# Patient Record
Sex: Female | Born: 1940 | Race: White | Hispanic: No | Smoking: Never smoker
Health system: Southern US, Community
[De-identification: ages and names within clinical notes are randomized; demographics above are authoritative.]

## PROBLEM LIST (undated history)

## (undated) DIAGNOSIS — T7840XA Allergy, unspecified, initial encounter: Secondary | ICD-10-CM

## (undated) DIAGNOSIS — K5790 Diverticulosis of intestine, part unspecified, without perforation or abscess without bleeding: Secondary | ICD-10-CM

## (undated) DIAGNOSIS — R112 Nausea with vomiting, unspecified: Secondary | ICD-10-CM

## (undated) DIAGNOSIS — Z8679 Personal history of other diseases of the circulatory system: Secondary | ICD-10-CM

## (undated) DIAGNOSIS — I10 Essential (primary) hypertension: Secondary | ICD-10-CM

## (undated) DIAGNOSIS — E559 Vitamin D deficiency, unspecified: Secondary | ICD-10-CM

## (undated) DIAGNOSIS — I6529 Occlusion and stenosis of unspecified carotid artery: Secondary | ICD-10-CM

## (undated) DIAGNOSIS — K635 Polyp of colon: Secondary | ICD-10-CM

## (undated) DIAGNOSIS — E039 Hypothyroidism, unspecified: Secondary | ICD-10-CM

## (undated) DIAGNOSIS — M797 Fibromyalgia: Secondary | ICD-10-CM

## (undated) DIAGNOSIS — M351 Other overlap syndromes: Secondary | ICD-10-CM

## (undated) DIAGNOSIS — M3500 Sicca syndrome, unspecified: Secondary | ICD-10-CM

## (undated) DIAGNOSIS — R7689 Other specified abnormal immunological findings in serum: Secondary | ICD-10-CM

## (undated) DIAGNOSIS — M3390 Dermatopolymyositis, unspecified, organ involvement unspecified: Secondary | ICD-10-CM

## (undated) DIAGNOSIS — M25519 Pain in unspecified shoulder: Secondary | ICD-10-CM

## (undated) DIAGNOSIS — R0989 Other specified symptoms and signs involving the circulatory and respiratory systems: Secondary | ICD-10-CM

## (undated) DIAGNOSIS — H409 Unspecified glaucoma: Secondary | ICD-10-CM

## (undated) DIAGNOSIS — K219 Gastro-esophageal reflux disease without esophagitis: Secondary | ICD-10-CM

## (undated) DIAGNOSIS — M3313 Other dermatomyositis without myopathy: Secondary | ICD-10-CM

## (undated) DIAGNOSIS — M349 Systemic sclerosis, unspecified: Secondary | ICD-10-CM

## (undated) DIAGNOSIS — K279 Peptic ulcer, site unspecified, unspecified as acute or chronic, without hemorrhage or perforation: Secondary | ICD-10-CM

## (undated) DIAGNOSIS — G4730 Sleep apnea, unspecified: Secondary | ICD-10-CM

## (undated) DIAGNOSIS — Z8719 Personal history of other diseases of the digestive system: Secondary | ICD-10-CM

## (undated) DIAGNOSIS — J189 Pneumonia, unspecified organism: Secondary | ICD-10-CM

## (undated) DIAGNOSIS — Z8619 Personal history of other infectious and parasitic diseases: Secondary | ICD-10-CM

## (undated) DIAGNOSIS — Z87440 Personal history of urinary (tract) infections: Secondary | ICD-10-CM

## (undated) DIAGNOSIS — I7300 Raynaud's syndrome without gangrene: Secondary | ICD-10-CM

## (undated) DIAGNOSIS — Z8701 Personal history of pneumonia (recurrent): Secondary | ICD-10-CM

## (undated) DIAGNOSIS — Z86718 Personal history of other venous thrombosis and embolism: Secondary | ICD-10-CM

## (undated) DIAGNOSIS — Z9889 Other specified postprocedural states: Secondary | ICD-10-CM

## (undated) DIAGNOSIS — M1990 Unspecified osteoarthritis, unspecified site: Secondary | ICD-10-CM

## (undated) DIAGNOSIS — E119 Type 2 diabetes mellitus without complications: Secondary | ICD-10-CM

## (undated) DIAGNOSIS — Z8489 Family history of other specified conditions: Secondary | ICD-10-CM

## (undated) DIAGNOSIS — I509 Heart failure, unspecified: Secondary | ICD-10-CM

## (undated) DIAGNOSIS — E785 Hyperlipidemia, unspecified: Secondary | ICD-10-CM

## (undated) DIAGNOSIS — R768 Other specified abnormal immunological findings in serum: Secondary | ICD-10-CM

## (undated) HISTORY — DX: Hyperlipidemia, unspecified: E78.5

## (undated) HISTORY — PX: TONSILLECTOMY AND ADENOIDECTOMY: SHX28

## (undated) HISTORY — DX: Allergy, unspecified, initial encounter: T78.40XA

## (undated) HISTORY — DX: Sleep apnea, unspecified: G47.30

## (undated) HISTORY — DX: Hypothyroidism, unspecified: E03.9

## (undated) HISTORY — DX: Peptic ulcer, site unspecified, unspecified as acute or chronic, without hemorrhage or perforation: K27.9

## (undated) HISTORY — DX: Pneumonia, unspecified organism: J18.9

## (undated) HISTORY — PX: JOINT REPLACEMENT: SHX530

## (undated) HISTORY — DX: Pain in unspecified shoulder: M25.519

## (undated) HISTORY — DX: Unspecified glaucoma: H40.9

## (undated) HISTORY — DX: Personal history of pneumonia (recurrent): Z87.01

## (undated) HISTORY — DX: Personal history of other venous thrombosis and embolism: Z86.718

## (undated) HISTORY — DX: Personal history of other infectious and parasitic diseases: Z86.19

## (undated) HISTORY — DX: Personal history of other diseases of the circulatory system: Z86.79

## (undated) HISTORY — DX: Fibromyalgia: M79.7

## (undated) HISTORY — DX: Personal history of other diseases of the digestive system: Z87.19

## (undated) HISTORY — DX: Personal history of urinary (tract) infections: Z87.440

## (undated) HISTORY — DX: Heart failure, unspecified: I50.9

## (undated) HISTORY — DX: Polyp of colon: K63.5

## (undated) HISTORY — DX: Occlusion and stenosis of unspecified carotid artery: I65.29

## (undated) HISTORY — DX: Diverticulosis of intestine, part unspecified, without perforation or abscess without bleeding: K57.90

## (undated) HISTORY — DX: Unspecified osteoarthritis, unspecified site: M19.90

## (undated) HISTORY — DX: Gastro-esophageal reflux disease without esophagitis: K21.9

## (undated) HISTORY — PX: TONSILLECTOMY: SUR1361

## (undated) HISTORY — DX: Other specified symptoms and signs involving the circulatory and respiratory systems: R09.89

## (undated) HISTORY — DX: Type 2 diabetes mellitus without complications: E11.9

## (undated) HISTORY — DX: Vitamin D deficiency, unspecified: E55.9

## (undated) HISTORY — PX: TUBAL LIGATION: SHX77

---

## 1976-09-26 HISTORY — PX: ABDOMINAL HYSTERECTOMY: SHX81

## 1984-09-26 DIAGNOSIS — Z8679 Personal history of other diseases of the circulatory system: Secondary | ICD-10-CM

## 1984-09-26 HISTORY — DX: Personal history of other diseases of the circulatory system: Z86.79

## 1998-09-26 HISTORY — PX: CARDIAC CATHETERIZATION: SHX172

## 2007-03-27 HISTORY — PX: COLONOSCOPY WITH ESOPHAGOGASTRODUODENOSCOPY (EGD): SHX5779

## 2011-09-27 HISTORY — PX: PARTIAL HIP ARTHROPLASTY: SHX733

## 2011-10-28 HISTORY — PX: COLONOSCOPY: SHX174

## 2012-01-06 DIAGNOSIS — R768 Other specified abnormal immunological findings in serum: Secondary | ICD-10-CM | POA: Insufficient documentation

## 2012-02-27 DIAGNOSIS — M545 Low back pain, unspecified: Secondary | ICD-10-CM | POA: Insufficient documentation

## 2012-04-26 LAB — HM DEXA SCAN: HM Dexa Scan: NORMAL

## 2012-08-22 DIAGNOSIS — G4730 Sleep apnea, unspecified: Secondary | ICD-10-CM | POA: Insufficient documentation

## 2012-08-22 DIAGNOSIS — G4733 Obstructive sleep apnea (adult) (pediatric): Secondary | ICD-10-CM | POA: Insufficient documentation

## 2012-09-26 DIAGNOSIS — Z8701 Personal history of pneumonia (recurrent): Secondary | ICD-10-CM

## 2012-09-26 HISTORY — DX: Personal history of pneumonia (recurrent): Z87.01

## 2012-10-17 LAB — HM COLONOSCOPY: HM Colonoscopy: NORMAL

## 2012-11-17 LAB — HM MAMMOGRAPHY: HM MAMMO: NORMAL

## 2014-03-06 LAB — HM DIABETES EYE EXAM

## 2014-07-17 ENCOUNTER — Ambulatory Visit (INDEPENDENT_AMBULATORY_CARE_PROVIDER_SITE_OTHER): Payer: 59 | Admitting: Internal Medicine

## 2014-07-17 ENCOUNTER — Encounter: Payer: Self-pay | Admitting: Internal Medicine

## 2014-07-17 ENCOUNTER — Encounter (INDEPENDENT_AMBULATORY_CARE_PROVIDER_SITE_OTHER): Payer: Self-pay

## 2014-07-17 VITALS — BP 138/62 | HR 89 | Temp 98.0°F | Ht 64.75 in | Wt 161.8 lb

## 2014-07-17 DIAGNOSIS — E039 Hypothyroidism, unspecified: Secondary | ICD-10-CM

## 2014-07-17 DIAGNOSIS — M25511 Pain in right shoulder: Secondary | ICD-10-CM

## 2014-07-17 DIAGNOSIS — E119 Type 2 diabetes mellitus without complications: Secondary | ICD-10-CM

## 2014-07-17 DIAGNOSIS — M79621 Pain in right upper arm: Secondary | ICD-10-CM

## 2014-07-17 DIAGNOSIS — G4730 Sleep apnea, unspecified: Secondary | ICD-10-CM

## 2014-07-17 DIAGNOSIS — F4323 Adjustment disorder with mixed anxiety and depressed mood: Secondary | ICD-10-CM | POA: Insufficient documentation

## 2014-07-17 DIAGNOSIS — Z1239 Encounter for other screening for malignant neoplasm of breast: Secondary | ICD-10-CM

## 2014-07-17 DIAGNOSIS — M329 Systemic lupus erythematosus, unspecified: Secondary | ICD-10-CM

## 2014-07-17 DIAGNOSIS — M545 Low back pain: Secondary | ICD-10-CM

## 2014-07-17 DIAGNOSIS — E063 Autoimmune thyroiditis: Secondary | ICD-10-CM | POA: Insufficient documentation

## 2014-07-17 DIAGNOSIS — M79629 Pain in unspecified upper arm: Secondary | ICD-10-CM | POA: Insufficient documentation

## 2014-07-17 DIAGNOSIS — I10 Essential (primary) hypertension: Secondary | ICD-10-CM

## 2014-07-17 DIAGNOSIS — E038 Other specified hypothyroidism: Secondary | ICD-10-CM | POA: Insufficient documentation

## 2014-07-17 LAB — MICROALBUMIN / CREATININE URINE RATIO
Creatinine,U: 79.7 mg/dL
MICROALB UR: 0.8 mg/dL (ref 0.0–1.9)
Microalb Creat Ratio: 1 mg/g (ref 0.0–30.0)

## 2014-07-17 LAB — HEMOGLOBIN A1C: HEMOGLOBIN A1C: 8 % — AB (ref 4.6–6.5)

## 2014-07-17 LAB — HM PAP SMEAR

## 2014-07-17 NOTE — Assessment & Plan Note (Signed)
Will request notes from [place].

## 2014-07-17 NOTE — Assessment & Plan Note (Signed)
Right axillary pain and fullness. Will set up bilateral diagnostic mammogram and right breast US.

## 2014-07-17 NOTE — Assessment & Plan Note (Signed)
BP Readings from Last 3 Encounters:  07/17/14 138/62   BP well controlled on Diovan. Will continue. Renal function with labs today.

## 2014-07-17 NOTE — Progress Notes (Signed)
Subjective:    Patient ID: Cassandra Benson, female    DOB: 1941/05/01, 73 y.o.   MRN: [id number]  HPI [name] presents to establish care.  DM - Followed at [facility]. Wears insulin pump. Last A1c was 6.2%. Recently had to stopped glucophage for a short time because of diarrhea.  Difficult time for her. Grandson committed suicide. Husband was also recently injured after a fall.  Also concerned about pain under right armpit. Unsure how long present. Hurts to lay arm flat. No nodular areas in breast noted. Last mammogram was 2014 and was normal per pt.  Review of Systems  Constitutional: Negative for fever, chills, appetite change, fatigue and unexpected weight change.  Eyes: Negative for visual disturbance.  Respiratory: Negative for shortness of breath.   Cardiovascular: Negative for chest pain and leg swelling.  Gastrointestinal: Negative for nausea, vomiting, abdominal pain, diarrhea and constipation.  Musculoskeletal: Positive for arthralgias, back pain and myalgias.  Skin: Negative for color change and rash.  Hematological: Negative for adenopathy. Does not bruise/bleed easily.  Psychiatric/Behavioral: Positive for dysphoric mood. Negative for suicidal ideas and sleep disturbance. The patient is not nervous/anxious.        Objective:    BP 138/62  Pulse 89  Temp(Src) 98 F (36.7 C) (Oral)  Ht 5' 4.75" (1.645 m)  Wt 161 lb 12 oz (73.369 kg)  BMI 27.11 kg/m2  SpO2 95% Physical Exam  Constitutional: She is oriented to person, place, and time. She appears well-developed and well-nourished. No distress.  HENT:  Head: Normocephalic and atraumatic.  Right Ear: External ear normal.  Left Ear: External ear normal.  Nose: Nose normal.  Mouth/Throat: Oropharynx is clear and moist. No oropharyngeal exudate.  Eyes: Conjunctivae are normal. Pupils are equal, round, and reactive to light. Right eye exhibits no discharge. Left eye exhibits no discharge. No scleral icterus.  Neck:  Normal range of motion. Neck supple. No tracheal deviation present. No thyromegaly present.  Cardiovascular: Normal rate, regular rhythm, normal heart sounds and intact distal pulses.  Exam reveals no gallop and no friction rub.   No murmur heard. Pulmonary/Chest: Effort normal and breath sounds normal. No accessory muscle usage. Not tachypneic. No respiratory distress. She has no decreased breath sounds. She has no wheezes. She has no rales. She exhibits no tenderness. Right breast exhibits tenderness (right axilla with generalized fullness noted). Right breast exhibits no inverted nipple, no mass, no nipple discharge and no skin change.    Abdominal: Soft. Bowel sounds are normal. She exhibits no distension and no mass. There is no tenderness. There is no rebound and no guarding.  Musculoskeletal: Normal range of motion. She exhibits no edema and no tenderness.  Lymphadenopathy:    She has no cervical adenopathy.  Neurological: She is alert and oriented to person, place, and time. No cranial nerve deficit. She exhibits normal muscle tone. Coordination normal.  Skin: Skin is warm and dry. No rash noted. She is not diaphoretic. No erythema. No pallor.  Psychiatric: She has a normal mood and affect. Her behavior is normal. Judgment and thought content normal.          Assessment & Plan:   Problem List Items Addressed This Visit     Unprioritized   Adjustment disorder with mixed anxiety and depressed mood     Offered support today after recent suicide of her grandson. Encouraged her to consider counseling. She declines for now.    Apnea, sleep     Will request notes from  [facility].    Axillary pain     Right axillary pain and fullness. Will set up bilateral diagnostic mammogram and right breast US.    Relevant Orders      US BREAST LTD UNI RIGHT INC AXILLA      MM Digital Diagnostic Bilat   Diabetes type 2, controlled - Primary     On insulin pump. Previously followed at [facility]. Will  set up evaluation with Dr. Howell Rucks. A1c with labs today.    Relevant Medications      metFORMIN (GLUCOPHAGE XR) 500 MG 24 hr tablet      valsartan (DIOVAN) 40 MG tablet      insulin aspart (NOVOLOG) 100 UNIT/ML injection   Other Relevant Orders      Ambulatory referral to Endocrinology      Comprehensive metabolic panel      Hemoglobin A1c      Lipid panel      Microalbumin / creatinine urine ratio   Disseminated lupus erythematosus     Pt reports h/o SLE. Will request notes on this from [place].    Essential hypertension      BP Readings from Last 3 Encounters:  07/17/14 138/62   BP well controlled on Diovan. Will continue. Renal function with labs today.    Relevant Medications      valsartan (DIOVAN) 40 MG tablet   Hypothyroidism     Check TSH with labs today. Continue Levothyroxine.    Relevant Medications      levothyroxine (LEVOXYL) 75 MCG tablet   Other Relevant Orders      TSH   LBP (low back pain)     Will request notes on previous evaluation from [place].    Screening for breast cancer       Return in about 4 weeks (around 08/14/2014) for Recheck.

## 2014-07-17 NOTE — Patient Instructions (Signed)
We will set up an evaluation with Endocrinology and for your mammogram.  Follow up in 4 weeks.

## 2014-07-17 NOTE — Progress Notes (Signed)
Pre visit review using our clinic review tool, if applicable. No additional management support is needed unless otherwise documented below in the visit note. 

## 2014-07-17 NOTE — Assessment & Plan Note (Signed)
Pt reports h/o SLE. Will request notes on this from [place].

## 2014-07-17 NOTE — Assessment & Plan Note (Signed)
On insulin pump. Previously followed at [facility]. Will set up evaluation with Dr. Howell Rucks. A1c with labs today.

## 2014-07-17 NOTE — Assessment & Plan Note (Signed)
Offered support today after recent suicide of her grandson. Encouraged her to consider counseling. She declines for now.

## 2014-07-17 NOTE — Assessment & Plan Note (Signed)
Check TSH with labs today. Continue Levothyroxine. 

## 2014-07-17 NOTE — Assessment & Plan Note (Signed)
Will request notes on previous evaluation from [place].

## 2014-07-18 ENCOUNTER — Other Ambulatory Visit: Payer: Self-pay | Admitting: *Deleted

## 2014-07-18 ENCOUNTER — Telehealth: Payer: Self-pay | Admitting: Internal Medicine

## 2014-07-18 LAB — COMPREHENSIVE METABOLIC PANEL
ALT: 27 U/L (ref 0–35)
AST: 24 U/L (ref 0–37)
Albumin: 3.5 g/dL (ref 3.5–5.2)
Alkaline Phosphatase: 68 U/L (ref 39–117)
BUN: 11 mg/dL (ref 6–23)
CHLORIDE: 107 meq/L (ref 96–112)
CO2: 21 meq/L (ref 19–32)
Calcium: 9.7 mg/dL (ref 8.4–10.5)
Creatinine, Ser: 0.8 mg/dL (ref 0.4–1.2)
GFR: 72.82 mL/min (ref >60.00)
GLUCOSE: 154 mg/dL — AB (ref 70–99)
Potassium: 4.2 mEq/L (ref 3.5–5.1)
SODIUM: 140 meq/L (ref 135–145)
Total Bilirubin: 0.5 mg/dL (ref 0.2–1.2)
Total Protein: 7.3 g/dL (ref 6.0–8.3)

## 2014-07-18 LAB — LIPID PANEL
Cholesterol: 245 mg/dL — ABNORMAL HIGH (ref 0–200)
HDL: 45.5 mg/dL (ref >39.00)
LDL Cholesterol: 173 mg/dL — ABNORMAL HIGH (ref 0–99)
NonHDL: 199.5
Total CHOL/HDL Ratio: 5
Triglycerides: 134 mg/dL (ref 0.0–149.0)
VLDL: 26.8 mg/dL (ref 0.0–40.0)

## 2014-07-18 LAB — TSH: TSH: 0.65 u[IU]/mL (ref 0.35–4.50)

## 2014-07-18 MED ORDER — LEVOTHYROXINE SODIUM 75 MCG PO TABS: 75.0000 ug | ORAL_TABLET | Freq: Every day | ORAL | Status: DC

## 2014-07-18 NOTE — Telephone Encounter (Signed)
[facility] EMAILED  °

## 2014-07-23 ENCOUNTER — Ambulatory Visit: Payer: 59 | Admitting: Endocrinology

## 2014-07-28 ENCOUNTER — Ambulatory Visit: Payer: Self-pay | Admitting: Internal Medicine

## 2014-07-28 LAB — HM MAMMOGRAPHY: HM MAMMO: NORMAL

## 2014-07-29 ENCOUNTER — Ambulatory Visit: Payer: 59 | Admitting: Endocrinology

## 2014-07-30 ENCOUNTER — Encounter: Payer: Self-pay | Admitting: Endocrinology

## 2014-07-30 ENCOUNTER — Ambulatory Visit (INDEPENDENT_AMBULATORY_CARE_PROVIDER_SITE_OTHER): Payer: 59 | Admitting: Endocrinology

## 2014-07-30 VITALS — BP 124/66 | HR 93 | Resp 14 | Ht 64.75 in | Wt 163.0 lb

## 2014-07-30 DIAGNOSIS — E1169 Type 2 diabetes mellitus with other specified complication: Secondary | ICD-10-CM | POA: Insufficient documentation

## 2014-07-30 DIAGNOSIS — E119 Type 2 diabetes mellitus without complications: Secondary | ICD-10-CM

## 2014-07-30 DIAGNOSIS — E785 Hyperlipidemia, unspecified: Secondary | ICD-10-CM

## 2014-07-30 DIAGNOSIS — I10 Essential (primary) hypertension: Secondary | ICD-10-CM

## 2014-07-30 LAB — HM DIABETES FOOT EXAM: HM Diabetic Foot Exam: NORMAL

## 2014-07-30 MED ORDER — GLUMETZA 500 MG PO TB24: 500.0000 mg | ORAL_TABLET | Freq: Two times a day (BID) | ORAL | Status: DC

## 2014-07-30 MED ORDER — INSULIN ASPART 100 UNIT/ML ~~LOC~~ SOLN: SUBCUTANEOUS | Status: DC

## 2014-07-30 MED ORDER — INSULIN LISPRO 100 UNIT/ML ~~LOC~~ SOLN: SUBCUTANEOUS | Status: DC

## 2014-07-30 NOTE — Progress Notes (Signed)
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 Recalls being initially on lifestyle modifications.  Tried  Metformin, Glipizide. She has been on insulin 2005-2007. Was on insulin pump since then, using medtronic pump. Recently got a replacement pump. Was being followed by Dr Helene Kelp and and then Dr Janese Banks at [name]. Moved from [place] to locally this June.   Didn't tolerate  Metformin this June 2015 due to diarrhea. Notices better sugars with metformin use, and has restarted at lower dose.   Pt is currently on a regimen of: - Metformin XR 500 mg po qhs -  Novolog ~50 units per day in a insulin pump  Pump information is as follows- Average total daily insulin is split 63%-37%, basal-bolus. BG readings average per day. 2.9 24-hour basal total is 23.8 units. Midnight 0.8, 3 AM 0.8, 9:30 AM 1, 1630 p.m. 1.35, 1830 p.m. 1.20, 23:30 PM 1 insulin carbohydrate ratio is 10 at midnight,and 8 at 1800 Insulin sensitivity 35 Target blood sugar levels-midnight 130,6 AM 100, 8 AM 130, 11:30 AM 100, 1400 p.m. 130, 1800 p.m. 100 Active insulin time 4 hours   Last hemoglobin A1c was: Lab Results  Component Value Date   HGBA1C 8.0* 07/17/2014   Prior A1c per her reports have been well controlled <7%. Last few months have been emotional, and she is helping daughter deal with her divorce, recently her grandson committed suicide, and her uncle passed away. These past months have been very stressful.   Pt checks her sugars 2-4 a day . Used to check more often in the past.  Uses Bayer contour next glucometer. By pump download they are:  PREMEAL Breakfast Lunch Dinner Bedtime Overall  Glucose range: 11-180 126-161 140-200 114-290   Mean/median:        POST-MEAL PC Breakfast PC Lunch  PC Dinner  Glucose range:     Mean/median:       Hypoglycemia-  No lows. she has hypoglycemia awareness at 87.   Dietary habits- eats three times daily. Tries to limit carbs, sweetened beverages, sodas, desserts. Comfortable with carb counting. Lately has been bolusing based on Carb calculations only, not factoring in FS premeals.  Exercise- not recently. Now going to start walking.  Weight -  Wt Readings from Last 3 Encounters:  07/30/14 163 lb (73.936 kg)  07/17/14 161 lb 12 oz (73.369 kg)    Diabetes Complications-  Nephropathy- No, ? Stage 2  CKD, last BUN/creatinine- GFR  Lab Results  Component Value Date   BUN 11 07/17/2014   CREATININE 0.8 07/17/2014   Lab Results  Component Value Date   GFR 72.82 07/17/2014   MICRALBCREAT 1.0 07/17/2014    Retinopathy- No, Last DEE was in June 2015 Neuropathy- no numbness and tingling in her feet. No known neuropathy.  Associated history - No CAD . No prior stroke. Has hypothyroidism. her last TSH was  Lab Results  Component Value Date   TSH 0.65 07/17/2014  Being treated with levothyroxine.   Hyperlipidemia-  her last set of lipids were- Currently on dietary therapy. Tolerating well.  Zetia caused diarrhea. Relatives not tolerating statins, and hence patient doesn't wish to start statin therapy. Understands the risks.  Lab Results  Component Value Date   CHOL 245* 07/17/2014   HDL 45.50 07/17/2014  D'Lo 173* 07/17/2014   TRIG 134.0 07/17/2014   CHOLHDL 5 07/17/2014    Blood Pressure/HTN- Patient's blood pressure is well controlled today on current regimen that includes ARB(diovan) .  Pt has FH of DM in siblings. FH of thyroid disease sisters. Mother had graves's disease.  I have reviewed the patient's past medical history, family and social history, surgical history, medications and allergies.  Past Medical History  Diagnosis Date  . Arthritis   . History of chicken pox   . History of shingles   . Diabetes  mellitus without complication   . History of diverticulitis   . Glaucoma     Narrow angle  . Allergy   . Hyperlipidemia   . Thyroid disease   . History of UTI   . History of blood clots     DVT, in 20s, none since   Past Surgical History  Procedure Laterality Date  . Tonsillectomy and adenoidectomy    . Partial hip arthroplasty  2013    Right hip replacement  . Abdominal hysterectomy      menorrhagia  . Vaginal delivery      x2, no complications   History   Social History  . Marital Status: Married    Spouse Name: N/A    Number of Children: N/A  . Years of Education: N/A   Occupational History  . Not on file.   Social History Main Topics  . Smoking status: Never Smoker   . Smokeless tobacco: Not on file  . Alcohol Use: No  . Drug Use: No  . Sexual Activity: Not on file   Other Topics Concern  . Not on file   Social History Narrative   Lives in [place] now. Recently moved from [place]. Lives with husband.   No pets.      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 albuterol (VENTOLIN HFA) 108 (90 BASE) MCG/ACT inhaler Inhale into the lungs.    . Cholecalciferol 50000 UNITS TABS Take 50,000 mg by mouth. Take one tablet by mouth every 14 days (2 weeks)    . Cyanocobalamin (B-12 SL) Place under the tongue.    Marland Kitchen levothyroxine (LEVOXYL) 75 MCG tablet Take 1 tablet (75 mcg total) by mouth daily before breakfast. Take 1 tab 6 days a week and 1.5 tabs 1 day a week. 32 tablet 3  . valsartan (DIOVAN) 40 MG tablet Take 40 mg by mouth. One tablet every morning     No current facility-administered medications on file prior to visit.   Allergies  Allergen Reactions  . Codeine Nausea Only  . Influenza Vaccines     Muscle weakness  . Valsartan     Allergy  to generic only Other reaction(s): Cough "Hacking" cough  . Erythromycin Rash and Swelling  . Penicillins Rash and Swelling   Family History  Problem Relation Age of Onset  . Heart disease Mother     aortic valve issues  . COPD Mother   . Heart disease Father     CABG  . Diabetes Sister   . Stroke Sister   . Alcohol abuse Brother   . Heart disease Brother   . Stroke Brother   . Seizures Son   .  COPD Brother   . Heart disease Brother   . Diabetes Brother   . Depression Grandchild   . Cancer Maternal Aunt     breast      Review of Systems: [x]  complains of  [  ] denies General:   [  ] Recent weight change [  ] Fatigue  [  ] Loss of appetite Eyes: [  ]  Vision Difficulty [  ]  Eye pain ENT: [  ]  Hearing difficulty [  ]  Difficulty Swallowing CVS: [  ] Chest pain [  ]  Palpitations/Irregular Heart beat [  ]  Shortness of breath lying flat [  ] Swelling of legs Resp: [  ] Frequent Cough [  ] Shortness of Breath  [  ]  Wheezing GI: [  ] Heartburn  [  ] Nausea or Vomiting  [x  ] Diarrhea [  ] Constipation  [  ] Abdominal Pain GU: [  ]  Polyuria  [  ]  nocturia Bones/joints:  [ x ]  Muscle aches  [ x ] Joint Pain  [ x ] Bone pain Skin/Hair/Nails: [  ]  Rash  [  ] New stretch marks [  ]  Itching [  ] Hair loss [ x ]  Excessive hair growth Reproduction: [  ] Low sexual desire , [  ]  Women: Menstrual cycle problems [  ]  Women: Breast Discharge [  ] Men: Difficulty with erections [  ]  Men: Enlarged Breasts CNS: [  ] Frequent Headaches [  ] Blurry vision [  ] Tremors [  ] Seizures [  ] Loss of consciousness [  ] Localized weakness Endocrine: [  ]  Excess thirst [  ]  Feeling excessively hot [  ]  Feeling excessively cold Heme: [  ]  Easy bruising [  ]  Enlarged glands or lumps in neck Allergy: [  ]  Food allergies [  ] Environmental allergies   PE: BP 124/66 mmHg  Pulse 93  Resp 14  Ht 5' 4.75" (1.645 m)  Wt 163 lb (73.936 kg)  BMI 27.32 kg/m2  SpO2 97% Wt Readings from  Last 3 Encounters:  07/30/14 163 lb (73.936 kg)  07/17/14 161 lb 12 oz (73.369 kg)   GENERAL: No acute distress, well developed HEENT:  Eye exam shows normal external appearance. Oral exam shows normal mucosa .  NECK:   Neck exam shows no lymphadenopathy. No Carotids bruits. Thyroid is not enlarged and no nodules felt.  no acanthosis nigricans LUNGS:         Chest is symmetrical. Lungs are clear to auscultation.Marland Kitchen   HEART:         Heart sounds:  S1 and S2 are normal. No murmurs or clicks heard. ABDOMEN:  No Distention present. Liver and spleen are not palpable. No other mass or tenderness present.  EXTREMITIES:     There is no edema. 2+ DP pulses  NEUROLOGICAL:     Grossly intact.            Diabetic foot exam done with shoes and socks removed: Normal Monofilament testing bilaterally. No deformities of Bunions at great toes.  Nails  Not dystrophic. Skin normal color. No open wounds. Dry skin. Few callosities MUSCULOSKELETAL:       There is no enlargement or gross deformity of the joints.  SKIN:       No rash  ASSESSMENT AND PLAN: Problem List Items  Addressed This Visit      Cardiovascular and Mediastinum   Essential hypertension     At goal on current therapy. Urine MA negative October 2015.       Endocrine   Diabetes type 2, controlled - Primary    We discussed role of stress in DM control.  Overall, she has done fairly well till recently with her DM control.   Lately, hasn't been using the bolus wizard like she should. I have asked her to check her sugars 4 x daily and use her bolus wizard for accurate bolus calculations.   Her morning sugars seem little higher and this could be related to [facility] phenomenon. Will try Glumetza to see if she tolerates this form of metformin better. May need to do a PA for this.   For now, no changes to her insulin pump settings.  RTC 1 month.     Relevant Medications      GLUMETZA 500 MG 24 hr tablet      INSULIN ASPART 100 UNIT/ML [place] SOLN      Other   Hyperlipidemia    She has uncontrolled lipid levels. Declined use of statin. Understands risks. Did not tolerate zetia before due to diarrhea.          - Return to clinic in 1 mo with sugar log/meter.  Amed Datta [facility] 07/30/2014 3:14 PM

## 2014-07-30 NOTE — Assessment & Plan Note (Signed)
At goal on current therapy. Urine MA negative October 2015.

## 2014-07-30 NOTE — Assessment & Plan Note (Signed)
She has uncontrolled lipid levels. Declined use of statin. Understands risks. Did not tolerate zetia before due to diarrhea.

## 2014-07-30 NOTE — Assessment & Plan Note (Signed)
We discussed role of stress in DM control.  Overall, she has done fairly well till recently with her DM control.   Lately, hasn't been using the bolus wizard like she should. I have asked her to check her sugars 4 x daily and use her bolus wizard for accurate bolus calculations.   Her morning sugars seem little higher and this could be related to [facility] phenomenon. Will try Glumetza to see if she tolerates this form of metformin better. May need to do a PA for this.   For now, no changes to her insulin pump settings.  RTC 1 month.

## 2014-07-30 NOTE — Progress Notes (Signed)
Pre visit review using our clinic review tool, if applicable. No additional management support is needed unless otherwise documented below in the visit note. 

## 2014-07-30 NOTE — Patient Instructions (Signed)
Start Glumetza instead of metformin at 500mg  twice daily.   Start checking sugars prior to each meal and  Bolus per calculations.   Please come back for a follow-up appointment in 1 month.

## 2014-08-26 ENCOUNTER — Ambulatory Visit: Payer: 59 | Admitting: Internal Medicine

## 2014-08-26 ENCOUNTER — Ambulatory Visit: Payer: 59 | Admitting: Endocrinology

## 2014-09-02 ENCOUNTER — Encounter: Payer: Self-pay | Admitting: Internal Medicine

## 2014-09-02 ENCOUNTER — Ambulatory Visit (INDEPENDENT_AMBULATORY_CARE_PROVIDER_SITE_OTHER): Payer: 59 | Admitting: Endocrinology

## 2014-09-02 ENCOUNTER — Encounter: Payer: Self-pay | Admitting: Endocrinology

## 2014-09-02 VITALS — BP 126/81 | HR 89 | Temp 97.4°F | Resp 16 | Wt 161.0 lb

## 2014-09-02 DIAGNOSIS — I10 Essential (primary) hypertension: Secondary | ICD-10-CM

## 2014-09-02 DIAGNOSIS — L680 Hirsutism: Secondary | ICD-10-CM

## 2014-09-02 DIAGNOSIS — E119 Type 2 diabetes mellitus without complications: Secondary | ICD-10-CM

## 2014-09-02 DIAGNOSIS — L709 Acne, unspecified: Secondary | ICD-10-CM

## 2014-09-02 MED ORDER — BD ULTRA-FINE LANCETS MISC: Status: DC

## 2014-09-02 NOTE — Progress Notes (Signed)
Pre visit review using our clinic review tool, if applicable. No additional management support is needed unless otherwise documented below in the visit note. 

## 2014-09-02 NOTE — Patient Instructions (Signed)
Continue current pump settings for now.  Evaluate for excess hair growth and acne.   Please come back for a follow-up appointment in 2 months.

## 2014-09-02 NOTE — Progress Notes (Signed)
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 Recalls being initially on lifestyle modifications.  Tried  Metformin, Glipizide. She has been on insulin 2005-2007. Was on insulin pump since then, using medtronic pump. Recently got a replacement pump. Was being followed by Dr Helene Kelp and and then Dr Janese Banks at [facility]. Moved from [place] to locally this June. Last visit 1 month ago.  Didn't tolerate higher dose Metformin this June 2015 due to diarrhea. Notices better sugars with metformin use, and has restarted at lower dose and titrated up.  *Glumetza was too expensive- hasn't tried  Pt is currently on a regimen of: - Metformin XR 500 mg one- three times daily- increased since last time per patient- takes it according to her sugars-tolerating relatively well with occasional diarrhea -  Novolog ~50 units per day in a insulin pump  Pump information is as follows- Average total daily insulin is split 63%-37%-47%, basal-bolus. BG readings average per day. 2.9 24-hour basal total is 23.8 units. Midnight 0.8, 3 AM 0.8, 9:30 AM 1, 1630 p.m. 1.35, 1830 p.m. 1.20, 23:30 PM 1.3 insulin carbohydrate ratio is 10 at midnight,and 8 at 1800 Insulin sensitivity 35 Target blood sugar levels-midnight 130,6 AM 100, 8 AM 130, 11:30 AM 100, 1400 p.m. 130, 1800 p.m. 100 Active insulin time 4 hours   Last hemoglobin A1c was: Lab Results  Component Value Date   HGBA1C 8.0* 07/17/2014   Prior A1c per her reports have been well controlled <7%. Last few months have been emotional, and she is helping daughter deal with her divorce, recently her grandson committed suicide, and her uncle passed away. These past months have been very stressful. Now getting back to her schedule.  Pt checks her sugars 2-4 a day . Used to check  more often in the past.  Uses Bayer contour next glucometer. By pump download they are:  PREMEAL Breakfast Lunch Dinner Bedtime Overall  Glucose range:    Higher readings occasionally 72-130s mostly  Mean/median:        POST-MEAL PC Breakfast PC Lunch PC Dinner  Glucose range:     Mean/median:       Hypoglycemia-  No lows. she has hypoglycemia awareness at 21.   Dietary habits- eats three times daily. Tries to limit carbs, sweetened beverages, sodas, desserts. Comfortable with carb counting. Back to using the bolus wizard.   Exercise-  Now  Walking for exercise.  Weight -  Wt Readings from Last 3 Encounters:  09/02/14 161 lb (73.029 kg)  07/30/14 163 lb (73.936 kg)  07/17/14 161 lb 12 oz (73.369 kg)    Diabetes Complications-  Nephropathy- No, ? Stage 2  CKD, last BUN/creatinine-  Lab Results  Component Value Date   BUN 11 07/17/2014   CREATININE 0.8 07/17/2014   Lab Results  Component Value Date   GFR 72.82 07/17/2014   MICRALBCREAT 1.0 07/17/2014    Retinopathy- No, Last DEE was in June 2015 Neuropathy- no numbness and tingling in her feet. No known neuropathy.  Associated history - No CAD . No prior stroke. Has hypothyroidism. her last TSH was  Lab Results  Component Value Date   TSH 0.65 07/17/2014  Being treated with levothyroxine.   Hyperlipidemia-  her last set of lipids were- Currently on dietary therapy. Tolerating well.  Zetia caused diarrhea.  Relatives not tolerating statins, and hence patient doesn't wish to start statin therapy. Understands the risks.  Lab Results  Component Value Date   CHOL 245* 07/17/2014   HDL 45.50 07/17/2014   LDLCALC 173* 07/17/2014   TRIG 134.0 07/17/2014   CHOLHDL 5 07/17/2014    Blood Pressure/HTN- Patient's blood pressure is well controlled today on current regimen that includes ARB(diovan) .  Hirsutism and Acne- has been noticing prominence of terminal hair over chin and face over past 6-7 months along with acne. Had  hysterectomy in her 30s due to fibroid. Prior periods were irregular. Denies recent steroids use. Denies any abnormal stretch marks. Notices prominence of skin in her pubic area. Wants to get hormone levels checked.    I have reviewed the patient's past medical history,  medications and allergies.   Current Outpatient Prescriptions on File Prior to Visit  Medication Sig Dispense Refill  . Cyanocobalamin (B-12 SL) Place under the tongue.    Marland Kitchen GLUMETZA 500 MG 24 hr tablet Take 1 tablet (500 mg total) by mouth 2 (two) times daily with a meal. 60 tablet 4  . insulin aspart (NOVOLOG) 100 UNIT/ML injection Use in an insulin pump upto 50 units per day 2 vial 6  . levothyroxine (LEVOXYL) 75 MCG tablet Take 1 tablet (75 mcg total) by mouth daily before breakfast. Take 1 tab 6 days a week and 1.5 tabs 1 day a week. 32 tablet 3  . valsartan (DIOVAN) 40 MG tablet Take 40 mg by mouth. One tablet every morning    . albuterol (VENTOLIN HFA) 108 (90 BASE) MCG/ACT inhaler Inhale into the lungs.    . Cholecalciferol 50000 UNITS TABS Take 50,000 mg by mouth. Take one tablet by mouth every 14 days (2 weeks)     No current facility-administered medications on file prior to visit.   Allergies  Allergen Reactions  . Codeine Nausea Only  . Influenza Vaccines     Muscle weakness  . Valsartan     Allergy to generic only Other reaction(s): Cough "Hacking" cough  . Erythromycin Rash and Swelling  . Penicillins Rash and Swelling    Review of Systems- [ x ]  Complains of    [  ]  denies [  ] Recent weight change [  ]  Fatigue [  ] polydipsia [x  ] polyuria [  ]  nocturia [  ]  vision difficulty [x  ] chest soreness fibromyalgias [  ] shortness of breath [  ] leg swelling [ x ] cough [ x ] nausea/ no vomiting [ x ] diarrhea [  ] constipation [  ] abdominal pain [  ]  tingling/numbness in extremities [  ]  concern with feet ( wounds/sores)    PE: BP 126/81 mmHg  Pulse 89  Temp(Src) 97.4 F (36.3 C) (Oral)   Resp 16  Wt 161 lb (73.029 kg)  SpO2 98% Wt Readings from Last 3 Encounters:  09/02/14 161 lb (73.029 kg)  07/30/14 163 lb (73.936 kg)  07/17/14 161 lb 12 oz (73.369 kg)   HEENT: [place]/AT, EOMI, no icterus, no proptosis, no chemosis, no mild lid lag, no retraction, eyes close completely, mild-moderate acne, no overt hirsutism Neck: thyroid gland - smooth, non-tender, no erythema, no tracheal deviation; negative Pemberton's sign; no lymphadenopathy; no bruits Lungs: good air entry, clear bilaterally Heart: S1&S2 normal, regular rate & rhythm; no murmurs, rubs or gallops Abd: soft, NT, ND, no HSM, +BS, no abnormal straie  ASSESSMENT AND PLAN: Problem List Items Addressed This Visit      Cardiovascular and Mediastinum   Essential hypertension     At goal on current therapy. Urine MA negative October 2015.         Endocrine   Diabetes type 2, controlled - Primary     Overall, she has done fairly well till recently with her DM control.  Restarted using the bolus wizard- encouraged her to do so every time.  Asked her not to titrate the metformin dose and stay at 500 mg twice daily.  Check sugars 4-6 x daily.  Occasional night time sugars are high likely related to dietary indiscretions. Continue current pump settings but work on dietary modifications.    RTC 2 month.       Relevant Medications      BD ULTRA-FINE LANCETS lancets    Other Visit Diagnoses    Acne, unspecified acne type        Relevant Orders       Testosterone,Free and Total       DHEA-sulfate (Completed)    Hirsutism        Relevant Orders       Testosterone,Free and Total       DHEA-sulfate (Completed)       Re: Acne and Hirsutism- discussed about possible role of premenopausal PCOS given her history of irreg periods, but also would evaluate with DHEAS and Testosterone levels given relatively newer onset of symptoms. Acne could be stress related as well. Further evaluation based on lab results.   -  Return to clinic in 2 mo with sugar log/meter.  Endy Easterly [facility] 09/03/2014 9:13 AM

## 2014-09-03 ENCOUNTER — Encounter: Payer: Self-pay | Admitting: *Deleted

## 2014-09-03 LAB — DHEA-SULFATE: DHEA-SO4: 49 ug/dL (ref 7–177)

## 2014-09-03 LAB — TESTOSTERONE,FREE AND TOTAL
TESTOSTERONE: 59 ng/dL — AB (ref 3–41)
Testosterone, Free: 2.1 pg/mL (ref 0.0–4.2)

## 2014-09-03 NOTE — Assessment & Plan Note (Signed)
  Overall, she has done fairly well till recently with her DM control.  Restarted using the bolus wizard- encouraged her to do so every time.  Asked her not to titrate the metformin dose and stay at 500 mg twice daily.  Check sugars 4-6 x daily.  Occasional night time sugars are high likely related to dietary indiscretions. Continue current pump settings but work on dietary modifications.    RTC 2 month.

## 2014-09-03 NOTE — Assessment & Plan Note (Signed)
At goal on current therapy. Urine MA negative October 2015.

## 2014-09-04 ENCOUNTER — Encounter: Payer: Self-pay | Admitting: *Deleted

## 2014-09-04 ENCOUNTER — Other Ambulatory Visit: Payer: Self-pay | Admitting: Endocrinology

## 2014-09-04 DIAGNOSIS — R7989 Other specified abnormal findings of blood chemistry: Secondary | ICD-10-CM

## 2014-09-04 MED ORDER — DEXAMETHASONE 1 MG PO TABS: 1.0000 mg | ORAL_TABLET | Freq: Once | ORAL | Status: DC

## 2014-09-08 ENCOUNTER — Other Ambulatory Visit (INDEPENDENT_AMBULATORY_CARE_PROVIDER_SITE_OTHER): Payer: 59

## 2014-09-08 DIAGNOSIS — E349 Endocrine disorder, unspecified: Secondary | ICD-10-CM

## 2014-09-08 DIAGNOSIS — R7989 Other specified abnormal findings of blood chemistry: Secondary | ICD-10-CM

## 2014-09-08 LAB — CORTISOL: Cortisol, Plasma: 1.3 ug/dL

## 2014-09-10 ENCOUNTER — Other Ambulatory Visit: Payer: Self-pay | Admitting: Endocrinology

## 2014-09-10 DIAGNOSIS — R7989 Other specified abnormal findings of blood chemistry: Secondary | ICD-10-CM

## 2014-09-10 LAB — FSH/LH
FSH: 68.6 m[IU]/mL
LH: 43.4 m[IU]/mL

## 2014-09-10 LAB — ESTRADIOL: Estradiol: 24 pg/mL

## 2014-09-11 ENCOUNTER — Ambulatory Visit: Payer: Self-pay | Admitting: Endocrinology

## 2014-09-12 ENCOUNTER — Telehealth: Payer: Self-pay

## 2014-09-12 NOTE — Telephone Encounter (Signed)
Please let her know that the [place] report did not visualize the ovaries well, but they looked normal. I dont think she has Right abdominal pain, correct? If she does, then would defer further workup to her PCP.  So far it looks like PCOS is causing the hair growth. Continue to follow along on metformin therapy.

## 2014-09-12 NOTE — Telephone Encounter (Signed)
Spoke to patient to notify her of Dr. Boyd Kerbs comments. Patient verbalized understanding. Patient stated that she is having Right abdominal pain and she will follow up with PCP regarding those symptoms.

## 2014-09-12 NOTE — Telephone Encounter (Signed)
Ultrasound Impression: Status post Hysterectomy. Bilateral ovaries are poorly visualized but grossly unremarkable.  Suspected loop of bowel adjacent to the right ovary, although poorly visualized/evaluated. In the setting of right lower quadrant pain consider CT with contrast for further evaluation.

## 2014-09-18 ENCOUNTER — Encounter: Payer: Self-pay | Admitting: Internal Medicine

## 2014-09-23 ENCOUNTER — Encounter: Payer: Self-pay | Admitting: *Deleted

## 2014-09-23 ENCOUNTER — Ambulatory Visit (INDEPENDENT_AMBULATORY_CARE_PROVIDER_SITE_OTHER): Payer: 59 | Admitting: Internal Medicine

## 2014-09-23 ENCOUNTER — Encounter: Payer: Self-pay | Admitting: Internal Medicine

## 2014-09-23 VITALS — BP 136/70 | HR 80 | Temp 97.9°F | Ht 64.75 in | Wt 162.5 lb

## 2014-09-23 DIAGNOSIS — Z5329 Procedure and treatment not carried out because of patient's decision for other reasons: Secondary | ICD-10-CM

## 2014-09-23 DIAGNOSIS — I10 Essential (primary) hypertension: Secondary | ICD-10-CM

## 2014-09-23 DIAGNOSIS — Z23 Encounter for immunization: Secondary | ICD-10-CM

## 2014-09-23 DIAGNOSIS — E119 Type 2 diabetes mellitus without complications: Secondary | ICD-10-CM

## 2014-09-23 DIAGNOSIS — M329 Systemic lupus erythematosus, unspecified: Secondary | ICD-10-CM

## 2014-09-23 DIAGNOSIS — F4323 Adjustment disorder with mixed anxiety and depressed mood: Secondary | ICD-10-CM

## 2014-09-23 DIAGNOSIS — Z5320 Procedure and treatment not carried out because of patient's decision for unspecified reasons: Secondary | ICD-10-CM

## 2014-09-23 NOTE — Assessment & Plan Note (Signed)
Coping well with ongoing family issues. Will continue to monitor.

## 2014-09-23 NOTE — Assessment & Plan Note (Signed)
BG well controlled. Will check A1c with labs in 09/2014.

## 2014-09-23 NOTE — Assessment & Plan Note (Signed)
Reviewed notes from [facility]. On Plaquenil in the past, however not currently on medication. Symptomatically doing well. Will follow.

## 2014-09-23 NOTE — Progress Notes (Signed)
Pre visit review using our clinic review tool, if applicable. No additional management support is needed unless otherwise documented below in the visit note. 

## 2014-09-23 NOTE — Assessment & Plan Note (Signed)
BP Readings from Last 3 Encounters:  09/23/14 136/70  09/02/14 126/81  07/30/14 124/66   BP well controlled. Continue current medication. We discussed change to Losartan when she is out of Diovan, as her insurance will no longer cover Diovan.

## 2014-09-23 NOTE — Patient Instructions (Addendum)
Prevnar vaccine today.  Let us know when you are low on Diovan and we will try changing to Losartan daily.  Labs in 4 weeks.  Follow up in 4 weeks.

## 2014-09-23 NOTE — Progress Notes (Signed)
Subjective:    Patient ID: Cassandra Benson, female    DOB: 06/08/1941, 73 y.o.   MRN: [id number]  HPI [name] presents for follow up visit.  DM - Recently seen by Dr. Howell Rucks. No changes made to medications. BG have been controlled, near 90-110s. Rare to see higher than 150.  Continues to be a difficult time for her with her daughter's ongoing marital issues. She feels she is coping well.  Past medical, surgical, family and social history per today's encounter.  Review of Systems  Constitutional: Negative for fever, chills, appetite change, fatigue and unexpected weight change.  Eyes: Negative for visual disturbance.  Respiratory: Negative for shortness of breath.   Cardiovascular: Negative for chest pain, palpitations and leg swelling.  Gastrointestinal: Negative for nausea, vomiting, abdominal pain, diarrhea and constipation.  Musculoskeletal: Negative for myalgias and arthralgias.  Skin: Negative for color change and rash.  Hematological: Negative for adenopathy. Does not bruise/bleed easily.  Psychiatric/Behavioral: Positive for dysphoric mood. Negative for suicidal ideas and sleep disturbance. The patient is nervous/anxious.        Objective:    BP 136/70 mmHg  Pulse 80  Temp(Src) 97.9 F (36.6 C) (Oral)  Ht 5' 4.75" (1.645 m)  Wt 162 lb 8 oz (73.71 kg)  BMI 27.24 kg/m2  SpO2 97% Physical Exam  Constitutional: She is oriented to person, place, and time. She appears well-developed and well-nourished. No distress.  HENT:  Head: Normocephalic and atraumatic.  Right Ear: External ear normal.  Left Ear: External ear normal.  Nose: Nose normal.  Mouth/Throat: Oropharynx is clear and moist. No oropharyngeal exudate.  Eyes: Conjunctivae are normal. Pupils are equal, round, and reactive to light. Right eye exhibits no discharge. Left eye exhibits no discharge. No scleral icterus.  Neck: Normal range of motion. Neck supple. No tracheal deviation present. No thyromegaly  present.  Cardiovascular: Normal rate, regular rhythm, normal heart sounds and intact distal pulses.  Exam reveals no gallop and no friction rub.   No murmur heard. Pulmonary/Chest: Effort normal and breath sounds normal. No accessory muscle usage. No tachypnea. No respiratory distress. She has no decreased breath sounds. She has no wheezes. She has no rhonchi. She has no rales. She exhibits no tenderness.  Musculoskeletal: Normal range of motion. She exhibits no edema or tenderness.  Lymphadenopathy:    She has no cervical adenopathy.  Neurological: She is alert and oriented to person, place, and time. No cranial nerve deficit. She exhibits normal muscle tone. Coordination normal.  Skin: Skin is warm and dry. No rash noted. She is not diaphoretic. No erythema. No pallor.  Psychiatric: She has a normal mood and affect. Her behavior is normal. Judgment and thought content normal.          Assessment & Plan:   Problem List Items Addressed This Visit      Unprioritized   Adjustment disorder with mixed anxiety and depressed mood    Coping well with ongoing family issues. Will continue to monitor.    Diabetes type 2, controlled - Primary    BG well controlled. Will check A1c with labs in 09/2014.    Relevant Medications      metFORMIN (GLUCOPHAGE) 500 MG tablet   Other Relevant Orders      Comprehensive metabolic panel      Hemoglobin A1c      Lipid panel      Microalbumin / creatinine urine ratio   Disseminated lupus erythematosus    Reviewed notes from  [facility]. On Plaquenil in the past, however not currently on medication. Symptomatically doing well. Will follow.    Essential hypertension    BP Readings from Last 3 Encounters:  09/23/14 136/70  09/02/14 126/81  07/30/14 124/66   BP well controlled. Continue current medication. We discussed change to Losartan when she is out of Diovan, as her insurance will no longer cover Diovan.    Refusal of statin medication by  patient    Other Visit Diagnoses    Need for vaccination with 13-polyvalent pneumococcal conjugate vaccine        Relevant Orders       Pneumococcal conjugate vaccine 13-valent (Completed)        Return in about 4 weeks (around 10/21/2014) for Recheck of Diabetes.

## 2014-09-24 ENCOUNTER — Encounter: Payer: Self-pay | Admitting: Internal Medicine

## 2014-09-25 ENCOUNTER — Encounter: Payer: Self-pay | Admitting: Internal Medicine

## 2014-09-25 ENCOUNTER — Ambulatory Visit (INDEPENDENT_AMBULATORY_CARE_PROVIDER_SITE_OTHER): Payer: 59 | Admitting: Internal Medicine

## 2014-09-25 ENCOUNTER — Ambulatory Visit: Payer: Self-pay | Admitting: Internal Medicine

## 2014-09-25 ENCOUNTER — Telehealth: Payer: Self-pay | Admitting: Internal Medicine

## 2014-09-25 VITALS — BP 126/68 | HR 71 | Temp 98.1°F | Ht 64.75 in | Wt 162.5 lb

## 2014-09-25 DIAGNOSIS — J069 Acute upper respiratory infection, unspecified: Secondary | ICD-10-CM | POA: Insufficient documentation

## 2014-09-25 DIAGNOSIS — M545 Low back pain, unspecified: Secondary | ICD-10-CM

## 2014-09-25 DIAGNOSIS — B9789 Other viral agents as the cause of diseases classified elsewhere: Secondary | ICD-10-CM

## 2014-09-25 MED ORDER — TRAMADOL HCL 50 MG PO TABS: 50.0000 mg | ORAL_TABLET | Freq: Three times a day (TID) | ORAL | Status: DC | PRN

## 2014-09-25 NOTE — Progress Notes (Signed)
Pre visit review using our clinic review tool, if applicable. No additional management support is needed unless otherwise documented below in the visit note. 

## 2014-09-25 NOTE — Assessment & Plan Note (Signed)
Severe midline low back pain with sudden onset. Exam is remarkable for extreme tenderness with very light touch. No overlying skin changes to suggest Herpes zoster or other infectious cause. No history of trauma to suggest vertebral fracture. However, will check plain xray to evaluate further. No neurologic findings such as numbness or weakness to suggest cord compression. Start Tramadol prn pain. If symptoms worsen over the weekend, or are not controlled with Tramadol, then she will need to be evaluated in the ED.

## 2014-09-25 NOTE — Progress Notes (Signed)
Subjective:    Patient ID: Cassandra Benson, female    DOB: September 07, 1941, 72 y.o.   MRN: [id number]  HPI [name] presents for acute visit.  Developed mid low back pain starting yesterday. Described as severe, aching or burning. Made worse by any movement. Pain does not radiate. Not taking anything for pain, except for OTC Ibuprofen 600mg  tid which has not been helpful. No rash noted. No heavy lifting or trauma to back. No fever, chills. She is concerned this may be related to recent Prevnar vaccine. She had severe reaction to Swine Flu vaccine in the past and developed pain and weakness. She denies any numbness or weakness at this time. No loss of bowel or bladder.  Also developed non-productive cough starting today with nasal congestion. Husband has been sick with similar symptoms. No dyspnea, chest pain, fever, chills.   Past medical, surgical, family and social history per today's encounter.  Review of Systems  Constitutional: Negative for fever, chills, appetite change, fatigue and unexpected weight change.  HENT: Positive for congestion. Negative for sinus pressure, sneezing, sore throat, trouble swallowing and voice change.   Eyes: Negative for visual disturbance.  Respiratory: Positive for cough. Negative for chest tightness and shortness of breath.   Cardiovascular: Negative for chest pain and leg swelling.  Gastrointestinal: Negative for abdominal pain.  Musculoskeletal: Positive for myalgias and arthralgias.  Skin: Negative for color change, rash and wound.  Neurological: Negative for dizziness, weakness, light-headedness, numbness and headaches.  Hematological: Negative for adenopathy. Does not bruise/bleed easily.  Psychiatric/Behavioral: Positive for sleep disturbance. Negative for dysphoric mood. The patient is not nervous/anxious.        Objective:    BP 126/68 mmHg  Pulse 71  Temp(Src) 98.1 F (36.7 C) (Oral)  Ht 5' 4.75" (1.645 m)  Wt 162 lb 8 oz (73.71 kg)   BMI 27.24 kg/m2  SpO2 96% Physical Exam  Constitutional: She is oriented to person, place, and time. She appears well-developed and well-nourished. No distress.  HENT:  Head: Normocephalic and atraumatic.  Right Ear: External ear normal.  Left Ear: External ear normal.  Nose: Nose normal.  Mouth/Throat: Oropharynx is clear and moist. No oropharyngeal exudate.  Eyes: Conjunctivae are normal. Pupils are equal, round, and reactive to light. Right eye exhibits no discharge. Left eye exhibits no discharge. No scleral icterus.  Neck: Normal range of motion. Neck supple. No tracheal deviation present. No thyromegaly present.  Cardiovascular: Normal rate, regular rhythm, normal heart sounds and intact distal pulses.  Exam reveals no gallop and no friction rub.   No murmur heard. Pulmonary/Chest: Effort normal and breath sounds normal. No accessory muscle usage. No tachypnea. No respiratory distress. She has no decreased breath sounds. She has no wheezes. She has no rhonchi. She has no rales. She exhibits no tenderness.  Musculoskeletal: Normal range of motion. She exhibits no edema.       Thoracic back: She exhibits tenderness and pain. She exhibits normal range of motion, no edema and no deformity.       Back:  Lymphadenopathy:    She has no cervical adenopathy.  Neurological: She is alert and oriented to person, place, and time. She displays no atrophy and no tremor. No cranial nerve deficit or sensory deficit. She exhibits normal muscle tone. Coordination and gait normal.  Reflex Scores:      Patellar reflexes are 2+ on the right side and 2+ on the left side. Skin: Skin is warm and dry. No rash noted.  She is not diaphoretic. No erythema. No pallor.  Psychiatric: She has a normal mood and affect. Her behavior is normal. Judgment and thought content normal.          Assessment & Plan:   Problem List Items Addressed This Visit      Unprioritized   Midline low back pain - Primary     Severe midline low back pain with sudden onset. Exam is remarkable for extreme tenderness with very light touch. No overlying skin changes to suggest Herpes zoster or other infectious cause. No history of trauma to suggest vertebral fracture. However, will check plain xray to evaluate further. No neurologic findings such as numbness or weakness to suggest cord compression. Start Tramadol prn pain. If symptoms worsen over the weekend, or are not controlled with Tramadol, then she will need to be evaluated in the ED.    Relevant Medications      traMADol (ULTRAM) tablet 50 mg   Other Relevant Orders      DG Lumbar Spine Complete   Viral URI with cough    Symptoms of nasal congestion and cough most consistent with viral URI. Exam is normal. Will continue rest, supportive care. Follow up if symptoms worsen.        Return in about 1 week (around 10/02/2014) for Recheck.

## 2014-09-25 NOTE — Patient Instructions (Addendum)
Start Tramadol 50mg  up to every 8 hours as needed for pain.  Please go to the Radiology Department at [facility] for an xray of your lower back to help determine the cause of your pain.  We will call you with the results.  If your pain worsens or does not improve over the weekend, you will need to be evaluated in the emergency room.

## 2014-09-25 NOTE — Telephone Encounter (Signed)
Called pt to notify her re xray of lumbar spine. Xray showed no acute findings. If pain worsens, or if new symptoms develop, then she will need to be seen in the ED over the weekend.

## 2014-09-25 NOTE — Assessment & Plan Note (Signed)
Symptoms of nasal congestion and cough most consistent with viral URI. Exam is normal. Will continue rest, supportive care. Follow up if symptoms worsen.

## 2014-09-29 ENCOUNTER — Encounter: Payer: Self-pay | Admitting: Internal Medicine

## 2014-10-07 ENCOUNTER — Telehealth: Payer: Self-pay | Admitting: Internal Medicine

## 2014-10-07 NOTE — Telephone Encounter (Signed)
Called pt 2 times today, no answer, no vm available

## 2014-10-07 NOTE — Telephone Encounter (Signed)
I was discussing her case with Dr. Howell Rucks. She had mentioned some right lower abdominal pain to her. Does she still have this pain?

## 2014-10-08 ENCOUNTER — Encounter: Payer: Self-pay | Admitting: Internal Medicine

## 2014-10-09 NOTE — Telephone Encounter (Signed)
Called pt, no answer, no vm.

## 2014-10-09 NOTE — Telephone Encounter (Signed)
Sent mychart message

## 2014-10-15 NOTE — Telephone Encounter (Signed)
Pt has not responded to phone calls or mychart message. Mailed letter.

## 2014-10-16 ENCOUNTER — Other Ambulatory Visit: Payer: Self-pay | Admitting: *Deleted

## 2014-10-16 MED ORDER — LOSARTAN POTASSIUM 50 MG PO TABS: 50.0000 mg | ORAL_TABLET | Freq: Every day | ORAL | Status: DC

## 2014-10-16 NOTE — Telephone Encounter (Signed)
Notified pt and scheduled appts

## 2014-10-16 NOTE — Telephone Encounter (Signed)
Pt states that she is better, no abdominal pain.  She is ready to switch from Valsartan to Losartan now, pt states you and her discussed during last visit.

## 2014-10-16 NOTE — Telephone Encounter (Signed)
OK. Fine to d/c Diovan and change to Losartan 50mg  daily. #30 with 3 refills. I would recommend a 1 week check of BMP to check Cr and K on new medication and a  Nurse BP check.

## 2014-10-23 ENCOUNTER — Ambulatory Visit: Payer: 59 | Admitting: Internal Medicine

## 2014-10-23 ENCOUNTER — Other Ambulatory Visit (INDEPENDENT_AMBULATORY_CARE_PROVIDER_SITE_OTHER): Payer: 59

## 2014-10-23 ENCOUNTER — Ambulatory Visit (INDEPENDENT_AMBULATORY_CARE_PROVIDER_SITE_OTHER): Payer: 59 | Admitting: *Deleted

## 2014-10-23 VITALS — BP 142/70 | HR 92

## 2014-10-23 DIAGNOSIS — E785 Hyperlipidemia, unspecified: Secondary | ICD-10-CM | POA: Diagnosis not present

## 2014-10-23 DIAGNOSIS — I10 Essential (primary) hypertension: Secondary | ICD-10-CM

## 2014-10-23 DIAGNOSIS — E119 Type 2 diabetes mellitus without complications: Secondary | ICD-10-CM

## 2014-10-23 LAB — COMPREHENSIVE METABOLIC PANEL
ALK PHOS: 75 U/L (ref 39–117)
ALT: 30 U/L (ref 0–35)
AST: 24 U/L (ref 0–37)
Albumin: 4.1 g/dL (ref 3.5–5.2)
BILIRUBIN TOTAL: 0.3 mg/dL (ref 0.2–1.2)
BUN: 13 mg/dL (ref 6–23)
CO2: 27 mEq/L (ref 19–32)
CREATININE: 0.89 mg/dL (ref 0.40–1.20)
Calcium: 9.9 mg/dL (ref 8.4–10.5)
Chloride: 105 mEq/L (ref 96–112)
GFR: 66.2 mL/min (ref >60.00)
GLUCOSE: 204 mg/dL — AB (ref 70–99)
Potassium: 4.3 mEq/L (ref 3.5–5.1)
Sodium: 142 mEq/L (ref 135–145)
TOTAL PROTEIN: 6.6 g/dL (ref 6.0–8.3)

## 2014-10-23 LAB — MICROALBUMIN / CREATININE URINE RATIO
Creatinine,U: 34.9 mg/dL
MICROALB/CREAT RATIO: 0.3 mg/g (ref 0.0–30.0)
Microalb, Ur: 0.1 mg/dL (ref 0.0–1.9)

## 2014-10-23 LAB — LDL CHOLESTEROL, DIRECT: Direct LDL: 164 mg/dL

## 2014-10-23 LAB — LIPID PANEL
Cholesterol: 230 mg/dL — ABNORMAL HIGH (ref 0–200)
HDL: 41.5 mg/dL (ref >39.00)
NONHDL: 188.5
TRIGLYCERIDES: 241 mg/dL — AB (ref 0.0–149.0)
Total CHOL/HDL Ratio: 6
VLDL: 48.2 mg/dL — ABNORMAL HIGH (ref 0.0–40.0)

## 2014-10-23 LAB — HEMOGLOBIN A1C: Hgb A1c MFr Bld: 7.8 % — ABNORMAL HIGH (ref 4.6–6.5)

## 2014-10-23 NOTE — Progress Notes (Signed)
Labs were drawn today

## 2014-10-23 NOTE — Progress Notes (Signed)
Pt presents for lab and BP check. Has changed to Losartan x 1 week, verified taking as directed. Tolerating without any problems. BP in office 142/70. Readings at home 127/62. Advised we would contact her if any changes needed,  verbalized understanding

## 2014-10-23 NOTE — Progress Notes (Signed)
OK. Needs a repeat BMP to check electrolytes after this change in meds.

## 2014-10-28 ENCOUNTER — Other Ambulatory Visit: Payer: 59

## 2014-10-30 ENCOUNTER — Encounter: Payer: Self-pay | Admitting: Endocrinology

## 2014-11-04 ENCOUNTER — Ambulatory Visit: Payer: 59 | Admitting: Internal Medicine

## 2014-11-12 ENCOUNTER — Other Ambulatory Visit: Payer: Self-pay | Admitting: Internal Medicine

## 2015-01-29 LAB — LIPID PANEL
Cholesterol: 264
HDL Cholesterol: 45
LDL (calc): 180
TRIGLYCERIDES: 218

## 2015-01-29 LAB — HEMOGLOBIN A1C: A1C: 6.7

## 2015-01-29 LAB — MICROALBUMIN / CREATININE URINE RATIO: MICROALB/CREAT RATIO: 15

## 2015-01-29 LAB — THYROID PANEL
Free T4: 0.98
TSH: 0.62

## 2015-05-25 ENCOUNTER — Telehealth: Payer: Self-pay | Admitting: Internal Medicine

## 2015-05-25 NOTE — Telephone Encounter (Addendum)
Ok to do. May schedule medicare wellness visit in a 30 min slot, come in prior fasting for labs.

## 2015-05-25 NOTE — Telephone Encounter (Signed)
Patient called and wants to switch from Dr.Walker to [place]. Patient said she doesn't have the same connection with Dr.Walker that she had with her prior doctor.  Patient said her son, Alicha Raspberry, and his family see Dr.Gutierrez and she would like to switch to Dr.Gutierrez.  Can patient switch to Dr.Gutierrez?

## 2015-05-25 NOTE — Telephone Encounter (Signed)
Fine with me. I have not seen her since 08/2014.

## 2015-05-26 ENCOUNTER — Emergency Department: Payer: Medicare PPO

## 2015-05-26 ENCOUNTER — Observation Stay
Admission: EM | Admit: 2015-05-26 | Discharge: 2015-05-27 | Disposition: A | Discharge status: Home / Self Care | Payer: Medicare PPO | Attending: Internal Medicine | Admitting: Internal Medicine

## 2015-05-26 DIAGNOSIS — Z881 Allergy status to other antibiotic agents status: Secondary | ICD-10-CM | POA: Diagnosis not present

## 2015-05-26 DIAGNOSIS — M329 Systemic lupus erythematosus, unspecified: Secondary | ICD-10-CM | POA: Diagnosis not present

## 2015-05-26 DIAGNOSIS — M79602 Pain in left arm: Secondary | ICD-10-CM | POA: Insufficient documentation

## 2015-05-26 DIAGNOSIS — F4323 Adjustment disorder with mixed anxiety and depressed mood: Secondary | ICD-10-CM | POA: Diagnosis not present

## 2015-05-26 DIAGNOSIS — Z90710 Acquired absence of both cervix and uterus: Secondary | ICD-10-CM | POA: Insufficient documentation

## 2015-05-26 DIAGNOSIS — R110 Nausea: Secondary | ICD-10-CM | POA: Diagnosis not present

## 2015-05-26 DIAGNOSIS — Z9641 Presence of insulin pump (external) (internal): Secondary | ICD-10-CM | POA: Diagnosis not present

## 2015-05-26 DIAGNOSIS — Z820 Family history of epilepsy and other diseases of the nervous system: Secondary | ICD-10-CM | POA: Insufficient documentation

## 2015-05-26 DIAGNOSIS — M25512 Pain in left shoulder: Secondary | ICD-10-CM | POA: Diagnosis not present

## 2015-05-26 DIAGNOSIS — Z833 Family history of diabetes mellitus: Secondary | ICD-10-CM | POA: Diagnosis not present

## 2015-05-26 DIAGNOSIS — Z882 Allergy status to sulfonamides status: Secondary | ICD-10-CM | POA: Diagnosis not present

## 2015-05-26 DIAGNOSIS — Z811 Family history of alcohol abuse and dependence: Secondary | ICD-10-CM | POA: Diagnosis not present

## 2015-05-26 DIAGNOSIS — E119 Type 2 diabetes mellitus without complications: Secondary | ICD-10-CM | POA: Diagnosis not present

## 2015-05-26 DIAGNOSIS — Z825 Family history of asthma and other chronic lower respiratory diseases: Secondary | ICD-10-CM | POA: Diagnosis not present

## 2015-05-26 DIAGNOSIS — Z86718 Personal history of other venous thrombosis and embolism: Secondary | ICD-10-CM | POA: Insufficient documentation

## 2015-05-26 DIAGNOSIS — G4730 Sleep apnea, unspecified: Secondary | ICD-10-CM | POA: Diagnosis not present

## 2015-05-26 DIAGNOSIS — Z823 Family history of stroke: Secondary | ICD-10-CM | POA: Insufficient documentation

## 2015-05-26 DIAGNOSIS — R42 Dizziness and giddiness: Secondary | ICD-10-CM | POA: Insufficient documentation

## 2015-05-26 DIAGNOSIS — Z8249 Family history of ischemic heart disease and other diseases of the circulatory system: Secondary | ICD-10-CM | POA: Diagnosis not present

## 2015-05-26 DIAGNOSIS — Z96641 Presence of right artificial hip joint: Secondary | ICD-10-CM | POA: Insufficient documentation

## 2015-05-26 DIAGNOSIS — I10 Essential (primary) hypertension: Secondary | ICD-10-CM | POA: Insufficient documentation

## 2015-05-26 DIAGNOSIS — G8929 Other chronic pain: Secondary | ICD-10-CM | POA: Diagnosis present

## 2015-05-26 DIAGNOSIS — Z888 Allergy status to other drugs, medicaments and biological substances status: Secondary | ICD-10-CM | POA: Diagnosis not present

## 2015-05-26 DIAGNOSIS — M545 Low back pain: Secondary | ICD-10-CM | POA: Diagnosis not present

## 2015-05-26 DIAGNOSIS — J069 Acute upper respiratory infection, unspecified: Secondary | ICD-10-CM | POA: Insufficient documentation

## 2015-05-26 DIAGNOSIS — R21 Rash and other nonspecific skin eruption: Secondary | ICD-10-CM | POA: Diagnosis not present

## 2015-05-26 DIAGNOSIS — Z794 Long term (current) use of insulin: Secondary | ICD-10-CM | POA: Insufficient documentation

## 2015-05-26 DIAGNOSIS — Z885 Allergy status to narcotic agent status: Secondary | ICD-10-CM | POA: Insufficient documentation

## 2015-05-26 DIAGNOSIS — E039 Hypothyroidism, unspecified: Secondary | ICD-10-CM | POA: Insufficient documentation

## 2015-05-26 DIAGNOSIS — Z880 Allergy status to penicillin: Secondary | ICD-10-CM | POA: Insufficient documentation

## 2015-05-26 DIAGNOSIS — R05 Cough: Secondary | ICD-10-CM | POA: Diagnosis not present

## 2015-05-26 DIAGNOSIS — H4020X0 Unspecified primary angle-closure glaucoma, stage unspecified: Secondary | ICD-10-CM | POA: Diagnosis not present

## 2015-05-26 DIAGNOSIS — R079 Chest pain, unspecified: Principal | ICD-10-CM | POA: Insufficient documentation

## 2015-05-26 DIAGNOSIS — E785 Hyperlipidemia, unspecified: Secondary | ICD-10-CM | POA: Insufficient documentation

## 2015-05-26 LAB — GLUCOSE, CAPILLARY
Glucose-Capillary: 297 mg/dL — ABNORMAL HIGH (ref 65–99)
Glucose-Capillary: 352 mg/dL — ABNORMAL HIGH (ref 65–99)
Glucose-Capillary: 433 mg/dL — ABNORMAL HIGH (ref 65–99)
Glucose-Capillary: 331 mg/dL — ABNORMAL HIGH (ref 65–99)
Glucose-Capillary: 323 mg/dL — ABNORMAL HIGH (ref 65–99)

## 2015-05-26 LAB — HEMOGLOBIN A1C: Hgb A1c MFr Bld: 7.2 % — ABNORMAL HIGH (ref 4.0–6.0)

## 2015-05-26 LAB — BASIC METABOLIC PANEL
Anion gap: 7 (ref 5–15)
BUN: 9 mg/dL (ref 6–20)
CALCIUM: 9.6 mg/dL (ref 8.9–10.3)
CO2: 26 mmol/L (ref 22–32)
CREATININE: 0.88 mg/dL (ref 0.44–1.00)
Chloride: 107 mmol/L (ref 101–111)
GFR calc non Af Amer: >60 mL/min (ref >60)
Glucose, Bld: 134 mg/dL — ABNORMAL HIGH (ref 65–99)
Potassium: 3.8 mmol/L (ref 3.5–5.1)
SODIUM: 140 mmol/L (ref 135–145)

## 2015-05-26 LAB — TROPONIN I
Troponin I: <0.03 ng/mL (ref <0.031)
Troponin I: <0.03 ng/mL (ref <0.031)
Troponin I: <0.03 ng/mL (ref <0.031)

## 2015-05-26 LAB — CBC
HCT: 42.3 % (ref 35.0–47.0)
Hemoglobin: 13.6 g/dL (ref 12.0–16.0)
MCH: 25.9 pg — ABNORMAL LOW (ref 26.0–34.0)
MCHC: 32.2 g/dL (ref 32.0–36.0)
MCV: 80.2 fL (ref 80.0–100.0)
PLATELETS: 229 10*3/uL (ref 150–440)
RBC: 5.27 MIL/uL — AB (ref 3.80–5.20)
RDW: 14.4 % (ref 11.5–14.5)
WBC: 10.9 10*3/uL (ref 3.6–11.0)

## 2015-05-26 LAB — SEDIMENTATION RATE: Sed Rate: 26 mm/hr (ref 0–30)

## 2015-05-26 LAB — FIBRIN DERIVATIVES D-DIMER (ARMC ONLY): Fibrin derivatives D-dimer (ARMC): 950 — ABNORMAL HIGH (ref 0–499)

## 2015-05-26 MED ORDER — LOSARTAN POTASSIUM 50 MG PO TABS
50.0000 mg | ORAL_TABLET | Freq: Every day | ORAL | Status: DC
  Administered 2015-05-26 – 2015-05-27 (×2): 50 mg via ORAL
  Filled 2015-05-26 (×2): qty 1

## 2015-05-26 MED ORDER — INSULIN ASPART 100 UNIT/ML ~~LOC~~ SOLN
0.0000 [IU] | Freq: Three times a day (TID) | SUBCUTANEOUS | Status: DC
  Filled 2015-05-26: qty 15

## 2015-05-26 MED ORDER — METFORMIN HCL 500 MG PO TABS: 500.0000 mg | ORAL_TABLET | ORAL | Status: DC

## 2015-05-26 MED ORDER — NITROGLYCERIN 2 % TD OINT
0.5000 [in_us] | TOPICAL_OINTMENT | Freq: Once | TRANSDERMAL | Status: AC
  Administered 2015-05-26: 0.5 [in_us] via TOPICAL
  Filled 2015-05-26: qty 1

## 2015-05-26 MED ORDER — INSULIN PUMP
Freq: Three times a day (TID) | SUBCUTANEOUS | Status: DC
  Administered 2015-05-26 (×2): via SUBCUTANEOUS
  Filled 2015-05-26: qty 1

## 2015-05-26 MED ORDER — DOCUSATE SODIUM 100 MG PO CAPS
100.0000 mg | ORAL_CAPSULE | Freq: Two times a day (BID) | ORAL | Status: DC
  Administered 2015-05-27: 100 mg via ORAL
  Filled 2015-05-26 (×3): qty 1

## 2015-05-26 MED ORDER — VITAMIN D (ERGOCALCIFEROL) 1.25 MG (50000 UNIT) PO CAPS
50000.0000 [IU] | ORAL_CAPSULE | ORAL | Status: DC
Start: 2015-05-26 — End: 2015-05-27
  Filled 2015-05-26: qty 1

## 2015-05-26 MED ORDER — METHYLPREDNISOLONE SODIUM SUCC 125 MG IJ SOLR
INTRAMUSCULAR | Status: AC
  Administered 2015-05-26: 125 mg
  Filled 2015-05-26: qty 2

## 2015-05-26 MED ORDER — SODIUM CHLORIDE 0.9 % IV BOLUS (SEPSIS)
1000.0000 mL | Freq: Once | INTRAVENOUS | Status: AC
  Administered 2015-05-26: 1000 mL via INTRAVENOUS

## 2015-05-26 MED ORDER — IOHEXOL 350 MG/ML SOLN
75.0000 mL | Freq: Once | INTRAVENOUS | Status: AC | PRN
  Administered 2015-05-26: 75 mL via INTRAVENOUS

## 2015-05-26 MED ORDER — ONDANSETRON HCL 4 MG/2ML IJ SOLN: 4.0000 mg | Freq: Four times a day (QID) | INTRAMUSCULAR | Status: DC | PRN

## 2015-05-26 MED ORDER — INSULIN ASPART 100 UNIT/ML ~~LOC~~ SOLN: 0.0000 [IU] | Freq: Every day | SUBCUTANEOUS | Status: DC

## 2015-05-26 MED ORDER — ONDANSETRON HCL 4 MG/2ML IJ SOLN
4.0000 mg | Freq: Once | INTRAMUSCULAR | Status: AC
  Administered 2015-05-26: 4 mg via INTRAVENOUS
  Filled 2015-05-26: qty 2

## 2015-05-26 MED ORDER — ASPIRIN 81 MG PO CHEW
162.0000 mg | CHEWABLE_TABLET | Freq: Once | ORAL | Status: AC
  Administered 2015-05-26: 162 mg via ORAL
  Filled 2015-05-26: qty 2

## 2015-05-26 MED ORDER — ONDANSETRON HCL 4 MG PO TABS
4.0000 mg | ORAL_TABLET | Freq: Four times a day (QID) | ORAL | Status: DC | PRN
  Administered 2015-05-26: 4 mg via ORAL

## 2015-05-26 MED ORDER — SODIUM CHLORIDE 0.9 % IJ SOLN
3.0000 mL | Freq: Two times a day (BID) | INTRAMUSCULAR | Status: DC
  Administered 2015-05-26 – 2015-05-27 (×3): 3 mL via INTRAVENOUS

## 2015-05-26 MED ORDER — LEVOTHYROXINE SODIUM 75 MCG PO TABS
75.0000 ug | ORAL_TABLET | Freq: Every day | ORAL | Status: DC
  Administered 2015-05-26 – 2015-05-27 (×2): 75 ug via ORAL
  Filled 2015-05-26 (×2): qty 1

## 2015-05-26 MED ORDER — MORPHINE SULFATE (PF) 2 MG/ML IV SOLN
1.0000 mg | INTRAVENOUS | Status: DC | PRN
  Administered 2015-05-26 – 2015-05-27 (×2): 1 mg via INTRAVENOUS
  Filled 2015-05-26 (×2): qty 1

## 2015-05-26 MED ORDER — ACETAMINOPHEN 650 MG RE SUPP: 650.0000 mg | Freq: Four times a day (QID) | RECTAL | Status: DC | PRN

## 2015-05-26 MED ORDER — ACETAMINOPHEN 325 MG PO TABS
650.0000 mg | ORAL_TABLET | Freq: Four times a day (QID) | ORAL | Status: DC | PRN
  Administered 2015-05-26: 650 mg via ORAL
  Filled 2015-05-26: qty 2

## 2015-05-26 MED ORDER — HEPARIN SODIUM (PORCINE) 5000 UNIT/ML IJ SOLN
5000.0000 [IU] | Freq: Three times a day (TID) | INTRAMUSCULAR | Status: DC
Start: 2015-05-26 — End: 2015-05-27
  Administered 2015-05-26 – 2015-05-27 (×4): 5000 [IU] via SUBCUTANEOUS
  Filled 2015-05-26 (×4): qty 1

## 2015-05-26 MED ORDER — DIPHENHYDRAMINE HCL 50 MG/ML IJ SOLN
INTRAMUSCULAR | Status: AC
  Administered 2015-05-26: 50 mg
  Filled 2015-05-26: qty 1

## 2015-05-26 MED ORDER — SODIUM CHLORIDE 0.9 % IJ SOLN: 3.0000 mL | INTRAMUSCULAR | Status: DC | PRN

## 2015-05-26 NOTE — Progress Notes (Signed)
Inpatient Diabetes Program Recommendations  AACE/ADA: New Consensus Statement on Inpatient Glycemic Control (2013)  Target Ranges:  Prepandial:   less than 140 mg/dL      Peak postprandial:   less than 180 mg/dL (1-2 hours)      Critically ill patients:  140 - 180 mg/dL   Reason for Visit: evaluate insulin pump  Diabetes history: Type 2- has worn an insulin pump for about 9 years Outpatient Diabetes medications: Glucophage 1000mg  qhs, 500mg  qam,  Novolog insulin via pump TDD 23.5-27 units but as much as 50 units per day  Patient scrolled through her pump to show me:  Basal rates; 12am-3am= 0.8units/hour         3am-9:30am=0.875units/hour       9:30am-5pm=1.0units/hour        5pm-6:30pm=1.5units/hour   6:30pm-midnight= 1.3units/hour   12am  Goal CBG= 130mg /dl  6am    Goal CBG=100mg /dl 8am    Goal CBG=130mg /dl 11:30a  Goal CBG= 100mg /dl 2pm  Goal CBG= 130mg /dl She could not locate in her pump her insulin to carb ratio, or insulin sensitivity factor.   Current orders for Inpatient glycemic control: Novolog insulin via Medtronic insulin pump. Consent signed.   From the endocrinology visit at [facility] on 01/29/15 MD documents the following settings;  Patient currently uses a Medtronic insulin pump with Novolog insulin.  Current basal settings are:  Midnight 0.8 units/hour  3 AM 0.8 units/hour 9:30 AM 1.0 units/hour 16:30 PM 1.35 units/hour 18:00 PM 1.2 units/hour 23.30 PM 1.3 units/hour  Current bolus settings are: Midnight 1:10 insulin/carb 6 PM 1:8 insulin/carb Insulin Sensitivity Factor of 35  Glucose target of 100-130  Active insulin time of 4 hours Changes pump site every 3 days and has an off-pump plan  Patient tells me this was indeed the last visit she had with the endocrinologist at [facility].    Gentry Fitz, RN, BA, MHA, CDE Diabetes Coordinator Inpatient Diabetes Program  [phone] (Team Pager) [phone] ([place]) 05/26/2015 2:07 PM

## 2015-05-26 NOTE — ED Notes (Signed)
Pt to triage via w/c with no distress noted; pt reports left arm pain and mid upper back pain accomp by nausea approx 1230am; denies hx of same

## 2015-05-26 NOTE — H&P (Signed)
Cassandra Benson is an 74 y.o. female.   Chief Complaint: Chest pain HPI: The patient presents to the emergency department after 5 hours of vague left arm and chest pain. The patient states that she's been sitting at her computer playing games when her left antecubital fossa began to hurt. The patient prepared herself for bed and lay down only to experience a worsening of her arm pain that radiated into her left upper chest. The patient denied any shortness of breath or diaphoresis. She admits to some nausea but denies vomiting. She took 2 baby aspirin at home which did not relieve her pain. In the emergency department the patient admitted to some pain with deep inspiration which prompted CTA of the chest. Imaging showed no evidence of pulmonary embolism but the patient did develop a rash presumably to contrast allergy. She continued to have chest pain such that the emergency department staff placed half an inch of nitroglycerin paste on her chest. Now she is chest pain-free. Due to her comorbidities and atypical chest pain the emergency department staff called for admission.  Past Medical History  Diagnosis Date  . Arthritis   . History of chicken pox   . History of shingles   . Diabetes mellitus without complication   . History of diverticulitis   . Glaucoma     Narrow angle  . Allergy   . Hyperlipidemia   . Thyroid disease   . History of UTI   . History of blood clots     DVT, in 20s, none since  . Lupus (systemic lupus erythematosus)     Past Surgical History  Procedure Laterality Date  . Tonsillectomy and adenoidectomy    . Partial hip arthroplasty  2013    Right hip replacement  . Abdominal hysterectomy      menorrhagia  . Vaginal delivery      x2, no complications    Family History  Problem Relation Age of Onset  . Heart disease Mother     aortic valve issues  . COPD Mother   . Heart disease Father     CABG  . Diabetes Sister   . Stroke Sister   . Alcohol abuse  Brother   . Heart disease Brother   . Stroke Brother   . Seizures Son   . COPD Brother   . Heart disease Brother   . Diabetes Brother   . Depression Grandchild   . Cancer Maternal Aunt     breast   Social History:  reports that she has never smoked. She does not have any smokeless tobacco history on file. She reports that she does not drink alcohol or use illicit drugs.  Allergies:  Allergies  Allergen Reactions  . Iodinated Diagnostic Agents Other (See Comments)    Itching and chest pain  . Codeine Nausea Only  . Influenza Vaccines     Muscle weakness  . Valsartan     Allergy to generic only Other reaction(s): Cough "Hacking" cough  . Erythromycin Rash and Swelling  . Penicillins Rash and Swelling  . Sulfa Antibiotics Rash    Prior to Admission medications   Medication Sig Start Date End Date Taking? Authorizing Provider  Cholecalciferol 50000 UNITS TABS Take 25,000 mg by mouth every 14 (fourteen) days. Take one tablet by mouth every 14 days (2 weeks)   Yes Historical Provider, MD  Cyanocobalamin (B-12 SL) Place 1 tablet under the tongue daily.    Yes Historical Provider, MD  insulin aspart (NOVOLOG)  100 UNIT/ML injection Use in an insulin pump upto 50 units per day Patient taking differently: Inject 23.5-27 Units into the skin. Use in an insulin pump upto 50 units per day 07/30/14  Yes Radhika P Phadke, MD  LEVOXYL 75 MCG tablet TAKE 1 TABLET 6 DAYS A WEEK, AND 1.5 TABLETS BY MOUTH ONE DAY A WEEK. 11/12/14  Yes Jackolyn Confer, MD  losartan (COZAAR) 50 MG tablet Take 1 tablet (50 mg total) by mouth daily. 10/16/14  Yes Jackolyn Confer, MD  metFORMIN (GLUCOPHAGE) 500 MG tablet Take 500 mg by mouth every morning.    Yes Historical Provider, MD  metFORMIN (GLUCOPHAGE) 500 MG tablet Take 1,000 mg by mouth every evening.   Yes Historical Provider, MD  BD ULTRA-FINE LANCETS lancets Use as instructed upto 6 times daily 09/02/14   Radhika P Phadke, MD  traMADol (ULTRAM) 50 MG  tablet Take 1 tablet (50 mg total) by mouth every 8 (eight) hours as needed. 09/25/14   Jackolyn Confer, MD     Results for orders placed or performed during the hospital encounter of 05/26/15 (from the past 48 hour(s))  Basic metabolic panel     Status: Abnormal   Collection Time: 05/26/15  3:04 AM  Result Value Ref Range   Sodium 140 135 - 145 mmol/L   Potassium 3.8 3.5 - 5.1 mmol/L   Chloride 107 101 - 111 mmol/L   CO2 26 22 - 32 mmol/L   Glucose, Bld 134 (H) 65 - 99 mg/dL   BUN 9 6 - 20 mg/dL   Creatinine, Ser 0.88 0.44 - 1.00 mg/dL   Calcium 9.6 8.9 - 10.3 mg/dL   GFR calc non Af Amer >60 >60 mL/min   GFR calc Af Amer >60 >60 mL/min    Comment: (NOTE) The eGFR has been calculated using the CKD EPI equation. This calculation has not been validated in all clinical situations. eGFR's persistently <60 mL/min signify possible Chronic Kidney Disease.    Anion gap 7 5 - 15  CBC     Status: Abnormal   Collection Time: 05/26/15  3:04 AM  Result Value Ref Range   WBC 10.9 3.6 - 11.0 K/uL   RBC 5.27 (H) 3.80 - 5.20 MIL/uL   Hemoglobin 13.6 12.0 - 16.0 g/dL   HCT 42.3 35.0 - 47.0 %   MCV 80.2 80.0 - 100.0 fL   MCH 25.9 (L) 26.0 - 34.0 pg   MCHC 32.2 32.0 - 36.0 g/dL   RDW 14.4 11.5 - 14.5 %   Platelets 229 150 - 440 K/uL  Troponin I     Status: None   Collection Time: 05/26/15  3:04 AM  Result Value Ref Range   Troponin I <0.03 <0.031 ng/mL    Comment:        NO INDICATION OF MYOCARDIAL INJURY.   Fibrin derivatives D-Dimer ([facility] only)     Status: Abnormal   Collection Time: 05/26/15  3:04 AM  Result Value Ref Range   Fibrin derivatives D-dimer ([facility]) 950 (H) 0 - 499    Comment: <> Exclusion of Venous Thromboembolism (VTE) - OUTPATIENTS ONLY        (Emergency Department or [place])             0-499 ng/ml (FEU)  : With a low to intermediate pretest  probability for VTE this test result                                        excludes  the diagnosis of VTE.           > 499 ng/ml (FEU)  : VTE not excluded.  Additional work up                                   for VTE is required.   <>  Testing on Inpatients and Evaluation of Disseminated Intravascular        Coagulation (DIC)             Reference Range:   0-499 ng/ml (FEU)    Dg Chest 2 View  05/26/2015   CLINICAL DATA:  Cough. Chest and left shoulder pain. Duration 2 days.  EXAM: CHEST  2 VIEW  COMPARISON:  None.  FINDINGS: Minimal patchy left base opacity adjacent to the cardiac apex could represent early infiltrate or atelectasis. The lungs are otherwise clear. There is no pleural effusion. Heart size is normal. Pulmonary vasculature is normal. Hilar and mediastinal contours are unremarkable.  IMPRESSION: Minimal patchy left lung opacity adjacent to the cardiac apex, possibly early infiltrate or atelectasis.   Electronically Signed   By: Andreas Newport M.D.   On: 05/26/2015 03:26   Ct Angio Chest Pe W/cm &/or Wo Cm  05/26/2015   CLINICAL DATA:  Mid upper back pain and left arm pain.  Nausea.  EXAM: CT ANGIOGRAPHY CHEST WITH CONTRAST  TECHNIQUE: Multidetector CT imaging of the chest was performed using the standard protocol during bolus administration of intravenous contrast. Multiplanar CT image reconstructions and MIPs were obtained to evaluate the vascular anatomy.  CONTRAST:  61m OMNIPAQUE IOHEXOL 350 MG/ML SOLN  COMPARISON:  Radiographs 05/26/2015  FINDINGS: Cardiovascular: There is good opacification of the pulmonary arteries. There is no pulmonary embolism. The thoracic aorta is normal in caliber and intact.  Lungs: The lungs are clear. The patchy opacity adjacent to the cardiac apex on radiography is due to a prominent epicardial fat pad.  Central airways: Patent  Effusions: No pleural effusions. There is minimal pericardial fluid or thickening.  Lymphadenopathy: None  Esophagus: Unremarkable  Upper abdomen: No significant abnormality  Musculoskeletal: No significant  abnormality  Review of the MIP images confirms the above findings.  IMPRESSION: 1. Negative for acute pulmonary embolism 2. Minimal pericardial fluid or thickening. 3. The lungs are clear. Patchy radiographic opacity is attributable to prominent epicardial fat pad.   Electronically Signed   By: DAndreas NewportM.D.   On: 05/26/2015 06:29    Review of Systems  Constitutional: Positive for malaise/fatigue. Negative for fever, chills and diaphoresis.  HENT: Negative for sore throat and tinnitus.   Eyes: Negative for blurred vision and redness.  Respiratory: Negative for cough and shortness of breath.   Cardiovascular: Positive for chest pain. Negative for palpitations, orthopnea and PND.  Gastrointestinal: Positive for nausea. Negative for vomiting, abdominal pain and diarrhea.  Genitourinary: Negative for dysuria, urgency and frequency.  Musculoskeletal: Negative for myalgias and joint pain.       Arm pain  Skin: Negative for rash.       No lesions  Neurological: Negative for speech change, focal weakness and weakness.  Endo/Heme/Allergies: Does not bruise/bleed easily.  No temperature intolerance  Psychiatric/Behavioral: Negative for depression and suicidal ideas.    Blood pressure 147/71, pulse 68, temperature 97.9 F (36.6 C), temperature source Oral, resp. rate 10, SpO2 100 %. Physical Exam  Vitals reviewed. Constitutional: She is oriented to person, place, and time. She appears well-developed and well-nourished. No distress.  HENT:  Head: Normocephalic and atraumatic.  Mouth/Throat: Oropharynx is clear and moist.  Eyes: Conjunctivae and EOM are normal. Pupils are equal, round, and reactive to light. No scleral icterus.  Neck: Normal range of motion. Neck supple. No JVD present. No tracheal deviation present. No thyromegaly present.  Cardiovascular: Normal rate, regular rhythm and normal heart sounds.  Exam reveals no gallop and no friction rub.   No murmur  heard. Respiratory: Effort normal and breath sounds normal.  GI: Soft. Bowel sounds are normal. She exhibits no distension. There is no tenderness.  Genitourinary:  Deferred  Lymphadenopathy:    She has no cervical adenopathy.  Neurological: She is alert and oriented to person, place, and time. No cranial nerve deficit. She exhibits normal muscle tone.  Skin: Skin is warm and dry. She is not diaphoretic.  Psychiatric: She has a normal mood and affect. Judgment and thought content normal.     Assessment/Plan This is a 74 year old Caucasian female admitted for atypical chest pain. 1. Chest pain: Highly unlikely to be ischemic given duration as well as negative biomarkers and no evidence of ischemia on EKG. Cycle cardiac enzymes. Cardiology consultation at the discretion of the primary team. 2. Diabetes mellitus type 2: Hold metformin; start patient on sliding scale insulin while hospitalized 3. Hypertension: Continue valsartan 4. Hypothyroidism: Continue Synthroid 5. SLE: No evidence of cervicitis at this time although the patient's vague muscular skeletal symptoms could be a manifestation of lupus 6. DVT prophylaxis: Heparin 7. GI prophylaxis: None The patient is a full code. Time spent on admission orders and patient care approximately 35 minutes  Harrie Foreman 05/26/2015, 7:04 AM

## 2015-05-26 NOTE — ED Provider Notes (Signed)
[facility] Emergency Department Provider Note  ____________________________________________  Time seen: Approximately 4:23 AM  I have reviewed the triage vital signs and the nursing notes.   HISTORY  Chief Complaint Chest Pain    HPI Cassandra Benson is a 74 y.o. female who presents to the ED from home with a chief complaint of left arm pain, chest and upper back pain. Patient has a past medical history significant for IDDM on insulin pump, history of DVT and hyperlipidemia. Symptoms onset this morning approximately 12:30 AM with nontraumatic heaviness to left elbow radiating into patient's shoulder, left chest and mid back. Symptoms associated with diaphoresis, nausea and dizziness. Patient states she feels chronically short of breath but does not feel more short of breath tonight. She does however, noteincreased fatigue this week. Patient and spouse drove home from [place] approximately 2 weeks ago. Denies fever, chills, vomiting, diarrhea, dysuria, leg pain or swelling.   Past Medical History  Diagnosis Date  . Arthritis   . History of chicken pox   . History of shingles   . Diabetes mellitus without complication   . History of diverticulitis   . Glaucoma     Narrow angle  . Allergy   . Hyperlipidemia   . Thyroid disease   . History of UTI   . History of blood clots     DVT, in 20s, none since  . Lupus (systemic lupus erythematosus)     Patient Active Problem List   Diagnosis Date Noted  . Lupus (systemic lupus erythematosus)   . Midline low back pain 09/25/2014  . Viral URI with cough 09/25/2014  . Refusal of statin medication by patient 09/23/2014  . 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 Hyperlipidemia 07/30/2014  . Diabetes type 2, controlled 07/17/2014  . Essential hypertension 07/17/2014  . Hypothyroidism 07/17/2014  . Adjustment disorder with mixed anxiety and depressed mood 07/17/2014  . Screening for breast cancer  07/17/2014  . Apnea, sleep 08/22/2012  . LBP (low back pain) 02/27/2012  . Disseminated lupus erythematosus 01/06/2012    Past Surgical History  Procedure Laterality Date  . Tonsillectomy and adenoidectomy    . Partial hip arthroplasty  2013    Right hip replacement  . Abdominal hysterectomy      menorrhagia  . Vaginal delivery      x2, no complications    Current Outpatient Rx  Name  Route  Sig  Dispense  Refill  . Cholecalciferol 50000 UNITS TABS   Oral   Take 25,000 mg by mouth every 14 (fourteen) days. Take one tablet by mouth every 14 days (2 weeks)         . Cyanocobalamin (B-12 SL)   Sublingual   Place 1 tablet under the tongue daily.          . insulin aspart (NOVOLOG) 100 UNIT/ML injection      Use in an insulin pump upto 50 units per day Patient taking differently: Inject 23.5-27 Units into the skin. Use in an insulin pump upto 50 units per day   2 vial   6   . LEVOXYL 75 MCG tablet      TAKE 1 TABLET 6 DAYS A WEEK, AND 1.5 TABLETS BY MOUTH ONE DAY A WEEK.   32 tablet   3   . losartan (COZAAR) 50 MG tablet   Oral   Take 1 tablet (50 mg total) by mouth daily.   30 tablet   3   .  metFORMIN (GLUCOPHAGE) 500 MG tablet   Oral   Take 500 mg by mouth every morning.          . metFORMIN (GLUCOPHAGE) 500 MG tablet   Oral   Take 1,000 mg by mouth every evening.         . BD ULTRA-FINE LANCETS lancets      Use as instructed upto 6 times daily   600 each   2   . traMADol (ULTRAM) 50 MG tablet   Oral   Take 1 tablet (50 mg total) by mouth every 8 (eight) hours as needed.   30 tablet   0     Allergies Iodinated diagnostic agents; Codeine; Influenza vaccines; Valsartan; Erythromycin; Penicillins; and Sulfa antibiotics  Family History  Problem Relation Age of Onset  . Heart disease Mother     aortic valve issues  . COPD Mother   . Heart disease Father     CABG  . Diabetes Sister   . Stroke Sister   . Alcohol abuse Brother   . Heart  disease Brother   . Stroke Brother   . Seizures Son   . COPD Brother   . Heart disease Brother   . Diabetes Brother   . Depression Grandchild   . Cancer Maternal Aunt     breast    Social History Social History  Substance Use Topics  . Smoking status: Never Smoker   . Smokeless tobacco: None  . Alcohol Use: No    Review of Systems Constitutional: No fever/chills Eyes: No visual changes. ENT: No sore throat. Cardiovascular: Positive for chest pain. Respiratory: Denies shortness of breath. Gastrointestinal: No abdominal pain.  No nausea, no vomiting.  No diarrhea.  No constipation. Genitourinary: Negative for dysuria. Musculoskeletal: Negative for back pain. Skin: Negative for rash. Neurological: Negative for headaches, focal weakness or numbness.  10-point ROS otherwise negative.  ____________________________________________   PHYSICAL EXAM:  VITAL SIGNS: ED Triage Vitals  Enc Vitals Group     BP 05/26/15 0337 139/65 mmHg     Pulse Rate 05/26/15 0337 68     Resp 05/26/15 0337 18     Temp 05/26/15 0337 97.9 F (36.6 C)     Temp Source 05/26/15 0337 Oral     SpO2 05/26/15 0337 95 %     Weight --      Height --      Head Cir --      Peak Flow --      Pain Score 05/26/15 0248 8     Pain Loc --      Pain Edu? --      Excl. in [place]? --     Constitutional: Alert and oriented. Well appearing and in mild acute distress. Eyes: Conjunctivae are normal. PERRL. EOMI. Head: Atraumatic. Nose: No congestion/rhinnorhea. Mouth/Throat: Mucous membranes are moist.  Oropharynx non-erythematous. Neck: No stridor.   Cardiovascular: Normal rate, regular rhythm. Grossly normal heart sounds.  Good peripheral circulation. Respiratory: Normal respiratory effort.  No retractions. Lungs CTAB. Gastrointestinal: Soft and nontender. No distention. No abdominal bruits. No CVA tenderness. Musculoskeletal: No lower extremity tenderness nor edema.  No joint effusions. No tenderness to  range of motion left upper extremity. Neurologic:  Normal speech and language. No gross focal neurologic deficits are appreciated. No gait instability. Skin:  Skin is warm, dry and intact. No rash noted. Psychiatric: Mood and affect are normal. Speech and behavior are normal.  ____________________________________________   LABS (all labs ordered are listed, but  only abnormal results are displayed)  Labs Reviewed  BASIC METABOLIC PANEL - Abnormal; Notable for the following:    Glucose, Bld 134 (*)    All other components within normal limits  CBC - Abnormal; Notable for the following:    RBC 5.27 (*)    MCH 25.9 (*)    All other components within normal limits  FIBRIN DERIVATIVES D-DIMER ([facility] ONLY) - Abnormal; Notable for the following:    Fibrin derivatives D-dimer ([facility]) 950 (*)    All other components within normal limits  TROPONIN I   ____________________________________________  EKG  ED ECG REPORT I, SUNG,JADE J, the attending physician, personally viewed and interpreted this ECG.   Date: 05/26/2015  EKG Time: 0247  Rate: 73  Rhythm: normal EKG, normal sinus rhythm  Axis: Normal  Intervals:none  ST&T Change: Nonspecific  ____________________________________________  RADIOLOGY  Chest 2 view (viewed by me, interpreted per Dr. Alroy Dust): Minimal patchy left lung opacity adjacent to the cardiac apex, possibly early infiltrate or atelectasis.  ____________________________________________   PROCEDURES  Procedure(s) performed: None  Critical Care performed:CRITICAL CARE Performed by: Paulette Blanch   Total critical care time: 30 minutes  Critical care time was exclusive of separately billable procedures and treating other patients.  Critical care was necessary to treat or prevent imminent or life-threatening deterioration.  Critical care was time spent personally by me on the following activities: development of treatment plan with patient and/or surrogate as  well as nursing, discussions with consultants, evaluation of patient's response to treatment, examination of patient, obtaining history from patient or surrogate, ordering and performing treatments and interventions, ordering and review of laboratory studies, ordering and review of radiographic studies, pulse oximetry and re-evaluation of patient's condition.  ____________________________________________   INITIAL IMPRESSION / ASSESSMENT AND PLAN / ED COURSE  Pertinent labs & imaging results that were available during my care of the patient were reviewed by me and considered in my medical decision making (see chart for details).  74 year old female who presents for left arms heaviness associated with chest and mid back pain. Labs notable for elevated d-dimer; will proceed with CT angiography chest to evaluate for pulmonary embolism. Patient took 2 baby aspirin prior to arrival; will order additional 2 baby aspirin. IV Zofran ordered for nausea. Will reassess.  ----------------------------------------- 6:12 AM on 05/26/2015 -----------------------------------------  Patient came back from CT scan itching, actively scratching ears. Not hypoxic, no angioedema or tongue swelling. Will administer IV benadryl, solumedrol, pepcid and observe. Patient complaining of active chest pain. Will repeat EKG.  ----------------------------------------- 6:53 AM on 05/26/2015 -----------------------------------------  EKG unchanged. Patient is feeling better on supplemental oxygen; IV meds administered for allergic reaction. Itching has decreased. There are no hives, angioedema or tongue swelling. Discussed case with hospitalist to evaluate in the emergency department for admission. ____________________________________________   FINAL CLINICAL IMPRESSION(S) / ED DIAGNOSES  Final diagnoses:  Chest pain, unspecified chest pain type      Paulette Blanch, MD 05/26/15 [phone]

## 2015-05-27 LAB — LIPID PANEL
CHOL/HDL RATIO: 5.2 ratio
CHOLESTEROL: 273 mg/dL — AB (ref 0–200)
HDL: 53 mg/dL (ref >40)
LDL Cholesterol: 194 mg/dL — ABNORMAL HIGH (ref 0–99)
TRIGLYCERIDES: 132 mg/dL (ref <150)
VLDL: 26 mg/dL (ref 0–40)

## 2015-05-27 LAB — GLUCOSE, CAPILLARY: Glucose-Capillary: 161 mg/dL — ABNORMAL HIGH (ref 65–99)

## 2015-05-27 NOTE — Care Management (Signed)
Patient has insulin pump and is followed by [facility].  Troponins are negative.

## 2015-05-27 NOTE — Progress Notes (Signed)
Patient is discharge in a stable condition, denies pain at time of discharge, summary and f/u care given , left with her husband

## 2015-05-27 NOTE — Discharge Instructions (Signed)
Continue to use Motrin for pain   DIET:  Cardiac diet and Diabetic diet  DISCHARGE CONDITION:  Fair  ACTIVITY:  Activity as tolerated  OXYGEN:  Home Oxygen: No.   Oxygen Delivery: room air  DISCHARGE LOCATION:  home   If you experience worsening of your admission symptoms, develop shortness of breath, life threatening emergency, suicidal or homicidal thoughts you must seek medical attention immediately by calling 911 or calling your MD immediately  if symptoms less severe.  You Must read complete instructions/literature along with all the possible adverse reactions/side effects for all the Medicines you take and that have been prescribed to you. Take any new Medicines after you have completely understood and accpet all the possible adverse reactions/side effects.   Please note  You were cared for by a hospitalist during your hospital stay. If you have any questions about your discharge medications or the care you received while you were in the hospital after you are discharged, you can call the unit and asked to speak with the hospitalist on call if the hospitalist that took care of you is not available. Once you are discharged, your primary care physician will handle any further medical issues. Please note that NO REFILLS for any discharge medications will be authorized once you are discharged, as it is imperative that you return to your primary care physician (or establish a relationship with a primary care physician if you do not have one) for your aftercare needs so that they can reassess your need for medications and monitor your lab values.

## 2015-05-28 ENCOUNTER — Telehealth: Payer: Self-pay | Admitting: *Deleted

## 2015-05-28 NOTE — Discharge Summary (Signed)
[place] [facility] [place] NAME: Cassandra Benson    MR#:  [id number]  DATE OF BIRTH:  12-Sep-1941  DATE OF ADMISSION:  05/26/2015 ADMITTING PHYSICIAN: Harrie Foreman, MD  DATE OF DISCHARGE: 05/27/2015  PRIMARY CARE PHYSICIAN: [name], MD    ADMISSION DIAGNOSIS:  Chest pain, unspecified chest pain type [R07.9]  DISCHARGE DIAGNOSIS:  Active Problems:   Chest pain   SECONDARY DIAGNOSIS:   Past Medical History  Diagnosis Date  . Arthritis   . History of chicken pox   . History of shingles   . Diabetes mellitus without complication   . History of diverticulitis   . Glaucoma     Narrow angle  . Allergy   . Hyperlipidemia   . Thyroid disease   . History of UTI   . History of blood clots     DVT, in 20s, none since  . Lupus (systemic lupus erythematosus)     HOSPITAL COURSE:   #1 chest pain: No EKG changes or events on telemetry. Troponins negative 3. On examination this morning the pain seems to be more in the left shoulder joint. She states that she has a history of rheumatoid arthritis, there is no effusion or restriction. She is ruled out for ACS. She does have a significant family history of coronary artery disease and stress testing should be considered in the outpatient setting. She will follow-up with her primary care physician in the next 2 weeks.  #2 hyperlipidemia: Her LDL is 194, in diabetics goal would be less than 100. Given her family history it is important that she consider treating her hyperlipidemia. This was discussed with the patient who declined any medical treatment for hyperlipidemia at this time.   #3 diabetes mellitus type 2: She has an insulin pump and managed her blood sugars throughout the hospitalization. No changes made to her home regimen.  #4 elevated d-dimer: Due to presentation with chest pain and elevated d-dimer CT angiogram was performed and this was negative for  PE.  DISCHARGE CONDITIONS:   Stable  CONSULTS OBTAINED:     None  DRUG ALLERGIES:   Allergies  Allergen Reactions  . Iodinated Diagnostic Agents Other (See Comments)    Itching and chest pain  . Codeine Nausea Only  . Influenza Vaccines     Muscle weakness  . Valsartan     Allergy to generic only Other reaction(s): Cough "Hacking" cough  . Erythromycin Rash and Swelling  . Penicillins Rash and Swelling  . Sulfa Antibiotics Rash    DISCHARGE MEDICATIONS:   Discharge Medication List as of 05/27/2015 10:57 AM    CONTINUE these medications which have NOT CHANGED   Details  Cholecalciferol 50000 UNITS TABS Take 25,000 mg by mouth every 14 (fourteen) days. Take one tablet by mouth every 14 days (2 weeks), Until Discontinued, Historical Med    Cyanocobalamin (B-12 SL) Place 1 tablet under the tongue daily. , Until Discontinued, Historical Med    insulin aspart (NOVOLOG) 100 UNIT/ML injection Use in an insulin pump upto 50 units per day, Normal    LEVOXYL 75 MCG tablet TAKE 1 TABLET 6 DAYS A WEEK, AND 1.5 TABLETS BY MOUTH ONE DAY A WEEK., Normal    losartan (COZAAR) 50 MG tablet Take 1 tablet (50 mg total) by mouth daily., Starting 10/16/2014, Until Discontinued, Normal    !! metFORMIN (GLUCOPHAGE) 500 MG tablet Take 500 mg by mouth every morning. , Until Discontinued,  Historical Med    !! metFORMIN (GLUCOPHAGE) 500 MG tablet Take 1,000 mg by mouth every evening., Until Discontinued, Historical Med    BD ULTRA-FINE LANCETS lancets Use as instructed upto 6 times daily, Normal     !! - Potential duplicate medications found. Please discuss with provider.    STOP taking these medications     traMADol (ULTRAM) 50 MG tablet          DISCHARGE INSTRUCTIONS:   See AVS  Today   CHIEF COMPLAINT:   Chief Complaint  Patient presents with  . Chest Pain    HISTORY OF PRESENT ILLNESS:  HPI: The patient presents to the emergency department after 5 hours of vague left  arm and chest pain. The patient states that she's been sitting at her computer playing games when her left antecubital fossa began to hurt. The patient prepared herself for bed and lay down only to experience a worsening of her arm pain that radiated into her left upper chest. The patient denied any shortness of breath or diaphoresis. She admits to some nausea but denies vomiting. She took 2 baby aspirin at home which did not relieve her pain. In the emergency department the patient admitted to some pain with deep inspiration which prompted CTA of the chest. Imaging showed no evidence of pulmonary embolism but the patient did develop a rash presumably to contrast allergy. She continued to have chest pain such that the emergency department staff placed half an inch of nitroglycerin paste on her chest. Now she is chest pain-free. Due to her comorbidities and atypical chest pain the emergency department staff called for admission.   VITAL SIGNS:  Blood pressure 134/67, pulse 63, temperature 97.4 F (36.3 C), temperature source Oral, resp. rate 16, weight 73.483 kg (162 lb), SpO2 98 %.  I/O:  No intake or output data in the 24 hours ending 05/28/15 1431  PHYSICAL EXAMINATION:  GENERAL:  74 y.o.-year-old patient lying in the bed with no acute distress.  LUNGS: Normal breath sounds bilaterally, no wheezing, rales, rhonchi or crepitation. No use of accessory muscles of respiration.  CARDIOVASCULAR: S1, S2 normal. No murmurs, rubs, or gallops.  ABDOMEN: Soft, non-tender, non-distended. Bowel sounds present. No organomegaly or mass.  EXTREMITIES: No pedal edema, cyanosis, or clubbing. Pain with manipulation of the left shoulder, range of motion normal no effusion NEUROLOGIC: Cranial nerves II through XII are intact. Muscle strength 5/5 in all extremities. Sensation intact. Gait not checked.  PSYCHIATRIC: The patient is alert and oriented x 3. Anxious SKIN: No obvious rash, lesion, or ulcer.   DATA REVIEW:    CBC  Recent Labs Lab 05/26/15 0304  WBC 10.9  HGB 13.6  HCT 42.3  PLT 229    Chemistries   Recent Labs Lab 05/26/15 0304  NA 140  K 3.8  CL 107  CO2 26  GLUCOSE 134*  BUN 9  CREATININE 0.88  CALCIUM 9.6    Cardiac Enzymes  Recent Labs Lab 05/26/15 2034  TROPONINI <0.03    Microbiology Results  No results found for this or any previous visit.  RADIOLOGY:  No results found.  EKG:   Orders placed or performed during the hospital encounter of 05/26/15  . ED EKG within 10 minutes  . ED EKG within 10 minutes  . EKG 12-Lead  . EKG 12-Lead  . EKG 12-Lead  . EKG 12-Lead  . EKG      Management plans discussed with the patient, family and they are in  agreement.  CODE STATUS: Full  TOTAL TIME TAKING CARE OF THIS PATIENT: 50 minutes.  Greater than 50% of time spent in care coordination and counseling.  Myrtis Ser M.D on 05/28/2015 at 2:31 PM  Between 7am to 6pm - Pager - [phone]  After 6pm go to [email or web address] - password [email or web address] [facility]  [facility]  Office  [phone]  CC: Primary care physician; [name], MD

## 2015-05-28 NOTE — Telephone Encounter (Signed)
Transition Care Management Follow-up Telephone Call  Date discharged?  05/27/15   How have you been since you were released from the hospital? Still weak and having some intermittent pain.   Do you understand why you were in the hospital? yes   Do you understand the discharge instructions? yes   Where were you discharged to? Home   Items Reviewed:  Medications reviewed: yes  Allergies reviewed: yes  Dietary changes reviewed: no  Referrals reviewed: no   Functional Questionnaire:   Activities of Daily Living (ADLs):   She states they are independent in the following: ambulation, bathing and hygiene, feeding, continence, grooming, toileting and dressing States they require assistance with the following: None, just weak at times   Any transportation issues/concerns?: no   Any patient concerns? yes, repeat labs and continued pain   Confirmed importance and date/time of follow-up visits scheduled yes, 06/02/15 at 1400  Provider Appointment booked with [name], MD  Confirmed with patient if condition begins to worsen call PCP or go to the ER.  Patient was given the office number and encouraged to call back with question or concerns.  : yes

## 2015-06-02 ENCOUNTER — Encounter: Payer: Self-pay | Admitting: Family Medicine

## 2015-06-02 ENCOUNTER — Ambulatory Visit (INDEPENDENT_AMBULATORY_CARE_PROVIDER_SITE_OTHER): Payer: Medicare PPO | Admitting: Family Medicine

## 2015-06-02 VITALS — BP 126/66 | HR 96 | Temp 98.2°F | Wt 162.2 lb

## 2015-06-02 DIAGNOSIS — M329 Systemic lupus erythematosus, unspecified: Secondary | ICD-10-CM

## 2015-06-02 DIAGNOSIS — E039 Hypothyroidism, unspecified: Secondary | ICD-10-CM

## 2015-06-02 DIAGNOSIS — R0789 Other chest pain: Secondary | ICD-10-CM | POA: Diagnosis not present

## 2015-06-02 DIAGNOSIS — E785 Hyperlipidemia, unspecified: Secondary | ICD-10-CM

## 2015-06-02 DIAGNOSIS — I10 Essential (primary) hypertension: Secondary | ICD-10-CM | POA: Diagnosis not present

## 2015-06-02 DIAGNOSIS — Z8249 Family history of ischemic heart disease and other diseases of the circulatory system: Secondary | ICD-10-CM

## 2015-06-02 DIAGNOSIS — E119 Type 2 diabetes mellitus without complications: Secondary | ICD-10-CM

## 2015-06-02 NOTE — Assessment & Plan Note (Signed)
Will review records in chart. Did not tolerate plaquenil (diarrhea). Consider referral back to rheum.

## 2015-06-02 NOTE — Progress Notes (Signed)
Pre visit review using our clinic review tool, if applicable. No additional management support is needed unless otherwise documented below in the visit note. 

## 2015-06-02 NOTE — Progress Notes (Signed)
BP 126/66 mmHg  Pulse 96  Temp(Src) 98.2 F (36.8 C) (Oral)  Wt 162 lb 4 oz (73.596 kg)   CC: [facility] ER f/u  Subjective:    Patient ID: Cassandra Benson, female    DOB: 07-17-1941, 74 y.o.   MRN: [id number]  HPI: Cassandra Benson is a 74 y.o. female presenting on 06/02/2015 for Follow-up   Transfer of care from Dr Gilford Rile. Patient with SLE, DM, HLD, hypothyroidism, h/o DVT age 27s. Grandson committed suicide 2014 (in [place]).  Recent hospitalization at [facility] for chest pain - eval was unrevealing without EKG or telemetry changes, normal troponins x3. Possible L shoulder joint pain rather than chest pain. D dimer was elevated but CT chest angiogram was negative for pulm embolism. She also developed a rash presumed to IV contrast. She continues to have chest discomfort associated with L arm pain. She also describes chest soreness. Worsening fatigue over last 2-3 weeks.  She does have significant fmhx CAD (brothers and parents) and stress testing was recommended.   Hypothyroidism - on levoxyl 31mcg daily. Dx 9 yrs ago. Never hyperthyroid.  DM - followed by [facility] endocrinology (Dr Darene Lamer and her NP) with insulin pump and metformin. She is not regular with glucophage due to diarrhea. She has tolerated lower doses better (500mg  once and twice daily). She wants her A1cs to run <7%. She endorses occasional symptomatic low sugars. Lab Results  Component Value Date   HGBA1C 7.2* 05/26/2015    HLD - LDL 194 during hospitalization, off statin. Worried about statin drugs because siblings have not tolerated.   SLE with h/o osteoarthritis - has not recently seen rheumatologist. Prior on plaquenil. Prior saw Dr Benetta Spar rheum in [place].   DATE OF ADMISSION: 05/26/2015 DATE OF DISCHARGE: 05/27/2015 Transitional care phone call: 05/28/2015 ADMITTING PHYSICIAN: Harrie Foreman, MD  Discharge Diagnosis: Chest pain  Lives in [place] now. Recently moved from [place]. Lives with husband. No pets. 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 Interim medical history since our last visit reviewed. Allergies and medications reviewed and updated. Current Outpatient Prescriptions on File Prior to Visit  Medication Sig  . BD ULTRA-FINE LANCETS lancets Use as instructed upto 6 times daily  . Cholecalciferol 50000 UNITS TABS Take 25,000 mg by mouth every 14 (fourteen) days. Take one tablet by mouth every 14 days (2 weeks)  . Cyanocobalamin (B-12 SL) Place 1 tablet under the tongue daily.   . insulin aspart (NOVOLOG) 100 UNIT/ML injection Use in an insulin pump upto 50 units per day (Patient taking differently: Inject 23.5-27 Units into the skin. Use in an insulin pump upto 50 units per day)  . LEVOXYL 75 MCG tablet TAKE 1 TABLET 6 DAYS A WEEK, AND 1.5 TABLETS BY MOUTH ONE DAY A WEEK.  Marland Kitchen losartan (COZAAR) 50 MG tablet Take 1 tablet (50 mg total) by mouth daily.   No current facility-administered medications on file prior to visit.    Review of Systems Per HPI unless specifically indicated above     Objective:    BP 126/66 mmHg  Pulse 96  Temp(Src) 98.2 F (36.8 C) (Oral)  Wt 162 lb 4 oz (73.596 kg)  Wt Readings from Last 3 Encounters:  06/02/15 162 lb 4 oz (73.596 kg)  05/27/15 162 lb (73.483 kg)  09/25/14 162 lb 8 oz (73.71 kg)    Physical Exam  Constitutional: She appears well-developed and well-nourished. No distress.  HENT:  Mouth/Throat: Oropharynx is clear and moist. No oropharyngeal exudate.  Eyes: Conjunctivae and EOM are normal. Pupils are equal, round, and reactive to light. No scleral icterus.  Neck: Normal range of motion. Neck supple.  Cardiovascular: Normal rate,  regular rhythm, normal heart sounds and intact distal pulses.   No murmur heard. Pulmonary/Chest: Effort normal and breath sounds normal. No respiratory distress. She has no wheezes. She has no rales. She exhibits tenderness (mid sternal pain to palpation, mild costochondral discomfort).  Musculoskeletal: She exhibits no edema.  Skin: Skin is warm and dry. No rash noted.  Psychiatric: She has a normal mood and affect.  Tearful with discussion of grandson's suicide 2014  Nursing note and vitals reviewed.      Assessment & Plan:   Problem List Items Addressed This Visit    Diabetes type 2, controlled    Continue f/u with [facility] - pt will research [facility] and let us know if desires referral. Pt doesn't want to return to see Dr Gabriel Carina (has seen once before).      Relevant Medications   metFORMIN (GLUCOPHAGE-XR) 500 MG 24 hr tablet   Essential hypertension    Chronic, stable. Continue current regimen.      Hypothyroidism    Chronic, stable. Continue levoxyl 30mcg daily.      SLE (systemic lupus erythematosus)    Will review records in chart. Did not tolerate plaquenil (diarrhea). Consider referral back to rheum.      Hyperlipidemia    LDL too high - discussed with patient. She remains hesitant for statin given family intolerance to this med family. Continue to discuss at future visits. Did not tolerate zetia 2/2 diarrhea as well.      Chest pain - Primary    Overall atypical, however given significant risk factors did recommend referral to cards to further risk stratify. Pt agrees with plan. Consider starting ASA.  Possible MSK cause - ?arthritis or lupus causing sxs.      Relevant Orders   Ambulatory referral to Cardiology   Family history of premature CAD   Relevant Orders   Ambulatory referral to Cardiology    Other Visit Diagnoses    Lupus (systemic lupus erythematosus)            Follow up plan: No Follow-up on file.

## 2015-06-02 NOTE — Assessment & Plan Note (Signed)
Chronic, stable. Continue current regimen. 

## 2015-06-02 NOTE — Patient Instructions (Addendum)
[name] - Dr Benjiman Core, Dr Elayne Snare, Dr Renato Shin. Let us know if you'd like a referral to endocrinologist.  Let us know if you'd like rheumatology referral.  We will refer you to cardiology in [place].  Decrease metformin XR to 500mg  twice daily. We want to avoid hypoglycemia.  Keep upcoming physical appointment.  Nice to meet you today, call us with questions.

## 2015-06-02 NOTE — Assessment & Plan Note (Addendum)
Overall atypical, however given significant risk factors did recommend referral to cards to further risk stratify. Pt agrees with plan. Consider starting ASA.  Possible MSK cause - ?arthritis or lupus causing sxs.

## 2015-06-02 NOTE — Assessment & Plan Note (Signed)
LDL too high - discussed with patient. She remains hesitant for statin given family intolerance to this med family. Continue to discuss at future visits. Did not tolerate zetia 2/2 diarrhea as well.

## 2015-06-02 NOTE — Assessment & Plan Note (Signed)
Chronic, stable. Continue levoxyl 70mcg daily.

## 2015-06-02 NOTE — Assessment & Plan Note (Signed)
Continue f/u with [facility] - pt will research [facility] and let us know if desires referral. Pt doesn't want to return to see Dr Gabriel Carina (has seen once before).

## 2015-06-07 ENCOUNTER — Other Ambulatory Visit: Payer: Self-pay | Admitting: Family Medicine

## 2015-06-07 DIAGNOSIS — R5383 Other fatigue: Secondary | ICD-10-CM

## 2015-06-07 DIAGNOSIS — E039 Hypothyroidism, unspecified: Secondary | ICD-10-CM

## 2015-06-08 ENCOUNTER — Other Ambulatory Visit (INDEPENDENT_AMBULATORY_CARE_PROVIDER_SITE_OTHER): Payer: Medicare PPO

## 2015-06-08 DIAGNOSIS — E559 Vitamin D deficiency, unspecified: Secondary | ICD-10-CM

## 2015-06-08 DIAGNOSIS — E039 Hypothyroidism, unspecified: Secondary | ICD-10-CM

## 2015-06-08 DIAGNOSIS — R5383 Other fatigue: Secondary | ICD-10-CM | POA: Diagnosis not present

## 2015-06-08 DIAGNOSIS — Z79899 Other long term (current) drug therapy: Secondary | ICD-10-CM | POA: Diagnosis not present

## 2015-06-08 LAB — VITAMIN B12: VITAMIN B 12: 709 pg/mL (ref 211–911)

## 2015-06-08 LAB — VITAMIN D 25 HYDROXY (VIT D DEFICIENCY, FRACTURES): VITD: 41.44 ng/mL (ref 30.00–100.00)

## 2015-06-08 LAB — TSH: TSH: 2.9 u[IU]/mL (ref 0.35–4.50)

## 2015-06-08 LAB — T4, FREE: Free T4: 0.89 ng/dL (ref 0.60–1.60)

## 2015-06-13 ENCOUNTER — Encounter: Payer: Self-pay | Admitting: Family Medicine

## 2015-06-13 DIAGNOSIS — E559 Vitamin D deficiency, unspecified: Secondary | ICD-10-CM | POA: Insufficient documentation

## 2015-06-16 ENCOUNTER — Encounter: Payer: 59 | Admitting: Family Medicine

## 2015-06-19 ENCOUNTER — Ambulatory Visit (INDEPENDENT_AMBULATORY_CARE_PROVIDER_SITE_OTHER): Payer: Medicare PPO | Admitting: Family Medicine

## 2015-06-19 ENCOUNTER — Encounter: Payer: Self-pay | Admitting: Family Medicine

## 2015-06-19 VITALS — BP 160/78 | HR 100 | Temp 97.8°F | Resp 16 | Wt 163.5 lb

## 2015-06-19 DIAGNOSIS — Z0000 Encounter for general adult medical examination without abnormal findings: Secondary | ICD-10-CM | POA: Insufficient documentation

## 2015-06-19 DIAGNOSIS — I10 Essential (primary) hypertension: Secondary | ICD-10-CM

## 2015-06-19 DIAGNOSIS — Z0001 Encounter for general adult medical examination with abnormal findings: Secondary | ICD-10-CM | POA: Insufficient documentation

## 2015-06-19 DIAGNOSIS — E785 Hyperlipidemia, unspecified: Secondary | ICD-10-CM

## 2015-06-19 DIAGNOSIS — I6529 Occlusion and stenosis of unspecified carotid artery: Secondary | ICD-10-CM | POA: Insufficient documentation

## 2015-06-19 DIAGNOSIS — Z7189 Other specified counseling: Secondary | ICD-10-CM | POA: Insufficient documentation

## 2015-06-19 DIAGNOSIS — R7989 Other specified abnormal findings of blood chemistry: Secondary | ICD-10-CM

## 2015-06-19 DIAGNOSIS — R0989 Other specified symptoms and signs involving the circulatory and respiratory systems: Secondary | ICD-10-CM

## 2015-06-19 DIAGNOSIS — E119 Type 2 diabetes mellitus without complications: Secondary | ICD-10-CM

## 2015-06-19 DIAGNOSIS — E039 Hypothyroidism, unspecified: Secondary | ICD-10-CM

## 2015-06-19 DIAGNOSIS — E559 Vitamin D deficiency, unspecified: Secondary | ICD-10-CM

## 2015-06-19 HISTORY — DX: Other specified symptoms and signs involving the circulatory and respiratory systems: R09.89

## 2015-06-19 MED ORDER — METFORMIN HCL ER 500 MG PO TB24: 500.0000 mg | ORAL_TABLET | Freq: Two times a day (BID) | ORAL | Status: DC

## 2015-06-19 NOTE — Assessment & Plan Note (Signed)

## 2015-06-19 NOTE — Progress Notes (Addendum)
BP 160/78 mmHg  Pulse 100  Temp(Src) 97.8 F (36.6 C) (Oral)  Resp 16  Wt 163 lb 8 oz (74.163 kg)  SpO2 98%   CC: medicare wellness visit  Subjective:    Patient ID: Cassandra Benson, female    DOB: 23-May-1941, 74 y.o.   MRN: [id number]  HPI: Cassandra Benson is a 74 y.o. female presenting on 06/19/2015 for Medicare Wellness exam   BP Readings from Last 3 Encounters:  06/19/15 160/78  06/02/15 126/66  05/27/15 134/67   HLD - hesitant for statins due to fmhx statin intolerance.  F/u appt 10/24 with Dr Rockey Situ.  Intermittent dry cough present for last 7-8 days.  Hearing screen - passed Vision screen - passed Fall risk screen - passed Depression screen - passed  Preventative: Colon cancer screening - normal 09/2012 per prior records, good for 10 yrs ([facility]) Breast cancer screening - mammo WNL 07/2014 Well woman exam - last 2013 s/p abd hysterectomy, ovaries remain.  DEXA scan - normal per patient 04/2012 Flu shot - ALLERGIC Tdap 07/2012.  Pneumovax 2014, prevnar 2015.  Shingles shot - declines. Has had shingles.  Advanced directive discussion - has at home. HCPOA is husband then son Catalina [place] and [place]). Will bring me copy. Seat belt use discussed Sunscreen use and skin screen discussed, no changing moles on skin  Lives in [place] now. Recently moved from [place]. Lives with husband. No pets. 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 Interim medical history since our last visit reviewed. Allergies and medications reviewed and updated. Current Outpatient Prescriptions on File Prior to Visit  Medication Sig  . BD ULTRA-FINE LANCETS lancets Use as instructed  upto 6 times daily  . Cholecalciferol 50000 UNITS TABS Take 25,000 mg by mouth every 14 (fourteen) days. Take one tablet by mouth every 14 days (2 weeks)  . Cyanocobalamin (B-12 SL) Place 1 tablet under the tongue daily.   Marland Kitchen ibuprofen (ADVIL,MOTRIN) 200 MG tablet Take 600 mg by mouth every 8 (eight) hours as needed.  . insulin aspart (NOVOLOG) 100 UNIT/ML injection Use in an insulin pump upto 50 units per day (Patient taking differently: Inject 23.5-27 Units into the skin. Use in an insulin pump upto 50 units per day)  . LEVOXYL 75 MCG tablet TAKE 1 TABLET 6 DAYS A WEEK, AND 1.5 TABLETS BY MOUTH ONE DAY A WEEK.  Marland Kitchen losartan (COZAAR) 50 MG tablet Take 1 tablet (50 mg total) by mouth daily.   No current facility-administered medications on file prior to visit.    Review of Systems  Constitutional: Negative for fever, chills, activity change, appetite change, fatigue and unexpected weight change.  HENT: Negative for hearing loss.   Eyes: Negative for visual disturbance.  Respiratory: Positive for cough and shortness of breath (intermittent). Negative for chest tightness and wheezing.   Cardiovascular: Negative for chest pain, palpitations and leg swelling.  Gastrointestinal: Negative for nausea, vomiting, abdominal pain, diarrhea, constipation, blood in stool and abdominal distention.  Genitourinary: Negative for hematuria and difficulty urinating.  Musculoskeletal: Negative for myalgias, arthralgias and neck pain.  Skin: Negative for rash.  Neurological: Positive for headaches. Negative for dizziness, seizures and syncope.  Hematological: Negative for adenopathy. Does not bruise/bleed  easily.  Psychiatric/Behavioral: Negative for dysphoric mood. The patient is not nervous/anxious.    Per HPI unless specifically indicated above     Objective:    BP 160/78 mmHg  Pulse 100  Temp(Src) 97.8 F (36.6 C) (Oral)  Resp 16  Wt 163 lb 8 oz (74.163 kg)  SpO2 98%  Wt Readings from Last 3  Encounters:  06/19/15 163 lb 8 oz (74.163 kg)  06/02/15 162 lb 4 oz (73.596 kg)  05/27/15 162 lb (73.483 kg)    Physical Exam  Constitutional: She is oriented to person, place, and time. She appears well-developed and well-nourished. No distress.  HENT:  Head: Normocephalic and atraumatic.  Right Ear: Hearing, tympanic membrane, external ear and ear canal normal.  Left Ear: Hearing, tympanic membrane, external ear and ear canal normal.  Nose: Nose normal.  Mouth/Throat: Uvula is midline, oropharynx is clear and moist and mucous membranes are normal. No oropharyngeal exudate, posterior oropharyngeal edema or posterior oropharyngeal erythema.  Eyes: Conjunctivae and EOM are normal. Pupils are equal, round, and reactive to light. No scleral icterus.  Neck: Normal range of motion. Neck supple. Carotid bruit is present (left carotid bruit). No thyromegaly present.  Cardiovascular: Normal rate, regular rhythm, normal heart sounds and intact distal pulses.   No murmur heard. Pulses:      Radial pulses are 2+ on the right side, and 2+ on the left side.  Pulmonary/Chest: Effort normal and breath sounds normal. No respiratory distress. She has no wheezes. She has no rales.  Breast - deferred  Abdominal: Soft. Bowel sounds are normal. She exhibits no distension and no mass. There is no tenderness. There is no rebound and no guarding.  Genitourinary:  GYN - age out  Musculoskeletal: Normal range of motion. She exhibits no edema.  Lymphadenopathy:    She has no cervical adenopathy.  Neurological: She is alert and oriented to person, place, and time.  CN grossly intact, station and gait intact Recall 3/3  Calculation 5/5 serial 7s  Skin: Skin is warm and dry. No rash noted.  Psychiatric: She has a normal mood and affect. Her behavior is normal. Judgment and thought content normal.  Nursing note and vitals reviewed.  Results for orders placed or performed in visit on 06/13/15  HM MAMMOGRAPHY    Result Value Ref Range   HM Mammogram normal per patient   HM DEXA SCAN  Result Value Ref Range   HM Dexa Scan normal per patient       Assessment & Plan:   Problem List Items Addressed This Visit    Vitamin D deficiency    Better control on current regimen.      Medicare annual wellness visit, subsequent - Primary    I have personally reviewed the Medicare Annual Wellness questionnaire and have noted 1. The patient's medical and social history 2. Their use of alcohol, tobacco or illicit drugs 3. Their current medications and supplements 4. The patient's functional ability including ADL's, fall risks, home safety risks and hearing or visual impairment. Cognitive function has been assessed and addressed as indicated.  5. Diet and physical activity 6. Evidence for depression or mood disorders The patients weight, height, BMI have been recorded in the chart. I have made referrals, counseling and provided education to the patient based on review of the above and I have provided the pt with a written personalized care plan for preventive services. Provider list updated.. See scanned questionairre as needed for further documentation. Reviewed preventative protocols  and updated unless pt declined.       Left carotid bruit    Will schedule carotid US for bruit heard today.      Relevant Orders   Carotid   Hypothyroidism    Chronic, TSH stable.       Hyperlipidemia    rec start RYR 600mg  BID. Rec statin. Pt will discuss statin with Dr Rockey Situ.       Relevant Medications   aspirin EC 81 MG tablet   Health maintenance examination    Preventative protocols reviewed and updated unless pt declined. Discussed healthy diet and lifestyle.       Essential hypertension    Elevated today. Pt will monitor at home and let us know if persistently >140/90 for med titration.      Relevant Medications   aspirin EC 81 MG tablet   Elevated testosterone level in female    Will f/u with  endo when she establishes locally.      Diabetes type 2, controlled    Looking to establish with local endo - will call us when desires referra.      Relevant Medications   metFORMIN (GLUCOPHAGE-XR) 500 MG 24 hr tablet   aspirin EC 81 MG tablet   Advanced care planning/counseling discussion    Advanced directive discussion - has at home. HCPOA is husband then son Catalina [place] and [place]). Will bring me copy.          Follow up plan: Return in about 3 months (around 09/18/2015), or as neeeed, for follow up visit.

## 2015-06-19 NOTE — Patient Instructions (Addendum)
Consider red yeast rice twice daily or statin daily for cholesterol. Bring me copy of advanced directive to update your chart. Start monitoring blood pressure more closely and let me know if >140/90 consistently. We will order neck ultrasound to evaluate carotid arteries.  Return in 3 months for follow up visit  Health Maintenance Adopting a healthy lifestyle and getting preventive care can go a long way to promote health and wellness. Talk with your health care provider about what schedule of regular examinations is right for you. This is a good chance for you to check in with your provider about disease prevention and staying healthy. In between checkups, there are plenty of things you can do on your own. Experts have done a lot of research about which lifestyle changes and preventive measures are most likely to keep you healthy. Ask your health care provider for more information. WEIGHT AND DIET  Eat a healthy diet  Be sure to include plenty of vegetables, fruits, low-fat dairy products, and lean protein.  Do not eat a lot of foods high in solid fats, added sugars, or salt.  Get regular exercise. This is one of the most important things you can do for your health.  Most adults should exercise for at least 150 minutes each week. The exercise should increase your heart rate and make you sweat (moderate-intensity exercise).  Most adults should also do strengthening exercises at least twice a week. This is in addition to the moderate-intensity exercise.  Maintain a healthy weight  Body mass index (BMI) is a measurement that can be used to identify possible weight problems. It estimates body fat based on height and weight. Your health care provider can help determine your BMI and help you achieve or maintain a healthy weight.  For females 71 years of age and older:   A BMI below 18.5 is considered underweight.  A BMI of 18.5 to 24.9 is normal.  A BMI of 25 to 29.9 is considered  overweight.  A BMI of 30 and above is considered obese.  Watch levels of cholesterol and blood lipids  You should start having your blood tested for lipids and cholesterol at 73 years of age, then have this test every 5 years.  You may need to have your cholesterol levels checked more often if:  Your lipid or cholesterol levels are high.  You are older than 73 years of age.  You are at high risk for heart disease.  CANCER SCREENING   Lung Cancer  Lung cancer screening is recommended for adults 44-3 years old who are at high risk for lung cancer because of a history of smoking.  A yearly low-dose CT scan of the lungs is recommended for people who:  Currently smoke.  Have quit within the past 15 years.  Have at least a 30-pack-year history of smoking. A pack year is smoking an average of one pack of cigarettes a day for 1 year.  Yearly screening should continue until it has been 15 years since you quit.  Yearly screening should stop if you develop a health problem that would prevent you from having lung cancer treatment.  Breast Cancer  Practice breast self-awareness. This means understanding how your breasts normally appear and feel.  It also means doing regular breast self-exams. Let your health care provider know about any changes, no matter how small.  If you are in your 20s or 30s, you should have a clinical breast exam (CBE) by a health care provider every  1-3 years as part of a regular health exam.  If you are 31 or older, have a CBE every year. Also consider having a breast X-ray (mammogram) every year.  If you have a family history of breast cancer, talk to your health care provider about genetic screening.  If you are at high risk for breast cancer, talk to your health care provider about having an MRI and a mammogram every year.  Breast cancer gene (BRCA) assessment is recommended for women who have family members with BRCA-related cancers. BRCA-related  cancers include:  Breast.  Ovarian.  Tubal.  Peritoneal cancers.  Results of the assessment will determine the need for genetic counseling and BRCA1 and BRCA2 testing. Cervical Cancer Routine pelvic examinations to screen for cervical cancer are no longer recommended for nonpregnant women who are considered low risk for cancer of the pelvic organs (ovaries, uterus, and vagina) and who do not have symptoms. A pelvic examination may be necessary if you have symptoms including those associated with pelvic infections. Ask your health care provider if a screening pelvic exam is right for you.   The Pap test is the screening test for cervical cancer for women who are considered at risk.  If you had a hysterectomy for a problem that was not cancer or a condition that could lead to cancer, then you no longer need Pap tests.  If you are older than 65 years, and you have had normal Pap tests for the past 10 years, you no longer need to have Pap tests.  If you have had past treatment for cervical cancer or a condition that could lead to cancer, you need Pap tests and screening for cancer for at least 20 years after your treatment.  If you no longer get a Pap test, assess your risk factors if they change (such as having a new sexual partner). This can affect whether you should start being screened again.  Some women have medical problems that increase their chance of getting cervical cancer. If this is the case for you, your health care provider may recommend more frequent screening and Pap tests.  The human papillomavirus (HPV) test is another test that may be used for cervical cancer screening. The HPV test looks for the virus that can cause cell changes in the cervix. The cells collected during the Pap test can be tested for HPV.  The HPV test can be used to screen women 71 years of age and older. Getting tested for HPV can extend the interval between normal Pap tests from three to five  years.  An HPV test also should be used to screen women of any age who have unclear Pap test results.  After 72 years of age, women should have HPV testing as often as Pap tests.  Colorectal Cancer  This type of cancer can be detected and often prevented.  Routine colorectal cancer screening usually begins at 73 years of age and continues through 73 years of age.  Your health care provider may recommend screening at an earlier age if you have risk factors for colon cancer.  Your health care provider may also recommend using home test kits to check for hidden blood in the stool.  A small camera at the end of a tube can be used to examine your colon directly (sigmoidoscopy or colonoscopy). This is done to check for the earliest forms of colorectal cancer.  Routine screening usually begins at age 18.  Direct examination of the colon should be  repeated every 5-10 years through 73 years of age. However, you may need to be screened more often if early forms of precancerous polyps or small growths are found. Skin Cancer  Check your skin from head to toe regularly.  Tell your health care provider about any new moles or changes in moles, especially if there is a change in a mole's shape or color.  Also tell your health care provider if you have a mole that is larger than the size of a pencil eraser.  Always use sunscreen. Apply sunscreen liberally and repeatedly throughout the day.  Protect yourself by wearing long sleeves, pants, a wide-brimmed hat, and sunglasses whenever you are outside. HEART DISEASE, DIABETES, AND HIGH BLOOD PRESSURE   Have your blood pressure checked at least every 1-2 years. High blood pressure causes heart disease and increases the risk of stroke.  If you are between 5 years and 36 years old, ask your health care provider if you should take aspirin to prevent strokes.  Have regular diabetes screenings. This involves taking a blood sample to check your fasting  blood sugar level.  If you are at a normal weight and have a low risk for diabetes, have this test once every three years after 73 years of age.  If you are overweight and have a high risk for diabetes, consider being tested at a younger age or more often. PREVENTING INFECTION  Hepatitis B  If you have a higher risk for hepatitis B, you should be screened for this virus. You are considered at high risk for hepatitis B if:  You were born in a country where hepatitis B is common. Ask your health care provider which countries are considered high risk.  Your parents were born in a high-risk country, and you have not been immunized against hepatitis B (hepatitis B vaccine).  You have HIV or AIDS.  You use needles to inject street drugs.  You live with someone who has hepatitis B.  You have had sex with someone who has hepatitis B.  You get hemodialysis treatment.  You take certain medicines for conditions, including cancer, organ transplantation, and autoimmune conditions. Hepatitis C  Blood testing is recommended for:  Everyone born from [birth year] through 1964.  Anyone with known risk factors for hepatitis C. Sexually transmitted infections (STIs)  You should be screened for sexually transmitted infections (STIs) including gonorrhea and chlamydia if:  You are sexually active and are younger than 73 years of age.  You are older than 73 years of age and your health care provider tells you that you are at risk for this type of infection.  Your sexual activity has changed since you were last screened and you are at an increased risk for chlamydia or gonorrhea. Ask your health care provider if you are at risk.  If you do not have HIV, but are at risk, it may be recommended that you take a prescription medicine daily to prevent HIV infection. This is called pre-exposure prophylaxis (PrEP). You are considered at risk if:  You are sexually active and do not regularly use condoms or know  the HIV status of your partner(s).  You take drugs by injection.  You are sexually active with a partner who has HIV. Talk with your health care provider about whether you are at high risk of being infected with HIV. If you choose to begin PrEP, you should first be tested for HIV. You should then be tested every 3 months for as long  as you are taking PrEP.  PREGNANCY   If you are premenopausal and you may become pregnant, ask your health care provider about preconception counseling.  If you may become pregnant, take 400 to 800 micrograms (mcg) of folic acid every day.  If you want to prevent pregnancy, talk to your health care provider about birth control (contraception). OSTEOPOROSIS AND MENOPAUSE   Osteoporosis is a disease in which the bones lose minerals and strength with aging. This can result in serious bone fractures. Your risk for osteoporosis can be identified using a bone density scan.  If you are 39 years of age or older, or if you are at risk for osteoporosis and fractures, ask your health care provider if you should be screened.  Ask your health care provider whether you should take a calcium or vitamin D supplement to lower your risk for osteoporosis.  Menopause may have certain physical symptoms and risks.  Hormone replacement therapy may reduce some of these symptoms and risks. Talk to your health care provider about whether hormone replacement therapy is right for you.  HOME CARE INSTRUCTIONS   Schedule regular health, dental, and eye exams.  Stay current with your immunizations.   Do not use any tobacco products including cigarettes, chewing tobacco, or electronic cigarettes.  If you are pregnant, do not drink alcohol.  If you are breastfeeding, limit how much and how often you drink alcohol.  Limit alcohol intake to no more than 1 drink per day for nonpregnant women. One drink equals 12 ounces of beer, 5 ounces of wine, or 1 ounces of hard liquor.  Do not  use street drugs.  Do not share needles.  Ask your health care provider for help if you need support or information about quitting drugs.  Tell your health care provider if you often feel depressed.  Tell your health care provider if you have ever been abused or do not feel safe at home. Document Released: 03/28/2011 Document Revised: 01/27/2014 Document Reviewed: 08/14/2013 [facility] Patient Information 2015 [place], [place]. This information is not intended to replace advice given to you by your health care provider. Make sure you discuss any questions you have with your health care provider.

## 2015-06-19 NOTE — Assessment & Plan Note (Signed)
Looking to establish with local endo - will call us when desires referra.

## 2015-06-19 NOTE — Progress Notes (Signed)
Pre visit review using our clinic review tool, if applicable. No additional management support is needed unless otherwise documented below in the visit note.k

## 2015-06-19 NOTE — Assessment & Plan Note (Addendum)
Will schedule carotid US for bruit heard today.

## 2015-06-19 NOTE — Assessment & Plan Note (Signed)
Elevated today. Pt will monitor at home and let us know if persistently >140/90 for med titration.

## 2015-06-19 NOTE — Assessment & Plan Note (Signed)
Will f/u with endo when she establishes locally.

## 2015-06-19 NOTE — Assessment & Plan Note (Signed)
Chronic, TSH stable.

## 2015-06-19 NOTE — Assessment & Plan Note (Signed)
Preventative protocols reviewed and updated unless pt declined. Discussed healthy diet and lifestyle.  

## 2015-06-19 NOTE — Assessment & Plan Note (Signed)
rec start RYR 600mg  BID. Rec statin. Pt will discuss statin with Dr Rockey Situ.

## 2015-06-19 NOTE — Assessment & Plan Note (Signed)
Better control on current regimen.

## 2015-06-19 NOTE — Assessment & Plan Note (Signed)
Advanced directive discussion - has at home. HCPOA is husband then son Catalina [place] and [place]). Will bring me copy.

## 2015-06-24 ENCOUNTER — Encounter: Payer: Self-pay | Admitting: *Deleted

## 2015-06-25 ENCOUNTER — Encounter (HOSPITAL_COMMUNITY): Payer: Medicare PPO

## 2015-06-25 ENCOUNTER — Ambulatory Visit (HOSPITAL_COMMUNITY)
Admission: RE | Admit: 2015-06-25 | Discharge: 2015-06-25 | Disposition: A | Discharge status: Home / Self Care | Payer: Medicare PPO | Source: Ambulatory Visit | Attending: Cardiovascular Disease | Admitting: Cardiovascular Disease

## 2015-06-25 DIAGNOSIS — R0989 Other specified symptoms and signs involving the circulatory and respiratory systems: Secondary | ICD-10-CM | POA: Insufficient documentation

## 2015-06-25 DIAGNOSIS — E119 Type 2 diabetes mellitus without complications: Secondary | ICD-10-CM | POA: Insufficient documentation

## 2015-06-25 DIAGNOSIS — I10 Essential (primary) hypertension: Secondary | ICD-10-CM | POA: Insufficient documentation

## 2015-06-25 DIAGNOSIS — I6523 Occlusion and stenosis of bilateral carotid arteries: Secondary | ICD-10-CM | POA: Diagnosis not present

## 2015-06-25 DIAGNOSIS — E785 Hyperlipidemia, unspecified: Secondary | ICD-10-CM | POA: Diagnosis not present

## 2015-06-26 ENCOUNTER — Encounter: Payer: Self-pay | Admitting: Family Medicine

## 2015-07-13 ENCOUNTER — Encounter: Payer: Self-pay | Admitting: Family Medicine

## 2015-07-13 ENCOUNTER — Ambulatory Visit (INDEPENDENT_AMBULATORY_CARE_PROVIDER_SITE_OTHER): Payer: Medicare PPO | Admitting: Family Medicine

## 2015-07-13 VITALS — BP 118/62 | HR 100 | Temp 98.3°F | Wt 162.5 lb

## 2015-07-13 DIAGNOSIS — J209 Acute bronchitis, unspecified: Secondary | ICD-10-CM | POA: Insufficient documentation

## 2015-07-13 DIAGNOSIS — R8290 Unspecified abnormal findings in urine: Secondary | ICD-10-CM | POA: Diagnosis not present

## 2015-07-13 LAB — POCT URINALYSIS DIPSTICK
Bilirubin, UA: NEGATIVE
GLUCOSE UA: NEGATIVE
Ketones, UA: NEGATIVE
LEUKOCYTES UA: NEGATIVE
NITRITE UA: NEGATIVE
Protein, UA: NEGATIVE
Spec Grav, UA: 1.025
UROBILINOGEN UA: 0.2
pH, UA: 6

## 2015-07-13 MED ORDER — AZITHROMYCIN 250 MG PO TABS: ORAL_TABLET | ORAL | Status: DC

## 2015-07-13 MED ORDER — HYDROCOD POLST-CPM POLST ER 10-8 MG/5ML PO SUER: 5.0000 mL | Freq: Every evening | ORAL | Status: DC | PRN

## 2015-07-13 NOTE — Progress Notes (Signed)
BP 118/62 mmHg  Pulse 100  Temp(Src) 98.3 F (36.8 C) (Tympanic)  Wt 162 lb 8 oz (73.71 kg)  SpO2 96%   CC: cough, UTI?  Subjective:    Patient ID: Cassandra Benson, female    DOB: [birth year], 74 y.o.   MRN: [id number]  HPI: Cassandra Benson is a 74 y.o. female presenting on 07/13/2015 for Cough and Urinary Tract Infection   1 wk h/o chest congestion with cough. Mildly productive. Has tried husband's tussionex which helps and cough drops. Low grade fever. + coughing fits. Some dyspnea with wheeze.   No ear or tooth pain, PNdrainage, body aches, abd pain, nausea. No head congestion or rhinorrhea.   No sick contacts at home No smokers at home. No h/o asthma, COPD.   H/o PNA 2 yrs ago. Started like this.  Flu shot allergy.   Urine has started smelling more foul over last 3 days as well as dark urine. No dysuria, urgency, frequency, hematuria or flank pain. No new foods.   Relevant past medical, surgical, family and social history reviewed and updated as indicated. Interim medical history since our last visit reviewed. Allergies and medications reviewed and updated. Current Outpatient Prescriptions on File Prior to Visit  Medication Sig  . aspirin EC 81 MG tablet Take 81 mg by mouth daily.  . BD ULTRA-FINE LANCETS lancets Use as instructed upto 6 times daily  . Cholecalciferol 50000 UNITS TABS Take 25,000 mg by mouth every 14 (fourteen) days. Take one tablet by mouth every 14 days (2 weeks)  . Cyanocobalamin (B-12 SL) Place 1 tablet under the tongue daily.   Marland Kitchen ibuprofen (ADVIL,MOTRIN) 200 MG tablet Take 600 mg by mouth every 8 (eight) hours as needed.  . insulin aspart (NOVOLOG) 100 UNIT/ML injection Use in an insulin pump upto 50 units per day (Patient taking differently: Inject 23.5-27 Units into the skin. Use in an insulin pump upto 50 units per day)  . Lecithin 400 MG CAPS Take 1 capsule by mouth daily.  Marland Kitchen LEVOXYL 75 MCG tablet TAKE 1 TABLET 6 DAYS A WEEK, AND 1.5 TABLETS BY  MOUTH ONE DAY A WEEK.  Marland Kitchen losartan (COZAAR) 50 MG tablet Take 1 tablet (50 mg total) by mouth daily.  . metFORMIN (GLUCOPHAGE-XR) 500 MG 24 hr tablet Take 1 tablet (500 mg total) by mouth 2 (two) times daily.  . Red Yeast Rice 600 MG CAPS Take 1 capsule by mouth 2 (two) times daily.   No current facility-administered medications on file prior to visit.   Past Medical History  Diagnosis Date  . Arthritis   . History of chicken pox   . History of shingles   . Diabetes mellitus without complication ([place])   . History of diverticulitis   . Glaucoma     Narrow angle  . Allergy   . Hyperlipidemia   . Hypothyroidism   . History of UTI   . History of blood clots     DVT, in 20s, none since  . Lupus (systemic lupus erythematosus) ([place])   . History of pericarditis     with hospitalization  . History of pneumonia 2014  . Vitamin D deficiency     prior PCP  . Fibromyalgia     prior PCP  . Sleep apnea     prior PCP  . GERD (gastroesophageal reflux disease)     prior PCP  . Shoulder pain left    h/o RTC tendonitis and adhesive capsulitis  . Carotid  stenosis, asymptomatic 06/19/2015    5-63% RICA 89-37% LICA rpt 1 yr (11/4285)     Review of Systems Per HPI unless specifically indicated above     Objective:    BP 118/62 mmHg  Pulse 100  Temp(Src) 98.3 F (36.8 C) (Tympanic)  Wt 162 lb 8 oz (73.71 kg)  SpO2 96%  Wt Readings from Last 3 Encounters:  07/13/15 162 lb 8 oz (73.71 kg)  06/19/15 163 lb 8 oz (74.163 kg)  06/02/15 162 lb 4 oz (73.596 kg)    Physical Exam  Constitutional: She appears well-developed and well-nourished. No distress.  HENT:  Head: Normocephalic and atraumatic.  Right Ear: Hearing, tympanic membrane, external ear and ear canal normal.  Left Ear: Hearing, tympanic membrane, external ear and ear canal normal.  Nose: No mucosal edema or rhinorrhea. Right sinus exhibits no maxillary sinus tenderness and no frontal sinus tenderness. Left sinus exhibits no  maxillary sinus tenderness and no frontal sinus tenderness.  Mouth/Throat: Uvula is midline and mucous membranes are normal. Posterior oropharyngeal erythema (mild) present. No oropharyngeal exudate, posterior oropharyngeal edema or tonsillar abscesses.  Eyes: Conjunctivae and EOM are normal. Pupils are equal, round, and reactive to light. No scleral icterus.  Neck: Normal range of motion. Neck supple.  Cardiovascular: Normal rate, regular rhythm, normal heart sounds and intact distal pulses.   No murmur heard. Pulmonary/Chest: Effort normal and breath sounds normal. No respiratory distress. She has no wheezes. She has no rales.  Nagging cough  Abdominal: Soft. Normal appearance and bowel sounds are normal. She exhibits no distension and no mass. There is no hepatosplenomegaly. There is no tenderness. There is CVA tenderness (chronic (?FM)). There is no rigidity, no rebound, no guarding and negative Murphy's sign.  Lymphadenopathy:    She has no cervical adenopathy.  Skin: Skin is warm and dry. No rash noted.  Nursing note and vitals reviewed.  Results for orders placed or performed in visit on 07/13/15  POCT Urinalysis Dipstick  Result Value Ref Range   Color, UA Yellow    Clarity, UA Clear    Glucose, UA Negative    Bilirubin, UA Negative    Ketones, UA Negative    Spec Grav, UA 1.025    Blood, UA Trace    pH, UA 6.0    Protein, UA Negative    Urobilinogen, UA 0.2    Nitrite, UA Negative    Leukocytes, UA Negative Negative      Assessment & Plan:   Problem List Items Addressed This Visit    Bad odor of urine    UA today with tr blood but micro WNL.  Pt reassured.      Relevant Orders   POCT Urinalysis Dipstick (Completed)   Acute bronchitis - Primary    Anticipate acute bronchitis - discussed this. Given h/o PNA, coughing fits and comorbidities will treat aggressively with zpack and tussionex. Further supportive care discussed. Update if not improving with treatment           Follow up plan: Return if symptoms worsen or fail to improve.

## 2015-07-13 NOTE — Assessment & Plan Note (Signed)
UA today with tr blood but micro WNL.  Pt reassured.

## 2015-07-13 NOTE — Assessment & Plan Note (Signed)
Anticipate acute bronchitis - discussed this. Given h/o PNA, coughing fits and comorbidities will treat aggressively with zpack and tussionex. Further supportive care discussed. Update if not improving with treatment

## 2015-07-13 NOTE — Patient Instructions (Signed)
I do think we have bronchitis. Treat with zpack and tussionex. Push fluids and rest. Let us know if fever >101, worsening productive cough, or not improving over next week. For urine - looking ok today - push fluids.

## 2015-07-13 NOTE — Progress Notes (Signed)
Pre visit review using our clinic review tool, if applicable. No additional management support is needed unless otherwise documented below in the visit note. 

## 2015-07-13 NOTE — Addendum Note (Signed)
Addended by: [name] on: 07/13/2015 10:02 AM   Modules accepted: Miquel Dunn

## 2015-07-20 ENCOUNTER — Encounter: Payer: Self-pay | Admitting: Cardiovascular Disease

## 2015-07-20 ENCOUNTER — Ambulatory Visit (INDEPENDENT_AMBULATORY_CARE_PROVIDER_SITE_OTHER): Payer: Medicare PPO | Admitting: Cardiovascular Disease

## 2015-07-20 VITALS — BP 145/75 | HR 108 | Ht 64.0 in | Wt 163.5 lb

## 2015-07-20 DIAGNOSIS — Z8249 Family history of ischemic heart disease and other diseases of the circulatory system: Secondary | ICD-10-CM

## 2015-07-20 DIAGNOSIS — R079 Chest pain, unspecified: Secondary | ICD-10-CM

## 2015-07-20 DIAGNOSIS — E785 Hyperlipidemia, unspecified: Secondary | ICD-10-CM

## 2015-07-20 DIAGNOSIS — I6523 Occlusion and stenosis of bilateral carotid arteries: Secondary | ICD-10-CM

## 2015-07-20 DIAGNOSIS — M329 Systemic lupus erythematosus, unspecified: Secondary | ICD-10-CM

## 2015-07-20 DIAGNOSIS — E1159 Type 2 diabetes mellitus with other circulatory complications: Secondary | ICD-10-CM

## 2015-07-20 DIAGNOSIS — I10 Essential (primary) hypertension: Secondary | ICD-10-CM

## 2015-07-20 MED ORDER — ROSUVASTATIN CALCIUM 5 MG PO TABS
5.0000 mg | ORAL_TABLET | Freq: Every day | ORAL | Status: DC
Start: 2015-07-20 — End: 2016-10-20

## 2015-07-20 NOTE — Assessment & Plan Note (Signed)
Minimal bilateral atherosclerotic plaque Very good news considering long history of diabetes and high cholesterol Could repeat every several years Results discussed with her in detail

## 2015-07-20 NOTE — Assessment & Plan Note (Signed)
We have discussed her cholesterol with her at length, total cholesterol 270, LDL 190 She's willing to try low-dose statin, Crestor 5 mg every other day with slow titration upwards We will need to proceed cautiously as she currently has myalgias from lupus If unable to tolerate statin, other option would be a PCSK9 inhibitor This was discussed with her. She will research the medications

## 2015-07-20 NOTE — Assessment & Plan Note (Signed)
In hindsight she thinks the chest pain was secondary to stress CT scan reviewed with her showing no coronary calcifications No further episodes of chest pain since that time, normal EKG Recommend she call us for any episodes of chest pain. Stress test could be done for further symptoms

## 2015-07-20 NOTE — Assessment & Plan Note (Signed)
Initial blood pressure with systolic 161. 096 on recheck She does report blood pressure typically runs in the 115 up to 120s at home Recommended she continue to monitor this at home and call our office if it runs high

## 2015-07-20 NOTE — Assessment & Plan Note (Signed)
Despite her lupus, no dramatic atherosclerotic plaque seen on CT scan Mottled look to her toes but good extremity pulses

## 2015-07-20 NOTE — Assessment & Plan Note (Signed)
We have encouraged continued exercise, careful diet management in an effort to lose weight. 

## 2015-07-20 NOTE — Assessment & Plan Note (Signed)
Interestingly recent CT scan with no coronary calcifications noted Very very minimal descending aorta plaque. Very impressive given extent of diabetes and high cholesterol This places her at lower risk of heart attack Minimal carotid disease noted

## 2015-07-20 NOTE — Patient Instructions (Addendum)
You are doing well.  Please research praluent (every 14 days) and repatha   Please start crestor 5 mg every other day, slow titration upwards Repeat cholesterol in 3 months   Please call us if you have new issues that need to be addressed before your next appt.  Your physician wants you to follow-up in: 12 months.  You will receive a reminder letter in the mail two months in advance. If you don't receive a letter, please call our office to schedule the follow-up appointment.

## 2015-07-20 NOTE — Progress Notes (Signed)
Patient ID: Cassandra Benson, female    DOB: [birth year], 74 y.o.   MRN: [id number]  HPI Comments: Cassandra Benson is a very pleasant 74 year old woman with 15 years of diabetes, on insulin, hyperlipidemia, family history of coronary artery disease, mild bilateral carotid arterial disease on ultrasound September 2016, hypertension, who presents to establish care recent episode of chest pain.  She reports that several weeks ago she was sleeping, woke up with some chest discomfort. Went to the emergency room, cardiac enzymes negative 3, had a chest CT with no PE. Discharged home with follow-up. No further episodes of chest pain since that time. Typically is very active with no complaints. Recent bronchitis type symptoms, possibly viral. Slowly doing better, still with residual cough. She does report having elevated heart rate at baseline No regular exercise program Has an insulin pump, hemoglobin A1c 7.2  She is concerned about the episode of chest pain given her family history. Interestingly several relatives lived well into their 73s Patient is a nonsmoker,Mother was a smoker  Review of lab work with her shows total cholesterol 273 Carotid ultrasound with 40% or less bilateral carotid disease, minimal plaquing noted bilaterally  She does report prior cardiac catheterization in 2000 showing no significant disease CT scan done in the emergency room shows no coronary calcifications, minimal minimal descending aorta atherosclerosis  EKG on today's visit shows sinus tachycardia with rate 108 bpm, no significant ST or T-wave changes   Allergies  Allergen Reactions  . Iodinated Diagnostic Agents Other (See Comments)    Itching (severe) and chest tightness  . Codeine Nausea Only  . Influenza Vaccines     Muscle weakness  . Valsartan     Allergy to generic only Other reaction(s): Cough "Hacking" cough  . Erythromycin Rash and Swelling  . Penicillins Rash and Swelling  . Sulfa Antibiotics  Rash    Current Outpatient Prescriptions on File Prior to Visit  Medication Sig Dispense Refill  . aspirin EC 81 MG tablet Take 81 mg by mouth daily.    . BD ULTRA-FINE LANCETS lancets Use as instructed upto 6 times daily 600 each 2  . chlorpheniramine-HYDROcodone (TUSSIONEX PENNKINETIC ER) 10-8 MG/5ML SUER Take 5 mLs by mouth at bedtime as needed for cough. 140 mL 0  . Cholecalciferol 50000 UNITS TABS Take 25,000 mg by mouth every 14 (fourteen) days. Take one tablet by mouth every 14 days (2 weeks)    . Cyanocobalamin (B-12 SL) Place 1 tablet under the tongue daily.     Marland Kitchen ibuprofen (ADVIL,MOTRIN) 200 MG tablet Take 600 mg by mouth every 8 (eight) hours as needed.    . insulin aspart (NOVOLOG) 100 UNIT/ML injection Use in an insulin pump upto 50 units per day (Patient taking differently: Inject 23.5-27 Units into the skin. Use in an insulin pump upto 50 units per day) 2 vial 6  . Lecithin 400 MG CAPS Take 1 capsule by mouth daily.    Marland Kitchen LEVOXYL 75 MCG tablet TAKE 1 TABLET 6 DAYS A WEEK, AND 1.5 TABLETS BY MOUTH ONE DAY A WEEK. 32 tablet 3  . losartan (COZAAR) 50 MG tablet Take 1 tablet (50 mg total) by mouth daily. 30 tablet 3  . metFORMIN (GLUCOPHAGE-XR) 500 MG 24 hr tablet Take 1 tablet (500 mg total) by mouth 2 (two) times daily. 180 tablet 1   No current facility-administered medications on file prior to visit.    Past Medical History  Diagnosis Date  . Arthritis   . History  of chicken pox   . History of shingles   . Diabetes mellitus without complication ([place])   . History of diverticulitis   . Glaucoma     Narrow angle  . Allergy   . Hyperlipidemia   . Hypothyroidism   . History of UTI   . History of blood clots     DVT, in 20s, none since  . Lupus (systemic lupus erythematosus) ([place])   . History of pericarditis     with hospitalization  . History of pneumonia 2014  . Vitamin D deficiency     prior PCP  . Fibromyalgia     prior PCP  . Sleep apnea     prior PCP  . GERD  (gastroesophageal reflux disease)     prior PCP  . Shoulder pain left    h/o RTC tendonitis and adhesive capsulitis  . Carotid stenosis, asymptomatic 06/19/2015    6-29% RICA 47-65% LICA rpt 1 yr (12/6501)     Past Surgical History  Procedure Laterality Date  . Tonsillectomy and adenoidectomy    . Partial hip arthroplasty  2013    Right hip replacement  . Abdominal hysterectomy  1978    fibroids and menorrhagia, ovaries remain  . Vaginal delivery      x2, no complications  . Cardiac catheterization  [facility] normal per patient    Social History  reports that she has never smoked. She does not have any smokeless tobacco history on file. She reports that she does not drink alcohol or use illicit drugs.  Family History family history includes Alcohol abuse in her brother; CAD (age of onset: 39) in her brother; CAD (age of onset: 70) in her brother; CAD (age of onset: 64) in her father and mother; CAD (age of onset: 91) in her sister; COPD in her brother and mother; Cancer in her maternal aunt; Depression in her grandchild; Diabetes in her brother and sister; Lupus in her mother; Seizures in her son; Stroke in her brother and sister.   Review of Systems  Constitutional: Negative.   Respiratory: Positive for cough.   Cardiovascular: Positive for chest pain.  Gastrointestinal: Negative.   Musculoskeletal: Negative.   Neurological: Negative.   Hematological: Negative.   Psychiatric/Behavioral: Negative.   All other systems reviewed and are negative.   BP 145/75 mmHg  Pulse 108  Ht 5\' 4"  (1.626 m)  Wt 163 lb 8 oz (74.163 kg)  BMI 28.05 kg/m2   Physical Exam  Constitutional: She is oriented to person, place, and time. She appears well-developed and well-nourished.  HENT:  Head: Normocephalic.  Nose: Nose normal.  Mouth/Throat: Oropharynx is clear and moist.  Eyes: Conjunctivae are normal. Pupils are equal, round, and reactive to light.  Neck: Normal range of  motion. Neck supple. No JVD present.  Cardiovascular: Normal rate, regular rhythm, normal heart sounds and intact distal pulses.  Exam reveals no gallop and no friction rub.   No murmur heard. Pulmonary/Chest: Effort normal and breath sounds normal. No respiratory distress. She has no wheezes. She has no rales. She exhibits no tenderness.  Abdominal: Soft. Bowel sounds are normal. She exhibits no distension. There is no tenderness.  Musculoskeletal: Normal range of motion. She exhibits no edema or tenderness.  Lymphadenopathy:    She has no cervical adenopathy.  Neurological: She is alert and oriented to person, place, and time. Coordination normal.  Skin: Skin is warm and dry. No rash noted. No erythema.  Psychiatric: She  has a normal mood and affect. Her behavior is normal. Judgment and thought content normal.

## 2015-09-14 ENCOUNTER — Ambulatory Visit: Payer: Medicare PPO | Admitting: Family Medicine

## 2015-10-02 ENCOUNTER — Ambulatory Visit (INDEPENDENT_AMBULATORY_CARE_PROVIDER_SITE_OTHER): Payer: Medicare PPO | Admitting: Family Medicine

## 2015-10-02 ENCOUNTER — Encounter: Payer: Self-pay | Admitting: Family Medicine

## 2015-10-02 VITALS — BP 126/60 | HR 80 | Temp 97.5°F | Wt 163.5 lb

## 2015-10-02 DIAGNOSIS — I10 Essential (primary) hypertension: Secondary | ICD-10-CM | POA: Diagnosis not present

## 2015-10-02 DIAGNOSIS — E1159 Type 2 diabetes mellitus with other circulatory complications: Secondary | ICD-10-CM | POA: Diagnosis not present

## 2015-10-02 DIAGNOSIS — E785 Hyperlipidemia, unspecified: Secondary | ICD-10-CM

## 2015-10-02 DIAGNOSIS — Z794 Long term (current) use of insulin: Secondary | ICD-10-CM

## 2015-10-02 DIAGNOSIS — E039 Hypothyroidism, unspecified: Secondary | ICD-10-CM

## 2015-10-02 DIAGNOSIS — M329 Systemic lupus erythematosus, unspecified: Secondary | ICD-10-CM

## 2015-10-02 NOTE — Assessment & Plan Note (Signed)
Chronic, stable. Slight deterioration noted which pt attributes to bad bottle of insulin. Discussed upcoming pump change.  Pt will f/u with endo at [facility] 01/2016, sooner locally if desired.

## 2015-10-02 NOTE — Assessment & Plan Note (Signed)
Reviewed crestor use. Advised QOD dosing, but actually only taking twice weekly. Feels worsening body aches when she takes statin pill. Strong fmhx intolerance. Hesitant for any chol med in general. Again discussed PCSK9 inhibitor and encouraged she continue to consider this option. Check FLP next labwork/ofice visit, will continue discussion then and with cardiologist.

## 2015-10-02 NOTE — Progress Notes (Signed)
Pre visit review using our clinic review tool, if applicable. No additional management support is needed unless otherwise documented below in the visit note. 

## 2015-10-02 NOTE — Assessment & Plan Note (Signed)
Chronic, stable. Continue current regimen. 

## 2015-10-02 NOTE — Patient Instructions (Addendum)
Nice to see you today, call us with questions. Continue current medicines. Return in 4 months for follow up. Fasting labs next time.

## 2015-10-02 NOTE — Assessment & Plan Note (Addendum)
Hyperesthesia present. Kidneys stable.

## 2015-10-02 NOTE — Assessment & Plan Note (Signed)
Chronic, stable. Continue levoxyl 75mcg daily. 

## 2015-10-02 NOTE — Progress Notes (Signed)
BP 126/60 mmHg  Pulse 80  Temp(Src) 97.5 F (36.4 C) (Oral)  Wt 163 lb 8 oz (74.163 kg)   CC: 19mo f/u visit  Subjective:    Patient ID: Cassandra Benson, female    DOB: Sep 06, 1941, 75 y.o.   MRN: [id number]  HPI: Cassandra Benson is a 75 y.o. female presenting on 10/02/2015 for Follow-up   Husband has not been well. Pt has not had chance to see endocrinologist. Thinks she may stay at [facility].   HLD - intolerant of statins in past. Last visit we recommended RYR 600mg  bid. Discussed chol with Dr Rockey Situ (I wanted her to discuss PCSK9 inhibitors), has started on low dose Crestor 5mg  QOD. Actually taking 5mg  twice weekly.   HTN - Compliant with current antihypertensive regimen of losartan 50mg  daily.  Does check blood pressures at home: same as today. No low blood pressure readings or symptoms of dizziness/syncope. Denies HA, vision changes, CP/tightness, SOB, leg swelling.   DM - regularly does check sugars, elevated recently attributes to bad bottle of insulin. 200s. Compliant with antihyperglycemic regimen which includes: metformin 500mg  XR BID and novolog pump. Denies low sugars or hypoglycemic symptoms.  Denies paresthesias. Last diabetic eye exam DUE - has appt next month. Pneumovax: 2014. Prevnar: 2015. Soon will need new insulin pump - she has to pay for this out of pocket because she has medicare.  Lab Results  Component Value Date   HGBA1C 7.2* 05/26/2015   Diabetic Foot Exam - Simple   Simple Foot Form  Diabetic Foot exam was performed with the following findings:  Yes 10/02/2015 12:55 PM  Visual Inspection  See comments:  Yes  Sensation Testing  Intact to touch and monofilament testing bilaterally:  Yes  Pulse Check  Posterior Tibialis and Dorsalis pulse intact bilaterally:  Yes  Comments  Some erythema of toes       Lab Results  Component Value Date   TSH 2.90 06/08/2015    Relevant past medical, surgical, family and social history reviewed and updated as indicated.  Interim medical history since our last visit reviewed. Allergies and medications reviewed and updated. Current Outpatient Prescriptions on File Prior to Visit  Medication Sig  . aspirin EC 81 MG tablet Take 81 mg by mouth daily.  . Cholecalciferol 50000 UNITS TABS Take 25,000 mg by mouth every 14 (fourteen) days. Take one tablet by mouth every 14 days (2 weeks)  . Cyanocobalamin (B-12 SL) Place 1 tablet under the tongue daily.   Marland Kitchen ibuprofen (ADVIL,MOTRIN) 200 MG tablet Take 600 mg by mouth every 8 (eight) hours as needed.  . insulin aspart (NOVOLOG) 100 UNIT/ML injection Use in an insulin pump upto 50 units per day (Patient taking differently: Inject 23.5-27 Units into the skin. Use in an insulin pump upto 50 units per day)  . Lecithin 400 MG CAPS Take 1 capsule by mouth daily.  Marland Kitchen LEVOXYL 75 MCG tablet TAKE 1 TABLET 6 DAYS A WEEK, AND 1.5 TABLETS BY MOUTH ONE DAY A WEEK.  Marland Kitchen losartan (COZAAR) 50 MG tablet Take 1 tablet (50 mg total) by mouth daily.  . metFORMIN (GLUCOPHAGE-XR) 500 MG 24 hr tablet Take 1 tablet (500 mg total) by mouth 2 (two) times daily. (Patient taking differently: Take 500 mg by mouth 2 (two) times daily. Twice a day every other day due to diarrhea)  . rosuvastatin (CRESTOR) 5 MG tablet Take 1 tablet (5 mg total) by mouth daily. (Patient taking differently: Take 5 mg by  mouth 2 (two) times a week. )  . BD ULTRA-FINE LANCETS lancets Use as instructed upto 6 times daily  . chlorpheniramine-HYDROcodone (TUSSIONEX PENNKINETIC ER) 10-8 MG/5ML SUER Take 5 mLs by mouth at bedtime as needed for cough.   No current facility-administered medications on file prior to visit.    Review of Systems Per HPI unless specifically indicated in ROS section     Objective:    BP 126/60 mmHg  Pulse 80  Temp(Src) 97.5 F (36.4 C) (Oral)  Wt 163 lb 8 oz (74.163 kg)  Wt Readings from Last 3 Encounters:  10/02/15 163 lb 8 oz (74.163 kg)  07/20/15 163 lb 8 oz (74.163 kg)  07/13/15 162 lb 8 oz  (73.71 kg)    Physical Exam  Constitutional: She appears well-developed and well-nourished. No distress.  HENT:  Head: Normocephalic and atraumatic.  Right Ear: External ear normal.  Left Ear: External ear normal.  Nose: Nose normal.  Mouth/Throat: Oropharynx is clear and moist. No oropharyngeal exudate.  Eyes: Conjunctivae and EOM are normal. Pupils are equal, round, and reactive to light. No scleral icterus.  Neck: Normal range of motion. Neck supple.  Cardiovascular: Normal rate, regular rhythm, normal heart sounds and intact distal pulses.   No murmur heard. Pulmonary/Chest: Effort normal and breath sounds normal. No respiratory distress. She has no wheezes. She has no rales.  Musculoskeletal: She exhibits no edema.  See HPI for foot exam if done  Lymphadenopathy:    She has no cervical adenopathy.  Skin: Skin is warm and dry. No rash noted.  Psychiatric: She has a normal mood and affect.  Nursing note and vitals reviewed.     Assessment & Plan:   Problem List Items Addressed This Visit    SLE (systemic lupus erythematosus) ([place])    Hyperesthesia present. Kidneys stable.       Hypothyroidism    Chronic, stable. Continue levoxyl 57mcg daily.      Hyperlipidemia    Reviewed crestor use. Advised QOD dosing, but actually only taking twice weekly. Feels worsening body aches when she takes statin pill. Strong fmhx intolerance. Hesitant for any chol med in general. Again discussed PCSK9 inhibitor and encouraged she continue to consider this option. Check FLP next labwork/ofice visit, will continue discussion then and with cardiologist.      Essential hypertension    Chronic, stable. Continue current regimen.       Diabetes type 2, controlled ([place]) - Primary    Chronic, stable. Slight deterioration noted which pt attributes to bad bottle of insulin. Discussed upcoming pump change.  Pt will f/u with endo at [facility] 01/2016, sooner locally if desired.          Follow up  plan: Return in about 4 months (around 01/30/2016), or as needed, for follow up visit.

## 2015-11-04 ENCOUNTER — Ambulatory Visit: Payer: Medicare PPO | Admitting: Family Medicine

## 2015-11-04 LAB — HM DIABETES EYE EXAM

## 2015-11-05 ENCOUNTER — Ambulatory Visit (INDEPENDENT_AMBULATORY_CARE_PROVIDER_SITE_OTHER): Payer: Medicare PPO | Admitting: Family Medicine

## 2015-11-05 ENCOUNTER — Encounter: Payer: Self-pay | Admitting: Family Medicine

## 2015-11-05 ENCOUNTER — Telehealth: Payer: Self-pay | Admitting: Family Medicine

## 2015-11-05 VITALS — BP 142/70 | HR 90 | Temp 98.4°F | Wt 162.5 lb

## 2015-11-05 DIAGNOSIS — M25511 Pain in right shoulder: Secondary | ICD-10-CM

## 2015-11-05 DIAGNOSIS — M79629 Pain in unspecified upper arm: Secondary | ICD-10-CM | POA: Insufficient documentation

## 2015-11-05 DIAGNOSIS — J209 Acute bronchitis, unspecified: Secondary | ICD-10-CM

## 2015-11-05 DIAGNOSIS — M79621 Pain in right upper arm: Secondary | ICD-10-CM

## 2015-11-05 MED ORDER — HYDROCOD POLST-CPM POLST ER 10-8 MG/5ML PO SUER: 5.0000 mL | Freq: Every evening | ORAL | Status: DC | PRN

## 2015-11-05 MED ORDER — AZITHROMYCIN 250 MG PO TABS: ORAL_TABLET | ORAL | Status: DC

## 2015-11-05 NOTE — Patient Instructions (Addendum)
Double check on date of mammogram. If you haven't had one 2016, call us and let us know.  I do think you have developed bronchitis - take azithromycin course sent to pharmacy. Take tussionex cough syrup for night time. Push fluids and rest. Use plain mucinex with plenty of water to help mobilize mucous. Watch for fever >101, worsening productive cough despite treatment or just not improving as expected.

## 2015-11-05 NOTE — Assessment & Plan Note (Signed)
Mild discomfort on right. Pt will check on latest mammo - thinks had normal 2016. If not will order dx R mammo and screening L.

## 2015-11-05 NOTE — Telephone Encounter (Signed)
Pt called back and states that she does need a diagnostic breast exam She can do anytime the week of feb 20, but not the 21st  Mid morning preferred She goes to [place]  cb number is [phone]

## 2015-11-05 NOTE — Assessment & Plan Note (Signed)
Given duration and progression of sxs, cover for bacterial/atypical etiology with azithromycin. tussionex for night time cough. Update if persistent sxs. Pt agrees with plan. R abd wall soreness anticipate strain from cough.

## 2015-11-05 NOTE — Progress Notes (Signed)
Pre visit review using our clinic review tool, if applicable. No additional management support is needed unless otherwise documented below in the visit note. 
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
## 2015-11-05 NOTE — Telephone Encounter (Signed)
Ordered. Thanks

## 2015-11-06 ENCOUNTER — Telehealth: Payer: Self-pay | Admitting: Family Medicine

## 2015-11-06 NOTE — Telephone Encounter (Signed)
Dr Beatriz Chancellor called [facility] to make appointment They stated pt was there 2015 for same problem and was cleared for screening mammogram if you want screening mammogram order needs to be changed to screening bilateral mammogram  They wanted to know if pt was having more problems with same area and you wanted diagnostic mammogram and ultra sound. If so the order needs to say diagnostic bilateral mammogram with uni left and right ultrasound  thanks

## 2015-11-06 NOTE — Telephone Encounter (Signed)
orders updated.

## 2015-11-06 NOTE — Telephone Encounter (Signed)
having new R axillary discomfort - will do diagnostic mammo/US

## 2015-11-06 NOTE — Telephone Encounter (Signed)
Please order a Bilateral Diagnostic MMG and then a Uni right limited US, and Uni left limited US, the patient is overdue for her mammogram so they will want to do Bilateral. They told us they will never do a complete breast US , that they need to know the clock position or quadrant where the problem is. One of your orders is a complete and the other says screening. Sorry, please change.

## 2015-11-06 NOTE — Addendum Note (Signed)
Addended by: [name] on: 11/06/2015 04:41 PM   Modules accepted: Orders

## 2015-11-20 ENCOUNTER — Other Ambulatory Visit: Payer: Self-pay | Admitting: Family Medicine

## 2015-11-20 ENCOUNTER — Ambulatory Visit
Admission: RE | Admit: 2015-11-20 | Discharge: 2015-11-20 | Disposition: A | Discharge status: Home / Self Care | Payer: Medicare PPO | Source: Ambulatory Visit | Attending: Family Medicine | Admitting: Family Medicine

## 2015-11-20 DIAGNOSIS — M79621 Pain in right upper arm: Secondary | ICD-10-CM

## 2015-11-20 DIAGNOSIS — M25511 Pain in right shoulder: Secondary | ICD-10-CM | POA: Diagnosis present

## 2015-11-20 LAB — HM MAMMOGRAPHY: HM MAMMO: NORMAL

## 2015-11-24 ENCOUNTER — Encounter: Payer: Self-pay | Admitting: *Deleted

## 2015-12-10 ENCOUNTER — Telehealth: Payer: Self-pay | Admitting: *Deleted

## 2015-12-10 NOTE — Telephone Encounter (Signed)
Received notification that non-formulary override was required for glucophage xr. Submitted through cover my meds and it was returned saying it was required. Resubmitted for quantity override and it wasn't required either.

## 2015-12-29 ENCOUNTER — Other Ambulatory Visit: Payer: Self-pay | Admitting: Family Medicine

## 2015-12-29 NOTE — Telephone Encounter (Signed)
Printed and in Kim's box 

## 2015-12-29 NOTE — Telephone Encounter (Signed)
Ok to refill 
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
## 2016-01-11 LAB — COMPREHENSIVE METABOLIC PANEL
ALBUMIN: 4.1
ALT: 21
AST: 24 U/L
Alkaline Phosphatase: 74 U/L
BILIRUBIN TOTAL: 0.3 mg/dL
CREATININE: 0.89
Glucose: 91
Potassium: 4.2 mmol/L
SODIUM: 141

## 2016-01-11 LAB — LIPID PANEL
CHOLESTEROL: 236
HDL: 41
LDL (calc): 160
TRIGLYCERIDES: 175

## 2016-01-11 LAB — VITAMIN B12: VITAMIN B12: 1332

## 2016-01-11 LAB — TSH+FREE T4
FREE T4: 1.32
TSH: 1.06

## 2016-01-11 LAB — HEMOGLOBIN A1C: A1C: 7.3

## 2016-01-11 LAB — MICROALBUMIN / CREATININE URINE RATIO

## 2016-01-27 ENCOUNTER — Other Ambulatory Visit: Payer: Medicare PPO

## 2016-01-27 ENCOUNTER — Other Ambulatory Visit: Payer: Self-pay | Admitting: Family Medicine

## 2016-01-27 DIAGNOSIS — E039 Hypothyroidism, unspecified: Secondary | ICD-10-CM

## 2016-01-27 DIAGNOSIS — E1159 Type 2 diabetes mellitus with other circulatory complications: Secondary | ICD-10-CM

## 2016-01-27 DIAGNOSIS — Z794 Long term (current) use of insulin: Principal | ICD-10-CM

## 2016-01-27 DIAGNOSIS — E559 Vitamin D deficiency, unspecified: Secondary | ICD-10-CM

## 2016-01-27 DIAGNOSIS — E785 Hyperlipidemia, unspecified: Secondary | ICD-10-CM

## 2016-01-27 DIAGNOSIS — I10 Essential (primary) hypertension: Secondary | ICD-10-CM

## 2016-02-02 ENCOUNTER — Ambulatory Visit (INDEPENDENT_AMBULATORY_CARE_PROVIDER_SITE_OTHER)
Admission: RE | Admit: 2016-02-02 | Discharge: 2016-02-02 | Disposition: A | Discharge status: Home / Self Care | Payer: Medicare PPO | Source: Ambulatory Visit | Attending: Family Medicine | Admitting: Family Medicine

## 2016-02-02 ENCOUNTER — Ambulatory Visit (INDEPENDENT_AMBULATORY_CARE_PROVIDER_SITE_OTHER): Payer: Medicare PPO | Admitting: Family Medicine

## 2016-02-02 ENCOUNTER — Encounter: Payer: Self-pay | Admitting: Family Medicine

## 2016-02-02 VITALS — BP 114/66 | HR 80 | Temp 97.7°F | Wt 158.5 lb

## 2016-02-02 DIAGNOSIS — I10 Essential (primary) hypertension: Secondary | ICD-10-CM

## 2016-02-02 DIAGNOSIS — E1159 Type 2 diabetes mellitus with other circulatory complications: Secondary | ICD-10-CM | POA: Diagnosis not present

## 2016-02-02 DIAGNOSIS — Z7189 Other specified counseling: Secondary | ICD-10-CM | POA: Insufficient documentation

## 2016-02-02 DIAGNOSIS — S6991XA Unspecified injury of right wrist, hand and finger(s), initial encounter: Secondary | ICD-10-CM | POA: Diagnosis not present

## 2016-02-02 DIAGNOSIS — Z794 Long term (current) use of insulin: Secondary | ICD-10-CM

## 2016-02-02 DIAGNOSIS — E785 Hyperlipidemia, unspecified: Secondary | ICD-10-CM

## 2016-02-02 DIAGNOSIS — Z636 Dependent relative needing care at home: Secondary | ICD-10-CM

## 2016-02-02 DIAGNOSIS — S6990XA Unspecified injury of unspecified wrist, hand and finger(s), initial encounter: Secondary | ICD-10-CM | POA: Insufficient documentation

## 2016-02-02 MED ORDER — METFORMIN HCL ER 500 MG PO TB24: 500.0000 mg | ORAL_TABLET | Freq: Three times a day (TID) | ORAL | Status: DC

## 2016-02-02 NOTE — Assessment & Plan Note (Signed)
Discussed, support provided.

## 2016-02-02 NOTE — Assessment & Plan Note (Signed)
Triggering on exam. Will check xray to eval for scaphoid injury vs CMC arthritis. If normal, consider referral to hand for trigger finger.

## 2016-02-02 NOTE — Assessment & Plan Note (Signed)
Chronic, stable. A1c 7.3% last check - will ask to update chart. Continue current regimen. Appreciate [facility] endo care.

## 2016-02-02 NOTE — Progress Notes (Signed)
BP 114/66 mmHg  Pulse 80  Temp(Src) 97.7 F (36.5 C) (Oral)  Wt 158 lb 8 oz (71.895 kg)   CC: f/u visit  Subjective:    Patient ID: Cassandra Benson, female    DOB: 08/10/1941, 75 y.o.   MRN: [id number]  HPI: Cassandra Benson is a 75 y.o. female presenting on 02/02/2016 for Follow-up   Continues caring for husband with stage 4 colon cancer. Support provided. She has supportive family and church family.   Brings labs from 12/2015 from [facility] - B12 high, TFTs normal, FLP elevated, Cr and LFTs normal. A1c 7.3%. Will scan into system.   Thumb staying sore over last 2 months, may have started after horseplay with grandchild.   DM - followed by [facility] endo. Regularly does check sugars, averaging ok. Compliant with antihyperglycemic regimen which includes: metformin 500mg  XR BID and novolog pump. Last diabetic eye exam 10/2015. Pneumovax: 2014. Prevnar: 2015. Soon will need new insulin pump - she has to pay for this out of pocket because she has medicare.  Lab Results  Component Value Date   HGBA1C 7.2* 05/26/2015     HLD - intolerant of statins in past. Last visit we recommended RYR 600mg  bid. Unable to tolerate low dose crestor (twice weekly). Considering PCSK9 inhibitors. Attributes to strawberry shortcake with family visiting.  HTN - Compliant with current antihypertensive regimen of losartan 50mg  daily. Does check blood pressures at home: same as today. No low blood pressure readings or symptoms of dizziness/syncope. Denies HA, vision changes, CP/tightness, SOB, leg swelling.   Relevant past medical, surgical, family and social history reviewed and updated as indicated. Interim medical history since our last visit reviewed. Allergies and medications reviewed and updated. Current Outpatient Prescriptions on File Prior to Visit  Medication Sig  . aspirin EC 81 MG tablet Take 81 mg by mouth daily.  . Cholecalciferol 50000 UNITS TABS Take 25,000 mg by mouth every 14 (fourteen) days. Take  one tablet by mouth every 14 days (2 weeks)  . Cyanocobalamin (B-12 SL) Place 1 tablet under the tongue daily.   Marland Kitchen ibuprofen (ADVIL,MOTRIN) 200 MG tablet Take 600 mg by mouth every 8 (eight) hours as needed.  . insulin aspart (NOVOLOG) 100 UNIT/ML injection Use in an insulin pump upto 50 units per day (Patient taking differently: Inject 23.5-27 Units into the skin. Use in an insulin pump upto 50 units per day)  . Lecithin 400 MG CAPS Take 1 capsule by mouth daily.  Marland Kitchen LEVOXYL 75 MCG tablet TAKE 1 TABLET 6 DAYS A WEEK, AND 1.5 TABLETS BY MOUTH ONE DAY A WEEK.  Marland Kitchen losartan (COZAAR) 50 MG tablet Take 1 tablet (50 mg total) by mouth daily.  . rosuvastatin (CRESTOR) 5 MG tablet Take 1 tablet (5 mg total) by mouth daily. (Patient taking differently: Take 5 mg by mouth 2 (two) times a week. )  . BD ULTRA-FINE LANCETS lancets Use as instructed upto 6 times daily   No current facility-administered medications on file prior to visit.    Review of Systems Per HPI unless specifically indicated in ROS section     Objective:    BP 114/66 mmHg  Pulse 80  Temp(Src) 97.7 F (36.5 C) (Oral)  Wt 158 lb 8 oz (71.895 kg)  Wt Readings from Last 3 Encounters:  02/02/16 158 lb 8 oz (71.895 kg)  11/05/15 162 lb 8 oz (73.71 kg)  10/02/15 163 lb 8 oz (74.163 kg)    Physical Exam  Constitutional: She  appears well-developed and well-nourished. No distress.  HENT:  Mouth/Throat: Oropharynx is clear and moist. No oropharyngeal exudate.  Cardiovascular: Normal rate, regular rhythm, normal heart sounds and intact distal pulses.   No murmur heard. Pulmonary/Chest: Effort normal and breath sounds normal. No respiratory distress. She has no wheezes. She has no rales.  Musculoskeletal: She exhibits no edema.  L thumb WNL R thumb tender to palpation throughout even at anatomical snuffbox. No UCL laxity. Triggering of thumb present.   Nursing note and vitals reviewed.  Results for orders placed or performed in visit  on 11/24/15  [facility] MAMMOGRAPHY  Result Value Ref Range   [facility] Mammogram Normal Bir[place] 1-Repeat 1 year       Assessment & Plan:  Over 25 minutes were spent face-to-face with the patient during this encounter and >50% of that time was spent on counseling and coordination of care  Problem List Items Addressed This Visit    Diabetes type 2, controlled ([place])    Chronic, stable. A1c 7.3% last check - will ask to update chart. Continue current regimen. Appreciate [facility] endo care.      Relevant Medications   metFORMIN (GLUCOPHAGE-XR) 500 MG 24 hr tablet   Essential hypertension    Chronic, stable. Continue current regimen.      Hyperlipidemia    Reviewed options with patient. Intolerant to statins (even twice weekly crestor dosing). Pt will continue to consider PCSK9 inhibitor and call us or cards if desires referral to pharmacy clinic.       Thumb injury - Primary    Triggering on exam. Will check xray to eval for scaphoid injury vs CMC arthritis. If normal, consider referral to hand for trigger finger.       Relevant Orders   DG Finger Thumb Right   Caregiver stress    Discussed, support provided.           Follow up plan: Return in about 4 months (around 06/04/2016), or as needed, for follow up visit.  [name], MD

## 2016-02-02 NOTE — Assessment & Plan Note (Signed)
Reviewed options with patient. Intolerant to statins (even twice weekly crestor dosing). Pt will continue to consider PCSK9 inhibitor and call us or cards if desires referral to pharmacy clinic.

## 2016-02-02 NOTE — Patient Instructions (Addendum)
Xray or right thumb today.  Think about injectable cholesterol medicine - if you'd like to further investigate this, let me or Dr Rockey Situ know and we will refer you to pharmacy clinic to further discuss. Good to see you today, call us with questions.  Return as needed or in 3-4 months for follow up.

## 2016-02-02 NOTE — Progress Notes (Signed)
Pre visit review using our clinic review tool, if applicable. No additional management support is needed unless otherwise documented below in the visit note. 

## 2016-02-02 NOTE — Assessment & Plan Note (Signed)
Chronic, stable. Continue current regimen. 

## 2016-02-04 ENCOUNTER — Encounter: Payer: Self-pay | Admitting: *Deleted

## 2016-02-10 ENCOUNTER — Encounter: Payer: Self-pay | Admitting: Family Medicine

## 2016-02-26 ENCOUNTER — Telehealth: Payer: Self-pay | Admitting: *Deleted

## 2016-02-26 NOTE — Telephone Encounter (Signed)
Glucophage XR requires PA. Completed and approved via Cover My Meds.

## 2016-03-17 ENCOUNTER — Encounter: Payer: Self-pay | Admitting: Internal Medicine

## 2016-03-17 ENCOUNTER — Ambulatory Visit (INDEPENDENT_AMBULATORY_CARE_PROVIDER_SITE_OTHER): Payer: Medicare PPO | Admitting: Internal Medicine

## 2016-03-17 VITALS — BP 124/76 | HR 77 | Temp 97.8°F | Wt 153.8 lb

## 2016-03-17 DIAGNOSIS — M329 Systemic lupus erythematosus, unspecified: Secondary | ICD-10-CM

## 2016-03-17 DIAGNOSIS — Z634 Disappearance and death of family member: Secondary | ICD-10-CM

## 2016-03-17 DIAGNOSIS — F4321 Adjustment disorder with depressed mood: Secondary | ICD-10-CM

## 2016-03-17 MED ORDER — IBUPROFEN 600 MG PO TABS: 600.0000 mg | ORAL_TABLET | Freq: Three times a day (TID) | ORAL | Status: DC | PRN

## 2016-03-17 NOTE — Progress Notes (Signed)
Subjective:    Patient ID: Cassandra Benson, female    DOB: 1941/05/18, 75 y.o.   MRN: [id number]  HPI  Pt presents to the clinic today with c/o grief and anxiety. She was the primary caregiver of her husband, who recently died from colon cancer. She is not sleeping well, only getting about 6 hours of sleep at night. She feels tired and fatigued throughout the day. Her appetite is okay, she denies weight loss (although in EMR, she has lost 5 lbs over the last month). She is struggling being by herself at home. She does have a good support system that includes her son and friends from church. She is working with hospice on grief counseling. She has never been treated for anxiety and depression in the past.  She wants a refil of her Ibuprofen today. She takes this as needed for lupus flares.  Review of Systems      Past Medical History  Diagnosis Date  . Arthritis   . History of chicken pox   . History of shingles   . Diabetes mellitus without complication ([place])   . History of diverticulitis   . Glaucoma     Narrow angle  . Allergy   . Hyperlipidemia   . Hypothyroidism   . History of UTI   . History of blood clots     DVT, in 20s, none since  . Lupus (systemic lupus erythematosus) ([place])   . History of pericarditis     with hospitalization  . History of pneumonia 2014  . Vitamin D deficiency     prior PCP  . Fibromyalgia     prior PCP  . Sleep apnea     prior PCP  . GERD (gastroesophageal reflux disease)     prior PCP  . Shoulder pain left    h/o RTC tendonitis and adhesive capsulitis  . Carotid stenosis, asymptomatic 06/19/2015    123456 RICA 123456 LICA rpt 1 yr ([id number])     Current Outpatient Prescriptions  Medication Sig Dispense Refill  . aspirin EC 81 MG tablet Take 81 mg by mouth daily.    . BD ULTRA-FINE LANCETS lancets Use as instructed upto 6 times daily 600 each 2  . Cholecalciferol 50000 UNITS TABS Take 25,000 mg by mouth every 14 (fourteen) days. Take one  tablet by mouth every 14 days (2 weeks)    . Cyanocobalamin (B-12 SL) Place 1 tablet under the tongue daily.     Marland Kitchen ibuprofen (ADVIL,MOTRIN) 200 MG tablet Take 600 mg by mouth every 8 (eight) hours as needed.    . insulin aspart (NOVOLOG) 100 UNIT/ML injection Use in an insulin pump upto 50 units per day (Patient taking differently: Inject 23.5-27 Units into the skin. Use in an insulin pump upto 50 units per day) 2 vial 6  . Lecithin 400 MG CAPS Take 1 capsule by mouth daily.    Marland Kitchen LEVOXYL 75 MCG tablet TAKE 1 TABLET 6 DAYS A WEEK, AND 1.5 TABLETS BY MOUTH ONE DAY A WEEK. 32 tablet 3  . losartan (COZAAR) 50 MG tablet Take 1 tablet (50 mg total) by mouth daily. 30 tablet 3  . metFORMIN (GLUCOPHAGE-XR) 500 MG 24 hr tablet Take 1 tablet (500 mg total) by mouth 3 (three) times daily. 270 tablet 1  . rosuvastatin (CRESTOR) 5 MG tablet Take 1 tablet (5 mg total) by mouth daily. (Patient taking differently: Take 5 mg by mouth 2 (two) times a week. ) 30 tablet 11  No current facility-administered medications for this visit.    Allergies  Allergen Reactions  . Iodinated Diagnostic Agents Other (See Comments)    Itching (severe) and chest tightness  . Codeine Nausea Only  . Influenza Vaccines     Muscle weakness  . Valsartan     Allergy to generic only Other reaction(s): Cough "Hacking" cough  . Erythromycin Rash and Swelling  . Penicillins Rash and Swelling  . Sulfa Antibiotics Rash    Family History  Problem Relation Age of Onset  . CAD Mother 18    MI, aortic valve issues  . COPD Mother   . Lupus Mother   . CAD Father 19    CABG x2, aortic valve replacement  . Diabetes Sister     deceased  . Stroke Sister   . Alcohol abuse Brother   . CAD Brother 2    MI  . Stroke Brother   . Seizures Son   . COPD Brother   . CAD Brother 70    stent  . Diabetes Brother   . Depression Grandchild   . Cancer Maternal Aunt     breast  . Breast cancer Maternal Aunt   . CAD Sister 99     stents  . Breast cancer Maternal Aunt     Social History   Social History  . Marital Status: Married    Spouse Name: N/A  . Number of Children: N/A  . Years of Education: N/A   Occupational History  . Not on file.   Social History Main Topics  . Smoking status: Never Smoker   . Smokeless tobacco: Not on file  . Alcohol Use: No  . Drug Use: No  . Sexual Activity: Not on file   Other Topics Concern  . Not on file   Social History Narrative   Lives in [place] now. Recently moved from [place]. Lives with husband.   No pets.   Mother of Hiilei Ned.   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 Denies fever, malaise, headache or abrupt weight changes.  Respiratory: Denies difficulty breathing, shortness of breath, cough or sputum production.   Cardiovascular: Denies chest pain, chest tightness, palpitations or swelling in the hands or feet.  Neurological: Denies dizziness, difficulty with memory, difficulty with speech or problems with balance and coordination.  Psych: Pt reports grief and anxiety. Denies depression, SI/HI.  No other specific complaints in a complete review of systems (except as listed in HPI above).  Objective:   Physical Exam   BP 124/76 mmHg  Pulse 77  Temp(Src) 97.8 F (36.6 C) (Oral)  Wt 153 lb 12 oz (69.741 kg)  SpO2 98% Wt Readings from Last 3 Encounters:  03/17/16 153 lb 12 oz (69.741 kg)  02/02/16 158 lb 8 oz (71.895 kg)  11/05/15 162 lb 8 oz (73.71 kg)    General: Appears her stated age, well developed, well nourished in NAD. Neurological: Alert and oriented.  Psychiatric: Mood and affect slightly flat. She does engage and make eye contact.    BMET    Component Value Date/Time   NA 141  01/11/2016   NA 140 05/26/2015 0304   K 4.2 01/11/2016   K 3.8 05/26/2015 0304   CL  107 05/26/2015 0304   CO2 26 05/26/2015 0304   GLUCOSE 134* 05/26/2015 0304   BUN 9 05/26/2015 0304   CREATININE 0.89 01/11/2016   CREATININE 0.88 05/26/2015 0304   CALCIUM 9.6 05/26/2015 0304   GFRNONAA >60 05/26/2015 0304   GFRAA >60 05/26/2015 0304    Lipid Panel     Component Value Date/Time   CHOL 236 01/11/2016   CHOL 273* 05/27/2015 0707   TRIG 175 01/11/2016   TRIG 132 05/27/2015 0707   HDL 53 05/27/2015 0707   HDL 45 01/29/2015   CHOLHDL 5.2 05/27/2015 0707   VLDL 26 05/27/2015 0707   LDLCALC 160 01/11/2016   LDLCALC 194* 05/27/2015 0707    CBC    Component Value Date/Time   WBC 10.9 05/26/2015 0304   RBC 5.27* 05/26/2015 0304   HGB 13.6 05/26/2015 0304   HCT 42.3 05/26/2015 0304   PLT 229 05/26/2015 0304   MCV 80.2 05/26/2015 0304   MCH 25.9* 05/26/2015 0304   MCHC 32.2 05/26/2015 0304   RDW 14.4 05/26/2015 0304    Hgb A1C Lab Results  Component Value Date   HGBA1C 7.2* 05/26/2015        Assessment & Plan:   Grief due to death of husband:  Support offered today Discussed treatment options with different medications including SSRI and benzodiazepene's She is not interested in medication therapy at this time She will continue grief counseling through hospice  SLE:  Ibuprofen refilled today  Follow up with PCP as previously scheduled Webb Silversmith, NP

## 2016-03-17 NOTE — Patient Instructions (Signed)
Complicated Grieving Grief is a normal response to the death of someone close to you. Feelings of fear, anger, and guilt can affect almost everyone who loses a loved one. It is also common to have symptoms of depression while you are grieving. These include problems with sleep, loss of appetite, and lack of energy. They may last for weeks or months after a loss. Complicated grief is different from normal grief or depression. Normal grieving involves sadness and feelings of loss, but these feelings are not constant. Complicated grief is a constant and severe type of grief. It interferes with your ability to function normally. It may last for several months to a year or longer. Complicated grief may require treatment from a mental health care provider. CAUSES  It is not known why some people continue to struggle with grief and others do not. You may be at higher risk for complicated grief if:  The death of your loved one was sudden or unexpected.  The death of your loved one was due to a violent event.  Your loved one committed suicide.  Your loved one was a child or a young person.  You were very close to or dependent on the loved one.  You have a history of depression. SIGNS AND SYMPTOMS Signs and symptoms of complicated grief may include:  Feeling disbelief or numbness.  Being unable to enjoy good memories of your loved one.  Needing to avoid anything that reminds you of your loved one.  Being unable to stop thinking about the death.  Feeling intense anger or guilt.  Feeling alone and hopeless.  Feeling that your life is meaningless and empty.  Losing the desire to live. DIAGNOSIS Your health care provider may diagnose complicated grief if:  You have constant symptoms of grief for 6-12 months or longer.  Your symptoms are interfering with your ability to live your life. Your health care provider may want you to see a mental health care provider. Many symptoms of depression  are similar to the symptoms of complicated grief. It is important to be evaluated for complicated grief along with other mental health conditions. TREATMENT  Talk therapy with a mental health provider is the most common treatment for complicated grief. During therapy, you will learn healthy ways to cope with the loss of your loved one. In some cases, your mental health care provider may also recommend antidepressant medicines. HOME CARE INSTRUCTIONS  Take care of yourself.  Eat regular meals and maintain a healthy diet. Eat plenty of fruits, vegetables, and whole grains.  Try to get some exercise each day.  Keep regular hours for sleep. Try to get at least 8 hours of sleep each night.  Do not use drugs or alcohol to ease your symptoms.  Take medicines only as directed by your health care provider.  Spend time with friends and loved ones.  Consider joining a grief (bereavement) support group to help you deal with your loss.  Keep all follow-up visits as directed by your health care provider. This is important. SEEK MEDICAL CARE IF:  Your symptoms keep you from functioning normally.  Your symptoms do not get better with treatment. SEEK IMMEDIATE MEDICAL CARE IF:  You have serious thoughts of hurting yourself or someone else.  You have suicidal feelings.   This information is not intended to replace advice given to you by your health care provider. Make sure you discuss any questions you have with your health care provider.   Document Released: 09/12/2005   Document Revised: 06/03/2015 Document Reviewed: 02/20/2014 Elsevier Interactive Patient Education 2016 Elsevier Inc.  

## 2016-03-17 NOTE — Progress Notes (Signed)
Pre visit review using our clinic review tool, if applicable. No additional management support is needed unless otherwise documented below in the visit note. 

## 2016-05-05 ENCOUNTER — Ambulatory Visit (INDEPENDENT_AMBULATORY_CARE_PROVIDER_SITE_OTHER)
Admission: RE | Admit: 2016-05-05 | Discharge: 2016-05-05 | Disposition: A | Discharge status: Home / Self Care | Payer: Medicare PPO | Source: Ambulatory Visit | Attending: Family Medicine | Admitting: Family Medicine

## 2016-05-05 ENCOUNTER — Ambulatory Visit (INDEPENDENT_AMBULATORY_CARE_PROVIDER_SITE_OTHER): Payer: Medicare PPO | Admitting: Family Medicine

## 2016-05-05 ENCOUNTER — Encounter: Payer: Self-pay | Admitting: Family Medicine

## 2016-05-05 VITALS — BP 110/76 | HR 80 | Temp 97.6°F | Wt 153.0 lb

## 2016-05-05 DIAGNOSIS — E1159 Type 2 diabetes mellitus with other circulatory complications: Secondary | ICD-10-CM

## 2016-05-05 DIAGNOSIS — Z7189 Other specified counseling: Secondary | ICD-10-CM

## 2016-05-05 DIAGNOSIS — M25551 Pain in right hip: Secondary | ICD-10-CM

## 2016-05-05 DIAGNOSIS — I10 Essential (primary) hypertension: Secondary | ICD-10-CM

## 2016-05-05 DIAGNOSIS — Z794 Long term (current) use of insulin: Secondary | ICD-10-CM

## 2016-05-05 DIAGNOSIS — E785 Hyperlipidemia, unspecified: Secondary | ICD-10-CM

## 2016-05-05 MED ORDER — LISINOPRIL 10 MG PO TABS: 10.0000 mg | ORAL_TABLET | Freq: Every day | ORAL | 3 refills | Status: DC

## 2016-05-05 NOTE — Patient Instructions (Addendum)
Bring me copy of colonoscopy report.  Trial lisinopril 10mg  daily instead of losartan. Watch for cough.  Hip xray today. Schedule appointment up front to see Dr Lorelei Pont. Do bursitis exercises provided today.  Return as needed or in 3-4 months for wellness visit.

## 2016-05-05 NOTE — Assessment & Plan Note (Signed)
Chronic, stable.  Pt endorses worsening acne on losartan. Will trial lisinopril.

## 2016-05-05 NOTE — Progress Notes (Signed)
Pre visit review using our clinic review tool, if applicable. No additional management support is needed unless otherwise documented below in the visit note. 

## 2016-05-05 NOTE — Progress Notes (Signed)
BP 110/76   Pulse 80   Temp 97.6 F (36.4 C) (Oral)   Wt 153 lb (69.4 kg)   BMI 26.26 kg/m    CC: 3 mo f/u visit Subjective:    Patient ID: KHILYN ZAJICEK, female    DOB: Dec 09, 1940, 75 y.o.   MRN: [id number]  HPI: DYLANA GALEY is a 75 y.o. female presenting on 05/05/2016 for Follow-up   Husband passed away in Mar 20, 2023 this year. Saw Regina with grief reaction, at that time declined pharmacotherapy. She has been following with grief counselor through hospice but hasn't needed to see recently. Initially some trouble with sleep, but doing better since she changed her bed.   R hip pain ongoing for last 2-3 months while caring for husband - while she was lifting her husband. Points to lateral R hip. H/o L hip replacement  HLD - intolerant to crestor twice weekly.   DM - followed by [facility] endo, last seen 12/2015. F/u appt scheduled 07/2016.  Lab Results  Component Value Date   HGBA1C 7.2 (H) 05/26/2015  A1c 7.3% 12/2015.   Relevant past medical, surgical, family and social history reviewed and updated as indicated. Interim medical history since our last visit reviewed. Allergies and medications reviewed and updated. Current Outpatient Prescriptions on File Prior to Visit  Medication Sig  . aspirin EC 81 MG tablet Take 81 mg by mouth daily.  . BD ULTRA-FINE LANCETS lancets Use as instructed upto 6 times daily  . Cholecalciferol 50000 UNITS TABS Take 25,000 mg by mouth every 14 (fourteen) days. Take one tablet by mouth every 14 days (2 weeks)  . Cyanocobalamin (B-12 SL) Place 1 tablet under the tongue daily.   Marland Kitchen ibuprofen (ADVIL,MOTRIN) 600 MG tablet Take 1 tablet (600 mg total) by mouth every 8 (eight) hours as needed.  . insulin aspart (NOVOLOG) 100 UNIT/ML injection Use in an insulin pump upto 50 units per day (Patient taking differently: Inject 23.5-27 Units into the skin. Use in an insulin pump upto 50 units per day)  . Lecithin 400 MG CAPS Take 1 capsule by mouth daily.  Marland Kitchen  LEVOXYL 75 MCG tablet TAKE 1 TABLET 6 DAYS A WEEK, AND 1.5 TABLETS BY MOUTH ONE DAY A WEEK.  . metFORMIN (GLUCOPHAGE-XR) 500 MG 24 hr tablet Take 1 tablet (500 mg total) by mouth 3 (three) times daily.  . rosuvastatin (CRESTOR) 5 MG tablet Take 1 tablet (5 mg total) by mouth daily. (Patient taking differently: Take 5 mg by mouth 2 (two) times a week. )   No current facility-administered medications on file prior to visit.     Review of Systems Per HPI unless specifically indicated in ROS section     Objective:    BP 110/76   Pulse 80   Temp 97.6 F (36.4 C) (Oral)   Wt 153 lb (69.4 kg)   BMI 26.26 kg/m   Wt Readings from Last 3 Encounters:  05/05/16 153 lb (69.4 kg)  03/17/16 153 lb 12 oz (69.7 kg)  02/02/16 158 lb 8 oz (71.9 kg)    Physical Exam  Constitutional: She appears well-developed and well-nourished. No distress.  HENT:  Mouth/Throat: Oropharynx is clear and moist. No oropharyngeal exudate.  Eyes: Conjunctivae are normal. Pupils are equal, round, and reactive to light.  Cardiovascular: Normal rate, regular rhythm, normal heart sounds and intact distal pulses.   No murmur heard. Pulmonary/Chest: Effort normal and breath sounds normal. No respiratory distress. She has no wheezes. She has no  rales.  Musculoskeletal: She exhibits no edema.  + pain with int/ext rotation at right hip. Neg FABER. + pain with palpation at R GTB.   Skin: Skin is warm and dry. No rash noted.  Psychiatric: She has a normal mood and affect.  Nursing note and vitals reviewed.  Results for orders placed or performed in visit on 02/04/16  Vitamin B12  Result Value Ref Range   VITAMIN B12 1,332   TSH+Free T4  Result Value Ref Range   TSH 1.060    Free T4 1.32   Microalbumin / creatinine urine ratio  Result Value Ref Range   MICROALB/CREAT RATIO <5.0   Lipid panel  Result Value Ref Range   Cholesterol 236    Triglycerides 175    HDL 41    LDL (calc) 160   Comprehensive metabolic  panel  Result Value Ref Range   Glucose 91    Creat 0.89    Sodium 141    Potassium 4.2 mmol/L   Albumin 4.1    Total Bilirubin 0.3 mg/dL   Alkaline Phosphatase 74 U/L   AST 24 U/L   ALT 21   Hemoglobin A1c  Result Value Ref Range   A1c 7.3       Assessment & Plan:  Over 25 minutes were spent face-to-face with the patient during this encounter and >50% of that time was spent on counseling and coordination of care  Problem List Items Addressed This Visit    Diabetes type 2, controlled ([place])    F/u appt with endo scheduled 07/2016.       Relevant Medications   lisinopril (PRINIVIL,ZESTRIL) 10 MG tablet   Essential hypertension    Chronic, stable.  Pt endorses worsening acne on losartan. Will trial lisinopril.       Relevant Medications   lisinopril (PRINIVIL,ZESTRIL) 10 MG tablet   Grief counseling    Support provided. Pt feels overall doing well, has supportive family and church. Has not returned to grief counselor. Declines pharmacotherapy at this time. Change in bed has led to better sleep.       Hyperlipidemia    Suggested f/u with lipid clinic/cardiology. Continues low dose crestor a few times a week.       Relevant Medications   lisinopril (PRINIVIL,ZESTRIL) 10 MG tablet   Right hip pain - Primary    Exam consistent with worsening hip arthritis despite partial replacement - update hip xray today. Exam also consistent with R trochanteric bursitis - provided with exercises from [facility] pt advisor as well as suggested f/u with Dr Lorelei Pont for further evaluation of bursitis.      Relevant Orders   DG HIP UNILAT WITH PELVIS 2-3 VIEWS RIGHT    Other Visit Diagnoses   None.      Follow up plan: Return in about 3 months (around 08/05/2016), or as needed, for medicare wellness visit.  [name], MD

## 2016-05-05 NOTE — Assessment & Plan Note (Signed)
Suggested f/u with lipid clinic/cardiology. Continues low dose crestor a few times a week.

## 2016-05-05 NOTE — Assessment & Plan Note (Signed)
Exam consistent with worsening hip arthritis despite partial replacement - update hip xray today. Exam also consistent with R trochanteric bursitis - provided with exercises from [facility] pt advisor as well as suggested f/u with Dr Lorelei Pont for further evaluation of bursitis.

## 2016-05-05 NOTE — Assessment & Plan Note (Signed)
F/u appt with endo scheduled 07/2016.

## 2016-05-05 NOTE — Assessment & Plan Note (Signed)
Support provided. Pt feels overall doing well, has supportive family and church. Has not returned to grief counselor. Declines pharmacotherapy at this time. Change in bed has led to better sleep.

## 2016-05-12 ENCOUNTER — Encounter: Payer: Self-pay | Admitting: Family Medicine

## 2016-05-12 ENCOUNTER — Ambulatory Visit (INDEPENDENT_AMBULATORY_CARE_PROVIDER_SITE_OTHER): Payer: Medicare PPO | Admitting: Family Medicine

## 2016-05-12 VITALS — BP 124/60 | HR 77 | Temp 98.4°F | Ht 64.0 in | Wt 153.5 lb

## 2016-05-12 DIAGNOSIS — M25551 Pain in right hip: Secondary | ICD-10-CM | POA: Diagnosis not present

## 2016-05-12 NOTE — Progress Notes (Signed)
Pre visit review using our clinic review tool, if applicable. No additional management support is needed unless otherwise documented below in the visit note. 

## 2016-05-12 NOTE — Progress Notes (Signed)
Dr. Frederico Hamman T. Reco Shonk, MD, [facility] [place], [address] Phone: [phone] Fax: [phone]  05/12/2016  Patient: Cassandra Benson, MRN: [id number], DOB: [birth year], 75 y.o.  Primary Physician:  [name], MD   Chief Complaint  Patient presents with  . Hip Pain    Right-Hip Replacement 2013   Subjective:   DASHANA Benson is a 75 y.o. very pleasant female patient who presents with the following:  Having pain in the posterior buttocks on the right.   Has been hurting off and on for a year. Was helping him up out of his chair and it really hurt bad during that time posterior. Taking some motrin. Does not like to take too much stuff.   Bending over hurts really bad.   She had a total hip arthroplasty done 4 years ago.  Over the course the last year, her husband was rapidly diagnosed with fatal stage IV cancer, and she cared for him in his dying days and is still actively grieving.  She thinks that she probably aggravated her hip when she was lifting him to some degree.  She is not having any radicular symptoms.  Pain is in the posterior aspect of the buttocks as well as some rare pain in the groin.  Past Medical History, Surgical History, Social History, Family History, Problem List, Medications, and Allergies have been reviewed and updated if relevant.  Patient Active Problem List   Diagnosis Date Noted  . Right hip pain 05/05/2016  . Thumb injury 02/02/2016  . Grief counseling 02/02/2016  . Medicare annual wellness visit, subsequent 06/19/2015  . Health maintenance examination 06/19/2015  . Advanced care planning/counseling discussion 06/19/2015  . Carotid stenosis, asymptomatic 06/19/2015  . Vitamin D deficiency   . Family history of premature CAD 06/02/2015  . Chest pain 05/26/2015  . Midline low back pain 09/25/2014  . 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 Hyperlipidemia 07/30/2014   . Diabetes type 2, controlled ([place]) 07/17/2014  . Essential hypertension 07/17/2014  . Hypothyroidism 07/17/2014  . Adjustment disorder with mixed anxiety and depressed mood 07/17/2014  . Apnea, sleep 08/22/2012  . LBP (low back pain) 02/27/2012  . SLE (systemic lupus erythematosus) ([place]) 01/06/2012    Past Medical History:  Diagnosis Date  . Allergy   . Arthritis   . Carotid stenosis, asymptomatic 06/19/2015   123456 RICA 123456 LICA rpt 1 yr ([id number])   . Diabetes mellitus without complication ([place])   . Fibromyalgia    prior PCP  . GERD (gastroesophageal reflux disease)    prior PCP  . Glaucoma    Narrow angle  . History of blood clots    DVT, in 20s, none since  . History of chicken pox   . History of diverticulitis   . History of pericarditis    with hospitalization  . History of pneumonia 2014  . History of shingles   . History of UTI   . Hyperlipidemia   . Hypothyroidism   . Lupus (systemic lupus erythematosus) ([place])   . Shoulder pain left   h/o RTC tendonitis and adhesive capsulitis  . Sleep apnea    prior PCP  . Vitamin D deficiency    prior PCP    Past Surgical History:  Procedure Laterality Date  . ABDOMINAL HYSTERECTOMY  1978   fibroids and menorrhagia, ovaries remain  . [facility] normal per patient  .  PARTIAL HIP ARTHROPLASTY  2013   Right hip replacement  . TONSILLECTOMY AND ADENOIDECTOMY    . VAGINAL DELIVERY     x2, no complications    Social History   Social History  . Marital status: Married    Spouse name: N/A  . Number of children: N/A  . Years of education: N/A   Occupational History  . Not on file.   Social History Main Topics  . Smoking status: Never Smoker  . Smokeless tobacco: Never Used  . Alcohol use No  . Drug use: No  . Sexual activity: Not on file   Other Topics Concern  . Not on file   Social History Narrative   Lives in [place] now. Recently moved from [place]. Lives with  husband.   No pets.   Mother of Earnest Armfield.   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 CAD Mother 65    MI, aortic valve issues  . COPD Mother   . Lupus Mother   . CAD Father 55    CABG x2, aortic valve replacement  . Diabetes Sister     deceased  . Stroke Sister   . Alcohol abuse Brother   . CAD Brother 57    MI  . Stroke Brother   . Seizures Son   . COPD Brother   . CAD Brother 22    stent  . Diabetes Brother   . Depression Grandchild   . Cancer Maternal Aunt     breast  . Breast cancer Maternal Aunt   . CAD Sister 64    stents  . Breast cancer Maternal Aunt     Allergies  Allergen Reactions  . Iodinated Diagnostic Agents Other (See Comments)    Itching (severe) and chest tightness  . Codeine Nausea Only  . Influenza Vaccines     Muscle weakness  . Valsartan     Allergy to generic only Other reaction(s): Cough "Hacking" cough  . Erythromycin Rash and Swelling  . Penicillins Rash and Swelling  . Sulfa Antibiotics Rash    Medication list reviewed and updated in full in [facility] [facility].  GEN: No fevers, chills. Nontoxic. Primarily MSK c/o today. MSK: Detailed in the HPI GI: tolerating PO intake without difficulty Neuro: No numbness, parasthesias, or tingling associated. Otherwise the pertinent positives of the ROS are noted above.   Objective:   BP 124/60   Pulse 77   Temp 98.4 F (36.9 C) (Oral)   Ht 5\' 4"  (1.626 m)   Wt 153 lb 8 oz (69.6 kg)   BMI 26.35 kg/m    GEN: WDWN, NAD, Non-toxic, Alert & Oriented x 3 HEENT: Atraumatic, Normocephalic.  Ears and Nose: No external deformity. EXTR: No clubbing/cyanosis/edema NEURO: Normal gait. Mild limp. PSYCH: Normally interactive. Conversant.  Not depressed or anxious appearing.  Calm demeanor.   HIP EXAM: SIDE: R ROM: Abduction, Flexion, Internal and External range of motion: mild posterior pain on R with terminal motion Pain with terminal IROM and EROM: mild GTB: NT SLR: NEG Knees: No effusion FABER: NT REVERSE FABER: NT, neg Piriformis: NT at direct palpation Str: flexion: 3/5 abduction: 3/5 adduction: 3/5 Strength testing tender  L sided is all  4+-5/5  Radiology: Dg Hip Unilat With Pelvis 2-3 Views Right  Result Date: 05/05/2016 CLINICAL DATA:  Right hip pain, hip replacement in 2013, no trauma EXAM: DG HIP (WITH OR WITHOUT PELVIS) 2-3V RIGHT COMPARISON:  None. FINDINGS: The femoral and acetabular components of the right total hip replacement are in good position. No complicating features are seen. Minimal degenerative change of the left hip is noted. The SI joints are corticated. IMPRESSION: 1. Right total hip replacement components in good position with no complicating features. 2. Mild degenerative change of the left hip. Electronically Signed   By: Ivar Drape M.D.   On: 05/05/2016 16:14    Assessment and Plan:   Right hip pain - Plan: Ambulatory referral to Physical Therapy  The patient has become extremely deconditioned and the musculature surrounding her hip on the right is incredibly weak.  Hip films look stable with no loosening.  Rehab and recheck 6 weeks  Orders Placed This Encounter  Procedures  . Ambulatory referral to Physical Therapy    Signed,  Frederico Hamman T. Shamya Macfadden, MD   Patient's Medications  New Prescriptions   No medications on file  Previous Medications   ASPIRIN EC 81 MG TABLET    Take 81 mg by mouth daily.   BD ULTRA-FINE LANCETS LANCETS    Use as instructed upto 6 times daily   CHOLECALCIFEROL 50000 UNITS TABS    Take 25,000 mg by mouth every 14 (fourteen) days. Take one tablet by mouth every 14 days (2 weeks)   CYANOCOBALAMIN (B-12 SL)    Place 1 tablet under the tongue daily.      IBUPROFEN (ADVIL,MOTRIN) 600 MG TABLET    Take 1 tablet (600 mg total) by mouth every 8 (eight) hours as needed.   INSULIN ASPART (NOVOLOG) 100 UNIT/ML INJECTION    Use in an insulin pump upto 50 units per day   LECITHIN 400 MG CAPS    Take 1 capsule by mouth daily.   LEVOXYL 75 MCG TABLET    TAKE 1 TABLET 6 DAYS A WEEK, AND 1.5 TABLETS BY MOUTH ONE DAY A WEEK.   LISINOPRIL (PRINIVIL,ZESTRIL) 10 MG TABLET    Take 1 tablet (10 mg total) by mouth daily.   METFORMIN (GLUCOPHAGE-XR) 500 MG 24 HR TABLET    Take 1 tablet (500 mg total) by mouth 3 (three) times daily.   ROSUVASTATIN (CRESTOR) 5 MG TABLET    Take 1 tablet (5 mg total) by mouth daily.  Modified Medications   No medications on file  Discontinued Medications   No medications on file

## 2016-07-13 ENCOUNTER — Ambulatory Visit: Payer: Medicare PPO | Admitting: Family Medicine

## 2016-08-08 ENCOUNTER — Ambulatory Visit: Payer: Medicare PPO

## 2016-08-09 ENCOUNTER — Ambulatory Visit: Payer: Medicare PPO

## 2016-08-09 ENCOUNTER — Ambulatory Visit (INDEPENDENT_AMBULATORY_CARE_PROVIDER_SITE_OTHER): Payer: Medicare PPO

## 2016-08-09 VITALS — BP 118/68 | HR 88 | Temp 97.8°F | Ht 63.75 in | Wt 150.8 lb

## 2016-08-09 DIAGNOSIS — E559 Vitamin D deficiency, unspecified: Secondary | ICD-10-CM

## 2016-08-09 DIAGNOSIS — E039 Hypothyroidism, unspecified: Secondary | ICD-10-CM

## 2016-08-09 DIAGNOSIS — Z0000 Encounter for general adult medical examination without abnormal findings: Secondary | ICD-10-CM | POA: Diagnosis not present

## 2016-08-09 DIAGNOSIS — Z794 Long term (current) use of insulin: Secondary | ICD-10-CM

## 2016-08-09 DIAGNOSIS — E785 Hyperlipidemia, unspecified: Secondary | ICD-10-CM

## 2016-08-09 DIAGNOSIS — E1159 Type 2 diabetes mellitus with other circulatory complications: Secondary | ICD-10-CM | POA: Diagnosis not present

## 2016-08-09 LAB — LDL CHOLESTEROL, DIRECT: LDL DIRECT: 177 mg/dL

## 2016-08-09 LAB — BASIC METABOLIC PANEL
BUN: 13 mg/dL (ref 6–23)
CALCIUM: 9.9 mg/dL (ref 8.4–10.5)
CO2: 29 meq/L (ref 19–32)
CREATININE: 0.91 mg/dL (ref 0.40–1.20)
Chloride: 103 mEq/L (ref 96–112)
GFR: 64.21 mL/min (ref >60.00)
Glucose, Bld: 113 mg/dL — ABNORMAL HIGH (ref 70–99)
Potassium: 4.4 mEq/L (ref 3.5–5.1)
Sodium: 139 mEq/L (ref 135–145)

## 2016-08-09 LAB — LIPID PANEL
CHOL/HDL RATIO: 6
CHOLESTEROL: 245 mg/dL — AB (ref 0–200)
HDL: 41.8 mg/dL (ref >39.00)
NONHDL: 203.38
Triglycerides: 258 mg/dL — ABNORMAL HIGH (ref 0.0–149.0)
VLDL: 51.6 mg/dL — ABNORMAL HIGH (ref 0.0–40.0)

## 2016-08-09 LAB — TSH: TSH: 0.42 u[IU]/mL (ref 0.35–4.50)

## 2016-08-09 LAB — VITAMIN D 25 HYDROXY (VIT D DEFICIENCY, FRACTURES): VITD: 56.68 ng/mL (ref 30.00–100.00)

## 2016-08-09 LAB — HEMOGLOBIN A1C: HEMOGLOBIN A1C: 7.7 % — AB (ref 4.6–6.5)

## 2016-08-09 NOTE — Patient Instructions (Signed)
Cassandra Benson , Thank you for taking time to come for your Medicare Wellness Visit. I appreciate your ongoing commitment to your health goals. Please review the following plan we discussed and let me know if I can assist you in the future.   These are the goals we discussed: Goals    . Increase physical activity          Starting 08/09/2016, I will continue to walk at least 20 min 6 days per week.        This is a list of the screening recommended for you and due dates:  Health Maintenance  Topic Date Due  . Shingles Vaccine  08/09/2026*  . Complete foot exam   10/01/2016  . Eye exam for diabetics  11/03/2016  . Hemoglobin A1C  02/06/2017  . Mammogram  11/19/2017  . DTaP/Tdap/Td vaccine (2 - Td) 10/06/2021  . Tetanus Vaccine  10/06/2021  . Colon Cancer Screening  10/17/2022  . DEXA scan (bone density measurement)  Completed  . Pneumonia vaccines  Completed  *Topic was postponed. The date shown is not the original due date.   Preventive Care for Adults  A healthy lifestyle and preventive care can promote health and wellness. Preventive health guidelines for adults include the following key practices.  . A routine yearly physical is a good way to check with your health care provider about your health and preventive screening. It is a chance to share any concerns and updates on your health and to receive a thorough exam.  . Visit your dentist for a routine exam and preventive care every 6 months. Brush your teeth twice a day and floss once a day. Good oral hygiene prevents tooth decay and gum disease.  . The frequency of eye exams is based on your age, health, family medical history, use  of contact lenses, and other factors. Follow your health care provider's ecommendations for frequency of eye exams.  . Eat a healthy diet. Foods like vegetables, fruits, whole grains, low-fat dairy products, and lean protein foods contain the nutrients you need without too many calories. Decrease  your intake of foods high in solid fats, added sugars, and salt. Eat the right amount of calories for you. Get information about a proper diet from your health care provider, if necessary.  . Regular physical exercise is one of the most important things you can do for your health. Most adults should get at least 150 minutes of moderate-intensity exercise (any activity that increases your heart rate and causes you to sweat) each week. In addition, most adults need muscle-strengthening exercises on 2 or more days a week.  Silver Sneakers may be a benefit available to you. To determine eligibility, you may visit the website: www.silversneakers.com or contact program at (959)681-1433 Mon-Fri between 8AM-8PM.   . Maintain a healthy weight. The body mass index (BMI) is a screening tool to identify possible weight problems. It provides an estimate of body fat based on height and weight. Your health care provider can find your BMI and can help you achieve or maintain a healthy weight.   For adults 20 years and older: ? A BMI below 18.5 is considered underweight. ? A BMI of 18.5 to 24.9 is normal. ? A BMI of 25 to 29.9 is considered overweight. ? A BMI of 30 and above is considered obese.   . Maintain normal blood lipids and cholesterol levels by exercising and minimizing your intake of saturated fat. Eat a balanced diet with plenty  of fruit and vegetables. Blood tests for lipids and cholesterol should begin at age 36 and be repeated every 5 years. If your lipid or cholesterol levels are high, you are over 50, or you are at high risk for heart disease, you may need your cholesterol levels checked more frequently. Ongoing high lipid and cholesterol levels should be treated with medicines if diet and exercise are not working.  . If you smoke, find out from your health care provider how to quit. If you do not use tobacco, please do not start.  . If you choose to drink alcohol, please do not consume more than  2 drinks per day. One drink is considered to be 12 ounces (355 mL) of beer, 5 ounces (148 mL) of wine, or 1.5 ounces (44 mL) of liquor.  . If you are 55-47 years old, ask your health care provider if you should take aspirin to prevent strokes.  . Use sunscreen. Apply sunscreen liberally and repeatedly throughout the day. You should seek shade when your shadow is shorter than you. Protect yourself by wearing long sleeves, pants, a wide-brimmed hat, and sunglasses year round, whenever you are outdoors.  . Once a month, do a whole body skin exam, using a mirror to look at the skin on your back. Tell your health care provider of new moles, moles that have irregular borders, moles that are larger than a pencil eraser, or moles that have changed in shape or color.

## 2016-08-09 NOTE — Progress Notes (Signed)
Pre visit review using our clinic review tool, if applicable. No additional management support is needed unless otherwise documented below in the visit note. 

## 2016-08-09 NOTE — Progress Notes (Signed)
Subjective:   Cassandra Benson is a 75 y.o. female who presents for Medicare Annual (Subsequent) preventive examination.  Review of Systems:  N/A Cardiac Risk Factors include: advanced age (>87men, >18 women);diabetes mellitus;dyslipidemia;hypertension     Objective:     Vitals: BP 118/68 (BP Location: Right Arm, Patient Position: Sitting, Cuff Size: Normal)   Pulse 88   Temp 97.8 F (36.6 C) (Oral)   Ht 5' 3.75" (1.619 m) Comment: no shoes  Wt 150 lb 12 oz (68.4 kg)   SpO2 97%   BMI 26.08 kg/m   Body mass index is 26.08 kg/m.   Tobacco History  Smoking Status  . Never Smoker  Smokeless Tobacco  . Never Used     Counseling given: No   Past Medical History:  Diagnosis Date  . Allergy   . Arthritis   . Carotid stenosis, asymptomatic 06/19/2015   123456 RICA 123456 LICA rpt 1 yr ([id number])   . Diabetes mellitus without complication ([place])   . Fibromyalgia    prior PCP  . GERD (gastroesophageal reflux disease)    prior PCP  . Glaucoma    Narrow angle  . History of blood clots    DVT, in 20s, none since  . History of chicken pox   . History of diverticulitis   . History of pericarditis    with hospitalization  . History of pneumonia 2014  . History of shingles   . History of UTI   . Hyperlipidemia   . Hypothyroidism   . Lupus (systemic lupus erythematosus) ([place])   . Shoulder pain left   h/o RTC tendonitis and adhesive capsulitis  . Sleep apnea    prior PCP  . Vitamin D deficiency    prior PCP   Past Surgical History:  Procedure Laterality Date  . ABDOMINAL HYSTERECTOMY  1978   fibroids and menorrhagia, ovaries remain  . [facility] normal per patient  . PARTIAL HIP ARTHROPLASTY  2013   Right hip replacement  . TONSILLECTOMY AND ADENOIDECTOMY    . VAGINAL DELIVERY     x2, no complications   Family History  Problem Relation Age of Onset  . CAD Mother 29    MI, aortic valve issues  . COPD Mother   . Lupus Mother    . CAD Father 74    CABG x2, aortic valve replacement  . Stroke Sister   . Alcohol abuse Brother   . CAD Brother 28    MI  . Stroke Brother   . Seizures Son   . COPD Brother   . CAD Brother 32    stent  . Diabetes Brother   . Depression Grandchild   . Cancer Maternal Aunt     breast  . Breast cancer Maternal Aunt   . Diabetes Sister     deceased  . CAD Sister 24    stents  . Breast cancer Maternal Aunt    History  Sexual Activity  . Sexual activity: Not on file    Outpatient Encounter Prescriptions as of 08/09/2016  Medication Sig  . aspirin EC 81 MG tablet Take 81 mg by mouth daily.  . BD ULTRA-FINE LANCETS lancets Use as instructed upto 6 times daily  . Cholecalciferol 50000 UNITS TABS Take 25,000 mg by mouth every 14 (fourteen) days. Take one tablet by mouth every 14 days (2 weeks)  . Cyanocobalamin (B-12 SL) Place 1 drop under the tongue daily.   Marland Kitchen ibuprofen (  ADVIL,MOTRIN) 600 MG tablet Take 1 tablet (600 mg total) by mouth every 8 (eight) hours as needed.  . insulin aspart (NOVOLOG) 100 UNIT/ML injection Use in an insulin pump upto 50 units per day (Patient taking differently: Inject 23.5-27 Units into the skin. Use in an insulin pump upto 50 units per day)  . Lecithin 400 MG CAPS Take 1 capsule by mouth daily.  Marland Kitchen LEVOXYL 75 MCG tablet TAKE 1 TABLET 6 DAYS A WEEK, AND 1.5 TABLETS BY MOUTH ONE DAY A WEEK.  Marland Kitchen lisinopril (PRINIVIL,ZESTRIL) 10 MG tablet Take 1 tablet (10 mg total) by mouth daily.  . metFORMIN (GLUCOPHAGE-XR) 500 MG 24 hr tablet Take 1 tablet (500 mg total) by mouth 3 (three) times daily.  . rosuvastatin (CRESTOR) 5 MG tablet Take 1 tablet (5 mg total) by mouth daily. (Patient taking differently: Take 5 mg by mouth 2 (two) times a week. )   No facility-administered encounter medications on file as of 08/09/2016.     Activities of Daily Living In your present state of health, do you have any difficulty performing the following activities: 08/09/2016    Hearing? N  Vision? N  Difficulty concentrating or making decisions? N  Walking or climbing stairs? N  Dressing or bathing? N  Doing errands, shopping? N  Preparing Food and eating ? N  Using the Toilet? N  In the past six months, have you accidently leaked urine? N  Do you have problems with loss of bowel control? Y  Managing your Medications? N  Managing your Finances? N  Housekeeping or managing your Housekeeping? N  Some recent data might be hidden    Patient Care Team: [name], MD as PCP - General (Family Medicine) Birder Robson, MD as Referring Physician (Ophthalmology)    Assessment:     Hearing Screening   125Hz  250Hz  500Hz  1000Hz  2000Hz  3000Hz  4000Hz  6000Hz  8000Hz   Right ear:   40 0 40  0    Left ear:   40 40 40  0    Vision Screening Comments: Last vision exam in Feb 2017 with Dr. Merla Riches   Exercise Activities and Dietary recommendations Current Exercise Habits: Home exercise routine, Type of exercise: walking, Time (Minutes): 20, Frequency (Times/Week): 6, Weekly Exercise (Minutes/Week): 120, Intensity: Moderate, Exercise limited by: None identified  Goals    . Increase physical activity          Starting 08/09/2016, I will continue to walk at least 20 min 6 days per week.       Fall Risk Fall Risk  08/09/2016 06/19/2015 06/19/2015 09/25/2014  Falls in the past year? No No No No   Depression Screen PHQ 2/9 Scores 08/09/2016 06/19/2015 06/19/2015 09/25/2014  PHQ - 2 Score 0 0 0 0     Cognitive Function MMSE - Mini Mental State Exam 08/09/2016  Orientation to time 5  Orientation to Place 5  Registration 3  Attention/ Calculation 0  Recall 3  Language- name 2 objects 0  Language- repeat 1  Language- follow 3 step command 3  Language- read & follow direction 0  Write a sentence 0  Copy design 0  Total score 20       PLEASE NOTE: A Mini-Cog screen was completed. Maximum score is 20. A value of 0 denotes this part of Folstein MMSE was  not completed or the patient failed this part of the Mini-Cog screening.   Mini-Cog Screening Orientation to Time - Max 5 pts Orientation to Place - Max  5 pts Registration - Max 3 pts Recall - Max 3 pts Language Repeat - Max 1 pts Language Follow 3 Step Command - Max 3 pts   Immunization History  Administered Date(s) Administered  . Pneumococcal Conjugate-13 09/23/2014  . Pneumococcal Polysaccharide-23 07/17/2013  . Tdap 10/07/2011   Screening Tests Health Maintenance  Topic Date Due  . ZOSTAVAX  08/09/2026 (Originally 06/27/2002)  . FOOT EXAM  10/01/2016  . OPHTHALMOLOGY EXAM  11/03/2016  . HEMOGLOBIN A1C  02/06/2017  . MAMMOGRAM  11/19/2017  . DTaP/Tdap/Td (2 - Td) 10/06/2021  . TETANUS/TDAP  10/06/2021  . COLONOSCOPY  10/17/2022  . DEXA SCAN  Completed  . PNA vac Low Risk Adult  Completed      Plan:     I have personally reviewed and addressed the Medicare Annual Wellness questionnaire and have noted the following in the patient's chart:  A. Medical and social history B. Use of alcohol, tobacco or illicit drugs  C. Current medications and supplements D. Functional ability and status E.  Nutritional status F.  Physical activity G. Advance directives H. List of other physicians I.  Hospitalizations, surgeries, and ER visits in previous 12 months J.  [place] to include hearing, vision, cognitive, depression L. Referrals and appointments - none  In addition, I have reviewed and discussed with patient certain preventive protocols, quality metrics, and best practice recommendations. A written personalized care plan for preventive services as well as general preventive health recommendations were provided to patient.  See attached scanned questionnaire for additional information.   Signed,   Lindell Noe, MHA, BS, LPN Health Coach

## 2016-08-09 NOTE — Progress Notes (Signed)
PCP notes:   Health maintenance:  Shingles - pt declined A1C - completed  Abnormal screenings:   Hearing - failed  Patient concerns:   Pt reports right shoulder pain upon movement. Pain scale: 9/10. Pt stated pain has become increasingly worse over last 3-4 months.   Pt requested a disability placard. Application was submitted, signed by PCP, and approved for 6 mths. Pt states she cannot walk 200 feet without stopping to rest and is severely limited in her ability to walk due to arthritic and orthopedic conditions. Pt states she does use a cane as needed. NOTE: copy of application will be scanned to file.   Nurse concerns:  None  Next PCP appt:   08/15/16 @ 0930

## 2016-08-11 NOTE — Progress Notes (Signed)
I reviewed health advisor's note, was available for consultation, and agree with documentation and plan.  

## 2016-08-12 ENCOUNTER — Encounter: Payer: Medicare PPO | Admitting: Family Medicine

## 2016-08-15 ENCOUNTER — Encounter: Payer: Self-pay | Admitting: Family Medicine

## 2016-08-15 ENCOUNTER — Ambulatory Visit (INDEPENDENT_AMBULATORY_CARE_PROVIDER_SITE_OTHER): Payer: Medicare PPO | Admitting: Family Medicine

## 2016-08-15 VITALS — BP 134/70 | HR 72 | Temp 97.6°F | Wt 152.5 lb

## 2016-08-15 DIAGNOSIS — E1159 Type 2 diabetes mellitus with other circulatory complications: Secondary | ICD-10-CM

## 2016-08-15 DIAGNOSIS — Z794 Long term (current) use of insulin: Secondary | ICD-10-CM

## 2016-08-15 DIAGNOSIS — Z7189 Other specified counseling: Secondary | ICD-10-CM | POA: Diagnosis not present

## 2016-08-15 DIAGNOSIS — I6523 Occlusion and stenosis of bilateral carotid arteries: Secondary | ICD-10-CM | POA: Diagnosis not present

## 2016-08-15 DIAGNOSIS — E039 Hypothyroidism, unspecified: Secondary | ICD-10-CM

## 2016-08-15 DIAGNOSIS — Z0000 Encounter for general adult medical examination without abnormal findings: Secondary | ICD-10-CM

## 2016-08-15 DIAGNOSIS — I10 Essential (primary) hypertension: Secondary | ICD-10-CM | POA: Diagnosis not present

## 2016-08-15 DIAGNOSIS — Z8249 Family history of ischemic heart disease and other diseases of the circulatory system: Secondary | ICD-10-CM

## 2016-08-15 DIAGNOSIS — E785 Hyperlipidemia, unspecified: Secondary | ICD-10-CM

## 2016-08-15 DIAGNOSIS — M25511 Pain in right shoulder: Secondary | ICD-10-CM | POA: Insufficient documentation

## 2016-08-15 NOTE — Assessment & Plan Note (Signed)
Update EKG. rec return to Dr Rockey Situ for f/u.

## 2016-08-15 NOTE — Assessment & Plan Note (Addendum)
69mo s/p husband's death from metastatic colon cancer.  Overall doing ok, planning to return to work part time.

## 2016-08-15 NOTE — Assessment & Plan Note (Signed)
Chronic, stable. Continue current regimen. 

## 2016-08-15 NOTE — Assessment & Plan Note (Signed)
Anticipate shoulder bursitis as well as GH joint arthritis based on exam today. Concern for developing AC in DM history. rec supportive care - limited NSAID PRN, heating pad. Provided with exercises and resistance band from [facility] pt advisor. Update if no improvement for xray.

## 2016-08-15 NOTE — Assessment & Plan Note (Signed)
Advanced directive discussion - has at home. HCPOA is son (Matt and Jill). Will bring me copy.  

## 2016-08-15 NOTE — Assessment & Plan Note (Signed)
Update US.  

## 2016-08-15 NOTE — Assessment & Plan Note (Addendum)
Refer to local endo to establish care. Pt requests Dr Buddy Duty. Foot exam today.

## 2016-08-15 NOTE — Patient Instructions (Addendum)
Pass by our referral coordinators to set up appointment with Dr Buddy Duty Bring me copy of your bone density scan. Bring me copy of your living will. EKG today.  Schedule follow up with Dr Rockey Situ.  We will update carotid ultrasound.  I think you have combination of bursitis and some arthritis of that shoulder joint. Do exercises provided today. Heating pad to area. Limited aleve/advil as needed. If no improvement, let us know for xrays.   Health Maintenance, Female Introduction Adopting a healthy lifestyle and getting preventive care can go a long way to promote health and wellness. Talk with your health care provider about what schedule of regular examinations is right for you. This is a good chance for you to check in with your provider about disease prevention and staying healthy. In between checkups, there are plenty of things you can do on your own. Experts have done a lot of research about which lifestyle changes and preventive measures are most likely to keep you healthy. Ask your health care provider for more information. Weight and diet Eat a healthy diet  Be sure to include plenty of vegetables, fruits, low-fat dairy products, and lean protein.  Do not eat a lot of foods high in solid fats, added sugars, or salt.  Get regular exercise. This is one of the most important things you can do for your health.  Most adults should exercise for at least 150 minutes each week. The exercise should increase your heart rate and make you sweat (moderate-intensity exercise).  Most adults should also do strengthening exercises at least twice a week. This is in addition to the moderate-intensity exercise. Maintain a healthy weight  Body mass index (BMI) is a measurement that can be used to identify possible weight problems. It estimates body fat based on height and weight. Your health care provider can help determine your BMI and help you achieve or maintain a healthy weight.  For females 62 years of  age and older:  A BMI below 18.5 is considered underweight.  A BMI of 18.5 to 24.9 is normal.  A BMI of 25 to 29.9 is considered overweight.  A BMI of 30 and above is considered obese. Watch levels of cholesterol and blood lipids  You should start having your blood tested for lipids and cholesterol at 74 years of age, then have this test every 5 years.  You may need to have your cholesterol levels checked more often if:  Your lipid or cholesterol levels are high.  You are older than 74 years of age.  You are at high risk for heart disease. Cancer screening Lung Cancer  Lung cancer screening is recommended for adults 38-53 years old who are at high risk for lung cancer because of a history of smoking.  A yearly low-dose CT scan of the lungs is recommended for people who:  Currently smoke.  Have quit within the past 15 years.  Have at least a 30-pack-year history of smoking. A pack year is smoking an average of one pack of cigarettes a day for 1 year.  Yearly screening should continue until it has been 15 years since you quit.  Yearly screening should stop if you develop a health problem that would prevent you from having lung cancer treatment. Breast Cancer  Practice breast self-awareness. This means understanding how your breasts normally appear and feel.  It also means doing regular breast self-exams. Let your health care provider know about any changes, no matter how small.  If you  are in your 20s or 30s, you should have a clinical breast exam (CBE) by a health care provider every 1-3 years as part of a regular health exam.  If you are 28 or older, have a CBE every year. Also consider having a breast X-ray (mammogram) every year.  If you have a family history of breast cancer, talk to your health care provider about genetic screening.  If you are at high risk for breast cancer, talk to your health care provider about having an MRI and a mammogram every  year.  Breast cancer gene (BRCA) assessment is recommended for women who have family members with BRCA-related cancers. BRCA-related cancers include:  Breast.  Ovarian.  Tubal.  Peritoneal cancers.  Results of the assessment will determine the need for genetic counseling and BRCA1 and BRCA2 testing. Cervical Cancer  Your health care provider may recommend that you be screened regularly for cancer of the pelvic organs (ovaries, uterus, and vagina). This screening involves a pelvic examination, including checking for microscopic changes to the surface of your cervix (Pap test). You may be encouraged to have this screening done every 3 years, beginning at age 58.  For women ages 45-65, health care providers may recommend pelvic exams and Pap testing every 3 years, or they may recommend the Pap and pelvic exam, combined with testing for human papilloma virus (HPV), every 5 years. Some types of HPV increase your risk of cervical cancer. Testing for HPV may also be done on women of any age with unclear Pap test results.  Other health care providers may not recommend any screening for nonpregnant women who are considered low risk for pelvic cancer and who do not have symptoms. Ask your health care provider if a screening pelvic exam is right for you.  If you have had past treatment for cervical cancer or a condition that could lead to cancer, you need Pap tests and screening for cancer for at least 20 years after your treatment. If Pap tests have been discontinued, your risk factors (such as having a new sexual partner) need to be reassessed to determine if screening should resume. Some women have medical problems that increase the chance of getting cervical cancer. In these cases, your health care provider may recommend more frequent screening and Pap tests. Colorectal Cancer  This type of cancer can be detected and often prevented.  Routine colorectal cancer screening usually begins at 74 years  of age and continues through 74 years of age.  Your health care provider may recommend screening at an earlier age if you have risk factors for colon cancer.  Your health care provider may also recommend using home test kits to check for hidden blood in the stool.  A small camera at the end of a tube can be used to examine your colon directly (sigmoidoscopy or colonoscopy). This is done to check for the earliest forms of colorectal cancer.  Routine screening usually begins at age 20.  Direct examination of the colon should be repeated every 5-10 years through 74 years of age. However, you may need to be screened more often if early forms of precancerous polyps or small growths are found. Skin Cancer  Check your skin from head to toe regularly.  Tell your health care provider about any new moles or changes in moles, especially if there is a change in a mole's shape or color.  Also tell your health care provider if you have a mole that is larger than the size  of a pencil eraser.  Always use sunscreen. Apply sunscreen liberally and repeatedly throughout the day.  Protect yourself by wearing long sleeves, pants, a wide-brimmed hat, and sunglasses whenever you are outside. Heart disease, diabetes, and high blood pressure  High blood pressure causes heart disease and increases the risk of stroke. High blood pressure is more likely to develop in:  People who have blood pressure in the high end of the normal range (130-139/85-89 mm Hg).  People who are overweight or obese.  People who are African American.  If you are 73-74 years of age, have your blood pressure checked every 3-5 years. If you are 65 years of age or older, have your blood pressure checked every year. You should have your blood pressure measured twice-once when you are at a hospital or clinic, and once when you are not at a hospital or clinic. Record the average of the two measurements. To check your blood pressure when you  are not at a hospital or clinic, you can use:  An automated blood pressure machine at a pharmacy.  A home blood pressure monitor.  If you are between 56 years and 92 years old, ask your health care provider if you should take aspirin to prevent strokes.  Have regular diabetes screenings. This involves taking a blood sample to check your fasting blood sugar level.  If you are at a normal weight and have a low risk for diabetes, have this test once every three years after 74 years of age.  If you are overweight and have a high risk for diabetes, consider being tested at a younger age or more often. Preventing infection Hepatitis B  If you have a higher risk for hepatitis B, you should be screened for this virus. You are considered at high risk for hepatitis B if:  You were born in a country where hepatitis B is common. Ask your health care provider which countries are considered high risk.  Your parents were born in a high-risk country, and you have not been immunized against hepatitis B (hepatitis B vaccine).  You have HIV or AIDS.  You use needles to inject street drugs.  You live with someone who has hepatitis B.  You have had sex with someone who has hepatitis B.  You get hemodialysis treatment.  You take certain medicines for conditions, including cancer, organ transplantation, and autoimmune conditions. Hepatitis C  Blood testing is recommended for:  Everyone born from [birth year] through 1964.  Anyone with known risk factors for hepatitis C. Sexually transmitted infections (STIs)  You should be screened for sexually transmitted infections (STIs) including gonorrhea and chlamydia if:  You are sexually active and are younger than 74 years of age.  You are older than 74 years of age and your health care provider tells you that you are at risk for this type of infection.  Your sexual activity has changed since you were last screened and you are at an increased risk for  chlamydia or gonorrhea. Ask your health care provider if you are at risk.  If you do not have HIV, but are at risk, it may be recommended that you take a prescription medicine daily to prevent HIV infection. This is called pre-exposure prophylaxis (PrEP). You are considered at risk if:  You are sexually active and do not regularly use condoms or know the HIV status of your partner(s).  You take drugs by injection.  You are sexually active with a partner who has HIV. Talk  with your health care provider about whether you are at high risk of being infected with HIV. If you choose to begin PrEP, you should first be tested for HIV. You should then be tested every 3 months for as long as you are taking PrEP. Pregnancy  If you are premenopausal and you may become pregnant, ask your health care provider about preconception counseling.  If you may become pregnant, take 400 to 800 micrograms (mcg) of folic acid every day.  If you want to prevent pregnancy, talk to your health care provider about birth control (contraception). Osteoporosis and menopause  Osteoporosis is a disease in which the bones lose minerals and strength with aging. This can result in serious bone fractures. Your risk for osteoporosis can be identified using a bone density scan.  If you are 31 years of age or older, or if you are at risk for osteoporosis and fractures, ask your health care provider if you should be screened.  Ask your health care provider whether you should take a calcium or vitamin D supplement to lower your risk for osteoporosis.  Menopause may have certain physical symptoms and risks.  Hormone replacement therapy may reduce some of these symptoms and risks. Talk to your health care provider about whether hormone replacement therapy is right for you. Follow these instructions at home:  Schedule regular health, dental, and eye exams.  Stay current with your immunizations.  Do not use any tobacco products  including cigarettes, chewing tobacco, or electronic cigarettes.  If you are pregnant, do not drink alcohol.  If you are breastfeeding, limit how much and how often you drink alcohol.  Limit alcohol intake to no more than 1 drink per day for nonpregnant women. One drink equals 12 ounces of beer, 5 ounces of wine, or 1 ounces of hard liquor.  Do not use street drugs.  Do not share needles.  Ask your health care provider for help if you need support or information about quitting drugs.  Tell your health care provider if you often feel depressed.  Tell your health care provider if you have ever been abused or do not feel safe at home. This information is not intended to replace advice given to you by your health care provider. Make sure you discuss any questions you have with your health care provider. Document Released: 03/28/2011 Document Revised: 02/18/2016 Document Reviewed: 06/16/2015  2017 [employer]

## 2016-08-15 NOTE — Progress Notes (Signed)
Pre visit review using our clinic review tool, if applicable. No additional management support is needed unless otherwise documented below in the visit note. 

## 2016-08-15 NOTE — Progress Notes (Addendum)
BP 134/70   Pulse 72   Temp 97.6 F (36.4 C) (Oral)   Wt 152 lb 8 oz (69.2 kg)   BMI 26.38 kg/m    CC: CPE Subjective:    Patient ID: Cassandra Benson, female    DOB: 1940-10-13, 75 y.o.   MRN: [id number]  HPI: Cassandra Benson is a 75 y.o. female presenting on 08/15/2016 for Annual Exam   Saw Katha Cabal last week for medicare wellness visit. Ongoing R shoulder pain worsening over last 3-4 months. Received 73mo handicap placard for inability to walk 200 ft and limitations due to arthritis. Does use a cane.   Planning on going to work part time with her town [employer], [employer].  Planning trip to [place] in December.   HLD - crestor causes leg myalgias.  DM - on insulin pump. Would like referral to establish with Dr Buddy Duty new endocrinologist.  Diabetic Foot Exam - Simple   Simple Foot Form Diabetic Foot exam was performed with the following findings:  Yes 08/15/2016 10:33 AM  Visual Inspection No deformities, no ulcerations, no other skin breakdown bilaterally:  Yes Sensation Testing Intact to touch and monofilament testing bilaterally:  Yes Pulse Check Posterior Tibialis and Dorsalis pulse intact bilaterally:  Yes Comments      R shoulder ache and muscle pain. Denies inciting trauma/injury. Worse with raising arm above head. No neck pain. Denies paresthesias of arms.   Hip is doing better, never went through PT.    Preventative: Colon cancer screening - normal 09/2012 per prior records, good for 10 yrs [facility]) Breast cancer screening - mammo/US WNL 10/2015. Does self breast exams at home.  Well woman exam - last 27-Dec-2011 s/p abd hysterectomy, ovaries remain.  DEXA scan - normal per patient 04/2012 Flu shot - ALLERGIC Tdap 07/2012.  Pneumovax December 26, 2012, prevnar Dec 26, 2013.  Shingles shot - declines. Has had shingles.  Advanced directive discussion - has at home. HCPOA is son Catalina [place] and [place]). Will bring me copy. Seat belt use discussed Sunscreen use discussed, no changing moles on skin Non  smoker Alcohol - none  Lives in [place] now. Recently moved from [place]. Husband deceased mid 2015-12-27. No pets. 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 Interim medical history since our last visit reviewed. Allergies and medications reviewed and updated. Current Outpatient Prescriptions on File Prior to Visit  Medication Sig  . aspirin EC 81 MG tablet Take 81 mg by mouth daily.  . BD ULTRA-FINE LANCETS lancets Use as instructed upto 6 times daily  . Cholecalciferol 50000 UNITS TABS Take 25,000 mg by mouth every 14 (fourteen) days. Take one tablet by mouth every 14 days (2 weeks)  . Cyanocobalamin (B-12 SL) Place 1 drop under the tongue daily.   Marland Kitchen ibuprofen (ADVIL,MOTRIN) 600 MG tablet Take 1 tablet (600 mg total) by mouth every 8 (eight) hours as needed.  . insulin aspart (NOVOLOG) 100 UNIT/ML injection Use in an insulin pump upto 50 units per day (Patient taking differently: Inject 23.5-27 Units into the skin. Use in an insulin pump upto 50 units per day)  . Lecithin 400 MG CAPS Take 1 capsule by mouth daily.  Marland Kitchen LEVOXYL 75 MCG tablet TAKE 1 TABLET 6 DAYS A  WEEK, AND 1.5 TABLETS BY MOUTH ONE DAY A WEEK.  Marland Kitchen lisinopril (PRINIVIL,ZESTRIL) 10 MG tablet Take 1 tablet (10 mg total) by mouth daily.  . metFORMIN (GLUCOPHAGE-XR) 500 MG 24 hr tablet Take 1 tablet (500 mg total) by mouth 3 (three) times daily.  . rosuvastatin (CRESTOR) 5 MG tablet Take 1 tablet (5 mg total) by mouth daily. (Patient taking differently: Take 5 mg by mouth 2 (two) times a week. )   No current facility-administered medications on file prior to visit.     Review of Systems  Constitutional: Negative for  activity change, appetite change, chills, fatigue, fever and unexpected weight change.  HENT: Negative for hearing loss.   Eyes: Negative for visual disturbance.  Respiratory: Positive for chest tightness (with exertion) and shortness of breath. Negative for cough and wheezing.   Cardiovascular: Negative for chest pain, palpitations and leg swelling.  Gastrointestinal: Positive for diarrhea (diarrhea related). Negative for abdominal distention, abdominal pain, blood in stool, constipation, nausea and vomiting.  Genitourinary: Negative for difficulty urinating and hematuria.  Musculoskeletal: Negative for arthralgias, myalgias and neck pain.  Skin: Negative for rash.  Neurological: Negative for dizziness, seizures, syncope and headaches.  Hematological: Negative for adenopathy. Does not bruise/bleed easily.  Psychiatric/Behavioral: Negative for dysphoric mood. The patient is not nervous/anxious.    Per HPI unless specifically indicated in ROS section     Objective:    BP 134/70   Pulse 72   Temp 97.6 F (36.4 C) (Oral)   Wt 152 lb 8 oz (69.2 kg)   BMI 26.38 kg/m   Wt Readings from Last 3 Encounters:  08/15/16 152 lb 8 oz (69.2 kg)  08/09/16 150 lb 12 oz (68.4 kg)  05/12/16 153 lb 8 oz (69.6 kg)    Physical Exam  Constitutional: She is oriented to person, place, and time. She appears well-developed and well-nourished. No distress.  HENT:  Head: Normocephalic and atraumatic.  Right Ear: Hearing, tympanic membrane, external ear and ear canal normal.  Left Ear: Hearing, tympanic membrane, external ear and ear canal normal.  Nose: Nose normal.  Mouth/Throat: Uvula is midline, oropharynx is clear and moist and mucous membranes are normal. No oropharyngeal exudate, posterior oropharyngeal edema or posterior oropharyngeal erythema.  Eyes: Conjunctivae and EOM are normal. Pupils are equal, round, and reactive to light. No scleral icterus.  Neck: Normal range of motion. Neck supple.  Carotid bruit is present (L). No thyromegaly present.  Cardiovascular: Normal rate, regular rhythm, normal heart sounds and intact distal pulses.   No murmur heard. Pulses:      Radial pulses are 2+ on the right side, and 2+ on the left side.  Pulmonary/Chest: Effort normal and breath sounds normal. No respiratory distress. She has no wheezes. She has no rales.  Abdominal: Soft. Bowel sounds are normal. She exhibits no distension and no mass. There is no tenderness. There is no rebound and no guarding.  Musculoskeletal: Normal range of motion. She exhibits no edema.  L shoulder WNL R Shoulder exam: No deformity of shoulders on inspection. + pain with palpation of posterior shoulder bursa Intact active ROM in abduction but limited passive ROM due to pain. No pain or weakness with testing SITS in ext/int rotation. No pain with empty can sign. Neg Yerguson, Speed test. Tender with hawking test.  No pain with crossover test. + pain with rotation of humeral head in [facility] joint.   Lymphadenopathy:    She has no cervical adenopathy.  Neurological: She is alert  and oriented to person, place, and time.  CN grossly intact, station and gait intact  Skin: Skin is warm and dry. No rash noted.  Psychiatric: She has a normal mood and affect. Her behavior is normal. Judgment and thought content normal.  Nursing note and vitals reviewed.  Results for orders placed or performed in visit on 08/09/16  Lipid panel  Result Value Ref Range   Cholesterol 245 (H) 0 - 200 mg/dL   Triglycerides 258.0 (H) 0.0 - 149.0 mg/dL   HDL 41.80 >39.00 mg/dL   VLDL 51.6 (H) 0.0 - 40.0 mg/dL   Total CHOL/HDL Ratio 6    NonHDL 203.38   Hemoglobin A1c  Result Value Ref Range   Hgb A1c MFr Bld 7.7 (H) 4.6 - 6.5 %  TSH  Result Value Ref Range   TSH 0.42 0.35 - 4.50 uIU/mL  Basic metabolic panel  Result Value Ref Range   Sodium 139 135 - 145 mEq/L   Potassium 4.4 3.5 - 5.1 mEq/L   Chloride 103 96 - 112 mEq/L   CO2 29  19 - 32 mEq/L   Glucose, Bld 113 (H) 70 - 99 mg/dL   BUN 13 6 - 23 mg/dL   Creatinine, Ser 0.91 0.40 - 1.20 mg/dL   Calcium 9.9 8.4 - 10.5 mg/dL   GFR 64.21 >60.00 mL/min  VITAMIN D 25 Hydroxy (Vit-D Deficiency, Fractures)  Result Value Ref Range   VITD 56.68 30.00 - 100.00 ng/mL  LDL cholesterol, direct  Result Value Ref Range   Direct LDL 177.0 mg/dL   EKG - NSR rate 60s, normal axis, intervals, no acute ST/T changes, V2 not read.    Assessment & Plan:   Problem List Items Addressed This Visit    Advanced care planning/counseling discussion    Advanced directive discussion - has at home. HCPOA is son Catalina [place] and [place]). Will bring me copy.      Carotid stenosis, asymptomatic    Update [place]      Relevant Orders   VAS US CAROTID   Diabetes type 2, controlled ([place])    Refer to local endo to establish care. Pt requests Dr Buddy Duty. Foot exam today.      Relevant Orders   Ambulatory referral to Endocrinology   Essential hypertension    Chronic, stable. Continue current regimen.      Family history of premature CAD    Update EKG. rec return to Dr Rockey Situ for f/u.       Grief counseling    50mo s/p husband's death from metastatic colon cancer.  Overall doing ok, planning to return to work part time.      Health maintenance examination - Primary    Preventative protocols reviewed and updated unless pt declined. Discussed healthy diet and lifestyle.       Hyperlipidemia    Chronic. Deteriorated. Pt endorses she will be more regular with crestor 5mg  QOD, will work towards low chol diet.       Hypothyroidism    Chronic, stable. Continue current regimen.      Right shoulder pain    Anticipate shoulder bursitis as well as GH joint arthritis based on exam today. Concern for developing AC in DM history. rec supportive care - limited NSAID PRN, heating pad. Provided with exercises and resistance band from [facility] pt advisor. Update if no improvement for xray.           Follow up  plan: Return in about 1 year (around 08/15/2017) for follow  up visit.  [name], MD

## 2016-08-15 NOTE — Assessment & Plan Note (Signed)
Chronic. Deteriorated. Pt endorses she will be more regular with crestor 5mg  QOD, will work towards low chol diet.

## 2016-08-15 NOTE — Assessment & Plan Note (Signed)
Preventative protocols reviewed and updated unless pt declined. Discussed healthy diet and lifestyle.  

## 2016-08-22 ENCOUNTER — Other Ambulatory Visit: Payer: Self-pay | Admitting: Family Medicine

## 2016-08-22 NOTE — Telephone Encounter (Signed)
Refill sent per [facility] refill protocol/SLS  

## 2016-09-13 ENCOUNTER — Ambulatory Visit: Payer: Medicare PPO | Admitting: Internal Medicine

## 2016-09-14 ENCOUNTER — Ambulatory Visit: Payer: Medicare PPO

## 2016-09-14 DIAGNOSIS — I6523 Occlusion and stenosis of bilateral carotid arteries: Secondary | ICD-10-CM

## 2016-10-02 ENCOUNTER — Encounter: Payer: Self-pay | Admitting: Internal Medicine

## 2016-10-02 LAB — POCT GLUCOSE (DEVICE FOR HOME USE)

## 2016-10-03 ENCOUNTER — Ambulatory Visit (INDEPENDENT_AMBULATORY_CARE_PROVIDER_SITE_OTHER): Payer: Medicare PPO | Admitting: Internal Medicine

## 2016-10-03 ENCOUNTER — Encounter: Payer: Self-pay | Admitting: Internal Medicine

## 2016-10-03 VITALS — BP 118/72 | HR 99 | Ht 64.0 in | Wt 153.0 lb

## 2016-10-03 DIAGNOSIS — E109 Type 1 diabetes mellitus without complications: Secondary | ICD-10-CM | POA: Diagnosis not present

## 2016-10-03 DIAGNOSIS — E139 Other specified diabetes mellitus without complications: Secondary | ICD-10-CM | POA: Insufficient documentation

## 2016-10-03 DIAGNOSIS — E13319 Other specified diabetes mellitus with unspecified diabetic retinopathy without macular edema: Secondary | ICD-10-CM | POA: Insufficient documentation

## 2016-10-03 MED ORDER — INSULIN ASPART 100 UNIT/ML ~~LOC~~ SOLN: SUBCUTANEOUS | 6 refills | Status: DC

## 2016-10-03 MED ORDER — GLUCAGON (RDNA) 1 MG IJ KIT: 1.0000 mg | PACK | Freq: Once | INTRAMUSCULAR | 12 refills | Status: DC | PRN

## 2016-10-03 NOTE — Progress Notes (Signed)
Patient ID: Cassandra Benson, female   DOB: 03-May-1941, 76 y.o.   MRN: [id number]   HPI: Cassandra Benson is a 76 y.o.-year-old female, referred by her PCP, Dr. Danise Benson, for management of LADA, uncontrolled, without complications. She previously saw endocrinology at [facility] (Dr. Janese Benson - last note reviewed). She also saw Dr Cassandra Benson in the past.  Patient has been diagnosed with diabetes in 1998 (at 76 y/o); she started on insulin at dx, insulin pump since ~ 2008.  Last hemoglobin A1c was: Lab Results  Component Value Date   HGBA1C 7.7 (H) 08/09/2016   HGBA1C 7.2 (H) 05/26/2015   HGBA1C 7.8 (H) 10/23/2014   Pt is on an insulin pump: Medtronic 723 - in Apr 07, 2016, without CGM, uses Novolog in the pump. [facility] supplies.  She is also on Glucophage ER 500 mg 3x a day. She cannot take the generic metformin ER because of severe diarrhea.  Pump settings: - basal rates: 12 am: 0.800 units/h 3 am: 0.875 9:30 am: 0.100 5 pm: 1.500 6:30 pm: 1.300 11:30 pm: 1.800 - ICR:   12 am: 10 6 pm: 8 - target: 130-130 except 6-8 am and 6 pm-12 am: 100-100  - ISF: 35 - Insulin on Board: 4h - bolus wizard: on TDD from basal insulin: 81% (18.2 units) TDD from bolus insulin: 19% (4.3 units) - extended bolusing: not using - changes infusion site: q7 days - Meter: Bayer Contour  Pt checks her sugars 1.6x a day and they are - 106 +/- 24 -Lower in the last few months, after her husband's death:  - am: 86-109 - 2h after b'fast: 88-119 - before lunch: 95-116 - 2h after lunch: 107 - before dinner: 97, 104, 112 - 2h after dinner: n/c - bedtime: n/c - nighttime:  43, 55 - midnight, 1-2 months ago, more recently: 204 x1 No lows. Lowest sugar was 43; she has hypoglycemia awareness at 80. No previous hypoglycemia admission. Does not have a glucagon kit at home. Highest sugar was 204. No previous DKA admissions.    Pt's meals are: - Breakfast: protein drink + almond milk - Lunch: PB jelly sandwich or sandwich with ham  or cream cheese sandwich - Dinner: salads  - Snacks: pretzels; pork tenderloin + veggies; chicken + tenderloin  - no CKD, last BUN/creatinine:  Lab Results  Component Value Date   BUN 13 08/09/2016   BUN 9 05/26/2015   CREATININE 0.91 08/09/2016   CREATININE 0.89 01/11/2016  On Lisinopril. - last set of lipids: Lab Results  Component Value Date   CHOL 245 (H) 08/09/2016   HDL 41.80 08/09/2016   LDLCALC 160 01/11/2016   LDLDIRECT 177.0 08/09/2016   TRIG 258.0 (H) 08/09/2016   CHOLHDL 6 08/09/2016  On Crestor 5 >> mm cramps. On CoQ10. - last eye exam was in Spring 2017. No DR.  - no numbness and tingling in her feet. Pain in her L big toe - occasionally. Foot exam normal by PCP: 08/15/2016.  She also has hypothyroidism: - 2/2 Hashimoto's Thyroiditis - FH of Graves ds in mother. - on Levoxyl  DAW 75 mcg daily 1 tab 6/7 days and 1.5 tabs 1/7 days  Last TSH: Lab Results  Component Value Date   TSH 0.42 08/09/2016   Pt has FH of DM in 2 sisters and 1 brother.  She has SLE, also.  Her husband passed away in 2016-04-07.  ROS: Constitutional: no weight gain/loss, no fatigue, no subjective hyperthermia/hypothermia Eyes: no blurry vision, no xerophthalmia ENT:  no sore throat, no nodules palpated in throat, no dysphagia/odynophagia, no hoarseness Cardiovascular: no CP/+ SOB/+ palpitations/no leg swelling Respiratory: no cough/+ SOB Gastrointestinal: no N/V/D/C Musculoskeletal:+ Both: muscle/joint aches Skin: no rashes, + acne face, + hair loss, + increased in hair on chin Neurological: no tremors/numbness/tingling/dizziness, + rarely headaches Psychiatric: no depression/anxiety  Past Medical History:  Diagnosis Date  . Allergy   . Arthritis   . Carotid stenosis, asymptomatic 06/19/2015   9-24% RICA 46-28% LICA rpt 1 yr (02/3816)   . Diabetes mellitus without complication ([place])   . Fibromyalgia    prior PCP  . GERD (gastroesophageal reflux disease)    prior PCP  .  Glaucoma    Narrow angle  . History of blood clots    DVT, in 20s, none since  . History of chicken pox   . History of diverticulitis   . History of pericarditis    with hospitalization  . History of pneumonia 2014  . History of shingles   . History of UTI   . Hyperlipidemia   . Hypothyroidism   . Lupus (systemic lupus erythematosus) ([place])   . Shoulder pain left   h/o RTC tendonitis and adhesive capsulitis  . Sleep apnea    prior PCP  . Vitamin D deficiency    prior PCP   Past Surgical History:  Procedure Laterality Date  . ABDOMINAL HYSTERECTOMY  1978   fibroids and menorrhagia, ovaries remain  . [facility] normal per patient  . PARTIAL HIP ARTHROPLASTY  2013   Right hip replacement  . TONSILLECTOMY AND ADENOIDECTOMY    . VAGINAL DELIVERY     x2, no complications   Social History   Social History  . Widowed   . Number of children: 2   Occupational History  Retired    Social History Main Topics  . Smoking status: Never Smoker  . Smokeless tobacco: Never Used  . Alcohol use No  . Drug use: No   Social History Narrative   Lives in [place] now. Recently moved from [place].   No pets.   Mother of Cassandra Benson.   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 aspirin EC 81 MG tablet Take 81 mg by mouth daily.    . BD ULTRA-FINE LANCETS lancets Use as instructed upto 6 times daily 600 each 2  . Cholecalciferol 50000 UNITS TABS Take 25,000 mg by mouth every 14 (fourteen) days. Take one tablet by mouth every 14 days (2 weeks)    . Cyanocobalamin (B-12 SL) Place 1 drop under the tongue daily.     Marland Kitchen ibuprofen (ADVIL,MOTRIN) 600 MG tablet Take  1 tablet (600 mg total) by mouth every 8 (eight) hours as needed. 30 tablet 0  . Lecithin 400 MG CAPS Take 1 capsule by mouth once a week.     Marland Kitchen LEVOXYL 75 MCG tablet TAKE 1 TABLET 6 DAYS A WEEK, AND 1.5 TABLETS BY MOUTH ONE DAY A WEEK. 32 tablet 3  . lisinopril (PRINIVIL,ZESTRIL) 10 MG tablet take 1 tablet by mouth once daily 30 tablet 3  . metFORMIN (GLUCOPHAGE-XR) 500 MG 24 hr tablet Take 1 tablet (500 mg  total) by mouth 3 (three) times daily. 270 tablet 1  . rosuvastatin (CRESTOR) 5 MG tablet Take 1 tablet (5 mg total) by mouth daily. (Patient taking differently: Take 5 mg by mouth 2 (two) times a week. ) 30 tablet 11   No current facility-administered medications on file prior to visit.    Allergies  Allergen Reactions  . Iodinated Diagnostic Agents Other (See Comments)    Itching (severe) and chest tightness  . Codeine Nausea Only  . Influenza Vaccines     Muscle weakness  . Valsartan     Allergy to generic only Other reaction(s): Cough "Hacking" cough  . Erythromycin Rash and Swelling  . Penicillins Rash and Swelling  . Sulfa Antibiotics Rash   Family History  Problem Relation Age of Onset  . CAD Mother 52    MI, aortic valve issues  . COPD Mother   . Lupus Mother   . CAD Father 68    CABG x2, aortic valve replacement  . Stroke Sister   . Alcohol abuse Brother   . CAD Brother 45    MI  . Stroke Brother   . Seizures Son   . COPD Brother   . CAD Brother 26    stent  . Diabetes Brother   . Depression Grandchild   . Cancer Maternal Aunt     breast  . Breast cancer Maternal Aunt   . Diabetes Sister     deceased  . CAD Sister 43    stents  . Breast cancer Maternal Aunt    PE: BP 118/72 (BP Location: Left Arm, Patient Position: Sitting)   Pulse 99   Ht '5\' 4"'  (1.626 m)   Wt 153 lb (69.4 kg)   SpO2 96%   BMI 26.26 kg/m  Wt Readings from Last 3 Encounters:  10/03/16 153 lb (69.4 kg)  08/15/16 152 lb 8 oz (69.2 kg)  08/09/16 150 lb 12 oz (68.4 kg)    Constitutional: slightly overweight, in NAD Eyes: PERRLA, EOMI, no exophthalmos ENT: moist mucous membranes, no thyromegaly, no cervical lymphadenopathy Cardiovascular: RRR, No MRG Respiratory: CTA B Gastrointestinal: abdomen soft, NT, ND, BS+ Musculoskeletal: no deformities, strength intact in all 4 Skin: moist, warm, no rashes, few acne spots on face Neurological: no tremor with outstretched hands, DTR normal in all 4  ASSESSMENT: 1. DM1, uncontrolled, without complications  2. Hirsutism and acne  PLAN:  1. Patient with long-standing, uncontrolled LADA, on insulin pump therapy + Metformin. In the last 2 weeks, per her pump downloads, her sugars are all at goal. This is despite checking sugars and bolusing with meals infrequently, usu. ~ 1x a day. I believe that she still has significant insulin production so she can get by with bolusing so little. She does mention that since her husband passed away, her sugars actually improved since she is not eating much. - She has an unusual basal rates regimen, with almost no insulin between 9:30 AM and 5 PM and the high basal insulin rate later at night. Because she had low blood sugars in the past around 12 AM, I will decrease her 11:30 PM basal rate to 1.3. As sugars are great during the day, I have no reason to increase her 9:30 AM basal rate. - I did advise her to change her pump site every 3 days rather than every 7-10 days and I explained why this is important: Scar tissue formation, erratic insulin release from the inflamed site with subsequent hypo-or hyperglycemia. - We  discussed about changes to his insulin regimen, as follows:  Patient Instructions  Please change the pump settings as follows: - basal rates: 12 am: 0.800 units/h 3 am: 0.875 9:30 am: 0.100 5 pm: 1.500 6:30 pm: 1.300 11:30 pm: 1.800 >> 1.300 - ICR:   12 am: 10 6 pm: 8 - target: 130-130 except 6-8 am and 6 pm-12 am: 100-100  - ISF: 35 - Insulin on Board: 4h -  bolus wizard: on  Change the pump site every 3 days (on Sundays and Wednesday).  Please come back for a follow-up appointment in 3 months.  - Strongly advised her to start checking sugars at different times of the day - check at least 3 times a day, rotating checks - given foot care handout and explained the principles  - given instructions for hypoglycemia management "15-15 rule"  - advised for yearly eye exams >> she is UTD - sent glucagon kit Rx to pharmacy - advised to get ketone strips - advised to always have Glu tablets with her - advised for a Med-alert bracelet mentioning "type 1 diabetes mellitus". - given instruction Re: exercising and driving in DM1 (pt instructions) - no signs of other autoimmune disorders - Return to clinic in 1 mo with sugar log   2. Hirsutism and acne - Exacerbated in the latest years - She mentions that she had a higher testosterone level in the past. Reviewing the chart, her total testosterone was elevated, however, the free testosterone was normal. She also had a normal dexamethasone suppresion test: Component     Latest Ref Rng & Units 09/02/2014 09/08/2014  Testosterone     3 - 41 ng/dL 59 (H)   Testosterone Free     0.0 - 4.2 pg/mL 2.1   FSH     mIU/mL  68.6  LH     mIU/mL  43.4  DHEA-SO4     7 - 177 ug/dL 49   Cortisol, Plasma     ug/dL  1.3 (Dexamethasone suppression test)   Estradiol     pg/mL  24.0   - time spent with the patient: 1h , of which >50% was spent in obtaining information about herdisease, reviewing previous labs, office visit notes, and insulin pump downloads, counseling pt about her condition (please see the discussed topics above), and developing a plan to prevent further hypoglycemia and hyperglycemia. Pt had a number of questions which I addressed.  Philemon Kingdom, MD PhD [facility] Endocrinology

## 2016-10-03 NOTE — Patient Instructions (Addendum)
Please change the pump settings as follows: - basal rates: 12 am: 0.800 units/h 3 am: 0.875 9:30 am: 0.100 5 pm: 1.500 6:30 pm: 1.300 11:30 pm: 1.800 >> 1.300 - ICR:   12 am: 10 6 pm: 8 - target: 130-130 except 6-8 am and 6 pm-12 am: 100-100  - ISF: 35 - Insulin on Board: 4h - bolus wizard: on  Change the pump site every 3 days (on Sundays and Wednesday).  Please come back for a follow-up appointment in 3 months.  Basic Rules for Patients with Type I Diabetes Mellitus  1. The American Diabetes Association (ADA) recommended targets: - fasting sugar <130 - after meal sugar <180 - HbA1C <7%  2. Engage in ?150 min moderate exercise per week  3. Make sure you have ?8h of sleep every night as this helps both blood sugars and your weight.  4. Always keep a sugar log (not only record in your meter) and bring it to all appointments with [place].  5. If you are on a pump, know how to access the settings and to modify the parameters.  6.  Remember, you can always call the number on the back of the pump for emergencies related to the pump.  7. "15-15 rule" for hypoglycemia: if sugars are low, take 15 g of carbs** ("fast sugar" - e.g. 4 glucose tablets, 4 oz orange juice), wait 15 min, then check sugars again. If still <80, repeat. Continue  until your sugars >80, then eat a normal meal.   8. Teach family members and coworkers to inject glucagon. Have a glucagon set at home and one at work. They should call 911 after using the set.  9. If you are on a pump, set "insulin on board" time for 5 hours (if your sugars tend to be higher, can use 4 hours).   10. If you are on a pump, use the "dual wave bolus" setting for high fat foods (e.g. pizza). Start with a setting of 50%-50% (50% instant bolus and 50% prolonged bolus over 3h, for e.g.).    11. If you are on a pump, make sure the basal daily insulin dose is approximately equal (not larger) to the daily insulin you get from boluses, otherwise  you are at risk for hypoglycemia.  12. Check sugar before driving. If <100, correct, and only start driving if sugars rise ?100. Check sugar every hour when on a long drive.  13. Check sugar before exercising. If <100, correct, and only start exercising if sugars rise ?100. Check sugar every hour when on a long exercise routine and 1h after you finished exercising.   If >250, check urine for ketones. If you have moderate-large ketones in urine, do not start exercise. Hydrate yourself with clear liquids and correct the high sugar. Recheck sugars and ketones before attempting to exercise.  Be aware that you might need less insulin when exercising.  *intense, short, exercise bursts can increase your sugars, but  *less intense, longer (>1h), exercise routines can decrease your sugars.  If you are on a pump, you might need to decrease your basal rate by 10% or more (or even disconnect your pump) while you exercise to prevent low sugars. Do not disconnect your pump by more than 3 hours at a time! You also might need to decrease your insulin bolus for the meal prior to your exercise time by 20% or more.  14. Make sure you have a MedAlert bracelet or pendant mentioning "Type I Diabetes Mellitus".  If you have a prior episode of severe hypoglycemia or hypoglycemia unawareness, it should also mention this.  15. Please do not walk barefoot. Inspect your feet for sores/cuts and let us know if you have them.  16. Please call [facility] with any questions and concerns [phone]).   **E.g. of "fast carbs": ? first choice (15 g):  1 tube glucose gel, GlucoPouch 15, 2 oz glucose liquid ? second choice (15-16 g):  3 or 4 glucose tablets (best taken  with water), 15 Dextrose Bits chewable ? third choice (15-20 g):   cup fruit juice,  cup regular soda, 1 cup skim milk,  1 cup sports drink ? fourth choice (15-20 g):  1 small tube [place] gel (not frosting), 2 tbsp raisins, 1 tbsp  table sugar,  candy, jelly beans, gum drops - check package for carb amount   (adapted from: Lenice Pressman. "Insulin therapy and hypoglycemia" Endocrinol Metab Clin N Am 2012, 41: 57-87)

## 2016-10-20 ENCOUNTER — Telehealth: Payer: Self-pay | Admitting: *Deleted

## 2016-10-20 ENCOUNTER — Encounter: Payer: Self-pay | Admitting: Cardiovascular Disease

## 2016-10-20 ENCOUNTER — Ambulatory Visit (INDEPENDENT_AMBULATORY_CARE_PROVIDER_SITE_OTHER): Payer: PPO | Admitting: Cardiovascular Disease

## 2016-10-20 VITALS — BP 110/64 | HR 84 | Ht 64.0 in | Wt 151.0 lb

## 2016-10-20 DIAGNOSIS — F4323 Adjustment disorder with mixed anxiety and depressed mood: Secondary | ICD-10-CM

## 2016-10-20 DIAGNOSIS — I10 Essential (primary) hypertension: Secondary | ICD-10-CM | POA: Diagnosis not present

## 2016-10-20 DIAGNOSIS — I6523 Occlusion and stenosis of bilateral carotid arteries: Secondary | ICD-10-CM

## 2016-10-20 DIAGNOSIS — I499 Cardiac arrhythmia, unspecified: Secondary | ICD-10-CM | POA: Diagnosis not present

## 2016-10-20 DIAGNOSIS — E109 Type 1 diabetes mellitus without complications: Secondary | ICD-10-CM

## 2016-10-20 DIAGNOSIS — E7800 Pure hypercholesterolemia, unspecified: Secondary | ICD-10-CM

## 2016-10-20 DIAGNOSIS — E139 Other specified diabetes mellitus without complications: Secondary | ICD-10-CM

## 2016-10-20 MED ORDER — ROSUVASTATIN CALCIUM 5 MG PO TABS: 5.0000 mg | ORAL_TABLET | Freq: Every day | ORAL | 3 refills | Status: DC

## 2016-10-20 MED ORDER — EZETIMIBE 10 MG PO TABS: 10.0000 mg | ORAL_TABLET | Freq: Every day | ORAL | 11 refills | Status: DC

## 2016-10-20 NOTE — Progress Notes (Signed)
Cardiology Office Note  Date:  10/20/2016   ID:  Cassandra Benson, DOB 03-24-1941, MRN [id number]  PCP:  [name], MD   Chief Complaint  Patient presents with  . OTHER    OD 1 yr f/u c/o palpitations. Meds reviewed verbally with pt.    HPI:  [name] is a very pleasant 76 year old woman with 15 years of diabetes, on insulin, hyperlipidemia, family history of coronary artery disease, Moderate carotid arterial disease on the left estimated at 40-59% stenosis, hypertension, who presents for follow-up of prior episodes of chest pain, carotid arterial disease, hyperlipidemia LDL 177  In follow-up today, she had stressful January 08, 2016 Husband died in January 08, 2016, cancer Was diagnosed late spring, died several months later from metastatic disease, primary unclear  Stressful in general, lonely at nighttime She was unable to tolerate Crestor, had significant myalgias after only a short period of time. Has been trying to modify her diet, take over-the-counter vitamins for cholesterol Denies any chest pain or shortness of breath on exertion, no regular exercise program Reports that she is active at baseline Uses insulin pump  She did not get flu shot this year, reports that she could not walk after previous flu shot  Lab work reviewed on today's visit Total chol 245, LDL 177 HBA1C 7.6  Imaging reviewed with her in detail Carotid: 07/2016: 40 to 59% left carotid, velocity increase on the left from 08-Jan-2015 Similar disease seen in 2015-01-08, lower end of range  EKG on today's visit shows normal sinus rhythm with rate 84 bpm, no significant ST or T-wave changes Long discussion concerning her family history, she has not smoked, relatives/including mother with coronary disease did smoke  Other past medical history reviewed Previous episodes of chest pain,  woke up with some chest discomfort.  Went to the emergency room, cardiac enzymes negative 3,  chest CT with no PE.   She does report prior cardiac  catheterization in 08-Jan-1999 showing no significant disease CT scan done 04/2015 in the emergency room shows no coronary calcifications, minimal minimal descending aorta atherosclerosis   PMH:   has a past medical history of Allergy; Arthritis; Carotid stenosis, asymptomatic (06/19/2015); Diabetes mellitus without complication ([place]); Fibromyalgia; GERD (gastroesophageal reflux disease); Glaucoma; History of blood clots; History of chicken pox; History of diverticulitis; History of pericarditis; History of pneumonia Jan 07, 2013); History of shingles; History of UTI; Hyperlipidemia; Hypothyroidism; Lupus (systemic lupus erythematosus) ([place]); Shoulder pain (left); Sleep apnea; and Vitamin D deficiency.  PSH:    Past Surgical History:  Procedure Laterality Date  . ABDOMINAL HYSTERECTOMY  1978   fibroids and menorrhagia, ovaries remain  . [facility] normal per patient  . PARTIAL HIP ARTHROPLASTY  2012-01-08   Right hip replacement  . TONSILLECTOMY AND ADENOIDECTOMY    . VAGINAL DELIVERY     x2, no complications    Current Outpatient Prescriptions  Medication Sig Dispense Refill  . aspirin EC 81 MG tablet Take 81 mg by mouth daily.    . BD ULTRA-FINE LANCETS lancets Use as instructed upto 6 times daily 600 each 2  . Cholecalciferol 50000 UNITS TABS Take 25,000 mg by mouth every 14 (fourteen) days. Take one tablet by mouth every 14 days (2 weeks)    . Cyanocobalamin (B-12 SL) Place 1 drop under the tongue daily.     Marland Kitchen glucagon (GLUCAGON EMERGENCY) 1 MG injection Inject 1 mg into the muscle once as needed. 1 each 12  . ibuprofen (ADVIL,MOTRIN) 600 MG  tablet Take 1 tablet (600 mg total) by mouth every 8 (eight) hours as needed. 30 tablet 0  . insulin aspart (NOVOLOG) 100 UNIT/ML injection Use in an insulin pump upto 50 units per day 2 vial 6  . Lecithin 400 MG CAPS Take 1 capsule by mouth once a week.     Marland Kitchen LEVOXYL 75 MCG tablet TAKE 1 TABLET 6 DAYS A WEEK, AND 1.5 TABLETS BY  MOUTH ONE DAY A WEEK. 32 tablet 3  . lisinopril (PRINIVIL,ZESTRIL) 10 MG tablet take 1 tablet by mouth once daily 30 tablet 3  . metFORMIN (GLUCOPHAGE-XR) 500 MG 24 hr tablet Take 1 tablet (500 mg total) by mouth 3 (three) times daily. 270 tablet 1  . rosuvastatin (CRESTOR) 5 MG tablet Take 1 tablet (5 mg total) by mouth daily. 90 tablet 3  . ezetimibe (ZETIA) 10 MG tablet Take 1 tablet (10 mg total) by mouth daily. 30 tablet 11   No current facility-administered medications for this visit.      Allergies:   Iodinated diagnostic agents; Codeine; Influenza vaccines; Valsartan; Erythromycin; Penicillins; and Sulfa antibiotics   Social History:  The patient  reports that she has never smoked. She has never used smokeless tobacco. She reports that she does not drink alcohol or use drugs.   Family History:   family history includes Alcohol abuse in her brother; Breast cancer in her maternal aunt and maternal aunt; CAD (age of onset: 73) in her brother; CAD (age of onset: 57) in her brother; CAD (age of onset: 48) in her father and mother; CAD (age of onset: 36) in her sister; COPD in her brother and mother; Cancer in her maternal aunt; Depression in her grandchild; Diabetes in her brother and sister; Lupus in her mother; Seizures in her son; Stroke in her brother and sister.    Review of Systems: Review of Systems  Constitutional: Negative.   Respiratory: Negative.   Cardiovascular: Negative.   Gastrointestinal: Negative.   Musculoskeletal: Negative.   Neurological: Negative.   Psychiatric/Behavioral: Negative.   All other systems reviewed and are negative.    PHYSICAL EXAM: VS:  BP 110/64 (BP Location: Left Arm, Patient Position: Sitting, Cuff Size: Normal)   Pulse 84   Ht 5\' 4"  (1.626 m)   Wt 151 lb (68.5 kg)   BMI 25.92 kg/m  , BMI Body mass index is 25.92 kg/m. GEN: Well nourished, well developed, in no acute distress  HEENT: normal  Neck: no JVD, carotid bruits, or  masses Cardiac: RRR; no murmurs, rubs, or gallops,no edema  Respiratory:  clear to auscultation bilaterally, normal work of breathing GI: soft, nontender, nondistended, + BS MS: no deformity or atrophy  Skin: warm and dry, no rash Neuro:  Strength and sensation are intact Psych: euthymic mood, full affect    Recent Labs: 01/11/2016: ALT 21 08/09/2016: BUN 13; Creatinine, Ser 0.91; Potassium 4.4; Sodium 139; TSH 0.42    Lipid Panel Lab Results  Component Value Date   CHOL 245 (H) 08/09/2016   HDL 41.80 08/09/2016   LDLCALC 160 01/11/2016   TRIG 258.0 (H) 08/09/2016      Wt Readings from Last 3 Encounters:  10/20/16 151 lb (68.5 kg)  10/03/16 153 lb (69.4 kg)  08/15/16 152 lb 8 oz (69.2 kg)       ASSESSMENT AND PLAN:  Essential hypertension - Plan: EKG 12-Lead Blood pressure is well controlled on today's visit. No changes made to the medications.  LADA (latent autoimmune diabetes in adults),  managed as type 1 ([place]) Stressed the importance of low carbohydrate diet, regular exercise program Need close follow-up with endocrinology   bilateral carotid artery stenosis Progression in disease on the left from 2016-2017 Velocities estimated stenosis around 50-60% Stressed importance of aggressive cholesterol control, diabetes control  Adjustment disorder with mixed anxiety and depressed mood Recent loss of her husband, doing better, lonely at night  Pure hypercholesterolemia Long discussion concerning management of her cholesterol. She will try zetia, willing to retry Crestor 2 days per week If she has myalgias will stop the Crestor again Suspect she will need repatha or praluent given she needs 100 point drop in her LDL to reach goal. We'll place consult to pharmacy in [place] for assistance to see if she will qualify.   Total encounter time more than 25 minutes  Greater than 50% was spent in counseling and coordination of care with the patient   Disposition:    F/U  12 months   Orders Placed This Encounter  Procedures  . EKG 12-Lead     Signed, Esmond Plants, M.D., Ph.D. 10/20/2016  [place], [place]

## 2016-10-20 NOTE — Telephone Encounter (Signed)
S/w patient and let her know that she does not qualify for PCSK-9 injectables at this time. She verbalized understanding and will pick up her new prescriptions from [facility] today with Dr Rockey Situ tomorrow.

## 2016-10-20 NOTE — Patient Instructions (Addendum)
Medication Instructions:   Retry zetia daily and crestor 2 times a week  We will contact the pharmacist in [place] to ask about repatha and praluent  Labwork:  No new labs needed  Testing/Procedures:  No further testing at this time   I recommend watching educational videos on topics of interest to you at:       [email or web address]  Enter code: HEARTCARE    Follow-Up: It was a pleasure seeing you in the office today. Please call us if you have new issues that need to be addressed before your next appt.  [phone]  Your physician wants you to follow-up in: 12 months.  You will receive a reminder letter in the mail two months in advance. If you don't receive a letter, please call our office to schedule the follow-up appointment.  If you need a refill on your cardiac medications before your next appointment, please call your pharmacy.

## 2016-10-20 NOTE — Telephone Encounter (Signed)
-----   Message from Rockne Menghini, RPH-CPP sent at 10/20/2016  1:38 PM EST ----- Regarding: RE: Repatha/Praluent Hi!  Hope you and your boys are well.  Still miss you over here.    It doesn't look as though she qualifies for PCSK-9 at this time.  She doesn't have any significant CAD herself and although her LDL is high, she wouldn't qualify for the industry definition of familial hyperlipidemia.    Sorry! Erasmo Downer ----- Message ----- From: Vanessa Ralphs, RN Sent: 10/20/2016  10:29 AM To: [facility] Subject: Repatha/Praluent                               Hello,  Dr Rockey Situ would like patient to be contacted to see if patient is eligible for injectables Repatha or Praluent. If there's anything else I need to do, let me know.  Thanks so very much! Lars Pinks, RN

## 2016-10-31 ENCOUNTER — Telehealth: Payer: Self-pay

## 2016-10-31 ENCOUNTER — Encounter: Payer: Self-pay | Admitting: Family Medicine

## 2016-10-31 ENCOUNTER — Ambulatory Visit (INDEPENDENT_AMBULATORY_CARE_PROVIDER_SITE_OTHER): Payer: PPO | Admitting: Family Medicine

## 2016-10-31 VITALS — BP 110/74 | HR 98 | Temp 97.7°F | Resp 16 | Ht 64.0 in | Wt 152.8 lb

## 2016-10-31 DIAGNOSIS — R8290 Unspecified abnormal findings in urine: Secondary | ICD-10-CM | POA: Diagnosis not present

## 2016-10-31 DIAGNOSIS — N898 Other specified noninflammatory disorders of vagina: Secondary | ICD-10-CM | POA: Diagnosis not present

## 2016-10-31 DIAGNOSIS — N760 Acute vaginitis: Secondary | ICD-10-CM | POA: Diagnosis not present

## 2016-10-31 LAB — POC URINALSYSI DIPSTICK (AUTOMATED)
Bilirubin, UA: NEGATIVE
Blood, UA: NEGATIVE
Glucose, UA: NEGATIVE
Ketones, UA: NEGATIVE
LEUKOCYTES UA: NEGATIVE
Nitrite, UA: NEGATIVE
PROTEIN UA: NEGATIVE
Spec Grav, UA: 1.015
UROBILINOGEN UA: 0.2
pH, UA: 6

## 2016-10-31 NOTE — Telephone Encounter (Signed)
Will see then. 

## 2016-10-31 NOTE — Progress Notes (Signed)
Pre visit review using our clinic review tool, if applicable. No additional management support is needed unless otherwise documented below in the visit note. 

## 2016-10-31 NOTE — Telephone Encounter (Signed)
Pt has appt 10/31/16 at 3:30 with Dr Darnell Level.

## 2016-10-31 NOTE — Telephone Encounter (Signed)
PLEASE NOTE: All timestamps contained within this report are represented as [place] Standard Time. CONFIDENTIALTY NOTICE: This fax transmission is intended only for the addressee. It contains information that is legally privileged, confidential or otherwise protected from use or disclosure. If you are not the intended recipient, you are strictly prohibited from reviewing, disclosing, copying using or disseminating any of this information or taking any action in reliance on or regarding this information. If you have received this fax in error, please notify us immediately by telephone so that we can arrange for its return to [place]. Phone: [phone], Toll-Free: [phone], Fax: [phone] Page: 1 of 1 Call Id: [place]:[id number] [place] Patient Name: Cassandra Benson Gender: Female DOB: 05/04/1941 Age: 76 Y 51 M 3 D Return Phone Number: [phone] (Primary), [phone] (Secondary) City/State/Zip: [place] Client [place] [facility] - Client Client Site [place] - Day Who Is Calling Patient / Member / Family / Caregiver Call Type Triage / Clinical Relationship To Patient Self Return Phone Number [phone] (Primary) Chief Complaint Abdominal Pain Reason for Call Symptomatic / Request for [place] states urine has odor and kidney pain. Appointment Disposition EMR Appointment Not Necessary Info pasted into Epic No Nurse Assessment Nurse: Leilani Merl, RN, Heather Date/Time (Eastern Time): 10/31/2016 7:17:30 AM Confirm and document reason for call. If symptomatic, describe symptoms. ---Caller states that her urine has a bad odor since Friday, no burning, she is a diabetic. She is sore around the kidney area since Wednesday or Thursday Does the PT have any chronic conditions? (i.e. diabetes, asthma, etc.) ---Yes List chronic  conditions. ---DM Guidelines Guideline Title Affirmed Question Urinary Symptoms Side (flank) or lower back pain present Disp. Time Eilene Ghazi Time) Disposition Final User 10/31/2016 7:19:55 AM See Physician within 24 Hours Yes Standifer, RN, Heather Referrals REFERRED TO PCP OFFICE Care Advice Given Per Guideline SEE PHYSICIAN WITHIN 24 HOURS: * IF OFFICE WILL BE OPEN: You need to be seen within the next 24 hours. Call your doctor when the office opens, and make an appointment. CALL BACK IF: * Fever occurs * Unable to urinate and bladder feels full * You become worse. CARE ADVICE given per Urinary Symptoms (Adult) guideline.

## 2016-10-31 NOTE — Progress Notes (Signed)
BP 110/74 (BP Location: Left Arm, Patient Position: Sitting, Cuff Size: Normal)   Pulse 98   Temp 97.7 F (36.5 C) (Oral)   Resp 16   Ht 5\' 4"  (1.626 m)   Wt 152 lb 12.8 oz (69.3 kg)   SpO2 97%   BMI 26.23 kg/m    CC: foul smelling urine Subjective:    Patient ID: Cassandra Benson, female    DOB: 1941-01-14, 76 y.o.   MRN: [id number]  HPI: Cassandra Benson is a 76 y.o. female presenting on 10/31/2016 for Acute Visit (pt c/o foul smelling urine for the past 3 weeks off and on.)   3 wk h/o intermittent foul smelling urine. Denies UTI sxs of dysuria, urgency, frequency, incomplete emptying, hematuria. No abdominal pain or fevers/chills, vomiting. No bowel changes. Some flank soreness intermittently, + nausea yesterday.   Some intermittent vaginal discharge over last few weeks - yellow/green.  No recent UTI, no recent antibiotics.  No new foods or diet changes. No new meds.  No changes in lotions, detergents, soaps or shampoos.   She does stay well hydrated with plenty of water throughout the day.   Relevant past medical, surgical, family and social history reviewed and updated as indicated. Interim medical history since our last visit reviewed. Allergies and medications reviewed and updated. Current Outpatient Prescriptions on File Prior to Visit  Medication Sig  . aspirin EC 81 MG tablet Take 81 mg by mouth daily.  . BD ULTRA-FINE LANCETS lancets Use as instructed upto 6 times daily  . Cholecalciferol 50000 UNITS TABS Take 25,000 mg by mouth every 14 (fourteen) days. Take one tablet by mouth every 14 days (2 weeks)  . Cyanocobalamin (B-12 SL) Place 1 drop under the tongue daily.   Marland Kitchen ezetimibe (ZETIA) 10 MG tablet Take 1 tablet (10 mg total) by mouth daily.  Marland Kitchen glucagon (GLUCAGON EMERGENCY) 1 MG injection Inject 1 mg into the muscle once as needed.  Marland Kitchen ibuprofen (ADVIL,MOTRIN) 600 MG tablet Take 1 tablet (600 mg total) by mouth every 8 (eight) hours as needed.  . insulin aspart  (NOVOLOG) 100 UNIT/ML injection Use in an insulin pump upto 50 units per day  . Lecithin 400 MG CAPS Take 1 capsule by mouth once a week.   Marland Kitchen LEVOXYL 75 MCG tablet TAKE 1 TABLET 6 DAYS A WEEK, AND 1.5 TABLETS BY MOUTH ONE DAY A WEEK.  Marland Kitchen lisinopril (PRINIVIL,ZESTRIL) 10 MG tablet take 1 tablet by mouth once daily  . metFORMIN (GLUCOPHAGE-XR) 500 MG 24 hr tablet Take 1 tablet (500 mg total) by mouth 3 (three) times daily.  . rosuvastatin (CRESTOR) 5 MG tablet Take 1 tablet (5 mg total) by mouth daily.   No current facility-administered medications on file prior to visit.     Review of Systems Per HPI unless specifically indicated in ROS section     Objective:    BP 110/74 (BP Location: Left Arm, Patient Position: Sitting, Cuff Size: Normal)   Pulse 98   Temp 97.7 F (36.5 C) (Oral)   Resp 16   Ht 5\' 4"  (1.626 m)   Wt 152 lb 12.8 oz (69.3 kg)   SpO2 97%   BMI 26.23 kg/m   Wt Readings from Last 3 Encounters:  10/31/16 152 lb 12.8 oz (69.3 kg)  10/20/16 151 lb (68.5 kg)  10/03/16 153 lb (69.4 kg)    Physical Exam  Constitutional: She appears well-developed and well-nourished. No distress.  Abdominal: Soft. Bowel sounds are normal. She  exhibits no distension and no mass. There is no tenderness. There is no rebound and no guarding.  Genitourinary:  Genitourinary Comments: Self collection for wet prep  Psychiatric: She has a normal mood and affect.  Nursing note and vitals reviewed.  Results for orders placed or performed in visit on 10/31/16  POCT Urinalysis Dipstick (Automated)  Result Value Ref Range   Color, UA yellow    Clarity, UA clear    Glucose, UA neg    Bilirubin, UA neg    Ketones, UA neg    Spec Grav, UA 1.015    Blood, UA neg    pH, UA 6.0    Protein, UA neg    Urobilinogen, UA 0.2    Nitrite, UA neg    Leukocytes, UA Negative Negative      Assessment & Plan:   Problem List Items Addressed This Visit    Foul smelling urine - Primary    UA WNL. Sent  UCx given sxs. Self collection wet prep sent off as well for endorsed vaginal discharge. If no resolution, pt will return for full pelvic exam. Pt agrees with plan.       Relevant Orders   POCT Urinalysis Dipstick (Automated) (Completed)   WET PREP BY MOLECULAR PROBE   Urine culture    Other Visit Diagnoses    Vaginal discharge       Relevant Orders   WET PREP BY MOLECULAR PROBE       Follow up plan: Return if symptoms worsen or fail to improve.  [name], MD

## 2016-10-31 NOTE — Patient Instructions (Signed)
Urine looking ok. Continue drinking plenty of water as up to now. Urine culture sent, wet prep sent. We will be in touch with results.  If ongoing, I recommend return for pelvic exam for wet prep.

## 2016-10-31 NOTE — Assessment & Plan Note (Signed)
UA WNL. Sent UCx given sxs. Self collection wet prep sent off as well for endorsed vaginal discharge. If no resolution, pt will return for full pelvic exam. Pt agrees with plan.

## 2016-11-01 LAB — WET PREP BY MOLECULAR PROBE
Candida species: NEGATIVE
GARDNERELLA VAGINALIS: NEGATIVE
Trichomonas vaginosis: NEGATIVE

## 2016-11-01 LAB — URINE CULTURE: Organism ID, Bacteria: NO GROWTH

## 2016-11-11 DIAGNOSIS — H353131 Nonexudative age-related macular degeneration, bilateral, early dry stage: Secondary | ICD-10-CM | POA: Diagnosis not present

## 2016-11-14 LAB — HM DIABETES EYE EXAM

## 2016-11-17 ENCOUNTER — Telehealth: Payer: Self-pay | Admitting: Pharmacist

## 2016-11-17 NOTE — Telephone Encounter (Signed)
Patient unable to tolerate Crestor low dose therapy - reports severe myalgia and leg pain.   Plan to start taking Zetia 10mg  tomorrow and will call MD office if unable tolerate.  Also recently diagnosed with Type 1 diabetes and will prefer to get her diabetes under control before initiating a new therapy.

## 2016-11-17 NOTE — Telephone Encounter (Signed)
-----   Message from Rockne Menghini, [place] sent at 10/26/2016  1:36 PM EST ----- Regarding: FW: Repatha/Praluent   ----- Message ----- From: Leeroy Bock, RPH Sent: 10/24/2016   7:29 AM To: Rockne Menghini, RPH-CPP, # Subject: RE: Repatha/Praluent                           Thanks - I'll forward this to Erasmo Downer since she had originally responded to your request. Agree that pt should qualify for PCSK9i.  Megan  ----- Message ----- From: Minna Merritts, MD Sent: 10/23/2016   7:32 PM To: Harlon Flor Supple, RPH Subject: FW: Repatha/Praluent                           She has significant PAD Carotid, 50 to 60% LDL 170 thx TGollan  ----- Message ----- From: Vanessa Ralphs, RN Sent: 10/20/2016   2:00 PM To: Minna Merritts, MD, Stana Bunting, RN Subject: [place] Alar: Repatha/Praluent                           FYI.....   ----- Message ----- From: Rockne Menghini, RPH-CPP Sent: 10/20/2016   1:38 PM To: Vanessa Ralphs, RN Subject: RE: Repatha/Praluent                           Hi!  Hope you and your boys are well.  Still miss you over here.    It doesn't look as though she qualifies for PCSK-9 at this time.  She doesn't have any significant CAD herself and although her LDL is high, she wouldn't qualify for the industry definition of familial hyperlipidemia.    Sorry! Erasmo Downer ----- Message ----- From: Vanessa Ralphs, RN Sent: 10/20/2016  10:29 AM To: [facility] Subject: Repatha/Praluent                               Hello,  Dr Rockey Situ would like patient to be contacted to see if patient is eligible for injectables Repatha or Praluent. If there's anything else I need to do, let me know.  Thanks so very much! Lars Pinks, RN

## 2016-11-30 ENCOUNTER — Encounter: Payer: Self-pay | Admitting: *Deleted

## 2016-12-19 ENCOUNTER — Ambulatory Visit (INDEPENDENT_AMBULATORY_CARE_PROVIDER_SITE_OTHER): Payer: PPO | Admitting: Family Medicine

## 2016-12-19 ENCOUNTER — Encounter: Payer: Self-pay | Admitting: Family Medicine

## 2016-12-19 VITALS — BP 136/72 | HR 96 | Temp 97.8°F | Wt 152.8 lb

## 2016-12-19 DIAGNOSIS — R1013 Epigastric pain: Secondary | ICD-10-CM

## 2016-12-19 DIAGNOSIS — E7800 Pure hypercholesterolemia, unspecified: Secondary | ICD-10-CM | POA: Diagnosis not present

## 2016-12-19 MED ORDER — PANTOPRAZOLE SODIUM 40 MG PO TBEC: 40.0000 mg | DELAYED_RELEASE_TABLET | Freq: Every day | ORAL | 1 refills | Status: DC

## 2016-12-19 MED ORDER — SUCRALFATE 1 G PO TABS: 1.0000 g | ORAL_TABLET | Freq: Three times a day (TID) | ORAL | 1 refills | Status: DC

## 2016-12-19 NOTE — Progress Notes (Signed)
Pre visit review using our clinic review tool, if applicable. No additional management support is needed unless otherwise documented below in the visit note. 

## 2016-12-19 NOTE — Assessment & Plan Note (Addendum)
Story/exam most consistent with peptic ulcer vs gastritis/esophagitis from GERD without red flags.  Discussed with patient. Pt endorses h/o intolerance to PPI in the past (prilosec). Agrees to trial protonix - treat with protonix 40mg  daily + carafate with meals. If unable to tolerate protonix, rec start OTC zantac 150mg  BID.  No records of latest EGD.  Update with effect.

## 2016-12-19 NOTE — Patient Instructions (Addendum)
Try to get me records of latest colonoscopy (2014 at [facility]).  I think you have ulcer or gastritis/esophagitis. Treat with starting carafate with meals and protonix (pantoprazole) once daily. If no better with this, let us know for GI referral for further evaluation. May pick up zantac if protonix not tolerated.   Peptic Ulcer A peptic ulcer is a sore in the lining of the esophagus (esophageal ulcer), the stomach (gastric ulcer), or the first part of the small intestine (duodenal ulcer). The ulcer causes gradual wearing away (erosion) into the deeper tissue. What are the causes? Normally, the lining of the stomach and the small intestine protects itself from the acid that digests food. The protective lining can be damaged by:  An infection caused by a germ (bacterium) called Helicobacter pylori or H. pylori.  Regular use of NSAIDs, such as ibuprofen or aspirin.  Rare tumors in the stomach, small intestine, or pancreas (Zollinger-Ellison syndrome). What increases the risk? The following factors may make you more likely to develop this condition:  Smoking.  Having a family history of ulcer disease. What are the signs or symptoms? Symptoms of this condition include:  Burning pain or gnawing in the area between the chest and the belly button. The pain may be worse on an empty stomach and at night.  Heartburn.  Nausea and vomiting.  Bloating. If the ulcer results in bleeding, it can cause:  Black, tarry stools.  Vomiting of bright red blood.  Vomiting of material that looks like coffee grounds. How is this diagnosed? This condition may be diagnosed based on:  Medical history and physical exam.  Various tests or procedures, such as:  Blood tests, stool tests, or breath tests to check for the H. pylori bacterium.  An X-ray exam (upper gastrointestinal series) of the esophagus, stomach, and small intestine.  Upper endoscopy. The health care provider examines the esophagus,  stomach, and small intestine using a small flexible tube that has a video camera at the end.  Biopsy. A tissue sample is removed to be examined under a microscope. How is this treated? Treatment for this condition may include:  Eliminating the cause of the ulcer, such as smoking or the use of NSAIDs or alcohol.  Medicines to reduce the amount of acid in your digestive tract.  Antibiotic medicines, if the ulcer is caused by the H. pylori bacterium.  An upper endoscopy to treat a bleeding ulcer.  Surgery, if the bleeding is severe or if the ulcer created a hole somewhere in the digestive system. Follow these instructions at home:  Avoid alcohol and caffeine.  Do not use any tobacco products, such as cigarettes, chewing tobacco, and e-cigarettes. If you need help quitting, ask your health care provider.  Take over-the-counter and prescription medicines only as told by your health care provider. Do not use over-the-counter medicines in place of prescription medicines unless your health care provider approves.  Keep all follow-up visits as told by your health care provider. This is important. Contact a health care provider if:  Your symptoms do not improve within 7 days of starting treatment.  You have ongoing indigestion or heartburn. Get help right away if:  You have sudden, sharp, or persistent pain in your abdomen.  You have bloody or dark black, tarry stools.  You vomit blood or material that looks like coffee grounds.  You become light-headed or you feel faint.  You become weak.  You become sweaty or clammy. This information is not intended to replace advice  given to you by your health care provider. Make sure you discuss any questions you have with your health care provider. Document Released: 09/09/2000 Document Revised: 02/15/2016 Document Reviewed: 06/13/2015 Elsevier Interactive Patient Education  2017 [employer].

## 2016-12-19 NOTE — Progress Notes (Signed)
BP 136/72   Pulse 96   Temp 97.8 F (36.6 C) (Oral)   Wt 152 lb 12 oz (69.3 kg)   BMI 26.22 kg/m    CC: abd pain Subjective:    Patient ID: Cassandra Benson, female    DOB: [birth year], 76 y.o.   MRN: [id number]  HPI: Cassandra Benson is a 76 y.o. female presenting on 12/19/2016 for Abdominal Pain (burning from esophagus all the way down; hx of ulcers)   5 wk h/o abdomen burning that starts in chest and travels down to navel. Burning awakens her. All types of foods can bring this on - even oatmeal. + nausea and water brash in the middle of the night. No fevers/chills, vomiting, dysphagia. No globus sensation. No unexpected weight loss. Appetite down. No UTI sxs.   She does take ibuprofen but not regularly. She takes daily aspirin 81mg .  She already avoids spicy foods.  She has stopped coffee.  She doesn't drink alcohol.  EGD 03/2007 - 2 ulcers treated with carafate x2 wks.  EGD and colonoscopy ~2013 - overall normal per patient report.   HLD - intolerance to statins. Intolerant to zetia.   Pt states she cannot tolerate PPIs - doesn't remember side effect (omeprazole).   Relevant past medical, surgical, family and social history reviewed and updated as indicated. Interim medical history since our last visit reviewed. Allergies and medications reviewed and updated. Outpatient Medications Prior to Visit  Medication Sig Dispense Refill  . aspirin EC 81 MG tablet Take 81 mg by mouth daily.    . BD ULTRA-FINE LANCETS lancets Use as instructed upto 6 times daily 600 each 2  . Cholecalciferol 50000 UNITS TABS Take 25,000 mg by mouth every 14 (fourteen) days. Take one tablet by mouth every 14 days (2 weeks)    . Cyanocobalamin (B-12 SL) Place 1 drop under the tongue daily.     Marland Kitchen glucagon (GLUCAGON EMERGENCY) 1 MG injection Inject 1 mg into the muscle once as needed. 1 each 12  . ibuprofen (ADVIL,MOTRIN) 600 MG tablet Take 1 tablet (600 mg total) by mouth every 8 (eight) hours as needed.  30 tablet 0  . insulin aspart (NOVOLOG) 100 UNIT/ML injection Use in an insulin pump upto 50 units per day 2 vial 6  . Lecithin 400 MG CAPS Take 1 capsule by mouth once a week.     Marland Kitchen LEVOXYL 75 MCG tablet TAKE 1 TABLET 6 DAYS A WEEK, AND 1.5 TABLETS BY MOUTH ONE DAY A WEEK. 32 tablet 3  . lisinopril (PRINIVIL,ZESTRIL) 10 MG tablet take 1 tablet by mouth once daily 30 tablet 3  . metFORMIN (GLUCOPHAGE-XR) 500 MG 24 hr tablet Take 1 tablet (500 mg total) by mouth 3 (three) times daily. 270 tablet 1  . ezetimibe (ZETIA) 10 MG tablet Take 1 tablet (10 mg total) by mouth daily. (Patient not taking: Reported on 12/19/2016) 30 tablet 11  . rosuvastatin (CRESTOR) 5 MG tablet Take 1 tablet (5 mg total) by mouth daily. (Patient not taking: Reported on 12/19/2016) 90 tablet 3   No facility-administered medications prior to visit.      Per HPI unless specifically indicated in ROS section below Review of Systems     Objective:    BP 136/72   Pulse 96   Temp 97.8 F (36.6 C) (Oral)   Wt 152 lb 12 oz (69.3 kg)   BMI 26.22 kg/m   Wt Readings from Last 3 Encounters:  12/19/16 152 lb 12  oz (69.3 kg)  10/31/16 152 lb 12.8 oz (69.3 kg)  10/20/16 151 lb (68.5 kg)    Physical Exam  Constitutional: She appears well-developed and well-nourished. No distress.  HENT:  Mouth/Throat: Oropharynx is clear and moist. No oropharyngeal exudate.  Cardiovascular: Normal rate, regular rhythm, normal heart sounds and intact distal pulses.   No murmur heard. Pulmonary/Chest: Effort normal and breath sounds normal. No respiratory distress. She has no wheezes. She has no rales.  Abdominal: Soft. Normal appearance and bowel sounds are normal. She exhibits no distension and no mass. There is no hepatosplenomegaly. There is generalized tenderness. There is no rigidity, no rebound, no guarding, no CVA tenderness and negative Murphy's sign.  Diffuse mild tenderness but predominantly epigastric region  Musculoskeletal: She  exhibits no edema.  Skin: Skin is warm and dry. No rash noted.  Psychiatric: She has a normal mood and affect.  Nursing note and vitals reviewed.  Results for orders placed or performed in visit on 11/17/16  HM DIABETES EYE EXAM  Result Value Ref Range   HM Diabetic Eye Exam No Retinopathy No Retinopathy      Assessment & Plan:   Problem List Items Addressed This Visit    Epigastric abdominal pain - Primary    Story/exam most consistent with peptic ulcer vs gastritis/esophagitis from GERD without red flags.  Discussed with patient. Pt endorses h/o intolerance to PPI in the past (prilosec). Agrees to trial protonix - treat with protonix 40mg  daily + carafate with meals. If unable to tolerate protonix, rec start OTC zantac 150mg  BID.  No records of latest EGD.  Update with effect.       Hyperlipidemia    Intolerant to statins and zetia. Working with cardiology lipid clinic for praluent assistance.           Follow up plan: Return if symptoms worsen or fail to improve.  [name], MD

## 2016-12-19 NOTE — Assessment & Plan Note (Signed)
Intolerant to statins and zetia. Working with cardiology lipid clinic for praluent assistance.

## 2016-12-20 ENCOUNTER — Telehealth: Payer: Self-pay

## 2016-12-20 ENCOUNTER — Encounter: Payer: Self-pay | Admitting: Family Medicine

## 2016-12-20 NOTE — Telephone Encounter (Signed)
PLEASE NOTE: All timestamps contained within this report are represented as [place] Standard Time. CONFIDENTIALTY NOTICE: This fax transmission is intended only for the addressee. It contains information that is legally privileged, confidential or otherwise protected from use or disclosure. If you are not the intended recipient, you are strictly prohibited from reviewing, disclosing, copying using or disseminating any of this information or taking any action in reliance on or regarding this information. If you have received this fax in error, please notify us immediately by telephone so that we can arrange for its return to [place]. Phone: [phone], Toll-Free: [phone], Fax: [phone] Page: 1 of 1 Call Id: [id number] [place] Patient Name: Cassandra Benson Gender: Female DOB: August 17, 1941 Age: 76 Y 5 M 25 D Return Phone Number: City/State/Zip: [place] Client [place] Night - Client Client Site [place] Physician [name] - MD Who Is Calling Pharmacy Call Type Pharmacy Send to RN Chief Complaint Prescription Refill or Medication Request (non symptomatic) Reason for Call Request to speak to Physician Initial Comment Judson Roch from [place] [phone] option 8 then 1. trying to track down a script that PT was given this PM for an ulcer that is to start asap. Additional Comment Pharmacy Name [facility] Pharmacist Name Clara Number [phone] option 8 then 1 Nurse Assessment Guidelines Guideline Title Affirmed Question Disp. Time Eilene Ghazi Time) Disposition Final User 12/19/2016 8:56:24 PM Clinical Call Yes [place], RN, Joycelyn Schmid

## 2016-12-20 NOTE — Telephone Encounter (Signed)
I spoke with pt and advised rxs sent to [place]. Pt voiced understanding and she said that was OK and she will pick up at [place]. Nothing further needed.

## 2016-12-22 ENCOUNTER — Other Ambulatory Visit: Payer: Self-pay | Admitting: Family Medicine

## 2016-12-22 DIAGNOSIS — Z1231 Encounter for screening mammogram for malignant neoplasm of breast: Secondary | ICD-10-CM

## 2016-12-25 ENCOUNTER — Emergency Department: Payer: PPO

## 2016-12-25 ENCOUNTER — Emergency Department
Admission: EM | Admit: 2016-12-25 | Discharge: 2016-12-25 | Disposition: A | Discharge status: Home / Self Care | Payer: PPO | Attending: Emergency Medicine | Admitting: Emergency Medicine

## 2016-12-25 DIAGNOSIS — I10 Essential (primary) hypertension: Secondary | ICD-10-CM | POA: Insufficient documentation

## 2016-12-25 DIAGNOSIS — R079 Chest pain, unspecified: Secondary | ICD-10-CM | POA: Insufficient documentation

## 2016-12-25 DIAGNOSIS — Z7982 Long term (current) use of aspirin: Secondary | ICD-10-CM | POA: Insufficient documentation

## 2016-12-25 DIAGNOSIS — E119 Type 2 diabetes mellitus without complications: Secondary | ICD-10-CM | POA: Insufficient documentation

## 2016-12-25 DIAGNOSIS — Z794 Long term (current) use of insulin: Secondary | ICD-10-CM | POA: Insufficient documentation

## 2016-12-25 DIAGNOSIS — E039 Hypothyroidism, unspecified: Secondary | ICD-10-CM | POA: Diagnosis not present

## 2016-12-25 DIAGNOSIS — R42 Dizziness and giddiness: Secondary | ICD-10-CM | POA: Insufficient documentation

## 2016-12-25 LAB — BASIC METABOLIC PANEL
ANION GAP: 5 (ref 5–15)
BUN: 18 mg/dL (ref 6–20)
CALCIUM: 9.7 mg/dL (ref 8.9–10.3)
CO2: 29 mmol/L (ref 22–32)
CREATININE: 0.86 mg/dL (ref 0.44–1.00)
Chloride: 105 mmol/L (ref 101–111)
GFR calc Af Amer: >60 mL/min (ref >60)
GLUCOSE: 145 mg/dL — AB (ref 65–99)
Potassium: 4.5 mmol/L (ref 3.5–5.1)
Sodium: 139 mmol/L (ref 135–145)

## 2016-12-25 LAB — CBC
HCT: 41 % (ref 35.0–47.0)
Hemoglobin: 13.6 g/dL (ref 12.0–16.0)
MCH: 26.6 pg (ref 26.0–34.0)
MCHC: 33.1 g/dL (ref 32.0–36.0)
MCV: 80.4 fL (ref 80.0–100.0)
PLATELETS: 262 10*3/uL (ref 150–440)
RBC: 5.1 MIL/uL (ref 3.80–5.20)
RDW: 14 % (ref 11.5–14.5)
WBC: 11.6 10*3/uL — ABNORMAL HIGH (ref 3.6–11.0)

## 2016-12-25 LAB — URINALYSIS, COMPLETE (UACMP) WITH MICROSCOPIC
BILIRUBIN URINE: NEGATIVE
Bacteria, UA: NONE SEEN
Glucose, UA: NEGATIVE mg/dL
Ketones, ur: NEGATIVE mg/dL
NITRITE: NEGATIVE
PH: 6 (ref 5.0–8.0)
Protein, ur: NEGATIVE mg/dL
SPECIFIC GRAVITY, URINE: 1.003 — AB (ref 1.005–1.030)

## 2016-12-25 LAB — GLUCOSE, CAPILLARY: Glucose-Capillary: 132 mg/dL — ABNORMAL HIGH (ref 65–99)

## 2016-12-25 MED ORDER — MECLIZINE HCL 25 MG PO TABS
25.0000 mg | ORAL_TABLET | Freq: Once | ORAL | Status: AC
  Administered 2016-12-25: 25 mg via ORAL
  Filled 2016-12-25: qty 1

## 2016-12-25 NOTE — ED Notes (Signed)
Pt. States not feeling well today with weakness.  Pt. States pain to rt. Flank and lower rt. Side back, painful upon palpation.

## 2016-12-25 NOTE — ED Provider Notes (Signed)
[facility] Emergency Department Provider Note   ____________________________________________   I have reviewed the triage vital signs and the nursing notes.   HISTORY  Chief Complaint Dizziness   History limited by: Not Limited   HPI Cassandra Benson is a 76 y.o. female who presents to the emergency department today because of concerns for dizziness. Patient states that she gets dizzy on and off. This has been going on for quite some time. Has never bothered her significantly and even though she saw her doctor last week she did not mention it to him. She does at times have the sense of the room spinning around her. In addition the patient states that she also has had some right lower chest pain concerned she might have a pneumonia. No fevers.   Past Medical History:  Diagnosis Date  . Allergy   . Arthritis   . Carotid stenosis, asymptomatic 06/19/2015   2-54% RICA 27-06% LICA rpt 1 yr (10/3760)   . Diabetes mellitus without complication ([place])   . Fibromyalgia    prior PCP  . GERD (gastroesophageal reflux disease)    prior PCP  . Glaucoma    Narrow angle  . History of blood clots    DVT, in 20s, none since  . History of chicken pox   . History of diverticulitis   . History of pericarditis 1986   with hospitalization  . History of pneumonia 2014  . History of shingles   . History of UTI   . Hyperlipidemia   . Hypothyroidism   . Lupus (systemic lupus erythematosus) ([place])   . Shoulder pain left   h/o RTC tendonitis and adhesive capsulitis  . Sleep apnea    prior PCP  . Vitamin D deficiency    prior PCP    Patient Active Problem List   Diagnosis Date Noted  . Epigastric abdominal pain 12/19/2016  . LADA (latent autoimmune diabetes in adults), managed as type 1 ([place]) 10/03/2016  . Right shoulder pain 08/15/2016  . Thumb injury 02/02/2016  . Grief counseling 02/02/2016  . Foul smelling urine 07/13/2015  . Medicare annual wellness visit,  subsequent 06/19/2015  . Health maintenance examination 06/19/2015  . Advanced care planning/counseling discussion 06/19/2015  . Carotid stenosis, asymptomatic 06/19/2015  . Vitamin D deficiency   . Family history of premature CAD 06/02/2015  . Chest pain 05/26/2015  . Midline low back pain 09/25/2014  . 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 Hyperlipidemia 07/30/2014  . Essential hypertension 07/17/2014  . Hypothyroidism 07/17/2014  . Adjustment disorder with mixed anxiety and depressed mood 07/17/2014  . Apnea, sleep 08/22/2012  . LBP (low back pain) 02/27/2012  . SLE (systemic lupus erythematosus) ([place]) 01/06/2012    Past Surgical History:  Procedure Laterality Date  . ABDOMINAL HYSTERECTOMY  1978   fibroids and menorrhagia, ovaries remain  . [facility] normal per patient  . COLONOSCOPY WITH ESOPHAGOGASTRODUODENOSCOPY (EGD)  03/2007   2 ulcers, benign polyp, rpt 5 yrs ([place])  . PARTIAL HIP ARTHROPLASTY  2013   Right hip replacement  . TONSILLECTOMY AND ADENOIDECTOMY    . VAGINAL DELIVERY     x2, no complications    Prior to Admission medications   Medication Sig Start Date End Date Taking? Authorizing Provider  aspirin EC 81 MG tablet Take 81 mg by mouth daily.    Historical Provider, MD  BD ULTRA-FINE LANCETS lancets Use as instructed upto 6 times  daily 09/02/14   Haydee Monica, MD  Cholecalciferol 50000 UNITS TABS Take 25,000 mg by mouth every 14 (fourteen) days. Take one tablet by mouth every 14 days (2 weeks)    Historical Provider, MD  Cyanocobalamin (B-12 SL) Place 1 drop under the tongue daily.     Historical Provider, MD  ezetimibe (ZETIA) 10 MG tablet Take 1 tablet (10 mg total) by mouth daily. Patient not taking: Reported on 12/19/2016 10/20/16 10/20/17  Minna Merritts, MD  glucagon (GLUCAGON EMERGENCY) 1 MG injection Inject 1 mg into the muscle once as needed. 10/03/16   Philemon Kingdom, MD   ibuprofen (ADVIL,MOTRIN) 600 MG tablet Take 1 tablet (600 mg total) by mouth every 8 (eight) hours as needed. 03/17/16   Jearld Fenton, NP  insulin aspart (NOVOLOG) 100 UNIT/ML injection Use in an insulin pump upto 50 units per day 10/03/16   Philemon Kingdom, MD  Lecithin 400 MG CAPS Take 1 capsule by mouth once a week.     Historical Provider, MD  LEVOXYL 75 MCG tablet TAKE 1 TABLET 6 DAYS A WEEK, AND 1.5 TABLETS BY MOUTH ONE DAY A WEEK. 11/12/14   Jackolyn Confer, MD  lisinopril (PRINIVIL,ZESTRIL) 10 MG tablet take 1 tablet by mouth once daily 08/22/16   [name], MD  metFORMIN (GLUCOPHAGE-XR) 500 MG 24 hr tablet Take 1 tablet (500 mg total) by mouth 3 (three) times daily. 02/02/16   [name], MD  pantoprazole (PROTONIX) 40 MG tablet Take 1 tablet (40 mg total) by mouth daily. 12/19/16   [name], MD  rosuvastatin (CRESTOR) 5 MG tablet Take 1 tablet (5 mg total) by mouth daily. Patient not taking: Reported on 12/19/2016 10/20/16   Minna Merritts, MD  sucralfate (CARAFATE) 1 g tablet Take 1 tablet (1 g total) by mouth 3 (three) times daily before meals. 12/19/16   [name], MD    Allergies Iodinated diagnostic agents; Codeine; Influenza vaccines; Valsartan; Erythromycin; Penicillins; and Sulfa antibiotics  Family History  Problem Relation Age of Onset  . CAD Mother 73    MI, aortic valve issues  . COPD Mother   . Lupus Mother   . CAD Father 21    CABG x2, aortic valve replacement  . Stroke Sister   . Alcohol abuse Brother   . CAD Brother 52    MI  . Stroke Brother   . Seizures Son   . COPD Brother   . CAD Brother 12    stent  . Diabetes Brother   . Depression Grandchild   . Cancer Maternal Aunt     breast  . Breast cancer Maternal Aunt   . Diabetes Sister     deceased  . CAD Sister 24    stents  . Breast cancer Maternal Aunt     Social History Social History  Substance Use Topics  . Smoking status: Never Smoker  . Smokeless tobacco: Never  Used  . Alcohol use No    Review of Systems  Constitutional: Negative for fever. Cardiovascular: Positive for right lower chest pain. Respiratory: Negative for shortness of breath. Gastrointestinal: Negative for abdominal pain, vomiting and diarrhea. Neurological: Negative for headaches, focal weakness or numbness. Positive for dizziness.  10-point ROS otherwise negative.  ____________________________________________   PHYSICAL EXAM:  VITAL SIGNS: ED Triage Vitals  Enc Vitals Group     BP 12/25/16 0147 127/63     Pulse Rate 12/25/16 0147 79     Resp 12/25/16 0147 18  Temp 12/25/16 0147 98 F (36.7 C)     Temp Source 12/25/16 0147 Oral     SpO2 12/25/16 0147 96 %     Weight 12/25/16 0148 152 lb (68.9 kg)     Height 12/25/16 0148 5\' 3"  (1.6 m)     Head Circumference --      Peak Flow --      Pain Score 12/25/16 0147 7    Constitutional: Alert and oriented. Well appearing and in no distress. Eyes: Conjunctivae are normal. Normal extraocular movements. ENT   Head: Normocephalic and atraumatic.   Nose: No congestion/rhinnorhea.   Mouth/Throat: Mucous membranes are moist.   Neck: No stridor. Hematological/Lymphatic/Immunilogical: No cervical lymphadenopathy. Cardiovascular: Normal rate, regular rhythm.  No murmurs, rubs, or gallops. Respiratory: Normal respiratory effort without tachypnea nor retractions. Breath sounds are clear and equal bilaterally. No wheezes/rales/rhonchi. Gastrointestinal: Soft and non tender. No rebound. No guarding.  Genitourinary: Deferred Musculoskeletal: Normal range of motion in all extremities. No lower extremity edema. Neurologic:  Normal speech and language. PERRL. EOMI. Face symmetric. Tongue midline. Strength 5/5 in upper and lower extremities. Finger to nose normal. No gross focal neurologic deficits are appreciated.  Skin:  Skin is warm, dry and intact. No rash noted. Psychiatric: Mood and affect are normal. Speech and  behavior are normal. Patient exhibits appropriate insight and judgment.  ____________________________________________    LABS (pertinent positives/negatives)  Labs Reviewed  BASIC METABOLIC PANEL - Abnormal; Notable for the following:       Result Value   Glucose, Bld 145 (*)    All other components within normal limits  CBC - Abnormal; Notable for the following:    WBC 11.6 (*)    All other components within normal limits  URINALYSIS, COMPLETE (UACMP) WITH MICROSCOPIC - Abnormal; Notable for the following:    Color, Urine STRAW (*)    APPearance CLEAR (*)    Specific Gravity, Urine 1.003 (*)    Hgb urine dipstick SMALL (*)    Leukocytes, UA TRACE (*)    Squamous Epithelial / LPF 0-5 (*)    All other components within normal limits  GLUCOSE, CAPILLARY - Abnormal; Notable for the following:    Glucose-Capillary 132 (*)    All other components within normal limits  CBG MONITORING, ED     ____________________________________________   EKG  I, Nance Pear, attending physician, personally viewed and interpreted this EKG  EKG Time: 0148 Rate: 82 Rhythm: normal sinus rhythm Axis: normal Intervals: qtc 418 QRS: narrow, low voltage ST changes: no st elevation Impression: abnormal ekg  ____________________________________________    RADIOLOGY  CXR IMPRESSION:  No acute pulmonary process.   ____________________________________________   PROCEDURES  Procedures  ____________________________________________   INITIAL IMPRESSION / ASSESSMENT AND PLAN / ED COURSE  Pertinent labs & imaging results that were available during my care of the patient were reviewed by me and considered in my medical decision making (see chart for details).  Patient presented to the emergency department today because of concerns for dizziness. Patient without any concerning findings on blood work. Neuro exam is within normal limits. This point unclear etiology of the patient's  somewhat chronic and intermittent dizziness. In addition a chest x-ray was obtained given the patient's complaint of right lower chest pain. This does not show any signs of pneumonia. Will plan on discharging to follow up with PCP.  ____________________________________________   FINAL CLINICAL IMPRESSION(S) / ED DIAGNOSES  Final diagnoses:  Dizziness     Note: This dictation  was prepared with Dragon dictation. Any transcriptional errors that result from this process are unintentional     Nance Pear, MD 12/25/16 [phone]

## 2016-12-25 NOTE — ED Notes (Signed)
Pt. States dizziness still remains.

## 2016-12-25 NOTE — ED Triage Notes (Signed)
Pt reports around 5 pm on Saturday she felt flushed in her face so she checked her temp and reports it was 93.5 and she had her son go buy another thermometer and rechecked it and it was 95.5. Pt also reports she has been feeling dizzy and nauseated this evening. Pt denies chest pain but does report she became short of breath with walking this evening. Pt at this time talking in a full and complete sentences with no difficulty and appears to be in no acute distress. Pt does report she is being treated for a stomach ulcer by her PCP at this time.

## 2016-12-25 NOTE — Discharge Instructions (Signed)
Please seek medical attention for any high fevers, chest pain, shortness of breath, change in behavior, persistent vomiting, bloody stool or any other new or concerning symptoms.  

## 2016-12-28 ENCOUNTER — Other Ambulatory Visit: Payer: Self-pay | Admitting: Family Medicine

## 2016-12-28 ENCOUNTER — Ambulatory Visit (INDEPENDENT_AMBULATORY_CARE_PROVIDER_SITE_OTHER): Payer: PPO | Admitting: Family Medicine

## 2016-12-28 ENCOUNTER — Encounter: Payer: Self-pay | Admitting: Family Medicine

## 2016-12-28 VITALS — BP 106/68 | HR 86 | Temp 98.1°F | Wt 149.5 lb

## 2016-12-28 DIAGNOSIS — R6889 Other general symptoms and signs: Secondary | ICD-10-CM | POA: Diagnosis not present

## 2016-12-28 DIAGNOSIS — I10 Essential (primary) hypertension: Secondary | ICD-10-CM

## 2016-12-28 DIAGNOSIS — R1013 Epigastric pain: Secondary | ICD-10-CM | POA: Diagnosis not present

## 2016-12-28 DIAGNOSIS — R42 Dizziness and giddiness: Secondary | ICD-10-CM | POA: Diagnosis not present

## 2016-12-28 DIAGNOSIS — M329 Systemic lupus erythematosus, unspecified: Secondary | ICD-10-CM | POA: Diagnosis not present

## 2016-12-28 NOTE — Progress Notes (Signed)
Pre visit review using our clinic review tool, if applicable. No additional management support is needed unless otherwise documented below in the visit note. 

## 2016-12-28 NOTE — Assessment & Plan Note (Signed)
h/o this, doubt acute flare.  Hyperesthesia present.

## 2016-12-28 NOTE — Assessment & Plan Note (Signed)
Not consistent with adrenal insufficiency.  Anticipate equipment malfunction - she has bought new thermometer.  Discussed ambient temperature at home

## 2016-12-28 NOTE — Assessment & Plan Note (Signed)
Mild improvement on current regimen of PPI and carafate. Continue. If no ongoing improvement over the next week, encouraged she contact us for GI referral. Pt agrees with plan.

## 2016-12-28 NOTE — Assessment & Plan Note (Signed)
Chronic, stable. Trial lower lisinopril dose.

## 2016-12-28 NOTE — Assessment & Plan Note (Addendum)
Endorses episodes of orthostatic lightheadedness. bp low today - she has not yet taking antihypertensive.  Check orthostatic vitals. Encouraged good hydration status. Start lower lisinopril dose 5mg  daily. Pt agrees with plan.

## 2016-12-28 NOTE — Progress Notes (Signed)
BP 106/68 (BP Location: Left Arm, Cuff Size: Normal)   Pulse 86   Temp 98.1 F (36.7 C) (Oral)   Wt 149 lb 8 oz (67.8 kg)   SpO2 98%   BMI 26.48 kg/m    CC: ER f/u visit Subjective:    Patient ID: Cassandra Benson, female    DOB: 12/20/1940, 76 y.o.   MRN: [id number]  HPI: Cassandra Benson is a 76 y.o. female presenting on 12/28/2016 for low grade temp and Hospitalization Follow-up   Seen here 3/26 with epigastric pain thought PUD related started on carafate TID AC and daily PPI (protonix). She is tolerating this well but endorses persistent epigastric burning pain.   Seen at ER 12/25/2016 with concerns for intermittent dizziness ongoing over months. Records reviewed. WBC 11.6. CXR without acute process. rec f/u with PCP.   She mainly went to ER because of concern for low temperature to 93.5. On recheck 95.5.  More fatigued recently.  Endorses intermittent orthostatic dizziness. Denies vertigo symptoms.   Relevant past medical, surgical, family and social history reviewed and updated as indicated. Interim medical history since our last visit reviewed. Allergies and medications reviewed and updated. Outpatient Medications Prior to Visit  Medication Sig Dispense Refill  . aspirin EC 81 MG tablet Take 81 mg by mouth daily.    . BD ULTRA-FINE LANCETS lancets Use as instructed upto 6 times daily 600 each 2  . Cholecalciferol 50000 UNITS TABS Take 25,000 mg by mouth every 14 (fourteen) days. Take one tablet by mouth every 14 days (2 weeks)    . Cyanocobalamin (B-12 SL) Place 1 drop under the tongue daily.     Marland Kitchen ezetimibe (ZETIA) 10 MG tablet Take 1 tablet (10 mg total) by mouth daily. 30 tablet 11  . glucagon (GLUCAGON EMERGENCY) 1 MG injection Inject 1 mg into the muscle once as needed. 1 each 12  . ibuprofen (ADVIL,MOTRIN) 600 MG tablet Take 1 tablet (600 mg total) by mouth every 8 (eight) hours as needed. 30 tablet 0  . insulin aspart (NOVOLOG) 100 UNIT/ML injection Use in an insulin  pump upto 50 units per day 2 vial 6  . Lecithin 400 MG CAPS Take 1 capsule by mouth once a week.     Marland Kitchen LEVOXYL 75 MCG tablet TAKE 1 TABLET 6 DAYS A WEEK, AND 1.5 TABLETS BY MOUTH ONE DAY A WEEK. 32 tablet 3  . metFORMIN (GLUCOPHAGE-XR) 500 MG 24 hr tablet Take 1 tablet (500 mg total) by mouth 3 (three) times daily. 270 tablet 1  . pantoprazole (PROTONIX) 40 MG tablet Take 1 tablet (40 mg total) by mouth daily. 30 tablet 1  . rosuvastatin (CRESTOR) 5 MG tablet Take 1 tablet (5 mg total) by mouth daily. 90 tablet 3  . sucralfate (CARAFATE) 1 g tablet Take 1 tablet (1 g total) by mouth 3 (three) times daily before meals. 60 tablet 1  . lisinopril (PRINIVIL,ZESTRIL) 10 MG tablet take 1 tablet by mouth once daily 30 tablet 3   No facility-administered medications prior to visit.      Per HPI unless specifically indicated in ROS section below Review of Systems     Objective:    BP 106/68 (BP Location: Left Arm, Cuff Size: Normal)   Pulse 86   Temp 98.1 F (36.7 C) (Oral)   Wt 149 lb 8 oz (67.8 kg)   SpO2 98%   BMI 26.48 kg/m   Wt Readings from Last 3 Encounters:  12/28/16 149  lb 8 oz (67.8 kg)  12/25/16 152 lb (68.9 kg)  12/19/16 152 lb 12 oz (69.3 kg)    BP Readings from Last 3 Encounters:  12/28/16 106/68  12/25/16 108/78  12/19/16 136/72   Physical Exam  Constitutional: She appears well-developed and well-nourished. No distress.  HENT:  Mouth/Throat: Oropharynx is clear and moist. No oropharyngeal exudate.  Eyes: Conjunctivae are normal. Pupils are equal, round, and reactive to light.  Cardiovascular: Normal rate, regular rhythm, normal heart sounds and intact distal pulses.   No murmur heard. Pulmonary/Chest: Effort normal and breath sounds normal. No respiratory distress. She has no wheezes. She has no rales.  Musculoskeletal: She exhibits no edema.  Skin: Skin is warm and dry. No rash noted.  Psychiatric: She has a normal mood and affect.  Nursing note and vitals  reviewed.  Results for orders placed or performed during the hospital encounter of 32/54/98  Basic metabolic panel  Result Value Ref Range   Sodium 139 135 - 145 mmol/L   Potassium 4.5 3.5 - 5.1 mmol/L   Chloride 105 101 - 111 mmol/L   CO2 29 22 - 32 mmol/L   Glucose, Bld 145 (H) 65 - 99 mg/dL   BUN 18 6 - 20 mg/dL   Creatinine, Ser 0.86 0.44 - 1.00 mg/dL   Calcium 9.7 8.9 - 10.3 mg/dL   GFR calc non Af Amer >60 >60 mL/min   GFR calc Af Amer >60 >60 mL/min   Anion gap 5 5 - 15  CBC  Result Value Ref Range   WBC 11.6 (H) 3.6 - 11.0 K/uL   RBC 5.10 3.80 - 5.20 MIL/uL   Hemoglobin 13.6 12.0 - 16.0 g/dL   HCT 41.0 35.0 - 47.0 %   MCV 80.4 80.0 - 100.0 fL   MCH 26.6 26.0 - 34.0 pg   MCHC 33.1 32.0 - 36.0 g/dL   RDW 14.0 11.5 - 14.5 %   Platelets 262 150 - 440 K/uL  Urinalysis, Complete w Microscopic  Result Value Ref Range   Color, Urine STRAW (A) YELLOW   APPearance CLEAR (A) CLEAR   Specific Gravity, Urine 1.003 (L) 1.005 - 1.030   pH 6.0 5.0 - 8.0   Glucose, UA NEGATIVE NEGATIVE mg/dL   Hgb urine dipstick SMALL (A) NEGATIVE   Bilirubin Urine NEGATIVE NEGATIVE   Ketones, ur NEGATIVE NEGATIVE mg/dL   Protein, ur NEGATIVE NEGATIVE mg/dL   Nitrite NEGATIVE NEGATIVE   Leukocytes, UA TRACE (A) NEGATIVE   RBC / HPF 0-5 0 - 5 RBC/hpf   WBC, UA 0-5 0 - 5 WBC/hpf   Bacteria, UA NONE SEEN NONE SEEN   Squamous Epithelial / LPF 0-5 (A) NONE SEEN  Glucose, capillary  Result Value Ref Range   Glucose-Capillary 132 (H) 65 - 99 mg/dL   Lab Results  Component Value Date   TSH 0.42 08/09/2016       Assessment & Plan:   Problem List Items Addressed This Visit    Body temperature low    Not consistent with adrenal insufficiency.  Anticipate equipment malfunction - she has bought new thermometer.  Discussed ambient temperature at home       Epigastric abdominal pain    Mild improvement on current regimen of PPI and carafate. Continue. If no ongoing improvement over the next  week, encouraged she contact us for GI referral. Pt agrees with plan.       Essential hypertension    Chronic, stable. Trial lower lisinopril dose.  Lightheadedness - Primary    Endorses episodes of orthostatic lightheadedness. bp low today - she has not yet taking antihypertensive.  Check orthostatic vitals. Encouraged good hydration status. Start lower lisinopril dose 5mg  daily. Pt agrees with plan.        SLE (systemic lupus erythematosus) (HCC)    h/o this, doubt acute flare.  Hyperesthesia present.           Follow up plan: Return if symptoms worsen or fail to improve.  [name], MD

## 2016-12-28 NOTE — Patient Instructions (Signed)
Keep an eye on temperature.  Make sure you stay well hydrated.  If ongoing trouble with orthostatic lightheadedness, decrease lisinopril to 5mg  daily (1/2 tab daily). Good to see you today, call us with questions.Marland Kitchen

## 2016-12-29 ENCOUNTER — Telehealth: Payer: Self-pay | Admitting: Internal Medicine

## 2016-12-29 NOTE — Telephone Encounter (Signed)
Will be on the look out for the forms from them for patient pump supplies.

## 2016-12-29 NOTE — Telephone Encounter (Signed)
Patient stated she was eligible to get pump supply's, Cassandra Benson will be sending her pump supply's. Just a [place]

## 2017-01-04 ENCOUNTER — Ambulatory Visit (INDEPENDENT_AMBULATORY_CARE_PROVIDER_SITE_OTHER): Payer: PPO | Admitting: Internal Medicine

## 2017-01-04 ENCOUNTER — Encounter: Payer: Self-pay | Admitting: Internal Medicine

## 2017-01-04 VITALS — BP 128/80 | HR 94 | Ht 63.5 in | Wt 153.0 lb

## 2017-01-04 DIAGNOSIS — R6889 Other general symptoms and signs: Secondary | ICD-10-CM | POA: Diagnosis not present

## 2017-01-04 DIAGNOSIS — E109 Type 1 diabetes mellitus without complications: Secondary | ICD-10-CM

## 2017-01-04 DIAGNOSIS — E039 Hypothyroidism, unspecified: Secondary | ICD-10-CM

## 2017-01-04 DIAGNOSIS — E139 Other specified diabetes mellitus without complications: Secondary | ICD-10-CM

## 2017-01-04 LAB — POCT GLYCOSYLATED HEMOGLOBIN (HGB A1C)

## 2017-01-04 LAB — TSH: TSH: 0.58 u[IU]/mL (ref 0.35–4.50)

## 2017-01-04 LAB — T4, FREE: Free T4: 0.86 ng/dL (ref 0.60–1.60)

## 2017-01-04 MED ORDER — METFORMIN HCL ER 500 MG PO TB24: 500.0000 mg | ORAL_TABLET | Freq: Three times a day (TID) | ORAL | 3 refills | Status: DC

## 2017-01-04 MED ORDER — SYNTHROID 75 MCG PO TABS: ORAL_TABLET | ORAL | 3 refills | Status: DC

## 2017-01-04 NOTE — Patient Instructions (Addendum)
Please change the pump settings as follows: - basal rates: 12 am: 0.800 units/h 3 am: 0.875 9:30 am: 0.100 5 pm: 1.500 6:30 pm: 1.300 11:30 pm: 1.800 >> 1.400 - ICR:   12 am: 10 6 pm: 8 - target: 130-130 except 6-8 am and 6 pm-12 am: 100-100  - ISF: 35 - Insulin on Board: 4h  Please continue Metformin XR 500 mg 3x a day.  Please continue Levoxyl 75 mcg daily 1 tab 6/7 days and 1.5 tabs 1/7 days.  Take the thyroid hormone every day, with water, at least 30 minutes before breakfast, separated by at least 4 hours from: - acid reflux medications - calcium - iron - multivitamins  Please check out the book:  Dr. Karl Luke: "Prevent and Reverse Heart Disease"  Please return in 3 months.

## 2017-01-04 NOTE — Progress Notes (Addendum)
Patient ID: Cassandra Benson, female   DOB: [birth year], 76 y.o.   MRN: [id number]   HPI: Cassandra Benson is a 76 y.o.-year-old female, returning for f/u for LADA, initially dx'ed in 1998 (76 y/o), started insulin at dx, started insulin pump in ~2008, uncontrolled, without complications and hypothyroidism. She previously saw endocrinology at [facility] (Dr. Janese Banks) and Dr Howell Rucks. First visit with me 09/2016.  She was in the ED with low body temperature, dizziness recently >> Lisinopril dose was decreased afterwards. She also c/o cough. She still feels very tired after the above episode.  Last hemoglobin A1c was: Lab Results  Component Value Date   HGBA1C 7.7 (H) 08/09/2016   HGBA1C 7.2 (H) 05/26/2015   HGBA1C 7.8 (H) 10/23/2014   Pt is on an insulin pump: Medtronic 723 - in 02/2016, without CGM, uses Novolog in the pump. Started to use [employer] supplies.  She is also on Glucophage ER 500 mg 3x a day. She cannot take the generic metformin ER because of severe diarrhea. Pump settings: - basal rates: 12 am: 0.800 units/h 3 am: 0.875 9:30 am: 0.100 5 pm: 1.500 6:30 pm: 1.300 11:30 pm: 1.800  (but did not make the change!) - ICR:   12 am: 10 6 pm: 8 - target: 130-130 except 6-8 am and 6 pm-12 am: 100-100  - ISF: 35 - Insulin on Board: 4h - bolus wizard: on TDD from basal insulin: 81% (18.2 units) >> 74% TDD from bolus insulin: 19% (4.3 units) >> 26% - extended bolusing: not using - changes infusion site: q7 days >> advised to change this every 3-4 days - trying to do this - Meter: Bayer Contour  Pt checks her sugars 2.5x a day and they are - 106 +/- 24 >> now 129 +/- 34: - am: 86-109 >> 98-130 - 2h after b'fast: 88-119 >> n/c - before lunch: 95-116 >> 92-132 - 2h after lunch: 107 >> 76 - before dinner: 97, 104, 112 >> 99-180, 202 (no bolusing with lunch) - 2h after dinner: n/c >> 133, 222 (no bolusing with dinner) - bedtime: n/c - nighttime:  43, 55 - midnight, 1-2 months ago, more  recently: 204 x1 >> 92-141, 183 No lows. Lowest sugar was 43 >> 82 at night; she has hypoglycemia awareness at 80. No previous hypoglycemia admission. Does have a glucagon kit at home. Highest sugar was 204 >> 202 >> 222. No previous DKA admissions.    Pt's meals are: - Breakfast: protein drink + almond milk - Lunch: PB jelly sandwich or sandwich with ham or cream cheese sandwich - Dinner: salads  - Snacks: pretzels; pork tenderloin + veggies; chicken + tenderloin  - no CKD, last BUN/creatinine:  Lab Results  Component Value Date   BUN 18 12/25/2016   BUN 13 08/09/2016   CREATININE 0.86 12/25/2016   CREATININE 0.91 08/09/2016  On Lisinopril. - last set of lipids: Lab Results  Component Value Date   CHOL 245 (H) 08/09/2016   HDL 41.80 08/09/2016   LDLCALC 160 01/11/2016   LDLDIRECT 177.0 08/09/2016   TRIG 258.0 (H) 08/09/2016   CHOLHDL 6 08/09/2016  On Crestor 5 >> mm cramps. On CoQ10. Pralue was not covered by her insurancent  - last eye exam was in 10/2016. No DR.  - no numbness and tingling in her feet. Pain in her L big toe - occasionally. Foot exam normal by PCP: 08/15/2016.  She also has hypothyroidism: - 2/2 Hashimoto's Thyroiditis - FH of Graves ds in  mother. - on Levoxyl  DAW 75 mcg daily 1 tab 6/7 days and 1.5 tabs 1/7 days  Last TSH: Lab Results  Component Value Date   TSH 0.42 08/09/2016   She also  has SLE.  Her husband passed away in 03-26-16.  At last visit, we also checked her for hirsutism and acne - labs normal: Component     Latest Ref Rng & Units 09/02/2014 09/08/2014  Testosterone     3 - 41 ng/dL 59 (H)   Testosterone Free     0.0 - 4.2 pg/mL 2.1   FSH     mIU/mL  68.6  LH     mIU/mL  43.4  DHEA-SO4     7 - 177 ug/dL 49   Cortisol, Plasma     ug/dL  1.3 (Normal Dexamethasone suppression test)   Estradiol     pg/mL  24.0   ROS: Constitutional: no weight gain/loss, + fatigue, no subjective hyperthermia/hypothermia Eyes: no blurry  vision, no xerophthalmia ENT: no sore throat, no nodules palpated in throat, no dysphagia/odynophagia, no hoarseness Cardiovascular: no CP/SOB/palpitations/ leg swelling Respiratory: + cough/no SOB Gastrointestinal: no N/V/D/C Musculoskeletal:+ muscle/no joint aches Skin: no rashes, + acne face, + increased in hair on chin Neurological: no tremors/numbness/tingling/dizziness, + rarely headaches  I reviewed pt's medications, allergies, PMH, social hx, family hx, and changes were documented in the history of present illness. Otherwise, unchanged from my initial visit note.  Past Medical History:  Diagnosis Date  . Allergy   . Arthritis   . Carotid stenosis, asymptomatic 06/19/2015   9-82% RICA 64-15% LICA rpt 1 yr (04/3093)   . Diabetes mellitus without complication ([place])   . Fibromyalgia    prior PCP  . GERD (gastroesophageal reflux disease)    prior PCP  . Glaucoma    Narrow angle  . History of blood clots    DVT, in 20s, none since  . History of chicken pox   . History of diverticulitis   . History of pericarditis 1986   with hospitalization  . History of pneumonia 2014  . History of shingles   . History of UTI   . Hyperlipidemia   . Hypothyroidism   . Lupus (systemic lupus erythematosus) ([place])   . Shoulder pain left   h/o RTC tendonitis and adhesive capsulitis  . Sleep apnea    prior PCP  . Vitamin D deficiency    prior PCP   Past Surgical History:  Procedure Laterality Date  . ABDOMINAL HYSTERECTOMY  1978   fibroids and menorrhagia, ovaries remain  . [facility] normal per patient  . COLONOSCOPY WITH ESOPHAGOGASTRODUODENOSCOPY (EGD)  03/2007   2 ulcers, benign polyp, rpt 5 yrs ([place], [place])  . PARTIAL HIP ARTHROPLASTY  2013   Right hip replacement  . TONSILLECTOMY AND ADENOIDECTOMY    . VAGINAL DELIVERY     x2, no complications   Social History   Social History  . Widowed   . Number of children: 2    Occupational History  Retired    Social History Main Topics  . Smoking status: Never Smoker  . Smokeless tobacco: Never Used  . Alcohol use No  . Drug use: No   Social History Narrative   Lives in [place] now. Recently moved from [place].   No pets.   Mother of Cassandra Benson.   Grandson committed suicide in [place]    Work - retired, prior Production manager  Hobbies - works with her church, [employer]   Exercise - limited   Diet - good water, fruits/vegetables daily, limited meat, protein drink every morning   Current Outpatient Prescriptions on File Prior to Visit  Medication Sig Dispense Refill  . aspirin EC 81 MG tablet Take 81 mg by mouth daily.    . BD ULTRA-FINE LANCETS lancets Use as instructed upto 6 times daily 600 each 2  . Cholecalciferol 50000 UNITS TABS Take 25,000 mg by mouth every 14 (fourteen) days. Take one tablet by mouth every 14 days (2 weeks)    . Cyanocobalamin (B-12 SL) Place 1 drop under the tongue daily.     Marland Kitchen ezetimibe (ZETIA) 10 MG tablet Take 1 tablet (10 mg total) by mouth daily. 30 tablet 11  . glucagon (GLUCAGON EMERGENCY) 1 MG injection Inject 1 mg into the muscle once as needed. 1 each 12  . ibuprofen (ADVIL,MOTRIN) 600 MG tablet Take 1 tablet (600 mg total) by mouth every 8 (eight) hours as needed. 30 tablet 0  . insulin aspart (NOVOLOG) 100 UNIT/ML injection Use in an insulin pump upto 50 units per day 2 vial 6  . Lecithin 400 MG CAPS Take 1 capsule by mouth once a week.     Marland Kitchen LEVOXYL 75 MCG tablet TAKE 1 TABLET 6 DAYS A WEEK, AND 1.5 TABLETS BY MOUTH ONE DAY A WEEK. 32 tablet 3  . lisinopril (PRINIVIL,ZESTRIL) 10 MG tablet take 1 tablet by mouth once daily 30 tablet 6  . metFORMIN (GLUCOPHAGE-XR) 500 MG 24 hr tablet Take 1 tablet (500 mg total) by mouth 3 (three) times daily. 270 tablet 1  . pantoprazole (PROTONIX) 40 MG tablet Take 1 tablet (40 mg total) by mouth daily. 30 tablet 1  . rosuvastatin (CRESTOR) 5 MG tablet Take 1 tablet  (5 mg total) by mouth daily. 90 tablet 3  . sucralfate (CARAFATE) 1 g tablet Take 1 tablet (1 g total) by mouth 3 (three) times daily before meals. 60 tablet 1   No current facility-administered medications on file prior to visit.    Allergies  Allergen Reactions  . Iodinated Diagnostic Agents Other (See Comments)    Itching (severe) and chest tightness  . Codeine Nausea Only  . Influenza Vaccines     Muscle weakness; unable to walk  . Valsartan     Allergy to generic only Other reaction(s): Cough "Hacking" cough  . Erythromycin Rash and Swelling  . Penicillins Swelling and Rash    Severe Swelling  . Sulfa Antibiotics Rash   Family History  Problem Relation Age of Onset  . CAD Mother 54    MI, aortic valve issues  . COPD Mother   . Lupus Mother   . CAD Father 22    CABG x2, aortic valve replacement  . Stroke Sister   . Alcohol abuse Brother   . CAD Brother 39    MI  . Stroke Brother   . Seizures Son   . COPD Brother   . CAD Brother 55    stent  . Diabetes Brother   . Depression Grandchild   . Cancer Maternal Aunt     breast  . Breast cancer Maternal Aunt   . Diabetes Sister     deceased  . CAD Sister 62    stents  . Breast cancer Maternal Aunt    PE: BP 128/80 (BP Location: Left Arm, Patient Position: Sitting)   Pulse 94   Ht 5' 3.5" (1.613 m)  Wt 153 lb (69.4 kg)   SpO2 97%   BMI 26.68 kg/m  Wt Readings from Last 3 Encounters:  01/04/17 153 lb (69.4 kg)  12/28/16 149 lb 8 oz (67.8 kg)  12/25/16 152 lb (68.9 kg)   Constitutional: slightly overweight, in NAD Eyes: PERRLA, EOMI, no exophthalmos ENT: moist mucous membranes, no thyromegaly, no cervical lymphadenopathy Cardiovascular: RRR, No MRG Respiratory: CTA B Gastrointestinal: abdomen soft, NT, ND, BS+ Musculoskeletal: no deformities, strength intact in all 4 Skin: moist, warm, no rashes, few acne spots on face Neurological: no tremor with outstretched hands, DTR normal in all  4  ASSESSMENT: 1. DM1, uncontrolled, without complications  2. Hypothyroidism  3. Low body temperature  4. Hirsutism and acne  PLAN:  1. Patient with long-standing, uncontrolled LADA, on insulin pump therapy + Metformin. She is getting most of her insulin from her basal rates, without relying too much on bolusing. However, even on this regimen, she is getting good control of her diabetes, with occasional hyperglycemia spikes, but not frequently. I- She has an unusual basal rates regimen, with almost no insulin between 9:30 AM and 5 PM and the high basal insulin rate later at night. At last visit, and she was having some mild low blood sugars during the night, I advised her to decrease the basal rate from 11:30 PM, however, she did not change this. We will do this now. - I she is doing a better job with changing her pump site every 3 days rather than every 7-10 days >> encouraged her to continue to change it on Sunday and Wednesday. - No other changes are necessary in her pump settings. We'll continue with metformin. - I advised her to:  Patient Instructions  Please change the pump settings as follows: - basal rates: 12 am: 0.800 units/h 3 am: 0.875 9:30 am: 0.100 5 pm: 1.500 6:30 pm: 1.300 11:30 pm: 1.800 >> 1.400 - ICR:   12 am: 10 6 pm: 8 - target: 130-130 except 6-8 am and 6 pm-12 am: 100-100  - ISF: 35 - Insulin on Board: 4h  Please continue Metformin XR 500 mg 3x a day.  Please check out the book:  Dr. Karl Luke: "Prevent and Reverse Heart Disease"  Please return in 3 months.  - continue checking sugars at different times of the day - check at least 3 times a day, rotating checks - advised for yearly eye exams >> she is UTD - checked HbA1c today: 6.9% (lower!) - Return to clinic in 3 mo with sugar log   2. Hypothyroidism - She continues on Levoxyl brand name 75 mcg daily 1 tab 6/7 days and 1.5 tabs 1/7 days. - She takes this correctly: every day, with  water, at least 30 minutes before breakfast, separated by at least 4 hours from: - acid reflux medications - calcium - iron - multivitamins - Will check TFTs today - She tells me that she feels that her cholesterol has increased ever since starting Levoxyl. She is interesting in switching to Synthroid brand name. I will send this to her pharmacy after the results today are back. - However, I discussed with her that it would be unlikely for thyroid hormones to increase the cholesterol, rather I would expect the opposite. Since she has very high LDL levels and she cannot tolerate statins and she cannot afford PCSK9 inh >> I suggested Dr. Maurine Minister diet plan to prevent and reverse heart disease. She is interested in this and agrees to try.  3. Low body temp - We'll check an ACTH and cortisol although my suspicion for adrenal insufficiency is quite low  4. Hirsutism and acne - Recent free testosterone level and dexamethasone suppression test normal, ruling out possible androgen producing tumor or Cushing's sd.  Needs refills  - Synthroid DAW  - time spent with the patient: 40 min, of which >50% was spent in reviewing her pump downloads, discussing her hypo- and hyper-glycemic episodes, reviewing previous labs and pump settings and developing a plan to avoid hypo- and hyper-glycemia. We also addressed her hypothyroidism..  Component     Latest Ref Rng & Units 01/04/2017  TSH     0.35 - 4.50 uIU/mL 0.58  T4,Free(Direct)     0.60 - 1.60 ng/dL 0.86   TFTs are normal. We'll refill the current dose of levothyroxine in form of Synthroid.  Component     Latest Ref Rng & Units 01/04/2017  Cortisol - AM     mcg/dL 17.6  C206 ACTH     6 - 50 pg/mL 15   Labs show no signs of adrenal insufficiency.  Philemon Kingdom, MD PhD [facility] Endocrinology

## 2017-01-05 LAB — CORTISOL-AM, BLOOD: Cortisol - AM: 17.6 ug/dL

## 2017-01-06 DIAGNOSIS — E109 Type 1 diabetes mellitus without complications: Secondary | ICD-10-CM | POA: Diagnosis not present

## 2017-01-06 LAB — ACTH: C206 ACTH: 15 pg/mL (ref 6–50)

## 2017-01-16 ENCOUNTER — Encounter: Payer: Self-pay | Admitting: *Deleted

## 2017-01-16 ENCOUNTER — Ambulatory Visit
Admission: RE | Admit: 2017-01-16 | Discharge: 2017-01-16 | Disposition: A | Discharge status: Home / Self Care | Payer: PPO | Source: Ambulatory Visit | Attending: Family Medicine | Admitting: Family Medicine

## 2017-01-16 DIAGNOSIS — Z1231 Encounter for screening mammogram for malignant neoplasm of breast: Secondary | ICD-10-CM | POA: Insufficient documentation

## 2017-01-16 LAB — HM MAMMOGRAPHY

## 2017-01-19 NOTE — Telephone Encounter (Signed)
See message and please advise, Thanks!  

## 2017-01-19 NOTE — Telephone Encounter (Signed)
Patient stated she is having a bad reaction diarrhea with the  generic medication glucagon (GLUCAGON EMERGENCY) 1 MG injection.    And she is totally out. Please advise

## 2017-01-19 NOTE — Telephone Encounter (Signed)
Glucagon is an emergency med taken only if her sugars are very low and she cannot take po. Why did she take it? How low were the sugars? I hope she will not have to take it much in the future, but there is nothing else to replace this.

## 2017-01-20 NOTE — Telephone Encounter (Signed)
I contacted the patient. She stated the medication causing her diarrhea episodes was not the glucagon kit but the Glucophage. Patient stated the generic form of this medication causes extreme episodes of diarrhea and she cannot take this. She stated the brand name medication does not cause diarrhea. Patient stated she was able to pick the brand name up but had to pay 85$ out of pocket because the insurance will not cover it. Would you like to try and get the brand name approved through the insurance? Thanks!

## 2017-01-20 NOTE — Telephone Encounter (Signed)
Yes, let's please start a PA. Thank you!

## 2017-01-20 NOTE — Telephone Encounter (Signed)
I contacted the patient and advised we have initiated the PA for the brand name metformin to [employer]. Patient advised once we receive the determination we would let her know.

## 2017-01-23 NOTE — Telephone Encounter (Signed)
[facility] notified us that there was already a PA on file, megan submitted that to the pharmacy. No response from them yet.

## 2017-01-25 ENCOUNTER — Telehealth: Payer: Self-pay

## 2017-01-25 NOTE — Telephone Encounter (Signed)
Called and spoke to patient about the denial for the Glucophage XR. I advised patient to call [facility], and see if they would be able to assist her. I gave her the number to contact, and advised her to call us back with what they said. Patient stated she would.

## 2017-01-26 ENCOUNTER — Other Ambulatory Visit: Payer: Self-pay

## 2017-01-26 NOTE — Patient Outreach (Signed)
[place] [facility]) Care Management  01/26/2017  KEHLANI VANCAMP 1941-01-27 [id number]  REFERRAL DATE; 01/26/17 REFERRAL SOURCE: Self referral / recommended by doctor REFERRAL REASON: medication concerns  SUBJECTIVE: Telephone call to patient. HIPAA verified.  Discussed and offered [facility] care management services to patient. Patient verbally agreed. MEDICATION CONCERNS: Patient states she is Type 1 diabetic. Patient states she has taken the brand name glucophage in the past.  States recently her insurance will not cover the brand name without approval. Patient states she has tried to take the generic but it gives her uncontrolled diarrhea and gas pains. States this has affected her quality of life.   Patient reports she is now taking the generic levothyroxine which has caused her cholesterol levels to increase. Patient states she is unable to take statin medications. Patient verbally agreed to [facility] care management pharmacy referral to discuss her concerns.  Patient denies need for nursing services. Patient reports she has gone to diabetic classes and is aware of how to manage.  PLAN; RNCM will refer patient to [facility] pharmacist.   Quinn Plowman RN,BSN,CCM [facility] Telephonic  [phone]

## 2017-01-30 ENCOUNTER — Other Ambulatory Visit: Payer: Self-pay | Admitting: Pharmacist

## 2017-01-30 NOTE — Patient Outreach (Signed)
[place] [facility]) Care Management  01/30/2017  Cassandra Benson 30-Jan-1941 [id number]  Referral received from [place], Lamar Blinks, regarding patient cost concerns with Glucophage XR brand name.   Based on chart review, it appears a prior authorization may have already been obtained for 2018 plan year.   Attempted to contact patient, no answer, unable to leave message.   Plan:  Will make another phone outreach attempt in the next week.   Karrie Meres, PharmD, [place] [phone]

## 2017-01-31 ENCOUNTER — Other Ambulatory Visit: Payer: Self-pay | Admitting: Pharmacist

## 2017-01-31 NOTE — Patient Outreach (Signed)
[place] [facility]) Care Management  01/31/2017  Cassandra Benson 08-11-1941 [id number]  Second unsuccessful phone outreach to patient.  No answer, and unable to leave message, phone continued to ring with no voicemail picking up.  Plan:  Will make third outreach attempt to patient next week.    Karrie Meres, PharmD, [place] [phone]

## 2017-02-02 ENCOUNTER — Telehealth: Payer: Self-pay | Admitting: *Deleted

## 2017-02-02 DIAGNOSIS — R1013 Epigastric pain: Secondary | ICD-10-CM

## 2017-02-02 NOTE — Telephone Encounter (Signed)
How is epigastric abdominal pain? If not better with treatment, would suggest GI referral as discussed. I would also offer abdominal ultrasound to eval for gallstone given right shoulderblade pain.  Let me know if she'd like one or both of above.

## 2017-02-02 NOTE — Telephone Encounter (Signed)
Patient left a voicemail stating that she wants to keep Dr. Danise Mina in the loop with what is going on with her since he is treating her for an ulcer. Patient stated that yesterday morning she had terrible pain under her right shoulder and thinks that she may have had a gallbladder attack. Patient started that she was burping alot. Patient wants to know if she should come in and see you for this?

## 2017-02-02 NOTE — Telephone Encounter (Signed)
Patient states her abdominal pain is not as severe as it was when she came in for OV, patient woke up with shoulder pain accompanied with burping. Patient would like ultrasound of gall bladder.

## 2017-02-03 NOTE — Telephone Encounter (Signed)
US ordered

## 2017-02-06 ENCOUNTER — Ambulatory Visit: Payer: Self-pay | Admitting: Pharmacist

## 2017-02-08 ENCOUNTER — Other Ambulatory Visit: Payer: Self-pay | Admitting: Pharmacist

## 2017-02-08 NOTE — Patient Outreach (Addendum)
[place] [facility]) Care Management  02/08/2017  Cassandra Benson 01-17-1941 [id number]  Received incoming call transferred to [place] Pharmacist from patient.  Patient returning missed calls from [place], HIPAA details verified with patient, including address.   Patient was initially referred to [place] Pharmacist for evaluation of patient cost concerns with medications, namely, Glucophage XR brand name.    Per review of chart and phone call to patient's pharmacy of choice [facility], noted that co-pay was $85 for #90 tabs/30 day supply.  Discussed with patient it appears her previous endocrinologist at [facility] may have previously obtained insurance coverage of brand name Glucophage XR.    Discussed with patient Medicare Part D plans allow beneficiaries or prescribers to request coverage determination of medication not on preferred drug list---it appears her prescriber had done this and her insurance placed medication on Tier 4 coverage level.  Discussed with patient unable to locate manufacturer patient assistance program for this medication.  She reports taking brand name due to side effects with generic formulation.   Upon review of medication list, patient noted to be on Novolog.  Discussed with patient her insurance plan may require authorization each year for Glucophage XR and each Medicare Part D plan may have different requirements to get this medication approved as she reports she used to have a different Part D plan.    Medication assistance needs:    1) Discussed with patient requirements for Social Security Administration Extra Help---she reports income exceeds requirements.   2) Discussed income and out-of-pocket prescription spend requirements for [employer] Patient Assistance----she believes she may meet requirements.    Of note, patient is hesitant to disclose personal information due to previous identify theft issues.  Discussed with patient, manufacturer patient  assistance requests this information as part of application process---it is free to apply to and she does not have to apply if she doesn't wish to.  There is of course no guarantee she will be found eligible.      Plan:  Novo Nordisk patient assistance application mailed to patient so she can consider if she wishes to apply.   Will place follow-up call to patient in the next 2 weeks.   Will route note to Dr Wardell Heath.   Karrie Meres, PharmD, [place] [phone]

## 2017-02-09 ENCOUNTER — Ambulatory Visit
Admission: RE | Admit: 2017-02-09 | Discharge: 2017-02-09 | Disposition: A | Discharge status: Home / Self Care | Payer: PPO | Source: Ambulatory Visit | Attending: Family Medicine | Admitting: Family Medicine

## 2017-02-09 ENCOUNTER — Encounter: Payer: Self-pay | Admitting: Pharmacist

## 2017-02-09 DIAGNOSIS — R1011 Right upper quadrant pain: Secondary | ICD-10-CM | POA: Diagnosis not present

## 2017-02-09 DIAGNOSIS — K760 Fatty (change of) liver, not elsewhere classified: Secondary | ICD-10-CM | POA: Insufficient documentation

## 2017-02-09 DIAGNOSIS — R1013 Epigastric pain: Secondary | ICD-10-CM

## 2017-02-13 ENCOUNTER — Encounter: Payer: Self-pay | Admitting: Family Medicine

## 2017-02-13 ENCOUNTER — Ambulatory Visit (INDEPENDENT_AMBULATORY_CARE_PROVIDER_SITE_OTHER): Payer: PPO | Admitting: Family Medicine

## 2017-02-13 VITALS — BP 120/72 | HR 83 | Wt 155.0 lb

## 2017-02-13 DIAGNOSIS — R1013 Epigastric pain: Secondary | ICD-10-CM

## 2017-02-13 DIAGNOSIS — I10 Essential (primary) hypertension: Secondary | ICD-10-CM | POA: Diagnosis not present

## 2017-02-13 DIAGNOSIS — R6889 Other general symptoms and signs: Secondary | ICD-10-CM | POA: Diagnosis not present

## 2017-02-13 DIAGNOSIS — E7800 Pure hypercholesterolemia, unspecified: Secondary | ICD-10-CM

## 2017-02-13 DIAGNOSIS — K760 Fatty (change of) liver, not elsewhere classified: Secondary | ICD-10-CM

## 2017-02-13 MED ORDER — ROSUVASTATIN CALCIUM 5 MG PO TABS: 5.0000 mg | ORAL_TABLET | ORAL | 3 refills | Status: DC

## 2017-02-13 NOTE — Assessment & Plan Note (Signed)
Anticipate hepatic steatosis. Discussed with patient dx as well as management strategies.

## 2017-02-13 NOTE — Assessment & Plan Note (Signed)
Brings log of temperatures from home - 95-97 degrees F taken with oral thermometer.

## 2017-02-13 NOTE — Assessment & Plan Note (Signed)
Intolerant to statins and zetia.  repatha not affordable.  Agrees to try crestor once weekly with slow titration as tolerated.

## 2017-02-13 NOTE — Progress Notes (Signed)
BP 120/72 (BP Location: Left Arm, Patient Position: Sitting, Cuff Size: Normal)   Pulse 83   Wt 155 lb (70.3 kg)   SpO2 98%   BMI 27.03 kg/m    CC: 6 mo f/u visit Subjective:    Patient ID: Cassandra Benson, female    DOB: [birth year], 76 y.o.   MRN: [id number]  HPI: Cassandra Benson is a 76 y.o. female presenting on 02/13/2017 for 6 month follow-up (Discuss [place] results)   See prior note for details. Seen here last month with epigastric abd discomfort thought PUD related - started on carafate and daily protonix with mild improvement. For ongoing pain, limited RUQ abd [place] was ordered - see below. Found fatty liver.  Intolerance to cholesterol medicines in the past - crestor caused myalgias, zetia caused fatigue. Has been unable to get approved for repatha/praluent shots.    Staying fatigued.  If needed GI would like to see Dr Hilarie Fredrickson.   Relevant past medical, surgical, family and social history reviewed and updated as indicated. Interim medical history since our last visit reviewed. Allergies and medications reviewed and updated. Outpatient Medications Prior to Visit  Medication Sig Dispense Refill  . aspirin EC 81 MG tablet Take 81 mg by mouth daily.    . BD ULTRA-FINE LANCETS lancets Use as instructed upto 6 times daily 600 each 2  . Cholecalciferol 50000 UNITS TABS Take 25,000 mg by mouth every 14 (fourteen) days. Take one tablet by mouth every 14 days (2 weeks)    . Cyanocobalamin (B-12 SL) Place 1 drop under the tongue daily.     Marland Kitchen glucagon (GLUCAGON EMERGENCY) 1 MG injection Inject 1 mg into the muscle once as needed. 1 each 12  . ibuprofen (ADVIL,MOTRIN) 600 MG tablet Take 1 tablet (600 mg total) by mouth every 8 (eight) hours as needed. 30 tablet 0  . insulin aspart (NOVOLOG) 100 UNIT/ML injection Use in an insulin pump upto 50 units per day 2 vial 6  . Lecithin 400 MG CAPS Take 1 capsule by mouth once a week.     Marland Kitchen lisinopril (PRINIVIL,ZESTRIL) 10 MG tablet take 1 tablet by  mouth once daily (Patient taking differently: take 1/2 tablet.) 30 tablet 6  . metFORMIN (GLUCOPHAGE-XR) 500 MG 24 hr tablet Take 1 tablet (500 mg total) by mouth 3 (three) times daily. 270 tablet 3  . pantoprazole (PROTONIX) 40 MG tablet Take 1 tablet (40 mg total) by mouth daily. 30 tablet 1  . SYNTHROID 75 MCG tablet Take by mouth 75 mcg daily 1 tab 6/7 days and 1.5 tabs 1/7 days. 90 tablet 3  . rosuvastatin (CRESTOR) 5 MG tablet Take 1 tablet (5 mg total) by mouth daily. 90 tablet 3  . sucralfate (CARAFATE) 1 g tablet Take 1 tablet (1 g total) by mouth 3 (three) times daily before meals. 60 tablet 1  . ezetimibe (ZETIA) 10 MG tablet Take 1 tablet (10 mg total) by mouth daily. (Patient not taking: Reported on 02/13/2017) 30 tablet 11   No facility-administered medications prior to visit.      Per HPI unless specifically indicated in ROS section below Review of Systems     Objective:    BP 120/72 (BP Location: Left Arm, Patient Position: Sitting, Cuff Size: Normal)   Pulse 83   Wt 155 lb (70.3 kg)   SpO2 98%   BMI 27.03 kg/m   Wt Readings from Last 3 Encounters:  02/13/17 155 lb (70.3 kg)  01/04/17 153 lb (  69.4 kg)  12/28/16 149 lb 8 oz (67.8 kg)    Physical Exam  Constitutional: She appears well-developed and well-nourished. No distress.  HENT:  Mouth/Throat: Oropharynx is clear and moist. No oropharyngeal exudate.  Cardiovascular: Normal rate, regular rhythm, normal heart sounds and intact distal pulses.   No murmur heard. Pulmonary/Chest: Effort normal and breath sounds normal. No respiratory distress. She has no wheezes. She has no rales.  Musculoskeletal: She exhibits no edema.  Psychiatric: She has a normal mood and affect.  Nursing note and vitals reviewed.  US ABDOMEN LIMITED - RIGHT UPPER QUADRANT COMPARISON:  None. FINDINGS: Gallbladder: No gallstones or wall thickening visualized. No sonographic Murphy sign noted by sonographer. Common bile duct: Diameter: 4  mm. Liver: Mildly increased in echogenicity consistent with fatty infiltration. No focal lesion is seen. IMPRESSION: Fatty liver. No other focal abnormality is noted. Electronically Signed   By: Inez Catalina M.D.   On: 02/09/2017 09:39    Assessment & Plan:   Problem List Items Addressed This Visit    Body temperature low    Brings log of temperatures from home - 95-97 degrees F taken with oral thermometer.       Epigastric abdominal pain    Ongoing burning discomfort but some improvement. Will Rx second course of carafate, continue protonix 40mg  daily.  If no better, will refer to GI. Pt agrees with plan. Desires Pyrtle if needs to see GI.       Essential hypertension    Chronic, stable controlled on low dose lisinopril.       Relevant Medications   rosuvastatin (CRESTOR) 5 MG tablet   Fatty liver - Primary    Anticipate hepatic steatosis. Discussed with patient dx as well as management strategies.       Hyperlipidemia    Intolerant to statins and zetia.  repatha not affordable.  Agrees to try crestor once weekly with slow titration as tolerated.      Relevant Medications   rosuvastatin (CRESTOR) 5 MG tablet       Follow up plan: Return in about 6 months (around 08/16/2017) for annual exam, prior fasting for blood work, medicare wellness visit.  [name], MD

## 2017-02-13 NOTE — Assessment & Plan Note (Signed)
Chronic, stable controlled on low dose lisinopril.

## 2017-02-13 NOTE — Assessment & Plan Note (Signed)
Ongoing burning discomfort but some improvement. Will Rx second course of carafate, continue protonix 40mg  daily.  If no better, will refer to GI. Pt agrees with plan. Desires Pyrtle if needs to see GI.

## 2017-02-13 NOTE — Patient Instructions (Addendum)
I suggest retry crestor 5mg  once weekly, if this is tolerated may go up to twice weekly. Go up as tolerated. If muscle aches develop, then return to previously tolerated dose.   Try second course of carafate. If no better, let us know for referral back to GI.  Return as needed or in 6 months for wellness visit with Cassandra Benson and physical.

## 2017-02-21 ENCOUNTER — Other Ambulatory Visit: Payer: Self-pay | Admitting: Pharmacist

## 2017-02-21 NOTE — Patient Outreach (Signed)
[place] [facility]) Care Management  02/21/2017  Cassandra Benson 1940/10/16 [id number]  Successful phone outreach to patient.  Patient reports she received application for [employer] Patient Assistance program and is intending to complete it.   Reminded patient of need to provide proof of annual household income and year-to-date out-of-pocket prescription drug spend.  Reminded patient of income and out-of-pocket spend requirements.    Patient reports she plans to complete application and mail it and supporting documentation back to [facility] Pharmacist.  Reminded patient there is no guarantee patient will be approved by manufacturer patient assistance program and each manufacturer patient assistance program sets it own eligibility criteria for patient assistance.   Plan:  Will wait for patient to mail back application and supporting documentation for [employer] Patient Assistance application.    Will send Dr Wardell Heath prescriber portion of application via fax for completion.   Karrie Meres, PharmD, [place] [phone]

## 2017-03-03 ENCOUNTER — Ambulatory Visit (INDEPENDENT_AMBULATORY_CARE_PROVIDER_SITE_OTHER): Payer: PPO | Admitting: Family Medicine

## 2017-03-03 ENCOUNTER — Encounter: Payer: Self-pay | Admitting: Family Medicine

## 2017-03-03 VITALS — BP 120/78 | HR 83 | Temp 98.1°F | Ht 63.5 in | Wt 155.0 lb

## 2017-03-03 DIAGNOSIS — H00026 Hordeolum internum left eye, unspecified eyelid: Secondary | ICD-10-CM | POA: Insufficient documentation

## 2017-03-03 DIAGNOSIS — H00025 Hordeolum internum left lower eyelid: Secondary | ICD-10-CM

## 2017-03-03 MED ORDER — CIPROFLOXACIN HCL 0.3 % OP SOLN: 1.0000 [drp] | OPHTHALMIC | 0 refills | Status: DC

## 2017-03-03 NOTE — Assessment & Plan Note (Addendum)
Anticipate internal stye - discussed with patient. Treat with warm compresses, lubricating eye drops, and cipro OP eye drops (sulfa allergy ?tmp).  Update if not improving with treatment, discussed possible need for ophtho referral if not resolving.

## 2017-03-03 NOTE — Progress Notes (Signed)
BP 120/78   Pulse 83   Temp 98.1 F (36.7 C)   Ht 5' 3.5" (1.613 m)   Wt 155 lb (70.3 kg)   SpO2 97%   BMI 27.03 kg/m    CC: "I have pink eye" Subjective:    Patient ID: Cassandra Benson, female    DOB: 1941/03/09, 76 y.o.   MRN: [id number]  HPI: Cassandra Benson is a 76 y.o. female presenting on 03/03/2017 for Acute Visit (? pink eye)   1 wk ho L eye swelling along with red bump lower eyelid. Some soreness noted with blinking as well as bone feels sore. Conjunctiva also red. No symptoms of right eye. She also has headache. She has been using cool compresses.   No sick contacts at home.  No fevers, cough, congestion.   Relevant past medical, surgical, family and social history reviewed and updated as indicated. Interim medical history since our last visit reviewed. Allergies and medications reviewed and updated. Outpatient Medications Prior to Visit  Medication Sig Dispense Refill  . aspirin EC 81 MG tablet Take 81 mg by mouth daily.    . BD ULTRA-FINE LANCETS lancets Use as instructed upto 6 times daily 600 each 2  . Cholecalciferol 50000 UNITS TABS Take 25,000 mg by mouth every 14 (fourteen) days. Take one tablet by mouth every 14 days (2 weeks)    . Cyanocobalamin (B-12 SL) Place 1 drop under the tongue daily.     Marland Kitchen glucagon (GLUCAGON EMERGENCY) 1 MG injection Inject 1 mg into the muscle once as needed. 1 each 12  . ibuprofen (ADVIL,MOTRIN) 600 MG tablet Take 1 tablet (600 mg total) by mouth every 8 (eight) hours as needed. 30 tablet 0  . insulin aspart (NOVOLOG) 100 UNIT/ML injection Use in an insulin pump upto 50 units per day 2 vial 6  . Lecithin 400 MG CAPS Take 1 capsule by mouth once a week.     Marland Kitchen lisinopril (PRINIVIL,ZESTRIL) 10 MG tablet take 1 tablet by mouth once daily (Patient taking differently: take 1/2 tablet.) 30 tablet 6  . metFORMIN (GLUCOPHAGE-XR) 500 MG 24 hr tablet Take 1 tablet (500 mg total) by mouth 3 (three) times daily. 270 tablet 3  . pantoprazole  (PROTONIX) 40 MG tablet Take 1 tablet (40 mg total) by mouth daily. 30 tablet 1  . rosuvastatin (CRESTOR) 5 MG tablet Take 1 tablet (5 mg total) by mouth once a week. 30 tablet 3  . SYNTHROID 75 MCG tablet Take by mouth 75 mcg daily 1 tab 6/7 days and 1.5 tabs 1/7 days. 90 tablet 3   No facility-administered medications prior to visit.      Per HPI unless specifically indicated in ROS section below Review of Systems     Objective:    BP 120/78   Pulse 83   Temp 98.1 F (36.7 C)   Ht 5' 3.5" (1.613 m)   Wt 155 lb (70.3 kg)   SpO2 97%   BMI 27.03 kg/m   Wt Readings from Last 3 Encounters:  03/03/17 155 lb (70.3 kg)  02/13/17 155 lb (70.3 kg)  01/04/17 153 lb (69.4 kg)    Physical Exam  Constitutional: She appears well-developed and well-nourished. No distress.  HENT:  Head: Normocephalic and atraumatic.  Eyes: Conjunctivae and EOM are normal. Pupils are equal, round, and reactive to light. Right eye exhibits no discharge. Left eye exhibits no discharge. No scleral icterus.  EOMI R eye WNL L lower eyelid with erythema  and swelling inner eyelid, palpebral conjunctival injection, tender to palpation around lower eye  Nursing note and vitals reviewed.     Assessment & Plan:   Problem List Items Addressed This Visit    Internal hordeolum of left eye - Primary    Anticipate internal stye - discussed with patient. Treat with warm compresses, lubricating eye drops, and cipro OP eye drops (sulfa allergy ?tmp).  Update if not improving with treatment, discussed possible need for ophtho referral if not resolving.           Follow up plan: Return if symptoms worsen or fail to improve.  [name], MD

## 2017-03-03 NOTE — Patient Instructions (Addendum)
I do think you have inflammation of inner eyelid on left, possible developing stye. Treat with warm compresses, and cipro eye drops. If ongoing irritation you can add OTC lubricating eye drops between cipro drops.  Let us know if not improving in 1-2 wks for eye doctor referral.

## 2017-03-10 ENCOUNTER — Encounter: Payer: Self-pay | Admitting: Pharmacist

## 2017-03-10 ENCOUNTER — Other Ambulatory Visit: Payer: Self-pay | Admitting: Pharmacist

## 2017-03-10 NOTE — Patient Outreach (Signed)
[place] [facility]) Care Management  03/10/2017  Cassandra Benson Oct 12, 1940 [id number]  Successful phone outreach to patient---patient reports she misplaced [employer] patient assistance application.  She reports she would still like to apply to see if she is eligible for assistance for Novolog.  Advised patient, Dr Cruzita Lederer sent prescriber portion of application back.    Patient requests a new copy of Novo Nordisk application be mailed to her---address verified in chart was correct per patient.   Patient reminded application will require proof of household income and proof of spending at least $1,000 out-of-pocket on prescriptions.  She mentioned paying $85 for refill of Glucophage XR unfortunately, there doesn't appear to be manufacturer assistance for Glucophage XR.    Plan:  Will send patient [employer] Patient Media planner.   Will continue to follow-up with patient regarding patient assistance application process.   Karrie Meres, PharmD, [place] [phone]

## 2017-03-13 ENCOUNTER — Other Ambulatory Visit: Payer: Self-pay | Admitting: Internal Medicine

## 2017-03-13 ENCOUNTER — Telehealth: Payer: Self-pay | Admitting: Internal Medicine

## 2017-03-13 ENCOUNTER — Encounter: Payer: Self-pay | Admitting: Internal Medicine

## 2017-03-13 ENCOUNTER — Ambulatory Visit (INDEPENDENT_AMBULATORY_CARE_PROVIDER_SITE_OTHER): Payer: PPO | Admitting: Internal Medicine

## 2017-03-13 ENCOUNTER — Ambulatory Visit (HOSPITAL_COMMUNITY)
Admission: RE | Admit: 2017-03-13 | Discharge: 2017-03-13 | Disposition: A | Discharge status: Home / Self Care | Payer: PPO | Source: Ambulatory Visit | Attending: Internal Medicine | Admitting: Internal Medicine

## 2017-03-13 ENCOUNTER — Telehealth: Payer: Self-pay | Admitting: Family Medicine

## 2017-03-13 VITALS — BP 118/68 | HR 75 | Temp 97.6°F | Resp 16 | Ht 63.5 in | Wt 157.0 lb

## 2017-03-13 DIAGNOSIS — R519 Headache, unspecified: Secondary | ICD-10-CM | POA: Insufficient documentation

## 2017-03-13 DIAGNOSIS — R202 Paresthesia of skin: Secondary | ICD-10-CM

## 2017-03-13 DIAGNOSIS — R200 Anesthesia of skin: Secondary | ICD-10-CM

## 2017-03-13 DIAGNOSIS — H4922 Sixth [abducent] nerve palsy, left eye: Secondary | ICD-10-CM | POA: Diagnosis not present

## 2017-03-13 DIAGNOSIS — R51 Headache: Secondary | ICD-10-CM

## 2017-03-13 DIAGNOSIS — G8929 Other chronic pain: Secondary | ICD-10-CM | POA: Insufficient documentation

## 2017-03-13 DIAGNOSIS — E139 Other specified diabetes mellitus without complications: Secondary | ICD-10-CM

## 2017-03-13 NOTE — Telephone Encounter (Signed)
Stanton Kidney is currently working on this.

## 2017-03-13 NOTE — Telephone Encounter (Signed)
Pt has appt 03/13/17 at 10:30 with Dr Scarlette Calico.

## 2017-03-13 NOTE — Patient Instructions (Signed)

## 2017-03-13 NOTE — Telephone Encounter (Signed)
CRITICAL VALUE STICKER  CRITICAL VALUE: Normal Brain MRI  RECEIVER (on-site recipient of call): [place] NOTIFIED: 03/13/2017 @ 4:50pm   MESSENGER (representative from lab): Dr. Dorann Lodge from [facility] Radiology  MD NOTIFIED: Yes  TIME OF NOTIFICATION: 4:50pm  RESPONSE:

## 2017-03-13 NOTE — Telephone Encounter (Signed)
Called stating the precert needs to be changed to [place] instead of [facility]    Pt has appt today

## 2017-03-13 NOTE — Telephone Encounter (Signed)
Patient Name: Cassandra Benson DOB: 1940-10-12 Initial Comment Caller States she is having a headache that goes down into her neck Nurse Assessment Nurse: Rock Nephew, RN, Juliann Pulse Date/Time (Eastern Time): 03/13/2017 8:40:20 AM Confirm and document reason for call. If symptomatic, describe symptoms. ---Caller states she is having a headache that goes down into her neck and has been present for 5 days . Does the patient have any new or worsening symptoms? ---Yes Will a triage be completed? ---Yes Related visit to physician within the last 2 weeks? ---No Does the PT have any chronic conditions? (i.e. diabetes, asthma, etc.) ---Yes List chronic conditions. ---Diabetes, + ANA titer Is this a behavioral health or substance abuse call? ---No Guidelines Guideline Title Affirmed Question Affirmed Notes Headache [1] MODERATE headache (e.g., interferes with normal activities) AND [2] present > 24 hours AND [3] unexplained (Exceptions: analgesics not tried, typical migraine, or headache part of viral illness) Final Disposition User See Physician within [facility] [place], RN, Juliann Pulse Comments Appt scheduled for patient for today at the [facility] office with Dr. Ronnald Ramp at 1030 am . Patient verbalized understanding . Referrals REFERRED TO PCP OFFICE Disagree/Comply: Comply

## 2017-03-13 NOTE — Progress Notes (Signed)
Subjective:  Patient ID: Cassandra Benson, female    DOB: 1941/08/04  Age: 76 y.o. MRN: [id number]  CC: Headache  NEW TO ME  HPI JHOSELIN CRUME presents for A six-day history of posterior midline headache that she describes as a blunt and penetrating sensation that radiates into her upper neck. The pain in the neck is described as a jabbing sensation. She's had nausea but no vomiting. She complains of ringing in her ears, mild ataxia, fatigue, and intermittent numbness and tingling throughout her face and extremities. She denies any visual disturbance or changes in her hearing. Her sister recently died from complications of a CNS aneurysm.  Outpatient Medications Prior to Visit  Medication Sig Dispense Refill  . aspirin EC 81 MG tablet Take 81 mg by mouth daily.    . BD ULTRA-FINE LANCETS lancets Use as instructed upto 6 times daily 600 each 2  . Cholecalciferol 50000 UNITS TABS Take 25,000 mg by mouth every 14 (fourteen) days. Take one tablet by mouth every 14 days (2 weeks)    . ciprofloxacin (CILOXAN) 0.3 % ophthalmic solution Place 1 drop into the left eye every 4 (four) hours while awake. 5 mL 0  . Cyanocobalamin (B-12 SL) Place 1 drop under the tongue daily.     Marland Kitchen glucagon (GLUCAGON EMERGENCY) 1 MG injection Inject 1 mg into the muscle once as needed. 1 each 12  . ibuprofen (ADVIL,MOTRIN) 600 MG tablet Take 1 tablet (600 mg total) by mouth every 8 (eight) hours as needed. 30 tablet 0  . insulin aspart (NOVOLOG) 100 UNIT/ML injection Use in an insulin pump upto 50 units per day 2 vial 6  . Lecithin 400 MG CAPS Take 1 capsule by mouth once a week.     Marland Kitchen lisinopril (PRINIVIL,ZESTRIL) 10 MG tablet take 1 tablet by mouth once daily (Patient taking differently: take 1/2 tablet.) 30 tablet 6  . metFORMIN (GLUCOPHAGE-XR) 500 MG 24 hr tablet Take 1 tablet (500 mg total) by mouth 3 (three) times daily. 270 tablet 3  . pantoprazole (PROTONIX) 40 MG tablet Take 1 tablet (40 mg total) by mouth  daily. 30 tablet 1  . rosuvastatin (CRESTOR) 5 MG tablet Take 1 tablet (5 mg total) by mouth once a week. 30 tablet 3  . SYNTHROID 75 MCG tablet Take by mouth 75 mcg daily 1 tab 6/7 days and 1.5 tabs 1/7 days. 90 tablet 3   No facility-administered medications prior to visit.     ROS Review of Systems  Constitutional: Positive for fatigue. Negative for activity change, appetite change, chills and fever.  HENT: Positive for tinnitus. Negative for hearing loss, sore throat, trouble swallowing and voice change.   Eyes: Negative.  Negative for photophobia, pain, redness and visual disturbance.  Respiratory: Negative.  Negative for cough and shortness of breath.   Cardiovascular: Negative.  Negative for chest pain, palpitations and leg swelling.  Gastrointestinal: Positive for nausea. Negative for abdominal pain, diarrhea and vomiting.  Endocrine: Negative.   Genitourinary: Negative.  Negative for difficulty urinating.  Musculoskeletal: Positive for gait problem and neck pain. Negative for arthralgias, back pain and myalgias.  Allergic/Immunologic: Negative.   Neurological: Positive for numbness and headaches. Negative for dizziness, seizures, syncope, facial asymmetry, speech difficulty, weakness and light-headedness.  Hematological: Negative for adenopathy. Does not bruise/bleed easily.  Psychiatric/Behavioral: Negative.     Objective:  BP 118/68 (BP Location: Left Arm, Patient Position: Sitting, Cuff Size: Normal)   Pulse 75   Temp 97.6 F (  36.4 C) (Oral)   Resp 16   Ht 5' 3.5" (1.613 m)   Wt 157 lb (71.2 kg)   SpO2 98%   BMI 27.38 kg/m   BP Readings from Last 3 Encounters:  03/13/17 118/68  03/03/17 120/78  02/13/17 120/72    Wt Readings from Last 3 Encounters:  03/13/17 157 lb (71.2 kg)  03/03/17 155 lb (70.3 kg)  02/13/17 155 lb (70.3 kg)    Physical Exam  Constitutional: She is oriented to person, place, and time. No distress.  HENT:  Mouth/Throat: Oropharyngeal  exudate present.  Eyes: Conjunctivae are normal. Pupils are equal, round, and reactive to light. Right eye exhibits no discharge. Left eye exhibits no discharge. No scleral icterus. Right eye exhibits normal extraocular motion and no nystagmus. Left eye exhibits abnormal extraocular motion. Left eye exhibits no nystagmus.  Left eye does not fully deviate laterally  Neck: Normal range of motion. Neck supple. No JVD present. No thyromegaly present.  Cardiovascular: Normal rate, regular rhythm and intact distal pulses.  Exam reveals no gallop.   No murmur heard. Pulmonary/Chest: Effort normal and breath sounds normal. No respiratory distress. She has no wheezes. She has no rales. She exhibits no tenderness.  Abdominal: Soft. Bowel sounds are normal. She exhibits no distension and no mass. There is no tenderness. There is no rebound and no guarding.  Musculoskeletal: Normal range of motion. She exhibits no edema or tenderness.  Lymphadenopathy:    She has no cervical adenopathy.  Neurological: She is alert and oriented to person, place, and time. She displays no atrophy, no tremor and normal reflexes. No cranial nerve deficit or sensory deficit. She exhibits normal muscle tone. She displays no seizure activity. Coordination abnormal. Gait normal. She displays no Babinski's sign on the right side. She displays no Babinski's sign on the left side.  Reflex Scores:      Tricep reflexes are 0 on the right side and 0 on the left side.      Bicep reflexes are 0 on the right side and 0 on the left side.      Brachioradialis reflexes are 0 on the right side and 0 on the left side.      Patellar reflexes are 0 on the right side and 0 on the left side.      Achilles reflexes are 0 on the right side and 0 on the left side. She has a mildly positive Romberg test  Skin: Skin is warm and dry. No rash noted. She is not diaphoretic. No erythema. No pallor.  Psychiatric: She has a normal mood and affect. Her behavior  is normal. Judgment and thought content normal.  Vitals reviewed.   Lab Results  Component Value Date   WBC 11.6 (H) 12/25/2016   HGB 13.6 12/25/2016   HCT 41.0 12/25/2016   PLT 262 12/25/2016   GLUCOSE 145 (H) 12/25/2016   CHOL 245 (H) 08/09/2016   TRIG 258.0 (H) 08/09/2016   HDL 41.80 08/09/2016   LDLDIRECT 177.0 08/09/2016   LDLCALC 160 01/11/2016   ALT 21 01/11/2016   AST 24 01/11/2016   NA 139 12/25/2016   K 4.5 12/25/2016   CL 105 12/25/2016   CREATININE 0.86 12/25/2016   BUN 18 12/25/2016   CO2 29 12/25/2016   TSH 0.58 01/04/2017   HGBA1C 6.77f 01/04/2017   MICROALBUR 0.1 10/23/2014    US Abdomen Limited Ruq  Result Date: 02/09/2017 CLINICAL DATA:  Right upper quadrant pain off and on for  several months EXAM: US ABDOMEN LIMITED - RIGHT UPPER QUADRANT COMPARISON:  None. FINDINGS: Gallbladder: No gallstones or wall thickening visualized. No sonographic Murphy sign noted by sonographer. Common bile duct: Diameter: 4 mm. Liver: Mildly increased in echogenicity consistent with fatty infiltration. No focal lesion is seen. IMPRESSION: Fatty liver. No other focal abnormality is noted. Electronically Signed   By: Inez Catalina M.D.   On: 02/09/2017 09:39    Assessment & Plan:   Magdala was seen today for headache.  Diagnoses and all orders for this visit:  Intractable episodic headache, unspecified headache type- I have ordered a stat MRI to screen for NPH, CVA, mass, aneurysm, ICH -     MR BRAIN WO CONTRAST; Future  Numbness and tingling of both lower extremities- as above -     MR BRAIN WO CONTRAST; Future  6th nerve palsy, left- as above -     MR BRAIN WO CONTRAST; Future   I am having Ms. Boak maintain her Cholecalciferol, Cyanocobalamin (B-12 SL), BD ULTRA-FINE LANCETS, Lecithin, aspirin EC, ibuprofen, glucagon, insulin aspart, pantoprazole, lisinopril, metFORMIN, SYNTHROID, rosuvastatin, and ciprofloxacin.  No orders of the defined types were placed in this  encounter.    Follow-up: No Follow-up on file.  Scarlette Calico, MD

## 2017-03-16 ENCOUNTER — Ambulatory Visit (INDEPENDENT_AMBULATORY_CARE_PROVIDER_SITE_OTHER): Payer: PPO | Admitting: Family Medicine

## 2017-03-16 ENCOUNTER — Encounter: Payer: Self-pay | Admitting: Family Medicine

## 2017-03-16 ENCOUNTER — Other Ambulatory Visit (INDEPENDENT_AMBULATORY_CARE_PROVIDER_SITE_OTHER): Payer: PPO

## 2017-03-16 ENCOUNTER — Encounter: Payer: Self-pay | Admitting: Internal Medicine

## 2017-03-16 ENCOUNTER — Ambulatory Visit (INDEPENDENT_AMBULATORY_CARE_PROVIDER_SITE_OTHER)
Admission: RE | Admit: 2017-03-16 | Discharge: 2017-03-16 | Disposition: A | Discharge status: Home / Self Care | Payer: PPO | Source: Ambulatory Visit | Attending: Family Medicine | Admitting: Family Medicine

## 2017-03-16 VITALS — BP 124/82 | HR 84 | Temp 97.7°F | Ht 63.5 in | Wt 156.0 lb

## 2017-03-16 DIAGNOSIS — M542 Cervicalgia: Secondary | ICD-10-CM

## 2017-03-16 DIAGNOSIS — E139 Other specified diabetes mellitus without complications: Secondary | ICD-10-CM

## 2017-03-16 DIAGNOSIS — R51 Headache: Secondary | ICD-10-CM

## 2017-03-16 DIAGNOSIS — R519 Headache, unspecified: Secondary | ICD-10-CM

## 2017-03-16 DIAGNOSIS — H4922 Sixth [abducent] nerve palsy, left eye: Secondary | ICD-10-CM

## 2017-03-16 DIAGNOSIS — E109 Type 1 diabetes mellitus without complications: Secondary | ICD-10-CM

## 2017-03-16 LAB — CBC WITH DIFFERENTIAL/PLATELET
BASOS PCT: 1.2 % (ref 0.0–3.0)
Basophils Absolute: 0.1 10*3/uL (ref 0.0–0.1)
EOS ABS: 0.3 10*3/uL (ref 0.0–0.7)
Eosinophils Relative: 3.4 % (ref 0.0–5.0)
HCT: 42.3 % (ref 36.0–46.0)
HEMOGLOBIN: 13.7 g/dL (ref 12.0–15.0)
LYMPHS ABS: 2.4 10*3/uL (ref 0.7–4.0)
Lymphocytes Relative: 28.1 % (ref 12.0–46.0)
MCHC: 32.5 g/dL (ref 30.0–36.0)
MCV: 80.2 fl (ref 78.0–100.0)
MONO ABS: 0.5 10*3/uL (ref 0.1–1.0)
Monocytes Relative: 5.9 % (ref 3.0–12.0)
NEUTROS ABS: 5.3 10*3/uL (ref 1.4–7.7)
Neutrophils Relative %: 61.4 % (ref 43.0–77.0)
PLATELETS: 237 10*3/uL (ref 150.0–400.0)
RBC: 5.27 Mil/uL — ABNORMAL HIGH (ref 3.87–5.11)
RDW: 14.7 % (ref 11.5–15.5)
WBC: 8.6 10*3/uL (ref 4.0–10.5)

## 2017-03-16 LAB — COMPREHENSIVE METABOLIC PANEL
ALT: 13 U/L (ref 0–35)
AST: 13 U/L (ref 0–37)
Albumin: 3.9 g/dL (ref 3.5–5.2)
Alkaline Phosphatase: 60 U/L (ref 39–117)
BUN: 14 mg/dL (ref 6–23)
CO2: 27 meq/L (ref 19–32)
CREATININE: 0.92 mg/dL (ref 0.40–1.20)
Calcium: 9.6 mg/dL (ref 8.4–10.5)
Chloride: 108 mEq/L (ref 96–112)
GFR: 63.3 mL/min (ref >60.00)
GLUCOSE: 131 mg/dL — AB (ref 70–99)
Potassium: 3.8 mEq/L (ref 3.5–5.1)
SODIUM: 141 meq/L (ref 135–145)
Total Bilirubin: 0.2 mg/dL (ref 0.2–1.2)
Total Protein: 6.4 g/dL (ref 6.0–8.3)

## 2017-03-16 LAB — SEDIMENTATION RATE: SED RATE: 21 mm/h (ref 0–30)

## 2017-03-16 LAB — HEMOGLOBIN A1C: HEMOGLOBIN A1C: 7.9 % — AB (ref 4.6–6.5)

## 2017-03-16 MED ORDER — GABAPENTIN 100 MG PO CAPS: 100.0000 mg | ORAL_CAPSULE | Freq: Two times a day (BID) | ORAL | 1 refills | Status: DC

## 2017-03-16 NOTE — Progress Notes (Signed)
BP 124/82   Pulse 84   Temp 97.7 F (36.5 C)   Ht 5' 3.5" (1.613 m)   Wt 156 lb (70.8 kg)   SpO2 97%   BMI 27.20 kg/m    CC: f/u headache Subjective:    Patient ID: [name], female    DOB: 01-23-1941, 76 y.o.   MRN: [id number]  HPI: ACHOL AZPEITIA is a 76 y.o. female presenting on 03/16/2017 for Acute Visit (headaches-- MRI on Monday @ [facility])   Saw Dr Ronnald Ramp earlier this week with 6d h/o intractable headache. Note reviewed. She was found to have 6th nerve palsy and family history of brain aneurysm. MRI was ordered and reassuringly unrevealing. Planned further labwork ordered.   Posterior headache started last Thursday associated with facial tingling/numbness throughout entire face. Numbness resolved. Pain described as throbbing and burning pain posterior skull with radiation down neck. Painful to lay head on pillow. Worse pain with sudden head movements. Some nausea. No pain at temple. No recent tick bites.  Denies falls/injury to head.  Denies shooting pain down arm or paresthesias of arm  Denies fevers/chills, vision changes, slurred speech, congestion, rhinorrhea, or sore throat. No ear or tooth pain. abd pain. Not associated with photo/phonophobia.  Tried advil 600mg  without improvement.  This feels similar to mononucleosis associated headache she had in her 25s. Was hospitalized for this.   She wondered if tablet use related headache- but no better with stopping tablet use last few days.  Last vision screen 11/2016.   Relevant past medical, surgical, family and social history reviewed and updated as indicated. Interim medical history since our last visit reviewed. Allergies and medications reviewed and updated. Outpatient Medications Prior to Visit  Medication Sig Dispense Refill  . aspirin EC 81 MG tablet Take 81 mg by mouth daily.    . BD ULTRA-FINE LANCETS lancets Use as instructed upto 6 times daily 600 each 2  . Cholecalciferol 50000 UNITS TABS Take 25,000 mg  by mouth every 14 (fourteen) days. Take one tablet by mouth every 14 days (2 weeks)    . ciprofloxacin (CILOXAN) 0.3 % ophthalmic solution Place 1 drop into the left eye every 4 (four) hours while awake. 5 mL 0  . Cyanocobalamin (B-12 SL) Place 1 drop under the tongue daily.     Marland Kitchen glucagon (GLUCAGON EMERGENCY) 1 MG injection Inject 1 mg into the muscle once as needed. 1 each 12  . ibuprofen (ADVIL,MOTRIN) 600 MG tablet Take 1 tablet (600 mg total) by mouth every 8 (eight) hours as needed. 30 tablet 0  . insulin aspart (NOVOLOG) 100 UNIT/ML injection Use in an insulin pump upto 50 units per day 2 vial 6  . Lecithin 400 MG CAPS Take 1 capsule by mouth once a week.     Marland Kitchen lisinopril (PRINIVIL,ZESTRIL) 10 MG tablet take 1 tablet by mouth once daily (Patient taking differently: take 1/2 tablet.) 30 tablet 6  . metFORMIN (GLUCOPHAGE-XR) 500 MG 24 hr tablet Take 1 tablet (500 mg total) by mouth 3 (three) times daily. 270 tablet 3  . pantoprazole (PROTONIX) 40 MG tablet Take 1 tablet (40 mg total) by mouth daily. 30 tablet 1  . rosuvastatin (CRESTOR) 5 MG tablet Take 1 tablet (5 mg total) by mouth once a week. 30 tablet 3  . SYNTHROID 75 MCG tablet Take by mouth 75 mcg daily 1 tab 6/7 days and 1.5 tabs 1/7 days. 90 tablet 3   No facility-administered medications prior to visit.  Per HPI unless specifically indicated in ROS section below Review of Systems     Objective:    BP 124/82   Pulse 84   Temp 97.7 F (36.5 C)   Ht 5' 3.5" (1.613 m)   Wt 156 lb (70.8 kg)   SpO2 97%   BMI 27.20 kg/m   Wt Readings from Last 3 Encounters:  03/16/17 156 lb (70.8 kg)  03/13/17 157 lb (71.2 kg)  03/03/17 155 lb (70.3 kg)    Physical Exam  Constitutional: She is oriented to person, place, and time. She appears well-developed and well-nourished. No distress.  HENT:  Head: Normocephalic and atraumatic.  Right Ear: Hearing, tympanic membrane, external ear and ear canal normal.  Left Ear: Hearing,  tympanic membrane, external ear and ear canal normal.  Nose: Right sinus exhibits no maxillary sinus tenderness and no frontal sinus tenderness. Left sinus exhibits no maxillary sinus tenderness and no frontal sinus tenderness.  Mouth/Throat: Uvula is midline, oropharynx is clear and moist and mucous membranes are normal. No oropharyngeal exudate, posterior oropharyngeal edema, posterior oropharyngeal erythema or tonsillar abscesses.  Mild discomfort to palpation bilateral temples  Eyes: Conjunctivae and EOM are normal. Pupils are equal, round, and reactive to light. No scleral icterus.  Neck: Normal range of motion. Neck supple.  Cardiovascular: Normal rate, regular rhythm, normal heart sounds and intact distal pulses.   No murmur heard. Pulmonary/Chest: Effort normal and breath sounds normal. No respiratory distress. She has no wheezes. She has no rales.  Musculoskeletal: She exhibits no edema.  FROM at neck Tender to palpation along entire cervical spine midline without significant paracervical mm tenderness  Lymphadenopathy:    She has no cervical adenopathy.  Neurological: She is alert and oriented to person, place, and time. She has normal strength. No cranial nerve deficit or sensory deficit.  CN 2-12 intact EOMI Neg spurling  Skin: Skin is warm and dry. No rash noted. No erythema.  Possibly drying rash L posterior scalp  Psychiatric: She has a normal mood and affect.  Nursing note and vitals reviewed.   MRI HEAD WITHOUT CONTRAST TECHNIQUE: Multiplanar, multiecho pulse sequences of the brain and surrounding structures were obtained without intravenous contrast. COMPARISON:  None. FINDINGS: BRAIN: No reduced diffusion to suggest acute ischemia or hyperacute demyelination . No susceptibility artifact to suggest hemorrhage. The ventricles and sulci are normal for patient's age. A few scattered subcentimeter supratentorial and pontine white matter FLAIR T2 hyperintensities most  commonly seen with chronic small vessel ischemic disease are less than expected for age. No suspicious parenchymal signal, masses or mass effect. No abnormal extra-axial fluid collections. VASCULAR: Normal major intracranial vascular flow voids present at skull base. SKULL AND UPPER CERVICAL SPINE: No abnormal sellar expansion. No suspicious calvarial bone marrow signal. Craniocervical junction maintained. SINUSES/ORBITS: Small LEFT maxillary mucosal retention cysts without paranasal sinus air-fluid levels. The included ocular globes and orbital contents are non-suspicious. Status post bilateral ocular lens implants. OTHER: None. IMPRESSION: Negative noncontrast MRI of the head for age. Resolved provided Dr. Marcello Moores Jone's answering service on March 13, 2017 at [place] hours by interpreting radiologist. Electronically Signed   By: Elon Alas M.D.   On: 03/13/2017 16:48    Assessment & Plan:  Over 25 minutes were spent face-to-face with the patient during this encounter and >50% of that time was spent on counseling and coordination of care  Problem List Items Addressed This Visit    Intractable episodic headache - Primary    Ongoing pain despite  NSAID. MRI reassuringly normal.  ?PHN (dry rash L occipital scalp) vs cervicogenic headache.  Check cervical films today. Check labs today.  Trial gabapentin 100mg  BID with discussion on taper if needed.  Update if not improving with treatment.       Relevant Medications   gabapentin (NEURONTIN) 100 MG capsule    Other Visit Diagnoses    Cervical pain (neck)       Relevant Orders   DG Cervical Spine Complete       Follow up plan: Return if symptoms worsen or fail to improve.  [name], MD

## 2017-03-16 NOTE — Assessment & Plan Note (Signed)
Ongoing pain despite NSAID. MRI reassuringly normal.  ?PHN (dry rash L occipital scalp) vs cervicogenic headache.  Check cervical films today. Check labs today.  Trial gabapentin 100mg  BID with discussion on taper if needed.  Update if not improving with treatment.

## 2017-03-16 NOTE — Patient Instructions (Addendum)
Neck xray today. Labs today. Trial gabapentin 100mg  twice daily for pain, may increase to 200mg  twice daily if tolerated.  Let us know if not improving with above.

## 2017-03-17 ENCOUNTER — Encounter: Payer: Self-pay | Admitting: Internal Medicine

## 2017-03-17 LAB — LYME AB/WESTERN BLOT REFLEX: B burgdorferi Ab IgG+IgM: <0.9 Index (ref <0.90)

## 2017-03-17 LAB — ROCKY MTN SPOTTED FVR ABS PNL(IGG+IGM)
RMSF IGG: NOT DETECTED
RMSF IGM: NOT DETECTED

## 2017-03-18 ENCOUNTER — Encounter: Payer: Self-pay | Admitting: Family Medicine

## 2017-03-27 ENCOUNTER — Other Ambulatory Visit: Payer: Self-pay | Admitting: Pharmacist

## 2017-03-27 NOTE — Patient Outreach (Signed)
[facility]) Care Management  03/27/2017  Cassandra Benson [birth year] [id number]  Successful phone follow-up with patient regarding Novo Nordisk patient assistance application.   Patient reports she has application but has not completed it yet.  She reports she received a statement from her insurance that she hasn't spent $1,000 on prescriptions this year but she states she doesn't believe it is correct.  She expresses frustration with having to use a different blood glucometer due to her 2018 insurance plan.    She asks about insurance plan options and was counseled [facility] Pharmacist is not able to help her select a plan however she could contact [facility] [employer] to discuss options---she reports she is aware of [facility] [employer].    Plan:  Will place follow-up call to patient in the next month regarding patient assistance application status.    Karrie Meres, PharmD, [place] [phone]

## 2017-04-07 ENCOUNTER — Telehealth: Payer: Self-pay

## 2017-04-07 ENCOUNTER — Ambulatory Visit (INDEPENDENT_AMBULATORY_CARE_PROVIDER_SITE_OTHER): Payer: PPO | Admitting: Internal Medicine

## 2017-04-07 ENCOUNTER — Encounter: Payer: Self-pay | Admitting: Internal Medicine

## 2017-04-07 VITALS — BP 120/70 | HR 90 | Ht 63.5 in | Wt 156.0 lb

## 2017-04-07 DIAGNOSIS — E039 Hypothyroidism, unspecified: Secondary | ICD-10-CM | POA: Diagnosis not present

## 2017-04-07 DIAGNOSIS — E139 Other specified diabetes mellitus without complications: Secondary | ICD-10-CM

## 2017-04-07 DIAGNOSIS — E109 Type 1 diabetes mellitus without complications: Secondary | ICD-10-CM | POA: Diagnosis not present

## 2017-04-07 NOTE — Progress Notes (Signed)
Patient ID: Cassandra Benson, female   DOB: 1940-10-04, 76 y.o.   MRN: [id number]   HPI: Cassandra Benson is a 76 y.o.-year-old female, returning for f/u for LADA, initially dx'ed in 1998 (76 y/o), started insulin at dx, started insulin pump in ~2008, uncontrolled, without complications and hypothyroidism. She previously saw endocrinology at [facility] (Dr. Smith Robert) and Dr Welford Roche. Last visit with me 3 mo ago.  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  Last hemoglobin A1c was: Lab Results  Component Value Date   HGBA1C 7.9 (H) 03/16/2017   HGBA1C 6.17f 01/04/2017   HGBA1C 7.7 (H) 08/09/2016   Pt is on an insulin pump: Medtronic 723 - started in 09/2016, without CGM, uses Novolog in the pump. Started to use [place] supplies.  She is also on Glucophage ER 500 mg 3x a day. Cannot take Generic Metformin ER b/c diarrhea. Pump settings: - basal rates: 12 am: 0.800 units/h 3 am: 0.875 9:30 am: 0.100 5 pm: 1.500 6:30 pm: 1.300 11:30 pm: 1.800  (did not change...) - ICR:   12 am: 10 6 pm: 8 - target: 130-130 except 6-8 am and 6 pm-12 am: 100-100  - ISF: 35 - Insulin on Board: 4h - bolus wizard: on TDD from basal insulin: 81% (18.2 units) >> 74% >> 79% TDD from bolus insulin: 19% (4.3 units) >> 26% >> 21% - extended bolusing: not using - changes infusion site: q7 >> now q 3-4 days - Meter: Bayer Contour - not covered  Pt checks her sugars 1.4x a day and they are 129 +/- 34 >> 130 +/- 18: - am: 86-109 >> 98-130 >> 111-150, 162 - 2h after b'fast: 88-119 >> n/c  - before lunch: 95-116 >> 92-132 - 2h after lunch: 107 >> 76 >> n/c - before dinner: 97, 104, 112 >> 99-180, 202 (no bolusing with lunch) >> 86 - 2h after dinner: n/c >> 133, 222 (no bolusing with dinner) >> n/c - bedtime: n/c - nighttime:  43, 55 - midnight,204 x1 >> 92-141, 183 >> n/c No lows. Lowest sugar was 43 >> 82 at night >> 86; she has hypoglycemia awareness at 80. No prev. Hypoglycemia admission. She does have a glucagon kit at  home. Highest sugar was 222 >> 162. No prev. DKA admissions.  Pt's meals are: - Breakfast: protein drink + almond milk - Lunch: PB jelly sandwich or sandwich with ham or cream cheese sandwich - Dinner: salads  - Snacks: pretzels; pork tenderloin + veggies; chicken + tenderloin  - No CKD, last BUN/creatinine:  Lab Results  Component Value Date   BUN 14 03/16/2017   BUN 18 12/25/2016   CREATININE 0.92 03/16/2017   CREATININE 0.86 12/25/2016  On Lisinopril. - last set of lipids: Lab Results  Component Value Date   CHOL 245 (H) 08/09/2016   HDL 41.80 08/09/2016   LDLCALC 160 01/11/2016   LDLDIRECT 177.0 08/09/2016   TRIG 258.0 (H) 08/09/2016   CHOLHDL 6 08/09/2016  On Crestor 5 >> mm cramps. On CoQ10. Praluent was not covered. - last eye exam was in 10/2016 >> No DR.  - no numbness and tingling in her feet. Pain in her L big toe - occasionally. Foot exam normal by PCP: 08/15/2016.  She also has hypothyroidism: - Secondary to Hashimoto's thyroiditis - She has family history of Graves' disease in mother - was on Levoxyl >> Synthroid DAW 75 mcg daily 1 tab 6/7 days and 1.5 tabs 1/7 days. She tought Levoxyl increased her lipid  levels.  Last TSH normal: Lab Results  Component Value Date   TSH 0.58 01/04/2017   She also has SLE.  We checked her for hirsutism and acne - labs normal: Component     Latest Ref Rng & Units 09/02/2014 09/08/2014  Testosterone     3 - 41 ng/dL 59 (H)   Testosterone Free     0.0 - 4.2 pg/mL 2.1   FSH     mIU/mL  68.6  LH     mIU/mL  43.4  DHEA-SO4     7 - 177 ug/dL 49   Cortisol, Plasma     ug/dL  1.3 (Normal Dexamethasone suppression test)   Estradiol     pg/mL  24.0   Also: Component     Latest Ref Rng & Units 01/04/2017  Cortisol - AM     mcg/dL 17.6  C206 ACTH     6 - 50 pg/mL 15   Labs show no signs of adrenal insufficiency.  ROS: Constitutional: no weight gain/no weight loss, no fatigue, no subjective hyperthermia, no  subjective hypothermia Eyes: no blurry vision, no xerophthalmia ENT: no sore throat, no nodules palpated in throat, no dysphagia, no odynophagia, no hoarseness Cardiovascular: no CP/no SOB/no palpitations/no leg swelling Respiratory: no cough/no SOB/no wheezing Gastrointestinal: no N/no V/no D/no C/no acid reflux Musculoskeletal: no muscle aches/no joint aches Skin: no rashes, +resolved hair loss Neurological: no tremors/no numbness/no tingling/no dizziness, + HAs  I reviewed pt's medications, allergies, PMH, social hx, family hx, and changes were documented in the history of present illness. Otherwise, unchanged from my initial visit note.   Past Medical History:  Diagnosis Date  . Allergy   . Arthritis   . Carotid stenosis, asymptomatic 06/19/2015   7-51% RICA 02-58% LICA rpt 1 yr (01/2777)   . Diabetes mellitus without complication ([place])   . Fibromyalgia    prior PCP  . GERD (gastroesophageal reflux disease)    prior PCP  . Glaucoma    Narrow angle  . History of blood clots    DVT, in 20s, none since  . History of chicken pox   . History of diverticulitis   . History of pericarditis 1986   with hospitalization  . History of pneumonia 2014  . History of shingles   . History of UTI   . Hyperlipidemia   . Hypothyroidism   . Lupus (systemic lupus erythematosus) ([place])   . Shoulder pain left   h/o RTC tendonitis and adhesive capsulitis  . Sleep apnea    prior PCP  . Vitamin D deficiency    prior PCP   Past Surgical History:  Procedure Laterality Date  . ABDOMINAL HYSTERECTOMY  1978   fibroids and menorrhagia, ovaries remain  . [facility] normal per patient  . COLONOSCOPY WITH ESOPHAGOGASTRODUODENOSCOPY (EGD)  03/2007   2 ulcers, benign polyp, rpt 5 yrs ([place], [place])  . PARTIAL HIP ARTHROPLASTY  2013   Right hip replacement  . TONSILLECTOMY AND ADENOIDECTOMY    . VAGINAL DELIVERY     x2, no complications   Social  History   Social History  . Widowed   . Number of children: 2   Occupational History  Retired    Social History Main Topics  . Smoking status: Never Smoker  . Smokeless tobacco: Never Used  . Alcohol use No  . Drug use: No   Social History Narrative   Lives in [place] now. Recently moved from  .   No pets.   Mother of Buffie Herne.   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 aspirin EC 81 MG tablet Take 81 mg by mouth daily.    . BD ULTRA-FINE LANCETS lancets Use as instructed upto 6 times daily 600 each 2  . Cholecalciferol 50000 UNITS TABS Take 25,000 mg by mouth every 14 (fourteen) days. Take one tablet by mouth every 14 days (2 weeks)    . ciprofloxacin (CILOXAN) 0.3 % ophthalmic solution Place 1 drop into the left eye every 4 (four) hours while awake. 5 mL 0  . Cyanocobalamin (B-12 SL) Place 1 drop under the tongue daily.     Marland Kitchen gabapentin (NEURONTIN) 100 MG capsule Take 1 capsule (100 mg total) by mouth 2 (two) times daily. 60 capsule 1  . glucagon (GLUCAGON EMERGENCY) 1 MG injection Inject 1 mg into the muscle once as needed. 1 each 12  . ibuprofen (ADVIL,MOTRIN) 600 MG tablet Take 1 tablet (600 mg total) by mouth every 8 (eight) hours as needed. 30 tablet 0  . insulin aspart (NOVOLOG) 100 UNIT/ML injection Use in an insulin pump upto 50 units per day 2 vial 6  . Lecithin 400 MG CAPS Take 1 capsule by mouth once a week.     Marland Kitchen lisinopril (PRINIVIL,ZESTRIL) 10 MG tablet take 1 tablet by mouth once daily (Patient taking differently: take 1/2 tablet.) 30 tablet 6  . metFORMIN (GLUCOPHAGE-XR) 500 MG 24 hr tablet Take 1 tablet (500 mg total) by mouth 3 (three) times daily. 270  tablet 3  . pantoprazole (PROTONIX) 40 MG tablet Take 1 tablet (40 mg total) by mouth daily. 30 tablet 1  . rosuvastatin (CRESTOR) 5 MG tablet Take 1 tablet (5 mg total) by mouth once a week. 30 tablet 3  . SYNTHROID 75 MCG tablet Take by mouth 75 mcg daily 1 tab 6/7 days and 1.5 tabs 1/7 days. 90 tablet 3   No current facility-administered medications on file prior to visit.    Allergies  Allergen Reactions  . Iodinated Diagnostic Agents Other (See Comments)    Itching (severe) and chest tightness  . Codeine Nausea Only  . Influenza Vaccines     Muscle weakness; unable to walk  . Valsartan     Allergy to generic only Other reaction(s): Cough "Hacking" cough  . Erythromycin Rash and Swelling  . Penicillins Swelling and Rash    Severe Swelling  . Sulfa Antibiotics Rash   Family History  Problem Relation Age of Onset  . CAD Mother 59       MI, aortic valve issues  . COPD Mother   . Lupus Mother   . CAD Father 61       CABG x2, aortic valve replacement  . Stroke Sister   . Alcohol abuse Brother   . CAD Brother 85       MI  . Stroke Brother   . Seizures Son   . COPD Brother   . CAD Brother 55       stent  . Diabetes Brother   . Depression Grandchild   . Cancer Maternal Aunt        breast  .  Breast cancer Maternal Aunt   . Diabetes Sister        deceased  . CAD Sister 23       stents  . Breast cancer Maternal Aunt    PE: BP 120/70 (BP Location: Left Arm, Patient Position: Sitting)   Pulse 90   Ht 5' 3.5" (1.613 m)   Wt 156 lb (70.8 kg)   SpO2 97%   BMI 27.20 kg/m  Wt Readings from Last 3 Encounters:  04/07/17 156 lb (70.8 kg)  03/16/17 156 lb (70.8 kg)  03/13/17 157 lb (71.2 kg)   Constitutional: overweight, in NAD Eyes: PERRLA, EOMI, no exophthalmos ENT: moist mucous membranes, no thyromegaly, no cervical lymphadenopathy Cardiovascular: RRR, No MRG Respiratory: CTA B Gastrointestinal: abdomen soft, NT, ND, BS+ Musculoskeletal: no deformities, strength  intact in all 4 Skin: moist, warm, no rashes Neurological: no tremor with outstretched hands, DTR normal in all 4  ASSESSMENT: 1. DM1, uncontrolled, without complications  2. Hypothyroidism  PLAN:  1. Patient with long-standing, uncontrolled LADA, On insulin pump therapy and metformin. She is getting more for insulin from her basal rates, without relying too much on bolusing. Her sugars were higher in the last 3 months, as she has not been feeling well, however, per review of her pump downloads, they have improved in the last month. Her sugars in a.m. Are still high, due to snacking at night, which is working on. - She has a higher basal rate for a short period of time at night, and she did not decrease it per our discussion at last visit. She believes that she forgot to do this. I advised her to decrease this rate again. - she is doing a better job changing her pump site every 3-4 days - No other changes are necessary in her pump settings. We'll continue with metformin. - I advised her to:  Patient Instructions  Please change the pump settings as follows: - basal rates: 12 am: 0.800 units/h 3 am: 0.875 9:30 am: 0.100 5 pm: 1.500 6:30 pm: 1.300 11:30 pm: 1.800 >> 1.400 - ICR:   12 am: 10 6 pm: 8 - target: 130-130 except 6-8 am and 6 pm-12 am: 100-100  - ISF: 35 - Insulin on Board: 4h  Please continue Metformin XR 500 mg 3x a day.  Please return in 3 months.  - today, HbA1c is 7%  - continue checking sugars at different times of the day - check 3x a day, rotating checks. I strongly advised her to start checking sugars later in the day, also - advised for yearly eye exams >> she is UTD - Return to clinic in 3 mo with sugar log   2. Hypothyroidism - latest thyroid labs reviewed with pt >> normal  - Since last visit, we changed from Levoxyl to Synthroid d.a.w. 75 mcg daily 1 tab 6/7 days and 1.5 tabs 1/7 days. She is feeling better on this and feels that her hair loss has  improved - we discussed about taking the thyroid hormone every day, with water, >30 minutes before breakfast, separated by >4 hours from acid reflux medications, calcium, iron, multivitamins. Pt. is taking it correctly - we will repeat her TFTs at next visit  - time spent with the patient: 40 min, of which >50% was spent in reviewing her pump downloads, discussing her hypo- and hyper-glycemic episodes, reviewing previous labs and pump settings and developing a plan to avoid hypo- and hyper-glycemia. We also addressed her hypothyroidism.   Salena Saner  Cruzita Lederer, MD PhD [facility]

## 2017-04-07 NOTE — Telephone Encounter (Signed)
Called to notify patient that I had her bayer contour meter that goes with her pump at the office. Patient did not answer, will attempt patient again to notify her.

## 2017-04-07 NOTE — Patient Instructions (Signed)
Please change the pump settings as follows: - basal rates: 12 am: 0.800 units/h 3 am: 0.875 9:30 am: 0.100 5 pm: 1.500 6:30 pm: 1.300 11:30 pm: 1.800 >> 1.400 - ICR:   12 am: 10 6 pm: 8 - target: 130-130 except 6-8 am and 6 pm-12 am: 100-100  - ISF: 35 - Insulin on Board: 4h  Please continue Metformin XR 500 mg 3x a day.  Please continue Synthroid 75 mcg daily 1 tab 6/7 days and 1.5 tabs 1/7 days.  Take the thyroid hormone every day, with water, at least 30 minutes before breakfast, separated by at least 4 hours from: - acid reflux medications - calcium - iron - multivitamins  Please return in 3 months.

## 2017-04-10 ENCOUNTER — Telehealth: Payer: Self-pay

## 2017-04-10 NOTE — Telephone Encounter (Signed)
Called to notify patient that her Cassandra Benson that goes with her pump at the office from her last appointment. Patient states she had a back up and used that and would try to come back in town to pick this one up. I have it on my desk with her name on it.

## 2017-04-17 ENCOUNTER — Other Ambulatory Visit: Payer: Self-pay | Admitting: Pharmacist

## 2017-04-17 NOTE — Patient Outreach (Signed)
[facility]) Care Management  04/17/2017  Cassandra Benson 08/01/1941 [id number]  Successful phone outreach to patient.  Patient reports she has [employer] patient assistance application but has not filled it out yet.  She reports she doesn't believe her insurance statement is correct with regards to how much she has paid for prescriptions in calendar year 2018.   She reports she plans to have her pharmacy provide her a printout.   Patient is aware of patient assistance program requirement for Medicare beneficiaries to spend at least $1,000 on prescriptions in calendar year.    Patient denies other pharmacy needs at this time and is aware she needs to complete application and meet manufacturer patient assistance program requirements.   Plan:  Will close pharmacy episode at this time.   Patient agrees to contact [facility] Pharmacist if she has questions about [employer] patient assistance application  She was counseled can mail completed application back to [facility] if/when she meet manufacturer patient assistance program requirements.   Will update Dr Cruzita Lederer.    Karrie Meres, PharmD, [place] [phone]

## 2017-05-24 ENCOUNTER — Telehealth: Payer: Self-pay | Admitting: Family Medicine

## 2017-05-24 ENCOUNTER — Other Ambulatory Visit: Payer: Self-pay | Admitting: Family Medicine

## 2017-05-24 NOTE — Telephone Encounter (Signed)
Patient Name: Cassandra Benson  DOB: 1941/07/09    Initial Comment Caller states c/o feeling off balance.    Nurse Assessment  Nurse: Leilani Merl, RN, [place] Date/Time (Eastern Time): 05/24/2017 2:19:41 PM  Confirm and document reason for call. If symptomatic, describe symptoms. ---Caller states that when she stands up she has felt off balance 2 times today, she is able to get it to go away if she stands up and stands still for a second.  Does the patient have any new or worsening symptoms? ---Yes  Will a triage be completed? ---Yes  Related visit to physician within the last 2 weeks? ---No  Does the PT have any chronic conditions? (i.e. diabetes, asthma, etc.) ---Yes  List chronic conditions. ---See MR  Is this a behavioral health or substance abuse call? ---No     Guidelines    Guideline Title Affirmed Question Affirmed Notes  Dizziness - Vertigo [1] MODERATE dizziness (e.g., vertigo; feels very unsteady, interferes with normal activities) AND [2] has NOT been evaluated by physician for this    Final Disposition User   See Physician within 24 Hours [place], RN, Water quality scientist    Comments  Appt for tomorrow at 1015 with Dr. Darnell Level.   Referrals  REFERRED TO PCP OFFICE   Disagree/Comply: Comply

## 2017-05-24 NOTE — Telephone Encounter (Signed)
Pt has appt with Dr Darnell Level on 05/25/17 at 10:15.

## 2017-05-24 NOTE — Telephone Encounter (Signed)
Will see tomorrow

## 2017-05-25 ENCOUNTER — Encounter: Payer: Self-pay | Admitting: Family Medicine

## 2017-05-25 ENCOUNTER — Ambulatory Visit (INDEPENDENT_AMBULATORY_CARE_PROVIDER_SITE_OTHER): Payer: PPO | Admitting: Family Medicine

## 2017-05-25 VITALS — BP 128/62 | HR 79 | Temp 97.5°F | Wt 153.5 lb

## 2017-05-25 DIAGNOSIS — R51 Headache: Secondary | ICD-10-CM | POA: Diagnosis not present

## 2017-05-25 DIAGNOSIS — R42 Dizziness and giddiness: Secondary | ICD-10-CM | POA: Diagnosis not present

## 2017-05-25 DIAGNOSIS — R519 Headache, unspecified: Secondary | ICD-10-CM

## 2017-05-25 MED ORDER — NORTRIPTYLINE HCL 10 MG PO CAPS: 10.0000 mg | ORAL_CAPSULE | Freq: Every day | ORAL | 3 refills | Status: DC

## 2017-05-25 MED ORDER — NORTRIPTYLINE HCL 25 MG PO CAPS: 25.0000 mg | ORAL_CAPSULE | Freq: Every day | ORAL | 3 refills | Status: DC

## 2017-05-25 NOTE — Patient Instructions (Addendum)
Stop gabapentin - and we will see if unsteadiness improves.  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  Trial low dose nortriptyline 25mg  nightly for this.  If not better with above, let us know for neurology evaluation.

## 2017-05-25 NOTE — Assessment & Plan Note (Signed)
ESR was normal 02/2017 pointing against GCA.  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  She did not tolerate gabapentin - and previously did not tolerate lyrica. I recommend trial of TCA - will Rx low dose nortriptyline 66m nightly and gage effect on headache. If no improvement, discussed neurology referral. Pt remains worried about aneurysm and tumor given fmhx - again reassured with normal MRI.

## 2017-05-25 NOTE — Progress Notes (Signed)
BP 128/62   Pulse 79   Temp (!) 97.5 F (36.4 C) (Oral)   Wt 153 lb 8 oz (69.6 kg)   SpO2 97%   BMI 26.76 kg/m    CC: dizzy Subjective:    Patient ID: Cassandra Benson, female    DOB: 12-Dec-1940, 76 y.o.   MRN: [id number]  HPI: Cassandra Benson is a 76 y.o. female presenting on 05/25/2017 for Dizziness (BS 160 this am. )   See prior note for details. Seen here 2 months ago with headache s/p evaluation with reassuring MRI, thought cervicogenic headache. Persistent episodic headache. Last visit we started gabapentin 185m BID. Today she describes pain predominantly on right side of head that starts occipital head and travels to temple area. Denies vision changes. Describes superficial burning pain at R occipital area as well as more diffuse deeper head pain.   ESR 21 (03/16/2017)  She feels her gait is more unsteady and she feels more imbalanced. She also notices some orthostatic lightheadedness when first standing that quickly resolves. She is staying well hydrated.   Denies dizziness, vertigo, presyncope.  Denies hypoglylcemia.   Prior lyrica caused severe depression.   Relevant past medical, surgical, family and social history reviewed and updated as indicated. Interim medical history since our last visit reviewed. Allergies and medications reviewed and updated. Outpatient Medications Prior to Visit  Medication Sig Dispense Refill  . aspirin EC 81 MG tablet Take 81 mg by mouth daily.    . BD ULTRA-FINE LANCETS lancets Use as instructed upto 6 times daily 600 each 2  . Cholecalciferol 50000 UNITS TABS Take 25,000 mg by mouth every 14 (fourteen) days. Take one tablet by mouth every 14 days (2 weeks)    . ciprofloxacin (CILOXAN) 0.3 % ophthalmic solution Place 1 drop into the left eye every 4 (four) hours while awake. 5 mL 0  . Cyanocobalamin (B-12 SL) Place 1 drop under the tongue daily.     .Marland Kitchenglucagon (GLUCAGON EMERGENCY) 1 MG injection Inject 1 mg into the muscle once as  needed. 1 each 12  . ibuprofen (ADVIL,MOTRIN) 600 MG tablet Take 1 tablet (600 mg total) by mouth every 8 (eight) hours as needed. 30 tablet 0  . insulin aspart (NOVOLOG) 100 UNIT/ML injection Use in an insulin pump upto 50 units per day 2 vial 6  . Lecithin 400 MG CAPS Take 1 capsule by mouth once a week.     . metFORMIN (GLUCOPHAGE-XR) 500 MG 24 hr tablet Take 1 tablet (500 mg total) by mouth 3 (three) times daily. 270 tablet 3  . pantoprazole (PROTONIX) 40 MG tablet Take 1 tablet (40 mg total) by mouth daily. 30 tablet 1  . rosuvastatin (CRESTOR) 5 MG tablet Take 1 tablet (5 mg total) by mouth once a week. 30 tablet 3  . SYNTHROID 75 MCG tablet Take by mouth 75 mcg daily 1 tab 6/7 days and 1.5 tabs 1/7 days. 90 tablet 3  . gabapentin (NEURONTIN) 100 MG capsule take 1 capsule by mouth twice a day 60 capsule 1  . lisinopril (PRINIVIL,ZESTRIL) 10 MG tablet take 1 tablet by mouth once daily (Patient taking differently: take 1/2 tablet.) 30 tablet 6   No facility-administered medications prior to visit.      Per HPI unless specifically indicated in ROS section below Review of Systems     Objective:    BP 128/62   Pulse 79   Temp (!) 97.5 F (36.4 C) (Oral)   Wt  153 lb 8 oz (69.6 kg)   SpO2 97%   BMI 26.76 kg/m   Wt Readings from Last 3 Encounters:  05/25/17 153 lb 8 oz (69.6 kg)  04/07/17 156 lb (70.8 kg)  03/16/17 156 lb (70.8 kg)    Physical Exam  Constitutional: She is oriented to person, place, and time. She appears well-developed and well-nourished. No distress.  Musculoskeletal: She exhibits no edema.  Neurological: She is alert and oriented to person, place, and time. She has normal strength. No cranial nerve deficit or sensory deficit. She displays a negative Romberg sign. Coordination and gait normal.  CN 2-12 intact Station and gait intact  Skin: Skin is warm and dry. No rash noted.  Psychiatric: She has a normal mood and affect.  Nursing note and vitals  reviewed.  Results for orders placed or performed in visit on 03/16/17  Comprehensive metabolic panel  Result Value Ref Range   Sodium 141 135 - 145 mEq/L   Potassium 3.8 3.5 - 5.1 mEq/L   Chloride 108 96 - 112 mEq/L   CO2 27 19 - 32 mEq/L   Glucose, Bld 131 (H) 70 - 99 mg/dL   BUN 14 6 - 23 mg/dL   Creatinine, Ser 0.92 0.40 - 1.20 mg/dL   Total Bilirubin 0.2 0.2 - 1.2 mg/dL   Alkaline Phosphatase 60 39 - 117 U/L   AST 13 0 - 37 U/L   ALT 13 0 - 35 U/L   Total Protein 6.4 6.0 - 8.3 g/dL   Albumin 3.9 3.5 - 5.2 g/dL   Calcium 9.6 8.4 - 10.5 mg/dL   GFR 63.30 >60.00 mL/min  CBC with Differential/Platelet  Result Value Ref Range   WBC 8.6 4.0 - 10.5 K/uL   RBC 5.27 (H) 3.87 - 5.11 Mil/uL   Hemoglobin 13.7 12.0 - 15.0 g/dL   HCT 42.3 36.0 - 46.0 %   MCV 80.2 78.0 - 100.0 fl   MCHC 32.5 30.0 - 36.0 g/dL   RDW 14.7 11.5 - 15.5 %   Platelets 237.0 150.0 - 400.0 K/uL   Neutrophils Relative % 61.4 43.0 - 77.0 %   Lymphocytes Relative 28.1 12.0 - 46.0 %   Monocytes Relative 5.9 3.0 - 12.0 %   Eosinophils Relative 3.4 0.0 - 5.0 %   Basophils Relative 1.2 0.0 - 3.0 %   Neutro Abs 5.3 1.4 - 7.7 K/uL   Lymphs Abs 2.4 0.7 - 4.0 K/uL   Monocytes Absolute 0.5 0.1 - 1.0 K/uL   Eosinophils Absolute 0.3 0.0 - 0.7 K/uL   Basophils Absolute 0.1 0.0 - 0.1 K/uL  Sedimentation rate  Result Value Ref Range   Sed Rate 21 0 - 30 mm/hr  Lyme Ab/Western Blot Reflex  Result Value Ref Range   B burgdorferi Ab IgG+IgM <0.90 <0.90 Index  Rocky mtn spotted fvr abs pnl(IgG+IgM)  Result Value Ref Range   RMSF IgG NOT DETECTED NOT DETECTED   RMSF IgM NOT DETECTED NOT DETECTED  Hemoglobin A1c  Result Value Ref Range   Hgb A1c MFr Bld 7.9 (H) 4.6 - 6.5 %   MRI HEAD WITHOUT CONTRAST TECHNIQUE: Multiplanar, multiecho pulse sequences of the brain and surrounding structures were obtained without intravenous contrast. COMPARISON: None. FINDINGS: BRAIN: No reduced diffusion to suggest acute ischemia  or hyperacute demyelination . No susceptibility artifact to suggest hemorrhage. The ventricles and sulci are normal for patient's age. A few scattered subcentimeter supratentorial and pontine white matter FLAIR T2 hyperintensities most commonly seen with  chronic small vessel ischemic disease are less than expected for age. No suspicious parenchymal signal, masses or mass effect. No abnormal extra-axial fluid collections.  VASCULAR: Normal major intracranial vascular flow voids present at skull base. SKULL AND UPPER CERVICAL SPINE: No abnormal sellar expansion. No suspicious calvarial bone marrow signal. Craniocervical junction maintained. SINUSES/ORBITS: Small LEFT maxillary mucosal retention cysts without paranasal sinus air-fluid levels. The included ocular globes and orbital contents are non-suspicious. Status post bilateral ocular lens implants. OTHER: None. IMPRESSION: Negative noncontrast MRI of the head for age. Resolved provided Dr. Marcello Moores Jone's answering service on March 13, 2017 at [place] hours by interpreting radiologist. Electronically Signed By: Elon Alas M.D. On: 03/13/2017 16:48    Assessment & Plan:  Over 25 minutes were spent face-to-face with the patient during this encounter and >50% of that time was spent on counseling and coordination of care  Problem List Items Addressed This Visit    Intractable episodic headache    ESR was normal 02/2017 pointing against GCA.  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  She did not tolerate gabapentin - and previously did not tolerate lyrica. I recommend trial of TCA - will Rx low dose nortriptyline 23m nightly and gage effect on headache. If no improvement, discussed neurology referral. Pt remains worried about aneurysm and tumor given fmhx - again reassured with normal MRI.       Relevant Medications   nortriptyline  (PAMELOR) 25 MG capsule   Lightheadedness - Primary    Characterized as mild unsteadiness when on her feet - no pre-syncope, vertigo, or significant lightheadedness. She also has separate orthostatic lightheadedness. Recommend stop gabapentin - as it is likely causing these symptoms. See below.           Follow up plan: Return if symptoms worsen or fail to improve.  JRia Bush MD

## 2017-05-25 NOTE — Assessment & Plan Note (Signed)
Characterized as mild unsteadiness when on her feet - no pre-syncope, vertigo, or significant lightheadedness. She also has separate orthostatic lightheadedness. Recommend stop gabapentin - as it is likely causing these symptoms. See below.

## 2017-05-30 ENCOUNTER — Telehealth: Payer: PPO | Admitting: Family

## 2017-05-30 DIAGNOSIS — T50905A Adverse effect of unspecified drugs, medicaments and biological substances, initial encounter: Secondary | ICD-10-CM

## 2017-05-30 NOTE — Progress Notes (Signed)
Based on what you shared with me it looks like you have a serious condition that should be evaluated in a face to face office visit.  NOTE: Even if you have entered your credit card information for this eVisit, you will not be charged.   If you are having a true medical emergency please call 911.  If you need an urgent face to face visit, [facility] has four urgent care centers for your convenience.  If you need care fast and have a high deductible or no insurance consider:   [email or web address]  [phone]  [address], [address] [place], [place] [address] 8 am to 8 pm Monday-Friday 10 am to 4 pm Saturday-Sunday   The following sites will take your  insurance:    . [facility]  [phone] Get Driving Directions Find a Provider at this Location  [address] [place], [place] [address] . 10 am to 8 pm Monday-Friday . 12 pm to 8 pm Saturday-Sunday   . [facility]  [phone] Get Driving Directions Find a Provider at this Location  [address], [address] [place], [place] [address] . 8 am to 8 pm Monday-Friday . 9 am to 6 pm Saturday . 11 am to 6 pm Sunday   . [facility] [place]  [phone] Get Driving Directions  [address] [place], [place] [address] . 8 am to 8 pm Monday-Friday . 8 am to 4 pm Saturday-Sunday   Your e-visit answers were reviewed by a board certified advanced clinical practitioner to complete your personal care plan.  Thank you for using e-Visits.  

## 2017-06-07 DIAGNOSIS — E109 Type 1 diabetes mellitus without complications: Secondary | ICD-10-CM | POA: Diagnosis not present

## 2017-06-29 ENCOUNTER — Ambulatory Visit (INDEPENDENT_AMBULATORY_CARE_PROVIDER_SITE_OTHER): Payer: PPO | Admitting: Internal Medicine

## 2017-06-29 ENCOUNTER — Encounter: Payer: Self-pay | Admitting: Internal Medicine

## 2017-06-29 VITALS — BP 122/72 | HR 78 | Ht 64.0 in | Wt 151.0 lb

## 2017-06-29 DIAGNOSIS — E039 Hypothyroidism, unspecified: Secondary | ICD-10-CM | POA: Diagnosis not present

## 2017-06-29 DIAGNOSIS — E109 Type 1 diabetes mellitus without complications: Secondary | ICD-10-CM

## 2017-06-29 DIAGNOSIS — E139 Other specified diabetes mellitus without complications: Secondary | ICD-10-CM

## 2017-06-29 LAB — POCT GLYCOSYLATED HEMOGLOBIN (HGB A1C): HEMOGLOBIN A1C: 7.3

## 2017-06-29 NOTE — Progress Notes (Signed)
Patient ID: Cassandra Benson, female   DOB: November 19, 1940, 76 y.o.   MRN: [id number]   HPI: Cassandra Benson is a 76 y.o.-year-old female, returning for f/u for LADA, initially dx'ed in 1998 (76 y/o), started insulin at dx, started insulin pump in ~2008, uncontrolled, without complications and hypothyroidism. She previously saw endocrinology at [facility] (Dr. Janese Banks) and Dr Howell Rucks. Last visit with me 3.5 mo ago.  She has a lot of pbs with getting strips and supplies for her pums >> checks sugars seldom.  She tried Nortriptyline for HAs >> had an adverse rxn. (facial swelling)  She had shingles since last visit. Had this 3x.  Last hemoglobin A1c was: Lab Results  Component Value Date   HGBA1C 7.9 (H) 03/16/2017   HGBA1C 6.10f04/07/2017   HGBA1C 7.7 (H) 08/09/2016   Pt is on an insulin pump: Medtronic 723 - started in 09/2016, without CGM, uses Novolog in the pump. Was using [employer] for supplies but now forced to use [employer].  She is also on Metformin ER 500 mg 3x a day. Cannot take Generic Metformin ER b/c diarrhea.  Pump settings: - basal rates: 12 am: 0.800 units/h 3 am: 0.875 9:30 am: 0.100 5 pm: 1.500 6:30 pm: 1.300 11:30 pm: 1.800  (STILL did not change...) - ICR:   12 am: 10 6 pm: 8 - target: 130-130 except 6-8 am and 6 pm-12 am: 100-100  - ISF: 35 - Insulin on Board: 4h - bolus wizard: on TDD from basal insulin: 81% (18.2 units) >> 74% >> 79% >> 82% TDD from bolus insulin: 19% (4.3 units) >> 26% >> 21% >> 18% - extended bolusing: not using - changes infusion site: q7 >> q 3-4 days >> now again every ~1 week - Meter: Bayer Contour - not covered  Pt checks her sugars 1.4x a day and they are 129 +/- 34 >> 130 +/- 18 >> 128 +/- 27 - am: 86-109 >> 98-130 >> 111-150, 162 >> 95-127 - 2h after b'fast: 88-119 >> n/c >> 107-148 - before lunch: 95-116 >> 92-132 >> 130-131 - 2h after lunch: 107 >> 76 >> n/c - before dinner: 97, 104, 112 >> 99-180, 202 (no bolusing with lunch) >> 86 >>  n/c - 2h after dinner: n/c >> 133, 222 (no bolusing with dinner) >> n/c >> 219 - bedtime: n/c - nighttime:  43, 55 - midnight,204 x1 >> 92-141, 183 >> n/c Lowest sugar was 43 >> 82 at night >> 86; she has hypoglycemia awareness at 80. No h/o Hypoglycemia admission. She does have a a glucagon kit at home. Highest sugar was 222 >> 162. No h/o DKA admissions.  Pt's meals are: - Breakfast: protein drink + almond milk - Lunch: PB jelly sandwich or sandwich with ham or cream cheese sandwich - Dinner: salads  - Snacks: pretzels; pork tenderloin + veggies; chicken + tenderloin  - No CKD, last BUN/creatinine:  Lab Results  Component Value Date   BUN 14 03/16/2017   BUN 18 12/25/2016   CREATININE 0.92 03/16/2017   CREATININE 0.86 12/25/2016  On Lisinopril. - last set of lipids: Lab Results  Component Value Date   CHOL 245 (H) 08/09/2016   HDL 41.80 08/09/2016   LDLCALC 160 01/11/2016   LDLDIRECT 177.0 08/09/2016   TRIG 258.0 (H) 08/09/2016   CHOLHDL 6 08/09/2016  On Crestor 5>> mm cramps. On CoQ10. Praluent was not covered. - last eye exam was in 10/2016: No DR - no numbness and tingling in her  feet. Pain in her L big toe - occasionally. Foot exam normal by PCP: 08/15/2016.  She also has hypothyroidism: - Due to Hashimoto's thyroiditis - She has Graves' disease family history:  mother - was on Levoxyl 75 mcg daily 1 tab 6/7 days and 1.5 tabs 1/7 days. She tought Levoxyl increased her lipid levels. we changed to Synthroid and she started to feel much better without so much hair loss. She is currently on the same dose of Synthroid d.a.w.   Last TSH normal: Lab Results  Component Value Date   TSH 0.58 01/04/2017   She also has SLE.  We checked her for hirsutism and acne - labs normal: Component     Latest Ref Rng & Units 09/02/2014 09/08/2014  Testosterone     3 - 41 ng/dL 59 (H)   Testosterone Free     0.0 - 4.2 pg/mL 2.1   FSH     mIU/mL  68.6  LH     mIU/mL  43.4   DHEA-SO4     7 - 177 ug/dL 49   Cortisol, Plasma     ug/dL  1.3 (Normal Dexamethasone suppression test)   Estradiol     pg/mL  24.0   Also: Component     Latest Ref Rng & Units 01/04/2017  Cortisol - AM     mcg/dL 17.6  C206 ACTH     6 - 50 pg/mL 15   Labs showed no signs of adrenal insufficiency.  ROS: Constitutional: no weight gain/no weight loss, no fatigue, no subjective hyperthermia, no subjective hypothermia Eyes: no blurry vision, no xerophthalmia ENT: no sore throat, no nodules palpated in throat, no dysphagia, no odynophagia, no hoarseness Cardiovascular: no CP/no SOB/no palpitations/no leg swelling Respiratory: no cough/no SOB/no wheezing Gastrointestinal: no N/no V/no D/no C/no acid reflux Musculoskeletal: + muscle aches/+ joint aches Skin: no rashes, no hair loss, + excess hair face Neurological: no tremors/no numbness/no tingling/no dizziness, + HA  I reviewed pt's medications, allergies, PMH, social hx, family hx, and changes were documented in the history of present illness. Otherwise, unchanged from my initial visit note.  Past Medical History:  Diagnosis Date  . Allergy   . Arthritis   . Carotid stenosis, asymptomatic 06/19/2015   7-32% RICA 20-25% LICA rpt 1 yr (12/2704)   . Diabetes mellitus without complication ([facility])   . Fibromyalgia    prior PCP  . GERD (gastroesophageal reflux disease)    prior PCP  . Glaucoma    Narrow angle  . History of blood clots    DVT, in 20s, none since  . History of chicken pox   . History of diverticulitis   . History of pericarditis 1986   with hospitalization  . History of pneumonia 2014  . History of shingles   . History of UTI   . Hyperlipidemia   . Hypothyroidism   . Lupus (systemic lupus erythematosus) ([facility])   . Shoulder pain left   h/o RTC tendonitis and adhesive capsulitis  . Sleep apnea    prior PCP  . Vitamin D deficiency    prior PCP   Past Surgical History:  Procedure Laterality Date  .  ABDOMINAL HYSTERECTOMY  1978   fibroids and menorrhagia, ovaries remain  . [facility] normal per patient  . COLONOSCOPY WITH ESOPHAGOGASTRODUODENOSCOPY (EGD)  03/2007   2 ulcers, benign polyp, rpt 5 yrs ([facility], [place])  . PARTIAL HIP ARTHROPLASTY  2013   Right hip  replacement  . TONSILLECTOMY AND ADENOIDECTOMY    . VAGINAL DELIVERY     x2, no complications   Social History   Social History  . Widowed   . Number of children: 2   Occupational History  Retired    Social History Main Topics  . Smoking status: Never Smoker  . Smokeless tobacco: Never Used  . Alcohol use No  . Drug use: No   Social History Narrative   Lives in [place] now. Recently moved from [place].   No pets.   Mother of Eleftheria Taborn.   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 aspirin EC 81 MG tablet Take 81 mg by mouth daily.    . BD ULTRA-FINE LANCETS lancets Use as instructed upto 6 times daily 600 each 2  . Cholecalciferol 50000 UNITS TABS Take 25,000 mg by mouth every 14 (fourteen) days. Take one tablet by mouth every 14 days (2 weeks)    . ciprofloxacin (CILOXAN) 0.3 % ophthalmic solution Place 1 drop into the left eye every 4 (four) hours while awake. 5 mL 0  . Cyanocobalamin (B-12 SL) Place 1 drop under the tongue daily.     Marland Kitchen ibuprofen (ADVIL,MOTRIN) 600 MG tablet Take 1 tablet (600 mg total) by mouth every 8 (eight) hours as needed. 30 tablet 0  . insulin aspart (NOVOLOG) 100 UNIT/ML injection Use in an insulin pump upto 50 units per day 2 vial 6  . Lecithin 400 MG CAPS Take 1 capsule by mouth once a week.     Marland Kitchen lisinopril (PRINIVIL,ZESTRIL) 10 MG tablet take  1/2 tablet daily. 30 tablet 6  . metFORMIN (GLUCOPHAGE-XR) 500 MG 24 hr tablet Take 1 tablet (500 mg total) by mouth 3 (three) times daily. 270 tablet 3  . Multiple Vitamins-Minerals (PRESERVISION AREDS) TABS Take 2 tablets by mouth daily.    . pantoprazole (PROTONIX) 40 MG tablet Take 1 tablet (40 mg total) by mouth daily. 30 tablet 1  . rosuvastatin (CRESTOR) 5 MG tablet Take 1 tablet (5 mg total) by mouth once a week. 30 tablet 3  . SYNTHROID 75 MCG tablet Take by mouth 75 mcg daily 1 tab 6/7 days and 1.5 tabs 1/7 days. 90 tablet 3  . glucagon (GLUCAGON EMERGENCY) 1 MG injection Inject 1 mg into the muscle once as needed. (Patient not taking: Reported on 06/29/2017) 1 each 12  . nortriptyline (PAMELOR) 10 MG capsule Take 1-2 capsules (10-20 mg total) by mouth at bedtime. Start at 1 cap nightly with option to increase if needed for headache (Patient not taking: Reported on 06/29/2017) 40 capsule 3   No current facility-administered medications on file prior to visit.    Allergies  Allergen Reactions  . Iodinated Diagnostic Agents Other (See Comments)    Itching (severe) and chest tightness  . Codeine Nausea Only  . Influenza Vaccines     Muscle weakness; unable to walk  . Pamelor [Nortriptyline Hcl]     Patient states caused her face to draw in together.  . Valsartan     Allergy to generic only Other reaction(s): Cough "Hacking" cough  . Erythromycin Rash and Swelling  . Penicillins Swelling and Rash  Severe Swelling  . Sulfa Antibiotics Rash   Family History  Problem Relation Age of Onset  . CAD Mother 4       MI, aortic valve issues  . COPD Mother   . Lupus Mother   . CAD Father 45       CABG x2, aortic valve replacement  . Stroke Sister   . Alcohol abuse Brother   . CAD Brother 10       MI  . Stroke Brother   . Seizures Son   . COPD Brother   . CAD Brother 43       stent  . Diabetes Brother   . Depression Grandchild   . Cancer Maternal Aunt        breast  .  Breast cancer Maternal Aunt   . Diabetes Sister        deceased  . CAD Sister 51       stents  . Breast cancer Maternal Aunt    PE: BP 122/72 (BP Location: Left Arm, Patient Position: Sitting)   Pulse 78   Ht '5\' 4"'  (1.626 m)   Wt 151 lb (68.5 kg)   SpO2 97%   BMI 25.92 kg/m  Wt Readings from Last 3 Encounters:  06/29/17 151 lb (68.5 kg)  05/25/17 153 lb 8 oz (69.6 kg)  04/07/17 156 lb (70.8 kg)   Constitutional: normal weight, in NAD Eyes: PERRLA, EOMI, no exophthalmos ENT: moist mucous membranes, no thyromegaly, no cervical lymphadenopathy Cardiovascular: RRR, No MRG Respiratory: CTA B Gastrointestinal: abdomen soft, NT, ND, BS+ Musculoskeletal: no deformities, strength intact in all 4 Skin: moist, warm, no rashes Neurological: no tremor with outstretched hands, DTR normal in all 4  ASSESSMENT: 1. DM1, uncontrolled, without complications  2. Hypothyroidism  PLAN:  1. Patient with long-standing, uncontrolled LADA, on insulin pump and metformin. She did not make the setting change I suggested at last visit. She is not checking sugars enough 2/2 lack of strip coverage.  - She does manual boluses, but we discussed that these are not as efficient as Bolus Wizard ones. She plans to get back in touch with her insurance so she can get more strips.  - She is also not changing the infusion set as frequently as recommended as she is having pbs with the supplier Co. Will d./w her insurance to allow her to switch back to Med[employer] for pump supplies. - she continues Metformin 3x a day - sugars are slightly higher than goal, w/o patterns - We did discuss that until she uses the pump correctly, we cannot change her settings. - I advised her to:  Patient Instructions  Please continue the following pump settings: - basal rates: 12 am: 0.800 units/h 3 am: 0.875 9:30 am: 0.100 5 pm: 1.500 6:30 pm: 1.300 11:30 pm: 1.800  - ICR:   12 am: 10 6 pm: 8 - target: 130-130 except 6-8 am  and 6 pm-12 am: 100-100  - ISF: 35 - Insulin on Board: 4h  Please continue Metformin XR 500 mg 3x a day.  Please return in 4 months.  - today, HbA1c is 7.3% (better) - continue checking sugars at different times of the day - check 4x a day, rotating checks - advised for yearly eye exams >> she is UTD - refuses flu shot - Return to clinic in 4 mo with sugar log    2. Hypothyroidism - Earlier this year, we changed from Levoxyl to Synthroid d.a.w. 75 mcg daily 1 tab  6/7 days and 1.5 tabs 1/7 days.  She is feeling better on this and feels that her hair loss has improved. - latest thyroid labs reviewed with pt >> normal  - we discussed about taking the thyroid hormone every day, with water, >30 minutes before breakfast, separated by >4 hours from acid reflux medications, calcium, iron, multivitamins. Pt. is taking it correctly - will check thyroid tests at next visit  Philemon Kingdom, MD PhD [facility] End[facility]

## 2017-06-29 NOTE — Patient Instructions (Addendum)
Please continue the following pump settings: - basal rates: 12 am: 0.800 units/h 3 am: 0.875 9:30 am: 0.100 5 pm: 1.500 6:30 pm: 1.300 11:30 pm: 1.800  - ICR:   12 am: 10 6 pm: 8 - target: 130-130 except 6-8 am and 6 pm-12 am: 100-100  - ISF: 35 - Insulin on Board: 4h  Please continue Metformin XR 500 mg 3x a day.  Please return in 4 months.

## 2017-07-03 ENCOUNTER — Other Ambulatory Visit: Payer: Self-pay

## 2017-07-03 MED ORDER — GLUCOSE BLOOD VI STRP: ORAL_STRIP | 5 refills | Status: DC

## 2017-07-06 ENCOUNTER — Telehealth: Payer: Self-pay | Admitting: Internal Medicine

## 2017-07-06 NOTE — Telephone Encounter (Signed)
Called back [employer], they are refaxing them to me to the back fax machine.

## 2017-07-06 NOTE — Telephone Encounter (Signed)
Ronny Bacon with [employer] [phone] called regarding pt calling to verify certificate of necessity for supplies and how often testing. Thank you!

## 2017-07-11 NOTE — Telephone Encounter (Signed)
Ronny Bacon called back checking to see if we received these papers. Please advise, thank you!  Call [phone]

## 2017-07-11 NOTE — Telephone Encounter (Signed)
Did receive paperwork, faxed over today.

## 2017-07-20 DIAGNOSIS — E109 Type 1 diabetes mellitus without complications: Secondary | ICD-10-CM | POA: Diagnosis not present

## 2017-08-10 ENCOUNTER — Other Ambulatory Visit: Payer: Self-pay | Admitting: Family Medicine

## 2017-08-10 DIAGNOSIS — E559 Vitamin D deficiency, unspecified: Secondary | ICD-10-CM

## 2017-08-10 DIAGNOSIS — E039 Hypothyroidism, unspecified: Secondary | ICD-10-CM

## 2017-08-10 DIAGNOSIS — E7800 Pure hypercholesterolemia, unspecified: Secondary | ICD-10-CM

## 2017-08-10 DIAGNOSIS — E139 Other specified diabetes mellitus without complications: Secondary | ICD-10-CM

## 2017-08-11 ENCOUNTER — Ambulatory Visit: Payer: PPO

## 2017-08-11 ENCOUNTER — Other Ambulatory Visit (INDEPENDENT_AMBULATORY_CARE_PROVIDER_SITE_OTHER): Payer: PPO

## 2017-08-11 DIAGNOSIS — E7800 Pure hypercholesterolemia, unspecified: Secondary | ICD-10-CM

## 2017-08-11 DIAGNOSIS — E559 Vitamin D deficiency, unspecified: Secondary | ICD-10-CM | POA: Diagnosis not present

## 2017-08-11 DIAGNOSIS — E039 Hypothyroidism, unspecified: Secondary | ICD-10-CM

## 2017-08-11 LAB — LIPID PANEL
CHOL/HDL RATIO: 6
Cholesterol: 251 mg/dL — ABNORMAL HIGH (ref 0–200)
HDL: 40 mg/dL (ref >39.00)
LDL CALC: 179 mg/dL — AB (ref 0–99)
NONHDL: 210.78
Triglycerides: 159 mg/dL — ABNORMAL HIGH (ref 0.0–149.0)
VLDL: 31.8 mg/dL (ref 0.0–40.0)

## 2017-08-11 LAB — COMPREHENSIVE METABOLIC PANEL
ALBUMIN: 4.1 g/dL (ref 3.5–5.2)
ALT: 14 U/L (ref 0–35)
AST: 17 U/L (ref 0–37)
Alkaline Phosphatase: 75 U/L (ref 39–117)
BUN: 16 mg/dL (ref 6–23)
CHLORIDE: 105 meq/L (ref 96–112)
CO2: 28 meq/L (ref 19–32)
CREATININE: 0.83 mg/dL (ref 0.40–1.20)
Calcium: 10 mg/dL (ref 8.4–10.5)
GFR: 71.21 mL/min (ref >60.00)
GLUCOSE: 107 mg/dL — AB (ref 70–99)
POTASSIUM: 4.7 meq/L (ref 3.5–5.1)
SODIUM: 141 meq/L (ref 135–145)
Total Bilirubin: 0.3 mg/dL (ref 0.2–1.2)
Total Protein: 7 g/dL (ref 6.0–8.3)

## 2017-08-11 LAB — VITAMIN D 25 HYDROXY (VIT D DEFICIENCY, FRACTURES): VITD: 53.71 ng/mL (ref 30.00–100.00)

## 2017-08-11 LAB — TSH: TSH: 0.24 u[IU]/mL — AB (ref 0.35–4.50)

## 2017-08-21 ENCOUNTER — Telehealth: Payer: Self-pay | Admitting: Family Medicine

## 2017-08-21 ENCOUNTER — Encounter: Payer: Self-pay | Admitting: Family Medicine

## 2017-08-21 ENCOUNTER — Ambulatory Visit (INDEPENDENT_AMBULATORY_CARE_PROVIDER_SITE_OTHER): Payer: PPO | Admitting: Family Medicine

## 2017-08-21 VITALS — BP 122/70 | HR 84 | Temp 97.9°F | Ht 63.75 in | Wt 150.5 lb

## 2017-08-21 DIAGNOSIS — Z0000 Encounter for general adult medical examination without abnormal findings: Secondary | ICD-10-CM | POA: Diagnosis not present

## 2017-08-21 DIAGNOSIS — I6523 Occlusion and stenosis of bilateral carotid arteries: Secondary | ICD-10-CM

## 2017-08-21 DIAGNOSIS — M329 Systemic lupus erythematosus, unspecified: Secondary | ICD-10-CM

## 2017-08-21 DIAGNOSIS — E7800 Pure hypercholesterolemia, unspecified: Secondary | ICD-10-CM

## 2017-08-21 DIAGNOSIS — F4323 Adjustment disorder with mixed anxiety and depressed mood: Secondary | ICD-10-CM

## 2017-08-21 DIAGNOSIS — R519 Headache, unspecified: Secondary | ICD-10-CM

## 2017-08-21 DIAGNOSIS — I10 Essential (primary) hypertension: Secondary | ICD-10-CM

## 2017-08-21 DIAGNOSIS — R51 Headache: Secondary | ICD-10-CM

## 2017-08-21 DIAGNOSIS — E139 Other specified diabetes mellitus without complications: Secondary | ICD-10-CM

## 2017-08-21 DIAGNOSIS — H3530 Unspecified macular degeneration: Secondary | ICD-10-CM | POA: Insufficient documentation

## 2017-08-21 DIAGNOSIS — E039 Hypothyroidism, unspecified: Secondary | ICD-10-CM

## 2017-08-21 MED ORDER — ROSUVASTATIN CALCIUM 5 MG PO TABS: 5.0000 mg | ORAL_TABLET | ORAL | 6 refills | Status: DC

## 2017-08-21 NOTE — Assessment & Plan Note (Signed)
Preventative protocols reviewed and updated unless pt declined. Discussed healthy diet and lifestyle.  

## 2017-08-21 NOTE — Progress Notes (Signed)
BP 122/70 (BP Location: Left Arm, Patient Position: Sitting, Cuff Size: Normal)   Pulse 84   Temp 97.9 F (36.6 C) (Oral)   Ht 5' 3.75" (1.619 m)   Wt 150 lb 8 oz (68.3 kg)   SpO2 98%   BMI 26.04 kg/m    CC: medicare wellness visit Subjective:    Patient ID: Cassandra Benson, female    DOB: December 04, 1940, 76 y.o.   MRN: [id number]  HPI: Cassandra Benson is a 76 y.o. female presenting on 08/21/2017 for Medicare Wellness   Walking 1 hour every day with friends. Some R hip pain with walking >40 mins.  Has not tolerated gabapentin, lyrica or nortriptyline well.   Preventative: Colon cancer screening - normal 09/2012 per prior records, good for 10 yrs [facility]) Breast cancer screening - mammo/US WNL 12/2016. Does self breast exams at home.  Well woman exam - last 05-Jan-2012 s/p abd hysterectomy, ovaries remain.  DEXA scan - normal per patient 04/2012.  Flu shot - ALLERGIC Tdap 07/2012.  Pneumovax January 04, 2013, prevnar 04-Jan-2014.  Shingles shot - declines. Has had shingles.  Advanced directive discussion - has at home. HCPOA is son Catalina [place] and [place]). Will bring me copy.  Seat belt use discussed.  Sunscreen use discussed, no changing moles on skin Non smoker Alcohol - none  Lives in [place] now. Recently moved from [place]. Husband deceased mid Jan 05, 2016. No pets. 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 Diet - good water, fruits/vegetables daily, limited meat, protein drink every morning  Relevant past medical, surgical, family and social history reviewed and updated as indicated. Interim medical history since our last visit reviewed. Allergies and medications reviewed and updated. Outpatient Medications Prior to Visit  Medication Sig Dispense Refill  . aspirin EC 81 MG tablet Take 81 mg by mouth daily.    . BD ULTRA-FINE LANCETS lancets  Use as instructed upto 6 times daily 600 each 2  . Cholecalciferol 50000 UNITS TABS Take 25,000 mg by mouth every 14 (fourteen) days. Take one tablet by mouth every 14 days (2 weeks)    . ciprofloxacin (CILOXAN) 0.3 % ophthalmic solution Place 1 drop into the left eye every 4 (four) hours while awake. 5 mL 0  . Cyanocobalamin (B-12 SL) Place 1 drop under the tongue daily.     Marland Kitchen glucose blood (CONTOUR NEXT TEST) test strip Use as instructed to check 4 times daily 400 each 5  . ibuprofen (ADVIL,MOTRIN) 600 MG tablet Take 1 tablet (600 mg total) by mouth every 8 (eight) hours as needed. 30 tablet 0  . insulin aspart (NOVOLOG) 100 UNIT/ML injection Use in an insulin pump upto 50 units per day 2 vial 6  . Lecithin 400 MG CAPS Take 1 capsule by mouth once a week.     Marland Kitchen lisinopril (PRINIVIL,ZESTRIL) 10 MG tablet take 1/2 tablet daily. 30 tablet 6  . metFORMIN (GLUCOPHAGE-XR) 500 MG 24 hr tablet Take 1 tablet (500 mg total) by mouth 3 (three) times daily. (Patient taking differently: Take 500 mg by mouth 2 (two) times daily. ) 270 tablet 3  . Multiple Vitamins-Minerals (PRESERVISION AREDS) TABS Take 2 tablets by mouth daily.    . pantoprazole (PROTONIX) 40 MG tablet Take 1 tablet (40 mg total) by mouth daily. 30 tablet 1  . SYNTHROID 75 MCG tablet Take by  mouth 75 mcg daily 1 tab 6/7 days and 1.5 tabs 1/7 days. 90 tablet 3  . rosuvastatin (CRESTOR) 5 MG tablet Take 1 tablet (5 mg total) by mouth once a week. 30 tablet 3  . glucagon (GLUCAGON EMERGENCY) 1 MG injection Inject 1 mg into the muscle once as needed. (Patient not taking: Reported on 06/29/2017) 1 each 12  . nortriptyline (PAMELOR) 10 MG capsule Take 1-2 capsules (10-20 mg total) by mouth at bedtime. Start at 1 cap nightly with option to increase if needed for headache (Patient not taking: Reported on 06/29/2017) 40 capsule 3   No facility-administered medications prior to visit.      Per HPI unless specifically indicated in ROS section  below Review of Systems  Constitutional: Negative for activity change, appetite change, chills, fatigue, fever and unexpected weight change.  HENT: Negative for hearing loss.   Eyes: Negative for visual disturbance.  Respiratory: Negative for cough, chest tightness, shortness of breath and wheezing.   Cardiovascular: Negative for chest pain, palpitations and leg swelling.  Gastrointestinal: Negative for abdominal distention, abdominal pain, blood in stool, constipation, diarrhea, nausea and vomiting.  Genitourinary: Negative for difficulty urinating and hematuria.  Musculoskeletal: Negative for arthralgias, myalgias and neck pain.  Skin: Negative for rash.  Neurological: Positive for dizziness and headaches. Negative for seizures and syncope.  Hematological: Negative for adenopathy. Does not bruise/bleed easily.  Psychiatric/Behavioral: Negative for dysphoric mood. The patient is not nervous/anxious.        Objective:    BP 122/70 (BP Location: Left Arm, Patient Position: Sitting, Cuff Size: Normal)   Pulse 84   Temp 97.9 F (36.6 C) (Oral)   Ht 5' 3.75" (1.619 m)   Wt 150 lb 8 oz (68.3 kg)   SpO2 98%   BMI 26.04 kg/m   Wt Readings from Last 3 Encounters:  08/21/17 150 lb 8 oz (68.3 kg)  06/29/17 151 lb (68.5 kg)  05/25/17 153 lb 8 oz (69.6 kg)    Physical Exam  Constitutional: She is oriented to person, place, and time. She appears well-developed and well-nourished. No distress.  HENT:  Head: Normocephalic and atraumatic.  Right Ear: Hearing, tympanic membrane, external ear and ear canal normal.  Left Ear: Hearing, tympanic membrane, external ear and ear canal normal.  Nose: Nose normal.  Mouth/Throat: Uvula is midline, oropharynx is clear and moist and mucous membranes are normal. No oropharyngeal exudate, posterior oropharyngeal edema or posterior oropharyngeal erythema.  Eyes: Conjunctivae and EOM are normal. Pupils are equal, round, and reactive to light. No scleral  icterus.  Neck: Normal range of motion. Neck supple. Carotid bruit is not present. No thyromegaly present.  Cardiovascular: Normal rate, regular rhythm, normal heart sounds and intact distal pulses.  No murmur heard. Pulses:      Radial pulses are 2+ on the right side, and 2+ on the left side.  Pulmonary/Chest: Effort normal and breath sounds normal. No respiratory distress. She has no wheezes. She has no rales.  Abdominal: Soft. Bowel sounds are normal. She exhibits no distension and no mass. There is no tenderness. There is no rebound and no guarding.  Musculoskeletal: Normal range of motion. She exhibits no edema.  Lymphadenopathy:    She has no cervical adenopathy.  Neurological: She is alert and oriented to person, place, and time.  CN grossly intact, station and gait intact Recall 3/3 Calculation 3/5 serial 7s  Skin: Skin is warm and dry. No rash noted.  Psychiatric: She has a normal mood  and affect. Her behavior is normal. Judgment and thought content normal.  Nursing note and vitals reviewed.  Results for orders placed or performed in visit on 08/11/17  TSH  Result Value Ref Range   TSH 0.24 (L) 0.35 - 4.50 uIU/mL  Comprehensive metabolic panel  Result Value Ref Range   Sodium 141 135 - 145 mEq/L   Potassium 4.7 3.5 - 5.1 mEq/L   Chloride 105 96 - 112 mEq/L   CO2 28 19 - 32 mEq/L   Glucose, Bld 107 (H) 70 - 99 mg/dL   BUN 16 6 - 23 mg/dL   Creatinine, Ser 0.83 0.40 - 1.20 mg/dL   Total Bilirubin 0.3 0.2 - 1.2 mg/dL   Alkaline Phosphatase 75 39 - 117 U/L   AST 17 0 - 37 U/L   ALT 14 0 - 35 U/L   Total Protein 7.0 6.0 - 8.3 g/dL   Albumin 4.1 3.5 - 5.2 g/dL   Calcium 10.0 8.4 - 10.5 mg/dL   GFR 71.21 >60.00 mL/min  Lipid panel  Result Value Ref Range   Cholesterol 251 (H) 0 - 200 mg/dL   Triglycerides 159.0 (H) 0.0 - 149.0 mg/dL   HDL 40.00 >39.00 mg/dL   VLDL 31.8 0.0 - 40.0 mg/dL   LDL Cholesterol 179 (H) 0 - 99 mg/dL   Total CHOL/HDL Ratio 6    NonHDL 210.78    VITAMIN D 25 Hydroxy (Vit-D Deficiency, Fractures)  Result Value Ref Range   VITD 53.71 30.00 - 100.00 ng/mL      Assessment & Plan:   Problem List Items Addressed This Visit    Adjustment disorder with mixed anxiety and depressed mood    Advanced directive discussion - has at home. HCPOA is son Catalina [place] and [place]). Will bring me copy.       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       Relevant Medications   rosuvastatin (CRESTOR) 5 MG tablet   Other Relevant Orders   VAS US CAROTID   Essential hypertension    Chronic, stable. Continue current regimen.       Relevant Medications   rosuvastatin (CRESTOR) 5 MG tablet   Health maintenance examination    Preventative protocols reviewed and updated unless pt declined. Discussed healthy diet and lifestyle.       Hyperlipidemia    Chronic, LDL above goal. Pt agrees to re-trial crestor QOD dosing. H/o statin intolerance.       Relevant Medications   rosuvastatin (CRESTOR) 5 MG tablet   Hypothyroidism    Chronic. Brand synthroid has been titrated by endo.       Intractable episodic headache    Ongoing. Did not tolerate gabapentin, lyrica or TCA in the past.       LADA (latent autoimmune diabetes in adults), managed as type 1 ([place])    Appreciate endo care ([place])      Relevant Medications   rosuvastatin (CRESTOR) 5 MG tablet   Medicare annual wellness visit, subsequent - Primary    I have personally reviewed the Medicare Annual Wellness questionnaire and have noted 1. The patient's medical and social history 2. Their use of alcohol, tobacco or illicit drugs 3. Their current medications and supplements 4. The patient's functional ability including ADL's, fall risks, home safety risks and hearing or visual impairment. Cognitive function has been assessed and addressed as indicated.  5. Diet and physical activity 6. Evidence for depression or mood  disorders The  patients weight, height, BMI have been recorded in the chart. I have made referrals, counseling and provided education to the patient based on review of the above and I have provided the pt with a written personalized care plan for preventive services. Provider list updated.. See scanned questionairre as needed for further documentation. Reviewed preventative protocols and updated unless pt declined.       SLE (systemic lupus erythematosus) (HCC)    Chronic, stable period.           Follow up plan: Return in about 6 months (around 02/18/2018) for follow up visit.  [name], MD

## 2017-08-21 NOTE — Assessment & Plan Note (Signed)
Chronic, stable. Continue current regimen. 

## 2017-08-21 NOTE — Assessment & Plan Note (Signed)
Update US.  

## 2017-08-21 NOTE — Assessment & Plan Note (Signed)
Appreciate endo care Cassandra Benson)

## 2017-08-21 NOTE — Assessment & Plan Note (Signed)

## 2017-08-21 NOTE — Assessment & Plan Note (Signed)
Chronic. Brand synthroid has been titrated by endo.

## 2017-08-21 NOTE — Patient Instructions (Addendum)
Try crestor every other day dosing.  You are doing well today.  Return as needed or in 6 months for follow up visit.   Health Maintenance, Female Adopting a healthy lifestyle and getting preventive care can go a long way to promote health and wellness. Talk with your health care provider about what schedule of regular examinations is right for you. This is a good chance for you to check in with your provider about disease prevention and staying healthy. In between checkups, there are plenty of things you can do on your own. Experts have done a lot of research about which lifestyle changes and preventive measures are most likely to keep you healthy. Ask your health care provider for more information. Weight and diet Eat a healthy diet  Be sure to include plenty of vegetables, fruits, low-fat dairy products, and lean protein.  Do not eat a lot of foods high in solid fats, added sugars, or salt.  Get regular exercise. This is one of the most important things you can do for your health. ? Most adults should exercise for at least 150 minutes each week. The exercise should increase your heart rate and make you sweat (moderate-intensity exercise). ? Most adults should also do strengthening exercises at least twice a week. This is in addition to the moderate-intensity exercise.  Maintain a healthy weight  Body mass index (BMI) is a measurement that can be used to identify possible weight problems. It estimates body fat based on height and weight. Your health care provider can help determine your BMI and help you achieve or maintain a healthy weight.  For females 81 years of age and older: ? A BMI below 18.5 is considered underweight. ? A BMI of 18.5 to 24.9 is normal. ? A BMI of 25 to 29.9 is considered overweight. ? A BMI of 30 and above is considered obese.  Watch levels of cholesterol and blood lipids  You should start having your blood tested for lipids and cholesterol at 75 years of age,  then have this test every 5 years.  You may need to have your cholesterol levels checked more often if: ? Your lipid or cholesterol levels are high. ? You are older than 75 years of age. ? You are at high risk for heart disease.  Cancer screening Lung Cancer  Lung cancer screening is recommended for adults 77-23 years old who are at high risk for lung cancer because of a history of smoking.  A yearly low-dose CT scan of the lungs is recommended for people who: ? Currently smoke. ? Have quit within the past 15 years. ? Have at least a 30-pack-year history of smoking. A pack year is smoking an average of one pack of cigarettes a day for 1 year.  Yearly screening should continue until it has been 15 years since you quit.  Yearly screening should stop if you develop a health problem that would prevent you from having lung cancer treatment.  Breast Cancer  Practice breast self-awareness. This means understanding how your breasts normally appear and feel.  It also means doing regular breast self-exams. Let your health care provider know about any changes, no matter how small.  If you are in your 20s or 30s, you should have a clinical breast exam (CBE) by a health care provider every 1-3 years as part of a regular health exam.  If you are 27 or older, have a CBE every year. Also consider having a breast X-ray (mammogram) every year.  If you have a family history of breast cancer, talk to your health care provider about genetic screening.  If you are at high risk for breast cancer, talk to your health care provider about having an MRI and a mammogram every year.  Breast cancer gene (BRCA) assessment is recommended for women who have family members with BRCA-related cancers. BRCA-related cancers include: ? Breast. ? Ovarian. ? Tubal. ? Peritoneal cancers.  Results of the assessment will determine the need for genetic counseling and BRCA1 and BRCA2 testing.  Cervical Cancer Your  health care provider may recommend that you be screened regularly for cancer of the pelvic organs (ovaries, uterus, and vagina). This screening involves a pelvic examination, including checking for microscopic changes to the surface of your cervix (Pap test). You may be encouraged to have this screening done every 3 years, beginning at age 53.  For women ages 32-65, health care providers may recommend pelvic exams and Pap testing every 3 years, or they may recommend the Pap and pelvic exam, combined with testing for human papilloma virus (HPV), every 5 years. Some types of HPV increase your risk of cervical cancer. Testing for HPV may also be done on women of any age with unclear Pap test results.  Other health care providers may not recommend any screening for nonpregnant women who are considered low risk for pelvic cancer and who do not have symptoms. Ask your health care provider if a screening pelvic exam is right for you.  If you have had past treatment for cervical cancer or a condition that could lead to cancer, you need Pap tests and screening for cancer for at least 20 years after your treatment. If Pap tests have been discontinued, your risk factors (such as having a new sexual partner) need to be reassessed to determine if screening should resume. Some women have medical problems that increase the chance of getting cervical cancer. In these cases, your health care provider may recommend more frequent screening and Pap tests.  Colorectal Cancer  This type of cancer can be detected and often prevented.  Routine colorectal cancer screening usually begins at 75 years of age and continues through 75 years of age.  Your health care provider may recommend screening at an earlier age if you have risk factors for colon cancer.  Your health care provider may also recommend using home test kits to check for hidden blood in the stool.  A small camera at the end of a tube can be used to examine your  colon directly (sigmoidoscopy or colonoscopy). This is done to check for the earliest forms of colorectal cancer.  Routine screening usually begins at age 67.  Direct examination of the colon should be repeated every 5-10 years through 75 years of age. However, you may need to be screened more often if early forms of precancerous polyps or small growths are found.  Skin Cancer  Check your skin from head to toe regularly.  Tell your health care provider about any new moles or changes in moles, especially if there is a change in a mole's shape or color.  Also tell your health care provider if you have a mole that is larger than the size of a pencil eraser.  Always use sunscreen. Apply sunscreen liberally and repeatedly throughout the day.  Protect yourself by wearing long sleeves, pants, a wide-brimmed hat, and sunglasses whenever you are outside.  Heart disease, diabetes, and high blood pressure  High blood pressure causes heart disease and  increases the risk of stroke. High blood pressure is more likely to develop in: ? People who have blood pressure in the high end of the normal range (130-139/85-89 mm Hg). ? People who are overweight or obese. ? People who are African American.  If you are 62-61 years of age, have your blood pressure checked every 3-5 years. If you are 44 years of age or older, have your blood pressure checked every year. You should have your blood pressure measured twice-once when you are at a hospital or clinic, and once when you are not at a hospital or clinic. Record the average of the two measurements. To check your blood pressure when you are not at a hospital or clinic, you can use: ? An automated blood pressure machine at a pharmacy. ? A home blood pressure monitor.  If you are between 17 years and 38 years old, ask your health care provider if you should take aspirin to prevent strokes.  Have regular diabetes screenings. This involves taking a blood sample  to check your fasting blood sugar level. ? If you are at a normal weight and have a low risk for diabetes, have this test once every three years after 75 years of age. ? If you are overweight and have a high risk for diabetes, consider being tested at a younger age or more often. Preventing infection Hepatitis B  If you have a higher risk for hepatitis B, you should be screened for this virus. You are considered at high risk for hepatitis B if: ? You were born in a country where hepatitis B is common. Ask your health care provider which countries are considered high risk. ? Your parents were born in a high-risk country, and you have not been immunized against hepatitis B (hepatitis B vaccine). ? You have HIV or AIDS. ? You use needles to inject street drugs. ? You live with someone who has hepatitis B. ? You have had sex with someone who has hepatitis B. ? You get hemodialysis treatment. ? You take certain medicines for conditions, including cancer, organ transplantation, and autoimmune conditions.  Hepatitis C  Blood testing is recommended for: ? Everyone born from [birth year] through 1964. ? Anyone with known risk factors for hepatitis C.  Sexually transmitted infections (STIs)  You should be screened for sexually transmitted infections (STIs) including gonorrhea and chlamydia if: ? You are sexually active and are younger than 75 years of age. ? You are older than 75 years of age and your health care provider tells you that you are at risk for this type of infection. ? Your sexual activity has changed since you were last screened and you are at an increased risk for chlamydia or gonorrhea. Ask your health care provider if you are at risk.  If you do not have HIV, but are at risk, it may be recommended that you take a prescription medicine daily to prevent HIV infection. This is called pre-exposure prophylaxis (PrEP). You are considered at risk if: ? You are sexually active and do not  regularly use condoms or know the HIV status of your partner(s). ? You take drugs by injection. ? You are sexually active with a partner who has HIV.  Talk with your health care provider about whether you are at high risk of being infected with HIV. If you choose to begin PrEP, you should first be tested for HIV. You should then be tested every 3 months for as long as you are taking  PrEP. Pregnancy  If you are premenopausal and you may become pregnant, ask your health care provider about preconception counseling.  If you may become pregnant, take 400 to 800 micrograms (mcg) of folic acid every day.  If you want to prevent pregnancy, talk to your health care provider about birth control (contraception). Osteoporosis and menopause  Osteoporosis is a disease in which the bones lose minerals and strength with aging. This can result in serious bone fractures. Your risk for osteoporosis can be identified using a bone density scan.  If you are 62 years of age or older, or if you are at risk for osteoporosis and fractures, ask your health care provider if you should be screened.  Ask your health care provider whether you should take a calcium or vitamin D supplement to lower your risk for osteoporosis.  Menopause may have certain physical symptoms and risks.  Hormone replacement therapy may reduce some of these symptoms and risks. Talk to your health care provider about whether hormone replacement therapy is right for you. Follow these instructions at home:  Schedule regular health, dental, and eye exams.  Stay current with your immunizations.  Do not use any tobacco products including cigarettes, chewing tobacco, or electronic cigarettes.  If you are pregnant, do not drink alcohol.  If you are breastfeeding, limit how much and how often you drink alcohol.  Limit alcohol intake to no more than 1 drink per day for nonpregnant women. One drink equals 12 ounces of beer, 5 ounces of wine, or  1 ounces of hard liquor.  Do not use street drugs.  Do not share needles.  Ask your health care provider for help if you need support or information about quitting drugs.  Tell your health care provider if you often feel depressed.  Tell your health care provider if you have ever been abused or do not feel safe at home. This information is not intended to replace advice given to you by your health care provider. Make sure you discuss any questions you have with your health care provider. Document Released: 03/28/2011 Document Revised: 02/18/2016 Document Reviewed: 06/16/2015 Elsevier Interactive Patient Education  [employer].

## 2017-08-21 NOTE — Assessment & Plan Note (Signed)
Advanced directive discussion - has at home. HCPOA is son Catalina [place] and [place]). Will bring me copy.

## 2017-08-21 NOTE — Assessment & Plan Note (Signed)
Chronic, stable period.  

## 2017-08-21 NOTE — Assessment & Plan Note (Signed)
Ongoing. Did not tolerate gabapentin, lyrica or TCA in the past.

## 2017-08-21 NOTE — Telephone Encounter (Signed)
Spoke with [facility] and clarified which rx Dr. Darnell Level was sending. Says they will get it ready for pt.

## 2017-08-21 NOTE — Assessment & Plan Note (Signed)
Chronic, LDL above goal. Pt agrees to re-trial crestor QOD dosing. H/o statin intolerance.

## 2017-08-21 NOTE — Telephone Encounter (Signed)
Copied from [place]. Topic: General - Other >> Aug 21, 2017 10:46 AM Oneta Rack wrote: Osvaldo Human name: Audelia Acton from [facility] [place], [place] [facility] [place] [phone] ([phone] (Fax)    Reason for call:  As per pharmacist requesting clarity regarding rosuvastatin (CRESTOR) 5 MG tablet direction, stating 2 Rx were received with 2 different directions, please advise

## 2017-08-22 ENCOUNTER — Encounter: Payer: Self-pay | Admitting: Family Medicine

## 2017-08-24 ENCOUNTER — Telehealth: Payer: Self-pay | Admitting: Family Medicine

## 2017-08-24 ENCOUNTER — Encounter: Payer: Self-pay | Admitting: Family Medicine

## 2017-08-24 ENCOUNTER — Other Ambulatory Visit: Payer: Self-pay | Admitting: Pharmacy Technician

## 2017-08-24 NOTE — Patient Outreach (Signed)
[facility]) Care Management  08/24/2017  Cassandra Benson 01/04/1941 [id number]  Incoming [employer] call in reference to medication adherence. HIPAA identifiers verified and verbal consent received. Due to patient's dose changes on Lisinopril 10 mg and Rosuvastatin 5 mg her medication is lasting longer. The patient states she is taking 5 mg of Lisinopril daily and 5 mg of Rosuvastatin once weekly. Her current prescription's do not have the correct dosage. I contacted the office of Dr. Danise Mina to request updated prescription's be sent in to [facility] The patient has plenty of medication currently and will use what she has but still needs an updated prescription's on file.  Doreene Burke, [place] [phone]

## 2017-08-24 NOTE — Telephone Encounter (Signed)
Copied from [place] [phone]. Topic: Quick Communication - Rx Refill/Question >> Aug 24, 2017  1:54 PM Marin Olp L wrote: Has the patient contacted their pharmacy? Yes.   (Agent: If no, request that the patient contact the pharmacy for the refill.) Preferred Pharmacy (with phone number or street name): [facility] [address] - [place], [place] Agent: Please be advised that RX refills may take up to 48 hours. We ask that you follow-up with your pharmacy. Lisinopril 5mg  tab (once per day) 90 days supply, rosuvastatin 5mg  tab 12 tablets for 84 days supply (taken once a week) Directions have changed so script needs to be renewed.

## 2017-08-29 NOTE — Telephone Encounter (Signed)
error 

## 2017-08-31 ENCOUNTER — Telehealth: Payer: Self-pay | Admitting: Internal Medicine

## 2017-08-31 NOTE — Telephone Encounter (Signed)
Pt called to let G[place]he know Medicare may make contact. Pt ordered new insulin pump it was under warranty and the one she had quit working.

## 2017-09-13 ENCOUNTER — Ambulatory Visit (INDEPENDENT_AMBULATORY_CARE_PROVIDER_SITE_OTHER): Payer: PPO

## 2017-09-13 DIAGNOSIS — I6523 Occlusion and stenosis of bilateral carotid arteries: Secondary | ICD-10-CM | POA: Diagnosis not present

## 2017-09-20 ENCOUNTER — Encounter: Payer: Self-pay | Admitting: Family Medicine

## 2017-09-20 ENCOUNTER — Ambulatory Visit: Payer: PPO | Admitting: Family Medicine

## 2017-09-20 VITALS — BP 130/64 | HR 104 | Temp 98.0°F | Wt 151.0 lb

## 2017-09-20 DIAGNOSIS — I10 Essential (primary) hypertension: Secondary | ICD-10-CM

## 2017-09-20 DIAGNOSIS — E139 Other specified diabetes mellitus without complications: Secondary | ICD-10-CM

## 2017-09-20 DIAGNOSIS — E039 Hypothyroidism, unspecified: Secondary | ICD-10-CM

## 2017-09-20 DIAGNOSIS — Z8249 Family history of ischemic heart disease and other diseases of the circulatory system: Secondary | ICD-10-CM

## 2017-09-20 DIAGNOSIS — I6523 Occlusion and stenosis of bilateral carotid arteries: Secondary | ICD-10-CM

## 2017-09-20 DIAGNOSIS — E109 Type 1 diabetes mellitus without complications: Secondary | ICD-10-CM

## 2017-09-20 DIAGNOSIS — E7800 Pure hypercholesterolemia, unspecified: Secondary | ICD-10-CM | POA: Diagnosis not present

## 2017-09-20 MED ORDER — ZOSTER VAC RECOMB ADJUVANTED 50 MCG/0.5ML IM SUSR: 0.5000 mL | Freq: Once | INTRAMUSCULAR | 1 refills | Status: AC

## 2017-09-20 MED ORDER — KRILL OIL 500 MG PO CAPS
2.0000 | ORAL_CAPSULE | Freq: Every day | ORAL | Status: DC
Start: 2017-09-20 — End: 2020-08-04

## 2017-09-20 NOTE — Assessment & Plan Note (Signed)
Reviewed recent carotid US with patient - noted progression. ?6 mo check vs waiting a year. She will touch base with cards at next appt regarding this.

## 2017-09-20 NOTE — Progress Notes (Signed)
BP 130/64 (BP Location: Left Arm, Patient Position: Sitting, Cuff Size: Normal)   Pulse (!) 104   Temp 98 F (36.7 C) (Oral)   Wt 151 lb (68.5 kg)   SpO2 97%   BMI 26.12 kg/m    CC: discuss carotid US Subjective:    Patient ID: Cassandra Benson, female    DOB: 1941-06-05, 76 y.o.   MRN: [id number]  HPI: Cassandra Benson is a 76 y.o. female presenting on 09/20/2017 for Discuss imaging results (Carotid US 09/13/17. Wants to discuss chol) and Immunizations (Wants to discuss Shingrix vaccine)   Recent carotid US showed progression of known L carotid stenosis to 60-79% ICA stenosis. Was recommended f/u 12 months.   Known HLD - known statin and zetia intolerance. repatha/praluent was not affordable. Has seen cardiology lipid clinic. Last visit we re-trialed crestor 5mg  QOD dosing. She is tolerating this ok - feels it is exacerbating RLS, associated with leg aches and burning. But coq 10 helps. She also takes krill oil 1000-2000mg  daily.  Lab Results  Component Value Date   CHOL 251 (H) 08/11/2017   HDL 40.00 08/11/2017   LDLCALC 179 (H) 08/11/2017   LDLDIRECT 177.0 08/09/2016   TRIG 159.0 (H) 08/11/2017   CHOLHDL 6 08/11/2017   Lab Results  Component Value Date   HGBA1C 7.3 06/29/2017     She is frustrated because she has very healthy diet, avoids meats and chol remains high - aware of hereditary component. She brings extensive family history written out which will be scanned into chart.  She is also frustrated because of lack of eligibility for cholesterol treatment assistance despite her personal and family history.   Relevant past medical, surgical, family and social history reviewed and updated as indicated. Interim medical history since our last visit reviewed. Allergies and medications reviewed and updated. Outpatient Medications Prior to Visit  Medication Sig Dispense Refill  . aspirin EC 81 MG tablet Take 81 mg by mouth daily.    . BD ULTRA-FINE LANCETS lancets Use as  instructed upto 6 times daily 600 each 2  . Cholecalciferol 50000 UNITS TABS Take 25,000 mg by mouth every 14 (fourteen) days. Take one tablet by mouth every 14 days (2 weeks)    . ciprofloxacin (CILOXAN) 0.3 % ophthalmic solution Place 1 drop into the left eye every 4 (four) hours while awake. 5 mL 0  . Cyanocobalamin (B-12 SL) Place 1 drop under the tongue daily.     Marland Kitchen glucose blood (CONTOUR NEXT TEST) test strip Use as instructed to check 4 times daily 400 each 5  . ibuprofen (ADVIL,MOTRIN) 600 MG tablet Take 1 tablet (600 mg total) by mouth every 8 (eight) hours as needed. 30 tablet 0  . insulin aspart (NOVOLOG) 100 UNIT/ML injection Use in an insulin pump upto 50 units per day 2 vial 6  . Lecithin 400 MG CAPS Take 1 capsule by mouth once a week.     Marland Kitchen lisinopril (PRINIVIL,ZESTRIL) 10 MG tablet take 1/2 tablet daily. 30 tablet 6  . metFORMIN (GLUCOPHAGE-XR) 500 MG 24 hr tablet Take 1 tablet (500 mg total) by mouth 3 (three) times daily. (Patient taking differently: Take 500 mg by mouth 2 (two) times daily. ) 270 tablet 3  . Multiple Vitamins-Minerals (PRESERVISION AREDS) TABS Take 2 tablets by mouth daily.    . pantoprazole (PROTONIX) 40 MG tablet Take 1 tablet (40 mg total) by mouth daily. 30 tablet 1  . rosuvastatin (CRESTOR) 5 MG tablet Take 1  tablet (5 mg total) by mouth every other day. 30 tablet 6  . SYNTHROID 75 MCG tablet Take by mouth 75 mcg daily 1 tab 6/7 days and 1.5 tabs 1/7 days. 90 tablet 3   No facility-administered medications prior to visit.      Per HPI unless specifically indicated in ROS section below Review of Systems     Objective:    BP 130/64 (BP Location: Left Arm, Patient Position: Sitting, Cuff Size: Normal)   Pulse (!) 104   Temp 98 F (36.7 C) (Oral)   Wt 151 lb (68.5 kg)   SpO2 97%   BMI 26.12 kg/m   Wt Readings from Last 3 Encounters:  09/20/17 151 lb (68.5 kg)  08/21/17 150 lb 8 oz (68.3 kg)  06/29/17 151 lb (68.5 kg)    Physical Exam    Constitutional: She appears well-developed and well-nourished. No distress.  Psychiatric: She has a normal mood and affect.  Nursing note and vitals reviewed.      Assessment & Plan:  Over 25 minutes were spent face-to-face with the patient during this encounter and >50% of that time was spent on counseling and coordination of care  Interested in shingrix - discussed this. Rx sent to pharmacy.  Problem List Items Addressed This Visit    Carotid stenosis, asymptomatic    Reviewed recent carotid US with patient - noted progression. ?6 mo check vs waiting a year. She will touch base with cards at next appt regarding this.       Essential hypertension    Chronic, well controlled.       Family history of premature CAD   Hyperlipidemia - Primary    Chronic with familial component. Continue crestor 5mg  QOD. Will order labs for end of January (completing 2 months on statin). Also encouraged she touch base with cardiology lipid clinic to see eligibility for PCSK9 inhibitors/investigational trials etc.  She had concerns over metformin causing hyperlipidemia - I reassured her I don't think this is the case, encouraged she touch base with endo.       Relevant Orders   Lipid panel   Hypothyroidism   Relevant Orders   TSH   T4, free   LADA (latent autoimmune diabetes in adults), managed as type 1 ([place])    Requests A1c next labwork prior to next endo appt.       Relevant Orders   Basic metabolic panel   Hemoglobin A1c       Follow up plan: Return if symptoms worsen or fail to improve.  [name], MD

## 2017-09-20 NOTE — Assessment & Plan Note (Signed)
Chronic with familial component. Continue crestor 5mg  QOD. Will order labs for end of January (completing 2 months on statin). Also encouraged she touch base with cardiology lipid clinic to see eligibility for PCSK9 inhibitors/investigational trials etc.  She had concerns over metformin causing hyperlipidemia - I reassured her I don't think this is the case, encouraged she touch base with endo.

## 2017-09-20 NOTE — Assessment & Plan Note (Signed)
Requests A1c next labwork prior to next endo appt.

## 2017-09-20 NOTE — Patient Instructions (Addendum)
Continue crestor 5mg  QOD for now.  I do think it's worth it to call over to the cardiology lipid clinic to see if there's any new investigational studies or to revisit repatha eligibility.  If interested, check with pharmacy about new 2 shot shingles series (shingrix).  Nice to see you today!  We will recheck cholesterol levels in 1-2 months.

## 2017-09-20 NOTE — Assessment & Plan Note (Signed)
Chronic, well-controlled. 

## 2017-09-22 ENCOUNTER — Encounter: Payer: Self-pay | Admitting: Family Medicine

## 2017-10-01 ENCOUNTER — Observation Stay
Admission: EM | Admit: 2017-10-01 | Discharge: 2017-10-02 | Disposition: A | Discharge status: Home / Self Care | Payer: PPO | Attending: Internal Medicine | Admitting: Internal Medicine

## 2017-10-01 ENCOUNTER — Emergency Department: Payer: PPO

## 2017-10-01 ENCOUNTER — Encounter: Payer: Self-pay | Admitting: Emergency Medicine

## 2017-10-01 ENCOUNTER — Other Ambulatory Visit: Payer: Self-pay

## 2017-10-01 DIAGNOSIS — R0789 Other chest pain: Principal | ICD-10-CM | POA: Insufficient documentation

## 2017-10-01 DIAGNOSIS — G4730 Sleep apnea, unspecified: Secondary | ICD-10-CM | POA: Insufficient documentation

## 2017-10-01 DIAGNOSIS — I6522 Occlusion and stenosis of left carotid artery: Secondary | ICD-10-CM | POA: Diagnosis not present

## 2017-10-01 DIAGNOSIS — Z794 Long term (current) use of insulin: Secondary | ICD-10-CM | POA: Insufficient documentation

## 2017-10-01 DIAGNOSIS — M797 Fibromyalgia: Secondary | ICD-10-CM | POA: Insufficient documentation

## 2017-10-01 DIAGNOSIS — E039 Hypothyroidism, unspecified: Secondary | ICD-10-CM | POA: Diagnosis not present

## 2017-10-01 DIAGNOSIS — Z79899 Other long term (current) drug therapy: Secondary | ICD-10-CM | POA: Diagnosis not present

## 2017-10-01 DIAGNOSIS — K760 Fatty (change of) liver, not elsewhere classified: Secondary | ICD-10-CM | POA: Diagnosis not present

## 2017-10-01 DIAGNOSIS — Z86718 Personal history of other venous thrombosis and embolism: Secondary | ICD-10-CM | POA: Diagnosis not present

## 2017-10-01 DIAGNOSIS — E785 Hyperlipidemia, unspecified: Secondary | ICD-10-CM | POA: Diagnosis not present

## 2017-10-01 DIAGNOSIS — E559 Vitamin D deficiency, unspecified: Secondary | ICD-10-CM | POA: Insufficient documentation

## 2017-10-01 DIAGNOSIS — I10 Essential (primary) hypertension: Secondary | ICD-10-CM | POA: Diagnosis not present

## 2017-10-01 DIAGNOSIS — I259 Chronic ischemic heart disease, unspecified: Secondary | ICD-10-CM

## 2017-10-01 DIAGNOSIS — Z8619 Personal history of other infectious and parasitic diseases: Secondary | ICD-10-CM | POA: Diagnosis not present

## 2017-10-01 DIAGNOSIS — E109 Type 1 diabetes mellitus without complications: Secondary | ICD-10-CM | POA: Diagnosis not present

## 2017-10-01 DIAGNOSIS — Z9641 Presence of insulin pump (external) (internal): Secondary | ICD-10-CM | POA: Diagnosis not present

## 2017-10-01 DIAGNOSIS — Z7982 Long term (current) use of aspirin: Secondary | ICD-10-CM | POA: Diagnosis not present

## 2017-10-01 DIAGNOSIS — R079 Chest pain, unspecified: Secondary | ICD-10-CM | POA: Diagnosis not present

## 2017-10-01 DIAGNOSIS — Z880 Allergy status to penicillin: Secondary | ICD-10-CM | POA: Insufficient documentation

## 2017-10-01 DIAGNOSIS — K219 Gastro-esophageal reflux disease without esophagitis: Secondary | ICD-10-CM | POA: Insufficient documentation

## 2017-10-01 DIAGNOSIS — Z91041 Radiographic dye allergy status: Secondary | ICD-10-CM | POA: Insufficient documentation

## 2017-10-01 DIAGNOSIS — Z8679 Personal history of other diseases of the circulatory system: Secondary | ICD-10-CM | POA: Diagnosis not present

## 2017-10-01 DIAGNOSIS — M329 Systemic lupus erythematosus, unspecified: Secondary | ICD-10-CM | POA: Insufficient documentation

## 2017-10-01 DIAGNOSIS — I6523 Occlusion and stenosis of bilateral carotid arteries: Secondary | ICD-10-CM

## 2017-10-01 DIAGNOSIS — G8929 Other chronic pain: Secondary | ICD-10-CM | POA: Diagnosis present

## 2017-10-01 DIAGNOSIS — Z87440 Personal history of urinary (tract) infections: Secondary | ICD-10-CM | POA: Diagnosis not present

## 2017-10-01 DIAGNOSIS — Z8249 Family history of ischemic heart disease and other diseases of the circulatory system: Secondary | ICD-10-CM | POA: Insufficient documentation

## 2017-10-01 LAB — CBC WITH DIFFERENTIAL/PLATELET
Basophils Absolute: 0.1 10*3/uL (ref 0–0.1)
Basophils Relative: 1 %
EOS ABS: 0.1 10*3/uL (ref 0–0.7)
Eosinophils Relative: 1 %
HEMATOCRIT: 39.8 % (ref 35.0–47.0)
HEMOGLOBIN: 13.3 g/dL (ref 12.0–16.0)
LYMPHS ABS: 2.1 10*3/uL (ref 1.0–3.6)
LYMPHS PCT: 22 %
MCH: 26.9 pg (ref 26.0–34.0)
MCHC: 33.5 g/dL (ref 32.0–36.0)
MCV: 80.3 fL (ref 80.0–100.0)
Monocytes Absolute: 0.6 10*3/uL (ref 0.2–0.9)
Monocytes Relative: 6 %
NEUTROS ABS: 6.7 10*3/uL — AB (ref 1.4–6.5)
NEUTROS PCT: 70 %
Platelets: 222 10*3/uL (ref 150–440)
RBC: 4.96 MIL/uL (ref 3.80–5.20)
RDW: 14.5 % (ref 11.5–14.5)
WBC: 9.6 10*3/uL (ref 3.6–11.0)

## 2017-10-01 LAB — TROPONIN I: Troponin I: <0.03 ng/mL (ref <0.03)

## 2017-10-01 LAB — BASIC METABOLIC PANEL
Anion gap: 11 (ref 5–15)
BUN: 12 mg/dL (ref 6–20)
CHLORIDE: 106 mmol/L (ref 101–111)
CO2: 23 mmol/L (ref 22–32)
Calcium: 9.5 mg/dL (ref 8.9–10.3)
Creatinine, Ser: 0.68 mg/dL (ref 0.44–1.00)
GFR calc Af Amer: >60 mL/min (ref >60)
GFR calc non Af Amer: >60 mL/min (ref >60)
Glucose, Bld: 231 mg/dL — ABNORMAL HIGH (ref 65–99)
POTASSIUM: 3.9 mmol/L (ref 3.5–5.1)
Sodium: 140 mmol/L (ref 135–145)

## 2017-10-01 LAB — GLUCOSE, CAPILLARY: Glucose-Capillary: 307 mg/dL — ABNORMAL HIGH (ref 65–99)

## 2017-10-01 LAB — FIBRIN DERIVATIVES D-DIMER (ARMC ONLY): Fibrin derivatives D-dimer (ARMC): 1074.06 ng/mL (FEU) — ABNORMAL HIGH (ref 0.00–499.00)

## 2017-10-01 MED ORDER — CHOLECALCIFEROL 1.25 MG (50000 UT) PO TABS: 25000.0000 mg | ORAL_TABLET | ORAL | Status: DC

## 2017-10-01 MED ORDER — LISINOPRIL 5 MG PO TABS
5.0000 mg | ORAL_TABLET | Freq: Every day | ORAL | Status: DC
  Administered 2017-10-02: 5 mg via ORAL
  Filled 2017-10-01: qty 1

## 2017-10-01 MED ORDER — DIPHENHYDRAMINE HCL 50 MG/ML IJ SOLN
50.0000 mg | Freq: Once | INTRAMUSCULAR | Status: AC
  Administered 2017-10-01: 50 mg via INTRAVENOUS
  Filled 2017-10-01: qty 1

## 2017-10-01 MED ORDER — DIPHENHYDRAMINE HCL 25 MG PO CAPS: 50.0000 mg | ORAL_CAPSULE | Freq: Once | ORAL | Status: DC

## 2017-10-01 MED ORDER — KRILL OIL 500 MG PO CAPS: 2.0000 | ORAL_CAPSULE | Freq: Every day | ORAL | Status: DC

## 2017-10-01 MED ORDER — NITROGLYCERIN 0.4 MG SL SUBL
0.4000 mg | SUBLINGUAL_TABLET | SUBLINGUAL | Status: DC | PRN
  Administered 2017-10-01 – 2017-10-02 (×2): 0.4 mg via SUBLINGUAL
  Filled 2017-10-01: qty 1

## 2017-10-01 MED ORDER — SODIUM CHLORIDE 0.9 % IV BOLUS (SEPSIS)
1000.0000 mL | Freq: Once | INTRAVENOUS | Status: AC
  Administered 2017-10-01: 1000 mL via INTRAVENOUS

## 2017-10-01 MED ORDER — DIPHENHYDRAMINE HCL 50 MG/ML IJ SOLN: 50.0000 mg | Freq: Once | INTRAMUSCULAR | Status: DC

## 2017-10-01 MED ORDER — MORPHINE SULFATE (PF) 2 MG/ML IV SOLN
0.5000 mg | INTRAVENOUS | Status: DC | PRN
  Administered 2017-10-01: 0.5 mg via INTRAVENOUS
  Filled 2017-10-01: qty 1

## 2017-10-01 MED ORDER — HYDROCORTISONE NA SUCCINATE PF 250 MG IJ SOLR
200.0000 mg | Freq: Once | INTRAMUSCULAR | Status: AC
  Administered 2017-10-01: 200 mg via INTRAVENOUS
  Filled 2017-10-01: qty 200

## 2017-10-01 MED ORDER — INSULIN ASPART 100 UNIT/ML ~~LOC~~ SOLN: 0.0000 [IU] | Freq: Three times a day (TID) | SUBCUTANEOUS | Status: DC

## 2017-10-01 MED ORDER — ONDANSETRON HCL 4 MG/2ML IJ SOLN: 4.0000 mg | Freq: Four times a day (QID) | INTRAMUSCULAR | Status: DC | PRN

## 2017-10-01 MED ORDER — ENOXAPARIN SODIUM 40 MG/0.4ML ~~LOC~~ SOLN: 40.0000 mg | SUBCUTANEOUS | Status: DC

## 2017-10-01 MED ORDER — OCUVITE-LUTEIN PO TABS
2.0000 | ORAL_TABLET | Freq: Every day | ORAL | Status: DC
  Administered 2017-10-02: 2 via ORAL
  Filled 2017-10-01: qty 2

## 2017-10-01 MED ORDER — MORPHINE SULFATE (PF) 2 MG/ML IV SOLN
2.0000 mg | Freq: Once | INTRAVENOUS | Status: AC
  Administered 2017-10-01: 2 mg via INTRAVENOUS
  Filled 2017-10-01: qty 1

## 2017-10-01 MED ORDER — LECITHIN 400 MG PO CAPS: 1.0000 | ORAL_CAPSULE | ORAL | Status: DC

## 2017-10-01 MED ORDER — ACETAMINOPHEN 325 MG PO TABS: 650.0000 mg | ORAL_TABLET | ORAL | Status: DC | PRN

## 2017-10-01 MED ORDER — METFORMIN HCL ER 500 MG PO TB24
500.0000 mg | ORAL_TABLET | Freq: Three times a day (TID) | ORAL | Status: DC
  Filled 2017-10-01 (×2): qty 1

## 2017-10-01 MED ORDER — ASPIRIN EC 81 MG PO TBEC
81.0000 mg | DELAYED_RELEASE_TABLET | Freq: Every day | ORAL | Status: DC
  Administered 2017-10-02: 81 mg via ORAL
  Filled 2017-10-01: qty 1

## 2017-10-01 MED ORDER — PANTOPRAZOLE SODIUM 40 MG PO TBEC
40.0000 mg | DELAYED_RELEASE_TABLET | Freq: Every day | ORAL | Status: DC
  Administered 2017-10-02: 40 mg via ORAL
  Filled 2017-10-01: qty 1

## 2017-10-01 MED ORDER — ROSUVASTATIN CALCIUM 5 MG PO TABS
5.0000 mg | ORAL_TABLET | ORAL | Status: DC
  Administered 2017-10-02: 5 mg via ORAL
  Filled 2017-10-01: qty 1

## 2017-10-01 MED ORDER — LEVOTHYROXINE SODIUM 50 MCG PO TABS
75.0000 ug | ORAL_TABLET | Freq: Every day | ORAL | Status: DC
  Administered 2017-10-02: 75 ug via ORAL
  Filled 2017-10-01: qty 1

## 2017-10-01 NOTE — ED Notes (Signed)
Pt declined the zofran - states she only wants the morphine but "not too much".

## 2017-10-01 NOTE — Progress Notes (Signed)
Cardiology Office Note  Date:  10/03/2017   ID:  DWANDA TUFANO, DOB [birth year], MRN [id number]  PCP:  [name], MD   Chief Complaint  Patient presents with  . Other    12 month follow up. Patient was in ED for chest pain. Meds reviewed verbally with Patient.     HPI:  Ms. Melcher is a very pleasant 77 year old woman with  15 years of diabetes, on insulin,  Hyperlipidemia, family history of coronary artery disease,  hypertension, who presents for follow-up of  prior episodes of chest pain,  carotid arterial disease, estimated at 60% Hyperlipidemia Who presents for routine follow-up of recent episodes of chest pain  Reports developing general malaise, felt cold Christmas Eve Went to ER, checked out okay  In the hospital yesterday with chest pain on way to church, described as severe pain, cinder block in the chest .  Still went to church felt exhausted, but pain had resolved  Went home from church, slept 2 hours Then developed tingling in jaw, Called triage nurse,.  they recommended she call 911  called 911, Went to the ER, Hospital records reviewed with the patient in detail Ruled out TNT, EKG unrevealing Stress test low risk no ischemia D-dimer was elevated She had CT scan no aortic dissection Echo normal EF 65% No further symptoms, discharged home  Currently feels fine, no further chest pain, etiology unclear Reports that she was given nitroglycerin for further episodes of pain  Able to tolerate Crestor 5 mg daily She did not get flu shot, reports that she could not walk after previous flu shot  Lab work reviewed on today's visit Total chol 245, LDL 177 HBA1C 7.6 CT scan chest images pulled up and reviewed in detail no significant  Aortic atherosclerosis, no significant coronary calcification noted  EKG on today's visit shows normal sinus rhythm with rate 90 bpm, no significant ST or T-wave changes  Other past medical history reviewed  stressful 01-12-2016  husband died in 2016-01-12, cancer Was diagnosed late spring, died several months later from metastatic disease lonely at nighttime  Other past medical history reviewed Previous episodes of chest pain,  woke up with some chest discomfort.  Went to the emergency room, cardiac enzymes negative 3,  chest CT with no PE.   She does report prior cardiac catheterization in 01/12/99 showing no significant disease CT scan done 04/2015 in the emergency room shows no coronary calcifications, minimal minimal descending aorta atherosclerosis   PMH:   has a past medical history of Allergy, Arthritis, Carotid stenosis, asymptomatic (06/19/2015), Diabetes mellitus without complication ([place]), Fibromyalgia, GERD (gastroesophageal reflux disease), Glaucoma, History of blood clots, History of chicken pox, History of diverticulitis, History of pericarditis (1986), History of pneumonia (01/11/2013), History of shingles, History of UTI, Hyperlipidemia, Hypothyroidism, Lupus (systemic lupus erythematosus) ([place]), Shoulder pain (left), Sleep apnea, and Vitamin D deficiency.  PSH:    Past Surgical History:  Procedure Laterality Date  . ABDOMINAL HYSTERECTOMY  1978   fibroids and menorrhagia, ovaries remain  . [facility] normal per patient  . COLONOSCOPY WITH ESOPHAGOGASTRODUODENOSCOPY (EGD)  03/2007   2 ulcers, benign polyp, rpt 5 yrs ([place], [place])  . PARTIAL HIP ARTHROPLASTY  01-12-12   Right hip replacement  . TONSILLECTOMY AND ADENOIDECTOMY    . VAGINAL DELIVERY     x2, no complications    Current Outpatient Medications  Medication Sig Dispense Refill  . aspirin EC 81 MG tablet Take 81  mg by mouth daily.    . BD ULTRA-FINE LANCETS lancets Use as instructed upto 6 times daily 600 each 2  . Cholecalciferol 50000 UNITS TABS Take 25,000 mg by mouth every 14 (fourteen) days. Take one tablet by mouth every 14 days (2 weeks)    . ciprofloxacin (CILOXAN) 0.3 % ophthalmic solution Place  1 drop into the left eye every 4 (four) hours while awake. 5 mL 0  . Cyanocobalamin (B-12 SL) Place 1 drop under the tongue daily.     Marland Kitchen glucose blood (CONTOUR NEXT TEST) test strip Use as instructed to check 4 times daily 400 each 5  . ibuprofen (ADVIL,MOTRIN) 600 MG tablet Take 1 tablet (600 mg total) by mouth every 8 (eight) hours as needed. 30 tablet 0  . insulin aspart (NOVOLOG) 100 UNIT/ML injection Use in an insulin pump upto 50 units per day 2 vial 6  . Krill Oil 500 MG CAPS Take 2 capsules (1,000 mg total) by mouth daily.    . Lecithin 400 MG CAPS Take 1 capsule by mouth 3 (three) times a week.     . levothyroxine (SYNTHROID) 75 MCG tablet Take 1 tablet (75 mcg total) by mouth daily before breakfast. 30 tablet 0  . lisinopril (PRINIVIL,ZESTRIL) 10 MG tablet Take 5 mg by mouth daily.  30 tablet 6  . Multiple Vitamins-Minerals (PRESERVISION AREDS) TABS Take 2 tablets by mouth daily.    . nitroGLYCERIN (NITROSTAT) 0.4 MG SL tablet Place 1 tablet (0.4 mg total) under the tongue every 5 (five) minutes as needed for chest pain. 30 tablet 0  . pantoprazole (PROTONIX) 40 MG tablet Take 1 tablet (40 mg total) by mouth daily. 30 tablet 1  . rosuvastatin (CRESTOR) 5 MG tablet Take 1 tablet (5 mg total) by mouth every other day. 30 tablet 6   No current facility-administered medications for this visit.      Allergies:   Iodinated diagnostic agents; Penicillins; Codeine; Gabapentin; Influenza vaccines; Nortriptyline; Pamelor [nortriptyline hcl]; Valsartan; Erythromycin; and Sulfa antibiotics   Social History:  The patient  reports that  has never smoked. she has never used smokeless tobacco. She reports that she does not drink alcohol or use drugs.   Family History:   family history includes Alcohol abuse in her brother; Breast cancer in her maternal aunt and maternal aunt; CAD (age of onset: 54) in her brother; CAD (age of onset: 80) in her brother; CAD (age of onset: 48) in her father and mother;  CAD (age of onset: 78) in her sister; COPD in her brother and mother; Cancer in her maternal aunt; Depression in her grandchild; Diabetes in her brother and sister; [name] in her mother; Lupus in her mother; Rheum arthritis in her mother; Seizures in her son; Stroke in her brother and sister.    Review of Systems: Review of Systems  Constitutional: Negative.   Respiratory: Negative.   Cardiovascular: Positive for chest pain.  Gastrointestinal: Negative.   Musculoskeletal: Negative.   Neurological: Negative.   Psychiatric/Behavioral: Negative.   All other systems reviewed and are negative.    PHYSICAL EXAM: VS:  BP (!) 146/74 (BP Location: Left Arm, Patient Position: Sitting, Cuff Size: Normal)   Pulse 90   Ht 5\' 3"  (1.6 m)   Wt 153 lb (69.4 kg)   BMI 27.10 kg/m  , BMI Body mass index is 27.1 kg/m. GEN: Well nourished, well developed, in no acute distress  HEENT: normal  Neck: no JVD, carotid bruits, or masses  Cardiac: RRR; no murmurs, rubs, or gallops,no edema  Respiratory:  clear to auscultation bilaterally, normal work of breathing GI: soft, nontender, nondistended, + BS MS: no deformity or atrophy  Skin: warm and dry, no rash Neuro:  Strength and sensation are intact Psych: euthymic mood, full affect    Recent Labs: 08/11/2017: ALT 14; TSH 0.24 10/01/2017: BUN 12; Creatinine, Ser 0.68; Hemoglobin 13.3; Platelets 222; Potassium 3.9; Sodium 140    Lipid Panel Lab Results  Component Value Date   CHOL 251 (H) 08/11/2017   HDL 40.00 08/11/2017   LDLCALC 179 (H) 08/11/2017   TRIG 159.0 (H) 08/11/2017      Wt Readings from Last 3 Encounters:  10/03/17 153 lb (69.4 kg)  10/01/17 151 lb 6.4 oz (68.7 kg)  09/20/17 151 lb (68.5 kg)       ASSESSMENT AND PLAN:  Essential hypertension - Plan: EKG 12-Lead Blood pressure is well controlled on today's visit. No changes made to the medications.  LADA (latent autoimmune diabetes in adults), managed as type 1  ([place]) Stressed importance of low carbohydrate diet Exercise program   bilateral carotid artery stenosis Progression of disease now 60-79% disease on the left We will inquire as to whether she is a candidate for repatha or praluent  Adjustment disorder with mixed anxiety and depressed mood Recent loss of her husband, doing better, lonely at night  Chest pain Recent workup in the hospital including stress test, echo, CT scan Etiology unclear Long discussion with her concerning other options,  Possible GI spasm she can try nitro  Recommend she try Tums, Pepcid, Coca-Cola for gas No mention on scan concerning hiatal hernia She has had previous episodes of chest pain that have been atypical  Pure hypercholesterolemia Difficulty tolerating more than Crestor 5 mg daily Difficulty tolerating Zetia Will determine whether she will qualify for Repatha or praluent Goal LDL less than 70 given PAD, carotid disease   Total encounter time more than 45 minutes  Greater than 50% was spent in counseling and coordination of care with the patient   Disposition:   F/U  12 months   Orders Placed This Encounter  Procedures  . EKG 12-Lead     Signed, Esmond Plants, M.D., Ph.D. 10/03/2017  [place], [place]

## 2017-10-01 NOTE — ED Provider Notes (Signed)
[facility] Emergency Department Provider Note  ____________________________________________   First MD Initiated Contact with Patient 10/01/17 1548     (approximate)  I have reviewed the triage vital signs and the nursing notes.   HISTORY  Chief Complaint Chest Pain    HPI KYNSLEE BAHAM is a 77 y.o. 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  Says the chest pain started this morning before she went to church and was sudden onset, severe and stabbing pain across the front of her chest and radiating through to her back.  She says that it started to come down after several minutes but that she has had persistent discomfort in her chest throughout the day as well as weakness.  She is denying any shortness of breath at this time.  Denies any heavy lifting or injury.  Denies that movement worsens the pain.  No alleviating factors.  Denies any nausea or vomiting.  No history of coronary artery disease.  Patient reports that she has the lingering "discomfort" in her chest is a 7 out of 10.   Past Medical History:  Diagnosis Date  . Allergy   . Arthritis   . Carotid stenosis, asymptomatic 06/19/2015   7-98% RICA 92-11% LICA rpt 1 yr (05/4173)   . Diabetes mellitus without complication ([place])   . Fibromyalgia    prior PCP  . GERD (gastroesophageal reflux disease)    prior PCP  . Glaucoma    Narrow angle  . History of blood clots    DVT, in 20s, none since  . History of chicken pox   . History of diverticulitis   . History of pericarditis 1986   with hospitalization  . History of pneumonia 2014  . History of shingles   . History of UTI   . Hyperlipidemia   . Hypothyroidism   . Lupus (systemic lupus erythematosus) ([place])   . Shoulder pain left   h/o RTC tendonitis and adhesive capsulitis  . Sleep apnea    prior PCP  . Vitamin D deficiency    prior PCP     Patient Active Problem List   Diagnosis Date Noted  . 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 Intractable episodic headache 03/13/2017  . Numbness and tingling of both lower extremities 03/13/2017  . 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 Internal hordeolum of left eye 03/03/2017  . Fatty liver 02/13/2017  . Lightheadedness 12/28/2016  . LADA (latent autoimmune diabetes in adults), managed as type 1 ([place]) 10/03/2016  . Right shoulder pain 08/15/2016  . Grief counseling 02/02/2016  . Medicare annual wellness visit, subsequent 06/19/2015  . Health maintenance examination 06/19/2015  . Advanced care planning/counseling discussion 06/19/2015  . Carotid stenosis, asymptomatic 06/19/2015  . Vitamin D deficiency   . Family history of premature CAD 06/02/2015  . Chest pain 05/26/2015  . Midline low back pain 09/25/2014  . 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 Hyperlipidemia 07/30/2014  . Essential hypertension 07/17/2014  . Hypothyroidism 07/17/2014  . Adjustment disorder with mixed anxiety and depressed mood 07/17/2014  . Apnea, sleep 08/22/2012  . LBP (low back pain) 02/27/2012  . SLE (systemic lupus erythematosus) ([place]) 01/06/2012    Past Surgical History:  Procedure Laterality Date  . ABDOMINAL HYSTERECTOMY  1978   fibroids and menorrhagia, ovaries remain  . [facility] normal  per patient  . COLONOSCOPY WITH ESOPHAGOGASTRODUODENOSCOPY (EGD)  03/2007   2 ulcers, benign polyp, rpt 5 yrs ([employer], [place])  . PARTIAL HIP ARTHROPLASTY  2013   Right hip replacement  . TONSILLECTOMY AND ADENOIDECTOMY    . VAGINAL DELIVERY     x2, no complications    Prior to Admission medications   Medication Sig Start Date End Date Taking? Authorizing Provider  aspirin EC 81 MG tablet Take 81 mg by mouth daily.   Yes [provider]  Cholecalciferol 50000 UNITS TABS Take 25,000 mg by mouth every 14  (fourteen) days. Take one tablet by mouth every 14 days (2 weeks)   Yes [provider]  ciprofloxacin (CILOXAN) 0.3 % ophthalmic solution Place 1 drop into the left eye every 4 (four) hours while awake. 03/03/17  Yes [name], MD  Cyanocobalamin (B-12 SL) Place 1 drop under the tongue daily.    Yes [provider]  ibuprofen (ADVIL,MOTRIN) 600 MG tablet Take 1 tablet (600 mg total) by mouth every 8 (eight) hours as needed. 03/17/16  Yes Jearld Fenton, NP  insulin aspart (NOVOLOG) 100 UNIT/ML injection Use in an insulin pump upto 50 units per day 10/03/16  Yes Philemon Kingdom, MD  Javier Docker Oil 500 MG CAPS Take 2 capsules (1,000 mg total) by mouth daily. 09/20/17  Yes [name], MD  Lecithin 400 MG CAPS Take 1 capsule by mouth 3 (three) times a week.    Yes [provider]  lisinopril (PRINIVIL,ZESTRIL) 10 MG tablet Take 5 mg by mouth daily.  05/25/17  Yes [name], MD  metFORMIN (GLUCOPHAGE-XR) 500 MG 24 hr tablet Take 1 tablet (500 mg total) by mouth 3 (three) times daily. 01/04/17  Yes Philemon Kingdom, MD  Multiple Vitamins-Minerals (PRESERVISION AREDS) TABS Take 2 tablets by mouth daily.   Yes [provider]  pantoprazole (PROTONIX) 40 MG tablet Take 1 tablet (40 mg total) by mouth daily. 12/19/16  Yes [name], MD  rosuvastatin (CRESTOR) 5 MG tablet Take 1 tablet (5 mg total) by mouth every other day. 08/21/17  Yes [name], MD  SYNTHROID 75 MCG tablet Take by mouth 75 mcg daily 1 tab 6/7 days and 1.5 tabs 1/7 days. Patient taking differently: Take 75 mcg by mouth daily before breakfast.  01/04/17  Yes Philemon Kingdom, MD  BD ULTRA-FINE LANCETS lancets Use as instructed upto 6 times daily 09/02/14   Phadke, Radhika P, MD  glucose blood (CONTOUR NEXT TEST) test strip Use as instructed to check 4 times daily 07/03/17   Philemon Kingdom, MD    Allergies Iodinated diagnostic agents; Penicillins; Codeine; Gabapentin;  Influenza vaccines; Nortriptyline; Pamelor [nortriptyline hcl]; Valsartan; Erythromycin; and Sulfa antibiotics  Family History  Problem Relation Age of Onset  . CAD Mother 1       MI, aortic valve issues  . COPD Mother   . Lupus Mother   . Graves' disease Mother   . Rheum arthritis Mother   . CAD Father 66       CABG x2, aortic valve replacement  . Stroke Sister   . Alcohol abuse Brother   . CAD Brother 4       MI  . Stroke Brother   . Seizures Son   . COPD Brother   . CAD Brother 61       stent  . Diabetes Brother   . Depression Grandchild   . Cancer Maternal Aunt        breast  .  Breast cancer Maternal Aunt   . Diabetes Sister        deceased  . CAD Sister 87       stents  . Breast cancer Maternal Aunt     Social History Social History   Tobacco Use  . Smoking status: Never Smoker  . Smokeless tobacco: Never Used  Substance Use Topics  . Alcohol use: No  . Drug use: No    Review of Systems  Constitutional: No fever/chills Eyes: No visual changes. ENT: No sore throat. Cardiovascular: As above Respiratory: Denies shortness of breath. Gastrointestinal: No abdominal pain.  No nausea, no vomiting.  No diarrhea.  No constipation. Genitourinary: Negative for dysuria. Musculoskeletal: Negative for back pain. Skin: Negative for rash. Neurological: Negative for headaches, focal weakness or numbness.   ____________________________________________   PHYSICAL EXAM:  VITAL SIGNS: ED Triage Vitals  Enc Vitals Group     BP 10/01/17 1540 (!) 145/80     Pulse Rate 10/01/17 1540 84     Resp 10/01/17 1540 13     Temp 10/01/17 1540 97.6 F (36.4 C)     Temp src --      SpO2 10/01/17 1540 98 %     Weight 10/01/17 1547 147 lb (66.7 kg)     Height 10/01/17 1547 5' 3.5" (1.613 m)     Head Circumference --      Peak Flow --      Pain Score --      Pain Loc --      Pain Edu? --      Excl. in [place]? --     Constitutional: Alert and oriented. Well appearing and  in no acute distress. Eyes: Conjunctivae are normal.  Head: Atraumatic. Nose: No congestion/rhinnorhea. Mouth/Throat: Mucous membranes are moist.  Neck: No stridor.   Cardiovascular: Normal rate, regular rhythm. Grossly normal heart sounds.  Good peripheral circulation with equal and bilateral radial as well as dorsalis pedis pulses.  Chest pain is reproducible to palpation across the anterior chest wall.  There is no ecchymosis, crepitus or deformity. Respiratory: Normal respiratory effort.  No retractions. Lungs CTAB. Gastrointestinal: Soft and nontender. No distention.  Musculoskeletal: No lower extremity tenderness nor edema.  No joint effusions. Neurologic:  Normal speech and language. No gross focal neurologic deficits are appreciated. Skin:  Skin is warm, dry and intact. No rash noted. Psychiatric: Mood and affect are normal. Speech and behavior are normal.  ____________________________________________   LABS (all labs ordered are listed, but only abnormal results are displayed)  Labs Reviewed  CBC WITH DIFFERENTIAL/PLATELET - Abnormal; Notable for the following components:      Result Value   Neutro Abs 6.7 (*)    All other components within normal limits  BASIC METABOLIC PANEL - Abnormal; Notable for the following components:   Glucose, Bld 231 (*)    All other components within normal limits  FIBRIN DERIVATIVES D-DIMER ([facility] ONLY) - Abnormal; Notable for the following components:   Fibrin derivatives D-dimer ([facility]) 1,074.06 (*)    All other components within normal limits  TROPONIN I   ____________________________________________  EKG  ED ECG REPORT I, Doran Stabler, the attending physician, personally viewed and interpreted this ECG.   Date: 10/01/2017  EKG Time: 1555  Rate: 77  Rhythm: normal sinus rhythm  Axis: Normal  Intervals:none  ST&T Change: No ST segment elevation or depression.  No abnormal T wave  inversion.  ____________________________________________  RADIOLOGY  Active disease on the chest x-ray  ____________________________________________   PROCEDURES  Procedure(s) performed:   Procedures  Critical Care performed:   ____________________________________________   INITIAL IMPRESSION / ASSESSMENT AND PLAN / ED COURSE  Pertinent labs & imaging results that were available during my care of the patient were reviewed by me and considered in my medical decision making (see chart for details).  Differential diagnosis includes, but is not limited to, ACS, aortic dissection, pulmonary embolism, cardiac tamponade, pneumothorax, pneumonia, pericarditis, myocarditis, GI-related causes including esophagitis/gastritis, and musculoskeletal chest wall pain.   As part of my medical decision making, I reviewed the following data within the electronic MEDICAL RECORD NUMBER Notes from prior ED visits  ----------------------------------------- 7:36 PM on 10/01/2017 -----------------------------------------  Patient with elevated d-dimer.  Says that the chest pressure has reduced since the morphine.  She showed me a paper of multiple family members who died in their 30s and 72s from coronary artery disease.  Because of the patient's presentation as well as her history she will be admitted to the hospital.  She is also pending a CT angiography of her chest to rule out aortic dissection.  However, she will require a prolonged prep because of past allergic reaction.  I discussed this with the admitting doctor, Dr. Posey Pronto.  The patient is aware of the diagnosis and treatment plan and willing to comply.      ____________________________________________   FINAL CLINICAL IMPRESSION(S) / ED DIAGNOSES  Chest pain.    NEW MEDICATIONS STARTED DURING THIS VISIT:  This SmartLink is deprecated. Use AVSMEDLIST instead to display the medication list for a patient.   Note:  This document was prepared  using Dragon voice recognition software and may include unintentional dictation errors.     Orbie Pyo, MD 10/01/17 [phone]

## 2017-10-01 NOTE — Progress Notes (Signed)
PHARMACIST - PHYSICIAN ORDER COMMUNICATION  CONCERNING: P&T Medication Policy on Herbal Medications  DESCRIPTION:  This patient's order for:  Krill oil, lecithin  has been noted.  This product(s) is classified as an "herbal" or natural product. Due to a lack of definitive safety studies or FDA approval, nonstandard manufacturing practices, plus the potential risk of unknown drug-drug interactions while on inpatient medications, the Pharmacy and Therapeutics Committee does not permit the use of "herbal" or natural products of this type within [facility].   ACTION TAKEN: The pharmacy department is unable to verify this order at this time and your patient has been informed of this safety policy. Please reevaluate patient's clinical condition at discharge and address if the herbal or natural product(s) should be resumed at that time.

## 2017-10-01 NOTE — Progress Notes (Signed)
Patient ID: Cassandra Benson, female   DOB: [birth year], 77 y.o.   MRN: [id number]  ACP family meeting note  Patient is admitted with chest pain rule out ACS.  She is hemodynamically stable.  She has history of type 1 diabetes and left carotid artery stenosis Prognosis fair CODE STATUS discussed with patient.  And requests full code.   Time spent 16 minutes

## 2017-10-01 NOTE — ED Notes (Signed)
Report given to the floor. 

## 2017-10-01 NOTE — ED Notes (Signed)
Pt denies pain - pressure only. Explained we are trying nitro to see if it makes a difference in that feeling.

## 2017-10-01 NOTE — ED Triage Notes (Signed)
Chest pressure this am 845am then went into her back. Pt continued to church but states wasn't "feeling right".  Back and rt elbow pain.

## 2017-10-01 NOTE — H&P (Signed)
[facility] [place] NAME: Cassandra Benson    MR#:  [id number]  DATE OF BIRTH:  [birth year]  DATE OF ADMISSION:  10/01/2017  PRIMARY CARE PHYSICIAN: [name], MD   REQUESTING/REFERRING PHYSICIAN: Dr. Clearnce Hasten  CHIEF COMPLAINT:  Chest pain  HISTORY OF PRESENT ILLNESS:  Cassandra Benson  is a 77 y.o. female with a known history of carotid artery stenosis on the left, GERD, fibromyalgia, diabetes type 1 on insulin pump comes to the emergency room after she started having chest tightness radiating to the back when she went to church.  Patient did not feel well all day decided to call the primary care physician's office who asked her to come to the emergency room. She received IV morphine and since then has been feeling better.  This denies any radiation of pain shortness of breath nausea or vomiting Patient is being admitted for chest pain rule out.  Her first set of cardiac enzyme is negative Not have any known coronary artery disease however has a very strong family history of coronary artery disease.  PAST MEDICAL HISTORY:   Past Medical History:  Diagnosis Date  . Allergy   . Arthritis   . Carotid stenosis, asymptomatic 06/19/2015   1-44% RICA 81-85% LICA rpt 1 yr (02/3148)   . Diabetes mellitus without complication ([place])   . Fibromyalgia    prior PCP  . GERD (gastroesophageal reflux disease)    prior PCP  . Glaucoma    Narrow angle  . History of blood clots    DVT, in 20s, none since  . History of chicken pox   . History of diverticulitis   . History of pericarditis 1986   with hospitalization  . History of pneumonia 2014  . History of shingles   . History of UTI   . Hyperlipidemia   . Hypothyroidism   . Lupus (systemic lupus erythematosus) ([place])   . Shoulder pain left   h/o RTC tendonitis and adhesive capsulitis  . Sleep apnea    prior PCP  . Vitamin D deficiency    prior PCP    PAST SURGICAL HISTOIRY:    Past Surgical History:  Procedure Laterality Date  . ABDOMINAL HYSTERECTOMY  1978   fibroids and menorrhagia, ovaries remain  . [facility] normal per patient  . COLONOSCOPY WITH ESOPHAGOGASTRODUODENOSCOPY (EGD)  03/2007   2 ulcers, benign polyp, rpt 5 yrs ([place], [place])  . PARTIAL HIP ARTHROPLASTY  2013   Right hip replacement  . TONSILLECTOMY AND ADENOIDECTOMY    . VAGINAL DELIVERY     x2, no complications    SOCIAL HISTORY:   Social History   Tobacco Use  . Smoking status: Never Smoker  . Smokeless tobacco: Never Used  Substance Use Topics  . Alcohol use: No    FAMILY HISTORY:   Family History  Problem Relation Age of Onset  . CAD Mother 28       MI, aortic valve issues  . COPD Mother   . Lupus Mother   . Graves' disease Mother   . Rheum arthritis Mother   . CAD Father 45       CABG x2, aortic valve replacement  . Stroke Sister   . Alcohol abuse Brother   . CAD Brother 69       MI  . Stroke Brother   . Seizures Son   . COPD Brother   . CAD  Brother 22       stent  . Diabetes Brother   . Depression Grandchild   . Cancer Maternal Aunt        breast  . Breast cancer Maternal Aunt   . Diabetes Sister        deceased  . CAD Sister 57       stents  . Breast cancer Maternal Aunt     DRUG ALLERGIES:   Allergies  Allergen Reactions  . Iodinated Diagnostic Agents Other (See Comments)    Itching (severe) and chest tightness  . Penicillins Anaphylaxis, Swelling and Rash    Has patient had a PCN reaction causing immediate rash, facial/tongue/throat swelling, SOB or lightheadedness with hypotension: Yes Has patient had a PCN reaction causing severe rash involving mucus membranes or skin necrosis: Unknown Has patient had a PCN reaction that required hospitalization: Unknown Has patient had a PCN reaction occurring within the last 10 years: No If all of the above answers are "NO", then may proceed with  Cephalosporin use.   . Codeine Nausea Only  . Gabapentin     Gait abnormality  . Influenza Vaccines     Muscle weakness; unable to walk  . Nortriptyline     Eye swelling and mouth drawed up  . Pamelor [Nortriptyline Hcl]     Patient states caused her face to draw in together.  . Valsartan     Allergy to generic only Other reaction(s): Cough "Hacking" cough  . Erythromycin Rash and Swelling  . Sulfa Antibiotics Rash    REVIEW OF SYSTEMS:  Review of Systems  Constitutional: Negative for chills, fever and weight loss.  HENT: Negative for ear discharge, ear pain and nosebleeds.   Eyes: Negative for blurred vision, pain and discharge.  Respiratory: Negative for sputum production, shortness of breath, wheezing and stridor.   Cardiovascular: Positive for chest pain. Negative for palpitations, orthopnea and PND.  Gastrointestinal: Negative for abdominal pain, diarrhea, nausea and vomiting.  Genitourinary: Negative for frequency and urgency.  Musculoskeletal: Negative for back pain and joint pain.  Neurological: Positive for weakness. Negative for sensory change, speech change and focal weakness.  Psychiatric/Behavioral: Negative for depression and hallucinations. The patient is not nervous/anxious.      MEDICATIONS AT HOME:   Prior to Admission medications   Medication Sig Start Date End Date Taking? Authorizing Provider  aspirin EC 81 MG tablet Take 81 mg by mouth daily.   Yes [provider]  Cholecalciferol 50000 UNITS TABS Take 25,000 mg by mouth every 14 (fourteen) days. Take one tablet by mouth every 14 days (2 weeks)   Yes [provider]  ciprofloxacin (CILOXAN) 0.3 % ophthalmic solution Place 1 drop into the left eye every 4 (four) hours while awake. 03/03/17  Yes [name], MD  Cyanocobalamin (B-12 SL) Place 1 drop under the tongue daily.    Yes [provider]  ibuprofen (ADVIL,MOTRIN) 600 MG tablet Take 1 tablet (600 mg total) by mouth  every 8 (eight) hours as needed. 03/17/16  Yes Jearld Fenton, NP  insulin aspart (NOVOLOG) 100 UNIT/ML injection Use in an insulin pump upto 50 units per day 10/03/16  Yes Philemon Kingdom, MD  Javier Docker Oil 500 MG CAPS Take 2 capsules (1,000 mg total) by mouth daily. 09/20/17  Yes [name], MD  Lecithin 400 MG CAPS Take 1 capsule by mouth 3 (three) times a week.    Yes [provider]  lisinopril (PRINIVIL,ZESTRIL) 10 MG tablet Take 5 mg  by mouth daily.  05/25/17  Yes [name], MD  metFORMIN (GLUCOPHAGE-XR) 500 MG 24 hr tablet Take 1 tablet (500 mg total) by mouth 3 (three) times daily. 01/04/17  Yes Philemon Kingdom, MD  Multiple Vitamins-Minerals (PRESERVISION AREDS) TABS Take 2 tablets by mouth daily.   Yes [provider]  pantoprazole (PROTONIX) 40 MG tablet Take 1 tablet (40 mg total) by mouth daily. 12/19/16  Yes [name], MD  rosuvastatin (CRESTOR) 5 MG tablet Take 1 tablet (5 mg total) by mouth every other day. 08/21/17  Yes [name], MD  SYNTHROID 75 MCG tablet Take by mouth 75 mcg daily 1 tab 6/7 days and 1.5 tabs 1/7 days. Patient taking differently: Take 75 mcg by mouth daily before breakfast.  01/04/17  Yes Philemon Kingdom, MD  BD ULTRA-FINE LANCETS lancets Use as instructed upto 6 times daily 09/02/14   Phadke, Radhika P, MD  glucose blood (CONTOUR NEXT TEST) test strip Use as instructed to check 4 times daily 07/03/17   Philemon Kingdom, MD      VITAL SIGNS:  Blood pressure 124/88, pulse 62, temperature 97.6 F (36.4 C), resp. rate 17, height 5' 3.5" (1.613 m), weight 66.7 kg (147 lb), SpO2 100 %.  PHYSICAL EXAMINATION:  GENERAL:  77 y.o.-year-old patient lying in the bed with no acute distress.  EYES: Pupils equal, round, reactive to light and accommodation. No scleral icterus. Extraocular muscles intact.  HEENT: Head atraumatic, normocephalic. Oropharynx and nasopharynx clear.  NECK:  Supple, no jugular venous distention. No  thyroid enlargement, no tenderness.  LUNGS: Normal breath sounds bilaterally, no wheezing, rales,rhonchi or crepitation. No use of accessory muscles of respiration.  CARDIOVASCULAR: S1, S2 normal. No murmurs, rubs, or gallops.  ABDOMEN: Soft, nontender, nondistended. Bowel sounds present. No organomegaly or mass.  EXTREMITIES: No pedal edema, cyanosis, or clubbing.  NEUROLOGIC: Cranial nerves II through XII are intact. Muscle strength 5/5 in all extremities. Sensation intact. Gait not checked.  PSYCHIATRIC: The patient is alert and oriented x 3.  SKIN: No obvious rash, lesion, or ulcer.   LABORATORY PANEL:   CBC Recent Labs  Lab 10/01/17 1603  WBC 9.6  HGB 13.3  HCT 39.8  PLT 222   ------------------------------------------------------------------------------------------------------------------  Chemistries  Recent Labs  Lab 10/01/17 1603  NA 140  K 3.9  CL 106  CO2 23  GLUCOSE 231*  BUN 12  CREATININE 0.68  CALCIUM 9.5   ------------------------------------------------------------------------------------------------------------------  Cardiac Enzymes Recent Labs  Lab 10/01/17 1603  TROPONINI <0.03   ------------------------------------------------------------------------------------------------------------------  RADIOLOGY:  Dg Chest 2 View  Result Date: 10/01/2017 CLINICAL DATA:  Chest pressure this morning.  Was not feeling right. EXAM: CHEST  2 VIEW COMPARISON:  12/25/2016 FINDINGS: The heart size and mediastinal contours are within normal limits. Both lungs are clear. The visualized skeletal structures are unremarkable. IMPRESSION: No active cardiopulmonary disease. Electronically Signed   By: Kathreen Devoid   On: 10/01/2017 17:51    EKG:   Sinus rhythm.  No acute ST elevation or depression IMPRESSION AND PLAN:   Cassandra Benson  is a 77 y.o. female with a known history of carotid artery stenosis on the left, GERD, fibromyalgia, diabetes type 1 on insulin pump  comes to the emergency room after she started having chest tightness radiating to the back when she went to church.  Patient did not feel well all day decided to call the primary care physician's office who asked her to come to the emergency room.  1.  Chest pain  rule out ACS -Patient has risk factor with type 1 diabetes and hyperlipidemia along with strong family history of CAD -Admit to telemetry -Cycle cardiac enzymes x3 Aspirin as needed nitro continue cardiac meds- -Myoview stress test for tomorrow -Cardiology consultation -ER physician has ordered CT with IV contrast.  Patient has RN allergy.  Medication has been ordered.  This is to rule out aortic dissection.  2.  Type 1 diabetes on insulin pump -Patient will manage her own insulin pump -Sliding scale  3. hypoThyroidism  -continue Synthroid  4.  DVT prophylaxis subcu Lovenox  All the records are reviewed and case discussed with ED provider. Management plans discussed with the patient, family and they are in agreement.  CODE STATUS: Full  TOTAL TIME TAKING CARE OF THIS PATIENT: 50 minutes.    Fritzi Mandes M.D on 10/01/2017 at 8:34 PM  Between 7am to 6pm - Pager - [phone]  After 6pm go to [email or web address] - password [email or web address]  [facility]  Office  [phone]  CC: Primary care physician; [name], MD

## 2017-10-01 NOTE — ED Notes (Signed)
Attempted report - nurse busy. Called back to give report to charge, she is also busy. Lea notified.

## 2017-10-02 ENCOUNTER — Observation Stay (HOSPITAL_BASED_OUTPATIENT_CLINIC_OR_DEPARTMENT_OTHER): Payer: PPO

## 2017-10-02 ENCOUNTER — Telehealth: Payer: Self-pay

## 2017-10-02 ENCOUNTER — Encounter: Payer: Self-pay | Admitting: Radiology

## 2017-10-02 ENCOUNTER — Observation Stay: Payer: PPO

## 2017-10-02 ENCOUNTER — Observation Stay: Admit: 2017-10-02 | Payer: PPO

## 2017-10-02 ENCOUNTER — Observation Stay (HOSPITAL_BASED_OUTPATIENT_CLINIC_OR_DEPARTMENT_OTHER)
Admit: 2017-10-02 | Discharge: 2017-10-02 | Disposition: A | Discharge status: Home / Self Care | Payer: PPO | Attending: Internal Medicine | Admitting: Internal Medicine

## 2017-10-02 DIAGNOSIS — R079 Chest pain, unspecified: Secondary | ICD-10-CM | POA: Diagnosis not present

## 2017-10-02 DIAGNOSIS — E109 Type 1 diabetes mellitus without complications: Secondary | ICD-10-CM | POA: Diagnosis not present

## 2017-10-02 DIAGNOSIS — I259 Chronic ischemic heart disease, unspecified: Secondary | ICD-10-CM | POA: Diagnosis not present

## 2017-10-02 DIAGNOSIS — Z8679 Personal history of other diseases of the circulatory system: Secondary | ICD-10-CM | POA: Diagnosis not present

## 2017-10-02 DIAGNOSIS — E785 Hyperlipidemia, unspecified: Secondary | ICD-10-CM | POA: Diagnosis not present

## 2017-10-02 LAB — TROPONIN I: Troponin I: <0.03 ng/mL (ref <0.03)

## 2017-10-02 LAB — NM MYOCAR MULTI W/SPECT W/WALL MOTION / EF
CHL CUP NUCLEAR SRS: 0
CHL CUP NUCLEAR SSS: 2
LV dias vol: 32 mL (ref 46–106)
LV sys vol: 22 mL
NUC STRESS TID: 0.67
Peak HR: 112 {beats}/min
Percent HR: 77 %
Rest HR: 81 {beats}/min
SDS: 0

## 2017-10-02 LAB — ECHOCARDIOGRAM COMPLETE
HEIGHTINCHES: 63 in
WEIGHTICAEL: 2422.4 [oz_av]

## 2017-10-02 LAB — GLUCOSE, CAPILLARY
GLUCOSE-CAPILLARY: 197 mg/dL — AB (ref 65–99)
Glucose-Capillary: 167 mg/dL — ABNORMAL HIGH (ref 65–99)

## 2017-10-02 MED ORDER — INSULIN PUMP
Freq: Three times a day (TID) | SUBCUTANEOUS | Status: DC
  Administered 2017-10-02: 3.8 via SUBCUTANEOUS
  Filled 2017-10-02: qty 1

## 2017-10-02 MED ORDER — NITROGLYCERIN 0.4 MG SL SUBL: 0.4000 mg | SUBLINGUAL_TABLET | SUBLINGUAL | 0 refills | Status: DC | PRN

## 2017-10-02 MED ORDER — REGADENOSON 0.4 MG/5ML IV SOLN
0.4000 mg | Freq: Once | INTRAVENOUS | Status: AC
  Administered 2017-10-02: 0.4 mg via INTRAVENOUS

## 2017-10-02 MED ORDER — LEVOTHYROXINE SODIUM 75 MCG PO TABS: 75.0000 ug | ORAL_TABLET | Freq: Every day | ORAL | 0 refills | Status: DC

## 2017-10-02 MED ORDER — IBUPROFEN 400 MG PO TABS
400.0000 mg | ORAL_TABLET | ORAL | Status: AC
  Administered 2017-10-02: 400 mg via ORAL
  Filled 2017-10-02: qty 1

## 2017-10-02 MED ORDER — TECHNETIUM TC 99M TETROFOSMIN IV KIT
13.2300 | PACK | Freq: Once | INTRAVENOUS | Status: AC | PRN
  Administered 2017-10-02: 13.23 via INTRAVENOUS

## 2017-10-02 MED ORDER — IOPAMIDOL (ISOVUE-370) INJECTION 76%
75.0000 mL | Freq: Once | INTRAVENOUS | Status: AC | PRN
  Administered 2017-10-02: 75 mL via INTRAVENOUS

## 2017-10-02 MED ORDER — TECHNETIUM TC 99M TETROFOSMIN IV KIT
33.1000 | PACK | Freq: Once | INTRAVENOUS | Status: AC | PRN
  Administered 2017-10-02: 33.1 via INTRAVENOUS

## 2017-10-02 MED ORDER — BUTALBITAL-APAP-CAFFEINE 50-325-40 MG PO TABS
1.0000 | ORAL_TABLET | Freq: Once | ORAL | Status: AC
  Administered 2017-10-02: 1 via ORAL
  Filled 2017-10-02: qty 1

## 2017-10-02 NOTE — Telephone Encounter (Signed)
PLEASE NOTE: All timestamps contained within this report are represented as [place] Standard Time. CONFIDENTIALTY NOTICE: This fax transmission is intended only for the addressee. It contains information that is legally privileged, confidential or otherwise protected from use or disclosure. If you are not the intended recipient, you are strictly prohibited from reviewing, disclosing, copying using or disseminating any of this information or taking any action in reliance on or regarding this information. If you have received this fax in error, please notify us immediately by telephone so that we can arrange for its return to [place]. Phone: [phone], Toll-Free: [phone], Fax: [phone] Page: 1 of 2 Call Id: [id number] [place] Patient Name: Cassandra Benson Gender: Female DOB: April 09, 1941 Age: 77 Y 3 M 4 D Return Phone Number: [phone] (Primary), [phone] (Secondary) Address[place] [address] Client [facility] Night - Client Client Site [place] Physician [name] - MD Contact Type Call Who Is Calling Patient / Member / Family / Caregiver Call Type Triage / Clinical Relationship To Patient Self Return Phone Number [phone] (Secondary) Chief Complaint CHEST PAIN (>=21 years) - pain, pressure, heaviness or tightness Reason for Call Symptomatic / Request for [place] states had a really hard compression feeling in her chest; then back pain; and feels horrible since; elbows hurt; BP higher than normal 133/69; weak and dizzy; Translation No Nurse Assessment Nurse: Julien Girt, RN, Almyra Free Date/Time Eilene Ghazi Time): 10/01/2017 2:36:15 PM Confirm and document reason for call. If symptomatic, describe symptoms. ---Caller states had a really hard "compressed feeling" in  her chest, then back pain at 8:45 am and she has felt horrible ever since. Her left elbows hurts, her BP is higher than normal at 133/69. She has tingling in her left jaw and she feels weak and dizzy. Does the patient have any new or worsening symptoms? ---Yes Will a triage be completed? ---Yes Related visit to physician within the last 2 weeks? ---No Does the PT have any chronic conditions? (i.e. diabetes, asthma, etc.) ---Yes List chronic conditions. 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 ---No Guidelines Guideline Title Affirmed Question Affirmed Notes Nurse Date/Time (Eastern Time) Chest Pain [1] Chest pain lasts > 5 minutes AND [2] history of heart disease (i.e., heart attack, bypass surgery, angina, angioplasty, CHF; not just a heart murmur) Chancy Hurter 10/01/2017 2:41:16 PM PLEASE NOTE: All timestamps contained within this report are represented as [place] Standard Time. CONFIDENTIALTY NOTICE: This fax transmission is intended only for the addressee. It contains information that is legally privileged, confidential or otherwise protected from use or disclosure. If you are not the intended recipient, you are strictly prohibited from reviewing, disclosing, copying using or disseminating any of this information or taking any action in reliance on or regarding this information. If you have received this fax in error, please notify us immediately by telephone so that we can arrange for its return to [place]. Phone: [phone], Toll-Free: [phone], Fax: [phone] Page: 2 of 2 Call Id: [id number] [place]. Time Eilene Ghazi Time) Disposition Final User 10/01/2017 2:32:57 PM Send to Urgent Mikael Spray 10/01/2017 2:50:46 PM Called On-Call Provider Julien Girt, RN, Almyra Free Reason: Caller states she has called 911, her grandson is present . 10/01/2017 2:43:39 PM Call EMS 911 Now Yes Julien Girt, RN,  Dagoberto Reef Disagree/Comply Comply Caller Understands Yes  PreDisposition Did not know what to do Care Advice Given Per Guideline CALL EMS 911 NOW: Immediate medical attention is needed. You need to hang up and call 911 (or an ambulance). Psychologist, forensic Discretion: I'll call you back in a few minutes to be sure you were able to reach them.) CARE ADVICE given per Chest Pain (Adult) guideline.

## 2017-10-02 NOTE — Discharge Summary (Signed)
[facility] [place] NAME: Cassandra Benson    MR#:  [id number]  DATE OF BIRTH:  December 05, 1940  DATE OF ADMISSION:  10/01/2017 ADMITTING PHYSICIAN: Fritzi Mandes, MD  DATE OF DISCHARGE: No discharge date for patient encounter.  PRIMARY CARE PHYSICIAN: [name], MD    ADMISSION DIAGNOSIS:  Other chest pain [R07.89]  DISCHARGE DIAGNOSIS:  Active Problems:   Chest pain   SECONDARY DIAGNOSIS:   Past Medical History:  Diagnosis Date  . Allergy   . Arthritis   . Carotid stenosis, asymptomatic 06/19/2015   6-01% RICA 09-32% LICA rpt 1 yr (11/5571)   . Diabetes mellitus without complication ([place])   . Fibromyalgia    prior PCP  . GERD (gastroesophageal reflux disease)    prior PCP  . Glaucoma    Narrow angle  . History of blood clots    DVT, in 20s, none since  . History of chicken pox   . History of diverticulitis   . History of pericarditis 1986   with hospitalization  . History of pneumonia 2014  . History of shingles   . History of UTI   . Hyperlipidemia   . Hypothyroidism   . Lupus (systemic lupus erythematosus) ([place])   . Shoulder pain left   h/o RTC tendonitis and adhesive capsulitis  . Sleep apnea    prior PCP  . Vitamin D deficiency    prior PCP    HOSPITAL COURSE:   Cassandra Benson  is a 77 y.o. female with a known history of carotid artery stenosis on the left, GERD, fibromyalgia, diabetes type 1 on insulin pump comes to the emergency room after she started having chest tightness radiating to the back when she went to church.  Patient did not feel well all day decided to call the primary care physician's office who asked her to come to the emergency room.  1.  Chest pain rule out ACS -Patient has risk factor with type 1 diabetes and hyperlipidemia along with strong family history of CAD - Monitored on telemetry -Cycle cardiac enzymes x3- negative. Aspirin as needed nitro continue cardiac meds- -Myoview  stress test done and it was low risk study. -ER physician has ordered CT with IV contrast- to rule out aortic dissection and that was negative. - Patient has appointment with Dr. Rockey Situ tomorrow, so advised to follow tomorrow in the office as stress test is negative.  2.  Type 1 diabetes on insulin pump -Patient will manage her own insulin pump -Sliding scale  3. hypoThyroidism  -continue Synthroid  4.  DVT prophylaxis subcu Lovenox    DISCHARGE CONDITIONS:  Stable.  CONSULTS OBTAINED:  Treatment Team:  Wellington Hampshire, MD Minna Merritts, MD  DRUG ALLERGIES:   Allergies  Allergen Reactions  . Iodinated Diagnostic Agents Other (See Comments)    Itching (severe) and chest tightness  . Penicillins Anaphylaxis, Swelling and Rash    Has patient had a PCN reaction causing immediate rash, facial/tongue/throat swelling, SOB or lightheadedness with hypotension: Yes Has patient had a PCN reaction causing severe rash involving mucus membranes or skin necrosis: Unknown Has patient had a PCN reaction that required hospitalization: Unknown Has patient had a PCN reaction occurring within the last 10 years: No If all of the above answers are "NO", then may proceed with Cephalosporin use.   . Codeine Nausea Only  . Gabapentin     Gait abnormality  . Influenza Vaccines  Muscle weakness; unable to walk  . Nortriptyline     Eye swelling and mouth drawed up  . Pamelor [Nortriptyline Hcl]     Patient states caused her face to draw in together.  . Valsartan     Allergy to generic only Other reaction(s): Cough "Hacking" cough  . Erythromycin Rash and Swelling  . Sulfa Antibiotics Rash    DISCHARGE MEDICATIONS:   Allergies as of 10/02/2017      Reactions   Iodinated Diagnostic Agents Other (See Comments)   Itching (severe) and chest tightness   Penicillins Anaphylaxis, Swelling, Rash   Has patient had a PCN reaction causing immediate rash, facial/tongue/throat swelling,  SOB or lightheadedness with hypotension: Yes Has patient had a PCN reaction causing severe rash involving mucus membranes or skin necrosis: Unknown Has patient had a PCN reaction that required hospitalization: Unknown Has patient had a PCN reaction occurring within the last 10 years: No If all of the above answers are "NO", then may proceed with Cephalosporin use.   Codeine Nausea Only   Gabapentin    Gait abnormality   Influenza Vaccines    Muscle weakness; unable to walk   Nortriptyline    Eye swelling and mouth drawed up   Pamelor [nortriptyline Hcl]    Patient states caused her face to draw in together.   Valsartan    Allergy to generic only Other reaction(s): Cough "Hacking" cough   Erythromycin Rash, Swelling   Sulfa Antibiotics Rash      Medication List    STOP taking these medications   metFORMIN 500 MG 24 hr tablet Commonly known as:  GLUCOPHAGE-XR     TAKE these medications   aspirin EC 81 MG tablet Take 81 mg by mouth daily.   B-12 SL Place 1 drop under the tongue daily.   BD ULTRA-FINE LANCETS lancets Use as instructed upto 6 times daily   Cholecalciferol 50000 units Tabs Take 25,000 mg by mouth every 14 (fourteen) days. Take one tablet by mouth every 14 days (2 weeks)   ciprofloxacin 0.3 % ophthalmic solution Commonly known as:  CILOXAN Place 1 drop into the left eye every 4 (four) hours while awake.   glucose blood test strip Commonly known as:  CONTOUR NEXT TEST Use as instructed to check 4 times daily   ibuprofen 600 MG tablet Commonly known as:  ADVIL,MOTRIN Take 1 tablet (600 mg total) by mouth every 8 (eight) hours as needed.   insulin aspart 100 UNIT/ML injection Commonly known as:  novoLOG Use in an insulin pump upto 50 units per day   Krill Oil 500 MG Caps Take 2 capsules (1,000 mg total) by mouth daily.   Lecithin 400 MG Caps Take 1 capsule by mouth 3 (three) times a week.   levothyroxine 75 MCG tablet Commonly known as:   SYNTHROID Take 1 tablet (75 mcg total) by mouth daily before breakfast. Start taking on:  10/03/2017   lisinopril 10 MG tablet Commonly known as:  PRINIVIL,ZESTRIL Take 5 mg by mouth daily.   nitroGLYCERIN 0.4 MG SL tablet Commonly known as:  NITROSTAT Place 1 tablet (0.4 mg total) under the tongue every 5 (five) minutes as needed for chest pain.   pantoprazole 40 MG tablet Commonly known as:  PROTONIX Take 1 tablet (40 mg total) by mouth daily.   PRESERVISION AREDS Tabs Take 2 tablets by mouth daily.   rosuvastatin 5 MG tablet Commonly known as:  CRESTOR Take 1 tablet (5 mg total) by mouth every  other day.        DISCHARGE INSTRUCTIONS:    Follow with cardiology clinic tomorrow.  If you experience worsening of your admission symptoms, develop shortness of breath, life threatening emergency, suicidal or homicidal thoughts you must seek medical attention immediately by calling 911 or calling your MD immediately  if symptoms less severe.  You Must read complete instructions/literature along with all the possible adverse reactions/side effects for all the Medicines you take and that have been prescribed to you. Take any new Medicines after you have completely understood and accept all the possible adverse reactions/side effects.   Please note  You were cared for by a hospitalist during your hospital stay. If you have any questions about your discharge medications or the care you received while you were in the hospital after you are discharged, you can call the unit and asked to speak with the hospitalist on call if the hospitalist that took care of you is not available. Once you are discharged, your primary care physician will handle any further medical issues. Please note that NO REFILLS for any discharge medications will be authorized once you are discharged, as it is imperative that you return to your primary care physician (or establish a relationship with a primary care  physician if you do not have one) for your aftercare needs so that they can reassess your need for medications and monitor your lab values.    Today   CHIEF COMPLAINT:   Chief Complaint  Patient presents with  . Chest Pain    HISTORY OF PRESENT ILLNESS:  Cassandra Benson  is a 77 y.o. female with a known history of carotid artery stenosis on the left, GERD, fibromyalgia, diabetes type 1 on insulin pump comes to the emergency room after she started having chest tightness radiating to the back when she went to church.  Patient did not feel well all day decided to call the primary care physician's office who asked her to come to the emergency room. She received IV morphine and since then has been feeling better.  This denies any radiation of pain shortness of breath nausea or vomiting Patient is being admitted for chest pain rule out.  Her first set of cardiac enzyme is negative Not have any known coronary artery disease however has a very strong family history of coronary artery disease.   VITAL SIGNS:  Blood pressure (!) 119/54, pulse 61, temperature 97.7 F (36.5 C), temperature source Oral, resp. rate 14, height 5\' 3"  (1.6 m), weight 68.7 kg (151 lb 6.4 oz), SpO2 100 %.  I/O:    Intake/Output Summary (Last 24 hours) at 10/02/2017 1407 Last data filed at 10/02/2017 0015 Gross per 24 hour  Intake -  Output 250 ml  Net -250 ml    PHYSICAL EXAMINATION:  GENERAL:  77 y.o.-year-old patient lying in the bed with no acute distress.  EYES: Pupils equal, round, reactive to light and accommodation. No scleral icterus. Extraocular muscles intact.  HEENT: Head atraumatic, normocephalic. Oropharynx and nasopharynx clear.  NECK:  Supple, no jugular venous distention. No thyroid enlargement, no tenderness.  LUNGS: Normal breath sounds bilaterally, no wheezing, rales,rhonchi or crepitation. No use of accessory muscles of respiration.  CARDIOVASCULAR: S1, S2 normal. No murmurs, rubs, or gallops.   ABDOMEN: Soft, non-tender, non-distended. Bowel sounds present. No organomegaly or mass.  EXTREMITIES: No pedal edema, cyanosis, or clubbing.  NEUROLOGIC: Cranial nerves II through XII are intact. Muscle strength 5/5 in all extremities. Sensation intact. Gait not checked.  PSYCHIATRIC: The patient is alert and oriented x 3.  SKIN: No obvious rash, lesion, or ulcer.   DATA REVIEW:   CBC Recent Labs  Lab 10/01/17 1603  WBC 9.6  HGB 13.3  HCT 39.8  PLT 222    Chemistries  Recent Labs  Lab 10/01/17 1603  NA 140  K 3.9  CL 106  CO2 23  GLUCOSE 231*  BUN 12  CREATININE 0.68  CALCIUM 9.5    Cardiac Enzymes Recent Labs  Lab 10/02/17 0559  TROPONINI <0.03    Microbiology Results  Results for orders placed or performed in visit on 10/31/16  WET PREP BY MOLECULAR PROBE     Status: None   Collection Time: 10/31/16  4:11 PM  Result Value Ref Range Status   Candida species NEG Negative Final   Trichomonas vaginosis NEG Negative Final   Gardnerella vaginalis NEG Negative Final  Urine culture     Status: None   Collection Time: 10/31/16  4:11 PM  Result Value Ref Range Status   Organism ID, Bacteria NO GROWTH  Final    RADIOLOGY:  Dg Chest 2 View  Result Date: 10/01/2017 CLINICAL DATA:  Chest pressure this morning.  Was not feeling right. EXAM: CHEST  2 VIEW COMPARISON:  12/25/2016 FINDINGS: The heart size and mediastinal contours are within normal limits. Both lungs are clear. The visualized skeletal structures are unremarkable. IMPRESSION: No active cardiopulmonary disease. Electronically Signed   By: Kathreen Devoid   On: 10/01/2017 17:51   Nm Myocar Multi W/spect W/wall Motion / Ef  Result Date: 10/02/2017  There was no ST segment deviation noted during stress.  No T wave inversion was noted during stress.  The study is normal.  This is a low risk study.  The left ventricular ejection fraction is hyperdynamic (>65%).    Ct Angio Chest Aorta W And/or Wo  Contrast  Result Date: 10/02/2017 CLINICAL DATA:  Chest and back pain.  Aortic dissection suspected. EXAM: CT ANGIOGRAPHY CHEST WITH CONTRAST TECHNIQUE: Multidetector CT imaging of the chest was performed using the standard protocol during bolus administration of intravenous contrast. Multiplanar CT image reconstructions and MIPs were obtained to evaluate the vascular anatomy. CONTRAST:  25mL ISOVUE-370 IOPAMIDOL (ISOVUE-370) INJECTION 76% Patient was pre-medicated due to IV contrast allergy. No contrast reactions noted after premedication. COMPARISON:  Chest radiograph yesterday.  Chest CT 05/26/2015 FINDINGS: Cardiovascular: No aortic dissection, aneurysm, acute aortic syndrome or hematoma. No significant atherosclerosis per Mild cardiac motion artifact through the ascending aorta. Common origin of the brachiocephalic and left common carotid artery, normal variant arch anatomy. No filling defects in the central most pulmonary arteries to suggest pulmonary embolism. Small amount of fluid in the superior pericardial recess. Minimal focal pericardial fluid/ thickening adjacent to the right ventricle. Mediastinum/Nodes: No mediastinal or hilar adenopathy. The esophagus is decompressed. No thyroid nodule. Lungs/Pleura: Mild hypoventilatory and linear lung base atelectasis. No consolidation. No pulmonary edema. No pleural fluid. No pulmonary mass. The trachea and mainstem bronchi are patent. Upper Abdomen: Mild hepatic steatosis.  No acute abnormality. Musculoskeletal: There are no acute or suspicious osseous abnormalities. Review of the MIP images confirms the above findings. IMPRESSION: 1. No aortic dissection or acute aortic abnormality. 2. No acute chest finding or explanation for chest and back pain. Electronically Signed   By: Jeb Levering M.D.   On: 10/02/2017 01:30    EKG:   Orders placed or performed during the hospital encounter of 10/01/17  . EKG 12-Lead  .  EKG 12-Lead      Management plans  discussed with the patient, family and they are in agreement.  CODE STATUS:     Code Status Orders  (From admission, onward)        Start     Ordered   10/01/17 2233  Full code  Continuous     10/01/17 2233    Code Status History    Date Active Date Inactive Code Status Order ID Comments User Context   05/26/2015 08:30 05/27/2015 14:55 Full Code [id number]  Harrie Foreman, MD Inpatient    Advance Directive Documentation     Most Recent Value  Type of Advance Directive  Living will  Pre-existing out of facility DNR order (yellow form or pink MOST form)  No data  "MOST" Form in Place?  No data      TOTAL TIME TAKING CARE OF THIS PATIENT: 35 minutes.    Vaughan Basta M.D on 10/02/2017 at 2:07 PM  Between 7am to 6pm - Pager - [phone]  After 6pm go to [email or web address] - password [email or web address] [facility]  Office  [phone]  CC: Primary care physician; [name], MD   Note: This dictation was prepared with Dragon dictation along with smaller phrase technology. Any transcriptional errors that result from this process are unintentional.

## 2017-10-02 NOTE — Progress Notes (Signed)
*  PRELIMINARY RESULTS* Echocardiogram 2D Echocardiogram has been performed.  Sherrie Sport 10/02/2017, 12:47 PM

## 2017-10-02 NOTE — Progress Notes (Signed)
Admit with: CP  History: Type 1 DM ([facility])- Endocrinologist Dr. Philemon Kingdom with [facility] Meds: Insulin Pump (see below for settings)                             Metformin ER 500 mg TID  Current Insulin Orders: Insulin Pump                                       Metformin 500 mg TID      Met with pt today.  A&O X 4 and able to independently manage insulin pump.  Has access to more insulin pump supplies if needed.  Changed set/site 10/01/17.  Reviewed Insulin pump settings with patient. They are as follows:  --Insulin Pump Settings--  Basal Rates: 12am- 0.8 units/hr 3am- 0.875 units/hr 9:30am- 0.1 units/hr 5pm- 1.5 units/hr 6:30pm- 1.3 units/hr 11:30pm- 1.8 units/hr  Total Basal Insulin per 24 hours period= 18.5 units  Carbohydrate Ratio:  12am- 1 unit for every 10 Grams of Carbohydrates 6pm- 1 unit for every 8 Grams of Carbohydrates  Correction/Sensitivity Factor: 1 unit for every 35 mg/dl above Target CBG  Target CBG: 100-130 mg/dl      --Will follow patient during hospitalization--  Wyn Quaker RN, MSN, CDE Diabetes Coordinator Inpatient Glycemic Control Team Team Pager: [phone] (8a-5p)

## 2017-10-02 NOTE — Progress Notes (Signed)
Inpatient Diabetes Program Recommendations  AACE/ADA: New Consensus Statement on Inpatient Glycemic Control (2015)  Target Ranges:  Prepandial:   less than 140 mg/dL      Peak postprandial:   less than 180 mg/dL (1-2 hours)      Critically ill patients:  140 - 180 mg/dL    Results for ELDENA, DEDE (MRN [id number]) as of 10/02/2017 08:30  Ref. Range 10/01/2017 23:11 10/02/2017 07:47  Glucose-Capillary Latest Ref Range: 65 - 99 mg/dL 307 (H) 167 (H)    Admit with: CP  History: Type 1 DM (LADA)- Endocrinologist Dr. Philemon Kingdom with [facility] Meds: Insulin Pump (see below for settings)       Metformin ER 500 mg TID  Current Insulin Orders: Novolog Sensitive Correction Scale/ SSI (0-9 units) TID AC      Metformin 500 mg TID       MD- Please place Insulin Pump Order set to allow patient to use her insulin pump in hospital.  Can d/c orders for Novolog Sensitive SSI  Plan to see pt today to review her insulin pump settings and make sure she has extra insulin pump supplies (or has access to more supplies).      --Will follow patient during hospitalization--  Wyn Quaker RN, MSN, CDE Diabetes Coordinator Inpatient Glycemic Control Team Team Pager: [phone] (8a-5p)

## 2017-10-02 NOTE — Care Management Note (Signed)
Case Management Note  Patient Details  Name: ARDEN AXON MRN: [id number] Date of Birth: June 20, 1941  Subjective/Objective:                 Placed in observation for chest pain.  Troponins negative. Cardiology consult pending.  Patient has chronic insulin pump   Action/Plan:   Expected Discharge Date:  10/03/17               Expected Discharge Plan:     In-House Referral:     Discharge planning Services     Post Acute Care Choice:    Choice offered to:     DME Arranged:    DME Agency:     HH Arranged:    HH Agency:     Status of Service:     If discussed at [employer], dates discussed:    Additional Comments:  Katrina Stack, RN 10/02/2017, 8:41 AM

## 2017-10-02 NOTE — Telephone Encounter (Signed)
Per chart review tab pt admitted to [facility] on 10/01/17.

## 2017-10-02 NOTE — Progress Notes (Signed)
Patient is admitted to room 257 with the diagnosis of chest pain. Alert and oriented x 4.  Admission profile completed. patient is oriented to her room, ascom and call bell. The patient received Benadryl 50 mg Iv for CT with contrast she is now at [place]. She received Morphine 1 mg IV for c/o headache which was effective. Will continue to monitor.

## 2017-10-02 NOTE — Care Management Obs Status (Signed)
[facility] NOTIFICATION   Patient Details  Name: SAMREET EDENFIELD MRN: [id number] Date of Birth: 27-Aug-1941   Medicare Observation Status Notification Given:  Yes Patient is not able to maneuver mouse to sign notice Notice signed, one given to patient and the other to HIM for scanning   Katrina Stack, RN 10/02/2017, 11:46 AM

## 2017-10-03 ENCOUNTER — Ambulatory Visit: Payer: PPO | Admitting: Cardiovascular Disease

## 2017-10-03 ENCOUNTER — Encounter: Payer: Self-pay | Admitting: Cardiovascular Disease

## 2017-10-03 VITALS — BP 146/74 | HR 90 | Ht 63.0 in | Wt 153.0 lb

## 2017-10-03 DIAGNOSIS — R0789 Other chest pain: Secondary | ICD-10-CM

## 2017-10-03 DIAGNOSIS — M329 Systemic lupus erythematosus, unspecified: Secondary | ICD-10-CM | POA: Diagnosis not present

## 2017-10-03 DIAGNOSIS — I10 Essential (primary) hypertension: Secondary | ICD-10-CM | POA: Diagnosis not present

## 2017-10-03 DIAGNOSIS — I6523 Occlusion and stenosis of bilateral carotid arteries: Secondary | ICD-10-CM | POA: Diagnosis not present

## 2017-10-03 DIAGNOSIS — E782 Mixed hyperlipidemia: Secondary | ICD-10-CM | POA: Diagnosis not present

## 2017-10-03 NOTE — Patient Instructions (Signed)
Medication Instructions:   No medication changes made  Labwork:  No new labs needed  Testing/Procedures:  No further testing at this time   Follow-Up: It was a pleasure seeing you in the office today. Please call us if you have new issues that need to be addressed before your next appt.  [phone]  Your physician wants you to follow-up in: 12 months as needed You will receive a reminder letter in the mail two months in advance. If you don't receive a letter, please call our office to schedule the follow-up appointment.  If you need a refill on your cardiac medications before your next appointment, please call your pharmacy.     

## 2017-10-11 ENCOUNTER — Ambulatory Visit (INDEPENDENT_AMBULATORY_CARE_PROVIDER_SITE_OTHER): Payer: PPO | Admitting: Family Medicine

## 2017-10-11 ENCOUNTER — Encounter: Payer: Self-pay | Admitting: Family Medicine

## 2017-10-11 VITALS — BP 118/60 | HR 90 | Temp 97.9°F | Wt 152.0 lb

## 2017-10-11 DIAGNOSIS — E039 Hypothyroidism, unspecified: Secondary | ICD-10-CM | POA: Diagnosis not present

## 2017-10-11 DIAGNOSIS — E782 Mixed hyperlipidemia: Secondary | ICD-10-CM

## 2017-10-11 DIAGNOSIS — R0789 Other chest pain: Secondary | ICD-10-CM | POA: Diagnosis not present

## 2017-10-11 DIAGNOSIS — M329 Systemic lupus erythematosus, unspecified: Secondary | ICD-10-CM | POA: Diagnosis not present

## 2017-10-11 NOTE — Assessment & Plan Note (Signed)
Working towards repatha/praluent coverage. Pt motivated to start shot.

## 2017-10-11 NOTE — Assessment & Plan Note (Signed)
Update TFTs on lower thyroid dose (brand synthroid 65mcg daily).

## 2017-10-11 NOTE — Progress Notes (Signed)
BP 118/60 (BP Location: Left Arm, Patient Position: Sitting, Cuff Size: Normal)   Pulse 90   Temp 97.9 F (36.6 C) (Oral)   Wt 152 lb (68.9 kg)   SpO2 98%   BMI 26.93 kg/m    CC: chest pain hosp f/u  Subjective:    Patient ID: Cassandra Benson, female    DOB: 05/30/1941, 77 y.o.   MRN: [id number]  HPI: OTHA Benson is a 77 y.o. female presenting on 10/11/2017 for Hospitalization Follow-up (Admitted to [facility] 10/01/17 due to chest pain)   Hospital records reviewed. Recent hospitalization for chest pain described as tightness radiating to the back that started while at church, r/o'd ACS. Low risk myoview stress test. CT with IV contrast ruled out aortic dissection. Saw cardiology in f/u last week, note reviewed. Unclear cause of chest pain but reassuring workup. ?GI spasm - rec trial nitro for this, tums, pepcid, coca cola for gas. Planning on starting repatha/praluent. ($40 copay)  "Worst pain in my life". "felt like cinderblock on my chest". Sudden onset, sudden off.   No GERD, heartburn, dysphagia. No new rashes. No fevers.  Ongoing low energy. Ongoing body aches.   TCM f/u phone call not performed. DATE OF ADMISSION:  10/01/2017     DATE OF DISCHARGE: No discharge date for patient encounter  Discharge Dx: Chest pain  Relevant past medical, surgical, family and social history reviewed and updated as indicated. Interim medical history since our last visit reviewed. Allergies and medications reviewed and updated. Outpatient Medications Prior to Visit  Medication Sig Dispense Refill  . aspirin EC 81 MG tablet Take 81 mg by mouth daily.    . BD ULTRA-FINE LANCETS lancets Use as instructed upto 6 times daily 600 each 2  . Cholecalciferol 50000 UNITS TABS Take 25,000 mg by mouth every 14 (fourteen) days. Take one tablet by mouth every 14 days (2 weeks)    . ciprofloxacin (CILOXAN) 0.3 % ophthalmic solution Place 1 drop into the left eye every 4 (four) hours while awake. 5 mL 0  .  Cyanocobalamin (B-12 SL) Place 1 drop under the tongue daily.     Marland Kitchen glucose blood (CONTOUR NEXT TEST) test strip Use as instructed to check 4 times daily 400 each 5  . ibuprofen (ADVIL,MOTRIN) 600 MG tablet Take 1 tablet (600 mg total) by mouth every 8 (eight) hours as needed. 30 tablet 0  . insulin aspart (NOVOLOG) 100 UNIT/ML injection Use in an insulin pump upto 50 units per day 2 vial 6  . Krill Oil 500 MG CAPS Take 2 capsules (1,000 mg total) by mouth daily.    . Lecithin 400 MG CAPS Take 1 capsule by mouth 3 (three) times a week.     . levothyroxine (SYNTHROID) 75 MCG tablet Take 1 tablet (75 mcg total) by mouth daily before breakfast. 30 tablet 0  . lisinopril (PRINIVIL,ZESTRIL) 10 MG tablet Take 5 mg by mouth daily.  30 tablet 6  . Multiple Vitamins-Minerals (PRESERVISION AREDS) TABS Take 2 tablets by mouth daily.    . nitroGLYCERIN (NITROSTAT) 0.4 MG SL tablet Place 1 tablet (0.4 mg total) under the tongue every 5 (five) minutes as needed for chest pain. 30 tablet 0  . pantoprazole (PROTONIX) 40 MG tablet Take 1 tablet (40 mg total) by mouth daily. 30 tablet 1  . rosuvastatin (CRESTOR) 5 MG tablet Take 1 tablet (5 mg total) by mouth every other day. 30 tablet 6   No facility-administered medications prior to  visit.      Per HPI unless specifically indicated in ROS section below Review of Systems     Objective:    BP 118/60 (BP Location: Left Arm, Patient Position: Sitting, Cuff Size: Normal)   Pulse 90   Temp 97.9 F (36.6 C) (Oral)   Wt 152 lb (68.9 kg)   SpO2 98%   BMI 26.93 kg/m   Wt Readings from Last 3 Encounters:  10/11/17 152 lb (68.9 kg)  10/03/17 153 lb (69.4 kg)  10/01/17 151 lb 6.4 oz (68.7 kg)    Physical Exam  Constitutional: She appears well-developed and well-nourished. No distress.  HENT:  Mouth/Throat: Oropharynx is clear and moist. No oropharyngeal exudate.  Cardiovascular: Normal rate, regular rhythm, normal heart sounds and intact distal pulses.    No murmur heard. Pulmonary/Chest: Effort normal and breath sounds normal. No respiratory distress. She has no wheezes. She has no rales.  Abdominal: Soft. Normal appearance and bowel sounds are normal. She exhibits no distension and no mass. There is no hepatosplenomegaly. There is generalized tenderness (mild). There is no rigidity, no rebound, no guarding, no CVA tenderness and negative Murphy's sign.  Musculoskeletal: She exhibits no edema.  Skin: Skin is warm and dry. No rash noted.  Psychiatric: She has a normal mood and affect.  Nursing note and vitals reviewed.  Results for orders placed or performed during the hospital encounter of 10/01/17  NM Myocar Multi W/Spect W/Wall Motion / EF  Result Value Ref Range   Rest HR 81 bpm   Rest BP 128/63 mmHg   Percent HR 77 %   Peak HR 112 bpm   Peak BP 164/59 mmHg   SSS 2    SRS 0    SDS 0    TID 0.67    LV sys vol 22 mL   LV dias vol 32 46 - 106 mL  CBC with Differential  Result Value Ref Range   WBC 9.6 3.6 - 11.0 K/uL   RBC 4.96 3.80 - 5.20 MIL/uL   Hemoglobin 13.3 12.0 - 16.0 g/dL   HCT 39.8 35.0 - 47.0 %   MCV 80.3 80.0 - 100.0 fL   MCH 26.9 26.0 - 34.0 pg   MCHC 33.5 32.0 - 36.0 g/dL   RDW 14.5 11.5 - 14.5 %   Platelets 222 150 - 440 K/uL   Neutrophils Relative % 70 %   Neutro Abs 6.7 (H) 1.4 - 6.5 K/uL   Lymphocytes Relative 22 %   Lymphs Abs 2.1 1.0 - 3.6 K/uL   Monocytes Relative 6 %   Monocytes Absolute 0.6 0.2 - 0.9 K/uL   Eosinophils Relative 1 %   Eosinophils Absolute 0.1 0 - 0.7 K/uL   Basophils Relative 1 %   Basophils Absolute 0.1 0 - 0.1 K/uL  Basic metabolic panel  Result Value Ref Range   Sodium 140 135 - 145 mmol/L   Potassium 3.9 3.5 - 5.1 mmol/L   Chloride 106 101 - 111 mmol/L   CO2 23 22 - 32 mmol/L   Glucose, Bld 231 (H) 65 - 99 mg/dL   BUN 12 6 - 20 mg/dL   Creatinine, Ser 0.68 0.44 - 1.00 mg/dL   Calcium 9.5 8.9 - 10.3 mg/dL   GFR calc non Af Amer >60 >60 mL/min   GFR calc Af Amer >60 >60  mL/min   Anion gap 11 5 - 15  Troponin I  Result Value Ref Range   Troponin I <0.03 <0.03 ng/mL  Fibrin derivatives D-Dimer ([facility] only)  Result Value Ref Range   Fibrin derivatives D-dimer ([facility]) 1,074.06 (H) 0.00 - 499.00 ng/mL (FEU)  Glucose, capillary  Result Value Ref Range   Glucose-Capillary 307 (H) 65 - 99 mg/dL   Comment 1 Notify RN    Comment 2 Document in Chart   Troponin I  Result Value Ref Range   Troponin I <0.03 <0.03 ng/mL  Troponin I  Result Value Ref Range   Troponin I <0.03 <0.03 ng/mL  Glucose, capillary  Result Value Ref Range   Glucose-Capillary 167 (H) 65 - 99 mg/dL  Glucose, capillary  Result Value Ref Range   Glucose-Capillary 197 (H) 65 - 99 mg/dL  ECHOCARDIOGRAM COMPLETE  Result Value Ref Range   Weight 2,422.4 oz   Height 63 in   BP 119/54 mmHg   Lab Results  Component Value Date   TSH 0.24 (L) 08/11/2017    Lab Results  Component Value Date   HGBA1C 7.3 06/29/2017       Assessment & Plan:   Problem List Items Addressed This Visit    Chest pain - Primary    Reassuring cardiac evaluation recently. She doesn't really have GERD symptoms to suggest GI related. Reproducible MSK pain - ?SLE related. Check ESR, ANA today. D dimer was markedly elevated but she had normal CTA. No LE symptoms.       Relevant Orders   Sedimentation rate   ANA   Hyperlipidemia    Working towards repatha/praluent coverage. Pt motivated to start shot.       Hypothyroidism    Update TFTs on lower thyroid dose (brand synthroid 53mg daily).       Relevant Orders   TSH   S[place] ([place]ic [place] erythematosus) [place]       Follow up plan: Return if symptoms worsen or fail to improve.  JRia Bush MD

## 2017-10-11 NOTE — Patient Instructions (Addendum)
Unclear cause of recent chest pain ?lupus related. Let's check inflammatory labs today.  Good to see you today, call us with questions.

## 2017-10-11 NOTE — Assessment & Plan Note (Addendum)
Reassuring cardiac evaluation recently. She doesn't really have GERD symptoms to suggest GI related. Reproducible MSK pain - ?SLE related. Check ESR, ANA today. D dimer was markedly elevated but she had normal CTA. No LE symptoms.

## 2017-10-12 ENCOUNTER — Telehealth: Payer: Self-pay

## 2017-10-12 LAB — TSH: TSH: 0.3 u[IU]/mL — AB (ref 0.35–4.50)

## 2017-10-12 LAB — T4, FREE: FREE T4: 0.95 ng/dL (ref 0.60–1.60)

## 2017-10-12 LAB — ANA: ANA: POSITIVE — AB

## 2017-10-12 LAB — ANTI-NUCLEAR AB-TITER (ANA TITER): ANA Titer 1: 1:80 {titer} — ABNORMAL HIGH

## 2017-10-12 LAB — SEDIMENTATION RATE: Sed Rate: 27 mm/hr (ref 0–30)

## 2017-10-12 NOTE — Telephone Encounter (Signed)
Pt was seen by Dr Darnell Level on 10/11/17.

## 2017-10-12 NOTE — Telephone Encounter (Signed)
PLEASE NOTE: All timestamps contained within this report are represented as [place] Standard Time. CONFIDENTIALTY NOTICE: This fax transmission is intended only for the addressee. It contains information that is legally privileged, confidential or otherwise protected from use or disclosure. If you are not the intended recipient, you are strictly prohibited from reviewing, disclosing, copying using or disseminating any of this information or taking any action in reliance on or regarding this information. If you have received this fax in error, please notify us immediately by telephone so that we can arrange for its return to [place]. Phone: [phone], Toll-Free: [phone], Fax: [phone] Page: 1 of 2 Call Id: [id number] [place] Patient Name: Cassandra Benson Gender: Female DOB: 1940-11-24 Age: 77 Y 3 M 14 D Return Phone Number: [phone] (Primary), [phone] (Secondary) [place] [address] Client [facility] Night - Client Client Site [place] Physician [name] - MD Contact Type Call Who Is Calling Patient / Member / Family / Caregiver Call Type Triage / Clinical Relationship To Patient Self Return Phone Number [phone] (Secondary) Chief Complaint CHEST PAIN (>=21 years) - pain, pressure, heaviness or tightness Reason for Call Symptomatic / Request for [place] states had a really hard compression feeling in her chest; then back pain; and feels horrible since; elbows hurt; BP higher than normal 133/69; weak and dizzy; Translation No Nurse Assessment Nurse: Julien Girt, RN, Almyra Free Date/Time Eilene Ghazi Time): 10/01/2017 2:36:15 PM Confirm and document reason for call. If symptomatic, describe symptoms. ---Caller states had a really hard "compressed feeling" in  her chest, then back pain at 8:45 am and she has felt horrible ever since. Her left elbows hurts, her BP is higher than normal at 133/69. She has tingling in her left jaw and she feels weak and dizzy. Does the patient have any new or worsening symptoms? ---Yes Will a triage be completed? ---Yes Related visit to physician within the last 2 weeks? ---No Does the PT have any chronic conditions? (i.e. diabetes, asthma, etc.) ---Yes List chronic conditions. 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 ---No Guidelines Guideline Title Affirmed Question Affirmed Notes Nurse Date/Time (Eastern Time) Chest Pain [1] Chest pain lasts > 5 minutes AND [2] history of heart disease (i.e., heart attack, bypass surgery, angina, angioplasty, CHF; not just a heart murmur) Chancy Hurter 10/01/2017 2:41:16 PM PLEASE NOTE: All timestamps contained within this report are represented as [place] Standard Time. CONFIDENTIALTY NOTICE: This fax transmission is intended only for the addressee. It contains information that is legally privileged, confidential or otherwise protected from use or disclosure. If you are not the intended recipient, you are strictly prohibited from reviewing, disclosing, copying using or disseminating any of this information or taking any action in reliance on or regarding this information. If you have received this fax in error, please notify us immediately by telephone so that we can arrange for its return to [place]. Phone: [phone], Toll-Free: [phone], Fax: [phone] Page: 2 of 2 Call Id: [id number] [place]. Time Eilene Ghazi Time) Disposition Final User 10/01/2017 2:32:57 PM Send to Urgent Mikael Spray 10/01/2017 2:50:46 PM Called On-Call Provider Julien Girt, RN, Almyra Free Reason: Caller states she has called 911, her grandson is present . 10/01/2017 2:43:39 PM Call EMS 911 Now Yes Julien Girt, RN,  [place] Disagree/Comply Comply Caller Understands Yes  PreDisposition Did not know what to do Care Advice Given Per Guideline CALL EMS 911 NOW: Immediate medical attention is needed. You need to hang up and call 911 (or an ambulance). Psychologist, forensic Discretion: I'll call you back in a few minutes to be sure you were able to reach them.) CARE ADVICE given per Chest Pain (Adult) guideline.

## 2017-10-12 NOTE — Telephone Encounter (Signed)
Why are we getting duplicate messages from team health into chart >10 days later?

## 2017-10-16 ENCOUNTER — Encounter: Payer: Self-pay | Admitting: Family Medicine

## 2017-10-16 NOTE — ACP (Advance Care Planning) (Signed)
Advanced directive: brings forms which were scanned - HCPOA are son Cassandra Benson and Cassandra Benson. Does not want prolonged life support if terminal condition. HCPOA has authority to override directives.

## 2017-10-19 ENCOUNTER — Other Ambulatory Visit: Payer: Self-pay | Admitting: *Deleted

## 2017-10-19 DIAGNOSIS — I6529 Occlusion and stenosis of unspecified carotid artery: Secondary | ICD-10-CM

## 2017-10-23 ENCOUNTER — Other Ambulatory Visit: Payer: Self-pay

## 2017-10-23 MED ORDER — INSULIN ASPART 100 UNIT/ML ~~LOC~~ SOLN: SUBCUTANEOUS | 6 refills | Status: DC

## 2017-10-29 ENCOUNTER — Encounter: Payer: Self-pay | Admitting: Family Medicine

## 2017-10-30 ENCOUNTER — Ambulatory Visit: Payer: PPO | Admitting: Internal Medicine

## 2017-10-30 ENCOUNTER — Encounter: Payer: Self-pay | Admitting: Internal Medicine

## 2017-10-30 VITALS — BP 130/70 | HR 99 | Ht 63.0 in | Wt 151.4 lb

## 2017-10-30 DIAGNOSIS — E139 Other specified diabetes mellitus without complications: Secondary | ICD-10-CM

## 2017-10-30 DIAGNOSIS — E039 Hypothyroidism, unspecified: Secondary | ICD-10-CM | POA: Diagnosis not present

## 2017-10-30 DIAGNOSIS — E109 Type 1 diabetes mellitus without complications: Secondary | ICD-10-CM

## 2017-10-30 LAB — POCT GLYCOSYLATED HEMOGLOBIN (HGB A1C): Hemoglobin A1C: 6.7

## 2017-10-30 NOTE — Addendum Note (Signed)
Addended by: Drucilla Schmidt on: 10/30/2017 01:05 PM   Modules accepted: Orders

## 2017-10-30 NOTE — Patient Instructions (Addendum)
Please continue the following pump settings: - basal rates: 12 am: 0.800 units/h 3 am: 0.875 9:30 am: 0.100 5 pm: 1.500 6:30 pm: 1.300 11:30 pm: 1.800  - ICR:   12 am: 10 6 pm: 8 - target: 130-130 except 6-8 am and 6 pm-12 am: 100-100  - ISF: 35 - Insulin on Board: 4h  Please continue Metformin XR 500 mg 3x a day.  Please continue: - Synthroid 75 mcg daily 1 tab 6/7 days and 0.5 tabs 1/7 days  Take the thyroid hormone every day, with water, at least 30 minutes before breakfast, separated by at least 4 hours from: - acid reflux medications - calcium - iron - multivitamins  Please return in 4 months.

## 2017-10-30 NOTE — Progress Notes (Signed)
Patient ID: Cassandra Benson, female   DOB: 25-Dec-1940, 77 y.o.   MRN: [id number]   HPI: Cassandra Benson is a 77 y.o.-year-old female, returning for f/u for LADA, initially dx'ed in 1998 (77 y/o), started insulin at dx, started insulin pump in ~2008, uncontrolled, without complications and hypothyroidism. She previously saw endocrinology at [facility] (Dr. Janese Banks) and Dr Howell Rucks. Last visit with me 4 months ago.  She still has a lot of pbs with getting strips and supplies for her pump.   She was admitted for CP + SOB 10/01/2017.  Cardiac events and PE were ruled out.  She continues to have shortness of breath.  She is seeing cardiology (Dr. Rockey Situ).  Last hemoglobin A1c was: Lab Results  Component Value Date   HGBA1C 7.3 06/29/2017   HGBA1C 7.9 (H) 03/16/2017   HGBA1C 6.60f04/07/2017   Patient is on insulin pump in pump: Medtronic 723 - started in 09/2016, without CGM, uses Novolog in the pump. Was using [employer] for supplies but now forced to use [employer].  She is also on Metformin ER (DAW) 500 mg 3x a day. Cannot take Generic Metformin ER  because of diarrhea. Pump settings: - basal rates: 12 am: 0.800 units/h 3 am: 0.875 9:30 am: 0.100 5 pm: 1.500 6:30 pm: 1.300 11:30 pm: 1.800  - ICR:   12 am: 10 6 pm: 8 - target: 130-130 except 6-8 am and 6 pm-12 am: 100-100  - ISF: 35 - Insulin on Board: 4h - bolus wizard: on TDD from basal insulin: 82% >> 69% TDD from bolus insulin: 18% >> 31% Total daily dose: 40 units per day - extended bolusing: Not using - changes infusion site: q7 >> q 3-4 days >> q1 week (!) >> q 4 days - Meter: Bayer Contour - not covered  Pt checks her sugars 3-5x a day and they are 128 +/- 27 >> 115 +/- 43: - am:111-150, 162 >> 95-127 >> 73-102 - 2h after b'fast: 88-119 >> n/c >> 107-148 >> n/c - before lunch: 92-132 >> 130-131 >> 82-109, 139, 163 - 2h after lunch: 107 >> 76 >> n/c >> 93 - before dinner:  99-180, 202 >> 86 >> n/c >> 75-130, 182, 315 - 2h after  dinner: 133, 222>> n/c >> 219 >> 72-188, 235 - bedtime: n/c - nighttime:  43, 55 - midnight,204 x1 >> 92-141, 183 >> n/c Lowest sugar was 43 >> 82 at night >> 86 >> 72; she has hypoglycemia awareness in the 80s.  No history of hypoglycemia admission.  She does have a glucagon kit at home. Highest sugar was 222 >> 162 >> 315.  No history of DKA admissions.  Pt's meals are: - Breakfast: protein drink + almond milk - Lunch: PB jelly sandwich or sandwich with ham or cream cheese sandwich - Dinner: salads  - Snacks: pretzels; pork tenderloin + veggies; chicken + tenderloin  -No CKD, last BUN/creatinine:  Lab Results  Component Value Date   BUN 12 10/01/2017   BUN 16 08/11/2017   CREATININE 0.68 10/01/2017   CREATININE 0.83 08/11/2017  On lisinopril. -+ HL;  last set of lipids: Lab Results  Component Value Date   CHOL 251 (H) 08/11/2017   HDL 40.00 08/11/2017   LDLCALC 179 (H) 08/11/2017   LDLDIRECT 177.0 08/09/2016   TRIG 159.0 (H) 08/11/2017   CHOLHDL 6 08/11/2017  On Crestor 5 >> muscle cramps; on CoQ10.  Praluent was not covered. - last eye exam was in 10/2016: No DR  -  No numbness and tingling in her feet.  Hypothyroidism: - 2/2 Hashimoto's thyroiditis - family history of Graves' disease in mother - She was initially on Levoxyl 75 mcg daily 1 tab 6/7 days and 1.5 tabs 1/7 days, but she thought Levoxyl increased her lipid levels, so we changed to Synthroid.  She feels much better on this, and has less hair loss.    She takes Synthroid: - in am - fasting - at least 30 min from b'fast - no Ca, Fe, PPIs - + MVI at night - not on Biotin  Last TSH was still slightly low.  At that time, we decreased the dose to Synthroid 75 mcg 1 tab 6/7 days and 0.5 tabs 1/7 days Lab Results  Component Value Date   TSH 0.30 (L) 10/11/2017   She also has SLE.  We checked her for hirsutism and acne - labs normal: Component     Latest Ref Rng & Units 09/02/2014 09/08/2014  Testosterone      3 - 41 ng/dL 59 (H)   Testosterone Free     0.0 - 4.2 pg/mL 2.1   FSH     mIU/mL  68.6  LH     mIU/mL  43.4  DHEA-SO4     7 - 177 ug/dL 49   Cortisol, Plasma     ug/dL  1.3 (Normal Dexamethasone suppression test)   Estradiol     pg/mL  24.0   Also: Component     Latest Ref Rng & Units 01/04/2017  Cortisol - AM     mcg/dL 17.6  C206 ACTH     6 - 50 pg/mL 15   Labs showed no signs of adrenal insufficiency.  ROS: Constitutional: no weight gain/no weight loss, + fatigue, no subjective hyperthermia, no subjective hypothermia Eyes: no blurry vision, no xerophthalmia ENT: no sore throat, no nodules palpated in throat, no dysphagia, no odynophagia, no hoarseness Cardiovascular: no CP/+ SOB/no palpitations/no leg swelling Respiratory: no cough/+ SOB/no wheezing Gastrointestinal: no N/no V/no D/no C/no acid reflux Musculoskeletal: no muscle aches/no joint aches Skin: no rashes, + less hair loss, + more hirsutism Neurological: no tremors/no numbness/no tingling/no dizziness, + headaches  I reviewed pt's medications, allergies, PMH, social hx, family hx, and changes were documented in the history of present illness. Otherwise, unchanged from my initial visit note.  Past Medical History:  Diagnosis Date  . Allergy   . Arthritis   . Carotid stenosis, asymptomatic 06/19/2015   5-62% RICA 13-08% LICA rpt 1 yr (02/5783)   . Diabetes mellitus without complication ([place])   . Fibromyalgia    prior PCP  . GERD (gastroesophageal reflux disease)    prior PCP  . Glaucoma    Narrow angle  . History of blood clots    DVT, in 20s, none since  . History of chicken pox   . History of diverticulitis   . History of pericarditis 1986   with hospitalization  . History of pneumonia 2014  . History of shingles   . History of UTI   . Hyperlipidemia   . Hypothyroidism   . Lupus (systemic lupus erythematosus) ([place])   . Shoulder pain left   h/o RTC tendonitis and adhesive capsulitis  . Sleep  apnea    prior PCP  . Vitamin D deficiency    prior PCP   Past Surgical History:  Procedure Laterality Date  . ABDOMINAL HYSTERECTOMY  1978   fibroids and menorrhagia, ovaries remain  . CARDIAC CATHETERIZATION  2000  [facility] normal per patient  . COLONOSCOPY WITH ESOPHAGOGASTRODUODENOSCOPY (EGD)  03/2007   2 ulcers, benign polyp, rpt 5 yrs ([place], [place])  . PARTIAL HIP ARTHROPLASTY  2013   Right hip replacement  . TONSILLECTOMY AND ADENOIDECTOMY    . VAGINAL DELIVERY     x2, no complications   Social History   Social History  . Widowed   . Number of children: 2   Occupational History  Retired    Social History Main Topics  . Smoking status: Never Smoker  . Smokeless tobacco: Never Used  . Alcohol use No  . Drug use: No   Social History Narrative   Lives in [place] now. Recently moved from [place].   No pets.   Mother of Charlane Westry.   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 aspirin EC 81 MG tablet Take 81 mg by mouth daily.    . BD ULTRA-FINE LANCETS lancets Use as instructed upto 6 times daily 600 each 2  . Cholecalciferol 50000 UNITS TABS Take 25,000 mg by mouth every 14 (fourteen) days. Take one tablet by mouth every 14 days (2 weeks)    . ciprofloxacin (CILOXAN) 0.3 % ophthalmic solution Place 1 drop into the left eye every 4 (four) hours while awake. 5 mL 0  . Cyanocobalamin (B-12 SL) Place 1 drop under the tongue daily.     Marland Kitchen glucose blood (CONTOUR NEXT TEST) test strip Use as instructed to check 4 times daily 400 each 5  . ibuprofen (ADVIL,MOTRIN) 600 MG tablet Take 1 tablet (600 mg total) by mouth every 8 (eight) hours as needed. 30 tablet 0  .  insulin aspart (NOVOLOG) 100 UNIT/ML injection Use in an insulin pump upto 50 units per day 2 vial 6  . Krill Oil 500 MG CAPS Take 2 capsules (1,000 mg total) by mouth daily.    . Lecithin 400 MG CAPS Take 1 capsule by mouth 3 (three) times a week.     . levothyroxine (SYNTHROID) 75 MCG tablet Take 1 tablet (75 mcg total) by mouth daily before breakfast. 30 tablet 0  . lisinopril (PRINIVIL,ZESTRIL) 10 MG tablet Take 5 mg by mouth daily.  30 tablet 6  . Multiple Vitamins-Minerals (PRESERVISION AREDS) TABS Take 2 tablets by mouth daily.    . nitroGLYCERIN (NITROSTAT) 0.4 MG SL tablet Place 1 tablet (0.4 mg total) under the tongue every 5 (five) minutes as needed for chest pain. 30 tablet 0  . pantoprazole (PROTONIX) 40 MG tablet Take 1 tablet (40 mg total) by mouth daily. 30 tablet 1  . rosuvastatin (CRESTOR) 5 MG tablet Take 1 tablet (5 mg total) by mouth every other day. 30 tablet 6   No current facility-administered medications on file prior to visit.    Allergies  Allergen Reactions  . Iodinated Diagnostic Agents Other (See Comments)    Itching (severe) and chest tightness  . Penicillins Anaphylaxis, Swelling and Rash    Has patient had a PCN reaction causing immediate rash, facial/tongue/throat swelling, SOB or lightheadedness with hypotension: Yes Has patient had a PCN reaction causing severe rash involving mucus membranes or skin necrosis: Unknown Has patient had a PCN reaction that  required hospitalization: Unknown Has patient had a PCN reaction occurring within the last 10 years: No If all of the above answers are "NO", then may proceed with Cephalosporin use.   . Codeine Nausea Only  . Gabapentin     Gait abnormality  . Influenza Vaccines     Muscle weakness; unable to walk  . Nortriptyline     Eye swelling and mouth drawed up  . Pamelor [Nortriptyline Hcl]     Patient states caused her face to draw in together.  . Valsartan     Allergy to generic only Other reaction(s):  Cough "Hacking" cough  . Erythromycin Rash and Swelling  . Sulfa Antibiotics Rash   Family History  Problem Relation Age of Onset  . CAD Mother 52       MI, aortic valve issues  . COPD Mother   . Lupus Mother   . Graves' disease Mother   . Rheum arthritis Mother   . CAD Father 42       CABG x2, aortic valve replacement  . Stroke Sister   . Alcohol abuse Brother   . CAD Brother 73       MI  . Stroke Brother   . Seizures Son   . COPD Brother   . CAD Brother 49       stent  . Diabetes Brother   . Depression Grandchild   . Cancer Maternal Aunt        breast  . Breast cancer Maternal Aunt   . Diabetes Sister        deceased  . CAD Sister 66       stents  . Breast cancer Maternal Aunt    PE: BP 130/70   Pulse 99   Ht _0  (1.6 m)   Wt 151 lb 6.4 oz (68.7 kg)   SpO2 97%   BMI 26.82 kg/m  Wt Readings from Last 3 Encounters:  10/30/17 151 lb 6.4 oz (68.7 kg)  10/11/17 152 lb (68.9 kg)  10/03/17 153 lb (69.4 kg)   Constitutional: Normal weight, in NAD Eyes: PERRLA, EOMI, no exophthalmos ENT: moist mucous membranes, no thyromegaly, no cervical lymphadenopathy Cardiovascular: tachycardia, RR, No MRG Respiratory: CTA B Gastrointestinal: abdomen soft, NT, ND, BS+ Musculoskeletal: no deformities, strength intact in all 4 Skin: moist, warm, no rashes Neurological: no tremor with outstretched hands, DTR normal in all 4  ASSESSMENT: 1. DM1, uncontrolled, without long-term complications, but with hyperglycemia  2.  Hashimoto's hypothyroidism  PLAN:  1. Patient with long-standing, uncontrolled, LADA, on insulin pump and metformin.  We did not change her insulin pump settings at last visit as her HbA1c was better, at 7.3% and sugars are more controlled.  She was doing manual boluses and we discussed that these were not as efficient as using the bolus wizard ones.  She was also not changing the infusion set this frequently as recommended as she was having problems with the  [employer] - she called them and discussed with the vice president of the company and now she is getting her supplies regularly. She was also having problems with not enough test strips at last visit.  No problems with this now. - I strongly advised her at last visit to start using the pump as recommended so we can start making changes in her settings if still needed. - She continues metformin 3 times a day At this visit, her sugars are exemplary-in the first half of the day, but occasionally slightly  higher around dinnertime.  She has a very low basal rate between 9:30 AM to 5 PM, which appears to be working well for her.  I would not increase this now, although this may be the reason for the slightly higher sugars in the evening,   As she occasionally has sugars in the 70s around dinnertime, also.  She had one high sugar in the 300s but she was out and she could not bolus at that time. - I advised her to:  Patient Instructions  Please continue the following pump settings: - basal rates: 12 am: 0.800 units/h 3 am: 0.875 9:30 am: 0.100 5 pm: 1.500 6:30 pm: 1.300 11:30 pm: 1.800  - ICR:   12 am: 10 6 pm: 8 - target: 130-130 except 6-8 am and 6 pm-12 am: 100-100  - ISF: 35 - Insulin on Board: 4h  Please continue Metformin XR 500 mg 3x a day.  Please continue: - Synthroid 75 mcg daily 1 tab 6/7 days and 0.5 tabs 1/7 days  Take the thyroid hormone every day, with water, at least 30 minutes before breakfast, separated by at least 4 hours from: - acid reflux medications - calcium - iron - multivitamins  Please return in 4 months.  - today, HbA1c is 6.7% (better!) - continue checking sugars at different times of the day - check 4x a day, rotating checks - advised for yearly eye exams >> she is UTD - Refused flu shot - Return to clinic in 4 mo with sugar log    2.  Hashimoto's hypothyroidism - latest thyroid labs reviewed with pt >> TSH low, so we decreased her Synthroid  dose - now on Synthroid d.a.w. 75 mcg daily 1 tab 6/7 days and 0.5 tabs 1/7 days.  - pt feels good on this dose. - we discussed about taking the thyroid hormone every day, with water, >30 minutes before breakfast, separated by >4 hours from acid reflux medications, calcium, iron, multivitamins. Pt. is taking it correctly. - We will have her back in 3-4 weeks to repeat her thyroid labs on the new decreased dose  - time spent with the patient: 40 min, of which >50% was spent in reviewing her pump download (we will scan reports), discussing her  hyper-glycemic episodes, reviewing previous labs and pump settings and developing a plan to avoid hypo- and hyper-glycemia.  We also addressed her hypothyroidism.  Philemon Kingdom, MD PhD [facility] Endocr[facility]

## 2017-11-22 DIAGNOSIS — E119 Type 2 diabetes mellitus without complications: Secondary | ICD-10-CM | POA: Diagnosis not present

## 2017-11-22 LAB — HM DIABETES EYE EXAM

## 2017-11-23 ENCOUNTER — Other Ambulatory Visit: Payer: Self-pay | Admitting: Internal Medicine

## 2017-11-23 ENCOUNTER — Encounter: Payer: Self-pay | Admitting: Family Medicine

## 2017-11-27 ENCOUNTER — Other Ambulatory Visit (INDEPENDENT_AMBULATORY_CARE_PROVIDER_SITE_OTHER): Payer: PPO

## 2017-11-27 DIAGNOSIS — E039 Hypothyroidism, unspecified: Secondary | ICD-10-CM | POA: Diagnosis not present

## 2017-11-27 LAB — T4, FREE: FREE T4: 0.82 ng/dL (ref 0.60–1.60)

## 2017-11-27 LAB — TSH: TSH: 0.92 u[IU]/mL (ref 0.35–4.50)

## 2017-11-29 ENCOUNTER — Other Ambulatory Visit: Payer: Self-pay | Admitting: Family Medicine

## 2017-11-29 DIAGNOSIS — Z1231 Encounter for screening mammogram for malignant neoplasm of breast: Secondary | ICD-10-CM

## 2017-12-25 ENCOUNTER — Encounter: Payer: Self-pay | Admitting: Family Medicine

## 2017-12-25 ENCOUNTER — Ambulatory Visit (INDEPENDENT_AMBULATORY_CARE_PROVIDER_SITE_OTHER): Payer: PPO | Admitting: Family Medicine

## 2017-12-25 VITALS — BP 126/70 | HR 83 | Temp 97.9°F | Wt 152.0 lb

## 2017-12-25 DIAGNOSIS — M797 Fibromyalgia: Secondary | ICD-10-CM

## 2017-12-25 DIAGNOSIS — M329 Systemic lupus erythematosus, unspecified: Secondary | ICD-10-CM | POA: Diagnosis not present

## 2017-12-25 DIAGNOSIS — M7989 Other specified soft tissue disorders: Secondary | ICD-10-CM | POA: Diagnosis not present

## 2017-12-25 DIAGNOSIS — M79661 Pain in right lower leg: Secondary | ICD-10-CM

## 2017-12-25 DIAGNOSIS — E139 Other specified diabetes mellitus without complications: Secondary | ICD-10-CM | POA: Diagnosis not present

## 2017-12-25 DIAGNOSIS — M79669 Pain in unspecified lower leg: Secondary | ICD-10-CM | POA: Insufficient documentation

## 2017-12-25 DIAGNOSIS — E039 Hypothyroidism, unspecified: Secondary | ICD-10-CM

## 2017-12-25 LAB — CBC WITH DIFFERENTIAL/PLATELET
Basophils Absolute: 0.1 10*3/uL (ref 0.0–0.1)
Basophils Relative: 1.1 % (ref 0.0–3.0)
EOS PCT: 1.3 % (ref 0.0–5.0)
Eosinophils Absolute: 0.1 10*3/uL (ref 0.0–0.7)
HCT: 43.8 % (ref 36.0–46.0)
HEMOGLOBIN: 14.4 g/dL (ref 12.0–15.0)
Lymphocytes Relative: 25.5 % (ref 12.0–46.0)
Lymphs Abs: 1.9 10*3/uL (ref 0.7–4.0)
MCHC: 32.9 g/dL (ref 30.0–36.0)
MCV: 80.3 fl (ref 78.0–100.0)
MONO ABS: 0.5 10*3/uL (ref 0.1–1.0)
MONOS PCT: 6.4 % (ref 3.0–12.0)
Neutro Abs: 4.9 10*3/uL (ref 1.4–7.7)
Neutrophils Relative %: 65.7 % (ref 43.0–77.0)
Platelets: 263 10*3/uL (ref 150.0–400.0)
RBC: 5.46 Mil/uL — AB (ref 3.87–5.11)
RDW: 14.3 % (ref 11.5–15.5)
WBC: 7.5 10*3/uL (ref 4.0–10.5)

## 2017-12-25 LAB — COMPREHENSIVE METABOLIC PANEL
ALBUMIN: 4.1 g/dL (ref 3.5–5.2)
ALK PHOS: 71 U/L (ref 39–117)
ALT: 14 U/L (ref 0–35)
AST: 17 U/L (ref 0–37)
BUN: 15 mg/dL (ref 6–23)
CO2: 29 mEq/L (ref 19–32)
Calcium: 9.9 mg/dL (ref 8.4–10.5)
Chloride: 104 mEq/L (ref 96–112)
Creatinine, Ser: 0.87 mg/dL (ref 0.40–1.20)
GFR: 67.37 mL/min (ref >60.00)
Glucose, Bld: 160 mg/dL — ABNORMAL HIGH (ref 70–99)
POTASSIUM: 4.2 meq/L (ref 3.5–5.1)
Sodium: 140 mEq/L (ref 135–145)
TOTAL PROTEIN: 7.4 g/dL (ref 6.0–8.3)
Total Bilirubin: 0.4 mg/dL (ref 0.2–1.2)

## 2017-12-25 LAB — SEDIMENTATION RATE: SED RATE: 18 mm/h (ref 0–30)

## 2017-12-25 NOTE — Assessment & Plan Note (Addendum)
No signs of cellulitis today. Not quite consistent with knee etiology.  Localized to R popliteal region - check D dimer today.

## 2017-12-25 NOTE — Assessment & Plan Note (Signed)
Not consistent with flare of this - no significant memory fog. See above.

## 2017-12-25 NOTE — Progress Notes (Signed)
BP 126/70 (BP Location: Left Arm, Patient Position: Sitting, Cuff Size: Normal)   Pulse 83   Temp 97.9 F (36.6 C) (Oral)   Wt 152 lb (68.9 kg)   SpO2 97%   BMI 26.93 kg/m    CC: malaise, fatigue Subjective:    Patient ID: Cassandra Benson, female    DOB: 1940-12-21, 77 y.o.   MRN: [id number]  HPI: Cassandra Benson is a 77 y.o. female presenting on 12/25/2017 for Generalized Body Aches (Thinks it is Lupus flare. Also c/o fatigue, breakouts on left side of face and skin cracking on hands. Started about 2 wks ago. Taking Advil.); Flank Pain; and Knee Pain (Pain in posterior knee, swelling and warm to touch. Started 3-4 wks ago. Tried elevating, sometimes helpful.)   2 wk h/o body aches, hyperalgesia, fatigue and exhaustion. Skin on hands is cracking. L face into neck broken out in pustules. Elbows staying sore "like I fell on them" as well as L wrist discomfort. Wrist can get warm. Endorses some intermittent nausea. R popliteal discomfort and intermittent warmth, swelling. She is worried she is having lupus flare.   Denies fevers/chills. No memory trouble or fogginess.   No new products, detergents, soaps or shampoos.  No new medicines.  No new foods.   Takes 2 advil TID for last 2 weeks without improvement.  She does not have rheumatologist. Has seen several previously.   Relevant past medical, surgical, family and social history reviewed and updated as indicated. Interim medical history since our last visit reviewed. Allergies and medications reviewed and updated. Outpatient Medications Prior to Visit  Medication Sig Dispense Refill  . aspirin EC 81 MG tablet Take 81 mg by mouth daily.    . BD ULTRA-FINE LANCETS lancets Use as instructed upto 6 times daily 600 each 2  . Cholecalciferol 50000 UNITS TABS Take 25,000 mg by mouth every 14 (fourteen) days. Take one tablet by mouth every 14 days (2 weeks)    . ciprofloxacin (CILOXAN) 0.3 % ophthalmic solution Place 1 drop into the left  eye every 4 (four) hours while awake. 5 mL 0  . Cyanocobalamin (B-12 SL) Place 1 drop under the tongue daily.     Marland Kitchen glucose blood (CONTOUR NEXT TEST) test strip Use as instructed to check 4 times daily 400 each 5  . ibuprofen (ADVIL,MOTRIN) 600 MG tablet Take 1 tablet (600 mg total) by mouth every 8 (eight) hours as needed. 30 tablet 0  . insulin aspart (NOVOLOG) 100 UNIT/ML injection Use in an insulin pump upto 50 units per day 2 vial 6  . Krill Oil 500 MG CAPS Take 2 capsules (1,000 mg total) by mouth daily.    . Lecithin 400 MG CAPS Take 1 capsule by mouth 3 (three) times a week.     . levothyroxine (SYNTHROID, LEVOTHROID) 75 MCG tablet TAKE 1 TABLET BY MOUTH ONCE DAILY FOR 6 DAYS AND 1/2 TABLETS ON THE 7TH DAY  0  . lisinopril (PRINIVIL,ZESTRIL) 10 MG tablet Take 5 mg by mouth daily.  30 tablet 6  . Multiple Vitamins-Minerals (PRESERVISION AREDS) TABS Take 2 tablets by mouth daily.    . nitroGLYCERIN (NITROSTAT) 0.4 MG SL tablet Place 1 tablet (0.4 mg total) under the tongue every 5 (five) minutes as needed for chest pain. 30 tablet 0  . pantoprazole (PROTONIX) 40 MG tablet Take 1 tablet (40 mg total) by mouth daily. 30 tablet 1  . rosuvastatin (CRESTOR) 5 MG tablet Take 1 tablet (5 mg  total) by mouth every other day. 30 tablet 6  . levothyroxine (SYNTHROID, LEVOTHROID) 75 MCG tablet TAKE 1 TABLET BY MOUTH ONCE DAILY FOR 6 DAYS AND 1 AND 1/2 TABLETS ON THE 7TH DAY (Patient taking differently: TAKE 1 TABLET BY MOUTH ONCE DAILY FOR 6 DAYS AND 1/2 TABLETS ON THE 7TH DAY) 90 tablet 0   No facility-administered medications prior to visit.      Per HPI unless specifically indicated in ROS section below Review of Systems     Objective:    BP 126/70 (BP Location: Left Arm, Patient Position: Sitting, Cuff Size: Normal)   Pulse 83   Temp 97.9 F (36.6 C) (Oral)   Wt 152 lb (68.9 kg)   SpO2 97%   BMI 26.93 kg/m   Wt Readings from Last 3 Encounters:  12/25/17 152 lb (68.9 kg)  10/30/17 151  lb 6.4 oz (68.7 kg)  10/11/17 152 lb (68.9 kg)    Physical Exam  Constitutional: She appears well-developed and well-nourished. No distress.  HENT:  Head: Normocephalic and atraumatic.  Mouth/Throat: Oropharynx is clear and moist. No oropharyngeal exudate.  Geographical tongue  Eyes: Pupils are equal, round, and reactive to light. Conjunctivae and EOM are normal.  Neck: Normal range of motion. Neck supple. No thyromegaly present.  Mildly enlarged, tender salivary glands  Cardiovascular: Normal rate, regular rhythm, normal heart sounds and intact distal pulses.  No murmur heard. Pulmonary/Chest: Effort normal and breath sounds normal. No respiratory distress. She has no wheezes. She has no rales.  Musculoskeletal: She exhibits no edema.  L calf circ 30.5cm  R calf circ 32cm  Tender to palpation R popliteal region FROM at bilateral knees in flexion/extension without crepitus.   Lymphadenopathy:    She has no cervical adenopathy.  Skin: Skin is warm and dry. Rash (L lower face into jaw with papular rash) noted.  Psychiatric: She has a normal mood and affect.  Nursing note and vitals reviewed.  Results for orders placed or performed in visit on 11/27/17  TSH  Result Value Ref Range   TSH 0.92 0.35 - 4.50 uIU/mL  T4, free  Result Value Ref Range   Free T4 0.82 0.60 - 1.60 ng/dL   Lab Results  Component Value Date   HGBA1C 6.7 10/30/2017    Lab Results  Component Value Date   CREATININE 0.68 10/01/2017   BUN 12 10/01/2017   NA 140 10/01/2017   K 3.9 10/01/2017   CL 106 10/01/2017   CO2 23 10/01/2017   Lab Results  Component Value Date   WBC 9.6 10/01/2017   HGB 13.3 10/01/2017   HCT 39.8 10/01/2017   MCV 80.3 10/01/2017   PLT 222 10/01/2017      Assessment & Plan:   Problem List Items Addressed This Visit    Fibromyalgia    Not consistent with flare of this - no significant memory fog. See above.       Hypothyroidism    Reviewed new regimen per endo. Recent  TFTs normal.       Relevant Medications   levothyroxine (SYNTHROID, LEVOTHROID) 75 MCG tablet   LADA (latent autoimmune diabetes in adults), managed as type 1 (HCC)   Pain and swelling of right lower leg    No signs of cellulitis today. Not quite consistent with knee etiology.  Localized to R popliteal region - check D dimer today.       Relevant Orders   D-dimer, quantitative (not at [facility])   SLE (  systemic lupus erythematosus) ([place]) - Primary    Possible acute flare - check ANA with titers, ESR, CBC, CMP then consider prednisone course. I did suggest change NSAID regimen in interim to aleve 436m BID x 1 wk. She does not have current rheum, declines return at this time.       Relevant Orders   Comprehensive metabolic panel   CBC with Differential/Platelet   Sedimentation rate   ANA       No orders of the defined types were placed in this encounter.  Orders Placed This Encounter  Procedures  . Comprehensive metabolic panel  . CBC with Differential/Platelet  . Sedimentation rate  . ANA  . [facility]    Follow up plan: Return if symptoms worsen or fail to improve.  JRia Bush MD

## 2017-12-25 NOTE — Patient Instructions (Addendum)
Possible lupus flare. Labs today. Push fluids and rest in the interim.  Take aleve 2 tablets twice daily with meals for 5 days.

## 2017-12-25 NOTE — Assessment & Plan Note (Signed)
Reviewed new regimen per endo. Recent TFTs normal.

## 2017-12-25 NOTE — Assessment & Plan Note (Addendum)
Possible acute flare - check ANA with titers, ESR, CBC, CMP then consider prednisone course. I did suggest change NSAID regimen in interim to aleve 436m BID x 1 wk. She does not have current rheum, declines return at this time.

## 2017-12-27 ENCOUNTER — Telehealth: Payer: Self-pay | Admitting: Internal Medicine

## 2017-12-27 ENCOUNTER — Encounter: Payer: Self-pay | Admitting: Family Medicine

## 2017-12-27 LAB — ANA: ANA: POSITIVE — AB

## 2017-12-27 LAB — D-DIMER, QUANTITATIVE: D-Dimer, Quant: 1.21 mcg/mL FEU — ABNORMAL HIGH (ref <0.50)

## 2017-12-27 LAB — ANTI-NUCLEAR AB-TITER (ANA TITER): ANA Titer 1: 1:320 {titer} — ABNORMAL HIGH

## 2017-12-27 NOTE — Telephone Encounter (Signed)
Patient stated the medication Glucophage 500 mg time release has been discontinued, she will be out before the end of the week. Please advise

## 2017-12-28 NOTE — Telephone Encounter (Signed)
Sounds good

## 2017-12-28 NOTE — Telephone Encounter (Signed)
Does she agree to try again the generic Metformin ER 500 mg?  If not, we will probably need to stay off metformin.

## 2017-12-28 NOTE — Telephone Encounter (Signed)
Please advise 

## 2017-12-28 NOTE — Telephone Encounter (Signed)
Pt stated that metformin caused diarrhea for her so she stated that she would be willing to just stay off and monitor her blood sugars until next visit.

## 2017-12-30 ENCOUNTER — Encounter: Payer: Self-pay | Admitting: Family Medicine

## 2017-12-30 ENCOUNTER — Other Ambulatory Visit: Payer: Self-pay | Admitting: Family Medicine

## 2017-12-30 DIAGNOSIS — M7989 Other specified soft tissue disorders: Principal | ICD-10-CM

## 2017-12-30 DIAGNOSIS — M79661 Pain in right lower leg: Secondary | ICD-10-CM

## 2018-01-01 ENCOUNTER — Telehealth: Payer: Self-pay

## 2018-01-01 ENCOUNTER — Ambulatory Visit (INDEPENDENT_AMBULATORY_CARE_PROVIDER_SITE_OTHER): Payer: PPO

## 2018-01-01 ENCOUNTER — Other Ambulatory Visit: Payer: Self-pay | Admitting: Family Medicine

## 2018-01-01 DIAGNOSIS — M79661 Pain in right lower leg: Secondary | ICD-10-CM | POA: Diagnosis not present

## 2018-01-01 DIAGNOSIS — M7989 Other specified soft tissue disorders: Secondary | ICD-10-CM | POA: Diagnosis not present

## 2018-01-01 NOTE — Telephone Encounter (Signed)
Calls to report that venous doppler is negative.

## 2018-01-05 ENCOUNTER — Encounter: Payer: Self-pay | Admitting: Family Medicine

## 2018-01-05 MED ORDER — DICLOFENAC SODIUM 1 % TD GEL: 1.0000 "application " | Freq: Three times a day (TID) | TRANSDERMAL | 1 refills | Status: DC

## 2018-01-05 NOTE — Addendum Note (Signed)
Addended by: [name] on: 01/05/2018 02:02 PM   Modules accepted: Orders

## 2018-01-11 ENCOUNTER — Other Ambulatory Visit: Payer: Self-pay | Admitting: Family Medicine

## 2018-01-17 ENCOUNTER — Ambulatory Visit
Admission: RE | Admit: 2018-01-17 | Discharge: 2018-01-17 | Disposition: A | Discharge status: Home / Self Care | Payer: PPO | Source: Ambulatory Visit | Attending: Family Medicine | Admitting: Family Medicine

## 2018-01-17 DIAGNOSIS — Z1231 Encounter for screening mammogram for malignant neoplasm of breast: Secondary | ICD-10-CM | POA: Diagnosis not present

## 2018-01-17 LAB — HM MAMMOGRAPHY

## 2018-01-18 ENCOUNTER — Encounter: Payer: Self-pay | Admitting: Family Medicine

## 2018-01-27 ENCOUNTER — Emergency Department
Admission: EM | Admit: 2018-01-27 | Discharge: 2018-01-27 | Disposition: A | Discharge status: Home / Self Care | Payer: PPO | Attending: Emergency Medicine | Admitting: Emergency Medicine

## 2018-01-27 ENCOUNTER — Emergency Department: Payer: PPO

## 2018-01-27 DIAGNOSIS — R200 Anesthesia of skin: Secondary | ICD-10-CM | POA: Diagnosis not present

## 2018-01-27 DIAGNOSIS — R6884 Jaw pain: Secondary | ICD-10-CM | POA: Diagnosis not present

## 2018-01-27 DIAGNOSIS — I10 Essential (primary) hypertension: Secondary | ICD-10-CM | POA: Insufficient documentation

## 2018-01-27 DIAGNOSIS — E119 Type 2 diabetes mellitus without complications: Secondary | ICD-10-CM | POA: Insufficient documentation

## 2018-01-27 DIAGNOSIS — R079 Chest pain, unspecified: Secondary | ICD-10-CM | POA: Diagnosis not present

## 2018-01-27 DIAGNOSIS — R202 Paresthesia of skin: Secondary | ICD-10-CM | POA: Diagnosis not present

## 2018-01-27 DIAGNOSIS — Z96641 Presence of right artificial hip joint: Secondary | ICD-10-CM | POA: Diagnosis not present

## 2018-01-27 DIAGNOSIS — E039 Hypothyroidism, unspecified: Secondary | ICD-10-CM | POA: Insufficient documentation

## 2018-01-27 DIAGNOSIS — R0789 Other chest pain: Secondary | ICD-10-CM | POA: Diagnosis not present

## 2018-01-27 LAB — CBC
HEMATOCRIT: 42.2 % (ref 35.0–47.0)
HEMOGLOBIN: 14 g/dL (ref 12.0–16.0)
MCH: 26.5 pg (ref 26.0–34.0)
MCHC: 33.2 g/dL (ref 32.0–36.0)
MCV: 79.9 fL — AB (ref 80.0–100.0)
PLATELETS: 264 10*3/uL (ref 150–440)
RBC: 5.27 MIL/uL — ABNORMAL HIGH (ref 3.80–5.20)
RDW: 14.6 % — AB (ref 11.5–14.5)
WBC: 8.6 10*3/uL (ref 3.6–11.0)

## 2018-01-27 LAB — BASIC METABOLIC PANEL
ANION GAP: 8 (ref 5–15)
BUN: 14 mg/dL (ref 6–20)
CALCIUM: 9.8 mg/dL (ref 8.9–10.3)
CHLORIDE: 106 mmol/L (ref 101–111)
CO2: 26 mmol/L (ref 22–32)
Creatinine, Ser: 0.86 mg/dL (ref 0.44–1.00)
GFR calc Af Amer: >60 mL/min (ref >60)
GFR calc non Af Amer: >60 mL/min (ref >60)
GLUCOSE: 107 mg/dL — AB (ref 65–99)
POTASSIUM: 4 mmol/L (ref 3.5–5.1)
Sodium: 140 mmol/L (ref 135–145)

## 2018-01-27 LAB — TROPONIN I: Troponin I: <0.03 ng/mL (ref <0.03)

## 2018-01-27 NOTE — ED Triage Notes (Signed)
Ems called for jaw tingling - initial bp 180/88, hr 108. One spray of nitro - bp down to130/86. Hx of lupus and having chest pain but unsure if from her recent lupus outbreak. bs 110. 324 asa given. Has an insulin pump

## 2018-01-27 NOTE — ED Triage Notes (Signed)
Nitro spray did not change the sternum pain but states the facial numbness is lessened.

## 2018-01-27 NOTE — ED Provider Notes (Signed)
[facility] Emergency Department Provider Note       Time seen: ----------------------------------------- 5:59 PM on 01/27/2018 -----------------------------------------   I have reviewed the triage vital signs and the nursing notes.  HISTORY   Chief Complaint Numbness    HPI Cassandra Benson is a 77 y.o. female with a history of allergies, fibromyalgia, GERD, pericarditis, pneumonia who presents to the ED for jaw tingling.  Patient reports initial blood pressure 180/88 with a heart rate of 108.  She was given 1 spray of nitroglycerin which brought her blood pressure down.  She has a history of lupus and is having chest pain but she is unsure if this is from a recent lupus flare or not.  She was given aspirin prior to arrival.  She reports the facial numbness is improving now.  She denies fevers, chills or other complaints.    Past Medical History:  Diagnosis Date  . Allergy   . Arthritis   . Carotid stenosis, asymptomatic 06/19/2015   1-09% RICA 32-35% LICA rpt 1 yr (01/7321)   . Diabetes mellitus without complication ([place])   . Fibromyalgia    prior PCP  . GERD (gastroesophageal reflux disease)    prior PCP  . Glaucoma    Narrow angle  . History of blood clots    DVT, in 20s, none since  . History of chicken pox   . History of diverticulitis   . History of pericarditis 1986   with hospitalization  . History of pneumonia 2014  . History of shingles   . History of UTI   . Hyperlipidemia   . Hypothyroidism   . Lupus (systemic lupus erythematosus) ([place])   . Shoulder pain left   h/o RTC tendonitis and adhesive capsulitis  . Sleep apnea    prior PCP  . Vitamin D deficiency    prior PCP    Patient Active Problem List   Diagnosis Date Noted  . Pain and swelling of right lower leg 12/25/2017  . Fibromyalgia   . 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 Intractable episodic headache 03/13/2017  . Numbness and tingling  of both lower extremities 03/13/2017  . 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 Internal hordeolum of left eye 03/03/2017  . Fatty liver 02/13/2017  . Lightheadedness 12/28/2016  . LADA (latent autoimmune diabetes in adults), managed as type 1 ([place]) 10/03/2016  . Right shoulder pain 08/15/2016  . Grief counseling 02/02/2016  . Medicare annual wellness visit, subsequent 06/19/2015  . Health maintenance examination 06/19/2015  . Advanced care planning/counseling discussion 06/19/2015  . Carotid stenosis, asymptomatic 06/19/2015  . Vitamin D deficiency   . Family history of premature CAD 06/02/2015  . Chest pain 05/26/2015  . Midline low back pain 09/25/2014  . 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 Hyperlipidemia 07/30/2014  . Essential hypertension 07/17/2014  . Hypothyroidism 07/17/2014  . Adjustment disorder with mixed anxiety and depressed mood 07/17/2014  . Apnea, sleep 08/22/2012  . LBP (low back pain) 02/27/2012  . SLE (systemic lupus erythematosus) ([place]) 01/06/2012    Past Surgical History:  Procedure Laterality Date  . ABDOMINAL HYSTERECTOMY  1978   fibroids and menorrhagia, ovaries remain  . [facility] normal per patient  . COLONOSCOPY WITH ESOPHAGOGASTRODUODENOSCOPY (EGD)  03/2007   2 ulcers, benign polyp, rpt 5 yrs ([place], [place])  . PARTIAL HIP ARTHROPLASTY  2013   Right hip replacement  . TONSILLECTOMY AND ADENOIDECTOMY    .  VAGINAL DELIVERY     x2, no complications    Allergies Iodinated diagnostic agents; Penicillins; Codeine; Gabapentin; Influenza vaccines; Nortriptyline; Pamelor [nortriptyline hcl]; Valsartan; Erythromycin; and Sulfa antibiotics  Social History Social History   Tobacco Use  . Smoking status: Never Smoker  . Smokeless tobacco: Never Used  Substance Use Topics  . Alcohol use: No  . Drug use: No   Review of Systems Constitutional: Negative for fever. Cardiovascular: Positive for  chest pain Respiratory: Negative for shortness of breath. Gastrointestinal: Negative for abdominal pain, vomiting and diarrhea. Musculoskeletal: Negative for back pain. Skin: Negative for rash. Neurological: Positive for facial numbness  All systems negative/normal/unremarkable except as stated in the HPI  ____________________________________________   PHYSICAL EXAM:  VITAL SIGNS: ED Triage Vitals  Enc Vitals Group     BP --      Pulse Rate 01/27/18 1756 91     Resp 01/27/18 1756 20     Temp 01/27/18 1756 97.8 F (36.6 C)     Temp Source 01/27/18 1756 Oral     SpO2 01/27/18 1756 96 %     Weight 01/27/18 1752 157 lb (71.2 kg)     Height 01/27/18 1752 5\' 3"  (1.6 m)     Head Circumference --      Peak Flow --      Pain Score 01/27/18 1753 6     Pain Loc --      Pain Edu? --      Excl. in [place]? --    Constitutional: Alert and oriented. Well appearing and in no distress. Eyes: Conjunctivae are normal. Normal extraocular movements. ENT   Head: Normocephalic and atraumatic.   Nose: No congestion/rhinnorhea.   Mouth/Throat: Mucous membranes are moist.   Neck: No stridor. Cardiovascular: Normal rate, regular rhythm. No murmurs, rubs, or gallops. Respiratory: Normal respiratory effort without tachypnea nor retractions. Breath sounds are clear and equal bilaterally. No wheezes/rales/rhonchi. Gastrointestinal: Soft and nontender. Normal bowel sounds Musculoskeletal: Nontender with normal range of motion in extremities. No lower extremity tenderness nor edema. Neurologic:  Normal speech and language. No gross focal neurologic deficits are appreciated.  Skin:  Skin is warm, dry and intact. No rash noted. Psychiatric: Mood and affect are normal. Speech and behavior are normal.  ____________________________________________  EKG: Interpreted by me.  Sinus rhythm rate 87 bpm, normal PR interval, normal QRS, normal QT.  ____________________________________________  ED  COURSE:  As part of my medical decision making, I reviewed the following data within the [place] History obtained from family if available, nursing notes, old chart and ekg, as well as notes from prior ED visits. Patient presented for chest pain & facial numbness, we will assess with labs and imaging as indicated at this time.   Procedures ____________________________________________   LABS (pertinent positives/negatives)  Labs Reviewed  BASIC METABOLIC PANEL - Abnormal; Notable for the following components:      Result Value   Glucose, Bld 107 (*)    All other components within normal limits  CBC - Abnormal; Notable for the following components:   RBC 5.27 (*)    MCV 79.9 (*)    RDW 14.6 (*)    All other components within normal limits  TROPONIN I  TROPONIN I    RADIOLOGY  Chest x-ray Is unremarkable ____________________________________________  DIFFERENTIAL DIAGNOSIS   Anxiety, muscular skeletal pain, GERD, MI, unstable angina, lupus  FINAL ASSESSMENT AND PLAN  Chest pain, facial paresthesias   Plan: The patient had presented for  chest pain and facial paresthesias. Patient's labs are unremarkable. Patient's imaging is also unremarkable.  She is cleared for outpatient follow-up with her doctor.   Laurence Aly, MD   Note: This note was generated in part or whole with voice recognition software. Voice recognition is usually quite accurate but there are transcription errors that can and very often do occur. I apologize for any typographical errors that were not detected and corrected.     Earleen Newport, MD 01/27/18 [phone]

## 2018-01-27 NOTE — ED Notes (Signed)
Patient ambulated to and from room bathroom with a steady gait.

## 2018-01-27 NOTE — ED Notes (Signed)
Patient given diet soda with Dr. Grace Bushy approval.

## 2018-01-29 ENCOUNTER — Other Ambulatory Visit: Payer: Self-pay

## 2018-01-29 ENCOUNTER — Telehealth: Payer: Self-pay

## 2018-01-29 NOTE — Telephone Encounter (Signed)
Patient says the numbness and tingling went away when they gave her the Nitroglycerin.  Now, she is very weak, winded, and tired.  Patient states she has not been able to do do much of anything due to the weakness.  Patient has an appointment with Dr. Darnell Level on the 15th when he returns.  That appointment was made prior to his episode.

## 2018-01-29 NOTE — Telephone Encounter (Signed)
Please get update on patient.  Thanks. 

## 2018-01-29 NOTE — Telephone Encounter (Signed)
If her sx continue/worsen in the meantime then please move up her OV to a date prior to PCP returning to office.  Thanks.

## 2018-01-29 NOTE — Patient Outreach (Signed)
[facility]) Care Management  01/29/2018  Cassandra Benson May 16, 1941 [id number]   Telephone Screen  Referral Date: 01/29/18 Referral Source: [facility]. Referral Reason: " member would like medication co pay assistance with Novolog and thyroid medication, member is unable to take generic thyroid medication because of adverse side effects" Insurance:   Outreach attempt # 1 to patient. Spoke with patient and screening completed.   Social: Patient resides in her home alone. She stets that her spouse passed away about two years ago. She has a very supportive son and dtr in law that lives nearby and checks on her multiple times per day. Patient stets she is independent with ADLs/IADLs. She denies any recent falls. She has a cane that she uses.  Conditions: Per chart review, patient has PMH of fibromyalgia, GERD, Type 1 DM, pericarditis, PMA, carotid stenosis, HLD and lupus. Patient voices that she has been on an insulin pump for about 18 yrs now. She is very knowledgeable regarding her diabetes. She checks blood sugars about 5/day. She states that cbgs normally range in the 100s. Last A1C was 6.7(Feb 2019). She reports chronic pain related to medical issues but managed at present. Patient was in the ED on 01/28/18 for numbness to her face. All of her tests were negative. She reports that she has not had any further symptoms.   Medications: Patient taking more than 10 meds. She is managing her meds on her own. She voices that her insulin costs her over $100/month. She also states that her "supplies for insulin pump are not covered" and she has to pay out of pocket for them from supplier as she does not use the "preferred brand." Patient voices that most generics she is unable to tolerate which makes the cost of meds more expensive. She would like to discuss her concerns further with a pharmacist.    Appointments: She has appt with PCP on 02/07/18.   Consent: [facility] services discussed  with patient. She gave verbal consent. However, she does not feel like she needs RN or SW assistance at this time.    Plan: RN CM will send [place] referral for polypharmacy med review and further discussion regarding medication issues.    Enzo Montgomery, RN,BSN,CCM [place] Management Telephonic Care Management Coordinator Direct Phone: [phone] Toll Free: [phone] Fax: [phone]

## 2018-01-29 NOTE — Telephone Encounter (Signed)
Per chart review pt was seen [facility] ED on 01/27/18.

## 2018-01-29 NOTE — Telephone Encounter (Signed)
PLEASE NOTE: All timestamps contained within this report are represented as [place] Standard Time. CONFIDENTIALTY NOTICE: This fax transmission is intended only for the addressee. It contains information that is legally privileged, confidential or otherwise protected from use or disclosure. If you are not the intended recipient, you are strictly prohibited from reviewing, disclosing, copying using or disseminating any of this information or taking any action in reliance on or regarding this information. If you have received this fax in error, please notify us immediately by telephone so that we can arrange for its return to [place]. Phone: [phone], Toll-Free: [phone], Fax: [phone] Page: 1 of 2 Call Id: [id number] [place] Patient Name: Cassandra Benson Gender: Female DOB: 1941/08/22 Age: 77 Y 54 M 2 D Return Phone Number: [phone] (Primary), [phone] (Secondary) [place] [address] Client [place] Night - Client Client Site [place] Physician [name] - MD Contact Type Call Who Is Calling Patient / Member / Family / Caregiver Call Type Triage / Clinical Relationship To Patient Self Return Phone Number [phone] (Primary) Chief Complaint NUMBNESS/TINGLING- sudden on one side of the body or face Reason for Call Symptomatic / Request for [place] has a BP of 164/75 and she has a tingling in her face numbness in parts of her face and a headache. She normal does not have high BP. Her D-Dimer is high. They did a doppler on her legs to make sure she did not have blood clots. She is concerned about stoke symptoms right now. Translation No Nurse Assessment Nurse: Hassell Done, RN, Melanie Date/Time (Eastern Time): 01/27/2018 4:42:35 PM Confirm and document reason  for call. If symptomatic, describe symptoms. ---Caller states she is concerned about her B/P and has tingling and numbness of her face, This has been going on for about 3-4 hours. Takes lisinopril 10 mg , but pt only takes 1/2 tab daily. Took the second 1/2 today Does the patient have any new or worsening symptoms? ---Yes Will a triage be completed? ---Yes Related visit to physician within the last 2 weeks? ---No Does the PT have any chronic conditions? (i.e. diabetes, asthma, etc.) ---Yes List chronic conditions. ---lupus Is this a behavioral health or substance abuse call? ---No Guidelines Guideline Title Affirmed Question Affirmed Notes Nurse Date/Time (Eastern Time) High Blood Pressure [1] Numbness (i.e., loss of sensation) of the face, arm or leg on one side of the body AND [2] new onset Arnaldo Natal 01/27/2018 4:45:24 PM Disp. Time Eilene Ghazi Time) Disposition Final User 01/27/2018 4:40:26 PM Send to Urgent [place] PLEASE NOTE: All timestamps contained within this report are represented as [place] Standard Time. CONFIDENTIALTY NOTICE: This fax transmission is intended only for the addressee. It contains information that is legally privileged, confidential or otherwise protected from use or disclosure. If you are not the intended recipient, you are strictly prohibited from reviewing, disclosing, copying using or disseminating any of this information or taking any action in reliance on or regarding this information. If you have received this fax in error, please notify us immediately by telephone so that we can arrange for its return to [place]. Phone: [phone], Toll-Free: [phone], Fax: [phone] Page: 2 of 2 Call Id: [id number] [place]. Time Eilene Ghazi Time) Disposition Final User 01/27/2018 4:55:39 PM 911 Outcome Documentation Hassell Done, RN, Melanie Reason: Call back was made to pt. She was just getting ready to  call the EMS and said she had to first let her son  know.. 01/27/2018 4:48:50 PM Call EMS 911 Now Yes Hassell Done, RN, Donnajean Lopes Disagree/Comply Comply Caller Understands No PreDisposition Did not know what to do Care Advice Given Per Guideline CALL EMS 911 NOW: Immediate medical attention is needed. You need to hang up and call 911 (or an ambulance). Psychologist, forensic Discretion: I'll call you back in a few minutes to be sure you were able to reach them.) CARE ADVICE given per High Blood Pressure (Adult) guideline. Referrals [facility] - ED

## 2018-01-30 ENCOUNTER — Encounter: Payer: Self-pay | Admitting: Emergency Medicine

## 2018-01-30 ENCOUNTER — Emergency Department
Admission: EM | Admit: 2018-01-30 | Discharge: 2018-01-30 | Disposition: A | Discharge status: Home / Self Care | Payer: PPO | Attending: Emergency Medicine | Admitting: Emergency Medicine

## 2018-01-30 ENCOUNTER — Other Ambulatory Visit: Payer: Self-pay

## 2018-01-30 ENCOUNTER — Other Ambulatory Visit: Payer: Self-pay | Admitting: Pharmacist

## 2018-01-30 ENCOUNTER — Other Ambulatory Visit: Payer: Self-pay | Admitting: Pharmacy Technician

## 2018-01-30 ENCOUNTER — Emergency Department: Payer: PPO

## 2018-01-30 DIAGNOSIS — Z794 Long term (current) use of insulin: Secondary | ICD-10-CM | POA: Diagnosis not present

## 2018-01-30 DIAGNOSIS — R202 Paresthesia of skin: Secondary | ICD-10-CM

## 2018-01-30 DIAGNOSIS — E785 Hyperlipidemia, unspecified: Secondary | ICD-10-CM | POA: Insufficient documentation

## 2018-01-30 DIAGNOSIS — R200 Anesthesia of skin: Secondary | ICD-10-CM | POA: Diagnosis not present

## 2018-01-30 DIAGNOSIS — E119 Type 2 diabetes mellitus without complications: Secondary | ICD-10-CM | POA: Diagnosis not present

## 2018-01-30 DIAGNOSIS — M3210 Systemic lupus erythematosus, organ or system involvement unspecified: Secondary | ICD-10-CM | POA: Insufficient documentation

## 2018-01-30 DIAGNOSIS — I10 Essential (primary) hypertension: Secondary | ICD-10-CM | POA: Insufficient documentation

## 2018-01-30 DIAGNOSIS — E039 Hypothyroidism, unspecified: Secondary | ICD-10-CM | POA: Diagnosis not present

## 2018-01-30 DIAGNOSIS — Z86718 Personal history of other venous thrombosis and embolism: Secondary | ICD-10-CM | POA: Insufficient documentation

## 2018-01-30 DIAGNOSIS — R51 Headache: Secondary | ICD-10-CM | POA: Diagnosis not present

## 2018-01-30 DIAGNOSIS — Z79899 Other long term (current) drug therapy: Secondary | ICD-10-CM | POA: Diagnosis not present

## 2018-01-30 DIAGNOSIS — Z7982 Long term (current) use of aspirin: Secondary | ICD-10-CM | POA: Diagnosis not present

## 2018-01-30 LAB — CBC WITH DIFFERENTIAL/PLATELET
BASOS ABS: 0.1 10*3/uL (ref 0–0.1)
Basophils Relative: 2 %
Eosinophils Absolute: 0.1 10*3/uL (ref 0–0.7)
Eosinophils Relative: 2 %
HEMATOCRIT: 42.8 % (ref 35.0–47.0)
Hemoglobin: 14.1 g/dL (ref 12.0–16.0)
LYMPHS ABS: 2 10*3/uL (ref 1.0–3.6)
LYMPHS PCT: 25 %
MCH: 26.4 pg (ref 26.0–34.0)
MCHC: 33 g/dL (ref 32.0–36.0)
MCV: 80.1 fL (ref 80.0–100.0)
MONO ABS: 0.6 10*3/uL (ref 0.2–0.9)
Monocytes Relative: 8 %
NEUTROS ABS: 5.2 10*3/uL (ref 1.4–6.5)
Neutrophils Relative %: 65 %
Platelets: 250 10*3/uL (ref 150–440)
RBC: 5.34 MIL/uL — ABNORMAL HIGH (ref 3.80–5.20)
RDW: 14.6 % — AB (ref 11.5–14.5)
WBC: 8 10*3/uL (ref 3.6–11.0)

## 2018-01-30 LAB — BASIC METABOLIC PANEL
ANION GAP: 10 (ref 5–15)
BUN: 17 mg/dL (ref 6–20)
CALCIUM: 9.8 mg/dL (ref 8.9–10.3)
CO2: 25 mmol/L (ref 22–32)
Chloride: 101 mmol/L (ref 101–111)
Creatinine, Ser: 0.84 mg/dL (ref 0.44–1.00)
GFR calc Af Amer: >60 mL/min (ref >60)
GFR calc non Af Amer: >60 mL/min (ref >60)
GLUCOSE: 157 mg/dL — AB (ref 65–99)
POTASSIUM: 4.2 mmol/L (ref 3.5–5.1)
Sodium: 136 mmol/L (ref 135–145)

## 2018-01-30 MED ORDER — PREDNISONE 20 MG PO TABS: 20.0000 mg | ORAL_TABLET | Freq: Every day | ORAL | 0 refills | Status: AC

## 2018-01-30 MED ORDER — PREDNISONE 20 MG PO TABS
20.0000 mg | ORAL_TABLET | Freq: Once | ORAL | Status: AC
  Administered 2018-01-30: 20 mg via ORAL
  Filled 2018-01-30: qty 1

## 2018-01-30 NOTE — Patient Outreach (Signed)
[facility]) Care Management  01/30/2018  Cassandra Benson 04-25-1941 [id number]   Received [employer] patient assistance referral from [place] for patients Humalog. Patient portion to be mailed out 05/08. I also included a Waiver letter for patient to sign an return to submit with application due to patient not meeting the OOP spend requirements as of yet. Also per patients request did not fax provider portion to Dr. Renne Crigler.  Will follow up with patient in the next 7 days to confirm receipt of application. Will also wait to hear from patient or [name] to inform me when to fax provider portion.  Maud Deed [place], [place] Management [phone]

## 2018-01-30 NOTE — Telephone Encounter (Signed)
Pt called back; pt had taken nap and had missed calls while asleep; [place] CMA spoke with pt earlier; I do not know who was calling pt but pt condition has worsened; now pt has facial numbness from ear to ear and from nose down face. Advised pt to go back to ED for further eval. Pt voiced understanding and she will have someone drive her to [facility] ED. FYI to Dr Darnell Level and Dr Damita Dunnings.

## 2018-01-30 NOTE — ED Provider Notes (Signed)
[facility] Emergency Department Provider Note   ____________________________________________    I have reviewed the triage vital signs and the nursing notes.   HISTORY  Chief Complaint Numbness     HPI Cassandra Benson is a 77 y.o. female who presents with complaints of facial numbness.  Patient reports she had numbness and tingling that spread from her right cheek across her upper lip to her left cheek.  She also complains of a mild headache as well.  She states she believes this is related to lupus.  Additionally she complains of fatigue.  No neuro deficits.  No nausea or vomiting.  No head injury.  No fevers or chills   Past Medical History:  Diagnosis Date  . Allergy   . Arthritis   . Carotid stenosis, asymptomatic 06/19/2015   1-24% RICA 58-09% LICA rpt 1 yr (05/8337)   . Diabetes mellitus without complication ([place])   . Fibromyalgia    prior PCP  . GERD (gastroesophageal reflux disease)    prior PCP  . Glaucoma    Narrow angle  . History of blood clots    DVT, in 20s, none since  . History of chicken pox   . History of diverticulitis   . History of pericarditis 1986   with hospitalization  . History of pneumonia 2014  . History of shingles   . History of UTI   . Hyperlipidemia   . Hypothyroidism   . Lupus (systemic lupus erythematosus) ([place])   . Shoulder pain left   h/o RTC tendonitis and adhesive capsulitis  . Sleep apnea    prior PCP  . Vitamin D deficiency    prior PCP    Patient Active Problem List   Diagnosis Date Noted  . Pain and swelling of right lower leg 12/25/2017  . Fibromyalgia   . 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 Intractable episodic headache 03/13/2017  . Numbness and tingling of both lower extremities 03/13/2017  . 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 Internal hordeolum of left eye 03/03/2017  . Fatty liver 02/13/2017  . Lightheadedness 12/28/2016  . LADA (latent autoimmune  diabetes in adults), managed as type 1 ([place]) 10/03/2016  . Right shoulder pain 08/15/2016  . Grief counseling 02/02/2016  . Medicare annual wellness visit, subsequent 06/19/2015  . Health maintenance examination 06/19/2015  . Advanced care planning/counseling discussion 06/19/2015  . Carotid stenosis, asymptomatic 06/19/2015  . Vitamin D deficiency   . Family history of premature CAD 06/02/2015  . Chest pain 05/26/2015  . Midline low back pain 09/25/2014  . 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 Hyperlipidemia 07/30/2014  . Essential hypertension 07/17/2014  . Hypothyroidism 07/17/2014  . Adjustment disorder with mixed anxiety and depressed mood 07/17/2014  . Apnea, sleep 08/22/2012  . LBP (low back pain) 02/27/2012  . SLE (systemic lupus erythematosus) ([place]) 01/06/2012    Past Surgical History:  Procedure Laterality Date  . ABDOMINAL HYSTERECTOMY  1978   fibroids and menorrhagia, ovaries remain  . [facility] normal per patient  . COLONOSCOPY WITH ESOPHAGOGASTRODUODENOSCOPY (EGD)  03/2007   2 ulcers, benign polyp, rpt 5 yrs ([place])  . PARTIAL HIP ARTHROPLASTY  2013   Right hip replacement  . TONSILLECTOMY AND ADENOIDECTOMY    . VAGINAL DELIVERY     x2, no complications    Prior to Admission medications   Medication Sig Start Date End Date Taking? Authorizing Provider  aspirin EC 81 MG tablet Take 81 mg by mouth daily.    [provider]  BD ULTRA-FINE LANCETS lancets Use as instructed upto 6 times daily 09/02/14   Phadke, Radhika P, MD  ciprofloxacin (CILOXAN) 0.3 % ophthalmic solution Place 1 drop into the left eye every 4 (four) hours while awake. 03/03/17   [name], MD  co-enzyme Q-10 30 MG capsule Take 30 mg by mouth daily.    [provider]  Cyanocobalamin (B-12 SL) Place 1 drop under the tongue daily.     [provider]  diclofenac sodium (VOLTAREN) 1 % GEL Apply 1  application topically 3 (three) times daily. 01/05/18   [name], MD  ergocalciferol (VITAMIN D2) 50000 units capsule Take 50,000 Units by mouth as directed. Every 14 to 30 days    [provider]  glucose blood (CONTOUR NEXT TEST) test strip Use as instructed to check 4 times daily 07/03/17   Philemon Kingdom, MD  insulin aspart (NOVOLOG) 100 UNIT/ML injection Use in an insulin pump upto 50 units per day 10/23/17   Philemon Kingdom, MD  Javier Docker Oil 500 MG CAPS Take 2 capsules (1,000 mg total) by mouth daily. 09/20/17   [name], MD  Lecithin 400 MG CAPS Take 1 capsule by mouth 3 (three) times a week.     [provider]  levothyroxine (SYNTHROID, LEVOTHROID) 75 MCG tablet TAKE 1 TABLET BY MOUTH ONCE DAILY FOR 6 DAYS AND 1/2 TABLETS ON THE 7TH DAY 12/25/17   [name], MD  lisinopril (PRINIVIL,ZESTRIL) 10 MG tablet TAKE 1 TABLET BY MOUTH ONCE DAILY Patient taking differently: Take  tablet (5MG ) by mouth every morning 01/11/18   [name], MD  Multiple Vitamins-Minerals (PRESERVISION AREDS) TABS Take 2 tablets by mouth daily.    [provider]  nitroGLYCERIN (NITROSTAT) 0.4 MG SL tablet Place 1 tablet (0.4 mg total) under the tongue every 5 (five) minutes as needed for chest pain. 10/02/17   Vaughan Basta, MD  pantoprazole (PROTONIX) 40 MG tablet Take 1 tablet (40 mg total) by mouth daily. 12/19/16   [name], MD  predniSONE (DELTASONE) 20 MG tablet Take 1 tablet (20 mg total) by mouth daily for 5 days. 01/30/18 02/04/18  Lavonia Drafts, MD  rosuvastatin (CRESTOR) 5 MG tablet Take 1 tablet (5 mg total) by mouth every other day. 08/21/17   [name], MD     Allergies Iodinated diagnostic agents; Penicillins; Codeine; Gabapentin; Influenza vaccines; Nortriptyline; Pamelor [nortriptyline hcl]; Valsartan; Erythromycin; and Sulfa antibiotics  Family History  Problem Relation Age of Onset  . CAD Mother 84       MI, aortic  valve issues  . COPD Mother   . Lupus Mother   . Graves' disease Mother   . Rheum arthritis Mother   . CAD Father 60       CABG x2, aortic valve replacement  . Stroke Sister   . Alcohol abuse Brother   . CAD Brother 57       MI  . Stroke Brother   . Seizures Son   . COPD Brother   . CAD Brother 40       stent  . Diabetes Brother   . Depression Grandchild   . Cancer Maternal Aunt        breast  . Breast cancer Maternal Aunt   . Diabetes Sister        deceased  . CAD Sister 26       stents  .  Breast cancer Maternal Aunt     Social History Social History   Tobacco Use  . Smoking status: Never Smoker  . Smokeless tobacco: Never Used  Substance Use Topics  . Alcohol use: No  . Drug use: No    Review of Systems  Constitutional: No fever/chills Eyes: No visual changes.  ENT: No sore throat. Cardiovascular: Denies chest pain. Respiratory: Denies shortness of breath. Gastrointestinal: No abdominal pain.  No nausea, no vomiting.   Genitourinary: Negative for dysuria. Musculoskeletal: Negative for back pain. Skin: Negative for rash. Neurological: As above   ____________________________________________   PHYSICAL EXAM:  VITAL SIGNS: ED Triage Vitals  Enc Vitals Group     BP 01/30/18 1715 (!) 153/56     Pulse Rate 01/30/18 1715 80     Resp 01/30/18 1715 16     Temp 01/30/18 1715 98 F (36.7 C)     Temp Source 01/30/18 1715 Oral     SpO2 01/30/18 1715 98 %     Weight 01/30/18 1716 71.2 kg (157 lb)     Height 01/30/18 1716 1.6 m (5\' 3" )     Head Circumference --      Peak Flow --      Pain Score 01/30/18 1716 8     Pain Loc --      Pain Edu? --      Excl. in [place]? --     Constitutional: Alert and oriented. No acute distress. Pleasant and interactive Eyes: Conjunctivae are normal.  PERRLA, EOMI Head: Atraumatic. Nose: No congestion/rhinnorhea. Mouth/Throat: Mucous membranes are moist.   Neck:  Painless ROM Cardiovascular: Normal rate, regular rhythm.    Good peripheral circulation. Respiratory: Normal respiratory effort.  No retractions.  Gastrointestinal: Soft and nontender. No distention.  No CVA tenderness. Genitourinary: deferred Musculoskeletal: No lower extremity tenderness nor edema.  Warm and well perfused Neurologic:  Normal speech and language. No gross focal neurologic deficits are appreciated.  Cranial nerves II through XII are normal Skin:  Skin is warm, dry and intact. No rash noted. Psychiatric: Mood and affect are normal. Speech and behavior are normal.  ____________________________________________   LABS (all labs ordered are listed, but only abnormal results are displayed)  Labs Reviewed  CBC WITH DIFFERENTIAL/PLATELET - Abnormal; Notable for the following components:      Result Value   RBC 5.34 (*)    RDW 14.6 (*)    All other components within normal limits  BASIC METABOLIC PANEL - Abnormal; Notable for the following components:   Glucose, Bld 157 (*)    All other components within normal limits   ____________________________________________  EKG  ED ECG REPORT I, Lavonia Drafts, the attending physician, personally viewed and interpreted this ECG.  Date: 01/30/2018  Rhythm: normal sinus rhythm QRS Axis: normal Intervals: normal ST/T Wave abnormalities: normal Narrative Interpretation: no evidence of acute ischemia  ____________________________________________  RADIOLOGY  CT head normal ____________________________________________   PROCEDURES  Procedure(s) performed: No  Procedures   Critical Care performed: No ____________________________________________   INITIAL IMPRESSION / ASSESSMENT AND PLAN / ED COURSE  Pertinent labs & imaging results that were available during my care of the patient were reviewed by me and considered in my medical decision making (see chart for details).  Patient presents with unusual paresthesia not consistent with CVA which she is concerned about given  that it crosses the midline.  Symptoms have also resolved.  She attributes this to a lupus flare, and this is possible.  CT negative.  Blood work unremarkable.  EKG normal.  We will give p.o. prednisone and prescription, discussed with her that it will raise her blood sugars and she is aware    ____________________________________________   FINAL CLINICAL IMPRESSION(S) / ED DIAGNOSES  Final diagnoses:  Paresthesia        Note:  This document was prepared using Dragon voice recognition software and may include unintentional dictation errors.    Lavonia Drafts, MD 01/30/18 2248

## 2018-01-30 NOTE — Patient Outreach (Signed)
[facility]) Care Management  01/30/2018  Cassandra Benson 17-Nov-1940 [id number]  77 year old female referred to [facility] [employer] for medication assistance.  PMHx includes, but not limited to, LADA (latent autoimmune diabetes in adults, managed as type 1), Hashimoto's Disease, SLE, fibromyalgia, arthritis, allergies, GERD, macular degeneration,  glaucoma, pericarditis, CAD, carotid stenosis, HTN, HLD, and adjustment disorder with mixed anxiety and depression.   Noted patient with recent visit to ED for chest pain, facial paresthesias, and elevated blood pressure, symptoms resolving with NTG spray.  Chest x-ray, EKG normal, patient discharged home.  Symptoms returned yesterday, patient in contact with PCP office regarding treatment.   Per review of [facility], patient having trouble affording brand name Synthroid, Novolog, and diabetic testing supplies (using Contour Next).   Successful call placed to Cassandra Benson. HIPAA identifiers verified. Patient agreeable to [facility] pharmacy services. She reports frustration with cost of healthcare and medications, especially related to diabetes.    Medication assistance: Care coordination call placed to [facility]:   TROOP = $485  Contour test strips = $0 (override in place)  Lancets = $10 / 80 DS  Synthroid = $90 for 90 DS which will provide cost savings benefit over filling  $45 / 30 DS  Insulin must be billed through Part B rather than Part D due to patient requiring insulin pump.  Part B charges 20% coinsurance = $115 / 40 DS.    Programs:  Extra Help / :LIS: Patient not eligible based on stated income.    Novo Nordisk: Patient preliminarily eligible based on stated income, however has not met TROOP ($1000)  Lilly: Humalog insulin: Patient preliminarily eligible based on stated income, has not met TROOP but may be able to apply for financial hardship.    I reviewed information above with patient who voiced  understanding.  She would like for Cassandra Benson to mail her patient portion of Lily application but wait to send Cassandra Benson the provider portion until she speaks directly with PCP.  If she decides to move forward, we will request insulin formulation change from Novolog to Humalog so patient can immediately apply for PAP rather than wait until she spends additional $500 to meet TROOP.   Plan: I will route patient assistance letter to pharmacy technician, Cassandra Benson, who will coordinate application process for Humalog through [place].  She will assist with obtaining all pertinent documents from both patient and Cassandra Benson once patient makes decision and submit application once completed.    I will follow-up with Cassandra Benson next week following her appointment with PCP.  I will route my note to PCP and Cassandra Benson.   Cassandra [facility], PharmD, [place] [phone]

## 2018-01-30 NOTE — Telephone Encounter (Signed)
Patient advised.

## 2018-01-30 NOTE — ED Triage Notes (Signed)
Pt reports that she was here Saturday for numbness on the right side, today she took a nap and woke up with numbness that goes from her right to left side. She reports that she also has a headache.

## 2018-01-31 NOTE — Telephone Encounter (Signed)
Noted. Thanks.

## 2018-02-07 ENCOUNTER — Ambulatory Visit (INDEPENDENT_AMBULATORY_CARE_PROVIDER_SITE_OTHER): Payer: PPO | Admitting: Family Medicine

## 2018-02-07 ENCOUNTER — Encounter: Payer: Self-pay | Admitting: Family Medicine

## 2018-02-07 ENCOUNTER — Telehealth: Payer: Self-pay | Admitting: Family Medicine

## 2018-02-07 VITALS — BP 150/80 | HR 84 | Temp 98.2°F | Ht 63.0 in | Wt 155.0 lb

## 2018-02-07 DIAGNOSIS — M329 Systemic lupus erythematosus, unspecified: Secondary | ICD-10-CM

## 2018-02-07 DIAGNOSIS — R209 Unspecified disturbances of skin sensation: Secondary | ICD-10-CM

## 2018-02-07 DIAGNOSIS — R202 Paresthesia of skin: Secondary | ICD-10-CM | POA: Insufficient documentation

## 2018-02-07 DIAGNOSIS — I10 Essential (primary) hypertension: Secondary | ICD-10-CM | POA: Diagnosis not present

## 2018-02-07 LAB — FOLATE: FOLATE: 9.7 ng/mL (ref >5.9)

## 2018-02-07 LAB — VITAMIN B12: VITAMIN B 12: 688 pg/mL (ref 211–911)

## 2018-02-07 NOTE — Telephone Encounter (Signed)
Spoke with pt relaying message per Dr. Darnell Level.  Pt verbalizes understanding and wanted to let Dr. Darnell Level know she has been taking 1 tab of the lisinopril as originally prescribed.  Pt denies using anything on her lower face.  Also, wanted to let Dr. Darnell Level know the gel he prescribed has really helped her knee pain.

## 2018-02-07 NOTE — Assessment & Plan Note (Addendum)
Presumed lupus flare that started >1 month ago (body aches, hyperalgesia, fatigue and exhaustion, arthralgias). ESR normal last month, but speckled ANA did increase from 1:80 to 1:320. Other labs including Cr, CBC stable.  Discussed return to rheum (has not seen recently). Requests [place] if needed. See below.  Previously did not tolerate hydroxychloroquine (diarrhea). Recent prednisone course did not help.

## 2018-02-07 NOTE — Patient Instructions (Signed)
Check labs today. If normal, we will refer you to rheumatology for recheck lupus.

## 2018-02-07 NOTE — Progress Notes (Signed)
BP (!) 150/80 (BP Location: Left Arm, Cuff Size: Normal)   Pulse 84   Temp 98.2 F (36.8 C) (Oral)   Ht _0  (1.6 m)   Wt 155 lb (70.3 kg)   SpO2 97%   BMI 27.46 kg/m    CC: ER f/u visit Subjective:    Patient ID: Cassandra Benson, female    DOB: Nov 23, 1940, 77 y.o.   MRN: [id number]  HPI: Cassandra Benson is a 77 y.o. female presenting on 02/07/2018 for Lupus (States she had postive Lupus test recently. Pt was seen at [facility] on 01/30/18, dx parathesia.  Said she awoke from a nap and had numbness in both sides of lower face. )   Recent ER visit reviewed x2 this month for facial numbness initially R side of lower face, then traveled to L face. ?lupus flare - started by ER on prednisone course which caused hyperglycemia but didn't seem to help numbness. Persistent facial numbness today. Endorses some axillary tenderness, "?lumpiness". Some R parietal headache described as pressure and soreness. No skin rash other than some acne noted on bilateral cheeks recently. No temporal pain, no vision changes. No burning pain. Some dry cough present over the last week. No new or changing neck pain. No weight changes. ER records reviewed - head CT was normal. CXR was normal.  Known carotid stenosis, last [place] 08/2017 rec rpt 1 yr.  Noted hoarseness today.   Relevant past medical, surgical, family and social history reviewed and updated as indicated. Interim medical history since our last visit reviewed. Allergies and medications reviewed and updated. Outpatient Medications Prior to Visit  Medication Sig Dispense Refill  . aspirin EC 81 MG tablet Take 81 mg by mouth daily.    . BD ULTRA-FINE LANCETS lancets Use as instructed upto 6 times daily 600 each 2  . ciprofloxacin (CILOXAN) 0.3 % ophthalmic solution Place 1 drop into the left eye every 4 (four) hours while awake. 5 mL 0  . co-enzyme Q-10 30 MG capsule Take 30 mg by mouth daily.    . Cyanocobalamin (B-12 SL) Place 1 drop under the tongue daily.      . diclofenac sodium (VOLTAREN) 1 % GEL Apply 1 application topically 3 (three) times daily. 1 Tube 1  . ergocalciferol (VITAMIN D2) 50000 units capsule Take 50,000 Units by mouth as directed. Every 14 to 30 days    . glucose blood (CONTOUR NEXT TEST) test strip Use as instructed to check 4 times daily 400 each 5  . insulin aspart (NOVOLOG) 100 UNIT/ML injection Use in an insulin pump upto 50 units per day 2 vial 6  . Krill Oil 500 MG CAPS Take 2 capsules (1,000 mg total) by mouth daily.    . Lecithin 400 MG CAPS Take 1 capsule by mouth 3 (three) times a week.     . levothyroxine (SYNTHROID, LEVOTHROID) 75 MCG tablet TAKE 1 TABLET BY MOUTH ONCE DAILY FOR 6 DAYS AND 1/2 TABLETS ON THE 7TH DAY  0  . lisinopril (PRINIVIL,ZESTRIL) 10 MG tablet TAKE 1 TABLET BY MOUTH ONCE DAILY (Patient taking differently: Take  tablet (5MG) by mouth every morning) 30 tablet 7  . Multiple Vitamins-Minerals (PRESERVISION AREDS) TABS Take 2 tablets by mouth daily.    . nitroGLYCERIN (NITROSTAT) 0.4 MG SL tablet Place 1 tablet (0.4 mg total) under the tongue every 5 (five) minutes as needed for chest pain. 30 tablet 0  . pantoprazole (PROTONIX) 40 MG tablet Take 1 tablet (40 mg  total) by mouth daily. 30 tablet 1  . rosuvastatin (CRESTOR) 5 MG tablet Take 1 tablet (5 mg total) by mouth every other day. 30 tablet 6   No facility-administered medications prior to visit.     Past Medical History:  Diagnosis Date  . Allergy   . Arthritis   . Carotid stenosis, asymptomatic 06/19/2015   0-94% RICA 70-96% LICA rpt 1 yr (10/8364)   . Diabetes mellitus without complication ([place])   . Fibromyalgia    prior PCP  . GERD (gastroesophageal reflux disease)    prior PCP  . Glaucoma    Narrow angle  . History of blood clots    DVT, in 20s, none since  . History of chicken pox   . History of diverticulitis   . History of pericarditis 1986   with hospitalization  . History of pneumonia 2014  . History of shingles   . History  of UTI   . Hyperlipidemia   . Hypothyroidism   . Lupus (systemic lupus erythematosus) ([place])   . Shoulder pain left   h/o RTC tendonitis and adhesive capsulitis  . Sleep apnea    prior PCP  . Vitamin D deficiency    prior PCP    Past Surgical History:  Procedure Laterality Date  . ABDOMINAL HYSTERECTOMY  1978   fibroids and menorrhagia, ovaries remain  . [facility] normal per patient  . COLONOSCOPY WITH ESOPHAGOGASTRODUODENOSCOPY (EGD)  03/2007   2 ulcers, benign polyp, rpt 5 yrs ([place], [place])  . PARTIAL HIP ARTHROPLASTY  2013   Right hip replacement  . TONSILLECTOMY AND ADENOIDECTOMY    . VAGINAL DELIVERY     x2, no complications   Family History  Problem Relation Age of Onset  . CAD Mother 19       MI, aortic valve issues  . COPD Mother   . Lupus Mother   . Graves' disease Mother   . Rheum arthritis Mother   . CAD Father 75       CABG x2, aortic valve replacement  . Stroke Sister   . Alcohol abuse Brother   . CAD Brother 58       MI  . Stroke Brother   . Seizures Son   . COPD Brother   . CAD Brother 12       stent  . Diabetes Brother   . Depression Grandchild   . Cancer Maternal Aunt        breast  . Breast cancer Maternal Aunt   . Diabetes Sister        deceased  . CAD Sister 80       stents  . Breast cancer Maternal Aunt     Social History   Tobacco Use  . Smoking status: Never Smoker  . Smokeless tobacco: Never Used  Substance Use Topics  . Alcohol use: No  . Drug use: No    Per HPI unless specifically indicated in ROS section below Review of Systems     Objective:    BP (!) 150/80 (BP Location: Left Arm, Cuff Size: Normal)   Pulse 84   Temp 98.2 F (36.8 C) (Oral)   Ht _0  (1.6 m)   Wt 155 lb (70.3 kg)   SpO2 97%   BMI 27.46 kg/m   Wt Readings from Last 3 Encounters:  02/07/18 155 lb (70.3 kg)  01/30/18 157 lb (71.2 kg)  01/27/18 157 lb (71.2 kg)  Physical Exam  Constitutional:  She appears well-developed and well-nourished. No distress.  HENT:  Head: Normocephalic and atraumatic.  Mouth/Throat: Oropharynx is clear and moist. No oropharyngeal exudate.  Eyes: Pupils are equal, round, and reactive to light. Conjunctivae and EOM are normal.  Neck: Normal range of motion. Neck supple. Carotid bruit is present (left sided). No thyromegaly present.  Cardiovascular: Normal rate, regular rhythm, normal heart sounds and intact distal pulses.  No murmur heard. Pulmonary/Chest: Effort normal and breath sounds normal. No respiratory distress. She has no wheezes. She has no rales.  Musculoskeletal:  Tender to palpation R>L axillary region without obvious mass, lumps appreciated  Lymphadenopathy:       Head (right side): No submental, no submandibular, no tonsillar, no preauricular and no posterior auricular adenopathy present.       Head (left side): No submental, no submandibular, no tonsillar, no preauricular and no posterior auricular adenopathy present.    She has no cervical adenopathy.       Right cervical: No superficial cervical adenopathy present.      Left cervical: No superficial cervical adenopathy present.    She has no axillary adenopathy.       Right axillary: No lateral adenopathy present.       Left axillary: No lateral adenopathy present.      Right: No supraclavicular adenopathy present.       Left: No supraclavicular adenopathy present.  Neurological: She is alert.  CN 2-12 intact Station and gait intact  Skin: Skin is warm.  Papulopustular breakout along bilateral lower face No rash along scalp  Psychiatric: She has a normal mood and affect.  Nursing note and vitals reviewed.  Results for orders placed or performed during the hospital encounter of 01/30/18  CBC with Differential/Platelet  Result Value Ref Range   WBC 8.0 3.6 - 11.0 K/uL   RBC 5.34 (H) 3.80 - 5.20 MIL/uL   Hemoglobin 14.1 12.0 - 16.0 g/dL   HCT 42.8 35.0 - 47.0 %   MCV 80.1 80.0  - 100.0 fL   MCH 26.4 26.0 - 34.0 pg   MCHC 33.0 32.0 - 36.0 g/dL   RDW 14.6 (H) 11.5 - 14.5 %   Platelets 250 150 - 440 K/uL   Neutrophils Relative % 65 %   Neutro Abs 5.2 1.4 - 6.5 K/uL   Lymphocytes Relative 25 %   Lymphs Abs 2.0 1.0 - 3.6 K/uL   Monocytes Relative 8 %   Monocytes Absolute 0.6 0.2 - 0.9 K/uL   Eosinophils Relative 2 %   Eosinophils Absolute 0.1 0 - 0.7 K/uL   Basophils Relative 2 %   Basophils Absolute 0.1 0 - 0.1 K/uL  Basic metabolic panel  Result Value Ref Range   Sodium 136 135 - 145 mmol/L   Potassium 4.2 3.5 - 5.1 mmol/L   Chloride 101 101 - 111 mmol/L   CO2 25 22 - 32 mmol/L   Glucose, Bld 157 (H) 65 - 99 mg/dL   BUN 17 6 - 20 mg/dL   Creatinine, Ser 0.84 0.44 - 1.00 mg/dL   Calcium 9.8 8.9 - 10.3 mg/dL   GFR calc non Af Amer >60 >60 mL/min   GFR calc Af Amer >60 >60 mL/min   Anion gap 10 5 - 15   CT Head Wo Contrast CLINICAL DATA:  Patient states that she was here on Saturday for numbness on the right side. Patient now has left-sided numbness after waking from a nap. Headache.  EXAM:  CT HEAD WITHOUT CONTRAST  TECHNIQUE: Contiguous axial images were obtained from the base of the skull through the vertex without intravenous contrast.  COMPARISON:  MRI brain 03/13/2017  FINDINGS: Brain: Noncontrast imaging the brain demonstrates no acute infarct, hemorrhage, or mass lesion. Mild atrophy and white matter changes are similar the prior study. The ventricles are proportionate to the degree of atrophy.  Vascular: Atherosclerotic calcifications are present within the cavernous internal carotid arteries bilaterally. There is no hyperdense vessel.  Skull: Calvarium is intact. No focal lytic or blastic lesions are present. No significant extra-axial fluid collection is present.  Sinuses/Orbits: Paranasal sinuses and mastoid air cells are clear. Globes and orbits are within normal limits.  IMPRESSION: Normal CT appearance of the brain for  age. No acute intracranial abnormality.  Electronically Signed   By: San Morelle M.D.   On: 01/30/2018 22:05      Assessment & Plan:   Problem List Items Addressed This Visit    Essential hypertension    bp elevated today - will need to monitor this closely. If persistently elevated at home, will recommend increase in lisinopril dose to 70m daily.       Facial paresthesia - Primary    ?SLE related. Prednisone course started at ER did not help - pointing against inflammatory cause of symptoms. Recent head CT was reassuring. Reviewed brain MRI from 02/2017 which was also stable. Discussed options - neuro referral, rheum eval, rpt brain MRI. Given recent reassuring MRI, will defer repeat at this time. Check B12 today, if normal, pt prefers to start with rheum eval to see about possible lupus flare as cause.       Relevant Orders   Vitamin B12   Folate   SLE (systemic lupus erythematosus) (HCC)    Presumed lupus flare that started >1 month ago (body aches, hyperalgesia, fatigue and exhaustion, arthralgias). ESR normal last month, but speckled ANA did increase from 1:80 to 1:320. Other labs including Cr, CBC stable.  Discussed return to rheum (has not seen recently). Requests [place] if needed. See below.  Previously did not tolerate hydroxychloroquine (diarrhea). Recent prednisone course did not help.           No orders of the defined types were placed in this encounter.  Orders Placed This Encounter  Procedures  . Vitamin B12  . Folate    Follow up plan: Return if symptoms worsen or fail to improve.  JRia Bush MD

## 2018-02-07 NOTE — Telephone Encounter (Addendum)
Plz call - as I forgot to discuss this - bp elevated in office today. I recommend she start monitoring BP at home and let us know if consistently >140/90, may need increased lisinopril dose.  Also forgot to ask - has she been using any new creams to lower face that could have contributed to numbness sensation?

## 2018-02-07 NOTE — Assessment & Plan Note (Addendum)
?  SLE related. Prednisone course started at ER did not help - pointing against inflammatory cause of symptoms. Recent head CT was reassuring. Reviewed brain MRI from 02/2017 which was also stable. Discussed options - neuro referral, rheum eval, rpt brain MRI. Given recent reassuring MRI, will defer repeat at this time. Check B12 today, if normal, pt prefers to start with rheum eval to see about possible lupus flare as cause.

## 2018-02-07 NOTE — Assessment & Plan Note (Signed)
bp elevated today - will need to monitor this closely. If persistently elevated at home, will recommend increase in lisinopril dose to 10mg  daily.

## 2018-02-08 ENCOUNTER — Other Ambulatory Visit: Payer: Self-pay | Admitting: Pharmacist

## 2018-02-08 ENCOUNTER — Ambulatory Visit: Payer: Self-pay | Admitting: Pharmacist

## 2018-02-08 NOTE — Patient Outreach (Signed)
[facility]) Care Management  02/08/2018  Cassandra Benson Feb 16, 1941 [id number]   Telephonic outreach to patient on 01/30/18 to review high cost of insulin and possibly applying for patient assistance programs to receive medication at no cost through the end of the calendar year.  One option is for patient to change from Novolog --> Humalog as this PAP program may override TROOP requirement if patient submits letter stating financial hardship.   Patient would like to review substitution from Novolog to Humalog with PCP and endocrinologist before making decision on applying for PAP.   Successful call placed to Cassandra Benson today. HIPAA identifiers verified.  Patient reports she is not feeling well and has laryngitis and lupus flare-up.   Patient portion of PAP application form for Humalog mailed to patient last week but she reports she has not received this yet.    Patient had office visit with  Dr. Leo Grosser yesterday but forgot to discuss patient assistance program (PAP) for Humalog substitution. She would like to discuss with Dr. Cruzita Lederer (endocrinologist) on June 5th before completing her application and requests that I follow-up with her after this appointment.   Plan: I will reach out to [name] regarding medication assistance with insulin on June 6th, 2019 unless I hear back from patient beforehand.    Ralene Bathe, PharmD, [place] [phone]

## 2018-02-11 ENCOUNTER — Other Ambulatory Visit: Payer: Self-pay | Admitting: Family Medicine

## 2018-02-11 ENCOUNTER — Encounter: Payer: Self-pay | Admitting: Family Medicine

## 2018-02-11 DIAGNOSIS — M329 Systemic lupus erythematosus, unspecified: Secondary | ICD-10-CM

## 2018-02-11 DIAGNOSIS — R202 Paresthesia of skin: Secondary | ICD-10-CM

## 2018-02-11 DIAGNOSIS — R209 Unspecified disturbances of skin sensation: Principal | ICD-10-CM

## 2018-02-14 ENCOUNTER — Telehealth: Payer: Self-pay | Admitting: Family Medicine

## 2018-02-14 DIAGNOSIS — R202 Paresthesia of skin: Secondary | ICD-10-CM

## 2018-02-14 DIAGNOSIS — M329 Systemic lupus erythematosus, unspecified: Secondary | ICD-10-CM

## 2018-02-14 DIAGNOSIS — R209 Unspecified disturbances of skin sensation: Principal | ICD-10-CM

## 2018-02-14 DIAGNOSIS — R200 Anesthesia of skin: Secondary | ICD-10-CM

## 2018-02-14 NOTE — Telephone Encounter (Addendum)
Spoke with pt relaying Dr. Synthia Innocent message. Pt verbalizes understanding.    Pt is scheduled on 02/16/18 @ 11:30 AM.

## 2018-02-14 NOTE — Telephone Encounter (Signed)
Received a call from Dr Trudie Reed Rheumatology office. After reviewing the notes Dr Trudie Reed feels the patient should see a Neurologist first before seeing her. She feels her symptoms are very unusual for SLE. She will be happy to see her after the Neurology consult.

## 2018-02-14 NOTE — Telephone Encounter (Signed)
plz call patient - we touched base with Dr Trudie Reed at rheumatology about her symptoms - she recommends we start with neurology evaluation for facial numbness and tingling prior to sending her to rheum - I have placed neurology referral. After this we will send her to Dr Trudie Reed for rheumatology evaluation. I think this is a reasonable request from rheum.  Also, I received her mychart message, plz offer her appt to discuss concerns she had at her convenience - she requested 30 min appt.

## 2018-02-15 NOTE — Telephone Encounter (Signed)
Appt made with Dr Melrose Nakayama on 03/13/18 and patient aware.

## 2018-02-16 ENCOUNTER — Encounter: Payer: Self-pay | Admitting: Family Medicine

## 2018-02-16 ENCOUNTER — Ambulatory Visit (INDEPENDENT_AMBULATORY_CARE_PROVIDER_SITE_OTHER): Payer: PPO | Admitting: Family Medicine

## 2018-02-16 ENCOUNTER — Ambulatory Visit: Payer: PPO | Admitting: Family Medicine

## 2018-02-16 VITALS — BP 144/80 | HR 92 | Temp 97.9°F | Ht 63.0 in | Wt 152.2 lb

## 2018-02-16 DIAGNOSIS — I10 Essential (primary) hypertension: Secondary | ICD-10-CM

## 2018-02-16 NOTE — Patient Instructions (Addendum)
Good to see you today. Continue to monitor blood pressures at home and let us know how they're running.  Return in 2 weeks for blood pressure check.

## 2018-02-16 NOTE — Progress Notes (Signed)
BP (!) 144/80 (BP Location: Right Arm, Cuff Size: Normal)   Pulse 92   Temp 97.9 F (36.6 C) (Oral)   Ht 5\' 3"  (1.6 m)   Wt 152 lb 4 oz (69.1 kg)   SpO2 95%   BMI 26.97 kg/m   On repeat, 170/90  CC: several concerns Subjective:    Patient ID: Cassandra Benson, female    DOB: 23-Mar-1941, 77 y.o.   MRN: [id number]  HPI: Cassandra Benson is a 77 y.o. female presenting on 02/16/2018 for Discuss medical concerns (Pt states she has several issues to discuss )   Home BPs 108-112/60s. Current regimen is lisinopril 10mg  1/2 tablet daily in the morning, if bp elevated then takes another 1/2 at night time.  Worried about son and his remote medical history - discussed concerns. At 32yo son had epileptic seizure. Prior on phenobarb and mellaril. Taken off meds several years later, no recurrent seizures since. He has had 3 concussions in last few years.   Relevant past medical, surgical, family and social history reviewed and updated as indicated. Interim medical history since our last visit reviewed. Allergies and medications reviewed and updated. Outpatient Medications Prior to Visit  Medication Sig Dispense Refill  . aspirin EC 81 MG tablet Take 81 mg by mouth daily.    . BD ULTRA-FINE LANCETS lancets Use as instructed upto 6 times daily 600 each 2  . ciprofloxacin (CILOXAN) 0.3 % ophthalmic solution Place 1 drop into the left eye every 4 (four) hours while awake. 5 mL 0  . co-enzyme Q-10 30 MG capsule Take 30 mg by mouth daily.    . Cyanocobalamin (B-12 SL) Place 1 drop under the tongue daily.     . diclofenac sodium (VOLTAREN) 1 % GEL Apply 1 application topically 3 (three) times daily. 1 Tube 1  . ergocalciferol (VITAMIN D2) 50000 units capsule Take 50,000 Units by mouth as directed. Every 14 to 30 days    . glucose blood (CONTOUR NEXT TEST) test strip Use as instructed to check 4 times daily 400 each 5  . insulin aspart (NOVOLOG) 100 UNIT/ML injection Use in an insulin pump upto 50 units  per day 2 vial 6  . Krill Oil 500 MG CAPS Take 2 capsules (1,000 mg total) by mouth daily.    . Lecithin 400 MG CAPS Take 1 capsule by mouth 3 (three) times a week.     . levothyroxine (SYNTHROID, LEVOTHROID) 75 MCG tablet TAKE 1 TABLET BY MOUTH ONCE DAILY FOR 6 DAYS AND 1/2 TABLETS ON THE 7TH DAY  0  . lisinopril (PRINIVIL,ZESTRIL) 10 MG tablet TAKE 1 TABLET BY MOUTH ONCE DAILY (Patient taking differently: Take  tablet (5MG ) by mouth every morning) 30 tablet 7  . Multiple Vitamins-Minerals (PRESERVISION AREDS) TABS Take 2 tablets by mouth daily.    . nitroGLYCERIN (NITROSTAT) 0.4 MG SL tablet Place 1 tablet (0.4 mg total) under the tongue every 5 (five) minutes as needed for chest pain. 30 tablet 0  . pantoprazole (PROTONIX) 40 MG tablet Take 1 tablet (40 mg total) by mouth daily. 30 tablet 1  . rosuvastatin (CRESTOR) 5 MG tablet Take 1 tablet (5 mg total) by mouth every other day. 30 tablet 6   No facility-administered medications prior to visit.      Per HPI unless specifically indicated in ROS section below Review of Systems     Objective:    BP (!) 144/80 (BP Location: Right Arm, Cuff Size: Normal)  Pulse 92   Temp 97.9 F (36.6 C) (Oral)   Ht 5\' 3"  (1.6 m)   Wt 152 lb 4 oz (69.1 kg)   SpO2 95%   BMI 26.97 kg/m   Wt Readings from Last 3 Encounters:  02/16/18 152 lb 4 oz (69.1 kg)  02/07/18 155 lb (70.3 kg)  01/30/18 157 lb (71.2 kg)    Physical Exam  Constitutional: She appears well-developed and well-nourished. No distress.  Psychiatric: She has a normal mood and affect.  Nursing note and vitals reviewed.  Results for orders placed or performed in visit on 02/07/18  Vitamin B12  Result Value Ref Range   Vitamin B-12 688 211 - 911 pg/mL  Folate  Result Value Ref Range   Folate 9.7 >5.9 ng/mL      Assessment & Plan:  Over 25 minutes were spent face-to-face with the patient during this encounter and >50% of that time was spent on counseling and coordination of  care. Discussed her concerns regarding son, encouraged she talk with him about medical history then may come see me if any questions or concerns.  Problem List Items Addressed This Visit    Essential hypertension    Chronic, elevated again today however endorses home readings are better. I asked her to bring BP cuff to next visit to compare with ours. She will continue monitoring BP at home and take lisinopril 5mg  daily with 2nd 5mg  dose if needed. RTC 2 wks close HTN f/u.          No orders of the defined types were placed in this encounter.  No orders of the defined types were placed in this encounter.   Follow up plan: Return in about 2 weeks (around 03/02/2018) for follow up visit.  [name], MD

## 2018-02-16 NOTE — Assessment & Plan Note (Addendum)
Chronic, elevated again today however endorses home readings are better. I asked her to bring BP cuff to next visit to compare with ours. She will continue monitoring BP at home and take lisinopril 5mg  daily with 2nd 5mg  dose if needed. RTC 2 wks close HTN f/u.

## 2018-02-28 ENCOUNTER — Encounter: Payer: Self-pay | Admitting: Internal Medicine

## 2018-02-28 ENCOUNTER — Ambulatory Visit: Payer: PPO | Admitting: Internal Medicine

## 2018-02-28 VITALS — BP 126/64 | HR 79 | Ht 63.0 in | Wt 152.8 lb

## 2018-02-28 DIAGNOSIS — E139 Other specified diabetes mellitus without complications: Secondary | ICD-10-CM | POA: Diagnosis not present

## 2018-02-28 DIAGNOSIS — E038 Other specified hypothyroidism: Secondary | ICD-10-CM

## 2018-02-28 DIAGNOSIS — E063 Autoimmune thyroiditis: Secondary | ICD-10-CM | POA: Diagnosis not present

## 2018-02-28 LAB — POCT GLYCOSYLATED HEMOGLOBIN (HGB A1C): Hemoglobin A1C: 7.7 % — AB (ref 4.0–5.6)

## 2018-02-28 NOTE — Patient Instructions (Addendum)
Please continue the following pump settings: - basal rates: 12 am: 0.800 units/h 3 am: 0.875 9:30 am: 0.100 5 pm: 1.500 6:30 pm: 1.300 11:30 pm: 1.800  - ICR:   12 am: 10 (8 if on prednisone) 6 pm: 8 (6 if on Prednisone) - target: 130-130 except 6-8 am and 6 pm-12 am: 100-100 - ISF: 35 - Insulin on Board: 4h  Please continue: - Synthroid 75 mcg daily 1 tab 6/7 days and 0.5 tabs 1/7 days  Take the thyroid hormone every day, with water, at least 30 minutes before breakfast, separated by at least 4 hours from: - acid reflux medications - calcium - iron - multivitamins  Please return in 4 months.

## 2018-02-28 NOTE — Progress Notes (Signed)
Patient ID: Cassandra Benson, female   DOB: 1941-01-24, 77 y.o.   MRN: [id number]   HPI: Cassandra Benson is a 77 y.o.-year-old female, returning for f/u for LADA, initially dx'ed in 1998 (77 y/o), started insulin at dx, started insulin pump in ~2008, uncontrolled, without complications and hypothyroidism. She previously saw endocrinology at [place] (Dr. Janese Banks) and Dr Howell Rucks. Last visit with me 76 months ago.  She had a SLE flare >> was on Prednisone 20 mg for 1 week >> sugars much higher, but improved in last 2 weeks.  Last hemoglobin A1c was: Lab Results  Component Value Date   HGBA1C 6.7 10/30/2017   HGBA1C 7.3 06/29/2017   HGBA1C 7.9 (H) 03/16/2017   Patient is on insulin pump in pump:  - Medtronic 723- started in 09/2016 (changed 07/2017),  without CGM, uses NovoLog in the pump.  was using [employer] for supplies before but now forced to use [employer].  She she was previously on Glucophage ER d.a.w. but had to stop due to not being able to obtain it.  Generic metformin ER gave her diarrhea in the past.  Pump settings: - basal rates: 12 am: 0.800 units/h 3 am: 0.875 9:30 am: 0.100 5 pm: 1.500 6:30 pm: 1.300 11:30 pm: 1.800  - ICR:   12 am: 10 6 pm: 8 - target: 130-130 except 6-8 am and 6 pm-12 am: 100-100 - ISF: 35 - Insulin on Board: 4h - bolus wizard: On TDD from basal insulin: 82% >> 69% >> 65% TDD from bolus insulin: 18% >> 31% >> 35% Total daily dose: 40 units per day - extended bolusing: Not using - changes infusion site: q 5 days - Meter: Bayer Contour   Pt checks her sugars 4x a day and they are  115 +/- 43 >> excellent in the last 2 weeks: 128 +/- 31: - am:111-150, 162 >> 95-127 >> 73-102 >> 98-137, 148 - 2h after b'fast:  n/c >> 107-148 >> n/c  - before lunch:  82-109, 139, 163 >> 97-143, 155 - 2h after lunch:  76 >> n/c >> 93 >> 87-129 - before dinner: n/c >> 75-130, 182, 315 >> 95-173 - 2h after dinner: 219 >> 72-188, 235 >> 182, 244 (2 checks in last 2 weeks) -  bedtime: n/c - nighttime:   92-141, 183 >> n/c Lowest sugar was 43 >> ... >> 72 >> 63; she has hypoglycemia awareness in the 80s.  No history of hypoglycemia admission.  She does have a glucagon kit at home. Highest sugar was 315 >> 300 (prednisone); no history of.   DKA admissions.  Pt's meals are: - Breakfast: protein drink + almond milk - Lunch: PB jelly sandwich or sandwich with ham or cream cheese sandwich - Dinner: salads  - Snacks: pretzels; pork tenderloin + veggies; chicken + tenderloin  -No CKD, last BUN/creatinine:  Lab Results  Component Value Date   BUN 17 01/30/2018   BUN 14 01/27/2018   CREATININE 0.84 01/30/2018   CREATININE 0.86 01/27/2018  On lisinopril. -+ HL;  last set of lipids: Lab Results  Component Value Date   CHOL 251 (H) 08/11/2017   HDL 40.00 08/11/2017   LDLCALC 179 (H) 08/11/2017   LDLDIRECT 177.0 08/09/2016   TRIG 159.0 (H) 08/11/2017   CHOLHDL 6 08/11/2017  On she is on Crestor 5 daily but has muscle cramps.  She continues CoQ10.Nelida Meuse was not covered. - last eye exam was in 10/2017: No DR - no numbness and tingling  in her feet.  She was admitted for CP + SOB 10/01/2017.  Cardiac events and PE were ruled out.  She continues to have shortness of breath.  She is seeing cardiology (Dr. Rockey Situ).  Hypothyroidism: - 2/2 Hashimoto's thyroiditis - family history of Graves' disease in mother - She was initially on Levoxyl 75 mcg daily 1 tab 6/7 days and 1.5 tabs 1/7 days, but she thought Levoxyl increased her lipid levels, so we changed to Synthroid.  She feels much better on the brand-name, without hair loss.  She takes Synthroid: - in am - fasting - at least 30 min from b'fast - no Ca, Fe, PPIs - + MVIs at night - not on Biotin  She currently is on Synthroid 75 mcg 1 tab 6/7 days and 0.5 tabs 1/7 days.  On this dose, latest TSH was normal: Lab Results  Component Value Date   TSH 0.92 11/27/2017   She also has SLE.  We checked her  for hirsutism and acne and the labs are normal: Component     Latest Ref Rng & Units 09/02/2014 09/08/2014  Testosterone     3 - 41 ng/dL 59 (H)   Testosterone Free     0.0 - 4.2 pg/mL 2.1   FSH     mIU/mL  68.6  LH     mIU/mL  43.4  DHEA-SO4     7 - 177 ug/dL 49   Cortisol, Plasma     ug/dL  1.3 (Normal Dexamethasone suppression test)   Estradiol     pg/mL  24.0   Also: Component     Latest Ref Rng & Units 01/04/2017  Cortisol - AM     mcg/dL 17.6  C206 ACTH     6 - 50 pg/mL 15   No signs of adrenal insufficiency.  ROS: Constitutional: no weight gain/no weight loss, + fatigue, no subjective hyperthermia, no subjective hypothermia Eyes: no blurry vision, no xerophthalmia ENT: no sore throat, no nodules palpated in throat, no dysphagia, no odynophagia, no hoarseness Cardiovascular: + CP (bone pain)/no SOB/no palpitations/no leg swelling Respiratory: + cough/no SOB/no wheezing Gastrointestinal: no N/no V/no D/no C/no acid reflux Musculoskeletal:  + muscle aches/+ joint aches Skin: no rashes, + acne, + stretch marks + acne Neurological: no tremors/no numbness/no tingling/no dizziness  I reviewed pt's medications, allergies, PMH, social hx, family hx, and changes were documented in the history of present illness. Otherwise, unchanged from my initial visit note.   Past Medical History:  Diagnosis Date  . Allergy   . Arthritis   . Carotid stenosis, asymptomatic 06/19/2015   1-49% RICA 70-26% LICA rpt 1 yr (11/7856)   . Diabetes mellitus without complication ([place])   . Fibromyalgia    prior PCP  . GERD (gastroesophageal reflux disease)    prior PCP  . Glaucoma    Narrow angle  . History of blood clots    DVT, in 20s, none since  . History of chicken pox   . History of diverticulitis   . History of pericarditis 1986   with hospitalization  . History of pneumonia 2014  . History of shingles   . History of UTI   . Hyperlipidemia   . Hypothyroidism   . Lupus  (systemic lupus erythematosus) ([place])   . Shoulder pain left   h/o RTC tendonitis and adhesive capsulitis  . Sleep apnea    prior PCP  . Vitamin D deficiency    prior PCP   Past Surgical History:  Procedure Laterality Date  . ABDOMINAL HYSTERECTOMY  1978   fibroids and menorrhagia, ovaries remain  . [facility] normal per patient  . COLONOSCOPY WITH ESOPHAGOGASTRODUODENOSCOPY (EGD)  03/2007   2 ulcers, benign polyp, rpt 5 yrs ([place], [place])  . PARTIAL HIP ARTHROPLASTY  2013   Right hip replacement  . TONSILLECTOMY AND ADENOIDECTOMY    . VAGINAL DELIVERY     x2, no complications   Social History   Social History  . Widowed   . Number of children: 2   Occupational History  Retired    Social History Main Topics  . Smoking status: Never Smoker  . Smokeless tobacco: Never Used  . Alcohol use No  . Drug use: No   Social History Narrative   Lives in [place] now. Recently moved from [place].   No pets.   Mother of Glorian Mcdonell.   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 aspirin EC 81 MG tablet Take 81 mg by mouth daily.    . BD ULTRA-FINE LANCETS lancets Use as instructed upto 6 times daily 600 each 2  . ciprofloxacin (CILOXAN) 0.3 % ophthalmic solution Place 1 drop into the left eye every 4 (four) hours while awake. 5 mL 0  . co-enzyme Q-10 30 MG capsule Take 30 mg by mouth daily.    . Cyanocobalamin (B-12 SL) Place 1 drop under the tongue daily.     . diclofenac sodium (VOLTAREN) 1 % GEL Apply 1 application topically 3 (three) times daily. 1 Tube 1  . ergocalciferol (VITAMIN D2) 50000 units capsule Take 50,000 Units by mouth as  directed. Every 14 to 30 days    . glucose blood (CONTOUR NEXT TEST) test strip Use as instructed to check 4 times daily 400 each 5  . insulin aspart (NOVOLOG) 100 UNIT/ML injection Use in an insulin pump upto 50 units per day 2 vial 6  . Krill Oil 500 MG CAPS Take 2 capsules (1,000 mg total) by mouth daily.    . Lecithin 400 MG CAPS Take 1 capsule by mouth 3 (three) times a week.     . levothyroxine (SYNTHROID, LEVOTHROID) 75 MCG tablet TAKE 1 TABLET BY MOUTH ONCE DAILY FOR 6 DAYS AND 1/2 TABLETS ON THE 7TH DAY  0  . lisinopril (PRINIVIL,ZESTRIL) 10 MG tablet TAKE 1 TABLET BY MOUTH ONCE DAILY (Patient taking differently: Take  tablet (5MG) by mouth every morning) 30 tablet 7  . Multiple Vitamins-Minerals (PRESERVISION AREDS) TABS Take 2 tablets by mouth daily.    . nitroGLYCERIN (NITROSTAT) 0.4 MG SL tablet Place 1 tablet (0.4 mg total) under the tongue every 5 (five) minutes as needed for chest pain. 30 tablet 0  . pantoprazole (PROTONIX) 40 MG tablet Take 1 tablet (40 mg total) by mouth daily. 30 tablet 1  . rosuvastatin (CRESTOR) 5 MG tablet Take 1 tablet (5 mg total) by mouth every other day. 30 tablet 6   No current facility-administered medications on file prior to visit.    Allergies  Allergen Reactions  . Iodinated Diagnostic Agents Other (See Comments)    Itching (severe) and chest tightness  . Penicillins Anaphylaxis,  Swelling and Rash    Has patient had a PCN reaction causing immediate rash, facial/tongue/throat swelling, SOB or lightheadedness with hypotension: Yes Has patient had a PCN reaction causing severe rash involving mucus membranes or skin necrosis: Unknown Has patient had a PCN reaction that required hospitalization: Unknown Has patient had a PCN reaction occurring within the last 10 years: No If all of the above answers are "NO", then may proceed with Cephalosporin use.   . Codeine Nausea Only  . Gabapentin     Gait abnormality  . Influenza Vaccines     Muscle  weakness; unable to walk  . Nortriptyline     Eye swelling and mouth drawed up  . Pamelor [Nortriptyline Hcl]     Patient states caused her face to draw in together.  . Valsartan     Allergy to generic only Other reaction(s): Cough "Hacking" cough  . Erythromycin Rash and Swelling  . Sulfa Antibiotics Rash   Family History  Problem Relation Age of Onset  . CAD Mother 12       MI, aortic valve issues  . COPD Mother   . Lupus Mother   . Graves' disease Mother   . Rheum arthritis Mother   . CAD Father 59       CABG x2, aortic valve replacement  . Stroke Sister   . Alcohol abuse Brother   . CAD Brother 62       MI  . Stroke Brother   . Seizures Son   . COPD Brother   . CAD Brother 67       stent  . Diabetes Brother   . Depression Grandchild   . Cancer Maternal Aunt        breast  . Breast cancer Maternal Aunt   . Diabetes Sister        deceased  . CAD Sister 1       stents  . Breast cancer Maternal Aunt    PE: BP 126/64   Pulse 79   Ht '5\' 3"'  (1.6 m)   Wt 152 lb 12.8 oz (69.3 kg)   SpO2 97%   BMI 27.07 kg/m  Wt Readings from Last 3 Encounters:  02/28/18 152 lb 12.8 oz (69.3 kg)  02/16/18 152 lb 4 oz (69.1 kg)  02/07/18 155 lb (70.3 kg)   Constitutional: normal weight, in NAD Eyes: PERRLA, EOMI, no exophthalmos ENT: moist mucous membranes, no thyromegaly, no cervical lymphadenopathy Cardiovascular: RRR, No MRG Respiratory: CTA B Gastrointestinal: abdomen soft, NT, ND, BS+ Musculoskeletal: no deformities, strength intact in all 4 Skin: moist, warm, no rashes Neurological: no tremor with outstretched hands, DTR normal in all 4  ASSESSMENT: 1. DM1, uncontrolled, without long-term complications, but with hyperglycemia  2.  Hashimoto's hypothyroidism  PLAN:  1. Patient with long-standing, uncontrolled, LADA, on insulin pump and previously on Glucophage ER d.a.w. She could not obtain the brand name Glucophage ER anymore so she came off metformin altogether.   She would not want to try generic metformin ER due to previous intolerance.   - At last visit, sugars were excellent in the first half of the day with occasional spikes around dinnertime.  She has a very low basal rate before 5 PM which appears to be working well for her.  This may be the reason for slightly higher sugars in the evening so we may need to change this in the future.  We also discussed at last visit to do less manual boluses and use  more frequently the bolus wizard.  She was also not changing the infusion sets as frequently as recommended as she was having problems with the [employer] - she called them and discussed with the vice president of the company and now she is getting her supplies regularly. -At this visit, sugars were reviewed for her meter download and they have been very high 3 weeks ago, for a whole week, when she was on prednisone.  Highest blood sugar then was 329.  However, after she finished a course, for the last 2 weeks, they have been exemplary, without significant spikes or low blood sugars.  Therefore, we discussed about not skipping boluses, but otherwise, I do not feel any changes are needed in her pump regimen.  We discussed about what to do with her pump settings if she is on prednisone and I advised her that usually her postprandial sugars are higher in the situation, so I gave her lower ICRs to use while on prednisone - since Glucophage XR stopped being manufactured, we discussed about potentially switching to regular metformin extended release made by another company, but I feel that based on her sugars now she does not absolutely need to be on metformin. - I advised her to:  Patient Instructions  Please continue the following pump settings: - basal rates: 12 am: 0.800 units/h 3 am: 0.875 9:30 am: 0.100 5 pm: 1.500 6:30 pm: 1.300 11:30 pm: 1.800  - ICR:   12 am: 10 (8 if on prednisone) 6 pm: 8 (6 if on Prednisone) - target: 130-130 except 6-8 am and  6 pm-12 am: 100-100 - ISF: 35 - Insulin on Board: 4h  Please continue: - Synthroid 75 mcg daily 1 tab 6/7 days and 0.5 tabs 1/7 days  Take the thyroid hormone every day, with water, at least 30 minutes before breakfast, separated by at least 4 hours from: - acid reflux medications - calcium - iron - multivitamins  Please return in 4 months.  - today, HbA1c is 7.7% (higher), but if her sugars continue to remain as in the last 2 weeks, and the next HbA1c would be much better - continue checking sugars at different times of the day - check 4x a day, rotating checks - advised for yearly eye exams >> she is UTD - Return to clinic in 4 mo with sugar log   2.  Hashimoto's hypothyroidism - latest thyroid labs reviewed with pt >> normal 11/2017. - she continues on LT4 d.a.w. 75 mcg  1 tab 6/7 days and 0.5 tabs 1/7 days - pt feels good on this dose. - we discussed about taking the thyroid hormone every day, with water, >30 minutes before breakfast, separated by >4 hours from acid reflux medications, calcium, iron, multivitamins. Pt. is taking it correctly.  - time spent with the patient: 40 min, of which >50% was spent in reviewing her pump and glucometer downloads (will scann the reports), discussing her hypo- and hyper-glycemic episodes, reviewing previous labs and pump settings and developing a plan to avoid hypo- and hyper-glycemia.  We also addressed her hypothyroidism.  Philemon Kingdom, MD PhD [facility] Endocrinology

## 2018-02-28 NOTE — Addendum Note (Signed)
Addended by: Drucilla Schmidt on: 02/28/2018 01:00 PM   Modules accepted: Orders

## 2018-03-01 ENCOUNTER — Ambulatory Visit: Payer: Self-pay | Admitting: Pharmacist

## 2018-03-01 ENCOUNTER — Other Ambulatory Visit: Payer: Self-pay | Admitting: Pharmacist

## 2018-03-01 ENCOUNTER — Encounter: Payer: Self-pay | Admitting: Internal Medicine

## 2018-03-01 NOTE — Patient Outreach (Signed)
[facility]) Care Management  03/01/2018  Cassandra Benson [birth year] [id number]  Successful call placed to [name] regarding patient assistance for insulin.  Patient had office visit with endocrinologist yesterday however was unable to discuss this topic.  She has another office visit with PCP tomorrow morning and she will ask for advice from him at this time.  She states that she will call me next week with her decision to apply for PAP.  I provided my phone number and name again to [name].    Plan: I will await patient's call next week.  I will follow-up with her in 2 weeks if I have not heard back from her.   Ralene Bathe, PharmD, [place] [phone]

## 2018-03-02 ENCOUNTER — Ambulatory Visit (INDEPENDENT_AMBULATORY_CARE_PROVIDER_SITE_OTHER): Payer: PPO | Admitting: Family Medicine

## 2018-03-02 ENCOUNTER — Encounter: Payer: Self-pay | Admitting: Family Medicine

## 2018-03-02 VITALS — BP 138/70 | HR 81 | Temp 97.8°F | Ht 63.0 in | Wt 152.5 lb

## 2018-03-02 DIAGNOSIS — R519 Headache, unspecified: Secondary | ICD-10-CM

## 2018-03-02 DIAGNOSIS — I10 Essential (primary) hypertension: Secondary | ICD-10-CM

## 2018-03-02 DIAGNOSIS — R51 Headache: Secondary | ICD-10-CM | POA: Diagnosis not present

## 2018-03-02 DIAGNOSIS — R209 Unspecified disturbances of skin sensation: Secondary | ICD-10-CM | POA: Diagnosis not present

## 2018-03-02 DIAGNOSIS — R202 Paresthesia of skin: Secondary | ICD-10-CM

## 2018-03-02 MED ORDER — LISINOPRIL 5 MG PO TABS: 5.0000 mg | ORAL_TABLET | Freq: Two times a day (BID) | ORAL | 1 refills | Status: DC

## 2018-03-02 NOTE — Assessment & Plan Note (Signed)
Intermittent

## 2018-03-02 NOTE — Patient Instructions (Addendum)
Marked improvement in blood pressure control on current regimen.  Continue lisinopril 5mg  twice daily.  Keep appointment with neurologist.

## 2018-03-02 NOTE — Assessment & Plan Note (Addendum)
Ongoing, ESR 12/2017 was normal. Upcoming neuro appt.

## 2018-03-02 NOTE — Progress Notes (Signed)
BP 138/70 (BP Location: Right Arm, Cuff Size: Normal)   Pulse 81   Temp 97.8 F (36.6 C) (Oral)   Ht '5\' 3"'  (1.6 m)   Wt 152 lb 8 oz (69.2 kg)   SpO2 95%   BMI 27.01 kg/m    CC: 2 wk f/u HTN Subjective:    Patient ID: Cassandra Benson, female    DOB: [birth year], 77 y.o.   MRN: [id number]  HPI: KARLEI WALDO is a 77 y.o. female presenting on 03/02/2018 for Hypertension (Here for 2 wk f/u. Pt brought in home BP monitor to compare. Reading in office today, 135 71. )   See prior note for details.  Current regimen is lisinopril 34m 1/2 tab twice daily  Forgot home readings but brings cuff - history shows 115-138/67-83.  Marked improvement in blood pressure control on current regimen.   No HA, vision changes, CP/tightness, SOB, leg swelling.   Saw Dr GCruzita Lederer- working to get free Humalog through [employer]   Persistent R sided headache that starts in temple and radiates to occipital region.  Persistent intermittent R facial numbness.  No vision changes.  Upcoming appt with neurology 03/13/2018, then sees rheum in h/o SLE.  Lab Results  Component Value Date   ESRSEDRATE 18 12/25/2017     Relevant past medical, surgical, family and social history reviewed and updated as indicated. Interim medical history since our last visit reviewed. Allergies and medications reviewed and updated. Outpatient Medications Prior to Visit  Medication Sig Dispense Refill  . aspirin EC 81 MG tablet Take 81 mg by mouth daily.    . BD ULTRA-FINE LANCETS lancets Use as instructed upto 6 times daily 600 each 2  . ciprofloxacin (CILOXAN) 0.3 % ophthalmic solution Place 1 drop into the left eye every 4 (four) hours while awake. (Patient taking differently: Place 1 drop into the left eye every 4 (four) hours while awake. As needed) 5 mL 0  . co-enzyme Q-10 30 MG capsule Take 30 mg by mouth daily.    . Cyanocobalamin (B-12 SL) Place 1 drop under the tongue daily.     . diclofenac sodium (VOLTAREN) 1 % GEL  Apply 1 application topically 3 (three) times daily. 1 Tube 1  . ergocalciferol (VITAMIN D2) 50000 units capsule Take 50,000 Units by mouth as directed. Every 14 to 30 days    . glucose blood (CONTOUR NEXT TEST) test strip Use as instructed to check 4 times daily 400 each 5  . insulin aspart (NOVOLOG) 100 UNIT/ML injection Use in an insulin pump upto 50 units per day 2 vial 6  . Krill Oil 500 MG CAPS Take 2 capsules (1,000 mg total) by mouth daily.    . Lecithin 400 MG CAPS Take 1 capsule by mouth 3 (three) times a week.     . levothyroxine (SYNTHROID, LEVOTHROID) 75 MCG tablet TAKE 1 TABLET BY MOUTH ONCE DAILY FOR 6 DAYS AND 1/2 TABLETS ON THE 7TH DAY  0  . Multiple Vitamins-Minerals (PRESERVISION AREDS) TABS Take 2 tablets by mouth daily.    . nitroGLYCERIN (NITROSTAT) 0.4 MG SL tablet Place 1 tablet (0.4 mg total) under the tongue every 5 (five) minutes as needed for chest pain. 30 tablet 0  . pantoprazole (PROTONIX) 40 MG tablet Take 1 tablet (40 mg total) by mouth daily. 30 tablet 1  . rosuvastatin (CRESTOR) 5 MG tablet Take 1 tablet (5 mg total) by mouth every other day. 30 tablet 6  . lisinopril (PRINIVIL,ZESTRIL)  10 MG tablet TAKE 1 TABLET BY MOUTH ONCE DAILY (Patient taking differently: Take  tablet (5MG) by mouth every morning) 30 tablet 7   No facility-administered medications prior to visit.      Per HPI unless specifically indicated in ROS section below Review of Systems     Objective:    BP 138/70 (BP Location: Right Arm, Cuff Size: Normal)   Pulse 81   Temp 97.8 F (36.6 C) (Oral)   Ht '5\' 3"'  (1.6 m)   Wt 152 lb 8 oz (69.2 kg)   SpO2 95%   BMI 27.01 kg/m   Wt Readings from Last 3 Encounters:  03/02/18 152 lb 8 oz (69.2 kg)  02/28/18 152 lb 12.8 oz (69.3 kg)  02/16/18 152 lb 4 oz (69.1 kg)    Physical Exam  Constitutional: She appears well-developed and well-nourished. No distress.  HENT:  Mouth/Throat: Oropharynx is clear and moist. No oropharyngeal exudate.    Cardiovascular: Normal rate, regular rhythm and normal heart sounds.  No murmur heard. Pulmonary/Chest: Effort normal and breath sounds normal. No respiratory distress. She has no wheezes. She has no rales.  Musculoskeletal: She exhibits no edema.  Psychiatric: She has a normal mood and affect.  Nursing note and vitals reviewed.  Results for orders placed or performed in visit on 02/28/18  POCT glycosylated hemoglobin (Hb A1C)  Result Value Ref Range   Hemoglobin A1C 7.7 (A) 4.0 - 5.6 %   HbA1c, POC (prediabetic range)  5.7 - 6.4 %   HbA1c, POC (controlled diabetic range)  0.0 - 7.0 %      Assessment & Plan:   Problem List Items Addressed This Visit    Essential hypertension - Primary    Chronic, improved on current regimen of lisinopril 36m BID. 185mdose caused dizziness. Continue this regimen.       Relevant Medications   lisinopril (PRINIVIL,ZESTRIL) 5 MG tablet   Facial paresthesia    Intermittent.       Intractable episodic headache    Ongoing, ESR 12/2017 was normal. Upcoming neuro appt.           Meds ordered this encounter  Medications  . lisinopril (PRINIVIL,ZESTRIL) 5 MG tablet    Sig: Take 1 tablet (5 mg total) by mouth 2 (two) times daily.    Dispense:  180 tablet    Refill:  1   No orders of the defined types were placed in this encounter.   Follow up plan: No follow-ups on file.  JaRia BushMD

## 2018-03-02 NOTE — Assessment & Plan Note (Signed)
Chronic, improved on current regimen of lisinopril 5mg  BID. 10mg  dose caused dizziness. Continue this regimen.

## 2018-03-05 ENCOUNTER — Other Ambulatory Visit: Payer: Self-pay | Admitting: Pharmacist

## 2018-03-05 ENCOUNTER — Other Ambulatory Visit: Payer: Self-pay | Admitting: Pharmacy Technician

## 2018-03-05 NOTE — Patient Outreach (Signed)
[facility]) Care Management  03/05/2018  Cassandra Benson [birth year] [id number]   Incoming call and voicemail from Cassandra Benson.  Return call placed to patient.  Patient reports she has spoken with endocrinologist and PCP regarding application for PAP for insulin and both providers are agreeable for patient to apply.  Patient has already been mailed application on 0/0/9381 but would like another application sent to her.    Plan: I will route patient assistance letter to pharmacy technician, Cassandra Benson, who will coordinate application process for Humalog through [employer].  She will assist with obtaining all pertinent documents from both patient and Cassandra Benson including letter of financial hardship and submit application once completed.    Ralene Bathe, PharmD, [place] [phone]

## 2018-03-05 NOTE — Patient Outreach (Signed)
[facility]) Care Management  03/05/2018  ALANTIS BETHUNE 1940-10-23 [id number]   Unsuccessful outreach attempt #1 in regards to [employer] application and hardship letter that was mailed out to patient on 05/08 for Humalog. No voicemail  Will make 2nd call attempt in the next 2-3 business days.  Maud Deed [place], [place] Management [phone]

## 2018-03-08 ENCOUNTER — Other Ambulatory Visit: Payer: Self-pay | Admitting: Pharmacy Technician

## 2018-03-08 NOTE — Patient Outreach (Signed)
[facility]) Care Management  03/08/2018  [name] Mar 22, 1941 [id number]   Successful outreach call to patient, HIPAA identifiers verified. Patient states that she had not received [employer] patient assistance application that was mailed out on 05/08. Will prepare a new copy of application as well as a letter of hardship to be mailed out.  Will follow up with patient in the next 7 days.  Maud Deed [place], [place] Management [phone]

## 2018-03-13 ENCOUNTER — Encounter: Payer: Self-pay | Admitting: Family Medicine

## 2018-03-13 DIAGNOSIS — R209 Unspecified disturbances of skin sensation: Secondary | ICD-10-CM | POA: Diagnosis not present

## 2018-03-13 DIAGNOSIS — R51 Headache: Secondary | ICD-10-CM | POA: Diagnosis not present

## 2018-03-15 ENCOUNTER — Ambulatory Visit: Payer: PPO | Admitting: Pharmacist

## 2018-03-16 ENCOUNTER — Telehealth: Payer: Self-pay

## 2018-03-16 ENCOUNTER — Other Ambulatory Visit: Payer: Self-pay | Admitting: *Deleted

## 2018-03-16 NOTE — Telephone Encounter (Signed)
Spoke with patient. Concerns discussed.

## 2018-03-16 NOTE — Telephone Encounter (Signed)
Spoke with patient.

## 2018-03-16 NOTE — Patient Outreach (Signed)
[facility]) Care Management  03/16/2018  Cassandra Benson [birth year] [id number]  Referral from [place]: Reason: Member is requesting assistance with finding another DME company for insulin supplies. Currently receiving supplies from [facility]:  Telephone call to patient was advised of reason for call & [facility] care management services. HIPPA verification received from patient.  Patient voices that she is very upset with her insurance company because she is unable to get her insulin supplies for her insulin pump on time and has to pay for it before it is shipped to her. States current company that she uses is the one that she was given to use by her health insurance to obtain insulin pump supplies.  States she is not satisfied with supplier because she never gets her pump supplies on time. States they were suppose to arrive today but had not arrived yet.  States she did not have this trouble with her previous health insurance carrier. Voices that she knows what she needs to do and does not need  any assistance finding another company. . States she has just called another company to compare prices.  Advised patient that she could check medical supply companies in her surrounding areas as well as locally to see if they had what she needed and then check to see if they were in network with her insurance.   Patient voices that she has Type 1 Diabetes and is very comfortable managing her condition. States she does not need nurse case management services and declined assistance with community resources .    Pharmacy working on medication assistance with this patient.   Plan: Telephonic signing off. Will advise pharmacy of the above.   Sherrin Daisy, RN BSN [place] Management Coordinator [facility] Care Management  [phone]

## 2018-03-16 NOTE — Telephone Encounter (Signed)
Copied from [place] [id number]. Topic: Quick Communication - See Telephone Encounter >> Mar 16, 2018  9:15 AM Nils Flack wrote: CRM for notification. See Telephone encounter for: 03/16/18. Pt is asking for call back from provider to discuss findings from neurologist . Please call [phone]

## 2018-03-20 ENCOUNTER — Other Ambulatory Visit: Payer: Self-pay | Admitting: Pharmacy Technician

## 2018-03-20 NOTE — Patient Outreach (Signed)
[place] [facility]) Care Management  03/20/2018  Cassandra Benson 05/09/1941 [id number]  Successful outreach call to patient, HIPAA identifiers verified. Patient stated she received the [employer] application that was mailed to her and that she hopes to have it in the mail in a day or so.  Will follow up with patient in the next 14-21 days if I have not received application back.  Maud Deed [place], [place] Management [phone]

## 2018-03-22 ENCOUNTER — Telehealth: Payer: Self-pay

## 2018-03-22 NOTE — Telephone Encounter (Signed)
DME approved from 03/14/18-06/13/18 infusion sets and cartridges auth # [id number]

## 2018-03-23 DIAGNOSIS — E109 Type 1 diabetes mellitus without complications: Secondary | ICD-10-CM | POA: Diagnosis not present

## 2018-04-02 DIAGNOSIS — R209 Unspecified disturbances of skin sensation: Secondary | ICD-10-CM | POA: Diagnosis not present

## 2018-04-02 DIAGNOSIS — R51 Headache: Secondary | ICD-10-CM | POA: Diagnosis not present

## 2018-04-03 ENCOUNTER — Telehealth: Payer: Self-pay | Admitting: Family Medicine

## 2018-04-03 NOTE — Telephone Encounter (Signed)
Copied from [place] [id number]. Topic: Quick Communication - See Telephone Encounter >> Apr 03, 2018  9:52 AM Rutherford Nail, NT wrote: CRM for notification. See Telephone encounter for: 04/03/18. Patient would like a call back from Dr Darnell Level to discuss the follow up she had with the neurologist. Please advise. CB#:[phone]

## 2018-04-03 NOTE — Telephone Encounter (Signed)
I spoke with pt; pt wanted to know that pt will see Dr Richardson Landry on 04/04/18 for an introductory appt; he is an ENT specializing in Otology. The neurologist referred pt to Dr Richardson Landry. Pt is to be scheduled for biopsy of  Rt temporal artery this week. Pt concerned about the sense of urgency to have biopsy this week and wanted to talk with Dr Darnell Level.

## 2018-04-04 ENCOUNTER — Encounter: Payer: Self-pay | Admitting: *Deleted

## 2018-04-04 ENCOUNTER — Other Ambulatory Visit: Payer: Self-pay

## 2018-04-04 DIAGNOSIS — R51 Headache: Secondary | ICD-10-CM | POA: Diagnosis not present

## 2018-04-04 NOTE — Telephone Encounter (Signed)
Spoke w/pt. °

## 2018-04-05 NOTE — Discharge Instructions (Signed)
General Anesthesia, Adult, Care After °These instructions provide you with information about caring for yourself after your procedure. Your health care provider may also give you more specific instructions. Your treatment has been planned according to current medical practices, but problems sometimes occur. Call your health care provider if you have any problems or questions after your procedure. °What can I expect after the procedure? °After the procedure, it is common to have: °· Vomiting. °· A sore throat. °· Mental slowness. ° °It is common to feel: °· Nauseous. °· Cold or shivery. °· Sleepy. °· Tired. °· Sore or achy, even in parts of your body where you did not have surgery. ° °Follow these instructions at home: °For at least 24 hours after the procedure: °· Do not: °? Participate in activities where you could fall or become injured. °? Drive. °? Use heavy machinery. °? Drink alcohol. °? Take sleeping pills or medicines that cause drowsiness. °? Make important decisions or sign legal documents. °? Take care of children on your own. °· Rest. °Eating and drinking °· If you vomit, drink water, juice, or soup when you can drink without vomiting. °· Drink enough fluid to keep your urine clear or pale yellow. °· Make sure you have little or no nausea before eating solid foods. °· Follow the diet recommended by your health care provider. °General instructions °· Have a responsible adult stay with you until you are awake and alert. °· Return to your normal activities as told by your health care provider. Ask your health care provider what activities are safe for you. °· Take over-the-counter and prescription medicines only as told by your health care provider. °· If you smoke, do not smoke without supervision. °· Keep all follow-up visits as told by your health care provider. This is important. °Contact a health care provider if: °· You continue to have nausea or vomiting at home, and medicines are not helpful. °· You  cannot drink fluids or start eating again. °· You cannot urinate after 8-12 hours. °· You develop a skin rash. °· You have fever. °· You have increasing redness at the site of your procedure. °Get help right away if: °· You have difficulty breathing. °· You have chest pain. °· You have unexpected bleeding. °· You feel that you are having a life-threatening or urgent problem. °This information is not intended to replace advice given to you by your health care provider. Make sure you discuss any questions you have with your health care provider. °Document Released: 12/19/2000 Document Revised: 02/15/2016 Document Reviewed: 08/27/2015 °Elsevier Interactive Patient Education © 2018 Elsevier Inc. ° °

## 2018-04-06 ENCOUNTER — Ambulatory Visit: Payer: PPO | Admitting: Anesthesiology

## 2018-04-06 ENCOUNTER — Encounter
Admission: RE | Disposition: A | Discharge status: Home / Self Care | Payer: Self-pay | Source: Ambulatory Visit | Attending: Unknown Physician Specialty

## 2018-04-06 ENCOUNTER — Ambulatory Visit
Admission: RE | Admit: 2018-04-06 | Discharge: 2018-04-06 | Disposition: A | Discharge status: Home / Self Care | Payer: PPO | Source: Ambulatory Visit | Attending: Unknown Physician Specialty | Admitting: Unknown Physician Specialty

## 2018-04-06 DIAGNOSIS — Z79890 Hormone replacement therapy (postmenopausal): Secondary | ICD-10-CM | POA: Insufficient documentation

## 2018-04-06 DIAGNOSIS — E039 Hypothyroidism, unspecified: Secondary | ICD-10-CM | POA: Diagnosis not present

## 2018-04-06 DIAGNOSIS — Z794 Long term (current) use of insulin: Secondary | ICD-10-CM | POA: Insufficient documentation

## 2018-04-06 DIAGNOSIS — G4730 Sleep apnea, unspecified: Secondary | ICD-10-CM | POA: Diagnosis not present

## 2018-04-06 DIAGNOSIS — E1151 Type 2 diabetes mellitus with diabetic peripheral angiopathy without gangrene: Secondary | ICD-10-CM | POA: Diagnosis not present

## 2018-04-06 DIAGNOSIS — M797 Fibromyalgia: Secondary | ICD-10-CM | POA: Insufficient documentation

## 2018-04-06 DIAGNOSIS — R51 Headache: Secondary | ICD-10-CM | POA: Diagnosis not present

## 2018-04-06 DIAGNOSIS — I708 Atherosclerosis of other arteries: Secondary | ICD-10-CM | POA: Diagnosis not present

## 2018-04-06 DIAGNOSIS — I672 Cerebral atherosclerosis: Secondary | ICD-10-CM | POA: Insufficient documentation

## 2018-04-06 DIAGNOSIS — I10 Essential (primary) hypertension: Secondary | ICD-10-CM | POA: Diagnosis not present

## 2018-04-06 DIAGNOSIS — I739 Peripheral vascular disease, unspecified: Secondary | ICD-10-CM | POA: Insufficient documentation

## 2018-04-06 DIAGNOSIS — K219 Gastro-esophageal reflux disease without esophagitis: Secondary | ICD-10-CM | POA: Diagnosis not present

## 2018-04-06 DIAGNOSIS — Z9641 Presence of insulin pump (external) (internal): Secondary | ICD-10-CM | POA: Insufficient documentation

## 2018-04-06 DIAGNOSIS — I6529 Occlusion and stenosis of unspecified carotid artery: Secondary | ICD-10-CM | POA: Diagnosis not present

## 2018-04-06 HISTORY — DX: Family history of other specified conditions: Z84.89

## 2018-04-06 HISTORY — DX: Essential (primary) hypertension: I10

## 2018-04-06 HISTORY — PX: ARTERY BIOPSY: SHX891

## 2018-04-06 LAB — GLUCOSE, CAPILLARY
GLUCOSE-CAPILLARY: 117 mg/dL — AB (ref 70–99)
Glucose-Capillary: 104 mg/dL — ABNORMAL HIGH (ref 70–99)

## 2018-04-06 SURGERY — BIOPSY TEMPORAL ARTERY
Anesthesia: General | Site: Face | Laterality: Right | Wound class: "Clean "

## 2018-04-06 MED ORDER — EPHEDRINE SULFATE 50 MG/ML IJ SOLN
INTRAMUSCULAR | Status: DC | PRN
  Administered 2018-04-06 (×2): 10 mg via INTRAVENOUS

## 2018-04-06 MED ORDER — ACETAMINOPHEN 160 MG/5ML PO SOLN: 325.0000 mg | ORAL | Status: AC | PRN

## 2018-04-06 MED ORDER — PROPOFOL 500 MG/50ML IV EMUL
INTRAVENOUS | Status: DC | PRN
  Administered 2018-04-06: 140 ug/kg/min via INTRAVENOUS

## 2018-04-06 MED ORDER — MIDAZOLAM HCL 2 MG/2ML IJ SOLN
INTRAMUSCULAR | Status: DC | PRN
  Administered 2018-04-06: 2 mg via INTRAVENOUS

## 2018-04-06 MED ORDER — DOUBLE ANTIBIOTIC 500-10000 UNIT/GM EX OINT
TOPICAL_OINTMENT | CUTANEOUS | Status: DC | PRN
  Administered 2018-04-06: 1 via TOPICAL

## 2018-04-06 MED ORDER — LIDOCAINE HCL 1 % IJ SOLN
INTRAMUSCULAR | Status: DC | PRN
  Administered 2018-04-06: 2 mL
  Administered 2018-04-06: 1 mL

## 2018-04-06 MED ORDER — LACTATED RINGERS IV SOLN
INTRAVENOUS | Status: DC
  Administered 2018-04-06: 11:00:00 via INTRAVENOUS

## 2018-04-06 MED ORDER — ACETAMINOPHEN 325 MG PO TABS
650.0000 mg | ORAL_TABLET | Freq: Once | ORAL | Status: AC | PRN
  Administered 2018-04-06: 650 mg via ORAL

## 2018-04-06 MED ORDER — LIDOCAINE HCL (CARDIAC) PF 100 MG/5ML IV SOSY
PREFILLED_SYRINGE | INTRAVENOUS | Status: DC | PRN
  Administered 2018-04-06: 50 mg via INTRATRACHEAL

## 2018-04-06 MED ORDER — ONDANSETRON HCL 4 MG/2ML IJ SOLN: 4.0000 mg | Freq: Once | INTRAMUSCULAR | Status: DC | PRN

## 2018-04-06 MED ORDER — DEXMEDETOMIDINE HCL 200 MCG/2ML IV SOLN
INTRAVENOUS | Status: DC | PRN
  Administered 2018-04-06: 8 ug via INTRAVENOUS

## 2018-04-06 MED ORDER — FENTANYL CITRATE (PF) 100 MCG/2ML IJ SOLN
INTRAMUSCULAR | Status: DC | PRN
  Administered 2018-04-06 (×2): 25 ug via INTRAVENOUS

## 2018-04-06 SURGICAL SUPPLY — 22 items
CANISTER SUCT 1200ML W/VALVE (MISCELLANEOUS) ×3 IMPLANT
CLEANER CAUTERY TIP 5X5 PAD (MISCELLANEOUS) IMPLANT
CORD BIP STRL DISP 12FT (MISCELLANEOUS) ×3 IMPLANT
DERMABOND ADVANCED (GAUZE/BANDAGES/DRESSINGS)
DERMABOND ADVANCED .7 DNX12 (GAUZE/BANDAGES/DRESSINGS) IMPLANT
DRSG TEGADERM 2-3/8X2-3/4 SM (GAUZE/BANDAGES/DRESSINGS) IMPLANT
DRSG TELFA 4X3 1S NADH ST (GAUZE/BANDAGES/DRESSINGS) ×2 IMPLANT
GLOVE BIO SURGEON STRL SZ7.5 (GLOVE) ×3 IMPLANT
KIT TURNOVER KIT A (KITS) ×3 IMPLANT
NS IRRIG 500ML POUR BTL (IV SOLUTION) ×3 IMPLANT
PACK DRAPE NASAL/ENT (PACKS) ×3 IMPLANT
PAD CLEANER CAUTERY TIP 5X5 (MISCELLANEOUS) ×2
SOL PREP PVP 2OZ (MISCELLANEOUS) ×3
SOLUTION PREP PVP 2OZ (MISCELLANEOUS) ×1 IMPLANT
STRAP BODY AND KNEE 60X3 (MISCELLANEOUS) ×3 IMPLANT
SUCTION FRAZIER TIP 10 FR DISP (SUCTIONS) ×3 IMPLANT
SUT PLAIN GUT FAST 5-0 (SUTURE) ×3 IMPLANT
SUT PROLENE 4 0 PS 2 18 (SUTURE) ×2 IMPLANT
SUT SILK 3 0 (SUTURE) ×4
SUT SILK 3-0 18XBRD TIE 12 (SUTURE) ×1 IMPLANT
SUT VIC AB 4-0 FS2 27 (SUTURE) ×3 IMPLANT
SUT VIC AB 4-0 RB1 18 (SUTURE) ×2 IMPLANT

## 2018-04-06 NOTE — Anesthesia Postprocedure Evaluation (Signed)
Anesthesia Post Note  Patient: Cassandra Benson  Procedure(s) Performed: BIOPSY TEMPORAL ARTERY RIGHT (Right Face)  Patient location during evaluation: PACU Anesthesia Type: General Level of consciousness: awake and alert, oriented and patient cooperative Pain management: pain level controlled Vital Signs Assessment: post-procedure vital signs reviewed and stable Respiratory status: spontaneous breathing, nonlabored ventilation and respiratory function stable Cardiovascular status: blood pressure returned to baseline and stable Postop Assessment: adequate PO intake Anesthetic complications: no    Darrin Nipper

## 2018-04-06 NOTE — Op Note (Signed)
04/06/2018  12:55 PM    Cassandra Benson  [id number]   Pre-Op Dx: SEVERE HEADACHE  Post-op Dx: SAME  Proc: Right temporal artery biopsy  Surg:  Cassandra Benson  Anes:  GOT  EBL: Less than 5 cc  Comp: None  Findings: Normal-appearing temporal artery and adjacent vein  Procedure: Cassandra Benson was identified in the holding area taken the operating room placed in supine position.  After IV sedation the table was turned 90 degrees portion of her hair was shaved over the right temporal region.  There was then prepped and draped sterilely.  A Doppler was used to identify the temporal artery this was marked using a marking pen.  With the area prepped and draped sterilely and local anesthetic of 1% plain lidocaine was used to inject over the site.  Incision was made down to and through the subcutaneous tissues.  Hemotasis was achieved using the bipolar cautery.  A hemostat was then used to separate the tissues beneath the subcutaneous tissue.  With Doppler system the temporal artery is identified and the adjacent vein which ran superficially to the artery.  The vein was clamped on both ends and approximately 2 cm segment the vein was harvested this will be sent for specimen as well.  The divided ends were tied using 3-0 silk suture.  The temporal artery was identified again the Doppler was used and there was excellent pulsatile flow through the artery.  Careful dissection was created around the artery isolating approximately 2.2 cm segment of the vessel.  Hemostat clamps were then placed across each end.  A 15 blade was then used to incise each end of the artery was gently dissected free and sent for permanent section.  The distal ends of the artery were tied using 3-0 silk ligatures.  The wound was then copiously irrigated any identified small bleeding areas were cauterized using the microbipolar.  With no active bleeding the deep layers were closed using 4-0 Vicryl and the skin was closed using 4-0  Prolene.  Patient was returned to anesthesia where she was awakened and taken to recovery room in stable condition.  Cultures: None  Specimen: Right temporal artery and right temporal vein  Dispo:   Good  Plan: Discharged home follow-up 10 days for suture removal  Cassandra Benson  04/06/2018 12:55 PM

## 2018-04-06 NOTE — H&P (Signed)
The patient's history has been reviewed, patient examined, no change in status, stable for surgery.  Questions were answered to the patients satisfaction.  

## 2018-04-06 NOTE — Anesthesia Preprocedure Evaluation (Signed)
Anesthesia Evaluation  Patient identified by MRN, date of birth, ID band Patient awake    Reviewed: Allergy & Precautions, NPO status , Patient's Chart, lab work & pertinent test results  History of Anesthesia Complications (+) Family history of anesthesia reaction and history of anesthetic complications (brother with questionable allergy to succinylcholine; does not sound consistent with MH; pt instructed to get more information)  Airway Mallampati: I  TM Distance: >3 FB Neck ROM: Full    Dental  (+) Caps   Pulmonary sleep apnea ,    Pulmonary exam normal breath sounds clear to auscultation       Cardiovascular Exercise Tolerance: Good hypertension, + Peripheral Vascular Disease (carotid stenosis (asymptomatic)) and + DVT (in 20s)  Normal cardiovascular exam Rhythm:Regular Rate:Normal     Neuro/Psych  Headaches,    GI/Hepatic GERD  ,  Endo/Other  diabetes (insulin pump), Type 2, Insulin DependentHypothyroidism   Renal/GU      Musculoskeletal  (+) Fibromyalgia -  Abdominal   Peds  Hematology negative hematology ROS (+)   Anesthesia Other Findings Lupus   Reproductive/Obstetrics                             Anesthesia Physical Anesthesia Plan  ASA: III  Anesthesia Plan: General   Post-op Pain Management:    Induction: Intravenous  PONV Risk Score and Plan: 3 and Ondansetron, Propofol infusion and TIVA  Airway Management Planned: LMA  Additional Equipment:   Intra-op Plan:   Post-operative Plan: Extubation in OR  Informed Consent: I have reviewed the patients History and Physical, chart, labs and discussed the procedure including the risks, benefits and alternatives for the proposed anesthesia with the patient or authorized representative who has indicated his/her understanding and acceptance.     Plan Discussed with: CRNA  Anesthesia Plan Comments:          Anesthesia Quick Evaluation

## 2018-04-06 NOTE — Anesthesia Procedure Notes (Signed)
Procedure Name: MAC Date/Time: 04/06/2018 11:58 AM Performed by: Janna Arch, CRNA Pre-anesthesia Checklist: Patient identified, Emergency Drugs available, Suction available and Patient being monitored Patient Re-evaluated:Patient Re-evaluated prior to induction Oxygen Delivery Method: Nasal cannula

## 2018-04-06 NOTE — Transfer of Care (Signed)
Immediate Anesthesia Transfer of Care Note  Patient: Cassandra Benson  Procedure(s) Performed: BIOPSY TEMPORAL ARTERY RIGHT (Right Face)  Patient Location: PACU  Anesthesia Type: General  Level of Consciousness: awake, alert  and patient cooperative  Airway and Oxygen Therapy: Patient Spontanous Breathing and Patient connected to supplemental oxygen  Post-op Assessment: Post-op Vital signs reviewed, Patient's Cardiovascular Status Stable, Respiratory Function Stable, Patent Airway and No signs of Nausea or vomiting  Post-op Vital Signs: Reviewed and stable  Complications: No apparent anesthesia complications

## 2018-04-10 ENCOUNTER — Encounter: Payer: Self-pay | Admitting: Family Medicine

## 2018-04-10 LAB — SURGICAL PATHOLOGY

## 2018-04-11 ENCOUNTER — Encounter: Payer: Self-pay | Admitting: Family Medicine

## 2018-04-13 ENCOUNTER — Encounter: Payer: Self-pay | Admitting: Family Medicine

## 2018-04-13 ENCOUNTER — Telehealth: Payer: Self-pay

## 2018-04-13 NOTE — Telephone Encounter (Signed)
Replied via mychart.

## 2018-04-13 NOTE — Telephone Encounter (Signed)
Copied from [place] [id number]. Topic: General - Other >> Apr 13, 2018  8:55 AM Synthia Innocent wrote: Reason for CRM: Requesting to speak with Dr Darnell Level regarding follow up with neurologist. Would like a call back today.

## 2018-04-18 DIAGNOSIS — M329 Systemic lupus erythematosus, unspecified: Secondary | ICD-10-CM | POA: Diagnosis not present

## 2018-04-18 DIAGNOSIS — E109 Type 1 diabetes mellitus without complications: Secondary | ICD-10-CM | POA: Diagnosis not present

## 2018-04-18 DIAGNOSIS — R209 Unspecified disturbances of skin sensation: Secondary | ICD-10-CM | POA: Diagnosis not present

## 2018-04-18 DIAGNOSIS — R51 Headache: Secondary | ICD-10-CM | POA: Diagnosis not present

## 2018-04-21 ENCOUNTER — Encounter: Payer: Self-pay | Admitting: Family Medicine

## 2018-04-24 ENCOUNTER — Ambulatory Visit: Payer: PPO | Admitting: Family Medicine

## 2018-04-26 ENCOUNTER — Other Ambulatory Visit: Payer: Self-pay | Admitting: Pharmacy Technician

## 2018-04-26 NOTE — Patient Outreach (Signed)
[facility] [facility]) Care Management  04/26/2018  Cassandra Benson 1941-08-26 [id number]   Unsuccessful outreach call #1, line had busy tone.  Will make 2nd call attempt in 7-10 business days.  Maud Deed [place], [place] Management [phone]

## 2018-04-27 ENCOUNTER — Encounter: Payer: Self-pay | Admitting: Family Medicine

## 2018-04-27 DIAGNOSIS — I7090 Unspecified atherosclerosis: Secondary | ICD-10-CM | POA: Insufficient documentation

## 2018-05-01 ENCOUNTER — Ambulatory Visit (INDEPENDENT_AMBULATORY_CARE_PROVIDER_SITE_OTHER): Payer: PPO | Admitting: Family Medicine

## 2018-05-01 ENCOUNTER — Encounter

## 2018-05-01 ENCOUNTER — Encounter: Payer: Self-pay | Admitting: Family Medicine

## 2018-05-01 VITALS — BP 130/82 | HR 91 | Temp 97.9°F | Ht 63.0 in | Wt 153.5 lb

## 2018-05-01 DIAGNOSIS — M329 Systemic lupus erythematosus, unspecified: Secondary | ICD-10-CM | POA: Diagnosis not present

## 2018-05-01 DIAGNOSIS — I6522 Occlusion and stenosis of left carotid artery: Secondary | ICD-10-CM | POA: Diagnosis not present

## 2018-05-01 DIAGNOSIS — I7090 Unspecified atherosclerosis: Secondary | ICD-10-CM

## 2018-05-01 DIAGNOSIS — E782 Mixed hyperlipidemia: Secondary | ICD-10-CM | POA: Diagnosis not present

## 2018-05-01 DIAGNOSIS — I10 Essential (primary) hypertension: Secondary | ICD-10-CM | POA: Diagnosis not present

## 2018-05-01 MED ORDER — ROSUVASTATIN CALCIUM 10 MG PO TABS: 10.0000 mg | ORAL_TABLET | ORAL | 6 refills | Status: DC

## 2018-05-01 NOTE — Patient Instructions (Addendum)
See Rosaria Ferries or Azalee Course for referral to [place] vascular surgery.  Increase crestor to 10mg  every other day - new dose at pharmacy  Good to see you today.  Reschedule next week's appointment to 1 month from now.

## 2018-05-01 NOTE — Progress Notes (Signed)
BP 130/82 (BP Location: Left Arm, Patient Position: Sitting, Cuff Size: Normal)   Pulse 91   Temp 97.9 F (36.6 C) (Oral)   Ht 5\' 3"  (1.6 m)   Wt 153 lb 8 oz (69.6 kg)   SpO2 97%   BMI 27.19 kg/m    CC: discuss recent dx Subjective:    Patient ID: Cassandra Benson, female    DOB: [birth year], 77 y.o.   MRN: [id number]  HPI: Cassandra Benson is a 77 y.o. 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 Pt has questions about this. Recent ENT and neurology records reviewed.  Dr Melrose Nakayama referred him to [place] vascular surgery for carotid stenosis  Carotid US 11/5463 - LICA stenosis 68-12%, RICA without significant stenosis.  This has been regularly followed with progression noted.  She would like to go to [facility] for vascular evaluation - referral will be placed today.   Upcoming appt this week with rheum to establish for lupus.  Sister recently dx with breast cancer - papilloma.   HLD - last visit she was considering repatha/praluent - decided against this due to cost.   Relevant past medical, surgical, family and social history reviewed and updated as indicated. Interim medical history since our last visit reviewed. Allergies and medications reviewed and updated. Outpatient Medications Prior to Visit  Medication Sig Dispense Refill  . BD ULTRA-FINE LANCETS lancets Use as instructed upto 6 times daily 600 each 2  . ciprofloxacin (CILOXAN) 0.3 % ophthalmic solution Place 1 drop into the left eye every 4 (four) hours while awake. (Patient taking differently: Place 1 drop into the left eye every 4 (four) hours while awake. As needed) 5 mL 0  . co-enzyme Q-10 30 MG capsule Take 30 mg by mouth daily.    . Cyanocobalamin (B-12 SL) Place 1 drop under the tongue daily.     . diclofenac sodium (VOLTAREN) 1 % GEL Apply 1 application  topically 3 (three) times daily. 1 Tube 1  . ergocalciferol (VITAMIN D2) 50000 units capsule Take 50,000 Units by mouth as directed. Every 14 to 30 days    . glucose blood (CONTOUR NEXT TEST) test strip Use as instructed to check 4 times daily 400 each 5  . insulin aspart (NOVOLOG) 100 UNIT/ML injection Use in an insulin pump upto 50 units per day 2 vial 6  . Krill Oil 500 MG CAPS Take 2 capsules (1,000 mg total) by mouth daily.    . Lecithin 400 MG CAPS Take 1 capsule by mouth 3 (three) times a week.     . levothyroxine (SYNTHROID, LEVOTHROID) 75 MCG tablet TAKE 1 TABLET BY MOUTH ONCE DAILY FOR 6 DAYS AND 1/2 TABLETS ON THE 7TH DAY  0  . lisinopril (PRINIVIL,ZESTRIL) 5 MG tablet Take 1 tablet (5 mg total) by mouth 2 (two) times daily. 180 tablet 1  . Multiple Vitamins-Minerals (PRESERVISION AREDS) TABS Take 2 tablets by mouth daily.    . naproxen sodium (ALEVE) 220 MG tablet Take 220 mg by mouth.    . nitroGLYCERIN (NITROSTAT) 0.4 MG SL tablet Place 1 tablet (0.4 mg total) under the tongue every 5 (five) minutes as needed for chest pain. 30 tablet 0  . pantoprazole (PROTONIX) 40 MG tablet Take 1 tablet (40 mg total) by mouth daily. 30 tablet 1  . venlafaxine (EFFEXOR) 37.5 MG tablet Take 37.5 mg by  mouth 2 (two) times daily.    . rosuvastatin (CRESTOR) 5 MG tablet Take 1 tablet (5 mg total) by mouth every other day. 30 tablet 6   No facility-administered medications prior to visit.      Per HPI unless specifically indicated in ROS section below Review of Systems     Objective:    BP 130/82 (BP Location: Left Arm, Patient Position: Sitting, Cuff Size: Normal)   Pulse 91   Temp 97.9 F (36.6 C) (Oral)   Ht 5\' 3"  (1.6 m)   Wt 153 lb 8 oz (69.6 kg)   SpO2 97%   BMI 27.19 kg/m   Wt Readings from Last 3 Encounters:  05/01/18 153 lb 8 oz (69.6 kg)  04/06/18 152 lb (68.9 kg)  03/02/18 152 lb 8 oz (69.2 kg)    Physical Exam  Constitutional: She appears well-developed and  well-nourished. No distress.  Psychiatric: She has a normal mood and affect.  Nursing note and vitals reviewed.  Results for orders placed or performed during the hospital encounter of 04/06/18  Glucose, capillary  Result Value Ref Range   Glucose-Capillary 104 (H) 70 - 99 mg/dL  Glucose, capillary  Result Value Ref Range   Glucose-Capillary 117 (H) 70 - 99 mg/dL  Surgical pathology  Result Value Ref Range   SURGICAL PATHOLOGY      Surgical Pathology CASE: [id number] PATIENT: Cassandra Benson Surgical Pathology Report     SPECIMEN SUBMITTED: A. Temporal artery, right; bx B. Temporal vein, right; bx  CLINICAL HISTORY: None provided  PRE-OPERATIVE DIAGNOSIS: Severe headache  POST-OPERATIVE DIAGNOSIS: Same as pre-op     DIAGNOSIS: A.  TEMPORAL ARTERY, RIGHT; BIOPSY: - MEDIAL ARTERY CALCIFICATION (MONCKEBERG'S SCLEROSIS).  B.  TEMPORAL VEIN, RIGHT; BIOPSY: - SMALL VESSEL NEGATIVE FOR INFLAMMATION.  Note: The temporal artery is negative for features of giant cell arteritis.  GROSS DESCRIPTION: A. Labeled: Right temporal artery biopsy Received: In formalin Size: 1.2 x 0.1 x 0.1 cm Other findings: tan to brown luminal fragment  Block summary: 1 - submitted intact for histologic processing  B. Labeled: Temporal vein right biopsy Received: In formalin Size: 0.3 x 0.1 by less than 0.1 cm Other findings: Tan to brown possible luminal fragment  Block summary: 1 - entirely sub mitted intact for histologic processing    Final Diagnosis performed by Delorse Lek, MD.   Electronically signed 04/10/2018 12:36:03PM The electronic signature indicates that the named Attending Pathologist has evaluated the specimen  Technical component performed at [facility], [address], [place], [place] [address] Lab: [phone] Dir: Rush Farmer, MD, MMM  Professional component performed at [facility], [facility], [place], [place], [place] [address]  Lab: [phone] Dir: Dellia Nims. Rubinas, MD    Lab Results  Component Value Date   CHOL 251 (H) 08/11/2017   HDL 40.00 08/11/2017   LDLCALC 179 (H) 08/11/2017   LDLDIRECT 177.0 08/09/2016   TRIG 159.0 (H) 08/11/2017   CHOLHDL 6 08/11/2017       Assessment & Plan:   Problem List Items Addressed This Visit    SLE (systemic lupus erythematosus) ([place])    Upcoming appt later this week to establish with rheumatology (Dr Trudie Reed).       Monckeberg's medial sclerosis - Primary    Discussed pathophysiology of this disorder - calcification of media layer of arteries leading to increased stiffness and decreased elasticity. Reviewed management to include good blood pressure, cholesterol, sugar control. Pt has been recommended vascular evaluation and requests referral  to [place] - will place referral today.       Relevant Medications   rosuvastatin (CRESTOR) 10 MG tablet   Other Relevant Orders   Ambulatory referral to Vascular Surgery   Hyperlipidemia    LDL levels not at goal. PCSK9 inhibitors unaffordable.  Will trial increased crestor dose to 10mg  QOD. Update FLP next labwork. Pt agrees with plan.       Relevant Medications   rosuvastatin (CRESTOR) 10 MG tablet   Essential hypertension    Chronic, stable. Continue current regimen.      Relevant Medications   rosuvastatin (CRESTOR) 10 MG tablet   Carotid stenosis, asymptomatic    Referral placed to local [facility] in [place] per pt preference      Relevant Medications   rosuvastatin (CRESTOR) 10 MG tablet   Other Relevant Orders   Ambulatory referral to Vascular Surgery       Meds ordered this encounter  Medications  . rosuvastatin (CRESTOR) 10 MG tablet    Sig: Take 1 tablet (10 mg total) by mouth every other day.    Dispense:  30 tablet    Refill:  6    Use this sig   Orders Placed This Encounter  Procedures  . Ambulatory referral to Vascular Surgery    Referral Priority:   Routine    Referral Type:   Surgical    Referral  Reason:   Specialty Services Required    Requested Specialty:   Vascular Surgery    Number of Visits Requested:   1    Follow up plan: Return in about 1 month (around 05/29/2018) for follow up visit.  [name], MD

## 2018-05-04 DIAGNOSIS — M150 Primary generalized (osteo)arthritis: Secondary | ICD-10-CM | POA: Diagnosis not present

## 2018-05-04 DIAGNOSIS — R5383 Other fatigue: Secondary | ICD-10-CM | POA: Diagnosis not present

## 2018-05-04 DIAGNOSIS — R768 Other specified abnormal immunological findings in serum: Secondary | ICD-10-CM | POA: Diagnosis not present

## 2018-05-04 DIAGNOSIS — I7300 Raynaud's syndrome without gangrene: Secondary | ICD-10-CM | POA: Diagnosis not present

## 2018-05-04 DIAGNOSIS — R51 Headache: Secondary | ICD-10-CM | POA: Diagnosis not present

## 2018-05-04 DIAGNOSIS — M797 Fibromyalgia: Secondary | ICD-10-CM | POA: Diagnosis not present

## 2018-05-04 NOTE — Assessment & Plan Note (Signed)
Upcoming appt later this week to establish with rheumatology (Dr Trudie Reed).

## 2018-05-04 NOTE — Assessment & Plan Note (Addendum)
LDL levels not at goal. PCSK9 inhibitors unaffordable.  Will trial increased crestor dose to 10mg  QOD. Update FLP next labwork. Pt agrees with plan.

## 2018-05-04 NOTE — Assessment & Plan Note (Signed)
Referral placed to local [facility]VS in [place] per pt preference

## 2018-05-04 NOTE — Assessment & Plan Note (Signed)
Chronic, stable. Continue current regimen. 

## 2018-05-04 NOTE — Assessment & Plan Note (Signed)
Discussed pathophysiology of this disorder - calcification of media layer of arteries leading to increased stiffness and decreased elasticity. Reviewed management to include good blood pressure, cholesterol, sugar control. Pt has been recommended vascular evaluation and requests referral to [place] - will place referral today.

## 2018-05-07 ENCOUNTER — Ambulatory Visit: Payer: PPO | Admitting: Family Medicine

## 2018-05-09 ENCOUNTER — Other Ambulatory Visit: Payer: Self-pay | Admitting: Pharmacist

## 2018-05-09 NOTE — Patient Outreach (Signed)
[facility] [facility]) Care Management  05/09/2018  Cassandra Benson 27-Feb-1941 [id number]   Outpatient call attempt to [name] regarding medication assistance.  Patient reports she is not going to return applications because she is over the income limit after looking at her financial documents.  She has my phone number and will contact me if she has any other medication related questions in the future.   Plan: I will close patient case at this time.   Ralene Bathe, PharmD, [place] [phone]

## 2018-05-21 ENCOUNTER — Encounter (INDEPENDENT_AMBULATORY_CARE_PROVIDER_SITE_OTHER): Payer: Self-pay | Admitting: Vascular Surgery

## 2018-05-21 ENCOUNTER — Ambulatory Visit (INDEPENDENT_AMBULATORY_CARE_PROVIDER_SITE_OTHER): Payer: PPO | Admitting: Vascular Surgery

## 2018-05-21 VITALS — BP 136/78 | HR 79 | Resp 13 | Ht 63.5 in | Wt 155.0 lb

## 2018-05-21 DIAGNOSIS — I10 Essential (primary) hypertension: Secondary | ICD-10-CM | POA: Diagnosis not present

## 2018-05-21 DIAGNOSIS — I6522 Occlusion and stenosis of left carotid artery: Secondary | ICD-10-CM | POA: Diagnosis not present

## 2018-05-21 DIAGNOSIS — G459 Transient cerebral ischemic attack, unspecified: Secondary | ICD-10-CM

## 2018-05-21 DIAGNOSIS — R51 Headache: Secondary | ICD-10-CM

## 2018-05-21 DIAGNOSIS — G8929 Other chronic pain: Secondary | ICD-10-CM

## 2018-05-21 NOTE — Progress Notes (Signed)
MRN : [id number]  Cassandra Benson is a 77 y.o. (1940/12/19) female who presents with chief complaint of  Chief Complaint  Patient presents with  . New Patient (Initial Visit)    Carotid Artert Stenosis  .  History of Present Illness:   The patient is seen for evaluation of multiple episodes of right facial numbness 9the most recent just last week) associated with intractable headache. The patient describes it as a chronic pain.  It lasted on the order of minutes and resolved completely.  There was no loss of consciousness.  There have been two or three prior episodes over the past several months.  There is no recent history of arm or leg TIA symptoms or focal motor deficits. There is no prior documented CVA.  The patient was not taking enteric-coated aspirin 81 mg daily at the time.  There is no history of migraine headaches or prior diagnosis of ocular migraine. There is no history of seizures.  The patient has a history of coronary artery disease, no recent episodes of angina or shortness of breath. The patient denies PAD or claudication symptoms. There is a history of hyperlipidemia which is being treated with a statin.    No outpatient medications have been marked as taking for the 05/21/18 encounter (Office Visit) with Delana Meyer, Dolores Lory, MD.    Past Medical History:  Diagnosis Date  . Allergy   . Arthritis   . Carotid stenosis, asymptomatic 06/19/2015   8-50% RICA 27-74% LICA rpt 1 yr (09/2876)   . Diabetes mellitus without complication ([place])   . Family history of adverse reaction to anesthesia    brothr went into cardiac arrest from anectine  . Fibromyalgia    prior PCP  . GERD (gastroesophageal reflux disease)    prior PCP  . Glaucoma    Narrow angle  . History of blood clots    DVT, in 20s, none since  . History of chicken pox   . History of diverticulitis   . History of pericarditis 1986   with hospitalization  . History of pneumonia 2014  . History of  shingles   . History of UTI   . Hyperlipidemia   . Hypertension   . Hypothyroidism   . Lupus (systemic lupus erythematosus) ([place])   . Shoulder pain left   h/o RTC tendonitis and adhesive capsulitis  . Sleep apnea    prior PCP - no CPAP for about 10 yrs  . Vitamin D deficiency    prior PCP    Past Surgical History:  Procedure Laterality Date  . ABDOMINAL HYSTERECTOMY  1978   fibroids and menorrhagia, ovaries remain  . ARTERY BIOPSY Right 04/06/2018   Procedure: BIOPSY TEMPORAL ARTERY RIGHT;  Surgeon: Beverly Gust, MD;  Location: [facility];  Service: ENT;  Laterality: Right;  Diabetic - insulin pump sleep apnea  . [facility] normal per patient  . COLONOSCOPY WITH ESOPHAGOGASTRODUODENOSCOPY (EGD)  03/2007   2 ulcers, benign polyp, rpt 5 yrs ([place], [place])  . PARTIAL HIP ARTHROPLASTY  2013   Right hip replacement  . TONSILLECTOMY AND ADENOIDECTOMY    . VAGINAL DELIVERY     x2, no complications    Social History Social History   Tobacco Use  . Smoking status: Never Smoker  . Smokeless tobacco: Never Used  Substance Use Topics  . Alcohol use: No  . Drug use: No    Family History Family History  Problem  Relation Age of Onset  . CAD Mother 28       MI, aortic valve issues  . COPD Mother   . Lupus Mother   . Graves' disease Mother   . Rheum arthritis Mother   . CAD Father 78       CABG x2, aortic valve replacement  . Stroke Sister   . Alcohol abuse Brother   . CAD Brother 22       MI  . Stroke Brother   . Seizures Son   . COPD Brother   . CAD Brother 87       stent  . Diabetes Brother   . Depression Grandchild   . Cancer Maternal Aunt        breast  . Breast cancer Maternal Aunt   . Diabetes Sister        deceased  . CAD Sister 42       stents  . Breast cancer Maternal Aunt   No family history of bleeding/clotting disorders, porphyria or autoimmune disease   Allergies  Allergen Reactions  .  Iodinated Diagnostic Agents Other (See Comments)    Itching (severe) and chest tightness  . Penicillins Anaphylaxis, Swelling and Rash    Has patient had a PCN reaction causing immediate rash, facial/tongue/throat swelling, SOB or lightheadedness with hypotension: Yes Has patient had a PCN reaction causing severe rash involving mucus membranes or skin necrosis: Unknown Has patient had a PCN reaction that required hospitalization: Unknown Has patient had a PCN reaction occurring within the last 10 years: No If all of the above answers are "NO", then may proceed with Cephalosporin use.   Marland Kitchen Anectine [Succinylcholine]     Brother went into cardiac arrest.  . Codeine Nausea Only  . Gabapentin     Gait abnormality  . Influenza Vaccines     Muscle weakness; unable to walk  . Nortriptyline     Eye swelling and mouth drawed up  . Pamelor [Nortriptyline Hcl]     Patient states caused her face to draw in together.  . Valsartan     Allergy to generic only Other reaction(s): Cough "Hacking" cough  . Erythromycin Rash and Swelling  . Sulfa Antibiotics Rash     REVIEW OF SYSTEMS (Negative unless checked)  Constitutional: [] Weight loss  [] Fever  [] Chills Cardiac: [] Chest pain   [] Chest pressure   [] Palpitations   [] Shortness of breath when laying flat   [] Shortness of breath with exertion. Vascular:  [] Pain in legs with walking   [] Pain in legs at rest  [] History of DVT   [] Phlebitis   [] Swelling in legs   [] Varicose veins   [] Non-healing ulcers Pulmonary:   [] Uses home oxygen   [] Productive cough   [] Hemoptysis   [] Wheeze  [] COPD   [] Asthma Neurologic:  [] Dizziness   [] Seizures   [] History of stroke   [] History of TIA  [] Aphasia   [] Vissual changes   [] Weakness or numbness in arm   [] Weakness or numbness in leg Musculoskeletal:   [] Joint swelling   [] Joint pain   [] Low back pain Hematologic:  [] Easy bruising  [] Easy bleeding   [] Hypercoagulable state   [] Anemic Gastrointestinal:  [] Diarrhea    [] Vomiting  [] Gastroesophageal reflux/heartburn   [] Difficulty swallowing. Genitourinary:  [] Chronic kidney disease   [] Difficult urination  [] Frequent urination   [] Blood in urine Skin:  [] Rashes   [] Ulcers  Psychological:  [] History of anxiety   []  History of major depression.  Physical Examination  Vitals:  05/21/18 1006  BP: 136/78  Pulse: 79  Resp: 13  Weight: 155 lb (70.3 kg)  Height: 5' 3.5" (1.613 m)   Body mass index is 27.03 kg/m. Gen: WD/WN, NAD Head: [place]/AT, No temporalis wasting.  Ear/Nose/Throat: Hearing grossly intact, nares w/o erythema or drainage, poor dentition Eyes: PER, EOMI, sclera nonicteric.  Neck: Supple, no masses.  No bruit or JVD.  Pulmonary:  Good air movement, clear to auscultation bilaterally, no use of accessory muscles.  Cardiac: RRR, normal S1, S2, no Murmurs. Vascular: harsh left carotid bruit, right temp artery biopsy incision well healed Vessel Right Left  Radial Palpable Palpable  Brachial Palpable Palpable  Carotid Palpable Palpable  Gastrointestinal: soft, non-distended. No guarding/no peritoneal signs.  Musculoskeletal: M/S 5/5 throughout.  No deformity or atrophy.  Neurologic: CN 2-12 intact. Pain and light touch intact in extremities.  Symmetrical.  Speech is fluent. Motor exam as listed above. Psychiatric: Judgment intact, Mood & affect appropriate for pt's clinical situation. Dermatologic: No rashes or ulcers noted.  No changes consistent with cellulitis. Lymph : No Cervical lymphadenopathy, no lichenification or skin changes of chronic lymphedema.  CBC Lab Results  Component Value Date   WBC 8.0 01/30/2018   HGB 14.1 01/30/2018   HCT 42.8 01/30/2018   MCV 80.1 01/30/2018   PLT 250 01/30/2018    BMET    Component Value Date/Time   NA 136 01/30/2018 1718   NA 141 01/11/2016   K 4.2 01/30/2018 1718   K 4.2 01/11/2016   CL 101 01/30/2018 1718   CO2 25 01/30/2018 1718   GLUCOSE 157 (H) 01/30/2018 1718   BUN 17 01/30/2018  1718   CREATININE 0.84 01/30/2018 1718   CREATININE 0.89 01/11/2016   CALCIUM 9.8 01/30/2018 1718   GFRNONAA >60 01/30/2018 1718   GFRAA >60 01/30/2018 1718   CrCl cannot be calculated (Patient's most recent lab result is older than the maximum 21 days allowed.).  COAG No results found for: INR, PROTIME  Radiology No results found.   Assessment/Plan 1. TIA (transient ischemic attack) The patient is likely symptomatic with respect to the carotid stenosis.  The patient has a lesion that is >70%.  Patient should undergo CT angiography of the carotid arteries to define the degree of stenosis of the internal carotid arteries bilaterally and the anatomic suitability for surgery vs. intervention.  If the patient does indeed need surgery cardiac clearance will be required, once cleared the patient will be scheduled for surgery.  The risks, benefits and alternative therapies were reviewed in detail with the patient.  All questions were answered.  The patient agrees to proceed with imaging.  Continue antiplatelet therapy as prescribed. Continue management of CAD, HTN and Hyperlipidemia. Healthy heart diet, encouraged exercise at least 4 times per week.    A total of 70 minutes was spent with this patient and greater than 50% was spent in counseling and coordination of care with the patient.  Discussion included the treatment options for vascular disease including indications for surgery and intervention.  Also discussed is the appropriate timing of treatment.  In addition medical therapy was discussed.  - CT ANGIO NECK W OR WO CONTRAST; Future  2. Symptomatic stenosis of left carotid artery without infarction See #1 - CT ANGIO NECK W OR WO CONTRAST; Future  3. Essential hypertension Continue antihypertensive medications as already ordered, these medications have been reviewed and there are no changes at this time.   4. Chronic intractable headache, unspecified headache type See #1 -  CT ANGIO HEAD W OR WO CONTRAST; Future   Hortencia Pilar, MD  05/21/2018 10:07 AM

## 2018-05-26 ENCOUNTER — Other Ambulatory Visit: Payer: Self-pay | Admitting: Internal Medicine

## 2018-06-01 ENCOUNTER — Telehealth (INDEPENDENT_AMBULATORY_CARE_PROVIDER_SITE_OTHER): Payer: Self-pay

## 2018-06-01 ENCOUNTER — Ambulatory Visit (INDEPENDENT_AMBULATORY_CARE_PROVIDER_SITE_OTHER): Payer: PPO | Admitting: Family Medicine

## 2018-06-01 ENCOUNTER — Encounter: Payer: Self-pay | Admitting: Family Medicine

## 2018-06-01 VITALS — BP 128/64 | HR 67 | Temp 97.6°F | Ht 63.0 in | Wt 153.2 lb

## 2018-06-01 DIAGNOSIS — I6522 Occlusion and stenosis of left carotid artery: Secondary | ICD-10-CM | POA: Diagnosis not present

## 2018-06-01 DIAGNOSIS — R51 Headache: Secondary | ICD-10-CM

## 2018-06-01 DIAGNOSIS — E782 Mixed hyperlipidemia: Secondary | ICD-10-CM

## 2018-06-01 DIAGNOSIS — R209 Unspecified disturbances of skin sensation: Secondary | ICD-10-CM

## 2018-06-01 DIAGNOSIS — R519 Headache, unspecified: Secondary | ICD-10-CM

## 2018-06-01 DIAGNOSIS — G459 Transient cerebral ischemic attack, unspecified: Secondary | ICD-10-CM

## 2018-06-01 DIAGNOSIS — R202 Paresthesia of skin: Secondary | ICD-10-CM

## 2018-06-01 DIAGNOSIS — M329 Systemic lupus erythematosus, unspecified: Secondary | ICD-10-CM | POA: Diagnosis not present

## 2018-06-01 DIAGNOSIS — I7090 Unspecified atherosclerosis: Secondary | ICD-10-CM

## 2018-06-01 DIAGNOSIS — M797 Fibromyalgia: Secondary | ICD-10-CM | POA: Diagnosis not present

## 2018-06-01 MED ORDER — TRAMADOL HCL 50 MG PO TABS: 25.0000 mg | ORAL_TABLET | Freq: Two times a day (BID) | ORAL | 0 refills | Status: DC | PRN

## 2018-06-01 NOTE — Patient Instructions (Addendum)
May take tramadol for breakthrough pain.  Take effexor nightly.  We will refer you to [facility] vascular surgeons.  Good to see you today.

## 2018-06-01 NOTE — Telephone Encounter (Signed)
I called in the prednisone 50mg  #3 tabs,benadryl 25mg  #2 tabs,zantac 150mg  #1 tab into [facility] for contrast dye allergy

## 2018-06-01 NOTE — Progress Notes (Signed)
BP 128/64 (BP Location: Left Arm, Patient Position: Sitting, Cuff Size: Normal)   Pulse 67   Temp 97.6 F (36.4 C) (Oral)   Ht 5\' 3"  (1.6 m)   Wt 153 lb 3 oz (69.5 kg)   SpO2 98%   BMI 27.14 kg/m    CC: 1 mo f/u visit Subjective:    Patient ID: [name], female    DOB: [birth year], 77 y.o.   MRN: [id number]  HPI: MARTINE BLEECKER is a 77 y.o. female presenting on 06/01/2018 for 1 mo follow up   Ongoing right sided headache and facial numbness, ongoing pain at R temporal artery biopsy scar. Aleve helps some.   Saw rheum Trudie Reed), note reviewed. 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 Lupus confirmatory labs returned negative. She was treated for mild UTI with macrobid - she was asymptomatic.   Saw [facility] (Schneir), note reviewed. Concern for TIA as cause of recurrent facial numbness with headache - planned CTA carotids and consideration for surgical intervention. She has iodine allergy. If surgery required, does not want to have at [facility] - would want to go to [facility].   Has seen neurology Melrose Nakayama).  HLD - last month we increased crestor to 10mg  QOD. Tolerating this well. PCSK9 inhibitors unaffordable.   Relevant past medical, surgical, family and social history reviewed and updated as indicated. Interim medical history since our last visit reviewed. Allergies and medications reviewed and updated. Outpatient Medications Prior to Visit  Medication Sig Dispense Refill  . aspirin EC 81 MG tablet Take 81 mg by mouth daily.    . BD ULTRA-FINE LANCETS lancets Use as instructed upto 6 times daily 600 each 2  . ciprofloxacin (CILOXAN) 0.3 % ophthalmic solution Place 1 drop into the left eye every 4 (four) hours while awake. (Patient taking differently: Place 1 drop into the left eye every 4 (four) hours while awake. As needed) 5 mL 0  . co-enzyme Q-10 30 MG capsule Take 30 mg by mouth daily.    . Cyanocobalamin (B-12 SL) Place 1 drop under the tongue daily.     . diclofenac sodium  (VOLTAREN) 1 % GEL Apply 1 application topically 3 (three) times daily. 1 Tube 1  . ergocalciferol (VITAMIN D2) 50000 units capsule Take 50,000 Units by mouth as directed. Every 14 to 30 days    . glucose blood (CONTOUR NEXT TEST) test strip Use as instructed to check 4 times daily 400 each 5  . insulin aspart (NOVOLOG) 100 UNIT/ML injection Use in an insulin pump upto 50 units per day 2 vial 6  . Krill Oil 500 MG CAPS Take 2 capsules (1,000 mg total) by mouth daily.    . Lecithin 400 MG CAPS Take 1 capsule by mouth 3 (three) times a week.     . levothyroxine (SYNTHROID, LEVOTHROID) 75 MCG tablet TAKE 1 TABLET BY MOUTH ONCE DAILY FOR 6 DAYS AND 1/2 TABLETS ON THE 7TH DAY  0  . lisinopril (PRINIVIL,ZESTRIL) 5 MG tablet Take 1 tablet (5 mg total) by mouth 2 (two) times daily. 180 tablet 1  . Multiple Vitamins-Minerals (PRESERVISION AREDS) TABS Take 2 tablets by mouth daily.    . naproxen sodium (ALEVE) 220 MG tablet Take 220 mg by mouth.    . nitroGLYCERIN (NITROSTAT) 0.4 MG SL tablet Place 1 tablet (0.4 mg total) under the tongue every 5 (five) minutes as needed for chest pain. 30 tablet 0  . pantoprazole (PROTONIX) 40 MG tablet Take 1 tablet (  40 mg total) by mouth daily. 30 tablet 1  . rosuvastatin (CRESTOR) 10 MG tablet Take 1 tablet (10 mg total) by mouth every other day. 30 tablet 6  . SYNTHROID 75 MCG tablet TAKE 1 TABLET BY MOUTH ONCE DAILY FOR 6 DAYS AND 1 AND 1/2 TABLETS ON THE 7TH DAY 90 tablet 0  . venlafaxine (EFFEXOR) 37.5 MG tablet Take 1 tablet (37.5 mg total) by mouth at bedtime.    Marland Kitchen venlafaxine (EFFEXOR) 37.5 MG tablet Take 37.5 mg by mouth 2 (two) times daily.     No facility-administered medications prior to visit.      Per HPI unless specifically indicated in ROS section below Review of Systems     Objective:    BP 128/64 (BP Location: Left Arm, Patient Position: Sitting, Cuff Size: Normal)   Pulse 67   Temp 97.6 F (36.4 C) (Oral)   Ht 5\' 3"  (1.6 m)   Wt 153 lb 3 oz  (69.5 kg)   SpO2 98%   BMI 27.14 kg/m   Wt Readings from Last 3 Encounters:  06/01/18 153 lb 3 oz (69.5 kg)  05/21/18 155 lb (70.3 kg)  05/01/18 153 lb 8 oz (69.6 kg)    Physical Exam  Constitutional: She appears well-developed and well-nourished. No distress.  HENT:  Head:    Mouth/Throat: Oropharynx is clear and moist. No oropharyngeal exudate.  Small scab along R temporal artery biopsy site, incision otherwise healed well. Tender to touch around incision.  Neck: Carotid bruit is present (R sided).  Cardiovascular: Normal rate, regular rhythm and normal heart sounds.  No murmur heard. Pulmonary/Chest: Effort normal and breath sounds normal. No respiratory distress. She has no wheezes. She has no rales.  Musculoskeletal: She exhibits no edema.  Psychiatric: She has a normal mood and affect.  Nursing note and vitals reviewed.  Lab Results  Component Value Date   CHOL 251 (H) 08/11/2017   HDL 40.00 08/11/2017   LDLCALC 179 (H) 08/11/2017   LDLDIRECT 177.0 08/09/2016   TRIG 159.0 (H) 08/11/2017   CHOLHDL 6 08/11/2017    Lab Results  Component Value Date   CREATININE 0.84 01/30/2018   BUN 17 01/30/2018   NA 136 01/30/2018   K 4.2 01/30/2018   CL 101 01/30/2018   CO2 25 01/30/2018      Assessment & Plan:   Problem List Items Addressed This Visit    TIA (transient ischemic attack)    She states she has been regularly taking aspirin 81mg  daily - added to medication list.       Relevant Medications   aspirin EC 81 MG tablet   SLE (systemic lupus erythematosus) ([place])    Likely doesn't have this diagnosis - s/p rheum eval 04/2018 Trudie Reed)      Monckeberg's medial sclerosis    Recently saw Dr Ronalee Belts who recommended CTA but pt desires to seek further care in [place] - see below.       Relevant Medications   aspirin EC 81 MG tablet   Other Relevant Orders   Ambulatory referral to Vascular Surgery   Intractable episodic headache    Ongoing. Effexor has been helpful  (started by neurology) but she hasn't been taking regularly. Advised start taking every night, update with effect. If breakthrough symptoms despite effexor, discussed sparing tramadol use (1/2 tab at a time).       Relevant Medications   aspirin EC 81 MG tablet   venlafaxine (EFFEXOR) 37.5 MG tablet   traMADol (  ULTRAM) 50 MG tablet   Hyperlipidemia    Tolerating crestor QOD. Will need updated labs - I will ask her to return for this.       Relevant Medications   aspirin EC 81 MG tablet   Fibromyalgia    Appreciate recent rheum eval      Facial paresthesia    S/p vascular eval - ?intermittent TIA related      Carotid stenosis, symptomatic w/o infarct - Primary    She saw Dr Ronalee Belts in [place] who recommended neck CTA for further guidance on possible carotid stenosis intervention - concern that headache and facial paresthesia symptoms are coming from this. She appreciated his care and recommendations. However if surgery is required she is pretty set about having this at [facility] in [place] - doesn't want to have any surgical intervention at [facility]. For this reason we will refer her to establish care with [facility].       Relevant Medications   aspirin EC 81 MG tablet   Other Relevant Orders   Ambulatory referral to Vascular Surgery       Meds ordered this encounter  Medications  . traMADol (ULTRAM) 50 MG tablet    Sig: Take 0.5-1 tablets (25-50 mg total) by mouth 2 (two) times daily as needed for moderate pain.    Dispense:  30 tablet    Refill:  0   Orders Placed This Encounter  Procedures  . Ambulatory referral to Vascular Surgery    Referral Priority:   Routine    Referral Type:   Surgical    Referral Reason:   Specialty Services Required    Requested Specialty:   Vascular Surgery    Number of Visits Requested:   1    Follow up plan: No follow-ups on file.  [name], MD

## 2018-06-02 ENCOUNTER — Telehealth: Payer: Self-pay | Admitting: Family Medicine

## 2018-06-02 DIAGNOSIS — E139 Other specified diabetes mellitus without complications: Secondary | ICD-10-CM

## 2018-06-02 DIAGNOSIS — E782 Mixed hyperlipidemia: Secondary | ICD-10-CM

## 2018-06-02 NOTE — Assessment & Plan Note (Addendum)
Ongoing. Effexor has been helpful (started by neurology) but she hasn't been taking regularly. Advised start taking every night, update with effect. If breakthrough symptoms despite effexor, discussed sparing tramadol use (1/2 tab at a time).

## 2018-06-02 NOTE — Assessment & Plan Note (Addendum)
Tolerating crestor QOD. Will need updated labs - I will ask her to return for this.

## 2018-06-02 NOTE — Assessment & Plan Note (Addendum)
Appreciate recent rheum eval

## 2018-06-02 NOTE — Assessment & Plan Note (Signed)
Likely doesn't have this diagnosis - s/p rheum eval 04/2018 Trudie Reed)

## 2018-06-02 NOTE — Assessment & Plan Note (Signed)
S/p vascular eval - ?intermittent TIA related

## 2018-06-02 NOTE — Assessment & Plan Note (Signed)
Recently saw Dr Ronalee Belts who recommended CTA but pt desires to seek further care in [place] - see below.

## 2018-06-02 NOTE — Assessment & Plan Note (Signed)
She states she has been regularly taking aspirin 81mg  daily - added to medication list.

## 2018-06-02 NOTE — Telephone Encounter (Signed)
plz call - I forgot to discuss at [place], but I'd like her to come in for fasting labs to check chol levels. Labs ordered. Thanks.

## 2018-06-02 NOTE — Assessment & Plan Note (Signed)
She saw Dr Ronalee Belts in [place] who recommended neck CTA for further guidance on possible carotid stenosis intervention - concern that headache and facial paresthesia symptoms are coming from this. She appreciated his care and recommendations. However if surgery is required she is pretty set about having this at [facility] in [place] - doesn't want to have any surgical intervention at [facility]. For this reason we will refer her to establish care with [facility].

## 2018-06-02 NOTE — Addendum Note (Signed)
Addended by: [name] on: 06/02/2018 12:03 PM   Modules accepted: Orders

## 2018-06-05 NOTE — Telephone Encounter (Signed)
Spoke with pt relaying Dr. Synthia Innocent message.  Pt verbalizes understanding but states she is not at home to look at schedule. So she will call back to schedule fasting lab visit for sometime next week.

## 2018-06-07 ENCOUNTER — Ambulatory Visit: Admission: RE | Admit: 2018-06-07 | Payer: PPO | Source: Ambulatory Visit

## 2018-06-07 ENCOUNTER — Ambulatory Visit
Admission: RE | Admit: 2018-06-07 | Discharge: 2018-06-07 | Disposition: A | Discharge status: Home / Self Care | Payer: PPO | Source: Ambulatory Visit | Attending: Vascular Surgery | Admitting: Vascular Surgery

## 2018-06-07 DIAGNOSIS — G459 Transient cerebral ischemic attack, unspecified: Secondary | ICD-10-CM | POA: Diagnosis present

## 2018-06-07 DIAGNOSIS — R51 Headache: Secondary | ICD-10-CM

## 2018-06-07 DIAGNOSIS — R519 Headache, unspecified: Secondary | ICD-10-CM

## 2018-06-07 DIAGNOSIS — I6522 Occlusion and stenosis of left carotid artery: Secondary | ICD-10-CM | POA: Insufficient documentation

## 2018-06-07 LAB — POCT I-STAT CREATININE: Creatinine, Ser: 0.8 mg/dL (ref 0.44–1.00)

## 2018-06-07 MED ORDER — IOHEXOL 350 MG/ML SOLN
75.0000 mL | Freq: Once | INTRAVENOUS | Status: AC | PRN
  Administered 2018-06-07: 75 mL via INTRAVENOUS

## 2018-06-11 ENCOUNTER — Other Ambulatory Visit (INDEPENDENT_AMBULATORY_CARE_PROVIDER_SITE_OTHER): Payer: PPO

## 2018-06-11 DIAGNOSIS — E139 Other specified diabetes mellitus without complications: Secondary | ICD-10-CM

## 2018-06-11 DIAGNOSIS — E782 Mixed hyperlipidemia: Secondary | ICD-10-CM

## 2018-06-11 LAB — LIPID PANEL
CHOL/HDL RATIO: 5
Cholesterol: 214 mg/dL — ABNORMAL HIGH (ref 0–200)
HDL: 43.6 mg/dL (ref >39.00)
LDL Cholesterol: 147 mg/dL — ABNORMAL HIGH (ref 0–99)
NONHDL: 170.79
Triglycerides: 118 mg/dL (ref 0.0–149.0)
VLDL: 23.6 mg/dL (ref 0.0–40.0)

## 2018-06-11 LAB — HEMOGLOBIN A1C: HEMOGLOBIN A1C: 8.1 % — AB (ref 4.6–6.5)

## 2018-06-13 ENCOUNTER — Encounter: Payer: Self-pay | Admitting: Family Medicine

## 2018-06-14 ENCOUNTER — Encounter: Payer: Self-pay | Admitting: Family Medicine

## 2018-06-18 ENCOUNTER — Ambulatory Visit (INDEPENDENT_AMBULATORY_CARE_PROVIDER_SITE_OTHER): Payer: PPO | Admitting: Vascular Surgery

## 2018-06-18 ENCOUNTER — Encounter (INDEPENDENT_AMBULATORY_CARE_PROVIDER_SITE_OTHER): Payer: Self-pay | Admitting: Vascular Surgery

## 2018-06-18 VITALS — BP 147/69 | HR 78 | Resp 16 | Ht 63.0 in | Wt 154.0 lb

## 2018-06-18 DIAGNOSIS — E782 Mixed hyperlipidemia: Secondary | ICD-10-CM | POA: Diagnosis not present

## 2018-06-18 DIAGNOSIS — I10 Essential (primary) hypertension: Secondary | ICD-10-CM

## 2018-06-18 DIAGNOSIS — G453 Amaurosis fugax: Secondary | ICD-10-CM | POA: Diagnosis not present

## 2018-06-18 DIAGNOSIS — I6522 Occlusion and stenosis of left carotid artery: Secondary | ICD-10-CM

## 2018-06-18 NOTE — Progress Notes (Signed)
MRN : [id number]  Cassandra Benson is a 77 y.o. ([birth year]) female who presents with chief complaint of  Chief Complaint  Patient presents with  . Follow-up    review CT results  .  History of Present Illness:   The patient is seen for evaluation of a worsening chronic headache.  She has had multiple episodes of visual disturbance consistent with amaurosis fugax.  There was no loss of consciousness.  There have been two or three prior episodes over the past several years.  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  There is no recent history of TIA symptoms or focal motor deficits since the CT angiogram. There is no prior documented CVA.  The patient was not taking enteric-coated aspirin 81 mg daily at the time.  There is no history of migraine headaches or prior diagnosis of ocular migraine but as noted above the patient has and continues to be evaluated for her headache. There is no history of seizures.  The patient does not have a history of coronary artery disease, no recent episodes of angina or shortness of breath.  The patient denies PAD or claudication symptoms. There is a history of hyperlipidemia which is being treated with a statin.   The patient is seen for follow up evaluation of carotid stenosis status post CT angiogram. CT scan was done 06/07/2018. Patient reports that the test went well with no problems or complications.   CT angiogram is reviewed by me personally and shows 80% stenosis of the left internal carotid artery.   Current Meds  Medication Sig  . aspirin EC 81 MG tablet Take 81 mg by mouth daily.  . BD ULTRA-FINE LANCETS lancets Use as instructed upto 6 times daily  . co-enzyme Q-10 30 MG capsule Take 30 mg by mouth daily.  . Cyanocobalamin (B-12 SL) Place 1 drop under the tongue daily.   . diclofenac sodium (VOLTAREN) 1 % GEL Apply 1 application topically 3 (three) times daily.  . ergocalciferol (VITAMIN  D2) 50000 units capsule Take 50,000 Units by mouth as directed. Every 14 to 30 days  . glucose blood (CONTOUR NEXT TEST) test strip Use as instructed to check 4 times daily  . insulin aspart (NOVOLOG) 100 UNIT/ML injection Use in an insulin pump upto 50 units per day  . Krill Oil 500 MG CAPS Take 2 capsules (1,000 mg total) by mouth daily.  . Lecithin 400 MG CAPS Take 1 capsule by mouth 3 (three) times a week.   . levothyroxine (SYNTHROID, LEVOTHROID) 75 MCG tablet TAKE 1 TABLET BY MOUTH ONCE DAILY FOR 6 DAYS AND 1/2 TABLETS ON THE 7TH DAY  . lisinopril (PRINIVIL,ZESTRIL) 5 MG tablet Take 1 tablet (5 mg total) by mouth 2 (two) times daily.  . Multiple Vitamins-Minerals (PRESERVISION AREDS) TABS Take 2 tablets by mouth daily.  . naproxen sodium (ALEVE) 220 MG tablet Take 220 mg by mouth.  . nitroGLYCERIN (NITROSTAT) 0.4 MG SL tablet Place 1 tablet (0.4 mg total) under the tongue every 5 (five) minutes as needed for chest pain.  . pantoprazole (PROTONIX) 40 MG tablet Take 1 tablet (40 mg total) by mouth daily.  . rosuvastatin (CRESTOR) 10 MG tablet Take 1 tablet (10 mg total) by mouth every other day.  Marland Kitchen SYNTHROID 75 MCG tablet TAKE 1 TABLET BY MOUTH ONCE DAILY FOR 6 DAYS AND 1 AND 1/2 TABLETS ON THE 7TH DAY  . traMADol (ULTRAM) 50 MG tablet Take  0.5-1 tablets (25-50 mg total) by mouth 2 (two) times daily as needed for moderate pain.    Past Medical History:  Diagnosis Date  . Allergy   . Arthritis   . Carotid stenosis, asymptomatic 06/19/2015   8-41% RICA 32-44% LICA rpt 1 yr (0/1027)   . Diabetes mellitus without complication ([place])   . Family history of adverse reaction to anesthesia    brothr went into cardiac arrest from anectine  . Fibromyalgia    prior PCP  . GERD (gastroesophageal reflux disease)    prior PCP  . Glaucoma    Narrow angle  . History of blood clots    DVT, in 20s, none since  . History of chicken pox   . History of diverticulitis   . History of pericarditis 1986     with hospitalization  . History of pneumonia 2014  . History of shingles   . History of UTI   . Hyperlipidemia   . Hypertension   . Hypothyroidism   . Lupus (systemic lupus erythematosus) ([place])   . Shoulder pain left   h/o RTC tendonitis and adhesive capsulitis  . Sleep apnea    prior PCP - no CPAP for about 10 yrs  . Vitamin D deficiency    prior PCP    Past Surgical History:  Procedure Laterality Date  . ABDOMINAL HYSTERECTOMY  1978   fibroids and menorrhagia, ovaries remain  . ARTERY BIOPSY Right 04/06/2018   Procedure: BIOPSY TEMPORAL ARTERY RIGHT;  Surgeon: Beverly Gust, MD;  Location: [facility];  Service: ENT;  Laterality: Right;  Diabetic - insulin pump sleep apnea  . [facility] normal per patient  . COLONOSCOPY WITH ESOPHAGOGASTRODUODENOSCOPY (EGD)  03/2007   2 ulcers, benign polyp, rpt 5 yrs ([place], [place])  . PARTIAL HIP ARTHROPLASTY  2013   Right hip replacement  . TONSILLECTOMY AND ADENOIDECTOMY    . VAGINAL DELIVERY     x2, no complications    Social History Social History   Tobacco Use  . Smoking status: Never Smoker  . Smokeless tobacco: Never Used  Substance Use Topics  . Alcohol use: No  . Drug use: No    Family History Family History  Problem Relation Age of Onset  . CAD Mother 89       MI, aortic valve issues  . COPD Mother   . Lupus Mother   . Graves' disease Mother   . Rheum arthritis Mother   . CAD Father 66       CABG x2, aortic valve replacement  . Stroke Sister   . Alcohol abuse Brother   . CAD Brother 80       MI  . Stroke Brother   . Seizures Son   . COPD Brother   . CAD Brother 14       stent  . Diabetes Brother   . Depression Grandchild   . Cancer Maternal Aunt        breast  . Breast cancer Maternal Aunt   . Diabetes Sister        deceased  . CAD Sister 61       stents  . Breast cancer Maternal Aunt     Allergies  Allergen Reactions  . Iodinated  Diagnostic Agents Other (See Comments)    Itching (severe) and chest tightness  . Penicillins Anaphylaxis, Swelling and Rash    Has patient had a PCN reaction causing immediate rash, facial/tongue/throat  swelling, SOB or lightheadedness with hypotension: Yes Has patient had a PCN reaction causing severe rash involving mucus membranes or skin necrosis: Unknown Has patient had a PCN reaction that required hospitalization: Unknown Has patient had a PCN reaction occurring within the last 10 years: No If all of the above answers are "NO", then may proceed with Cephalosporin use.   Marland Kitchen Anectine [Succinylcholine]     Brother went into cardiac arrest.  . Codeine Nausea Only  . Gabapentin     Gait abnormality  . Influenza Vaccines     Muscle weakness; unable to walk  . Nortriptyline     Eye swelling and mouth drawed up  . Pamelor [Nortriptyline Hcl]     Patient states caused her face to draw in together.  . Valsartan     Allergy to generic only Other reaction(s): Cough "Hacking" cough  . Erythromycin Rash and Swelling  . Sulfa Antibiotics Rash     REVIEW OF SYSTEMS (Negative unless checked)  Constitutional: [] Weight loss  [] Fever  [] Chills Cardiac: [] Chest pain   [] Chest pressure   [] Palpitations   [] Shortness of breath when laying flat   [] Shortness of breath with exertion. Vascular:  [] Pain in legs with walking   [] Pain in legs at rest  [] History of DVT   [] Phlebitis   [] Swelling in legs   [] Varicose veins   [] Non-healing ulcers Pulmonary:   [] Uses home oxygen   [] Productive cough   [] Hemoptysis   [] Wheeze  [] COPD   [] Asthma Neurologic:  [] Dizziness   [] Seizures   [] History of stroke   [x] History of TIA  [] Aphasia   [x] Vissual changes   [] Weakness or numbness in arm   [] Weakness or numbness in leg Musculoskeletal:   [] Joint swelling   [x] Joint pain   [] Low back pain Hematologic:  [] Easy bruising  [] Easy bleeding   [] Hypercoagulable state   [] Anemic Gastrointestinal:  [] Diarrhea    [] Vomiting  [] Gastroesophageal reflux/heartburn   [] Difficulty swallowing. Genitourinary:  [] Chronic kidney disease   [] Difficult urination  [] Frequent urination   [] Blood in urine Skin:  [] Rashes   [] Ulcers  Psychological:  [] History of anxiety   []  History of major depression.  Physical Examination  Vitals:   06/18/18 0944  BP: (!) 147/69  Pulse: 78  Resp: 16  Weight: 154 lb (69.9 kg)  Height: 5\' 3"  (1.6 m)   Body mass index is 27.28 kg/m. Gen: WD/WN, NAD Head: [place]/AT, No temporalis wasting.  Ear/Nose/Throat: Hearing grossly intact, nares w/o erythema or drainage Eyes: PER, EOMI, sclera nonicteric.  Neck: Supple, no large masses.   Pulmonary:  Good air movement, no audible wheezing bilaterally, no use of accessory muscles.  Cardiac: RRR, no JVD Vascular:  Left carotid bruit Vessel Right Left  Radial Palpable Palpable  Brachial Palpable Palpable  Carotid Palpable Palpable  Gastrointestinal: Non-distended. No guarding/no peritoneal signs.  Musculoskeletal: M/S 5/5 throughout.  No deformity or atrophy.  Neurologic: CN 2-12 intact. Symmetrical.  Speech is fluent. Motor exam as listed above. Psychiatric: Judgment intact, Mood & affect appropriate for pt's clinical situation. Dermatologic: No rashes or ulcers noted.  No changes consistent with cellulitis. Lymph : No lichenification or skin changes of chronic lymphedema.  CBC Lab Results  Component Value Date   WBC 8.0 01/30/2018   HGB 14.1 01/30/2018   HCT 42.8 01/30/2018   MCV 80.1 01/30/2018   PLT 250 01/30/2018    BMET    Component Value Date/Time   NA 136 01/30/2018 1718   NA 141 01/11/2016  K 4.2 01/30/2018 1718   K 4.2 01/11/2016   CL 101 01/30/2018 1718   CO2 25 01/30/2018 1718   GLUCOSE 157 (H) 01/30/2018 1718   BUN 17 01/30/2018 1718   CREATININE 0.80 06/07/2018 0903   CREATININE 0.89 01/11/2016   CALCIUM 9.8 01/30/2018 1718   GFRNONAA >60 01/30/2018 1718   GFRAA >60 01/30/2018 1718   Estimated  Creatinine Clearance: 57 mL/min (by C-G formula based on SCr of 0.8 mg/dL).  COAG No results found for: INR, PROTIME  Radiology Ct Angio Head W Or Wo Contrast  Result Date: 06/07/2018 CLINICAL DATA:  Chronic intractable headache. Known carotid stenosis. EXAM: CT ANGIOGRAPHY HEAD AND NECK TECHNIQUE: Multidetector CT imaging of the head and neck was performed using the standard protocol during bolus administration of intravenous contrast. Multiplanar CT image reconstructions and MIPs were obtained to evaluate the vascular anatomy. Carotid stenosis measurements (when applicable) are obtained utilizing NASCET criteria, using the distal internal carotid diameter as the denominator. CONTRAST:  26mL OMNIPAQUE IOHEXOL 350 MG/ML SOLN COMPARISON:  01/30/2018 head CT. FINDINGS: CT HEAD FINDINGS Brain: No evidence of acute infarction, hemorrhage, hydrocephalus, extra-axial collection or mass lesion/mass effect. Vascular: No hyperdense vessel or unexpected calcification. Skull: Normal. Negative for fracture or focal lesion. Sinuses: Negative Orbits: Negative Review of the MIP images confirms the above findings CTA NECK FINDINGS Aortic arch: 2 vessel branching. Noncalcified atheromatous wall thickening best seen along the proximal descending segment. Right carotid system: Vessels are smooth and widely patent. No definite atheromatous changes. Left carotid system: Motion artifact at the level of the proximal common carotid without suspected underlying stenosis. There is a focal noncalcified posterior wall plaque at the proximal left ICA causing 70% stenosis. No ulceration. Vertebral arteries: No proximal subclavian stenosis. Mild left subclavian atherosclerosis. Codominant vertebral arteries that are smooth and widely patent to the dura. Skeleton: Degenerative changes that acute or aggressive finding. Other neck: No evidence of mass or inflammation. Bilateral cataract resection. Atrophic appearance of the thyroid Upper  chest: Negative Review of the MIP images confirms the above findings CTA HEAD FINDINGS Anterior circulation: Calcified plaque on the carotid siphons with up to 50% stenosis at the left paraclinoid segment. Milder narrowing at the right paraclinoid segment. Early branching left MCA. No branch occlusion. Infundibular appearance at the left supraclinoid ICA. Negative for aneurysm. Posterior circulation: Codominant vertebral arteries. Vertebral and basilar arteries are smooth and widely patent. No branch occlusion, beading, or aneurysm Venous sinuses: Negative Anatomic variants: None significant Delayed phase: No abnormal intracranial enhancement. Review of the MIP images confirms the above findings IMPRESSION: 1. ~70% atheromatous narrowing of the proximal left ICA. 2. Carotid siphon atherosclerosis with up to 50% narrowing at the left paraclinoid segment. Electronically Signed   By: Monte Fantasia M.D.   On: 06/07/2018 11:48   Ct Angio Neck W Or Wo Contrast  Result Date: 06/07/2018 CLINICAL DATA:  Chronic intractable headache. Known carotid stenosis. EXAM: CT ANGIOGRAPHY HEAD AND NECK TECHNIQUE: Multidetector CT imaging of the head and neck was performed using the standard protocol during bolus administration of intravenous contrast. Multiplanar CT image reconstructions and MIPs were obtained to evaluate the vascular anatomy. Carotid stenosis measurements (when applicable) are obtained utilizing NASCET criteria, using the distal internal carotid diameter as the denominator. CONTRAST:  74mL OMNIPAQUE IOHEXOL 350 MG/ML SOLN COMPARISON:  01/30/2018 head CT. FINDINGS: CT HEAD FINDINGS Brain: No evidence of acute infarction, hemorrhage, hydrocephalus, extra-axial collection or mass lesion/mass effect. Vascular: No hyperdense vessel or unexpected calcification. Skull: Normal.  Negative for fracture or focal lesion. Sinuses: Negative Orbits: Negative Review of the MIP images confirms the above findings CTA NECK FINDINGS  Aortic arch: 2 vessel branching. Noncalcified atheromatous wall thickening best seen along the proximal descending segment. Right carotid system: Vessels are smooth and widely patent. No definite atheromatous changes. Left carotid system: Motion artifact at the level of the proximal common carotid without suspected underlying stenosis. There is a focal noncalcified posterior wall plaque at the proximal left ICA causing 70% stenosis. No ulceration. Vertebral arteries: No proximal subclavian stenosis. Mild left subclavian atherosclerosis. Codominant vertebral arteries that are smooth and widely patent to the dura. Skeleton: Degenerative changes that acute or aggressive finding. Other neck: No evidence of mass or inflammation. Bilateral cataract resection. Atrophic appearance of the thyroid Upper chest: Negative Review of the MIP images confirms the above findings CTA HEAD FINDINGS Anterior circulation: Calcified plaque on the carotid siphons with up to 50% stenosis at the left paraclinoid segment. Milder narrowing at the right paraclinoid segment. Early branching left MCA. No branch occlusion. Infundibular appearance at the left supraclinoid ICA. Negative for aneurysm. Posterior circulation: Codominant vertebral arteries. Vertebral and basilar arteries are smooth and widely patent. No branch occlusion, beading, or aneurysm Venous sinuses: Negative Anatomic variants: None significant Delayed phase: No abnormal intracranial enhancement. Review of the MIP images confirms the above findings IMPRESSION: 1. ~70% atheromatous narrowing of the proximal left ICA. 2. Carotid siphon atherosclerosis with up to 50% narrowing at the left paraclinoid segment. Electronically Signed   By: Monte Fantasia M.D.   On: 06/07/2018 11:48     Assessment/Plan 1. Symptomatic stenosis of left carotid artery without infarction Recommend:  The patient is symptomatic with respect to the carotid stenosis.  The patient now has progressed and  has a lesion the is >70%.  Patient's CT angiography of the carotid arteries confirms >70% left ICA stenosis.  The anatomical considerations support stenting over surgery.  This was discussed in detail with the patient.  The risks, benefits and alternative therapies were reviewed in detail with the patient.  All questions were answered.  The patient agrees to proceed with stenting of the left carotid artery.  Continue antiplatelet therapy as prescribed. Continue management of CAD, HTN and Hyperlipidemia. Healthy heart diet, encouraged exercise at least 4 times per week.   2. Essential hypertension Continue antihypertensive medications as already ordered, these medications have been reviewed and there are no changes at this time.   3. Mixed hyperlipidemia Continue statin as ordered and reviewed, no changes at this time     Hortencia Pilar, MD  06/18/2018 9:54 AM

## 2018-06-19 ENCOUNTER — Ambulatory Visit (INDEPENDENT_AMBULATORY_CARE_PROVIDER_SITE_OTHER): Payer: PPO | Admitting: Family Medicine

## 2018-06-19 ENCOUNTER — Telehealth: Payer: Self-pay | Admitting: Cardiovascular Disease

## 2018-06-19 ENCOUNTER — Encounter: Payer: Self-pay | Admitting: Family Medicine

## 2018-06-19 VITALS — BP 136/68 | HR 71 | Temp 97.8°F | Ht 63.0 in | Wt 155.2 lb

## 2018-06-19 DIAGNOSIS — I6522 Occlusion and stenosis of left carotid artery: Secondary | ICD-10-CM

## 2018-06-19 DIAGNOSIS — I7090 Unspecified atherosclerosis: Secondary | ICD-10-CM | POA: Diagnosis not present

## 2018-06-19 NOTE — Telephone Encounter (Signed)
   [facility] Pre-operative Risk Assessment    Request for surgical clearance:  1. What type of surgery is being performed? Lt carotid stent placement  2. When is this surgery scheduled? Not listed  3. What type of clearance is required (medical clearance vs. Pharmacy clearance to hold med vs. Both)? Not listed  4. Are there any medications that need to be held prior to surgery and how long? Not listed  5. Practice name and name of physician performing surgery? [facility]  6. What is your office phone number [phone]   7.   What is your office fax number [phone]  8.   Anesthesia type (None, local, MAC, general) ? Not listed   Lucienne Minks 06/19/2018, 2:51 PM  _________________________________________________________________   (provider comments below)

## 2018-06-19 NOTE — Assessment & Plan Note (Addendum)
Discussed recent [facility] eval. Pt desires to see a surgeon in [place] as she is more comfortable having surgery through [facility]. She is appreciative of Dr Atlee Abide care.  We had a conference call with her daughter Sharee Pimple who lives in [place] and discussed upcoming steps.

## 2018-06-19 NOTE — Telephone Encounter (Signed)
Last myoview 09/2017. Last echo 09/2017. Routing to Dr Rockey Situ for clearance.

## 2018-06-19 NOTE — Progress Notes (Signed)
BP 136/68 (BP Location: Left Arm, Patient Position: Sitting, Cuff Size: Normal)   Pulse 71   Temp 97.8 F (36.6 C) (Oral)   Ht 5\' 3"  (1.6 m)   Wt 155 lb 4 oz (70.4 kg)   SpO2 97%   BMI 27.50 kg/m    CC: discuss results Subjective:    Patient ID: Cassandra Benson, female    DOB: [birth year], 77 y.o.   MRN: [id number]  HPI: Cassandra Benson is a 77 y.o. female presenting on 06/19/2018 for Results (Here to review head/neck CT results.)   See prior notes for details. Saw Dr Ronalee Belts yesterday, note reviewed - recommended carotid stent for >70% symptomatic stenosis LICA. 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   Relevant past medical, surgical, family and social history reviewed and updated as indicated. Interim medical history since our last visit reviewed. Allergies and medications reviewed and updated. Outpatient Medications Prior to Visit  Medication Sig Dispense Refill  . aspirin EC 81 MG tablet Take 81 mg by mouth daily.    . BD ULTRA-FINE LANCETS lancets Use as instructed upto 6 times daily 600 each 2  . ciprofloxacin (CILOXAN) 0.3 % ophthalmic solution Place 1 drop into the left eye every 4 (four) hours while awake. 5 mL 0  . co-enzyme Q-10 30 MG capsule Take 30 mg by mouth daily.    . Cyanocobalamin (B-12 SL) Place 1 drop under the tongue daily.     . diclofenac sodium (VOLTAREN) 1 % GEL Apply 1 application topically 3 (three) times daily. 1 Tube 1  . ergocalciferol (VITAMIN D2) 50000 units capsule Take 50,000 Units by mouth as directed. Every 14 to 30 days    . glucose blood (CONTOUR NEXT TEST) test strip Use as instructed to check 4 times daily 400 each 5  . insulin aspart (NOVOLOG) 100 UNIT/ML injection Use in an insulin pump upto 50 units per day 2 vial 6  . Krill Oil 500 MG CAPS Take 2 capsules (1,000 mg total) by mouth daily.    . Lecithin 400 MG CAPS Take 1 capsule by mouth 3 (three) times a week.     . levothyroxine (SYNTHROID, LEVOTHROID) 75 MCG tablet TAKE 1  TABLET BY MOUTH ONCE DAILY FOR 6 DAYS AND 1/2 TABLETS ON THE 7TH DAY  0  . lisinopril (PRINIVIL,ZESTRIL) 5 MG tablet Take 1 tablet (5 mg total) by mouth 2 (two) times daily. 180 tablet 1  . Multiple Vitamins-Minerals (PRESERVISION AREDS) TABS Take 2 tablets by mouth daily.    . naproxen sodium (ALEVE) 220 MG tablet Take 220 mg by mouth.    . nitroGLYCERIN (NITROSTAT) 0.4 MG SL tablet Place 1 tablet (0.4 mg total) under the tongue every 5 (five) minutes as needed for chest pain. 30 tablet 0  . pantoprazole (PROTONIX) 40 MG tablet Take 1 tablet (40 mg total) by mouth daily. 30 tablet 1  . rosuvastatin (CRESTOR) 10 MG tablet Take 1 tablet (10 mg total) by mouth every other day. 30 tablet 6  . SYNTHROID 75 MCG tablet TAKE 1 TABLET BY MOUTH ONCE DAILY FOR 6 DAYS AND 1 AND 1/2 TABLETS ON THE 7TH DAY 90 tablet 0  . traMADol (ULTRAM) 50 MG tablet Take 0.5-1 tablets (25-50 mg total) by mouth 2 (two) times daily as needed for moderate pain. 30 tablet 0  . venlafaxine (EFFEXOR) 37.5 MG tablet Take 1 tablet (37.5 mg total) by mouth at bedtime.     No facility-administered medications prior to visit.  Per HPI unless specifically indicated in ROS section below Review of Systems     Objective:    BP 136/68 (BP Location: Left Arm, Patient Position: Sitting, Cuff Size: Normal)   Pulse 71   Temp 97.8 F (36.6 C) (Oral)   Ht 5\' 3"  (1.6 m)   Wt 155 lb 4 oz (70.4 kg)   SpO2 97%   BMI 27.50 kg/m   Wt Readings from Last 3 Encounters:  06/19/18 155 lb 4 oz (70.4 kg)  06/18/18 154 lb (69.9 kg)  06/01/18 153 lb 3 oz (69.5 kg)    Physical Exam  Constitutional: She appears well-developed and well-nourished. No distress.  Psychiatric: She has a normal mood and affect.  Nursing note and vitals reviewed.  Results for orders placed or performed in visit on 06/11/18  Lipid panel  Result Value Ref Range   Cholesterol 214 (H) 0 - 200 mg/dL   Triglycerides 118.0 0.0 - 149.0 mg/dL   HDL 43.60 >39.00 mg/dL    VLDL 23.6 0.0 - 40.0 mg/dL   LDL Cholesterol 147 (H) 0 - 99 mg/dL   Total CHOL/HDL Ratio 5    NonHDL 170.79   Hemoglobin A1c  Result Value Ref Range   Hgb A1c MFr Bld 8.1 (H) 4.6 - 6.5 %      Assessment & Plan:  Over 25 minutes were spent face-to-face with the patient during this encounter and >50% of that time was spent on counseling and coordination of care  Problem List Items Addressed This Visit    Monckeberg's medial sclerosis   Carotid stenosis, symptomatic w/o infarct - Primary    Discussed recent [facility] eval. Pt desires to see a surgeon in [place] as she is more comfortable having surgery through [facility]. She is appreciative of Dr Atlee Abide care.  We had a conference call with her daughter Sharee Pimple who lives in [place] and discussed upcoming steps.           No orders of the defined types were placed in this encounter.  No orders of the defined types were placed in this encounter.   Follow up plan: Return if symptoms worsen or fail to improve.  [name], MD

## 2018-06-19 NOTE — Patient Instructions (Signed)
Good to see you today.  Next step will be seeing vascular docs in [place].

## 2018-06-20 ENCOUNTER — Encounter: Payer: Self-pay | Admitting: Family Medicine

## 2018-06-26 NOTE — Telephone Encounter (Signed)
  Acceptable risk for procedure No further testing needed 

## 2018-06-27 NOTE — Telephone Encounter (Signed)
Copy of phone encounter faxed to [facility] at [phone]. Confirmation received.

## 2018-06-29 ENCOUNTER — Encounter (INDEPENDENT_AMBULATORY_CARE_PROVIDER_SITE_OTHER): Payer: Self-pay

## 2018-07-03 ENCOUNTER — Other Ambulatory Visit: Payer: Self-pay | Admitting: *Deleted

## 2018-07-03 ENCOUNTER — Ambulatory Visit (INDEPENDENT_AMBULATORY_CARE_PROVIDER_SITE_OTHER): Payer: PPO | Admitting: Vascular Surgery

## 2018-07-03 ENCOUNTER — Encounter: Payer: Self-pay | Admitting: *Deleted

## 2018-07-03 ENCOUNTER — Encounter: Payer: Self-pay | Admitting: Vascular Surgery

## 2018-07-03 VITALS — BP 160/86 | HR 92 | Temp 97.4°F | Resp 16 | Ht 63.5 in | Wt 151.0 lb

## 2018-07-03 DIAGNOSIS — I6522 Occlusion and stenosis of left carotid artery: Secondary | ICD-10-CM | POA: Diagnosis not present

## 2018-07-03 NOTE — Progress Notes (Signed)
Patient name: Cassandra Benson MRN: [id number] DOB: [birth year] Sex: female  REASON FOR CONSULT: Second opinion for high grade left carotid stenosis  HPI: Cassandra Benson is a 77 y.o. female, with history of hypertension, hyperlipidemia, diabetes that presents for second opinion for left internal carotid artery stenosis.  Patient states she has been under surveillance for the last 2-1/2 years after a bruit was initially identified in her left neck.  Her PCP has been following this over time.  Most recently she has had issues with right-sided headaches as well as a flareup of her lupus and underwent a right temporal artery biopsy that showed medial artery calcification.  She had some right visual changes, but denies any left-sided visual changes.  During this subsequent work-up she was identified to have progression of her left carotid stenosis per the patient.  She has had some intermittent right-sided hand weakness over the last 4 months that she describes as intermittent and usually last for less than a minute. Sometimes only happens every couple of weeks. The symptoms are only in her right hand when she is lifting objects.  She has no CT evidence of acute stroke and does not believe she had an MRI.  She was seen by a vascular surgeon at [facility] and was offered a transfemoral left carotid stent.  No new symptoms since her evaluation at [facility].  Denies head or neck radiation.  Was given a script for Plavix and has not started that yet.  Past Medical History:  Diagnosis Date  . Allergy   . Arthritis   . Carotid stenosis, asymptomatic 06/19/2015   9-47% RICA 09-62% LICA rpt 1 yr (04/3661)   . Diabetes mellitus without complication ([place])   . Family history of adverse reaction to anesthesia    brothr went into cardiac arrest from anectine  . Fibromyalgia    prior PCP  . GERD (gastroesophageal reflux disease)    prior PCP  . Glaucoma    Narrow angle  . History of blood clots    DVT, in 20s,  none since  . History of chicken pox   . History of diverticulitis   . History of pericarditis 1986   with hospitalization  . History of pneumonia 2014  . History of shingles   . History of UTI   . Hyperlipidemia   . Hypertension   . Hypothyroidism   . Lupus (systemic lupus erythematosus) ([place])   . Shoulder pain left   h/o RTC tendonitis and adhesive capsulitis  . Sleep apnea    prior PCP - no CPAP for about 10 yrs  . Vitamin D deficiency    prior PCP    Past Surgical History:  Procedure Laterality Date  . ABDOMINAL HYSTERECTOMY  1978   fibroids and menorrhagia, ovaries remain  . ARTERY BIOPSY Right 04/06/2018   Procedure: BIOPSY TEMPORAL ARTERY RIGHT;  Surgeon: Beverly Gust, MD;  Location: [facility];  Service: ENT;  Laterality: Right;  Diabetic - insulin pump sleep apnea  . [facility] normal per patient  . COLONOSCOPY WITH ESOPHAGOGASTRODUODENOSCOPY (EGD)  03/2007   2 ulcers, benign polyp, rpt 5 yrs ([place], [place])  . PARTIAL HIP ARTHROPLASTY  2013   Right hip replacement  . TONSILLECTOMY AND ADENOIDECTOMY    . VAGINAL DELIVERY     x2, no complications    Family History  Problem Relation Age of Onset  . CAD Mother 47  MI, aortic valve issues  . COPD Mother   . Lupus Mother   . Graves' disease Mother   . Rheum arthritis Mother   . CAD Father 1       CABG x2, aortic valve replacement  . Stroke Sister   . Alcohol abuse Brother   . CAD Brother 89       MI  . Stroke Brother   . Seizures Son   . COPD Brother   . CAD Brother 30       stent  . Diabetes Brother   . Depression Grandchild   . Cancer Maternal Aunt        breast  . Breast cancer Maternal Aunt   . Diabetes Sister        deceased  . CAD Sister 36       stents  . Breast cancer Maternal Aunt     SOCIAL HISTORY: Social History   Socioeconomic History  . Marital status: Widowed    Spouse name: Not on file  . Number of children: Not  on file  . Years of education: Not on file  . Highest education level: Not on file  Occupational History  . Not on file  Social Needs  . Financial resource strain: Not on file  . Food insecurity:    Worry: Not on file    Inability: Not on file  . Transportation needs:    Medical: Not on file    Non-medical: Not on file  Tobacco Use  . Smoking status: Never Smoker  . Smokeless tobacco: Never Used  Substance and Sexual Activity  . Alcohol use: No  . Drug use: No  . Sexual activity: Not on file  Lifestyle  . Physical activity:    Days per week: Not on file    Minutes per session: Not on file  . Stress: Not on file  Relationships  . Social connections:    Talks on phone: Not on file    Gets together: Not on file    Attends religious service: Not on file    Active member of club or organization: Not on file    Attends meetings of clubs or organizations: Not on file    Relationship status: Not on file  . Intimate partner violence:    Fear of current or ex partner: Not on file    Emotionally abused: Not on file    Physically abused: Not on file    Forced sexual activity: Not on file  Other Topics Concern  . Not on file  Social History Narrative   Lives in [place] now. Recently moved from [place]. Widow - husband decreased 01/2016 of metastatic colon CA   No pets.   Mother of Dan Dissinger.   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 Iodinated Diagnostic Agents Other (See Comments)    Itching (severe) and chest tightness  . Penicillins Anaphylaxis, Swelling and Rash    Has patient had a PCN reaction causing immediate rash, facial/tongue/throat swelling, SOB or lightheadedness with hypotension: Yes Has patient had a PCN reaction  causing severe rash involving mucus membranes or skin necrosis: Unknown Has patient had a PCN reaction that required hospitalization: Unknown Has patient had a PCN  reaction occurring within the last 10 years: No If all of the above answers are "NO", then may proceed with Cephalosporin use.   Marland Kitchen Anectine [Succinylcholine]     Brother went into cardiac arrest.  . Codeine Nausea Only  . Gabapentin     Gait abnormality  . Influenza Vaccines     Muscle weakness; unable to walk  . Nortriptyline     Eye swelling and mouth drawed up  . Pamelor [Nortriptyline Hcl]     Patient states caused her face to draw in together.  . Valsartan     Allergy to generic only Other reaction(s): Cough "Hacking" cough  . Erythromycin Rash and Swelling  . Sulfa Antibiotics Rash    Current Outpatient Medications  Medication Sig Dispense Refill  . aspirin EC 81 MG tablet Take 81 mg by mouth daily.    . BD ULTRA-FINE LANCETS lancets Use as instructed upto 6 times daily 600 each 2  . clopidogrel (PLAVIX) 75 MG tablet TK 1 T PO QD  3  . co-enzyme Q-10 30 MG capsule Take 30 mg by mouth daily.    . Cyanocobalamin (B-12 SL) Place 1 drop under the tongue daily.     . diclofenac sodium (VOLTAREN) 1 % GEL Apply 1 application topically 3 (three) times daily. 1 Tube 1  . ergocalciferol (VITAMIN D2) 50000 units capsule Take 50,000 Units by mouth as directed. Every 14 to 30 days    . glucose blood (CONTOUR NEXT TEST) test strip Use as instructed to check 4 times daily 400 each 5  . insulin aspart (NOVOLOG) 100 UNIT/ML injection Use in an insulin pump upto 50 units per day 2 vial 6  . Krill Oil 500 MG CAPS Take 2 capsules (1,000 mg total) by mouth daily.    . Lecithin 400 MG CAPS Take 1 capsule by mouth 3 (three) times a week.     . levothyroxine (SYNTHROID, LEVOTHROID) 75 MCG tablet TAKE 1 TABLET BY MOUTH ONCE DAILY FOR 6 DAYS AND 1/2 TABLETS ON THE 7TH DAY  0  . lisinopril (PRINIVIL,ZESTRIL) 5 MG tablet Take 1 tablet (5  mg total) by mouth 2 (two) times daily. 180 tablet 1  . Multiple Vitamins-Minerals (PRESERVISION AREDS) TABS Take 2 tablets by mouth daily.    . naproxen sodium (ALEVE) 220 MG tablet Take 220 mg by mouth.    . nitroGLYCERIN (NITROSTAT) 0.4 MG SL tablet Place 1 tablet (0.4 mg total) under the tongue every 5 (five) minutes as needed for chest pain. 30 tablet 0  . pantoprazole (PROTONIX) 40 MG tablet Take 1 tablet (40 mg total) by mouth daily. 30 tablet 1  . rosuvastatin (CRESTOR) 10 MG tablet Take 1 tablet (10 mg total) by mouth every other day. 30 tablet 6  . SYNTHROID 75 MCG tablet TAKE 1 TABLET BY MOUTH ONCE DAILY FOR 6 DAYS AND 1 AND 1/2 TABLETS ON THE 7TH DAY 90 tablet 0  . traMADol (ULTRAM) 50 MG tablet Take 0.5-1 tablets (25-50 mg total) by mouth 2 (two) times daily as needed for moderate pain. 30 tablet 0  . venlafaxine (EFFEXOR) 37.5 MG tablet Take 1 tablet (37.5 mg total) by mouth at bedtime.    . ciprofloxacin (CILOXAN) 0.3 % ophthalmic solution Place 1 drop into the left eye every 4 (four) hours while awake. (Patient not taking: Reported on 07/03/2018) 5 mL 0   No current facility-administered medications for this visit.     REVIEW OF SYSTEMS:  [X]  denotes positive finding, [ ]   denotes negative finding Cardiac  Comments:  Chest pain or chest pressure:    Shortness of breath upon exertion:    Short of breath when lying flat:    Irregular heart rhythm:        Vascular    Pain in calf, thigh, or hip brought on by ambulation:    Pain in feet at night that wakes you up from your sleep:     Blood clot in your veins:    Leg swelling:         Pulmonary    Oxygen at home:    Productive cough:     Wheezing:         Neurologic    Sudden weakness in arms or legs:  x Right hand intermittent weakness  Sudden numbness in arms or legs:     Sudden onset of difficulty speaking or slurred speech:    Temporary loss of vision in one eye:  x Blurry vision, right eye  Problems with dizziness:          Gastrointestinal    Blood in stool:     Vomited blood:         Genitourinary    Burning when urinating:     Blood in urine:        Psychiatric    Major depression:         Hematologic    Bleeding problems:    Problems with blood clotting too easily:        Skin    Rashes or ulcers:        Constitutional    Fever or chills:      PHYSICAL EXAM: Vitals:   07/03/18 1015 07/03/18 1020 07/03/18 1022  BP: (!) 149/75 (!) 154/76 (!) 160/86  Pulse: 92 92 92  Resp: 16    Temp: (!) 97.4 F (36.3 C)    TempSrc: Oral    SpO2: 97%    Weight: 68.5 kg    Height: 5' 3.5" (1.613 m)      GENERAL: The patient is a well-nourished female, in no acute distress. The vital signs are documented above. CARDIAC: There is a regular rate and rhythm.  VASCULAR:  2+ radial pulse palpable BUE 2+ femoral pulse palpable bilateral groins 2+ DP palpable BLE PULMONARY: There is good air exchange bilaterally without wheezing or rales. ABDOMEN: Soft and non-tender with normal pitched bowel sounds.  MUSCULOSKELETAL: There are no major deformities or cyanosis. NEUROLOGIC: No focal weakness or paresthesias are detected. SKIN: There are no ulcers or rashes noted. PSYCHIATRIC: The patient has a normal affect.  DATA:   I independently reviewed her CTA neck and this shows a high-grade >80% stenosis of the left ICA approximately 20 mm past the carotid bifurcation.  The left common carotid artery is fairly disease free.  Assessment/Plan:  77 year old female that presents with high-grade left internal carotid artery stenosis.  This has been followed as an asymptomatic lesion over the last 2-1/2 years per the patient.  Most recently over the last 3 to 4 months she has had some intermittent right hand weakness that suggest this may be a symptomatic lesion.  After reviewing her anatomy, I think she is a reasonable TCAR candidate.  I have recommended a left TCAR with trans-carotid artery revascularization  including angioplasty/stent after review of her imaging.  She qualifies based on age >59 and high lesion.  I offered her surgery as soon as this Friday.  Unfortunately the patient wants to wait  till October 28 when her daughter is planning to come to town so she has assistance at home.  She has a prescription for Plavix and I have instructed her to start Plavix immediately.  She is already on an aspirin and a statin.  We will arrange for left TCAR on 07/23/2018.  I quoted her an approximate 1% stroke risk from the procedure after risks and benefits were discussed.    Marty Heck, MD [facility] Office: [phone] Pager: [place]

## 2018-07-04 ENCOUNTER — Ambulatory Visit: Payer: PPO | Admitting: Internal Medicine

## 2018-07-04 ENCOUNTER — Encounter: Payer: Self-pay | Admitting: Family Medicine

## 2018-07-05 ENCOUNTER — Ambulatory Visit (INDEPENDENT_AMBULATORY_CARE_PROVIDER_SITE_OTHER): Payer: PPO | Admitting: Internal Medicine

## 2018-07-05 ENCOUNTER — Encounter: Payer: Self-pay | Admitting: Internal Medicine

## 2018-07-05 VITALS — BP 150/70 | HR 72 | Ht 63.5 in | Wt 153.0 lb

## 2018-07-05 DIAGNOSIS — E063 Autoimmune thyroiditis: Secondary | ICD-10-CM

## 2018-07-05 DIAGNOSIS — E038 Other specified hypothyroidism: Secondary | ICD-10-CM

## 2018-07-05 DIAGNOSIS — E139 Other specified diabetes mellitus without complications: Secondary | ICD-10-CM | POA: Diagnosis not present

## 2018-07-05 MED ORDER — SYNTHROID 75 MCG PO TABS: ORAL_TABLET | ORAL | 3 refills | Status: DC

## 2018-07-05 NOTE — Progress Notes (Signed)
Patient ID: Cassandra Benson, female   DOB: 06/12/1941, 77 y.o.   MRN: [id number]   HPI: Cassandra Benson is a 77 y.o.-year-old female, returning for f/u for LADA, initially dx'ed in 1998 (77 y/o), started insulin at dx, started insulin pump in ~2008, uncontrolled, without complications and hypothyroidism. She previously saw endocrinology at [facility] (Dr. Janese Banks) and Dr Howell Rucks. Last visit with me 4 months ago.  She had a temporal artery bx since last visit >> negative for temporal arteritis and positive for Monckeberg arterial sclerosis. She had Prednisone 3x since last visit to prevent allergy to contrast. She will have carotid sx soon (TCAR).  She was dx'ed with Sjogren sd.  By Dr. Milly Jakob  Last hemoglobin A1c was: Lab Results  Component Value Date   HGBA1C 8.1 (H) 06/11/2018   HGBA1C 7.7 (A) 02/28/2018   HGBA1C 6.7 10/30/2017   Patient is on insulin pump in pump:  - Medtronic 723- started in 09/2016 (changed 07/2017),  without CGM, uses NovoLog in the pump.  was using Medtr[employer] for supplies before, but now forced to use [employer].  She she was previously on Glucophage ER d.a.w. but had to stop due to not being able to obtain it.  Generic metformin ER gave her diarrhea in the past.  She is now off metformin.  Pump settings: - basal rates: 12 am: 0.800 units/h 3 am: 0.875 9:30 am: 0.100 5 pm: 1.500 6:30 pm: 1.300 11:30 pm: 1.800  - ICR:   12 am: 10 () 6 pm: 8 () - target: 130-130 except 6-8 am and 6 pm-12 am: 100-100 - ISF: 35 - Insulin on Board: 4h - bolus wizard: On TDD from basal insulin: 82% >> 69% >> 65% >> 69% TDD from bolus insulin: 18% >> 31% >> 35% >> 31% Total daily dose: 30 units per day (up to 40 units for prescriptions) - extended bolusing: Not using - changes infusion site: q 5 to 6 days! - Meter: Bayer Contour   Pt checks her sugars 2.1 times a day-average 158+/-55 - am:73-102 >> 98-137, 148 >> 125, 139-147 175 - 2h after b'fast:  n/c >> 107-148 >> n/c  - before  lunch:  82-109, 139, 163 >> 97-143, 155 >> 118-169 - 2h after lunch: 93 >> 87-129 >> 120, 141 - before dinner: 75-130, 182, 315 >> 95-173 >> 106-130 - 2h after dinner: 72-188, 235 >> 182, 244 >> only 2 values checked in the last 2 weeks, 290 and 392 - bedtime: n/c - nighttime:   92-141, 183 >> n/c Lowest sugar was 43 >> ... >> 72 >> 63 >> 45 (at night); she has hypoglycemia awareness in the 80s.  No history of hypoglycemia admission.  She does have a glucagon kit at home. Highest sugar was 315 >> 300 (prednisone) >> 300s on Pred.; no history of.DKA admissions.  Pt's meals are: - Breakfast: protein drink + almond milk - Lunch: PB jelly sandwich or sandwich with ham or cream cheese sandwich - Dinner: salads  - Snacks: pretzels; pork tenderloin + veggies; chicken + tenderloin  -No CKD, last BUN/creatinine:  Lab Results  Component Value Date   BUN 17 01/30/2018   BUN 14 01/27/2018   CREATININE 0.80 06/07/2018   CREATININE 0.84 01/30/2018  On Lisinopril. - + HL;  last set of lipids: Lab Results  Component Value Date   CHOL 214 (H) 06/11/2018   HDL 43.60 06/11/2018   LDLCALC 147 (H) 06/11/2018   LDLDIRECT 177.0 08/09/2016   TRIG 118.0  06/11/2018   CHOLHDL 5 06/11/2018  On Crestor 5 - but mm cramps.  She continues CoQ10.  Praluent was not covered. - last eye exam was in 10/2017 >> No DR - no numbness and tingling in her feet.  On Plavix and ASA 81 until the sx.  She was admitted for CP + SOB 10/01/2017.  Cardiac events and PE were ruled out.  She continues to have shortness of breath.  She is seeing cardiology (Dr. Rockey Situ).  Hypothyroidism: - 2/2 Hashimoto's thyroiditis -Family history of Graves' disease in mother - She was initially on Levoxyl 75 mcg daily 1 tab 6/7 days and 1.5 tabs 1/7 days, but she thought Levoxyl increased her lipid levels, so we changed to Synthroid.  She feels much better on the brand-name, without hair loss  She currently is on Synthroid 75 mcg 1 tab 6/7  days and 0.5 tabs 1/7 days.  Patient takes Synthroid: - in am - fasting - at least 30 min from b'fast - no Ca, Fe, PPIs - + MVI at night - not on Biotin  On this dose, latest TSH was normal: Lab Results  Component Value Date   TSH 0.92 11/27/2017   She also has SLE.  We checked her for hirsutism and acne and the labs are normal: Component     Latest Ref Rng & Units 09/02/2014 09/08/2014  Testosterone     3 - 41 ng/dL 59 (H)   Testosterone Free     0.0 - 4.2 pg/mL 2.1   FSH     mIU/mL  68.6  LH     mIU/mL  43.4  DHEA-SO4     7 - 177 ug/dL 49   Cortisol, Plasma     ug/dL  1.3 (Normal Dexamethasone suppression test)   Estradiol     pg/mL  24.0   Also: Component     Latest Ref Rng & Units 01/04/2017  Cortisol - AM     mcg/dL 17.6  C206 ACTH     6 - 50 pg/mL 15   No signs of adrenal insufficiency.  ROS: Constitutional: no weight gain/no weight loss, + fatigue, no subjective hyperthermia, no subjective hypothermia Eyes: + Blurry vision, no xerophthalmia ENT: no sore throat, no nodules palpated in throat, no dysphagia, no odynophagia, no hoarseness Cardiovascular: no CP/no SOB/no palpitations/+ ankle swelling Respiratory: no cough/no SOB/no wheezing Gastrointestinal: no N/no V/no D/no C/no acid reflux Musculoskeletal: no muscle aches/no joint aches Skin: no rashes, + hair loss Neurological: no tremors/no numbness/no tingling/no dizziness, + headache  I reviewed pt's medications, allergies, PMH, social hx, family hx, and changes were documented in the history of present illness. Otherwise, unchanged from my initial visit note.  She stopped Effexor since last visit and started Plavix.   Past Medical History:  Diagnosis Date  . Allergy   . Arthritis   . Carotid stenosis, asymptomatic 06/19/2015   9-37% RICA 34-28% LICA rpt 1 yr (03/6810)   . Diabetes mellitus without complication ([place])   . Family history of adverse reaction to anesthesia    brothr went into  cardiac arrest from anectine  . Fibromyalgia    prior PCP  . GERD (gastroesophageal reflux disease)    prior PCP  . Glaucoma    Narrow angle  . History of blood clots    DVT, in 20s, none since  . History of chicken pox   . History of diverticulitis   . History of pericarditis 1986   with hospitalization  .  History of pneumonia 2014  . History of shingles   . History of UTI   . Hyperlipidemia   . Hypertension   . Hypothyroidism   . Lupus (systemic lupus erythematosus) ([place])   . Shoulder pain left   h/o RTC tendonitis and adhesive capsulitis  . Sleep apnea    prior PCP - no CPAP for about 10 yrs  . Vitamin D deficiency    prior PCP   Past Surgical History:  Procedure Laterality Date  . ABDOMINAL HYSTERECTOMY  1978   fibroids and menorrhagia, ovaries remain  . ARTERY BIOPSY Right 04/06/2018   Procedure: BIOPSY TEMPORAL ARTERY RIGHT;  Surgeon: Beverly Gust, MD;  Location: [facility];  Service: ENT;  Laterality: Right;  Diabetic - insulin pump sleep apnea  . [facility] normal per patient  . COLONOSCOPY WITH ESOPHAGOGASTRODUODENOSCOPY (EGD)  03/2007   2 ulcers, benign polyp, rpt 5 yrs ([place], [place])  . PARTIAL HIP ARTHROPLASTY  2013   Right hip replacement  . TONSILLECTOMY AND ADENOIDECTOMY    . VAGINAL DELIVERY     x2, no complications   Social History   Social History  . Widowed   . Number of children: 2   Occupational History  Retired    Social History Main Topics  . Smoking status: Never Smoker  . Smokeless tobacco: Never Used  . Alcohol use No  . Drug use: No   Social History Narrative   Lives in [place] now. Recently moved from [place].   No pets.   Mother of Marcela Alatorre.   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 aspirin EC 81 MG tablet Take 81 mg by mouth daily.    . BD ULTRA-FINE LANCETS lancets Use as instructed upto 6 times daily 600 each 2  . ciprofloxacin (CILOXAN) 0.3 % ophthalmic solution Place 1 drop into the left eye every 4 (four) hours while awake. (Patient not taking: Reported on 07/03/2018) 5 mL 0  . clopidogrel (PLAVIX) 75 MG tablet TK 1 T PO QD  3  . co-enzyme Q-10 30 MG capsule Take 30 mg by mouth daily.    . Cyanocobalamin (B-12 SL) Place 1 drop under the tongue daily.     . diclofenac sodium (VOLTAREN) 1 % GEL Apply 1 application topically 3 (three) times daily. 1 Tube 1  . ergocalciferol (VITAMIN D2) 50000 units capsule Take 50,000 Units by mouth as directed. Every 14 to 30 days    . glucose blood (CONTOUR NEXT TEST) test strip Use as instructed to check 4 times daily 400 each 5  . insulin aspart (NOVOLOG) 100 UNIT/ML injection Use in an insulin pump upto 50 units per day 2 vial 6  . Krill Oil 500 MG CAPS Take 2 capsules (1,000 mg total) by mouth daily.    . Lecithin 400 MG CAPS Take 1 capsule by mouth 3 (three) times a week.     . levothyroxine (SYNTHROID, LEVOTHROID) 75 MCG tablet TAKE 1 TABLET BY MOUTH ONCE DAILY FOR 6 DAYS AND 1/2 TABLETS ON THE 7TH DAY  0  . lisinopril (PRINIVIL,ZESTRIL) 5 MG tablet Take 1 tablet (5 mg total)  by mouth 2 (two) times daily. 180 tablet 1  . Multiple Vitamins-Minerals (PRESERVISION AREDS) TABS Take 2 tablets by mouth daily.    . naproxen sodium (ALEVE) 220 MG tablet Take 220 mg by mouth.    . nitroGLYCERIN (NITROSTAT) 0.4 MG SL tablet Place 1 tablet (0.4 mg total) under the tongue every 5 (five) minutes as needed for chest pain. 30 tablet 0  . pantoprazole (PROTONIX) 40 MG tablet Take 1 tablet (40 mg total) by mouth daily. 30 tablet 1  . rosuvastatin (CRESTOR) 10 MG tablet Take 1 tablet (10 mg total) by mouth every other day. 30  tablet 6  . SYNTHROID 75 MCG tablet TAKE 1 TABLET BY MOUTH ONCE DAILY FOR 6 DAYS AND 1 AND 1/2 TABLETS ON THE 7TH DAY 90 tablet 0  . traMADol (ULTRAM) 50 MG tablet Take 0.5-1 tablets (25-50 mg total) by mouth 2 (two) times daily as needed for moderate pain. 30 tablet 0  . venlafaxine (EFFEXOR) 37.5 MG tablet Take 1 tablet (37.5 mg total) by mouth at bedtime.     No current facility-administered medications on file prior to visit.    Allergies  Allergen Reactions  . Iodinated Diagnostic Agents Other (See Comments)    Itching (severe) and chest tightness  . Penicillins Anaphylaxis, Swelling and Rash    Has patient had a PCN reaction causing immediate rash, facial/tongue/throat swelling, SOB or lightheadedness with hypotension: Yes Has patient had a PCN reaction causing severe rash involving mucus membranes or skin necrosis: Unknown Has patient had a PCN reaction that required hospitalization: Unknown Has patient had a PCN reaction occurring within the last 10 years: No If all of the above answers are "NO", then may proceed with Cephalosporin use.   Marland Kitchen Anectine [Succinylcholine]     Brother went into cardiac arrest.  . Codeine Nausea Only  . Gabapentin     Gait abnormality  . Influenza Vaccines     Muscle weakness; unable to walk  . Nortriptyline     Eye swelling and mouth drawed up  . Pamelor [Nortriptyline Hcl]     Patient states caused her face to draw in together.  . Valsartan     Allergy to generic only Other reaction(s): Cough "Hacking" cough  . Erythromycin Rash and Swelling  . Sulfa Antibiotics Rash   Family History  Problem Relation Age of Onset  . CAD Mother 3       MI, aortic valve issues  . COPD Mother   . Lupus Mother   . Graves' disease Mother   . Rheum arthritis Mother   . CAD Father 76       CABG x2, aortic valve replacement  . Stroke Sister   . Alcohol abuse Brother   . CAD Brother 83       MI  . Stroke Brother   . Seizures Son   . COPD Brother   .  CAD Brother 97       stent  . Diabetes Brother   . Depression Grandchild   . Cancer Maternal Aunt        breast  . Breast cancer Maternal Aunt   . Diabetes Sister        deceased  . CAD Sister 79       stents  . Breast cancer Maternal Aunt    PE: BP (!) 150/70   Pulse 72   Ht 5' 3.5" (1.613 m)   Wt 153 lb (69.4 kg)   SpO2 98%  BMI 26.68 kg/m   Wt Readings from Last 3 Encounters:  07/05/18 153 lb (69.4 kg)  07/03/18 151 lb (68.5 kg)  06/19/18 155 lb 4 oz (70.4 kg)   Constitutional:normal weight, in NAD Eyes: PERRLA, EOMI, no exophthalmos ENT: moist mucous membranes, no thyromegaly, no cervical lymphadenopathy Cardiovascular: RRR, No MRG Respiratory: CTA B Gastrointestinal: abdomen soft, NT, ND, BS+ Musculoskeletal: no deformities, strength intact in all 4 Skin: moist, warm, no rashes Neurological: no tremor with outstretched hands, DTR normal in all 4  ASSESSMENT: 1. DM1, uncontrolled, without long-term complications, but with hyperglycemia  2.  Hashimoto's hypothyroidism  PLAN:  1. Patient with long-standing, uncontrolled, LADA, on insulin pump and previously on Glucophage ER DAW which stopped being covered by her insurance.  She cannot use the metformin ER generic so she is now off metformin altogether. -We reviewed together her pump downloads and discussed about the fact that she will need to change her insulin site every 3 to 4 days, rather than every 5 to 6 days to avoid development of scar tissue and unpredictable blood sugars beyond day 4.  I advised her to change the site every Sundays and Wednesdays.  I also noticed that she did not enter carbs and check sugars as frequently as before and did more blind/correction boluses, without using the bolus wizard.  We discussed that this is not conducive to good control.  She had a more stressed.  Since last visit with several courses of prednisone and new medical issues.  She is ready to start checking sugars more often and  enter carbs more consistently after her carotid surgery, which is scheduled for 2 weeks from now. -Her sugars are acceptable at the beginning of the day, they are at goal in the middle of the day, but they are very high at bedtime.  We only have 2 values in the last 2 weeks, but they are both very high.  In one situation she did not bolus the entire day, while in the other one she did not use the bolus wizard.  Also, during the times when she had prednisone her sugars were quite high and she did not decrease her insulin to carb ratio for these days, as we discussed.  We again discussed that sugars usually increase after meals while on prednisone and I gave her again directions about how to change her insulin to carb ratio if she needs to take steroids again. -Otherwise, I did not change her pump settings.  Specifically, we will continue her very low basal rate between 9:30 AM and 5 PM, since her sugars are best around these times. - last HbA1c reviewed: 8.1% - higher - I advised her to:  Patient Instructions  Please continue: - basal rates: 12 am: 0.800 units/h 3 am: 0.875 9:30 am: 0.100 5 pm: 1.500 6:30 pm: 1.300 11:30 pm: 1.800  - ICR:   12 am: 10 (8 if on prednisone) 6 pm: 8 (6 if on Prednisone) - target: 130-130 except 6-8 am and 6 pm-12 am: 100-100 - ISF: 35 - Insulin on Board: 4h  Please continue: - Synthroid 75 mcg 75 mcg 6/7 days, 37.5 mcg 1/7 days   Take the thyroid hormone every day, with water, at least 30 minutes before breakfast, separated by at least 4 hours from: - acid reflux medications - calcium - iron - multivitamins  Please return in 4 months.  - continue checking sugars at different times of the day - check 4x a day, rotating checks -  advised for yearly eye exams >> she is UTD - refuses flu shot 2/2 previous intolerance - Return to clinic in 4 mo with sugar log   2.  Hashimoto's hypothyroidism - latest thyroid labs reviewed with pt >> normal 11/2017 - she  continues on LT4 75 mcg 6/7 days, 37.5 mcg 1/7 days  - pt feels good on this dose. - we discussed about taking the thyroid hormone every day, with water, >30 minutes before breakfast, separated by >4 hours from acid reflux medications, calcium, iron, multivitamins. Pt. is taking it correctly. - will check thyroid tests at next visit  - time spent with the patient: 25 min, of which >50% was spent in reviewing her pump downloads, discussing her hypo- and hyper-glycemic episodes, reviewing previous labs and pump settings and developing a plan to avoid hypo- and hyper-glycemia.    Philemon Kingdom, MD PhD [facility] Endocrinology

## 2018-07-05 NOTE — Patient Instructions (Addendum)
Please continue: - basal rates: 12 am: 0.800 units/h 3 am: 0.875 9:30 am: 0.100 5 pm: 1.500 6:30 pm: 1.300 11:30 pm: 1.800  - ICR:   12 am: 10 (8 if on prednisone) 6 pm: 8 (6 if on Prednisone) - target: 130-130 except 6-8 am and 6 pm-12 am: 100-100 - ISF: 35 - Insulin on Board: 4h  Please change the infusion site on Sundays and Wednesdays.  Please do the following approximately 15 minutes before every meal: - Enter carbs (C) - Enter sugars (S) - Start insulin bolus (I)  Please continue: - Synthroid 75 mcg 75 mcg 6/7 days, 37.5 mcg 1/7 days   Take the thyroid hormone every day, with water, at least 30 minutes before breakfast, separated by at least 4 hours from: - acid reflux medications - calcium - iron - multivitamins  Please return in 4 months.

## 2018-07-09 ENCOUNTER — Telehealth (INDEPENDENT_AMBULATORY_CARE_PROVIDER_SITE_OTHER): Payer: Self-pay

## 2018-07-09 NOTE — Telephone Encounter (Signed)
Patient called in this morning requesting that the carotid stent placement she is scheduled for be canceled with Dr. Delana Meyer. No reason was given.

## 2018-07-12 DIAGNOSIS — E109 Type 1 diabetes mellitus without complications: Secondary | ICD-10-CM | POA: Diagnosis not present

## 2018-07-13 NOTE — Pre-Procedure Instructions (Addendum)
Cassandra Benson  07/13/2018      [id number] [place], [place] - [place] AT [place] [address] [place] [address] Phone: [phone] Fax: [phone]    Your procedure is scheduled on October 28th.  Report to [facility] Admitting at [place].M.  Call this number if you have problems the morning of surgery:  [phone]   Remember:  Do not eat or drink after midnight.    Take these medicines the morning of surgery with A SIP OF WATER   Crestor  Carafate (if needed)  Synthroid   Follow your surgeon's instructions for when to stop/resume Plavix.   7 days prior to surgery STOP taking any Voltaren Gel, Aspirin(unless otherwise instructed by your surgeon), Aleve, Naproxen, Ibuprofen, Motrin, Advil, Goody's, BC's, all herbal medications, fish oil, and all vitamins   WHAT DO I DO ABOUT MY DIABETES MEDICATION?   Marland Kitchen Do not take oral diabetes medicines (pills) the morning of surgery.  . Reduce you basal rate by 20% at midnight.  . The day of surgery, do not take other diabetes injectables, including Byetta (exenatide), Bydureon (exenatide ER), Victoza (liraglutide), or Trulicity (dulaglutide).  . If your CBG is greater than 220 mg/dL, you may take  of your sliding scale (correction) dose of insulin.   How to Manage Your Diabetes Before and After Surgery  Why is it important to control my blood sugar before and after surgery? . Improving blood sugar levels before and after surgery helps healing and can limit problems. . A way of improving blood sugar control is eating a healthy diet by: o  Eating less sugar and carbohydrates o  Increasing activity/exercise o  Talking with your doctor about reaching your blood sugar goals . High blood sugars (greater than 180 mg/dL) can raise your risk of infections and slow your recovery, so you will need to focus on controlling your diabetes during  the weeks before surgery. . Make sure that the doctor who takes care of your diabetes knows about your planned surgery including the date and location.  How do I manage my blood sugar before surgery? . Check your blood sugar at least 4 times a day, starting 2 days before surgery, to make sure that the level is not too high or low. o Check your blood sugar the morning of your surgery when you wake up and every 2 hours until you get to the Short Stay unit. . If your blood sugar is less than 70 mg/dL, you will need to treat for low blood sugar: o Do not take insulin. o Treat a low blood sugar (less than 70 mg/dL) with  cup of clear juice (cranberry or apple), 4 glucose tablets, OR glucose gel. o Recheck blood sugar in 15 minutes after treatment (to make sure it is greater than 70 mg/dL). If your blood sugar is not greater than 70 mg/dL on recheck, call [phone] for further instructions. . Report your blood sugar to the short stay nurse when you get to Short Stay.  . If you are admitted to the hospital after surgery: o Your blood sugar will be checked by the staff and you will probably be given insulin after surgery (instead of oral diabetes medicines) to make sure you have good blood sugar levels. o The goal for blood sugar control after surgery is 80-180 mg/dL.     Do not wear jewelry, make-up or nail  polish.  Do not wear lotions, powders, or perfumes, or deodorant.  Do not shave 48 hours prior to surgery.  Men may shave face and neck.  Do not bring valuables to the hospital.  [facility] is not responsible for any belongings or valuables.  Contacts, dentures or bridgework may not be worn into surgery.  Leave your suitcase in the car.  After surgery it may be brought to your room.  For patients admitted to the hospital, discharge time will be determined by your treatment team.  Patients discharged the day of surgery will not be allowed to drive home.    [place]- Preparing For  Surgery  Before surgery, you can play an important role. Because skin is not sterile, your skin needs to be as free of germs as possible. You can reduce the number of germs on your skin by washing with CHG (chlorahexidine gluconate) Soap before surgery.  CHG is an antiseptic cleaner which kills germs and bonds with the skin to continue killing germs even after washing.    Oral Hygiene is also important to reduce your risk of infection.  Remember - BRUSH YOUR TEETH THE MORNING OF SURGERY WITH YOUR REGULAR TOOTHPASTE  Please do not use if you have an allergy to CHG or antibacterial soaps. If your skin becomes reddened/irritated stop using the CHG.  Do not shave (including legs and underarms) for at least 48 hours prior to first CHG shower. It is OK to shave your face.  Please follow these instructions carefully.   1. Shower the NIGHT BEFORE SURGERY and the MORNING OF SURGERY with CHG.   2. If you chose to wash your hair, wash your hair first as usual with your normal shampoo.  3. After you shampoo, rinse your hair and body thoroughly to remove the shampoo.  4. Use CHG as you would any other liquid soap. You can apply CHG directly to the skin and wash gently with a scrungie or a clean washcloth.   5. Apply the CHG Soap to your body ONLY FROM THE NECK DOWN.  Do not use on open wounds or open sores. Avoid contact with your eyes, ears, mouth and genitals (private parts). Wash Face and genitals (private parts)  with your normal soap.  6. Wash thoroughly, paying special attention to the area where your surgery will be performed.  7. Thoroughly rinse your body with warm water from the neck down.  8. DO NOT shower/wash with your normal soap after using and rinsing off the CHG Soap.  9. Pat yourself dry with a CLEAN TOWEL.  10. Wear CLEAN PAJAMAS to bed the night before surgery, wear comfortable clothes the morning of surgery  11. Place CLEAN SHEETS on your bed the night of your first shower and  DO NOT SLEEP WITH PETS.    Day of Surgery:  Do not apply any deodorants/lotions.  Please wear clean clothes to the hospital/surgery center.   Remember to brush your teeth WITH YOUR REGULAR TOOTHPASTE.    Please read over the following fact sheets that you were given.

## 2018-07-16 ENCOUNTER — Other Ambulatory Visit: Payer: Self-pay

## 2018-07-16 ENCOUNTER — Encounter (HOSPITAL_COMMUNITY): Payer: Self-pay

## 2018-07-16 ENCOUNTER — Encounter (HOSPITAL_COMMUNITY)
Admission: RE | Admit: 2018-07-16 | Discharge: 2018-07-16 | Disposition: A | Discharge status: Home / Self Care | Payer: PPO | Source: Ambulatory Visit | Attending: Vascular Surgery | Admitting: Vascular Surgery

## 2018-07-16 DIAGNOSIS — Z01812 Encounter for preprocedural laboratory examination: Secondary | ICD-10-CM | POA: Insufficient documentation

## 2018-07-16 DIAGNOSIS — E119 Type 2 diabetes mellitus without complications: Secondary | ICD-10-CM | POA: Insufficient documentation

## 2018-07-16 DIAGNOSIS — Z86718 Personal history of other venous thrombosis and embolism: Secondary | ICD-10-CM | POA: Diagnosis not present

## 2018-07-16 HISTORY — DX: Sjogren syndrome, unspecified: M35.00

## 2018-07-16 HISTORY — DX: Other overlap syndromes: M35.1

## 2018-07-16 HISTORY — DX: Raynaud's syndrome without gangrene: I73.00

## 2018-07-16 HISTORY — DX: Other dermatomyositis without myopathy: M33.13

## 2018-07-16 HISTORY — DX: Other specified abnormal immunological findings in serum: R76.8

## 2018-07-16 HISTORY — DX: Other specified abnormal immunological findings in serum: R76.89

## 2018-07-16 HISTORY — DX: Dermatopolymyositis, unspecified, organ involvement unspecified: M33.90

## 2018-07-16 HISTORY — DX: Systemic sclerosis, unspecified: M34.9

## 2018-07-16 LAB — COMPREHENSIVE METABOLIC PANEL
ALBUMIN: 3.7 g/dL (ref 3.5–5.0)
ALT: 13 U/L (ref 0–44)
ANION GAP: 8 (ref 5–15)
AST: 17 U/L (ref 15–41)
Alkaline Phosphatase: 71 U/L (ref 38–126)
BUN: 11 mg/dL (ref 8–23)
CHLORIDE: 110 mmol/L (ref 98–111)
CO2: 21 mmol/L — AB (ref 22–32)
Calcium: 9.3 mg/dL (ref 8.9–10.3)
Creatinine, Ser: 0.9 mg/dL (ref 0.44–1.00)
GFR calc Af Amer: >60 mL/min (ref >60)
GFR calc non Af Amer: >60 mL/min (ref >60)
GLUCOSE: 127 mg/dL — AB (ref 70–99)
POTASSIUM: 3.9 mmol/L (ref 3.5–5.1)
SODIUM: 139 mmol/L (ref 135–145)
Total Bilirubin: 0.7 mg/dL (ref 0.3–1.2)
Total Protein: 6.5 g/dL (ref 6.5–8.1)

## 2018-07-16 LAB — URINALYSIS, ROUTINE W REFLEX MICROSCOPIC
BILIRUBIN URINE: NEGATIVE
GLUCOSE, UA: NEGATIVE mg/dL
HGB URINE DIPSTICK: NEGATIVE
Ketones, ur: NEGATIVE mg/dL
Leukocytes, UA: NEGATIVE
Nitrite: NEGATIVE
PROTEIN: NEGATIVE mg/dL
Specific Gravity, Urine: 1.015 (ref 1.005–1.030)
pH: 6 (ref 5.0–8.0)

## 2018-07-16 LAB — CBC
HCT: 44.6 % (ref 36.0–46.0)
Hemoglobin: 13.6 g/dL (ref 12.0–15.0)
MCH: 25.1 pg — ABNORMAL LOW (ref 26.0–34.0)
MCHC: 30.5 g/dL (ref 30.0–36.0)
MCV: 82.3 fL (ref 80.0–100.0)
PLATELETS: 243 10*3/uL (ref 150–400)
RBC: 5.42 MIL/uL — ABNORMAL HIGH (ref 3.87–5.11)
RDW: 13.6 % (ref 11.5–15.5)
WBC: 7.9 10*3/uL (ref 4.0–10.5)
nRBC: 0 % (ref 0.0–0.2)

## 2018-07-16 LAB — BLOOD GAS, ARTERIAL
ACID-BASE DEFICIT: 0.6 mmol/L (ref 0.0–2.0)
Bicarbonate: 22.9 mmol/L (ref 20.0–28.0)
DRAWN BY: 470591
FIO2: 21
O2 SAT: 99.3 %
PATIENT TEMPERATURE: 98.6
pCO2 arterial: 33.7 mmHg (ref 32.0–48.0)
pH, Arterial: 7.448 (ref 7.350–7.450)
pO2, Arterial: 160 mmHg — ABNORMAL HIGH (ref 83.0–108.0)

## 2018-07-16 LAB — PROTIME-INR
INR: 1.04
Prothrombin Time: 13.5 seconds (ref 11.4–15.2)

## 2018-07-16 LAB — SURGICAL PCR SCREEN
MRSA, PCR: NEGATIVE
Staphylococcus aureus: NEGATIVE

## 2018-07-16 LAB — GLUCOSE, CAPILLARY: Glucose-Capillary: 103 mg/dL — ABNORMAL HIGH (ref 70–99)

## 2018-07-16 LAB — TYPE AND SCREEN
ABO/RH(D): A POS
ANTIBODY SCREEN: NEGATIVE

## 2018-07-16 LAB — APTT: aPTT: 30 seconds (ref 24–36)

## 2018-07-16 LAB — ABO/RH: ABO/RH(D): A POS

## 2018-07-16 NOTE — Pre-Procedure Instructions (Signed)
Cassandra Benson  07/16/2018      [id number] Lorina Rabon, [place] - [place] AT [place] [address] [place] [address] Phone: [phone] Fax: [phone]    Your procedure is scheduled on October 28th.  Report to [facility] Admitting at [place].M.  Call this number if you have problems the morning of surgery:  [phone]   Remember:  Do not eat or drink after midnight.    Take these medicines the morning of surgery with A SIP OF WATER   Rosuvastatin Crestor  Carafate (if needed)  Synthroid   Follow your surgeon's instructions for when to stop/resume Plavix.   7 days prior to surgery STOP taking any Voltaren Gel, Aspirin(unless otherwise instructed by your surgeon), Aleve, Naproxen, Ibuprofen, Motrin, Advil, Goody's, BC's, all herbal medications, fish oil, and all vitamins   WHAT DO I DO ABOUT MY DIABETES MEDICATION?   . Reduce you basal rate by 20% at midnight.   How to Manage Your Diabetes Before and After Surgery  Why is it important to control my blood sugar before and after surgery? . Improving blood sugar levels before and after surgery helps healing and can limit problems. . A way of improving blood sugar control is eating a healthy diet by: o  Eating less sugar and carbohydrates o  Increasing activity/exercise o  Talking with your doctor about reaching your blood sugar goals . High blood sugars (greater than 180 mg/dL) can raise your risk of infections and slow your recovery, so you will need to focus on controlling your diabetes during the weeks before surgery. . Make sure that the doctor who takes care of your diabetes knows about your planned surgery including the date and location.  How do I manage my blood sugar before surgery? . Check your blood sugar at least 4 times a day, starting 2 days before surgery, to make sure that the level is not too high or  low. o Check your blood sugar the morning of your surgery when you wake up and every 2 hours until you get to the Short Stay unit. . If your blood sugar is less than 70 mg/dL, you will need to treat for low blood sugar: o Do not take insulin. o Treat a low blood sugar (less than 70 mg/dL) with  cup of clear juice (cranberry or apple), 4 glucose tablets, OR glucose gel. o Recheck blood sugar in 15 minutes after treatment (to make sure it is greater than 70 mg/dL). If your blood sugar is not greater than 70 mg/dL on recheck, call [phone] for further instructions. . Report your blood sugar to the short stay nurse when you get to Short Stay.  . If you are admitted to the hospital after surgery: o Your blood sugar will be checked by the staff and you will probably be given insulin after surgery (instead of oral diabetes medicines) to make sure you have good blood sugar levels. o The goal for blood sugar control after surgery is 80-180 mg/dL.     Do not wear jewelry, make-up or nail polish.  Do not wear lotions, powders, or perfumes, or deodorant.  Do not shave 48 hours prior to surgery.  Men may shave face and neck.  Do not bring valuables to the hospital.  [facility] is not responsible for any belongings or valuables.  Contacts, dentures or bridgework may not be worn into surgery.  Leave your suitcase in the car.  After surgery it may be brought to your room.  For patients admitted to the hospital, discharge time will be determined by your treatment team.  Patients discharged the day of surgery will not be allowed to drive home.    [place]- Preparing For Surgery  Before surgery, you can play an important role. Because skin is not sterile, your skin needs to be as free of germs as possible. You can reduce the number of germs on your skin by washing with CHG (chlorahexidine gluconate) Soap before surgery.  CHG is an antiseptic cleaner which kills germs and bonds with the skin to  continue killing germs even after washing.    Oral Hygiene is also important to reduce your risk of infection.  Remember - BRUSH YOUR TEETH THE MORNING OF SURGERY WITH YOUR REGULAR TOOTHPASTE  Please do not use if you have an allergy to CHG or antibacterial soaps. If your skin becomes reddened/irritated stop using the CHG.  Do not shave (including legs and underarms) for at least 48 hours prior to first CHG shower. It is OK to shave your face.  Please follow these instructions carefully.   1. Shower the NIGHT BEFORE SURGERY and the MORNING OF SURGERY with CHG.   2. If you chose to wash your hair, wash your hair first as usual with your normal shampoo.  3. After you shampoo, rinse your hair and body thoroughly to remove the shampoo.  4. Use CHG as you would any other liquid soap. You can apply CHG directly to the skin and wash gently with a scrungie or a clean washcloth.   5. Apply the CHG Soap to your body ONLY FROM THE NECK DOWN.  Do not use on open wounds or open sores. Avoid contact with your eyes, ears, mouth and genitals (private parts). Wash Face and genitals (private parts)  with your normal soap.  6. Wash thoroughly, paying special attention to the area where your surgery will be performed.  7. Thoroughly rinse your body with warm water from the neck down.  8. DO NOT shower/wash with your normal soap after using and rinsing off the CHG Soap.  9. Pat yourself dry with a CLEAN TOWEL.  10. Wear CLEAN PAJAMAS to bed the night before surgery, wear comfortable clothes the morning of surgery  11. Place CLEAN SHEETS on your bed the night of your first shower and DO NOT SLEEP WITH PETS.    Day of Surgery:  Do not apply any deodorants/lotions.  Please wear clean clothes to the hospital/surgery center.   Remember to brush your teeth WITH YOUR REGULAR TOOTHPASTE.    Please read over the following fact sheets that you were given.

## 2018-07-16 NOTE — Progress Notes (Signed)
PCP - Dr. Philip Aspen Cardiologist - Dr. Rockey Situ  Chest x-ray - 01/27/18 EKG - 01/31/18 Stress Test -10+ years  ECHO - 2019 Cardiac Cath -2000   Sleep Study - positive sleep study in past; denies use of CPAP  Fasting Blood Sugar - 120-130 Checks Blood Sugar 3 times a day  Aspirin Instructions: ASA and plavix, per pt, was instructed to continue both and take on DOS.   Anesthesia review: Yes, last A1C was 8.1 on 06/11/18  Patient denies shortness of breath, fever, cough and chest pain at PAT appointment   Patient verbalized understanding of instructions that were given to them at the PAT appointment. Patient was also instructed that they will need to review over the PAT instructions again at home before surgery.  \

## 2018-07-17 ENCOUNTER — Ambulatory Visit: Payer: PPO | Admitting: Nurse Practitioner

## 2018-07-17 ENCOUNTER — Telehealth: Payer: Self-pay | Admitting: Family Medicine

## 2018-07-17 ENCOUNTER — Encounter

## 2018-07-17 NOTE — Telephone Encounter (Signed)
I do think she needs to take full dose plavix.  Would she be willing to try if we add acid reducer medicine.  If she agrees, please send pantoprazole 40mg  one PO daily #30 with RF 3 to her local pharmacy.

## 2018-07-17 NOTE — Telephone Encounter (Signed)
Copied from [place] [id number]. Topic: General - Other >> Jul 17, 2018  9:06 AM Keene Breath wrote: Reason for CRM: Patient called to inform the doctor that she is going to have vascular surgery on Monday morning and the Plavix medication that she is currently taking is making her stomach burn.  She wants to know if she should continue to take this medication or maybe take half of the medication before her surgery.  Please advise.  CB# Cell- [phone] or [phone]

## 2018-07-18 MED ORDER — PANTOPRAZOLE SODIUM 40 MG PO TBEC: 40.0000 mg | DELAYED_RELEASE_TABLET | Freq: Every day | ORAL | 3 refills | Status: DC

## 2018-07-18 NOTE — Addendum Note (Signed)
Addended by: Brenton Grills on: 82/50/5397 01:32 PM   Modules accepted: Orders

## 2018-07-18 NOTE — Telephone Encounter (Signed)
Spoke with pt relaying Dr. Synthia Innocent message.  Pt verbalizes understanding and agrees to taking pantoprazole 40 mg daily. Notified her rx was sent.   Sent refill, per Dr. Darnell Level, to [facility]

## 2018-07-19 DIAGNOSIS — E109 Type 1 diabetes mellitus without complications: Secondary | ICD-10-CM | POA: Diagnosis not present

## 2018-07-22 NOTE — Anesthesia Preprocedure Evaluation (Addendum)
Anesthesia Evaluation  Patient identified by MRN, date of birth, ID band Patient awake    Reviewed: Allergy & Precautions, NPO status , Patient's Chart, lab work & pertinent test results  History of Anesthesia Complications (+) PONV, Family history of anesthesia reaction and history of anesthetic complications (brother had cardiac arrest with succinylcholine in distant past, no fever)  Airway Mallampati: II  TM Distance: >3 FB Neck ROM: Full    Dental  (+) Dental Advisory Given, Teeth Intact   Pulmonary sleep apnea (does not use cpap) ,    breath sounds clear to auscultation       Cardiovascular hypertension, Pt. on medications (-) angina+ Peripheral Vascular Disease (carotid stenosis)   Rhythm:Regular Rate:Normal  1/19 ECHO: EF 60-65%, valves OK   Neuro/Psych  Headaches, Anxiety Depression    GI/Hepatic Neg liver ROS, GERD  Medicated and Controlled,  Endo/Other  diabetes (insulin pump, glu 157), Insulin DependentHypothyroidism SLE Sjogren's  Raynaud's  Renal/GU negative Renal ROS     Musculoskeletal  (+) Arthritis , Osteoarthritis,  Fibromyalgia -  Abdominal (+) - obese,   Peds  Hematology negative hematology ROS (+)   Anesthesia Other Findings   Reproductive/Obstetrics                            Anesthesia Physical Anesthesia Plan  ASA: III  Anesthesia Plan: General   Post-op Pain Management:    Induction: Intravenous  PONV Risk Score and Plan: 4 or greater and Dexamethasone, Ondansetron and Treatment may vary due to age or medical condition  Airway Management Planned: Oral ETT  Additional Equipment: Arterial line  Intra-op Plan:   Post-operative Plan: Extubation in OR  Informed Consent: I have reviewed the patients History and Physical, chart, labs and discussed the procedure including the risks, benefits and alternatives for the proposed anesthesia with the patient or  authorized representative who has indicated his/her understanding and acceptance.   Dental advisory given  Plan Discussed with: CRNA and Surgeon  Anesthesia Plan Comments: (Plan routine monitors, A line, GETA  Avoiding Sux)        Anesthesia Quick Evaluation

## 2018-07-23 ENCOUNTER — Other Ambulatory Visit: Payer: Self-pay

## 2018-07-23 ENCOUNTER — Encounter (HOSPITAL_COMMUNITY)
Admission: RE | Disposition: A | Discharge status: Home / Self Care | Payer: Self-pay | Source: Home / Self Care | Attending: Vascular Surgery

## 2018-07-23 ENCOUNTER — Telehealth: Payer: Self-pay | Admitting: Vascular Surgery

## 2018-07-23 ENCOUNTER — Inpatient Hospital Stay (HOSPITAL_COMMUNITY)
Admission: RE | Admit: 2018-07-23 | Discharge: 2018-07-24 | DRG: 036 | Disposition: A | Discharge status: Home / Self Care | Payer: PPO | Attending: Vascular Surgery | Admitting: Vascular Surgery

## 2018-07-23 ENCOUNTER — Encounter (HOSPITAL_COMMUNITY): Payer: Self-pay

## 2018-07-23 ENCOUNTER — Inpatient Hospital Stay (HOSPITAL_COMMUNITY): Payer: PPO | Admitting: Anesthesiology

## 2018-07-23 ENCOUNTER — Inpatient Hospital Stay (HOSPITAL_COMMUNITY): Payer: PPO | Admitting: Physician Assistant

## 2018-07-23 DIAGNOSIS — E063 Autoimmune thyroiditis: Secondary | ICD-10-CM | POA: Diagnosis present

## 2018-07-23 DIAGNOSIS — Z7902 Long term (current) use of antithrombotics/antiplatelets: Secondary | ICD-10-CM

## 2018-07-23 DIAGNOSIS — M359 Systemic involvement of connective tissue, unspecified: Secondary | ICD-10-CM | POA: Diagnosis present

## 2018-07-23 DIAGNOSIS — E119 Type 2 diabetes mellitus without complications: Secondary | ICD-10-CM | POA: Diagnosis present

## 2018-07-23 DIAGNOSIS — Z79899 Other long term (current) drug therapy: Secondary | ICD-10-CM

## 2018-07-23 DIAGNOSIS — E559 Vitamin D deficiency, unspecified: Secondary | ICD-10-CM | POA: Diagnosis present

## 2018-07-23 DIAGNOSIS — Z79818 Long term (current) use of other agents affecting estrogen receptors and estrogen levels: Secondary | ICD-10-CM

## 2018-07-23 DIAGNOSIS — Z79891 Long term (current) use of opiate analgesic: Secondary | ICD-10-CM

## 2018-07-23 DIAGNOSIS — K760 Fatty (change of) liver, not elsewhere classified: Secondary | ICD-10-CM | POA: Diagnosis present

## 2018-07-23 DIAGNOSIS — Z90710 Acquired absence of both cervix and uterus: Secondary | ICD-10-CM

## 2018-07-23 DIAGNOSIS — Z791 Long term (current) use of non-steroidal anti-inflammatories (NSAID): Secondary | ICD-10-CM

## 2018-07-23 DIAGNOSIS — Z823 Family history of stroke: Secondary | ICD-10-CM | POA: Diagnosis not present

## 2018-07-23 DIAGNOSIS — E785 Hyperlipidemia, unspecified: Secondary | ICD-10-CM | POA: Diagnosis not present

## 2018-07-23 DIAGNOSIS — Z811 Family history of alcohol abuse and dependence: Secondary | ICD-10-CM

## 2018-07-23 DIAGNOSIS — I6522 Occlusion and stenosis of left carotid artery: Principal | ICD-10-CM | POA: Diagnosis present

## 2018-07-23 DIAGNOSIS — I10 Essential (primary) hypertension: Secondary | ICD-10-CM | POA: Diagnosis not present

## 2018-07-23 DIAGNOSIS — Z885 Allergy status to narcotic agent status: Secondary | ICD-10-CM

## 2018-07-23 DIAGNOSIS — Z8701 Personal history of pneumonia (recurrent): Secondary | ICD-10-CM

## 2018-07-23 DIAGNOSIS — Z833 Family history of diabetes mellitus: Secondary | ICD-10-CM

## 2018-07-23 DIAGNOSIS — M3500 Sicca syndrome, unspecified: Secondary | ICD-10-CM | POA: Diagnosis not present

## 2018-07-23 DIAGNOSIS — G35 Multiple sclerosis: Secondary | ICD-10-CM | POA: Diagnosis not present

## 2018-07-23 DIAGNOSIS — Z887 Allergy status to serum and vaccine status: Secondary | ICD-10-CM

## 2018-07-23 DIAGNOSIS — M797 Fibromyalgia: Secondary | ICD-10-CM | POA: Diagnosis present

## 2018-07-23 DIAGNOSIS — I7300 Raynaud's syndrome without gangrene: Secondary | ICD-10-CM | POA: Diagnosis not present

## 2018-07-23 DIAGNOSIS — Z803 Family history of malignant neoplasm of breast: Secondary | ICD-10-CM

## 2018-07-23 DIAGNOSIS — Z7982 Long term (current) use of aspirin: Secondary | ICD-10-CM

## 2018-07-23 DIAGNOSIS — H4089 Other specified glaucoma: Secondary | ICD-10-CM | POA: Diagnosis not present

## 2018-07-23 DIAGNOSIS — Z881 Allergy status to other antibiotic agents status: Secondary | ICD-10-CM

## 2018-07-23 DIAGNOSIS — Z8673 Personal history of transient ischemic attack (TIA), and cerebral infarction without residual deficits: Secondary | ICD-10-CM | POA: Diagnosis not present

## 2018-07-23 DIAGNOSIS — K219 Gastro-esophageal reflux disease without esophagitis: Secondary | ICD-10-CM | POA: Diagnosis not present

## 2018-07-23 DIAGNOSIS — M1990 Unspecified osteoarthritis, unspecified site: Secondary | ICD-10-CM | POA: Diagnosis not present

## 2018-07-23 DIAGNOSIS — M349 Systemic sclerosis, unspecified: Secondary | ICD-10-CM | POA: Diagnosis present

## 2018-07-23 DIAGNOSIS — Z794 Long term (current) use of insulin: Secondary | ICD-10-CM

## 2018-07-23 DIAGNOSIS — F418 Other specified anxiety disorders: Secondary | ICD-10-CM | POA: Diagnosis not present

## 2018-07-23 DIAGNOSIS — Z8249 Family history of ischemic heart disease and other diseases of the circulatory system: Secondary | ICD-10-CM

## 2018-07-23 DIAGNOSIS — Z832 Family history of diseases of the blood and blood-forming organs and certain disorders involving the immune mechanism: Secondary | ICD-10-CM

## 2018-07-23 DIAGNOSIS — Z882 Allergy status to sulfonamides status: Secondary | ICD-10-CM

## 2018-07-23 DIAGNOSIS — M329 Systemic lupus erythematosus, unspecified: Secondary | ICD-10-CM | POA: Diagnosis not present

## 2018-07-23 DIAGNOSIS — I739 Peripheral vascular disease, unspecified: Secondary | ICD-10-CM | POA: Diagnosis present

## 2018-07-23 DIAGNOSIS — Z818 Family history of other mental and behavioral disorders: Secondary | ICD-10-CM

## 2018-07-23 DIAGNOSIS — Z96641 Presence of right artificial hip joint: Secondary | ICD-10-CM

## 2018-07-23 DIAGNOSIS — Z880 Allergy status to penicillin: Secondary | ICD-10-CM

## 2018-07-23 DIAGNOSIS — Z888 Allergy status to other drugs, medicaments and biological substances status: Secondary | ICD-10-CM

## 2018-07-23 DIAGNOSIS — Z825 Family history of asthma and other chronic lower respiratory diseases: Secondary | ICD-10-CM

## 2018-07-23 DIAGNOSIS — Z91041 Radiographic dye allergy status: Secondary | ICD-10-CM

## 2018-07-23 DIAGNOSIS — Z8261 Family history of arthritis: Secondary | ICD-10-CM

## 2018-07-23 HISTORY — PX: TRANSCAROTID ARTERY REVASCULARIZATIONÂ: SHX6778

## 2018-07-23 HISTORY — DX: Nausea with vomiting, unspecified: Z98.890

## 2018-07-23 HISTORY — DX: Nausea with vomiting, unspecified: R11.2

## 2018-07-23 LAB — POCT I-STAT 4, (NA,K, GLUC, HGB,HCT)
Glucose, Bld: 212 mg/dL — ABNORMAL HIGH (ref 70–99)
Glucose, Bld: 209 mg/dL — ABNORMAL HIGH (ref 70–99)
HEMATOCRIT: 28 % — AB (ref 36.0–46.0)
HEMATOCRIT: 29 % — AB (ref 36.0–46.0)
Hemoglobin: 9.5 g/dL — ABNORMAL LOW (ref 12.0–15.0)
Hemoglobin: 9.9 g/dL — ABNORMAL LOW (ref 12.0–15.0)
Potassium: 3.2 mmol/L — ABNORMAL LOW (ref 3.5–5.1)
Potassium: 3.4 mmol/L — ABNORMAL LOW (ref 3.5–5.1)
SODIUM: 141 mmol/L (ref 135–145)
Sodium: 142 mmol/L (ref 135–145)

## 2018-07-23 LAB — GLUCOSE, CAPILLARY
GLUCOSE-CAPILLARY: 206 mg/dL — AB (ref 70–99)
GLUCOSE-CAPILLARY: 227 mg/dL — AB (ref 70–99)
GLUCOSE-CAPILLARY: 265 mg/dL — AB (ref 70–99)
GLUCOSE-CAPILLARY: 285 mg/dL — AB (ref 70–99)
Glucose-Capillary: 157 mg/dL — ABNORMAL HIGH (ref 70–99)
Glucose-Capillary: 175 mg/dL — ABNORMAL HIGH (ref 70–99)
Glucose-Capillary: 155 mg/dL — ABNORMAL HIGH (ref 70–99)
Glucose-Capillary: 285 mg/dL — ABNORMAL HIGH (ref 70–99)
Glucose-Capillary: 280 mg/dL — ABNORMAL HIGH (ref 70–99)

## 2018-07-23 LAB — POCT ACTIVATED CLOTTING TIME
ACTIVATED CLOTTING TIME: 257 s
Activated Clotting Time: 180 seconds

## 2018-07-23 SURGERY — TRANSCAROTID ARTERY REVASCULARIZATION (TCAR)
Anesthesia: General | Laterality: Left

## 2018-07-23 MED ORDER — SODIUM CHLORIDE 0.9 % IV SOLN
0.5000 [IU]/h | INTRAVENOUS | Status: DC
  Filled 2018-07-23: qty 1

## 2018-07-23 MED ORDER — DOCUSATE SODIUM 100 MG PO CAPS
100.0000 mg | ORAL_CAPSULE | Freq: Every day | ORAL | Status: DC
  Filled 2018-07-23: qty 1

## 2018-07-23 MED ORDER — MORPHINE SULFATE (PF) 2 MG/ML IV SOLN: 1.0000 mg | INTRAVENOUS | Status: DC | PRN

## 2018-07-23 MED ORDER — FENTANYL CITRATE (PF) 250 MCG/5ML IJ SOLN
INTRAMUSCULAR | Status: AC
  Filled 2018-07-23: qty 5

## 2018-07-23 MED ORDER — ALUM & MAG HYDROXIDE-SIMETH 200-200-20 MG/5ML PO SUSP: 15.0000 mL | ORAL | Status: DC | PRN

## 2018-07-23 MED ORDER — LEVOTHYROXINE SODIUM 75 MCG PO TABS: 37.5000 ug | ORAL_TABLET | ORAL | Status: DC

## 2018-07-23 MED ORDER — FENTANYL CITRATE (PF) 100 MCG/2ML IJ SOLN
INTRAMUSCULAR | Status: AC
  Filled 2018-07-23: qty 2

## 2018-07-23 MED ORDER — PANTOPRAZOLE SODIUM 40 MG PO TBEC
40.0000 mg | DELAYED_RELEASE_TABLET | Freq: Every day | ORAL | Status: DC
  Administered 2018-07-24: 40 mg via ORAL
  Filled 2018-07-23: qty 1

## 2018-07-23 MED ORDER — HYDROMORPHONE HCL 1 MG/ML IJ SOLN
0.2500 mg | INTRAMUSCULAR | Status: DC | PRN
  Administered 2018-07-23 (×2): 0.25 mg via INTRAVENOUS

## 2018-07-23 MED ORDER — POTASSIUM CHLORIDE CRYS ER 20 MEQ PO TBCR: 20.0000 meq | EXTENDED_RELEASE_TABLET | Freq: Every day | ORAL | Status: DC | PRN

## 2018-07-23 MED ORDER — SODIUM CHLORIDE 0.9 % IV SOLN
INTRAVENOUS | Status: DC | PRN
  Administered 2018-07-23: 09:00:00

## 2018-07-23 MED ORDER — LABETALOL HCL 5 MG/ML IV SOLN
INTRAVENOUS | Status: AC
  Filled 2018-07-23: qty 4

## 2018-07-23 MED ORDER — LEVOTHYROXINE SODIUM 75 MCG PO TABS
75.0000 ug | ORAL_TABLET | ORAL | Status: DC
  Administered 2018-07-24: 75 ug via ORAL
  Filled 2018-07-23: qty 1

## 2018-07-23 MED ORDER — LABETALOL HCL 5 MG/ML IV SOLN
10.0000 mg | INTRAVENOUS | Status: DC | PRN
  Administered 2018-07-23: 10 mg via INTRAVENOUS

## 2018-07-23 MED ORDER — VANCOMYCIN HCL IN DEXTROSE 1-5 GM/200ML-% IV SOLN
1000.0000 mg | Freq: Two times a day (BID) | INTRAVENOUS | Status: AC
  Administered 2018-07-23 – 2018-07-24 (×2): 1000 mg via INTRAVENOUS
  Filled 2018-07-23 (×2): qty 200

## 2018-07-23 MED ORDER — PROPOFOL 500 MG/50ML IV EMUL
INTRAVENOUS | Status: DC | PRN
  Administered 2018-07-23: 09:00:00 via INTRAVENOUS
  Administered 2018-07-23: 100 ug/kg/min via INTRAVENOUS

## 2018-07-23 MED ORDER — BISACODYL 10 MG RE SUPP: 10.0000 mg | Freq: Every day | RECTAL | Status: DC | PRN

## 2018-07-23 MED ORDER — ROSUVASTATIN CALCIUM 10 MG PO TABS
10.0000 mg | ORAL_TABLET | ORAL | Status: DC
  Administered 2018-07-24: 10 mg via ORAL
  Filled 2018-07-23: qty 1

## 2018-07-23 MED ORDER — 0.9 % SODIUM CHLORIDE (POUR BTL) OPTIME
TOPICAL | Status: DC | PRN
  Administered 2018-07-23: 1000 mL

## 2018-07-23 MED ORDER — ONDANSETRON HCL 4 MG/2ML IJ SOLN
4.0000 mg | Freq: Four times a day (QID) | INTRAMUSCULAR | Status: DC | PRN
  Administered 2018-07-24: 4 mg via INTRAVENOUS
  Filled 2018-07-23: qty 2

## 2018-07-23 MED ORDER — SODIUM CHLORIDE 0.9 % IV SOLN: 500.0000 mL | Freq: Once | INTRAVENOUS | Status: DC | PRN

## 2018-07-23 MED ORDER — SODIUM CHLORIDE 0.9 % IV SOLN: INTRAVENOUS | Status: DC

## 2018-07-23 MED ORDER — INSULIN PUMP
Freq: Three times a day (TID) | SUBCUTANEOUS | Status: DC
  Administered 2018-07-23: 9.5 via SUBCUTANEOUS
  Administered 2018-07-23 – 2018-07-24 (×3): via SUBCUTANEOUS
  Filled 2018-07-23: qty 1

## 2018-07-23 MED ORDER — GUAIFENESIN-DM 100-10 MG/5ML PO SYRP: 15.0000 mL | ORAL_SOLUTION | ORAL | Status: DC | PRN

## 2018-07-23 MED ORDER — SUCRALFATE 1 G PO TABS: 1.0000 g | ORAL_TABLET | Freq: Three times a day (TID) | ORAL | Status: DC | PRN

## 2018-07-23 MED ORDER — ESMOLOL HCL 100 MG/10ML IV SOLN
INTRAVENOUS | Status: DC | PRN
  Administered 2018-07-23: 50 ug via INTRAVENOUS
  Administered 2018-07-23: 20 ug via INTRAVENOUS
  Administered 2018-07-23: 30 ug via INTRAVENOUS

## 2018-07-23 MED ORDER — HYDRALAZINE HCL 20 MG/ML IJ SOLN
INTRAMUSCULAR | Status: AC
  Filled 2018-07-23: qty 1

## 2018-07-23 MED ORDER — IODIXANOL 320 MG/ML IV SOLN
INTRAVENOUS | Status: DC | PRN
  Administered 2018-07-23: 100 mL via INTRAVENOUS

## 2018-07-23 MED ORDER — SUGAMMADEX SODIUM 200 MG/2ML IV SOLN
INTRAVENOUS | Status: DC | PRN
  Administered 2018-07-23: 276 mg via INTRAVENOUS

## 2018-07-23 MED ORDER — FENTANYL CITRATE (PF) 250 MCG/5ML IJ SOLN
INTRAMUSCULAR | Status: DC | PRN
  Administered 2018-07-23 (×2): 50 ug via INTRAVENOUS

## 2018-07-23 MED ORDER — DEXAMETHASONE SODIUM PHOSPHATE 10 MG/ML IJ SOLN
INTRAMUSCULAR | Status: DC | PRN
  Administered 2018-07-23: 6 mg via INTRAVENOUS
  Administered 2018-07-23: 4 mg via INTRAVENOUS

## 2018-07-23 MED ORDER — ASPIRIN EC 81 MG PO TBEC
81.0000 mg | DELAYED_RELEASE_TABLET | Freq: Every day | ORAL | Status: DC
  Administered 2018-07-24: 81 mg via ORAL
  Filled 2018-07-23: qty 1

## 2018-07-23 MED ORDER — PROPOFOL 10 MG/ML IV BOLUS
INTRAVENOUS | Status: DC | PRN
  Administered 2018-07-23 (×2): 100 mg via INTRAVENOUS
  Administered 2018-07-23: 15 mg via INTRAVENOUS

## 2018-07-23 MED ORDER — REMIFENTANIL HCL 2 MG IV SOLR
INTRAVENOUS | Status: DC | PRN
  Administered 2018-07-23: .5 ug/kg/min via INTRAVENOUS

## 2018-07-23 MED ORDER — SODIUM CHLORIDE 0.9 % IV SOLN
INTRAVENOUS | Status: DC | PRN
  Administered 2018-07-23: 15 ug/min via INTRAVENOUS

## 2018-07-23 MED ORDER — HEPARIN SODIUM (PORCINE) 1000 UNIT/ML IJ SOLN
INTRAMUSCULAR | Status: DC | PRN
  Administered 2018-07-23: 7000 [IU] via INTRAVENOUS
  Administered 2018-07-23: 3000 [IU] via INTRAVENOUS

## 2018-07-23 MED ORDER — LIDOCAINE HCL (PF) 1 % IJ SOLN
INTRAMUSCULAR | Status: AC
  Filled 2018-07-23: qty 30

## 2018-07-23 MED ORDER — DIPHENHYDRAMINE HCL 50 MG/ML IJ SOLN
INTRAMUSCULAR | Status: DC | PRN
  Administered 2018-07-23: 12.5 mg via INTRAVENOUS

## 2018-07-23 MED ORDER — LACTATED RINGERS IV SOLN
INTRAVENOUS | Status: DC | PRN
  Administered 2018-07-23 (×3): via INTRAVENOUS

## 2018-07-23 MED ORDER — GLYCOPYRROLATE 0.2 MG/ML IJ SOLN
INTRAMUSCULAR | Status: DC | PRN
  Administered 2018-07-23 (×2): 0.1 mg via INTRAVENOUS

## 2018-07-23 MED ORDER — POLYETHYLENE GLYCOL 3350 17 G PO PACK: 17.0000 g | PACK | Freq: Every day | ORAL | Status: DC | PRN

## 2018-07-23 MED ORDER — HYDRALAZINE HCL 20 MG/ML IJ SOLN
5.0000 mg | INTRAMUSCULAR | Status: DC | PRN
  Administered 2018-07-23: 5 mg via INTRAVENOUS
  Filled 2018-07-23 (×2): qty 0.25

## 2018-07-23 MED ORDER — BENZOCAINE 10 % MT GEL
Freq: Four times a day (QID) | OROMUCOSAL | Status: DC | PRN
  Filled 2018-07-23: qty 9

## 2018-07-23 MED ORDER — CLOPIDOGREL BISULFATE 75 MG PO TABS
75.0000 mg | ORAL_TABLET | Freq: Every day | ORAL | Status: DC
  Administered 2018-07-24: 75 mg via ORAL
  Filled 2018-07-23: qty 1

## 2018-07-23 MED ORDER — LACTATED RINGERS IV SOLN
INTRAVENOUS | Status: DC | PRN
  Administered 2018-07-23: 08:00:00 via INTRAVENOUS

## 2018-07-23 MED ORDER — ROCURONIUM BROMIDE 10 MG/ML (PF) SYRINGE
PREFILLED_SYRINGE | INTRAVENOUS | Status: DC | PRN
  Administered 2018-07-23: 10 mg via INTRAVENOUS
  Administered 2018-07-23: 50 mg via INTRAVENOUS
  Administered 2018-07-23: 10 mg via INTRAVENOUS

## 2018-07-23 MED ORDER — METOPROLOL TARTRATE 5 MG/5ML IV SOLN: 2.0000 mg | INTRAVENOUS | Status: DC | PRN

## 2018-07-23 MED ORDER — HYDROMORPHONE HCL 1 MG/ML IJ SOLN
INTRAMUSCULAR | Status: AC
  Filled 2018-07-23: qty 1

## 2018-07-23 MED ORDER — NITROGLYCERIN 0.4 MG SL SUBL: 0.4000 mg | SUBLINGUAL_TABLET | SUBLINGUAL | Status: DC | PRN

## 2018-07-23 MED ORDER — MAGNESIUM SULFATE 2 GM/50ML IV SOLN: 2.0000 g | Freq: Every day | INTRAVENOUS | Status: DC | PRN

## 2018-07-23 MED ORDER — HYDROCODONE-ACETAMINOPHEN 5-325 MG PO TABS
1.0000 | ORAL_TABLET | ORAL | Status: DC | PRN
  Administered 2018-07-23: 1 via ORAL
  Filled 2018-07-23: qty 1

## 2018-07-23 MED ORDER — LIDOCAINE VISCOUS HCL 2 % MT SOLN
15.0000 mL | OROMUCOSAL | Status: DC | PRN
  Filled 2018-07-23: qty 15

## 2018-07-23 MED ORDER — FENTANYL CITRATE (PF) 100 MCG/2ML IJ SOLN
25.0000 ug | INTRAMUSCULAR | Status: DC | PRN
  Administered 2018-07-23 (×2): 25 ug via INTRAVENOUS
  Administered 2018-07-23: 50 ug via INTRAVENOUS

## 2018-07-23 MED ORDER — ACETAMINOPHEN 325 MG PO TABS: 325.0000 mg | ORAL_TABLET | ORAL | Status: DC | PRN

## 2018-07-23 MED ORDER — CHLORHEXIDINE GLUCONATE CLOTH 2 % EX PADS: 6.0000 | MEDICATED_PAD | Freq: Once | CUTANEOUS | Status: DC

## 2018-07-23 MED ORDER — ACETAMINOPHEN 325 MG RE SUPP: 325.0000 mg | RECTAL | Status: DC | PRN

## 2018-07-23 MED ORDER — ONDANSETRON HCL 4 MG/2ML IJ SOLN
INTRAMUSCULAR | Status: DC | PRN
  Administered 2018-07-23: 4 mg via INTRAVENOUS

## 2018-07-23 MED ORDER — VANCOMYCIN HCL IN DEXTROSE 1-5 GM/200ML-% IV SOLN
1000.0000 mg | INTRAVENOUS | Status: AC
  Administered 2018-07-23: 1000 mg via INTRAVENOUS
  Filled 2018-07-23: qty 200

## 2018-07-23 MED ORDER — SODIUM CHLORIDE 0.9 % IV SOLN
INTRAVENOUS | Status: AC
  Filled 2018-07-23: qty 1.2

## 2018-07-23 MED ORDER — PROTAMINE SULFATE 10 MG/ML IV SOLN
INTRAVENOUS | Status: DC | PRN
  Administered 2018-07-23: 40 mg via INTRAVENOUS

## 2018-07-23 MED ORDER — SODIUM CHLORIDE 0.9 % IV SOLN
INTRAVENOUS | Status: DC | PRN
  Administered 2018-07-23: 3 [IU]/h via INTRAVENOUS

## 2018-07-23 MED ORDER — NEOMYCIN-POLYMYXIN-DEXAMETH 0.1 % OP SUSP
1.0000 [drp] | Freq: Every day | OPHTHALMIC | Status: DC
  Administered 2018-07-24: 1 [drp] via OPHTHALMIC
  Filled 2018-07-23 (×2): qty 5

## 2018-07-23 MED ORDER — TRAMADOL HCL 50 MG PO TABS: 25.0000 mg | ORAL_TABLET | Freq: Four times a day (QID) | ORAL | Status: DC

## 2018-07-23 MED ORDER — LIDOCAINE 2% (20 MG/ML) 5 ML SYRINGE
INTRAMUSCULAR | Status: DC | PRN
  Administered 2018-07-23: 40 mg via INTRAVENOUS

## 2018-07-23 MED ORDER — SODIUM CHLORIDE 0.9 % IV SOLN
0.0125 ug/kg/min | INTRAVENOUS | Status: DC
  Filled 2018-07-23: qty 2000

## 2018-07-23 MED ORDER — PHENOL 1.4 % MT LIQD
1.0000 | OROMUCOSAL | Status: DC | PRN
  Filled 2018-07-23: qty 177

## 2018-07-23 MED ORDER — LABETALOL HCL 5 MG/ML IV SOLN
INTRAVENOUS | Status: DC | PRN
  Administered 2018-07-23 (×2): 5 mg via INTRAVENOUS

## 2018-07-23 SURGICAL SUPPLY — 75 items
BAG BANDED W/RUBBER/TAPE 36X54 (MISCELLANEOUS) ×6 IMPLANT
BALLN STERLING RX 5X30X80 (BALLOONS) ×3
BALLOON STERLING RX 5X30X80 (BALLOONS) ×1 IMPLANT
CANISTER SUCT 3000ML PPV (MISCELLANEOUS) ×3 IMPLANT
CATH ROBINSON RED A/P 18FR (CATHETERS) ×3 IMPLANT
CLIP VESOCCLUDE MED 6/CT (CLIP) ×3 IMPLANT
CLIP VESOCCLUDE SM WIDE 6/CT (CLIP) ×3 IMPLANT
COVER DOME SNAP 22 D (MISCELLANEOUS) ×6 IMPLANT
COVER PROBE W GEL 5X96 (DRAPES) ×3 IMPLANT
CRADLE DONUT ADULT HEAD (MISCELLANEOUS) ×3 IMPLANT
DERMABOND ADVANCED (GAUZE/BANDAGES/DRESSINGS) ×4
DERMABOND ADVANCED .7 DNX12 (GAUZE/BANDAGES/DRESSINGS) ×2 IMPLANT
DEVICE INFLATION ENCORE 26 (MISCELLANEOUS) ×3 IMPLANT
DRAIN CHANNEL 15F RND FF W/TCR (WOUND CARE) IMPLANT
DRAPE INCISE IOBAN 85X60 (DRAPES) ×6 IMPLANT
DRAPE UNIVERSAL PACK (DRAPES) ×3 IMPLANT
ELECT REM PT RETURN 9FT ADLT (ELECTROSURGICAL) ×3
ELECTRODE REM PT RTRN 9FT ADLT (ELECTROSURGICAL) ×1 IMPLANT
EVACUATOR SILICONE 100CC (DRAIN) IMPLANT
GLOVE BIO SURGEON STRL SZ 6.5 (GLOVE) ×4 IMPLANT
GLOVE BIO SURGEON STRL SZ7.5 (GLOVE) ×9 IMPLANT
GLOVE BIO SURGEONS STRL SZ 6.5 (GLOVE) ×2
GLOVE BIOGEL PI IND STRL 6.5 (GLOVE) ×3 IMPLANT
GLOVE BIOGEL PI IND STRL 8 (GLOVE) ×2 IMPLANT
GLOVE BIOGEL PI INDICATOR 6.5 (GLOVE) ×6
GLOVE BIOGEL PI INDICATOR 8 (GLOVE) ×4
GOWN STRL REUS W/ TWL LRG LVL3 (GOWN DISPOSABLE) ×2 IMPLANT
GOWN STRL REUS W/ TWL XL LVL3 (GOWN DISPOSABLE) ×1 IMPLANT
GOWN STRL REUS W/TWL LRG LVL3 (GOWN DISPOSABLE) ×4
GOWN STRL REUS W/TWL XL LVL3 (GOWN DISPOSABLE) ×2
GUIDEWIRE ENROUTE 0.014 (WIRE) ×3 IMPLANT
HEMOSTAT SNOW SURGICEL 2X4 (HEMOSTASIS) IMPLANT
INSERT FOGARTY SM (MISCELLANEOUS) IMPLANT
INTRODUCER KIT GALT 7CM (INTRODUCER) ×4
IV ADAPTER SYR DOUBLE MALE LL (MISCELLANEOUS) IMPLANT
KIT BASIN OR (CUSTOM PROCEDURE TRAY) ×3 IMPLANT
KIT ENCORE 26 ADVANTAGE (KITS) ×3 IMPLANT
KIT INTRODUCER GALT 7 (INTRODUCER) ×2 IMPLANT
KIT TURNOVER KIT B (KITS) ×3 IMPLANT
NEEDLE HYPO 25GX1X1/2 BEV (NEEDLE) IMPLANT
NEEDLE PERC 18GX7CM (NEEDLE) ×3 IMPLANT
NEEDLE SPNL 20GX3.5 QUINCKE YW (NEEDLE) IMPLANT
PACK CAROTID (CUSTOM PROCEDURE TRAY) ×3 IMPLANT
PAD ARMBOARD 7.5X6 YLW CONV (MISCELLANEOUS) ×6 IMPLANT
PROTECTION STATION PRESSURIZED (MISCELLANEOUS) ×3
SET MICROPUNCTURE 5F STIFF (MISCELLANEOUS) ×6 IMPLANT
SHEATH AVANTI 11CM 5FR (SHEATH) IMPLANT
SHUNT CAROTID BYPASS 10 (VASCULAR PRODUCTS) IMPLANT
SHUNT CAROTID BYPASS 12FRX15.5 (VASCULAR PRODUCTS) IMPLANT
STATION PROTECTION PRESSURIZED (MISCELLANEOUS) ×1 IMPLANT
STENT TRANSCAROTID SYSTEM 8X40 (Permanent Stent) ×3 IMPLANT
STOPCOCK 4 WAY LG BORE MALE ST (IV SETS) ×3 IMPLANT
SUT ETHILON 3 0 PS 1 (SUTURE) IMPLANT
SUT MNCRL AB 4-0 PS2 18 (SUTURE) ×3 IMPLANT
SUT PROLENE 5 0 C 1 24 (SUTURE) ×3 IMPLANT
SUT PROLENE 6 0 BV (SUTURE) IMPLANT
SUT PROLENE 7 0 BV 1 (SUTURE) IMPLANT
SUT SILK 2 0 SH CR/8 (SUTURE) ×3 IMPLANT
SUT SILK 3 0 (SUTURE)
SUT SILK 3-0 18XBRD TIE 12 (SUTURE) IMPLANT
SUT VIC AB 3-0 SH 27 (SUTURE) ×2
SUT VIC AB 3-0 SH 27X BRD (SUTURE) ×1 IMPLANT
SUT VICRYL 4-0 PS2 18IN ABS (SUTURE) ×3 IMPLANT
SYR 10ML LL (SYRINGE) ×9 IMPLANT
SYR 20CC LL (SYRINGE) ×3 IMPLANT
SYR 5ML LL (SYRINGE) ×3 IMPLANT
SYR CONTROL 10ML LL (SYRINGE) IMPLANT
SYSTEM TRANSCAROTID NEUROPRTCT (MISCELLANEOUS) ×1 IMPLANT
TOWEL GREEN STERILE (TOWEL DISPOSABLE) ×3 IMPLANT
TRANSCAROTID NEUROPROTECT SYS (MISCELLANEOUS) ×3
TUBING ART PRESS 48 MALE/FEM (TUBING) IMPLANT
TUBING EXTENTION W/L.L. (IV SETS) IMPLANT
WATER STERILE IRR 1000ML POUR (IV SOLUTION) ×3 IMPLANT
WIRE AMPLATZ SS-J .035X180CM (WIRE) IMPLANT
WIRE BENTSON .035X145CM (WIRE) ×6 IMPLANT

## 2018-07-23 NOTE — Progress Notes (Signed)
  Day of Surgery Note    Subjective:  Pt seen in recovery. Feels better than earlier.  Very happy & impressed with Dr. Carlis Abbott.   Vitals:   07/23/18 1125 07/23/18 1140  BP: 131/64 (!) 142/59  Pulse: 88 80  Resp: 19 14  Temp:    SpO2: 97% 96%    Incisions:   Left neck is clean and dry; right groin is clean without hematoma Extremities:  Moving all extremities equally; palpable DP pulses bilaterally Cardiac:  regular Lungs:  Non labored Neuro:  In tact   Assessment/Plan:  This is a 77 y.o. female who is s/p  Left TCAR  -pt doing well and neuro intact in recovery -orders for insulin pump to be placed -after an hour, pt may get out of bed to use restroom -to 4 east when bed available -anticipate discharge home tomorrow.   Cassandra Locket, PA-C 07/23/2018 12:38 PM [phone]

## 2018-07-23 NOTE — Anesthesia Postprocedure Evaluation (Signed)
Anesthesia Post Note  Patient: Cassandra Benson  Procedure(s) Performed: Dorthula Perfect ARTERY REVASCULARIZATION (Left )     Patient location during evaluation: PACU Anesthesia Type: General Level of consciousness: awake and alert, oriented and patient cooperative Pain management: pain level controlled Vital Signs Assessment: post-procedure vital signs reviewed and stable Respiratory status: spontaneous breathing, nonlabored ventilation, respiratory function stable and patient connected to nasal cannula oxygen Cardiovascular status: blood pressure returned to baseline and stable Postop Assessment: no apparent nausea or vomiting Anesthetic complications: no    Last Vitals:  Vitals:   07/23/18 1240 07/23/18 1310  BP: (!) 123/57 132/62  Pulse: 89 90  Resp: 12 15  Temp:    SpO2: 93% 95%    Last Pain:  Vitals:   07/23/18 1327  TempSrc:   PainSc: Asleep                 Jaycion Treml,E. Deaisha Welborn

## 2018-07-23 NOTE — Progress Notes (Signed)
Patient called me to the room because she felt something was wrong with her blood sugar. She c/o blurry vision. CBG=280 mg/dl. Patient calculated 45g CHO on insulin pump and pump bolused 5.6 of insulin. Will recheck blood sugar in 30 minutes. Will continue to monitor. Lajoyce Corners, RN

## 2018-07-23 NOTE — Telephone Encounter (Signed)
-----   Message from Gabriel Earing, [place] sent at 07/23/2018 10:13 AM EDT ----- S/p left TCAR 10.28.19.  F/u with Dr. Carlis Abbott in 4 weeks with carotid duplex.  Thanks

## 2018-07-23 NOTE — Op Note (Addendum)
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  Ultrasound-guided access of the right common femoral vein using ultrasound guidance 2.  Transcatheter placement of intravascular stent of left cervical carotid artery including angioplasty and radiologic supervision (transcarotid artery revascularization, TCAR)  Surgeon: Dr. Marty Heck, MD  Assistant: Dr. Deitra Mayo, MD and Leontine Locket, PA  Occasions: Patient is a 77 year old female who was recently seen in clinic for a second opinion regarding a high-grade stenosis of her left internal carotid artery.  The lesion was distal to the carotid bifurcation in her proximal to mid ICA and she had been having intermittent symptoms in her right hand that were questionable for a history of TIAs.  After evaluation of her imaging, I recommended a trans-carotid artery revascularization based on her age as well as high cervical lesion.  She presents today after risks and benefits were discussed.  Findings: High-grade greater than 80% stenosis of the left internal carotid artery with disease extending down to the carotid bifurcation.  After predilation with a 5 mm balloon in the internal carotid artery and then stent placement with a 8 mm x 40 mm trans-carotid stent that was extended from ICA lesion into common carotid artery past bifurcation there was less than 30% residual stenosis.  Complications: None  Details: The patient was taken to the operating room after informed consent was obtained.  She was placed on operative table in supine position.  Her bilateral groins as well as her left neck were prepped and draped in the standard sterile fashion.  Prior to preparation we did place a bump under her shoulder and turned her head to the right.  Once a preop timeout was performed to identify patient, procedure and site.  We initially  used ultrasound guidance to identify her right common femoral vein and using a percutaneous needle to access her right common femoral vein during which an image was saved and we placed a Bentson wire into her femoral vein and over this placed the femoral sheath for the TCAR device. We then turned our attention to her left neck where we had used ultrasound to identify the sternocleidomastoid between the sternal and clavicular head of the muscle and her subsequent underlying common carotid artery.  Performed a longitudinal incision over this that was extended down to her clavicle and using Bovie cautery and blunt dissection we dissected through the platysma and then dissected the head of the sternocleidal mastoid muscle and then isolated the common carotid artery that was controlled proximally with a Rummel tourniquet.  I then used a 5-0 Prolene and placed a pursestring suture in the common carotid artery just above the clavicle where we had exposure.  The patient had been given 7000 units of IV heparin and we checked an ACT which was initially subtherapeutic and then gave another 3000 heparin we had an ACT greater than 250.  Then used percutaenous access needle and accessed the common carotid artery in the middle of our pursestring and placed a micro-sheath and then shot a cerebral arteriogram that showed a patent common carotid as well as a patent bifurcation but a high-grade stenosis of the proximal to mid left internal carotid artery.  We then used a Amplatz wire and exchanged for a stiff sheath that was placed in the base of the left common carotid artery and did obtain several more additional views to ensure that we are in the true lumen of  the common carotid.  The TCAR flow reversal system was then connected and we ensured that we had appropriate passive flow reversal.  A TCAR timeout was performed.  We then clamped the common carotid artery and went on active flow reversal.  We then were able to get a wire up  across the internal carotid lesion and this was predilated with a 5 mm x 30 mm Sterling angioplasty balloon.  We then deployed an 8 mm x 40 mm Enroute trans-carotid stent.  Final completion carotid arteriogram showed less than 30% residual stenosis with adequate filling of the distal ICA.  At this point in time our wire was removed.  We subsequently removed the sheath and tied down the pursestring on the common carotid artery.  Protamine was given for reversal.  The sheath was removed from the right common femoral vein and pressure was held.  We washed out the surgical wound in her neck and closed with a 3-0 Vicryl and 4-0 Monocryl.  Dermabond was applied.  Pressure was held and she was awakened with no deficits.  Condition: Stable  Anesthesia: General  Marty Heck, MD [facility] Office: [phone] Pager: [place]

## 2018-07-23 NOTE — Anesthesia Procedure Notes (Signed)
Arterial Line Insertion Start/End10/28/2019 7:00 AM, 07/23/2018 7:09 AM Performed by: Jearld Pies, CRNA, CRNA  Patient location: Pre-op. Preanesthetic checklist: patient identified, IV checked, site marked, risks and benefits discussed, surgical consent, monitors and equipment checked, pre-op evaluation, timeout performed and anesthesia consent Lidocaine 1% used for infiltration Right, radial was placed Catheter size: 20 G Hand hygiene performed , maximum sterile barriers used  and Seldinger technique used Allen's test indicative of satisfactory collateral circulation Attempts: 1 Procedure performed without using ultrasound guided technique. Following insertion, dressing applied and Biopatch. Post procedure assessment: normal  Patient tolerated the procedure well with no immediate complications.

## 2018-07-23 NOTE — Transfer of Care (Signed)
Immediate Anesthesia Transfer of Care Note  Patient: Cassandra Benson  Procedure(s) Performed: Dorthula Perfect ARTERY REVASCULARIZATION (Left )  Patient Location: PACU  Anesthesia Type:General  Level of Consciousness: drowsy, patient cooperative and responds to stimulation  Airway & Oxygen Therapy: Patient Spontanous Breathing and Patient connected to nasal cannula oxygen  Post-op Assessment: Report given to RN, Post -op Vital signs reviewed and stable, Patient moving all extremities, Patient moving all extremities X 4 and Patient able to stick tongue midline  Post vital signs: Reviewed and stable  Last Vitals:  Vitals Value Taken Time  BP 149/55   Temp    Pulse 82 07/23/2018 10:06 AM  Resp 18 07/23/2018 10:06 AM  SpO2 94 % 07/23/2018 10:06 AM  Vitals shown include unvalidated device data.  Last Pain:  Vitals:   07/23/18 0613  TempSrc:   PainSc: 7          Complications: No apparent anesthesia complications

## 2018-07-23 NOTE — Progress Notes (Signed)
Inpatient Diabetes Program Recommendations  AACE/ADA: New Consensus Statement on Inpatient Glycemic Control (2015)  Target Ranges:  Prepandial:   less than 140 mg/dL      Peak postprandial:   less than 180 mg/dL (1-2 hours)      Critically ill patients:  140 - 180 mg/dL   Lab Results  Component Value Date   GLUCAP 155 (H) 07/23/2018   HGBA1C 8.1 (H) 06/11/2018    Review of Glycemic Control Results for Cassandra Benson, Cassandra Benson (MRN [id number]) as of 07/23/2018 12:07  Ref. Range 07/23/2018 05:45 07/23/2018 10:05 07/23/2018 11:35  Glucose-Capillary Latest Ref Range: 70 - 99 mg/dL 157 (H) 175 (H) 155 (H)   Diabetes history: LADA Outpatient Diabetes medications: Insulin pump Pump settings: - basal rates: 12 am: 0.800 units/h 3 am: 0.875 9:30 am: 0.100 5 pm: 1.500 6:30 pm: 1.300 11:30 pm: 1.800  - ICR:              12 am: 10 () 6 pm: 8 () - target: 130-130 except 6-8 am and 6 pm-12 am: 100-100 - ISF: 35 - Insulin on Board: 4h TDD from basal insulin: 82% >> 69% >> 65% >> 69% TDD from bolus insulin: 18% >> 31% >> 35% >> 31% Total daily dose: 30 units per day (up to 40 units for prescriptions) Current orders for Inpatient glycemic control: IV insulin transitioned to insulin pump in PACU Inpatient Diabetes Program Recommendations:   Spoke to RN, [name].  She states that patient has transitioned off insulin drip to insulin pump in the PACU.  Sent secure message requesting orders for insulin pump.  Will follow while in the hospital.   Thanks,  Adah Perl, RN, BC-ADM Inpatient Diabetes Coordinator Pager [phone] (8a-5p)

## 2018-07-23 NOTE — Anesthesia Procedure Notes (Signed)
Procedure Name: Intubation Date/Time: 07/23/2018 7:58 AM Performed by: Jearld Pies, CRNA Pre-anesthesia Checklist: Patient identified, Emergency Drugs available, Suction available and Patient being monitored Patient Re-evaluated:Patient Re-evaluated prior to induction Oxygen Delivery Method: Circle System Utilized Preoxygenation: Pre-oxygenation with 100% oxygen Induction Type: IV induction Ventilation: Mask ventilation without difficulty Laryngoscope Size: Mac and 3 Grade View: Grade III Tube type: Oral Tube size: 6.5 mm Number of attempts: 1 Airway Equipment and Method: Stylet,  Oral airway and Bougie stylet Placement Confirmation: ETT inserted through vocal cords under direct vision,  positive ETCO2 and breath sounds checked- equal and bilateral Secured at: 22 cm Tube secured with: Tape Dental Injury: Teeth and Oropharynx as per pre-operative assessment  Comments: DL x 2 via CRNA, Grade III view, Jackson MD DL x 1, Grade III view, bougie utilized, + ETCO2, successful intubation

## 2018-07-23 NOTE — Discharge Instructions (Signed)
[facility]  Discharge Instructions   Carotid Surgery  Please refer to the following instructions for your post-procedure care. Your surgeon or physician assistant will discuss any changes with you.  Activity  You are encouraged to walk as much as you can. You can slowly return to normal activities but must avoid strenuous activity and heavy lifting until your doctor tell you it's okay. Avoid activities such as vacuuming or swinging a golf club. You can drive after one week if you are comfortable and you are no longer taking prescription pain medications. It is normal to feel tired for serval weeks after your surgery. It is also normal to have difficulty with sleep habits, eating, and bowel movements after surgery. These will go away with time.  Bathing/Showering  Shower daily after you go home. Do not soak in a bathtub, hot tub, or swim until the incision heals completely.  Incision Care  Shower every day. Clean your incision with mild soap and water. Pat the area dry with a clean towel. You do not need a bandage unless otherwise instructed. Do not apply any ointments or creams to your incision. You may have skin glue on your incision. Do not peel it off. It will come off on its own in about one week. Your incision may feel thickened and raised for several weeks after your surgery. This is normal and the skin will soften over time.   For Men Only: It's okay to shave around the incision but do not shave the incision itself for 2 weeks. It is common to have numbness under your chin that could last for several months.  Wash the groin wound with soap and water daily and pat dry. (No tub bath-only shower)  Then put a dry gauze or washcloth there to keep this area dry daily and as needed.  Do not use Vaseline or neosporin on your incisions.  Only use soap and water on your incisions and then protect and keep dry.   Diet  Resume your normal diet. There are no  special food restrictions following this procedure. A low fat/low cholesterol diet is recommended for all patients with vascular disease. In order to heal from your surgery, it is CRITICAL to get adequate nutrition. Your body requires vitamins, minerals, and protein. Vegetables are the best source of vitamins and minerals. Vegetables also provide the perfect balance of protein. Processed food has little nutritional value, so try to avoid this.  Medications  Resume taking all of your medications unless your doctor or physician assistant tells you not to. If your incision is causing pain, you may take over-the- counter pain relievers such as acetaminophen (Tylenol). If you were prescribed a stronger pain medication, please be aware these medications can cause nausea and constipation. Prevent nausea by taking the medication with a snack or meal. Avoid constipation by drinking plenty of fluids and eating foods with a high amount of fiber, such as fruits, vegetables, and grains.  Do not take Tylenol if you are taking prescription pain medications.  Follow Up  Our office will schedule a follow up appointment 2-3 weeks following discharge.  Please call us immediately for any of the following conditions   Increased pain, redness, drainage (pus) from your incision site.  Fever of 101 degrees or higher.  If you should develop stroke (slurred speech, difficulty swallowing, weakness on one side of your body, loss of vision) you should call 911 and go to the nearest emergency room.  Reduce your risk of vascular disease:   Stop smoking. If you would like help call [facility] at 1-800-QUIT-NOW 919-860-4417) or [place] at (681) [phone].  Manage your cholesterol  Maintain a desired weight  Control your diabetes  Keep your blood pressure down   If you have any questions, please call the office at [phone].

## 2018-07-23 NOTE — Telephone Encounter (Signed)
sch appt spk to pt  Daughter 08/28/18 9am p/o MD

## 2018-07-23 NOTE — H&P (Signed)
History and Physical Interval Note:  07/23/2018 7:30 AM  Cassandra Benson  has presented today for surgery, with the diagnosis of left carotid stenosis  The various methods of treatment have been discussed with the patient and family. After consideration of risks, benefits and other options for treatment, the patient has consented to  Procedure(s): TRANSCAROTID ARTERY REVASCULARIZATION (Left) as a surgical intervention .  The patient's history has been reviewed, patient examined, no change in status, stable for surgery.  I have reviewed the patient's chart and labs.  Questions were answered to the patient's satisfaction.   Left TCAR  Marty Heck  Patient name: Cassandra Benson MRN: [id number] DOB: 07-Jun-1941 Sex: female  REASON FOR CONSULT: Second opinion for high grade left carotid stenosis  HPI:  Cassandra Benson is a 77 y.o. female, with history of hypertension, hyperlipidemia, diabetes that presents for second opinion for left internal carotid artery stenosis. Patient states she has been under surveillance for the last 2-1/2 years after a bruit was initially identified in her left neck. Her PCP has been following this over time. Most recently she has had issues with right-sided headaches as well as a flareup of her lupus and underwent a right temporal artery biopsy that showed medial artery calcification. She had some right visual changes, but denies any left-sided visual changes. During this subsequent work-up she was identified to have progression of her left carotid stenosis per the patient. She has had some intermittent right-sided hand weakness over the last 4 months that she describes as intermittent and usually last for less than a minute. Sometimes only happens every couple of weeks. The symptoms are only in her right hand when she is lifting objects. She has no CT evidence of acute stroke and does not believe she had an MRI. She was seen by a vascular surgeon at [facility] and was  offered a transfemoral left carotid stent. No new symptoms since her evaluation at [name]. Denies head or neck radiation. Was given a script for Plavix and has not started that yet.      Past Medical History:  Diagnosis Date  . Allergy   . Arthritis   . Carotid stenosis, asymptomatic 06/19/2015   7-82% RICA 42-35% LICA rpt 1 yr (11/6142)   . Diabetes mellitus without complication ([place])   . Family history of adverse reaction to anesthesia    brothr went into cardiac arrest from anectine  . Fibromyalgia    prior PCP  . GERD (gastroesophageal reflux disease)    prior PCP  . Glaucoma    Narrow angle  . History of blood clots    DVT, in 20s, none since  . History of chicken pox   . History of diverticulitis   . History of pericarditis 1986   with hospitalization  . History of pneumonia 2014  . History of shingles   . History of UTI   . Hyperlipidemia   . Hypertension   . Hypothyroidism   . Lupus (systemic lupus erythematosus) ([place])   . Shoulder pain left   h/o RTC tendonitis and adhesive capsulitis  . Sleep apnea    prior PCP - no CPAP for about 10 yrs  . Vitamin D deficiency    prior PCP        Past Surgical History:  Procedure Laterality Date  . ABDOMINAL HYSTERECTOMY  1978   fibroids and menorrhagia, ovaries remain  . ARTERY BIOPSY Right 04/06/2018   Procedure: BIOPSY TEMPORAL ARTERY RIGHT; Surgeon: Beverly Gust,  MD; Location: [place]; Service: ENT; Laterality: Right; Diabetic - insulin pump  sleep apnea  . [facility] normal per patient  . COLONOSCOPY WITH ESOPHAGOGASTRODUODENOSCOPY (EGD)  03/2007   2 ulcers, benign polyp, rpt 5 yrs ([place])  . PARTIAL HIP ARTHROPLASTY  2013   Right hip replacement  . TONSILLECTOMY AND ADENOIDECTOMY    . VAGINAL DELIVERY     x2, no complications        Family History  Problem Relation Age of Onset  . CAD Mother 62   MI, aortic valve issues  . COPD Mother   .  Lupus Mother   . Graves' disease Mother   . Rheum arthritis Mother   . CAD Father 30   CABG x2, aortic valve replacement  . Stroke Sister   . Alcohol abuse Brother   . CAD Brother 82   MI  . Stroke Brother   . Seizures Son   . COPD Brother   . CAD Brother 58   stent  . Diabetes Brother   . Depression Grandchild   . Cancer Maternal Aunt    breast  . Breast cancer Maternal Aunt   . Diabetes Sister    deceased  . CAD Sister 22   stents  . Breast cancer Maternal Aunt    SOCIAL HISTORY:  Social History        Socioeconomic History  . Marital status: Widowed    Spouse name: Not on file  . Number of children: Not on file  . Years of education: Not on file  . Highest education level: Not on file  Occupational History  . Not on file  Social Needs  . Financial resource strain: Not on file  . Food insecurity:    Worry: Not on file    Inability: Not on file  . Transportation needs:    Medical: Not on file    Non-medical: Not on file  Tobacco Use  . Smoking status: Never Smoker  . Smokeless tobacco: Never Used  Substance and Sexual Activity  . Alcohol use: No  . Drug use: No  . Sexual activity: Not on file  Lifestyle  . Physical activity:    Days per week: Not on file    Minutes per session: Not on file  . Stress: Not on file  Relationships  . Social connections:    Talks on phone: Not on file    Gets together: Not on file    Attends religious service: Not on file    Active member of club or organization: Not on file    Attends meetings of clubs or organizations: Not on file    Relationship status: Not on file  . Intimate partner violence:    Fear of current or ex partner: Not on file    Emotionally abused: Not on file    Physically abused: Not on file    Forced sexual activity: Not on file  Other Topics Concern  . Not on file  Social History Narrative   Lives in [place] now. Recently moved from [place]. Widow - husband decreased 01/2016 of metastatic  colon CA   No pets.   Mother of Charrie Mcconnon.   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 Iodinated Diagnostic Agents Other (See Comments)    Itching (severe) and chest tightness  . Penicillins Anaphylaxis, Swelling and Rash    Has patient had a PCN reaction causing immediate rash, facial/tongue/throat swelling, SOB or lightheadedness with hypotension: Yes  Has patient had a PCN reaction causing severe rash involving mucus membranes or skin necrosis: Unknown  Has patient had a PCN reaction that required hospitalization: Unknown  Has patient had a PCN reaction occurring within the last 10 years: No  If all of the above answers are "NO", then may proceed with Cephalosporin use.   Marland Kitchen Anectine [Succinylcholine]     Brother went into cardiac arrest.  . Codeine Nausea Only  . Gabapentin     Gait abnormality  . Influenza Vaccines     Muscle weakness; unable to walk  . Nortriptyline     Eye swelling and mouth drawed up  . Pamelor [Nortriptyline Hcl]     Patient states caused her face to draw in together.  . Valsartan     Allergy to generic only  Other reaction(s): Cough  "Hacking" cough  . Erythromycin Rash and Swelling  . Sulfa Antibiotics Rash         Current Outpatient Medications  Medication Sig Dispense Refill  . aspirin EC 81 MG tablet Take 81 mg by mouth daily.    . BD ULTRA-FINE LANCETS lancets Use as instructed upto 6 times daily 600 each 2  . clopidogrel (PLAVIX) 75 MG tablet TK 1 T PO QD  3  . co-enzyme Q-10 30 MG capsule Take 30 mg by mouth daily.    . Cyanocobalamin (B-12 SL) Place 1 drop under the tongue daily.     . diclofenac sodium (VOLTAREN) 1 % GEL Apply 1 application topically 3 (three) times daily. 1 Tube 1  .  ergocalciferol (VITAMIN D2) 50000 units capsule Take 50,000 Units by mouth as directed. Every 14 to 30 days    . glucose blood (CONTOUR NEXT TEST) test strip Use as instructed to check 4 times daily 400 each 5  . insulin aspart (NOVOLOG) 100 UNIT/ML injection Use in an insulin pump upto 50 units per day 2 vial 6  . Krill Oil 500 MG CAPS Take 2 capsules (1,000 mg total) by mouth daily.    . Lecithin 400 MG CAPS Take 1 capsule by mouth 3 (three) times a week.     . levothyroxine (SYNTHROID, LEVOTHROID) 75 MCG tablet TAKE 1 TABLET BY MOUTH ONCE DAILY FOR 6 DAYS AND 1/2 TABLETS ON THE 7TH DAY  0  . lisinopril (PRINIVIL,ZESTRIL) 5 MG tablet Take 1 tablet (5 mg total) by mouth 2 (two) times daily. 180 tablet 1  . Multiple Vitamins-Minerals (PRESERVISION AREDS) TABS Take 2 tablets by mouth daily.    . naproxen sodium (ALEVE) 220 MG tablet Take 220 mg by mouth.    . nitroGLYCERIN (NITROSTAT) 0.4 MG SL tablet Place 1 tablet (0.4 mg total) under the tongue every 5 (five) minutes as needed for chest pain. 30 tablet 0  . pantoprazole (PROTONIX) 40 MG tablet Take 1 tablet (40 mg total) by mouth daily. 30 tablet 1  . rosuvastatin (CRESTOR) 10 MG tablet Take 1 tablet (10 mg total) by mouth every other day. 30 tablet 6  . SYNTHROID 75 MCG tablet TAKE 1 TABLET BY MOUTH ONCE DAILY FOR 6 DAYS AND 1 AND 1/2 TABLETS ON THE 7TH DAY 90 tablet 0  .  traMADol (ULTRAM) 50 MG tablet Take 0.5-1 tablets (25-50 mg total) by mouth 2 (two) times daily as needed for moderate pain. 30 tablet 0  . venlafaxine (EFFEXOR) 37.5 MG tablet Take 1 tablet (37.5 mg total) by mouth at bedtime.    . ciprofloxacin (CILOXAN) 0.3 % ophthalmic solution Place 1 drop into the left eye every 4 (four) hours while awake. (Patient not taking: Reported on 07/03/2018) 5 mL 0   No current facility-administered medications for this visit.    REVIEW OF SYSTEMS:  [X]  denotes positive finding, [ ]  denotes negative finding  Cardiac  Comments:  Chest pain or  chest pressure:    Shortness of breath upon exertion:    Short of breath when lying flat:    Irregular heart rhythm:        Vascular    Pain in calf, thigh, or hip brought on by ambulation:    Pain in feet at night that wakes you up from your sleep:     Blood clot in your veins:    Leg swelling:         Pulmonary    Oxygen at home:    Productive cough:     Wheezing:         Neurologic    Sudden weakness in arms or legs:  x Right hand intermittent weakness  Sudden numbness in arms or legs:     Sudden onset of difficulty speaking or slurred speech:    Temporary loss of vision in one eye:  x Blurry vision, right eye  Problems with dizziness:         Gastrointestinal    Blood in stool:     Vomited blood:         Genitourinary    Burning when urinating:     Blood in urine:        Psychiatric    Major depression:         Hematologic    Bleeding problems:    Problems with blood clotting too easily:        Skin    Rashes or ulcers:        Constitutional    Fever or chills:    PHYSICAL EXAM:       Vitals:   07/03/18 1015 07/03/18 1020 07/03/18 1022  BP: (!) 149/75 (!) 154/76 (!) 160/86  Pulse: 92 92 92  Resp: 16    Temp: (!) 97.4 F (36.3 C)    TempSrc: Oral    SpO2: 97%    Weight: 68.5 kg    Height: 5' 3.5" (1.613 m)     GENERAL: The patient is a well-nourished female, in no acute distress. The vital signs are documented above.  CARDIAC: There is a regular rate and rhythm.  VASCULAR:  2+ radial pulse palpable BUE  2+ femoral pulse palpable bilateral groins  2+ DP palpable BLE  PULMONARY: There is good air exchange bilaterally without wheezing or rales.  ABDOMEN: Soft and non-tender with normal pitched bowel sounds.  MUSCULOSKELETAL: There are no major deformities or cyanosis.  NEUROLOGIC: No focal weakness or paresthesias are detected.  SKIN: There are no ulcers or rashes noted.  PSYCHIATRIC: The patient has a normal affect.  DATA:  I independently  reviewed her CTA neck and this shows a high-grade >80% stenosis of the left ICA approximately 20 mm past the carotid bifurcation. The left common carotid artery is fairly disease free.  Assessment/Plan:  77 year old female that presents with high-grade  left internal carotid artery stenosis. This has been followed as an asymptomatic lesion over the last 2-1/2 years per the patient. Most recently over the last 3 to 4 months she has had some intermittent right hand weakness that suggest this may be a symptomatic lesion. After reviewing her anatomy, I think she is a reasonable TCAR candidate. I have recommended a left TCAR with trans-carotid artery revascularization including angioplasty/stent after review of her imaging. She qualifies based on age >14 and high lesion. I offered her surgery as soon as this Friday. Unfortunately the patient wants to wait till October 28 when her daughter is planning to come to town so she has assistance at home. She has a prescription for Plavix and I have instructed her to start Plavix immediately. She is already on an aspirin and a statin. We will arrange for left TCAR on 07/23/2018. I quoted her an approximate 1% stroke risk from the procedure after risks and benefits were discussed.  Marty Heck, MD  [facility] [place]  Office: [phone]  Pager: [place]

## 2018-07-24 ENCOUNTER — Other Ambulatory Visit: Payer: Self-pay

## 2018-07-24 ENCOUNTER — Encounter (HOSPITAL_COMMUNITY): Payer: Self-pay | Admitting: Vascular Surgery

## 2018-07-24 LAB — BASIC METABOLIC PANEL
Anion gap: 8 (ref 5–15)
BUN: 15 mg/dL (ref 8–23)
CHLORIDE: 110 mmol/L (ref 98–111)
CO2: 21 mmol/L — AB (ref 22–32)
CREATININE: 0.93 mg/dL (ref 0.44–1.00)
Calcium: 9.1 mg/dL (ref 8.9–10.3)
GFR calc Af Amer: >60 mL/min (ref >60)
GFR calc non Af Amer: 58 mL/min — ABNORMAL LOW (ref >60)
GLUCOSE: 221 mg/dL — AB (ref 70–99)
Potassium: 4 mmol/L (ref 3.5–5.1)
Sodium: 139 mmol/L (ref 135–145)

## 2018-07-24 LAB — CBC
HCT: 35.7 % — ABNORMAL LOW (ref 36.0–46.0)
Hemoglobin: 11.4 g/dL — ABNORMAL LOW (ref 12.0–15.0)
MCH: 26.3 pg (ref 26.0–34.0)
MCHC: 31.9 g/dL (ref 30.0–36.0)
MCV: 82.3 fL (ref 80.0–100.0)
NRBC: 0 % (ref 0.0–0.2)
Platelets: 213 10*3/uL (ref 150–400)
RBC: 4.34 MIL/uL (ref 3.87–5.11)
RDW: 14 % (ref 11.5–15.5)
WBC: 16.8 10*3/uL — AB (ref 4.0–10.5)

## 2018-07-24 LAB — GLUCOSE, CAPILLARY
Glucose-Capillary: 256 mg/dL — ABNORMAL HIGH (ref 70–99)
Glucose-Capillary: 205 mg/dL — ABNORMAL HIGH (ref 70–99)
Glucose-Capillary: 252 mg/dL — ABNORMAL HIGH (ref 70–99)

## 2018-07-24 MED ORDER — CLOPIDOGREL BISULFATE 75 MG PO TABS: 75.0000 mg | ORAL_TABLET | Freq: Every day | ORAL | 3 refills | Status: DC

## 2018-07-24 MED ORDER — TRAMADOL HCL 50 MG PO TABS: 25.0000 mg | ORAL_TABLET | Freq: Two times a day (BID) | ORAL | 0 refills | Status: DC | PRN

## 2018-07-24 MED FILL — Neomycin-Polymyxin-Dexamethasone Ophth Susp 0.1%: OPHTHALMIC | Qty: 5 | Status: AC

## 2018-07-24 NOTE — Progress Notes (Signed)
A-line in right radial removed this morning per order. Patient tolerated well. BP stable throughout the night. Will continue to monitor. Lajoyce Corners, RN

## 2018-07-24 NOTE — Discharge Summary (Signed)
Discharge Summary     FLORIDE HUTMACHER 01/04/1941 77 y.o. female  [id number]  Admission Date: 07/23/2018  Discharge Date: 07/24/18  Physician: Marty Heck, MD  Admission Diagnosis: left carotid stenosis   HPI:   This is a 77 y.o. female with history of hypertension, hyperlipidemia, diabetes that presents for second opinion for left internal carotid artery stenosis. Patient states she has been under surveillance for the last 2-1/2 years after a bruit was initially identified in her left neck. Her PCP has been following this over time. Most recently she has had issues with right-sided headaches as well as a flareup of her lupus and underwent a right temporal artery biopsy that showed medial artery calcification. She had some right visual changes, but denies any left-sided visual changes. During this subsequent work-up she was identified to have progression of her left carotid stenosis per the patient. She has had some intermittent right-sided hand weakness over the last 4 months that she describes as intermittent and usually last for less than a minute. Sometimes only happens every couple of weeks. The symptoms are only in her right hand when she is lifting objects. She has no CT evidence of acute stroke and does not believe she had an MRI. She was seen by a vascular surgeon at [facility] and was offered a transfemoral left carotid stent. No new symptoms since her evaluation at [facility]. Denies head or neck radiation. Was given a script for Plavix and has not started that yet.   Hospital Course:  The patient was admitted to the hospital and taken to the operating room on 07/23/2018 and underwent: 1.  Ultrasound-guided access of the right common femoral vein using ultrasound guidance 2.  Transcatheter placement of intravascular stent of left cervical carotid artery including angioplasty and radiologic supervision (transcarotid artery revascularization, TCAR)  Findings: High-grade  greater than 80% stenosis of the left internal carotid artery with disease extending down to the carotid bifurcation.  After predilation with a 5 mm balloon in the internal carotid artery and then stent placement with a 8 mm x 40 mm trans-carotid stent that was extended from ICA lesion into common carotid artery past bifurcation there was less than 30% residual stenosis.  The pt tolerated the procedure well and was transported to the PACU in good condition.   By POD 1, the pt neuro status was in tact.  She did have a sore throat the afternoon after surgery, but was improved by the next morning.  She swallowed without difficulty.    The remainder of the hospital course consisted of increasing mobilization and increasing intake of solids without difficulty.   Recent Labs    07/23/18 0930 07/24/18 0450  NA 141 139  K 3.4* 4.0  CL  --  110  CO2  --  21*  GLUCOSE 209* 221*  BUN  --  15  CALCIUM  --  9.1   Recent Labs    07/23/18 0930 07/24/18 0450  WBC  --  16.8*  HGB 9.9* 11.4*  HCT 29.0* 35.7*  PLT  --  213   No results for input(s): INR in the last 72 hours.     Discharge Diagnosis:  left carotid stenosis  Secondary Diagnosis: Patient Active Problem List   Diagnosis Date Noted  . Asymptomatic carotid artery stenosis without infarction, left 07/23/2018  . Amaurosis fugax 06/18/2018  . TIA (transient ischemic attack) 05/21/2018  . 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 Facial paresthesia 02/07/2018  . Pain and swelling  of right lower leg 12/25/2017  . Fibromyalgia   . 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 Intractable episodic headache 03/13/2017  . Numbness and tingling of both lower extremities 03/13/2017  . 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 Internal hordeolum of left eye 03/03/2017  . Fatty liver 02/13/2017  . Lightheadedness 12/28/2016  . LADA (latent autoimmune diabetes in adults), managed as type 1 ([place]) 10/03/2016  . Right  shoulder pain 08/15/2016  . Medicare annual wellness visit, subsequent 06/19/2015  . Health maintenance examination 06/19/2015  . Advanced care planning/counseling discussion 06/19/2015  . Carotid stenosis, symptomatic w/o infarct 06/19/2015  . Vitamin D deficiency   . Family history of premature CAD 06/02/2015  . Chest pain 05/26/2015  . Midline low back pain 09/25/2014  . 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 Hyperlipidemia 07/30/2014  . Essential hypertension 07/17/2014  . Hypothyroidism due to Hashimoto's thyroiditis 07/17/2014  . Adjustment disorder with mixed anxiety and depressed mood 07/17/2014  . Apnea, sleep 08/22/2012  . LBP (low back pain) 02/27/2012  . SLE (systemic lupus erythematosus) ([place]) 01/06/2012   Past Medical History:  Diagnosis Date  . Allergy   . ANA positive    positive ANA pattern 1 speckled  . Arthritis   . Carotid stenosis, asymptomatic 06/19/2015   5-63% RICA 89-37% LICA rpt 1 yr (11/4285)   . Dermatomyositis ([place])   . Diabetes mellitus without complication (HCC)    Type 1  . Family history of adverse reaction to anesthesia    brothr went into cardiac arrest from anectine  . Fibromyalgia    prior PCP  . GERD (gastroesophageal reflux disease)    prior PCP  . Glaucoma    Narrow angle  . History of blood clots    DVT, in 20s, none since  . History of chicken pox   . History of diverticulitis   . History of pericarditis 1986   with hospitalization  . History of pneumonia 2014  . History of shingles   . History of UTI   . Hyperlipidemia   . Hypertension   . Hypothyroidism   . Lupus (systemic lupus erythematosus) ([place])   . Mixed connective tissue disease ([place])   . PONV (postoperative nausea and vomiting)   . Raynaud's disease without gangrene   . Shoulder pain left   h/o RTC tendonitis and adhesive capsulitis  . Sjogren's syndrome ([place])   . Sleep apnea    prior PCP - no CPAP for about 10 yrs  . 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[place])   .  Vitamin D deficiency    prior PCP    Allergies as of 07/24/2018      Reactions   Iodinated Diagnostic Agents Other (See Comments)   Itching (severe) and chest tightness   Penicillins Anaphylaxis, Swelling, Rash, Other (See Comments)   Has patient had a PCN reaction causing immediate rash, facial/tongue/throat swelling, SOB or lightheadedness with hypotension: Yes Has patient had a PCN reaction causing severe rash involving mucus membranes or skin necrosis: Unknown Has patient had a PCN reaction that required hospitalization: Unknown Has patient had a PCN reaction occurring within the last 10 years: No If all of the above answers are "NO", then may proceed with Cephalosporin use.   Anectine [succinylcholine] Other (See Comments)   Brother went into cardiac arrest.   Codeine Nausea Only   Gabapentin Other (See Comments)   Gait abnormality   Influenza Vaccines Other (See Comments)   Muscle weakness; unable to walk  Nortriptyline Other (See Comments)   Eye swelling and mouth drawed up   Pamelor [nortriptyline Hcl] Other (See Comments)   Patient states caused her face to draw in together.   Valsartan Other (See Comments), Cough   Allergy to generic only, "Hacking" cough   Erythromycin Rash, Swelling   Sulfa Antibiotics Rash      Medication List    TAKE these medications   aspirin EC 81 MG tablet Take 81 mg by mouth daily.   BD ULTRA-FINE LANCETS lancets Use as instructed upto 6 times daily   [name] tablet Commonly known as:  PLAVIX Take 1 tablet (75 mg total) by mouth daily.   diclofenac sodium 1 % Gel Commonly known as:  VOLTAREN Apply 1 application topically 3 (three) times daily.   glucose blood test strip Use as instructed to check 4 times daily   insulin aspart 100 UNIT/ML injection Commonly known as:  novoLOG Use in an insulin pump upto 50 units per day What changed:  when to take this   Krill Oil 500 MG Caps Take 2 capsules (1,000 mg total) by  mouth daily. What changed:  how much to take   lisinopril 10 MG tablet Commonly known as:  PRINIVIL,ZESTRIL Take 10 mg by mouth daily.   naproxen sodium 220 MG tablet Commonly known as:  ALEVE Take 220 mg by mouth 2 (two) times daily as needed (for pain or headache).   neomycin-polymyxin-dexamethasone 0.1 % ophthalmic suspension Commonly known as:  MAXITROL Place 1 drop into both eyes daily.   nitroGLYCERIN 0.4 MG SL tablet Commonly known as:  NITROSTAT Place 1 tablet (0.4 mg total) under the tongue every 5 (five) minutes as needed for chest pain.   pantoprazole 40 MG tablet Commonly known as:  PROTONIX Take 1 tablet (40 mg total) by mouth daily.   rosuvastatin 10 MG tablet Commonly known as:  CRESTOR Take 1 tablet (10 mg total) by mouth every other day.   sucralfate 1 g tablet Commonly known as:  CARAFATE Take 1 g by mouth 3 (three) times daily as needed (for stomach ulcer).   SYNTHROID 75 MCG tablet Generic drug:  levothyroxine TAKE 1 TABLET BY MOUTH ONCE DAILY FOR 6 DAYS AND 1 AND 1/2 TABLETS ON THE 7TH DAY What changed:    how much to take  how to take this  when to take this  additional instructions   traMADol 50 MG tablet Commonly known as:  ULTRAM Take 0.5-1 tablets (25-50 mg total) by mouth 2 (two) times daily as needed for moderate pain.        [facility] Discharge Instructions Carotid Endarterectomy (CEA)  Please refer to the following instructions for your post-procedure care. Your surgeon or physician assistant will discuss any changes with you.  Activity  You are encouraged to walk as much as you can. You can slowly return to normal activities but must avoid strenuous activity and heavy lifting until your doctor tell you it's OK. Avoid activities such as vacuuming or swinging a golf club. You can drive after one week if you are comfortable and you are no longer taking prescription pain medications. It is normal  to feel tired for serval weeks after your surgery. It is also normal to have difficulty with sleep habits, eating, and bowel movements after surgery. These will go away with time.  Bathing/Showering  You may shower after you come home. Do not soak in a bathtub, hot tub, or swim  until the incision heals completely.  Incision Care  Shower every day. Clean your incision with mild soap and water. Pat the area dry with a clean towel. You do not need a bandage unless otherwise instructed. Do not apply any ointments or creams to your incision. You may have skin glue on your incision. Do not peel it off. It will come off on its own in about one week. Your incision may feel thickened and raised for several weeks after your surgery. This is normal and the skin will soften over time. For Men Only: It's OK to shave around the incision but do not shave the incision itself for 2 weeks. It is common to have numbness under your chin that could last for several months.  Diet  Resume your normal diet. There are no special food restrictions following this procedure. A low fat/low cholesterol diet is recommended for all patients with vascular disease. In order to heal from your surgery, it is CRITICAL to get adequate nutrition. Your body requires vitamins, minerals, and protein. Vegetables are the best source of vitamins and minerals. Vegetables also provide the perfect balance of protein. Processed food has little nutritional value, so try to avoid this.  Medications  Resume taking all of your medications unless your doctor or physician assistant tells you not to.  If your incision is causing pain, you may take over-the- counter pain relievers such as acetaminophen (Tylenol). If you were prescribed a stronger pain medication, please be aware these medications can cause nausea and constipation.  Prevent nausea by taking the medication with a snack or meal. Avoid constipation by drinking plenty of fluids and eating foods  with a high amount of fiber, such as fruits, vegetables, and grains.  Do not take Tylenol if you are taking prescription pain medications.  Follow Up  Our office will schedule a follow up appointment 2-3 weeks following discharge.  Please call us immediately for any of the following conditions  . Increased pain, redness, drainage (pus) from your incision site. . Fever of 101 degrees or higher. . If you should develop stroke (slurred speech, difficulty swallowing, weakness on one side of your body, loss of vision) you should call 911 and go to the nearest emergency room. .  Reduce your risk of vascular disease:  . Stop smoking. If you would like help call [facility] at 1-800-QUIT-NOW [phone]) or [place] at ([phone])[phone]. . Manage your cholesterol . Maintain a desired weight . Control your diabetes . Keep your blood pressure down .  If you have any questions, please call the office at [phone].  Prescriptions given: 1.   Tramadol 50mg  #8 No Refill 2.  Plavix 75mg  #30 3 refills  Disposition: home  Patient's condition: is Good  Follow up: 1. Dr. Carlis Abbott in 4 weeks with carotid duplex   Leontine Locket, PA-C [facility] [phone]   --- For [facility] Registry use ---   Modified Rankin score at D/C (0-6): 0  IV medication needed for:  1. Hypertension: No 2. Hypotension: No  Post-op Complications: No  1. Post-op CVA or TIA: No  If yes: Event classification (right eye, left eye, right cortical, left cortical, verterobasilar, other): n/a  If yes: Timing of event (intra-op, <6 hrs post-op, >=6 hrs post-op, unknown): n/a  2. CN injury: No  If yes: CN n/a injuried   3. Myocardial infarction: No  If yes: Dx by (EKG or clinical, Troponin): n/a  4.  CHF: No  5.  Dysrhythmia (new):  No  6. Wound infection: No  7. Reperfusion symptoms: No  8. Return to OR: No  If yes: return to OR for (bleeding, neurologic, other CEA incision, other):  n/a  Discharge medications: Statin use:  Yes ASA use:  Yes   Beta blocker use:  No ACE-Inhibitor use:  Yes  ARB use:  No CCB use: No P2Y12 Antagonist use: Yes, [ x] Plavix, [ ]  Plasugrel, [ ]  Ticlopinine, [ ]  Ticagrelor, [ ]  Other, [ ]  No for medical reason, [ ]  Non-compliant, [ ]  Not-indicated Anti-coagulant use:  No, [ ]  Warfarin, [ ]  Rivaroxaban, [ ]  Dabigatran,

## 2018-07-24 NOTE — Plan of Care (Signed)
  Problem: Pain Managment: Goal: General experience of comfort will improve Outcome: Progressing   Problem: Safety: Goal: Ability to remain free from injury will improve Outcome: Progressing   Problem: Education: Goal: Knowledge of General Education information will improve Description Including pain rating scale, medication(s)/side effects and non-pharmacologic comfort measures Outcome: Completed/Met

## 2018-07-24 NOTE — Progress Notes (Addendum)
Pt discharged home with daughter. IV and telemetry box removed. Pt and pt's daughter received discharge instructions and all questions were answered. Pt discharged with paper prescriptions. She reported that she preferred paper prescriptions over getting her meds filled at the transitional pharmacy. Pt left with all of her belongings. Pt discharged via wheelchair and was accompanied by this RN.   Ara Kussmaul BSN, RN

## 2018-07-24 NOTE — Progress Notes (Addendum)
  Progress Note    07/24/2018 7:44 AM 1 Day Post-Op  Subjective:  Throat feels better this morning; denies trouble swallowing   Afebrile HR 60's-100's  169'I-503'U systolic 88% RA  Vitals:   07/23/18 2357 07/24/18 0445  BP: (!) 131/54 (!) 109/42  Pulse: (!) 105   Resp: 17 12  Temp: 98 F (36.7 C) 97.7 F (36.5 C)  SpO2:  94%    Physical Exam: Cardiac:  regular Lungs:  Non labored Incisions:  Clean and dry Extremities:  Moving all extremities equally Neuro:  In tact; tongue is midline  CBC    Component Value Date/Time   WBC 16.8 (H) 07/24/2018 0450   RBC 4.34 07/24/2018 0450   HGB 11.4 (L) 07/24/2018 0450   HCT 35.7 (L) 07/24/2018 0450   PLT 213 07/24/2018 0450   MCV 82.3 07/24/2018 0450   MCH 26.3 07/24/2018 0450   MCHC 31.9 07/24/2018 0450   RDW 14.0 07/24/2018 0450   LYMPHSABS 2.0 01/30/2018 1718   MONOABS 0.6 01/30/2018 1718   EOSABS 0.1 01/30/2018 1718   BASOSABS 0.1 01/30/2018 1718    BMET    Component Value Date/Time   NA 139 07/24/2018 0450   NA 141 01/11/2016   K 4.0 07/24/2018 0450   K 4.2 01/11/2016   CL 110 07/24/2018 0450   CO2 21 (L) 07/24/2018 0450   GLUCOSE 221 (H) 07/24/2018 0450   BUN 15 07/24/2018 0450   CREATININE 0.93 07/24/2018 0450   CREATININE 0.89 01/11/2016   CALCIUM 9.1 07/24/2018 0450   GFRNONAA 58 (L) 07/24/2018 0450   GFRAA >60 07/24/2018 0450    INR    Component Value Date/Time   INR 1.04 07/16/2018 1039     Intake/Output Summary (Last 24 hours) at 07/24/2018 0744 Last data filed at 07/24/2018 0214 Gross per 24 hour  Intake 2390 ml  Output 1760 ml  Net 630 ml     Assessment:  77 y.o. female is s/p:  TCAR  1 Day Post-Op  Plan: -pt doing well this am and neuro in tact -Plavix for at least a month -needs to walk in the halls -blood sugars better this am-she will continue monitoring this as prior to discharge -discharge later this morning   Leontine Locket, PA-C [facility] [phone] 07/24/2018 7:44 AM  I have seen and evaluated the patient. I agree with the PA note as documented above. Throat pain improved - suspect trauma from intubation.  Neuro intact.  Neck incision looks great.  Ready for d/c home.  Instructed to continue aspirin, plavix, statin.  Will f/u in one month in clinic with carotid duplex.   Marty Heck, MD [facility] [place] Office: [phone] Pager: [phone]

## 2018-07-24 NOTE — Progress Notes (Signed)
PA called concerning throat pain and canker sore to upper lip. PA Sam and Dr. Carlis Abbott came and assessed pt. Pt given chloraseptic spray and pain medication. Pt expresses generalized anxiety. Impressed with OR and procedure but feels anxious due to difference in this intubation and last procedure.  Pt reassured and Dr. Carlis Abbott answered all questions and reassured patient. Pt resting with call bell within reach.  Will continue to monitor. Cassandra Emerald, RN

## 2018-07-24 NOTE — Consult Note (Signed)
   [facility] Inpatient Consult   07/24/2018  Cassandra Benson August 27, 1941 [id number]   Patient was assessed for [facility] for community services. Patient was previously active with [place] [facility].  Met with patient at bedside regarding being restarted with [facility] services. Patient states, "I am not trying to be rude but, I have been trying to get home and I am wobbly and need a walker."  Notified inpatient RNCM regarding what patient wanted.  Patient accepted a brochure and 24 hour nurse line magnet. Patient's primary care will provide the transition of care calls.  Will follow with General EMMI calls.   Of note, [facility] Care Management services does not replace or interfere with any services that are arranged by inpatient case management or social work. For additional questions or referrals please contact:   Natividad Brood, RN BSN [facility] Liaison  [phone] business mobile phone Toll free office [phone]

## 2018-07-24 NOTE — Progress Notes (Signed)
Inpatient Diabetes Program Recommendations  AACE/ADA: New Consensus Statement on Inpatient Glycemic Control (2015)  Target Ranges:  Prepandial:   less than 140 mg/dL      Peak postprandial:   less than 180 mg/dL (1-2 hours)      Critically ill patients:  140 - 180 mg/dL   Review of Glycemic Control  Diabetes history: DM 1 (LADA) Outpatient Diabetes medications: Insulin pump Pump settings: - basal rates: 12 am: 0.800 units/h 3 am: 0.875 9:30 am: 0.100 5 pm: 1.500 6:30 pm: 1.300 11:30 pm: 1.800  - ICR:  12 am: 10 () 6 pm: 8() - target: 130-130 except 6-8 am and 6 pm-12 am: 100-100 - ISF: 35 - Insulin on Board: 4h  Total daily dose: 30 units per day (up to 40 units for prescriptions)  Inpatient Diabetes Program Recommendations:    Noted patient for d/c later today.  Patient received Decadron 10 mg yesterday. Glucose trends should improve in the next 24-48 hours. Patient to continue to check glucose and bolus herself. Per her Endocrinology note, I believe she has pump settings for when she is on steroids.   Patient to contact Endocrinology office for pump setting adjustments if glucose trends continue to be elevated.  Thanks,  Tama Headings RN, MSN, BC-ADM Inpatient Diabetes Coordinator Team Pager [phone] (8a-5p)

## 2018-07-25 ENCOUNTER — Inpatient Hospital Stay: Admit: 2018-07-25 | Payer: PPO | Admitting: Vascular Surgery

## 2018-07-25 SURGERY — CAROTID PTA/STENT INTERVENTION
Anesthesia: Moderate Sedation | Laterality: Left

## 2018-07-27 ENCOUNTER — Other Ambulatory Visit: Payer: Self-pay

## 2018-07-27 NOTE — Patient Outreach (Addendum)
[facility]) Care Management  [place]   07/27/2018  CONLEY PAWLING April 01, 1941 [id number]   77 year old female outreached by [place] services for a 30 day post discharge medication review.  PMHx includes, but not limited to, hypertension, h/o carotid artery stenosis, fatty liver, hypothyroidism, LADA managed as type 1 diabetes, lupus and hyperlipidemia.  Subjective: Ms. Oros reports that she is feeling well after her recent procedure.  She states that her "sugars are back under control."  She reports having an insulin pump and checking her glucose as directed, with most recent values of 118 and 113 mg/dL.  She states that her recent increase in HgA1c was due to three rounds of prednisone that she had taken for lupus and an allergic reaction.  She declined [facility] RN diabetes Customer service manager.  Patient states she is calling PCP today to schedule a routine appointment.   Objective:  HgA1c 8.1% on 06/01/18 SCr 0.93 mg/dL on 07/24/18 Total cholesterol 214 mg/dL, HDL 43 mg/dL, LDL calc 147 mg/dL on 06/11/18  Current Medications: Current Outpatient Medications  Medication Sig Dispense Refill  . aspirin EC 81 MG tablet Take 81 mg by mouth daily.    . BD ULTRA-FINE LANCETS lancets Use as instructed upto 6 times daily 600 each 2  . clopidogrel (PLAVIX) 75 MG tablet Take 1 tablet (75 mg total) by mouth daily. 30 tablet 3  . glucose blood (CONTOUR NEXT TEST) test strip Use as instructed to check 4 times daily 400 each 5  . insulin aspart (NOVOLOG) 100 UNIT/ML injection Use in an insulin pump upto 50 units per day (Patient taking differently: See admin instructions. Use in an insulin pump upto 50 units per day) 2 vial 6  . Krill Oil 500 MG CAPS Take 2 capsules (1,000 mg total) by mouth daily. (Patient taking differently: Take 500 mg by mouth daily. )    . lisinopril (PRINIVIL,ZESTRIL) 10 MG tablet Take 10 mg by mouth daily.    . naproxen sodium (ALEVE) 220 MG tablet  Take 220 mg by mouth 2 (two) times daily as needed (for osteoarthritis).     Marland Kitchen neomycin-polymyxin-dexamethasone (MAXITROL) 0.1 % ophthalmic suspension Place 1 drop into both eyes daily.    . pantoprazole (PROTONIX) 40 MG tablet Take 1 tablet (40 mg total) by mouth daily. (Patient taking differently: Take 40 mg by mouth daily. Takes prn heart burn) 30 tablet 3  . rosuvastatin (CRESTOR) 10 MG tablet Take 1 tablet (10 mg total) by mouth every other day. (Patient taking differently: Take 10 mg by mouth daily. ) 30 tablet 6  . SYNTHROID 75 MCG tablet TAKE 1 TABLET BY MOUTH ONCE DAILY FOR 6 DAYS AND 1 AND 1/2 TABLETS ON THE 7TH DAY (Patient taking differently: Take 37.5-75 mcg by mouth See admin instructions. Take 37.5 mcg by mouth daily on Sunday. Take 75 mcg by mouth daily on all other days.) 90 tablet 3  . traMADol (ULTRAM) 50 MG tablet Take 0.5-1 tablets (25-50 mg total) by mouth 2 (two) times daily as needed for moderate pain. 8 tablet 0  . diclofenac sodium (VOLTAREN) 1 % GEL Apply 1 application topically 3 (three) times daily. (Patient not taking: Reported on 07/11/2018) 1 Tube 1  . nitroGLYCERIN (NITROSTAT) 0.4 MG SL tablet Place 1 tablet (0.4 mg total) under the tongue every 5 (five) minutes as needed for chest pain. (Patient not taking: Reported on 07/11/2018) 30 tablet 0  . sucralfate (CARAFATE) 1 g tablet Take  1 g by mouth 3 (three) times daily as needed (for stomach ulcer).     No current facility-administered medications for this visit.     Functional Status: In your present state of health, do you have any difficulty performing the following activities: 07/16/2018 04/06/2018  Hearing? N N  Vision? N N  Difficulty concentrating or making decisions? N N  Walking or climbing stairs? Y N  Dressing or bathing? N N  Doing errands, shopping? N -  Some recent data might be hidden    Fall/Depression Screening: Fall Risk  08/21/2017 08/09/2016 06/19/2015  Falls in the past year? No No No    PHQ 2/9 Scores 01/29/2018 08/21/2017 01/26/2017 08/09/2016 06/19/2015 06/19/2015 09/25/2014  PHQ - 2 Score 0 0 0 0 0 0 0   ASSESSMENT: Date Discharged from Hospital: 07/24/18 Date Medication Reconciliation Performed: 07/27/2018  New Medications at Discharge:   tramadol  Patient was recently discharged from hospital and all medications have been reviewed  Drugs sorted by system:  Cardiovascular: aspirin, clopidogrel, lisinopril, nitroglycerin, rosuvastatin  Gastrointestinal: pantoprazole, sucralfate  Endocrine:  Insulin aspart, synthroid  Topical: diclofenac gel, Maxitrol opth. susp.  Pain: naproxen, tramadol  Vitamins/Minerals/Supplements: krill oil  Other issues noted:  Nitroglycerin Patient reports that she needs a new prescription, but that she has not used this medication in a long time.  She states that she will request at her next office visit.  PLAN: Route to PCP, Dr. Danise Mina.  Joetta Manners, PharmD Clinical Pharmacist [place] [phone]

## 2018-07-30 NOTE — Progress Notes (Addendum)
BP 134/70 (BP Location: Left Arm, Patient Position: Sitting, Cuff Size: Normal)   Pulse 74   Temp 98.2 F (36.8 C) (Oral)   Ht 5\' 3"  (1.6 m)   Wt 154 lb 8 oz (70.1 kg)   SpO2 99%   BMI 27.37 kg/m    CC: f/u surgery visit Subjective:    Patient ID: Cassandra Benson, female    DOB: 01/27/1941, 77 y.o.   MRN: [id number]  HPI: Cassandra Benson is a 77 y.o. female presenting on 07/31/2018 for Follow-up (Here for carotid surgery f/u.) and Discuss Medication (Wants to discuss Protonix and sulcrafate. If she is to continue sulcrafate, needs refill. )   On plavix after recent carotid surgery (stenting of L carotid with angioplasty) for high grade >17% stenosis of LICA down to carotid bifurcation that improved to <30% residual stenosis - this caused stomach burning so we started pantoprazole. Planned f/u in 1 month with [facility].   Notes R eye vision has been affected. Planning to see ophthalmology. Sugars were elevated to 300s during hospitalization, now improving.   Relevant past medical, surgical, family and social history reviewed and updated as indicated. Interim medical history since our last visit reviewed. Allergies and medications reviewed and updated. Outpatient Medications Prior to Visit  Medication Sig Dispense Refill  . aspirin EC 81 MG tablet Take 81 mg by mouth daily.    . BD ULTRA-FINE LANCETS lancets Use as instructed upto 6 times daily 600 each 2  . benzocaine (ORAJEL) 10 % mucosal gel Use as directed 1 application in the mouth or throat 4 (four) times daily as needed for mouth pain.    Marland Kitchen clopidogrel (PLAVIX) 75 MG tablet Take 1 tablet (75 mg total) by mouth daily. 30 tablet 3  . diclofenac sodium (VOLTAREN) 1 % GEL Apply 1 application topically 3 (three) times daily. 1 Tube 1  . glucose blood (CONTOUR NEXT TEST) test strip Use as instructed to check 4 times daily 400 each 5  . insulin aspart (NOVOLOG) 100 UNIT/ML injection Use in an insulin pump upto 50 units per day (Patient  taking differently: See admin instructions. Use in an insulin pump upto 50 units per day) 2 vial 6  . Krill Oil 500 MG CAPS Take 2 capsules (1,000 mg total) by mouth daily. (Patient taking differently: Take 500 mg by mouth daily. )    . lisinopril (PRINIVIL,ZESTRIL) 10 MG tablet Take 10 mg by mouth daily.    . naproxen sodium (ALEVE) 220 MG tablet Take 220 mg by mouth 2 (two) times daily as needed (for osteoarthritis).     Marland Kitchen neomycin-polymyxin-dexamethasone (MAXITROL) 0.1 % ophthalmic suspension Place 1 drop into both eyes daily.    . pantoprazole (PROTONIX) 40 MG tablet Take 1 tablet (40 mg total) by mouth daily. (Patient taking differently: Take 40 mg by mouth daily. Takes prn heart burn) 30 tablet 3  . rosuvastatin (CRESTOR) 10 MG tablet Take 1 tablet (10 mg total) by mouth every other day. (Patient taking differently: Take 10 mg by mouth daily. ) 30 tablet 6  . SYNTHROID 75 MCG tablet TAKE 1 TABLET BY MOUTH ONCE DAILY FOR 6 DAYS AND 1 AND 1/2 TABLETS ON THE 7TH DAY (Patient taking differently: Take 37.5-75 mcg by mouth See admin instructions. Take 37.5 mcg by mouth daily on Sunday. Take 75 mcg by mouth daily on all other days.) 90 tablet 3  . traMADol (ULTRAM) 50 MG tablet Take 0.5-1 tablets (25-50 mg total) by mouth 2 (  two) times daily as needed for moderate pain. 8 tablet 0  . nitroGLYCERIN (NITROSTAT) 0.4 MG SL tablet Place 1 tablet (0.4 mg total) under the tongue every 5 (five) minutes as needed for chest pain. 30 tablet 0  . sucralfate (CARAFATE) 1 g tablet Take 1 g by mouth 3 (three) times daily as needed (for stomach ulcer).     No facility-administered medications prior to visit.      Per HPI unless specifically indicated in ROS section below Review of Systems     Objective:    BP 134/70 (BP Location: Left Arm, Patient Position: Sitting, Cuff Size: Normal)   Pulse 74   Temp 98.2 F (36.8 C) (Oral)   Ht 5\' 3"  (1.6 m)   Wt 154 lb 8 oz (70.1 kg)   SpO2 99%   BMI 27.37 kg/m   Wt  Readings from Last 3 Encounters:  07/31/18 154 lb 8 oz (70.1 kg)  07/23/18 152 lb 1.9 oz (69 kg)  07/16/18 153 lb 6.4 oz (69.6 kg)    Physical Exam  Constitutional: She appears well-developed and well-nourished. No distress.  HENT:  Mouth/Throat: Oropharynx is clear and moist. No oropharyngeal exudate.  Cardiovascular: Normal rate, regular rhythm and normal heart sounds.  No murmur heard. Pulmonary/Chest: Effort normal and breath sounds normal. No respiratory distress. She has no wheezes. She has no rales.  Musculoskeletal: She exhibits no edema.  Skin: Skin is warm and dry.  Healing incision L upper chest without surrounding erythema/drainage  Psychiatric: She has a normal mood and affect.  Nursing note and vitals reviewed.     Assessment & Plan:  Over 25 minutes were spent face-to-face with the patient during this encounter and >50% of that time was spent on counseling and coordination of care  Problem List Items Addressed This Visit    Monckeberg's medial sclerosis   Relevant Medications   nitroGLYCERIN (NITROSTAT) 0.4 MG SL tablet   Intractable episodic headache    HAs have improved since procedure.      Carotid stenosis, symptomatic w/o infarct - Primary    Recent carotid stent placement 06/2018. Pt recovering well. Upcoming [facility] f/u appt at end of month.  Planned plavix for at least 3 months - this caused reflux so we've started protonix. Recommended continue PPI while on plavix. H/o PUD.       Relevant Medications   nitroGLYCERIN (NITROSTAT) 0.4 MG SL tablet       Meds ordered this encounter  Medications  . nitroGLYCERIN (NITROSTAT) 0.4 MG SL tablet    Sig: Place 1 tablet (0.4 mg total) under the tongue every 5 (five) minutes as needed for chest pain.    Dispense:  30 tablet    Refill:  1   No orders of the defined types were placed in this encounter.   Follow up plan: Return in about 4 weeks (around 08/28/2018), or if symptoms worsen or fail to improve, for  follow up visit.  [name], MD

## 2018-07-31 ENCOUNTER — Ambulatory Visit (INDEPENDENT_AMBULATORY_CARE_PROVIDER_SITE_OTHER): Payer: PPO | Admitting: Family Medicine

## 2018-07-31 ENCOUNTER — Encounter: Payer: Self-pay | Admitting: Family Medicine

## 2018-07-31 VITALS — BP 134/70 | HR 74 | Temp 98.2°F | Ht 63.0 in | Wt 154.5 lb

## 2018-07-31 DIAGNOSIS — R519 Headache, unspecified: Secondary | ICD-10-CM

## 2018-07-31 DIAGNOSIS — R51 Headache: Secondary | ICD-10-CM | POA: Diagnosis not present

## 2018-07-31 DIAGNOSIS — I6522 Occlusion and stenosis of left carotid artery: Secondary | ICD-10-CM | POA: Diagnosis not present

## 2018-07-31 DIAGNOSIS — I7090 Unspecified atherosclerosis: Secondary | ICD-10-CM | POA: Diagnosis not present

## 2018-07-31 MED ORDER — NITROGLYCERIN 0.4 MG SL SUBL: 0.4000 mg | SUBLINGUAL_TABLET | SUBLINGUAL | 1 refills | Status: DC | PRN

## 2018-07-31 NOTE — Patient Instructions (Addendum)
You are doing well today! Continue protonix daily while on plavix.  Return in 4-6 weeks for wellness visit/physical.

## 2018-07-31 NOTE — Assessment & Plan Note (Addendum)
Recent carotid stent placement 06/2018. Pt recovering well. Upcoming [facility] f/u appt at end of month.  Planned plavix for at least 3 months - this caused reflux so we've started protonix. Recommended continue PPI while on plavix. H/o PUD.

## 2018-07-31 NOTE — Assessment & Plan Note (Signed)
HAs have improved since procedure.

## 2018-08-03 ENCOUNTER — Ambulatory Visit: Payer: PPO | Admitting: Family Medicine

## 2018-08-27 ENCOUNTER — Telehealth: Payer: Self-pay | Admitting: Cardiovascular Disease

## 2018-08-27 NOTE — Telephone Encounter (Signed)
Patient calling States that she just had surgery on her carotid on 11/28 Would like to know if it will still be necessary to do carotid ultrasound on 12/5 Please call to discuss

## 2018-08-28 ENCOUNTER — Other Ambulatory Visit: Payer: Self-pay | Admitting: Family Medicine

## 2018-08-28 ENCOUNTER — Ambulatory Visit (INDEPENDENT_AMBULATORY_CARE_PROVIDER_SITE_OTHER): Payer: Self-pay | Admitting: Vascular Surgery

## 2018-08-28 ENCOUNTER — Other Ambulatory Visit: Payer: Self-pay

## 2018-08-28 ENCOUNTER — Other Ambulatory Visit: Payer: Self-pay | Admitting: Vascular Surgery

## 2018-08-28 ENCOUNTER — Encounter: Payer: Self-pay | Admitting: Vascular Surgery

## 2018-08-28 DIAGNOSIS — K760 Fatty (change of) liver, not elsewhere classified: Secondary | ICD-10-CM

## 2018-08-28 DIAGNOSIS — E063 Autoimmune thyroiditis: Principal | ICD-10-CM

## 2018-08-28 DIAGNOSIS — E782 Mixed hyperlipidemia: Secondary | ICD-10-CM

## 2018-08-28 DIAGNOSIS — E038 Other specified hypothyroidism: Secondary | ICD-10-CM

## 2018-08-28 DIAGNOSIS — I6522 Occlusion and stenosis of left carotid artery: Secondary | ICD-10-CM

## 2018-08-28 DIAGNOSIS — E559 Vitamin D deficiency, unspecified: Secondary | ICD-10-CM

## 2018-08-28 DIAGNOSIS — E139 Other specified diabetes mellitus without complications: Secondary | ICD-10-CM

## 2018-08-28 MED ORDER — CLOPIDOGREL BISULFATE 75 MG PO TABS: 75.0000 mg | ORAL_TABLET | Freq: Every day | ORAL | 3 refills | Status: DC

## 2018-08-28 MED ORDER — CLOPIDOGREL BISULFATE 75 MG PO TABS: 75.0000 mg | ORAL_TABLET | Freq: Every day | ORAL | 6 refills | Status: DC

## 2018-08-28 NOTE — Progress Notes (Signed)
Patient name: Cassandra Benson MRN: [id number] DOB: 1940/10/07 Sex: female  REASON FOR VISIT: Postop status post left TCAR  HPI: Cassandra Benson is a 77 y.o. female that presents for 1 month postop check status post left TCAR for a asymptomatic high-grade stenosis of the left ICA.  Patient reports no new issues in the interim.  She feels her neck incision is healing without any issues.  She is taking her Plavix and aspirin.  She has had no new focal neurologic symptoms and/or vision loss.  Overall she seems very pleased.  Past Medical History:  Diagnosis Date  . Allergy   . ANA positive    positive ANA pattern 1 speckled  . Arthritis   . Carotid stenosis, asymptomatic 06/19/2015   6-01% RICA 09-32% LICA rpt 1 yr (11/5571)   . Dermatomyositis ([place])   . Diabetes mellitus without complication (HCC)    Type 1  . Family history of adverse reaction to anesthesia    brothr went into cardiac arrest from anectine  . Fibromyalgia    prior PCP  . GERD (gastroesophageal reflux disease)    prior PCP  . Glaucoma    Narrow angle  . History of blood clots    DVT, in 20s, none since  . History of chicken pox   . History of diverticulitis   . History of pericarditis 1986   with hospitalization  . History of pneumonia 2014  . History of shingles   . History of UTI   . Hyperlipidemia   . Hypertension   . Hypothyroidism   . Lupus (systemic lupus erythematosus) ([place])   . Mixed connective tissue disease ([place])   . PONV (postoperative nausea and vomiting)   . Raynaud's disease without gangrene   . Shoulder pain left   h/o RTC tendonitis and adhesive capsulitis  . Sjogren's syndrome ([place])   . Sleep apnea    prior PCP - no CPAP for about 10 yrs  . 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[place])   . Vitamin D deficiency    prior PCP    Past Surgical History:  Procedure Laterality Date  . ABDOMINAL HYSTERECTOMY  1978   fibroids and menorrhagia, ovaries remain  . ARTERY BIOPSY Right 04/06/2018   Procedure:  BIOPSY TEMPORAL ARTERY RIGHT;  Surgeon: Beverly Gust, MD;  Location: [place];  Service: ENT;  Laterality: Right;  Diabetic - insulin pump sleep apnea  . [facility] normal per patient  . COLONOSCOPY WITH ESOPHAGOGASTRODUODENOSCOPY (EGD)  03/2007   2 ulcers, benign polyp, rpt 5 yrs ([place], [place])  . PARTIAL HIP ARTHROPLASTY  2013   Right hip replacement  . TONSILLECTOMY AND ADENOIDECTOMY    . TRANSCAROTID ARTERY REVASCULARIZATION Left 07/23/2018   Procedure: TRANSCAROTID ARTERY REVASCULARIZATION;  Surgeon: Marty Heck, MD;  Location: [place];  Service: Vascular;  Laterality: Left;  . TUBAL LIGATION    . VAGINAL DELIVERY     x2, no complications    Family History  Problem Relation Age of Onset  . CAD Mother 61       MI, aortic valve issues  . COPD Mother   . Lupus Mother   . Graves' disease Mother   . Rheum arthritis Mother   . CAD Father 58       CABG x2, aortic valve replacement  . Stroke Sister   . Alcohol abuse Brother   . CAD Brother 50       MI  .  Stroke Brother   . Seizures Son   . COPD Brother   . CAD Brother 59       stent  . Diabetes Brother   . Depression Grandchild   . Cancer Maternal Aunt        breast  . Breast cancer Maternal Aunt   . Diabetes Sister        deceased  . CAD Sister 19       stents  . Breast cancer Maternal Aunt     SOCIAL HISTORY: Social History   Tobacco Use  . Smoking status: Never Smoker  . Smokeless tobacco: Never Used  Substance Use Topics  . Alcohol use: No    Allergies  Allergen Reactions  . Iodinated Diagnostic Agents Other (See Comments)    Itching (severe) and chest tightness  . Penicillins Anaphylaxis, Swelling, Rash and Other (See Comments)    Has patient had a PCN reaction causing immediate rash, facial/tongue/throat swelling, SOB or lightheadedness with hypotension: Yes Has patient had a PCN reaction causing severe rash involving mucus membranes  or skin necrosis: Unknown Has patient had a PCN reaction that required hospitalization: Unknown Has patient had a PCN reaction occurring within the last 10 years: No If all of the above answers are "NO", then may proceed with Cephalosporin use.   Marland Kitchen Anectine [Succinylcholine] Other (See Comments)    Brother went into cardiac arrest.  . Codeine Nausea Only  . Gabapentin Other (See Comments)    Gait abnormality  . Influenza Vaccines Other (See Comments)    Muscle weakness; unable to walk  . Nortriptyline Other (See Comments)    Eye swelling and mouth drawed up  . Pamelor [Nortriptyline Hcl] Other (See Comments)    Patient states caused her face to draw in together.  . Valsartan Other (See Comments) and Cough    Allergy to generic only, "Hacking" cough  . Erythromycin Rash and Swelling  . Sulfa Antibiotics Rash    Current Outpatient Medications  Medication Sig Dispense Refill  . aspirin EC 81 MG tablet Take 81 mg by mouth daily.    . BD ULTRA-FINE LANCETS lancets Use as instructed upto 6 times daily 600 each 2  . benzocaine (ORAJEL) 10 % mucosal gel Use as directed 1 application in the mouth or throat 4 (four) times daily as needed for mouth pain.    Marland Kitchen clopidogrel (PLAVIX) 75 MG tablet Take 1 tablet (75 mg total) by mouth daily. 30 tablet 3  . diclofenac sodium (VOLTAREN) 1 % GEL Apply 1 application topically 3 (three) times daily. 1 Tube 1  . glucose blood (CONTOUR NEXT TEST) test strip Use as instructed to check 4 times daily 400 each 5  . insulin aspart (NOVOLOG) 100 UNIT/ML injection Use in an insulin pump upto 50 units per day (Patient taking differently: See admin instructions. Use in an insulin pump upto 50 units per day) 2 vial 6  . Krill Oil 500 MG CAPS Take 2 capsules (1,000 mg total) by mouth daily. (Patient taking differently: Take 500 mg by mouth daily. )    . lisinopril (PRINIVIL,ZESTRIL) 10 MG tablet Take 10 mg by mouth daily.    . naproxen sodium (ALEVE) 220 MG tablet  Take 220 mg by mouth 2 (two) times daily as needed (for osteoarthritis).     Marland Kitchen neomycin-polymyxin-dexamethasone (MAXITROL) 0.1 % ophthalmic suspension Place 1 drop into both eyes daily.    . nitroGLYCERIN (NITROSTAT) 0.4 MG SL tablet Place 1 tablet (0.4 mg total)  under the tongue every 5 (five) minutes as needed for chest pain. 30 tablet 1  . pantoprazole (PROTONIX) 40 MG tablet Take 1 tablet (40 mg total) by mouth daily. (Patient taking differently: Take 40 mg by mouth daily. Takes prn heart burn) 30 tablet 3  . rosuvastatin (CRESTOR) 10 MG tablet Take 1 tablet (10 mg total) by mouth every other day. (Patient taking differently: Take 10 mg by mouth daily. ) 30 tablet 6  . SYNTHROID 75 MCG tablet TAKE 1 TABLET BY MOUTH ONCE DAILY FOR 6 DAYS AND 1 AND 1/2 TABLETS ON THE 7TH DAY (Patient taking differently: Take 37.5-75 mcg by mouth See admin instructions. Take 37.5 mcg by mouth daily on Sunday. Take 75 mcg by mouth daily on all other days.) 90 tablet 3  . traMADol (ULTRAM) 50 MG tablet Take 0.5-1 tablets (25-50 mg total) by mouth 2 (two) times daily as needed for moderate pain. 8 tablet 0  . clopidogrel (PLAVIX) 75 MG tablet Take 1 tablet (75 mg total) by mouth daily. 30 tablet 6   No current facility-administered medications for this visit.     REVIEW OF SYSTEMS:  [X]  denotes positive finding, [ ]  denotes negative finding Cardiac  Comments:  Chest pain or chest pressure:    Shortness of breath upon exertion:    Short of breath when lying flat:    Irregular heart rhythm:        Vascular    Pain in calf, thigh, or hip brought on by ambulation:    Pain in feet at night that wakes you up from your sleep:     Blood clot in your veins:    Leg swelling:         Pulmonary    Oxygen at home:    Productive cough:     Wheezing:         Neurologic    Sudden weakness in arms or legs:     Sudden numbness in arms or legs:     Sudden onset of difficulty speaking or slurred speech:    Temporary  loss of vision in one eye:     Problems with dizziness:         Gastrointestinal    Blood in stool:     Vomited blood:         Genitourinary    Burning when urinating:     Blood in urine:        Psychiatric    Major depression:         Hematologic    Bleeding problems:    Problems with blood clotting too easily:        Skin    Rashes or ulcers:        Constitutional    Fever or chills:      PHYSICAL EXAM: Vitals:   08/28/18 0906  BP: (!) 158/78  Pulse: 93  Resp: 18  Temp: (!) 97.1 F (36.2 C)  TempSrc: Oral  SpO2: 96%  Weight: 152 lb 14.4 oz (69.4 kg)  Height: 5\' 3"  (1.6 m)    GENERAL: The patient is a well-nourished female, in no acute distress. The vital signs are documented above. CARDIAC: There is a regular rate and rhythm.  VASCULAR:  No carotid bruit Left neck incision well healed, no hematoma etc. PULMONARY: There is good air exchange bilaterally without wheezing or rales. NEUROLOGIC: No focal weakness or paresthesias are detected.  CN II-XII grossly intact.  DATA:   No new  imaging to review.  Assessment/Plan:  Overall Ms. Iiams seems to be doing very well now approximately one month status post left TCAR for asymptomatic high-grade stenosis.  Her neck incision is well-healed and she has had no new neurologic symptoms.  Discussed plan for surveillance at this time and we will see her back in 6 months with a carotid duplex to monitor her left carotid stent and also for contralateral surveillance (no significant contralateral disease on previous CTA).  Discussed continuing the aspirin and Plavix for now until we have a baseline duplex study to ensure the stent is widely patent.   Marty Heck, MD [facility] Office: [phone] Pager: [place]

## 2018-08-28 NOTE — Telephone Encounter (Signed)
I called and spoke with the patient. She had surgery on her left internal carotid artery on 07/23/18. She acutally followed up with Dr. Carlis Abbott from vascular surgery today and his notes mention he will repeat an ultrasound study in 6 months.  The patient is currently scheduled to follow up with our office on 08/30/18 for a carotid duplex. I have advised her we can cancel this as she is now being followed by vascular surgery.   She voices understanding.

## 2018-08-29 ENCOUNTER — Ambulatory Visit (INDEPENDENT_AMBULATORY_CARE_PROVIDER_SITE_OTHER): Payer: PPO

## 2018-08-29 VITALS — BP 122/70 | HR 77 | Temp 97.9°F | Ht 63.75 in | Wt 151.2 lb

## 2018-08-29 DIAGNOSIS — E782 Mixed hyperlipidemia: Secondary | ICD-10-CM

## 2018-08-29 DIAGNOSIS — E559 Vitamin D deficiency, unspecified: Secondary | ICD-10-CM | POA: Diagnosis not present

## 2018-08-29 DIAGNOSIS — K760 Fatty (change of) liver, not elsewhere classified: Secondary | ICD-10-CM

## 2018-08-29 DIAGNOSIS — E063 Autoimmune thyroiditis: Secondary | ICD-10-CM

## 2018-08-29 DIAGNOSIS — E038 Other specified hypothyroidism: Secondary | ICD-10-CM

## 2018-08-29 DIAGNOSIS — Z0000 Encounter for general adult medical examination without abnormal findings: Secondary | ICD-10-CM

## 2018-08-29 LAB — CBC WITH DIFFERENTIAL/PLATELET
Basophils Absolute: 0.1 10*3/uL (ref 0.0–0.1)
Basophils Relative: 1.3 % (ref 0.0–3.0)
Eosinophils Absolute: 0.2 10*3/uL (ref 0.0–0.7)
Eosinophils Relative: 2.8 % (ref 0.0–5.0)
HCT: 40.5 % (ref 36.0–46.0)
Hemoglobin: 13.3 g/dL (ref 12.0–15.0)
Lymphocytes Relative: 28.5 % (ref 12.0–46.0)
Lymphs Abs: 1.9 10*3/uL (ref 0.7–4.0)
MCHC: 32.9 g/dL (ref 30.0–36.0)
MCV: 80.5 fl (ref 78.0–100.0)
Monocytes Absolute: 0.5 10*3/uL (ref 0.1–1.0)
Monocytes Relative: 7.5 % (ref 3.0–12.0)
Neutro Abs: 4 10*3/uL (ref 1.4–7.7)
Neutrophils Relative %: 59.9 % (ref 43.0–77.0)
Platelets: 208 10*3/uL (ref 150.0–400.0)
RBC: 5.03 Mil/uL (ref 3.87–5.11)
RDW: 14.6 % (ref 11.5–15.5)
WBC: 6.6 10*3/uL (ref 4.0–10.5)

## 2018-08-29 LAB — COMPREHENSIVE METABOLIC PANEL
ALK PHOS: 71 U/L (ref 39–117)
ALT: 12 U/L (ref 0–35)
AST: 12 U/L (ref 0–37)
Albumin: 4 g/dL (ref 3.5–5.2)
BILIRUBIN TOTAL: 0.3 mg/dL (ref 0.2–1.2)
BUN: 9 mg/dL (ref 6–23)
CALCIUM: 9.4 mg/dL (ref 8.4–10.5)
CO2: 28 mEq/L (ref 19–32)
Chloride: 109 mEq/L (ref 96–112)
Creatinine, Ser: 0.85 mg/dL (ref 0.40–1.20)
GFR: 69.08 mL/min (ref >60.00)
Glucose, Bld: 133 mg/dL — ABNORMAL HIGH (ref 70–99)
Potassium: 4.3 mEq/L (ref 3.5–5.1)
Sodium: 144 mEq/L (ref 135–145)
Total Protein: 6.6 g/dL (ref 6.0–8.3)

## 2018-08-29 LAB — LIPID PANEL
Cholesterol: 155 mg/dL (ref 0–200)
HDL: 43.3 mg/dL (ref >39.00)
LDL Cholesterol: 88 mg/dL (ref 0–99)
NonHDL: 111.46
Total CHOL/HDL Ratio: 4
Triglycerides: 115 mg/dL (ref 0.0–149.0)
VLDL: 23 mg/dL (ref 0.0–40.0)

## 2018-08-29 LAB — TSH: TSH: 0.09 u[IU]/mL — ABNORMAL LOW (ref 0.35–4.50)

## 2018-08-29 LAB — VITAMIN D 25 HYDROXY (VIT D DEFICIENCY, FRACTURES): VITD: 36.68 ng/mL (ref 30.00–100.00)

## 2018-08-29 LAB — T4, FREE: Free T4: 0.76 ng/dL (ref 0.60–1.60)

## 2018-08-29 NOTE — Progress Notes (Signed)
Subjective:   Cassandra Benson is a 77 y.o. female who presents for Medicare Annual (Subsequent) preventive examination.  Review of Systems:  N/A Cardiac Risk Factors include: advanced age (>82men, >57 women);diabetes mellitus;dyslipidemia;hypertension     Objective:     Vitals: BP 122/70 (BP Location: Right Arm, Patient Position: Sitting, Cuff Size: Normal)   Pulse 77   Temp 97.9 F (36.6 C) (Oral)   Ht 5' 3.75" (1.619 m) Comment: no shoes  Wt 151 lb 4 oz (68.6 kg)   SpO2 97%   BMI 26.17 kg/m   Body mass index is 26.17 kg/m.  Advanced Directives 08/29/2018 07/16/2018 07/03/2018 04/06/2018 10/16/2017 10/01/2017 01/26/2017  Does Patient Have a Medical Advance Directive? Yes Yes Yes Yes Yes Yes Yes  Type of Paramedic of [place];Living will [place]Living will [place];Living will Healthcare Power of [place];Living will Living will [place];Living will  Does patient want to make changes to medical advance directive? - No - Patient declined - No - Patient declined - - No - Patient declined  Copy of [employer] in Chart? Yes - validated most recent copy scanned in chart (See row information) Yes Yes Yes Yes - No - copy requested  Would patient like information on creating a medical advance directive? - - - - - No - Patient declined No - Patient declined    Tobacco Social History   Tobacco Use  Smoking Status Never Smoker  Smokeless Tobacco Never Used     Counseling given: No   Clinical Intake:  Pre-visit preparation completed: Yes  Pain : No/denies pain Pain Score: 0-No pain     Nutritional Status: BMI 25 -29 Overweight Nutritional Risks: None Diabetes: Yes CBG done?: No Did pt. bring in CBG monitor from home?: No  How often do you need to have someone help you when you read instructions, pamphlets, or other written materials from your  doctor or pharmacy?: 1 - Never What is the last grade level you completed in school?: 12th grade + 3 yrs college   Interpreter Needed?: No  Comments: pt is a widow and lives alone Information entered by :: LPinson, LPN  Past Medical History:  Diagnosis Date  . Allergy   . ANA positive    positive ANA pattern 1 speckled  . Arthritis   . Carotid stenosis, asymptomatic 06/19/2015   3-97% RICA 67-34% LICA rpt 1 yr (09/9377)   . Dermatomyositis ([place])   . Diabetes mellitus without complication (HCC)    Type 1  . Family history of adverse reaction to anesthesia    brothr went into cardiac arrest from anectine  . Fibromyalgia    prior PCP  . GERD (gastroesophageal reflux disease)    prior PCP  . Glaucoma    Narrow angle  . History of blood clots    DVT, in 20s, none since  . History of chicken pox   . History of diverticulitis   . History of pericarditis 1986   with hospitalization  . History of pneumonia 2014  . History of shingles   . History of UTI   . Hyperlipidemia   . Hypertension   . Hypothyroidism   . Lupus (systemic lupus erythematosus) ([place])   . Mixed connective tissue disease ([place])   . PONV (postoperative nausea and vomiting)   . Raynaud's disease without gangrene   . Shoulder pain left   h/o RTC tendonitis and  adhesive capsulitis  . Sjogren's syndrome ([place])   . Sleep apnea    prior PCP - no CPAP for about 10 yrs  . 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[place])   . Vitamin D deficiency    prior PCP   Past Surgical History:  Procedure Laterality Date  . ABDOMINAL HYSTERECTOMY  1978   fibroids and menorrhagia, ovaries remain  . ARTERY BIOPSY Right 04/06/2018   Procedure: BIOPSY TEMPORAL ARTERY RIGHT;  Surgeon: Beverly Gust, MD;  Location: [place] [facility];  Service: ENT;  Laterality: Right;  Diabetic - insulin pump sleep apnea  . [facility] normal per patient  . COLONOSCOPY WITH ESOPHAGOGASTRODUODENOSCOPY (EGD)  03/2007   2 ulcers,  benign polyp, rpt 5 yrs ([place], [place])  . PARTIAL HIP ARTHROPLASTY  2013   Right hip replacement  . TONSILLECTOMY AND ADENOIDECTOMY    . TRANSCAROTID ARTERY REVASCULARIZATION Left 07/23/2018   Procedure: TRANSCAROTID ARTERY REVASCULARIZATION;  Surgeon: Marty Heck, MD;  Location: [facility];  Service: Vascular;  Laterality: Left;  . TUBAL LIGATION    . VAGINAL DELIVERY     x2, no complications   Family History  Problem Relation Age of Onset  . CAD Mother 22       MI, aortic valve issues  . COPD Mother   . Lupus Mother   . Graves' disease Mother   . Rheum arthritis Mother   . CAD Father 13       CABG x2, aortic valve replacement  . Stroke Sister   . Alcohol abuse Brother   . CAD Brother 56       MI  . Stroke Brother   . Seizures Son   . COPD Brother   . CAD Brother 14       stent  . Diabetes Brother   . Depression Grandchild   . Cancer Maternal Aunt        breast  . Breast cancer Maternal Aunt   . Diabetes Sister        deceased  . CAD Sister 43       stents  . Breast cancer Maternal Aunt    Social History   Socioeconomic History  . Marital status: Widowed    Spouse name: Not on file  . Number of children: Not on file  . Years of education: Not on file  . Highest education level: Not on file  Occupational History  . Not on file  Social Needs  . Financial resource strain: Not on file  . Food insecurity:    Worry: Not on file    Inability: Not on file  . Transportation needs:    Medical: Not on file    Non-medical: Not on file  Tobacco Use  . Smoking status: Never Smoker  . Smokeless tobacco: Never Used  Substance and Sexual Activity  . Alcohol use: No  . Drug use: No  . Sexual activity: Not on file  Lifestyle  . Physical activity:    Days per week: Not on file    Minutes per session: Not on file  . Stress: Not on file  Relationships  . Social connections:    Talks on phone: Not on file    Gets together: Not on file    Attends  religious service: Not on file    Active member of club or organization: Not on file    Attends meetings of clubs or organizations: Not on file    Relationship status:  Not on file  Other Topics Concern  . Not on file  Social History Narrative   Lives in [place] now. Recently moved from [place]. Widow - husband decreased 01/2016 of metastatic colon CA   No pets.   Mother of Cassandra Benson.   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 aspirin EC 81 MG tablet Take 81 mg by mouth daily.  . BD ULTRA-FINE LANCETS lancets Use as instructed upto 6 times daily  . benzocaine (ORAJEL) 10 % mucosal gel Use as directed 1 application in the mouth or throat 4 (four) times daily as needed for mouth pain.  Marland Kitchen clopidogrel (PLAVIX) 75 MG tablet Take 1 tablet (75 mg total) by mouth daily.  . clopidogrel (PLAVIX) 75 MG tablet Take 1 tablet (75 mg total) by mouth daily.  . diclofenac sodium (VOLTAREN) 1 % GEL Apply 1 application topically 3 (three) times daily.  Marland Kitchen glucose blood (CONTOUR NEXT TEST) test strip Use as instructed to check 4 times daily  . insulin aspart (NOVOLOG) 100 UNIT/ML injection Use in an insulin pump upto 50 units per day (Patient taking differently: See admin instructions. Use in an insulin pump upto 50 units per day)  . Krill Oil 500 MG CAPS Take 2 capsules (1,000 mg total) by mouth daily. (Patient taking differently: Take 500 mg by mouth daily. )  . lisinopril (PRINIVIL,ZESTRIL) 10 MG tablet Take 10 mg by mouth daily.  . naproxen sodium (ALEVE) 220 MG tablet Take 220 mg by mouth 2 (two) times daily as needed (for osteoarthritis).   Marland Kitchen neomycin-polymyxin-dexamethasone (MAXITROL) 0.1 % ophthalmic suspension Place 1 drop  into both eyes daily.  . nitroGLYCERIN (NITROSTAT) 0.4 MG SL tablet Place 1 tablet (0.4 mg total) under the tongue every 5 (five) minutes as needed for chest pain.  . pantoprazole (PROTONIX) 40 MG tablet Take 1 tablet (40 mg total) by mouth daily. (Patient taking differently: Take 40 mg by mouth daily. Takes prn heart burn)  . rosuvastatin (CRESTOR) 10 MG tablet Take 1 tablet (10 mg total) by mouth every other day. (Patient taking differently: Take 10 mg by mouth daily. )  . SYNTHROID 75 MCG tablet TAKE 1 TABLET BY MOUTH ONCE DAILY FOR 6 DAYS AND 1 AND 1/2 TABLETS ON THE 7TH DAY (Patient taking differently: Take 37.5-75 mcg by mouth See admin instructions. Take 37.5 mcg by mouth daily on Sunday. Take 75 mcg by mouth daily on all other days.)  . traMADol (ULTRAM) 50 MG tablet Take 0.5-1 tablets (25-50 mg total) by mouth 2 (two) times daily as needed for moderate pain.   No facility-administered encounter medications on file as of 08/29/2018.     Activities of Daily Living In your present state of health, do you have any difficulty performing the following activities: 08/29/2018 07/16/2018  Hearing? N N  Vision? N N  Difficulty concentrating or making decisions? N N  Walking or climbing stairs? N Y  Dressing or bathing? N N  Doing errands, shopping? N N  Preparing Food and eating ? N -  Using the Toilet? N -  In the past six months, have you accidently leaked urine? N -  Do you have problems with loss of bowel  control? N -  Managing your Medications? N -  Managing your Finances? N -  Housekeeping or managing your Housekeeping? N -  Some recent data might be hidden    Patient Care Team: [name], MD as PCP - General (Family Medicine) Birder Robson, MD as Referring Physician (Ophthalmology)    Assessment:   This is a routine wellness examination for Yesica.  Exercise Activities and Dietary recommendations Current Exercise Habits: Home exercise routine, Type of exercise:  walking, Time (Minutes): 30, Frequency (Times/Week): 3, Weekly Exercise (Minutes/Week): 90, Intensity: Moderate, Exercise limited by: None identified  Goals    . Increase physical activity     Starting 08/29/2018, I will attempt to walk at least 30 minutes 3 days per week.        Fall Risk Fall Risk  08/29/2018 08/21/2017 08/09/2016 06/19/2015 06/19/2015  Falls in the past year? 0 No No No No   Depression Screen PHQ 2/9 Scores 08/29/2018 01/29/2018 08/21/2017 01/26/2017  PHQ - 2 Score 0 0 0 0  PHQ- 9 Score 0 - - -     Cognitive Function MMSE - Mini Mental State Exam 08/29/2018 08/09/2016  Orientation to time 5 5  Orientation to Place 5 5  Registration 3 3  Attention/ Calculation 0 0  Recall 3 3  Language- name 2 objects 0 0  Language- repeat 1 1  Language- follow 3 step command 3 3  Language- read & follow direction 0 0  Write a sentence 0 0  Copy design 0 0  Total score 20 20     PLEASE NOTE: A Mini-Cog screen was completed. Maximum score is 20. A value of 0 denotes this part of [place] MMSE was not completed or the patient failed this part of the Mini-Cog screening.   Mini-Cog Screening Orientation to Time - Max 5 pts Orientation to Place - Max 5 pts Registration - Max 3 pts Recall - Max 3 pts Language Repeat - Max 1 pts Language Follow 3 Step Command - Max 3 pts     Immunization History  Administered Date(s) Administered  . Pneumococcal Conjugate-13 09/23/2014  . Pneumococcal Polysaccharide-23 01/05/2010, 07/17/2013  . Tdap 10/07/2011    Screening Tests Health Maintenance  Topic Date Due  . FOOT EXAM  09/04/2018 (Originally 08/15/2017)  . OPHTHALMOLOGY EXAM  11/22/2018  . HEMOGLOBIN A1C  12/10/2018  . MAMMOGRAM  01/18/2019  . DTaP/Tdap/Td (2 - Td) 10/06/2021  . TETANUS/TDAP  10/06/2021  . DEXA SCAN  Completed  . PNA vac Low Risk Adult  Completed      Plan:     I have personally reviewed, addressed, and noted the following in the patient's chart:   A. Medical and social history B. Use of alcohol, tobacco or illicit drugs  C. Current medications and supplements D. Functional ability and status E.  Nutritional status F.  Physical activity G. Advance directives H. List of other physicians I.  Hospitalizations, surgeries, and ER visits in previous 12 months J.  [place] to include hearing, vision, cognitive, depression L. Referrals and appointments - none  In addition, I have reviewed and discussed with patient certain preventive protocols, quality metrics, and best practice recommendations. A written personalized care plan for preventive services as well as general preventive health recommendations were provided to patient.  See attached scanned questionnaire for additional information.   Signed,   Lindell Noe, MHA, BS, LPN Health Coach

## 2018-08-29 NOTE — Patient Instructions (Signed)
Cassandra Benson , Thank you for taking time to come for your Medicare Wellness Visit. I appreciate your ongoing commitment to your health goals. Please review the following plan we discussed and let me know if I can assist you in the future.   These are the goals we discussed: Goals    . Increase physical activity     Starting 08/29/2018, I will attempt to walk at least 30 minutes 3 days per week.        This is a list of the screening recommended for you and due dates:  Health Maintenance  Topic Date Due  . Complete foot exam   09/04/2018*  . Eye exam for diabetics  11/22/2018  . Hemoglobin A1C  12/10/2018  . Mammogram  01/18/2019  . DTaP/Tdap/Td vaccine (2 - Td) 10/06/2021  . Tetanus Vaccine  10/06/2021  . DEXA scan (bone density measurement)  Completed  . Pneumonia vaccines  Completed  *Topic was postponed. The date shown is not the original due date.   Preventive Care for Adults  A healthy lifestyle and preventive care can promote health and wellness. Preventive health guidelines for adults include the following key practices.  . A routine yearly physical is a good way to check with your health care provider about your health and preventive screening. It is a chance to share any concerns and updates on your health and to receive a thorough exam.  . Visit your dentist for a routine exam and preventive care every 6 months. Brush your teeth twice a day and floss once a day. Good oral hygiene prevents tooth decay and gum disease.  . The frequency of eye exams is based on your age, health, family medical history, use  of contact lenses, and other factors. Follow your health care provider's recommendations for frequency of eye exams.  . Eat a healthy diet. Foods like vegetables, fruits, whole grains, low-fat dairy products, and lean protein foods contain the nutrients you need without too many calories. Decrease your intake of foods high in solid fats, added sugars, and salt. Eat the  right amount of calories for you. Get information about a proper diet from your health care provider, if necessary.  . Regular physical exercise is one of the most important things you can do for your health. Most adults should get at least 150 minutes of moderate-intensity exercise (any activity that increases your heart rate and causes you to sweat) each week. In addition, most adults need muscle-strengthening exercises on 2 or more days a week.  Silver Sneakers may be a benefit available to you. To determine eligibility, you may visit the website: www.silversneakers.com or contact program at (660)118-4617 Mon-Fri between 8AM-8PM.   . Maintain a healthy weight. The body mass index (BMI) is a screening tool to identify possible weight problems. It provides an estimate of body fat based on height and weight. Your health care provider can find your BMI and can help you achieve or maintain a healthy weight.   For adults 20 years and older: ? A BMI below 18.5 is considered underweight. ? A BMI of 18.5 to 24.9 is normal. ? A BMI of 25 to 29.9 is considered overweight. ? A BMI of 30 and above is considered obese.   . Maintain normal blood lipids and cholesterol levels by exercising and minimizing your intake of saturated fat. Eat a balanced diet with plenty of fruit and vegetables. Blood tests for lipids and cholesterol should begin at age 39 and be repeated  every 5 years. If your lipid or cholesterol levels are high, you are over 50, or you are at high risk for heart disease, you may need your cholesterol levels checked more frequently. Ongoing high lipid and cholesterol levels should be treated with medicines if diet and exercise are not working.  . If you smoke, find out from your health care provider how to quit. If you do not use tobacco, please do not start.  . If you choose to drink alcohol, please do not consume more than 2 drinks per day. One drink is considered to be 12 ounces (355 mL) of  beer, 5 ounces (148 mL) of wine, or 1.5 ounces (44 mL) of liquor.  . If you are 4-41 years old, ask your health care provider if you should take aspirin to prevent strokes.  . Use sunscreen. Apply sunscreen liberally and repeatedly throughout the day. You should seek shade when your shadow is shorter than you. Protect yourself by wearing long sleeves, pants, a wide-brimmed hat, and sunglasses year round, whenever you are outdoors.  . Once a month, do a whole body skin exam, using a mirror to look at the skin on your back. Tell your health care provider of new moles, moles that have irregular borders, moles that are larger than a pencil eraser, or moles that have changed in shape or color.

## 2018-08-29 NOTE — Progress Notes (Signed)
PCP notes:   Health maintenance:  No gaps identified.   Abnormal screenings:   Hearing - failed  Hearing Screening   125Hz  250Hz  500Hz  1000Hz  2000Hz  3000Hz  4000Hz  6000Hz  8000Hz   Right ear:   40 40 40  0    Left ear:   40 40 40  0     Patient concerns:   Patient reports there is an small circular area on her right lower extremity that periodically changes in color and has intermittent sensation of pain. Patient has not seen dermatology for consult at this time.   Nurse concerns:  None  Next PCP appt:   09/04/18 @ 0930

## 2018-09-03 ENCOUNTER — Encounter: Payer: Self-pay | Admitting: Family Medicine

## 2018-09-03 NOTE — Assessment & Plan Note (Signed)
Preventative protocols reviewed and updated unless pt declined. Discussed healthy diet and lifestyle.  

## 2018-09-03 NOTE — Progress Notes (Signed)
BP 120/78 (BP Location: Left Arm, Patient Position: Sitting, Cuff Size: Normal)   Pulse 92   Temp 97.8 F (36.6 C) (Oral)   Ht 5' 3.75" (1.619 m)   Wt 151 lb 8 oz (68.7 kg)   SpO2 99%   BMI 26.21 kg/m    CC: CPE Subjective:    Patient ID: Cassandra Benson, female    DOB: 11-12-1940, 77 y.o.   MRN: [id number]  HPI: Cassandra Benson is a 77 y.o. female presenting on 09/04/2018 for Annual Exam (Pt 2. )   Saw Katha Cabal last week for medicare wellness visit. Note reviewed.   Sister going through breast cancer treatment (radiation).  On plavix for total 9 months after carotid stent placement (L carotid stent with angioplasty by Dr Carlis Abbott).  Asks about referral to [facility] as closer to home  Preventative: Colon cancer screening - normal 09/2012 per prior records, good for 10 yrs [facility])  Breast cancer screening - mammo/USWNL 12/2017. Does self breast exams at home. Well woman exam - last 2013 s/p abd hysterectomy, ovaries remain.  DEXA scan - normal per patient 04/2012.  Flu shot - ALLERGIC Tdap 07/2012.  Pneumovax 2014, prevnar 2015.  Shingrix - declines. Has had shingles.  Advanced directive: brings forms which were scanned (08/2017) - HCPOA are son Larrie Lucia and [name]. Does not want prolonged life support if terminal condition. HCPOA has authority to override directives.  Seat belt use discussed.  Sunscreen use discussed, wants spot on leg checked  Non smoker Alcohol - none Dentist q6 mo Eye exam yearly   Lives in [place] now. Recently moved from [place].Husbanddeceased mid 2017.No pets. 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 Diet - good water, fruits/vegetables daily, limited meat, protein drink every morning  Relevant past medical, surgical, family and social history reviewed and  updated as indicated. Interim medical history since our last visit reviewed. Allergies and medications reviewed and updated. Outpatient Medications Prior to Visit  Medication Sig Dispense Refill  . aspirin EC 81 MG tablet Take 81 mg by mouth daily.    . BD ULTRA-FINE LANCETS lancets Use as instructed upto 6 times daily 600 each 2  . benzocaine (ORAJEL) 10 % mucosal gel Use as directed 1 application in the mouth or throat 4 (four) times daily as needed for mouth pain.    Marland Kitchen clopidogrel (PLAVIX) 75 MG tablet Take 1 tablet (75 mg total) by mouth daily. 30 tablet 6  . diclofenac sodium (VOLTAREN) 1 % GEL Apply 1 application topically 3 (three) times daily. 1 Tube 1  . glucose blood (CONTOUR NEXT TEST) test strip Use as instructed to check 4 times daily 400 each 5  . insulin aspart (NOVOLOG) 100 UNIT/ML injection Use in an insulin pump upto 50 units per day (Patient taking differently: See admin instructions. Use in an insulin pump upto 50 units per day) 2 vial 6  . Krill Oil 500 MG CAPS Take 2 capsules (1,000 mg total) by mouth daily. (Patient taking differently: Take 500 mg by mouth daily. )    . lisinopril (PRINIVIL,ZESTRIL) 10 MG tablet Take 10 mg by mouth daily.    Marland Kitchen neomycin-polymyxin-dexamethasone (MAXITROL) 0.1 % ophthalmic suspension Place 1 drop into both eyes daily.    . nitroGLYCERIN (NITROSTAT) 0.4 MG SL tablet Place 1 tablet (0.4 mg  total) under the tongue every 5 (five) minutes as needed for chest pain. 30 tablet 1  . pantoprazole (PROTONIX) 40 MG tablet Take 1 tablet (40 mg total) by mouth daily. (Patient taking differently: Take 40 mg by mouth daily. Takes prn heart burn) 30 tablet 3  . rosuvastatin (CRESTOR) 10 MG tablet Take 1 tablet (10 mg total) by mouth every other day. (Patient taking differently: Take 10 mg by mouth daily. ) 30 tablet 6  . SYNTHROID 75 MCG tablet TAKE 1 TABLET BY MOUTH ONCE DAILY FOR 6 DAYS AND 1 AND 1/2 TABLETS ON THE 7TH DAY (Patient taking differently: Take  37.5-75 mcg by mouth See admin instructions. Take 37.5 mcg by mouth daily on Sunday. Take 75 mcg by mouth daily on all other days.) 90 tablet 3  . traMADol (ULTRAM) 50 MG tablet Take 0.5-1 tablets (25-50 mg total) by mouth 2 (two) times daily as needed for moderate pain. 8 tablet 0  . clopidogrel (PLAVIX) 75 MG tablet Take 1 tablet (75 mg total) by mouth daily. 30 tablet 3  . naproxen sodium (ALEVE) 220 MG tablet Take 220 mg by mouth 2 (two) times daily as needed (for osteoarthritis).      No facility-administered medications prior to visit.      Per HPI unless specifically indicated in ROS section below Review of Systems  Constitutional: Negative for activity change, appetite change, chills, fatigue, fever and unexpected weight change.  HENT: Negative for hearing loss.   Eyes: Positive for visual disturbance (slight).  Respiratory: Positive for cough. Negative for chest tightness, shortness of breath and wheezing.   Cardiovascular: Positive for chest pain (fleeting pains), palpitations and leg swelling.  Gastrointestinal: Negative for abdominal distention, abdominal pain, blood in stool, constipation, diarrhea, nausea and vomiting.  Genitourinary: Negative for difficulty urinating and hematuria.  Musculoskeletal: Positive for myalgias. Negative for arthralgias and neck pain.  Skin: Negative for rash.  Neurological: Negative for dizziness, seizures, syncope and headaches.  Hematological: Negative for adenopathy. Does not bruise/bleed easily.  Psychiatric/Behavioral: Negative for dysphoric mood. The patient is not nervous/anxious.        Objective:    BP 120/78 (BP Location: Left Arm, Patient Position: Sitting, Cuff Size: Normal)   Pulse 92   Temp 97.8 F (36.6 C) (Oral)   Ht 5' 3.75" (1.619 m)   Wt 151 lb 8 oz (68.7 kg)   SpO2 99%   BMI 26.21 kg/m   Wt Readings from Last 3 Encounters:  09/04/18 151 lb 8 oz (68.7 kg)  08/29/18 151 lb 4 oz (68.6 kg)  08/28/18 152 lb 14.4 oz (69.4  kg)    Physical Exam  Constitutional: She is oriented to person, place, and time. She appears well-developed and well-nourished. No distress.  Diffusely tender to light touch  HENT:  Head: Normocephalic and atraumatic.  Right Ear: Hearing, tympanic membrane, external ear and ear canal normal.  Left Ear: Hearing, tympanic membrane, external ear and ear canal normal.  Nose: Nose normal.  Mouth/Throat: Uvula is midline, oropharynx is clear and moist and mucous membranes are normal. No oropharyngeal exudate, posterior oropharyngeal edema or posterior oropharyngeal erythema.  Eyes: Pupils are equal, round, and reactive to light. Conjunctivae and EOM are normal. No scleral icterus.  Neck: Normal range of motion. Neck supple. Carotid bruit is not present. No thyromegaly present.  Cardiovascular: Normal rate, regular rhythm, normal heart sounds and intact distal pulses.  No murmur heard. Pulses:      Radial pulses are 2+ on  the right side, and 2+ on the left side.  Pulmonary/Chest: Effort normal and breath sounds normal. No respiratory distress. She has no wheezes. She has no rales.  Abdominal: Soft. Bowel sounds are normal. She exhibits no distension and no mass. There is no tenderness. There is no rebound and no guarding.  Musculoskeletal: Normal range of motion. She exhibits no edema.  Lymphadenopathy:    She has no cervical adenopathy.  Neurological: She is alert and oriented to person, place, and time.  CN grossly intact, station and gait intact  Skin: Skin is warm and dry. No rash noted.  Psychiatric: She has a normal mood and affect. Her behavior is normal. Judgment and thought content normal.  Nursing note and vitals reviewed.  Results for orders placed or performed in visit on 08/29/18  CBC with Differential/Platelet  Result Value Ref Range   WBC 6.6 4.0 - 10.5 K/uL   RBC 5.03 3.87 - 5.11 Mil/uL   Hemoglobin 13.3 12.0 - 15.0 g/dL   HCT 40.5 36.0 - 46.0 %   MCV 80.5 78.0 - 100.0 fl    MCHC 32.9 30.0 - 36.0 g/dL   RDW 14.6 11.5 - 15.5 %   Platelets 208.0 150.0 - 400.0 K/uL   Neutrophils Relative % 59.9 43.0 - 77.0 %   Lymphocytes Relative 28.5 12.0 - 46.0 %   Monocytes Relative 7.5 3.0 - 12.0 %   Eosinophils Relative 2.8 0.0 - 5.0 %   Basophils Relative 1.3 0.0 - 3.0 %   Neutro Abs 4.0 1.4 - 7.7 K/uL   Lymphs Abs 1.9 0.7 - 4.0 K/uL   Monocytes Absolute 0.5 0.1 - 1.0 K/uL   Eosinophils Absolute 0.2 0.0 - 0.7 K/uL   Basophils Absolute 0.1 0.0 - 0.1 K/uL  VITAMIN D 25 Hydroxy (Vit-D Deficiency, Fractures)  Result Value Ref Range   VITD 36.68 30.00 - 100.00 ng/mL  T4, free  Result Value Ref Range   Free T4 0.76 0.60 - 1.60 ng/dL  TSH  Result Value Ref Range   TSH 0.09 (L) 0.35 - 4.50 uIU/mL  Comprehensive metabolic panel  Result Value Ref Range   Sodium 144 135 - 145 mEq/L   Potassium 4.3 3.5 - 5.1 mEq/L   Chloride 109 96 - 112 mEq/L   CO2 28 19 - 32 mEq/L   Glucose, Bld 133 (H) 70 - 99 mg/dL   BUN 9 6 - 23 mg/dL   Creatinine, Ser 0.85 0.40 - 1.20 mg/dL   Total Bilirubin 0.3 0.2 - 1.2 mg/dL   Alkaline Phosphatase 71 39 - 117 U/L   AST 12 0 - 37 U/L   ALT 12 0 - 35 U/L   Total Protein 6.6 6.0 - 8.3 g/dL   Albumin 4.0 3.5 - 5.2 g/dL   Calcium 9.4 8.4 - 10.5 mg/dL   GFR 69.08 >60.00 mL/min  Lipid panel  Result Value Ref Range   Cholesterol 155 0 - 200 mg/dL   Triglycerides 115.0 0.0 - 149.0 mg/dL   HDL 43.30 >39.00 mg/dL   VLDL 23.0 0.0 - 40.0 mg/dL   LDL Cholesterol 88 0 - 99 mg/dL   Total CHOL/HDL Ratio 4    NonHDL 111.46       Assessment & Plan:   Problem List Items Addressed This Visit    SLE (systemic lupus erythematosus) (HCC)    ?dx - s/p negative confirmatory testing by Dr Trudie Reed 2019.       Raynaud disease   LADA (latent autoimmune diabetes  in adults), managed as type 1 ([place])    Continue endo f/u.      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 TSH returned low, but thyroxine level was normal  - will defer to endo (appt scheduled early next year). Pt denies thyroid symptoms.      Hyperlipidemia    Marked improvement with addition of crestor QOD dosing - will continue this. The 10-year ASCVD risk score [name]., et al., 2013) is: 35.4%   Values used to calculate the score:     Age: 77 years     Sex: Female     Is Non-Hispanic African American: No     Diabetic: Yes     Tobacco smoker: No     Systolic Blood Pressure: 093 mmHg     Is BP treated: Yes     HDL Cholesterol: 43.3 mg/dL     Total Cholesterol: 155 mg/dL        Health maintenance examination - Primary    Preventative protocols reviewed and updated unless pt declined. Discussed healthy diet and lifestyle.       Fibromyalgia   Essential hypertension    Chronic, stable. Continue current regimen.       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       Advanced care planning/counseling discussion    Advanced directive: brings forms which were scanned (08/2017) - HCPOA are son Yanel Dombrosky and [name]. Does not want prolonged life support if terminal condition. HCPOA has authority to override directives.           No orders of the defined types were placed in this encounter.  No orders of the defined types were placed in this encounter.   Follow up plan: Return in about 4 months (around 01/04/2019) for follow up visit.  [name], MD

## 2018-09-04 ENCOUNTER — Ambulatory Visit (INDEPENDENT_AMBULATORY_CARE_PROVIDER_SITE_OTHER): Payer: PPO | Admitting: Family Medicine

## 2018-09-04 ENCOUNTER — Encounter: Payer: Self-pay | Admitting: Family Medicine

## 2018-09-04 VITALS — BP 120/78 | HR 92 | Temp 97.8°F | Ht 63.75 in | Wt 151.5 lb

## 2018-09-04 DIAGNOSIS — E063 Autoimmune thyroiditis: Secondary | ICD-10-CM

## 2018-09-04 DIAGNOSIS — M329 Systemic lupus erythematosus, unspecified: Secondary | ICD-10-CM

## 2018-09-04 DIAGNOSIS — I10 Essential (primary) hypertension: Secondary | ICD-10-CM

## 2018-09-04 DIAGNOSIS — E139 Other specified diabetes mellitus without complications: Secondary | ICD-10-CM

## 2018-09-04 DIAGNOSIS — Z0000 Encounter for general adult medical examination without abnormal findings: Secondary | ICD-10-CM | POA: Diagnosis not present

## 2018-09-04 DIAGNOSIS — Z7189 Other specified counseling: Secondary | ICD-10-CM | POA: Diagnosis not present

## 2018-09-04 DIAGNOSIS — I7300 Raynaud's syndrome without gangrene: Secondary | ICD-10-CM | POA: Diagnosis not present

## 2018-09-04 DIAGNOSIS — M797 Fibromyalgia: Secondary | ICD-10-CM | POA: Diagnosis not present

## 2018-09-04 DIAGNOSIS — E782 Mixed hyperlipidemia: Secondary | ICD-10-CM

## 2018-09-04 DIAGNOSIS — I6522 Occlusion and stenosis of left carotid artery: Secondary | ICD-10-CM | POA: Diagnosis not present

## 2018-09-04 DIAGNOSIS — E038 Other specified hypothyroidism: Secondary | ICD-10-CM

## 2018-09-04 NOTE — Assessment & Plan Note (Signed)
Continue endo f/u.  

## 2018-09-04 NOTE — Assessment & Plan Note (Addendum)
Appreciate endo care. TSH returned low, but thyroxine level was normal - will defer to endo (appt scheduled early next year). Pt denies thyroid symptoms.

## 2018-09-04 NOTE — Assessment & Plan Note (Deleted)
Advanced directive: brings forms which were scanned (08/2017) - HCPOA are son Cassandra Benson and Cassandra Benson. Does not want prolonged life support if terminal condition. HCPOA has authority to override directives.

## 2018-09-04 NOTE — Assessment & Plan Note (Signed)
Marked improvement with addition of crestor QOD dosing - will continue this. The 10-year ASCVD risk score [name]., et al., 2013) is: 35.4%   Values used to calculate the score:     Age: 77 years     Sex: Female     Is Non-Hispanic African American: No     Diabetic: Yes     Tobacco smoker: No     Systolic Blood Pressure: 024 mmHg     Is BP treated: Yes     HDL Cholesterol: 43.3 mg/dL     Total Cholesterol: 155 mg/dL

## 2018-09-04 NOTE — Assessment & Plan Note (Signed)
?  dx - s/p negative confirmatory testing by Dr Trudie Reed 2019.

## 2018-09-04 NOTE — Assessment & Plan Note (Signed)
Appreciate [facility] care.  

## 2018-09-04 NOTE — Patient Instructions (Addendum)
If interested, check with pharmacy about new 2 shot shingles series (shingrix).  You are doing well today! Blood work is looking better.  Continue current medicines Return as needed or in 3-4 months for follow up visit.  We will refer you to [facility] clinic rheumatology for distance.  Health Maintenance, Female Adopting a healthy lifestyle and getting preventive care can go a long way to promote health and wellness. Talk with your health care provider about what schedule of regular examinations is right for you. This is a good chance for you to check in with your provider about disease prevention and staying healthy. In between checkups, there are plenty of things you can do on your own. Experts have done a lot of research about which lifestyle changes and preventive measures are most likely to keep you healthy. Ask your health care provider for more information. Weight and diet Eat a healthy diet  Be sure to include plenty of vegetables, fruits, low-fat dairy products, and lean protein.  Do not eat a lot of foods high in solid fats, added sugars, or salt.  Get regular exercise. This is one of the most important things you can do for your health. ? Most adults should exercise for at least 150 minutes each week. The exercise should increase your heart rate and make you sweat (moderate-intensity exercise). ? Most adults should also do strengthening exercises at least twice a week. This is in addition to the moderate-intensity exercise.  Maintain a healthy weight  Body mass index (BMI) is a measurement that can be used to identify possible weight problems. It estimates body fat based on height and weight. Your health care provider can help determine your BMI and help you achieve or maintain a healthy weight.  For females 45 years of age and older: ? A BMI below 18.5 is considered underweight. ? A BMI of 18.5 to 24.9 is normal. ? A BMI of 25 to 29.9 is considered overweight. ? A BMI of 30 and  above is considered obese.  Watch levels of cholesterol and blood lipids  You should start having your blood tested for lipids and cholesterol at 76 years of age, then have this test every 5 years.  You may need to have your cholesterol levels checked more often if: ? Your lipid or cholesterol levels are high. ? You are older than 76 years of age. ? You are at high risk for heart disease.  Cancer screening Lung Cancer  Lung cancer screening is recommended for adults 56-3 years old who are at high risk for lung cancer because of a history of smoking.  A yearly low-dose CT scan of the lungs is recommended for people who: ? Currently smoke. ? Have quit within the past 15 years. ? Have at least a 30-pack-year history of smoking. A pack year is smoking an average of one pack of cigarettes a day for 1 year.  Yearly screening should continue until it has been 15 years since you quit.  Yearly screening should stop if you develop a health problem that would prevent you from having lung cancer treatment.  Breast Cancer  Practice breast self-awareness. This means understanding how your breasts normally appear and feel.  It also means doing regular breast self-exams. Let your health care provider know about any changes, no matter how small.  If you are in your 20s or 30s, you should have a clinical breast exam (CBE) by a health care provider every 1-3 years as part of a  regular health exam.  If you are 40 or older, have a CBE every year. Also consider having a breast X-ray (mammogram) every year.  If you have a family history of breast cancer, talk to your health care provider about genetic screening.  If you are at high risk for breast cancer, talk to your health care provider about having an MRI and a mammogram every year.  Breast cancer gene (BRCA) assessment is recommended for women who have family members with BRCA-related cancers. BRCA-related cancers  include: ? Breast. ? Ovarian. ? Tubal. ? Peritoneal cancers.  Results of the assessment will determine the need for genetic counseling and BRCA1 and BRCA2 testing.  Cervical Cancer Your health care provider may recommend that you be screened regularly for cancer of the pelvic organs (ovaries, uterus, and vagina). This screening involves a pelvic examination, including checking for microscopic changes to the surface of your cervix (Pap test). You may be encouraged to have this screening done every 3 years, beginning at age 74.  For women ages 34-65, health care providers may recommend pelvic exams and Pap testing every 3 years, or they may recommend the Pap and pelvic exam, combined with testing for human papilloma virus (HPV), every 5 years. Some types of HPV increase your risk of cervical cancer. Testing for HPV may also be done on women of any age with unclear Pap test results.  Other health care providers may not recommend any screening for nonpregnant women who are considered low risk for pelvic cancer and who do not have symptoms. Ask your health care provider if a screening pelvic exam is right for you.  If you have had past treatment for cervical cancer or a condition that could lead to cancer, you need Pap tests and screening for cancer for at least 20 years after your treatment. If Pap tests have been discontinued, your risk factors (such as having a new sexual partner) need to be reassessed to determine if screening should resume. Some women have medical problems that increase the chance of getting cervical cancer. In these cases, your health care provider may recommend more frequent screening and Pap tests.  Colorectal Cancer  This type of cancer can be detected and often prevented.  Routine colorectal cancer screening usually begins at 76 years of age and continues through 76 years of age.  Your health care provider may recommend screening at an earlier age if you have risk factors  for colon cancer.  Your health care provider may also recommend using home test kits to check for hidden blood in the stool.  A small camera at the end of a tube can be used to examine your colon directly (sigmoidoscopy or colonoscopy). This is done to check for the earliest forms of colorectal cancer.  Routine screening usually begins at age 70.  Direct examination of the colon should be repeated every 5-10 years through 76 years of age. However, you may need to be screened more often if early forms of precancerous polyps or small growths are found.  Skin Cancer  Check your skin from head to toe regularly.  Tell your health care provider about any new moles or changes in moles, especially if there is a change in a mole's shape or color.  Also tell your health care provider if you have a mole that is larger than the size of a pencil eraser.  Always use sunscreen. Apply sunscreen liberally and repeatedly throughout the day.  Protect yourself by wearing long sleeves, pants,  a wide-brimmed hat, and sunglasses whenever you are outside.  Heart disease, diabetes, and high blood pressure  High blood pressure causes heart disease and increases the risk of stroke. High blood pressure is more likely to develop in: ? People who have blood pressure in the high end of the normal range (130-139/85-89 mm Hg). ? People who are overweight or obese. ? People who are African American.  If you are 82-70 years of age, have your blood pressure checked every 3-5 years. If you are 56 years of age or older, have your blood pressure checked every year. You should have your blood pressure measured twice-once when you are at a hospital or clinic, and once when you are not at a hospital or clinic. Record the average of the two measurements. To check your blood pressure when you are not at a hospital or clinic, you can use: ? An automated blood pressure machine at a pharmacy. ? A home blood pressure monitor.  If  you are between 62 years and 4 years old, ask your health care provider if you should take aspirin to prevent strokes.  Have regular diabetes screenings. This involves taking a blood sample to check your fasting blood sugar level. ? If you are at a normal weight and have a low risk for diabetes, have this test once every three years after 76 years of age. ? If you are overweight and have a high risk for diabetes, consider being tested at a younger age or more often. Preventing infection Hepatitis B  If you have a higher risk for hepatitis B, you should be screened for this virus. You are considered at high risk for hepatitis B if: ? You were born in a country where hepatitis B is common. Ask your health care provider which countries are considered high risk. ? Your parents were born in a high-risk country, and you have not been immunized against hepatitis B (hepatitis B vaccine). ? You have HIV or AIDS. ? You use needles to inject street drugs. ? You live with someone who has hepatitis B. ? You have had sex with someone who has hepatitis B. ? You get hemodialysis treatment. ? You take certain medicines for conditions, including cancer, organ transplantation, and autoimmune conditions.  Hepatitis C  Blood testing is recommended for: ? Everyone born from [birth year] through 1964. ? Anyone with known risk factors for hepatitis C.  Sexually transmitted infections (STIs)  You should be screened for sexually transmitted infections (STIs) including gonorrhea and chlamydia if: ? You are sexually active and are younger than 76 years of age. ? You are older than 76 years of age and your health care provider tells you that you are at risk for this type of infection. ? Your sexual activity has changed since you were last screened and you are at an increased risk for chlamydia or gonorrhea. Ask your health care provider if you are at risk.  If you do not have HIV, but are at risk, it may be recommended  that you take a prescription medicine daily to prevent HIV infection. This is called pre-exposure prophylaxis (PrEP). You are considered at risk if: ? You are sexually active and do not regularly use condoms or know the HIV status of your partner(s). ? You take drugs by injection. ? You are sexually active with a partner who has HIV.  Talk with your health care provider about whether you are at high risk of being infected with HIV. If you choose  to begin PrEP, you should first be tested for HIV. You should then be tested every 3 months for as long as you are taking PrEP. Pregnancy  If you are premenopausal and you may become pregnant, ask your health care provider about preconception counseling.  If you may become pregnant, take 400 to 800 micrograms (mcg) of folic acid every day.  If you want to prevent pregnancy, talk to your health care provider about birth control (contraception). Osteoporosis and menopause  Osteoporosis is a disease in which the bones lose minerals and strength with aging. This can result in serious bone fractures. Your risk for osteoporosis can be identified using a bone density scan.  If you are 26 years of age or older, or if you are at risk for osteoporosis and fractures, ask your health care provider if you should be screened.  Ask your health care provider whether you should take a calcium or vitamin D supplement to lower your risk for osteoporosis.  Menopause may have certain physical symptoms and risks.  Hormone replacement therapy may reduce some of these symptoms and risks. Talk to your health care provider about whether hormone replacement therapy is right for you. Follow these instructions at home:  Schedule regular health, dental, and eye exams.  Stay current with your immunizations.  Do not use any tobacco products including cigarettes, chewing tobacco, or electronic cigarettes.  If you are pregnant, do not drink alcohol.  If you are  breastfeeding, limit how much and how often you drink alcohol.  Limit alcohol intake to no more than 1 drink per day for nonpregnant women. One drink equals 12 ounces of beer, 5 ounces of wine, or 1 ounces of hard liquor.  Do not use street drugs.  Do not share needles.  Ask your health care provider for help if you need support or information about quitting drugs.  Tell your health care provider if you often feel depressed.  Tell your health care provider if you have ever been abused or do not feel safe at home. This information is not intended to replace advice given to you by your health care provider. Make sure you discuss any questions you have with your health care provider. Document Released: 03/28/2011 Document Revised: 02/18/2016 Document Reviewed: 06/16/2015 Elsevier Interactive Patient Education  [employer].

## 2018-09-04 NOTE — Assessment & Plan Note (Addendum)
Advanced directive: brings forms which were scanned (08/2017) - HCPOA are son Laylani Pudwill and [name]. Does not want prolonged life support if terminal condition. HCPOA has authority to override directives.

## 2018-09-04 NOTE — Assessment & Plan Note (Signed)
Chronic, stable. Continue current regimen. 

## 2018-09-26 DIAGNOSIS — E109 Type 1 diabetes mellitus without complications: Secondary | ICD-10-CM | POA: Diagnosis not present

## 2018-10-03 NOTE — Progress Notes (Signed)
Cardiology Office Note  Date:  10/04/2018   ID:  Cassandra Benson, DOB 1941/08/01, MRN [id number]  PCP:  [name], MD   Chief Complaint  Patient presents with  . other    12 mo follow up.Medications reviewed verbally.     HPI:  Cassandra Benson is a very pleasant 78 year old woman with  15 years of diabetes, on insulin,  Hyperlipidemia, family history of coronary artery disease,  prior episodes of chest pain,  80% stenosis of the left internal carotid artery.  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 Also diagnosed with worsening carotid stenosis on the left Underwent carotid stenting Headaches better after carotid stent  Reports she is now tolerating crestor 10 daily Cholesterol has improved to 50 down to 150  Sugars have been running high She attributes this to general inflammation HBA1C 8.1  raynauds of feet  EKG personally reviewed by myself on todays visit Shows normal sinus rhythm rate 77 bpm no significant ST or T wave changes  Other past medical history reviewed  stressful 01/01/2016 husband died in 01-Jan-2016, cancer Was diagnosed late spring, died several months later from metastatic disease lonely at nighttime  Previous episodes of chest pain,  woke up with some chest discomfort.  Went to the emergency room, cardiac enzymes negative 3,  chest CT with no PE.   She does report prior cardiac catheterization in 01-01-1999 showing no significant disease CT scan done 04/2015 in the emergency room shows no coronary calcifications, minimal minimal descending aorta  atherosclerosis   PMH:   has a past medical history of Allergy, ANA positive, Arthritis, Carotid stenosis, asymptomatic (06/19/2015), Dermatomyositis ([place]), Diabetes mellitus without complication ([place]), Family history of adverse reaction to anesthesia, Fibromyalgia, GERD (gastroesophageal reflux disease), Glaucoma, History of blood clots, History of chicken pox, History of diverticulitis, History of pericarditis (12/31/1984), History of pneumonia (12/31/2012), History of shingles, History of UTI, Hyperlipidemia, Hypertension, Hypothyroidism, Lupus (systemic lupus erythematosus) ([place]), Mixed connective tissue disease ([place]), PONV (postoperative nausea and vomiting), Raynaud's disease without gangrene, Shoulder pain (left), Sjogren's syndrome ([place]), Sleep apnea, 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[place]), and Vitamin D deficiency.  PSH:    Past Surgical History:  Procedure Laterality Date  . ABDOMINAL HYSTERECTOMY  1978   fibroids and menorrhagia, ovaries remain  . ARTERY BIOPSY Right 04/06/2018   Procedure: BIOPSY TEMPORAL ARTERY RIGHT;  Surgeon: Beverly Gust, MD;  Location: [facility];  Service: ENT;  Laterality: Right;  Diabetic - insulin pump sleep apnea  . [facility] normal per patient  . COLONOSCOPY WITH ESOPHAGOGASTRODUODENOSCOPY (EGD)  03/2007   2 ulcers, benign polyp, rpt 5 yrs ([place], [place])  . PARTIAL HIP ARTHROPLASTY  January 01, 2012   Right hip replacement  . TONSILLECTOMY AND ADENOIDECTOMY    . TRANSCAROTID ARTERY REVASCULARIZATION Left 07/23/2018   Procedure: TRANSCAROTID ARTERY REVASCULARIZATION;  Surgeon: Marty Heck, MD;  Location: [facility];  Service: Vascular;  Laterality: Left;  . TUBAL LIGATION    . VAGINAL DELIVERY     x2, no complications    Current Outpatient Medications  Medication Sig Dispense Refill  . aspirin EC 81 MG tablet Take 81  mg by mouth daily.    . BD ULTRA-FINE LANCETS lancets Use as instructed upto 6 times daily 600 each 2  .  benzocaine (ORAJEL) 10 % mucosal gel Use as directed 1 application in the mouth or throat 4 (four) times daily as needed for mouth pain.    Marland Kitchen clopidogrel (PLAVIX) 75 MG tablet Take 1 tablet (75 mg total) by mouth daily. 30 tablet 6  . diclofenac sodium (VOLTAREN) 1 % GEL Apply 1 application topically 3 (three) times daily. 1 Tube 1  . glucose blood (CONTOUR NEXT TEST) test strip Use as instructed to check 4 times daily 400 each 5  . insulin aspart (NOVOLOG) 100 UNIT/ML injection Use in an insulin pump upto 50 units per day (Patient taking differently: See admin instructions. Use in an insulin pump upto 50 units per day) 2 vial 6  . Krill Oil 500 MG CAPS Take 2 capsules (1,000 mg total) by mouth daily. (Patient taking differently: Take 500 mg by mouth daily. )    . lisinopril (PRINIVIL,ZESTRIL) 10 MG tablet Take 10 mg by mouth daily.    . naproxen sodium (ALEVE) 220 MG tablet Take 220 mg by mouth 2 (two) times daily as needed (for osteoarthritis).     Marland Kitchen neomycin-polymyxin-dexamethasone (MAXITROL) 0.1 % ophthalmic suspension Place 1 drop into both eyes daily.    . nitroGLYCERIN (NITROSTAT) 0.4 MG SL tablet Place 1 tablet (0.4 mg total) under the tongue every 5 (five) minutes as needed for chest pain. 30 tablet 1  . pantoprazole (PROTONIX) 40 MG tablet Take 1 tablet (40 mg total) by mouth daily. (Patient taking differently: Take 40 mg by mouth daily. Takes prn heart burn) 30 tablet 3  . rosuvastatin (CRESTOR) 10 MG tablet Take 1 tablet (10 mg total) by mouth every other day. (Patient taking differently: Take 10 mg by mouth daily. ) 30 tablet 6  . SYNTHROID 75 MCG tablet TAKE 1 TABLET BY MOUTH ONCE DAILY FOR 6 DAYS AND 1 AND 1/2 TABLETS ON THE 7TH DAY (Patient taking differently: Take 37.5-75 mcg by mouth See admin instructions. Take 37.5 mcg by mouth daily on Sunday. Take 75 mcg by mouth daily on all other days.) 90 tablet 3  . traMADol (ULTRAM) 50 MG tablet Take 0.5-1 tablets (25-50 mg total) by mouth 2  (two) times daily as needed for moderate pain. 8 tablet 0   No current facility-administered medications for this visit.      Allergies:   Iodinated diagnostic agents; Penicillins; Anectine [succinylcholine]; Codeine; Gabapentin; Influenza vaccines; Nortriptyline; Pamelor [nortriptyline hcl]; Valsartan; Erythromycin; and Sulfa antibiotics   Social History:  The patient  reports that she has never smoked. She has never used smokeless tobacco. She reports that she does not drink alcohol or use drugs.   Family History:   family history includes Alcohol abuse in her brother; Breast cancer in her maternal aunt and maternal aunt; CAD (age of onset: 35) in her brother; CAD (age of onset: 21) in her brother; CAD (age of onset: 53) in her father and mother; CAD (age of onset: 37) in her sister; COPD in her brother and mother; Cancer in her maternal aunt; Depression in her grandchild; Diabetes in her brother and sister; [name] in her mother; Lupus in her mother; Rheum arthritis in her mother; Seizures in her son; Stroke in her brother and sister.    Review of Systems: Review of Systems  Constitutional: Positive for malaise/fatigue.  Respiratory: Negative.   Cardiovascular: Negative.  Gastrointestinal: Negative.   Musculoskeletal: Negative.   Neurological: Negative.   Psychiatric/Behavioral: Negative.   All other systems reviewed and are negative.    PHYSICAL EXAM: VS:  BP (!) 142/72 (BP Location: Left Arm, Patient Position: Sitting, Cuff Size: Normal)   Pulse 77   Ht 5\' 3"  (1.6 m)   Wt 153 lb (69.4 kg)   BMI 27.10 kg/m  , BMI Body mass index is 27.1 kg/m. Constitutional:  oriented to person, place, and time. No distress.  HENT:  Head: Grossly normal Eyes:  no discharge. No scleral icterus.  Neck: No JVD, no carotid bruits  Cardiovascular: Regular rate and rhythm, no murmurs appreciated Pulmonary/Chest: Clear to auscultation bilaterally, no wheezes or rails Abdominal: Soft.   no distension.  no tenderness.  Musculoskeletal: Normal range of motion Neurological:  normal muscle tone. Coordination normal. No atrophy Skin: Skin warm and dry Psychiatric: normal affect, pleasant   Recent Labs: 08/29/2018: ALT 12; BUN 9; Creatinine, Ser 0.85; Hemoglobin 13.3; Platelets 208.0; Potassium 4.3; Sodium 144; TSH 0.09    Lipid Panel Lab Results  Component Value Date   CHOL 155 08/29/2018   HDL 43.30 08/29/2018   LDLCALC 88 08/29/2018   TRIG 115.0 08/29/2018      Wt Readings from Last 3 Encounters:  10/04/18 153 lb (69.4 kg)  09/04/18 151 lb 8 oz (68.7 kg)  08/29/18 151 lb 4 oz (68.6 kg)       ASSESSMENT AND PLAN:  Essential hypertension - Plan: EKG 12-Lead Blood pressure stable, no medication changes made  LADA (latent autoimmune diabetes in adults), managed as type 1 ([place]) Stressed importance of low carbohydrate diet Exercise program.  Hemoglobin A1c trending upwards, discussed with her   bilateral carotid artery stenosis Tolerating Crestor 10 mg daily Recent stent on the left Offered Zetia but she does not want additional medications at this time  Adjustment disorder with mixed anxiety and depressed mood  loss of her husband,  Reports she is doing relatively well considering  Chest pain Prior episodes of atypical chest pain No further work-up at this time  Pure hypercholesterolemia Tolerating Crestor 10, we did offer Zetia which she has declined   Total encounter time more than 25 minutes  Greater than 50% was spent in counseling and coordination of care with the patient   Disposition:   F/U  12 months   Orders Placed This Encounter  Procedures  . EKG 12-Lead     Signed, Esmond Plants, M.D., Ph.D. 10/04/2018  [facility] [place], [place] [phone]

## 2018-10-04 ENCOUNTER — Encounter: Payer: Self-pay | Admitting: Cardiovascular Disease

## 2018-10-04 ENCOUNTER — Ambulatory Visit: Payer: PPO | Admitting: Cardiovascular Disease

## 2018-10-04 VITALS — BP 142/72 | HR 77 | Ht 63.0 in | Wt 153.0 lb

## 2018-10-04 DIAGNOSIS — E782 Mixed hyperlipidemia: Secondary | ICD-10-CM

## 2018-10-04 DIAGNOSIS — I10 Essential (primary) hypertension: Secondary | ICD-10-CM | POA: Diagnosis not present

## 2018-10-04 DIAGNOSIS — I739 Peripheral vascular disease, unspecified: Secondary | ICD-10-CM | POA: Insufficient documentation

## 2018-10-04 DIAGNOSIS — E1169 Type 2 diabetes mellitus with other specified complication: Secondary | ICD-10-CM | POA: Diagnosis not present

## 2018-10-04 DIAGNOSIS — Z794 Long term (current) use of insulin: Secondary | ICD-10-CM

## 2018-10-04 DIAGNOSIS — M329 Systemic lupus erythematosus, unspecified: Secondary | ICD-10-CM

## 2018-10-04 DIAGNOSIS — I6523 Occlusion and stenosis of bilateral carotid arteries: Secondary | ICD-10-CM | POA: Diagnosis not present

## 2018-10-04 NOTE — Patient Instructions (Addendum)
Medication Instructions:  No changes  Consider zetia 10 mg daily for cholesterol, with crestor Goal LDL <70 (your number in 88)  If you need a refill on your cardiac medications before your next appointment, please call your pharmacy.    Lab work: No new labs needed   If you have labs (blood work) drawn today and your tests are completely normal, you will receive your results only by: Marland Kitchen MyChart Message (if you have MyChart) OR . A paper copy in the mail If you have any lab test that is abnormal or we need to change your treatment, we will call you to review the results.   Testing/Procedures: No new testing needed   Follow-Up: At [facility], you and your health needs are our priority.  As part of our continuing mission to provide you with exceptional heart care, we have created designated Provider Care Teams.  These Care Teams include your primary Cardiologist (physician) and Advanced Practice Providers (APPs -  Physician Assistants and Nurse Practitioners) who all work together to provide you with the care you need, when you need it.  . You will need a follow up appointment in 12 months .   Please call our office 2 months in advance to schedule this appointment.    . Providers on your designated Care Team:   . Murray Hodgkins, NP . Christell Faith, PA-C . Marrianne Mood, PA-C  Any Other Special Instructions Will Be Listed Below (If Applicable).  For educational health videos Log in to : [email or web address] Or : [email or web address], password : [email or web address]

## 2018-10-07 ENCOUNTER — Other Ambulatory Visit: Payer: Self-pay

## 2018-10-07 ENCOUNTER — Emergency Department: Payer: PPO

## 2018-10-07 DIAGNOSIS — Z5321 Procedure and treatment not carried out due to patient leaving prior to being seen by health care provider: Secondary | ICD-10-CM | POA: Diagnosis not present

## 2018-10-07 DIAGNOSIS — R531 Weakness: Secondary | ICD-10-CM | POA: Diagnosis not present

## 2018-10-07 DIAGNOSIS — R509 Fever, unspecified: Secondary | ICD-10-CM | POA: Diagnosis not present

## 2018-10-07 DIAGNOSIS — R0600 Dyspnea, unspecified: Secondary | ICD-10-CM | POA: Insufficient documentation

## 2018-10-07 LAB — CBC
HCT: 41.6 % (ref 36.0–46.0)
HEMOGLOBIN: 13.1 g/dL (ref 12.0–15.0)
MCH: 26.1 pg (ref 26.0–34.0)
MCHC: 31.5 g/dL (ref 30.0–36.0)
MCV: 82.9 fL (ref 80.0–100.0)
Platelets: 185 10*3/uL (ref 150–400)
RBC: 5.02 MIL/uL (ref 3.87–5.11)
RDW: 14.2 % (ref 11.5–15.5)
WBC: 7.4 10*3/uL (ref 4.0–10.5)
nRBC: 0 % (ref 0.0–0.2)

## 2018-10-07 LAB — BASIC METABOLIC PANEL
Anion gap: 11 (ref 5–15)
BUN: 18 mg/dL (ref 8–23)
CO2: 22 mmol/L (ref 22–32)
Calcium: 9 mg/dL (ref 8.9–10.3)
Chloride: 101 mmol/L (ref 98–111)
Creatinine, Ser: 0.97 mg/dL (ref 0.44–1.00)
GFR calc Af Amer: >60 mL/min (ref >60)
GFR calc non Af Amer: 57 mL/min — ABNORMAL LOW (ref >60)
Glucose, Bld: 201 mg/dL — ABNORMAL HIGH (ref 70–99)
Potassium: 4.2 mmol/L (ref 3.5–5.1)
Sodium: 134 mmol/L — ABNORMAL LOW (ref 135–145)

## 2018-10-07 NOTE — ED Triage Notes (Signed)
Pt reports that she started feeling bad yesterday, states fever of 102 this am, reports hx of lupus and is feeling very tired and weak, states that she called the triage nurse for [facility] this am who told her to go to the er but pt states that she had to wait for her son and his family to bring her, no distress noted at this time

## 2018-10-08 ENCOUNTER — Telehealth: Payer: Self-pay

## 2018-10-08 ENCOUNTER — Ambulatory Visit (INDEPENDENT_AMBULATORY_CARE_PROVIDER_SITE_OTHER): Payer: PPO | Admitting: Family Medicine

## 2018-10-08 ENCOUNTER — Encounter: Payer: Self-pay | Admitting: Family Medicine

## 2018-10-08 ENCOUNTER — Emergency Department
Admission: EM | Admit: 2018-10-08 | Discharge: 2018-10-08 | Discharge status: Against Medical Advice | Payer: PPO | Attending: Emergency Medicine | Admitting: Emergency Medicine

## 2018-10-08 VITALS — BP 124/60 | HR 100 | Temp 99.8°F | Ht 63.75 in | Wt 151.0 lb

## 2018-10-08 DIAGNOSIS — J101 Influenza due to other identified influenza virus with other respiratory manifestations: Secondary | ICD-10-CM | POA: Diagnosis not present

## 2018-10-08 LAB — URINALYSIS, COMPLETE (UACMP) WITH MICROSCOPIC
Bacteria, UA: NONE SEEN
Bilirubin Urine: NEGATIVE
Glucose, UA: NEGATIVE mg/dL
Ketones, ur: 5 mg/dL — AB
Nitrite: NEGATIVE
Protein, ur: NEGATIVE mg/dL
Specific Gravity, Urine: 1.019 (ref 1.005–1.030)
pH: 5 (ref 5.0–8.0)

## 2018-10-08 LAB — INFLUENZA PANEL BY PCR (TYPE A & B)
Influenza A By PCR: NEGATIVE
Influenza B By PCR: POSITIVE — AB

## 2018-10-08 MED ORDER — BENZONATATE 100 MG PO CAPS: 100.0000 mg | ORAL_CAPSULE | Freq: Three times a day (TID) | ORAL | 0 refills | Status: DC | PRN

## 2018-10-08 NOTE — Telephone Encounter (Signed)
Per chart review tab pt was at [facility] ED;per ED note pt LWBS.    [facility] Patient Name: Cassandra Benson Gender: Female DOB: 08/31/1941 Age: 78 Y 3 M 11 D Return Phone Number: [phone] (Primary), [phone] (Secondary) [place] [address] Client [facility] - Client Client Site [facility] Physician [name] - MD Contact Type Call Who Is Calling Patient / Member / Family / Caregiver Call Type Triage / Clinical Relationship To Patient Self Return Phone Number [phone] (Primary) Chief Complaint Flu Symptom Reason for Call Symptomatic / Request for Health Information Initial Comment May have the flu since Thurday, and wants an appointment. Translation No Nurse Assessment Nurse: Reasor, RN, Leah Date/Time (Eastern Time): 10/07/2018 8:46:10 PM Confirm and document reason for call. If symptomatic, describe symptoms. ---Caller states she may have the flu since Thurday. Fever of 101.3 orally, nausea, body aches, sore throat, coughing. No diarrhea Does the patient have any new or worsening symptoms? ---Yes Will a triage be completed? ---Yes Related visit to physician within the last 2 weeks? ---No Does the PT have any chronic conditions? (i.e. diabetes, asthma, this includes High risk factors for pregnancy, etc.) ---Yes List chronic conditions. ---lupus Sjogren's Type 1 DM with insulin pump Is this a behavioral health or substance abuse call? ---No Guidelines Guideline Title Affirmed Question Affirmed Notes Nurse Date/Time (Eastern Time) Influenza - Seasonal [1] Difficulty breathing AND [2] not severe AND [3] not from stuffy nose (e.g., not relieved by cleaning out the nose) Reasor, RN, Denny Peon 10/07/2018 8:50:38 PM Disp. Time Eilene Ghazi Time) Disposition Final User 10/07/2018 8:56:16 PM Go  to ED Now (or PCP triage) Yes Reasor, RN, Carroll Kinds Disagree/Comply Comply PLEASE NOTE: All timestamps contained within this report are represented as [place] Standard Time. CONFIDENTIALTY NOTICE: This fax transmission is intended only for the addressee. It contains information that is legally privileged, confidential or otherwise protected from use or disclosure. If you are not the intended recipient, you are strictly prohibited from reviewing, disclosing, copying using or disseminating any of this information or taking any action in reliance on or regarding this information. If you have received this fax in error, please notify us immediately by telephone so that we can arrange for its return to [place]. Phone: [phone], Toll-Free: [phone], Fax: [phone] Page: 2 of 2 Call Id: [id number] [place] Understands Yes PreDisposition InappropriateToAsk Care Advice Given Per Guideline GO TO ED NOW (OR PCP TRIAGE): DRIVING: Another adult should drive. CARE ADVICE given per INFLUENZA - SEASONAL (Adult) guideline. Referrals [facility] - ED

## 2018-10-08 NOTE — Progress Notes (Signed)
Subjective:    Patient ID: Cassandra Benson, female    DOB: 04-11-1941, 78 y.o.   MRN: [id number]  HPI This is a 78 yo female who presents today with fever x 5 days. Body aches. Dry cough. Taking ibuprofen 400 mg twice a day with some relief. Good fluid intake. No SOB, no wheeze, no nasal congestion.  Has been managing blood sugars. AM glucose 113.  She went to ER last night, had blood work, didn't stay because wait was over 8 hours and she felt terrible. Positive for influenza B.   Past Medical History:  Diagnosis Date  . Allergy   . ANA positive    positive ANA pattern 1 speckled  . Arthritis   . Carotid stenosis, asymptomatic 06/19/2015   4-08% RICA 14-48% LICA rpt 1 yr (09/8561)   . Dermatomyositis ([place])   . Diabetes mellitus without complication (HCC)    Type 1  . Family history of adverse reaction to anesthesia    brothr went into cardiac arrest from anectine  . Fibromyalgia    prior PCP  . GERD (gastroesophageal reflux disease)    prior PCP  . Glaucoma    Narrow angle  . History of blood clots    DVT, in 20s, none since  . History of chicken pox   . History of diverticulitis   . History of pericarditis 1986   with hospitalization  . History of pneumonia 2014  . History of shingles   . History of UTI   . Hyperlipidemia   . Hypertension   . Hypothyroidism   . Lupus (systemic lupus erythematosus) ([place])   . Mixed connective tissue disease ([place])   . PONV (postoperative nausea and vomiting)   . Raynaud's disease without gangrene   . Shoulder pain left   h/o RTC tendonitis and adhesive capsulitis  . Sjogren's syndrome ([place])   . Sleep apnea    prior PCP - no CPAP for about 10 yrs  . 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[place])   . Vitamin D deficiency    prior PCP   Past Surgical History:  Procedure Laterality Date  . ABDOMINAL HYSTERECTOMY  1978   fibroids and menorrhagia, ovaries remain  . ARTERY BIOPSY Right 04/06/2018   Procedure: BIOPSY TEMPORAL ARTERY RIGHT;  Surgeon:  Beverly Gust, MD;  Location: [facility];  Service: ENT;  Laterality: Right;  Diabetic - insulin pump sleep apnea  . [facility] normal per patient  . COLONOSCOPY WITH ESOPHAGOGASTRODUODENOSCOPY (EGD)  03/2007   2 ulcers, benign polyp, rpt 5 yrs ([place], [place])  . PARTIAL HIP ARTHROPLASTY  2013   Right hip replacement  . TONSILLECTOMY AND ADENOIDECTOMY    . TRANSCAROTID ARTERY REVASCULARIZATION Left 07/23/2018   Procedure: TRANSCAROTID ARTERY REVASCULARIZATION;  Surgeon: Marty Heck, MD;  Location: [facility];  Service: Vascular;  Laterality: Left;  . TUBAL LIGATION    . VAGINAL DELIVERY     x2, no complications   Family History  Problem Relation Age of Onset  . CAD Mother 81       MI, aortic valve issues  . COPD Mother   . Lupus Mother   . Graves' disease Mother   . Rheum arthritis Mother   . CAD Father 49       CABG x2, aortic valve replacement  . Stroke Sister   . Alcohol abuse Brother   . CAD Brother 27       MI  . Stroke  Brother   . Seizures Son   . COPD Brother   . CAD Brother 31       stent  . Diabetes Brother   . Depression Grandchild   . Cancer Maternal Aunt        breast  . Breast cancer Maternal Aunt   . Diabetes Sister        deceased  . CAD Sister 62       stents  . Breast cancer Maternal Aunt    Social History   Tobacco Use  . Smoking status: Never Smoker  . Smokeless tobacco: Never Used  Substance Use Topics  . Alcohol use: No  . Drug use: No     Review of Systems Per HPI    Objective:   Physical Exam Vitals signs reviewed.  Constitutional:      General: She is not in acute distress.    Appearance: Normal appearance. She is normal weight. She is ill-appearing. She is not toxic-appearing or diaphoretic.  HENT:     Head: Normocephalic and atraumatic.     Right Ear: Tympanic membrane, ear canal and external ear normal.     Left Ear: Tympanic membrane, ear canal and external ear  normal.     Nose: Nose normal.     Mouth/Throat:     Mouth: Mucous membranes are moist.     Pharynx: Oropharynx is clear.  Eyes:     Conjunctiva/sclera: Conjunctivae normal.  Neck:     Musculoskeletal: Normal range of motion and neck supple.  Cardiovascular:     Rate and Rhythm: Normal rate and regular rhythm.     Pulses: Normal pulses.     Heart sounds: Normal heart sounds.  Lymphadenopathy:     Cervical: No cervical adenopathy.  Skin:    General: Skin is warm and dry.  Neurological:     Mental Status: She is alert and oriented to person, place, and time.  Psychiatric:        Mood and Affect: Mood normal.        Behavior: Behavior normal.        Thought Content: Thought content normal.        Judgment: Judgment normal.       BP 124/60   Pulse 100   Temp 99.8 F (37.7 C) (Oral)   Ht 5' 3.75" (1.619 m)   Wt 151 lb (68.5 kg)   SpO2 96%   BMI 26.12 kg/m  Wt Readings from Last 3 Encounters:  10/08/18 151 lb (68.5 kg)  10/04/18 153 lb (69.4 kg)  09/04/18 151 lb 8 oz (68.7 kg)       Assessment & Plan:  1. Influenza B - Provided written and verbal information regarding diagnosis and treatment. - Discussed RTC precautions, importance of hydration and symptom control - benzonatate (TESSALON) 100 MG capsule; Take 1-2 capsules (100-200 mg total) by mouth 3 (three) times daily as needed.  Dispense: 30 capsule; Refill: 0   Clarene Reamer, FNP-BC  [facility]  10/09/2018 8:00 AM

## 2018-10-08 NOTE — Telephone Encounter (Signed)
I spoke with pt and she did leave ED due to 8 hr wait. Pt scheduled appt with Glenda Chroman FNP 10/08/18 at 2 PM.

## 2018-10-08 NOTE — Patient Instructions (Signed)
Good to see you today You can alternate Tylenol and ibuprofen for fever/pain Follow up if you are still running fever over 101 or if you develop shortness of breath or increased phlegm  Influenza, Adult Influenza is also called "the flu." It is an infection in the lungs, nose, and throat (respiratory tract). It is caused by a virus. The flu causes symptoms that are similar to symptoms of a cold. It also causes a high fever and body aches. The flu spreads easily from person to person (is contagious). Getting a flu shot (influenza vaccination) every year is the best way to prevent the flu. What are the causes? This condition is caused by the influenza virus. You can get the virus by:  Breathing in droplets that are in the air from the cough or sneeze of a person who has the virus.  Touching something that has the virus on it (is contaminated) and then touching your mouth, nose, or eyes. What increases the risk? Certain things may make you more likely to get the flu. These include:  Not washing your hands often.  Having close contact with many people during cold and flu season.  Touching your mouth, eyes, or nose without first washing your hands.  Not getting a flu shot every year. You may have a higher risk for the flu, along with serious problems such as a lung infection (pneumonia), if you:  Are older than 65.  Are pregnant.  Have a weakened disease-fighting system (immune system) because of a disease or taking certain medicines.  Have a long-term (chronic) illness, such as: ? Heart, kidney, or lung disease. ? Diabetes. ? Asthma.  Have a liver disorder.  Are very overweight (morbidly obese).  Have anemia. This is a condition that affects your red blood cells. What are the signs or symptoms? Symptoms usually begin suddenly and last 4-14 days. They may include:  Fever and chills.  Headaches, body aches, or muscle aches.  Sore throat.  Cough.  Runny or stuffy  (congested) nose.  Chest discomfort.  Not wanting to eat as much as normal (poor appetite).  Weakness or feeling tired (fatigue).  Dizziness.  Feeling sick to your stomach (nauseous) or throwing up (vomiting). How is this treated? If the flu is found early, you can be treated with medicine that can help reduce how bad the illness is and how long it lasts (antiviral medicine). This may be given by mouth (orally) or through an IV tube. Taking care of yourself at home can help your symptoms get better. Your doctor may suggest:  Taking over-the-counter medicines.  Drinking plenty of fluids. The flu often goes away on its own. If you have very bad symptoms or other problems, you may be treated in a hospital. Follow these instructions at home:     Activity  Rest as needed. Get plenty of sleep.  Stay home from work or school as told by your doctor. ? Do not leave home until you do not have a fever for 24 hours without taking medicine. ? Leave home only to visit your doctor. Eating and drinking  Take an ORS (oral rehydration solution). This is a drink that is sold at pharmacies and stores.  Drink enough fluid to keep your pee (urine) pale yellow.  Drink clear fluids in small amounts as you are able. Clear fluids include: ? Water. ? Ice chips. ? Fruit juice that has water added (diluted fruit juice). ? Low-calorie sports drinks.  Eat bland, easy-to-digest foods in  small amounts as you are able. These foods include: ? Bananas. ? Applesauce. ? Rice. ? Lean meats. ? Toast. ? Crackers.  Do not eat or drink: ? Fluids that have a lot of sugar or caffeine. ? Alcohol. ? Spicy or fatty foods. General instructions  Take over-the-counter and prescription medicines only as told by your doctor.  Use a cool mist humidifier to add moisture to the air in your home. This can make it easier for you to breathe.  Cover your mouth and nose when you cough or sneeze.  Wash your hands  with soap and water often, especially after you cough or sneeze. If you cannot use soap and water, use alcohol-based hand sanitizer.  Keep all follow-up visits as told by your doctor. This is important. How is this prevented?   Get a flu shot every year. You may get the flu shot in late summer, fall, or winter. Ask your doctor when you should get your flu shot.  Avoid contact with people who are sick during fall and winter (cold and flu season). Contact a doctor if:  You get new symptoms.  You have: ? Chest pain. ? Watery poop (diarrhea). ? A fever.  Your cough gets worse.  You start to have more mucus.  You feel sick to your stomach.  You throw up. Get help right away if you:  Have shortness of breath.  Have trouble breathing.  Have skin or nails that turn a bluish color.  Have very bad pain or stiffness in your neck.  Get a sudden headache.  Get sudden pain in your face or ear.  Cannot eat or drink without throwing up. Summary  Influenza ("the flu") is an infection in the lungs, nose, and throat. It is caused by a virus.  Take over-the-counter and prescription medicines only as told by your doctor.  Getting a flu shot every year is the best way to avoid getting the flu. This information is not intended to replace advice given to you by your health care provider. Make sure you discuss any questions you have with your health care provider. Document Released: 06/21/2008 Document Revised: 02/28/2018 Document Reviewed: 02/28/2018 Elsevier Interactive Patient Education  2019 [employer].

## 2018-10-08 NOTE — Telephone Encounter (Signed)
Noted  

## 2018-10-09 ENCOUNTER — Encounter: Payer: Self-pay | Admitting: Family Medicine

## 2018-10-11 ENCOUNTER — Encounter: Payer: Self-pay | Admitting: Family Medicine

## 2018-10-11 NOTE — Progress Notes (Signed)
BP 114/62 (BP Location: Left Arm, Patient Position: Sitting, Cuff Size: Normal)   Pulse 88   Temp 97.6 F (36.4 C) (Oral)   Ht 5' 3.75" (1.619 m)   Wt 150 lb 4 oz (68.2 kg)   SpO2 93%   BMI 25.99 kg/m    CC: ER f/u visit Subjective:    Patient ID: Cassandra Benson, female    DOB: July 19, 1941, 78 y.o.   MRN: [id number]  HPI: Cassandra Benson is a 78 y.o. female presenting on 10/12/2018 for Hospitalization Follow-up (Seen at [facility] ED on 10/08/18.)   Seen 10/07/2018 PM at ER with influenza B diagnosis, did not stay due to prolonged wait time. Saw Debbie later that day who treated with tessalon perls. Was outside of window for tamiflu. ER records reviewed - CXR was normal.   Sudden onset symptoms 10/04/2018. Body aches, cough, chest pressure with coughing, fatigue. Tmax 102.   Tessalon perls are helpful but sedating.  Ongoing coughing spasms throughout the night.  Ongoing chest discomfort with deep breathing, heaviness in chest, dyspnea.  Ongoing fatigue and muscle weakness.  Fevers have resolved.   She was worried about EKG mentioning she needed BNP.  EKG reviewed      Relevant past medical, surgical, family and social history reviewed and updated as indicated. Interim medical history since our last visit reviewed. Allergies and medications reviewed and updated. Outpatient Medications Prior to Visit  Medication Sig Dispense Refill  . aspirin EC 81 MG tablet Take 81 mg by mouth daily.    . BD ULTRA-FINE LANCETS lancets Use as instructed upto 6 times daily 600 each 2  . benzocaine (ORAJEL) 10 % mucosal gel Use as directed 1 application in the mouth or throat 4 (four) times daily as needed for mouth pain.    . benzonatate (TESSALON) 100 MG capsule Take 1-2 capsules (100-200 mg total) by mouth 3 (three) times daily as needed. 30 capsule 0  . clopidogrel (PLAVIX) 75 MG tablet Take 1 tablet (75 mg total) by mouth daily. 30 tablet 6  . diclofenac sodium (VOLTAREN) 1 % GEL Apply 1  application topically 3 (three) times daily. 1 Tube 1  . glucose blood (CONTOUR NEXT TEST) test strip Use as instructed to check 4 times daily 400 each 5  . insulin aspart (NOVOLOG) 100 UNIT/ML injection Use in an insulin pump upto 50 units per day (Patient taking differently: See admin instructions. Use in an insulin pump upto 50 units per day) 2 vial 6  . Krill Oil 500 MG CAPS Take 2 capsules (1,000 mg total) by mouth daily. (Patient taking differently: Take 500 mg by mouth daily. )    . lisinopril (PRINIVIL,ZESTRIL) 10 MG tablet Take 10 mg by mouth daily.    . naproxen sodium (ALEVE) 220 MG tablet Take 220 mg by mouth 2 (two) times daily as needed (for osteoarthritis).     Marland Kitchen neomycin-polymyxin-dexamethasone (MAXITROL) 0.1 % ophthalmic suspension Place 1 drop into both eyes daily.    . nitroGLYCERIN (NITROSTAT) 0.4 MG SL tablet Place 1 tablet (0.4 mg total) under the tongue every 5 (five) minutes as needed for chest pain. 30 tablet 1  . pantoprazole (PROTONIX) 40 MG tablet Take 1 tablet (40 mg total) by mouth daily. (Patient taking differently: Take 40 mg by mouth daily. Takes prn heart burn) 30 tablet 3  . rosuvastatin (CRESTOR) 10 MG tablet Take 1 tablet (10 mg total) by mouth every other day. (Patient taking differently: Take 10 mg by  mouth daily. ) 30 tablet 6  . SYNTHROID 75 MCG tablet TAKE 1 TABLET BY MOUTH ONCE DAILY FOR 6 DAYS AND 1 AND 1/2 TABLETS ON THE 7TH DAY (Patient taking differently: Take 37.5-75 mcg by mouth See admin instructions. Take 37.5 mcg by mouth daily on Sunday. Take 75 mcg by mouth daily on all other days.) 90 tablet 3  . traMADol (ULTRAM) 50 MG tablet Take 0.5-1 tablets (25-50 mg total) by mouth 2 (two) times daily as needed for moderate pain. 8 tablet 0   No facility-administered medications prior to visit.      Per HPI unless specifically indicated in ROS section below Review of Systems Objective:    BP 114/62 (BP Location: Left Arm, Patient Position: Sitting, Cuff  Size: Normal)   Pulse 88   Temp 97.6 F (36.4 C) (Oral)   Ht 5' 3.75" (1.619 m)   Wt 150 lb 4 oz (68.2 kg)   SpO2 93%   BMI 25.99 kg/m   Wt Readings from Last 3 Encounters:  10/12/18 150 lb 4 oz (68.2 kg)  10/08/18 151 lb (68.5 kg)  10/04/18 153 lb (69.4 kg)    Physical Exam Vitals signs and nursing note reviewed.  Constitutional:      General: She is not in acute distress.    Appearance: Normal appearance. She is well-developed.     Comments: Tired appearing  HENT:     Head: Normocephalic and atraumatic.     Right Ear: Hearing, tympanic membrane, ear canal and external ear normal.     Left Ear: Hearing, tympanic membrane, ear canal and external ear normal.     Nose: Nose normal. No mucosal edema, congestion or rhinorrhea.     Right Sinus: No maxillary sinus tenderness or frontal sinus tenderness.     Left Sinus: No maxillary sinus tenderness or frontal sinus tenderness.     Mouth/Throat:     Mouth: Mucous membranes are moist.     Pharynx: Oropharynx is clear. Uvula midline. No oropharyngeal exudate or posterior oropharyngeal erythema.     Tonsils: No tonsillar abscesses.  Eyes:     General: No scleral icterus.    Conjunctiva/sclera: Conjunctivae normal.     Pupils: Pupils are equal, round, and reactive to light.  Neck:     Musculoskeletal: Normal range of motion and neck supple.  Cardiovascular:     Rate and Rhythm: Normal rate and regular rhythm.     Pulses: Normal pulses.     Heart sounds: Normal heart sounds. No murmur.  Pulmonary:     Effort: Pulmonary effort is normal. No respiratory distress.     Breath sounds: Normal breath sounds. No wheezing, rhonchi or rales.     Comments: Coughing fits present, lungs clear Lymphadenopathy:     Cervical: No cervical adenopathy.  Skin:    General: Skin is warm and dry.     Findings: No rash.  Neurological:     Mental Status: She is alert.       Results for orders placed or performed during the hospital encounter of  40/98/11  Basic metabolic panel  Result Value Ref Range   Sodium 134 (L) 135 - 145 mmol/L   Potassium 4.2 3.5 - 5.1 mmol/L   Chloride 101 98 - 111 mmol/L   CO2 22 22 - 32 mmol/L   Glucose, Bld 201 (H) 70 - 99 mg/dL   BUN 18 8 - 23 mg/dL   Creatinine, Ser 0.97 0.44 - 1.00 mg/dL   Calcium  9.0 8.9 - 10.3 mg/dL   GFR calc non Af Amer 57 (L) >60 mL/min   GFR calc Af Amer >60 >60 mL/min   Anion gap 11 5 - 15  CBC  Result Value Ref Range   WBC 7.4 4.0 - 10.5 K/uL   RBC 5.02 3.87 - 5.11 MIL/uL   Hemoglobin 13.1 12.0 - 15.0 g/dL   HCT 41.6 36.0 - 46.0 %   MCV 82.9 80.0 - 100.0 fL   MCH 26.1 26.0 - 34.0 pg   MCHC 31.5 30.0 - 36.0 g/dL   RDW 14.2 11.5 - 15.5 %   Platelets 185 150 - 400 K/uL   nRBC 0.0 0.0 - 0.2 %  Urinalysis, Complete w Microscopic  Result Value Ref Range   Color, Urine YELLOW (A) YELLOW   APPearance CLEAR (A) CLEAR   Specific Gravity, Urine 1.019 1.005 - 1.030   pH 5.0 5.0 - 8.0   Glucose, UA NEGATIVE NEGATIVE mg/dL   Hgb urine dipstick SMALL (A) NEGATIVE   Bilirubin Urine NEGATIVE NEGATIVE   Ketones, ur 5 (A) NEGATIVE mg/dL   Protein, ur NEGATIVE NEGATIVE mg/dL   Nitrite NEGATIVE NEGATIVE   Leukocytes, UA SMALL (A) NEGATIVE   RBC / HPF 6-10 0 - 5 RBC/hpf   WBC, UA 6-10 0 - 5 WBC/hpf   Bacteria, UA NONE SEEN NONE SEEN   Squamous Epithelial / LPF 0-5 0 - 5   Mucus PRESENT   Influenza panel by PCR (type A & B)  Result Value Ref Range   Influenza A By PCR NEGATIVE NEGATIVE   Influenza B By PCR POSITIVE (A) NEGATIVE   Assessment & Plan:   Problem List Items Addressed This Visit    Influenza B - Primary    Recent influenza B + swab at ER, did not receive tamiflu. Now 1 wk out from symptoms, anticipate tamiflu benefit negligent so will not start. Continued supportive care reviewed - should be starting to feel better in next 2-3 days. Update if onging symptoms into next week for further evaluation. Lung exam clear today.           No orders of the defined  types were placed in this encounter.  No orders of the defined types were placed in this encounter.   Follow up plan: Return if symptoms worsen or fail to improve.  [name], MD

## 2018-10-12 ENCOUNTER — Encounter: Payer: Self-pay | Admitting: Family Medicine

## 2018-10-12 ENCOUNTER — Ambulatory Visit (INDEPENDENT_AMBULATORY_CARE_PROVIDER_SITE_OTHER): Payer: PPO | Admitting: Family Medicine

## 2018-10-12 VITALS — BP 114/62 | HR 88 | Temp 97.6°F | Ht 63.75 in | Wt 150.2 lb

## 2018-10-12 DIAGNOSIS — J101 Influenza due to other identified influenza virus with other respiratory manifestations: Secondary | ICD-10-CM | POA: Insufficient documentation

## 2018-10-12 NOTE — Patient Instructions (Addendum)
I think symptoms are all attributable to flu.  Continue tessalon perls as needed.  Let me know if fever >101 returns or worsening productive cough, or not improving in next 3-4 days.

## 2018-10-12 NOTE — Assessment & Plan Note (Signed)
Recent influenza B + swab at ER, did not receive tamiflu. Now 1 wk out from symptoms, anticipate tamiflu benefit negligent so will not start. Continued supportive care reviewed - should be starting to feel better in next 2-3 days. Update if onging symptoms into next week for further evaluation. Lung exam clear today.

## 2018-10-18 ENCOUNTER — Other Ambulatory Visit: Payer: Self-pay | Admitting: Family Medicine

## 2018-10-18 DIAGNOSIS — Z1231 Encounter for screening mammogram for malignant neoplasm of breast: Secondary | ICD-10-CM

## 2018-10-20 NOTE — Progress Notes (Signed)
I reviewed health advisor's note, was available for consultation, and agree with documentation and plan.  

## 2018-10-30 ENCOUNTER — Encounter: Payer: Self-pay | Admitting: Family Medicine

## 2018-10-30 ENCOUNTER — Ambulatory Visit (INDEPENDENT_AMBULATORY_CARE_PROVIDER_SITE_OTHER): Payer: PPO | Admitting: Family Medicine

## 2018-10-30 VITALS — BP 172/90 | HR 114 | Temp 98.4°F | Ht 63.75 in | Wt 151.5 lb

## 2018-10-30 DIAGNOSIS — J22 Unspecified acute lower respiratory infection: Secondary | ICD-10-CM | POA: Insufficient documentation

## 2018-10-30 DIAGNOSIS — I10 Essential (primary) hypertension: Secondary | ICD-10-CM | POA: Diagnosis not present

## 2018-10-30 MED ORDER — AMLODIPINE BESYLATE 5 MG PO TABS: 5.0000 mg | ORAL_TABLET | Freq: Every day | ORAL | 3 refills | Status: DC

## 2018-10-30 MED ORDER — AZITHROMYCIN 250 MG PO TABS: ORAL_TABLET | ORAL | 0 refills | Status: DC

## 2018-10-30 MED ORDER — HYDROCOD POLST-CPM POLST ER 10-8 MG/5ML PO SUER: 5.0000 mL | Freq: Every evening | ORAL | 0 refills | Status: DC | PRN

## 2018-10-30 NOTE — Assessment & Plan Note (Addendum)
Deteriorated control - pt states elevated at home recently as well (in setting of prolonged respiratory illness). Given persistently elevated, will add amlodipine 5mg  daily and she will start monitoring BP more closely at home. Pt agrees with plan. RTC 3 wks f/u visit.

## 2018-10-30 NOTE — Assessment & Plan Note (Addendum)
Possible development of bronchitis after initial influenza. There is also evidence of pleurisy and frontal sinus facial pain. Lungs largely clear. Rec tussionex for cough. Will provide WASP for zpack antibiotic with indications when to fill. Avoid NSAID with hypertension. Schedule tylenol for pleurisy. Update if not improving with treatment.

## 2018-10-30 NOTE — Progress Notes (Signed)
BP (!) 172/90 (BP Location: Right Arm, Patient Position: Sitting, Cuff Size: Normal)   Pulse (!) 114   Temp 98.4 F (36.9 C) (Oral)   Ht 5' 3.75" (1.619 m)   Wt 151 lb 8 oz (68.7 kg)   SpO2 97%   BMI 26.21 kg/m   Also elevated on repeat.   CC: cough Subjective:    Patient ID: Cassandra Benson, female    DOB: 01-Jun-1941, 78 y.o.   MRN: [id number]  HPI: Cassandra Benson is a 78 y.o. female presenting on 10/30/2018 for Cough (C/o dry cough and sore chest due to coughing. Started about 4 wks ago. Tried elderberry lozenges and Tylenol. )   See prior notes for details. Seen here last month with influenza B diagnosis 10/07/2018 slow to recover. She continues coughing, despite tessalon perls and elderberry lozenges and OTC corcedin. Coughing fits and cough spasms. Now chest soreness from coughing. Worse with movement. Marked fatigue with exertion (ie walking 2 blocks to and from mailbox). Pain with deep breaths, cramping of sides.   Marked BP elevation, tachycardia today. Home BP readings have been elevated as well (378 systolic last week).  She has tolerated tussionex well in the past.  Upcoming endo appt.      Relevant past medical, surgical, family and social history reviewed and updated as indicated. Interim medical history since our last visit reviewed. Allergies and medications reviewed and updated. Outpatient Medications Prior to Visit  Medication Sig Dispense Refill  . aspirin EC 81 MG tablet Take 81 mg by mouth daily.    . BD ULTRA-FINE LANCETS lancets Use as instructed upto 6 times daily 600 each 2  . benzocaine (ORAJEL) 10 % mucosal gel Use as directed 1 application in the mouth or throat 4 (four) times daily as needed for mouth pain.    . benzonatate (TESSALON) 100 MG capsule Take 1-2 capsules (100-200 mg total) by mouth 3 (three) times daily as needed. 30 capsule 0  . clopidogrel (PLAVIX) 75 MG tablet Take 1 tablet (75 mg total) by mouth daily. 30 tablet 6  . diclofenac sodium  (VOLTAREN) 1 % GEL Apply 1 application topically 3 (three) times daily. 1 Tube 1  . glucose blood (CONTOUR NEXT TEST) test strip Use as instructed to check 4 times daily 400 each 5  . insulin aspart (NOVOLOG) 100 UNIT/ML injection Use in an insulin pump upto 50 units per day (Patient taking differently: See admin instructions. Use in an insulin pump upto 50 units per day) 2 vial 6  . Krill Oil 500 MG CAPS Take 2 capsules (1,000 mg total) by mouth daily. (Patient taking differently: Take 500 mg by mouth daily. )    . lisinopril (PRINIVIL,ZESTRIL) 10 MG tablet Take 10 mg by mouth daily.    . naproxen sodium (ALEVE) 220 MG tablet Take 220 mg by mouth 2 (two) times daily as needed (for osteoarthritis).     Marland Kitchen neomycin-polymyxin-dexamethasone (MAXITROL) 0.1 % ophthalmic suspension Place 1 drop into both eyes daily.    . nitroGLYCERIN (NITROSTAT) 0.4 MG SL tablet Place 1 tablet (0.4 mg total) under the tongue every 5 (five) minutes as needed for chest pain. 30 tablet 1  . pantoprazole (PROTONIX) 40 MG tablet Take 1 tablet (40 mg total) by mouth daily. (Patient taking differently: Take 40 mg by mouth daily. Takes prn heart burn) 30 tablet 3  . rosuvastatin (CRESTOR) 10 MG tablet Take 1 tablet (10 mg total) by mouth every other day. (Patient taking  differently: Take 10 mg by mouth daily. ) 30 tablet 6  . SYNTHROID 75 MCG tablet TAKE 1 TABLET BY MOUTH ONCE DAILY FOR 6 DAYS AND 1 AND 1/2 TABLETS ON THE 7TH DAY (Patient taking differently: Take 37.5-75 mcg by mouth See admin instructions. Take 37.5 mcg by mouth daily on Sunday. Take 75 mcg by mouth daily on all other days.) 90 tablet 3  . traMADol (ULTRAM) 50 MG tablet Take 0.5-1 tablets (25-50 mg total) by mouth 2 (two) times daily as needed for moderate pain. 8 tablet 0   No facility-administered medications prior to visit.      Per HPI unless specifically indicated in ROS section below Review of Systems Objective:    BP (!) 172/90 (BP Location: Right Arm,  Patient Position: Sitting, Cuff Size: Normal)   Pulse (!) 114   Temp 98.4 F (36.9 C) (Oral)   Ht 5' 3.75" (1.619 m)   Wt 151 lb 8 oz (68.7 kg)   SpO2 97%   BMI 26.21 kg/m   Wt Readings from Last 3 Encounters:  10/30/18 151 lb 8 oz (68.7 kg)  10/12/18 150 lb 4 oz (68.2 kg)  10/08/18 151 lb (68.5 kg)    Physical Exam Vitals signs and nursing note reviewed.  Constitutional:      General: She is not in acute distress.    Appearance: Normal appearance. She is well-developed.     Comments: Tired appearing  HENT:     Head: Normocephalic and atraumatic.     Right Ear: Hearing, tympanic membrane, ear canal and external ear normal.     Left Ear: Hearing, tympanic membrane, ear canal and external ear normal.     Nose: No mucosal edema, congestion or rhinorrhea.     Right Sinus: Maxillary sinus tenderness present. No frontal sinus tenderness.     Left Sinus: Maxillary sinus tenderness present. No frontal sinus tenderness.     Mouth/Throat:     Mouth: Mucous membranes are moist.     Pharynx: Uvula midline. No oropharyngeal exudate or posterior oropharyngeal erythema.     Tonsils: No tonsillar abscesses.  Eyes:     General: No scleral icterus.    Conjunctiva/sclera: Conjunctivae normal.     Pupils: Pupils are equal, round, and reactive to light.  Neck:     Musculoskeletal: Normal range of motion and neck supple.  Cardiovascular:     Rate and Rhythm: Normal rate and regular rhythm.     Pulses: Normal pulses.     Heart sounds: Normal heart sounds. No murmur.  Pulmonary:     Effort: Pulmonary effort is normal. No respiratory distress.     Breath sounds: Normal breath sounds. No wheezing, rhonchi or rales.     Comments: Lungs clear, coughing fits present Lymphadenopathy:     Cervical: No cervical adenopathy.  Skin:    General: Skin is warm and dry.     Findings: No rash.  Neurological:     Mental Status: She is alert.       Assessment & Plan:   Problem List Items Addressed  This Visit    Essential hypertension    Deteriorated control - pt states elevated at home recently as well (in setting of prolonged respiratory illness). Given persistently elevated, will add amlodipine 5mg  daily and she will start monitoring BP more closely at home. Pt agrees with plan. RTC 3 wks f/u visit.       Relevant Medications   amLODipine (NORVASC) 5 MG tablet  Acute lower respiratory infection - Primary    Possible development of bronchitis after initial influenza. There is also evidence of pleurisy and frontal sinus facial pain. Lungs largely clear. Rec tussionex for cough. Will provide WASP for zpack antibiotic with indications when to fill. Avoid NSAID with hypertension. Schedule tylenol for pleurisy. Update if not improving with treatment.       Relevant Medications   azithromycin (ZITHROMAX) 250 MG tablet       Meds ordered this encounter  Medications  . azithromycin (ZITHROMAX) 250 MG tablet    Sig: Take two tablets on day one followed by one tablet on days 2-5    Dispense:  6 each    Refill:  0  . amLODipine (NORVASC) 5 MG tablet    Sig: Take 1 tablet (5 mg total) by mouth daily.    Dispense:  30 tablet    Refill:  3  . DISCONTD: chlorpheniramine-HYDROcodone (TUSSIONEX PENNKINETIC ER) 10-8 MG/5ML SUER    Sig: Take 5 mLs by mouth at bedtime as needed for cough (sedation precautions).    Dispense:  100 mL    Refill:  0  . chlorpheniramine-HYDROcodone (TUSSIONEX PENNKINETIC ER) 10-8 MG/5ML SUER    Sig: Take 5 mLs by mouth at bedtime as needed for cough (sedation precautions).    Dispense:  100 mL    Refill:  0   No orders of the defined types were placed in this encounter.   Patient Instructions  Blood pressure is too high - add on amlodipine 5mg  to daily regimen if staying high at home.  You likely have some pleurisy (inflammation of lining of lungs). Treat with tussionex cough syrup for night time cough. Take tylenol 500mg  three times daily with meals for  pain.  Fill antibiotic provided today if above not helping.    Follow up plan: Return in about 3 weeks (around 11/20/2018) for follow up visit.  [name], MD

## 2018-10-30 NOTE — Patient Instructions (Addendum)
Blood pressure is too high - add on amlodipine 5mg  to daily regimen if staying high at home.  You likely have some pleurisy (inflammation of lining of lungs). Treat with tussionex cough syrup for night time cough. Take tylenol 500mg  three times daily with meals for pain.  Fill antibiotic provided today if above not helping.

## 2018-11-01 DIAGNOSIS — E119 Type 2 diabetes mellitus without complications: Secondary | ICD-10-CM | POA: Diagnosis not present

## 2018-11-01 LAB — HM DIABETES EYE EXAM

## 2018-11-06 ENCOUNTER — Encounter: Payer: Self-pay | Admitting: Internal Medicine

## 2018-11-06 ENCOUNTER — Ambulatory Visit (INDEPENDENT_AMBULATORY_CARE_PROVIDER_SITE_OTHER): Payer: PPO | Admitting: Internal Medicine

## 2018-11-06 VITALS — BP 150/70 | HR 112 | Ht 63.75 in | Wt 149.0 lb

## 2018-11-06 DIAGNOSIS — E063 Autoimmune thyroiditis: Secondary | ICD-10-CM

## 2018-11-06 DIAGNOSIS — E038 Other specified hypothyroidism: Secondary | ICD-10-CM | POA: Diagnosis not present

## 2018-11-06 DIAGNOSIS — E139 Other specified diabetes mellitus without complications: Secondary | ICD-10-CM

## 2018-11-06 LAB — POCT GLYCOSYLATED HEMOGLOBIN (HGB A1C): HEMOGLOBIN A1C: 8 % — AB (ref 4.0–5.6)

## 2018-11-06 LAB — T4, FREE: Free T4: 1.15 ng/dL (ref 0.60–1.60)

## 2018-11-06 LAB — TSH: TSH: 1.1 u[IU]/mL (ref 0.35–4.50)

## 2018-11-06 MED ORDER — GLUCAGON (RDNA) 1 MG IJ KIT: 1.0000 mg | PACK | Freq: Once | INTRAMUSCULAR | 12 refills | Status: DC | PRN

## 2018-11-06 MED ORDER — INSULIN PEN NEEDLE 32G X 4 MM MISC: 3 refills | Status: DC

## 2018-11-06 MED ORDER — INSULIN ASPART 100 UNIT/ML FLEXPEN: 6.0000 [IU] | PEN_INJECTOR | Freq: Three times a day (TID) | SUBCUTANEOUS | 11 refills | Status: DC

## 2018-11-06 MED ORDER — INSULIN DEGLUDEC 200 UNIT/ML ~~LOC~~ SOPN: 18.0000 [IU] | PEN_INJECTOR | Freq: Every day | SUBCUTANEOUS | 3 refills | Status: DC

## 2018-11-06 NOTE — Addendum Note (Signed)
Addended by: Cardell Peach I on: 11/06/2018 03:39 PM   Modules accepted: Orders

## 2018-11-06 NOTE — Progress Notes (Signed)
Patient ID: Cassandra Benson, female   DOB: 02-22-1941, 78 y.o.   MRN: [id number]   HPI: [name] is a 78 y.o.-year-old female, returning for f/u for LADA, initially dx'ed in 1998 (78 y/o), started insulin at dx, started insulin pump in ~2008, uncontrolled, without complications and hypothyroidism. She previously saw endocrinology at [facility] (Dr. Janese Banks) and Dr Howell Rucks. Last visit with me 4 months ago.  She recently had carotid surgery 06/2018 -Dr. Monica Martinez.  She was diagnosed with hypertension since last visit. Started Amlodipine.  She had several Prednisone courses since last OV.  Last hemoglobin A1c was: Lab Results  Component Value Date   HGBA1C 8.1 (H) 06/11/2018   HGBA1C 7.7 (A) 02/28/2018   HGBA1C 6.7 10/30/2017   She is on an insulin pump: - Medtronic 723-started 09/2016 (changed 07/2017), without CGM, uses NovoLog in the pump.  She was using [employer] for supplies before, but now forced by insurance to use [facility]. She has a lot of pbs with supplies by them.   She she was previously on Glucophage ER d.a.w. but had to stop due to not being able to obtain it.  Generic metformin ER gave her diarrhea in the past.  Pump settings: - basal rates: 12 am: 0.800 units/h 3 am: 0.875 9:30 am: 0.100 5 pm: 1.500 6:30 pm: 1.300 11:30 pm: 1.800  - ICR:   12 am: 10 (8 if on prednisone) 6 pm: 8 (6 if on Prednisone) - target: 130-130 except 6-8 am and 6 pm-12 am: 100-100 - ISF: 35 - Insulin on Board: 4h - bolus wizard: On TDD from basal insulin: 69% TDD from bolus insulin: 31% Total daily dose: 30 units per day (up to 40 units for prescription) - extended bolusing: Not using - changes infusion site: q 5-6 days! - Meter: Bayer Contour   Pt checks her sugars 2.1 times a day: - am: 98-137, 148 >> 125, 139-147 175 >> 113-168, 181 - 2h after b'fast:  n/c >> 107-148 >> n/c >> n/c - before lunch: 97-143, 155 >> 118-169 >> 127-286 - 2h after lunch: 93 >> 87-129 >> 120, 141 >>  n/c - before dinner: 95-173 >> 106-130 >> 136-209 - 2h after dinner: 182, 244 >> 290, 392 >> 118-207, 247 - bedtime: n/c >> 171 - nighttime:   92-141, 183 >> n/c Lowest sugar was 43 >> .Marland KitchenMarland Kitchen 45 (at night) >> 90 (?); she has hypoglycemia awareness in the 80s.  No history of hypoglycemia admission.  She does have a glucagon kit at home.  Highest sugar was 315 >> 300 (prednisone) >> 300s on Pred. >> 400 on Pred;  No history of DKA admissions  Pt's meals are: - Breakfast: protein drink + almond milk - Lunch: PB jelly sandwich or sandwich with ham or cream cheese sandwich - Dinner: salads  - Snacks: pretzels; pork tenderloin + veggies; chicken + tenderloin  -No CKD, last BUN/creatinine:  Lab Results  Component Value Date   BUN 18 10/07/2018   BUN 9 08/29/2018   CREATININE 0.97 10/07/2018   CREATININE 0.85 08/29/2018  On lisinopril. -+ HL;  last set of lipids: Lab Results  Component Value Date   CHOL 155 08/29/2018   HDL 43.30 08/29/2018   LDLCALC 88 08/29/2018   LDLDIRECT 177.0 08/09/2016   TRIG 115.0 08/29/2018   CHOLHDL 4 08/29/2018  On Crestor 5, but she does have muscle cramps.  She is on co-Q10.  Praluent was not covered. - last eye exam was in 10/2017:  No DR -Denies numbness and tingling in her feet.  She was admitted for CP + SOB 10/01/2017.  Cardiac events and PE were ruled out.  She continues to have shortness of breath.  She is seeing cardiology (Dr. Rockey Situ).  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  Hypothyroidism: -Due to Hashimoto's thyroiditis -+ Family history of Graves' disease in mother - She was initially on Levoxyl 75 mcg daily 1 tab 6/7 days and 1.5 tabs 1/7 days, but she thought Levoxyl increased her lipid levels and caused hair loss, so we changed to Synthroid.  She feels much better on brand name.  She currently is on Synthroid 75 mcg 1 tab 6/7 days and 0.5 tabs 1/7 days.  She takes  the Synthroid: - in am - fasting - at least 30 min from b'fast - no Ca, Fe, PPIs - + MVI at night - not on Biotin  On this dose, latest TSH was suppressed (unclear if she was taking prednisone at that time), previously normal: Lab Results  Component Value Date   TSH 0.09 (L) 08/29/2018   TSH 0.92 11/27/2017   TSH 0.30 (L) 10/11/2017   TSH 0.24 (L) 08/11/2017   TSH 0.58 01/04/2017   TSH 0.42 08/09/2016   TSH 1.060 01/11/2016   TSH 2.90 06/08/2015   TSH 0.62 01/29/2015   TSH 0.65 07/17/2014   She also has SLE.  She also has Sjogren's syndrome, diagnosed by Dr. Milly Jakob.  We checked her hormones in the setting of hirsutism and acne and the levels were normal: Component     Latest Ref Rng & Units 09/02/2014 09/08/2014  Testosterone     3 - 41 ng/dL 59 (H)   Testosterone Free     0.0 - 4.2 pg/mL 2.1   FSH     mIU/mL  68.6  LH     mIU/mL  43.4  DHEA-SO4     7 - 177 ug/dL 49   Cortisol, Plasma     ug/dL  1.3 (Normal Dexamethasone suppression test)   Estradiol     pg/mL  24.0   Also: Component     Latest Ref Rng & Units 01/04/2017  Cortisol - AM     mcg/dL 17.6  C206 ACTH     6 - 50 pg/mL 15   No signs of adrenal insufficiency.  ROS: Constitutional: no weight gain/+ weight loss, + fatigue, no subjective hyperthermia, no subjective hypothermia Eyes: no blurry vision, no xerophthalmia ENT: no sore throat, no nodules palpated in neck, no dysphagia, no odynophagia, no hoarseness Cardiovascular: no CP/no SOB/no palpitations/no leg swelling Respiratory: + Cough/no SOB/no wheezing Gastrointestinal: no N/no V/no D/no C/no acid reflux Musculoskeletal: + Muscle aches/+ joint aches Skin: no rashes, + hair loss, + excessive hair growth on face Neurological: no tremors/no numbness/no tingling/no dizziness, + headaches resolved after her carotid surgery  I reviewed pt's medications, allergies, PMH, social hx, family hx, and changes were documented in the history of present illness.  Otherwise, unchanged from my initial visit note.   Past Medical History:  Diagnosis Date  . Allergy   . ANA positive    positive ANA pattern 1 speckled  . Arthritis   . Carotid stenosis, asymptomatic 06/19/2015   5-59% RICA 74-16% LICA rpt 1 yr (11/8451)   . Dermatomyositis ([place])   . Diabetes mellitus without complication (HCC)    Type 1  . Family history of adverse reaction to anesthesia  brothr went into cardiac arrest from anectine  . Fibromyalgia    prior PCP  . GERD (gastroesophageal reflux disease)    prior PCP  . Glaucoma    Narrow angle  . History of blood clots    DVT, in 20s, none since  . History of chicken pox   . History of diverticulitis   . History of pericarditis 1986   with hospitalization  . History of pneumonia 2014  . History of shingles   . History of UTI   . Hyperlipidemia   . Hypertension   . Hypothyroidism   . Lupus (systemic lupus erythematosus) ([place])   . Mixed connective tissue disease ([place])   . PONV (postoperative nausea and vomiting)   . Raynaud's disease without gangrene   . Shoulder pain left   h/o RTC tendonitis and adhesive capsulitis  . Sjogren's syndrome ([place])   . Sleep apnea    prior PCP - no CPAP for about 10 yrs  . 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[place])   . Vitamin D deficiency    prior PCP   Past Surgical History:  Procedure Laterality Date  . ABDOMINAL HYSTERECTOMY  1978   fibroids and menorrhagia, ovaries remain  . ARTERY BIOPSY Right 04/06/2018   Procedure: BIOPSY TEMPORAL ARTERY RIGHT;  Surgeon: Beverly Gust, MD;  Location: [facility];  Service: ENT;  Laterality: Right;  Diabetic - insulin pump sleep apnea  . [facility] normal per patient  . COLONOSCOPY WITH ESOPHAGOGASTRODUODENOSCOPY (EGD)  03/2007   2 ulcers, benign polyp, rpt 5 yrs ([place], [place])  . PARTIAL HIP ARTHROPLASTY  2013   Right hip replacement  . TONSILLECTOMY AND ADENOIDECTOMY    . TRANSCAROTID ARTERY  REVASCULARIZATION Left 07/23/2018   Procedure: TRANSCAROTID ARTERY REVASCULARIZATION;  Surgeon: Marty Heck, MD;  Location: [facility];  Service: Vascular;  Laterality: Left;  . TUBAL LIGATION    . VAGINAL DELIVERY     x2, no complications   Social History   Social History  . Widowed   . Number of children: 2   Occupational History  Retired    Social History Main Topics  . Smoking status: Never Smoker  . Smokeless tobacco: Never Used  . Alcohol use No  . Drug use: No   Social History Narrative   Lives in [place] now. Recently moved from [place].   No pets.   Mother of Gisella Alwine.   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 amLODipine (NORVASC) 5 MG tablet Take 1 tablet (5 mg total) by mouth daily. 30 tablet 3  . aspirin EC 81 MG tablet Take 81 mg by mouth daily.    Marland Kitchen azithromycin (ZITHROMAX) 250 MG tablet Take two tablets on day one followed by one tablet on days 2-5 6 each 0  . BD ULTRA-FINE LANCETS lancets Use as instructed upto 6 times daily 600 each 2  . benzocaine (ORAJEL) 10 % mucosal gel Use as directed 1 application in the mouth or throat 4 (four) times daily as needed for mouth pain.    . benzonatate (TESSALON) 100 MG capsule Take 1-2 capsules (100-200 mg total) by mouth 3 (three) times daily  as needed. 30 capsule 0  . chlorpheniramine-HYDROcodone (TUSSIONEX PENNKINETIC ER) 10-8 MG/5ML SUER Take 5 mLs by mouth at bedtime as needed for cough (sedation precautions). 100 mL 0  . clopidogrel (PLAVIX) 75 MG tablet Take 1 tablet (75 mg total) by mouth daily. 30 tablet 6  . diclofenac sodium (VOLTAREN) 1 % GEL Apply 1 application topically 3 (three) times daily. 1 Tube 1  .  glucose blood (CONTOUR NEXT TEST) test strip Use as instructed to check 4 times daily 400 each 5  . insulin aspart (NOVOLOG) 100 UNIT/ML injection Use in an insulin pump upto 50 units per day (Patient taking differently: See admin instructions. Use in an insulin pump upto 50 units per day) 2 vial 6  . Krill Oil 500 MG CAPS Take 2 capsules (1,000 mg total) by mouth daily. (Patient taking differently: Take 500 mg by mouth daily. )    . lisinopril (PRINIVIL,ZESTRIL) 10 MG tablet Take 10 mg by mouth daily.    . naproxen sodium (ALEVE) 220 MG tablet Take 220 mg by mouth 2 (two) times daily as needed (for osteoarthritis).     Marland Kitchen neomycin-polymyxin-dexamethasone (MAXITROL) 0.1 % ophthalmic suspension Place 1 drop into both eyes daily.    . nitroGLYCERIN (NITROSTAT) 0.4 MG SL tablet Place 1 tablet (0.4 mg total) under the tongue every 5 (five) minutes as needed for chest pain. 30 tablet 1  . pantoprazole (PROTONIX) 40 MG tablet Take 1 tablet (40 mg total) by mouth daily. (Patient taking differently: Take 40 mg by mouth daily. Takes prn heart burn) 30 tablet 3  . rosuvastatin (CRESTOR) 10 MG tablet Take 1 tablet (10 mg total) by mouth every other day. (Patient taking differently: Take 10 mg by mouth daily. ) 30 tablet 6  . SYNTHROID 75 MCG tablet TAKE 1 TABLET BY MOUTH ONCE DAILY FOR 6 DAYS AND 1 AND 1/2 TABLETS ON THE 7TH DAY (Patient taking differently: Take 37.5-75 mcg by mouth See admin instructions. Take 37.5 mcg by mouth daily on Sunday. Take 75 mcg by mouth daily on all other days.) 90 tablet 3  . traMADol (ULTRAM) 50 MG tablet Take 0.5-1 tablets (25-50 mg total) by mouth 2 (two) times daily as needed for moderate pain. 8 tablet 0   No current facility-administered medications on file prior to visit.    Allergies  Allergen Reactions  . Iodinated Diagnostic Agents Other (See Comments)    Itching (severe) and chest tightness  . Penicillins Anaphylaxis, Swelling, Rash and Other (See Comments)    Has  patient had a PCN reaction causing immediate rash, facial/tongue/throat swelling, SOB or lightheadedness with hypotension: Yes Has patient had a PCN reaction causing severe rash involving mucus membranes or skin necrosis: Unknown Has patient had a PCN reaction that required hospitalization: Unknown Has patient had a PCN reaction occurring within the last 10 years: No If all of the above answers are "NO", then may proceed with Cephalosporin use.   Marland Kitchen Anectine [Succinylcholine] Other (See Comments)    Brother went into cardiac arrest.  . Codeine Nausea Only  . Gabapentin Other (See Comments)    Gait abnormality  . Influenza Vaccines Other (See Comments)    Muscle weakness; unable to walk  . Nortriptyline Other (See Comments)    Eye swelling and mouth drawed up  . Pamelor [Nortriptyline Hcl] Other (See Comments)    Patient states caused her face to draw in together.  . Valsartan Other (See Comments) and Cough    Allergy to generic  only, "Hacking" cough  . Erythromycin Rash and Swelling  . Sulfa Antibiotics Rash   Family History  Problem Relation Age of Onset  . CAD Mother 84       MI, aortic valve issues  . COPD Mother   . Lupus Mother   . Graves' disease Mother   . Rheum arthritis Mother   . CAD Father 36       CABG x2, aortic valve replacement  . Stroke Sister   . Alcohol abuse Brother   . CAD Brother 31       MI  . Stroke Brother   . Seizures Son   . COPD Brother   . CAD Brother 20       stent  . Diabetes Brother   . Depression Grandchild   . Cancer Maternal Aunt        breast  . Breast cancer Maternal Aunt   . Diabetes Sister        deceased  . CAD Sister 62       stents  . Breast cancer Maternal Aunt    PE: BP (!) 150/70   Pulse (!) 112   Ht 5' 3.75" (1.619 m)   Wt 149 lb (67.6 kg)   BMI 25.78 kg/m  Wt Readings from Last 3 Encounters:  11/06/18 149 lb (67.6 kg)  10/30/18 151 lb 8 oz (68.7 kg)  10/12/18 150 lb 4 oz (68.2 kg)   Constitutional: Normal  weight, in NAD Eyes: PERRLA, EOMI, no exophthalmos ENT: moist mucous membranes, no thyromegaly, no cervical lymphadenopathy Cardiovascular: tachycardia, RR, No MRG Respiratory: CTA B Gastrointestinal: abdomen soft, NT, ND, BS+ Musculoskeletal: no deformities, strength intact in all 4 Skin: moist, warm, no rashes Neurological: no tremor with outstretched hands, DTR normal in all 4  ASSESSMENT: 1. DM1, uncontrolled, without long-term complications, but with hyperglycemia  2.  Hashimoto's hypothyroidism  PLAN:  1. Patient with longstanding, uncontrolled, LADA, on insulin pump and previously on Glucophage ER d.a.w., which stopped being covered by her insurance.  She cannot use metformin ER generic so she is now off metformin altogether. -At last visit, we discussed about changing her insulin site every 3 to 4 days rather than every 5 to 6 days to avoid development of scar tissue and unpredictable blood sugars be on day 4.  I also noticed that she did not enter carbs and check sugars as frequently as before and did more blind/correction boluses without using the bolus wizard.  At that time, she had several courses of prednisone as a new medical issues.  She was planning to start checking sugars more often and enter carbs more consistently after her carotid surgery.  Her sugars were acceptable at the beginning of the day, at goal in the middle of the day but they were very high at that time.  We discussed about lowering her insulin to carb ratio if she has to have steroids again but we did not change the pump settings at that time. -At this visit, she tells me about the problems that she is having with [facility] not sending her supplies for the pump.  She tried to call them multiple times and they were rude to her over the phone.  At this point, she tells me that she is ready to come off the pump and switch to injections.  Therefore, at this visit, we changed her insulin pump settings to use until she  finishes the supplies, and then we also designed a basal-bolus insulin  regimen to use after she comes off the pump -we reviewed together her pump downloads -sugars are optimal slightly higher than goal in the morning and they increase throughout the day.  The highest sugars are after dinner.  She occasionally has snacks after dinner especially raising bread.  We discussed that she will need to stop these to improve her sugars in the morning. -Also, her sugars increased after coffee in the morning and I advised her to bolus 1 to 2 units with coffee -Since her basal rate is very low from 9:30 AM to 5 PM, and sugars are high around 5 PM, we will increase her basal rate slightly -Today I gave her basal-bolus insulin regimen with [name] and NovoLog to switch to after she comes off the pump (please see patient instructions) -Latest HbA1c was reviewed and this was higher, at 8.9%. - I advised her to:  Patient Instructions  Please continue: - basal rates: 12 am: 0.800 units/h 3 am: 0.875 9:30 am: 0.100 >> 0.500 5 pm: 1.500 6:30 pm: 1.300 11:30 pm: 1.800  - ICR:   12 am: 10 (8 if on prednisone) 6 pm: 8 (6 if on Prednisone) - target: 130-130 except 6-8 am and 6 pm-12 am: 100-100 - ISF: 35 - Insulin on Board: 4h  Start bolusing with coffee - 1-2 units.  If you come off pump, use the following regimen: - Tresiba U200 18 units daily - Novolog insulin to carb ratio 1:8, insulin sensitivity factor 40, target 130  Please continue: - Synthroid 75 mcg 6/7 days, 37.5 mcg 1/7 days   Take the thyroid hormone every day, with water, at least 30 minutes before breakfast, separated by at least 4 hours from: - acid reflux medications - calcium - iron - multivitamins  Please stop at the lab.  Please return in 4 months.   - today, HbA1c is 8.0% (improved) - continue checking sugars at different times of the day - check 4x a day, rotating checks - advised for yearly eye exams >> she is UTD - Return  to clinic in 3 mo with sugar log    2.  Hashimoto's hypothyroidism - latest thyroid labs reviewed with pt >> normal 11/2017, but suppressed 08/2018.  This could have been due to prednisone. - she continues on Synthroid d.a.w. 75 mcg 6 out of 7 days, 37.5 mcg 1 out of 7 days. - pt feels good on this dose. - we discussed about taking the thyroid hormone every day, with water, >30 minutes before breakfast, separated by >4 hours from acid reflux medications, calcium, iron, multivitamins. Pt. is taking it correctly. - will check thyroid tests today: TSH and fT4 - If labs are abnormal, she will need to return for repeat TFTs in 1.5 months  - time spent with the patient: 40 min, of which >50% was spent in reviewing her pump and glucometer downloads, discussing her hyper-glycemic episodes (no hypoglycemia since last visit), reviewing previous labs and pump settings and developing a plan to avoid hypo- and hyper-glycemia.  We also designed a new basal-bolus insulin regimen for her.  We also addressed her hypothyroidism.  Office Visit on 11/06/2018  Component Date Value Ref Range Status  . TSH 11/06/2018 1.10  0.35 - 4.50 uIU/mL Final  . Free T4 11/06/2018 1.15  0.60 - 1.60 ng/dL Final   Comment: Specimens from patients who are undergoing biotin therapy and /or ingesting biotin supplements may contain high levels of biotin.  The higher biotin concentration in these specimens  interferes with this Free T4 assay.  Specimens that contain high levels  of biotin may cause false high results for this Free T4 assay.  Please interpret results in light of the total clinical presentation of the patient.     Thyroid tests are normal now >> no need to change the Levothyroxine dose.  Philemon Kingdom, MD PhD [facility]

## 2018-11-06 NOTE — Patient Instructions (Addendum)
Please continue: - basal rates: 12 am: 0.800 units/h 3 am: 0.875 9:30 am: 0.100 >> 0.500 5 pm: 1.500 6:30 pm: 1.300 11:30 pm: 1.800  - ICR:   12 am: 10 (8 if on prednisone) 6 pm: 8 (6 if on Prednisone) - target: 130-130 except 6-8 am and 6 pm-12 am: 100-100 - ISF: 35 - Insulin on Board: 4h  Start bolusing with coffee - 1-2 units.  If you come off pump, let me know if I need to order: - Tresiba U200 18 units daily - Novolog insulin to carb ratio 1:8, insulin sensitivity factor 40, target 130  Please continue: - Synthroid 75 mcg 6/7 days, 37.5 mcg 1/7 days   Take the thyroid hormone every day, with water, at least 30 minutes before breakfast, separated by at least 4 hours from: - acid reflux medications - calcium - iron - multivitamins  Please stop at the lab.  Please return in 4 months.

## 2018-11-07 ENCOUNTER — Other Ambulatory Visit: Payer: Self-pay | Admitting: Pharmacist

## 2018-11-07 ENCOUNTER — Encounter: Payer: Self-pay | Admitting: Family Medicine

## 2018-11-07 NOTE — Patient Outreach (Addendum)
[facility]) Care Management  [place] - Medication Adherence   11/07/2018  VIOLA KINNICK 1941/01/14 [id number]  Target Medication: lisinopril 10 mg & rosuvastatin 10 mg Current insurance:[employer]   Outreach:  Incoming call from [name] in response to the [facility] Medication Adherence Campaign. Speak with patient. HIPAA identifiers verified. Patient reports that she is just heading out the door, but will be back this afternoon.  Return call to Ms. Phoenix. HIPAA identifiers verified.   Subjective:  Ms. Laughridge reports that she takes her lisinopril and rosuvastatin each once daily as directed. Reports that she just picked these prescriptions up from the pharmacy. Counsel patient about the cost savings of getting a 90 day supply through her [employer]. Patient states that she is not interested in getting a 90 day supply. Patient reports wanting to speak with only her providers about her medications.  Denies any specific medication questions/concerns at this time.   Objective: Lab Results  Component Value Date   CREATININE 0.97 10/07/2018   CREATININE 0.85 08/29/2018   CREATININE 0.93 07/24/2018    Lab Results  Component Value Date   HGBA1C 8.0 (A) 11/06/2018    Lipid Panel     Component Value Date/Time   CHOL 155 08/29/2018 0841   CHOL 236 01/11/2016   TRIG 115.0 08/29/2018 0841   TRIG 175 01/11/2016   HDL 43.30 08/29/2018 0841   HDL 45 01/29/2015   CHOLHDL 4 08/29/2018 0841   VLDL 23.0 08/29/2018 0841   LDLCALC 88 08/29/2018 0841   LDLCALC 160 01/11/2016   LDLDIRECT 177.0 08/09/2016 0905    BP Readings from Last 3 Encounters:  11/06/18 (!) 150/70  10/30/18 (!) 172/90  10/12/18 114/62    Allergies  Allergen Reactions  . Iodinated Diagnostic Agents Other (See Comments)    Itching (severe) and chest tightness  . Penicillins Anaphylaxis, Swelling, Rash and Other (See Comments)    Has patient had  a PCN reaction causing immediate rash, facial/tongue/throat swelling, SOB or lightheadedness with hypotension: Yes Has patient had a PCN reaction causing severe rash involving mucus membranes or skin necrosis: Unknown Has patient had a PCN reaction that required hospitalization: Unknown Has patient had a PCN reaction occurring within the last 10 years: No If all of the above answers are "NO", then may proceed with Cephalosporin use.   Marland Kitchen Anectine [Succinylcholine] Other (See Comments)    Brother went into cardiac arrest.  . Codeine Nausea Only  . Gabapentin Other (See Comments)    Gait abnormality  . Influenza Vaccines Other (See Comments)    Muscle weakness; unable to walk  . Nortriptyline Other (See Comments)    Eye swelling and mouth drawed up  . Pamelor [Nortriptyline Hcl] Other (See Comments)    Patient states caused her face to draw in together.  . Valsartan Other (See Comments) and Cough    Allergy to generic only, "Hacking" cough  . Erythromycin Rash and Swelling  . Sulfa Antibiotics Rash     Assessment:  No barrier identified; patient declined to discuss her adherence further  Plan:  Will close pharmacy episode.  Harlow Asa, PharmD, [facility] [phone]

## 2018-11-14 DIAGNOSIS — M150 Primary generalized (osteo)arthritis: Secondary | ICD-10-CM | POA: Diagnosis not present

## 2018-11-14 DIAGNOSIS — E663 Overweight: Secondary | ICD-10-CM | POA: Diagnosis not present

## 2018-11-14 DIAGNOSIS — R768 Other specified abnormal immunological findings in serum: Secondary | ICD-10-CM | POA: Diagnosis not present

## 2018-11-14 DIAGNOSIS — Z6826 Body mass index (BMI) 26.0-26.9, adult: Secondary | ICD-10-CM | POA: Diagnosis not present

## 2018-11-14 DIAGNOSIS — M797 Fibromyalgia: Secondary | ICD-10-CM | POA: Diagnosis not present

## 2018-11-14 DIAGNOSIS — I7300 Raynaud's syndrome without gangrene: Secondary | ICD-10-CM | POA: Diagnosis not present

## 2018-11-16 ENCOUNTER — Encounter: Payer: Self-pay | Admitting: Family Medicine

## 2018-11-18 ENCOUNTER — Encounter: Payer: Self-pay | Admitting: Family Medicine

## 2018-11-21 ENCOUNTER — Encounter: Payer: Self-pay | Admitting: Family Medicine

## 2018-11-21 ENCOUNTER — Ambulatory Visit (INDEPENDENT_AMBULATORY_CARE_PROVIDER_SITE_OTHER): Payer: PPO | Admitting: Family Medicine

## 2018-11-21 VITALS — BP 154/82 | HR 108 | Temp 97.6°F | Ht 63.75 in | Wt 147.2 lb

## 2018-11-21 DIAGNOSIS — I10 Essential (primary) hypertension: Secondary | ICD-10-CM

## 2018-11-21 NOTE — Patient Instructions (Addendum)
Blood pressures are looking better at home! Take lisinopril in the mornings and amlodipine in the evenings.  Let me know if ankle swelling getting worse.  Otherwise continue current regimen.

## 2018-11-21 NOTE — Assessment & Plan Note (Signed)
bp remaining elevated in office but better than last visit. Home readings actually normal range - will continue current regimen but split timing of pillls - to lisinopril in AM and amlodipine in PM. Reassess at f/u visit.

## 2018-11-21 NOTE — Progress Notes (Signed)
BP (!) 154/82 (BP Location: Right Arm, Cuff Size: Normal)   Pulse (!) 108   Temp 97.6 F (36.4 C) (Oral)   Ht 5' 3.75" (1.619 m)   Wt 147 lb 3 oz (66.8 kg)   SpO2 96%   BMI 25.46 kg/m    CC: 3 wk f/u visit Subjective:    Patient ID: Cassandra Benson, female    DOB: [birth year], 78 y.o.   MRN: [id number]  HPI: Cassandra Benson is a 78 y.o. female presenting on 11/21/2018 for Hypertension (Here for 3 wk f/u.) and Foot Swelling (C/o bilateral feet/ankle swelling. )   Seen here last month with elevated blood pressures, started on amlodipine 5mg  daily in addition to her lisinopril 10mg  daily. Here for HTN f/u. Brings log of home readings - 130-150/60-70s, HR 78-107. This week BP ranging 126-138/60-70s. She has bought new BP cuff. No vision changes, CP/tightness, SOB. Starting to have R sided headache.   Noticing some ankle swelling and increased dizziness with rapid movements. She is staying well hydrated.   Recently came off insulin pump and onto tresiba through endo Cruzita Lederer).      Relevant past medical, surgical, family and social history reviewed and updated as indicated. Interim medical history since our last visit reviewed. Allergies and medications reviewed and updated. Outpatient Medications Prior to Visit  Medication Sig Dispense Refill  . amLODipine (NORVASC) 5 MG tablet Take 1 tablet (5 mg total) by mouth daily. 30 tablet 3  . aspirin EC 81 MG tablet Take 81 mg by mouth daily.    . BD ULTRA-FINE LANCETS lancets Use as instructed upto 6 times daily 600 each 2  . benzocaine (ORAJEL) 10 % mucosal gel Use as directed 1 application in the mouth or throat 4 (four) times daily as needed for mouth pain.    . chlorpheniramine-HYDROcodone (TUSSIONEX PENNKINETIC ER) 10-8 MG/5ML SUER Take 5 mLs by mouth at bedtime as needed for cough (sedation precautions). 100 mL 0  . clopidogrel (PLAVIX) 75 MG tablet Take 1 tablet (75 mg total) by mouth daily. 30 tablet 6  . diclofenac sodium  (VOLTAREN) 1 % GEL Apply 1 application topically 3 (three) times daily. 1 Tube 1  . glucagon (GLUCAGON EMERGENCY) 1 MG injection Inject 1 mg into the muscle once as needed for up to 1 dose. 1 each 12  . glucose blood (CONTOUR NEXT TEST) test strip Use as instructed to check 4 times daily 400 each 5  . insulin aspart (NOVOLOG) 100 UNIT/ML FlexPen Inject 6-10 Units into the skin 3 (three) times daily with meals. 15 mL 11  . Insulin Degludec (TRESIBA FLEXTOUCH) 200 UNIT/ML SOPN Inject 18 Units into the skin daily. 3 pen 3  . Insulin Pen Needle 32G X 4 MM MISC Use 4-5x a day 300 each 3  . Krill Oil 500 MG CAPS Take 2 capsules (1,000 mg total) by mouth daily. (Patient taking differently: Take 500 mg by mouth daily. )    . lisinopril (PRINIVIL,ZESTRIL) 10 MG tablet Take 10 mg by mouth daily.    . naproxen sodium (ALEVE) 220 MG tablet Take 220 mg by mouth 2 (two) times daily as needed (for osteoarthritis).     Marland Kitchen neomycin-polymyxin-dexamethasone (MAXITROL) 0.1 % ophthalmic suspension Place 1 drop into both eyes daily.    . nitroGLYCERIN (NITROSTAT) 0.4 MG SL tablet Place 1 tablet (0.4 mg total) under the tongue every 5 (five) minutes as needed for chest pain. 30 tablet 1  . pantoprazole (PROTONIX) 40  MG tablet Take 1 tablet (40 mg total) by mouth daily. (Patient taking differently: Take 40 mg by mouth daily. Takes prn heart burn) 30 tablet 3  . rosuvastatin (CRESTOR) 10 MG tablet Take 1 tablet (10 mg total) by mouth every other day. (Patient taking differently: Take 10 mg by mouth daily. ) 30 tablet 6  . SYNTHROID 75 MCG tablet TAKE 1 TABLET BY MOUTH ONCE DAILY FOR 6 DAYS AND 1 AND 1/2 TABLETS ON THE 7TH DAY (Patient taking differently: Take 37.5-75 mcg by mouth See admin instructions. Take 37.5 mcg by mouth daily on Sunday. Take 75 mcg by mouth daily on all other days.) 90 tablet 3  . traMADol (ULTRAM) 50 MG tablet Take 0.5-1 tablets (25-50 mg total) by mouth 2 (two) times daily as needed for moderate pain. 8  tablet 0  . azithromycin (ZITHROMAX) 250 MG tablet Take two tablets on day one followed by one tablet on days 2-5 6 each 0  . benzonatate (TESSALON) 100 MG capsule Take 1-2 capsules (100-200 mg total) by mouth 3 (three) times daily as needed. 30 capsule 0   No facility-administered medications prior to visit.      Per HPI unless specifically indicated in ROS section below Review of Systems Objective:    BP (!) 154/82 (BP Location: Right Arm, Cuff Size: Normal)   Pulse (!) 108   Temp 97.6 F (36.4 C) (Oral)   Ht 5' 3.75" (1.619 m)   Wt 147 lb 3 oz (66.8 kg)   SpO2 96%   BMI 25.46 kg/m   Wt Readings from Last 3 Encounters:  11/21/18 147 lb 3 oz (66.8 kg)  11/06/18 149 lb (67.6 kg)  10/30/18 151 lb 8 oz (68.7 kg)    Physical Exam Vitals signs and nursing note reviewed.  Constitutional:      Appearance: Normal appearance. She is not ill-appearing.  HENT:     Mouth/Throat:     Mouth: Mucous membranes are moist.  Neck:     Vascular: No carotid bruit.  Cardiovascular:     Rate and Rhythm: Normal rate and regular rhythm.     Pulses: Normal pulses.     Heart sounds: Normal heart sounds. No murmur.  Pulmonary:     Effort: Pulmonary effort is normal. No respiratory distress.     Breath sounds: Normal breath sounds. No wheezing, rhonchi or rales.  Musculoskeletal:     Right lower leg: No edema.     Left lower leg: No edema.  Neurological:     Mental Status: She is alert.  Psychiatric:        Mood and Affect: Mood normal.       Results for orders placed or performed in visit on 11/07/18  HM DIABETES EYE EXAM  Result Value Ref Range   HM Diabetic Eye Exam No Retinopathy No Retinopathy   Lab Results  Component Value Date   CREATININE 0.97 10/07/2018   BUN 18 10/07/2018   NA 134 (L) 10/07/2018   K 4.2 10/07/2018   CL 101 10/07/2018   CO2 22 10/07/2018    Assessment & Plan:   Problem List Items Addressed This Visit    Essential hypertension - Primary    bp  remaining elevated in office but better than last visit. Home readings actually normal range - will continue current regimen but split timing of pillls - to lisinopril in AM and amlodipine in PM. Reassess at f/u visit.  No orders of the defined types were placed in this encounter.  No orders of the defined types were placed in this encounter.   Follow up plan: No follow-ups on file.  [name], MD

## 2018-11-26 ENCOUNTER — Encounter: Payer: Self-pay | Admitting: Family Medicine

## 2018-12-02 MED ORDER — AZITHROMYCIN 250 MG PO TABS: 500.0000 mg | ORAL_TABLET | Freq: Once | ORAL | 0 refills | Status: AC

## 2018-12-14 ENCOUNTER — Encounter: Payer: Self-pay | Admitting: Family Medicine

## 2018-12-17 ENCOUNTER — Ambulatory Visit (INDEPENDENT_AMBULATORY_CARE_PROVIDER_SITE_OTHER): Payer: PPO | Admitting: Family Medicine

## 2018-12-17 ENCOUNTER — Other Ambulatory Visit: Payer: Self-pay

## 2018-12-17 ENCOUNTER — Encounter: Payer: Self-pay | Admitting: Family Medicine

## 2018-12-17 VITALS — BP 136/64 | HR 96 | Temp 97.6°F | Ht 63.75 in | Wt 150.2 lb

## 2018-12-17 DIAGNOSIS — M79669 Pain in unspecified lower leg: Secondary | ICD-10-CM

## 2018-12-17 DIAGNOSIS — E109 Type 1 diabetes mellitus without complications: Secondary | ICD-10-CM | POA: Diagnosis not present

## 2018-12-17 DIAGNOSIS — R1013 Epigastric pain: Secondary | ICD-10-CM

## 2018-12-17 DIAGNOSIS — I10 Essential (primary) hypertension: Secondary | ICD-10-CM

## 2018-12-17 DIAGNOSIS — H401132 Primary open-angle glaucoma, bilateral, moderate stage: Secondary | ICD-10-CM | POA: Diagnosis not present

## 2018-12-17 DIAGNOSIS — M7989 Other specified soft tissue disorders: Secondary | ICD-10-CM

## 2018-12-17 MED ORDER — PANTOPRAZOLE SODIUM 40 MG PO TBEC: 40.0000 mg | DELAYED_RELEASE_TABLET | Freq: Every day | ORAL | 1 refills | Status: DC

## 2018-12-17 MED ORDER — METOPROLOL SUCCINATE ER 25 MG PO TB24: 25.0000 mg | ORAL_TABLET | Freq: Every day | ORAL | 11 refills | Status: DC

## 2018-12-17 MED ORDER — AMLODIPINE BESYLATE 2.5 MG PO TABS: 2.5000 mg | ORAL_TABLET | Freq: Every day | ORAL | 11 refills | Status: DC

## 2018-12-17 NOTE — Assessment & Plan Note (Addendum)
BP well controlled however concern for amlodipine side effects - will decrease to 2.5mg  daily, add toprol XL 25mg  daily. Discussed timing of antihypertensives. Update if not improving with med changes. Update [place] in 2 wks with BP readings over mychart.

## 2018-12-17 NOTE — Telephone Encounter (Signed)
Seen today. 

## 2018-12-17 NOTE — Patient Instructions (Addendum)
Start pantoprazole (protonix) daily.  Separate aspirin and plavix timing of administration.  Decrease amlodipine (norvasc) to 2.5mg  daily - new dose at pharmacy. Take this at night time.  Start metoprolol XL 25mg  once daily.  Update me with blood pressures and symptoms in 2 weeks.

## 2018-12-17 NOTE — Assessment & Plan Note (Addendum)
Endorses burning epigastric discomfort with plavix and aspirin. Will split blood thinners, recommend start protonix 40mg  daily for better GI protection.

## 2018-12-17 NOTE — Assessment & Plan Note (Signed)
L ankle swelling and now with sharp R lower leg pains. Overall benign exam - not consistent with DVT or cellulitis. Possible med side effect (amlodipine started last month). Will decrease amlodipine to 2.5mg  daily. Supportive care reviewed. See above.

## 2018-12-17 NOTE — Progress Notes (Signed)
BP 136/64 (BP Location: Left Arm, Patient Position: Sitting, Cuff Size: Normal)   Pulse 96   Temp 97.6 F (36.4 C) (Oral)   Ht 5' 3.75" (1.619 m)   Wt 150 lb 4 oz (68.2 kg)   SpO2 98%   BMI 25.99 kg/m    CC: leg swelling Subjective:    Patient ID: Cassandra Benson, female    DOB: 1941/04/28, 78 y.o.   MRN: [id number]  HPI: Cassandra Benson is a 78 y.o. female presenting on 12/17/2018 for Joint Swelling (C/o left ankle swelling and is warm to touch. Noticed 1.5 wks ago. ); Leg Pain (C/o right lower leg pain. Started yesterday. ); and Discuss Medication (Wants to discuss Plavix. )    1.5 wk h/o L ankle swelling down to toes. Noticed some right leg pain over last few days. No redness or warmth, no recent prolonged travel. She is elevating legs. 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   Endorses intermittent raynaud's.   Regularly taking amlodipine 5mg  and lisinopril 10mg  daily. Amlodipine started 10/2018.   Brings BP log - 110-130/60-70s, HR 70-100s. No diet change.  cbg's elevated.  Lab Results  Component Value Date   HGBA1C 8.0 (A) 11/06/2018   Notes plavix causes stomach upset. She also takes aspirin 81mg  daily.  She takes protonix 40mg  daily.  Not taking aleve.      Relevant past medical, surgical, family and social history reviewed and updated as indicated. Interim medical history since our last visit reviewed. Allergies and medications reviewed and updated. Outpatient Medications Prior to Visit  Medication Sig Dispense Refill  . aspirin EC 81 MG tablet Take 81 mg by mouth daily.    . BD ULTRA-FINE LANCETS lancets Use as instructed upto 6 times daily 600 each 2  . benzocaine (ORAJEL) 10 % mucosal gel Use as directed 1 application in the mouth or throat 4 (four) times daily as needed for mouth pain.    . chlorpheniramine-HYDROcodone (TUSSIONEX PENNKINETIC ER) 10-8 MG/5ML SUER Take 5 mLs by mouth at bedtime as needed for cough (sedation precautions). 100 mL 0  . clopidogrel (PLAVIX) 75  MG tablet Take 1 tablet (75 mg total) by mouth daily. 30 tablet 6  . diclofenac sodium (VOLTAREN) 1 % GEL Apply 1 application topically 3 (three) times daily. 1 Tube 1  . glucagon (GLUCAGON EMERGENCY) 1 MG injection Inject 1 mg into the muscle once as needed for up to 1 dose. 1 each 12  . glucose blood (CONTOUR NEXT TEST) test strip Use as instructed to check 4 times daily 400 each 5  . insulin aspart (NOVOLOG) 100 UNIT/ML FlexPen Inject 6-10 Units into the skin 3 (three) times daily with meals. 15 mL 11  . Insulin Degludec (TRESIBA FLEXTOUCH) 200 UNIT/ML SOPN Inject 18 Units into the skin daily. 3 pen 3  . Insulin Pen Needle 32G X 4 MM MISC Use 4-5x a day 300 each 3  . Krill Oil 500 MG CAPS Take 2 capsules (1,000 mg total) by mouth daily. (Patient taking differently: Take 500 mg by mouth daily. )    . lisinopril (PRINIVIL,ZESTRIL) 10 MG tablet Take 10 mg by mouth daily.    Marland Kitchen neomycin-polymyxin-dexamethasone (MAXITROL) 0.1 % ophthalmic suspension Place 1 drop into both eyes daily.    . nitroGLYCERIN (NITROSTAT) 0.4 MG SL tablet Place 1 tablet (0.4 mg total) under the tongue every 5 (five) minutes as needed for chest pain. 30 tablet 1  . rosuvastatin (CRESTOR) 10 MG tablet Take 1  tablet (10 mg total) by mouth every other day. (Patient taking differently: Take 10 mg by mouth daily. ) 30 tablet 6  . SYNTHROID 75 MCG tablet TAKE 1 TABLET BY MOUTH ONCE DAILY FOR 6 DAYS AND 1 AND 1/2 TABLETS ON THE 7TH DAY (Patient taking differently: Take 37.5-75 mcg by mouth See admin instructions. Take 37.5 mcg by mouth daily on Sunday. Take 75 mcg by mouth daily on all other days.) 90 tablet 3  . traMADol (ULTRAM) 50 MG tablet Take 0.5-1 tablets (25-50 mg total) by mouth 2 (two) times daily as needed for moderate pain. 8 tablet 0  . amLODipine (NORVASC) 5 MG tablet Take 1 tablet (5 mg total) by mouth daily. 30 tablet 3  . naproxen sodium (ALEVE) 220 MG tablet Take 220 mg by mouth 2 (two) times daily as needed (for  osteoarthritis).     . pantoprazole (PROTONIX) 40 MG tablet Take 1 tablet (40 mg total) by mouth daily. (Patient taking differently: Take 40 mg by mouth daily. Takes prn heart burn) 30 tablet 3   No facility-administered medications prior to visit.      Per HPI unless specifically indicated in ROS section below Review of Systems Objective:    BP 136/64 (BP Location: Left Arm, Patient Position: Sitting, Cuff Size: Normal)   Pulse 96   Temp 97.6 F (36.4 C) (Oral)   Ht 5' 3.75" (1.619 m)   Wt 150 lb 4 oz (68.2 kg)   SpO2 98%   BMI 25.99 kg/m   Wt Readings from Last 3 Encounters:  12/17/18 150 lb 4 oz (68.2 kg)  11/21/18 147 lb 3 oz (66.8 kg)  11/06/18 149 lb (67.6 kg)    Physical Exam Vitals signs and nursing note reviewed.  Constitutional:      Appearance: Normal appearance. She is not ill-appearing.  HENT:     Mouth/Throat:     Mouth: Mucous membranes are moist.  Eyes:     Extraocular Movements: Extraocular movements intact.     Pupils: Pupils are equal, round, and reactive to light.  Musculoskeletal: Normal range of motion.        General: No tenderness.     Right lower leg: No edema.     Left lower leg: No edema.     Comments: 2+ DP/PT bilaterally No calf pain or palpable cords  Skin:    General: Skin is warm and dry.     Findings: No rash.  Neurological:     Mental Status: She is alert.  Psychiatric:        Mood and Affect: Mood normal.        Behavior: Behavior normal.       Results for orders placed or performed in visit on 11/07/18  HM DIABETES EYE EXAM  Result Value Ref Range   HM Diabetic Eye Exam No Retinopathy No Retinopathy   Assessment & Plan:   Problem List Items Addressed This Visit    Pain and swelling of lower leg - Primary    L ankle swelling and now with sharp R lower leg pains. Overall benign exam - not consistent with DVT or cellulitis. Possible med side effect (amlodipine started last month). Will decrease amlodipine to 2.5mg  daily.  Supportive care reviewed. See above.       Essential hypertension    BP well controlled however concern for amlodipine side effects - will decrease to 2.5mg  daily, add toprol XL 25mg  daily. Discussed timing of antihypertensives. Update if not improving with  med changes. Update [place] in 2 wks with BP readings over mychart.      Relevant Medications   amLODipine (NORVASC) 2.5 MG tablet   metoprolol succinate (TOPROL-XL) 25 MG 24 hr tablet   Epigastric discomfort    Endorses burning epigastric discomfort with plavix and aspirin. Will split blood thinners, recommend start protonix 40mg  daily for better GI protection.           Meds ordered this encounter  Medications  . pantoprazole (PROTONIX) 40 MG tablet    Sig: Take 1 tablet (40 mg total) by mouth daily.    Dispense:  90 tablet    Refill:  1  . amLODipine (NORVASC) 2.5 MG tablet    Sig: Take 1 tablet (2.5 mg total) by mouth daily.    Dispense:  30 tablet    Refill:  11  . metoprolol succinate (TOPROL-XL) 25 MG 24 hr tablet    Sig: Take 1 tablet (25 mg total) by mouth daily.    Dispense:  30 tablet    Refill:  11   No orders of the defined types were placed in this encounter.   Patient Instructions  Start pantoprazole (protonix) daily.  Separate aspirin and plavix timing of administration.  Decrease amlodipine (norvasc) to 2.5mg  daily - new dose at pharmacy. Take this at night time.  Start metoprolol XL 25mg  once daily.  Update me with blood pressures and symptoms in 2 weeks.    Follow up plan: No follow-ups on file.  [name], MD

## 2018-12-17 NOTE — Telephone Encounter (Signed)
Pt seen in office this morning.  ?

## 2018-12-25 ENCOUNTER — Telehealth: Payer: Self-pay | Admitting: Family Medicine

## 2018-12-25 NOTE — Telephone Encounter (Signed)
Spoke with pt she schedule [facility] appointment 4/6 @ 3.  She wanted to let you know her left ankle was still swelling.  It swells later in the day it starts after she has been up for a while.  She wanted to know if you wanted her to send you [employer].

## 2018-12-26 NOTE — Telephone Encounter (Signed)
Spoke with pt relaying Dr. G's message. Pt verbalizes understanding.  

## 2018-12-26 NOTE — Telephone Encounter (Signed)
Yes plz send me some pictures to review.  We may have to fully come off amlodipine - but will discuss this at [place] [place] next week taking into account how BP readings are doing.

## 2018-12-27 ENCOUNTER — Telehealth: Payer: Self-pay

## 2018-12-27 NOTE — Telephone Encounter (Signed)
Forms for [employer] pump supplies filled out, signed by Dr. Cruzita Lederer and faxed to [facility]  with confirmation.

## 2018-12-28 ENCOUNTER — Telehealth: Payer: Self-pay | Admitting: Internal Medicine

## 2018-12-28 NOTE — Telephone Encounter (Signed)
CARA with [employer] is calling in regards to receiving labs from our office so they are able to get the patient insulin pump supplies to her  PHONE- [phone] EX [phone]

## 2018-12-30 ENCOUNTER — Encounter: Payer: Self-pay | Admitting: Family Medicine

## 2018-12-31 ENCOUNTER — Ambulatory Visit (INDEPENDENT_AMBULATORY_CARE_PROVIDER_SITE_OTHER): Payer: PPO | Admitting: Family Medicine

## 2018-12-31 ENCOUNTER — Ambulatory Visit: Payer: PPO | Admitting: Family Medicine

## 2018-12-31 ENCOUNTER — Encounter: Payer: Self-pay | Admitting: Family Medicine

## 2018-12-31 ENCOUNTER — Other Ambulatory Visit: Payer: Self-pay

## 2018-12-31 VITALS — BP 110/58 | HR 70 | Temp 97.5°F | Ht 63.75 in | Wt 144.0 lb

## 2018-12-31 DIAGNOSIS — I10 Essential (primary) hypertension: Secondary | ICD-10-CM | POA: Diagnosis not present

## 2018-12-31 DIAGNOSIS — M79669 Pain in unspecified lower leg: Secondary | ICD-10-CM | POA: Diagnosis not present

## 2018-12-31 DIAGNOSIS — M7989 Other specified soft tissue disorders: Secondary | ICD-10-CM | POA: Diagnosis not present

## 2018-12-31 NOTE — Telephone Encounter (Signed)
Reviewed at [facility] today

## 2018-12-31 NOTE — Telephone Encounter (Signed)
Reviewed at virtual visit today.

## 2018-12-31 NOTE — Progress Notes (Signed)
Virtual visit attempted through WebEx.  Patient location: home Provider location: [place] at [facility], office If any vitals were documented below, they were collected by patient at home unless specified below.    Interactive audio and video telecommunications were attempted between myself and YOSSELIN ZOELLER, however failed due to patient having technical difficulties. We continued and completed visit with audio only through webEx.  Time: 3:01pm - 3:17pm   BP (!) 110/58 (BP Location: Left Arm, Patient Position: Sitting, Cuff Size: Normal)    Pulse 70    Temp (!) 97.5 F (36.4 C) (Oral)    Ht 5' 3.75" (1.619 m)    Wt 144 lb (65.3 kg)    BMI 24.91 kg/m    CC: f/u leg swelling Subjective:    Patient ID: [name], female    DOB: 04/20/1941, 78 y.o.   MRN: [id number]  HPI: CATIE CHIAO is a 78 y.o. female presenting on 12/31/2018 for Follow-up (3-4 mo f/u.)    See prior note for details.  Seen here 2 wks ago with leg pain and swelling. In evenings left leg gets warm. Ongoing leg pain on right.   BP this morning 110/58, HR 70.  BP log through mychart largely well controlled.   Last visit we decreased amlodipine to 2.5mg  daily. She does continue lisinopril 10mg  and toprol XL 25mg  daily.      Relevant past medical, surgical, family and social history reviewed and updated as indicated. Interim medical history since our last visit reviewed. Allergies and medications reviewed and updated. Outpatient Medications Prior to Visit  Medication Sig Dispense Refill   aspirin EC 81 MG tablet Take 81 mg by mouth daily.     BD ULTRA-FINE LANCETS lancets Use as instructed upto 6 times daily 600 each 2   benzocaine (ORAJEL) 10 % mucosal gel Use as directed 1 application in the mouth or throat 4 (four) times daily as needed for mouth pain.     chlorpheniramine-HYDROcodone (TUSSIONEX PENNKINETIC ER) 10-8 MG/5ML SUER Take 5 mLs by mouth at bedtime as needed for cough (sedation  precautions). 100 mL 0   clopidogrel (PLAVIX) 75 MG tablet Take 1 tablet (75 mg total) by mouth daily. 30 tablet 6   diclofenac sodium (VOLTAREN) 1 % GEL Apply 1 application topically 3 (three) times daily. 1 Tube 1   glucagon (GLUCAGON EMERGENCY) 1 MG injection Inject 1 mg into the muscle once as needed for up to 1 dose. 1 each 12   glucose blood (CONTOUR NEXT TEST) test strip Use as instructed to check 4 times daily 400 each 5   insulin aspart (NOVOLOG) 100 UNIT/ML FlexPen Inject 6-10 Units into the skin 3 (three) times daily with meals. 15 mL 11   Insulin Degludec (TRESIBA FLEXTOUCH) 200 UNIT/ML SOPN Inject 18 Units into the skin daily. 3 pen 3   Insulin Pen Needle 32G X 4 MM MISC Use 4-5x a day 300 each 3   Krill Oil 500 MG CAPS Take 2 capsules (1,000 mg total) by mouth daily. (Patient taking differently: Take 500 mg by mouth daily. )     lisinopril (PRINIVIL,ZESTRIL) 10 MG tablet Take 10 mg by mouth daily.     metoprolol succinate (TOPROL-XL) 25 MG 24 hr tablet Take 1 tablet (25 mg total) by mouth daily. 30 tablet 11   neomycin-polymyxin-dexamethasone (MAXITROL) 0.1 % ophthalmic suspension Place 1 drop into both eyes daily.     nitroGLYCERIN (NITROSTAT) 0.4 MG SL tablet Place 1 tablet (0.4  mg total) under the tongue every 5 (five) minutes as needed for chest pain. 30 tablet 1   pantoprazole (PROTONIX) 40 MG tablet Take 1 tablet (40 mg total) by mouth daily. 90 tablet 1   rosuvastatin (CRESTOR) 10 MG tablet Take 1 tablet (10 mg total) by mouth every other day. (Patient taking differently: Take 10 mg by mouth daily. ) 30 tablet 6   SYNTHROID 75 MCG tablet TAKE 1 TABLET BY MOUTH ONCE DAILY FOR 6 DAYS AND 1 AND 1/2 TABLETS ON THE 7TH DAY (Patient taking differently: Take 37.5-75 mcg by mouth See admin instructions. Take 37.5 mcg by mouth daily on Sunday. Take 75 mcg by mouth daily on all other days.) 90 tablet 3   traMADol (ULTRAM) 50 MG tablet Take 0.5-1 tablets (25-50 mg total) by  mouth 2 (two) times daily as needed for moderate pain. 8 tablet 0   amLODipine (NORVASC) 2.5 MG tablet Take 1 tablet (2.5 mg total) by mouth daily. 30 tablet 11   No facility-administered medications prior to visit.      Per HPI unless specifically indicated in ROS section below Review of Systems Objective:    BP (!) 110/58 (BP Location: Left Arm, Patient Position: Sitting, Cuff Size: Normal)    Pulse 70    Temp (!) 97.5 F (36.4 C) (Oral)    Ht 5' 3.75" (1.619 m)    Wt 144 lb (65.3 kg)    BMI 24.91 kg/m   Wt Readings from Last 3 Encounters:  12/31/18 144 lb (65.3 kg)  12/17/18 150 lb 4 oz (68.2 kg)  11/21/18 147 lb 3 oz (66.8 kg)     Physical exam: Gen: alert, NAD, not ill appearing Pulm: speaks in complete sentences without increased work of breathing Psych: normal mood, normal thought content  MSK: mild pedal edema bilaterally - see recent mychart pictures     Lab Results  Component Value Date   CREATININE 0.97 10/07/2018   BUN 18 10/07/2018   NA 134 (L) 10/07/2018   K 4.2 10/07/2018   CL 101 10/07/2018   CO2 22 10/07/2018    Assessment & Plan:   Problem List Items Addressed This Visit    Pain and swelling of lower leg - Primary    Ongoing bilaterally, some better than last visit here. Will fully come off amlodipine and then reassess in 1-2 wks.  Reviewed low sodium diet, good water intake. Discussed possible compression stockings or adding water pill as next step.  Pt agrees with plan.       Essential hypertension    BP remains well controlled today. Overall better with addition of toprol XL. Will stop amlodipine and monitor BP readings for now, update with log in 1-2 wks.           No orders of the defined types were placed in this encounter.  No orders of the defined types were placed in this encounter.   Follow up plan: No follow-ups on file.  [name], MD

## 2018-12-31 NOTE — Assessment & Plan Note (Signed)
Ongoing bilaterally, some better than last visit here. Will fully come off amlodipine and then reassess in 1-2 wks.  Reviewed low sodium diet, good water intake. Discussed possible compression stockings or adding water pill as next step.  Pt agrees with plan.

## 2018-12-31 NOTE — Assessment & Plan Note (Signed)
BP remains well controlled today. Overall better with addition of toprol XL. Will stop amlodipine and monitor BP readings for now, update with log in 1-2 wks.

## 2019-01-01 NOTE — Telephone Encounter (Signed)
We received paperwork from [employer], I did fax it but will send it again.

## 2019-01-02 IMAGING — CR DG CHEST 2V
2 series · 2 of 2 positions shown · non-contrast
Comparison: Radiographs and CT 05/26/2015

CLINICAL DATA: Right lower chest pain.  Dizziness and weakness.

EXAM:
CHEST  2 VIEW

[chest pa]
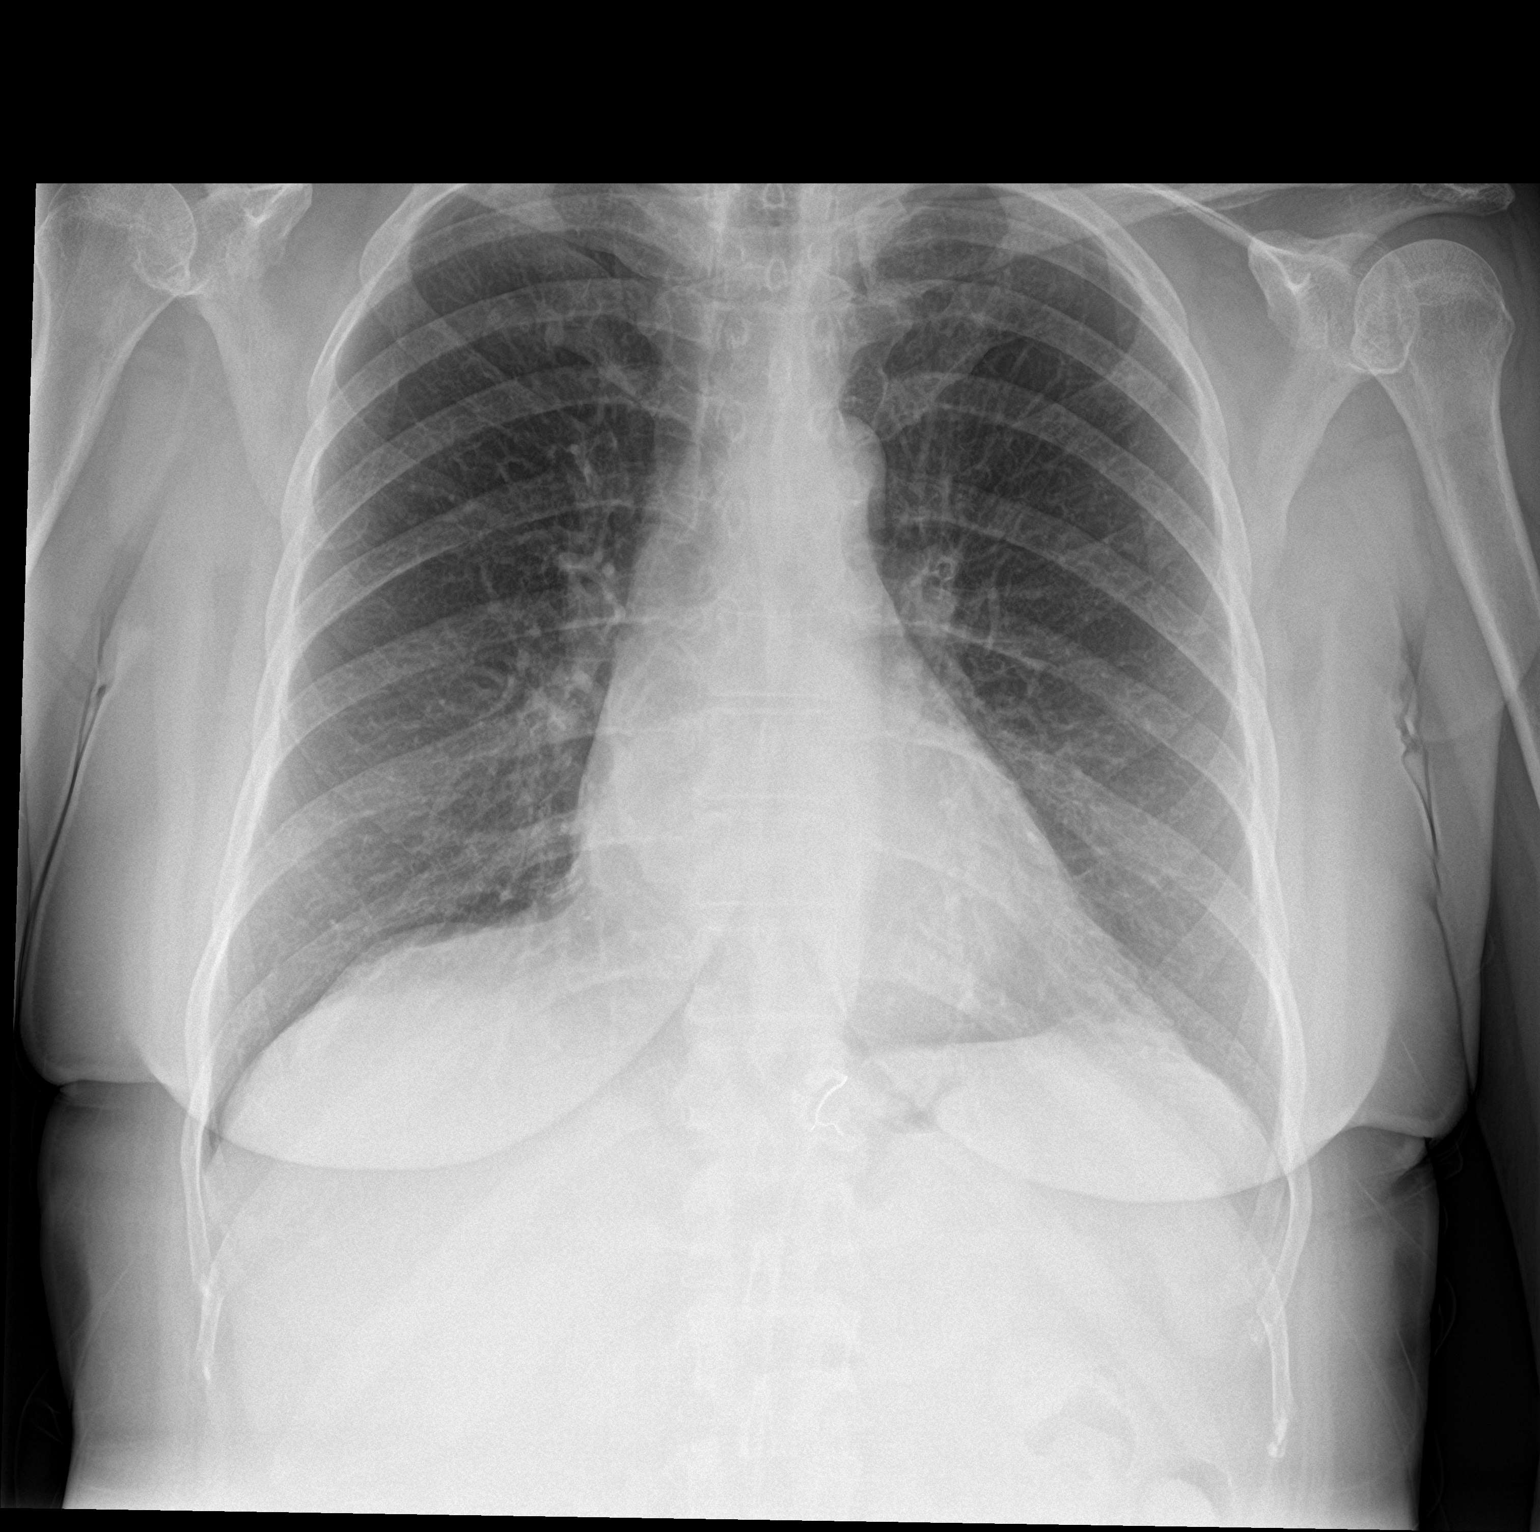

[chest lat]
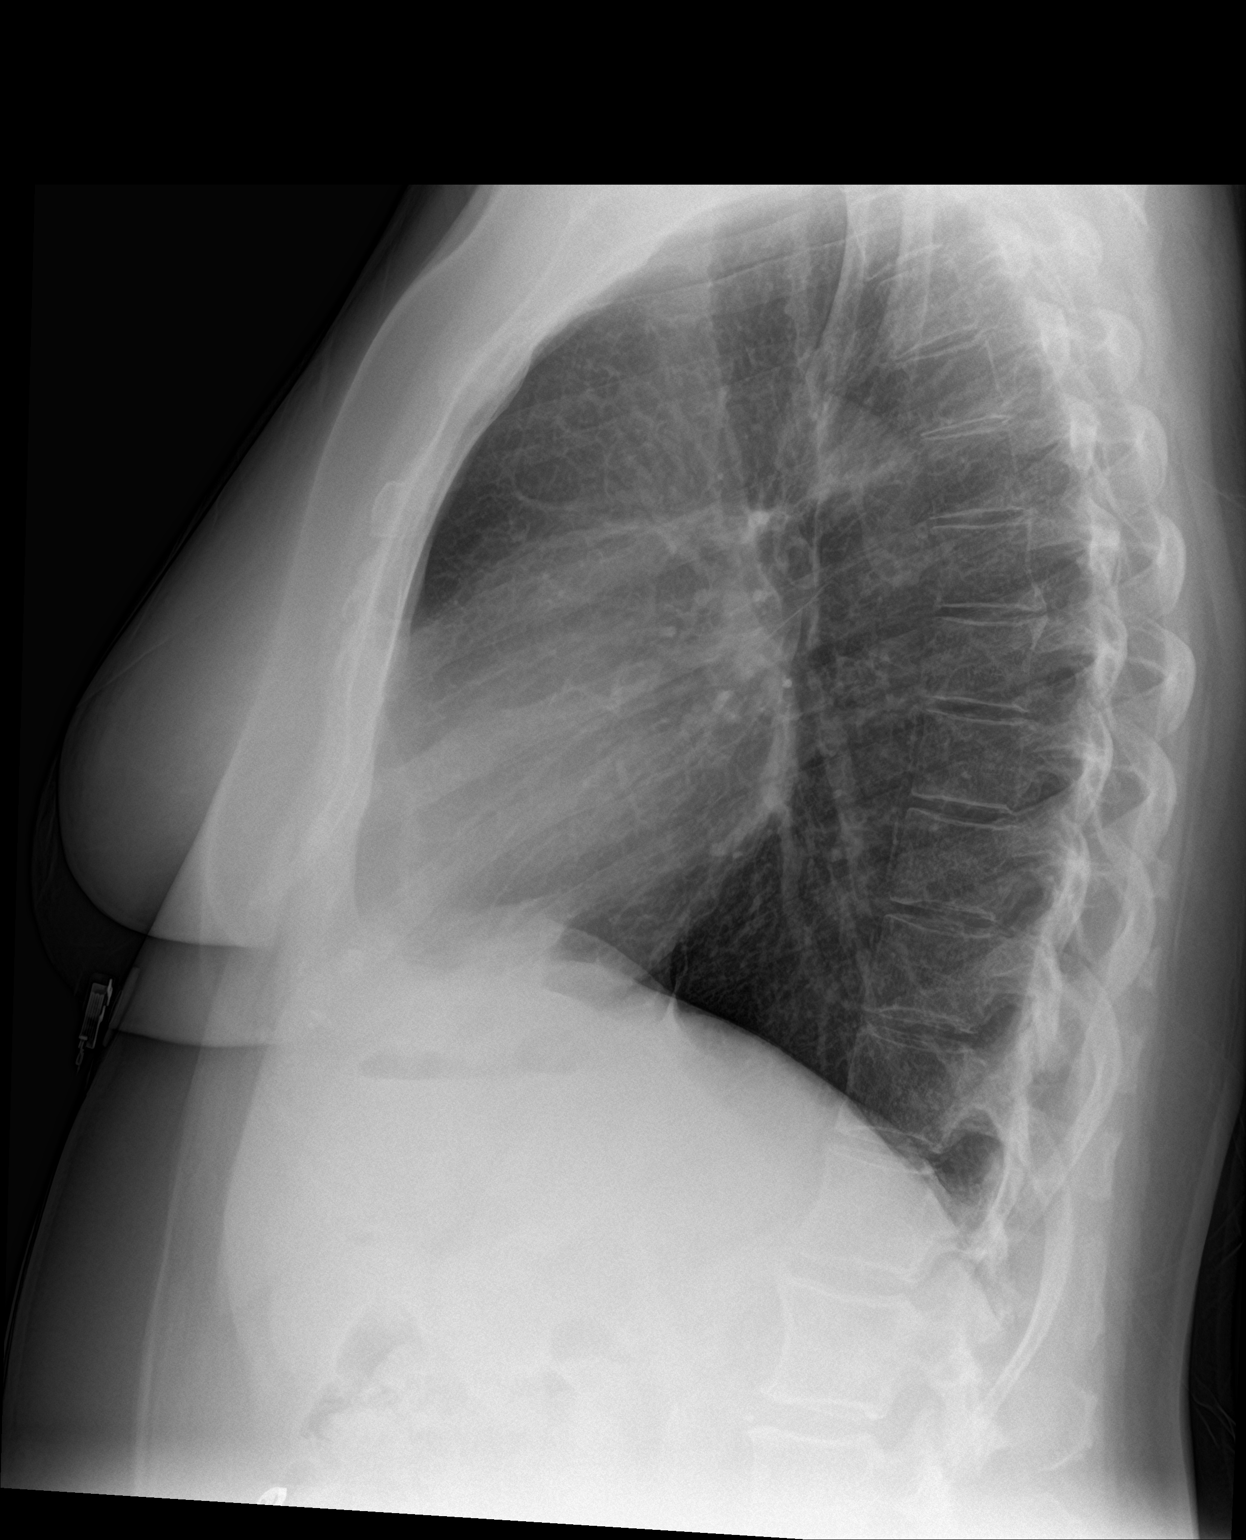

[2 of 2 positions shown; findings below may reference images not displayed]

FINDINGS: The cardiomediastinal contours are normal. The lungs are clear.
Pulmonary vasculature is normal. No consolidation, pleural effusion,
or pneumothorax. No acute osseous abnormalities are seen.
IMPRESSION: No acute pulmonary process.

## 2019-01-07 ENCOUNTER — Encounter: Payer: Self-pay | Admitting: Family Medicine

## 2019-01-07 DIAGNOSIS — I7300 Raynaud's syndrome without gangrene: Secondary | ICD-10-CM

## 2019-01-07 DIAGNOSIS — M797 Fibromyalgia: Secondary | ICD-10-CM

## 2019-01-07 DIAGNOSIS — R768 Other specified abnormal immunological findings in serum: Secondary | ICD-10-CM

## 2019-01-10 NOTE — Addendum Note (Signed)
Addended by: [name] on: 01/10/2019 12:15 PM   Modules accepted: Orders

## 2019-02-05 ENCOUNTER — Other Ambulatory Visit: Payer: Self-pay | Admitting: Family Medicine

## 2019-02-11 ENCOUNTER — Encounter: Payer: Self-pay | Admitting: Family Medicine

## 2019-02-15 ENCOUNTER — Encounter: Payer: Self-pay | Admitting: Family Medicine

## 2019-02-15 DIAGNOSIS — M3500 Sicca syndrome, unspecified: Secondary | ICD-10-CM

## 2019-02-15 DIAGNOSIS — M797 Fibromyalgia: Secondary | ICD-10-CM | POA: Diagnosis not present

## 2019-02-15 DIAGNOSIS — I7300 Raynaud's syndrome without gangrene: Secondary | ICD-10-CM | POA: Diagnosis not present

## 2019-02-15 DIAGNOSIS — R768 Other specified abnormal immunological findings in serum: Secondary | ICD-10-CM | POA: Diagnosis not present

## 2019-02-19 MED ORDER — PLAVIX 75 MG PO TABS: 75.0000 mg | ORAL_TABLET | Freq: Every day | ORAL | 1 refills | Status: DC

## 2019-02-20 ENCOUNTER — Telehealth: Payer: Self-pay

## 2019-02-20 NOTE — Telephone Encounter (Signed)
Cassandra Benson with [name] left a voicemail stating that she is working on a prior authorization for [employer] part D. Cassandra Benson is requesting a call back to get some clinical information.Telephone number [phone] Case # [id number]

## 2019-02-20 NOTE — Telephone Encounter (Signed)
Filled and in Lisa's box 

## 2019-02-20 NOTE — Telephone Encounter (Signed)
Received faxed PA form for Plavix 75 mg tab from [facility].  Placed form in Dr. Synthia Innocent box.

## 2019-02-21 NOTE — Telephone Encounter (Signed)
Received faxed PA approval through 09/26/2019.

## 2019-02-21 NOTE — Telephone Encounter (Signed)
Spoke with Flora, of [employer], notifying them the PA form was faxed this morning. Confirmed they received it and PA is in progress.  Decision pending.

## 2019-02-26 DIAGNOSIS — M3500 Sicca syndrome, unspecified: Secondary | ICD-10-CM | POA: Insufficient documentation

## 2019-03-01 ENCOUNTER — Ambulatory Visit
Admission: RE | Admit: 2019-03-01 | Discharge: 2019-03-01 | Disposition: A | Discharge status: Home / Self Care | Payer: PPO | Source: Ambulatory Visit | Attending: Family Medicine | Admitting: Family Medicine

## 2019-03-01 ENCOUNTER — Other Ambulatory Visit: Payer: Self-pay

## 2019-03-01 ENCOUNTER — Encounter: Payer: Self-pay | Admitting: Family Medicine

## 2019-03-01 DIAGNOSIS — Z1231 Encounter for screening mammogram for malignant neoplasm of breast: Secondary | ICD-10-CM | POA: Diagnosis not present

## 2019-03-01 LAB — HM MAMMOGRAPHY

## 2019-03-06 ENCOUNTER — Encounter: Payer: Self-pay | Admitting: Family Medicine

## 2019-03-07 ENCOUNTER — Other Ambulatory Visit: Payer: Self-pay

## 2019-03-07 DIAGNOSIS — R768 Other specified abnormal immunological findings in serum: Secondary | ICD-10-CM | POA: Diagnosis not present

## 2019-03-07 DIAGNOSIS — I7300 Raynaud's syndrome without gangrene: Secondary | ICD-10-CM | POA: Diagnosis not present

## 2019-03-07 DIAGNOSIS — R5382 Chronic fatigue, unspecified: Secondary | ICD-10-CM | POA: Diagnosis not present

## 2019-03-07 DIAGNOSIS — M797 Fibromyalgia: Secondary | ICD-10-CM | POA: Diagnosis not present

## 2019-03-07 DIAGNOSIS — M3500 Sicca syndrome, unspecified: Secondary | ICD-10-CM | POA: Diagnosis not present

## 2019-03-07 DIAGNOSIS — Z13820 Encounter for screening for osteoporosis: Secondary | ICD-10-CM | POA: Diagnosis not present

## 2019-03-07 NOTE — Telephone Encounter (Signed)
plz call and offer office visit this week, virtual through mychart if pt willing

## 2019-03-07 NOTE — Telephone Encounter (Signed)
Tried calling patient at both numbers. One number wasn't active and the other one did not have a voicemail to leave a message. Please advise.

## 2019-03-07 NOTE — Telephone Encounter (Signed)
Noted  

## 2019-03-08 NOTE — Telephone Encounter (Signed)
Spoke with pt. Scheduled phn/facetime visit on 03/11/19 at 3:30 PM.

## 2019-03-11 ENCOUNTER — Other Ambulatory Visit: Payer: Self-pay

## 2019-03-11 ENCOUNTER — Encounter: Payer: Self-pay | Admitting: Family Medicine

## 2019-03-11 ENCOUNTER — Encounter: Payer: Self-pay | Admitting: Internal Medicine

## 2019-03-11 ENCOUNTER — Ambulatory Visit (INDEPENDENT_AMBULATORY_CARE_PROVIDER_SITE_OTHER): Payer: PPO | Admitting: Family Medicine

## 2019-03-11 ENCOUNTER — Ambulatory Visit: Payer: PPO | Admitting: Internal Medicine

## 2019-03-11 VITALS — BP 119/77 | HR 78 | Temp 97.7°F | Ht 63.75 in | Wt 150.0 lb

## 2019-03-11 VITALS — BP 130/72 | HR 76 | Ht 63.75 in | Wt 151.0 lb

## 2019-03-11 DIAGNOSIS — I739 Peripheral vascular disease, unspecified: Secondary | ICD-10-CM | POA: Diagnosis not present

## 2019-03-11 DIAGNOSIS — E038 Other specified hypothyroidism: Secondary | ICD-10-CM | POA: Diagnosis not present

## 2019-03-11 DIAGNOSIS — M797 Fibromyalgia: Secondary | ICD-10-CM | POA: Diagnosis not present

## 2019-03-11 DIAGNOSIS — E139 Other specified diabetes mellitus without complications: Secondary | ICD-10-CM

## 2019-03-11 DIAGNOSIS — I10 Essential (primary) hypertension: Secondary | ICD-10-CM

## 2019-03-11 DIAGNOSIS — E063 Autoimmune thyroiditis: Secondary | ICD-10-CM | POA: Diagnosis not present

## 2019-03-11 DIAGNOSIS — R0789 Other chest pain: Secondary | ICD-10-CM | POA: Diagnosis not present

## 2019-03-11 DIAGNOSIS — I6522 Occlusion and stenosis of left carotid artery: Secondary | ICD-10-CM

## 2019-03-11 DIAGNOSIS — M3500 Sicca syndrome, unspecified: Secondary | ICD-10-CM | POA: Diagnosis not present

## 2019-03-11 LAB — POCT GLYCOSYLATED HEMOGLOBIN (HGB A1C): Hemoglobin A1C: 8.4 % — AB (ref 4.0–5.6)

## 2019-03-11 NOTE — Patient Instructions (Addendum)
Please continue: - Tresiba 18 units in am  Change: - Novolog to: ICR: 1:8 (starches)-1:10 (other foods) Target: 130 Insulin sensitivity factor: 20  If you correct sugars at bedtime, only correct >200 and only give 2-3 units.  Please return in 3 months with your sugar log.

## 2019-03-11 NOTE — Progress Notes (Signed)
Virtual visit attempted through [employer]. Due to national recommendations of social distancing due to COVID-19, a virtual visit is felt to be most appropriate for this patient at this time. Reviewed limitations of a virtual visit. Interactive audio and video telecommunications were attempted between myself and Cassandra Benson, however failed due to patient having technical difficulties. We continued and completed visit with audio only.  Time: 4:00pm - 4:35pm   Patient location: home Provider location: [place] at [facility], office If any vitals were documented, they were collected by patient at home unless specified below.    BP 119/77   Pulse 78   Temp 97.7 F (36.5 C) (Oral)   Ht 5' 3.75" (1.619 m)   Wt 150 lb (68 kg)   BMI 25.95 kg/m    CC: discuss concerns  Subjective:    Patient ID: Cassandra Benson, female    DOB: July 30, 1941, 78 y.o.   MRN: [id number]  HPI: TEHILA SOKOLOW is a 78 y.o. female presenting on 03/11/2019 for Elevated BS (Wants to discuss recent elevated BS readings.) and Muscle Pain (C/o increased allover muscule pain. )   Recent endo and rheum notes reviewed.   DM - off insulin pump due to difficulty obtaining supplies. She thinks melatonin has helped sleep but caused hyperglycemia.  Also worried generic clopidogrel causing hyperglycemia.   Rheum - ANA positive. Known Raynaud's and Sjogren's syndrome without extraglandular involvement. Patient noticing increased body pains throughout body. She had a rough week last week.   Noticing increasing chest discomfort described as pressure "like cinderblock on my chest" - without new chest wall injury or known muscle strain.   She notes increasing pain in back, concerned because her sister had metastatic breast cancer that presented with back pain.   Received request from [facility] for medical clearance for cleaning, fillings, local anesthesia.      Relevant past medical, surgical, family and social history  reviewed and updated as indicated. Interim medical history since our last visit reviewed. Allergies and medications reviewed and updated. Outpatient Medications Prior to Visit  Medication Sig Dispense Refill  . aspirin EC 81 MG tablet Take 81 mg by mouth daily.    . BD ULTRA-FINE LANCETS lancets Use as instructed upto 6 times daily 600 each 2  . benzocaine (ORAJEL) 10 % mucosal gel Use as directed 1 application in the mouth or throat 4 (four) times daily as needed for mouth pain.    . chlorpheniramine-HYDROcodone (TUSSIONEX PENNKINETIC ER) 10-8 MG/5ML SUER Take 5 mLs by mouth at bedtime as needed for cough (sedation precautions). 100 mL 0  . clopidogrel (PLAVIX) 75 MG tablet Take 1 tablet (75 mg total) by mouth daily. 30 tablet 6  . diclofenac sodium (VOLTAREN) 1 % GEL Apply 1 application topically 3 (three) times daily. 1 Tube 1  . glucagon (GLUCAGON EMERGENCY) 1 MG injection Inject 1 mg into the muscle once as needed for up to 1 dose. 1 each 12  . glucose blood (CONTOUR NEXT TEST) test strip Use as instructed to check 4 times daily 400 each 5  . insulin aspart (NOVOLOG) 100 UNIT/ML FlexPen Inject 6-10 Units into the skin 3 (three) times daily with meals. 15 mL 11  . Insulin Degludec (TRESIBA FLEXTOUCH) 200 UNIT/ML SOPN Inject 18 Units into the skin daily. 3 pen 3  . Insulin Pen Needle 32G X 4 MM MISC Use 4-5x a day 300 each 3  . Krill Oil 500 MG CAPS Take 2 capsules (1,000 mg  total) by mouth daily. (Patient taking differently: Take 500 mg by mouth daily. )    . lisinopril (ZESTRIL) 10 MG tablet TAKE 1 TABLET BY MOUTH ONCE DAILY 90 tablet 1  . metoprolol succinate (TOPROL-XL) 25 MG 24 hr tablet Take 1 tablet (25 mg total) by mouth daily. 30 tablet 11  . neomycin-polymyxin-dexamethasone (MAXITROL) 0.1 % ophthalmic suspension Place 1 drop into both eyes daily.    . nitroGLYCERIN (NITROSTAT) 0.4 MG SL tablet Place 1 tablet (0.4 mg total) under the tongue every 5 (five) minutes as needed for chest  pain. 30 tablet 1  . pantoprazole (PROTONIX) 40 MG tablet Take 1 tablet (40 mg total) by mouth daily. 90 tablet 1  . PLAVIX 75 MG tablet Take 1 tablet (75 mg total) by mouth daily. 90 tablet 1  . rosuvastatin (CRESTOR) 10 MG tablet Take 1 tablet (10 mg total) by mouth every other day. (Patient taking differently: Take 10 mg by mouth daily. ) 30 tablet 6  . SYNTHROID 75 MCG tablet TAKE 1 TABLET BY MOUTH ONCE DAILY FOR 6 DAYS AND 1 AND 1/2 TABLETS ON THE 7TH DAY (Patient taking differently: Take 37.5-75 mcg by mouth See admin instructions. Take 37.5 mcg by mouth daily on Sunday. Take 75 mcg by mouth daily on all other days.) 90 tablet 3  . traMADol (ULTRAM) 50 MG tablet Take 0.5-1 tablets (25-50 mg total) by mouth 2 (two) times daily as needed for moderate pain. 8 tablet 0   No facility-administered medications prior to visit.      Per HPI unless specifically indicated in ROS section below Review of Systems Objective:    BP 119/77   Pulse 78   Temp 97.7 F (36.5 C) (Oral)   Ht 5' 3.75" (1.619 m)   Wt 150 lb (68 kg)   BMI 25.95 kg/m   Wt Readings from Last 3 Encounters:  03/11/19 150 lb (68 kg)  03/11/19 151 lb (68.5 kg)  12/31/18 144 lb (65.3 kg)     Physical exam: Gen: alert, NAD, not ill appearing Pulm: speaks in complete sentences without increased work of breathing Psych: normal mood, normal thought content      Results for orders placed or performed in visit on 03/11/19  POCT glycosylated hemoglobin (Hb A1C)  Result Value Ref Range   Hemoglobin A1C 8.4 (A) 4.0 - 5.6 %   HbA1c POC (<> result, manual entry)     HbA1c, POC (prediabetic range)     HbA1c, POC (controlled diabetic range)     Assessment & Plan:   Problem List Items Addressed This Visit    Sjogren's syndrome without extraglandular involvement ([place])    Appreciate rheum care. Upcoming dental work - clearance provided.       PAD (peripheral artery disease) ([facility])   LADA (latent autoimmune diabetes in adults),  managed as type 1 ([place]) - Primary    Appreciate endo care. Recent deterioration in A1c. Insulin dosing adjustments have been made.       Fibromyalgia    Increasing myalgias, but pt endorses this is different (worse) than her previous FM flares.       Essential hypertension    Chronic, stable. Continue current regimen.       Chest pain    Has had reassuring cardiac evaluation previously, however with new chest pressure discomfort I did suggest she call to schedule sooner appointment with cardiology. Pt aware to seek urgent care if worsening chest pain.  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          No orders of the defined types were placed in this encounter.  No orders of the defined types were placed in this encounter.   I discussed the assessment and treatment plan with the patient. The patient was provided an opportunity to ask questions and all were answered. The patient agreed with the plan and demonstrated an understanding of the instructions. The patient was advised to call back or seek an in-person evaluation if the symptoms worsen or if the condition fails to improve as anticipated.  Follow up plan: Return in about 2 months (around 05/11/2019), or if symptoms worsen or fail to improve.  [name], MD

## 2019-03-11 NOTE — Progress Notes (Signed)
Patient ID: Cassandra Benson, female   DOB: 1941-07-04, 78 y.o.   MRN: [id number]   HPI: Cassandra Benson is a 78 y.o.-year-old female, returning for f/u for LADA, initially dx'ed in 1998 (78 y/o), started insulin at dx, started insulin pump in ~2008, uncontrolled, without complications and hypothyroidism. She previously saw endocrinology at [place] (Dr. Janese Banks) and Dr Howell Rucks. Last visit with me 4 months ago.  Last hemoglobin A1c was: Lab Results  Component Value Date   HGBA1C 8.0 (A) 11/06/2018   HGBA1C 8.1 (H) 06/11/2018   HGBA1C 7.7 (A) 02/28/2018   She was on insulin pump: - [employer] 723-started 09/2016 (changed 07/2017), without CGM.  She uses NovoLog in the pump.  She was using [employer] for supplies before, but she is now forced by insurance to use [employer].  She has a lot of problems getting supplies from them and at last visit she was contemplating coming off the pump because of this.  At this visit, she is off the insulin pump and on basal/bolus insulin regimen.  She she was previously on Glucophage ER d.a.w. but had to stop due to not being able to obtain it.  Generic metformin ER gave her diarrhea in the past.  Previous settings on the insulin pump: - basal rates: 12 am: 0.800 units/h 3 am: 0.875 9:30 am: 0.100 >> 0.500 5 pm: 1.500 6:30 pm: 1.300 11:30 pm: 1.800  - ICR:   12 am: 10 (8 if on prednisone) 6 pm: 8 (6 if on Prednisone) - target: 130-130 except 6-8 am and 6 pm-12 am: 100-100 - ISF: 35 - Insulin on Board: 4h - bolus wizard: On Total daily dose: 30 units per day (up to 40 units for prescription) - extended bolusing: Not using - changes infusion site: 5 to 6 days - Meter: Bayer Contour   Now on: - Tresiba U200 18 units in am - Novolog insulin to carb ratio 1:8 >> 1:45, insulin sensitivity factor 40 >> 8, target 130 >> 145 (involved, bolus parameters that she actually uses compared to the ones recommended at last visit)  Pt checks her sugars 3x a day: - am: 125,  139-147 175 >> 113-168, 181 >> 131-203 - 2h after b'fast:  107-148 >> n/c >> n/c - before lunch: 118-169 >> 127-286 >> 79, 120-200, 229 - 2h after lunch: 87-129 >> 120, 141 >> n/c - before dinner: 106-130 >> 136-209 >> 92-219 - 2h after dinner:  290, 392 >> 118-207, 247 >> 92-236 - bedtime: n/c >> 171 - nighttime:   92-141, 183 >> n/c >> 69 x 1 Lowest sugar was 43 >> .Marland KitchenMarland Kitchen 45 (at night) >> 90 >> 72 (at night); she has hypoglycemia awareness in the 80s.  No history of hypoglycemia admissions.  She does have a glucagon kit at home. Highest sugar was 400 on Prednisone >> 300s; no history of DKA admissions.  Pt's meals are: - Breakfast: protein drink + almond milk - Lunch: PB jelly sandwich or sandwich with ham or cream cheese sandwich - Dinner: salads  - Snacks: pretzels; pork tenderloin + veggies; chicken + tenderloin  -No CKD, last BUN/creatinine:  Lab Results  Component Value Date   BUN 18 10/07/2018   BUN 9 08/29/2018   CREATININE 0.97 10/07/2018   CREATININE 0.85 08/29/2018  On lisinopril. - + HL;  last set of lipids: Lab Results  Component Value Date   CHOL 155 08/29/2018   HDL 43.30 08/29/2018   LDLCALC 88 08/29/2018   LDLDIRECT 177.0  08/09/2016   TRIG 115.0 08/29/2018   CHOLHDL 4 08/29/2018  On Crestor 5, but she does have muscle cramps despite using co-Q10.   Praluent was not covered. - last eye exam was in 10/2018: No DR - no numbness and tingling in her feet.  She was admitted for CP + SOB 10/01/2017.  Cardiac events and PE were ruled out.  She is seeing cardiology (Dr. Rockey Situ).  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  Hypothyroidism: -Due to Hashimoto's thyroiditis -She has family history of Graves' disease mother -She was initially on Levoxyl 75 mcg daily 1 tab 6/7 days and 1.5 tabs 1/7 days, but she thought Levoxyl increased her lipid levels and caused hair loss, so we changed to Synthroid.   She feels much better on the brand-name.  She currently is on Synthroid d.a.w. 75 mcg 1 tab 6/7 days and 0.5 tabs 1/7 days, taken: - in am - fasting - at least 30 min from b'fast - no Ca, Fe, PPIs - + MVIs at night - not on Biotin  Review TFTs: Lab Results  Component Value Date   TSH 1.10 11/06/2018   TSH 0.09 (L) 08/29/2018   TSH 0.92 11/27/2017   TSH 0.30 (L) 10/11/2017   TSH 0.24 (L) 08/11/2017   TSH 0.58 01/04/2017   TSH 0.42 08/09/2016   TSH 1.060 01/11/2016   TSH 2.90 06/08/2015   TSH 0.62 01/29/2015   She also has SLE and Sjogren's syndrome -  Sees Dr. Milly Jakob >> sees another rheumatologist..  We checked her hormones in the setting of hirsutism and acne and Component     Latest Ref Rng & Units 09/02/2014 09/08/2014  Testosterone     3 - 41 ng/dL 59 (H)   Testosterone Free     0.0 - 4.2 pg/mL 2.1   FSH     mIU/mL  68.6  LH     mIU/mL  43.4  DHEA-SO4     7 - 177 ug/dL 49   Cortisol, Plasma     ug/dL  1.3 (Normal Dexamethasone suppression test)   Estradiol     pg/mL  24.0   Also: Component     Latest Ref Rng & Units 01/04/2017  Cortisol - AM     mcg/dL 17.6  C206 ACTH     6 - 50 pg/mL 15  No signs of adrenal insufficiency  ROS: Constitutional: no weight gain/no weight loss, + fatigue, no subjective hyperthermia, no subjective hypothermia Eyes: no blurry vision, no xerophthalmia ENT: no sore throat, no nodules palpated in neck, no dysphagia, no odynophagia, no hoarseness Cardiovascular: no CP/no SOB/no palpitations/no leg swelling Respiratory: no cough/no SOB/no wheezing Gastrointestinal: no N/no V/no D/no C/no acid reflux Musculoskeletal: + muscle aches/+ joint aches Skin: no rashes, + hair loss Neurological: no tremors/no numbness/no tingling/no dizziness  I reviewed pt's medications, allergies, PMH, social hx, family hx, and changes were documented in the history of present illness. Otherwise, unchanged from my initial visit note.  Past Medical  History:  Diagnosis Date  . Allergy   . ANA positive    positive ANA pattern 1 speckled  . Arthritis   . Carotid stenosis, asymptomatic 06/19/2015   3-24% RICA 40-10% LICA rpt 1 yr (10/7251)   . Dermatomyositis ([place])   . Diabetes mellitus without complication (HCC)    Type 1  . Family history of adverse reaction to anesthesia    brothr went into cardiac arrest  from anectine  . Fibromyalgia    prior PCP  . GERD (gastroesophageal reflux disease)    prior PCP  . Glaucoma    Narrow angle  . History of blood clots    DVT, in 20s, none since  . History of chicken pox   . History of diverticulitis   . History of pericarditis 1986   with hospitalization  . History of pneumonia 2014  . History of shingles   . History of UTI   . Hyperlipidemia   . Hypertension   . Hypothyroidism   . Mixed connective tissue disease ([place])   . PONV (postoperative nausea and vomiting)   . Raynaud's disease without gangrene   . Shoulder pain left   h/o RTC tendonitis and adhesive capsulitis  . Sjogren's syndrome ([place])   . Sleep apnea    prior PCP - no CPAP for about 10 yrs  . 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[place])   . Vitamin D deficiency    prior PCP   Past Surgical History:  Procedure Laterality Date  . ABDOMINAL HYSTERECTOMY  1978   fibroids and menorrhagia, ovaries remain  . ARTERY BIOPSY Right 04/06/2018   Procedure: BIOPSY TEMPORAL ARTERY RIGHT;  Surgeon: Beverly Gust, MD;  Location: [facility];  Service: ENT;  Laterality: Right;  Diabetic - insulin pump sleep apnea  . [facility] normal per patient  . COLONOSCOPY WITH ESOPHAGOGASTRODUODENOSCOPY (EGD)  03/2007   2 ulcers, benign polyp, rpt 5 yrs ([place], [place])  . PARTIAL HIP ARTHROPLASTY  2013   Right hip replacement  . TONSILLECTOMY AND ADENOIDECTOMY    . TRANSCAROTID ARTERY REVASCULARIZATION Left 07/23/2018   Procedure: TRANSCAROTID ARTERY REVASCULARIZATION;  Surgeon: Marty Heck,  MD;  Location: [facility];  Service: Vascular;  Laterality: Left;  . TUBAL LIGATION    . VAGINAL DELIVERY     x2, no complications   Social History   Social History  . Widowed   . Number of children: 2   Occupational History  Retired    Social History Main Topics  . Smoking status: Never Smoker  . Smokeless tobacco: Never Used  . Alcohol use No  . Drug use: No   Social History Narrative   Lives in [place] now. Recently moved from [place].   No pets.   Mother of Audra Kagel.   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 aspirin EC 81 MG tablet Take 81 mg by mouth daily.    . BD ULTRA-FINE LANCETS lancets Use as instructed upto 6 times daily 600 each 2  . benzocaine (ORAJEL) 10 % mucosal gel Use as directed 1 application in the mouth or throat 4 (four) times daily as needed for mouth pain.    . chlorpheniramine-HYDROcodone (TUSSIONEX PENNKINETIC ER) 10-8 MG/5ML SUER Take 5 mLs by mouth at bedtime as needed for cough (sedation precautions). 100 mL 0  . clopidogrel (PLAVIX) 75 MG tablet Take 1 tablet (75 mg total) by mouth daily. 30 tablet 6  . diclofenac sodium (VOLTAREN) 1 % GEL Apply 1 application topically 3 (three) times daily. 1 Tube 1  . glucagon (GLUCAGON EMERGENCY) 1 MG injection Inject 1 mg  into the muscle once as needed for up to 1 dose. 1 each 12  . glucose blood (CONTOUR NEXT TEST) test strip Use as instructed to check 4 times daily 400 each 5  . insulin aspart (NOVOLOG) 100 UNIT/ML FlexPen Inject 6-10 Units into the skin 3 (three) times daily with meals. 15 mL 11  . Insulin Degludec (TRESIBA FLEXTOUCH) 200 UNIT/ML SOPN Inject 18 Units into the skin daily. 3 pen 3  . Insulin Pen  Needle 32G X 4 MM MISC Use 4-5x a day 300 each 3  . Krill Oil 500 MG CAPS Take 2 capsules (1,000 mg total) by mouth daily. (Patient taking differently: Take 500 mg by mouth daily. )    . lisinopril (ZESTRIL) 10 MG tablet TAKE 1 TABLET BY MOUTH ONCE DAILY 90 tablet 1  . metoprolol succinate (TOPROL-XL) 25 MG 24 hr tablet Take 1 tablet (25 mg total) by mouth daily. 30 tablet 11  . neomycin-polymyxin-dexamethasone (MAXITROL) 0.1 % ophthalmic suspension Place 1 drop into both eyes daily.    . nitroGLYCERIN (NITROSTAT) 0.4 MG SL tablet Place 1 tablet (0.4 mg total) under the tongue every 5 (five) minutes as needed for chest pain. 30 tablet 1  . pantoprazole (PROTONIX) 40 MG tablet Take 1 tablet (40 mg total) by mouth daily. 90 tablet 1  . PLAVIX 75 MG tablet Take 1 tablet (75 mg total) by mouth daily. 90 tablet 1  . rosuvastatin (CRESTOR) 10 MG tablet Take 1 tablet (10 mg total) by mouth every other day. (Patient taking differently: Take 10 mg by mouth daily. ) 30 tablet 6  . SYNTHROID 75 MCG tablet TAKE 1 TABLET BY MOUTH ONCE DAILY FOR 6 DAYS AND 1 AND 1/2 TABLETS ON THE 7TH DAY (Patient taking differently: Take 37.5-75 mcg by mouth See admin instructions. Take 37.5 mcg by mouth daily on Sunday. Take 75 mcg by mouth daily on all other days.) 90 tablet 3  . traMADol (ULTRAM) 50 MG tablet Take 0.5-1 tablets (25-50 mg total) by mouth 2 (two) times daily as needed for moderate pain. 8 tablet 0   No current facility-administered medications on file prior to visit.    Allergies  Allergen Reactions  . Iodinated Diagnostic Agents Other (See Comments)    Itching (severe) and chest tightness  . Penicillins Anaphylaxis, Swelling, Rash and Other (See Comments)    Has patient had a PCN reaction causing immediate rash, facial/tongue/throat swelling, SOB or lightheadedness with hypotension: Yes Has patient had a PCN reaction causing severe rash involving mucus membranes or skin necrosis: Unknown Has patient had a  PCN reaction that required hospitalization: Unknown Has patient had a PCN reaction occurring within the last 10 years: No If all of the above answers are "NO", then may proceed with Cephalosporin use.   Marland Kitchen Anectine [Succinylcholine] Other (See Comments)    Brother went into cardiac arrest.  . Codeine Nausea Only  . Gabapentin Other (See Comments)    Gait abnormality  . Influenza Vaccines Other (See Comments)    Muscle weakness; unable to walk  . Nortriptyline Other (See Comments)    Eye swelling and mouth drawed up  . Pamelor [Nortriptyline Hcl] Other (See Comments)    Patient states caused her face to draw in together.  . Valsartan Other (See Comments) and Cough    Allergy to generic only, "Hacking" cough  . Erythromycin Rash and Swelling  . Sulfa Antibiotics Rash   Family History  Problem Relation Age of Onset  . CAD  Mother 68       MI, aortic valve issues  . COPD Mother   . Lupus Mother   . Graves' disease Mother   . Rheum arthritis Mother   . CAD Father 2       CABG x2, aortic valve replacement  . Stroke Sister   . Alcohol abuse Brother   . CAD Brother 29       MI  . Stroke Brother   . Seizures Son   . COPD Brother   . CAD Brother 91       stent  . Diabetes Brother   . Depression Grandchild   . Cancer Maternal Aunt        breast  . Breast cancer Maternal Aunt   . Diabetes Sister        deceased  . CAD Sister 34       stents  . Breast cancer Maternal Aunt    PE: BP 130/72   Pulse 76   Ht 5' 3.75" (1.619 m)   Wt 151 lb (68.5 kg)   SpO2 98%   BMI 26.12 kg/m  Wt Readings from Last 3 Encounters:  03/11/19 151 lb (68.5 kg)  12/31/18 144 lb (65.3 kg)  12/17/18 150 lb 4 oz (68.2 kg)   Constitutional: Normal weight, in NAD Eyes: PERRLA, EOMI, no exophthalmos ENT: moist mucous membranes, no thyromegaly, no cervical lymphadenopathy Cardiovascular: RRR, No MRG Respiratory: CTA B Gastrointestinal: abdomen soft, NT, ND, BS+ Musculoskeletal: no deformities,  strength intact in all 4 Skin: moist, warm, no rashes Neurological: no tremor with outstretched hands, DTR normal in all 4  ASSESSMENT: 1. DM1, uncontrolled, without long-term complications, but with hyperglycemia  2.  Hashimoto's hypothyroidism  PLAN:  1. Patient with longstanding, uncontrolled, LADA, previously on an insulin pump, but off the pump now due to problems with obtaining her supplies from [facility].  At last visit she was contemplating coming off the pump and I gave her a regimen containing Tresiba and NovoLog.  She is now using the [place] dose recommended but she is using a radically different NovoLog regimen.  Her insulin sensitivity factor is much lower than recommended and her insulin to carb ratio is much higher.  It actually appears that she is switched to in her calculations. -At this visit, we reviewed her sugars, which are overall higher and more fluctuating than before.  However, she is using an insulin to carb ratio of 1: 45, which is very high and she is using a very low insulin sensitivity factor of 1: 8.  Sugars after meals are usually quite a bit higher than before meals.  At this visit, we changed these to an insulin to carb ratio of 1: 8 for a meal with more starches and 1: 10 for a meal with less starches and an insulin sensitivity factor 1: 20.  I gave her multiple examples of how to calculate these and, at the end, we performed several exercises and she appears to have understood how to use them.  I also advised her to use a lower target, of 130.  She was also advised not to correct bedtime sugars that are lower than 200 and when she corrects not to use the sliding scale given above, but to only use 2 to 3 units of insulin.  She had one low blood sugar at night, 69, following her correction for high blood sugar at that time, in the 200s. -We also discussed about how and when to eat  fruit to decrease the hyperglycemic peak that usually follow  - I advised her to:  Patient  Instructions  Please continue: - Tresiba 18 units in am  Change: - Novolog to: ICR: 1:8 (starches)-1:10 (other foods) Target: 130 Insulin sensitivity factor: 20  If you correct sugars at bedtime, only correct >200 and only give 2-3 units.  Please return in 3 months with your sugar log.   - today, HbA1c is 8.4% (higher) - continue checking sugars at different times of the day - check 3x a day, rotating checks - advised for yearly eye exams >> she is UTD - Return to clinic in 3 mo with sugar log     2.  Hashimoto's hypothyroidism - latest thyroid labs reviewed with pt >> normal  10/2018 - she continues on LT4 75 mcg 6/7 days, 37.5 mcg 1/7 days - pt feels good on this dose. - we discussed about taking the thyroid hormone every day, with water, >30 minutes before breakfast, separated by >4 hours from acid reflux medications, calcium, iron, multivitamins. Pt. is taking it correctly.  - time spent with the patient: 40 min, of which >50% was spent in reviewing her meter downloads, discussing her hypo- and hyper-glycemic episodes, reviewing her insulin doses, reviewing in detail how to calculate her mealtime boluses, and developing a plan to avoid hypo- and hyper-glycemia.  Patient had a number of questions that I answered.   Philemon Kingdom, MD PhD [facility] Endocrinology

## 2019-03-12 NOTE — Assessment & Plan Note (Signed)
Appreciate rheum care. Upcoming dental work - clearance provided.

## 2019-03-12 NOTE — Assessment & Plan Note (Signed)
Appreciate endo care. Recent deterioration in A1c. Insulin dosing adjustments have been made.

## 2019-03-12 NOTE — Assessment & Plan Note (Signed)
Chronic, stable. Continue current regimen. 

## 2019-03-12 NOTE — Assessment & Plan Note (Signed)
Appreciate [facility] care.  

## 2019-03-12 NOTE — Assessment & Plan Note (Signed)
Has had reassuring cardiac evaluation previously, however with new chest pressure discomfort I did suggest she call to schedule sooner appointment with cardiology. Pt aware to seek urgent care if worsening chest pain.

## 2019-03-12 NOTE — Assessment & Plan Note (Signed)
Increasing myalgias, but pt endorses this is different (worse) than her previous FM flares.

## 2019-03-21 DIAGNOSIS — M3500 Sicca syndrome, unspecified: Secondary | ICD-10-CM | POA: Diagnosis not present

## 2019-03-21 DIAGNOSIS — R768 Other specified abnormal immunological findings in serum: Secondary | ICD-10-CM | POA: Diagnosis not present

## 2019-03-21 DIAGNOSIS — M797 Fibromyalgia: Secondary | ICD-10-CM | POA: Diagnosis not present

## 2019-03-21 DIAGNOSIS — I7300 Raynaud's syndrome without gangrene: Secondary | ICD-10-CM | POA: Diagnosis not present

## 2019-03-24 NOTE — Progress Notes (Signed)
Cardiology Office Note  Date:  03/26/2019   ID:  Cassandra Benson, DOB [birth year], MRN [id number]  PCP:  [name], MD   Chief Complaint  Patient presents with  . other    Patient c.o chest heaviness when walking. Meds reviewed verbally with patient.     HPI:  Cassandra Benson is a very pleasant 78 year old woman with  15 years of diabetes, on insulin,  Hyperlipidemia, family history of coronary artery disease,  prior episodes of chest pain,  80% stenosis of the left internal carotid artery.  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  Went to the emergency room, cardiac enzymes negative 3,  chest CT with no PE.   She does report prior cardiac catheterization in Dec 31, 1998 showing no significant disease CT scan done 04/2015 in the emergency room shows no coronary calcifications, minimal minimal descending aorta atherosclerosis   PMH:   has a past medical history of Allergy, ANA positive, Arthritis, Carotid  stenosis, asymptomatic (06/19/2015), Dermatomyositis ([place]), Diabetes mellitus without complication ([place]), Family history of adverse reaction to anesthesia, Fibromyalgia, GERD (gastroesophageal reflux disease), Glaucoma, History of blood clots, History of chicken pox, History of diverticulitis, History of pericarditis (12-30-1984), History of pneumonia (Dec 30, 2012), History of shingles, History of UTI, Hyperlipidemia, Hypertension, Hypothyroidism, Mixed connective tissue disease ([place]), PONV (postoperative nausea and vomiting), Raynaud's disease without gangrene, Shoulder pain (left), Sjogren's syndrome ([place]), Sleep apnea, 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  PSH:    Past Surgical History:  Procedure Laterality Date  . ABDOMINAL HYSTERECTOMY  1978   fibroids and menorrhagia, ovaries remain  . ARTERY BIOPSY Right 04/06/2018   Procedure: BIOPSY TEMPORAL ARTERY RIGHT;  Surgeon: Beverly Gust, MD;  Location: [facility];  Service: ENT;  Laterality: Right;  Diabetic - insulin pump sleep apnea  . [facility] normal per patient  . COLONOSCOPY WITH ESOPHAGOGASTRODUODENOSCOPY (EGD)  03/2007   2 ulcers, benign polyp, rpt 5 yrs ([place], [place])  . PARTIAL HIP ARTHROPLASTY  12/31/2011   Right hip replacement  . TONSILLECTOMY AND ADENOIDECTOMY    . TRANSCAROTID ARTERY REVASCULARIZATION Left 07/23/2018   Procedure: TRANSCAROTID ARTERY REVASCULARIZATION;  Surgeon: Marty Heck, MD;  Location: [facility];  Service: Vascular;  Laterality: Left;  . TUBAL LIGATION    . VAGINAL DELIVERY     x2, no complications    Current Outpatient Medications  Medication Sig Dispense Refill  . aspirin EC 81 MG tablet Take 81 mg by mouth daily.    . BD ULTRA-FINE LANCETS lancets Use  as instructed upto 6 times daily 600 each 2  . benzocaine (ORAJEL) 10 % mucosal gel Use as directed 1 application in the mouth or throat 4 (four) times daily as needed for mouth pain.    .  chlorpheniramine-HYDROcodone (TUSSIONEX PENNKINETIC ER) 10-8 MG/5ML SUER Take 5 mLs by mouth at bedtime as needed for cough (sedation precautions). 100 mL 0  . clopidogrel (PLAVIX) 75 MG tablet Take 1 tablet (75 mg total) by mouth daily. 30 tablet 6  . diclofenac sodium (VOLTAREN) 1 % GEL Apply 1 application topically 3 (three) times daily. 1 Tube 1  . glucagon (GLUCAGON EMERGENCY) 1 MG injection Inject 1 mg into the muscle once as needed for up to 1 dose. 1 each 12  . glucose blood (CONTOUR NEXT TEST) test strip Use as instructed to check 4 times daily 400 each 5  . insulin aspart (NOVOLOG) 100 UNIT/ML FlexPen Inject 6-10 Units into the skin 3 (three) times daily with meals. 15 mL 11  . Insulin Degludec (TRESIBA FLEXTOUCH) 200 UNIT/ML SOPN Inject 18 Units into the skin daily. 3 pen 3  . Insulin Pen Needle 32G X 4 MM MISC Use 4-5x a day 300 each 3  . Krill Oil 500 MG CAPS Take 2 capsules (1,000 mg total) by mouth daily. (Patient taking differently: Take 500 mg by mouth daily. )    . lisinopril (ZESTRIL) 10 MG tablet TAKE 1 TABLET BY MOUTH ONCE DAILY 90 tablet 1  . metoprolol succinate (TOPROL-XL) 25 MG 24 hr tablet Take 1 tablet (25 mg total) by mouth daily. 30 tablet 11  . neomycin-polymyxin-dexamethasone (MAXITROL) 0.1 % ophthalmic suspension Place 1 drop into both eyes daily.    . nitroGLYCERIN (NITROSTAT) 0.4 MG SL tablet Place 1 tablet (0.4 mg total) under the tongue every 5 (five) minutes as needed for chest pain. 30 tablet 1  . pantoprazole (PROTONIX) 40 MG tablet Take 1 tablet (40 mg total) by mouth daily. 90 tablet 1  . rosuvastatin (CRESTOR) 10 MG tablet Take 1 tablet (10 mg total) by mouth every other day. (Patient taking differently: Take 10 mg by mouth daily. ) 30 tablet 6  . SYNTHROID 75 MCG tablet TAKE 1 TABLET BY MOUTH ONCE DAILY FOR 6 DAYS AND 1 AND 1/2 TABLETS ON THE 7TH DAY (Patient taking differently: Take 37.5-75 mcg by mouth See admin instructions. Take 37.5 mcg by mouth daily on  Sunday. Take 75 mcg by mouth daily on all other days.) 90 tablet 3   No current facility-administered medications for this visit.      Allergies:   Iodinated diagnostic agents, Penicillins, Anectine [succinylcholine], Codeine, Gabapentin, Influenza vaccines, Nortriptyline, Pamelor [nortriptyline hcl], Valsartan, Erythromycin, and Sulfa antibiotics   Social History:  The patient  reports that she has never smoked. She has never used smokeless tobacco. She reports that she does not drink alcohol or use drugs.   Family History:   family history includes Alcohol abuse in her brother; Breast cancer in her maternal aunt and maternal aunt; CAD (age of onset: 11) in her brother; CAD (age of onset: 79) in her brother; CAD (age of onset: 17) in her father and mother; CAD (age of onset: 33) in her sister; COPD in her brother and mother; Cancer in her maternal aunt; Depression in her grandchild; Diabetes in her brother and sister; [name] in her mother; Lupus in her mother; Rheum arthritis in her mother; Seizures in her son; Stroke in her brother and sister.    Review  of Systems: Review of Systems  Constitutional: Positive for malaise/fatigue.  Respiratory: Negative.   Cardiovascular: Negative.   Gastrointestinal: Negative.   Musculoskeletal: Negative.   Neurological: Negative.   Psychiatric/Behavioral: Negative.   All other systems reviewed and are negative.   PHYSICAL EXAM: VS:  BP 132/66 (BP Location: Left Arm, Patient Position: Sitting, Cuff Size: Normal)   Pulse 73   Ht 5\' 3"  (1.6 m)   Wt 153 lb (69.4 kg)   BMI 27.10 kg/m  , BMI Body mass index is 27.1 kg/m. Constitutional:  oriented to person, place, and time. No distress.  HENT:  Head: Grossly normal Eyes:  no discharge. No scleral icterus.  Neck: No JVD, no carotid bruits  Cardiovascular: Regular rate and rhythm, no murmurs appreciated Pulmonary/Chest: Clear to auscultation bilaterally, no wheezes or rails Abdominal: Soft.   no distension.  no tenderness.  Musculoskeletal: Normal range of motion Neurological:  normal muscle tone. Coordination normal. No atrophy Skin: Skin warm and dry Psychiatric: normal affect, pleasant   Recent Labs: 08/29/2018: ALT 12 10/07/2018: BUN 18; Creatinine, Ser 0.97; Hemoglobin 13.1; Platelets 185; Potassium 4.2; Sodium 134 11/06/2018: TSH 1.10    Lipid Panel Lab Results  Component Value Date   CHOL 155 08/29/2018   HDL 43.30 08/29/2018   LDLCALC 88 08/29/2018   TRIG 115.0 08/29/2018      Wt Readings from Last 3 Encounters:  03/26/19 153 lb (69.4 kg)  03/11/19 150 lb (68 kg)  03/11/19 151 lb (68.5 kg)     ASSESSMENT AND PLAN:  Essential hypertension - Plan: EKG 12-Lead Blood pressure is well controlled on today's visit. No changes made to the medications.  LADA (latent autoimmune diabetes in adults), managed as type 1 ([place]) Strongly recommended regular walking program on a daily basis   bilateral carotid artery stenosis Tolerating Crestor 10 mg daily Recent stent on the left Previously declined Zetia  Adjustment disorder with mixed anxiety and depressed mood  loss of her husband, as well as other family members Suggested exercise program  Chest pain Typical and atypical features Will order a lexiscan myoview High risk given severe PAD/carotid disease  Pure hypercholesterolemia Tolerating Crestor 10,  May need titration up in the future   Total encounter time more than 25 minutes  Greater than 50% was spent in counseling and coordination of care with the patient   Disposition:   F/U  12 months   Orders Placed This Encounter  Procedures  . NM Myocar Multi W/Spect W/Wall Motion / EF  . EKG 12-Lead     Signed, Esmond Plants, M.D., Ph.D. 03/26/2019  [place], [place]

## 2019-03-25 ENCOUNTER — Telehealth: Payer: Self-pay

## 2019-03-25 NOTE — Telephone Encounter (Signed)
    COVID-19 Pre-Screening Questions:  . In the past 7 to 10 days have you had a cough, shortness of breath, headache, congestion, fever (100 or greater), body aches, chills, sore throat, or sudden loss of taste or sense of smell? NO . Have you been around anyone with known Covid 19? NO . Have you been around anyone who is awaiting Covid 19 test results in the past 7 to 10 days? NO . Have you been around anyone who has been exposed to Covid 19, or has mentioned symptoms of Covid 19 within the past 7 to 10 days? NO  If you have any concerns/questions about symptoms patients report during screening (either on the phone or at threshold). Contact the provider seeing the patient or DOD for further guidance.  If neither are available contact a member of the leadership team.            

## 2019-03-26 ENCOUNTER — Ambulatory Visit: Payer: PPO | Admitting: Cardiovascular Disease

## 2019-03-26 ENCOUNTER — Encounter: Payer: Self-pay | Admitting: Cardiovascular Disease

## 2019-03-26 ENCOUNTER — Other Ambulatory Visit: Payer: Self-pay

## 2019-03-26 ENCOUNTER — Encounter: Payer: Self-pay | Admitting: Family Medicine

## 2019-03-26 VITALS — BP 132/66 | HR 73 | Ht 63.0 in | Wt 153.0 lb

## 2019-03-26 DIAGNOSIS — R0789 Other chest pain: Secondary | ICD-10-CM | POA: Diagnosis not present

## 2019-03-26 DIAGNOSIS — Z794 Long term (current) use of insulin: Secondary | ICD-10-CM

## 2019-03-26 DIAGNOSIS — I739 Peripheral vascular disease, unspecified: Secondary | ICD-10-CM

## 2019-03-26 DIAGNOSIS — I10 Essential (primary) hypertension: Secondary | ICD-10-CM | POA: Diagnosis not present

## 2019-03-26 DIAGNOSIS — R079 Chest pain, unspecified: Secondary | ICD-10-CM

## 2019-03-26 DIAGNOSIS — E782 Mixed hyperlipidemia: Secondary | ICD-10-CM | POA: Diagnosis not present

## 2019-03-26 DIAGNOSIS — I6523 Occlusion and stenosis of bilateral carotid arteries: Secondary | ICD-10-CM

## 2019-03-26 DIAGNOSIS — E1169 Type 2 diabetes mellitus with other specified complication: Secondary | ICD-10-CM

## 2019-03-26 DIAGNOSIS — I6529 Occlusion and stenosis of unspecified carotid artery: Secondary | ICD-10-CM

## 2019-03-26 DIAGNOSIS — M329 Systemic lupus erythematosus, unspecified: Secondary | ICD-10-CM

## 2019-03-26 NOTE — Patient Instructions (Addendum)
We will order a pharmacological stress test for angina pain  Medication Instructions:  No changes  If you need a refill on your cardiac medications before your next appointment, please call your pharmacy.    Lab work: No new labs needed   If you have labs (blood work) drawn today and your tests are completely normal, you will receive your results only by: Marland Kitchen MyChart Message (if you have MyChart) OR . A paper copy in the mail If you have any lab test that is abnormal or we need to change your treatment, we will call you to review the results.   Testing/Procedures: [place]  Your caregiver has ordered a Stress Test with nuclear imaging. The purpose of this test is to evaluate the blood supply to your heart muscle. This procedure is referred to as a "Non-Invasive Stress Test." This is because other than having an IV started in your vein, nothing is inserted or "invades" your body. Cardiac stress tests are done to find areas of poor blood flow to the heart by determining the extent of coronary artery disease (CAD). Some patients exercise on a treadmill, which naturally increases the blood flow to your heart, while others who are  unable to walk on a treadmill due to physical limitations have a pharmacologic/chemical stress agent called Lexiscan . This medicine will mimic walking on a treadmill by temporarily increasing your coronary blood flow.   Please note: these test may take anywhere between 2-4 hours to complete  PLEASE REPORT TO [facility]  THE VOLUNTEERS AT THE FIRST DESK WILL DIRECT YOU WHERE TO GO  Date of Procedure:__Thursday July 2____  Arrival Time for Procedure:____07:45 AM____  Instructions regarding medication:   _XX___ : Hold diabetes medication morning of procedure  _XX___:  Hold Metoprolol the night before procedure and morning of procedure   PLEASE NOTIFY THE OFFICE AT LEAST 24 HOURS IN ADVANCE IF YOU ARE UNABLE TO KEEP YOUR APPOINTMENT.   [phone] AND  PLEASE NOTIFY NUCLEAR MEDICINE AT [facility] AT LEAST 24 HOURS IN ADVANCE IF YOU ARE UNABLE TO KEEP YOUR APPOINTMENT. [phone]  How to prepare for your Myoview test:  1. Do not eat or drink after midnight 2. No caffeine for 24 hours prior to test 3. No smoking 24 hours prior to test. 4. Your medication may be taken with water.  If your doctor stopped a medication because of this test, do not take that medication. 5. Ladies, please do not wear dresses.  Skirts or pants are appropriate. Please wear a short sleeve shirt. 6. No perfume, cologne or lotion. 7. Wear comfortable walking shoes. No heels!   Follow-Up: At [facility], you and your health needs are our priority.  As part of our continuing mission to provide you with exceptional heart care, we have created designated Provider Care Teams.  These Care Teams include your primary Cardiologist (physician) and Advanced Practice Providers (APPs -  Physician Assistants and Nurse Practitioners) who all work together to provide you with the care you need, when you need it.  . You will need a follow up appointment in 12 months .   Please call our office 2 months in advance to schedule this appointment.    . Providers on your designated Care Team:   . Murray Hodgkins, NP . Christell Faith, PA-C . Marrianne Mood, PA-C  Any Other Special Instructions Will Be Listed Below (If Applicable).  For educational health videos Log in to : [email or web address] Or : [email or web address], password :  [email or web address]

## 2019-03-28 ENCOUNTER — Ambulatory Visit
Admission: RE | Admit: 2019-03-28 | Discharge: 2019-03-28 | Disposition: A | Discharge status: Home / Self Care | Payer: PPO | Source: Ambulatory Visit | Attending: Cardiovascular Disease | Admitting: Cardiovascular Disease

## 2019-03-28 ENCOUNTER — Other Ambulatory Visit: Payer: Self-pay

## 2019-03-28 DIAGNOSIS — R0789 Other chest pain: Secondary | ICD-10-CM | POA: Diagnosis not present

## 2019-03-28 DIAGNOSIS — R079 Chest pain, unspecified: Secondary | ICD-10-CM

## 2019-03-28 LAB — NM MYOCAR MULTI W/SPECT W/WALL MOTION / EF
Estimated workload: 1 METS
Exercise duration (min): 0 min
Exercise duration (sec): 0 s
LV dias vol: 38 mL (ref 46–106)
LV sys vol: 15 mL
MPHR: 144 {beats}/min
Peak HR: 102 {beats}/min
Percent HR: 70 %
Rest HR: 67 {beats}/min
SDS: 0
SRS: 0
SSS: 0
TID: 0.95

## 2019-03-28 MED ORDER — TECHNETIUM TC 99M TETROFOSMIN IV KIT
10.8610 | PACK | Freq: Once | INTRAVENOUS | Status: AC | PRN
  Administered 2019-03-28: 10.861 via INTRAVENOUS

## 2019-03-28 MED ORDER — TECHNETIUM TC 99M TETROFOSMIN IV KIT
31.9040 | PACK | Freq: Once | INTRAVENOUS | Status: AC | PRN
  Administered 2019-03-28: 10:00:00 31.904 via INTRAVENOUS

## 2019-03-28 MED ORDER — REGADENOSON 0.4 MG/5ML IV SOLN
0.4000 mg | Freq: Once | INTRAVENOUS | Status: AC
  Administered 2019-03-28: 0.4 mg via INTRAVENOUS

## 2019-04-07 ENCOUNTER — Encounter: Payer: Self-pay | Admitting: Internal Medicine

## 2019-04-15 ENCOUNTER — Telehealth: Payer: Self-pay | Admitting: Family Medicine

## 2019-04-15 NOTE — Telephone Encounter (Signed)
BEST NUMBER [phone] Melissa @ [facility] called checking on medical clearance form Pt is wanted to get this schedule with dental office   Fax [phone]

## 2019-04-16 NOTE — Telephone Encounter (Signed)
Dr Darnell Level, do you by any chance have this medical clearance form?

## 2019-04-16 NOTE — Telephone Encounter (Signed)
This should have been faxed over earlier today - I put it in my out box yesterday.

## 2019-04-16 NOTE — Telephone Encounter (Addendum)
Cassandra Benson please advise if you by any chance grabbed this and faxed it. Thanks.

## 2019-04-17 NOTE — Telephone Encounter (Signed)
Spoke with [facility] - they did receive the surgery clearance form.  Nothing further needed.

## 2019-04-17 NOTE — Telephone Encounter (Signed)
I faxed a surgical clearance to [facility].

## 2019-05-11 ENCOUNTER — Other Ambulatory Visit: Payer: Self-pay | Admitting: Family Medicine

## 2019-05-13 ENCOUNTER — Other Ambulatory Visit: Payer: Self-pay

## 2019-05-13 ENCOUNTER — Encounter: Payer: Self-pay | Admitting: Family Medicine

## 2019-05-13 ENCOUNTER — Ambulatory Visit (INDEPENDENT_AMBULATORY_CARE_PROVIDER_SITE_OTHER): Payer: PPO | Admitting: Family Medicine

## 2019-05-13 ENCOUNTER — Ambulatory Visit (INDEPENDENT_AMBULATORY_CARE_PROVIDER_SITE_OTHER)
Admission: RE | Admit: 2019-05-13 | Discharge: 2019-05-13 | Disposition: A | Discharge status: Home / Self Care | Payer: PPO | Source: Ambulatory Visit | Attending: Family Medicine | Admitting: Family Medicine

## 2019-05-13 VITALS — BP 140/64 | HR 63 | Temp 98.2°F | Ht 63.75 in | Wt 155.1 lb

## 2019-05-13 DIAGNOSIS — H3530 Unspecified macular degeneration: Secondary | ICD-10-CM

## 2019-05-13 DIAGNOSIS — I7300 Raynaud's syndrome without gangrene: Secondary | ICD-10-CM

## 2019-05-13 DIAGNOSIS — M3500 Sicca syndrome, unspecified: Secondary | ICD-10-CM | POA: Diagnosis not present

## 2019-05-13 DIAGNOSIS — M546 Pain in thoracic spine: Secondary | ICD-10-CM

## 2019-05-13 DIAGNOSIS — E139 Other specified diabetes mellitus without complications: Secondary | ICD-10-CM | POA: Diagnosis not present

## 2019-05-13 DIAGNOSIS — G8929 Other chronic pain: Secondary | ICD-10-CM

## 2019-05-13 DIAGNOSIS — I10 Essential (primary) hypertension: Secondary | ICD-10-CM

## 2019-05-13 NOTE — Patient Instructions (Addendum)
Good to see you today! Continue current medicines.  We will refer you to new eye doctor  Thoracic xrays today to further evaluate midline thoracic spine.

## 2019-05-13 NOTE — Progress Notes (Signed)
This visit was conducted in person.  BP 140/64 (BP Location: Left Arm, Patient Position: Sitting, Cuff Size: Normal)   Pulse 63   Temp 98.2 F (36.8 C) (Temporal)   Ht 5' 3.75" (1.619 m)   Wt 155 lb 1 oz (70.3 kg)   SpO2 98%   BMI 26.83 kg/m    CC: 3 mo f/u visit Subjective:    Patient ID: Cassandra Benson, female    DOB: 08-20-1941, 78 y.o.   MRN: [id number]  HPI: Cassandra Benson is a 78 y.o. female presenting on 05/13/2019 for Follow-up (Here for 3 mo f/u.)   Saw Dr Rockey Situ earlier in the summer, underwent reassuring stress test. She thinks previous chest pain likely related to chest wall strain (after lifting a dozen 20 lb soil bags).   HTN - BPs running well controlled 120-140/60-70s, HR 50-70s.  DM - managed by endo. Recently running 200s throughout the day. She will be sending in sugar logs for Dr Arman Filter review.   Rheum - ANA positive ?false positive. Known raynaud's and sjogren syndrome without extraglandular involvement. Has seen Dr Meda Coffee, had bad experience latest visit, doesn't want to return.   Uses eye drops for macular degeneration - has seen Dr George Ina. Would like to see new eye doctor.   Has established with [facility] locally - planned cleanings Q4 mo due to Sjogren's.   Constant thoracic back pain for 3+ months. Manages with tylenol 650mg  Q8 hours. Denies inciting trauma/injury or falls.      Relevant past medical, surgical, family and social history reviewed and updated as indicated. Interim medical history since our last visit reviewed. Allergies and medications reviewed and updated. Outpatient Medications Prior to Visit  Medication Sig Dispense Refill  . aspirin EC 81 MG tablet Take 81 mg by mouth daily.    . BD ULTRA-FINE LANCETS lancets Use as instructed upto 6 times daily 600 each 2  . benzocaine (ORAJEL) 10 % mucosal gel Use as directed 1 application in the mouth or throat 4 (four) times daily as needed for mouth pain.    .  chlorpheniramine-HYDROcodone (TUSSIONEX PENNKINETIC ER) 10-8 MG/5ML SUER Take 5 mLs by mouth at bedtime as needed for cough (sedation precautions). 100 mL 0  . clopidogrel (PLAVIX) 75 MG tablet Take 1 tablet (75 mg total) by mouth daily. 30 tablet 6  . diclofenac sodium (VOLTAREN) 1 % GEL Apply 1 application topically 3 (three) times daily. 1 Tube 1  . glucagon (GLUCAGON EMERGENCY) 1 MG injection Inject 1 mg into the muscle once as needed for up to 1 dose. 1 each 12  . glucose blood (CONTOUR NEXT TEST) test strip Use as instructed to check 4 times daily 400 each 5  . insulin aspart (NOVOLOG) 100 UNIT/ML FlexPen Inject 6-10 Units into the skin 3 (three) times daily with meals. 15 mL 11  . Insulin Degludec (TRESIBA FLEXTOUCH) 200 UNIT/ML SOPN Inject 18 Units into the skin daily. 3 pen 3  . Insulin Pen Needle 32G X 4 MM MISC Use 4-5x a day 300 each 3  . Krill Oil 500 MG CAPS Take 2 capsules (1,000 mg total) by mouth daily. (Patient taking differently: Take 500 mg by mouth daily. )    . lisinopril (ZESTRIL) 10 MG tablet TAKE 1 TABLET BY MOUTH ONCE DAILY 90 tablet 1  . metoprolol succinate (TOPROL-XL) 25 MG 24 hr tablet Take 1 tablet (25 mg total) by mouth daily. 30 tablet 11  . neomycin-polymyxin-dexamethasone (MAXITROL) 0.1 %  ophthalmic suspension Place 1 drop into both eyes daily.    . nitroGLYCERIN (NITROSTAT) 0.4 MG SL tablet Place 1 tablet (0.4 mg total) under the tongue every 5 (five) minutes as needed for chest pain. 30 tablet 1  . pantoprazole (PROTONIX) 40 MG tablet Take 1 tablet (40 mg total) by mouth daily. 90 tablet 1  . rosuvastatin (CRESTOR) 10 MG tablet Take 1 tablet (10 mg total) by mouth every other day. (Patient taking differently: Take 10 mg by mouth daily. ) 30 tablet 6  . SYNTHROID 75 MCG tablet TAKE 1 TABLET BY MOUTH ONCE DAILY FOR 6 DAYS AND 1 AND 1/2 TABLETS ON THE 7TH DAY (Patient taking differently: Take 37.5-75 mcg by mouth See admin instructions. Take 37.5 mcg by mouth daily on  Sunday. Take 75 mcg by mouth daily on all other days.) 90 tablet 3   No facility-administered medications prior to visit.      Per HPI unless specifically indicated in ROS section below Review of Systems Objective:    BP 140/64 (BP Location: Left Arm, Patient Position: Sitting, Cuff Size: Normal)   Pulse 63   Temp 98.2 F (36.8 C) (Temporal)   Ht 5' 3.75" (1.619 m)   Wt 155 lb 1 oz (70.3 kg)   SpO2 98%   BMI 26.83 kg/m   Wt Readings from Last 3 Encounters:  05/13/19 155 lb 1 oz (70.3 kg)  03/26/19 153 lb (69.4 kg)  03/11/19 150 lb (68 kg)    Physical Exam Vitals signs and nursing note reviewed.  Constitutional:      General: She is not in acute distress.    Appearance: Normal appearance. She is not ill-appearing.  HENT:     Head: Normocephalic and atraumatic.  Cardiovascular:     Rate and Rhythm: Normal rate and regular rhythm.     Pulses: Normal pulses.     Heart sounds: Normal heart sounds. No murmur.  Pulmonary:     Effort: Pulmonary effort is normal. No respiratory distress.     Breath sounds: Normal breath sounds. No wheezing, rhonchi or rales.  Musculoskeletal:     Comments: Point tender to palpation midline mid thoracic spine  Neurological:     Mental Status: She is alert.  Psychiatric:        Mood and Affect: Mood normal.        Behavior: Behavior normal.        Assessment & Plan:   Problem List Items Addressed This Visit    Sjogren's syndrome without extraglandular involvement ([place])   Relevant Orders   Ambulatory referral to Ophthalmology   Raynaud disease   LADA (latent autoimmune diabetes in adults), managed as type 1 ([place])   Essential hypertension    Chronic, stable. Continue current regimen of toprol XL 25mg  daily.      Chronic midline thoracic back pain - Primary    Update thoracic films to r/o compression fracture given worsening thoracic back pain and reproducible midline tenderness on exam today.       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 Would like new eye doctor referral which was placed.       Relevant Orders   Ambulatory referral to Ophthalmology       No orders of the defined types were placed in this encounter.  Orders Placed This Encounter  Procedures  . DG Thoracic Spine W/Swimmers  Standing Status:   Future    Number of Occurrences:   1    Standing Expiration Date:   07/12/2020    Order Specific Question:   Reason for Exam (SYMPTOM  OR DIAGNOSIS REQUIRED)    Answer:   mid thoracic spine x months    Order Specific Question:   Preferred imaging location?    Answer:   [facility]    Order Specific Question:   Radiology Contrast Protocol - do NOT remove file path    Answer:   [email or web address]  . Ambulatory referral to Ophthalmology    Referral Priority:   Routine    Referral Type:   Consultation    Referral Reason:   Specialty Services Required    Requested Specialty:   Ophthalmology    Number of Visits Requested:   1    Patient Instructions  Good to see you today! Continue current medicines.  We will refer you to new eye doctor  Thoracic xrays today to further evaluate midline thoracic spine.   Follow up plan: Return in about 4 months (around 09/12/2019) for annual exam, prior fasting for blood work, medicare wellness visit.  [name], MD

## 2019-05-14 ENCOUNTER — Encounter: Payer: Self-pay | Admitting: Family Medicine

## 2019-05-14 DIAGNOSIS — G8929 Other chronic pain: Secondary | ICD-10-CM | POA: Insufficient documentation

## 2019-05-14 NOTE — Assessment & Plan Note (Signed)
Chronic, stable. Continue current regimen of toprol XL 25mg  daily.

## 2019-05-14 NOTE — Assessment & Plan Note (Addendum)
Advised steroid drops should come from ophthalmology. Would like new eye doctor referral which was placed.

## 2019-05-14 NOTE — Assessment & Plan Note (Addendum)
Update thoracic films to r/o compression fracture given worsening thoracic back pain and reproducible midline tenderness on exam today.

## 2019-06-12 DIAGNOSIS — E109 Type 1 diabetes mellitus without complications: Secondary | ICD-10-CM | POA: Diagnosis not present

## 2019-06-18 ENCOUNTER — Encounter: Payer: Self-pay | Admitting: Family Medicine

## 2019-06-18 ENCOUNTER — Ambulatory Visit (INDEPENDENT_AMBULATORY_CARE_PROVIDER_SITE_OTHER): Payer: PPO | Admitting: Family Medicine

## 2019-06-18 ENCOUNTER — Other Ambulatory Visit: Payer: Self-pay

## 2019-06-18 DIAGNOSIS — M7918 Myalgia, other site: Secondary | ICD-10-CM | POA: Diagnosis not present

## 2019-06-18 MED ORDER — ROSUVASTATIN CALCIUM 10 MG PO TABS: 10.0000 mg | ORAL_TABLET | ORAL | 3 refills | Status: DC

## 2019-06-18 NOTE — Progress Notes (Signed)
This visit was conducted in person.  BP 122/70 (BP Location: Left Arm, Patient Position: Sitting, Cuff Size: Normal)   Pulse 98   Temp 97.9 F (36.6 C) (Temporal)   Ht 5' 3.75" (1.619 m)   Wt 153 lb 1 oz (69.4 kg)   SpO2 98%   BMI 26.48 kg/m    CC: right lower back pain Subjective:    Patient ID: Cassandra Benson, female    DOB: 01/02/1941, 78 y.o.   MRN: [id number]  HPI: Cassandra Benson is a 78 y.o. female presenting on 06/18/2019 for Back Pain (C/o low right side back pain due to fall at home 2 wks ago.  Pain is sharp and shooting radiating down into right hip.  Tried Tyelnol. )   2 wks ago suffered fall at home while working in hard back yard - fell over while squatting down to place [employer]. Landed on R buttock with resultant pain. No bruising. She has some associated radiation down L posterior leg infrequently.   Treating with tylenol 650mg  with benefit.  She has also used heating pad.  Denies numbness or paresthesias.   Planning to drive down with sister to see brother (2 hour drive)      Relevant past medical, surgical, family and social history reviewed and updated as indicated. Interim medical history since our last visit reviewed. Allergies and medications reviewed and updated. Outpatient Medications Prior to Visit  Medication Sig Dispense Refill  . aspirin EC 81 MG tablet Take 81 mg by mouth daily.    . BD ULTRA-FINE LANCETS lancets Use as instructed upto 6 times daily 600 each 2  . benzocaine (ORAJEL) 10 % mucosal gel Use as directed 1 application in the mouth or throat 4 (four) times daily as needed for mouth pain.    . chlorpheniramine-HYDROcodone (TUSSIONEX PENNKINETIC ER) 10-8 MG/5ML SUER Take 5 mLs by mouth at bedtime as needed for cough (sedation precautions). 100 mL 0  . clopidogrel (PLAVIX) 75 MG tablet Take 1 tablet (75 mg total) by mouth daily. 30 tablet 6  . diclofenac sodium (VOLTAREN) 1 % GEL Apply 1 application topically 3 (three) times daily. 1  Tube 1  . glucagon (GLUCAGON EMERGENCY) 1 MG injection Inject 1 mg into the muscle once as needed for up to 1 dose. 1 each 12  . glucose blood (CONTOUR NEXT TEST) test strip Use as instructed to check 4 times daily 400 each 5  . insulin aspart (NOVOLOG) 100 UNIT/ML FlexPen Inject 6-10 Units into the skin 3 (three) times daily with meals. 15 mL 11  . Insulin Degludec (TRESIBA FLEXTOUCH) 200 UNIT/ML SOPN Inject 18 Units into the skin daily. 3 pen 3  . Insulin Pen Needle 32G X 4 MM MISC Use 4-5x a day 300 each 3  . Krill Oil 500 MG CAPS Take 2 capsules (1,000 mg total) by mouth daily. (Patient taking differently: Take 500 mg by mouth daily. )    . lisinopril (ZESTRIL) 10 MG tablet TAKE 1 TABLET BY MOUTH ONCE DAILY 90 tablet 1  . metoprolol succinate (TOPROL-XL) 25 MG 24 hr tablet Take 1 tablet (25 mg total) by mouth daily. 30 tablet 11  . neomycin-polymyxin-dexamethasone (MAXITROL) 0.1 % ophthalmic suspension Place 1 drop into both eyes daily.    . nitroGLYCERIN (NITROSTAT) 0.4 MG SL tablet Place 1 tablet (0.4 mg total) under the tongue every 5 (five) minutes as needed for chest pain. 30 tablet 1  . pantoprazole (PROTONIX) 40 MG tablet Take  1 tablet (40 mg total) by mouth daily. 90 tablet 1  . SYNTHROID 75 MCG tablet TAKE 1 TABLET BY MOUTH ONCE DAILY FOR 6 DAYS AND 1 AND 1/2 TABLETS ON THE 7TH DAY (Patient taking differently: Take 37.5-75 mcg by mouth See admin instructions. Take 37.5 mcg by mouth daily on Sunday. Take 75 mcg by mouth daily on all other days.) 90 tablet 3  . rosuvastatin (CRESTOR) 10 MG tablet TAKE 1 TABLET BY MOUTH EVERY OTHER DAY 30 tablet 4   No facility-administered medications prior to visit.      Per HPI unless specifically indicated in ROS section below Review of Systems Objective:    BP 122/70 (BP Location: Left Arm, Patient Position: Sitting, Cuff Size: Normal)   Pulse 98   Temp 97.9 F (36.6 C) (Temporal)   Ht 5' 3.75" (1.619 m)   Wt 153 lb 1 oz (69.4 kg)   SpO2  98%   BMI 26.48 kg/m   Wt Readings from Last 3 Encounters:  06/18/19 153 lb 1 oz (69.4 kg)  05/13/19 155 lb 1 oz (70.3 kg)  03/26/19 153 lb (69.4 kg)    Physical Exam Vitals signs and nursing note reviewed.  Constitutional:      General: She is not in acute distress.    Appearance: Normal appearance. She is not ill-appearing.  Musculoskeletal: Normal range of motion.     Right lower leg: No edema.     Left lower leg: No edema.     Comments:  Discomfort midline thoracic and lumbar spine (chronic) Discomfort to palpation at lumbar paraspinous mm R>L Neg SLR bilaterally. No pain with int/ext rotation at hip. + FABER on right. ++ pain at right SIJ, GTB and sciatic notch bilaterally.   Skin:    General: Skin is warm and dry.     Findings: No bruising or rash.  Neurological:     Mental Status: She is alert.     Comments: 5/5 strength BLE  Psychiatric:        Mood and Affect: Mood normal.        Behavior: Behavior normal.       Assessment & Plan:   Problem List Items Addressed This Visit    Pain in right buttock    Anticipate piriformis syndrome after short fall onto R buttock.  Supportive care reviewed. Treat with topical NSAID (avoid oral in plavix/aspirin use), continue tylenol, heating pad, provided with stretching exercises for piriformis syndrome.  Update if not improving with treatment. Pt agrees with plan.           Meds ordered this encounter  Medications  . rosuvastatin (CRESTOR) 10 MG tablet    Sig: Take 1 tablet (10 mg total) by mouth every other day.    Dispense:  45 tablet    Refill:  3   No orders of the defined types were placed in this encounter.   Follow up plan: Return if symptoms worsen or fail to improve.  [name], MD

## 2019-06-18 NOTE — Patient Instructions (Addendum)
I think you have inflammation causing pinching of sciatic nerve as it crosses 2 muscles of buttock (piriformis syndrome).  Treat with continued tylenol, exercises provided today, start voltaren gel to R buttock twice daily. Use heating pad as well. Take frequent breaks on upcoming drive.  Let us know if not improving with this.

## 2019-06-18 NOTE — Assessment & Plan Note (Signed)
Anticipate piriformis syndrome after short fall onto R buttock.  Supportive care reviewed. Treat with topical NSAID (avoid oral in plavix/aspirin use), continue tylenol, heating pad, provided with stretching exercises for piriformis syndrome.  Update if not improving with treatment. Pt agrees with plan.

## 2019-06-25 ENCOUNTER — Telehealth: Payer: Self-pay

## 2019-06-25 ENCOUNTER — Other Ambulatory Visit: Payer: Self-pay

## 2019-06-25 NOTE — Telephone Encounter (Signed)
Forms for [employer] supplies filled out, signed by Dr. Cruzita Lederer and faxed to [facility] with confirmation.

## 2019-06-26 DIAGNOSIS — H353132 Nonexudative age-related macular degeneration, bilateral, intermediate dry stage: Secondary | ICD-10-CM | POA: Diagnosis not present

## 2019-06-26 DIAGNOSIS — E109 Type 1 diabetes mellitus without complications: Secondary | ICD-10-CM | POA: Diagnosis not present

## 2019-06-26 DIAGNOSIS — H0012 Chalazion right lower eyelid: Secondary | ICD-10-CM | POA: Diagnosis not present

## 2019-06-26 DIAGNOSIS — H26492 Other secondary cataract, left eye: Secondary | ICD-10-CM | POA: Diagnosis not present

## 2019-06-26 LAB — HM DIABETES EYE EXAM

## 2019-06-27 ENCOUNTER — Ambulatory Visit: Payer: PPO | Admitting: Internal Medicine

## 2019-06-27 ENCOUNTER — Telehealth: Payer: Self-pay | Admitting: Internal Medicine

## 2019-06-27 ENCOUNTER — Encounter: Payer: Self-pay | Admitting: Internal Medicine

## 2019-06-27 ENCOUNTER — Other Ambulatory Visit: Payer: Self-pay | Admitting: Internal Medicine

## 2019-06-27 ENCOUNTER — Other Ambulatory Visit: Payer: Self-pay

## 2019-06-27 VITALS — BP 132/70 | HR 85 | Ht 63.75 in | Wt 153.0 lb

## 2019-06-27 DIAGNOSIS — E063 Autoimmune thyroiditis: Secondary | ICD-10-CM | POA: Diagnosis not present

## 2019-06-27 DIAGNOSIS — E139 Other specified diabetes mellitus without complications: Secondary | ICD-10-CM | POA: Diagnosis not present

## 2019-06-27 DIAGNOSIS — E782 Mixed hyperlipidemia: Secondary | ICD-10-CM | POA: Diagnosis not present

## 2019-06-27 DIAGNOSIS — E038 Other specified hypothyroidism: Secondary | ICD-10-CM | POA: Diagnosis not present

## 2019-06-27 LAB — POCT GLYCOSYLATED HEMOGLOBIN (HGB A1C): Hemoglobin A1C: 9.8 % — AB (ref 4.0–5.6)

## 2019-06-27 MED ORDER — SYNTHROID 75 MCG PO TABS: ORAL_TABLET | ORAL | 3 refills | Status: DC

## 2019-06-27 NOTE — Telephone Encounter (Signed)
Unfortunately, I do not have this in the chart.  She will need to return for this.  I ordered the C-peptide and glucose.  Of note, glucose needs to be lower than 180 at the time of the check.  She can check with her machine and only if sugars are lower than this target, present for labs.

## 2019-06-27 NOTE — Telephone Encounter (Signed)
[employer] is calling for an updated C-peptide result for the patient to get a new pump.  Cassandra Benson [phone] ext [phone]  Please Advise, Thanks

## 2019-06-27 NOTE — Progress Notes (Signed)
Patient ID: Cassandra Benson, female   DOB: 24-Jul-1941, 78 y.o.   MRN: [id number]   HPI: [name] is a 78 y.o.-year-old female, returning for f/u for LADA, initially dx'ed in 1998 (78 y/o), started insulin at dx, started insulin pump in ~2008, uncontrolled, without complications and hypothyroidism. She previously saw endocrinology at [facility] (Dr. Janese Banks) and Dr Howell Rucks. Last visit with me 4 months ago.  She is currently off the insulin pump and on a basal-bolus insulin regimen. Since last visit, sugars are higher and she is considering going back on the pump.  Last hemoglobin A1c was: Lab Results  Component Value Date   HGBA1C 8.4 (A) 03/11/2019   HGBA1C 8.0 (A) 11/06/2018   HGBA1C 8.1 (H) 06/11/2018   She was previously on an insulin pump: - Medtronic 723-started 09/2016 (changed 07/2017), without CGM.  She uses NovoLog in the pump.  She was using [employer] for supplies before, but she is now forced by insurance to use [facility].  She came off the pump as she had a lot of problems getting supplies from them. - basal rates: 12 am: 0.800 units/h 3 am: 0.875 9:30 am: 0.100 >> 0.500 5 pm: 1.500 6:30 pm: 1.300 11:30 pm: 1.800  - ICR:   12 am: 10 (8 if on prednisone) 6 pm: 8 (6 if on Prednisone) - target: 130-130 except 6-8 am and 6 pm-12 am: 100-100 - ISF: 35 - Insulin on Board: 4h - bolus wizard: On Total daily dose: 30 units per day (up to 40 units for prescription) - extended bolusing: Not using - changes infusion site: 5 to 6 days - Meter: Bayer Contour   Now on: - Tresiba 18 units in am - Novolog: ICR: 1:8 (starches)-1:10 (other foods), but not doing the calc. Correctly - now using carb equivalents Target: 130 Insulin sensitivity factor: 20 If you correct sugars at bedtime, only correct >200 and only give 2 to 3 units. She was previously using Glucophage ER d.a.w. but had to stop because this was not covered by her insurance.  She tried the generic metformin ER and this  caused diarrhea so she had to stop.  Pt checks her sugars 3 times a day: - am:  113-168, 181 >> 131-203 >> 158-269 - 2h after b'fast:  107-148 >> n/c >> n/c - before lunch: 127-286 >> 79, 120-200, 229 >> 177-234 - 2h after lunch: 87-129 >> 120, 141 >> n/c >> 330 - before dinner: 136-209 >> 92-219 >> 119, 132 - 2h after dinner: 118-207, 247 >> 92-236 >> n/c - bedtime: n/c >> 171 >> n/c - nighttime:   92-141, 183 >> n/c >> 69 x 1 >> n/c Lowest sugar was 43 >> .Marland KitchenMarland Kitchen 45 (at night) >> 90 >> 72 (at night) >> 53 (at night); hypoglycemia awareness in the 80s.  No history of hypoglycemia admissions.  She does have a glucagon kit at home. Highest sugar was 400 on Prednisone >> 300s >> 330; no history of DKA admissions.  Pt's meals are: - Breakfast: protein drink + almond milk - Lunch: PB jelly sandwich or sandwich with ham or cream cheese sandwich - Dinner: salads  - Snacks: pretzels; pork tenderloin + veggies; chicken + tenderloin No diet sodas.  She is walking 3x a week, for 30 min.  -No CKD, last BUN/creatinine:  03/07/2019: 18/0.9, glu 250 Lab Results  Component Value Date   BUN 18 10/07/2018   BUN 9 08/29/2018   CREATININE 0.97 10/07/2018   CREATININE 0.85 08/29/2018  On lisinopril. -+ HL;  last set of lipids: Lab Results  Component Value Date   CHOL 155 08/29/2018   HDL 43.30 08/29/2018   LDLCALC 88 08/29/2018   LDLDIRECT 177.0 08/09/2016   TRIG 115.0 08/29/2018   CHOLHDL 4 08/29/2018  On Crestor 5, but she continues to have muscle cramps despite using co-Q10.  Praluent was not covered. - last eye exam was in 10/2018: No DR -No numbness and tingling in her feet.  She was admitted for CP + SOB 10/01/2017.  Cardiac events and PE were ruled out.  She is seeing cardiology (Dr. Rockey Situ).  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  Hypothyroidism: -Due to Hashimoto's thyroiditis -She has a family history of  Graves' disease in her mother -She was initially on Levoxyl 75 mcg daily 1 tab 6/7 days and 1.5 tabs 1/7 days, but she thought Levoxyl increased her lipid levels and caused hair loss, so we changed to Synthroid.  She feels much better on the brand-name.  She is currently on Synthroid d.a.w. 75 mcg 6 out of 7 days and 37.5 mcg 1 of the 7 days.  She takes these: - in am - fasting - at least 30 min from b'fast - no Ca, Fe, PPIs - + MVIs at night - not on Biotin  Reviewed her TFTs: Lab Results  Component Value Date   TSH 1.10 11/06/2018   TSH 0.09 (L) 08/29/2018   TSH 0.92 11/27/2017   TSH 0.30 (L) 10/11/2017   TSH 0.24 (L) 08/11/2017   TSH 0.58 01/04/2017   TSH 0.42 08/09/2016   TSH 1.060 01/11/2016   TSH 2.90 06/08/2015   TSH 0.62 01/29/2015   She also has a history of SLE and Sjogren's syndrome.  We checked her hormones in the setting of hirsutism and acne and they were normal: Component     Latest Ref Rng & Units 09/02/2014 09/08/2014  Testosterone     3 - 41 ng/dL 59 (H)   Testosterone Free     0.0 - 4.2 pg/mL 2.1   FSH     mIU/mL  68.6  LH     mIU/mL  43.4  DHEA-SO4     7 - 177 ug/dL 49   Cortisol, Plasma     ug/dL  1.3 (Normal Dexamethasone suppression test)   Estradiol     pg/mL  24.0   Also: Component     Latest Ref Rng & Units 01/04/2017  Cortisol - AM     mcg/dL 17.6  C206 ACTH     6 - 50 pg/mL 15  No signs of adrenal insufficiency.  ROS: Constitutional: no weight gain/no weight loss, + fatigue, no subjective hyperthermia, no subjective hypothermia Eyes: no blurry vision, no xerophthalmia ENT: no sore throat, no nodules palpated in neck, no dysphagia, no odynophagia, no hoarseness Cardiovascular: no CP/no SOB/no palpitations/no leg swelling Respiratory: no cough/no SOB/no wheezing Gastrointestinal: no N/no V/no D/no C/no acid reflux Musculoskeletal: + muscle aches/+ joint aches Skin: no rashes, + hair loss Neurological: no tremors/no numbness/no  tingling/no dizziness  I reviewed pt's medications, allergies, PMH, social hx, family hx, and changes were documented in the history of present illness. Otherwise, unchanged from my initial visit note..  Past Medical History:  Diagnosis Date  . Allergy   . ANA positive    positive ANA pattern 1 speckled  . Arthritis   . Carotid stenosis, asymptomatic 06/19/2015   1-39% RICA  21-19% LICA rpt 1 yr (12/1738)   . Dermatomyositis ([place])   . Diabetes mellitus without complication (HCC)    Type 1  . Family history of adverse reaction to anesthesia    brothr went into cardiac arrest from anectine  . Fibromyalgia    prior PCP  . GERD (gastroesophageal reflux disease)    prior PCP  . Glaucoma    Narrow angle  . History of blood clots    DVT, in 20s, none since  . History of chicken pox   . History of diverticulitis   . History of pericarditis 1986   with hospitalization  . History of pneumonia 2014  . History of shingles   . History of UTI   . Hyperlipidemia   . Hypertension   . Hypothyroidism   . Mixed connective tissue disease ([place])   . PONV (postoperative nausea and vomiting)   . Raynaud's disease without gangrene   . Shoulder pain left   h/o RTC tendonitis and adhesive capsulitis  . Sjogren's syndrome ([place])   . Sleep apnea    prior PCP - no CPAP for about 10 yrs  . 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[place])   . Vitamin D deficiency    prior PCP   Past Surgical History:  Procedure Laterality Date  . ABDOMINAL HYSTERECTOMY  1978   fibroids and menorrhagia, ovaries remain  . ARTERY BIOPSY Right 04/06/2018   Procedure: BIOPSY TEMPORAL ARTERY RIGHT;  Surgeon: Beverly Gust, MD;  Location: [facility];  Service: ENT;  Laterality: Right;  Diabetic - insulin pump sleep apnea  . [facility] normal per patient  . COLONOSCOPY WITH ESOPHAGOGASTRODUODENOSCOPY (EGD)  03/2007   2 ulcers, benign polyp, rpt 5 yrs ([place], [place])  . PARTIAL HIP  ARTHROPLASTY  2013   Right hip replacement  . TONSILLECTOMY AND ADENOIDECTOMY    . TRANSCAROTID ARTERY REVASCULARIZATION Left 07/23/2018   Procedure: TRANSCAROTID ARTERY REVASCULARIZATION;  Surgeon: Marty Heck, MD;  Location: [facility];  Service: Vascular;  Laterality: Left;  . TUBAL LIGATION    . VAGINAL DELIVERY     x2, no complications   Social History   Social History  . Widowed   . Number of children: 2   Occupational History  Retired    Social History Main Topics  . Smoking status: Never Smoker  . Smokeless tobacco: Never Used  . Alcohol use No  . Drug use: No   Social History Narrative   Lives in [place] now. Recently moved from [place].   No pets.   Mother of Nickayla Mcinnis.   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 aspirin EC 81 MG tablet Take 81 mg by mouth daily.    . BD ULTRA-FINE LANCETS lancets Use as instructed upto 6 times daily 600 each 2  . benzocaine (ORAJEL) 10 % mucosal gel Use as directed 1 application in the mouth or throat 4 (four) times daily as needed for mouth pain.    . chlorpheniramine-HYDROcodone (TUSSIONEX PENNKINETIC ER) 10-8 MG/5ML SUER Take 5 mLs by mouth at bedtime as needed for cough (sedation precautions). 100 mL 0  . clopidogrel (PLAVIX) 75 MG tablet Take  1 tablet (75 mg total) by mouth daily. 30 tablet 6  . diclofenac sodium (VOLTAREN) 1 % GEL Apply 1 application topically 3 (three) times daily. 1 Tube 1  . glucagon (GLUCAGON EMERGENCY) 1 MG injection Inject 1 mg into the muscle once as needed for up to 1 dose. 1 each 12  . glucose blood (CONTOUR NEXT TEST) test strip Use as instructed to check 4 times daily 400 each 5  . insulin aspart  (NOVOLOG) 100 UNIT/ML FlexPen Inject 6-10 Units into the skin 3 (three) times daily with meals. 15 mL 11  . Insulin Degludec (TRESIBA FLEXTOUCH) 200 UNIT/ML SOPN Inject 18 Units into the skin daily. 3 pen 3  . Insulin Pen Needle 32G X 4 MM MISC Use 4-5x a day 300 each 3  . Krill Oil 500 MG CAPS Take 2 capsules (1,000 mg total) by mouth daily. (Patient taking differently: Take 500 mg by mouth daily. )    . lisinopril (ZESTRIL) 10 MG tablet TAKE 1 TABLET BY MOUTH ONCE DAILY 90 tablet 1  . metoprolol succinate (TOPROL-XL) 25 MG 24 hr tablet Take 1 tablet (25 mg total) by mouth daily. 30 tablet 11  . neomycin-polymyxin-dexamethasone (MAXITROL) 0.1 % ophthalmic suspension Place 1 drop into both eyes daily.    . nitroGLYCERIN (NITROSTAT) 0.4 MG SL tablet Place 1 tablet (0.4 mg total) under the tongue every 5 (five) minutes as needed for chest pain. 30 tablet 1  . pantoprazole (PROTONIX) 40 MG tablet Take 1 tablet (40 mg total) by mouth daily. 90 tablet 1  . rosuvastatin (CRESTOR) 10 MG tablet Take 1 tablet (10 mg total) by mouth every other day. 45 tablet 3  . SYNTHROID 75 MCG tablet TAKE 1 TABLET BY MOUTH ONCE DAILY FOR 6 DAYS AND 1 AND 1/2 TABLETS ON THE 7TH DAY (Patient taking differently: Take 37.5-75 mcg by mouth See admin instructions. Take 37.5 mcg by mouth daily on Sunday. Take 75 mcg by mouth daily on all other days.) 90 tablet 3   No current facility-administered medications on file prior to visit.    Allergies  Allergen Reactions  . Iodinated Diagnostic Agents Other (See Comments)    Itching (severe) and chest tightness  . Penicillins Anaphylaxis, Swelling, Rash and Other (See Comments)    Has patient had a PCN reaction causing immediate rash, facial/tongue/throat swelling, SOB or lightheadedness with hypotension: Yes Has patient had a PCN reaction causing severe rash involving mucus membranes or skin necrosis: Unknown Has patient had a PCN reaction that required hospitalization:  Unknown Has patient had a PCN reaction occurring within the last 10 years: No If all of the above answers are "NO", then may proceed with Cephalosporin use.   . Amlodipine Swelling    Pedal edema  . Anectine [Succinylcholine] Other (See Comments)    Brother went into cardiac arrest.  . Codeine Nausea Only  . Gabapentin Other (See Comments)    Gait abnormality  . Influenza Vaccines Other (See Comments)    Muscle weakness; unable to walk  . Nortriptyline Other (See Comments)    Eye swelling and mouth drawed up  . Pamelor [Nortriptyline Hcl] Other (See Comments)    Patient states caused her face to draw in together.  . Valsartan Other (See Comments) and Cough    Allergy to generic only, "Hacking" cough  . Erythromycin Rash and Swelling  . Sulfa Antibiotics Rash   Family History  Problem Relation Age of Onset  . CAD Mother 58  MI, aortic valve issues  . COPD Mother   . Lupus Mother   . Graves' disease Mother   . Rheum arthritis Mother   . CAD Father 66       CABG x2, aortic valve replacement  . Stroke Sister   . Alcohol abuse Brother   . CAD Brother 75       MI  . Stroke Brother   . Seizures Son   . COPD Brother   . CAD Brother 29       stent  . Diabetes Brother   . Depression Grandchild   . Cancer Maternal Aunt        breast  . Breast cancer Maternal Aunt   . Diabetes Sister        deceased  . CAD Sister 81       stents  . Breast cancer Maternal Aunt    PE: BP 132/70   Pulse 85   Ht 5' 3.75" (1.619 m)   Wt 153 lb (69.4 kg)   SpO2 97%   BMI 26.47 kg/m  Wt Readings from Last 3 Encounters:  06/27/19 153 lb (69.4 kg)  06/18/19 153 lb 1 oz (69.4 kg)  05/13/19 155 lb 1 oz (70.3 kg)   Constitutional: normal weight, in NAD Eyes: PERRLA, EOMI, no exophthalmos ENT: moist mucous membranes, no thyromegaly, no cervical lymphadenopathy Cardiovascular: RRR, No MRG Respiratory: CTA B Gastrointestinal: abdomen soft, NT, ND, BS+ Musculoskeletal: no deformities,  strength intact in all 4 Skin: moist, warm, no rashes Neurological: no tremor with outstretched hands, DTR normal in all 4  ASSESSMENT: 1. DM1, uncontrolled, without long-term complications, but with hyperglycemia  2.  Hashimoto's hypothyroidism  3. HL  PLAN:  1. Patient with longstanding, uncontrolled, Lada, previously on insulin pump, but off the pump due to problems with obtaining her supplies from [facility].  She came off the pump before last visit.  She is currently on [place] and NovoLog.  At last visit, sugars were high and more fluctuating compared to before as she was using a completely different NovoLog regimen that recommended.  We changed her insulin sensitivity factor and insulin to carb ratios.  I also advised her not to correct bedtime sugars that were lower than 200 and when she corrected not to use the sliding scale given but only to use 2 to 3 units of insulin, to avoid low blood sugars overnight (she had a 69 after correction of a blood sugar in the 200s).  However, sugars did not improve since last visit. -At this visit, sugars are higher in the morning, but she does not check frequently during the day.  She has 2 checks in the second half of the day in the last 2 weeks and these are at goal.  At this visit, we discussed about possibly needing a higher dose of Tresiba so we will increase the dose from 18 to 20-22 units, since she is accumulating approximately 20 to 25 units of NovoLog per day.  Regarding NovoLog dosing, she is using carbohydrate exchanges (1 exchange is 15 g of carbs) and we discussed about how to use carb amounts, instead.  I think this may improve her postprandial blood sugars.  She tells me that she is not bolusing with lunch as she usually has a small meal plan, possibly only an apple for another fluid.  We discussed that with her diabetes type, she will most likely need to bolus for these meals, also.  This will be easier when  she switches back to the pump, which  I encouraged her to do.  Since she is not checking sugars at night, I advised her to start doing so to see why her sugars are higher in the morning. -We also discussed about possible pumps.  She has an older Medtronic pump and we discussed about starting on the 670 G with integrated CGM.  Another option would be the T:slim pump with Dexcom CGM, which also has the capability of a closed-loop system.  She will check with her insurance and let me know which one is covered. -explained the difference between  LADA and type 2 diabetes - I advised her to:  Patient Instructions  Please try to get the Medtronic 670G + Guardian CGM.  You can also try t:slim x2 + Dexcom G6 CGM.  Please increase: - Tresiba 20-22 units in am - Novolog: ICR: 1:8 (starches)-1:10 (other foods) Target: 130 Insulin sensitivity factor: 20 If you correct sugars at bedtime, only correct >200 and only give 2 to 3 units.  Please bolus with lunch.   Check some sugars at bedtime.  Please return in 3 months with your sugar log.   - we checked her HbA1c: 9.8% (higher) - advised to check sugars at different times of the day - 4x a day, rotating check times - advised for yearly eye exams >> she is UTD - She refuses flu shot, does not usually take it - return to clinic in 3 months     2.  Hashimoto's hypothyroidism - latest thyroid labs reviewed with pt >> normal 10/2018 - she continues on Synthroid d.a.w. 75 mcg 6 out of 7 days, 37.5 mcg 1 out of 7 days - pt feels good on this dose. - we discussed about taking the thyroid hormone every day, with water, >30 minutes before breakfast, separated by >4 hours from acid reflux medications, calcium, iron, multivitamins. Pt. is taking it correctly. -Refilled her Synthroid  3. Hl - Reviewed latest lipid panel from 08/2018: All fractions at goal Lab Results  Component Value Date   CHOL 155 08/29/2018   HDL 43.30 08/29/2018   LDLCALC 88 08/29/2018   LDLDIRECT 177.0 08/09/2016    TRIG 115.0 08/29/2018   CHOLHDL 4 08/29/2018  - Continues Crestor low dose but does have muscle aches with it.  - time spent with the patient: 40 minutes, of which >50% was spent in obtaining information about her diabetes, reviewing her previous labs, evaluations, and insulin doses, counseling her about her condition (please see the discussed topics above), and developing a plan to further  treat it; she had a number of questions which I addressed.   Philemon Kingdom, MD PhD [facility] Endocrinology

## 2019-06-27 NOTE — Patient Instructions (Addendum)
Please try to get the Medtronic 670G + Guardian CGM.  You can also try t:slim x2 + Dexcom G6 CGM.  Please increase: - Tresiba 20-22 units in am - Novolog: ICR: 1:8 (starches)-1:10 (other foods) Target: 130 Insulin sensitivity factor: 20 If you correct sugars at bedtime, only correct >200 and only give 2 to 3 units.  Please bolus with lunch.   Check some sugars at bedtime.  Please return in 3 months with your sugar log.

## 2019-06-27 NOTE — Addendum Note (Signed)
Addended by: Cardell Peach I on: 06/27/2019 11:42 AM   Modules accepted: Orders

## 2019-07-01 ENCOUNTER — Encounter: Payer: Self-pay | Admitting: Family Medicine

## 2019-07-05 ENCOUNTER — Other Ambulatory Visit (INDEPENDENT_AMBULATORY_CARE_PROVIDER_SITE_OTHER): Payer: PPO

## 2019-07-05 ENCOUNTER — Other Ambulatory Visit: Payer: Self-pay

## 2019-07-05 DIAGNOSIS — E139 Other specified diabetes mellitus without complications: Secondary | ICD-10-CM | POA: Diagnosis not present

## 2019-07-06 LAB — GLUCOSE, FASTING: Glucose, Plasma: 275 mg/dL — ABNORMAL HIGH (ref 65–99)

## 2019-07-06 LAB — C-PEPTIDE: C-Peptide: 1.5 ng/mL (ref 0.80–3.85)

## 2019-07-08 ENCOUNTER — Encounter: Payer: Self-pay | Admitting: Internal Medicine

## 2019-07-10 ENCOUNTER — Telehealth: Payer: Self-pay | Admitting: Internal Medicine

## 2019-07-10 NOTE — Telephone Encounter (Signed)
Cassandra Benson with [employer] ph# [phone], ext [phone] called to request C-Peptide results. Please fax the above lab results to fax# [phone]

## 2019-07-10 NOTE — Telephone Encounter (Signed)
This has been faxed.

## 2019-07-15 ENCOUNTER — Telehealth: Payer: Self-pay

## 2019-07-15 MED ORDER — INSULIN ASPART 100 UNIT/ML FLEXPEN: 6.0000 [IU] | PEN_INJECTOR | Freq: Three times a day (TID) | SUBCUTANEOUS | 11 refills | Status: DC

## 2019-07-15 NOTE — Telephone Encounter (Signed)
Refill request

## 2019-07-16 ENCOUNTER — Encounter: Payer: PPO | Attending: Internal Medicine | Admitting: Nutrition

## 2019-07-16 ENCOUNTER — Other Ambulatory Visit: Payer: Self-pay

## 2019-07-16 DIAGNOSIS — E139 Other specified diabetes mellitus without complications: Secondary | ICD-10-CM | POA: Diagnosis not present

## 2019-07-16 NOTE — Patient Instructions (Signed)
Increase dose of Tresiba to 30u Do correction dose of current blood sugar minus 130 divided by 20. Call in 2 weeks with blood sugar readings. Add protein or fat to breakfast meals to slow blood sugar rise downl Call if questions.

## 2019-07-16 NOTE — Progress Notes (Signed)
Patient is here because she is wanting to back on an insulin pump.  Her blood sugars are over 200 most of the time.  Meter was downloaded and given to Dr. Cruzita Lederer to review.   Typical day: 7-8AM: up  Check blood sugar 9-10 AM:  Bfast:  1 toast with jelly, or oatmeal with milk, coffee to drink, or 1 egg, no bread, sometimes fruit with the egg 12-1 Lunch:  Raw veg. With water or unsweet tea.  No insulin for this. 4PM: walk for 30 min. 3-4X/wk 6PM: supper:  4 ounces protein with 30 grams of carb and non starchy veg.  9PM 1 kind bar (15-17 carbs-no insulin) Insulin dose:  Tresiba 26u q AM,  Novolog: 1u/8g ISF:  Minus 140 and divide by 8.   Per Dr. Arman Filter orders,  Pt. Was instructed to increase her Tresiba to 30u and redue correction dose of minus 130 divide by 20.  I reviewed this with her and she reported good understanding of this and had no final questions.  She was shown the different pumps and given brochures on each one.  We discussed the advantages and disadvantages of each model.  She will look them over and decide which pump she wants.

## 2019-07-17 ENCOUNTER — Encounter: Payer: Self-pay | Admitting: Family Medicine

## 2019-07-17 ENCOUNTER — Telehealth: Payer: Self-pay

## 2019-07-17 DIAGNOSIS — E782 Mixed hyperlipidemia: Secondary | ICD-10-CM

## 2019-07-17 DIAGNOSIS — E139 Other specified diabetes mellitus without complications: Secondary | ICD-10-CM

## 2019-07-17 NOTE — Telephone Encounter (Signed)
OK, can you please reorder a fasting glucose and a C-peptide? We need to check them when Glu <180, so please tell her to check that sugars right before having labs just to make sure that she meets this criterion.

## 2019-07-17 NOTE — Telephone Encounter (Signed)
[facility] called to let us know that the recent C-peptide submitted does not qualify for insurance to pay for pumps and supplies. They advise repeating the test.  Please advise.

## 2019-07-18 NOTE — Telephone Encounter (Signed)
It may be later in the afternoon, not in am. Come whenever her sugars is lower than 180.

## 2019-07-18 NOTE — Telephone Encounter (Signed)
Patient called and wanted to relay a message that she is "currently fired up and fighting the pharmacy/insurance company" and wanted Dr Cruzita Lederer to know

## 2019-07-18 NOTE — Telephone Encounter (Signed)
Spoke to patient, she said her fasting glucose is never less than 180. Please advise how to go about this.

## 2019-07-18 NOTE — Telephone Encounter (Signed)
We are aware.  Dr. Cruzita Lederer please advise on how to get her fasting glucose under 180.

## 2019-07-22 ENCOUNTER — Other Ambulatory Visit: Payer: PPO

## 2019-07-22 ENCOUNTER — Other Ambulatory Visit: Payer: Self-pay

## 2019-07-22 ENCOUNTER — Telehealth: Payer: Self-pay | Admitting: Nutrition

## 2019-07-22 DIAGNOSIS — E782 Mixed hyperlipidemia: Secondary | ICD-10-CM | POA: Diagnosis not present

## 2019-07-22 DIAGNOSIS — E139 Other specified diabetes mellitus without complications: Secondary | ICD-10-CM

## 2019-07-22 LAB — BASIC METABOLIC PANEL
BUN: 16 mg/dL (ref 6–23)
CO2: 28 mEq/L (ref 19–32)
Calcium: 9.6 mg/dL (ref 8.4–10.5)
Chloride: 105 mEq/L (ref 96–112)
Creatinine, Ser: 1.1 mg/dL (ref 0.40–1.20)
GFR: 48.16 mL/min — ABNORMAL LOW (ref >60.00)
Glucose, Bld: 212 mg/dL — ABNORMAL HIGH (ref 70–99)
Potassium: 4.4 mEq/L (ref 3.5–5.1)
Sodium: 140 mEq/L (ref 135–145)

## 2019-07-22 NOTE — Telephone Encounter (Signed)
I told her that she did leave her meter her and that it would be up front for her to pick up until 5PM today

## 2019-07-23 ENCOUNTER — Encounter: Payer: Self-pay | Admitting: Internal Medicine

## 2019-07-23 ENCOUNTER — Ambulatory Visit (INDEPENDENT_AMBULATORY_CARE_PROVIDER_SITE_OTHER): Payer: PPO | Admitting: Family Medicine

## 2019-07-23 ENCOUNTER — Encounter: Payer: Self-pay | Admitting: Family Medicine

## 2019-07-23 DIAGNOSIS — E139 Other specified diabetes mellitus without complications: Secondary | ICD-10-CM | POA: Diagnosis not present

## 2019-07-23 LAB — C-PEPTIDE: C-Peptide: 1.46 ng/mL (ref 0.80–3.85)

## 2019-07-23 NOTE — Assessment & Plan Note (Addendum)
Appreciate endo care. Encouraged close f/u. Awaiting C peptide result in appropriate range for approval for insulin pump. Foot exam today.

## 2019-07-23 NOTE — Progress Notes (Signed)
This visit was conducted in person.  BP 126/78 (BP Location: Left Arm, Patient Position: Sitting, Cuff Size: Normal)   Pulse (!) 103   Temp 98.2 F (36.8 C) (Temporal)   Ht 5' 3.75" (1.619 m)   Wt 155 lb 2 oz (70.4 kg)   SpO2 99%   BMI 26.84 kg/m    CC: f/u visit, discuss concerns Subjective:    Patient ID: Cassandra Benson, female    DOB: 28-May-1941, 78 y.o.   MRN: [id number]  HPI: Cassandra Benson is a 78 y.o. female presenting on 07/23/2019 for Elevated Blood Sugar (C/o recent elevated BP readings.  ) and Results (Wants to discuss recent C-peptide results. )   Some elevated BP readings at home associated with R parieto occipital HA. Compliant with toprol XL and lisinopril. BP highest mid day.   DM - followed by endo, saw nutritionist. Persistently high sugars. Having trouble getting approved by insurance for insulin pump and continuous glucose monitor. Continues novolog 6-10u TID as well as tresiba 30-34u daily. Latest C-peptide was too high. Trouble getting sugars under 180. Saw nutritionist/diabetes educator - told diet was actually very good. She walks 15-20 min 3x/wk. Doesn't notice significant sugar changes after walking. Am sugars 180s-250s.  Lab Results  Component Value Date   HGBA1C 9.8 (A) 06/27/2019   .simpefoot      Relevant past medical, surgical, family and social history reviewed and updated as indicated. Interim medical history since our last visit reviewed. Allergies and medications reviewed and updated. Outpatient Medications Prior to Visit  Medication Sig Dispense Refill  . aspirin EC 81 MG tablet Take 81 mg by mouth daily.    . BD ULTRA-FINE LANCETS lancets Use as instructed upto 6 times daily 600 each 2  . benzocaine (ORAJEL) 10 % mucosal gel Use as directed 1 application in the mouth or throat 4 (four) times daily as needed for mouth pain.    Marland Kitchen clopidogrel (PLAVIX) 75 MG tablet Take 1 tablet (75 mg total) by mouth daily. 30 tablet 6  . diclofenac sodium  (VOLTAREN) 1 % GEL Apply 1 application topically 3 (three) times daily. 1 Tube 1  . glucagon (GLUCAGON EMERGENCY) 1 MG injection Inject 1 mg into the muscle once as needed for up to 1 dose. 1 each 12  . glucose blood (CONTOUR NEXT TEST) test strip Use as instructed to check 4 times daily 400 each 5  . insulin aspart (NOVOLOG) 100 UNIT/ML FlexPen Inject 6-10 Units into the skin 3 (three) times daily with meals. 15 mL 11  . Insulin Degludec (TRESIBA FLEXTOUCH) 200 UNIT/ML SOPN Inject 30 Units into the skin daily.    . Insulin Pen Needle 32G X 4 MM MISC Use 4-5x a day 300 each 3  . Krill Oil 500 MG CAPS Take 2 capsules (1,000 mg total) by mouth daily. (Patient taking differently: Take 500 mg by mouth daily. )    . lisinopril (ZESTRIL) 10 MG tablet TAKE 1 TABLET BY MOUTH ONCE DAILY 90 tablet 1  . metoprolol succinate (TOPROL-XL) 25 MG 24 hr tablet Take 1 tablet (25 mg total) by mouth daily. 30 tablet 11  . neomycin-polymyxin-dexamethasone (MAXITROL) 0.1 % ophthalmic suspension Place 1 drop into both eyes daily.    . nitroGLYCERIN (NITROSTAT) 0.4 MG SL tablet Place 1 tablet (0.4 mg total) under the tongue every 5 (five) minutes as needed for chest pain. 30 tablet 1  . pantoprazole (PROTONIX) 40 MG tablet Take 1 tablet (40 mg total)  by mouth daily. 90 tablet 1  . rosuvastatin (CRESTOR) 10 MG tablet Take 1 tablet (10 mg total) by mouth every other day. 45 tablet 3  . SYNTHROID 75 MCG tablet TAKE 1 TABLET BY MOUTH ONCE DAILY FOR 6 DAYS AND 1 AND 1/2 TABLETS ON THE 7TH DAY 90 tablet 3  . chlorpheniramine-HYDROcodone (TUSSIONEX PENNKINETIC ER) 10-8 MG/5ML SUER Take 5 mLs by mouth at bedtime as needed for cough (sedation precautions). 100 mL 0  . Insulin Degludec (TRESIBA FLEXTOUCH) 200 UNIT/ML SOPN Inject 18 Units into the skin daily. 3 pen 3   No facility-administered medications prior to visit.      Per HPI unless specifically indicated in ROS section below Review of Systems Objective:    BP 126/78  (BP Location: Left Arm, Patient Position: Sitting, Cuff Size: Normal)   Pulse (!) 103   Temp 98.2 F (36.8 C) (Temporal)   Ht 5' 3.75" (1.619 m)   Wt 155 lb 2 oz (70.4 kg)   SpO2 99%   BMI 26.84 kg/m   Wt Readings from Last 3 Encounters:  07/23/19 155 lb 2 oz (70.4 kg)  06/27/19 153 lb (69.4 kg)  06/18/19 153 lb 1 oz (69.4 kg)    Physical Exam Vitals signs and nursing note reviewed.  Constitutional:      General: She is not in acute distress.    Appearance: Normal appearance. She is well-developed. She is not ill-appearing.  HENT:     Head: Normocephalic and atraumatic.     Right Ear: External ear normal.     Left Ear: External ear normal.     Nose: Nose normal.     Mouth/Throat:     Pharynx: No oropharyngeal exudate.  Eyes:     General: No scleral icterus.    Conjunctiva/sclera: Conjunctivae normal.     Pupils: Pupils are equal, round, and reactive to light.  Neck:     Musculoskeletal: Normal range of motion and neck supple.  Cardiovascular:     Rate and Rhythm: Normal rate and regular rhythm.     Heart sounds: Normal heart sounds. No murmur.  Pulmonary:     Effort: Pulmonary effort is normal. No respiratory distress.     Breath sounds: Normal breath sounds. No wheezing or rales.  Musculoskeletal:     Comments: See HPI for foot exam if done  Lymphadenopathy:     Cervical: No cervical adenopathy.  Skin:    General: Skin is warm and dry.     Findings: No rash.  Neurological:     Mental Status: She is alert.       Results for orders placed or performed in visit on 07/22/19  C-peptide  Result Value Ref Range   C-Peptide 1.46 0.80 - 3.85 ng/mL  Basic Metabolic Panel (BMET)  Result Value Ref Range   Sodium 140 135 - 145 mEq/L   Potassium 4.4 3.5 - 5.1 mEq/L   Chloride 105 96 - 112 mEq/L   CO2 28 19 - 32 mEq/L   Glucose, Bld 212 (H) 70 - 99 mg/dL   BUN 16 6 - 23 mg/dL   Creatinine, Ser 1.10 0.40 - 1.20 mg/dL   Calcium 9.6 8.4 - 10.5 mg/dL   GFR 48.16 (L)  >60.00 mL/min   Assessment & Plan:   Problem List Items Addressed This Visit    LADA (latent autoimmune diabetes in adults), managed as type 1 ([place])    Appreciate endo care. Encouraged close f/u. Awaiting C peptide result in  appropriate range for approval for insulin pump. Foot exam today.       Relevant Medications   Insulin Degludec (TRESIBA FLEXTOUCH) 200 UNIT/ML SOPN       No orders of the defined types were placed in this encounter.  No orders of the defined types were placed in this encounter.   Follow up plan: No follow-ups on file.  [name], MD

## 2019-07-23 NOTE — Patient Instructions (Signed)
Good to see you today. Continue healthy diet, continue regular walking routine.  Return to Dr Renne Crigler when sugar is under 180 for repeat blood test.

## 2019-08-07 ENCOUNTER — Other Ambulatory Visit: Payer: Self-pay

## 2019-08-07 MED ORDER — LISINOPRIL 10 MG PO TABS: 10.0000 mg | ORAL_TABLET | Freq: Every day | ORAL | 0 refills | Status: DC

## 2019-08-07 NOTE — Telephone Encounter (Signed)
E-scribed refill 

## 2019-08-11 ENCOUNTER — Other Ambulatory Visit: Payer: Self-pay | Admitting: Vascular Surgery

## 2019-08-13 ENCOUNTER — Other Ambulatory Visit: Payer: Self-pay | Admitting: Vascular Surgery

## 2019-08-30 ENCOUNTER — Other Ambulatory Visit: Payer: Self-pay

## 2019-08-30 DIAGNOSIS — I6522 Occlusion and stenosis of left carotid artery: Secondary | ICD-10-CM

## 2019-08-30 DIAGNOSIS — E109 Type 1 diabetes mellitus without complications: Secondary | ICD-10-CM | POA: Diagnosis not present

## 2019-09-01 ENCOUNTER — Encounter: Payer: Self-pay | Admitting: Family Medicine

## 2019-09-03 ENCOUNTER — Other Ambulatory Visit: Payer: Self-pay

## 2019-09-03 ENCOUNTER — Other Ambulatory Visit: Payer: Self-pay | Admitting: *Deleted

## 2019-09-03 ENCOUNTER — Ambulatory Visit (HOSPITAL_COMMUNITY)
Admission: RE | Admit: 2019-09-03 | Discharge: 2019-09-03 | Disposition: A | Discharge status: Home / Self Care | Payer: PPO | Source: Ambulatory Visit | Attending: Family | Admitting: Family

## 2019-09-03 ENCOUNTER — Ambulatory Visit: Payer: PPO | Admitting: Vascular Surgery

## 2019-09-03 ENCOUNTER — Encounter: Payer: Self-pay | Admitting: Vascular Surgery

## 2019-09-03 VITALS — BP 123/70 | HR 64 | Temp 97.3°F | Resp 14 | Ht 63.0 in | Wt 154.0 lb

## 2019-09-03 DIAGNOSIS — I6522 Occlusion and stenosis of left carotid artery: Secondary | ICD-10-CM | POA: Diagnosis not present

## 2019-09-03 MED ORDER — CLOPIDOGREL BISULFATE 75 MG PO TABS: 75.0000 mg | ORAL_TABLET | Freq: Every day | ORAL | 6 refills | Status: DC

## 2019-09-03 MED ORDER — ROSUVASTATIN CALCIUM 10 MG PO TABS: 10.0000 mg | ORAL_TABLET | Freq: Every day | ORAL | 5 refills | Status: DC

## 2019-09-03 NOTE — Patient Outreach (Signed)
[facility]) Care Management  09/03/2019  Cassandra Benson May 14, 1941 [id number]   Case reviewed, no patient outreach needed,  and case closed per [name], Assistant Clinical Director at [facility]  request.   [name]. [name], BSN, [place] Management [facility] Telephonic CM Phone: [phone] Fax: [phone]

## 2019-09-03 NOTE — Progress Notes (Signed)
Patient name: Cassandra Benson MRN: [id number] DOB: 1941/08/20 Sex: female  REASON FOR VISIT: 6 month follow-up status post left TCAR  HPI: Cassandra Benson is a 78 y.o. 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  Remains on aspirin and Plavix.  She has had no new neurologic events in the last 6 months.  Her neck incision has healed without issue.  Her other complaint today she does have lower extremity leg swelling worse on the right.  No pain.  No ulceration.  No heaviness.  Past Medical History:  Diagnosis Date  . Allergy   . ANA positive    positive ANA pattern 1 speckled  . Arthritis   . Carotid stenosis, asymptomatic 06/19/2015   123456 RICA 123456 LICA rpt 1 yr ([id number])   . Dermatomyositis ([place])   . Diabetes mellitus without complication (HCC)    Type 1  . Family history of adverse reaction to anesthesia    brothr went into cardiac arrest from anectine  . Fibromyalgia    prior PCP  . GERD (gastroesophageal reflux disease)    prior PCP  . Glaucoma    Narrow angle  . History of blood clots    DVT, in 20s, none since  . History of chicken pox   . History of diverticulitis   . History of pericarditis 1986   with hospitalization  . History of pneumonia 2014  . History of shingles   . History of UTI   . Hyperlipidemia   . Hypertension   . Hypothyroidism   . Mixed connective tissue disease ([place])   . PONV (postoperative nausea and vomiting)   . Raynaud's disease without gangrene   . Shoulder pain left   h/o RTC tendonitis and adhesive capsulitis  . Sjogren's syndrome ([place])   . Sleep apnea    prior PCP - no CPAP for about 10 yrs  . 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[place])   . Vitamin D deficiency    prior PCP    Past Surgical History:  Procedure Laterality Date  . ABDOMINAL HYSTERECTOMY  1978   fibroids and menorrhagia, ovaries remain  . ARTERY BIOPSY Right 04/06/2018   Procedure: BIOPSY TEMPORAL ARTERY  RIGHT;  Surgeon: Beverly Gust, MD;  Location: [facility];  Service: ENT;  Laterality: Right;  Diabetic - insulin pump sleep apnea  . [facility] normal per patient  . COLONOSCOPY WITH ESOPHAGOGASTRODUODENOSCOPY (EGD)  03/2007   2 ulcers, benign polyp, rpt 5 yrs ([place], [place])  . PARTIAL HIP ARTHROPLASTY  2013   Right hip replacement  . TONSILLECTOMY AND ADENOIDECTOMY    . TRANSCAROTID ARTERY REVASCULARIZATION Left 07/23/2018   Procedure: TRANSCAROTID ARTERY REVASCULARIZATION;  Surgeon: Marty Heck, MD;  Location: [facility];  Service: Vascular;  Laterality: Left;  . TUBAL LIGATION    . VAGINAL DELIVERY     x2, no complications    Family History  Problem Relation Age of Onset  . CAD Mother 27       MI, aortic valve issues  . COPD Mother   . Lupus Mother   . Graves' disease Mother   . Rheum arthritis Mother   . CAD Father 84       CABG x2, aortic valve replacement  . Stroke Sister   . Alcohol abuse Brother   . CAD Brother 27       MI  . Stroke Brother   .  Seizures Son   . COPD Brother   . CAD Brother 3       stent  . Diabetes Brother   . Depression Grandchild   . Cancer Maternal Aunt        breast  . Breast cancer Maternal Aunt   . Diabetes Sister        deceased  . CAD Sister 47       stents  . Breast cancer Maternal Aunt     SOCIAL HISTORY: Social History   Tobacco Use  . Smoking status: Never Smoker  . Smokeless tobacco: Never Used  Substance Use Topics  . Alcohol use: No    Allergies  Allergen Reactions  . Iodinated Diagnostic Agents Other (See Comments)    Itching (severe) and chest tightness  . Penicillins Anaphylaxis, Swelling, Rash and Other (See Comments)    Has patient had a PCN reaction causing immediate rash, facial/tongue/throat swelling, SOB or lightheadedness with hypotension: Yes Has patient had a PCN reaction causing severe rash involving mucus membranes or skin necrosis:  Unknown Has patient had a PCN reaction that required hospitalization: Unknown Has patient had a PCN reaction occurring within the last 10 years: No If all of the above answers are "NO", then may proceed with Cephalosporin use.   . Amlodipine Swelling    Pedal edema  . Anectine [Succinylcholine] Other (See Comments)    Brother went into cardiac arrest.  . Codeine Nausea Only  . Gabapentin Other (See Comments)    Gait abnormality  . Influenza Vaccines Other (See Comments)    Muscle weakness; unable to walk  . Nortriptyline Other (See Comments)    Eye swelling and mouth drawed up  . Pamelor [Nortriptyline Hcl] Other (See Comments)    Patient states caused her face to draw in together.  . Valsartan Other (See Comments) and Cough    Allergy to generic only, "Hacking" cough  . Erythromycin Rash and Swelling  . Sulfa Antibiotics Rash    Current Outpatient Medications  Medication Sig Dispense Refill  . aspirin EC 81 MG tablet Take 81 mg by mouth daily.    . BD ULTRA-FINE LANCETS lancets Use as instructed upto 6 times daily 600 each 2  . benzocaine (ORAJEL) 10 % mucosal gel Use as directed 1 application in the mouth or throat 4 (four) times daily as needed for mouth pain.    Marland Kitchen clopidogrel (PLAVIX) 75 MG tablet Take 1 tablet (75 mg total) by mouth daily. 30 tablet 6  . diclofenac sodium (VOLTAREN) 1 % GEL Apply 1 application topically 3 (three) times daily. 1 Tube 1  . glucagon (GLUCAGON EMERGENCY) 1 MG injection Inject 1 mg into the muscle once as needed for up to 1 dose. 1 each 12  . glucose blood (CONTOUR NEXT TEST) test strip Use as instructed to check 4 times daily 400 each 5  . insulin aspart (NOVOLOG) 100 UNIT/ML FlexPen Inject 6-10 Units into the skin 3 (three) times daily with meals. 15 mL 11  . Insulin Degludec (TRESIBA FLEXTOUCH) 200 UNIT/ML SOPN Inject 30 Units into the skin daily.    . Insulin Pen Needle 32G X 4 MM MISC Use 4-5x a day 300 each 3  . Krill Oil 500 MG CAPS Take 2  capsules (1,000 mg total) by mouth daily. (Patient taking differently: Take 500 mg by mouth daily. )    . lisinopril (ZESTRIL) 10 MG tablet Take 1 tablet (10 mg total) by mouth daily. 90 tablet 0  .  metoprolol succinate (TOPROL-XL) 25 MG 24 hr tablet Take 1 tablet (25 mg total) by mouth daily. 30 tablet 11  . neomycin-polymyxin-dexamethasone (MAXITROL) 0.1 % ophthalmic suspension Place 1 drop into both eyes daily.    . nitroGLYCERIN (NITROSTAT) 0.4 MG SL tablet Place 1 tablet (0.4 mg total) under the tongue every 5 (five) minutes as needed for chest pain. 30 tablet 1  . pantoprazole (PROTONIX) 40 MG tablet Take 1 tablet (40 mg total) by mouth daily. 90 tablet 1  . rosuvastatin (CRESTOR) 10 MG tablet Take 1 tablet (10 mg total) by mouth daily. 30 tablet 5  . SYNTHROID 75 MCG tablet TAKE 1 TABLET BY MOUTH ONCE DAILY FOR 6 DAYS AND 1 AND 1/2 TABLETS ON THE 7TH DAY 90 tablet 3   No current facility-administered medications for this visit.     REVIEW OF SYSTEMS:  [X]  denotes positive finding, [ ]  denotes negative finding Cardiac  Comments:  Chest pain or chest pressure:    Shortness of breath upon exertion:    Short of breath when lying flat:    Irregular heart rhythm:        Vascular    Pain in calf, thigh, or hip brought on by ambulation:    Pain in feet at night that wakes you up from your sleep:     Blood clot in your veins:    Leg swelling:         Pulmonary    Oxygen at home:    Productive cough:     Wheezing:         Neurologic    Sudden weakness in arms or legs:     Sudden numbness in arms or legs:     Sudden onset of difficulty speaking or slurred speech:    Temporary loss of vision in one eye:     Problems with dizziness:         Gastrointestinal    Blood in stool:     Vomited blood:         Genitourinary    Burning when urinating:     Blood in urine:        Psychiatric    Major depression:         Hematologic    Bleeding problems:    Problems with blood  clotting too easily:        Skin    Rashes or ulcers:        Constitutional    Fever or chills:      PHYSICAL EXAM: Vitals:   09/03/19 1048 09/03/19 1053  BP: 112/60 123/70  Pulse: 64 64  Resp: 14   Temp: (!) 97.3 F (36.3 C)   TempSrc: Temporal   SpO2: 98%   Weight: 154 lb (69.9 kg)   Height: 5\' 3"  (1.6 m)     GENERAL: The patient is a well-nourished female, in no acute distress. The vital signs are documented above. CARDIAC: There is a regular rate and rhythm.  VASCULAR:  Left neck incision well healed. PULMONARY: There is good air exchange bilaterally without wheezing or rales. NEUROLOGIC: No focal weakness or paresthesias are detected.  CN II-XII grossly intact.  DATA:   I independently reviewed her carotid duplex today and she has widely patent left carotid stent.  Her right ICA also has minimal disease.  Assessment/Plan:  78 year old female status post left TCAR on 07/23/2018 for asymptomatic high-grade stenosis.  She continues to do well and remains asymptomatic from her carotid  disease.  Her left carotid stent is widely patent on duplex today.  She has minimal right carotid disease.  Discussed I will see her back in one year with repeat carotid duplex.  I did refill her Plavix as well as her statin prescription.  Other complaint today is lower extremity leg swelling worse on the right.  She does have spider veins on exam and one varicose vein in the left leg.  I discussed that we could get a venous reflux study for complete evaluation but if she wanted to treat this conservatively we can just plan for leg elevation and compression socks for now.  We did size her for knee-high compression and we will get that arranged.   Marty Heck, MD [facility]: [phone] Pager: [place]

## 2019-09-09 ENCOUNTER — Other Ambulatory Visit: Payer: Self-pay | Admitting: Family Medicine

## 2019-09-09 DIAGNOSIS — E038 Other specified hypothyroidism: Secondary | ICD-10-CM

## 2019-09-09 DIAGNOSIS — E139 Other specified diabetes mellitus without complications: Secondary | ICD-10-CM

## 2019-09-09 DIAGNOSIS — E782 Mixed hyperlipidemia: Secondary | ICD-10-CM

## 2019-09-09 DIAGNOSIS — E559 Vitamin D deficiency, unspecified: Secondary | ICD-10-CM

## 2019-09-09 DIAGNOSIS — K760 Fatty (change of) liver, not elsewhere classified: Secondary | ICD-10-CM

## 2019-09-09 DIAGNOSIS — E063 Autoimmune thyroiditis: Secondary | ICD-10-CM

## 2019-09-09 DIAGNOSIS — M3500 Sicca syndrome, unspecified: Secondary | ICD-10-CM

## 2019-09-10 ENCOUNTER — Telehealth: Payer: Self-pay | Admitting: Nutrition

## 2019-09-10 ENCOUNTER — Other Ambulatory Visit (INDEPENDENT_AMBULATORY_CARE_PROVIDER_SITE_OTHER): Payer: PPO

## 2019-09-10 ENCOUNTER — Other Ambulatory Visit: Payer: Self-pay

## 2019-09-10 ENCOUNTER — Ambulatory Visit: Payer: PPO

## 2019-09-10 DIAGNOSIS — K760 Fatty (change of) liver, not elsewhere classified: Secondary | ICD-10-CM | POA: Diagnosis not present

## 2019-09-10 DIAGNOSIS — M3500 Sicca syndrome, unspecified: Secondary | ICD-10-CM | POA: Diagnosis not present

## 2019-09-10 DIAGNOSIS — E038 Other specified hypothyroidism: Secondary | ICD-10-CM | POA: Diagnosis not present

## 2019-09-10 DIAGNOSIS — E559 Vitamin D deficiency, unspecified: Secondary | ICD-10-CM | POA: Diagnosis not present

## 2019-09-10 DIAGNOSIS — E782 Mixed hyperlipidemia: Secondary | ICD-10-CM

## 2019-09-10 DIAGNOSIS — E063 Autoimmune thyroiditis: Secondary | ICD-10-CM

## 2019-09-10 DIAGNOSIS — E139 Other specified diabetes mellitus without complications: Secondary | ICD-10-CM | POA: Diagnosis not present

## 2019-09-10 LAB — COMPREHENSIVE METABOLIC PANEL
ALT: 10 U/L (ref 0–35)
AST: 12 U/L (ref 0–37)
Albumin: 4 g/dL (ref 3.5–5.2)
Alkaline Phosphatase: 90 U/L (ref 39–117)
BUN: 15 mg/dL (ref 6–23)
CO2: 26 mEq/L (ref 19–32)
Calcium: 9.7 mg/dL (ref 8.4–10.5)
Chloride: 105 mEq/L (ref 96–112)
Creatinine, Ser: 1.06 mg/dL (ref 0.40–1.20)
GFR: 50.24 mL/min — ABNORMAL LOW (ref >60.00)
Glucose, Bld: 225 mg/dL — ABNORMAL HIGH (ref 70–99)
Potassium: 4.1 mEq/L (ref 3.5–5.1)
Sodium: 139 mEq/L (ref 135–145)
Total Bilirubin: 0.4 mg/dL (ref 0.2–1.2)
Total Protein: 6.2 g/dL (ref 6.0–8.3)

## 2019-09-10 LAB — LIPID PANEL
Cholesterol: 212 mg/dL — ABNORMAL HIGH (ref 0–200)
HDL: 40.4 mg/dL (ref >39.00)
LDL Cholesterol: 141 mg/dL — ABNORMAL HIGH (ref 0–99)
NonHDL: 171.68
Total CHOL/HDL Ratio: 5
Triglycerides: 153 mg/dL — ABNORMAL HIGH (ref 0.0–149.0)
VLDL: 30.6 mg/dL (ref 0.0–40.0)

## 2019-09-10 LAB — CBC WITH DIFFERENTIAL/PLATELET
Basophils Absolute: 0.1 10*3/uL (ref 0.0–0.1)
Basophils Relative: 1.3 % (ref 0.0–3.0)
Eosinophils Absolute: 0.2 10*3/uL (ref 0.0–0.7)
Eosinophils Relative: 1.8 % (ref 0.0–5.0)
HCT: 41.1 % (ref 36.0–46.0)
Hemoglobin: 13.6 g/dL (ref 12.0–15.0)
Lymphocytes Relative: 26.7 % (ref 12.0–46.0)
Lymphs Abs: 2.4 10*3/uL (ref 0.7–4.0)
MCHC: 33.1 g/dL (ref 30.0–36.0)
MCV: 80.9 fl (ref 78.0–100.0)
Monocytes Absolute: 0.7 10*3/uL (ref 0.1–1.0)
Monocytes Relative: 7.7 % (ref 3.0–12.0)
Neutro Abs: 5.5 10*3/uL (ref 1.4–7.7)
Neutrophils Relative %: 62.5 % (ref 43.0–77.0)
Platelets: 236 10*3/uL (ref 150.0–400.0)
RBC: 5.08 Mil/uL (ref 3.87–5.11)
RDW: 14 % (ref 11.5–15.5)
WBC: 8.8 10*3/uL (ref 4.0–10.5)

## 2019-09-10 LAB — VITAMIN D 25 HYDROXY (VIT D DEFICIENCY, FRACTURES): VITD: 32.72 ng/mL (ref 30.00–100.00)

## 2019-09-10 LAB — T4, FREE: Free T4: 0.98 ng/dL (ref 0.60–1.60)

## 2019-09-10 LAB — TSH: TSH: 0.22 u[IU]/mL — ABNORMAL LOW (ref 0.35–4.50)

## 2019-09-10 LAB — HEMOGLOBIN A1C: Hgb A1c MFr Bld: 9.7 % — ABNORMAL HIGH (ref 4.6–6.5)

## 2019-09-13 ENCOUNTER — Other Ambulatory Visit: Payer: Self-pay

## 2019-09-13 ENCOUNTER — Telehealth: Payer: Self-pay

## 2019-09-13 ENCOUNTER — Encounter: Payer: Self-pay | Admitting: Family Medicine

## 2019-09-13 ENCOUNTER — Ambulatory Visit (INDEPENDENT_AMBULATORY_CARE_PROVIDER_SITE_OTHER): Payer: PPO | Admitting: Family Medicine

## 2019-09-13 VITALS — BP 132/66 | HR 97 | Temp 97.9°F | Ht 63.5 in | Wt 155.5 lb

## 2019-09-13 DIAGNOSIS — L989 Disorder of the skin and subcutaneous tissue, unspecified: Secondary | ICD-10-CM

## 2019-09-13 DIAGNOSIS — E038 Other specified hypothyroidism: Secondary | ICD-10-CM

## 2019-09-13 DIAGNOSIS — Z0000 Encounter for general adult medical examination without abnormal findings: Secondary | ICD-10-CM | POA: Diagnosis not present

## 2019-09-13 DIAGNOSIS — G459 Transient cerebral ischemic attack, unspecified: Secondary | ICD-10-CM

## 2019-09-13 DIAGNOSIS — I6522 Occlusion and stenosis of left carotid artery: Secondary | ICD-10-CM

## 2019-09-13 DIAGNOSIS — M79669 Pain in unspecified lower leg: Secondary | ICD-10-CM

## 2019-09-13 DIAGNOSIS — E139 Other specified diabetes mellitus without complications: Secondary | ICD-10-CM

## 2019-09-13 DIAGNOSIS — E785 Hyperlipidemia, unspecified: Secondary | ICD-10-CM

## 2019-09-13 DIAGNOSIS — I739 Peripheral vascular disease, unspecified: Secondary | ICD-10-CM

## 2019-09-13 DIAGNOSIS — M3500 Sicca syndrome, unspecified: Secondary | ICD-10-CM

## 2019-09-13 DIAGNOSIS — I10 Essential (primary) hypertension: Secondary | ICD-10-CM

## 2019-09-13 DIAGNOSIS — N289 Disorder of kidney and ureter, unspecified: Secondary | ICD-10-CM

## 2019-09-13 DIAGNOSIS — E1169 Type 2 diabetes mellitus with other specified complication: Secondary | ICD-10-CM

## 2019-09-13 DIAGNOSIS — E559 Vitamin D deficiency, unspecified: Secondary | ICD-10-CM

## 2019-09-13 DIAGNOSIS — K760 Fatty (change of) liver, not elsewhere classified: Secondary | ICD-10-CM

## 2019-09-13 DIAGNOSIS — R519 Headache, unspecified: Secondary | ICD-10-CM

## 2019-09-13 DIAGNOSIS — Z7189 Other specified counseling: Secondary | ICD-10-CM

## 2019-09-13 MED ORDER — SYNTHROID 75 MCG PO TABS: ORAL_TABLET | ORAL | 3 refills | Status: DC

## 2019-09-13 NOTE — Progress Notes (Signed)
This visit was conducted in person.  BP 132/66 (BP Location: Left Arm, Patient Position: Sitting, Cuff Size: Normal)   Pulse 97   Temp 97.9 F (36.6 C) (Temporal)   Ht 5' 3.5" (1.613 m)   Wt 155 lb 8 oz (70.5 kg)   SpO2 97%   BMI 27.11 kg/m    CC: CPE/AMW Subjective:    Patient ID: Cassandra Benson, female    DOB: [birth year], 78 y.o.   MRN: [id number]  HPI: NAIYELI ROTELLA is a 78 y.o. female presenting on 09/13/2019 for Medicare Wellness   Did not see health advisor this year.   Hearing Screening   125Hz  250Hz  500Hz  1000Hz  2000Hz  3000Hz  4000Hz  6000Hz  8000Hz   Right ear:   20 20 20  20     Left ear:   20 25 40  40    Vision Screening Comments: Last eye exam, 05/2019.    Office Visit from 09/13/2019 in [place] at [facility] Total Score  0      Fall Risk  09/13/2019 08/29/2018 08/21/2017 08/09/2016 06/19/2015  Falls in the past year? 1 0 No No No  Number falls in past yr: 0 - - - -  Injury with Fall? 1 - - - -     Had L carotid stent placed with angioplasty 2019. She has new insulin pump! Needs to get this programmed.  Overall adequate BP readings based on log through mychart.  Presumed R piriformis syndrome after fall onto R buttock last visit.   Preventative: Colon cancer screening - normal 09/2012 per prior records, good for 10 yrs [facility]) - we will again request records today for report. Breast cancer screening - mammo 02/2019 Birads1. Does self breast exams at home. Well woman exam - last 2013 s/p abd hysterectomy, ovaries remain.  DEXA scan - normal per patient 04/2012. Flu shot - ALLERGIC Tdap 07/2012.  Pneumovax 2014, prevnar 2015.  Shingrix - declines. Has had shingles.  Advanced directive: forms were scanned (08/2017) - HCPOA are son Cassandra Benson and Cassandra Benson. Does not want prolonged life support if terminal condition. HCPOA has authority to override directives.  Seat belt use discussed. Sunscreen use discussed, wants spot  on R cheek and leg checked Non smoker Alcohol - none Dentist q4 mo - rec more frequent due to sjogren's Eye exam yearly  Bowel - no constipation  Bladder - some stress incontinence symptoms  Lives in [place] now. Recently moved from [place].Husbanddeceased mid 2017.No pets. 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 Diet - good water, fruits/vegetables daily, limited meat, protein drink every morning     Relevant past medical, surgical, family and social history reviewed and updated as indicated. Interim medical history since our last visit reviewed. Allergies and medications reviewed and updated. Outpatient Medications Prior to Visit  Medication Sig Dispense Refill  . aspirin EC 81 MG tablet Take 81 mg by mouth daily.    . BD ULTRA-FINE LANCETS lancets Use as instructed upto 6 times daily 600 each 2  . benzocaine (ORAJEL) 10 % mucosal gel Use as directed 1 application in the mouth or throat 4 (four) times daily as needed for mouth pain.    Marland Kitchen clopidogrel (PLAVIX) 75 MG tablet Take 1 tablet (75 mg total) by mouth daily. 30 tablet 6  . diclofenac sodium (VOLTAREN)  1 % GEL Apply 1 application topically 3 (three) times daily. 1 Tube 1  . glucagon (GLUCAGON EMERGENCY) 1 MG injection Inject 1 mg into the muscle once as needed for up to 1 dose. 1 each 12  . glucose blood (CONTOUR NEXT TEST) test strip Use as instructed to check 4 times daily 400 each 5  . insulin aspart (NOVOLOG) 100 UNIT/ML FlexPen Inject 6-10 Units into the skin 3 (three) times daily with meals. 15 mL 11  . Insulin Degludec (TRESIBA FLEXTOUCH) 200 UNIT/ML SOPN Inject 30 Units into the skin daily.    . Insulin Pen Needle 32G X 4 MM MISC Use 4-5x a day 300 each 3  . Krill Oil 500 MG CAPS Take 2 capsules (1,000 mg total) by mouth daily. (Patient taking  differently: Take 500 mg by mouth daily. )    . lisinopril (ZESTRIL) 10 MG tablet Take 1 tablet (10 mg total) by mouth daily. 90 tablet 0  . metoprolol succinate (TOPROL-XL) 25 MG 24 hr tablet Take 1 tablet (25 mg total) by mouth daily. 30 tablet 11  . neomycin-polymyxin-dexamethasone (MAXITROL) 0.1 % ophthalmic suspension Place 1 drop into both eyes daily.    . nitroGLYCERIN (NITROSTAT) 0.4 MG SL tablet Place 1 tablet (0.4 mg total) under the tongue every 5 (five) minutes as needed for chest pain. 30 tablet 1  . pantoprazole (PROTONIX) 40 MG tablet Take 1 tablet (40 mg total) by mouth daily. 90 tablet 1  . rosuvastatin (CRESTOR) 10 MG tablet Take 1 tablet (10 mg total) by mouth daily. 30 tablet 5  . SYNTHROID 75 MCG tablet TAKE 1 TABLET BY MOUTH ONCE DAILY FOR 6 DAYS AND 1 AND 1/2 TABLETS ON THE 7TH DAY 90 tablet 3   No facility-administered medications prior to visit.     Per HPI unless specifically indicated in ROS section below Review of Systems  Constitutional: Negative for activity change, appetite change, chills, fatigue, fever and unexpected weight change.  HENT: Negative for hearing loss.   Eyes: Negative for visual disturbance.  Respiratory: Positive for shortness of breath (exertional). Negative for cough, chest tightness and wheezing.   Cardiovascular: Positive for chest pain (chest wall pain - autoimmune MSK etiology) and palpitations. Negative for leg swelling.  Gastrointestinal: Negative for abdominal distention, abdominal pain, blood in stool, constipation, diarrhea, nausea and vomiting.  Genitourinary: Negative for difficulty urinating and hematuria.  Musculoskeletal: Negative for arthralgias, myalgias and neck pain.  Skin: Negative for rash.  Neurological: Positive for headaches. Negative for dizziness, seizures and syncope.  Hematological: Negative for adenopathy. Does not bruise/bleed easily.  Psychiatric/Behavioral: Negative for dysphoric mood. The patient is not  nervous/anxious.    Objective:    BP 132/66 (BP Location: Left Arm, Patient Position: Sitting, Cuff Size: Normal)   Pulse 97   Temp 97.9 F (36.6 C) (Temporal)   Ht 5' 3.5" (1.613 m)   Wt 155 lb 8 oz (70.5 kg)   SpO2 97%   BMI 27.11 kg/m   Wt Readings from Last 3 Encounters:  09/13/19 155 lb 8 oz (70.5 kg)  09/03/19 154 lb (69.9 kg)  07/23/19 155 lb 2 oz (70.4 kg)    Physical Exam Vitals and nursing note reviewed.  Constitutional:      General: She is not in acute distress.    Appearance: Normal appearance. She is well-developed. She is not ill-appearing.  HENT:     Head: Normocephalic and atraumatic.     Right Ear: Hearing, tympanic membrane, ear canal  and external ear normal.     Left Ear: Hearing, tympanic membrane, ear canal and external ear normal.     Nose: Nose normal.     Mouth/Throat:     Pharynx: Uvula midline.  Eyes:     General: No scleral icterus.    Extraocular Movements: Extraocular movements intact.     Conjunctiva/sclera: Conjunctivae normal.     Pupils: Pupils are equal, round, and reactive to light.  Neck:     Vascular: Carotid bruit (L sided) present.  Cardiovascular:     Rate and Rhythm: Normal rate and regular rhythm.     Pulses: Normal pulses.          Radial pulses are 2+ on the right side and 2+ on the left side.     Heart sounds: Murmur (2/6 systolic) present.  Pulmonary:     Effort: Pulmonary effort is normal. No respiratory distress.     Breath sounds: Normal breath sounds. No wheezing, rhonchi or rales.  Abdominal:     General: Abdomen is flat. Bowel sounds are normal. There is no distension.     Palpations: Abdomen is soft. There is no mass.     Tenderness: There is no abdominal tenderness. There is no guarding or rebound.     Hernia: No hernia is present.  Musculoskeletal:        General: Normal range of motion.     Cervical back: Normal range of motion and neck supple.     Right lower leg: No edema.     Left lower leg: No edema.    Lymphadenopathy:     Cervical: No cervical adenopathy.  Skin:    General: Skin is warm and dry.     Findings: Lesion (small flesh colored nodule R cheek) present. No rash.  Neurological:     General: No focal deficit present.     Mental Status: She is alert and oriented to person, place, and time.     Comments:  CN grossly intact, station and gait intact Recall 2/3, 3/3 with cue Calculation 5/5 DLROW  Psychiatric:        Mood and Affect: Mood normal.        Behavior: Behavior normal.        Thought Content: Thought content normal.        Judgment: Judgment normal.       Results for orders placed or performed in visit on 09/10/19  CBC with Differential  Result Value Ref Range   WBC 8.8 4.0 - 10.5 K/uL   RBC 5.08 3.87 - 5.11 Mil/uL   Hemoglobin 13.6 12.0 - 15.0 g/dL   HCT 41.1 36.0 - 46.0 %   MCV 80.9 78.0 - 100.0 fl   MCHC 33.1 30.0 - 36.0 g/dL   RDW 14.0 11.5 - 15.5 %   Platelets 236.0 150.0 - 400.0 K/uL   Neutrophils Relative % 62.5 43.0 - 77.0 %   Lymphocytes Relative 26.7 12.0 - 46.0 %   Monocytes Relative 7.7 3.0 - 12.0 %   Eosinophils Relative 1.8 0.0 - 5.0 %   Basophils Relative 1.3 0.0 - 3.0 %   Neutro Abs 5.5 1.4 - 7.7 K/uL   Lymphs Abs 2.4 0.7 - 4.0 K/uL   Monocytes Absolute 0.7 0.1 - 1.0 K/uL   Eosinophils Absolute 0.2 0.0 - 0.7 K/uL   Basophils Absolute 0.1 0.0 - 0.1 K/uL  vit d  Result Value Ref Range   VITD 32.72 30.00 - 100.00 ng/mL  T4,  Free  Result Value Ref Range   Free T4 0.98 0.60 - 1.60 ng/dL  TSH  Result Value Ref Range   TSH 0.22 (L) 0.35 - 4.50 uIU/mL  Hemoglobin A1c  Result Value Ref Range   Hgb A1c MFr Bld 9.7 (H) 4.6 - 6.5 %  Comprehensive metabolic panel  Result Value Ref Range   Sodium 139 135 - 145 mEq/L   Potassium 4.1 3.5 - 5.1 mEq/L   Chloride 105 96 - 112 mEq/L   CO2 26 19 - 32 mEq/L   Glucose, Bld 225 (H) 70 - 99 mg/dL   BUN 15 6 - 23 mg/dL   Creatinine, Ser 1.06 0.40 - 1.20 mg/dL   Total Bilirubin 0.4 0.2 - 1.2 mg/dL    Alkaline Phosphatase 90 39 - 117 U/L   AST 12 0 - 37 U/L   ALT 10 0 - 35 U/L   Total Protein 6.2 6.0 - 8.3 g/dL   Albumin 4.0 3.5 - 5.2 g/dL   GFR 50.24 (L) >60.00 mL/min   Calcium 9.7 8.4 - 10.5 mg/dL  Lipid panel  Result Value Ref Range   Cholesterol 212 (H) 0 - 200 mg/dL   Triglycerides 153.0 (H) 0.0 - 149.0 mg/dL   HDL 40.40 >39.00 mg/dL   VLDL 30.6 0.0 - 40.0 mg/dL   LDL Cholesterol 141 (H) 0 - 99 mg/dL   Total CHOL/HDL Ratio 5    NonHDL 171.68    Assessment & Plan:  This visit occurred during the SARS-CoV-2 public health emergency.  Safety protocols were in place, including screening questions prior to the visit, additional usage of staff PPE, and extensive cleaning of exam room while observing appropriate contact time as indicated for disinfecting solutions.   Problem List Items Addressed This Visit    Vitamin D deficiency    Vit D levels stable.       TIA (transient ischemic attack)    Continue crestor, aspirin.       Skin lesion    Nodule on R cheek, thought mole, may be enlarging - will refer to derm for eval.       Relevant Orders   Ambulatory referral to Dermatology   Sjogren's syndrome without extraglandular involvement [facility])    Seeing dentist Q4 mo regularly.  Pt has been told by dentist she needs abx ppx prior to surgery. States this is due to h/o R hip replacement and recent carotid stent. Discussed I don't think she qualifies for need for dental ppx abx at this time, but to touch base with Dr Carlis Abbott and dentist. She will also let me know name of abx she has used previously (in PCN allergy) and I will send in while she touches base with above providers regarding dental prophylaxis.       Renal insufficiency    Reviewed Cr trend over the past year - from normal range 06/2018 to GFR 50 on latest check. Will need to renew efforts at improved glycemic control. Will continue to closely monitor.       Pain and swelling of lower leg    This seems to have  resolved after coming off amlodipine.       PAD (peripheral artery disease) ([place])   Medicare annual wellness visit, subsequent - Primary    I have personally reviewed the Medicare Annual Wellness questionnaire and have noted 1. The patient's medical and social history 2. Their use of alcohol, tobacco or illicit drugs 3. Their current medications and supplements 4. The  patient's functional ability including ADL's, fall risks, home safety risks and hearing or visual impairment. Cognitive function has been assessed and addressed as indicated.  5. Diet and physical activity 6. Evidence for depression or mood disorders The patients weight, height, BMI have been recorded in the chart. I have made referrals, counseling and provided education to the patient based on review of the above and I have provided the pt with a written personalized care plan for preventive services. Provider list updated.. See scanned questionairre as needed for further documentation. Reviewed preventative protocols and updated unless pt declined.       LADA (latent autoimmune diabetes in adults), managed as type 1 ([place])    Appreciate endo care. She has just received new insulin pump, awaiting programming.       Intractable episodic headache    Overall improved since L carotid stent however still with intermittent headaches.       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 Will not change thyroid regimen at this time, await endo input, and likely reassess at f/u labs.       Relevant Medications   SYNTHROID 75 MCG tablet   Hyperlipidemia associated with type 2 diabetes mellitus (HCC)    Chronic, unfortunately with deteriorated control noted despite daily crestor 10mg . She already follows low chol diet. Recheck next labs, consider increased crestor dose.  The 10-year ASCVD risk score [name]., et al., 2013) is:  44.7%   Values used to calculate the score:     Age: 55 years     Sex: Female     Is Non-Hispanic African American: No     Diabetic: Yes     Tobacco smoker: No     Systolic Blood Pressure: Q000111Q mmHg     Is BP treated: Yes     HDL Cholesterol: 40.4 mg/dL     Total Cholesterol: 212 mg/dL       Health maintenance examination    Preventative protocols reviewed and updated unless pt declined. Discussed healthy diet and lifestyle.       Fatty liver    LFTs normal.       Essential hypertension    Chronic, stable. Continue torpol XL and lisinopril daily.       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 S/p carotid stent placed LICA [id number]. Continue aspirin, plavix, statin.       Advanced care planning/counseling discussion    Advanced directive: forms were scanned (08/2017) - HCPOA are son Cassandra Benson and Cassandra Benson. Does not want prolonged life support if terminal condition. HCPOA has authority to override directives.           Meds ordered this encounter  Medications  . SYNTHROID 75 MCG tablet    Sig: TAKE 1 TABLET BY MOUTH ONCE DAILY FOR 6 DAYS AND 1 AND 1/2 TABLETS ON THE 7TH DAY    Dispense:  105 tablet    Refill:  3   Orders Placed This Encounter  Procedures  . Ambulatory referral to Dermatology    Referral Priority:   Routine    Referral Type:   Consultation    Referral Reason:   Specialty Services Required    Requested Specialty:   Dermatology    Number of Visits Requested:   1    Patient instructions: We will request records from [place] colonoscopy  2014. Check at home for records.   Send me name of antibiotic we have previously used for dental prevention.  Good to see you today! Return as needed or in 4 months for follow up visit.   Follow up plan: Return in about 4 months (around 01/12/2020) for follow up visit.  [name], MD

## 2019-09-13 NOTE — Assessment & Plan Note (Signed)
Preventative protocols reviewed and updated unless pt declined. Discussed healthy diet and lifestyle.  

## 2019-09-13 NOTE — Telephone Encounter (Signed)
Attempted to contact pt.  No answer.  No vm.  Need to know what [facility] office she had colonoscopy so we can report.

## 2019-09-13 NOTE — Assessment & Plan Note (Signed)

## 2019-09-13 NOTE — Patient Instructions (Addendum)
We will request records from [place] colonoscopy 2014. Check at home for records.   Send me name of antibiotic we have previously used for dental prevention.  Good to see you today! Return as needed or in 4 months for follow up visit.   Health Maintenance After Age 77 After age 24, you are at a higher risk for certain long-term diseases and infections as well as injuries from falls. Falls are a major cause of broken bones and head injuries in people who are older than age 60. Getting regular preventive care can help to keep you healthy and well. Preventive care includes getting regular testing and making lifestyle changes as recommended by your health care provider. Talk with your health care provider about:  Which screenings and tests you should have. A screening is a test that checks for a disease when you have no symptoms.  A diet and exercise plan that is right for you. What should I know about screenings and tests to prevent falls? Screening and testing are the best ways to find a health problem early. Early diagnosis and treatment give you the best chance of managing medical conditions that are common after age 64. Certain conditions and lifestyle choices may make you more likely to have a fall. Your health care provider may recommend:  Regular vision checks. Poor vision and conditions such as cataracts can make you more likely to have a fall. If you wear glasses, make sure to get your prescription updated if your vision changes.  Medicine review. Work with your health care provider to regularly review all of the medicines you are taking, including over-the-counter medicines. Ask your health care provider about any side effects that may make you more likely to have a fall. Tell your health care provider if any medicines that you take make you feel dizzy or sleepy.  Osteoporosis screening. Osteoporosis is a condition that causes the bones to get weaker. This can make the bones weak and cause  them to break more easily.  Blood pressure screening. Blood pressure changes and medicines to control blood pressure can make you feel dizzy.  Strength and balance checks. Your health care provider may recommend certain tests to check your strength and balance while standing, walking, or changing positions.  Foot health exam. Foot pain and numbness, as well as not wearing proper footwear, can make you more likely to have a fall.  Depression screening. You may be more likely to have a fall if you have a fear of falling, feel emotionally low, or feel unable to do activities that you used to do.  Alcohol use screening. Using too much alcohol can affect your balance and may make you more likely to have a fall. What actions can I take to lower my risk of falls? General instructions  Talk with your health care provider about your risks for falling. Tell your health care provider if: ? You fall. Be sure to tell your health care provider about all falls, even ones that seem minor. ? You feel dizzy, sleepy, or off-balance.  Take over-the-counter and prescription medicines only as told by your health care provider. These include any supplements.  Eat a healthy diet and maintain a healthy weight. A healthy diet includes low-fat dairy products, low-fat (lean) meats, and fiber from whole grains, beans, and lots of fruits and vegetables. Home safety  Remove any tripping hazards, such as rugs, cords, and clutter.  Install safety equipment such as grab bars in bathrooms and safety  rails on stairs.  Keep rooms and walkways well-lit. Activity   Follow a regular exercise program to stay fit. This will help you maintain your balance. Ask your health care provider what types of exercise are appropriate for you.  If you need a cane or walker, use it as recommended by your health care provider.  Wear supportive shoes that have nonskid soles. Lifestyle  Do not drink alcohol if your health care provider  tells you not to drink.  If you drink alcohol, limit how much you have: ? 0-1 drink a day for women. ? 0-2 drinks a day for men.  Be aware of how much alcohol is in your drink. In the [place] one drink equals one typical bottle of beer (12 oz), one-half glass of wine (5 oz), or one shot of hard liquor (1 oz).  Do not use any products that contain nicotine or tobacco, such as cigarettes and e-cigarettes. If you need help quitting, ask your health care provider. Summary  Having a healthy lifestyle and getting preventive care can help to protect your health and wellness after age 73.  Screening and testing are the best way to find a health problem early and help you avoid having a fall. Early diagnosis and treatment give you the best chance for managing medical conditions that are more common for people who are older than age 38.  Falls are a major cause of broken bones and head injuries in people who are older than age 39. Take precautions to prevent a fall at home.  Work with your health care provider to learn what changes you can make to improve your health and wellness and to prevent falls. This information is not intended to replace advice given to you by your health care provider. Make sure you discuss any questions you have with your health care provider. Document Released: 07/26/2017 Document Revised: 01/03/2019 Document Reviewed: 07/26/2017 Elsevier Patient Education  2020 [employer].

## 2019-09-14 ENCOUNTER — Encounter: Payer: Self-pay | Admitting: Family Medicine

## 2019-09-14 DIAGNOSIS — L989 Disorder of the skin and subcutaneous tissue, unspecified: Secondary | ICD-10-CM | POA: Insufficient documentation

## 2019-09-14 DIAGNOSIS — N289 Disorder of kidney and ureter, unspecified: Secondary | ICD-10-CM | POA: Insufficient documentation

## 2019-09-14 NOTE — Assessment & Plan Note (Signed)
Appreciate endo care. She has just received new insulin pump, awaiting programming.

## 2019-09-14 NOTE — Assessment & Plan Note (Addendum)
Appreciate VVS care. S/p carotid stent placed LICA [id number]. Continue aspirin, plavix, statin.

## 2019-09-14 NOTE — Assessment & Plan Note (Signed)
Chronic, stable. Continue torpol XL and lisinopril daily.

## 2019-09-14 NOTE — Assessment & Plan Note (Signed)
TSH again low today, however fT4 normal range and she denies any hyperthyroid symptoms. Will not change thyroid regimen at this time, await endo input, and likely reassess at f/u labs.

## 2019-09-14 NOTE — Assessment & Plan Note (Signed)
LFTs normal

## 2019-09-14 NOTE — Assessment & Plan Note (Signed)
Chronic, unfortunately with deteriorated control noted despite daily crestor 10mg . She already follows low chol diet. Recheck next labs, consider increased crestor dose.  The 10-year ASCVD risk score [name]., et al., 2013) is: 44.7%   Values used to calculate the score:     Age: 78 years     Sex: Female     Is Non-Hispanic African American: No     Diabetic: Yes     Tobacco smoker: No     Systolic Blood Pressure: Q000111Q mmHg     Is BP treated: Yes     HDL Cholesterol: 40.4 mg/dL     Total Cholesterol: 212 mg/dL

## 2019-09-14 NOTE — Assessment & Plan Note (Signed)
Advanced directive: forms were scanned (08/2017) - HCPOA are son Lyndsi Ally and [name]. Does not want prolonged life support if terminal condition. HCPOA has authority to override directives.

## 2019-09-14 NOTE — Assessment & Plan Note (Signed)
Continue crestor, aspirin.

## 2019-09-14 NOTE — Assessment & Plan Note (Addendum)
Nodule on R cheek, thought mole, may be enlarging - will refer to derm for eval.

## 2019-09-14 NOTE — Assessment & Plan Note (Signed)
Reviewed Cr trend over the past year - from normal range 06/2018 to GFR 50 on latest check. Will need to renew efforts at improved glycemic control. Will continue to closely monitor.

## 2019-09-14 NOTE — Assessment & Plan Note (Signed)
Vit D levels stable  

## 2019-09-14 NOTE — Assessment & Plan Note (Addendum)
Seeing dentist Q4 mo regularly.  Pt has been told by dentist she needs abx ppx prior to surgery. States this is due to h/o R hip replacement and recent carotid stent. Discussed I don't think she qualifies for need for dental ppx abx at this time, but to touch base with Dr Carlis Abbott and dentist. She will also let me know name of abx she has used previously (in PCN allergy) and I will send in while she touches base with above providers regarding dental prophylaxis.

## 2019-09-14 NOTE — Assessment & Plan Note (Deleted)
Advanced directive: forms were scanned (08/2017) - HCPOA are son Rakita Jesser and [name]. Does not want prolonged life support if terminal condition. HCPOA has authority to override directives.

## 2019-09-14 NOTE — Assessment & Plan Note (Signed)
Overall improved since L carotid stent however still with intermittent headaches.

## 2019-09-14 NOTE — Assessment & Plan Note (Signed)
This seems to have resolved after coming off amlodipine.

## 2019-09-16 NOTE — Telephone Encounter (Signed)
Patient returned your call She stated she does not remember the providers name but she had this done at the [facility] [place]?   Fax# for medical record request [phone]

## 2019-09-16 NOTE — Telephone Encounter (Signed)
appointment scheduled for next Monday

## 2019-09-16 NOTE — Telephone Encounter (Signed)
Note.  Faxed MR request.

## 2019-09-17 ENCOUNTER — Encounter: Payer: Self-pay | Admitting: Family Medicine

## 2019-09-17 ENCOUNTER — Telehealth: Payer: Self-pay | Admitting: Nutrition

## 2019-09-17 NOTE — Telephone Encounter (Signed)
Spoke with pharmacy about clarification of fax received about Dexcom G6 sensors.  She was told that this patient needed G6 sensors: 1q10 days X90 days and 1 transmitter q 90 days.  Refills X2

## 2019-09-17 NOTE — Telephone Encounter (Addendum)
Colonoscopy received but no polyp pathology report. Can we request pathology report on date 11/16/2011?  thanks

## 2019-09-18 ENCOUNTER — Other Ambulatory Visit: Payer: Self-pay

## 2019-09-18 MED ORDER — DEXCOM G6 TRANSMITTER MISC: 1.0000 | 1 refills | Status: DC

## 2019-09-18 MED ORDER — DEXCOM G6 SENSOR MISC: 1.0000 | 3 refills | Status: DC

## 2019-09-18 MED ORDER — DEXCOM G6 RECEIVER DEVI: 1.0000 | 0 refills | Status: DC

## 2019-09-18 NOTE — Telephone Encounter (Signed)
Faxed request

## 2019-09-19 ENCOUNTER — Encounter: Payer: Self-pay | Admitting: Family Medicine

## 2019-09-23 ENCOUNTER — Encounter: Payer: PPO | Attending: Internal Medicine | Admitting: Nutrition

## 2019-09-23 ENCOUNTER — Other Ambulatory Visit: Payer: Self-pay

## 2019-09-23 DIAGNOSIS — E139 Other specified diabetes mellitus without complications: Secondary | ICD-10-CM

## 2019-09-23 NOTE — Patient Instructions (Signed)
Call when pump arrives for appointment for training

## 2019-09-23 NOTE — Progress Notes (Signed)
Patient came in with a medtronic insulin pump.  She reported that she wanted a Tandem pump, so that her medicare with pay for the sensors and the need to not have to calibrate the sensor readings 3-4 times/day with a fingerstick.  She says she thought Dr. Cruzita Lederer wanted her to have a medtronic pump.  I Rosealee Albee, and they said that, after getting a general pump prescription, [employer] called them and told them to send her out a Medtronic pump.  She says that she "in no way wanted a Medtronic Pump.  She said they called her to start her on her Medtonic pump, but she declined, saying that she was being trained by myself.   Per Gita Kudo at [place], they will issue a return request with [employer], and ship out the tandem pump.  They did not need any paperwork from [place].  She reports also working on [employer] order for her.   Patient will call me when Tandem pump comes in.

## 2019-09-24 ENCOUNTER — Other Ambulatory Visit: Payer: Self-pay | Admitting: *Deleted

## 2019-09-24 DIAGNOSIS — I6522 Occlusion and stenosis of left carotid artery: Secondary | ICD-10-CM

## 2019-09-30 DIAGNOSIS — E109 Type 1 diabetes mellitus without complications: Secondary | ICD-10-CM | POA: Diagnosis not present

## 2019-10-02 ENCOUNTER — Telehealth: Payer: Self-pay

## 2019-10-02 NOTE — Telephone Encounter (Signed)
Wanted to let Vaughan Basta know that supplies received today were the the ones missing for 2 months and her new machine will be here tomorrow

## 2019-10-03 DIAGNOSIS — E109 Type 1 diabetes mellitus without complications: Secondary | ICD-10-CM | POA: Diagnosis not present

## 2019-10-03 DIAGNOSIS — E139 Other specified diabetes mellitus without complications: Secondary | ICD-10-CM | POA: Diagnosis not present

## 2019-10-04 ENCOUNTER — Other Ambulatory Visit: Payer: Self-pay | Admitting: Family Medicine

## 2019-10-07 ENCOUNTER — Ambulatory Visit (INDEPENDENT_AMBULATORY_CARE_PROVIDER_SITE_OTHER): Payer: PPO | Admitting: Family Medicine

## 2019-10-07 ENCOUNTER — Encounter: Payer: Self-pay | Admitting: Family Medicine

## 2019-10-07 ENCOUNTER — Other Ambulatory Visit: Payer: Self-pay

## 2019-10-07 VITALS — BP 134/80 | HR 104 | Temp 97.5°F | Ht 63.5 in | Wt 157.4 lb

## 2019-10-07 DIAGNOSIS — M797 Fibromyalgia: Secondary | ICD-10-CM

## 2019-10-07 DIAGNOSIS — E063 Autoimmune thyroiditis: Secondary | ICD-10-CM

## 2019-10-07 DIAGNOSIS — R109 Unspecified abdominal pain: Secondary | ICD-10-CM | POA: Insufficient documentation

## 2019-10-07 DIAGNOSIS — E038 Other specified hypothyroidism: Secondary | ICD-10-CM

## 2019-10-07 LAB — CBC WITH DIFFERENTIAL/PLATELET
Basophils Absolute: 0.1 10*3/uL (ref 0.0–0.1)
Basophils Relative: 1 % (ref 0.0–3.0)
Eosinophils Absolute: 0.1 10*3/uL (ref 0.0–0.7)
Eosinophils Relative: 2.3 % (ref 0.0–5.0)
HCT: 41.1 % (ref 36.0–46.0)
Hemoglobin: 13.6 g/dL (ref 12.0–15.0)
Lymphocytes Relative: 30.7 % (ref 12.0–46.0)
Lymphs Abs: 2 10*3/uL (ref 0.7–4.0)
MCHC: 33.1 g/dL (ref 30.0–36.0)
MCV: 80.9 fl (ref 78.0–100.0)
Monocytes Absolute: 0.5 10*3/uL (ref 0.1–1.0)
Monocytes Relative: 8.4 % (ref 3.0–12.0)
Neutro Abs: 3.7 10*3/uL (ref 1.4–7.7)
Neutrophils Relative %: 57.6 % (ref 43.0–77.0)
Platelets: 221 10*3/uL (ref 150.0–400.0)
RBC: 5.08 Mil/uL (ref 3.87–5.11)
RDW: 13.9 % (ref 11.5–15.5)
WBC: 6.4 10*3/uL (ref 4.0–10.5)

## 2019-10-07 LAB — POC URINALSYSI DIPSTICK (AUTOMATED)
Bilirubin, UA: NEGATIVE
Glucose, UA: NEGATIVE
Ketones, UA: NEGATIVE
Leukocytes, UA: NEGATIVE
Nitrite, UA: NEGATIVE
Protein, UA: NEGATIVE
Spec Grav, UA: 1.015 (ref 1.010–1.025)
Urobilinogen, UA: 0.2 E.U./dL
pH, UA: 6 (ref 5.0–8.0)

## 2019-10-07 LAB — COMPREHENSIVE METABOLIC PANEL
ALT: 11 U/L (ref 0–35)
AST: 13 U/L (ref 0–37)
Albumin: 4.1 g/dL (ref 3.5–5.2)
Alkaline Phosphatase: 70 U/L (ref 39–117)
BUN: 15 mg/dL (ref 6–23)
CO2: 26 mEq/L (ref 19–32)
Calcium: 9.7 mg/dL (ref 8.4–10.5)
Chloride: 104 mEq/L (ref 96–112)
Creatinine, Ser: 0.96 mg/dL (ref 0.40–1.20)
GFR: 56.32 mL/min — ABNORMAL LOW (ref >60.00)
Glucose, Bld: 286 mg/dL — ABNORMAL HIGH (ref 70–99)
Potassium: 4.4 mEq/L (ref 3.5–5.1)
Sodium: 137 mEq/L (ref 135–145)
Total Bilirubin: 0.4 mg/dL (ref 0.2–1.2)
Total Protein: 6.9 g/dL (ref 6.0–8.3)

## 2019-10-07 LAB — T4, FREE: Free T4: 1.37 ng/dL (ref 0.60–1.60)

## 2019-10-07 LAB — SEDIMENTATION RATE: Sed Rate: 26 mm/hr (ref 0–30)

## 2019-10-07 LAB — LIPASE: Lipase: 18 U/L (ref 11.0–59.0)

## 2019-10-07 LAB — TSH: TSH: 0.12 u[IU]/mL — ABNORMAL LOW (ref 0.35–4.50)

## 2019-10-07 MED ORDER — TRAMADOL HCL 50 MG PO TABS: 25.0000 mg | ORAL_TABLET | Freq: Two times a day (BID) | ORAL | 0 refills | Status: AC | PRN

## 2019-10-07 NOTE — Patient Instructions (Addendum)
Labs today.  Urine check today.  Ok to continue tylenol 650mg  three times a day with meals.  Optimize sugar control for kidney health.  Ok to try tramadol 1/2-1 tablet as needed for pain.  Good to see you today!

## 2019-10-07 NOTE — Progress Notes (Signed)
This visit was conducted in person.  BP 134/80 (BP Location: Left Arm, Patient Position: Sitting, Cuff Size: Normal)   Pulse (!) 104   Temp (!) 97.5 F (36.4 C) (Temporal)   Ht 5' 3.5" (1.613 m)   Wt 157 lb 7 oz (71.4 kg)   SpO2 99%   BMI 27.45 kg/m    CC: R flank pain Subjective:    Patient ID: Cassandra Benson, female    DOB: [birth year], 79 y.o.   MRN: [id number]  HPI: Cassandra Benson is a 79 y.o. female presenting on 10/07/2019 for Flank Pain (C/o continuous right flank pain. Started about 2 wks ago. Tried Tylenol 650 mg, helfpul. ) and Abrasion (C/o abrasion type wounds to appear randomly.  Noticed yesterday. )   2 wk h/o constant R side flank pain from flank down leg, treating with tylenol 650 mg with benefit. No burning pain, no skin changes. Increased frequency (drinks plenty of water), some urgency. Some intermittent lower abdominal discomfort. No dysuria, nocturia, hematuria, nausea/vomiting, fevers/chills. No h/o kidney stones. No falls/injury.   Recently noted deteriorated kidney control (latest GFR 50).  DM - latest A1c 9's.      Relevant past medical, surgical, family and social history reviewed and updated as indicated. Interim medical history since our last visit reviewed. Allergies and medications reviewed and updated. Outpatient Medications Prior to Visit  Medication Sig Dispense Refill  . aspirin EC 81 MG tablet Take 81 mg by mouth daily.    . BD ULTRA-FINE LANCETS lancets Use as instructed upto 6 times daily 600 each 2  . benzocaine (ORAJEL) 10 % mucosal gel Use as directed 1 application in the mouth or throat 4 (four) times daily as needed for mouth pain.    Marland Kitchen clopidogrel (PLAVIX) 75 MG tablet Take 1 tablet (75 mg total) by mouth daily. 30 tablet 6  . Continuous Blood Gluc Receiver (DEXCOM G6 RECEIVER) DEVI 1 kit by Does not apply route See admin instructions. Use to check blood sugar 4 times a day 1 each 0  . Continuous Blood Gluc Sensor (DEXCOM G6 SENSOR)  MISC 1 each by Does not apply route See admin instructions. Change sensor every 10 days 3 each 3  . Continuous Blood Gluc Transmit (DEXCOM G6 TRANSMITTER) MISC 1 kit by Does not apply route See admin instructions. Use to check blood sugar 1 each 1  . diclofenac sodium (VOLTAREN) 1 % GEL Apply 1 application topically 3 (three) times daily. 1 Tube 1  . glucagon (GLUCAGON EMERGENCY) 1 MG injection Inject 1 mg into the muscle once as needed for up to 1 dose. 1 each 12  . glucose blood (CONTOUR NEXT TEST) test strip Use as instructed to check 4 times daily 400 each 5  . insulin aspart (NOVOLOG) 100 UNIT/ML FlexPen Inject 6-10 Units into the skin 3 (three) times daily with meals. 15 mL 11  . Insulin Degludec (TRESIBA FLEXTOUCH) 200 UNIT/ML SOPN Inject 30 Units into the skin daily.    . Insulin Pen Needle 32G X 4 MM MISC Use 4-5x a day 300 each 3  . Krill Oil 500 MG CAPS Take 2 capsules (1,000 mg total) by mouth daily. (Patient taking differently: Take 500 mg by mouth daily. )    . lisinopril (ZESTRIL) 10 MG tablet Take 1 tablet (10 mg total) by mouth daily. 90 tablet 0  . metoprolol succinate (TOPROL-XL) 25 MG 24 hr tablet Take 1 tablet (25 mg total) by mouth daily. 30 tablet  11  . neomycin-polymyxin-dexamethasone (MAXITROL) 0.1 % ophthalmic suspension Place 1 drop into both eyes daily.    . nitroGLYCERIN (NITROSTAT) 0.4 MG SL tablet Place 1 tablet (0.4 mg total) under the tongue every 5 (five) minutes as needed for chest pain. 30 tablet 1  . pantoprazole (PROTONIX) 40 MG tablet Take 1 tablet (40 mg total) by mouth daily as needed (GERD).    Marland Kitchen rosuvastatin (CRESTOR) 10 MG tablet Take 1 tablet (10 mg total) by mouth daily. 30 tablet 5  . SYNTHROID 75 MCG tablet TAKE 1 TABLET BY MOUTH ONCE DAILY FOR 6 DAYS AND 1 AND 1/2 TABLETS ON THE 7TH DAY 105 tablet 3  . pantoprazole (PROTONIX) 40 MG tablet Take 1 tablet (40 mg total) by mouth daily. 90 tablet 1   No facility-administered medications prior to visit.      Per HPI unless specifically indicated in ROS section below Review of Systems Objective:    BP 134/80 (BP Location: Left Arm, Patient Position: Sitting, Cuff Size: Normal)   Pulse (!) 104   Temp (!) 97.5 F (36.4 C) (Temporal)   Ht 5' 3.5" (1.613 m)   Wt 157 lb 7 oz (71.4 kg)   SpO2 99%   BMI 27.45 kg/m   Wt Readings from Last 3 Encounters:  10/07/19 157 lb 7 oz (71.4 kg)  09/13/19 155 lb 8 oz (70.5 kg)  09/03/19 154 lb (69.9 kg)    Physical Exam Vitals and nursing note reviewed.  Constitutional:      Appearance: Normal appearance. She is not ill-appearing.  HENT:     Head: Normocephalic and atraumatic.  Eyes:     Extraocular Movements: Extraocular movements intact.     Conjunctiva/sclera: Conjunctivae normal.     Pupils: Pupils are equal, round, and reactive to light.  Cardiovascular:     Rate and Rhythm: Normal rate and regular rhythm.     Pulses: Normal pulses.     Heart sounds: Normal heart sounds. No murmur.  Pulmonary:     Effort: Pulmonary effort is normal. No respiratory distress.     Breath sounds: Normal breath sounds. No wheezing, rhonchi or rales.  Abdominal:     General: Bowel sounds are normal. There is no distension.     Palpations: Abdomen is soft. There is no mass.     Tenderness: There is generalized abdominal tenderness (moderate, R>L). There is right CVA tenderness. There is no left CVA tenderness, guarding or rebound. Negative signs include Murphy's sign.     Hernia: No hernia is present.  Musculoskeletal:        General: Tenderness present. Normal range of motion.     Right lower leg: No edema.     Left lower leg: No edema.     Comments:  + pain along midline spine diffusely + paraspinous mm tenderness R>L Tender with SLR test on right (tightness in groin, no radiating pain down leg) Discomfort with int/ext rotation at hip. Discomfort to palpation along R>L SIJ, sciatic notch, GTB  Neurological:     General: No focal deficit present.      Mental Status: She is alert.  Psychiatric:        Mood and Affect: Mood normal.        Behavior: Behavior normal.       Results for orders placed or performed in visit on 09/10/19  CBC with Differential  Result Value Ref Range   WBC 8.8 4.0 - 10.5 K/uL   RBC 5.08 3.87 -  5.11 Mil/uL   Hemoglobin 13.6 12.0 - 15.0 g/dL   HCT 41.1 36.0 - 46.0 %   MCV 80.9 78.0 - 100.0 fl   MCHC 33.1 30.0 - 36.0 g/dL   RDW 14.0 11.5 - 15.5 %   Platelets 236.0 150.0 - 400.0 K/uL   Neutrophils Relative % 62.5 43.0 - 77.0 %   Lymphocytes Relative 26.7 12.0 - 46.0 %   Monocytes Relative 7.7 3.0 - 12.0 %   Eosinophils Relative 1.8 0.0 - 5.0 %   Basophils Relative 1.3 0.0 - 3.0 %   Neutro Abs 5.5 1.4 - 7.7 K/uL   Lymphs Abs 2.4 0.7 - 4.0 K/uL   Monocytes Absolute 0.7 0.1 - 1.0 K/uL   Eosinophils Absolute 0.2 0.0 - 0.7 K/uL   Basophils Absolute 0.1 0.0 - 0.1 K/uL  vit d  Result Value Ref Range   VITD 32.72 30.00 - 100.00 ng/mL  T4, Free  Result Value Ref Range   Free T4 0.98 0.60 - 1.60 ng/dL  TSH  Result Value Ref Range   TSH 0.22 (L) 0.35 - 4.50 uIU/mL  Hemoglobin A1c  Result Value Ref Range   Hgb A1c MFr Bld 9.7 (H) 4.6 - 6.5 %  Comprehensive metabolic panel  Result Value Ref Range   Sodium 139 135 - 145 mEq/L   Potassium 4.1 3.5 - 5.1 mEq/L   Chloride 105 96 - 112 mEq/L   CO2 26 19 - 32 mEq/L   Glucose, Bld 225 (H) 70 - 99 mg/dL   BUN 15 6 - 23 mg/dL   Creatinine, Ser 1.06 0.40 - 1.20 mg/dL   Total Bilirubin 0.4 0.2 - 1.2 mg/dL   Alkaline Phosphatase 90 39 - 117 U/L   AST 12 0 - 37 U/L   ALT 10 0 - 35 U/L   Total Protein 6.2 6.0 - 8.3 g/dL   Albumin 4.0 3.5 - 5.2 g/dL   GFR 50.24 (L) >60.00 mL/min   Calcium 9.7 8.4 - 10.5 mg/dL  Lipid panel  Result Value Ref Range   Cholesterol 212 (H) 0 - 200 mg/dL   Triglycerides 153.0 (H) 0.0 - 149.0 mg/dL   HDL 40.40 >39.00 mg/dL   VLDL 30.6 0.0 - 40.0 mg/dL   LDL Cholesterol 141 (H) 0 - 99 mg/dL   Total CHOL/HDL Ratio 5    NonHDL 171.68      Assessment & Plan:  This visit occurred during the SARS-CoV-2 public health emergency.  Safety protocols were in place, including screening questions prior to the visit, additional usage of staff PPE, and extensive cleaning of exam room while observing appropriate contact time as indicated for disinfecting solutions.   Problem List Items Addressed This Visit    Right sided abdominal pain - Primary    Diffuse generalized pain from R flank down side of abdomen into R hip and thigh. Will evaluate with urinalysis and labs today. ?fibromyalgia flare. Discussed tylenol/tramadol use. Ok to try tramadol sparingly. Will call with labwork results. 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      Relevant Orders   TSH   T4, Free   Fibromyalgia    ?flare - trial tramadol PRN in addition to tylenol.  Relevant Medications   traMADol (ULTRAM) 50 MG tablet       Meds ordered this encounter  Medications  . traMADol (ULTRAM) 50 MG tablet    Sig: Take 0.5-1 tablets (25-50 mg total) by mouth 2 (two) times daily as needed for up to 5 days for moderate pain.    Dispense:  15 tablet    Refill:  0   Orders Placed This Encounter  Procedures  . Comprehensive metabolic panel  . CBC with Differential  . Lipase  . Sedimentation rate  . TSH  . T4, Free    Patient Instructions  Labs today.  Urine check today.  Ok to continue tylenol 625m three times a day with meals.  Optimize sugar control for kidney health.  Ok to try tramadol 1/2-1 tablet as needed for pain.  Good to see you today!    Follow up plan: No follow-ups on file.  JRia Bush MD

## 2019-10-07 NOTE — Assessment & Plan Note (Signed)
Diffuse generalized pain from R flank down side of abdomen into R hip and thigh. Will evaluate with urinalysis and labs today. ?fibromyalgia flare. Discussed tylenol/tramadol use. Ok to try tramadol sparingly. Will call with labwork results. Update with effect

## 2019-10-07 NOTE — Assessment & Plan Note (Signed)
?  flare - trial tramadol PRN in addition to tylenol.

## 2019-10-07 NOTE — Assessment & Plan Note (Signed)
Last TSH was low - has endo appt tomorrow - will update TFTs.

## 2019-10-07 NOTE — Addendum Note (Signed)
Addended by: Brenton Grills on: [id number] AM   Modules accepted: Orders

## 2019-10-08 ENCOUNTER — Encounter: Payer: Self-pay | Admitting: Internal Medicine

## 2019-10-08 ENCOUNTER — Ambulatory Visit: Payer: PPO | Admitting: Internal Medicine

## 2019-10-08 VITALS — BP 120/70 | HR 95 | Ht 63.5 in | Wt 157.0 lb

## 2019-10-08 DIAGNOSIS — E782 Mixed hyperlipidemia: Secondary | ICD-10-CM

## 2019-10-08 DIAGNOSIS — E139 Other specified diabetes mellitus without complications: Secondary | ICD-10-CM

## 2019-10-08 DIAGNOSIS — E063 Autoimmune thyroiditis: Secondary | ICD-10-CM

## 2019-10-08 DIAGNOSIS — E038 Other specified hypothyroidism: Secondary | ICD-10-CM | POA: Diagnosis not present

## 2019-10-08 LAB — POCT GLYCOSYLATED HEMOGLOBIN (HGB A1C): Hemoglobin A1C: 9.8 % — AB (ref 4.0–5.6)

## 2019-10-08 NOTE — Progress Notes (Signed)
Patient ID: Cassandra Benson, female   DOB: 23-May-1941, 79 y.o.   MRN: [id number]   This visit occurred during the SARS-CoV-2 public health emergency.  Safety protocols were in place, including screening questions prior to the visit, additional usage of staff PPE, and extensive cleaning of exam room while observing appropriate contact time as indicated for disinfecting solutions.   HPI: Cassandra Benson is a 80 y.o.-year-old female, returning for f/u for LADA, initially dx'ed in 1998 (78 y/o), started insulin at dx, started insulin pump in ~2008, uncontrolled, without complications and hypothyroidism. She previously saw endocrinology at [facility] (Dr. Janese Banks) and Dr Howell Rucks. Last visit with me 3 months ago.  She has a significant amount of pain - all over her body -due to her rheumatologic autoimmune diseases.  Since last visit, she received the Medtronic insulin pump, but she would like to switch to a tandem T slim, which she will connect insulin pump so she sent the Medtronic pump back and she now has the t:slim X2 pump which she will connect in the next few days.  Reviewed HbA1c levels: Lab Results  Component Value Date   HGBA1C 9.7 (H) 09/10/2019   HGBA1C 9.8 (A) 06/27/2019   HGBA1C 8.4 (A) 03/11/2019   She was previously on an insulin pump: - Medtronic 723-started 09/2016 (changed 07/2017), without CGM.  She uses NovoLog in the pump.  She was using Medtronic for supplies before, but she is now forced by insurance to use [employer].  She came off the pump as she had a lot of problems getting supplies from them. - basal rates: 12 am: 0.800 units/h 3 am: 0.875 9:30 am: 0.100 >> 0.500 5 pm: 1.500 6:30 pm: 1.300 11:30 pm: 1.800  - ICR:   12 am: 10 (8 if on prednisone) 6 pm: 8 (6 if on Prednisone) - target: 130-130 except 6-8 am and 6 pm-12 am: 100-100 - ISF: 35 - Insulin on Board: 4h - bolus wizard: On Total daily dose: 30 units per day (up to 40 units for prescription) - extended bolusing: Not  using - changes infusion site: 5 to 6 days - Meter: Bayer Contour   Now on: - Tresiba 18 >> 20-22 units in am - Novolog: ICR: 1:8 (starches)-1:10 (other foods), but mostly using carb equivalents Target: 130 Insulin sensitivity factor: 20 If you correct sugars at bedtime, only correct >200 and only give 2 to 3 units. She was previously using Glucophage ER d.a.w. but had to stop because this was not covered by her insurance.  She tried the generic metformin ER and this caused diarrhea so she had to stop.  Pt checks her sugars 3 times a day: - am:  113-168, 181 >> 131-203 >> 158-269 >> 175-300 - 2h after b'fast:  107-148 >> n/c >> n/c - before lunch: 127-286 >> 79, 120-200, 229 >> 177-234 >> 200's, 398 - 2h after lunch: 87-129 >> 120, 141 >> n/c >> 330 >> n/c - before dinner: 136-209 >> 92-219 >> 119, 132 >> 160-200s - 2h after dinner: 118-207, 247 >> 92-236 >> n/c - bedtime: n/c >> 171 >> n/c - nighttime:   92-141, 183 >> n/c >> 69 x 1 >> n/c Lowest sugar was 43 >> ... 53 (at night) >> 134; hypoglycemia awareness in the 80s.  No history of hypoglycemia admissions.  She does have a glucagon kit at home. Highest sugar was 400 on Prednisone >> 300s >> 330 >> 398; no history of DKA admissions.  Pt's meals  are: - Breakfast: protein drink + almond milk - Lunch: PB jelly sandwich or sandwich with ham or cream cheese sandwich - Dinner: salads  - Snacks: pretzels; pork tenderloin + veggies; chicken + tenderloin No sodas  She is walking 3x a week, for 30 min.  No CKD, last BUN/creatinine:  03/07/2019: 18/0.9, glu 250 Lab Results  Component Value Date   BUN 15 10/07/2019   BUN 15 09/10/2019   CREATININE 0.96 10/07/2019   CREATININE 1.06 09/10/2019  On lisinopril. + HL;  last set of lipids: Lab Results  Component Value Date   CHOL 212 (H) 09/10/2019   HDL 40.40 09/10/2019   LDLCALC 141 (H) 09/10/2019   LDLDIRECT 177.0 08/09/2016   TRIG 153.0 (H) 09/10/2019   CHOLHDL 5 09/10/2019   On Crestor 5, but she continues to have muscle cramps despite using co-Q10.  Praluent was not covered. - last eye exam was in 10/2018: no DR -She denies numbness and tingling in her feet.  She was admitted for CP + SOB 10/01/2017.  Cardiac events and PE were ruled out.  She is seeing cardiology (Dr. Rockey Situ).  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  Since last visit, she had right carotid ultrasound and there is a 39% stenosis, significantly increased since 2018.  She has a history of left carotid endarterectomy.  Hypothyroidism: -Due to Hashimoto's thyroiditis -She has a family history of Graves' disease in her mother -She was initially on Levoxyl 75 mcg daily 1 tab 6/7 days and 1.5 tabs 1/7 days, but she thought Levoxyl increased her lipid levels and caused hair loss, so we changed to Synthroid.  She feels much better on the brand name.  She is currently on Synthroid d.a.w. 75 mcg 6 out of 7 days and 75 + 37.5 mcg 1 of the 7 days (average 80 mcg daily).  She takes these: - in am - fasting - at least 30 min from b'fast - no Ca, Fe, PPIs - + Multivitamins at night - not on Biotin  Reviewed her TFTs: Lab Results  Component Value Date   TSH 0.12 (L) 10/07/2019   TSH 0.22 (L) 09/10/2019   TSH 1.10 11/06/2018   TSH 0.09 (L) 08/29/2018   TSH 0.92 11/27/2017   TSH 0.30 (L) 10/11/2017   TSH 0.24 (L) 08/11/2017   TSH 0.58 01/04/2017   TSH 0.42 08/09/2016   TSH 1.060 01/11/2016   She also has history of SLE and Sjogren's syndrome.  She has generalized aches from these.  We checked her hormones in the setting of hirsutism and acne and they were normal: Component     Latest Ref Rng & Units 09/02/2014 09/08/2014  Testosterone     3 - 41 ng/dL 59 (H)   Testosterone Free     0.0 - 4.2 pg/mL 2.1   FSH     mIU/mL  68.6  LH     mIU/mL  43.4  DHEA-SO4     7 - 177 ug/dL 49   Cortisol, Plasma     ug/dL  1.3  (Normal Dexamethasone suppression test)   Estradiol     pg/mL  24.0   Also: Component     Latest Ref Rng & Units 01/04/2017  Cortisol - AM     mcg/dL 17.6  C206 ACTH     6 - 50 pg/mL 15  No signs of adrenal efficiency  ROS: Constitutional: no weight gain/no weight loss, +  fatigue, no subjective hyperthermia, no subjective hypothermia Eyes: no blurry vision, no xerophthalmia ENT: no sore throat, no nodules palpated in neck, no dysphagia, no odynophagia, no hoarseness Cardiovascular: no CP/no SOB/no palpitations/no leg swelling Respiratory: no cough/no SOB/no wheezing Gastrointestinal: no N/no V/no D/no C/no acid reflux Musculoskeletal: + Muscle aches/+ joint aches Skin: no rashes, no hair loss Neurological: no tremors/no numbness/no tingling/no dizziness  I reviewed pt's medications, allergies, PMH, social hx, family hx, and changes were documented in the history of present illness. Otherwise, unchanged from my initial visit note.  Past Medical History:  Diagnosis Date  . Allergy   . ANA positive    positive ANA pattern 1 speckled  . Arthritis   . Carotid stenosis, asymptomatic 06/19/2015   7-32% RICA 20-25% LICA rpt 1 yr (12/2704)   . Dermatomyositis ([place])   . Diabetes mellitus without complication (HCC)    Type 1  . Family history of adverse reaction to anesthesia    brothr went into cardiac arrest from anectine  . Fibromyalgia    prior PCP  . GERD (gastroesophageal reflux disease)    prior PCP  . Glaucoma    Narrow angle  . History of blood clots    DVT, in 20s, none since  . History of chicken pox   . History of diverticulitis   . History of pericarditis 1986   with hospitalization  . History of pneumonia 2014  . History of shingles   . History of UTI   . Hyperlipidemia   . Hypertension   . Hypothyroidism   . Mixed connective tissue disease ([place])   . PONV (postoperative nausea and vomiting)   . Raynaud's disease without gangrene   . Shoulder pain left    h/o RTC tendonitis and adhesive capsulitis  . Sjogren's syndrome ([place])   . Sleep apnea    prior PCP - no CPAP for about 10 yrs  . 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[place])   . Vitamin D deficiency    prior PCP   Past Surgical History:  Procedure Laterality Date  . ABDOMINAL HYSTERECTOMY  1978   fibroids and menorrhagia, ovaries remain  . ARTERY BIOPSY Right 04/06/2018   Procedure: BIOPSY TEMPORAL ARTERY RIGHT;  Surgeon: Beverly Gust, MD;  Location: [facility];  Service: ENT;  Laterality: Right;  Diabetic - insulin pump sleep apnea  . [facility] normal per patient  . COLONOSCOPY  10/2011   1 TA, 1 HP, very tortuous colon ([place])  . COLONOSCOPY WITH ESOPHAGOGASTRODUODENOSCOPY (EGD)  03/2007   2 ulcers, benign polyp, rpt 5 yrs ([place], [place])  . PARTIAL HIP ARTHROPLASTY  2013   Right hip replacement  . TONSILLECTOMY AND ADENOIDECTOMY    . TRANSCAROTID ARTERY REVASCULARIZATION Left 07/23/2018   Procedure: TRANSCAROTID ARTERY REVASCULARIZATION;  Surgeon: Marty Heck, MD;  Location: [facility];  Service: Vascular;  Laterality: Left;  . TUBAL LIGATION    . VAGINAL DELIVERY     x2, no complications   Social History   Social History  . Widowed   . Number of children: 2   Occupational History  Retired    Social History Main Topics  . Smoking status: Never Smoker  . Smokeless tobacco: Never Used  . Alcohol use No  . Drug use: No   Social History Narrative   Lives in [place] now. Recently moved from [place].   No pets.   Mother of Hasana Alcorta.   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 aspirin EC 81 MG tablet Take 81 mg by mouth daily.     . benzocaine (ORAJEL) 10 % mucosal gel Use as directed 1 application in the mouth or throat 4 (four) times daily as needed for mouth pain.    Marland Kitchen clopidogrel (PLAVIX) 75 MG tablet Take 1 tablet (75 mg total) by mouth daily. 30 tablet 6  . Continuous Blood Gluc Receiver (DEXCOM G6 RECEIVER) DEVI 1 kit by Does not apply route See admin instructions. Use to check blood sugar 4 times a day 1 each 0  . Continuous Blood Gluc Sensor (DEXCOM G6 SENSOR) MISC 1 each by Does not apply route See admin instructions. Change sensor every 10 days 3 each 3  . Continuous Blood Gluc Transmit (DEXCOM G6 TRANSMITTER) MISC 1 kit by Does not apply route See admin instructions. Use to check blood sugar 1 each 1  . diclofenac sodium (VOLTAREN) 1 % GEL Apply 1 application topically 3 (three) times daily. 1 Tube 1  . glucagon (GLUCAGON EMERGENCY) 1 MG injection Inject 1 mg into the muscle once as needed for up to 1 dose. 1 each 12  . insulin aspart (NOVOLOG) 100 UNIT/ML FlexPen Inject 6-10 Units into the skin 3 (three) times daily with meals. 15 mL 11  . Insulin Degludec (TRESIBA FLEXTOUCH) 200 UNIT/ML SOPN Inject 30 Units into the skin daily.    . Insulin Pen Needle 32G X 4 MM MISC Use 4-5x a day 300 each 3  . Krill Oil 500 MG CAPS Take 2 capsules (1,000 mg total) by mouth daily. (Patient taking differently: Take 500 mg by mouth daily. )    . lisinopril (ZESTRIL) 10 MG tablet Take 1 tablet (10 mg total) by mouth daily. 90 tablet 0  . metoprolol succinate (TOPROL-XL) 25 MG 24 hr tablet TAKE 1 TABLET(25 MG) BY MOUTH DAILY 30 tablet 11  . neomycin-polymyxin-dexamethasone (MAXITROL) 0.1 % ophthalmic suspension Place 1 drop into both eyes daily.    . nitroGLYCERIN (NITROSTAT) 0.4 MG SL tablet Place 1 tablet (0.4 mg total) under the tongue every 5 (five) minutes as needed for chest pain. 30 tablet 1  . pantoprazole (PROTONIX) 40 MG tablet Take 1 tablet (40 mg total) by mouth daily as needed (GERD).    Marland Kitchen rosuvastatin (CRESTOR) 10  MG tablet Take 1 tablet (10 mg total) by mouth daily. 30 tablet 5  . SYNTHROID 75 MCG tablet TAKE 1 TABLET BY MOUTH ONCE DAILY FOR 6 DAYS AND 1 AND 1/2 TABLETS ON THE 7TH DAY 105 tablet 3  . traMADol (ULTRAM) 50 MG tablet Take 0.5-1 tablets (25-50 mg total) by mouth 2 (two) times daily as needed for up to 5 days for moderate pain. 15 tablet 0  . BD ULTRA-FINE LANCETS lancets Use as instructed upto 6 times daily (Patient not taking: Reported on 10/08/2019) 600 each 2  . glucose blood (CONTOUR NEXT TEST) test strip Use as instructed to check 4 times daily (Patient not taking: Reported on 10/08/2019) 400 each 5   No current facility-administered medications on file prior to visit.   Allergies  Allergen Reactions  . Iodinated Diagnostic Agents Other (See Comments)    Itching (severe) and chest tightness  .  Penicillins Anaphylaxis, Swelling, Rash and Other (See Comments)    Has patient had a PCN reaction causing immediate rash, facial/tongue/throat swelling, SOB or lightheadedness with hypotension: Yes Has patient had a PCN reaction causing severe rash involving mucus membranes or skin necrosis: Unknown Has patient had a PCN reaction that required hospitalization: Unknown Has patient had a PCN reaction occurring within the last 10 years: No If all of the above answers are "NO", then may proceed with Cephalosporin use.   . Amlodipine Swelling    Pedal edema  . Anectine [Succinylcholine] Other (See Comments)    Brother went into cardiac arrest.  . Codeine Nausea Only  . Gabapentin Other (See Comments)    Gait abnormality  . Influenza Vaccines Other (See Comments)    Muscle weakness; unable to walk  . Nortriptyline Other (See Comments)    Eye swelling and mouth drawed up  . Pamelor [Nortriptyline Hcl] Other (See Comments)    Patient states caused her face to draw in together.  . Valsartan Other (See Comments) and Cough    Allergy to generic only, "Hacking" cough  . Erythromycin Rash and  Swelling  . Sulfa Antibiotics Rash   Family History  Problem Relation Age of Onset  . CAD Mother 48       MI, aortic valve issues  . COPD Mother   . Lupus Mother   . Graves' disease Mother   . Rheum arthritis Mother   . CAD Father 65       CABG x2, aortic valve replacement  . Stroke Sister   . Alcohol abuse Brother   . CAD Brother 76       MI  . Stroke Brother   . Seizures Son   . COPD Brother   . CAD Brother 9       stent  . Diabetes Brother   . Depression Grandchild   . Cancer Maternal Aunt        breast  . Breast cancer Maternal Aunt   . Diabetes Sister        deceased  . CAD Sister 55       stents  . Breast cancer Maternal Aunt    PE: BP 120/70   Pulse 95   Ht 5' 3.5" (1.613 m)   Wt 157 lb (71.2 kg)   SpO2 97%   BMI 27.38 kg/m  Wt Readings from Last 3 Encounters:  10/08/19 157 lb (71.2 kg)  10/07/19 157 lb 7 oz (71.4 kg)  09/13/19 155 lb 8 oz (70.5 kg)   Constitutional: Normal weight, in NAD Eyes: PERRLA, EOMI, no exophthalmos ENT: moist mucous membranes, no thyromegaly, no cervical lymphadenopathy Cardiovascular: Tachycardia, RR, No MRG Respiratory: CTA B Gastrointestinal: abdomen soft, NT, ND, BS+ Musculoskeletal: no deformities, strength intact in all 4 Skin: moist, warm, no rashes Neurological: no tremor with outstretched hands, DTR normal in all 4  ASSESSMENT: 1. DM1, uncontrolled, without long-term complications, but with hyperglycemia  2.  Hashimoto's hypothyroidism  3. HL  PLAN:  1. Patient with longstanding, uncontrolled LADA, previously on insulin pump but of the pump due to problems obtaining her supplies from [facility].  For the last 2 visits she has been on basal/bolus insulin regimen, with poor control.  Her sugars are fluctuating, but mostly staying above target.  HbA1c fluctuated between 9.7 and 9.8%.  Since last visit, she was able to obtain another insulin pump (t slim x2) and a Dexcom CGM and she is ready to attach these.  At  this  visit, we discussed with the diabetes educator who is able to see her tomorrow.  Therefore, I advised her to hold her Tresiba dose tomorrow morning in preparation for starting the pump at 12 PM. -For now, we can continue the same bolus settings.  She was using carb equivalent in the past to bolus for meals despite advice to use insulin to carb ratios.  At this visit she tells me that she is using both.  I think her after meal sugars will increase significantly after allowing the pump to calculate her boluses based on insulin to carb ratios.  We can start with 1-8 insulin to carb ratios and adjust them based on her postprandial sugars afterwards. - I advised her to:  Patient Instructions  Please start the t: slim pump tomorrow.  Please hold [name] tomorrow am.  - Novolog: ICR: 1:8 (starches)-1:10 (other foods) Target: 130 Insulin sensitivity factor: 20 If you correct sugars at bedtime, only correct >200 and only give 2 to 3 units.  Please decrease Levothyroxine to 75 mcg 6/7 days.  Take the thyroid hormone every day, with water, at least 30 minutes before breakfast, separated by at least 4 hours from: - acid reflux medications - calcium - iron - multivitamins  Please return in 1 month - 40 min.  - we checked her HbA1c: 9.8% (slightly higher) - advised to check sugars at different times of the day - 4x a day, rotating check times - advised for yearly eye exams >> she is UTD - return to clinic in 3 months    2.  Hashimoto's hypothyroidism - latest thyroid labs reviewed with pt >> low TSH at her check yesterday: Lab Results  Component Value Date   TSH 0.12 (L) 10/07/2019   - she continues on Synthroid d.a.w. 75 mcg 6 out of 7 days and 75 + 37.5 mcg 1 out of 7 days (equivalent of 80 mcg daily).  - We will need to decrease the dose to 64 mcg daily (75 mcg 6 out of 7 days) and recheck in 1.5 months. - She takes thyroid hormone correctly: Every day, with water, >30 minutes before  breakfast, separated by >4 hours from acid reflux medications, calcium, iron, multivitamins.   3. Hl -Reviewed latest lipid panel from 08/2019: LDL above target, as are her triglycerides: Lab Results  Component Value Date   CHOL 212 (H) 09/10/2019   HDL 40.40 09/10/2019   LDLCALC 141 (H) 09/10/2019   LDLDIRECT 177.0 08/09/2016   TRIG 153.0 (H) 09/10/2019   CHOLHDL 5 09/10/2019  -She continues Crestor low-dose-she has muscle aches with it  Philemon Kingdom, MD PhD [facility]

## 2019-10-08 NOTE — Patient Instructions (Addendum)
Please start the t: slim pump tomorrow.  Please hold Cassandra Benson tomorrow am.  - Novolog: ICR: 1:8 (starches)-1:10 (other foods) Target: 130 Insulin sensitivity factor: 20 If you correct sugars at bedtime, only correct >200 and only give 2 to 3 units.  Please decrease Levothyroxine to 75 mcg 6/7 days.  Take the thyroid hormone every day, with water, at least 30 minutes before breakfast, separated by at least 4 hours from: - acid reflux medications - calcium - iron - multivitamins  Please return in 1 month - 40 min.

## 2019-10-09 ENCOUNTER — Encounter: Payer: PPO | Attending: Internal Medicine | Admitting: Nutrition

## 2019-10-09 DIAGNOSIS — E139 Other specified diabetes mellitus without complications: Secondary | ICD-10-CM | POA: Insufficient documentation

## 2019-10-10 ENCOUNTER — Encounter: Payer: PPO | Admitting: Nutrition

## 2019-10-10 DIAGNOSIS — E1165 Type 2 diabetes mellitus with hyperglycemia: Secondary | ICD-10-CM

## 2019-10-10 NOTE — Patient Instructions (Addendum)
Download the Dexcom G6 mobil app, and the T-Connect app to her phone Return tomorrow at [employer] over handouts given

## 2019-10-10 NOTE — Progress Notes (Signed)
This patient was trained on how to use the Tandem Control IQ pump. Settings were put in per Dr. Arman Filter orders:  Basal rate:MN: 0.8, 3AM: 0.875, 9:30AM: 0.5, 5PM: 1.5, 6:30PM: 1.3, and 11:30PM: 1.8.  I/C: 8, ISF: 20. She did not take her [name] last night, and so, she filled a cartridge with Humalog insulin, and attached an 62mm infusion set to her right abdomen.  She was shown how to give a bolus, and redemonstrated this X2 correctly. She was also started on the Dexcom, and we linked the readings to her pump.  Control IQ was turned on with a total of 52u/day put into the pump settings.  This may need to be changed at a later time.   She was given handouts: how to give the bolus, start/stop pump, change cartridge, change pump settings, control IQ and Dexcom linking instructions, and symbol interpretations.  She had no final questions.   She was not able to download either the Dexcom app or the T-connect app, because she was not able to remember her phone password.  So we were not able to link her to our system. She will do this tonight and return tomorrow to see me for further training, and to register her pump her and link it to the cloud.  Written instructions were given for her to do this at home and return tomorrow at 10AM

## 2019-10-12 NOTE — Patient Instructions (Addendum)
Download Dexcom G6 mobil app, and T:connect mobil app. Return next Tuesday for another appointment.

## 2019-10-12 NOTE — Progress Notes (Signed)
Patient was sent home yesterday with written instructions to download the Dexcom and T connect apps.  She did not do this and told me that I never showed her.  She did not bring her note pad, but the brochure was in there with directions, and she then said she remembered.  She does no know her user name for apple, and so we tried to reset her password, but she does not have email to her phone, nor a charge card to do this.  Her sister was here today, and she tried to help with this.  90 min. Were taken to try this, but failed.  She will go home and ask her son to help her with this.  She had a low yesterday, but reported no difficulty giving the boluses.  FBS today was 156, much better than on injections.  She was pleased with this. WE downloaded her pump and set up an account with our system, but she will not be able to download her pump from home, and was encouraged again to try to download the 2 apps.  Written instructions were again given to her to do this when she gets home.  She will return next week for final review of all pump topics.  Patient very upset and not able to absorb any more information at this time.   She reports good understanding of how to bolus, and disconnect from this pump, as well as responding to the alerts and alarms.  We will review all of thin at her next appointment next week.   We made changes to her pump settings per Dr. Arman Filter orders:  Basal rate: MN: .8, 3AM: .[id number], 9:30AM: 0.5, 5AM: 1.5 to 1.3, 6:30PM: 1.3 to 1.2, 11:30: 1.8 to 1.5.  The ISF was changed at 8PM from 20 to 78. She will call me Monday to schedule an appointment.

## 2019-10-15 ENCOUNTER — Encounter: Payer: PPO | Admitting: Nutrition

## 2019-10-15 ENCOUNTER — Encounter: Payer: Self-pay | Admitting: Family Medicine

## 2019-10-15 ENCOUNTER — Other Ambulatory Visit: Payer: Self-pay

## 2019-10-15 DIAGNOSIS — E139 Other specified diabetes mellitus without complications: Secondary | ICD-10-CM | POA: Diagnosis not present

## 2019-10-16 NOTE — Telephone Encounter (Signed)
FYI to PCP. Advised patient to call and make an appointment to discuss upon Dr Synthia Innocent return

## 2019-10-16 NOTE — Progress Notes (Signed)
Patient is here today with sister, but did not download the Dexcom app as instructed 3 times.  She appears very confused as to these directions, saying that she was never told to do this.  She was shown in her notebook, that it was written by her on 3 different days.  She says she will get her grandson again to download the Dexcom app.  We were not able because she did not know what her apple password was.  Stressed the need for this again with written directions for doing this.   She reports that she is bolusing without difficulty, and changing her cartridge and infusion sets without problems.  Pump download shows this to be true.  She reports that her blood sugar was low acS last night, and she treated this appropriately and FBS today was 126.  Download showed reading of 80 at HS. She appeared pleased.  Download shows basal rates to be very steady, with no lows. These were sent to scanning.  Per discussion with Dr. Cruzita Lederer, the appointment desk was notified to call her to reschedule her appointment with Dr. Cruzita Lederer for 1 month.   She was told to call if questions or problems.   WE reviewed all topics on checklist and she signed this, as indicating that she understands all topics.

## 2019-10-16 NOTE — Patient Instructions (Signed)
Continue to read manual and checklist given

## 2019-10-22 ENCOUNTER — Ambulatory Visit (INDEPENDENT_AMBULATORY_CARE_PROVIDER_SITE_OTHER): Payer: PPO | Admitting: Family Medicine

## 2019-10-22 ENCOUNTER — Other Ambulatory Visit: Payer: Self-pay

## 2019-10-22 ENCOUNTER — Encounter: Payer: Self-pay | Admitting: Family Medicine

## 2019-10-22 VITALS — BP 136/72 | HR 92 | Temp 98.1°F | Ht 63.5 in | Wt 156.1 lb

## 2019-10-22 DIAGNOSIS — R4189 Other symptoms and signs involving cognitive functions and awareness: Secondary | ICD-10-CM | POA: Insufficient documentation

## 2019-10-22 DIAGNOSIS — G459 Transient cerebral ischemic attack, unspecified: Secondary | ICD-10-CM

## 2019-10-22 DIAGNOSIS — G3184 Mild cognitive impairment, so stated: Secondary | ICD-10-CM | POA: Diagnosis not present

## 2019-10-22 DIAGNOSIS — I6522 Occlusion and stenosis of left carotid artery: Secondary | ICD-10-CM

## 2019-10-22 DIAGNOSIS — R413 Other amnesia: Secondary | ICD-10-CM

## 2019-10-22 DIAGNOSIS — R519 Headache, unspecified: Secondary | ICD-10-CM

## 2019-10-22 NOTE — Assessment & Plan Note (Addendum)
Followed by [facility], s/p LICA stent [id number].  Latest carotid US reassuring 08/2019

## 2019-10-22 NOTE — Progress Notes (Signed)
This visit was conducted in person.  BP 136/72 (BP Location: Left Arm, Patient Position: Sitting, Cuff Size: Normal)   Pulse 92   Temp 98.1 F (36.7 C) (Temporal)   Ht 5' 3.5" (1.613 m)   Wt 156 lb 2 oz (70.8 kg)   SpO2 98%   BMI 27.22 kg/m    CC: geriatric assessment Subjective:    Patient ID: Cassandra Benson, female    DOB: Dec 13, 1940, 79 y.o.   MRN: [id number]  HPI: Cassandra Benson is a 79 y.o. female presenting on 10/22/2019 for Memory Loss (C/o issues with short term memory and occasional confusion.  States family members have noticed also about 2-3 mos ago. )   Specifically noted trouble last week when she went to nutritionist - felt very confused, did not remember instructions she had been given several times previously. Family (sister, children) has noticed memory issues over the last few months.   R sided HA worse in the last 2 weeks. Describes pain that starts R occipital region with radiation through head to forehead - sharp and burning pain. Treats with tylenol 661m with mild benefit.   She has had some hypoglycemic events recently - down to 40.  Thyroid dosing was adjusted for recent low TSH (endo).   She thinks she does have dementia in the family, but doesn't remember who.   Geriatric Assessment: Activities of Daily Living:     Bathing- independent    Dressing- independent    Eating- independent    Toileting- independent    Transferring- independent    Continence- independent Overall Assessment: independent  Instrumental Activities of Daily Living:     Transportation- independent    Meal/Food Preparation- independent    Shopping Errands- independent    Housekeeping/Chores- independent    Money Management/Finances- independent    Medication Management- independent    Ability to Use Telephone- independent    Laundry- independent Overall Assessment: independent  Mental Status Exam: 28/30 (value/max value)     Clock Drawing Score: 2/4      Relevant past medical, surgical, family and social history reviewed and updated as indicated. Interim medical history since our last visit reviewed. Allergies and medications reviewed and updated. Outpatient Medications Prior to Visit  Medication Sig Dispense Refill  . aspirin EC 81 MG tablet Take 81 mg by mouth daily.    . BD ULTRA-FINE LANCETS lancets Use as instructed upto 6 times daily 600 each 2  . benzocaine (ORAJEL) 10 % mucosal gel Use as directed 1 application in the mouth or throat 4 (four) times daily as needed for mouth pain.    .Marland Kitchenclopidogrel (PLAVIX) 75 MG tablet Take 1 tablet (75 mg total) by mouth daily. 30 tablet 6  . Continuous Blood Gluc Receiver (DEXCOM G6 RECEIVER) DEVI 1 kit by Does not apply route See admin instructions. Use to check blood sugar 4 times a day 1 each 0  . Continuous Blood Gluc Sensor (DEXCOM G6 SENSOR) MISC 1 each by Does not apply route See admin instructions. Change sensor every 10 days 3 each 3  . Continuous Blood Gluc Transmit (DEXCOM G6 TRANSMITTER) MISC 1 kit by Does not apply route See admin instructions. Use to check blood sugar 1 each 1  . diclofenac sodium (VOLTAREN) 1 % GEL Apply 1 application topically 3 (three) times daily. 1 Tube 1  . glucagon (GLUCAGON EMERGENCY) 1 MG injection Inject 1 mg into the muscle once as needed for up to 1 dose. 1 each  12  . glucose blood (CONTOUR NEXT TEST) test strip Use as instructed to check 4 times daily 400 each 5  . insulin aspart (NOVOLOG) 100 UNIT/ML FlexPen Inject 6-10 Units into the skin 3 (three) times daily with meals. 15 mL 11  . Insulin Pen Needle 32G X 4 MM MISC Use 4-5x a day 300 each 3  . Krill Oil 500 MG CAPS Take 2 capsules (1,000 mg total) by mouth daily. (Patient taking differently: Take 500 mg by mouth daily. )    . lisinopril (ZESTRIL) 10 MG tablet Take 1 tablet (10 mg total) by mouth daily. 90 tablet 0  . metoprolol succinate (TOPROL-XL) 25 MG 24 hr tablet TAKE 1 TABLET(25 MG) BY MOUTH DAILY  30 tablet 11  . neomycin-polymyxin-dexamethasone (MAXITROL) 0.1 % ophthalmic suspension Place 1 drop into both eyes daily.    . nitroGLYCERIN (NITROSTAT) 0.4 MG SL tablet Place 1 tablet (0.4 mg total) under the tongue every 5 (five) minutes as needed for chest pain. 30 tablet 1  . pantoprazole (PROTONIX) 40 MG tablet Take 1 tablet (40 mg total) by mouth daily as needed (GERD).    Marland Kitchen rosuvastatin (CRESTOR) 10 MG tablet Take 1 tablet (10 mg total) by mouth daily. 30 tablet 5  . SYNTHROID 75 MCG tablet TAKE 1 TABLET BY MOUTH ONCE DAILY FOR 6 DAYS AND 1 AND 1/2 TABLETS ON THE 7TH DAY 105 tablet 3  . Insulin Degludec (TRESIBA FLEXTOUCH) 200 UNIT/ML SOPN Inject 30 Units into the skin daily.     No facility-administered medications prior to visit.     Per HPI unless specifically indicated in ROS section below Review of Systems Objective:    BP 136/72 (BP Location: Left Arm, Patient Position: Sitting, Cuff Size: Normal)   Pulse 92   Temp 98.1 F (36.7 C) (Temporal)   Ht 5' 3.5" (1.613 m)   Wt 156 lb 2 oz (70.8 kg)   SpO2 98%   BMI 27.22 kg/m   Wt Readings from Last 3 Encounters:  10/22/19 156 lb 2 oz (70.8 kg)  10/08/19 157 lb (71.2 kg)  10/07/19 157 lb 7 oz (71.4 kg)    Physical Exam Vitals and nursing note reviewed.  Constitutional:      Appearance: Normal appearance. She is not ill-appearing.  HENT:     Head: Normocephalic and atraumatic.     Mouth/Throat:     Mouth: Mucous membranes are moist.     Pharynx: Oropharynx is clear.  Eyes:     Extraocular Movements: Extraocular movements intact.     Pupils: Pupils are equal, round, and reactive to light.  Neck:     Vascular: Carotid bruit present.  Musculoskeletal:     Right lower leg: No edema.     Left lower leg: No edema.  Skin:    General: Skin is warm and dry.     Findings: No rash.  Neurological:     General: No focal deficit present.     Mental Status: She is alert.     Cranial Nerves: Cranial nerves are intact.      Sensory: Sensation is intact.     Coordination: Romberg sign negative.     Gait: Gait is intact.     Comments:  CN 2-12 intact FTN intact EOMI Mildly unsteady with romberg but not ataxic No pronator drift  Psychiatric:        Mood and Affect: Mood normal.        Behavior: Behavior normal.  Results for orders placed or performed in visit on 10/08/19  POCT HgB A1C  Result Value Ref Range   Hemoglobin A1C 9.8 (A) 4.0 - 5.6 %   HbA1c POC (<> result, manual entry)     HbA1c, POC (prediabetic range)     HbA1c, POC (controlled diabetic range)     Lab Results  Component Value Date   VITAMINB12 688 02/07/2018    Lab Results  Component Value Date   TSH 0.12 (L) 10/07/2019   Lab Results  Component Value Date   WBC 6.4 10/07/2019   HGB 13.6 10/07/2019   HCT 41.1 10/07/2019   MCV 80.9 10/07/2019   PLT 221.0 10/07/2019    Lab Results  Component Value Date   CREATININE 0.96 10/07/2019   BUN 15 10/07/2019   NA 137 10/07/2019   K 4.4 10/07/2019   CL 104 10/07/2019   CO2 26 10/07/2019    Lab Results  Component Value Date   ALT 11 10/07/2019   AST 13 10/07/2019   ALKPHOS 70 10/07/2019   BILITOT 0.4 10/07/2019    Assessment & Plan:  This visit occurred during the SARS-CoV-2 public health emergency.  Safety protocols were in place, including screening questions prior to the visit, additional usage of staff PPE, and extensive cleaning of exam room while observing appropriate contact time as indicated for disinfecting solutions.   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 Discussed dementia vs MCI - anticipate MCI given memory at this time not affecting ability to function. Reviewed lifestyle factors to optimize memory function including maintaining nutritious diet, regular physical  activity, keeping mind active with reading and puzzles, and social engagement. Discussed how hypoglycemia and abnormal thyroid function can contribute to memory loss.  Will check labs (B12, RPR) and MRI brain with worsening HA and in known carotid stenosis and TIA history to r/o possible vascular etiology of worsening memory trouble.  RTC 1 mo f/u visit. Consider aricept trial. Pt agrees with plan.       Relevant Orders   RPR   Vitamin B12   MR Brain Wo Contrast   Intractable episodic headache    Worsening - update brain imaging.       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  Latest carotid US reassuring 08/2019       Other Visit Diagnoses    Short-term memory loss       Relevant Orders   RPR   Vitamin B12   MR Brain Wo Contrast       No orders of the defined types were placed in this encounter.  Orders Placed This Encounter  Procedures  . MR Brain Wo Contrast    Standing Status:   Future    Standing Expiration Date:   12/19/2020    Order Specific Question:   ** REASON FOR EXAM (FREE TEXT)    Answer:   cognitive impairment with memory loss, HA, known carotid disease    Order Specific Question:   What is the patient's sedation requirement?    Answer:   No Sedation    Order Specific Question:   Does the patient have a pacemaker or implanted  devices?    Answer:   No    Order Specific Question:   Preferred imaging location?    Answer:   [facility] (table limit-400lbs)    Order Specific Question:   Radiology Contrast Protocol - do NOT remove file path    Answer:   [email or web address]  . RPR  . Vitamin B12    Follow up plan: Return in about 4 weeks (around 11/19/2019), or if symptoms worsen or fail to improve.  [name], MD

## 2019-10-22 NOTE — Patient Instructions (Addendum)
Good to see you today Memory testing was borderline today.  I want to further evaluate with labwork today  I will order brain MRI to evaluate for other causes of headache and memory trouble.  Work on good water intake, nutritious diet, physical activity, stay mentally engaged (reading books, doing memory or crossword or math puzzles), and continue social engagement as safely as able.  Return in 4-6 weeks for follow up visit.   Memory Compensation Strategies  1. Use "WARM" strategy.  W= write it down  A= associate it  R= repeat it  M= make a mental note  2.   You can keep a Social worker.  Use a 3-ring notebook with sections for the following: calendar, important names and phone numbers,  medications, doctors' names/phone numbers, lists/reminders, and a section to journal what you did  each day.   3.    Use a calendar to write appointments down.  4.    Write yourself a schedule for the day.  This can be placed on the calendar or in a separate section of the Memory Notebook.  Keeping a  regular schedule can help memory.  5.    Use medication organizer with sections for each day or morning/evening pills.  You may need help loading it  6.    Keep a basket, or pegboard by the door.  Place items that you need to take out with you in the basket or on the pegboard.  You may also want to  include a message board for reminders.  7.    Use sticky notes.  Place sticky notes with reminders in a place where the task is performed.  For example: " turn off the  stove" placed by the stove, "lock the door" placed on the door at eye level, " take your medications" on  the bathroom mirror or by the place where you normally take your medications.  8.    Use alarms/timers.  Use while cooking to remind yourself to check on food or as a reminder to take your medicine, or as a  reminder to make a call, or as a reminder to perform another task, etc.

## 2019-10-22 NOTE — Assessment & Plan Note (Signed)
Worsening - update brain imaging.

## 2019-10-22 NOTE — Assessment & Plan Note (Addendum)
MMSE test today borderline abnormal, CDT abnormal. Discussed dementia vs MCI - anticipate MCI given memory at this time not affecting ability to function. Reviewed lifestyle factors to optimize memory function including maintaining nutritious diet, regular physical activity, keeping mind active with reading and puzzles, and social engagement. Discussed how hypoglycemia and abnormal thyroid function can contribute to memory loss.  Will check labs (B12, RPR) and MRI brain with worsening HA and in known carotid stenosis and TIA history to r/o possible vascular etiology of worsening memory trouble.  RTC 1 mo f/u visit. Consider aricept trial. Pt agrees with plan.

## 2019-10-23 ENCOUNTER — Encounter: Payer: Self-pay | Admitting: Family Medicine

## 2019-10-23 LAB — SYPHILIS: RPR W/REFLEX TO RPR TITER AND TREPONEMAL ANTIBODIES, TRADITIONAL SCREENING AND DIAGNOSIS ALGORITHM: RPR Ser Ql: NONREACTIVE

## 2019-10-23 LAB — VITAMIN B12: Vitamin B-12: 506 pg/mL (ref 211–911)

## 2019-10-24 DIAGNOSIS — M3500 Sicca syndrome, unspecified: Secondary | ICD-10-CM | POA: Diagnosis not present

## 2019-10-24 DIAGNOSIS — E119 Type 2 diabetes mellitus without complications: Secondary | ICD-10-CM | POA: Diagnosis not present

## 2019-10-24 DIAGNOSIS — L700 Acne vulgaris: Secondary | ICD-10-CM | POA: Diagnosis not present

## 2019-10-24 DIAGNOSIS — Z794 Long term (current) use of insulin: Secondary | ICD-10-CM | POA: Diagnosis not present

## 2019-10-24 DIAGNOSIS — D1800 Hemangioma unspecified site: Secondary | ICD-10-CM | POA: Diagnosis not present

## 2019-10-25 MED ORDER — DIAZEPAM 5 MG PO TABS: 2.5000 mg | ORAL_TABLET | Freq: Two times a day (BID) | ORAL | 0 refills | Status: DC | PRN

## 2019-10-25 NOTE — Addendum Note (Signed)
Addended by: [name] on: 10/25/2019 05:49 PM   Modules accepted: Orders

## 2019-10-30 ENCOUNTER — Encounter: Payer: Self-pay | Admitting: Internal Medicine

## 2019-10-31 DIAGNOSIS — E109 Type 1 diabetes mellitus without complications: Secondary | ICD-10-CM | POA: Diagnosis not present

## 2019-11-01 ENCOUNTER — Other Ambulatory Visit: Payer: Self-pay

## 2019-11-01 ENCOUNTER — Ambulatory Visit
Admission: RE | Admit: 2019-11-01 | Discharge: 2019-11-01 | Disposition: A | Discharge status: Home / Self Care | Payer: PPO | Source: Ambulatory Visit | Attending: Family Medicine | Admitting: Family Medicine

## 2019-11-01 DIAGNOSIS — G3184 Mild cognitive impairment, so stated: Secondary | ICD-10-CM | POA: Insufficient documentation

## 2019-11-01 DIAGNOSIS — R519 Headache, unspecified: Secondary | ICD-10-CM | POA: Insufficient documentation

## 2019-11-01 DIAGNOSIS — R413 Other amnesia: Secondary | ICD-10-CM | POA: Insufficient documentation

## 2019-11-01 DIAGNOSIS — F0390 Unspecified dementia without behavioral disturbance: Secondary | ICD-10-CM | POA: Diagnosis not present

## 2019-11-01 DIAGNOSIS — G459 Transient cerebral ischemic attack, unspecified: Secondary | ICD-10-CM | POA: Insufficient documentation

## 2019-11-03 ENCOUNTER — Encounter: Payer: Self-pay | Admitting: Family Medicine

## 2019-11-10 ENCOUNTER — Other Ambulatory Visit: Payer: Self-pay | Admitting: Family Medicine

## 2019-11-18 ENCOUNTER — Telehealth: Payer: Self-pay | Admitting: Internal Medicine

## 2019-11-18 NOTE — Telephone Encounter (Signed)
na

## 2019-11-22 ENCOUNTER — Other Ambulatory Visit: Payer: Self-pay

## 2019-11-25 ENCOUNTER — Emergency Department: Payer: PPO

## 2019-11-25 ENCOUNTER — Telehealth: Payer: Self-pay | Admitting: Internal Medicine

## 2019-11-25 ENCOUNTER — Encounter: Payer: Self-pay | Admitting: Emergency Medicine

## 2019-11-25 ENCOUNTER — Encounter: Payer: Self-pay | Admitting: Family Medicine

## 2019-11-25 ENCOUNTER — Ambulatory Visit: Payer: PPO | Admitting: Family Medicine

## 2019-11-25 ENCOUNTER — Other Ambulatory Visit: Payer: Self-pay

## 2019-11-25 ENCOUNTER — Emergency Department
Admission: EM | Admit: 2019-11-25 | Discharge: 2019-11-25 | Disposition: A | Discharge status: Home / Self Care | Payer: PPO | Attending: Emergency Medicine | Admitting: Emergency Medicine

## 2019-11-25 DIAGNOSIS — E039 Hypothyroidism, unspecified: Secondary | ICD-10-CM | POA: Insufficient documentation

## 2019-11-25 DIAGNOSIS — E1065 Type 1 diabetes mellitus with hyperglycemia: Secondary | ICD-10-CM | POA: Diagnosis not present

## 2019-11-25 DIAGNOSIS — R739 Hyperglycemia, unspecified: Secondary | ICD-10-CM | POA: Diagnosis not present

## 2019-11-25 DIAGNOSIS — Z7982 Long term (current) use of aspirin: Secondary | ICD-10-CM | POA: Diagnosis not present

## 2019-11-25 DIAGNOSIS — I10 Essential (primary) hypertension: Secondary | ICD-10-CM | POA: Diagnosis not present

## 2019-11-25 DIAGNOSIS — Z96641 Presence of right artificial hip joint: Secondary | ICD-10-CM | POA: Diagnosis not present

## 2019-11-25 DIAGNOSIS — Z7902 Long term (current) use of antithrombotics/antiplatelets: Secondary | ICD-10-CM | POA: Diagnosis not present

## 2019-11-25 DIAGNOSIS — Z8673 Personal history of transient ischemic attack (TIA), and cerebral infarction without residual deficits: Secondary | ICD-10-CM | POA: Diagnosis not present

## 2019-11-25 DIAGNOSIS — R05 Cough: Secondary | ICD-10-CM | POA: Diagnosis not present

## 2019-11-25 DIAGNOSIS — Z79899 Other long term (current) drug therapy: Secondary | ICD-10-CM | POA: Insufficient documentation

## 2019-11-25 DIAGNOSIS — Z20822 Contact with and (suspected) exposure to covid-19: Secondary | ICD-10-CM | POA: Insufficient documentation

## 2019-11-25 DIAGNOSIS — R059 Cough, unspecified: Secondary | ICD-10-CM

## 2019-11-25 LAB — CBC
HCT: 41 % (ref 36.0–46.0)
Hemoglobin: 13.5 g/dL (ref 12.0–15.0)
MCH: 26.9 pg (ref 26.0–34.0)
MCHC: 32.9 g/dL (ref 30.0–36.0)
MCV: 81.7 fL (ref 80.0–100.0)
Platelets: 210 10*3/uL (ref 150–400)
RBC: 5.02 MIL/uL (ref 3.87–5.11)
RDW: 13.8 % (ref 11.5–15.5)
WBC: 10.5 10*3/uL (ref 4.0–10.5)
nRBC: 0 % (ref 0.0–0.2)

## 2019-11-25 LAB — URINALYSIS, COMPLETE (UACMP) WITH MICROSCOPIC
Bacteria, UA: NONE SEEN
Bilirubin Urine: NEGATIVE
Glucose, UA: >=500 mg/dL — AB
Hgb urine dipstick: NEGATIVE
Ketones, ur: NEGATIVE mg/dL
Leukocytes,Ua: NEGATIVE
Nitrite: NEGATIVE
Protein, ur: NEGATIVE mg/dL
Specific Gravity, Urine: 1.008 (ref 1.005–1.030)
pH: 6 (ref 5.0–8.0)

## 2019-11-25 LAB — BASIC METABOLIC PANEL
Anion gap: 11 (ref 5–15)
BUN: 14 mg/dL (ref 8–23)
CO2: 20 mmol/L — ABNORMAL LOW (ref 22–32)
Calcium: 9 mg/dL (ref 8.9–10.3)
Chloride: 102 mmol/L (ref 98–111)
Creatinine, Ser: 0.98 mg/dL (ref 0.44–1.00)
GFR calc Af Amer: >60 mL/min (ref >60)
GFR calc non Af Amer: 56 mL/min — ABNORMAL LOW (ref >60)
Glucose, Bld: 542 mg/dL (ref 70–99)
Potassium: 4.2 mmol/L (ref 3.5–5.1)
Sodium: 133 mmol/L — ABNORMAL LOW (ref 135–145)

## 2019-11-25 LAB — GLUCOSE, CAPILLARY
Glucose-Capillary: 504 mg/dL (ref 70–99)
Glucose-Capillary: 426 mg/dL — ABNORMAL HIGH (ref 70–99)
Glucose-Capillary: 223 mg/dL — ABNORMAL HIGH (ref 70–99)

## 2019-11-25 LAB — POC SARS CORONAVIRUS 2 AG: SARS Coronavirus 2 Ag: NEGATIVE

## 2019-11-25 MED ORDER — INSULIN ASPART 100 UNIT/ML FLEXPEN: 6.0000 [IU] | PEN_INJECTOR | Freq: Three times a day (TID) | SUBCUTANEOUS | 11 refills | Status: DC

## 2019-11-25 MED ORDER — SODIUM CHLORIDE 0.9 % IV SOLN: 1000.0000 mL | Freq: Once | INTRAVENOUS | Status: DC

## 2019-11-25 MED ORDER — SODIUM CHLORIDE 0.9 % IV SOLN
1000.0000 mL | Freq: Once | INTRAVENOUS | Status: AC
  Administered 2019-11-25: 1000 mL via INTRAVENOUS

## 2019-11-25 MED ORDER — INSULIN ASPART 100 UNIT/ML ~~LOC~~ SOLN
10.0000 [IU] | Freq: Once | SUBCUTANEOUS | Status: AC
  Administered 2019-11-25: 10 [IU] via INTRAVENOUS
  Filled 2019-11-25: qty 1

## 2019-11-25 MED ORDER — SODIUM CHLORIDE 0.9% FLUSH: 3.0000 mL | Freq: Once | INTRAVENOUS | Status: DC

## 2019-11-25 NOTE — ED Triage Notes (Signed)
C/O hyperglycemia x 2 weeks.  Also c/o head cold x 5 days.

## 2019-11-25 NOTE — ED Notes (Signed)
Pt gave verbal consent for family to be updated. Family updated on plan of care and status.

## 2019-11-25 NOTE — Telephone Encounter (Signed)
MEDICATION: novalog  PHARMACY:  [id number] - [address], [place] Phone:  [phone]  Fax:  [phone]     IS THIS A 90 DAY SUPPLY : "I guess"  IS PATIENT OUT OF MEDICATION: yes  IF NOT; HOW MUCH IS LEFT:   LAST APPOINTMENT DATE: 10/08/19  NEXT APPOINTMENT DATE:@4 /03/2020  DO WE HAVE YOUR PERMISSION TO LEAVE A DETAILED MESSAGE: yes  OTHER COMMENTS:    **Let patient know to contact pharmacy at the end of the day to make sure medication is ready. **  ** Please notify patient to allow 48-72 hours to process**  **Encourage patient to contact the pharmacy for refills or they can request refills through [facility]**

## 2019-11-25 NOTE — Telephone Encounter (Signed)
Vaughan Basta,  I know you have been working with her on her Pump and GCM, can you give her a call?

## 2019-11-25 NOTE — Telephone Encounter (Signed)
10:58AM: Patient reports that she was nauseated yesterday, but not today.  She has a non productive cough, and is "very SOB".  I asked her to do another blood sugar readings for me.  The reading was 329 at 9:10 this morning, and is now (9:50AM) 393.  She was told to change out her infusion set, but refused.  Said she is in need of insulin, and had to go to the drug store to get a pen, and used that insulin to fill her cartridge 2 days ago.  I told her that I would leave a vial up front for her, but that she needed to change out her infusion set to rule out a bent canula and ketoacidosis.  She refused She says that she took her Dexcom off, because it was dropping her blood sugar too low about 3 weeks ago.  She needed assistance with a low, and decided this was causing her blood sugar to drop.  I explained that this does not affect the blood sugar reading, but that it only reads it.   She also says she has an appointment here with Dr. Cruzita Lederer tomorrow.  No appointment is on the schedule for this. I strongly recommended that she go to the ER, and offered to call her an ambulance.  She refused saying she will call her son or grandson right now for this.  I told her that I will call her back in 15 minutes to make sure she has done this.   11:25AM  Phone call.  Pt. Reports that she has called her sister, and that she is on her way to pick her up to go to the hospital.

## 2019-11-25 NOTE — Telephone Encounter (Signed)
RX sent

## 2019-11-25 NOTE — ED Notes (Signed)
Esign not working pt verbalized discharge instruction and has no questions at this time.

## 2019-11-25 NOTE — Telephone Encounter (Signed)
Patient ph# [phone] called re: Patient wants Dr. Cruzita Lederer to know that patient is not feeling well. On Saturday 11/23/19 patient had a blood sugar reading of 422. On Sunday (11/24/19) Patient's blood sugars ran between 422 down to 296 (at 10:22 am)-then spiked again to 392 then went down to 288. Patient requests to be called at the ph# listed above.

## 2019-11-25 NOTE — Telephone Encounter (Signed)
She had an appt tomorrow - I saw her on the list, but she is off the list now... I am not sure but it may be safer to come off the pump and the CGM in the future. I will discuss with her at her next visit.

## 2019-11-25 NOTE — Addendum Note (Signed)
Addended by: Cardell Peach I on: 11/25/2019 04:18 PM   Modules accepted: Orders

## 2019-11-25 NOTE — ED Provider Notes (Signed)
[facility] Emergency Department Provider Note   ____________________________________________    I have reviewed the triage vital signs and the nursing notes.   HISTORY  Chief Complaint Hyperglycemia     HPI Cassandra Benson is a 78 y.o. female with a history of type 1 diabetes who presents with elevated blood sugars.  Patient reports her blood sugars have been elevated for nearly a week which is highly concerning to her.  She reports she has had a cough as well but no real fevers.  No chest pain.  Does have an insulin pump and reports that typically her glucose is very well controlled.  She is very experienced in controlling her diabetes.  She did recently switch to a new insulin pump 2 weeks ago.  Past Medical History:  Diagnosis Date  . Allergy   . ANA positive    positive ANA pattern 1 speckled  . Arthritis   . Carotid stenosis, asymptomatic 06/19/2015   7-09% RICA 64-38% LICA rpt 1 yr (11/8182)   . Dermatomyositis ([place])   . Diabetes mellitus without complication (HCC)    Type 1  . Family history of adverse reaction to anesthesia    brothr went into cardiac arrest from anectine  . Fibromyalgia    prior PCP  . GERD (gastroesophageal reflux disease)    prior PCP  . Glaucoma    Narrow angle  . History of blood clots    DVT, in 20s, none since  . History of chicken pox   . History of diverticulitis   . History of pericarditis 1986   with hospitalization  . History of pneumonia 2014  . History of shingles   . History of UTI   . Hyperlipidemia   . Hypertension   . Hypothyroidism   . Mixed connective tissue disease ([place])   . PONV (postoperative nausea and vomiting)   . Raynaud's disease without gangrene   . Shoulder pain left   h/o RTC tendonitis and adhesive capsulitis  . Sjogren's syndrome ([place])   . Sleep apnea    prior PCP - no CPAP for about 10 yrs  . 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[place])   . Vitamin D deficiency    prior PCP    Patient  Active Problem List   Diagnosis Date Noted  . MCI (mild cognitive impairment) with memory loss 10/22/2019  . Right sided abdominal pain 10/07/2019  . Skin lesion 09/14/2019  . Renal insufficiency 09/14/2019  . Pain in right buttock 06/18/2019  . Chronic midline thoracic back pain 05/14/2019  . Sjogren's syndrome without extraglandular involvement ([place]) 02/26/2019  . Epigastric discomfort 12/17/2018  . PAD (peripheral artery disease) ([place]) 10/04/2018  . Raynaud disease 09/04/2018  . Amaurosis fugax 06/18/2018  . TIA (transient ischemic attack) 05/21/2018  . 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 Facial paresthesia 02/07/2018  . Pain and swelling of lower leg 12/25/2017  . Fibromyalgia   . 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 Intractable episodic headache 03/13/2017  . Numbness and tingling of both lower extremities 03/13/2017  . 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 Fatty liver 02/13/2017  . LADA (latent autoimmune diabetes in adults), managed as type 1 ([place]) 10/03/2016  . Medicare annual wellness visit, subsequent 06/19/2015  . Health maintenance examination 06/19/2015  . Advanced care planning/counseling discussion 06/19/2015  . 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 Vitamin D deficiency   . Family history of premature CAD 06/02/2015  . Chest pain  05/26/2015  . Midline low back pain 09/25/2014  . 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 Hyperlipidemia associated with type 2 diabetes mellitus ([place]) 07/30/2014  . Essential hypertension 07/17/2014  . Hypothyroidism due to Hashimoto's thyroiditis 07/17/2014  . Adjustment disorder with mixed anxiety and depressed mood 07/17/2014  . Apnea, sleep 08/22/2012  . LBP (low back pain) 02/27/2012  . Positive ANA (antinuclear antibody) 01/06/2012    Past Surgical History:  Procedure Laterality Date  . ABDOMINAL HYSTERECTOMY  1978   fibroids and menorrhagia, ovaries  remain  . ARTERY BIOPSY Right 04/06/2018   Procedure: BIOPSY TEMPORAL ARTERY RIGHT;  Surgeon: Beverly Gust, MD;  Location: [facility];  Service: ENT;  Laterality: Right;  Diabetic - insulin pump sleep apnea  . [facility] normal per patient  . COLONOSCOPY  10/2011   1 TA, 1 HP, very tortuous colon ([place])  . COLONOSCOPY WITH ESOPHAGOGASTRODUODENOSCOPY (EGD)  03/2007   2 ulcers, benign polyp, rpt 5 yrs ([place], [place])  . JOINT REPLACEMENT Right    hip  . PARTIAL HIP ARTHROPLASTY  2013   Right hip replacement  . TONSILLECTOMY    . TONSILLECTOMY AND ADENOIDECTOMY    . TRANSCAROTID ARTERY REVASCULARIZATION Left 07/23/2018   Procedure: TRANSCAROTID ARTERY REVASCULARIZATION;  Surgeon: Marty Heck, MD;  Location: [facility];  Service: Vascular;  Laterality: Left;  . TUBAL LIGATION    . VAGINAL DELIVERY     x2, no complications    Prior to Admission medications   Medication Sig Start Date End Date Taking? Authorizing Provider  aspirin EC 81 MG tablet Take 81 mg by mouth daily.    [provider]  BD ULTRA-FINE LANCETS lancets Use as instructed upto 6 times daily 09/02/14   Phadke, Radhika P, MD  benzocaine (ORAJEL) 10 % mucosal gel Use as directed 1 application in the mouth or throat 4 (four) times daily as needed for mouth pain.    [provider]  clopidogrel (PLAVIX) 75 MG tablet Take 1 tablet (75 mg total) by mouth daily. 09/03/19   Marty Heck, MD  Continuous Blood Gluc Receiver (DEXCOM G6 RECEIVER) DEVI 1 kit by Does not apply route See admin instructions. Use to check blood sugar 4 times a day 09/18/19   Philemon Kingdom, MD  Continuous Blood Gluc Sensor (DEXCOM G6 SENSOR) MISC 1 each by Does not apply route See admin instructions. Change sensor every 10 days 09/18/19   Philemon Kingdom, MD  Continuous Blood Gluc Transmit (DEXCOM G6 TRANSMITTER) MISC 1 kit by Does not apply route See admin  instructions. Use to check blood sugar 09/18/19   Philemon Kingdom, MD  diazepam (VALIUM) 5 MG tablet Take 0.5-1 tablets (2.5-5 mg total) by mouth every 12 (twelve) hours as needed for anxiety (MRI - use with driver, sedation precautions). 10/25/19   [name], MD  diclofenac sodium (VOLTAREN) 1 % GEL Apply 1 application topically 3 (three) times daily. 01/05/18   [name], MD  glucagon (GLUCAGON EMERGENCY) 1 MG injection Inject 1 mg into the muscle once as needed for up to 1 dose. 11/06/18   Philemon Kingdom, MD  glucose blood (CONTOUR NEXT TEST) test strip Use as instructed to check 4 times daily 07/03/17   Philemon Kingdom, MD  insulin aspart (NOVOLOG) 100 UNIT/ML FlexPen Inject 6-10 Units into the skin 3 (three) times daily with meals. 07/15/19   Philemon Kingdom, MD  Insulin Pen Needle 32G X 4 MM MISC  Use 4-5x a day 11/06/18   Philemon Kingdom, MD  Javier Docker Oil 500 MG CAPS Take 2 capsules (1,000 mg total) by mouth daily. Patient taking differently: Take 500 mg by mouth daily.  09/20/17   [name], MD  lisinopril (ZESTRIL) 10 MG tablet TAKE 1 TABLET(10 MG) BY MOUTH DAILY 11/11/19   [name], MD  metoprolol succinate (TOPROL-XL) 25 MG 24 hr tablet TAKE 1 TABLET(25 MG) BY MOUTH DAILY 10/07/19   [name], MD  neomycin-polymyxin-dexamethasone (MAXITROL) 0.1 % ophthalmic suspension Place 1 drop into both eyes daily.    [provider]  nitroGLYCERIN (NITROSTAT) 0.4 MG SL tablet Place 1 tablet (0.4 mg total) under the tongue every 5 (five) minutes as needed for chest pain. 07/31/18   [name], MD  pantoprazole (PROTONIX) 40 MG tablet Take 1 tablet (40 mg total) by mouth daily as needed (GERD). 10/07/19   [name], MD  rosuvastatin (CRESTOR) 10 MG tablet Take 1 tablet (10 mg total) by mouth daily. 09/03/19   Marty Heck, MD  SYNTHROID 75 MCG tablet TAKE 1 TABLET BY MOUTH ONCE DAILY FOR 6 DAYS AND 1 AND 1/2 TABLETS ON THE 7TH DAY  09/13/19   [name], MD     Allergies Iodinated diagnostic agents, Penicillins, Amlodipine, Anectine [succinylcholine], Codeine, Gabapentin, Influenza vaccines, Nortriptyline, Pamelor [nortriptyline hcl], Valsartan, Erythromycin, and Sulfa antibiotics  Family History  Problem Relation Age of Onset  . CAD Mother 18       MI, aortic valve issues  . COPD Mother   . Lupus Mother   . Graves' disease Mother   . Rheum arthritis Mother   . CAD Father 80       CABG x2, aortic valve replacement  . Stroke Sister   . Alcohol abuse Brother   . CAD Brother 32       MI  . Stroke Brother   . Seizures Son   . COPD Brother   . CAD Brother 15       stent  . Diabetes Brother   . Depression Grandchild   . Cancer Maternal Aunt        breast  . Breast cancer Maternal Aunt   . Diabetes Sister        deceased  . CAD Sister 49       stents  . Breast cancer Maternal Aunt     Social History Social History   Tobacco Use  . Smoking status: Never Smoker  . Smokeless tobacco: Never Used  Substance Use Topics  . Alcohol use: No  . Drug use: No    Review of Systems  Constitutional: No fever/chills Eyes: No visual changes.  ENT: Dry mouth Cardiovascular: Denies chest pain. Respiratory: Denies shortness of breath.  Positive cough Gastrointestinal: No abdominal pain.  No nausea, no vomiting.   Genitourinary: Polyuria Musculoskeletal: Negative for back pain. Skin: Negative for rash. Neurological: Negative for headaches or weakness   ____________________________________________   PHYSICAL EXAM:  VITAL SIGNS: ED Triage Vitals  Enc Vitals Group     BP 11/25/19 1159 (!) 162/64     Pulse Rate 11/25/19 1159 (!) 114     Resp 11/25/19 1159 16     Temp 11/25/19 1159 99 F (37.2 C)     Temp Source 11/25/19 1159 Oral     SpO2 11/25/19 1159 94 %     Weight 11/25/19 1156 70.8 kg (156 lb 1.4 oz)     Height 11/25/19 1156 1.626 m ('5\' 4"' )  Head Circumference --      Peak Flow --       Pain Score 11/25/19 1155 7     Pain Loc --      Pain Edu? --      Excl. in [place]? --     Constitutional: Alert and oriented.  Eyes: Conjunctivae are normal.   Nose: No congestion/rhinnorhea. Mouth/Throat: Mucous membranes are moist.    Cardiovascular: Normal rate, regular rhythm. Grossly normal heart sounds.  Good peripheral circulation. Respiratory: Normal respiratory effort.  No retractions. Gastrointestinal: Soft and nontender. No distention.    Musculoskeletal: No lower extremity tenderness nor edema.  Warm and well perfused Neurologic:  Normal speech and language. No gross focal neurologic deficits are appreciated.  Skin:  Skin is warm, dry and intact. No rash noted. Psychiatric: Mood and affect are normal. Speech and behavior are normal.  ____________________________________________   LABS (all labs ordered are listed, but only abnormal results are displayed)  Labs Reviewed  BASIC METABOLIC PANEL - Abnormal; Notable for the following components:      Result Value   Sodium 133 (*)    CO2 20 (*)    Glucose, Bld 542 (*)    GFR calc non Af Amer 56 (*)    All other components within normal limits  URINALYSIS, COMPLETE (UACMP) WITH MICROSCOPIC - Abnormal; Notable for the following components:   Color, Urine COLORLESS (*)    APPearance CLEAR (*)    Glucose, UA >=500 (*)    All other components within normal limits  GLUCOSE, CAPILLARY - Abnormal; Notable for the following components:   Glucose-Capillary 504 (*)    All other components within normal limits  GLUCOSE, CAPILLARY - Abnormal; Notable for the following components:   Glucose-Capillary 426 (*)    All other components within normal limits  GLUCOSE, CAPILLARY - Abnormal; Notable for the following components:   Glucose-Capillary 223 (*)    All other components within normal limits  CBC  CBG MONITORING, ED  POC SARS CORONAVIRUS 2 AG -  ED  POC SARS CORONAVIRUS 2 AG    ____________________________________________  EKG  ED ECG REPORT I, Lavonia Drafts, the attending physician, personally viewed and interpreted this ECG.  Date: 11/25/2019  Rhythm: normal sinus rhythm QRS Axis: normal Intervals: normal ST/T Wave abnormalities: normal Narrative Interpretation: no evidence of acute ischemia  ____________________________________________  RADIOLOGY  Chest x-ray unremarkable ____________________________________________   PROCEDURES  Procedure(s) performed: No  Procedures   Critical Care performed: No ____________________________________________   INITIAL IMPRESSION / ASSESSMENT AND PLAN / ED COURSE  Pertinent labs & imaging results that were available during my care of the patient were reviewed by me and considered in my medical decision making (see chart for details).  Patient presents with hyperglycemia exam is overall reassuring.  We will give IV insulin 10 units after having the patient suspend her insulin pump, IV fluids given her initial glucose is over 500 and closely monitor.  No evidence of DKA lab work is otherwise reassuring.  Patient had significant improvement in glucose after treatment, glucose decreased to 223, patient is comfortable managing and can follow-up with her endocrinologist, appropriate for discharge at this time    ____________________________________________   FINAL CLINICAL IMPRESSION(S) / ED DIAGNOSES  Final diagnoses:  Hyperglycemia        Note:  This document was prepared using Dragon voice recognition software and may include unintentional dictation errors.   Lavonia Drafts, MD 11/25/19 1620

## 2019-11-26 ENCOUNTER — Ambulatory Visit: Payer: PPO | Admitting: Internal Medicine

## 2019-11-26 NOTE — Telephone Encounter (Signed)
Patient reports having gone to the ER yesterday and the doctor diagnosed her as dehydrated.  They gave her fluids with Novolog and blood sugar came down.  Says she does not have pneumonia.  Is seeing her family doctor tomorrow.  No more SOB.  Says blood sugar was 205 this AM.

## 2019-11-26 NOTE — Telephone Encounter (Signed)
Noted. Thank you! We will need an appt soon.

## 2019-11-26 NOTE — Telephone Encounter (Signed)
Linda,  I did send in the refill we talked about yesterday.

## 2019-11-26 NOTE — Telephone Encounter (Signed)
If she is comfortable with it, we can keep it.

## 2019-11-27 ENCOUNTER — Ambulatory Visit (INDEPENDENT_AMBULATORY_CARE_PROVIDER_SITE_OTHER): Payer: PPO | Admitting: Family Medicine

## 2019-11-27 ENCOUNTER — Encounter: Payer: Self-pay | Admitting: Family Medicine

## 2019-11-27 ENCOUNTER — Other Ambulatory Visit: Payer: Self-pay

## 2019-11-27 VITALS — BP 130/68 | HR 108 | Temp 97.9°F | Ht 63.5 in | Wt 154.0 lb

## 2019-11-27 DIAGNOSIS — E139 Other specified diabetes mellitus without complications: Secondary | ICD-10-CM | POA: Diagnosis not present

## 2019-11-27 DIAGNOSIS — E038 Other specified hypothyroidism: Secondary | ICD-10-CM

## 2019-11-27 DIAGNOSIS — R5383 Other fatigue: Secondary | ICD-10-CM | POA: Diagnosis not present

## 2019-11-27 DIAGNOSIS — M797 Fibromyalgia: Secondary | ICD-10-CM

## 2019-11-27 DIAGNOSIS — E063 Autoimmune thyroiditis: Secondary | ICD-10-CM

## 2019-11-27 DIAGNOSIS — G3184 Mild cognitive impairment, so stated: Secondary | ICD-10-CM | POA: Diagnosis not present

## 2019-11-27 LAB — HIGH SENSITIVITY CRP: CRP, High Sensitivity: 68.6 mg/L — ABNORMAL HIGH (ref 0.000–5.000)

## 2019-11-27 LAB — T4, FREE: Free T4: 1.05 ng/dL (ref 0.60–1.60)

## 2019-11-27 LAB — SEDIMENTATION RATE: Sed Rate: 25 mm/hr (ref 0–30)

## 2019-11-27 LAB — TSH: TSH: 1.73 u[IU]/mL (ref 0.35–4.50)

## 2019-11-27 MED ORDER — MEMANTINE HCL 5 MG PO TABS: ORAL_TABLET | ORAL | 1 refills | Status: DC

## 2019-11-27 NOTE — Patient Instructions (Addendum)
Labs today Trial namenda 5mg  daily for 1 week then increase to 5mg  twice daily. We will trial this for memory for a month.  Call and schedule rheumatology appointment for follow up.  Return in 4-6 weeks for follow up visit.

## 2019-11-27 NOTE — Assessment & Plan Note (Signed)
She remains concerned. Avoid aricept in GERD history (on PPI). Will trial namenda x1 month.

## 2019-11-27 NOTE — Assessment & Plan Note (Signed)
Recent hyperglycemia s/p stable ER eval. Has endo f/u scheduled for next month.

## 2019-11-27 NOTE — Progress Notes (Signed)
This visit was conducted in person.  BP 130/68 (BP Location: Left Arm, Patient Position: Sitting, Cuff Size: Normal)   Pulse (!) 108   Temp 97.9 F (36.6 C) (Temporal)   Ht 5' 3.5" (1.613 m)   Wt 154 lb (69.9 kg)   SpO2 97%   BMI 26.85 kg/m    CC: 6 wk f/u visit  Subjective:    Patient ID: Cassandra Benson, female    DOB: [birth year], 79 y.o.   MRN: [id number]  HPI: Cassandra Benson is a 79 y.o. female presenting on 11/27/2019 for Follow-up (Here for 4-6 wk MCI and memory f/u.)   See prior note for details. Undergoing workup for noted memory trouble - MMSE last visit was 28/30. Diagnosed with MCI. MRI and dementia panel were reassuring.  She is on pantoprazole for h/o GERD.  Notes intermittent nausea.   Seen at ER on Monday with hyperglycemia to >500 associated with cough and shortness of breath. Covid test negative. Treated with IV insulin 10u and IVF. Told she was dehydrated. EKG was stable (reviewed ER EKG compared to prior). She states she continues drinking 1/2 gallon of water daily.  Ongoing marked fatigue. Denies depression.  Upcoming endo appt next month.   Has been dealing with head cold for 2.5 wks. Overall improved but persistent raspy voice. Cough has improved.   Continues struggling with mask usage due to prior traumatic experience - feels it suffocates her.   Does not want covid vaccine. Worried given prior bad reactions to vaccines.      Relevant past medical, surgical, family and social history reviewed and updated as indicated. Interim medical history since our last visit reviewed. Allergies and medications reviewed and updated. Outpatient Medications Prior to Visit  Medication Sig Dispense Refill  . aspirin EC 81 MG tablet Take 81 mg by mouth daily.    . BD ULTRA-FINE LANCETS lancets Use as instructed upto 6 times daily 600 each 2  . benzocaine (ORAJEL) 10 % mucosal gel Use as directed 1 application in the mouth or throat 4 (four) times daily as needed for  mouth pain.    Marland Kitchen clopidogrel (PLAVIX) 75 MG tablet Take 1 tablet (75 mg total) by mouth daily. 30 tablet 6  . Continuous Blood Gluc Receiver (DEXCOM G6 RECEIVER) DEVI 1 kit by Does not apply route See admin instructions. Use to check blood sugar 4 times a day 1 each 0  . Continuous Blood Gluc Sensor (DEXCOM G6 SENSOR) MISC 1 each by Does not apply route See admin instructions. Change sensor every 10 days 3 each 3  . Continuous Blood Gluc Transmit (DEXCOM G6 TRANSMITTER) MISC 1 kit by Does not apply route See admin instructions. Use to check blood sugar 1 each 1  . diazepam (VALIUM) 5 MG tablet Take 0.5-1 tablets (2.5-5 mg total) by mouth every 12 (twelve) hours as needed for anxiety (MRI - use with driver, sedation precautions). 2 tablet 0  . diclofenac sodium (VOLTAREN) 1 % GEL Apply 1 application topically 3 (three) times daily. 1 Tube 1  . glucagon (GLUCAGON EMERGENCY) 1 MG injection Inject 1 mg into the muscle once as needed for up to 1 dose. 1 each 12  . glucose blood (CONTOUR NEXT TEST) test strip Use as instructed to check 4 times daily 400 each 5  . insulin aspart (NOVOLOG) 100 UNIT/ML FlexPen Inject 6-10 Units into the skin 3 (three) times daily with meals. 15 mL 11  . Insulin Pen Needle 32G X  4 MM MISC Use 4-5x a day 300 each 3  . Krill Oil 500 MG CAPS Take 2 capsules (1,000 mg total) by mouth daily. (Patient taking differently: Take 500 mg by mouth daily. )    . lisinopril (ZESTRIL) 10 MG tablet TAKE 1 TABLET(10 MG) BY MOUTH DAILY 90 tablet 0  . metoprolol succinate (TOPROL-XL) 25 MG 24 hr tablet TAKE 1 TABLET(25 MG) BY MOUTH DAILY 30 tablet 11  . neomycin-polymyxin-dexamethasone (MAXITROL) 0.1 % ophthalmic suspension Place 1 drop into both eyes daily.    . nitroGLYCERIN (NITROSTAT) 0.4 MG SL tablet Place 1 tablet (0.4 mg total) under the tongue every 5 (five) minutes as needed for chest pain. 30 tablet 1  . pantoprazole (PROTONIX) 40 MG tablet Take 1 tablet (40 mg total) by mouth daily as  needed (GERD).    Marland Kitchen rosuvastatin (CRESTOR) 10 MG tablet Take 1 tablet (10 mg total) by mouth daily. 30 tablet 5  . SYNTHROID 75 MCG tablet TAKE 1 TABLET BY MOUTH ONCE DAILY FOR 6 DAYS AND 1 AND 1/2 TABLETS ON THE 7TH DAY 105 tablet 3   No facility-administered medications prior to visit.     Per HPI unless specifically indicated in ROS section below Review of Systems Objective:    BP 130/68 (BP Location: Left Arm, Patient Position: Sitting, Cuff Size: Normal)   Pulse (!) 108   Temp 97.9 F (36.6 C) (Temporal)   Ht 5' 3.5" (1.613 m)   Wt 154 lb (69.9 kg)   SpO2 97%   BMI 26.85 kg/m   Wt Readings from Last 3 Encounters:  11/27/19 154 lb (69.9 kg)  11/25/19 156 lb 1.4 oz (70.8 kg)  10/22/19 156 lb 2 oz (70.8 kg)    Physical Exam Vitals and nursing note reviewed.  Constitutional:      Appearance: Normal appearance. She is not ill-appearing.  Eyes:     Extraocular Movements: Extraocular movements intact.     Pupils: Pupils are equal, round, and reactive to light.  Neck:     Thyroid: No thyromegaly or thyroid tenderness.  Cardiovascular:     Rate and Rhythm: Normal rate and regular rhythm.     Pulses: Normal pulses.     Heart sounds: Normal heart sounds. No murmur.  Pulmonary:     Effort: Pulmonary effort is normal. No respiratory distress.     Breath sounds: Normal breath sounds. No wheezing, rhonchi or rales.  Musculoskeletal:     Right lower leg: No edema.     Left lower leg: No edema.  Neurological:     Mental Status: She is alert.  Psychiatric:        Mood and Affect: Mood normal.        Behavior: Behavior normal.       Results for orders placed or performed during the hospital encounter of 16/10/96  Basic metabolic panel  Result Value Ref Range   Sodium 133 (L) 135 - 145 mmol/L   Potassium 4.2 3.5 - 5.1 mmol/L   Chloride 102 98 - 111 mmol/L   CO2 20 (L) 22 - 32 mmol/L   Glucose, Bld 542 (HH) 70 - 99 mg/dL   BUN 14 8 - 23 mg/dL   Creatinine, Ser 0.98 0.44 -  1.00 mg/dL   Calcium 9.0 8.9 - 10.3 mg/dL   GFR calc non Af Amer 56 (L) >60 mL/min   GFR calc Af Amer >60 >60 mL/min   Anion gap 11 5 - 15  CBC  Result  Value Ref Range   WBC 10.5 4.0 - 10.5 K/uL   RBC 5.02 3.87 - 5.11 MIL/uL   Hemoglobin 13.5 12.0 - 15.0 g/dL   HCT 41.0 36.0 - 46.0 %   MCV 81.7 80.0 - 100.0 fL   MCH 26.9 26.0 - 34.0 pg   MCHC 32.9 30.0 - 36.0 g/dL   RDW 13.8 11.5 - 15.5 %   Platelets 210 150 - 400 K/uL   nRBC 0.0 0.0 - 0.2 %  Urinalysis, Complete w Microscopic  Result Value Ref Range   Color, Urine COLORLESS (A) YELLOW   APPearance CLEAR (A) CLEAR   Specific Gravity, Urine 1.008 1.005 - 1.030   pH 6.0 5.0 - 8.0   Glucose, UA >=500 (A) NEGATIVE mg/dL   Hgb urine dipstick NEGATIVE NEGATIVE   Bilirubin Urine NEGATIVE NEGATIVE   Ketones, ur NEGATIVE NEGATIVE mg/dL   Protein, ur NEGATIVE NEGATIVE mg/dL   Nitrite NEGATIVE NEGATIVE   Leukocytes,Ua NEGATIVE NEGATIVE   RBC / HPF 0-5 0 - 5 RBC/hpf   WBC, UA 0-5 0 - 5 WBC/hpf   Bacteria, UA NONE SEEN NONE SEEN   Squamous Epithelial / LPF 0-5 0 - 5  Glucose, capillary  Result Value Ref Range   Glucose-Capillary 504 (HH) 70 - 99 mg/dL   Comment 1 Notify RN    Comment 2 Document in Chart    Comment 3 Repeat Test   Glucose, capillary  Result Value Ref Range   Glucose-Capillary 426 (H) 70 - 99 mg/dL  Glucose, capillary  Result Value Ref Range   Glucose-Capillary 223 (H) 70 - 99 mg/dL  POC SARS Coronavirus 2 Ag  Result Value Ref Range   SARS Coronavirus 2 Ag NEGATIVE NEGATIVE   Assessment & Plan:  This visit occurred during the SARS-CoV-2 public health emergency.  Safety protocols were in place, including screening questions prior to the visit, additional usage of staff PPE, and extensive cleaning of exam room while observing appropriate contact time as indicated for disinfecting solutions.   Problem List Items Addressed This Visit    MCI (mild cognitive impairment) with memory loss - Primary    She remains  concerned. Avoid aricept in GERD history (on PPI). Will trial namenda x1 month.       LADA (latent autoimmune diabetes in adults), managed as type 1 ([place])    Recent hyperglycemia s/p stable ER eval. Has endo f/u scheduled for next month.       Hypothyroidism due to Hashimoto's thyroiditis (Chronic)    Update TSH, fT4 with recently worsening fatigue.       Relevant Orders   TSH   T4, free   Fibromyalgia    Other Visit Diagnoses    Fatigue, unspecified type       Relevant Orders   TSH   Sedimentation rate   High sensitivity CRP   ANA   T4, free       Meds ordered this encounter  Medications  . memantine (NAMENDA) 5 MG tablet    Sig: Take 1 tablet (5 mg total) by mouth daily for 7 days, THEN 1 tablet (5 mg total) 2 (two) times daily.    Dispense:  60 tablet    Refill:  1   Orders Placed This Encounter  Procedures  . TSH  . Sedimentation rate  . High sensitivity CRP  . ANA  . T4, free    Patient Instructions  Labs today Trial namenda 37m daily for 1 week then increase to 512m  twice daily. We will trial this for memory for a month.  Call and schedule rheumatology appointment for follow up.  Return in 4-6 weeks for follow up visit.    Follow up plan: Return in about 4 weeks (around 12/25/2019) for follow up visit.  [name], MD

## 2019-11-27 NOTE — Assessment & Plan Note (Signed)
Update TSH, fT4 with recently worsening fatigue.

## 2019-11-28 ENCOUNTER — Encounter: Payer: Self-pay | Admitting: Family Medicine

## 2019-11-28 DIAGNOSIS — E109 Type 1 diabetes mellitus without complications: Secondary | ICD-10-CM | POA: Diagnosis not present

## 2019-11-28 LAB — ANA: Anti Nuclear Antibody (ANA): NEGATIVE

## 2019-11-28 NOTE — Telephone Encounter (Signed)
Seen yesterday

## 2019-12-09 ENCOUNTER — Encounter: Payer: Self-pay | Admitting: Internal Medicine

## 2019-12-09 NOTE — Telephone Encounter (Signed)
-----   Message from Philemon Kingdom, MD sent at 12/09/2019  1:15 PM EDT ----- [place], see msg.

## 2019-12-09 NOTE — Telephone Encounter (Signed)
Pt has been made aware to disregard. She was lighthearted and understood to look over the message. Pt is happy to see you tomorrow.

## 2019-12-10 ENCOUNTER — Ambulatory Visit: Payer: PPO | Admitting: Internal Medicine

## 2019-12-10 ENCOUNTER — Encounter: Payer: Self-pay | Admitting: Family Medicine

## 2019-12-10 ENCOUNTER — Other Ambulatory Visit: Payer: Self-pay

## 2019-12-10 ENCOUNTER — Encounter: Payer: Self-pay | Admitting: Internal Medicine

## 2019-12-10 VITALS — BP 160/60 | HR 110 | Ht 63.5 in | Wt 156.0 lb

## 2019-12-10 DIAGNOSIS — E038 Other specified hypothyroidism: Secondary | ICD-10-CM

## 2019-12-10 DIAGNOSIS — E063 Autoimmune thyroiditis: Secondary | ICD-10-CM | POA: Diagnosis not present

## 2019-12-10 DIAGNOSIS — E139 Other specified diabetes mellitus without complications: Secondary | ICD-10-CM | POA: Diagnosis not present

## 2019-12-10 MED ORDER — INSULIN ASPART 100 UNIT/ML ~~LOC~~ SOLN: SUBCUTANEOUS | 3 refills | Status: DC

## 2019-12-10 NOTE — Progress Notes (Signed)
Patient ID: Cassandra Benson, female   DOB: [birth year], 79 y.o.   MRN: [id number]   This visit occurred during the SARS-CoV-2 public health emergency.  Safety protocols were in place, including screening questions prior to the visit, additional usage of staff PPE, and extensive cleaning of exam room while observing appropriate contact time as indicated for disinfecting solutions.   HPI: Cassandra Benson is a 79 y.o.-year-old female, returning for f/u for LADA, initially dx'ed in 1998 (79 y/o), started insulin at dx, started insulin pump in ~2008, uncontrolled, without complications and hypothyroidism. She previously saw endocrinology at [place] (Dr. Janese Banks) and Dr Howell Rucks. Last visit with me 1.5 months ago.  Since last visit, she was diagnosed with mild cognitive impairment and started on Namenda.  She continues to have a significant amount of pain all over her body due to her rheumatologic autoimmune diseases.  Reviewed HbA1c levels: Lab Results  Component Value Date   HGBA1C 9.8 (A) 10/08/2019   HGBA1C 9.7 (H) 09/10/2019   HGBA1C 9.8 (A) 06/27/2019   She was previously on an insulin pump: - Medtronic 723-started 09/2016 (changed 07/2017), without CGM.  She uses NovoLog in the pump.  She was using Medtronic for supplies before, but she is now forced by insurance to use [employer].  She came off the pump as she had a lot of problems getting supplies from them. - basal rates: 12 am: 0.800 units/h 3 am: 0.875 9:30 am: 0.100 >> 0.500 5 pm: 1.500 6:30 pm: 1.300 11:30 pm: 1.800  - ICR:   12 am: 10 (8 if on prednisone) 6 pm: 8 (6 if on Prednisone) - target: 130-130 except 6-8 am and 6 pm-12 am: 100-100 - ISF: 35 - Insulin on Board: 4h - bolus wizard: On Total daily dose: 30 units per day (up to 40 units for prescription) - extended bolusing: Not using - changes infusion site: 5 to 6 days - Meter: Bayer Contour   At last visit, she was on: - Tresiba 18 >> 20-22 units in am - Novolog: ICR: 1:8  (starches)-1:10 (other foods), but mostly using carb equivalents Target: 130 Insulin sensitivity factor: 20 If you correct sugars at bedtime, only correct >200 and only give 2 to 3 units. She was previously using Glucophage ER d.a.w. but had to stop because this was not covered by her insurance.  She tried the generic metformin ER and this caused diarrhea so she had to stop.  She is now back on an insulin pump: T:slim x2 + Dexcom G6 CGM. However, this is now off since yesterday as the pump gave her an error when she tried to replace the cartridge. She is on basal-bolus insulin regimen now. Pump settings: - basal rates: 12 am: 0.800 units/h 3 am: 0.875 9:30 am: 0.500 5 pm: 1.300 6:30 pm: 1.200 11:30 pm: 1.500 - ICR: 1:8 - target: 130 - ISF:  12 am: 1:20 8 pm: 1:30 - Active insulin time: 5h - bolus wizard: on TDD from basal insulin: 51% (18 units) TDD from bolus insulin: 49% (17 units) TDD: 35-50 units  - extended bolusing: not using - changes infusion site: q3 days - Meter: Contour  Pt checks her sugars 4 times a day with her CGM  but also with her meter:  Previously: - am:  113-168, 181 >> 131-203 >> 158-269 >> 175-300 - 2h after b'fast:  107-148 >> n/c >> n/c - before lunch: 79, 120-200, 229 >> 177-234 >> 200's, 398 - 2h after lunch:  120,  141 >> n/c >> 330 >> n/c - before dinner: 92-219 >> 119, 132 >> 160-200s - 2h after dinner: 118-207, 247 >> 92-236 >> n/c - bedtime: n/c >> 171 >> n/c - nighttime:   92-141, 183 >> n/c >> 69 x 1 >> n/c  Lowest sugar was 43 >> ... 134 >> 61; hypoglycemia awareness in the 80s.  No history of hypoglycemia admissions.  She does have a glucagon kit at home. Highest sugar was 398 >> 500s (dehydration, afterwards: 339); no history of DKA admissions.  Pt's meals are: - Breakfast: protein drink + almond milk - Lunch: PB jelly sandwich or sandwich with ham or cream cheese sandwich - Dinner: salads  - Snacks: pretzels; pork tenderloin +  veggies; chicken + tenderloin No sodas.  She is walking 3 times a week, for 30 minutes.  No CKD, last BUN/creatinine:  Lab Results  Component Value Date   BUN 14 11/25/2019   BUN 15 10/07/2019   CREATININE 0.98 11/25/2019   CREATININE 0.96 10/07/2019  On lisinopril. + HL;  last set of lipids: Lab Results  Component Value Date   CHOL 212 (H) 09/10/2019   HDL 40.40 09/10/2019   LDLCALC 141 (H) 09/10/2019   LDLDIRECT 177.0 08/09/2016   TRIG 153.0 (H) 09/10/2019   CHOLHDL 5 09/10/2019  On Crestor 10 - continues to have muscle cramps despite using CoQ10.  Also krill oil.  Praluent was not covered. - last eye exam was in 05/2019: No DR -no numbness and tingling in her feet.  She was admitted for CP + SOB 10/01/2017.  Cardiac events and PE were ruled out.  She is seeing cardiology (Dr. Rockey Situ).  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  She had right carotid ultrasound in 2020 and there is a 39% stenosis, significantly increased since 2018.  She has a history of left carotid endarterectomy.  Hypothyroidism: -Due to Hashimoto's thyroiditis -Positive family history of Graves' disease in her mother -She was initially on Levoxyl, but she thought Levoxyl increased her lipid levels and caused hair loss, so we changed to Synthroid.  She feels much better on the brand name.  She is currently on Synthroid d.a.w. 75 mcg 6 out of 7 days: - in am - fasting - at least 30 min from b'fast - no Ca, Fe, PPIs, + MVIs at night - not on Biotin  Reviewed her TFTs: Lab Results  Component Value Date   TSH 1.73 11/27/2019   TSH 0.12 (L) 10/07/2019   TSH 0.22 (L) 09/10/2019   TSH 1.10 11/06/2018   TSH 0.09 (L) 08/29/2018   TSH 0.92 11/27/2017   TSH 0.30 (L) 10/11/2017   TSH 0.24 (L) 08/11/2017   TSH 0.58 01/04/2017   TSH 0.42 08/09/2016   She also has history of SLE and Sjogren's syndrome.  She has generalized muscle and  joint aches from these 2 conditions.  We checked her hormones in the setting of hirsutism and acne and they were normal Component     Latest Ref Rng & Units 09/02/2014 09/08/2014  Testosterone     3 - 41 ng/dL 59 (H)   Testosterone Free     0.0 - 4.2 pg/mL 2.1   FSH     mIU/mL  68.6  LH     mIU/mL  43.4  DHEA-SO4     7 - 177 ug/dL 49   Cortisol, Plasma     ug/dL  1.3 (Normal  Dexamethasone suppression test)   Estradiol     pg/mL  24.0   Also, she had no signs of insufficiency: Component     Latest Ref Rng & Units 01/04/2017  Cortisol - AM     mcg/dL 17.6  C206 ACTH     6 - 50 pg/mL 15   ROS: Constitutional: no weight gain/no weight loss, + fatigue, no subjective hyperthermia, no subjective hypothermia Eyes: no blurry vision, no xerophthalmia ENT: no sore throat, no nodules palpated in neck, no dysphagia, no odynophagia, no hoarseness Cardiovascular: no CP/no SOB/no palpitations/no leg swelling Respiratory: no cough/no SOB/no wheezing Gastrointestinal: no N/no V/no D/no C/no acid reflux Musculoskeletal: + muscle aches/+ joint aches Skin: no rashes, no hair loss Neurological: no tremors/no numbness/no tingling/no dizziness  I reviewed pt's medications, allergies, PMH, social hx, family hx, and changes were documented in the history of present illness. Otherwise, unchanged from my initial visit note.  Past Medical History:  Diagnosis Date  . Allergy   . ANA positive    positive ANA pattern 1 speckled  . Arthritis   . Carotid stenosis, asymptomatic 06/19/2015   5-94% RICA 58-59% LICA rpt 1 yr (10/9242)   . Dermatomyositis ([place])   . Diabetes mellitus without complication (HCC)    Type 1  . Family history of adverse reaction to anesthesia    brothr went into cardiac arrest from anectine  . Fibromyalgia    prior PCP  . GERD (gastroesophageal reflux disease)    prior PCP  . Glaucoma    [place]  . History of blood clots    DVT, in 20s, none since  . History of  chicken pox   . History of diverticulitis   . History of pericarditis 1986   with hospitalization  . History of pneumonia 2014  . History of shingles   . History of UTI   . Hyperlipidemia   . Hypertension   . Hypothyroidism   . Mixed connective tissue disease ([place])   . PONV (postoperative nausea and vomiting)   . Raynaud's disease without gangrene   . Shoulder pain left   h/o RTC tendonitis and adhesive capsulitis  . Sjogren's syndrome ([place])   . Sleep apnea    prior PCP - no CPAP for about 10 yrs  . 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[place])   . Vitamin D deficiency    prior PCP   Past Surgical History:  Procedure Laterality Date  . ABDOMINAL HYSTERECTOMY  1978   fibroids and menorrhagia, ovaries remain  . ARTERY BIOPSY Right 04/06/2018   Procedure: BIOPSY TEMPORAL ARTERY RIGHT;  Surgeon: Beverly Gust, MD;  Location: [facility];  Service: ENT;  Laterality: Right;  Diabetic - insulin pump sleep apnea  . [facility] normal per patient  . COLONOSCOPY  10/2011   1 TA, 1 HP, very tortuous colon ([place])  . COLONOSCOPY WITH ESOPHAGOGASTRODUODENOSCOPY (EGD)  03/2007   2 ulcers, benign polyp, rpt 5 yrs ([place]  . JOINT REPLACEMENT Right    hip  . PARTIAL HIP ARTHROPLASTY  2013   Right hip replacement  . TONSILLECTOMY    . TONSILLECTOMY AND ADENOIDECTOMY    . TRANSCAROTID ARTERY REVASCULARIZATION Left 07/23/2018   Procedure: TRANSCAROTID ARTERY REVASCULARIZATION;  Surgeon: Marty Heck, MD;  Location: [facility];  Service: Vascular;  Laterality: Left;  . TUBAL LIGATION    . VAGINAL DELIVERY     x2, no complications   Social History   Social  History  . Widowed   . Number of children: 2   Occupational History  Retired    Social History Main Topics  . Smoking status: Never Smoker  . Smokeless tobacco: Never Used  . Alcohol use No  . Drug use: No   Social History Narrative   Lives in [place] now. Recently moved from  [place].   No pets.   Mother of Jorita Bohanon.   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 aspirin EC 81 MG tablet Take 81 mg by mouth daily.    . BD ULTRA-FINE LANCETS lancets Use as instructed upto 6 times daily 600 each 2  . benzocaine (ORAJEL) 10 % mucosal gel Use as directed 1 application in the mouth or throat 4 (four) times daily as needed for mouth pain.    Marland Kitchen clopidogrel (PLAVIX) 75 MG tablet Take 1 tablet (75 mg total) by mouth daily. 30 tablet 6  . Continuous Blood Gluc Receiver (DEXCOM G6 RECEIVER) DEVI 1 kit by Does not apply route See admin instructions. Use to check blood sugar 4 times a day 1 each 0  . Continuous Blood Gluc Sensor (DEXCOM G6 SENSOR) MISC 1 each by Does not apply route See admin instructions. Change sensor every 10 days 3 each 3  . Continuous Blood Gluc Transmit (DEXCOM G6 TRANSMITTER) MISC 1 kit by Does not apply route See admin instructions. Use to check blood sugar 1 each 1  . diazepam (VALIUM) 5 MG tablet Take 0.5-1 tablets (2.5-5 mg total) by mouth every 12 (twelve) hours as needed for anxiety (MRI - use with driver, sedation precautions). 2 tablet 0  . diclofenac sodium (VOLTAREN) 1 % GEL Apply 1 application topically 3 (three) times daily. 1 Tube 1  . glucagon (GLUCAGON EMERGENCY) 1 MG injection Inject 1 mg into the muscle once as needed for up to 1 dose. 1 each 12  . glucose blood (CONTOUR NEXT TEST) test strip Use as instructed to check 4 times daily 400 each 5  . insulin aspart (NOVOLOG) 100 UNIT/ML FlexPen Inject 6-10 Units into the skin 3 (three) times daily with meals. 15 mL 11  . Insulin Pen Needle 32G X 4 MM MISC Use 4-5x a day 300 each 3  . Krill Oil 500 MG  CAPS Take 2 capsules (1,000 mg total) by mouth daily. (Patient taking differently: Take 500 mg by mouth daily. )    . lisinopril (ZESTRIL) 10 MG tablet TAKE 1 TABLET(10 MG) BY MOUTH DAILY 90 tablet 0  . memantine (NAMENDA) 5 MG tablet Take 1 tablet (5 mg total) by mouth daily for 7 days, THEN 1 tablet (5 mg total) 2 (two) times daily. 60 tablet 1  . metoprolol succinate (TOPROL-XL) 25 MG 24 hr tablet TAKE 1 TABLET(25 MG) BY MOUTH DAILY 30 tablet 11  . neomycin-polymyxin-dexamethasone (MAXITROL) 0.1 % ophthalmic suspension Place 1 drop into both eyes daily.    . nitroGLYCERIN (NITROSTAT) 0.4 MG SL tablet Place 1 tablet (0.4 mg total) under the tongue every 5 (five) minutes as needed for chest pain. 30 tablet 1  . pantoprazole (PROTONIX) 40 MG tablet Take 1 tablet (40 mg total) by mouth daily as needed (GERD).    Marland Kitchen  rosuvastatin (CRESTOR) 10 MG tablet Take 1 tablet (10 mg total) by mouth daily. 30 tablet 5  . SYNTHROID 75 MCG tablet TAKE 1 TABLET BY MOUTH ONCE DAILY FOR 6 DAYS AND 1 AND 1/2 TABLETS ON THE 7TH DAY 105 tablet 3   No current facility-administered medications on file prior to visit.   Allergies  Allergen Reactions  . Iodinated Diagnostic Agents Other (See Comments)    Itching (severe) and chest tightness  . Penicillins Anaphylaxis, Swelling, Rash and Other (See Comments)    Has patient had a PCN reaction causing immediate rash, facial/tongue/throat swelling, SOB or lightheadedness with hypotension: Yes Has patient had a PCN reaction causing severe rash involving mucus membranes or skin necrosis: Unknown Has patient had a PCN reaction that required hospitalization: Unknown Has patient had a PCN reaction occurring within the last 10 years: No If all of the above answers are "NO", then may proceed with Cephalosporin use.   . Amlodipine Swelling    Pedal edema  . Anectine [Succinylcholine] Other (See Comments)    Brother went into cardiac arrest.  . Codeine Nausea Only  . Gabapentin  Other (See Comments)    Gait abnormality  . Influenza Vaccines Other (See Comments)    Muscle weakness; unable to walk  . Nortriptyline Other (See Comments)    Eye swelling and mouth drawed up  . Pamelor [Nortriptyline Hcl] Other (See Comments)    Patient states caused her face to draw in together.  . Valsartan Other (See Comments) and Cough    Allergy to generic only, "Hacking" cough  . Erythromycin Rash and Swelling  . Sulfa Antibiotics Rash   Family History  Problem Relation Age of Onset  . CAD Mother 32       MI, aortic valve issues  . COPD Mother   . Lupus Mother   . Graves' disease Mother   . Rheum arthritis Mother   . CAD Father 47       CABG x2, aortic valve replacement  . Stroke Sister   . Alcohol abuse Brother   . CAD Brother 9       MI  . Stroke Brother   . Seizures Son   . COPD Brother   . CAD Brother 92       stent  . Diabetes Brother   . Depression Grandchild   . Cancer Maternal Aunt        breast  . Breast cancer Maternal Aunt   . Diabetes Sister        deceased  . CAD Sister 79       stents  . Breast cancer Maternal Aunt    PE: BP (!) 160/60   Pulse (!) 110   Ht 5' 3.5" (1.613 m)   Wt 156 lb (70.8 kg)   SpO2 97%   BMI 27.20 kg/m  Wt Readings from Last 3 Encounters:  12/10/19 156 lb (70.8 kg)  11/27/19 154 lb (69.9 kg)  11/25/19 156 lb 1.4 oz (70.8 kg)   Constitutional: normal weight, in NAD Eyes: PERRLA, EOMI, no exophthalmos ENT: moist mucous membranes, no thyromegaly, no cervical lymphadenopathy Cardiovascular: Tachycardia RR, No MRG Respiratory: CTA B. Patient was short of breath at the beginning of the appointment, but is normalized towards the end of the appointment. Gastrointestinal: abdomen soft, NT, ND, BS+ Musculoskeletal: no deformities, strength intact in all 4 Skin: moist, warm, no rashes Neurological: no tremor with outstretched hands, DTR normal in all 4  ASSESSMENT: 1. DM1, uncontrolled,  without long-term  complications, but with hyperglycemia  2.  Hashimoto's hypothyroidism  3. HL  PLAN:  1. Patient with longstanding, uncontrolled, LADA, previously on insulin pump and then on basal-bolus insulin regimen due to problems with insurance, and now back on insulin pump since last visit. She is on the t:slim X2 insulin pump with Dexcom CGM. Today she is off the pump since it gave her an error last night when she tried to put the new insulin cartridge. I made a referral to diabetes education at this visit to help with this problem. -Overall, however, her sugars aren't the best I have seen for her in the long. She does have some higher blood sugars in the 200 but overall they are not as fluctuating as in the past and she appears to be doing a good job checking her sugars before meals, entering them in the pump and starting boluses with every meal. I congratulated her for this and strongly advised her to continue. -She does have problems with memory and was recently started on Namenda, however, based on the review of her pump, she is doing a good job with it so I would not recommend that she came off the pump. -She feels that the t:slim is not as good for her as it was the Medtronic, but reviewing her downloads, I would encourage her to continue. -For now, the only trends that I can see are for higher blood sugars in the late morning and early afternoon, when her basal rates are the lowest, at 0.5 units/h. Therefore, for now, I suggested to increase this to 0.7 units/h, but otherwise, we continued the rest of the basal rates. -The only other change that I would suggest is to decrease her active insulin time from 5 h to 4 h. -Her sugars after meals appear to be appropriate, so for now we'll continue the insulin to carb ratios and the insulin sensitivity factors. -Her latest HbA1c was high, at 9.8%, at last visit, but her average blood sugar for the last 2 weeks is 164, and equivalent to an HbA1c of approximately  7.4%. This is an excellent improvement. - I advised her to:  Patient Instructions  Please change the insulin pump settings: - basal rates: 12 am: 0.800 units/h 3 am: 0.875 9:30 am: 0.500 >> 0.700 5 pm: 1.300 6:30 pm: 1.200 11:30 pm: 1.500 - ICR: 1:8 - target: 130 - ISF:  12 am: 1:20 8 pm: 1:30 - Active insulin time: 5h >> 4h  Please schedule an appt with Jeanie Sewer.  Please return in 1.5 months with your sugar log.   - advised to check sugars at different times of the day - 4x a day, rotating check times - advised for yearly eye exams >> she is UTD - return to clinic in 1.5 months   2.  Hashimoto's hypothyroidism - latest thyroid labs reviewed with pt >> normal: Lab Results  Component Value Date   TSH 1.73 11/27/2019   - she continues on Synthroid d.a.w. 64 mcg daily (75 mcg 6/7 days) - pt feels better on this lower dose. - we discussed about taking the thyroid hormone every day, with water, >30 minutes before breakfast, separated by >4 hours from acid reflux medications, calcium, iron, multivitamins. Pt. is taking it correctly.  3. HL -Reviewed latest lipid panel from 08/2019: LDL still above goal, but improved from 2017: Lab Results  Component Value Date   CHOL 212 (H) 09/10/2019   HDL 40.40 09/10/2019   [place]  141 (H) 09/10/2019   LDLDIRECT 177.0 08/09/2016   TRIG 153.0 (H) 09/10/2019   CHOLHDL 5 09/10/2019  -She is on Crestor 10-she has muscle aches with it  - time spent with the patient:35 min, of which >50% was spent in reviewing her pump and blood sugar downloads, discussing her  hyper-glycemic episodes, reviewing previous labs and pump settings and developing a plan to avoid hypo- and hyper-glycemia. We also discussed about her other endocrine problems and new diagnosis of mild cognitive impairment-also discussed about how diet advances this and what other things she can do to improve memory.   Philemon Kingdom, MD PhD [facility]

## 2019-12-10 NOTE — Patient Instructions (Signed)
Please change the insulin pump settings: - basal rates: 12 am: 0.800 units/h 3 am: 0.875 9:30 am: 0.500 >> 0.700 5 pm: 1.300 6:30 pm: 1.200 11:30 pm: 1.500 - ICR: 1:8 - target: 130 - ISF:  12 am: 1:20 8 pm: 1:30 - Active insulin time: 5h >> 4h  Please schedule an appt with Jeanie Sewer.  Please return in 1.5 months with your sugar log.

## 2019-12-11 MED ORDER — METOPROLOL SUCCINATE ER 50 MG PO TB24: 50.0000 mg | ORAL_TABLET | Freq: Every day | ORAL | 11 refills | Status: DC

## 2019-12-17 ENCOUNTER — Ambulatory Visit: Payer: PPO | Admitting: Family

## 2019-12-19 ENCOUNTER — Encounter: Payer: Self-pay | Admitting: Family Medicine

## 2019-12-20 DIAGNOSIS — E109 Type 1 diabetes mellitus without complications: Secondary | ICD-10-CM | POA: Diagnosis not present

## 2019-12-24 MED ORDER — HYDROCOD POLST-CPM POLST ER 10-8 MG/5ML PO SUER: 5.0000 mL | Freq: Every evening | ORAL | 0 refills | Status: DC | PRN

## 2019-12-26 DIAGNOSIS — K56600 Partial intestinal obstruction, unspecified as to cause: Secondary | ICD-10-CM

## 2019-12-26 HISTORY — DX: Partial intestinal obstruction, unspecified as to cause: K56.600

## 2019-12-26 MED ORDER — BENZONATATE 100 MG PO CAPS: 100.0000 mg | ORAL_CAPSULE | Freq: Three times a day (TID) | ORAL | 0 refills | Status: DC | PRN

## 2019-12-26 NOTE — Addendum Note (Signed)
Addended by: [name] on: 12/26/2019 03:03 PM   Modules accepted: Orders

## 2019-12-29 DIAGNOSIS — E109 Type 1 diabetes mellitus without complications: Secondary | ICD-10-CM | POA: Diagnosis not present

## 2019-12-31 ENCOUNTER — Telehealth: Payer: Self-pay

## 2019-12-31 NOTE — Telephone Encounter (Signed)
LMN for pump and supplies filled out and faxed to [facility].

## 2020-01-01 ENCOUNTER — Ambulatory Visit: Payer: PPO | Admitting: Internal Medicine

## 2020-01-03 ENCOUNTER — Other Ambulatory Visit: Payer: Self-pay

## 2020-01-03 ENCOUNTER — Encounter: Payer: Self-pay | Admitting: Family Medicine

## 2020-01-03 ENCOUNTER — Ambulatory Visit (INDEPENDENT_AMBULATORY_CARE_PROVIDER_SITE_OTHER): Payer: PPO | Admitting: Family Medicine

## 2020-01-03 VITALS — BP 136/70 | HR 98 | Temp 97.6°F | Ht 63.5 in | Wt 152.4 lb

## 2020-01-03 DIAGNOSIS — M546 Pain in thoracic spine: Secondary | ICD-10-CM

## 2020-01-03 DIAGNOSIS — R05 Cough: Secondary | ICD-10-CM | POA: Diagnosis not present

## 2020-01-03 DIAGNOSIS — G8929 Other chronic pain: Secondary | ICD-10-CM | POA: Diagnosis not present

## 2020-01-03 DIAGNOSIS — R059 Cough, unspecified: Secondary | ICD-10-CM

## 2020-01-03 DIAGNOSIS — M797 Fibromyalgia: Secondary | ICD-10-CM | POA: Diagnosis not present

## 2020-01-03 DIAGNOSIS — G3184 Mild cognitive impairment, so stated: Secondary | ICD-10-CM

## 2020-01-03 MED ORDER — TIZANIDINE HCL 2 MG PO TABS: 2.0000 mg | ORAL_TABLET | Freq: Two times a day (BID) | ORAL | 0 refills | Status: DC | PRN

## 2020-01-03 NOTE — Progress Notes (Signed)
This visit was conducted in person.  BP 136/70 (BP Location: Left Arm, Patient Position: Sitting, Cuff Size: Normal)   Pulse 98   Temp 97.6 F (36.4 C) (Temporal)   Ht 5' 3.5" (1.613 m)   Wt 152 lb 7 oz (69.1 kg)   SpO2 99%   BMI 26.58 kg/m    CC: 4 wk f/u visit  Subjective:    Patient ID: Cassandra Benson, female    DOB: 10-Oct-1940, 79 y.o.   MRN: [id number]  HPI: Cassandra Benson is a 79 y.o. female presenting on 01/03/2020 for Follow-up (Here for 4-6 wk MCI f/u.)   See prior note for details. Started namenda 73m bid - tolerating well, but no significant noted improvement. She enjoys participating in bible study as well as word searches.   Fortunately, longevity runs in the family.  No significant fmhx memory trouble.   Ongoing pain mid thoracic spine prsesnt for years, but acutely worse over the past 5 weeks. Last thoracic films 04/2019 - no compression fracture. Denies inciting trauma/injury or falls. Manages with tylenol 5034mTID.  Ongoing cough for months now. Worse with exertion ie sweeping house. Spasmodic cough. Easily gets strangled when eating. No dysphagia. Tessalon perls do help. Tussionex cough syrup also helps. On pantoprazole a few times a week PRN for GERD.      Relevant past medical, surgical, family and social history reviewed and updated as indicated. Interim medical history since our last visit reviewed. Allergies and medications reviewed and updated. Outpatient Medications Prior to Visit  Medication Sig Dispense Refill  . aspirin EC 81 MG tablet Take 81 mg by mouth daily.    . BD ULTRA-FINE LANCETS lancets Use as instructed upto 6 times daily 600 each 2  . benzocaine (ORAJEL) 10 % mucosal gel Use as directed 1 application in the mouth or throat 4 (four) times daily as needed for mouth pain.    . benzonatate (TESSALON) 100 MG capsule Take 1 capsule (100 mg total) by mouth 3 (three) times daily as needed for cough. 30 capsule 0  .  chlorpheniramine-HYDROcodone (TUSSIONEX PENNKINETIC ER) 10-8 MG/5ML SUER Take 5 mLs by mouth at bedtime as needed for cough (sedation precautions). 100 mL 0  . clopidogrel (PLAVIX) 75 MG tablet Take 1 tablet (75 mg total) by mouth daily. 30 tablet 6  . diazepam (VALIUM) 5 MG tablet Take 0.5-1 tablets (2.5-5 mg total) by mouth every 12 (twelve) hours as needed for anxiety (MRI - use with driver, sedation precautions). 2 tablet 0  . diclofenac sodium (VOLTAREN) 1 % GEL Apply 1 application topically 3 (three) times daily. 1 Tube 1  . glucagon (GLUCAGON EMERGENCY) 1 MG injection Inject 1 mg into the muscle once as needed for up to 1 dose. 1 each 12  . glucose blood (CONTOUR NEXT TEST) test strip Use as instructed to check 4 times daily 400 each 5  . insulin aspart (NOVOLOG) 100 UNIT/ML FlexPen Inject 6-10 Units into the skin 3 (three) times daily with meals. 15 mL 11  . insulin aspart (NOVOLOG) 100 UNIT/ML injection Use up to 50 units a day in the insulin pump 50 mL 3  . Insulin Pen Needle 32G X 4 MM MISC Use 4-5x a day 300 each 3  . Krill Oil 500 MG CAPS Take 2 capsules (1,000 mg total) by mouth daily. (Patient taking differently: Take 500 mg by mouth daily. )    . lisinopril (ZESTRIL) 10 MG tablet TAKE 1 TABLET(10 MG) BY  MOUTH DAILY 90 tablet 0  . memantine (NAMENDA) 5 MG tablet Take 1 tablet (5 mg total) by mouth daily for 7 days, THEN 1 tablet (5 mg total) 2 (two) times daily. 60 tablet 1  . metoprolol succinate (TOPROL-XL) 50 MG 24 hr tablet Take 1 tablet (50 mg total) by mouth daily. 30 tablet 11  . neomycin-polymyxin-dexamethasone (MAXITROL) 0.1 % ophthalmic suspension Place 1 drop into both eyes daily.    . nitroGLYCERIN (NITROSTAT) 0.4 MG SL tablet Place 1 tablet (0.4 mg total) under the tongue every 5 (five) minutes as needed for chest pain. 30 tablet 1  . pantoprazole (PROTONIX) 40 MG tablet Take 1 tablet (40 mg total) by mouth daily as needed (GERD).    Marland Kitchen rosuvastatin (CRESTOR) 10 MG tablet  Take 1 tablet (10 mg total) by mouth daily. 30 tablet 5  . SYNTHROID 75 MCG tablet TAKE 1 TABLET BY MOUTH ONCE DAILY FOR 6 DAYS AND 1 AND 1/2 TABLETS ON THE 7TH DAY 105 tablet 3  . acetaminophen (TYLENOL) 500 MG tablet Take 1 tablet (500 mg total) by mouth 3 (three) times daily as needed.    . Continuous Blood Gluc Receiver (DEXCOM G6 RECEIVER) DEVI 1 kit by Does not apply route See admin instructions. Use to check blood sugar 4 times a day (Patient not taking: Reported on 01/03/2020) 1 each 0  . Continuous Blood Gluc Sensor (DEXCOM G6 SENSOR) MISC 1 each by Does not apply route See admin instructions. Change sensor every 10 days (Patient not taking: Reported on 01/03/2020) 3 each 3  . Continuous Blood Gluc Transmit (DEXCOM G6 TRANSMITTER) MISC 1 kit by Does not apply route See admin instructions. Use to check blood sugar (Patient not taking: Reported on 01/03/2020) 1 each 1   No facility-administered medications prior to visit.     Per HPI unless specifically indicated in ROS section below Review of Systems Objective:    BP 136/70 (BP Location: Left Arm, Patient Position: Sitting, Cuff Size: Normal)   Pulse 98   Temp 97.6 F (36.4 C) (Temporal)   Ht 5' 3.5" (1.613 m)   Wt 152 lb 7 oz (69.1 kg)   SpO2 99%   BMI 26.58 kg/m   Wt Readings from Last 3 Encounters:  01/03/20 152 lb 7 oz (69.1 kg)  12/10/19 156 lb (70.8 kg)  11/27/19 154 lb (69.9 kg)    Physical Exam Vitals and nursing note reviewed.  Constitutional:      Appearance: Normal appearance. She is not ill-appearing.  Cardiovascular:     Rate and Rhythm: Normal rate and regular rhythm.     Pulses: Normal pulses.     Heart sounds: Normal heart sounds. No murmur.  Pulmonary:     Effort: Pulmonary effort is normal. No respiratory distress.     Breath sounds: Normal breath sounds. No wheezing, rhonchi or rales.  Musculoskeletal:        General: Tenderness present. Normal range of motion.     Comments: Midline entire spine pain  diffiusely as well as paraspinous mm tenderness  Skin:    General: Skin is warm and dry.     Findings: No erythema or rash.  Neurological:     Mental Status: She is alert.  Psychiatric:        Mood and Affect: Mood normal.        Behavior: Behavior normal.       Results for orders placed or performed in visit on 11/27/19  TSH  Result  Value Ref Range   TSH 1.73 0.35 - 4.50 uIU/mL  Sedimentation rate  Result Value Ref Range   Sed Rate 25 0 - 30 mm/hr  High sensitivity CRP  Result Value Ref Range   CRP, High Sensitivity 68.600 (H) 0.000 - 5.000 mg/L  ANA  Result Value Ref Range   Anti Nuclear Antibody (ANA) Negative Negative  T4, free  Result Value Ref Range   Free T4 1.05 0.60 - 1.60 ng/dL   Assessment & Plan:  This visit occurred during the SARS-CoV-2 public health emergency.  Safety protocols were in place, including screening questions prior to the visit, additional usage of staff PPE, and extensive cleaning of exam room while observing appropriate contact time as indicated for disinfecting solutions.   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 Brain MRI - unremarkable for age.  She notes ongoing difficulty with memory, hasn't noted much benefit on namenda but is tolerating well - could continue vs stop. Will continue for now.  Discussed how testing is most consistent with normal aging to possible slight mild cognitive impairment (largely based on CDT and subjective description of troubles) - she does NOT have dementia. Discussed how this may remain very stable over the years. She fortunately has good family precedence with longevity and no significant cognitive issues. Continue preventative measures as up to now.       Fibromyalgia    Possible flare contributing to current thoracic back pain. Trial tizanidine musle relaxant with sedation  precautions.      Relevant Medications   acetaminophen (TYLENOL) 500 MG tablet   tiZANidine (ZANAFLEX) 2 MG tablet   Cough    Ongoing for months now. She had reassuring CXR at ER last month (seen for hyperglycemia).  Describes spasmodic cough associated with some choking with foods leading to cough. Tessalon and tussionex has helped. Possible GERD contribution in h/o same - recommend trial pantoprazole 79m daily x 2-3 wks to see effect on cough. Ok to continue tessalon perls PRN. Update with effect of above.       Chronic midline thoracic back pain    Recent worsening, reviewed plain thoracic films from last year (04/2019) without compression fracture. No recent trauma/injury to need repeat films at this time. Anticipate combination of degenerative changes and fibromyalgia flare (given tender to touch throughout large portion of spine). Provided with stretching exercises for upper back. Trial tizanidine muscle relaxant along with her scheduled tylenol 50101mTID.       Relevant Medications   acetaminophen (TYLENOL) 500 MG tablet   tiZANidine (ZANAFLEX) 2 MG tablet       Meds ordered this encounter  Medications  . tiZANidine (ZANAFLEX) 2 MG tablet    Sig: Take 1 tablet (2 mg total) by mouth 2 (two) times daily as needed for muscle spasms (sedation precautions).    Dispense:  30 tablet    Refill:  0   No orders of the defined types were placed in this encounter.   Patient Instructions  Memory was overall ok - on border between mild cognitive impairment and normal aging process.  Ok to continue tylenol for back pain. Do gentle stretching exercises daily as well.  Trial pantoprazole 4087maily for 3 weeks - and note effect on cough.  Trial tizanidine 2mg21mr muscle spasm/tightness.  Return as needed or in 6 weeks  for follow up visit.    Follow up plan: Return in about 6 weeks (around 02/14/2020), or if symptoms worsen or fail to improve, for follow up visit.  [name], MD

## 2020-01-03 NOTE — Patient Instructions (Addendum)
Memory was overall ok - on border between mild cognitive impairment and normal aging process.  Ok to continue tylenol for back pain. Do gentle stretching exercises daily as well.  Trial pantoprazole 40mg  daily for 3 weeks - and note effect on cough.  Trial tizanidine 2mg  for muscle spasm/tightness.  Return as needed or in 6 weeks for follow up visit.

## 2020-01-05 DIAGNOSIS — R05 Cough: Secondary | ICD-10-CM | POA: Insufficient documentation

## 2020-01-05 DIAGNOSIS — R059 Cough, unspecified: Secondary | ICD-10-CM | POA: Insufficient documentation

## 2020-01-05 NOTE — Assessment & Plan Note (Addendum)
Reviewed memory testing done 09/2019 - MMSE 28/30, clock drawing test 2/4. Brain MRI - unremarkable for age.  She notes ongoing difficulty with memory, hasn't noted much benefit on namenda but is tolerating well - could continue vs stop. Will continue for now.  Discussed how testing is most consistent with normal aging to possible slight mild cognitive impairment (largely based on CDT and subjective description of troubles) - she does NOT have dementia. Discussed how this may remain very stable over the years. She fortunately has good family precedence with longevity and no significant cognitive issues. Continue preventative measures as up to now.

## 2020-01-05 NOTE — Assessment & Plan Note (Addendum)
Ongoing for months now. She had reassuring CXR at ER last month (seen for hyperglycemia).  Describes spasmodic cough associated with some choking with foods leading to cough. Tessalon and tussionex has helped. Possible GERD contribution in h/o same - recommend trial pantoprazole 40mg  daily x 2-3 wks to see effect on cough. Ok to continue tessalon perls PRN. Update with effect of above.

## 2020-01-05 NOTE — Assessment & Plan Note (Signed)
Possible flare contributing to current thoracic back pain. Trial tizanidine musle relaxant with sedation precautions.

## 2020-01-05 NOTE — Assessment & Plan Note (Addendum)
Recent worsening, reviewed plain thoracic films from last year (04/2019) without compression fracture. No recent trauma/injury to need repeat films at this time. Anticipate combination of degenerative changes and fibromyalgia flare (given tender to touch throughout large portion of spine). Provided with stretching exercises for upper back. Trial tizanidine muscle relaxant along with her scheduled tylenol 500mg  TID.

## 2020-01-08 ENCOUNTER — Other Ambulatory Visit: Payer: Self-pay | Admitting: Family Medicine

## 2020-01-08 DIAGNOSIS — Z1231 Encounter for screening mammogram for malignant neoplasm of breast: Secondary | ICD-10-CM

## 2020-01-09 DIAGNOSIS — E109 Type 1 diabetes mellitus without complications: Secondary | ICD-10-CM | POA: Diagnosis not present

## 2020-01-13 ENCOUNTER — Ambulatory Visit (INDEPENDENT_AMBULATORY_CARE_PROVIDER_SITE_OTHER): Payer: PPO | Admitting: Family Medicine

## 2020-01-13 ENCOUNTER — Encounter: Payer: Self-pay | Admitting: Family Medicine

## 2020-01-13 ENCOUNTER — Encounter: Payer: Self-pay | Admitting: Dietician

## 2020-01-13 ENCOUNTER — Other Ambulatory Visit: Payer: Self-pay

## 2020-01-13 ENCOUNTER — Encounter: Payer: PPO | Attending: Internal Medicine | Admitting: Dietician

## 2020-01-13 VITALS — BP 126/78 | HR 78 | Temp 97.8°F | Ht 63.5 in | Wt 152.0 lb

## 2020-01-13 DIAGNOSIS — M546 Pain in thoracic spine: Secondary | ICD-10-CM | POA: Diagnosis not present

## 2020-01-13 DIAGNOSIS — E139 Other specified diabetes mellitus without complications: Secondary | ICD-10-CM | POA: Insufficient documentation

## 2020-01-13 DIAGNOSIS — R059 Cough, unspecified: Secondary | ICD-10-CM

## 2020-01-13 DIAGNOSIS — R05 Cough: Secondary | ICD-10-CM | POA: Diagnosis not present

## 2020-01-13 DIAGNOSIS — T63461A Toxic effect of venom of wasps, accidental (unintentional), initial encounter: Secondary | ICD-10-CM

## 2020-01-13 DIAGNOSIS — G8929 Other chronic pain: Secondary | ICD-10-CM | POA: Diagnosis not present

## 2020-01-13 DIAGNOSIS — R040 Epistaxis: Secondary | ICD-10-CM | POA: Diagnosis not present

## 2020-01-13 NOTE — Progress Notes (Signed)
Diabetes Self-Management Education  Visit Type: First/Initial  Appt. Start Time: 1020 Appt. End Time: 1200  01/13/2020  [name], identified by name and date of birth, is a 79 y.o. female with a diagnosis of Diabetes: Type 1(LADA).   ASSESSMENT Patient is here today alone to establish a relationship with a CDCES. She is currently not wearing her Dexcom as the app uninstalled from her phone.   She states that she will call tech support to work on this.  She stated in March her blood sugars were running in the 500's. She states that her insulin pump malfunctioned and has since gotten a refurbished pump.  History includes LADA- managed as type 1 (hx of diabetes for 20 years and on an insulin pump since about 2008), Sjogren's syndrome, Hashimoto's Thyroiditis, Lupus, Monkeberg's Sclerosis, Raynaud's syndrome, fibromyalgia, and recent diagnosis of mild cognitive impairment.  A1C 9.8% 10/08/19 which has recently been consistently elevated and stable since 06/2019. Medications include Novolog via pump, vitamin C  Weight 152 at MD office 01/13/20 decreased from 156 at Dr. Arman Filter office 12/10/2019.  She has a T-slim insulin pump and dexcom CGM.  She previously had the Medtronic pump and preferred this. She has the Dexcom with her and has had a problem with the app.  She lives alone.  Her husband died 4 years ago of appendix cancer and grandson commit suicide 7 year ago. 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 She rarely eats red meat.  She reports decreased appetite recently. Her son and his family come for dinner each Sunday.  She eats out with her sister 2 times per week. Avoids high fructose corn syrup. Housework causes her increases pain. She walks 4 days per week for 30 minutes.  Height 5' 3.5" (1.613 m), weight 152 lb (68.9 kg). Body mass index is 26.5 kg/m.  Diabetes Self-Management Education - 01/13/20 1129      Visit Information   Visit Type   First/Initial      Initial Visit   Diabetes Type  Type 1   LADA   Are you currently following a meal plan?  Yes    Are you taking your medications as prescribed?  Yes    Date Diagnosed  2000      Health Coping   How would you rate your overall health?  Fair      Psychosocial Assessment   Patient Belief/Attitude about Diabetes  Motivated to manage diabetes    Self-care barriers  Other (comment)   mild cognitive impairment   Self-management support  Doctor's office    Other persons present  Patient    Patient Concerns  Other (comment)   establish care   Special Needs  None    Preferred Learning Style  No preference indicated    Learning Readiness  Ready    How often do you need to have someone help you when you read instructions, pamphlets, or other written materials from your doctor or pharmacy?  1 - Never    What is the last grade level you completed in school?  3 years college      Pre-Education Assessment   Patient understands the diabetes disease and treatment process.  Needs Review    Patient understands incorporating nutritional management into lifestyle.  Needs Review    Patient undertands incorporating physical activity into lifestyle.  Needs Review    Patient understands using medications safely.  Needs Review    Patient understands monitoring  blood glucose, interpreting and using results  Needs Review    Patient understands prevention, detection, and treatment of acute complications.  Needs Review    Patient understands prevention, detection, and treatment of chronic complications.  Needs Review    Patient understands how to develop strategies to address psychosocial issues.  Needs Review    Patient understands how to develop strategies to promote health/change behavior.  Needs Review      Complications   Last HgB A1C per patient/outside source  9.8 %   10/08/2019   How often do you check your blood sugar?  > 4 times/day    Fasting Blood glucose range (mg/dL)  70-129     Postprandial Blood glucose range (mg/dL)  180-200    Number of hypoglycemic episodes per month  1    Can you tell when your blood sugar is low?  Yes    What do you do if your blood sugar is low?  peppermint or other candy    Number of hyperglycemic episodes per week  3    Can you tell when your blood sugar is high?  Yes    What do you do if your blood sugar is high?  takes insulin    Have you had a dilated eye exam in the past 12 months?  Yes    Have you had a dental exam in the past 12 months?  Yes    Are you checking your feet?  Yes    How many days per week are you checking your feet?  7      Dietary Intake   Breakfast  Oatmeal OR scrambled egg, buttered bread with cinnamon or low sugar jelly AND fresh or canned fruit   10   Lunch  tuna and mayo, crackers OR soup AND fruit OR sandwich PB and jam   2   Snack (afternoon)  raw vegetables with vidalia onion dressing    Dinner  chicken or pork, sweet potatoes and vegetables AND fruit   6-8   Snack (evening)  occasional small amount    Beverage(s)  water, pomegranate juice (diluted), coffee with 2% milk and sweet and low or 1/2 tsp honey, lemonade (sugar free), diet coke      Exercise   Exercise Type  Light (walking / raking leaves)    How many days per week to you exercise?  4    How many minutes per day do you exercise?  30    Total minutes per week of exercise  120      Patient Education   Previous Diabetes Education  Yes (please comment)   09/2019   Nutrition management   Carbohydrate counting;Food label reading, portion sizes and measuring food.    Physical activity and exercise   Role of exercise on diabetes management, blood pressure control and cardiac health.    Medications  Reviewed patients medication for diabetes, action, purpose, timing of dose and side effects.    Acute complications  Taught treatment of hypoglycemia - the 15 rule.;Discussed and identified patients' treatment of hyperglycemia.;Other (comment)    Discussed storage of glucagon and other glucagon options   Chronic complications  Identified and discussed with patient  current chronic complications    Psychosocial adjustment  Role of stress on diabetes;Worked with patient to identify barriers to care and solutions;Identified and addressed patients feelings and concerns about diabetes      Individualized Goals (developed by patient)   Nutrition  Other (comment)   continued  carbohydrate counting   Physical Activity  30 minutes per day;Exercise 5-7 days per week    Medications  take my medication as prescribed    Monitoring   test my blood glucose as discussed    Reducing Risk  examine blood glucose patterns    Health Coping  discuss diabetes with (comment)   MD, RD, CDE     Post-Education Assessment   Patient understands the diabetes disease and treatment process.  Demonstrates understanding / competency    Patient understands incorporating nutritional management into lifestyle.  Needs Review    Patient undertands incorporating physical activity into lifestyle.  Demonstrates understanding / competency    Patient understands using medications safely.  Demonstrates understanding / competency    Patient understands monitoring blood glucose, interpreting and using results  Demonstrates understanding / competency    Patient understands prevention, detection, and treatment of acute complications.  Demonstrates understanding / competency    Patient understands prevention, detection, and treatment of chronic complications.  Demonstrates understanding / competency    Patient understands how to develop strategies to address psychosocial issues.  Needs Review    Patient understands how to develop strategies to promote health/change behavior.  Demonstrates understanding / competency      Outcomes   Expected Outcomes  Demonstrated interest in learning. Expect positive outcomes    Future DMSE  4-6 wks    Program Status  Not Completed        Individualized Plan for Diabetes Self-Management Training:   Learning Objective:  Patient will have a greater understanding of diabetes self-management. Patient education plan is to attend individual and/or group sessions per assessed needs and concerns.   Plan:   Patient Instructions  If the cinnamon effects your reflux then avoid. Continue to stay active.  Walking  "Sit and be fit" video on line or on [employer]  Stationary bike Please call or e-mail for any questions.  Calorie [employer] (free app for your phone) Continue your diligence with your diabetes care.    Expected Outcomes:  Demonstrated interest in learning. Expect positive outcomes  Education material provided: Meal plan card  If problems or questions, patient to contact team via:  Phone and Email  Future DSME appointment: 4-6 wks

## 2020-01-13 NOTE — Assessment & Plan Note (Signed)
Actually significant improvement with daily protonix - rec finish 3 wk daily course then change to MWF. Discussed anticipated contribution from hidden reflux

## 2020-01-13 NOTE — Patient Instructions (Addendum)
Continue protonix daily for 3 weeks then change to MWF.  Continue tizanidine. I'm glad both are helping! For nosebleeds - try nasal saline irrigation (over the counter).  Good to see you today, call us with questions. Keep follow up scheduled for next month.

## 2020-01-13 NOTE — Progress Notes (Signed)
This visit was conducted in person.  BP 126/78 (BP Location: Left Arm, Patient Position: Sitting, Cuff Size: Normal)   Pulse 78   Temp 97.8 F (36.6 C) (Temporal)   Ht 5' 3.5" (1.613 m)   Wt 152 lb (68.9 kg)   SpO2 98%   BMI 26.50 kg/m    CC: see prior note for details  Subjective:    Patient ID: Cassandra Benson, female    DOB: [birth year], 79 y.o.   MRN: [id number]  HPI: HAMNA ASA is a 79 y.o. female presenting on 01/13/2020 for No chief complaint on file.   See prior note for details.  Last visit we trial tizanidine 43m for muscle spasm/tightness as well as trial protonix 410mfor 3 wks to see effect on ongoing spasmodic cough - this has significantly helped cough! She previously was treated with tessalon perls and tussionex.   Thursday while working outdoors she had a wasp sting to L thumb. Treated with benadryl - she did have cold chills and nausea after this. Initial thumb redness/swelling, now actually getting better. No personal h/o wasp allergy.   cbg this morning 130.  To see diabetes educator later this morning.  She did have 2 nosebleeds last week. L>R.   Upcoming mammogram 02/03/2020     Relevant past medical, surgical, family and social history reviewed and updated as indicated. Interim medical history since our last visit reviewed. Allergies and medications reviewed and updated. Outpatient Medications Prior to Visit  Medication Sig Dispense Refill  . acetaminophen (TYLENOL) 500 MG tablet Take 1 tablet (500 mg total) by mouth 3 (three) times daily as needed.    . Marland Kitchenspirin EC 81 MG tablet Take 81 mg by mouth daily.    . BD ULTRA-FINE LANCETS lancets Use as instructed upto 6 times daily 600 each 2  . benzocaine (ORAJEL) 10 % mucosal gel Use as directed 1 application in the mouth or throat 4 (four) times daily as needed for mouth pain.    . benzonatate (TESSALON) 100 MG capsule Take 1 capsule (100 mg total) by mouth 3 (three) times daily as needed for cough.  30 capsule 0  . chlorpheniramine-HYDROcodone (TUSSIONEX PENNKINETIC ER) 10-8 MG/5ML SUER Take 5 mLs by mouth at bedtime as needed for cough (sedation precautions). 100 mL 0  . clopidogrel (PLAVIX) 75 MG tablet Take 1 tablet (75 mg total) by mouth daily. 30 tablet 6  . Continuous Blood Gluc Receiver (DEXCOM G6 RECEIVER) DEVI 1 kit by Does not apply route See admin instructions. Use to check blood sugar 4 times a day 1 each 0  . Continuous Blood Gluc Sensor (DEXCOM G6 SENSOR) MISC 1 each by Does not apply route See admin instructions. Change sensor every 10 days 3 each 3  . Continuous Blood Gluc Transmit (DEXCOM G6 TRANSMITTER) MISC 1 kit by Does not apply route See admin instructions. Use to check blood sugar 1 each 1  . diazepam (VALIUM) 5 MG tablet Take 0.5-1 tablets (2.5-5 mg total) by mouth every 12 (twelve) hours as needed for anxiety (MRI - use with driver, sedation precautions). 2 tablet 0  . diclofenac sodium (VOLTAREN) 1 % GEL Apply 1 application topically 3 (three) times daily. 1 Tube 1  . glucagon (GLUCAGON EMERGENCY) 1 MG injection Inject 1 mg into the muscle once as needed for up to 1 dose. 1 each 12  . glucose blood (CONTOUR NEXT TEST) test strip Use as instructed to check 4 times daily 400 each 5  .  insulin aspart (NOVOLOG) 100 UNIT/ML FlexPen Inject 6-10 Units into the skin 3 (three) times daily with meals. 15 mL 11  . insulin aspart (NOVOLOG) 100 UNIT/ML injection Use up to 50 units a day in the insulin pump 50 mL 3  . Insulin Pen Needle 32G X 4 MM MISC Use 4-5x a day 300 each 3  . Krill Oil 500 MG CAPS Take 2 capsules (1,000 mg total) by mouth daily. (Patient taking differently: Take 500 mg by mouth daily. )    . lisinopril (ZESTRIL) 10 MG tablet TAKE 1 TABLET(10 MG) BY MOUTH DAILY 90 tablet 0  . metoprolol succinate (TOPROL-XL) 50 MG 24 hr tablet Take 1 tablet (50 mg total) by mouth daily. 30 tablet 11  . neomycin-polymyxin-dexamethasone (MAXITROL) 0.1 % ophthalmic suspension Place  1 drop into both eyes daily.    . nitroGLYCERIN (NITROSTAT) 0.4 MG SL tablet Place 1 tablet (0.4 mg total) under the tongue every 5 (five) minutes as needed for chest pain. 30 tablet 1  . pantoprazole (PROTONIX) 40 MG tablet Take 1 tablet (40 mg total) by mouth daily as needed (GERD).    Marland Kitchen rosuvastatin (CRESTOR) 10 MG tablet Take 1 tablet (10 mg total) by mouth daily. 30 tablet 5  . SYNTHROID 75 MCG tablet TAKE 1 TABLET BY MOUTH ONCE DAILY FOR 6 DAYS AND 1 AND 1/2 TABLETS ON THE 7TH DAY 105 tablet 3  . tiZANidine (ZANAFLEX) 2 MG tablet Take 1 tablet (2 mg total) by mouth 2 (two) times daily as needed for muscle spasms (sedation precautions). 30 tablet 0  . memantine (NAMENDA) 5 MG tablet Take 1 tablet (5 mg total) by mouth daily for 7 days, THEN 1 tablet (5 mg total) 2 (two) times daily. 60 tablet 1   No facility-administered medications prior to visit.     Per HPI unless specifically indicated in ROS section below Review of Systems Objective:    BP 126/78 (BP Location: Left Arm, Patient Position: Sitting, Cuff Size: Normal)   Pulse 78   Temp 97.8 F (36.6 C) (Temporal)   Ht 5' 3.5" (1.613 m)   Wt 152 lb (68.9 kg)   SpO2 98%   BMI 26.50 kg/m   Wt Readings from Last 3 Encounters:  01/13/20 152 lb (68.9 kg)  01/03/20 152 lb 7 oz (69.1 kg)  12/10/19 156 lb (70.8 kg)    Physical Exam Vitals and nursing note reviewed.  Constitutional:      Appearance: Normal appearance. She is not ill-appearing.  HENT:     Head: Normocephalic and atraumatic.     Nose: Nose normal. No congestion or rhinorrhea.     Comments: No obvious anterior nasal source of bleed Eyes:     Extraocular Movements: Extraocular movements intact.     Pupils: Pupils are equal, round, and reactive to light.  Cardiovascular:     Rate and Rhythm: Normal rate and regular rhythm.     Pulses: Normal pulses.     Heart sounds: Normal heart sounds. No murmur.  Pulmonary:     Effort: Pulmonary effort is normal. No  respiratory distress.     Breath sounds: Normal breath sounds. No wheezing, rhonchi or rales.  Musculoskeletal:     Cervical back: Normal range of motion and neck supple. No rigidity.     Right lower leg: No edema.     Left lower leg: No edema.  Lymphadenopathy:     Cervical: No cervical adenopathy.  Skin:    General: Skin is  warm and dry.     Findings: No rash.     Comments: L thumb with small healing puncture wound without surrounding erythema or edema   Neurological:     Mental Status: She is alert.  Psychiatric:        Mood and Affect: Mood normal.        Behavior: Behavior normal.       Results for orders placed or performed in visit on 11/27/19  TSH  Result Value Ref Range   TSH 1.73 0.35 - 4.50 uIU/mL  Sedimentation rate  Result Value Ref Range   Sed Rate 25 0 - 30 mm/hr  High sensitivity CRP  Result Value Ref Range   CRP, High Sensitivity 68.600 (H) 0.000 - 5.000 mg/L  ANA  Result Value Ref Range   Anti Nuclear Antibody (ANA) Negative Negative  T4, free  Result Value Ref Range   Free T4 1.05 0.60 - 1.60 ng/dL   Assessment & Plan:  This visit occurred during the SARS-CoV-2 public health emergency.  Safety protocols were in place, including screening questions prior to the visit, additional usage of staff PPE, and extensive cleaning of exam room while observing appropriate contact time as indicated for disinfecting solutions.   Problem List Items Addressed This Visit    Wasp sting    Seems to be healing well. Supportive care reviewed.       Epistaxis    No obvious lesion noted on nasal exam. Encouraged renewed efforts at keeping nasal passages moist.       Cough - Primary    Actually significant improvement with daily protonix - rec finish 3 wk daily course then change to MWF. Discussed anticipated contribution from hidden reflux       Chronic midline thoracic back pain    Improving with PRN tizanidine 63m- continue.          No orders of the defined  types were placed in this encounter.  No orders of the defined types were placed in this encounter.   Patient Instructions  Continue protonix daily for 3 weeks then change to MWF.  Continue tizanidine. I'm glad both are helping! For nosebleeds - try nasal saline irrigation (over the counter).  Good to see you today, call [place] questions. Keep follow up scheduled for next month.   Follow up plan: Return if symptoms worsen or fail to improve.  JRia Bush MD

## 2020-01-13 NOTE — Assessment & Plan Note (Signed)
Improving with PRN tizanidine 2mg - continue.

## 2020-01-13 NOTE — Assessment & Plan Note (Signed)
Seems to be healing well. Supportive care reviewed.

## 2020-01-13 NOTE — Assessment & Plan Note (Signed)
No obvious lesion noted on nasal exam. Encouraged renewed efforts at keeping nasal passages moist.

## 2020-01-13 NOTE — Patient Instructions (Addendum)
If the cinnamon effects your reflux then avoid. Continue to stay active.  Walking  "Sit and be fit" video on line or on [employer]  Stationary bike Please call or e-mail for any questions.  Calorie [employer] (free app for your phone) Continue your diligence with your diabetes care.

## 2020-01-14 ENCOUNTER — Encounter: Payer: Self-pay | Admitting: Emergency Medicine

## 2020-01-14 ENCOUNTER — Other Ambulatory Visit: Payer: Self-pay

## 2020-01-14 DIAGNOSIS — Z823 Family history of stroke: Secondary | ICD-10-CM

## 2020-01-14 DIAGNOSIS — Z20822 Contact with and (suspected) exposure to covid-19: Secondary | ICD-10-CM | POA: Diagnosis not present

## 2020-01-14 DIAGNOSIS — R52 Pain, unspecified: Secondary | ICD-10-CM | POA: Diagnosis not present

## 2020-01-14 DIAGNOSIS — E1165 Type 2 diabetes mellitus with hyperglycemia: Secondary | ICD-10-CM | POA: Diagnosis present

## 2020-01-14 DIAGNOSIS — Z794 Long term (current) use of insulin: Secondary | ICD-10-CM

## 2020-01-14 DIAGNOSIS — R1084 Generalized abdominal pain: Secondary | ICD-10-CM | POA: Diagnosis not present

## 2020-01-14 DIAGNOSIS — Z7902 Long term (current) use of antithrombotics/antiplatelets: Secondary | ICD-10-CM

## 2020-01-14 DIAGNOSIS — Z79899 Other long term (current) drug therapy: Secondary | ICD-10-CM | POA: Diagnosis not present

## 2020-01-14 DIAGNOSIS — E785 Hyperlipidemia, unspecified: Secondary | ICD-10-CM | POA: Diagnosis not present

## 2020-01-14 DIAGNOSIS — R05 Cough: Secondary | ICD-10-CM | POA: Diagnosis not present

## 2020-01-14 DIAGNOSIS — Z825 Family history of asthma and other chronic lower respiratory diseases: Secondary | ICD-10-CM | POA: Diagnosis not present

## 2020-01-14 DIAGNOSIS — R1010 Upper abdominal pain, unspecified: Secondary | ICD-10-CM | POA: Diagnosis not present

## 2020-01-14 DIAGNOSIS — Z8249 Family history of ischemic heart disease and other diseases of the circulatory system: Secondary | ICD-10-CM | POA: Diagnosis not present

## 2020-01-14 DIAGNOSIS — K5730 Diverticulosis of large intestine without perforation or abscess without bleeding: Secondary | ICD-10-CM | POA: Diagnosis not present

## 2020-01-14 DIAGNOSIS — Z8261 Family history of arthritis: Secondary | ICD-10-CM | POA: Diagnosis not present

## 2020-01-14 DIAGNOSIS — K56600 Partial intestinal obstruction, unspecified as to cause: Secondary | ICD-10-CM | POA: Diagnosis not present

## 2020-01-14 DIAGNOSIS — K56609 Unspecified intestinal obstruction, unspecified as to partial versus complete obstruction: Secondary | ICD-10-CM | POA: Diagnosis not present

## 2020-01-14 DIAGNOSIS — Z818 Family history of other mental and behavioral disorders: Secondary | ICD-10-CM | POA: Diagnosis not present

## 2020-01-14 DIAGNOSIS — Z803 Family history of malignant neoplasm of breast: Secondary | ICD-10-CM

## 2020-01-14 DIAGNOSIS — Z9641 Presence of insulin pump (external) (internal): Secondary | ICD-10-CM | POA: Diagnosis present

## 2020-01-14 DIAGNOSIS — Z832 Family history of diseases of the blood and blood-forming organs and certain disorders involving the immune mechanism: Secondary | ICD-10-CM | POA: Diagnosis not present

## 2020-01-14 DIAGNOSIS — Z811 Family history of alcohol abuse and dependence: Secondary | ICD-10-CM | POA: Diagnosis not present

## 2020-01-14 DIAGNOSIS — R0689 Other abnormalities of breathing: Secondary | ICD-10-CM | POA: Diagnosis not present

## 2020-01-14 DIAGNOSIS — Z833 Family history of diabetes mellitus: Secondary | ICD-10-CM

## 2020-01-14 DIAGNOSIS — I10 Essential (primary) hypertension: Secondary | ICD-10-CM | POA: Diagnosis present

## 2020-01-14 DIAGNOSIS — Z4682 Encounter for fitting and adjustment of non-vascular catheter: Secondary | ICD-10-CM | POA: Diagnosis not present

## 2020-01-14 DIAGNOSIS — Z96641 Presence of right artificial hip joint: Secondary | ICD-10-CM | POA: Diagnosis present

## 2020-01-14 DIAGNOSIS — Z79890 Hormone replacement therapy (postmenopausal): Secondary | ICD-10-CM | POA: Diagnosis not present

## 2020-01-14 DIAGNOSIS — Z90710 Acquired absence of both cervix and uterus: Secondary | ICD-10-CM | POA: Diagnosis not present

## 2020-01-14 LAB — CBC
HCT: 41.7 % (ref 36.0–46.0)
Hemoglobin: 13.5 g/dL (ref 12.0–15.0)
MCH: 26.6 pg (ref 26.0–34.0)
MCHC: 32.4 g/dL (ref 30.0–36.0)
MCV: 82.1 fL (ref 80.0–100.0)
Platelets: 221 10*3/uL (ref 150–400)
RBC: 5.08 MIL/uL (ref 3.87–5.11)
RDW: 14.3 % (ref 11.5–15.5)
WBC: 11.4 10*3/uL — ABNORMAL HIGH (ref 4.0–10.5)
nRBC: 0 % (ref 0.0–0.2)

## 2020-01-14 MED ORDER — SODIUM CHLORIDE 0.9% FLUSH: 3.0000 mL | Freq: Once | INTRAVENOUS | Status: DC

## 2020-01-14 NOTE — ED Triage Notes (Signed)
Pt presents to ED from home via EMS with c/o generalized cramping abd pain since around 1pm today. Worse with standing. +nausea +headache

## 2020-01-14 NOTE — Progress Notes (Deleted)
Office Visit    Patient Name: [name] Date of Encounter: 01/14/2020  Primary Care Provider:  [name], MD Primary Cardiologist:  No primary care provider on file. Electrophysiologist:  None   Chief Complaint    Cassandra Benson is a 79 y.o. female with a hx of HTN, HLD, DM2, carotid artery disease presents today for elevated heart rates.   Past Medical History    Past Medical History:  Diagnosis Date  . Allergy   . ANA positive    positive ANA pattern 1 speckled  . Arthritis   . Carotid stenosis, asymptomatic 06/19/2015   123456 RICA 123456 LICA rpt 1 yr ([id number])   . Dermatomyositis ([place])   . Diabetes mellitus without complication (HCC)    Type 1  . Family history of adverse reaction to anesthesia    brothr went into cardiac arrest from anectine  . Fibromyalgia    prior PCP  . GERD (gastroesophageal reflux disease)    prior PCP  . Glaucoma    Narrow angle  . History of blood clots    DVT, in 20s, none since  . History of chicken pox   . History of diverticulitis   . History of pericarditis 1986   with hospitalization  . History of pneumonia 2014  . History of shingles   . History of UTI   . Hyperlipidemia   . Hypertension   . Hypothyroidism   . Mixed connective tissue disease ([place])   . PONV (postoperative nausea and vomiting)   . Raynaud's disease without gangrene   . Shoulder pain left   h/o RTC tendonitis and adhesive capsulitis  . Sjogren's syndrome ([place])   . Sleep apnea    prior PCP - no CPAP for about 10 yrs  . 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[place])   . Vitamin D deficiency    prior PCP   Past Surgical History:  Procedure Laterality Date  . ABDOMINAL HYSTERECTOMY  1978   fibroids and menorrhagia, ovaries remain  . ARTERY BIOPSY Right 04/06/2018   Procedure: BIOPSY TEMPORAL ARTERY RIGHT;  Surgeon: Beverly Gust, MD;  Location: [facility];  Service: ENT;  Laterality: Right;  Diabetic - insulin pump sleep apnea  . [facility] normal per patient  . COLONOSCOPY  10/2011   1 TA, 1 HP, very tortuous colon ([place])  . COLONOSCOPY WITH ESOPHAGOGASTRODUODENOSCOPY (EGD)  03/2007   2 ulcers, benign polyp, rpt 5 yrs ([place])  . JOINT REPLACEMENT Right    hip  . PARTIAL HIP ARTHROPLASTY  2013   Right hip replacement  . TONSILLECTOMY    . TONSILLECTOMY AND ADENOIDECTOMY    . TRANSCAROTID ARTERY REVASCULARIZATION Left 07/23/2018   Procedure: TRANSCAROTID ARTERY REVASCULARIZATION;  Surgeon: Marty Heck, MD;  Location: [facility];  Service: Vascular;  Laterality: Left;  . TUBAL LIGATION    . VAGINAL DELIVERY     x2, no complications    Allergies  Allergies  Allergen Reactions  . Iodinated Diagnostic Agents Other (See Comments)    Itching (severe) and chest tightness  . Penicillins Anaphylaxis, Swelling, Rash and Other (See Comments)    Has patient had a PCN reaction causing immediate rash, facial/tongue/throat swelling, SOB or lightheadedness with hypotension: Yes Has patient had a PCN reaction causing severe rash involving mucus membranes or skin necrosis: Unknown Has patient had a PCN reaction that required hospitalization: Unknown Has patient had a PCN reaction occurring within the last 10  years: No If all of the above answers are "NO", then may proceed with Cephalosporin use.   . Amlodipine Swelling    Pedal edema  . Anectine [Succinylcholine] Other (See Comments)    Brother went into cardiac arrest.  . Codeine Nausea Only  . Gabapentin Other (See Comments)    Gait abnormality  . Influenza Vaccines Other (See Comments)    Muscle weakness; unable to walk  . Nortriptyline Other (See Comments)    Eye swelling and mouth drawed up  . Pamelor [Nortriptyline Hcl] Other (See Comments)    Patient states caused her face to draw in together.  . Valsartan Other (See Comments) and Cough    Allergy to generic only, "Hacking" cough  . Erythromycin Rash and  Swelling  . Sulfa Antibiotics Rash    History of Present Illness    Cassandra Benson is a 79 y.o. female with a hx of HTN, HLD, LADA with DC, hypothyroidism, carotid artery disease s/p L carotid stent, mild cognitive impairment, rheumatologic autoimmune disease last seen 03/26/19 by Dr. Rockey Situ.  She has had chest pain previously with remote cardiac catheterization in 2000 with no significant disease. CT scan 2016 with no coronary calcifications, minimal descending aorta atherosclerosis. Lexiscan 03/2019 ordered for typical and atypical chest pain was low risk with no evidence of ischemia.   Echo 03/2018 LVEF 60-65%, gr1DD, no significant valvular abnormality.   Her carotid artery disease is s/p left carotid stent with angioplasty. Carotid duplex 09/03/19 with patent stent and right ICA 1-39% stenosed.   ***  EKGs/Labs/Other Studies Reviewed:   The following studies were reviewed today: ***  EKG:  EKG is ordered today.  The ekg ordered today demonstrates ***  Recent Labs: 10/07/2019: ALT 11 11/25/2019: BUN 14; Creatinine, Ser 0.98; Hemoglobin 13.5; Platelets 210; Potassium 4.2; Sodium 133 11/27/2019: TSH 1.73  Recent Lipid Panel    Component Value Date/Time   CHOL 212 (H) 09/10/2019 0749   CHOL 236 01/11/2016 0000   TRIG 153.0 (H) 09/10/2019 0749   TRIG 175 01/11/2016 0000   HDL 40.40 09/10/2019 0749   HDL 45 01/29/2015 0000   CHOLHDL 5 09/10/2019 0749   VLDL 30.6 09/10/2019 0749   LDLCALC 141 (H) 09/10/2019 0749   LDLCALC 160 01/11/2016 0000   LDLDIRECT 177.0 08/09/2016 0905    Home Medications   No outpatient medications have been marked as taking for the 01/15/20 encounter (Appointment) with Loel Dubonnet, NP.      Review of Systems    ***   ROS All other systems reviewed and are otherwise negative except as noted above.  Physical Exam    VS:  There were no vitals taken for this visit. , BMI There is no height or weight on file to calculate BMI. GEN: Well  nourished, well developed, in no acute distress. HEENT: normal. Neck: Supple, no JVD, carotid bruits, or masses. Cardiac: ***RRR, no murmurs, rubs, or gallops. No clubbing, cyanosis, edema.  ***Radials/DP/PT 2+ and equal bilaterally.  Respiratory:  ***Respirations regular and unlabored, clear to auscultation bilaterally. GI: Soft, nontender, nondistended, BS + x 4. MS: No deformity or atrophy. Skin: Warm and dry, no rash. Neuro:  Strength and sensation are intact. Psych: Normal affect.  Accessory Clinical Findings    ECG personally reviewed by me today - *** - no acute changes.  Assessment & Plan    1. HTN -  2. HLD - Lipid panel 09/10/19 total cholesterol 212, HDL 40.4, LDL 141, triglycerides 153.  3.  Carotid artery disease s/p L carotid stent -  4. DM2 - Follows with Dr. Cruzita Lederer of endocrinology.  Disposition: Follow up {follow up:[id number]} with Dr. Rockey Situ or APP.    Loel Dubonnet, NP 01/14/2020, 1:45 PM

## 2020-01-15 ENCOUNTER — Inpatient Hospital Stay: Payer: PPO

## 2020-01-15 ENCOUNTER — Inpatient Hospital Stay
Admission: EM | Admit: 2020-01-15 | Discharge: 2020-01-16 | DRG: 390 | Disposition: A | Discharge status: Home / Self Care | Payer: PPO | Attending: Family Medicine | Admitting: Family Medicine

## 2020-01-15 ENCOUNTER — Observation Stay: Payer: PPO

## 2020-01-15 ENCOUNTER — Telehealth: Payer: Self-pay | Admitting: Family Medicine

## 2020-01-15 ENCOUNTER — Emergency Department: Payer: PPO

## 2020-01-15 ENCOUNTER — Ambulatory Visit: Payer: PPO | Admitting: Family

## 2020-01-15 ENCOUNTER — Encounter: Payer: Self-pay | Admitting: Family Medicine

## 2020-01-15 DIAGNOSIS — K56600 Partial intestinal obstruction, unspecified as to cause: Secondary | ICD-10-CM

## 2020-01-15 DIAGNOSIS — Z7902 Long term (current) use of antithrombotics/antiplatelets: Secondary | ICD-10-CM | POA: Diagnosis not present

## 2020-01-15 DIAGNOSIS — Z20822 Contact with and (suspected) exposure to covid-19: Secondary | ICD-10-CM | POA: Diagnosis present

## 2020-01-15 DIAGNOSIS — Z794 Long term (current) use of insulin: Secondary | ICD-10-CM | POA: Diagnosis not present

## 2020-01-15 DIAGNOSIS — Z8249 Family history of ischemic heart disease and other diseases of the circulatory system: Secondary | ICD-10-CM | POA: Diagnosis not present

## 2020-01-15 DIAGNOSIS — Z79890 Hormone replacement therapy (postmenopausal): Secondary | ICD-10-CM | POA: Diagnosis not present

## 2020-01-15 DIAGNOSIS — Z833 Family history of diabetes mellitus: Secondary | ICD-10-CM | POA: Diagnosis not present

## 2020-01-15 DIAGNOSIS — Z803 Family history of malignant neoplasm of breast: Secondary | ICD-10-CM | POA: Diagnosis not present

## 2020-01-15 DIAGNOSIS — Z8261 Family history of arthritis: Secondary | ICD-10-CM | POA: Diagnosis not present

## 2020-01-15 DIAGNOSIS — Z4682 Encounter for fitting and adjustment of non-vascular catheter: Secondary | ICD-10-CM | POA: Diagnosis not present

## 2020-01-15 DIAGNOSIS — Z823 Family history of stroke: Secondary | ICD-10-CM | POA: Diagnosis not present

## 2020-01-15 DIAGNOSIS — Z811 Family history of alcohol abuse and dependence: Secondary | ICD-10-CM | POA: Diagnosis not present

## 2020-01-15 DIAGNOSIS — Z79899 Other long term (current) drug therapy: Secondary | ICD-10-CM | POA: Diagnosis not present

## 2020-01-15 DIAGNOSIS — Z825 Family history of asthma and other chronic lower respiratory diseases: Secondary | ICD-10-CM | POA: Diagnosis not present

## 2020-01-15 DIAGNOSIS — Z96641 Presence of right artificial hip joint: Secondary | ICD-10-CM | POA: Diagnosis present

## 2020-01-15 DIAGNOSIS — Z90710 Acquired absence of both cervix and uterus: Secondary | ICD-10-CM | POA: Diagnosis not present

## 2020-01-15 DIAGNOSIS — I10 Essential (primary) hypertension: Secondary | ICD-10-CM | POA: Diagnosis present

## 2020-01-15 DIAGNOSIS — K56609 Unspecified intestinal obstruction, unspecified as to partial versus complete obstruction: Secondary | ICD-10-CM | POA: Diagnosis present

## 2020-01-15 DIAGNOSIS — Z9641 Presence of insulin pump (external) (internal): Secondary | ICD-10-CM | POA: Diagnosis present

## 2020-01-15 DIAGNOSIS — E119 Type 2 diabetes mellitus without complications: Secondary | ICD-10-CM

## 2020-01-15 DIAGNOSIS — Z818 Family history of other mental and behavioral disorders: Secondary | ICD-10-CM | POA: Diagnosis not present

## 2020-01-15 DIAGNOSIS — E1165 Type 2 diabetes mellitus with hyperglycemia: Secondary | ICD-10-CM | POA: Diagnosis present

## 2020-01-15 DIAGNOSIS — E785 Hyperlipidemia, unspecified: Secondary | ICD-10-CM | POA: Diagnosis present

## 2020-01-15 DIAGNOSIS — K5730 Diverticulosis of large intestine without perforation or abscess without bleeding: Secondary | ICD-10-CM | POA: Diagnosis present

## 2020-01-15 DIAGNOSIS — Z832 Family history of diseases of the blood and blood-forming organs and certain disorders involving the immune mechanism: Secondary | ICD-10-CM | POA: Diagnosis not present

## 2020-01-15 LAB — URINALYSIS, COMPLETE (UACMP) WITH MICROSCOPIC
Bacteria, UA: NONE SEEN
Bilirubin Urine: NEGATIVE
Glucose, UA: NEGATIVE mg/dL
Hgb urine dipstick: NEGATIVE
Ketones, ur: NEGATIVE mg/dL
Nitrite: NEGATIVE
Protein, ur: NEGATIVE mg/dL
Specific Gravity, Urine: 1.008 (ref 1.005–1.030)
pH: 6 (ref 5.0–8.0)

## 2020-01-15 LAB — GLUCOSE, CAPILLARY
Glucose-Capillary: 179 mg/dL — ABNORMAL HIGH (ref 70–99)
Glucose-Capillary: 159 mg/dL — ABNORMAL HIGH (ref 70–99)
Glucose-Capillary: 108 mg/dL — ABNORMAL HIGH (ref 70–99)
Glucose-Capillary: 106 mg/dL — ABNORMAL HIGH (ref 70–99)
Glucose-Capillary: 93 mg/dL (ref 70–99)

## 2020-01-15 LAB — CBC
HCT: 43 % (ref 36.0–46.0)
Hemoglobin: 13.6 g/dL (ref 12.0–15.0)
MCH: 26.7 pg (ref 26.0–34.0)
MCHC: 31.6 g/dL (ref 30.0–36.0)
MCV: 84.5 fL (ref 80.0–100.0)
Platelets: 225 10*3/uL (ref 150–400)
RBC: 5.09 MIL/uL (ref 3.87–5.11)
RDW: 14.3 % (ref 11.5–15.5)
WBC: 10.2 10*3/uL (ref 4.0–10.5)
nRBC: 0 % (ref 0.0–0.2)

## 2020-01-15 LAB — CREATININE, SERUM
Creatinine, Ser: 0.86 mg/dL (ref 0.44–1.00)
GFR calc Af Amer: >60 mL/min (ref >60)
GFR calc non Af Amer: >60 mL/min (ref >60)

## 2020-01-15 LAB — COMPREHENSIVE METABOLIC PANEL
ALT: 12 U/L (ref 0–44)
AST: 17 U/L (ref 15–41)
Albumin: 3.8 g/dL (ref 3.5–5.0)
Alkaline Phosphatase: 68 U/L (ref 38–126)
Anion gap: 7 (ref 5–15)
BUN: 12 mg/dL (ref 8–23)
CO2: 25 mmol/L (ref 22–32)
Calcium: 9.5 mg/dL (ref 8.9–10.3)
Chloride: 105 mmol/L (ref 98–111)
Creatinine, Ser: 0.89 mg/dL (ref 0.44–1.00)
GFR calc Af Amer: >60 mL/min (ref >60)
GFR calc non Af Amer: >60 mL/min (ref >60)
Glucose, Bld: 190 mg/dL — ABNORMAL HIGH (ref 70–99)
Potassium: 4.2 mmol/L (ref 3.5–5.1)
Sodium: 137 mmol/L (ref 135–145)
Total Bilirubin: 0.7 mg/dL (ref 0.3–1.2)
Total Protein: 6.8 g/dL (ref 6.5–8.1)

## 2020-01-15 LAB — RESPIRATORY PANEL BY RT PCR (FLU A&B, COVID)
Influenza A by PCR: NEGATIVE
Influenza B by PCR: NEGATIVE
SARS Coronavirus 2 by RT PCR: NEGATIVE

## 2020-01-15 LAB — LIPASE, BLOOD: Lipase: 24 U/L (ref 11–51)

## 2020-01-15 LAB — HEMOGLOBIN A1C
Hgb A1c MFr Bld: 8.4 % — ABNORMAL HIGH (ref 4.8–5.6)
Mean Plasma Glucose: 194.38 mg/dL

## 2020-01-15 LAB — TROPONIN I (HIGH SENSITIVITY): Troponin I (High Sensitivity): 3 ng/L (ref <18)

## 2020-01-15 MED ORDER — NEOMYCIN-POLYMYXIN-DEXAMETH 3.5-10000-0.1 OP SUSP
1.0000 [drp] | Freq: Every day | OPHTHALMIC | Status: DC
  Administered 2020-01-15 – 2020-01-16 (×2): 1 [drp] via OPHTHALMIC
  Filled 2020-01-15: qty 5

## 2020-01-15 MED ORDER — ONDANSETRON HCL 4 MG/2ML IJ SOLN
4.0000 mg | Freq: Once | INTRAMUSCULAR | Status: AC
  Administered 2020-01-15: 4 mg via INTRAVENOUS
  Filled 2020-01-15: qty 2

## 2020-01-15 MED ORDER — DIPHENHYDRAMINE HCL 50 MG/ML IJ SOLN
25.0000 mg | Freq: Once | INTRAMUSCULAR | Status: AC
  Administered 2020-01-15: 25 mg via INTRAVENOUS

## 2020-01-15 MED ORDER — MORPHINE SULFATE (PF) 4 MG/ML IV SOLN
4.0000 mg | Freq: Once | INTRAVENOUS | Status: AC
  Administered 2020-01-15: 4 mg via INTRAVENOUS
  Filled 2020-01-15: qty 1

## 2020-01-15 MED ORDER — CLOPIDOGREL BISULFATE 75 MG PO TABS
75.0000 mg | ORAL_TABLET | Freq: Every day | ORAL | Status: DC
  Administered 2020-01-16: 75 mg via ORAL
  Filled 2020-01-15 (×2): qty 1

## 2020-01-15 MED ORDER — LACTATED RINGERS IV BOLUS
1000.0000 mL | Freq: Once | INTRAVENOUS | Status: AC
  Administered 2020-01-15: 1000 mL via INTRAVENOUS

## 2020-01-15 MED ORDER — NITROGLYCERIN 0.4 MG SL SUBL: 0.4000 mg | SUBLINGUAL_TABLET | SUBLINGUAL | Status: DC | PRN

## 2020-01-15 MED ORDER — LEVOTHYROXINE SODIUM 50 MCG PO TABS
75.0000 ug | ORAL_TABLET | Freq: Every day | ORAL | Status: DC
  Administered 2020-01-16: 75 ug via ORAL
  Filled 2020-01-15: qty 2

## 2020-01-15 MED ORDER — ENOXAPARIN SODIUM 40 MG/0.4ML ~~LOC~~ SOLN
40.0000 mg | SUBCUTANEOUS | Status: DC
  Administered 2020-01-15 – 2020-01-16 (×2): 40 mg via SUBCUTANEOUS
  Filled 2020-01-15: qty 0.4

## 2020-01-15 MED ORDER — INSULIN ASPART 100 UNIT/ML ~~LOC~~ SOLN
0.0000 [IU] | SUBCUTANEOUS | Status: DC
  Administered 2020-01-16: 3 [IU] via SUBCUTANEOUS
  Administered 2020-01-16: 2 [IU] via SUBCUTANEOUS
  Filled 2020-01-15 (×2): qty 1

## 2020-01-15 MED ORDER — BENZOCAINE 10 % MT GEL
1.0000 "application " | Freq: Four times a day (QID) | OROMUCOSAL | Status: DC | PRN
  Administered 2020-01-15: 1 via OROMUCOSAL
  Filled 2020-01-15 (×2): qty 9

## 2020-01-15 MED ORDER — DIPHENHYDRAMINE HCL 50 MG/ML IJ SOLN
INTRAMUSCULAR | Status: AC
  Filled 2020-01-15: qty 1

## 2020-01-15 MED ORDER — OXYMETAZOLINE HCL 0.05 % NA SOLN
1.0000 | Freq: Once | NASAL | Status: AC
  Administered 2020-01-15: 1 via NASAL
  Filled 2020-01-15: qty 30

## 2020-01-15 MED ORDER — LACTATED RINGERS IV SOLN: INTRAVENOUS | Status: DC

## 2020-01-15 MED ORDER — DIATRIZOATE MEGLUMINE & SODIUM 66-10 % PO SOLN
90.0000 mL | Freq: Once | ORAL | Status: AC
  Administered 2020-01-15: 90 mL via NASOGASTRIC

## 2020-01-15 MED ORDER — METOPROLOL TARTRATE 50 MG PO TABS
50.0000 mg | ORAL_TABLET | Freq: Two times a day (BID) | ORAL | Status: DC
  Administered 2020-01-15 – 2020-01-16 (×2): 50 mg via ORAL
  Filled 2020-01-15 (×3): qty 1

## 2020-01-15 NOTE — ED Notes (Signed)
ED TO INPATIENT HANDOFF REPORT  ED Nurse Name and Phone #: [place] Levels Name/Age/Gender [name] 79 y.o. female Room/Bed: ED33A/ED33A  Code Status   Code Status: Full Code  Home/SNF/Other Home Patient oriented to: self, place, time and situation Is this baseline? Yes   Triage Complete: Triage complete  Chief Complaint Partial small bowel obstruction ([place]) [K56.600] Small bowel obstruction ([place]) [id number]  Triage Note Pt presents to ED from home via EMS with c/o generalized cramping abd pain since around 1pm today. Worse with standing. +nausea +headache    Allergies Allergies  Allergen Reactions  . Iodinated Diagnostic Agents Other (See Comments)    Itching (severe) and chest tightness  . Penicillins Anaphylaxis, Swelling, Rash and Other (See Comments)    Has patient had a PCN reaction causing immediate rash, facial/tongue/throat swelling, SOB or lightheadedness with hypotension: Yes Has patient had a PCN reaction causing severe rash involving mucus membranes or skin necrosis: Unknown Has patient had a PCN reaction that required hospitalization: Unknown Has patient had a PCN reaction occurring within the last 10 years: No If all of the above answers are "NO", then may proceed with Cephalosporin use.   . Amlodipine Swelling    Pedal edema  . Anectine [Succinylcholine] Other (See Comments)    Brother went into cardiac arrest.  . Codeine Nausea Only  . Gabapentin Other (See Comments)    Gait abnormality  . Influenza Vaccines Other (See Comments)    Muscle weakness; unable to walk  . Nortriptyline Other (See Comments)    Eye swelling and mouth drawed up  . Pamelor [Nortriptyline Hcl] Other (See Comments)    Patient states caused her face to draw in together.  . Valsartan Other (See Comments) and Cough    Allergy to generic only, "Hacking" cough  . Erythromycin Rash and Swelling  . Sulfa Antibiotics Rash    Level of Care/Admitting Diagnosis ED Disposition     ED Disposition Condition [facility] Area: [place] [[id number]]  Level of Care: Med-Surg [16]  Covid Evaluation: Asymptomatic Screening Protocol (No Symptoms)  [facility]: [id number]  Admitting Physician: Acquanetta Sit  Attending Physician: Deatra James [phone]  Estimated length of stay: 3 - 4 days  Certification:: I certify this patient will need inpatient services for at least 2 midnights       B Medical/Surgery History Past Medical History:  Diagnosis Date  . Allergy   . ANA positive    positive ANA pattern 1 speckled  . Arthritis   . Carotid stenosis, asymptomatic 06/19/2015   123456 RICA 123456 LICA rpt 1 yr ([id number])   . Dermatomyositis ([place])   . Diabetes mellitus without complication (HCC)    Type 1  . Family history of adverse reaction to anesthesia    brothr went into cardiac arrest from anectine  . Fibromyalgia    prior PCP  . GERD (gastroesophageal reflux disease)    prior PCP  . Glaucoma    Narrow angle  . History of blood clots    DVT, in 20s, none since  . History of chicken pox   . History of diverticulitis   . History of pericarditis 1986   with hospitalization  . History of pneumonia 2014  . History of shingles   . History of UTI   . Hyperlipidemia   . Hypertension   . Hypothyroidism   . Mixed connective tissue disease ([place])   . PONV (  postoperative nausea and vomiting)   . Raynaud's disease without gangrene   . Shoulder pain left   h/o RTC tendonitis and adhesive capsulitis  . Sjogren's syndrome ([place])   . Sleep apnea    prior PCP - no CPAP for about 10 yrs  . 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[place])   . Vitamin D deficiency    prior PCP   Past Surgical History:  Procedure Laterality Date  . ABDOMINAL HYSTERECTOMY  1978   fibroids and menorrhagia, ovaries remain  . ARTERY BIOPSY Right 04/06/2018   Procedure: BIOPSY TEMPORAL ARTERY RIGHT;  Surgeon: Beverly Gust, MD;   Location: [facility];  Service: ENT;  Laterality: Right;  Diabetic - insulin pump sleep apnea  . [facility] normal per patient  . COLONOSCOPY  10/2011   1 TA, 1 HP, very tortuous colon ([place])  . COLONOSCOPY WITH ESOPHAGOGASTRODUODENOSCOPY (EGD)  03/2007   2 ulcers, benign polyp, rpt 5 yrs ([place], [place])  . JOINT REPLACEMENT Right    hip  . PARTIAL HIP ARTHROPLASTY  2013   Right hip replacement  . TONSILLECTOMY    . TONSILLECTOMY AND ADENOIDECTOMY    . TRANSCAROTID ARTERY REVASCULARIZATION Left 07/23/2018   Procedure: TRANSCAROTID ARTERY REVASCULARIZATION;  Surgeon: Marty Heck, MD;  Location: [facility];  Service: Vascular;  Laterality: Left;  . TUBAL LIGATION    . VAGINAL DELIVERY     x2, no complications     A IV Location/Drains/Wounds Patient Lines/Drains/Airways Status   Active Line/Drains/Airways    Name:   Placement date:   Placement time:   Site:   Days:   Peripheral IV 01/14/20 Right Antecubital   01/14/20    2335    Antecubital   1   Incision (Closed) 04/06/18 Face Right   04/06/18    1221     649   Incision (Closed) 07/23/18 Neck Left   07/23/18    0849     541   Incision (Closed) 07/23/18 Groin Right   07/23/18    0849     541          Intake/Output Last 24 hours No intake or output data in the 24 hours ending 01/15/20 1906  Labs/Imaging Results for orders placed or performed during the hospital encounter of 01/15/20 (from the past 48 hour(s))  Lipase, blood     Status: None   Collection Time: 01/14/20 11:35 PM  Result Value Ref Range   Lipase 24 11 - 51 U/L    Comment: Performed at [facility], [address] [place], [place] [address]  Comprehensive metabolic panel     Status: Abnormal   Collection Time: 01/14/20 11:35 PM  Result Value Ref Range   Sodium 137 135 - 145 mmol/L   Potassium 4.2 3.5 - 5.1 mmol/L   Chloride 105 98 - 111 mmol/L   CO2 25 22 - 32 mmol/L   Glucose, Bld 190  (H) 70 - 99 mg/dL    Comment: Glucose reference range applies only to samples taken after fasting for at least 8 hours.   BUN 12 8 - 23 mg/dL   Creatinine, Ser 0.89 0.44 - 1.00 mg/dL   Calcium 9.5 8.9 - 10.3 mg/dL   Total Protein 6.8 6.5 - 8.1 g/dL   Albumin 3.8 3.5 - 5.0 g/dL   AST 17 15 - 41 U/L   ALT 12 0 - 44 U/L   Alkaline Phosphatase 68 38 - 126 U/L  Total Bilirubin 0.7 0.3 - 1.2 mg/dL   GFR calc non Af Amer >60 >60 mL/min   GFR calc Af Amer >60 >60 mL/min   Anion gap 7 5 - 15    Comment: Performed at [facility], [address] [place], [place] [address]  CBC     Status: Abnormal   Collection Time: 01/14/20 11:35 PM  Result Value Ref Range   WBC 11.4 (H) 4.0 - 10.5 K/uL   RBC 5.08 3.87 - 5.11 MIL/uL   Hemoglobin 13.5 12.0 - 15.0 g/dL   HCT 41.7 36.0 - 46.0 %   MCV 82.1 80.0 - 100.0 fL   MCH 26.6 26.0 - 34.0 pg   MCHC 32.4 30.0 - 36.0 g/dL   RDW 14.3 11.5 - 15.5 %   Platelets 221 150 - 400 K/uL   nRBC 0.0 0.0 - 0.2 %    Comment: Performed at [facility], [address] [place], [place] [address]  Urinalysis, Complete w Microscopic     Status: Abnormal   Collection Time: 01/14/20 11:35 PM  Result Value Ref Range   Color, Urine STRAW (A) YELLOW   APPearance CLEAR (A) CLEAR   Specific Gravity, Urine 1.008 1.005 - 1.030   pH 6.0 5.0 - 8.0   Glucose, UA NEGATIVE NEGATIVE mg/dL   Hgb urine dipstick NEGATIVE NEGATIVE   Bilirubin Urine NEGATIVE NEGATIVE   Ketones, ur NEGATIVE NEGATIVE mg/dL   Protein, ur NEGATIVE NEGATIVE mg/dL   Nitrite NEGATIVE NEGATIVE   Leukocytes,Ua TRACE (A) NEGATIVE   RBC / HPF 0-5 0 - 5 RBC/hpf   WBC, UA 0-5 0 - 5 WBC/hpf   Bacteria, UA NONE SEEN NONE SEEN   Squamous Epithelial / LPF 0-5 0 - 5    Comment: Performed at [facility], [place], [place] [address]  Troponin I (High Sensitivity)     Status: None   Collection Time: 01/14/20 11:35 PM  Result Value Ref Range   Troponin I (High  Sensitivity) 3 <18 ng/L    Comment: (NOTE) Elevated high sensitivity troponin I (hsTnI) values and significant  changes across serial measurements may suggest ACS but many other  chronic and acute conditions are known to elevate hsTnI results.  Refer to the "Links" section for chest pain algorithms and additional  guidance. Performed at [facility], [address] [place], [place] [address]   Respiratory Panel by RT PCR (Flu A&B, Covid) - Nasopharyngeal Swab     Status: None   Collection Time: 01/15/20  5:01 AM   Specimen: Nasopharyngeal Swab  Result Value Ref Range   SARS Coronavirus 2 by RT PCR NEGATIVE NEGATIVE    Comment: (NOTE) SARS-CoV-2 target nucleic acids are NOT DETECTED. The SARS-CoV-2 RNA is generally detectable in upper respiratoy specimens during the acute phase of infection. The lowest concentration of SARS-CoV-2 viral copies this assay can detect is 131 copies/mL. A negative result does not preclude SARS-Cov-2 infection and should not be used as the sole basis for treatment or other patient management decisions. A negative result may occur with  improper specimen collection/handling, submission of specimen other than nasopharyngeal swab, presence of viral mutation(s) within the areas targeted by this assay, and inadequate number of viral copies (<131 copies/mL). A negative result must be combined with clinical observations, patient history, and epidemiological information. The expected result is Negative. Fact Sheet for Patients:  PinkCheek.be Fact Sheet for Healthcare Providers:  GravelBags.it This test is not yet ap proved  or cleared by the [place] and  has been authorized for detection and/or diagnosis of SARS-CoV-2 by FDA under an Emergency Use Authorization (EUA). This EUA will remain  in effect (meaning this test can be used) for the duration of the COVID-19 declaration under  Section 564(b)(1) of the Act, 21 U.S.C. section 360bbb-3(b)(1), unless the authorization is terminated or revoked sooner.    Influenza A by PCR NEGATIVE NEGATIVE   Influenza B by PCR NEGATIVE NEGATIVE    Comment: (NOTE) The Xpert Xpress SARS-CoV-2/FLU/RSV assay is intended as an aid in  the diagnosis of influenza from Nasopharyngeal swab specimens and  should not be used as a sole basis for treatment. Nasal washings and  aspirates are unacceptable for Xpert Xpress SARS-CoV-2/FLU/RSV  testing. Fact Sheet for Patients: PinkCheek.be Fact Sheet for Healthcare Providers: GravelBags.it This test is not yet approved or cleared by the [place] FDA and  has been authorized for detection and/or diagnosis of SARS-CoV-2 by  FDA under an Emergency Use Authorization (EUA). This EUA will remain  in effect (meaning this test can be used) for the duration of the  Covid-19 declaration under Section 564(b)(1) of the Act, 21  U.S.C. section 360bbb-3(b)(1), unless the authorization is  terminated or revoked. Performed at [facility], [address] [place], [place] [address]   Hemoglobin A1c     Status: Abnormal   Collection Time: 01/15/20  5:01 AM  Result Value Ref Range   Hgb A1c MFr Bld 8.4 (H) 4.8 - 5.6 %    Comment: (NOTE) Pre diabetes:          5.7%-6.4% Diabetes:              >6.4% Glycemic control for   <7.0% adults with diabetes    Mean Plasma Glucose 194.38 mg/dL    Comment: Performed at [facility] [address] [place] [address]  CBC     Status: None   Collection Time: 01/15/20  5:01 AM  Result Value Ref Range   WBC 10.2 4.0 - 10.5 K/uL   RBC 5.09 3.87 - 5.11 MIL/uL   Hemoglobin 13.6 12.0 - 15.0 g/dL   HCT 43.0 36.0 - 46.0 %   MCV 84.5 80.0 - 100.0 fL   MCH 26.7 26.0 - 34.0 pg   MCHC 31.6 30.0 - 36.0 g/dL   RDW 14.3 11.5 - 15.5 %   Platelets 225 150 - 400 K/uL   nRBC 0.0 0.0 - 0.2 %     Comment: Performed at [facility], [address] [place], [place] [address]  Creatinine, serum     Status: None   Collection Time: 01/15/20  5:01 AM  Result Value Ref Range   Creatinine, Ser 0.86 0.44 - 1.00 mg/dL   GFR calc non Af Amer >60 >60 mL/min   GFR calc Af Amer >60 >60 mL/min    Comment: Performed at [facility], [address] [place], [place] [address]  Glucose, capillary     Status: Abnormal   Collection Time: 01/15/20  6:55 AM  Result Value Ref Range   Glucose-Capillary 179 (H) 70 - 99 mg/dL    Comment: Glucose reference range applies only to samples taken after fasting for at least 8 hours.  Glucose, capillary     Status: Abnormal   Collection Time: 01/15/20 10:52 AM  Result Value Ref Range   Glucose-Capillary 159 (H) 70 - 99 mg/dL    Comment: Glucose reference range  applies only to samples taken after fasting for at least 8 hours.   Comment 1 Notify RN   Glucose, capillary     Status: Abnormal   Collection Time: 01/15/20  4:51 PM  Result Value Ref Range   Glucose-Capillary 108 (H) 70 - 99 mg/dL    Comment: Glucose reference range applies only to samples taken after fasting for at least 8 hours.   CT ABDOMEN PELVIS WO CONTRAST  Result Date: 01/15/2020 CLINICAL DATA:  Generalized abdominal pain EXAM: CT ABDOMEN AND PELVIS WITHOUT CONTRAST TECHNIQUE: Multidetector CT imaging of the abdomen and pelvis was performed following the standard protocol without IV contrast. COMPARISON:  None. FINDINGS: Lower chest: Lung bases are clear. No effusions. Heart is normal size. Hepatobiliary: No focal hepatic abnormality. Gallbladder unremarkable. Pancreas: No focal abnormality or ductal dilatation. Spleen: No focal abnormality.  Normal size. Adrenals/Urinary Tract: No adrenal abnormality. No focal renal abnormality. No stones or hydronephrosis. Urinary bladder is unremarkable. Stomach/Bowel: Sigmoid diverticulosis. No active diverticulitis. Few scattered right  colonic diverticula. Appendix is normal. Diverticulum off the 3rd portion of the duodenum. Mildly prominent proximal small bowel loops. Distal small bowel loops are decompressed. No well-defined focal transition. Vascular/Lymphatic: Aortic atherosclerosis. No evidence of aneurysm or adenopathy. Reproductive: Prior hysterectomy.  No adnexal masses. Other: No free fluid or free air. Musculoskeletal: No acute bony abnormality. Prior right hip replacement. IMPRESSION: Mildly prominent upper abdominal small bowel loops with decompressed distal small bowel. No well-defined transition is seen, but cannot completely exclude early or low grade small bowel obstruction. Sigmoid diverticulosis. No active diverticulitis. Normal appendix. Electronically Signed   By: Rolm Baptise M.D.   On: 01/15/2020 01:54   DG Abdomen 1 View  Result Date: 01/15/2020 CLINICAL DATA:  NG tube placement. EXAM: ABDOMEN - 1 VIEW COMPARISON:  CT scan, same date. FINDINGS: The NG tube tip is in the body region of the stomach. Stable bowel gas pattern. No free air. IMPRESSION: NG tube tip is in the body region of the stomach. Electronically Signed   By: Marijo Sanes M.D.   On: 01/15/2020 05:49   DG Abd Portable 1V-Small Bowel Obstruction Protocol-initial, 8 hr delay  Result Date: 01/15/2020 CLINICAL DATA:  79 year old female with 8 hour delayed small bowel particle. EXAM: PORTABLE ABDOMEN - 1 VIEW COMPARISON:  CT abdomen pelvis dated 01/15/2020. FINDINGS: There is oral contrast throughout the colon. There is no bowel dilatation or evidence of obstruction. Enteric tube with tip in the proximal stomach. Several sigmoid diverticula noted. The soft tissues are unremarkable. Right hip arthroplasty. No acute osseous pathology. IMPRESSION: No evidence of bowel obstruction. Electronically Signed   By: Anner Crete M.D.   On: 01/15/2020 17:48    Pending Labs Unresulted Labs (From admission, onward)    Start     Ordered   01/22/20 0500   Creatinine, serum  (enoxaparin (LOVENOX)    CrCl >/= 30 ml/min)  Weekly,   STAT    Comments: while on enoxaparin therapy    01/15/20 0409   01/16/20 [id number]  Basic metabolic panel  Daily,   STAT     01/15/20 1144          Vitals/Pain Today's Vitals   01/15/20 0930 01/15/20 1100 01/15/20 1113 01/15/20 1706  BP: (!) 119/59   123/68  Pulse: 73 71  68  Resp: 15   18  Temp:   97.7 F (36.5 C) 97.8 F (36.6 C)  TempSrc:   Oral Oral  SpO2: 94% 90%  97%  Weight:      Height:      PainSc:        Isolation Precautions No active isolations  Medications Medications  lactated ringers infusion ( Intravenous New Bag/Given 01/15/20 1109)  insulin aspart (novoLOG) injection 0-15 Units (0 Units Subcutaneous Hold 01/15/20 1702)  enoxaparin (LOVENOX) injection 40 mg (40 mg Subcutaneous Given 01/15/20 0941)  metoprolol tartrate (LOPRESSOR) tablet 50 mg (0 mg Oral Hold 01/15/20 1250)  neomycin-polymyxin b-dexamethasone (MAXITROL) ophthalmic suspension 1 drop (1 drop Both Eyes Given 01/15/20 1115)  benzocaine (ORAJEL) 10 % mucosal gel 1 application (1 application Mouth/Throat Given 01/15/20 1109)  clopidogrel (PLAVIX) tablet 75 mg (0 mg Oral Hold 01/15/20 1250)  levothyroxine (SYNTHROID) tablet 75 mcg (0 mcg Oral Hold 01/15/20 0940)  nitroGLYCERIN (NITROSTAT) SL tablet 0.4 mg (has no administration in time range)  lactated ringers bolus 1,000 mL (0 mLs Intravenous Stopped 01/15/20 1032)  morphine 4 MG/ML injection 4 mg (4 mg Intravenous Given 01/15/20 0413)  ondansetron (ZOFRAN) injection 4 mg (4 mg Intravenous Given 01/15/20 0412)  oxymetazoline (AFRIN) 0.05 % nasal spray 1 spray (1 spray Each Nare Given 01/15/20 0427)  morphine 4 MG/ML injection 4 mg (4 mg Intravenous Given 01/15/20 0554)  diphenhydrAMINE (BENADRYL) injection 25 mg (25 mg Intravenous Given 01/15/20 0651)  diatrizoate meglumine-sodium (GASTROGRAFIN) 66-10 % solution 90 mL (90 mLs Per NG tube Given 01/15/20 0917)    Mobility walks with  person assist Low fall risk   Focused Assessments Pulmonary Assessment Handoff:  Lung sounds:   O2 Device: Room Air        R Recommendations: See Admitting Provider Note  Report given to:   Additional Notes:

## 2020-01-15 NOTE — Consult Note (Signed)
SURGICAL CONSULTATION NOTE   HISTORY OF PRESENT ILLNESS (HPI):  79 y.o. female presented to [facility] ED for evaluation of abdominal cramping. Patient reports having abdominal cramps in the lower abdomen since yesterday.  She endorses having some nausea but no vomiting.  Unable to report if the abdominal pain is radiating to other part of body just on the lower quadrants.  There has been no alleviating or aggravating factors.  Patient reports she has regular bowel movement yesterday's.  Today no bowel movement.  She cannot recall passing gas.  When asked about last time she passed gas she reported that she does not member passing gas in the last few years.  At the ED she had a CT scan of the abdomen pelvis that showed dilation of the small intestine.  This was concerning for small bowel obstruction.  Minimal leukocytosis up to 11,000.  Surgery is consulted by Dr. Damita Dunnings in this context for evaluation and management of small bowel obstruction.  PAST MEDICAL HISTORY (PMH):  Past Medical History:  Diagnosis Date  . Allergy   . ANA positive    positive ANA pattern 1 speckled  . Arthritis   . Carotid stenosis, asymptomatic 06/19/2015   9-02% RICA 40-97% LICA rpt 1 yr (11/5327)   . Dermatomyositis ([place])   . Diabetes mellitus without complication (HCC)    Type 1  . Family history of adverse reaction to anesthesia    brothr went into cardiac arrest from anectine  . Fibromyalgia    prior PCP  . GERD (gastroesophageal reflux disease)    prior PCP  . Glaucoma    Narrow angle  . History of blood clots    DVT, in 20s, none since  . History of chicken pox   . History of diverticulitis   . History of pericarditis 1986   with hospitalization  . History of pneumonia 2014  . History of shingles   . History of UTI   . Hyperlipidemia   . Hypertension   . Hypothyroidism   . Mixed connective tissue disease ([place])   . PONV (postoperative nausea and vomiting)   . Raynaud's disease without gangrene   .  Shoulder pain left   h/o RTC tendonitis and adhesive capsulitis  . Sjogren's syndrome ([place])   . Sleep apnea    prior PCP - no CPAP for about 10 yrs  . 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[place])   . Vitamin D deficiency    prior PCP     PAST SURGICAL HISTORY ([place]):  Past Surgical History:  Procedure Laterality Date  . ABDOMINAL HYSTERECTOMY  1978   fibroids and menorrhagia, ovaries remain  . ARTERY BIOPSY Right 04/06/2018   Procedure: BIOPSY TEMPORAL ARTERY RIGHT;  Surgeon: Beverly Gust, MD;  Location: [facility];  Service: ENT;  Laterality: Right;  Diabetic - insulin pump sleep apnea  . [facility] normal per patient  . COLONOSCOPY  10/2011   1 TA, 1 HP, very tortuous colon ([place])  . COLONOSCOPY WITH ESOPHAGOGASTRODUODENOSCOPY (EGD)  03/2007   2 ulcers, benign polyp, rpt 5 yrs ([place], [place])  . JOINT REPLACEMENT Right    hip  . PARTIAL HIP ARTHROPLASTY  2013   Right hip replacement  . TONSILLECTOMY    . TONSILLECTOMY AND ADENOIDECTOMY    . TRANSCAROTID ARTERY REVASCULARIZATION Left 07/23/2018   Procedure: TRANSCAROTID ARTERY REVASCULARIZATION;  Surgeon: Marty Heck, MD;  Location: [facility];  Service: Vascular;  Laterality: Left;  . TUBAL LIGATION    .  VAGINAL DELIVERY     x2, no complications     MEDICATIONS:  Prior to Admission medications   Medication Sig Start Date End Date Taking? Authorizing Provider  acetaminophen (TYLENOL) 500 MG tablet Take 1 tablet (500 mg total) by mouth 3 (three) times daily as needed. 01/03/20  Yes [name], MD  Ascorbic Acid (VITAMIN C) 100 MG tablet Take 100 mg by mouth daily.   Yes [provider]  aspirin EC 81 MG tablet Take 81 mg by mouth daily.   Yes [provider]  benzocaine (ORAJEL) 10 % mucosal gel Use as directed 1 application in the mouth or throat 4 (four) times daily as needed for mouth pain.   Yes [provider]  clopidogrel (PLAVIX) 75 MG tablet  Take 1 tablet (75 mg total) by mouth daily. 09/03/19  Yes Marty Heck, MD  diclofenac sodium (VOLTAREN) 1 % GEL Apply 1 application topically 3 (three) times daily. 01/05/18  Yes [name], MD  glucagon (GLUCAGON EMERGENCY) 1 MG injection Inject 1 mg into the muscle once as needed for up to 1 dose. 11/06/18  Yes Philemon Kingdom, MD  insulin aspart (NOVOLOG) 100 UNIT/ML FlexPen Inject 6-10 Units into the skin 3 (three) times daily with meals. 11/25/19  Yes Philemon Kingdom, MD  insulin aspart (NOVOLOG) 100 UNIT/ML injection Use up to 50 units a day in the insulin pump 12/10/19  Yes Philemon Kingdom, MD  Javier Docker Oil 500 MG CAPS Take 2 capsules (1,000 mg total) by mouth daily. Patient taking differently: Take 500 mg by mouth daily.  09/20/17  Yes [name], MD  lisinopril (ZESTRIL) 10 MG tablet TAKE 1 TABLET(10 MG) BY MOUTH DAILY 11/11/19  Yes [name], MD  memantine (NAMENDA) 5 MG tablet Take 1 tablet (5 mg total) by mouth daily for 7 days, THEN 1 tablet (5 mg total) 2 (two) times daily. 11/27/19 01/15/20 Yes [name], MD  metoprolol tartrate (LOPRESSOR) 50 MG tablet Take 50 mg by mouth 2 (two) times daily.   Yes [provider]  neomycin-polymyxin-dexamethasone (MAXITROL) 0.1 % ophthalmic suspension Place 1 drop into both eyes daily.   Yes [provider]  nitroGLYCERIN (NITROSTAT) 0.4 MG SL tablet Place 1 tablet (0.4 mg total) under the tongue every 5 (five) minutes as needed for chest pain. 07/31/18  Yes [name], MD  pantoprazole (PROTONIX) 40 MG tablet Take 1 tablet (40 mg total) by mouth daily as needed (GERD). 10/07/19  Yes [name], MD  PAPAYA PO Take 1 tablet by mouth daily as needed.    Yes [provider]  rosuvastatin (CRESTOR) 10 MG tablet Take 1 tablet (10 mg total) by mouth daily. 09/03/19  Yes Marty Heck, MD  SYNTHROID 75 MCG tablet TAKE 1 TABLET BY MOUTH ONCE DAILY FOR 6 DAYS AND 1 AND 1/2 TABLETS ON  THE 7TH DAY 09/13/19  Yes [name], MD  tiZANidine (ZANAFLEX) 2 MG tablet Take 1 tablet (2 mg total) by mouth 2 (two) times daily as needed for muscle spasms (sedation precautions). 01/03/20  Yes [name], MD  BD ULTRA-FINE LANCETS lancets Use as instructed upto 6 times daily 09/02/14   Phadke, Karsten Ro, MD  Continuous Blood Gluc Receiver (DEXCOM G6 RECEIVER) DEVI 1 kit by Does not apply route See admin instructions. Use to check blood sugar 4 times a day 09/18/19   Philemon Kingdom, MD  Continuous Blood Gluc Sensor (DEXCOM G6 SENSOR) MISC 1 each by Does not apply route See admin  instructions. Change sensor every 10 days 09/18/19   Philemon Kingdom, MD  Continuous Blood Gluc Transmit (DEXCOM G6 TRANSMITTER) MISC 1 kit by Does not apply route See admin instructions. Use to check blood sugar 09/18/19   Philemon Kingdom, MD  glucose blood (CONTOUR NEXT TEST) test strip Use as instructed to check 4 times daily 07/03/17   Philemon Kingdom, MD  Insulin Pen Needle 32G X 4 MM MISC Use 4-5x a day 11/06/18   Philemon Kingdom, MD     ALLERGIES:  Allergies  Allergen Reactions  . Iodinated Diagnostic Agents Other (See Comments)    Itching (severe) and chest tightness  . Penicillins Anaphylaxis, Swelling, Rash and Other (See Comments)    Has patient had a PCN reaction causing immediate rash, facial/tongue/throat swelling, SOB or lightheadedness with hypotension: Yes Has patient had a PCN reaction causing severe rash involving mucus membranes or skin necrosis: Unknown Has patient had a PCN reaction that required hospitalization: Unknown Has patient had a PCN reaction occurring within the last 10 years: No If all of the above answers are "NO", then may proceed with Cephalosporin use.   . Amlodipine Swelling    Pedal edema  . Anectine [Succinylcholine] Other (See Comments)    Brother went into cardiac arrest.  . Codeine Nausea Only  . Gabapentin Other (See Comments)    Gait abnormality   . Influenza Vaccines Other (See Comments)    Muscle weakness; unable to walk  . Nortriptyline Other (See Comments)    Eye swelling and mouth drawed up  . Pamelor [Nortriptyline Hcl] Other (See Comments)    Patient states caused her face to draw in together.  . Valsartan Other (See Comments) and Cough    Allergy to generic only, "Hacking" cough  . Erythromycin Rash and Swelling  . Sulfa Antibiotics Rash     SOCIAL HISTORY:  Social History   Socioeconomic History  . Marital status: Widowed    Spouse name: Not on file  . Number of children: Not on file  . Years of education: Not on file  . Highest education level: Not on file  Occupational History  . Not on file  Tobacco Use  . Smoking status: Never Smoker  . Smokeless tobacco: Never Used  Substance and Sexual Activity  . Alcohol use: No  . Drug use: No  . Sexual activity: Not on file  Other Topics Concern  . Not on file  Social History Narrative   Lives in [place] now. Recently moved from [place]. Widow - husband decreased 01/2016 of metastatic colon CA   No pets.   Mother of Mayelin Panos.   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 Difficulty of Paying Living Expenses:   Food Insecurity:   . Worried About Charity fundraiser in the Last Year:   . Arboriculturist in the Last Year:   Transportation Needs:   . Film/video editor (Medical):   Marland Kitchen Lack of Transportation (Non-Medical):   Physical Activity:   . Days of Exercise per Week:   . Minutes of Exercise per Session:   Stress:   . Feeling of Stress :   Social Connections:   . Frequency of Communication  with Friends and Family:   . Frequency of Social Gatherings with Friends and Family:   .  Attends Religious Services:   . Active Member of Clubs or Organizations:   . Attends Archivist Meetings:   Marland Kitchen Marital Status:   Intimate Partner Violence:   . Fear of Current or Ex-Partner:   . Emotionally Abused:   Marland Kitchen Physically Abused:   . Sexually Abused:       FAMILY HISTORY:  Family History  Problem Relation Age of Onset  . CAD Mother 44       MI, aortic valve issues  . COPD Mother   . Lupus Mother   . Graves' disease Mother   . Rheum arthritis Mother   . CAD Father 41       CABG x2, aortic valve replacement  . Stroke Sister   . Alcohol abuse Brother   . CAD Brother 46       MI  . Stroke Brother   . Seizures Son   . COPD Brother   . CAD Brother 32       stent  . Diabetes Brother   . Depression Grandchild   . Cancer Maternal Aunt        breast  . Breast cancer Maternal Aunt   . Diabetes Sister        deceased  . CAD Sister 44       stents  . Breast cancer Maternal Aunt      REVIEW OF SYSTEMS:  Constitutional: denies weight loss, fever, chills, or sweats  Eyes: denies any other vision changes, history of eye injury  ENT: denies sore throat, hearing problems  Respiratory: denies shortness of breath, wheezing  Cardiovascular: denies chest pain, palpitations  Gastrointestinal: abdominal pain, nausea.  Denies vomiting Genitourinary: denies burning with urination or urinary frequency Musculoskeletal: denies any other joint pains or cramps  Skin: denies any other rashes or skin discolorations  Neurological: denies any other headache, dizziness, weakness  Psychiatric: denies any other depression, anxiety   All other review of systems were negative   VITAL SIGNS:  Temp:  [97.7 F (36.5 C)-98.7 F (37.1 C)] 97.7 F (36.5 C) (04/21 1113) Pulse Rate:  [71-99] 71 (04/21 1100) Resp:  [13-27] 15 (04/21 0930) BP: (119-158)/(53-82) 119/59 (04/21 0930) SpO2:  [90 %-98 %] 90 % (04/21 1100) Weight:  [68.9 kg] 68.9 kg (04/20 2329)     Height: 5' 3.5" (161.3  cm) Weight: 68.9 kg BMI (Calculated): 26.5   INTAKE/OUTPUT:  This shift: No intake/output data recorded.  Last 2 shifts: '@IOLAST2SHIFTS' @   PHYSICAL EXAM:  Constitutional:  -- Normal body habitus  -- Awake, alert, and oriented x3  Eyes:  -- Pupils equally round and reactive to light  -- No scleral icterus  Ear, nose, and throat:  -- No jugular venous distension  Pulmonary:  -- No crackles  -- Equal breath sounds bilaterally -- Breathing non-labored at rest Cardiovascular:  -- S1, S2 present  -- No pericardial rubs Gastrointestinal:  -- Abdomen soft, mild tender, non-distended, no guarding or rebound tenderness -- No abdominal masses appreciated, pulsatile or otherwise  Musculoskeletal and Integumentary:  -- Wounds: None appreciated -- Extremities: B/L UE and LE FROM, hands and feet warm, no edema  Neurologic:  -- Motor function: intact and symmetric -- Sensation: intact and symmetric   Labs:  CBC Latest Ref Rng & Units 01/15/2020 01/14/2020 11/25/2019  WBC 4.0 - 10.5 K/uL 10.2 11.4(H) 10.5  Hemoglobin 12.0 - 15.0  g/dL 13.6 13.5 13.5  Hematocrit 36.0 - 46.0 % 43.0 41.7 41.0  Platelets 150 - 400 K/uL 225 221 210   CMP Latest Ref Rng & Units 01/15/2020 01/14/2020 11/25/2019  Glucose 70 - 99 mg/dL - 190(H) 542(HH)  BUN 8 - 23 mg/dL - 12 14  Creatinine 0.44 - 1.00 mg/dL 0.86 0.89 0.98  Sodium 135 - 145 mmol/L - 137 133(L)  Potassium 3.5 - 5.1 mmol/L - 4.2 4.2  Chloride 98 - 111 mmol/L - 105 102  CO2 22 - 32 mmol/L - 25 20(L)  Calcium 8.9 - 10.3 mg/dL - 9.5 9.0  Total Protein 6.5 - 8.1 g/dL - 6.8 -  Total Bilirubin 0.3 - 1.2 mg/dL - 0.7 -  Alkaline Phos 38 - 126 U/L - 68 -  AST 15 - 41 U/L - 17 -  ALT 0 - 44 U/L - 12 -     Imaging studies:  EXAM: CT ABDOMEN AND PELVIS WITHOUT CONTRAST  TECHNIQUE: Multidetector CT imaging of the abdomen and pelvis was performed following the standard protocol without IV contrast.  COMPARISON:  None.  FINDINGS: Lower chest: Lung  bases are clear. No effusions. Heart is normal size.  Hepatobiliary: No focal hepatic abnormality. Gallbladder unremarkable.  Pancreas: No focal abnormality or ductal dilatation.  Spleen: No focal abnormality.  Normal size.  Adrenals/Urinary Tract: No adrenal abnormality. No focal renal abnormality. No stones or hydronephrosis. Urinary bladder is unremarkable.  Stomach/Bowel: Sigmoid diverticulosis. No active diverticulitis. Few scattered right colonic diverticula. Appendix is normal. Diverticulum off the 3rd portion of the duodenum. Mildly prominent proximal small bowel loops. Distal small bowel loops are decompressed. No well-defined focal transition.  Vascular/Lymphatic: Aortic atherosclerosis. No evidence of aneurysm or adenopathy.  Reproductive: Prior hysterectomy.  No adnexal masses.  Other: No free fluid or free air.  Musculoskeletal: No acute bony abnormality. Prior right hip replacement.  IMPRESSION: Mildly prominent upper abdominal small bowel loops with decompressed distal small bowel. No well-defined transition is seen, but cannot completely exclude early or low grade small bowel obstruction.  Sigmoid diverticulosis. No active diverticulitis.  Normal appendix.   Electronically Signed   By: Rolm Baptise M.D.   On: 01/15/2020 01:54  Assessment/Plan:  79 y.o. female with small bowel obstruction, complicated by pertinent comorbidities including diabetes, hypertension.  Patient with CT scan finding concerning of early small bowel obstruction.  X-ray done after NGT shows no small bowel dilation.  Patient with cramping and nausea but no vomiting.  NGT was placed at the ED for the management of small bowel obstruction.  I agree with current management with conservative management with bowel rest, NGT, IV hydration, keeping electrolytes within normal limits.  We will cells normalized today.  I will order Gastrografin challenge for further evaluation of  small bowel follow-through.  I discussed with the patient this plan and she agreed to proceed.   Arnold Long, MD

## 2020-01-15 NOTE — H&P (Signed)
History and Physical    Cassandra Benson DTO:[id number] DOB: 11/26/1940 DOA: 01/15/2020  PCP: [name], MD   Patient coming from: home  I have personally briefly reviewed patient's old medical records in [place]  Chief Complaint: abdominal pain  HPI: Cassandra Benson is a 79 y.o. female with medical history significant for diabetes and hypertension who presents to the emergency room with a several hour history of abdominal cramping associated with nausea.  Pain is periumbilical, nonradiating moderate to severe intensity.  She denies fever, chills, cough, shortness of breath. ED Course: Vitals in the ER were within normal limits.  White cell count elevated at 11,000.  Chemistries unremarkable, lipase 24 urinalysis negative CT abdomen and pelvis shows possible ability to low-grade small bowel obstruction.  Hospitalist consulted for admission  Review of Systems: As per HPI otherwise 10 point review of systems negative.    Past Medical History:  Diagnosis Date  . Allergy   . ANA positive    positive ANA pattern 1 speckled  . Arthritis   . Carotid stenosis, asymptomatic 06/19/2015   9-83% RICA 38-25% LICA rpt 1 yr (0/5397)   . Dermatomyositis ([place])   . Diabetes mellitus without complication (HCC)    Type 1  . Family history of adverse reaction to anesthesia    brothr went into cardiac arrest from anectine  . Fibromyalgia    prior PCP  . GERD (gastroesophageal reflux disease)    prior PCP  . Glaucoma    Narrow angle  . History of blood clots    DVT, in 20s, none since  . History of chicken pox   . History of diverticulitis   . History of pericarditis 1986   with hospitalization  . History of pneumonia 2014  . History of shingles   . History of UTI   . Hyperlipidemia   . Hypertension   . Hypothyroidism   . Mixed connective tissue disease ([place])   . PONV (postoperative nausea and vomiting)   . Raynaud's disease without gangrene   . Shoulder pain left   h/o  RTC tendonitis and adhesive capsulitis  . Sjogren's syndrome ([place])   . Sleep apnea    prior PCP - no CPAP for about 10 yrs  . 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[place])   . Vitamin D deficiency    prior PCP    Past Surgical History:  Procedure Laterality Date  . ABDOMINAL HYSTERECTOMY  1978   fibroids and menorrhagia, ovaries remain  . ARTERY BIOPSY Right 04/06/2018   Procedure: BIOPSY TEMPORAL ARTERY RIGHT;  Surgeon: Beverly Gust, MD;  Location: [facility];  Service: ENT;  Laterality: Right;  Diabetic - insulin pump sleep apnea  . [facility] normal per patient  . COLONOSCOPY  10/2011   1 TA, 1 HP, very tortuous colon ([place])  . COLONOSCOPY WITH ESOPHAGOGASTRODUODENOSCOPY (EGD)  03/2007   2 ulcers, benign polyp, rpt 5 yrs ([place])  . JOINT REPLACEMENT Right    hip  . PARTIAL HIP ARTHROPLASTY  2013   Right hip replacement  . TONSILLECTOMY    . TONSILLECTOMY AND ADENOIDECTOMY    . TRANSCAROTID ARTERY REVASCULARIZATION Left 07/23/2018   Procedure: TRANSCAROTID ARTERY REVASCULARIZATION;  Surgeon: Marty Heck, MD;  Location: [facility];  Service: Vascular;  Laterality: Left;  . TUBAL LIGATION    . VAGINAL DELIVERY     x2, no complications     reports that she has never smoked. She  has never used smokeless tobacco. She reports that she does not drink alcohol or use drugs.  Allergies  Allergen Reactions  . Iodinated Diagnostic Agents Other (See Comments)    Itching (severe) and chest tightness  . Penicillins Anaphylaxis, Swelling, Rash and Other (See Comments)    Has patient had a PCN reaction causing immediate rash, facial/tongue/throat swelling, SOB or lightheadedness with hypotension: Yes Has patient had a PCN reaction causing severe rash involving mucus membranes or skin necrosis: Unknown Has patient had a PCN reaction that required hospitalization: Unknown Has patient had a PCN reaction occurring within the last 10 years:  No If all of the above answers are "NO", then may proceed with Cephalosporin use.   . Amlodipine Swelling    Pedal edema  . Anectine [Succinylcholine] Other (See Comments)    Brother went into cardiac arrest.  . Codeine Nausea Only  . Gabapentin Other (See Comments)    Gait abnormality  . Influenza Vaccines Other (See Comments)    Muscle weakness; unable to walk  . Nortriptyline Other (See Comments)    Eye swelling and mouth drawed up  . Pamelor [Nortriptyline Hcl] Other (See Comments)    Patient states caused her face to draw in together.  . Valsartan Other (See Comments) and Cough    Allergy to generic only, "Hacking" cough  . Erythromycin Rash and Swelling  . Sulfa Antibiotics Rash    Family History  Problem Relation Age of Onset  . CAD Mother 45       MI, aortic valve issues  . COPD Mother   . Lupus Mother   . Graves' disease Mother   . Rheum arthritis Mother   . CAD Father 27       CABG x2, aortic valve replacement  . Stroke Sister   . Alcohol abuse Brother   . CAD Brother 25       MI  . Stroke Brother   . Seizures Son   . COPD Brother   . CAD Brother 75       stent  . Diabetes Brother   . Depression Grandchild   . Cancer Maternal Aunt        breast  . Breast cancer Maternal Aunt   . Diabetes Sister        deceased  . CAD Sister 105       stents  . Breast cancer Maternal Aunt      Prior to Admission medications   Medication Sig Start Date End Date Taking? Authorizing Provider  acetaminophen (TYLENOL) 500 MG tablet Take 1 tablet (500 mg total) by mouth 3 (three) times daily as needed. 01/03/20   [name], MD  Ascorbic Acid (VITAMIN C) 100 MG tablet Take 100 mg by mouth daily.    [provider]  aspirin EC 81 MG tablet Take 81 mg by mouth daily.    [provider]  BD ULTRA-FINE LANCETS lancets Use as instructed upto 6 times daily 09/02/14   Phadke, Radhika P, MD  benzocaine (ORAJEL) 10 % mucosal gel Use as directed 1 application  in the mouth or throat 4 (four) times daily as needed for mouth pain.    [provider]  benzonatate (TESSALON) 100 MG capsule Take 1 capsule (100 mg total) by mouth 3 (three) times daily as needed for cough. 12/26/19   [name], MD  chlorpheniramine-HYDROcodone [facility] ER) 10-8 MG/5ML SUER Take 5 mLs by mouth at bedtime as needed for cough (sedation precautions). 12/24/19  [name], MD  clopidogrel (PLAVIX) 75 MG tablet Take 1 tablet (75 mg total) by mouth daily. 09/03/19   Marty Heck, MD  Continuous Blood Gluc Receiver (DEXCOM G6 RECEIVER) DEVI 1 kit by Does not apply route See admin instructions. Use to check blood sugar 4 times a day 09/18/19   Philemon Kingdom, MD  Continuous Blood Gluc Sensor (DEXCOM G6 SENSOR) MISC 1 each by Does not apply route See admin instructions. Change sensor every 10 days 09/18/19   Philemon Kingdom, MD  Continuous Blood Gluc Transmit (DEXCOM G6 TRANSMITTER) MISC 1 kit by Does not apply route See admin instructions. Use to check blood sugar 09/18/19   Philemon Kingdom, MD  diazepam (VALIUM) 5 MG tablet Take 0.5-1 tablets (2.5-5 mg total) by mouth every 12 (twelve) hours as needed for anxiety (MRI - use with driver, sedation precautions). Patient not taking: Reported on 01/13/2020 10/25/19   [name], MD  diclofenac sodium (VOLTAREN) 1 % GEL Apply 1 application topically 3 (three) times daily. 01/05/18   [name], MD  glucagon (GLUCAGON EMERGENCY) 1 MG injection Inject 1 mg into the muscle once as needed for up to 1 dose. 11/06/18   Philemon Kingdom, MD  glucose blood (CONTOUR NEXT TEST) test strip Use as instructed to check 4 times daily 07/03/17   Philemon Kingdom, MD  insulin aspart (NOVOLOG) 100 UNIT/ML FlexPen Inject 6-10 Units into the skin 3 (three) times daily with meals. 11/25/19   Philemon Kingdom, MD  insulin aspart (NOVOLOG) 100 UNIT/ML injection Use up to 50 units a day in the insulin pump  12/10/19   Philemon Kingdom, MD  Insulin Pen Needle 32G X 4 MM MISC Use 4-5x a day 11/06/18   Philemon Kingdom, MD  Javier Docker Oil 500 MG CAPS Take 2 capsules (1,000 mg total) by mouth daily. Patient taking differently: Take 500 mg by mouth daily.  09/20/17   [name], MD  lisinopril (ZESTRIL) 10 MG tablet TAKE 1 TABLET(10 MG) BY MOUTH DAILY 11/11/19   [name], MD  memantine (NAMENDA) 5 MG tablet Take 1 tablet (5 mg total) by mouth daily for 7 days, THEN 1 tablet (5 mg total) 2 (two) times daily. 11/27/19 01/13/20  [name], MD  metoprolol succinate (TOPROL-XL) 50 MG 24 hr tablet Take 1 tablet (50 mg total) by mouth daily. 12/11/19   [name], MD  neomycin-polymyxin-dexamethasone (MAXITROL) 0.1 % ophthalmic suspension Place 1 drop into both eyes daily.    [provider]  nitroGLYCERIN (NITROSTAT) 0.4 MG SL tablet Place 1 tablet (0.4 mg total) under the tongue every 5 (five) minutes as needed for chest pain. Patient not taking: Reported on 01/13/2020 07/31/18   [name], MD  pantoprazole (PROTONIX) 40 MG tablet Take 1 tablet (40 mg total) by mouth daily as needed (GERD). 10/07/19   [name], MD  PAPAYA PO Take by mouth.    [provider]  rosuvastatin (CRESTOR) 10 MG tablet Take 1 tablet (10 mg total) by mouth daily. 09/03/19   Marty Heck, MD  SYNTHROID 75 MCG tablet TAKE 1 TABLET BY MOUTH ONCE DAILY FOR 6 DAYS AND 1 AND 1/2 TABLETS ON THE 7TH DAY 09/13/19   [name], MD  tiZANidine (ZANAFLEX) 2 MG tablet Take 1 tablet (2 mg total) by mouth 2 (two) times daily as needed for muscle spasms (sedation precautions). 01/03/20   [name], MD    Physical Exam: Vitals:   01/14/20 2329 01/15/20 0322 01/15/20 0330 01/15/20 0400  BP:  (!) 155/75 (!) 158/82 (!) 145/70  Pulse:  98 95 95  Resp:  '13 20 14  ' Temp:      TempSrc:      SpO2:  98% 98% 97%  Weight: 68.9 kg     Height: 5' 3.5" (1.613 m)        Vitals:    01/14/20 2329 01/15/20 0322 01/15/20 0330 01/15/20 0400  BP:  (!) 155/75 (!) 158/82 (!) 145/70  Pulse:  98 95 95  Resp:  '13 20 14  ' Temp:      TempSrc:      SpO2:  98% 98% 97%  Weight: 68.9 kg     Height: 5' 3.5" (1.613 m)       Constitutional: Alert and awake, oriented x3, not in any acute distress. Eyes: PERLA, EOMI, irises appear normal, anicteric sclera,  ENMT: external ears and nose appear normal, normal hearing             Lips appears normal, oropharynx mucosa, tongue, posterior pharynx appear normal  Neck: neck appears normal, no masses, normal ROM, no thyromegaly, no JVD  CVS: S1-S2 clear, no murmur rubs or gallops,  , no carotid bruits, pedal pulses palpable, No LE edema Respiratory:  clear to auscultation bilaterally, no wheezing, rales or rhonchi. Respiratory effort normal. No accessory muscle use.  Abdomen: soft , tender in lower abdomen , nondistended, normal bowel sounds, no hepatosplenomegaly, no hernias Musculoskeletal: : no cyanosis, clubbing , no contractures or atrophy Neuro: Cranial nerves II-XII intact, sensation, reflexes normal, strength Psych: judgement and insight appear normal, stable mood and affect,  Skin: no rashes or lesions or ulcers, no induration or nodules   Labs on Admission: I have personally reviewed following labs and imaging studies  CBC: Recent Labs  Lab 01/14/20 2335  WBC 11.4*  HGB 13.5  HCT 41.7  MCV 82.1  PLT 166   Basic Metabolic Panel: Recent Labs  Lab 01/14/20 2335  NA 137  K 4.2  CL 105  CO2 25  GLUCOSE 190*  BUN 12  CREATININE 0.89  CALCIUM 9.5   GFR: Estimated Creatinine Clearance: 49.9 mL/min (by C-G formula based on SCr of 0.89 mg/dL). Liver Function Tests: Recent Labs  Lab 01/14/20 2335  AST 17  ALT 12  ALKPHOS 68  BILITOT 0.7  PROT 6.8  ALBUMIN 3.8   Recent Labs  Lab 01/14/20 2335  LIPASE 24   No results for input(s): AMMONIA in the last 168 hours. Coagulation Profile: No results for input(s):  INR, PROTIME in the last 168 hours. Cardiac Enzymes: No results for input(s): CKTOTAL, CKMB, CKMBINDEX, TROPONINI in the last 168 hours. BNP (last 3 results) No results for input(s): PROBNP in the last 8760 hours. HbA1C: No results for input(s): HGBA1C in the last 72 hours. CBG: No results for input(s): GLUCAP in the last 168 hours. Lipid Profile: No results for input(s): CHOL, HDL, LDLCALC, TRIG, CHOLHDL, LDLDIRECT in the last 72 hours. Thyroid Function Tests: No results for input(s): TSH, T4TOTAL, FREET4, T3FREE, THYROIDAB in the last 72 hours. Anemia Panel: No results for input(s): VITAMINB12, FOLATE, FERRITIN, TIBC, IRON, RETICCTPCT in the last 72 hours. Urine analysis:    Component Value Date/Time   COLORURINE STRAW (A) 01/14/2020 2335   APPEARANCEUR CLEAR (A) 01/14/2020 2335   LABSPEC 1.008 01/14/2020 2335   PHURINE 6.0 01/14/2020 [place] 01/14/2020 2335   HGBUR NEGATIVE 01/14/2020 2335   BILIRUBINUR NEGATIVE 01/14/2020 2335   BILIRUBINUR negative 10/07/2019  [place] 01/14/2020 2335   PROTEINUR NEGATIVE 01/14/2020 2335   UROBILINOGEN 0.2 10/07/2019 1033   NITRITE NEGATIVE 01/14/2020 2335   LEUKOCYTESUR TRACE (A) 01/14/2020 2335    Radiological Exams on Admission: CT ABDOMEN PELVIS WO CONTRAST  Result Date: 01/15/2020 CLINICAL DATA:  Generalized abdominal pain EXAM: CT ABDOMEN AND PELVIS WITHOUT CONTRAST TECHNIQUE: Multidetector CT imaging of the abdomen and pelvis was performed following the standard protocol without IV contrast. COMPARISON:  None. FINDINGS: Lower chest: Lung bases are clear. No effusions. Heart is normal size. Hepatobiliary: No focal hepatic abnormality. Gallbladder unremarkable. Pancreas: No focal abnormality or ductal dilatation. Spleen: No focal abnormality.  Normal size. Adrenals/Urinary Tract: No adrenal abnormality. No focal renal abnormality. No stones or hydronephrosis. Urinary bladder is unremarkable.  Stomach/Bowel: Sigmoid diverticulosis. No active diverticulitis. Few scattered right colonic diverticula. Appendix is normal. Diverticulum off the 3rd portion of the duodenum. Mildly prominent proximal small bowel loops. Distal small bowel loops are decompressed. No well-defined focal transition. Vascular/Lymphatic: Aortic atherosclerosis. No evidence of aneurysm or adenopathy. Reproductive: Prior hysterectomy.  No adnexal masses. Other: No free fluid or free air. Musculoskeletal: No acute bony abnormality. Prior right hip replacement. IMPRESSION: Mildly prominent upper abdominal small bowel loops with decompressed distal small bowel. No well-defined transition is seen, but cannot completely exclude early or low grade small bowel obstruction. Sigmoid diverticulosis. No active diverticulitis. Normal appendix. Electronically Signed   By: Rolm Baptise M.D.   On: 01/15/2020 01:54    EKG: Independently reviewed.   Assessment/Plan Principal Problem:   Partial small bowel obstruction (HCC) -NG tube -IV fluids -IV narcotics and IV antiemetics -Surgical consult     Essential hypertension -As needed IV antihypertensives    Type 2 diabetes mellitus without complication (HCC) -Sliding scale insulin coverage    DVT prophylaxis: Lovenox  Code Status: full code  Family Communication:  none  Disposition Plan: Back to previous home environment Consults called: none  Status:obs    Athena Masse MD [facility]     01/15/2020, 4:10 AM

## 2020-01-15 NOTE — ED Notes (Signed)
X-ray at bedside

## 2020-01-15 NOTE — ED Notes (Addendum)
Assumed care of patient aox4, vss.  Vss.  NG tube to wall suction. Patient c/o of pain to throat requesting something to eat. Patient advise that she is NPO due to blockage and NGT placement. GI at bedside to explain further plan of care. Patient verbalized understanding. Safety maintained. Awaiting bed status.

## 2020-01-15 NOTE — ED Notes (Signed)
Gastografin started. ngt clamped.

## 2020-01-15 NOTE — ED Notes (Signed)
Pt has insulin pump removed by pt upon arrival.

## 2020-01-15 NOTE — Progress Notes (Signed)
PROGRESS NOTE    Patient: Cassandra Benson                            PCP: [name], MD                    DOB: 04-23-1941            DOA: 01/15/2020 PO:[id number]             DOS: 01/15/2020, 11:37 AM   LOS: 0 days   Date of Service: The patient was seen and examined on 01/15/2020  Subjective:   The patient was seen and examined this morning, sitting up in the bed, NG tube in place. Stating that NG tube is very uncomfortable. Inquiring if she would need a colonoscopy since her husband recently died from GI cancer.  Brief Narrative:    Cassandra Benson is a 79 y.o. female  with PMH of DM 2 (uses insulin pump), HTN, presented with abdominal pain, associate with nausea vomiting.  Work-up revealed patient has a small bowel obstruction NG tube was inserted, patient kept n.p.o., IV fluids   Assessment & Plan:   Principal Problem:   Partial small bowel obstruction (HCC) Active Problems:   Essential hypertension   Type 2 diabetes mellitus without complication (HCC)  Acute partial small bowel obstruction -Continue NG tube to wall suction -We will continue developing fluid hydration -obviously patient will be kept n.p.o. -Surgery consulted appreciate input  Essential hypertension -Home medication reviewed will be resumed when tolerated -Monitoring closely -As needed IV hydralazine  Diabetes mellitus type 2 without complication -CBG q. 4-6 hours, with SSI -Patient uses insulin pump at home will be on hold       Nutritional status:   NPO         --------------------------------------------------------------------------------------------------------------------------  DVT prophylaxis: SCD/Compression stockings and Lovenox SQ Code Status:   Code Status: Full Code Family Communication: No family member present at bedside - The above findings and plan of care has been discussed with patient  in detail,  they expressed understanding and agreement of  above. -Advance care planning has been discussed... Patient wishes to remain as full code Agreeing to aggressive medical treatment including operation if needed  Admission status:   Status is: Observation  The patient remains OBS appropriate and will d/c before 2 midnights.  Dispo: The patient is from: Home              Anticipated d/c is to: Home              Anticipated d/c date is: 2 days              Patient currently is not medically stable to d/c.  Due to small bowel obstruction, n.p.o.,  needing IV fluids, needing close observation, needing evaluation by specialty        Consultants: Surgery  Procedures:     NG tube insertion  Antimicrobials:  Anti-infectives (From admission, onward)   None       Medication:  . clopidogrel  75 mg Oral Daily  . enoxaparin (LOVENOX) injection  40 mg Subcutaneous Q24H  . insulin aspart  0-15 Units Subcutaneous Q4H  . levothyroxine  75 mcg Oral Q0600  . metoprolol tartrate  50 mg Oral BID  . neomycin-polymyxin b-dexamethasone  1 drop Both Eyes Daily    benzocaine, nitroGLYCERIN   Objective:  Vitals:   01/15/20 0900 01/15/20 0930 01/15/20 1100 01/15/20 1113  BP: 129/60 (!) 119/59    Pulse: 77 73 71   Resp: 14 15    Temp:    97.7 F (36.5 C)  TempSrc:    Oral  SpO2: 95% 94% 90%   Weight:      Height:       No intake or output data in the 24 hours ending 01/15/20 1137 Filed Weights   01/14/20 2329  Weight: 68.9 kg     Examination:   Physical Exam  Constitution:  Alert, cooperative, no distress,  Appears calm and comfortable  Psychiatric: Normal and stable mood and affect, cognition intact,   HEENT: NG tube in place  - normocephalic, PERRL, otherwise with in Normal limits  Chest:Chest symmetric Cardio vascular:  S1/S2, RRR, No murmure, No Rubs or Gallops  pulmonary: Clear to auscultation bilaterally, respirations unlabored, negative wheezes / crackles Abdomen: Soft, diffuse tenderness, nondistended,  hypoactive bowel sounds  no organomegaly Muscular skeletal: Limited exam - in bed, able to move all 4 extremities, Normal strength,  Neuro: CNII-XII intact. , normal motor and sensation, reflexes intact  Extremities: No pitting edema lower extremities, +2 pulses  Skin: Dry, warm to touch, negative for any Rashes, No open wounds Wounds: per nursing documentation      LABs:  CBC Latest Ref Rng & Units 01/15/2020 01/14/2020 11/25/2019  WBC 4.0 - 10.5 K/uL 10.2 11.4(H) 10.5  Hemoglobin 12.0 - 15.0 g/dL 13.6 13.5 13.5  Hematocrit 36.0 - 46.0 % 43.0 41.7 41.0  Platelets 150 - 400 K/uL 225 221 210   CMP Latest Ref Rng & Units 01/15/2020 01/14/2020 11/25/2019  Glucose 70 - 99 mg/dL - 190(H) 542(HH)  BUN 8 - 23 mg/dL - 12 14  Creatinine 0.44 - 1.00 mg/dL 0.86 0.89 0.98  Sodium 135 - 145 mmol/L - 137 133(L)  Potassium 3.5 - 5.1 mmol/L - 4.2 4.2  Chloride 98 - 111 mmol/L - 105 102  CO2 22 - 32 mmol/L - 25 20(L)  Calcium 8.9 - 10.3 mg/dL - 9.5 9.0  Total Protein 6.5 - 8.1 g/dL - 6.8 -  Total Bilirubin 0.3 - 1.2 mg/dL - 0.7 -  Alkaline Phos 38 - 126 U/L - 68 -  AST 15 - 41 U/L - 17 -  ALT 0 - 44 U/L - 12 -        SIGNED: Deatra James, MD, FACP, FHM. [facility],  Pager [phone] ([email or web address] [email or web address])  If 7PM-7AM, please contact night-coverage [email or web address] [email or web address] [facility] 01/15/2020, 11:37 AM

## 2020-01-15 NOTE — ED Notes (Signed)
Pt has authorized Taji Obrochta (daughter in law) to received full updates on her condition. Her number is cell [phone], work [phone].

## 2020-01-15 NOTE — ED Provider Notes (Addendum)
[facility] Emergency Department Provider Note  ____________________________________________  Time seen: Approximately 3:32 AM  I have reviewed the triage vital signs and the nursing notes.   HISTORY  Chief Complaint Abdominal Pain   HPI Cassandra Benson is a 79 y.o. female with several medical problems listed below including type 1 diabetes, carotid stenosis on Plavix, hypertension, hyperlipidemia, hysterectomy who presents for evaluation of abdominal pain.  Patient reports the pain started around 1 PM.  She complains of dull severe diffuse abdominal pain with intermittent sharp component.  She had 3 bowel movements today which is normal for her.  Initially had some nausea but that has resolved.  She has not passed flatus since the pain started.  No vomiting, no prior history of SBO, no fever or chills, no chest pain or shortness of breath, no cough, no dysuria or hematuria.  Patient denies ever having similar pain.   Past Medical History:  Diagnosis Date  . Allergy   . ANA positive    positive ANA pattern 1 speckled  . Arthritis   . Carotid stenosis, asymptomatic 06/19/2015   2-22% RICA 97-98% LICA rpt 1 yr (05/2118)   . Dermatomyositis ([place])   . Diabetes mellitus without complication (HCC)    Type 1  . Family history of adverse reaction to anesthesia    brothr went into cardiac arrest from anectine  . Fibromyalgia    prior PCP  . GERD (gastroesophageal reflux disease)    prior PCP  . Glaucoma    Narrow angle  . History of blood clots    DVT, in 20s, none since  . History of chicken pox   . History of diverticulitis   . History of pericarditis 1986   with hospitalization  . History of pneumonia 2014  . History of shingles   . History of UTI   . Hyperlipidemia   . Hypertension   . Hypothyroidism   . Mixed connective tissue disease ([place])   . PONV (postoperative nausea and vomiting)   . Raynaud's disease without gangrene   . Shoulder pain  left   h/o RTC tendonitis and adhesive capsulitis  . Sjogren's syndrome ([place])   . Sleep apnea    prior PCP - no CPAP for about 10 yrs  . 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[place])   . Vitamin D deficiency    prior PCP    Patient Active Problem List   Diagnosis Date Noted  . Partial small bowel obstruction ([place]) 01/15/2020  . Type 2 diabetes mellitus without complication ([place]) 41/74/0814  . Epistaxis 01/13/2020  . Wasp sting 01/13/2020  . Cough 01/05/2020  . MCI (mild cognitive impairment) with memory loss 10/22/2019  . Right sided abdominal pain 10/07/2019  . Skin lesion 09/14/2019  . Renal insufficiency 09/14/2019  . Pain in right buttock 06/18/2019  . Chronic midline thoracic back pain 05/14/2019  . Sjogren's syndrome without extraglandular involvement ([place]) 02/26/2019  . Epigastric discomfort 12/17/2018  . PAD (peripheral artery disease) ([place]) 10/04/2018  . Raynaud disease 09/04/2018  . Amaurosis fugax 06/18/2018  . TIA (transient ischemic attack) 05/21/2018  . 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 Facial paresthesia 02/07/2018  . Pain and swelling of lower leg 12/25/2017  . Fibromyalgia   . 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 Intractable episodic headache 03/13/2017  . Numbness and tingling of both lower extremities 03/13/2017  . 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 Fatty liver 02/13/2017  . LADA (latent autoimmune diabetes in adults), managed  as type 1 ([place]) 10/03/2016  . Medicare annual wellness visit, subsequent 06/19/2015  . Health maintenance examination 06/19/2015  . Advanced care planning/counseling discussion 06/19/2015  . 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 Vitamin D deficiency   . Family history of premature CAD 06/02/2015  . Chest pain 05/26/2015  . 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 Hyperlipidemia associated with type 2 diabetes mellitus ([place]) 07/30/2014  . Essential hypertension 07/17/2014    . Hypothyroidism due to Hashimoto's thyroiditis 07/17/2014  . Adjustment disorder with mixed anxiety and depressed mood 07/17/2014  . Apnea, sleep 08/22/2012  . LBP (low back pain) 02/27/2012  . Positive ANA (antinuclear antibody) 01/06/2012    Past Surgical History:  Procedure Laterality Date  . ABDOMINAL HYSTERECTOMY  1978   fibroids and menorrhagia, ovaries remain  . ARTERY BIOPSY Right 04/06/2018   Procedure: BIOPSY TEMPORAL ARTERY RIGHT;  Surgeon: Beverly Gust, MD;  Location: [facility];  Service: ENT;  Laterality: Right;  Diabetic - insulin pump sleep apnea  . [facility] normal per patient  . COLONOSCOPY  10/2011   1 TA, 1 HP, very tortuous colon ([place])  . COLONOSCOPY WITH ESOPHAGOGASTRODUODENOSCOPY (EGD)  03/2007   2 ulcers, benign polyp, rpt 5 yrs ([place], [place])  . JOINT REPLACEMENT Right    hip  . PARTIAL HIP ARTHROPLASTY  2013   Right hip replacement  . TONSILLECTOMY    . TONSILLECTOMY AND ADENOIDECTOMY    . TRANSCAROTID ARTERY REVASCULARIZATION Left 07/23/2018   Procedure: TRANSCAROTID ARTERY REVASCULARIZATION;  Surgeon: Marty Heck, MD;  Location: [facility];  Service: Vascular;  Laterality: Left;  . TUBAL LIGATION    . VAGINAL DELIVERY     x2, no complications    Prior to Admission medications   Medication Sig Start Date End Date Taking? Authorizing Provider  acetaminophen (TYLENOL) 500 MG tablet Take 1 tablet (500 mg total) by mouth 3 (three) times daily as needed. 01/03/20   [name], MD  Ascorbic Acid (VITAMIN C) 100 MG tablet Take 100 mg by mouth daily.    [provider]  aspirin EC 81 MG tablet Take 81 mg by mouth daily.    [provider]  BD ULTRA-FINE LANCETS lancets Use as instructed upto 6 times daily 09/02/14   Phadke, Radhika P, MD  benzocaine (ORAJEL) 10 % mucosal gel Use as directed 1 application in the mouth or throat 4 (four) times daily as needed for mouth  pain.    [provider]  benzonatate (TESSALON) 100 MG capsule Take 1 capsule (100 mg total) by mouth 3 (three) times daily as needed for cough. 12/26/19   [name], MD  chlorpheniramine-HYDROcodone [facility]) 10-8 MG/5ML SUER Take 5 mLs by mouth at bedtime as needed for cough (sedation precautions). 12/24/19   [name], MD  clopidogrel (PLAVIX) 75 MG tablet Take 1 tablet (75 mg total) by mouth daily. 09/03/19   Marty Heck, MD  Continuous Blood Gluc Receiver (DEXCOM G6 RECEIVER) DEVI 1 kit by Does not apply route See admin instructions. Use to check blood sugar 4 times a day 09/18/19   Philemon Kingdom, MD  Continuous Blood Gluc Sensor (DEXCOM G6 SENSOR) MISC 1 each by Does not apply route See admin instructions. Change sensor every 10 days 09/18/19   Philemon Kingdom, MD  Continuous Blood Gluc Transmit (DEXCOM G6 TRANSMITTER) MISC 1 kit by Does not apply route See admin instructions. Use  to check blood sugar 09/18/19   Philemon Kingdom, MD  diazepam (VALIUM) 5 MG tablet Take 0.5-1 tablets (2.5-5 mg total) by mouth every 12 (twelve) hours as needed for anxiety (MRI - use with driver, sedation precautions). Patient not taking: Reported on 01/13/2020 10/25/19   [name], MD  diclofenac sodium (VOLTAREN) 1 % GEL Apply 1 application topically 3 (three) times daily. 01/05/18   [name], MD  glucagon (GLUCAGON EMERGENCY) 1 MG injection Inject 1 mg into the muscle once as needed for up to 1 dose. 11/06/18   Philemon Kingdom, MD  glucose blood (CONTOUR NEXT TEST) test strip Use as instructed to check 4 times daily 07/03/17   Philemon Kingdom, MD  insulin aspart (NOVOLOG) 100 UNIT/ML FlexPen Inject 6-10 Units into the skin 3 (three) times daily with meals. 11/25/19   Philemon Kingdom, MD  insulin aspart (NOVOLOG) 100 UNIT/ML injection Use up to 50 units a day in the insulin pump 12/10/19   Philemon Kingdom, MD  Insulin Pen Needle 32G X 4 MM MISC  Use 4-5x a day 11/06/18   Philemon Kingdom, MD  Javier Docker Oil 500 MG CAPS Take 2 capsules (1,000 mg total) by mouth daily. Patient taking differently: Take 500 mg by mouth daily.  09/20/17   [name], MD  lisinopril (ZESTRIL) 10 MG tablet TAKE 1 TABLET(10 MG) BY MOUTH DAILY 11/11/19   [name], MD  memantine (NAMENDA) 5 MG tablet Take 1 tablet (5 mg total) by mouth daily for 7 days, THEN 1 tablet (5 mg total) 2 (two) times daily. 11/27/19 01/13/20  [name], MD  metoprolol succinate (TOPROL-XL) 50 MG 24 hr tablet Take 1 tablet (50 mg total) by mouth daily. 12/11/19   [name], MD  neomycin-polymyxin-dexamethasone (MAXITROL) 0.1 % ophthalmic suspension Place 1 drop into both eyes daily.    [provider]  nitroGLYCERIN (NITROSTAT) 0.4 MG SL tablet Place 1 tablet (0.4 mg total) under the tongue every 5 (five) minutes as needed for chest pain. Patient not taking: Reported on 01/13/2020 07/31/18   [name], MD  pantoprazole (PROTONIX) 40 MG tablet Take 1 tablet (40 mg total) by mouth daily as needed (GERD). 10/07/19   [name], MD  PAPAYA PO Take by mouth.    [provider]  rosuvastatin (CRESTOR) 10 MG tablet Take 1 tablet (10 mg total) by mouth daily. 09/03/19   Marty Heck, MD  SYNTHROID 75 MCG tablet TAKE 1 TABLET BY MOUTH ONCE DAILY FOR 6 DAYS AND 1 AND 1/2 TABLETS ON THE 7TH DAY 09/13/19   [name], MD  tiZANidine (ZANAFLEX) 2 MG tablet Take 1 tablet (2 mg total) by mouth 2 (two) times daily as needed for muscle spasms (sedation precautions). 01/03/20   [name], MD    Allergies Iodinated diagnostic agents, Penicillins, Amlodipine, Anectine [succinylcholine], Codeine, Gabapentin, Influenza vaccines, Nortriptyline, Pamelor [nortriptyline hcl], Valsartan, Erythromycin, and Sulfa antibiotics  Family History  Problem Relation Age of Onset  . CAD Mother 60       MI, aortic valve issues  . COPD Mother   . Lupus  Mother   . Graves' disease Mother   . Rheum arthritis Mother   . CAD Father 57       CABG x2, aortic valve replacement  . Stroke Sister   . Alcohol abuse Brother   . CAD Brother 4       MI  . Stroke Brother   . Seizures Son   . COPD Brother   .  CAD Brother 38       stent  . Diabetes Brother   . Depression Grandchild   . Cancer Maternal Aunt        breast  . Breast cancer Maternal Aunt   . Diabetes Sister        deceased  . CAD Sister 58       stents  . Breast cancer Maternal Aunt     Social History Social History   Tobacco Use  . Smoking status: Never Smoker  . Smokeless tobacco: Never Used  Substance Use Topics  . Alcohol use: No  . Drug use: No    Review of Systems  Constitutional: Negative for fever. Eyes: Negative for visual changes. ENT: Negative for sore throat. Neck: No neck pain  Cardiovascular: Negative for chest pain. Respiratory: Negative for shortness of breath. Gastrointestinal: + abdominal pain and nausea. No vomiting or diarrhea. Genitourinary: Negative for dysuria. Musculoskeletal: Negative for back pain. Skin: Negative for rash. Neurological: Negative for headaches, weakness or numbness. Psych: No SI or HI  ____________________________________________   PHYSICAL EXAM:  VITAL SIGNS: ED Triage Vitals  Enc Vitals Group     BP 01/14/20 2328 (!) 125/53     Pulse Rate 01/14/20 2328 90     Resp 01/14/20 2328 18     Temp 01/14/20 2328 98.7 F (37.1 C)     Temp Source 01/14/20 2328 Oral     SpO2 01/14/20 2328 98 %     Weight 01/14/20 2329 152 lb (68.9 kg)     Height 01/14/20 2329 5' 3.5" (1.613 m)     Head Circumference --      Peak Flow --      Pain Score 01/14/20 2329 0     Pain Loc --      Pain Edu? --      Excl. in [place]? --     Constitutional: Alert and oriented. Well appearing and in no apparent distress. HEENT:      Head: Normocephalic and atraumatic.         Eyes: Conjunctivae are normal. Sclera is non-icteric.        Mouth/Throat: Mucous membranes are moist.       Neck: Supple with no signs of meningismus. Cardiovascular: Regular rate and rhythm. No murmurs, gallops, or rubs. 2+ symmetrical distal pulses are present in all extremities.  Respiratory: Normal respiratory effort. Lungs are clear to auscultation bilaterally. No wheezes, crackles, or rhonchi.  Gastrointestinal: Soft, diffusely tender to palpation, and non distended with positive bowel sounds. No rebound or guarding. Musculoskeletal: Nontender with normal range of motion in all extremities. No edema, cyanosis, or erythema of extremities. Neurologic: Normal speech and language. Face is symmetric. Moving all extremities. No gross focal neurologic deficits are appreciated. Skin: Skin is warm, dry and intact. No rash noted. Psychiatric: Mood and affect are normal. Speech and behavior are normal.  ____________________________________________   LABS (all labs ordered are listed, but only abnormal results are displayed)  Labs Reviewed  COMPREHENSIVE METABOLIC PANEL - Abnormal; Notable for the following components:      Result Value   Glucose, Bld 190 (*)    All other components within normal limits  CBC - Abnormal; Notable for the following components:   WBC 11.4 (*)    All other components within normal limits  URINALYSIS, COMPLETE (UACMP) WITH MICROSCOPIC - Abnormal; Notable for the following components:   Color, Urine STRAW (*)    APPearance CLEAR (*)    Leukocytes,Ua  TRACE (*)    All other components within normal limits  RESPIRATORY PANEL BY RT PCR (FLU A&B, COVID)  LIPASE, BLOOD  HEMOGLOBIN A1C  CBC  CREATININE, SERUM  TROPONIN I (HIGH SENSITIVITY)   ____________________________________________  EKG  ED ECG REPORT I, Rudene Re, the attending physician, personally viewed and interpreted this ECG.  Sinus rhythm, rate of 96, normal intervals, normal axis, no ST elevations or depressions, inferior and anterior Q waves.   Unchanged from prior. ____________________________________________  RADIOLOGY  I have personally reviewed the images performed during this visit and I agree with the Radiologist's read.   Interpretation by Radiologist:  CT ABDOMEN PELVIS WO CONTRAST  Result Date: 01/15/2020 CLINICAL DATA:  Generalized abdominal pain EXAM: CT ABDOMEN AND PELVIS WITHOUT CONTRAST TECHNIQUE: Multidetector CT imaging of the abdomen and pelvis was performed following the standard protocol without IV contrast. COMPARISON:  None. FINDINGS: Lower chest: Lung bases are clear. No effusions. Heart is normal size. Hepatobiliary: No focal hepatic abnormality. Gallbladder unremarkable. Pancreas: No focal abnormality or ductal dilatation. Spleen: No focal abnormality.  Normal size. Adrenals/Urinary Tract: No adrenal abnormality. No focal renal abnormality. No stones or hydronephrosis. Urinary bladder is unremarkable. Stomach/Bowel: Sigmoid diverticulosis. No active diverticulitis. Few scattered right colonic diverticula. Appendix is normal. Diverticulum off the 3rd portion of the duodenum. Mildly prominent proximal small bowel loops. Distal small bowel loops are decompressed. No well-defined focal transition. Vascular/Lymphatic: Aortic atherosclerosis. No evidence of aneurysm or adenopathy. Reproductive: Prior hysterectomy.  No adnexal masses. Other: No free fluid or free air. Musculoskeletal: No acute bony abnormality. Prior right hip replacement. IMPRESSION: Mildly prominent upper abdominal small bowel loops with decompressed distal small bowel. No well-defined transition is seen, but cannot completely exclude early or low grade small bowel obstruction. Sigmoid diverticulosis. No active diverticulitis. Normal appendix. Electronically Signed   By: Rolm Baptise M.D.   On: 01/15/2020 01:54     ____________________________________________   PROCEDURES  Procedure(s) performed:yes .1-3 Lead EKG Interpretation Performed by:  Rudene Re, MD Authorized by: Rudene Re, MD     Interpretation: normal     ECG rate:  90   ECG rate assessment: normal     Rhythm: sinus rhythm     Ectopy: none     Conduction: normal     Critical Care performed: yes  CRITICAL CARE Performed by: Rudene Re  ?  Total critical care time: 30 min  Critical care time was exclusive of separately billable procedures and treating other patients.  Critical care was necessary to treat or prevent imminent or life-threatening deterioration.  Critical care was time spent personally by me on the following activities: development of treatment plan with patient and/or surrogate as well as nursing, discussions with consultants, evaluation of patient's response to treatment, examination of patient, obtaining history from patient or surrogate, ordering and performing treatments and interventions, ordering and review of laboratory studies, ordering and review of radiographic studies, pulse oximetry and re-evaluation of patient's condition.  ____________________________________________   INITIAL IMPRESSION / ASSESSMENT AND PLAN / ED COURSE   79 y.o. female with several medical problems listed below including type 1 diabetes, carotid stenosis on Plavix, hypertension, hyperlipidemia, hysterectomy who presents for evaluation of abdominal pain and nausea.   Patient looks uncomfortable but in no obvious distress with normal vital signs, abdomen is nondistended with positive bowel sounds, soft with diffuse tenderness throughout.  CT was visualized and interpreted by me as possible low-grade small bowel obstruction which was confirmed by radiology.  Labs showing mild  hyperglycemia no evidence of DKA.  No significant electrolyte abnormalities, mild leukocytosis with white count of 11.4.  NG tube was placed and patient was discussed with Dr. Damita Dunnings who accepted patient to her service.  IV morphine, zofran and IVF given for symptoms  relief. Old medical records were reviewed.      _____________________________________________ Please note:  Patient was evaluated in Emergency Department today for the symptoms described in the history of present illness. Patient was evaluated in the context of the global COVID-19 pandemic, which necessitated consideration that the patient might be at risk for infection with the SARS-CoV-2 virus that causes COVID-19. Institutional protocols and algorithms that pertain to the evaluation of patients at risk for COVID-19 are in a state of rapid change based on information released by regulatory bodies including the CDC and federal and state organizations. These policies and algorithms were followed during the patient's care in the ED.  Some ED evaluations and interventions may be delayed as a result of limited staffing during the pandemic.   [place] Controlled Substance Database was reviewed by me. ____________________________________________   FINAL CLINICAL IMPRESSION(S) / ED DIAGNOSES   Final diagnoses:  SBO (small bowel obstruction) ([place])      NEW MEDICATIONS STARTED DURING THIS VISIT:  ED Discharge Orders    None       Note:  This document was prepared using Dragon voice recognition software and may include unintentional dictation errors.    Alfred Levins, [place], MD 01/15/20 [place], [place], MD 01/15/20 [phone]

## 2020-01-15 NOTE — Telephone Encounter (Signed)
Patient's son called. Patient wanted to make sure Dr.G knew she's at [facility] and she'll be having surgery for a blockage in her stomach. I let patient's son know that the hospital usually sends notification to her PCP.

## 2020-01-16 ENCOUNTER — Other Ambulatory Visit: Payer: Self-pay

## 2020-01-16 ENCOUNTER — Telehealth: Payer: Self-pay

## 2020-01-16 LAB — BASIC METABOLIC PANEL
Anion gap: 8 (ref 5–15)
BUN: 11 mg/dL (ref 8–23)
CO2: 27 mmol/L (ref 22–32)
Calcium: 9 mg/dL (ref 8.9–10.3)
Chloride: 106 mmol/L (ref 98–111)
Creatinine, Ser: 1.02 mg/dL — ABNORMAL HIGH (ref 0.44–1.00)
GFR calc Af Amer: >60 mL/min (ref >60)
GFR calc non Af Amer: 53 mL/min — ABNORMAL LOW (ref >60)
Glucose, Bld: 140 mg/dL — ABNORMAL HIGH (ref 70–99)
Potassium: 4.3 mmol/L (ref 3.5–5.1)
Sodium: 141 mmol/L (ref 135–145)

## 2020-01-16 LAB — GLUCOSE, CAPILLARY
Glucose-Capillary: 109 mg/dL — ABNORMAL HIGH (ref 70–99)
Glucose-Capillary: 136 mg/dL — ABNORMAL HIGH (ref 70–99)
Glucose-Capillary: 168 mg/dL — ABNORMAL HIGH (ref 70–99)
Glucose-Capillary: 93 mg/dL (ref 70–99)

## 2020-01-16 NOTE — Progress Notes (Signed)
Assumed care of patient this evening. Patient alert oriented and pleasant at this time. Patient denies acute pain at this time. Patient continues to be on a clear liquid diet and seems to be tolerating well at this time. Patient able to to ambulate in room however we have place BSC commode near the bed as she is having large loose stools and can not make it to the bathroom in time. Patient oriented to room will continue to monitor.

## 2020-01-16 NOTE — Progress Notes (Signed)
Advised  Of low BP by NT. Patient currently on fluids will continue to monitor.

## 2020-01-16 NOTE — Telephone Encounter (Signed)
Transition Care Management Follow-up Telephone Call  Date of discharge and from where: 01/16/2020, [facility]  How have you been since you were released from the hospital? Patient states that she is feeling much better since being home from the hospital.   Any questions or concerns? No   Items Reviewed:  Did the pt receive and understand the discharge instructions provided? Yes   Medications obtained and verified? Yes   Any new allergies since your discharge? No   Dietary orders reviewed? Yes  Do you have support at home? Yes   Functional Questionnaire: (I = Independent and D = Dependent) ADLs: I  Bathing/Dressing- I  Meal Prep- I  Eating- I  Maintaining continence- I  Transferring/Ambulation- I  Managing Meds- I  Follow up appointments reviewed:   PCP Hospital f/u appt confirmed? Yes  Scheduled to see Dr. Danise Mina on 01/21/2020 @ 8:30 am.  [place] [facility] f/u appt confirmed? N/A   Are transportation arrangements needed? No  If their condition worsens, is the pt aware to call PCP or go to the Emergency Dept.? Yes  Was the patient provided with contact information for the PCP's office or ED? Yes  Was to pt encouraged to call back with questions or concerns? Yes

## 2020-01-16 NOTE — Discharge Summary (Signed)
Physician Discharge Summary [facility]    Patient: Cassandra Benson                   Admit date: 01/15/2020   DOB: Feb 25, 1941             Discharge date:01/16/2020/11:43 AM GBT:[id number]                          PCP: [name], MD  Disposition: HOME   Recommendations for Outpatient Follow-up:   . Follow up: in 2 weeks  Discharge Condition: Stable   Code Status:   Code Status: Full Code  Diet recommendation: Diabetic diet   Discharge Diagnoses:    Principal Problem:   Partial small bowel obstruction ([place]) Active Problems:   Essential hypertension   Type 2 diabetes mellitus without complication ([place])   Small bowel obstruction ([place])   History of Present Illness/ Hospital Course Kathleen Argue Summary:   Alaena Strader Morrisis a 79 y.o.female with PMH of DM 2 (uses insulin pump), HTN, presented with abdominal pain, associate with nausea vomiting.  Work-up revealed patient has a small bowel obstruction NG tube was inserted, patient kept n.p.o., IV fluids. Subsequently patient spontaneously resolved her partial small bowel obstruction.  Has had multiple bowel movements, was started on clear liquid diets, NG tube was DC'd. Patient was seen and evaluated by general surgery cleared patient for discharge. Is to follow with a PCP on gastroenterologist for further evaluation of possible colonoscopy in near future  ------------------------------------------------------------------------------------------------------------------------ Acute partial small bowel obstruction -Continue NG tube to wall suction----was DC'd early this morning -Was having breakfast, reported multiple explosive bowel movements -Status post IV fluid hydration -Now tolerating p.o. well--- diet was advanced -Surgery has cleared the patient for discharge.  Essential hypertension -Stable resuming her p.o. home medication   Diabetes mellitus type 2 without complication -CBG q. 4-6 hours, with  SSI---while in hospital insulin regimen was on hold -Tolerating p.o. now, home regimen insulin will be resumed including her insulin pump    --------------------------------------------------------------------------------------------------------------------------   Code Status:   Code Status: Full Code Family Communication: No family member present at bedside - The above findings and plan of care has been discussed with patient  in detail,  they expressed understanding and agreement of above. -Advance care planning has been discussed... Patient wishes to remain as full code Agreeing to aggressive medical treatment including operation if needed    Dispo: The patient is from: Home  Anticipated d/c is to: Home     Consultants: Surgery  Discharge Instructions:   Discharge Instructions    Activity as tolerated - No restrictions   Complete by: As directed    Call MD for:  persistant nausea and vomiting   Complete by: As directed    Diet Carb Modified   Complete by: As directed    Diet full liquid   Complete by: As directed    Discharge instructions   Complete by: As directed    Follow-up with your PCP and neurologist in next 4 to 6 weeks Continue to advance her slowly,  This times continue soft liquid diet...   Increase activity slowly   Complete by: As directed        Medication List    STOP taking these medications   tiZANidine 2 MG tablet Commonly known as: ZANAFLEX     TAKE these medications   aspirin EC 81 MG tablet Take 81 mg by mouth daily.  BD Ultra-Fine Lancets lancets Use as instructed upto 6 times daily   benzocaine 10 % mucosal gel Commonly known as: ORAJEL Use as directed 1 application in the mouth or throat 4 (four) times daily as needed for mouth pain.   [name] tablet Commonly known as: PLAVIX Take 1 tablet (75 mg total) by mouth daily.   Dexcom G6 Receiver Devi 1 kit by Does not apply route See admin  instructions. Use to check blood sugar 4 times a day   Dexcom G6 Sensor Misc 1 each by Does not apply route See admin instructions. Change sensor every 10 days   Dexcom G6 Transmitter Misc 1 kit by Does not apply route See admin instructions. Use to check blood sugar   diclofenac sodium 1 % Gel Commonly known as: VOLTAREN Apply 1 application topically 3 (three) times daily.   glucagon 1 MG injection Inject 1 mg into the muscle once as needed for up to 1 dose.   glucose blood test strip Commonly known as: Contour Next Test Use as instructed to check 4 times daily   insulin aspart 100 UNIT/ML FlexPen Commonly known as: NOVOLOG Inject 6-10 Units into the skin 3 (three) times daily with meals.   insulin aspart 100 UNIT/ML injection Commonly known as: novoLOG Use up to 50 units a day in the insulin pump   Insulin Pen Needle 32G X 4 MM Misc Use 4-5x a day   Krill Oil 500 MG Caps Take 2 capsules (1,000 mg total) by mouth daily. What changed: how much to take   lisinopril 10 MG tablet Commonly known as: ZESTRIL TAKE 1 TABLET(10 MG) BY MOUTH DAILY   memantine 5 MG tablet Commonly known as: Namenda Take 1 tablet (5 mg total) by mouth daily for 7 days, THEN 1 tablet (5 mg total) 2 (two) times daily. Start taking on: November 27, 2019   metoprolol tartrate 50 MG tablet Commonly known as: LOPRESSOR Take 50 mg by mouth 2 (two) times daily.   neomycin-polymyxin-dexamethasone 0.1 % ophthalmic suspension Commonly known as: MAXITROL Place 1 drop into both eyes daily.   nitroGLYCERIN 0.4 MG SL tablet Commonly known as: NITROSTAT Place 1 tablet (0.4 mg total) under the tongue every 5 (five) minutes as needed for chest pain.   pantoprazole 40 MG tablet Commonly known as: PROTONIX Take 1 tablet (40 mg total) by mouth daily as needed (GERD).   PAPAYA PO Take 1 tablet by mouth daily as needed.   rosuvastatin 10 MG tablet Commonly known as: CRESTOR Take 1 tablet (10 mg total) by  mouth daily.   Synthroid 75 MCG tablet Generic drug: levothyroxine TAKE 1 TABLET BY MOUTH ONCE DAILY FOR 6 DAYS AND 1 AND 1/2 TABLETS ON THE 7TH DAY   TYLENOL 500 MG tablet Generic drug: acetaminophen Take 1 tablet (500 mg total) by mouth 3 (three) times daily as needed.   vitamin C 100 MG tablet Take 100 mg by mouth daily.       Allergies  Allergen Reactions  . Iodinated Diagnostic Agents Other (See Comments)    Itching (severe) and chest tightness  . Penicillins Anaphylaxis, Swelling, Rash and Other (See Comments)    Has patient had a PCN reaction causing immediate rash, facial/tongue/throat swelling, SOB or lightheadedness with hypotension: Yes Has patient had a PCN reaction causing severe rash involving mucus membranes or skin necrosis: Unknown Has patient had a PCN reaction that required hospitalization: Unknown Has patient had a PCN reaction occurring within the last  10 years: No If all of the above answers are "NO", then may proceed with Cephalosporin use.   . Amlodipine Swelling    Pedal edema  . Anectine [Succinylcholine] Other (See Comments)    Brother went into cardiac arrest.  . Codeine Nausea Only  . Gabapentin Other (See Comments)    Gait abnormality  . Influenza Vaccines Other (See Comments)    Muscle weakness; unable to walk  . Nortriptyline Other (See Comments)    Eye swelling and mouth drawed up  . Pamelor [Nortriptyline Hcl] Other (See Comments)    Patient states caused her face to draw in together.  . Valsartan Other (See Comments) and Cough    Allergy to generic only, "Hacking" cough  . Erythromycin Rash and Swelling  . Sulfa Antibiotics Rash     Procedures /Studies:   CT ABDOMEN PELVIS WO CONTRAST  Result Date: 01/15/2020 CLINICAL DATA:  Generalized abdominal pain EXAM: CT ABDOMEN AND PELVIS WITHOUT CONTRAST TECHNIQUE: Multidetector CT imaging of the abdomen and pelvis was performed following the standard protocol without IV contrast.  COMPARISON:  None. FINDINGS: Lower chest: Lung bases are clear. No effusions. Heart is normal size. Hepatobiliary: No focal hepatic abnormality. Gallbladder unremarkable. Pancreas: No focal abnormality or ductal dilatation. Spleen: No focal abnormality.  Normal size. Adrenals/Urinary Tract: No adrenal abnormality. No focal renal abnormality. No stones or hydronephrosis. Urinary bladder is unremarkable. Stomach/Bowel: Sigmoid diverticulosis. No active diverticulitis. Few scattered right colonic diverticula. Appendix is normal. Diverticulum off the 3rd portion of the duodenum. Mildly prominent proximal small bowel loops. Distal small bowel loops are decompressed. No well-defined focal transition. Vascular/Lymphatic: Aortic atherosclerosis. No evidence of aneurysm or adenopathy. Reproductive: Prior hysterectomy.  No adnexal masses. Other: No free fluid or free air. Musculoskeletal: No acute bony abnormality. Prior right hip replacement. IMPRESSION: Mildly prominent upper abdominal small bowel loops with decompressed distal small bowel. No well-defined transition is seen, but cannot completely exclude early or low grade small bowel obstruction. Sigmoid diverticulosis. No active diverticulitis. Normal appendix. Electronically Signed   By: Rolm Baptise M.D.   On: 01/15/2020 01:54   DG Abdomen 1 View  Result Date: 01/15/2020 CLINICAL DATA:  NG tube placement. EXAM: ABDOMEN - 1 VIEW COMPARISON:  CT scan, same date. FINDINGS: The NG tube tip is in the body region of the stomach. Stable bowel gas pattern. No free air. IMPRESSION: NG tube tip is in the body region of the stomach. Electronically Signed   By: Marijo Sanes M.D.   On: 01/15/2020 05:49   DG Abd Portable 1V-Small Bowel Obstruction Protocol-initial, 8 hr delay  Result Date: 01/15/2020 CLINICAL DATA:  79 year old female with 8 hour delayed small bowel particle. EXAM: PORTABLE ABDOMEN - 1 VIEW COMPARISON:  CT abdomen pelvis dated 01/15/2020. FINDINGS: There is  oral contrast throughout the colon. There is no bowel dilatation or evidence of obstruction. Enteric tube with tip in the proximal stomach. Several sigmoid diverticula noted. The soft tissues are unremarkable. Right hip arthroplasty. No acute osseous pathology. IMPRESSION: No evidence of bowel obstruction. Electronically Signed   By: Anner Crete M.D.   On: 01/15/2020 17:48     Subjective:   Patient was seen and examined 01/16/2020, 11:43 AM Patient stable today. No acute distress.  No issues overnight Stable for discharge.  Discharge Exam:    Vitals:   01/15/20 2005 01/16/20 0018 01/16/20 0415 01/16/20 0957  BP: (!) 132/55 (!) 123/45 111/88 116/63  Pulse: 73 70 64 88  Resp: 20 16 14  Temp: 97.7 F (36.5 C) 98.3 F (36.8 C) 98 F (36.7 C) 98.1 F (36.7 C)  TempSrc: Oral Oral Oral Oral  SpO2: 99% 97% 96% 96%  Weight: 68.5 kg     Height: '5\' 4"'  (1.626 m)       General: Pt lying comfortably in bed & appears in no obvious distress. Cardiovascular: S1 & S2 heard, RRR, S1/S2 +. No murmurs, rubs, gallops or clicks. No JVD or pedal edema. Respiratory: Clear to auscultation without wheezing, rhonchi or crackles. No increased work of breathing. Abdominal:  Non-distended, non-tender & soft. No organomegaly or masses appreciated. Normal bowel sounds heard. CNS: Alert and oriented. No focal deficits. Extremities: no edema, no cyanosis    The results of significant diagnostics from this hospitalization (including imaging, microbiology, ancillary and laboratory) are listed below for reference.      Microbiology:   Recent Results (from the past 240 hour(s))  Respiratory Panel by RT PCR (Flu A&B, Covid) - Nasopharyngeal Swab     Status: None   Collection Time: 01/15/20  5:01 AM   Specimen: Nasopharyngeal Swab  Result Value Ref Range Status   SARS Coronavirus 2 by RT PCR NEGATIVE NEGATIVE Final    Comment: (NOTE) SARS-CoV-2 target nucleic acids are NOT DETECTED. The SARS-CoV-2  RNA is generally detectable in upper respiratoy specimens during the acute phase of infection. The lowest concentration of SARS-CoV-2 viral copies this assay can detect is 131 copies/mL. A negative result does not preclude SARS-Cov-2 infection and should not be used as the sole basis for treatment or other patient management decisions. A negative result may occur with  improper specimen collection/handling, submission of specimen other than nasopharyngeal swab, presence of viral mutation(s) within the areas targeted by this assay, and inadequate number of viral copies (<131 copies/mL). A negative result must be combined with clinical observations, patient history, and epidemiological information. The expected result is Negative. Fact Sheet for Patients:  PinkCheek.be Fact Sheet for Healthcare Providers:  GravelBags.it This test is not yet ap proved or cleared by the [place] FDA and  has been authorized for detection and/or diagnosis of SARS-CoV-2 by FDA under an Emergency Use Authorization (EUA). This EUA will remain  in effect (meaning this test can be used) for the duration of the COVID-19 declaration under Section 564(b)(1) of the Act, 21 U.S.C. section 360bbb-3(b)(1), unless the authorization is terminated or revoked sooner.    Influenza A by PCR NEGATIVE NEGATIVE Final   Influenza B by PCR NEGATIVE NEGATIVE Final    Comment: (NOTE) The Xpert Xpress SARS-CoV-2/FLU/RSV assay is intended as an aid in  the diagnosis of influenza from Nasopharyngeal swab specimens and  should not be used as a sole basis for treatment. Nasal washings and  aspirates are unacceptable for Xpert Xpress SARS-CoV-2/FLU/RSV  testing. Fact Sheet for Patients: PinkCheek.be Fact Sheet for Healthcare Providers: GravelBags.it This test is not yet approved or cleared by the [place] FDA  and  has been authorized for detection and/or diagnosis of SARS-CoV-2 by  FDA under an Emergency Use Authorization (EUA). This EUA will remain  in effect (meaning this test can be used) for the duration of the  Covid-19 declaration under Section 564(b)(1) of the Act, 21  U.S.C. section 360bbb-3(b)(1), unless the authorization is  terminated or revoked. Performed at [facility], [address] [place], [place] [address]      Labs:   CBC: Recent Labs  Lab 01/14/20 2335 01/15/20 0501  WBC 11.4* 10.2  HGB  13.5 13.6  HCT 41.7 43.0  MCV 82.1 84.5  PLT 221 809   Basic Metabolic Panel: Recent Labs  Lab 01/14/20 2335 01/15/20 0501 01/16/20 0436  NA 137  --  141  K 4.2  --  4.3  CL 105  --  106  CO2 25  --  27  GLUCOSE 190*  --  140*  BUN 12  --  11  CREATININE 0.89 0.86 1.02*  CALCIUM 9.5  --  9.0   Liver Function Tests: Recent Labs  Lab 01/14/20 2335  AST 17  ALT 12  ALKPHOS 68  BILITOT 0.7  PROT 6.8  ALBUMIN 3.8   BNP (last 3 results) No results for input(s): BNP in the last 8760 hours. Cardiac Enzymes: No results for input(s): CKTOTAL, CKMB, CKMBINDEX, TROPONINI in the last 168 hours. CBG: Recent Labs  Lab 01/15/20 1942 01/15/20 2012 01/16/20 0018 01/16/20 0417 01/16/20 0753  GLUCAP 106* 93 109* 136* 93   Hgb A1c Recent Labs    01/15/20 0501  HGBA1C 8.4*   Lipid Profile No results for input(s): CHOL, HDL, LDLCALC, TRIG, CHOLHDL, LDLDIRECT in the last 72 hours. Thyroid function studies No results for input(s): TSH, T4TOTAL, T3FREE, THYROIDAB in the last 72 hours.  Invalid input(s): FREET3 Anemia work up No results for input(s): VITAMINB12, FOLATE, FERRITIN, TIBC, IRON, RETICCTPCT in the last 72 hours. Urinalysis    Component Value Date/Time   COLORURINE STRAW (A) 01/14/2020 2335   APPEARANCEUR CLEAR (A) 01/14/2020 2335   LABSPEC 1.008 01/14/2020 2335   PHURINE 6.0 01/14/2020 [place] 01/14/2020 2335   HGBUR  NEGATIVE 01/14/2020 2335   BILIRUBINUR NEGATIVE 01/14/2020 2335   BILIRUBINUR negative 10/07/2019 [place] 01/14/2020 2335   PROTEINUR NEGATIVE 01/14/2020 2335   UROBILINOGEN 0.2 10/07/2019 1033   NITRITE NEGATIVE 01/14/2020 2335   LEUKOCYTESUR TRACE (A) 01/14/2020 2335         Time coordinating discharge: Over 45 minutes  SIGNED: Deatra James, MD, FACP, FHM. [facility],  Pager [phone]  If 7PM-7AM, please contact night-coverage [email or web address] 01/16/2020, 11:43 AM

## 2020-01-16 NOTE — Progress Notes (Signed)
Cassandra Benson  A and O x 4 VSS. Pt tolerating diet well. No complaints of pain or nausea. IV removed intact, prescriptions given. Pt voices understanding of discharge instructions with no further questions. Pt discharged via wheelchair with axillary.   Allergies as of 01/16/2020      Reactions   Iodinated Diagnostic Agents Other (See Comments)   Itching (severe) and chest tightness   Penicillins Anaphylaxis, Swelling, Rash, Other (See Comments)   Has patient had a PCN reaction causing immediate rash, facial/tongue/throat swelling, SOB or lightheadedness with hypotension: Yes Has patient had a PCN reaction causing severe rash involving mucus membranes or skin necrosis: Unknown Has patient had a PCN reaction that required hospitalization: Unknown Has patient had a PCN reaction occurring within the last 10 years: No If all of the above answers are "NO", then may proceed with Cephalosporin use.   Amlodipine Swelling   Pedal edema   Anectine [succinylcholine] Other (See Comments)   Brother went into cardiac arrest.   Codeine Nausea Only   Gabapentin Other (See Comments)   Gait abnormality   Influenza Vaccines Other (See Comments)   Muscle weakness; unable to walk   Nortriptyline Other (See Comments)   Eye swelling and mouth drawed up   Pamelor [nortriptyline Hcl] Other (See Comments)   Patient states caused her face to draw in together.   Valsartan Other (See Comments), Cough   Allergy to generic only, "Hacking" cough   Erythromycin Rash, Swelling   Sulfa Antibiotics Rash      Medication List    STOP taking these medications   tiZANidine 2 MG tablet Commonly known as: ZANAFLEX     TAKE these medications   aspirin EC 81 MG tablet Take 81 mg by mouth daily.   BD Ultra-Fine Lancets lancets Use as instructed upto 6 times daily   benzocaine 10 % mucosal gel Commonly known as: ORAJEL Use as directed 1 application in the mouth or throat 4 (four) times daily as needed for mouth  pain.   [name] tablet Commonly known as: PLAVIX Take 1 tablet (75 mg total) by mouth daily.   Dexcom G6 Receiver Devi 1 kit by Does not apply route See admin instructions. Use to check blood sugar 4 times a day   Dexcom G6 Sensor Misc 1 each by Does not apply route See admin instructions. Change sensor every 10 days   Dexcom G6 Transmitter Misc 1 kit by Does not apply route See admin instructions. Use to check blood sugar   diclofenac sodium 1 % Gel Commonly known as: VOLTAREN Apply 1 application topically 3 (three) times daily.   glucagon 1 MG injection Inject 1 mg into the muscle once as needed for up to 1 dose.   glucose blood test strip Commonly known as: Contour Next Test Use as instructed to check 4 times daily   insulin aspart 100 UNIT/ML FlexPen Commonly known as: NOVOLOG Inject 6-10 Units into the skin 3 (three) times daily with meals.   insulin aspart 100 UNIT/ML injection Commonly known as: novoLOG Use up to 50 units a day in the insulin pump   Insulin Pen Needle 32G X 4 MM Misc Use 4-5x a day   Krill Oil 500 MG Caps Take 2 capsules (1,000 mg total) by mouth daily. What changed: how much to take   lisinopril 10 MG tablet Commonly known as: ZESTRIL TAKE 1 TABLET(10 MG) BY MOUTH DAILY   memantine 5 MG tablet Commonly known as: Namenda Take  1 tablet (5 mg total) by mouth daily for 7 days, THEN 1 tablet (5 mg total) 2 (two) times daily. Start taking on: November 27, 2019   metoprolol tartrate 50 MG tablet Commonly known as: LOPRESSOR Take 50 mg by mouth 2 (two) times daily.   neomycin-polymyxin-dexamethasone 0.1 % ophthalmic suspension Commonly known as: MAXITROL Place 1 drop into both eyes daily.   nitroGLYCERIN 0.4 MG SL tablet Commonly known as: NITROSTAT Place 1 tablet (0.4 mg total) under the tongue every 5 (five) minutes as needed for chest pain.   pantoprazole 40 MG tablet Commonly known as: PROTONIX Take 1 tablet (40 mg total) by  mouth daily as needed (GERD).   PAPAYA PO Take 1 tablet by mouth daily as needed.   rosuvastatin 10 MG tablet Commonly known as: CRESTOR Take 1 tablet (10 mg total) by mouth daily.   Synthroid 75 MCG tablet Generic drug: levothyroxine TAKE 1 TABLET BY MOUTH ONCE DAILY FOR 6 DAYS AND 1 AND 1/2 TABLETS ON THE 7TH DAY   TYLENOL 500 MG tablet Generic drug: acetaminophen Take 1 tablet (500 mg total) by mouth 3 (three) times daily as needed.   vitamin C 100 MG tablet Take 100 mg by mouth daily.       Vitals:   01/16/20 0415 01/16/20 0957  BP: 111/88 116/63  Pulse: 64 88  Resp: 14   Temp: 98 F (36.7 C) 98.1 F (36.7 C)  SpO2: 96% 96%    Cassandra Benson Cassandra Benson

## 2020-01-16 NOTE — Telephone Encounter (Signed)
Noted. See recent mychart message.

## 2020-01-16 NOTE — Progress Notes (Signed)
[place] Hospital Day(s): 1.   Post op day(s):  Marland Kitchen   Interval History: Patient seen and examined, no acute events or new complaints overnight. Patient reports she had a large bowel movement last night.  It was described as soft.  This is affected after Gastrografin.  She denies abdominal cramping.  She denies nausea or vomiting.  She is tolerating clear liquids and full liquids.  Vital signs in last 24 hours: [min-max] current  Temp:  [97.7 F (36.5 C)-98.3 F (36.8 C)] 98.1 F (36.7 C) (04/22 0957) Pulse Rate:  [64-88] 88 (04/22 0957) Resp:  [14-20] 14 (04/22 0415) BP: (111-134)/(45-88) 116/63 (04/22 0957) SpO2:  [90 %-99 %] 96 % (04/22 0957) Weight:  [68.5 kg] 68.5 kg (04/21 2005)     Height: 5\' 4"  (162.6 cm) Weight: 68.5 kg BMI (Calculated): 25.91   Physical Exam:  Constitutional: alert, cooperative and no distress  Respiratory: breathing non-labored at rest  Cardiovascular: regular rate and sinus rhythm  Gastrointestinal: soft, non-tender, and non-distended  Labs:  CBC Latest Ref Rng & Units 01/15/2020 01/14/2020 11/25/2019  WBC 4.0 - 10.5 K/uL 10.2 11.4(H) 10.5  Hemoglobin 12.0 - 15.0 g/dL 13.6 13.5 13.5  Hematocrit 36.0 - 46.0 % 43.0 41.7 41.0  Platelets 150 - 400 K/uL 225 221 210   CMP Latest Ref Rng & Units 01/16/2020 01/15/2020 01/14/2020  Glucose 70 - 99 mg/dL 140(H) - 190(H)  BUN 8 - 23 mg/dL 11 - 12  Creatinine 0.44 - 1.00 mg/dL 1.02(H) 0.86 0.89  Sodium 135 - 145 mmol/L 141 - 137  Potassium 3.5 - 5.1 mmol/L 4.3 - 4.2  Chloride 98 - 111 mmol/L 106 - 105  CO2 22 - 32 mmol/L 27 - 25  Calcium 8.9 - 10.3 mg/dL 9.0 - 9.5  Total Protein 6.5 - 8.1 g/dL - - 6.8  Total Bilirubin 0.3 - 1.2 mg/dL - - 0.7  Alkaline Phos 38 - 126 U/L - - 68  AST 15 - 41 U/L - - 17  ALT 0 - 44 U/L - - 12    Imaging studies: No new pertinent imaging studies   Assessment/Plan:  79 y.o. female with suspected bowel obstruction, resolved.   Patient admitted as partial more  bowel obstruction.  These resolve in less than 24 hours.  I think that this might be just a mild enteritis that caused the cramping and the nausea and vomiting.  The dilation on the CT scan and no significant.  She is tolerating diet.  I advance her diet to soft diet.  If she tolerates she can be discharged later today.  She does not need outpatient follow-up with surgical service.  She can be followed by primary care physician.  We will continue to follow while she is still in the hospital.  Arnold Long, MD

## 2020-01-17 ENCOUNTER — Encounter: Payer: Self-pay | Admitting: Family Medicine

## 2020-01-20 ENCOUNTER — Ambulatory Visit: Payer: PPO | Admitting: Family

## 2020-01-21 ENCOUNTER — Encounter: Payer: Self-pay | Admitting: Family Medicine

## 2020-01-21 ENCOUNTER — Ambulatory Visit (INDEPENDENT_AMBULATORY_CARE_PROVIDER_SITE_OTHER): Payer: PPO | Admitting: Family Medicine

## 2020-01-21 ENCOUNTER — Other Ambulatory Visit: Payer: Self-pay

## 2020-01-21 ENCOUNTER — Encounter: Payer: Self-pay | Admitting: Family

## 2020-01-21 ENCOUNTER — Telehealth: Payer: Self-pay | Admitting: Internal Medicine

## 2020-01-21 VITALS — BP 120/66 | HR 89 | Temp 97.9°F | Ht 64.0 in | Wt 153.1 lb

## 2020-01-21 DIAGNOSIS — E139 Other specified diabetes mellitus without complications: Secondary | ICD-10-CM

## 2020-01-21 DIAGNOSIS — K56600 Partial intestinal obstruction, unspecified as to cause: Secondary | ICD-10-CM

## 2020-01-21 DIAGNOSIS — K5790 Diverticulosis of intestine, part unspecified, without perforation or abscess without bleeding: Secondary | ICD-10-CM | POA: Insufficient documentation

## 2020-01-21 NOTE — Patient Instructions (Addendum)
I'm glad you're doing better!  Continue to slowly advance diet as tolerated.  We will refer you to GI (Dr Hilarie Fredrickson) for an evaluation.  Let us know if not improving each day.

## 2020-01-21 NOTE — Assessment & Plan Note (Signed)
Reviewed recent hospitalization course, labs and imaging with patient. She seems to be improving each day. Encouraged continued advancement of diet as tolerated. Will refer to GI for further evaluation as well as discussion of pros/cons of updating colonoscopy - she would like to see Dr Hilarie Fredrickson who cared for her late husband before he passed of metastatic cancer.

## 2020-01-21 NOTE — Telephone Encounter (Signed)
Dr. Hilarie Fredrickson,  We received a referral from PCP for this patient for small bowel obstruction. Patient had colon in 2013 at [facility]. Op report is in Epic we also received path report will be sending it out to you for review.   Please advise on scheduling thank you.

## 2020-01-21 NOTE — Telephone Encounter (Signed)
Seen today, she states bowel movements are slowly improving.

## 2020-01-21 NOTE — Assessment & Plan Note (Signed)
Appreciate endo care.  

## 2020-01-21 NOTE — Assessment & Plan Note (Signed)
Noted on recent CT scan. Reviewed red flags to suggest diverticulitis infection and need to seek evaluation if symptoms develop.

## 2020-01-21 NOTE — Progress Notes (Signed)
This visit was conducted in person.  BP 120/66 (BP Location: Left Arm, Patient Position: Sitting, Cuff Size: Normal)   Pulse 89   Temp 97.9 F (36.6 C) (Temporal)   Ht '5\' 4"'  (1.626 m)   Wt 153 lb 2 oz (69.5 kg)   SpO2 99%   BMI 26.28 kg/m    CC: hosp f/u visit  Subjective:    Patient ID: Cassandra Benson, female    DOB: [birth year], 79 y.o.   MRN: [id number]  HPI: Cassandra Benson is a 79 y.o. female presenting on 01/21/2020 for Hospitalization Follow-up   Recent hospitalization with sudden onset severe abdominal pain associated with nausea, found to have partial small bowel obstruction treated with NG tube, NPO, IVF, fortunately followed by spontaneous resolution. Surgery monitored patient during hospital stay. Stayed nauseated but without vomiting.   Advised to stop zanaflex.  Continues protonix daily - this has helped cough.   Since home feeling better but not yet 100% - continued intermittent abdominal pains - "like a punch" to lower abdomen. Having regular bowel movements - looser than normal.   COLONOSCOPY 10/2011 - 1 TA, 1 HP, very tortuous colon ([facility]).   CT abd/pelvis without contrast 12/2019 IMPRESSION: Mildly prominent upper abdominal small bowel loops with decompressed distal small bowel. No well-defined transition is seen, but cannot completely exclude early or low grade small bowel obstruction. Sigmoid diverticulosis. No active diverticulitis. Normal appendix. Electronically Signed   By: Rolm Baptise M.D.   On: 01/15/2020 01:54   Admission date: 01/15/2020 Discharge date: 01/16/2020 TCM hosp f/u phone call completed 01/16/2020.   Discharge diagnosis: Principal Problem:   Partial small bowel obstruction (HCC) Active Problems:   Essential hypertension   Type 2 diabetes mellitus without complication (HCC)   Small bowel obstruction ([place])  Disposition: HOME   Recommendations for Outpatient Follow-up:   Follow up: in 2 weeks  Discharge  Condition: Stable   Code Status:   Code Status: Full Code  Diet recommendation: Diabetic diet     Relevant past medical, surgical, family and social history reviewed and updated as indicated. Interim medical history since our last visit reviewed. Allergies and medications reviewed and updated. Outpatient Medications Prior to Visit  Medication Sig Dispense Refill  . acetaminophen (TYLENOL) 500 MG tablet Take 1 tablet (500 mg total) by mouth 3 (three) times daily as needed.    . Ascorbic Acid (VITAMIN C) 100 MG tablet Take 100 mg by mouth daily.    Marland Kitchen aspirin EC 81 MG tablet Take 81 mg by mouth daily.    . BD ULTRA-FINE LANCETS lancets Use as instructed upto 6 times daily 600 each 2  . benzocaine (ORAJEL) 10 % mucosal gel Use as directed 1 application in the mouth or throat 4 (four) times daily as needed for mouth pain.    Marland Kitchen clopidogrel (PLAVIX) 75 MG tablet Take 1 tablet (75 mg total) by mouth daily. 30 tablet 6  . Continuous Blood Gluc Receiver (DEXCOM G6 RECEIVER) DEVI 1 kit by Does not apply route See admin instructions. Use to check blood sugar 4 times a day 1 each 0  . Continuous Blood Gluc Sensor (DEXCOM G6 SENSOR) MISC 1 each by Does not apply route See admin instructions. Change sensor every 10 days 3 each 3  . Continuous Blood Gluc Transmit (DEXCOM G6 TRANSMITTER) MISC 1 kit by Does not apply route See admin instructions. Use to check blood sugar 1 each 1  . diclofenac sodium (VOLTAREN) 1 %  GEL Apply 1 application topically 3 (three) times daily. 1 Tube 1  . glucagon (GLUCAGON EMERGENCY) 1 MG injection Inject 1 mg into the muscle once as needed for up to 1 dose. 1 each 12  . glucose blood (CONTOUR NEXT TEST) test strip Use as instructed to check 4 times daily 400 each 5  . insulin aspart (NOVOLOG) 100 UNIT/ML FlexPen Inject 6-10 Units into the skin 3 (three) times daily with meals. 15 mL 11  . insulin aspart (NOVOLOG) 100 UNIT/ML injection Use up to 50 units a day in the insulin pump  50 mL 3  . Insulin Pen Needle 32G X 4 MM MISC Use 4-5x a day 300 each 3  . Krill Oil 500 MG CAPS Take 2 capsules (1,000 mg total) by mouth daily. (Patient taking differently: Take 500 mg by mouth daily. )    . lisinopril (ZESTRIL) 10 MG tablet TAKE 1 TABLET(10 MG) BY MOUTH DAILY 90 tablet 0  . metoprolol tartrate (LOPRESSOR) 50 MG tablet Take 50 mg by mouth 2 (two) times daily.    Marland Kitchen neomycin-polymyxin-dexamethasone (MAXITROL) 0.1 % ophthalmic suspension Place 1 drop into both eyes daily.    . nitroGLYCERIN (NITROSTAT) 0.4 MG SL tablet Place 1 tablet (0.4 mg total) under the tongue every 5 (five) minutes as needed for chest pain. 30 tablet 1  . pantoprazole (PROTONIX) 40 MG tablet Take 1 tablet (40 mg total) by mouth daily as needed (GERD).    Marland Kitchen PAPAYA PO Take 1 tablet by mouth daily as needed.     . rosuvastatin (CRESTOR) 10 MG tablet Take 1 tablet (10 mg total) by mouth daily. 30 tablet 5  . SYNTHROID 75 MCG tablet TAKE 1 TABLET BY MOUTH ONCE DAILY FOR 6 DAYS AND 1 AND 1/2 TABLETS ON THE 7TH DAY 105 tablet 3  . memantine (NAMENDA) 5 MG tablet Take 1 tablet (5 mg total) by mouth daily for 7 days, THEN 1 tablet (5 mg total) 2 (two) times daily. 60 tablet 1   No facility-administered medications prior to visit.     Per HPI unless specifically indicated in ROS section below Review of Systems Objective:    BP 120/66 (BP Location: Left Arm, Patient Position: Sitting, Cuff Size: Normal)   Pulse 89   Temp 97.9 F (36.6 C) (Temporal)   Ht '5\' 4"'  (1.626 m)   Wt 153 lb 2 oz (69.5 kg)   SpO2 99%   BMI 26.28 kg/m   Wt Readings from Last 3 Encounters:  01/21/20 153 lb 2 oz (69.5 kg)  01/15/20 151 lb 0.2 oz (68.5 kg)  01/13/20 152 lb (68.9 kg)    Physical Exam Vitals and nursing note reviewed.  Constitutional:      Appearance: Normal appearance. She is not ill-appearing.  Cardiovascular:     Rate and Rhythm: Normal rate and regular rhythm.     Pulses: Normal pulses.     Heart sounds: Normal  heart sounds. No murmur.  Pulmonary:     Effort: Pulmonary effort is normal. No respiratory distress.     Breath sounds: Normal breath sounds. No wheezing, rhonchi or rales.  Abdominal:     General: Bowel sounds are normal. There is no distension.     Palpations: Abdomen is soft. There is no mass.     Tenderness: There is generalized abdominal tenderness (moderate to epigastric and supapubic, mild elsewhere). There is no guarding or rebound. Negative signs include Murphy's sign.     Hernia: No hernia is  present.  Musculoskeletal:     Right lower leg: No edema.     Left lower leg: No edema.  Skin:    Findings: No rash.  Neurological:     Mental Status: She is alert.  Psychiatric:        Mood and Affect: Mood normal.        Behavior: Behavior normal.       Assessment & Plan:  This visit occurred during the SARS-CoV-2 public health emergency.  Safety protocols were in place, including screening questions prior to the visit, additional usage of staff PPE, and extensive cleaning of exam room while observing appropriate contact time as indicated for disinfecting solutions.   Problem List Items Addressed This Visit    Partial small bowel obstruction ([place]) - Primary    Reviewed recent hospitalization course, labs and imaging with patient. She seems to be improving each day. Encouraged continued advancement of diet as tolerated. Will refer to GI for further evaluation as well as discussion of pros/cons of updating colonoscopy - she would like to see Dr Hilarie Fredrickson who cared for her late husband before he passed of metastatic cancer.       Relevant Orders   Ambulatory referral to Gastroenterology   LADA (latent autoimmune diabetes in adults), managed as type 1 ([place])    Appreciate endo care.       Diverticulosis    Noted on recent CT scan. Reviewed red flags to suggest diverticulitis infection and need to seek evaluation if symptoms develop.           No orders of the defined types were  placed in this encounter.  Orders Placed This Encounter  Procedures  . Ambulatory referral to Gastroenterology    Referral Priority:   Routine    Referral Type:   Consultation    Referral Reason:   Specialty Services Required    Number of Visits Requested:   1    Follow up plan: Return if symptoms worsen or fail to improve.  [name], MD

## 2020-01-23 ENCOUNTER — Encounter: Payer: Self-pay | Admitting: Physician Assistant

## 2020-01-24 ENCOUNTER — Other Ambulatory Visit: Payer: Self-pay

## 2020-01-24 ENCOUNTER — Ambulatory Visit: Payer: PPO | Admitting: Internal Medicine

## 2020-01-24 ENCOUNTER — Encounter: Payer: Self-pay | Admitting: Internal Medicine

## 2020-01-24 VITALS — BP 126/70 | HR 78 | Ht 64.0 in | Wt 155.0 lb

## 2020-01-24 DIAGNOSIS — E063 Autoimmune thyroiditis: Secondary | ICD-10-CM | POA: Diagnosis not present

## 2020-01-24 DIAGNOSIS — E782 Mixed hyperlipidemia: Secondary | ICD-10-CM

## 2020-01-24 DIAGNOSIS — E139 Other specified diabetes mellitus without complications: Secondary | ICD-10-CM | POA: Diagnosis not present

## 2020-01-24 DIAGNOSIS — E038 Other specified hypothyroidism: Secondary | ICD-10-CM

## 2020-01-24 MED ORDER — GLUCAGON (RDNA) 1 MG IJ KIT: 1.0000 mg | PACK | Freq: Once | INTRAMUSCULAR | 12 refills | Status: DC | PRN

## 2020-01-24 NOTE — Patient Instructions (Addendum)
Please use pump settings: - basal rates: 12 am: 0.800 units/h 3 am: 0.875 >> 0.900 9:30 am: 0.600 >> 0.650 5 pm: 1.300 6:30 pm: 1.200 11:30 pm: 1.500 >> 1.200 - ICR: 1:8 - target: 110 - ISF:  12 am: 1:20 8 pm: 1:30 - Active insulin time: 5h >> 4h   Please return in 3 months.

## 2020-01-24 NOTE — Progress Notes (Signed)
Patient ID: Cassandra Benson, female   DOB: 1941/06/17, 79 y.o.   MRN: [id number]   This visit occurred during the SARS-CoV-2 public health emergency.  Safety protocols were in place, including screening questions prior to the visit, additional usage of staff PPE, and extensive cleaning of exam room while observing appropriate contact time as indicated for disinfecting solutions.   HPI: Cassandra Benson is a 79 y.o.-year-old female, returning for f/u for LADA, initially dx'ed in 1998 (79 y/o), started insulin at dx, started insulin pump in ~2008, uncontrolled, without complications and hypothyroidism. She previously saw endocrinology at [facility] (Dr. Janese Banks) and Dr Howell Rucks. Last visit with me 1.5 months ago.  She was admitted 9 days ago (01/15/2020) with SBO.  She saw our nutritionist on 01/13/2020.  At that time, she was not wearing her CGM as she is still off the CGM now  Before last visit she was diagnosed with mild cognitive impairment and started on Namenda.  She continues to have a significant amount of pain all over her body due to her rheumatologic autoimmune diseases  Reviewed HbA1c levels: Lab Results  Component Value Date   HGBA1C 8.4 (H) 01/15/2020   HGBA1C 9.8 (A) 10/08/2019   HGBA1C 9.7 (H) 09/10/2019   She was previously on an insulin pump: - Medtronic 723-started 09/2016 (changed 07/2017), without CGM.  She uses NovoLog in the pump.  She was using [employer] for supplies before, but she is now forced by insurance to use [employer].  She came off the pump as she had a lot of problems getting supplies from them. - basal rates: 12 am: 0.800 units/h 3 am: 0.875 9:30 am: 0.100 >> 0.500 5 pm: 1.500 6:30 pm: 1.300 11:30 pm: 1.800  - ICR:   12 am: 10 (8 if on prednisone) 6 pm: 8 (6 if on Prednisone) - target: 130-130 except 6-8 am and 6 pm-12 am: 100-100 - ISF: 35 - Insulin on Board: 4h - bolus wizard: On Total daily dose: 30 units per day (up to 40 units for prescription) - extended  bolusing: Not using - changes infusion site: 5 to 6 days - Meter: Bayer Contour   At last visit, she was on: - Tresiba 18 >> 20-22 units in am - Novolog: ICR: 1:8 (starches)-1:10 (other foods), but mostly using carb equivalents Target: 130 Insulin sensitivity factor: 20 If you correct sugars at bedtime, only correct >200 and only give 2 to 3 units. She was previously using Glucophage ER d.a.w. but had to stop because this was not covered by her insurance.  She tried the generic metformin ER and this caused diarrhea so she had to stop.  Before last visit, she started back on an insulin pump: T:slim x2 + Dexcom G6 CGM (off the CGM now but plans to restart).   Pump settings: - basal rates: 12 am: 0.800 units/h 3 am: 0.875 9:30 am: 0.500 >> 0.700 >> 0.600 5 pm: 1.300 6:30 pm: 1.200 11:30 pm: 1.500 - ICR: 1:8 - target: 130 - ISF:  12 am: 1:20 8 pm: 1:30 - Active insulin time: 5h >> 4h >> 5h TDD from basal insulin: 51% (18 units) >> 52% (18 units) TDD from bolus insulin: 49% (17 units) >> 48% (17 units) TDD: 35 to 50 units >> 35-50 units  - extended bolusing: not using - changes infusion site: q3 days - Meter: Contour  She checks her sugars at least 4 times a day with her CGM.   Previously:   Lowest sugar was  43 >> ... 134 >> 61 >> 40 x 1 at night; hypoglycemia awareness in the 80s.  No history of hypoglycemia admissions.  She does have a glucagon kit at home. Highest sugar was 398 >> 500s (dehydration, afterwards: 339) >> 271; no history of DKA admissions.  Pt's meals are: - Breakfast: protein drink + almond milk - Lunch: PB jelly sandwich or sandwich with ham or cream cheese sandwich - Dinner: salads  - Snacks: pretzels; pork tenderloin + veggies; chicken + tenderloin No sodas.  She is walking 3 times a week, for 30 minutes.  No history of CKD, last BUN/creatinine:  Lab Results  Component Value Date   BUN 11 01/16/2020   BUN 12 01/14/2020   CREATININE 1.02 (H)  01/16/2020   CREATININE 0.86 01/15/2020  On lisinopril. + HL;  last set of lipids: Lab Results  Component Value Date   CHOL 212 (H) 09/10/2019   HDL 40.40 09/10/2019   LDLCALC 141 (H) 09/10/2019   LDLDIRECT 177.0 08/09/2016   TRIG 153.0 (H) 09/10/2019   CHOLHDL 5 09/10/2019  On Crestor 10-continues to have muscle cramps despite using coq.10.  She is also on Krill oil.  Praluent was not covered. - last eye exam was in 05/2019: No DR -She denies numbness and tingling in her feet.  She was admitted for CP + SOB 10/01/2017.  Cardiac events and PE were ruled out.  She is seeing cardiology (Dr. Rockey Situ).  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  She had right carotid ultrasound in 2020 and there is a 39% stenosis, significantly increased since 2018.  She has a history of left carotid endarterectomy.  Hypothyroidism: -Due to Hashimoto's thyroiditis -Positive family history of Graves' disease in her mother -She was initially on Levoxyl, but she thought Levoxyl increased her lipid levels and caused hair loss, so we changed to Synthroid.  She feels much better on the brand name.  She is currently on Synthroid d.a.w. 75 mcg 6 out of 7 days: - in am - fasting - at least 30 min from b'fast - no Ca, Fe,  PPIs, + multivitamins at night - not on Biotin   Review TFTs: Lab Results  Component Value Date   TSH 1.73 11/27/2019   TSH 0.12 (L) 10/07/2019   TSH 0.22 (L) 09/10/2019   TSH 1.10 11/06/2018   TSH 0.09 (L) 08/29/2018   TSH 0.92 11/27/2017   TSH 0.30 (L) 10/11/2017   TSH 0.24 (L) 08/11/2017   TSH 0.58 01/04/2017   TSH 0.42 08/09/2016   She also has history of SLE and Sjogren's syndrome.  She has generalized muscle aches and joint aches from these 2 conditions.  We checked her hormonal levels in the setting of hirsutism and acne and they were normal: Component     Latest Ref Rng & Units 09/02/2014 09/08/2014   Testosterone     3 - 41 ng/dL 59 (H)   Testosterone Free     0.0 - 4.2 pg/mL 2.1   FSH     mIU/mL  68.6  LH     mIU/mL  43.4  DHEA-SO4     7 - 177 ug/dL 49   Cortisol, Plasma     ug/dL  1.3 (Normal Dexamethasone suppression test)   Estradiol     pg/mL  24.0   Also, no signs of adrenal insufficiency: Component     Latest Ref Rng & Units 01/04/2017  Cortisol -  AM     mcg/dL 17.6  C206 ACTH     6 - 50 pg/mL 15   ROS: Constitutional: no weight gain/no weight loss, + fatigue, no subjective hyperthermia, no subjective hypothermia Eyes: no blurry vision, no xerophthalmia ENT: no sore throat, no nodules palpated in neck, no dysphagia, no odynophagia, no hoarseness Cardiovascular: no CP/no SOB/no palpitations/no leg swelling Respiratory: no cough/no SOB/no wheezing Gastrointestinal: no N/no V/no D/no C/no acid reflux, + AP Musculoskeletal: + Muscle aches/+ joint aches Skin: no rashes, no hair loss Neurological: no tremors/no numbness/no tingling/no dizziness  I reviewed pt's medications, allergies, PMH, social hx, family hx, and changes were documented in the history of present illness. Otherwise, unchanged from my initial visit note.  Past Medical History:  Diagnosis Date  . Allergy   . ANA positive    positive ANA pattern 1 speckled  . Arthritis   . Carotid stenosis, asymptomatic 06/19/2015   5-63% RICA 87-56% LICA rpt 1 yr (12/3327)   . Dermatomyositis ([place])   . Diabetes mellitus without complication (HCC)    Type 1  . Diverticulosis    sigmoid on CT scan 12/2019  . Family history of adverse reaction to anesthesia    brothr went into cardiac arrest from anectine  . Fibromyalgia    prior PCP  . GERD (gastroesophageal reflux disease)    prior PCP  . Glaucoma    Narrow angle  . History of blood clots    DVT, in 20s, none since  . History of chicken pox   . History of diverticulitis   . History of pericarditis 1986   with hospitalization  . History of pneumonia 2014   . History of shingles   . History of UTI   . Hyperlipidemia   . Hypertension   . Hypothyroidism   . Mixed connective tissue disease ([place])   . Partial small bowel obstruction ([place]) 12/2019   managed conservatively  . PONV (postoperative nausea and vomiting)   . Raynaud's disease without gangrene   . Shoulder pain left   h/o RTC tendonitis and adhesive capsulitis  . Sjogren's syndrome ([place])   . Sleep apnea    prior PCP - no CPAP for about 10 yrs  . 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[place])   . Vitamin D deficiency    prior PCP   Past Surgical History:  Procedure Laterality Date  . ABDOMINAL HYSTERECTOMY  1978   fibroids and menorrhagia, ovaries remain  . ARTERY BIOPSY Right 04/06/2018   Procedure: BIOPSY TEMPORAL ARTERY RIGHT;  Surgeon: Beverly Gust, MD;  Location: [facility];  Service: ENT;  Laterality: Right;  Diabetic - insulin pump sleep apnea  . [facility] normal per patient  . COLONOSCOPY  10/2011   1 TA, 1 HP, very tortuous colon ([place])  . COLONOSCOPY WITH ESOPHAGOGASTRODUODENOSCOPY (EGD)  03/2007   2 ulcers, benign polyp, rpt 5 yrs ([place], [place])  . JOINT REPLACEMENT Right    hip  . PARTIAL HIP ARTHROPLASTY  2013   Right hip replacement  . TONSILLECTOMY    . TONSILLECTOMY AND ADENOIDECTOMY    . TRANSCAROTID ARTERY REVASCULARIZATION Left 07/23/2018   Procedure: TRANSCAROTID ARTERY REVASCULARIZATION;  Surgeon: Marty Heck, MD;  Location: [facility];  Service: Vascular;  Laterality: Left;  . TUBAL LIGATION    . VAGINAL DELIVERY     x2, no complications   Social History   Social History  . Widowed   . Number of children: 2  Occupational History  Retired    Social History Main Topics  . Smoking status: Never Smoker  . Smokeless tobacco: Never Used  . Alcohol use No  . Drug use: No   Social History Narrative   Lives in [place] now. Recently moved from [place].   No pets.   Mother of Kinsey Karch.    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 acetaminophen (TYLENOL) 500 MG tablet Take 1 tablet (500 mg total) by mouth 3 (three) times daily as needed.    . Ascorbic Acid (VITAMIN C) 100 MG tablet Take 100 mg by mouth daily.    Marland Kitchen aspirin EC 81 MG tablet Take 81 mg by mouth daily.    . BD ULTRA-FINE LANCETS lancets Use as instructed upto 6 times daily 600 each 2  . benzocaine (ORAJEL) 10 % mucosal gel Use as directed 1 application in the mouth or throat 4 (four) times daily as needed for mouth pain.    Marland Kitchen clopidogrel (PLAVIX) 75 MG tablet Take 1 tablet (75 mg total) by mouth daily. 30 tablet 6  . Continuous Blood Gluc Receiver (DEXCOM G6 RECEIVER) DEVI 1 kit by Does not apply route See admin instructions. Use to check blood sugar 4 times a day 1 each 0  . Continuous Blood Gluc Sensor (DEXCOM G6 SENSOR) MISC 1 each by Does not apply route See admin instructions. Change sensor every 10 days 3 each 3  . Continuous Blood Gluc Transmit (DEXCOM G6 TRANSMITTER) MISC 1 kit by Does not apply route See admin instructions. Use to check blood sugar 1 each 1  . diclofenac sodium (VOLTAREN) 1 % GEL Apply 1 application topically 3 (three) times daily. 1 Tube 1  . glucagon (GLUCAGON EMERGENCY) 1 MG injection Inject 1 mg into the muscle once as needed for up to 1 dose. 1 each 12  . glucose blood (CONTOUR NEXT TEST) test strip Use as instructed to check 4 times daily 400 each 5  . insulin aspart (NOVOLOG) 100 UNIT/ML FlexPen Inject 6-10 Units into the skin 3 (three) times daily with meals. 15 mL 11  . insulin aspart (NOVOLOG) 100 UNIT/ML injection Use up to 50 units a day in the insulin pump 50 mL 3  . Insulin Pen Needle 32G X 4  MM MISC Use 4-5x a day 300 each 3  . Krill Oil 500 MG CAPS Take 2 capsules (1,000 mg total) by mouth daily. (Patient taking differently: Take 500 mg by mouth daily. )    . lisinopril (ZESTRIL) 10 MG tablet TAKE 1 TABLET(10 MG) BY MOUTH DAILY 90 tablet 0  . memantine (NAMENDA) 5 MG tablet Take 1 tablet (5 mg total) by mouth daily for 7 days, THEN 1 tablet (5 mg total) 2 (two) times daily. 60 tablet 1  . metoprolol tartrate (LOPRESSOR) 50 MG tablet Take 50 mg by mouth 2 (two) times daily.    Marland Kitchen neomycin-polymyxin-dexamethasone (MAXITROL) 0.1 % ophthalmic suspension Place 1 drop into both eyes daily.    . nitroGLYCERIN (NITROSTAT) 0.4 MG SL tablet Place 1 tablet (0.4 mg total) under the tongue every 5 (five) minutes as needed for chest pain. 30 tablet 1  . pantoprazole (PROTONIX)  40 MG tablet Take 1 tablet (40 mg total) by mouth daily as needed (GERD).    Marland Kitchen PAPAYA PO Take 1 tablet by mouth daily as needed.     . rosuvastatin (CRESTOR) 10 MG tablet Take 1 tablet (10 mg total) by mouth daily. 30 tablet 5  . SYNTHROID 75 MCG tablet TAKE 1 TABLET BY MOUTH ONCE DAILY FOR 6 DAYS AND 1 AND 1/2 TABLETS ON THE 7TH DAY 105 tablet 3   No current facility-administered medications on file prior to visit.   Allergies  Allergen Reactions  . Iodinated Diagnostic Agents Other (See Comments)    Itching (severe) and chest tightness  . Penicillins Anaphylaxis, Swelling, Rash and Other (See Comments)    Has patient had a PCN reaction causing immediate rash, facial/tongue/throat swelling, SOB or lightheadedness with hypotension: Yes Has patient had a PCN reaction causing severe rash involving mucus membranes or skin necrosis: Unknown Has patient had a PCN reaction that required hospitalization: Unknown Has patient had a PCN reaction occurring within the last 10 years: No If all of the above answers are "NO", then may proceed with Cephalosporin use.   . Amlodipine Swelling    Pedal edema  . Anectine  [Succinylcholine] Other (See Comments)    Brother went into cardiac arrest.  . Codeine Nausea Only  . Gabapentin Other (See Comments)    Gait abnormality  . Influenza Vaccines Other (See Comments)    Muscle weakness; unable to walk  . Nortriptyline Other (See Comments)    Eye swelling and mouth drawed up  . Pamelor [Nortriptyline Hcl] Other (See Comments)    Patient states caused her face to draw in together.  . Valsartan Other (See Comments) and Cough    Allergy to generic only, "Hacking" cough  . Erythromycin Rash and Swelling  . Sulfa Antibiotics Rash   Family History  Problem Relation Age of Onset  . CAD Mother 42       MI, aortic valve issues  . COPD Mother   . Lupus Mother   . Graves' disease Mother   . Rheum arthritis Mother   . CAD Father 5       CABG x2, aortic valve replacement  . Stroke Sister   . Alcohol abuse Brother   . CAD Brother 40       MI  . Stroke Brother   . Seizures Son   . COPD Brother   . CAD Brother 79       stent  . Diabetes Brother   . Depression Grandchild   . Cancer Maternal Aunt        breast  . Breast cancer Maternal Aunt   . Diabetes Sister        deceased  . CAD Sister 25       stents  . Breast cancer Maternal Aunt    PE: BP 126/70   Pulse 78   Ht '5\' 4"'$  (1.626 m)   Wt 155 lb (70.3 kg)   SpO2 98%   BMI 26.61 kg/m  Wt Readings from Last 3 Encounters:  01/24/20 155 lb (70.3 kg)  01/21/20 153 lb 2 oz (69.5 kg)  01/15/20 151 lb 0.2 oz (68.5 kg)   Constitutional: Normal weight, in NAD Eyes: PERRLA, EOMI, no exophthalmos ENT: moist mucous membranes, no thyromegaly, no cervical lymphadenopathy Cardiovascular: RRR, No MRG Respiratory: CTA B Gastrointestinal: abdomen soft, NT, ND, BS+ Musculoskeletal: no deformities, strength intact in all 4 Skin: moist, warm, no rashes Neurological: no tremor  with outstretched hands, DTR normal in all 4   ASSESSMENT: 1. DM1, uncontrolled, without long-term complications, but with  hyperglycemia  2.  Hashimoto's hypothyroidism  3. HL  PLAN:  1. Patient with longstanding, uncontrolled, not, previously on insulin pump then on basal-bolus insulin regimen due to problems with insurance, and now back on insulin pump before last visit.  She is on the t:slim X to insulin pump with Dexcom G6 CGM.  She came off the CGM earlier this month due to problems with her application on her phone.  She is back on it now. -At last visit, her sugars were the best I have seen for her in a long time.  She had some higher blood sugars in the 200s, but overall, they were not as fluctuating as before and she appears to be doing a good job checking her sugars before meals, entering them in the pump, and starting boluses before every meal.  However, she did have higher blood sugars in the late morning and early afternoon.  I advised her to increase basal rate for this period of time.  Also, to allow her more correction, I decreased her active insulin time from 5 hours to 4 hours.  The sugars after meals appeared to be appropriate so we did not change her insulin to carb ratios or sensitivity factors.  At last visit HbA1c was improved at 8.4%.  However, reviewing the glucose management indicator, the equivalent HbA1c for the 2 weeks prior to the appointment was approximately 7.4%.  Before last visit she was diagnosed with mild cognitive impairment, however, I did not recommend that she came off the pump since her sugars improved and she was doing a good job managing the pump. - latest HbA1c was better, at 8.4% this month -At this visit, she returns after recent hospitalization for small bowel obstruction.  Despite this, sugars remain better than before, with a pattern of slightly higher blood sugars in the morning which did not improve later in the day.  Therefore, I suggested an increase in basal rate from 3 AM onward. -Since she had occasional low blood sugars at night with the lowest being 40, I advised her  to decrease her basal rate from 11:30 PM to 12 AM, which was unusually high. -Also, since she did not have the recommended 4-hour active insulin time in the pump, but still using the 5-hour interval, I again advised her to change this. -I do not feel we need to make changes in her insulin to carb ratio or a sensitivity factor for now. -She tells me she has irritation at the site of her pump attachment and I advised her to use skin taq or Flonase for prepping the skin before attaching the pump -I also advised her to attach the CGM if possible.  If she has problems, we can get Antonieta Iba to help her with this.  She has seen her in the past for nutrition advice. - I advised her to:  Patient Instructions  Please use pump settings: - basal rates: 12 am: 0.800 units/h 3 am: 0.875 >> 0.900 9:30 am: 0.600 >> 0.650 5 pm: 1.300 6:30 pm: 1.200 11:30 pm: 1.500 >> 1.200 - ICR: 1:8 - target: 110 - ISF:  12 am: 1:20 8 pm: 1:30 - Active insulin time: 5h >> 4h   Please return in 3 months.   - advised to check sugars at different times of the day - 4x a day, rotating check times - advised for yearly  eye exams >> she is UTD - return to clinic in 3 months   2.  Hashimoto's hypothyroidism - latest thyroid labs reviewed with pt >> normal: Lab Results  Component Value Date   TSH 1.73 11/27/2019   - she continues on LT4 64 mcg daily (75 mcg 6/7 days) - pt feels good on this lower dose. - we discussed about taking the thyroid hormone every day, with water, >30 minutes before breakfast, separated by >4 hours from acid reflux medications, calcium, iron, multivitamins. Pt. is taking it correctly.  3. HL -Reviewed latest lipid panel from 08/2019: LDL still above goal, improved, triglycerides only slightly high, HDL at goal: Lab Results  Component Value Date   CHOL 212 (H) 09/10/2019   HDL 40.40 09/10/2019   LDLCALC 141 (H) 09/10/2019   LDLDIRECT 177.0 08/09/2016   TRIG 153.0 (H) 09/10/2019   CHOLHDL  5 09/10/2019  -She is on Crestor 10.  She has some muscle aches related.  - time spent with the patient: 40 min, of which >50% was spent in reviewing her pump and glucometer downloads, discussing her hypo- and hyper-glycemic episodes, reviewing previous labs and pump settings and developing a plan to avoid hypo- and hyper-glycemia.  I also addressed the rest of her endocrine problems.  Philemon Kingdom, MD PhD [facility] Endocrinology

## 2020-01-28 ENCOUNTER — Encounter: Payer: Self-pay | Admitting: Family Medicine

## 2020-01-28 DIAGNOSIS — E109 Type 1 diabetes mellitus without complications: Secondary | ICD-10-CM | POA: Diagnosis not present

## 2020-01-29 ENCOUNTER — Encounter: Payer: Self-pay | Admitting: Family Medicine

## 2020-01-30 ENCOUNTER — Other Ambulatory Visit: Payer: Self-pay

## 2020-01-30 ENCOUNTER — Encounter: Payer: Self-pay | Admitting: Family

## 2020-01-30 ENCOUNTER — Ambulatory Visit: Payer: PPO | Admitting: Family

## 2020-01-30 VITALS — BP 110/64 | HR 73 | Ht 63.0 in | Wt 153.4 lb

## 2020-01-30 DIAGNOSIS — E782 Mixed hyperlipidemia: Secondary | ICD-10-CM | POA: Diagnosis not present

## 2020-01-30 DIAGNOSIS — I739 Peripheral vascular disease, unspecified: Secondary | ICD-10-CM

## 2020-01-30 DIAGNOSIS — I10 Essential (primary) hypertension: Secondary | ICD-10-CM | POA: Diagnosis not present

## 2020-01-30 DIAGNOSIS — I6523 Occlusion and stenosis of bilateral carotid arteries: Secondary | ICD-10-CM

## 2020-01-30 NOTE — Patient Instructions (Signed)
Medication Instructions:  No medication changes today.   *If you need a refill on your cardiac medications before your next appointment, please call your pharmacy*  Lab Work: No lab work today.   Testing/Procedures: Your EKG today shows normal sinus rhythm which is a normal result.   Follow-Up: At [facility], you and your health needs are our priority.  As part of our continuing mission to provide you with exceptional heart care, we have created designated Provider Care Teams.  These Care Teams include your primary Cardiologist (physician) and Advanced Practice Providers (APPs -  Physician Assistants and Nurse Practitioners) who all work together to provide you with the care you need, when you need it.  We recommend signing up for the patient portal called "MyChart".  Sign up information is provided on this After Visit Summary.  MyChart is used to connect with patients for Virtual Visits (Telemedicine).  Patients are able to view lab/test results, encounter notes, upcoming appointments, etc.  Non-urgent messages can be sent to your provider as well.   To learn more about what you can do with MyChart, go to [email or web address].    Your next appointment:   6 month(s)  The format for your next appointment:   In Person  Provider:   You may see Ida Rogue, MD or one of the following Advanced Practice Providers on your designated Care Team:    Murray Hodgkins, NP  Christell Faith, PA-C  Marrianne Mood, PA-C  Other Instructions  Continue to keep your legs up when sitting.   If you start having leg pain while walking please let us know.   We would consider adding a medication called Nexletol (Bempedoic acid) for your hyperlipidemia. Information is included below. We can discuss at follow up or if you would like to start sooner, simply call our office.   Bempedoic acid tablets What is this medicine? Bempedoic acid (BEM pe DOE ik AS id) is used to lower the level of  cholesterol in the blood. It is used with other cholesterol-lowering drugs. This medicine may be used for other purposes; ask your health care provider or pharmacist if you have questions. COMMON BRAND NAME(S): NEXLETOL What should I tell my health care provider before I take this medicine? They need to know if you have any of these conditions:  gout  kidney problems  liver problems  tendon problems  an unusual or allergic reaction to bempedoic acid, other medicines, foods, dyes, or preservatives  pregnant or trying to become pregnant  breast-feeding How should I use this medicine? Take this medicine by mouth with a glass of water. Follow the directions on the prescription label. You can take it with or without food. If it upsets your stomach, take it with food. Take your doses at regular intervals. Do not take your medicine more often than directed. Talk to your pediatrician about the use of this medicine in children. Special care may be needed. Overdosage: If you think you have taken too much of this medicine contact a poison control center or emergency room at once. NOTE: This medicine is only for you. Do not share this medicine with others. What if I miss a dose? If you miss a dose, take it as soon as you can. If it is almost time for your next dose, take only that dose. Do not take double or extra doses. What may interact with this medicine? This medicine may interact with the following medications:  simvastatin  pravastatin This list may not  describe all possible interactions. Give your health care provider a list of all the medicines, herbs, non-prescription drugs, or dietary supplements you use. Also tell them if you smoke, drink alcohol, or use illegal drugs. Some items may interact with your medicine. What should I watch for while using this medicine? Visit your health care professional for regular checks on your progress. Tell your health care professional if your  symptoms do not start to get better or if they get worse. You may need blood work done while you are taking this medicine. This drug is only part of a total heart-health program. Your doctor or a dietician can suggest a low-cholesterol and low-fat diet to help. Avoid alcohol and smoking, and keep a proper exercise schedule. What side effects may I notice from receiving this medicine? Side effects that you should report to your doctor or health care professional as soon as possible:  allergic reactions like skin rash, itching or hives, swelling of the face, lips, or tongue  signs of gout such as swollen, red, warm, or tender joints, especially in the toes  signs of tendon problems such as tendon pain or swelling or if you are unable to move a joint Side effects that usually do not require medical attention (report these to your doctor or health care professional if they continue or are bothersome):  back pain  cold or flu-like symptoms  muscle spasms  stomach pain This list may not describe all possible side effects. Call your doctor for medical advice about side effects. You may report side effects to FDA at 1-800-FDA-1088. Where should I keep my medicine? Keep out of the reach of children. Store at room temperature between 20 and 25 degrees C (68 and 77 degrees F). Keep this medicine in the original container. Do not throw out the packet in the container. It keeps the medicine dry. Throw away any unused medication after the expiration date. NOTE: This sheet is a summary. It may not cover all possible information. If you have questions about this medicine, talk to your doctor, pharmacist, or health care provider.  2020 Elsevier/Gold Standard (2018-11-21 16:19:16)

## 2020-01-30 NOTE — Progress Notes (Signed)
Office Visit    Patient Name: Cassandra Benson Date of Encounter: 01/30/2020  Primary Care Provider:  [name], MD Primary Cardiologist:  Ida Rogue, MD Electrophysiologist:  None   Chief Complaint    Cassandra Benson is a 79 y.o. 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   Past Medical History    Past Medical History:  Diagnosis Date  . Allergy   . ANA positive    positive ANA pattern 1 speckled  . Arthritis   . Carotid stenosis, asymptomatic 06/19/2015   8-09% RICA 98-33% LICA rpt 1 yr (04/2504)   . Dermatomyositis ([place])   . 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 Diverticulosis    sigmoid on CT scan 12/2019  . Family history of adverse reaction to anesthesia    brothr went into cardiac arrest from anectine  . Fibromyalgia    prior PCP  . GERD (gastroesophageal reflux Benson)    prior PCP  . Glaucoma    Narrow angle  . History of blood clots    DVT, in 20s, none since  . History of chicken pox   . History of diverticulitis   . History of pericarditis 1986   with hospitalization  . History of pneumonia 2014  . History of shingles   . History of UTI   . Hyperlipidemia   . Hypertension   . Hypothyroidism   . 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 Partial small bowel obstruction ([place]) 12/2019   managed conservatively  . PONV (postoperative nausea and vomiting)   . Raynaud's Benson without gangrene   . Shoulder pain left   h/o RTC tendonitis and adhesive capsulitis  . Sjogren's syndrome ([place])   . Sleep apnea    prior PCP - no CPAP for about 10 yrs  . 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[place])   . Vitamin D deficiency    prior PCP   Past Surgical History:  Procedure Laterality Date  . ABDOMINAL HYSTERECTOMY  1978   fibroids and menorrhagia, ovaries remain  . ARTERY BIOPSY Right 04/06/2018   Procedure: BIOPSY TEMPORAL  ARTERY RIGHT;  Surgeon: Beverly Gust, MD;  Location: [place];  Service: ENT;  Laterality: Right;  Diabetic - insulin pump sleep apnea  . [facility] normal per patient  . COLONOSCOPY  10/2011   1 TA, 1 HP, very tortuous colon ([place])  . COLONOSCOPY WITH ESOPHAGOGASTRODUODENOSCOPY (EGD)  03/2007   2 ulcers, benign polyp, rpt 5 yrs ([place])  . JOINT REPLACEMENT Right    hip  . PARTIAL HIP ARTHROPLASTY  2013   Right hip replacement  . TONSILLECTOMY    . TONSILLECTOMY AND ADENOIDECTOMY    . TRANSCAROTID ARTERY REVASCULARIZATION Left 07/23/2018   Procedure: TRANSCAROTID ARTERY REVASCULARIZATION;  Surgeon: Marty Heck, MD;  Location: [facility];  Service: Vascular;  Laterality: Left;  . TUBAL LIGATION    . VAGINAL DELIVERY     x2, no complications    Allergies  Allergies  Allergen Reactions  . Iodinated Diagnostic Agents Other (See Comments)    Itching (severe) and chest tightness  . Penicillins Anaphylaxis, Swelling, Rash and Other (See Comments)    Has patient had a PCN reaction causing immediate rash, facial/tongue/throat swelling, SOB or lightheadedness with hypotension: Yes Has patient had a PCN reaction causing severe  rash involving mucus membranes or skin necrosis: Unknown Has patient had a PCN reaction that required hospitalization: Unknown Has patient had a PCN reaction occurring within the last 10 years: No If all of the above answers are "NO", then may proceed with Cephalosporin use.   . Amlodipine Swelling    Pedal edema  . Anectine [Succinylcholine] Other (See Comments)    Brother went into cardiac arrest.  . Codeine Nausea Only  . Gabapentin Other (See Comments)    Gait abnormality  . Influenza Vaccines Other (See Comments)    Muscle weakness; unable to walk  . Nortriptyline Other (See Comments)    Eye swelling and mouth drawed up  . Pamelor [Nortriptyline Hcl] Other (See Comments)    Patient  states caused her face to draw in together.  . Valsartan Other (See Comments) and Cough    Allergy to generic only, "Hacking" cough  . Erythromycin Rash and Swelling  . Sulfa Antibiotics Rash    History of Present Illness    Cassandra Benson is a 79 y.o. 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 She was last seen 03/26/19 by Dr. Rockey Situ.  She had prior cardiac catheterization with no significant Benson in 2000 for chest pain. She underwent TCAR with Dr. Carlis Abbott 02/2019 for asymptomatic high grade stenosis of left ICA. Carotid duplex 08/2019 with right ICA 1-39% stenosis and left ICS with patent Benson. Leane Call 03/2019 was low risk.   She was admitted late April with partial SBO treated with NG tube, NPO, IVF which was followed with spontaneous resolution.   Very pleasant lady who enjoys reading and writing in her spare time.  We discussed her recent admission and she is understandably frustrated by delays in the emergency department and has expressed these concerns via letter.  She was very appreciative of the care received on the floor.  Reports intermittent fast heart rates about 100 bpm a few months ago.  Associate with some palpitations.  This is improved since her primary care adjusted her metoprolol.  Discussed her carotid artery Benson. She plans to have repeat duplex in December.  Reports no symptoms such as amaurosis fugax.  Intermittent lower extremity edema has been well controlled with elevating her lower extremities.  She reports no pain with in her legs with ambulation.  Does note chronic hip pain that is stable at baseline.  Reports no shortness of breath nor dyspnea on exertion. Reports no chest pain, pressure, or tightness. N, orthopnea, PND. Reports no palpitations.   EKGs/Labs/Other Studies Reviewed:   The following studies were reviewed today:  Lexiscan Myoview 03/2019 Pharmacological myocardial perfusion  imaging study with no significant  ischemia Normal wall motion, EF estimated at 93% No EKG changes concerning for ischemia at peak stress or in recovery. Low risk scan  Echo 09/2017 Left ventricle: The cavity size was normal. There was mild    concentric hypertrophy. Systolic function was normal. The    estimated ejection fraction was in the range of 60% to 65%. Wall    motion was normal; there were no regional wall motion    abnormalities. Doppler parameters are consistent with abnormal    left ventricular relaxation (grade 1 diastolic dysfunction).   Carotid Duplex 08/2019 Right Carotid: Velocities in the right ICA are consistent with a 1-39%  stenosis.   Left Carotid: Patent Benson with no evidence of stenosis.   EKG:  EKG is ordered today.  The  ekg ordered today demonstrate NSR 70 bpm  Recent Labs: 11/27/2019: TSH 1.73 01/14/2020: ALT 12 01/15/2020: Hemoglobin 13.6; Platelets 225 01/16/2020: BUN 11; Creatinine, Ser 1.02; Potassium 4.3; Sodium 141  Recent Lipid Panel    Component Value Date/Time   CHOL 212 (H) 09/10/2019 0749   CHOL 236 01/11/2016 0000   TRIG 153.0 (H) 09/10/2019 0749   TRIG 175 01/11/2016 0000   HDL 40.40 09/10/2019 0749   HDL 45 01/29/2015 0000   CHOLHDL 5 09/10/2019 0749   VLDL 30.6 09/10/2019 0749   LDLCALC 141 (H) 09/10/2019 0749   LDLCALC 160 01/11/2016 0000   LDLDIRECT 177.0 08/09/2016 0905    Home Medications   Current Meds  Medication Sig  . acetaminophen (TYLENOL) 500 MG tablet Take 1 tablet (500 mg total) by mouth 3 (three) times daily as needed.  . Ascorbic Acid (VITAMIN C) 100 MG tablet Take 100 mg by mouth daily.  Marland Kitchen aspirin EC 81 MG tablet Take 81 mg by mouth daily.  . BD ULTRA-FINE LANCETS lancets Use as instructed upto 6 times daily  . benzocaine (ORAJEL) 10 % mucosal gel Use as directed 1 application in the mouth or throat 4 (four) times daily as needed for mouth pain.  Marland Kitchen clopidogrel (PLAVIX) 75 MG tablet Take 1 tablet (75 mg total) by  mouth daily.  . Continuous Blood Gluc Receiver (DEXCOM G6 RECEIVER) DEVI 1 kit by Does not apply route See admin instructions. Use to check blood sugar 4 times a day  . Continuous Blood Gluc Sensor (DEXCOM G6 SENSOR) MISC 1 each by Does not apply route See admin instructions. Change sensor every 10 days  . Continuous Blood Gluc Transmit (DEXCOM G6 TRANSMITTER) MISC 1 kit by Does not apply route See admin instructions. Use to check blood sugar  . diclofenac sodium (VOLTAREN) 1 % GEL Apply 1 application topically 3 (three) times daily.  Marland Kitchen glucagon 1 MG injection Inject 1 mg into the muscle once as needed for up to 1 dose.  Marland Kitchen glucose blood (CONTOUR NEXT TEST) test strip Use as instructed to check 4 times daily  . insulin aspart (NOVOLOG) 100 UNIT/ML FlexPen Inject 6-10 Units into the skin 3 (three) times daily with meals.  . insulin aspart (NOVOLOG) 100 UNIT/ML injection Use up to 50 units a day in the insulin pump  . Insulin Degludec (TRESIBA FLEXTOUCH [place]) Inject into the skin as needed.  . Insulin Pen Needle 32G X 4 MM MISC Use 4-5x a day  . Krill Oil 500 MG CAPS Take 2 capsules (1,000 mg total) by mouth daily. (Patient taking differently: Take 500 mg by mouth daily. )  . lisinopril (ZESTRIL) 10 MG tablet TAKE 1 TABLET(10 MG) BY MOUTH DAILY  . memantine (NAMENDA) 5 MG tablet Take 1 tablet (5 mg total) by mouth daily for 7 days, THEN 1 tablet (5 mg total) 2 (two) times daily.  . metoprolol tartrate (LOPRESSOR) 50 MG tablet Take 50 mg by mouth 2 (two) times daily.  Marland Kitchen neomycin-polymyxin-dexamethasone (MAXITROL) 0.1 % ophthalmic suspension Place 1 drop into both eyes daily.  . nitroGLYCERIN (NITROSTAT) 0.4 MG SL tablet Place 1 tablet (0.4 mg total) under the tongue every 5 (five) minutes as needed for chest pain.  . pantoprazole (PROTONIX) 40 MG tablet Take 1 tablet (40 mg total) by mouth daily as needed (GERD).  Marland Kitchen PAPAYA PO Take 1 tablet by mouth daily as needed.   . rosuvastatin (CRESTOR) 10 MG  tablet Take 1 tablet (10 mg total) by  mouth daily.  Marland Kitchen SYNTHROID 75 MCG tablet TAKE 1 TABLET BY MOUTH ONCE DAILY FOR 6 DAYS AND 1 AND 1/2 TABLETS ON THE 7TH DAY (Patient taking differently: TAKE 1 TABLET BY MOUTH ONCE DAILY FOR 6 DAYS)    Review of Systems   Review of Systems  Constitution: Negative for chills, fever and malaise/fatigue.  Cardiovascular: Positive for leg swelling. Negative for chest pain, dyspnea on exertion, near-syncope, orthopnea, palpitations and syncope.  Respiratory: Negative for cough, shortness of breath and wheezing.   Musculoskeletal:       Right hip pain  Gastrointestinal: Negative for nausea and vomiting.  Neurological: Negative for dizziness, light-headedness and weakness.   All other systems reviewed and are otherwise negative except as noted above.  Physical Exam    VS:  BP 110/64 (BP Location: Left Arm, Patient Position: Sitting, Cuff Size: Normal)   Pulse 73   Ht '5\' 3"'  (1.6 m)   Wt 153 lb 6 oz (69.6 kg)   SpO2 98%   BMI 27.17 kg/m  , BMI Body mass index is 27.17 kg/m. GEN: Well nourished, well developed, in no acute distress. HEENT: normal. Neck: Supple, no JVD, carotid bruits, or masses. Cardiac: RRR, no murmurs, rubs, or gallops. No clubbing, cyanosis, edema.  Radials/DP/PT 2+ and equal bilaterally.  Respiratory:  Respirations regular and unlabored, clear to auscultation bilaterally. GI: Soft, nontender, nondistended. MS: No deformity or atrophy. Skin: Warm and dry, no rash. Varicose veins notes to bilateral lower extremities. Raynaud's appearance to bilateral toes.  Neuro:  Strength and sensation are intact. Psych: Normal affect.   Assessment & Plan    1. Carotid artery Benson/PAD - 08/2019 carotid duplex with Dr Carlis Abbott showing patent L ICA Benson and R ICA with 1-39% stenosis. Continue aspirin, statin, statin.   2. LE edema - No edema on exam today. Does try to keep lower extremities elevated. Noted varicose veins on exam. Anticipate venous  insufficiency is etiology. Reports no claudication symptoms with ambulation.   3. Cassandra - Lipid panel 08/2019 with total cholesterol 212, LDL 141, triglycerides 153, HDL 40. Tolerates Crestor 48m daily. Previously intolerant of higher doses. Intolerant of Zetia. We discussed added Nexletol, she was agreeable to consider and was given information for review. Rediscuss at follow up.   4. HTN - BP well controlled today. Continue present antihypertensive regimen.   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 GRenne Criglerof endocrinology.   Disposition: Follow up in 6 month(s) with Dr. GRockey Situor APP.    Cassandra Dubonnet NP 01/30/2020, 10:18 AM

## 2020-02-05 ENCOUNTER — Ambulatory Visit: Payer: PPO | Admitting: Physician Assistant

## 2020-02-05 ENCOUNTER — Encounter: Payer: Self-pay | Admitting: Physician Assistant

## 2020-02-05 ENCOUNTER — Telehealth: Payer: Self-pay | Admitting: *Deleted

## 2020-02-05 ENCOUNTER — Other Ambulatory Visit: Payer: Self-pay

## 2020-02-05 VITALS — BP 118/60 | HR 100 | Temp 98.0°F | Ht 63.5 in | Wt 152.4 lb

## 2020-02-05 DIAGNOSIS — R159 Full incontinence of feces: Secondary | ICD-10-CM | POA: Diagnosis not present

## 2020-02-05 DIAGNOSIS — Z8719 Personal history of other diseases of the digestive system: Secondary | ICD-10-CM

## 2020-02-05 DIAGNOSIS — Z7901 Long term (current) use of anticoagulants: Secondary | ICD-10-CM

## 2020-02-05 DIAGNOSIS — R194 Change in bowel habit: Secondary | ICD-10-CM

## 2020-02-05 MED ORDER — NA SULFATE-K SULFATE-MG SULF 17.5-3.13-1.6 GM/177ML PO SOLN: 1.0000 | Freq: Once | ORAL | 0 refills | Status: AC

## 2020-02-05 NOTE — Telephone Encounter (Signed)
[facility] Pre-operative Risk Assessment     Request for surgical clearance:     Endoscopy Procedure  What type of surgery is being performed?     colonoscopy  When is this surgery scheduled?     Wednesday 03/25/20  What type of clearance is required ?   Pharmacy  Are there any medications that need to be held prior to surgery and how long? Plavix 5 days  Practice name and name of physician performing surgery?  Zenovia Jarred, MD   [facility]  What is your office phone and fax number?      Phone- [phone]  [phone]  Anesthesia type (None, local, MAC, general) ?       MAC

## 2020-02-05 NOTE — Telephone Encounter (Signed)
Dr Rockey Situ I'm not sure if this Plavix question should go to you or Dr Carlis Abbott.  She had a LICA stent in (?) Oct 2019 by Dr Carlis Abbott.  Dopplers Dec 2020 showed this was patent with a 1-39% RICA. They are asking for a 5 day hold pre colonoscopy.   Kerin Ransom PA-C 02/05/2020 3:34 PM

## 2020-02-05 NOTE — Telephone Encounter (Signed)
Cassandra Benson 03-Dec-1940 [id number]   Dear Dr. Cruzita Lederer    Dr. Hilarie Fredrickson has scheduled the above patient for a(n) colonoscopy at 9 am on Wednesday 03/25/20.  Our records show that he/she is on insulin therapy via an insulin pump.  Our colonoscopy prep protocol requires that:  the patient must be on a clear liquid diet the entire day prior to the procedure date as well as the morning of the procedure  the patient must be NPO for 3 to 4 hours prior to the procedure   the patient must consume a PEG 3350 solution to prepare for the procedure.  Please advise [place] of any adjustments that need to be made to the patient's insulin pump therapy prior to the above procedure date.    Please route or fax back this completed form to me at [phone] .  If you have any questions, please call me at [phone].  Thank you for your help with this matter.  Sincerely,  Caryl Asp, [place]    Physician Recommendation:  ________________________________________________  ________________________________________________________________________  _______________________________________________________________________  ________________________________________________________________________

## 2020-02-05 NOTE — Progress Notes (Signed)
Chief Complaint: Follow-up after small bowel obstruction  HPI:    Cassandra Benson is a 79 year old Caucasian female with a past medical history of carotid stenosis status post left ICA stent, lupus, fibromyalgia, GERD and blood clots on Plavix, known to Dr. Hilarie Fredrickson, who was referred to me by [name], MD for a complaint of follow-up after small bowel obstruction.      10/17/2012 recorded date of last colonoscopy.  We do not have results.    01/15/2020 patient discharged from the hospital after small bowel obstruction.  This subsequently spontaneously resolved after NG tube was inserted and patient kept n.p.o. with IV fluids.  Seen by surgery during that hospitalization who recommended a follow-up colonoscopy.  CT showed mildly prominent upper abdominal small bowel loops with decompressed distal small bowel.  Sigmoid diverticulosis without diverticulitis.    Today, the patient presents to clinic and explains that nothing like this has ever happened to her before.  She had an acute onset of abdominal pain followed by nausea and vomiting which brought her to the hospital with course as above.  Explains that everything resolved but when she was sent home she was having massive uncontrollable diarrhea which would "mess all over me", this slowly stopped but now she is having incontinence of solid stool.  Tells me that she has a lot of urgency with 3-4 solid bowel movements a day and if she does not make it then there is a big mess.  This is very disconcerting to her.  Tells me she has chronic abdominal pain from her lupus, but nothing is changed with this recently.    Her husband passed away 4 years ago with colon cancer, known to Dr. Hilarie Fredrickson.  Reports her last colonoscopy 8 years ago but thinks this was normal.    Denies fever, chills, weight loss, blood in her stool or symptoms that awaken her from sleep.  Past Medical History:  Diagnosis Date  . Allergy   . ANA positive    positive ANA pattern 1  speckled  . Arthritis   . Carotid stenosis, asymptomatic 06/19/2015   7-03% RICA 50-09% LICA rpt 1 yr (11/8180)   . Dermatomyositis ([place])   . Diabetes mellitus without complication (HCC)    Type 1  . Diverticulosis    sigmoid on CT scan 12/2019  . Family history of adverse reaction to anesthesia    brothr went into cardiac arrest from anectine  . Fibromyalgia    prior PCP  . GERD (gastroesophageal reflux disease)    prior PCP  . Glaucoma    Narrow angle  . History of blood clots    DVT, in 20s, none since  . History of chicken pox   . History of diverticulitis   . History of pericarditis 1986   with hospitalization  . History of pneumonia 2014  . History of shingles   . History of UTI   . Hyperlipidemia   . Hypertension   . Hypothyroidism   . Mixed connective tissue disease ([place])   . Partial small bowel obstruction ([place]) 12/2019   managed conservatively  . PONV (postoperative nausea and vomiting)   . Raynaud's disease without gangrene   . Shoulder pain left   h/o RTC tendonitis and adhesive capsulitis  . Sjogren's syndrome ([place])   . Sleep apnea    prior PCP - no CPAP for about 10 yrs  . 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[place])   . Vitamin D deficiency    prior PCP  Past Surgical History:  Procedure Laterality Date  . ABDOMINAL HYSTERECTOMY  1978   fibroids and menorrhagia, ovaries remain  . ARTERY BIOPSY Right 04/06/2018   Procedure: BIOPSY TEMPORAL ARTERY RIGHT;  Surgeon: Beverly Gust, MD;  Location: [facility];  Service: ENT;  Laterality: Right;  Diabetic - insulin pump sleep apnea  . [facility] normal per patient  . COLONOSCOPY  10/2011   1 TA, 1 HP, very tortuous colon ([place])  . COLONOSCOPY WITH ESOPHAGOGASTRODUODENOSCOPY (EGD)  03/2007   2 ulcers, benign polyp, rpt 5 yrs ([place], [place])  . JOINT REPLACEMENT Right    hip  . PARTIAL HIP ARTHROPLASTY  2013   Right hip replacement  . TONSILLECTOMY    .  TONSILLECTOMY AND ADENOIDECTOMY    . TRANSCAROTID ARTERY REVASCULARIZATION Left 07/23/2018   Procedure: TRANSCAROTID ARTERY REVASCULARIZATION;  Surgeon: Marty Heck, MD;  Location: [facility];  Service: Vascular;  Laterality: Left;  . TUBAL LIGATION    . VAGINAL DELIVERY     x2, no complications    Current Outpatient Medications  Medication Sig Dispense Refill  . acetaminophen (TYLENOL) 500 MG tablet Take 1 tablet (500 mg total) by mouth 3 (three) times daily as needed.    . Ascorbic Acid (VITAMIN C) 100 MG tablet Take 100 mg by mouth daily.    Marland Kitchen aspirin EC 81 MG tablet Take 81 mg by mouth daily.    . BD ULTRA-FINE LANCETS lancets Use as instructed upto 6 times daily 600 each 2  . benzocaine (ORAJEL) 10 % mucosal gel Use as directed 1 application in the mouth or throat 4 (four) times daily as needed for mouth pain.    Marland Kitchen clopidogrel (PLAVIX) 75 MG tablet Take 1 tablet (75 mg total) by mouth daily. 30 tablet 6  . Continuous Blood Gluc Receiver (DEXCOM G6 RECEIVER) DEVI 1 kit by Does not apply route See admin instructions. Use to check blood sugar 4 times a day 1 each 0  . Continuous Blood Gluc Sensor (DEXCOM G6 SENSOR) MISC 1 each by Does not apply route See admin instructions. Change sensor every 10 days 3 each 3  . Continuous Blood Gluc Transmit (DEXCOM G6 TRANSMITTER) MISC 1 kit by Does not apply route See admin instructions. Use to check blood sugar 1 each 1  . diclofenac sodium (VOLTAREN) 1 % GEL Apply 1 application topically 3 (three) times daily. 1 Tube 1  . glucagon 1 MG injection Inject 1 mg into the muscle once as needed for up to 1 dose. 1 each 12  . glucose blood (CONTOUR NEXT TEST) test strip Use as instructed to check 4 times daily 400 each 5  . insulin aspart (NOVOLOG) 100 UNIT/ML FlexPen Inject 6-10 Units into the skin 3 (three) times daily with meals. 15 mL 11  . insulin aspart (NOVOLOG) 100 UNIT/ML injection Use up to 50 units a day in the insulin pump 50 mL 3  . Insulin  Degludec (TRESIBA FLEXTOUCH [place]) Inject into the skin as needed.    . Insulin Pen Needle 32G X 4 MM MISC Use 4-5x a day 300 each 3  . Krill Oil 500 MG CAPS Take 2 capsules (1,000 mg total) by mouth daily. (Patient taking differently: Take 500 mg by mouth daily. )    . lisinopril (ZESTRIL) 10 MG tablet TAKE 1 TABLET(10 MG) BY MOUTH DAILY 90 tablet 0  . memantine (NAMENDA) 5 MG tablet Take 1 tablet (5 mg total)  by mouth daily for 7 days, THEN 1 tablet (5 mg total) 2 (two) times daily. 60 tablet 1  . metoprolol tartrate (LOPRESSOR) 50 MG tablet Take 50 mg by mouth 2 (two) times daily.    Marland Kitchen neomycin-polymyxin-dexamethasone (MAXITROL) 0.1 % ophthalmic suspension Place 1 drop into both eyes daily.    . nitroGLYCERIN (NITROSTAT) 0.4 MG SL tablet Place 1 tablet (0.4 mg total) under the tongue every 5 (five) minutes as needed for chest pain. 30 tablet 1  . pantoprazole (PROTONIX) 40 MG tablet Take 1 tablet (40 mg total) by mouth daily as needed (GERD).    Marland Kitchen PAPAYA PO Take 1 tablet by mouth daily as needed.     . rosuvastatin (CRESTOR) 10 MG tablet Take 1 tablet (10 mg total) by mouth daily. 30 tablet 5  . SYNTHROID 75 MCG tablet TAKE 1 TABLET BY MOUTH ONCE DAILY FOR 6 DAYS AND 1 AND 1/2 TABLETS ON THE 7TH DAY (Patient taking differently: TAKE 1 TABLET BY MOUTH ONCE DAILY FOR 6 DAYS) 105 tablet 3   No current facility-administered medications for this visit.    Allergies as of 02/05/2020 - Review Complete 01/30/2020  Allergen Reaction Noted  . Iodinated diagnostic agents Other (See Comments) 05/26/2015  . Penicillins Anaphylaxis, Swelling, Rash, and Other (See Comments) 07/17/2014  . Amlodipine Swelling 05/14/2019  . Anectine [succinylcholine] Other (See Comments) 04/04/2018  . Codeine Nausea Only 07/17/2014  . Gabapentin Other (See Comments) 10/01/2017  . Influenza vaccines Other (See Comments) 07/17/2014  . Nortriptyline Other (See Comments) 08/21/2017  . Pamelor [nortriptyline hcl] Other (See  Comments) 06/29/2017  . Valsartan Other (See Comments) and Cough 07/17/2014  . Erythromycin Rash and Swelling 07/17/2014  . Sulfa antibiotics Rash 05/26/2015    Family History  Problem Relation Age of Onset  . CAD Mother 78       MI, aortic valve issues  . COPD Mother   . Lupus Mother   . Graves' disease Mother   . Rheum arthritis Mother   . CAD Father 57       CABG x2, aortic valve replacement  . Stroke Sister   . Alcohol abuse Brother   . CAD Brother 89       MI  . Stroke Brother   . Seizures Son   . COPD Brother   . CAD Brother 20       stent  . Diabetes Brother   . Depression Grandchild   . Cancer Maternal Aunt        breast  . Breast cancer Maternal Aunt   . Diabetes Sister        deceased  . CAD Sister 56       stents  . Breast cancer Maternal Aunt     Social History   Socioeconomic History  . Marital status: Widowed    Spouse name: Not on file  . Number of children: Not on file  . Years of education: Not on file  . Highest education level: Not on file  Occupational History  . Not on file  Tobacco Use  . Smoking status: Never Smoker  . Smokeless tobacco: Never Used  Substance and Sexual Activity  . Alcohol use: No  . Drug use: No  . Sexual activity: Not on file  Other Topics Concern  . Not on file  Social History Narrative   Lives in [place] now. Recently moved from [place]. Widow - husband decreased 01/2016 of metastatic colon CA   No pets.  Mother of Glenna Brunkow.   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 Difficulty of Paying Living Expenses:   Food Insecurity:   . Worried About Charity fundraiser in the Last Year:   . Arboriculturist in the Last Year:   Transportation  Needs:   . Film/video editor (Medical):   Marland Kitchen Lack of Transportation (Non-Medical):   Physical Activity:   . Days of Exercise per Week:   . Minutes of Exercise per Session:   Stress:   . Feeling of Stress :   Social Connections:   . Frequency of Communication with Friends and Family:   . Frequency of Social Gatherings with Friends and Family:   . Attends Religious Services:   . Active Member of Clubs or Organizations:   . Attends Archivist Meetings:   Marland Kitchen Marital Status:   Intimate Partner Violence:   . Fear of Current or Ex-Partner:   . Emotionally Abused:   Marland Kitchen Physically Abused:   . Sexually Abused:     Review of Systems:    Constitutional: No weight loss, fever or chills Skin: No rash  Cardiovascular: No chest pain  Respiratory: No SOB Gastrointestinal: See HPI and otherwise negative Genitourinary: No dysuria  Neurological: No headache Musculoskeletal: No new muscle or joint pain Hematologic: No bleeding Psychiatric: No history of depression or anxiety   Physical Exam:  Vital signs: Temp 98 F (36.7 C)   Ht 5' 3.5" (1.613 m) Comment: height measured without shoes  Wt 152 lb 6 oz (69.1 kg)   BMI 26.57 kg/m   Constitutional:   Pleasant Caucasian female appears to be in NAD, Well developed, Well nourished, alert and cooperative Head:  Normocephalic and atraumatic. Eyes:   PEERL, EOMI. No icterus. Conjunctiva pink. Ears:  Normal auditory acuity. Neck:  Supple Throat: Oral cavity and pharynx without inflammation, swelling or lesion.  Respiratory: Respirations even and unlabored. Lungs clear to auscultation bilaterally.   No wheezes, crackles, or rhonchi.  Cardiovascular: Normal S1, S2. No MRG. Regular rate and rhythm. No peripheral edema, cyanosis or pallor.  Gastrointestinal:  Soft, nondistended, mild generalized ttp. No rebound or guarding. Normal bowel sounds. No appreciable masses or hepatomegaly. Rectal:  Not performed.  Msk:  Symmetrical without  gross deformities. Without edema, no deformity or joint abnormality.  Neurologic:  Alert and  oriented x4;  grossly normal neurologically.  Skin:   Dry and intact without significant lesions or rashes. Psychiatric: Demonstrates good judgement and reason without abnormal affect or behaviors.  RELEVANT LABS AND IMAGING: CBC    Component Value Date/Time   WBC 10.2 01/15/2020 0501   RBC 5.09 01/15/2020 0501   HGB 13.6 01/15/2020 0501   HCT 43.0 01/15/2020 0501   PLT 225 01/15/2020 0501   MCV 84.5 01/15/2020 0501   MCH 26.7 01/15/2020 0501   MCHC 31.6 01/15/2020 0501   RDW 14.3 01/15/2020 0501   LYMPHSABS 2.0 10/07/2019 0943   MONOABS 0.5 10/07/2019 0943   EOSABS 0.1 10/07/2019 0943   BASOSABS 0.1 10/07/2019 0943    CMP     Component Value Date/Time   NA 141 01/16/2020 0436   NA 141 01/11/2016 0000   K  4.3 01/16/2020 0436   K 4.2 01/11/2016 0000   CL 106 01/16/2020 0436   CO2 27 01/16/2020 0436   GLUCOSE 140 (H) 01/16/2020 0436   BUN 11 01/16/2020 0436   CREATININE 1.02 (H) 01/16/2020 0436   CREATININE 0.89 01/11/2016 0000   CALCIUM 9.0 01/16/2020 0436   PROT 6.8 01/14/2020 2335   ALBUMIN 3.8 01/14/2020 2335   ALBUMIN 4.1 01/11/2016 0000   AST 17 01/14/2020 2335   AST 24 01/11/2016 0000   ALT 12 01/14/2020 2335   ALT 21 01/11/2016 0000   ALKPHOS 68 01/14/2020 2335   ALKPHOS 74 01/11/2016 0000   BILITOT 0.7 01/14/2020 2335   BILITOT 0.3 01/11/2016 0000   GFRNONAA 53 (L) 01/16/2020 0436   GFRAA >60 01/16/2020 0436    Assessment: 1.  Recent small bowel obstruction: See hospital records, spontaneously resolved 2.  Change in bowel habits: Towards very urgent solid stools 3-4 times a day 3.  Chronic anticoagulation for CAD: On Plavix  Plan: 1.  Scheduled patient for a colonoscopy in the [place] with Dr. Hilarie Fredrickson.  Did discuss risks, benefits, limitations and alternatives and the patient agrees to proceed.  She has had both of her Covid vaccines.  Of note she tells me she has  a "tortuous colon". 2.  Patient was advised to hold her Plavix for 5 days prior to time of procedure.  We will communicate with her prescribing physician to ensure this is acceptable to her. 3.  Discussed possible further testing/imaging in the future including small bowel pill capsule endoscopy/anal manometry or other.  She just wants to figure out what is going on.  Cassandra Newer, PA-C [facility] 02/05/2020, 11:19 AM  Cc: [name], MD

## 2020-02-05 NOTE — Patient Instructions (Addendum)
If you are age 78 or older, your body mass index should be between 23-30. Your Body mass index is 26.57 kg/m. If this is out of the aforementioned range listed, please consider follow up with your Primary Care Provider.  If you are age 67 or younger, your body mass index should be between 19-25. Your Body mass index is 26.57 kg/m. If this is out of the aformentioned range listed, please consider follow up with your Primary Care Provider.   You will be contacted by our office prior to your procedure for directions on holding your Plavix.  If you do not hear from our office 1 week prior to your scheduled procedure, please call [phone] to discuss.   You have been scheduled for a colonoscopy. Please follow written instructions given to you at your visit today.  Please pick up your prep supplies at the pharmacy within the next 1-3 days. If you use inhalers (even only as needed), please bring them with you on the day of your procedure.  Due to recent changes in healthcare laws, you may see the results of your imaging and laboratory studies on MyChart before your provider has had a chance to review them.  We understand that in some cases there may be results that are confusing or concerning to you. Not all laboratory results come back in the same time frame and the provider may be waiting for multiple results in order to interpret others.  Please give [place] 48 hours in order for your provider to thoroughly review all the results before contacting the office for clarification of your results.   Follow up in clinic per recommendations from Dr. Hilarie Fredrickson after procedure.

## 2020-02-06 ENCOUNTER — Other Ambulatory Visit: Payer: Self-pay | Admitting: Family Medicine

## 2020-02-06 NOTE — Telephone Encounter (Signed)
Hi Cassandra Benson, Pt can use the pump basal rates as they are now during the prep and the procedure and do boluses when she is able to eat regularly. Sincerely, Philemon Kingdom MD

## 2020-02-06 NOTE — Telephone Encounter (Signed)
[facility] Last filled:  12/30/19, #60 Last OV:  01/21/20, hosp f/u Next OV:  02/18/20, f/u

## 2020-02-07 ENCOUNTER — Other Ambulatory Visit: Payer: Self-pay | Admitting: Family Medicine

## 2020-02-07 NOTE — Telephone Encounter (Signed)
Would hold the Plavix 5 days before procedure but stay on aspirin Following procedure get back on Plavix

## 2020-02-15 ENCOUNTER — Other Ambulatory Visit: Payer: Self-pay | Admitting: Family Medicine

## 2020-02-17 NOTE — Telephone Encounter (Signed)
Informed patient to hold Plavix 5 days before patient. Patient voiced understanding.

## 2020-02-17 NOTE — Telephone Encounter (Signed)
Informed patient of information below starting the day before her procedure.

## 2020-02-18 ENCOUNTER — Encounter: Payer: Self-pay | Admitting: Family Medicine

## 2020-02-18 ENCOUNTER — Ambulatory Visit (INDEPENDENT_AMBULATORY_CARE_PROVIDER_SITE_OTHER): Payer: PPO | Admitting: Family Medicine

## 2020-02-18 ENCOUNTER — Other Ambulatory Visit: Payer: Self-pay

## 2020-02-18 VITALS — BP 128/82 | HR 93 | Temp 97.5°F | Ht 63.5 in | Wt 156.4 lb

## 2020-02-18 DIAGNOSIS — I10 Essential (primary) hypertension: Secondary | ICD-10-CM

## 2020-02-18 DIAGNOSIS — K56600 Partial intestinal obstruction, unspecified as to cause: Secondary | ICD-10-CM | POA: Diagnosis not present

## 2020-02-18 DIAGNOSIS — R05 Cough: Secondary | ICD-10-CM | POA: Diagnosis not present

## 2020-02-18 DIAGNOSIS — R109 Unspecified abdominal pain: Secondary | ICD-10-CM

## 2020-02-18 DIAGNOSIS — R194 Change in bowel habit: Secondary | ICD-10-CM | POA: Insufficient documentation

## 2020-02-18 DIAGNOSIS — R059 Cough, unspecified: Secondary | ICD-10-CM

## 2020-02-18 MED ORDER — LOSARTAN POTASSIUM 25 MG PO TABS: 25.0000 mg | ORAL_TABLET | Freq: Every day | ORAL | 11 refills | Status: DC

## 2020-02-18 NOTE — Assessment & Plan Note (Signed)
Noticing increasing bowel frequency, urgency, albeit formed stools. Will see if stopping protonix can help symptoms. Update with effect.  Upcoming colonoscopy appreciate GI care.

## 2020-02-18 NOTE — Assessment & Plan Note (Signed)
Transition from lisinopril to losartan 25mg  daily. See below. Continue Toprol XL.

## 2020-02-18 NOTE — Progress Notes (Signed)
Addendum: Reviewed and agree with assessment and management plan. Lavonya Hoerner M, MD  

## 2020-02-18 NOTE — Assessment & Plan Note (Signed)
Appreciate GI eval - pending colonoscopy and possible EGD.

## 2020-02-18 NOTE — Progress Notes (Signed)
This visit was conducted in person.  BP 128/82 (BP Location: Left Arm, Patient Position: Sitting, Cuff Size: Normal)   Pulse 93   Temp (!) 97.5 F (36.4 C) (Temporal)   Ht 5' 3.5" (1.613 m)   Wt 156 lb 6 oz (70.9 kg)   SpO2 97%   BMI 27.27 kg/m    CC: 6 wk f/u visit  Subjective:    Patient ID: Cassandra Benson, female    DOB: 09/11/1941, 79 y.o.   MRN: [id number]  HPI: Cassandra Benson is a 79 y.o. female presenting on 02/18/2020 for Follow-up (Here for 6 wk f/u.  Pt states she is still having "stomach issues".  Says she will go into detail with Cassandra Benson. )   See prior notes for details. Recent hospitalization for partial SBO managed conservatively successfully with NGT, NPO, IVF. Since seen, has returned to endo and seen cardiology and GI (Cassandra Benson). Planned f/u colonoscopy, cleared to hold plavix 5d prior to procedure.   Intermittent abd pain but notes persistent bowel urgency of formed but loose stools with difficulty to clean afterwards. Up to 6-7 times a day. Diet has not changed. She is eating fruits/vegetables. No black tarry stools, no blood.   Continues protonix daily.  On lisinopril 42m daily for 6 yrs.      Relevant past medical, surgical, family and social history reviewed and updated as indicated. Interim medical history since our last visit reviewed. Allergies and medications reviewed and updated. Outpatient Medications Prior to Visit  Medication Sig Dispense Refill  . acetaminophen (TYLENOL) 500 MG tablet Take 1 tablet (500 mg total) by mouth 3 (three) times daily as needed.    . Ascorbic Acid (VITAMIN C) 100 MG tablet Take 100 mg by mouth daily.    .Marland Kitchenaspirin EC 81 MG tablet Take 81 mg by mouth daily.    . BD ULTRA-FINE LANCETS lancets Use as instructed upto 6 times daily 600 each 2  . benzocaine (ORAJEL) 10 % mucosal gel Use as directed 1 application in the mouth or throat 4 (four) times daily as needed for mouth pain.    .Marland Kitchenclopidogrel (PLAVIX) 75 MG tablet Take 1  tablet (75 mg total) by mouth daily. 30 tablet 6  . Continuous Blood Gluc Receiver (DEXCOM G6 RECEIVER) DEVI 1 kit by Does not apply route See admin instructions. Use to check blood sugar 4 times a day 1 each 0  . Continuous Blood Gluc Sensor (DEXCOM G6 SENSOR) MISC 1 each by Does not apply route See admin instructions. Change sensor every 10 days 3 each 3  . Continuous Blood Gluc Transmit (DEXCOM G6 TRANSMITTER) MISC 1 kit by Does not apply route See admin instructions. Use to check blood sugar 1 each 1  . diclofenac sodium (VOLTAREN) 1 % GEL Apply 1 application topically 3 (three) times daily. 1 Tube 1  . glucagon 1 MG injection Inject 1 mg into the muscle once as needed for up to 1 dose. 1 each 12  . glucose blood (CONTOUR NEXT TEST) test strip Use as instructed to check 4 times daily 400 each 5  . insulin aspart (NOVOLOG FLEXPEN) 100 UNIT/ML FlexPen Inject 6-10 Units into the skin 3 (three) times daily with meals.    . insulin aspart (NOVOLOG) 100 UNIT/ML injection Use up to 50 units a day in the insulin pump 50 mL 3  . Insulin Degludec (TRESIBA FLEXTOUCH [place]) Inject into the skin as needed.    . Insulin Pen Needle  32G X 4 MM MISC Use 4-5x a day 300 each 3  . Krill Oil 500 MG CAPS Take 2 capsules (1,000 mg total) by mouth daily. (Patient taking differently: Take 500 mg by mouth daily. )    . memantine (NAMENDA) 5 MG tablet Take 1 tablet (5 mg total) by mouth 2 (two) times daily. 60 tablet 3  . metoprolol tartrate (LOPRESSOR) 50 MG tablet Take 50 mg by mouth 2 (two) times daily.    Marland Kitchen neomycin-polymyxin-dexamethasone (MAXITROL) 0.1 % ophthalmic suspension Place 1 drop into both eyes daily.    . nitroGLYCERIN (NITROSTAT) 0.4 MG SL tablet Place 1 tablet (0.4 mg total) under the tongue every 5 (five) minutes as needed for chest pain. 30 tablet 1  . pantoprazole (PROTONIX) 40 MG tablet Take 1 tablet (40 mg total) by mouth daily as needed (GERD).    Marland Kitchen PAPAYA PO Take 1 tablet by mouth daily as needed.      . rosuvastatin (CRESTOR) 10 MG tablet Take 1 tablet (10 mg total) by mouth daily. 30 tablet 5  . SYNTHROID 75 MCG tablet TAKE 1 TABLET BY MOUTH ONCE DAILY FOR 6 DAYS AND 1 AND 1/2 TABLETS ON THE 7TH DAY (Patient taking differently: TAKE 1 TABLET BY MOUTH ONCE DAILY FOR 6 DAYS) 105 tablet 3  . lisinopril (ZESTRIL) 10 MG tablet TAKE 1 TABLET(10 MG) BY MOUTH DAILY 90 tablet 0   No facility-administered medications prior to visit.     Per HPI unless specifically indicated in ROS section below Review of Systems Objective:  BP 128/82 (BP Location: Left Arm, Patient Position: Sitting, Cuff Size: Normal)   Pulse 93   Temp (!) 97.5 F (36.4 C) (Temporal)   Ht 5' 3.5" (1.613 m)   Wt 156 lb 6 oz (70.9 kg)   SpO2 97%   BMI 27.27 kg/m   Wt Readings from Last 3 Encounters:  02/18/20 156 lb 6 oz (70.9 kg)  02/05/20 152 lb 6 oz (69.1 kg)  01/30/20 153 lb 6 oz (69.6 kg)      Physical Exam Vitals and nursing note reviewed.  Constitutional:      Appearance: Normal appearance. She is not ill-appearing.  Cardiovascular:     Rate and Rhythm: Normal rate and regular rhythm.     Pulses: Normal pulses.     Heart sounds: Normal heart sounds. No murmur.  Pulmonary:     Effort: Pulmonary effort is normal. No respiratory distress.     Breath sounds: Normal breath sounds. No wheezing, rhonchi or rales.  Abdominal:     General: Abdomen is flat. Bowel sounds are normal. There is no distension.     Palpations: Abdomen is soft. There is no mass.     Tenderness: There is abdominal tenderness in the right upper quadrant and right lower quadrant. There is no guarding or rebound. Negative signs include Murphy's sign.     Hernia: No hernia is present.     Comments: Insulin pump attached  Neurological:     Mental Status: She is alert.  Psychiatric:        Mood and Affect: Mood normal.        Behavior: Behavior normal.        Assessment & Plan:  This visit occurred during the SARS-CoV-2 public health  emergency.  Safety protocols were in place, including screening questions prior to the visit, additional usage of staff PPE, and extensive cleaning of exam room while observing appropriate contact time as indicated for disinfecting  solutions.   Problem List Items Addressed This Visit    Right sided abdominal pain    ?protonix vs lisinopril effect - will trial off protonix, transition to losartan 66m daily. Update with effect.       Partial small bowel obstruction (HCC)    Appreciate GI eval - pending colonoscopy and possible EGD.       Essential hypertension    Transition from lisinopril to losartan 253mdaily. See below. Continue Toprol XL.       Relevant Medications   losartan (COZAAR) 25 MG tablet   Cough    Improved with daily protonix - will trial off ACEI (onto losartan) to see if able to stop without needing PPI.       Bowel habit changes - Primary    Noticing increasing bowel frequency, urgency, albeit formed stools. Will see if stopping protonix can help symptoms. Update with effect.  Upcoming colonoscopy appreciate GI care.           Meds ordered this encounter  Medications  . losartan (COZAAR) 25 MG tablet    Sig: Take 1 tablet (25 mg total) by mouth daily.    Dispense:  30 tablet    Refill:  11    In place of lisinopril   No orders of the defined types were placed in this encounter.   Patient Instructions  Let's try stopping protonix, monitor effect on cough.  Let's transition from lisinopril (which would be more likely to cause irritant cough) to losartan 2575maily.  Update me with effect on looser stools/frequency and cough off protonix.    Follow up plan: Return if symptoms worsen or fail to improve.  JavRia BushD

## 2020-02-18 NOTE — Assessment & Plan Note (Signed)
Improved with daily protonix - will trial off ACEI (onto losartan) to see if able to stop without needing PPI.

## 2020-02-18 NOTE — Patient Instructions (Addendum)
Let's try stopping protonix, monitor effect on cough.  Let's transition from lisinopril (which would be more likely to cause irritant cough) to losartan 25mg  daily.  Update me with effect on looser stools/frequency and cough off protonix.

## 2020-02-18 NOTE — Assessment & Plan Note (Signed)
?  protonix vs lisinopril effect - will trial off protonix, transition to losartan 25mg  daily. Update with effect.

## 2020-02-19 ENCOUNTER — Encounter: Payer: Self-pay | Admitting: Family Medicine

## 2020-02-25 ENCOUNTER — Encounter: Payer: PPO | Attending: Internal Medicine | Admitting: Dietician

## 2020-02-25 DIAGNOSIS — E139 Other specified diabetes mellitus without complications: Secondary | ICD-10-CM | POA: Insufficient documentation

## 2020-02-25 HISTORY — PX: COLONOSCOPY: SHX174

## 2020-02-28 DIAGNOSIS — E109 Type 1 diabetes mellitus without complications: Secondary | ICD-10-CM | POA: Diagnosis not present

## 2020-03-02 ENCOUNTER — Ambulatory Visit
Admission: RE | Admit: 2020-03-02 | Discharge: 2020-03-02 | Disposition: A | Discharge status: Home / Self Care | Payer: PPO | Source: Ambulatory Visit | Attending: Family Medicine | Admitting: Family Medicine

## 2020-03-02 DIAGNOSIS — Z1231 Encounter for screening mammogram for malignant neoplasm of breast: Secondary | ICD-10-CM | POA: Diagnosis not present

## 2020-03-02 LAB — HM MAMMOGRAPHY

## 2020-03-03 ENCOUNTER — Encounter: Payer: Self-pay | Admitting: Family Medicine

## 2020-03-05 ENCOUNTER — Encounter (HOSPITAL_COMMUNITY): Payer: Self-pay | Admitting: Emergency Medicine

## 2020-03-05 ENCOUNTER — Emergency Department (HOSPITAL_COMMUNITY): Payer: PPO

## 2020-03-05 ENCOUNTER — Emergency Department (HOSPITAL_COMMUNITY)
Admission: EM | Admit: 2020-03-05 | Discharge: 2020-03-06 | Disposition: A | Discharge status: Home / Self Care | Payer: PPO | Attending: Emergency Medicine | Admitting: Emergency Medicine

## 2020-03-05 ENCOUNTER — Other Ambulatory Visit: Payer: Self-pay

## 2020-03-05 ENCOUNTER — Telehealth: Payer: Self-pay

## 2020-03-05 DIAGNOSIS — Z794 Long term (current) use of insulin: Secondary | ICD-10-CM | POA: Insufficient documentation

## 2020-03-05 DIAGNOSIS — Z96641 Presence of right artificial hip joint: Secondary | ICD-10-CM | POA: Insufficient documentation

## 2020-03-05 DIAGNOSIS — J984 Other disorders of lung: Secondary | ICD-10-CM | POA: Diagnosis not present

## 2020-03-05 DIAGNOSIS — R609 Edema, unspecified: Secondary | ICD-10-CM | POA: Insufficient documentation

## 2020-03-05 DIAGNOSIS — E119 Type 2 diabetes mellitus without complications: Secondary | ICD-10-CM | POA: Insufficient documentation

## 2020-03-05 DIAGNOSIS — I10 Essential (primary) hypertension: Secondary | ICD-10-CM | POA: Diagnosis not present

## 2020-03-05 DIAGNOSIS — R0602 Shortness of breath: Secondary | ICD-10-CM | POA: Diagnosis not present

## 2020-03-05 DIAGNOSIS — Z8673 Personal history of transient ischemic attack (TIA), and cerebral infarction without residual deficits: Secondary | ICD-10-CM | POA: Diagnosis not present

## 2020-03-05 DIAGNOSIS — Z7982 Long term (current) use of aspirin: Secondary | ICD-10-CM | POA: Diagnosis not present

## 2020-03-05 DIAGNOSIS — J939 Pneumothorax, unspecified: Secondary | ICD-10-CM | POA: Diagnosis not present

## 2020-03-05 DIAGNOSIS — M7989 Other specified soft tissue disorders: Secondary | ICD-10-CM | POA: Diagnosis present

## 2020-03-05 DIAGNOSIS — E039 Hypothyroidism, unspecified: Secondary | ICD-10-CM | POA: Insufficient documentation

## 2020-03-05 DIAGNOSIS — R600 Localized edema: Secondary | ICD-10-CM | POA: Diagnosis not present

## 2020-03-05 DIAGNOSIS — Z79899 Other long term (current) drug therapy: Secondary | ICD-10-CM | POA: Insufficient documentation

## 2020-03-05 LAB — CBC
HCT: 43.2 % (ref 36.0–46.0)
Hemoglobin: 13.2 g/dL (ref 12.0–15.0)
MCH: 26.3 pg (ref 26.0–34.0)
MCHC: 30.6 g/dL (ref 30.0–36.0)
MCV: 86.2 fL (ref 80.0–100.0)
Platelets: 227 10*3/uL (ref 150–400)
RBC: 5.01 MIL/uL (ref 3.87–5.11)
RDW: 14.4 % (ref 11.5–15.5)
WBC: 9.6 10*3/uL (ref 4.0–10.5)
nRBC: 0 % (ref 0.0–0.2)

## 2020-03-05 LAB — BASIC METABOLIC PANEL
Anion gap: 9 (ref 5–15)
BUN: 10 mg/dL (ref 8–23)
CO2: 24 mmol/L (ref 22–32)
Calcium: 9.7 mg/dL (ref 8.9–10.3)
Chloride: 112 mmol/L — ABNORMAL HIGH (ref 98–111)
Creatinine, Ser: 0.93 mg/dL (ref 0.44–1.00)
GFR calc Af Amer: >60 mL/min (ref >60)
GFR calc non Af Amer: 59 mL/min — ABNORMAL LOW (ref >60)
Glucose, Bld: 102 mg/dL — ABNORMAL HIGH (ref 70–99)
Potassium: 4.3 mmol/L (ref 3.5–5.1)
Sodium: 145 mmol/L (ref 135–145)

## 2020-03-05 LAB — BRAIN NATRIURETIC PEPTIDE: B Natriuretic Peptide: 149.2 pg/mL — ABNORMAL HIGH (ref 0.0–100.0)

## 2020-03-05 NOTE — Telephone Encounter (Signed)
Pt said for 3 days both pts lower legs and feet have been hurting with sharp pain in both lower legs especially behind the knees. Legs appear very swollen,pinkish in color; both feet hurt and are red on top of feet with swelling. Pt keeps her feet up when sitting.No CP or SOB. Explained could be lots of different things but concerned about blood clot (usually not in both legs but has happened) and cellulitis. Need eval to see what exactly is causing, pain, swelling and discomfort. No CP or SOB. Pt agreed to go to [facility] ED.

## 2020-03-05 NOTE — ED Triage Notes (Addendum)
Pt sent by her PCP for further evaluation of intermittent shortness of breath and BLE swelling x 3 days

## 2020-03-06 ENCOUNTER — Ambulatory Visit (HOSPITAL_BASED_OUTPATIENT_CLINIC_OR_DEPARTMENT_OTHER)
Admission: RE | Admit: 2020-03-06 | Discharge: 2020-03-06 | Disposition: A | Discharge status: Home / Self Care | Payer: PPO | Source: Ambulatory Visit | Attending: Emergency Medicine | Admitting: Emergency Medicine

## 2020-03-06 DIAGNOSIS — M7989 Other specified soft tissue disorders: Secondary | ICD-10-CM | POA: Diagnosis not present

## 2020-03-06 DIAGNOSIS — M79609 Pain in unspecified limb: Secondary | ICD-10-CM

## 2020-03-06 LAB — TROPONIN I (HIGH SENSITIVITY): Troponin I (High Sensitivity): 3 ng/L (ref <18)

## 2020-03-06 MED ORDER — FUROSEMIDE 20 MG PO TABS
20.0000 mg | ORAL_TABLET | Freq: Once | ORAL | Status: AC
  Administered 2020-03-06: 20 mg via ORAL
  Filled 2020-03-06: qty 1

## 2020-03-06 MED ORDER — FUROSEMIDE 20 MG PO TABS
20.0000 mg | ORAL_TABLET | Freq: Every day | ORAL | 0 refills | Status: DC | PRN
Start: 2020-03-06 — End: 2020-05-20

## 2020-03-06 NOTE — ED Provider Notes (Signed)
[facility] EMERGENCY DEPARTMENT Provider Note   CSN: [id number] Arrival date & time: 03/05/20  1818     History Chief Complaint  Patient presents with  . Shortness of Breath    Cassandra Benson is a 79 y.o. female.   Leg Pain Location:  Ankle and foot Injury: no   Ankle location:  R ankle and L ankle Foot location:  L foot and R foot Pain details:    Quality:  Aching and pressure   Radiates to:  Does not radiate   Severity:  Mild   Duration:  3 days   Timing:  Constant   Progression:  Worsening Chronicity:  New Dislocation: no   Foreign body present:  No foreign bodies Prior injury to area:  No Relieved by:  Nothing Worsened by:  Nothing Ineffective treatments:  None tried Associated symptoms: swelling   Associated symptoms: no back pain        Past Medical History:  Diagnosis Date  . Allergy   . ANA positive    positive ANA pattern 1 speckled  . Arthritis   . Carotid stenosis, asymptomatic 06/19/2015   2-40% RICA 97-35% LICA rpt 1 yr (11/2990)   . Colon polyps   . Dermatomyositis ([place])   . Diabetes mellitus without complication (HCC)    Type 1  . Diverticulosis    sigmoid on CT scan 12/2019  . Family history of adverse reaction to anesthesia    brothr went into cardiac arrest from anectine  . Fibromyalgia    prior PCP  . GERD (gastroesophageal reflux disease)    prior PCP  . Glaucoma    Narrow angle  . History of blood clots    DVT, in 20s, none since  . History of chicken pox   . History of diverticulitis   . History of pericarditis 1986   with hospitalization  . History of pneumonia 2014  . History of shingles   . History of UTI   . Hyperlipidemia   . Hypertension   . Hypothyroidism   . Mixed connective tissue disease ([place])   . Partial small bowel obstruction ([place]) 12/2019   managed conservatively  . Peptic ulcer   . Pneumonia   . PONV (postoperative nausea and vomiting)   . Raynaud's disease without gangrene   .  Shoulder pain left   h/o RTC tendonitis and adhesive capsulitis  . Sjogren's syndrome ([place])   . Sleep apnea    prior PCP - no CPAP for about 10 yrs  . 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[place])   . Vitamin D deficiency    prior PCP    Patient Active Problem List   Diagnosis Date Noted  . Bowel habit changes 02/18/2020  . Diverticulosis   . Partial small bowel obstruction ([place]) 01/15/2020  . Epistaxis 01/13/2020  . Cough 01/05/2020  . MCI (mild cognitive impairment) with memory loss 10/22/2019  . Right sided abdominal pain 10/07/2019  . Skin lesion 09/14/2019  . Renal insufficiency 09/14/2019  . Pain in right buttock 06/18/2019  . Chronic midline thoracic back pain 05/14/2019  . Sjogren's syndrome without extraglandular involvement ([place]) 02/26/2019  . Epigastric discomfort 12/17/2018  . PAD (peripheral artery disease) ([place]) 10/04/2018  . Raynaud disease 09/04/2018  . Amaurosis fugax 06/18/2018  . TIA (transient ischemic attack) 05/21/2018  . 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 Facial paresthesia 02/07/2018  . Pain and swelling of lower leg 12/25/2017  . Fibromyalgia   . 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  Intractable episodic headache 03/13/2017  . Numbness and tingling of both lower extremities 03/13/2017  . 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 Fatty liver 02/13/2017  . 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 Medicare annual wellness visit, subsequent 06/19/2015  . Health maintenance examination 06/19/2015  . Advanced care planning/counseling discussion 06/19/2015  . 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 Vitamin D deficiency   . Family history of premature CAD 06/02/2015  . Chest pain 05/26/2015  . 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 Hyperlipidemia associated with type 2 diabetes mellitus ([place]) 07/30/2014  . Essential hypertension 07/17/2014  . Hypothyroidism due  to Hashimoto's thyroiditis 07/17/2014  . Adjustment disorder with mixed anxiety and depressed mood 07/17/2014  . Apnea, sleep 08/22/2012  . LBP (low back pain) 02/27/2012  . Positive ANA (antinuclear antibody) 01/06/2012    Past Surgical History:  Procedure Laterality Date  . ABDOMINAL HYSTERECTOMY  1978   fibroids and menorrhagia, ovaries remain  . ARTERY BIOPSY Right 04/06/2018   Procedure: BIOPSY TEMPORAL ARTERY RIGHT;  Surgeon: Beverly Gust, MD;  Location: [facility];  Service: ENT;  Laterality: Right;  Diabetic - insulin pump sleep apnea  . [facility] normal per patient  . COLONOSCOPY  10/2011   1 TA, 1 HP, very tortuous colon ([place])  . COLONOSCOPY WITH ESOPHAGOGASTRODUODENOSCOPY (EGD)  03/2007   2 ulcers, benign polyp, rpt 5 yrs ([place], [place])  . JOINT REPLACEMENT Right    hip  . PARTIAL HIP ARTHROPLASTY  2013   Right hip replacement  . TONSILLECTOMY    . TONSILLECTOMY AND ADENOIDECTOMY    . TRANSCAROTID ARTERY REVASCULARIZATION Left 07/23/2018   Procedure: TRANSCAROTID ARTERY REVASCULARIZATION;  Surgeon: Marty Heck, MD;  Location: [facility];  Service: Vascular;  Laterality: Left;  . TUBAL LIGATION    . VAGINAL DELIVERY     x2, no complications     OB History   No obstetric history on file.     Family History  Problem Relation Age of Onset  . CAD Mother 75       MI, aortic valve issues  . COPD Mother   . Lupus Mother   . Graves' disease Mother   . Rheum arthritis Mother   . CAD Father 69       CABG x2, aortic valve replacement  . Stroke Sister   . CAD Sister   . Anuerysm Sister        brain  . Lupus Sister   . Diabetes Sister   . Alcohol abuse Brother   . CAD Brother 51       MI  . Stroke Brother   . Seizures Son   . COPD Brother        agent orange  . CAD Brother 34       stent  . Diabetes Brother   . Depression Grandchild   . Breast cancer Maternal Aunt   . Diabetes Sister   .  Breast cancer Sister   . Breast cancer Maternal Aunt   . Stroke Maternal Grandmother   . Hypertension Maternal Grandmother   . Gallbladder disease Maternal Grandmother     Social History   Tobacco Use  . Smoking status: Never Smoker  . Smokeless tobacco: Never Used  Vaping Use  . Vaping Use: Never used  Substance Use Topics  . Alcohol use: No  . Drug use:  No    Home Medications Prior to Admission medications   Medication Sig Start Date End Date Taking? Authorizing Provider  acetaminophen (TYLENOL) 500 MG tablet Take 1 tablet (500 mg total) by mouth 3 (three) times daily as needed. 01/03/20  Yes [name], MD  Ascorbic Acid (VITAMIN C) 100 MG tablet Take 100 mg by mouth daily.   Yes [provider]  aspirin EC 81 MG tablet Take 81 mg by mouth daily.   Yes [provider]  clopidogrel (PLAVIX) 75 MG tablet Take 1 tablet (75 mg total) by mouth daily. 09/03/19  Yes Marty Heck, MD  diclofenac sodium (VOLTAREN) 1 % GEL Apply 1 application topically 3 (three) times daily. 01/05/18  Yes [name], MD  glucagon 1 MG injection Inject 1 mg into the muscle once as needed for up to 1 dose. 01/24/20  Yes Philemon Kingdom, MD  Insulin Human (INSULIN PUMP) SOLN Inject into the skin continuous. novolog   Yes [provider]  Javier Docker Oil 500 MG CAPS Take 2 capsules (1,000 mg total) by mouth daily. Patient taking differently: Take 500 mg by mouth daily.  09/20/17  Yes [name], MD  losartan (COZAAR) 25 MG tablet Take 1 tablet (25 mg total) by mouth daily. 02/18/20  Yes [name], MD  memantine (NAMENDA) 5 MG tablet Take 1 tablet (5 mg total) by mouth 2 (two) times daily. 02/07/20  Yes [name], MD  metoprolol succinate (TOPROL-XL) 50 MG 24 hr tablet Take 50 mg by mouth at bedtime. Take with or immediately following a meal.   Yes [provider]  nitroGLYCERIN (NITROSTAT) 0.4 MG SL tablet Place 1 tablet (0.4 mg total) under  the tongue every 5 (five) minutes as needed for chest pain. 07/31/18  Yes [name], MD  rosuvastatin (CRESTOR) 10 MG tablet Take 1 tablet (10 mg total) by mouth daily. 09/03/19  Yes Marty Heck, MD  SYNTHROID 75 MCG tablet TAKE 1 TABLET BY MOUTH ONCE DAILY FOR 6 DAYS AND 1 AND 1/2 TABLETS ON THE 7TH DAY Patient taking differently: Take 75 mcg by mouth See admin instructions. Take 1 tablet daily except do not take on Sunday 09/13/19  Yes [name], MD  BD ULTRA-FINE LANCETS lancets Use as instructed upto 6 times daily 09/02/14   Phadke, Karsten Ro, MD  Continuous Blood Gluc Receiver (DEXCOM G6 RECEIVER) DEVI 1 kit by Does not apply route See admin instructions. Use to check blood sugar 4 times a day 09/18/19   Philemon Kingdom, MD  Continuous Blood Gluc Sensor (DEXCOM G6 SENSOR) MISC 1 each by Does not apply route See admin instructions. Change sensor every 10 days 09/18/19   Philemon Kingdom, MD  Continuous Blood Gluc Transmit (DEXCOM G6 TRANSMITTER) MISC 1 kit by Does not apply route See admin instructions. Use to check blood sugar 09/18/19   Philemon Kingdom, MD  furosemide (LASIX) 20 MG tablet Take 1 tablet (20 mg total) by mouth daily as needed for edema. 03/06/20   Aylyn Wenzler, Corene Cornea, MD  glucose blood (CONTOUR NEXT TEST) test strip Use as instructed to check 4 times daily 07/03/17   Philemon Kingdom, MD  insulin aspart (NOVOLOG) 100 UNIT/ML injection Use up to 50 units a day in the insulin pump Patient not taking: Reported on 03/06/2020 12/10/19   Philemon Kingdom, MD  Insulin Pen Needle 32G X 4 MM MISC Use 4-5x a day 11/06/18   Philemon Kingdom, MD    Allergies    Iodinated diagnostic agents,  Penicillins, Amlodipine, Anectine [succinylcholine], Codeine, Gabapentin, Influenza vaccines, Nortriptyline, Pamelor [nortriptyline hcl], Valsartan, Erythromycin, and Sulfa antibiotics  Review of Systems   Review of Systems  Musculoskeletal: Negative for back pain.  All other  systems reviewed and are negative.   Physical Exam Updated Vital Signs BP 139/72   Pulse 63   Temp 98.3 F (36.8 C) (Oral)   Resp 15   Ht 5' 3.5" (1.613 m)   Wt 70.8 kg   SpO2 100%   BMI 27.20 kg/m   Physical Exam Vitals and nursing note reviewed.  Constitutional:      Appearance: She is well-developed.  HENT:     Head: Normocephalic and atraumatic.  Cardiovascular:     Rate and Rhythm: Normal rate and regular rhythm.  Pulmonary:     Effort: No respiratory distress.     Breath sounds: No stridor. No decreased breath sounds, wheezing or rales.  Chest:     Chest wall: No tenderness or edema.  Abdominal:     General: There is no distension.  Musculoskeletal:     Cervical back: Normal range of motion.     Right lower leg: No tenderness. Edema (ankles and dorsal feet) present.     Left lower leg: No tenderness. Edema present.  Neurological:     General: No focal deficit present.     Mental Status: She is alert.     ED Results / Procedures / Treatments   Labs (all labs ordered are listed, but only abnormal results are displayed) Labs Reviewed  BASIC METABOLIC PANEL - Abnormal; Notable for the following components:      Result Value   Chloride 112 (*)    Glucose, Bld 102 (*)    GFR calc non Af Amer 59 (*)    All other components within normal limits  BRAIN NATRIURETIC PEPTIDE - Abnormal; Notable for the following components:   B Natriuretic Peptide 149.2 (*)    All other components within normal limits  CBC  TROPONIN I (HIGH SENSITIVITY)  TROPONIN I (HIGH SENSITIVITY)    EKG EKG Interpretation  Date/Time:  Thursday March 05 2020 18:25:02 EDT Ventricular Rate:  68 PR Interval:  186 QRS Duration: 78 QT Interval:  394 QTC Calculation: 418 R Axis:   37 Text Interpretation: Normal sinus rhythm Low voltage QRS Cannot rule out Anterior infarct , age undetermined Abnormal ECG No acute changes Confirmed by Merrily Pew [phone]) on 03/05/2020 11:48:09  PM   Radiology DG Chest 2 View  Result Date: 03/05/2020 CLINICAL DATA:  Left leg and left foot swelling. EXAM: CHEST - 2 VIEW COMPARISON:  November 25, 2019 FINDINGS: There is no evidence of acute infiltrate, pleural effusion or pneumothorax. The heart size and mediastinal contours are within normal limits. The visualized skeletal structures are unremarkable. IMPRESSION: No active cardiopulmonary disease. Electronically Signed   By: Virgina Norfolk M.D.   On: 03/05/2020 19:01    Procedures Procedures (including critical care time)  Medications Ordered in ED Medications  furosemide (LASIX) tablet 20 mg (20 mg Oral Given 03/06/20 0031)    ED Course  I have reviewed the triage vital signs and the nursing notes.  Pertinent labs & imaging results that were available during my care of the patient were reviewed by me and considered in my medical decision making (see chart for details).    MDM Rules/Calculators/A&P  Patient with possible new onset heart failure.  She has a slightly elevated BNP, lower extremity edema.  She does states she has some shortness of breath but no chest pain and she does not have any evidence, here.  She has no tachycardia, hypoxia or tachypnea.  EKG and x-ray are unremarkable.  I will have her come back to get DVT studies tomorrow but once again I do not think this is actually thromboembolism related.  Low suspicion for ACS.  Will start on PRN/intermittent Lasix and follow-up with cardiology for further management.  Final Clinical Impression(s) / ED Diagnoses Final diagnoses:  Edema, unspecified type    Rx / DC Orders ED Discharge Orders         Ordered    LE VENOUS        03/06/20 0213    furosemide (LASIX) 20 MG tablet  Daily PRN     Discontinue  Reprint     03/06/20 0213           [place] Pfund, Corene Cornea, MD 03/06/20 [phone]

## 2020-03-08 ENCOUNTER — Encounter: Payer: Self-pay | Admitting: Family Medicine

## 2020-03-09 NOTE — Telephone Encounter (Signed)
Noted. Seen at ER with negative DVT evaluation.  Thought CHF related, rec start lasix and f/u with cards.  See mychart message.

## 2020-03-10 ENCOUNTER — Telehealth: Payer: Self-pay

## 2020-03-10 NOTE — Telephone Encounter (Signed)
Triage call received from pt reports BLE swelling and pain. She had a LE venous US on 03/06/20- was negative for DVT. States her PCP recommended she f/u with Dr. Carlis Abbott. Pt denies wearing compression socks- states didn't have access to get from store. Pt preferred to wait to be scheduled with Dr. Carlis Abbott. Pt scheduled for 04/07/20 at 1100. Voiced understanding.

## 2020-03-11 ENCOUNTER — Encounter: Payer: Self-pay | Admitting: Internal Medicine

## 2020-03-15 MED ORDER — DICLOFENAC SODIUM 1 % EX GEL
2.0000 g | Freq: Three times a day (TID) | CUTANEOUS | 3 refills | Status: DC
Start: 2020-03-15 — End: 2022-05-09

## 2020-03-15 NOTE — Addendum Note (Signed)
Addended by: [name] on: 03/15/2020 12:28 PM   Modules accepted: Orders

## 2020-03-16 ENCOUNTER — Other Ambulatory Visit: Payer: Self-pay

## 2020-03-16 ENCOUNTER — Ambulatory Visit (INDEPENDENT_AMBULATORY_CARE_PROVIDER_SITE_OTHER): Payer: PPO | Admitting: Family Medicine

## 2020-03-16 ENCOUNTER — Encounter: Payer: Self-pay | Admitting: Family Medicine

## 2020-03-16 VITALS — BP 140/70 | HR 77 | Temp 97.6°F | Ht 63.5 in | Wt 158.3 lb

## 2020-03-16 DIAGNOSIS — M3500 Sicca syndrome, unspecified: Secondary | ICD-10-CM | POA: Diagnosis not present

## 2020-03-16 DIAGNOSIS — M79669 Pain in unspecified lower leg: Secondary | ICD-10-CM

## 2020-03-16 DIAGNOSIS — G3184 Mild cognitive impairment, so stated: Secondary | ICD-10-CM

## 2020-03-16 DIAGNOSIS — M546 Pain in thoracic spine: Secondary | ICD-10-CM

## 2020-03-16 DIAGNOSIS — G8929 Other chronic pain: Secondary | ICD-10-CM

## 2020-03-16 DIAGNOSIS — M79661 Pain in right lower leg: Secondary | ICD-10-CM

## 2020-03-16 DIAGNOSIS — M7989 Other specified soft tissue disorders: Secondary | ICD-10-CM | POA: Diagnosis not present

## 2020-03-16 DIAGNOSIS — I739 Peripheral vascular disease, unspecified: Secondary | ICD-10-CM

## 2020-03-16 DIAGNOSIS — I10 Essential (primary) hypertension: Secondary | ICD-10-CM | POA: Diagnosis not present

## 2020-03-16 DIAGNOSIS — M797 Fibromyalgia: Secondary | ICD-10-CM

## 2020-03-16 DIAGNOSIS — R2243 Localized swelling, mass and lump, lower limb, bilateral: Secondary | ICD-10-CM | POA: Diagnosis not present

## 2020-03-16 MED ORDER — PREGABALIN 25 MG PO CAPS
25.0000 mg | ORAL_CAPSULE | Freq: Two times a day (BID) | ORAL | 1 refills | Status: DC
Start: 2020-03-16 — End: 2020-04-27

## 2020-03-16 NOTE — Assessment & Plan Note (Addendum)
BP mildly elevated - will continue to monitor for now on current regimen of toprol XL 50mg  daily, losartan 25mg  daily, with lasix 20mg  QD PRN

## 2020-03-16 NOTE — Assessment & Plan Note (Signed)
Possible FM flare contributing to diffuse body aches, mental fog. Intolerant of gabapentin, TCAs in the past. Doesn't remember trying cymbalta in the past. She was previously on lyrica, unclear why it was remotely stopped. Will restart low dose lyrica 25mg  daily with option to increase to BID if tolerated. If this doesn't affect chest discomfort, she will call cards for sooner f/u.

## 2020-03-16 NOTE — Assessment & Plan Note (Signed)
Bilateral mild dependent pedal edema, despite lasix 20mg  QD PRN. Doubt CHF related but rather due to CVI - she is planning on seeing [facility] clinic to be fitted for compression stockings and has [facility] f/u scheduled for next month.

## 2020-03-16 NOTE — Assessment & Plan Note (Signed)
Reassuring eval in the past. ?FM related.

## 2020-03-16 NOTE — Patient Instructions (Signed)
Let's try lyrica 25mg  daily for 1 week then may increase to 25mg  twice daily.  Try compression stockings. Keep follow up with Dr Carlis Abbott next month. If no improvement on chest pain or shortness of breath, schedule follow up with cardiology.  Good to see you today. Return in 4-6 weeks for follow up visit.

## 2020-03-16 NOTE — Assessment & Plan Note (Signed)
Known PAD 

## 2020-03-16 NOTE — Assessment & Plan Note (Signed)
?  FM related mental fog

## 2020-03-16 NOTE — Assessment & Plan Note (Signed)
Planning to establish with new rheum Dr Posey Pronto at [facility]. ANA+ but not consistent with lupus previously. Rather she has Sjogren's syndrome, sicca syndrome, raynaud's and fibromyalgia.

## 2020-03-16 NOTE — Progress Notes (Signed)
This visit was conducted in person.  BP 140/70 (BP Location: Left Arm, Patient Position: Sitting, Cuff Size: Normal)   Pulse 77   Temp 97.6 F (36.4 C) (Temporal)   Ht 5' 3.5" (1.613 m)   Wt 158 lb 5 oz (71.8 kg)   SpO2 97%   BMI 27.60 kg/m   BP Readings from Last 3 Encounters:  03/16/20 140/70  03/06/20 139/72  02/18/20 128/82    CC: body aches, foot swelling.  Subjective:    Patient ID: Cassandra Benson, female    DOB: [birth year], 79 y.o.   MRN: [id number]  HPI: Cassandra Benson is a 79 y.o. female presenting on 03/16/2020 for Generalized Body Aches (C/o all over achiness.  Started 03/14/20.  Thinks due to lupus.) and Foot Swelling (C/o bilateral feet swelling, left worse. )   Recent ER visit for edema and body aches associated with shortness of breath - no chest pain. EKG and CXR were ok as were BLE venous ultrasound. BNP 149. Placed on lasix 79m PRN, rec cardiology f/u. 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   Legs remain swollen.  Planning to pick up OTC voltaren gel.   She has ongoing substernal chest pain/pressure which she attributes to lupus that happens about 1-2 times a week occurs at rest improves over time. She also notes exertional dyspnea. No heavy lifting or straining. Ongoing fatigue.   Has scheduled appt with Dr CCarlis Abbottfor 04/07/2020.  Last cardiology appt 01/2020.   Has seen rheum - dx sjogren's, sicca, fibromyalgia, and raynaud's not lupus. [facility]   Echo 09/2017 Left ventricle: The cavity size was normal. There was mild  concentric hypertrophy. Systolic function was normal. The  estimated ejection fraction was in the range of 60% to 65%. Wall  motion was normal; there were no regional wall motion  abnormalities. Doppler parameters are consistent with abnormal  left ventricular relaxation (grade 1 diastolic dysfunction).      Relevant past medical, surgical,  family and social history reviewed and updated as indicated. Interim medical history since our last visit reviewed. Allergies and medications reviewed and updated. Outpatient Medications Prior to Visit  Medication Sig Dispense Refill  . acetaminophen (TYLENOL) 500 MG tablet Take 1 tablet (500 mg total) by mouth 3 (three) times daily as needed.    . Ascorbic Acid (VITAMIN C) 100 MG tablet Take 100 mg by mouth daily.    .Marland Kitchenaspirin EC 81 MG tablet Take 81 mg by mouth daily.    . BD ULTRA-FINE LANCETS lancets Use as instructed upto 6 times daily 600 each 2  . clopidogrel (PLAVIX) 75 MG tablet Take 1 tablet (75 mg total) by mouth daily. 30 tablet 6  . Continuous Blood Gluc Receiver (DEXCOM G6 RECEIVER) DEVI 1 kit by Does not apply route See admin instructions. Use to check blood sugar 4 times a day 1 each 0  . Continuous Blood Gluc Sensor (DEXCOM G6 SENSOR) MISC 1 each by Does not apply route See admin instructions. Change sensor every 10 days 3 each 3  . Continuous Blood Gluc Transmit (DEXCOM G6 TRANSMITTER) MISC 1 kit by Does not apply route See admin instructions. Use to check blood sugar 1 each 1  . diclofenac Sodium (VOLTAREN) 1 % GEL Apply 2 g topically 3 (three) times daily. 100 g 3  . furosemide (LASIX) 20 MG tablet Take 1 tablet (20 mg  total) by mouth daily as needed for edema. 15 tablet 0  . glucagon 1 MG injection Inject 1 mg into the muscle once as needed for up to 1 dose. 1 each 12  . glucose blood (CONTOUR NEXT TEST) test strip Use as instructed to check 4 times daily 400 each 5  . insulin aspart (NOVOLOG) 100 UNIT/ML injection Use up to 50 units a day in the insulin pump 50 mL 3  . Insulin Human (INSULIN PUMP) SOLN Inject into the skin continuous. novolog    . Insulin Pen Needle 32G X 4 MM MISC Use 4-5x a day 300 each 3  . Krill Oil 500 MG CAPS Take 2 capsules (1,000 mg total) by mouth daily. (Patient taking differently: Take 500 mg by mouth daily. )    . losartan (COZAAR) 25 MG tablet  Take 1 tablet (25 mg total) by mouth daily. 30 tablet 11  . memantine (NAMENDA) 5 MG tablet Take 1 tablet (5 mg total) by mouth 2 (two) times daily. 60 tablet 3  . metoprolol succinate (TOPROL-XL) 50 MG 24 hr tablet Take 50 mg by mouth at bedtime. Take with or immediately following a meal.    . nitroGLYCERIN (NITROSTAT) 0.4 MG SL tablet Place 1 tablet (0.4 mg total) under the tongue every 5 (five) minutes as needed for chest pain. 30 tablet 1  . rosuvastatin (CRESTOR) 10 MG tablet Take 1 tablet (10 mg total) by mouth daily. 30 tablet 5  . SYNTHROID 75 MCG tablet TAKE 1 TABLET BY MOUTH ONCE DAILY FOR 6 DAYS AND 1 AND 1/2 TABLETS ON THE 7TH DAY (Patient taking differently: Take 75 mcg by mouth See admin instructions. Take 1 tablet daily except do not take on Sunday) 105 tablet 3   No facility-administered medications prior to visit.     Per HPI unless specifically indicated in ROS section below Review of Systems Objective:  BP 140/70 (BP Location: Left Arm, Patient Position: Sitting, Cuff Size: Normal)   Pulse 77   Temp 97.6 F (36.4 C) (Temporal)   Ht 5' 3.5" (1.613 m)   Wt 158 lb 5 oz (71.8 kg)   SpO2 97%   BMI 27.60 kg/m   Wt Readings from Last 3 Encounters:  03/16/20 158 lb 5 oz (71.8 kg)  03/05/20 156 lb (70.8 kg)  02/18/20 156 lb 6 oz (70.9 kg)      Physical Exam Vitals and nursing note reviewed.  Constitutional:      Appearance: Normal appearance. She is not ill-appearing.  Cardiovascular:     Rate and Rhythm: Normal rate and regular rhythm.     Pulses: Normal pulses.     Heart sounds: Normal heart sounds. No murmur heard.   Pulmonary:     Effort: Pulmonary effort is normal. No respiratory distress.     Breath sounds: Normal breath sounds. No wheezing, rhonchi or rales.  Musculoskeletal:        General: Tenderness present.     Right lower leg: Edema (tr) present.     Left lower leg: Edema (tr) present.     Comments: 2+ DP bilaterally  Skin:    General: Skin is warm  and dry.     Findings: No rash.  Neurological:     Mental Status: She is alert.  Psychiatric:        Mood and Affect: Mood normal.        Behavior: Behavior normal.       Results for orders placed or performed  during the hospital encounter of 17/91/50  Basic metabolic panel  Result Value Ref Range   Sodium 145 135 - 145 mmol/L   Potassium 4.3 3.5 - 5.1 mmol/L   Chloride 112 (H) 98 - 111 mmol/L   CO2 24 22 - 32 mmol/L   Glucose, Bld 102 (H) 70 - 99 mg/dL   BUN 10 8 - 23 mg/dL   Creatinine, Ser 0.93 0.44 - 1.00 mg/dL   Calcium 9.7 8.9 - 10.3 mg/dL   GFR calc non Af Amer 59 (L) >60 mL/min   GFR calc Af Amer >60 >60 mL/min   Anion gap 9 5 - 15  CBC  Result Value Ref Range   WBC 9.6 4.0 - 10.5 K/uL   RBC 5.01 3.87 - 5.11 MIL/uL   Hemoglobin 13.2 12.0 - 15.0 g/dL   HCT 43.2 36 - 46 %   MCV 86.2 80.0 - 100.0 fL   MCH 26.3 26.0 - 34.0 pg   MCHC 30.6 30.0 - 36.0 g/dL   RDW 14.4 11.5 - 15.5 %   Platelets 227 150 - 400 K/uL   nRBC 0.0 0.0 - 0.2 %  Brain natriuretic peptide  Result Value Ref Range   B Natriuretic Peptide 149.2 (H) 0.0 - 100.0 pg/mL  Troponin I (High Sensitivity)  Result Value Ref Range   Troponin I (High Sensitivity) 3 <18 ng/L   Assessment & Plan:  This visit occurred during the SARS-CoV-2 public health emergency.  Safety protocols were in place, including screening questions prior to the visit, additional usage of staff PPE, and extensive cleaning of exam room while observing appropriate contact time as indicated for disinfecting solutions.   Problem List Items Addressed This Visit    Sjogren's syndrome without extraglandular involvement ([place])    Planning to establish with new rheum Dr Posey Pronto at [facility] ANA+ but not consistent with lupus previously. Rather she has Sjogren's syndrome, sicca syndrome, raynaud's and fibromyalgia.       Pain and swelling of lower leg - Primary    Bilateral mild dependent pedal edema, despite lasix 81m QD PRN. Doubt CHF  related but rather due to CVI - she is planning on seeing [facility] clinic to be fitted for compression stockings and has [facility] f/u scheduled for next month.       PAD (peripheral artery disease) (HCC)    Known PAD.       MCI (mild cognitive impairment)    ?FM related mental fog      Fibromyalgia    Possible FM flare contributing to diffuse body aches, mental fog. Intolerant of gabapentin, TCAs in the past. Doesn't remember trying cymbalta in the past. She was previously on lyrica, unclear why it was remotely stopped. Will restart low dose lyrica 276mdaily with option to increase to BID if tolerated. If this doesn't affect chest discomfort, she will call cards for sooner f/u.       Relevant Medications   pregabalin (LYRICA) 25 MG capsule   Essential hypertension    BP mildly elevated - will continue to monitor for now on current regimen of toprol XL 5020maily, losartan 17m46mily, with lasix 20mg16mPRN      Chronic midline thoracic back pain    Reassuring eval in the past. ?FM related.       Relevant Medications   pregabalin (LYRICA) 25 MG capsule       Meds ordered this encounter  Medications  . pregabalin (LYRICA) 25 MG capsule  Sig: Take 1 capsule (25 mg total) by mouth 2 (two) times daily. Take once daily for the first week    Dispense:  60 capsule    Refill:  1   No orders of the defined types were placed in this encounter.   Patient Instructions  Let's try lyrica 66m daily for 1 week then may increase to 248mtwice daily.  Try compression stockings. Keep follow up with Dr ClCarlis Abbottext month. If no improvement on chest pain or shortness of breath, schedule follow up with cardiology.  Good to see you today. Return in 4-6 weeks for follow up visit.    Follow up plan: Return in about 6 weeks (around 04/27/2020) for follow up visit.  JaRia BushMD

## 2020-03-20 ENCOUNTER — Encounter: Payer: Self-pay | Admitting: Internal Medicine

## 2020-03-23 ENCOUNTER — Other Ambulatory Visit: Payer: Self-pay | Admitting: Internal Medicine

## 2020-03-23 ENCOUNTER — Ambulatory Visit (INDEPENDENT_AMBULATORY_CARE_PROVIDER_SITE_OTHER): Payer: PPO

## 2020-03-23 DIAGNOSIS — Z1159 Encounter for screening for other viral diseases: Secondary | ICD-10-CM

## 2020-03-23 LAB — SARS CORONAVIRUS 2 (TAT 6-24 HRS): SARS Coronavirus 2: NEGATIVE

## 2020-03-25 ENCOUNTER — Encounter: Payer: Self-pay | Admitting: Internal Medicine

## 2020-03-25 ENCOUNTER — Other Ambulatory Visit: Payer: Self-pay

## 2020-03-25 ENCOUNTER — Ambulatory Visit (AMBULATORY_SURGERY_CENTER): Payer: PPO | Admitting: Internal Medicine

## 2020-03-25 VITALS — BP 109/58 | HR 61 | Temp 97.1°F | Resp 11 | Ht 63.0 in | Wt 158.0 lb

## 2020-03-25 DIAGNOSIS — Z86010 Personal history of colonic polyps: Secondary | ICD-10-CM

## 2020-03-25 DIAGNOSIS — D125 Benign neoplasm of sigmoid colon: Secondary | ICD-10-CM

## 2020-03-25 DIAGNOSIS — Z8719 Personal history of other diseases of the digestive system: Secondary | ICD-10-CM

## 2020-03-25 DIAGNOSIS — D122 Benign neoplasm of ascending colon: Secondary | ICD-10-CM

## 2020-03-25 DIAGNOSIS — K5140 Inflammatory polyps of colon without complications: Secondary | ICD-10-CM | POA: Diagnosis not present

## 2020-03-25 DIAGNOSIS — K56609 Unspecified intestinal obstruction, unspecified as to partial versus complete obstruction: Secondary | ICD-10-CM | POA: Diagnosis not present

## 2020-03-25 DIAGNOSIS — K51412 Inflammatory polyps of colon with intestinal obstruction: Secondary | ICD-10-CM | POA: Diagnosis not present

## 2020-03-25 MED ORDER — SODIUM CHLORIDE 0.9 % IV SOLN: 500.0000 mL | Freq: Once | INTRAVENOUS | Status: DC

## 2020-03-25 NOTE — Patient Instructions (Signed)
Discharge instructions given. Handouts on polyps,diverticulosis and hemorrhoids. Resume previous medications. Resume Plavix tomorrow. YOU HAD AN ENDOSCOPIC PROCEDURE TODAY AT [facility]:   Refer to the procedure report that was given to you for any specific questions about what was found during the examination.  If the procedure report does not answer your questions, please call your gastroenterologist to clarify.  If you requested that your care partner not be given the details of your procedure findings, then the procedure report has been included in a sealed envelope for you to review at your convenience later.  YOU SHOULD EXPECT: Some feelings of bloating in the abdomen. Passage of more gas than usual.  Walking can help get rid of the air that was put into your GI tract during the procedure and reduce the bloating. If you had a lower endoscopy (such as a colonoscopy or flexible sigmoidoscopy) you may notice spotting of blood in your stool or on the toilet paper. If you underwent a bowel prep for your procedure, you may not have a normal bowel movement for a few days.  Please Note:  You might notice some irritation and congestion in your nose or some drainage.  This is from the oxygen used during your procedure.  There is no need for concern and it should clear up in a day or so.  SYMPTOMS TO REPORT IMMEDIATELY:   Following lower endoscopy (colonoscopy or flexible sigmoidoscopy):  Excessive amounts of blood in the stool  Significant tenderness or worsening of abdominal pains  Swelling of the abdomen that is new, acute  Fever of 100F or higher   For urgent or emergent issues, a gastroenterologist can be reached at any hour by calling [phone]. Do not use MyChart messaging for urgent concerns.    DIET:  We do recommend a small meal at first, but then you may proceed to your regular diet.  Drink plenty of fluids but you should avoid alcoholic beverages for 24  hours.  ACTIVITY:  You should plan to take it easy for the rest of today and you should NOT DRIVE or use heavy machinery until tomorrow (because of the sedation medicines used during the test).    FOLLOW UP: Our staff will call the number listed on your records 48-72 hours following your procedure to check on you and address any questions or concerns that you may have regarding the information given to you following your procedure. If we do not reach you, we will leave a message.  We will attempt to reach you two times.  During this call, we will ask if you have developed any symptoms of COVID 19. If you develop any symptoms (ie: fever, flu-like symptoms, shortness of breath, cough etc.) before then, please call [phone].  If you test positive for Covid 19 in the 2 weeks post procedure, please call and report this information to [place].    If any biopsies were taken you will be contacted by phone or by letter within the next 1-3 weeks.  Please call us at [phone] if you have not heard about the biopsies in 3 weeks.    SIGNATURES/CONFIDENTIALITY: You and/or your care partner have signed paperwork which will be entered into your electronic medical record.  These signatures attest to the fact that that the information above on your After Visit Summary has been reviewed and is understood.  Full responsibility of the confidentiality of this discharge information lies with you and/or your care-partner.

## 2020-03-25 NOTE — Progress Notes (Signed)
Called to room to assist during endoscopic procedure.  Patient ID and intended procedure confirmed with present staff. Received instructions for my participation in the procedure from the performing physician.  

## 2020-03-25 NOTE — Progress Notes (Signed)
VS by SM  Pt's states no medical or surgical changes since previsit or office visit.  

## 2020-03-25 NOTE — Progress Notes (Signed)
To PACU, V[facility]. Report to [email or web address] 

## 2020-03-25 NOTE — Op Note (Signed)
[facility] Patient Name: Cassandra Benson Procedure Date: 03/25/2020 8:05 AM MRN: [id number] Endoscopist: Jerene Bears , MD Age: 79 Referring MD:  Date of Birth: [birth year] Gender: Female Account #: [id number] Procedure:                Colonoscopy Indications:              High risk colon cancer surveillance: Personal                            history of non-advanced adenoma, Last colonoscopy:                            2013 Medicines:                Monitored Anesthesia Care Procedure:                Pre-Anesthesia Assessment:                           - Prior to the procedure, a History and Physical                            was performed, and patient medications and                            allergies were reviewed. The patient's tolerance of                            previous anesthesia was also reviewed. The risks                            and benefits of the procedure and the sedation                            options and risks were discussed with the patient.                            All questions were answered, and informed consent                            was obtained. Prior Anticoagulants: The patient has                            taken Plavix (clopidogrel), last dose was 5 days                            prior to procedure. ASA Grade Assessment: III - A                            patient with severe systemic disease. After                            reviewing the risks and benefits, the patient was  deemed in satisfactory condition to undergo the                            procedure.                           After obtaining informed consent, the colonoscope                            was passed under direct vision. Throughout the                            procedure, the patient's blood pressure, pulse, and                            oxygen saturations were monitored continuously. The                            Colonoscope was  introduced through the anus and                            advanced to the terminal ileum. The colonoscopy was                            performed without difficulty. The patient tolerated                            the procedure well. The quality of the bowel                            preparation was good. The terminal ileum, ileocecal                            valve, appendiceal orifice, and rectum were                            photographed. Scope In: 8:46:56 AM Scope Out: 9:05:02 AM Scope Withdrawal Time: 0 hours 12 minutes 51 seconds  Total Procedure Duration: 0 hours 18 minutes 6 seconds  Findings:                 The digital rectal exam was normal.                           The terminal ileum appeared normal.                           A 7 mm polyp was found in the ascending colon. The                            polyp was sessile. The polyp was removed with a                            cold snare. Resection and retrieval were complete.  A 3 mm polyp was found in the sigmoid colon. The                            polyp was sessile. The polyp was removed with a                            cold snare. Resection and retrieval were complete.                           Multiple small and large-mouthed diverticula were                            found in the sigmoid colon and ascending colon.                           External and internal hemorrhoids were found during                            retroflexion. The hemorrhoids were small. Complications:            No immediate complications. Estimated Blood Loss:     Estimated blood loss was minimal. Impression:               - The examined portion of the ileum was normal.                           - One 7 mm polyp in the ascending colon, removed                            with a cold snare. Resected and retrieved.                           - One 3 mm polyp in the sigmoid colon, removed with                             a cold snare. Resected and retrieved.                           - Moderate diverticulosis in the sigmoid colon and                            in the ascending colon.                           - External and internal hemorrhoids. Recommendation:           - Patient has a contact number available for                            emergencies. The signs and symptoms of potential                            delayed complications were discussed with the  patient. Return to normal activities tomorrow.                            Written discharge instructions were provided to the                            patient.                           - Resume previous diet.                           - Continue present medications.                           - Await pathology results.                           - Resume Plavix (clopidogrel) at prior dose                            tomorrow. Refer to managing physician for further                            adjustment of therapy.                           - No repeat colonoscopy due to age over 60 years at                            next surveillance interval. Jerene Bears, MD 03/25/2020 9:09:57 AM This report has been signed electronically.

## 2020-03-26 ENCOUNTER — Other Ambulatory Visit: Payer: Self-pay | Admitting: Vascular Surgery

## 2020-03-27 ENCOUNTER — Telehealth: Payer: Self-pay | Admitting: *Deleted

## 2020-03-27 ENCOUNTER — Telehealth: Payer: Self-pay

## 2020-03-27 NOTE — Telephone Encounter (Signed)
No answer for post procedure call back and unable tot leave message.

## 2020-03-27 NOTE — Telephone Encounter (Signed)
First post procedure follow up call, no answer 

## 2020-03-29 DIAGNOSIS — E109 Type 1 diabetes mellitus without complications: Secondary | ICD-10-CM | POA: Diagnosis not present

## 2020-03-31 ENCOUNTER — Encounter: Payer: Self-pay | Admitting: Internal Medicine

## 2020-04-07 ENCOUNTER — Encounter: Payer: Self-pay | Admitting: Vascular Surgery

## 2020-04-07 ENCOUNTER — Other Ambulatory Visit: Payer: Self-pay

## 2020-04-07 ENCOUNTER — Ambulatory Visit (INDEPENDENT_AMBULATORY_CARE_PROVIDER_SITE_OTHER): Payer: PPO | Admitting: Vascular Surgery

## 2020-04-07 DIAGNOSIS — I872 Venous insufficiency (chronic) (peripheral): Secondary | ICD-10-CM | POA: Insufficient documentation

## 2020-04-07 NOTE — Progress Notes (Signed)
Patient name: Cassandra Benson MRN: [id number] DOB: April 30, 1941 Sex: female  REASON FOR VISIT: Evaluate lower extremity leg swelling  HPI: Cassandra Benson is a 79 y.o. female that presents for evaluation of ongoing lower extremity leg swelling.  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  She has done well from a TCAR standpoint and on recent surveillance in December had no in-stent restenosis.  Her main complaint today is ongoing leg swelling particularly worse at the end of the day and worse in the right leg.  We previously sized her for compression stockings but she lost these and has not been wearing them.  She was seen in the ED for this and had a DVT study that was negative.  She was told she had congestive heart failure although her primary care doctor does not feel this is the case. She does complain of a lot of heaviness and achiness in the leg particularly at the end of the day.  Past Medical History:  Diagnosis Date  . Allergy   . ANA positive    positive ANA pattern 1 speckled  . Arthritis   . Carotid stenosis, asymptomatic 06/19/2015   5-99% RICA 35-70% LICA rpt 1 yr (09/7791)   . Colon polyps   . Dermatomyositis ([place])   . Diabetes mellitus without complication (HCC)    Type 1  . Diverticulosis    sigmoid on CT scan 12/2019  . Family history of adverse reaction to anesthesia    brothr went into cardiac arrest from anectine  . Fibromyalgia    prior PCP  . GERD (gastroesophageal reflux disease)    prior PCP  . Glaucoma    Narrow angle  . History of blood clots    DVT, in 20s, none since  . History of chicken pox   . History of diverticulitis   . History of pericarditis 1986   with hospitalization  . History of pneumonia 2014  . History of shingles   . History of UTI   . Hyperlipidemia   . Hypertension   . Hypothyroidism   . Mixed connective tissue disease ([place])   . Partial  small bowel obstruction ([place]) 12/2019   managed conservatively  . Peptic ulcer   . Pneumonia   . PONV (postoperative nausea and vomiting)   . Raynaud's disease without gangrene   . Shoulder pain left   h/o RTC tendonitis and adhesive capsulitis  . Sjogren's syndrome ([place])   . Sleep apnea    prior PCP - no CPAP for about 10 yrs  . 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[place])   . Vitamin D deficiency    prior PCP    Past Surgical History:  Procedure Laterality Date  . ABDOMINAL HYSTERECTOMY  1978   fibroids and menorrhagia, ovaries remain  . ARTERY BIOPSY Right 04/06/2018   Procedure: BIOPSY TEMPORAL ARTERY RIGHT;  Surgeon: Beverly Gust, MD;  Location: [facility];  Service: ENT;  Laterality: Right;  Diabetic - insulin pump sleep apnea  . [facility] normal per patient  . COLONOSCOPY  10/2011   1 TA, 1 HP, very tortuous colon ([place])  . COLONOSCOPY WITH ESOPHAGOGASTRODUODENOSCOPY (EGD)  03/2007   2 ulcers, benign polyp, rpt 5 yrs ([place], [place])  . JOINT REPLACEMENT Right    hip  . PARTIAL HIP ARTHROPLASTY  2013   Right hip replacement  . TONSILLECTOMY    .  TONSILLECTOMY AND ADENOIDECTOMY    . TRANSCAROTID ARTERY REVASCULARIZATION Left 07/23/2018   Procedure: TRANSCAROTID ARTERY REVASCULARIZATION;  Surgeon: Marty Heck, MD;  Location: [facility];  Service: Vascular;  Laterality: Left;  . TUBAL LIGATION    . VAGINAL DELIVERY     x2, no complications    Family History  Problem Relation Age of Onset  . CAD Mother 43       MI, aortic valve issues  . COPD Mother   . Lupus Mother   . Graves' disease Mother   . Rheum arthritis Mother   . CAD Father 31       CABG x2, aortic valve replacement  . Stroke Sister   . CAD Sister   . Anuerysm Sister        brain  . Lupus Sister   . Diabetes Sister   . Alcohol abuse Brother   . CAD Brother 90       MI  . Stroke Brother   . Seizures Son   . COPD Brother        agent orange  . CAD  Brother 23       stent  . Diabetes Brother   . Depression Grandchild   . Breast cancer Maternal Aunt   . Diabetes Sister   . Breast cancer Sister   . Breast cancer Maternal Aunt   . Stroke Maternal Grandmother   . Hypertension Maternal Grandmother   . Gallbladder disease Maternal Grandmother   . Colon cancer Neg Hx   . Esophageal cancer Neg Hx   . Rectal cancer Neg Hx   . Stomach cancer Neg Hx     SOCIAL HISTORY: Social History   Tobacco Use  . Smoking status: Never Smoker  . Smokeless tobacco: Never Used  Substance Use Topics  . Alcohol use: No    Allergies  Allergen Reactions  . Iodinated Diagnostic Agents Other (See Comments)    Itching (severe) and chest tightness  . Penicillins Anaphylaxis, Swelling, Rash and Other (See Comments)    Has patient had a PCN reaction causing immediate rash, facial/tongue/throat swelling, SOB or lightheadedness with hypotension: Yes Has patient had a PCN reaction causing severe rash involving mucus membranes or skin necrosis: Unknown Has patient had a PCN reaction that required hospitalization: Unknown Has patient had a PCN reaction occurring within the last 10 years: No If all of the above answers are "NO", then may proceed with Cephalosporin use.   . Amlodipine Swelling    Pedal edema  . Anectine [Succinylcholine] Other (See Comments)    Brother went into cardiac arrest.  . Codeine Nausea Only  . Gabapentin Other (See Comments)    Gait abnormality  . Influenza Vaccines Other (See Comments)    Muscle weakness; unable to walk  . Nortriptyline Other (See Comments)    Eye swelling and mouth drawed up  . Pamelor [Nortriptyline Hcl] Other (See Comments)    Patient states caused her face to draw in together.  . Valsartan Other (See Comments) and Cough    Allergy to generic only, "Hacking" cough  . Erythromycin Rash and Swelling  . Sulfa Antibiotics Rash    Current Outpatient Medications  Medication Sig Dispense Refill  .  acetaminophen (TYLENOL) 500 MG tablet Take 1 tablet (500 mg total) by mouth 3 (three) times daily as needed.    . Ascorbic Acid (VITAMIN C) 100 MG tablet Take 100 mg by mouth daily.    Marland Kitchen aspirin EC 81 MG tablet Take  81 mg by mouth daily.    . BD ULTRA-FINE LANCETS lancets Use as instructed upto 6 times daily 600 each 2  . clopidogrel (PLAVIX) 75 MG tablet Take 1 tablet (75 mg total) by mouth daily. 30 tablet 6  . Continuous Blood Gluc Receiver (DEXCOM G6 RECEIVER) DEVI 1 kit by Does not apply route See admin instructions. Use to check blood sugar 4 times a day 1 each 0  . Continuous Blood Gluc Sensor (DEXCOM G6 SENSOR) MISC 1 each by Does not apply route See admin instructions. Change sensor every 10 days 3 each 3  . Continuous Blood Gluc Transmit (DEXCOM G6 TRANSMITTER) MISC 1 kit by Does not apply route See admin instructions. Use to check blood sugar 1 each 1  . diclofenac Sodium (VOLTAREN) 1 % GEL Apply 2 g topically 3 (three) times daily. 100 g 3  . furosemide (LASIX) 20 MG tablet Take 1 tablet (20 mg total) by mouth daily as needed for edema. (Patient not taking: Reported on 03/25/2020) 15 tablet 0  . glucagon 1 MG injection Inject 1 mg into the muscle once as needed for up to 1 dose. 1 each 12  . glucose blood (CONTOUR NEXT TEST) test strip Use as instructed to check 4 times daily 400 each 5  . insulin aspart (NOVOLOG) 100 UNIT/ML injection Use up to 50 units a day in the insulin pump 50 mL 3  . Insulin Human (INSULIN PUMP) SOLN Inject into the skin continuous. novolog    . Insulin Pen Needle 32G X 4 MM MISC Use 4-5x a day 300 each 3  . Krill Oil 500 MG CAPS Take 2 capsules (1,000 mg total) by mouth daily. (Patient taking differently: Take 500 mg by mouth daily. )    . losartan (COZAAR) 25 MG tablet Take 1 tablet (25 mg total) by mouth daily. 30 tablet 11  . memantine (NAMENDA) 5 MG tablet Take 1 tablet (5 mg total) by mouth 2 (two) times daily. 60 tablet 3  . metoprolol succinate (TOPROL-XL)  50 MG 24 hr tablet Take 50 mg by mouth at bedtime. Take with or immediately following a meal.    . nitroGLYCERIN (NITROSTAT) 0.4 MG SL tablet Place 1 tablet (0.4 mg total) under the tongue every 5 (five) minutes as needed for chest pain. 30 tablet 1  . pregabalin (LYRICA) 25 MG capsule Take 1 capsule (25 mg total) by mouth 2 (two) times daily. Take once daily for the first week 60 capsule 1  . rosuvastatin (CRESTOR) 10 MG tablet TAKE 1 TABLET(10 MG) BY MOUTH DAILY 30 tablet 5  . SYNTHROID 75 MCG tablet TAKE 1 TABLET BY MOUTH ONCE DAILY FOR 6 DAYS AND 1 AND 1/2 TABLETS ON THE 7TH DAY (Patient taking differently: Take 75 mcg by mouth See admin instructions. Take 1 tablet daily except do not take on Sunday) 105 tablet 3   No current facility-administered medications for this visit.    REVIEW OF SYSTEMS:  _0  denotes positive finding, _1  denotes negative finding Cardiac  Comments:  Chest pain or chest pressure:    Shortness of breath upon exertion:    Short of breath when lying flat:    Irregular heart rhythm:        Vascular    Pain in calf, thigh, or hip brought on by ambulation:    Pain in feet at night that wakes you up from your sleep:     Blood clot in your veins:  Leg swelling:  x Right > left      Pulmonary    Oxygen at home:    Productive cough:     Wheezing:         Neurologic    Sudden weakness in arms or legs:     Sudden numbness in arms or legs:     Sudden onset of difficulty speaking or slurred speech:    Temporary loss of vision in one eye:     Problems with dizziness:         Gastrointestinal    Blood in stool:     Vomited blood:         Genitourinary    Burning when urinating:     Blood in urine:        Psychiatric    Major depression:         Hematologic    Bleeding problems:    Problems with blood clotting too easily:        Skin    Rashes or ulcers:        Constitutional    Fever or chills:      PHYSICAL EXAM: Vitals:   04/07/20 1048  BP:  (!) 152/78  Pulse: 78  Resp: 20  Temp: 98.1 F (36.7 C)  TempSrc: Temporal  SpO2: 98%  Weight: 163 lb 1.6 oz (74 kg)  Height: _0  (1.6 m)    GENERAL: The patient is a well-nourished female, in no acute distress. The vital signs are documented above. CARDIAC: There is a regular rate and rhythm.  VASCULAR:  Palpable DP pulses bilaterally. Lower extremity spider veins bilateral and some small varicosities - no skin thickening or ulcerations PULMONARY: There is good air exchange bilaterally without wheezing or rales. NEUROLOGIC: No focal weakness or paresthesias are detected.  CN II-XII grossly intact.  DATA:   No new imaging today.  Assessment/Plan:  79 year old female that presents for evaluation of bilateral lower extremity swelling.  I suspect she likely has lower extremity venous insufficiency given her exam.  I will order a lower extremity venous reflux study.  We will resize her for bilateral knee-high compression stockings today 20 to 30 mmHg since she cannot tolerate the thigh high stockings.  I will follow-up with her on the phone.  I will plan to see her in December for her 1 year follow-up for surveillance of her left carotid stent after left TCAR on 07/23/2018 for asymptomatic high-grade stenosis.    Marty Heck, MD [facility] Office: [place]

## 2020-04-12 ENCOUNTER — Encounter: Payer: Self-pay | Admitting: Family Medicine

## 2020-04-15 ENCOUNTER — Ambulatory Visit (HOSPITAL_COMMUNITY)
Admission: RE | Admit: 2020-04-15 | Discharge: 2020-04-15 | Disposition: A | Discharge status: Home / Self Care | Payer: PPO | Source: Ambulatory Visit | Attending: Vascular Surgery | Admitting: Vascular Surgery

## 2020-04-15 ENCOUNTER — Other Ambulatory Visit: Payer: Self-pay

## 2020-04-15 DIAGNOSIS — I872 Venous insufficiency (chronic) (peripheral): Secondary | ICD-10-CM

## 2020-04-21 ENCOUNTER — Ambulatory Visit (INDEPENDENT_AMBULATORY_CARE_PROVIDER_SITE_OTHER): Payer: PPO | Admitting: Family Medicine

## 2020-04-21 ENCOUNTER — Other Ambulatory Visit: Payer: Self-pay

## 2020-04-21 ENCOUNTER — Encounter: Payer: Self-pay | Admitting: Family Medicine

## 2020-04-21 DIAGNOSIS — I739 Peripheral vascular disease, unspecified: Secondary | ICD-10-CM | POA: Diagnosis not present

## 2020-04-21 DIAGNOSIS — I10 Essential (primary) hypertension: Secondary | ICD-10-CM | POA: Diagnosis not present

## 2020-04-21 DIAGNOSIS — E139 Other specified diabetes mellitus without complications: Secondary | ICD-10-CM

## 2020-04-21 DIAGNOSIS — M3500 Sicca syndrome, unspecified: Secondary | ICD-10-CM | POA: Diagnosis not present

## 2020-04-21 DIAGNOSIS — M797 Fibromyalgia: Secondary | ICD-10-CM

## 2020-04-21 DIAGNOSIS — I872 Venous insufficiency (chronic) (peripheral): Secondary | ICD-10-CM

## 2020-04-21 NOTE — Progress Notes (Signed)
This visit was conducted in person.  BP (!) 118/62 (BP Location: Left Arm, Patient Position: Sitting, Cuff Size: Normal)    Pulse 76    Temp (!) 97.3 F (36.3 C) (Temporal)    Ht '5\' 3"'  (1.6 m)    Wt 162 lb 11.2 oz (73.8 kg)    SpO2 97%    BMI 28.82 kg/m    CC: f/u visit  Subjective:    Patient ID: Cassandra Benson, female    DOB: [birth year], 79 y.o.   MRN: [id number]  HPI: Cassandra Benson is a 79 y.o. female presenting on 04/21/2020 for Follow-up (Pt states that she has been using the compression stockings - seems to help a lot. Leg swelling and pain reduced a lot - still having pain. )   See prior note for details.  Saw Dr Carlis [facility] - rec compression stockings. Significant benefit - improvement in pain and swelling noted to lower extremities. Venous US showed superficial venous reflux bilaterally. She also uses PRN voltaren gel (once daily).   Currently in sjogren's flare started 1.5 wks ago. This tends to get better on its own.   DM - sugars continue uncontrolled - 200-300s. Having difficulty with insulin pump. No recent dietary changes noted.  Lab Results  Component Value Date   HGBA1C 8.4 (H) 01/15/2020     Rheum - planning to establish with Dr Posey Pronto at [facility].  Lyrica started last month, currently on 16m bid. Notes weight gain. May try once daily dosing.      Relevant past medical, surgical, family and social history reviewed and updated as indicated. Interim medical history since our last visit reviewed. Allergies and medications reviewed and updated. Outpatient Medications Prior to Visit  Medication Sig Dispense Refill   acetaminophen (TYLENOL) 500 MG tablet Take 1 tablet (500 mg total) by mouth 3 (three) times daily as needed.     Ascorbic Acid (VITAMIN C) 100 MG tablet Take 100 mg by mouth daily.     aspirin EC 81 MG tablet Take 81 mg by mouth daily.     BD ULTRA-FINE LANCETS lancets Use as instructed upto 6 times daily 600 each 2   clopidogrel (PLAVIX) 75 MG tablet  Take 1 tablet (75 mg total) by mouth daily. 30 tablet 6   Continuous Blood Gluc Receiver (DEXCOM G6 RECEIVER) DEVI 1 kit by Does not apply route See admin instructions. Use to check blood sugar 4 times a day 1 each 0   Continuous Blood Gluc Sensor (DEXCOM G6 SENSOR) MISC 1 each by Does not apply route See admin instructions. Change sensor every 10 days 3 each 3   Continuous Blood Gluc Transmit (DEXCOM G6 TRANSMITTER) MISC 1 kit by Does not apply route See admin instructions. Use to check blood sugar 1 each 1   diclofenac Sodium (VOLTAREN) 1 % GEL Apply 2 g topically 3 (three) times daily. 100 g 3   furosemide (LASIX) 20 MG tablet Take 1 tablet (20 mg total) by mouth daily as needed for edema. 15 tablet 0   glucagon 1 MG injection Inject 1 mg into the muscle once as needed for up to 1 dose. 1 each 12   glucose blood (CONTOUR NEXT TEST) test strip Use as instructed to check 4 times daily 400 each 5   insulin aspart (NOVOLOG) 100 UNIT/ML injection Use up to 50 units a day in the insulin pump 50 mL 3   Insulin Human (INSULIN PUMP) SOLN Inject into the skin continuous.  novolog     Insulin Pen Needle 32G X 4 MM MISC Use 4-5x a day 300 each 3   Krill Oil 500 MG CAPS Take 2 capsules (1,000 mg total) by mouth daily. (Patient taking differently: Take 500 mg by mouth daily. )     losartan (COZAAR) 25 MG tablet Take 1 tablet (25 mg total) by mouth daily. 30 tablet 11   memantine (NAMENDA) 5 MG tablet Take 1 tablet (5 mg total) by mouth 2 (two) times daily. 60 tablet 3   metoprolol succinate (TOPROL-XL) 50 MG 24 hr tablet Take 50 mg by mouth at bedtime. Take with or immediately following a meal.     nitroGLYCERIN (NITROSTAT) 0.4 MG SL tablet Place 1 tablet (0.4 mg total) under the tongue every 5 (five) minutes as needed for chest pain. 30 tablet 1   pregabalin (LYRICA) 25 MG capsule Take 1 capsule (25 mg total) by mouth 2 (two) times daily. Take once daily for the first week 60 capsule 1    rosuvastatin (CRESTOR) 10 MG tablet TAKE 1 TABLET(10 MG) BY MOUTH DAILY 30 tablet 5   SYNTHROID 75 MCG tablet TAKE 1 TABLET BY MOUTH ONCE DAILY FOR 6 DAYS AND 1 AND 1/2 TABLETS ON THE 7TH DAY (Patient taking differently: Take 75 mcg by mouth See admin instructions. Take 1 tablet daily except do not take on Sunday) 105 tablet 3   No facility-administered medications prior to visit.     Per HPI unless specifically indicated in ROS section below Review of Systems Objective:  BP (!) 118/62 (BP Location: Left Arm, Patient Position: Sitting, Cuff Size: Normal)    Pulse 76    Temp (!) 97.3 F (36.3 C) (Temporal)    Ht '5\' 3"'  (1.6 m)    Wt 162 lb 11.2 oz (73.8 kg)    SpO2 97%    BMI 28.82 kg/m   Wt Readings from Last 3 Encounters:  04/21/20 162 lb 11.2 oz (73.8 kg)  04/07/20 163 lb 1.6 oz (74 kg)  03/25/20 158 lb (71.7 kg)      Physical Exam Vitals and nursing note reviewed.  Constitutional:      Appearance: Normal appearance. She is not ill-appearing.  Cardiovascular:     Rate and Rhythm: Normal rate and regular rhythm.     Pulses: Normal pulses.     Heart sounds: Normal heart sounds. No murmur heard.   Pulmonary:     Effort: Pulmonary effort is normal. No respiratory distress.     Breath sounds: Normal breath sounds. No wheezing, rhonchi or rales.  Musculoskeletal:     Right lower leg: No edema.     Left lower leg: No edema.     Comments:  2+ DP bilaterally Superficial varicose veins present   Skin:    General: Skin is warm and dry.     Findings: No rash.  Neurological:     Mental Status: She is alert.  Psychiatric:        Mood and Affect: Mood normal.        Behavior: Behavior normal.       Results for orders placed or performed in visit on 03/23/20  SARS Coronavirus 2 (TAT 6-24 hrs)  Result Value Ref Range   SARS Coronavirus 2 RESULT: NEGATIVE    Assessment & Plan:  This visit occurred during the SARS-CoV-2 public health emergency.  Safety protocols were in place,  including screening questions prior to the visit, additional usage of staff PPE, and extensive  cleaning of exam room while observing appropriate contact time as indicated for disinfecting solutions.   Problem List Items Addressed This Visit    Sjogren's syndrome without extraglandular involvement [facility])    Planning to establish with Dr Posey Pronto at [facility] for Sjogrens, sicca syndrome, raynauds.       PAD (peripheral artery disease) (HCC)    Strong distal pulses noted today.  Tolerating compression stockings well.       LADA (latent autoimmune diabetes in adults), managed as type 1 ([place])    Recent deterioration noted. Encouraged endo f/u.       Fibromyalgia    Notes weight gain since starting lyrica - will try once daily dosing.      Essential hypertension    Chronic, stable on current regimen of losartan and toprol XL with lasix PRN.       Chronic venous insufficiency    Leg pain and swelling has significantly improved since starting compression stockings - will continue. Appreciate [facility] care.           No orders of the defined types were placed in this encounter.  No orders of the defined types were placed in this encounter.   Patient Instructions  You are doing well today I'm glad legs are doing better.  Continue current medicines, try lower lyrica dose just once daily.  Touch base with Dr Cruzita Lederer about worsening sugar control.  Return in 3 months for follow up visit.   Follow up plan: Return in about 3 months (around 07/22/2020), or if symptoms worsen or fail to improve, for follow up visit.  [name], MD

## 2020-04-21 NOTE — Patient Instructions (Addendum)
You are doing well today I'm glad legs are doing better.  Continue current medicines, try lower lyrica dose just once daily.  Touch base with Dr Cruzita Lederer about worsening sugar control.  Return in 3 months for follow up visit.

## 2020-04-25 NOTE — Assessment & Plan Note (Signed)
Leg pain and swelling has significantly improved since starting compression stockings - will continue. Appreciate [facility] care.

## 2020-04-25 NOTE — Assessment & Plan Note (Addendum)
Recent deterioration noted. Encouraged endo f/u.

## 2020-04-25 NOTE — Assessment & Plan Note (Signed)
Notes weight gain since starting lyrica - will try once daily dosing.

## 2020-04-25 NOTE — Assessment & Plan Note (Signed)
Strong distal pulses noted today.  Tolerating compression stockings well.

## 2020-04-25 NOTE — Assessment & Plan Note (Signed)
Chronic, stable on current regimen of losartan and toprol XL with lasix PRN.

## 2020-04-25 NOTE — Assessment & Plan Note (Addendum)
Planning to establish with Dr Posey Pronto at [facility] for Sjogrens, sicca syndrome, raynauds.

## 2020-04-27 ENCOUNTER — Other Ambulatory Visit: Payer: Self-pay | Admitting: Family Medicine

## 2020-04-27 NOTE — Telephone Encounter (Signed)
Electronic refill request. Pregabalin Last office visit:   04/21/2020 Last Filled:     60 capsule 1 03/16/2020  Please advise.

## 2020-04-27 NOTE — Telephone Encounter (Signed)
ERx 

## 2020-04-29 DIAGNOSIS — E109 Type 1 diabetes mellitus without complications: Secondary | ICD-10-CM | POA: Diagnosis not present

## 2020-05-11 ENCOUNTER — Telehealth: Payer: Self-pay | Admitting: Vascular Surgery

## 2020-05-11 NOTE — Telephone Encounter (Signed)
79 year old female that I previously performed TCAR on that was recently seen in the office for worsening leg swelling.  I reviewed her venous reflux study and she has pathologic reflux in bilateral superficial great saphenous veins.  Discussed with her options of following up in 3 months to see how she is doing with a vein visit with either Dr. Scot Dock or Dr. Oneida Alar to evaluate for laser ablation versus continued medical management.  She will continue wearing her compression socks for now but is still having a fair amount of leg swelling.  I will arrrange a follow-up for 3 months to see how she is doing with a vein visit.  I will still see her for one year follow-up of carotid stent.  Marty Heck, MD [facility] Office: [place]

## 2020-05-12 ENCOUNTER — Encounter: Payer: Self-pay | Admitting: Family Medicine

## 2020-05-20 ENCOUNTER — Ambulatory Visit (INDEPENDENT_AMBULATORY_CARE_PROVIDER_SITE_OTHER): Payer: PPO | Admitting: Family Medicine

## 2020-05-20 ENCOUNTER — Encounter: Payer: Self-pay | Admitting: Family Medicine

## 2020-05-20 ENCOUNTER — Other Ambulatory Visit: Payer: Self-pay

## 2020-05-20 VITALS — BP 140/62 | HR 81 | Temp 97.9°F | Ht 63.0 in | Wt 164.2 lb

## 2020-05-20 DIAGNOSIS — M797 Fibromyalgia: Secondary | ICD-10-CM

## 2020-05-20 DIAGNOSIS — R0789 Other chest pain: Secondary | ICD-10-CM

## 2020-05-20 DIAGNOSIS — R059 Cough, unspecified: Secondary | ICD-10-CM

## 2020-05-20 DIAGNOSIS — R05 Cough: Secondary | ICD-10-CM | POA: Diagnosis not present

## 2020-05-20 DIAGNOSIS — I10 Essential (primary) hypertension: Secondary | ICD-10-CM

## 2020-05-20 DIAGNOSIS — E139 Other specified diabetes mellitus without complications: Secondary | ICD-10-CM | POA: Diagnosis not present

## 2020-05-20 MED ORDER — BENZONATATE 100 MG PO CAPS
100.0000 mg | ORAL_CAPSULE | Freq: Three times a day (TID) | ORAL | 3 refills | Status: DC | PRN
Start: 2020-05-20 — End: 2021-12-24

## 2020-05-20 MED ORDER — TRAMADOL HCL 50 MG PO TABS: 25.0000 mg | ORAL_TABLET | Freq: Two times a day (BID) | ORAL | 0 refills | Status: AC | PRN

## 2020-05-20 MED ORDER — PREGABALIN 25 MG PO CAPS: 25.0000 mg | ORAL_CAPSULE | Freq: Two times a day (BID) | ORAL | 6 refills | Status: DC

## 2020-05-20 NOTE — Progress Notes (Signed)
This visit was conducted in person.  BP 140/62 (BP Location: Left Arm, Patient Position: Sitting, Cuff Size: Normal)   Pulse 81   Temp 97.9 F (36.6 C) (Temporal)   Ht '5\' 3"'  (1.6 m)   Wt 164 lb 4 oz (74.5 kg)   SpO2 97%   BMI 29.10 kg/m    CC: wheezing  Subjective:    Patient ID: Cassandra Benson, female    DOB: [birth year], 79 y.o.   MRN: [id number]  HPI: MATISHA TERMINE is a 79 y.o. female presenting on 05/20/2020 for Wheezing (C/o cough, wheezing and feeling exhausted.  Sxs started about 1 wk ago.) and Shoulder Pain (C/o posterior left shoulder pain.  Pain described as stabbing.  Started about 1 wk ago. )   3 wk h/o increased fatigue associated with dyspnea, chest and shoulder blade discomfort and dry cough. Newly noted wheezing. Mild leg swelling noted recently but today legs look good. Notes easier exertional dyspnea recently. Seeing [facility] for known chronic venous insufficiency - regular compression stocking use has helped.   1 wk ago had episode of L posterior sharp shoulder pain without known inciting trauma/injury. Pain with getting clothes out of dryer or running vaccuum cleaner. Has been taking tylenol PRN.      Relevant past medical, surgical, family and social history reviewed and updated as indicated. Interim medical history since our last visit reviewed. Allergies and medications reviewed and updated. Outpatient Medications Prior to Visit  Medication Sig Dispense Refill  . acetaminophen (TYLENOL) 500 MG tablet Take 1 tablet (500 mg total) by mouth 3 (three) times daily as needed.    . Ascorbic Acid (VITAMIN C) 100 MG tablet Take 100 mg by mouth daily.    Marland Kitchen aspirin EC 81 MG tablet Take 81 mg by mouth daily.    . BD ULTRA-FINE LANCETS lancets Use as instructed upto 6 times daily 600 each 2  . clopidogrel (PLAVIX) 75 MG tablet Take 1 tablet (75 mg total) by mouth daily. 30 tablet 6  . Continuous Blood Gluc Receiver (DEXCOM G6 RECEIVER) DEVI 1 kit by Does not apply route  See admin instructions. Use to check blood sugar 4 times a day 1 each 0  . Continuous Blood Gluc Sensor (DEXCOM G6 SENSOR) MISC 1 each by Does not apply route See admin instructions. Change sensor every 10 days 3 each 3  . Continuous Blood Gluc Transmit (DEXCOM G6 TRANSMITTER) MISC 1 kit by Does not apply route See admin instructions. Use to check blood sugar 1 each 1  . diclofenac Sodium (VOLTAREN) 1 % GEL Apply 2 g topically 3 (three) times daily. 100 g 3  . glucagon 1 MG injection Inject 1 mg into the muscle once as needed for up to 1 dose. 1 each 12  . glucose blood (CONTOUR NEXT TEST) test strip Use as instructed to check 4 times daily 400 each 5  . insulin aspart (NOVOLOG) 100 UNIT/ML injection Use up to 50 units a day in the insulin pump 50 mL 3  . Insulin Human (INSULIN PUMP) SOLN Inject into the skin continuous. novolog    . Insulin Pen Needle 32G X 4 MM MISC Use 4-5x a day 300 each 3  . Krill Oil 500 MG CAPS Take 2 capsules (1,000 mg total) by mouth daily. (Patient taking differently: Take 500 mg by mouth daily. )    . losartan (COZAAR) 25 MG tablet Take 1 tablet (25 mg total) by mouth daily. 30 tablet 11  .  memantine (NAMENDA) 5 MG tablet Take 1 tablet (5 mg total) by mouth 2 (two) times daily. 60 tablet 3  . metoprolol succinate (TOPROL-XL) 50 MG 24 hr tablet Take 50 mg by mouth at bedtime. Take with or immediately following a meal.    . nitroGLYCERIN (NITROSTAT) 0.4 MG SL tablet Place 1 tablet (0.4 mg total) under the tongue every 5 (five) minutes as needed for chest pain. 30 tablet 1  . rosuvastatin (CRESTOR) 10 MG tablet TAKE 1 TABLET(10 MG) BY MOUTH DAILY 30 tablet 5  . SYNTHROID 75 MCG tablet Take 1 tablet (75 mcg total) by mouth daily before breakfast. Don't take any on Sundays    . pregabalin (LYRICA) 25 MG capsule Take 1 capsule (25 mg total) by mouth daily. 30 capsule 3  . SYNTHROID 75 MCG tablet TAKE 1 TABLET BY MOUTH ONCE DAILY FOR 6 DAYS AND 1 AND 1/2 TABLETS ON THE 7TH DAY  (Patient taking differently: Take 75 mcg by mouth See admin instructions. Take 1 tablet daily except do not take on Sunday) 105 tablet 3  . furosemide (LASIX) 20 MG tablet Take 1 tablet (20 mg total) by mouth daily as needed for edema. (Patient not taking: Reported on 05/20/2020) 15 tablet 0   No facility-administered medications prior to visit.     Per HPI unless specifically indicated in ROS section below Review of Systems Objective:  BP 140/62 (BP Location: Left Arm, Patient Position: Sitting, Cuff Size: Normal)   Pulse 81   Temp 97.9 F (36.6 C) (Temporal)   Ht '5\' 3"'  (1.6 m)   Wt 164 lb 4 oz (74.5 kg)   SpO2 97%   BMI 29.10 kg/m   Wt Readings from Last 3 Encounters:  05/20/20 164 lb 4 oz (74.5 kg)  04/21/20 162 lb 11.2 oz (73.8 kg)  04/07/20 163 lb 1.6 oz (74 kg)      Physical Exam Vitals and nursing note reviewed.  Constitutional:      Appearance: Normal appearance. She is not ill-appearing.  Cardiovascular:     Rate and Rhythm: Normal rate and regular rhythm.     Pulses: Normal pulses.     Heart sounds: Normal heart sounds. No murmur heard.   Pulmonary:     Effort: Pulmonary effort is normal. No respiratory distress.     Breath sounds: Normal breath sounds. No wheezing, rhonchi or rales.     Comments: Lungs clear Musculoskeletal:        General: Tenderness present. No swelling. Normal range of motion.     Cervical back: Normal range of motion and neck supple.     Right lower leg: No edema.     Left lower leg: No edema.     Comments:  Diffuse discomfort to palpation at cervical and thoracic spine midline as well as traps and rhomboids  Discomfort to palpation posterior left shoulder  Skin:    General: Skin is warm and dry.     Findings: No rash.  Neurological:     Mental Status: She is alert.  Psychiatric:        Mood and Affect: Mood normal.        Behavior: Behavior normal.       Results for orders placed or performed in visit on 03/23/20  SARS  Coronavirus 2 (TAT 6-24 hrs)  Result Value Ref Range   SARS Coronavirus 2 RESULT: NEGATIVE    Assessment & Plan:  This visit occurred during the SARS-CoV-2 public health emergency.  Safety  protocols were in place, including screening questions prior to the visit, additional usage of staff PPE, and extensive cleaning of exam room while observing appropriate contact time as indicated for disinfecting solutions.   Problem List Items Addressed This Visit    LADA (latent autoimmune diabetes in adults), managed as type 1 ([place])    Uncontrolled recently - has upcoming endo f/u planned.       Fibromyalgia    Anticipate component of FM flare - will increase lyrica to 98m bid as well as provider tramadol PRN breakthrough pain       Relevant Medications   pregabalin (LYRICA) 25 MG capsule   traMADol (ULTRAM) 50 MG tablet   Essential hypertension    Borderline reading today, adequate for patient.       Cough - Primary    Reassuring lung exam without wheezing or abnormality today. No need for CXR. Will provide with PRN tessalon perls PRN cough. Swallow, don't chew.       Chest pain    Recent chest pain does not sound cardiac related but rather MSK. See above.           Meds ordered this encounter  Medications  . benzonatate (TESSALON) 100 MG capsule    Sig: Take 1 capsule (100 mg total) by mouth 3 (three) times daily as needed for cough.    Dispense:  30 capsule    Refill:  3  . pregabalin (LYRICA) 25 MG capsule    Sig: Take 1 capsule (25 mg total) by mouth 2 (two) times daily.    Dispense:  60 capsule    Refill:  6    Note new sig  . traMADol (ULTRAM) 50 MG tablet    Sig: Take 0.5-1 tablets (25-50 mg total) by mouth 2 (two) times daily as needed for up to 5 days for moderate pain.    Dispense:  15 tablet    Refill:  0   No orders of the defined types were placed in this encounter.   Patient Instructions  Lungs sounding ok today  Increase lyrica to 24mtwice daily.    Tessalon perls for cough as needed Tramadol 1/2 tablet as needed for flares for breakthrough pain.    Follow up plan: Return if symptoms worsen or fail to improve.  JaRia BushMD

## 2020-05-20 NOTE — Patient Instructions (Signed)
Lungs sounding ok today  Increase lyrica to 25mg  twice daily.  Tessalon perls for cough as needed Tramadol 1/2 tablet as needed for flares for breakthrough pain.

## 2020-05-21 NOTE — Assessment & Plan Note (Signed)
Anticipate component of FM flare - will increase lyrica to 25mg  bid as well as provider tramadol PRN breakthrough pain

## 2020-05-21 NOTE — Assessment & Plan Note (Signed)
Uncontrolled recently - has upcoming endo f/u planned.

## 2020-05-21 NOTE — Assessment & Plan Note (Signed)
Borderline reading today, adequate for patient.

## 2020-05-21 NOTE — Assessment & Plan Note (Signed)
Recent chest pain does not sound cardiac related but rather MSK. See above.

## 2020-05-21 NOTE — Assessment & Plan Note (Signed)
Reassuring lung exam without wheezing or abnormality today. No need for CXR. Will provide with PRN tessalon perls PRN cough. Swallow, don't chew.

## 2020-05-25 ENCOUNTER — Encounter: Payer: Self-pay | Admitting: Internal Medicine

## 2020-05-25 ENCOUNTER — Other Ambulatory Visit: Payer: Self-pay

## 2020-05-25 ENCOUNTER — Ambulatory Visit: Payer: PPO | Admitting: Internal Medicine

## 2020-05-25 VITALS — BP 150/60 | HR 110 | Ht 63.0 in | Wt 163.0 lb

## 2020-05-25 DIAGNOSIS — E038 Other specified hypothyroidism: Secondary | ICD-10-CM

## 2020-05-25 DIAGNOSIS — E139 Other specified diabetes mellitus without complications: Secondary | ICD-10-CM

## 2020-05-25 DIAGNOSIS — E782 Mixed hyperlipidemia: Secondary | ICD-10-CM

## 2020-05-25 DIAGNOSIS — E063 Autoimmune thyroiditis: Secondary | ICD-10-CM

## 2020-05-25 LAB — POCT GLYCOSYLATED HEMOGLOBIN (HGB A1C): Hemoglobin A1C: 9.3 % — AB (ref 4.0–5.6)

## 2020-05-25 NOTE — Progress Notes (Signed)
Patient ID: Cassandra Benson, female   DOB: 01/13/1941, 79 y.o.   MRN: [id number]   This visit occurred during the SARS-CoV-2 public health emergency.  Safety protocols were in place, including screening questions prior to the visit, additional usage of staff PPE, and extensive cleaning of exam room while observing appropriate contact time as indicated for disinfecting solutions.   HPI: Cassandra Benson is a 79 y.o.-year-old female, returning for f/u for LADA, initially dx'ed in 1998 (79 y/o), started insulin at dx, started insulin pump in ~2008, uncontrolled, without complications and hypothyroidism. She previously saw endocrinology at [place] (Dr. Janese Banks) and Dr Howell Rucks. Last visit with me 4 months ago.  More recently, her sugars increased to 200s.   At this visit, she again complains about significant amount of pain all over her body due to her rheumatologic autoimmune diseases.  Her Lyrica was increased to 25 mg twice a day for back pain.  She noted weight gain on this.  She also started on compression hoses recommended by [facility].  These are helping.  Reviewed HbA1c levels: Lab Results  Component Value Date   HGBA1C 8.4 (H) 01/15/2020   HGBA1C 9.8 (A) 10/08/2019   HGBA1C 9.7 (H) 09/10/2019   She was previously on an insulin pump: - Medtronic 723-started 09/2016 (changed 07/2017), without CGM.  She uses NovoLog in the pump.  She was using [employer] for supplies before, but she is now forced by insurance to use [employer].  She came off the pump as she had a lot of problems getting supplies from them. - basal rates: 12 am: 0.800 units/h 3 am: 0.875 9:30 am: 0.100 >> 0.500 5 pm: 1.500 6:30 pm: 1.300 11:30 pm: 1.800  - ICR:   12 am: 10 (8 if on prednisone) 6 pm: 8 (6 if on Prednisone) - target: 130-130 except 6-8 am and 6 pm-12 am: 100-100 - ISF: 35 - Insulin on Board: 4h - bolus wizard: On Total daily dose: 30 units per day (up to 40 units for prescription) - extended bolusing: Not using -  changes infusion site: 5 to 6 days - Meter: Bayer Contour   At last visit, she was on: - Tresiba 18 >> 20-22 units in am - Novolog: ICR: 1:8 (starches)-1:10 (other foods), but mostly using carb equivalents Target: 130 Insulin sensitivity factor: 20 If you correct sugars at bedtime, only correct >200 and only give 2 to 3 units. She was previously using Glucophage ER d.a.w. but had to stop because this was not covered by her insurance.  She tried the generic metformin ER and this caused diarrhea so she had to stop.  She is back on insulin pump now: T:slim x2 + prev. Dexcom G6 CGM (now off).  Pump settings: - basal rates: 12 am: 0.800 units/h 3 am: 0.875 >> 0.900 9:30 am: 0.600 >> 0.650 5 pm: 1.300 6:30 pm: 1.200 11:30 pm: 1.500 >> 1.200 - ICR: 1:8 - target: 110 - ISF:  12 am: 1:20 8 pm: 1:30 - Active insulin time: 5h >> 4h  TDD from basal insulin: 51% (18 units) >> 52% (18 units) >> 55% (18 units) TDD from bolus insulin: 49% (17 units) >> 48% (17 units) >> 45% (20% from food bolus +25% from correction) (15 units) TDD: 35 to 50 units >> 35-50 >> up to 50 units  - extended bolusing: not using - changes infusion site: q3 days - Meter: Contour  She checks her sugars 2x a day: - am: 157, 171-264 - 2h after  b'fast: n/c - lunch: 188-239 - 2h after lunch: 292 - dinner: 81, 98-180 - 2h after dinner: 128, 270 - bedtime: 255  Previously:   Lowest sugar was 43 >> ... 134 >> 61 >> 40 x 1 at night; she has hypoglycemia awareness in the 80s.  No history of hypoglycemia admissions.  She does have a glucagon kit at home. Highest sugar was 398 >> 500s (dehydration, afterwards: 339) >> 271; no history of DKA admissions.  Pt's meals are: - Breakfast: protein drink + almond milk - Lunch: PB jelly sandwich or sandwich with ham or cream cheese sandwich - Dinner: salads  - Snacks: pretzels; pork tenderloin + veggies; chicken + tenderloin No sodas.  She is walking 3 times a day for 30  minutes.  No history of CKD, last BUN/creatinine:  Lab Results  Component Value Date   BUN 10 03/05/2020   BUN 11 01/16/2020   CREATININE 0.93 03/05/2020   CREATININE 1.02 (H) 01/16/2020  On lisinopril. + HL;  last set of lipids: Lab Results  Component Value Date   CHOL 212 (H) 09/10/2019   HDL 40.40 09/10/2019   LDLCALC 141 (H) 09/10/2019   LDLDIRECT 177.0 08/09/2016   TRIG 153.0 (H) 09/10/2019   CHOLHDL 5 09/10/2019  On Crestor 10-continues to have muscle cramps despite using co-Q10.  She also takes [employer].  Praluent was not covered. - last eye exam was in 05/2019: No DR - no numbness and tingling in her feet.  She was admitted for CP + SOB 10/01/2017.  Cardiac events and PE were ruled out.  She is seeing cardiology (Dr. Rockey Situ).  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  She had right carotid ultrasound in 2020 and there is a 39% stenosis, significantly increased since 2018.  She has a history of left carotid endarterectomy.  Hypothyroidism: -Due to Hashimoto's thyroiditis -Positive family history of Graves' disease in her mother -She was initially on Levoxyl, but she thought Levoxyl increased her lipid levels and caused hair loss, so we changed to Synthroid.  She feels much better on the brand-name.  She is currently on Synthroid 75 mcg 6 out of 7 days: - in am - fasting - at least 30 min from b'fast - no Ca, Fe, PPIs, + MVIs at night - not on Biotin  Reviewed her TFTs Lab Results  Component Value Date   TSH 1.73 11/27/2019   TSH 0.12 (L) 10/07/2019   TSH 0.22 (L) 09/10/2019   TSH 1.10 11/06/2018   TSH 0.09 (L) 08/29/2018   TSH 0.92 11/27/2017   TSH 0.30 (L) 10/11/2017   TSH 0.24 (L) 08/11/2017   TSH 0.58 01/04/2017   TSH 0.42 08/09/2016   She also has history of SLE and Sjogren's syndrome.  She has generalized muscle aches and joint aches from these 2 conditions.  We checked her  hormone levels in the setting of hirsutism and acne and they were normal: Component     Latest Ref Rng & Units 09/02/2014 09/08/2014  Testosterone     3 - 41 ng/dL 59 (H)   Testosterone Free     0.0 - 4.2 pg/mL 2.1   FSH     mIU/mL  68.6  LH     mIU/mL  43.4  DHEA-SO4     7 - 177 ug/dL 49   Cortisol, Plasma     ug/dL  1.3 (Normal Dexamethasone suppression test)   Estradiol  pg/mL  24.0   She had no signs of adrenal insufficiency: Component     Latest Ref Rng & Units 01/04/2017  Cortisol - AM     mcg/dL 17.6  C206 ACTH     6 - 50 pg/mL 15   She was admitted 01/15/2020 with SBO.  She has mild cognitive impairment and was started on Namenda.  ROS: Constitutional: no weight gain/no weight loss, + fatigue, no subjective hyperthermia, no subjective hypothermia Eyes: no blurry vision, no xerophthalmia ENT: no sore throat, no nodules palpated in neck, no dysphagia, no odynophagia, no hoarseness Cardiovascular: no CP/no SOB/no palpitations/no leg swelling Respiratory: no cough/no SOB/no wheezing Gastrointestinal: no N/no V/no D/no C/no acid reflux Musculoskeletal: + muscle aches/+ joint aches Skin: no rashes, no hair loss Neurological: no tremors/no numbness/no tingling/no dizziness  I reviewed pt's medications, allergies, PMH, social hx, family hx, and changes were documented in the history of present illness. Otherwise, unchanged from my initial visit note.  Past Medical History:  Diagnosis Date  . Allergy   . ANA positive    positive ANA pattern 1 speckled  . Arthritis   . Carotid stenosis, asymptomatic 06/19/2015   5-10% RICA 25-85% LICA rpt 1 yr (10/7780)   . Colon polyps   . Dermatomyositis ([place])   . Diabetes mellitus without complication (HCC)    Type 1  . Diverticulosis    sigmoid on CT scan 12/2019  . Family history of adverse reaction to anesthesia    brothr went into cardiac arrest from anectine  . Fibromyalgia    prior PCP  . GERD (gastroesophageal reflux  disease)    prior PCP  . Glaucoma    Narrow angle  . History of blood clots    DVT, in 20s, none since  . History of chicken pox   . History of diverticulitis   . History of pericarditis 1986   with hospitalization  . History of pneumonia 2014  . History of shingles   . History of UTI   . Hyperlipidemia   . Hypertension   . Hypothyroidism   . Mixed connective tissue disease ([place])   . Partial small bowel obstruction ([place]) 12/2019   managed conservatively  . Peptic ulcer   . Pneumonia   . PONV (postoperative nausea and vomiting)   . Raynaud's disease without gangrene   . Shoulder pain left   h/o RTC tendonitis and adhesive capsulitis  . Sjogren's syndrome ([place])   . Sleep apnea    prior PCP - no CPAP for about 10 yrs  . 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[place])   . Vitamin D deficiency    prior PCP   Past Surgical History:  Procedure Laterality Date  . ABDOMINAL HYSTERECTOMY  1978   fibroids and menorrhagia, ovaries remain  . ARTERY BIOPSY Right 04/06/2018   Procedure: BIOPSY TEMPORAL ARTERY RIGHT;  Surgeon: Beverly Gust, MD;  Location: [facility];  Service: ENT;  Laterality: Right;  Diabetic - insulin pump sleep apnea  . [facility] normal per patient  . COLONOSCOPY  10/2011   1 TA, 1 HP, very tortuous colon ([place])  . COLONOSCOPY WITH ESOPHAGOGASTRODUODENOSCOPY (EGD)  03/2007   2 ulcers, benign polyp, rpt 5 yrs ([place], [place])  . JOINT REPLACEMENT Right    hip  . PARTIAL HIP ARTHROPLASTY  2013   Right hip replacement  . TONSILLECTOMY    . TONSILLECTOMY AND ADENOIDECTOMY    . TRANSCAROTID ARTERY REVASCULARIZATION Left 07/23/2018  Procedure: TRANSCAROTID ARTERY REVASCULARIZATION;  Surgeon: Marty Heck, MD;  Location: [facility];  Service: Vascular;  Laterality: Left;  . TUBAL LIGATION    . VAGINAL DELIVERY     x2, no complications   Social History   Social History  . Widowed   . Number of children: 2    Occupational History  Retired    Social History Main Topics  . Smoking status: Never Smoker  . Smokeless tobacco: Never Used  . Alcohol use No  . Drug use: No   Social History Narrative   Lives in [place] now. Recently moved from [place].   No pets.   Mother of Luise Yamamoto.   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 acetaminophen (TYLENOL) 500 MG tablet Take 1 tablet (500 mg total) by mouth 3 (three) times daily as needed.    . Ascorbic Acid (VITAMIN C) 100 MG tablet Take 100 mg by mouth daily.    Marland Kitchen aspirin EC 81 MG tablet Take 81 mg by mouth daily.    . BD ULTRA-FINE LANCETS lancets Use as instructed upto 6 times daily 600 each 2  . benzonatate (TESSALON) 100 MG capsule Take 1 capsule (100 mg total) by mouth 3 (three) times daily as needed for cough. 30 capsule 3  . clopidogrel (PLAVIX) 75 MG tablet Take 1 tablet (75 mg total) by mouth daily. 30 tablet 6  . Continuous Blood Gluc Receiver (DEXCOM G6 RECEIVER) DEVI 1 kit by Does not apply route See admin instructions. Use to check blood sugar 4 times a day 1 each 0  . Continuous Blood Gluc Sensor (DEXCOM G6 SENSOR) MISC 1 each by Does not apply route See admin instructions. Change sensor every 10 days 3 each 3  . Continuous Blood Gluc Transmit (DEXCOM G6 TRANSMITTER) MISC 1 kit by Does not apply route See admin instructions. Use to check blood sugar 1 each 1  . diclofenac Sodium (VOLTAREN) 1 % GEL Apply 2 g topically 3 (three) times daily. 100 g 3  . glucagon 1 MG injection Inject 1 mg into the muscle once as needed for up to 1 dose. 1 each 12  . glucose blood (CONTOUR NEXT TEST) test strip Use as instructed to check 4 times daily 400 each  5  . insulin aspart (NOVOLOG) 100 UNIT/ML injection Use up to 50 units a day in the insulin pump 50 mL 3  . Insulin Human (INSULIN PUMP) SOLN Inject into the skin continuous. novolog    . Insulin Pen Needle 32G X 4 MM MISC Use 4-5x a day 300 each 3  . Krill Oil 500 MG CAPS Take 2 capsules (1,000 mg total) by mouth daily. (Patient taking differently: Take 500 mg by mouth daily. )    . losartan (COZAAR) 25 MG tablet Take 1 tablet (25 mg total) by mouth daily. 30 tablet 11  . memantine (NAMENDA) 5 MG tablet Take 1 tablet (5 mg total) by mouth 2 (two) times daily. 60 tablet 3  . metoprolol succinate (TOPROL-XL) 50 MG 24 hr tablet Take 50 mg by mouth at bedtime. Take with or immediately following a meal.    . nitroGLYCERIN (NITROSTAT) 0.4 MG SL  tablet Place 1 tablet (0.4 mg total) under the tongue every 5 (five) minutes as needed for chest pain. 30 tablet 1  . pregabalin (LYRICA) 25 MG capsule Take 1 capsule (25 mg total) by mouth 2 (two) times daily. 60 capsule 6  . rosuvastatin (CRESTOR) 10 MG tablet TAKE 1 TABLET(10 MG) BY MOUTH DAILY 30 tablet 5  . SYNTHROID 75 MCG tablet Take 1 tablet (75 mcg total) by mouth daily before breakfast. Don't take any on Sundays    . traMADol (ULTRAM) 50 MG tablet Take 0.5-1 tablets (25-50 mg total) by mouth 2 (two) times daily as needed for up to 5 days for moderate pain. 15 tablet 0   No current facility-administered medications on file prior to visit.   Allergies  Allergen Reactions  . Iodinated Diagnostic Agents Other (See Comments)    Itching (severe) and chest tightness  . Penicillins Anaphylaxis, Swelling, Rash and Other (See Comments)    Has patient had a PCN reaction causing immediate rash, facial/tongue/throat swelling, SOB or lightheadedness with hypotension: Yes Has patient had a PCN reaction causing severe rash involving mucus membranes or skin necrosis: Unknown Has patient had a PCN reaction that required hospitalization: Unknown Has patient had a  PCN reaction occurring within the last 10 years: No If all of the above answers are "NO", then may proceed with Cephalosporin use.   . Amlodipine Swelling    Pedal edema  . Anectine [Succinylcholine] Other (See Comments)    Brother went into cardiac arrest.  . Codeine Nausea Only  . Gabapentin Other (See Comments)    Gait abnormality  . Influenza Vaccines Other (See Comments)    Muscle weakness; unable to walk  . Nortriptyline Other (See Comments)    Eye swelling and mouth drawed up  . Pamelor [Nortriptyline Hcl] Other (See Comments)    Patient states caused her face to draw in together.  . Valsartan Other (See Comments) and Cough    Allergy to generic only, "Hacking" cough  . Erythromycin Rash and Swelling  . Sulfa Antibiotics Rash   Family History  Problem Relation Age of Onset  . CAD Mother 63       MI, aortic valve issues  . COPD Mother   . Lupus Mother   . Graves' disease Mother   . Rheum arthritis Mother   . CAD Father 105       CABG x2, aortic valve replacement  . Stroke Sister   . CAD Sister   . Anuerysm Sister        brain  . Lupus Sister   . Diabetes Sister   . Alcohol abuse Brother   . CAD Brother 76       MI  . Stroke Brother   . Seizures Son   . COPD Brother        agent orange  . CAD Brother 27       stent  . Diabetes Brother   . Depression Grandchild   . Breast cancer Maternal Aunt   . Diabetes Sister   . Breast cancer Sister   . Breast cancer Maternal Aunt   . Stroke Maternal Grandmother   . Hypertension Maternal Grandmother   . Gallbladder disease Maternal Grandmother   . Colon cancer Neg Hx   . Esophageal cancer Neg Hx   . Rectal cancer Neg Hx   . Stomach cancer Neg Hx    PE: BP (!) 150/60   Pulse (!) 110   Ht _0  (1.6  m)   Wt 163 lb (73.9 kg)   SpO2 96%   BMI 28.87 kg/m  Wt Readings from Last 3 Encounters:  05/25/20 163 lb (73.9 kg)  05/20/20 164 lb 4 oz (74.5 kg)  04/21/20 162 lb 11.2 oz (73.8 kg)   Constitutional: normal  weight, in NAD Eyes: PERRLA, EOMI, no exophthalmos ENT: moist mucous membranes, + palpable thyroid, no cervical lymphadenopathy Cardiovascular: tachycardia, RR, No MRG Respiratory: CTA B Gastrointestinal: abdomen soft, NT, ND, BS+ Musculoskeletal: no deformities, strength intact in all 4 Skin: moist, warm, + acne on face (believes from NovoLog) Neurological: no tremor with outstretched hands, DTR normal in all 4  ASSESSMENT: 1. DM1, uncontrolled, without long-term complications, but with hyperglycemia  2.  Hashimoto's hypothyroidism  3. HL  PLAN:  1. Patient with longstanding, uncontrolled, LADA, on an insulin pump with improved control at last visit after switching back to the insulin pump from basal-bolus insulin regimen.  She was transiently on this due to problems with insurance but she is currently on a t:slim X2 insulin pump, prev. with Dexcom G6 CGM - now off. -Before last visit, HbA1c was higher, at 8.4% increase from 7.4%.  At that time, she returned after hospitalization for small bowel obstruction.  Despite this, sugars remained better than before with a pattern of slightly higher blood sugars in the morning which did not improve later in the day.  Therefore, we increased her basal rates overnight.  Also, we changed her active insulin time from 5-hour to 4 hours.  Otherwise, we did not change the rest of the settings.  She had irritation at the site of the pump attachment and I advised her to use skin tac or Flonase for prepping the skin before attaching the pump.  Also, she was off the CGM at that time and I advised her to attach it as soon as possible.  However, at this visit, her CGM is still and she is waiting for new sensor supplies. -Reviewing together meter downloads and also the pump downloads, it appears that her sugars are higher than before and quite variable.  Her sugars are highest in the morning, mostly in the 200s and they may improve during the day, with few exceptions.   No significant low blood sugars.  She does have a blood sugar 35 entered in her pump but I believe that this may have been an air entry, since she does not remember feeling differently when she entered this into the pump. -At this visit we discussed about the absolute importance of attaching her CGM as soon as possible since she was getting much better control when in control IQ closed-loop between the CGM and the pump.  She agrees to start this ASAP.  -Also, she has instances in which she does not introduce any carbs into the pump (we reviewed stretch of 4 days in which she only introduced 45g of carbs in one of the days) and otherwise been boluses based on the blood sugars or blind boluses.  We discussed that in this case, she will not use her pump's settings correctly and this is not conducive to good control -Upon questioning, patient tells me that she may not eat well during the day and I advised her that if she is not eating, she will still need to correct her blood sugars and she needs to check her sugars every 4 hours (of course if she does not have the CGM attached) and correct either the high or the low that  she observes -Also, she tells me that she is not bolusing 15 minutes before the meal and I again emphasized the importance of not only bolusing when she sits down to eat but 15 minutes before she actually starts eating.  She will start doing so. -Since her sugars during the night are quite high and she always wakes up with a high blood sugar, at this visit I increased her basal rates during the night and also in the majority of the day, until 5 PM.  I introduce these changes into the pump for her. - I advised her to:  Patient Instructions  Please use the following pump settings: - basal rates: 12 am: 0.800 units/h >> 0.950 3 am: 0.900 >> 1.000 9:30 am: 0.650 >> 0.750 5 pm: 1.300 6:30 pm: 1.200 11:30 pm: 1.200 - ICR: 1:8 - target: 110 - ISF:  12 am: 20 8 pm: 30 - Active insulin  time: 4h  Even if you do not eat, you will likely need to bolus insulin - check every 4h and correct the blood sugars when you check.  Please do the following approximately 15 minutes before every meal: - Enter carbs (C) - Enter sugars (S) - Start insulin bolus (I)  Please return in 3 months.   - we checked her HbA1c: 9.3% (higher) - advised to check sugars at different times of the day - 4x a day, rotating check times - advised for yearly eye exams >> she is UTD - return to clinic in 3 months  2.  Hashimoto's hypothyroidism - latest thyroid labs reviewed with pt >> normal: Lab Results  Component Value Date   TSH 1.73 11/27/2019   - she continues on LT4 75 mcg 6 out of 7 days-equivalent of 64 mcg daily - we discussed about taking the thyroid hormone every day, with water, >30 minutes before breakfast, separated by >4 hours from acid reflux medications, calcium, iron, multivitamins. Pt. is taking it correctly. - She also has a palpable thyroid, most likely related to her Hashimoto's thyroiditis  3. HL -Reviewed latest lipid panel from 08/2019: LDL improved but still above target, HDL at goal: Lab Results  Component Value Date   CHOL 212 (H) 09/10/2019   HDL 40.40 09/10/2019   LDLCALC 141 (H) 09/10/2019   LDLDIRECT 177.0 08/09/2016   TRIG 153.0 (H) 09/10/2019   CHOLHDL 5 09/10/2019  -On Crestor 10.  She has some muscle aches  Total time spent for the visit: 40 min, in reviewing her pump and meter downloads, discussing her hypo- and hyper-glycemic episodes, reviewing previous labs and pump settings and developing a plan to avoid hypo- and hyper-glycemia.   Philemon Kingdom, MD PhD [facility]

## 2020-05-25 NOTE — Patient Instructions (Addendum)
Please use the following pump settings: - basal rates: 12 am: 0.800 units/h >> 0.950 3 am: 0.900 >> 1.000 9:30 am: 0.650 >> 0.750 5 pm: 1.300 6:30 pm: 1.200 11:30 pm: 1.200 - ICR: 1:8 - target: 110 - ISF:  12 am: 20 8 pm: 30 - Active insulin time: 4h  Even if you do not eat, you will likely need to bolus insulin - check every 4h and correct the blood sugars when you check.  Please do the following approximately 15 minutes before every meal: - Enter carbs (C) - Enter sugars (S) - Start insulin bolus (I)  Please return in 3 months.

## 2020-05-30 DIAGNOSIS — E109 Type 1 diabetes mellitus without complications: Secondary | ICD-10-CM | POA: Diagnosis not present

## 2020-06-14 IMAGING — CT CT ANGIO HEAD
1 of 11 series · 5 of 33 positions shown · IV contrast (omnipaque)
Comparison: 01/30/2018 head CT.

CLINICAL DATA: Chronic intractable headache. Known carotid
stenosis.

EXAM:
CT ANGIOGRAPHY HEAD AND NECK
TECHNIQUE: Multidetector CT imaging of the head and neck was performed using
the standard protocol during bolus administration of intravenous
contrast. Multiplanar CT image reconstructions and MIPs were
obtained to evaluate the vascular anatomy. Carotid stenosis
measurements (when applicable) are obtained utilizing NASCET
criteria, using the distal internal carotid diameter as the
denominator.
CONTRAST:  75mL OMNIPAQUE IOHEXOL 350 MG/ML SOLN

[Series 10: ax thin · axial · 0.44mm/px · z∈[-249,-14]mm · 5 of 353 slices shown]
[im 59/353  soft-tissue]
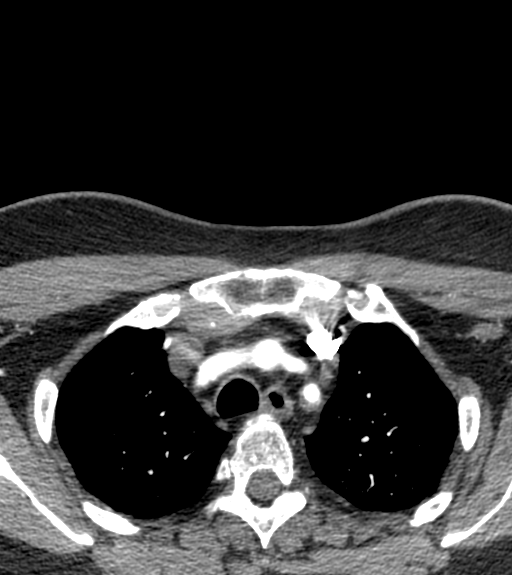
[im 118/353  bone]
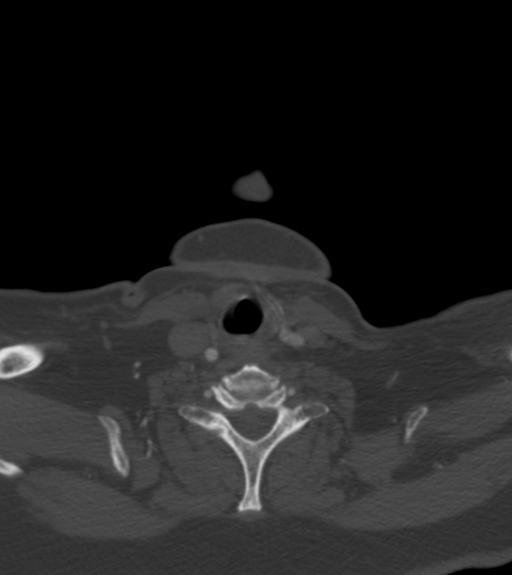
[im 177/353  soft-tissue]
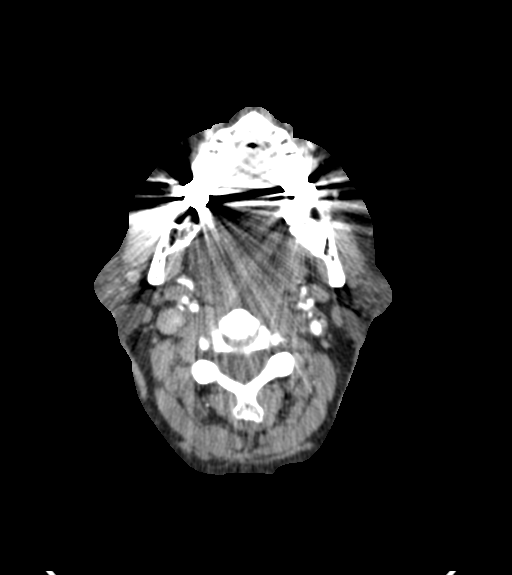
[im 235/353  bone]
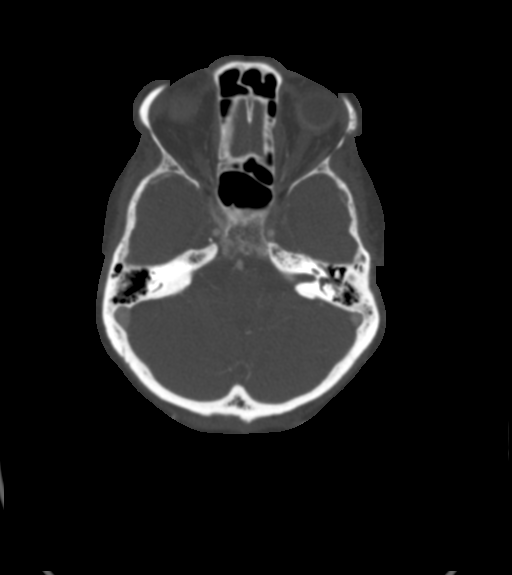
[im 294/353  soft-tissue]
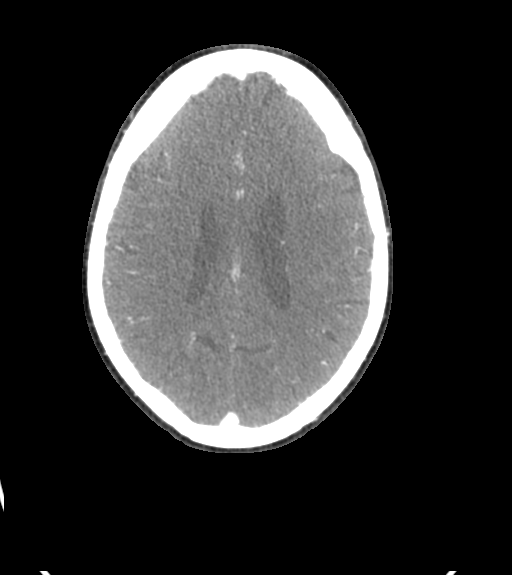

[5 of 33 positions shown; findings below may reference images not displayed]

FINDINGS: CT HEAD FINDINGS

Brain: No evidence of acute infarction, hemorrhage, hydrocephalus,
extra-axial collection or mass lesion/mass effect.

Vascular: No hyperdense vessel or unexpected calcification.

Skull: Normal. Negative for fracture or focal lesion.

Sinuses: Negative

Orbits: Negative

Review of the MIP images confirms the above findings

CTA NECK FINDINGS

Aortic arch: 2 vessel branching. Noncalcified atheromatous wall
thickening best seen along the proximal descending segment.

Right carotid system: Vessels are smooth and widely patent. No
definite atheromatous changes.

Left carotid system: Motion artifact at the level of the proximal
common carotid without suspected underlying stenosis. There is a
focal noncalcified posterior wall plaque at the proximal left ICA
causing 70% stenosis. No ulceration.

Vertebral arteries: No proximal subclavian stenosis. Mild left
subclavian atherosclerosis. Codominant vertebral arteries that are
smooth and widely patent to the dura.

Skeleton: Degenerative changes that acute or aggressive finding.

Other neck: No evidence of mass or inflammation. Bilateral cataract
resection. Atrophic appearance of the thyroid

Upper chest: Negative

Review of the MIP images confirms the above findings

CTA HEAD FINDINGS

Anterior circulation: Calcified plaque on the carotid siphons with
up to 50% stenosis at the left paraclinoid segment. Milder narrowing
at the right paraclinoid segment. Early branching left MCA. No
branch occlusion. Infundibular appearance at the left supraclinoid
ICA. Negative for aneurysm.

Posterior circulation: Codominant vertebral arteries. Vertebral and
basilar arteries are smooth and widely patent. No branch occlusion,
beading, or aneurysm

Venous sinuses: Negative

Anatomic variants: None significant

Delayed phase: No abnormal intracranial enhancement.

Review of the MIP images confirms the above findings
IMPRESSION: 1. 
70% atheromatous narrowing of the proximal left ICA.
2. Carotid siphon atherosclerosis with up to 50% narrowing at the
left paraclinoid segment.

## 2020-07-01 ENCOUNTER — Encounter: Payer: Self-pay | Admitting: Family Medicine

## 2020-07-08 ENCOUNTER — Encounter: Payer: Self-pay | Admitting: Family Medicine

## 2020-07-08 ENCOUNTER — Ambulatory Visit (INDEPENDENT_AMBULATORY_CARE_PROVIDER_SITE_OTHER): Payer: PPO | Admitting: Family Medicine

## 2020-07-08 ENCOUNTER — Other Ambulatory Visit: Payer: Self-pay

## 2020-07-08 VITALS — BP 140/72 | HR 87 | Temp 97.7°F | Ht 63.0 in | Wt 163.1 lb

## 2020-07-08 DIAGNOSIS — E1169 Type 2 diabetes mellitus with other specified complication: Secondary | ICD-10-CM

## 2020-07-08 DIAGNOSIS — G453 Amaurosis fugax: Secondary | ICD-10-CM

## 2020-07-08 DIAGNOSIS — I7090 Unspecified atherosclerosis: Secondary | ICD-10-CM

## 2020-07-08 DIAGNOSIS — M797 Fibromyalgia: Secondary | ICD-10-CM | POA: Diagnosis not present

## 2020-07-08 DIAGNOSIS — E785 Hyperlipidemia, unspecified: Secondary | ICD-10-CM | POA: Diagnosis not present

## 2020-07-08 DIAGNOSIS — R519 Headache, unspecified: Secondary | ICD-10-CM | POA: Diagnosis not present

## 2020-07-08 DIAGNOSIS — I6522 Occlusion and stenosis of left carotid artery: Secondary | ICD-10-CM

## 2020-07-08 NOTE — Progress Notes (Signed)
This visit was conducted in person.  BP 140/72 (BP Location: Left Arm, Patient Position: Sitting, Cuff Size: Normal)   Pulse 87   Temp 97.7 F (36.5 C) (Temporal)   Ht '5\' 3"'  (1.6 m)   Wt 163 lb 1 oz (74 kg)   SpO2 97%   BMI 28.89 kg/m    CC: blacking out episode Subjective:    Patient ID: Cassandra Benson, female    DOB: November 20, 1940, 79 y.o.   MRN: [id number]  HPI: Cassandra Benson is a 79 y.o. female presenting on 07/08/2020 for [employer] (C/o episode of "blacking out", then moment of confusion.  Happened about 2 wks ago. C/o constant pain in top of head radiating to the right of head.  Also, c/o burning sensation in scalp. ) and Discuss Medication (Wants to discuss Lyrica. )   Episode of full dark vision occurred 2 wks ago (06/28/2020) while getting ready for church followed by cloudy vision, acute confusional episode and imbalance for several hours. Since then having constant pain at vertex of head with radiation to R temple as well as burning sensation to vertex of scalp without concomitant vesicular rash.   Denies chest pain/tightness, dyspnea, palpitations with this episode.  No inciting trigger or increased stress.   Continues aspirin 1m daily as well as crestor 163mdaily.   Known carotid stenosis s/p L transcarotid revascularization 06/2018 (Dr ClCarlis Abbott Known Sjogren's syndrome.   Last carotid US[place]2/2020 reviewed Last echocardiogram 09/2017 reviewed   Wonders if lyrica 2565mid causing weight gain. Notes some improvement in body aches. Wonders about dropping to once daily.   Seeing cards 07/2020.      Relevant past medical, surgical, family and social history reviewed and updated as indicated. Interim medical history since our last visit reviewed. Allergies and medications reviewed and updated. Outpatient Medications Prior to Visit  Medication Sig Dispense Refill  . acetaminophen (TYLENOL) 500 MG tablet Take 1 tablet (500 mg total) by mouth 3 (three) times daily as  needed.    . Ascorbic Acid (VITAMIN C) 100 MG tablet Take 100 mg by mouth daily.    . aMarland Kitchenpirin EC 81 MG tablet Take 81 mg by mouth daily.    . BD ULTRA-FINE LANCETS lancets Use as instructed upto 6 times daily 600 each 2  . benzonatate (TESSALON) 100 MG capsule Take 1 capsule (100 mg total) by mouth 3 (three) times daily as needed for cough. 30 capsule 3  . clopidogrel (PLAVIX) 75 MG tablet Take 1 tablet (75 mg total) by mouth daily. 30 tablet 6  . Continuous Blood Gluc Receiver (DEXCOM G6 RECEIVER) DEVI 1 kit by Does not apply route See admin instructions. Use to check blood sugar 4 times a day 1 each 0  . Continuous Blood Gluc Sensor (DEXCOM G6 SENSOR) MISC 1 each by Does not apply route See admin instructions. Change sensor every 10 days 3 each 3  . Continuous Blood Gluc Transmit (DEXCOM G6 TRANSMITTER) MISC 1 kit by Does not apply route See admin instructions. Use to check blood sugar 1 each 1  . diclofenac Sodium (VOLTAREN) 1 % GEL Apply 2 g topically 3 (three) times daily. 100 g 3  . glucagon 1 MG injection Inject 1 mg into the muscle once as needed for up to 1 dose. 1 each 12  . glucose blood (CONTOUR NEXT TEST) test strip Use as instructed to check 4 times daily 400 each 5  . insulin aspart (NOVOLOG) 100 UNIT/ML  injection Use up to 50 units a day in the insulin pump 50 mL 3  . Insulin Human (INSULIN PUMP) SOLN Inject into the skin continuous. novolog    . Insulin Pen Needle 32G X 4 MM MISC Use 4-5x a day 300 each 3  . Krill Oil 500 MG CAPS Take 2 capsules (1,000 mg total) by mouth daily. (Patient taking differently: Take 500 mg by mouth daily. )    . losartan (COZAAR) 25 MG tablet Take 1 tablet (25 mg total) by mouth daily. 30 tablet 11  . memantine (NAMENDA) 5 MG tablet Take 1 tablet (5 mg total) by mouth 2 (two) times daily. 60 tablet 3  . metoprolol succinate (TOPROL-XL) 50 MG 24 hr tablet Take 50 mg by mouth at bedtime. Take with or immediately following a meal.    . nitroGLYCERIN  (NITROSTAT) 0.4 MG SL tablet Place 1 tablet (0.4 mg total) under the tongue every 5 (five) minutes as needed for chest pain. 30 tablet 1  . pregabalin (LYRICA) 25 MG capsule Take 1 capsule (25 mg total) by mouth daily.    . rosuvastatin (CRESTOR) 10 MG tablet TAKE 1 TABLET(10 MG) BY MOUTH DAILY 30 tablet 5  . SYNTHROID 75 MCG tablet Take 1 tablet (75 mcg total) by mouth daily before breakfast. Don't take any on Sundays    . pregabalin (LYRICA) 25 MG capsule Take 1 capsule (25 mg total) by mouth 2 (two) times daily. 60 capsule 6   No facility-administered medications prior to visit.     Per HPI unless specifically indicated in ROS section below Review of Systems Objective:  BP 140/72 (BP Location: Left Arm, Patient Position: Sitting, Cuff Size: Normal)   Pulse 87   Temp 97.7 F (36.5 C) (Temporal)   Ht '5\' 3"'  (1.6 m)   Wt 163 lb 1 oz (74 kg)   SpO2 97%   BMI 28.89 kg/m   Wt Readings from Last 3 Encounters:  07/08/20 163 lb 1 oz (74 kg)  05/25/20 163 lb (73.9 kg)  05/20/20 164 lb 4 oz (74.5 kg)      Physical Exam Vitals and nursing note reviewed.  Constitutional:      Appearance: Normal appearance. She is not ill-appearing.  HENT:     Head: Normocephalic and atraumatic.     Mouth/Throat:     Mouth: Mucous membranes are moist.     Pharynx: Oropharynx is clear.  Eyes:     Extraocular Movements: Extraocular movements intact.     Conjunctiva/sclera: Conjunctivae normal.     Pupils: Pupils are equal, round, and reactive to light.  Neck:     Vascular: No carotid bruit.  Cardiovascular:     Rate and Rhythm: Normal rate and regular rhythm.     Pulses: Normal pulses.     Heart sounds: Normal heart sounds. No murmur heard.   Pulmonary:     Effort: Pulmonary effort is normal. No respiratory distress.     Breath sounds: Normal breath sounds. No wheezing, rhonchi or rales.  Musculoskeletal:        General: Normal range of motion.     Right lower leg: No edema.     Left lower leg:  No edema.  Neurological:     General: No focal deficit present.     Mental Status: She is alert.     Cranial Nerves: Cranial nerves are intact.     Sensory: Sensation is intact.     Motor: Motor function is intact.  Coordination: Coordination is intact.     Gait: Gait is intact.     Deep Tendon Reflexes:     Reflex Scores:      Patellar reflexes are 2+ on the right side and 2+ on the left side.    Comments:  CN 2-12 intact  FTN intact  EOMI  Grip strength intact  5/5 strength BUE, BLE   Psychiatric:        Mood and Affect: Mood normal.        Behavior: Behavior normal.       Results for orders placed or performed in visit on 05/25/20  POCT glycosylated hemoglobin (Hb A1C)  Result Value Ref Range   Hemoglobin A1C 9.3 (A) 4.0 - 5.6 %   HbA1c POC (<> result, manual entry)     HbA1c, POC (prediabetic range)     HbA1c, POC (controlled diabetic range)     EKG - NSR rate 70, normal axis, intervals, no acute ST/T changes. Low voltage throughout with poor R wave progression Assessment & Plan:  This visit occurred during the SARS-CoV-2 public health emergency.  Safety protocols were in place, including screening questions prior to the visit, additional usage of staff PPE, and extensive cleaning of exam room while observing appropriate contact time as indicated for disinfecting solutions.   Problem List Items Addressed This Visit    Monckeberg's medial sclerosis   Hyperlipidemia associated with type 2 diabetes mellitus ([place])    Chronic on crestor 74m daily - will update FLP today. Last LDL above goal - discussed possible need to increase crestor dose.  The 10-year ASCVD risk score (Mikey BussingDC JBrooke Bonito, et al., 2013) is: 52.2%   Values used to calculate the score:     Age: 89years     Sex: Female     Is Non-Hispanic African American: No     Diabetic: Yes     Tobacco smoker: No     Systolic Blood Pressure: 1270mmHg     Is BP treated: Yes     HDL Cholesterol: 40.4 mg/dL     Total  Cholesterol: 212 mg/dL       Relevant Orders   Lipid panel   Fibromyalgia    Drop lyrica back to 239monce daily given pt concerns over weight gain side effect.       Relevant Medications   pregabalin (LYRICA) 25 MG capsule   Carotid stenosis, 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   Relevant Orders   VAS [place]AROTID   Amaurosis fugax, both eyes - Primary    Description of recent event concerning for transient binocular visual loss, ?amaurosis fugax. With subsequent R sided headache ?migraine (no h/o same). Will proceed with stroke workup with carotid [place]chocardiogram, EKG, consider head CT. Check FLP today. She will also call to schedule eye exam to eval for papilledema as overdue for this.  No true syncope. ?seizure given possible post ictal state. Consider neurology evaluation if recurrent or unrevealing workup. Consider intracranial vascular evaluation.       Relevant Orders   Lipid panel   EKG 12-Lead (Completed)   ECHOCARDIOGRAM COMPLETE   VAS [place]AROTID       No orders of the defined types were placed in this encounter.  Orders Placed This Encounter  Procedures  . Lipid panel  . EKG 12-Lead  . ECHOCARDIOGRAM COMPLETE    Standing Status:   Future    Standing Expiration Date:   07/08/2021    Order Specific  Question:   Where should this test be performed    Answer:   [facility]    Order Specific Question:   Perflutren DEFINITY (image enhancing agent) should be administered unless hypersensitivity or allergy exist    Answer:   Administer Perflutren    Order Specific Question:   Is a special reader required? (athlete or structural heart)    Answer:   No    Order Specific Question:   Does this study need to be read by the Structural team/Level 3 readers?    Answer:   No    Order Specific Question:   Reason for exam-Echo    Answer:   TIA  435.9 / G45.9    Order Specific Question:   Other Comments    Answer:   amaurosis fugax, eval for rpt TIA in h/o TIA.    Patient  instructions: Drop lyrica to 72m once daily.  Labs today.  I recommend further evaluation for stroke with repeat carotid ultrasounds, heart ultrasound and EKG. We may also get head CT.  Schedule eye appointment and review symptoms with them.   Follow up plan: Return if symptoms worsen or fail to improve.  JRia Bush MD

## 2020-07-08 NOTE — Assessment & Plan Note (Addendum)
Description of recent event concerning for transient binocular visual loss, ?amaurosis fugax. With subsequent R sided headache ?migraine (no h/o same). Will proceed with stroke workup with carotid US, echocardiogram, EKG, consider head CT. Check FLP today. She will also call to schedule eye exam to eval for papilledema as overdue for this.  No true syncope. ?seizure given possible post ictal state. Consider neurology evaluation if recurrent or unrevealing workup. Consider intracranial vascular evaluation.

## 2020-07-08 NOTE — Assessment & Plan Note (Signed)
Drop lyrica back to 25mg  once daily given pt concerns over weight gain side effect.

## 2020-07-08 NOTE — Patient Instructions (Addendum)
Drop lyrica to 25mg  once daily.  Labs today.  I recommend further evaluation for stroke with repeat carotid ultrasounds, heart ultrasound and EKG. We may also get head CT.  Schedule eye appointment and review symptoms with them.   Amaurosis Fugax Amaurosis fugax, also called transient visual loss, is a condition in which a person loses sight in one eye or, rarely, both eyes for a short time. The vision loss in the affected eye may be total or partial. The vision loss usually lasts for only a few seconds or minutes before sight returns to normal. In some cases, vision loss may last for several hours. This condition is typically caused by interruption of blood flow in the artery that supplies blood to the retina. The retina is the part of the eye that contains the nerves needed for sight. This condition can be a warning sign that a stroke may happen, either in the eye or in the brain. A stroke can result in permanent vision loss or loss of other body functions. What are the causes? This condition is caused by a loss or interruption of blood flow to the retinal artery. Causes for the change in blood flow include:  A buildup of cholesterol and fats, or plaque, in the artery (atherosclerosis). If any plaque breaks off and gets into the bloodstream, it can travel to other blood vessels, such as the retinal artery.  Diseases of the heart valves.  Certain blood conditions, such as sickle cell anemia, leukemia, and blood clotting (coagulation) disorders.  Inflammation of the arteries (vasculitis).  A fast or irregular heartbeat, such as atrial fibrillation.  Family history of stroke. What increases the risk? The following factors may make you more likely to develop this condition:  Use of any tobacco products, including cigarettes, chewing tobacco, or electronic cigarettes.  Poorly controlled diabetes.  Conditions that can lead to diseases of the heart and blood vessels (cardiovascular diseases),  such as: ? High blood pressure (hypertension). ? High cholesterol.  Drinking too much alcohol regularly.  Use of drugs, especially cocaine.  Age. The risk increases with age. What are the signs or symptoms? The main symptom of this condition is painless, sudden loss of vision in one eye. The vision loss often starts at the top and moves down, as if a curtain is being pulled down over your eye. This is usually followed by a quick return of vision. However, symptoms may last for several hours. It is important to seek medical care right away even if your symptoms go away. How is this diagnosed? This condition is diagnosed by:  Medical history and physical exam.  Eye exam, including dilating drops and looking at the back of your eyes.  Carotid ultrasound. This checks for plaque in the carotid arteries in your neck.  Magnetic resonance angiography (MRA). This checks the carotid artery and the branches that supply the brain. MRA looks for areas of blockage or disease. You may also have other tests, including:  Blood tests.  Electrocardiogram (ECG) to check your heart rhythm.  Echocardiogram (ECHO) to check your heart function. How is this treated? Emergency treatment for this condition may involve massaging the eyeball or using certain breathing techniques to remove or ease the blockage of the retinal artery. You may also get an emergency referral to a clinic or medical center that treats strokes. Other treatments focus on reducing your risk of having a stroke in the future. These may include:  Medicines, such as medicines to manage high blood  pressure, diabetes, or cholesterol, or to thin the blood.  Surgical procedures, such as: ? Carotid endarterectomy to remove plaque from the carotid artery. ? Carotid angioplasty and placement of a small, mesh tube (stenting) to open the blocked part of the artery.  Lifestyle changes, such as stopping tobacco use, changing your diet, and getting  enough exercise. Follow these instructions at home: Medicines  Take over-the-counter and prescription medicines only as told by your health care provider.  If you are taking blood thinners: ? Talk with your health care provider before you take any medicines that contain aspirin or NSAIDs, such as ibuprofen. These medicines increase your risk for dangerous bleeding. ? Take your medicine exactly as told, at the same time every day. ? Avoid activities that could cause injury or bruising, and follow instructions about how to prevent falls. ? Wear a medical alert bracelet or carry a card that lists what medicines you take. Eating and drinking   Eat a diet that includes five or more servings of fruits and vegetables each day. This may reduce the risk of stroke.  Certain diets may help with high blood pressure, high cholesterol, diabetes, or obesity. These include: ? A diet that is low in salt (sodium) to manage high blood pressure. ? A high-fiber diet that is low in saturated fat, trans fat, and cholesterol to control cholesterol levels. ? A low-carbohydrate, low-sugar diet to manage diabetes. ? A reduced-calorie diet that is low in sodium, saturated fat, trans fat, and cholesterol to manage obesity. Lifestyle   Maintain a healthy weight.  Stay physically active. Try to get at least 30 minutes of activity on most or all days.  Do not use any products that contain nicotine or tobacco, such as cigarettes, e-cigarettes, and chewing tobacco. If you need help quitting, ask your health care provider.  Do not misuse drugs. General instructions  Do not drink alcohol if: ? Your health care provider tells you not to drink. ? You are pregnant, may be pregnant, or are planning to become pregnant.  If you drink alcohol: ? Limit how much you use to:  0-1 drink a day for women.  0-2 drinks a day for men. ? Be aware of how much alcohol is in your drink. In the [place] one drink equals one 12 oz  bottle of beer (355 mL), one 5 oz glass of wine (148 mL), or one 1 oz glass of hard liquor (44 mL).  Keep all follow-up visits as told by your health care provider. This is important. Contact a health care provider if:  You lose vision in one eye or both eyes. Get help right away if:   You have chest pain or an irregular heartbeat.  You have any symptoms of a stroke. "BE FAST" is an easy way to remember the main warning signs of a stroke. ? B - Balance. Signs are dizziness, sudden trouble walking, or loss of balance. ? E - Eyes. Signs are trouble seeing or a sudden change in vision. ? F - Face. Signs are sudden weakness or numbness of the face, or the face or eyelid drooping on one side. ? A - Arms. Signs are weakness or numbness in an arm. This happens suddenly and usually on one side of the body. ? S - Speech. Signs are sudden trouble speaking, slurred speech, or trouble understanding what people say. ? T - Time. Time to call emergency services. Write down what time symptoms started.  You have other signs of  a stroke, such as: ? A sudden, severe headache with no known cause. ? Nausea or vomiting. ? Seizure. These symptoms may represent a serious problem that is an emergency. Do not wait to see if the symptoms will go away. Get medical help right away. Call your local emergency services (911 in the [place] Do not drive yourself to the hospital. Summary  Amaurosis fugax is a condition in which you lose sight for a short time.  This condition can be a warning sign that a stroke may happen. A stroke can result in permanent vision loss or loss of other body functions.  Seek medical care right away even if your symptoms go away. This information is not intended to replace advice given to you by your health care provider. Make sure you discuss any questions you have with your health care provider. Document Revised: 03/27/2019 Document Reviewed: 03/27/2019 Elsevier Patient Education  [place].

## 2020-07-08 NOTE — Assessment & Plan Note (Signed)
Chronic on crestor 10mg  daily - will update FLP today. Last LDL above goal - discussed possible need to increase crestor dose.  The 10-year ASCVD risk score [name]., et al., 2013) is: 52.2%   Values used to calculate the score:     Age: 79 years     Sex: Female     Is Non-Hispanic African American: No     Diabetic: Yes     Tobacco smoker: No     Systolic Blood Pressure: 038 mmHg     Is BP treated: Yes     HDL Cholesterol: 40.4 mg/dL     Total Cholesterol: 212 mg/dL

## 2020-07-09 ENCOUNTER — Telehealth: Payer: Self-pay | Admitting: Family Medicine

## 2020-07-09 LAB — LIPID PANEL
Cholesterol: 183 mg/dL (ref 0–200)
HDL: 53.5 mg/dL (ref >39.00)
LDL Cholesterol: 95 mg/dL (ref 0–99)
NonHDL: 129.6
Total CHOL/HDL Ratio: 3
Triglycerides: 175 mg/dL — ABNORMAL HIGH (ref 0.0–149.0)
VLDL: 35 mg/dL (ref 0.0–40.0)

## 2020-07-09 LAB — HIGH SENSITIVITY CRP: CRP, High Sensitivity: 5.61 mg/L — ABNORMAL HIGH (ref 0.000–5.000)

## 2020-07-09 NOTE — Telephone Encounter (Signed)
Patient called in stating she is needing a referral for [facility] for vascular surgeon. Called his office and they stated a referral will get her seen sooner. Please advise.

## 2020-07-09 NOTE — Addendum Note (Signed)
Addended by: Ellamae Sia on: 07/09/2020 07:31 AM   Modules accepted: Orders

## 2020-07-16 ENCOUNTER — Emergency Department (HOSPITAL_BASED_OUTPATIENT_CLINIC_OR_DEPARTMENT_OTHER)
Admission: EM | Admit: 2020-07-16 | Discharge: 2020-07-16 | Disposition: A | Discharge status: Home / Self Care | Payer: PPO | Attending: Emergency Medicine | Admitting: Emergency Medicine

## 2020-07-16 ENCOUNTER — Emergency Department (HOSPITAL_BASED_OUTPATIENT_CLINIC_OR_DEPARTMENT_OTHER): Payer: PPO

## 2020-07-16 ENCOUNTER — Encounter (HOSPITAL_BASED_OUTPATIENT_CLINIC_OR_DEPARTMENT_OTHER): Payer: Self-pay | Admitting: *Deleted

## 2020-07-16 ENCOUNTER — Ambulatory Visit
Admission: EM | Admit: 2020-07-16 | Discharge: 2020-07-16 | Disposition: A | Discharge status: Home / Self Care | Payer: PPO | Attending: Family Medicine | Admitting: Family Medicine

## 2020-07-16 ENCOUNTER — Other Ambulatory Visit: Payer: Self-pay

## 2020-07-16 DIAGNOSIS — E109 Type 1 diabetes mellitus without complications: Secondary | ICD-10-CM | POA: Diagnosis not present

## 2020-07-16 DIAGNOSIS — Z7982 Long term (current) use of aspirin: Secondary | ICD-10-CM | POA: Insufficient documentation

## 2020-07-16 DIAGNOSIS — G43509 Persistent migraine aura without cerebral infarction, not intractable, without status migrainosus: Secondary | ICD-10-CM | POA: Diagnosis not present

## 2020-07-16 DIAGNOSIS — I10 Essential (primary) hypertension: Secondary | ICD-10-CM | POA: Diagnosis not present

## 2020-07-16 DIAGNOSIS — Z79899 Other long term (current) drug therapy: Secondary | ICD-10-CM | POA: Insufficient documentation

## 2020-07-16 DIAGNOSIS — E039 Hypothyroidism, unspecified: Secondary | ICD-10-CM | POA: Diagnosis not present

## 2020-07-16 DIAGNOSIS — Z79890 Hormone replacement therapy (postmenopausal): Secondary | ICD-10-CM | POA: Insufficient documentation

## 2020-07-16 DIAGNOSIS — G43501 Persistent migraine aura without cerebral infarction, not intractable, with status migrainosus: Secondary | ICD-10-CM | POA: Diagnosis not present

## 2020-07-16 DIAGNOSIS — Z96641 Presence of right artificial hip joint: Secondary | ICD-10-CM | POA: Diagnosis not present

## 2020-07-16 DIAGNOSIS — Z794 Long term (current) use of insulin: Secondary | ICD-10-CM | POA: Insufficient documentation

## 2020-07-16 DIAGNOSIS — R519 Headache, unspecified: Secondary | ICD-10-CM | POA: Diagnosis not present

## 2020-07-16 LAB — CBC WITH DIFFERENTIAL/PLATELET
Abs Immature Granulocytes: 0.02 10*3/uL (ref 0.00–0.07)
Basophils Absolute: 0.1 10*3/uL (ref 0.0–0.1)
Basophils Relative: 2 %
Eosinophils Absolute: 0.1 10*3/uL (ref 0.0–0.5)
Eosinophils Relative: 1 %
HCT: 42.7 % (ref 36.0–46.0)
Hemoglobin: 13.8 g/dL (ref 12.0–15.0)
Immature Granulocytes: 0 %
Lymphocytes Relative: 27 %
Lymphs Abs: 2 10*3/uL (ref 0.7–4.0)
MCH: 26.8 pg (ref 26.0–34.0)
MCHC: 32.3 g/dL (ref 30.0–36.0)
MCV: 83.1 fL (ref 80.0–100.0)
Monocytes Absolute: 0.6 10*3/uL (ref 0.1–1.0)
Monocytes Relative: 8 %
Neutro Abs: 4.7 10*3/uL (ref 1.7–7.7)
Neutrophils Relative %: 62 %
Platelets: 199 10*3/uL (ref 150–400)
RBC: 5.14 MIL/uL — ABNORMAL HIGH (ref 3.87–5.11)
RDW: 14 % (ref 11.5–15.5)
WBC: 7.6 10*3/uL (ref 4.0–10.5)
nRBC: 0 % (ref 0.0–0.2)

## 2020-07-16 LAB — BASIC METABOLIC PANEL
Anion gap: 12 (ref 5–15)
BUN: 13 mg/dL (ref 8–23)
CO2: 24 mmol/L (ref 22–32)
Calcium: 9.7 mg/dL (ref 8.9–10.3)
Chloride: 102 mmol/L (ref 98–111)
Creatinine, Ser: 0.95 mg/dL (ref 0.44–1.00)
GFR, Estimated: >60 mL/min (ref >60)
Glucose, Bld: 270 mg/dL — ABNORMAL HIGH (ref 70–99)
Potassium: 4.4 mmol/L (ref 3.5–5.1)
Sodium: 138 mmol/L (ref 135–145)

## 2020-07-16 LAB — SEDIMENTATION RATE: Sed Rate: 20 mm/hr (ref 0–22)

## 2020-07-16 MED ORDER — METOCLOPRAMIDE HCL 5 MG/ML IJ SOLN
10.0000 mg | Freq: Once | INTRAMUSCULAR | Status: AC
  Administered 2020-07-16: 10 mg via INTRAVENOUS
  Filled 2020-07-16: qty 2

## 2020-07-16 MED ORDER — ACETAMINOPHEN 325 MG PO TABS
650.0000 mg | ORAL_TABLET | Freq: Once | ORAL | Status: AC
  Administered 2020-07-16: 650 mg via ORAL

## 2020-07-16 NOTE — ED Notes (Signed)
Patient is being discharged from the Urgent Care and sent to the Emergency Department via POV driven by daughter . Per stephanie, NP, patient is in need of higher level of care due to severe migraine with history of clotting disorders. Patient is aware and verbalizes understanding of plan of care.  Vitals:   07/16/20 1550  BP: (!) 146/85  Pulse: 83  Resp: 15  Temp: 98.3 F (36.8 C)  SpO2: 100%

## 2020-07-16 NOTE — Discharge Instructions (Signed)
The sedimentation rate today was normal at 20.  The head CT was normal.  If you start having constant vision change or trouble walking or one sided weakness please return to the ER.

## 2020-07-16 NOTE — ED Triage Notes (Signed)
Patient presents to Urgent Care with complaints of right sided headache since about 3 days ago. Patient reports 3 weeks ago it got so bad all her vision was black. Saw PCP for it and was told she should come here if it happened again and take some aspirin or "regular headache medicine". Pt had right temporal artery surgery in 2019 and it hurts in the same place. Pt has not taken anything for pain today.No neuro deficits at this time.

## 2020-07-16 NOTE — ED Provider Notes (Signed)
[facility] EMERGENCY DEPARTMENT Provider Note   CSN: [id number] Arrival date & time: 07/16/20  1740     History Chief Complaint  Patient presents with  . Headache    Cassandra Benson is a 79 y.o. female.  Patient is a 79 year old female with a history of hypertension, hyperlipidemia, type 1 diabetes, dermatomyositis, carotid stenosis status post surgery in 2016, Sjogren's syndrome, glaucoma who is presenting today with ongoing headache and abnormal vision.  She reports that this started 3 weeks ago.  Patient reports 3 weeks ago she was getting ready to go to church when she developed a sudden vision loss in both which then developed into blurry vision and then vision returned but she developed a headache.  She reports the headache has been constant now for 3 weeks but sometimes is more severe than others.  She has not had any other vision changes on the left but continues to have intermittent vision changes on the right.  She reports sometimes half of her vision disappears and then will turn into blurry vision and then will go back to normal.  She reports right now her vision just seems a little blurry.  The headache has seemed to stay in the right temporal and parietal areas but does not radiate down into the neck.  She has had no facial droop, unilateral weakness numbness or difficulty walking.  She has had no difficulty with her speech.  She denies any recent medication changes and continues to take her Plavix.  She has been having issues with her insulin pump so has been doing subcu insulin.  She reports her sugars are always difficult to control but has not been significantly different.  Prior to 3 weeks ago she has never had headaches like this.  The history is provided by the patient.  Headache Pain location:  R temporal      Past Medical History:  Diagnosis Date  . Allergy   . ANA positive    positive ANA pattern 1 speckled  . Arthritis   . Carotid stenosis,  asymptomatic 06/19/2015   7-03% RICA 50-09% LICA rpt 1 yr (11/8180)   . Colon polyps   . Dermatomyositis ([place])   . Diabetes mellitus without complication (HCC)    Type 1  . Diverticulosis    sigmoid on CT scan 12/2019  . Family history of adverse reaction to anesthesia    brothr went into cardiac arrest from anectine  . Fibromyalgia    prior PCP  . GERD (gastroesophageal reflux disease)    prior PCP  . Glaucoma    Narrow angle  . History of blood clots    DVT, in 20s, none since  . History of chicken pox   . History of diverticulitis   . History of pericarditis 1986   with hospitalization  . History of pneumonia 2014  . History of shingles   . History of UTI   . Hyperlipidemia   . Hypertension   . Hypothyroidism   . Mixed connective tissue disease ([place])   . Partial small bowel obstruction ([place]) 12/2019   managed conservatively  . Peptic ulcer   . Pneumonia   . PONV (postoperative nausea and vomiting)   . Raynaud's disease without gangrene   . Shoulder pain left   h/o RTC tendonitis and adhesive capsulitis  . Sjogren's syndrome ([place])   . Sleep apnea    prior PCP - no CPAP for about 10 yrs  . 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[place])   .  Vitamin D deficiency    prior PCP    Patient Active Problem List   Diagnosis Date Noted  . Chronic venous insufficiency 04/07/2020  . Bowel habit changes 02/18/2020  . Diverticulosis   . Partial small bowel obstruction ([place]) 01/15/2020  . Epistaxis 01/13/2020  . Cough 01/05/2020  . MCI (mild cognitive impairment) 10/22/2019  . Right sided abdominal pain 10/07/2019  . Skin lesion 09/14/2019  . Renal insufficiency 09/14/2019  . Pain in right buttock 06/18/2019  . Chronic midline thoracic back pain 05/14/2019  . Sjogren's syndrome without extraglandular involvement ([place]) 02/26/2019  . Epigastric discomfort 12/17/2018  . PAD (peripheral artery disease) ([place]) 10/04/2018  . Raynaud disease 09/04/2018  . Amaurosis fugax, both eyes 06/18/2018  . TIA  (transient ischemic attack) 05/21/2018  . 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 Facial paresthesia 02/07/2018  . Pain and swelling of lower leg 12/25/2017  . Fibromyalgia   . 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 Intractable episodic headache 03/13/2017  . Numbness and tingling of both lower extremities 03/13/2017  . 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 Fatty liver 02/13/2017  . LADA (latent autoimmune diabetes in adults), managed as type 1 ([place]) 10/03/2016  . Medicare annual wellness visit, subsequent 06/19/2015  . Health maintenance examination 06/19/2015  . Advanced care planning/counseling discussion 06/19/2015  . 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 Vitamin D deficiency   . Family history of premature CAD 06/02/2015  . Chest pain 05/26/2015  . 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 Hyperlipidemia associated with type 2 diabetes mellitus ([place]) 07/30/2014  . Essential hypertension 07/17/2014  . Hypothyroidism due to Hashimoto's thyroiditis 07/17/2014  . Adjustment disorder with mixed anxiety and depressed mood 07/17/2014  . Apnea, sleep 08/22/2012  . LBP (low back pain) 02/27/2012  . Positive ANA (antinuclear antibody) 01/06/2012    Past Surgical History:  Procedure Laterality Date  . ABDOMINAL HYSTERECTOMY  1978   fibroids and menorrhagia, ovaries remain  . ARTERY BIOPSY Right 04/06/2018   Procedure: BIOPSY TEMPORAL ARTERY RIGHT;  Surgeon: Beverly Gust, MD;  Location: [place];  Service: ENT;  Laterality: Right;  Diabetic - insulin pump sleep apnea  . [facility] normal per patient  . COLONOSCOPY  10/2011   1 TA, 1 HP, very tortuous colon ([place])  . COLONOSCOPY WITH ESOPHAGOGASTRODUODENOSCOPY (EGD)  03/2007   2 ulcers, benign polyp, rpt 5 yrs ([place], [place])  . JOINT REPLACEMENT Right    hip  . PARTIAL HIP ARTHROPLASTY  2013   Right hip  replacement  . TONSILLECTOMY    . TONSILLECTOMY AND ADENOIDECTOMY    . TRANSCAROTID ARTERY REVASCULARIZATION Left 07/23/2018   Procedure: TRANSCAROTID ARTERY REVASCULARIZATION;  Surgeon: Marty Heck, MD;  Location: [place];  Service: Vascular;  Laterality: Left;  . TUBAL LIGATION    . VAGINAL DELIVERY     x2, no complications     OB History   No obstetric history on file.     Family History  Problem Relation Age of Onset  . CAD Mother 68       MI, aortic valve issues  . COPD Mother   . Lupus Mother   . Graves' disease Mother   . Rheum arthritis Mother   . CAD Father 24       CABG x2, aortic valve replacement  . Stroke Sister   . CAD Sister   . Anuerysm Sister  brain  . Lupus Sister   . Diabetes Sister   . Alcohol abuse Brother   . CAD Brother 20       MI  . Stroke Brother   . Seizures Son   . COPD Brother        agent orange  . CAD Brother 43       stent  . Diabetes Brother   . Depression Grandchild   . Breast cancer Maternal Aunt   . Diabetes Sister   . Breast cancer Sister   . Breast cancer Maternal Aunt   . Stroke Maternal Grandmother   . Hypertension Maternal Grandmother   . Gallbladder disease Maternal Grandmother   . Colon cancer Neg Hx   . Esophageal cancer Neg Hx   . Rectal cancer Neg Hx   . Stomach cancer Neg Hx     Social History   Tobacco Use  . Smoking status: Never Smoker  . Smokeless tobacco: Never Used  Vaping Use  . Vaping Use: Never used  Substance Use Topics  . Alcohol use: No  . Drug use: No    Home Medications Prior to Admission medications   Medication Sig Start Date End Date Taking? Authorizing Provider  acetaminophen (TYLENOL) 500 MG tablet Take 1 tablet (500 mg total) by mouth 3 (three) times daily as needed. 01/03/20   [name], MD  Ascorbic Acid (VITAMIN C) 100 MG tablet Take 100 mg by mouth daily.    [provider]  aspirin EC 81 MG tablet Take 81 mg by mouth daily.    [provider]  BD ULTRA-FINE LANCETS lancets Use as instructed upto 6 times daily 09/02/14   Phadke, Radhika P, MD  benzonatate (TESSALON) 100 MG capsule Take 1 capsule (100 mg total) by mouth 3 (three) times daily as needed for cough. 05/20/20   [name], MD  clopidogrel (PLAVIX) 75 MG tablet Take 1 tablet (75 mg total) by mouth daily. 09/03/19   Marty Heck, MD  Continuous Blood Gluc Receiver (DEXCOM G6 RECEIVER) DEVI 1 kit by Does not apply route See admin instructions. Use to check blood sugar 4 times a day 09/18/19   Philemon Kingdom, MD  Continuous Blood Gluc Sensor (DEXCOM G6 SENSOR) MISC 1 each by Does not apply route See admin instructions. Change sensor every 10 days 09/18/19   Philemon Kingdom, MD  Continuous Blood Gluc Transmit (DEXCOM G6 TRANSMITTER) MISC 1 kit by Does not apply route See admin instructions. Use to check blood sugar 09/18/19   Philemon Kingdom, MD  diclofenac Sodium (VOLTAREN) 1 % GEL Apply 2 g topically 3 (three) times daily. 03/15/20   [name], MD  glucagon 1 MG injection Inject 1 mg into the muscle once as needed for up to 1 dose. 01/24/20   Philemon Kingdom, MD  glucose blood (CONTOUR NEXT TEST) test strip Use as instructed to check 4 times daily 07/03/17   Philemon Kingdom, MD  insulin aspart (NOVOLOG) 100 UNIT/ML injection Use up to 50 units a day in the insulin pump 12/10/19   Philemon Kingdom, MD  Insulin Human (INSULIN PUMP) SOLN Inject into the skin continuous. novolog    [provider]  Insulin Pen Needle 32G X 4 MM MISC Use 4-5x a day 11/06/18   Philemon Kingdom, MD  Javier Docker Oil 500 MG CAPS Take 2 capsules (1,000 mg total) by mouth daily. Patient taking differently: Take 500 mg by mouth daily.  09/20/17   [name], MD  losartan (  COZAAR) 25 MG tablet Take 1 tablet (25 mg total) by mouth daily. 02/18/20   [name], MD  memantine (NAMENDA) 5 MG tablet Take 1 tablet (5 mg total) by mouth 2 (two) times daily.  02/07/20   [name], MD  metoprolol succinate (TOPROL-XL) 50 MG 24 hr tablet Take 50 mg by mouth at bedtime. Take with or immediately following a meal.    [provider]  nitroGLYCERIN (NITROSTAT) 0.4 MG SL tablet Place 1 tablet (0.4 mg total) under the tongue every 5 (five) minutes as needed for chest pain. 07/31/18   [name], MD  pregabalin (LYRICA) 25 MG capsule Take 1 capsule (25 mg total) by mouth daily. 07/08/20   [name], MD  rosuvastatin (CRESTOR) 10 MG tablet TAKE 1 TABLET(10 MG) BY MOUTH DAILY 03/27/20   Angelia Mould, MD  SYNTHROID 75 MCG tablet Take 1 tablet (75 mcg total) by mouth daily before breakfast. Don't take any on Sundays 05/20/20   [name], MD    Allergies    Iodinated diagnostic agents, Penicillins, Amlodipine, Anectine [succinylcholine], Codeine, Gabapentin, Influenza vaccines, Nortriptyline, Pamelor [nortriptyline hcl], Valsartan, Erythromycin, and Sulfa antibiotics  Review of Systems   Review of Systems  Cardiovascular:       Intermittent chest tightness that goes all the way down the center of her chest.  It comes and goes.  It does not seem to be related to exertion.  She is currently not having any pain.  Neurological: Positive for headaches.  All other systems reviewed and are negative.   Physical Exam Updated Vital Signs Pulse 66   Temp 97.8 F (36.6 C) (Oral)   Resp 16   Ht 5' 3.5" (1.613 m)   Wt 68 kg   SpO2 98%   BMI 26.15 kg/m   Physical Exam Vitals and nursing note reviewed.  Constitutional:      General: She is not in acute distress.    Appearance: She is well-developed and normal weight.  HENT:     Head: Normocephalic and atraumatic.     Comments: Tenderness over the right temple    Mouth/Throat:     Mouth: Mucous membranes are moist.  Eyes:     General: No visual field deficit.    Extraocular Movements: Extraocular movements intact.     Pupils: Pupils are equal, round, and reactive  to light.     Comments: No papilledema or hemorrhages noted on funduscopic exam  Neck:     Vascular: No carotid bruit.  Cardiovascular:     Rate and Rhythm: Normal rate and regular rhythm.     Pulses: Normal pulses.     Heart sounds: Normal heart sounds. No murmur heard.  No friction rub.  Pulmonary:     Effort: Pulmonary effort is normal.     Breath sounds: Normal breath sounds. No wheezing or rales.  Abdominal:     General: Bowel sounds are normal. There is no distension.     Palpations: Abdomen is soft.     Tenderness: There is no abdominal tenderness. There is no guarding or rebound.  Musculoskeletal:        General: No tenderness. Normal range of motion.     Cervical back: Normal range of motion and neck supple.     Right lower leg: No edema.     Left lower leg: No edema.     Comments: No edema  Skin:    General: Skin is warm and dry.     Findings:  No rash.  Neurological:     General: No focal deficit present.     Mental Status: She is alert and oriented to person, place, and time. Mental status is at baseline.     Cranial Nerves: No cranial nerve deficit, dysarthria or facial asymmetry.     Sensory: No sensory deficit.     Motor: No weakness or pronator drift.     Coordination: Coordination is intact.     Gait: Gait is intact.  Psychiatric:        Mood and Affect: Mood normal.        Behavior: Behavior normal.        Thought Content: Thought content normal.     ED Results / Procedures / Treatments   Labs (all labs ordered are listed, but only abnormal results are displayed) Labs Reviewed  CBC WITH DIFFERENTIAL/PLATELET - Abnormal; Notable for the following components:      Result Value   RBC 5.14 (*)    All other components within normal limits  BASIC METABOLIC PANEL - Abnormal; Notable for the following components:   Glucose, Bld 270 (*)    All other components within normal limits  SEDIMENTATION RATE    EKG EKG Interpretation  Date/Time:  Thursday  July 16 2020 18:43:58 EDT Ventricular Rate:  63 PR Interval:    QRS Duration: 86 QT Interval:  419 QTC Calculation: 429 R Axis:   74 Text Interpretation: Sinus rhythm Low voltage, extremity and precordial leads No significant change since last tracing Confirmed by Blanchie Dessert [id number]) on 07/16/2020 7:05:30 PM   Radiology CT Head Wo Contrast  Result Date: 07/16/2020 CLINICAL DATA:  New onset headaches with blurry vision EXAM: CT HEAD WITHOUT CONTRAST TECHNIQUE: Contiguous axial images were obtained from the base of the skull through the vertex without intravenous contrast. COMPARISON:  06/07/2018 FINDINGS: Brain: Chronic atrophic changes are noted commensurate with the patient's given age. No findings to suggest acute hemorrhage, acute infarction or space-occupying mass lesion are noted. Vascular: No hyperdense vessel or unexpected calcification. Skull: Normal. Negative for fracture or focal lesion. Sinuses/Orbits: No acute finding. Other: None. IMPRESSION: Chronic atrophic changes without acute abnormality. Electronically Signed   By: Inez Catalina M.D.   On: 07/16/2020 19:02    Procedures Procedures (including critical care time)  Medications Ordered in ED Medications  metoCLOPramide (REGLAN) injection 10 mg (10 mg Intravenous Given 07/16/20 1830)    ED Course  I have reviewed the triage vital signs and the nursing notes.  Pertinent labs & imaging results that were available during my care of the patient were reviewed by me and considered in my medical decision making (see chart for details).    MDM Rules/Calculators/A&P                          Elderly female with multiple medical problems including Sjogren's disease, autoimmune disease with positive ANA who is presenting today with headache and visual loss.  Patient symptoms sound most classic for migraine with aura as she gets the visual symptoms shortly before the headache starts in the vision improves.  Patient has no  focal neurologic symptoms on exam at this time.  She has no signs of abnormality on funduscopic exam concerning for central retinal artery occlusion or vein occlusion.  Patient has normal vision at this time.  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  However  today patient's sedimentation rate is 20 and reassuring with low suspicion for temporal arteritis.  Head CT negative for any acute tumors or bleed.  Low suspicion for stroke at this time as patient's neuro exam is within normal limits.  Labs are reassuring.  After Reglan patient reports her headache is significantly improved.  At this time feel that patient is stable for discharge but encourage close follow-up with neurology.  EKG wnl and low suspicion for ACS at this time.  Pt reports the pain is intermittent and no exertional.  She has not had it in awhile and not having it in the last few days.  Suspect GI in nature and low suspicion for ACS, PE or dissection.   MDM Number of Diagnoses or Management Options   Amount and/or Complexity of Data Reviewed Clinical lab tests: ordered and reviewed Tests in the radiology section of CPT: ordered and reviewed Tests in the medicine section of CPT: ordered and reviewed Decide to obtain previous medical records or to obtain history from someone other than the patient: yes Obtain history from someone other than the patient: no Review and summarize past medical records: yes Discuss the patient with other providers: no Independent visualization of images, tracings, or specimens: yes  Risk of Complications, Morbidity, and/or Mortality Presenting problems: moderate Diagnostic procedures: low Management options: low  Patient Progress Patient progress: improved  Final Clinical Impression(s) / ED Diagnoses Final diagnoses:  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

## 2020-07-16 NOTE — ED Triage Notes (Signed)
C/o Headache with blurred vision  x 3 days

## 2020-07-17 ENCOUNTER — Other Ambulatory Visit: Payer: Self-pay | Admitting: Family Medicine

## 2020-07-17 DIAGNOSIS — G459 Transient cerebral ischemic attack, unspecified: Secondary | ICD-10-CM

## 2020-07-17 DIAGNOSIS — G453 Amaurosis fugax: Secondary | ICD-10-CM

## 2020-07-23 ENCOUNTER — Telehealth: Payer: Self-pay | Admitting: Family Medicine

## 2020-07-23 ENCOUNTER — Telehealth: Payer: Self-pay

## 2020-07-23 NOTE — Telephone Encounter (Signed)
[facility] Night - Client Nonclinical Telephone Record AccessNurse Client [place] Night - Client Client Site [place] Physician [name] - MD Contact Type Call Who Is Calling Patient / Member / Family / Caregiver Caller Name [place] Phone Number [phone] Call Type Message Only Information Provided Reason for Call Returning a Call from the Office Initial [place] had a missed call Additional Comment Office hours provided. Disp. Time Disposition Final User 07/22/2020 5:10:32 PM General Information Provided Yes Doree Fudge Call Closed By: Doree Fudge Transaction Date/Time: 07/22/2020 5:07:43 PM (ET)

## 2020-07-23 NOTE — Telephone Encounter (Signed)
Did you call pt?

## 2020-07-25 ENCOUNTER — Encounter: Payer: Self-pay | Admitting: Family Medicine

## 2020-07-27 ENCOUNTER — Ambulatory Visit: Payer: PPO | Admitting: Family Medicine

## 2020-07-27 NOTE — Telephone Encounter (Signed)
Error

## 2020-07-30 ENCOUNTER — Ambulatory Visit (INDEPENDENT_AMBULATORY_CARE_PROVIDER_SITE_OTHER): Payer: PPO

## 2020-07-30 ENCOUNTER — Other Ambulatory Visit: Payer: Self-pay

## 2020-07-30 DIAGNOSIS — I6522 Occlusion and stenosis of left carotid artery: Secondary | ICD-10-CM | POA: Diagnosis not present

## 2020-07-30 DIAGNOSIS — G453 Amaurosis fugax: Secondary | ICD-10-CM | POA: Diagnosis not present

## 2020-07-30 DIAGNOSIS — G459 Transient cerebral ischemic attack, unspecified: Secondary | ICD-10-CM

## 2020-07-30 LAB — ECHOCARDIOGRAM COMPLETE
AR max vel: 2.2 cm2
AV Area VTI: 2.36 cm2
AV Area mean vel: 2.17 cm2
AV Mean grad: 3 mmHg
AV Peak grad: 6.8 mmHg
Ao pk vel: 1.3 m/s
Area-P 1/2: 3.3 cm2
Calc EF: 52.9 %
S' Lateral: 3.4 cm
Single Plane A2C EF: 53 %
Single Plane A4C EF: 53.6 %

## 2020-08-01 ENCOUNTER — Encounter: Payer: Self-pay | Admitting: Family Medicine

## 2020-08-01 DIAGNOSIS — I5189 Other ill-defined heart diseases: Secondary | ICD-10-CM | POA: Insufficient documentation

## 2020-08-03 NOTE — Progress Notes (Signed)
Cardiology Office Note  Date:  08/04/2020   ID:  Cassandra Benson, DOB August 16, 1941, MRN [id number]  PCP:  Cassandra Bush, MD   Chief Complaint  Patient presents with  . office visit    6 month F/U; Meds verbally reviewed with patient.    HPI:  [name] is a very pleasant 79 year old woman with  15 years of diabetes, on insulin,  Hyperlipidemia, family history of coronary artery disease,  prior episodes of chest pain,  80% stenosis of the left internal carotid artery.  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 Sed rate 20 She was concerned she had temporal arteritis  Sugars running high, she is working with endocrine  Pressure elevated today, "did not take pills yet" 170/88 Typically does not run this high  Last stress test July 2020 Low risk study, good perfusion  Walks with sister around streets In general feeling well apart from headaches  Mild leg swelling, vein issues Scheduled to see vascular  Labs reviewed HBA1C 9.3, managed by endocrine Total chol 183 up for 155  Leg cramps  raynauds of feet  EKG personally reviewed by myself on todays visit Shows normal sinus rhythm rate 76 bpm no significant ST or T wave changes  Other past medical history reviewed  stressful 12/22/15 husband died in 22-Dec-2015, cancer Was diagnosed late spring, died several months later from metastatic disease lonely at nighttime  Previous episodes of chest pain,  woke up with some chest discomfort.  Went to the  emergency room, cardiac enzymes negative 3,  chest CT with no PE.   She does report prior cardiac catheterization in 21-Dec-1998 showing no significant disease CT scan done 04/2015 in the emergency room shows no coronary calcifications, minimal minimal descending aorta atherosclerosis   PMH:   has a past medical history of Allergy, ANA positive, Arthritis, Carotid stenosis, asymptomatic (06/19/2015), Colon polyps, Dermatomyositis ([place]), Diabetes mellitus without complication ([place]), Diverticulosis, Family history of adverse reaction to anesthesia, Fibromyalgia, GERD (gastroesophageal reflux disease), Glaucoma, History of blood clots, History of chicken pox, History of diverticulitis, History of pericarditis 1984/12/21), History of pneumonia Dec 21, 2012), History of shingles, History of UTI, Hyperlipidemia, Hypertension, Hypothyroidism, Mixed connective tissue disease ([place]), Partial small bowel obstruction ([place]) (12/2019), Peptic ulcer, Pneumonia, PONV (postoperative nausea and vomiting), Raynaud's disease without gangrene, Shoulder pain (left), Sjogren's syndrome ([place]), Sleep apnea, 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  PSH:    Past Surgical History:  Procedure Laterality Date  . ABDOMINAL HYSTERECTOMY  1978   fibroids and menorrhagia, ovaries remain  . ARTERY BIOPSY Right 04/06/2018   Procedure: BIOPSY TEMPORAL ARTERY RIGHT;  Surgeon: Cassandra Gust, MD;  Location: [facility];  Service: ENT;  Laterality: Right;  Diabetic - insulin pump sleep apnea  . [facility] normal per patient  . COLONOSCOPY  22-Dec-2011   1 TA, 1 HP, very tortuous colon ([place])  . COLONOSCOPY WITH ESOPHAGOGASTRODUODENOSCOPY (EGD)  03/2007   2 ulcers, benign polyp, rpt 5 yrs ([place])  . JOINT REPLACEMENT Right    hip  .  PARTIAL HIP ARTHROPLASTY  2013   Right hip replacement  . TONSILLECTOMY    . TONSILLECTOMY AND ADENOIDECTOMY    . TRANSCAROTID ARTERY  REVASCULARIZATION Left 07/23/2018   Procedure: TRANSCAROTID ARTERY REVASCULARIZATION;  Surgeon: Marty Heck, MD;  Location: [facility];  Service: Vascular;  Laterality: Left;  . TUBAL LIGATION    . VAGINAL DELIVERY     x2, no complications    Current Outpatient Medications  Medication Sig Dispense Refill  . acetaminophen (TYLENOL) 500 MG tablet Take 1 tablet (500 mg total) by mouth 3 (three) times daily as needed.    . Ascorbic Acid (VITAMIN C) 100 MG tablet Take 100 mg by mouth daily.    Marland Kitchen aspirin EC 81 MG tablet Take 81 mg by mouth daily.    . BD ULTRA-FINE LANCETS lancets Use as instructed upto 6 times daily 600 each 2  . benzonatate (TESSALON) 100 MG capsule Take 1 capsule (100 mg total) by mouth 3 (three) times daily as needed for cough. 30 capsule 3  . clopidogrel (PLAVIX) 75 MG tablet Take 1 tablet (75 mg total) by mouth daily. 30 tablet 6  . Continuous Blood Gluc Transmit (DEXCOM G6 TRANSMITTER) MISC 1 kit by Does not apply route See admin instructions. Use to check blood sugar 1 each 1  . diclofenac Sodium (VOLTAREN) 1 % GEL Apply 2 g topically 3 (three) times daily. 100 g 3  . glucagon 1 MG injection Inject 1 mg into the muscle once as needed for up to 1 dose. 1 each 12  . glucose blood (CONTOUR NEXT TEST) test strip Use as instructed to check 4 times daily 400 each 5  . insulin aspart (NOVOLOG) 100 UNIT/ML injection Sliding scale-no more than 50 units per day    . Insulin Pen Needle 32G X 4 MM MISC Use 4-5x a day 300 each 3  . losartan (COZAAR) 25 MG tablet Take 1 tablet (25 mg total) by mouth daily. 30 tablet 11  . memantine (NAMENDA) 5 MG tablet Take 1 tablet (5 mg total) by mouth 2 (two) times daily. 60 tablet 3  . metoprolol succinate (TOPROL-XL) 50 MG 24 hr tablet Take 50 mg by mouth at bedtime. Take with or immediately following a meal.    . nitroGLYCERIN (NITROSTAT) 0.4 MG SL tablet Place 1 tablet (0.4 mg total) under the tongue every 5 (five) minutes as needed for  chest pain. 30 tablet 1  . rosuvastatin (CRESTOR) 10 MG tablet TAKE 1 TABLET(10 MG) BY MOUTH DAILY 30 tablet 5  . SYNTHROID 75 MCG tablet Take 1 tablet (75 mcg total) by mouth daily before breakfast. Don't take any on Sundays    . pregabalin (LYRICA) 25 MG capsule Take 1 capsule (25 mg total) by mouth daily. (Patient not taking: Reported on 08/04/2020)     No current facility-administered medications for this visit.    Allergies:   Iodinated diagnostic agents, Penicillins, Amlodipine, Anectine [succinylcholine], Codeine, Gabapentin, Influenza vaccines, Nortriptyline, Pamelor [nortriptyline hcl], Valsartan, Erythromycin, and Sulfa antibiotics   Social History:  The patient  reports that she has never smoked. She has never used smokeless tobacco. She reports that she does not drink alcohol and does not use drugs.   Family History:   family history includes Alcohol abuse in her brother; Anuerysm in her sister; Breast cancer in her maternal aunt, maternal aunt, and sister; CAD in her sister; CAD (age of onset: 14) in her brother; CAD (age of onset: 87) in her brother; CAD (  age of onset: 55) in her father and mother; COPD in her brother and mother; Depression in her grandchild; Diabetes in her brother, sister, and sister; Gallbladder disease in her maternal grandmother; [name] in her mother; Hypertension in her maternal grandmother; Lupus in her mother and sister; Rheum arthritis in her mother; Seizures in her son; Stroke in her brother, maternal grandmother, and sister.    Review of Systems: Review of Systems  Constitutional: Positive for malaise/fatigue.  HENT: Negative.   Respiratory: Negative.   Cardiovascular: Negative.   Gastrointestinal: Negative.   Musculoskeletal: Negative.   Neurological: Positive for headaches.  Psychiatric/Behavioral: Negative.   All other systems reviewed and are negative.   PHYSICAL EXAM: VS:  BP (!) 170/88 (BP Location: Left Arm, Patient Position:  Sitting, Cuff Size: Normal)   Pulse 76   Ht '5\' 3"'  (1.6 m)   Wt 160 lb (72.6 kg)   SpO2 98%   BMI 28.34 kg/m  , BMI Body mass index is 28.34 kg/m. Constitutional:  oriented to person, place, and time. No distress.  HENT:  Head: Grossly normal Eyes:  no discharge. No scleral icterus.  Neck: No JVD, no carotid bruits  Cardiovascular: Regular rate and rhythm, no murmurs appreciated Pulmonary/Chest: Clear to auscultation bilaterally, no wheezes or rails Abdominal: Soft.  no distension.  no tenderness.  Musculoskeletal: Normal range of motion Neurological:  normal muscle tone. Coordination normal. No atrophy Skin: Skin warm and dry Psychiatric: normal affect, pleasant  Recent Labs: 11/27/2019: TSH 1.73 01/14/2020: ALT 12 03/05/2020: B Natriuretic Peptide 149.2 07/16/2020: BUN 13; Creatinine, Ser 0.95; Hemoglobin 13.8; Platelets 199; Potassium 4.4; Sodium 138    Lipid Panel Lab Results  Component Value Date   CHOL 183 07/08/2020   HDL 53.50 07/08/2020   LDLCALC 95 07/08/2020   TRIG 175.0 (H) 07/08/2020      Wt Readings from Last 3 Encounters:  08/04/20 160 lb (72.6 kg)  07/16/20 150 lb (68 kg)  07/08/20 163 lb 1 oz (74 kg)     ASSESSMENT AND PLAN:  Essential hypertension - Plan: EKG 12-Lead Did not take metoprolol yet Other PMD visits, sbp typically 140 Suggested she take metoprolol, recheck pressure at home, Has recheck with PMD this afternoon  LADA (latent autoimmune diabetes in adults), managed as type 1 ([place]) Working with endocrine   bilateral carotid artery stenosis Tolerating Crestor 10 mg daily Recent stent on the left Start zetia 10 daily  Adjustment disorder with mixed anxiety and depressed mood  loss of her husband, as well as other family members Walks with sister  Chest pain Typical and atypical features Prior stress test no ischemia  Pure hypercholesterolemia Tolerating Crestor 10,  Some cramping Adding zetia   Total encounter time more than  25 minutes  Greater than 50% was spent in counseling and coordination of care with the patient    Orders Placed This Encounter  Procedures  . EKG 12-Lead     Signed, Esmond Plants, M.D., Ph.D. 08/04/2020  [place], [place]

## 2020-08-04 ENCOUNTER — Ambulatory Visit (INDEPENDENT_AMBULATORY_CARE_PROVIDER_SITE_OTHER): Payer: PPO | Admitting: Family Medicine

## 2020-08-04 ENCOUNTER — Ambulatory Visit: Payer: PPO | Admitting: Cardiovascular Disease

## 2020-08-04 ENCOUNTER — Encounter: Payer: Self-pay | Admitting: Family Medicine

## 2020-08-04 ENCOUNTER — Encounter: Payer: Self-pay | Admitting: Cardiovascular Disease

## 2020-08-04 ENCOUNTER — Other Ambulatory Visit: Payer: Self-pay

## 2020-08-04 VITALS — BP 140/74 | HR 76 | Ht 63.0 in | Wt 160.0 lb

## 2020-08-04 VITALS — BP 140/68 | HR 98 | Temp 97.7°F | Ht 63.5 in | Wt 161.4 lb

## 2020-08-04 DIAGNOSIS — E785 Hyperlipidemia, unspecified: Secondary | ICD-10-CM

## 2020-08-04 DIAGNOSIS — I7090 Unspecified atherosclerosis: Secondary | ICD-10-CM | POA: Diagnosis not present

## 2020-08-04 DIAGNOSIS — E1169 Type 2 diabetes mellitus with other specified complication: Secondary | ICD-10-CM | POA: Diagnosis not present

## 2020-08-04 DIAGNOSIS — E139 Other specified diabetes mellitus without complications: Secondary | ICD-10-CM

## 2020-08-04 DIAGNOSIS — I739 Peripheral vascular disease, unspecified: Secondary | ICD-10-CM | POA: Diagnosis not present

## 2020-08-04 DIAGNOSIS — M3500 Sicca syndrome, unspecified: Secondary | ICD-10-CM

## 2020-08-04 DIAGNOSIS — G453 Amaurosis fugax: Secondary | ICD-10-CM

## 2020-08-04 DIAGNOSIS — E782 Mixed hyperlipidemia: Secondary | ICD-10-CM | POA: Diagnosis not present

## 2020-08-04 DIAGNOSIS — I10 Essential (primary) hypertension: Secondary | ICD-10-CM

## 2020-08-04 DIAGNOSIS — M797 Fibromyalgia: Secondary | ICD-10-CM

## 2020-08-04 DIAGNOSIS — R519 Headache, unspecified: Secondary | ICD-10-CM | POA: Diagnosis not present

## 2020-08-04 DIAGNOSIS — I6523 Occlusion and stenosis of bilateral carotid arteries: Secondary | ICD-10-CM

## 2020-08-04 DIAGNOSIS — I872 Venous insufficiency (chronic) (peripheral): Secondary | ICD-10-CM | POA: Diagnosis not present

## 2020-08-04 DIAGNOSIS — Z794 Long term (current) use of insulin: Secondary | ICD-10-CM

## 2020-08-04 MED ORDER — EZETIMIBE 10 MG PO TABS: 10.0000 mg | ORAL_TABLET | Freq: Every day | ORAL | 3 refills | Status: DC

## 2020-08-04 NOTE — Patient Instructions (Addendum)
Ensure good water intake.  We will refer you back to Dr Melrose Nakayama neurology for evaluation of ongoing headache. Start monitoring blood pressures closely  Keep appointment for next month.

## 2020-08-04 NOTE — Assessment & Plan Note (Addendum)
Saw cards today, pt states crestor increased to 20mg  daily however reviewing Dr Donivan Scull note, crestor continued at 10mg , zetia 10mg  added to regimen.

## 2020-08-04 NOTE — Assessment & Plan Note (Signed)
?  migraine related - had negative evaluation for temporal arteritis 2019 (ESR normal, temporal artery biopsy normal). Will refer to neuro for further evaluation of ongoing headaches ?migraines.

## 2020-08-04 NOTE — Assessment & Plan Note (Signed)
No further episodes since last month. Reassuring workup to date (echocardiogram, carotid US, head CT. ?migraine related vision changes given associated headache. She did improve with reglan at ER. Will refer to neurology for further evaluation (she has previously seen Dr Melrose Nakayama).

## 2020-08-04 NOTE — Assessment & Plan Note (Signed)
Continue lower lyrica 25mg  once daily dose. Pt noted weight gain on higher dose.

## 2020-08-04 NOTE — Progress Notes (Signed)
This visit was conducted in person.  BP 140/68 (BP Location: Left Arm, Patient Position: Sitting, Cuff Size: Normal)   Pulse 98   Temp 97.7 F (36.5 C) (Temporal)   Ht 5' 3.5" (1.613 m)   Wt 161 lb 7 oz (73.2 kg)   SpO2 96%   BMI 28.15 kg/m    CC: 1 month f/u visit  Subjective:    Patient ID: Cassandra Benson, female    DOB: 21-Apr-1941, 79 y.o.   MRN: [id number]  HPI: Cassandra Benson is a 79 y.o. female presenting on 08/04/2020 for Hypertension (Here for 3 mo f/u.  States Dr. Rockey Situ just increased losartan to 50 mg daily and Crestor to 20 mg daily.  Pt has not started either one yet. )   Planning upcoming trip to [place] for 11 days for Thanksgiving.   See prior note for details.  Seen here 07/08/2020 after episode of full dark vision associated with cloudy vision, confusion and imbalance for several hours as well as constant pain at vertex of head with radiation to R temple. At that time, concern for amaurosis fugax s/p unrevealing workup including head CT (chronic atrophic changes), echocardiogram (EF 55-60%, G2DD), and carotid US (RICA clear, LICA stent without stenosis, L subclavian artery flow disturbed).   Saw ER for ongoing headache 07/16/2020 - dx with migraine treated with IV reglan with benefit.   Consider neurology referral vs intracranial vascular evaluation to further evaluate for vertebrobasilar ischemia.   No migraine history.   She continues aspirin 61m and crestor 155mdaily.   Upcoming eye appointment 09/2020.  Saw Dr GoRockey Situoday - losartan and crestor doses were increased.   Stays worried about low stamina.  10 lb weight gain noted.  Doesn't think she's changed her eating habits.  DM - ongoing struggles with insulin pump.   Known carotid stenosis s/p L transcarotid revascularization 06/2018 (Dr ClCarlis Abbott Known Sjogren's syndrome.   Echocardiogram 07/2020: EF 55-60%, G2DD, abnormal global longitudinal strain, normal valves and R heart function  Upcoming  eye appointment 09/2020.      Relevant past medical, surgical, family and social history reviewed and updated as indicated. Interim medical history since our last visit reviewed. Allergies and medications reviewed and updated. Outpatient Medications Prior to Visit  Medication Sig Dispense Refill  . acetaminophen (TYLENOL) 500 MG tablet Take 1 tablet (500 mg total) by mouth 3 (three) times daily as needed.    . Ascorbic Acid (VITAMIN C) 100 MG tablet Take 100 mg by mouth daily.    . Marland Kitchenspirin EC 81 MG tablet Take 81 mg by mouth daily.    . BD ULTRA-FINE LANCETS lancets Use as instructed upto 6 times daily 600 each 2  . benzonatate (TESSALON) 100 MG capsule Take 1 capsule (100 mg total) by mouth 3 (three) times daily as needed for cough. 30 capsule 3  . clopidogrel (PLAVIX) 75 MG tablet Take 1 tablet (75 mg total) by mouth daily. 30 tablet 6  . Continuous Blood Gluc Transmit (DEXCOM G6 TRANSMITTER) MISC 1 kit by Does not apply route See admin instructions. Use to check blood sugar 1 each 1  . diclofenac Sodium (VOLTAREN) 1 % GEL Apply 2 g topically 3 (three) times daily. 100 g 3  . ezetimibe (ZETIA) 10 MG tablet Take 1 tablet (10 mg total) by mouth daily. 90 tablet 3  . glucagon 1 MG injection Inject 1 mg into the muscle once as needed for up to 1 dose. 1 each  12  . glucose blood (CONTOUR NEXT TEST) test strip Use as instructed to check 4 times daily 400 each 5  . insulin aspart (NOVOLOG) 100 UNIT/ML injection Sliding scale-no more than 50 units per day    . Insulin Pen Needle 32G X 4 MM MISC Use 4-5x a day 300 each 3  . losartan (COZAAR) 25 MG tablet Take 1 tablet (25 mg total) by mouth daily. 30 tablet 11  . memantine (NAMENDA) 5 MG tablet Take 1 tablet (5 mg total) by mouth 2 (two) times daily. 60 tablet 3  . metoprolol succinate (TOPROL-XL) 50 MG 24 hr tablet Take 50 mg by mouth at bedtime. Take with or immediately following a meal.    . nitroGLYCERIN (NITROSTAT) 0.4 MG SL tablet Place 1 tablet  (0.4 mg total) under the tongue every 5 (five) minutes as needed for chest pain. 30 tablet 1  . pregabalin (LYRICA) 25 MG capsule Take 1 capsule (25 mg total) by mouth daily. (Patient not taking: Reported on 08/04/2020)    . rosuvastatin (CRESTOR) 10 MG tablet TAKE 1 TABLET(10 MG) BY MOUTH DAILY 30 tablet 5  . SYNTHROID 75 MCG tablet Take 1 tablet (75 mcg total) by mouth daily before breakfast. Don't take any on Sundays     No facility-administered medications prior to visit.     Per HPI unless specifically indicated in ROS section below Review of Systems Objective:  BP 140/68 (BP Location: Left Arm, Patient Position: Sitting, Cuff Size: Normal)   Pulse 98   Temp 97.7 F (36.5 C) (Temporal)   Ht 5' 3.5" (1.613 m)   Wt 161 lb 7 oz (73.2 kg)   SpO2 96%   BMI 28.15 kg/m   Wt Readings from Last 3 Encounters:  08/04/20 161 lb 7 oz (73.2 kg)  08/04/20 160 lb (72.6 kg)  07/16/20 150 lb (68 kg)      Physical Exam Vitals and nursing note reviewed.  Constitutional:      Appearance: Normal appearance. She is not ill-appearing.  Neck:     Vascular: No carotid bruit.  Cardiovascular:     Rate and Rhythm: Normal rate and regular rhythm.     Pulses: Normal pulses.     Heart sounds: Normal heart sounds. No murmur heard.   Pulmonary:     Effort: Pulmonary effort is normal. No respiratory distress.     Breath sounds: Normal breath sounds. No wheezing, rhonchi or rales.  Musculoskeletal:     Right lower leg: No edema.     Left lower leg: No edema.  Neurological:     Mental Status: She is alert.  Psychiatric:        Mood and Affect: Mood normal.        Behavior: Behavior normal.       Lab Results  Component Value Date   CREATININE 0.95 07/16/2020   BUN 13 07/16/2020   NA 138 07/16/2020   K 4.4 07/16/2020   CL 102 07/16/2020   CO2 24 07/16/2020    Lab Results  Component Value Date   CHOL 183 07/08/2020   HDL 53.50 07/08/2020   LDLCALC 95 07/08/2020   LDLDIRECT 177.0  08/09/2016   TRIG 175.0 (H) 07/08/2020   CHOLHDL 3 07/08/2020   Assessment & Plan:  This visit occurred during the SARS-CoV-2 public health emergency.  Safety protocols were in place, including screening questions prior to the visit, additional usage of staff PPE, and extensive cleaning of exam room while observing appropriate contact  time as indicated for disinfecting solutions.   Problem List Items Addressed This Visit    Sjogren's syndrome without extraglandular involvement ([place])   Monckeberg's medial sclerosis   LADA (latent autoimmune diabetes in adults), managed as type 1 ([place])    Remains in poor control. Considering switching to older insulin pump. Has endo f/u planned next for month.       Intractable episodic headache - Primary    ?migraine related - had negative evaluation for temporal arteritis 2019 (ESR normal, temporal artery biopsy normal). Will refer to neuro for further evaluation of ongoing headaches ?migraines.       Relevant Orders   Ambulatory referral to Neurology   Hyperlipidemia associated with type 2 diabetes mellitus ([place])    Saw cards today, pt states crestor increased to 45m daily however reviewing Dr GDonivan Scullnote, crestor continued at 178m zetia 1049mdded to regimen.       Fibromyalgia    Continue lower lyrica 64m63mce daily dose. Pt noted weight gain on higher dose.       Essential hypertension    Noted recent losartan dose increase. This will help noted G2DD on recent echo.      Amaurosis fugax, both eyes    No further episodes since last month. Reassuring workup to date (echocardiogram, carotid US, [place] CT. ?migraine related vision changes given associated headache. She did improve with reglan at ER. Will refer to neurology for further evaluation (she has previously seen Dr PottMelrose Nakayama     Relevant Orders   Ambulatory referral to Neurology       No orders of the defined types were placed in this encounter.  Orders Placed This Encounter    Procedures  . Ambulatory referral to Neurology    Referral Priority:   Routine    Referral Type:   Consultation    Referral Reason:   Specialty Services Required    Requested Specialty:   Neurology    Number of Visits Requested:   1    Patient Instructions  Ensure good water intake.  We will refer you back to Dr PottMelrose Nakayamarology for evaluation of ongoing headache. Start monitoring blood pressures closely  Keep appointment for next month.    Follow up plan: Return if symptoms worsen or fail to improve.  JaviRia Bush

## 2020-08-04 NOTE — Patient Instructions (Addendum)
Medication Instructions:  Please start zetia 10 mg daily  Please monitor blood pressures and keep a log of your readings.   Make sure to check 2 hours after your medications.   AVOID these things for 30 minutes before checking your blood pressure:  Drinking caffeine.  Drinking alcohol.  Eating.  Smoking.  Exercising.  Five minutes before checking your blood pressure:  Pee.  Sit in a dining chair. Avoid sitting in a soft couch or armchair.  Be quiet. Do not talk.  Send [place] some blood pressure readings using My Chart.  If you need a refill on your cardiac medications before your next appointment, please call your pharmacy.    Lab work: No new labs needed   If you have labs (blood work) drawn today and your tests are completely normal, you will receive your results only by: Marland Kitchen MyChart Message (if you have MyChart) OR . A paper copy in the mail If you have any lab test that is abnormal or we need to change your treatment, we will call you to review the results.   Testing/Procedures: No new testing needed   Follow-Up: At [facility], you and your health needs are our priority.  As part of our continuing mission to provide you with exceptional heart care, we have created designated Provider Care Teams.  These Care Teams include your primary Cardiologist (physician) and Advanced Practice Providers (APPs -  Physician Assistants and Nurse Practitioners) who all work together to provide you with the care you need, when you need it.  . You will need a follow up appointment in 6 months  . Providers on your designated Care Team:   . Murray Hodgkins, NP . Christell Faith, PA-C . Marrianne Mood, PA-C  Any Other Special Instructions Will Be Listed Below (If Applicable).  COVID-19 Vaccine Information can be found at: [email or web address].co.uk For questions related to vaccine distribution or appointments, please email  [email or web address] or call [phone].

## 2020-08-04 NOTE — Assessment & Plan Note (Signed)
Remains in poor control. Considering switching to older insulin pump. Has endo f/u planned next for month.

## 2020-08-04 NOTE — Assessment & Plan Note (Signed)
Noted recent losartan dose increase. This will help noted G2DD on recent echo.

## 2020-08-05 ENCOUNTER — Telehealth: Payer: Self-pay | Admitting: Family Medicine

## 2020-08-05 NOTE — Telephone Encounter (Signed)
Seen this week  

## 2020-08-05 NOTE — Telephone Encounter (Signed)
Seen at [facility] yesterday.

## 2020-08-05 NOTE — Progress Notes (Signed)
  Chronic Care Management   Outreach Note  08/05/2020 Name: Cassandra Benson MRN: [id number] DOB: [birth year]  Referred by: [name], MD Reason for referral : Chronic Care Management   An unsuccessful telephone outreach was attempted today. The patient was referred to the pharmacist for assistance with care management and care coordination.   Follow Up Plan:   Hilario Quarry  Upstream Scheduler

## 2020-08-09 ENCOUNTER — Encounter: Payer: Self-pay | Admitting: Family Medicine

## 2020-08-12 ENCOUNTER — Encounter: Payer: Self-pay | Admitting: Vascular Surgery

## 2020-08-12 ENCOUNTER — Other Ambulatory Visit: Payer: Self-pay

## 2020-08-12 ENCOUNTER — Ambulatory Visit: Payer: PPO | Admitting: Vascular Surgery

## 2020-08-12 VITALS — BP 152/89 | HR 99 | Temp 97.7°F | Resp 18 | Ht 63.5 in | Wt 158.9 lb

## 2020-08-12 DIAGNOSIS — I83813 Varicose veins of bilateral lower extremities with pain: Secondary | ICD-10-CM | POA: Diagnosis not present

## 2020-08-12 DIAGNOSIS — R519 Headache, unspecified: Secondary | ICD-10-CM | POA: Diagnosis not present

## 2020-08-12 DIAGNOSIS — H547 Unspecified visual loss: Secondary | ICD-10-CM | POA: Diagnosis not present

## 2020-08-12 DIAGNOSIS — R202 Paresthesia of skin: Secondary | ICD-10-CM | POA: Diagnosis not present

## 2020-08-12 NOTE — Progress Notes (Signed)
Patient is a 79 year old female who returns for follow-up today.  She was recently seen by my partner Dr. Carlis Abbott for bilateral varicose veins.  Her right leg is worse than her left.  She has heaviness fullness and aching that develops in both of her lower extremities as the day progresses.  This is usually better by morning time.  She has been compliant wearing thigh-high compression stockings.  She states that these intermittently help.  She has no prior history of DVT.  She does have a prior history of carotid disease and had TCAR stenting by Dr. Carlis Abbott in the past.  He does not have any claudication symptoms.  Review of systems: She has no shortness of breath.  She has no chest pain.  Physical exam:  Vitals:   08/12/20 1518  BP: (!) 152/89  Pulse: 99  Resp: 18  Temp: 97.7 F (36.5 C)  TempSrc: Temporal  SpO2: 98%  Weight: 158 lb 14.4 oz (72.1 kg)  Height: 5' 3.5" (1.613 m)    Extremities: 2+ dorsalis pedis pulses bilaterally.  Scattered spider type varicosities on the thigh and calf.  Data: Patient had a venous reflux exam performed July 2021.  I reviewed and interpreted the study today.  Right greater saphenous vein had diffuse reflux 5 mm diameter.  Left greater saphenous also had diffuse reflux 4 to 5 mm diameter.  I repeated portions of her venous reflux exam today with the SonoSite at the bedside.  This confirmed the above findings.  On my exam vein diameter was about 4 to 4-1/2 mm.  Assessment: Symptomatic varicose veins with pain.  Her symptoms have been improved somewhat with compression stockings.  We discussed today the possibility of laser ablation.  She wishes to defer this for now.  She may consider it again in the future.  She will intermittently see [place] for follow-up with her her carotid.  If she wishes to pursue an intervention for her veins in the future we could revisit this.  Ruta Hinds, MD [facility] Office: [phone]

## 2020-08-15 ENCOUNTER — Encounter: Payer: Self-pay | Admitting: Family Medicine

## 2020-09-01 ENCOUNTER — Encounter: Payer: Self-pay | Admitting: Family Medicine

## 2020-09-01 ENCOUNTER — Ambulatory Visit (INDEPENDENT_AMBULATORY_CARE_PROVIDER_SITE_OTHER)
Admission: RE | Admit: 2020-09-01 | Discharge: 2020-09-01 | Disposition: A | Discharge status: Home / Self Care | Payer: PPO | Source: Ambulatory Visit | Attending: Family Medicine | Admitting: Family Medicine

## 2020-09-01 ENCOUNTER — Ambulatory Visit (INDEPENDENT_AMBULATORY_CARE_PROVIDER_SITE_OTHER): Payer: PPO | Admitting: Family Medicine

## 2020-09-01 ENCOUNTER — Other Ambulatory Visit: Payer: Self-pay

## 2020-09-01 VITALS — BP 136/68 | HR 87 | Temp 97.6°F | Ht 63.5 in | Wt 157.5 lb

## 2020-09-01 DIAGNOSIS — W19XXXA Unspecified fall, initial encounter: Secondary | ICD-10-CM

## 2020-09-01 DIAGNOSIS — Z96641 Presence of right artificial hip joint: Secondary | ICD-10-CM | POA: Diagnosis not present

## 2020-09-01 DIAGNOSIS — M25551 Pain in right hip: Secondary | ICD-10-CM

## 2020-09-01 DIAGNOSIS — Z471 Aftercare following joint replacement surgery: Secondary | ICD-10-CM | POA: Diagnosis not present

## 2020-09-01 DIAGNOSIS — M25521 Pain in right elbow: Secondary | ICD-10-CM

## 2020-09-01 NOTE — Progress Notes (Signed)
Patient ID: Cassandra Benson, female    DOB: [birth year], 79 y.o.   MRN: [id number]  This visit was conducted in person.  BP 136/68 (BP Location: Left Arm, Patient Position: Sitting, Cuff Size: Normal)   Pulse 87   Temp 97.6 F (36.4 C) (Temporal)   Ht 5' 3.5" (1.613 m)   Wt 157 lb 8 oz (71.4 kg)   SpO2 98%   BMI 27.46 kg/m    CC: fall  Subjective:   HPI: Cassandra Benson is a 79 y.o. female presenting on 09/01/2020 for Fall (Pt fell on 08/25/20 while visiting in [place].  C/o pain in right shoulder and right elbow. )   DOI: 08/15/2020   Fall while at [facility] visiting family at grocery store parking lot while stepping off curb, R hip and leg gave out, landed on right side, landed on R lateral hip, R elbow and shoulder. No dizziness, presyncope or LOC. Some residual pain at R shoulder/elbow.  Did not seek care for this.  Treated with heating pad, tylenol.   H/o R hip replacement.      Relevant past medical, surgical, family and social history reviewed and updated as indicated. Interim medical history since our last visit reviewed. Allergies and medications reviewed and updated. Outpatient Medications Prior to Visit  Medication Sig Dispense Refill  . acetaminophen (TYLENOL) 500 MG tablet Take 1 tablet (500 mg total) by mouth 3 (three) times daily as needed.    . Ascorbic Acid (VITAMIN C) 100 MG tablet Take 100 mg by mouth daily.    Marland Kitchen aspirin EC 81 MG tablet Take 81 mg by mouth daily.    . BD ULTRA-FINE LANCETS lancets Use as instructed upto 6 times daily 600 each 2  . benzonatate (TESSALON) 100 MG capsule Take 1 capsule (100 mg total) by mouth 3 (three) times daily as needed for cough. 30 capsule 3  . clopidogrel (PLAVIX) 75 MG tablet Take 1 tablet (75 mg total) by mouth daily. 30 tablet 6  . Continuous Blood Gluc Transmit (DEXCOM G6 TRANSMITTER) MISC 1 kit by Does not apply route See admin instructions. Use to check blood sugar 1 each 1  . diclofenac Sodium (VOLTAREN) 1 % GEL  Apply 2 g topically 3 (three) times daily. 100 g 3  . ezetimibe (ZETIA) 10 MG tablet Take 1 tablet (10 mg total) by mouth daily. 90 tablet 3  . glucagon 1 MG injection Inject 1 mg into the muscle once as needed for up to 1 dose. 1 each 12  . glucose blood (CONTOUR NEXT TEST) test strip Use as instructed to check 4 times daily 400 each 5  . insulin aspart (NOVOLOG) 100 UNIT/ML injection Sliding scale-no more than 50 units per day    . Insulin Pen Needle 32G X 4 MM MISC Use 4-5x a day 300 each 3  . losartan (COZAAR) 25 MG tablet Take 1 tablet (25 mg total) by mouth daily. (Patient taking differently: Take 25 mg by mouth in the morning and at bedtime. ) 30 tablet 11  . memantine (NAMENDA) 5 MG tablet Take 1 tablet (5 mg total) by mouth 2 (two) times daily. 60 tablet 3  . metoprolol succinate (TOPROL-XL) 50 MG 24 hr tablet Take 50 mg by mouth at bedtime. Take with or immediately following a meal.    . nitroGLYCERIN (NITROSTAT) 0.4 MG SL tablet Place 1 tablet (0.4 mg total) under the tongue every 5 (five) minutes as needed for chest pain. [place]  tablet 1  . pregabalin (LYRICA) 25 MG capsule Take 25 mg by mouth daily.     . rosuvastatin (CRESTOR) 10 MG tablet TAKE 1 TABLET(10 MG) BY MOUTH DAILY 30 tablet 5  . SYNTHROID 75 MCG tablet Take 1 tablet (75 mcg total) by mouth daily before breakfast. Don't take any on Sundays     No facility-administered medications prior to visit.     Per HPI unless specifically indicated in ROS section below Review of Systems Objective:  BP 136/68 (BP Location: Left Arm, Patient Position: Sitting, Cuff Size: Normal)   Pulse 87   Temp 97.6 F (36.4 C) (Temporal)   Ht 5' 3.5" (1.613 m)   Wt 157 lb 8 oz (71.4 kg)   SpO2 98%   BMI 27.46 kg/m   Wt Readings from Last 3 Encounters:  09/01/20 157 lb 8 oz (71.4 kg)  08/12/20 158 lb 14.4 oz (72.1 kg)  08/04/20 161 lb 7 oz (73.2 kg)      Physical Exam Vitals and nursing note reviewed.  Constitutional:      Appearance:  Normal appearance. She is not ill-appearing.  Cardiovascular:     Rate and Rhythm: Normal rate and regular rhythm.     Pulses: Normal pulses.     Heart sounds: Normal heart sounds. No murmur heard.   Pulmonary:     Effort: Pulmonary effort is normal. No respiratory distress.     Breath sounds: Normal breath sounds. No wheezing, rhonchi or rales.  Musculoskeletal:        General: Tenderness present.     Right lower leg: No edema.     Left lower leg: No edema.     Comments:  L shoulder WNL R shoulder - FROM abduction and forward flexion, tender to palpation at biceps tendon anteriorly, pain with Speed test, discomfort with RTC testing (external rotation against resistance), discomfort without significant pain with empty can sign.  R elbow FROM flexion/extension, point tender at olecranon bursa without bursal inflammation, tender medial and lateral epicondyles.  R hip - FROM int/ext rotation, reproducible tenderness lateral hip at trochanteric bursa into distal lateral thigh   Skin:    General: Skin is warm and dry.     Findings: No bruising or rash.  Neurological:     Mental Status: She is alert.  Psychiatric:        Mood and Affect: Mood normal.        Behavior: Behavior normal.       Assessment & Plan:  This visit occurred during the SARS-CoV-2 public health emergency.  Safety protocols were in place, including screening questions prior to the visit, additional usage of staff PPE, and extensive cleaning of exam room while observing appropriate contact time as indicated for disinfecting solutions.   Problem List Items Addressed This Visit    Cause of injury, fall - Primary    Anticipate bony contusion to R elbow as well as R biceps tendonitis. Anticipate R hip bursitis all residual from fall. Doubt fracture. Supportive care reviewed with heating pad, topical remedies, tylenol for discomfort, tramadol for breakthrough pain. Anticipate continued daily improvement, update if not the  case.  Check films today r/o fracture.       Relevant Orders   DG Elbow Complete Right   DG Hip Unilat W OR W/O Pelvis 2-3 Views Right    Other Visit Diagnoses    Right elbow pain       Lateral pain of right hip  No orders of the defined types were placed in this encounter.  Orders Placed This Encounter  Procedures  . DG Elbow Complete Right    Standing Status:   Future    Number of Occurrences:   1    Standing Expiration Date:   09/01/2021    Order Specific Question:   Reason for Exam (SYMPTOM  OR DIAGNOSIS REQUIRED)    Answer:   R elbow pain after fall 2 wks ago    Order Specific Question:   Preferred imaging location?    Answer:   [facility]  . DG Hip Unilat W OR W/O Pelvis 2-3 Views Right    Standing Status:   Future    Number of Occurrences:   1    Standing Expiration Date:   09/01/2021    Order Specific Question:   Reason for Exam (SYMPTOM  OR DIAGNOSIS REQUIRED)    Answer:   R lateral hip pain after fall 2 wks ago    Order Specific Question:   Preferred imaging location?    Answer:   [facility]    Patient Instructions  I think you have hip bursitis, elbow bony bruise, and shoulder tendonitis at biceps all on the right.  Right elbow and hip xrays today.  May continue heating pad, tylenol, topical remedies. Use tramadol for breakthrough pain.  This should continue to improve each day.    Follow up plan: Return if symptoms worsen or fail to improve.  [name], MD

## 2020-09-01 NOTE — Patient Instructions (Addendum)
I think you have hip bursitis, elbow bony bruise, and shoulder tendonitis at biceps all on the right.  Right elbow and hip xrays today.  May continue heating pad, tylenol, topical remedies. Use tramadol for breakthrough pain.  This should continue to improve each day.

## 2020-09-01 NOTE — Assessment & Plan Note (Addendum)
Anticipate bony contusion to R elbow as well as R biceps tendonitis. Anticipate R hip bursitis all residual from fall. Doubt fracture. Supportive care reviewed with heating pad, topical remedies, tylenol for discomfort, tramadol for breakthrough pain. Anticipate continued daily improvement, update if not the case.  Check films today r/o fracture.

## 2020-09-04 ENCOUNTER — Ambulatory Visit: Payer: PPO | Admitting: Internal Medicine

## 2020-09-04 ENCOUNTER — Other Ambulatory Visit: Payer: Self-pay

## 2020-09-04 ENCOUNTER — Encounter: Payer: Self-pay | Admitting: Internal Medicine

## 2020-09-04 VITALS — BP 110/70 | HR 80 | Ht 63.0 in | Wt 158.4 lb

## 2020-09-04 DIAGNOSIS — E063 Autoimmune thyroiditis: Secondary | ICD-10-CM

## 2020-09-04 DIAGNOSIS — E139 Other specified diabetes mellitus without complications: Secondary | ICD-10-CM

## 2020-09-04 DIAGNOSIS — E038 Other specified hypothyroidism: Secondary | ICD-10-CM

## 2020-09-04 DIAGNOSIS — E782 Mixed hyperlipidemia: Secondary | ICD-10-CM

## 2020-09-04 LAB — POCT GLYCOSYLATED HEMOGLOBIN (HGB A1C): Hemoglobin A1C: 12 % — AB (ref 4.0–5.6)

## 2020-09-04 MED ORDER — TRESIBA FLEXTOUCH 100 UNIT/ML ~~LOC~~ SOPN: 20.0000 [IU] | PEN_INJECTOR | Freq: Every day | SUBCUTANEOUS | 3 refills | Status: DC

## 2020-09-04 NOTE — Addendum Note (Signed)
Addended by: Lauralyn Primes on: 09/04/2020 02:58 PM   Modules accepted: Orders

## 2020-09-04 NOTE — Patient Instructions (Addendum)
Please restart: - Tresiba 20 units in am (if sugars remain high after starting this, please increase to 24 units)  Continue: - Novolog: ICR: 1:15 Target: 130 Insulin sensitivity factor: 20 130-150: +1 unit 151-170: +2 units 171-190: +3 units 191-210: +4 units 211-230: +5 units 231-250: +6 units >250: +7 units If you correct sugars at bedtime, only correct >200 and only give 2 to 3 units.  Please contact the [employer] rep, Deberah Castle.  Please return in 1.5 months with your sugar log.

## 2020-09-04 NOTE — Progress Notes (Signed)
Patient ID: Cassandra Benson, female   DOB: 1941-01-21, 79 y.o.   MRN: [id number]   This visit occurred during the SARS-CoV-2 public health emergency.  Safety protocols were in place, including screening questions prior to the visit, additional usage of staff PPE, and extensive cleaning of exam room while observing appropriate contact time as indicated for disinfecting solutions.   HPI: Cassandra Benson is a 79 y.o.-year-old female, returning for f/u for LADA, initially dx'ed in 1998 (79 y/o), started insulin at dx, started insulin pump in ~2008, uncontrolled, without complications and also Hashimoto's hypothyroidism. She previously saw endocrinology at [facility] (Dr. Janese Banks) and Dr Howell Rucks. Last visit with me 3.5 months ago.  At last visit, sugars are higher as she had significant amount of pain all over her body due to her rheumatologic autoimmune diseases.  Her Lyrica was increased to 25 mg twice a day for back pain and she noticed weight gain on this.  At this visit, she tells me that she came off the insulin pump since last visit.  Unfortunately, she did not contact me about this and she is currently only on NovoLog, without long-acting insulin!  Sugars are extremely high.  Reviewed HbA1c levels: Lab Results  Component Value Date   HGBA1C 9.3 (A) 05/25/2020   HGBA1C 8.4 (H) 01/15/2020   HGBA1C 9.8 (A) 10/08/2019   She is back on: - > off since last visit!!! - Novolog: ICR: 1:8 (starches)-1:10 (other foods), but mostly using carb equivalents >> actually using 1:15g Target: 130 >> actually using 140 Insulin sensitivity factor: 20 If you correct sugars at bedtime, only correct >200 and only give 2 to 3 units. She was previously using Glucophage ER d.a.w. but had to stop because this was not covered by her insurance.  She tried the generic metformin ER and this caused diarrhea so she had to stop.  Insulin pump:  -Previously: Medtronic 723-started 09/2016 (changed 07/2017), without CGM.   She was  using Medtronic for supplies before, but she is then forced by insurance to use [employer].  She came off the pump in the past as she had a lot of problems getting supplies from them. -then T:slim x2 - started 2020 - stopped since last OV  CGM: -Dexcom G6 >> off now  Prev. Pump settings: - basal rates: 12 am: 0.800 units/h >> 0.950 3 am: 0.900 >> 1.000 9:30 am: 0.650 >> 0.750 5 pm: 1.300 6:30 pm: 1.200 11:30 pm: 1.200 - ICR: 1:8 - target: 110 - ISF:  12 am: 20 8 pm: 30 - Active insulin time: 4h TDD from basal insulin: 52% (18 units) >> 55% (18 units) TDD from bolus insulin: 48% (17 units) >> 45% (15 units) TDD: 35 to 50 units >> 35-50 >> up to 50 units  - extended bolusing: not using - changes infusion site: q3 days  She checks her sugars 1x a day: - am: 157, 171-264 >> 103, 247-378, 416 - 2h after b'fast: n/c - lunch: 188-239 >> 345 - 2h after lunch: 292 >> n/c - dinner: 81, 98-180 >> 254, 260 - 2h after dinner: 128, 270 >> n/c - bedtime: 255 >> n/c  Lowest sugar was 40 x 1 at night >> 103 x1; she has hypoglycemia awareness in the 80s.  No history of hypoglycemia admissions.  She has a glucagon kit at home. Highest sugar was 500s (dehydration, afterwards: 339) >> 271 >> 416; no history of DKA admissions:  Pt's meals are: - Breakfast: protein drink + almond  milk - Lunch: PB jelly sandwich or sandwich with ham or cream cheese sandwich - Dinner: salads  - Snacks: pretzels; pork tenderloin + veggies; chicken + tenderloin No sodas.  She is walking 3 times a day for 30 minutes.  No history of CKD, last BUN/creatinine:  Lab Results  Component Value Date   BUN 13 07/16/2020   BUN 10 03/05/2020   CREATININE 0.95 07/16/2020   CREATININE 0.93 03/05/2020  On lisinopril. + HL;  last set of lipids: Lab Results  Component Value Date   CHOL 183 07/08/2020   HDL 53.50 07/08/2020   LDLCALC 95 07/08/2020   LDLDIRECT 177.0 08/09/2016   TRIG 175.0 (H) 07/08/2020   CHOLHDL 3  07/08/2020  On Crestor 10-continues to have muscle cramps despite using CoQ10.  She also takes [employer].  Praluent was not covered. - last eye exam was in 05/2019: No DR. she saw an ophthalmologist in [place] and would like to establishing care and [place]. - No numbness and tingling in her feet.  She was admitted for CP + SOB 10/01/2017.  Cardiac events and PE were ruled out.  She is seeing cardiology (Dr. Rockey Situ). She had right carotid ultrasound in 2020 and there is a 39% stenosis, significantly increased since 2018.  She has a history of left carotid endarterectomy. She was started on compression hoses by [facility].  These helped. 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  Hypothyroidism: -Due to Hashimoto's thyroiditis -Positive family history of Graves' disease in her mother -She was initially on Levoxyl, but she thought Levoxyl increased her lipid levels and caused hair loss, so we changed to Synthroid.  She feels much better on thIS.  She is currently on Synthroid 75 mcg 6 out of 7 days: - in am - fasting - at least 30 min from b'fast - no calcium - no iron - + multivitamins at night - no PPIs - not on Biotin   Reviewed her TFTs: Lab Results  Component Value Date   TSH 1.73 11/27/2019   TSH 0.12 (L) 10/07/2019   TSH 0.22 (L) 09/10/2019   TSH 1.10 11/06/2018   TSH 0.09 (L) 08/29/2018   TSH 0.92 11/27/2017   TSH 0.30 (L) 10/11/2017   TSH 0.24 (L) 08/11/2017   TSH 0.58 01/04/2017   TSH 0.42 08/09/2016   She also has a history of SLE and Sjogren's syndrome.  She has generalized muscle aches and joint aches from these 2 conditions.  We checked her hormone levels in the setting of hirsutism and and they were normal: Component     Latest Ref Rng & Units 09/02/2014 09/08/2014  Testosterone     3 - 41 ng/dL 59 (H)   Testosterone Free     0.0 - 4.2 pg/mL 2.1   FSH     mIU/mL  68.6  LH     mIU/mL   43.4  DHEA-SO4     7 - 177 ug/dL 49   Cortisol, Plasma     ug/dL  1.3 (Normal Dexamethasone suppression test)   Estradiol     pg/mL  24.0   No evidence of adrenal insufficiency: Component     Latest Ref Rng & Units 01/04/2017  Cortisol - AM     mcg/dL 17.6  C206 ACTH     6 - 50 pg/mL 15   She was admitted 01/15/2020 with SBO.  Before last visit she was diagnosed with mild cognitive impairment  and was started on Namenda.  ROS: Constitutional: no weight gain/no weight loss, + fatigue, no subjective hyperthermia, no subjective hypothermia Eyes: no blurry vision, no xerophthalmia ENT: no sore throat, + see HPI Cardiovascular: no CP/no SOB/no palpitations/no leg swelling Respiratory: no cough/no SOB/no wheezing Gastrointestinal: no N/no V/no D/no C/no acid reflux Musculoskeletal: + muscle aches/no joint aches Skin: no rashes, no hair loss Neurological: no tremors/no numbness/no tingling/no dizziness  I reviewed pt's medications, allergies, PMH, social hx, family hx, and changes were documented in the history of present illness. Otherwise, unchanged from my initial visit note.  Past Medical History:  Diagnosis Date  . Allergy   . ANA positive    positive ANA pattern 1 speckled  . Arthritis   . Carotid stenosis, asymptomatic 06/19/2015   3-54% RICA 56-25% LICA rpt 1 yr (02/3892)   . Colon polyps   . Dermatomyositis ([place])   . Diabetes mellitus without complication (HCC)    Type 1  . Diverticulosis    sigmoid on CT scan 12/2019  . Family history of adverse reaction to anesthesia    brothr went into cardiac arrest from anectine  . Fibromyalgia    prior PCP  . GERD (gastroesophageal reflux disease)    prior PCP  . Glaucoma    Narrow angle  . History of blood clots    DVT, in 20s, none since  . History of chicken pox   . History of diverticulitis   . History of pericarditis 1986   with hospitalization  . History of pneumonia 2014  . History of shingles   . History of UTI    . Hyperlipidemia   . Hypertension   . Hypothyroidism   . Mixed connective tissue disease ([place])   . Partial small bowel obstruction ([place]) 12/2019   managed conservatively  . Peptic ulcer   . Pneumonia   . PONV (postoperative nausea and vomiting)   . Raynaud's disease without gangrene   . Shoulder pain left   h/o RTC tendonitis and adhesive capsulitis  . Sjogren's syndrome ([place])   . Sleep apnea    prior PCP - no CPAP for about 10 yrs  . 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[place])   . Vitamin D deficiency    prior PCP   Past Surgical History:  Procedure Laterality Date  . ABDOMINAL HYSTERECTOMY  1978   fibroids and menorrhagia, ovaries remain  . ARTERY BIOPSY Right 04/06/2018   Procedure: BIOPSY TEMPORAL ARTERY RIGHT;  Surgeon: Beverly Gust, MD;  Location: [facility];  Service: ENT;  Laterality: Right;  Diabetic - insulin pump sleep apnea  . [facility] normal per patient  . COLONOSCOPY  10/2011   1 TA, 1 HP, very tortuous colon ([place])  . COLONOSCOPY WITH ESOPHAGOGASTRODUODENOSCOPY (EGD)  03/2007   2 ulcers, benign polyp, rpt 5 yrs ([place])  . JOINT REPLACEMENT Right    hip  . PARTIAL HIP ARTHROPLASTY  2013   Right hip replacement  . TONSILLECTOMY    . TONSILLECTOMY AND ADENOIDECTOMY    . TRANSCAROTID ARTERY REVASCULARIZATION Left 07/23/2018   Procedure: TRANSCAROTID ARTERY REVASCULARIZATION;  Surgeon: Marty Heck, MD;  Location: [facility];  Service: Vascular;  Laterality: Left;  . TUBAL LIGATION    . VAGINAL DELIVERY     x2, no complications   Social History   Social History  . Widowed   . Number of children: 2   Occupational History  Retired    Social History  Main Topics  . Smoking status: Never Smoker  . Smokeless tobacco: Never Used  . Alcohol use No  . Drug use: No   Social History Narrative   Lives in [place] now. Recently moved from [place].   No pets.   Mother of Maanasa Aderhold.   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 acetaminophen (TYLENOL) 500 MG tablet Take 1 tablet (500 mg total) by mouth 3 (three) times daily as needed.    . Ascorbic Acid (VITAMIN C) 100 MG tablet Take 100 mg by mouth daily.    Marland Kitchen aspirin EC 81 MG tablet Take 81 mg by mouth daily.    . BD ULTRA-FINE LANCETS lancets Use as instructed upto 6 times daily 600 each 2  . benzonatate (TESSALON) 100 MG capsule Take 1 capsule (100 mg total) by mouth 3 (three) times daily as needed for cough. 30 capsule 3  . clopidogrel (PLAVIX) 75 MG tablet Take 1 tablet (75 mg total) by mouth daily. 30 tablet 6  . Continuous Blood Gluc Transmit (DEXCOM G6 TRANSMITTER) MISC 1 kit by Does not apply route See admin instructions. Use to check blood sugar 1 each 1  . diclofenac Sodium (VOLTAREN) 1 % GEL Apply 2 g topically 3 (three) times daily. 100 g 3  . ezetimibe (ZETIA) 10 MG tablet Take 1 tablet (10 mg total) by mouth daily. 90 tablet 3  . glucagon 1 MG injection Inject 1 mg into the muscle once as needed for up to 1 dose. 1 each 12  . glucose blood (CONTOUR NEXT TEST) test strip Use as instructed to check 4 times daily 400 each 5  . insulin aspart (NOVOLOG) 100 UNIT/ML injection Sliding scale-no more than 50 units per day    . Insulin Pen Needle 32G X 4 MM MISC Use 4-5x a day 300 each 3  . losartan (COZAAR) 25 MG tablet Take 1 tablet (25 mg total) by mouth daily. (Patient taking differently: Take 25 mg by mouth in the morning and at bedtime. ) 30 tablet 11  . memantine (NAMENDA) 5 MG tablet Take 1 tablet (5 mg total) by mouth 2 (two) times daily. 60 tablet 3  . metoprolol succinate (TOPROL-XL) 50 MG 24 hr tablet Take 50 mg by mouth at bedtime.  Take with or immediately following a meal.    . nitroGLYCERIN (NITROSTAT) 0.4 MG SL tablet Place 1 tablet (0.4 mg total) under the tongue every 5 (five) minutes as needed for chest pain. 30 tablet 1  . pregabalin (LYRICA) 25 MG capsule Take 25 mg by mouth daily.     . rosuvastatin (CRESTOR) 10 MG tablet TAKE 1 TABLET(10 MG) BY MOUTH DAILY 30 tablet 5  . SYNTHROID 75 MCG tablet Take 1 tablet (75 mcg total) by mouth daily before breakfast. Don't take any on Sundays     No current facility-administered medications on file prior to visit.   Allergies  Allergen Reactions  . Iodinated Diagnostic Agents Other (See Comments)    Itching (severe) and chest tightness  . Penicillins Anaphylaxis, Swelling, Rash and Other (See Comments)    Has patient had a PCN reaction causing immediate rash, facial/tongue/throat swelling, SOB or lightheadedness  with hypotension: Yes Has patient had a PCN reaction causing severe rash involving mucus membranes or skin necrosis: Unknown Has patient had a PCN reaction that required hospitalization: Unknown Has patient had a PCN reaction occurring within the last 10 years: No If all of the above answers are "NO", then may proceed with Cephalosporin use.   . Amlodipine Swelling    Pedal edema  . Anectine [Succinylcholine] Other (See Comments)    Brother went into cardiac arrest.  . Codeine Nausea Only  . Gabapentin Other (See Comments)    Gait abnormality  . Influenza Vaccines Other (See Comments)    Muscle weakness; unable to walk  . Nortriptyline Other (See Comments)    Eye swelling and mouth drawed up  . Pamelor [Nortriptyline Hcl] Other (See Comments)    Patient states caused her face to draw in together.  . Valsartan Other (See Comments) and Cough    Allergy to generic only, "Hacking" cough  . Erythromycin Rash and Swelling  . Sulfa Antibiotics Rash   Family History  Problem Relation Age of Onset  . CAD Mother 42       MI, aortic valve issues  . COPD  Mother   . Lupus Mother   . Graves' disease Mother   . Rheum arthritis Mother   . CAD Father 70       CABG x2, aortic valve replacement  . Stroke Sister   . CAD Sister   . Anuerysm Sister        brain  . Lupus Sister   . Diabetes Sister   . Alcohol abuse Brother   . CAD Brother 1       MI  . Stroke Brother   . Seizures Son   . COPD Brother        agent orange  . CAD Brother 31       stent  . Diabetes Brother   . Depression Grandchild   . Breast cancer Maternal Aunt   . Diabetes Sister   . Breast cancer Sister   . Breast cancer Maternal Aunt   . Stroke Maternal Grandmother   . Hypertension Maternal Grandmother   . Gallbladder disease Maternal Grandmother   . Colon cancer Neg Hx   . Esophageal cancer Neg Hx   . Rectal cancer Neg Hx   . Stomach cancer Neg Hx    PE: BP 110/70   Pulse 80   Ht _0  (1.6 m)   Wt 158 lb 6.4 oz (71.8 kg)   SpO2 99%   BMI 28.06 kg/m  Wt Readings from Last 3 Encounters:  09/04/20 158 lb 6.4 oz (71.8 kg)  09/01/20 157 lb 8 oz (71.4 kg)  08/12/20 158 lb 14.4 oz (72.1 kg)   Constitutional: normal weight, in NAD Eyes: PERRLA, EOMI, no exophthalmos ENT: moist mucous membranes, + palpable symmetric thyroid, no cervical lymphadenopathy Cardiovascular: RRR, No MRG Respiratory: CTA B Gastrointestinal: abdomen soft, NT, ND, BS+ Musculoskeletal: no deformities, strength intact in all 4 Skin: moist, warm, no rashes, + acne on face (believed to be from NovoLog Neurological: no tremor with outstretched hands, DTR normal in all 4  ASSESSMENT: 1. DM1, uncontrolled, without long-term complications, but with hyperglycemia  2.  Hashimoto's hypothyroidism  3. HL  PLAN:  1. Patient with longstanding, uncontrolled, LADA, back on insulin pump after coming off her Medtronic pump due to problems with the supplier.  When she started back on the pump, she started on a t:slim X2, but without  the Dexcom G6 CGM.  Visit, she was waiting for shipment of  sensors. In the past, she had irritation at the site of the pump attachment and I advised her to use skin tac or Flonase to prepare the skin before attaching the pump.  However, she did not have these problems with the new pump. - At last visit, sugars were higher, mostly in the 200s, and quite variable.  They were highest in the morning, and improves during the day, with few exceptions.  She did not have significant low blood sugars.  At last visit, we discussed about the importance of attaching the CGM as soon as possible and also the importance of introducing carbs into the pump.  She was not doing a good job for this, missing the entries for many meals.  She was bolusing based on the blood sugars or doing blind boluses.  We discussed that this was not conducive to good control.  However, at that time she was told me that she was not eating well during the day and I advised her that if she was not eating, she still needed to check blood sugars every 4 hours and correct the high blood sugars.  Also, she was not bolusing 15 minutes before meals and I again emphasized the importance of doing so.  Her sugars were quite high at night and also she was waking up with high blood sugar so at last visit we increased her basal rates -At today's visit, not only did she did not restart her Dexcom but she is also off the pump as it was giving her errors.  Unfortunately, she did not let me know about this so she is now only on NovoLog, without any long-acting insulin.  At today's visit, we downloaded her meter and her sugars are very high, mostly in the 300s with one blood sugar in the low 100s and 1 in the low 400s.  We discussed about the risk that she took when she came off the pump without starting basal insulin.  We discussed about DKA and the fact that she was likely not to develop this.  We can stay without an insulin pump, but he states, she needs to have basal "insulin.  Therefore, at this visit, I will send a  prescription for [name] to her pharmacy.  We will start at 20 units and increase as needed.  For now, I cannot change her NovoLog regimen.  She tells me that she is using 1 unit for each carb equivalent, which is approximately 15 g of carbs.  We will continue this dose.  I also gave her a new sliding scale.  She was using a target of 140 instead of the recommended 130.  I spelled out the sliding scale for her to be easier for her to calculate. -She does not she is interested in going back to the previous Medtronic pump.  I gave her the contact information for the Medtronic rep.  - I advised her to:  Patient Instructions  Please restart: - Tresiba 20 units in am (if sugars remain high after starting this, please increase to 24 units)  Continue: - Novolog: ICR: 1:15 Target: 130 Insulin sensitivity factor: 20 130-150: +1 unit 151-170: +2 units 171-190: +3 units 191-210: +4 units 211-230: +5 units 231-250: +6 units >250: +7 units If you correct sugars at bedtime, only correct >200 and only give 2 to 3 units.  Please contact the Medtronic rep, Deberah Castle.  Please return in 1.5  months with your sugar log.   - we checked her HbA1c: 12% (MUCH higher) - advised to check sugars at different times of the day - 4x a day, rotating check times - advised for yearly eye exams >> she is not UTD - will schedule an appt in [place] - return to clinic in 1.5 months  2.  Hashimoto's hypothyroidism - she does have a palpable thyroid, most likely due to inflammation in the setting of Hashimoto's thyroiditis. - latest thyroid labs reviewed with pt >> normal: Lab Results  Component Value Date   TSH 1.73 11/27/2019   - she continues on LT4 75 mcg 6 out of 7 days-equivalent of 64 mcg daily - pt feels good on this dose. - we discussed about taking the thyroid hormone every day, with water, >30 minutes before breakfast, separated by >4 hours from acid reflux medications, calcium, iron, multivitamins. Pt.  is taking it correctly. - will check TFTs at next OV  3. HL -Reviewed latest lipid panel from 06/2020: LDL at goal, triglycerides slightly high: Lab Results  Component Value Date   CHOL 183 07/08/2020   HDL 53.50 07/08/2020   LDLCALC 95 07/08/2020   LDLDIRECT 177.0 08/09/2016   TRIG 175.0 (H) 07/08/2020   CHOLHDL 3 07/08/2020  -He continues on Crestor 10.  He has some muscle aches, unclear if related to the statin.  Philemon Kingdom, MD PhD [facility] Endocrinology

## 2020-09-08 ENCOUNTER — Other Ambulatory Visit: Payer: Self-pay | Admitting: Vascular Surgery

## 2020-09-09 ENCOUNTER — Ambulatory Visit (INDEPENDENT_AMBULATORY_CARE_PROVIDER_SITE_OTHER): Payer: PPO

## 2020-09-09 ENCOUNTER — Other Ambulatory Visit (INDEPENDENT_AMBULATORY_CARE_PROVIDER_SITE_OTHER): Payer: PPO

## 2020-09-09 ENCOUNTER — Other Ambulatory Visit: Payer: Self-pay | Admitting: Family Medicine

## 2020-09-09 ENCOUNTER — Other Ambulatory Visit: Payer: Self-pay

## 2020-09-09 DIAGNOSIS — E038 Other specified hypothyroidism: Secondary | ICD-10-CM | POA: Diagnosis not present

## 2020-09-09 DIAGNOSIS — E1169 Type 2 diabetes mellitus with other specified complication: Secondary | ICD-10-CM | POA: Diagnosis not present

## 2020-09-09 DIAGNOSIS — E559 Vitamin D deficiency, unspecified: Secondary | ICD-10-CM

## 2020-09-09 DIAGNOSIS — Z0000 Encounter for general adult medical examination without abnormal findings: Secondary | ICD-10-CM | POA: Diagnosis not present

## 2020-09-09 DIAGNOSIS — E785 Hyperlipidemia, unspecified: Secondary | ICD-10-CM

## 2020-09-09 DIAGNOSIS — E063 Autoimmune thyroiditis: Secondary | ICD-10-CM

## 2020-09-09 DIAGNOSIS — Z1159 Encounter for screening for other viral diseases: Secondary | ICD-10-CM

## 2020-09-09 LAB — COMPREHENSIVE METABOLIC PANEL
ALT: 15 U/L (ref 0–35)
AST: 16 U/L (ref 0–37)
Albumin: 3.9 g/dL (ref 3.5–5.2)
Alkaline Phosphatase: 80 U/L (ref 39–117)
BUN: 9 mg/dL (ref 6–23)
CO2: 28 mEq/L (ref 19–32)
Calcium: 9.6 mg/dL (ref 8.4–10.5)
Chloride: 105 mEq/L (ref 96–112)
Creatinine, Ser: 0.93 mg/dL (ref 0.40–1.20)
GFR: 59 mL/min — ABNORMAL LOW (ref >60.00)
Glucose, Bld: 223 mg/dL — ABNORMAL HIGH (ref 70–99)
Potassium: 3.9 mEq/L (ref 3.5–5.1)
Sodium: 141 mEq/L (ref 135–145)
Total Bilirubin: 0.3 mg/dL (ref 0.2–1.2)
Total Protein: 6.2 g/dL (ref 6.0–8.3)

## 2020-09-09 LAB — LDL CHOLESTEROL, DIRECT: Direct LDL: 113 mg/dL

## 2020-09-09 LAB — VITAMIN D 25 HYDROXY (VIT D DEFICIENCY, FRACTURES): VITD: 35.41 ng/mL (ref 30.00–100.00)

## 2020-09-09 LAB — T4, FREE: Free T4: 0.71 ng/dL (ref 0.60–1.60)

## 2020-09-09 LAB — TSH: TSH: 2.16 u[IU]/mL (ref 0.35–4.50)

## 2020-09-09 NOTE — Patient Instructions (Signed)
Cassandra Benson , Thank you for taking time to come for your Medicare Wellness Visit. I appreciate your ongoing commitment to your health goals. Please review the following plan we discussed and let me know if I can assist you in the future.   Screening recommendations/referrals: Colonoscopy: Up to date, completed 03/25/2020, no longer required Mammogram: Up to date, completed 03/02/2020, due 02/2021 Bone Density: due, will discuss with provider at physical  Recommended yearly ophthalmology/optometry visit for glaucoma screening and checkup Recommended yearly dental visit for hygiene and checkup  Vaccinations: Influenza vaccine: not candidate for flu vaccine, declined  Pneumococcal vaccine: Completed series Tdap vaccine: Up to date, completed 10/07/2011, due 09/2021 Shingles vaccine: due, check with your insurance regarding coverage/cost if interested    Covid-19:declined  Advanced directives: copy in chart  Conditions/risks identified: Diabetes, Hypertension  Next appointment: Follow up in one year for your annual wellness visit    Preventive Care 65 Years and Older, Female Preventive care refers to lifestyle choices and visits with your health care provider that can promote health and wellness. What does preventive care include?  A yearly physical exam. This is also called an annual well check.  Dental exams once or twice a year.  Routine eye exams. Ask your health care provider how often you should have your eyes checked.  Personal lifestyle choices, including:  Daily care of your teeth and gums.  Regular physical activity.  Eating a healthy diet.  Avoiding tobacco and drug use.  Limiting alcohol use.  Practicing safe sex.  Taking low-dose aspirin every day.  Taking vitamin and mineral supplements as recommended by your health care provider. What happens during an annual well check? The services and screenings done by your health care provider during your annual well check  will depend on your age, overall health, lifestyle risk factors, and family history of disease. Counseling  Your health care provider may ask you questions about your:  Alcohol use.  Tobacco use.  Drug use.  Emotional well-being.  Home and relationship well-being.  Sexual activity.  Eating habits.  History of falls.  Memory and ability to understand (cognition).  Work and work Statistician.  Reproductive health. Screening  You may have the following tests or measurements:  Height, weight, and BMI.  Blood pressure.  Lipid and cholesterol levels. These may be checked every 5 years, or more frequently if you are over 108 years old.  Skin check.  Lung cancer screening. You may have this screening every year starting at age 76 if you have a 30-pack-year history of smoking and currently smoke or have quit within the past 15 years.  Fecal occult blood test (FOBT) of the stool. You may have this test every year starting at age 62.  Flexible sigmoidoscopy or colonoscopy. You may have a sigmoidoscopy every 5 years or a colonoscopy every 10 years starting at age 39.  Hepatitis C blood test.  Hepatitis B blood test.  Sexually transmitted disease (STD) testing.  Diabetes screening. This is done by checking your blood sugar (glucose) after you have not eaten for a while (fasting). You may have this done every 1-3 years.  Bone density scan. This is done to screen for osteoporosis. You may have this done starting at age 7.  Mammogram. This may be done every 1-2 years. Talk to your health care provider about how often you should have regular mammograms. Talk with your health care provider about your test results, treatment options, and if necessary, the need for more  tests. Vaccines  Your health care provider may recommend certain vaccines, such as:  Influenza vaccine. This is recommended every year.  Tetanus, diphtheria, and acellular pertussis (Tdap, Td) vaccine. You may  need a Td booster every 10 years.  Zoster vaccine. You may need this after age 48.  Pneumococcal 13-valent conjugate (PCV13) vaccine. One dose is recommended after age 42.  Pneumococcal polysaccharide (PPSV23) vaccine. One dose is recommended after age 24. Talk to your health care provider about which screenings and vaccines you need and how often you need them. This information is not intended to replace advice given to you by your health care provider. Make sure you discuss any questions you have with your health care provider. Document Released: 10/09/2015 Document Revised: 06/01/2016 Document Reviewed: 07/14/2015 Elsevier Interactive Patient Education  2017 [employer] Prevention in the Home Falls can cause injuries. They can happen to people of all ages. There are many things you can do to make your home safe and to help prevent falls. What can I do on the outside of my home?  Regularly fix the edges of walkways and driveways and fix any cracks.  Remove anything that might make you trip as you walk through a door, such as a raised step or threshold.  Trim any bushes or trees on the path to your home.  Use bright outdoor lighting.  Clear any walking paths of anything that might make someone trip, such as rocks or tools.  Regularly check to see if handrails are loose or broken. Make sure that both sides of any steps have handrails.  Any raised decks and porches should have guardrails on the edges.  Have any leaves, snow, or ice cleared regularly.  Use sand or salt on walking paths during winter.  Clean up any spills in your garage right away. This includes oil or grease spills. What can I do in the bathroom?  Use night lights.  Install grab bars by the toilet and in the tub and shower. Do not use towel bars as grab bars.  Use non-skid mats or decals in the tub or shower.  If you need to sit down in the shower, use a plastic, non-slip stool.  Keep the floor  dry. Clean up any water that spills on the floor as soon as it happens.  Remove soap buildup in the tub or shower regularly.  Attach bath mats securely with double-sided non-slip rug tape.  Do not have throw rugs and other things on the floor that can make you trip. What can I do in the bedroom?  Use night lights.  Make sure that you have a light by your bed that is easy to reach.  Do not use any sheets or blankets that are too big for your bed. They should not hang down onto the floor.  Have a firm chair that has side arms. You can use this for support while you get dressed.  Do not have throw rugs and other things on the floor that can make you trip. What can I do in the kitchen?  Clean up any spills right away.  Avoid walking on wet floors.  Keep items that you use a lot in easy-to-reach places.  If you need to reach something above you, use a strong step stool that has a grab bar.  Keep electrical cords out of the way.  Do not use floor polish or wax that makes floors slippery. If you must use wax, use non-skid floor  wax.  Do not have throw rugs and other things on the floor that can make you trip. What can I do with my stairs?  Do not leave any items on the stairs.  Make sure that there are handrails on both sides of the stairs and use them. Fix handrails that are broken or loose. Make sure that handrails are as long as the stairways.  Check any carpeting to make sure that it is firmly attached to the stairs. Fix any carpet that is loose or worn.  Avoid having throw rugs at the top or bottom of the stairs. If you do have throw rugs, attach them to the floor with carpet tape.  Make sure that you have a light switch at the top of the stairs and the bottom of the stairs. If you do not have them, ask someone to add them for you. What else can I do to help prevent falls?  Wear shoes that:  Do not have high heels.  Have rubber bottoms.  Are comfortable and fit you  well.  Are closed at the toe. Do not wear sandals.  If you use a stepladder:  Make sure that it is fully opened. Do not climb a closed stepladder.  Make sure that both sides of the stepladder are locked into place.  Ask someone to hold it for you, if possible.  Clearly mark and make sure that you can see:  Any grab bars or handrails.  First and last steps.  Where the edge of each step is.  Use tools that help you move around (mobility aids) if they are needed. These include:  Canes.  Walkers.  Scooters.  Crutches.  Turn on the lights when you go into a dark area. Replace any light bulbs as soon as they burn out.  Set up your furniture so you have a clear path. Avoid moving your furniture around.  If any of your floors are uneven, fix them.  If there are any pets around you, be aware of where they are.  Review your medicines with your doctor. Some medicines can make you feel dizzy. This can increase your chance of falling. Ask your doctor what other things that you can do to help prevent falls. This information is not intended to replace advice given to you by your health care provider. Make sure you discuss any questions you have with your health care provider. Document Released: 07/09/2009 Document Revised: 02/18/2016 Document Reviewed: 10/17/2014 Elsevier Interactive Patient Education  2017 [employer].

## 2020-09-09 NOTE — Progress Notes (Signed)
Subjective:   Cassandra Benson is a 79 y.o. female who presents for Medicare Annual (Subsequent) preventive examination.  Review of Systems: N/A      I connected with the patient today by telephone and verified that I am speaking with the correct person using two identifiers. Location patient: home Location nurse: work Persons participating in the telephone visit: patient, nurse.   I discussed the limitations, risks, security and privacy concerns of performing an evaluation and management service by telephone and the availability of in person appointments. I also discussed with the patient that there may be a patient responsible charge related to this service. The patient expressed understanding and verbally consented to this telephonic visit.        Cardiac Risk Factors include: advanced age (>35mn, >>49women);diabetes mellitus;hypertension     Objective:    Today's Vitals   There is no height or weight on file to calculate BMI.  Advanced Directives 09/09/2020 07/16/2020 01/16/2020 01/15/2020 01/14/2020 01/13/2020 10/07/2018  Does Patient Have a Medical Advance Directive? Yes No - Yes Yes Yes Yes  Type of AParamedicof AGypsumLiving will - HShell PointLiving will HSaybrookLiving will HMetamoraLiving will - Living will  Does patient want to make changes to medical advance directive? - - - No - Patient declined - - -  Copy of HDu [place] Chart? Yes - validated most recent copy scanned in chart (See row information) - - - - - -  Would patient like information on creating a medical advance directive? - - - - - - -    Current Medications (verified) Outpatient Encounter Medications as of 09/09/2020  Medication Sig  . acetaminophen (TYLENOL) 500 MG tablet Take 1 tablet (500 mg total) by mouth 3 (three) times daily as needed.  . Ascorbic Acid (VITAMIN C) 100 MG tablet Take 100 mg by  mouth daily.  .Marland Kitchenaspirin EC 81 MG tablet Take 81 mg by mouth daily.  . BD ULTRA-FINE LANCETS lancets Use as instructed upto 6 times daily  . benzonatate (TESSALON) 100 MG capsule Take 1 capsule (100 mg total) by mouth 3 (three) times daily as needed for cough.  . clopidogrel (PLAVIX) 75 MG tablet Take 1 tablet (75 mg total) by mouth daily.  . Continuous Blood Gluc Transmit (DEXCOM G6 TRANSMITTER) MISC 1 kit by Does not apply route See admin instructions. Use to check blood sugar  . diclofenac Sodium (VOLTAREN) 1 % GEL Apply 2 g topically 3 (three) times daily.  .Marland Kitchenezetimibe (ZETIA) 10 MG tablet Take 1 tablet (10 mg total) by mouth daily.  .Marland Kitchenglucagon 1 MG injection Inject 1 mg into the muscle once as needed for up to 1 dose.  .Marland Kitchenglucose blood (CONTOUR NEXT TEST) test strip Use as instructed to check 4 times daily  . insulin aspart (NOVOLOG) 100 UNIT/ML injection Sliding scale-no more than 50 units per day  . insulin degludec (TRESIBA FLEXTOUCH) 100 UNIT/ML FlexTouch Pen Inject 20 Units into the skin daily.  . Insulin Pen Needle 32G X 4 MM MISC Use 4-5x a day  . losartan (COZAAR) 25 MG tablet Take 1 tablet (25 mg total) by mouth daily. (Patient taking differently: Take 25 mg by mouth in the morning and at bedtime.)  . memantine (NAMENDA) 5 MG tablet Take 1 tablet (5 mg total) by mouth 2 (two) times daily.  . metoprolol succinate (TOPROL-XL) 50 MG 24 hr tablet Take 50  mg by mouth at bedtime. Take with or immediately following a meal.  . nitroGLYCERIN (NITROSTAT) 0.4 MG SL tablet Place 1 tablet (0.4 mg total) under the tongue every 5 (five) minutes as needed for chest pain.  . pregabalin (LYRICA) 25 MG capsule Take 25 mg by mouth daily.   . rosuvastatin (CRESTOR) 10 MG tablet TAKE 1 TABLET(10 MG) BY MOUTH DAILY  . SYNTHROID 75 MCG tablet Take 1 tablet (75 mcg total) by mouth daily before breakfast. Don't take any on Sundays   No facility-administered encounter medications on file as of 09/09/2020.     Allergies (verified) Iodinated diagnostic agents, Penicillins, Amlodipine, Anectine [succinylcholine], Codeine, Gabapentin, Influenza vaccines, Nortriptyline, Pamelor [nortriptyline hcl], Valsartan, Erythromycin, and Sulfa antibiotics   History: Past Medical History:  Diagnosis Date  . Allergy   . ANA positive    positive ANA pattern 1 speckled  . Arthritis   . Carotid stenosis, asymptomatic 06/19/2015   6-22% RICA 63-33% LICA rpt 1 yr (01/4561)   . Colon polyps   . Dermatomyositis ([place])   . Diabetes mellitus without complication (HCC)    Type 1  . Diverticulosis    sigmoid on CT scan 12/2019  . Family history of adverse reaction to anesthesia    brothr went into cardiac arrest from anectine  . Fibromyalgia    prior PCP  . GERD (gastroesophageal reflux disease)    prior PCP  . Glaucoma    Narrow angle  . History of blood clots    DVT, in 20s, none since  . History of chicken pox   . History of diverticulitis   . History of pericarditis 1986   with hospitalization  . History of pneumonia 2014  . History of shingles   . History of UTI   . Hyperlipidemia   . Hypertension   . Hypothyroidism   . Mixed connective tissue disease ([place])   . Partial small bowel obstruction ([place]) 12/2019   managed conservatively  . Peptic ulcer   . Pneumonia   . PONV (postoperative nausea and vomiting)   . Raynaud's disease without gangrene   . Shoulder pain left   h/o RTC tendonitis and adhesive capsulitis  . Sjogren's syndrome ([place])   . Sleep apnea    prior PCP - no CPAP for about 10 yrs  . 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[place])   . Vitamin D deficiency    prior PCP   Past Surgical History:  Procedure Laterality Date  . ABDOMINAL HYSTERECTOMY  1978   fibroids and menorrhagia, ovaries remain  . ARTERY BIOPSY Right 04/06/2018   Procedure: BIOPSY TEMPORAL ARTERY RIGHT;  Surgeon: Beverly Gust, MD;  Location: [facility];  Service: ENT;  Laterality: Right;  Diabetic - insulin pump sleep  apnea  . [facility] normal per patient  . COLONOSCOPY  10/2011   1 TA, 1 HP, very tortuous colon ([place])  . COLONOSCOPY WITH ESOPHAGOGASTRODUODENOSCOPY (EGD)  03/2007   2 ulcers, benign polyp, rpt 5 yrs ([place], [place])  . JOINT REPLACEMENT Right    hip  . PARTIAL HIP ARTHROPLASTY  2013   Right hip replacement  . TONSILLECTOMY    . TONSILLECTOMY AND ADENOIDECTOMY    . TRANSCAROTID ARTERY REVASCULARIZATION Left 07/23/2018   Procedure: TRANSCAROTID ARTERY REVASCULARIZATION;  Surgeon: Marty Heck, MD;  Location: [place];  Service: Vascular;  Laterality: Left;  . TUBAL LIGATION    . VAGINAL DELIVERY     x2, no complications  Family History  Problem Relation Age of Onset  . CAD Mother 51       MI, aortic valve issues  . COPD Mother   . Lupus Mother   . Graves' disease Mother   . Rheum arthritis Mother   . CAD Father 74       CABG x2, aortic valve replacement  . Stroke Sister   . CAD Sister   . Anuerysm Sister        brain  . Lupus Sister   . Diabetes Sister   . Alcohol abuse Brother   . CAD Brother 45       MI  . Stroke Brother   . Seizures Son   . COPD Brother        agent orange  . CAD Brother 20       stent  . Diabetes Brother   . Depression Grandchild   . Breast cancer Maternal Aunt   . Diabetes Sister   . Breast cancer Sister   . Breast cancer Maternal Aunt   . Stroke Maternal Grandmother   . Hypertension Maternal Grandmother   . Gallbladder disease Maternal Grandmother   . Colon cancer Neg Hx   . Esophageal cancer Neg Hx   . Rectal cancer Neg Hx   . Stomach cancer Neg Hx    Social History   Socioeconomic History  . Marital status: Widowed    Spouse name: Not on file  . Number of children: 2  . Years of education: Not on file  . Highest education level: Not on file  Occupational History  . Occupation: retired  Tobacco Use  . Smoking status: Never Smoker  . Smokeless tobacco: Never Used  Vaping  Use  . Vaping Use: Never used  Substance and Sexual Activity  . Alcohol use: No  . Drug use: No  . Sexual activity: Not on file  Other Topics Concern  . Not on file  Social History Narrative   Lives in [place] now. Recently moved from [place]. Widow - husband decreased 01/2016 of metastatic colon CA   No pets.   Mother of Karington Zarazua.   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 Difficulty of Paying Living Expenses: Not hard at all  Food Insecurity: No Food Insecurity  . Worried About Charity fundraiser in the Last Year: Never true  . Ran Out of Food in the Last Year: Never true  Transportation Needs: No Transportation Needs  . Lack of Transportation (Medical): No  . Lack of Transportation (Non-Medical): No  Physical Activity: Insufficiently Active  . Days of Exercise per Week: 7 days  . Minutes of Exercise per Session: 20 min  Stress: No Stress Concern Present  . Feeling of Stress : Not at all  Social Connections: Not on file    Tobacco Counseling Counseling given: Not Answered   Clinical Intake:  Pre-visit preparation completed: Yes  Pain : No/denies pain     Nutritional Risks: None Diabetes: Yes CBG done?: No Did pt. bring in CBG monitor from home?: No  How often do you need to have someone help you when you read instructions, pamphlets, or other written materials from your  doctor or pharmacy?: 1 - Never What is the last grade level you completed in school?: 3 years of college  Diabetic: Yes Nutrition Risk Assessment:  Has the patient had any N/V/D within the last 2 months?  No  Does the patient have any non-healing wounds?  No  Has the patient had any unintentional weight loss or  weight gain?  No   Diabetes:  Is the patient diabetic?  Yes  If diabetic, was a CBG obtained today?  No telephone visit Did the patient bring in their glucometer from home?  No telephone visit  How often do you monitor your CBG's? 2-3 times a day.   Financial Strains and Diabetes Management:  Are you having any financial strains with the device, your supplies or your medication? No .  Does the patient want to be seen by Chronic Care Management for management of their diabetes?  No  Would the patient like to be referred to a Nutritionist or for Diabetic Management?  No   Diabetic Exams:  Diabetic Eye Exam: Overdue for diabetic eye exam. Pt has been advised about the importance in completing this exam. Patient advised to call and schedule an eye exam. Diabetic Foot Exam: Overdue, Pt has been advised about the importance in completing this exam. Pt is scheduled for diabetic foot exam on 09/16/2020.   Interpreter Needed?: No  Information entered by :: CJohnson, LPN   Activities of Daily Living In your present state of health, do you have any difficulty performing the following activities: 09/09/2020 01/15/2020  Hearing? N N  Vision? N N  Difficulty concentrating or making decisions? Y N  Comment some dementia noted -  Walking or climbing stairs? N N  Dressing or bathing? N N  Doing errands, shopping? N N  Preparing Food and eating ? N -  Using the Toilet? N -  In the past six months, have you accidently leaked urine? N -  Do you have problems with loss of bowel control? N -  Managing your Medications? N -  Managing your Finances? N -  Housekeeping or managing your Housekeeping? N -  Some recent data might be hidden    Patient Care Team: [name], MD as PCP - General (Family Medicine) Rockey Situ Kathlene November, MD as PCP - Cardiology (Cardiology) Birder Robson, MD as Referring Physician (Ophthalmology)  Indicate any recent Medical Services you may have received from  other than [facility] providers in the past year (date may be approximate).     Assessment:   This is a routine wellness examination for Miyu.  Hearing/Vision screen  Hearing Screening   '125Hz'  '250Hz'  '500Hz'  '1000Hz'  '2000Hz'  '3000Hz'  '4000Hz'  '6000Hz'  '8000Hz'   Right ear:           Left ear:           Vision Screening Comments: Patient gets annual eye exams   Dietary issues and exercise activities discussed: Current Exercise Habits: Home exercise routine, Type of exercise: walking, Time (Minutes): 20, Frequency (Times/Week): 7, Weekly Exercise (Minutes/Week): 140, Intensity: Moderate, Exercise limited by: None identified  Goals    . Increase physical activity     Starting 08/29/2018, I will attempt to walk at least 30 minutes 3 days per week.     . Patient Stated     09/09/2020, I will continue to walk daily for about 15-20 minutes.       Depression Screen PHQ 2/9 Scores 09/09/2020 01/13/2020 09/13/2019 08/29/2018 01/29/2018 08/21/2017 01/26/2017  PHQ - 2 Score 0  0 0 0 0 0 0  PHQ- 9 Score 0 - - 0 - - -    Fall Risk Fall Risk  09/09/2020 01/13/2020 09/13/2019 08/29/2018 08/21/2017  Falls in the past year? '1 1 1 ' 0 No  Comment tripped and fell - - - -  Number falls in past yr: 0 0 0 - -  Comment - fell in yard, bruised hip - - -  Injury with Fall? '1 1 1 ' - -  Risk for fall due to : Medication side effect - - - -  Follow up Falls evaluation completed;Falls prevention discussed - - - -    FALL RISK PREVENTION PERTAINING TO THE HOME:  Any stairs in or around the home? Yes  If so, are there any without handrails? No  Home free of loose throw rugs in walkways, pet beds, electrical cords, etc? Yes  Adequate lighting in your home to reduce risk of falls? Yes   ASSISTIVE DEVICES UTILIZED TO PREVENT FALLS:  Life alert? No  Use of a cane, walker or w/c? Yes , cane Grab bars in the bathroom? No  Shower chair or bench in shower? No  Elevated toilet seat or a handicapped toilet? No   TIMED UP AND  GO:  Was the test performed? N/A, telephone visit .    Cognitive Function: MMSE - Mini Mental State Exam 09/09/2020 08/29/2018 08/09/2016  Orientation to time '5 5 5  ' Orientation to Place '5 5 5  ' Registration '3 3 3  ' Attention/ Calculation 5 0 0  Recall '3 3 3  ' Language- name 2 objects - 0 0  Language- repeat '1 1 1  ' Language- follow 3 step command - 3 3  Language- read & follow direction - 0 0  Write a sentence - 0 0  Copy design - 0 0  Total score - 20 20  Mini Cog  Mini-Cog screen was completed. Maximum score is 22. A value of 0 denotes this part of the MMSE was not completed or the patient failed this part of the Mini-Cog screening.       Immunizations Immunization History  Administered Date(s) Administered  . Pneumococcal Conjugate-13 09/23/2014  . Pneumococcal Polysaccharide-23 01/05/2010, 07/17/2013  . Tdap 10/07/2011    TDAP status: Up to date   Flu Vaccine status: Declined, Education has been provided regarding the importance of this vaccine but patient still declined. Advised may receive this vaccine at local pharmacy or Health Dept. Aware to provide a copy of the vaccination record if obtained from local pharmacy or Health Dept. Verbalized acceptance and understanding.  Pneumococcal vaccine status: Up to date  Covid-19 vaccine status: Declined, Education has been provided regarding the importance of this vaccine but patient still declined. Advised may receive this vaccine at local pharmacy or Health Dept.or vaccine clinic. Aware to provide a copy of the vaccination record if obtained from local pharmacy or Health Dept. Verbalized acceptance and understanding.  Qualifies for Shingles Vaccine? Yes   Zostavax completed No   Shingrix Completed?: No.    Education has been provided regarding the importance of this vaccine. Patient has been advised to call insurance company to determine out of pocket expense if they have not yet received this vaccine. Advised may also  receive vaccine at local pharmacy or Health Dept. Verbalized acceptance and understanding.  Screening Tests Health Maintenance  Topic Date Due  . Hepatitis C Screening  Never done  . OPHTHALMOLOGY EXAM  06/25/2020  . FOOT EXAM  07/22/2020  .  COVID-19 Vaccine (1) 09/25/2024 (Originally 06/27/1953)  . MAMMOGRAM  03/02/2021  . HEMOGLOBIN A1C  03/05/2021  . TETANUS/TDAP  10/06/2021  . DEXA SCAN  Completed  . PNA vac Low Risk Adult  Completed    Health Maintenance  Health Maintenance Due  Topic Date Due  . Hepatitis C Screening  Never done  . OPHTHALMOLOGY EXAM  06/25/2020  . FOOT EXAM  07/22/2020    Colorectal cancer screening: Type of screening: Colonoscopy. Completed 03/25/2020. No longer required  Mammogram status: Completed 03/02/2020. Repeat every year  Bone Density status: due, will discuss with provider at physical   Lung Cancer Screening: (Low Dose CT Chest recommended if Age 51-80 years, 30 pack-year currently smoking OR have quit w/in 15 years.) does not qualify.    Additional Screening:  Hepatitis C Screening: does qualify; Completed due  Vision Screening: Recommended annual ophthalmology exams for early detection of glaucoma and other disorders of the eye. Is the patient up to date with their annual eye exam?  Yes  Who is the provider or what is the name of the office in which the patient attends annual eye exams? Dr. Satira Sark If pt is not established with a provider, would they like to be referred to a provider to establish care? No .   Dental Screening: Recommended annual dental exams for proper oral hygiene  Community Resource Referral / Chronic Care Management: CRR required this visit?  No   CCM required this visit?  No      Plan:     I have personally reviewed and noted the following in the patient's chart:   . Medical and social history . Use of alcohol, tobacco or illicit drugs  . Current medications and supplements . Functional ability and  status . Nutritional status . Physical activity . Advanced directives . List of other physicians . Hospitalizations, surgeries, and ER visits in previous 12 months . Vitals . Screenings to include cognitive, depression, and falls . Referrals and appointments  In addition, I have reviewed and discussed with patient certain preventive protocols, quality metrics, and best practice recommendations. A written personalized care plan for preventive services as well as general preventive health recommendations were provided to patient.   Due to this being a telephonic visit, the after visit summary with patients personalized plan was offered to patient via office or my-chart. Patient preferred to pick up at office at next visit or via mychart.   Andrez Grime, LPN   89/21/1940

## 2020-09-09 NOTE — Progress Notes (Signed)
PCP notes:  Health Maintenance: Flu- declined Covid- declined Eye exam- due,  Patient will schedule  Dexa- due    Abnormal Screenings: none   Patient concerns: none   Nurse concerns: none   Next PCP appt.: 09/16/2020 @ 9 am

## 2020-09-10 ENCOUNTER — Other Ambulatory Visit: Payer: Self-pay | Admitting: Vascular Surgery

## 2020-09-10 LAB — HEPATITIS C ANTIBODY
Hepatitis C Ab: NONREACTIVE
SIGNAL TO CUT-OFF: 0.01 (ref <1.00)

## 2020-09-16 ENCOUNTER — Other Ambulatory Visit: Payer: Self-pay

## 2020-09-16 ENCOUNTER — Encounter: Payer: Self-pay | Admitting: Family Medicine

## 2020-09-16 ENCOUNTER — Ambulatory Visit (INDEPENDENT_AMBULATORY_CARE_PROVIDER_SITE_OTHER): Payer: PPO | Admitting: Family Medicine

## 2020-09-16 VITALS — BP 134/62 | HR 87 | Temp 97.5°F | Ht 63.5 in | Wt 160.2 lb

## 2020-09-16 DIAGNOSIS — E1169 Type 2 diabetes mellitus with other specified complication: Secondary | ICD-10-CM

## 2020-09-16 DIAGNOSIS — M3500 Sicca syndrome, unspecified: Secondary | ICD-10-CM

## 2020-09-16 DIAGNOSIS — R519 Headache, unspecified: Secondary | ICD-10-CM | POA: Diagnosis not present

## 2020-09-16 DIAGNOSIS — E063 Autoimmune thyroiditis: Secondary | ICD-10-CM

## 2020-09-16 DIAGNOSIS — M797 Fibromyalgia: Secondary | ICD-10-CM

## 2020-09-16 DIAGNOSIS — E038 Other specified hypothyroidism: Secondary | ICD-10-CM | POA: Diagnosis not present

## 2020-09-16 DIAGNOSIS — G459 Transient cerebral ischemic attack, unspecified: Secondary | ICD-10-CM

## 2020-09-16 DIAGNOSIS — I10 Essential (primary) hypertension: Secondary | ICD-10-CM

## 2020-09-16 DIAGNOSIS — E785 Hyperlipidemia, unspecified: Secondary | ICD-10-CM

## 2020-09-16 DIAGNOSIS — Z0000 Encounter for general adult medical examination without abnormal findings: Secondary | ICD-10-CM | POA: Diagnosis not present

## 2020-09-16 DIAGNOSIS — E559 Vitamin D deficiency, unspecified: Secondary | ICD-10-CM

## 2020-09-16 DIAGNOSIS — I6522 Occlusion and stenosis of left carotid artery: Secondary | ICD-10-CM

## 2020-09-16 DIAGNOSIS — E139 Other specified diabetes mellitus without complications: Secondary | ICD-10-CM

## 2020-09-16 DIAGNOSIS — I739 Peripheral vascular disease, unspecified: Secondary | ICD-10-CM

## 2020-09-16 DIAGNOSIS — G3184 Mild cognitive impairment, so stated: Secondary | ICD-10-CM

## 2020-09-16 DIAGNOSIS — I872 Venous insufficiency (chronic) (peripheral): Secondary | ICD-10-CM

## 2020-09-16 MED ORDER — ROSUVASTATIN CALCIUM 20 MG PO TABS
20.0000 mg | ORAL_TABLET | Freq: Every day | ORAL | 3 refills | Status: DC
Start: 2020-09-16 — End: 2021-09-22

## 2020-09-16 MED ORDER — VITAMIN D3 25 MCG (1000 UT) PO CAPS: 1.0000 | ORAL_CAPSULE | Freq: Every day | ORAL | Status: DC

## 2020-09-16 MED ORDER — ROSUVASTATIN CALCIUM 10 MG PO TABS
10.0000 mg | ORAL_TABLET | Freq: Every day | ORAL | 3 refills | Status: DC
Start: 2020-09-16 — End: 2020-09-16

## 2020-09-16 MED ORDER — LOSARTAN POTASSIUM 25 MG PO TABS
25.0000 mg | ORAL_TABLET | Freq: Every day | ORAL | 3 refills | Status: DC
Start: 2020-09-16 — End: 2021-09-22

## 2020-09-16 NOTE — Assessment & Plan Note (Signed)
TFTs stable. Appreciate endo care.

## 2020-09-16 NOTE — Patient Instructions (Signed)
Try out higher crestor dose 20mg  daily - take at night time.  Monitor for muscle aches on higher dose.  Goal LDL (bad cholesterol) <100, ideally <70.  Continue daily walking. Return as needed or in 6 months for follow up visit.   Health Maintenance After Age 78 After age 53, you are at a higher risk for certain long-term diseases and infections as well as injuries from falls. Falls are a major cause of broken bones and head injuries in people who are older than age 42. Getting regular preventive care can help to keep you healthy and well. Preventive care includes getting regular testing and making lifestyle changes as recommended by your health care provider. Talk with your health care provider about:  Which screenings and tests you should have. A screening is a test that checks for a disease when you have no symptoms.  A diet and exercise plan that is right for you. What should I know about screenings and tests to prevent falls? Screening and testing are the best ways to find a health problem early. Early diagnosis and treatment give you the best chance of managing medical conditions that are common after age 42. Certain conditions and lifestyle choices may make you more likely to have a fall. Your health care provider may recommend:  Regular vision checks. Poor vision and conditions such as cataracts can make you more likely to have a fall. If you wear glasses, make sure to get your prescription updated if your vision changes.  Medicine review. Work with your health care provider to regularly review all of the medicines you are taking, including over-the-counter medicines. Ask your health care provider about any side effects that may make you more likely to have a fall. Tell your health care provider if any medicines that you take make you feel dizzy or sleepy.  Osteoporosis screening. Osteoporosis is a condition that causes the bones to get weaker. This can make the bones weak and cause them to  break more easily.  Blood pressure screening. Blood pressure changes and medicines to control blood pressure can make you feel dizzy.  Strength and balance checks. Your health care provider may recommend certain tests to check your strength and balance while standing, walking, or changing positions.  Foot health exam. Foot pain and numbness, as well as not wearing proper footwear, can make you more likely to have a fall.  Depression screening. You may be more likely to have a fall if you have a fear of falling, feel emotionally low, or feel unable to do activities that you used to do.  Alcohol use screening. Using too much alcohol can affect your balance and may make you more likely to have a fall. What actions can I take to lower my risk of falls? General instructions  Talk with your health care provider about your risks for falling. Tell your health care provider if: ? You fall. Be sure to tell your health care provider about all falls, even ones that seem minor. ? You feel dizzy, sleepy, or off-balance.  Take over-the-counter and prescription medicines only as told by your health care provider. These include any supplements.  Eat a healthy diet and maintain a healthy weight. A healthy diet includes low-fat dairy products, low-fat (lean) meats, and fiber from whole grains, beans, and lots of fruits and vegetables. Home safety  Remove any tripping hazards, such as rugs, cords, and clutter.  Install safety equipment such as grab bars in bathrooms and safety rails on  stairs.  Keep rooms and walkways well-lit. Activity   Follow a regular exercise program to stay fit. This will help you maintain your balance. Ask your health care provider what types of exercise are appropriate for you.  If you need a cane or walker, use it as recommended by your health care provider.  Wear supportive shoes that have nonskid soles. Lifestyle  Do not drink alcohol if your health care provider tells  you not to drink.  If you drink alcohol, limit how much you have: ? 0-1 drink a day for women. ? 0-2 drinks a day for men.  Be aware of how much alcohol is in your drink. In the [place] one drink equals one typical bottle of beer (12 oz), one-half glass of wine (5 oz), or one shot of hard liquor (1 oz).  Do not use any products that contain nicotine or tobacco, such as cigarettes and e-cigarettes. If you need help quitting, ask your health care provider. Summary  Having a healthy lifestyle and getting preventive care can help to protect your health and wellness after age 53.  Screening and testing are the best way to find a health problem early and help you avoid having a fall. Early diagnosis and treatment give you the best chance for managing medical conditions that are more common for people who are older than age 61.  Falls are a major cause of broken bones and head injuries in people who are older than age 53. Take precautions to prevent a fall at home.  Work with your health care provider to learn what changes you can make to improve your health and wellness and to prevent falls. This information is not intended to replace advice given to you by your health care provider. Make sure you discuss any questions you have with your health care provider. Document Revised: 01/03/2019 Document Reviewed: 07/26/2017 Elsevier Patient Education  2020 [employer].

## 2020-09-16 NOTE — Assessment & Plan Note (Signed)
Encouraged ophtho f/u as due.

## 2020-09-16 NOTE — Assessment & Plan Note (Signed)
Regularly using compression stockings with benefit.

## 2020-09-16 NOTE — Assessment & Plan Note (Signed)
Ongoing, followed by neurology.

## 2020-09-16 NOTE — Assessment & Plan Note (Signed)
Minimal. ?related to FM mental fog vs recent hyperglycemia.  Suggested trial off namenda x 1 month, monitor effect.

## 2020-09-16 NOTE — Assessment & Plan Note (Signed)
Stable period on current oral vit D dose.

## 2020-09-16 NOTE — Assessment & Plan Note (Signed)
Stable period on current regimen - continue.  

## 2020-09-16 NOTE — Assessment & Plan Note (Signed)
Appreciate endo care. Improving levels with recent insulin titration

## 2020-09-16 NOTE — Assessment & Plan Note (Signed)
Continue aspirin, plavix, crestor.

## 2020-09-16 NOTE — Assessment & Plan Note (Signed)
Continue aspirin, statin, plavix.  

## 2020-09-16 NOTE — Assessment & Plan Note (Addendum)
Reviewed with patient. Did not tolerate recent ezetimibe trial. Suggested trial crestor 20mg  daily, update with effect.  Goal LDL <100 given DM hx, ideally <70 given TIA hx The 10-year ASCVD risk score Mikey Bussing DC Jr., et al., 2013) is: 50.6%   Values used to calculate the score:     Age: 79 years     Sex: Female     Is Non-Hispanic African American: No     Diabetic: Yes     Tobacco smoker: No     Systolic Blood Pressure: 248 mmHg     Is BP treated: Yes     HDL Cholesterol: 53.5 mg/dL     Total Cholesterol: 183 mg/dL

## 2020-09-16 NOTE — Progress Notes (Signed)
Patient ID: Cassandra Benson, female    DOB: 1941-04-20, 79 y.o.   MRN: [id number]  This visit was conducted in person.  BP 134/62 (BP Location: Left Arm, Patient Position: Sitting, Cuff Size: Normal)   Pulse 87   Temp (!) 97.5 F (36.4 C) (Temporal)   Ht 5' 3.5" (1.613 m)   Wt 160 lb 4 oz (72.7 kg)   SpO2 99%   BMI 27.94 kg/m    CC: CPE Subjective:   HPI: Cassandra Benson is a 79 y.o. female presenting on 09/16/2020 for Annual Exam (Prt 2. )   Saw health advisor last week for medicare wellness visit. Note reviewed.   No exam data present  Flowsheet Row Clinical Support from 09/09/2020 in [place] at [place]  PHQ-2 Total Score 0      Fall Risk  09/09/2020 01/13/2020 09/13/2019 08/29/2018 08/21/2017  Falls in the past year? '1 1 1 ' 0 No  Comment tripped and fell - - - -  Number falls in past yr: 0 0 0 - -  Comment - fell in yard, bruised hip - - -  Injury with Fall? '1 1 1 ' - -  Risk for fall due to : Medication side effect - - - -  Follow up Falls evaluation completed;Falls prevention discussed - - - -  See prior note for details on recent fall in [place] while visiting family.   Saw Dr Cruzita Lederer earlier this month - now on Tresiba 20u daily. Interested in restarting Medtronic pump.  Saw [facility] last month ([place]) for symptomatic varicose veins with pain, improved with compression stockings.  Sees neurology Dr Melrose Nakayama - ongoing headache, paresthesias/numbness.   Preventative: COLONOSCOPY 10/2011 - 1 TA, 1 HP, very tortuous colon ([place] [facility]) Colonoscopy (02/2020) - TA, inflammatory polyp, mod diverticulosis, int/ext hemorrhoids ([place]) no rpt recommended  Breast cancer screening - mammo 02/2020 [facility] @ [facility]. Does self breast exams at home. Well woman exam - last 2013 s/p abd hysterectomy, ovaries remain. Denies pelvic symptoms DEXA scan - normal per patient 04/2012. Flu shot - ALLERGIC COVID vaccine - discussed, declines - hesitant due to poor reactions  to previous vaccines.  Tdap 07/2012.  Pneumovax 2014, prevnar 2015. Shingrix- declines. Has had shingles. Advanced directive: forms were scanned (08/2017) - HCPOA are son Cassandra Benson and Cassandra Benson. Does not want prolonged life support if terminal condition. HCPOA has authority to override directives. Seat belt use discussed. Sunscreen use discussed, no changing moles on skin  Non smoker Alcohol - none Dentist q4 mo - rec more frequent due to sjogren's Eye exam yearly- stopped going to [facility] - wants to see [place] - no constipation  Bladder - some stress incontinence symptoms  Lives in [place] now. Recently moved from [place].Husbanddeceased mid 2017.No pets.  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 Diet - good water, fruits/vegetables daily, limited meat, protein drink every morning      Relevant past medical, surgical, family and social history reviewed and updated as indicated. Interim medical history since our last visit reviewed. Allergies and medications reviewed and updated. Outpatient Medications Prior to Visit  Medication Sig Dispense Refill  . acetaminophen (TYLENOL) 500 MG tablet Take 1 tablet (500 mg total) by mouth 3 (three) times daily as needed.    . Ascorbic Acid (VITAMIN C)  100 MG tablet Take 100 mg by mouth daily.    Marland Kitchen aspirin EC 81 MG tablet Take 81 mg by mouth daily.    . BD ULTRA-FINE LANCETS lancets Use as instructed upto 6 times daily 600 each 2  . benzonatate (TESSALON) 100 MG capsule Take 1 capsule (100 mg total) by mouth 3 (three) times daily as needed for cough. 30 capsule 3  . clopidogrel (PLAVIX) 75 MG tablet TAKE 1 TABLET(75 MG) BY MOUTH DAILY 30 tablet 6  . Continuous Blood Gluc Transmit (DEXCOM G6 TRANSMITTER) MISC 1 kit by Does not apply route See admin  instructions. Use to check blood sugar 1 each 1  . diclofenac Sodium (VOLTAREN) 1 % GEL Apply 2 g topically 3 (three) times daily. 100 g 3  . glucagon 1 MG injection Inject 1 mg into the muscle once as needed for up to 1 dose. 1 each 12  . glucose blood (CONTOUR NEXT TEST) test strip Use as instructed to check 4 times daily 400 each 5  . insulin aspart (NOVOLOG) 100 UNIT/ML injection Sliding scale-no more than 50 units per day    . insulin degludec (TRESIBA FLEXTOUCH) 100 UNIT/ML FlexTouch Pen Inject 20 Units into the skin daily. 9 mL 3  . Insulin Pen Needle 32G X 4 MM MISC Use 4-5x a day 300 each 3  . memantine (NAMENDA) 5 MG tablet Take 1 tablet (5 mg total) by mouth 2 (two) times daily. 60 tablet 3  . metoprolol succinate (TOPROL-XL) 50 MG 24 hr tablet Take 50 mg by mouth at bedtime. Take with or immediately following a meal.    . nitroGLYCERIN (NITROSTAT) 0.4 MG SL tablet Place 1 tablet (0.4 mg total) under the tongue every 5 (five) minutes as needed for chest pain. 30 tablet 1  . pregabalin (LYRICA) 25 MG capsule Take 25 mg by mouth daily.     Marland Kitchen SYNTHROID 75 MCG tablet Take 1 tablet (75 mcg total) by mouth daily before breakfast. Don't take any on Sundays    . clopidogrel (PLAVIX) 75 MG tablet TAKE 1 TABLET(75 MG) BY MOUTH DAILY 30 tablet 6  . ezetimibe (ZETIA) 10 MG tablet Take 1 tablet (10 mg total) by mouth daily. 90 tablet 3  . losartan (COZAAR) 25 MG tablet Take 1 tablet (25 mg total) by mouth daily. (Patient taking differently: Take 25 mg by mouth in the morning and at bedtime.) 30 tablet 11  . rosuvastatin (CRESTOR) 10 MG tablet TAKE 1 TABLET(10 MG) BY MOUTH DAILY 30 tablet 5   No facility-administered medications prior to visit.     Per HPI unless specifically indicated in ROS section below Review of Systems  Constitutional: Negative for activity change, appetite change, chills, fatigue, fever and unexpected weight change.  HENT: Negative for hearing loss.   Eyes: Negative for  visual disturbance.  Respiratory: Positive for cough. Negative for chest tightness, shortness of breath and wheezing.   Cardiovascular: Positive for chest pain and palpitations (with exertion). Negative for leg swelling.  Gastrointestinal: Negative for abdominal distention, abdominal pain, blood in stool, constipation, diarrhea, nausea and vomiting.  Genitourinary: Negative for difficulty urinating and hematuria.  Musculoskeletal: Negative for arthralgias, myalgias and neck pain.  Skin: Negative for rash.  Neurological: Positive for headaches. Negative for dizziness, seizures and syncope.  Hematological: Negative for adenopathy. Does not bruise/bleed easily.  Psychiatric/Behavioral: Negative for dysphoric mood. The patient is not nervous/anxious.    Objective:  BP 134/62 (BP Location: Left Arm, Patient Position: Sitting, Cuff  Size: Normal)   Pulse 87   Temp (!) 97.5 F (36.4 C) (Temporal)   Ht 5' 3.5" (1.613 m)   Wt 160 lb 4 oz (72.7 kg)   SpO2 99%   BMI 27.94 kg/m   Wt Readings from Last 3 Encounters:  09/16/20 160 lb 4 oz (72.7 kg)  09/04/20 158 lb 6.4 oz (71.8 kg)  09/01/20 157 lb 8 oz (71.4 kg)      Physical Exam Vitals and nursing note reviewed.  Constitutional:      General: She is not in acute distress.    Appearance: Normal appearance. She is well-developed and well-nourished. She is not ill-appearing.  HENT:     Head: Normocephalic and atraumatic.     Right Ear: Hearing, tympanic membrane, ear canal and external ear normal.     Left Ear: Hearing, tympanic membrane, ear canal and external ear normal.     Mouth/Throat:     Lips: Pink.     Mouth: Oropharynx is clear and moist and mucous membranes are normal.     Pharynx: No posterior oropharyngeal edema.  Eyes:     General: No scleral icterus.    Extraocular Movements: Extraocular movements intact and EOM normal.     Conjunctiva/sclera: Conjunctivae normal.     Pupils: Pupils are equal, round, and reactive to light.   Neck:     Thyroid: No thyroid mass or thyromegaly.  Cardiovascular:     Rate and Rhythm: Normal rate and regular rhythm.     Pulses: Normal pulses and intact distal pulses.          Radial pulses are 2+ on the right side and 2+ on the left side.     Heart sounds: Normal heart sounds. No murmur heard.   Pulmonary:     Effort: Pulmonary effort is normal. No respiratory distress.     Breath sounds: Normal breath sounds. No wheezing, rhonchi or rales.  Abdominal:     General: Abdomen is flat. Bowel sounds are normal. There is no distension.     Palpations: Abdomen is soft. There is no mass.     Tenderness: There is no abdominal tenderness. There is no guarding or rebound.     Hernia: No hernia is present.  Musculoskeletal:        General: No edema. Normal range of motion.     Cervical back: Normal range of motion and neck supple.     Right lower leg: No edema.     Left lower leg: No edema.  Lymphadenopathy:     Cervical: No cervical adenopathy.  Skin:    General: Skin is warm and dry.     Findings: No rash.  Neurological:     General: No focal deficit present.     Mental Status: She is alert and oriented to person, place, and time.     Comments: CN grossly intact, station and gait intact  Psychiatric:        Mood and Affect: Mood and affect and mood normal.        Behavior: Behavior normal.        Thought Content: Thought content normal.        Judgment: Judgment normal.       Results for orders placed or performed in visit on 09/09/20  LDL Cholesterol, Direct  Result Value Ref Range   Direct LDL 113.0 mg/dL  T4, free  Result Value Ref Range   Free T4 0.71 0.60 - 1.60 ng/dL  Hepatitis  C antibody  Result Value Ref Range   Hepatitis C Ab NON-REACTIVE NON-REACTI   SIGNAL TO CUT-OFF 0.01 <1.00  VITAMIN D 25 Hydroxy (Vit-D Deficiency, Fractures)  Result Value Ref Range   VITD 35.41 30.00 - 100.00 ng/mL  TSH  Result Value Ref Range   TSH 2.16 0.35 - 4.50 uIU/mL   Comprehensive metabolic panel  Result Value Ref Range   Sodium 141 135 - 145 mEq/L   Potassium 3.9 3.5 - 5.1 mEq/L   Chloride 105 96 - 112 mEq/L   CO2 28 19 - 32 mEq/L   Glucose, Bld 223 (H) 70 - 99 mg/dL   BUN 9 6 - 23 mg/dL   Creatinine, Ser 0.93 0.40 - 1.20 mg/dL   Total Bilirubin 0.3 0.2 - 1.2 mg/dL   Alkaline Phosphatase 80 39 - 117 U/L   AST 16 0 - 37 U/L   ALT 15 0 - 35 U/L   Total Protein 6.2 6.0 - 8.3 g/dL   Albumin 3.9 3.5 - 5.2 g/dL   GFR 59.00 (L) >60.00 mL/min   Calcium 9.6 8.4 - 10.5 mg/dL   Assessment & Plan:  This visit occurred during the SARS-CoV-2 public health emergency.  Safety protocols were in place, including screening questions prior to the visit, additional usage of staff PPE, and extensive cleaning of exam room while observing appropriate contact time as indicated for disinfecting solutions.   Problem List Items Addressed This Visit    Vitamin D deficiency    Stable period on current oral vit D dose.       TIA (transient ischemic attack)    Continue aspirin, plavix, crestor.       Relevant Medications   losartan (COZAAR) 25 MG tablet   rosuvastatin (CRESTOR) 20 MG tablet   Sjogren's syndrome without extraglandular involvement ([name])    Encouraged ophtho f/u as due.       PAD (peripheral artery disease) (HCC)    Continue aspirin, statin, plavix.       Relevant Medications   losartan (COZAAR) 25 MG tablet   rosuvastatin (CRESTOR) 20 MG tablet   MCI (mild cognitive impairment)    Minimal. ?related to FM mental fog vs recent hyperglycemia.  Suggested trial off namenda x 1 month, monitor effect.       LADA (latent autoimmune diabetes in adults), managed as type 1 ([place])    Appreciate endo care. Improving levels with recent insulin titration      Relevant Medications   losartan (COZAAR) 25 MG tablet   rosuvastatin (CRESTOR) 20 MG tablet   Intractable episodic headache    Ongoing, followed by neurology.       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 Appreciate endo care.       Hyperlipidemia associated with type 2 diabetes mellitus ([place])    Reviewed with patient. Did not tolerate recent ezetimibe trial. Suggested trial crestor 65m daily, update with effect.  Goal LDL <100 given DM hx, ideally <70 given TIA hx The 10-year ASCVD risk score (Mikey BussingDC Jr., et al., 2013) is: 50.6%   Values used to calculate the score:     Age: [age]years     Sex: Female     Is Non-Hispanic African American: No     Diabetic: Yes     Tobacco smoker: No     Systolic Blood Pressure: 1641mmHg     Is BP treated: Yes     HDL Cholesterol: 53.5 mg/dL  Total Cholesterol: 183 mg/dL       Relevant Medications   losartan (COZAAR) 25 MG tablet   rosuvastatin (CRESTOR) 20 MG tablet   Health maintenance examination - Primary    Preventative protocols reviewed and updated unless pt declined. Discussed healthy diet and lifestyle.       Fibromyalgia   Essential hypertension    Stable period on current regimen - continue.       Relevant Medications   losartan (COZAAR) 25 MG tablet   rosuvastatin (CRESTOR) 20 MG tablet   Chronic venous insufficiency    Regularly using compression stockings with benefit.       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 S/p LICA stent placed [id number].       Relevant Medications   losartan (COZAAR) 25 MG tablet   rosuvastatin (CRESTOR) 20 MG tablet       Meds ordered this encounter  Medications  . DISCONTD: rosuvastatin (CRESTOR) 10 MG tablet    Sig: Take 1 tablet (10 mg total) by mouth daily.    Dispense:  90 tablet    Refill:  3  . losartan (COZAAR) 25 MG tablet    Sig: Take 1 tablet (25 mg total) by mouth daily.    Dispense:  90 tablet    Refill:  3  . Cholecalciferol (VITAMIN D3) 25 MCG (1000 UT) CAPS    Sig: Take 1 capsule (1,000 Units  total) by mouth daily.    Dispense:  30 capsule  . rosuvastatin (CRESTOR) 20 MG tablet    Sig: Take 1 tablet (20 mg total) by mouth daily.    Dispense:  90 tablet    Refill:  3    Use this dose (note change)   No orders of the defined types were placed in this encounter.   Patient instructions: Try out higher crestor dose 70m daily - take at night time.  Monitor for muscle aches on higher dose.  Goal LDL (bad cholesterol) <100, ideally <70.  Continue daily walking. Return as needed or in 6 months for follow up visit.   Follow up plan: Return in about 6 months (around 03/17/2021) for follow up visit.  JRia Bush MD

## 2020-09-16 NOTE — Assessment & Plan Note (Signed)
Appreciate [facility] care. S/p LICA stent placed [id number].

## 2020-09-16 NOTE — Assessment & Plan Note (Signed)
Preventative protocols reviewed and updated unless pt declined. Discussed healthy diet and lifestyle.  

## 2020-09-21 ENCOUNTER — Telehealth: Payer: Self-pay | Admitting: Family Medicine

## 2020-09-21 NOTE — Chronic Care Management (AMB) (Signed)
  Chronic Care Management   Note  09/21/2020 Name: Cassandra Benson MRN: [id number] DOB: 08-30-1941  Cassandra Benson is a 79 y.o. year old female who is a primary care patient of [name], MD. I reached out to [name] by phone today in response to a referral sent by Cassandra Benson's PCP, [name], MD.   Cassandra Benson was given information about Chronic Care Management services today including:  1. CCM service includes personalized support from designated clinical staff supervised by her physician, including individualized plan of care and coordination with other care providers 2. 24/7 contact phone numbers for assistance for urgent and routine care needs. 3. Service will only be billed when office clinical staff spend 20 minutes or more in a month to coordinate care. 4. Only one practitioner may furnish and bill the service in a calendar month. 5. The patient may stop CCM services at any time (effective at the end of the month) by phone call to the office staff.   Patient agreed to services and verbal consent obtained.   Follow up plan:   [place]

## 2020-09-30 DIAGNOSIS — R208 Other disturbances of skin sensation: Secondary | ICD-10-CM | POA: Diagnosis not present

## 2020-09-30 DIAGNOSIS — R202 Paresthesia of skin: Secondary | ICD-10-CM | POA: Diagnosis not present

## 2020-09-30 DIAGNOSIS — H547 Unspecified visual loss: Secondary | ICD-10-CM | POA: Diagnosis not present

## 2020-09-30 DIAGNOSIS — M329 Systemic lupus erythematosus, unspecified: Secondary | ICD-10-CM | POA: Diagnosis not present

## 2020-09-30 DIAGNOSIS — R413 Other amnesia: Secondary | ICD-10-CM | POA: Diagnosis not present

## 2020-09-30 DIAGNOSIS — R519 Headache, unspecified: Secondary | ICD-10-CM | POA: Diagnosis not present

## 2020-09-30 DIAGNOSIS — H53149 Visual discomfort, unspecified: Secondary | ICD-10-CM | POA: Diagnosis not present

## 2020-09-30 DIAGNOSIS — F40298 Other specified phobia: Secondary | ICD-10-CM | POA: Diagnosis not present

## 2020-10-07 ENCOUNTER — Other Ambulatory Visit: Payer: Self-pay

## 2020-10-07 ENCOUNTER — Encounter: Payer: Self-pay | Admitting: Family Medicine

## 2020-10-07 ENCOUNTER — Telehealth: Payer: Self-pay | Admitting: Internal Medicine

## 2020-10-07 ENCOUNTER — Ambulatory Visit (INDEPENDENT_AMBULATORY_CARE_PROVIDER_SITE_OTHER): Payer: HMO | Admitting: Family Medicine

## 2020-10-07 VITALS — BP 150/70 | HR 71 | Temp 97.3°F | Ht 63.0 in | Wt 159.7 lb

## 2020-10-07 DIAGNOSIS — G3184 Mild cognitive impairment, so stated: Secondary | ICD-10-CM | POA: Diagnosis not present

## 2020-10-07 DIAGNOSIS — R202 Paresthesia of skin: Secondary | ICD-10-CM | POA: Diagnosis not present

## 2020-10-07 DIAGNOSIS — M797 Fibromyalgia: Secondary | ICD-10-CM | POA: Diagnosis not present

## 2020-10-07 DIAGNOSIS — R519 Headache, unspecified: Secondary | ICD-10-CM

## 2020-10-07 DIAGNOSIS — I10 Essential (primary) hypertension: Secondary | ICD-10-CM | POA: Diagnosis not present

## 2020-10-07 NOTE — Progress Notes (Signed)
Patient ID: Cassandra Benson, female    DOB: 12/17/1940, 80 y.o.   MRN: [id number]  This visit was conducted in person.  BP (!) 150/70 (BP Location: Right Arm, Patient Position: Sitting, Cuff Size: Normal)   Pulse 71   Temp (!) 97.3 F (36.3 C) (Temporal)   Ht '5\' 3"'  (1.6 m)   Wt 159 lb 11.2 oz (72.4 kg)   SpO2 97%   BMI 28.29 kg/m   150/64 on repeat testing.   CC: f/u visit  Subjective:   HPI: Cassandra Benson is a 80 y.o. female presenting on 10/07/2020 for Headache (Pt has some concerns about a new medication that her neur wants her to start taking )   Saw neurology last week, note reviewed. Pt states there's ongoing concern for temporal arteritis due to chronic R sided headache. She did have negative biopsy 2019. Never left-sided headache. Planning to start effexor for headaches, discussed possible clonazepam in the future. Planning to return to see neuro in follow up 11/2020.   Ongoing R facial numbness/paresthesias - continues lyrica (did not tolerate gabapentin).  Stays in pain daily 8/10 - attributed to FM.   HTN - has not been checking BP at home. bp elevated today despite taking her normal losartan 69m daily and metoprolol XL 534mdaily.   She has stopped artifical sweeteners (sweet and low) for the past few months in the hopes to improve carotid plaque buildup.   Pt has ongoing concern over memory. ?MCI. Daughter to start paying bills for her. She did not trial off namenda as she feels it's beneficial. Discussed this.   Finds 1/2 tramadol at night helps her sleep. Still has previous Rx from #15.  States she stopped aspirin when she started plavix after carotid surgery - will remove from med list.      Relevant past medical, surgical, family and social history reviewed and updated as indicated. Interim medical history since our last visit reviewed. Allergies and medications reviewed and updated. Outpatient Medications Prior to Visit  Medication Sig Dispense Refill   . acetaminophen (TYLENOL) 500 MG tablet Take 1 tablet (500 mg total) by mouth 3 (three) times daily as needed.    . Ascorbic Acid (VITAMIN C) 100 MG tablet Take 100 mg by mouth daily.    . BD ULTRA-FINE LANCETS lancets Use as instructed upto 6 times daily 600 each 2  . benzonatate (TESSALON) 100 MG capsule Take 1 capsule (100 mg total) by mouth 3 (three) times daily as needed for cough. 30 capsule 3  . Cholecalciferol (VITAMIN D3) 25 MCG (1000 UT) CAPS Take 1 capsule (1,000 Units total) by mouth daily. 30 capsule   . clopidogrel (PLAVIX) 75 MG tablet TAKE 1 TABLET(75 MG) BY MOUTH DAILY 30 tablet 6  . Continuous Blood Gluc Transmit (DEXCOM G6 TRANSMITTER) MISC 1 kit by Does not apply route See admin instructions. Use to check blood sugar 1 each 1  . diclofenac Sodium (VOLTAREN) 1 % GEL Apply 2 g topically 3 (three) times daily. 100 g 3  . glucagon 1 MG injection Inject 1 mg into the muscle once as needed for up to 1 dose. 1 each 12  . glucose blood (CONTOUR NEXT TEST) test strip Use as instructed to check 4 times daily 400 each 5  . insulin aspart (NOVOLOG) 100 UNIT/ML injection Sliding scale-no more than 50 units per day    . insulin degludec (TRESIBA FLEXTOUCH) 100 UNIT/ML FlexTouch Pen Inject 20 Units into the skin  daily. 9 mL 3  . Insulin Pen Needle 32G X 4 MM MISC Use 4-5x a day 300 each 3  . losartan (COZAAR) 25 MG tablet Take 1 tablet (25 mg total) by mouth daily. 90 tablet 3  . memantine (NAMENDA) 5 MG tablet Take 1 tablet (5 mg total) by mouth 2 (two) times daily. 60 tablet 3  . metoprolol succinate (TOPROL-XL) 50 MG 24 hr tablet Take 50 mg by mouth at bedtime. Take with or immediately following a meal.    . nitroGLYCERIN (NITROSTAT) 0.4 MG SL tablet Place 1 tablet (0.4 mg total) under the tongue every 5 (five) minutes as needed for chest pain. 30 tablet 1  . pregabalin (LYRICA) 25 MG capsule Take 25 mg by mouth daily.     . rosuvastatin (CRESTOR) 20 MG tablet Take 1 tablet (20 mg total)  by mouth daily. 90 tablet 3  . SYNTHROID 75 MCG tablet Take 1 tablet (75 mcg total) by mouth daily before breakfast. Don't take any on Sundays    . aspirin EC 81 MG tablet Take 81 mg by mouth daily.     No facility-administered medications prior to visit.     Per HPI unless specifically indicated in ROS section below Review of Systems Objective:  BP (!) 150/70 (BP Location: Right Arm, Patient Position: Sitting, Cuff Size: Normal)   Pulse 71   Temp (!) 97.3 F (36.3 C) (Temporal)   Ht '5\' 3"'  (1.6 m)   Wt 159 lb 11.2 oz (72.4 kg)   SpO2 97%   BMI 28.29 kg/m   Wt Readings from Last 3 Encounters:  10/07/20 159 lb 11.2 oz (72.4 kg)  09/16/20 160 lb 4 oz (72.7 kg)  09/04/20 158 lb 6.4 oz (71.8 kg)      Physical Exam Vitals and nursing note reviewed.  Constitutional:      Appearance: Normal appearance. She is not ill-appearing.  HENT:     Head: Normocephalic and atraumatic.  Eyes:     Extraocular Movements: Extraocular movements intact.     Pupils: Pupils are equal, round, and reactive to light.  Cardiovascular:     Rate and Rhythm: Normal rate and regular rhythm.     Pulses: Normal pulses.     Heart sounds: Normal heart sounds. No murmur heard.   Pulmonary:     Effort: Pulmonary effort is normal. No respiratory distress.     Breath sounds: Normal breath sounds. No wheezing, rhonchi or rales.  Musculoskeletal:     Right lower leg: No edema.     Left lower leg: No edema.  Skin:    General: Skin is warm and dry.  Neurological:     Mental Status: She is alert.       Results for orders placed or performed in visit on 09/09/20  LDL Cholesterol, Direct  Result Value Ref Range   Direct LDL 113.0 mg/dL  T4, free  Result Value Ref Range   Free T4 0.71 0.60 - 1.60 ng/dL  Hepatitis C antibody  Result Value Ref Range   Hepatitis C Ab NON-REACTIVE NON-REACTI   SIGNAL TO CUT-OFF 0.01 <1.00  VITAMIN D 25 Hydroxy (Vit-D Deficiency, Fractures)  Result Value Ref Range   VITD  35.41 30.00 - 100.00 ng/mL  TSH  Result Value Ref Range   TSH 2.16 0.35 - 4.50 uIU/mL  Comprehensive metabolic panel  Result Value Ref Range   Sodium 141 135 - 145 mEq/L   Potassium 3.9 3.5 - 5.1 mEq/L  Chloride 105 96 - 112 mEq/L   CO2 28 19 - 32 mEq/L   Glucose, Bld 223 (H) 70 - 99 mg/dL   BUN 9 6 - 23 mg/dL   Creatinine, Ser 0.93 0.40 - 1.20 mg/dL   Total Bilirubin 0.3 0.2 - 1.2 mg/dL   Alkaline Phosphatase 80 39 - 117 U/L   AST 16 0 - 37 U/L   ALT 15 0 - 35 U/L   Total Protein 6.2 6.0 - 8.3 g/dL   Albumin 3.9 3.5 - 5.2 g/dL   GFR 59.00 (L) >60.00 mL/min   Calcium 9.6 8.4 - 10.5 mg/dL   Assessment & Plan:  This visit occurred during the SARS-CoV-2 public health emergency.  Safety protocols were in place, including screening questions prior to the visit, additional usage of staff PPE, and extensive cleaning of exam room while observing appropriate contact time as indicated for disinfecting solutions.   Problem List Items Addressed This Visit    MCI (mild cognitive impairment)    Question of this diagnosis vs mental fogginess from FM.  Discussed trial off namenda and monitor effect.       Intractable episodic headache - Primary    Appreciate neurology care.  She will trial effexor. Discussed coupons through [employer].  Agree with staying off artifical sweeteners at this time.       Fibromyalgia    Continue low dose lyrica.       Facial paresthesia    Continue vascular and neuro f/u.       Essential hypertension    Chronic, mildly elevated in office. Advised to start checking BP at home and let me know if persistently elevated to consider increased losartan dose.           No orders of the defined types were placed in this encounter.  No orders of the defined types were placed in this encounter.   Patient Instructions  Consider trial off namenda I agree with trying effexor for headaches. Keep follow up with Dr Melrose Nakayama.  I agree with staying off artifical  sweeteners.  Tylenol is ok to use. Avoid anti inflammatories like ibuprofen, aleve, advil.  Blood pressures were a bit high today - start monitoring at home. Goal <140/90. Update me with readings after 1-2 weeks.   Follow up plan: Return if symptoms worsen or fail to improve.  [name], MD

## 2020-10-07 NOTE — Assessment & Plan Note (Addendum)
Question of this diagnosis vs mental fogginess from FM.  Discussed trial off namenda and monitor effect.

## 2020-10-07 NOTE — Assessment & Plan Note (Signed)
Chronic, mildly elevated in office. Advised to start checking BP at home and let me know if persistently elevated to consider increased losartan dose.

## 2020-10-07 NOTE — Assessment & Plan Note (Addendum)
Appreciate neurology care.  She will trial effexor. Discussed coupons through [employer].  Agree with staying off artifical sweeteners at this time.

## 2020-10-07 NOTE — Assessment & Plan Note (Signed)
Continue vascular and neuro f/u.

## 2020-10-07 NOTE — Assessment & Plan Note (Deleted)
She continues crestor 20mg  daily and tolerating well.

## 2020-10-07 NOTE — Patient Instructions (Addendum)
Consider trial off namenda I agree with trying effexor for headaches. Keep follow up with Dr Melrose Nakayama.  I agree with staying off artifical sweeteners.  Tylenol is ok to use. Avoid anti inflammatories like ibuprofen, aleve, advil.  Blood pressures were a bit high today - start monitoring at home. Goal <140/90. Update me with readings after 1-2 weeks.

## 2020-10-07 NOTE — Assessment & Plan Note (Signed)
Continue low dose lyrica.

## 2020-10-07 NOTE — Telephone Encounter (Signed)
REFILL REQUEST FOR:  One touch ultra test strips (patient only has one left)  PHARMACY  [id number] - [place] [address], [place] [address]  [address] [place], [place] [address]  Phone:  [phone] Fax:  [phone]

## 2020-10-08 MED ORDER — GLUCOSE BLOOD VI STRP: ORAL_STRIP | 3 refills | Status: DC

## 2020-10-08 NOTE — Telephone Encounter (Signed)
Erx sent as requested.  

## 2020-10-15 ENCOUNTER — Telehealth: Payer: Self-pay

## 2020-10-15 ENCOUNTER — Encounter: Payer: Self-pay | Admitting: Family Medicine

## 2020-10-15 NOTE — Chronic Care Management (AMB) (Addendum)
Chronic Care Management Pharmacy Assistant   Name: Cassandra Benson  MRN: [id number] DOB: 1940-12-07  Reason for Encounter: Initial Questions for CCM Visit 10/20/20  PCP : [name], MD  Allergies:   Allergies  Allergen Reactions   Iodinated Diagnostic Agents Other (See Comments)    Itching (severe) and chest tightness   Penicillins Anaphylaxis, Swelling, Rash and Other (See Comments)    Has patient had a PCN reaction causing immediate rash, facial/tongue/throat swelling, SOB or lightheadedness with hypotension: Yes Has patient had a PCN reaction causing severe rash involving mucus membranes or skin necrosis: Unknown Has patient had a PCN reaction that required hospitalization: Unknown Has patient had a PCN reaction occurring within the last 10 years: No If all of the above answers are "NO", then may proceed with Cephalosporin use.    Amlodipine Swelling    Pedal edema   Anectine [Succinylcholine] Other (See Comments)    Brother went into cardiac arrest.   Codeine Nausea Only   Gabapentin Other (See Comments)    Gait abnormality   Influenza Vaccines Other (See Comments)    Muscle weakness; unable to walk   Nortriptyline Other (See Comments)    Eye swelling and mouth drawed up   Pamelor [Nortriptyline Hcl] Other (See Comments)    Patient states caused her face to draw in together.   Valsartan Other (See Comments) and Cough    Allergy to generic only, "Hacking" cough   Zetia [Ezetimibe] Other (See Comments)    Bad muscle cramps   Erythromycin Rash and Swelling   Sulfa Antibiotics Rash    Medications: Outpatient Encounter Medications as of 10/15/2020  Medication Sig   acetaminophen (TYLENOL) 500 MG tablet Take 1 tablet (500 mg total) by mouth 3 (three) times daily as needed.   Ascorbic Acid (VITAMIN C) 100 MG tablet Take 100 mg by mouth daily.   BD ULTRA-FINE LANCETS lancets Use as instructed upto 6 times daily   benzonatate (TESSALON) 100 MG capsule Take 1  capsule (100 mg total) by mouth 3 (three) times daily as needed for cough.   Cholecalciferol (VITAMIN D3) 25 MCG (1000 UT) CAPS Take 1 capsule (1,000 Units total) by mouth daily.   clopidogrel (PLAVIX) 75 MG tablet TAKE 1 TABLET(75 MG) BY MOUTH DAILY   Continuous Blood Gluc Transmit (DEXCOM G6 TRANSMITTER) MISC 1 kit by Does not apply route See admin instructions. Use to check blood sugar   diclofenac Sodium (VOLTAREN) 1 % GEL Apply 2 g topically 3 (three) times daily.   glucagon 1 MG injection Inject 1 mg into the muscle once as needed for up to 1 dose.   glucose blood test strip Use as instructed to check glucose 4 times daily.   insulin aspart (NOVOLOG) 100 UNIT/ML injection Sliding scale-no more than 50 units per day   insulin degludec (TRESIBA FLEXTOUCH) 100 UNIT/ML FlexTouch Pen Inject 20 Units into the skin daily.   Insulin Pen Needle 32G X 4 MM MISC Use 4-5x a day   losartan (COZAAR) 25 MG tablet Take 1 tablet (25 mg total) by mouth daily.   memantine (NAMENDA) 5 MG tablet Take 1 tablet (5 mg total) by mouth 2 (two) times daily.   metoprolol succinate (TOPROL-XL) 50 MG 24 hr tablet Take 50 mg by mouth at bedtime. Take with or immediately following a meal.   nitroGLYCERIN (NITROSTAT) 0.4 MG SL tablet Place 1 tablet (0.4 mg total) under the tongue every 5 (five) minutes as needed for chest pain.  pregabalin (LYRICA) 25 MG capsule Take 25 mg by mouth daily.    rosuvastatin (CRESTOR) 20 MG tablet Take 1 tablet (20 mg total) by mouth daily.   SYNTHROID 75 MCG tablet Take 1 tablet (75 mcg total) by mouth daily before breakfast. Don't take any on Sundays   No facility-administered encounter medications on file as of 10/15/2020.    Current Diagnosis: Patient Active Problem List   Diagnosis Date Noted   Cause of injury, fall 09/01/2020   Grade II diastolic dysfunction 16/38/4536   Chronic venous insufficiency 04/07/2020   Bowel habit changes 02/18/2020   Diverticulosis    Partial small  bowel obstruction ([place]) 01/15/2020   Epistaxis 01/13/2020   Cough 01/05/2020   MCI (mild cognitive impairment) 10/22/2019   Right sided abdominal pain 10/07/2019   Skin lesion 09/14/2019   Renal insufficiency 09/14/2019   Pain in right buttock 06/18/2019   Chronic midline thoracic back pain 05/14/2019   Sjogren's syndrome without extraglandular involvement ([place]) 02/26/2019   Epigastric discomfort 12/17/2018   PAD (peripheral artery disease) ([place]) 10/04/2018   Raynaud disease 09/04/2018   Amaurosis fugax, both eyes 06/18/2018   TIA (transient ischemic attack) 05/21/2018   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   Facial paresthesia 02/07/2018   Pain and swelling of lower leg 12/25/2017   Fibromyalgia    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), bilateral 08/21/2017   Intractable episodic headache 03/13/2017   Numbness and tingling of both lower extremities 03/13/2017   6th nerve palsy, 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   Fatty liver 02/13/2017   LADA (latent autoimmune diabetes in adults), 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   Medicare annual wellness visit, subsequent 06/19/2015   Health maintenance examination 06/19/2015   Advanced care planning/counseling discussion 06/19/2015   Carotid stenosis, 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 06/19/2015   Vitamin D deficiency    Family history of premature CAD 06/02/2015   Chest pain 05/26/2015   Elevated testosterone level in female 09/04/2014   Hyperlipidemia associated with type 2 diabetes mellitus ([place]) 07/30/2014   Essential hypertension 07/17/2014   Hypothyroidism due to Hashimoto's thyroiditis 07/17/2014   Adjustment disorder with mixed anxiety and depressed mood 07/17/2014   Apnea, sleep 08/22/2012   LBP (low back pain) 02/27/2012   Positive ANA (antinuclear antibody) 01/06/2012    Have you seen any other providers since your last visit?  Has not seen any other providers since last seeing Dr. Danise Mina 10/07/20.    Any changes in your  medications or health? Dr. Melrose Nakayama started patient on venlafaxine 09/30/20 but patient has not started due to concern for drug interactions  Any side effects from any medications? No  Do you have an symptoms or problems not managed by your medications? Headaches  Any concerns about your health right now? Daily headaches.  Has your provider asked that you check blood pressure, blood sugar, or follow special diet at home? Yes, following diabetic diet. Check blood pressure and blood sugar daily   Do you get any type of exercise on a regular basis? Tries to be very active.  Can you think of a goal you would like to reach for your health? Weight loss.   Do you have any problems getting your medications? States she does not qualify for any patient assistance. [facility]ses [facility]- denies any issues.  Is there anything that you would like to discuss during the appointment? Daily headaches- states she had a sister that passed away from an aneurysm. Concerned this could happen to her.    She states  she is going to have Dr. Cruzita Lederer write her a prescription for Dexcom. She had an insulin pump that did not work correctly so she stopped using.   Patient reminded to have all medications, supplements and any blood sugar and blood pressure readings available for review with Debbora Dus, Pharm. D, at their telephone visit on 10/20/20.    Follow-Up:  Pharmacist Review   Debbora Dus, CPP notified  Margaretmary Dys, [facility] Assistant [phone]

## 2020-10-20 ENCOUNTER — Other Ambulatory Visit: Payer: Self-pay

## 2020-10-20 ENCOUNTER — Ambulatory Visit: Payer: HMO

## 2020-10-20 DIAGNOSIS — I10 Essential (primary) hypertension: Secondary | ICD-10-CM

## 2020-10-20 DIAGNOSIS — E1169 Type 2 diabetes mellitus with other specified complication: Secondary | ICD-10-CM

## 2020-10-20 NOTE — Chronic Care Management (AMB) (Signed)
Chronic Care Management Pharmacy  Name: Cassandra Benson  MRN: [id number] DOB: [birth year]   Initial Questions from [facility] 10/15/20 reviewed.  Chief Complaint/ HPI  Cassandra Benson,  80 y.o. , female presents for their Initial CCM visit with the clinical pharmacist via telephone.  PCP : [name], MD  Their chronic conditions include: HTN, TIA, PAD, sleep apnea, fatty liver, hypothyroidism, HLD, type 1 DM, renal insufficiency, vitamin D deficiency, Sjogren's, MCI, fibromyalgia  CCM consent 09/21/20  Patient concerns: Biggest concerns is headaches at this time. Severe pain from headaches 24 hour/day.   Office Visits:  10/15/20: Patient sent BP records by MyChart, Dr. Danise Mina reviewed, BP looks good   10/07/20: PCP visit - Consider trial off Namenda. I agree with trying effexor for headaches. Keep follow up with Dr Melrose Nakayama.  I agree with staying off artifical sweeteners. Tylenol is ok to use. Avoid anti inflammatories like ibuprofen, aleve, advil. Blood pressures were a bit high today - start monitoring at home. Goal <140/90. Update me with readings after 1-2 weeks. Aspirin discontinued.   09/16/20: PCP visit - Stable on current vitamin D dose, TIA continue aspirin, Plavix, Crestor. HLD did not tolerate recent Zetia trial. Suggest Crestor 20 mg trial (dose increase).   Consult Visit:  09/30/20: Neurology - Headaches Start Effexor 37.5 mg daily for 1 week, then increase to 37.5 mg twice a day. Pick this up from your pharmacy.   09/04/20: Endocrinology - LADA and Hashimotos - Pt stopped insulin pump and is only on NovoLog without basal insulin. Sugars extremely high. Start Tresiba 20 units daily. HLD On Crestor 10-continues to have muscle cramps despite using CoQ10.  She also takes [employer].  Praluent was not covered. Hasimotos, She is currently on Synthroid 75 mcg 6 out of 7 days  08/04/20: Cardiology - Start Zetia 10 mg daily  Procedures: Known carotid stenosis s/p L  transcarotid revascularization 06/2018 (Dr Carlis Abbott)   Allergies  Allergen Reactions  . Iodinated Diagnostic Agents Other (See Comments)    Itching (severe) and chest tightness  . Penicillins Anaphylaxis, Swelling, Rash and Other (See Comments)    Has patient had a PCN reaction causing immediate rash, facial/tongue/throat swelling, SOB or lightheadedness with hypotension: Yes Has patient had a PCN reaction causing severe rash involving mucus membranes or skin necrosis: Unknown Has patient had a PCN reaction that required hospitalization: Unknown Has patient had a PCN reaction occurring within the last 10 years: No If all of the above answers are "NO", then may proceed with Cephalosporin use.   . Amlodipine Swelling    Pedal edema  . Anectine [Succinylcholine] Other (See Comments)    Brother went into cardiac arrest.  . Codeine Nausea Only  . Gabapentin Other (See Comments)    Gait abnormality  . Influenza Vaccines Other (See Comments)    Muscle weakness; unable to walk  . Nortriptyline Other (See Comments)    Eye swelling and mouth drawed up  . Pamelor [Nortriptyline Hcl] Other (See Comments)    Patient states caused her face to draw in together.  . Valsartan Other (See Comments) and Cough    Allergy to generic only, "Hacking" cough  . Zetia [Ezetimibe] Other (See Comments)    Bad muscle cramps  . Erythromycin Rash and Swelling  . Sulfa Antibiotics Rash   Medications: Outpatient Encounter Medications as of 10/20/2020  Medication Sig  . acetaminophen (TYLENOL) 500 MG tablet Take 1 tablet (500 mg total) by mouth 3 (three) times daily as  needed.  . Ascorbic Acid (VITAMIN C) 100 MG tablet Take 100 mg by mouth daily.  . BD ULTRA-FINE LANCETS lancets Use as instructed upto 6 times daily  . benzonatate (TESSALON) 100 MG capsule Take 1 capsule (100 mg total) by mouth 3 (three) times daily as needed for cough.  . Cholecalciferol (VITAMIN D3) 25 MCG (1000 UT) CAPS Take 1 capsule (1,000  Units total) by mouth daily.  . clopidogrel (PLAVIX) 75 MG tablet TAKE 1 TABLET(75 MG) BY MOUTH DAILY  . Continuous Blood Gluc Transmit (DEXCOM G6 TRANSMITTER) MISC 1 kit by Does not apply route See admin instructions. Use to check blood sugar  . diclofenac Sodium (VOLTAREN) 1 % GEL Apply 2 g topically 3 (three) times daily.  Marland Kitchen glucagon 1 MG injection Inject 1 mg into the muscle once as needed for up to 1 dose.  Marland Kitchen glucose blood test strip Use as instructed to check glucose 4 times daily.  . insulin aspart (NOVOLOG) 100 UNIT/ML injection Sliding scale-no more than 50 units per day  . insulin degludec (TRESIBA FLEXTOUCH) 100 UNIT/ML FlexTouch Pen Inject 20 Units into the skin daily.  . Insulin Pen Needle 32G X 4 MM MISC Use 4-5x a day  . losartan (COZAAR) 25 MG tablet Take 1 tablet (25 mg total) by mouth daily.  . metoprolol succinate (TOPROL-XL) 50 MG 24 hr tablet Take 50 mg by mouth at bedtime. Take with or immediately following a meal.  . nitroGLYCERIN (NITROSTAT) 0.4 MG SL tablet Place 1 tablet (0.4 mg total) under the tongue every 5 (five) minutes as needed for chest pain.  . pregabalin (LYRICA) 25 MG capsule Take 25 mg by mouth daily.   . rosuvastatin (CRESTOR) 20 MG tablet Take 1 tablet (20 mg total) by mouth daily.  Marland Kitchen SYNTHROID 75 MCG tablet Take 1 tablet (75 mcg total) by mouth daily before breakfast. Don't take any on Sundays  . venlafaxine (EFFEXOR) 37.5 MG tablet Take 37.5 mg once a day for a week then increase to 37.5 mg twice a day and continue that dose  . [DISCONTINUED] memantine (NAMENDA) 5 MG tablet Take 1 tablet (5 mg total) by mouth 2 (two) times daily.   No facility-administered encounter medications on file as of 10/20/2020.   Current Diagnosis/Assessment:  SDOH Interventions   Flowsheet Row Most Recent Value  SDOH Interventions   Financial Strain Interventions Intervention Not Indicated  [Meds affordable]      Goals Addressed            This Visit's Progress   .  Pharmacy Care Plan       CARE PLAN ENTRY (see longitudinal plan of care for additional care plan information)  Current Barriers:  . Chronic Disease Management support, education, and care coordination needs related to Hypertension and Hyperlipidemia   Hypertension BP Readings from Last 3 Encounters:  10/07/20 (!) 150/70  09/16/20 134/62  09/04/20 110/70   . Pharmacist Clinical Goal(s): o Over the next 6 months, patient will work with PharmD and providers to achieve BP goal <140/90 . Current regimen:   Metoprolol succinate 50 mg - 1 daily at bedtime   Losartan 25 mg - 1 daily . Interventions: o Recommend continue to monitor daily if starting venlafaxine o Reviewed home BP log . Patient self care activities - Over the next 6 months, patient will: o Check BP daily, document, and provide at future appointments o Ensure daily salt intake < 2300 mg/day  Hyperlipidemia Lab Results  Component Value Date/Time  [place] 95 07/08/2020 04:46 PM   LDLCALC 160 01/11/2016 12:00 AM   LDLDIRECT 113.0 09/09/2020 07:40 AM   . Pharmacist Clinical Goal(s): o Over the next 6 months, patient will work with PharmD and providers to achieve LDL goal < 100 (ideally < 70) . Current regimen:  o Rosuvastatin 20 mg - 1 daily . Interventions: o Recommend updating lipid panel  . Patient self care activities - Over the next 6 months, patient will: o Continue medication as prescribed  Initial goal documentation       Hypertension   CMP Latest Ref Rng & Units 09/09/2020 07/16/2020 03/05/2020  Glucose 70 - 99 mg/dL 223(H) 270(H) 102(H)  BUN 6 - 23 mg/dL '9 13 10  ' Creatinine 0.40 - 1.20 mg/dL 0.93 0.95 0.93  Sodium 135 - 145 mEq/L 141 138 145  Potassium 3.5 - 5.1 mEq/L 3.9 4.4 4.3  Chloride 96 - 112 mEq/L 105 102 112(H)  CO2 19 - 32 mEq/L '28 24 24  ' Calcium 8.4 - 10.5 mg/dL 9.6 9.7 9.7  Total Protein 6.0 - 8.3 g/dL 6.2 - -  Total Bilirubin 0.2 - 1.2 mg/dL 0.3 - -  Alkaline Phos 39 - 117 U/L 80 -  -  AST 0 - 37 U/L 16 - -  ALT 0 - 35 U/L 15 - -   Office blood pressures are: BP Readings from Last 3 Encounters:  10/07/20 (!) 150/70  09/16/20 134/62  09/04/20 110/70   BP goal < 140/90 mmHg   Patient has failed these meds in the past: none reported  Patient is currently controlled on the following medications:   Metoprolol succinate 50 mg - 1 daily at bedtime   Losartan 25 mg - 1 daily  10/20/20: Patient checking BP daily. 121/50, 70 this morning. Patient recently reported BP records to PCP. All within goal.  Plan: Continue current medications  Hyperlipidemia   LDL goal <70  Last lipids Lab Results  Component Value Date   CHOL 183 07/08/2020   HDL 53.50 07/08/2020   LDLCALC 95 07/08/2020   LDLDIRECT 113.0 09/09/2020   TRIG 175.0 (H) 07/08/2020   CHOLHDL 3 07/08/2020   Hepatic Function Latest Ref Rng & Units 09/09/2020 01/14/2020 10/07/2019  Total Protein 6.0 - 8.3 g/dL 6.2 6.8 6.9  Albumin 3.5 - 5.2 g/dL 3.9 3.8 4.1  AST 0 - 37 U/L '16 17 13  ' ALT 0 - 35 U/L '15 12 11  ' Alk Phosphatase 39 - 117 U/L 80 68 70  Total Bilirubin 0.2 - 1.2 mg/dL 0.3 0.7 0.4    The 10-year ASCVD risk score Mikey Bussing DC Jr., et al., 2013) is: 58.7%   Values used to calculate the score:     Age: 77 years     Sex: Female     Is Non-Hispanic African American: No     Diabetic: Yes     Tobacco smoker: No     Systolic Blood Pressure: 704 mmHg     Is BP treated: Yes     HDL Cholesterol: 53.5 mg/dL     Total Cholesterol: 183 mg/dL   Patient has failed these meds in past: Zetia - did not tolerate Patient is currently uncontrolled on the following medications:  . Rosuvastatin 20 mg - 1 daily  10/20/20: Lipids have not been updated since patient increased statin from 10 to 20 mg daily. Patient reports tolerating higher dose okay. She has noticed some cramps in hands with increase. Would avoid further dose increase due to drug interaction  with Plavix which may increase rosuvastatin concentrations. No  longer using sweet in low which has reduced plaque build up.   Plan: Continue current medications; Recommend updating lipid panel with next labs.   Diabetes   Recent Relevant Labs: Lab Results  Component Value Date/Time   HGBA1C 12.0 (A) 09/04/2020 02:58 PM   HGBA1C 9.3 (A) 05/25/2020 03:09 PM   HGBA1C 8.4 (H) 01/15/2020 05:01 AM   HGBA1C 9.7 (H) 09/10/2019 07:49 AM   MICROALBUR 0.1 10/23/2014 02:31 PM   MICROALBUR 0.8 07/17/2014 09:49 AM    Checking BG: Daily (in the morning), normally 4 times a day  Fasting BG: 214 (today), 174 (yesterday) Using regular meter right now because she needs a new prescription for Dexcom supplies (sensors) One Touch Ultra Blue Meter   Patient is currently uncontrolled on the following medications:  Tresiba 20 units in AM (if sugars remain high after starting this, please increase to 24 units  Novolog:        ICR: 1:15      Target: 130 Insulin sensitivity factor: 20 130-150: +1 unit 151-170: +2 units 171-190: +3 units 191-210: +4 units 211-230: +5 units 231-250: +6 units >250: +7 units If you correct sugars at bedtime, only correct >200 and only give 2 to 3 units.  Last diabetic Foot exam:  Lab Results  Component Value Date/Time   HMDIABEYEEXA No Retinopathy 06/26/2019 12:00 AM    Last diabetic Eye exam: 2020 per endo notes    10/20/20: Patient reports she keeps close tabs on her diet. She is taking [place] 24 units if fasting BG < 200 and 26 units daily if BG > 200 and Novolog per scale above. Patient will follow up with endocrinology tomorrow thus we did not spend much time on medication review today. Patient is using Organic Blue Agave Nectar 1/3 serving (5 gm sugar) in her coffee once daily in the morning.   Plan: Continue close follow up with Dr. Cruzita Lederer   Headaches   Followed by Dr. Melrose Nakayama Patient has failed these meds in past: Nortriptyline, gabapentin Patient is currently uncontrolled on the following medications:   Venlafaxine  37.5 mg - 1 tablet daily (Not taking)  We discussed: Pt has not started taking due to concern about drug interactions. Discussed pertinent drug interactions. Category C - Increased risk of bleeding with Plavix, Recommend monitoring for signs and symptoms of bleeding. Discussed potential to increase BP.  Encouraged watching for abnormal bruising or bleeding. Due to her significant headache pain, risks likely outweigh benefits if effective for headache. Recommend trying venlafaxine and continuing to monitor BP once daily. Next visit with Dr. Melrose Nakayama in December 02, 2020.   Plan: Continue current medications   Medication Management   Misc meds:  Patient doing well off memantine - will remove from med list  Synthroid 75 mcg - 1 tablet daily before breakfast except Sundays  Plavix 75 mg - 1 tablet daily   Lyrica 25 mg - 1 capsule BID   Preservision AREDS - 1 BID  Vitamin D3 1000 IU - 1 daily   CoQ10 400 mg - 1 daily   No cost concerns at this time. Discussed OTC purity - USP verified  Patient's preferred pharmacy is:  [facility] - [place], [place] - [address] [place] [address] Phone: [phone] Fax: [phone]  Uses pill box? Yes Pt endorses good compliance  We discussed: Discussed benefits of medication synchronization, packaging and delivery as  well as enhanced pharmacist oversight with [facility].   Verbal consent obtained for [facility] enhanced pharmacy services (medication synchronization, adherence packaging, delivery coordination). A medication sync plan was created to allow patient to get all medications delivered once every 30 to 90 days per patient preference. Patient understands they have freedom to choose pharmacy and clinical pharmacist will coordinate care between all prescribers and [facility].  Plan  Utilize [facility] for medication synchronization, packaging and delivery    Follow up: 3 month phone visit  Debbora Dus, PharmD Clinical Pharmacist [facility] [phone]

## 2020-10-20 NOTE — Patient Instructions (Addendum)
October 20, 2020  Dear Cassandra Benson,  It was a pleasure meeting you during our initial appointment on October 20, 2020. Below is a summary of the goals we discussed and components of chronic care management. Please contact me anytime with questions or concerns.   Visit Information  Goals Addressed            This Visit's Progress   . Pharmacy Care Plan       CARE PLAN ENTRY (see longitudinal plan of care for additional care plan information)  Current Barriers:  . Chronic Disease Management support, education, and care coordination needs related to Hypertension and Hyperlipidemia   Hypertension BP Readings from Last 3 Encounters:  10/07/20 (!) 150/70  09/16/20 134/62  09/04/20 110/70   . Pharmacist Clinical Goal(s): o Over the next 6 months, patient will work with PharmD and providers to achieve BP goal <140/90 . Current regimen:   Metoprolol succinate 50 mg - 1 daily at bedtime   Losartan 25 mg - 1 daily . Interventions: o Recommend continue to monitor daily if starting venlafaxine o Reviewed home BP log . Patient self care activities - Over the next 6 months, patient will: o Check BP daily, document, and provide at future appointments o Ensure daily salt intake < 2300 mg/day  Hyperlipidemia Lab Results  Component Value Date/Time   LDLCALC 95 07/08/2020 04:46 PM   LDLCALC 160 01/11/2016 12:00 AM   LDLDIRECT 113.0 09/09/2020 07:40 AM   . Pharmacist Clinical Goal(s): o Over the next 6 months, patient will work with PharmD and providers to achieve LDL goal < 100 (ideally < 70) . Current regimen:  o Rosuvastatin 20 mg - 1 daily . Interventions: o Recommend updating lipid panel  . Patient self care activities - Over the next 6 months, patient will: o Continue medication as prescribed  Initial goal documentation        Cassandra Benson was given information about Chronic Care Management services today including:  1. CCM service includes personalized support  from designated clinical staff supervised by her physician, including individualized plan of care and coordination with other care providers 2. 24/7 contact phone numbers for assistance for urgent and routine care needs. 3. Standard insurance, coinsurance, copays and deductibles apply for chronic care management only during months in which we provide at least 20 minutes of these services. Most insurances cover these services at 100%, however patients may be responsible for any copay, coinsurance and/or deductible if applicable. This service may help you avoid the need for more expensive face-to-face services. 4. Only one practitioner may furnish and bill the service in a calendar month. 5. The patient may stop CCM services at any time (effective at the end of the month) by phone call to the office staff.  Patient agreed to services and verbal consent obtained.   Patient verbalizes understanding of instructions provided today and agrees to view in [place].  The pharmacy team will reach out to the patient again over the next 30 days.   Debbora Dus, PharmD Clinical Pharmacist [facility] at [facility] [phone]   Basics of Medicine Management Taking your medicines correctly is an important part of managing or preventing medical problems. Make sure you know what disease or condition your medicine is treating, and how and when to take it. If you do not take your medicine correctly, it may not work well and may cause unpleasant side effects, including serious health problems. What should I do when I am  taking medicines?  Read all the labels and inserts that come with your medicines. Review the information often.  Talk with your pharmacist if you get a refill and notice a change in the size, color, or shape of your medicines.  Know the potential side effects for each medicine that you take.  Try to get all your medicines from the same pharmacy. The pharmacist will have all your  information and will understand how your medicines will affect each other (interact).  Tell your health care provider about all your medicines, including over-the-counter medicines, vitamins, and herbal or dietary supplements. He or she will make sure that nothing will interact with any of your prescribed medicines.   How can I take my medicines safely?  Take medicines only as told by your health care provider. ? Do not take more of your medicine than instructed. ? Do not take anyone else's medicines. ? Do not share your medicines with others. ? Do not stop taking your medicines unless your health care provider tells you to do so. ? You may need to avoid alcohol or certain foods or liquids when taking certain medicines. Follow your health care provider's instructions.  Do not split, mash, or chew your medicines unless your health care provider tells you to do so. Tell your health care provider if you have trouble swallowing your medicines.  For liquid medicine, use the dosing container that was provided. How should I organize my medicines? Know your medicines  Know what each of your medicines looks like. This includes size, color, and shape. Tell your health care provider if you are having trouble recognizing all the medicines that you are taking.  If you cannot tell your medicines apart because they look similar, keep them in original bottles.  If you cannot read the labels on the bottles, tell your pharmacist to put your medicines in containers with large print.  Review your medicines and your schedule with family members, a friend, or a caregiver. Use a pill organizer  Use a tool to organize your medicine schedule. Tools include a weekly pillbox, a written chart, a notebook, or a calendar.  Your tool should help you remember the following things about each medicine: ? The name of the medicine. ? The amount (dose) to take. ? The schedule. This is the day and time the medicine should  be taken. ? The appearance. This includes color, shape, size, and stamp. ? How to take your medicines. This includes instructions to take them with food, without food, with fluids, or with other medicines.  Create reminders for taking your medicines. Use sticky notes, or alarms on your watch, mobile device, or phone calendar.  You may choose to use a more advanced management system. These systems have storage, alarms, and visual and audio prompts.  Some medicines can be taken on an "as-needed" basis. These include medicines for nausea or pain. If you take an as-needed medicine, write down the name and dose, as well as the date and time that you took it.   How should I plan for travel?  Take your pillbox, medicines, and organization system with you when traveling.  Have your medicines refilled before you travel. This will ensure that you do not run out of your medicines while you are away from home.  Always carry an updated list of your medicines with you. If there is an emergency, a first responder can quickly see what medicines you are taking.  Do not pack your medicines in checked  luggage in case your luggage is lost or delayed.  If any of your medicines is considered a controlled substance, make sure you bring a letter from your health care provider with you. How should I store and discard my medicines? For safe storage:  Store medicines in a cool, dry area away from light, or as directed by your health care provider. Do not store medicines in the bathroom. Heat and humidity will affect them.  Do not store your medicines with other chemicals, or with medicines for pets or other household members.  Keep medicines away from children and pets. Do not leave them on counters or bedside tables. Store them in high cabinets or on high shelves. For safe disposal:  Check expiration dates regularly. Do not take expired medicines. Discard medicines that are older than the expiration  date.  Learn a safe way to dispose of your medicines. You may: ? Use a local government, hospital, or pharmacy medicine-take-back program. ? Mix the medicines with inedible substances, put them in a sealed bag or empty container, and throw them in the trash. What should I remember?  Tell your health care provider if you: ? Experience side effects. ? Have new symptoms. ? Have other concerns about taking your medicines.  Review your medicines regularly with your health care provider. Other medicines, diet, medical conditions, weight changes, and daily habits can all affect how medicines work. Ask if you need to continue taking each medicine, and discuss how well each one is working.  Refill your medicines early to avoid running out of them.  In case of an accidental overdose, call your local [place] at [phone] or visit your local emergency department immediately. This is important. Summary  Taking your medicines correctly is an important part of managing or preventing medical problems.  You need to make sure that you understand what you are taking a medicine for, as well as how and when you need to take it.  Know your medicines and use a pill organizer to help you take your medicines correctly.  In case of an accidental overdose, call your local [place] at [phone] or visit your local emergency department immediately. This is important. This information is not intended to replace advice given to you by your health care provider. Make sure you discuss any questions you have with your health care provider. Document Revised: 09/07/2017 Document Reviewed: 09/07/2017 Elsevier Patient Education  2021 [employer].

## 2020-10-21 ENCOUNTER — Encounter: Payer: Self-pay | Admitting: Internal Medicine

## 2020-10-21 ENCOUNTER — Other Ambulatory Visit: Payer: Self-pay

## 2020-10-21 ENCOUNTER — Ambulatory Visit (INDEPENDENT_AMBULATORY_CARE_PROVIDER_SITE_OTHER): Payer: HMO | Admitting: Internal Medicine

## 2020-10-21 VITALS — BP 120/70 | HR 69 | Ht 63.0 in | Wt 159.6 lb

## 2020-10-21 DIAGNOSIS — E782 Mixed hyperlipidemia: Secondary | ICD-10-CM | POA: Diagnosis not present

## 2020-10-21 DIAGNOSIS — E038 Other specified hypothyroidism: Secondary | ICD-10-CM | POA: Diagnosis not present

## 2020-10-21 DIAGNOSIS — E063 Autoimmune thyroiditis: Secondary | ICD-10-CM

## 2020-10-21 DIAGNOSIS — E139 Other specified diabetes mellitus without complications: Secondary | ICD-10-CM | POA: Diagnosis not present

## 2020-10-21 MED ORDER — TRESIBA FLEXTOUCH 100 UNIT/ML ~~LOC~~ SOPN
26.0000 [IU] | PEN_INJECTOR | Freq: Every day | SUBCUTANEOUS | 3 refills | Status: DC
Start: 2020-10-21 — End: 2020-11-06

## 2020-10-21 NOTE — Patient Instructions (Signed)
Please increase: - Tresiba 26 units in am   Continue: - Novolog: ICR: 1:15 Target: 130 Insulin sensitivity factor: 20 130-150: +1 unit 151-170: +2 units 171-190: +3 units 191-210: +4 units 211-230: +5 units 231-250: +6 units >250: +7 units If you correct sugars at bedtime, only correct >200 and only give 2 to 3 units.  Please contact the [employer] rep, Deberah Castle.  Please return in 1.5 months with your sugar log.

## 2020-10-21 NOTE — Progress Notes (Signed)
Patient ID: Cassandra Benson, female   DOB: 06-18-1941, 80 y.o.   MRN: [id number]   This visit occurred during the SARS-CoV-2 public health emergency.  Safety protocols were in place, including screening questions prior to the visit, additional usage of staff PPE, and extensive cleaning of exam room while observing appropriate contact time as indicated for disinfecting solutions.   HPI: Cassandra Benson is a 80 y.o.-year-old female, returning for f/u for LADA, initially dx'ed in 1998 (80 y/o), started insulin at dx, started insulin pump in ~2008, uncontrolled, without complications and also Hashimoto's hypothyroidism. She previously saw endocrinology at [facility] (Dr. Janese Banks) and Dr Howell Rucks. Last visit with me 1.5 mo ago.  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 She has severe HAs and blurry vision. She had a negative Bx in the past. No steroids. She will start Effexor. On Lyrica also.  At last visit, she returns after coming off the insulin pump but did not contact me about this and she was off long-acting insulin >> sugars were extremely high.  We restarted [place] and they improved.  Reviewed HbA1c levels: Lab Results  Component Value Date   HGBA1C 12.0 (A) 09/04/2020   HGBA1C 9.3 (A) 05/25/2020   HGBA1C 8.4 (H) 01/15/2020   She is back on: - Tresiba 20-22 units in am >> off at last visit!!! >> restarted 20 mg daily - Novolog: ICR: 1:8 (starches)-1:10 (other foods), but mostly using carb equivalents >> actually using 1:15 Target: 130 Insulin sensitivity factor: 20 If you correct sugars at bedtime, only correct >200 and only give 2 to 3 units. She was previously using Glucophage ER d.a.w. but had to stop because this was not covered by her insurance.  She tried the generic metformin ER and this caused diarrhea so she had to stop.  Insulin pump:  -Previously: Medtronic 723-started 09/2016 (changed 07/2017), without CGM.   She was using Medtronic for supplies before, but  she is then forced by insurance to use [employer].  She came off the pump in the past as she had a lot of problems getting supplies from them. -then T:slim x2 - started 2020 - stopped since last OV  CGM: -Dexcom G6 >> off now  Prev. Pump settings: - basal rates: 12 am: 0.800 units/h >> 0.950 3 am: 0.900 >> 1.000 9:30 am: 0.650 >> 0.750 5 pm: 1.300 6:30 pm: 1.200 11:30 pm: 1.200 - ICR: 1:8 - target: 110 - ISF:  12 am: 20 8 pm: 30 - Active insulin time: 4h TDD from basal insulin: 52% (18 units) >> 55% (18 units) TDD from bolus insulin: 48% (17 units) >> 45% (15 units) TDD: 35 to 50 units >> 35-50 >> up to 50 units  - extended bolusing: not using - changes infusion site: q3 days  She checks her sugars 1-2x a day per review of her meter download: - am: 157, 171-264 >> 103, 247-378, 416 >> 172-254, 280 - 2h after b'fast: n/c - lunch: 188-239 >> 345 >> n/c - 2h after lunch: 292 >> n/c >> 181 - dinner: 81, 98-180 >> 254, 260 >> 85 - 2h after dinner: 128, 270 >> n/c - bedtime: 255 >> n/c  Lowest sugar was 40 x 1 at night >> 103 x1 >> 85; she has hypoglycemia awareness I the 80s.  No history of hypoglycemia admissions.  She has a glucagon kit at home. Highest sugar was 500s (dehydration, afterwards: 339) >> 271 >> 416 >> 280; no  history of DKA admissions:  Pt's meals are: - Breakfast: protein drink + almond milk - Lunch: PB jelly sandwich or sandwich with ham or cream cheese sandwich - Dinner: salads  - Snacks: pretzels; pork tenderloin + veggies; chicken + tenderloin No sodas. She stopped sweet and low and lipids improved.   No CKD, last BUN/creatinine:  Lab Results  Component Value Date   BUN 9 09/09/2020   BUN 13 07/16/2020   CREATININE 0.93 09/09/2020   CREATININE 0.95 07/16/2020  On lisinopril. + HL;  last set of lipids: Lab Results  Component Value Date   CHOL 183 07/08/2020   HDL 53.50 07/08/2020   LDLCALC 95 07/08/2020   LDLDIRECT 113.0 09/09/2020   TRIG  175.0 (H) 07/08/2020   CHOLHDL 3 07/08/2020  On Crestor 10 -she continues to have muscle cramps despite using CoQ10.  She also takes [employer].  Praluent was not covered. - last eye exam was in 05/2019: no DR. She previously saw an ophthalmologist in [place] and would like to establishing care and [place]. - no numbness and tingling in her feet.  She was admitted for CP + SOB 10/01/2017.  Cardiac events and PE were ruled out.  She is seeing cardiology (Dr. Rockey Situ). She had right carotid ultrasound in 2020 and there is a 39% stenosis, significantly increased since 2018.  She has a history of left carotid endarterectomy. She was started on compression hoses by [facility].  These helped. 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  Hypothyroidism: -Due to Hashimoto's thyroiditis -Positive family history of Graves' disease in her mother -She was initially on Levoxyl, but she thought Levoxyl increased her lipid levels and caused hair loss, so we changed to Synthroid.  She feels much better on Synthroid  She is currently on Synthroid 75 mcg 6 out of 7 days: - in am - fasting - at least 30 min from b'fast - no calcium - no iron - + multivitamins at night - no PPIs - not on Biotin  Reviewed her TFTs: Lab Results  Component Value Date   TSH 2.16 09/09/2020   TSH 1.73 11/27/2019   TSH 0.12 (L) 10/07/2019   TSH 0.22 (L) 09/10/2019   TSH 1.10 11/06/2018   TSH 0.09 (L) 08/29/2018   TSH 0.92 11/27/2017   TSH 0.30 (L) 10/11/2017   TSH 0.24 (L) 08/11/2017   TSH 0.58 01/04/2017   She also has a history of SLE and Sjogren's syndrome.  She has generalized muscle aches and joint aches.  In 2015, we checked her hormone levels in the setting of hirsutism and and they were normal.  Free testosterone, LH, FSH, DHEA-S, dexamethasone suppression test and estradiol were all normal.  Also, an AM cortisol and ACTH levels were  normal.  She was admitted 01/15/2020 with SBO.  She was also diagnosed with mild cognitive impairment and was started on Namenda before last visit.  ROS: Constitutional: no weight gain/no weight loss, no fatigue, no subjective hyperthermia, no subjective hypothermia Eyes: no blurry vision, no xerophthalmia ENT: no sore throat, no nodules palpated in neck, no dysphagia, no odynophagia, no hoarseness Cardiovascular: no CP/no SOB/no palpitations/no leg swelling Respiratory: no cough/no SOB/no wheezing Gastrointestinal: no N/no V/no D/no C/no acid reflux Musculoskeletal: + muscle aches/+ joint aches Skin: no rashes, no hair loss Neurological: no tremors/no numbness/no tingling/no dizziness, ++ HAs  I reviewed pt's medications, allergies, PMH, social hx, family hx, and changes were documented in  the history of present illness. Otherwise, unchanged from my initial visit note.   Past Medical History:  Diagnosis Date  . Allergy   . ANA positive    positive ANA pattern 1 speckled  . Arthritis   . Carotid stenosis, asymptomatic 06/19/2015   0-21% RICA 11-73% LICA rpt 1 yr (01/6700)   . Colon polyps   . Dermatomyositis ([place])   . Diabetes mellitus without complication (HCC)    Type 1  . Diverticulosis    sigmoid on CT scan 12/2019  . Family history of adverse reaction to anesthesia    brothr went into cardiac arrest from anectine  . Fibromyalgia    prior PCP  . GERD (gastroesophageal reflux disease)    prior PCP  . Glaucoma    Narrow angle  . History of blood clots    DVT, in 20s, none since  . History of chicken pox   . History of diverticulitis   . History of pericarditis 1986   with hospitalization  . History of pneumonia 2014  . History of shingles   . History of UTI   . Hyperlipidemia   . Hypertension   . Hypothyroidism   . Mixed connective tissue disease ([place])   . Partial small bowel obstruction ([place]) 12/2019   managed conservatively  . Peptic ulcer   . Pneumonia   .  PONV (postoperative nausea and vomiting)   . Raynaud's disease without gangrene   . Shoulder pain left   h/o RTC tendonitis and adhesive capsulitis  . Sjogren's syndrome ([place])   . Sleep apnea    prior PCP - no CPAP for about 10 yrs  . 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[place])   . Vitamin D deficiency    prior PCP   Past Surgical History:  Procedure Laterality Date  . ABDOMINAL HYSTERECTOMY  1978   fibroids and menorrhagia, ovaries remain  . ARTERY BIOPSY Right 04/06/2018   Procedure: BIOPSY TEMPORAL ARTERY RIGHT;  Surgeon: Beverly Gust, MD;  Location: [facility];  Service: ENT;  Laterality: Right;  Diabetic - insulin pump sleep apnea  . [facility] normal per patient  . COLONOSCOPY  10/2011   1 TA, 1 HP, very tortuous colon ([place])  . COLONOSCOPY  02/2020   TA, inflammatory polyp, mod diverticulosis, int/ext hemorrhoids ([place]) no rpt recommended   . COLONOSCOPY WITH ESOPHAGOGASTRODUODENOSCOPY (EGD)  03/2007   2 ulcers, benign polyp, rpt 5 yrs ([place], [place])  . JOINT REPLACEMENT Right    hip  . PARTIAL HIP ARTHROPLASTY  2013   Right hip replacement  . TONSILLECTOMY    . TONSILLECTOMY AND ADENOIDECTOMY    . TRANSCAROTID ARTERY REVASCULARIZATION Left 07/23/2018   Procedure: TRANSCAROTID ARTERY REVASCULARIZATION;  Surgeon: Marty Heck, MD;  Location: [facility];  Service: Vascular;  Laterality: Left;  . TUBAL LIGATION    . VAGINAL DELIVERY     x2, no complications   Social History   Social History  . Widowed   . Number of children: 2   Occupational History  Retired    Social History Main Topics  . Smoking status: Never Smoker  . Smokeless tobacco: Never Used  . Alcohol use No  . Drug use: No   Social History Narrative   Lives in [place] now. Recently moved from [place].   No pets.   Mother of Merrick Feutz.   Grandson committed suicide in [place]    Work - retired, prior Production manager  Hobbies - works with  her church, [employer]   Exercise - limited   Diet - good water, fruits/vegetables daily, limited meat, protein drink every morning   Current Outpatient Medications on File Prior to Visit  Medication Sig Dispense Refill  . acetaminophen (TYLENOL) 500 MG tablet Take 1 tablet (500 mg total) by mouth 3 (three) times daily as needed.    . Ascorbic Acid (VITAMIN C) 100 MG tablet Take 100 mg by mouth daily.    . BD ULTRA-FINE LANCETS lancets Use as instructed upto 6 times daily 600 each 2  . benzonatate (TESSALON) 100 MG capsule Take 1 capsule (100 mg total) by mouth 3 (three) times daily as needed for cough. 30 capsule 3  . Cholecalciferol (VITAMIN D3) 25 MCG (1000 UT) CAPS Take 1 capsule (1,000 Units total) by mouth daily. 30 capsule   . clopidogrel (PLAVIX) 75 MG tablet TAKE 1 TABLET(75 MG) BY MOUTH DAILY 30 tablet 6  . Continuous Blood Gluc Transmit (DEXCOM G6 TRANSMITTER) MISC 1 kit by Does not apply route See admin instructions. Use to check blood sugar 1 each 1  . diclofenac Sodium (VOLTAREN) 1 % GEL Apply 2 g topically 3 (three) times daily. 100 g 3  . glucagon 1 MG injection Inject 1 mg into the muscle once as needed for up to 1 dose. 1 each 12  . glucose blood test strip Use as instructed to check glucose 4 times daily. 400 each 3  . insulin aspart (NOVOLOG) 100 UNIT/ML injection Sliding scale-no more than 50 units per day    . insulin degludec (TRESIBA FLEXTOUCH) 100 UNIT/ML FlexTouch Pen Inject 20 Units into the skin daily. (Patient taking differently: Inject 20 Units into the skin daily. Pt taking 20 to 26 units daily) 9 mL 3  . Insulin Pen Needle 32G X 4 MM MISC Use 4-5x a day 300 each 3  . losartan (COZAAR) 25 MG tablet Take 1 tablet (25 mg total) by mouth daily. 90 tablet 3  . metoprolol succinate (TOPROL-XL) 50 MG 24 hr tablet Take 50 mg by mouth at bedtime. Take with or immediately following a meal.    . nitroGLYCERIN (NITROSTAT) 0.4 MG SL tablet Place 1 tablet (0.4 mg total)  under the tongue every 5 (five) minutes as needed for chest pain. 30 tablet 1  . pregabalin (LYRICA) 25 MG capsule Take 25 mg by mouth daily.     . rosuvastatin (CRESTOR) 20 MG tablet Take 1 tablet (20 mg total) by mouth daily. 90 tablet 3  . SYNTHROID 75 MCG tablet Take 1 tablet (75 mcg total) by mouth daily before breakfast. Don't take any on Sundays    . venlafaxine (EFFEXOR) 37.5 MG tablet Take 37.5 mg once a day for a week then increase to 37.5 mg twice a day and continue that dose     No current facility-administered medications on file prior to visit.   Allergies  Allergen Reactions  . Iodinated Diagnostic Agents Other (See Comments)    Itching (severe) and chest tightness  . Penicillins Anaphylaxis, Swelling, Rash and Other (See Comments)    Has patient had a PCN reaction causing immediate rash, facial/tongue/throat swelling, SOB or lightheadedness with hypotension: Yes Has patient had a PCN reaction causing severe rash involving mucus membranes or skin necrosis: Unknown Has patient had a PCN reaction that required hospitalization: Unknown Has patient had a PCN reaction occurring within the last 10 years: No If all of the above answers are "NO", then may  proceed with Cephalosporin use.   . Amlodipine Swelling    Pedal edema  . Anectine [Succinylcholine] Other (See Comments)    Brother went into cardiac arrest.  . Codeine Nausea Only  . Gabapentin Other (See Comments)    Gait abnormality  . Influenza Vaccines Other (See Comments)    Muscle weakness; unable to walk  . Nortriptyline Other (See Comments)    Eye swelling and mouth drawed up  . Pamelor [Nortriptyline Hcl] Other (See Comments)    Patient states caused her face to draw in together.  . Valsartan Other (See Comments) and Cough    Allergy to generic only, "Hacking" cough  . Zetia [Ezetimibe] Other (See Comments)    Bad muscle cramps  . Erythromycin Rash and Swelling  . Sulfa Antibiotics Rash   Family History   Problem Relation Age of Onset  . CAD Mother 49       MI, aortic valve issues  . COPD Mother   . Lupus Mother   . Graves' disease Mother   . Rheum arthritis Mother   . CAD Father 20       CABG x2, aortic valve replacement  . Stroke Sister   . CAD Sister   . Anuerysm Sister        brain  . Lupus Sister   . Diabetes Sister   . Alcohol abuse Brother   . CAD Brother 78       MI  . Stroke Brother   . Seizures Son   . COPD Brother        agent orange  . CAD Brother 50       stent  . Diabetes Brother   . Depression Grandchild   . Breast cancer Maternal Aunt   . Diabetes Sister   . Breast cancer Sister   . Breast cancer Maternal Aunt   . Stroke Maternal Grandmother   . Hypertension Maternal Grandmother   . Gallbladder disease Maternal Grandmother   . Colon cancer Neg Hx   . Esophageal cancer Neg Hx   . Rectal cancer Neg Hx   . Stomach cancer Neg Hx    PE: BP 120/70   Pulse 69   Ht '5\' 3"'  (1.6 m)   Wt 159 lb 9.6 oz (72.4 kg)   SpO2 97%   BMI 28.27 kg/m  Wt Readings from Last 3 Encounters:  10/21/20 159 lb 9.6 oz (72.4 kg)  10/07/20 159 lb 11.2 oz (72.4 kg)  09/16/20 160 lb 4 oz (72.7 kg)   Constitutional: normal weight, in NAD Eyes: PERRLA, EOMI, no exophthalmos ENT: moist mucous membranes, + palpable symmetric thyroid, no cervical lymphadenopathy Cardiovascular: RRR, No MRG Respiratory: CTA B Gastrointestinal: abdomen soft, NT, ND, BS+ Musculoskeletal: no deformities, strength intact in all 4 Skin: moist, warm, no rashes, + acne on face (believed to be from NovoLog Neurological: no tremor with outstretched hands, DTR normal in all 4  ASSESSMENT: 1. DM1, uncontrolled, without long-term complications, but with hyperglycemia  2.  Hashimoto's hypothyroidism  3. HL  PLAN:  1. Patient with uncontrolled LADA, on basal/bolus insulin regimen, with improved control since last visit after addition of basal insulin.  At that time, she returned after being off the  insulin pump (as she felt that it was giving her errors) and only taking rapid acting insulin. Sugars were in the 200-400 range.  After adding back [place], sugars improved but they are still above goal in the morning, many still in the 200s.  Later  in the day, sugars are better but she only has 2 readings in the last 2 weeks.  -At last visit we discussed about restarting her Medtronic insulin pump. I gave her the Medtronic rep in call and advised her to call them.  She was not able to get in touch with them. At this visit, I gave her the Medtr[employer] rep card with contact information and I am hoping that she can get in touch with them. -For now, however, since sugars are high in the morning, we will go ahead and increase the [place] dose. I am not going to make changes in her NovoLog dosing especially since she is preparing to go back on the insulin pump. - I advised her to:  Patient Instructions  Please increase: - Tresiba 26 units in am   Continue: - Novolog: ICR: 1:15 Target: 130 Insulin sensitivity factor: 20 130-150: +1 unit 151-170: +2 units 171-190: +3 units 191-210: +4 units 211-230: +5 units 231-250: +6 units >250: +7 units If you correct sugars at bedtime, only correct >200 and only give 2 to 3 units.  Please contact the Medtronic rep, Deberah Castle.  Please return in 1.5 months with your sugar log.   - advised to check sugars at different times of the day - 4x a day, rotating check times - advised for yearly eye exams >> she is UTD - return to clinic in 1.5 months  2.  Hashimoto's hypothyroidism - she does have a palpable thyroid, most likely due to inflammation in the setting of Hashimoto's thyroiditis. - latest thyroid labs reviewed with pt >> normal: Lab Results  Component Value Date   TSH 2.16 09/09/2020   - she continues on LT4 75 mcg 6/7 days - pt feels good on this dose. - we discussed about taking the thyroid hormone every day, with water, >30 minutes before  breakfast, separated by >4 hours from acid reflux medications, calcium, iron, multivitamins. Pt. is taking it correctly.  3. HL -Reviewed latest lipid mammogram 06/2020: LDL at goal, TG slightly high: Lab Results  Component Value Date   CHOL 183 07/08/2020   HDL 53.50 07/08/2020   LDLCALC 95 07/08/2020   LDLDIRECT 113.0 09/09/2020   TRIG 175.0 (H) 07/08/2020   CHOLHDL 3 07/08/2020  -She continues on Crestor 10.  She has some muscle aches, unclear if from Crestor  Philemon Kingdom, MD PhD [facility] Endocrinology

## 2020-10-21 NOTE — Progress Notes (Signed)
I have collaborated with the care management provider regarding care management and care coordination activities outlined in this encounter and have reviewed this encounter including documentation in the note and care plan. I am certifying that I agree with the content of this note and encounter as supervising physician.  

## 2020-10-29 ENCOUNTER — Other Ambulatory Visit: Payer: Self-pay

## 2020-10-29 ENCOUNTER — Telehealth: Payer: Self-pay | Admitting: Internal Medicine

## 2020-10-29 ENCOUNTER — Telehealth: Payer: Self-pay

## 2020-10-29 DIAGNOSIS — E139 Other specified diabetes mellitus without complications: Secondary | ICD-10-CM

## 2020-10-29 MED ORDER — SYNTHROID 75 MCG PO TABS: 75.0000 ug | ORAL_TABLET | Freq: Every day | ORAL | 1 refills | Status: DC

## 2020-10-29 MED ORDER — PREGABALIN 25 MG PO CAPS
25.0000 mg | ORAL_CAPSULE | Freq: Every day | ORAL | 1 refills | Status: DC
Start: 2020-10-29 — End: 2020-12-01

## 2020-10-29 MED ORDER — METOPROLOL SUCCINATE ER 50 MG PO TB24
50.0000 mg | ORAL_TABLET | Freq: Every day | ORAL | 1 refills | Status: DC
Start: 2020-10-29 — End: 2021-06-15

## 2020-10-29 MED ORDER — INSULIN PEN NEEDLE 32G X 4 MM MISC: 3 refills | Status: DC

## 2020-10-29 NOTE — Telephone Encounter (Signed)
Rx sent to preferred pharmacy.

## 2020-10-29 NOTE — Telephone Encounter (Signed)
-----   Message from [place], [facility] sent at 10/29/2020 10:42 AM EST ----- Regarding: Refills Refills needed on Lyrica, Synthroid, and metoprolol to [facility].  Thanks!  Debbora Dus, PharmD Clinical Pharmacist [facility] [phone]

## 2020-10-29 NOTE — Telephone Encounter (Signed)
Name of Medication:  [place] Name of Pharmacy: [facility]  Last Fill or Written Date and Quantity: 07/08/20 Last Office Visit and Type: 10/07/20, HA Next Office Visit and Type: 03/16/21, 6 mo f/u Last Controlled Substance Agreement Date: none Last UDS: none  Synthroid last rx:  05/20/20 metoprolol

## 2020-10-29 NOTE — Telephone Encounter (Signed)
Request for a refill of pen needles to [facility]

## 2020-10-29 NOTE — Telephone Encounter (Signed)
ERx 

## 2020-10-29 NOTE — Chronic Care Management (AMB) (Addendum)
Chronic Care Management Pharmacy Assistant   Name: Cassandra Benson  MRN: [id number] DOB: 09-19-1941  Reason for Encounter: Medication Review - Medication refill request  PCP : Cassandra Bush, MD  Allergies:   Allergies  Allergen Reactions   Iodinated Diagnostic Agents Other (See Comments)    Itching (severe) and chest tightness   Penicillins Anaphylaxis, Swelling, Rash and Other (See Comments)    Has patient had a PCN reaction causing immediate rash, facial/tongue/throat swelling, SOB or lightheadedness with hypotension: Yes Has patient had a PCN reaction causing severe rash involving mucus membranes or skin necrosis: Unknown Has patient had a PCN reaction that required hospitalization: Unknown Has patient had a PCN reaction occurring within the last 10 years: No If all of the above answers are "NO", then may proceed with Cephalosporin use.    Amlodipine Swelling    Pedal edema   Anectine [Succinylcholine] Other (See Comments)    Brother went into cardiac arrest.   Codeine Nausea Only   Gabapentin Other (See Comments)    Gait abnormality   Influenza Vaccines Other (See Comments)    Muscle weakness; unable to walk   Nortriptyline Other (See Comments)    Eye swelling and mouth drawed up   Pamelor [Nortriptyline Hcl] Other (See Comments)    Patient states caused her face to draw in together.   Valsartan Other (See Comments) and Cough    Allergy to generic only, "Hacking" cough   Zetia [Ezetimibe] Other (See Comments)    Bad muscle cramps   Erythromycin Rash and Swelling   Sulfa Antibiotics Rash    Medications: Outpatient Encounter Medications as of 10/29/2020  Medication Sig   acetaminophen (TYLENOL) 500 MG tablet Take 1 tablet (500 mg total) by mouth 3 (three) times daily as needed.   Ascorbic Acid (VITAMIN C) 100 MG tablet Take 100 mg by mouth daily.   BD ULTRA-FINE LANCETS lancets Use as instructed upto 6 times daily   benzonatate (TESSALON) 100 MG capsule Take 1  capsule (100 mg total) by mouth 3 (three) times daily as needed for cough.   Cholecalciferol (VITAMIN D3) 25 MCG (1000 UT) CAPS Take 1 capsule (1,000 Units total) by mouth daily.   clopidogrel (PLAVIX) 75 MG tablet TAKE 1 TABLET(75 MG) BY MOUTH DAILY   Continuous Blood Gluc Transmit (DEXCOM G6 TRANSMITTER) MISC 1 kit by Does not apply route See admin instructions. Use to check blood sugar   diclofenac Sodium (VOLTAREN) 1 % GEL Apply 2 g topically 3 (three) times daily.   glucagon 1 MG injection Inject 1 mg into the muscle once as needed for up to 1 dose.   glucose blood test strip Use as instructed to check glucose 4 times daily.   insulin aspart (NOVOLOG) 100 UNIT/ML injection Sliding scale-no more than 50 units per day   insulin degludec (TRESIBA FLEXTOUCH) 100 UNIT/ML FlexTouch Pen Inject 26 Units into the skin daily.   Insulin Pen Needle 32G X 4 MM MISC Use 4-5x a day   losartan (COZAAR) 25 MG tablet Take 1 tablet (25 mg total) by mouth daily.   metoprolol succinate (TOPROL-XL) 50 MG 24 hr tablet Take 50 mg by mouth at bedtime. Take with or immediately following a meal.   nitroGLYCERIN (NITROSTAT) 0.4 MG SL tablet Place 1 tablet (0.4 mg total) under the tongue every 5 (five) minutes as needed for chest pain.   pregabalin (LYRICA) 25 MG capsule Take 25 mg by mouth daily.    rosuvastatin (CRESTOR) 20  MG tablet Take 1 tablet (20 mg total) by mouth daily.   SYNTHROID 75 MCG tablet Take 1 tablet (75 mcg total) by mouth daily before breakfast. Don't take any on Sundays   venlafaxine (EFFEXOR) 37.5 MG tablet Take 37.5 mg once a day for a week then increase to 37.5 mg twice a day and continue that dose   No facility-administered encounter medications on file as of 10/29/2020.    Current Diagnosis: Patient Active Problem List   Diagnosis Date Noted   Cause of injury, fall 09/01/2020   Grade II diastolic dysfunction 32/91/9166   Chronic venous insufficiency 04/07/2020   Bowel habit changes  02/18/2020   Diverticulosis    Partial small bowel obstruction ([place]) 01/15/2020   Epistaxis 01/13/2020   Cough 01/05/2020   MCI (mild cognitive impairment) 10/22/2019   Right sided abdominal pain 10/07/2019   Skin lesion 09/14/2019   Renal insufficiency 09/14/2019   Pain in right buttock 06/18/2019   Chronic midline thoracic back pain 05/14/2019   Sjogren's syndrome without extraglandular involvement ([place]) 02/26/2019   Epigastric discomfort 12/17/2018   PAD (peripheral artery disease) ([place]) 10/04/2018   Raynaud disease 09/04/2018   Amaurosis fugax, both eyes 06/18/2018   TIA (transient ischemic attack) 05/21/2018   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   Facial paresthesia 02/07/2018   Pain and swelling of lower leg 12/25/2017   Fibromyalgia    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), bilateral 08/21/2017   Intractable episodic headache 03/13/2017   Numbness and tingling of both lower extremities 03/13/2017   6th nerve palsy, 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   Fatty liver 02/13/2017   LADA (latent autoimmune diabetes in adults), managed as type 1 ([place]) 10/03/2016   Medicare annual wellness visit, subsequent 06/19/2015   Health maintenance examination 06/19/2015   Advanced care planning/counseling discussion 06/19/2015   Carotid stenosis, 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 06/19/2015   Vitamin D deficiency    Family history of premature CAD 06/02/2015   Chest pain 05/26/2015   Elevated testosterone level in female 09/04/2014   Hyperlipidemia associated with type 2 diabetes mellitus ([place]) 07/30/2014   Essential hypertension 07/17/2014   Hypothyroidism due to Hashimoto's thyroiditis 07/17/2014   Adjustment disorder with mixed anxiety and depressed mood 07/17/2014   Apnea, sleep 08/22/2012   LBP (low back pain) 02/27/2012   Positive ANA (antinuclear antibody) 01/06/2012   [name] is due for medication adherence delivery from [facility] 11/04/20. Refills needed on pen needles and  Venlfaxine. Contacted Dr. Cruzita Lederer for pen needles who agreed to send in refills. Contacted Dr. Gurney Maxin for Venlafaxine refill - message was sent to clinical staff for refill.   Follow-Up:  Care Coordination with Outside Provider, Coordination of Enhanced Pharmacy Services and Pharmacist Review  Debbora Dus, CPP notified  Margaretmary Dys, [place] Assistant [phone]  I have reviewed the care management and care coordination activities outlined in this encounter and I am certifying that I agree with the content of this note. No further action required.  Debbora Dus, PharmD Clinical Pharmacist [place] [facility] at [facility] [phone]

## 2020-11-04 DIAGNOSIS — H353132 Nonexudative age-related macular degeneration, bilateral, intermediate dry stage: Secondary | ICD-10-CM | POA: Diagnosis not present

## 2020-11-04 DIAGNOSIS — E119 Type 2 diabetes mellitus without complications: Secondary | ICD-10-CM | POA: Diagnosis not present

## 2020-11-04 LAB — HM DIABETES EYE EXAM

## 2020-11-05 ENCOUNTER — Other Ambulatory Visit: Payer: Self-pay | Admitting: Internal Medicine

## 2020-11-05 ENCOUNTER — Telehealth: Payer: Self-pay

## 2020-11-05 DIAGNOSIS — E1169 Type 2 diabetes mellitus with other specified complication: Secondary | ICD-10-CM

## 2020-11-05 NOTE — Telephone Encounter (Signed)
M, can you please call this in? Ty! C

## 2020-11-05 NOTE — Chronic Care Management (AMB) (Addendum)
Chronic Care Management Pharmacy Assistant   Name: Cassandra Benson  MRN: [id number] DOB: [birth year]  Reason for Encounter: Medication Cost Inquiry  PCP : [name], MD  Allergies:   Allergies  Allergen Reactions   Iodinated Diagnostic Agents Other (See Comments)    Itching (severe) and chest tightness   Penicillins Anaphylaxis, Swelling, Rash and Other (See Comments)    Has patient had a PCN reaction causing immediate rash, facial/tongue/throat swelling, SOB or lightheadedness with hypotension: Yes Has patient had a PCN reaction causing severe rash involving mucus membranes or skin necrosis: Unknown Has patient had a PCN reaction that required hospitalization: Unknown Has patient had a PCN reaction occurring within the last 10 years: No If all of the above answers are "NO", then may proceed with Cephalosporin use.    Amlodipine Swelling    Pedal edema   Anectine [Succinylcholine] Other (See Comments)    Brother went into cardiac arrest.   Codeine Nausea Only   Gabapentin Other (See Comments)    Gait abnormality   Influenza Vaccines Other (See Comments)    Muscle weakness; unable to walk   Nortriptyline Other (See Comments)    Eye swelling and mouth drawed up   Pamelor [Nortriptyline Hcl] Other (See Comments)    Patient states caused her face to draw in together.   Valsartan Other (See Comments) and Cough    Allergy to generic only, "Hacking" cough   Zetia [Ezetimibe] Other (See Comments)    Bad muscle cramps   Erythromycin Rash and Swelling   Sulfa Antibiotics Rash    Medications: Outpatient Encounter Medications as of 11/05/2020  Medication Sig   acetaminophen (TYLENOL) 500 MG tablet Take 1 tablet (500 mg total) by mouth 3 (three) times daily as needed.   Ascorbic Acid (VITAMIN C) 100 MG tablet Take 100 mg by mouth daily.   BD ULTRA-FINE LANCETS lancets Use as instructed upto 6 times daily   benzonatate (TESSALON) 100 MG capsule Take 1 capsule (100 mg  total) by mouth 3 (three) times daily as needed for cough.   Cholecalciferol (VITAMIN D3) 25 MCG (1000 UT) CAPS Take 1 capsule (1,000 Units total) by mouth daily.   clopidogrel (PLAVIX) 75 MG tablet TAKE 1 TABLET(75 MG) BY MOUTH DAILY   Continuous Blood Gluc Transmit (DEXCOM G6 TRANSMITTER) MISC 1 kit by Does not apply route See admin instructions. Use to check blood sugar   diclofenac Sodium (VOLTAREN) 1 % GEL Apply 2 g topically 3 (three) times daily.   glucagon 1 MG injection Inject 1 mg into the muscle once as needed for up to 1 dose.   glucose blood test strip Use as instructed to check glucose 4 times daily.   insulin aspart (NOVOLOG) 100 UNIT/ML injection USE UP TO 50 UNITS DAILY IN THE INSULIN PUMP   insulin degludec (TRESIBA FLEXTOUCH) 100 UNIT/ML FlexTouch Pen Inject 26 Units into the skin daily.   Insulin Pen Needle 32G X 4 MM MISC Use 4-5x a day   losartan (COZAAR) 25 MG tablet Take 1 tablet (25 mg total) by mouth daily.   metoprolol succinate (TOPROL-XL) 50 MG 24 hr tablet Take 1 tablet (50 mg total) by mouth at bedtime. Take with or immediately following a meal.   nitroGLYCERIN (NITROSTAT) 0.4 MG SL tablet Place 1 tablet (0.4 mg total) under the tongue every 5 (five) minutes as needed for chest pain.   pregabalin (LYRICA) 25 MG capsule Take 1 capsule (25 mg total) by mouth daily.  rosuvastatin (CRESTOR) 20 MG tablet Take 1 tablet (20 mg total) by mouth daily.   SYNTHROID 75 MCG tablet Take 1 tablet (75 mcg total) by mouth daily before breakfast. Don't take any on Sundays   venlafaxine (EFFEXOR) 37.5 MG tablet Take 37.5 mg once a day for a week then increase to 37.5 mg twice a day and continue that dose   No facility-administered encounter medications on file as of 11/05/2020.    Current Diagnosis: Patient Active Problem List   Diagnosis Date Noted   Cause of injury, fall 09/01/2020   Grade II diastolic dysfunction 35/45/6256   Chronic venous insufficiency 04/07/2020   Bowel  habit changes 02/18/2020   Diverticulosis    Partial small bowel obstruction ([place]) 01/15/2020   Epistaxis 01/13/2020   Cough 01/05/2020   MCI (mild cognitive impairment) 10/22/2019   Right sided abdominal pain 10/07/2019   Skin lesion 09/14/2019   Renal insufficiency 09/14/2019   Pain in right buttock 06/18/2019   Chronic midline thoracic back pain 05/14/2019   Sjogren's syndrome without extraglandular involvement ([place]) 02/26/2019   Epigastric discomfort 12/17/2018   PAD (peripheral artery disease) (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), bilateral 08/21/2017   Intractable episodic headache 03/13/2017   Numbness and tingling of both lower extremities 03/13/2017   6th nerve palsy, 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   Fatty liver 02/13/2017   LADA (latent autoimmune diabetes in adults), managed as type 1 ([place]) 10/03/2016   Medicare annual wellness visit, subsequent 06/19/2015   Health maintenance examination 06/19/2015   Advanced care planning/counseling discussion 06/19/2015   Carotid stenosis, 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 06/19/2015   Vitamin D deficiency    Family history of premature CAD 06/02/2015   Chest pain 05/26/2015   Elevated testosterone level in female 09/04/2014   Hyperlipidemia associated with type 2 diabetes mellitus ([place]) 07/30/2014   Essential hypertension 07/17/2014   Hypothyroidism due to Hashimoto's thyroiditis 07/17/2014   Adjustment disorder with mixed anxiety and depressed mood 07/17/2014   Apnea, sleep 08/22/2012   LBP (low back pain) 02/27/2012   Positive ANA (antinuclear antibody) 01/06/2012     [name] called in today to request the co-pay cost for her [place]. Per [facility] [name] can  not be filled until 11/09/20. Patient states she will be out over the weekend. Upon further review the RX in her Epic medication list has her taking 26 units and the RX [facility] has on file has her taking 20 units. Patient confirms she is taking 26 units. Current RX from [facility] was transferred from patient's previous pharmacy. New prescription of Tresiba reflecting 26 units daily has been requested from Dr. Arman Filter office. Will set up delivery for Tresiba 11/06/20. Patient also notes that she does not need Novolog at this time. She still has 3 unopened bottles at home.   Patient also mentioned having a tough time affording the [place] and Novolog. States she is on a fixed income but has been unable to qualify for any assistance in the past. Explained the patient assistance application process to her and asked if she would be interested. Patient states she will think about it and let us know.   Follow-Up:  Comptroller, Patient Assistance Coordination and Pharmacist Review  Debbora Dus, CPP notified  Margaretmary Dys, [place] [facility] Assistant [phone]  I have reviewed the care management and care coordination activities outlined in this encounter and I am certifying that I agree with the content of this note. No further action required.  Debbora Dus, PharmD Clinical Pharmacist [place] [facility] at [facility] [phone]

## 2020-11-05 NOTE — Telephone Encounter (Signed)
Prescription on file at [facility] is for [place] 20 units daily but patient is taking 26 units daily. She is running out of insulin early and insurance will not allow fill until 2/14 unless we get a new prescription. Last escript sent 10/21/20 seems to have failed to come through.  Thanks!  Debbora Dus, PharmD Clinical Pharmacist [place] [facility] at [facility] [phone]

## 2020-11-06 MED ORDER — TRESIBA FLEXTOUCH 100 UNIT/ML ~~LOC~~ SOPN: 26.0000 [IU] | PEN_INJECTOR | Freq: Every day | SUBCUTANEOUS | 1 refills | Status: DC

## 2020-11-06 NOTE — Telephone Encounter (Signed)
Rx sent to preferred pharmacy.

## 2020-11-13 ENCOUNTER — Encounter: Payer: Self-pay | Admitting: Family Medicine

## 2020-11-19 ENCOUNTER — Other Ambulatory Visit: Payer: Self-pay

## 2020-11-19 ENCOUNTER — Telehealth: Payer: Self-pay

## 2020-11-19 ENCOUNTER — Emergency Department: Payer: HMO

## 2020-11-19 ENCOUNTER — Emergency Department
Admission: EM | Admit: 2020-11-19 | Discharge: 2020-11-19 | Disposition: A | Discharge status: Home / Self Care | Payer: HMO | Attending: Emergency Medicine | Admitting: Emergency Medicine

## 2020-11-19 DIAGNOSIS — Z96641 Presence of right artificial hip joint: Secondary | ICD-10-CM | POA: Diagnosis not present

## 2020-11-19 DIAGNOSIS — E039 Hypothyroidism, unspecified: Secondary | ICD-10-CM | POA: Insufficient documentation

## 2020-11-19 DIAGNOSIS — Z7902 Long term (current) use of antithrombotics/antiplatelets: Secondary | ICD-10-CM | POA: Insufficient documentation

## 2020-11-19 DIAGNOSIS — E119 Type 2 diabetes mellitus without complications: Secondary | ICD-10-CM | POA: Diagnosis not present

## 2020-11-19 DIAGNOSIS — I10 Essential (primary) hypertension: Secondary | ICD-10-CM | POA: Diagnosis not present

## 2020-11-19 DIAGNOSIS — Z8673 Personal history of transient ischemic attack (TIA), and cerebral infarction without residual deficits: Secondary | ICD-10-CM | POA: Insufficient documentation

## 2020-11-19 DIAGNOSIS — E1165 Type 2 diabetes mellitus with hyperglycemia: Secondary | ICD-10-CM | POA: Diagnosis not present

## 2020-11-19 DIAGNOSIS — R519 Headache, unspecified: Secondary | ICD-10-CM | POA: Diagnosis not present

## 2020-11-19 DIAGNOSIS — Z79899 Other long term (current) drug therapy: Secondary | ICD-10-CM | POA: Insufficient documentation

## 2020-11-19 DIAGNOSIS — G4489 Other headache syndrome: Secondary | ICD-10-CM | POA: Diagnosis not present

## 2020-11-19 DIAGNOSIS — Z794 Long term (current) use of insulin: Secondary | ICD-10-CM | POA: Insufficient documentation

## 2020-11-19 DIAGNOSIS — R202 Paresthesia of skin: Secondary | ICD-10-CM | POA: Diagnosis not present

## 2020-11-19 LAB — CBC WITH DIFFERENTIAL/PLATELET
Abs Immature Granulocytes: 0.02 10*3/uL (ref 0.00–0.07)
Basophils Absolute: 0.1 10*3/uL (ref 0.0–0.1)
Basophils Relative: 1 %
Eosinophils Absolute: 0.1 10*3/uL (ref 0.0–0.5)
Eosinophils Relative: 1 %
HCT: 43.3 % (ref 36.0–46.0)
Hemoglobin: 14.2 g/dL (ref 12.0–15.0)
Immature Granulocytes: 0 %
Lymphocytes Relative: 33 %
Lymphs Abs: 2.5 10*3/uL (ref 0.7–4.0)
MCH: 27.3 pg (ref 26.0–34.0)
MCHC: 32.8 g/dL (ref 30.0–36.0)
MCV: 83.1 fL (ref 80.0–100.0)
Monocytes Absolute: 0.5 10*3/uL (ref 0.1–1.0)
Monocytes Relative: 7 %
Neutro Abs: 4.3 10*3/uL (ref 1.7–7.7)
Neutrophils Relative %: 58 %
Platelets: 220 10*3/uL (ref 150–400)
RBC: 5.21 MIL/uL — ABNORMAL HIGH (ref 3.87–5.11)
RDW: 13.3 % (ref 11.5–15.5)
WBC: 7.5 10*3/uL (ref 4.0–10.5)
nRBC: 0 % (ref 0.0–0.2)

## 2020-11-19 LAB — COMPREHENSIVE METABOLIC PANEL
ALT: 14 U/L (ref 0–44)
AST: 21 U/L (ref 15–41)
Albumin: 3.8 g/dL (ref 3.5–5.0)
Alkaline Phosphatase: 81 U/L (ref 38–126)
Anion gap: 10 (ref 5–15)
BUN: 12 mg/dL (ref 8–23)
CO2: 23 mmol/L (ref 22–32)
Calcium: 9.4 mg/dL (ref 8.9–10.3)
Chloride: 103 mmol/L (ref 98–111)
Creatinine, Ser: 0.79 mg/dL (ref 0.44–1.00)
GFR, Estimated: >60 mL/min (ref >60)
Glucose, Bld: 331 mg/dL — ABNORMAL HIGH (ref 70–99)
Potassium: 4.3 mmol/L (ref 3.5–5.1)
Sodium: 136 mmol/L (ref 135–145)
Total Bilirubin: 0.8 mg/dL (ref 0.3–1.2)
Total Protein: 7.2 g/dL (ref 6.5–8.1)

## 2020-11-19 LAB — SEDIMENTATION RATE: Sed Rate: 22 mm/hr (ref 0–30)

## 2020-11-19 MED ORDER — ACETAMINOPHEN 500 MG PO TABS
1000.0000 mg | ORAL_TABLET | Freq: Once | ORAL | Status: AC
  Administered 2020-11-19: 1000 mg via ORAL
  Filled 2020-11-19: qty 2

## 2020-11-19 NOTE — ED Notes (Signed)
Pt denies needs at this time.  

## 2020-11-19 NOTE — Telephone Encounter (Signed)
Noted. Will await ER eval.  

## 2020-11-19 NOTE — ED Notes (Signed)
Pt given food tray and water- told pt she was allowed to use her own insulin pen per Joni Fears MD

## 2020-11-19 NOTE — ED Provider Notes (Signed)
[facility] Emergency Department Provider Note  ____________________________________________  Time seen: Approximately 3:19 PM  I have reviewed the triage vital signs and the nursing notes.   HISTORY  Chief Complaint Headache    HPI Cassandra Benson is a 80 y.o. female with a history of diverticulitis fibromyalgia GERD diabetes who comes ED complaining of right parietal headache for the past week and a half.  No trauma.  Denies vision changes.  Extends from the back of the right side of the scalp to the right TMJ.  She does use a bite block at night.  Denies grinding or clenching her teeth this far she is aware.   She reports that the symptoms are similar to chronic headaches that she has been having for several months, for which she sees Dr. Melrose Nakayama.  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  Review of EMR shows that ESR and CRP at last neurology visit were normal.  She had a temporal artery biopsy in July 2019 which was negative.  No vision loss.  No jaw claudication.     Past Medical History:  Diagnosis Date  . Allergy   . ANA positive    positive ANA pattern 1 speckled  . Arthritis   . Carotid stenosis, asymptomatic 06/19/2015   7-20% RICA 94-70% LICA rpt 1 yr (05/6282)   . Colon polyps   . Dermatomyositis ([place])   . Diabetes mellitus without complication (HCC)    Type 1  . Diverticulosis    sigmoid on CT scan 12/2019  . Family history of adverse reaction to anesthesia    brothr went into cardiac arrest from anectine  . Fibromyalgia    prior PCP  . GERD (gastroesophageal reflux disease)    prior PCP  . Glaucoma    Narrow angle  . History of blood clots    DVT, in 20s, none since  . History of chicken pox   . History of diverticulitis   . History of pericarditis 1986   with hospitalization  . History of pneumonia 2014  . History of shingles   . History of UTI   . Hyperlipidemia   . Hypertension   . Hypothyroidism   .  Mixed connective tissue disease ([place])   . Partial small bowel obstruction ([place]) 12/2019   managed conservatively  . Peptic ulcer   . Pneumonia   . PONV (postoperative nausea and vomiting)   . Raynaud's disease without gangrene   . Shoulder pain left   h/o RTC tendonitis and adhesive capsulitis  . Sjogren's syndrome ([place])   . Sleep apnea    prior PCP - no CPAP for about 10 yrs  . 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[place])   . Vitamin D deficiency    prior PCP     Patient Active Problem List   Diagnosis Date Noted  . Cause of injury, fall 09/01/2020  . Grade II diastolic dysfunction 66/29/4765  . Chronic venous insufficiency 04/07/2020  . Bowel habit changes 02/18/2020  . Diverticulosis   . Partial small bowel obstruction ([place]) 01/15/2020  . Epistaxis 01/13/2020  . Cough 01/05/2020  . MCI (mild cognitive impairment) 10/22/2019  . Right sided abdominal pain 10/07/2019  . Skin lesion 09/14/2019  . Renal insufficiency 09/14/2019  . Pain in right buttock 06/18/2019  . Chronic midline thoracic back pain 05/14/2019  . Sjogren's syndrome without extraglandular involvement ([place]) 02/26/2019  . Epigastric discomfort 12/17/2018  . PAD (peripheral artery disease) ([place]) 10/04/2018  . Raynaud disease 09/04/2018  .  Amaurosis fugax, both eyes 06/18/2018  . TIA (transient ischemic attack) 05/21/2018  . 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 Facial paresthesia 02/07/2018  . Pain and swelling of lower leg 12/25/2017  . Fibromyalgia   . 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 Intractable episodic headache 03/13/2017  . Numbness and tingling of both lower extremities 03/13/2017  . 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 Fatty liver 02/13/2017  . LADA (latent autoimmune diabetes in adults), managed as type 1 ([place]) 10/03/2016  . Medicare annual wellness visit, subsequent 06/19/2015  . Health maintenance examination 06/19/2015  . Advanced care planning/counseling discussion  06/19/2015  . 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 Vitamin D deficiency   . Family history of premature CAD 06/02/2015  . Chest pain 05/26/2015  . 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 Hyperlipidemia associated with type 2 diabetes mellitus ([place]) 07/30/2014  . Essential hypertension 07/17/2014  . Hypothyroidism due to Hashimoto's thyroiditis 07/17/2014  . Adjustment disorder with mixed anxiety and depressed mood 07/17/2014  . Apnea, sleep 08/22/2012  . LBP (low back pain) 02/27/2012  . Positive ANA (antinuclear antibody) 01/06/2012     Past Surgical History:  Procedure Laterality Date  . ABDOMINAL HYSTERECTOMY  1978   fibroids and menorrhagia, ovaries remain  . ARTERY BIOPSY Right 04/06/2018   Procedure: BIOPSY TEMPORAL ARTERY RIGHT;  Surgeon: Beverly Gust, MD;  Location: [facility];  Service: ENT;  Laterality: Right;  Diabetic - insulin pump sleep apnea  . [facility] normal per patient  . COLONOSCOPY  10/2011   1 TA, 1 HP, very tortuous colon ([place])  . COLONOSCOPY  02/2020   TA, inflammatory polyp, mod diverticulosis, int/ext hemorrhoids ([place]) no rpt recommended   . COLONOSCOPY WITH ESOPHAGOGASTRODUODENOSCOPY (EGD)  03/2007   2 ulcers, benign polyp, rpt 5 yrs ([place], [place])  . JOINT REPLACEMENT Right    hip  . PARTIAL HIP ARTHROPLASTY  2013   Right hip replacement  . TONSILLECTOMY    . TONSILLECTOMY AND ADENOIDECTOMY    . TRANSCAROTID ARTERY REVASCULARIZATION Left 07/23/2018   Procedure: TRANSCAROTID ARTERY REVASCULARIZATION;  Surgeon: Marty Heck, MD;  Location: [facility];  Service: Vascular;  Laterality: Left;  . TUBAL LIGATION    . VAGINAL DELIVERY     x2, no complications     Prior to Admission medications   Medication Sig Start Date End Date Taking? Authorizing Provider  acetaminophen (TYLENOL) 500 MG tablet Take 1 tablet (500 mg total) by mouth 3 (three)  times daily as needed. 01/03/20   [name], MD  Ascorbic Acid (VITAMIN C) 100 MG tablet Take 100 mg by mouth daily.    [provider]  BD ULTRA-FINE LANCETS lancets Use as instructed upto 6 times daily 09/02/14   Phadke, Radhika P, MD  benzonatate (TESSALON) 100 MG capsule Take 1 capsule (100 mg total) by mouth 3 (three) times daily as needed for cough. 05/20/20   [name], MD  Cholecalciferol (VITAMIN D3) 25 MCG (1000 UT) CAPS Take 1 capsule (1,000 Units total) by mouth daily. 09/16/20   [name], MD  clopidogrel (PLAVIX) 75 MG tablet TAKE 1 TABLET(75 MG) BY MOUTH DAILY 09/10/20   Waynetta Sandy, MD  Continuous Blood Gluc Transmit (DEXCOM G6 TRANSMITTER) MISC 1 kit by Does not apply route See admin instructions. Use to check blood sugar 09/18/19   Philemon Kingdom, MD  diclofenac Sodium (VOLTAREN) 1 % GEL Apply  2 g topically 3 (three) times daily. 03/15/20   [name], MD  glucagon 1 MG injection Inject 1 mg into the muscle once as needed for up to 1 dose. 01/24/20   Philemon Kingdom, MD  glucose blood test strip Use as instructed to check glucose 4 times daily. 10/08/20   Philemon Kingdom, MD  insulin aspart (NOVOLOG) 100 UNIT/ML injection USE UP TO 50 UNITS DAILY IN THE INSULIN PUMP 11/05/20   Philemon Kingdom, MD  insulin degludec (TRESIBA FLEXTOUCH) 100 UNIT/ML FlexTouch Pen Inject 26 Units into the skin daily. 11/06/20   Philemon Kingdom, MD  Insulin Pen Needle 32G X 4 MM MISC Use 4-5x a day 10/29/20   Philemon Kingdom, MD  losartan (COZAAR) 25 MG tablet Take 1 tablet (25 mg total) by mouth daily. 09/16/20   [name], MD  metoprolol succinate (TOPROL-XL) 50 MG 24 hr tablet Take 1 tablet (50 mg total) by mouth at bedtime. Take with or immediately following a meal. 10/29/20   [name], MD  nitroGLYCERIN (NITROSTAT) 0.4 MG SL tablet Place 1 tablet (0.4 mg total) under the tongue every 5 (five) minutes as needed for chest pain.  07/31/18   [name], MD  pregabalin (LYRICA) 25 MG capsule Take 1 capsule (25 mg total) by mouth daily. 10/29/20   [name], MD  rosuvastatin (CRESTOR) 20 MG tablet Take 1 tablet (20 mg total) by mouth daily. 09/16/20   [name], MD  SYNTHROID 75 MCG tablet Take 1 tablet (75 mcg total) by mouth daily before breakfast. Don't take any on Sundays 10/29/20   [name], MD  venlafaxine [facility]) 37.5 MG tablet Take 37.5 mg once a day for a week then increase to 37.5 mg twice a day and continue that dose 09/30/20   [provider]     Allergies Iodinated diagnostic agents, Penicillins, Amlodipine, Anectine [succinylcholine], Codeine, Gabapentin, Influenza vaccines, Nortriptyline, Pamelor [nortriptyline hcl], Valsartan, Zetia [ezetimibe], Erythromycin, and Sulfa antibiotics   Family History  Problem Relation Age of Onset  . CAD Mother 34       MI, aortic valve issues  . COPD Mother   . Lupus Mother   . Graves' disease Mother   . Rheum arthritis Mother   . CAD Father 29       CABG x2, aortic valve replacement  . Stroke Sister   . CAD Sister   . Anuerysm Sister        brain  . Lupus Sister   . Diabetes Sister   . Alcohol abuse Brother   . CAD Brother 5       MI  . Stroke Brother   . Seizures Son   . COPD Brother        agent orange  . CAD Brother 42       stent  . Diabetes Brother   . Depression Grandchild   . Breast cancer Maternal Aunt   . Diabetes Sister   . Breast cancer Sister   . Breast cancer Maternal Aunt   . Stroke Maternal Grandmother   . Hypertension Maternal Grandmother   . Gallbladder disease Maternal Grandmother   . Colon cancer Neg Hx   . Esophageal cancer Neg Hx   . Rectal cancer Neg Hx   . Stomach cancer Neg Hx     Social History Social History   Tobacco Use  . Smoking status: Never Smoker  . Smokeless tobacco: Never Used  Vaping Use  . Vaping Use: Never used  Substance  Use Topics  . Alcohol use: No  . Drug  use: No    Review of Systems  Constitutional:   No fever or chills.  ENT:   No sore throat. No rhinorrhea. Cardiovascular:   No chest pain or syncope. Respiratory:   No dyspnea or cough. Gastrointestinal:   Negative for abdominal pain, vomiting and diarrhea.  Musculoskeletal:   Negative for focal pain or swelling All other systems reviewed and are negative except as documented above in ROS and HPI.  ____________________________________________   PHYSICAL EXAM:  VITAL SIGNS: ED Triage Vitals  Enc Vitals Group     BP 11/19/20 1154 (!) 156/70     Pulse Rate 11/19/20 1154 77     Resp 11/19/20 1154 18     Temp 11/19/20 1154 97.8 F (36.6 C)     Temp Source 11/19/20 1154 Oral     SpO2 11/19/20 1154 100 %     Weight 11/19/20 1156 150 lb (68 kg)     Height 11/19/20 1156 5' 3.5" (1.613 m)     Head Circumference --      Peak Flow --      Pain Score 11/19/20 1203 9     Pain Loc --      Pain Edu? --      Excl. in [place]? --     Vital signs reviewed, nursing assessments reviewed.   Constitutional:   Alert and oriented. Non-toxic appearance. Eyes:   Conjunctivae are normal. EOMI. PERRL. ENT      Head:   Normocephalic and atraumatic.      Nose:   Wearing a mask.      Mouth/Throat:   Wearing a mask.      Neck:   No meningismus. Full ROM. Hematological/Lymphatic/Immunilogical:   No cervical lymphadenopathy. Cardiovascular:   RRR. Symmetric bilateral radial and DP pulses.  No murmurs. Cap refill less than 2 seconds. Respiratory:   Normal respiratory effort without tachypnea/retractions. Breath sounds are clear and equal bilaterally. No wheezes/rales/rhonchi.  Musculoskeletal:   Normal range of motion in all extremities. No joint effusions.  No lower extremity tenderness.  No edema. Neurologic:   Normal speech and language.  Motor grossly intact. No acute focal neurologic deficits are appreciated.  Skin:    Skin is warm, dry and intact. No rash noted.  No petechiae, purpura, or  bullae.  ____________________________________________    LABS (pertinent positives/negatives) (all labs ordered are listed, but only abnormal results are displayed) Labs Reviewed  COMPREHENSIVE METABOLIC PANEL - Abnormal; Notable for the following components:      Result Value   Glucose, Bld 331 (*)    All other components within normal limits  CBC WITH DIFFERENTIAL/PLATELET - Abnormal; Notable for the following components:   RBC 5.21 (*)    All other components within normal limits  SEDIMENTATION RATE   ____________________________________________   EKG    ____________________________________________    RADIOLOGY  CT Head Wo Contrast  Result Date: 11/19/2020 CLINICAL DATA:  Headache, new or worsening. EXAM: CT HEAD WITHOUT CONTRAST TECHNIQUE: Contiguous axial images were obtained from the base of the skull through the vertex without intravenous contrast. COMPARISON:  October 21, 21. FINDINGS: Brain: No evidence of acute large vascular territory infarction, hemorrhage, hydrocephalus, extra-axial collection or mass lesion/mass effect. Similar generalized cerebral atrophy and mild patchy white matter hypoattenuation likely related to chronic microvascular ischemic disease. Vascular: No hyperdense vessel identified. Calcific atherosclerosis. Skull: No acute fracture. Sinuses/Orbits: Mild mucosal thickening of the visualized ethmoid air  cells. No visible air-fluid level. Visualized orbits are unremarkable. Other: No mastoid effusions. IMPRESSION: No evidence of acute intracranial abnormality. Electronically Signed   By: Margaretha Sheffield MD   On: 11/19/2020 14:23    ____________________________________________   PROCEDURES Procedures  ____________________________________________  DIFFERENTIAL DIAGNOSIS   Intracranial hemorrhage, chronic headache syndrome, dehydration, electrolyte abnormality  CLINICAL IMPRESSION / ASSESSMENT AND PLAN / ED COURSE  Medications ordered in the  ED: Medications  acetaminophen (TYLENOL) tablet 1,000 mg (1,000 mg Oral Given 11/19/20 1518)    Pertinent labs & imaging results that were available during my care of the patient were reviewed by me and considered in my medical decision making (see chart for details).  Cassandra Benson was evaluated in Emergency Department on 11/19/2020 for the symptoms described in the history of present illness. She was evaluated in the context of the global COVID-19 pandemic, which necessitated consideration that the patient might be at risk for infection with the SARS-CoV-2 virus that causes COVID-19. Institutional protocols and algorithms that pertain to the evaluation of patients at risk for COVID-19 are in a state of rapid change based on information released by regulatory bodies including the CDC and federal and state organizations. These policies and algorithms were followed during the patient's care in the ED.   Patient presents with recurrence of chronic headache syndrome on the right side.  No atypical features.  Neurologically intact.  CT scan of the head unremarkable.  Reviewed EMR, seeing that she had an MRI of the brain in 2018 and February 2021 which were unremarkable.  CT angiogram of the head performed in September 2019 essentially unremarkable.  No evidence of aneurysm in any radiology studies.  Labs unremarkable.  Patient given high concentration oxygen, Tylenol, will discharge home to follow-up with neurology for continued management of these chronic symptoms.      ____________________________________________   FINAL CLINICAL IMPRESSION(S) / ED DIAGNOSES    Final diagnoses:  Headache disorder  Type 2 diabetes mellitus without complication, with long-term current use of insulin [facility])     ED Discharge Orders    None      Portions of this note were generated with dragon dictation software. Dictation errors may occur despite best attempts at proofreading.   Carrie Mew,  MD 11/19/20 1525

## 2020-11-19 NOTE — ED Triage Notes (Signed)
First Nurse Note:  Arrives via EMS from home.  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  Not given any steroids.  Pain persists.  CBG:  309 (has not taken insulin yet today).  C/O pain.  Per report.. VS wnl.  AAOx3.  Skin warm and dry. NAD

## 2020-11-19 NOTE — Telephone Encounter (Signed)
[facility] - Client TELEPHONE ADVICE RECORD [facility] Patient Name: Cassandra Benson Gender: Female DOB: [birth year] Age: 80 Y 26 M 22 D Return Phone Number: [phone] (Primary), [phone] (Secondary) Address: City/State/Zip: [place] [address] Client [facility] - Client Client Site [place] - Day Physician [name] - MD Contact Type Call Who Is Calling Patient / Member / Family / Caregiver Call Type Triage / Clinical Relationship To Patient Self Return Phone Number [phone] (Primary) Chief Complaint BREATHING - shortness of breath or sounds breathless Reason for Call Symptomatic / Request for Cassandra Benson states that she has a headache and weakness, shortness of breath as well. She has an appt tomorrow at 10am Translation No Nurse Assessment Nurse: Loletha Carrow, RN, Ronalee Belts Date/Time (Eastern Time): 11/19/2020 11:05:42 AM Confirm and document reason for call. If symptomatic, describe symptoms. ---Caller states: that she has an all over severe headache and weakness, shortness of breath as well. Denies any other symptoms Does the patient have any new or worsening symptoms? ---Yes Will a triage be completed? ---Yes Related visit to physician within the last 2 weeks? ---No Does the PT have any chronic conditions? (i.e. diabetes, asthma, this includes High risk factors for pregnancy, etc.) ---Yes List chronic conditions. ---DM, shokrins, Is this a behavioral health or substance abuse call? ---No Guidelines Guideline Title Affirmed Question Affirmed Notes Nurse Date/Time (Eastern Time) Headache Sounds like a lifethreatening emergency to the triager Emch, RN, Ronalee Belts 11/19/2020 11:08:23 AM Disp. Time Eilene Ghazi Time) Disposition Final User 11/19/2020 11:02:25 AM Send to Urgent Ala Dach 11/19/2020 11:17:39 AM 911 Outcome Documentation Emch, RN, Ronalee Belts Reason: Called  Pt back after 5 min EMS on the way 11/19/2020 11:10:12 AM Call EMS 911 Now Yes Emch, RN, Ronalee Belts PLEASE NOTE: All timestamps contained within this report are represented as [place] Standard Time. CONFIDENTIALTY NOTICE: This fax transmission is intended only for the addressee. It contains information that is legally privileged, confidential or otherwise protected from use or disclosure. If you are not the intended recipient, you are strictly prohibited from reviewing, disclosing, copying using or disseminating any of this information or taking any action in reliance on or regarding this information. If you have received this fax in error, please notify us immediately by telephone so that we can arrange for its return to [place]. Phone: [phone], Toll-Free: [phone], Fax: [phone] Page: 2 of 2 Call Id: [id number] Caller Disagree/Comply Comply Caller Understands Yes PreDisposition Did not know what to do Care Advice Given Per Guideline CALL EMS 911 NOW: * Immediate medical attention is needed. You need to hang up and call 911 (or an ambulance). * Triager Discretion: I'll call you back in a few minutes to be sure you were able to reach them. CARE ADVICE given per Headache (Adult) guideline.

## 2020-11-19 NOTE — ED Triage Notes (Signed)
See first nurse note, pt intermittent facial numbness, tingling, blurred vision x 1 month. Pt denies blurred vision or numbness at this time. Headache pain 9/10.  Pt a&o in triage NAD noted.

## 2020-11-19 NOTE — Telephone Encounter (Signed)
I spoke with Cassandra Benson who is in the ambulance now on her way to ED; Cassandra Benson was very SOB when talking with me; Cassandra Benson did say to cancel appt with Dr Darnell Level on 11/20/20 (done). I told Cassandra Benson I would not keep her on the phone and hoped she felt better. Sending note to DR G as FYI.

## 2020-11-19 NOTE — ED Notes (Signed)
Signature pad in room not working- pt verbalized understanding of d/c papers

## 2020-11-19 NOTE — Discharge Instructions (Signed)
Your lab tests and CT scan today were all okay.  Continue to take all your medications as prescribed and follow-up with Dr. Melrose Nakayama for continued management of your headache symptoms.

## 2020-11-20 ENCOUNTER — Ambulatory Visit: Payer: HMO | Admitting: Family Medicine

## 2020-11-20 NOTE — Telephone Encounter (Signed)
Seen at ER for HA, treated with high flow oxygen. Please call to see how she's feeling today, offer OV next week for ER f/u

## 2020-11-23 NOTE — Telephone Encounter (Signed)
Spoke with pt asking how she is doing.  States she does not feel any better.  Says she does not feel herself.  Moving certain directions hurts.  Says it's hard to explain.  Pt has ER f/u scheduled on 12/01/20 at 10:00.

## 2020-11-30 ENCOUNTER — Other Ambulatory Visit: Payer: Self-pay | Admitting: Family Medicine

## 2020-11-30 DIAGNOSIS — Z1231 Encounter for screening mammogram for malignant neoplasm of breast: Secondary | ICD-10-CM

## 2020-12-01 ENCOUNTER — Other Ambulatory Visit: Payer: Self-pay

## 2020-12-01 ENCOUNTER — Encounter: Payer: Self-pay | Admitting: Family Medicine

## 2020-12-01 ENCOUNTER — Ambulatory Visit (INDEPENDENT_AMBULATORY_CARE_PROVIDER_SITE_OTHER): Payer: HMO | Admitting: Family Medicine

## 2020-12-01 ENCOUNTER — Ambulatory Visit (INDEPENDENT_AMBULATORY_CARE_PROVIDER_SITE_OTHER)
Admission: RE | Admit: 2020-12-01 | Discharge: 2020-12-01 | Disposition: A | Discharge status: Home / Self Care | Payer: HMO | Source: Ambulatory Visit | Attending: Family Medicine | Admitting: Family Medicine

## 2020-12-01 VITALS — BP 138/64 | HR 70 | Temp 98.0°F | Ht 63.5 in | Wt 162.4 lb

## 2020-12-01 DIAGNOSIS — R519 Headache, unspecified: Secondary | ICD-10-CM

## 2020-12-01 DIAGNOSIS — M542 Cervicalgia: Secondary | ICD-10-CM

## 2020-12-01 DIAGNOSIS — M797 Fibromyalgia: Secondary | ICD-10-CM | POA: Diagnosis not present

## 2020-12-01 DIAGNOSIS — E139 Other specified diabetes mellitus without complications: Secondary | ICD-10-CM

## 2020-12-01 NOTE — Progress Notes (Unsigned)
Patient ID: Cassandra Benson, female    DOB: 1940/10/09, 80 y.o.   MRN: [id number]  This visit was conducted in person.  BP 138/64   Pulse 70   Temp 98 F (36.7 C) (Temporal)   Ht 5' 3.5" (1.613 m)   Wt 162 lb 6 oz (73.7 kg)   SpO2 99%   BMI 28.31 kg/m    CC: ER f/u visit  Subjective:   HPI: Cassandra Benson is a 80 y.o. female presenting on 12/01/2020 for Hospitalization Follow-up (Seen on 11/19/20 at [facility] ED, dx HA disorder.  C/o HA this morning. )   Recent ER visit 11/19/2020 for chronic R headache. Notes reviewed. Seen for headache. Head CT returned without acute abnormality. Treated with tylenol and high concentration oxygen - didn't note benefit with this. Sugar was very elevated at 331.   Previous imaging including MRI brain 10/2019 and CTA 05/2018 overall reassuring. Discussed CTA with clear posterior circulation "smooth and widely patent vertebral and basilar aa".  Has f/u with Dr Melrose Nakayama for tomorrow. States neuro thinks she may be having recurrent temporal artery biopsy. She continues taking effexor 37.72m bid - finds this is sedating but has helped headaches "takes edge off" headache. Describes sharp stabbing pain starts at R vertex and travels down into face and into mastoid. No inciting trauma/injury or fall.   ?GCA diagnosis - normal ESR/CRP, normal temporal artery biopsy 03/2018.  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   LADA - upcoming appt this week with endo. No low sugars. Continues tresiba 22-26u QAM, as well as novolog TID AC according to SSI and meal coverage. She has stopped using artificial sweeteners, instead using agave.  Lab Results  Component Value Date   HGBA1C 12.0 (A) 09/04/2020        Relevant past medical, surgical, family and social history reviewed and updated as indicated. Interim medical history since our last visit reviewed. Allergies and medications reviewed and updated. Outpatient Medications Prior to Visit  Medication Sig  Dispense Refill  . acetaminophen (TYLENOL) 500 MG tablet Take 1 tablet (500 mg total) by mouth 3 (three) times daily as needed.    . Ascorbic Acid (VITAMIN C) 100 MG tablet Take 100 mg by mouth daily.    . BD ULTRA-FINE LANCETS lancets Use as instructed upto 6 times daily 600 each 2  . benzonatate (TESSALON) 100 MG capsule Take 1 capsule (100 mg total) by mouth 3 (three) times daily as needed for cough. 30 capsule 3  . Cholecalciferol (VITAMIN D3) 25 MCG (1000 UT) CAPS Take 1 capsule (1,000 Units total) by mouth daily. 30 capsule   . clopidogrel (PLAVIX) 75 MG tablet TAKE 1 TABLET(75 MG) BY MOUTH DAILY 30 tablet 6  . Continuous Blood Gluc Transmit (DEXCOM G6 TRANSMITTER) MISC 1 kit by Does not apply route See admin instructions. Use to check blood sugar 1 each 1  . diclofenac Sodium (VOLTAREN) 1 % GEL Apply 2 g topically 3 (three) times daily. 100 g 3  . glucagon 1 MG injection Inject 1 mg into the muscle once as needed for up to 1 dose. 1 each 12  . glucose blood test strip Use as instructed to check glucose 4 times daily. 400 each 3  . insulin aspart (NOVOLOG) 100 UNIT/ML injection USE UP TO 50 UNITS DAILY IN THE INSULIN PUMP 50 mL 0  . insulin degludec (TRESIBA FLEXTOUCH) 100 UNIT/ML FlexTouch Pen Inject 26 Units into the skin daily. 18 mL  1  . Insulin Pen Needle 32G X 4 MM MISC Use 4-5x a day 300 each 3  . losartan (COZAAR) 25 MG tablet Take 1 tablet (25 mg total) by mouth daily. 90 tablet 3  . metoprolol succinate (TOPROL-XL) 50 MG 24 hr tablet Take 1 tablet (50 mg total) by mouth at bedtime. Take with or immediately following a meal. 90 tablet 1  . nitroGLYCERIN (NITROSTAT) 0.4 MG SL tablet Place 1 tablet (0.4 mg total) under the tongue every 5 (five) minutes as needed for chest pain. 30 tablet 1  . rosuvastatin (CRESTOR) 20 MG tablet Take 1 tablet (20 mg total) by mouth daily. 90 tablet 3  . SYNTHROID 75 MCG tablet Take 1 tablet (75 mcg total) by mouth daily before breakfast. Don't take any  on Sundays 80 tablet 1  . venlafaxine (EFFEXOR) 37.5 MG tablet Take 37.5 mg once a day for a week then increase to 37.5 mg twice a day and continue that dose    . pregabalin (LYRICA) 25 MG capsule Take 1 capsule (25 mg total) by mouth daily. 90 capsule 1  . pregabalin (LYRICA) 25 MG capsule Take 1 capsule (25 mg total) by mouth 2 (two) times daily.     No facility-administered medications prior to visit.     Per HPI unless specifically indicated in ROS section below Review of Systems Objective:  BP 138/64   Pulse 70   Temp 98 F (36.7 C) (Temporal)   Ht 5' 3.5" (1.613 m)   Wt 162 lb 6 oz (73.7 kg)   SpO2 99%   BMI 28.31 kg/m   Wt Readings from Last 3 Encounters:  12/01/20 162 lb 6 oz (73.7 kg)  11/19/20 150 lb (68 kg)  10/21/20 159 lb 9.6 oz (72.4 kg)      Physical Exam Vitals and nursing note reviewed.  Constitutional:      Appearance: Normal appearance. She is not ill-appearing.  HENT:     Head: Normocephalic and atraumatic.  Eyes:     Extraocular Movements: Extraocular movements intact.     Conjunctiva/sclera: Conjunctivae normal.     Pupils: Pupils are equal, round, and reactive to light.  Neck:     Comments:  Point tender to palpation midline cervical spine and R paracervical mm into trapezius Limited ROM to R lateral flexion and R lateral rotation due to discomfort Cardiovascular:     Rate and Rhythm: Normal rate and regular rhythm.     Pulses: Normal pulses.     Heart sounds: Normal heart sounds. No murmur heard.   Pulmonary:     Effort: Pulmonary effort is normal. No respiratory distress.     Breath sounds: Normal breath sounds. No wheezing, rhonchi or rales.  Musculoskeletal:     Cervical back: Neck supple. Tenderness present. No rigidity.     Right lower leg: No edema.     Left lower leg: No edema.  Skin:    Findings: No rash.  Neurological:     Mental Status: She is alert.  Psychiatric:        Mood and Affect: Mood normal.        Behavior:  Behavior normal.       Results for orders placed or performed during the hospital encounter of 11/19/20  Comprehensive metabolic panel  Result Value Ref Range   Sodium 136 135 - 145 mmol/L   Potassium 4.3 3.5 - 5.1 mmol/L   Chloride 103 98 - 111 mmol/L   CO2 23 22 -  32 mmol/L   Glucose, Bld 331 (H) 70 - 99 mg/dL   BUN 12 8 - 23 mg/dL   Creatinine, Ser 0.79 0.44 - 1.00 mg/dL   Calcium 9.4 8.9 - 10.3 mg/dL   Total Protein 7.2 6.5 - 8.1 g/dL   Albumin 3.8 3.5 - 5.0 g/dL   AST 21 15 - 41 U/L   ALT 14 0 - 44 U/L   Alkaline Phosphatase 81 38 - 126 U/L   Total Bilirubin 0.8 0.3 - 1.2 mg/dL   GFR, Estimated >60 >60 mL/min   Anion gap 10 5 - 15  CBC with Differential  Result Value Ref Range   WBC 7.5 4.0 - 10.5 K/uL   RBC 5.21 (H) 3.87 - 5.11 MIL/uL   Hemoglobin 14.2 12.0 - 15.0 g/dL   HCT 43.3 36.0 - 46.0 %   MCV 83.1 80.0 - 100.0 fL   MCH 27.3 26.0 - 34.0 pg   MCHC 32.8 30.0 - 36.0 g/dL   RDW 13.3 11.5 - 15.5 %   Platelets 220 150 - 400 K/uL   nRBC 0.0 0.0 - 0.2 %   Neutrophils Relative % 58 %   Neutro Abs 4.3 1.7 - 7.7 K/uL   Lymphocytes Relative 33 %   Lymphs Abs 2.5 0.7 - 4.0 K/uL   Monocytes Relative 7 %   Monocytes Absolute 0.5 0.1 - 1.0 K/uL   Eosinophils Relative 1 %   Eosinophils Absolute 0.1 0.0 - 0.5 K/uL   Basophils Relative 1 %   Basophils Absolute 0.1 0.0 - 0.1 K/uL   Immature Granulocytes 0 %   Abs Immature Granulocytes 0.02 0.00 - 0.07 K/uL  Sedimentation rate  Result Value Ref Range   Sed Rate 22 0 - 30 mm/hr   DG Cervical Spine Complete CLINICAL DATA:  Neck pain.  EXAM: CERVICAL SPINE - COMPLETE 4+ VIEW  COMPARISON:  March 16, 2017  FINDINGS: There is straightening of the normal cervical lordotic curvature. There are degenerative changes at the C5-C6 level, similar to prior study. There is no acute compression fracture. No significant malalignment. There may be mild osseous neural foraminal narrowing bilaterally at the C5-C6 level. A left-sided  carotid artery stent is noted.  IMPRESSION: 1. No acute abnormality. 2. Degenerative changes are again noted at the C5-C6 level. 3. Straightening of the normal cervical lordotic curvature may be secondary to patient positioning or muscle spasm.  Electronically Signed   By: Constance Holster M.D.   On: 12/01/2020 22:36   Assessment & Plan:  This visit occurred during the SARS-CoV-2 public health emergency.  Safety protocols were in place, including screening questions prior to the visit, additional usage of staff PPE, and extensive cleaning of exam room while observing appropriate contact time as indicated for disinfecting solutions.   Problem List Items Addressed This Visit    LADA (latent autoimmune diabetes in adults), managed as type 1 ([place])    Sees endo, planning to restart insulin pump.  Poor control based on recent cbg's.  I wonder how much this contributes to recent malaise. Discussed with patient.       Intractable episodic headache    Ongoing R sided episodic headache followed by neurology - she endorses a headache free period after initial temporal artery biopsy. 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 Not consistent with migraines, did not respond to high flow oxygen in the ER pointing against cluster  HA.  She has started effexor through neurology.  She does endorse chronic neck pain - I wonder if cervicogenic component. Check neck xray today.       Relevant Medications   pregabalin (LYRICA) 25 MG capsule   Fibromyalgia    Anticipate mental fogginess stemming from FM - suggested trial lyrica 20m bid.       Relevant Medications   pregabalin (LYRICA) 25 MG capsule   Neck pain - Primary    Anticipate cervical arthritis component - check neck xray.  Consider muscle relaxant.       Relevant Orders   DG Cervical Spine Complete (Completed)       No  orders of the defined types were placed in this encounter.  Orders Placed This Encounter  Procedures  . DG Cervical Spine Complete    Standing Status:   Future    Number of Occurrences:   1    Standing Expiration Date:   12/01/2021    Order Specific Question:   Reason for Exam (SYMPTOM  OR DIAGNOSIS REQUIRED)    Answer:   neck pain with R sided headache, chronic    Order Specific Question:   [facility]    [facility]   Patient Instructions  Neck xrays today.  Trial lyrica 276mtwice daily.  Update [place] effect.    Follow up plan: Return if symptoms worsen or fail to improve.  JaRia BushMD

## 2020-12-01 NOTE — Patient Instructions (Addendum)
Neck xrays today.  Trial lyrica 25mg  twice daily.  Update [place] with effect.

## 2020-12-03 ENCOUNTER — Encounter: Payer: Self-pay | Admitting: Internal Medicine

## 2020-12-03 ENCOUNTER — Other Ambulatory Visit: Payer: Self-pay

## 2020-12-03 ENCOUNTER — Ambulatory Visit: Payer: HMO | Admitting: Internal Medicine

## 2020-12-03 ENCOUNTER — Encounter: Payer: Self-pay | Admitting: Family Medicine

## 2020-12-03 VITALS — BP 128/70 | HR 96 | Ht 63.5 in | Wt 161.4 lb

## 2020-12-03 DIAGNOSIS — E785 Hyperlipidemia, unspecified: Secondary | ICD-10-CM

## 2020-12-03 DIAGNOSIS — E1369 Other specified diabetes mellitus with other specified complication: Secondary | ICD-10-CM | POA: Diagnosis not present

## 2020-12-03 DIAGNOSIS — E038 Other specified hypothyroidism: Secondary | ICD-10-CM | POA: Diagnosis not present

## 2020-12-03 DIAGNOSIS — E782 Mixed hyperlipidemia: Secondary | ICD-10-CM

## 2020-12-03 DIAGNOSIS — E1169 Type 2 diabetes mellitus with other specified complication: Secondary | ICD-10-CM

## 2020-12-03 DIAGNOSIS — E139 Other specified diabetes mellitus without complications: Secondary | ICD-10-CM | POA: Diagnosis not present

## 2020-12-03 DIAGNOSIS — M542 Cervicalgia: Secondary | ICD-10-CM | POA: Insufficient documentation

## 2020-12-03 DIAGNOSIS — E063 Autoimmune thyroiditis: Secondary | ICD-10-CM | POA: Diagnosis not present

## 2020-12-03 LAB — POCT GLYCOSYLATED HEMOGLOBIN (HGB A1C): Hemoglobin A1C: 9.7 % — AB (ref 4.0–5.6)

## 2020-12-03 MED ORDER — TRESIBA FLEXTOUCH 100 UNIT/ML ~~LOC~~ SOPN: 28.0000 [IU] | PEN_INJECTOR | Freq: Every day | SUBCUTANEOUS | 1 refills | Status: DC

## 2020-12-03 MED ORDER — PEN NEEDLES 32G X 5 MM MISC: 3 refills | Status: DC

## 2020-12-03 NOTE — Progress Notes (Signed)
Patient ID: Cassandra Benson, female   DOB: Dec 19, 1940, 80 y.o.   MRN: [id number]   This visit occurred during the SARS-CoV-2 public health emergency.  Safety protocols were in place, including screening questions prior to the visit, additional usage of staff PPE, and extensive cleaning of exam room while observing appropriate contact time as indicated for disinfecting solutions.   HPI: Cassandra Benson is a 80 y.o.-year-old female, returning for f/u for LADA, initially dx'ed in 1998 (80 y/o), started insulin at dx, started insulin pump in ~2008, uncontrolled, without complications and also Hashimoto's hypothyroidism. She previously saw endocrinology at [place] (Dr. Janese Banks) and Dr Howell Rucks. Last visit with me 1.5 months ago.  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  She had negative biopsies in the past.  Not on steroids.  She is on Lyrica and Effexor.  She was in the emergency room for headaches and neck pain on 11/19/2020.  At last visit, we discussed about starting back on insulin pump but she is still on basal/bolus insulin regimen.  Reviewed HbA1c levels: Lab Results  Component Value Date   HGBA1C 12.0 (A) 09/04/2020   HGBA1C 9.3 (A) 05/25/2020   HGBA1C 8.4 (H) 01/15/2020   She continues on: - Tresiba 20-22 units in am >> off >> restarted 20 >> 24-26 units daily - Novolog: ICR: 1:8 (starches)-1:10 (other foods), but mostly using carb equivalents >> actually using 1:15 Target: 130 Insulin sensitivity factor: 20 If you correct sugars at bedtime, only correct >200 and only give 2 to 3 units. She was previously using Glucophage ER d.a.w. but had to stop because this was not covered by her insurance.  She tried the generic metformin ER and this caused diarrhea so she had to stop.  Insulin pump:  -Previously: [employer] 723-started 09/2016 (changed 07/2017), without CGM.   She was using [employer] for supplies before, but she is then forced by insurance to use [employer].   She came off the pump in the past as she had a lot of problems getting supplies from them. -then T:slim x2 - started 2020 - stopped since last OV  CGM: -Dexcom G6 >> off now  Prev. Pump settings: - basal rates: 12 am: 0.800 units/h >> 0.950 3 am: 0.900 >> 1.000 9:30 am: 0.650 >> 0.750 5 pm: 1.300 6:30 pm: 1.200 11:30 pm: 1.200 - ICR: 1:8 - target: 110 - ISF:  12 am: 20 8 pm: 30 - Active insulin time: 4h TDD from basal insulin: 52% (18 units) >> 55% (18 units) TDD from bolus insulin: 48% (17 units) >> 45% (15 units) TDD: 35 to 50 units >> 35-50 >> up to 50 units  - extended bolusing: not using - changes infusion site: q3 days  She checks her sugars 1-2 times a day  -forgot her meter at home, but per her recall: - am: 157, 171-264 >> 103, 247-378, 416 >> 172-254, 280 >> 150s (115-170) - 2h after b'fast: n/c - lunch: 188-239 >> 345 >> n/c - 2h after lunch: 292 >> n/c >> 181 >> n/c - dinner: 81, 98-180 >> 254, 260 >> 85 >> 145-160 - 2h after dinner: 128, 270 >> n/c - bedtime: 255 >> n/c Lowest sugar was 40 x 1 at night >> 103 x1 >> 85 >> 114; she has hypoglycemia awareness in the 80s.  No history of hypoglycemia admissions.  She has a glucagon kit at home. Highest sugar was 416 >> 280 >> 300s (no insulin); no history of  DKA admissions.  Pt's meals are: - Breakfast: protein drink + almond milk - Lunch: PB jelly sandwich or sandwich with ham or cream cheese sandwich - Dinner: salads  - Snacks: pretzels; pork tenderloin + veggies; chicken + tenderloin No sodas.  She stopped artificial sweeteners (sweet and low) lipids improved.  No CKD, last BUN/creatinine:  Lab Results  Component Value Date   BUN 12 11/19/2020   BUN 9 09/09/2020   CREATININE 0.79 11/19/2020   CREATININE 0.93 09/09/2020  On lisinopril. + HL;  last set of lipids: Lab Results  Component Value Date   CHOL 183 07/08/2020   HDL 53.50 07/08/2020   LDLCALC 95 07/08/2020   LDLDIRECT 113.0 09/09/2020    TRIG 175.0 (H) 07/08/2020   CHOLHDL 3 07/08/2020  On Crestor 10 -she continues to have muscle cramps despite using CoQ10.  She takes [employer].  Praluent was not covered. - last eye exam was in 10/2020: No DR, macular degeneration worse - -on Areds She previously saw an ophthalmologist in [place] and would like to establishing care and [place].  -No numbness and tingling in her feet.  She was admitted for CP + SOB 10/01/2017.  Cardiac events and PE were ruled out.  She is seeing cardiology (Dr. Rockey Situ). She had right carotid ultrasound in 2020 and there is a 39% stenosis, significantly increased since 2018.  She has a history of left carotid endarterectomy. She was started on compression hoses by [facility].  These helped. 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  Hypothyroidism: -Due to Hashimoto's thyroiditis -Positive family history of Graves' disease in her mother -She was initially on Levoxyl, but she thought Levoxyl increased her lipid levels and caused hair loss, so we changed to Synthroid.  She feels better on Synthroid.  She is currently on Synthroid 75 mcg 6 out of 7 days: - in am - fasting - at least 30 min from b'fast - no calcium - no iron - + multivitamins at night - no PPIs - not on Biotin  Reviewed her TFTs: Lab Results  Component Value Date   TSH 2.16 09/09/2020   TSH 1.73 11/27/2019   TSH 0.12 (L) 10/07/2019   TSH 0.22 (L) 09/10/2019   TSH 1.10 11/06/2018   TSH 0.09 (L) 08/29/2018   TSH 0.92 11/27/2017   TSH 0.30 (L) 10/11/2017   TSH 0.24 (L) 08/11/2017   TSH 0.58 01/04/2017   She also has a history of SLE and Sjogren's syndrome.  She has generalized muscle aches and joint pains.  In 2015, we checked her hormone levels in the setting of hirsutism and and they were normal.  Free testosterone, LH, FSH, DHEA-S, dexamethasone suppression test and estradiol were all normal.  Also, an AM  cortisol and ACTH levels were normal.  She was admitted 01/15/2020 with SBO.  She was also diagnosed with mild cognitive impairment and was started on Namenda last year.  ROS: Constitutional: no weight gain/no weight loss, no fatigue, no subjective hyperthermia, no subjective hypothermia Eyes: + blurry vision, no xerophthalmia ENT: no sore throat, no nodules palpated in neck, no dysphagia, no odynophagia, no hoarseness Cardiovascular: no CP/no SOB/no palpitations/no leg swelling Respiratory: no cough/no SOB/no wheezing Gastrointestinal: no N/no V/no D/no C/no acid reflux Musculoskeletal: + muscle aches/+ joint aches Skin: no rashes, no hair loss Neurological: no tremors/no numbness/no tingling/no dizziness, ++ HAs  I reviewed pt's medications, allergies, PMH, social hx, family hx, and changes were  documented in the history of present illness. Otherwise, unchanged from my initial visit note.  Past Medical History:  Diagnosis Date  . Allergy   . ANA positive    positive ANA pattern 1 speckled  . Arthritis   . Carotid stenosis, asymptomatic 06/19/2015   5-00% RICA 93-81% LICA rpt 1 yr (04/2992)   . Colon polyps   . Dermatomyositis ([place])   . Diabetes mellitus without complication (HCC)    Type 1  . Diverticulosis    sigmoid on CT scan 12/2019  . Family history of adverse reaction to anesthesia    brothr went into cardiac arrest from anectine  . Fibromyalgia    prior PCP  . GERD (gastroesophageal reflux disease)    prior PCP  . Glaucoma    Narrow angle  . History of blood clots    DVT, in 20s, none since  . History of chicken pox   . History of diverticulitis   . History of pericarditis 1986   with hospitalization  . History of pneumonia 2014  . History of shingles   . History of UTI   . Hyperlipidemia   . Hypertension   . Hypothyroidism   . Mixed connective tissue disease ([place])   . Partial small bowel obstruction ([place]) 12/2019   managed conservatively  . Peptic ulcer    . Pneumonia   . PONV (postoperative nausea and vomiting)   . Raynaud's disease without gangrene   . Shoulder pain left   h/o RTC tendonitis and adhesive capsulitis  . Sjogren's syndrome ([place])   . Sleep apnea    prior PCP - no CPAP for about 10 yrs  . 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[place])   . Vitamin D deficiency    prior PCP   Past Surgical History:  Procedure Laterality Date  . ABDOMINAL HYSTERECTOMY  1978   fibroids and menorrhagia, ovaries remain  . ARTERY BIOPSY Right 04/06/2018   Procedure: BIOPSY TEMPORAL ARTERY RIGHT;  Surgeon: Beverly Gust, MD;  Location: [facility];  Service: ENT;  Laterality: Right;  Diabetic - insulin pump sleep apnea  . [facility] normal per patient  . COLONOSCOPY  10/2011   1 TA, 1 HP, very tortuous colon ([place])  . COLONOSCOPY  02/2020   TA, inflammatory polyp, mod diverticulosis, int/ext hemorrhoids ([place]) no rpt recommended   . COLONOSCOPY WITH ESOPHAGOGASTRODUODENOSCOPY (EGD)  03/2007   2 ulcers, benign polyp, rpt 5 yrs ([place], [place])  . JOINT REPLACEMENT Right    hip  . PARTIAL HIP ARTHROPLASTY  2013   Right hip replacement  . TONSILLECTOMY    . TONSILLECTOMY AND ADENOIDECTOMY    . TRANSCAROTID ARTERY REVASCULARIZATION Left 07/23/2018   Procedure: TRANSCAROTID ARTERY REVASCULARIZATION;  Surgeon: Marty Heck, MD;  Location: [facility];  Service: Vascular;  Laterality: Left;  . TUBAL LIGATION    . VAGINAL DELIVERY     x2, no complications   Social History   Social History  . Widowed   . Number of children: 2   Occupational History  Retired    Social History Main Topics  . Smoking status: Never Smoker  . Smokeless tobacco: Never Used  . Alcohol use No  . Drug use: No   Social History Narrative   Lives in [place] now - moved from [place].   No pets.   Mother of Taygan Connell.   Grandson committed suicide in [place]    Work - retired, prior Production manager  Hobbies -  works with her church, [employer]   Exercise - limited   Diet - good water, fruits/vegetables daily, limited meat, protein drink every morning   Current Outpatient Medications on File Prior to Visit  Medication Sig Dispense Refill  . acetaminophen (TYLENOL) 500 MG tablet Take 1 tablet (500 mg total) by mouth 3 (three) times daily as needed.    . Ascorbic Acid (VITAMIN C) 100 MG tablet Take 100 mg by mouth daily.    . BD ULTRA-FINE LANCETS lancets Use as instructed upto 6 times daily 600 each 2  . benzonatate (TESSALON) 100 MG capsule Take 1 capsule (100 mg total) by mouth 3 (three) times daily as needed for cough. 30 capsule 3  . Cholecalciferol (VITAMIN D3) 25 MCG (1000 UT) CAPS Take 1 capsule (1,000 Units total) by mouth daily. 30 capsule   . clopidogrel (PLAVIX) 75 MG tablet TAKE 1 TABLET(75 MG) BY MOUTH DAILY 30 tablet 6  . Continuous Blood Gluc Transmit (DEXCOM G6 TRANSMITTER) MISC 1 kit by Does not apply route See admin instructions. Use to check blood sugar 1 each 1  . diclofenac Sodium (VOLTAREN) 1 % GEL Apply 2 g topically 3 (three) times daily. 100 g 3  . glucagon 1 MG injection Inject 1 mg into the muscle once as needed for up to 1 dose. 1 each 12  . glucose blood test strip Use as instructed to check glucose 4 times daily. 400 each 3  . insulin aspart (NOVOLOG) 100 UNIT/ML injection USE UP TO 50 UNITS DAILY IN THE INSULIN PUMP 50 mL 0  . insulin degludec (TRESIBA FLEXTOUCH) 100 UNIT/ML FlexTouch Pen Inject 26 Units into the skin daily. 18 mL 1  . Insulin Pen Needle 32G X 4 MM MISC Use 4-5x a day 300 each 3  . losartan (COZAAR) 25 MG tablet Take 1 tablet (25 mg total) by mouth daily. 90 tablet 3  . metoprolol succinate (TOPROL-XL) 50 MG 24 hr tablet Take 1 tablet (50 mg total) by mouth at bedtime. Take with or immediately following a meal. 90 tablet 1  . nitroGLYCERIN (NITROSTAT) 0.4 MG SL tablet Place 1 tablet (0.4 mg total) under the tongue every 5 (five) minutes as needed for  chest pain. 30 tablet 1  . pregabalin (LYRICA) 25 MG capsule Take 1 capsule (25 mg total) by mouth 2 (two) times daily.    . rosuvastatin (CRESTOR) 20 MG tablet Take 1 tablet (20 mg total) by mouth daily. 90 tablet 3  . SYNTHROID 75 MCG tablet Take 1 tablet (75 mcg total) by mouth daily before breakfast. Don't take any on Sundays 80 tablet 1  . venlafaxine (EFFEXOR) 37.5 MG tablet Take 37.5 mg once a day for a week then increase to 37.5 mg twice a day and continue that dose     No current facility-administered medications on file prior to visit.   Allergies  Allergen Reactions  . Iodinated Diagnostic Agents Other (See Comments)    Itching (severe) and chest tightness  . Penicillins Anaphylaxis, Swelling, Rash and Other (See Comments)    Has patient had a PCN reaction causing immediate rash, facial/tongue/throat swelling, SOB or lightheadedness with hypotension: Yes Has patient had a PCN reaction causing severe rash involving mucus membranes or skin necrosis: Unknown Has patient had a PCN reaction that required hospitalization: Unknown Has patient had a PCN reaction occurring within the last 10 years: No If all of the above answers are "NO", then may proceed with Cephalosporin use.   Marland Kitchen  Amlodipine Swelling    Pedal edema  . Anectine [Succinylcholine] Other (See Comments)    Brother went into cardiac arrest.  . Codeine Nausea Only  . Gabapentin Other (See Comments)    Gait abnormality  . Influenza Vaccines Other (See Comments)    Muscle weakness; unable to walk  . Nortriptyline Other (See Comments)    Eye swelling and mouth drawed up  . Pamelor [Nortriptyline Hcl] Other (See Comments)    Patient states caused her face to draw in together.  . Valsartan Other (See Comments) and Cough    Allergy to generic only, "Hacking" cough  . Zetia [Ezetimibe] Other (See Comments)    Bad muscle cramps  . Erythromycin Rash and Swelling  . Sulfa Antibiotics Rash   Family History  Problem Relation  Age of Onset  . CAD Mother 40       MI, aortic valve issues  . COPD Mother   . Lupus Mother   . Graves' disease Mother   . Rheum arthritis Mother   . CAD Father 27       CABG x2, aortic valve replacement  . Stroke Sister   . CAD Sister   . Anuerysm Sister        brain  . Lupus Sister   . Diabetes Sister   . Alcohol abuse Brother   . CAD Brother 11       MI  . Stroke Brother   . Seizures Son   . COPD Brother        agent orange  . CAD Brother 85       stent  . Diabetes Brother   . Depression Grandchild   . Breast cancer Maternal Aunt   . Diabetes Sister   . Breast cancer Sister   . Breast cancer Maternal Aunt   . Stroke Maternal Grandmother   . Hypertension Maternal Grandmother   . Gallbladder disease Maternal Grandmother   . Colon cancer Neg Hx   . Esophageal cancer Neg Hx   . Rectal cancer Neg Hx   . Stomach cancer Neg Hx    PE: BP 128/70 (BP Location: Right Arm, Patient Position: Sitting, Cuff Size: Normal)   Pulse 96   Ht 5' 3.5" (1.613 m)   Wt 161 lb 6.4 oz (73.2 kg)   SpO2 96%   BMI 28.14 kg/m  Wt Readings from Last 3 Encounters:  12/03/20 161 lb 6.4 oz (73.2 kg)  12/01/20 162 lb 6 oz (73.7 kg)  11/19/20 150 lb (68 kg)   Constitutional: Normal weight, in NAD Eyes: PERRLA, EOMI, no exophthalmos ENT: moist mucous membranes, + palpable thyroid, no cervical lymphadenopathy Cardiovascular: RRR, No MRG Respiratory: CTA B Gastrointestinal: abdomen soft, NT, ND, BS+ Musculoskeletal: no deformities, strength intact in all 4 Skin: moist, warm, no rashes, + acne on face-patient attributes this to NovoLog Neurological: no tremor with outstretched hands, DTR normal in all 4  ASSESSMENT: 1. DM1, uncontrolled, without long-term complications, but with hyperglycemia  2.  Hashimoto's hypothyroidism  3. HL  PLAN:  1. Patient with uncontrolled LADA, on basal-bolus insulin regimen, with slightly better blood sugars at last visit after addition of long-acting  insulin.  She was off [place] after coming off her insulin pump and only using NovoLog.  Sugars were at baseline, in the 200s to 400s range, and then HbA1c was extremely high, at 12.0%.  At last visit, sugars were still high in the morning and we increase the Tresiba dose.  We did not  change her NovoLog regimen especially as we discussed about going back on the insulin pump.  I gave her again the contact information for the [employer] representative.  However, at this visit, she continues on basal-bolus insulin regimen.  She has yet to call the [employer] rep. -At today's visit, her sugars have improved but her recall (she is about her meter at home).  They are still above target in the morning and before dinner, though, so I advised her to increase the Tresiba dose slightly.  Otherwise, we will continue the same doses of NovoLog.  We again discussed about starting on an insulin pump, which I would support for her.  She will call the [employer] rep. - I advised her to:  Patient Instructions  Please increase: - Tresiba 28-30 units in am   Please continue: - Novolog: ICR: 1:15 Target: 130 Insulin sensitivity factor: 20 130-150: +1 unit 151-170: +2 units 171-190: +3 units 191-210: +4 units 211-230: +5 units 231-250: +6 units >250: +7 units If you correct sugars at bedtime, only correct >200 and only give 2 to 3 units.  Please contact the [employer] rep, Deberah Castle.  Please return in 3 months with your sugar log.    - we checked her HbA1c: 9.7% (better) - advised to check sugars at different times of the day - 4x a day, rotating check times - advised for yearly eye exams >> she is UTD - return to clinic in 3 months  2.  Hashimoto's hypothyroidism - she does have a palpable thyroid, most likely due to inflammation in the setting of Hashimoto's thyroiditis. - latest thyroid labs reviewed with pt >> normal: Lab Results  Component Value Date   TSH 2.16 09/09/2020   - she continues on LT4 75  mcg 6 out of 7 days - pt feels good on this dose. - we discussed about taking the thyroid hormone every day, with water, >30 minutes before breakfast, separated by >4 hours from acid reflux medications, calcium, iron, multivitamins. Pt. is taking it correctly.  3. HL -Reviewed latest lipid panel from 06/2020: LDL at goal, triglycerides slightly high: Lab Results  Component Value Date   CHOL 183 07/08/2020   HDL 53.50 07/08/2020   LDLCALC 95 07/08/2020   LDLDIRECT 113.0 09/09/2020   TRIG 175.0 (H) 07/08/2020   CHOLHDL 3 07/08/2020  -She continues on Crestor 10 mg daily.  She does have generalized muscle aches, but Dr. Harlow Asa from the statin.  Philemon Kingdom, MD PhD [facility]

## 2020-12-03 NOTE — Assessment & Plan Note (Signed)
Anticipate mental fogginess stemming from FM - suggested trial lyrica 25mg  bid.

## 2020-12-03 NOTE — Assessment & Plan Note (Signed)
Sees endo, planning to restart insulin pump.  Poor control based on recent cbg's.  I wonder how much this contributes to recent malaise. Discussed with patient.

## 2020-12-03 NOTE — Assessment & Plan Note (Signed)
Ongoing R sided episodic headache followed by neurology - she endorses a headache free period after initial temporal artery biopsy. 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 Not consistent with migraines, did not respond to high flow oxygen in the ER pointing against cluster HA.  She has started effexor through neurology.  She does endorse chronic neck pain - I wonder if cervicogenic component. Check neck xray today.

## 2020-12-03 NOTE — Assessment & Plan Note (Signed)
Anticipate cervical arthritis component - check neck xray.  Consider muscle relaxant.

## 2020-12-03 NOTE — Patient Instructions (Addendum)
Please increase: - Tresiba 28-30 units in am   Please continue: - Novolog: ICR: 1:15 Target: 130 Insulin sensitivity factor: 20 130-150: +1 unit 151-170: +2 units 171-190: +3 units 191-210: +4 units 211-230: +5 units 231-250: +6 units >250: +7 units If you correct sugars at bedtime, only correct >200 and only give 2 to 3 units.  Please contact the [employer] rep, Deberah Castle.  Please return in 3 months with your sugar log.

## 2020-12-04 ENCOUNTER — Observation Stay
Admission: EM | Admit: 2020-12-04 | Discharge: 2020-12-05 | Disposition: A | Discharge status: Home / Self Care | Payer: HMO | Attending: Internal Medicine | Admitting: Internal Medicine

## 2020-12-04 ENCOUNTER — Other Ambulatory Visit: Payer: Self-pay

## 2020-12-04 DIAGNOSIS — R079 Chest pain, unspecified: Secondary | ICD-10-CM | POA: Diagnosis present

## 2020-12-04 DIAGNOSIS — Z96641 Presence of right artificial hip joint: Secondary | ICD-10-CM | POA: Diagnosis not present

## 2020-12-04 DIAGNOSIS — R0789 Other chest pain: Secondary | ICD-10-CM | POA: Diagnosis not present

## 2020-12-04 DIAGNOSIS — Z794 Long term (current) use of insulin: Secondary | ICD-10-CM | POA: Insufficient documentation

## 2020-12-04 DIAGNOSIS — E785 Hyperlipidemia, unspecified: Secondary | ICD-10-CM | POA: Diagnosis present

## 2020-12-04 DIAGNOSIS — E109 Type 1 diabetes mellitus without complications: Secondary | ICD-10-CM | POA: Insufficient documentation

## 2020-12-04 DIAGNOSIS — I10 Essential (primary) hypertension: Secondary | ICD-10-CM | POA: Diagnosis present

## 2020-12-04 DIAGNOSIS — Z7984 Long term (current) use of oral hypoglycemic drugs: Secondary | ICD-10-CM | POA: Insufficient documentation

## 2020-12-04 DIAGNOSIS — R0602 Shortness of breath: Secondary | ICD-10-CM | POA: Diagnosis not present

## 2020-12-04 DIAGNOSIS — Z79899 Other long term (current) drug therapy: Secondary | ICD-10-CM | POA: Diagnosis not present

## 2020-12-04 DIAGNOSIS — E1169 Type 2 diabetes mellitus with other specified complication: Secondary | ICD-10-CM | POA: Diagnosis present

## 2020-12-04 DIAGNOSIS — Z20822 Contact with and (suspected) exposure to covid-19: Secondary | ICD-10-CM | POA: Insufficient documentation

## 2020-12-04 DIAGNOSIS — G8929 Other chronic pain: Secondary | ICD-10-CM | POA: Diagnosis present

## 2020-12-04 DIAGNOSIS — E039 Hypothyroidism, unspecified: Secondary | ICD-10-CM | POA: Diagnosis not present

## 2020-12-04 DIAGNOSIS — Z86718 Personal history of other venous thrombosis and embolism: Secondary | ICD-10-CM | POA: Diagnosis not present

## 2020-12-04 DIAGNOSIS — M79661 Pain in right lower leg: Secondary | ICD-10-CM | POA: Diagnosis not present

## 2020-12-04 DIAGNOSIS — R531 Weakness: Secondary | ICD-10-CM | POA: Diagnosis not present

## 2020-12-04 LAB — CBC
HCT: 41.1 % (ref 36.0–46.0)
Hemoglobin: 13 g/dL (ref 12.0–15.0)
MCH: 26.8 pg (ref 26.0–34.0)
MCHC: 31.6 g/dL (ref 30.0–36.0)
MCV: 84.7 fL (ref 80.0–100.0)
Platelets: 214 10*3/uL (ref 150–400)
RBC: 4.85 MIL/uL (ref 3.87–5.11)
RDW: 13.6 % (ref 11.5–15.5)
WBC: 10.2 10*3/uL (ref 4.0–10.5)
nRBC: 0 % (ref 0.0–0.2)

## 2020-12-04 LAB — BASIC METABOLIC PANEL
Anion gap: 7 (ref 5–15)
BUN: 11 mg/dL (ref 8–23)
CO2: 24 mmol/L (ref 22–32)
Calcium: 9.3 mg/dL (ref 8.9–10.3)
Chloride: 106 mmol/L (ref 98–111)
Creatinine, Ser: 0.86 mg/dL (ref 0.44–1.00)
GFR, Estimated: >60 mL/min (ref >60)
Glucose, Bld: 126 mg/dL — ABNORMAL HIGH (ref 70–99)
Potassium: 4.1 mmol/L (ref 3.5–5.1)
Sodium: 137 mmol/L (ref 135–145)

## 2020-12-04 LAB — TROPONIN I (HIGH SENSITIVITY): Troponin I (High Sensitivity): 3 ng/L (ref <18)

## 2020-12-04 MED ORDER — ASPIRIN 81 MG PO CHEW
324.0000 mg | CHEWABLE_TABLET | Freq: Once | ORAL | Status: AC
  Administered 2020-12-05: 324 mg via ORAL
  Filled 2020-12-04: qty 4

## 2020-12-04 MED ORDER — NITROGLYCERIN 0.4 MG SL SUBL
0.4000 mg | SUBLINGUAL_TABLET | SUBLINGUAL | Status: DC | PRN
  Administered 2020-12-05: 0.4 mg via SUBLINGUAL
  Filled 2020-12-04: qty 1

## 2020-12-04 NOTE — ED Triage Notes (Addendum)
Pt states stood up today and began to experience sharp left sided chest pain under breast with weakness and shob. Pt was seen at urgent care for same today and was sent by urgent care.  pt may have had a cxr at urgent care.

## 2020-12-04 NOTE — ED Notes (Signed)
Pt presents via triage for c/o intermittent chest pain with exertion since waking at 0530 today. Pt reports SOB and light headedness with chest pain. Pt reports pain slightly improved with rest. Pt also sts pain waxes and wanes in severity, from dull to sharp.

## 2020-12-04 NOTE — ED Provider Notes (Addendum)
[facility] Emergency Department Provider Note  ____________________________________________   Event Date/Time   First MD Initiated Contact with Patient 12/04/20 2300     (approximate)  I have reviewed the triage vital signs and the nursing notes.   HISTORY  Chief Complaint Chest Pain    HPI Cassandra Benson is a 80 y.o. female with history of carotid stenosis status post LEFT stent on Plavix, hypertension, diabetes, hyperlipidemia, Sjogren's, fibromyalgia, previous DVT no longer on anticoagulation, grade 2 diastolic dysfunction who presents to the emergency department with left-sided chest pain that started yesterday morning.  States she got up from sleep and started her normal routine and had severe left-sided chest pain underneath her breast that she describes as a tightness, pressure but also a sharp pain.  It is intermittent in nature.  She states pain was worse with exertion but also with movement and deep inspiration.  States symptoms became worse when she was folding her laundry.  No injury to her chest that she can recall.  She had associated shortness of breath and lightheadedness with some vertigo.  She denies fever or productive cough.  She reports she has had some pain and swelling in the right calf that has been ongoing for weeks.  She does report several weeks ago having some pain in the left groin that has resolved.  She did have a DVT when she was in her 39s.  States she is no longer on anticoagulation just antiplatelets at this time. She states she felt very weak with this episode and symptoms of generalized weakness have worsened.  She states her last cardiac catheterization was approximately 10 years ago at [facility].  Per her cardiologist note she had a prior cardiac catheterization in 2000 with no significant disease.  She did have atherosclerosis noted to her aorta previously on CT scan in 2016.  Last stress test was in July 2020 that was low risk  with good perfusion.  Her cardiologist is Dr. Rockey Situ.  PCP is Dr. Danise Mina.  States [facility].  They sent her here for further evaluation.        Past Medical History:  Diagnosis Date  . Allergy   . ANA positive    positive ANA pattern 1 speckled  . Arthritis   . Carotid stenosis, asymptomatic 06/19/2015   0-10% RICA 27-25% LICA rpt 1 yr (11/6642)   . Colon polyps   . Dermatomyositis ([place])   . Diabetes mellitus without complication (HCC)    Type 1  . Diverticulosis    sigmoid on CT scan 12/2019  . Family history of adverse reaction to anesthesia    brothr went into cardiac arrest from anectine  . Fibromyalgia    prior PCP  . GERD (gastroesophageal reflux disease)    prior PCP  . Glaucoma    Narrow angle  . History of blood clots    DVT, in 20s, none since  . History of chicken pox   . History of diverticulitis   . History of pericarditis 1986   with hospitalization  . History of pneumonia 2014  . History of shingles   . History of UTI   . Hyperlipidemia   . Hypertension   . Hypothyroidism   . Mixed connective tissue disease ([place])   . Partial small bowel obstruction ([place]) 12/2019   managed conservatively  . Peptic ulcer   . Pneumonia   . PONV (postoperative nausea and  vomiting)   . Raynaud's disease without gangrene   . Shoulder pain left   h/o RTC tendonitis and adhesive capsulitis  . Sjogren's syndrome ([place])   . Sleep apnea    prior PCP - no CPAP for about 10 yrs  . 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[place])   . Vitamin D deficiency    prior PCP    Patient Active Problem List   Diagnosis Date Noted  . Neck pain 12/03/2020  . Cause of injury, fall 09/01/2020  . Grade II diastolic dysfunction 64/40/3474  . Chronic venous insufficiency 04/07/2020  . Bowel habit changes 02/18/2020  . Diverticulosis   . Partial small bowel obstruction ([place]) 01/15/2020  . Epistaxis 01/13/2020  . Cough 01/05/2020  . Cognitive changes 10/22/2019   . Right sided abdominal pain 10/07/2019  . Skin lesion 09/14/2019  . Renal insufficiency 09/14/2019  . Pain in right buttock 06/18/2019  . Chronic midline thoracic back pain 05/14/2019  . Sjogren's syndrome without extraglandular involvement ([place]) 02/26/2019  . Epigastric discomfort 12/17/2018  . PAD (peripheral artery disease) ([place]) 10/04/2018  . Raynaud disease 09/04/2018  . Amaurosis fugax, both eyes 06/18/2018  . TIA (transient ischemic attack) 05/21/2018  . 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 Facial paresthesia 02/07/2018  . Pain and swelling of lower leg 12/25/2017  . Fibromyalgia   . 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 Intractable episodic headache 03/13/2017  . Numbness and tingling of both lower extremities 03/13/2017  . 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 Fatty liver 02/13/2017  . LADA (latent autoimmune diabetes in adults), managed as type 1 ([place]) 10/03/2016  . Medicare annual wellness visit, subsequent 06/19/2015  . Health maintenance examination 06/19/2015  . Advanced care planning/counseling discussion 06/19/2015  . 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 Vitamin D deficiency   . Family history of premature CAD 06/02/2015  . Chest pain 05/26/2015  . 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 Hyperlipidemia associated with type 2 diabetes mellitus ([name]) 07/30/2014  . Essential hypertension 07/17/2014  . Hypothyroidism due to Hashimoto's thyroiditis 07/17/2014  . Adjustment disorder with mixed anxiety and depressed mood 07/17/2014  . Apnea, sleep 08/22/2012  . LBP (low back pain) 02/27/2012  . Positive ANA (antinuclear antibody) 01/06/2012    Past Surgical History:  Procedure Laterality Date  . ABDOMINAL HYSTERECTOMY  1978   fibroids and menorrhagia, ovaries remain  . ARTERY BIOPSY Right 04/06/2018   Procedure: BIOPSY TEMPORAL ARTERY RIGHT;  Surgeon: Beverly Gust, MD;  Location: [place];  Service: ENT;  Laterality: Right;  Diabetic - insulin pump sleep apnea  . [facility] normal per patient  . COLONOSCOPY  10/2011   1 TA, 1 HP, very tortuous colon ([place])  . COLONOSCOPY  02/2020   TA, inflammatory polyp, mod diverticulosis, int/ext hemorrhoids ([place]) no rpt recommended   . COLONOSCOPY WITH ESOPHAGOGASTRODUODENOSCOPY (EGD)  03/2007   2 ulcers, benign polyp, rpt 5 yrs ([place], [place])  . JOINT REPLACEMENT Right    hip  . PARTIAL HIP ARTHROPLASTY  2013   Right hip replacement  . TONSILLECTOMY    . TONSILLECTOMY AND ADENOIDECTOMY    . TRANSCAROTID ARTERY REVASCULARIZATION Left 07/23/2018   Procedure: TRANSCAROTID ARTERY REVASCULARIZATION;  Surgeon: Marty Heck, MD;  Location: [place];  Service: Vascular;  Laterality: Left;  . TUBAL LIGATION    . VAGINAL DELIVERY     x2, no complications    Prior to Admission  medications   Medication Sig Start Date End Date Taking? Authorizing Provider  acetaminophen (TYLENOL) 500 MG tablet Take 1 tablet (500 mg total) by mouth 3 (three) times daily as needed. 01/03/20   [name], MD  Ascorbic Acid (VITAMIN C) 100 MG tablet Take 100 mg by mouth daily.    [provider]  BD ULTRA-FINE LANCETS lancets Use as instructed upto 6 times daily 09/02/14   Phadke, Radhika P, MD  benzonatate (TESSALON) 100 MG capsule Take 1 capsule (100 mg total) by mouth 3 (three) times daily as needed for cough. 05/20/20   [name], MD  Cholecalciferol (VITAMIN D3) 25 MCG (1000 UT) CAPS Take 1 capsule (1,000 Units total) by mouth daily. 09/16/20   [name], MD  clopidogrel (PLAVIX) 75 MG tablet TAKE 1 TABLET(75 MG) BY MOUTH DAILY 09/10/20   Waynetta Sandy, MD  Continuous Blood Gluc Transmit (DEXCOM G6 TRANSMITTER) MISC 1 kit by Does not apply route See admin instructions. Use to check blood sugar 09/18/19   Philemon Kingdom, MD  diclofenac Sodium  (VOLTAREN) 1 % GEL Apply 2 g topically 3 (three) times daily. 03/15/20   [name], MD  glucagon 1 MG injection Inject 1 mg into the muscle once as needed for up to 1 dose. 01/24/20   Philemon Kingdom, MD  glucose blood test strip Use as instructed to check glucose 4 times daily. 10/08/20   Philemon Kingdom, MD  insulin aspart (NOVOLOG) 100 UNIT/ML injection USE UP TO 50 UNITS DAILY IN THE INSULIN PUMP 11/05/20   Philemon Kingdom, MD  insulin degludec (TRESIBA FLEXTOUCH) 100 UNIT/ML FlexTouch Pen Inject 28-30 Units into the skin daily. 12/03/20   Philemon Kingdom, MD  Insulin Pen Needle (PEN NEEDLES) 32G X 5 MM MISC Use 1x a day 12/03/20   Philemon Kingdom, MD  losartan (COZAAR) 25 MG tablet Take 1 tablet (25 mg total) by mouth daily. 09/16/20   [name], MD  metoprolol succinate (TOPROL-XL) 50 MG 24 hr tablet Take 1 tablet (50 mg total) by mouth at bedtime. Take with or immediately following a meal. 10/29/20   [name], MD  nitroGLYCERIN (NITROSTAT) 0.4 MG SL tablet Place 1 tablet (0.4 mg total) under the tongue every 5 (five) minutes as needed for chest pain. 07/31/18   [name], MD  pregabalin (LYRICA) 25 MG capsule Take 1 capsule (25 mg total) by mouth 2 (two) times daily. 12/01/20   [name], MD  rosuvastatin (CRESTOR) 20 MG tablet Take 1 tablet (20 mg total) by mouth daily. 09/16/20   [name], MD  SYNTHROID 75 MCG tablet Take 1 tablet (75 mcg total) by mouth daily before breakfast. Don't take any on Sundays 10/29/20   [name], MD  venlafaxine [facility]) 37.5 MG tablet Take 37.5 mg once a day for a week then increase to 37.5 mg twice a day and continue that dose 09/30/20   [provider]    Allergies Iodinated diagnostic agents, Penicillins, Amlodipine, Anectine [succinylcholine], Codeine, Gabapentin, Influenza vaccines, Nortriptyline, Pamelor [nortriptyline hcl], Valsartan, Zetia [ezetimibe], Erythromycin, and Sulfa  antibiotics  Family History  Problem Relation Age of Onset  . CAD Mother 6       MI, aortic valve issues  . COPD Mother   . Lupus Mother   . Graves' disease Mother   . Rheum arthritis Mother   . CAD Father 33       CABG x2, aortic valve replacement  . Stroke Sister   . CAD Sister   .  Anuerysm Sister        brain  . Lupus Sister   . Diabetes Sister   . Alcohol abuse Brother   . CAD Brother 33       MI  . Stroke Brother   . Seizures Son   . COPD Brother        agent orange  . CAD Brother 33       stent  . Diabetes Brother   . Depression Grandchild   . Breast cancer Maternal Aunt   . Diabetes Sister   . Breast cancer Sister   . Breast cancer Maternal Aunt   . Stroke Maternal Grandmother   . Hypertension Maternal Grandmother   . Gallbladder disease Maternal Grandmother   . Colon cancer Neg Hx   . Esophageal cancer Neg Hx   . Rectal cancer Neg Hx   . Stomach cancer Neg Hx     Social History Social History   Tobacco Use  . Smoking status: Never Smoker  . Smokeless tobacco: Never Used  Vaping Use  . Vaping Use: Never used  Substance Use Topics  . Alcohol use: No  . Drug use: No    Review of Systems Constitutional: No fever. Eyes: No visual changes. ENT: No sore throat. Cardiovascular: + chest pain. Respiratory: + shortness of breath. Gastrointestinal: No nausea, vomiting, diarrhea. Genitourinary: Negative for dysuria. Musculoskeletal: Negative for back pain. Skin: Negative for rash. Neurological: Negative for focal weakness or numbness.  ____________________________________________   PHYSICAL EXAM:  VITAL SIGNS: ED Triage Vitals  Enc Vitals Group     BP 12/04/20 2143 140/63     Pulse Rate 12/04/20 2143 65     Resp 12/04/20 2143 16     Temp 12/04/20 2211 98.2 F (36.8 C)     Temp Source 12/04/20 2143 Oral     SpO2 12/04/20 2143 96 %     Weight 12/04/20 2144 161 lb (73 kg)     Height 12/04/20 2144 '5\' 3"'  (1.6 m)     Head Circumference --       Peak Flow --      Pain Score 12/04/20 2144 6     Pain Loc --      Pain Edu? --      Excl. in [place]? --    CONSTITUTIONAL: Alert and oriented and responds appropriately to questions. Well-appearing; well-nourished HEAD: Normocephalic EYES: Conjunctivae clear, pupils appear equal, EOM appear intact ENT: normal nose; moist mucous membranes NECK: Supple, normal ROM CARD: RRR; S1 and S2 appreciated; no murmurs, no clicks, no rubs, no gallops RESP: Normal chest excursion without splinting or tachypnea; breath sounds clear and equal bilaterally; no wheezes, no rhonchi, no rales, no hypoxia or respiratory distress, speaking full sentences ABD/GI: Normal bowel sounds; non-distended; soft, non-tender, no rebound, no guarding, no peritoneal signs, no hepatosplenomegaly BACK: The back appears normal EXT: Normal ROM in all joints; no deformity noted, no edema; no cyanosis, tender to palpation in the right posterior calf without swelling or asymmetry noted, extremities warm and well-perfused; varicose veins noted without redness or warmth SKIN: Normal color for age and race; warm; no rash on exposed skin NEURO: Moves all extremities equally PSYCH: The patient's mood and manner are appropriate.  ____________________________________________   LABS (all labs ordered are listed, but only abnormal results are displayed)  Labs Reviewed  BASIC METABOLIC PANEL - Abnormal; Notable for the following components:      Result Value   Glucose, Bld 126 (*)  All other components within normal limits  D-DIMER, QUANTITATIVE - Abnormal; Notable for the following components:   D-Dimer, Quant 0.80 (*)    All other components within normal limits  URINALYSIS, ROUTINE W REFLEX MICROSCOPIC - Abnormal; Notable for the following components:   Color, Urine STRAW (*)    APPearance CLEAR (*)    All other components within normal limits  RESP PANEL BY RT-PCR (FLU A&B, COVID) ARPGX2  CBC  BRAIN NATRIURETIC PEPTIDE   TROPONIN I (HIGH SENSITIVITY)  TROPONIN I (HIGH SENSITIVITY)   ____________________________________________  EKG   EKG Interpretation  Date/Time:  Friday December 04 2020 21:48:25 EST Ventricular Rate:  67 PR Interval:  174 QRS Duration: 76 QT Interval:  390 QTC Calculation: 412 R Axis:   20 Text Interpretation: Normal sinus rhythm Low voltage QRS Cannot rule out Anterior infarct , age undetermined Abnormal ECG No significant change since last tracing Confirmed by Pryor Curia [phone]) on 12/04/2020 11:12:21 PM       ____________________________________________  RADIOLOGY Jessie Foot [place], personally viewed and evaluated these images (plain radiographs) as part of my medical decision making, as well as reviewing the written report by the radiologist.  ED MD interpretation: Chest x-ray clear.  No DVT seen on ultrasound.  Official radiology report(s): DG Chest 2 View  Result Date: 12/05/2020 CLINICAL DATA:  Chest pain, short of breath, weakness EXAM: CHEST - 2 VIEW COMPARISON:  03/05/2020 FINDINGS: The heart size and mediastinal contours are within normal limits. Both lungs are clear. The visualized skeletal structures are unremarkable. IMPRESSION: No active cardiopulmonary disease. Electronically Signed   By: Randa Ngo M.D.   On: 12/05/2020 01:13   US Venous Img Lower Bilateral  Result Date: 12/05/2020 CLINICAL DATA:  History of deep venous thrombosis, lower extremity pain EXAM: BILATERAL LOWER EXTREMITY VENOUS DOPPLER ULTRASOUND TECHNIQUE: Gray-scale sonography with compression, as well as color and duplex ultrasound, were performed to evaluate the deep venous system(s) from the level of the common femoral vein through the popliteal and proximal calf veins. COMPARISON:  None. FINDINGS: VENOUS Normal compressibility of the common femoral, superficial femoral, and popliteal veins, as well as the visualized calf veins. Visualized portions of profunda femoral vein and great saphenous  vein unremarkable. No filling defects to suggest DVT on grayscale or color Doppler imaging. Doppler waveforms show normal direction of venous flow, normal respiratory plasticity and response to augmentation. OTHER None. Limitations: none IMPRESSION: 1. No evidence of deep venous thrombosis within either lower extremity. Electronically Signed   By: Randa Ngo M.D.   On: 12/05/2020 00:36    ____________________________________________   PROCEDURES  Procedure(s) performed (including Critical Care):  Procedures   ____________________________________________   INITIAL IMPRESSION / ASSESSMENT AND PLAN / ED COURSE  As part of my medical decision making, I reviewed the following data within the [place] notes reviewed and incorporated, Labs reviewed , EKG interpreted , Old EKG reviewed, Old chart reviewed, Radiograph reviewed , Discussed with admitting physician  and Notes from prior ED visits         Patient here with complaints of chest pain, shortness of breath, dizziness and generalized weakness.  She has some typical and atypical features of her chest pain with multiple risk factors for ACS, PE/DVT.  No infectious symptoms recently.  She does have some tenderness underneath the left breast.  Discussed with patient that this could be musculoskeletal in nature but will obtain further work-up.  Her initial labs show a normal troponin, nonischemic  EKG.  Will obtain repeat troponin, D-dimer, bilateral lower extremity Dopplers.  She does also have history of grade 2 diastolic dysfunction but has no signs of volume overload at this time.  Will reassess pain after ASA and NTG.  ED PROGRESS  Patient second troponin is negative, flat.  D-dimer slightly elevated.  Unable to rule out PE.  We will proceed with CTA of the chest.  She will need premedication but states she has done well with premedication and CT contrast in the past.  Her BNP today is normal.  Urine shows no  sign of infection or ketones to suggest dehydration.  Chest x-ray clear.  Venous Doppler showed no DVT.  Discussed with patient again that I feel there are some concerning features to her chest pain but also some atypical features.  She reports no significant improvement after 1 nitroglycerin and her blood pressure did drop therefore we could not give her any more.  Given fentanyl with some relief of her chest pain here but she is still having active chest pain.  Have discussed admission for observation versus discharge home with close outpatient follow-up.  She would prefer observation which I feel is reasonable given her risk factors.  Her heart score is 5.    2:37 AM Discussed patient's case with hospitalist, Dr. Sidney Ace.  I have recommended admission and patient (and family if present) agree with this plan. Admitting physician will place admission orders.   I reviewed all nursing notes, vitals, pertinent previous records and reviewed/interpreted all EKGs, lab and urine results, imaging (as available).  6:39 AM  Pt's CTA chest shows no PE or other acute abnormality. ____________________________________________   FINAL CLINICAL IMPRESSION(S) / ED DIAGNOSES  Final diagnoses:  Nonspecific chest pain     ED Discharge Orders    None      *Please note:  ERMAGENE SAIDI was evaluated in Emergency Department on 12/05/2020 for the symptoms described in the history of present illness. She was evaluated in the context of the global COVID-19 pandemic, which necessitated consideration that the patient might be at risk for infection with the SARS-CoV-2 virus that causes COVID-19. Institutional protocols and algorithms that pertain to the evaluation of patients at risk for COVID-19 are in a state of rapid change based on information released by regulatory bodies including the CDC and federal and state organizations. These policies and algorithms were followed during the patient's care in the ED.  Some ED  evaluations and interventions may be delayed as a result of limited staffing during and the pandemic.*   Note:  This document was prepared using Dragon voice recognition software and may include unintentional dictation errors.   Nyrie Sigal, Delice Bison, DO 12/05/20 [place], Delice Bison, DO 12/05/20 [phone]

## 2020-12-04 NOTE — ED Provider Notes (Incomplete)
[facility] Emergency Department Provider Note  ____________________________________________   Event Date/Time   First MD Initiated Contact with Patient 12/04/20 2300     (approximate)  I have reviewed the triage vital signs and the nursing notes.   HISTORY  Chief Complaint Chest Pain    HPI Cassandra Benson is a 80 y.o. female with history of carotid stenosis status post stent on Plavix, hypertension, diabetes, hyperlipidemia, Sjogren's, fibromyalgia, previous DVT no longer on anticoagulation, grade 2 diastolic dysfunction who presents to the emergency department with left-sided chest pain that started yesterday morning.  States she got up from sleep and started her normal routine and had severe left-sided chest pain underneath her breast that she describes as a tightness, pressure but also a sharp pain.  She states pain was worse with exertion but also with movement and deep inspiration.  No injury to her chest that she can recall.  She had associated shortness of breath and lightheadedness with some vertigo.  She denies fever or productive cough.  She reports she has had some pain and swelling in the right calf that has been ongoing for weeks.  She does report several weeks ago having some pain in the left groin that has resolved.  She did have a DVT when she was in her 35s.  States she is no longer on anticoagulation just antiplatelets at this time.  She states she is very concerned this could have been a cardiac event.  She states she felt very weak with this episode and symptoms of generalized weakness have worsened.        Past Medical History:  Diagnosis Date  . Allergy   . ANA positive    positive ANA pattern 1 speckled  . Arthritis   . Carotid stenosis, asymptomatic 06/19/2015   9-38% RICA 18-29% LICA rpt 1 yr (05/3715)   . Colon polyps   . Dermatomyositis ([place])   . Diabetes mellitus without complication (HCC)    Type 1  . Diverticulosis     sigmoid on CT scan 12/2019  . Family history of adverse reaction to anesthesia    brothr went into cardiac arrest from anectine  . Fibromyalgia    prior PCP  . GERD (gastroesophageal reflux disease)    prior PCP  . Glaucoma    Narrow angle  . History of blood clots    DVT, in 20s, none since  . History of chicken pox   . History of diverticulitis   . History of pericarditis 1986   with hospitalization  . History of pneumonia 2014  . History of shingles   . History of UTI   . Hyperlipidemia   . Hypertension   . Hypothyroidism   . Mixed connective tissue disease ([place])   . Partial small bowel obstruction ([place]) 12/2019   managed conservatively  . Peptic ulcer   . Pneumonia   . PONV (postoperative nausea and vomiting)   . Raynaud's disease without gangrene   . Shoulder pain left   h/o RTC tendonitis and adhesive capsulitis  . Sjogren's syndrome ([place])   . Sleep apnea    prior PCP - no CPAP for about 10 yrs  . 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[place])   . Vitamin D deficiency    prior PCP    Patient Active Problem List   Diagnosis Date Noted  . Neck pain 12/03/2020  . Cause of injury, fall 09/01/2020  . Grade II diastolic dysfunction 96/78/9381  . Chronic venous insufficiency 04/07/2020  .  Bowel habit changes 02/18/2020  . Diverticulosis   . Partial small bowel obstruction ([place]) 01/15/2020  . Epistaxis 01/13/2020  . Cough 01/05/2020  . Cognitive changes 10/22/2019  . Right sided abdominal pain 10/07/2019  . Skin lesion 09/14/2019  . Renal insufficiency 09/14/2019  . Pain in right buttock 06/18/2019  . Chronic midline thoracic back pain 05/14/2019  . Sjogren's syndrome without extraglandular involvement ([place]) 02/26/2019  . Epigastric discomfort 12/17/2018  . PAD (peripheral artery disease) ([place]) 10/04/2018  . Raynaud disease 09/04/2018  . Amaurosis fugax, both eyes 06/18/2018  . TIA (transient ischemic attack) 05/21/2018  . 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 Facial  paresthesia 02/07/2018  . Pain and swelling of lower leg 12/25/2017  . Fibromyalgia   . 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 Intractable episodic headache 03/13/2017  . Numbness and tingling of both lower extremities 03/13/2017  . 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 Fatty liver 02/13/2017  . LADA (latent autoimmune diabetes in adults), managed as type 1 ([place]) 10/03/2016  . Medicare annual wellness visit, subsequent 06/19/2015  . Health maintenance examination 06/19/2015  . Advanced care planning/counseling discussion 06/19/2015  . 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 Vitamin D deficiency   . Family history of premature CAD 06/02/2015  . Chest pain 05/26/2015  . 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 Hyperlipidemia associated with type 2 diabetes mellitus ([place]) 07/30/2014  . Essential hypertension 07/17/2014  . Hypothyroidism due to Hashimoto's thyroiditis 07/17/2014  . Adjustment disorder with mixed anxiety and depressed mood 07/17/2014  . Apnea, sleep 08/22/2012  . LBP (low back pain) 02/27/2012  . Positive ANA (antinuclear antibody) 01/06/2012    Past Surgical History:  Procedure Laterality Date  . ABDOMINAL HYSTERECTOMY  1978   fibroids and menorrhagia, ovaries remain  . ARTERY BIOPSY Right 04/06/2018   Procedure: BIOPSY TEMPORAL ARTERY RIGHT;  Surgeon: Beverly Gust, MD;  Location: [place];  Service: ENT;  Laterality: Right;  Diabetic - insulin pump sleep apnea  . [facility] normal per patient  . COLONOSCOPY  10/2011   1 TA, 1 HP, very tortuous colon ([place])  . COLONOSCOPY  02/2020   TA, inflammatory polyp, mod diverticulosis, int/ext hemorrhoids ([place]) no rpt recommended   . COLONOSCOPY WITH ESOPHAGOGASTRODUODENOSCOPY (EGD)  03/2007   2 ulcers, benign polyp, rpt 5 yrs ([place])  . JOINT REPLACEMENT Right    hip  . PARTIAL HIP  ARTHROPLASTY  2013   Right hip replacement  . TONSILLECTOMY    . TONSILLECTOMY AND ADENOIDECTOMY    . TRANSCAROTID ARTERY REVASCULARIZATION Left 07/23/2018   Procedure: TRANSCAROTID ARTERY REVASCULARIZATION;  Surgeon: Marty Heck, MD;  Location: [place];  Service: Vascular;  Laterality: Left;  . TUBAL LIGATION    . VAGINAL DELIVERY     x2, no complications    Prior to Admission medications   Medication Sig Start Date End Date Taking? Authorizing Provider  acetaminophen (TYLENOL) 500 MG tablet Take 1 tablet (500 mg total) by mouth 3 (three) times daily as needed. 01/03/20   [name], MD  Ascorbic Acid (VITAMIN C) 100 MG tablet Take 100 mg by mouth daily.    [provider]  BD ULTRA-FINE LANCETS lancets Use as instructed upto 6 times daily 09/02/14   Phadke, Radhika P, MD  benzonatate (TESSALON) 100 MG capsule Take 1 capsule (100 mg total) by mouth 3 (three) times daily as needed for cough.  05/20/20   Gutierrez, Javier, MD  Cholecalciferol (VITAMIN D3) 25 MCG (1000 UT) CAPS Take 1 capsule (1,000 Units total) by mouth daily. 09/16/20   Gutierrez, Javier, MD  clopidogrel (PLAVIX) 75 MG tablet TAKE 1 TABLET(75 MG) BY MOUTH DAILY 09/10/20   Cain, Brandon Christopher, MD  Continuous Blood Gluc Transmit (DEXCOM G6 TRANSMITTER) MISC 1 kit by Does not apply route See admin instructions. Use to check blood sugar 09/18/19   Gherghe, Cristina, MD  diclofenac Sodium (VOLTAREN) 1 % GEL Apply 2 g topically 3 (three) times daily. 03/15/20   Gutierrez, Javier, MD  glucagon 1 MG injection Inject 1 mg into the muscle once as needed for up to 1 dose. 01/24/20   Gherghe, Cristina, MD  glucose blood test strip Use as instructed to check glucose 4 times daily. 10/08/20   Gherghe, Cristina, MD  insulin aspart (NOVOLOG) 100 UNIT/ML injection USE UP TO 50 UNITS DAILY IN THE INSULIN PUMP 11/05/20   Gherghe, Cristina, MD  insulin degludec (TRESIBA FLEXTOUCH) 100 UNIT/ML FlexTouch Pen Inject 28-30  Units into the skin daily. 12/03/20   Gherghe, Cristina, MD  Insulin Pen Needle (PEN NEEDLES) 32G X 5 MM MISC Use 1x a day 12/03/20   Gherghe, Cristina, MD  losartan (COZAAR) 25 MG tablet Take 1 tablet (25 mg total) by mouth daily. 09/16/20   Gutierrez, Javier, MD  metoprolol succinate (TOPROL-XL) 50 MG 24 hr tablet Take 1 tablet (50 mg total) by mouth at bedtime. Take with or immediately following a meal. 10/29/20   Gutierrez, Javier, MD  nitroGLYCERIN (NITROSTAT) 0.4 MG SL tablet Place 1 tablet (0.4 mg total) under the tongue every 5 (five) minutes as needed for chest pain. 07/31/18   Gutierrez, Javier, MD  pregabalin (LYRICA) 25 MG capsule Take 1 capsule (25 mg total) by mouth 2 (two) times daily. 12/01/20   Gutierrez, Javier, MD  rosuvastatin (CRESTOR) 20 MG tablet Take 1 tablet (20 mg total) by mouth daily. 09/16/20   Gutierrez, Javier, MD  SYNTHROID 75 MCG tablet Take 1 tablet (75 mcg total) by mouth daily before breakfast. Don't take any on Sundays 10/29/20   Gutierrez, Javier, MD  venlafaxine (EFFEXOR) 37.5 MG tablet Take 37.5 mg once a day for a week then increase to 37.5 mg twice a day and continue that dose 09/30/20   [provider]    Allergies Iodinated diagnostic agents, Penicillins, Amlodipine, Anectine [succinylcholine], Codeine, Gabapentin, Influenza vaccines, Nortriptyline, Pamelor [nortriptyline hcl], Valsartan, Zetia [ezetimibe], Erythromycin, and Sulfa antibiotics  Family History  Problem Relation Age of Onset  . CAD Mother 70       MI, aortic valve issues  . COPD Mother   . Lupus Mother   . Graves' disease Mother   . Rheum arthritis Mother   . CAD Father 70       CABG x2, aortic valve replacement  . Stroke Sister   . CAD Sister   . Anuerysm Sister        brain  . Lupus Sister   . Diabetes Sister   . Alcohol abuse Brother   . CAD Brother 41       MI  . Stroke Brother   . Seizures Son   . COPD Brother        agent orange  . CAD Brother 60       stent  .  Diabetes Brother   . Depression Grandchild   . Breast cancer Maternal Aunt   . Diabetes Sister   .   Breast cancer Sister   . Breast cancer Maternal Aunt   . Stroke Maternal Grandmother   . Hypertension Maternal Grandmother   . Gallbladder disease Maternal Grandmother   . Colon cancer Neg Hx   . Esophageal cancer Neg Hx   . Rectal cancer Neg Hx   . Stomach cancer Neg Hx     Social History Social History   Tobacco Use  . Smoking status: Never Smoker  . Smokeless tobacco: Never Used  Vaping Use  . Vaping Use: Never used  Substance Use Topics  . Alcohol use: No  . Drug use: No    Review of Systems Constitutional: No fever. Eyes: No visual changes. ENT: No sore throat. Cardiovascular: Denies chest pain. Respiratory: Denies shortness of breath. Gastrointestinal: No nausea, vomiting, diarrhea. Genitourinary: Negative for dysuria. Musculoskeletal: Negative for back pain. Skin: Negative for rash. Neurological: Negative for focal weakness or numbness.  ____________________________________________   PHYSICAL EXAM:  VITAL SIGNS: ED Triage Vitals  Enc Vitals Group     BP 12/04/20 2143 140/63     Pulse Rate 12/04/20 2143 65     Resp 12/04/20 2143 16     Temp 12/04/20 2211 98.2 F (36.8 C)     Temp Source 12/04/20 2143 Oral     SpO2 12/04/20 2143 96 %     Weight 12/04/20 2144 161 lb (73 kg)     Height 12/04/20 2144 5' 3" (1.6 m)     Head Circumference --      Peak Flow --      Pain Score 12/04/20 2144 6     Pain Loc --      Pain Edu? --      Excl. in GC? --    CONSTITUTIONAL: Alert and oriented and responds appropriately to questions. Well-appearing; well-nourished HEAD: Normocephalic EYES: Conjunctivae clear, pupils appear equal, EOM appear intact ENT: normal nose; moist mucous membranes NECK: Supple, normal ROM CARD: RRR; S1 and S2 appreciated; no murmurs, no clicks, no rubs, no gallops RESP: Normal chest excursion without splinting or tachypnea; breath sounds  clear and equal bilaterally; no wheezes, no rhonchi, no rales, no hypoxia or respiratory distress, speaking full sentences ABD/GI: Normal bowel sounds; non-distended; soft, non-tender, no rebound, no guarding, no peritoneal signs, no hepatosplenomegaly BACK: The back appears normal EXT: Normal ROM in all joints; no deformity noted, no edema; no cyanosis SKIN: Normal color for age and race; warm; no rash on exposed skin NEURO: Moves all extremities equally PSYCH: The patient's mood and manner are appropriate.  ____________________________________________   LABS (all labs ordered are listed, but only abnormal results are displayed)  Labs Reviewed  BASIC METABOLIC PANEL - Abnormal; Notable for the following components:      Result Value   Glucose, Bld 126 (*)    All other components within normal limits  CBC  TROPONIN I (HIGH SENSITIVITY)   ____________________________________________  EKG   EKG Interpretation  Date/Time:  Friday December 04 2020 21:48:25 EST Ventricular Rate:  67 PR Interval:  174 QRS Duration: 76 QT Interval:  390 QTC Calculation: 412 R Axis:   20 Text Interpretation: Normal sinus rhythm Low voltage QRS Cannot rule out Anterior infarct , age undetermined Abnormal ECG No significant change since last tracing Confirmed by Ward, Kristen ([id number]) on 12/04/2020 11:12:21 PM       ____________________________________________  RADIOLOGY I, Kristen Ward, personally viewed and evaluated these images (plain radiographs) as part of my medical decision making, as well as reviewing the written   report by the radiologist.  ED MD interpretation:  ***  Official radiology report(s): No results found.  ____________________________________________   PROCEDURES  Procedure(s) performed (including Critical Care):  Procedures  CRITICAL CARE Performed by: Kristen Ward   Total critical care time: *** minutes  Critical care time was exclusive of separately billable  procedures and treating other patients.  Critical care was necessary to treat or prevent imminent or life-threatening deterioration.  Critical care was time spent personally by me on the following activities: development of treatment plan with patient and/or surrogate as well as nursing, discussions with consultants, evaluation of patient's response to treatment, examination of patient, obtaining history from patient or surrogate, ordering and performing treatments and interventions, ordering and review of laboratory studies, ordering and review of radiographic studies, pulse oximetry and re-evaluation of patient's condition.  ____________________________________________   INITIAL IMPRESSION / ASSESSMENT AND PLAN / ED COURSE  As part of my medical decision making, I reviewed the following data within the electronic medical record:  {[id number]::"Notes from prior ED visits","[place] Controlled Substance Database"}         ***  ED PROGRESS  *** ____________________________________________   FINAL CLINICAL IMPRESSION(S) / ED DIAGNOSES  Final diagnoses:  None     ED Discharge Orders    None      *Please note:  Cassandra Benson was evaluated in Emergency Department on 12/04/2020 for the symptoms described in the history of present illness. She was evaluated in the context of the global COVID-19 pandemic, which necessitated consideration that the patient might be at risk for infection with the SARS-CoV-2 virus that causes COVID-19. Institutional protocols and algorithms that pertain to the evaluation of patients at risk for COVID-19 are in a state of rapid change based on information released by regulatory bodies including the CDC and federal and state organizations. These policies and algorithms were followed during the patient's care in the ED.  Some ED evaluations and interventions may be delayed as a result of limited staffing during and the pandemic.*   Note:  This document was  prepared using Dragon voice recognition software and may include unintentional dictation errors. 

## 2020-12-05 ENCOUNTER — Emergency Department: Payer: HMO

## 2020-12-05 ENCOUNTER — Observation Stay: Payer: HMO

## 2020-12-05 ENCOUNTER — Encounter: Payer: Self-pay | Admitting: Family Medicine

## 2020-12-05 DIAGNOSIS — E785 Hyperlipidemia, unspecified: Secondary | ICD-10-CM

## 2020-12-05 DIAGNOSIS — I739 Peripheral vascular disease, unspecified: Secondary | ICD-10-CM | POA: Diagnosis not present

## 2020-12-05 DIAGNOSIS — E1169 Type 2 diabetes mellitus with other specified complication: Secondary | ICD-10-CM | POA: Diagnosis not present

## 2020-12-05 DIAGNOSIS — E1142 Type 2 diabetes mellitus with diabetic polyneuropathy: Secondary | ICD-10-CM

## 2020-12-05 DIAGNOSIS — J9811 Atelectasis: Secondary | ICD-10-CM | POA: Diagnosis not present

## 2020-12-05 DIAGNOSIS — G43809 Other migraine, not intractable, without status migrainosus: Secondary | ICD-10-CM

## 2020-12-05 DIAGNOSIS — M79661 Pain in right lower leg: Secondary | ICD-10-CM | POA: Diagnosis not present

## 2020-12-05 DIAGNOSIS — R0789 Other chest pain: Principal | ICD-10-CM

## 2020-12-05 DIAGNOSIS — Z86718 Personal history of other venous thrombosis and embolism: Secondary | ICD-10-CM | POA: Diagnosis not present

## 2020-12-05 DIAGNOSIS — R531 Weakness: Secondary | ICD-10-CM | POA: Diagnosis not present

## 2020-12-05 DIAGNOSIS — I259 Chronic ischemic heart disease, unspecified: Secondary | ICD-10-CM

## 2020-12-05 DIAGNOSIS — I10 Essential (primary) hypertension: Secondary | ICD-10-CM

## 2020-12-05 DIAGNOSIS — R079 Chest pain, unspecified: Secondary | ICD-10-CM | POA: Diagnosis not present

## 2020-12-05 DIAGNOSIS — E039 Hypothyroidism, unspecified: Secondary | ICD-10-CM

## 2020-12-05 DIAGNOSIS — R0602 Shortness of breath: Secondary | ICD-10-CM | POA: Diagnosis not present

## 2020-12-05 LAB — URINALYSIS, ROUTINE W REFLEX MICROSCOPIC
Bilirubin Urine: NEGATIVE
Glucose, UA: NEGATIVE mg/dL
Hgb urine dipstick: NEGATIVE
Ketones, ur: NEGATIVE mg/dL
Leukocytes,Ua: NEGATIVE
Nitrite: NEGATIVE
Protein, ur: NEGATIVE mg/dL
Specific Gravity, Urine: 1.005 (ref 1.005–1.030)
pH: 7 (ref 5.0–8.0)

## 2020-12-05 LAB — TROPONIN I (HIGH SENSITIVITY)
Troponin I (High Sensitivity): 3 ng/L
Troponin I (High Sensitivity): 2 ng/L (ref <18)
Troponin I (High Sensitivity): 4 ng/L (ref <18)

## 2020-12-05 LAB — RESP PANEL BY RT-PCR (FLU A&B, COVID) ARPGX2
Influenza A by PCR: NEGATIVE
Influenza B by PCR: NEGATIVE
SARS Coronavirus 2 by RT PCR: NEGATIVE

## 2020-12-05 LAB — LIPID PANEL
Cholesterol: 155 mg/dL (ref 0–200)
HDL: 41 mg/dL (ref >40)
LDL Cholesterol: 94 mg/dL (ref 0–99)
Total CHOL/HDL Ratio: 3.8 RATIO
Triglycerides: 102 mg/dL (ref <150)
VLDL: 20 mg/dL (ref 0–40)

## 2020-12-05 LAB — BRAIN NATRIURETIC PEPTIDE: B Natriuretic Peptide: 51.5 pg/mL (ref 0.0–100.0)

## 2020-12-05 LAB — CBG MONITORING, ED
Glucose-Capillary: 157 mg/dL — ABNORMAL HIGH (ref 70–99)
Glucose-Capillary: 174 mg/dL — ABNORMAL HIGH (ref 70–99)

## 2020-12-05 LAB — D-DIMER, QUANTITATIVE: D-Dimer, Quant: 0.8 ug{FEU}/mL — ABNORMAL HIGH (ref 0.00–0.50)

## 2020-12-05 MED ORDER — ENOXAPARIN SODIUM 40 MG/0.4ML ~~LOC~~ SOLN
40.0000 mg | SUBCUTANEOUS | Status: DC
  Administered 2020-12-05: 40 mg via SUBCUTANEOUS
  Filled 2020-12-05: qty 0.4

## 2020-12-05 MED ORDER — INSULIN GLARGINE 100 UNIT/ML ~~LOC~~ SOLN
28.0000 [IU] | Freq: Every day | SUBCUTANEOUS | Status: DC
  Administered 2020-12-05: 28 [IU] via SUBCUTANEOUS
  Filled 2020-12-05 (×2): qty 0.28

## 2020-12-05 MED ORDER — IOHEXOL 350 MG/ML SOLN
75.0000 mL | Freq: Once | INTRAVENOUS | Status: AC | PRN
  Administered 2020-12-05: 75 mL via INTRAVENOUS

## 2020-12-05 MED ORDER — CLOPIDOGREL BISULFATE 75 MG PO TABS
75.0000 mg | ORAL_TABLET | Freq: Every day | ORAL | Status: DC
Start: 2020-12-05 — End: 2020-12-05
  Administered 2020-12-05: 75 mg via ORAL
  Filled 2020-12-05: qty 1

## 2020-12-05 MED ORDER — INSULIN ASPART 100 UNIT/ML ~~LOC~~ SOLN: 0.0000 [IU] | Freq: Every day | SUBCUTANEOUS | Status: DC

## 2020-12-05 MED ORDER — DIPHENHYDRAMINE HCL 50 MG/ML IJ SOLN
50.0000 mg | Freq: Once | INTRAMUSCULAR | Status: AC
  Filled 2020-12-05: qty 1

## 2020-12-05 MED ORDER — MORPHINE SULFATE (PF) 2 MG/ML IV SOLN: 2.0000 mg | INTRAVENOUS | Status: DC | PRN

## 2020-12-05 MED ORDER — NITROGLYCERIN 0.4 MG SL SUBL: 0.4000 mg | SUBLINGUAL_TABLET | SUBLINGUAL | Status: DC | PRN

## 2020-12-05 MED ORDER — GLUCAGON (RDNA) 1 MG IJ KIT: 1.0000 mg | PACK | Freq: Once | INTRAMUSCULAR | Status: DC | PRN

## 2020-12-05 MED ORDER — HYDROCORTISONE NA SUCCINATE PF 250 MG IJ SOLR
200.0000 mg | Freq: Once | INTRAMUSCULAR | Status: AC
  Administered 2020-12-05: 200 mg via INTRAVENOUS
  Filled 2020-12-05: qty 200

## 2020-12-05 MED ORDER — INSULIN DEGLUDEC 100 UNIT/ML ~~LOC~~ SOPN: 28.0000 [IU] | PEN_INJECTOR | Freq: Every day | SUBCUTANEOUS | Status: DC

## 2020-12-05 MED ORDER — DIPHENHYDRAMINE HCL 25 MG PO CAPS
50.0000 mg | ORAL_CAPSULE | Freq: Once | ORAL | Status: AC
  Administered 2020-12-05: 50 mg via ORAL
  Filled 2020-12-05: qty 2

## 2020-12-05 MED ORDER — ROSUVASTATIN CALCIUM 20 MG PO TABS
20.0000 mg | ORAL_TABLET | Freq: Every day | ORAL | Status: DC
  Administered 2020-12-05: 20 mg via ORAL
  Filled 2020-12-05: qty 1

## 2020-12-05 MED ORDER — INSULIN ASPART 100 UNIT/ML ~~LOC~~ SOLN
0.0000 [IU] | Freq: Three times a day (TID) | SUBCUTANEOUS | Status: DC
  Administered 2020-12-05: 3 [IU] via SUBCUTANEOUS
  Filled 2020-12-05: qty 1

## 2020-12-05 MED ORDER — INSULIN ASPART 100 UNIT/ML ~~LOC~~ SOLN
4.0000 [IU] | Freq: Three times a day (TID) | SUBCUTANEOUS | Status: DC
  Administered 2020-12-05: 4 [IU] via SUBCUTANEOUS
  Filled 2020-12-05: qty 1

## 2020-12-05 MED ORDER — ONDANSETRON HCL 4 MG/2ML IJ SOLN
4.0000 mg | Freq: Once | INTRAMUSCULAR | Status: AC
  Administered 2020-12-05: 4 mg via INTRAVENOUS
  Filled 2020-12-05: qty 2

## 2020-12-05 MED ORDER — ALUM & MAG HYDROXIDE-SIMETH 200-200-20 MG/5ML PO SUSP
30.0000 mL | Freq: Once | ORAL | Status: AC
  Administered 2020-12-05: 30 mL via ORAL
  Filled 2020-12-05: qty 30

## 2020-12-05 MED ORDER — LEVOTHYROXINE SODIUM 50 MCG PO TABS
75.0000 ug | ORAL_TABLET | ORAL | Status: DC
  Administered 2020-12-05: 75 ug via ORAL
  Filled 2020-12-05: qty 2

## 2020-12-05 MED ORDER — ONDANSETRON HCL 4 MG/2ML IJ SOLN: 4.0000 mg | Freq: Four times a day (QID) | INTRAMUSCULAR | Status: DC | PRN

## 2020-12-05 MED ORDER — GLUCAGON HCL RDNA (DIAGNOSTIC) 1 MG IJ SOLR: 1.0000 mg | Freq: Once | INTRAMUSCULAR | Status: DC | PRN

## 2020-12-05 MED ORDER — ASPIRIN EC 81 MG PO TBEC
81.0000 mg | DELAYED_RELEASE_TABLET | Freq: Every day | ORAL | Status: DC
  Administered 2020-12-05: 81 mg via ORAL
  Filled 2020-12-05: qty 1

## 2020-12-05 MED ORDER — SODIUM CHLORIDE 0.9 % IV SOLN: INTRAVENOUS | Status: DC

## 2020-12-05 MED ORDER — BENZONATATE 100 MG PO CAPS: 100.0000 mg | ORAL_CAPSULE | Freq: Three times a day (TID) | ORAL | Status: DC | PRN

## 2020-12-05 MED ORDER — ZOLPIDEM TARTRATE 5 MG PO TABS: 5.0000 mg | ORAL_TABLET | Freq: Every evening | ORAL | Status: DC | PRN

## 2020-12-05 MED ORDER — VITAMIN D3 25 MCG (1000 UNIT) PO TABS
1000.0000 [IU] | ORAL_TABLET | Freq: Every day | ORAL | Status: DC
  Administered 2020-12-05: 1000 [IU] via ORAL
  Filled 2020-12-05 (×2): qty 1

## 2020-12-05 MED ORDER — PREGABALIN 25 MG PO CAPS
25.0000 mg | ORAL_CAPSULE | Freq: Two times a day (BID) | ORAL | Status: DC
  Administered 2020-12-05: 25 mg via ORAL
  Filled 2020-12-05: qty 1

## 2020-12-05 MED ORDER — VENLAFAXINE HCL 37.5 MG PO TABS
37.5000 mg | ORAL_TABLET | Freq: Two times a day (BID) | ORAL | Status: DC
  Administered 2020-12-05: 37.5 mg via ORAL
  Filled 2020-12-05 (×2): qty 1

## 2020-12-05 MED ORDER — ALPRAZOLAM 0.5 MG PO TABS: 0.2500 mg | ORAL_TABLET | Freq: Two times a day (BID) | ORAL | Status: DC | PRN

## 2020-12-05 MED ORDER — FENTANYL CITRATE (PF) 100 MCG/2ML IJ SOLN
50.0000 ug | Freq: Once | INTRAMUSCULAR | Status: AC
  Administered 2020-12-05: 50 ug via INTRAVENOUS
  Filled 2020-12-05: qty 2

## 2020-12-05 MED ORDER — ASCORBIC ACID 500 MG PO TABS
250.0000 mg | ORAL_TABLET | Freq: Every day | ORAL | Status: DC
  Administered 2020-12-05: 250 mg via ORAL
  Filled 2020-12-05: qty 1

## 2020-12-05 MED ORDER — LIDOCAINE VISCOUS HCL 2 % MT SOLN
15.0000 mL | Freq: Once | OROMUCOSAL | Status: AC
  Administered 2020-12-05: 15 mL via ORAL
  Filled 2020-12-05: qty 15

## 2020-12-05 MED ORDER — ACETAMINOPHEN 325 MG PO TABS: 650.0000 mg | ORAL_TABLET | ORAL | Status: DC | PRN

## 2020-12-05 MED ORDER — METOPROLOL SUCCINATE ER 50 MG PO TB24: 50.0000 mg | ORAL_TABLET | Freq: Every day | ORAL | Status: DC

## 2020-12-05 NOTE — H&P (Addendum)
Cassandra Benson   PATIENT NAME: Cassandra Benson    MR#:  [id number]  DATE OF BIRTH:  11/13/1940  DATE OF ADMISSION:  12/04/2020  PRIMARY CARE PHYSICIAN: Cassandra Bush, MD   Patient is coming from: Home.  REQUESTING/REFERRING PHYSICIAN: Ward, Delice Bison, DO  CHIEF COMPLAINT:   Chief Complaint  Patient presents with  . Chest Pain    HISTORY OF PRESENT ILLNESS:  Cassandra Benson is a 80 y.o. Caucasian female with medical history significant for multiple medical problems including type I and type 2 diabetes mellitus, hypertension dyslipidemia and hypothyroidism, who presented to the emergency room with a [place] of left-sided chest pain felt as a sharp pain worse than delivery per her report mainly in the inframammary area with associated nausea, dizziness, dyspnea, palpitations and generalized weakness.  She graded at 10/10 in severity.  She denies any leg pain or edema that is more than her usual with chronic venous insufficiency and mildly larger right than left leg that is chronic.  She has been having occasional wheezing with no cough or hemoptysis.  She was given sublingual.  And IV fentanyl with relief of her pain. ED Course: Upon presentation to the emergency room blood pressure was 139/59 with otherwise normal vital signs.  BMP was within normal.  BNP was 51.5 high 60 troponin I was 3 and later the same.  CBC was within normal.  D-dimer came back 0.8.  UA was unremarkable.    EKG as reviewed by me :  showed normal sinus rhythm with rate of 67 with low voltage QRS  Imaging: Bilateral lower extremity venous Doppler came back negative for DVT.  Two-view chest x-ray showed no acute cardiopulmonary disease.  The patient was given 40 aspirin, and 50 mcg of IV fentanyl and 4 mg of IV Zofran as well as.  Given history of IV dye allergy she was given 50 mg p.o. fentanyl and 20 mg of IV Solu-Cortef.  She will be admitted to an observation progressive unit bed for further evaluation  and management.  PAST MEDICAL HISTORY:   Past Medical History:  Diagnosis Date  . Allergy   . ANA positive    positive ANA pattern 1 speckled  . Arthritis   . Carotid stenosis, asymptomatic 06/19/2015   0-80% RICA 22-33% LICA rpt 1 yr (02/1223)   . Colon polyps   . Dermatomyositis ([place])   . Diabetes mellitus without complication (HCC)    Type 1  . Diverticulosis    sigmoid on CT scan 12/2019  . Family history of adverse reaction to anesthesia    brothr went into cardiac arrest from anectine  . Fibromyalgia    prior PCP  . GERD (gastroesophageal reflux disease)    prior PCP  . Glaucoma    Narrow angle  . History of blood clots    DVT, in 20s, none since  . History of chicken pox   . History of diverticulitis   . History of pericarditis 1986   with hospitalization  . History of pneumonia 2014  . History of shingles   . History of UTI   . Hyperlipidemia   . Hypertension   . Hypothyroidism   . Mixed connective tissue disease ([place])   . Partial small bowel obstruction ([place]) 12/2019   managed conservatively  . Peptic ulcer   . Pneumonia   . PONV (postoperative nausea and vomiting)   . Raynaud's disease without gangrene   . Shoulder pain left  h/o RTC tendonitis and adhesive capsulitis  . Sjogren's syndrome ([place])   . Sleep apnea    prior PCP - no CPAP for about 10 yrs  . 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[place])   . Vitamin D deficiency    prior PCP    PAST SURGICAL HISTORY:   Past Surgical History:  Procedure Laterality Date  . ABDOMINAL HYSTERECTOMY  1978   fibroids and menorrhagia, ovaries remain  . ARTERY BIOPSY Right 04/06/2018   Procedure: BIOPSY TEMPORAL ARTERY RIGHT;  Surgeon: Beverly Gust, MD;  Location: [facility];  Service: ENT;  Laterality: Right;  Diabetic - insulin pump sleep apnea  . [facility] normal per patient  . COLONOSCOPY  10/2011   1 TA, 1 HP, very tortuous colon ([place])  . COLONOSCOPY  02/2020   TA,  inflammatory polyp, mod diverticulosis, int/ext hemorrhoids ([place]) no rpt recommended   . COLONOSCOPY WITH ESOPHAGOGASTRODUODENOSCOPY (EGD)  03/2007   2 ulcers, benign polyp, rpt 5 yrs ([place], [place])  . JOINT REPLACEMENT Right    hip  . PARTIAL HIP ARTHROPLASTY  2013   Right hip replacement  . TONSILLECTOMY    . TONSILLECTOMY AND ADENOIDECTOMY    . TRANSCAROTID ARTERY REVASCULARIZATION Left 07/23/2018   Procedure: TRANSCAROTID ARTERY REVASCULARIZATION;  Surgeon: Marty Heck, MD;  Location: [facility];  Service: Vascular;  Laterality: Left;  . TUBAL LIGATION    . VAGINAL DELIVERY     x2, no complications    SOCIAL HISTORY:   Social History   Tobacco Use  . Smoking status: Never Smoker  . Smokeless tobacco: Never Used  Substance Use Topics  . Alcohol use: No    FAMILY HISTORY:   Family History  Problem Relation Age of Onset  . CAD Mother 35       MI, aortic valve issues  . COPD Mother   . Lupus Mother   . Graves' disease Mother   . Rheum arthritis Mother   . CAD Father 78       CABG x2, aortic valve replacement  . Stroke Sister   . CAD Sister   . Anuerysm Sister        brain  . Lupus Sister   . Diabetes Sister   . Alcohol abuse Brother   . CAD Brother 72       MI  . Stroke Brother   . Seizures Son   . COPD Brother        agent orange  . CAD Brother 92       stent  . Diabetes Brother   . Depression Grandchild   . Breast cancer Maternal Aunt   . Diabetes Sister   . Breast cancer Sister   . Breast cancer Maternal Aunt   . Stroke Maternal Grandmother   . Hypertension Maternal Grandmother   . Gallbladder disease Maternal Grandmother   . Colon cancer Neg Hx   . Esophageal cancer Neg Hx   . Rectal cancer Neg Hx   . Stomach cancer Neg Hx     DRUG ALLERGIES:   Allergies  Allergen Reactions  . Iodinated Diagnostic Agents Other (See Comments)    Itching (severe) and chest tightness  . Penicillins Anaphylaxis, Swelling, Rash and Other  (See Comments)    Has patient had a PCN reaction causing immediate rash, facial/tongue/throat swelling, SOB or lightheadedness with hypotension: Yes Has patient had a PCN reaction causing severe rash involving mucus membranes or skin necrosis: Unknown Has patient  had a PCN reaction that required hospitalization: Unknown Has patient had a PCN reaction occurring within the last 10 years: No If all of the above answers are "NO", then may proceed with Cephalosporin use.   . Amlodipine Swelling    Pedal edema  . Anectine [Succinylcholine] Other (See Comments)    Brother went into cardiac arrest.  . Codeine Nausea Only  . Gabapentin Other (See Comments)    Gait abnormality  . Influenza Vaccines Other (See Comments)    Muscle weakness; unable to walk  . Nortriptyline Other (See Comments)    Eye swelling and mouth drawed up  . Pamelor [Nortriptyline Hcl] Other (See Comments)    Patient states caused her face to draw in together.  . Valsartan Other (See Comments) and Cough    Allergy to generic only, "Hacking" cough  . Zetia [Ezetimibe] Other (See Comments)    Bad muscle cramps  . Erythromycin Rash and Swelling  . Sulfa Antibiotics Rash    REVIEW OF SYSTEMS:   ROS As per history of present illness. All pertinent systems were reviewed above. Constitutional, HEENT, cardiovascular, respiratory, GI, GU, musculoskeletal, neuro, psychiatric, endocrine, integumentary and hematologic systems were reviewed and are otherwise negative/unremarkable except for positive findings mentioned above in the HPI.   MEDICATIONS AT HOME:   Prior to Admission medications   Medication Sig Start Date End Date Taking? Authorizing Provider  acetaminophen (TYLENOL) 500 MG tablet Take 1 tablet (500 mg total) by mouth 3 (three) times daily as needed. 01/03/20  Yes Cassandra Bush, MD  Ascorbic Acid (VITAMIN C) 100 MG tablet Take 100 mg by mouth daily.   Yes [provider]  BD ULTRA-FINE LANCETS lancets Use  as instructed upto 6 times daily 09/02/14  Yes Phadke, Radhika P, MD  benzonatate (TESSALON) 100 MG capsule Take 1 capsule (100 mg total) by mouth 3 (three) times daily as needed for cough. 05/20/20  Yes Cassandra Bush, MD  Cholecalciferol (VITAMIN D3) 25 MCG (1000 UT) CAPS Take 1 capsule (1,000 Units total) by mouth daily. 09/16/20  Yes Cassandra Bush, MD  clopidogrel (PLAVIX) 75 MG tablet TAKE 1 TABLET(75 MG) BY MOUTH DAILY 09/10/20  Yes Waynetta Sandy, MD  Continuous Blood Gluc Transmit (DEXCOM G6 TRANSMITTER) MISC 1 kit by Does not apply route See admin instructions. Use to check blood sugar 09/18/19  Yes Philemon Kingdom, MD  diclofenac Sodium (VOLTAREN) 1 % GEL Apply 2 g topically 3 (three) times daily. 03/15/20  Yes Cassandra Bush, MD  glucagon 1 MG injection Inject 1 mg into the muscle once as needed for up to 1 dose. 01/24/20  Yes Philemon Kingdom, MD  glucose blood test strip Use as instructed to check glucose 4 times daily. 10/08/20  Yes Philemon Kingdom, MD  insulin aspart (NOVOLOG) 100 UNIT/ML injection USE UP TO 50 UNITS DAILY IN THE INSULIN PUMP 11/05/20  Yes Philemon Kingdom, MD  insulin degludec (TRESIBA FLEXTOUCH) 100 UNIT/ML FlexTouch Pen Inject 28-30 Units into the skin daily. 12/03/20  Yes Philemon Kingdom, MD  Insulin Pen Needle (PEN NEEDLES) 32G X 5 MM MISC Use 1x a day 12/03/20  Yes Philemon Kingdom, MD  losartan (COZAAR) 25 MG tablet Take 1 tablet (25 mg total) by mouth daily. 09/16/20  Yes Cassandra Bush, MD  metoprolol succinate (TOPROL-XL) 50 MG 24 hr tablet Take 1 tablet (50 mg total) by mouth at bedtime. Take with or immediately following a meal. 10/29/20  Yes Cassandra Bush, MD  nitroGLYCERIN (NITROSTAT) 0.4 MG SL tablet Place  1 tablet (0.4 mg total) under the tongue every 5 (five) minutes as needed for chest pain. 07/31/18  Yes Cassandra Bush, MD  pregabalin (LYRICA) 25 MG capsule Take 1 capsule (25 mg total) by mouth 2 (two) times daily. 12/01/20   Yes Cassandra Bush, MD  rosuvastatin (CRESTOR) 20 MG tablet Take 1 tablet (20 mg total) by mouth daily. 09/16/20  Yes Cassandra Bush, MD  SYNTHROID 75 MCG tablet Take 1 tablet (75 mcg total) by mouth daily before breakfast. Don't take any on Sundays 10/29/20  Yes Cassandra Bush, MD  venlafaxine [facility]) 37.5 MG tablet Take 37.5 mg once a day for a week then increase to 37.5 mg twice a day and continue that dose 09/30/20  Yes [provider]      VITAL SIGNS:  Blood pressure (!) 115/53, pulse 64, temperature 98.2 F (36.8 C), resp. rate 18, height _0  (1.6 m), weight 73 kg, SpO2 97 %.  PHYSICAL EXAMINATION:  Physical Exam  GENERAL:  80 y.o.-year-old Caucasian female patient lying in the bed with no acute distress.  EYES: Pupils equal, round, reactive to light and accommodation. No scleral icterus. Extraocular muscles intact.  HEENT: Head atraumatic, normocephalic. Oropharynx and nasopharynx clear.  NECK:  Supple, no jugular venous distention. No thyroid enlargement, no tenderness.  LUNGS: Normal breath sounds bilaterally, no wheezing, rales,rhonchi or crepitation. No use of accessory muscles of respiration.  CARDIOVASCULAR: Regular rate and rhythm, S1, S2 normal. No murmurs, rubs, or gallops.  ABDOMEN: Soft, nondistended, nontender. Bowel sounds present. No organomegaly or mass.  EXTREMITIES: No pedal edema, cyanosis, or clubbing.  NEUROLOGIC: Cranial nerves II through XII are intact. Muscle strength 5/5 in all extremities. Sensation intact. Gait not checked.  PSYCHIATRIC: The patient is alert and oriented x 3.  Normal affect and good eye contact. SKIN: No obvious rash, lesion, or ulcer.   LABORATORY PANEL:   CBC Recent Labs  Lab 12/04/20 2155  WBC 10.2  HGB 13.0  HCT 41.1  PLT 214   ------------------------------------------------------------------------------------------------------------------  Chemistries  Recent Labs  Lab 12/04/20 2155  NA 137  K 4.1   CL 106  CO2 24  GLUCOSE 126*  BUN 11  CREATININE 0.86  CALCIUM 9.3   ------------------------------------------------------------------------------------------------------------------  Cardiac Enzymes No results for input(s): TROPONINI in the last 168 hours. ------------------------------------------------------------------------------------------------------------------  RADIOLOGY:  DG Chest 2 View  Result Date: 12/05/2020 CLINICAL DATA:  Chest pain, short of breath, weakness EXAM: CHEST - 2 VIEW COMPARISON:  03/05/2020 FINDINGS: The heart size and mediastinal contours are within normal limits. Both lungs are clear. The visualized skeletal structures are unremarkable. IMPRESSION: No active cardiopulmonary disease. Electronically Signed   By: Randa Ngo M.D.   On: 12/05/2020 01:13   US Venous Img Lower Bilateral  Result Date: 12/05/2020 CLINICAL DATA:  History of deep venous thrombosis, lower extremity pain EXAM: BILATERAL LOWER EXTREMITY VENOUS DOPPLER ULTRASOUND TECHNIQUE: Gray-scale sonography with compression, as well as color and duplex ultrasound, were performed to evaluate the deep venous system(s) from the level of the common femoral vein through the popliteal and proximal calf veins. COMPARISON:  None. FINDINGS: VENOUS Normal compressibility of the common femoral, superficial femoral, and popliteal veins, as well as the visualized calf veins. Visualized portions of profunda femoral vein and great saphenous vein unremarkable. No filling defects to suggest DVT on grayscale or color Doppler imaging. Doppler waveforms show normal direction of venous flow, normal respiratory plasticity and response to augmentation. OTHER None. Limitations: none IMPRESSION: 1. No evidence of deep  venous thrombosis within either lower extremity. Electronically Signed   By: Randa Ngo M.D.   On: 12/05/2020 00:36      IMPRESSION AND PLAN:  Active Problems:   Chest pain  1.  Chest pain, rule out  acute coronary syndrome.    The patient has elevated D-dimer. -The patient will be admitted to an observation telemetry bed.   -Will follow serial cardiac enzymes and EKGs.   -We will obtain a cardiology consult in a.m. for further cardiac risk stratification.   -I notified Dr. Rayann Heman about the patient. -The patient will be placed on aspirin as well as p.r.n. sublingual nitroglycerin and morphine sulfate for pain. -Patient had negative bilateral lower extremity venous Doppler.  She was premedicated for chest CTA that is currently pending.  2.  Type 2 diabetes mellitus with peripheral neuropathy. -The patient will be placed on supplement coverage with NovoLog and will continue basal coverage. -We will continue Lyrica  3.  Dyslipidemia. -We will continue statin therapy.  4.  Hypothyroidism. -We will continue Synthroid and check TSH level.  5.  Essential hypertension. -We will continue Toprol-XL and Cozaar.  DVT prophylaxis: Lovenox. Code Status: full code. Family Communication:  The plan of care was discussed in details with the patient (and family). I answered all questions. The patient agreed to proceed with the above mentioned plan. Further management will depend upon hospital course. Disposition Plan: Back to previous home environment Consults called: Cardiology consult to see [facility] All the records are reviewed and case discussed with ED provider.  Status is: Observation  The patient remains OBS appropriate and will d/c before 2 midnights.  Dispo: The patient is from: Home              Anticipated d/c is to: Home              Patient currently is not medically stable to d/c.   Difficult to place patient No   TOTAL TIME TAKING CARE OF THIS PATIENT: 55 minutes.    Christel Mormon M.D on 12/05/2020 at 3:31 AM  [facility]   From 7 PM-7 AM, contact night-coverage [email or web address]  CC: Primary care physician; Cassandra Bush, MD

## 2020-12-05 NOTE — ED Notes (Signed)
CTA not completed. Clicked off in error.

## 2020-12-05 NOTE — Consult Note (Signed)
Cardiology Consultation:   Patient ID: Cassandra Benson MRN: [id number]; DOB: 10-11-1940  Admit date: 12/04/2020 Date of Consult: 12/05/2020  PCP:  Cassandra Benson, [facility]  Cardiologist:  Cassandra Rogue, MD  Advanced Practice Provider:  No care team member to display Electrophysiologist:  None 6}    Patient Profile:   Cassandra Benson is a 80 y.o. 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 Aileen Benson.  History of Present Illness:   Cassandra Benson is a 80 year old Benson with PMH as Benson. She has a family history of coronary artery disease.  It has been noted she has prior episodes of chest pain and severe chronic headaches.  She experiences flares of lupus associated symptoms.  She is currently being evaluated by neurology for facial numbness and pain.    2000 LHC without significant disease. 03/2019 MPI without ischemia and ruled a low risk scan. 07/2020 echo with EF 55 to 60%, G2 DD.  She reports that yesterday she was walking around the house and felt pain located on the left side / LUQ.  This pain then moved to the central epigastric area, followed by substernal discomfort.  Pain was described as severe and a tightness with pressure and pain.  It was intermittent and became worse with certain movements, deep breathing, and exertion.  She also had some wheezing and shortness of breath.  Symptoms became worse with doing laundry, so she decided to go to urgent care.  She presented to urgent care with recommendation to go to the [facility] ED.  In the ED, BP 140/63 with HR 65 bpm.  Labs showed D-dimer 0.80, BNP 51.5. HS Tn 3, 3, 2, 4.  Renal function and electrolytes WNL.  Lower extremity ultrasound negative for DVT.  CTA without evidence of pulmonary embolism.  She did report improvement in  her chest pain with 1 nitro.  She also felt relief with fentanyl.  Chest x-ray without active disease.    Past Medical History:  Diagnosis Date  . Allergy   . ANA positive    positive ANA pattern 1 speckled  . Arthritis   . Carotid stenosis, asymptomatic 06/19/2015   0-30% RICA 09-23% LICA rpt 1 yr (11/74)   . Colon polyps   . Dermatomyositis ([place])   . Diabetes mellitus without complication (HCC)    Type 1  . Diverticulosis    sigmoid on CT scan 12/2019  . Family history of adverse reaction to anesthesia    brothr went into cardiac arrest from anectine  . Fibromyalgia    prior PCP  . GERD (gastroesophageal reflux disease)    prior PCP  . Glaucoma    Narrow angle  . History of blood clots    DVT, in 20s, none since  . History of chicken pox   . History of diverticulitis   . History of pericarditis 1986   with hospitalization  . History of pneumonia 2014  . History of shingles   . History of UTI   . Hyperlipidemia   . Hypertension   . Hypothyroidism   . Mixed connective tissue disease ([place])   . Partial small bowel obstruction ([place]) 12/2019   managed conservatively  . Peptic ulcer   . Pneumonia   . PONV (postoperative nausea and vomiting)   . Raynaud's  disease without gangrene   . Shoulder pain left   h/o RTC tendonitis and adhesive capsulitis  . Sjogren's syndrome ([place])   . Sleep apnea    prior PCP - no CPAP for about 10 yrs  . 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[place])   . Vitamin D deficiency    prior PCP    Past Surgical History:  Procedure Laterality Date  . ABDOMINAL HYSTERECTOMY  1978   fibroids and menorrhagia, ovaries remain  . ARTERY BIOPSY Right 04/06/2018   Procedure: BIOPSY TEMPORAL ARTERY RIGHT;  Surgeon: Cassandra Gust, MD;  Location: [place];  Service: ENT;  Laterality: Right;  Diabetic - insulin pump sleep apnea  . [facility] normal per patient  . COLONOSCOPY  10/2011   1 TA, 1 HP, very tortuous colon (Cassandra Benson)   . COLONOSCOPY  02/2020   TA, inflammatory polyp, mod diverticulosis, int/ext hemorrhoids (Cassandra Benson) no rpt recommended   . COLONOSCOPY WITH ESOPHAGOGASTRODUODENOSCOPY (EGD)  03/2007   2 ulcers, benign polyp, rpt 5 yrs (Cassandra Benson, Cassandra Benson)  . JOINT REPLACEMENT Right    hip  . PARTIAL HIP ARTHROPLASTY  2013   Right hip replacement  . TONSILLECTOMY    . TONSILLECTOMY AND ADENOIDECTOMY    . TRANSCAROTID ARTERY REVASCULARIZATION Left 07/23/2018   Procedure: TRANSCAROTID ARTERY REVASCULARIZATION;  Surgeon: Cassandra Heck, MD;  Location: [place];  Service: Vascular;  Laterality: Left;  . TUBAL LIGATION    . VAGINAL DELIVERY     x2, no complications     Home Medications:  Prior to Admission medications   Medication Sig Start Date End Date Taking? Authorizing Provider  acetaminophen (TYLENOL) 500 MG tablet Take 1 tablet (500 mg total) by mouth 3 (three) times daily as needed. 01/03/20  Yes Cassandra Bush, MD  Ascorbic Acid (VITAMIN C) 100 MG tablet Take 100 mg by mouth daily.   Yes [provider]  BD ULTRA-FINE LANCETS lancets Use as instructed upto 6 times daily 09/02/14  Yes Phadke, Radhika P, MD  benzonatate (TESSALON) 100 MG capsule Take 1 capsule (100 mg total) by mouth 3 (three) times daily as needed for cough. 05/20/20  Yes Cassandra Bush, MD  Cholecalciferol (VITAMIN D3) 25 MCG (1000 UT) CAPS Take 1 capsule (1,000 Units total) by mouth daily. 09/16/20  Yes Cassandra Bush, MD  clopidogrel (PLAVIX) 75 MG tablet TAKE 1 TABLET(75 MG) BY MOUTH DAILY 09/10/20  Yes Cassandra Sandy, MD  Continuous Blood Gluc Transmit (DEXCOM G6 TRANSMITTER) MISC 1 kit by Does not apply route See admin instructions. Use to check blood sugar 09/18/19  Yes Cassandra Kingdom, MD  diclofenac Sodium (VOLTAREN) 1 % GEL Apply 2 g topically 3 (three) times daily. 03/15/20  Yes Cassandra Bush, MD  glucagon 1 MG injection Inject 1 mg into the muscle once as needed for up to 1 dose.  01/24/20  Yes Cassandra Kingdom, MD  glucose blood test strip Use as instructed to check glucose 4 times daily. 10/08/20  Yes Cassandra Kingdom, MD  insulin aspart (NOVOLOG) 100 UNIT/ML injection USE UP TO 50 UNITS DAILY IN THE INSULIN PUMP 11/05/20  Yes Cassandra Kingdom, MD  insulin degludec (TRESIBA FLEXTOUCH) 100 UNIT/ML FlexTouch Pen Inject 28-30 Units into the skin daily. 12/03/20  Yes Cassandra Kingdom, MD  Insulin Pen Needle (PEN NEEDLES) 32G X 5 MM MISC Use 1x a day 12/03/20  Yes Cassandra Kingdom, MD  losartan (COZAAR) 25 MG tablet Take 1 tablet (25 mg total) by mouth daily. 09/16/20  Yes Cassandra Bush, MD  metoprolol succinate (TOPROL-XL) 50 MG 24 hr tablet Take 1 tablet (50 mg total) by mouth at bedtime. Take with or immediately following a meal. 10/29/20  Yes Cassandra Bush, MD  nitroGLYCERIN (NITROSTAT) 0.4 MG SL tablet Place 1 tablet (0.4 mg total) under the tongue every 5 (five) minutes as needed for chest pain. 07/31/18  Yes Cassandra Bush, MD  pregabalin (LYRICA) 25 MG capsule Take 1 capsule (25 mg total) by mouth 2 (two) times daily. 12/01/20  Yes Cassandra Bush, MD  rosuvastatin (CRESTOR) 20 MG tablet Take 1 tablet (20 mg total) by mouth daily. 09/16/20  Yes Cassandra Bush, MD  SYNTHROID 75 MCG tablet Take 1 tablet (75 mcg total) by mouth daily before breakfast. Don't take any on Sundays 10/29/20  Yes Cassandra Bush, MD  venlafaxine [facility]) 37.5 MG tablet Take 37.5 mg once a day for a week then increase to 37.5 mg twice a day and continue that dose 09/30/20  Yes [provider]    Inpatient Medications: Scheduled Meds: . ascorbic acid  250 mg Oral Daily  . aspirin EC  81 mg Oral Daily  . cholecalciferol  1,000 Units Oral Daily  . clopidogrel  75 mg Oral Daily  . enoxaparin (LOVENOX) injection  40 mg Subcutaneous Q24H  . insulin aspart  0-15 Units Subcutaneous TID WC  . insulin aspart  0-5 Units Subcutaneous QHS  . insulin aspart  4 Units Subcutaneous TID WC   . insulin glargine  28 Units Subcutaneous Daily  . levothyroxine  75 mcg Oral Once per day on Mon Tue Wed Thu Fri Sat  . metoprolol succinate  50 mg Oral QHS  . pregabalin  25 mg Oral BID  . rosuvastatin  20 mg Oral Daily  . venlafaxine  37.5 mg Oral BID WC   Continuous Infusions: . sodium chloride 100 mL/hr at 12/05/20 0521   PRN Meds: acetaminophen, ALPRAZolam, benzonatate, glucagon (human recombinant), morphine injection, nitroGLYCERIN, nitroGLYCERIN, ondansetron (ZOFRAN) IV, zolpidem  Allergies:    Allergies  Allergen Reactions  . Iodinated Diagnostic Agents Other (See Comments)    Itching (severe) and chest tightness  . Penicillins Anaphylaxis, Swelling, Rash and Other (See Comments)    Has patient had a PCN reaction causing immediate rash, facial/tongue/throat swelling, SOB or lightheadedness with hypotension: Yes Has patient had a PCN reaction causing severe rash involving mucus membranes or skin necrosis: Unknown Has patient had a PCN reaction that required hospitalization: Unknown Has patient had a PCN reaction occurring within the last 10 years: No If all of the Benson answers are "NO", then may proceed with Cephalosporin use.   . Amlodipine Swelling    Pedal edema  . Anectine [Succinylcholine] Other (See Comments)    Brother went into cardiac arrest.  . Codeine Nausea Only  . Gabapentin Other (See Comments)    Gait abnormality  . Influenza Vaccines Other (See Comments)    Muscle weakness; unable to walk  . Nortriptyline Other (See Comments)    Eye swelling and mouth drawed up  . Pamelor [Nortriptyline Hcl] Other (See Comments)    Patient states caused her face to draw in together.  . Valsartan Other (See Comments) and Cough    Allergy to generic only, "Hacking" cough  . Zetia [Ezetimibe] Other (See Comments)    Bad muscle cramps  . Erythromycin Rash and Swelling  . Sulfa Antibiotics Rash    Social History:   Social History   Socioeconomic History  .  Marital status:  Widowed    Spouse name: Not on file  . Number of children: 2  . Years of education: Not on file  . Highest education level: Not on file  Occupational History  . Occupation: retired  Tobacco Use  . Smoking status: Never Smoker  . Smokeless tobacco: Never Used  Vaping Use  . Vaping Use: Never used  Substance and Sexual Activity  . Alcohol use: No  . Drug use: No  . Sexual activity: Not on file  Other Topics Concern  . Not on file  Social History Narrative   Lives in [place] now. Recently moved from [place]. Widow - husband decreased 01/2016 of metastatic colon CA   No pets.   Mother of Carlina Derks.   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 Difficulty of Paying Living Expenses: Not very hard  Food Insecurity: No Food Insecurity  . Worried About Charity fundraiser in the Last Year: Never true  . Ran Out of Food in the Last Year: Never true  Transportation Needs: No Transportation Needs  . Lack of Transportation (Medical): No  . Lack of Transportation (Non-Medical): No  Physical Activity: Insufficiently Active  . Days of Exercise per Week: 7 days  . Minutes of Exercise per Session: 20 min  Stress: No Stress Concern Present  . Feeling of Stress : Not at all  Social Connections: Not on file  Intimate Partner Violence: Not At Risk  . Fear of Current or Ex-Partner: No  . Emotionally Abused: No  . Physically Abused: No  . Sexually Abused: No    Family History:    Family History  Problem Relation Age of Onset  . CAD Mother 65       MI, aortic valve issues  . COPD Mother   . Lupus Mother   . Graves' disease Mother   . Rheum arthritis Mother   . CAD Father 25       CABG x2,  aortic valve replacement  . Stroke Sister   . CAD Sister   . Anuerysm Sister        brain  . Lupus Sister   . Diabetes Sister   . Alcohol abuse Brother   . CAD Brother 45       MI  . Stroke Brother   . Seizures Son   . COPD Brother        agent orange  . CAD Brother 76       stent  . Diabetes Brother   . Depression Grandchild   . Breast cancer Maternal Aunt   . Diabetes Sister   . Breast cancer Sister   . Breast cancer Maternal Aunt   . Stroke Maternal Grandmother   . Hypertension Maternal Grandmother   . Gallbladder disease Maternal Grandmother   . Colon cancer Neg Hx   . Esophageal cancer Neg Hx   . Rectal cancer Neg Hx   . Stomach cancer Neg Hx      ROS:  Please see the history of present illness.  Review of Systems  Respiratory: Positive for shortness of breath.   Cardiovascular: Positive for chest pain.  Gastrointestinal: Positive for abdominal pain.  All other systems  reviewed and are negative.   All other ROS reviewed and negative.     Physical Exam/Data:   Vitals:   12/05/20 0530 12/05/20 0545 12/05/20 0953 12/05/20 1100  BP: 132/66 132/66 137/72 (!) 136/58  Pulse: (!) 55 63 73 71  Resp: _0 Temp:   98 F (36.7 C)   TempSrc:   Oral   SpO2: 94% 97% 96% 96%  Weight:      Height:        Intake/Output Summary (Last 24 hours) at 12/05/2020 1356 Last data filed at 12/05/2020 1034 Gross per 24 hour  Intake --  Output 600 ml  Net -600 ml   Last 3 Weights 12/04/2020 12/03/2020 12/01/2020  Weight (lbs) 161 lb 161 lb 6.4 oz 162 lb 6 oz  Weight (kg) 73.029 kg 73.211 kg 73.653 kg     Body mass index is 28.52 kg/m.  General:  Well nourished, well developed, in no acute distress HEENT: normal Lymph: no adenopathy Neck: no JVD Endocrine:  No thryomegaly Vascular: No carotid bruits; FA pulses 2+ bilaterally without bruits  Cardiac:  normal S1, S2; tachycardic but regular; no murmur  Lungs:  clear to auscultation bilaterally, no wheezing, rhonchi  or rales  Abd: soft, nontender, no hepatomegaly  Ext: no edema Musculoskeletal:  No deformities, BUE and BLE strength normal and equal Skin: warm and dry  Neuro:  CNs 2-12 intact, no focal abnormalities noted Psych:  Normal affect   EKG:  The EKG was personally reviewed and demonstrates:  NSR, low voltage, poor R wave progression in V1 and V3, poor conduction in 3 and aVF, no acute ST/T changes Telemetry:  Telemetry was personally reviewed and demonstrates:  NSR-ST  Relevant CV Studies: Echo 07/2020 1. Left ventricular ejection fraction, by estimation, is 55 to 60%. The  left ventricle has normal function. The left ventricle has no regional  wall motion abnormalities. Left ventricular diastolic parameters are  consistent with Grade II diastolic  dysfunction (pseudonormalization). The average left ventricular global  longitudinal strain is -14.8 %. The global longitudinal strain is  abnormal.  2. Right ventricular systolic function is normal. The right ventricular  size is normal.  3. The mitral valve is normal in structure. No evidence of mitral valve  regurgitation. No evidence of mitral stenosis.  4. The aortic valve is normal in structure. Aortic valve regurgitation is  not visualized. No aortic stenosis is present.   03/2019 Pharmacological myocardial perfusion imaging study with no significant  ischemia Normal wall motion, EF estimated at 93% No EKG changes concerning for ischemia at peak stress or in recovery. Low risk scan Laboratory Data:  High Sensitivity Troponin:   Recent Labs  Lab 12/04/20 2155 12/05/20 0011 12/05/20 0332 12/05/20 0508  TROPONINIHS _1 Chemistry Recent Labs  Lab 12/04/20 2155  NA 137  K 4.1  CL 106  CO2 24  GLUCOSE 126*  BUN 11  CREATININE 0.86  CALCIUM 9.3  GFRNONAA >60  ANIONGAP 7    No results for input(s): PROT, ALBUMIN, AST, ALT, ALKPHOS, BILITOT in the last 168 hours. Hematology Recent Labs  Lab 12/04/20 2155   WBC 10.2  RBC 4.85  HGB 13.0  HCT 41.1  MCV 84.7  MCH 26.8  MCHC 31.6  RDW 13.6  PLT 214   BNP Recent Labs  Lab 12/05/20 0011  BNP 51.5    DDimer  Recent Labs  Lab 12/05/20 0011  DDIMER 0.80*  Radiology/Studies:  DG Chest 2 View  Result Date: 12/05/2020 CLINICAL DATA:  Chest pain, short of breath, weakness EXAM: CHEST - 2 VIEW COMPARISON:  03/05/2020 FINDINGS: The heart size and mediastinal contours are within normal limits. Both lungs are clear. The visualized skeletal structures are unremarkable. IMPRESSION: No active cardiopulmonary disease. Electronically Signed   By: Randa Ngo M.D.   On: 12/05/2020 01:13   CT Angio Chest PE W and/or Wo Contrast  Result Date: 12/05/2020 CLINICAL DATA:  80 year old Benson with sharp left chest pain. Weakness and shortness of breath. EXAM: CT ANGIOGRAPHY CHEST WITH CONTRAST TECHNIQUE: Multidetector CT imaging of the chest was performed using the standard protocol during bolus administration of intravenous contrast. Multiplanar CT image reconstructions and MIPs were obtained to evaluate the vascular anatomy. CONTRAST:  18mL OMNIPAQUE IOHEXOL 350 MG/ML SOLN COMPARISON:  Chest radiographs 12/05/2020.  CTA chest 10/02/2017. FINDINGS: Cardiovascular: Good contrast bolus timing in the pulmonary arterial tree. No focal filling defect identified in the pulmonary arteries to suggest acute pulmonary embolism. No definite calcified coronary artery atherosclerosis. Calcified aortic atherosclerosis. Stable visible aorta since 2019, no dissection or aneurysm. Stable cardiac size within normal limits. No pericardial effusion. Mediastinum/Nodes: Negative.  No lymphadenopathy. Lungs/Pleura: Major airways are patent. Similar somewhat low lung volumes to the 2019 CTA. Mild dependent atelectasis. Otherwise negative lungs. No pleural effusion. Upper Abdomen: Negative visible liver, gallbladder, spleen, pancreas, adrenal glands and bowel in the upper abdomen.  Musculoskeletal: Negative for age. Review of the MIP images confirms the Benson findings. IMPRESSION: 1. Negative for acute pulmonary embolus. 2. Mild pulmonary atelectasis. 3.  Aortic Atherosclerosis (ICD10-I70.0). Electronically Signed   By: Genevie Ann M.D.   On: 12/05/2020 06:13   US Venous Img Lower Bilateral  Result Date: 12/05/2020 CLINICAL DATA:  History of deep venous thrombosis, lower extremity pain EXAM: BILATERAL LOWER EXTREMITY VENOUS DOPPLER ULTRASOUND TECHNIQUE: Gray-scale sonography with compression, as well as color and duplex ultrasound, were performed to evaluate the deep venous system(s) from the level of the common femoral vein through the popliteal and proximal calf veins. COMPARISON:  None. FINDINGS: VENOUS Normal compressibility of the common femoral, superficial femoral, and popliteal veins, as well as the visualized calf veins. Visualized portions of profunda femoral vein and great saphenous vein unremarkable. No filling defects to suggest DVT on grayscale or color Doppler imaging. Doppler waveforms show normal direction of venous flow, normal respiratory plasticity and response to augmentation. OTHER None. Limitations: none IMPRESSION: 1. No evidence of deep venous thrombosis within either lower extremity. Electronically Signed   By: Randa Ngo M.D.   On: 12/05/2020 00:36     Assessment and Plan:   Chest pain, atypical --CP started while walking and located on the left side / LUQ then later moved to epigastric area, followed by substernal.  Associated symptoms included shortness of breath.  Chest pain is atypical in nature.  Previous echo Benson with normal EF.  Previous MPI low risk.  High-sensitivity troponin negative.  EKG without acute ST/T changes.  Rules out for ACS.  Low suspicion for chest pain due to coronary insufficiency given her work-up and HPI Benson.  Euvolemic and well compensated on exam.  BNP not significantly elevated.  CTA without evidence of PE.  Ultrasound  without evidence of DVT.  Chest x-ray without significant findings.  Consider MSK etiology of chest pain versus GI/esophageal spasms given the improvement with nitro.  No further ischemic work-up recommended at this time.  Will ensure she goes home with sublingual nitro.  Ensure she has current sublingual nitro at discharge.    With continued CP, could consider Imdur as an outpatient.    Discussed possible future outpatient work-up with MPI.    Given her contrast allergy, MPI may be the preferred study over cardiac CT, which was also discussed with the patient; however, would require premedication for contrast allergy.  HTN --Continue current medications.  HLD --Continue current statin.      HEAR Score (for undifferentiated chest pain):  HEAR Score: 5     For questions or updates, please contact [place] Please consult [email or web address] for contact info under    Signed, Arvil Chaco, PA-C  12/05/2020 1:56 PM

## 2020-12-05 NOTE — ED Notes (Signed)
US at bedside

## 2020-12-05 NOTE — Progress Notes (Signed)
[facility] PROGRESS NOTE    Progress Note  Cassandra Benson  HYQ:[id number] DOB: 07/06/1941 DOA: 12/04/2020 PCP: [name], MD     Brief Narrative:   Cassandra Benson is an 80 y.o. female past medical history significant diabetes mellitus type 2, comes into the emergency room for left-sided chest pain, she also relates wheezing with no cough.  In the ED was found to have a BNP of 51 troponin was negative, chest x-ray showed no cardiopulmonary disease.  Antibiotics: None  Microbiology data: Blood culture:  Procedures: None  Assessment/Plan:   Atypical Chest pain: She describes a pleuritic component to her chest pain. Cardiac biomarkers basically remained flat, twelve-lead EKG showed normal sinus rhythm normal axis normal T wave abnormalities. Lower extremity Doppler was negative for DVT. CT angio of the chest was negative for PE and no acute abnormalities. Influenza PCR and SARS-CoV-2 were negative. She is low risk for cardiopulmonary event. 2D echocardiogram done on November of last year showed an EF of 55%, with a grade 2 diastolic dysfunction, no aortic stenosis. Pain is reproducible by palpation question costochondritis versus musculoskeletal. Heart score is 3 she is low risk. Awaiting cardiology's recommendations.  Diabetes mellitus type 2 with hyperlipidemia: Start on long-acting insulin plus sliding scale. With an A1c of 9.7.  Continue statins.  Hypothyroidism: Continue Synthroid.  Essential hypertension: Continue Toprol and Cozaar, blood pressure is well controlled.    DVT prophylaxis: lovenxo Family Communication:none Status is: Observation  The patient remains OBS appropriate and will d/c before 2 midnights.  Dispo: The patient is from: Home              Anticipated d/c is to: Home              Patient currently is not medically stable to d/c.   Difficult to place patient No        Code Status:     Code Status Orders  (From  admission, onward)         Start     Ordered   12/05/20 0308  Full code  Continuous        12/05/20 0309        Code Status History    Date Active Date Inactive Code Status Order ID Comments User Context   01/15/2020 0409 01/16/2020 1907 Full Code [id number]  Athena Masse, MD ED   07/23/2018 1439 07/24/2018 1749 Full Code [id number]  Gabriel Earing, PA-C Inpatient   10/01/2017 2233 10/02/2017 1928 Full Code [id number]  Fritzi Mandes, MD Inpatient   05/26/2015 0830 05/27/2015 1455 Full Code [id number]  Harrie Foreman, MD Inpatient   Advance Care Planning Activity        IV Access:    Peripheral IV   Procedures and diagnostic studies:   DG Chest 2 View  Result Date: 12/05/2020 CLINICAL DATA:  Chest pain, short of breath, weakness EXAM: CHEST - 2 VIEW COMPARISON:  03/05/2020 FINDINGS: The heart size and mediastinal contours are within normal limits. Both lungs are clear. The visualized skeletal structures are unremarkable. IMPRESSION: No active cardiopulmonary disease. Electronically Signed   By: Randa Ngo M.D.   On: 12/05/2020 01:13   CT Angio Chest PE W and/or Wo Contrast  Result Date: 12/05/2020 CLINICAL DATA:  80 year old female with sharp left chest pain. Weakness and shortness of breath. EXAM: CT ANGIOGRAPHY CHEST WITH CONTRAST TECHNIQUE: Multidetector CT imaging of the chest was performed using the standard protocol during bolus administration of intravenous contrast.  Multiplanar CT image reconstructions and MIPs were obtained to evaluate the vascular anatomy. CONTRAST:  62mL OMNIPAQUE IOHEXOL 350 MG/ML SOLN COMPARISON:  Chest radiographs 12/05/2020.  CTA chest 10/02/2017. FINDINGS: Cardiovascular: Good contrast bolus timing in the pulmonary arterial tree. No focal filling defect identified in the pulmonary arteries to suggest acute pulmonary embolism. No definite calcified coronary artery atherosclerosis. Calcified aortic atherosclerosis. Stable visible aorta since 2019, no  dissection or aneurysm. Stable cardiac size within normal limits. No pericardial effusion. Mediastinum/Nodes: Negative.  No lymphadenopathy. Lungs/Pleura: Major airways are patent. Similar somewhat low lung volumes to the 2019 CTA. Mild dependent atelectasis. Otherwise negative lungs. No pleural effusion. Upper Abdomen: Negative visible liver, gallbladder, spleen, pancreas, adrenal glands and bowel in the upper abdomen. Musculoskeletal: Negative for age. Review of the MIP images confirms the above findings. IMPRESSION: 1. Negative for acute pulmonary embolus. 2. Mild pulmonary atelectasis. 3.  Aortic Atherosclerosis (ICD10-I70.0). Electronically Signed   By: Genevie Ann M.D.   On: 12/05/2020 06:13   US Venous Img Lower Bilateral  Result Date: 12/05/2020 CLINICAL DATA:  History of deep venous thrombosis, lower extremity pain EXAM: BILATERAL LOWER EXTREMITY VENOUS DOPPLER ULTRASOUND TECHNIQUE: Gray-scale sonography with compression, as well as color and duplex ultrasound, were performed to evaluate the deep venous system(s) from the level of the common femoral vein through the popliteal and proximal calf veins. COMPARISON:  None. FINDINGS: VENOUS Normal compressibility of the common femoral, superficial femoral, and popliteal veins, as well as the visualized calf veins. Visualized portions of profunda femoral vein and great saphenous vein unremarkable. No filling defects to suggest DVT on grayscale or color Doppler imaging. Doppler waveforms show normal direction of venous flow, normal respiratory plasticity and response to augmentation. OTHER None. Limitations: none IMPRESSION: 1. No evidence of deep venous thrombosis within either lower extremity. Electronically Signed   By: Randa Ngo M.D.   On: 12/05/2020 00:36     Medical Consultants:    None.   Subjective:    Cassandra Benson she relate her pain has subsided.  Objective:    Vitals:   12/05/20 0127 12/05/20 0138 12/05/20 0141 12/05/20 0545   BP: (!) 137/59 (!) 115/53  132/66  Pulse: 62 71 64 63  Resp: 13  18 20   Temp:      TempSrc:      SpO2: 99% 97% 97% 97%  Weight:      Height:       SpO2: 97 %  No intake or output data in the 24 hours ending 12/05/20 0702 Filed Weights   12/04/20 2144  Weight: 73 kg    Exam: General exam: In no acute distress. Respiratory system: Good air movement and clear to auscultation. Cardiovascular system: S1 & S2 heard, RRR. No JVD,. Gastrointestinal system: Abdomen is nondistended, soft and nontender.  Extremities: No pedal edema. Skin: No rashes, lesions or ulcers Psychiatry: Judgement and insight appear normal. Mood & affect appropriate.    Data Reviewed:    Labs: Basic Metabolic Panel: Recent Labs  Lab 12/04/20 2155  NA 137  K 4.1  CL 106  CO2 24  GLUCOSE 126*  BUN 11  CREATININE 0.86  CALCIUM 9.3   GFR Estimated Creatinine Clearance: 51.6 mL/min (by C-G formula based on SCr of 0.86 mg/dL). Liver Function Tests: No results for input(s): AST, ALT, ALKPHOS, BILITOT, PROT, ALBUMIN in the last 168 hours. No results for input(s): LIPASE, AMYLASE in the last 168 hours. No results for input(s): AMMONIA in the last 168 hours.  Coagulation profile No results for input(s): INR, PROTIME in the last 168 hours. COVID-19 Labs  Recent Labs    12/05/20 0011  DDIMER 0.80*    Lab Results  Component Value Date   SARSCOV2NAA NEGATIVE 12/05/2020   SARSCOV2NAA RESULT: NEGATIVE 03/23/2020   [place] NEGATIVE 01/15/2020    CBC: Recent Labs  Lab 12/04/20 2155  WBC 10.2  HGB 13.0  HCT 41.1  MCV 84.7  PLT 214   Cardiac Enzymes: No results for input(s): CKTOTAL, CKMB, CKMBINDEX, TROPONINI in the last 168 hours. BNP (last 3 results) No results for input(s): PROBNP in the last 8760 hours. CBG: Recent Labs  Lab 12/05/20 0645  GLUCAP 157*   D-Dimer: Recent Labs    12/05/20 0011  DDIMER 0.80*   Hgb A1c: Recent Labs    12/03/20 1351  HGBA1C 9.7*   Lipid  Profile: Recent Labs    12/05/20 0332  CHOL 155  HDL 41  LDLCALC 94  TRIG 102  CHOLHDL 3.8   Thyroid function studies: No results for input(s): TSH, T4TOTAL, T3FREE, THYROIDAB in the last 72 hours.  Invalid input(s): FREET3 Anemia work up: No results for input(s): VITAMINB12, FOLATE, FERRITIN, TIBC, IRON, RETICCTPCT in the last 72 hours. Sepsis Labs: Recent Labs  Lab 12/04/20 2155  WBC 10.2   Microbiology Recent Results (from the past 240 hour(s))  Resp Panel by RT-PCR (Flu A&B, Covid) Nasopharyngeal Swab     Status: None   Collection Time: 12/05/20  3:32 AM   Specimen: Nasopharyngeal Swab; Nasopharyngeal(NP) swabs in vial transport medium  Result Value Ref Range Status   SARS Coronavirus 2 by RT PCR NEGATIVE NEGATIVE Final    Comment: (NOTE) SARS-CoV-2 target nucleic acids are NOT DETECTED.  The SARS-CoV-2 RNA is generally detectable in upper respiratory specimens during the acute phase of infection. The lowest concentration of SARS-CoV-2 viral copies this assay can detect is 138 copies/mL. A negative result does not preclude SARS-Cov-2 infection and should not be used as the sole basis for treatment or other patient management decisions. A negative result may occur with  improper specimen collection/handling, submission of specimen other than nasopharyngeal swab, presence of viral mutation(s) within the areas targeted by this assay, and inadequate number of viral copies(<138 copies/mL). A negative result must be combined with clinical observations, patient history, and epidemiological information. The expected result is Negative.  Fact Sheet for Patients:  EntrepreneurPulse.com.au  Fact Sheet for Healthcare Providers:  IncredibleEmployment.be  This test is no t yet approved or cleared by the [place] FDA and  has been authorized for detection and/or diagnosis of SARS-CoV-2 by FDA under an Emergency Use Authorization (EUA).  This EUA will remain  in effect (meaning this test can be used) for the duration of the COVID-19 declaration under Section 564(b)(1) of the Act, 21 U.S.C.section 360bbb-3(b)(1), unless the authorization is terminated  or revoked sooner.       Influenza A by PCR NEGATIVE NEGATIVE Final   Influenza B by PCR NEGATIVE NEGATIVE Final    Comment: (NOTE) The Xpert Xpress SARS-CoV-2/FLU/RSV plus assay is intended as an aid in the diagnosis of influenza from Nasopharyngeal swab specimens and should not be used as a sole basis for treatment. Nasal washings and aspirates are unacceptable for Xpert Xpress SARS-CoV-2/FLU/RSV testing.  Fact Sheet for Patients: EntrepreneurPulse.com.au  Fact Sheet for Healthcare Providers: IncredibleEmployment.be  This test is not yet approved or cleared by the [place] FDA and has been authorized for detection and/or diagnosis of SARS-CoV-2 by  FDA under an Emergency Use Authorization (EUA). This EUA will remain in effect (meaning this test can be used) for the duration of the COVID-19 declaration under Section 564(b)(1) of the Act, 21 U.S.C. section 360bbb-3(b)(1), unless the authorization is terminated or revoked.  Performed at [facility], [address] [place], [place] [address]      Medications:   . ascorbic acid  250 mg Oral Daily  . aspirin EC  81 mg Oral Daily  . cholecalciferol  1,000 Units Oral Daily  . clopidogrel  75 mg Oral Daily  . enoxaparin (LOVENOX) injection  40 mg Subcutaneous Q24H  . insulin glargine  28 Units Subcutaneous Daily  . levothyroxine  75 mcg Oral Once per day on Mon Tue Wed Thu Fri Sat  . metoprolol succinate  50 mg Oral QHS  . pregabalin  25 mg Oral BID  . rosuvastatin  20 mg Oral Daily  . venlafaxine  37.5 mg Oral BID WC   Continuous Infusions: . sodium chloride 100 mL/hr at 12/05/20 0521      LOS: 0 days   Charlynne Cousins  [facility]  12/05/2020, 7:02 AM

## 2020-12-05 NOTE — ED Notes (Signed)
Awaiting solu-cortef from pharmacy.

## 2020-12-05 NOTE — Discharge Summary (Signed)
Physician Discharge Summary  Cassandra Benson ZOX:[id number] DOB: July 12, 1941 DOA: 12/04/2020  PCP: [name], MD  Admit date: 12/04/2020 Discharge date: 12/05/2020  Admitted From: Home  Disposition: Home   Recommendations for Outpatient Follow-up:  1. Follow up with cardiology in 1-2 weeks 2. Please obtain BMP/CBC in one week  Home Health:none Equipment/Devices:None  Discharge Condition:Stable CODE STATUS:Full Diet recommendation: Heart Healthy  Brief/Interim Summary:  Cassandra Benson is an 80 y.o. female past medical history significant diabetes mellitus type 2, comes into the emergency room for left-sided chest pain, she also relates wheezing with no cough.  In the ED was found to have a BNP of 51 troponin was negative, chest x-ray showed no cardiopulmonary disease.   Discharge Diagnoses:  Active Problems:   Essential hypertension   Hyperlipidemia associated with type 2 diabetes mellitus (HCC)   Chest pain Atypical chest pain:  She describes a pleuritic component, her pain is reproducible by palpation. was admitted to the hospital cardiac biomarkers remain flat twelve-lead EKG showed normal sinus rhythm no ST abnormalities. CT angio of the chest was negative for PE lower extremity Dopplers negative for DVT. Influenza PCR SARS-CoV-2 is negative. 2D echo done in November of last year showed an EF of 81% grade 2 diastolic heart failure with no aortic stenosis.  Cardiology was consulted her heart score was 3 which is low risk cardiology recommended follow-up with them as an outpatient.  Diabetes mellitus type 2 with hyperlipidemia: No changes made to her medication.  Hypothyroidism: Continue Synthroid.  Essential hypertension: Continue Toprol and Cozaar blood pressure is well controlled.     Discharge Instructions  Discharge Instructions    Diet - low sodium heart healthy   Complete by: As directed    Increase activity slowly   Complete by: As directed       Allergies as of 12/05/2020      Reactions   Iodinated Diagnostic Agents Other (See Comments)   Itching (severe) and chest tightness   Penicillins Anaphylaxis, Swelling, Rash, Other (See Comments)   Has patient had a PCN reaction causing immediate rash, facial/tongue/throat swelling, SOB or lightheadedness with hypotension: Yes Has patient had a PCN reaction causing severe rash involving mucus membranes or skin necrosis: Unknown Has patient had a PCN reaction that required hospitalization: Unknown Has patient had a PCN reaction occurring within the last 10 years: No If all of the above answers are "NO", then may proceed with Cephalosporin use.   Amlodipine Swelling   Pedal edema   Anectine [succinylcholine] Other (See Comments)   Brother went into cardiac arrest.   Codeine Nausea Only   Gabapentin Other (See Comments)   Gait abnormality   Influenza Vaccines Other (See Comments)   Muscle weakness; unable to walk   Nortriptyline Other (See Comments)   Eye swelling and mouth drawed up   Pamelor [nortriptyline Hcl] Other (See Comments)   Patient states caused her face to draw in together.   Valsartan Other (See Comments), Cough   Allergy to generic only, "Hacking" cough   Zetia [ezetimibe] Other (See Comments)   Bad muscle cramps   Erythromycin Rash, Swelling   Sulfa Antibiotics Rash      Medication List    TAKE these medications   acetaminophen 500 MG tablet Commonly known as: TYLENOL Take 1 tablet (500 mg total) by mouth 3 (three) times daily as needed.   BD Ultra-Fine Lancets lancets Use as instructed upto 6 times daily   benzonatate 100 MG capsule Commonly known  as: TESSALON Take 1 capsule (100 mg total) by mouth 3 (three) times daily as needed for cough.   [name] tablet Commonly known as: PLAVIX TAKE 1 TABLET(75 MG) BY MOUTH DAILY   Dexcom G6 Transmitter Misc 1 kit by Does not apply route See admin instructions. Use to check blood sugar   diclofenac  Sodium 1 % Gel Commonly known as: VOLTAREN Apply 2 g topically 3 (three) times daily.   glucagon 1 MG injection Inject 1 mg into the muscle once as needed for up to 1 dose.   glucose blood test strip Use as instructed to check glucose 4 times daily.   insulin aspart 100 UNIT/ML injection Commonly known as: novoLOG USE UP TO 50 UNITS DAILY IN THE INSULIN PUMP   losartan 25 MG tablet Commonly known as: Cozaar Take 1 tablet (25 mg total) by mouth daily.   metoprolol succinate 50 MG 24 hr tablet Commonly known as: TOPROL-XL Take 1 tablet (50 mg total) by mouth at bedtime. Take with or immediately following a meal.   nitroGLYCERIN 0.4 MG SL tablet Commonly known as: NITROSTAT Place 1 tablet (0.4 mg total) under the tongue every 5 (five) minutes as needed for chest pain.   Pen Needles 32G X 5 MM Misc Use 1x a day   pregabalin 25 MG capsule Commonly known as: LYRICA Take 1 capsule (25 mg total) by mouth 2 (two) times daily.   rosuvastatin 20 MG tablet Commonly known as: CRESTOR Take 1 tablet (20 mg total) by mouth daily.   Synthroid 75 MCG tablet Generic drug: levothyroxine Take 1 tablet (75 mcg total) by mouth daily before breakfast. Don't take any on Sundays   Tresiba FlexTouch 100 UNIT/ML FlexTouch Pen Generic drug: insulin degludec Inject 28-30 Units into the skin daily.   venlafaxine 37.5 MG tablet Commonly known as: EFFEXOR Take 37.5 mg once a day for a week then increase to 37.5 mg twice a day and continue that dose   vitamin C 100 MG tablet Take 100 mg by mouth daily.   Vitamin D3 25 MCG (1000 UT) Caps Take 1 capsule (1,000 Units total) by mouth daily.       Allergies  Allergen Reactions  . Iodinated Diagnostic Agents Other (See Comments)    Itching (severe) and chest tightness  . Penicillins Anaphylaxis, Swelling, Rash and Other (See Comments)    Has patient had a PCN reaction causing immediate rash, facial/tongue/throat swelling, SOB or  lightheadedness with hypotension: Yes Has patient had a PCN reaction causing severe rash involving mucus membranes or skin necrosis: Unknown Has patient had a PCN reaction that required hospitalization: Unknown Has patient had a PCN reaction occurring within the last 10 years: No If all of the above answers are "NO", then may proceed with Cephalosporin use.   . Amlodipine Swelling    Pedal edema  . Anectine [Succinylcholine] Other (See Comments)    Brother went into cardiac arrest.  . Codeine Nausea Only  . Gabapentin Other (See Comments)    Gait abnormality  . Influenza Vaccines Other (See Comments)    Muscle weakness; unable to walk  . Nortriptyline Other (See Comments)    Eye swelling and mouth drawed up  . Pamelor [Nortriptyline Hcl] Other (See Comments)    Patient states caused her face to draw in together.  . Valsartan Other (See Comments) and Cough    Allergy to generic only, "Hacking" cough  . Zetia [Ezetimibe] Other (See Comments)    Bad muscle  cramps  . Erythromycin Rash and Swelling  . Sulfa Antibiotics Rash    Consultations:  Cardiology   Procedures/Studies: DG Chest 2 View  Result Date: 12/05/2020 CLINICAL DATA:  Chest pain, short of breath, weakness EXAM: CHEST - 2 VIEW COMPARISON:  03/05/2020 FINDINGS: The heart size and mediastinal contours are within normal limits. Both lungs are clear. The visualized skeletal structures are unremarkable. IMPRESSION: No active cardiopulmonary disease. Electronically Signed   By: Randa Ngo M.D.   On: 12/05/2020 01:13   DG Cervical Spine Complete  Result Date: 12/01/2020 CLINICAL DATA:  Neck pain. EXAM: CERVICAL SPINE - COMPLETE 4+ VIEW COMPARISON:  March 16, 2017 FINDINGS: There is straightening of the normal cervical lordotic curvature. There are degenerative changes at the C5-C6 level, similar to prior study. There is no acute compression fracture. No significant malalignment. There may be mild osseous neural foraminal  narrowing bilaterally at the C5-C6 level. A left-sided carotid artery stent is noted. IMPRESSION: 1. No acute abnormality. 2. Degenerative changes are again noted at the C5-C6 level. 3. Straightening of the normal cervical lordotic curvature may be secondary to patient positioning or muscle spasm. Electronically Signed   By: Constance Holster M.D.   On: 12/01/2020 22:36   CT Head Wo Contrast  Result Date: 11/19/2020 CLINICAL DATA:  Headache, new or worsening. EXAM: CT HEAD WITHOUT CONTRAST TECHNIQUE: Contiguous axial images were obtained from the base of the skull through the vertex without intravenous contrast. COMPARISON:  October 21, 21. FINDINGS: Brain: No evidence of acute large vascular territory infarction, hemorrhage, hydrocephalus, extra-axial collection or mass lesion/mass effect. Similar generalized cerebral atrophy and mild patchy white matter hypoattenuation likely related to chronic microvascular ischemic disease. Vascular: No hyperdense vessel identified. Calcific atherosclerosis. Skull: No acute fracture. Sinuses/Orbits: Mild mucosal thickening of the visualized ethmoid air cells. No visible air-fluid level. Visualized orbits are unremarkable. Other: No mastoid effusions. IMPRESSION: No evidence of acute intracranial abnormality. Electronically Signed   By: Margaretha Sheffield MD   On: 11/19/2020 14:23   CT Angio Chest PE W and/or Wo Contrast  Result Date: 12/05/2020 CLINICAL DATA:  80 year old female with sharp left chest pain. Weakness and shortness of breath. EXAM: CT ANGIOGRAPHY CHEST WITH CONTRAST TECHNIQUE: Multidetector CT imaging of the chest was performed using the standard protocol during bolus administration of intravenous contrast. Multiplanar CT image reconstructions and MIPs were obtained to evaluate the vascular anatomy. CONTRAST:  39m OMNIPAQUE IOHEXOL 350 MG/ML SOLN COMPARISON:  Chest radiographs 12/05/2020.  CTA chest 10/02/2017. FINDINGS: Cardiovascular: Good contrast bolus  timing in the pulmonary arterial tree. No focal filling defect identified in the pulmonary arteries to suggest acute pulmonary embolism. No definite calcified coronary artery atherosclerosis. Calcified aortic atherosclerosis. Stable visible aorta since 2019, no dissection or aneurysm. Stable cardiac size within normal limits. No pericardial effusion. Mediastinum/Nodes: Negative.  No lymphadenopathy. Lungs/Pleura: Major airways are patent. Similar somewhat low lung volumes to the 2019 CTA. Mild dependent atelectasis. Otherwise negative lungs. No pleural effusion. Upper Abdomen: Negative visible liver, gallbladder, spleen, pancreas, adrenal glands and bowel in the upper abdomen. Musculoskeletal: Negative for age. Review of the MIP images confirms the above findings. IMPRESSION: 1. Negative for acute pulmonary embolus. 2. Mild pulmonary atelectasis. 3.  Aortic Atherosclerosis (ICD10-I70.0). Electronically Signed   By: HGenevie AnnM.D.   On: 12/05/2020 06:13   UKoreaVenous Img Lower Bilateral  Result Date: 12/05/2020 CLINICAL DATA:  History of deep venous thrombosis, lower extremity pain EXAM: BILATERAL LOWER EXTREMITY VENOUS DOPPLER ULTRASOUND TECHNIQUE: Gray-scale  sonography with compression, as well as color and duplex ultrasound, were performed to evaluate the deep venous system(s) from the level of the common femoral vein through the popliteal and proximal calf veins. COMPARISON:  None. FINDINGS: VENOUS Normal compressibility of the common femoral, superficial femoral, and popliteal veins, as well as the visualized calf veins. Visualized portions of profunda femoral vein and great saphenous vein unremarkable. No filling defects to suggest DVT on grayscale or color Doppler imaging. Doppler waveforms show normal direction of venous flow, normal respiratory plasticity and response to augmentation. OTHER None. Limitations: none IMPRESSION: 1. No evidence of deep venous thrombosis within either lower extremity.  Electronically Signed   By: Randa Ngo M.D.   On: 12/05/2020 00:36    (Echo, Carotid, EGD, Colonoscopy, ERCP)    Subjective: No complaints feels great.  Discharge Exam: Vitals:   12/05/20 1100 12/05/20 1430  BP: (!) 136/58 (!) 130/58  Pulse: 71 97  Resp: 12 14  Temp:    SpO2: 96% 96%   Vitals:   12/05/20 0545 12/05/20 0953 12/05/20 1100 12/05/20 1430  BP: 132/66 137/72 (!) 136/58 (!) 130/58  Pulse: 63 73 71 97  Resp: '20 15 12 14  ' Temp:  98 F (36.7 C)    TempSrc:  Oral    SpO2: 97% 96% 96% 96%  Weight:      Height:        General: Pt is alert, awake, not in acute distress Cardiovascular: RRR, S1/S2 +, no rubs, no gallops Respiratory: CTA bilaterally, no wheezing, no rhonchi Abdominal: Soft, NT, ND, bowel sounds + Extremities: no edema, no cyanosis    The results of significant diagnostics from this hospitalization (including imaging, microbiology, ancillary and laboratory) are listed below for reference.     Microbiology: Recent Results (from the past 240 hour(s))  Resp Panel by RT-PCR (Flu A&B, Covid) Nasopharyngeal Swab     Status: None   Collection Time: 12/05/20  3:32 AM   Specimen: Nasopharyngeal Swab; Nasopharyngeal(NP) swabs in vial transport medium  Result Value Ref Range Status   SARS Coronavirus 2 by RT PCR NEGATIVE NEGATIVE Final    Comment: (NOTE) SARS-CoV-2 target nucleic acids are NOT DETECTED.  The SARS-CoV-2 RNA is generally detectable in upper respiratory specimens during the acute phase of infection. The lowest concentration of SARS-CoV-2 viral copies this assay can detect is 138 copies/mL. A negative result does not preclude SARS-Cov-2 infection and should not be used as the sole basis for treatment or other patient management decisions. A negative result may occur with  improper specimen collection/handling, submission of specimen other than nasopharyngeal swab, presence of viral mutation(s) within the areas targeted by this assay,  and inadequate number of viral copies(<138 copies/mL). A negative result must be combined with clinical observations, patient history, and epidemiological information. The expected result is Negative.  Fact Sheet for Patients:  EntrepreneurPulse.com.au  Fact Sheet for Healthcare Providers:  IncredibleEmployment.be  This test is no t yet approved or cleared by the [place] FDA and  has been authorized for detection and/or diagnosis of SARS-CoV-2 by FDA under an Emergency Use Authorization (EUA). This EUA will remain  in effect (meaning this test can be used) for the duration of the COVID-19 declaration under Section 564(b)(1) of the Act, 21 U.S.C.section 360bbb-3(b)(1), unless the authorization is terminated  or revoked sooner.       Influenza A by PCR NEGATIVE NEGATIVE Final   Influenza B by PCR NEGATIVE NEGATIVE Final    Comment: (NOTE)  The Xpert Xpress SARS-CoV-2/FLU/RSV plus assay is intended as an aid in the diagnosis of influenza from Nasopharyngeal swab specimens and should not be used as a sole basis for treatment. Nasal washings and aspirates are unacceptable for Xpert Xpress SARS-CoV-2/FLU/RSV testing.  Fact Sheet for Patients: EntrepreneurPulse.com.au  Fact Sheet for Healthcare Providers: IncredibleEmployment.be  This test is not yet approved or cleared by the [place] FDA and has been authorized for detection and/or diagnosis of SARS-CoV-2 by FDA under an Emergency Use Authorization (EUA). This EUA will remain in effect (meaning this test can be used) for the duration of the COVID-19 declaration under Section 564(b)(1) of the Act, 21 U.S.C. section 360bbb-3(b)(1), unless the authorization is terminated or revoked.  Performed at [facility], [address] [place], [place] [address]      Labs: BNP (last 3 results) Recent Labs    03/05/20 2208 12/05/20 0011  BNP  149.2* 65.6   Basic Metabolic Panel: Recent Labs  Lab 12/04/20 2155  NA 137  K 4.1  CL 106  CO2 24  GLUCOSE 126*  BUN 11  CREATININE 0.86  CALCIUM 9.3   Liver Function Tests: No results for input(s): AST, ALT, ALKPHOS, BILITOT, PROT, ALBUMIN in the last 168 hours. No results for input(s): LIPASE, AMYLASE in the last 168 hours. No results for input(s): AMMONIA in the last 168 hours. CBC: Recent Labs  Lab 12/04/20 2155  WBC 10.2  HGB 13.0  HCT 41.1  MCV 84.7  PLT 214   Cardiac Enzymes: No results for input(s): CKTOTAL, CKMB, CKMBINDEX, TROPONINI in the last 168 hours. BNP: Invalid input(s): POCBNP CBG: Recent Labs  Lab 12/05/20 0645 12/05/20 0825  GLUCAP 157* 174*   D-Dimer Recent Labs    12/05/20 0011  DDIMER 0.80*   Hgb A1c Recent Labs    12/03/20 1351  HGBA1C 9.7*   Lipid Profile Recent Labs    12/05/20 0332  CHOL 155  HDL 41  LDLCALC 94  TRIG 102  CHOLHDL 3.8   Thyroid function studies No results for input(s): TSH, T4TOTAL, T3FREE, THYROIDAB in the last 72 hours.  Invalid input(s): FREET3 Anemia work up No results for input(s): VITAMINB12, FOLATE, FERRITIN, TIBC, IRON, RETICCTPCT in the last 72 hours. Urinalysis    Component Value Date/Time   COLORURINE STRAW (A) 12/05/2020 0011   APPEARANCEUR CLEAR (A) 12/05/2020 0011   LABSPEC 1.005 12/05/2020 0011   PHURINE 7.0 12/05/2020 0011   GLUCOSEU NEGATIVE 12/05/2020 0011   HGBUR NEGATIVE 12/05/2020 0011   BILIRUBINUR NEGATIVE 12/05/2020 0011   BILIRUBINUR negative 10/07/2019 1033   KETONESUR NEGATIVE 12/05/2020 0011   PROTEINUR NEGATIVE 12/05/2020 0011   UROBILINOGEN 0.2 10/07/2019 1033   NITRITE NEGATIVE 12/05/2020 0011   LEUKOCYTESUR NEGATIVE 12/05/2020 0011   Sepsis Labs Invalid input(s): PROCALCITONIN,  WBC,  LACTICIDVEN Microbiology Recent Results (from the past 240 hour(s))  Resp Panel by RT-PCR (Flu A&B, Covid) Nasopharyngeal Swab     Status: None   Collection Time: 12/05/20   3:32 AM   Specimen: Nasopharyngeal Swab; Nasopharyngeal(NP) swabs in vial transport medium  Result Value Ref Range Status   SARS Coronavirus 2 by RT PCR NEGATIVE NEGATIVE Final    Comment: (NOTE) SARS-CoV-2 target nucleic acids are NOT DETECTED.  The SARS-CoV-2 RNA is generally detectable in upper respiratory specimens during the acute phase of infection. The lowest concentration of SARS-CoV-2 viral copies this assay can detect is 138 copies/mL. A negative result does not preclude SARS-Cov-2 infection and should not be used  as the sole basis for treatment or other patient management decisions. A negative result may occur with  improper specimen collection/handling, submission of specimen other than nasopharyngeal swab, presence of viral mutation(s) within the areas targeted by this assay, and inadequate number of viral copies(<138 copies/mL). A negative result must be combined with clinical observations, patient history, and epidemiological information. The expected result is Negative.  Fact Sheet for Patients:  EntrepreneurPulse.com.au  Fact Sheet for Healthcare Providers:  IncredibleEmployment.be  This test is no t yet approved or cleared by the [place] FDA and  has been authorized for detection and/or diagnosis of SARS-CoV-2 by FDA under an Emergency Use Authorization (EUA). This EUA will remain  in effect (meaning this test can be used) for the duration of the COVID-19 declaration under Section 564(b)(1) of the Act, 21 U.S.C.section 360bbb-3(b)(1), unless the authorization is terminated  or revoked sooner.       Influenza A by PCR NEGATIVE NEGATIVE Final   Influenza B by PCR NEGATIVE NEGATIVE Final    Comment: (NOTE) The Xpert Xpress SARS-CoV-2/FLU/RSV plus assay is intended as an aid in the diagnosis of influenza from Nasopharyngeal swab specimens and should not be used as a sole basis for treatment. Nasal washings and aspirates  are unacceptable for Xpert Xpress SARS-CoV-2/FLU/RSV testing.  Fact Sheet for Patients: EntrepreneurPulse.com.au  Fact Sheet for Healthcare Providers: IncredibleEmployment.be  This test is not yet approved or cleared by the [place] FDA and has been authorized for detection and/or diagnosis of SARS-CoV-2 by FDA under an Emergency Use Authorization (EUA). This EUA will remain in effect (meaning this test can be used) for the duration of the COVID-19 declaration under Section 564(b)(1) of the Act, 21 U.S.C. section 360bbb-3(b)(1), unless the authorization is terminated or revoked.  Performed at [facility], [address] [place], [place] [address]      Time coordinating discharge: Over 30 minutes  SIGNED:   Charlynne Cousins, MD  [facility] 12/05/2020, 2:54 PM Pager   If 7PM-7AM, please contact night-coverage [email or web address] Password [email or web address]

## 2020-12-09 ENCOUNTER — Encounter: Payer: Self-pay | Admitting: Family Medicine

## 2020-12-11 NOTE — Telephone Encounter (Signed)
Replied via other message.

## 2020-12-14 ENCOUNTER — Encounter: Payer: Self-pay | Admitting: Family Medicine

## 2020-12-14 DIAGNOSIS — I700 Atherosclerosis of aorta: Secondary | ICD-10-CM

## 2020-12-16 DIAGNOSIS — I700 Atherosclerosis of aorta: Secondary | ICD-10-CM | POA: Insufficient documentation

## 2020-12-22 ENCOUNTER — Encounter: Payer: Self-pay | Admitting: Family Medicine

## 2020-12-22 ENCOUNTER — Other Ambulatory Visit: Payer: Self-pay

## 2020-12-22 ENCOUNTER — Telehealth: Payer: Self-pay

## 2020-12-22 ENCOUNTER — Ambulatory Visit (INDEPENDENT_AMBULATORY_CARE_PROVIDER_SITE_OTHER): Payer: HMO | Admitting: Family Medicine

## 2020-12-22 VITALS — BP 136/70 | HR 87 | Temp 97.9°F | Ht 63.5 in | Wt 159.4 lb

## 2020-12-22 DIAGNOSIS — Z8249 Family history of ischemic heart disease and other diseases of the circulatory system: Secondary | ICD-10-CM

## 2020-12-22 DIAGNOSIS — R079 Chest pain, unspecified: Secondary | ICD-10-CM | POA: Diagnosis not present

## 2020-12-22 DIAGNOSIS — I10 Essential (primary) hypertension: Secondary | ICD-10-CM

## 2020-12-22 DIAGNOSIS — E139 Other specified diabetes mellitus without complications: Secondary | ICD-10-CM

## 2020-12-22 DIAGNOSIS — I700 Atherosclerosis of aorta: Secondary | ICD-10-CM

## 2020-12-22 NOTE — Patient Instructions (Signed)
Reproducible chest wall pain suggests musculoskeletal cause, could be costochondritis - treat with topical anti inflammatory voltaren gel and heating pad.  Keep follow up with Dr Rockey Situ.   Costochondritis  Costochondritis is inflammation of the tissue (cartilage) that connects the ribs to the breastbone (sternum). This causes pain in the front of the chest. The pain usually starts slowly and involves more than one rib. What are the causes? The exact cause of this condition is not always known. It results from stress on the cartilage where your ribs attach to your sternum. The cause of this stress could be:  Chest injury.  Exercise or activity, such as lifting.  Severe coughing. What increases the risk? You are more likely to develop this condition if you:  Are female.  Are 86-85 years old.  Recently started a new exercise or work activity.  Have low levels of vitamin D.  Have a condition that makes you cough frequently. What are the signs or symptoms? The main symptom of this condition is chest pain. The pain:  Usually starts gradually and can be sharp or dull.  Gets worse with deep breathing, coughing, or exercise.  Gets better with rest.  May be worse when you press on the affected area of your ribs and sternum. How is this diagnosed? This condition is diagnosed based on your symptoms, your medical history, and a physical exam. Your health care provider will check for pain when pressing on your sternum. You may also have tests to rule out other causes of chest pain. These may include:  A chest X-ray to check for lung problems.  An ECG (electrocardiogram) to see if you have a heart problem that could be causing the pain.  An imaging scan to rule out a chest or rib fracture. How is this treated? This condition usually goes away on its own over time. Your health care provider may prescribe an NSAID, such as ibuprofen, to reduce pain and inflammation. Treatment may also  include:  Resting and avoiding activities that make pain worse.  Applying heat or ice to the area to reduce pain and inflammation.  Doing exercises to stretch your chest muscles. If these treatments do not help, your health care provider may inject a numbing medicine at the sternum-rib connection to help relieve the pain. Follow these instructions at home: Managing pain, stiffness, and swelling  If directed, put ice on the painful area. To do this: ? Put ice in a plastic bag. ? Place a towel between your skin and the bag. ? Leave the ice on for 20 minutes, 2-3 times a day.  If directed, apply heat to the affected area as often as told by your health care provider. Use the heat source that your health care provider recommends, such as a moist heat pack or a heating pad. ? Place a towel between your skin and the heat source. ? Leave the heat on for 20-30 minutes. ? Remove the heat if your skin turns bright red. This is especially important if you are unable to feel pain, heat, or cold. You may have a greater risk of getting burned.      Activity  Rest as told by your health care provider. Avoid activities that make pain worse. This includes any activities that use chest, abdominal, and side muscles.  Do not lift anything that is heavier than 10 lb (4.5 kg), or the limit that you are told, until your health care provider says that it is safe.  Return  to your normal activities as told by your health care provider. Ask your health care provider what activities are safe for you. General instructions  Take over-the-counter and prescription medicines only as told by your health care provider.  Keep all follow-up visits as told by your health care provider. This is important. Contact a health care provider if:  You have chills or a fever.  Your pain does not go away or it gets worse.  You have a cough that does not go away. Get help right away if:  You have shortness of  breath.  You have severe chest pain that is not relieved by medicines, heat, or ice. These symptoms may represent a serious problem that is an emergency. Do not wait to see if the symptoms will go away. Get medical help right away. Call your local emergency services (911 in the [place] Do not drive yourself to the hospital.  Summary  Costochondritis is inflammation of the tissue (cartilage) that connects the ribs to the breastbone (sternum).  This condition causes pain in the front of the chest.  Costochondritis results from stress on the cartilage where your ribs attach to your sternum.  Treatment may include medicines, rest, heat or ice, and exercises. This information is not intended to replace advice given to you by your health care provider. Make sure you discuss any questions you have with your health care provider. Document Revised: 07/26/2019 Document Reviewed: 07/26/2019 Elsevier Patient Education  2021 [employer].

## 2020-12-22 NOTE — Assessment & Plan Note (Signed)
Overall atypical symptoms - anticipate MSK cause given reproducibility. Will treat with voltaren gel and heating pad, update with effect.  Given risk factors, I'm glad she has upcoming cardiology evaluation as well.

## 2020-12-22 NOTE — Assessment & Plan Note (Signed)
Chronic, stable period - continue current regimen.

## 2020-12-22 NOTE — Assessment & Plan Note (Signed)
Incidental finding on recent imaging. Reviewed with patient, encouraged renewed efforts at tight glycemic, blood pressure and cholesterol control to arrest further plaque development.

## 2020-12-22 NOTE — Assessment & Plan Note (Signed)
Appreciate endo care - next visit is in a few monts

## 2020-12-22 NOTE — Chronic Care Management (AMB) (Addendum)
Chronic Care Management Pharmacy Assistant   Name: Cassandra Benson  MRN: [id number] DOB: 1941/03/29  Reason for Encounter: Medication Review- Medication Adherence and Delivery Coordination   Recent office visits:  12/22/20- Dr. Danise Mina- PCP 12/04/20- Urgent Care- Chest pain- sent to ED 12/01/20- Dr. Danise Mina- PCP  Recent consult visits:  12/03/20- Dr. Cruzita Lederer- Endocrinology- Increased [name] from 26 units to 28-30 units daily  Hospital visits:  Medication Reconciliation was completed by comparing discharge summary, patient's EMR and Pharmacy list, and upon discussion with patient. -Visited the ED on 12/04/20 due to chest pain. She was not admitted. No medications were changed. -Visited the ED on 11/19/20 due to headache. She was not admitted. No medications were changed. -Visited the ED on 07/16/20 due to headache. She was not admitted. No medications were changed.   Medications: Outpatient Encounter Medications as of 12/22/2020  Medication Sig Note   acetaminophen (TYLENOL) 500 MG tablet Take 1 tablet (500 mg total) by mouth 3 (three) times daily as needed.    Ascorbic Acid (VITAMIN C) 100 MG tablet Take 100 mg by mouth daily.    BD ULTRA-FINE LANCETS lancets Use as instructed upto 6 times daily    benzonatate (TESSALON) 100 MG capsule Take 1 capsule (100 mg total) by mouth 3 (three) times daily as needed for cough.    Cholecalciferol (VITAMIN D3) 25 MCG (1000 UT) CAPS Take 1 capsule (1,000 Units total) by mouth daily.    clopidogrel (PLAVIX) 75 MG tablet TAKE 1 TABLET(75 MG) BY MOUTH DAILY    Continuous Blood Gluc Transmit (DEXCOM G6 TRANSMITTER) MISC 1 kit by Does not apply route See admin instructions. Use to check blood sugar    diclofenac Sodium (VOLTAREN) 1 % GEL Apply 2 g topically 3 (three) times daily.    glucagon 1 MG injection Inject 1 mg into the muscle once as needed for up to 1 dose.    glucose blood test strip Use as instructed to check glucose 4 times daily.     insulin aspart (NOVOLOG) 100 UNIT/ML injection USE UP TO 50 UNITS DAILY IN THE INSULIN PUMP 12/03/2020: Pt using pens temporarily until pump is restarted   insulin degludec (TRESIBA FLEXTOUCH) 100 UNIT/ML FlexTouch Pen Inject 28-30 Units into the skin daily.    Insulin Pen Needle (PEN NEEDLES) 32G X 5 MM MISC Use 1x a day    losartan (COZAAR) 25 MG tablet Take 1 tablet (25 mg total) by mouth daily.    metoprolol succinate (TOPROL-XL) 50 MG 24 hr tablet Take 1 tablet (50 mg total) by mouth at bedtime. Take with or immediately following a meal.    nitroGLYCERIN (NITROSTAT) 0.4 MG SL tablet Place 1 tablet (0.4 mg total) under the tongue every 5 (five) minutes as needed for chest pain.    pregabalin (LYRICA) 25 MG capsule Take 1 capsule (25 mg total) by mouth 2 (two) times daily.    rosuvastatin (CRESTOR) 20 MG tablet Take 1 tablet (20 mg total) by mouth daily.    SYNTHROID 75 MCG tablet Take 1 tablet (75 mcg total) by mouth daily before breakfast. Don't take any on Sundays    venlafaxine (EFFEXOR) 37.5 MG tablet Take 37.5 mg once a day for a week then increase to 37.5 mg twice a day and continue that dose    No facility-administered encounter medications on file as of 12/22/2020.   BP Readings from Last 3 Encounters:  12/22/20 136/70  12/05/20 130/62  12/03/20 128/70    Lab  Results  Component Value Date   HGBA1C 9.7 (A) 12/03/2020    Multiple attempts to make contact with patient on 3/29, 3/30 and 12/24/20. Unsuccessful outreach.  Recent OV, Consult or Hospital visit:   12/22/20- Dr. Danise Mina- PCP 12/04/20- ED- Chest pain 12/01/20- Dr. Danise Mina- PCP 12/03/20- Dr. Cruzita Lederer- Endocrinology- Increased [name] from 26 units to 28-30 units daily  No medication changes indicated  Patient obtains medications through Adherence Packaging  30 Days   Last adherence delivery date: N/A First adherence delivery from [facility]    Patient is due for next adherence delivery on: 12/30/20  Contacted patient  and reviewed medications and coordinated delivery.- left multiple messages on patients voicemail  This delivery to include: Adherence Packaging  30 Days  Packs: [name] 1 tablet daily (evening meal) losartan  25 MG 1 tablet daily (Breakfast) metoprolol succinate50 MG 1 tablet daily (bedtime) pregabalin  25 MG 1 tablet twice daily (breakfast, evening meal) Rosuvastatin 20 MG 1 tablet daily (evening meal) SYNTHROID 75 MCG tablet *Brand name only*  1 tablet daily except sundays (before breakfast) Venlafaxine 37.5 mg tablet  1 tablet daily (breakfast)  PRN/VIAL medications: One Touch Ultra Blue Meter Glucose Test Strips   [name] injection - Sliding scale. Use up to 50 units daily TRESIBA FLEXTOUCH 100 UNIT/ML - Inject 24-26 units once daily Insulin Pen Needle 32G X 4 MM   Patient declined the following medications this month: Benzonatate 100 mg- discontinued Clindamycin- discontinued Diclofenac 1% topical gel- PRN use glucagon 1 MG injection (PRN) Zetia 10 mg- 1 tablet - discontinued  Patient needs refills on the following medications:  [name] injection - Sliding scale. Use up to 50 units daily- Dr. Cruzita Lederer  Requested refill on 11/25/20 from Dr. Cruzita Lederer  Confirmed delivery date of 12/30/20, advised patient that pharmacy will contact her the morning of delivery.  Unable to reach patient to review any BG or BP readings  Follow-Up:  Coordination of Enhanced Pharmacy Services and Pharmacist Review  Debbora Dus, CPP notified  Margaretmary Dys, [place] [phone]  I have reviewed the care management and care coordination activities outlined in this encounter and I am certifying that I agree with the content of this note. No further action required.  Debbora Dus, PharmD Clinical Pharmacist [facility] at [facility] [phone]

## 2020-12-22 NOTE — Progress Notes (Signed)
Patient ID: Cassandra Benson, female    DOB: 1941-04-29, 80 y.o.   MRN: [id number]  This visit was conducted in person.  BP 136/70   Pulse 87   Temp 97.9 F (36.6 C) (Temporal)   Ht 5' 3.5" (1.613 m)   Wt 159 lb 6 oz (72.3 kg)   SpO2 97%   BMI 27.79 kg/m    CC: hospitalization f/u visit  Subjective:   HPI: Cassandra Benson is a 80 y.o. female presenting on 12/22/2020 for Hospitalization Follow-up (Seen on 12/04/20 at [facility] ED, dx nonspecific chest pain.)   Recent ER visit 12/04/2020 for chest pain s/p reassuring evaluation. Records reviewed. Presented with severe left sided chest pain described as pressure tightness and sharp. No associated injury but she had dyspnea, dizziness, nausea, generalized fatigue and weakness. Workup included EKG, cycled cardiac enzymes, CXR. D dimer was elevated - venous US negative for DVT.  CTA IMPRESSION: 1. Negative for acute pulmonary embolus. 2. Mild pulmonary atelectasis. 3.  Aortic Atherosclerosis (ICD10-I70.0).  Admitted for 24 hour observation.  Cardiology consulted - ?esoph spasm vs coronary spasm vs angina.   To see cards in May - discussing possible stress test.  DM - tresiba is helping control sugars better than previous regimens - recent fasting cbg 160s.  She has cut out all artifical sweeteners. Using agave instead.    Admit date: 12/04/2020 Discharge date: 12/05/2020 TCM phone call not completed   Discharge Diagnoses:  Active Problems:   Essential hypertension   Hyperlipidemia associated with type 2 diabetes mellitus ([place])   Chest pain  Admitted From: Home  Disposition: Home   Recommendations for Outpatient Follow-up:  1. Follow up with cardiology in 1-2 weeks 2. Please obtain BMP/CBC in one week  Home Health:none Equipment/Devices:None  Discharge Condition:Stable CODE STATUS:Full Diet recommendation: Heart Healthy     Relevant past medical, surgical, family and social history reviewed and updated as indicated.  Interim medical history since our last visit reviewed. Allergies and medications reviewed and updated. Outpatient Medications Prior to Visit  Medication Sig Dispense Refill  . acetaminophen (TYLENOL) 500 MG tablet Take 1 tablet (500 mg total) by mouth 3 (three) times daily as needed.    . Ascorbic Acid (VITAMIN C) 100 MG tablet Take 100 mg by mouth daily.    . BD ULTRA-FINE LANCETS lancets Use as instructed upto 6 times daily 600 each 2  . benzonatate (TESSALON) 100 MG capsule Take 1 capsule (100 mg total) by mouth 3 (three) times daily as needed for cough. 30 capsule 3  . Cholecalciferol (VITAMIN D3) 25 MCG (1000 UT) CAPS Take 1 capsule (1,000 Units total) by mouth daily. 30 capsule   . clopidogrel (PLAVIX) 75 MG tablet TAKE 1 TABLET(75 MG) BY MOUTH DAILY 30 tablet 6  . Continuous Blood Gluc Transmit (DEXCOM G6 TRANSMITTER) MISC 1 kit by Does not apply route See admin instructions. Use to check blood sugar 1 each 1  . diclofenac Sodium (VOLTAREN) 1 % GEL Apply 2 g topically 3 (three) times daily. 100 g 3  . glucagon 1 MG injection Inject 1 mg into the muscle once as needed for up to 1 dose. 1 each 12  . glucose blood test strip Use as instructed to check glucose 4 times daily. 400 each 3  . insulin aspart (NOVOLOG) 100 UNIT/ML injection USE UP TO 50 UNITS DAILY IN THE INSULIN PUMP 50 mL 0  . insulin degludec (TRESIBA FLEXTOUCH) 100 UNIT/ML FlexTouch Pen Inject 28-30 Units  into the skin daily. 18 mL 1  . Insulin Pen Needle (PEN NEEDLES) 32G X 5 MM MISC Use 1x a day 100 each 3  . losartan (COZAAR) 25 MG tablet Take 1 tablet (25 mg total) by mouth daily. 90 tablet 3  . metoprolol succinate (TOPROL-XL) 50 MG 24 hr tablet Take 1 tablet (50 mg total) by mouth at bedtime. Take with or immediately following a meal. 90 tablet 1  . nitroGLYCERIN (NITROSTAT) 0.4 MG SL tablet Place 1 tablet (0.4 mg total) under the tongue every 5 (five) minutes as needed for chest pain. 30 tablet 1  . pregabalin (LYRICA) 25  MG capsule Take 1 capsule (25 mg total) by mouth 2 (two) times daily.    . rosuvastatin (CRESTOR) 20 MG tablet Take 1 tablet (20 mg total) by mouth daily. 90 tablet 3  . SYNTHROID 75 MCG tablet Take 1 tablet (75 mcg total) by mouth daily before breakfast. Don't take any on Sundays 80 tablet 1  . venlafaxine (EFFEXOR) 37.5 MG tablet Take 37.5 mg once a day for a week then increase to 37.5 mg twice a day and continue that dose     No facility-administered medications prior to visit.     Per HPI unless specifically indicated in ROS section below Review of Systems Objective:  BP 136/70   Pulse 87   Temp 97.9 F (36.6 C) (Temporal)   Ht 5' 3.5" (1.613 m)   Wt 159 lb 6 oz (72.3 kg)   SpO2 97%   BMI 27.79 kg/m   Wt Readings from Last 3 Encounters:  12/22/20 159 lb 6 oz (72.3 kg)  12/04/20 161 lb (73 kg)  12/03/20 161 lb 6.4 oz (73.2 kg)      Physical Exam Vitals and nursing note reviewed.  Constitutional:      Appearance: Normal appearance. She is not ill-appearing.  Cardiovascular:     Rate and Rhythm: Normal rate and regular rhythm.     Pulses: Normal pulses.     Heart sounds: Normal heart sounds. No murmur heard.   Pulmonary:     Effort: Pulmonary effort is normal. No respiratory distress.     Breath sounds: Normal breath sounds. No wheezing, rhonchi or rales.  Chest:     Chest wall: Tenderness present.       Comments: Discomfort to palpation midline lower sternum as well as L>R upper costochondral junction  Musculoskeletal:     Right lower leg: No edema.     Left lower leg: No edema.  Skin:    General: Skin is warm and dry.     Findings: No rash.  Neurological:     Mental Status: She is alert.  Psychiatric:        Mood and Affect: Mood normal.        Behavior: Behavior normal.       Assessment & Plan:  This visit occurred during the SARS-CoV-2 public health emergency.  Safety protocols were in place, including screening questions prior to the visit, additional  usage of staff PPE, and extensive cleaning of exam room while observing appropriate contact time as indicated for disinfecting solutions.   Problem List Items Addressed This Visit    Essential hypertension - Primary    Chronic, stable period - continue current regimen.       Chest pain    Overall atypical symptoms - anticipate MSK cause given reproducibility. Will treat with voltaren gel and heating pad, update with effect.  Given risk factors,  I'm glad she has upcoming cardiology evaluation as well.       Family history of premature CAD   LADA (latent autoimmune diabetes in adults), managed as type 1 ([place])    Appreciate endo care - next visit is in a few monts      Atherosclerosis of aorta ([place])    Incidental finding on recent imaging. Reviewed with patient, encouraged renewed efforts at tight glycemic, blood pressure and cholesterol control to arrest further plaque development.           No orders of the defined types were placed in this encounter.  No orders of the defined types were placed in this encounter.   Patient instructions: Reproducible chest wall pain suggests musculoskeletal cause, could be costochondritis - treat with topical anti inflammatory voltaren gel and heating pad.  Keep follow up with Dr Rockey Situ.   Follow up plan: Return if symptoms worsen or fail to improve.  [name], MD

## 2020-12-24 ENCOUNTER — Telehealth: Payer: Self-pay

## 2020-12-24 DIAGNOSIS — H9313 Tinnitus, bilateral: Secondary | ICD-10-CM | POA: Diagnosis not present

## 2020-12-24 DIAGNOSIS — R413 Other amnesia: Secondary | ICD-10-CM | POA: Diagnosis not present

## 2020-12-24 DIAGNOSIS — R202 Paresthesia of skin: Secondary | ICD-10-CM | POA: Diagnosis not present

## 2020-12-24 DIAGNOSIS — F40298 Other specified phobia: Secondary | ICD-10-CM | POA: Diagnosis not present

## 2020-12-24 DIAGNOSIS — H547 Unspecified visual loss: Secondary | ICD-10-CM | POA: Diagnosis not present

## 2020-12-24 DIAGNOSIS — H53149 Visual discomfort, unspecified: Secondary | ICD-10-CM | POA: Diagnosis not present

## 2020-12-24 DIAGNOSIS — E139 Other specified diabetes mellitus without complications: Secondary | ICD-10-CM

## 2020-12-24 DIAGNOSIS — R208 Other disturbances of skin sensation: Secondary | ICD-10-CM | POA: Diagnosis not present

## 2020-12-24 DIAGNOSIS — R253 Fasciculation: Secondary | ICD-10-CM | POA: Diagnosis not present

## 2020-12-24 DIAGNOSIS — M329 Systemic lupus erythematosus, unspecified: Secondary | ICD-10-CM | POA: Diagnosis not present

## 2020-12-24 DIAGNOSIS — R519 Headache, unspecified: Secondary | ICD-10-CM | POA: Diagnosis not present

## 2020-12-24 MED ORDER — INSULIN ASPART 100 UNIT/ML ~~LOC~~ SOLN: SUBCUTANEOUS | 0 refills | Status: DC

## 2020-12-24 NOTE — Telephone Encounter (Signed)
Pharmacy called in stating they need a refill sent in for patient    insulin aspart (NOVOLOG) 100 UNIT/ML injection  [facility] - [place], [place] - [address]

## 2020-12-24 NOTE — Telephone Encounter (Signed)
Rx sent to preferred pharmacy.

## 2020-12-25 ENCOUNTER — Other Ambulatory Visit: Payer: Self-pay | Admitting: Physician Assistant

## 2020-12-25 DIAGNOSIS — R519 Headache, unspecified: Secondary | ICD-10-CM

## 2020-12-29 ENCOUNTER — Encounter: Payer: Self-pay | Admitting: Family Medicine

## 2020-12-29 ENCOUNTER — Telehealth: Payer: Self-pay

## 2020-12-29 NOTE — Telephone Encounter (Signed)
Patient called to confirm rosuvastatin dose - 10 or 20 mg daily. She was sent rosuvastatin 10 mg on 12/07/20 from [facility]. Prescription on file at pharmacy is 10 mg daily from [facility] transfer. Per chart review, pt is supposed to be 20 mg daily. Sending patient correct dose tomorrow in adherence packaging. Pt confirms understanding.  Debbora Dus, PharmD Clinical Pharmacist [facility] at [facility] [phone]

## 2020-12-31 ENCOUNTER — Encounter: Payer: Self-pay | Admitting: Family Medicine

## 2021-01-07 ENCOUNTER — Encounter: Payer: Self-pay | Admitting: Family Medicine

## 2021-01-07 ENCOUNTER — Other Ambulatory Visit: Payer: Self-pay

## 2021-01-07 ENCOUNTER — Ambulatory Visit
Admission: RE | Admit: 2021-01-07 | Discharge: 2021-01-07 | Disposition: A | Discharge status: Home / Self Care | Payer: HMO | Source: Ambulatory Visit | Attending: Physician Assistant | Admitting: Physician Assistant

## 2021-01-07 DIAGNOSIS — R519 Headache, unspecified: Secondary | ICD-10-CM | POA: Diagnosis not present

## 2021-01-07 DIAGNOSIS — R200 Anesthesia of skin: Secondary | ICD-10-CM | POA: Diagnosis not present

## 2021-01-07 NOTE — Telephone Encounter (Signed)
Will you plz triage pt?

## 2021-01-07 NOTE — Telephone Encounter (Signed)
I spoke with pt and the symptoms in my chart note happened earlier in the week. Today pt is not having any problems, no swelling, pain, redness or warmth. Pt also noted when has leg swelling like that her toes turn purple. No CP or SOB. Pt just wanted Dr Darnell Level to be aware of what happened earlier in the wk. Pt keeps feet up when sitting and wearing compression hose. UC &ED precautions given and pt voiced understanding. Pt said she knows after the holiday Dr Darnell Level will respond with his thoughts.sending note to DR G who is out of office and Dr Damita Dunnings who is in office and Lattie Haw CMA.

## 2021-01-08 NOTE — Telephone Encounter (Signed)
In the absence of sx, I will defer to PCP.

## 2021-01-13 ENCOUNTER — Telehealth: Payer: Self-pay | Admitting: Family Medicine

## 2021-01-13 NOTE — Telephone Encounter (Signed)
Pt called to schedule an appointment to follow up on brain scan. She states she wanted to discuss the results she received. I scheduled her for next available on 5/2 but she was wondering if there was any chance she can be seen sooner. Please advise. She stated there was a change to the white matter.

## 2021-01-14 NOTE — Telephone Encounter (Addendum)
I reviewed MRI report - chronic ischemic changes without new acute findings - this was also seen at last MRI 2021. This is common finding as we grow older. No need for sooner appointment.  Agree with f/u with neuro who ordered exam - looks like she has this scheduled at end of next month.

## 2021-01-14 NOTE — Telephone Encounter (Signed)
Spoke with pt relaying Dr. Synthia Innocent message.  Pt verbalizes understanding, expresses her thanks and requests to cancel 01/25/21 OV.   C/x appt.

## 2021-01-14 NOTE — Telephone Encounter (Signed)
It looks like Neurology ordered this imaging study. Has she spoken with their office. I'm sure Dr. Darnell Level will also review and discuss but would recommend she contact the ordering provider as well.

## 2021-01-15 ENCOUNTER — Telehealth: Payer: Self-pay

## 2021-01-15 NOTE — Chronic Care Management (AMB) (Addendum)
Chronic Care Management Pharmacy Assistant   Name: ELEANORA GUINYARD  MRN: [id number] DOB: [birth year]  Reason for Encounter:  Reminder for CCM Appointment 01/19/21 at 10:00 AM   Medications: Outpatient Encounter Medications as of 01/15/2021  Medication Sig   acetaminophen (TYLENOL) 500 MG tablet Take 1 tablet (500 mg total) by mouth 3 (three) times daily as needed.   Ascorbic Acid (VITAMIN C) 100 MG tablet Take 100 mg by mouth daily.   BD ULTRA-FINE LANCETS lancets Use as instructed upto 6 times daily   benzonatate (TESSALON) 100 MG capsule Take 1 capsule (100 mg total) by mouth 3 (three) times daily as needed for cough.   Cholecalciferol (VITAMIN D3) 25 MCG (1000 UT) CAPS Take 1 capsule (1,000 Units total) by mouth daily.   clopidogrel (PLAVIX) 75 MG tablet TAKE 1 TABLET(75 MG) BY MOUTH DAILY   Continuous Blood Gluc Transmit (DEXCOM G6 TRANSMITTER) MISC 1 kit by Does not apply route See admin instructions. Use to check blood sugar   diclofenac Sodium (VOLTAREN) 1 % GEL Apply 2 g topically 3 (three) times daily.   glucagon 1 MG injection Inject 1 mg into the muscle once as needed for up to 1 dose.   glucose blood test strip Use as instructed to check glucose 4 times daily.   insulin aspart (NOVOLOG) 100 UNIT/ML injection USE UP TO 50 UNITS DAILY IN THE INSULIN PUMP   insulin degludec (TRESIBA FLEXTOUCH) 100 UNIT/ML FlexTouch Pen Inject 28-30 Units into the skin daily.   Insulin Pen Needle (PEN NEEDLES) 32G X 5 MM MISC Use 1x a day   losartan (COZAAR) 25 MG tablet Take 1 tablet (25 mg total) by mouth daily.   metoprolol succinate (TOPROL-XL) 50 MG 24 hr tablet Take 1 tablet (50 mg total) by mouth at bedtime. Take with or immediately following a meal.   nitroGLYCERIN (NITROSTAT) 0.4 MG SL tablet Place 1 tablet (0.4 mg total) under the tongue every 5 (five) minutes as needed for chest pain.   pregabalin (LYRICA) 25 MG capsule Take 1 capsule (25 mg total) by mouth 2 (two) times daily.    rosuvastatin (CRESTOR) 20 MG tablet Take 1 tablet (20 mg total) by mouth daily.   SYNTHROID 75 MCG tablet Take 1 tablet (75 mcg total) by mouth daily before breakfast. Don't take any on Sundays   venlafaxine (EFFEXOR) 37.5 MG tablet Take 37.5 mg once a day for a week then increase to 37.5 mg twice a day and continue that dose   No facility-administered encounter medications on file as of 01/15/2021.   PRARTHANA PARLIN was contacted to remind her of her upcoming telephone visit with Debbora Dus on 01/19/21 at 10:00.  She was reminded to have all medications, supplements and any blood glucose and blood pressure readings available for review at appointment.   Are you having any problems with your medications? Denies any problems at this time  What concerns would you like to discuss with the pharmacist? No concerns at this time.    Star Rating Drugs: Medication:  Last Fill: Day Supply Losartan 25 mg 12/29/20  30 Rosuvastatin 20 mg 12/30/20  [place], CPP notified  Margaretmary Dys, [place] Assistant [phone]  I have reviewed the care management and care coordination activities outlined in this encounter and I am certifying that I agree with the content of this note. No further action required.  Debbora Dus, PharmD Clinical Pharmacist [facility] at [facility] [phone]

## 2021-01-18 ENCOUNTER — Encounter: Payer: Self-pay | Admitting: Family Medicine

## 2021-01-19 ENCOUNTER — Other Ambulatory Visit: Payer: Self-pay

## 2021-01-19 ENCOUNTER — Ambulatory Visit (INDEPENDENT_AMBULATORY_CARE_PROVIDER_SITE_OTHER): Payer: HMO

## 2021-01-19 DIAGNOSIS — E785 Hyperlipidemia, unspecified: Secondary | ICD-10-CM

## 2021-01-19 DIAGNOSIS — E1169 Type 2 diabetes mellitus with other specified complication: Secondary | ICD-10-CM | POA: Diagnosis not present

## 2021-01-19 DIAGNOSIS — I10 Essential (primary) hypertension: Secondary | ICD-10-CM | POA: Diagnosis not present

## 2021-01-19 NOTE — Progress Notes (Signed)
Chronic Care Management Pharmacy Note  01/19/21 Name:  Cassandra Benson MRN:  [id number] DOB:  June 09, 1941  Subjective: Cassandra Benson is an 80 y.o. year old female who is a primary patient of [name], MD.  The CCM team was consulted for assistance with disease management and care coordination needs.    Engaged with patient by telephone for follow up visit in response to provider referral for pharmacy case management and/or care coordination services.   Consent to Services:  The patient was given information about Chronic Care Management services, agreed to services, and gave verbal consent prior to initiation of services.  Please see initial visit note for detailed documentation.   Patient Care Team: [name], MD as PCP - General (Family Medicine) Minna Merritts, MD as PCP - Cardiology (Cardiology) Birder Robson, MD as Referring Physician (Ophthalmology) Debbora Dus, [facility] as Pharmacist (Pharmacist)  Recent office visits:   12/22/20- Dr. Eula Fried - hospital follow up - HTN stable control; Chest pain, atypical symptoms, treat with topical antiinflammatory voltaren gel and heating pad. LADA, followed by endocrinology, ASCVD, encouraged tight glycemic control.  12/01/20- Dr. Danise Mina - Neck pain, headache - ER visit follow up chronic headaches. She has started Effexor. X-ray today. Trial Lyrica 25 mg twice daily. Update [place] with effect.  Recent consult visits:   01/07/21 - MRI   12/24/20 - Neurology - MRI scheduled. Start Emgality monthly injectable. - Refill Effexor 37.5 mg twice a day. Continue taking Lyrica 25 mg twice a day  12/03/20- Dr. Cruzita Lederer- Endocrinology- Increased [name] from 26 units to 28-30 units daily. Continue Novolog based on ICR and target. A1c better 9.7%. Continue levothyroxine 75 mcg 6 out of 7 days per week.   Hospital visits:  -Visited the ED on 12/04/20 due to chest pain. She was not admitted. No medications were  changed. -Visited the ED on 11/19/20 due to headache. She was not admitted. No medications were changed. -Visited the ED on 07/16/20 due to headache. She was not admitted. No medications were changed.  Objective:  Lab Results  Component Value Date   CREATININE 0.86 12/04/2020   BUN 11 12/04/2020   GFR 59.00 (L) 09/09/2020   GFRNONAA >60 12/04/2020   GFRAA >60 03/05/2020   NA 137 12/04/2020   K 4.1 12/04/2020   CALCIUM 9.3 12/04/2020   CO2 24 12/04/2020   GLUCOSE 126 (H) 12/04/2020    Lab Results  Component Value Date/Time   HGBA1C 9.7 (A) 12/03/2020 01:51 PM   HGBA1C 12.0 (A) 09/04/2020 02:58 PM   HGBA1C 8.4 (H) 01/15/2020 05:01 AM   HGBA1C 9.7 (H) 09/10/2019 07:49 AM   GFR 59.00 (L) 09/09/2020 07:40 AM   GFR 56.32 (L) 10/07/2019 09:43 AM   MICROALBUR 0.1 10/23/2014 02:31 PM   MICROALBUR 0.8 07/17/2014 09:49 AM    Last diabetic Eye exam:  Lab Results  Component Value Date/Time   HMDIABEYEEXA No Retinopathy 11/04/2020 12:00 AM    Last diabetic Foot exam:  endocrinology   Lab Results  Component Value Date   CHOL 155 12/05/2020   HDL 41 12/05/2020   LDLCALC 94 12/05/2020   LDLDIRECT 113.0 09/09/2020   TRIG 102 12/05/2020   CHOLHDL 3.8 12/05/2020    Hepatic Function Latest Ref Rng & Units 11/19/2020 09/09/2020 01/14/2020  Total Protein 6.5 - 8.1 g/dL 7.2 6.2 6.8  Albumin 3.5 - 5.0 g/dL 3.8 3.9 3.8  AST 15 - 41 U/L '21 16 17  ' ALT 0 - 44 U/L 14 15  12  Alk Phosphatase 38 - 126 U/L 81 80 68  Total Bilirubin 0.3 - 1.2 mg/dL 0.8 0.3 0.7    Lab Results  Component Value Date/Time   TSH 2.16 09/09/2020 07:40 AM   TSH 1.73 11/27/2019 09:23 AM   FREET4 0.71 09/09/2020 07:40 AM   FREET4 1.05 11/27/2019 09:23 AM    CBC Latest Ref Rng & Units 12/04/2020 11/19/2020 07/16/2020  WBC 4.0 - 10.5 K/uL 10.2 7.5 7.6  Hemoglobin 12.0 - 15.0 g/dL 13.0 14.2 13.8  Hematocrit 36.0 - 46.0 % 41.1 43.3 42.7  Platelets 150 - 400 K/uL 214 220 199    Lab Results  Component Value  Date/Time   VD25OH 35.41 09/09/2020 07:40 AM   VD25OH 32.72 09/10/2019 07:49 AM    Clinical ASCVD: Yes  The 10-year ASCVD risk score Mikey Bussing DC Jr., et al., 2013) is: 43.7%   Values used to calculate the score:     Age: 29 years     Sex: Female     Is Non-Hispanic African American: No     Diabetic: Yes     Tobacco smoker: No     Systolic Blood Pressure: 330 mmHg     Is BP treated: Yes     HDL Cholesterol: 41 mg/dL     Total Cholesterol: 155 mg/dL    Depression screen [facility] 2/9 10/07/2020 09/09/2020 01/13/2020  Decreased Interest 0 0 0  Down, Depressed, Hopeless 0 0 0  PHQ - 2 Score 0 0 0  Altered sleeping 0 0 -  Tired, decreased energy 0 0 -  Change in appetite 0 0 -  Feeling bad or failure about yourself  0 0 -  Trouble concentrating 0 0 -  Moving slowly or fidgety/restless 0 0 -  Suicidal thoughts 0 0 -  PHQ-9 Score 0 0 -  Difficult doing work/chores Not difficult at all Not difficult at all -  Some recent data might be hidden    Social History   Tobacco Use  Smoking Status Never Smoker  Smokeless Tobacco Never Used   BP Readings from Last 3 Encounters:  12/22/20 136/70  12/05/20 130/62  12/03/20 128/70   Pulse Readings from Last 3 Encounters:  12/22/20 87  12/05/20 80  12/03/20 96   Wt Readings from Last 3 Encounters:  12/22/20 159 lb 6 oz (72.3 kg)  12/04/20 161 lb (73 kg)  12/03/20 161 lb 6.4 oz (73.2 kg)   BMI Readings from Last 3 Encounters:  12/22/20 27.79 kg/m  12/04/20 28.52 kg/m  12/03/20 28.14 kg/m    Assessment/Interventions: Review of patient past medical history, allergies, medications, health status, including review of consultants reports, laboratory and other test data, was performed as part of comprehensive evaluation and provision of chronic care management services.   SDOH:  (Social Determinants of Health) assessments and interventions performed: Yes SDOH Interventions   Flowsheet Row Most Recent Value  SDOH Interventions   Financial  Strain Interventions Intervention Not Indicated     SDOH Screenings   Alcohol Screen: Low Risk   . Last Alcohol Screening Score (AUDIT): 0  Depression (PHQ2-9): Low Risk   . PHQ-2 Score: 0  Financial Resource Strain: Low Risk   . Difficulty of Paying Living Expenses: Not very hard  Food Insecurity: No Food Insecurity  . Worried About Charity fundraiser in the Last Year: Never true  . Ran Out of Food in the Last Year: Never true  Housing: Low Risk   . Last Housing Risk  Score: 0  Physical Activity: Insufficiently Active  . Days of Exercise per Week: 7 days  . Minutes of Exercise per Session: 20 min  Social Connections: Not on file  Stress: No Stress Concern Present  . Feeling of Stress : Not at all  Tobacco Use: Low Risk   . Smoking Tobacco Use: Never Smoker  . Smokeless Tobacco Use: Never Used  Transportation Needs: No Transportation Needs  . Lack of Transportation (Medical): No  . Lack of Transportation (Non-Medical): No    CCM Care Plan  Allergies  Allergen Reactions  . Iodinated Diagnostic Agents Other (See Comments)    Itching (severe) and chest tightness  . Penicillins Anaphylaxis, Swelling, Rash and Other (See Comments)    Has patient had a PCN reaction causing immediate rash, facial/tongue/throat swelling, SOB or lightheadedness with hypotension: Yes Has patient had a PCN reaction causing severe rash involving mucus membranes or skin necrosis: Unknown Has patient had a PCN reaction that required hospitalization: Unknown Has patient had a PCN reaction occurring within the last 10 years: No If all of the above answers are "NO", then may proceed with Cephalosporin use.   . Amlodipine Swelling    Pedal edema  . Anectine [Succinylcholine] Other (See Comments)    Brother went into cardiac arrest.  . Codeine Nausea Only  . Gabapentin Other (See Comments)    Gait abnormality  . Influenza Vaccines Other (See Comments)    Muscle weakness; unable to walk  .  Nortriptyline Other (See Comments)    Eye swelling and mouth drawed up  . Pamelor [Nortriptyline Hcl] Other (See Comments)    Patient states caused her face to draw in together.  . Valsartan Other (See Comments) and Cough    Allergy to generic only, "Hacking" cough  . Zetia [Ezetimibe] Other (See Comments)    Bad muscle cramps  . Erythromycin Rash and Swelling  . Sulfa Antibiotics Rash    Medications Reviewed Today    Reviewed by [name], MD (Physician) on 12/22/20 at 1013  Med List Status: <None>  Medication Order Taking? Sig Documenting Provider Last Dose Status Informant  acetaminophen (TYLENOL) 500 MG tablet [id number] Yes Take 1 tablet (500 mg total) by mouth 3 (three) times daily as needed. [name], MD Taking Active Self  Ascorbic Acid (VITAMIN C) 100 MG tablet [id number] Yes Take 100 mg by mouth daily. [provider] Taking Active Self  BD ULTRA-FINE LANCETS lancets [id number] Yes Use as instructed upto 6 times daily Phadke, Karsten Ro, MD Taking Active Self  benzonatate (TESSALON) 100 MG capsule [id number] Yes Take 1 capsule (100 mg total) by mouth 3 (three) times daily as needed for cough. [name], MD Taking Active   Cholecalciferol (VITAMIN D3) 25 MCG (1000 UT) CAPS [id number] Yes Take 1 capsule (1,000 Units total) by mouth daily. [name], MD Taking Active   clopidogrel (PLAVIX) 75 MG tablet [id number] Yes TAKE 1 TABLET(75 MG) BY MOUTH DAILY Waynetta Sandy, MD Taking Active   Continuous Blood Gluc Transmit (DEXCOM G6 TRANSMITTER) [place] [id number] Yes 1 kit by Does not apply route See admin instructions. Use to check blood sugar Philemon Kingdom, MD Taking Active Self  diclofenac Sodium (VOLTAREN) 1 % GEL [id number] Yes Apply 2 g topically 3 (three) times daily. [name], MD Taking Active   glucagon 1 MG injection [id number] Yes Inject 1 mg into the muscle once as needed for up to 1 dose. Philemon Kingdom, MD Taking Active  Self  glucose blood test  strip [id number] Yes Use as instructed to check glucose 4 times daily. Philemon Kingdom, MD Taking Active   insulin aspart (NOVOLOG) 100 UNIT/ML injection [id number] Yes USE UP TO 50 UNITS DAILY IN THE INSULIN PUMP Philemon Kingdom, MD Taking Active            Med Note Flonnie Overman Dec 03, 2020  1:34 PM) Pt using pens temporarily until pump is restarted  insulin degludec (TRESIBA FLEXTOUCH) 100 UNIT/ML FlexTouch Pen [id number] Yes Inject 28-30 Units into the skin daily. Philemon Kingdom, MD Taking Active   Insulin Pen Needle (PEN NEEDLES) 32G X 5 MM MISC [id number] Yes Use 1x a day Philemon Kingdom, MD Taking Active   losartan (COZAAR) 25 MG tablet [id number] Yes Take 1 tablet (25 mg total) by mouth daily. [name], MD Taking Active   metoprolol succinate (TOPROL-XL) 50 MG 24 hr tablet [id number] Yes Take 1 tablet (50 mg total) by mouth at bedtime. Take with or immediately following a meal. [name], MD Taking Active   nitroGLYCERIN (NITROSTAT) 0.4 MG SL tablet [id number] Yes Place 1 tablet (0.4 mg total) under the tongue every 5 (five) minutes as needed for chest pain. [name], MD Taking Active Self  pregabalin (LYRICA) 25 MG capsule [id number] Yes Take 1 capsule (25 mg total) by mouth 2 (two) times daily. [name], MD Taking Active   rosuvastatin (CRESTOR) 20 MG tablet [id number] Yes Take 1 tablet (20 mg total) by mouth daily. [name], MD Taking Active   SYNTHROID 75 MCG tablet [id number] Yes Take 1 tablet (75 mcg total) by mouth daily before breakfast. Don't take any on Sundays [name], MD Taking Active   venlafaxine [facility]) 37.5 MG tablet [id number] Yes Take 37.5 mg once a day for a week then increase to 37.5 mg twice a day and continue that dose [provider] Taking Active           Patient Active Problem List   Diagnosis Date Noted  . Atherosclerosis of aorta ([place]) 12/16/2020  . Neck pain  12/03/2020  . Cause of injury, fall 09/01/2020  . Grade II diastolic dysfunction 99/24/2683  . Chronic venous insufficiency 04/07/2020  . Bowel habit changes 02/18/2020  . Diverticulosis   . Partial small bowel obstruction ([place]) 01/15/2020  . Epistaxis 01/13/2020  . Cough 01/05/2020  . Cognitive changes 10/22/2019  . Right sided abdominal pain 10/07/2019  . Skin lesion 09/14/2019  . Renal insufficiency 09/14/2019  . Pain in right buttock 06/18/2019  . Chronic midline thoracic back pain 05/14/2019  . Sjogren's syndrome without extraglandular involvement ([place]) 02/26/2019  . Epigastric discomfort 12/17/2018  . PAD (peripheral artery disease) ([place]) 10/04/2018  . Raynaud disease 09/04/2018  . Amaurosis fugax, both eyes 06/18/2018  . TIA (transient ischemic attack) 05/21/2018  . 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 Facial paresthesia 02/07/2018  . Pain and swelling of lower leg 12/25/2017  . Fibromyalgia   . 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 Intractable episodic headache 03/13/2017  . Numbness and tingling of both lower extremities 03/13/2017  . 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 Fatty liver 02/13/2017  . LADA (latent autoimmune diabetes in adults), managed as type 1 ([place]) 10/03/2016  . Medicare annual wellness visit, subsequent 06/19/2015  . Health maintenance examination 06/19/2015  . Advanced care planning/counseling discussion 06/19/2015  . 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 Vitamin D deficiency   . Family history of premature CAD 06/02/2015  .  Chest pain 05/26/2015  . 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 Hyperlipidemia associated with type 2 diabetes mellitus ([place]) 07/30/2014  . Essential hypertension 07/17/2014  . Hypothyroidism due to Hashimoto's thyroiditis 07/17/2014  . Adjustment disorder with mixed anxiety and depressed mood 07/17/2014  . Apnea, sleep 08/22/2012  . LBP (low back pain)  02/27/2012  . Positive ANA (antinuclear antibody) 01/06/2012    Immunization History  Administered Date(s) Administered  . Pneumococcal Conjugate-13 09/23/2014  . Pneumococcal Polysaccharide-23 01/05/2010, 07/17/2013  . Tdap 10/07/2011    Conditions to be addressed/monitored:  Hypertension and Hyperlipidemia  Care Plan : [place]  Updates made by Debbora Dus, [facility] since 02/02/2021 12:00 AM    Problem: CHL AMB "PATIENT-SPECIFIC PROBLEM"     Long-Range Goal: [place]   Start Date: 01/19/2021  Priority: High  Note:    Current Barriers:  . None identified  Pharmacist Clinical Goal(s):  Marland Kitchen Patient will contact provider office for questions/concerns as evidenced notation of same in electronic health record through collaboration with PharmD and provider.   Interventions: . 1:1 collaboration with [name], MD regarding development and update of comprehensive plan of care as evidenced by provider attestation and co-signature . Inter-disciplinary care team collaboration (see longitudinal plan of care) . Comprehensive medication review performed; medication list updated in electronic medical record  Hypertension (BP goal <140/90) -Controlled - per clinic readings within goal  -Current treatment:  Metoprolol succinate 50 mg - 1 daily at bedtime   Losartan 25 mg - 1 daily -Medications previously tried: none  -Current home readings: none reported, usually well controlled at home -Current dietary habits: patient reports heart healthy diet -Denies hypotensive/hypertensive symptoms -Educated on BP goals and benefits of medications for prevention of heart attack, stroke and kidney damage; -Counseled to monitor BP at home weekly, document, and provide log at future appointments -Recommended to continue current medication  Hyperlipidemia: (LDL goal < 70) -Not ideally controlled LDL 94 (improving) -Current treatment:  Rosuvastatin 20 mg - 1  daily -Medications previously tried: Zetia  -Recent mix up at pharmacy where patient was taking lower dose rosuvastatin 10 mg daily again instead of 20 mg. This has been resolved. Expect LDL to be lower at next check.  -Educated on Cholesterol goals;  -Recommended to continue current medication  Patient Goals/Self-Care Activities . Patient will:  - take medications as prescribed check blood pressure weekly, document, and provide at future appointments  Follow Up Plan: Telephone follow up appointment with care management team member scheduled for: 3 months  Medication Assistance: None required.  Patient affirms current coverage meets needs.      Patient's preferred pharmacy is:  [facility] - [place], [place] - [address] [place] [address] Phone: [phone] Fax: [phone]  [id number] - [place], [place] - [place] [address] [place] [address] Phone: [phone] Fax: [phone]  Uses pill box? Yes Pt endorses 100% compliance  Patient wants to switch from adherence packaging to vials to resume using her pillbox. She states she can keep up with it better this way. She can see the names of the medications more easily.  She has not had any issues with [facility] thus far.  Care Plan and Follow Up Patient Decision:  Patient agrees to Care Plan and Follow-up.  Debbora Dus, PharmD Clinical Pharmacist [place] at [facility]  [place] [phone]

## 2021-01-20 ENCOUNTER — Telehealth: Payer: Self-pay

## 2021-01-20 NOTE — Chronic Care Management (AMB) (Addendum)
Chronic Care Management Pharmacy Assistant   Name: Cassandra Benson  MRN: [id number] DOB: 1941/06/14  Reason for Encounter: Medication Review- Medication Adherence and Delivery Coordination  Recent office visits:  None since last CCM visit  Recent consult visits:  None since last CCM visit  Hospital visits:  12/04/20 ED visit for chest pain. No admission.  11/19/20 ED visit for headache. No admission.  Medications: Outpatient Encounter Medications as of 01/20/2021  Medication Sig   acetaminophen (TYLENOL) 500 MG tablet Take 1 tablet (500 mg total) by mouth 3 (three) times daily as needed.   Ascorbic Acid (VITAMIN C) 100 MG tablet Take 100 mg by mouth daily.   BD ULTRA-FINE LANCETS lancets Use as instructed upto 6 times daily   benzonatate (TESSALON) 100 MG capsule Take 1 capsule (100 mg total) by mouth 3 (three) times daily as needed for cough.   Cholecalciferol (VITAMIN D3) 25 MCG (1000 UT) CAPS Take 1 capsule (1,000 Units total) by mouth daily.   clopidogrel (PLAVIX) 75 MG tablet TAKE 1 TABLET(75 MG) BY MOUTH DAILY   Continuous Blood Gluc Transmit (DEXCOM G6 TRANSMITTER) MISC 1 kit by Does not apply route See admin instructions. Use to check blood sugar   diclofenac Sodium (VOLTAREN) 1 % GEL Apply 2 g topically 3 (three) times daily.   glucagon 1 MG injection Inject 1 mg into the muscle once as needed for up to 1 dose.   glucose blood test strip Use as instructed to check glucose 4 times daily.   insulin aspart (NOVOLOG) 100 UNIT/ML injection USE UP TO 50 UNITS DAILY IN THE INSULIN PUMP   insulin degludec (TRESIBA FLEXTOUCH) 100 UNIT/ML FlexTouch Pen Inject 28-30 Units into the skin daily.   Insulin Pen Needle (PEN NEEDLES) 32G X 5 MM MISC Use 1x a day   losartan (COZAAR) 25 MG tablet Take 1 tablet (25 mg total) by mouth daily.   metoprolol succinate (TOPROL-XL) 50 MG 24 hr tablet Take 1 tablet (50 mg total) by mouth at bedtime. Take with or immediately following a meal.    nitroGLYCERIN (NITROSTAT) 0.4 MG SL tablet Place 1 tablet (0.4 mg total) under the tongue every 5 (five) minutes as needed for chest pain.   pregabalin (LYRICA) 25 MG capsule Take 1 capsule (25 mg total) by mouth 2 (two) times daily.   rosuvastatin (CRESTOR) 20 MG tablet Take 1 tablet (20 mg total) by mouth daily.   SYNTHROID 75 MCG tablet Take 1 tablet (75 mcg total) by mouth daily before breakfast. Don't take any on Sundays   venlafaxine (EFFEXOR) 37.5 MG tablet Take 37.5 mg once a day for a week then increase to 37.5 mg twice a day and continue that dose   No facility-administered encounter medications on file as of 01/20/2021.   BP Readings from Last 3 Encounters:  12/22/20 136/70  12/05/20 130/62  12/03/20 128/70    Lab Results  Component Value Date   HGBA1C 9.7 (A) 12/03/2020    No OVs, Consults, or hospital visits since last care coordination call / Pharmacist visit. No medication changes indicated  Patient obtains medications through Adherence Packaging  30 Days   Last adherence delivery date: 12/29/20  Patient is due for next adherence delivery on: 01/29/21  Spoke with patient on 01/21/21 and reviewed medications and coordinated delivery.  This delivery to include: Vials  30 Days (prefers to switch to vials) Crestor 20 mg 1 tablet daily (evening meal) Plavix 75 mg 1 tablet daily (evening meal)  Losartan 25 mg 1 tablet daily (breakfast) Metoprolol ER 50 mg 1 tablet daily (bedtime) Synthroid 75 mcg 1 tablet daily Monday-Saturday except Thursdays (before breakfast ) Venlafaxine 37.5 mg 1 tablet twice daily (breakfast and evening meal)   Patient declined the following medications this month: Benzonatate 100 mg- completed course Clindamycin topical solution- completed course Diclofenac gel 1% Glucagon Emergency Kit 1 mg- PRN use Novolog Flexpen inject up to 50 units daily in insulin pump (states she uses 30-35 units daily) - states she has enough for 1 month. Pen Needles use  4-5 times a day Pregabalin 25 mg 1 tablet daily (breakfast)- patient holding because she thinks it is causing weight gain One Touch Test Strips Use to check BG up to 4 times daily (not checking as often as she should) [name] Flextouch inject 40 units into skin daily ( states it was increased from 26) (states she has enough to last her 1 month) Zetia 10 mg  Patient does not need refills on any medications.  Confirmed delivery date of 01/29/21, advised patient that pharmacy will contact her the morning of delivery.  Recent blood pressure readings are as follows: No available readings. Recent blood glucose readings are as follows: Not checking as often or documenting  Follow-Up:  Pharmacist Review  Debbora Dus, CPP notified  Margaretmary Dys, [place] Assistant [phone]  I have reviewed the care management and care coordination activities outlined in this encounter and I am certifying that I agree with the content of this note. No further action required.  Debbora Dus, PharmD Clinical Pharmacist [facility] at [facility] [phone]

## 2021-01-21 ENCOUNTER — Encounter: Payer: Self-pay | Admitting: Family Medicine

## 2021-01-25 ENCOUNTER — Ambulatory Visit: Payer: HMO | Admitting: Family Medicine

## 2021-02-01 ENCOUNTER — Ambulatory Visit: Payer: HMO | Admitting: Cardiovascular Disease

## 2021-02-02 NOTE — Progress Notes (Signed)
Encounter details: CCM Time Spent       Value Time User   Time spent with patient (minutes)  50 02/02/2021  3:51 PM Debbora Dus, [facility]   Time spent performing Chart review  30 02/02/2021  3:51 PM Debbora Dus, [facility]   Total time (minutes)  80 02/02/2021  3:51 PM Debbora Dus, RPH      Moderate to High Complex Decision Making       Value Time User   Moderate to High complex decision making  Yes 02/02/2021  3:51 PM Debbora Dus, [facility]      CCM Services: This encounter meets complex CCM services and moderate to high decision making.  Prior to outreach and patient consent for Chronic Care Management, I referred this patient for services after reviewing the nominated patient list or from a personal encounter with the patient.  I have personally reviewed this encounter including the documentation in this note and have collaborated with the care management provider regarding care management and care coordination activities to include development and update of the comprehensive care plan. I am certifying that I agree with the content of this note and encounter as supervising physician.

## 2021-02-02 NOTE — Progress Notes (Signed)
Cardiology Office Note  Date:  02/05/2021   ID:  Cassandra Benson, DOB 12-Jan-1941, MRN [id number]  PCP:  [name], MD   Chief Complaint  Patient presents with  . Other    6 month follow up. Patient states she was in the ED for chest pain recently. Meds reviewed verbally with patient.     HPI:  Cassandra Benson is a very pleasant 80 year old woman with  15 years of diabetes, on insulin,  Hyperlipidemia, family history of coronary artery disease,  prior episodes of chest pain,  80% stenosis of the left internal carotid artery.  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  States symptoms became worse when she was folding her laundry.   --CTA chest shows no PE or other acute abnormality.  Episode of left arm pain and  chest pain left week, went away several hours later  Last stress test 03/2019  CT angio reviewed, images pulled up Minimal aortic athero, no significant coronary Ca  Lab work reviewed A1C 9.7, down from 12 Total chol 155, LDL 94  Last stress test July 2020 Low risk study, good perfusion  EKG personally reviewed by myself on todays visit Shows normal sinus rhythm rate 95 bpm no significant ST or T wave changes  Other past medical history reviewed  stressful Dec 03, 2015 husband died in 2015-12-03, cancer Was diagnosed late spring, died several months later from metastatic disease lonely at nighttime  Previous episodes of chest pain,  woke up with some chest discomfort.   Went to the emergency room, cardiac enzymes negative 3,  chest CT with no PE.   She does report prior cardiac catheterization in 12/02/98 showing no significant disease CT scan done 04/2015 in the emergency room shows no coronary calcifications, minimal minimal descending aorta atherosclerosis   PMH:   has a past medical history of Allergy, ANA positive, Arthritis, Carotid stenosis, asymptomatic (06/19/2015), Colon polyps, Dermatomyositis ([place]), Diabetes mellitus without complication ([place]), Diverticulosis, Family history of adverse reaction to anesthesia, Fibromyalgia, GERD (gastroesophageal reflux disease), Glaucoma, History of blood clots, History of chicken pox, History of diverticulitis, History of pericarditis 12-02-1984), History of pneumonia 12-02-2012), History of shingles, History of UTI, Hyperlipidemia, Hypertension, Hypothyroidism, Mixed connective tissue disease ([place]), Partial small bowel obstruction ([place]) (12/2019), Peptic ulcer, Pneumonia, PONV (postoperative nausea and vomiting), Raynaud's disease without gangrene, Shoulder pain (left), Sjogren's syndrome ([place]), Sleep apnea, 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  PSH:    Past Surgical History:  Procedure Laterality Date  . ABDOMINAL HYSTERECTOMY  1978   fibroids and menorrhagia, ovaries remain  . ARTERY BIOPSY Right 04/06/2018   Procedure: BIOPSY TEMPORAL ARTERY RIGHT;  Surgeon: Beverly Gust, MD;  Location: [place];  Service: ENT;  Laterality: Right;  Diabetic - insulin pump sleep apnea  . [facility] normal per patient  . COLONOSCOPY  12-03-2011   1 TA, 1 HP, very tortuous colon ([place])  . COLONOSCOPY  02/2020   TA, inflammatory polyp, mod diverticulosis, int/ext hemorrhoids ([place]) no rpt recommended   .  COLONOSCOPY WITH ESOPHAGOGASTRODUODENOSCOPY (EGD)  03/2007   2 ulcers, benign polyp, rpt 5 yrs ([place], [place])  . JOINT REPLACEMENT Right    hip  . PARTIAL HIP  ARTHROPLASTY  2013   Right hip replacement  . TONSILLECTOMY    . TONSILLECTOMY AND ADENOIDECTOMY    . TRANSCAROTID ARTERY REVASCULARIZATION Left 07/23/2018   Procedure: TRANSCAROTID ARTERY REVASCULARIZATION;  Surgeon: Marty Heck, MD;  Location: [facility];  Service: Vascular;  Laterality: Left;  . TUBAL LIGATION    . VAGINAL DELIVERY     x2, no complications    Current Outpatient Medications  Medication Sig Dispense Refill  . acetaminophen (TYLENOL) 500 MG tablet Take 1 tablet (500 mg total) by mouth 3 (three) times daily as needed.    . Ascorbic Acid (VITAMIN C) 100 MG tablet Take 100 mg by mouth daily.    . BD ULTRA-FINE LANCETS lancets Use as instructed upto 6 times daily 600 each 2  . benzonatate (TESSALON) 100 MG capsule Take 1 capsule (100 mg total) by mouth 3 (three) times daily as needed for cough. 30 capsule 3  . Cholecalciferol (VITAMIN D3) 25 MCG (1000 UT) CAPS Take 1 capsule (1,000 Units total) by mouth daily. 30 capsule   . clopidogrel (PLAVIX) 75 MG tablet TAKE 1 TABLET(75 MG) BY MOUTH DAILY 30 tablet 6  . Continuous Blood Gluc Transmit (DEXCOM G6 TRANSMITTER) MISC 1 kit by Does not apply route See admin instructions. Use to check blood sugar 1 each 1  . diclofenac Sodium (VOLTAREN) 1 % GEL Apply 2 g topically 3 (three) times daily. 100 g 3  . glucagon 1 MG injection Inject 1 mg into the muscle once as needed for up to 1 dose. 1 each 12  . glucose blood test strip Use as instructed to check glucose 4 times daily. 400 each 3  . insulin aspart (NOVOLOG) 100 UNIT/ML injection USE UP TO 50 UNITS DAILY IN THE INSULIN PUMP 50 mL 0  . insulin degludec (TRESIBA FLEXTOUCH) 100 UNIT/ML FlexTouch Pen Inject 28-30 Units into the skin daily. 18 mL 1  . Insulin Pen Needle (PEN NEEDLES) 32G X 5 MM MISC Use 1x a day 100 each 3  . losartan (COZAAR) 25 MG tablet Take 1 tablet (25 mg total) by mouth daily. 90 tablet 3  . metoprolol succinate (TOPROL-XL) 50 MG 24 hr tablet Take 1 tablet  (50 mg total) by mouth at bedtime. Take with or immediately following a meal. 90 tablet 1  . nitroGLYCERIN (NITROSTAT) 0.4 MG SL tablet Place 1 tablet (0.4 mg total) under the tongue every 5 (five) minutes as needed for chest pain. 30 tablet 1  . pregabalin (LYRICA) 25 MG capsule Take 1 capsule (25 mg total) by mouth 2 (two) times daily.    . rosuvastatin (CRESTOR) 20 MG tablet Take 1 tablet (20 mg total) by mouth daily. 90 tablet 3  . SYNTHROID 75 MCG tablet Take 1 tablet (75 mcg total) by mouth daily before breakfast. Don't take any on Sundays 80 tablet 1  . venlafaxine (EFFEXOR) 37.5 MG tablet Take 37.5 mg once a day for a week then increase to 37.5 mg twice a day and continue that dose     No current facility-administered medications for this visit.    Allergies:   Iodinated diagnostic agents, Penicillins, Amlodipine, Anectine [succinylcholine], Codeine, Gabapentin, Influenza vaccines, Nortriptyline, Pamelor [nortriptyline hcl], Valsartan, Zetia [ezetimibe], Erythromycin, and Sulfa antibiotics   Social History:  The patient  reports that she  has never smoked. She has never used smokeless tobacco. She reports that she does not drink alcohol and does not use drugs.   Family History:   family history includes Alcohol abuse in her brother; Anuerysm in her sister; Breast cancer in her maternal aunt, maternal aunt, and sister; CAD in her sister; CAD (age of onset: 87) in her brother; CAD (age of onset: 64) in her brother; CAD (age of onset: 62) in her father and mother; COPD in her brother and mother; Depression in her grandchild; Diabetes in her brother, sister, and sister; Gallbladder disease in her maternal grandmother; [name] in her mother; Hypertension in her maternal grandmother; Lupus in her mother and sister; Rheum arthritis in her mother; Seizures in her son; Stroke in her brother, maternal grandmother, and sister.    Review of Systems: Review of Systems  Constitutional: Positive  for malaise/fatigue.  HENT: Negative.   Respiratory: Negative.   Cardiovascular: Negative.   Gastrointestinal: Negative.   Musculoskeletal: Negative.   Neurological: Positive for headaches.  Psychiatric/Behavioral: Negative.   All other systems reviewed and are negative.   PHYSICAL EXAM: VS:  BP 110/60 (BP Location: Left Arm, Patient Position: Sitting, Cuff Size: Normal)   Pulse 95   Ht '5\' 3"'  (1.6 m)   Wt 161 lb (73 kg)   SpO2 96%   BMI 28.52 kg/m  , BMI Body mass index is 28.52 kg/m. Constitutional:  oriented to person, place, and time. No distress.  HENT:  Head: Grossly normal Eyes:  no discharge. No scleral icterus.  Neck: No JVD, no carotid bruits  Cardiovascular: Regular rate and rhythm, no murmurs appreciated Pulmonary/Chest: Clear to auscultation bilaterally, no wheezes or rails Abdominal: Soft.  no distension.  no tenderness.  Musculoskeletal: Normal range of motion Neurological:  normal muscle tone. Coordination normal. No atrophy Skin: Skin warm and dry Psychiatric: normal affect, pleasant   Recent Labs: 09/09/2020: TSH 2.16 11/19/2020: ALT 14 12/04/2020: BUN 11; Creatinine, Ser 0.86; Hemoglobin 13.0; Platelets 214; Potassium 4.1; Sodium 137 12/05/2020: B Natriuretic Peptide 51.5    Lipid Panel Lab Results  Component Value Date   CHOL 155 12/05/2020   HDL 41 12/05/2020   LDLCALC 94 12/05/2020   TRIG 102 12/05/2020      Wt Readings from Last 3 Encounters:  02/05/21 161 lb (73 kg)  12/22/20 159 lb 6 oz (72.3 kg)  12/04/20 161 lb (73 kg)     ASSESSMENT AND PLAN:  Essential hypertension -  Blood pressure is well controlled on today's visit. No changes made to the medications.  LADA (latent autoimmune diabetes in adults), managed as type 1 ([place]) Working with endocrine A1C elevated, reports that she eats healthy Walks daily  bilateral carotid artery stenosis Tolerating Crestor 10 mg daily, zetia 10 daily stable  Adjustment disorder with mixed  anxiety and depressed mood Walks with sister, walking  Chest pain Typical and atypical features Prior stress test no ischemia Walking 30 min, no angina Discussed repeat stress test for recurrent symptoms especially with her exercise program Could take NTG  Pure hypercholesterolemia Cholesterol is at goal on the current lipid regimen. No changes to the medications were made.  Chronic headaches MRI findings discussed Findings of chronic ischemic microangiopathy and generalized volume loss without a clear lobar predilection.    Total encounter time more than 35 minutes  Greater than 50% was spent in counseling and coordination of care with the patient    No orders of the defined types were placed in this  encounter.    Signed, Esmond Plants, M.D., Ph.D. 02/05/2021  [place], [place]

## 2021-02-02 NOTE — Patient Instructions (Signed)
Dear Cassandra Benson,  Below is a summary of the goals we discussed during our follow up appointment on January 19, 2021. Please contact me anytime with questions or concerns.   Visit Information  Patient Care Plan: [facility] Pharmacy Care Plan    Problem Identified: [facility] AMB "PATIENT-SPECIFIC PROBLEM"     Long-Range Goal: [place]   Start Date: 01/19/2021  Priority: High  Note:    Current Barriers:  . None identified  Pharmacist Clinical Goal(s):  Marland Kitchen Patient will contact provider office for questions/concerns as evidenced notation of same in electronic health record through collaboration with PharmD and provider.   Interventions: . 1:1 collaboration with [name], MD regarding development and update of comprehensive plan of care as evidenced by provider attestation and co-signature . Inter-disciplinary care team collaboration (see longitudinal plan of care) . Comprehensive medication review performed; medication list updated in electronic medical record  Hypertension (BP goal <140/90) -Controlled - per clinic readings within goal  -Current treatment:  Metoprolol succinate 50 mg - 1 daily at bedtime   Losartan 25 mg - 1 daily -Medications previously tried: none  -Current home readings: none reported, usually well controlled at home -Current dietary habits: patient reports heart healthy diet -Denies hypotensive/hypertensive symptoms -Educated on BP goals and benefits of medications for prevention of heart attack, stroke and kidney damage; -Counseled to monitor BP at home weekly, document, and provide log at future appointments -Recommended to continue current medication  Hyperlipidemia: (LDL goal < 70) -Not ideally controlled LDL 94 (improving) -Current treatment:  Rosuvastatin 20 mg - 1 daily -Medications previously tried: Zetia  -Recent mix up at pharmacy where patient was taking lower dose rosuvastatin 10 mg daily again instead of 20 mg. This has been  resolved. Expect LDL to be lower at next check.  -Educated on Cholesterol goals;  -Recommended to continue current medication  Patient Goals/Self-Care Activities . Patient will:  - take medications as prescribed check blood pressure weekly, document, and provide at future appointments  Follow Up Plan: Telephone follow up appointment with care management team member scheduled for: 3 months  Medication Assistance: None required.  Patient affirms current coverage meets needs.       The patient verbalized understanding of instructions, educational materials, and care plan provided today and agreed to receive a mailed copy of patient instructions, educational materials, and care plan.  The pharmacy team will reach out to the patient again over the next 90 days.   Debbora Dus, PharmD Clinical Pharmacist [facility] at [facility] [phone]   Basics of Medicine Management Taking your medicines correctly is an important part of managing or preventing medical problems. Make sure you know what disease or condition your medicine is treating, and how and when to take it. If you do not take your medicine correctly, it may not work well and may cause unpleasant side effects, including serious health problems. What should I do when I am taking medicines?  Read all the labels and inserts that come with your medicines. Review the information often.  Talk with your pharmacist if you get a refill and notice a change in the size, color, or shape of your medicines.  Know the potential side effects for each medicine that you take.  Try to get all your medicines from the same pharmacy. The pharmacist will have all your information and will understand how your medicines will affect each other (interact).  Tell your health care provider about all your medicines, including  over-the-counter medicines, vitamins, and herbal or dietary supplements. He or she will make sure that nothing will  interact with any of your prescribed medicines.   How can I take my medicines safely?  Take medicines only as told by your health care provider. ? Do not take more of your medicine than instructed. ? Do not take anyone else's medicines. ? Do not share your medicines with others. ? Do not stop taking your medicines unless your health care provider tells you to do so. ? You may need to avoid alcohol or certain foods or liquids when taking certain medicines. Follow your health care provider's instructions.  Do not split, mash, or chew your medicines unless your health care provider tells you to do so. Tell your health care provider if you have trouble swallowing your medicines.  For liquid medicine, use the dosing container that was provided. How should I organize my medicines? Know your medicines  Know what each of your medicines looks like. This includes size, color, and shape. Tell your health care provider if you are having trouble recognizing all the medicines that you are taking.  If you cannot tell your medicines apart because they look similar, keep them in original bottles.  If you cannot read the labels on the bottles, tell your pharmacist to put your medicines in containers with large print.  Review your medicines and your schedule with family members, a friend, or a caregiver. Use a pill organizer  Use a tool to organize your medicine schedule. Tools include a weekly pillbox, a written chart, a notebook, or a calendar.  Your tool should help you remember the following things about each medicine: ? The name of the medicine. ? The amount (dose) to take. ? The schedule. This is the day and time the medicine should be taken. ? The appearance. This includes color, shape, size, and stamp. ? How to take your medicines. This includes instructions to take them with food, without food, with fluids, or with other medicines.  Create reminders for taking your medicines. Use sticky  notes, or alarms on your watch, mobile device, or phone calendar.  You may choose to use a more advanced management system. These systems have storage, alarms, and visual and audio prompts.  Some medicines can be taken on an "as-needed" basis. These include medicines for nausea or pain. If you take an as-needed medicine, write down the name and dose, as well as the date and time that you took it.   How should I plan for travel?  Take your pillbox, medicines, and organization system with you when traveling.  Have your medicines refilled before you travel. This will ensure that you do not run out of your medicines while you are away from home.  Always carry an updated list of your medicines with you. If there is an emergency, a first responder can quickly see what medicines you are taking.  Do not pack your medicines in checked luggage in case your luggage is lost or delayed.  If any of your medicines is considered a controlled substance, make sure you bring a letter from your health care provider with you. How should I store and discard my medicines? For safe storage:  Store medicines in a cool, dry area away from light, or as directed by your health care provider. Do not store medicines in the bathroom. Heat and humidity will affect them.  Do not store your medicines with other chemicals, or with medicines for pets or other household  members.  Keep medicines away from children and pets. Do not leave them on counters or bedside tables. Store them in high cabinets or on high shelves. For safe disposal:  Check expiration dates regularly. Do not take expired medicines. Discard medicines that are older than the expiration date.  Learn a safe way to dispose of your medicines. You may: ? Use a local government, hospital, or pharmacy medicine-take-back program. ? Mix the medicines with inedible substances, put them in a sealed bag or empty container, and throw them in the trash. What should I  remember?  Tell your health care provider if you: ? Experience side effects. ? Have new symptoms. ? Have other concerns about taking your medicines.  Review your medicines regularly with your health care provider. Other medicines, diet, medical conditions, weight changes, and daily habits can all affect how medicines work. Ask if you need to continue taking each medicine, and discuss how well each one is working.  Refill your medicines early to avoid running out of them.  In case of an accidental overdose, call your local [place] at [phone] or visit your local emergency department immediately. This is important. Summary  Taking your medicines correctly is an important part of managing or preventing medical problems.  You need to make sure that you understand what you are taking a medicine for, as well as how and when you need to take it.  Know your medicines and use a pill organizer to help you take your medicines correctly.  In case of an accidental overdose, call your local [place] at [phone] or visit your local emergency department immediately. This is important. This information is not intended to replace advice given to you by your health care provider. Make sure you discuss any questions you have with your health care provider. Document Revised: 09/07/2017 Document Reviewed: 09/07/2017 Elsevier Patient Education  2021 [employer].

## 2021-02-05 ENCOUNTER — Other Ambulatory Visit: Payer: Self-pay

## 2021-02-05 ENCOUNTER — Encounter: Payer: Self-pay | Admitting: Cardiovascular Disease

## 2021-02-05 ENCOUNTER — Ambulatory Visit: Payer: HMO | Admitting: Cardiovascular Disease

## 2021-02-05 VITALS — BP 110/60 | HR 95 | Ht 63.0 in | Wt 161.0 lb

## 2021-02-05 DIAGNOSIS — I739 Peripheral vascular disease, unspecified: Secondary | ICD-10-CM | POA: Diagnosis not present

## 2021-02-05 DIAGNOSIS — Z794 Long term (current) use of insulin: Secondary | ICD-10-CM | POA: Diagnosis not present

## 2021-02-05 DIAGNOSIS — I6523 Occlusion and stenosis of bilateral carotid arteries: Secondary | ICD-10-CM | POA: Diagnosis not present

## 2021-02-05 DIAGNOSIS — E1169 Type 2 diabetes mellitus with other specified complication: Secondary | ICD-10-CM

## 2021-02-05 DIAGNOSIS — I10 Essential (primary) hypertension: Secondary | ICD-10-CM

## 2021-02-05 DIAGNOSIS — E782 Mixed hyperlipidemia: Secondary | ICD-10-CM

## 2021-02-05 DIAGNOSIS — R079 Chest pain, unspecified: Secondary | ICD-10-CM | POA: Diagnosis not present

## 2021-02-05 MED ORDER — NITROGLYCERIN 0.4 MG SL SUBL: 0.4000 mg | SUBLINGUAL_TABLET | SUBLINGUAL | 1 refills | Status: DC | PRN

## 2021-02-05 NOTE — Patient Instructions (Signed)
Medication Instructions:  No changes  If you need a refill on your cardiac medications before your next appointment, please call your pharmacy.    Lab work: No new labs needed   If you have labs (blood work) drawn today and your tests are completely normal, you will receive your results only by: . MyChart Message (if you have MyChart) OR . A paper copy in the mail If you have any lab test that is abnormal or we need to change your treatment, we will call you to review the results.   Testing/Procedures: No new testing needed   Follow-Up: At [facility], you and your health needs are our priority.  As part of our continuing mission to provide you with exceptional heart care, we have created designated Provider Care Teams.  These Care Teams include your primary Cardiologist (physician) and Advanced Practice Providers (APPs -  Physician Assistants and Nurse Practitioners) who all work together to provide you with the care you need, when you need it.  . You will need a follow up appointment in 12 months  . Providers on your designated Care Team:   . Christopher Berge, NP . Ryan Dunn, PA-C . Jacquelyn Visser, PA-C  Any Other Special Instructions Will Be Listed Below (If Applicable).  COVID-19 Vaccine Information can be found at: [email or web address] For questions related to vaccine distribution or appointments, please email [email or web address] or call [phone].     

## 2021-02-12 ENCOUNTER — Encounter: Payer: Self-pay | Admitting: Family Medicine

## 2021-02-12 ENCOUNTER — Other Ambulatory Visit: Payer: Self-pay

## 2021-02-12 ENCOUNTER — Ambulatory Visit (INDEPENDENT_AMBULATORY_CARE_PROVIDER_SITE_OTHER): Payer: HMO | Admitting: Family Medicine

## 2021-02-12 VITALS — BP 140/68 | HR 97 | Temp 97.9°F | Ht 63.0 in | Wt 162.1 lb

## 2021-02-12 DIAGNOSIS — R519 Headache, unspecified: Secondary | ICD-10-CM | POA: Diagnosis not present

## 2021-02-12 DIAGNOSIS — M797 Fibromyalgia: Secondary | ICD-10-CM | POA: Diagnosis not present

## 2021-02-12 DIAGNOSIS — I10 Essential (primary) hypertension: Secondary | ICD-10-CM

## 2021-02-12 DIAGNOSIS — I7090 Unspecified atherosclerosis: Secondary | ICD-10-CM | POA: Diagnosis not present

## 2021-02-12 DIAGNOSIS — R2681 Unsteadiness on feet: Secondary | ICD-10-CM | POA: Diagnosis not present

## 2021-02-12 DIAGNOSIS — N644 Mastodynia: Secondary | ICD-10-CM | POA: Diagnosis not present

## 2021-02-12 DIAGNOSIS — R202 Paresthesia of skin: Secondary | ICD-10-CM

## 2021-02-12 DIAGNOSIS — I739 Peripheral vascular disease, unspecified: Secondary | ICD-10-CM | POA: Diagnosis not present

## 2021-02-12 DIAGNOSIS — I6522 Occlusion and stenosis of left carotid artery: Secondary | ICD-10-CM | POA: Diagnosis not present

## 2021-02-12 MED ORDER — TRAMADOL HCL 50 MG PO TABS: 25.0000 mg | ORAL_TABLET | Freq: Two times a day (BID) | ORAL | 0 refills | Status: AC | PRN

## 2021-02-12 NOTE — Progress Notes (Signed)
Patient ID: Cassandra Benson, female    DOB: [birth year], 80 y.o.   MRN: [id number]  This visit was conducted in person.  BP 140/68   Pulse 97   Temp 97.9 F (36.6 C) (Temporal)   Ht _0  (1.6 m)   Wt 162 lb 2 oz (73.5 kg)   SpO2 96%   BMI 28.72 kg/m    CC: headache, discuss lyrica Subjective:   HPI: Cassandra Benson is a 80 y.o. female presenting on 02/12/2021 for Headache (C/o head pain all day.  Has feeling of falling forward when going to standing position. ) and Discuss Medication (Wants to discuss Lyrica. )   Predominant concern is ongoing dizziness/unsteadiness, most noticeable when she first stands up out of a chair she falls forward. Fortunately hasn't fallen as she's been able to catch herself each time She is considering starting to use an old cane. Notes decreased stamina. Considering selling condo and move onto son Cassandra Benson's property.   R temporal pain has returned - feels hot stabbing pain to that area several times a week, can last 20-30 minutes. Also notes pain to crown of head. Tylenol can help. R vision can be affected.   Note some tenderness to R upper outer breast over last 2-3 months. 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 Started on [employer] monthly injection. Was to continue effexor 37.49m bid, lyrica 259mbid (she's taking once daily) and discuss rpt carotid with [facility]. F/u appt planned June. She is interested in stopping lyrica. She has previously tolerated tramadol 1/2 tab.   Saw cardiology Dr GoRockey Situast week, note reviewed - stable period.   MRI HEAD WITHOUT CONTRAST IMPRESSION: 1. No acute intracranial abnormality. 2. 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 Electronically Signed   By: KeUlyses Jarred.D.   On: 01/08/2021 02:48     Relevant past medical, surgical, family and  social history reviewed and updated as indicated. Interim medical history since our last visit reviewed. Allergies and medications reviewed and updated. Outpatient Medications Prior to Visit  Medication Sig Dispense Refill  . acetaminophen (TYLENOL) 500 MG tablet Take 1 tablet (500 mg total) by mouth 3 (three) times daily as needed.    . Ascorbic Acid (VITAMIN C) 100 MG tablet Take 100 mg by mouth daily.    . BD ULTRA-FINE LANCETS lancets Use as instructed upto 6 times daily 600 each 2  . benzonatate (TESSALON) 100 MG capsule Take 1 capsule (100 mg total) by mouth 3 (three) times daily as needed for cough. 30 capsule 3  . Cholecalciferol (VITAMIN D3) 25 MCG (1000 UT) CAPS Take 1 capsule (1,000 Units total) by mouth daily. 30 capsule   . clopidogrel (PLAVIX) 75 MG tablet TAKE 1 TABLET(75 MG) BY MOUTH DAILY 30 tablet 6  . Continuous Blood Gluc Transmit (DEXCOM G6 TRANSMITTER) MISC 1 kit by Does not apply route See admin instructions. Use to check blood sugar 1 each 1  . diclofenac Sodium (VOLTAREN) 1 % GEL Apply 2 g topically 3 (three) times daily. 100 g 3  . glucagon 1 MG injection Inject 1 mg into the muscle once as needed for up to 1 dose. 1 each 12  . glucose blood test strip Use as instructed to check glucose 4 times daily. 400 each 3  . insulin aspart (NOVOLOG) 100 UNIT/ML injection USE  UP TO 50 UNITS DAILY IN THE INSULIN PUMP 50 mL 0  . insulin degludec (TRESIBA FLEXTOUCH) 100 UNIT/ML FlexTouch Pen Inject 28-30 Units into the skin daily. 18 mL 1  . Insulin Pen Needle (PEN NEEDLES) 32G X 5 MM MISC Use 1x a day 100 each 3  . losartan (COZAAR) 25 MG tablet Take 1 tablet (25 mg total) by mouth daily. 90 tablet 3  . metoprolol succinate (TOPROL-XL) 50 MG 24 hr tablet Take 1 tablet (50 mg total) by mouth at bedtime. Take with or immediately following a meal. 90 tablet 1  . nitroGLYCERIN (NITROSTAT) 0.4 MG SL tablet Place 1 tablet (0.4 mg total) under the tongue every 5 (five) minutes as needed for  chest pain. 25 tablet 1  . rosuvastatin (CRESTOR) 20 MG tablet Take 1 tablet (20 mg total) by mouth daily. 90 tablet 3  . SYNTHROID 75 MCG tablet Take 1 tablet (75 mcg total) by mouth daily before breakfast. Don't take any on Sundays 80 tablet 1  . venlafaxine (EFFEXOR) 37.5 MG tablet Take 37.5 mg once a day for a week then increase to 37.5 mg twice a day and continue that dose    . pregabalin (LYRICA) 25 MG capsule Take 1 capsule (25 mg total) by mouth 2 (two) times daily.     No facility-administered medications prior to visit.     Per HPI unless specifically indicated in ROS section below Review of Systems Objective:  BP 140/68   Pulse 97   Temp 97.9 F (36.6 C) (Temporal)   Ht $R'5\' 3"'wC$  (1.6 m)   Wt 162 lb 2 oz (73.5 kg)   SpO2 96%   BMI 28.72 kg/m   Wt Readings from Last 3 Encounters:  02/12/21 162 lb 2 oz (73.5 kg)  02/05/21 161 lb (73 kg)  12/22/20 159 lb 6 oz (72.3 kg)      Physical Exam Vitals and nursing note reviewed.  Constitutional:      Appearance: Normal appearance. She is obese. She is not ill-appearing.  HENT:     Head: Normocephalic and atraumatic.  Eyes:     Extraocular Movements: Extraocular movements intact.     Pupils: Pupils are equal, round, and reactive to light.  Cardiovascular:     Rate and Rhythm: Normal rate and regular rhythm.     Pulses: Normal pulses.     Heart sounds: Normal heart sounds. No murmur heard.   Pulmonary:     Effort: Pulmonary effort is normal. No respiratory distress.     Breath sounds: Normal breath sounds. No wheezing, rhonchi or rales.  Chest:       Comments: No palpable mass appreciated at tender area on right breast, no axillary lymphadenopathy Musculoskeletal:     Cervical back: Normal range of motion and neck supple.  Lymphadenopathy:     Cervical: No cervical adenopathy.  Skin:    General: Skin is warm and dry.     Findings: No rash.  Neurological:     General: No focal deficit present.     Mental Status: She  is alert.     Comments:  CN 2-12 largely intact except for decreased hearing noted on right  FTN intact EOMI  Psychiatric:        Mood and Affect: Mood normal.        Behavior: Behavior normal.       Assessment & Plan:  This visit occurred during the SARS-CoV-2 public health emergency.  Safety protocols were in place,  including screening questions prior to the visit, additional usage of staff PPE, and extensive cleaning of exam room while observing appropriate contact time as indicated for disinfecting solutions.   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 She is overdue for [facility] f/u - will call Dr Ainsley Spinner office to schedule appt.       Intractable episodic headache - Primary    Ongoing R temporal headache of unclear etiology, followed by neurology. ?migraine with R facial paresthesia as aura - continue effexor 37.5mg  bid preventatively. Latest recommendation was to trial monthly Emgality.       Relevant Medications   traMADol (ULTRAM) 50 MG tablet   Fibromyalgia    Reasonable to try off lyrica - see below. She is worried it is contributing to weight gain.       Relevant Medications   traMADol (ULTRAM) 50 MG tablet   Facial paresthesia    ?aura to migraines. Has seen neurology and vascular surgery.       Monckeberg's medial sclerosis    Complicates atherosclerosis.  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       PAD (peripheral artery disease) (HCC)   Breast pain, right    Discomfort over several months to R upper outer breast without appreciable mass. She has screening mammo scheduled for next month - will order diagnostic mammo and [place] for R breast pain.       Relevant Orders   MM Digital  Diagnostic Bilat   US BREAST LTD UNI RIGHT INC AXILLA   US BREAST LTD UNI LEFT INC AXILLA   Unsteadiness    Notes unsteadiness with standing - feels like she falls forward but fortunately without fall. ?med related - will reassess symptoms off lyrica.           Meds ordered this encounter  Medications  . traMADol (ULTRAM) 50 MG tablet    Sig: Take 0.5-1 tablets (25-50 mg total) by mouth 2 (two) times daily as needed for up to 5 days for moderate pain.    Dispense:  20 tablet    Refill:  0   Orders Placed This Encounter  Procedures  . MM Digital Diagnostic Bilat    Standing Status:   Future    Standing Expiration Date:   02/13/2022    Order Specific Question:   Reason for Exam (SYMPTOM  OR DIAGNOSIS REQUIRED)    Answer:   R breast pain 10-11 o clock    Order Specific Question:   Preferred imaging location?    Answer:   [facility]  . US BREAST LTD UNI RIGHT INC AXILLA    Standing Status:   Future    Standing Expiration Date:   02/13/2022    Order Specific Question:   Reason for Exam (SYMPTOM  OR DIAGNOSIS REQUIRED)    Answer:   R breast pain 10-11 o clock    Order Specific Question:   Preferred imaging location?    Answer:   [facility]  . US BREAST LTD UNI LEFT INC AXILLA    Standing Status:   Future    Standing Expiration Date:   02/13/2022    Order Specific Question:   Reason for Exam (SYMPTOM  OR DIAGNOSIS REQUIRED)    Answer:   R breast pain 10-11 o clock    Order Specific Question:   Preferred imaging location?    Answer:   [facility]    Patient Instructions  Call for follow up visit with Dr Carlis Abbott.  Reasonable to trial off lyrica, but monitor for worsening headache, nerve pain, fatigue.  We will check detailed mammogram and possible ultrasound for right breast pain.  Good to see you today Keep follow up appointment for next month.    Follow up plan: Return if symptoms worsen or fail to improve.  [name], MD

## 2021-02-12 NOTE — Patient Instructions (Addendum)
Call for follow up visit with Dr Carlis Abbott.  Reasonable to trial off lyrica, but monitor for worsening headache, nerve pain, fatigue.  We will check detailed mammogram and possible ultrasound for right breast pain.  Good to see you today Keep follow up appointment for next month.

## 2021-02-13 ENCOUNTER — Encounter: Payer: Self-pay | Admitting: Family Medicine

## 2021-02-13 DIAGNOSIS — R2681 Unsteadiness on feet: Secondary | ICD-10-CM | POA: Insufficient documentation

## 2021-02-13 DIAGNOSIS — N644 Mastodynia: Secondary | ICD-10-CM | POA: Insufficient documentation

## 2021-02-13 NOTE — Assessment & Plan Note (Addendum)
Complicates atherosclerosis.  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

## 2021-02-13 NOTE — Assessment & Plan Note (Signed)
Discomfort over several months to R upper outer breast without appreciable mass. She has screening mammo scheduled for next month - will order diagnostic mammo and [place] for R breast pain.

## 2021-02-13 NOTE — Assessment & Plan Note (Signed)
Chronic, stable, adequate on losartan 25mg  daily, toprol XL 50mg  daily

## 2021-02-13 NOTE — Assessment & Plan Note (Signed)
Ongoing R temporal headache of unclear etiology, followed by neurology. ?migraine with R facial paresthesia as aura - continue effexor 37.5mg  bid preventatively. Latest recommendation was to trial monthly Emgality.

## 2021-02-13 NOTE — Assessment & Plan Note (Addendum)
Reasonable to try off lyrica - see below. She is worried it is contributing to weight gain.

## 2021-02-13 NOTE — Assessment & Plan Note (Signed)
?  aura to migraines. Has seen neurology and vascular surgery.

## 2021-02-13 NOTE — Assessment & Plan Note (Signed)
Recent carotid US reviewed. She is overdue for [facility] f/u - will call Dr Ainsley Spinner office to schedule appt.

## 2021-02-13 NOTE — Assessment & Plan Note (Signed)
Notes unsteadiness with standing - feels like she falls forward but fortunately without fall. ?med related - will reassess symptoms off lyrica.

## 2021-02-17 ENCOUNTER — Telehealth: Payer: Self-pay

## 2021-02-17 ENCOUNTER — Telehealth: Payer: Self-pay | Admitting: Internal Medicine

## 2021-02-17 DIAGNOSIS — E1169 Type 2 diabetes mellitus with other specified complication: Secondary | ICD-10-CM

## 2021-02-17 MED ORDER — TRESIBA FLEXTOUCH 100 UNIT/ML ~~LOC~~ SOPN: 40.0000 [IU] | PEN_INJECTOR | Freq: Every day | SUBCUTANEOUS | 0 refills | Status: DC

## 2021-02-17 NOTE — Telephone Encounter (Signed)
Ok, if no lows. C

## 2021-02-17 NOTE — Telephone Encounter (Signed)
Updated Rx sent to preferred pharmacy.

## 2021-02-17 NOTE — Chronic Care Management (AMB) (Addendum)
Chronic Care Management Pharmacy Assistant   Name: Cassandra Benson  MRN: [id number] DOB: [birth year]  Reason for Encounter: Medication Adherence and Delivery Coordination  Recent office visits:  02/12/21- Dr. Danise Benson- PCP- Started Tramadol 25-50 mg BID. Discontinued Pregabalin 25 mg due to weight gain. Ordered diagnostic mammo and [place] for R breast pain. Follow up PRN. Keep appointment for next month.   Recent consult visits:  02/05/21- Dr. Rockey Benson- Cardiology- No medication changes. Follow up in 1 year.   Hospital visits:  None in previous 6 months  Medications: Outpatient Encounter Medications as of 02/17/2021  Medication Sig   acetaminophen (TYLENOL) 500 MG tablet Take 1 tablet (500 mg total) by mouth 3 (three) times daily as needed.   Ascorbic Acid (VITAMIN C) 100 MG tablet Take 100 mg by mouth daily.   BD ULTRA-FINE LANCETS lancets Use as instructed upto 6 times daily   benzonatate (TESSALON) 100 MG capsule Take 1 capsule (100 mg total) by mouth 3 (three) times daily as needed for cough.   Cholecalciferol (VITAMIN D3) 25 MCG (1000 UT) CAPS Take 1 capsule (1,000 Units total) by mouth daily.   clopidogrel (PLAVIX) 75 MG tablet TAKE 1 TABLET(75 MG) BY MOUTH DAILY   Continuous Blood Gluc Transmit (DEXCOM G6 TRANSMITTER) MISC 1 kit by Does not apply route See admin instructions. Use to check blood sugar   diclofenac Sodium (VOLTAREN) 1 % GEL Apply 2 g topically 3 (three) times daily.   glucagon 1 MG injection Inject 1 mg into the muscle once as needed for up to 1 dose.   glucose blood test strip Use as instructed to check glucose 4 times daily.   insulin aspart (NOVOLOG) 100 UNIT/ML injection USE UP TO 50 UNITS DAILY IN THE INSULIN PUMP   insulin degludec (TRESIBA FLEXTOUCH) 100 UNIT/ML FlexTouch Pen Inject 28-30 Units into the skin daily.   Insulin Pen Needle (PEN NEEDLES) 32G X 5 MM MISC Use 1x a day   losartan (COZAAR) 25 MG tablet Take 1 tablet (25 mg total) by mouth daily.    metoprolol succinate (TOPROL-XL) 50 MG 24 hr tablet Take 1 tablet (50 mg total) by mouth at bedtime. Take with or immediately following a meal.   nitroGLYCERIN (NITROSTAT) 0.4 MG SL tablet Place 1 tablet (0.4 mg total) under the tongue every 5 (five) minutes as needed for chest pain.   rosuvastatin (CRESTOR) 20 MG tablet Take 1 tablet (20 mg total) by mouth daily.   SYNTHROID 75 MCG tablet Take 1 tablet (75 mcg total) by mouth daily before breakfast. Don't take any on Sundays   traMADol (ULTRAM) 50 MG tablet Take 0.5-1 tablets (25-50 mg total) by mouth 2 (two) times daily as needed for up to 5 days for moderate pain.   venlafaxine (EFFEXOR) 37.5 MG tablet Take 37.5 mg once a day for a week then increase to 37.5 mg twice a day and continue that dose   No facility-administered encounter medications on file as of 02/17/2021.   Reviewed chart for medication changes ahead of medication coordination call.  No OVs, Consults, or hospital visits since last care coordination call/Pharmacist visit.   No medication changes indicated.  BP Readings from Last 3 Encounters:  02/12/21 140/68  02/05/21 110/60  12/22/20 136/70    Lab Results  Component Value Date   HGBA1C 9.7 (A) 12/03/2020    Reviewed chart for medication changes ahead of medication coordination call.  BP Readings from Last 3 Encounters:  02/12/21 140/68  02/05/21  110/60  12/22/20 136/70    Lab Results  Component Value Date   HGBA1C 9.7 (A) 12/03/2020     Patient obtains medications through Vials  30 Days   Last adherence delivery included: 12/29/20 Crestor 20 mg 1 tablet daily (evening meal) Plavix 75 mg 1 tablet daily (evening meal) Losartan 25 mg 1 tablet daily (breakfast) Metoprolol ER 50 mg 1 tablet daily (bedtime) Synthroid 75 mcg 1 tablet daily Monday-Saturday except Thursdays (before breakfast) Venlafaxine 37.5 mg 1 tablet twice daily (breakfast and evening meal)  Patient is due for next adherence delivery on:  03/02/21  Called patient and reviewed medications and coordinated delivery.  This delivery to include: [place] flextouch 40 units into skin daily (states it was increased from 26 units to 40 units)  Crestor 34m 1 tablet daily (evening meal) Synthorid 750m 1 tablet daily Monday-Saturday except Thursdays (before breakfast) Venlafaxine 37.55m55m tablet twice daily (breakfast and evening meal) Novolog Flexpen inject up to 50 units daily in insulin pump (states she uses 30-35 units daily)  One touch ultra test strips Use to check BG up to 4 times daily  Pen Needles 32g 4mm52me 4-5 times a day  Patient declined the following medications:  Will complete Acute form for 03/08/21 delivery Plavix 755mg30mablet daily (evening meal) has 23 tablets remaining Losartan 255mg 1mblet daily (breakfast)- has 23 tablets remaining.  Metoprolol 50mg 166mlet daily (bedtime)- has 23 tablets remaining  Other Benzonatate 100 mg- completed course Diclofenac gel 1% uses PRN Clindamycin topical solution- completed course Glucagon Emergency Kit 1 mg- PRN use Pregabalin 25 mg 1 tablet daily (breakfast)- patient holding because she thinks it is causing weight gain Zetia 10 mg 1 tablet daily - due to side effects  Patient does not need refills on any medications.   Checked blood sugar while on the phone with me, stated BG was 301. She states she has not eaten breakfast. She also had another reading before meal of 130 02/16/21.  Contacted Dr. GhergheEna Benson to verify dose change of Tresiba. Patient states she is taking 40 units but Dr. GhergheArman Filterote only mentions 28-30 units of Tresiba a day. Asked for call back or updated prescription.   Confirmed delivery date of 03/02/21, advised patient that pharmacy will contact them the morning of delivery.  Follow-Up:  Pharmacist Review  MichellDebbora Dusotified  LindsayMargaretmary DysliCapulinant [phone] reviewed the care  management and care coordination activities outlined in this encounter and I am certifying that I agree with the content of this note. No further action required.  MichellDebbora DusD Clinical Pharmacist [phone]

## 2021-02-17 NOTE — Telephone Encounter (Signed)
Last OV note states pt is taking 28-30 units, Ok to approve increase?

## 2021-02-17 NOTE — Telephone Encounter (Signed)
[facility] is calling on behalf of patient stating that patient is taking 40 units insulin degludec (TRESIBA FLEXTOUCH) 100 UNIT/ML FlexTouch Pen last note says 28-30   [facility] would like a call back

## 2021-02-18 ENCOUNTER — Other Ambulatory Visit: Payer: Self-pay | Admitting: *Deleted

## 2021-02-18 DIAGNOSIS — I6522 Occlusion and stenosis of left carotid artery: Secondary | ICD-10-CM

## 2021-02-19 NOTE — Telephone Encounter (Signed)
Rx was no received. Looks like it was ordered as a no print. Can you resend to [facility]? thanks!

## 2021-02-22 MED ORDER — TRESIBA FLEXTOUCH 100 UNIT/ML ~~LOC~~ SOPN: 40.0000 [IU] | PEN_INJECTOR | Freq: Every day | SUBCUTANEOUS | 0 refills | Status: DC

## 2021-02-22 NOTE — Telephone Encounter (Signed)
Rx updated.

## 2021-02-22 NOTE — Addendum Note (Signed)
Addended by: Lauralyn Primes on: 02/22/2021 07:41 PM   Modules accepted: Orders

## 2021-02-23 ENCOUNTER — Encounter: Payer: Self-pay | Admitting: Family Medicine

## 2021-02-23 DIAGNOSIS — R42 Dizziness and giddiness: Secondary | ICD-10-CM | POA: Diagnosis not present

## 2021-02-23 DIAGNOSIS — H538 Other visual disturbances: Secondary | ICD-10-CM | POA: Diagnosis not present

## 2021-02-23 DIAGNOSIS — R413 Other amnesia: Secondary | ICD-10-CM | POA: Diagnosis not present

## 2021-02-23 DIAGNOSIS — H9313 Tinnitus, bilateral: Secondary | ICD-10-CM | POA: Diagnosis not present

## 2021-02-23 DIAGNOSIS — F40298 Other specified phobia: Secondary | ICD-10-CM | POA: Diagnosis not present

## 2021-02-23 DIAGNOSIS — R202 Paresthesia of skin: Secondary | ICD-10-CM | POA: Diagnosis not present

## 2021-02-23 DIAGNOSIS — R2689 Other abnormalities of gait and mobility: Secondary | ICD-10-CM | POA: Diagnosis not present

## 2021-02-23 DIAGNOSIS — R519 Headache, unspecified: Secondary | ICD-10-CM | POA: Diagnosis not present

## 2021-02-23 DIAGNOSIS — H53149 Visual discomfort, unspecified: Secondary | ICD-10-CM | POA: Diagnosis not present

## 2021-02-24 DIAGNOSIS — E113293 Type 2 diabetes mellitus with mild nonproliferative diabetic retinopathy without macular edema, bilateral: Secondary | ICD-10-CM | POA: Diagnosis not present

## 2021-03-05 ENCOUNTER — Other Ambulatory Visit: Payer: Self-pay

## 2021-03-05 ENCOUNTER — Ambulatory Visit
Admission: RE | Admit: 2021-03-05 | Discharge: 2021-03-05 | Disposition: A | Discharge status: Home / Self Care | Payer: HMO | Source: Ambulatory Visit | Attending: Family Medicine | Admitting: Family Medicine

## 2021-03-05 DIAGNOSIS — N644 Mastodynia: Secondary | ICD-10-CM | POA: Insufficient documentation

## 2021-03-05 DIAGNOSIS — R922 Inconclusive mammogram: Secondary | ICD-10-CM | POA: Diagnosis not present

## 2021-03-07 ENCOUNTER — Encounter: Payer: Self-pay | Admitting: Family Medicine

## 2021-03-09 ENCOUNTER — Encounter: Payer: Self-pay | Admitting: Vascular Surgery

## 2021-03-09 ENCOUNTER — Ambulatory Visit: Payer: HMO | Admitting: Vascular Surgery

## 2021-03-09 ENCOUNTER — Ambulatory Visit (HOSPITAL_COMMUNITY)
Admission: RE | Admit: 2021-03-09 | Discharge: 2021-03-09 | Disposition: A | Discharge status: Home / Self Care | Payer: HMO | Source: Ambulatory Visit | Attending: Vascular Surgery | Admitting: Vascular Surgery

## 2021-03-09 ENCOUNTER — Other Ambulatory Visit: Payer: Self-pay

## 2021-03-09 VITALS — BP 142/81 | HR 90 | Temp 97.6°F | Ht 63.0 in | Wt 162.0 lb

## 2021-03-09 DIAGNOSIS — I6522 Occlusion and stenosis of left carotid artery: Secondary | ICD-10-CM

## 2021-03-09 NOTE — Progress Notes (Signed)
Patient name: Cassandra Benson MRN: [id number] DOB: Feb 01, 1941 Sex: female  REASON FOR VISIT: 1 year follow-up carotid surveillance, previous left TCAR  HPI: Cassandra Benson is a 80 y.o. female that presents for one year follow-up of her carotid disease.  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  She remains on aspirin Plavix statin.  She has had no new neurologic events over the last year except for one event where she couldn't remember what she was saying.  She did have an MRI brain on 01/08/21 that showed some small vessel disease in the brain as well as PE study that ruled out PE when she had shortness of breath in March.  No other focal deficits  Past Medical History:  Diagnosis Date   Allergy    ANA positive    positive ANA pattern 1 speckled   Arthritis    Carotid stenosis, asymptomatic 06/19/2015   7-48% RICA 27-07% LICA rpt 1 yr (04/6753)    Colon polyps    Dermatomyositis ([place])    Diabetes mellitus without complication ([place])    Type 1   Diverticulosis    sigmoid on CT scan 12/2019   Family history of adverse reaction to anesthesia    brothr went into cardiac arrest from anectine   Fibromyalgia    prior PCP   GERD (gastroesophageal reflux disease)    prior PCP   Glaucoma    Narrow angle   History of blood clots    DVT, in 20s, none since   History of chicken pox    History of diverticulitis    History of pericarditis 1986   with hospitalization   History of pneumonia 2014   History of shingles    History of UTI    Hyperlipidemia    Hypertension    Hypothyroidism    Mixed connective tissue disease ([place])    Partial small bowel obstruction ([place]) 12/2019   managed conservatively   Peptic ulcer    Pneumonia    PONV (postoperative nausea and vomiting)    Raynaud's disease without gangrene    Shoulder pain left   h/o RTC tendonitis and adhesive capsulitis   Sjogren's  syndrome ([place])    Sleep apnea    prior PCP - no CPAP for about 10 yrs   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[employer])    Vitamin D deficiency    prior PCP    Past Surgical History:  Procedure Laterality Date   ABDOMINAL HYSTERECTOMY  1978   fibroids and menorrhagia, ovaries remain   ARTERY BIOPSY Right 04/06/2018   Procedure: BIOPSY TEMPORAL ARTERY RIGHT;  Surgeon: Beverly Gust, MD;  Location: [place];  Service: ENT;  Laterality: Right;  Diabetic - insulin pump sleep apnea   [facility] normal per patient   COLONOSCOPY  10/2011   1 TA, 1 HP, very tortuous colon ([place])   COLONOSCOPY  02/2020   TA, inflammatory polyp, mod diverticulosis, int/ext hemorrhoids ([place]) no rpt recommended    COLONOSCOPY WITH ESOPHAGOGASTRODUODENOSCOPY (EGD)  03/2007   2 ulcers, benign polyp, rpt 5 yrs [facility])   JOINT REPLACEMENT Right    hip   PARTIAL HIP ARTHROPLASTY  2013   Right hip replacement   TONSILLECTOMY     TONSILLECTOMY AND ADENOIDECTOMY     TRANSCAROTID ARTERY REVASCULARIZATION  Left 07/23/2018   Procedure: TRANSCAROTID ARTERY REVASCULARIZATION;  Surgeon:  Marty Heck, MD;  [facility]: [facility] OR;  Service: Vascular;  Laterality: Left;   TUBAL LIGATION     VAGINAL DELIVERY     x2, no complications    Family History  Problem Relation Age of Onset   CAD Mother 100       MI, aortic valve issues   COPD Mother    Lupus Mother    [name] Mother    Rheum arthritis Mother    CAD Father 5       CABG x2, aortic valve replacement   Stroke Sister    CAD Sister    18 Sister        brain   Lupus Sister    Diabetes Sister    Alcohol abuse Brother    CAD Brother 11       MI   Stroke Brother    Seizures Son    COPD Brother        agent orange   CAD Brother 44       stent   Diabetes Brother    Depression Grandchild    Breast cancer Maternal Aunt    Diabetes Sister    Breast cancer Sister    Breast cancer Maternal Aunt     Stroke Maternal Grandmother    Hypertension Maternal Grandmother    Gallbladder disease Maternal Grandmother    Colon cancer Neg Hx    Esophageal cancer Neg Hx    Rectal cancer Neg Hx    Stomach cancer Neg Hx     SOCIAL HISTORY: Social History   Tobacco Use   Smoking status: Never   Smokeless tobacco: Never  Substance Use Topics   Alcohol use: No    Allergies  Allergen Reactions   Iodinated Diagnostic Agents Other (See Comments)    Itching (severe) and chest tightness   Penicillins Anaphylaxis, Swelling, Rash and Other (See Comments)    Has patient had a PCN reaction causing immediate rash, facial/tongue/throat swelling, SOB or lightheadedness with hypotension: Yes Has patient had a PCN reaction causing severe rash involving mucus membranes or skin necrosis: yes - remotely Has patient had a PCN reaction that required hospitalization: occurred while hospitalized Has patient had a PCN reaction occurring within the last 10 years: No If all of the above answers are "NO", then may proceed with Cephalosporin use.    Amlodipine Swelling    Pedal edema   Anectine [Succinylcholine] Other (See Comments)    Brother went into cardiac arrest.   Codeine Nausea Only   Gabapentin Other (See Comments)    Gait abnormality   Influenza Vaccines Other (See Comments)    Muscle weakness; unable to walk   Nortriptyline Other (See Comments)    Eye swelling and mouth drawed up   Pamelor [Nortriptyline Hcl] Other (See Comments)    Patient states caused her face to draw in together.   Valsartan Other (See Comments) and Cough    Allergy to generic only, "Hacking" cough   Zetia [Ezetimibe] Other (See Comments)    Bad muscle cramps   Erythromycin Rash and Swelling   Sulfa Antibiotics Rash    Current Outpatient Medications  Medication Sig Dispense Refill   acetaminophen (TYLENOL) 500 MG tablet Take 1 tablet (500 mg total) by mouth 3 (three) times daily as needed.     Ascorbic Acid (VITAMIN  C) 100 MG tablet Take 100 mg by mouth daily.     BD ULTRA-FINE LANCETS lancets Use as instructed upto 6 times daily  600 each 2   benzonatate (TESSALON) 100 MG capsule Take 1 capsule (100 mg total) by mouth 3 (three) times daily as needed for cough. 30 capsule 3   Cholecalciferol (VITAMIN D3) 25 MCG (1000 UT) CAPS Take 1 capsule (1,000 Units total) by mouth daily. 30 capsule    clopidogrel (PLAVIX) 75 MG tablet TAKE 1 TABLET(75 MG) BY MOUTH DAILY 30 tablet 6   Continuous Blood Gluc Transmit (DEXCOM G6 TRANSMITTER) MISC 1 kit by Does not apply route See admin instructions. Use to check blood sugar 1 each 1   diclofenac Sodium (VOLTAREN) 1 % GEL Apply 2 g topically 3 (three) times daily. 100 g 3   glucagon 1 MG injection Inject 1 mg into the muscle once as needed for up to 1 dose. 1 each 12   glucose blood test strip Use as instructed to check glucose 4 times daily. 400 each 3   insulin aspart (NOVOLOG) 100 UNIT/ML injection USE UP TO 50 UNITS DAILY IN THE INSULIN PUMP 50 mL 0   insulin degludec (TRESIBA FLEXTOUCH) 100 UNIT/ML FlexTouch Pen Inject 40 Units into the skin daily. 36 mL 0   Insulin Pen Needle (PEN NEEDLES) 32G X 5 MM MISC Use 1x a day 100 each 3   losartan (COZAAR) 25 MG tablet Take 1 tablet (25 mg total) by mouth daily. 90 tablet 3   metoprolol succinate (TOPROL-XL) 50 MG 24 hr tablet Take 1 tablet (50 mg total) by mouth at bedtime. Take with or immediately following a meal. 90 tablet 1   nitroGLYCERIN (NITROSTAT) 0.4 MG SL tablet Place 1 tablet (0.4 mg total) under the tongue every 5 (five) minutes as needed for chest pain. 25 tablet 1   rosuvastatin (CRESTOR) 20 MG tablet Take 1 tablet (20 mg total) by mouth daily. 90 tablet 3   SYNTHROID 75 MCG tablet Take 1 tablet (75 mcg total) by mouth daily before breakfast. Don't take any on Sundays 80 tablet 1   venlafaxine (EFFEXOR) 37.5 MG tablet Take 37.5 mg once a day for a week then increase to 37.5 mg twice a day and continue that dose      No current facility-administered medications for this visit.    REVIEW OF SYSTEMS:  _0  denotes positive finding, _1  denotes negative finding Cardiac  Comments:  Chest pain or chest pressure:    Shortness of breath upon exertion:    Short of breath when lying flat:    Irregular heart rhythm:        Vascular    Pain in calf, thigh, or hip brought on by ambulation:    Pain in feet at night that wakes you up from your sleep:     Blood clot in your veins:    Leg swelling:         Pulmonary    Oxygen at home:    Productive cough:     Wheezing:         Neurologic    Sudden weakness in arms or legs:     Sudden numbness in arms or legs:     Sudden onset of difficulty speaking or slurred speech:    Temporary loss of vision in one eye:     Problems with dizziness:         Gastrointestinal    Blood in stool:     Vomited blood:         Genitourinary    Burning when urinating:     Blood in  urine:        Psychiatric    Major depression:         Hematologic    Bleeding problems:    Problems with blood clotting too easily:        Skin    Rashes or ulcers:        Constitutional    Fever or chills:      PHYSICAL EXAM: Vitals:   03/09/21 1545  BP: (!) 142/81  Pulse: 90  Temp: 97.6 F (36.4 C)  TempSrc: Skin  SpO2: 97%  Weight: 162 lb (73.5 kg)  Height: _0  (1.6 m)    GENERAL: The patient is a well-nourished female, in no acute distress. The vital signs are documented above. CARDIAC: There is a regular rate and rhythm.  VASCULAR:  Left neck incision c/d/i PULMONARY: There is good air exchange bilaterally without wheezing or rales. NEUROLOGIC: No focal weakness or paresthesias are detected.  CN II-XII grossly intact.  DATA:   Carotid duplex today shows widely patent left carotid stent with no significant contralateral disease  Assessment/Plan:  80 year old female that presents for 1 year follow-up for surveillance of her left carotid stent after left  TCAR on 07/23/2018 for asymptomatic high-grade stenosis.  Discussed that her left carotid stent is widely patent on duplex today.  She has no significant contralateral disease.  She needs remain on dual antiplatelet therapy and statin for overall risk reduction.  We will see her back in a year.   Marty Heck, MD [facility] Office: [place]

## 2021-03-16 ENCOUNTER — Encounter: Payer: Self-pay | Admitting: Family Medicine

## 2021-03-16 ENCOUNTER — Ambulatory Visit (INDEPENDENT_AMBULATORY_CARE_PROVIDER_SITE_OTHER): Payer: HMO | Admitting: Family Medicine

## 2021-03-16 ENCOUNTER — Other Ambulatory Visit: Payer: Self-pay

## 2021-03-16 VITALS — BP 120/64 | HR 84 | Temp 97.5°F | Ht 63.0 in | Wt 159.4 lb

## 2021-03-16 DIAGNOSIS — I6522 Occlusion and stenosis of left carotid artery: Secondary | ICD-10-CM | POA: Diagnosis not present

## 2021-03-16 DIAGNOSIS — R519 Headache, unspecified: Secondary | ICD-10-CM

## 2021-03-16 DIAGNOSIS — I739 Peripheral vascular disease, unspecified: Secondary | ICD-10-CM

## 2021-03-16 DIAGNOSIS — E139 Other specified diabetes mellitus without complications: Secondary | ICD-10-CM

## 2021-03-16 DIAGNOSIS — I7090 Unspecified atherosclerosis: Secondary | ICD-10-CM | POA: Diagnosis not present

## 2021-03-16 NOTE — Assessment & Plan Note (Signed)
Recent reassuring VVS visit (02/2021).  Planned yearly check

## 2021-03-16 NOTE — Assessment & Plan Note (Signed)
Good pedal pulses noted today

## 2021-03-16 NOTE — Assessment & Plan Note (Signed)
Followed by neurology ,appreciate their care. Continues effexor 37.5mg  bid, planning to trial Emgality monthly injections.

## 2021-03-16 NOTE — Assessment & Plan Note (Signed)
Upcoming endo f/u later this week. Appreciate Dr Arman Filter care. Considering returning to insulin pump.

## 2021-03-16 NOTE — Progress Notes (Signed)
Patient ID: Cassandra Benson, female    DOB: 1941/07/21, 80 y.o.   MRN: [id number]  This visit was conducted in person.  BP 120/64   Pulse 84   Temp (!) 97.5 F (36.4 C) (Temporal)   Ht _0  (1.6 m)   Wt 159 lb 7 oz (72.3 kg)   SpO2 98%   BMI 28.24 kg/m    CC: 6 mo f/u visit  Subjective:   HPI: Cassandra Benson is a 80 y.o. female presenting on 03/16/2021 for Follow-up (Here for 6 mo f/u.)   See prior notes for details. Saw Dr Carlis Abbott VVS last week, good report. S/p L TCAR on 06/2018 for high grade asymptomatic stenosis. Recommended continue DAPT.   Saw neurology last month - planning trial of monthly emgality for injections, continued effexor 37.49m bid. ESR/CRP were normal as was B12. Hasn't yet started but has at home.   She continues crestor as well as plavix daily.  Sugars running elevated. She is seen endo later this month.      Relevant past medical, surgical, family and social history reviewed and updated as indicated. Interim medical history since our last visit reviewed. Allergies and medications reviewed and updated. Outpatient Medications Prior to Visit  Medication Sig Dispense Refill   acetaminophen (TYLENOL) 500 MG tablet Take 1 tablet (500 mg total) by mouth 3 (three) times daily as needed.     Ascorbic Acid (VITAMIN C) 100 MG tablet Take 100 mg by mouth daily.     BD ULTRA-FINE LANCETS lancets Use as instructed upto 6 times daily 600 each 2   benzonatate (TESSALON) 100 MG capsule Take 1 capsule (100 mg total) by mouth 3 (three) times daily as needed for cough. 30 capsule 3   Cholecalciferol (VITAMIN D3) 25 MCG (1000 UT) CAPS Take 1 capsule (1,000 Units total) by mouth daily. 30 capsule    clopidogrel (PLAVIX) 75 MG tablet TAKE 1 TABLET(75 MG) BY MOUTH DAILY 30 tablet 6   Continuous Blood Gluc Transmit (DEXCOM G6 TRANSMITTER) MISC 1 kit by Does not apply route See admin instructions. Use to check blood sugar 1 each 1   diclofenac Sodium (VOLTAREN) 1 % GEL  Apply 2 g topically 3 (three) times daily. 100 g 3   glucagon 1 MG injection Inject 1 mg into the muscle once as needed for up to 1 dose. 1 each 12   glucose blood test strip Use as instructed to check glucose 4 times daily. 400 each 3   insulin aspart (NOVOLOG) 100 UNIT/ML injection USE UP TO 50 UNITS DAILY IN THE INSULIN PUMP 50 mL 0   insulin degludec (TRESIBA FLEXTOUCH) 100 UNIT/ML FlexTouch Pen Inject 40 Units into the skin daily. 36 mL 0   Insulin Pen Needle (PEN NEEDLES) 32G X 5 MM MISC Use 1x a day 100 each 3   losartan (COZAAR) 25 MG tablet Take 1 tablet (25 mg total) by mouth daily. 90 tablet 3   metoprolol succinate (TOPROL-XL) 50 MG 24 hr tablet Take 1 tablet (50 mg total) by mouth at bedtime. Take with or immediately following a meal. 90 tablet 1   nitroGLYCERIN (NITROSTAT) 0.4 MG SL tablet Place 1 tablet (0.4 mg total) under the tongue every 5 (five) minutes as needed for chest pain. 25 tablet 1   rosuvastatin (CRESTOR) 20 MG tablet Take 1 tablet (20 mg total) by mouth daily. 90 tablet 3   SYNTHROID 75 MCG tablet Take 1 tablet (75 mcg total) by  mouth daily before breakfast. Don't take any on Sundays 80 tablet 1   venlafaxine (EFFEXOR) 37.5 MG tablet Take 37.5 mg once a day for a week then increase to 37.5 mg twice a day and continue that dose     No facility-administered medications prior to visit.     Per HPI unless specifically indicated in ROS section below Review of Systems Objective:  BP 120/64   Pulse 84   Temp (!) 97.5 F (36.4 C) (Temporal)   Ht $R'5\' 3"'pO$  (1.6 m)   Wt 159 lb 7 oz (72.3 kg)   SpO2 98%   BMI 28.24 kg/m   Wt Readings from Last 3 Encounters:  03/16/21 159 lb 7 oz (72.3 kg)  03/09/21 162 lb (73.5 kg)  02/12/21 162 lb 2 oz (73.5 kg)      Physical Exam Vitals and nursing note reviewed.  Constitutional:      Appearance: Normal appearance. She is not ill-appearing.  Eyes:     Extraocular Movements: Extraocular movements intact.     Conjunctiva/sclera:  Conjunctivae normal.     Pupils: Pupils are equal, round, and reactive to light.  Cardiovascular:     Rate and Rhythm: Normal rate and regular rhythm.     Pulses: Normal pulses.     Heart sounds: Normal heart sounds. No murmur heard. Pulmonary:     Effort: Pulmonary effort is normal. No respiratory distress.     Breath sounds: Normal breath sounds. No wheezing, rhonchi or rales.  Musculoskeletal:     Right lower leg: No edema.     Left lower leg: No edema.  Skin:    General: Skin is warm and dry.     Findings: No rash.  Neurological:     Mental Status: She is alert.  Psychiatric:        Mood and Affect: Mood normal.        Behavior: Behavior normal.      Diabetic Foot Exam - Simple   Simple Foot Form Diabetic Foot exam was performed with the following findings: Yes 03/16/2021  9:29 AM  Visual Inspection No deformities, no ulcerations, no other skin breakdown bilaterally: Yes Sensation Testing See comments: Yes Pulse Check Posterior Tibialis and Dorsalis pulse intact bilaterally: Yes Comments Slightly decreased to monofilament testing     Lab Results  Component Value Date   HGBA1C 9.7 (A) 12/03/2020    Assessment & Plan:  This visit occurred during the SARS-CoV-2 public health emergency.  Safety protocols were in place, including screening questions prior to the visit, additional usage of staff PPE, and extensive cleaning of exam room while observing appropriate contact time as indicated for disinfecting solutions.   Problem List Items Addressed This Visit     Carotid stenosis, 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    Recent reassuring VVS visit (02/2021).  Planned yearly check       LADA (latent autoimmune diabetes in adults), managed as type 1 ([place])    Upcoming endo f/u later this week. Appreciate Dr Arman Filter care. Considering returning to insulin pump.        Intractable episodic headache - Primary    Followed by neurology ,appreciate their care. Continues effexor  37.$RemoveBef'5mg'YQuMNpomcB$  bid, planning to trial Emgality monthly injections.        Monckeberg's medial sclerosis   PAD (peripheral artery disease) (HCC)    Good pedal pulses noted today         No orders of the defined types were placed in this encounter.  No orders of the defined types were placed in this encounter.   Patient Instructions  Keep Thursday appointment with Dr Cruzita Lederer.  Good to see you today  Return as needed or in 3 months for follow up visit.    Follow up plan: Return in about 3 months (around 06/16/2021) for follow up visit.  [name], MD

## 2021-03-16 NOTE — Patient Instructions (Addendum)
Keep Thursday appointment with Dr Cruzita Lederer.  Good to see you today  Return as needed or in 3 months for follow up visit.

## 2021-03-18 ENCOUNTER — Other Ambulatory Visit: Payer: Self-pay

## 2021-03-18 ENCOUNTER — Encounter: Payer: Self-pay | Admitting: Internal Medicine

## 2021-03-18 ENCOUNTER — Telehealth: Payer: Self-pay

## 2021-03-18 ENCOUNTER — Telehealth (INDEPENDENT_AMBULATORY_CARE_PROVIDER_SITE_OTHER): Payer: HMO | Admitting: Internal Medicine

## 2021-03-18 DIAGNOSIS — E063 Autoimmune thyroiditis: Secondary | ICD-10-CM

## 2021-03-18 DIAGNOSIS — E1169 Type 2 diabetes mellitus with other specified complication: Secondary | ICD-10-CM | POA: Diagnosis not present

## 2021-03-18 DIAGNOSIS — E139 Other specified diabetes mellitus without complications: Secondary | ICD-10-CM | POA: Diagnosis not present

## 2021-03-18 DIAGNOSIS — E038 Other specified hypothyroidism: Secondary | ICD-10-CM | POA: Diagnosis not present

## 2021-03-18 DIAGNOSIS — E785 Hyperlipidemia, unspecified: Secondary | ICD-10-CM | POA: Diagnosis not present

## 2021-03-18 NOTE — Chronic Care Management (AMB) (Addendum)
Chronic Care Management Pharmacy Assistant   Name: RIANNAH STAGNER  MRN: [id number] DOB: 1940-11-25  Reason for Encounter: Medication Adherence and Delivery Coordination  Recent office visits:  03/16/21 - Dr.Gutierrez PCP - No medication changes, follow up 3 months  Recent consult visits:  03/18/21 - Endocrinology telemedicine - We will also increase the Tresiba dose (46 units in am) as a provisional measure until she can start on the insulin pump. 03/09/21 - Vascular Surgery - No medication changes  02/23/21 -  Neurology - Start Emgality once monthly. Let me know how headaches are on this.  Hospital visits:   12/04/20 ED visit for chest pain. No admission. 11/19/20 ED visit for headache. No admission.  Medications: Outpatient Encounter Medications as of 03/18/2021  Medication Sig   acetaminophen (TYLENOL) 500 MG tablet Take 1 tablet (500 mg total) by mouth 3 (three) times daily as needed.   Ascorbic Acid (VITAMIN C) 100 MG tablet Take 100 mg by mouth daily.   BD ULTRA-FINE LANCETS lancets Use as instructed upto 6 times daily   benzonatate (TESSALON) 100 MG capsule Take 1 capsule (100 mg total) by mouth 3 (three) times daily as needed for cough.   Cholecalciferol (VITAMIN D3) 25 MCG (1000 UT) CAPS Take 1 capsule (1,000 Units total) by mouth daily.   clopidogrel (PLAVIX) 75 MG tablet TAKE 1 TABLET(75 MG) BY MOUTH DAILY   Continuous Blood Gluc Transmit (DEXCOM G6 TRANSMITTER) MISC 1 kit by Does not apply route See admin instructions. Use to check blood sugar   diclofenac Sodium (VOLTAREN) 1 % GEL Apply 2 g topically 3 (three) times daily.   glucagon 1 MG injection Inject 1 mg into the muscle once as needed for up to 1 dose.   glucose blood test strip Use as instructed to check glucose 4 times daily.   insulin aspart (NOVOLOG) 100 UNIT/ML injection USE UP TO 50 UNITS DAILY IN THE INSULIN PUMP   insulin degludec (TRESIBA FLEXTOUCH) 100 UNIT/ML FlexTouch Pen Inject 40 Units into the skin  daily.   Insulin Pen Needle (PEN NEEDLES) 32G X 5 MM MISC Use 1x a day   losartan (COZAAR) 25 MG tablet Take 1 tablet (25 mg total) by mouth daily.   metoprolol succinate (TOPROL-XL) 50 MG 24 hr tablet Take 1 tablet (50 mg total) by mouth at bedtime. Take with or immediately following a meal.   nitroGLYCERIN (NITROSTAT) 0.4 MG SL tablet Place 1 tablet (0.4 mg total) under the tongue every 5 (five) minutes as needed for chest pain.   rosuvastatin (CRESTOR) 20 MG tablet Take 1 tablet (20 mg total) by mouth daily.   SYNTHROID 75 MCG tablet Take 1 tablet (75 mcg total) by mouth daily before breakfast. Don't take any on Sundays   venlafaxine (EFFEXOR) 37.5 MG tablet Take 37.5 mg once a day for a week then increase to 37.5 mg twice a day and continue that dose   No facility-administered encounter medications on file as of 03/18/2021.   BP Readings from Last 3 Encounters:  03/16/21 120/64  03/09/21 (!) 142/81  02/12/21 140/68    Lab Results  Component Value Date   HGBA1C 9.7 (A) 12/03/2020     Patient obtains medications through Vials  30 Days   Last adherence delivery date: 03/02/2021      Patient is due for next adherence delivery on: 04/01/2021  Multiple attempts made to reach patient on 03/18/21, 03/23/21, 03/24/21. Unsuccessful outreach. Will refill based off of last adherence fill.  This delivery to include: Vials  30 Days Patient is due for the following refills: Crestor 20 mg 1 tablet daily (evening meal) Plavix 75 mg 1 tablet daily (evening meal) Metoprolol ER 50 mg 1 tablet daily (bedtime) Synthroid 75 mcg 1 tablet daily Monday-Saturday except Thursdays (before breakfast) Venlafaxine 37.5 mg 1 tablet twice daily (breakfast and evening meal) One Touch Test Strips Pen Needles  Tresiba Flextouch U-100 - Inject 46 units daily  Novolog - Inject up to 50 units daily in pump (past due - last filled 12/29/20 for 30 DS)  Patient will need the following medications on 04/06/21: Losartan 25  mg -  filled 30 DS 03/08/21 Preservision AREDs 2-  filled 30 DS 03/08/21  No refill request needed from PCP.  Follow-Up:  Coordination of Enhanced Pharmacy Services and Pharmacist Review  Debbora Dus, CPP notified  [name], [place] Assistant [phone]  Patient's medication were synchronized and in packs but she did not like packaging. She called the pharmacy last month and declined all except Preservision and Losartan so medications are no longer synced. We will resync meds today. Acute form placed for losartan and Preservision to be delivered 04/06/21 to sync to other meds.   Debbora Dus, PharmD Clinical Pharmacist [facility] [phone]

## 2021-03-18 NOTE — Progress Notes (Signed)
Patient ID: Cassandra Benson, female   DOB: 1941-07-30, 80 y.o.   MRN: [id number]   Patient location: Home My location: Office Persons participating in the virtual visit: patient, provider  Referring Provider: [name], MD  I connected with the patient on 03/18/21 at  2:52 PM EDT by telephone and verified that I am speaking with the correct person.   I discussed the limitations of evaluation and management by telephone and the availability of in person appointments. The patient expressed understanding and agreed to proceed.   Details of the encounter are shown below.  HPI: Cassandra Benson is a 80 y.o.-year-old female, returning for f/u for LADA, initially dx'ed in 1998 (80 y/o), started insulin at dx, started insulin pump in ~2008, uncontrolled, without complications and also Hashimoto's hypothyroidism. She previously saw endocrinology at [place] (Dr. Janese Banks) and Dr Howell Rucks. Last visit with me 3 months ago.  Interim history: At today's visit, she complains of dysequilibrium, HAs, blurry vision (all chronic) and she preferred the appointment to be carried virtually. She has HAs.  Reviewed HbA1c levels: Lab Results  Component Value Date   HGBA1C 9.7 (A) 12/03/2020   HGBA1C 12.0 (A) 09/04/2020   HGBA1C 9.3 (A) 05/25/2020   She continues on: - Tresiba 20-22 units in am >> off >> restarted 20 >> 24-26 >> 28-30 >> 40 units daily - Novolog: ICR: 1:8 (starches)-1:10 (other foods), but mostly using carb equivalents >> 1:15 (15-20 units) Target: 130 Insulin sensitivity factor: 20 If you correct sugars at bedtime, only correct >200 and only give 2 to 3 units. She was previously using Glucophage ER d.a.w. but had to stop because this was not covered by her insurance.  She tried the generic metformin ER and this caused diarrhea so she had to stop.  Insulin pump:  -Previously: Medtronic 723-started 09/2016 (changed 07/2017), without CGM.   She was using Medtronic for supplies before, but  she is then forced by insurance to use [employer].  She came off the pump in the past as she had a lot of problems getting supplies from them. -then T:slim x2 - started 2020 - stopped since last OV  CGM: -Dexcom G6 >> off now  Prev. Pump settings: - basal rates: 12 am: 0.800 units/h >> 0.950 3 am: 0.900 >> 1.000 9:30 am: 0.650 >> 0.750 5 pm: 1.300 6:30 pm: 1.200 11:30 pm: 1.200 - ICR: 1:8 - target: 110 - ISF:  12 am: 20 8 pm: 30 - Active insulin time: 4h TDD from basal insulin: 52% (18 units) >> 55% (18 units) TDD from bolus insulin: 48% (17 units) >> 45% (15 units) TDD: 35 to 50 units >> 35-50 >> up to 50 units  - extended bolusing: not using - changes infusion site: q3 days  She checks her sugars  2 times a day : - am: 103, 247-378, 416 >> 172-254, 280 >> 150s (115-170) >> 200-300 - 2h after b'fast: n/c - lunch: 188-239 >> 345 >> n/c - 2h after lunch: 292 >> n/c >> 181 >> n/c - dinner: 81, 98-180 >> 254, 260 >> 85 >> 145-160 >> 175-215 - 2h after dinner: 128, 270 >> n/c - bedtime: 255 >> n/c Lowest sugar was 40 x 1 ... 114 >> 45 x1; she has hypoglycemia awareness in the 80s.  No history of hypoglycemia admissions.  She has a glucagon kit at home. Highest sugar was 416 >> 280 >> 300s (no insulin) >> 385; no history of DKA admissions.  Pt's meals are: -  Breakfast: protein drink + almond milk - Lunch: PB jelly sandwich or sandwich with ham or cream cheese sandwich - Dinner: salads  - Snacks: pretzels; pork tenderloin + veggies; chicken + tenderloin No sodas.  She stopped artificial sweeteners (sweet and low) lipids improved.  No CKD, last BUN/creatinine:  Lab Results  Component Value Date   BUN 11 12/04/2020   BUN 12 11/19/2020   CREATININE 0.86 12/04/2020   CREATININE 0.79 11/19/2020  On lisinopril.  + HL;  last set of lipids: Lab Results  Component Value Date   CHOL 155 12/05/2020   HDL 41 12/05/2020   LDLCALC 94 12/05/2020   LDLDIRECT 113.0 09/09/2020    TRIG 102 12/05/2020   CHOLHDL 3.8 12/05/2020  On Crestor 10 -she continues to have muscle cramps despite using CoQ10.  She takes [employer].  Praluent was not covered.  - last eye exam was in 10/2020: No DR, macular degeneration worse - -on Areds She previously saw an ophthalmologist in [place] and would like to establishing care and [place].   - No numbness and tingling in her feet.  She was admitted for CP + SOB 10/01/2017.  Cardiac events and PE were ruled out.  She is seeing cardiology (Dr. Rockey Situ). She had right carotid ultrasound in 2020 and there is a 39% stenosis, significantly increased since 2018.  She has a history of left carotid endarterectomy. She was started on compression hoses by [facility].  These helped. 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  Hypothyroidism: -Due to Hashimoto's thyroiditis -Positive family history of Graves' disease in her mother -She was initially on Levoxyl, but she thought Levoxyl increased her lipid levels and caused hair loss, so we changed to Synthroid.  She feels better on Synthroid.  She is currently on Synthroid 75 mcg 6 out of 7 days: - in am - fasting - at least 30 min from b'fast - no calcium - no iron - + multivitamins at night - no PPIs - not on Biotin  Reviewed her TFTs: Lab Results  Component Value Date   TSH 2.16 09/09/2020   TSH 1.73 11/27/2019   TSH 0.12 (L) 10/07/2019   TSH 0.22 (L) 09/10/2019   TSH 1.10 11/06/2018   TSH 0.09 (L) 08/29/2018   TSH 0.92 11/27/2017   TSH 0.30 (L) 10/11/2017   TSH 0.24 (L) 08/11/2017   TSH 0.58 01/04/2017   She also has a history of SLE and Sjogren's syndrome.  She has generalized muscle aches and joint pains.  In 2015, we checked her hormone levels in the setting of hirsutism and and they were normal.  Free testosterone, LH, FSH, DHEA-S, dexamethasone suppression test and estradiol were all normal.  Also, an AM  cortisol and ACTH levels were normal.  She was admitted 01/15/2020 with SBO.  She was also diagnosed with mild cognitive impairment and was started on Namenda last year.   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  She had negative biopsies in the past.  Not on steroids.  She is on Lyrica and Effexor.  She was in the emergency room for headaches and neck pain on 11/19/2020.  ROS: Constitutional: no weight gain/no weight loss, no fatigue, no subjective hyperthermia, no subjective hypothermia Eyes: + blurry vision, no xerophthalmia ENT: no sore throat, no nodules palpated in neck, no dysphagia, no odynophagia, no hoarseness Cardiovascular: no CP/no SOB/no palpitations/no leg swelling Respiratory: no cough/no SOB/no wheezing  Gastrointestinal: + N/no V/no D/no C/no acid reflux Musculoskeletal: + muscle aches/+ joint aches Skin: no rashes, no hair loss, + acne, which she attributes to NovoLog Neurological: no tremors/no numbness/no tingling/no dizziness, ++ HAs  I reviewed pt's medications, allergies, PMH, social hx, family hx, and changes were documented in the history of present illness. Otherwise, unchanged from my initial visit note.  Past Medical History:  Diagnosis Date   Allergy    ANA positive    positive ANA pattern 1 speckled   Arthritis    Carotid stenosis, asymptomatic 06/19/2015   0-45% RICA 99-77% LICA rpt 1 yr (12/1421)    Colon polyps    Dermatomyositis ([place])    Diabetes mellitus without complication ([place])    Type 1   Diverticulosis    sigmoid on CT scan 12/2019   Family history of adverse reaction to anesthesia    brothr went into cardiac arrest from anectine   Fibromyalgia    prior PCP   GERD (gastroesophageal reflux disease)    prior PCP   Glaucoma    Narrow angle   History of blood clots    DVT, in 20s, none since   History of chicken pox    History of diverticulitis    History of pericarditis 1986   with hospitalization    History of pneumonia 2014   History of shingles    History of UTI    Hyperlipidemia    Hypertension    Hypothyroidism    Mixed connective tissue disease ([place])    Partial small bowel obstruction ([place]) 12/2019   managed conservatively   Peptic ulcer    Pneumonia    PONV (postoperative nausea and vomiting)    Raynaud's disease without gangrene    Shoulder pain left   h/o RTC tendonitis and adhesive capsulitis   Sjogren's syndrome (HCC)    Sleep apnea    prior PCP - no CPAP for about 10 yrs   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[place])    Vitamin D deficiency    prior PCP   Past Surgical History:  Procedure Laterality Date   ABDOMINAL HYSTERECTOMY  1978   fibroids and menorrhagia, ovaries remain   ARTERY BIOPSY Right 04/06/2018   Procedure: BIOPSY TEMPORAL ARTERY RIGHT;  Surgeon: Beverly Gust, MD;  Location: [place];  Service: ENT;  Laterality: Right;  Diabetic - insulin pump sleep apnea   [facility] normal per patient   COLONOSCOPY  10/2011   1 TA, 1 HP, very tortuous colon ([place])   COLONOSCOPY  02/2020   TA, inflammatory polyp, mod diverticulosis, int/ext hemorrhoids ([place]) no rpt recommended    COLONOSCOPY WITH ESOPHAGOGASTRODUODENOSCOPY (EGD)  03/2007   2 ulcers, benign polyp, rpt 5 yrs [facility])   JOINT REPLACEMENT Right    hip   PARTIAL HIP ARTHROPLASTY  2013   Right hip replacement   TONSILLECTOMY     TONSILLECTOMY AND ADENOIDECTOMY     TRANSCAROTID ARTERY REVASCULARIZATION  Left 07/23/2018   Procedure: TRANSCAROTID ARTERY REVASCULARIZATION;  Surgeon: Marty Heck, MD;  Location: [facility];  Service: Vascular;  Laterality: Left;   TUBAL LIGATION     VAGINAL DELIVERY     x2, no complications   Social History   Social History   Widowed    Number of children: 2   Occupational History  Retired    Social History Main Topics   Smoking status: Never Smoker   Smokeless tobacco: Never Used   Alcohol  use No    Drug use: No   Social History Narrative   Lives in [place] now - moved from [place].   No pets.   Mother of Jamila Slatten.   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     Ascorbic Acid (VITAMIN C) 100 MG tablet Take 100 mg by mouth daily.     BD ULTRA-FINE LANCETS lancets Use as instructed upto 6 times daily 600 each 2   benzonatate (TESSALON) 100 MG capsule Take 1 capsule (100 mg total) by mouth 3 (three) times daily as needed for cough. 30 capsule 3   Cholecalciferol (VITAMIN D3) 25 MCG (1000 UT) CAPS Take 1 capsule (1,000 Units total) by mouth daily. 30 capsule    clopidogrel (PLAVIX) 75 MG tablet TAKE 1 TABLET(75 MG) BY MOUTH DAILY 30 tablet 6   Continuous Blood Gluc Transmit (DEXCOM G6 TRANSMITTER) MISC 1 kit by Does not apply route See admin instructions. Use to check blood sugar 1 each 1   diclofenac Sodium (VOLTAREN) 1 % GEL Apply 2 g topically 3 (three) times daily. 100 g 3   glucagon 1 MG injection Inject 1 mg into the muscle once as needed for up to 1 dose. 1 each 12   glucose blood test strip Use as instructed to check glucose 4 times daily. 400 each 3   insulin aspart (NOVOLOG) 100 UNIT/ML injection USE UP TO 50 UNITS DAILY IN THE INSULIN PUMP 50 mL 0   insulin degludec (TRESIBA FLEXTOUCH) 100 UNIT/ML FlexTouch Pen Inject 40 Units into the skin daily. 36 mL 0   Insulin Pen Needle (PEN NEEDLES) 32G X 5 MM MISC Use 1x a day 100 each 3   losartan (COZAAR) 25 MG tablet Take 1 tablet (25 mg total) by mouth daily. 90 tablet 3   metoprolol succinate (TOPROL-XL) 50 MG 24 hr tablet Take 1  tablet (50 mg total) by mouth at bedtime. Take with or immediately following a meal. 90 tablet 1   nitroGLYCERIN (NITROSTAT) 0.4 MG SL tablet Place 1 tablet (0.4 mg total) under the tongue every 5 (five) minutes as needed for chest pain. 25 tablet 1   rosuvastatin (CRESTOR) 20 MG tablet Take 1 tablet (20 mg total) by mouth daily. 90 tablet 3   SYNTHROID 75 MCG tablet Take 1 tablet (75 mcg total) by mouth daily before breakfast. Don't take any on Sundays 80 tablet 1   venlafaxine (EFFEXOR) 37.5 MG tablet Take 37.5 mg once a day for a week then increase to 37.5 mg twice a day and continue that dose     No current facility-administered medications on file prior to visit.   Allergies  Allergen Reactions   Iodinated Diagnostic Agents Other (See Comments)    Itching (severe) and chest tightness   Penicillins Anaphylaxis, Swelling, Rash and Other (See Comments)    Has patient had a PCN reaction causing immediate rash, facial/tongue/throat swelling, SOB or lightheadedness with hypotension: Yes Has patient had a PCN reaction causing severe rash involving mucus membranes or skin necrosis: yes - remotely Has patient had  a PCN reaction that required hospitalization: occurred while hospitalized Has patient had a PCN reaction occurring within the last 10 years: No If all of the above answers are "NO", then may proceed with Cephalosporin use.    Amlodipine Swelling    Pedal edema   Anectine [Succinylcholine] Other (See Comments)    Brother went into cardiac arrest.   Codeine Nausea Only   Gabapentin Other (See Comments)    Gait abnormality   Influenza Vaccines Other (See Comments)    Muscle weakness; unable to walk   Nortriptyline Other (See Comments)    Eye swelling and mouth drawed up   Pamelor [Nortriptyline Hcl] Other (See Comments)    Patient states caused her face to draw in together.   Valsartan Other (See Comments) and Cough    Allergy to generic only, "Hacking" cough   Zetia [Ezetimibe]  Other (See Comments)    Bad muscle cramps   Erythromycin Rash and Swelling   Sulfa Antibiotics Rash   Family History  Problem Relation Age of Onset   CAD Mother 47       MI, aortic valve issues   COPD Mother    Lupus Mother    [name] Mother    Rheum arthritis Mother    CAD Father 78       CABG x2, aortic valve replacement   Stroke Sister    CAD Sister    33 Sister        brain   Lupus Sister    Diabetes Sister    Alcohol abuse Brother    CAD Brother 38       MI   Stroke Brother    Seizures Son    COPD Brother        agent orange   CAD Brother 71       stent   Diabetes Brother    Depression Grandchild    Breast cancer Maternal Aunt    Diabetes Sister    Breast cancer Sister    Breast cancer Maternal Aunt    Stroke Maternal Grandmother    Hypertension Maternal Grandmother    Gallbladder disease Maternal Grandmother    Colon cancer Neg Hx    Esophageal cancer Neg Hx    Rectal cancer Neg Hx    Stomach cancer Neg Hx    PE: There were no vitals taken for this visit. Wt Readings from Last 3 Encounters:  03/16/21 159 lb 7 oz (72.3 kg)  03/09/21 162 lb (73.5 kg)  02/12/21 162 lb 2 oz (73.5 kg)   Constitutional:  in NAD  The physical exam was not performed (phone visit).  ASSESSMENT: 1. DM1, uncontrolled, without long-term complications, but with hyperglycemia  2.  Hashimoto's hypothyroidism  3. HL  PLAN:  1. Patient with uncontrolled LADA, on basal-bolus insulin regimen, with better sugars at last visit but still above target in the morning and before dinner.  We continued the same dose of NovoLog at that time and increased her Tresiba dose slightly.  HbA1c at that time was slightly better, at 9.7%.  We again discussed about starting an insulin pump, which I supported.  I gave her telephone number for the Medtronic rep to see if she can restart her Medtronic pump.  She was not able to get in touch with the rep. -At today's visit, sugars are much  worse, and she does not know why.  Upon questioning, she is drinking sweet drinks, which strongly advised her to stop.  We  will also increase the Tresiba dose as a provisional measure until she can start on the insulin pump.  I advised her to call [employer] and to ask for the contact information for the rep for this area.  She absolutely needs to get back on the pump and she agrees.  She will try to do so in the next few days.  Discussed about starting the same insulin pump settings as before. -She does have a glucagon kit at home.  Discussed about situations in which she has to use this versus glucose tablets/juice. - I advised her to:  Patient Instructions  Please increase: - Tresiba 46 units in am    Please continue: - Novolog: ICR: 1:15 (carb equivalents) Target: 130 Insulin sensitivity factor: 20 130-150: +1 unit 151-170: +2 units 171-190: +3 units 191-210: +4 units 211-230: +5 units 231-250: +6 units >250: +7 units If you correct sugars at bedtime, only correct >200 and only give 2 to 3 units.  Try to switch to the pump.  Please return in 2-3 months with your sugar log.    - we we will check her HbA1c at next visit - advised to check sugars at different times of the day - 4x a day, rotating check times - advised for yearly eye exams >> she is UTD - return to clinic in 2-3 months  2.  Hashimoto's hypothyroidism -She continues to have a palpable thyroid, most likely due to inflammation in the setting of Hashimoto's thyroiditis - latest thyroid labs reviewed with pt. >> normal: Lab Results  Component Value Date   TSH 2.16 09/09/2020  - she continues on LT4 75 mcg 6 out of 7 days - pt feels good on this dose. - we discussed about taking the thyroid hormone every day, with water, >30 minutes before breakfast, separated by >4 hours from acid reflux medications, calcium, iron, multivitamins. Pt. is taking it correctly. - will check thyroid tests at next visit  3. HL -Reviewed  latest lipid panel from 11/2020: LDL above our goal of less than 70: Lab Results  Component Value Date   CHOL 155 12/05/2020   HDL 41 12/05/2020   LDLCALC 94 12/05/2020   LDLDIRECT 113.0 09/09/2020   TRIG 102 12/05/2020   CHOLHDL 3.8 12/05/2020  -She continues on Crestor 10 mg daily.  She does have generalized muscle aches, but she did not feel that these were from the statin.  - Total time spent for the visit: 17 min, in obtaining medical information from the chart, reviewing her  previous labs, imaging evaluations, and treatments, reviewing her symptoms, counseling her about her condition (please see the discussed topics above), and developing a plan to further investigate and treat it; she had a number of questions which I addressed.   Philemon Kingdom, MD PhD [facility] Endocrinology

## 2021-03-18 NOTE — Patient Instructions (Addendum)
Please increase: - Tresiba 46 units in am    Please continue: - Novolog: ICR: 1:15 (carb equivalents) Target: 130 Insulin sensitivity factor: 20 130-150: +1 unit 151-170: +2 units 171-190: +3 units 191-210: +4 units 211-230: +5 units 231-250: +6 units >250: +7 units If you correct sugars at bedtime, only correct >200 and only give 2 to 3 units.  Try to switch to the pump.  Please return in 3 months with your sugar log.

## 2021-03-24 ENCOUNTER — Other Ambulatory Visit: Payer: Self-pay | Admitting: Family Medicine

## 2021-04-07 ENCOUNTER — Encounter: Payer: Self-pay | Admitting: Internal Medicine

## 2021-04-13 DIAGNOSIS — R519 Headache, unspecified: Secondary | ICD-10-CM | POA: Diagnosis not present

## 2021-04-13 DIAGNOSIS — H538 Other visual disturbances: Secondary | ICD-10-CM | POA: Diagnosis not present

## 2021-04-14 ENCOUNTER — Encounter: Payer: Self-pay | Admitting: Family Medicine

## 2021-04-15 ENCOUNTER — Telehealth: Payer: Self-pay

## 2021-04-15 NOTE — Telephone Encounter (Signed)
Patient called to discuss new medications from recent neurology visit. She needs refill on Emgality. She had taken 1 dose previously in March but did not repeat because she thought it was an emergency medicine. She will resume this. Coordinated delivery.  She is concerned about starting carbamazepine due to drug interactions. We reviewed these together. It may reduce concentrations of rosuvastatin and Synthroid (category C interaction - monitoring recommended). Both of these medications would be closely monitored and adjusted if needed. Patient prefers to hold off on starting carbamazepine at this time. She would like to see how she does on Emgality. She is also concerned her migraines are due to microvascular ischemia and medication will not be sufficient. She has messaged Dr. Darnell Level about his opinion.  Debbora Dus, PharmD Clinical Pharmacist [facility] at [facility] [phone]

## 2021-04-20 ENCOUNTER — Telehealth: Payer: Self-pay

## 2021-04-20 NOTE — Chronic Care Management (AMB) (Addendum)
  Chronic Care Management Pharmacy Assistant   Name: Cassandra Benson  MRN: [id number] DOB: 03/28/1941   Reason for Encounter: Medication Adherence and Delivery Coordination   Recent office visits:  None since last CCM contact  Recent consult visits:  04/13/21 - Neurology- Start carbamazepine 100mg once a day for a week, then increase to twice daily. Referral for second opinion. 03/18/21 - Endocrinology, telemedicine - Increase the Tresiba dose(46 units in AM) as a provisional measure until she can start on the insulin pump  Hospital visits:  None in previous 6 months  Medications: Outpatient Encounter Medications as of 04/20/2021  Medication Sig   acetaminophen (TYLENOL) 500 MG tablet Take 1 tablet (500 mg total) by mouth 3 (three) times daily as needed.   Ascorbic Acid (VITAMIN C) 100 MG tablet Take 100 mg by mouth daily.   BD ULTRA-FINE LANCETS lancets Use as instructed upto 6 times daily   benzonatate (TESSALON) 100 MG capsule Take 1 capsule (100 mg total) by mouth 3 (three) times daily as needed for cough.   Cholecalciferol (VITAMIN D3) 25 MCG (1000 UT) CAPS Take 1 capsule (1,000 Units total) by mouth daily.   clopidogrel (PLAVIX) 75 MG tablet TAKE 1 TABLET(75 MG) BY MOUTH DAILY   Continuous Blood Gluc Transmit (DEXCOM G6 TRANSMITTER) MISC 1 kit by Does not apply route See admin instructions. Use to check blood sugar   diclofenac Sodium (VOLTAREN) 1 % GEL Apply 2 g topically 3 (three) times daily.   glucagon 1 MG injection Inject 1 mg into the muscle once as needed for up to 1 dose.   glucose blood test strip Use as instructed to check glucose 4 times daily.   insulin aspart (NOVOLOG) 100 UNIT/ML injection USE UP TO 50 UNITS DAILY IN THE INSULIN PUMP   insulin degludec (TRESIBA FLEXTOUCH) 100 UNIT/ML FlexTouch Pen Inject 40 Units into the skin daily.   Insulin Pen Needle (PEN NEEDLES) 32G X 5 MM MISC Use 1x a day   losartan (COZAAR) 25 MG tablet Take 1 tablet (25 mg total) by  mouth daily.   metoprolol succinate (TOPROL-XL) 50 MG 24 hr tablet Take 1 tablet (50 mg total) by mouth at bedtime. Take with or immediately following a meal.   nitroGLYCERIN (NITROSTAT) 0.4 MG SL tablet Place 1 tablet (0.4 mg total) under the tongue every 5 (five) minutes as needed for chest pain.   rosuvastatin (CRESTOR) 20 MG tablet Take 1 tablet (20 mg total) by mouth daily.   SYNTHROID 75 MCG tablet TAKE ONE TABLET BY MOUTH monday-saturday before breakfast   venlafaxine (EFFEXOR) 37.5 MG tablet Take 37.5 mg once a day for a week then increase to 37.5 mg twice a day and continue that dose   No facility-administered encounter medications on file as of 04/20/2021.   BP Readings from Last 3 Encounters:  03/16/21 120/64  03/09/21 (!) 142/81  02/12/21 140/68    Lab Results  Component Value Date   HGBA1C 9.7 (A) 12/03/2020     Last adherence delivery date:  04/01/2021      Patient is due for next adherence delivery on: 04/29/2021  Multiple attempts made to reach patient on 04/20/21 04/21/21 . Unsuccessful outreach. Will refill based off of last adherence fill.   This delivery to include: Vials  30 Days  Crestor 20 mg - 1 tablet daily (evening meal) Plavix 75 mg - 1 tablet daily (evening meal) Metoprolol ER 50 mg - 1 tablet daily (bedtime) Synthroid 75 mcg -   1 tablet daily Monday-Saturday except Thursdays (before breakfast) Venlafaxine 37.5 mg - 1 tablet twice daily (breakfast and evening meal) [name] - Inject 46 units daily Novolog - Inject up to 50 units daily in pump Losartan 64m - 1 tablet daily (breakfast) Preservision Areds 2 - 2 capsules daily (breakfast) One Touch Test Strips Pen Needles   Not sending the following medications this month: Emgality - filled 04/15/21 30 DS  No refill request needed from PCP.  Delivery scheduled for 04/29/2021. Unable to speak with patient to confirm date.   MDebbora Dus CPP notified  VAvel Sensor CAugusta Assistant [phone] I have reviewed the care management and care coordination activities outlined in this encounter and I am certifying that I agree with the content of this note. Pt decided to hold off on starting carbamazepine due to drug interactions.  MDebbora Dus PharmD Clinical Pharmacist [phone]

## 2021-04-26 DIAGNOSIS — K5792 Diverticulitis of intestine, part unspecified, without perforation or abscess without bleeding: Secondary | ICD-10-CM

## 2021-04-26 HISTORY — DX: Diverticulitis of intestine, part unspecified, without perforation or abscess without bleeding: K57.92

## 2021-04-30 ENCOUNTER — Telehealth: Payer: Self-pay

## 2021-04-30 NOTE — Chronic Care Management (AMB) (Addendum)
Chronic Care Management Pharmacy Assistant   Name: Cassandra Benson  MRN: [id number] DOB: 05/09/1941   Reason for Encounter: Disease State DM  Recent office visits:  None since last CCM visit  Recent consult visits:  None since last CCM visit  Hospital visits:  None in previous 6 months  Medications: Outpatient Encounter Medications as of 04/30/2021  Medication Sig   acetaminophen (TYLENOL) 500 MG tablet Take 1 tablet (500 mg total) by mouth 3 (three) times daily as needed.   Ascorbic Acid (VITAMIN C) 100 MG tablet Take 100 mg by mouth daily.   BD ULTRA-FINE LANCETS lancets Use as instructed upto 6 times daily   benzonatate (TESSALON) 100 MG capsule Take 1 capsule (100 mg total) by mouth 3 (three) times daily as needed for cough.   Cholecalciferol (VITAMIN D3) 25 MCG (1000 UT) CAPS Take 1 capsule (1,000 Units total) by mouth daily.   clopidogrel (PLAVIX) 75 MG tablet TAKE 1 TABLET(75 MG) BY MOUTH DAILY   Continuous Blood Gluc Transmit (DEXCOM G6 TRANSMITTER) MISC 1 kit by Does not apply route See admin instructions. Use to check blood sugar   diclofenac Sodium (VOLTAREN) 1 % GEL Apply 2 g topically 3 (three) times daily.   glucagon 1 MG injection Inject 1 mg into the muscle once as needed for up to 1 dose.   glucose blood test strip Use as instructed to check glucose 4 times daily.   insulin aspart (NOVOLOG) 100 UNIT/ML injection USE UP TO 50 UNITS DAILY IN THE INSULIN PUMP   insulin degludec (TRESIBA FLEXTOUCH) 100 UNIT/ML FlexTouch Pen Inject 40 Units into the skin daily.   Insulin Pen Needle (PEN NEEDLES) 32G X 5 MM MISC Use 1x a day   losartan (COZAAR) 25 MG tablet Take 1 tablet (25 mg total) by mouth daily.   metoprolol succinate (TOPROL-XL) 50 MG 24 hr tablet Take 1 tablet (50 mg total) by mouth at bedtime. Take with or immediately following a meal.   nitroGLYCERIN (NITROSTAT) 0.4 MG SL tablet Place 1 tablet (0.4 mg total) under the tongue every 5 (five) minutes as needed  for chest pain.   rosuvastatin (CRESTOR) 20 MG tablet Take 1 tablet (20 mg total) by mouth daily.   SYNTHROID 75 MCG tablet TAKE ONE TABLET BY MOUTH monday-saturday before breakfast   venlafaxine (EFFEXOR) 37.5 MG tablet Take 37.5 mg once a day for a week then increase to 37.5 mg twice a day and continue that dose   No facility-administered encounter medications on file as of 04/30/2021.    Recent Relevant Labs: Lab Results  Component Value Date/Time   HGBA1C 9.7 (A) 12/03/2020 01:51 PM   HGBA1C 12.0 (A) 09/04/2020 02:58 PM   HGBA1C 8.4 (H) 01/15/2020 05:01 AM   HGBA1C 9.7 (H) 09/10/2019 07:49 AM   MICROALBUR 0.1 10/23/2014 02:31 PM   MICROALBUR 0.8 07/17/2014 09:49 AM    Kidney Function Lab Results  Component Value Date/Time   CREATININE 0.86 12/04/2020 09:55 PM   CREATININE 0.79 11/19/2020 12:15 PM   CREATININE 0.89 01/11/2016 12:00 AM   GFR 59.00 (L) 09/09/2020 07:40 AM   GFRNONAA >60 12/04/2020 09:55 PM   GFRAA >60 03/05/2020 10:08 PM     Attempted contact with Clearence Ped Cahalan 3 times on 04/30/21,05/04/21,05/05/21. Unsuccessful outreach. Will attempt contact next month.   Current antihyperglycemic regimen:  Tresiba 20-22 units am,28-30 units pm Novolog 15-20 units at bedtime   Adherence Review: Is the patient currently on a STATIN medication? Yes  Is the patient currently on ACE/ARB medication? Yes Does the patient have >5 day gap between last estimated fill dates? No  Care Gaps: Last annual wellness visit:09/16/2020 Last eye exam / retinopathy screening: unknown Last diabetic foot exam:09/16/2020   Star Rating Drugs:  Medication:  Last Fill: Day Supply Tresiba  04/23/21 35 Novolog  04/23/21 30 Rosuvastatin 72m 04/23/21 30 Losartan 243m8/1/22  30   PCP appointment on 06/16/21   MiDebbora DusCPP notified  VeAvel SensorCCJackssistant [phone] have reviewed the care management and care coordination activities outlined in this  encounter and I am certifying that I agree with the content of this note. No further action required.  MiDebbora DusPharmD Clinical Pharmacist [facility] at [phone]

## 2021-05-19 ENCOUNTER — Other Ambulatory Visit: Payer: Self-pay | Admitting: Internal Medicine

## 2021-05-19 DIAGNOSIS — E139 Other specified diabetes mellitus without complications: Secondary | ICD-10-CM

## 2021-05-20 ENCOUNTER — Telehealth: Payer: Self-pay

## 2021-05-20 NOTE — Chronic Care Management (AMB) (Addendum)
Chronic Care Management Pharmacy Assistant   Name: Cassandra Benson  MRN: [id number] DOB: September 05, 1941   Reason for Encounter: Medication Adherence and Delivery Coordination   Recent office visits:  None since last CCM contact  Recent consult visits:  None since last CCM contact  Hospital visits:  None in previous 6 months  Medications: Outpatient Encounter Medications as of 05/20/2021  Medication Sig   acetaminophen (TYLENOL) 500 MG tablet Take 1 tablet (500 mg total) by mouth 3 (three) times daily as needed.   Ascorbic Acid (VITAMIN C) 100 MG tablet Take 100 mg by mouth daily.   BD ULTRA-FINE LANCETS lancets Use as instructed upto 6 times daily   benzonatate (TESSALON) 100 MG capsule Take 1 capsule (100 mg total) by mouth 3 (three) times daily as needed for cough.   Cholecalciferol (VITAMIN D3) 25 MCG (1000 UT) CAPS Take 1 capsule (1,000 Units total) by mouth daily.   clopidogrel (PLAVIX) 75 MG tablet TAKE 1 TABLET(75 MG) BY MOUTH DAILY   Continuous Blood Gluc Transmit (DEXCOM G6 TRANSMITTER) MISC 1 kit by Does not apply route See admin instructions. Use to check blood sugar   diclofenac Sodium (VOLTAREN) 1 % GEL Apply 2 g topically 3 (three) times daily.   glucagon 1 MG injection Inject 1 mg into the muscle once as needed for up to 1 dose.   glucose blood test strip Use as instructed to check glucose 4 times daily.   Insulin Aspart FlexPen (NOVOLOG) 100 UNIT/ML USE UP TO 50 UNITS DAILY in insulin pump   insulin degludec (TRESIBA FLEXTOUCH) 100 UNIT/ML FlexTouch Pen Inject 40 Units into the skin daily.   Insulin Pen Needle (PEN NEEDLES) 32G X 5 MM MISC Use 1x a day   losartan (COZAAR) 25 MG tablet Take 1 tablet (25 mg total) by mouth daily.   metoprolol succinate (TOPROL-XL) 50 MG 24 hr tablet Take 1 tablet (50 mg total) by mouth at bedtime. Take with or immediately following a meal.   nitroGLYCERIN (NITROSTAT) 0.4 MG SL tablet Place 1 tablet (0.4 mg total) under the tongue  every 5 (five) minutes as needed for chest pain.   rosuvastatin (CRESTOR) 20 MG tablet Take 1 tablet (20 mg total) by mouth daily.   SYNTHROID 75 MCG tablet TAKE ONE TABLET BY MOUTH monday-saturday before breakfast   venlafaxine (EFFEXOR) 37.5 MG tablet Take 37.5 mg once a day for a week then increase to 37.5 mg twice a day and continue that dose   No facility-administered encounter medications on file as of 05/20/2021.    BP Readings from Last 3 Encounters:  03/16/21 120/64  03/09/21 (!) 142/81  02/12/21 140/68    Lab Results  Component Value Date   HGBA1C 9.7 (A) 12/03/2020      No OVs, Consults, or hospital visits since last care coordination call / Pharmacist visit.  Last adherence delivery date:04/29/2021      Patient is due for next adherence delivery on: 05/31/2021  Spoke with patient on 05/21/21 reviewed medications and coordinated delivery.  This delivery to include: Vials  30 Days VIAL medications: Crestor 20 mg - 1 tablet daily (evening meal) Plavix 75 mg - 1 tablet daily (evening meal) Metoprolol ER 50 mg - 1 tablet daily (bedtime) Synthroid 75 mcg - 1 tablet daily Monday-Saturday except Thursdays (before breakfast) Venlafaxine 37.5 mg - 1 tablet twice daily (breakfast and evening meal) [name] - Inject 46 units daily Novolog - Inject up to 50 units daily  in pump Losartan 29m - 1 tablet daily (breakfast) One Touch Test Strips Emgality 1276mml syringe  Patient declined the following medications this month: Pen Needles  Preservision Areds 2 - 2 capsules daily (patient has 30 day supply on hand)  Any concerns about your medications? The patient reports she declined  to take carbamazepine 10023mnce a day for a week, then increase to twice daily.  How often do you forget or accidentally miss a dose? Never  Do you use a pillbox? Yes she puts her meds out in 2 pill boxes  No refill request needed from PCP.  Confirmed delivery date of 05/31/21,  advised patient that pharmacy will contact them the morning of delivery.  Recent blood pressure readings are as follows: no readings available   Recent blood glucose readings are as follows:  Fasting:200 Before Meals: 200 Bedtime:200  The patient reports she will be getting a new insulin pump in the future from Dr.Am[place]he patient states her A1c was 6.1 on the insulin pump.  MicDebbora DusPP notified  VelAvel SensorMAMadridsistant [phone] have reviewed the care management and care coordination activities outlined in this encounter and I am certifying that I agree with the content of this note. No further action required.  MicDebbora DusharmD Clinical Pharmacist [facility]im[facility] at [phone]

## 2021-05-22 ENCOUNTER — Other Ambulatory Visit: Payer: Self-pay

## 2021-05-22 ENCOUNTER — Emergency Department
Admission: EM | Admit: 2021-05-22 | Discharge: 2021-05-22 | Disposition: A | Discharge status: Home / Self Care | Payer: HMO | Attending: Emergency Medicine | Admitting: Emergency Medicine

## 2021-05-22 ENCOUNTER — Emergency Department: Payer: HMO

## 2021-05-22 DIAGNOSIS — E039 Hypothyroidism, unspecified: Secondary | ICD-10-CM | POA: Insufficient documentation

## 2021-05-22 DIAGNOSIS — Z96641 Presence of right artificial hip joint: Secondary | ICD-10-CM | POA: Insufficient documentation

## 2021-05-22 DIAGNOSIS — K529 Noninfective gastroenteritis and colitis, unspecified: Secondary | ICD-10-CM | POA: Diagnosis not present

## 2021-05-22 DIAGNOSIS — Z7902 Long term (current) use of antithrombotics/antiplatelets: Secondary | ICD-10-CM | POA: Insufficient documentation

## 2021-05-22 DIAGNOSIS — R5383 Other fatigue: Secondary | ICD-10-CM | POA: Diagnosis not present

## 2021-05-22 DIAGNOSIS — K5732 Diverticulitis of large intestine without perforation or abscess without bleeding: Secondary | ICD-10-CM | POA: Diagnosis not present

## 2021-05-22 DIAGNOSIS — R300 Dysuria: Secondary | ICD-10-CM | POA: Insufficient documentation

## 2021-05-22 DIAGNOSIS — Z20822 Contact with and (suspected) exposure to covid-19: Secondary | ICD-10-CM | POA: Insufficient documentation

## 2021-05-22 DIAGNOSIS — I10 Essential (primary) hypertension: Secondary | ICD-10-CM | POA: Insufficient documentation

## 2021-05-22 DIAGNOSIS — Z794 Long term (current) use of insulin: Secondary | ICD-10-CM | POA: Insufficient documentation

## 2021-05-22 DIAGNOSIS — E109 Type 1 diabetes mellitus without complications: Secondary | ICD-10-CM | POA: Insufficient documentation

## 2021-05-22 DIAGNOSIS — R509 Fever, unspecified: Secondary | ICD-10-CM | POA: Diagnosis not present

## 2021-05-22 DIAGNOSIS — Z79899 Other long term (current) drug therapy: Secondary | ICD-10-CM | POA: Diagnosis not present

## 2021-05-22 DIAGNOSIS — K5792 Diverticulitis of intestine, part unspecified, without perforation or abscess without bleeding: Secondary | ICD-10-CM | POA: Diagnosis not present

## 2021-05-22 DIAGNOSIS — R1030 Lower abdominal pain, unspecified: Secondary | ICD-10-CM | POA: Diagnosis present

## 2021-05-22 LAB — BASIC METABOLIC PANEL
Anion gap: 9 (ref 5–15)
BUN: 14 mg/dL (ref 8–23)
CO2: 27 mmol/L (ref 22–32)
Calcium: 9.3 mg/dL (ref 8.9–10.3)
Chloride: 100 mmol/L (ref 98–111)
Creatinine, Ser: 0.83 mg/dL (ref 0.44–1.00)
GFR, Estimated: >60 mL/min (ref >60)
Glucose, Bld: 308 mg/dL — ABNORMAL HIGH (ref 70–99)
Potassium: 3.6 mmol/L (ref 3.5–5.1)
Sodium: 136 mmol/L (ref 135–145)

## 2021-05-22 LAB — URINALYSIS, COMPLETE (UACMP) WITH MICROSCOPIC
Bacteria, UA: NONE SEEN
Specific Gravity, Urine: 1.016 (ref 1.005–1.030)

## 2021-05-22 LAB — RESP PANEL BY RT-PCR (FLU A&B, COVID) ARPGX2
Influenza A by PCR: NEGATIVE
Influenza B by PCR: NEGATIVE
SARS Coronavirus 2 by RT PCR: NEGATIVE

## 2021-05-22 LAB — CBC
HCT: 40.8 % (ref 36.0–46.0)
Hemoglobin: 13.3 g/dL (ref 12.0–15.0)
MCH: 27.8 pg (ref 26.0–34.0)
MCHC: 32.6 g/dL (ref 30.0–36.0)
MCV: 85.2 fL (ref 80.0–100.0)
Platelets: 217 10*3/uL (ref 150–400)
RBC: 4.79 MIL/uL (ref 3.87–5.11)
RDW: 13.9 % (ref 11.5–15.5)
WBC: 10 10*3/uL (ref 4.0–10.5)
nRBC: 0 % (ref 0.0–0.2)

## 2021-05-22 MED ORDER — CIPROFLOXACIN HCL 500 MG PO TABS: 500.0000 mg | ORAL_TABLET | Freq: Two times a day (BID) | ORAL | 0 refills | Status: AC

## 2021-05-22 MED ORDER — METRONIDAZOLE 500 MG PO TABS: 500.0000 mg | ORAL_TABLET | Freq: Three times a day (TID) | ORAL | 0 refills | Status: DC

## 2021-05-22 MED ORDER — METRONIDAZOLE 500 MG PO TABS: 500.0000 mg | ORAL_TABLET | Freq: Three times a day (TID) | ORAL | 0 refills | Status: AC

## 2021-05-22 MED ORDER — CIPROFLOXACIN HCL 500 MG PO TABS: 500.0000 mg | ORAL_TABLET | Freq: Two times a day (BID) | ORAL | 0 refills | Status: DC

## 2021-05-22 NOTE — Discharge Instructions (Addendum)
Take the antibiotics as prescribed and finish the full 7-day course.  Return to the ER for new, worsening, or persistent severe pain, fever, any vomiting, weakness, blood in stool, or any other new or worsening symptoms that concern you.

## 2021-05-22 NOTE — ED Triage Notes (Signed)
Pt comes pov with dysuria, fever, and frequency. Started yesterday.

## 2021-05-22 NOTE — ED Provider Notes (Signed)
[facility] Emergency Department Provider Note ____________________________________________   Event Date/Time   First MD Initiated Contact with Patient 05/22/21 1948     (approximate)  I have reviewed the triage vital signs and the nursing notes.   HISTORY  Chief Complaint Recurrent UTI    HPI Cassandra Benson is a 80 y.o. female with PMH as noted below who presents with dysuria over the last several days, persistent course, associated with urinary frequency and hesitancy.  The patient has also had some lower abdominal discomfort and noted a fever to over 100 twice over the last few days.  She reports generalized fatigue.  She denies any vomiting, diarrhea, cough, or acute shortness of breath.  She denies any known recent COVID exposures.  The patient started taking an over-the-counter Azo type treatment for possible UTI.  Past Medical History:  Diagnosis Date   Allergy    ANA positive    positive ANA pattern 1 speckled   Arthritis    Carotid stenosis, asymptomatic 06/19/2015   9-73% RICA 53-29% LICA rpt 1 yr (05/2425)    Colon polyps    Dermatomyositis ([place])    Diabetes mellitus without complication ([place])    Type 1   Diverticulosis    sigmoid on CT scan 12/2019   Family history of adverse reaction to anesthesia    brothr went into cardiac arrest from anectine   Fibromyalgia    prior PCP   GERD (gastroesophageal reflux disease)    prior PCP   Glaucoma    Narrow angle   History of blood clots    DVT, in 20s, none since   History of chicken pox    History of diverticulitis    History of pericarditis 1986   with hospitalization   History of pneumonia 2014   History of shingles    History of UTI    Hyperlipidemia    Hypertension    Hypothyroidism    Mixed connective tissue disease ([place])    Partial small bowel obstruction ([place]) 12/2019   managed conservatively   Peptic ulcer    Pneumonia    PONV (postoperative nausea and vomiting)     Raynaud's disease without gangrene    Shoulder pain left   h/o RTC tendonitis and adhesive capsulitis   Sjogren's syndrome ([place])    Sleep apnea    prior PCP - no CPAP for about 10 yrs   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[place])    Vitamin D deficiency    prior PCP    Patient Active Problem List   Diagnosis Date Noted   Breast pain, right 02/13/2021   Unsteadiness 02/13/2021   Atherosclerosis of aorta ([place]) 12/16/2020   Neck pain 12/03/2020   Cause of injury, fall 09/01/2020   Grade II diastolic dysfunction 83/41/9622   Chronic venous insufficiency 04/07/2020   Bowel habit changes 02/18/2020   Diverticulosis    Partial small bowel obstruction ([place]) 01/15/2020   Epistaxis 01/13/2020   Cough 01/05/2020   Cognitive changes 10/22/2019   Right sided abdominal pain 10/07/2019   Skin lesion 09/14/2019   Renal insufficiency 09/14/2019   Pain in right buttock 06/18/2019   Chronic midline thoracic back pain 05/14/2019   Sjogren's syndrome without extraglandular involvement ([place]) 02/26/2019   Epigastric discomfort 12/17/2018   PAD (peripheral artery disease) ([place]) 10/04/2018   Raynaud disease 09/04/2018   Amaurosis fugax, both eyes 06/18/2018   TIA (transient ischemic attack) 05/21/2018   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   Facial paresthesia 02/07/2018  Pain and swelling of lower leg 12/25/2017   Fibromyalgia    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), bilateral 08/21/2017   Intractable episodic headache 03/13/2017   Numbness and tingling of both lower extremities 03/13/2017   6th nerve palsy, 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   Fatty liver 02/13/2017   LADA (latent autoimmune diabetes in adults), 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/03/2016   Medicare annual wellness visit, subsequent 06/19/2015   Health maintenance examination 06/19/2015   Advanced care planning/counseling discussion 06/19/2015   Carotid stenosis, 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 06/19/2015   Vitamin D deficiency    Family history of  premature CAD 06/02/2015   Chest pain 05/26/2015   Elevated testosterone level in female 09/04/2014   Hyperlipidemia associated with type 2 diabetes mellitus ([place]) 07/30/2014   Essential hypertension 07/17/2014   Hypothyroidism due to Hashimoto's thyroiditis 07/17/2014   Adjustment disorder with mixed anxiety and depressed mood 07/17/2014   Apnea, sleep 08/22/2012   LBP (low back pain) 02/27/2012   Positive ANA (antinuclear antibody) 01/06/2012    Past Surgical History:  Procedure Laterality Date   ABDOMINAL HYSTERECTOMY  1978   fibroids and menorrhagia, ovaries remain   ARTERY BIOPSY Right 04/06/2018   Procedure: BIOPSY TEMPORAL ARTERY RIGHT;  Surgeon: Beverly Gust, MD;  Location: [facility];  Service: ENT;  Laterality: Right;  Diabetic - insulin pump sleep apnea   [facility] normal per patient   COLONOSCOPY  10/2011   1 TA, 1 HP, very tortuous colon ([place])   COLONOSCOPY  02/2020   TA, inflammatory polyp, mod diverticulosis, int/ext hemorrhoids ([place]) no rpt recommended    COLONOSCOPY WITH ESOPHAGOGASTRODUODENOSCOPY (EGD)  03/2007   2 ulcers, benign polyp, rpt 5 yrs [facility])   JOINT REPLACEMENT Right    hip   PARTIAL HIP ARTHROPLASTY  2013   Right hip replacement   TONSILLECTOMY     TONSILLECTOMY AND ADENOIDECTOMY     TRANSCAROTID ARTERY REVASCULARIZATION  Left 07/23/2018   Procedure: TRANSCAROTID ARTERY REVASCULARIZATION;  Surgeon: Marty Heck, MD;  Location: [facility];  Service: Vascular;  Laterality: Left;   TUBAL LIGATION     VAGINAL DELIVERY     x2, no complications    Prior to Admission medications   Medication Sig Start Date End Date Taking? Authorizing Provider  acetaminophen (TYLENOL) 500 MG tablet Take 1 tablet (500 mg total) by mouth 3 (three) times daily as needed. 01/03/20   [name], MD  Ascorbic Acid (VITAMIN C) 100 MG tablet Take 100 mg by mouth daily.    [provider]   BD ULTRA-FINE LANCETS lancets Use as instructed upto 6 times daily 09/02/14   Phadke, Radhika P, MD  benzonatate (TESSALON) 100 MG capsule Take 1 capsule (100 mg total) by mouth 3 (three) times daily as needed for cough. 05/20/20   [name], MD  Cholecalciferol (VITAMIN D3) 25 MCG (1000 UT) CAPS Take 1 capsule (1,000 Units total) by mouth daily. 09/16/20   [name], MD  ciprofloxacin (CIPRO) 500 MG tablet Take 1 tablet (500 mg total) by mouth 2 (two) times daily for 7 days. 05/22/21 05/29/21  Arta Silence, MD  clopidogrel (PLAVIX) 75 MG tablet TAKE 1 TABLET(75 MG) BY MOUTH DAILY 09/10/20   Waynetta Sandy, MD  Continuous Blood Gluc Transmit (DEXCOM G6 TRANSMITTER) MISC 1 kit by Does not apply route See admin instructions. Use to check blood sugar 09/18/19   Philemon Kingdom, MD  diclofenac Sodium (VOLTAREN) 1 % GEL  Apply 2 g topically 3 (three) times daily. 03/15/20   [name], MD  glucagon 1 MG injection Inject 1 mg into the muscle once as needed for up to 1 dose. 01/24/20   Philemon Kingdom, MD  glucose blood test strip Use as instructed to check glucose 4 times daily. 10/08/20   Philemon Kingdom, MD  Insulin Aspart FlexPen (NOVOLOG) 100 UNIT/ML USE UP TO 50 UNITS DAILY in insulin pump 05/20/21   Philemon Kingdom, MD  insulin degludec (TRESIBA FLEXTOUCH) 100 UNIT/ML FlexTouch Pen Inject 40 Units into the skin daily. 02/22/21   Philemon Kingdom, MD  Insulin Pen Needle (PEN NEEDLES) 32G X 5 MM MISC Use 1x a day 12/03/20   Philemon Kingdom, MD  losartan (COZAAR) 25 MG tablet Take 1 tablet (25 mg total) by mouth daily. 09/16/20   [name], MD  metoprolol succinate (TOPROL-XL) 50 MG 24 hr tablet Take 1 tablet (50 mg total) by mouth at bedtime. Take with or immediately following a meal. 10/29/20   [name], MD  metroNIDAZOLE (FLAGYL) 500 MG tablet Take 1 tablet (500 mg total) by mouth 3 (three) times daily for 7 days. 05/22/21 05/29/21  Arta Silence, MD  nitroGLYCERIN (NITROSTAT) 0.4 MG SL tablet Place 1 tablet (0.4 mg total) under the tongue every 5 (five) minutes as needed for chest pain. 02/05/21   Minna Merritts, MD  rosuvastatin (CRESTOR) 20 MG tablet Take 1 tablet (20 mg total) by mouth daily. 09/16/20   [name], MD  SYNTHROID 75 MCG tablet TAKE ONE TABLET BY MOUTH monday-saturday before breakfast 03/24/21   [name], MD  venlafaxine [facility]) 37.5 MG tablet Take 37.5 mg once a day for a week then increase to 37.5 mg twice a day and continue that dose 09/30/20   [provider]    Allergies Iodinated diagnostic agents, Penicillins, Amlodipine, Anectine [succinylcholine], Codeine, Gabapentin, Influenza vaccines, Nortriptyline, Pamelor [nortriptyline hcl], Valsartan, Zetia [ezetimibe], Erythromycin, and Sulfa antibiotics  Family History  Problem Relation Age of Onset   CAD Mother 54       MI, aortic valve issues   COPD Mother    Lupus Mother    [name] Mother    Rheum arthritis Mother    CAD Father 33       CABG x2, aortic valve replacement   Stroke Sister    CAD Sister    Anuerysm Sister        brain   Lupus Sister    Diabetes Sister    Alcohol abuse Brother    CAD Brother 107       MI   Stroke Brother    Seizures Son    COPD Brother        agent orange   CAD Brother 66       stent   Diabetes Brother    Depression Grandchild    Breast cancer Maternal Aunt    Diabetes Sister    Breast cancer Sister    Breast cancer Maternal Aunt    Stroke Maternal Grandmother    Hypertension Maternal Grandmother    Gallbladder disease Maternal Grandmother    Colon cancer Neg Hx    Esophageal cancer Neg Hx    Rectal cancer Neg Hx    Stomach cancer Neg Hx     Social History Social History   Tobacco Use   Smoking status: Never   Smokeless tobacco: Never  Vaping Use   Vaping Use: Never used  Substance Use Topics  Alcohol use: No   Drug use: No    Review of  Systems  Constitutional: Positive for intermittent fever. Eyes: No redness. ENT: No congestion. Cardiovascular: Denies chest pain. Respiratory: Denies shortness of breath. Gastrointestinal: No vomiting or diarrhea.  Genitourinary: Positive for dysuria.  No vaginal discharge. Musculoskeletal: Negative for back pain. Skin: Negative for rash. Neurological: Negative for headache.   ____________________________________________   PHYSICAL EXAM:  VITAL SIGNS: ED Triage Vitals  Enc Vitals Group     BP 05/22/21 1620 (!) 133/93     Pulse Rate 05/22/21 1620 82     Resp 05/22/21 1620 18     Temp 05/22/21 1620 99 F (37.2 C)     Temp Source 05/22/21 1620 Oral     SpO2 05/22/21 1620 99 %     Weight 05/22/21 1621 160 lb (72.6 kg)     Height 05/22/21 1621 5' 3" (1.6 m)     Head Circumference --      Peak Flow --      Pain Score 05/22/21 1621 8     Pain Loc --      Pain Edu? --      Excl. in [place]? --     Constitutional: Alert and oriented. Well appearing and in no acute distress. Eyes: Conjunctivae are normal.  Head: Atraumatic. Nose: No congestion/rhinnorhea. Mouth/Throat: Mucous membranes are moist.   Neck: Normal range of motion.  Cardiovascular: Normal rate, regular rhythm. Good peripheral circulation. Respiratory: Normal respiratory effort.  No retractions.  With Gastrointestinal: Soft with mild bilateral lower quadrant tenderness.  No distention.  Genitourinary: No CVA tenderness. Musculoskeletal: No lower extremity edema.  Extremities warm and well perfused.  Neurologic:  Normal speech and language. No gross focal neurologic deficits are appreciated.  Skin:  Skin is warm and dry. No rash noted. Psychiatric: Mood and affect are normal. Speech and behavior are normal.  ____________________________________________   LABS (all labs ordered are listed, but only abnormal results are displayed)  Labs Reviewed  BASIC METABOLIC PANEL - Abnormal; Notable for the following  components:      Result Value   Glucose, Bld 308 (*)    All other components within normal limits  URINALYSIS, COMPLETE (UACMP) WITH MICROSCOPIC - Abnormal; Notable for the following components:   Color, Urine ORANGE (*)    APPearance HAZY (*)    Glucose, UA   (*)    Value: TEST NOT REPORTED DUE TO COLOR INTERFERENCE OF URINE PIGMENT   Hgb urine dipstick   (*)    Value: TEST NOT REPORTED DUE TO COLOR INTERFERENCE OF URINE PIGMENT   Bilirubin Urine   (*)    Value: TEST NOT REPORTED DUE TO COLOR INTERFERENCE OF URINE PIGMENT   Ketones, ur   (*)    Value: TEST NOT REPORTED DUE TO COLOR INTERFERENCE OF URINE PIGMENT   Protein, ur   (*)    Value: TEST NOT REPORTED DUE TO COLOR INTERFERENCE OF URINE PIGMENT   Nitrite   (*)    Value: TEST NOT REPORTED DUE TO COLOR INTERFERENCE OF URINE PIGMENT   Leukocytes,Ua   (*)    Value: TEST NOT REPORTED DUE TO COLOR INTERFERENCE OF URINE PIGMENT   All other components within normal limits  RESP PANEL BY RT-PCR (FLU A&B, COVID) ARPGX2  CBC   ____________________________________________  EKG   ____________________________________________  RADIOLOGY  CT abdomen pelvis: Mild rectosigmoid diverticulitis with no abscess or perforation  ____________________________________________   PROCEDURES  Procedure(s) performed: No  Procedures  Critical Care performed: No ____________________________________________   INITIAL IMPRESSION / ASSESSMENT AND PLAN / ED COURSE  Pertinent labs & imaging results that were available during my care of the patient were reviewed by me and considered in my medical decision making (see chart for details).   80 year old female with PMH as noted above presents with dysuria and frequency over the last several days as well as a fever noted at home and some fatigue.  She is concerned she has a UTI.  On exam the patient is overall well-appearing.  Her vital signs are normal.  The physical exam is unremarkable except  for mild bilateral lower quadrant abdominal tenderness.  Initial lab work-up is unremarkable, but the urinalysis does show some WBCs.  Some of the test is not reportable due to the effect of the over-the-counter medication the patient is taking.  Differential includes UTI including cystitis or possible mild pyonephritis, ureteral stone, diverticulitis, colitis, COVID-19 or other viral syndrome.  We will obtain a COVID swab, CT abdomen/pelvis, and reassess.  ----------------------------------------- 10:52 PM on 05/22/2021 -----------------------------------------  COVID swab is negative.  The CT shows mild rectosigmoid diverticulitis which is consistent with the patient's symptoms.  She is stable for outpatient treatment and wants to go home I counseled her on the results of the work-up and the plan of care.  Return precautions given, and she expresses understanding.  ____________________________________________   FINAL CLINICAL IMPRESSION(S) / ED DIAGNOSES  Final diagnoses:  Diverticulitis      NEW MEDICATIONS STARTED DURING THIS VISIT:  Current Discharge Medication List     START taking these medications   Details  ciprofloxacin (CIPRO) 500 MG tablet Take 1 tablet (500 mg total) by mouth 2 (two) times daily for 7 days. Qty: 14 tablet, Refills: 0    metroNIDAZOLE (FLAGYL) 500 MG tablet Take 1 tablet (500 mg total) by mouth 3 (three) times daily for 7 days. Qty: 21 tablet, Refills: 0         Note:  This document was prepared using Dragon voice recognition software and may include unintentional dictation errors.    Arta Silence, MD 05/22/21 2253

## 2021-05-22 NOTE — ED Notes (Signed)
Called daughter per pt request/permission

## 2021-05-24 ENCOUNTER — Telehealth: Payer: Self-pay | Admitting: *Deleted

## 2021-05-24 ENCOUNTER — Encounter: Payer: Self-pay | Admitting: *Deleted

## 2021-05-24 NOTE — Telephone Encounter (Signed)
Pt called to schedule a ED follow up, she is scheduled for 8/31. Pt did state that she is still in a lot of pain

## 2021-05-24 NOTE — Telephone Encounter (Signed)
PLEASE NOTE: All timestamps contained within this report are represented as [place] Standard Time. CONFIDENTIALTY NOTICE: This fax transmission is intended only for the addressee. It contains information that is legally privileged, confidential or otherwise protected from use or disclosure. If you are not the intended recipient, you are strictly prohibited from reviewing, disclosing, copying using or disseminating any of this information or taking any action in reliance on or regarding this information. If you have received this fax in error, please notify us immediately by telephone so that we can arrange for its return to [place]. Phone: [phone], Toll-Free: [phone], Fax: [phone] Page: 1 of 2 Call Id: [id number] [place] RECORD AccessNurse Patient Name: Cassandra Benson Gender: Female DOB: 1941-06-11 Age: 80 Y 10 M 25 D Return Phone Number: [phone] (Primary), [phone] (Secondary) Address[place]  [address] Client [place] Night - Client Client Site [place] Physician [name] - MD Contact Type Call Who Is Calling Patient / Member / Family / Caregiver Call Type Triage / Clinical Relationship To Patient Self Return Phone Number [phone] (Secondary) Chief Complaint Urination Pain Reason for Call Symptomatic / Request for [place] states she thinks she has a UTI. She wants to know if she can see him this week. She had a fever yesterday, but has not run one today. Her sx are burning when urinating and frequent urination. Translation No Nurse Assessment Nurse: Jerene Bears, RN, Will Date/Time Eilene Ghazi Time): 05/22/2021 1:03:38 PM Confirm and document reason for call. If symptomatic, describe symptoms. ---Caller reports fever yesterday, none today. Urinary frequency and burning with  urination x2 days. Does the patient have any new or worsening symptoms? ---Yes Will a triage be completed? ---Yes Related visit to physician within the last 2 weeks? ---No Does the PT have any chronic conditions? (i.e. diabetes, asthma, this includes High risk factors for pregnancy, etc.) ---Yes List chronic conditions. ---dm, chronic venous insufficiency, chronic microvascular ischemic disease, hx of tia Is this a behavioral health or substance abuse call? ---No Guidelines Guideline Title Affirmed Question Affirmed Notes Nurse Date/Time (Eastern Time) Flank Pain [1] Abdominal pain AND [2] age > 28 years Jerene Bears, Therapist, sports, Will 05/22/2021 1:09:47 PM Disp. Time Eilene Ghazi Time) Disposition Final User 05/22/2021 1:14:33 PM Go to ED Now Yes Jerene Bears, RN, Will PLEASE NOTE: All timestamps contained within this report are represented as [place] Standard Time. CONFIDENTIALTY NOTICE: This fax transmission is intended only for the addressee. It contains information that is legally privileged, confidential or otherwise protected from use or disclosure. If you are not the intended recipient, you are strictly prohibited from reviewing, disclosing, copying using or disseminating any of this information or taking any action in reliance on or regarding this information. If you have received this fax in error, please notify us immediately by telephone so that we can arrange for its return to [place]. Phone: [phone], Toll-Free: [phone], Fax: [phone] Page: 2 of 2 Call Id: [id number] [place] Disagree/Comply Comply Caller Understands Yes PreDisposition Did not know what to do Care Advice Given Per Guideline GO TO ED NOW: * You need to be seen in the Emergency Department. * Go to the ED at ___________ [place] now. Drive carefully. CARE ADVICE given per Flank Pain (Adult) guideline. Comments User: Janeece Agee, RN Date/Time Eilene Ghazi Time): 05/22/2021 1:09:46 PM right sided flank pain x2-3  days. Referrals [facility]

## 2021-05-24 NOTE — Telephone Encounter (Signed)
Patient seen at Urgent Care.

## 2021-05-24 NOTE — Telephone Encounter (Signed)
Seen at [facility] ER, dx diverticulitis, treated with cipro/flagyl. Plz call to ensure symptoms improving with treatment.

## 2021-05-26 ENCOUNTER — Encounter: Payer: Self-pay | Admitting: Family Medicine

## 2021-05-26 ENCOUNTER — Other Ambulatory Visit: Payer: Self-pay

## 2021-05-26 ENCOUNTER — Ambulatory Visit (INDEPENDENT_AMBULATORY_CARE_PROVIDER_SITE_OTHER): Payer: HMO | Admitting: Family Medicine

## 2021-05-26 DIAGNOSIS — K5732 Diverticulitis of large intestine without perforation or abscess without bleeding: Secondary | ICD-10-CM | POA: Diagnosis not present

## 2021-05-26 HISTORY — DX: Diverticulitis of large intestine without perforation or abscess without bleeding: K57.32

## 2021-05-26 NOTE — Assessment & Plan Note (Addendum)
Reviewed ER records and anticipated course of recovery.  Finish abx course. Discussed bowel rest over the next week including clear liquid diet for 24-48 hours (monitoring glycemic control) then transition to bland diet.  Reviewed high fiber diet to prevent recurrence.  Diverticulitis handout provided.

## 2021-05-26 NOTE — Progress Notes (Signed)
Patient ID: Cassandra Benson, female    DOB: [birth year], 80 y.o.   MRN: [id number]  This visit was conducted in person.  BP 134/62   Pulse 87   Temp (!) 97.5 F (36.4 C) (Temporal)   Ht _0  (1.6 m)   Wt 159 lb (72.1 kg)   SpO2 98%   BMI 28.17 kg/m    CC: ER f/u visit  Subjective:   HPI: Cassandra Benson is a 80 y.o. female presenting on 05/26/2021 for Hospitalization Follow-up (Seen on 05/22/21 at [name] ED, dx diverticulitis. )   Recent evaluation at [facility] ER for lower abdominal pain associated with dysuria, frequency and hesitancy. Low grade fever and generalized fatigue. Had treated at home with azo. Had 1 wk h/o symptoms prior to seeking care. CT abdomen/pelvis returned showing mild rectosigmoid diverticulitis without complicating feature. Tested negative for COVID. Treated with cipro and flagyl 7d course. Urine culture not obtained. Cbg 308, Cr 0.83, WBC 10.0. All records reviewed. CT with fatty liver.   Notes ongoing R lower abdominal discomfort.   She did develop rash to bilateral forearms but that has since resolved.      Relevant past medical, surgical, family and social history reviewed and updated as indicated. Interim medical history since our last visit reviewed. Allergies and medications reviewed and updated. Outpatient Medications Prior to Visit  Medication Sig Dispense Refill   acetaminophen (TYLENOL) 500 MG tablet Take 1 tablet (500 mg total) by mouth 3 (three) times daily as needed.     Ascorbic Acid (VITAMIN C) 100 MG tablet Take 100 mg by mouth daily.     BD ULTRA-FINE LANCETS lancets Use as instructed upto 6 times daily 600 each 2   benzonatate (TESSALON) 100 MG capsule Take 1 capsule (100 mg total) by mouth 3 (three) times daily as needed for cough. 30 capsule 3   Cholecalciferol (VITAMIN D3) 25 MCG (1000 UT) CAPS Take 1 capsule (1,000 Units total) by mouth daily. 30 capsule    ciprofloxacin (CIPRO) 500 MG tablet Take 1 tablet (500 mg total) by  mouth 2 (two) times daily for 7 days. 14 tablet 0   clopidogrel (PLAVIX) 75 MG tablet TAKE 1 TABLET(75 MG) BY MOUTH DAILY 30 tablet 6   Continuous Blood Gluc Transmit (DEXCOM G6 TRANSMITTER) MISC 1 kit by Does not apply route See admin instructions. Use to check blood sugar 1 each 1   diclofenac Sodium (VOLTAREN) 1 % GEL Apply 2 g topically 3 (three) times daily. 100 g 3   glucagon 1 MG injection Inject 1 mg into the muscle once as needed for up to 1 dose. 1 each 12   glucose blood test strip Use as instructed to check glucose 4 times daily. 400 each 3   Insulin Aspart FlexPen (NOVOLOG) 100 UNIT/ML USE UP TO 50 UNITS DAILY in insulin pump 50 mL 0   insulin degludec (TRESIBA FLEXTOUCH) 100 UNIT/ML FlexTouch Pen Inject 40 Units into the skin daily. 36 mL 0   Insulin Pen Needle (PEN NEEDLES) 32G X 5 MM MISC Use 1x a day 100 each 3   losartan (COZAAR) 25 MG tablet Take 1 tablet (25 mg total) by mouth daily. 90 tablet 3   metoprolol succinate (TOPROL-XL) 50 MG 24 hr tablet Take 1 tablet (50 mg total) by mouth at bedtime. Take with or immediately following a meal. 90 tablet 1   metroNIDAZOLE (FLAGYL) 500 MG tablet Take 1 tablet (500 mg total) by mouth 3 (three)  times daily for 7 days. 21 tablet 0   nitroGLYCERIN (NITROSTAT) 0.4 MG SL tablet Place 1 tablet (0.4 mg total) under the tongue every 5 (five) minutes as needed for chest pain. 25 tablet 1   rosuvastatin (CRESTOR) 20 MG tablet Take 1 tablet (20 mg total) by mouth daily. 90 tablet 3   SYNTHROID 75 MCG tablet TAKE ONE TABLET BY MOUTH monday-saturday before breakfast 80 tablet 1   venlafaxine (EFFEXOR) 37.5 MG tablet Take 37.5 mg once a day for a week then increase to 37.5 mg twice a day and continue that dose     No facility-administered medications prior to visit.     Per HPI unless specifically indicated in ROS section below Review of Systems  Objective:  BP 134/62   Pulse 87   Temp (!) 97.5 F (36.4 C) (Temporal)   Ht _0  (1.6 m)    Wt 159 lb (72.1 kg)   SpO2 98%   BMI 28.17 kg/m   Wt Readings from Last 3 Encounters:  05/26/21 159 lb (72.1 kg)  05/22/21 160 lb (72.6 kg)  03/16/21 159 lb 7 oz (72.3 kg)      Physical Exam Vitals and nursing note reviewed.  Constitutional:      Appearance: Normal appearance. She is not ill-appearing.  Cardiovascular:     Rate and Rhythm: Normal rate and regular rhythm.     Pulses: Normal pulses.     Heart sounds: Normal heart sounds. No murmur heard. Pulmonary:     Effort: Pulmonary effort is normal. No respiratory distress.     Breath sounds: Normal breath sounds. No wheezing, rhonchi or rales.  Abdominal:     General: Bowel sounds are normal. There is no distension.     Palpations: Abdomen is soft. There is no mass.     Tenderness: There is abdominal tenderness (mild-mod) in the right lower quadrant, suprapubic area and left lower quadrant. There is no guarding or rebound.     Hernia: No hernia is present.  Skin:    General: Skin is warm and dry.     Findings: No rash.  Neurological:     Mental Status: She is alert.  Psychiatric:        Mood and Affect: Mood normal.        Behavior: Behavior normal.      Results for orders placed or performed during the hospital encounter of 05/22/21  Resp Panel by RT-PCR (Flu A&B, Covid) Nasopharyngeal Swab   Specimen: Nasopharyngeal Swab; Nasopharyngeal(NP) swabs in vial transport medium  Result Value Ref Range   SARS Coronavirus 2 by RT PCR NEGATIVE NEGATIVE   Influenza A by PCR NEGATIVE NEGATIVE   Influenza B by PCR NEGATIVE NEGATIVE  CBC  Result Value Ref Range   WBC 10.0 4.0 - 10.5 K/uL   RBC 4.79 3.87 - 5.11 MIL/uL   Hemoglobin 13.3 12.0 - 15.0 g/dL   HCT 40.8 36.0 - 46.0 %   MCV 85.2 80.0 - 100.0 fL   MCH 27.8 26.0 - 34.0 pg   MCHC 32.6 30.0 - 36.0 g/dL   RDW 13.9 11.5 - 15.5 %   Platelets 217 150 - 400 K/uL   nRBC 0.0 0.0 - 0.2 %  Basic metabolic panel  Result Value Ref Range   Sodium 136 135 - 145 mmol/L    Potassium 3.6 3.5 - 5.1 mmol/L   Chloride 100 98 - 111 mmol/L   CO2 27 22 - 32 mmol/L   Glucose,  Bld 308 (H) 70 - 99 mg/dL   BUN 14 8 - 23 mg/dL   Creatinine, Ser 0.83 0.44 - 1.00 mg/dL   Calcium 9.3 8.9 - 10.3 mg/dL   GFR, Estimated >60 >60 mL/min   Anion gap 9 5 - 15  Urinalysis, Complete w Microscopic Urine, Clean Catch  Result Value Ref Range   Color, Urine ORANGE (A) YELLOW   APPearance HAZY (A) CLEAR   Specific Gravity, Urine 1.016 1.005 - 1.030   pH  5.0 - 8.0    TEST NOT REPORTED DUE TO COLOR INTERFERENCE OF URINE PIGMENT   Glucose, UA (A) NEGATIVE mg/dL    TEST NOT REPORTED DUE TO COLOR INTERFERENCE OF URINE PIGMENT   Hgb urine dipstick (A) NEGATIVE    TEST NOT REPORTED DUE TO COLOR INTERFERENCE OF URINE PIGMENT   Bilirubin Urine (A) NEGATIVE    TEST NOT REPORTED DUE TO COLOR INTERFERENCE OF URINE PIGMENT   Ketones, ur (A) NEGATIVE mg/dL    TEST NOT REPORTED DUE TO COLOR INTERFERENCE OF URINE PIGMENT   Protein, ur (A) NEGATIVE mg/dL    TEST NOT REPORTED DUE TO COLOR INTERFERENCE OF URINE PIGMENT   Nitrite (A) NEGATIVE    TEST NOT REPORTED DUE TO COLOR INTERFERENCE OF URINE PIGMENT   Leukocytes,Ua (A) NEGATIVE    TEST NOT REPORTED DUE TO COLOR INTERFERENCE OF URINE PIGMENT   RBC / HPF 0-5 0 - 5 RBC/hpf   WBC, UA 6-10 0 - 5 WBC/hpf   Bacteria, UA NONE SEEN NONE SEEN   Squamous Epithelial / LPF 6-10 0 - 5   Mucus PRESENT     Assessment & Plan:  This visit occurred during the SARS-CoV-2 public health emergency.  Safety protocols were in place, including screening questions prior to the visit, additional usage of staff PPE, and extensive cleaning of exam room while observing appropriate contact time as indicated for disinfecting solutions.   Problem List Items Addressed This Visit     Sigmoid diverticulitis    Reviewed ER records and anticipated course of recovery.  Finish abx course. Discussed bowel rest over the next week including clear liquid diet for 24-48 hours  (monitoring glycemic control) then transition to bland diet.  Reviewed high fiber diet to prevent recurrence.  Diverticulitis handout provided.         No orders of the defined types were placed in this encounter.  No orders of the defined types were placed in this encounter.   Patient instructions: For diverticulitis flare, change to clear liquid diet for 24-48 hours then bland food until abdominal pain has resolved.  Finish antibiotic course.  Once diverticulitis flare has resolved, try to proceed with high fiber diet rich in fruits, vegetables, whole grains.   Follow up plan: Return if symptoms worsen or fail to improve.  [name], MD

## 2021-05-26 NOTE — Patient Instructions (Addendum)
For diverticulitis flare, change to clear liquid diet for 24-48 hours then bland food until abdominal pain has resolved.  Finish antibiotic course.  Once diverticulitis flare has resolved, try to proceed with high fiber diet rich in fruits, vegetables, whole grains.   Diverticulitis Diverticulitis is infection or inflammation of small pouches (diverticula) in the colon that form due to a condition called diverticulosis. Diverticula can trap stool (feces) and bacteria, causing infection and inflammation. Diverticulitis may cause severe stomach pain and diarrhea. It may lead to tissue damage in the colon that causes bleeding or blockage. The diverticula may also burst (rupture) and cause infected stool to enter other areas of the abdomen. What are the causes? This condition is caused by stool becoming trapped in the diverticula, which allows bacteria to grow in the diverticula. This leads to inflammation and infection. What increases the risk? You are more likely to develop this condition if you have diverticulosis. The risk increases if you: Are overweight or obese. Do not get enough exercise. Drink alcohol. Use tobacco products. Eat a diet that has a lot of red meat such as beef, pork, or lamb. Eat a diet that does not include enough fiber. High-fiber foods include fruits, vegetables, beans, nuts, and whole grains. Are over 56 years of age. What are the signs or symptoms? Symptoms of this condition may include: Pain and tenderness in the abdomen. The pain is normally located on the left side of the abdomen, but it may occur in other areas. Fever and chills. Nausea. Vomiting. Cramping. Bloating. Changes in bowel routines. Blood in your stool. How is this diagnosed? This condition is diagnosed based on: Your medical history. A physical exam. Tests to make sure there is nothing else causing your condition. These tests may include: Blood tests. Urine tests. CT scan of the abdomen. How  is this treated? Most cases of this condition are mild and can be treated at home. Treatment may include: Taking over-the-counter pain medicines. Following a clear liquid diet. Taking antibiotic medicines by mouth. Resting. More severe cases may need to be treated at a hospital. Treatment may include: Not eating or drinking. Taking prescription pain medicine. Receiving antibiotic medicines through an IV. Receiving fluids and nutrition through an IV. Surgery. When your condition is under control, your health care provider may recommend that you have a colonoscopy. This is an exam to look at the entire large intestine. During the exam, a lubricated, bendable tube is inserted into the anus and then passed into the rectum, colon, and other parts of the large intestine. A colonoscopy can show how severe your diverticula are and whether something else may be causing your symptoms. Follow these instructions at home: Medicines Take over-the-counter and prescription medicines only as told by your health care provider. These include fiber supplements, probiotics, and stool softeners. If you were prescribed an antibiotic medicine, take it as told by your health care provider. Do not stop taking the antibiotic even if you start to feel better. Ask your health care provider if the medicine prescribed to you requires you to avoid driving or using machinery. Eating and drinking  Follow a full liquid diet or another diet as directed by your health care provider. After your symptoms improve, your health care provider may tell you to change your diet. He or she may recommend that you eat a diet that contains at least 25 grams (25 g) of fiber daily. Fiber makes it easier to pass stool. Healthy sources of fiber include: Berries. One  cup contains 4-8 grams of fiber. Beans or lentils. One-half cup contains 5-8 grams of fiber. Green vegetables. One cup contains 4 grams of fiber. Avoid eating red meat. General  instructions Do not use any products that contain nicotine or tobacco, such as cigarettes, e-cigarettes, and chewing tobacco. If you need help quitting, ask your health care provider. Exercise for at least 30 minutes, 3 times each week. You should exercise hard enough to raise your heart rate and break a sweat. Keep all follow-up visits as told by your health care provider. This is important. You may need to have a colonoscopy. Contact a health care provider if: Your pain does not improve. Your bowel movements do not return to normal. Get help right away if: Your pain gets worse. Your symptoms do not get better with treatment. Your symptoms suddenly get worse. You have a fever. You vomit more than one time. You have stools that are bloody, black, or tarry. Summary Diverticulitis is infection or inflammation of small pouches (diverticula) in the colon that form due to a condition called diverticulosis. Diverticula can trap stool (feces) and bacteria, causing infection and inflammation. You are at higher risk for this condition if you have diverticulosis and you eat a diet that does not include enough fiber. Most cases of this condition are mild and can be treated at home. More severe cases may need to be treated at a hospital. When your condition is under control, your health care provider may recommend that you have an exam called a colonoscopy. This exam can show how severe your diverticula are and whether something else may be causing your symptoms. Keep all follow-up visits as told by your health care provider. This is important. This information is not intended to replace advice given to you by your health care provider. Make sure you discuss any questions you have with your health care provider. Document Revised: 06/24/2019 Document Reviewed: 06/24/2019 Elsevier Patient Education  2022 [employer].

## 2021-06-01 ENCOUNTER — Encounter: Payer: Self-pay | Admitting: Family Medicine

## 2021-06-01 DIAGNOSIS — K921 Melena: Secondary | ICD-10-CM

## 2021-06-04 ENCOUNTER — Telehealth: Payer: Self-pay

## 2021-06-04 NOTE — Chronic Care Management (AMB) (Addendum)
Chronic Care Management Pharmacy Assistant   Name: Cassandra Benson  MRN: [id number] DOB: Feb 05, 1941  Reason for Encounter: Diabetes Review   Recent office visits:  05/26/21-PCP-Patient presented for follow up from hospital visit for diverticuitis. Discussed clear liquid diet 24-48 hours,transition to bland diet.No medication changes  Recent consult visits:  05/22/21-[facility] ED-Patient presented for dysuria,abdominal pain,low grade fever.Labs ordered-UA some [place] ordered,started on Cipro 548m  take 1 tablet 2 times daily X 7 days,Metronidazole 5026mtake 1 tablet 3 times daily for 7 days.-no admission.  Hospital visits:  05/22/21-[facility] ED-Patient presented for dysuria,abdominal pain,low grade fever.[place]started on Cipro 50057mtake 1 tablet 2 times daily X 7 days,Metronidazole 500m47mke 1 tablet 3 times daily for 7 days.-no admission.  Medications: Outpatient Encounter Medications as of 06/04/2021  Medication Sig   acetaminophen (TYLENOL) 500 MG tablet Take 1 tablet (500 mg total) by mouth 3 (three) times daily as needed.   Ascorbic Acid (VITAMIN C) 100 MG tablet Take 100 mg by mouth daily.   BD ULTRA-FINE LANCETS lancets Use as instructed upto 6 times daily   benzonatate (TESSALON) 100 MG capsule Take 1 capsule (100 mg total) by mouth 3 (three) times daily as needed for cough.   Cholecalciferol (VITAMIN D3) 25 MCG (1000 UT) CAPS Take 1 capsule (1,000 Units total) by mouth daily.   clopidogrel (PLAVIX) 75 MG tablet TAKE 1 TABLET(75 MG) BY MOUTH DAILY   Continuous Blood Gluc Transmit (DEXCOM G6 TRANSMITTER) MISC 1 kit by Does not apply route See admin instructions. Use to check blood sugar   diclofenac Sodium (VOLTAREN) 1 % GEL Apply 2 g topically 3 (three) times daily.   glucagon 1 MG injection Inject 1 mg into the muscle once as needed for up to 1 dose.   glucose blood test strip Use as instructed to check glucose 4 times daily.   Insulin  Aspart FlexPen (NOVOLOG) 100 UNIT/ML USE UP TO 50 UNITS DAILY in insulin pump   insulin degludec (TRESIBA FLEXTOUCH) 100 UNIT/ML FlexTouch Pen Inject 40 Units into the skin daily.   Insulin Pen Needle (PEN NEEDLES) 32G X 5 MM MISC Use 1x a day   losartan (COZAAR) 25 MG tablet Take 1 tablet (25 mg total) by mouth daily.   metoprolol succinate (TOPROL-XL) 50 MG 24 hr tablet Take 1 tablet (50 mg total) by mouth at bedtime. Take with or immediately following a meal.   nitroGLYCERIN (NITROSTAT) 0.4 MG SL tablet Place 1 tablet (0.4 mg total) under the tongue every 5 (five) minutes as needed for chest pain.   rosuvastatin (CRESTOR) 20 MG tablet Take 1 tablet (20 mg total) by mouth daily.   SYNTHROID 75 MCG tablet TAKE ONE TABLET BY MOUTH monday-saturday before breakfast   venlafaxine (EFFEXOR) 37.5 MG tablet Take 37.5 mg once a day for a week then increase to 37.5 mg twice a day and continue that dose   No facility-administered encounter medications on file as of 06/04/2021.     Recent Relevant Labs: Lab Results  Component Value Date/Time   HGBA1C 9.7 (A) 12/03/2020 01:51 PM   HGBA1C 12.0 (A) 09/04/2020 02:58 PM   HGBA1C 8.4 (H) 01/15/2020 05:01 AM   HGBA1C 9.7 (H) 09/10/2019 07:49 AM   MICROALBUR 0.1 10/23/2014 02:31 PM   MICROALBUR 0.8 07/17/2014 09:49 AM    Kidney Function Lab Results  Component Value Date/Time   CREATININE 0.83 05/22/2021 04:23 PM   CREATININE 0.86 12/04/2020 09:55 PM   CREATININE  0.89 01/11/2016 12:00 AM   GFR 59.00 (L) 09/09/2020 07:40 AM   GFRNONAA >60 05/22/2021 04:23 PM   GFRAA >60 03/05/2020 10:08 PM     Attempted contact with Ebony Hail Tiger 3 times on 06/07/21, 06/09/21, 06/11/21 . Unsuccessful outreach. Will attempt contact next month.   Current antihyperglycemic regimen:  Tresiba 20-22 units am,28-30 units pm Novolog 15-20 units at bedtime  Have there been any recent hospitalizations or ED visits since last visit with CPP? Yes 05/22/21-[facility]  ED-Patient presented for dysuria,abdominal pain,low grade fever.Labs ordered-UA some [place] ordered,started on Cipro 526m  take 1 tablet 2 times daily X 7 days,Metronidazole 5033mtake 1 tablet 3 times daily for 7 days.-no admission.   Adherence Review: Is the patient currently on a STATIN medication? Yes Is the patient currently on ACE/ARB medication? Yes Does the patient have >5 day gap between last estimated fill dates? No  Care Gaps: Annual wellness visit in last year? Yes 09/16/20 Most recent A1C reading: 9.7% (12/03/20) Most Recent BP reading: 134/62 87-P (05/26/21)  Last eye exam / retinopathy screening :10/2020 Last diabetic foot exam: 09/16/20  Star Rating Drugs:  Medication:  Last Fill: Day Supply Tresiba                        05/26/21            34 Novolog                       05/26/21            30 Rosuvastatin 2041m   05/26/21            30 Losartan 55m59m         05/26/21            30  No appointments scheduled within the next 30 days.  MichDebbora DusP notified  VelmAvel SensorMASwiftistant [phone] reviewed the care management and care coordination activities outlined in this encounter and I am certifying that I agree with the content of this note. No further action required.  MichDebbora DusarmD Clinical Pharmacist [facility]-[phone]

## 2021-06-04 NOTE — Addendum Note (Signed)
Addended by: [name] on: 06/04/2021 12:54 PM   Modules accepted: Orders

## 2021-06-04 NOTE — Telephone Encounter (Signed)
Plz mail iFOB kit.

## 2021-06-15 ENCOUNTER — Other Ambulatory Visit: Payer: Self-pay | Admitting: Family Medicine

## 2021-06-16 ENCOUNTER — Encounter: Payer: Self-pay | Admitting: Family Medicine

## 2021-06-16 ENCOUNTER — Ambulatory Visit (INDEPENDENT_AMBULATORY_CARE_PROVIDER_SITE_OTHER): Payer: HMO | Admitting: Family Medicine

## 2021-06-16 ENCOUNTER — Other Ambulatory Visit: Payer: Self-pay

## 2021-06-16 VITALS — BP 148/64 | HR 90 | Temp 97.6°F | Ht 63.0 in | Wt 157.2 lb

## 2021-06-16 DIAGNOSIS — M3500 Sicca syndrome, unspecified: Secondary | ICD-10-CM | POA: Diagnosis not present

## 2021-06-16 DIAGNOSIS — N289 Disorder of kidney and ureter, unspecified: Secondary | ICD-10-CM | POA: Diagnosis not present

## 2021-06-16 DIAGNOSIS — R519 Headache, unspecified: Secondary | ICD-10-CM

## 2021-06-16 DIAGNOSIS — I10 Essential (primary) hypertension: Secondary | ICD-10-CM

## 2021-06-16 MED ORDER — CARVEDILOL 6.25 MG PO TABS
6.2500 mg | ORAL_TABLET | Freq: Two times a day (BID) | ORAL | 3 refills | Status: DC
Start: 2021-06-16 — End: 2021-10-14

## 2021-06-16 NOTE — Progress Notes (Signed)
Patient ID: Cassandra Benson, female    DOB: 1941/05/17, 80 y.o.   MRN: [id number]  This visit was conducted in person.  BP (!) 148/64 (BP Location: Right Arm, Cuff Size: Normal)   Pulse 90   Temp 97.6 F (36.4 C) (Temporal)   Ht '5\' 3"'  (1.6 m)   Wt 157 lb 3 oz (71.3 kg)   SpO2 98%   BMI 27.84 kg/m    CC: 3 mo f/u visit  Subjective:   HPI: Cassandra Benson is a 80 y.o. female presenting on 06/16/2021 for Follow-up (C/o rash when taking losartan, so pt has stopped med.  Also, c/o right leg swelling by end of day.  )   Recent itchy bumpy rash to forearms that may be coming from metoprolol succinate 62m which she takes nightly. She's stopped over last several nights with improvement of rash. Notes ongoing fatigue.   HA - started monthly Emgality for headache, continues Effexor 37.580mbid. Ongoing R temporal headache without any significant improvement.  Notes ongoing trouble with R eye blurry vision - to see eye doctor Cassandra KiEdison Paceext week.   Notes right leg swelling that worsens as day progresses. Feels tight but no pain. She normally does wear compression stockings.   Recent diverticulitis flare s/p ER evaluation, treated with cipro/flagyl 7d course.  After this she did have episodes of blood in stool, which has since resolved. Reassuring colonoscopy 07/2020.   Last CPE 08/2020.      Relevant past medical, surgical, family and social history reviewed and updated as indicated. Interim medical history since our last visit reviewed. Allergies and medications reviewed and updated. Outpatient Medications Prior to Visit  Medication Sig Dispense Refill   acetaminophen (TYLENOL) 500 MG tablet Take 1 tablet (500 mg total) by mouth 3 (three) times daily as needed.     Ascorbic Acid (VITAMIN C) 100 MG tablet Take 100 mg by mouth daily.     BD ULTRA-FINE LANCETS lancets Use as instructed upto 6 times daily 600 each 2   benzonatate (TESSALON) 100 MG capsule Take 1 capsule (100  mg total) by mouth 3 (three) times daily as needed for cough. 30 capsule 3   Cholecalciferol (VITAMIN D3) 25 MCG (1000 UT) CAPS Take 1 capsule (1,000 Units total) by mouth daily. 30 capsule    clopidogrel (PLAVIX) 75 MG tablet TAKE 1 TABLET(75 MG) BY MOUTH DAILY 30 tablet 6   Continuous Blood Gluc Transmit (DEXCOM G6 TRANSMITTER) MISC 1 kit by Does not apply route See admin instructions. Use to check blood sugar 1 each 1   diclofenac Sodium (VOLTAREN) 1 % GEL Apply 2 g topically 3 (three) times daily. 100 g 3   glucagon 1 MG injection Inject 1 mg into the muscle once as needed for up to 1 dose. 1 each 12   glucose blood test strip Use as instructed to check glucose 4 times daily. 400 each 3   Insulin Aspart FlexPen (NOVOLOG) 100 UNIT/ML USE UP TO 50 UNITS DAILY in insulin pump 50 mL 0   insulin degludec (TRESIBA FLEXTOUCH) 100 UNIT/ML FlexTouch Pen Inject 40 Units into the skin daily. 36 mL 0   Insulin Pen Needle (PEN NEEDLES) 32G X 5 MM MISC Use 1x a day 100 each 3   nitroGLYCERIN (NITROSTAT) 0.4 MG SL tablet Place 1 tablet (0.4 mg total) under the tongue every 5 (five) minutes as needed for chest pain. 25 tablet 1   rosuvastatin (CRESTOR) 20 MG tablet Take  1 tablet (20 mg total) by mouth daily. 90 tablet 3   SYNTHROID 75 MCG tablet TAKE ONE TABLET BY MOUTH monday-saturday before breakfast 80 tablet 1   venlafaxine (EFFEXOR) 37.5 MG tablet Take 37.5 mg once a day for a week then increase to 37.5 mg twice a day and continue that dose     metoprolol succinate (TOPROL-XL) 50 MG 24 hr tablet TAKE ONE TABLET BY MOUTH EVERYDAY AT BEDTIME take with OR immediately following A meal 90 tablet 1   losartan (COZAAR) 25 MG tablet Take 1 tablet (25 mg total) by mouth daily. (Patient not taking: Reported on 06/16/2021) 90 tablet 3   No facility-administered medications prior to visit.     Per HPI unless specifically indicated in ROS section below Review of Systems  Objective:  BP (!) 148/64 (BP Location:  Right Arm, Cuff Size: Normal)   Pulse 90   Temp 97.6 F (36.4 C) (Temporal)   Ht '5\' 3"'  (1.6 m)   Wt 157 lb 3 oz (71.3 kg)   SpO2 98%   BMI 27.84 kg/m   Wt Readings from Last 3 Encounters:  06/16/21 157 lb 3 oz (71.3 kg)  05/26/21 159 lb (72.1 kg)  05/22/21 160 lb (72.6 kg)      Physical Exam Vitals and nursing note reviewed.  Constitutional:      Appearance: Normal appearance. She is not ill-appearing.  Eyes:     Extraocular Movements: Extraocular movements intact.     Conjunctiva/sclera: Conjunctivae normal.     Pupils: Pupils are equal, round, and reactive to light.  Cardiovascular:     Rate and Rhythm: Normal rate and regular rhythm.     Pulses: Normal pulses.     Heart sounds: Normal heart sounds. No murmur heard. Pulmonary:     Effort: Pulmonary effort is normal. No respiratory distress.     Breath sounds: Normal breath sounds. No wheezing, rhonchi or rales.  Musculoskeletal:     Right lower leg: No edema.     Left lower leg: No edema.  Skin:    General: Skin is warm and dry.     Findings: No rash.  Neurological:     Mental Status: She is alert.  Psychiatric:        Mood and Affect: Mood normal.        Behavior: Behavior normal.      Lab Results  Component Value Date   CREATININE 0.83 05/22/2021   BUN 14 05/22/2021   NA 136 05/22/2021   K 3.6 05/22/2021   CL 100 05/22/2021   CO2 27 05/22/2021     Assessment & Plan:  This visit occurred during the SARS-CoV-2 public health emergency.  Safety protocols were in place, including screening questions prior to the visit, additional usage of staff PPE, and extensive cleaning of exam room while observing appropriate contact time as indicated for disinfecting solutions.   Problem List Items Addressed This Visit     Essential hypertension - Primary    Chronic, elevated today as she's stopped toprol XL due to concern over rash. Will switch BB to carvedilol 6.42m BID, continue losartan 285mdaily. I've asked her to  check BP at home and let usKoreanow if consistently above goal of <140/90.       Relevant Medications   carvedilol (COREG) 6.25 MG tablet   Intractable episodic headache    Ongoing, followd by neurology on Effexor and monthly Emgality injections, without significant improvement noted.  I suggested she ask  to see Cassandra Melrose Nakayama next neuro visit as she states prefers to see MD.       Relevant Medications   carvedilol (COREG) 6.25 MG tablet   Sjogren's syndrome without extraglandular involvement [facility])    Last saw rheumatology Cassandra Annalee Genta 02/2019.  Consider return.  She does have ophthalmology f/u planned soon.       Renal insufficiency    Latest renal function last month reassuringly normal.         Meds ordered this encounter  Medications   carvedilol (COREG) 6.25 MG tablet    Sig: Take 1 tablet (6.25 mg total) by mouth 2 (two) times daily with a meal.    Dispense:  60 tablet    Refill:  3    To replace toprol XL    No orders of the defined types were placed in this encounter.    Patient Instructions  Stop metoprolol due to possible rash. In its place, start carvedilol 6.11m twice daily.  Good to see you today.  Return in 3 months for physical/wellness visit  Follow up plan: Return in about 3 months (around 09/15/2021) for annual exam, prior fasting for blood work, medicare wellness visit.  JRia Bush MD

## 2021-06-16 NOTE — Patient Instructions (Addendum)
Stop metoprolol due to possible rash. In its place, start carvedilol 6.25mg  twice daily.  Good to see you today.  Return in 3 months for physical/wellness visit

## 2021-06-17 NOTE — Assessment & Plan Note (Addendum)
Last saw rheumatology Dr Annalee Genta 02/2019.  Consider return.  She does have ophthalmology f/u planned soon.

## 2021-06-17 NOTE — Assessment & Plan Note (Signed)
Latest renal function last month reassuringly normal.

## 2021-06-17 NOTE — Assessment & Plan Note (Addendum)
Ongoing, followd by neurology on Effexor and monthly Emgality injections, without significant improvement noted.  I suggested she ask to see Dr Melrose Nakayama next neuro visit as she states prefers to see MD.

## 2021-06-17 NOTE — Assessment & Plan Note (Addendum)
Chronic, elevated today as she's stopped toprol XL due to concern over rash. Will switch BB to carvedilol 6.25mg  BID, continue losartan 25mg  daily. I've asked her to check BP at home and let us know if consistently above goal of <140/90.

## 2021-06-18 ENCOUNTER — Telehealth: Payer: Self-pay

## 2021-06-18 ENCOUNTER — Encounter: Payer: Self-pay | Admitting: Family Medicine

## 2021-06-18 DIAGNOSIS — R202 Paresthesia of skin: Secondary | ICD-10-CM

## 2021-06-18 DIAGNOSIS — R519 Headache, unspecified: Secondary | ICD-10-CM

## 2021-06-18 DIAGNOSIS — G459 Transient cerebral ischemic attack, unspecified: Secondary | ICD-10-CM

## 2021-06-18 DIAGNOSIS — G453 Amaurosis fugax: Secondary | ICD-10-CM

## 2021-06-18 NOTE — Chronic Care Management (AMB) (Addendum)
  Chronic Care Management Pharmacy Assistant   Name: Cassandra Benson  MRN: [id number] DOB: 09/10/1941   Reason for Encounter: Medication Adherence and Delivery Coordination  Recent office visits:  06/16/21-PCP-Patient presented for follow up hypertension. BP elevated today as she's stopped toprol XL due to concern over rash. Will switch BB to carvedilol 6.25mg BID, continue losartan 25mg daily. I've asked her to check BP at home and let us know if consistently above goal of <140/90.    Recent consult visits:  None since last CCM contact  Hospital visits:  None in previous 6 months  Medications: Outpatient Encounter Medications as of 06/18/2021  Medication Sig   acetaminophen (TYLENOL) 500 MG tablet Take 1 tablet (500 mg total) by mouth 3 (three) times daily as needed.   Ascorbic Acid (VITAMIN C) 100 MG tablet Take 100 mg by mouth daily.   BD ULTRA-FINE LANCETS lancets Use as instructed upto 6 times daily   benzonatate (TESSALON) 100 MG capsule Take 1 capsule (100 mg total) by mouth 3 (three) times daily as needed for cough.   carvedilol (COREG) 6.25 MG tablet Take 1 tablet (6.25 mg total) by mouth 2 (two) times daily with a meal.   Cholecalciferol (VITAMIN D3) 25 MCG (1000 UT) CAPS Take 1 capsule (1,000 Units total) by mouth daily.   clopidogrel (PLAVIX) 75 MG tablet TAKE 1 TABLET(75 MG) BY MOUTH DAILY   Continuous Blood Gluc Transmit (DEXCOM G6 TRANSMITTER) MISC 1 kit by Does not apply route See admin instructions. Use to check blood sugar   diclofenac Sodium (VOLTAREN) 1 % GEL Apply 2 g topically 3 (three) times daily.   glucagon 1 MG injection Inject 1 mg into the muscle once as needed for up to 1 dose.   glucose blood test strip Use as instructed to check glucose 4 times daily.   Insulin Aspart FlexPen (NOVOLOG) 100 UNIT/ML USE UP TO 50 UNITS DAILY in insulin pump   insulin degludec (TRESIBA FLEXTOUCH) 100 UNIT/ML FlexTouch Pen Inject 40 Units into the skin daily.    Insulin Pen Needle (PEN NEEDLES) 32G X 5 MM MISC Use 1x a day   losartan (COZAAR) 25 MG tablet Take 1 tablet (25 mg total) by mouth daily. (Patient not taking: Reported on 06/16/2021)   nitroGLYCERIN (NITROSTAT) 0.4 MG SL tablet Place 1 tablet (0.4 mg total) under the tongue every 5 (five) minutes as needed for chest pain.   rosuvastatin (CRESTOR) 20 MG tablet Take 1 tablet (20 mg total) by mouth daily.   SYNTHROID 75 MCG tablet TAKE ONE TABLET BY MOUTH monday-saturday before breakfast   venlafaxine (EFFEXOR) 37.5 MG tablet Take 37.5 mg once a day for a week then increase to 37.5 mg twice a day and continue that dose   No facility-administered encounter medications on file as of 06/18/2021.    BP Readings from Last 3 Encounters:  06/16/21 (!) 148/64  05/26/21 134/62  05/22/21 (!) 141/129    Lab Results  Component Value Date   HGBA1C 9.7 (A) 12/03/2020     Last adherence delivery date:05/31/21      Patient is due for next adherence delivery on: 06/29/21  Multiple attempts made to reach patient. Unsuccessful outreach. Will refill based off of last adherence fill.   This delivery to include: Vials  30 Days VIAL medications: Preservision Areds 2 - 2 capsules daily Crestor 20 mg - 1 tablet daily (evening meal) Plavix 75 mg - 1 tablet daily (evening meal) Synthroid 75 mcg - 1   tablet daily Monday-Saturday except Thursdays (before breakfast) Venlafaxine 37.5 mg - 1 tablet twice daily (breakfast and evening meal) [name] - Inject 46 units daily Losartan 48m - 1 tablet daily (breakfast) Novolog 100units- Inject up to 50 units daily in pump Carvedilol 6.266m take 1 tablet 2 times daily  Patient declined the following medications this month: Metoprolol ER 50 mg - 1 tablet daily (bedtime) 06/16/21 PCP stopped  Annual wellness visit in last year? Yes  09/16/2020 Most Recent BP reading: 148/64  90-P 06/16/21  If Diabetic: Most recent A1C reading:  9.7  12/03/20 Last eye exam  / retinopathy screening: 10/2020 Last diabetic foot exam: 08/2020  MiDebbora DusCPP notified  VeAvel SensorCCKremmlingssistant [phone] have reviewed the care management and care coordination activities outlined in this encounter and I am certifying that I agree with the content of this note. No further action required.  MiDebbora DusPharmD Clinical Pharmacist [facility] at [phone]

## 2021-06-22 ENCOUNTER — Encounter: Payer: Self-pay | Admitting: Family Medicine

## 2021-06-24 DIAGNOSIS — E113293 Type 2 diabetes mellitus with mild nonproliferative diabetic retinopathy without macular edema, bilateral: Secondary | ICD-10-CM | POA: Diagnosis not present

## 2021-07-15 ENCOUNTER — Other Ambulatory Visit: Payer: Self-pay | Admitting: Vascular Surgery

## 2021-07-16 ENCOUNTER — Telehealth: Payer: Self-pay

## 2021-07-16 NOTE — Chronic Care Management (AMB) (Addendum)
Chronic Care Management Pharmacy Assistant   Name: Tiphani Mells  MRN: [id number] DOB: [birth year]  Reason for Encounter: Medication Adherence and Delivery Coordination  Recent office visits:  None since last CCM contact  Recent consult visits:  None since last CCM contact  Hospital visits:  None in previous 6 months  Medications: Outpatient Encounter Medications as of 07/16/2021  Medication Sig   acetaminophen (TYLENOL) 500 MG tablet Take 1 tablet (500 mg total) by mouth 3 (three) times daily as needed.   Ascorbic Acid (VITAMIN C) 100 MG tablet Take 100 mg by mouth daily.   BD ULTRA-FINE LANCETS lancets Use as instructed upto 6 times daily   benzonatate (TESSALON) 100 MG capsule Take 1 capsule (100 mg total) by mouth 3 (three) times daily as needed for cough.   carvedilol (COREG) 6.25 MG tablet Take 1 tablet (6.25 mg total) by mouth 2 (two) times daily with a meal.   Cholecalciferol (VITAMIN D3) 25 MCG (1000 UT) CAPS Take 1 capsule (1,000 Units total) by mouth daily.   clopidogrel (PLAVIX) 75 MG tablet TAKE ONE TABLET BY MOUTH EVERY EVENING   Continuous Blood Gluc Transmit (DEXCOM G6 TRANSMITTER) MISC 1 kit by Does not apply route See admin instructions. Use to check blood sugar   diclofenac Sodium (VOLTAREN) 1 % GEL Apply 2 g topically 3 (three) times daily.   glucagon 1 MG injection Inject 1 mg into the muscle once as needed for up to 1 dose.   glucose blood test strip Use as instructed to check glucose 4 times daily.   Insulin Aspart FlexPen (NOVOLOG) 100 UNIT/ML USE UP TO 50 UNITS DAILY in insulin pump   insulin degludec (TRESIBA FLEXTOUCH) 100 UNIT/ML FlexTouch Pen Inject 40 Units into the skin daily.   Insulin Pen Needle (PEN NEEDLES) 32G X 5 MM MISC Use 1x a day   losartan (COZAAR) 25 MG tablet Take 1 tablet (25 mg total) by mouth daily. (Patient not taking: Reported on 06/16/2021)   nitroGLYCERIN (NITROSTAT) 0.4 MG SL tablet Place 1 tablet (0.4 mg total) under  the tongue every 5 (five) minutes as needed for chest pain.   rosuvastatin (CRESTOR) 20 MG tablet Take 1 tablet (20 mg total) by mouth daily.   SYNTHROID 75 MCG tablet TAKE ONE TABLET BY MOUTH monday-saturday before breakfast   venlafaxine (EFFEXOR) 37.5 MG tablet Take 37.5 mg once a day for a week then increase to 37.5 mg twice a day and continue that dose   No facility-administered encounter medications on file as of 07/16/2021.   BP Readings from Last 3 Encounters:  06/16/21 (!) 148/64  05/26/21 134/62  05/22/21 (!) 141/129    Lab Results  Component Value Date   HGBA1C 9.7 (A) 12/03/2020      No OVs, Consults, or hospital visits since last care coordination call / Pharmacist visit. No medication changes indicated   Last adherence delivery date:06/29/21      Patient is due for next adherence delivery on: 07/29/21  Spoke with patient on 07/16/21 reviewed medications and coordinated delivery.  This delivery to include: Vials  30 Days    VIAL medications: [name] -use 4 times daily   Patient declined the following medications this month: Preservision Areds 2 - 2 capsules daily-  getting OTC at this time  Crestor 20 mg - 1 tablet daily (evening meal) 10/21- 62 pills on hand Plavix 75 mg - 1 tablet daily (evening meal)10/21-  36 pills on hand Synthroid  75 mcg - 1 tablet daily Monday-Saturday  none on sundays (before breakfast) 10/21  53 pills on hand Venlafaxine 37.5 mg - 1 tablet twice daily (breakfast and evening meal)  10/21- 68 pills on hand Tresiba Flextouch U-100 - Inject 46 units daily 10/21-  3 vials on hand Losartan 32m - 1 tablet daily (breakfast) 10/21- 30ds on hand wants to discuss with PCP Novolog 100units- Inject up to 50 units daily in pump 10/21- has 2 full boxes on hand  Carvedilol 6.275m take 1 tablet 2 times daily 10/21- 66 pills on hand    Any concerns about your medications? The patient reports she has surplus of medications and  unsure why. The patient states she cares about her health and is diligent with her doses of medications, documents her readings.  How often do you forget or accidentally miss a dose? Never  Do you use a pillbox? Yes  Refills requested from providers include: Plavix 7559mConfirmed delivery date of 07/29/21, advised patient that pharmacy will contact them the morning of delivery.  Recent blood pressure readings are as follows: no readings available  Recent blood glucose readings are as follows: Fasting: 07/16/21- 252 11:00am not had anything to eat yet. Unable to find her notebook for other readings  Annual wellness visit in last year? Yes Most Recent BP reading:148/64  9-P 06/16/21  If Diabetic: Most recent A1C reading: 9.7 Last eye exam / retinopathy screening: 10/2020  Last diabetic foot exam: 08/2020  MicDebbora DusPP notified  VelAvel SensorCMWaverlysistant [phone] have reviewed the care management and care coordination activities outlined in this encounter and I am certifying that I agree with the content of this note. Will continue to monitor adherence. No further action required.  MicDebbora DusharmD Clinical Pharmacist [facility] at [phone]

## 2021-07-16 NOTE — Progress Notes (Signed)
Entered in error

## 2021-07-16 NOTE — Telephone Encounter (Signed)
Called and LVM to follow up with pt regarding on call message regarding high blood sugars and a medication refill. Pt needs follow up appt with provider.

## 2021-07-16 NOTE — Telephone Encounter (Signed)
Pt called back and scheduled follow up appt. Pt has insulin to cover her until scheduled appt.

## 2021-07-18 ENCOUNTER — Encounter: Payer: Self-pay | Admitting: Internal Medicine

## 2021-07-19 ENCOUNTER — Encounter: Payer: Self-pay | Admitting: Internal Medicine

## 2021-07-27 ENCOUNTER — Telehealth: Payer: Self-pay

## 2021-07-27 NOTE — Progress Notes (Addendum)
Chronic Care Management Pharmacy Assistant   Name: Alea Ryer  MRN: [id number] DOB: [birth year]  Reason for Encounter: CCM (General Adherence)   Recent office visits:  None since last CCM contact  Recent consult visits:  None since last CCM contact  Hospital visits:  None since last CCM contact  Medications: Outpatient Encounter Medications as of 07/27/2021  Medication Sig   acetaminophen (TYLENOL) 500 MG tablet Take 1 tablet (500 mg total) by mouth 3 (three) times daily as needed.   Ascorbic Acid (VITAMIN C) 100 MG tablet Take 100 mg by mouth daily.   BD ULTRA-FINE LANCETS lancets Use as instructed upto 6 times daily   benzonatate (TESSALON) 100 MG capsule Take 1 capsule (100 mg total) by mouth 3 (three) times daily as needed for cough.   carvedilol (COREG) 6.25 MG tablet Take 1 tablet (6.25 mg total) by mouth 2 (two) times daily with a meal.   Cholecalciferol (VITAMIN D3) 25 MCG (1000 UT) CAPS Take 1 capsule (1,000 Units total) by mouth daily.   clopidogrel (PLAVIX) 75 MG tablet TAKE ONE TABLET BY MOUTH EVERY EVENING   Continuous Blood Gluc Transmit (DEXCOM G6 TRANSMITTER) MISC 1 kit by Does not apply route See admin instructions. Use to check blood sugar   diclofenac Sodium (VOLTAREN) 1 % GEL Apply 2 g topically 3 (three) times daily.   glucagon 1 MG injection Inject 1 mg into the muscle once as needed for up to 1 dose.   glucose blood test strip Use as instructed to check glucose 4 times daily.   Insulin Aspart FlexPen (NOVOLOG) 100 UNIT/ML USE UP TO 50 UNITS DAILY in insulin pump   insulin degludec (TRESIBA FLEXTOUCH) 100 UNIT/ML FlexTouch Pen Inject 40 Units into the skin daily.   Insulin Pen Needle (PEN NEEDLES) 32G X 5 MM MISC Use 1x a day   losartan (COZAAR) 25 MG tablet Take 1 tablet (25 mg total) by mouth daily. (Patient not taking: Reported on 06/16/2021)   nitroGLYCERIN (NITROSTAT) 0.4 MG SL tablet Place 1 tablet (0.4 mg total) under the tongue every 5  (five) minutes as needed for chest pain.   rosuvastatin (CRESTOR) 20 MG tablet Take 1 tablet (20 mg total) by mouth daily.   SYNTHROID 75 MCG tablet TAKE ONE TABLET BY MOUTH monday-saturday before breakfast   venlafaxine (EFFEXOR) 37.5 MG tablet Take 37.5 mg once a day for a week then increase to 37.5 mg twice a day and continue that dose   No facility-administered encounter medications on file as of 07/27/2021.   Contacted [place] on 08/06/2021 for general disease state and medication adherence call.   Patient is not > 5 days past due for refill on the following medications per chart history:  [facility] Medications: Medication Name/mg Last Fill Days Supply Rosuvastatin 59m  06/28/2021 30 Losartan 271m 06/23/2021 30   Tresiba Flextouch U-100 06/23/2021 34 Insulin Aspart Flexpen 41m32m0/15/2022 9  Fill date for Insulin Aspart Flexpen verified with [facility]  Patient states at one time she got a double supply of Losartan 25 mg. Patient still has a 30ds remaining. Spoke with patient in regards to all medication; she is compliant.   What concerns do you have about your medications? No concerns at this time.   The patient denies side effects with her medications.   How often do you forget or accidentally miss a dose? Never  Do you use a pillbox? Yes  Are you having any problems getting your medications  from your pharmacy? No  Has the cost of your medications been a concern? No  Since last visit with CPP, no interventions have been made:   The patient has not had an ED visit since last contact.   The patient states problems with their health. She wishes she didn't have headaches; which is her worst condition. Patient states her right side chest region with her bones are tender and sore. Movement can make it hurt. She is going to talk to Dr. Darlin Priestly at her next appointment on 09/15/2021.  she denies  concerns or questions for Debbora Dus, Pharm. D at this time.    Counseled patient on:  Great job taking medications  Care Gaps: Annual wellness visit in last year? Yes Most Recent BP reading: 148/64 on 06/16/2021  If Diabetic: Most recent A1C reading: 9.7 on 12/03/2020 Last eye exam / retinopathy screening: Up to date Last diabetic foot exam: Up to date  Endocrinology appointment on 08/02/2021  Debbora Dus, CPP notified  [name], [place] Assistant [phone]  I have reviewed the care management and care coordination activities outlined in this encounter and I am certifying that I agree with the content of this note. No further action required.  Debbora Dus, PharmD Clinical Pharmacist [facility] at [facility] [phone]

## 2021-08-02 ENCOUNTER — Encounter: Payer: Self-pay | Admitting: Internal Medicine

## 2021-08-02 ENCOUNTER — Ambulatory Visit: Payer: HMO | Admitting: Internal Medicine

## 2021-08-02 ENCOUNTER — Other Ambulatory Visit: Payer: Self-pay

## 2021-08-02 VITALS — BP 110/58 | HR 68 | Ht 63.0 in | Wt 157.8 lb

## 2021-08-02 DIAGNOSIS — E038 Other specified hypothyroidism: Secondary | ICD-10-CM

## 2021-08-02 DIAGNOSIS — E063 Autoimmune thyroiditis: Secondary | ICD-10-CM | POA: Diagnosis not present

## 2021-08-02 DIAGNOSIS — E139 Other specified diabetes mellitus without complications: Secondary | ICD-10-CM

## 2021-08-02 DIAGNOSIS — E785 Hyperlipidemia, unspecified: Secondary | ICD-10-CM | POA: Diagnosis not present

## 2021-08-02 DIAGNOSIS — E1369 Other specified diabetes mellitus with other specified complication: Secondary | ICD-10-CM | POA: Diagnosis not present

## 2021-08-02 DIAGNOSIS — E1169 Type 2 diabetes mellitus with other specified complication: Secondary | ICD-10-CM

## 2021-08-02 LAB — POCT GLYCOSYLATED HEMOGLOBIN (HGB A1C): Hemoglobin A1C: 10 % — AB (ref 4.0–5.6)

## 2021-08-02 NOTE — Progress Notes (Signed)
Patient ID: Cassandra Benson, female   DOB: July 29, 1941, 80 y.o.   MRN: [id number]   This visit occurred during the SARS-CoV-2 public health emergency.  Safety protocols were in place, including screening questions prior to the visit, additional usage of staff PPE, and extensive cleaning of exam room while observing appropriate contact time as indicated for disinfecting solutions.   HPI: Cassandra Benson is a 80 y.o.-year-old female, returning for f/u for LADA, initially dx'ed in 1998 (80 y/o), started insulin at dx, started insulin pump in ~2008, uncontrolled, without complications and also Hashimoto's hypothyroidism. She previously saw endocrinology at [facility] (Dr. Janese Banks) and Dr Howell Rucks. Last visit with me 4 months ago (virtual) she continues to have.  Interim history: She continues to have headaches, muscle and joint aches. She also has chronic blurry vision (most in R eye), headaches-on Emgality once a month, Effexor.  Sees neurology.  Will get a second opinion soon with Dr. Leta Baptist. She recently had a rash from losartan, now off.  However, retrospectively, metoprolol could have been a possible culprit and she is now off of this, also.  On carvedilol instead.   She had diverticulitis 04/2021.  Reviewed HbA1c levels: Lab Results  Component Value Date   HGBA1C 9.7 (A) 12/03/2020   HGBA1C 12.0 (A) 09/04/2020   HGBA1C 9.3 (A) 05/25/2020   She is on: - Tresiba 20-22 units in am ... >> 40 >> 46 units daily - Novolog: ICR: 1:8 (starches)-1:10 (other foods), but mostly using carb equivalents >> 1:15 (5-10) units per meal Target: 130 Insulin sensitivity factor: 20 If you correct sugars at bedtime, only correct >200 and only give 2 to 3 units. She was previously using Glucophage ER d.a.w. but had to stop because this was not covered by her insurance.  She tried the generic metformin ER and this caused diarrhea so she had to stop.  Insulin pump:  -Previously: Medtronic 723-started 09/2016  (changed 07/2017), without CGM.   She was using [employer] for supplies before, but she is then forced by insurance to use [employer].  She came off the pump in the past as she had a lot of problems getting supplies from them. -then T:slim x2 - started 2020 - stopped since last OV  CGM: -Dexcom G6 >> off now  Prev. Pump settings: - basal rates: 12 am: 0.800 units/h >> 0.950 3 am: 0.900 >> 1.000 9:30 am: 0.650 >> 0.750 5 pm: 1.300 6:30 pm: 1.200 11:30 pm: 1.200 - ICR: 1:8 - target: 110 - ISF:  12 am: 20 8 pm: 30 - Active insulin time: 4h TDD from basal insulin: 52% (18 units) >> 55% (18 units) TDD from bolus insulin: 48% (17 units) >> 45% (15 units) TDD: 35 to 50 units >> 35-50 >> up to 50 units  - extended bolusing: not using - changes infusion site: q3 days  She checks her sugars 1-2x a day -Per review of her meter download: - am: 172-254, 280 >> 150s (115-170) >> 200-300 >> 115, 147-333 - 2h after b'fast: n/c - lunch: 188-239 >> 345 >> n/c >> see above - 2h after lunch: 292 >> n/c >> 181 >> n/c - dinner: 254, 260 >> 85 >> 145-160 >> 175-215 >> 72 - 2h after dinner: 128, 270 >> n/c - bedtime: 255 >> n/c >> 115-175 - nighttime: 72 - once a month Lowest sugar was 45 x1 >> 72; she has hypoglycemia awareness in the 80s.  No history of hypoglycemia admissions.  She has a glucagon  kit at home. Highest sugar was 385 >> 449; no history of DKA admissions.  Pt's meals are: - Breakfast: protein drink + almond milk - usually skips - Lunch: PB jelly sandwich or sandwich with ham or cream cheese sandwich - Dinner: salads  - Snacks: pretzels; pork tenderloin + veggies; chicken + tenderloin No sodas.  She stopped artificial sweeteners (sweet and low) lipids improved.  No CKD, last BUN/creatinine:  Lab Results  Component Value Date   BUN 14 05/22/2021   BUN 11 12/04/2020   CREATININE 0.83 05/22/2021   CREATININE 0.86 12/04/2020  On lisinopril.  + HL;  last set of lipids: Lab  Results  Component Value Date   CHOL 155 12/05/2020   HDL 41 12/05/2020   LDLCALC 94 12/05/2020   LDLDIRECT 113.0 09/09/2020   TRIG 102 12/05/2020   CHOLHDL 3.8 12/05/2020  On Crestor 10 -she continues to have muscle cramps despite using CoQ10.  She takes [employer].  Praluent was not covered.  - last eye exam was in 10/2020: No DR, macular degeneration worse - -on Areds She previously saw an ophthalmologist in [place] and would like to establishing care and [place].   - No numbness and tingling in her feet.  She was admitted for CP + SOB 10/01/2017.  Cardiac events and PE were ruled out.  She is seeing cardiology (Dr. Rockey Situ). She had right carotid ultrasound in 2020 and there is a 39% stenosis, significantly increased since 2018.  She has a history of left carotid endarterectomy. She was started on compression hoses by [facility].  These helped. 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  Hypothyroidism: -Due to Hashimoto's thyroiditis -Positive family history of Graves' disease in her mother -She was initially on Levoxyl, but she thought Levoxyl increased her lipid levels and caused hair loss, so we changed to Synthroid.  She feels better on Synthroid.  She is currently on Synthroid 75 mcg 6 out of 7 days: - in am - fasting - at least 30 min from b'fast - no calcium - no iron - + multivitamins at night - no PPIs - not on Biotin  Reviewed her TFTs: Lab Results  Component Value Date   TSH 2.16 09/09/2020   TSH 1.73 11/27/2019   TSH 0.12 (L) 10/07/2019   TSH 0.22 (L) 09/10/2019   TSH 1.10 11/06/2018   TSH 0.09 (L) 08/29/2018   TSH 0.92 11/27/2017   TSH 0.30 (L) 10/11/2017   TSH 0.24 (L) 08/11/2017   TSH 0.58 01/04/2017   She also has a history of SLE and Sjogren's syndrome.  She has generalized muscle aches and joint pains. In 2015, we checked her hormone levels in the setting of hirsutism and and they  were normal.  Free testosterone, LH, FSH, DHEA-S, dexamethasone suppression test and estradiol were all normal.  Also, an AM cortisol and ACTH levels were normal. She was admitted 01/15/2020 with SBO. She was also diagnosed with mild cognitive impairment and was started on Namenda last year. 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  She had negative biopsies in the past.  Not on steroids.  She is on Lyrica and Effexor.   She was in the emergency room for headaches and neck pain on 11/19/2020.  ROS: + See HPI Musculoskeletal: + muscle aches/+ joint aches Skin: + acne, which she attributes to NovoLog Neurological:  ++ HAs  I reviewed pt's medications, allergies, PMH, social  hx, family hx, and changes were documented in the history of present illness. Otherwise, unchanged from my initial visit note.  Past Medical History:  Diagnosis Date   Acute diverticulitis 04/2021   [facility] ER, CT confirmed   Allergy    ANA positive    positive ANA pattern 1 speckled   Arthritis    Carotid stenosis, asymptomatic 06/19/2015   3-66% RICA 44-03% LICA rpt 1 yr (12/7423)    Colon polyps    Dermatomyositis ([place])    Diabetes mellitus without complication ([place])    Type 1   Diverticulosis    sigmoid on CT scan 12/2019   Family history of adverse reaction to anesthesia    brothr went into cardiac arrest from anectine   Fibromyalgia    prior PCP   GERD (gastroesophageal reflux disease)    prior PCP   Glaucoma    Narrow angle   History of blood clots    DVT, in 20s, none since   History of chicken pox    History of diverticulitis    History of pericarditis 1986   with hospitalization   History of pneumonia 2014   History of shingles    History of UTI    Hyperlipidemia    Hypertension    Hypothyroidism    Mixed connective tissue disease ([place])    Partial small bowel obstruction ([place]) 12/2019   managed conservatively   Peptic ulcer    Pneumonia    PONV (postoperative  nausea and vomiting)    Raynaud's disease without gangrene    Shoulder pain left   h/o RTC tendonitis and adhesive capsulitis   Sjogren's syndrome ([place])    Sleep apnea    prior PCP - no CPAP for about 10 yrs   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[place])    Vitamin D deficiency    prior PCP   Past Surgical History:  Procedure Laterality Date   ABDOMINAL HYSTERECTOMY  1978   fibroids and menorrhagia, ovaries remain   ARTERY BIOPSY Right 04/06/2018   Procedure: BIOPSY TEMPORAL ARTERY RIGHT;  Surgeon: Beverly Gust, MD;  Location: [facility];  Service: ENT;  Laterality: Right;  Diabetic - insulin pump sleep apnea   [facility] normal per patient   COLONOSCOPY  10/2011   1 TA, 1 HP, very tortuous colon ([place])   COLONOSCOPY  02/2020   TA, inflammatory polyp, mod diverticulosis, int/ext hemorrhoids ([place]) no rpt recommended    COLONOSCOPY WITH ESOPHAGOGASTRODUODENOSCOPY (EGD)  03/2007   2 ulcers, benign polyp, rpt 5 yrs [facility]iology, [facility])   JOINT REPLACEMENT Right    hip   PARTIAL HIP ARTHROPLASTY  2013   Right hip replacement   TONSILLECTOMY     TONSILLECTOMY AND ADENOIDECTOMY     TRANSCAROTID ARTERY REVASCULARIZATION  Left 07/23/2018   Procedure: TRANSCAROTID ARTERY REVASCULARIZATION;  Surgeon: Marty Heck, MD;  Location: [facility];  Service: Vascular;  Laterality: Left;   TUBAL LIGATION     VAGINAL DELIVERY     x2, no complications   Social History   Social History   Widowed    Number of children: 2   Occupational History  Retired    Social History Main Topics   Smoking status: Never Smoker   Smokeless tobacco: Never Used   Alcohol use No   Drug use: No   Social History Narrative   Lives in [place] now - moved from [place].   No pets.   Mother of Daisie Haft.  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     Ascorbic Acid (VITAMIN C) 100 MG tablet Take 100 mg by mouth daily.     BD ULTRA-FINE LANCETS lancets Use as instructed upto 6 times daily 600 each 2   benzonatate (TESSALON) 100 MG capsule Take 1 capsule (100 mg total) by mouth 3 (three) times daily as needed for cough. 30 capsule 3   carvedilol (COREG) 6.25 MG tablet Take 1 tablet (6.25 mg total) by mouth 2 (two) times daily with a meal. 60 tablet 3   Cholecalciferol (VITAMIN D3) 25 MCG (1000 UT) CAPS Take 1 capsule (1,000 Units total) by mouth daily. 30 capsule    clopidogrel (PLAVIX) 75 MG tablet TAKE ONE TABLET BY MOUTH EVERY EVENING 30 tablet 6   Continuous Blood Gluc Transmit (DEXCOM G6 TRANSMITTER) MISC 1 kit by Does not apply route See admin instructions. Use to check blood sugar 1 each 1   diclofenac Sodium (VOLTAREN) 1 % GEL Apply 2 g topically 3 (three) times daily. 100 g 3   glucagon 1 MG injection Inject 1 mg into the muscle once as needed for up to 1 dose. 1 each 12   glucose blood test strip Use as instructed to check glucose 4 times daily. 400 each 3   Insulin Aspart FlexPen (NOVOLOG) 100 UNIT/ML USE UP TO 50 UNITS DAILY in insulin pump 50 mL 0   insulin degludec (TRESIBA FLEXTOUCH) 100 UNIT/ML FlexTouch Pen Inject 40 Units into the skin daily. 36 mL 0   Insulin Pen Needle (PEN NEEDLES) 32G X 5 MM MISC Use 1x a day 100 each 3   losartan (COZAAR) 25 MG tablet Take 1 tablet (25 mg total) by mouth daily. (Patient not taking: Reported on 06/16/2021) 90 tablet 3   nitroGLYCERIN (NITROSTAT) 0.4 MG SL tablet Place 1 tablet (0.4 mg total) under the tongue every 5 (five) minutes as needed for chest pain. 25 tablet 1   rosuvastatin (CRESTOR) 20 MG tablet  Take 1 tablet (20 mg total) by mouth daily. 90 tablet 3   SYNTHROID 75 MCG tablet TAKE ONE TABLET BY MOUTH monday-saturday before breakfast 80 tablet 1   venlafaxine (EFFEXOR) 37.5 MG tablet Take 37.5 mg once a day for a week then increase to 37.5 mg twice a day and continue that dose     No current facility-administered medications on file prior to visit.   Allergies  Allergen Reactions   Iodinated Diagnostic Agents Other (See Comments)    Itching (severe) and chest tightness   Penicillins Anaphylaxis, Swelling, Rash and Other (See Comments)    Has patient had a PCN reaction causing immediate rash, facial/tongue/throat swelling, SOB or lightheadedness with hypotension: Yes Has patient had a PCN reaction causing severe rash involving mucus membranes or skin necrosis: yes - remotely Has patient had a PCN reaction that required hospitalization: occurred while hospitalized Has patient had a PCN reaction occurring within the last 10 years: No If all of the above answers are "NO", then may proceed with Cephalosporin use.    Amlodipine Swelling  Pedal edema   Anectine [Succinylcholine] Other (See Comments)    Brother went into cardiac arrest.   Codeine Nausea Only   Gabapentin Other (See Comments)    Gait abnormality   Influenza Vaccines Other (See Comments)    Muscle weakness; unable to walk   Nortriptyline Other (See Comments)    Eye swelling and mouth drawed up   Pamelor [Nortriptyline Hcl] Other (See Comments)    Patient states caused her face to draw in together.   Valsartan Other (See Comments) and Cough    Allergy to generic only, "Hacking" cough   Zetia [Ezetimibe] Other (See Comments)    Bad muscle cramps   Erythromycin Rash and Swelling   Sulfa Antibiotics Rash   Family History  Problem Relation Age of Onset   CAD Mother 32       MI, aortic valve issues   COPD Mother    Lupus Mother    [name] Mother    Rheum arthritis Mother    CAD Father 51       CABG x2,  aortic valve replacement   Stroke Sister    CAD Sister    10 Sister        brain   Lupus Sister    Diabetes Sister    Alcohol abuse Brother    CAD Brother 45       MI   Stroke Brother    Seizures Son    COPD Brother        agent orange   CAD Brother 29       stent   Diabetes Brother    Depression Grandchild    Breast cancer Maternal Aunt    Diabetes Sister    Breast cancer Sister    Breast cancer Maternal Aunt    Stroke Maternal Grandmother    Hypertension Maternal Grandmother    Gallbladder disease Maternal Grandmother    Colon cancer Neg Hx    Esophageal cancer Neg Hx    Rectal cancer Neg Hx    Stomach cancer Neg Hx    PE: BP (!) 110/58 (BP Location: Right Arm, Patient Position: Sitting, Cuff Size: Normal)   Pulse 68   Ht '5\' 3"'  (1.6 m)   Wt 157 lb 12.8 oz (71.6 kg)   SpO2 98%   BMI 27.95 kg/m  Wt Readings from Last 3 Encounters:  08/02/21 157 lb 12.8 oz (71.6 kg)  06/16/21 157 lb 3 oz (71.3 kg)  05/26/21 159 lb (72.1 kg)   Constitutional: normal weight, in NAD Eyes: PERRLA, EOMI, no exophthalmos ENT: moist mucous membranes, no thyromegaly, no cervical lymphadenopathy Cardiovascular: RRR, No MRG Respiratory: CTA B Gastrointestinal: abdomen soft, NT, ND, BS+ Musculoskeletal: no deformities, strength intact in all 4 Skin: moist, warm, no rashes Neurological: no tremor with outstretched hands, DTR normal in all 4  ASSESSMENT: 1. DM1, uncontrolled, without long-term complications, but with hyperglycemia  2.  Hashimoto's hypothyroidism  3. HL  PLAN:  1. Patient with uncontrolled LADA, on basal-bolus insulin regimen, previously on an insulin pump, but with problems obtaining pump supplies in the past.  At last visit she was on [place] and NovoLog.  Sugars were not well controlled, in fact they were much worse, and at that time, upon questioning, she was drinking sweet drinks.  I strongly advised her to stop.  I also advised her to increase [place] until  she was able to start back on insulin pump, which she was planning to do.  I advised  her that she absolutely needed to get back on the pump to gain better control of her diabetes.  She agreed with this.  At that time, our appointment was virtual so we could not check an HbA1c.  Latest HbA1c checked was from 11/2020 and this was 9.7%. -At this visit, she continues on [place] and NovoLog as she was not able to switch to the pump.  Also, she does not have supplies of her Dexcom CGM for now but she will try to obtain this before her next visit.  Reviewing her meter downloads, it appears that she mostly checks midday, which is before her first meal of the day.  This sugars are usually high, in the 200s and 300s.  In the last 2 weeks, she had 1 blood sugar check in the morning, around 8 AM, and this was closer to goal, at 147.  Therefore, it appears that her sugars are increasing midday and upon questioning she has coffee in the morning which she does not cover with insulin.  At this visit, we discussed about starting 5 units of NovoLog before coffee and she may need to increase this if sugars remain high midday.  She only has 1 blood sugar check before dinner in the last 2 weeks and this is low, at 72.  I suspect that this is due to her sugars being too high midday.  Hopefully, by improving these, will also improve the before dinner blood sugars.  For now, I would suggest to continue the rest of the regimen, except, she mentions that she still corrects blood sugars at bedtime even if they are lower than 200s and I advised her not to do so to avoid low blood sugars during the night. - I advised her to:  Patient Instructions  Please continue: - Tresiba 46 units in am  - Novolog: ICR: 1:15 (carb equivalents) Target: 130 Insulin sensitivity factor: 20 130-150: +1 unit 151-170: +2 units 171-190: +3 units 191-210: +4 units 211-230: +5 units 231-250: +6 units >250: +7 units If you correct sugars at bedtime,  only correct >200 and only give 2 to 3 units.  Please add Novolog before coffee: start with 5 units.  Try to switch to the pump.  Please return in 3 months with your sugar log.    - we checked her HbA1c: 10% (higher) - advised to check sugars at different times of the day - 4x a day, rotating check times - advised for yearly eye exams >> she is UTD - return to clinic in 3 months  2.  Hashimoto's hypothyroidism -She continues to have a palpable thyroid, most likely due to inflammation in the setting of Hashimoto's thyroiditis - latest thyroid labs reviewed with pt. >> normal: Lab Results  Component Value Date   TSH 2.16 09/09/2020  - she continues on LT4 75 mcg 6 out of 7 days - pt feels good on this dose. - we discussed about taking the thyroid hormone every day, with water, >30 minutes before breakfast, separated by >4 hours from acid reflux medications, calcium, iron, multivitamins. Pt. is taking it correctly. - she has an annual physical exam with PCP coming up next month - will get a TSH checked then  3. HL - reviewed latest lipid panel from 11/2020: LDL above our goal of less than 70: Lab Results  Component Value Date   CHOL 155 12/05/2020   HDL 41 12/05/2020   LDLCALC 94 12/05/2020   LDLDIRECT 113.0 09/09/2020   TRIG 102  12/05/2020   CHOLHDL 3.8 12/05/2020  -She continues on Crestor 10 mg daily.  She does have generalized muscle aches, but she does not feel that these were from the statin.  Philemon Kingdom, MD PhD [facility] Endocrinology

## 2021-08-02 NOTE — Patient Instructions (Addendum)
Please continue: - Tresiba 46 units in am  - Novolog: ICR: 1:15 (carb equivalents) Target: 130 Insulin sensitivity factor: 20 130-150: +1 unit 151-170: +2 units 171-190: +3 units 191-210: +4 units 211-230: +5 units 231-250: +6 units >250: +7 units If you correct sugars at bedtime, only correct >200 and only give 2 to 3 units.  Please add Novolog before coffee: start with 5 units.  Try to switch to the pump.  Please return in 3 months with your sugar log.

## 2021-08-16 ENCOUNTER — Encounter: Payer: Self-pay | Admitting: Family Medicine

## 2021-08-16 ENCOUNTER — Telehealth: Payer: Self-pay

## 2021-08-16 NOTE — Telephone Encounter (Addendum)
Contacted patient to review BG.  I asked her to check BG during call - it was 247 (ate about 30 minutes prior). Upon review of chart, pt saw endo 08/02/21 and BG averages 200-300s during the day. This has been her normal lately. We reviewed Dr. Arman Filter recommendations from 08/03/21.  Encouraged patient to restart her insulin pump. She agrees.  Reminded patient to take 5 units Novolog before coffee in morning. States she has not had any coffee this week yet BG still elevated. She does not think she will get BG under control until headaches improve. Discussed Dexcom CGM. She states she has one, she needs to get all the components together again to restart. She is familiar with use and denies any questions or needs for refills.  Discussed with patient if BG remains above 200, please call Dr. Cruzita Lederer office for follow up.  Debbora Dus, PharmD Clinical Pharmacist [facility] [phone]

## 2021-08-16 NOTE — Progress Notes (Signed)
Called patient back; She took her blood glucose while on the phone with me; her reading was 322. Per Debbora Dus patient took Novolog 15 mg while I was on the phone with her. I advised patient to rest and I would call her back in 30 minutes for her next reading.   Called patient again; while on the phone with me she took her blood glucose reading; it was 269. Patient stated she can feel her blood sugar dropping. Patient is going to eat some peanut butter with a slice of banana and drink water. Debbora Dus will be following up with patient at the end of the day.   Debbora Dus, CPP notified  [name], [place] Clinical Pharmacy Assistant [phone]  Time Spent:  22 Minutes

## 2021-08-16 NOTE — Progress Notes (Signed)
    Chronic Care Management Pharmacy Assistant   Name: Cassandra Benson  MRN: [id number] DOB: September 02, 1941  Reason for Encounter: CCM (Blood Sugar - Incoming Call)   Patient called me with concerns of her blood sugar. Patient states she took her blood sugar at 10:30 am and it was 310.  Patient took [place] 46 units at 10:30 am and Novolog 15 units at 10:45 am. Patient was feeling restless and not feeling well all last night (did not take BG). This morning she woke up feeling nauseas, no energy and not feeling well. That is when she checked her blood sugar and it was 310. I had patient check her sugar while I was on the phone with her at 11:13 her reading was 314. Patient stated she has only had water today. Patient did attend her Aunt's 100th birthday party yesterday and ate cake and ice cream around 12:00 pm. Per Debbora Dus I advised the patient to keep monitoring her glucose. If it is still over 200 she may need another dose of Novolog. Patient stated she is home alone, but she did notify her son she is not feeling well before calling me. I advised patient to drink water and rest. I advised her I would call her back at 12:45 as that would be two hours since her last dose of Novolog. We will take her reading again at that time and go from there.   Debbora Dus, CPP notified  [name], [place] Clinical Pharmacy Assistant [phone]  Time Spent:  16 Minutes

## 2021-08-17 ENCOUNTER — Telehealth: Payer: Self-pay

## 2021-08-17 NOTE — Progress Notes (Addendum)
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 Labs: A1c. No medication changes.   Hospital visits:  None since last CCM contact.   Medications: Outpatient Encounter Medications as of 08/17/2021  Medication Sig   acetaminophen (TYLENOL) 500 MG tablet Take 1 tablet (500 mg total) by mouth 3 (three) times daily as needed.   Ascorbic Acid (VITAMIN C) 100 MG tablet Take 100 mg by mouth daily.   BD ULTRA-FINE LANCETS lancets Use as instructed upto 6 times daily   benzonatate (TESSALON) 100 MG capsule Take 1 capsule (100 mg total) by mouth 3 (three) times daily as needed for cough.   carvedilol (COREG) 6.25 MG tablet Take 1 tablet (6.25 mg total) by mouth 2 (two) times daily with a meal.   Cholecalciferol (VITAMIN D3) 25 MCG (1000 UT) CAPS Take 1 capsule (1,000 Units total) by mouth daily.   clopidogrel (PLAVIX) 75 MG tablet TAKE ONE TABLET BY MOUTH EVERY EVENING   Continuous Blood Gluc Transmit (DEXCOM G6 TRANSMITTER) MISC 1 kit by Does not apply route See admin instructions. Use to check blood sugar   diclofenac Sodium (VOLTAREN) 1 % GEL Apply 2 g topically 3 (three) times daily.   glucagon 1 MG injection Inject 1 mg into the muscle once as needed for up to 1 dose.   glucose blood test strip Use as instructed to check glucose 4 times daily.   Insulin Aspart FlexPen (NOVOLOG) 100 UNIT/ML USE UP TO 50 UNITS DAILY in insulin pump   insulin degludec (TRESIBA FLEXTOUCH) 100 UNIT/ML FlexTouch Pen Inject 40 Units into the skin daily.   Insulin Pen Needle (PEN NEEDLES) 32G X 5 MM MISC Use 1x a day   losartan (COZAAR) 25 MG tablet Take 1 tablet (25 mg total) by mouth daily.  (Patient not taking: Reported on 06/16/2021)   nitroGLYCERIN (NITROSTAT) 0.4 MG SL tablet Place 1 tablet (0.4 mg total) under the tongue every 5 (five) minutes as needed for chest pain.   rosuvastatin (CRESTOR) 20 MG tablet Take 1 tablet (20 mg total) by mouth daily.   SYNTHROID 75 MCG tablet TAKE ONE TABLET BY MOUTH monday-saturday before breakfast   venlafaxine (EFFEXOR) 37.5 MG tablet Take 37.5 mg once a day for a week then increase to 37.5 mg twice a day and continue that dose   No facility-administered encounter medications on file as of 08/17/2021.   BP Readings from Last 3 Encounters:  08/02/21 (!) 110/58  06/16/21 (!) 148/64  05/26/21 134/62    Lab Results  Component Value Date   HGBA1C 10.0 (A) 08/02/2021    Recent OV, Consult or Hospital visit:  No medication changes indicated  Last adherence delivery date: 07/29/2021  Patient is due for next adherence delivery on: 08/27/2021  Spoke with patient on 12/02 reviewed medications and coordinated delivery.    Acute form completed and uploaded for the following medications:  Patient would like delivery on 08/18/2021 Synthroid 75 mcg - 1 tablet daily Monday-Saturday (before breakfast) - 1 tablet remaining Insulin Pen Needles 32 G X 5 MM  - 100 each   The delivery on 08/27/2021 to include: Vials  30 Days  VIAL medications: Carvedilol 6.61m- take 1 tablet 2 times  daily  Tresiba Flextouch U-100 - Inject 46 units daily  Crestor 20 mg - 1 tablet daily (evening meal)   [name] - 1 tablet every evening  Losartan 76m - 1 tablet daily (breakfast) - 14 tablets remaining [name] - Use as directed  Patient declined the following medications this month: Venlafaxine 37.5 mg - 1 tablet twice daily (breakfast and evening meal)  Novolog 100units- Inject up to 50 units daily in pump 10/21- has 2 full boxes on hand  Preservision Areds 2 - 2 capsules daily   Any concerns about your medications? No  How often  do you forget or accidentally miss a dose? Rarely  Do you use a pillbox? Yes  Is patient in packaging No  No refill request needed.  Confirmed delivery dates of  08/18/2021 for her acute fill and 08/27/2021 for her next adherence delivery, advised patient that pharmacy will contact them the morning of delivery.  Recent blood pressure readings are as follows: Patient states she has not taken her blood pressure recently.   Recent blood glucose readings are as follows: 08/17/2021 - 151 (Fasting) 08/16/2021 - 247 (ate 30 minutes prior)  Annual wellness visit in last year? Yes 09/09/2020 Most Recent BP reading: 110/58 on 08/02/2021  If Diabetic: Most recent A1C reading: 10.0 on 08/02/2021 Last eye exam / retinopathy screening: Up to date Last diabetic foot exam: Up to date  MDebbora Dus CPP notified  AMarijean Niemann RCheathamAssistant [phone] I have reviewed the care management and care coordination activities outlined in this encounter and I am certifying that I agree with the content of this note. No further action required.  MDebbora Dus PharmD Clinical Pharmacist [phone]

## 2021-08-17 NOTE — Progress Notes (Signed)
    Chronic Care Management Pharmacy Assistant   Name: Cassandra Benson  MRN: [id number] DOB: 1940-11-08  Reason for Encounter: CCM (Synthroid - My Dina Rich - Patient Assistance)   Spoke with patient and she would like to get Synthroid through patient assistance. Patient assistance forms completed and uploaded. Patient would like them e-mail to her at [email or web address].  Patient will then mail forms back to [facility] location.  Debbora Dus, CPP notified  [name], [place] Clinical Pharmacy Assistant [phone]  Time Spent:  30 Minutes

## 2021-08-25 NOTE — Telephone Encounter (Signed)
Application reviewed and sent to patient's email address.

## 2021-08-26 ENCOUNTER — Telehealth: Payer: Self-pay

## 2021-08-26 DIAGNOSIS — E1169 Type 2 diabetes mellitus with other specified complication: Secondary | ICD-10-CM

## 2021-08-26 DIAGNOSIS — E785 Hyperlipidemia, unspecified: Secondary | ICD-10-CM

## 2021-08-26 MED ORDER — TRESIBA FLEXTOUCH 100 UNIT/ML ~~LOC~~ SOPN: 46.0000 [IU] | PEN_INJECTOR | Freq: Every day | SUBCUTANEOUS | 0 refills | Status: DC

## 2021-08-26 MED ORDER — TRESIBA FLEXTOUCH 100 UNIT/ML ~~LOC~~ SOPN: 40.0000 [IU] | PEN_INJECTOR | Freq: Every day | SUBCUTANEOUS | 0 refills | Status: DC

## 2021-08-26 NOTE — Progress Notes (Addendum)
    Chronic Care Management Pharmacy Assistant   Name: Cassandra Benson  MRN: [id number] DOB: Aug 05, 1941   Reason for Encounter: CCM [name] Refill)  Patient call [facility] requesting a refill of her [name] 46 Units daily. Patient would like medication included in her delivery tomorrow (12/02). I called Dr. Arman Filter office and requested a refill be sent to [facility]. I spoke with Mardene Celeste who was going to request it as urgent. I informed the pharmacy. Called patient to let her know we received her message and we have requested the medication.   Patient had several concerns: -- Patient states she is loosing her hair like crazy. She has lost almost all of her eyebrows. Patient feels it could be due to Losartan as it happened to her sister. When her sister went off of the medication she got her hair back. Patient would like to discuss this side effect.  -- Patient also stated she is getting out of breath easily. She is getting exerted while vacuuming and doing normal stuff that should not make her winded. Patient walks with her friend at least twice a week for at least a mile so she feels like doing normal activities should not make her winded. She states she get a pain in her right shoulder that goes down to the tissue when she gets exerted. She is going to ask her children to get her a stationary bike for christmas so she can still exercise in the winter. Patient was walking around the house while on the phone with me and I could hear the shortness of breath. Patient feels like something is going on. She doesn't feel the way she thinks she should. It has been happening for three weeks.  -- Patient also states her memory is giving her issues.  -- Patient states she would like to apply for patient assistance for Tresiba and Insulin Aspart Flexpen. Patient would like forms mailed to her. Patient states she received a letter from [employer] that she will not have to pay for insulin  anymore. Patient is looking for the letter. Patient is going to call [employer] to get the information and she will call me back with information.   Patient reported her blood sugar this morning was 186 (fasting).   Patient is going to send Dr. Danise Mina a Mychart message about loosing her hair and shortness of breath.   Debbora Dus, CPP notified  [name], [place] Clinical Pharmacy Assistant [phone]  I have reviewed the care management and care coordination activities outlined in this encounter and I am certifying that I agree with the content of this note. Hair loss is not a known side effect of losartan. However, if concerned, could try switching to a different BP medication. Pt to discuss with PCP.  Debbora Dus, PharmD Clinical Pharmacist [facility] [facility] [phone]

## 2021-08-26 NOTE — Addendum Note (Signed)
Addended by: Lauralyn Primes on: 08/26/2021 04:16 PM   Modules accepted: Orders

## 2021-08-26 NOTE — Telephone Encounter (Signed)
Cassandra Benson with [facility] is calling requesting a refill on Tresiba. Stated that [employer] goes out today.

## 2021-08-26 NOTE — Addendum Note (Signed)
Addended by: Lauralyn Primes on: 08/26/2021 02:49 PM   Modules accepted: Orders

## 2021-08-26 NOTE — Telephone Encounter (Signed)
Rx sent to preferred pharmacy.

## 2021-08-26 NOTE — Telephone Encounter (Signed)
Rx corrected in previous encounter.

## 2021-08-26 NOTE — Telephone Encounter (Signed)
Patient has a conflict with her [name]  med dosing & rx that was sent. She has a shipment going out today.    Please call Amy [phone] with Cassandra Benson

## 2021-08-27 ENCOUNTER — Telehealth: Payer: Self-pay

## 2021-08-27 NOTE — Progress Notes (Signed)
Patient assistance forms for Tresiba and Insulin printed and mailed to the patients address with instructions.    Debbora Dus, CPP notified  [name], [place] Assistant [phone]  Total time spent for month CPA: 10 min.

## 2021-08-27 NOTE — Progress Notes (Signed)
    Chronic Care Management Pharmacy Assistant   Name: Cassandra Benson  MRN: [id number] DOB: 1941-09-24  Reason for Encounter: CCM [name] and Insulin Aspart Patient Assistance)   Patient would like apply for Tresiba and Insulin Aspart patient assistance. Both medications are through [employer]. Patient is being sent one PAP form for both medications. Blank form has been uploaded for printing and mailing. Patient would like forms mailed to her house.   Debbora Dus, CPP notified  [name], [place] Clinical Pharmacy Assistant [phone]  Time Spent:  10 Minutes

## 2021-08-31 NOTE — Telephone Encounter (Signed)
Applications resent to [email or web address]  [id number]$, PharmD Clinical Pharmacist Practitioner [place] [facility] at [facility] [phone]

## 2021-08-31 NOTE — Progress Notes (Signed)
Spoke with patient. Patient did not receive an e-mail with her patient assistance forms for Synthroid Dina Rich). Please resend the e-mail to [email or web address]. Patient will return the forms at her appointment on 09/15/2021 with Dr. Danise Mina.   Debbora Dus, CPP notified  [name], [place] Clinical Pharmacy Assistant [phone]  Time Spent:  6 Minutes

## 2021-09-01 ENCOUNTER — Encounter: Payer: Self-pay | Admitting: *Deleted

## 2021-09-08 ENCOUNTER — Other Ambulatory Visit: Payer: Self-pay

## 2021-09-08 ENCOUNTER — Encounter: Payer: Self-pay | Admitting: Diagnostic Neuroimaging

## 2021-09-08 ENCOUNTER — Telehealth: Payer: Self-pay

## 2021-09-08 ENCOUNTER — Ambulatory Visit: Payer: HMO | Admitting: Diagnostic Neuroimaging

## 2021-09-08 VITALS — BP 126/84 | HR 97 | Ht 63.0 in | Wt 144.0 lb

## 2021-09-08 DIAGNOSIS — G43109 Migraine with aura, not intractable, without status migrainosus: Secondary | ICD-10-CM

## 2021-09-08 MED ORDER — RIZATRIPTAN BENZOATE 10 MG PO TBDP: 10.0000 mg | ORAL_TABLET | ORAL | 11 refills | Status: DC | PRN

## 2021-09-08 MED ORDER — TOPIRAMATE 50 MG PO TABS: 50.0000 mg | ORAL_TABLET | Freq: Two times a day (BID) | ORAL | 12 refills | Status: DC

## 2021-09-08 MED ORDER — UBRELVY 50 MG PO TABS: 50.0000 mg | ORAL_TABLET | ORAL | 6 refills | Status: DC | PRN

## 2021-09-08 NOTE — Telephone Encounter (Signed)
Ubrelvy approved through 09/08/2022. Approval letter faxed to pharmacy.

## 2021-09-08 NOTE — Progress Notes (Signed)
[facility]  PATIENT: Cassandra Benson DOB: 07/03/1941  REFERRING CLINICIAN: [name], MD HISTORY FROM: patient  REASON FOR VISIT: new consult    HISTORICAL  CHIEF COMPLAINT:  Chief Complaint  Patient presents with   New Patient (Initial Visit)    Rm 6 alone here for consult on worsening daily headaches.  Pt reports associated facial numbness when h/a are at the worst.     HISTORY OF PRESENT ILLNESS:   80 year old female here for evaluation of headaches.  2017 patient had onset of right-sided headaches, right ear pain, had work-up for temporal arteritis.  Temporal artery biopsy was negative.  Currently patient having right-sided headaches lasting hours at a time, throbbing sensation, mild photophobia, phonophobia, 5 times a week.  No nausea or vomiting.  She has some blurred vision and visual aura with some of these headaches.  No prior history of migraine.  She has been trying some over-the-counter medicines without relief.   REVIEW OF SYSTEMS: Full 14 system review of systems performed and negative with exception of: as per HPI.  ALLERGIES: Allergies  Allergen Reactions   Iodinated Diagnostic Agents Other (See Comments)    Itching (severe) and chest tightness   Penicillins Anaphylaxis, Swelling, Rash and Other (See Comments)    Has patient had a PCN reaction causing immediate rash, facial/tongue/throat swelling, SOB or lightheadedness with hypotension: Yes Has patient had a PCN reaction causing severe rash involving mucus membranes or skin necrosis: yes - remotely Has patient had a PCN reaction that required hospitalization: occurred while hospitalized Has patient had a PCN reaction occurring within the last 10 years: No If all of the above answers are "NO", then may proceed with Cephalosporin use.    Amlodipine Swelling    Pedal edema   Anectine [Succinylcholine] Other (See Comments)    Brother went into cardiac arrest.   Codeine  Nausea Only   Gabapentin Other (See Comments)    Gait abnormality   Influenza Vaccines Other (See Comments)    Muscle weakness; unable to walk   Nortriptyline Other (See Comments)    Eye swelling and mouth drawed up   Pamelor [Nortriptyline Hcl] Other (See Comments)    Patient states caused her face to draw in together.   Valsartan Other (See Comments) and Cough    Allergy to generic only, "Hacking" cough   Zetia [Ezetimibe] Other (See Comments)    Bad muscle cramps   Erythromycin Rash and Swelling   Sulfa Antibiotics Rash    HOME MEDICATIONS: Outpatient Medications Prior to Visit  Medication Sig Dispense Refill   acetaminophen (TYLENOL) 500 MG tablet Take 1 tablet (500 mg total) by mouth 3 (three) times daily as needed.     Ascorbic Acid (VITAMIN C) 100 MG tablet Take 100 mg by mouth daily.     BD ULTRA-FINE LANCETS lancets Use as instructed upto 6 times daily 600 each 2   benzonatate (TESSALON) 100 MG capsule Take 1 capsule (100 mg total) by mouth 3 (three) times daily as needed for cough. 30 capsule 3   carvedilol (COREG) 6.25 MG tablet Take 1 tablet (6.25 mg total) by mouth 2 (two) times daily with a meal. 60 tablet 3   Cholecalciferol (VITAMIN D3) 25 MCG (1000 UT) CAPS Take 1 capsule (1,000 Units total) by mouth daily. 30 capsule    clopidogrel (PLAVIX) 75 MG tablet TAKE ONE TABLET BY MOUTH EVERY EVENING 30 tablet 6   Continuous Blood Gluc Transmit (DEXCOM G6 TRANSMITTER) MISC 1 kit  by Does not apply route See admin instructions. Use to check blood sugar 1 each 1   diclofenac Sodium (VOLTAREN) 1 % GEL Apply 2 g topically 3 (three) times daily. 100 g 3   glucagon 1 MG injection Inject 1 mg into the muscle once as needed for up to 1 dose. 1 each 12   glucose blood test strip Use as instructed to check glucose 4 times daily. 400 each 3   Insulin Aspart FlexPen (NOVOLOG) 100 UNIT/ML USE UP TO 50 UNITS DAILY in insulin pump 50 mL 0   insulin degludec (TRESIBA FLEXTOUCH) 100 UNIT/ML  FlexTouch Pen Inject 46 Units into the skin daily. 36 mL 0   Insulin Pen Needle (PEN NEEDLES) 32G X 5 MM MISC Use 1x a day 100 each 3   losartan (COZAAR) 25 MG tablet Take 1 tablet (25 mg total) by mouth daily. 90 tablet 3   nitroGLYCERIN (NITROSTAT) 0.4 MG SL tablet Place 1 tablet (0.4 mg total) under the tongue every 5 (five) minutes as needed for chest pain. 25 tablet 1   rosuvastatin (CRESTOR) 20 MG tablet Take 1 tablet (20 mg total) by mouth daily. 90 tablet 3   SYNTHROID 75 MCG tablet TAKE ONE TABLET BY MOUTH monday-saturday before breakfast 80 tablet 1   venlafaxine (EFFEXOR) 37.5 MG tablet Take 37.5 mg once a day for a week then increase to 37.5 mg twice a day and continue that dose     No facility-administered medications prior to visit.    PAST MEDICAL HISTORY: Past Medical History:  Diagnosis Date   Acute diverticulitis 04/2021   [facility] ER, CT confirmed   Allergy    ANA positive    positive ANA pattern 1 speckled   Arthritis    Carotid stenosis, asymptomatic 06/19/2015   4-07% RICA 68-08% LICA rpt 1 yr (04/1102)    Colon polyps    Dermatomyositis ([place])    Diabetes mellitus without complication ([place])    Type 1   Diverticulosis    sigmoid on CT scan 12/2019   Family history of adverse reaction to anesthesia    brothr went into cardiac arrest from anectine   Fibromyalgia    prior PCP   GERD (gastroesophageal reflux disease)    prior PCP   Glaucoma    Narrow angle   History of blood clots    DVT, in 20s, none since   History of chicken pox    History of diverticulitis    History of pericarditis 1986   with hospitalization   History of pneumonia 2014   History of shingles    History of UTI    Hyperlipidemia    Hypertension    Hypothyroidism    Mixed connective tissue disease ([place])    Partial small bowel obstruction ([place]) 12/2019   managed conservatively   Peptic ulcer    Pneumonia    PONV (postoperative nausea and vomiting)    Raynaud's disease without gangrene     Shoulder pain left   h/o RTC tendonitis and adhesive capsulitis   Sjogren's syndrome (HCC)    Sleep apnea    prior PCP - no CPAP for about 10 yrs   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[place])    Vitamin D deficiency    prior PCP    PAST SURGICAL HISTORY: Past Surgical History:  Procedure Laterality Date   ABDOMINAL HYSTERECTOMY  1978   fibroids and menorrhagia, ovaries remain   ARTERY BIOPSY Right 04/06/2018   Procedure: BIOPSY TEMPORAL ARTERY RIGHT;  Surgeon: Beverly Gust, MD;  Location: [place];  Service: ENT;  Laterality: Right;  Diabetic - insulin pump sleep apnea   [facility] normal per patient   COLONOSCOPY  10/2011   1 TA, 1 HP, very tortuous colon ([place])   COLONOSCOPY  02/2020   TA, inflammatory polyp, mod diverticulosis, int/ext hemorrhoids ([place]) no rpt recommended    COLONOSCOPY WITH ESOPHAGOGASTRODUODENOSCOPY (EGD)  03/2007   2 ulcers, benign polyp, rpt 5 yrs [facility])   JOINT REPLACEMENT Right    hip   PARTIAL HIP ARTHROPLASTY  2013   Right hip replacement   TONSILLECTOMY     TONSILLECTOMY AND ADENOIDECTOMY     TRANSCAROTID ARTERY REVASCULARIZATION  Left 07/23/2018   Procedure: TRANSCAROTID ARTERY REVASCULARIZATION;  Surgeon: Marty Heck, MD;  Location: [facility] OR;  Service: Vascular;  Laterality: Left;   TUBAL LIGATION     VAGINAL DELIVERY     x2, no complications    FAMILY HISTORY: Family History  Problem Relation Age of Onset   CAD Mother 54       MI, aortic valve issues   COPD Mother    Lupus Mother    [name] Mother    Rheum arthritis Mother    CAD Father 77       CABG x2, aortic valve replacement   Stroke Sister    CAD Sister    2 Sister        brain   Lupus Sister    Diabetes Sister    Alcohol abuse Brother    CAD Brother 92       MI   Stroke Brother    Seizures Son    COPD Brother        agent orange   CAD Brother 77       stent   Diabetes Brother     Depression Grandchild    Breast cancer Maternal Aunt    Diabetes Sister    Breast cancer Sister    Breast cancer Maternal Aunt    Stroke Maternal Grandmother    Hypertension Maternal Grandmother    Gallbladder disease Maternal Grandmother    Colon cancer Neg Hx    Esophageal cancer Neg Hx    Rectal cancer Neg Hx    Stomach cancer Neg Hx     SOCIAL HISTORY: Social History   Socioeconomic History   Marital status: Widowed    Spouse name: Not on file   Number of children: 2   Years of education: Not on file   Highest education level: Not on file  Occupational History   Occupation: retired  Tobacco Use   Smoking status: Never   Smokeless tobacco: Never  Vaping Use   Vaping Use: Never used  Substance and Sexual Activity   Alcohol use: No   Drug use: No   Sexual activity: Not on file  Other Topics Concern   Not on file  Social History Narrative   Lives in [place], moved from [place].    Widow - husband decreased 01/2016 of metastatic colon CA   No pets.   Son Taela Charbonneau lives nearby. Daughter lives in [place]. Sister lives 2 blocks away.    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, [employer]   Exercise - limited   Diet - good water, fruits/vegetables daily, limited meat, protein drink every morning  Social Determinants of Health   Financial Resource Strain: Low Risk    Difficulty of Paying Living Expenses: Not very hard  Food Insecurity: No Food Insecurity   Worried About Charity fundraiser in the Last Year: Never true   Ran Out of Food in the Last Year: Never true  Transportation Needs: No Transportation Needs   Lack of Transportation (Medical): No   Lack of Transportation (Non-Medical): No  Physical Activity: Insufficiently Active   Days of Exercise per Week: 7 days   Minutes of Exercise per Session: 20 min  Stress: No Stress Concern Present   Feeling of Stress : Not at all   Social Connections: Not on file  Intimate Partner Violence: Not At Risk   Fear of Current or Ex-Partner: No   Emotionally Abused: No   Physically Abused: No   Sexually Abused: No     PHYSICAL EXAM  GENERAL EXAM/CONSTITUTIONAL: Vitals:  Vitals:   09/08/21 1153  BP: 126/84  Pulse: 97  SpO2: 99%  Weight: 144 lb (65.3 kg)  Height: _0  (1.6 m)   Body mass index is 25.51 kg/m. Wt Readings from Last 3 Encounters:  09/08/21 144 lb (65.3 kg)  08/02/21 157 lb 12.8 oz (71.6 kg)  06/16/21 157 lb 3 oz (71.3 kg)   Patient is in no distress; well developed, nourished and groomed; neck is supple  CARDIOVASCULAR: Examination of carotid arteries is normal; no carotid bruits Regular rate and rhythm, no murmurs Examination of peripheral vascular system by observation and palpation is normal  EYES: Ophthalmoscopic exam of optic discs and posterior segments is normal; no papilledema or hemorrhages No results found.  MUSCULOSKELETAL: Gait, strength, tone, movements noted in Neurologic exam below  NEUROLOGIC: MENTAL STATUS:  MMSE - Mini Mental State Exam 09/09/2020 08/29/2018 08/09/2016  Orientation to time _1 Orientation to Place _2 Registration _3 Attention/ Calculation 5 0 0  Recall _4 Language- name 2 objects - 0 0  Language- repeat _5 Language- follow 3 step command - 3 3  Language- read & follow direction - 0 0  Write a sentence - 0 0  Copy design - 0 0  Total score - 20 20   awake, alert, oriented to person, place and time recent and remote memory intact normal attention and concentration language fluent, comprehension intact, naming intact fund of knowledge appropriate  CRANIAL NERVE:  2nd - no papilledema on fundoscopic exam 2nd, 3rd, 4th, 6th - pupils equal and reactive to light, visual fields full to confrontation, extraocular muscles intact, no nystagmus 5th - facial sensation symmetric 7th - facial strength symmetric 8th - hearing  intact 9th - palate elevates symmetrically, uvula midline 11th - shoulder shrug symmetric 12th - tongue protrusion midline  MOTOR:  normal bulk and tone, full strength in the BUE, BLE  SENSORY:  normal and symmetric to light touch, temperature, vibration  COORDINATION:  finger-nose-finger, fine finger movements normal  REFLEXES:  deep tendon reflexes TRACE and symmetric  GAIT/STATION:  narrow based gait     DIAGNOSTIC DATA (LABS, IMAGING, TESTING) - I reviewed patient records, labs, notes, testing and imaging myself where available.  Lab Results  Component Value Date   WBC 10.0 05/22/2021   HGB 13.3 05/22/2021   HCT 40.8 05/22/2021   MCV 85.2 05/22/2021   PLT 217 05/22/2021      Component Value Date/Time   NA 136 05/22/2021 1623  NA 141 01/11/2016 0000   K 3.6 05/22/2021 1623   K 4.2 01/11/2016 0000   CL 100 05/22/2021 1623   CO2 27 05/22/2021 1623   GLUCOSE 308 (H) 05/22/2021 1623   BUN 14 05/22/2021 1623   CREATININE 0.83 05/22/2021 1623   CREATININE 0.89 01/11/2016 0000   CALCIUM 9.3 05/22/2021 1623   PROT 7.2 11/19/2020 1215   ALBUMIN 3.8 11/19/2020 1215   ALBUMIN 4.1 01/11/2016 0000   AST 21 11/19/2020 1215   AST 24 01/11/2016 0000   ALT 14 11/19/2020 1215   ALT 21 01/11/2016 0000   ALKPHOS 81 11/19/2020 1215   ALKPHOS 74 01/11/2016 0000   BILITOT 0.8 11/19/2020 1215   BILITOT 0.3 01/11/2016 0000   GFRNONAA >60 05/22/2021 1623   GFRAA >60 03/05/2020 2208   Lab Results  Component Value Date   CHOL 155 12/05/2020   HDL 41 12/05/2020   LDLCALC 94 12/05/2020   LDLDIRECT 113.0 09/09/2020   TRIG 102 12/05/2020   CHOLHDL 3.8 12/05/2020   Lab Results  Component Value Date   HGBA1C 10.0 (A) 08/02/2021   Lab Results  Component Value Date   XTKWIOXB35 329 10/22/2019   Lab Results  Component Value Date   TSH 2.16 09/09/2020    01/07/21 MRI brain 1. No acute intracranial abnormality. 2. Findings of chronic ischemic microangiopathy and  generalized volume loss without a clear lobar predilection.  03/09/21 carotid u/s Right Carotid: Velocities in the right ICA are consistent with a 1-39%  stenosis.   Left Carotid: Patent stent.   Vertebrals:  Bilateral vertebral arteries demonstrate antegrade flow.  Subclavians: Normal flow hemodynamics were seen in bilateral subclavian               arteries.     ASSESSMENT AND PLAN  80 y.o. year old female here with right-sided headaches since 2017 with unremarkable MRI and a right temporal artery biopsy.  May represent migraine with aura.   Dx:  1. Migraine with aura and without status migrainosus, not intractable      PLAN:  MIGRAINE WITH AURA  MIGRAINE PREVENTION  LIFESTYLE CHANGES -Stop or avoid smoking -Decrease or avoid caffeine / alcohol -Eat and sleep on a regular schedule -Exercise several times per week - start topiramate 71m at bedtime; after 1-2 weeks increase to 54mtwice a day; drink plenty of water  MIGRAINE RESCUE  - ibuprofen, tylenol as needed - avoid triptans (due to coronary dz and carotid stent) - ubrelvy 5041maily as needed for migraine attacks  Meds ordered this encounter  Medications   topiramate (TOPAMAX) 50 MG tablet    Sig: Take 1 tablet (50 mg total) by mouth 2 (two) times daily.    Dispense:  60 tablet    Refill:  12   DISCONTD: rizatriptan (MAXALT-MLT) 10 MG disintegrating tablet    Sig: Take 1 tablet (10 mg total) by mouth as needed for migraine. May repeat in 2 hours if needed    Dispense:  9 tablet    Refill:  11   Ubrogepant (UBRELVY) 50 MG TABS    Sig: Take 50 mg by mouth as needed. May repeat x 1 tab after 2 hours; max 2 tabs per day or 8 per month    Dispense:  8 tablet    Refill:  6   Return in about 6 months (around 03/09/2022).    VIKPenni BombardD 12/92/42/[id number]:19:[id number] Certified in Neurology, Neurophysiology and Neuroimaging  [facility] [phone]  [address], [place], [place]  [address] [phone]

## 2021-09-08 NOTE — Patient Instructions (Addendum)
MIGRAINE PREVENTION  LIFESTYLE CHANGES -Stop or avoid smoking -Decrease or avoid caffeine / alcohol -Eat and sleep on a regular schedule -Exercise several times per week - start topiramate 50mg  at bedtime; after 1-2 weeks increase to 50mg  twice a day; drink plenty of water   MIGRAINE RESCUE  - ibuprofen, tylenol as needed - avoid triptans (due to coronary dz and carotid stent) --> do not take rizatriptan - ubrelvy 50mg  daily as needed for migraine attacks

## 2021-09-08 NOTE — Telephone Encounter (Signed)
PA for Roselyn Meier has been notified. (Key: [id number])  This request is still being processed. You may close this dialog, return to your dashboard, and perform other tasks. To check for an update later, open this request again from your dashboard. If you have any questions, please contact [employer] at [phone].

## 2021-09-10 ENCOUNTER — Other Ambulatory Visit: Payer: Self-pay

## 2021-09-10 ENCOUNTER — Emergency Department
Admission: EM | Admit: 2021-09-10 | Discharge: 2021-09-10 | Disposition: A | Discharge status: Home / Self Care | Payer: HMO | Attending: Emergency Medicine | Admitting: Emergency Medicine

## 2021-09-10 ENCOUNTER — Encounter: Payer: Self-pay | Admitting: Emergency Medicine

## 2021-09-10 ENCOUNTER — Emergency Department: Payer: HMO

## 2021-09-10 ENCOUNTER — Telehealth: Payer: Self-pay

## 2021-09-10 DIAGNOSIS — R0781 Pleurodynia: Secondary | ICD-10-CM | POA: Diagnosis not present

## 2021-09-10 DIAGNOSIS — Z131 Encounter for screening for diabetes mellitus: Secondary | ICD-10-CM | POA: Diagnosis not present

## 2021-09-10 DIAGNOSIS — J189 Pneumonia, unspecified organism: Secondary | ICD-10-CM | POA: Diagnosis not present

## 2021-09-10 DIAGNOSIS — R059 Cough, unspecified: Secondary | ICD-10-CM | POA: Insufficient documentation

## 2021-09-10 DIAGNOSIS — R0602 Shortness of breath: Secondary | ICD-10-CM | POA: Diagnosis not present

## 2021-09-10 DIAGNOSIS — Z5321 Procedure and treatment not carried out due to patient leaving prior to being seen by health care provider: Secondary | ICD-10-CM | POA: Insufficient documentation

## 2021-09-10 DIAGNOSIS — R52 Pain, unspecified: Secondary | ICD-10-CM | POA: Diagnosis not present

## 2021-09-10 LAB — CBC
HCT: 42.4 % (ref 36.0–46.0)
Hemoglobin: 13.7 g/dL (ref 12.0–15.0)
MCH: 27 pg (ref 26.0–34.0)
MCHC: 32.3 g/dL (ref 30.0–36.0)
MCV: 83.5 fL (ref 80.0–100.0)
Platelets: 205 10*3/uL (ref 150–400)
RBC: 5.08 MIL/uL (ref 3.87–5.11)
RDW: 14.1 % (ref 11.5–15.5)
WBC: 7.2 10*3/uL (ref 4.0–10.5)
nRBC: 0 % (ref 0.0–0.2)

## 2021-09-10 LAB — BASIC METABOLIC PANEL
Anion gap: 5 (ref 5–15)
BUN: 11 mg/dL (ref 8–23)
CO2: 28 mmol/L (ref 22–32)
Calcium: 9.3 mg/dL (ref 8.9–10.3)
Chloride: 103 mmol/L (ref 98–111)
Creatinine, Ser: 0.78 mg/dL (ref 0.44–1.00)
GFR, Estimated: >60 mL/min (ref >60)
Glucose, Bld: 253 mg/dL — ABNORMAL HIGH (ref 70–99)
Potassium: 4 mmol/L (ref 3.5–5.1)
Sodium: 136 mmol/L (ref 135–145)

## 2021-09-10 LAB — TROPONIN I (HIGH SENSITIVITY)
Troponin I (High Sensitivity): 4 ng/L (ref <18)
Troponin I (High Sensitivity): 5 ng/L (ref <18)

## 2021-09-10 LAB — CBG MONITORING, ED: Glucose-Capillary: 203 mg/dL — ABNORMAL HIGH (ref 70–99)

## 2021-09-10 LAB — BRAIN NATRIURETIC PEPTIDE: B Natriuretic Peptide: 71.8 pg/mL (ref 0.0–100.0)

## 2021-09-10 NOTE — ED Triage Notes (Signed)
First RN Note: pt to ED via [facility] from home with c/o increasing SOB, per EMS pt was dx via telephone with pneumonia and has had increasing cough x several days. Per EMS pt c/o increasing rib pain due to cough.    128/78 86HR  99% RA

## 2021-09-10 NOTE — ED Triage Notes (Signed)
Pt via EMS from home. Pt c/o SOB and cough since Wednesday. Pt has a hx of CHF. Endorses pain in he ribs and back when she coughs. Pt is A&Ox4 and NAD.

## 2021-09-10 NOTE — ED Notes (Addendum)
Pt informed staff of wanting to leave.  Pt advised of risks of leaving before further tx.  Pt left anyway.  Pt ambulatory to door at this time.  NAD noted.

## 2021-09-10 NOTE — Telephone Encounter (Signed)
[facility] - Client TELEPHONE ADVICE RECORD [facility] Patient Name: Cassandra Benson Gender: Female DOB: [birth year] Age: 80 Y 2 M 14 D Return Phone Number: [phone] (Primary), [phone] (Secondary) Address: City/ State/ Zip: [place] [address] Client [name] - Client Client Site [place] Provider [name] - MD Contact Type Call Who Is Calling Patient / Member / Family / Caregiver Call Type Triage / Clinical Relationship To Patient Self Return Phone Number [phone] (Secondary) Chief Complaint BREATHING - shortness of breath or sounds breathless Reason for Call Symptomatic / Request for [name] states she has a patient on the line who states that she has cold like symptoms but every time she goes to breath it hurts her back. Has been occurring since, Wednesday. Sounds Breathless or short of breath. Translation No Nurse Assessment Nurse: Glean Salvo, RN, Magda Paganini Date/Time Eilene Ghazi Time): 09/10/2021 11:00:09 AM Confirm and document reason for call. If symptomatic, describe symptoms. ---Caller states that she is having cold like sx for two days. She is short of breath and she has pain with deep breathing. Does the patient have any new or worsening symptoms? ---Yes Will a triage be completed? ---Yes Related visit to physician within the last 2 weeks? ---No Does the PT have any chronic conditions? (i.e. diabetes, asthma, this includes High risk factors for pregnancy, etc.) ---Yes List chronic conditions. ---Lupus, Is this a behavioral health or substance abuse call? ---No Guidelines Guideline Title Affirmed Question Affirmed Notes Nurse Date/Time (Eastern Time) Breathing Difficulty [1] MODERATE difficulty breathing (e.g., speaks in phrases, SOB even at rest, pulse 100-120) AND [2] NEW-onset [name] 09/10/2021  11:01:27 AM PLEASE NOTE: All timestamps contained within this report are represented as [place] Standard Time. CONFIDENTIALTY NOTICE: This fax transmission is intended only for the addressee. It contains information that is legally privileged, confidential or otherwise protected from use or disclosure. If you are not the intended recipient, you are strictly prohibited from reviewing, disclosing, copying using or disseminating any of this information or taking any action in reliance on or regarding this information. If you have received this fax in error, please notify us immediately by telephone so that we can arrange for its return to [place]. Phone: [phone], Toll-Free: [phone], Fax: [phone] Page: 2 of 2 Call Id: [id number] Guidelines Guideline Title Affirmed Question Affirmed Notes Nurse Date/Time Eilene Ghazi Time) or WORSE than normal Disp. Time Eilene Ghazi Time) Disposition Final User 09/10/2021 11:05:26 AM Go to ED Now Yes Glean Salvo, RN, Christa See Disagree/Comply Comply Caller Understands Yes PreDisposition Call Doctor Care Advice Given Per Guideline GO TO ED NOW: * You need to be seen in the Emergency Department. * Go to the ED at ___________ [place] now. Drive carefully. NOTE TO TRIAGER - DRIVING: * Another adult should drive. * Patient should not delay going to the emergency department. CALL EMS 911 IF: * Call EMS if you become worse. CARE ADVICE given per Breathing Difficulty (Adult) guideline. Referrals [facility]

## 2021-09-10 NOTE — Telephone Encounter (Signed)
I spoke with pt and she is presently at [facility] ED. Sending note to Dr Darnell Level and Lattie Haw CMA.

## 2021-09-12 ENCOUNTER — Encounter: Payer: Self-pay | Admitting: Family Medicine

## 2021-09-13 NOTE — Telephone Encounter (Signed)
Pt called to follow up on mychart message

## 2021-09-14 ENCOUNTER — Other Ambulatory Visit (INDEPENDENT_AMBULATORY_CARE_PROVIDER_SITE_OTHER): Payer: HMO | Admitting: Family Medicine

## 2021-09-14 ENCOUNTER — Other Ambulatory Visit: Payer: Self-pay

## 2021-09-14 ENCOUNTER — Telehealth (INDEPENDENT_AMBULATORY_CARE_PROVIDER_SITE_OTHER): Payer: HMO | Admitting: Family Medicine

## 2021-09-14 ENCOUNTER — Encounter: Payer: Self-pay | Admitting: Family Medicine

## 2021-09-14 VITALS — BP 121/65 | HR 67 | Temp 97.8°F | Ht 63.0 in | Wt 150.0 lb

## 2021-09-14 DIAGNOSIS — U071 COVID-19: Secondary | ICD-10-CM | POA: Insufficient documentation

## 2021-09-14 DIAGNOSIS — R079 Chest pain, unspecified: Secondary | ICD-10-CM

## 2021-09-14 DIAGNOSIS — R051 Acute cough: Secondary | ICD-10-CM

## 2021-09-14 DIAGNOSIS — R519 Headache, unspecified: Secondary | ICD-10-CM

## 2021-09-14 DIAGNOSIS — J22 Unspecified acute lower respiratory infection: Secondary | ICD-10-CM | POA: Diagnosis not present

## 2021-09-14 HISTORY — DX: COVID-19: U07.1

## 2021-09-14 LAB — POC COVID19 BINAXNOW: SARS Coronavirus 2 Ag: POSITIVE — AB

## 2021-09-14 LAB — POC INFLUENZA A&B (BINAX/QUICKVUE)
Influenza A, POC: NEGATIVE
Influenza B, POC: NEGATIVE

## 2021-09-14 NOTE — Assessment & Plan Note (Signed)
Appreciate second opinion by Dr Leta Baptist, treating for possible migraine with preventative topamax and abortive ubrelvy.

## 2021-09-14 NOTE — Progress Notes (Signed)
Ordering curbside swabs for today's virtual visit to be done at [facility] this afternoon.

## 2021-09-14 NOTE — Progress Notes (Addendum)
Patient ID: Cassandra Benson, female    DOB: 1940-12-18, 80 y.o.   MRN: [id number]  Virtual visit completed through [place], a video enabled telemedicine application. Due to national recommendations of social distancing due to COVID-19, a virtual visit is felt to be most appropriate for this patient at this time. Reviewed limitations, risks, security and privacy concerns of performing a virtual visit and the availability of in person appointments. I also reviewed that there may be a patient responsible charge related to this service. The patient agreed to proceed.   Patient location: home Provider location: [place] [facility], office Persons participating in this virtual visit: patient, provider   If any vitals were documented, they were collected by patient at home unless specified below.   BP 121/65    Pulse 67    Temp 97.8 F (36.6 C)    Ht _0  (1.6 m)    Wt 150 lb (68 kg)    BMI 26.57 kg/m    CC: ER f/u visit  Subjective:   HPI: Cassandra Benson is a 80 y.o. female presenting on 09/14/2021 for Hospitalization Follow-up (Pt went to [facility] ED on 09/10/21 but left before being seen. C/o SOB and back pain. BS this AM-109.)   See recent mychart message.  Seen at [facility] ER on 09/10/2021 for 2d h/o cold symptoms associated with dyspnea and chest pain with deep breathing. ER workup reviewed including normal CXR and labs including cardiac enzymes and BNP. Glu elevated at 253.   Notes ongoing reproducible right sided chest pain which is more chronic and associated with exertional dyspnea and worsening stamina. She felt the dyspnea had worsened over the last few days. Very hoarse. Productive cough of colored mucous x1 week. Chills but no fevers. Decreased appetite, decreased stamina. Chest > head congestion. No known sick contacts at home. Has not had COVID vaccines.   Recent neurology second opinion evaluation for ongoing intractable R temporal headache - ?migraine variant,  rec topamax 23m BID and Ubrelvy 562mabortively (avoiding triptans in h/o vascular disease). Planned RTC 6 mo.   Last saw cardiology 01/2021 with planned f/u in 1 year.      Relevant past medical, surgical, family and social history reviewed and updated as indicated. Interim medical history since our last visit reviewed. Allergies and medications reviewed and updated. Outpatient Medications Prior to Visit  Medication Sig Dispense Refill   acetaminophen (TYLENOL) 500 MG tablet Take 1 tablet (500 mg total) by mouth 3 (three) times daily as needed.     Ascorbic Acid (VITAMIN C) 100 MG tablet Take 100 mg by mouth daily.     BD ULTRA-FINE LANCETS lancets Use as instructed upto 6 times daily 600 each 2   benzonatate (TESSALON) 100 MG capsule Take 1 capsule (100 mg total) by mouth 3 (three) times daily as needed for cough. 30 capsule 3   carvedilol (COREG) 6.25 MG tablet Take 1 tablet (6.25 mg total) by mouth 2 (two) times daily with a meal. 60 tablet 3   Cholecalciferol (VITAMIN D3) 25 MCG (1000 UT) CAPS Take 1 capsule (1,000 Units total) by mouth daily. 30 capsule    clopidogrel (PLAVIX) 75 MG tablet TAKE ONE TABLET BY MOUTH EVERY EVENING 30 tablet 6   Continuous Blood Gluc Transmit (DEXCOM G6 TRANSMITTER) MISC 1 kit by Does not apply route See admin instructions. Use to check blood sugar 1 each 1   diclofenac Sodium (VOLTAREN) 1 % GEL Apply 2 g topically 3 (  three) times daily. 100 g 3   glucagon 1 MG injection Inject 1 mg into the muscle once as needed for up to 1 dose. 1 each 12   glucose blood test strip Use as instructed to check glucose 4 times daily. 400 each 3   Insulin Aspart FlexPen (NOVOLOG) 100 UNIT/ML USE UP TO 50 UNITS DAILY in insulin pump 50 mL 0   insulin degludec (TRESIBA FLEXTOUCH) 100 UNIT/ML FlexTouch Pen Inject 46 Units into the skin daily. 36 mL 0   Insulin Pen Needle (PEN NEEDLES) 32G X 5 MM MISC Use 1x a day 100 each 3   losartan (COZAAR) 25 MG tablet Take 1 tablet (25 mg  total) by mouth daily. 90 tablet 3   nitroGLYCERIN (NITROSTAT) 0.4 MG SL tablet Place 1 tablet (0.4 mg total) under the tongue every 5 (five) minutes as needed for chest pain. 25 tablet 1   rosuvastatin (CRESTOR) 20 MG tablet Take 1 tablet (20 mg total) by mouth daily. 90 tablet 3   SYNTHROID 75 MCG tablet TAKE ONE TABLET BY MOUTH monday-saturday before breakfast 80 tablet 1   Ubrogepant (UBRELVY) 50 MG TABS Take 50 mg by mouth as needed. May repeat x 1 tab after 2 hours; max 2 tabs per day or 8 per month 8 tablet 6   venlafaxine (EFFEXOR) 37.5 MG tablet Take 37.5 mg once a day for a week then increase to 37.5 mg twice a day and continue that dose     rizatriptan (MAXALT-MLT) 10 MG disintegrating tablet SMARTSIG:1 Tablet(s) By Mouth 1-2 Times Daily (Patient not taking: Reported on 09/14/2021)     topiramate (TOPAMAX) 50 MG tablet Take 1 tablet (50 mg total) by mouth 2 (two) times daily. (Patient not taking: Reported on 09/14/2021) 60 tablet 12   No facility-administered medications prior to visit.     Per HPI unless specifically indicated in ROS section below Review of Systems Objective:  BP 121/65    Pulse 67    Temp 97.8 F (36.6 C)    Ht _0  (1.6 m)    Wt 150 lb (68 kg)    BMI 26.57 kg/m   Wt Readings from Last 3 Encounters:  09/14/21 150 lb (68 kg)  09/08/21 144 lb (65.3 kg)  08/02/21 157 lb 12.8 oz (71.6 kg)       Physical exam: Pulm: speaks in complete sentences without increased work of breathing, sounds congested Psych: normal mood, normal thought content      Results for orders placed or performed in visit on 08/02/21  POCT glycosylated hemoglobin (Hb A1C)  Result Value Ref Range   Hemoglobin A1C 10.0 (A) 4.0 - 5.6 %   HbA1c POC (<> result, manual entry)     HbA1c, POC (prediabetic range)     HbA1c, POC (controlled diabetic range)     Assessment & Plan:   Problem List Items Addressed This Visit     Chest pain    If ongoing, may need sooner return to cardiology  for further evaluation.       Intractable episodic headache    Appreciate second opinion by Dr Leta Baptist, treating for possible migraine with preventative topamax and abortive ubrelvy.       Relevant Medications   rizatriptan (MAXALT-MLT) 10 MG disintegrating tablet   Acute respiratory infection    Longstanding right sided chest pain with both MSK and pleuritic components, however recent worsening dyspnea, cough, congestion suggestive of acute respiratory infection - recommend r/o COVID prior to  coming into office for tomorrow's physical appointment. Will be in touch with results.       COVID-19 virus infection - Primary    ADDENDUM ==> COVID test returned positive. First day of symptoms was 09/08/2021. She is outside of window for anti-viral.  Reviewed expected course of illness, anticipated course of recovery, as well as red flags to suggest COVID pneumonia and/or to seek urgent in-person care.  Reviewed latest CDC isolation/quarantine guidelines.  Encouraged fluids and rest. Reviewed further supportive care measures at home including vit C 537m bid, vit D 2000 IU daily, zinc 1080mdaily, tylenol PRN, pepcid 2072mID PRN. She will also use tessalon perls PRN. Update if not improving over time.  Will reschedule tomorrow's CPE appt for next week.        No orders of the defined types were placed in this encounter.  No orders of the defined types were placed in this encounter.   I discussed the assessment and treatment plan with the patient. The patient was provided an opportunity to ask questions and all were answered. The patient agreed with the plan and demonstrated an understanding of the instructions. The patient was advised to call back or seek an in-person evaluation if the symptoms worsen or if the condition fails to improve as anticipated.  Follow up plan: No follow-ups on file.  JavRia BushD

## 2021-09-14 NOTE — Assessment & Plan Note (Signed)
If ongoing, may need sooner return to cardiology for further evaluation.

## 2021-09-14 NOTE — Assessment & Plan Note (Signed)
Longstanding right sided chest pain with both MSK and pleuritic components, however recent worsening dyspnea, cough, congestion suggestive of acute respiratory infection - recommend r/o COVID prior to coming into office for tomorrow's physical appointment. Will be in touch with results.

## 2021-09-14 NOTE — Telephone Encounter (Signed)
Per Dr. Darnell Level, a [facility] visit is being added today at 12:30 for ER f/u.

## 2021-09-14 NOTE — Assessment & Plan Note (Addendum)
ADDENDUM ==> COVID test returned positive. First day of symptoms was 09/08/2021. She is outside of window for anti-viral.  Reviewed expected course of illness, anticipated course of recovery, as well as red flags to suggest COVID pneumonia and/or to seek urgent in-person care.  Reviewed latest CDC isolation/quarantine guidelines.  Encouraged fluids and rest. Reviewed further supportive care measures at home including vit C 500mg  bid, vit D 2000 IU daily, zinc 100mg  daily, tylenol PRN, pepcid 20mg  BID PRN. She will also use tessalon perls PRN. Update if not improving over time.  Will reschedule tomorrow's CPE appt for next week.

## 2021-09-15 ENCOUNTER — Ambulatory Visit: Payer: HMO | Admitting: Family Medicine

## 2021-09-16 ENCOUNTER — Telehealth: Payer: Self-pay

## 2021-09-16 NOTE — Chronic Care Management (AMB) (Addendum)
Chronic Care Management Pharmacy Assistant   Name: Cassandra Benson  MRN: [id number] DOB: 1941/08/02   Reason for Encounter: Medication Adherence and Delivery Coordination  Recent office visits:  09/14/21-PCP-Telemedicine-Patient presented for positive covid-19-referral for second opinion migraine treatment.Reviewed latest CDC isolation/quarantine guidelines.  Encouraged fluids and rest. Reviewed further supportive care measures at home including vit C 520m bid, vit D 2000 IU daily, zinc 1042mdaily, tylenol PRN, pepcid 2027mID PRN. She will also use tessalon perls PRN.  Recent consult visits:  09/08/21-Neurology-Patient presented for initial visit for migraines.Start Topiramate 5m24m bedtime X 2 weeks, then 5mg76mce a day,drink plenty of water.Ubrelvy  5mg 39m 1 tablet daily as needed for migraine attacks,stop Maxalt  10mg. 4mpital visits:  09/10/21-[facility]-Patient presented for shortness of breath, labs ordered and patient left AMA  Medications: Outpatient Encounter Medications as of 09/16/2021  Medication Sig Note   acetaminophen (TYLENOL) 500 MG tablet Take 1 tablet (500 mg total) by mouth 3 (three) times daily as needed.    Ascorbic Acid (VITAMIN C) 100 MG tablet Take 100 mg by mouth daily.    BD ULTRA-FINE LANCETS lancets Use as instructed upto 6 times daily    benzonatate (TESSALON) 100 MG capsule Take 1 capsule (100 mg total) by mouth 3 (three) times daily as needed for cough.    carvedilol (COREG) 6.25 MG tablet Take 1 tablet (6.25 mg total) by mouth 2 (two) times daily with a meal.    Cholecalciferol (VITAMIN D3) 25 MCG (1000 UT) CAPS Take 1 capsule (1,000 Units total) by mouth daily.    clopidogrel (PLAVIX) 75 MG tablet TAKE ONE TABLET BY MOUTH EVERY EVENING    Continuous Blood Gluc Transmit (DEXCOM G6 TRANSMITTER) MISC 1 kit by Does not apply route See admin instructions. Use to check blood sugar    diclofenac Sodium (VOLTAREN) 1 % GEL Apply 2 g topically 3  (three) times daily.    glucagon 1 MG injection Inject 1 mg into the muscle once as needed for up to 1 dose.    glucose blood test strip Use as instructed to check glucose 4 times daily.    Insulin Aspart FlexPen (NOVOLOG) 100 UNIT/ML USE UP TO 50 UNITS DAILY in insulin pump    insulin degludec (TRESIBA FLEXTOUCH) 100 UNIT/ML FlexTouch Pen Inject 46 Units into the skin daily.    Insulin Pen Needle (PEN NEEDLES) 32G X 5 MM MISC Use 1x a day    losartan (COZAAR) 25 MG tablet Take 1 tablet (25 mg total) by mouth daily.    nitroGLYCERIN (NITROSTAT) 0.4 MG SL tablet Place 1 tablet (0.4 mg total) under the tongue every 5 (five) minutes as needed for chest pain.    rizatriptan (MAXALT-MLT) 10 MG disintegrating tablet SMARTSIG:1 Tablet(s) By Mouth 1-2 Times Daily (Patient not taking: Reported on 09/14/2021)    rosuvastatin (CRESTOR) 20 MG tablet Take 1 tablet (20 mg total) by mouth daily.    SYNTHROID 75 MCG tablet TAKE ONE TABLET BY MOUTH monday-saturday before breakfast    topiramate (TOPAMAX) 50 MG tablet Take 1 tablet (50 mg total) by mouth 2 (two) times daily. (Patient not taking: Reported on 09/14/2021)    Ubrogepant (UBRELVY) 50 MG TABS Take 50 mg by mouth as needed. May repeat x 1 tab after 2 hours; max 2 tabs per day or 8 per month 09/08/2021: 09/08/21 approved thru 09/08/2022   venlafaxine (EFFEXOR) 37.5 MG tablet Take 37.5 mg once a day for a week then increase  to 37.5 mg twice a day and continue that dose    No facility-administered encounter medications on file as of 09/16/2021.   BP Readings from Last 3 Encounters:  09/14/21 121/65  09/08/21 126/84  08/02/21 (!) 110/58    Lab Results  Component Value Date   HGBA1C 10.0 (A) 08/02/2021      Recent OV, Consult or Hospital visit: 09/08/21-Neurology-stop maxalt, start topamax 67m,ubrelvy 556mfor as needed   Last adherence delivery date:08/27/21      Patient is due for next adherence delivery on: 09/28/2021  Spoke with patient on  09/16/21 reviewed medications and coordinated delivery.  This delivery to include: Vials  30 Days No safety caps VIAL medications: Carvedilol 6.2556mtake 1 tablet 2 times daily  Crestor 20 mg - 1 tablet daily (evening meal)   [name] - 1 tablet every evening  Losartan 34m53m1 tablet daily (breakfast)  Synthroid 75 mcg - 1 tablet daily Monday-Saturday (before breakfast) (acute fill request submitted)(2 pills left)   Patient declined the following medications this month: Topiramate 50mg58mke 1 tablet twice daily  (patient has supply on hand) Insulin Pen Needles 32 G X 5 MM  - 100 each (supply on hand) Tresiba Flextouch U-100 - Inject 46 units daily ( 3 vials on hand) [name] - Use as directed ( supply on hand) Venlafaxine 37.5 mg - 1 tablet twice daily (breakfast and evening meal) (supply on hand) Novolog 100units- Inject up to 50 units daily in pump (supply on hand) Rizatriptan 10mg-21me 1 tablet onset of headache, may repeat 2 hours later(stopped taking)  Any concerns about your medications? No  How often do you forget or accidentally miss a dose? Rarely  Do you use a pillbox? Yes  Is patient in packaging? No - tried it but did not like it   No refill request needed.  Confirmed delivery date of 09/28/21, advised patient that pharmacy will contact them the morning of delivery.  Recent blood pressure readings are as follows:  None available   Recent blood glucose readings are as follows: Fasting:  09/16/21  -128   Annual wellness visit in last year? Yes Most Recent BP reading:126/84  97-P  09/08/21  If Diabetic: Most recent A1C reading:10.0 08/02/21 Last eye exam / retinopathy screening:11/04/20 Last diabetic foot exam: 03/16/21  MichelDebbora Dusnotified  VelmenAvel Sensor CBlacksheartant [phone] reviewed the care management and care coordination activities outlined in this encounter and I am certifying that I  agree with the content of this note. No further action required.  MichelDebbora DusmD Clinical Pharmacist [phone]

## 2021-09-20 ENCOUNTER — Other Ambulatory Visit: Payer: Self-pay | Admitting: Family Medicine

## 2021-09-21 ENCOUNTER — Encounter: Payer: Self-pay | Admitting: Family Medicine

## 2021-09-21 NOTE — Progress Notes (Deleted)
Patient ID: Cassandra Benson, female    DOB: 1940-12-05, 80 y.o.   MRN: [id number]  This visit was conducted in person.  There were no vitals taken for this visit.   CC: AMW/CPE Subjective:   HPI: Cassandra Benson is a 80 y.o. female presenting on 09/22/2021 for No chief complaint on file.   Did not see health advisor this year.   No results found.  [place] Office Visit from 10/07/2020 in [place] at [place]  PHQ-2 Total Score 0       Fall Risk  10/07/2020 09/09/2020 01/13/2020 09/13/2019 08/29/2018  Falls in the past year? 0 '1 1 1 ' 0  Comment - tripped and fell - - -  Number falls in past yr: 0 0 0 0 -  Comment - - fell in yard, bruised hip - -  Injury with Fall? 0 '1 1 1 ' -  Risk for fall due to : - Medication side effect - - -  Follow up Falls evaluation completed Falls evaluation completed;Falls prevention discussed - - -   Recent COVID infection - symptoms started 09/08/2021.   Established with [facility] Dr Leta Baptist for chronic headache   Preventative: COLONOSCOPY 12-12-11 - 1 TA, 1 HP, very tortuous colon ([place] [facility]) Colonoscopy (02/2020) - TA, inflammatory polyp, mod diverticulosis, int/ext hemorrhoids ([place]) no rpt recommended  Breast cancer screening - mammo 02/2020 Bir[facility] @ [facility]. Does self breast exams at home.  Well woman exam - last 12-12-11 s/p abd hysterectomy, ovaries remain. Denies pelvic symptoms DEXA scan - normal per patient 04/2012.  Flu shot - ALLERGIC COVID vaccine - discussed, declines - hesitant due to poor reactions to previous vaccines.  Tdap 07/2012.  Pneumovax 2012-12-11, prevnar 12-11-2013.  Shingrix - declines. Has had shingles.  Advanced directive: forms were scanned (08/2017) - HCPOA are son Jazalyn Mondor and [name]. Does not want prolonged life support if terminal condition. HCPOA has authority to override directives.  Seat belt use discussed.  Sunscreen use discussed, no changing moles on skin  Non  smoker Alcohol - none Dentist q4 mo - rec more frequent due to sjogren's Eye exam yearly - stopped going to [facility] - wants to see [place] - no constipation  Bladder - some stress incontinence symptoms   Lives in [place] now. Recently moved from [place]. Husband deceased mid 2015/12/12. No pets.  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 Diet - good water, fruits/vegetables daily, limited meat, protein drink every morning      Relevant past medical, surgical, family and social history reviewed and updated as indicated. Interim medical history since our last visit reviewed. Allergies and medications reviewed and updated. Outpatient Medications Prior to Visit  Medication Sig Dispense Refill   acetaminophen (TYLENOL) 500 MG tablet Take 1 tablet (500 mg total) by mouth 3 (three) times daily as needed.     Ascorbic Acid (VITAMIN C) 100 MG tablet Take 100 mg by mouth daily.     BD ULTRA-FINE LANCETS lancets Use as instructed upto 6 times daily 600 each 2   benzonatate (TESSALON) 100 MG capsule Take 1 capsule (100 mg total) by mouth 3 (three) times daily as needed for cough. 30 capsule 3   carvedilol (COREG) 6.25 MG tablet Take 1 tablet (6.25 mg total) by mouth 2 (two)  times daily with a meal. 60 tablet 3   Cholecalciferol (VITAMIN D3) 25 MCG (1000 UT) CAPS Take 1 capsule (1,000 Units total) by mouth daily. 30 capsule    clopidogrel (PLAVIX) 75 MG tablet TAKE ONE TABLET BY MOUTH EVERY EVENING 30 tablet 6   Continuous Blood Gluc Transmit (DEXCOM G6 TRANSMITTER) MISC 1 kit by Does not apply route See admin instructions. Use to check blood sugar 1 each 1   diclofenac Sodium (VOLTAREN) 1 % GEL Apply 2 g topically 3 (three) times daily. 100 g 3   glucagon 1 MG injection Inject 1 mg into the muscle once as needed for up to 1 dose. 1  each 12   glucose blood test strip Use as instructed to check glucose 4 times daily. 400 each 3   Insulin Aspart FlexPen (NOVOLOG) 100 UNIT/ML USE UP TO 50 UNITS DAILY in insulin pump 50 mL 0   insulin degludec (TRESIBA FLEXTOUCH) 100 UNIT/ML FlexTouch Pen Inject 46 Units into the skin daily. 36 mL 0   Insulin Pen Needle (PEN NEEDLES) 32G X 5 MM MISC Use 1x a day 100 each 3   losartan (COZAAR) 25 MG tablet Take 1 tablet (25 mg total) by mouth daily. 90 tablet 3   nitroGLYCERIN (NITROSTAT) 0.4 MG SL tablet Place 1 tablet (0.4 mg total) under the tongue every 5 (five) minutes as needed for chest pain. 25 tablet 1   rizatriptan (MAXALT-MLT) 10 MG disintegrating tablet SMARTSIG:1 Tablet(s) By Mouth 1-2 Times Daily (Patient not taking: Reported on 09/14/2021)     rosuvastatin (CRESTOR) 20 MG tablet Take 1 tablet (20 mg total) by mouth daily. 90 tablet 3   SYNTHROID 75 MCG tablet TAKE ONE TABLET BY MOUTH monday-saturday before breakfast 80 tablet 1   topiramate (TOPAMAX) 50 MG tablet Take 1 tablet (50 mg total) by mouth 2 (two) times daily. (Patient not taking: Reported on 09/14/2021) 60 tablet 12   Ubrogepant (UBRELVY) 50 MG TABS Take 50 mg by mouth as needed. May repeat x 1 tab after 2 hours; max 2 tabs per day or 8 per month 8 tablet 6   venlafaxine (EFFEXOR) 37.5 MG tablet Take 37.5 mg once a day for a week then increase to 37.5 mg twice a day and continue that dose     No facility-administered medications prior to visit.     Per HPI unless specifically indicated in ROS section below Review of Systems  Constitutional:  Negative for activity change, appetite change, chills, fatigue, fever and unexpected weight change.  HENT:  Negative for hearing loss.   Eyes:  Negative for visual disturbance.  Respiratory:  Negative for cough, chest tightness, shortness of breath and wheezing.   Cardiovascular:  Negative for chest pain, palpitations and leg swelling.  Gastrointestinal:  Negative for abdominal  distention, abdominal pain, blood in stool, constipation, diarrhea, nausea and vomiting.  Genitourinary:  Negative for difficulty urinating and hematuria.  Musculoskeletal:  Negative for arthralgias, myalgias and neck pain.  Skin:  Negative for rash.  Neurological:  Negative for dizziness, seizures, syncope and headaches.  Hematological:  Negative for adenopathy. Does not bruise/bleed easily.  Psychiatric/Behavioral:  Negative for dysphoric mood. The patient is not nervous/anxious.    Objective:  There were no vitals taken for this visit.  Wt Readings from Last 3 Encounters:  09/14/21 150 lb (68 kg)  09/08/21 144 lb (65.3 kg)  08/02/21 157 lb 12.8 oz (71.6 kg)      Physical Exam Vitals and nursing note  reviewed.  Constitutional:      Appearance: Normal appearance. She is not ill-appearing.  HENT:     Head: Normocephalic and atraumatic.     Right Ear: Tympanic membrane, ear canal and external ear normal. There is no impacted cerumen.     Left Ear: Tympanic membrane, ear canal and external ear normal. There is no impacted cerumen.  Eyes:     General:        Right eye: No discharge.        Left eye: No discharge.     Extraocular Movements: Extraocular movements intact.     Conjunctiva/sclera: Conjunctivae normal.     Pupils: Pupils are equal, round, and reactive to light.  Neck:     Thyroid: No thyroid mass or thyromegaly.  Cardiovascular:     Rate and Rhythm: Normal rate and regular rhythm.     Pulses: Normal pulses.     Heart sounds: Normal heart sounds. No murmur heard. Pulmonary:     Effort: Pulmonary effort is normal. No respiratory distress.     Breath sounds: Normal breath sounds. No wheezing, rhonchi or rales.  Abdominal:     General: Bowel sounds are normal. There is no distension.     Palpations: Abdomen is soft. There is no mass.     Tenderness: There is no abdominal tenderness. There is no guarding or rebound.     Hernia: No hernia is present.  Musculoskeletal:      Cervical back: Normal range of motion and neck supple. No rigidity.     Right lower leg: No edema.     Left lower leg: No edema.  Lymphadenopathy:     Cervical: No cervical adenopathy.  Skin:    General: Skin is warm and dry.     Findings: No rash.  Neurological:     General: No focal deficit present.     Mental Status: She is alert. Mental status is at baseline.  Psychiatric:        Mood and Affect: Mood normal.        Behavior: Behavior normal.      Results for orders placed or performed in visit on 09/14/21  POC COVID-19  Result Value Ref Range   SARS Coronavirus 2 Ag Positive (A) Negative  POC Influenza A&B (Binax test)  Result Value Ref Range   Influenza A, POC Negative Negative   Influenza B, POC Negative Negative    Assessment & Plan:  This visit occurred during the SARS-CoV-2 public health emergency.  Safety protocols were in place, including screening questions prior to the visit, additional usage of staff PPE, and extensive cleaning of exam room while observing appropriate contact time as indicated for disinfecting solutions.   Problem List Items Addressed This Visit   None    No orders of the defined types were placed in this encounter.  No orders of the defined types were placed in this encounter.    ***Follow up plan: No follow-ups on file.  [name], MD

## 2021-09-21 NOTE — Progress Notes (Signed)
Subjective:   Cassandra Benson is a 80 y.o. female who presents for Medicare Annual (Subsequent) preventive examination.  I connected with Cassandra Benson today by telephone and verified that I am speaking with the correct person using two identifiers. Location patient: home Location provider: work Persons participating in the virtual visit: patient, Marine scientist.    I discussed the limitations, risks, security and privacy concerns of performing an evaluation and management service by telephone and the availability of in person appointments. I also discussed with the patient that there may be a patient responsible charge related to this service. The patient expressed understanding and verbally consented to this telephonic visit.    Interactive audio and video telecommunications were attempted between this provider and patient, however failed, due to patient having technical difficulties OR patient did not have access to video capability.  We continued and completed visit with audio only.  Some vital signs may be absent or patient reported.   Time Spent with patient on telephone encounter: 30 minutes  Review of Systems     Cardiac Risk Factors include: advanced age (>66men, >50 women);diabetes mellitus;dyslipidemia;hypertension     Objective:    Today's Vitals   09/23/21 1031  Weight: 144 lb (65.3 kg)  Height: $Remove'5\' 3"'MXTggSR$  (1.6 m)  PainSc: 0-No pain   Body mass index is 25.51 kg/m.  Advanced Directives 09/23/2021 09/10/2021 05/22/2021 12/04/2020 11/19/2020 09/09/2020 07/16/2020  Does Patient Have a Medical Advance Directive? Yes Yes No Yes Yes Yes No  Type of Paramedic of [place];Living will [place];Living will - - Living will;Healthcare Power of [place];Living will -  Does patient want to make changes to medical advance directive? Yes (MAU/Ambulatory/Procedural Areas - Information given) - - - No - Patient  declined - -  Copy of [place] in Chart? Yes - validated most recent copy scanned in chart (See row information) - - - - Yes - validated most recent copy scanned in chart (See row information) -  Would patient like information on creating a medical advance directive? - - - - No - Patient declined - -    Current Medications (verified) Outpatient Encounter Medications as of 09/23/2021  Medication Sig   acetaminophen (TYLENOL) 500 MG tablet Take 1 tablet (500 mg total) by mouth 3 (three) times daily as needed.   Ascorbic Acid (VITAMIN C) 100 MG tablet Take 100 mg by mouth daily.   BD ULTRA-FINE LANCETS lancets Use as instructed upto 6 times daily   benzonatate (TESSALON) 100 MG capsule Take 1 capsule (100 mg total) by mouth 3 (three) times daily as needed for cough.   carvedilol (COREG) 6.25 MG tablet Take 1 tablet (6.25 mg total) by mouth 2 (two) times daily with a meal.   Cholecalciferol (VITAMIN D3) 25 MCG (1000 UT) CAPS Take 1 capsule (1,000 Units total) by mouth daily.   clopidogrel (PLAVIX) 75 MG tablet TAKE ONE TABLET BY MOUTH EVERY EVENING   Continuous Blood Gluc Transmit (DEXCOM G6 TRANSMITTER) MISC 1 kit by Does not apply route See admin instructions. Use to check blood sugar   diclofenac Sodium (VOLTAREN) 1 % GEL Apply 2 g topically 3 (three) times daily.   glucagon 1 MG injection Inject 1 mg into the muscle once as needed for up to 1 dose.   glucose blood test strip Use as instructed to check glucose 4 times daily.   Insulin Aspart FlexPen (NOVOLOG) 100 UNIT/ML USE UP TO  50 UNITS DAILY in insulin pump   insulin degludec (TRESIBA FLEXTOUCH) 100 UNIT/ML FlexTouch Pen Inject 46 Units into the skin daily.   Insulin Pen Needle (PEN NEEDLES) 32G X 5 MM MISC Use 1x a day   losartan (COZAAR) 25 MG tablet TAKE ONE TABLET BY MOUTH ONCE DAILY   nitroGLYCERIN (NITROSTAT) 0.4 MG SL tablet Place 1 tablet (0.4 mg total) under the tongue every 5 (five) minutes as needed for chest  pain.   rosuvastatin (CRESTOR) 20 MG tablet TAKE ONE TABLET BY MOUTH EVERY EVENING   SYNTHROID 75 MCG tablet TAKE ONE TABLET BY MOUTH monday-saturday before breakfast   topiramate (TOPAMAX) 50 MG tablet Take 1 tablet (50 mg total) by mouth 2 (two) times daily.   Ubrogepant (UBRELVY) 50 MG TABS Take 50 mg by mouth as needed. May repeat x 1 tab after 2 hours; max 2 tabs per day or 8 per month   venlafaxine (EFFEXOR) 37.5 MG tablet Take 37.5 mg once a day for a week then increase to 37.5 mg twice a day and continue that dose   rizatriptan (MAXALT-MLT) 10 MG disintegrating tablet SMARTSIG:1 Tablet(s) By Mouth 1-2 Times Daily (Patient not taking: Reported on 09/14/2021)   [DISCONTINUED] losartan (COZAAR) 25 MG tablet Take 1 tablet (25 mg total) by mouth daily.   [DISCONTINUED] rosuvastatin (CRESTOR) 20 MG tablet Take 1 tablet (20 mg total) by mouth daily.   No facility-administered encounter medications on file as of 09/23/2021.    Allergies (verified) Iodinated contrast media, Penicillins, Amlodipine, Anectine [succinylcholine], Codeine, Gabapentin, Influenza vaccines, Nortriptyline, Pamelor [nortriptyline hcl], Valsartan, Zetia [ezetimibe], Erythromycin, and Sulfa antibiotics   History: Past Medical History:  Diagnosis Date   Acute diverticulitis 04/2021   [facility] ER, CT confirmed   Allergy    ANA positive    positive ANA pattern 1 speckled   Arthritis    Carotid stenosis, asymptomatic 06/19/2015   0-32% RICA 12-24% LICA rpt 1 yr (04/2499)    Colon polyps    Dermatomyositis ([place])    Diabetes mellitus without complication ([place])    Type 1   Diverticulosis    sigmoid on CT scan 12/2019   Family history of adverse reaction to anesthesia    brothr went into cardiac arrest from anectine   Fibromyalgia    prior PCP   GERD (gastroesophageal reflux disease)    prior PCP   Glaucoma    Narrow angle   History of blood clots    DVT, in 20s, none since   History of chicken pox    History of  diverticulitis    History of pericarditis 1986   with hospitalization   History of pneumonia 2014   History of shingles    History of UTI    Hyperlipidemia    Hypertension    Hypothyroidism    Mixed connective tissue disease ([place])    Partial small bowel obstruction ([place]) 12/2019   managed conservatively   Peptic ulcer    Pneumonia    PONV (postoperative nausea and vomiting)    Raynaud's disease without gangrene    Shoulder pain left   h/o RTC tendonitis and adhesive capsulitis   Sjogren's syndrome (HCC)    Sleep apnea    prior PCP - no CPAP for about 10 yrs   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[place])    Vitamin D deficiency    prior PCP   Past Surgical History:  Procedure Laterality Date   ABDOMINAL HYSTERECTOMY  1978   fibroids and menorrhagia, ovaries remain  ARTERY BIOPSY Right 04/06/2018   Procedure: BIOPSY TEMPORAL ARTERY RIGHT;  Surgeon: Beverly Gust, MD;  Location: [place];  Service: ENT;  Laterality: Right;  Diabetic - insulin pump sleep apnea   [facility] normal per patient   COLONOSCOPY  10/2011   1 TA, 1 HP, very tortuous colon ([place])   COLONOSCOPY  02/2020   TA, inflammatory polyp, mod diverticulosis, int/ext hemorrhoids ([place]) no rpt recommended    COLONOSCOPY WITH ESOPHAGOGASTRODUODENOSCOPY (EGD)  03/2007   2 ulcers, benign polyp, rpt 5 yrs [facility])   JOINT REPLACEMENT Right    hip   PARTIAL HIP ARTHROPLASTY  2013   Right hip replacement   TONSILLECTOMY     TONSILLECTOMY AND ADENOIDECTOMY     TRANSCAROTID ARTERY REVASCULARIZATION  Left 07/23/2018   Procedure: TRANSCAROTID ARTERY REVASCULARIZATION;  Surgeon: Marty Heck, MD;  Location: [facility] OR;  Service: Vascular;  Laterality: Left;   TUBAL LIGATION     VAGINAL DELIVERY     x2, no complications   Family History  Problem Relation Age of Onset   CAD Mother 92       MI, aortic valve issues   COPD Mother    Lupus Mother    [name]  Mother    Rheum arthritis Mother    CAD Father 62       CABG x2, aortic valve replacement   Stroke Sister    CAD Sister    45 Sister        brain   Lupus Sister    Diabetes Sister    Alcohol abuse Brother    CAD Brother 70       MI   Stroke Brother    Seizures Son    COPD Brother        agent orange   CAD Brother 85       stent   Diabetes Brother    Depression Grandchild    Breast cancer Maternal Aunt    Diabetes Sister    Breast cancer Sister    Breast cancer Maternal Aunt    Stroke Maternal Grandmother    Hypertension Maternal Grandmother    Gallbladder disease Maternal Grandmother    Colon cancer Neg Hx    Esophageal cancer Neg Hx    Rectal cancer Neg Hx    Stomach cancer Neg Hx    Social History   Socioeconomic History   Marital status: Widowed    Spouse name: Not on file   Number of children: 2   Years of education: Not on file   Highest education level: Not on file  Occupational History   Occupation: retired  Tobacco Use   Smoking status: Never   Smokeless tobacco: Never  Vaping Use   Vaping Use: Never used  Substance and Sexual Activity   Alcohol use: No   Drug use: No   Sexual activity: Not on file  Other Topics Concern   Not on file  Social History Narrative   Lives in [place], moved from [place].    Widow - husband decreased 01/2016 of metastatic colon CA   No pets.   Son Khaliya Golinski lives nearby. Daughter lives in [place]. Sister lives 2 blocks away.    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, [employer]   Exercise - limited   Diet - good water, fruits/vegetables daily,  limited meat, protein drink every morning   Social Determinants of Health   Financial Resource Strain: Low Risk    Difficulty of Paying Living Expenses: Not hard at all  Food Insecurity: No Food Insecurity   Worried About Charity fundraiser in the Last Year: Never true   Ran Out of  Food in the Last Year: Never true  Transportation Needs: No Transportation Needs   Lack of Transportation (Medical): No   Lack of Transportation (Non-Medical): No  Physical Activity: Insufficiently Active   Days of Exercise per Week: 3 days   Minutes of Exercise per Session: 30 min  Stress: No Stress Concern Present   Feeling of Stress : Not at all  Social Connections: Moderately Integrated   Frequency of Communication with Friends and Family: More than three times a week   Frequency of Social Gatherings with Friends and Family: More than three times a week   Attends Religious Services: More than 4 times per year   Active Member of [employer] or Organizations: Yes   Attends Archivist Meetings: More than 4 times per year   Marital Status: Widowed    Tobacco Counseling Counseling given: Not Answered   Clinical Intake:  Pre-visit preparation completed: Yes  Pain : No/denies pain Pain Score: 0-No pain     BMI - recorded: 25.51 Nutritional Status: BMI 25 -29 Overweight Nutritional Risks: None Diabetes: Yes CBG done?: No Did pt. bring in CBG monitor from home?: No  How often do you need to have someone help you when you read instructions, pamphlets, or other written materials from your doctor or pharmacy?: 1 - Never  Diabetes:  Is the patient diabetic?  Yes  If diabetic, was a CBG obtained today?  No  Did the patient bring in their glucometer from home?  No  How often do you monitor your CBG's? dailt.   Financial Strains and Diabetes Management:  Are you having any financial strains with the device, your supplies or your medication? No .  Does the patient want to be seen by Chronic Care Management for management of their diabetes?  No  Would the patient like to be referred to a Nutritionist or for Diabetic Management?  No   Diabetic Exams:  Diabetic Eye Exam: Completed 11/04/20.   Diabetic Foot Exam: Completed 03/16/21.   Interpreter Needed?: No  Information  entered by :: Orrin Brigham LPN   Activities of Daily Living In your present state of health, do you have any difficulty performing the following activities: 09/23/2021  Hearing? N  Vision? Y  Difficulty concentrating or making decisions? N  Walking or climbing stairs? Y  Dressing or bathing? N  Doing errands, shopping? N  Preparing Food and eating ? N  Using the Toilet? N  In the past six months, have you accidently leaked urine? N  Do you have problems with loss of bowel control? N  Managing your Medications? N  Managing your Finances? N  Housekeeping or managing your Housekeeping? N  Some recent data might be hidden    Patient Care Team: [name], MD as PCP - General (Family Medicine) Rockey Situ Kathlene November, MD as PCP - Cardiology (Cardiology) Birder Robson, MD as Referring Physician (Ophthalmology) Debbora Dus, [facility] as Pharmacist (Pharmacist)  Indicate any recent Medical Services you may have received from other than [facility] providers in the past year (date may be approximate).     Assessment:   This is a routine wellness examination for Cassandra Benson.  Hearing/Vision  screen Hearing Screening - Comments:: No issues Vision Screening - Comments:: Last exam 11/04/20, Golden eye , wears glasses  Dietary issues and exercise activities discussed: Current Exercise Habits: Home exercise routine, Type of exercise: walking, Time (Minutes): 30, Frequency (Times/Week): 3, Weekly Exercise (Minutes/Week): 90, Intensity: Mild   Goals Addressed             This Visit's Progress    Patient Stated       Would like to maintain current routine.       Depression Screen PHQ 2/9 Scores 09/23/2021 10/07/2020 09/09/2020 01/13/2020 09/13/2019 08/29/2018 01/29/2018  PHQ - 2 Score 0 0 0 0 0 0 0  PHQ- 9 Score - 0 0 - - 0 -    Fall Risk Fall Risk  09/23/2021 10/07/2020 09/09/2020 01/13/2020 09/13/2019  Falls in the past year? 1 0 _0 Comment - - tripped and fell - -  Number falls in  past yr: 0 0 0 0 0  Comment - - - fell in yard, bruised hip -  Injury with Fall? 1 0 _1 Comment brusing - - - -  Risk for fall due to : Other (Comment) - Medication side effect - -  Risk for fall due to: Comment took a wrong step due to blood sugar - - - -  Follow up Falls prevention discussed Falls evaluation completed Falls evaluation completed;Falls prevention discussed - -    FALL RISK PREVENTION PERTAINING TO THE HOME:  Any stairs in or around the home? No  If so, are there any without handrails? No  Home free of loose throw rugs in walkways, pet beds, electrical cords, etc? Yes  Adequate lighting in your home to reduce risk of falls? Yes   ASSISTIVE DEVICES UTILIZED TO PREVENT FALLS:  Life alert? No  Use of a cane, walker or w/c? Yes , cane  Grab bars in the bathroom? Yes  Shower chair or bench in shower? Yes  Elevated toilet seat or a handicapped toilet? Yes   TIMED UP AND GO:  Was the test performed? No .    Cognitive Function: Normal cognitive status assessed by  this Nurse Health Advisor. No abnormalities found.   MMSE - Mini Mental State Exam 09/09/2020 08/29/2018 08/09/2016  Orientation to time _2 Orientation to Place _3 Registration _4 Attention/ Calculation 5 0 0  Recall _5 Language- name 2 objects - 0 0  Language- repeat _6 Language- follow 3 step command - 3 3  Language- read & follow direction - 0 0  Write a sentence - 0 0  Copy design - 0 0  Total score - 20 20        Immunizations Immunization History  Administered Date(s) Administered   Pneumococcal Conjugate-13 09/23/2014   Pneumococcal Polysaccharide-23 01/05/2010, 07/17/2013   Tdap 10/07/2011    TDAP status: Up to date  Flu Vaccine status: Declined, Education has been provided regarding the importance of this vaccine but patient still declined. Advised may receive this vaccine at local pharmacy or Health Dept. Aware to provide a copy of the vaccination record if  obtained from local pharmacy or Health Dept. Verbalized acceptance and understanding.  Pneumococcal vaccine status: Up to date  Covid-19 vaccine status: Information provided on how to obtain vaccines.   Qualifies for Shingles Vaccine? Yes   Zostavax completed No   Shingrix Completed?: No.    Education  has been provided regarding the importance of this vaccine. Patient has been advised to call insurance company to determine out of pocket expense if they have not yet received this vaccine. Advised may also receive vaccine at local pharmacy or Health Dept. Verbalized acceptance and understanding.  Screening Tests Health Maintenance  Topic Date Due   Zoster Vaccines- Shingrix (1 of 2) Never done   COVID-19 Vaccine (1) 09/25/2024 (Originally 12/27/1941)   TETANUS/TDAP  10/06/2021   OPHTHALMOLOGY EXAM  11/04/2021   HEMOGLOBIN A1C  01/30/2022   MAMMOGRAM  03/05/2022   FOOT EXAM  03/16/2022   Pneumonia Vaccine 24+ Years old  Completed   DEXA SCAN  Completed   Hepatitis C Screening  Completed   HPV VACCINES  Aged Out    Health Maintenance  Health Maintenance Due  Topic Date Due   Zoster Vaccines- Shingrix (1 of 2) Never done    Colorectal cancer screening: No longer required.   Mammogram status: Completed 03/05/21. Repeat every year  Bone Density status: Ordered 09/23/21. Pt provided with contact info and advised to call to schedule appt.  Lung Cancer Screening: (Low Dose CT Chest recommended if Age 36-80 years, 30 pack-year currently smoking OR have quit w/in 15years.) does not qualify.     Additional Screening:  Hepatitis C Screening: does qualify; Completed 11/11/19  Vision Screening: Recommended annual ophthalmology exams for early detection of glaucoma and other disorders of the eye. Is the patient up to date with their annual eye exam?  Yes  Who is the provider or what is the name of the office in which the patient attends annual eye exams? [facility]   Dental  Screening: Recommended annual dental exams for proper oral hygiene  Community Resource Referral / Chronic Care Management: CRR required this visit?  No   CCM required this visit?  No      Plan:     I have personally reviewed and noted the following in the patients chart:   Medical and social history Use of alcohol, tobacco or illicit drugs  Current medications and supplements including opioid prescriptions.  Functional ability and status Nutritional status Physical activity Advanced directives List of other physicians Hospitalizations, surgeries, and ER visits in previous 12 months Vitals Screenings to include cognitive, depression, and falls Referrals and appointments  In addition, I have reviewed and discussed with patient certain preventive protocols, quality metrics, and best practice recommendations. A written personalized care plan for preventive services as well as general preventive health recommendations were provided to patient.   Due to this being a telephonic visit, the after visit summary with patients personalized plan was offered to patient via mail or my-chart. Patient would like to access on my-chart.     Loma Messing, LPN  [id number]   Nurse Health Advisor  Nurse Notes: none

## 2021-09-22 ENCOUNTER — Other Ambulatory Visit: Payer: Self-pay | Admitting: Family Medicine

## 2021-09-22 ENCOUNTER — Telehealth: Payer: Self-pay | Admitting: Family Medicine

## 2021-09-22 ENCOUNTER — Ambulatory Visit: Payer: HMO | Admitting: Family Medicine

## 2021-09-22 NOTE — Telephone Encounter (Signed)
Patient missed physical appt this morning (had been rescheduled from last week due to COVID illness). Plz call to f/u ensure feeling better from COVID standpoint and she will need to reschedule physical/wellness visit.

## 2021-09-22 NOTE — Telephone Encounter (Signed)
E-scribed refills.  Plz r/s cpe visit.

## 2021-09-23 ENCOUNTER — Ambulatory Visit (INDEPENDENT_AMBULATORY_CARE_PROVIDER_SITE_OTHER): Payer: HMO

## 2021-09-23 VITALS — Ht 63.0 in | Wt 144.0 lb

## 2021-09-23 DIAGNOSIS — Z780 Asymptomatic menopausal state: Secondary | ICD-10-CM

## 2021-09-23 DIAGNOSIS — Z0000 Encounter for general adult medical examination without abnormal findings: Secondary | ICD-10-CM | POA: Diagnosis not present

## 2021-09-23 NOTE — Telephone Encounter (Signed)
Spoke with pt asking for update on sxs.  States in general she's feeling better. But still battling head congestion, prod cough with now clear mucous (was green) and some brain fog.  Pt states she had wellness visit today.  Scheduled cpe on 09/28/20 at 11:00.

## 2021-09-23 NOTE — Patient Instructions (Addendum)
Cassandra Benson , Thank you for taking time to complete your Medicare Wellness Visit. I appreciate your ongoing commitment to your health goals. Please review the following plan we discussed and let me know if I can assist you in the future.   Screening recommendations/referrals: Colonoscopy: no longer required  Mammogram: up to date completed 610/22, due 03/05/22 Bone Density: due, last completed 04/26/12, ordered today, someone will call to schedule an appointment Recommended yearly ophthalmology/optometry visit for glaucoma screening and checkup Recommended yearly dental visit for hygiene and checkup  Vaccinations: Influenza vaccine: May obtain vaccine at our office or your local pharmacy. Pneumococcal vaccine: up to date Tdap vaccine: up to date, completed 10/07/11, due 10/06/21 Shingles vaccine: May obtain vaccine at our your local pharmacy. Covid-19:newest booster available at your local pharmacy  Advanced directives: copy on file  Conditions/risks identified: see problem list  Next appointment: Follow up in one year for your annual wellness visit    Preventive Care 65 Years and Older, Female Preventive care refers to lifestyle choices and visits with your health care provider that can promote health and wellness. What does preventive care include? A yearly physical exam. This is also called an annual well check. Dental exams once or twice a year. Routine eye exams. Ask your health care provider how often you should have your eyes checked. Personal lifestyle choices, including: Daily care of your teeth and gums. Regular physical activity. Eating a healthy diet. Avoiding tobacco and drug use. Limiting alcohol use. Practicing safe sex. Taking low-dose aspirin every day. Taking vitamin and mineral supplements as recommended by your health care provider. What happens during an annual well check? The services and screenings done by your health care provider during your annual well  check will depend on your age, overall health, lifestyle risk factors, and family history of disease. Counseling  Your health care provider may ask you questions about your: Alcohol use. Tobacco use. Drug use. Emotional well-being. Home and relationship well-being. Sexual activity. Eating habits. History of falls. Memory and ability to understand (cognition). Work and work Statistician. Reproductive health. Screening  You may have the following tests or measurements: Height, weight, and BMI. Blood pressure. Lipid and cholesterol levels. These may be checked every 5 years, or more frequently if you are over 25 years old. Skin check. Lung cancer screening. You may have this screening every year starting at age 43 if you have a 30-pack-year history of smoking and currently smoke or have quit within the past 15 years. Fecal occult blood test (FOBT) of the stool. You may have this test every year starting at age 14. Flexible sigmoidoscopy or colonoscopy. You may have a sigmoidoscopy every 5 years or a colonoscopy every 10 years starting at age 8. Hepatitis C blood test. Hepatitis B blood test. Sexually transmitted disease (STD) testing. Diabetes screening. This is done by checking your blood sugar (glucose) after you have not eaten for a while (fasting). You may have this done every 1-3 years. Bone density scan. This is done to screen for osteoporosis. You may have this done starting at age 84. Mammogram. This may be done every 1-2 years. Talk to your health care provider about how often you should have regular mammograms. Talk with your health care provider about your test results, treatment options, and if necessary, the need for more tests. Vaccines  Your health care provider may recommend certain vaccines, such as: Influenza vaccine. This is recommended every year. Tetanus, diphtheria, and acellular pertussis (Tdap, Td) vaccine. You  may need a Td booster every 10 years. Zoster  vaccine. You may need this after age 87. Pneumococcal 13-valent conjugate (PCV13) vaccine. One dose is recommended after age 16. Pneumococcal polysaccharide (PPSV23) vaccine. One dose is recommended after age 64. Talk to your health care provider about which screenings and vaccines you need and how often you need them. This information is not intended to replace advice given to you by your health care provider. Make sure you discuss any questions you have with your health care provider. Document Released: 10/09/2015 Document Revised: 06/01/2016 Document Reviewed: 07/14/2015 Elsevier Interactive Patient Education  2017 [place] Prevention in the Home Falls can cause injuries. They can happen to people of all ages. There are many things you can do to make your home safe and to help prevent falls. What can I do on the outside of my home? Regularly fix the edges of walkways and driveways and fix any cracks. Remove anything that might make you trip as you walk through a door, such as a raised step or threshold. Trim any bushes or trees on the path to your home. Use bright outdoor lighting. Clear any walking paths of anything that might make someone trip, such as rocks or tools. Regularly check to see if handrails are loose or broken. Make sure that both sides of any steps have handrails. Any raised decks and porches should have guardrails on the edges. Have any leaves, snow, or ice cleared regularly. Use sand or salt on walking paths during winter. Clean up any spills in your garage right away. This includes oil or grease spills. What can I do in the bathroom? Use night lights. Install grab bars by the toilet and in the tub and shower. Do not use towel bars as grab bars. Use non-skid mats or decals in the tub or shower. If you need to sit down in the shower, use a plastic, non-slip stool. Keep the floor dry. Clean up any water that spills on the floor as soon as it happens. Remove  soap buildup in the tub or shower regularly. Attach bath mats securely with double-sided non-slip rug tape. Do not have throw rugs and other things on the floor that can make you trip. What can I do in the bedroom? Use night lights. Make sure that you have a light by your bed that is easy to reach. Do not use any sheets or blankets that are too big for your bed. They should not hang down onto the floor. Have a firm chair that has side arms. You can use this for support while you get dressed. Do not have throw rugs and other things on the floor that can make you trip. What can I do in the kitchen? Clean up any spills right away. Avoid walking on wet floors. Keep items that you use a lot in easy-to-reach places. If you need to reach something above you, use a strong step stool that has a grab bar. Keep electrical cords out of the way. Do not use floor polish or wax that makes floors slippery. If you must use wax, use non-skid floor wax. Do not have throw rugs and other things on the floor that can make you trip. What can I do with my stairs? Do not leave any items on the stairs. Make sure that there are handrails on both sides of the stairs and use them. Fix handrails that are broken or loose. Make sure that handrails are as long as the  stairways. Check any carpeting to make sure that it is firmly attached to the stairs. Fix any carpet that is loose or worn. Avoid having throw rugs at the top or bottom of the stairs. If you do have throw rugs, attach them to the floor with carpet tape. Make sure that you have a light switch at the top of the stairs and the bottom of the stairs. If you do not have them, ask someone to add them for you. What else can I do to help prevent falls? Wear shoes that: Do not have high heels. Have rubber bottoms. Are comfortable and fit you well. Are closed at the toe. Do not wear sandals. If you use a stepladder: Make sure that it is fully opened. Do not climb a  closed stepladder. Make sure that both sides of the stepladder are locked into place. Ask someone to hold it for you, if possible. Clearly mark and make sure that you can see: Any grab bars or handrails. First and last steps. Where the edge of each step is. Use tools that help you move around (mobility aids) if they are needed. These include: Canes. Walkers. Scooters. Crutches. Turn on the lights when you go into a dark area. Replace any light bulbs as soon as they burn out. Set up your furniture so you have a clear path. Avoid moving your furniture around. If any of your floors are uneven, fix them. If there are any pets around you, be aware of where they are. Review your medicines with your doctor. Some medicines can make you feel dizzy. This can increase your chance of falling. Ask your doctor what other things that you can do to help prevent falls. This information is not intended to replace advice given to you by your health care provider. Make sure you discuss any questions you have with your health care provider. Document Released: 07/09/2009 Document Revised: 02/18/2016 Document Reviewed: 10/17/2014 Elsevier Interactive Patient Education  2017 [employer].

## 2021-09-23 NOTE — Telephone Encounter (Signed)
Disregard previous message.  Pt scheduled on 09/28/21 at 11:00.

## 2021-09-28 ENCOUNTER — Other Ambulatory Visit: Payer: Self-pay

## 2021-09-28 ENCOUNTER — Telehealth: Payer: HMO

## 2021-09-28 ENCOUNTER — Ambulatory Visit (INDEPENDENT_AMBULATORY_CARE_PROVIDER_SITE_OTHER): Payer: HMO | Admitting: Family Medicine

## 2021-09-28 VITALS — BP 142/70 | HR 93 | Temp 98.0°F | Ht 63.75 in | Wt 155.2 lb

## 2021-09-28 DIAGNOSIS — I7090 Unspecified atherosclerosis: Secondary | ICD-10-CM

## 2021-09-28 DIAGNOSIS — Z0000 Encounter for general adult medical examination without abnormal findings: Secondary | ICD-10-CM

## 2021-09-28 DIAGNOSIS — M797 Fibromyalgia: Secondary | ICD-10-CM | POA: Diagnosis not present

## 2021-09-28 DIAGNOSIS — I700 Atherosclerosis of aorta: Secondary | ICD-10-CM

## 2021-09-28 DIAGNOSIS — I6522 Occlusion and stenosis of left carotid artery: Secondary | ICD-10-CM | POA: Diagnosis not present

## 2021-09-28 DIAGNOSIS — E038 Other specified hypothyroidism: Secondary | ICD-10-CM | POA: Diagnosis not present

## 2021-09-28 DIAGNOSIS — I739 Peripheral vascular disease, unspecified: Secondary | ICD-10-CM

## 2021-09-28 DIAGNOSIS — I10 Essential (primary) hypertension: Secondary | ICD-10-CM | POA: Diagnosis not present

## 2021-09-28 DIAGNOSIS — E1169 Type 2 diabetes mellitus with other specified complication: Secondary | ICD-10-CM | POA: Diagnosis not present

## 2021-09-28 DIAGNOSIS — E139 Other specified diabetes mellitus without complications: Secondary | ICD-10-CM

## 2021-09-28 DIAGNOSIS — E559 Vitamin D deficiency, unspecified: Secondary | ICD-10-CM | POA: Diagnosis not present

## 2021-09-28 DIAGNOSIS — R519 Headache, unspecified: Secondary | ICD-10-CM

## 2021-09-28 DIAGNOSIS — I7300 Raynaud's syndrome without gangrene: Secondary | ICD-10-CM

## 2021-09-28 DIAGNOSIS — M3500 Sicca syndrome, unspecified: Secondary | ICD-10-CM

## 2021-09-28 DIAGNOSIS — E785 Hyperlipidemia, unspecified: Secondary | ICD-10-CM

## 2021-09-28 DIAGNOSIS — E063 Autoimmune thyroiditis: Secondary | ICD-10-CM

## 2021-09-28 NOTE — Progress Notes (Signed)
Patient ID: Cassandra Benson, female    DOB: Jan 27, 1941, 81 y.o.   MRN: [id number]  This visit was conducted in person.  BP (!) 142/70    Pulse 93    Temp 98 F (36.7 C) (Temporal)    Ht 5' 3.75" (1.619 m)    Wt 155 lb 4 oz (70.4 kg)    SpO2 98%    BMI 26.86 kg/m    CC: CPE Subjective:   HPI: Cassandra Benson is a 81 y.o. female presenting on 09/28/2021 for Annual Exam (Part 2 )   Saw health advisor last week for medicare wellness visit. Note reviewed.    Hearing Screening   _0  _1  _2  _3  _4   Right ear 20 0 _5 Left ear _6 Vision Screening - Comments:: Last eye exam in fall of 12/07/20 at [facility] from 09/23/2021 in [facility] [place]  PHQ-2 Total Score 0       Fall Risk  09/23/2021 10/07/2020 09/09/2020 01/13/2020 09/13/2019  Falls in the past year? 1 0 _7 Comment - - tripped and fell - -  Number falls in past yr: 0 0 0 0 0  Comment - - - fell in yard, bruised hip -  Injury with Fall? 1 0 _8 Comment brusing - - - -  Risk for fall due to : Other (Comment) - Medication side effect - -  Risk for fall due to: Comment took a wrong step due to blood sugar - - - -  Follow up Falls prevention discussed Falls evaluation completed Falls evaluation completed;Falls prevention discussed - -  Above fall was >1 yr ago.  60 yo aunt passed away recently. Feeling well today - didn't feel well yesterday.  Not fasting today.   COVID infection last month - outside of window for antiviral, did have good improvement.   Saw neurology for ongoing intractable R temporal headache - ?migraine variant, started on topamax 45m BID and ubrelvy 546mabortively (avoid triptans in vascular disease). She doesn't think these are migraines.   Preventative: COLONOSCOPY 0203/14/13 1 TA, 1 HP[facility], inflammatory polyp, mod diverticulosis,  int/ext hemorrhoids ([facility]) no rpt recommended  Breast cancer screening - [facility] [facility] [place].  Well woman exam - last 20Mar 14, 2013/p abd hysterectomy, ovaries remain. Denies pelvic symptoms  DEXA scan - normal per patient 04/2012  Flu shot - ALLERGIC COVID vaccine - discussed, declines - hesitant due to poor reactions to previous vaccines.  Tdap 07/2012.  Pneumovax 20Mar 14, [id number]-500Mar 14, 2015 Shingrix - declines. Has had shingles.  Advanced directive: forms were scanned (08/2017) - HCPOA are son Cassandra Benson Cassandra SkillernDoes not want prolonged life support if terminal condition. HCPOA has authority to override directives.  Seat belt use discussed  Sunscreen use discussed, no changing moles on skin  Non smoker  Alcohol - none  Dentist overdue - rec more frequent due to sjogren's  [facility] [place] Bowel - no diarrhea/constipation  Bladder - some stress incontinence symptoms   [place] 20[place], 2017[place] pets.  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  Diet - good water, fruits/vegetables daily, limited meat, protein drink every morning      Relevant past medical, surgical, family and social history reviewed and updated as indicated. Interim medical history since our last visit reviewed. Allergies and medications reviewed and updated. Outpatient Medications Prior to Visit  Medication Sig Dispense Refill   acetaminophen (TYLENOL) 500 MG tablet Take 1 tablet (500 mg total) by mouth 3 (three) times daily as needed.     Ascorbic Acid (VITAMIN C) 100 MG tablet Take 100 mg by mouth daily.     BD ULTRA-FINE LANCETS lancets Use as instructed upto 6 times daily 600 each 2   benzonatate (TESSALON) 100 MG capsule Take  1 capsule (100 mg total) by mouth 3 (three) times daily as needed for cough. 30 capsule 3   carvedilol (COREG) 6.25 MG tablet Take 1 tablet (6.25 mg total) by mouth 2 (two) times daily with a meal. 60 tablet 3   Cholecalciferol (VITAMIN D3) 25 MCG (1000 UT) CAPS Take 1 capsule (1,000 Units total) by mouth daily. 30 capsule    clopidogrel (PLAVIX) 75 MG tablet TAKE ONE TABLET BY MOUTH EVERY EVENING 30 tablet 6   Continuous Blood Gluc Transmit (DEXCOM G6 TRANSMITTER) MISC 1 kit by Does not apply route See admin instructions. Use to check blood sugar 1 each 1   diclofenac Sodium (VOLTAREN) 1 % GEL Apply 2 g topically 3 (three) times daily. 100 g 3   glucagon 1 MG injection Inject 1 mg into the muscle once as needed for up to 1 dose. 1 each 12   glucose blood test strip Use as instructed to check glucose 4 times daily. 400 each 3   Insulin Aspart FlexPen (NOVOLOG) 100 UNIT/ML USE UP TO 50 UNITS DAILY in insulin pump 50 mL 0   insulin degludec (TRESIBA FLEXTOUCH) 100 UNIT/ML FlexTouch Pen Inject 46 Units into the skin daily. 36 mL 0   Insulin Pen Needle (PEN NEEDLES) 32G X 5 MM MISC Use 1x a day 100 each 3   losartan (COZAAR) 25 MG tablet TAKE ONE TABLET BY MOUTH ONCE DAILY 90 tablet 0   nitroGLYCERIN (NITROSTAT) 0.4 MG SL tablet Place 1 tablet (0.4 mg total) under the tongue every 5 (five) minutes as needed for chest pain. 25 tablet 1   rosuvastatin (CRESTOR) 20 MG tablet TAKE ONE TABLET BY MOUTH EVERY EVENING 90 tablet 0   SYNTHROID 75 MCG tablet TAKE ONE TABLET BY MOUTH monday-saturday before breakfast 80 tablet 1   topiramate (TOPAMAX) 50 MG tablet Take 1 tablet (50 mg total) by mouth 2 (two) times daily. 60 tablet 12   Ubrogepant (UBRELVY) 50 MG TABS Take 50 mg by mouth as needed. May repeat x 1 tab after 2 hours; max 2 tabs per day or 8 per month 8 tablet 6   venlafaxine (EFFEXOR) 37.5 MG tablet Take 37.5 mg once a day for a week then increase to 37.5 mg twice a day and continue that dose      rizatriptan (MAXALT-MLT) 10 MG disintegrating tablet Take 10 mg by mouth as needed.     No facility-administered medications prior to visit.     Per HPI unless specifically indicated in ROS section below Review of Systems  Constitutional:  Positive for fever (recent COVID). Negative for activity change, appetite change, chills, fatigue and unexpected weight change.  HENT:  Positive for congestion. Negative for hearing loss.   Eyes:  Positive  for visual disturbance.  Respiratory:  Positive for cough and shortness of breath. Negative for chest tightness and wheezing.   Cardiovascular:  Positive for chest pain (R sided - into chest) and palpitations. Negative for leg swelling.  Gastrointestinal:  Positive for abdominal pain (ongoing) and nausea (occ). Negative for abdominal distention, blood in stool, constipation, diarrhea and vomiting.  Genitourinary:  Negative for difficulty urinating, dysuria, frequency, hematuria and urgency.  Musculoskeletal:  Negative for arthralgias, myalgias and neck pain.  Skin:  Negative for rash.  Neurological:  Positive for headaches. Negative for dizziness, seizures and syncope.  Hematological:  Negative for adenopathy. Does not bruise/bleed easily.  Psychiatric/Behavioral:  Negative for dysphoric mood. The patient is not nervous/anxious.    Objective:  BP (!) 142/70    Pulse 93    Temp 98 F (36.7 C) (Temporal)    Ht 5' 3.75" (1.619 m)    Wt 155 lb 4 oz (70.4 kg)    SpO2 98%    BMI 26.86 kg/m   Wt Readings from Last 3 Encounters:  09/28/21 155 lb 4 oz (70.4 kg)  09/23/21 144 lb (65.3 kg)  09/14/21 150 lb (68 kg)      Physical Exam Vitals and nursing note reviewed.  Constitutional:      Appearance: Normal appearance. She is not ill-appearing.  HENT:     Head: Normocephalic and atraumatic.     Right Ear: Tympanic membrane, ear canal and external ear normal. There is no impacted cerumen.     Left Ear: Tympanic membrane, ear canal and external ear normal.  There is no impacted cerumen.  Eyes:     General:        Right eye: No discharge.        Left eye: No discharge.     Extraocular Movements: Extraocular movements intact.     Conjunctiva/sclera: Conjunctivae normal.     Pupils: Pupils are equal, round, and reactive to light.  Neck:     Thyroid: No thyroid mass or thyromegaly.     Vascular: Carotid bruit (mild L sided) present.  Cardiovascular:     Rate and Rhythm: Normal rate and regular rhythm.     Pulses: Normal pulses.     Heart sounds: Normal heart sounds. No murmur heard. Pulmonary:     Effort: Pulmonary effort is normal. No respiratory distress.     Breath sounds: Normal breath sounds. No wheezing, rhonchi or rales.  Abdominal:     General: Bowel sounds are normal. There is no distension.     Palpations: Abdomen is soft. There is no mass.     Tenderness: There is generalized abdominal tenderness (mild-mod). There is no guarding or rebound. Negative signs include Murphy's sign.     Hernia: No hernia is present.  Musculoskeletal:     Cervical back: Normal range of motion and neck supple. No rigidity.     Right lower leg: No edema.     Left lower leg: No edema.  Lymphadenopathy:     Cervical: No cervical adenopathy.  Skin:    General: Skin is warm and dry.     Findings: No rash.  Neurological:     General: No focal deficit present.     Mental Status: She is alert. Mental status is at baseline.  Psychiatric:        Mood and Affect: Mood normal.        Behavior: Behavior normal.      Results for orders placed or performed in visit  on 09/14/21  POC COVID-19  Result Value Ref Range   SARS Coronavirus 2 Ag Positive (A) Negative  POC Influenza A&B (Binax test)  Result Value Ref Range   Influenza A, POC Negative Negative   Influenza B, POC Negative Negative    Assessment & Plan:  This visit occurred during the SARS-CoV-2 public health emergency.  Safety protocols were in place, including screening questions prior to the  visit, additional usage of staff PPE, and extensive cleaning of exam room while observing appropriate contact time as indicated for disinfecting solutions.   Problem List Items Addressed This Visit     Health maintenance examination - Primary (Chronic)    Preventative protocols reviewed and updated unless pt declined. Discussed healthy diet and lifestyle.       Essential hypertension    Borderline control on carvedilol and losartan.       Hypothyroidism due to Hashimoto's thyroiditis    Followed by endo. Update TSH when she returns for labs       Relevant Orders   TSH   Hyperlipidemia associated with type 2 diabetes mellitus (HCC)    Chronic, stable on crestor 13m. Intolerant to ezetimibe. Update FLP. If doesn't reach LDL goal, would benefit from evaluation for PCSK9i eligibility.  The 10-year ASCVD risk score (Arnett DK, et al., 2019) is: 57.6%   Values used to calculate the score:     Age: [age]years     Sex: Female     Is Non-Hispanic African American: No     Diabetic: Yes     Tobacco smoker: No     Systolic Blood Pressure: 1660mmHg     Is BP treated: Yes     HDL Cholesterol: 41 mg/dL     Total Cholesterol: 155 mg/dL       Relevant Orders   Lipid panel   CK   Comprehensive metabolic panel   Vitamin D deficiency    Update levels on 1000 IU replacement      Relevant Orders   VITAMIN D 25 Hydroxy (Vit-D Deficiency, Fractures)   Carotid stenosis, 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    S/p LICA stent placement 2019, regularly sees [facility].       LADA (latent autoimmune diabetes in adults), managed as type 1 ([place]    Appreciate endo care.       Intractable episodic headache    Has seen Dr PLeta Baptistfor second opinion, started on topamax and ubrelvy.       Fibromyalgia    Ongoing chronic pain.  In h/o statin use, check CPK.       Relevant Orders   CK   Monckeberg's medial sclerosis   Raynaud disease    Could consider low dose amlodipine (higher dose caused pedal  edema)      PAD (peripheral artery disease) (HCC)    Continue statin, plavix.       Sjogren's syndrome without extraglandular involvement ([place]    Regularly sees eye doctor and dentist.  Consider re establishing with rheum (last seen 2020)      Atherosclerosis of aorta ([place]    Continue statin, plavix.         No orders of the defined types were placed in this encounter.  Orders Placed This Encounter  Procedures   Lipid panel    Standing Status:   Future    Standing Expiration Date:   09/29/2022   TSH    Standing Status:   Future    Standing Expiration  Date:   09/29/2022   CK    Standing Status:   Future    Standing Expiration Date:   09/29/2022   VITAMIN D 25 Hydroxy (Vit-D Deficiency, Fractures)    Standing Status:   Future    Standing Expiration Date:   09/29/2022   Comprehensive metabolic panel    Standing Status:   Future    Standing Expiration Date:   09/29/2022     Patient instructions: Schedule fasting labs in the next few days - you can get this done at the [facility] office.  Call to schedule bone density scan at your convenience: [facility] at [facility] [phone] If interested in shingles shot, check with pharmacy. Schedule dentist appointment.  Good to see you today Return as needed or in 4 months for follow up visit.   Follow up plan: Return in about 4 months (around 01/26/2022) for follow up visit.  [name], MD

## 2021-09-28 NOTE — Patient Instructions (Addendum)
Schedule fasting labs in the next few days - you can get this done at the [facility] office.  Call to schedule bone density scan at your convenience: [facility] at [facility] [phone]  If interested in shingles shot, check with pharmacy. Schedule dentist appointment.  Good to see you today Return as needed or in 4 months for follow up visit.   Health Maintenance After Age 44 After age 40, you are at a higher risk for certain long-term diseases and infections as well as injuries from falls. Falls are a major cause of broken bones and head injuries in people who are older than age 2. Getting regular preventive care can help to keep you healthy and well. Preventive care includes getting regular testing and making lifestyle changes as recommended by your health care provider. Talk with your health care provider about: Which screenings and tests you should have. A screening is a test that checks for a disease when you have no symptoms. A diet and exercise plan that is right for you. What should I know about screenings and tests to prevent falls? Screening and testing are the best ways to find a health problem early. Early diagnosis and treatment give you the best chance of managing medical conditions that are common after age 34. Certain conditions and lifestyle choices may make you more likely to have a fall. Your health care provider may recommend: Regular vision checks. Poor vision and conditions such as cataracts can make you more likely to have a fall. If you wear glasses, make sure to get your prescription updated if your vision changes. Medicine review. Work with your health care provider to regularly review all of the medicines you are taking, including over-the-counter medicines. Ask your health care provider about any side effects that may make you more likely to have a fall. Tell your health care provider if any medicines that you take make you feel dizzy or sleepy. Strength  and balance checks. Your health care provider may recommend certain tests to check your strength and balance while standing, walking, or changing positions. Foot health exam. Foot pain and numbness, as well as not wearing proper footwear, can make you more likely to have a fall. Screenings, including: Osteoporosis screening. Osteoporosis is a condition that causes the bones to get weaker and break more easily. Blood pressure screening. Blood pressure changes and medicines to control blood pressure can make you feel dizzy. Depression screening. You may be more likely to have a fall if you have a fear of falling, feel depressed, or feel unable to do activities that you used to do. Alcohol use screening. Using too much alcohol can affect your balance and may make you more likely to have a fall. Follow these instructions at home: Lifestyle Do not drink alcohol if: Your health care provider tells you not to drink. If you drink alcohol: Limit how much you have to: 0-1 drink a day for women. 0-2 drinks a day for men. Know how much alcohol is in your drink. In the [place] one drink equals one 12 oz bottle of beer (355 mL), one 5 oz glass of wine (148 mL), or one 1 oz glass of hard liquor (44 mL). Do not use any products that contain nicotine or tobacco. These products include cigarettes, chewing tobacco, and vaping devices, such as e-cigarettes. If you need help quitting, ask your health care provider. Activity  Follow a regular exercise program to stay fit. This will help you maintain your  balance. Ask your health care provider what types of exercise are appropriate for you. If you need a cane or walker, use it as recommended by your health care provider. Wear supportive shoes that have nonskid soles. Safety  Remove any tripping hazards, such as rugs, cords, and clutter. Install safety equipment such as grab bars in bathrooms and safety rails on stairs. Keep rooms and walkways well-lit. General  instructions Talk with your health care provider about your risks for falling. Tell your health care provider if: You fall. Be sure to tell your health care provider about all falls, even ones that seem minor. You feel dizzy, tiredness (fatigue), or off-balance. Take over-the-counter and prescription medicines only as told by your health care provider. These include supplements. Eat a healthy diet and maintain a healthy weight. A healthy diet includes low-fat dairy products, low-fat (lean) meats, and fiber from whole grains, beans, and lots of fruits and vegetables. Stay current with your vaccines. Schedule regular health, dental, and eye exams. Summary Having a healthy lifestyle and getting preventive care can help to protect your health and wellness after age 43. Screening and testing are the best way to find a health problem early and help you avoid having a fall. Early diagnosis and treatment give you the best chance for managing medical conditions that are more common for people who are older than age 62. Falls are a major cause of broken bones and head injuries in people who are older than age 83. Take precautions to prevent a fall at home. Work with your health care provider to learn what changes you can make to improve your health and wellness and to prevent falls. This information is not intended to replace advice given to you by your health care provider. Make sure you discuss any questions you have with your health care provider. Document Revised: 02/01/2021 Document Reviewed: 02/01/2021 Elsevier Patient Education  [place].

## 2021-09-29 ENCOUNTER — Encounter: Payer: Self-pay | Admitting: Family Medicine

## 2021-09-29 NOTE — Assessment & Plan Note (Signed)
Update levels on 1000 IU replacement

## 2021-09-29 NOTE — Assessment & Plan Note (Signed)
Continue statin, plavix.  

## 2021-09-29 NOTE — Assessment & Plan Note (Signed)
Has seen Dr Leta Baptist for second opinion, started on topamax and [place].

## 2021-09-29 NOTE — Assessment & Plan Note (Signed)
Ongoing chronic pain.  In h/o statin use, check CPK.

## 2021-09-29 NOTE — Assessment & Plan Note (Signed)
S/p LICA stent placement 2019, regularly sees [facility].

## 2021-09-29 NOTE — Assessment & Plan Note (Signed)
Could consider low dose amlodipine (higher dose caused pedal edema)

## 2021-09-29 NOTE — Assessment & Plan Note (Signed)
Appreciate endo care.  

## 2021-09-29 NOTE — Assessment & Plan Note (Addendum)
Regularly sees eye doctor and dentist.  Consider re establishing with rheum (last seen 2020)

## 2021-09-29 NOTE — Assessment & Plan Note (Signed)
Borderline control on carvedilol and losartan.

## 2021-09-29 NOTE — Assessment & Plan Note (Addendum)
Followed by endo. Update TSH when she returns for labs

## 2021-09-29 NOTE — Assessment & Plan Note (Signed)
Preventative protocols reviewed and updated unless pt declined. Discussed healthy diet and lifestyle.  

## 2021-09-29 NOTE — Assessment & Plan Note (Addendum)
Chronic, stable on crestor 20mg . Intolerant to ezetimibe. Update FLP. If doesn't reach LDL goal, would benefit from evaluation for PCSK9i eligibility.  The 10-year ASCVD risk score (Arnett DK, et al., 2019) is: 57.6%   Values used to calculate the score:     Age: 81 years     Sex: Female     Is Non-Hispanic African American: No     Diabetic: Yes     Tobacco smoker: No     Systolic Blood Pressure: 856 mmHg     Is BP treated: Yes     HDL Cholesterol: 41 mg/dL     Total Cholesterol: 155 mg/dL

## 2021-09-30 ENCOUNTER — Other Ambulatory Visit: Payer: Self-pay | Admitting: Family Medicine

## 2021-09-30 DIAGNOSIS — Z1231 Encounter for screening mammogram for malignant neoplasm of breast: Secondary | ICD-10-CM

## 2021-10-04 ENCOUNTER — Encounter: Payer: Self-pay | Admitting: Family Medicine

## 2021-10-08 ENCOUNTER — Encounter: Payer: Self-pay | Admitting: Family Medicine

## 2021-10-08 NOTE — Telephone Encounter (Signed)
Unable to reach pt on either contact # and no v/m. Per pts my chart note she may go to UC. Sending note to Dr Darnell Level and Lattie Haw CMA.an will teams lisa.

## 2021-10-14 ENCOUNTER — Other Ambulatory Visit: Payer: Self-pay | Admitting: Internal Medicine

## 2021-10-14 ENCOUNTER — Other Ambulatory Visit: Payer: Self-pay | Admitting: Family Medicine

## 2021-10-15 ENCOUNTER — Other Ambulatory Visit: Payer: Self-pay

## 2021-10-15 ENCOUNTER — Other Ambulatory Visit (INDEPENDENT_AMBULATORY_CARE_PROVIDER_SITE_OTHER): Payer: HMO

## 2021-10-15 DIAGNOSIS — E063 Autoimmune thyroiditis: Secondary | ICD-10-CM

## 2021-10-15 DIAGNOSIS — E785 Hyperlipidemia, unspecified: Secondary | ICD-10-CM | POA: Diagnosis not present

## 2021-10-15 DIAGNOSIS — E038 Other specified hypothyroidism: Secondary | ICD-10-CM

## 2021-10-15 DIAGNOSIS — M797 Fibromyalgia: Secondary | ICD-10-CM

## 2021-10-15 DIAGNOSIS — E1169 Type 2 diabetes mellitus with other specified complication: Secondary | ICD-10-CM

## 2021-10-15 DIAGNOSIS — E559 Vitamin D deficiency, unspecified: Secondary | ICD-10-CM | POA: Diagnosis not present

## 2021-10-15 LAB — VITAMIN D 25 HYDROXY (VIT D DEFICIENCY, FRACTURES): VITD: 26.7 ng/mL — ABNORMAL LOW (ref 30.00–100.00)

## 2021-10-15 LAB — COMPREHENSIVE METABOLIC PANEL
ALT: 11 U/L (ref 0–35)
AST: 14 U/L (ref 0–37)
Albumin: 3.8 g/dL (ref 3.5–5.2)
Alkaline Phosphatase: 80 U/L (ref 39–117)
BUN: 12 mg/dL (ref 6–23)
CO2: 28 mEq/L (ref 19–32)
Calcium: 9.3 mg/dL (ref 8.4–10.5)
Chloride: 107 mEq/L (ref 96–112)
Creatinine, Ser: 0.91 mg/dL (ref 0.40–1.20)
GFR: 60.09 mL/min (ref >60.00)
Glucose, Bld: 213 mg/dL — ABNORMAL HIGH (ref 70–99)
Potassium: 4 mEq/L (ref 3.5–5.1)
Sodium: 140 mEq/L (ref 135–145)
Total Bilirubin: 0.3 mg/dL (ref 0.2–1.2)
Total Protein: 6.1 g/dL (ref 6.0–8.3)

## 2021-10-15 LAB — LIPID PANEL
Cholesterol: 161 mg/dL (ref 0–200)
HDL: 43.3 mg/dL (ref >39.00)
LDL Cholesterol: 94 mg/dL (ref 0–99)
NonHDL: 117.98
Total CHOL/HDL Ratio: 4
Triglycerides: 118 mg/dL (ref 0.0–149.0)
VLDL: 23.6 mg/dL (ref 0.0–40.0)

## 2021-10-15 LAB — TSH: TSH: 1.33 u[IU]/mL (ref 0.35–5.50)

## 2021-10-15 LAB — CK: Total CK: 40 U/L (ref 7–177)

## 2021-10-18 ENCOUNTER — Encounter: Payer: Self-pay | Admitting: Family Medicine

## 2021-10-18 ENCOUNTER — Telehealth: Payer: Self-pay

## 2021-10-18 NOTE — Progress Notes (Addendum)
Chronic Care Management Pharmacy Assistant   Name: Cassandra Benson  MRN: [id number] DOB: Oct 30, 1940  Reason for Encounter: Medication Adherence and Delivery Coordination   Recent office visits:  09/28/21-PCP-Patient presented for AWV. Labs ordered( normal findings, vitamin D mildly low increase Vitamin D to 2000 units daily,LDL could be better)  discussed screenings, schedule DEXA,discussed vaccines, follow up 4 month.  Recent consult visits:  None since last CCM contact  Hospital visits:  None in previous 6 months  Medications: Outpatient Encounter Medications as of 10/18/2021  Medication Sig Note   acetaminophen (TYLENOL) 500 MG tablet Take 1 tablet (500 mg total) by mouth 3 (three) times daily as needed.    Ascorbic Acid (VITAMIN C) 100 MG tablet Take 100 mg by mouth daily.    BD ULTRA-FINE LANCETS lancets Use as instructed upto 6 times daily    benzonatate (TESSALON) 100 MG capsule Take 1 capsule (100 mg total) by mouth 3 (three) times daily as needed for cough.    carvedilol (COREG) 6.25 MG tablet TAKE ONE TABLET BY MOUTH twice daily with A meal    Cholecalciferol (VITAMIN D3) 25 MCG (1000 UT) CAPS Take 1 capsule (1,000 Units total) by mouth daily.    clopidogrel (PLAVIX) 75 MG tablet TAKE ONE TABLET BY MOUTH EVERY EVENING    Continuous Blood Gluc Transmit (DEXCOM G6 TRANSMITTER) MISC 1 kit by Does not apply route See admin instructions. Use to check blood sugar    diclofenac Sodium (VOLTAREN) 1 % GEL Apply 2 g topically 3 (three) times daily.    glucagon 1 MG injection Inject 1 mg into the muscle once as needed for up to 1 dose.    Insulin Aspart FlexPen (NOVOLOG) 100 UNIT/ML USE UP TO 50 UNITS DAILY in insulin pump    insulin degludec (TRESIBA FLEXTOUCH) 100 UNIT/ML FlexTouch Pen Inject 46 Units into the skin daily.    Insulin Pen Needle (PEN NEEDLES) 32G X 5 MM MISC Use 1x a day    losartan (COZAAR) 25 MG tablet TAKE ONE TABLET BY MOUTH ONCE DAILY    nitroGLYCERIN  (NITROSTAT) 0.4 MG SL tablet Place 1 tablet (0.4 mg total) under the tongue every 5 (five) minutes as needed for chest pain.    ONETOUCH ULTRA test strip USE TO check blood glucose FOUR TIMES DAILY    rosuvastatin (CRESTOR) 20 MG tablet TAKE ONE TABLET BY MOUTH EVERY EVENING    SYNTHROID 75 MCG tablet TAKE ONE TABLET BY MOUTH monday-saturday before breakfast    topiramate (TOPAMAX) 50 MG tablet Take 1 tablet (50 mg total) by mouth 2 (two) times daily.    Ubrogepant (UBRELVY) 50 MG TABS Take 50 mg by mouth as needed. May repeat x 1 tab after 2 hours; max 2 tabs per day or 8 per month 09/08/2021: 09/08/21 approved thru 09/08/2022   venlafaxine (EFFEXOR) 37.5 MG tablet Take 37.5 mg once a day for a week then increase to 37.5 mg twice a day and continue that dose    No facility-administered encounter medications on file as of 10/18/2021.    BP Readings from Last 3 Encounters:  09/28/21 (!) 142/70  09/14/21 121/65  09/08/21 126/84    Lab Results  Component Value Date   HGBA1C 10.0 (A) 08/02/2021      Recent OV, Consult or Hospital visit:  09/28/21-PCP-increase vitamin D to 2000 units daily     Last adherence delivery date:09/28/21      Patient is due for next adherence delivery on:  10/27/21  Spoke with patient on 10/18/21 reviewed medications and coordinated delivery.  This delivery to include: Vials( no safety caps)  30 Days  VIAL medications: [name] - 1 tablet every evening  Synthroid 75 mcg - 1 tablet daily Monday-Saturday (before breakfast) [name] - Inject 46 units daily Ubrelvey 42m- take 1 tablet at onset of headache,repeat in 2 hours as needed. Topiramate 563m take 1 tablet 2 times daily .  Patient declined the following medications this month: Novolog 100units- currently not using pump(malfunctioned) will discuss 11/09/21 with Dr.Gherghe. [name] - Use as directed ( supply on hand) Rosuvastatin 2074mhas 30 tablets left Losartan  25 mg- has 30 tablets left  Carvedilol 6.18m63mhas 30 tablets left  Preservision Reds- patient has enough supply  Venlafaxine 37.5mg-61matient only taking as needed at this time Vitamin D- take 2000 units daily ( gets OTC)  Any concerns about your medications? No  How often do you forget or accidentally miss a dose? Never  Do you use a pillbox? Yes Tried packaging process and did not like it.  No refill request needed.  Confirmed delivery date of 10/27/21, advised patient that pharmacy will contact them the morning of delivery.  Recent blood pressure readings are as follows: No readings available   Recent blood glucose readings are as follows: Fasting: No readings available (patient states they are high-appt with Endo 11/09/21)  Annual wellness visit in last year? Yes Most Recent BP reading:142/70  93-P  09/28/21  If Diabetic: Most recent A1C reading: 10.0 08/02/21 Last eye exam / retinopathy screening: 11/04/20 Last diabetic foot exam: 03/16/21  MicheDebbora Dus notified  VelmeAvel SensorA [place] Hillsstant [phone] reviewed the care management and care coordination activities outlined in this encounter and I am certifying that I agree with the content of this note. No further action required.  MicheDebbora DusrmD Clinical Pharmacist [phone]

## 2021-10-19 ENCOUNTER — Other Ambulatory Visit: Payer: HMO

## 2021-10-25 ENCOUNTER — Encounter: Payer: Self-pay | Admitting: Family Medicine

## 2021-11-01 NOTE — Progress Notes (Signed)
Cardiology Office Note  Date:  11/02/2021   ID:  Cassandra, Benson 12-01-1940, MRN [id number]  PCP:  [name], MD   Chief Complaint  Patient presents with   Chest pain     Patient c/o left arm pain that radiated into her chest about 1 week ago with still having episodes of chest pain, shortness of breath, right leg and ankle swelling and feeling fatigue. Medications reviewed by the patient verbally.     HPI:  Cassandra Benson is a very pleasant 81 year old woman with  Chronic chest pain 15 years of diabetes, on insulin,  Hyperlipidemia, prior episodes of chest pain,  80% stenosis of the left internal carotid artery.  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 CT scan: Aortic atherosclerosis, no significant coronary calcification noted Prior cardiac catheterization 12-18-98 no significant disease Poorly controlled diabetes Who presents for routine follow-up of recent episodes of chest pain  Last seen in clinic 5/22  Left shoulder and left chest pain last week Lasted 3 minutes Did not call EMS She is concerned of cardiac etiology Review of notes indicates prior similar symptoms, seen in the ER /hospital  Last stress test 03/2019 Low risk study, good perfusion  Very anxious about chest pain given family hx  emergency room March 2022 for chest pain, shoulder pain felt to be atypical in nature Atypical pain --CTA chest showed no PE or other acute abnormality.  We have reviewed cardiac CT images, Minimal aortic athero, minimal coronary calcification  Lab work reviewed A1C 10 Total chol 161, LDL 94  EKG personally reviewed by myself on todays visit Shows normal sinus rhythm rate 93 bpm no significant ST or T wave changes  Other past medical history reviewed  stressful Dec 19, 2015 husband died in 12/19/15, cancer Was diagnosed late spring, died  several months later from metastatic disease lonely at nighttime  Previous episodes of chest pain,  woke up with some chest discomfort.  Went to the emergency room, cardiac enzymes negative 3,  chest CT with no PE.   She does report prior cardiac catheterization in 1998-12-18 showing no significant disease CT scan done 04/2015 in the emergency room shows no coronary calcifications, minimal minimal descending aorta atherosclerosis    PMH:   has a past medical history of Acute diverticulitis (04/2021), Allergy, ANA positive, Arthritis, Carotid stenosis, asymptomatic (06/19/2015), Colon polyps, COVID-19 virus infection (09/14/2021), Dermatomyositis ([place]), Diabetes mellitus without complication ([place]), Diverticulosis, Family history of adverse reaction to anesthesia, Fibromyalgia, GERD (gastroesophageal reflux disease), Glaucoma, History of blood clots, History of chicken pox, History of diverticulitis, History of pericarditis 12-18-84), History of pneumonia 12-18-12), History of shingles, History of UTI, Hyperlipidemia, Hypertension, Hypothyroidism, Mixed connective tissue disease ([place]), Partial small bowel obstruction ([place]) (12/2019), Peptic ulcer, Pneumonia, PONV (postoperative nausea and vomiting), Raynaud's disease without gangrene, Shoulder pain (left), Sjogren's syndrome ([place]), Sleep apnea, 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  PSH:    Past Surgical History:  Procedure Laterality Date   ABDOMINAL HYSTERECTOMY  1978   fibroids and menorrhagia, ovaries remain   ARTERY BIOPSY Right 04/06/2018   Procedure: BIOPSY TEMPORAL ARTERY RIGHT;  Surgeon: Beverly Gust, MD;  Location: [place];  Service: ENT;  Laterality: Right;  Diabetic - insulin pump sleep apnea   [facility] normal per patient   COLONOSCOPY  2011-12-19   1 TA, 1 HP, very tortuous  colon ([place])   COLONOSCOPY  02/2020   TA, inflammatory polyp, mod diverticulosis, int/ext hemorrhoids  ([place]) no rpt recommended    COLONOSCOPY WITH ESOPHAGOGASTRODUODENOSCOPY (EGD)  03/2007   2 ulcers, benign polyp, rpt 5 yrs [facility])   JOINT REPLACEMENT Right    hip   PARTIAL HIP ARTHROPLASTY  2013   Right hip replacement   TONSILLECTOMY     TONSILLECTOMY AND ADENOIDECTOMY     TRANSCAROTID ARTERY REVASCULARIZATION  Left 07/23/2018   Procedure: TRANSCAROTID ARTERY REVASCULARIZATION;  Surgeon: Marty Heck, MD;  Location: [place];  Service: Vascular;  Laterality: Left;   TUBAL LIGATION     VAGINAL DELIVERY     x2, no complications    Current Outpatient Medications  Medication Sig Dispense Refill   acetaminophen (TYLENOL) 500 MG tablet Take 1 tablet (500 mg total) by mouth 3 (three) times daily as needed.     Ascorbic Acid (VITAMIN C) 100 MG tablet Take 100 mg by mouth daily.     BD ULTRA-FINE LANCETS lancets Use as instructed upto 6 times daily 600 each 2   benzonatate (TESSALON) 100 MG capsule Take 1 capsule (100 mg total) by mouth 3 (three) times daily as needed for cough. 30 capsule 3   Cholecalciferol (VITAMIN D3) 25 MCG (1000 UT) CAPS Take 1 capsule (1,000 Units total) by mouth daily. 30 capsule    clopidogrel (PLAVIX) 75 MG tablet TAKE ONE TABLET BY MOUTH EVERY EVENING 30 tablet 6   Continuous Blood Gluc Transmit (DEXCOM G6 TRANSMITTER) MISC 1 kit by Does not apply route See admin instructions. Use to check blood sugar 1 each 1   diclofenac Sodium (VOLTAREN) 1 % GEL Apply 2 g topically 3 (three) times daily. 100 g 3   glucagon 1 MG injection Inject 1 mg into the muscle once as needed for up to 1 dose. 1 each 12   Insulin Aspart FlexPen (NOVOLOG) 100 UNIT/ML USE UP TO 50 UNITS DAILY in insulin pump 50 mL 0   insulin degludec (TRESIBA FLEXTOUCH) 100 UNIT/ML FlexTouch Pen Inject 46 Units into the skin daily. 36 mL 0   Insulin Pen Needle (PEN NEEDLES) 32G X 5 MM MISC Use 1x a day 100 each 3   losartan (COZAAR) 25 MG tablet TAKE ONE TABLET BY MOUTH ONCE DAILY  90 tablet 0   nitroGLYCERIN (NITROSTAT) 0.4 MG SL tablet Place 1 tablet (0.4 mg total) under the tongue every 5 (five) minutes as needed for chest pain. 25 tablet 1   ONETOUCH ULTRA test strip USE TO check blood glucose FOUR TIMES DAILY 400 strip 2   rosuvastatin (CRESTOR) 20 MG tablet TAKE ONE TABLET BY MOUTH EVERY EVENING 90 tablet 0   SYNTHROID 75 MCG tablet TAKE ONE TABLET BY MOUTH monday-saturday before breakfast 80 tablet 1   topiramate (TOPAMAX) 50 MG tablet Take 1 tablet (50 mg total) by mouth 2 (two) times daily. 60 tablet 12   Ubrogepant (UBRELVY) 50 MG TABS Take 50 mg by mouth as needed. May repeat x 1 tab after 2 hours; max 2 tabs per day or 8 per month 8 tablet 6   venlafaxine (EFFEXOR) 37.5 MG tablet Take 37.5 mg once a day for a week then increase to 37.5 mg twice a day and continue that dose     carvedilol (COREG) 6.25 MG tablet TAKE ONE TABLET BY MOUTH twice daily with A meal (Patient not taking: Reported on 11/02/2021) 180 tablet 3   No current facility-administered medications for  this visit.    Allergies:   Iodinated contrast media, Penicillins, Amlodipine, Anectine [succinylcholine], Codeine, Gabapentin, Influenza vaccines, Nortriptyline, Pamelor [nortriptyline hcl], Valsartan, Zetia [ezetimibe], Erythromycin, and Sulfa antibiotics   Social History:  The patient  reports that she has never smoked. She has never used smokeless tobacco. She reports that she does not drink alcohol and does not use drugs.   Family History:   family history includes Alcohol abuse in her brother; Anuerysm in her sister; Breast cancer in her maternal aunt, maternal aunt, and sister; CAD in her sister; CAD (age of onset: 55) in her brother; CAD (age of onset: 22) in her brother; CAD (age of onset: 42) in her father and mother; COPD in her brother and mother; Depression in her grandchild; Diabetes in her brother, sister, and sister; Gallbladder disease in her maternal grandmother; [name] in her  mother; Hypertension in her maternal grandmother; Lupus in her mother and sister; Rheum arthritis in her mother; Seizures in her son; Stroke in her brother, maternal grandmother, and sister.    Review of Systems: Review of Systems  Constitutional:  Positive for malaise/fatigue.  HENT: Negative.    Respiratory: Negative.    Cardiovascular: Negative.   Gastrointestinal: Negative.   Musculoskeletal: Negative.   Neurological:  Positive for headaches.  Psychiatric/Behavioral: Negative.    All other systems reviewed and are negative.  PHYSICAL EXAM: VS:  BP 140/64 (BP Location: Left Arm, Patient Position: Sitting, Cuff Size: Normal)    Pulse 93    Ht '5\' 3"'  (1.6 m)    Wt 155 lb 6 oz (70.5 kg)    SpO2 98%    BMI 27.52 kg/m  , BMI Body mass index is 27.52 kg/m. Constitutional:  oriented to person, place, and time. No distress.  HENT:  Head: Grossly normal Eyes:  no discharge. No scleral icterus.  Neck: No JVD, no carotid bruits  Cardiovascular: Regular rate and rhythm, no murmurs appreciated Pulmonary/Chest: Clear to auscultation bilaterally, no wheezes or rails Abdominal: Soft.  no distension.  no tenderness.  Musculoskeletal: Normal range of motion Neurological:  normal muscle tone. Coordination normal. No atrophy Skin: Skin warm and dry Psychiatric: normal affect, pleasant  Recent Labs: 09/10/2021: B Natriuretic Peptide 71.8; Hemoglobin 13.7; Platelets 205 10/15/2021: ALT 11; BUN 12; Creatinine, Ser 0.91; Potassium 4.0; Sodium 140; TSH 1.33    Lipid Panel Lab Results  Component Value Date   CHOL 161 10/15/2021   HDL 43.30 10/15/2021   LDLCALC 94 10/15/2021   TRIG 118.0 10/15/2021      Wt Readings from Last 3 Encounters:  11/02/21 155 lb 6 oz (70.5 kg)  09/28/21 155 lb 4 oz (70.4 kg)  09/23/21 144 lb (65.3 kg)     ASSESSMENT AND PLAN:  Essential hypertension -  Blood pressure is well controlled on today's visit. No changes made to the medications.  LADA (latent  autoimmune diabetes in adults), managed as type 1 ([place]) Working with endocrine A1C elevated, chronic issue Stressed the importance of working closely with endocrinology  bilateral carotid artery stenosis Tolerating Crestor 10 mg daily,  Side effects on zetia, stopped it  Adjustment disorder with mixed anxiety and depressed mood Walking less Now having shortness of breath on exertion Recommend she restart her walking program  Chest pain Typical and atypical features Prior stress test no ischemia over 2 years ago Getting more SOB, episodes of chest pain shoulder pain as she has had previously Cardiac CTA ordered, test discussed with her in detail  Pure  hypercholesterolemia LDL  stable, stable on crestor  Chronic headaches Prior MRI Findings of chronic ischemic microangiopathy and generalized volume loss without a clear lobar predilection.    Total encounter time more than 40 minutes  Greater than 50% was spent in counseling and coordination of care with the patient   No orders of the defined types were placed in this encounter.    Signed, Esmond Plants, M.D., Ph.D. 11/02/2021  [place], [place]

## 2021-11-02 ENCOUNTER — Ambulatory Visit (INDEPENDENT_AMBULATORY_CARE_PROVIDER_SITE_OTHER): Payer: HMO | Admitting: Cardiovascular Disease

## 2021-11-02 ENCOUNTER — Other Ambulatory Visit: Payer: Self-pay

## 2021-11-02 ENCOUNTER — Encounter: Payer: Self-pay | Admitting: Cardiovascular Disease

## 2021-11-02 VITALS — BP 140/64 | HR 93 | Ht 63.0 in | Wt 155.4 lb

## 2021-11-02 DIAGNOSIS — I872 Venous insufficiency (chronic) (peripheral): Secondary | ICD-10-CM | POA: Diagnosis not present

## 2021-11-02 DIAGNOSIS — I6523 Occlusion and stenosis of bilateral carotid arteries: Secondary | ICD-10-CM | POA: Diagnosis not present

## 2021-11-02 DIAGNOSIS — I739 Peripheral vascular disease, unspecified: Secondary | ICD-10-CM | POA: Diagnosis not present

## 2021-11-02 DIAGNOSIS — Z794 Long term (current) use of insulin: Secondary | ICD-10-CM

## 2021-11-02 DIAGNOSIS — I10 Essential (primary) hypertension: Secondary | ICD-10-CM

## 2021-11-02 DIAGNOSIS — R079 Chest pain, unspecified: Secondary | ICD-10-CM | POA: Diagnosis not present

## 2021-11-02 DIAGNOSIS — R072 Precordial pain: Secondary | ICD-10-CM

## 2021-11-02 DIAGNOSIS — E785 Hyperlipidemia, unspecified: Secondary | ICD-10-CM | POA: Diagnosis not present

## 2021-11-02 DIAGNOSIS — E782 Mixed hyperlipidemia: Secondary | ICD-10-CM

## 2021-11-02 DIAGNOSIS — E1169 Type 2 diabetes mellitus with other specified complication: Secondary | ICD-10-CM | POA: Diagnosis not present

## 2021-11-02 MED ORDER — PREDNISONE 50 MG PO TABS: ORAL_TABLET | ORAL | 0 refills | Status: DC

## 2021-11-02 MED ORDER — METOPROLOL TARTRATE 100 MG PO TABS: ORAL_TABLET | ORAL | 0 refills | Status: DC

## 2021-11-02 NOTE — Patient Instructions (Addendum)
Medication Instructions:  No changes  If you need a refill on your cardiac medications before your next appointment, please call your pharmacy.   Lab work: No new labs needed  Testing/Procedures:  1) Cardiac CT angiogram: (Chest pain/ shortness of breath)  - Your physician has requested that you have cardiac CT. Cardiac computed tomography (CT) is a painless test that uses an x-ray machine to take clear, detailed pictures of your heart.    Your cardiac CT will be scheduled at:   [facility] [address] [place], [place] [address] [phone]   Please arrive at the [facility] main entrance (entrance A) of [facility] 30 minutes prior to test start time. You can use the FREE valet parking offered at the main entrance (encouraged to control the heart rate for the test) Proceed to the [facility] Department (first floor) to check-in and test prep.   Please follow these instructions carefully (unless otherwise directed):   On the Night Before the Test: Be sure to Drink plenty of water. Do not consume any caffeinated/decaffeinated beverages or chocolate 12 hours prior to your test. Do not take any antihistamines 12 hours prior to your test.  Contrast allergy protocol:  Prednisone 50 mg - take 13 hours prior to test Take another Prednisone 50 mg 7 hours prior to test Take another Prednisone 50 mg 1 hour prior to test Take Benadryl 50 mg 1 hour prior to test You must complete all four doses of above prophylactic medications. You will need a ride after test due to Benadryl.  On the Day of the Test: Drink plenty of water until 1 hour prior to the test. Do not eat any food 4 hours prior to the test. You may take your regular medications prior to the test.  Take metoprolol (Lopressor) 100 mg two hours prior to test. FEMALES- please wear underwire-free bra if available, avoid dresses & tight clothing       After the Test: Drink plenty of  water. After receiving IV contrast, you may experience a mild flushed feeling. This is normal. On occasion, you may experience a mild rash up to 24 hours after the test. This is not dangerous. If this occurs, you can take Benadryl 25 mg and increase your fluid intake. If you experience trouble breathing, this can be serious. If it is severe call 911 IMMEDIATELY. If it is mild, please call our office. If you take any of these medications: Glipizide/Metformin, Avandament, Glucavance, please do not take 48 hours after completing test unless otherwise instructed.  We will call to schedule your test 2-4 weeks out understanding that some insurance companies will need an authorization prior to the service being performed.   For non-scheduling related questions, please contact the cardiac imaging nurse navigator should you have any questions/concerns: Marchia Bond, Cardiac Imaging Nurse Navigator Gordy Clement, Cardiac Imaging Nurse Navigator [facility] Direct Office Dial: [phone]   For scheduling needs, including cancellations and rescheduling, please call [place], [phone].    Follow-Up: At [facility], you and your health needs are our priority.  As part of our continuing mission to provide you with exceptional heart care, we have created designated Provider Care Teams.  These Care Teams include your primary Cardiologist (physician) and Advanced Practice Providers (APPs -  Physician Assistants and Nurse Practitioners) who all work together to provide you with the care you need, when you need it.  You will need a follow up appointment  as needed pending the results of your Cardiac CT scan  Providers on your designated Care Team:   Murray Hodgkins, NP Christell Faith, PA-C Cadence Kathlen Mody, [place]  COVID-19 Vaccine Information can be found at: [email or web address].uk For questions related to vaccine  distribution or appointments, please email [email or web address] or call [phone].

## 2021-11-03 ENCOUNTER — Encounter: Payer: Self-pay | Admitting: Family Medicine

## 2021-11-04 MED ORDER — CARVEDILOL 3.125 MG PO TABS: 3.1250 mg | ORAL_TABLET | Freq: Two times a day (BID) | ORAL | 6 refills | Status: DC

## 2021-11-04 NOTE — Addendum Note (Signed)
Addended by: [name] on: 11/04/2021 11:10 AM   Modules accepted: Orders

## 2021-11-09 ENCOUNTER — Encounter: Payer: Self-pay | Admitting: Internal Medicine

## 2021-11-09 ENCOUNTER — Ambulatory Visit (INDEPENDENT_AMBULATORY_CARE_PROVIDER_SITE_OTHER): Payer: HMO | Admitting: Internal Medicine

## 2021-11-09 ENCOUNTER — Other Ambulatory Visit: Payer: Self-pay

## 2021-11-09 VITALS — BP 130/68 | HR 78 | Ht 63.0 in | Wt 153.8 lb

## 2021-11-09 DIAGNOSIS — E782 Mixed hyperlipidemia: Secondary | ICD-10-CM

## 2021-11-09 DIAGNOSIS — E038 Other specified hypothyroidism: Secondary | ICD-10-CM | POA: Diagnosis not present

## 2021-11-09 DIAGNOSIS — E063 Autoimmune thyroiditis: Secondary | ICD-10-CM | POA: Diagnosis not present

## 2021-11-09 DIAGNOSIS — E139 Other specified diabetes mellitus without complications: Secondary | ICD-10-CM

## 2021-11-09 LAB — POCT GLYCOSYLATED HEMOGLOBIN (HGB A1C): Hemoglobin A1C: 10.3 % — AB (ref 4.0–5.6)

## 2021-11-09 NOTE — Patient Instructions (Addendum)
Please continue: - Tresiba 46 units in am  - Novolog: ICR: 1:15 (carb equivalents) Target: 130 Insulin sensitivity factor: 20 130-150: +1 unit 151-170: +2 units 171-190: +3 units 191-210: +4 units 211-230: +5 units 231-250: +6 units >250: +7 units If you correct sugars at bedtime, only correct >200 and only give 2 to 3 units.  Continue Novolog before coffee: 5 units.  Try to attach the Dexcom.  Please schedule an appt with Antonieta Iba with nutrition.  Please return in 3 months.

## 2021-11-09 NOTE — Progress Notes (Signed)
Patient ID: Cassandra Benson, female   DOB: 1941/09/09, 81 y.o.   MRN: [id number]   This visit occurred during the SARS-CoV-2 public health emergency.  Safety protocols were in place, including screening questions prior to the visit, additional usage of staff PPE, and extensive cleaning of exam room while observing appropriate contact time as indicated for disinfecting solutions.   HPI: Cassandra Benson is a 81 y.o.-year-old female, returning for f/u for LADA, initially dx'ed in 1998 (81 y/o), started insulin at dx, started insulin pump in ~2008, uncontrolled, without complications and also Hashimoto's hypothyroidism. She previously saw endocrinology at [place] (Dr. Janese Banks) and Dr Howell Rucks. Last visit with me 3 months ago.  Interim history: She continues to have headaches, muscle, joint aches. She also has chronic blurry vision (most in R eye), headaches-on Emgality once a month, Effexor.  Sees neurology.   She had COVID-19 in 08/2021.  She recovered well.  Reviewed HbA1c levels: Lab Results  Component Value Date   HGBA1C 10.0 (A) 08/02/2021   HGBA1C 9.7 (A) 12/03/2020   HGBA1C 12.0 (A) 09/04/2020   She is on: - Tresiba 20-22 units in am ... >> 40 >> 46 units daily - Novolog: ICR: 1:8 (starches)-1:10 (other foods), but mostly using carb equivalents >> 1:15 (5-10) units per meal Target: 130 Insulin sensitivity factor: 20 If you correct sugars at bedtime, only correct >200 and only give 2 to 3 units. Use 5 units of NovoLog before coffee - only drinking coffee 2x a week. She was previously using Glucophage ER d.a.w. but had to stop because this was not covered by her insurance.  She tried the generic metformin ER and this caused diarrhea so she had to stop.  Insulin pump:  -Previously: Medtronic 723-started 09/2016 (changed 07/2017), without CGM.   She was using [employer] for supplies before, but she is then forced by insurance to use [employer].  She came off the pump in the past as she  had a lot of problems getting supplies from them. -then T:slim x2 - started 2020 - stopped since last OV  CGM: -Dexcom G6 >> off now  Prev. Pump settings: - basal rates: 12 am: 0.800 units/h >> 0.950 3 am: 0.900 >> 1.000 9:30 am: 0.650 >> 0.750 5 pm: 1.300 6:30 pm: 1.200 11:30 pm: 1.200 - ICR: 1:8 - target: 110 - ISF:  12 am: 20 8 pm: 30 - Active insulin time: 4h TDD from basal insulin: 52% (18 units) >> 55% (18 units) TDD from bolus insulin: 48% (17 units) >> 45% (15 units) TDD: 35 to 50 units >> 35-50 >> up to 50 units  - extended bolusing: not using - changes infusion site: q3 days  She checks her sugars 0-1x a day: - am: 150s (115-170) >> 200-300 >> 115, 147-333 >> see above - 2h after b'fast: n/c - lunch: 188-239 >> 345 >> n/c >> see above >>180-286, 323 (cereal for dinner) - 2h after lunch: 292 >> n/c >> 181 >> n/c - dinner: 85 >> 145-160 >> 175-215 >> 72 >> n/c - 2h after dinner: 128, 270 >> n/c - bedtime: 255 >> n/c >> 115-175 >> n/c - nighttime: 72 - once a month Lowest sugar was 45 x1 >> 72 >> 101; she has hypoglycemia awareness in the 81 y.o.s.  No history of hypoglycemia admissions.  She has a glucagon kit at home. Highest sugar was 385 >> 449 >> 323; no history of DKA admissions.  Pt's meals are: - Breakfast: protein drink + almond  milk - usually skips - Lunch: PB jelly sandwich or sandwich with ham or cream cheese sandwich - Dinner: salads  - Snacks: pretzels; pork tenderloin + veggies; chicken + tenderloin No sodas.  She stopped artificial sweeteners (sweet and low) lipids improved.  No CKD, last BUN/creatinine:  Lab Results  Component Value Date   BUN 12 10/15/2021   BUN 11 09/10/2021   CREATININE 0.91 10/15/2021   CREATININE 0.78 09/10/2021  On lisinopril.  + HL;  last set of lipids: Lab Results  Component Value Date   CHOL 161 10/15/2021   HDL 43.30 10/15/2021   LDLCALC 94 10/15/2021   LDLDIRECT 113.0 09/09/2020   TRIG 118.0 10/15/2021    CHOLHDL 4 10/15/2021  On Crestor 10 -she continues to have muscle cramps despite using CoQ10.  She takes [employer].  Praluent was not covered.  - last eye exam was in 06/2021: No DR, macular degeneration worse - -on Areds She previously saw an ophthalmologist in [place] and would like to establishing care and [place].   - No numbness and tingling in her feet.  She was admitted for CP + SOB 10/01/2017.  Cardiac events and PE were ruled out.  She is seeing cardiology (Dr. Rockey Situ). She had right carotid ultrasound in 2020 and there is a 39% stenosis, significantly increased since 2018.  She has a history of left carotid endarterectomy. She was started on compression hoses by [facility].  These helped. 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  Hypothyroidism: -Due to Hashimoto's thyroiditis -Positive family history of Graves' disease in her mother -She was initially on Levoxyl, but she thought Levoxyl increased her lipid levels and caused hair loss, so we changed to Synthroid.  She feels better on Synthroid.  She is currently on Synthroid 75 mcg 6 out of 7 days: - in am - fasting - at least 30 min from b'fast - no calcium - no iron - + multivitamins at night - no PPIs - not on Biotin  Reviewed her TFTs: Lab Results  Component Value Date   TSH 1.33 10/15/2021   TSH 2.16 09/09/2020   TSH 1.73 11/27/2019   TSH 0.12 (L) 10/07/2019   TSH 0.22 (L) 09/10/2019   TSH 1.10 11/06/2018   TSH 0.09 (L) 08/29/2018   TSH 0.92 11/27/2017   TSH 0.30 (L) 10/11/2017   TSH 0.24 (L) 08/11/2017   She also has a history of SLE and Sjogren's syndrome.  She has generalized muscle aches and joint pains. In 2015, we checked her hormone levels in the setting of hirsutism and and they were normal.  Free testosterone, LH, FSH, DHEA-S, dexamethasone suppression test and estradiol were all normal.  Also, an AM cortisol and ACTH levels were  normal. She was admitted 01/15/2020 with SBO. She was also diagnosed with mild cognitive impairment and was started on Namenda last year. 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  She had negative biopsies in the past.  Not on steroids.  She is on Lyrica and Effexor.   She was in the emergency room for headaches and neck pain on 11/19/2020.  ROS: + See HPI Musculoskeletal: + muscle aches/+ joint aches Skin: + acne, which she attributes to NovoLog Neurological:  + HAs  I reviewed pt's medications, allergies, PMH, social hx, family hx, and changes were documented in the history of present illness. Otherwise, unchanged from my initial visit note.  Past Medical History:  Diagnosis Date   Acute diverticulitis 04/2021   [facility] ER, CT confirmed   Allergy    ANA positive    positive ANA pattern 1 speckled   Arthritis    Carotid stenosis, asymptomatic 06/19/2015   8-28% RICA 00-34% LICA rpt 1 yr (05/1790)    Colon polyps    COVID-19 virus infection 09/14/2021   Dermatomyositis ([place])    Diabetes mellitus without complication ([place])    Type 1   Diverticulosis    sigmoid on CT scan 12/2019   Family history of adverse reaction to anesthesia    brothr went into cardiac arrest from anectine   Fibromyalgia    prior PCP   GERD (gastroesophageal reflux disease)    prior PCP   Glaucoma    Narrow angle   History of blood clots    DVT, in 20s, none since   History of chicken pox    History of diverticulitis    History of pericarditis 1986   with hospitalization   History of pneumonia 2014   History of shingles    History of UTI    Hyperlipidemia    Hypertension    Hypothyroidism    Mixed connective tissue disease ([place])    Partial small bowel obstruction ([place]) 12/2019   managed conservatively   Peptic ulcer    Pneumonia    PONV (postoperative nausea and vomiting)    Raynaud's disease without gangrene    Shoulder pain left   h/o RTC tendonitis and adhesive  capsulitis   Sjogren's syndrome ([place])    Sleep apnea    prior PCP - no CPAP for about 10 yrs   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[place])    Vitamin D deficiency    prior PCP   Past Surgical History:  Procedure Laterality Date   ABDOMINAL HYSTERECTOMY  1978   fibroids and menorrhagia, ovaries remain   ARTERY BIOPSY Right 04/06/2018   Procedure: BIOPSY TEMPORAL ARTERY RIGHT;  Surgeon: Beverly Gust, MD;  Location: [facility];  Service: ENT;  Laterality: Right;  Diabetic - insulin pump sleep apnea   [facility] normal per patient   COLONOSCOPY  10/2011   1 TA, 1 HP, very tortuous colon ([place])   COLONOSCOPY  02/2020   TA, inflammatory polyp, mod diverticulosis, int/ext hemorrhoids ([place]) no rpt recommended    COLONOSCOPY WITH ESOPHAGOGASTRODUODENOSCOPY (EGD)  03/2007   2 ulcers, benign polyp, rpt 5 yrs [facility])   JOINT REPLACEMENT Right    hip   PARTIAL HIP ARTHROPLASTY  2013   Right hip replacement   TONSILLECTOMY     TONSILLECTOMY AND ADENOIDECTOMY     TRANSCAROTID ARTERY REVASCULARIZATION  Left 07/23/2018   Procedure: TRANSCAROTID ARTERY REVASCULARIZATION;  Surgeon: Marty Heck, MD;  Location: [facility];  Service: Vascular;  Laterality: Left;   TUBAL LIGATION     VAGINAL DELIVERY     x2, no complications   Social History   Social History   Widowed    Number of children: 2   Occupational History  Retired    Social History Main Topics   Smoking status: Never Smoker   Smokeless tobacco: Never Used   Alcohol use No   Drug use: No   Social History Narrative   Lives in [place] now - moved from [place].   No pets.   Mother of Peggi Yono.   Grandson committed suicide in [place]    Work - retired, prior Production manager  Hobbies - works with her church, [employer]   Exercise - limited   Diet - good water, fruits/vegetables daily, limited meat, protein drink every morning   Current Outpatient  Medications on File Prior to Visit  Medication Sig Dispense Refill   acetaminophen (TYLENOL) 500 MG tablet Take 1 tablet (500 mg total) by mouth 3 (three) times daily as needed.     Ascorbic Acid (VITAMIN C) 100 MG tablet Take 100 mg by mouth daily.     BD ULTRA-FINE LANCETS lancets Use as instructed upto 6 times daily 600 each 2   benzonatate (TESSALON) 100 MG capsule Take 1 capsule (100 mg total) by mouth 3 (three) times daily as needed for cough. 30 capsule 3   carvedilol (COREG) 3.125 MG tablet Take 1 tablet (3.125 mg total) by mouth 2 (two) times daily with a meal. 60 tablet 6   Cholecalciferol (VITAMIN D3) 25 MCG (1000 UT) CAPS Take 1 capsule (1,000 Units total) by mouth daily. 30 capsule    clopidogrel (PLAVIX) 75 MG tablet TAKE ONE TABLET BY MOUTH EVERY EVENING 30 tablet 6   Continuous Blood Gluc Transmit (DEXCOM G6 TRANSMITTER) MISC 1 kit by Does not apply route See admin instructions. Use to check blood sugar 1 each 1   diclofenac Sodium (VOLTAREN) 1 % GEL Apply 2 g topically 3 (three) times daily. 100 g 3   glucagon 1 MG injection Inject 1 mg into the muscle once as needed for up to 1 dose. 1 each 12   Insulin Aspart FlexPen (NOVOLOG) 100 UNIT/ML USE UP TO 50 UNITS DAILY in insulin pump 50 mL 0   insulin degludec (TRESIBA FLEXTOUCH) 100 UNIT/ML FlexTouch Pen Inject 46 Units into the skin daily. 36 mL 0   Insulin Pen Needle (PEN NEEDLES) 32G X 5 MM MISC Use 1x a day 100 each 3   losartan (COZAAR) 25 MG tablet TAKE ONE TABLET BY MOUTH ONCE DAILY 90 tablet 0   metoprolol tartrate (LOPRESSOR) 100 MG tablet Take 1 tablet (100 mg) by mouth 2 hours prior to your Cardiac CT 1 tablet 0   nitroGLYCERIN (NITROSTAT) 0.4 MG SL tablet Place 1 tablet (0.4 mg total) under the tongue every 5 (five) minutes as needed for chest pain. 25 tablet 1   ONETOUCH ULTRA test strip USE TO check blood glucose FOUR TIMES DAILY 400 strip 2   predniSONE (DELTASONE) 50 MG tablet Take 1 tablet (50 mg) 13 hours prior to  test, take 1 tablet (50 mg) 7 hours prior to test, take 1 tablet (50 mg) 1 hour prior to test 3 tablet 0   rosuvastatin (CRESTOR) 20 MG tablet TAKE ONE TABLET BY MOUTH EVERY EVENING 90 tablet 0   SYNTHROID 75 MCG tablet TAKE ONE TABLET BY MOUTH monday-saturday before breakfast 80 tablet 1   topiramate (TOPAMAX) 50 MG tablet Take 1 tablet (50 mg total) by mouth 2 (two) times daily. 60 tablet 12   Ubrogepant (UBRELVY) 50 MG TABS Take 50 mg by mouth as needed. May repeat x 1 tab after 2 hours; max 2 tabs per day or 8 per month 8 tablet 6   venlafaxine (EFFEXOR) 37.5 MG tablet Take 37.5 mg once a day for a week then increase to 37.5 mg twice a day and continue that dose     No current facility-administered medications on file prior to visit.   Allergies  Allergen Reactions   Iodinated Contrast Media Other (See Comments)    Itching (severe) and chest tightness  Penicillins Anaphylaxis, Swelling, Rash and Other (See Comments)    Has patient had a PCN reaction causing immediate rash, facial/tongue/throat swelling, SOB or lightheadedness with hypotension: Yes Has patient had a PCN reaction causing severe rash involving mucus membranes or skin necrosis: yes - remotely Has patient had a PCN reaction that required hospitalization: occurred while hospitalized Has patient had a PCN reaction occurring within the last 10 years: No If all of the above answers are "NO", then may proceed with Cephalosporin use.    Amlodipine Swelling    Pedal edema   Anectine [Succinylcholine] Other (See Comments)    Brother went into cardiac arrest.   Codeine Nausea Only   Gabapentin Other (See Comments)    Gait abnormality   Influenza Vaccines Other (See Comments)    Muscle weakness; unable to walk   Nortriptyline Other (See Comments)    Eye swelling and mouth drawed up   Pamelor [Nortriptyline Hcl] Other (See Comments)    Patient states caused her face to draw in together.   Valsartan Other (See Comments) and  Cough    Allergy to generic only, "Hacking" cough   Zetia [Ezetimibe] Other (See Comments)    Bad muscle cramps   Erythromycin Rash and Swelling   Sulfa Antibiotics Rash   Family History  Problem Relation Age of Onset   CAD Mother 71       MI, aortic valve issues   COPD Mother    Lupus Mother    [name] Mother    Rheum arthritis Mother    CAD Father 66       CABG x2, aortic valve replacement   Stroke Sister    CAD Sister    29 Sister        brain   Lupus Sister    Diabetes Sister    Alcohol abuse Brother    CAD Brother 55       MI   Stroke Brother    Seizures Son    COPD Brother        agent orange   CAD Brother 77       stent   Diabetes Brother    Depression Grandchild    Breast cancer Maternal Aunt    Diabetes Sister    Breast cancer Sister    Breast cancer Maternal Aunt    Stroke Maternal Grandmother    Hypertension Maternal Grandmother    Gallbladder disease Maternal Grandmother    Colon cancer Neg Hx    Esophageal cancer Neg Hx    Rectal cancer Neg Hx    Stomach cancer Neg Hx    PE: BP 130/68 (BP Location: Right Arm, Patient Position: Sitting, Cuff Size: Normal)    Pulse 78    Ht _0  (1.6 m)    Wt 153 lb 12.8 oz (69.8 kg)    SpO2 98%    BMI 27.24 kg/m  Wt Readings from Last 3 Encounters:  11/09/21 153 lb 12.8 oz (69.8 kg)  11/02/21 155 lb 6 oz (70.5 kg)  09/28/21 155 lb 4 oz (70.4 kg)   Constitutional: normal weight, in NAD Eyes: PERRLA, EOMI, no exophthalmos ENT: moist mucous membranes, no thyromegaly, no cervical lymphadenopathy Cardiovascular: RRR, No MRG Respiratory: CTA B Musculoskeletal: no deformities, strength intact in all 4 Skin: moist, warm, no rashes Neurological: no tremor with outstretched hands, DTR normal in all 4  ASSESSMENT: 1. DM1, uncontrolled, without long-term complications, but with hyperglycemia  2.  Hashimoto's hypothyroidism  3. HL  PLAN:  1. Patient with uncontrolled LADA, on basal-bolus insulin  regimen, after she came off her insulin pump.  She wanted to continue with the insulin pump but had problems obtaining supplies in the past.  Of note, she also could not restart the Dexcom CGM.  After she switched to [place] and NovoLog injectable regimen, sugars started to worsen.  Latest HbA1c from last visit was 10.0%.  At that time, we discussed about going back on the pump but she was not able to do so.  We did not change her regimen at last visit but I advised her to add 5 units of NovoLog before coffee and to increase the dose if needed if sugars remain high midday.  At that time, the sugars were in the 200s and 300s.  These are the first blood sugars of the day. -At today's visit, she only checked few times lately, all of them midday, before the first meal of her day.  These ranged between 180s and 300.  However, without further information, it is difficult to make changes in her regimen.  She mentions that she does not forget to take her insulin and in fact takes this consist doses.   -At this visit she tells me that she does have the Dexcom CGM supplies at home but she was not sure about how to attach them.  I am referring her her to diabetes education to attach the sensor.  It would be ideal if she can also go back to the pump.  She was not able to get supplies in the past but she is determined to go to [employer] with this issue. -For now, we will continue the current regimen but I am hoping to get more information about her blood sugars after she attaches the CGM. - I advised her to:  Patient Instructions  Please continue: - Tresiba 46 units in am  - Novolog: ICR: 1:15 (carb equivalents) Target: 130 Insulin sensitivity factor: 20 130-150: +1 unit 151-170: +2 units 171-190: +3 units 191-210: +4 units 211-230: +5 units 231-250: +6 units >250: +7 units If you correct sugars at bedtime, only correct >200 and only give 2 to 3 units.  Continue Novolog before coffee: 5 units.  Please  return in 3 months with your sugar log.    - we checked her HbA1c: 10.3% (even higher) - advised to check sugars at different times of the day - 4x a day, rotating check times - advised for yearly eye exams >> she is UTD - return to clinic in 3 months  2.  Hashimoto's hypothyroidism -She continues to have a palpable thyroid, most likely due to inflammation in the setting of Hashimoto's thyroiditis - latest thyroid labs reviewed with pt. >> normal: Lab Results  Component Value Date   TSH 1.33 10/15/2021  - she continues on LT4 75 mcg 6 out of 7 days - pt feels good on this dose. - we discussed about taking the thyroid hormone every day, with water, >30 minutes before breakfast, separated by >4 hours from acid reflux medications, calcium, iron, multivitamins. Pt. is taking it correctly.  3. HL -Reviewed latest lipid panel from 09/2021: LDL above our target of less than 70: Lab Results  Component Value Date   CHOL 161 10/15/2021   HDL 43.30 10/15/2021   LDLCALC 94 10/15/2021   LDLDIRECT 113.0 09/09/2020   TRIG 118.0 10/15/2021   CHOLHDL 4 10/15/2021  -She continues on Crestor 10 mg daily.  She does have generalized muscle  aches but she does not feel that these are from the statin.  Philemon Kingdom, MD PhD [facility]

## 2021-11-11 ENCOUNTER — Encounter: Payer: Self-pay | Admitting: Family Medicine

## 2021-11-11 ENCOUNTER — Telehealth (HOSPITAL_COMMUNITY): Payer: Self-pay | Admitting: *Deleted

## 2021-11-11 NOTE — Telephone Encounter (Signed)
Reaching out to patient to offer assistance regarding upcoming cardiac imaging study; pt verbalizes understanding of appt date/time, parking situation and where to check in, pre-test NPO status and medications ordered, and verified current allergies; name and call back number provided for further questions should they arise  Gordy Clement RN Navigator Cardiac Imaging [facility] [phone] office [phone] cell  Reviewed instructions of how take 13 hour prep and metoprolol tartrate for the test. She verbalized instructions.  She is aware to arrive at 2:45pm for her 3:15pm test.  She will have a driver.

## 2021-11-12 ENCOUNTER — Emergency Department (HOSPITAL_COMMUNITY): Payer: HMO

## 2021-11-12 ENCOUNTER — Emergency Department (HOSPITAL_COMMUNITY)
Admission: EM | Admit: 2021-11-12 | Discharge: 2021-11-13 | Disposition: A | Discharge status: Home / Self Care | Payer: HMO | Attending: Emergency Medicine | Admitting: Emergency Medicine

## 2021-11-12 ENCOUNTER — Encounter (HOSPITAL_COMMUNITY): Payer: Self-pay

## 2021-11-12 ENCOUNTER — Other Ambulatory Visit: Payer: Self-pay

## 2021-11-12 ENCOUNTER — Emergency Department (HOSPITAL_COMMUNITY)
Admission: RE | Admit: 2021-11-12 | Discharge: 2021-11-12 | Disposition: A | Discharge status: Home / Self Care | Payer: HMO | Source: Ambulatory Visit | Attending: Cardiovascular Disease | Admitting: Cardiovascular Disease

## 2021-11-12 ENCOUNTER — Encounter: Payer: Self-pay | Admitting: Internal Medicine

## 2021-11-12 DIAGNOSIS — G319 Degenerative disease of nervous system, unspecified: Secondary | ICD-10-CM | POA: Diagnosis not present

## 2021-11-12 DIAGNOSIS — Z043 Encounter for examination and observation following other accident: Secondary | ICD-10-CM | POA: Diagnosis not present

## 2021-11-12 DIAGNOSIS — M25572 Pain in left ankle and joints of left foot: Secondary | ICD-10-CM | POA: Diagnosis not present

## 2021-11-12 DIAGNOSIS — R072 Precordial pain: Secondary | ICD-10-CM | POA: Diagnosis not present

## 2021-11-12 DIAGNOSIS — M25551 Pain in right hip: Secondary | ICD-10-CM | POA: Insufficient documentation

## 2021-11-12 DIAGNOSIS — Y92002 Bathroom of unspecified non-institutional (private) residence single-family (private) house as the place of occurrence of the external cause: Secondary | ICD-10-CM | POA: Diagnosis not present

## 2021-11-12 DIAGNOSIS — W01198A Fall on same level from slipping, tripping and stumbling with subsequent striking against other object, initial encounter: Secondary | ICD-10-CM | POA: Insufficient documentation

## 2021-11-12 DIAGNOSIS — Z7902 Long term (current) use of antithrombotics/antiplatelets: Secondary | ICD-10-CM | POA: Diagnosis not present

## 2021-11-12 DIAGNOSIS — Z5321 Procedure and treatment not carried out due to patient leaving prior to being seen by health care provider: Secondary | ICD-10-CM | POA: Insufficient documentation

## 2021-11-12 DIAGNOSIS — S0990XA Unspecified injury of head, initial encounter: Secondary | ICD-10-CM | POA: Diagnosis present

## 2021-11-12 LAB — CBG MONITORING, ED: Glucose-Capillary: 195 mg/dL — ABNORMAL HIGH (ref 70–99)

## 2021-11-12 MED ORDER — METOPROLOL TARTRATE 5 MG/5ML IV SOLN
INTRAVENOUS | Status: AC
  Filled 2021-11-12: qty 5

## 2021-11-12 MED ORDER — IOHEXOL 350 MG/ML SOLN
95.0000 mL | Freq: Once | INTRAVENOUS | Status: AC | PRN
  Administered 2021-11-12: 95 mL via INTRAVENOUS

## 2021-11-12 MED ORDER — NITROGLYCERIN 0.4 MG SL SUBL
SUBLINGUAL_TABLET | SUBLINGUAL | Status: AC
  Filled 2021-11-12: qty 2

## 2021-11-12 MED ORDER — METOPROLOL TARTRATE 5 MG/5ML IV SOLN
5.0000 mg | INTRAVENOUS | Status: DC | PRN
  Administered 2021-11-12: 5 mg via INTRAVENOUS

## 2021-11-12 MED ORDER — NITROGLYCERIN 0.4 MG SL SUBL
0.8000 mg | SUBLINGUAL_TABLET | Freq: Once | SUBLINGUAL | Status: AC
Start: 2021-11-12 — End: 2021-11-12
  Administered 2021-11-12: 0.8 mg via SUBLINGUAL

## 2021-11-12 NOTE — ED Triage Notes (Signed)
Pt arrives POV for eval of L sided ankle pain. Pt reports she was on campus for a CT of her head, tripped in the bathroom and now have L ankle pain. Ambulatory to triage. Small skin tear to L ankle.

## 2021-11-12 NOTE — ED Provider Triage Note (Signed)
Emergency Medicine Provider Triage Evaluation Note  Cassandra Benson , a 81 y.o. female  was evaluated in triage.  Pt complains of fall. States that same occurred while she was here for a CT coronary.  States that she went to the bathroom and tripped and fell over a stool.  Unsure if she hit her head but denies any loss of consciousness.  Does state that she fell on her right hip.  Endorses right hip and left ankle pain. Has been able to ambulate since then with minimal pain. She is anticoagulated with Plavix.  Review of Systems  Positive: Negative: See above  Physical Exam  BP 130/65 (BP Location: Right Arm)    Pulse 75    Temp 97.6 F (36.4 C) (Oral)    Resp 17    Ht 5\' 3"  (1.6 m)    Wt 70 kg    SpO2 98%    BMI 27.34 kg/m  Gen:   Awake, no distress   Resp:  Normal effort  MSK:   Moves extremities without difficulty  Other:    Medical Decision Making  Medically screening exam initiated at 5:54 PM.  Appropriate orders placed.  Cassandra Benson was informed that the remainder of the evaluation will be completed by another provider, this initial triage assessment does not replace that evaluation, and the importance of remaining in the ED until their evaluation is complete.     Nestor Lewandowsky 11/12/21 1758

## 2021-11-13 ENCOUNTER — Encounter: Payer: Self-pay | Admitting: Family Medicine

## 2021-11-13 ENCOUNTER — Encounter: Payer: Self-pay | Admitting: Cardiovascular Disease

## 2021-11-13 NOTE — ED Notes (Signed)
Pt stated she didn't want to wait, left AMA

## 2021-11-14 ENCOUNTER — Encounter: Payer: Self-pay | Admitting: Cardiovascular Disease

## 2021-11-15 ENCOUNTER — Telehealth: Payer: Self-pay

## 2021-11-15 ENCOUNTER — Telehealth: Payer: Self-pay | Admitting: Dietician

## 2021-11-15 NOTE — Telephone Encounter (Signed)
Patient called this am and reported that she had fallen in the restroom at [facility] after a procedure on Friday and is unable to come to her appointment for [facility] training on 11/16/2021.  She states that she has a headache and blurred vision and would not be able to focus.  Called patient and tentatively set her Dexcom retraining appointment for March 2 at 1:00.  Antonieta Iba, RD, LDN, CDCES

## 2021-11-15 NOTE — Telephone Encounter (Signed)
Able to reach pt regarding her recent CCTA Dr. Rockey Situ had a chance to review her results and advised   "Cardiac CTA  No significant disease noted  Calcium score of 0  Normal coronary arteries "  Cassandra Benson very thankful for the phone call of her results, all questions and concerns were address with nothing further at this time. Will see at next schedule f/u appt.

## 2021-11-16 ENCOUNTER — Telehealth: Payer: Self-pay

## 2021-11-16 ENCOUNTER — Ambulatory Visit: Payer: HMO | Admitting: Dietician

## 2021-11-16 NOTE — Chronic Care Management (AMB) (Signed)
Chronic Care Management Pharmacy Assistant   Name: Cassandra Benson  MRN: [id number] DOB: [birth year]  Reason for Encounter: Medication Adherence and Delivery Coordination    Recent office visits:  None since last CCM contact  Recent consult visits:  11/12/21- [facility]-patient presented for left ankle pain-left due to wait time, AMA. 11/09/21-Internal Medicine-Cristine Gherghe,MD-Patient presented for follow up diabetes.New A1c 10.3- No medication changes 11/02/21-Cardiology-Timothy Gollan,MD-Patient presented for follow up chest pain.Recent scans discussed,EKG,discussed recent labs, Tolerating Crestor 10 mg daily, (upstream has different dose)Side effects on zetia, stopped it-no medication changes.New CT ordered  Hospital visits:  None since last CCM contact:    Medications: Outpatient Encounter Medications as of 11/16/2021  Medication Sig Note   acetaminophen (TYLENOL) 500 MG tablet Take 1 tablet (500 mg total) by mouth 3 (three) times daily as needed.    Ascorbic Acid (VITAMIN C) 100 MG tablet Take 100 mg by mouth daily.    BD ULTRA-FINE LANCETS lancets Use as instructed upto 6 times daily    benzonatate (TESSALON) 100 MG capsule Take 1 capsule (100 mg total) by mouth 3 (three) times daily as needed for cough.    carvedilol (COREG) 3.125 MG tablet Take 1 tablet (3.125 mg total) by mouth 2 (two) times daily with a meal.    Cholecalciferol (VITAMIN D3) 25 MCG (1000 UT) CAPS Take 1 capsule (1,000 Units total) by mouth daily.    clopidogrel (PLAVIX) 75 MG tablet TAKE ONE TABLET BY MOUTH EVERY EVENING    Continuous Blood Gluc Transmit (DEXCOM G6 TRANSMITTER) MISC 1 kit by Does not apply route See admin instructions. Use to check blood sugar (Patient not taking: Reported on 11/09/2021)    diclofenac Sodium (VOLTAREN) 1 % GEL Apply 2 g topically 3 (three) times daily.    glucagon 1 MG injection Inject 1 mg into the muscle once as needed for up to 1 dose.    Insulin Aspart FlexPen  (NOVOLOG) 100 UNIT/ML USE UP TO 50 UNITS DAILY in insulin pump (Patient taking differently: 10 - 12 units with each meal)    insulin degludec (TRESIBA FLEXTOUCH) 100 UNIT/ML FlexTouch Pen Inject 46 Units into the skin daily.    Insulin Pen Needle (PEN NEEDLES) 32G X 5 MM MISC Use 1x a day    losartan (COZAAR) 25 MG tablet TAKE ONE TABLET BY MOUTH ONCE DAILY    metoprolol tartrate (LOPRESSOR) 100 MG tablet Take 1 tablet (100 mg) by mouth 2 hours prior to your Cardiac CT    nitroGLYCERIN (NITROSTAT) 0.4 MG SL tablet Place 1 tablet (0.4 mg total) under the tongue every 5 (five) minutes as needed for chest pain.    ONETOUCH ULTRA test strip USE TO check blood glucose FOUR TIMES DAILY    predniSONE (DELTASONE) 50 MG tablet Take 1 tablet (50 mg) 13 hours prior to test, take 1 tablet (50 mg) 7 hours prior to test, take 1 tablet (50 mg) 1 hour prior to test    rosuvastatin (CRESTOR) 20 MG tablet TAKE ONE TABLET BY MOUTH EVERY EVENING    SYNTHROID 75 MCG tablet TAKE ONE TABLET BY MOUTH monday-saturday before breakfast    topiramate (TOPAMAX) 50 MG tablet Take 1 tablet (50 mg total) by mouth 2 (two) times daily.    Ubrogepant (UBRELVY) 50 MG TABS Take 50 mg by mouth as needed. May repeat x 1 tab after 2 hours; max 2 tabs per day or 8 per month 09/08/2021: 09/08/21 approved thru 09/08/2022   venlafaxine [facility])  37.5 MG tablet Take 37.5 mg once a day for a week then increase to 37.5 mg twice a day and continue that dose    No facility-administered encounter medications on file as of 11/16/2021.   BP Readings from Last 3 Encounters:  11/12/21 (!) 122/56  11/12/21 (!) 137/54  11/09/21 130/68    Lab Results  Component Value Date   HGBA1C 10.3 (A) 11/09/2021      Recent OV, Consult or Hospital visit:   Internal Medicine, Cardiology, ED and left AMA No medication changes indicated   Last adherence delivery date:10/27/21      Patient is due for next adherence delivery on: 11/25/21  Spoke with patient  on 11/16/21 reviewed medications and coordinated delivery.  This delivery to include: Vials  30 Days  no safety caps VIAL medications: Topiramate 44m- take 1 tablet 2 times daily . [name] - 1 tablet every evening  Synthroid 75 mcg - 1 tablet daily Monday-Saturday (before breakfast) TTyler AasFlextouch U-100 - Inject 46 units daily Rosuvastatin 253m take 1 tablet evening Losartan 25 mg- take 1 tablet daily  Carvedilol 3.12532mtake 1 tablet twice daily  [name] - Use as directed  Trueplus pen needle 32g4mm65mse as directed Glucagon 1mg 37mection - use as needed  Patient declined the following medications this month: Preservision Reds- patient has enough supply  Venlafaxine 37.5mg- 49mtient only taking as needed at this time Vitamin D- take 2000 units daily ( gets OTC) Ubrelvey 50mg- 57m 1 tablet at onset of HA as needed [name]-inject 5 units pre coffee in am  2 times week.  Any concerns about your medications? No  How often do you forget or accidentally miss a dose? Never  Do you use a pillbox? Yes  Is patient in packaging No   No refill request needed.   Patient requests new  Glucagon 1mg. In71mtion ( hers is expired)   Confirmed delivery date of 11/25/21, advised patient that pharmacy will contact them the morning of delivery.  Recent blood pressure readings are as follows:none available   Recent blood glucose readings are as follows:none available   Annual wellness visit in last year? Yes Most Recent BP reading:130/68  78-P  11/09/21  If Diabetic: Most recent A1C reading: 10.3  11/09/21 Last eye exam / retinopathy screening: 11/04/20 Last diabetic foot exam: 03/16/21  Lindsey Marjo Bickertified  Torie Priebe Avel [place]-[phone] spent for month CPA: 40 min

## 2021-11-17 ENCOUNTER — Ambulatory Visit (INDEPENDENT_AMBULATORY_CARE_PROVIDER_SITE_OTHER)
Admission: RE | Admit: 2021-11-17 | Discharge: 2021-11-17 | Disposition: A | Discharge status: Home / Self Care | Payer: HMO | Source: Ambulatory Visit | Attending: Family Medicine | Admitting: Family Medicine

## 2021-11-17 ENCOUNTER — Ambulatory Visit (INDEPENDENT_AMBULATORY_CARE_PROVIDER_SITE_OTHER): Payer: HMO | Admitting: Family Medicine

## 2021-11-17 ENCOUNTER — Other Ambulatory Visit: Payer: Self-pay

## 2021-11-17 ENCOUNTER — Encounter: Payer: Self-pay | Admitting: Family Medicine

## 2021-11-17 VITALS — BP 140/68 | HR 87 | Temp 97.5°F | Ht 63.75 in | Wt 154.2 lb

## 2021-11-17 DIAGNOSIS — M546 Pain in thoracic spine: Secondary | ICD-10-CM | POA: Diagnosis not present

## 2021-11-17 DIAGNOSIS — M545 Low back pain, unspecified: Secondary | ICD-10-CM

## 2021-11-17 DIAGNOSIS — M549 Dorsalgia, unspecified: Secondary | ICD-10-CM | POA: Diagnosis not present

## 2021-11-17 DIAGNOSIS — M25511 Pain in right shoulder: Secondary | ICD-10-CM

## 2021-11-17 DIAGNOSIS — M19011 Primary osteoarthritis, right shoulder: Secondary | ICD-10-CM | POA: Diagnosis not present

## 2021-11-17 DIAGNOSIS — W19XXXD Unspecified fall, subsequent encounter: Secondary | ICD-10-CM

## 2021-11-17 MED ORDER — MELOXICAM 7.5 MG PO TABS: 7.5000 mg | ORAL_TABLET | Freq: Every day | ORAL | 0 refills | Status: DC

## 2021-11-17 NOTE — Patient Instructions (Addendum)
Pain - meloxicam 7.5 mg - can take daily for up to 2 weeks - Tylenol 1000 mg up to 3 times a day  X-Ray today  Monitor for easy bruising or bleeding on Meloxicam

## 2021-11-17 NOTE — Progress Notes (Signed)
Subjective:     Cassandra Benson is a 81 y.o. female presenting for Fall (Pt fell 11/12/21 while at hospital.  C/o center back pain. )     HPI  #Fall - on 11/12/2021 - went to get her coronary calcium score - went to the bathroom at the facility - went to walk away after washing her hands - tripped over a step stool which was sticking out from under the sink - this was at [facility] - she was trying to catch herself and stumbled a few feet before falling - landed on her side and hit her head on the floor - landed on the right - Left ankle - swelling is improving, scrap is healing, is walking OK - Right hip - still having significant pain in the right hip - now with pain down the middle of her back - all of her back hurts - arthritis of the spine - Has been taking liquid tylenol w/o improvement  Did have blurred vision for 3 days Is noticing some balance issues since the fall   Review of Systems  ER on 11/12/2021: Left AMA -  Left ankle - no fracture, edema Right hip - no fracture, replacement w/o issue CT Head - no bleed   Social History   Tobacco Use  Smoking Status Never  Smokeless Tobacco Never        Objective:    BP Readings from Last 3 Encounters:  11/17/21 140/68  11/12/21 (!) 122/56  11/12/21 (!) 137/54   Wt Readings from Last 3 Encounters:  11/17/21 154 lb 4 oz (70 kg)  11/12/21 154 lb 5.2 oz (70 kg)  11/09/21 153 lb 12.8 oz (69.8 kg)    BP 140/68    Pulse 87    Temp (!) 97.5 F (36.4 C) (Temporal)    Ht 5' 3.75" (1.619 m)    Wt 154 lb 4 oz (70 kg)    SpO2 99%    BMI 26.69 kg/m    Physical Exam Constitutional:      General: She is not in acute distress.    Appearance: She is well-developed. She is not diaphoretic.  HENT:     Right Ear: External ear normal.     Left Ear: External ear normal.  Eyes:     Conjunctiva/sclera: Conjunctivae normal.  Cardiovascular:     Rate and Rhythm: Normal rate.  Pulmonary:     Effort:  Pulmonary effort is normal.  Musculoskeletal:     Cervical back: Neck supple.     Comments: TTP with light touch along the Thoracic and lumbar spine. Also ttp along the anterior and posterior and lateral shoulder.  Back ROM normal - though with pain Shoulder ROM normal - though endorses pain  Skin:    General: Skin is warm and dry.     Capillary Refill: Capillary refill takes less than 2 seconds.  Neurological:     Mental Status: She is alert. Mental status is at baseline.  Psychiatric:        Mood and Affect: Mood normal.        Behavior: Behavior normal.     Right shoulder: AC arthritic changes, no clear fracture on my read XR Lumbar spine: no fracture on my read Thoracic spine: no fracture on my read     Assessment & Plan:   Problem List Items Addressed This Visit   None Visit Diagnoses     Fall, subsequent encounter    -  Primary   Relevant Orders   DG Shoulder Right   DG Lumbar Spine Complete   DG Thoracic Spine W/Swimmers   Acute pain of right shoulder       Relevant Orders   DG Shoulder Right   Acute midline low back pain without sciatica       Relevant Medications   meloxicam (MOBIC) 7.5 MG tablet   Other Relevant Orders   DG Lumbar Spine Complete   Upper back pain       Relevant Medications   meloxicam (MOBIC) 7.5 MG tablet   Other Relevant Orders   DG Thoracic Spine W/Swimmers      Reassuring x-rays both in the ER and here in the clinic without sign of fracture.  Follow-up final read on the x-rays from today.  Discussed with patient that her kidney function is within the normal range and reasonable for short course of NSAIDs.  She is on Plavix discussed bleeding risk and monitoring for bruising.  Also advised continuing Tylenol.  Advised calling back if her pain does not improve over the next few days.  She also noted some instability since the fall she will call back if this is not improving over the next week for PT referral.   Return in about 2 weeks  (around 12/01/2021), or if symptoms worsen or fail to improve.  Lesleigh Noe, MD  This visit occurred during the SARS-CoV-2 public health emergency.  Safety protocols were in place, including screening questions prior to the visit, additional usage of staff PPE, and extensive cleaning of exam room while observing appropriate contact time as indicated for disinfecting solutions.

## 2021-11-18 ENCOUNTER — Other Ambulatory Visit: Payer: Self-pay | Admitting: Internal Medicine

## 2021-11-18 DIAGNOSIS — E139 Other specified diabetes mellitus without complications: Secondary | ICD-10-CM

## 2021-11-19 ENCOUNTER — Ambulatory Visit: Payer: HMO | Admitting: Family Medicine

## 2021-11-22 NOTE — Telephone Encounter (Signed)
Noted  

## 2021-11-23 ENCOUNTER — Other Ambulatory Visit: Payer: HMO

## 2021-11-25 ENCOUNTER — Ambulatory Visit: Payer: HMO | Admitting: Dietician

## 2021-11-27 NOTE — Telephone Encounter (Signed)
Seen by Dr Einar Pheasant with reassuring xrays.  ?

## 2021-11-30 ENCOUNTER — Ambulatory Visit: Payer: HMO | Admitting: Dietician

## 2021-12-02 ENCOUNTER — Encounter: Payer: Self-pay | Admitting: Family Medicine

## 2021-12-13 ENCOUNTER — Telehealth: Payer: Self-pay

## 2021-12-13 ENCOUNTER — Other Ambulatory Visit: Payer: Self-pay

## 2021-12-13 ENCOUNTER — Encounter: Payer: Self-pay | Admitting: Family Medicine

## 2021-12-13 ENCOUNTER — Ambulatory Visit (INDEPENDENT_AMBULATORY_CARE_PROVIDER_SITE_OTHER): Payer: HMO | Admitting: Family Medicine

## 2021-12-13 ENCOUNTER — Ambulatory Visit: Payer: HMO | Admitting: Family Medicine

## 2021-12-13 VITALS — BP 140/66 | HR 95 | Temp 97.8°F | Ht 63.75 in | Wt 156.2 lb

## 2021-12-13 DIAGNOSIS — M79661 Pain in right lower leg: Secondary | ICD-10-CM | POA: Diagnosis not present

## 2021-12-13 DIAGNOSIS — R0609 Other forms of dyspnea: Secondary | ICD-10-CM | POA: Diagnosis not present

## 2021-12-13 DIAGNOSIS — M7989 Other specified soft tissue disorders: Secondary | ICD-10-CM | POA: Diagnosis not present

## 2021-12-13 DIAGNOSIS — R079 Chest pain, unspecified: Secondary | ICD-10-CM

## 2021-12-13 NOTE — Assessment & Plan Note (Signed)
New, worsening, with R leg swelling will need to r/o PE - check D dimer and if positive proceed with CTA chest. Red flags to seek ER care reviewed. Pt agrees with plan.  ?

## 2021-12-13 NOTE — Addendum Note (Signed)
Addended by: [name] on: 12/13/2021 05:08 PM ? ? Modules accepted: Orders ? ?

## 2021-12-13 NOTE — Progress Notes (Addendum)
? ? Patient ID: Cassandra Benson, female    DOB: Nov 04, 1940, 81 y.o.   MRN: [id number] ? ?This visit was conducted in person. ? ?BP 140/66   Pulse 95   Temp 97.8 ?F (36.6 ?C) (Temporal)   Ht 5' 3.75" (1.619 m)   Wt 156 lb 4 oz (70.9 kg)   SpO2 100%   BMI 27.03 kg/m?   ? ?CC: R leg pain/swelling  ?Subjective:  ? ?HPI: ?Cassandra Benson is a 81 y.o. female presenting on 12/13/2021 for Leg Pain (C/o R leg pain/swelling. Started about 5 days ago. Has been wearing compression stockings, helpful. ) ? ? ?Recent cardiac CTA - no significant disease noted, calcium score of zero, normal coronary arteries.  ? ?Suffered fall 11/12/2021 while at [facility] facility - tripped over stool in the bathroom. Went to ER, left without being evaluated due to prolonged wait.  ?[employer] insulin pump company sent her notice that her device may have been malfunctioning for a long time.  ? ?Notes ongoing fatigue "zero energy and no stamina".  ?Notes ongoing easy exertional dyspnea - over last several months, acutely worse since fall at [facility].  ?Notes ongoing chest discomfort, reproducible with palpation of costochondral junction.  ?Notes R leg calf swelling over the past 1-2 weeks. Compression stockings have helped this.  ?Staying tender to R lower ribcage.  ? ?Poorly healing scab to left anterior lower leg suffered after fall.  ? ?No prolonged car or plane rides. Not on hormonal treatment. ?No personal history of blood clots. +fmhx (both parents) ?She had COVID infection 07/2021.  ? ?Requests [place] handicap placard renewal - has difficulty with walking past a block due to shortness of breath.  ?   ? ?Relevant past medical, surgical, family and social history reviewed and updated as indicated. Interim medical history since our last visit reviewed. ?Allergies and medications reviewed and updated. ?Outpatient Medications Prior to Visit  ?Medication Sig Dispense Refill  ? acetaminophen (TYLENOL) 500 MG tablet Take 1 tablet (500 mg total) by  mouth 3 (three) times daily as needed.    ? Ascorbic Acid (VITAMIN C) 100 MG tablet Take 100 mg by mouth daily.    ? BD ULTRA-FINE LANCETS lancets Use as instructed upto 6 times daily 600 each 2  ? benzonatate (TESSALON) 100 MG capsule Take 1 capsule (100 mg total) by mouth 3 (three) times daily as needed for cough. 30 capsule 3  ? carvedilol (COREG) 3.125 MG tablet Take 1 tablet (3.125 mg total) by mouth 2 (two) times daily with a meal. 60 tablet 6  ? Cholecalciferol (VITAMIN D3) 25 MCG (1000 UT) CAPS Take 1 capsule (1,000 Units total) by mouth daily. 30 capsule   ? clopidogrel (PLAVIX) 75 MG tablet TAKE ONE TABLET BY MOUTH EVERY EVENING 30 tablet 6  ? COMFORT EZ PEN NEEDLES 32G X 4 MM MISC Use 4-5x a day 300 each 3  ? Continuous Blood Gluc Transmit (DEXCOM G6 TRANSMITTER) MISC 1 kit by Does not apply route See admin instructions. Use to check blood sugar 1 each 1  ? diclofenac Sodium (VOLTAREN) 1 % GEL Apply 2 g topically 3 (three) times daily. 100 g 3  ? Glucagon, rDNA, (GLUCAGON EMERGENCY) 1 MG KIT INJECT into THE muscle ONCE AS NEEDED FOR emergency 1 kit 12  ? Insulin Aspart FlexPen (NOVOLOG) 100 UNIT/ML USE UP TO 50 UNITS DAILY in insulin pump (Patient taking differently: 10 - 12 units with each meal) 50 mL 0  ? insulin degludec (  TRESIBA FLEXTOUCH) 100 UNIT/ML FlexTouch Pen Inject 46 Units into the skin daily. 36 mL 0  ? losartan (COZAAR) 25 MG tablet TAKE ONE TABLET BY MOUTH ONCE DAILY 90 tablet 0  ? meloxicam (MOBIC) 7.5 MG tablet Take 1 tablet (7.5 mg total) by mouth daily. 14 tablet 0  ? metoprolol tartrate (LOPRESSOR) 100 MG tablet Take 1 tablet (100 mg) by mouth 2 hours prior to your Cardiac CT 1 tablet 0  ? nitroGLYCERIN (NITROSTAT) 0.4 MG SL tablet Place 1 tablet (0.4 mg total) under the tongue every 5 (five) minutes as needed for chest pain. 25 tablet 1  ? ONETOUCH ULTRA test strip USE TO check blood glucose FOUR TIMES DAILY 400 strip 2  ? predniSONE (DELTASONE) 50 MG tablet Take 1 tablet (50 mg) 13  hours prior to test, take 1 tablet (50 mg) 7 hours prior to test, take 1 tablet (50 mg) 1 hour prior to test 3 tablet 0  ? rosuvastatin (CRESTOR) 20 MG tablet TAKE ONE TABLET BY MOUTH EVERY EVENING 90 tablet 0  ? SYNTHROID 75 MCG tablet TAKE ONE TABLET BY MOUTH monday-saturday before breakfast 80 tablet 1  ? topiramate (TOPAMAX) 50 MG tablet Take 1 tablet (50 mg total) by mouth 2 (two) times daily. 60 tablet 12  ? Ubrogepant (UBRELVY) 50 MG TABS Take 50 mg by mouth as needed. May repeat x 1 tab after 2 hours; max 2 tabs per day or 8 per month 8 tablet 6  ? venlafaxine (EFFEXOR) 37.5 MG tablet Take 37.5 mg once a day for a week then increase to 37.5 mg twice a day and continue that dose    ? ?No facility-administered medications prior to visit.  ?  ? ?Per HPI unless specifically indicated in ROS section below ?Review of Systems ? ?Objective:  ?BP 140/66   Pulse 95   Temp 97.8 ?F (36.6 ?C) (Temporal)   Ht 5' 3.75" (1.619 m)   Wt 156 lb 4 oz (70.9 kg)   SpO2 100%   BMI 27.03 kg/m?   ?Wt Readings from Last 3 Encounters:  ?12/13/21 156 lb 4 oz (70.9 kg)  ?11/17/21 154 lb 4 oz (70 kg)  ?11/12/21 154 lb 5.2 oz (70 kg)  ?  ?  ?Physical Exam ?Vitals and nursing note reviewed.  ?Constitutional:   ?   Appearance: Normal appearance. She is not ill-appearing.  ?Cardiovascular:  ?   Rate and Rhythm: Normal rate and regular rhythm.  ?   Pulses: Normal pulses.  ?   Heart sounds: Normal heart sounds. No murmur heard. ?Pulmonary:  ?   Effort: Pulmonary effort is normal. No respiratory distress.  ?   Breath sounds: Normal breath sounds. No wheezing, rhonchi or rales.  ?Musculoskeletal:     ?   General: Swelling (at R calf into popliteal area, no palpable cords) and tenderness present.  ?   Comments:  ?L calf circ 30cm ?R calf circ 32.5cm  ?Skin: ?   General: Skin is warm and dry.  ?   Findings: No rash.  ?   Comments: Small scab to left lower anterior leg without surrounding erythema or drainage  ?Neurological:  ?   Mental  Status: She is alert.  ?Psychiatric:     ?   Mood and Affect: Mood normal.     ?   Behavior: Behavior normal.  ? ?   ?Results for orders placed or performed during the hospital encounter of 11/12/21  ?CBG monitoring, ED  ?Result Value  Ref Range  ? Glucose-Capillary 195 (H) 70 - 99 mg/dL  ? Comment 1 Notify RN   ? Comment 2 Document in Chart   ? ? ?Assessment & Plan:  ?This visit occurred during the SARS-CoV-2 public health emergency.  Safety protocols were in place, including screening questions prior to the visit, additional usage of staff PPE, and extensive cleaning of exam room while observing appropriate contact time as indicated for disinfecting solutions.  ? ?Handicap placard application filled out for patient.  ?Problem List Items Addressed This Visit   ? ? Chest pain  ?  MSK chest wall pain, but also with ongoing deeper chest discomfort with recent reassuring cardiac testing. See below.  ?  ?  ? Pain and swelling of lower leg - Primary  ?  New R calf and popliteal swelling over the past 1+ week with evident increase in calf circumference. Recent COVID infection could have made her more hypercoagulable in the interim - check D dimer and leg [place]. Pt agrees with plan.  ?  ?  ? Relevant Orders  ? Basic metabolic panel  ? CBC with Differential/Platelet  ? D-dimer, quantitative  ? US Venous Img Lower Unilateral Right (DVT)  ? Dyspnea on exertion  ?  New, worsening, with R leg swelling will need to r/o PE - check D dimer and if positive proceed with CTA chest. Red flags to seek ER care reviewed. Pt agrees with plan.  ?  ?  ? Relevant Orders  ? Basic metabolic panel  ? CBC with Differential/Platelet  ? D-dimer, quantitative  ?  ? ?No orders of the defined types were placed in this encounter. ? ?Orders Placed This Encounter  ?Procedures  ? US Venous Img Lower Unilateral Right (DVT)  ?  Standing Status:   Future  ?  Standing Expiration Date:   12/14/2022  ?  Scheduling Instructions:  ?   Would like done tomorrow at  [facility]  ?  Order Specific Question:   Reason for Exam (SYMPTOM  OR DIAGNOSIS REQUIRED)  ?  Answer:   R leg swelling x 1+ wk at calf and popliteal area  ?  Order Specific Question:   Preferred imaging location?  ?  Answer

## 2021-12-13 NOTE — Assessment & Plan Note (Signed)
MSK chest wall pain, but also with ongoing deeper chest discomfort with recent reassuring cardiac testing. See below.  ?

## 2021-12-13 NOTE — Patient Instructions (Addendum)
Labs today.  ?Walking oxygen test today.  ?I will order R leg ultrasound and depending on results, we may check a contrasted chest CT.  ?Keep legs elevated.  ?If any sudden worsening chest pain or shortness of breath, go back to the ER for urgent evaluation.  ?

## 2021-12-13 NOTE — Assessment & Plan Note (Signed)
New R calf and popliteal swelling over the past 1+ week with evident increase in calf circumference. Recent COVID infection could have made her more hypercoagulable in the interim - check D dimer and leg [place]. Pt agrees with plan.  ?

## 2021-12-13 NOTE — Telephone Encounter (Signed)
Dr. Darnell Level wants to see pt before 12/17/21 for right leg pain/swelling. ? ?Spoke with pt offering today at 4:30 PM.  Pt agrees to appt and understands to be here by 4:15. ? ?Plz r/s pt's 12/17/21 OV to today at 4:30.  ?

## 2021-12-14 ENCOUNTER — Telehealth: Payer: Self-pay | Admitting: Family Medicine

## 2021-12-14 ENCOUNTER — Other Ambulatory Visit: Payer: Self-pay | Admitting: Internal Medicine

## 2021-12-14 ENCOUNTER — Ambulatory Visit
Admission: RE | Admit: 2021-12-14 | Discharge: 2021-12-14 | Disposition: A | Discharge status: Home / Self Care | Payer: HMO | Source: Ambulatory Visit | Attending: Family Medicine | Admitting: Family Medicine

## 2021-12-14 ENCOUNTER — Telehealth: Payer: Self-pay

## 2021-12-14 DIAGNOSIS — R0789 Other chest pain: Secondary | ICD-10-CM

## 2021-12-14 DIAGNOSIS — E1169 Type 2 diabetes mellitus with other specified complication: Secondary | ICD-10-CM

## 2021-12-14 DIAGNOSIS — M7989 Other specified soft tissue disorders: Secondary | ICD-10-CM | POA: Diagnosis not present

## 2021-12-14 DIAGNOSIS — M79661 Pain in right lower leg: Secondary | ICD-10-CM

## 2021-12-14 LAB — CBC WITH DIFFERENTIAL/PLATELET
Basophils Absolute: 0.1 10*3/uL (ref 0.0–0.1)
Basophils Relative: 1.1 % (ref 0.0–3.0)
Eosinophils Absolute: 0.1 10*3/uL (ref 0.0–0.7)
Eosinophils Relative: 1.5 % (ref 0.0–5.0)
HCT: 41.3 % (ref 36.0–46.0)
Hemoglobin: 13.6 g/dL (ref 12.0–15.0)
Lymphocytes Relative: 23.3 % (ref 12.0–46.0)
Lymphs Abs: 2.2 10*3/uL (ref 0.7–4.0)
MCHC: 32.9 g/dL (ref 30.0–36.0)
MCV: 81.9 fl (ref 78.0–100.0)
Monocytes Absolute: 0.6 10*3/uL (ref 0.1–1.0)
Monocytes Relative: 6.1 % (ref 3.0–12.0)
Neutro Abs: 6.3 10*3/uL (ref 1.4–7.7)
Neutrophils Relative %: 68 % (ref 43.0–77.0)
Platelets: 199 10*3/uL (ref 150.0–400.0)
RBC: 5.05 Mil/uL (ref 3.87–5.11)
RDW: 14.8 % (ref 11.5–15.5)
WBC: 9.3 10*3/uL (ref 4.0–10.5)

## 2021-12-14 LAB — BASIC METABOLIC PANEL
BUN: 12 mg/dL (ref 6–23)
CO2: 27 mEq/L (ref 19–32)
Calcium: 9.7 mg/dL (ref 8.4–10.5)
Chloride: 106 mEq/L (ref 96–112)
Creatinine, Ser: 0.99 mg/dL (ref 0.40–1.20)
GFR: 54.25 mL/min — ABNORMAL LOW (ref >60.00)
Glucose, Bld: 197 mg/dL — ABNORMAL HIGH (ref 70–99)
Potassium: 4.6 mEq/L (ref 3.5–5.1)
Sodium: 140 mEq/L (ref 135–145)

## 2021-12-14 LAB — D-DIMER, QUANTITATIVE: D-Dimer, Quant: 0.67 mcg/mL FEU — ABNORMAL HIGH (ref <0.50)

## 2021-12-14 MED ORDER — PREDNISONE & DIPHENHYDRAMINE 3 X 50 MG & 1 X 50 MG PO KIT: PACK | ORAL | 0 refills | Status: DC

## 2021-12-14 NOTE — Telephone Encounter (Signed)
Pending leg [place] today.  ?Will add CTA chest given associated shortness of breath.  ?

## 2021-12-14 NOTE — Telephone Encounter (Signed)
Verdene Lennert, this should have gone to Dr Danise Mina or his [place].  ? ?Dr Darnell Level please advise. Thanks! ? ?

## 2021-12-14 NOTE — Telephone Encounter (Signed)
Spoke with pt notifying her rx was sent in.  Pt states she just picked med and expresses her thanks.  ?

## 2021-12-14 NOTE — Telephone Encounter (Signed)
Pt aware of D-Dimer results and that CTA has been ordered.  ?Unable to do CTA today due to having severe allergy to contrast - must be pre-treated prior to contrast. ? ?Pt is scheduled 12/15/21 at 3:30 at [place] 4 hrs prior  ? ?Needs 13hr Prednisone pre-treatment sent to pharmacy. Dr Darnell Level is aware that this needs to be sent to pharmacy.  ? ?Pt is okay with this plan and will go today to [place] to have her US Venous LE (DVT) done.  ? ? ?Pt states that her leg is very sore today - very tender to touch.  ? ? ? ?Pharmacy for Prednisone -  ?[place] ?[address]  ?[place] [address]  ? ?

## 2021-12-14 NOTE — Telephone Encounter (Signed)
Pt scheduled for [place] today at [place] ? ?CTA is scheduled for 12/15/21 at [place] is aware. ? ?Pt is aware of both appts.  ? ?See TE 12/14/21 ?

## 2021-12-14 NOTE — Telephone Encounter (Signed)
[facility] - Client ?TELEPHONE ADVICE RECORD ?AccessNurse? ?Patient ?Name: ?Lovell ?Gender: Female ?DOB: [birth year] ?Age: 81 Y 29 M 19 D ?Return ?Phone ?Number: ?[id number] ?(Primary), ?[id number] ?(Secondary) ?Address: ?City/ ?State/ ?Zip: [place]  ? [address] ?Client [facility] Client ?Client Site [place] ?Provider [name] - MD ?Contact Type Call ?Who Is Calling Lab / Radiology ?Lab Name [place] ?Lab Phone Number [phone] ?Lab Tech Name Mekiah ?Lab Reference Number [id number] ?Chief Complaint Lab Result (Critical or Stat) ?Call Type Lab Send to RN ?Reason for Call Report lab results ?Initial Comment Caller with urgent lab results ?Translation No ?Nurse Assessment ?Nurse: Ysidro Evert, RN, Levada Dy Date/Time Eilene Ghazi Time): 12/14/2021 7:49:50 AM ?Is there an on-call provider listed? ---Yes ?Please document the following items: Lab name Lab ?value (read back to lab to verify) Reference range ?for lab value Date and time blood was drawn Collect ?time of birth for bilirubin results ?---D-DIMER 0.67 collected 12/13/21 @ 1646 ordered ?by Dr Danise Mina ?Disp. Time (Eastern ?Time) Disposition Final User ?12/14/2021 7:53:53 AM Send To RN Personal Ysidro Evert, RN, Levada Dy ?12/14/2021 8:14:35 AM Clinical Call Yes Ysidro Evert, RN, Levada Dy ?Comments ?User: Herbert Pun, RN Date/Time Eilene Ghazi Time): 12/14/2021 8:13:25 AM ?Reported critical value to Clifton Custard. Requested to report to an RN in the office and was advised that the lab ?takes critical value report ?

## 2021-12-14 NOTE — Telephone Encounter (Signed)
I've sent this in for patient.  ?

## 2021-12-14 NOTE — Telephone Encounter (Signed)
See note below the access nurse note; will send to Dr Danise Mina and lisa CMA and will teams lisa. ?

## 2021-12-14 NOTE — Telephone Encounter (Signed)
On call phone service called a critical D Dimer. Result is 0.67. Results given to Dr Danise Mina. ?

## 2021-12-14 NOTE — Telephone Encounter (Signed)
[facility] pharmacy called stating that pt was there to pick up her prescription of prednisone that they haven't received. Please advise. ?

## 2021-12-15 ENCOUNTER — Telehealth: Payer: Self-pay

## 2021-12-15 ENCOUNTER — Ambulatory Visit (HOSPITAL_COMMUNITY): Payer: HMO | Attending: Family Medicine

## 2021-12-15 ENCOUNTER — Encounter: Payer: Self-pay | Admitting: Family Medicine

## 2021-12-15 NOTE — Chronic Care Management (AMB) (Signed)
? ? ?Chronic Care Management ?Pharmacy Assistant  ? ?Name: Cassandra Benson  MRN: [id number] DOB: [birth year] ? ?Reason for Encounter: Medication Adherence and Delivery Coordination ?  ? ?Recent office visits:  ?12/13/21-PCP-Javier Gutierrez,MD-right eg pain and swelling X 5 days-check D dimer and leg [place].(Your kidneys were stable, blood counts were normal) awaiting imaging ?11/17/21-[facility] for follow up hospital fall.reassuring xrays, possible referral for PT. For pain try meloxicam 7.50m take 1 tablet daily  ? ?Recent consult visits:  ?12/16/21-[facility]-Dylan Smith,MD- presented for right leg swelling and shortness of breath.Labs ordered EKG,Xrays-Started lasix 439m-discharged with Lasix 2076make 2 tablet daily. ? ?Hospital visits:  ?None in previous 6 months ? ?Medications: ?Outpatient Encounter Medications as of 12/15/2021  ?Medication Sig Note  ? acetaminophen (TYLENOL) 500 MG tablet Take 1 tablet (500 mg total) by mouth 3 (three) times daily as needed.   ? Ascorbic Acid (VITAMIN C) 100 MG tablet Take 100 mg by mouth daily.   ? BD ULTRA-FINE LANCETS lancets Use as instructed upto 6 times daily   ? benzonatate (TESSALON) 100 MG capsule Take 1 capsule (100 mg total) by mouth 3 (three) times daily as needed for cough.   ? carvedilol (COREG) 3.125 MG tablet Take 1 tablet (3.125 mg total) by mouth 2 (two) times daily with a meal.   ? Cholecalciferol (VITAMIN D3) 25 MCG (1000 UT) CAPS Take 1 capsule (1,000 Units total) by mouth daily.   ? clopidogrel (PLAVIX) 75 MG tablet TAKE ONE TABLET BY MOUTH EVERY EVENING   ? COMFORT EZ PEN NEEDLES 32G X 4 MM MISC Use 4-5x a day   ? Continuous Blood Gluc Transmit (DEXCOM G6 TRANSMITTER) MISC 1 kit by Does not apply route See admin instructions. Use to check blood sugar   ? diclofenac Sodium (VOLTAREN) 1 % GEL Apply 2 g topically 3 (three) times daily.   ? Glucagon, rDNA, (GLUCAGON EMERGENCY) 1 MG KIT INJECT into THE muscle ONCE AS NEEDED FOR  emergency   ? Insulin Aspart FlexPen (NOVOLOG) 100 UNIT/ML USE UP TO 50 UNITS DAILY in insulin pump (Patient taking differently: 10 - 12 units with each meal)   ? losartan (COZAAR) 25 MG tablet TAKE ONE TABLET BY MOUTH ONCE DAILY   ? meloxicam (MOBIC) 7.5 MG tablet Take 1 tablet (7.5 mg total) by mouth daily.   ? metoprolol tartrate (LOPRESSOR) 100 MG tablet Take 1 tablet (100 mg) by mouth 2 hours prior to your Cardiac CT   ? nitroGLYCERIN (NITROSTAT) 0.4 MG SL tablet Place 1 tablet (0.4 mg total) under the tongue every 5 (five) minutes as needed for chest pain.   ? ONETOUCH ULTRA test strip USE TO check blood glucose FOUR TIMES DAILY   ? predniSONE & diphenhydrAMINE 3 x 50 MG & 1 x 50 MG KIT Take by mouth as directed   ? predniSONE (DELTASONE) 50 MG tablet Take 1 tablet (50 mg) 13 hours prior to test, take 1 tablet (50 mg) 7 hours prior to test, take 1 tablet (50 mg) 1 hour prior to test   ? rosuvastatin (CRESTOR) 20 MG tablet TAKE ONE TABLET BY MOUTH EVERY EVENING   ? SYNTHROID 75 MCG tablet TAKE ONE TABLET BY MOUTH monday-saturday before breakfast   ? topiramate (TOPAMAX) 50 MG tablet Take 1 tablet (50 mg total) by mouth 2 (two) times daily.   ? TRESIBA FLEXTOUCH 100 UNIT/ML FlexTouch Pen Inject 46 Units into the skin daily.   ? Ubrogepant (UBRELVY) 50 MG TABS Take  50 mg by mouth as needed. May repeat x 1 tab after 2 hours; max 2 tabs per day or 8 per month 09/08/2021: 09/08/21 approved thru 09/08/2022  ? venlafaxine (EFFEXOR) 37.5 MG tablet Take 37.5 mg once a day for a week then increase to 37.5 mg twice a day and continue that dose   ? ?No facility-administered encounter medications on file as of 12/15/2021.  ? ?BP Readings from Last 3 Encounters:  ?12/13/21 140/66  ?11/17/21 140/68  ?11/12/21 (!) 122/56  ?  ?Lab Results  ?Component Value Date  ? HGBA1C 10.3 (A) 11/09/2021  ?  ? ? ?Recent OV, Consult or Hospital visit:  Family Medicine  start meloxicam   [facility] ED- start lasix 68m  ?Recent medication changes  indicated:  ? ? ?Last adherence delivery date:11/25/21     ? ?Patient is due for next adherence delivery on: 12/27/21 ? ?Spoke with patient on 12/17/21 reviewed medications and coordinated delivery. ? ?This delivery to include: Vials  30 Days  No safety caps  ? ?Topiramate 568m take 1 tablet 2 times daily . ?[name] - 1 tablet every evening  ?Synthroid 75 mcg - 1 tablet daily Monday-Saturday (before breakfast) ?Rosuvastatin 2035mtake 1 tablet evening ?Losartan 25 mg- take 1 tablet daily  ?Carvedilol 3.125m72make 1 tablet twice daily  ?TresTyler Aasxtouch U-100 - Inject 46 units daily ?[name] - Use as directed  ?Trueplus pen needle 32g4mm-36me as directed ?Glucagon 1mg i39mction - use as needed ? ? ?Patient declined the following medications this month: ?PreserExeterent has enough supply  ?Venlafaxine 37.5mg-  80mient only taking as needed at this time ?Vitamin D- take 2000 units daily ( gets OTC) ?Ubrelvey 50mg- t52m1 tablet at onset of HA as needed ?[name]- inject 5 units pre meals  ? ? ?Any concerns about your medications?  She will just be starting lasix and may have to make adjustments, so does not want added to packaging yet. ? ?How often do you forget or accidentally miss a dose? Never ? ?Do you use a pillbox? Yes ? ? ?Refills requested from providers include:Tresiba, ? ?Confirmed delivery date of 12/27/21, advised patient that pharmacy will contact them the morning of delivery. ? ?Recent blood pressure readings are as follows:none available  ? ? ?Recent blood glucose readings are as follows: ?Fasting:  stays in 200's ? ? ? ?Annual wellness visit in last year? Yes ?Most Recent BP reading:140/66  95-P ? ?If Diabetic: ?Most recent A1C reading:10.3  11/09/21 ?Last eye exam / retinopathy screening:10/2020 ?Last diabetic foot exam:UTD ? ?[name] ? ?Tyera Hansley, CCMA ?Health concierge  ?[phone]

## 2021-12-16 ENCOUNTER — Emergency Department: Payer: HMO

## 2021-12-16 ENCOUNTER — Emergency Department
Admission: EM | Admit: 2021-12-16 | Discharge: 2021-12-16 | Disposition: A | Discharge status: Home / Self Care | Payer: HMO | Attending: Emergency Medicine | Admitting: Emergency Medicine

## 2021-12-16 ENCOUNTER — Encounter: Payer: Self-pay | Admitting: Family Medicine

## 2021-12-16 ENCOUNTER — Other Ambulatory Visit: Payer: Self-pay

## 2021-12-16 DIAGNOSIS — D72829 Elevated white blood cell count, unspecified: Secondary | ICD-10-CM | POA: Diagnosis not present

## 2021-12-16 DIAGNOSIS — M79604 Pain in right leg: Secondary | ICD-10-CM | POA: Diagnosis not present

## 2021-12-16 DIAGNOSIS — R600 Localized edema: Secondary | ICD-10-CM | POA: Insufficient documentation

## 2021-12-16 DIAGNOSIS — R0602 Shortness of breath: Secondary | ICD-10-CM | POA: Diagnosis not present

## 2021-12-16 DIAGNOSIS — I110 Hypertensive heart disease with heart failure: Secondary | ICD-10-CM | POA: Diagnosis not present

## 2021-12-16 DIAGNOSIS — Z131 Encounter for screening for diabetes mellitus: Secondary | ICD-10-CM | POA: Insufficient documentation

## 2021-12-16 DIAGNOSIS — I5033 Acute on chronic diastolic (congestive) heart failure: Secondary | ICD-10-CM | POA: Diagnosis not present

## 2021-12-16 DIAGNOSIS — M7989 Other specified soft tissue disorders: Secondary | ICD-10-CM | POA: Insufficient documentation

## 2021-12-16 LAB — CBC WITH DIFFERENTIAL/PLATELET
Abs Immature Granulocytes: 0.05 10*3/uL (ref 0.00–0.07)
Basophils Absolute: 0.1 10*3/uL (ref 0.0–0.1)
Basophils Relative: 0 %
Eosinophils Absolute: 0 10*3/uL (ref 0.0–0.5)
Eosinophils Relative: 0 %
HCT: 42.7 % (ref 36.0–46.0)
Hemoglobin: 13.8 g/dL (ref 12.0–15.0)
Immature Granulocytes: 0 %
Lymphocytes Relative: 24 %
Lymphs Abs: 3.2 10*3/uL (ref 0.7–4.0)
MCH: 26.6 pg (ref 26.0–34.0)
MCHC: 32.3 g/dL (ref 30.0–36.0)
MCV: 82.3 fL (ref 80.0–100.0)
Monocytes Absolute: 0.7 10*3/uL (ref 0.1–1.0)
Monocytes Relative: 5 %
Neutro Abs: 9.3 10*3/uL — ABNORMAL HIGH (ref 1.7–7.7)
Neutrophils Relative %: 71 %
Platelets: 238 10*3/uL (ref 150–400)
RBC: 5.19 MIL/uL — ABNORMAL HIGH (ref 3.87–5.11)
RDW: 14 % (ref 11.5–15.5)
WBC: 13.4 10*3/uL — ABNORMAL HIGH (ref 4.0–10.5)
nRBC: 0 % (ref 0.0–0.2)

## 2021-12-16 LAB — COMPREHENSIVE METABOLIC PANEL
ALT: 19 U/L (ref 0–44)
AST: 19 U/L (ref 15–41)
Albumin: 3.8 g/dL (ref 3.5–5.0)
Alkaline Phosphatase: 77 U/L (ref 38–126)
Anion gap: 11 (ref 5–15)
BUN: 18 mg/dL (ref 8–23)
CO2: 24 mmol/L (ref 22–32)
Calcium: 9.7 mg/dL (ref 8.9–10.3)
Chloride: 105 mmol/L (ref 98–111)
Creatinine, Ser: 0.81 mg/dL (ref 0.44–1.00)
GFR, Estimated: >60 mL/min (ref >60)
Glucose, Bld: 180 mg/dL — ABNORMAL HIGH (ref 70–99)
Potassium: 3.6 mmol/L (ref 3.5–5.1)
Sodium: 140 mmol/L (ref 135–145)
Total Bilirubin: 0.6 mg/dL (ref 0.3–1.2)
Total Protein: 7.1 g/dL (ref 6.5–8.1)

## 2021-12-16 LAB — BRAIN NATRIURETIC PEPTIDE: B Natriuretic Peptide: 86.9 pg/mL (ref 0.0–100.0)

## 2021-12-16 LAB — CBG MONITORING, ED: Glucose-Capillary: 123 mg/dL — ABNORMAL HIGH (ref 70–99)

## 2021-12-16 MED ORDER — FUROSEMIDE 20 MG PO TABS: 20.0000 mg | ORAL_TABLET | Freq: Every day | ORAL | 1 refills | Status: DC

## 2021-12-16 MED ORDER — FUROSEMIDE 40 MG PO TABS
40.0000 mg | ORAL_TABLET | Freq: Once | ORAL | Status: AC
  Administered 2021-12-16: 40 mg via ORAL
  Filled 2021-12-16: qty 1

## 2021-12-16 NOTE — ED Triage Notes (Signed)
Patient to Er via POV with complaints of right lower leg swelling that started three weeks ago. States today she is also having pain behind her knee. Patient also reports several months of shortness of breath. ? ?Patient also reports being prescribed steroids yesterday, states her blood sugar was elevated and she became disoriented. Reports being unable to make it to her ultrasound appointment yesterday because she was so confused. ? ?States that her d-dimer was elevated at Dr Bunnie Domino office.  ?

## 2021-12-16 NOTE — Telephone Encounter (Signed)
I spoke with pt; pt said rt leg pain and swelling has worsened; pt said now hurting behind rt knee up to midway lower rt hip. Pt said the dull pain is constant. Pt said she is worn out from trying to walk in her house. Pt said she is not able to do any housework or anything around her home for couple of days. Pt had CP and SOB last night. No CP so far today but pt is audibly SOB while talking on phone; offered to call EMS but pt said her grandson was with her now and he is taking her to [facility] ED now. Pt wanted Dr Darnell Level to be aware of what is going on but pt said she knows he is working from home today. Sending note to Dr Darnell Level, who is out of office; Dr Damita Dunnings who is in office and Lattie Haw CMA. I will teams Lattie Haw also.  ?

## 2021-12-16 NOTE — Telephone Encounter (Signed)
Spoke with pt asking how much of prednisone & diphenhydramine she took.  States she took all of it. ?

## 2021-12-16 NOTE — Telephone Encounter (Signed)
See mychart message.  ?Pt missed CTA chest yesterday - was unable to find location.  ?Ashtyn - any chance to get this scheduled for today to take advantage of prednisone she took yesterday? Recommend pt have driver.  ?Lattie Haw - plz call pt - she was prescribed prednisone '50mg'$  x3 and diphenhydramine '50mg'$  - how much of this did she take? Meds probably contributed to confusion in getting to location and if need to re-prescribe would do differently.  ?

## 2021-12-16 NOTE — ED Notes (Signed)
Pt. Refused covid test stating, "oh that's just a money maker." ?

## 2021-12-16 NOTE — ED Provider Notes (Signed)
? ?[facility] ?Provider Note ? ? ? Event Date/Time  ? First MD Initiated Contact with Patient 12/16/21 1734   ?  (approximate) ? ? ?History  ? ?Leg Swelling and Shortness of Breath ? ? ?HPI ? ?Cassandra Benson is a 81 y.o. female who presents to the ED for evaluation of Leg Swelling and Shortness of Breath ?  ?I reviewed PCP visit from 3/20 where patient was evaluated for right leg pain and swelling.  Fatigue, exertional dyspnea.  Outpatient venous ultrasound of this right leg is reviewed by me, performed on 3/21 and without evidence of DVT.  Most recent echo from 2021 with grade 2 diastolic dysfunction. ? ?Patient presents to the ED for evaluation of right leg swelling and pain.  Reports dyspnea on exertion and generalized weakness with exertion.  Denies fevers, cough or chest pressure.  Also does acknowledge her left leg is swollen.  Left leg is not painful. ? ?Physical Exam  ? ?Triage Vital Signs: ?ED Triage Vitals  ?Enc Vitals Group  ?   BP 12/16/21 1602 131/60  ?   Pulse Rate 12/16/21 1602 65  ?   Resp 12/16/21 1602 20  ?   Temp 12/16/21 1602 97.9 ?F (36.6 ?C)  ?   Temp Source 12/16/21 1602 Oral  ?   SpO2 12/16/21 1602 97 %  ?   Weight 12/16/21 1558 156 lb 4 oz (70.9 kg)  ?   Height 12/16/21 1558 5' 3.75" (1.619 m)  ?   Head Circumference --   ?   Peak Flow --   ?   Pain Score 12/16/21 1558 6  ?   Pain Loc --   ?   Pain Edu? --   ?   Excl. in [place]? --   ? ? ?Most recent vital signs: ?Vitals:  ? 12/16/21 1945 12/16/21 2000  ?BP:    ?Pulse: 60   ?Resp: 18 10  ?Temp:    ?SpO2: 98%   ? ? ?General: Awake, no distress.  Pleasant and conversational in full sentences.  Loquacious. ?CV:  Good peripheral perfusion.  ?Resp:  Normal effort.  ?Abd:  No distention.  ?MSK:  No deformity noted.  Bilateral pitting edema, right is somewhat worse than the left.  No skin changes to either side and is largely symmetric.  Full active and passive range of motion without signs of trauma. ?Neuro:  No focal  deficits appreciated. ?Other:   ? ? ?ED Results / Procedures / Treatments  ? ?Labs ?(all labs ordered are listed, but only abnormal results are displayed) ?Labs Reviewed  ?CBC WITH DIFFERENTIAL/PLATELET - Abnormal; Notable for the following components:  ?    Result Value  ? WBC 13.4 (*)   ? RBC 5.19 (*)   ? Neutro Abs 9.3 (*)   ? All other components within normal limits  ?COMPREHENSIVE METABOLIC PANEL - Abnormal; Notable for the following components:  ? Glucose, Bld 180 (*)   ? All other components within normal limits  ?CBG MONITORING, ED - Abnormal; Notable for the following components:  ? Glucose-Capillary 123 (*)   ? All other components within normal limits  ?RESP PANEL BY RT-PCR (FLU A&B, COVID) ARPGX2  ?BRAIN NATRIURETIC PEPTIDE  ? ? ?EKG ?Sinus rhythm, rate of 56 bpm.  Normal axis and intervals.  No evidence of acute ischemia. ? ?RADIOLOGY ?CXR reviewed by me without evidence of acute cardiopulmonary pathology. ? ?Official radiology report(s): ?DG Chest 2 View ? ?Result Date: 12/16/2021 ?CLINICAL  DATA:  Shortness of breath EXAM: CHEST - 2 VIEW COMPARISON:  Previous chest radiographs done on 09/10/2021 and CT done on 11/12/2021. FINDINGS: The heart size and mediastinal contours are within normal limits. Both lungs are clear. The visualized skeletal structures are unremarkable. IMPRESSION: No active cardiopulmonary disease. Electronically Signed   By: Elmer Picker M.D.   On: 12/16/2021 16:41   ? ?PROCEDURES and INTERVENTIONS: ? ?.1-3 Lead EKG Interpretation ?Performed by: Vladimir Crofts, MD ?Authorized by: Vladimir Crofts, MD  ? ?  Interpretation: normal   ?  ECG rate:  60 ?  ECG rate assessment: normal   ?  Rhythm: sinus rhythm   ?  Ectopy: none   ?  Conduction: normal   ? ?Medications  ?furosemide (LASIX) tablet 40 mg (40 mg Oral Given 12/16/21 1918)  ? ? ? ?IMPRESSION / MDM / ASSESSMENT AND PLAN / ED COURSE  ?I reviewed the triage vital signs and the nursing notes. ? ?81 year old female presents to the ED  with dyspnea on exertion and lower extremity swelling, likely exacerbation of her diastolic dysfunction and CHF, suitable for close outpatient follow-up with the CHF clinic.  She has normal vitals on room air and looks systemically well.  Does have lower extremity edema bilaterally, right greater than left.  She just had a negative venous ultrasound 2 days ago and I see no indications to repeat this.  Slight leukocytosis is noted and nonspecific.  Normal metabolic panel.  BNP higher than previous readings, but not "abnormal."  CXR is clear.  Her symptoms are most consistent with a mild exacerbation of CHF.  We discussed starting a low-dose of Lasix and follow-up closely with the CHF clinic.  We discussed appropriate return precautions for the ED and she is suitable for outpatient management.  I had considered observation admission, but considering her lack of continued chest pain and reassuring work-up I think this would be reasonable to be managed as an outpatient. ? ?Clinical Course as of 12/16/21 2334  ?Thu Dec 16, 2021  ?[place].  We discussed the possibility of CHF and following up with the CHF clinic.  We discussed return precautions for the ED. [DS]  ?  ?Clinical Course User Index ?[DS] Vladimir Crofts, MD  ? ? ? ?FINAL CLINICAL IMPRESSION(S) / ED DIAGNOSES  ? ?Final diagnoses:  ?Acute on chronic diastolic congestive heart failure ([place])  ? ? ? ?Rx / DC Orders  ? ?ED Discharge Orders   ? ?      Ordered  ?  furosemide (LASIX) 20 MG tablet  Daily       ? 12/16/21 1945  ?  AMB referral to CHF clinic       ? 12/16/21 1945  ? ?  ?  ? ?  ? ? ? ?Note:  This document was prepared using Dragon voice recognition software and may include unintentional dictation errors. ?  ?Vladimir Crofts, MD ?12/16/21 2335 ? ?

## 2021-12-16 NOTE — ED Notes (Signed)
ED Provider at bedside. 

## 2021-12-16 NOTE — Discharge Instructions (Signed)
Please start taking the Lasix 20 mg once daily alongside all of your other medications.  You should get a phone call tomorrow to be seen in the CHF clinic by the nurse practitioner, Darylene Price ?

## 2021-12-17 ENCOUNTER — Ambulatory Visit: Payer: HMO | Admitting: Family Medicine

## 2021-12-17 NOTE — Telephone Encounter (Signed)
Any update? I am out of the office this afternoon and will not be back until Monday.  ? ?If anything needed please contact the Referral Team to handle. Thanks! ?

## 2021-12-19 NOTE — Progress Notes (Signed)
Name: Cassandra Benson. Arocho  DOB:  October 23, 1940  MRN:  [id number]  Cassandra Benson is a 81 yo female with a PMHx significant for HTN, HLD, hypothyroidism, CAD, LADA, HA's, fibromyalgia, PAD, Sjogren's syndrome, and CHF.   In the ED on 12/16/21 with dyspnea on exertion and lower extremity swelling, felt to be an exacerbation of her diastolic dysfunction and CHF. She was given lasix 40 mg PO and d/c'd home with lasix 20 mg PO QD.   Echo from 07/30/20 reviewed and showed an EF of 55-60% with grade II diastolic dysfunction.   She presents today for her initial visit to the heart failure clinic with a c/c of SOB with exertion and fatigue. She states these symptoms have been present for some time. She also endorses pedal edema, cough, and palpitations. She denies CP, dizziness, syncope, orthopnea or weight gain.  She weighs herself every other day and says her weights do not fluctuate. Since her visit to the ED and starting lasix, she has had no further pedal edema.   Past Medical History:  Diagnosis Date   Acute diverticulitis 04/2021   [facility] ER, CT confirmed   Allergy    ANA positive    positive ANA pattern 1 speckled   Arthritis    Carotid stenosis, asymptomatic 06/19/2015   4-97% RICA 02-63% LICA rpt 1 yr (03/8587)    Colon polyps    COVID-19 virus infection 09/14/2021   Dermatomyositis ([place])    Diabetes mellitus without complication ([place])    Type 1   Diverticulosis    sigmoid on CT scan 12/2019   Family history of adverse reaction to anesthesia    brothr went into cardiac arrest from anectine   Fibromyalgia    prior PCP   GERD (gastroesophageal reflux disease)    prior PCP   Glaucoma    Narrow angle   History of blood clots    DVT, in 20s, none since   History of chicken pox    History of diverticulitis    History of pericarditis 1986   with hospitalization   History of pneumonia 2014   History of shingles    History of UTI    Hyperlipidemia    Hypertension    Hypothyroidism     Mixed connective tissue disease ([place])    Partial small bowel obstruction ([place]) 12/2019   managed conservatively   Peptic ulcer    Pneumonia    PONV (postoperative nausea and vomiting)    Raynaud's disease without gangrene    Shoulder pain left   h/o RTC tendonitis and adhesive capsulitis   Sjogren's syndrome ([place])    Sleep apnea    prior PCP - no CPAP for about 10 yrs   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[place])    Vitamin D deficiency    prior PCP   Past Surgical History:  Procedure Laterality Date   ABDOMINAL HYSTERECTOMY  1978   fibroids and menorrhagia, ovaries remain   ARTERY BIOPSY Right 04/06/2018   Procedure: BIOPSY TEMPORAL ARTERY RIGHT;  Surgeon: Beverly Gust, MD;  Location: [facility];  Service: ENT;  Laterality: Right;  Diabetic - insulin pump sleep apnea   [facility] normal per patient   COLONOSCOPY  10/2011   1 TA, 1 HP, very tortuous colon ([place])   COLONOSCOPY  02/2020   TA, inflammatory polyp, mod diverticulosis, int/ext hemorrhoids ([place]) no rpt recommended    COLONOSCOPY WITH ESOPHAGOGASTRODUODENOSCOPY (EGD)  03/2007   2 ulcers, benign polyp, rpt  5 yrs [facility])   JOINT REPLACEMENT Right    hip   PARTIAL HIP ARTHROPLASTY  2013   Right hip replacement   TONSILLECTOMY     TONSILLECTOMY AND ADENOIDECTOMY     TRANSCAROTID ARTERY REVASCULARIZATION  Left 07/23/2018   Procedure: TRANSCAROTID ARTERY REVASCULARIZATION;  Surgeon: Marty Heck, MD;  Location: [facility] OR;  Service: Vascular;  Laterality: Left;   TUBAL LIGATION     VAGINAL DELIVERY     x2, no complications   Family History  Problem Relation Age of Onset   CAD Mother 5       MI, aortic valve issues   COPD Mother    Lupus Mother    [name] Mother    Rheum arthritis Mother    CAD Father 93       CABG x2, aortic valve replacement   Stroke Sister    CAD Sister    21 Sister        brain   Lupus Sister    Diabetes Sister     Alcohol abuse Brother    CAD Brother 42       MI   Stroke Brother    Seizures Son    COPD Brother        agent orange   CAD Brother 45       stent   Diabetes Brother    Depression Grandchild    Breast cancer Maternal Aunt    Diabetes Sister    Breast cancer Sister    Breast cancer Maternal Aunt    Stroke Maternal Grandmother    Hypertension Maternal Grandmother    Gallbladder disease Maternal Grandmother    Colon cancer Neg Hx    Esophageal cancer Neg Hx    Rectal cancer Neg Hx    Stomach cancer Neg Hx    Social History   Tobacco Use   Smoking status: Never   Smokeless tobacco: Never  Substance Use Topics   Alcohol use: No   Allergies  Allergen Reactions   Iodinated Contrast Media Other (See Comments)    Itching (severe) and chest tightness   Penicillins Anaphylaxis, Swelling, Rash and Other (See Comments)    Has patient had a PCN reaction causing immediate rash, facial/tongue/throat swelling, SOB or lightheadedness with hypotension: Yes Has patient had a PCN reaction causing severe rash involving mucus membranes or skin necrosis: yes - remotely Has patient had a PCN reaction that required hospitalization: occurred while hospitalized Has patient had a PCN reaction occurring within the last 10 years: No If all of the above answers are "NO", then may proceed with Cephalosporin use.    Amlodipine Swelling    Pedal edema   Anectine [Succinylcholine] Other (See Comments)    Brother went into cardiac arrest.   Codeine Nausea Only   Gabapentin Other (See Comments)    Gait abnormality   Influenza Vaccines Other (See Comments)    Muscle weakness; unable to walk   Nortriptyline Other (See Comments)    Eye swelling and mouth drawed up   Pamelor [Nortriptyline Hcl] Other (See Comments)    Patient states caused her face to draw in together.   Valsartan Other (See Comments) and Cough    Allergy to generic only, "Hacking" cough   Zetia [Ezetimibe] Other (See Comments)     Bad muscle cramps   Erythromycin Rash and Swelling   Sulfa Antibiotics Rash   Prior to Admission medications   Medication Sig Start Date End Date Taking?  Authorizing Provider  acetaminophen (TYLENOL) 500 MG tablet Take 1 tablet (500 mg total) by mouth 3 (three) times daily as needed. 01/03/20   [name], MD  Ascorbic Acid (VITAMIN C) 100 MG tablet Take 100 mg by mouth daily.    [provider]  BD ULTRA-FINE LANCETS lancets Use as instructed upto 6 times daily 09/02/14   Phadke, Radhika P, MD  benzonatate (TESSALON) 100 MG capsule Take 1 capsule (100 mg total) by mouth 3 (three) times daily as needed for cough. 05/20/20   [name], MD  carvedilol (COREG) 3.125 MG tablet Take 1 tablet (3.125 mg total) by mouth 2 (two) times daily with a meal. 11/04/21   [name], MD  Cholecalciferol (VITAMIN D3) 25 MCG (1000 UT) CAPS Take 1 capsule (1,000 Units total) by mouth daily. 09/16/20   [name], MD  clopidogrel (PLAVIX) 75 MG tablet TAKE ONE TABLET BY MOUTH EVERY EVENING 07/15/21   Waynetta Sandy, MD  COMFORT EZ PEN NEEDLES 32G X 4 MM MISC Use 4-5x a day 11/18/21   Philemon Kingdom, MD  Continuous Blood Gluc Transmit (DEXCOM G6 TRANSMITTER) MISC 1 kit by Does not apply route See admin instructions. Use to check blood sugar 09/18/19   Philemon Kingdom, MD  diclofenac Sodium (VOLTAREN) 1 % GEL Apply 2 g topically 3 (three) times daily. 03/15/20   [name], MD  furosemide (LASIX) 20 MG tablet Take 1 tablet (20 mg total) by mouth daily. 12/16/21 12/16/22  Vladimir Crofts, MD  Glucagon, rDNA, (GLUCAGON EMERGENCY) 1 MG KIT INJECT into THE muscle ONCE AS NEEDED FOR emergency 11/18/21   Philemon Kingdom, MD  Insulin Aspart FlexPen (NOVOLOG) 100 UNIT/ML USE UP TO 50 UNITS DAILY in insulin pump Patient taking differently: 10 - 12 units with each meal 05/20/21   Philemon Kingdom, MD  losartan (COZAAR) 25 MG tablet TAKE ONE TABLET BY MOUTH ONCE DAILY 09/22/21    [name], MD  meloxicam (MOBIC) 7.5 MG tablet Take 1 tablet (7.5 mg total) by mouth daily. 11/17/21   Lesleigh Noe, MD  nitroGLYCERIN (NITROSTAT) 0.4 MG SL tablet Place 1 tablet (0.4 mg total) under the tongue every 5 (five) minutes as needed for chest pain. 02/05/21   Minna Merritts, MD  ONETOUCH ULTRA test strip USE TO check blood glucose FOUR TIMES DAILY 10/14/21   Philemon Kingdom, MD  predniSONE & diphenhydrAMINE 3 x 50 MG & 1 x 50 MG KIT Take by mouth as directed 12/14/21   [name], MD  predniSONE (DELTASONE) 50 MG tablet Take 1 tablet (50 mg) 13 hours prior to test, take 1 tablet (50 mg) 7 hours prior to test, take 1 tablet (50 mg) 1 hour prior to test 11/02/21   Minna Merritts, MD  rosuvastatin (CRESTOR) 20 MG tablet TAKE ONE TABLET BY MOUTH EVERY EVENING 09/22/21   [name], MD  SYNTHROID 75 MCG tablet TAKE ONE TABLET BY MOUTH monday-saturday before breakfast 09/20/21   [name], MD  topiramate (TOPAMAX) 50 MG tablet Take 1 tablet (50 mg total) by mouth 2 (two) times daily. 09/08/21   Penumalli, Earlean Polka, MD  TRESIBA FLEXTOUCH 100 UNIT/ML FlexTouch Pen Inject 46 Units into the skin daily. 12/14/21   Philemon Kingdom, MD  Ubrogepant (UBRELVY) 50 MG TABS Take 50 mg by mouth as needed. May repeat x 1 tab after 2 hours; max 2 tabs per day or 8 per month 09/08/21   Penumalli, Earlean Polka, MD  venlafaxine (EFFEXOR) 37.5 MG tablet  Take 37.5 mg once a day for a week then increase to 37.5 mg twice a day and continue that dose 09/30/20   [provider]   Review of Systems  Constitutional:  Positive for malaise/fatigue.  HENT:  Positive for congestion.   Eyes:  Negative for blurred vision and double vision.  Respiratory:  Positive for cough. Negative for hemoptysis, sputum production, shortness of breath and wheezing.   Cardiovascular:  Positive for palpitations and leg swelling. Negative for chest pain, orthopnea and PND.  Gastrointestinal: Negative.    Genitourinary: Negative.   Musculoskeletal:  Positive for back pain.  Skin: Negative.   Neurological:  Negative for dizziness, tremors, weakness and headaches.  Endo/Heme/Allergies: Negative.   Psychiatric/Behavioral:  The patient is nervous/anxious.     Physical Exam Vitals and nursing note reviewed.  Constitutional:      General: She is not in acute distress.    Appearance: She is not ill-appearing.  Neck:     Vascular: No JVD.  Cardiovascular:     Rate and Rhythm: Normal rate and regular rhythm.     Heart sounds: Normal heart sounds.  Pulmonary:     Effort: Pulmonary effort is normal.     Breath sounds: Normal breath sounds. No decreased breath sounds, wheezing, rhonchi or rales.  Abdominal:     Palpations: Abdomen is soft.  Musculoskeletal:     Right lower leg: No edema.     Left lower leg: No edema.  Skin:    General: Skin is warm and dry.     Capillary Refill: Capillary refill takes less than 2 seconds.  Neurological:     General: No focal deficit present.     Mental Status: She is alert and oriented to person, place, and time.  Psychiatric:        Mood and Affect: Mood is anxious.    Vitals:   12/20/21 1227  BP: 133/66  Pulse: 91  Resp: 18  SpO2: 97%  Weight: 152 lb 8 oz (69.2 kg)  Height: '5\' 3"'  (1.6 m)   Wt Readings from Last 3 Encounters:  12/20/21 152 lb 8 oz (69.2 kg)  12/20/21 153 lb (69.4 kg)  12/16/21 156 lb 4 oz (70.9 kg)    Lab Results  Component Value Date   CREATININE 0.81 12/16/2021   CREATININE 0.99 12/13/2021   CREATININE 0.91 10/15/2021     Assessment and Plan:  Heart failure with preserved EF and structural changes - NYHA II - weighing every other day, will start weighing daily - discussed to call with a weight gain of 2 lbs/day or 5 lbs/week - adhering to low sodium diet and fluid restriction - has repeat echo on 01/06/22 - on cozaar, coreg, lasix - could not add SGLT2 due to LADA - will defer other GDMT until updated echo  results - will see Gollan on 02/18/22  2. HTN - BP today 133/66 - saw PCP Danise Mina) on 12/13/21; returns in May but encouraged to reach out to see if he would like to see her sooner - BMP 12/16/21 reviewed: Na 140, K 3.6, Cr 0.81, gfr > 60; repeat labs scheduled for next week   3. LADA - managed by endocrinology Renne Crigler on 11/09/21)   Medication bottles were brought and reviewed.  Return in 3 months, sooner if needed.

## 2021-12-20 ENCOUNTER — Encounter: Payer: Self-pay | Admitting: Family

## 2021-12-20 ENCOUNTER — Ambulatory Visit: Payer: HMO | Attending: Family | Admitting: Family

## 2021-12-20 ENCOUNTER — Ambulatory Visit (INDEPENDENT_AMBULATORY_CARE_PROVIDER_SITE_OTHER): Payer: PPO | Admitting: Medical

## 2021-12-20 ENCOUNTER — Encounter: Payer: Self-pay | Admitting: Medical

## 2021-12-20 ENCOUNTER — Other Ambulatory Visit: Payer: Self-pay

## 2021-12-20 VITALS — BP 118/60 | HR 70 | Ht 63.7 in | Wt 153.0 lb

## 2021-12-20 VITALS — BP 133/66 | HR 91 | Resp 18 | Ht 63.0 in | Wt 152.5 lb

## 2021-12-20 DIAGNOSIS — E039 Hypothyroidism, unspecified: Secondary | ICD-10-CM | POA: Diagnosis not present

## 2021-12-20 DIAGNOSIS — M3500 Sicca syndrome, unspecified: Secondary | ICD-10-CM | POA: Diagnosis not present

## 2021-12-20 DIAGNOSIS — I5032 Chronic diastolic (congestive) heart failure: Secondary | ICD-10-CM

## 2021-12-20 DIAGNOSIS — I10 Essential (primary) hypertension: Secondary | ICD-10-CM

## 2021-12-20 DIAGNOSIS — E785 Hyperlipidemia, unspecified: Secondary | ICD-10-CM

## 2021-12-20 DIAGNOSIS — R0600 Dyspnea, unspecified: Secondary | ICD-10-CM | POA: Diagnosis not present

## 2021-12-20 DIAGNOSIS — M797 Fibromyalgia: Secondary | ICD-10-CM | POA: Insufficient documentation

## 2021-12-20 DIAGNOSIS — E1169 Type 2 diabetes mellitus with other specified complication: Secondary | ICD-10-CM

## 2021-12-20 DIAGNOSIS — E1039 Type 1 diabetes mellitus with other diabetic ophthalmic complication: Secondary | ICD-10-CM | POA: Diagnosis not present

## 2021-12-20 DIAGNOSIS — M7989 Other specified soft tissue disorders: Secondary | ICD-10-CM | POA: Insufficient documentation

## 2021-12-20 DIAGNOSIS — R059 Cough, unspecified: Secondary | ICD-10-CM | POA: Insufficient documentation

## 2021-12-20 DIAGNOSIS — H42 Glaucoma in diseases classified elsewhere: Secondary | ICD-10-CM | POA: Insufficient documentation

## 2021-12-20 DIAGNOSIS — Z833 Family history of diabetes mellitus: Secondary | ICD-10-CM | POA: Insufficient documentation

## 2021-12-20 DIAGNOSIS — M549 Dorsalgia, unspecified: Secondary | ICD-10-CM | POA: Diagnosis not present

## 2021-12-20 DIAGNOSIS — R519 Headache, unspecified: Secondary | ICD-10-CM | POA: Diagnosis not present

## 2021-12-20 DIAGNOSIS — I2510 Atherosclerotic heart disease of native coronary artery without angina pectoris: Secondary | ICD-10-CM | POA: Diagnosis not present

## 2021-12-20 DIAGNOSIS — Z8249 Family history of ischemic heart disease and other diseases of the circulatory system: Secondary | ICD-10-CM | POA: Diagnosis not present

## 2021-12-20 DIAGNOSIS — R002 Palpitations: Secondary | ICD-10-CM | POA: Insufficient documentation

## 2021-12-20 DIAGNOSIS — Z794 Long term (current) use of insulin: Secondary | ICD-10-CM | POA: Diagnosis not present

## 2021-12-20 DIAGNOSIS — I6523 Occlusion and stenosis of bilateral carotid arteries: Secondary | ICD-10-CM

## 2021-12-20 DIAGNOSIS — I110 Hypertensive heart disease with heart failure: Secondary | ICD-10-CM | POA: Insufficient documentation

## 2021-12-20 DIAGNOSIS — Z8349 Family history of other endocrine, nutritional and metabolic diseases: Secondary | ICD-10-CM | POA: Diagnosis not present

## 2021-12-20 DIAGNOSIS — Z79899 Other long term (current) drug therapy: Secondary | ICD-10-CM | POA: Insufficient documentation

## 2021-12-20 DIAGNOSIS — E1051 Type 1 diabetes mellitus with diabetic peripheral angiopathy without gangrene: Secondary | ICD-10-CM | POA: Insufficient documentation

## 2021-12-20 DIAGNOSIS — R450 Nervousness: Secondary | ICD-10-CM | POA: Diagnosis not present

## 2021-12-20 DIAGNOSIS — E139 Other specified diabetes mellitus without complications: Secondary | ICD-10-CM

## 2021-12-20 NOTE — Patient Instructions (Signed)
Start to weigh daily.  ? ?If you have a weight gain of 2 lbs or more overnight or 5 lbs/week, call us or Dr. Rockey Situ. ? ?Restrict your fluid intake to ~ 64 ounces/day. ? ?Restrict your salt intake to ~ 2 grams/day.  ? ?Call Dr. Danise Mina to see if he wants to see you sooner since you do not have an appt with him until May. ? ?If you need anything or have questions, call us! ? ?Return in 3 months.  ? ? ?

## 2021-12-20 NOTE — Patient Instructions (Signed)
Medication Instructions:  ? ?Your physician recommends that you continue on your current medications as directed. Please refer to the Current Medication list given to you today.  ? ?*If you need a refill on your cardiac medications before your next appointment, please call your pharmacy* ? ? ?Lab Work: ?Your physician recommends that you return for lab work (BMET) in: 1 week ? ? ? ?Please return to our office on_____________________at______________am/pm  ? ?If you have labs (blood work) drawn today and your tests are completely normal, you will receive your results only by: ?MyChart Message (if you have MyChart) OR ?A paper copy in the mail ?If you have any lab test that is abnormal or we need to change your treatment, we will call you to review the results. ? ? ?Testing/Procedures: ? ?Your physician has requested that you have an echocardiogram. Echocardiography is a painless test that uses sound waves to create images of your heart. It provides your doctor with information about the size and shape of your heart and how well your heart?s chambers and valves are working. This procedure takes approximately one hour. There are no restrictions for this procedure.  ? ? ?Follow-Up: ?At [facility], you and your health needs are our priority.  As part of our continuing mission to provide you with exceptional heart care, we have created designated Provider Care Teams.  These Care Teams include your primary Cardiologist (physician) and Advanced Practice Providers (APPs -  Physician Assistants and Nurse Practitioners) who all work together to provide you with the care you need, when you need it. ? ?We recommend signing up for the patient portal called "MyChart".  Sign up information is provided on this After Visit Summary.  MyChart is used to connect with patients for Virtual Visits (Telemedicine).  Patients are able to view lab/test results, encounter notes, upcoming appointments, etc.  Non-urgent messages can be sent to  your provider as well.   ?To learn more about what you can do with MyChart, go to [email or web address].   ? ?Your next appointment:   ?1-2 month(s) ? ?The format for your next appointment:   ?In Person ? ?Provider:   ?Ida Rogue, MD  ? ? ?Other Instructions ?N/A ?

## 2021-12-20 NOTE — Progress Notes (Signed)
?Cardiology Office Note:   ? ?Date:  12/20/2021  ? ?ID:  Cassandra Benson, DOB 1941/05/09, MRN [id number] ? ?PCP:  [name], MD  ?[facility] Cardiologist:  Cassandra Rogue, MD  ?[facility] Electrophysiologist:  None  ? ?Referring MD: [name], MD  ? ?Chief Complaint: ER follow-up ? ?History of Present Illness:   ? ?Cassandra Benson is a 81 y.o. 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  ? ?2000 LHC without significant disease. ?03/2019 MPI without ischemia and ruled a low risk scan. ?07/2020 echo with EF 55 to 60%, G2 DD. ? ?Patient underwent Cardiac CTA 10/2019  for atypical chest pain. This showed no CAD with calcium score of 0.  ? ?ER visit 12/16/21 for Leg swelling and SOB. Vitals were normal. CXR with no active disease. She had negative venous US 2 days prior. BNP was normal. She was given lasix 33m daily.  ? ?Today, the patient reports lower leg swelling is better on lasix. Said swelling started 3 weeks ago. Denies significant salt intake. Drinks significant amount of water. Reports dyspnea on exertion over the last 3 weeks. Feels she can't catch her breath. Says it keeps her from doing normal activities. No orthopnea, pnd, palpitations. ? ? ?Past Medical History:  ?Diagnosis Date  ? Acute divert[facility]itis 04/2021  [facility] [place] [facility]  ? Allergy   ? ANA positive   ? positive ANA pattern 1 speckled  ? Arthritis   ? Carotid stenosis, asymptomatic 06/19/2015  ? 15-64%RICA 433-29%LICA rpt 1 yr (95/1884   ? Colon polyps   ? COVID-19 virus infection 09/14/2021  ? Dermatomyositis ([place]   ? Diabetes mellitus without complication ([place]   ? Type 1  ? Diverticulosis   ? sigmoid on CT scan 12/2019  ? Family history of adverse reaction to anesthesia   ? brothr went into cardiac arrest from anectine  ? Fibromyalgia   ? prior PCP  ? GERD (gastroesophageal  reflux disease)   ? prior PCP  ? Glaucoma   ? Narrow angle  ? History of blood clots   ? DVT, in 20s, none since  ? History of chicken pox   ? History of diverticulitis   ? History of pericarditis 1986  ? with hospitalization  ? History of pneumonia 2014  ? History of shingles   ? History of UTI   ? Hyperlipidemia   ? Hypertension   ? Hypothyroidism   ? Mixed connective tissue disease ([place]   ? Partial small bowel obstruction ([place] 12/2019  ? managed conservatively  ? Peptic ulcer   ? Pneumonia   ? PONV (postoperative nausea and vomiting)   ? Raynaud's disease without gangrene   ? Shoulder pain left  ? h/o RTC tendonitis and adhesive capsulitis  ? Sjogren's syndrome ([place]   ? Sleep apnea   ? prior PCP - no CPAP for about 10 yrs  ? 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[place]   ? Vitamin D deficiency   ? prior PCP  ? ? ?Past Surgical History:  ?Procedure Laterality Date  ? ABDOMINAL HYSTERECTOMY  1978  ? fibroids and menorrhagia, ovaries remain  ? ARTERY BIOPSY Right 04/06/2018  ? Procedure: BIOPSY TEMPORAL ARTERY RIGHT;  Surgeon: MBeverly Gust MD;  [place] [facility]  Service: ENT;  Laterality: Right;  Diabetic - insulin pump ?sleep apnea  ? CARDIAC CATHETERIZATION  [facility] normal per patient  ? COLONOSCOPY  10/2011  ? 1 TA, 1 HP, very tortuous colon ([place])  ? COLONOSCOPY  02/2020  ? TA, inflammatory polyp, mod diverticulosis, int/ext hemorrhoids ([place]) no rpt recommended   ? COLONOSCOPY WITH ESOPHAGOGASTRODUODENOSCOPY (EGD)  03/2007  ? 2 ulcers, benign polyp, rpt 5 yrs ([place])  ? JOINT REPLACEMENT Right   ? hip  ? PARTIAL HIP ARTHROPLASTY  2013  ? Right hip replacement  ? TONSILLECTOMY    ? TONSILLECTOMY AND ADENOIDECTOMY    ? TRANSCAROTID ARTERY REVASCULARIZATION?  Left 07/23/2018  ? Procedure: TRANSCAROTID ARTERY REVASCULARIZATION;  Surgeon: Marty Heck, MD;  Location: [facility];  Service: Vascular;  Laterality: Left;  ? TUBAL LIGATION    ? VAGINAL DELIVERY    ? x2, no  complications  ? ? ?Current Medications: ?Current Meds  ?Medication Sig  ? acetaminophen (TYLENOL) 500 MG tablet Take 1 tablet (500 mg total) by mouth 3 (three) times daily as needed.  ? Ascorbic Acid (VITAMIN C) 100 MG tablet Take 100 mg by mouth daily.  ? BD ULTRA-FINE LANCETS lancets Use as instructed upto 6 times daily  ? benzonatate (TESSALON) 100 MG capsule Take 1 capsule (100 mg total) by mouth 3 (three) times daily as needed for cough.  ? carvedilol (COREG) 3.125 MG tablet Take 1 tablet (3.125 mg total) by mouth 2 (two) times daily with a meal.  ? Cholecalciferol (VITAMIN D3) 25 MCG (1000 UT) CAPS Take 1 capsule (1,000 Units total) by mouth daily.  ? clopidogrel (PLAVIX) 75 MG tablet TAKE ONE TABLET BY MOUTH EVERY EVENING  ? COMFORT EZ PEN NEEDLES 32G X 4 MM MISC Use 4-5x a day  ? Continuous Blood Gluc Transmit (DEXCOM G6 TRANSMITTER) MISC 1 kit by Does not apply route See admin instructions. Use to check blood sugar  ? diclofenac Sodium (VOLTAREN) 1 % GEL Apply 2 g topically 3 (three) times daily.  ? furosemide (LASIX) 20 MG tablet Take 1 tablet (20 mg total) by mouth daily.  ? Glucagon, rDNA, (GLUCAGON EMERGENCY) 1 MG KIT INJECT into THE muscle ONCE AS NEEDED FOR emergency  ? Insulin Aspart FlexPen (NOVOLOG) 100 UNIT/ML USE UP TO 50 UNITS DAILY in insulin pump (Patient taking differently: 10 - 12 units with each meal)  ? losartan (COZAAR) 25 MG tablet TAKE ONE TABLET BY MOUTH ONCE DAILY  ? meloxicam (MOBIC) 7.5 MG tablet Take 1 tablet (7.5 mg total) by mouth daily. (Patient not taking: Reported on 12/20/2021)  ? nitroGLYCERIN (NITROSTAT) 0.4 MG SL tablet Place 1 tablet (0.4 mg total) under the tongue every 5 (five) minutes as needed for chest pain. (Patient not taking: Reported on 12/20/2021)  ? ONETOUCH ULTRA test strip USE TO check blood glucose FOUR TIMES DAILY  ? predniSONE & diphenhydrAMINE 3 x 50 MG & 1 x 50 MG KIT Take by mouth as directed  ? rosuvastatin (CRESTOR) 20 MG tablet TAKE ONE TABLET BY  MOUTH EVERY EVENING  ? SYNTHROID 75 MCG tablet TAKE ONE TABLET BY MOUTH monday-saturday before breakfast  ? topiramate (TOPAMAX) 50 MG tablet Take 1 tablet (50 mg total) by mouth 2 (two) times daily.  ? TRESIBA FLEXTOUCH 100 UNIT/ML FlexTouch Pen Inject 46 Units into the skin daily.  ? Ubrogepant (UBRELVY) 50 MG TABS Take 50 mg by mouth as needed. May repeat x 1 tab after 2 hours; max 2 tabs per day or 8 per month (Patient not taking: Reported on 12/20/2021)  ?  venlafaxine (EFFEXOR) 37.5 MG tablet Take 37.5 mg once a day for a week then increase to 37.5 mg twice a day and continue that dose (Patient not taking: Reported on 12/20/2021)  ?  ? ?Allergies:   Iodinated contrast media, Penicillins, Amlodipine, Anectine [succinylcholine], Codeine, Gabapentin, Influenza vaccines, Nortriptyline, Pamelor [nortriptyline hcl], Valsartan, Zetia [ezetimibe], Erythromycin, and Sulfa antibiotics  ? ?Social History  ? ?Socioeconomic History  ? Marital status: Widowed  ?  Spouse name: Not on file  ? Number of children: 2  ? Years of education: Not on file  ? Highest education level: Not on file  ?Occupational History  ? Occupation: retired  ?Tobacco Use  ? Smoking status: Never  ? Smokeless tobacco: Never  ?Vaping Use  ? Vaping Use: Never used  ?Substance and Sexual Activity  ? Alcohol use: No  ? Drug use: No  ? Sexual activity: Not on file  ?Other Topics Concern  ? Not on file  ?Social History Narrative  ? Lives in [place], moved from [place].   ? Widow - husband decreased 01/2016 of metastatic colon CA  ? No pets.  ? Son Yolando Gillum lives nearby. Daughter lives in [place]. Sister lives 2 blocks away.   ? Grandson committed suicide in [place]   ? Work - retired, prior Production manager  ? Hobbies - works with her church, [employer]  ? Exercise - limited  ? Diet - good water, fruits/vegetables daily, limited meat, protein drink every morning  ? ?Social Determinants of Health  ? ?Financial Resource Strain: Low Risk   ?  Difficulty of Paying Living Expenses: Not hard at all  ?Food Insecurity: No Food Insecurity  ? Worried About Charity fundraiser in the Last Year: Never true  ? Ran Out of Food in the Last Year: Never true  ?Transportation

## 2021-12-20 NOTE — Telephone Encounter (Signed)
Defer to PCP, patient was seen at ER.   ?

## 2021-12-21 ENCOUNTER — Encounter: Payer: Self-pay | Admitting: Family Medicine

## 2021-12-22 ENCOUNTER — Other Ambulatory Visit: Payer: Self-pay

## 2021-12-22 ENCOUNTER — Emergency Department: Payer: HMO

## 2021-12-22 ENCOUNTER — Observation Stay
Admission: EM | Admit: 2021-12-22 | Discharge: 2021-12-24 | Disposition: A | Discharge status: Home Health | Payer: HMO | Attending: Hospitalist | Admitting: Hospitalist

## 2021-12-22 DIAGNOSIS — Z8616 Personal history of COVID-19: Secondary | ICD-10-CM | POA: Insufficient documentation

## 2021-12-22 DIAGNOSIS — Z8249 Family history of ischemic heart disease and other diseases of the circulatory system: Secondary | ICD-10-CM | POA: Diagnosis not present

## 2021-12-22 DIAGNOSIS — R0609 Other forms of dyspnea: Secondary | ICD-10-CM | POA: Diagnosis not present

## 2021-12-22 DIAGNOSIS — I509 Heart failure, unspecified: Secondary | ICD-10-CM | POA: Diagnosis not present

## 2021-12-22 DIAGNOSIS — Z794 Long term (current) use of insulin: Secondary | ICD-10-CM | POA: Diagnosis not present

## 2021-12-22 DIAGNOSIS — I10 Essential (primary) hypertension: Secondary | ICD-10-CM | POA: Diagnosis present

## 2021-12-22 DIAGNOSIS — Z8719 Personal history of other diseases of the digestive system: Secondary | ICD-10-CM | POA: Insufficient documentation

## 2021-12-22 DIAGNOSIS — Z91041 Radiographic dye allergy status: Secondary | ICD-10-CM | POA: Insufficient documentation

## 2021-12-22 DIAGNOSIS — Z20822 Contact with and (suspected) exposure to covid-19: Secondary | ICD-10-CM | POA: Diagnosis not present

## 2021-12-22 DIAGNOSIS — R0602 Shortness of breath: Principal | ICD-10-CM | POA: Insufficient documentation

## 2021-12-22 DIAGNOSIS — R002 Palpitations: Secondary | ICD-10-CM | POA: Insufficient documentation

## 2021-12-22 DIAGNOSIS — I080 Rheumatic disorders of both mitral and aortic valves: Secondary | ICD-10-CM | POA: Insufficient documentation

## 2021-12-22 DIAGNOSIS — E785 Hyperlipidemia, unspecified: Secondary | ICD-10-CM | POA: Insufficient documentation

## 2021-12-22 DIAGNOSIS — M6281 Muscle weakness (generalized): Secondary | ICD-10-CM | POA: Insufficient documentation

## 2021-12-22 DIAGNOSIS — R2681 Unsteadiness on feet: Secondary | ICD-10-CM | POA: Insufficient documentation

## 2021-12-22 DIAGNOSIS — Z96641 Presence of right artificial hip joint: Secondary | ICD-10-CM | POA: Diagnosis not present

## 2021-12-22 DIAGNOSIS — R2689 Other abnormalities of gait and mobility: Secondary | ICD-10-CM | POA: Insufficient documentation

## 2021-12-22 DIAGNOSIS — Z79890 Hormone replacement therapy (postmenopausal): Secondary | ICD-10-CM | POA: Insufficient documentation

## 2021-12-22 DIAGNOSIS — E1051 Type 1 diabetes mellitus with diabetic peripheral angiopathy without gangrene: Secondary | ICD-10-CM | POA: Diagnosis not present

## 2021-12-22 DIAGNOSIS — I5021 Acute systolic (congestive) heart failure: Secondary | ICD-10-CM | POA: Insufficient documentation

## 2021-12-22 DIAGNOSIS — E038 Other specified hypothyroidism: Secondary | ICD-10-CM | POA: Diagnosis present

## 2021-12-22 DIAGNOSIS — G4730 Sleep apnea, unspecified: Secondary | ICD-10-CM | POA: Diagnosis not present

## 2021-12-22 DIAGNOSIS — I700 Atherosclerosis of aorta: Secondary | ICD-10-CM | POA: Insufficient documentation

## 2021-12-22 DIAGNOSIS — E1065 Type 1 diabetes mellitus with hyperglycemia: Secondary | ICD-10-CM | POA: Diagnosis not present

## 2021-12-22 DIAGNOSIS — I110 Hypertensive heart disease with heart failure: Secondary | ICD-10-CM | POA: Diagnosis not present

## 2021-12-22 DIAGNOSIS — G4733 Obstructive sleep apnea (adult) (pediatric): Secondary | ICD-10-CM | POA: Diagnosis present

## 2021-12-22 DIAGNOSIS — E139 Other specified diabetes mellitus without complications: Secondary | ICD-10-CM | POA: Diagnosis present

## 2021-12-22 DIAGNOSIS — Z7902 Long term (current) use of antithrombotics/antiplatelets: Secondary | ICD-10-CM | POA: Insufficient documentation

## 2021-12-22 DIAGNOSIS — R0902 Hypoxemia: Secondary | ICD-10-CM | POA: Insufficient documentation

## 2021-12-22 DIAGNOSIS — R091 Pleurisy: Secondary | ICD-10-CM | POA: Insufficient documentation

## 2021-12-22 DIAGNOSIS — R296 Repeated falls: Secondary | ICD-10-CM | POA: Insufficient documentation

## 2021-12-22 DIAGNOSIS — E13319 Other specified diabetes mellitus with unspecified diabetic retinopathy without macular edema: Secondary | ICD-10-CM | POA: Diagnosis present

## 2021-12-22 DIAGNOSIS — E063 Autoimmune thyroiditis: Secondary | ICD-10-CM | POA: Diagnosis present

## 2021-12-22 DIAGNOSIS — R9431 Abnormal electrocardiogram [ECG] [EKG]: Secondary | ICD-10-CM | POA: Insufficient documentation

## 2021-12-22 DIAGNOSIS — Z8261 Family history of arthritis: Secondary | ICD-10-CM | POA: Diagnosis not present

## 2021-12-22 DIAGNOSIS — I2510 Atherosclerotic heart disease of native coronary artery without angina pectoris: Secondary | ICD-10-CM | POA: Insufficient documentation

## 2021-12-22 DIAGNOSIS — Z79899 Other long term (current) drug therapy: Secondary | ICD-10-CM | POA: Insufficient documentation

## 2021-12-22 DIAGNOSIS — E039 Hypothyroidism, unspecified: Secondary | ICD-10-CM | POA: Insufficient documentation

## 2021-12-22 DIAGNOSIS — R0600 Dyspnea, unspecified: Secondary | ICD-10-CM | POA: Insufficient documentation

## 2021-12-22 DIAGNOSIS — M3500 Sicca syndrome, unspecified: Secondary | ICD-10-CM | POA: Insufficient documentation

## 2021-12-22 DIAGNOSIS — M1990 Unspecified osteoarthritis, unspecified site: Secondary | ICD-10-CM | POA: Insufficient documentation

## 2021-12-22 DIAGNOSIS — Z833 Family history of diabetes mellitus: Secondary | ICD-10-CM | POA: Insufficient documentation

## 2021-12-22 DIAGNOSIS — R519 Headache, unspecified: Secondary | ICD-10-CM | POA: Insufficient documentation

## 2021-12-22 DIAGNOSIS — I959 Hypotension, unspecified: Secondary | ICD-10-CM | POA: Diagnosis not present

## 2021-12-22 DIAGNOSIS — M797 Fibromyalgia: Secondary | ICD-10-CM | POA: Insufficient documentation

## 2021-12-22 DIAGNOSIS — R739 Hyperglycemia, unspecified: Secondary | ICD-10-CM | POA: Diagnosis not present

## 2021-12-22 DIAGNOSIS — Z9181 History of falling: Secondary | ICD-10-CM | POA: Insufficient documentation

## 2021-12-22 DIAGNOSIS — R079 Chest pain, unspecified: Secondary | ICD-10-CM | POA: Insufficient documentation

## 2021-12-22 DIAGNOSIS — Z7901 Long term (current) use of anticoagulants: Secondary | ICD-10-CM | POA: Insufficient documentation

## 2021-12-22 DIAGNOSIS — R069 Unspecified abnormalities of breathing: Secondary | ICD-10-CM | POA: Diagnosis not present

## 2021-12-22 DIAGNOSIS — E109 Type 1 diabetes mellitus without complications: Secondary | ICD-10-CM | POA: Insufficient documentation

## 2021-12-22 LAB — BASIC METABOLIC PANEL
Anion gap: 6 (ref 5–15)
BUN: 16 mg/dL (ref 8–23)
CO2: 26 mmol/L (ref 22–32)
Calcium: 9.1 mg/dL (ref 8.9–10.3)
Chloride: 102 mmol/L (ref 98–111)
Creatinine, Ser: 0.98 mg/dL (ref 0.44–1.00)
GFR, Estimated: 59 mL/min — ABNORMAL LOW (ref >60)
Glucose, Bld: 423 mg/dL — ABNORMAL HIGH (ref 70–99)
Potassium: 4.1 mmol/L (ref 3.5–5.1)
Sodium: 134 mmol/L — ABNORMAL LOW (ref 135–145)

## 2021-12-22 LAB — RESP PANEL BY RT-PCR (FLU A&B, COVID) ARPGX2
Influenza A by PCR: NEGATIVE
Influenza B by PCR: NEGATIVE
SARS Coronavirus 2 by RT PCR: NEGATIVE

## 2021-12-22 LAB — CBC
HCT: 43.9 % (ref 36.0–46.0)
Hemoglobin: 13.6 g/dL (ref 12.0–15.0)
MCH: 25.8 pg — ABNORMAL LOW (ref 26.0–34.0)
MCHC: 31 g/dL (ref 30.0–36.0)
MCV: 83.1 fL (ref 80.0–100.0)
Platelets: 208 10*3/uL (ref 150–400)
RBC: 5.28 MIL/uL — ABNORMAL HIGH (ref 3.87–5.11)
RDW: 14.3 % (ref 11.5–15.5)
WBC: 8.1 10*3/uL (ref 4.0–10.5)
nRBC: 0 % (ref 0.0–0.2)

## 2021-12-22 LAB — BRAIN NATRIURETIC PEPTIDE: B Natriuretic Peptide: 22.2 pg/mL (ref 0.0–100.0)

## 2021-12-22 LAB — CBG MONITORING, ED
Glucose-Capillary: 62 mg/dL — ABNORMAL LOW (ref 70–99)
Glucose-Capillary: 283 mg/dL — ABNORMAL HIGH (ref 70–99)

## 2021-12-22 LAB — TROPONIN I (HIGH SENSITIVITY)
Troponin I (High Sensitivity): 5 ng/L (ref <18)
Troponin I (High Sensitivity): 5 ng/L (ref <18)

## 2021-12-22 MED ORDER — INSULIN ASPART 100 UNIT/ML IJ SOLN
0.0000 [IU] | Freq: Every day | INTRAMUSCULAR | Status: DC
  Administered 2021-12-22: 3 [IU] via SUBCUTANEOUS
  Administered 2021-12-23: 4 [IU] via SUBCUTANEOUS
  Filled 2021-12-22 (×2): qty 1

## 2021-12-22 MED ORDER — ONDANSETRON HCL 4 MG/2ML IJ SOLN: 4.0000 mg | Freq: Four times a day (QID) | INTRAMUSCULAR | Status: DC | PRN

## 2021-12-22 MED ORDER — ROSUVASTATIN CALCIUM 10 MG PO TABS
20.0000 mg | ORAL_TABLET | Freq: Every evening | ORAL | Status: DC
  Administered 2021-12-23: 20 mg via ORAL
  Filled 2021-12-22: qty 2

## 2021-12-22 MED ORDER — LEVOTHYROXINE SODIUM 50 MCG PO TABS
75.0000 ug | ORAL_TABLET | Freq: Every day | ORAL | Status: DC
  Administered 2021-12-23 – 2021-12-24 (×2): 75 ug via ORAL
  Filled 2021-12-22 (×2): qty 1

## 2021-12-22 MED ORDER — TOPIRAMATE 25 MG PO TABS
50.0000 mg | ORAL_TABLET | Freq: Two times a day (BID) | ORAL | Status: DC
  Administered 2021-12-23 – 2021-12-24 (×3): 50 mg via ORAL
  Filled 2021-12-22 (×4): qty 2

## 2021-12-22 MED ORDER — ACETAMINOPHEN 650 MG RE SUPP: 650.0000 mg | Freq: Four times a day (QID) | RECTAL | Status: DC | PRN

## 2021-12-22 MED ORDER — FUROSEMIDE 20 MG PO TABS
20.0000 mg | ORAL_TABLET | Freq: Every day | ORAL | Status: DC
  Administered 2021-12-24: 20 mg via ORAL
  Filled 2021-12-22: qty 1

## 2021-12-22 MED ORDER — NICOTINE 14 MG/24HR TD PT24
14.0000 mg | MEDICATED_PATCH | Freq: Every day | TRANSDERMAL | Status: DC
  Filled 2021-12-22 (×2): qty 1

## 2021-12-22 MED ORDER — ACETAMINOPHEN 325 MG PO TABS
650.0000 mg | ORAL_TABLET | Freq: Four times a day (QID) | ORAL | Status: DC | PRN
  Administered 2021-12-22 – 2021-12-24 (×3): 650 mg via ORAL
  Filled 2021-12-22 (×3): qty 2

## 2021-12-22 MED ORDER — FUROSEMIDE 10 MG/ML IJ SOLN
20.0000 mg | Freq: Two times a day (BID) | INTRAMUSCULAR | Status: AC
  Administered 2021-12-22 – 2021-12-23 (×2): 20 mg via INTRAVENOUS
  Filled 2021-12-22: qty 4
  Filled 2021-12-22 (×2): qty 2

## 2021-12-22 MED ORDER — CARVEDILOL 3.125 MG PO TABS
3.1250 mg | ORAL_TABLET | Freq: Two times a day (BID) | ORAL | Status: DC
  Administered 2021-12-23 – 2021-12-24 (×3): 3.125 mg via ORAL
  Filled 2021-12-22 (×3): qty 1

## 2021-12-22 MED ORDER — HYDRALAZINE HCL 20 MG/ML IJ SOLN: 5.0000 mg | INTRAMUSCULAR | Status: DC | PRN

## 2021-12-22 MED ORDER — INSULIN ASPART 100 UNIT/ML IJ SOLN
0.0000 [IU] | Freq: Three times a day (TID) | INTRAMUSCULAR | Status: DC
  Administered 2021-12-23: 2 [IU] via SUBCUTANEOUS
  Administered 2021-12-23 (×2): 15 [IU] via SUBCUTANEOUS
  Administered 2021-12-24: 2 [IU] via SUBCUTANEOUS
  Filled 2021-12-22 (×4): qty 1

## 2021-12-22 MED ORDER — CLOPIDOGREL BISULFATE 75 MG PO TABS
75.0000 mg | ORAL_TABLET | Freq: Every evening | ORAL | Status: DC
  Administered 2021-12-23: 75 mg via ORAL
  Filled 2021-12-22: qty 1

## 2021-12-22 MED ORDER — ONDANSETRON HCL 4 MG PO TABS: 4.0000 mg | ORAL_TABLET | Freq: Four times a day (QID) | ORAL | Status: DC | PRN

## 2021-12-22 MED ORDER — HYDROCORTISONE SOD SUC (PF) 250 MG IJ SOLR
200.0000 mg | Freq: Once | INTRAMUSCULAR | Status: DC
  Filled 2021-12-22: qty 200

## 2021-12-22 MED ORDER — ALBUTEROL SULFATE HFA 108 (90 BASE) MCG/ACT IN AERS: 2.0000 | INHALATION_SPRAY | RESPIRATORY_TRACT | Status: DC | PRN

## 2021-12-22 MED ORDER — DIPHENHYDRAMINE HCL 50 MG/ML IJ SOLN: 50.0000 mg | Freq: Once | INTRAMUSCULAR | Status: DC

## 2021-12-22 MED ORDER — SODIUM CHLORIDE 0.9 % IV BOLUS
500.0000 mL | Freq: Once | INTRAVENOUS | Status: AC
Start: 2021-12-22 — End: 2021-12-22
  Administered 2021-12-22: 500 mL via INTRAVENOUS

## 2021-12-22 MED ORDER — ENOXAPARIN SODIUM 40 MG/0.4ML IJ SOSY
40.0000 mg | PREFILLED_SYRINGE | INTRAMUSCULAR | Status: DC
  Administered 2021-12-22 – 2021-12-23 (×2): 40 mg via SUBCUTANEOUS
  Filled 2021-12-22 (×3): qty 0.4

## 2021-12-22 MED ORDER — FUROSEMIDE 40 MG PO TABS: 20.0000 mg | ORAL_TABLET | Freq: Every day | ORAL | Status: DC

## 2021-12-22 MED ORDER — DIPHENHYDRAMINE HCL 25 MG PO CAPS: 50.0000 mg | ORAL_CAPSULE | Freq: Once | ORAL | Status: DC

## 2021-12-22 MED ORDER — SODIUM CHLORIDE 0.9% FLUSH
3.0000 mL | Freq: Two times a day (BID) | INTRAVENOUS | Status: DC
  Administered 2021-12-22 – 2021-12-23 (×2): 3 mL via INTRAVENOUS

## 2021-12-22 MED ORDER — ACETAMINOPHEN 500 MG PO TABS: 500.0000 mg | ORAL_TABLET | Freq: Three times a day (TID) | ORAL | Status: DC | PRN

## 2021-12-22 NOTE — ED Notes (Signed)
When walking pt on O2 monitor she became horse sounding , short of breath and off balance. Rn aware and provider aware   ?

## 2021-12-22 NOTE — ED Provider Notes (Signed)
? ? ?[facility] ?Emergency Department Provider Note ? ? ? ? Event Date/Time  ? First MD Initiated Contact with Patient 12/22/21 1340   ?  (approximate) ? ? ?History  ? ?Shortness of Breath ? ? ?HPI ? ?Cassandra Benson is a 81 y.o. female with a history of hypertension, CHF, autoimmunie diabetes, arthritis, hypothyroidism, CAD/PVD on Eliquis who presents to the ED with several days of progressively worsening dyspnea on exertion.  Patient with a history of CHF was recently evaluated in the ED for some peripheral edema and found to have exacerbation of her CHF patient was started on Lasix, reports significant improvement of her leg swelling.  She had subsequently been followed by Dr. Doug [facility] failure clinic, based on chart review, for the ED follow-up.  Patient noted to today and crease in her shortness of breath tickly with walking around the house patient denies any syncope, frank chest pain, nausea, vomiting, or dizziness.  Patient also noted elevated blood glucose at home, and took her prescribed home insulin doses.  She presents to the ED via EMS and was noted to have a CBG at 559 per EMS report.  Patient does not have an O2 saturation requirement, and denies any history or recent complaints of cough, congestion, or shortness of breath. ?  ? ? ?Physical Exam  ? ?Triage Vital Signs: ?ED Triage Vitals  ?Enc Vitals Group  ?   BP 12/22/21 1238 (!) 123/51  ?   Pulse Rate 12/22/21 1238 65  ?   Resp 12/22/21 1238 18  ?   Temp 12/22/21 1238 97.9 ?F (36.6 ?C)  ?   Temp Source 12/22/21 1238 Oral  ?   SpO2 12/22/21 1238 100 %  ?   Weight --   ?   Height --   ?   Head Circumference --   ?   Peak Flow --   ?   Pain Score 12/22/21 1207 0  ?   Pain Loc --   ?   Pain Edu? --   ?   Excl. in [place]? --   ? ? ?Most recent vital signs: ?Vitals:  ? 12/22/21 1800 12/22/21 1830  ?BP: (!) 123/58 (!) 108/47  ?Pulse: 91 77  ?Resp: 20   ?Temp:    ?SpO2: 95%   ? ? ?General Awake, no distress. Talkative and  comfortable ?CV:  Good peripheral perfusion. RRR. No peripheral edema noted ?RESP:  Normal effort. CTA ?ABD:  No distention. Soft, nontender ? ?ED Results / Procedures / Treatments  ? ?Labs ?(all labs ordered are listed, but only abnormal results are displayed) ?Labs Reviewed  ?CBC - Abnormal; Notable for the following components:  ?    Result Value  ? RBC 5.28 (*)   ? MCH 25.8 (*)   ? All other components within normal limits  ?BASIC METABOLIC PANEL - Abnormal; Notable for the following components:  ? Sodium 134 (*)   ? Glucose, Bld 423 (*)   ? GFR, Estimated 59 (*)   ? All other components within normal limits  ?CBG MONITORING, ED - Abnormal; Notable for the following components:  ? Glucose-Capillary 62 (*)   ? All other components within normal limits  ?RESP PANEL BY RT-PCR (FLU A&B, COVID) ARPGX2  ?BRAIN NATRIURETIC PEPTIDE  ?TROPONIN I (HIGH SENSITIVITY)  ?TROPONIN I (HIGH SENSITIVITY)  ? ? ? ?EKG ? ?Vent. rate 64 BPM ?PR interval 198 ms ?QRS duration 76 ms ?QT/QTcB 392/404 ms ?P-R-T axes 49 37 27 ?Normal  axis ?No STEMI ? ?RADIOLOGY ? ?I personally viewed and evaluated these images as part of my medical decision making, as well as reviewing the written report by the radiologist. ? ?ED Provider Interpretation: no acute findings} ? ?DG Chest 2 View ? ?Result Date: 12/22/2021 ?CLINICAL DATA:  Shortness of breath. EXAM: CHEST - 2 VIEW COMPARISON:  12/16/2021 FINDINGS: The heart size and mediastinal contours are within normal limits. Both lungs are clear. The visualized skeletal structures are unremarkable. IMPRESSION: No active cardiopulmonary disease. Electronically Signed   By: Kerby Moors M.D.   On: 12/22/2021 12:38   ? ? ?PROCEDURES: ? ?Critical Care performed: No ? ?Procedures ? ? ?MEDICATIONS ORDERED IN ED: ?Medications  ?albuterol (VENTOLIN HFA) 108 (90 Base) MCG/ACT inhaler 2 puff (has no administration in time range)  ?hydrocortisone sodium succinate (SOLU-CORTEF) injection 200 mg (0 mg Intravenous Hold  12/22/21 1857)  ?diphenhydrAMINE (BENADRYL) capsule 50 mg (has no administration in time range)  ?  Or  ?diphenhydrAMINE (BENADRYL) injection 50 mg (has no administration in time range)  ?carvedilol (COREG) tablet 3.125 mg (has no administration in time range)  ?rosuvastatin (CRESTOR) tablet 20 mg (has no administration in time range)  ?clopidogrel (PLAVIX) tablet 75 mg (has no administration in time range)  ?acetaminophen (TYLENOL) tablet 500 mg (has no administration in time range)  ?furosemide (LASIX) tablet 20 mg (has no administration in time range)  ?levothyroxine (SYNTHROID) tablet 75 mcg (has no administration in time range)  ?topiramate (TOPAMAX) tablet 50 mg (has no administration in time range)  ?sodium chloride 0.9 % bolus 500 mL (0 mLs Intravenous Stopped 12/22/21 1651)  ? ? ? ?IMPRESSION / MDM / ASSESSMENT AND PLAN / ED COURSE  ?I reviewed the triage vital signs and the nursing notes. ?             ?               ? ?Differential diagnosis includes, but is not limited to, viral syndrome, bronchitis including COPD exacerbation, pneumonia, reactive airway disease including asthma, CHF including exacerbation with or without pulmonary/interstitial edema, pneumothorax, ACS, thoracic trauma, and pulmonary embolism. ? ?The patient is on the cardiac monitor to evaluate for evidence of arrhythmia and/or significant heart rate changes. ? ?Geriatric patient with a history of CHF, PAD/CAD on Eliquis, and autoimmune diabetes presents to the ED for evaluation of dyspnea on exertion.  Patient has had several days of this complaint, but symptoms probably worsened today.  She was advised to report to the ED after talking to the on-call nurse for her cardiologist.  Patient presents in no acute distress.  Patient without any signs of hypoxia while at rest on room air.  No history of oxygen supplementation requirement.  Ambulatory O2 saturations however, did show O2 sats drop to 74% on room air.  Patient was able to recover  when she Pauls walking and talking.  Patient's diagnosis is consistent with acute hypoxia, dyspnea on exertion, CHF. Patient will be admitted to the hospital service for further evaluation and work-up.  Spoke with Dr. Florina Ou, who will evaluate the patient in the ED.  Patient has a CT angio pending, but requires preprocedural anaphylaxis prophylaxis.  Patient is self is aware of the plan for admission and is agreeable to the plan.   ? ? ?FINAL CLINICAL IMPRESSION(S) / ED DIAGNOSES  ? ?Final diagnoses:  ?Hypoxia  ?DOE (dyspnea on exertion)  ?Chronic congestive heart failure, unspecified heart failure type ([place])  ? ? ? ?Rx /  DC Orders  ? ?ED Discharge Orders   ? ? None  ? ?  ? ? ? ?Note:  This document was prepared using Dragon voice recognition software and may include unintentional dictation errors. ? ?  ?Arisbeth Purrington, Dannielle Karvonen, PA-C ?12/22/21 2014 ? ?  ?Rada Hay, MD ?12/24/21 0050 ? ?

## 2021-12-22 NOTE — ED Notes (Signed)
Pharmacy notified of need for solucortef to be given as premedication for CT contrast dye.  ?

## 2021-12-22 NOTE — Telephone Encounter (Signed)
I spoke with pt who said she saw NP with Cardiology on 12/20/21 and pt is very SOB on phone. Pt said just talking makes her SOB and when she moves around SOB worsens. Pt said she has not called cardiology or endo. Pt said FBS this moring was 287 and pt takes 2 types of insulin and has not been eating very much. Pt said Dr Cruzita Lederer told her the elevated BS was from inflammation rather than what pt is eating. Pt is also taking prednisone. Pt was very concerned about her mental status the other day. Pt has had to pause several times while talking with me as if to collect her thoughts. Pt said she does not have extremity weakness but does have general weakness and feels very tired. Rt leg is not swollen but is hot to touch. Pt said she has CP on and off. Pt is not having CP now. BP now 152/74 P 80.Pt said she does not think she can drive today. Pt declines 911. Pt said she does not know what to do. Pt said "I would have to feel better to die". I advised pt as SOB as she is I think pt should be seen. Pt did not want me to contact her son because it would scare him. Pt is going to call her son to take pt to ED because she is feeling worse and if she cannot get son pt will call 911. Pt has already scheduled appt with Dr Darnell Level on 12/24/21 at advise of card to see PCP prior to regularly scheduled appt in May. ED precautions given again and pt voiced understanding. Sending not to Dr Darnell Level and Lattie Haw CMA. And I have spoke with [facility] CMA. ?

## 2021-12-22 NOTE — ED Notes (Signed)
Pt. Repositioned for comfort, external urinary catheter applied. Pt. Updated on POC. ?

## 2021-12-22 NOTE — H&P (Signed)
?History and Physical  ? ? ?Patient: Cassandra Benson QQV:[id number] DOB: 10-Jan-1941 ?DOA: 12/22/2021 ?DOS: the patient was seen and examined on 12/23/2021 ?PCP: [name], MD  ?Patient coming from: Home ? ?Chief Complaint:  ?Chief Complaint  ?Patient presents with  ? Shortness of Breath  ? ?HPI: Cassandra Benson is a 81 y.o. female with medical history significant of diverticulitis ANA positive, CHF, diabetes mellitus type 1, history of COVID-19, presenting today with complains of shortness of breath this been going on for past few days. ?Patient has dyspnea on exertion on ambulation and drops her oxygen saturation.   ? ?Review of Systems: Review of Systems  ?Respiratory:  Positive for sputum production.   ?Cardiovascular:  Positive for palpitations.  ? ?Past Medical History:  ?Diagnosis Date  ? Acute diverticulitis 04/2021  ? [facility], CT confirmed  ? Allergy   ? ANA positive   ? positive ANA pattern 1 speckled  ? Arthritis   ? Carotid stenosis, asymptomatic 06/19/2015  ? 3-32% RICA 95-18% LICA rpt 1 yr (04/4165)   ? CHF (congestive heart failure) ([place])   ? Colon polyps   ? COVID-19 virus infection 09/14/2021  ? Dermatomyositis ([place])   ? Diabetes mellitus without complication ([place])   ? Type 1  ? Diverticulosis   ? sigmoid on CT scan 12/2019  ? Family history of adverse reaction to anesthesia   ? brothr went into cardiac arrest from anectine  ? Fibromyalgia   ? prior PCP  ? GERD (gastroesophageal reflux disease)   ? prior PCP  ? Glaucoma   ? Narrow angle  ? History of blood clots   ? DVT, in 20s, none since  ? History of chicken pox   ? History of diverticulitis   ? History of pericarditis 1986  ? with hospitalization  ? History of pneumonia 2014  ? History of shingles   ? History of UTI   ? Hyperlipidemia   ? Hypertension   ? Hypothyroidism   ? Mixed connective tissue disease ([place])   ? Partial small bowel obstruction ([place]) 12/2019  ? managed conservatively  ? Peptic ulcer   ? Pneumonia   ? PONV  (postoperative nausea and vomiting)   ? Raynaud's disease without gangrene   ? Shoulder pain left  ? h/o RTC tendonitis and adhesive capsulitis  ? Sjogren's syndrome ([place])   ? Sleep apnea   ? prior PCP - no CPAP for about 10 yrs  ? 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[place])   ? Vitamin D deficiency   ? prior PCP  ? ?Past Surgical History:  ?Procedure Laterality Date  ? ABDOMINAL HYSTERECTOMY  1978  ? fibroids and menorrhagia, ovaries remain  ? ARTERY BIOPSY Right 04/06/2018  ? Procedure: BIOPSY TEMPORAL ARTERY RIGHT;  Surgeon: Beverly Gust, MD;  Location: [facility];  Service: ENT;  Laterality: Right;  Diabetic - insulin pump ?sleep apnea  ? CARDIAC CATHETERIZATION  2000  ? [facility] normal per patient  ? COLONOSCOPY  10/2011  ? 1 TA, 1 HP, very tortuous colon ([place])  ? COLONOSCOPY  02/2020  ? TA, inflammatory polyp, mod diverticulosis, int/ext hemorrhoids ([place]) no rpt recommended   ? COLONOSCOPY WITH ESOPHAGOGASTRODUODENOSCOPY (EGD)  03/2007  ? 2 ulcers, benign polyp, rpt 5 yrs ([place], [place])  ? JOINT REPLACEMENT Right   ? hip  ? PARTIAL HIP ARTHROPLASTY  2013  ? Right hip replacement  ? TONSILLECTOMY    ? TONSILLECTOMY AND ADENOIDECTOMY    ? TRANSCAROTID ARTERY  REVASCULARIZATION?  Left 07/23/2018  ? Procedure: TRANSCAROTID ARTERY REVASCULARIZATION;  Surgeon: Clark, Christopher J, MD;  Location: [facility] OR;  Service: Vascular;  Laterality: Left;  ? TUBAL LIGATION    ? VAGINAL DELIVERY    ? x2, no complications  ? ?Social History:  reports that she has never smoked. She has never used smokeless tobacco. She reports that she does not drink alcohol and does not use drugs. ? ?Allergies  ?Allergen Reactions  ? Iodinated Contrast Media Other (See Comments)  ?  Itching (severe) and chest tightness  ? Penicillins Anaphylaxis, Swelling, Rash and Other (See Comments)  ?  Has patient had a PCN reaction causing immediate rash, facial/tongue/throat swelling, SOB or lightheadedness with hypotension: Yes ?Has patient had a  PCN reaction causing severe rash involving mucus membranes or skin necrosis: yes - remotely ?Has patient had a PCN reaction that required hospitalization: occurred while hospitalized ?Has patient had a PCN reaction occurring within the last 10 years: No ?If all of the above answers are "NO", then may proceed with Cephalosporin use. ?  ? Amlodipine Swelling  ?  Pedal edema  ? Anectine [Succinylcholine] Other (See Comments)  ?  Brother went into cardiac arrest.  ? Codeine Nausea Only  ? Gabapentin Other (See Comments)  ?  Gait abnormality  ? Influenza Vaccines Other (See Comments)  ?  Muscle weakness; unable to walk  ? Nortriptyline Other (See Comments)  ?  Eye swelling and mouth drawed up  ? Pamelor [Nortriptyline Hcl] Other (See Comments)  ?  Patient states caused her face to draw in together.  ? Valsartan Other (See Comments) and Cough  ?  Allergy to generic only, "Hacking" cough  ? Zetia [Ezetimibe] Other (See Comments)  ?  Bad muscle cramps  ? Erythromycin Rash and Swelling  ? Sulfa Antibiotics Rash  ? ? ?Family History  ?Problem Relation Age of Onset  ? CAD Mother 70  ?     MI, aortic valve issues  ? COPD Mother   ? Lupus Mother   ? Graves' disease Mother   ? Rheum arthritis Mother   ? CAD Father 70  ?     CABG x2, aortic valve replacement  ? Stroke Sister   ? CAD Sister   ? Anuerysm Sister   ?     brain  ? Lupus Sister   ? Diabetes Sister   ? Alcohol abuse Brother   ? CAD Brother 41  ?     MI  ? Stroke Brother   ? Seizures Son   ? COPD Brother   ?     agent orange  ? CAD Brother 60  ?     stent  ? Diabetes Brother   ? Depression Grandchild   ? Breast cancer Maternal Aunt   ? Diabetes Sister   ? Breast cancer Sister   ? Breast cancer Maternal Aunt   ? Stroke Maternal Grandmother   ? Hypertension Maternal Grandmother   ? Gallbladder disease Maternal Grandmother   ? Colon cancer Neg Hx   ? Esophageal cancer Neg Hx   ? Rectal cancer Neg Hx   ? Stomach cancer Neg Hx   ? ? ?Prior to Admission medications    ?Medication Sig Start Date End Date Taking? Authorizing Provider  ?acetaminophen (TYLENOL) 500 MG tablet Take 1 tablet (500 mg total) by mouth 3 (three) times daily as needed. 01/03/20   Gutierrez, Javier, MD  ?Ascorbic Acid (VITAMIN C) 100 MG tablet Take 100   mg by mouth daily.    [provider]  ?BD ULTRA-FINE LANCETS lancets Use as instructed upto 6 times daily 09/02/14   Phadke, Radhika P, MD  ?benzonatate (TESSALON) 100 MG capsule Take 1 capsule (100 mg total) by mouth 3 (three) times daily as needed for cough. 05/20/20   Gutierrez, Javier, MD  ?carvedilol (COREG) 3.125 MG tablet Take 1 tablet (3.125 mg total) by mouth 2 (two) times daily with a meal. 11/04/21   Gutierrez, Javier, MD  ?Cholecalciferol (VITAMIN D3) 25 MCG (1000 UT) CAPS Take 1 capsule (1,000 Units total) by mouth daily. 09/16/20   Gutierrez, Javier, MD  ?clopidogrel (PLAVIX) 75 MG tablet TAKE ONE TABLET BY MOUTH EVERY EVENING 07/15/21   Cain, Brandon Christopher, MD  ?COMFORT EZ PEN NEEDLES 32G X 4 MM MISC Use 4-5x a day 11/18/21   Gherghe, Cristina, MD  ?Continuous Blood Gluc Transmit (DEXCOM G6 TRANSMITTER) MISC 1 kit by Does not apply route See admin instructions. Use to check blood sugar 09/18/19   Gherghe, Cristina, MD  ?diclofenac Sodium (VOLTAREN) 1 % GEL Apply 2 g topically 3 (three) times daily. 03/15/20   Gutierrez, Javier, MD  ?furosemide (LASIX) 20 MG tablet Take 1 tablet (20 mg total) by mouth daily. 12/16/21 12/16/22  Smith, Dylan, MD  ?Glucagon, rDNA, (GLUCAGON EMERGENCY) 1 MG KIT INJECT into THE muscle ONCE AS NEEDED FOR emergency 11/18/21   Gherghe, Cristina, MD  ?Insulin Aspart FlexPen (NOVOLOG) 100 UNIT/ML USE UP TO 50 UNITS DAILY in insulin pump ?Patient taking differently: 10 - 12 units with each meal 05/20/21   Gherghe, Cristina, MD  ?losartan (COZAAR) 25 MG tablet TAKE ONE TABLET BY MOUTH ONCE DAILY 09/22/21   Gutierrez, Javier, MD  ?ONETOUCH ULTRA test strip USE TO check blood glucose FOUR TIMES DAILY 10/14/21   Gherghe,  Cristina, MD  ?predniSONE & diphenhydrAMINE 3 x 50 MG & 1 x 50 MG KIT Take by mouth as directed 12/14/21   Gutierrez, Javier, MD  ?rosuvastatin (CRESTOR) 20 MG tablet TAKE ONE TABLET BY MOUTH EVERY EVENING 09/22/21   Gu

## 2021-12-22 NOTE — ED Triage Notes (Signed)
Pt comes via EMS from home with c/o SOB upon exertion. Pt states she was advised to come per MD. Pt has hx of CHF. EMS reports CBG-559, pt did take 47 units of novolog at home prior. ? ?20 g in l forearm, 121/72 ?

## 2021-12-22 NOTE — ED Notes (Signed)
Patient transported to [facility], report given to [place], Therapist, sports ?

## 2021-12-22 NOTE — Telephone Encounter (Signed)
Following up on this to see what next steps are.  ?

## 2021-12-23 ENCOUNTER — Observation Stay: Admit: 2021-12-23 | Payer: HMO

## 2021-12-23 ENCOUNTER — Telehealth: Payer: Self-pay | Admitting: Family Medicine

## 2021-12-23 ENCOUNTER — Observation Stay: Payer: HMO

## 2021-12-23 DIAGNOSIS — R0602 Shortness of breath: Secondary | ICD-10-CM | POA: Diagnosis not present

## 2021-12-23 DIAGNOSIS — R079 Chest pain, unspecified: Secondary | ICD-10-CM | POA: Diagnosis not present

## 2021-12-23 LAB — CBC
HCT: 40.2 % (ref 36.0–46.0)
Hemoglobin: 12.8 g/dL (ref 12.0–15.0)
MCH: 26.1 pg (ref 26.0–34.0)
MCHC: 31.8 g/dL (ref 30.0–36.0)
MCV: 81.9 fL (ref 80.0–100.0)
Platelets: 202 10*3/uL (ref 150–400)
RBC: 4.91 MIL/uL (ref 3.87–5.11)
RDW: 14.3 % (ref 11.5–15.5)
WBC: 8.4 10*3/uL (ref 4.0–10.5)
nRBC: 0 % (ref 0.0–0.2)

## 2021-12-23 LAB — GLUCOSE, CAPILLARY
Glucose-Capillary: 171 mg/dL — ABNORMAL HIGH (ref 70–99)
Glucose-Capillary: 139 mg/dL — ABNORMAL HIGH (ref 70–99)
Glucose-Capillary: 395 mg/dL — ABNORMAL HIGH (ref 70–99)
Glucose-Capillary: 389 mg/dL — ABNORMAL HIGH (ref 70–99)
Glucose-Capillary: 301 mg/dL — ABNORMAL HIGH (ref 70–99)

## 2021-12-23 LAB — BASIC METABOLIC PANEL
Anion gap: 7 (ref 5–15)
BUN: 13 mg/dL (ref 8–23)
CO2: 26 mmol/L (ref 22–32)
Calcium: 8.7 mg/dL — ABNORMAL LOW (ref 8.9–10.3)
Chloride: 106 mmol/L (ref 98–111)
Creatinine, Ser: 0.88 mg/dL (ref 0.44–1.00)
GFR, Estimated: >60 mL/min (ref >60)
Glucose, Bld: 190 mg/dL — ABNORMAL HIGH (ref 70–99)
Potassium: 3.3 mmol/L — ABNORMAL LOW (ref 3.5–5.1)
Sodium: 139 mmol/L (ref 135–145)

## 2021-12-23 LAB — D-DIMER, QUANTITATIVE: D-Dimer, Quant: 0.54 ug/mL-FEU — ABNORMAL HIGH (ref 0.00–0.50)

## 2021-12-23 MED ORDER — DIPHENHYDRAMINE HCL 50 MG/ML IJ SOLN
50.0000 mg | Freq: Once | INTRAMUSCULAR | Status: AC
  Administered 2021-12-23: 50 mg via INTRAVENOUS
  Filled 2021-12-23: qty 1

## 2021-12-23 MED ORDER — HYDROCORTISONE SOD SUC (PF) 250 MG IJ SOLR
200.0000 mg | Freq: Once | INTRAMUSCULAR | Status: AC
  Administered 2021-12-23: 200 mg via INTRAVENOUS
  Filled 2021-12-23: qty 200

## 2021-12-23 MED ORDER — ADULT MULTIVITAMIN W/MINERALS CH
1.0000 | ORAL_TABLET | Freq: Every day | ORAL | Status: DC
  Administered 2021-12-23 – 2021-12-24 (×2): 1 via ORAL
  Filled 2021-12-23 (×2): qty 1

## 2021-12-23 MED ORDER — INSULIN GLARGINE-YFGN 100 UNIT/ML ~~LOC~~ SOLN
46.0000 [IU] | Freq: Every day | SUBCUTANEOUS | Status: DC
  Administered 2021-12-23 – 2021-12-24 (×2): 46 [IU] via SUBCUTANEOUS
  Filled 2021-12-23 (×2): qty 0.46

## 2021-12-23 MED ORDER — DIPHENHYDRAMINE HCL 25 MG PO CAPS: 50.0000 mg | ORAL_CAPSULE | Freq: Once | ORAL | Status: AC

## 2021-12-23 MED ORDER — MELATONIN 5 MG PO TABS
5.0000 mg | ORAL_TABLET | Freq: Every day | ORAL | Status: DC
  Administered 2021-12-23: 5 mg via ORAL
  Filled 2021-12-23: qty 1

## 2021-12-23 MED ORDER — BISACODYL 10 MG RE SUPP
10.0000 mg | Freq: Every day | RECTAL | Status: DC | PRN
  Administered 2021-12-23: 10 mg via RECTAL
  Filled 2021-12-23: qty 1

## 2021-12-23 MED ORDER — IOHEXOL 350 MG/ML SOLN
75.0000 mL | Freq: Once | INTRAVENOUS | Status: AC | PRN
  Administered 2021-12-23: 75 mL via INTRAVENOUS

## 2021-12-23 MED ORDER — BUDESONIDE 0.25 MG/2ML IN SUSP
0.2500 mg | Freq: Two times a day (BID) | RESPIRATORY_TRACT | Status: DC
  Administered 2021-12-23 – 2021-12-24 (×3): 0.25 mg via RESPIRATORY_TRACT
  Filled 2021-12-23 (×3): qty 2

## 2021-12-23 MED ORDER — HYDROCORTISONE SOD SUC (PF) 250 MG IJ SOLR
200.0000 mg | Freq: Once | INTRAMUSCULAR | Status: DC
  Filled 2021-12-23: qty 200

## 2021-12-23 NOTE — Evaluation (Signed)
Physical Therapy Evaluation ?Patient Details ?Name: Cassandra Benson ?MRN: [id number] ?DOB: 1941/04/17 ?Today's Date: 12/23/2021 ? ?History of Present Illness ? 81 yo female with a PMHx significant for HTN, HLD, hypothyroidism, CAD, LADA, HA's, fibromyalgia, PAD, Sjogren's syndrome, CHF, and history of COVID-19 (12/22), presenting to [facility] ED on 3/29 with complains of shortness of breath this been going on for past few days. Also came to [facility] ED on 3/23 with SOB and R>L LE swelling. ? ?  ?Clinical Impression ? Pt received in supine position and agreeable to therapy, sister present in room upon arrival, but leaving.  Pt perseverates on imaging about to be done and not wanting to participate in therapy due to them coming soon.  Pt encouraged that they would not leave her without getting her imaging performed.  Pt agreed to performing bed mobility and transfer training with hopes of ambulating.  Pt performed well with all tasks involved, with difficulty noted in ambulating around the nursing station.  Pt does have a tendency to speak too often when her breathing is challenged.  Pt educated on pursed lip breathing, but due to perseverating on other tasks, demonstrated difficulty performing the breathing technique.  Pt required one standing rest break and was able to transfer back to bed prior to imaging arrival.  Pt requires consistent verbal cueing for direction in order for pt to not get lost in conversation.  Current discharge plans to home with HHPT are appropriate at this time, as long as breathing difficulties are addressed and managed.  Pt will continue to benefit from skilled therapy in order to address deficits listed below. ? ?   ? ?Recommendations for follow up therapy are one component of a multi-disciplinary discharge planning process, led by the attending physician.  Recommendations may be updated based on patient status, additional functional criteria and insurance authorization. ? ?Follow Up  Recommendations Home health PT ? ?  ?Assistance Recommended at Discharge Intermittent Supervision/Assistance  ?Patient can return home with the following ?   ? ?  ?Equipment Recommendations Rolling walker (2 wheels)  ?Recommendations for Other Services ?    ?  ?Functional Status Assessment Patient has had a recent decline in their functional status and demonstrates the ability to make significant improvements in function in a reasonable and predictable amount of time.  ? ?  ?Precautions / Restrictions Precautions ?Precautions: Fall ?Restrictions ?Weight Bearing Restrictions: No  ? ?  ? ?Mobility ? Bed Mobility ?Overal bed mobility: Needs Assistance ?Bed Mobility: Supine to Sit, Sit to Supine ?  ?  ?Supine to sit: Modified independent (Device/Increase time) ?Sit to supine: Modified independent (Device/Increase time) ?  ?General bed mobility comments: increased time/effort, increased SOB noted, vitals stable ?Patient Response: Anxious ? ?Transfers ?Overall transfer level: Needs assistance ?Equipment used: Rolling walker (2 wheels) ?Transfers: Sit to/from Stand ?Sit to Stand: Min guard ?  ?  ?  ?  ?  ?General transfer comment: slow, cautious ?  ? ?Ambulation/Gait ?Ambulation/Gait assistance: Min guard ?Gait Distance (Feet): 160 Feet ?Assistive device: Rolling walker (2 wheels) ?Gait Pattern/deviations: Step-through pattern, Decreased stride length ?Gait velocity: decreased ?  ?  ?General Gait Details: slow cautious gait, talks a lot which causes her to become SOB.  Unable to stop talking to allow for pursed lip breathing technique to be applied.  standing rest break utilized once when ambulating for therapeutic rest. ? ?Stairs ?  ?  ?  ?  ?  ? ?Wheelchair Mobility ?  ? ?Modified Rankin (Stroke Patients  Only) ?  ? ?  ? ?Balance Overall balance assessment: Needs assistance, History of Falls ?  ?  ?  ?  ?  ?  ?  ?  ?  ?  ?  ?  ?  ?  ?  ?  ?  ?  ?   ? ? ? ?Pertinent Vitals/Pain    ? ? ?Home Living Family/patient expects to  be discharged to:: Private residence ?Living Arrangements: Alone ?Available Help at Discharge: Family;Friend(s);Available PRN/intermittently ?Type of Home: House ?Home Access: Stairs to enter ?Entrance Stairs-Rails: None ?Entrance Stairs-Number of Steps: threshold from garage, 1 step + threshold from front ?  ?Home Layout: One level ?Home Equipment: Shower seat;Tub bench;Cane - single point;BSC/3in1;Hand held shower head ?Additional Comments: uses tub bench PRN when she wants to soak her feet in the tub, otherwise uses shower chair for seated showers  ?  ?Prior Function Prior Level of Function : Independent/Modified Independent;History of Falls (last six months);Driving ?  ?  ?  ?  ?  ?  ?Mobility Comments: no AD, becomes SOB with in room distances and has been having SOB for at least 4 wks but has been worse lately ?ADLs Comments: Pt mod indep with ADL, sits for bathing, takes her time 2/2 SOB, family brings meals on Sundays, but does some light meal prep herself, short distance daytime driving, uses pill organizer, and endorses 2 falls in past 6mo Used to enjoy walking with her friends but hasn't been able to tolerate this in the past 1.5 years ?  ? ? ?Hand Dominance  ? Dominant Hand: Right ? ?  ?Extremity/Trunk Assessment  ? Upper Extremity Assessment ?Upper Extremity Assessment: Generalized weakness;Overall [facility] tasks assessed ?  ? ?Lower Extremity Assessment ?Lower Extremity Assessment: Defer to PT evaluation;Overall [facility] tasks assessed;Generalized weakness ?  ? ?Cervical / Trunk Assessment ?Cervical / Trunk Assessment: Normal  ?Communication  ? Communication: No difficulties  ?Cognition Arousal/Alertness: Awake/alert ?Behavior During Therapy: [facility] assessed/performed ?Overall Cognitive Status: Within Functional Limits for tasks assessed ?  ?  ?  ?  ?  ?  ?  ?  ?  ?  ?  ?  ?  ?  ?  ?  ?General Comments: Pt alert and oriented,  perseverates a bit, requiring VC to redirect to tasks ?  ?  ? ?   ?General Comments   ? ?  ?Exercises    ? ?Assessment/Plan  ?  ?PT Assessment Patient needs continued PT services  ?PT Problem List Decreased strength;Decreased activity tolerance;Decreased balance;Decreased mobility;Decreased safety awareness ? ?   ?  ?PT Treatment Interventions DME instruction;Gait training;Functional mobility training;Therapeutic activities;Therapeutic exercise;Balance training;Neuromuscular re-education   ? ?PT Goals (Current goals can be found in the Care Plan section)  ?Acute Rehab PT Goals ?Patient Stated Goal: to get better ?PT Goal Formulation: With patient ?Time For Goal Achievement: 01/06/22 ?Potential to Achieve Goals: Good ? ?  ?Frequency Min 2X/week ?  ? ? ?Co-evaluation   ?  ?  ?  ?  ? ? ?  ?AM-PAC PT "6 Clicks" Mobility  ?Outcome Measure Help needed turning from your back to your side while in a flat bed without using bedrails?: A Little ?Help needed moving from lying on your back to sitting on the side of a flat bed without using bedrails?: A Little ?Help needed moving to and from a bed to a chair (including a wheelchair)?: A Little ?Help needed standing up from a chair using your  arms (e.g., wheelchair or bedside chair)?: A Little ?Help needed to walk in hospital room?: A Lot ?Help needed climbing 3-5 steps with a railing? : A Lot ?6 Click Score: 16 ? ?  ?End of Session Equipment Utilized During Treatment: Gait belt ?Activity Tolerance: Patient tolerated treatment well ?Patient left: in bed;with call bell/phone within reach;with bed alarm set ?Nurse Communication: Mobility status ?PT Visit Diagnosis: Unsteadiness on feet (R26.81);Other abnormalities of gait and mobility (R26.89);Muscle weakness (generalized) (M62.81);History of falling (Z91.81);Difficulty in walking, not elsewhere classified (R26.2) ?  ? ?Time: 1740-8144 ?PT Time Calculation (min) (ACUTE ONLY): 21 min ? ? ?Charges:   PT Evaluation ?$PT Eval Low Complexity: 1 Low ?PT Treatments ?$Therapeutic Exercise: 8-22 mins ?  ?    ? ? ?Gwenlyn Saran, PT, DPT ?12/23/21, 2:20 PM ? ? ?Christie Nottingham ?12/23/2021, 2:14 PM ? ?

## 2021-12-23 NOTE — Progress Notes (Signed)
Initial Nutrition Assessment ? ?DOCUMENTATION CODES:  ?Not applicable ? ?INTERVENTION:  ?Adjust diet to carb modified with 2g Na restriction to better meet nutrition needs ?MVI with minerals daily ? ?NUTRITION DIAGNOSIS:  ?Increased nutrient needs related to chronic illness (CHF) as evidenced by estimated needs. ? ?GOAL:  ?Patient will meet greater than or equal to 90% of their needs ? ?MONITOR:  ?PO intake, I & O's, Weight trends ? ?REASON FOR ASSESSMENT:  ?Consult ? ("nutrition goals") ? ?ASSESSMENT:  ?81 y.o. female with a history of HTN, HLD, CHF, CAD, PVD, diverticulitis, vitamin D deficiency, GERD, DM type 1 presented to ED with worsening SOB. ? ?Found to have a CHF exacerbation on admission. Pt undergoing dieresis.  ? ?Attempted to call pt on room phone, busy signal at this time. No weight loss noted in hx and no meals have been recorded in flowsheet.  ? ?Pt's CBGs quite elevated with high A1c. Will adjust diet order to carb modified with 2g sodium restriction to better meet nutrition needs at this time.  ? ?Nutritionally Relevant Medications: ?Scheduled Meds: ? furosemide  20 mg Intravenous BID  ? insulin aspart  0-15 Units Subcutaneous TID WC  ? insulin aspart  0-5 Units Subcutaneous QHS  ? insulin glargine-yfgn  46 Units Subcutaneous Daily  ? levothyroxine  75 mcg Oral Q0600  ? rosuvastatin  20 mg Oral QPM  ? ?PRN Meds: ondansetron ? ?Labs Reviewed: ?Potassium 3.3 ?CBG ranges from 62-395 mg/dL over the last 24 hours ?HgbA1c 10.3% (11/09/21) ? ?NUTRITION - FOCUSED PHYSICAL EXAM: ?Defer to in-person assessment ? ?Diet Order:   ?Diet Order   ? ?       ?  Diet Heart Room service appropriate? Yes; Fluid consistency: Thin; Fluid restriction: 1500 mL Fluid  Diet effective now       ?  ? ?  ?  ? ?  ? ? ?EDUCATION NEEDS:  ?No education needs have been identified at this time ? ?Skin:  Skin Assessment: Reviewed RN Assessment ? ?Last BM:  unsure ? ?Height:  ?Ht Readings from Last 1 Encounters:  ?12/23/21 '5\' 3"'$  (1.6 m)   ? ?Weight:  ?Wt Readings from Last 1 Encounters:  ?12/23/21 69.5 kg  ? ?Ideal Body Weight:  52.3 kg ? ?BMI:  Body mass index is 27.16 kg/m?. ? ?Estimated Nutritional Needs:  ?Kcal:  1500-1700 kcal/d ?Protein:  75-90 g/d ?Fluid:  1.5-1.8 L/d ? ? ?Ranell Patrick, RD, LDN ?Clinical Dietitian ?RD pager # available in [place]  ?After hours/weekend pager # available in [place] ?

## 2021-12-23 NOTE — TOC CM/SW Note (Signed)
CSW acknowledges consult for heart failure screen. Heart Failure Nurse Navigator is aware. ? ?Dayton Scrape, [place] ?[phone] ? ?

## 2021-12-23 NOTE — Assessment & Plan Note (Signed)
CPAP per home settings.   

## 2021-12-23 NOTE — Progress Notes (Addendum)
Inpatient Diabetes Program Recommendations ? ?AACE/ADA: New Consensus Statement on Inpatient Glycemic Control (2015) ? ?Target Ranges:  Prepandial:   less than 140 mg/dL ?     Peak postprandial:   less than 180 mg/dL (1-2 hours) ?     Critically ill patients:  140 - 180 mg/dL  ? ? Latest Reference Range & Units 12/22/21 17:00 12/22/21 22:21 12/23/21 02:56  ?Glucose-Capillary 70 - 99 mg/dL 62 (L) 283 (H) ? ?3 units Novolog ? 171 (H)  ? ? ? ?Admit with: Shortness of breath attributed to mild CHF exacerbation ? ?History: LADA (treated as Type 1 Diabetes) ? ?Home DM Meds: Tresiba 46 units Daily ?       Novolog 1 unit for every 15 grams Carbohydrates ?       Novolog 1 unit for every 20 mg/dl >Target CBG of 130 mg/dl ? ?Current Orders: Novolog Moderate Correction Scale/ SSI (0-15 units) TID AC + HS ? ? ? ?Endocrinologist: Dr. Cruzita Lederer ?Last seen in office 08/02/2021 ? ? ? ?MD- Note patient sees ENDO Dr. Cruzita Lederer for her diabetes needs.  Has LADA per Dr. Arman Filter notes and is treated as Type 1 diabetic.  Will need Basal Insulin and Novolog Meal Coverage added to inpatient regimen.  Can start conservatively with the Basal Insulin if needed since pt had mild Hypoglycemia yesterday afternoon. ? ?Please consider: ? ?1. Start Semglee 35 units daily (please start this AM) ?This would be 80% home dose of basal insulin ? ?2. Continue Novolog 0-15 units TID for SSI coverage ? ?3. Start Novolog Meal Coverage: Novolog 4 units TID with meals ?Hold if pt eats <50% meals ? ? ? ? ?--Will follow patient during hospitalization-- ? ?Wyn Quaker RN, MSN, CDE ?Diabetes Coordinator ?Inpatient Glycemic Control Team ?Team Pager: [phone] (8a-5p) ? ? ? ? ? ?

## 2021-12-23 NOTE — Assessment & Plan Note (Addendum)
--  continue patient on levothyroxine 75 mcg. ? ?

## 2021-12-23 NOTE — Assessment & Plan Note (Addendum)
--  No hypoxia. ?--CTA chest performed after pre-medication, and was neg for PE, PNA, pulm edema or any abnormal lung findings.  Pt has no hx of COPD or smoking.  No hx of pulm HTN or valvular disease.  No cough or congestion to suggest acute viral illness.  Not in DKA.   ?Plan: ?--Echo ?--RVP ? ? ? ?

## 2021-12-23 NOTE — Assessment & Plan Note (Addendum)
--  on Tresiba 46u daily and SSI with mealtime ?--resume long-acting as glargine 46u daily ?--SSI ? ?

## 2021-12-23 NOTE — Assessment & Plan Note (Addendum)
--  home losartan held on admisison ?--cont home coreg, lasix ? ?

## 2021-12-23 NOTE — Progress Notes (Signed)
?  Progress Note ? ? ?Patient: Cassandra Benson WCB:[id number] DOB: 1941-08-04 DOA: 12/22/2021     0 ?DOS: the patient was seen and examined on 12/23/2021 ?  ?Brief hospital course: ?No notes on file ? ?Assessment and Plan: ?* SOB (shortness of breath) ?--No hypoxia. ?--CTA chest performed after pre-medication, and was neg for PE, PNA, pulm edema or any abnormal lung findings.  Pt has no hx of COPD or smoking.  No hx of pulm HTN or valvular disease.  No cough or congestion to suggest acute viral illness.  Not in DKA.   ?Plan: ?--Echo ?--RVP ? ? ? ? ?LADA (latent autoimmune diabetes in adults), managed as type 1 ([place]) ?--on Tresiba 46u daily and SSI with mealtime ?--resume long-acting as glargine 46u daily ?--SSI ? ? ?Apnea, sleep ?CPAP per home settings. ? ? ?Hypothyroidism due to Hashimoto's thyroiditis ?--continue patient on levothyroxine 75 mcg. ? ? ?Essential hypertension ?--home losartan held on admisison ?--cont home coreg, lasix ? ? ? ? ? ?  ? ?Subjective:  ?Discussed at length with pt and sister about workup done so far and the neg results, and trying to obtain further hx to pursue further testing to try to find out why pt has dyspnea, however, pt perseverated on how we didn't believe her symptom was real.   ? ?CTA PE study performed after pre-medication, and was neg for PE, PNA and pulm edema.   ? ? ?Physical Exam: ? ?Constitutional: NAD, AAOx3 ?HEENT: conjunctivae and lids normal, EOMI ?CV: No cyanosis.   ?RESP: normal respiratory effort, on RA ?Extremities: No effusions, edema in BLE ?SKIN: warm, dry ?Neuro: II - XII grossly intact.   ?Psych: irritable mood and affect.   ? ? ?Data Reviewed: ? ?Family Communication: sister updated at bedside today ? ?Disposition: ?Status is: Observation ? ? Planned Discharge Destination: Home ? ? ? ?Time spent: 50 minutes ? ?Author: ?Enzo Bi, MD ?12/23/2021 8:35 PM ? ?For on call review [email or web address].  ?

## 2021-12-23 NOTE — Progress Notes (Signed)
?   12/23/21 1726  ?Provider Notification  ?Provider Name/Title Dr. Billie Ruddy  ?Date Provider Notified 12/23/21  ?Time Provider Notified 1726  ?Method of Notification Page  ?Notification Reason Other (Comment) ?(loss of IV access. Unable to give IV lasix. Can we keep IV out and chage to PO lasix?)  ?Provider response See new orders;Other (Comment) ?(no current signs of CHF, Ok to not give this dose of IV lasix. Will await what ECHO shows. PO lasix to start in AM. May leave IV out.)  ?Date of Provider Response 12/23/21  ?Time of Provider Response 1726  ? ? ?

## 2021-12-23 NOTE — Telephone Encounter (Signed)
Thanks.  ? ?I see the notes.  ?

## 2021-12-23 NOTE — Evaluation (Signed)
Occupational Therapy Evaluation ?Patient Details ?Name: Cassandra Benson ?MRN: [id number] ?DOB: Jul 30, 1941 ?Today's Date: 12/23/2021 ? ? ?History of Present Illness 81 yo female with a PMHx significant for HTN, HLD, hypothyroidism, CAD, LADA, HA's, fibromyalgia, PAD, Sjogren's syndrome, CHF, and history of COVID-19 (12/22), presenting to [facility] ED on 3/29 with complains of shortness of breath this been going on for past few days. Also came to [facility] ED on 3/23 with SOB and R>L LE swelling.  ? ?Clinical Impression ?  ?Pt was seen for OT evaluation this date. Prior to hospital admission, pt was modified independent, living alone, but endorses increasing SOB over the past 4+ weeks, even with in-room distances. She does not use AD for mobility at baseline, drives short distances (daytime only), and family/friends check in and provide meals on occasion. Currently pt demonstrates impairments as described below (See OT problem list) which functionally limit her ability to perform ADL/self-care tasks. Pt currently requires PRN MIN A for LB ADL tasks, increased time/effort and CGA to complete step pivot transfers to/from the [facility], and endorses 9/10 perceived rate of exertion with toileting during session. Also endorses 9/10 headache with mobility. Pt/family educated in PLB to support breath recovery with exertion. Pt verbalizes understanding but further education limited as pt was upset by nursing issue previously discussed. Reassurance and active listening provided to support pt. Per pt's request, notified charge nurse. Pt would benefit from skilled OT services to address noted impairments and functional limitations (see below for any additional details) in order to maximize safety and independence while minimizing falls risk and caregiver burden. Upon hospital discharge, recommend HHOT to maximize pt safety and return to functional independence during meaningful occupations of daily life while incorporating energy  conservation strategies.    ? ?Recommendations for follow up therapy are one component of a multi-disciplinary discharge planning process, led by the attending physician.  Recommendations may be updated based on patient status, additional functional criteria and insurance authorization.  ? ?Follow Up Recommendations ? Home health OT  ?  ?Assistance Recommended at Discharge PRN  ?Patient can return home with the following A little help with bathing/dressing/bathroom;Assistance with cooking/housework;Assist for transportation;Help with stairs or ramp for entrance ? ?  ?Functional Status Assessment ? Patient has had a recent decline in their functional status and demonstrates the ability to make significant improvements in function in a reasonable and predictable amount of time.  ?Equipment Recommendations ? Other (comment) (reacher)  ?  ?Recommendations for Other Services   ? ? ?  ?Precautions / Restrictions Precautions ?Precautions: Fall ?Restrictions ?Weight Bearing Restrictions: No  ? ?  ? ?Mobility Bed Mobility ?Overal bed mobility: Needs Assistance ?Bed Mobility: Supine to Sit, Sit to Supine ?  ?  ?Supine to sit: Modified independent (Device/Increase time) ?Sit to supine: Modified independent (Device/Increase time) ?  ?General bed mobility comments: increased time/effort, increased SOB noted, vitals stable ?  ? ?Transfers ?Overall transfer level: Needs assistance ?Equipment used: None ?Transfers: Sit to/from Stand, Bed to chair/wheelchair/BSC ?Sit to Stand: Min guard ?  ?  ?Step pivot transfers: Min guard ?  ?  ?General transfer comment: slow, cautious ?  ? ?  ?Balance Overall balance assessment: Needs assistance, History of Falls ?Sitting-balance support: No upper extremity supported, Feet supported ?Sitting balance-Leahy Scale: Good ?  ?  ?Standing balance support: No upper extremity supported, During functional activity ?Standing balance-Leahy Scale: Fair ?  ?  ?  ?  ?  ?  ?  ?  ?  ?  ?  ?  ?   ? ?  ADL either  performed or assessed with clinical judgement  ? ?ADL   ?  ?  ?  ?  ?  ?  ?  ?  ?  ?  ?  ?  ?  ?  ?  ?  ?  ?  ?  ?General ADL Comments: Pt required supervision for lateral lean pericare after CGA transfer to the [facility]. Pt able to complete LB ADL tasks with PRN MIN A, appears SOB easily. VSS throughout, SpO2 at or above 98% throughout, HR in 70's at rest, up to 90's with exertion.  ? ? ? ?Vision   ?   ?   ?Perception   ?  ?Praxis   ?  ? ?Pertinent Vitals/Pain Pain Assessment ?Pain Assessment: 0-10 ?Pain Score: 9  ?Pain Location: headache that feels like "pins and needles going down the R side of my face" - RN notified ?Pain Descriptors / Indicators: Pins and needles ?Pain Intervention(s): Limited activity within patient's tolerance, Monitored during session, Patient requesting pain meds-RN notified  ? ? ? ?Hand Dominance Right ?  ?Extremity/Trunk Assessment Upper Extremity Assessment ?Upper Extremity Assessment: Generalized weakness;Overall [facility] for tasks assessed ?  ?Lower Extremity Assessment ?Lower Extremity Assessment: Defer to PT evaluation;Overall [facility] for tasks assessed;Generalized weakness ?  ?Cervical / Trunk Assessment ?Cervical / Trunk Assessment: Normal ?  ?Communication Communication ?Communication: No difficulties ?  ?Cognition Arousal/Alertness: Awake/alert ?Behavior During Therapy: [facility] for tasks assessed/performed ?Overall Cognitive Status: Within Functional Limits for tasks assessed ?  ?  ?  ?  ?  ?  ?  ?  ?  ?  ?  ?  ?  ?  ?  ?  ?General Comments: Pt alert and oriented, upset by nursing and perseverates a bit, requiring VC to redirect to tasks ?  ?  ?General Comments    ? ?  ?Exercises Other Exercises ?Other Exercises: Pt/family educated in PLB to support breath recovery with exertion. Pt verbalizes understanding but further education limited as pt was upset by nursing issue previously discussed. Reassurance and active listening provided to support pt. Per pt's request, notified charge nurse. ?   ?Shoulder Instructions    ? ? ?Home Living Family/patient expects to be discharged to:: Private residence ?Living Arrangements: Alone ?Available Help at Discharge: Family;Friend(s);Available PRN/intermittently ?Type of Home: House ?Home Access: Stairs to enter ?Entrance Stairs-Number of Steps: threshold from garage, 1 step + threshold from front ?Entrance Stairs-Rails: None ?Home Layout: One level ?  ?  ?Bathroom Shower/Tub: Tub/shower unit;Walk-in shower ?  ?Bathroom Toilet: Handicapped height ?  ?  ?Home Equipment: Shower seat;Tub bench;Cane - single point;BSC/3in1;Hand held shower head ?  ?Additional Comments: uses tub bench PRN when she wants to soak her feet in the tub, otherwise uses shower chair for seated showers ?  ? ?  ?Prior Functioning/Environment Prior Level of Function : Independent/Modified Independent;History of Falls (last six months);Driving ?  ?  ?  ?  ?  ?  ?Mobility Comments: no AD, becomes SOB with in room distances and has been having SOB for at least 4 wks but has been worse lately ?ADLs Comments: Pt mod indep with ADL, sits for bathing, takes her time 2/2 SOB, family brings meals on Sundays, but does some light meal prep herself, short distance daytime driving, uses pill organizer, and endorses 2 falls in past 90mo Used to enjoy walking with her friends but hasn't been able to tolerate this in the past 1.5 years ?  ? ?  ?  ?  OT Problem List: Decreased strength;Pain;Decreased activity tolerance;Impaired balance (sitting and/or standing);Decreased knowledge of use of DME or AE ?  ?   ?OT Treatment/Interventions: Self-care/ADL training;Therapeutic exercise;Therapeutic activities;DME and/or AE instruction;Energy conservation;Balance training;Patient/family education  ?  ?OT Goals(Current goals can be found in the care plan section) Acute Rehab OT Goals ?Patient Stated Goal: breathe better and be more independent ?OT Goal Formulation: With patient/family ?Time For Goal Achievement:  01/06/22 ?Potential to Achieve Goals: Good ?ADL Goals ?Pt Will Perform Lower Body Dressing: with modified independence;sit to/from stand ?Pt Will Transfer to Toilet: with modified independence;ambulating (LRAD PRN) ?Additional ADL Go

## 2021-12-23 NOTE — Telephone Encounter (Signed)
Possibly not.  I just fwd Dr. Darnell Level a phn note from pt that she is at [facility] and is getting a scan.  Then I checked her chart and pt was admitted yesterday.  ?

## 2021-12-23 NOTE — Progress Notes (Signed)
Patient with a history of a contrast allergy was pre-medicated with benadryl and solu-medrol for today's CT Angio for PE evaluation. Shortly after the contrast was administered the patient complained of chest tightness and Dr. Annamaria Boots (IR) was asked to evaluate the patient in CT. Dr. Annamaria Boots and I examined her and the patient stated the feeling in her chest had eased. She denied any difficulty breathing or swallowing, no hives/rash observed and the patient denied pruritis. ? ?No new orders placed.  ? ?Patient to return to the floor. If patient experiences any symptoms that seem related to contrast allergy (pruritis, rash/hives, difficulty breathing or swallowing) please notify primary team.  ? Soyla Dryer, AGACNP-BC ?[phone] ?12/23/2021, 1:39 PM ? ?  ?

## 2021-12-23 NOTE — Telephone Encounter (Signed)
Pt called in to make PCP aware that she is in [facility]  in patient and is having body scan done also concerns about her oxygen # [phone]  ?

## 2021-12-23 NOTE — Telephone Encounter (Signed)
Pt has an OV with Dr. Darnell Level tomorrow at 12:30. ?

## 2021-12-24 ENCOUNTER — Observation Stay (HOSPITAL_BASED_OUTPATIENT_CLINIC_OR_DEPARTMENT_OTHER)
Admit: 2021-12-24 | Discharge: 2021-12-24 | Disposition: A | Discharge status: Home / Self Care | Payer: HMO | Attending: Medical | Admitting: Medical

## 2021-12-24 ENCOUNTER — Ambulatory Visit: Payer: PPO | Admitting: Family Medicine

## 2021-12-24 ENCOUNTER — Observation Stay: Admit: 2021-12-24 | Payer: HMO

## 2021-12-24 DIAGNOSIS — R0609 Other forms of dyspnea: Secondary | ICD-10-CM | POA: Diagnosis not present

## 2021-12-24 DIAGNOSIS — R0602 Shortness of breath: Secondary | ICD-10-CM | POA: Diagnosis not present

## 2021-12-24 LAB — BASIC METABOLIC PANEL
Anion gap: 10 (ref 5–15)
BUN: 21 mg/dL (ref 8–23)
CO2: 23 mmol/L (ref 22–32)
Calcium: 9.1 mg/dL (ref 8.9–10.3)
Chloride: 105 mmol/L (ref 98–111)
Creatinine, Ser: 0.86 mg/dL (ref 0.44–1.00)
GFR, Estimated: >60 mL/min (ref >60)
Glucose, Bld: 185 mg/dL — ABNORMAL HIGH (ref 70–99)
Potassium: 2.9 mmol/L — ABNORMAL LOW (ref 3.5–5.1)
Sodium: 138 mmol/L (ref 135–145)

## 2021-12-24 LAB — CBC
HCT: 39.1 % (ref 36.0–46.0)
Hemoglobin: 12.9 g/dL (ref 12.0–15.0)
MCH: 27 pg (ref 26.0–34.0)
MCHC: 33 g/dL (ref 30.0–36.0)
MCV: 82 fL (ref 80.0–100.0)
Platelets: 210 10*3/uL (ref 150–400)
RBC: 4.77 MIL/uL (ref 3.87–5.11)
RDW: 14.3 % (ref 11.5–15.5)
WBC: 13.8 10*3/uL — ABNORMAL HIGH (ref 4.0–10.5)
nRBC: 0 % (ref 0.0–0.2)

## 2021-12-24 LAB — ECHOCARDIOGRAM COMPLETE
Area-P 1/2: 3.02 cm2
Height: 63 in
S' Lateral: 3.3 cm
Weight: 2452.8 oz

## 2021-12-24 LAB — RESPIRATORY PANEL BY PCR

## 2021-12-24 LAB — GLUCOSE, CAPILLARY
Glucose-Capillary: 144 mg/dL — ABNORMAL HIGH (ref 70–99)
Glucose-Capillary: 102 mg/dL — ABNORMAL HIGH (ref 70–99)

## 2021-12-24 LAB — MAGNESIUM: Magnesium: 2.2 mg/dL (ref 1.7–2.4)

## 2021-12-24 MED ORDER — ENSURE ENLIVE PO LIQD
237.0000 mL | Freq: Two times a day (BID) | ORAL | Status: DC
Start: 2021-12-25 — End: 2021-12-24

## 2021-12-24 MED ORDER — POTASSIUM CHLORIDE CRYS ER 20 MEQ PO TBCR
40.0000 meq | EXTENDED_RELEASE_TABLET | ORAL | Status: AC
  Administered 2021-12-24 (×2): 40 meq via ORAL
  Filled 2021-12-24 (×2): qty 2

## 2021-12-24 NOTE — TOC Initial Note (Signed)
Transition of Care (TOC) - Initial/Assessment Note  ? ? ?Patient Details  ?Name: Cassandra Benson ?MRN: [id number] ?Date of Birth: 10-Sep-1941 ? ?Transition of Care (TOC) CM/SW Contact:    ?Alberteen Sam, LCSW ?Phone Number: ?12/24/2021, 12:04 PM ? ?Clinical Narrative:                 ? ?CSW met with patient at bedside to review home health recommendations and any dme needs.  ? ?Patient reports she is usually independent at home, does not use any dme or walker.  ? ?Patient is guarded and reports this is all "daunting". States she wishes to not talk about home health at this time and to re address needs right before her discharge home.  ? ?TOC will follow for needs when patient is agreeable to talk.  ? ? ?Expected Discharge Plan:  (TBD) ?Barriers to Discharge: Continued Medical Work up ? ? ?Patient Goals and CMS Choice ?Patient states their goals for this hospitalization and ongoing recovery are:: to go home ?CMS Medicare.gov Compare Post Acute Care list provided to:: Patient ?Choice offered to / list presented to : Patient ? ?Expected Discharge Plan and Services ?Expected Discharge Plan:  (TBD) ?  ?  ?  ?  ?                ?  ?  ?  ?  ?  ?  ?  ?  ?  ?  ? ?Prior Living Arrangements/Services ?  ?Lives with:: Self ?  ?       ?  ?  ?  ?  ? ?Activities of Daily Living ?  ?  ? ?Permission Sought/Granted ?  ?  ?   ?   ?   ?   ? ?Emotional Assessment ?Appearance:: Appears stated age ?Attitude/Demeanor/Rapport: Guarded ?  ?Orientation: : Oriented to Self, Oriented to Place, Oriented to  Time, Oriented to Situation ?Alcohol / Substance Use: Not Applicable ?Psych Involvement: No (comment) ? ?Admission diagnosis:  SOB (shortness of breath) [R06.02] ?DOE (dyspnea on exertion) [R06.09] ?Hypoxia [R09.02] ?Chronic congestive heart failure, unspecified heart failure type ([place]) [I50.9] ?Patient Active Problem List  ? Diagnosis Date Noted  ? SOB (shortness of breath) 12/22/2021  ? Dyspnea on exertion 12/13/2021  ? Sigmoid  diverticulitis 05/26/2021  ? Unsteadiness 02/13/2021  ? Atherosclerosis of aorta ([place]) 12/16/2020  ? Neck pain 12/03/2020  ? Cause of injury, fall 09/01/2020  ? Grade II diastolic dysfunction 10/04/3233  ? Chronic venous insufficiency 04/07/2020  ? Bowel habit changes 02/18/2020  ? Diverticulosis   ? Partial small bowel obstruction ([place]) 01/15/2020  ? Epistaxis 01/13/2020  ? Cognitive changes 10/22/2019  ? Right sided abdominal pain 10/07/2019  ? Skin lesion 09/14/2019  ? Renal insufficiency 09/14/2019  ? Pain in right buttock 06/18/2019  ? Chronic midline thoracic back pain 05/14/2019  ? Sjogren's syndrome without extraglandular involvement ([place]) 02/26/2019  ? Epigastric discomfort 12/17/2018  ? PAD (peripheral artery disease) ([place]) 10/04/2018  ? Raynaud disease 09/04/2018  ? Amaurosis fugax, both eyes 06/18/2018  ? TIA (transient ischemic attack) 05/21/2018  ? 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 Facial paresthesia 02/07/2018  ? Pain and swelling of lower leg 12/25/2017  ? Fibromyalgia   ? 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 Intractable episodic headache 03/13/2017  ? Numbness and tingling of both lower extremities 03/13/2017  ? 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 Fatty liver 02/13/2017  ? LADA (latent autoimmune diabetes in adults), managed as  type 1 ([place]) 10/03/2016  ? Medicare annual wellness visit, subsequent 06/19/2015  ? Health maintenance examination 06/19/2015  ? Advanced care planning/counseling discussion 06/19/2015  ? 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 Vitamin D deficiency   ? Family history of premature CAD 06/02/2015  ? Chest pain 05/26/2015  ? Elevated testosterone level in female 09/04/2014  ? Hyperlipidemia associated with type 2 diabetes mellitus ([place]) 07/30/2014  ? Essential hypertension 07/17/2014  ? Hypothyroidism due to Hashimoto's thyroiditis 07/17/2014  ? Apnea, sleep 08/22/2012  ? LBP (low back pain) 02/27/2012  ? Positive  ANA (antinuclear antibody) 01/06/2012  ? ?PCP:  [name], MD ?Pharmacy:   ?[facility] - [place], [place] - [address] ?[address] ?[place] [address] ?Phone: [phone] Fax: [phone] ? ?[id number] - [address], [place] ?[place] ?[place] [address] ?Phone: [phone] Fax: [phone] ? ? ? ? ?Social Determinants of Health (SDOH) Interventions ?  ? ?Readmission Risk Interventions ?   ? View : No data to display.  ?  ?  ?  ? ? ? ?

## 2021-12-24 NOTE — Progress Notes (Signed)
Occupational Therapy Treatment ?Patient Details ?Name: Cassandra Benson ?MRN: [id number] ?DOB: 04-May-1941 ?Today's Date: 12/24/2021 ? ? ?History of present illness 81 yo female with a PMHx significant for HTN, HLD, hypothyroidism, CAD, LADA, HA's, fibromyalgia, PAD, Sjogren's syndrome, CHF, and history of COVID-19 (12/22), presenting to [facility] ED on 3/29 with complains of shortness of breath this been going on for past few days. Also came to [facility] ED on 3/23 with SOB and R>L LE swelling. ?  ?OT comments ? Pt seen for OT tx. Pt sleeping, wakes easily to OT's voice, and agreeable to session. Pt endorses significant middle back pain that she reports is not new and not related to her admission, reporting 7/10. Pt noted to be scooted down in bed. Pt educated in bed positioning to improve comfort and pt willing to try. Pt also eager to wash her hair. Set up while seated EOB to wash her hair with supervision. Pt did not appear or mention feeling SOB at all this date. Pt returned to bed per her request endorsing fatigue. RN in to address pt concerns. Continue to recommend [place] for pt at discharge.   ? ?Recommendations for follow up therapy are one component of a multi-disciplinary discharge planning process, led by the attending physician.  Recommendations may be updated based on patient status, additional functional criteria and insurance authorization. ?   ?Follow Up Recommendations ? Home health OT  ?  ?Assistance Recommended at Discharge PRN  ?Patient can return home with the following ? A little help with bathing/dressing/bathroom;Assistance with cooking/housework;Assist for transportation;Help with stairs or ramp for entrance;A little help with walking and/or transfers ?  ?Equipment Recommendations ? Other (comment) (reacher)  ?  ?Recommendations for Other Services   ? ?  ?Precautions / Restrictions Precautions ?Precautions: Fall ?Restrictions ?Weight Bearing Restrictions: No  ? ? ?  ? ?Mobility Bed Mobility ?Overal  bed mobility: Needs Assistance ?Bed Mobility: Supine to Sit, Sit to Supine ?  ?  ?Supine to sit: Modified independent (Device/Increase time) ?Sit to supine: Modified independent (Device/Increase time) ?  ?General bed mobility comments: no SOB noted ?  ? ?Transfers ?Overall transfer level: Needs assistance ?Equipment used: Rolling walker (2 wheels) ?Transfers: Sit to/from Stand ?Sit to Stand: Supervision ?  ?  ?  ?  ?  ?General transfer comment: supervision from std height bed, took a few steps EOB to improve positioning prior to return to bed per pt's request ?  ?  ?Balance Overall balance assessment: Needs assistance, History of Falls ?Sitting-balance support: No upper extremity supported, Feet supported ?Sitting balance-Leahy Scale: Good ?  ?  ?Standing balance support: During functional activity, Bilateral upper extremity supported ?Standing balance-Leahy Scale: Good ?  ?  ?  ?  ?  ?  ?  ?  ?  ?  ?  ?  ?   ? ?ADL either performed or assessed with clinical judgement  ? ?ADL   ?  ?  ?  ?  ?  ?  ?  ?  ?  ?  ?  ?  ?  ?  ?  ?  ?  ?  ?  ?General ADL Comments: Pt sat EOB to wash her hair with set up and supervision. ?  ? ?Extremity/Trunk Assessment   ?  ?  ?  ?  ?  ? ?Vision   ?  ?  ?Perception   ?  ?Praxis   ?  ? ?Cognition Arousal/Alertness: Awake/alert ?Behavior During Therapy: [facility] for tasks assessed/performed ?  Overall Cognitive Status: Within Functional Limits for tasks assessed ?  ?  ?  ?  ?  ?  ?  ?  ?  ?  ?  ?  ?  ?  ?  ?  ?  ?  ?  ?   ?Exercises   ? ?  ?Shoulder Instructions   ? ? ?  ?General Comments no SOB noted  ? ? ?Pertinent Vitals/ Pain       Pain Assessment ?Pain Assessment: 0-10 ?Pain Score: 7  ?Pain Location: middle back between shoulder blades (chronic?) ?Pain Intervention(s): Limited activity within patient's tolerance, Monitored during session, Repositioned ? ?Home Living   ?  ?  ?  ?  ?  ?  ?  ?  ?  ?  ?  ?  ?  ?  ?  ?  ?  ?  ? ?  ?Prior Functioning/Environment    ?  ?  ?  ?   ? ?Frequency ? Min  2X/week  ? ? ? ? ?  ?Progress Toward Goals ? ?OT Goals(current goals can now be found in the care plan section) ? Progress towards OT goals: Progressing toward goals ? ?Acute Rehab OT Goals ?Patient Stated Goal: breathe better and be more independent ?OT Goal Formulation: With patient/family ?Time For Goal Achievement: 01/06/22 ?Potential to Achieve Goals: Good  ?Plan Discharge plan remains appropriate;Frequency remains appropriate   ? ?Co-evaluation ? ? ?   ?  ?  ?  ?  ? ?  ?AM-PAC OT "6 Clicks" Daily Activity     ?Outcome Measure ? ? Help from another person eating meals?: None ?Help from another person taking care of personal grooming?: None ?Help from another person toileting, which includes using toliet, bedpan, or urinal?: A Little ?Help from another person bathing (including washing, rinsing, drying)?: A Little ?Help from another person to put on and taking off regular upper body clothing?: None ?Help from another person to put on and taking off regular lower body clothing?: A Little ?6 Click Score: 21 ? ?  ?End of Session   ? ?OT Visit Diagnosis: Other abnormalities of gait and mobility (R26.89);Repeated falls (R29.6) ?  ?Activity Tolerance Patient tolerated treatment well ?  ?Patient Left in bed;with call bell/phone within reach;with bed alarm set;with nursing/sitter in room ?  ?Nurse Communication   ?  ? ?   ? ?Time: 1030-1045 ?OT Time Calculation (min): 15 min ? ?Charges: OT General Charges ?$OT Visit: 1 Visit ?OT Treatments ?$Self Care/Home Management : 8-22 mins ? ?Ardeth Perfect., MPH, MS, OTR/L ?ascom [phone] ?12/24/21, 1:52 PM ? ?

## 2021-12-24 NOTE — Progress Notes (Signed)
PT Cancellation Note ? ?Patient Details ?Name: Blandina Renaldo ?MRN: [id number] ?DOB: 21-Apr-1941 ? ? ?Cancelled Treatment:    Reason Eval/Treat Not Completed: Patient at procedure or test/unavailable.  Chart reviewed and attempted to see pt.  Upon arrival to room, pt currently having imaging performed.  Will re-attempt at later date/time as medically appropriate.   ? ? ?Gwenlyn Saran, PT, DPT ?12/24/21, 2:34 PM ? ?

## 2021-12-24 NOTE — Progress Notes (Signed)
*  PRELIMINARY RESULTS* ?Echocardiogram ?2D Echocardiogram has been performed. ? ?Joelene Millin ?12/24/2021, 12:26 PM ?

## 2021-12-24 NOTE — Progress Notes (Signed)
Nutrition Follow-up ? ?DOCUMENTATION CODES:  ? ?Not applicable ? ?INTERVENTION:  ?- Encourage adequate PO intake ?- Ensure Enlive po BID, each supplement provides 350 kcal and 20 grams of protein. ?- MVI with minerals ? ?NUTRITION DIAGNOSIS:  ? ?Increased nutrient needs related to chronic illness (CHF) as evidenced by estimated needs. ? ?Ongoing ? ?GOAL:  ? ?Patient will meet greater than or equal to 90% of their needs ? ?Addressing via meals and nutrition supplements ? ?MONITOR:  ? ?PO intake, I & O's, Weight trends ? ?REASON FOR ASSESSMENT:  ? ?Consult ? ("nutrition goals") ? ?ASSESSMENT:  ? ?81 y.o. female with a history of HTN, HLD, CHF, CAD, PVD, diverticulitis, vitamin D deficiency, GERD, DM type 1 presented to ED with worsening SOB. ? ?Attempted to obtain nutrition and weight history. Pt states that her typical diet includes yogurt, hamburgers once a month, chicken on occasion, eggs occasionally, pasta in moderation given her diabetes and greens. She has lunch with friends on Wednesdays and usually orders a salad. Her husband passed away in 12-14-15, therefore states that since she is the only parent and grandparent, she takes her health very seriously. She is followed by an endocrinologist who helps manage her blood sugar and has had discussions with her about her diabetes being inflammatory related rather than diet related. She reports checking her blood sugar daily and dosing her insulin accordingly.  ? ?No documented meal completions. Pt was eating omelette during visit. ? ?Pt did not provide weight history. Weight appears to be stable. Current weight noted to be 69.5 kg. Will continue to monitor throughout admission. ? ?Medications: lasix, SSI, semglee 46 untis daily, synthroid, melatonin, MVI, KLOR-CON ? ?Labs: potassium 2.9, HgbA1c 10.3%, CBG's 102-395 x24 hours ? ?NUTRITION - FOCUSED PHYSICAL EXAM: ?Deferred- eating and other providers present ? ?Diet Order:   ?Diet Order   ? ?       ?  Diet Carb Modified  Fluid consistency: Thin; Room service appropriate? Yes; Fluid restriction: 1800 mL Fluid  Diet effective now       ?  ? ?  ?  ? ?  ? ? ?EDUCATION NEEDS:  ? ?No education needs have been identified at this time ? ?Skin:  Skin Assessment: Reviewed RN Assessment ? ?Last BM:  PTA ? ?Height:  ? ?Ht Readings from Last 1 Encounters:  ?12/23/21 '5\' 3"'$  (1.6 m)  ? ? ?Weight:  ? ?Wt Readings from Last 1 Encounters:  ?12/23/21 69.5 kg  ? ? ?Ideal Body Weight:  52.3 kg ? ?BMI:  Body mass index is 27.16 kg/m?. ? ?Estimated Nutritional Needs:  ? ?Kcal:  1500-1700 kcal/d ? ?Protein:  75-90 g/d ? ?Fluid:  1.5-1.8 L/d ? ?Clayborne Dana, RDN, LDN ?Clinical Nutrition; ?

## 2021-12-24 NOTE — Progress Notes (Addendum)
Mobility Specialist - Progress Note ? ? 12/24/21 1400  ?Mobility  ?Activity Ambulated with assistance in hallway  ?Level of Assistance Standby assist, set-up cues, supervision of patient - no hands on  ?Assistive Device Front wheel walker  ?Distance Ambulated (ft) 320 ft  ?Activity Response Tolerated well  ?$Mobility charge 1 Mobility  ? ? ? ?O2 while resting on RA = 98%  ? ?O2 while AMB on RA = 94%  ? ?O2 while AMB on ?L = N/A ? ? ?Pt sitting EOB on arrival, utilizing RA. Pt reports consistent 8/10 back pain. Ambulated in hallway with supervision, no LOB but does voice dizziness throughout session---BP checked once returned to room 110/49. SOB. Pt left EOB with needs in reach, family at bedside. RN notified.  ? ? ?Kathee Delton ?Mobility Specialist ?12/24/21, 2:33 PM ? ?

## 2021-12-24 NOTE — Discharge Summary (Signed)
? ?Physician Discharge Summary ? ? ?Cassandra Benson  female DOB: Jan 19, 1941  ?TML:[id number] ? ?PCP: [name], MD ? ?Admit date: 12/22/2021 ?Discharge date: 12/24/2021 ? ?Admitted From: home ?Disposition:  home ?CODE STATUS: Full code ? ?Hospital Course:  ?For full details, please see H&P, progress notes, consult notes and ancillary notes.  ?Briefly,  ?Cassandra Benson is a 81 y.o. female with medical history significant of ANA positive, diabetes mellitus type 1, history of COVID-19, presenting with complains of shortness of breath that's been going on for past few days. ? ?* SOB (shortness of breath) ?--No documented hypoxia. ?--CTA chest performed after pre-medication, and was neg for PE, PNA, pulm edema or any abnormal lung findings.  Pt has no hx of COPD or smoking.  No hx of pulm HTN or valvular disease.  No cough or congestion to suggest acute viral illness.  COVID, Flu and RVP neg.  Not in DKA.  Echo with normal LV and only G1 DD.   ?--RN walked pt and pt was sating well on room air. ?--Despite extensive workup, no objective etiology for pt's dyspnea could be found.  Pt was not satisfied with the lack of answer, however, since there is not more further testing to be done, pt was discharged with PCP followup.      ?  ?LADA (latent autoimmune diabetes in adults), managed as type 1 ([place]) ?--discharged on home insulin regimen as below. ? ?Apnea, sleep ?CPAP per home settings. ?  ?Hypothyroidism due to Hashimoto's thyroiditis ?--continue patient on levothyroxine 75 mcg. ?  ?Essential hypertension ?--home losartan held on admission, resumed after discharge. ?--cont home coreg, lasix ?  ? ?Discharge Diagnoses:  ?Principal Problem: ?  SOB (shortness of breath) ?Active Problems: ?  Essential hypertension ?  Hypothyroidism due to Hashimoto's thyroiditis ?  Apnea, sleep ?  LADA (latent autoimmune diabetes in adults), managed as type 1 ([place]) ? ? ? ? ?Discharge Instructions: ? ?Allergies as of 12/24/2021    ? ?   Reactions  ? Iodinated Contrast Media Other (See Comments)  ? Itching (severe) and chest tightness  ? Penicillins Anaphylaxis, Swelling, Rash, Other (See Comments)  ? Has patient had a PCN reaction causing immediate rash, facial/tongue/throat swelling, SOB or lightheadedness with hypotension: Yes ?Has patient had a PCN reaction causing severe rash involving mucus membranes or skin necrosis: yes - remotely ?Has patient had a PCN reaction that required hospitalization: occurred while hospitalized ?Has patient had a PCN reaction occurring within the last 10 years: No ?If all of the above answers are "NO", then may proceed with Cephalosporin use.  ? Amlodipine Swelling  ? Pedal edema  ? Anectine [succinylcholine] Other (See Comments)  ? Brother went into cardiac arrest.  ? Codeine Nausea Only  ? Gabapentin Other (See Comments)  ? Gait abnormality  ? Influenza Vaccines Other (See Comments)  ? Muscle weakness; unable to walk  ? Nortriptyline Other (See Comments)  ? Eye swelling and mouth drawed up  ? Pamelor [nortriptyline Hcl] Other (See Comments)  ? Patient states caused her face to draw in together.  ? Valsartan Other (See Comments), Cough  ? Allergy to generic only, "Hacking" cough  ? Zetia [ezetimibe] Other (See Comments)  ? Bad muscle cramps  ? Erythromycin Rash, Swelling  ? Sulfa Antibiotics Rash  ? ?  ? ?  ?Medication List  ?  ? ?STOP taking these medications   ? ?benzonatate 100 MG capsule ?Commonly known as: TESSALON ?  ?predniSONE & diphenhydrAMINE 3  x 50 MG & 1 x 50 MG Kit ?  ? ?  ? ?TAKE these medications   ? ?acetaminophen 500 MG tablet ?Commonly known as: TYLENOL ?Take 1 tablet (500 mg total) by mouth 3 (three) times daily as needed. ?  ?BD Ultra-Fine Lancets lancets ?Use as instructed upto 6 times daily ?  ?carvedilol 3.125 MG tablet ?Commonly known as: COREG ?Take 1 tablet (3.125 mg total) by mouth 2 (two) times daily with a meal. ?  ?[name] tablet ?Commonly known as: PLAVIX ?TAKE ONE  TABLET BY MOUTH EVERY EVENING ?  ?Comfort EZ Pen Needles 32G X 4 MM Misc ?Generic drug: Insulin Pen Needle ?Use 4-5x a day ?  ?Dexcom G6 Transmitter Misc ?1 kit by Does not apply route See admin instructions. Use to check blood sugar ?  ?diclofenac Sodium 1 % Gel ?Commonly known as: VOLTAREN ?Apply 2 g topically 3 (three) times daily. ?  ?furosemide 20 MG tablet ?Commonly known as: Lasix ?Take 1 tablet (20 mg total) by mouth daily. ?  ?Glucagon Emergency 1 MG Kit ?INJECT into THE muscle ONCE AS NEEDED FOR emergency ?  ?Insulin Aspart FlexPen 100 UNIT/ML ?Commonly known as: NOVOLOG ?USE UP TO 50 UNITS DAILY in insulin pump ?What changed: additional instructions ?  ?losartan 25 MG tablet ?Commonly known as: COZAAR ?TAKE ONE TABLET BY MOUTH ONCE DAILY ?  ?OneTouch Ultra test strip ?Generic drug: glucose blood ?USE TO check blood glucose FOUR TIMES DAILY ?  ?rosuvastatin 20 MG tablet ?Commonly known as: CRESTOR ?TAKE ONE TABLET BY MOUTH EVERY EVENING ?  ?Synthroid 75 MCG tablet ?Generic drug: levothyroxine ?TAKE ONE TABLET BY MOUTH monday-saturday before breakfast ?  ?topiramate 50 MG tablet ?Commonly known as: Topamax ?Take 1 tablet (50 mg total) by mouth 2 (two) times daily. ?  ?[name] FlexTouch 100 UNIT/ML FlexTouch Pen ?Generic drug: insulin degludec ?Inject 46 Units into the skin daily. ?  ?vitamin C 100 MG tablet ?Take 100 mg by mouth daily. ?  ?Vitamin D3 25 MCG (1000 UT) Caps ?Take 1 capsule (1,000 Units total) by mouth daily. ?  ? ?  ? ? ? Follow-up Information   ? ? [name], MD Follow up in 1 week(s).   ?Specialty: Family Medicine ?Contact information: ?[address] ?[place] [address] ?[phone] ? ? ?  ?  ? ? Minna Merritts, MD .   ?Specialty: Cardiology ?Contact information: ?[address] ?[place] [address] ?[phone] ? ? ?  ?  ? ?  ?  ? ?  ? ? ?Allergies  ?Allergen Reactions  ? Iodinated Contrast Media Other (See Comments)  ?  Itching (severe) and chest  tightness  ? Penicillins Anaphylaxis, Swelling, Rash and Other (See Comments)  ?  Has patient had a PCN reaction causing immediate rash, facial/tongue/throat swelling, SOB or lightheadedness with hypotension: Yes ?Has patient had a PCN reaction causing severe rash involving mucus membranes or skin necrosis: yes - remotely ?Has patient had a PCN reaction that required hospitalization: occurred while hospitalized ?Has patient had a PCN reaction occurring within the last 10 years: No ?If all of the above answers are "NO", then may proceed with Cephalosporin use. ?  ? Amlodipine Swelling  ?  Pedal edema  ? Anectine [Succinylcholine] Other (See Comments)  ?  Brother went into cardiac arrest.  ? Codeine Nausea Only  ? Gabapentin Other (See Comments)  ?  Gait abnormality  ? Influenza Vaccines Other (See Comments)  ?  Muscle weakness; unable to walk  ? Nortriptyline Other (See Comments)  ?  Eye swelling and mouth drawed up  ? Pamelor [Nortriptyline Hcl] Other (See Comments)  ?  Patient states caused her face to draw in together.  ? Valsartan Other (See Comments) and Cough  ?  Allergy to generic only, "Hacking" cough  ? Zetia [Ezetimibe] Other (See Comments)  ?  Bad muscle cramps  ? Erythromycin Rash and Swelling  ? Sulfa Antibiotics Rash  ? ? ? ?The results of significant diagnostics from this hospitalization (including imaging, microbiology, ancillary and laboratory) are listed below for reference.  ? ?Consultations: ? ? ?Procedures/Studies: ?DG Chest 2 View ? ?Result Date: 12/22/2021 ?CLINICAL DATA:  Shortness of breath. EXAM: CHEST - 2 VIEW COMPARISON:  12/16/2021 FINDINGS: The heart size and mediastinal contours are within normal limits. Both lungs are clear. The visualized skeletal structures are unremarkable. IMPRESSION: No active cardiopulmonary disease. Electronically Signed   By: Kerby Moors M.D.   On: 12/22/2021 12:38  ? ?DG Chest 2 View ? ?Result Date: 12/16/2021 ?CLINICAL DATA:  Shortness of breath EXAM: CHEST  - 2 VIEW COMPARISON:  Previous chest radiographs done on 09/10/2021 and CT done on 11/12/2021. FINDINGS: The heart size and mediastinal contours are within normal limits. Both lungs are clear. The visual

## 2021-12-27 ENCOUNTER — Other Ambulatory Visit (INDEPENDENT_AMBULATORY_CARE_PROVIDER_SITE_OTHER): Payer: PPO

## 2021-12-27 ENCOUNTER — Telehealth: Payer: Self-pay

## 2021-12-27 DIAGNOSIS — R0600 Dyspnea, unspecified: Secondary | ICD-10-CM

## 2021-12-27 NOTE — Telephone Encounter (Signed)
Transition Care Management Follow-up Telephone Call ?Date of discharge and from where: [place] 12-24-21 Dx: [facility] ?How have you been since you were released from the hospital? Feeling ok  ?Any questions or concerns? No ? ?Items Reviewed: ?Did the pt receive and understand the discharge instructions provided? Yes  ?Medications obtained and verified? Yes  ?Other? No  ?Any new allergies since your discharge? No  ?Dietary orders reviewed? Yes ?Do you have support at home? Yes  ? ?Home Care and Equipment/Supplies: ?Were home health services ordered? no ?If so, what is the name of the agency? na  ?Has the agency set up a time to come to the patient's home? not applicable ?Were any new equipment or medical supplies ordered?  No ?What is the name of the medical supply agency? na ?Were you able to get the supplies/equipment? not applicable ?Do you have any questions related to the use of the equipment or supplies? No ? ?Functional Questionnaire: (I = Independent and D = Dependent) ?ADLs: I ? ?Bathing/Dressing- I ? ?Meal Prep- I ? ?Eating- I ? ?Maintaining continence- I ? ?Transferring/Ambulation- I ? ?Managing Meds- I ? ?Follow up appointments reviewed: ? ?PCP Hospital f/u appt confirmed? Yes  Scheduled to see Dr Danise Mina on 01-04-22 @ noon . ?[facility] f/u appt confirmed? Yes  Scheduled to see Dr Jackelyn Hoehn on 01-31-22 @ 1130am. ?Are transportation arrangements needed? No  ?If their condition worsens, is the pt aware to call PCP or go to the Emergency Dept.? Yes ?Was the patient provided with contact information for the PCP's office or ED? Yes ?Was to pt encouraged to call back with questions or concerns? Yes  ?

## 2021-12-28 LAB — BASIC METABOLIC PANEL
BUN/Creatinine Ratio: 13 (ref 12–28)
BUN: 14 mg/dL (ref 8–27)
CO2: 23 mmol/L (ref 20–29)
Calcium: 9.1 mg/dL (ref 8.7–10.3)
Chloride: 103 mmol/L (ref 96–106)
Creatinine, Ser: 1.09 mg/dL — ABNORMAL HIGH (ref 0.57–1.00)
Glucose: 290 mg/dL — ABNORMAL HIGH (ref 70–99)
Potassium: 3.9 mmol/L (ref 3.5–5.2)
Sodium: 139 mmol/L (ref 134–144)
eGFR: 52 mL/min/{1.73_m2} — ABNORMAL LOW (ref >59)

## 2022-01-04 ENCOUNTER — Ambulatory Visit (INDEPENDENT_AMBULATORY_CARE_PROVIDER_SITE_OTHER): Payer: PPO | Admitting: Family Medicine

## 2022-01-04 ENCOUNTER — Encounter: Payer: Self-pay | Admitting: Family Medicine

## 2022-01-04 VITALS — BP 122/70 | HR 81 | Temp 97.6°F | Ht 63.0 in | Wt 155.1 lb

## 2022-01-04 DIAGNOSIS — M3500 Sicca syndrome, unspecified: Secondary | ICD-10-CM | POA: Diagnosis not present

## 2022-01-04 DIAGNOSIS — M797 Fibromyalgia: Secondary | ICD-10-CM | POA: Diagnosis not present

## 2022-01-04 DIAGNOSIS — R079 Chest pain, unspecified: Secondary | ICD-10-CM

## 2022-01-04 DIAGNOSIS — R0609 Other forms of dyspnea: Secondary | ICD-10-CM | POA: Diagnosis not present

## 2022-01-04 NOTE — Patient Instructions (Addendum)
Heart evaluation during hospitalization was very reassuring.  ?We will refer you to pulmonologist for lung evaluation.  ?If reassuring evaluation, will start working on conditioning program.  ?Good to see you today.  ?

## 2022-01-04 NOTE — Progress Notes (Signed)
? ? Patient ID: Cassandra Benson, female    DOB: 1940/10/23, 81 y.o.   MRN: [id number] ? ?This visit was conducted in person. ? ?BP 122/70   Pulse 81   Temp 97.6 ?F (36.4 ?C) (Temporal)   Ht _0  (1.6 m)   Wt 155 lb 2 oz (70.4 kg)   SpO2 96%   BMI 27.48 kg/m?   ? ?CC: hospital f/u visit  ?Subjective:  ? ?HPI: ?Cassandra Benson is a 81 y.o. female presenting on 01/04/2022 for Hospitalization Follow-up (Admitted on 12/22/21 at [facility], dx hypoxia; DOE; chronic CHF; dypsnea. ) ? ? ?Recent hospitalization for progressive shortness of breath. Underwent reassuring extensive evlaution as per below.  ? ?CTA chest performed after pre-medication due to contrast allergy - negative for PE, PNA, pulmonary edema or other lung abnormality.  ?Echocardiogram performed - normal LV fxn with G1DD, no wall motion abnormalities, normal RV function and size, and normal pulm artery systolic pressure, mild MR, mild AR, aortic sclerosis without AS.  ?Normal ambulatory pulse ox while hospitalized.  ? ?COVID, flu, RSV negative.  ?Hospital records reviewed. Med rec performed.  ? ?Outpatient CT imaging was unable to be completed prior to hospitalization.  ? ?Home health not recommended. ?Other follow up appointments scheduled: CHF clinic 01/30/2022, cardiology f/u Rockey Situ) 02/18/2022.  ?Upcoming DEXA scan appointment tomorrow.  ? ?Notes ongoing dyspnea even with minimal exertion (ie housework). Notes some dyspnea at rest but this has improved since hospitalization. Some cough and mild wheezing, no fever. Ongoing chest and back soreness.  ?Non smoker.  ?No known exposure to fumes.  ?Known Sjogren's syndrome - ?autoimmune component to symptoms.  ?H/o OSA - hasn't recently used CPAP machine. Non-restorative sleep endorsed. Daytime somnolence noted.  ?_____________________________________________________________________ ?Hospital admission: 12/22/2021 ?Hospital discharge: 12/24/2021 ?TCM f/u phone call:  performed 12/27/2021 ? ?Admitted From:  home ?Disposition:  home ?CODE STATUS: Full code ? ?Discharge Diagnoses:  ?Principal Problem: ?  SOB (shortness of breath) ?Active Problems: ?  Essential hypertension ?  Hypothyroidism due to Hashimoto's thyroiditis ?  Apnea, sleep ?  LADA (latent autoimmune diabetes in adults), managed as type 1 ([place]) ?   ? ?Relevant past medical, surgical, family and social history reviewed and updated as indicated. Interim medical history since our last visit reviewed. ?Allergies and medications reviewed and updated. ?Outpatient Medications Prior to Visit  ?Medication Sig Dispense Refill  ? acetaminophen (TYLENOL) 500 MG tablet Take 1 tablet (500 mg total) by mouth 3 (three) times daily as needed.    ? Ascorbic Acid (VITAMIN C) 100 MG tablet Take 100 mg by mouth daily.    ? BD ULTRA-FINE LANCETS lancets Use as instructed upto 6 times daily 600 each 2  ? carvedilol (COREG) 3.125 MG tablet Take 1 tablet (3.125 mg total) by mouth 2 (two) times daily with a meal. 60 tablet 6  ? Cholecalciferol (VITAMIN D3) 25 MCG (1000 UT) CAPS Take 1 capsule (1,000 Units total) by mouth daily. 30 capsule   ? clopidogrel (PLAVIX) 75 MG tablet TAKE ONE TABLET BY MOUTH EVERY EVENING 30 tablet 6  ? COMFORT EZ PEN NEEDLES 32G X 4 MM MISC Use 4-5x a day 300 each 3  ? Continuous Blood Gluc Transmit (DEXCOM G6 TRANSMITTER) MISC 1 kit by Does not apply route See admin instructions. Use to check blood sugar 1 each 1  ? diclofenac Sodium (VOLTAREN) 1 % GEL Apply 2 g topically 3 (three) times daily. 100 g 3  ? furosemide (LASIX) 20 MG tablet  Take 1 tablet (20 mg total) by mouth daily. 30 tablet 1  ? Glucagon, rDNA, (GLUCAGON EMERGENCY) 1 MG KIT INJECT into THE muscle ONCE AS NEEDED FOR emergency 1 kit 12  ? Insulin Aspart FlexPen (NOVOLOG) 100 UNIT/ML USE UP TO 50 UNITS DAILY in insulin pump (Patient taking differently: 10 - 12 units with each meal) 50 mL 0  ? losartan (COZAAR) 25 MG tablet TAKE ONE TABLET BY MOUTH ONCE DAILY 90 tablet 0  ? ONETOUCH ULTRA test  strip USE TO check blood glucose FOUR TIMES DAILY 400 strip 2  ? rosuvastatin (CRESTOR) 20 MG tablet TAKE ONE TABLET BY MOUTH EVERY EVENING 90 tablet 0  ? SYNTHROID 75 MCG tablet TAKE ONE TABLET BY MOUTH monday-saturday before breakfast 80 tablet 1  ? topiramate (TOPAMAX) 50 MG tablet Take 1 tablet (50 mg total) by mouth 2 (two) times daily. 60 tablet 12  ? TRESIBA FLEXTOUCH 100 UNIT/ML FlexTouch Pen Inject 46 Units into the skin daily. 36 mL 1  ? ?No facility-administered medications prior to visit.  ?  ? ?Per HPI unless specifically indicated in ROS section below ?Review of Systems ? ?Objective:  ?BP 122/70   Pulse 81   Temp 97.6 ?F (36.4 ?C) (Temporal)   Ht _0  (1.6 m)   Wt 155 lb 2 oz (70.4 kg)   SpO2 96%   BMI 27.48 kg/m?   ?Wt Readings from Last 3 Encounters:  ?01/04/22 155 lb 2 oz (70.4 kg)  ?12/23/21 153 lb 4.8 oz (69.5 kg)  ?12/20/21 152 lb 8 oz (69.2 kg)  ?  ?  ?Physical Exam ?Vitals and nursing note reviewed.  ?Constitutional:   ?   Appearance: Normal appearance. She is not ill-appearing.  ?HENT:  ?   Mouth/Throat:  ?   Mouth: Mucous membranes are moist.  ?   Pharynx: Oropharynx is clear. No oropharyngeal exudate or posterior oropharyngeal erythema.  ?Eyes:  ?   Extraocular Movements: Extraocular movements intact.  ?   Pupils: Pupils are equal, round, and reactive to light.  ?Neck:  ?   Thyroid: No thyroid mass or thyromegaly.  ?Cardiovascular:  ?   Rate and Rhythm: Normal rate and regular rhythm.  ?   Pulses: Normal pulses.  ?   Heart sounds: Normal heart sounds. No murmur heard. ?Pulmonary:  ?   Effort: Pulmonary effort is normal. No respiratory distress.  ?   Breath sounds: Normal breath sounds. No wheezing, rhonchi or rales.  ?Musculoskeletal:  ?   Cervical back: Normal range of motion and neck supple.  ?   Right lower leg: No edema.  ?   Left lower leg: No edema.  ?Skin: ?   General: Skin is warm and dry.  ?   Findings: No rash.  ?Neurological:  ?   Mental Status: She is alert.  ?Psychiatric:      ?   Mood and Affect: Mood normal.     ?   Behavior: Behavior normal.  ? ?   ?Results for orders placed or performed in visit on 12/27/21  ?Basic Metabolic Panel (BMET)  ?Result Value Ref Range  ? Glucose 290 (H) 70 - 99 mg/dL  ? BUN 14 8 - 27 mg/dL  ? Creatinine, Ser 1.09 (H) 0.57 - 1.00 mg/dL  ? eGFR 52 (L) >59 mL/min/1.73  ? BUN/Creatinine Ratio 13 12 - 28  ? Sodium 139 134 - 144 mmol/L  ? Potassium 3.9 3.5 - 5.2 mmol/L  ? Chloride 103 96 - 106  mmol/L  ? CO2 23 20 - 29 mmol/L  ? Calcium 9.1 8.7 - 10.3 mg/dL  ? ? ?Assessment & Plan:  ? ?Problem List Items Addressed This Visit   ? ? Chest pain  ?  MSK chest wall pain - anticipate fibromyalgia related.  ?  ?  ? Fibromyalgia  ? 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  H/o sjogren, sicca syndrome, raynaud's  ?Last saw rheum several years ago - consider return  ?  ?  ? Dyspnea on exertion - Primary  ?  Completed reassuring workup while hospitalized - tested negative for PE, pneumonia, pulmonary edema or effusions, cardiac cause.  ?However she notes ongoing difficulty with significant shortness of breath, even with minimal exertion. No smoking or asthma or COPD history. ?ILD although CT was overall reassuring. Will refer to pulmonology for further evaluation ?PFTs.  ?Discussed if normal testing, consider rheum eval for possible autoimmune cause (in known Sjogren's) vs just dyspnea from deconditioning - would then work on stamina/conditioning program.  ?  ?  ? Relevant Orders  ? Ambulatory referral to Pulmonology  ?  ? ?No orders of the defined types were placed in this encounter. ? ?Orders Placed This Encounter  ?Procedures  ? Ambulatory referral to Pulmonology  ?  Referral Priority:   Routine  ?  Referral Type:   Consultation  ?  Referral Reason:   Specialty Services Required  ?  Requested Specialty:   Pulmonary Disease  ?  Number of Visits Requested:   1  ? ? ?Patient Instructions  ?Heart evaluation during hospitalization was very reassuring.   ?We will refer you to pulmonologist for lung evaluation.  ?If reassuring evaluation, will start working on conditioning program.  ?Good to see you today.  ? ?Follow up plan: ?Return if symptoms worsen or fail

## 2022-01-05 ENCOUNTER — Ambulatory Visit
Admission: RE | Admit: 2022-01-05 | Discharge: 2022-01-05 | Disposition: A | Discharge status: Home / Self Care | Payer: HMO | Source: Ambulatory Visit | Attending: Family Medicine | Admitting: Family Medicine

## 2022-01-05 DIAGNOSIS — Z780 Asymptomatic menopausal state: Secondary | ICD-10-CM | POA: Insufficient documentation

## 2022-01-05 DIAGNOSIS — M85832 Other specified disorders of bone density and structure, left forearm: Secondary | ICD-10-CM | POA: Diagnosis not present

## 2022-01-06 ENCOUNTER — Ambulatory Visit: Payer: HMO

## 2022-01-06 NOTE — Assessment & Plan Note (Signed)
MSK chest wall pain - anticipate fibromyalgia related.  ?

## 2022-01-06 NOTE — Assessment & Plan Note (Addendum)
H/o sjogren, sicca syndrome, raynaud's  ?Last saw rheum several years ago - consider return  ?

## 2022-01-06 NOTE — Assessment & Plan Note (Addendum)
Completed reassuring workup while hospitalized - tested negative for PE, pneumonia, pulmonary edema or effusions, cardiac cause.  ?However she notes ongoing difficulty with significant shortness of breath, even with minimal exertion. No smoking or asthma or COPD history. ?ILD although CT was overall reassuring. Will refer to pulmonology for further evaluation ?PFTs.  ?Discussed if normal testing, consider rheum eval for possible autoimmune cause (in known Sjogren's) vs just dyspnea from deconditioning - would then work on stamina/conditioning program.  ?

## 2022-01-12 ENCOUNTER — Other Ambulatory Visit: Payer: Self-pay | Admitting: Family Medicine

## 2022-01-12 NOTE — Telephone Encounter (Signed)
Refill request Losartan ?Last refill  ?Last office visit 01/04/22 ?Upcoming appointment 02/01/22 ?See allergy/contraindication 09/22/21 #90 ?

## 2022-01-13 ENCOUNTER — Telehealth: Payer: Self-pay

## 2022-01-13 NOTE — Chronic Care Management (AMB) (Signed)
? ? ?Chronic Care Management ?Pharmacy Assistant  ? ?Name: Cassandra Benson  MRN: [id number] DOB: 1941-04-05 ? ? ?Reason for Encounter: Medication Adherence and Delivery Coordination  ?  ? ?Recent office visits:  ?01/04/22-PCP-Javier Gutierrez,MD-Hospital follow up of hypoxia.Referral for pulmonology-no medication changes ? ?Recent consult visits:  ?12/20/21-[facility]- Heart Failure Clinic-continue to restrict fluid intake and salt intake.No medication changes  ?12/20/21-Cardiology-Cadence Furth,PA- hospital follow up-dyspnea on exertion-EKG,started on lasix 5m daily. ?12/16/21-[facility] ED-Dylan Smith,MD-Leg swelling and SOB.Labs ordered() EKG, chest xray,Lasix 451moral given-discussed starting a low-dose of Lasix and follow-up closely with the CHF clinic-discharged to home  ? ?Hospital visits:  ?Medication Reconciliation was completed by comparing discharge summary, patient?s EMR and Pharmacy list, and upon discussion with patient. ? ?Admitted to the hospital on 12/22/21 due to Shortness of Breath. Discharge date was 12/24/21. [facility]  ? ?Medications Discontinued at Hospital Discharge: ?-Stopped Benzonatate ? Prednisone Kit ?Medications that remain the same after Hospital Discharge:??  ?-All other medications will remain the same.   ? ?Medications: ?Outpatient Encounter Medications as of 01/13/2022  ?Medication Sig  ? acetaminophen (TYLENOL) 500 MG tablet Take 1 tablet (500 mg total) by mouth 3 (three) times daily as needed.  ? Ascorbic Acid (VITAMIN C) 100 MG tablet Take 100 mg by mouth daily.  ? BD ULTRA-FINE LANCETS lancets Use as instructed upto 6 times daily  ? carvedilol (COREG) 3.125 MG tablet Take 1 tablet (3.125 mg total) by mouth 2 (two) times daily with a meal.  ? Cholecalciferol (VITAMIN D3) 25 MCG (1000 UT) CAPS Take 1 capsule (1,000 Units total) by mouth daily.  ? clopidogrel (PLAVIX) 75 MG tablet TAKE ONE TABLET BY MOUTH EVERY EVENING  ? COMFORT EZ PEN NEEDLES 32G X 4 MM MISC Use 4-5x  a day  ? Continuous Blood Gluc Transmit (DEXCOM G6 TRANSMITTER) MISC 1 kit by Does not apply route See admin instructions. Use to check blood sugar  ? diclofenac Sodium (VOLTAREN) 1 % GEL Apply 2 g topically 3 (three) times daily.  ? furosemide (LASIX) 20 MG tablet Take 1 tablet (20 mg total) by mouth daily.  ? Glucagon, rDNA, (GLUCAGON EMERGENCY) 1 MG KIT INJECT into THE muscle ONCE AS NEEDED FOR emergency  ? Insulin Aspart FlexPen (NOVOLOG) 100 UNIT/ML USE UP TO 50 UNITS DAILY in insulin pump (Patient taking differently: 10 - 12 units with each meal)  ? losartan (COZAAR) 25 MG tablet TAKE ONE TABLET BY MOUTH ONCE DAILY  ? ONETOUCH ULTRA test strip USE TO check blood glucose FOUR TIMES DAILY  ? rosuvastatin (CRESTOR) 20 MG tablet TAKE ONE TABLET BY MOUTH EVERY EVENING  ? SYNTHROID 75 MCG tablet TAKE ONE TABLET BY MOUTH monday-saturday before breakfast  ? topiramate (TOPAMAX) 50 MG tablet Take 1 tablet (50 mg total) by mouth 2 (two) times daily.  ? TRESIBA FLEXTOUCH 100 UNIT/ML FlexTouch Pen Inject 46 Units into the skin daily.  ? ?No facility-administered encounter medications on file as of 01/13/2022.  ? ?BP Readings from Last 3 Encounters:  ?01/04/22 122/70  ?12/24/21 (!) 118/58  ?12/20/21 133/66  ?  ?Lab Results  ?Component Value Date  ? HGBA1C 10.3 (A) 11/09/2021  ?  ? ? ?Recent OV, Consult or Hospital visit:     [facility], started on Lasix 2039maily  ?Recent medication changes indicated:  ? ? ?Last adherence delivery date:12/27/21     ? ?Patient is due for next adherence delivery on: 01/25/22 ? ?Spoke with patient on 01/13/22  reviewed medications and coordinated delivery. ? ?This delivery to include: Vials  30 Days - easy open caps ?VIAL medications: ?Losartan 25 mg- take 1 tablet daily  ?[name] - Inject 46 units daily ?[name] - Use as directed  ?[name]- inject 5 units pre meals  ?Synthroid 75 mcg - 1 tablet daily Monday-Saturday (before  breakfast) ?[name] - 1 tablet every evening  ?[name] - take 1 tablet daily - started in ED  ? ?Patient declined the following medications this month: ?[place]  ?Venlafaxine 37.580m  Patient only taking as needed at this time ?Vitamin D- take 2000 units daily ( gets OTC) ?Ubrelvey 5080mtake 1 tablet at onset of HA as needed ?Glucagon 1mg41mjection - use as needed ?Rosuvastatin 20mg21mke 1 tablet evening- has >30 tablets left ?Carvedilol 3.125mg 60me 1 tablet twice daily -has >60 tablets left  ?Topiramate 50mg- 22m 1 tablet 2 times daily- has >60 tablets left ?Trueplus pen needle 32g4mm- us20ms directed- has supply on hand  ? ?Any concerns about your medications? Yes ? ?How often do you forget or accidentally miss a dose? Never ? ?Do you use a pillbox? Yes ? ? ? ?Refills requested from providers include:losartan ? ?Confirmed delivery date of 01/25/22, advised patient that pharmacy will contact them the morning of delivery. ? ?Recent blood pressure readings are as follows:none available  ? ? ?Recent blood glucose readings are as follows: ?Fasting:  the patient  did have a reading of 129 one morning and wanted to dance in the kitchen ? ? ?Annual wellness visit in last year? Yes ?Most Recent BP reading:122/70 81-P ? ?If Diabetic: ?Most recent A1C reading:10.3  11/09/21 ?Last eye exam / retinopathy screening: 10/2020 ?Last diabetic foot exam:UTD ? ?[name] ? ?Ashleen Demma, CCMA ?Health concierge  ?[phone]

## 2022-01-13 NOTE — Telephone Encounter (Signed)
ERx 

## 2022-01-15 ENCOUNTER — Encounter: Payer: Self-pay | Admitting: Family Medicine

## 2022-01-15 DIAGNOSIS — M8580 Other specified disorders of bone density and structure, unspecified site: Secondary | ICD-10-CM | POA: Insufficient documentation

## 2022-01-18 MED ORDER — VITAMIN D3 25 MCG (1000 UT) PO CAPS: 1.0000 | ORAL_CAPSULE | Freq: Two times a day (BID) | ORAL | Status: DC

## 2022-01-18 MED ORDER — MULTIVITAMIN ADULT PO TABS: 1.0000 | ORAL_TABLET | Freq: Two times a day (BID) | ORAL | Status: AC

## 2022-01-18 MED ORDER — VITAMIN D3 25 MCG (1000 UT) PO CAPS: 1.0000 | ORAL_CAPSULE | Freq: Every day | ORAL | Status: DC

## 2022-01-19 NOTE — Telephone Encounter (Signed)
The patient has PCP follow up 02/01/22 and then she has scheduled appointments with cardiology and endocrinology all in May, We will be doing a medication adherence call  late May to the patient . ? ? ?Thania Woodlief, CCMA ?Health concierge  ?[phone]  ?

## 2022-01-31 ENCOUNTER — Encounter: Payer: Self-pay | Admitting: Family

## 2022-01-31 ENCOUNTER — Ambulatory Visit: Payer: HMO | Attending: Family | Admitting: Family

## 2022-01-31 VITALS — BP 99/62 | HR 72 | Resp 20 | Ht 63.0 in | Wt 154.5 lb

## 2022-01-31 DIAGNOSIS — I5032 Chronic diastolic (congestive) heart failure: Secondary | ICD-10-CM

## 2022-01-31 DIAGNOSIS — I110 Hypertensive heart disease with heart failure: Secondary | ICD-10-CM | POA: Insufficient documentation

## 2022-01-31 DIAGNOSIS — R5383 Other fatigue: Secondary | ICD-10-CM | POA: Insufficient documentation

## 2022-01-31 DIAGNOSIS — Z79899 Other long term (current) drug therapy: Secondary | ICD-10-CM | POA: Diagnosis not present

## 2022-01-31 DIAGNOSIS — I739 Peripheral vascular disease, unspecified: Secondary | ICD-10-CM | POA: Insufficient documentation

## 2022-01-31 DIAGNOSIS — E139 Other specified diabetes mellitus without complications: Secondary | ICD-10-CM

## 2022-01-31 DIAGNOSIS — I10 Essential (primary) hypertension: Secondary | ICD-10-CM

## 2022-01-31 DIAGNOSIS — E785 Hyperlipidemia, unspecified: Secondary | ICD-10-CM | POA: Insufficient documentation

## 2022-01-31 DIAGNOSIS — M797 Fibromyalgia: Secondary | ICD-10-CM | POA: Insufficient documentation

## 2022-01-31 DIAGNOSIS — E039 Hypothyroidism, unspecified: Secondary | ICD-10-CM | POA: Diagnosis not present

## 2022-01-31 DIAGNOSIS — E138 Other specified diabetes mellitus with unspecified complications: Secondary | ICD-10-CM | POA: Insufficient documentation

## 2022-01-31 DIAGNOSIS — M3500 Sicca syndrome, unspecified: Secondary | ICD-10-CM | POA: Diagnosis not present

## 2022-01-31 DIAGNOSIS — I2510 Atherosclerotic heart disease of native coronary artery without angina pectoris: Secondary | ICD-10-CM | POA: Insufficient documentation

## 2022-01-31 NOTE — Patient Instructions (Addendum)
Resume weighing daily and call for an overnight weight gain of 3 pounds or more or a weekly weight gain of more than 5 pounds.   If you have voicemail, please make sure your mailbox is cleaned out so that we may leave a message and please make sure to listen to any voicemails.     

## 2022-01-31 NOTE — Progress Notes (Signed)
? Patient ID: Cassandra Benson, female    DOB: 1941-04-02, 81 y.o.   MRN: [id number] ? ?HPI ? ?Cassandra Benson is a 81 yo female with a PMHx significant for HTN, HLD, hypothyroidism, CAD, LADA, HA's, fibromyalgia, PAD, Sjogren's syndrome, and CHF.  ? ?Echo report from 12/24/21 reviewed and showed an EF of 60-65% along with mild MR. Echo from 07/30/20 reviewed and showed an EF of 55-60% with grade II diastolic dysfunction.  ? ?Admitted 12/22/21 due to hypoxia. Chest CTA negative for PE/PNA/pulmonary edema. Covid/flu negative. Losartan initially held but then resumed. PT/OT evaluations done. Discharged after 2 days. In the ED on 12/16/21 with dyspnea on exertion and lower extremity swelling, felt to be an exacerbation of her diastolic dysfunction and CHF. She was given lasix 40 mg PO and d/c'd home with lasix 20 mg PO QD.  ? ?She presents today for a follow-up visit with a chief complaint of moderate fatigue with minimal exertion. She describes this as chronic in nature. She has associated dry cough, shortness of breath, chest tightness and right hip pain along with this. She denies any difficulty sleeping, dizziness, chest pain, edema, palpitations, abdominal distention, weight gain or appetite change.  ? ?Not weighing herself but does have working scales at home.  ? ?Past Medical History:  ?Diagnosis Date  ? Acute diverticulitis 04/2021  ? [place] ER, CT confirmed  ? Allergy   ? ANA positive   ? positive ANA pattern 1 speckled  ? Arthritis   ? Carotid stenosis, asymptomatic 06/19/2015  ? 8-10% RICA 17-51% LICA rpt 1 yr (0/2585)   ? CHF (congestive heart failure) ([place])   ? Colon polyps   ? COVID-19 virus infection 09/14/2021  ? Dermatomyositis ([place])   ? Diabetes mellitus without complication ([place])   ? Type 1  ? Diverticulosis   ? sigmoid on CT scan 12/2019  ? Family history of adverse reaction to anesthesia   ? brothr went into cardiac arrest from anectine  ? Fibromyalgia   ? prior PCP  ? GERD (gastroesophageal reflux  disease)   ? prior PCP  ? Glaucoma   ? Narrow angle  ? History of blood clots   ? DVT, in 20s, none since  ? History of chicken pox   ? History of diverticulitis   ? History of pericarditis 1986  ? with hospitalization  ? History of pneumonia 2014  ? History of shingles   ? History of UTI   ? Hyperlipidemia   ? Hypertension   ? Hypothyroidism   ? Mixed connective tissue disease ([place])   ? Partial small bowel obstruction ([place]) 12/2019  ? managed conservatively  ? Peptic ulcer   ? Pneumonia   ? PONV (postoperative nausea and vomiting)   ? Raynaud's disease without gangrene   ? Shoulder pain left  ? h/o RTC tendonitis and adhesive capsulitis  ? Sjogren's syndrome ([place])   ? Sleep apnea   ? prior PCP - no CPAP for about 10 yrs  ? 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[place])   ? Vitamin D deficiency   ? prior PCP  ? ?Past Surgical History:  ?Procedure Laterality Date  ? ABDOMINAL HYSTERECTOMY  1978  ? fibroids and menorrhagia, ovaries remain  ? ARTERY BIOPSY Right 04/06/2018  ? Procedure: BIOPSY TEMPORAL ARTERY RIGHT;  Surgeon: Beverly Gust, MD;  Location: [place];  Service: ENT;  Laterality: Right;  Diabetic - insulin pump ?sleep apnea  ? CARDIAC CATHETERIZATION  2000  ? [facility] normal per patient  ?  COLONOSCOPY  10/2011  ? 1 TA, 1 HP, very tortuous colon ([place])  ? COLONOSCOPY  02/2020  ? TA, inflammatory polyp, mod diverticulosis, int/ext hemorrhoids ([place]) no rpt recommended   ? COLONOSCOPY WITH ESOPHAGOGASTRODUODENOSCOPY (EGD)  03/2007  ? 2 ulcers, benign polyp, rpt 5 yrs ([place], [place])  ? JOINT REPLACEMENT Right   ? hip  ? PARTIAL HIP ARTHROPLASTY  2013  ? Right hip replacement  ? TONSILLECTOMY    ? TONSILLECTOMY AND ADENOIDECTOMY    ? TRANSCAROTID ARTERY REVASCULARIZATION?  Left 07/23/2018  ? Procedure: TRANSCAROTID ARTERY REVASCULARIZATION;  Surgeon: Marty Heck, MD;  Location: [place] [place];  Service: Vascular;  Laterality: Left;  ? TUBAL LIGATION    ? VAGINAL DELIVERY    ? x2, no complications   ? ?Family History  ?Problem Relation Age of Onset  ? CAD Mother 53  ?     MI, aortic valve issues  ? COPD Mother   ? Lupus Mother   ? Graves' disease Mother   ? Rheum arthritis Mother   ? CAD Father 84  ?     CABG x2, aortic valve replacement  ? Stroke Sister   ? CAD Sister   ? Anuerysm Sister   ?     brain  ? Lupus Sister   ? Diabetes Sister   ? Alcohol abuse Brother   ? CAD Brother 51  ?     MI  ? Stroke Brother   ? Seizures Son   ? COPD Brother   ?     agent orange  ? CAD Brother 75  ?     stent  ? Diabetes Brother   ? Depression Grandchild   ? Breast cancer Maternal Aunt   ? Diabetes Sister   ? Breast cancer Sister   ? Breast cancer Maternal Aunt   ? Stroke Maternal Grandmother   ? Hypertension Maternal Grandmother   ? Gallbladder disease Maternal Grandmother   ? Colon cancer Neg Hx   ? Esophageal cancer Neg Hx   ? Rectal cancer Neg Hx   ? Stomach cancer Neg Hx   ? ?Social History  ? ?Tobacco Use  ? Smoking status: Never  ? Smokeless tobacco: Never  ?Substance Use Topics  ? Alcohol use: No  ? ?Allergies  ?Allergen Reactions  ? Iodinated Contrast Media Other (See Comments)  ?  Itching (severe) and chest tightness  ? Penicillins Anaphylaxis, Swelling, Rash and Other (See Comments)  ?  Has patient had a PCN reaction causing immediate rash, facial/tongue/throat swelling, SOB or lightheadedness with hypotension: Yes ?Has patient had a PCN reaction causing severe rash involving mucus membranes or skin necrosis: yes - remotely ?Has patient had a PCN reaction that required hospitalization: occurred while hospitalized ?Has patient had a PCN reaction occurring within the last 10 years: No ?If all of the above answers are "NO", then may proceed with Cephalosporin use. ?  ? Amlodipine Swelling  ?  Pedal edema  ? Anectine [Succinylcholine] Other (See Comments)  ?  Brother went into cardiac arrest.  ? Codeine Nausea Only  ? Gabapentin Other (See Comments)  ?  Gait abnormality  ? Influenza Vaccines Other (See Comments)  ?   Muscle weakness; unable to walk  ? Nortriptyline Other (See Comments)  ?  Eye swelling and mouth drawed up  ? Pamelor [Nortriptyline Hcl] Other (See Comments)  ?  Patient states caused her face to draw in together.  ? Valsartan Other (See Comments) and Cough  ?  Allergy to generic only, "Hacking" cough  ? Zetia [Ezetimibe] Other (See Comments)  ?  Bad muscle cramps  ? Erythromycin Rash and Swelling  ? Sulfa Antibiotics Rash  ? ?Prior to Admission medications   ?Medication Sig Start Date End Date Taking? Authorizing Provider  ?acetaminophen (TYLENOL) 500 MG tablet Take 1 tablet (500 mg total) by mouth 3 (three) times daily as needed. 01/03/20  Yes [name], MD  ?Ascorbic Acid (VITAMIN C) 100 MG tablet Take 100 mg by mouth daily.   Yes [provider]  ?BD ULTRA-FINE LANCETS lancets Use as instructed upto 6 times daily 09/02/14  Yes Phadke, Radhika P, MD  ?carvedilol (COREG) 3.125 MG tablet Take 1 tablet (3.125 mg total) by mouth 2 (two) times daily with a meal. 11/04/21  Yes [name], MD  ?Cholecalciferol (VITAMIN D3) 25 MCG (1000 UT) CAPS Take 1 capsule (1,000 Units total) by mouth daily. 01/18/22  Yes [name], MD  ?clopidogrel (PLAVIX) 75 MG tablet TAKE ONE TABLET BY MOUTH EVERY EVENING 07/15/21  Yes Waynetta Sandy, MD  ?COMFORT EZ PEN NEEDLES 32G X 4 MM MISC Use 4-5x a day 11/18/21  Yes Philemon Kingdom, MD  ?Continuous Blood Gluc Transmit (DEXCOM G6 TRANSMITTER) MISC 1 kit by Does not apply route See admin instructions. Use to check blood sugar 09/18/19  Yes Philemon Kingdom, MD  ?diclofenac Sodium (VOLTAREN) 1 % GEL Apply 2 g topically 3 (three) times daily. 03/15/20  Yes [name], MD  ?furosemide (LASIX) 20 MG tablet Take 1 tablet (20 mg total) by mouth daily. 12/16/21 12/16/22 Yes Vladimir Crofts, MD  ?Glucagon, rDNA, (GLUCAGON EMERGENCY) 1 MG KIT INJECT into THE muscle ONCE AS NEEDED FOR emergency 11/18/21  Yes Philemon Kingdom, MD  ?Insulin Aspart FlexPen  (NOVOLOG) 100 UNIT/ML USE UP TO 50 UNITS DAILY in insulin pump ?Patient taking differently: 10 - 12 units with each meal 05/20/21  Yes Philemon Kingdom, MD  ?losartan (COZAAR) 25 MG tablet TAKE ONE TABLET BY MOUTH ON

## 2022-02-01 ENCOUNTER — Ambulatory Visit (INDEPENDENT_AMBULATORY_CARE_PROVIDER_SITE_OTHER): Payer: HMO | Admitting: Family Medicine

## 2022-02-01 ENCOUNTER — Encounter: Payer: Self-pay | Admitting: Family Medicine

## 2022-02-01 VITALS — BP 124/70 | HR 78 | Temp 97.7°F | Ht 63.0 in | Wt 158.2 lb

## 2022-02-01 DIAGNOSIS — M3500 Sicca syndrome, unspecified: Secondary | ICD-10-CM | POA: Diagnosis not present

## 2022-02-01 DIAGNOSIS — K760 Fatty (change of) liver, not elsewhere classified: Secondary | ICD-10-CM | POA: Diagnosis not present

## 2022-02-01 DIAGNOSIS — G8929 Other chronic pain: Secondary | ICD-10-CM

## 2022-02-01 DIAGNOSIS — R0609 Other forms of dyspnea: Secondary | ICD-10-CM

## 2022-02-01 DIAGNOSIS — I5189 Other ill-defined heart diseases: Secondary | ICD-10-CM

## 2022-02-01 DIAGNOSIS — I7300 Raynaud's syndrome without gangrene: Secondary | ICD-10-CM

## 2022-02-01 DIAGNOSIS — M797 Fibromyalgia: Secondary | ICD-10-CM

## 2022-02-01 DIAGNOSIS — M546 Pain in thoracic spine: Secondary | ICD-10-CM | POA: Diagnosis not present

## 2022-02-01 DIAGNOSIS — G4700 Insomnia, unspecified: Secondary | ICD-10-CM

## 2022-02-01 NOTE — Progress Notes (Signed)
? ? Patient ID: Cassandra Benson, female    DOB: Dec 14, 1940, 81 y.o.   MRN: [id number] ? ?This visit was conducted in person. ? ?BP 124/70   Pulse 78   Temp 97.7 ?F (36.5 ?C) (Temporal)   Ht '5\' 3"'  (1.6 m)   Wt 158 lb 4 oz (71.8 kg)   SpO2 98%   BMI 28.03 kg/m?   ? ?CC: 4 mo f/u visit  ?Subjective:  ? ?HPI: ?Cassandra Benson is a 81 y.o. female presenting on 02/01/2022 for Follow-up (Here for 4 mo f/u.) ? ? ?S/p hospitalization 11/2021 for progressive shortness of breath s/p overall reassuring evaluation.  ?CTA chest (after pre-medication due to contrast allergy) negative for PE, PNA, pulm edema, or other lung abnormalities.  ?Echocardiogram - reassuringly ok.  ? ?Saw CHF clinic yesterday - overall good report. rec daily weights, continue current regimen including cozaar, coreg, lasix 12m.  ? ?H/o sjogren's syndrome without extraglandular involvement and raynaud disease.  ? ?For ongoing dyspnea difficulty, even with minimal exertion, recommended further eval with pulm. If unrevealing, to consider return to rheumatology to help r/o autoimmune cause for symptoms. Still pending both of these appointments.  ? ?Notes ongoing chronic lower back pain, manages with tylenol 10084mTID to QID.  ?Also notes R sided chest wall pain and bilateral upper thoracic back pain.  ? ?Insomnia - some days only sleeping 3 hours/night. Melatonin sometimes helps but overly sedated the next morning.  ? ?Fibromyalgia - lyrica previously may have caused weight gain. Gabapentin may have caused gait unsteadiness.  ? ?[facility]  ?   ? ?Relevant past medical, surgical, family and social history reviewed and updated as indicated. Interim medical history since our last visit reviewed. ?Allergies and medications reviewed and updated. ?Outpatient Medications Prior to Visit  ?Medication Sig Dispense Refill  ? acetaminophen (TYLENOL) 500 MG tablet Take 1 tablet (500  mg total) by mouth 3 (three) times daily as needed.    ? Ascorbic Acid (VITAMIN C) 100 MG tablet Take 100 mg by mouth daily.    ? BD ULTRA-FINE LANCETS lancets Use as instructed upto 6 times daily 600 each 2  ? carvedilol (COREG) 3.125 MG tablet Take 1 tablet (3.125 mg total) by mouth 2 (two) times daily with a meal. 60 tablet 6  ? Cholecalciferol (VITAMIN D3) 25 MCG (1000 UT) CAPS Take 1 capsule (1,000 Units total) by mouth daily. 30 capsule   ? clopidogrel (PLAVIX) 75 MG tablet TAKE ONE TABLET BY MOUTH EVERY EVENING 30 tablet 6  ? COMFORT EZ PEN NEEDLES 32G X 4 MM MISC Use 4-5x a day 300 each 3  ? Continuous Blood Gluc Transmit (DEXCOM G6 TRANSMITTER) MISC 1 kit by Does not apply route See admin instructions. Use to check blood sugar 1 each 1  ? diclofenac Sodium (VOLTAREN) 1 % GEL Apply 2 g topically 3 (three) times daily. 100 g 3  ? furosemide (LASIX) 20 MG tablet Take 1 tablet (20 mg total) by mouth daily. 30 tablet 1  ? Glucagon, rDNA, (GLUCAGON EMERGENCY) 1 MG KIT INJECT into THE muscle ONCE AS NEEDED FOR emergency 1 kit 12  ? Insulin Aspart FlexPen (NOVOLOG) 100 UNIT/ML USE UP TO 50 UNITS DAILY in insulin pump (Patient taking differently: 10 - 12 units with each meal) 50 mL 0  ? losartan (COZAAR) 25 MG tablet TAKE ONE TABLET BY MOUTH ONCE DAILY 90 tablet 3  ?  Multiple Vitamin (MULTIVITAMIN ADULT) TABS Take 1 tablet by mouth in the morning and at bedtime.    ? ONETOUCH ULTRA test strip USE TO check blood glucose FOUR TIMES DAILY 400 strip 2  ? rosuvastatin (CRESTOR) 20 MG tablet TAKE ONE TABLET BY MOUTH EVERY EVENING 90 tablet 2  ? SYNTHROID 75 MCG tablet TAKE ONE TABLET BY MOUTH monday-saturday before breakfast 80 tablet 1  ? topiramate (TOPAMAX) 50 MG tablet Take 1 tablet (50 mg total) by mouth 2 (two) times daily. 60 tablet 12  ? TRESIBA FLEXTOUCH 100 UNIT/ML FlexTouch Pen Inject 46 Units into the skin daily. 36 mL 1  ? ?No facility-administered medications prior to visit.  ?  ? ?Per HPI unless  specifically indicated in ROS section below ?Review of Systems ? ?Objective:  ?BP 124/70   Pulse 78   Temp 97.7 ?F (36.5 ?C) (Temporal)   Ht '5\' 3"'  (1.6 m)   Wt 158 lb 4 oz (71.8 kg)   SpO2 98%   BMI 28.03 kg/m?   ?Wt Readings from Last 3 Encounters:  ?02/01/22 158 lb 4 oz (71.8 kg)  ?01/31/22 154 lb 8 oz (70.1 kg)  ?01/04/22 155 lb 2 oz (70.4 kg)  ?  ?  ?Physical Exam ?Vitals and nursing note reviewed.  ?Constitutional:   ?   Appearance: Normal appearance. She is not ill-appearing.  ?HENT:  ?   Mouth/Throat:  ?   Mouth: Mucous membranes are moist.  ?   Pharynx: Oropharynx is clear. No oropharyngeal exudate or posterior oropharyngeal erythema.  ?Eyes:  ?   Extraocular Movements: Extraocular movements intact.  ?   Pupils: Pupils are equal, round, and reactive to light.  ?Cardiovascular:  ?   Rate and Rhythm: Normal rate and regular rhythm.  ?   Pulses: Normal pulses.  ?   Heart sounds: Normal heart sounds. No murmur heard. ?Pulmonary:  ?   Effort: Pulmonary effort is normal. No respiratory distress.  ?   Breath sounds: Normal breath sounds. No wheezing, rhonchi or rales.  ?Chest:  ?   Chest wall: Tenderness (diffuse discomfort to palpation along R chest wall and posterior chest at bilateral thoracic area) present.  ?Skin: ?   General: Skin is warm and dry.  ?   Findings: No rash.  ?Neurological:  ?   Mental Status: She is alert.  ?Psychiatric:     ?   Mood and Affect: Mood normal.     ?   Behavior: Behavior normal.  ? ?   ?Results for orders placed or performed in visit on 12/27/21  ?Basic Metabolic Panel (BMET)  ?Result Value Ref Range  ? Glucose 290 (H) 70 - 99 mg/dL  ? BUN 14 8 - 27 mg/dL  ? Creatinine, Ser 1.09 (H) 0.57 - 1.00 mg/dL  ? eGFR 52 (L) >59 mL/min/1.73  ? BUN/Creatinine Ratio 13 12 - 28  ? Sodium 139 134 - 144 mmol/L  ? Potassium 3.9 3.5 - 5.2 mmol/L  ? Chloride 103 96 - 106 mmol/L  ? CO2 23 20 - 29 mmol/L  ? Calcium 9.1 8.7 - 10.3 mg/dL  ? ? ?Assessment & Plan:  ? ?Problem List Items Addressed  This Visit   ? ? Fatty liver  ?  Discussed max 3064m daily tylenol dosing. ?Latest LFTs normal. ?Abdomen pelvis CT from August 2022 showed mild fatty liver changes. ?Consider updating hepatitis A/B vaccines if not already done. ? ?  ?  ? Fibromyalgia  ?  This exacerbates pain. ?  History of intolerance to gabapentin, Lyrica, TCAs in the past. ? ?  ?  ? Raynaud disease  ? Sjogren's syndrome without extraglandular involvement ([place])  ? Chronic midline thoracic back pain  ? Grade II diastolic dysfunction  ?  Has established with advanced CHF clinic.  Appreciate their care.  Continue Lasix 20 mg daily.  Has been recommended daily weights ? ?  ?  ? Dyspnea on exertion - Primary  ?  Ongoing significant dyspnea even with minimal exertion, associated with chest wall pain and thoracic back pain.  Pending pulmonology evaluation.  I gave her the number to call and schedule an appointment.  If unrevealing, will recommend rheumatology follow-up to see if an autoimmune cause of dyspnea. ? ?  ?  ? Insomnia  ?  Discussed melatonin use, would limit to 3 to 5 mg nightly.  She wants to avoid habit-forming medication.  We will need to review sleep hygiene measures. ? ?  ?  ?  ? ?No orders of the defined types were placed in this encounter. ? ?No orders of the defined types were placed in this encounter. ? ? ? ?Patient Instructions  ?Call  pulmonary in [place] to schedule lung evaluation for ongoing shortness of breath [phone]. ?Max tylenol 3000 mg (six 548m tablets)/day.  ?Limit melatonin to 3-5 mg/night.  ?Good to see you today.  ?Return in 3 months for follow up visit.  ? ?Follow up plan: ?Return in about 3 months (around 05/04/2022), or if symptoms worsen or fail to improve, for follow up visit. ? ?JRia Bush MD   ?

## 2022-02-01 NOTE — Assessment & Plan Note (Addendum)
Discussed melatonin use, would limit to 3 to 5 mg nightly.  She wants to avoid habit-forming medication.  We will need to review sleep hygiene measures. ?

## 2022-02-01 NOTE — Assessment & Plan Note (Addendum)
Discussed max '3000mg'$  daily tylenol dosing. ?Latest LFTs normal. ?Abdomen pelvis CT from August 2022 showed mild fatty liver changes. ?Consider updating hepatitis A/B vaccines if not already done. ?

## 2022-02-01 NOTE — Patient Instructions (Addendum)
Call [facility] in [place] to schedule lung evaluation for ongoing shortness of breath [phone]. ?Max tylenol 3000 mg (six '500mg'$  tablets)/day.  ?Limit melatonin to 3-5 mg/night.  ?Good to see you today.  ?Return in 3 months for follow up visit.  ?

## 2022-02-01 NOTE — Assessment & Plan Note (Signed)
Has established with advanced CHF clinic.  Appreciate their care.  Continue Lasix 20 mg daily.  Has been recommended daily weights ?

## 2022-02-01 NOTE — Assessment & Plan Note (Signed)
This exacerbates pain. ?History of intolerance to gabapentin, Lyrica, TCAs in the past. ?

## 2022-02-01 NOTE — Assessment & Plan Note (Addendum)
Ongoing significant dyspnea even with minimal exertion, associated with chest wall pain and thoracic back pain.  Pending pulmonology evaluation.  I gave her the number to call and schedule an appointment.  If unrevealing, will recommend rheumatology follow-up to see if an autoimmune cause of dyspnea. ?

## 2022-02-02 ENCOUNTER — Telehealth: Payer: Self-pay | Admitting: Family Medicine

## 2022-02-02 NOTE — Telephone Encounter (Signed)
Pt called and wanted Dr. Danise Mina to know that she is currently on TRESIBA FLEXTOUCH 100 UNIT/ML FlexTouch Pen and "she found out recently that she has a side effect from that which is "congested heart failure" and she wants to come off of it." Would like to speak with the nurse. ? ?Callback Number: [phone] ?

## 2022-02-03 NOTE — Telephone Encounter (Signed)
CHF worsening is more a side effect of diabetic medication Actos which she is not on. I really don't think the tresiba or any insulin is contributing to heart failure symptoms so I don't recommend stopping tresiba.  ?I also don't recommend switching her insulin without consulting with her endocrinologist first.  ?

## 2022-02-03 NOTE — Telephone Encounter (Signed)
Spoke with Cassandra Benson relaying Dr. Synthia Innocent message. Cassandra Benson verbalizes understanding and will discuss with endo at next f/u on 02/09/22. ?

## 2022-02-04 NOTE — Telephone Encounter (Signed)
This has been addressed.  See recent phone note. ?

## 2022-02-08 ENCOUNTER — Encounter: Payer: Self-pay | Admitting: Internal Medicine

## 2022-02-08 ENCOUNTER — Ambulatory Visit (INDEPENDENT_AMBULATORY_CARE_PROVIDER_SITE_OTHER): Payer: HMO | Admitting: Internal Medicine

## 2022-02-08 VITALS — BP 120/72 | HR 68 | Ht 63.0 in | Wt 152.8 lb

## 2022-02-08 DIAGNOSIS — E038 Other specified hypothyroidism: Secondary | ICD-10-CM | POA: Diagnosis not present

## 2022-02-08 DIAGNOSIS — E782 Mixed hyperlipidemia: Secondary | ICD-10-CM | POA: Diagnosis not present

## 2022-02-08 DIAGNOSIS — E139 Other specified diabetes mellitus without complications: Secondary | ICD-10-CM | POA: Diagnosis not present

## 2022-02-08 DIAGNOSIS — E063 Autoimmune thyroiditis: Secondary | ICD-10-CM

## 2022-02-08 LAB — POCT GLYCOSYLATED HEMOGLOBIN (HGB A1C): Hemoglobin A1C: 11 % — AB (ref 4.0–5.6)

## 2022-02-08 NOTE — Progress Notes (Signed)
Patient ID: Cassandra Benson, female   DOB: 1941/04/24, 81 y.o.   MRN: [id number]  ? ?This visit occurred during the SARS-CoV-2 public health emergency.  Safety protocols were in place, including screening questions prior to the visit, additional usage of staff PPE, and extensive cleaning of exam room while observing appropriate contact time as indicated for disinfecting solutions.  ? ?HPI: ?Cassandra Benson is a 81 y.o.-year-old female, returning for f/u for LADA, initially dx'ed in 1998 (81 y/o), started insulin at dx, started insulin pump in ~2008, uncontrolled, with complications (CHF) and also Hashimoto's hypothyroidism. She previously saw endocrinology at [facility] (Dr. Janese Banks) and Dr Howell Rucks. Last visit with me 3 months ago. ? ?Interim history: ?She continues to have headaches, muscle, joint aches. ?She also has chronic blurry vision (most in R eye), headaches-on Emgality once a month, Effexor.  Sees neurology.   ?Since last visit, she was hospitalized with progressive shortness of breath after a fall.  Investigation was reassuring. ?She was recently seen in the emergency room for leg swelling.  She tells me that this is when she was diagnosed with congestive heart failure and started on Lasix.  However, reviewing her chart, she has a history of mild decrease in EF. ?Also, she has concerns about [name] increasing her risk of CHF.  She would like to discuss this today. ? ?Reviewed HbA1c levels: ?Lab Results  ?Component Value Date  ? HGBA1C 10.3 (A) 11/09/2021  ? HGBA1C 10.0 (A) 08/02/2021  ? HGBA1C 9.7 (A) 12/03/2020  ? ?She is on: ?- Tresiba 20-22 units in am ... >> 40 >> 46  >> but actually taking 56 units daily ?- Novolog: ?ICR: 1:8 (starches)-1:10 (other foods), but mostly using carb equivalents >> 1:15 (5-15) >> however, it is unclear how she calculates her boluses as she cannot remember the ICR that she uses and she cannot give details about how many carbs she eats per meal ?Target: 130 >> she does not  remember exactly but she thinks she uses 115 ?Insulin sensitivity factor: 20 >> she cannot remember the sensitivity factor ?If you correct sugars at bedtime, only correct >200 and only give 2 to 3 units. ?Use 5 units of NovoLog before coffee - only drinking coffee 2x a week. ?She was previously using Glucophage ER d.a.w. but had to stop because this was not covered by her insurance.  She tried the generic metformin ER and this caused diarrhea so she had to stop. ? ?Insulin pump:  ?-Previously: Medtronic 723-started 09/2016 (changed 07/2017), without CGM.   ?She was using Medtr[employer] for supplies before, but she is then forced by insurance to use [employer].  She came off the pump in the past as she had a lot of problems getting supplies from them. ?-then T:slim x2 - started 2020 ?- stopped since last OV ? ?CGM: ?-Dexcom G6 >> off now ? ?Prev. Pump settings: ?- basal rates: ?12 am: 0.800 units/h >> 0.950 ?3 am: 0.900 >> 1.000 ?9:30 am: 0.650 >> 0.750 ?5 pm: 1.300 ?6:30 pm: 1.200 ?11:30 pm: 1.200 ?- ICR: 1:8 ?- target: 110 ?- ISF:  ?12 am: 20 ?8 pm: 30 ?- Active insulin time: 4h ?TDD from basal insulin: 52% (18 units) >> 55% (18 units) ?TDD from bolus insulin: 48% (17 units) >> 45% (15 units) ?TDD: 35 to 50 units >> 35-50 >> up to 50 units ? ?- extended bolusing: not using ?- changes infusion site: q3 days ? ?She checks her sugars 0-1x a day: ?- am: 150s (115-170) >>  200-300 >> 115, 147-333 >> see above >> 95, 150-260, 296 ?- 2h after b'fast: n/c ?- lunch: 188-239 >> 345 >> n/c >> see above >>180-286, 323 (cereal for dinner) >> ? ?- 2h after lunch: 292 >> n/c >> 181 >> n/c ?- dinner: 85 >> 145-160 >> 175-215 >> 72 >> n/c ?- 2h after dinner: 128, 270 >> n/c ?- bedtime: 255 >> n/c >> 115-175 >> n/c >> ? ?- nighttime: 72 - once a month >> 77 x1 ?Lowest sugar was 45 x1 >> 72 >> 101 >> 45 -2 weeks ago - middle of the day; she has hypoglycemia awareness in the 48s.  No history of hypoglycemia admissions.  She has a glucagon kit  at home. ?Highest sugar was 385 >> 449 >> 323 >> 400s; no history of DKA admissions. ? ?Pt's meals are: ?- Breakfast: protein drink + almond milk - usually skips ?- Lunch: PB jelly sandwich or sandwich with ham or cream cheese sandwich ?- Dinner: salads  ?- Snacks: pretzels; pork tenderloin + veggies; chicken + tenderloin ?No sodas.  She stopped artificial sweeteners (sweet and low) lipids improved. ? ?No CKD, last BUN/creatinine:  ?Lab Results  ?Component Value Date  ? BUN 14 12/27/2021  ? BUN 21 12/24/2021  ? CREATININE 1.09 (H) 12/27/2021  ? CREATININE 0.86 12/24/2021  ?On lisinopril. ? ?+ HL;  last set of lipids: ?Lab Results  ?Component Value Date  ? CHOL 161 10/15/2021  ? HDL 43.30 10/15/2021  ? [place] 94 10/15/2021  ? LDLDIRECT 113.0 09/09/2020  ? TRIG 118.0 10/15/2021  ? CHOLHDL 4 10/15/2021  ?On Crestor 10 -she continues to have muscle cramps despite using CoQ10.  She takes [employer].  Praluent was not covered. ? ?- last eye exam was in 06/2021: No DR, macular degeneration worse - -on Areds She previously saw an ophthalmologist in [place] and would like to establishing care and [place].  ? ?- No numbness and tingling in her feet.  Last foot exam: March 16, 2022. ? ?She was admitted for CP + SOB 10/01/2017.  Cardiac events and PE were ruled out.  She is seeing cardiology (Dr. Rockey Situ). ?She had right carotid ultrasound in 2020 and there is a 39% stenosis, significantly increased since 2018.  She has a history of left carotid endarterectomy. ?She was started on compression hoses by [facility].  These helped. 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 ? ?Hypothyroidism: ?-Due to Hashimoto's thyroiditis ?-Positive family history of Graves' disease in her mother ?-She was initially on Levoxyl, but she thought Levoxyl increased her lipid levels and caused hair loss, so we changed to Synthroid.  She feels better on Synthroid. ? ?She is currently  on Synthroid 75 mcg 6 out of 7 days: ?- in am ?- fasting ?- at least 30 min from b'fast ?- no calcium ?- no iron ?- + multivitamins at night ?- no PPIs ?- not on Biotin ? ?Reviewed her TFTs: ?Lab Results  ?Component Value Date  ? TSH 1.33 10/15/2021  ? TSH 2.16 09/09/2020  ? TSH 1.73 11/27/2019  ? TSH 0.12 (L) 10/07/2019  ? TSH 0.22 (L) 09/10/2019  ? TSH 1.10 11/06/2018  ? TSH 0.09 (L) 08/29/2018  ? TSH 0.92 11/27/2017  ? TSH 0.30 (L) 10/11/2017  ? TSH 0.24 (L) 08/11/2017  ? ?She also has a history of SLE and Sjogren's syndrome.  She has generalized muscle aches and joint pains. ?In 2015, we checked her  hormone levels in the setting of hirsutism and and they were normal.  Free testosterone, LH, FSH, DHEA-S, dexamethasone suppression test and estradiol were all normal.  Also, an AM cortisol and ACTH levels were normal. ?She was admitted 01/15/2020 with SBO. ?She was also diagnosed with mild cognitive impairment and was started on Namenda last year. 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  She had negative biopsies in the past.  Not on steroids.  She is on Lyrica and Effexor.   ?She was in the emergency room for headaches and neck pain on 11/19/2020. ? ?ROS: ?+ See HPI ?Musculoskeletal: + muscle aches/+ joint aches ?Skin: + acne, which she attributes to NovoLog ?Neurological:  + HAs ? ?I reviewed pt's medications, allergies, PMH, social hx, family hx, and changes were documented in the history of present illness. Otherwise, unchanged from my initial visit note. ? ?Past Medical History:  ?Diagnosis Date  ? Acute diverticulitis 04/2021  ? [place] [facility], CT confirmed  ? Allergy   ? ANA positive   ? positive ANA pattern 1 speckled  ? Arthritis   ? Carotid stenosis, asymptomatic 06/19/2015  ? 4-80% RICA 16-55% LICA rpt 1 yr (11/7480)   ? CHF (congestive heart failure) ([place])   ? Colon polyps   ? COVID-19 virus infection 09/14/2021  ? Dermatomyositis ([place])   ? Diabetes mellitus without complication  ([place])   ? Type 1  ? Diverticulosis   ? sigmoid on CT scan 12/2019  ? Family history of adverse reaction to anesthesia   ? brothr went into cardiac arrest from anectine  ? Fibromyalgia   ? prior PCP  ? GER

## 2022-02-08 NOTE — Patient Instructions (Addendum)
Please continue: ?- Tresiba 56 units in am  ? ?Change  Novolog: ?10 units before a small meal ?15 units before a regular meal ?18-20 units before a larger meal ? ?If you try to correct a high blood sugars: ?151-170: +2 units ?171-190: +3 units ?191-210: +4 units ?211-230: +5 units ?231-250: +6 units ?>250: +7 units ?If you correct sugars at bedtime, only correct >200 and only give 2 to 3 units. ? ?Continue Novolog before coffee: 5 units. ? ?Please return in 3 months with your sugar log.   ?

## 2022-02-14 ENCOUNTER — Encounter: Payer: Self-pay | Admitting: Family Medicine

## 2022-02-14 ENCOUNTER — Ambulatory Visit (INDEPENDENT_AMBULATORY_CARE_PROVIDER_SITE_OTHER): Payer: HMO | Admitting: Family Medicine

## 2022-02-14 VITALS — BP 140/66 | HR 94 | Temp 98.0°F | Ht 63.0 in | Wt 152.5 lb

## 2022-02-14 DIAGNOSIS — E139 Other specified diabetes mellitus without complications: Secondary | ICD-10-CM | POA: Diagnosis not present

## 2022-02-14 DIAGNOSIS — R2681 Unsteadiness on feet: Secondary | ICD-10-CM | POA: Diagnosis not present

## 2022-02-14 DIAGNOSIS — R52 Pain, unspecified: Secondary | ICD-10-CM

## 2022-02-14 DIAGNOSIS — R519 Headache, unspecified: Secondary | ICD-10-CM

## 2022-02-14 NOTE — Patient Instructions (Addendum)
Work on getting the [employer] placed. If unable to by your trip, when you return schedule appointment with our pharmacist Mendel Ryder to go over continuous glucose monitor teaching.  Labs today.  Keep August follow up appointment.

## 2022-02-14 NOTE — Progress Notes (Unsigned)
Patient ID: Cassandra Benson, female    DOB: June 18, 1941, 81 y.o.   MRN: [id number]  This visit was conducted in person.  BP 140/66   Pulse 94   Temp 98 F (36.7 C) (Temporal)   Ht _0  (1.6 m)   Wt 152 lb 8 oz (69.2 kg)   SpO2 98%   BMI 27.01 kg/m   Orthostatic VS for the past 24 hrs (Last 3 readings):  BP- Lying BP- Standing at 3 minutes  02/14/22 1431 -- 148/74  02/14/22 1428 136/68 --   Vision Screening   Right eye Left eye Both eyes  Without correction     With correction _1   CC: body aches Subjective:   HPI: Cassandra Benson is a 81 y.o. female presenting on 02/14/2022 for Generalized Body Aches (C/o body aches, blurred vision, HA and dizziness. Sxs started 02/13/22. )   She is excited as her son in [place] recently brought her plane ticket so that she may be able to travel and watch her grandson graduate from high school.  1d h/o body aches, R frontal headache (chronic), blurry vision and dizziness described as unsteadiness imbalance.  No fevers/chills, rhinorrhea, ST, cough or congestion. No bowel changes diarrhea/constipation or nausea.   Notes persistently high readings, recent fasting am 380.   She was also concerned [place] insulin may have been contributing to CHF exacerbation symptoms as well as confusion, dizziness, and low potassium levels. I did discuss I don't think tresiba was contributory to CHF but did recommend she touch base with her endocrinologist about this.   She has upcoming appointment with Dr Patsey Berthold.      Relevant past medical, surgical, family and social history reviewed and updated as indicated. Interim medical history since our last visit reviewed. Allergies and medications reviewed and updated. Outpatient Medications Prior to Visit  Medication Sig Dispense Refill   acetaminophen (TYLENOL) 500 MG tablet Take 1 tablet (500 mg total) by mouth 3 (three) times daily as needed.     Ascorbic Acid (VITAMIN C) 100 MG  tablet Take 100 mg by mouth daily.     BD ULTRA-FINE LANCETS lancets Use as instructed upto 6 times daily 600 each 2   carvedilol (COREG) 3.125 MG tablet Take 1 tablet (3.125 mg total) by mouth 2 (two) times daily with a meal. 60 tablet 6   Cholecalciferol (VITAMIN D3) 25 MCG (1000 UT) CAPS Take 1 capsule (1,000 Units total) by mouth daily. 30 capsule    clopidogrel (PLAVIX) 75 MG tablet TAKE ONE TABLET BY MOUTH EVERY EVENING 30 tablet 6   COMFORT EZ PEN NEEDLES 32G X 4 MM MISC Use 4-5x a day 300 each 3   Continuous Blood Gluc Transmit (DEXCOM G6 TRANSMITTER) MISC 1 kit by Does not apply route See admin instructions. Use to check blood sugar 1 each 1   diclofenac Sodium (VOLTAREN) 1 % GEL Apply 2 g topically 3 (three) times daily. 100 g 3   furosemide (LASIX) 20 MG tablet Take 1 tablet (20 mg total) by mouth daily. 30 tablet 1   Glucagon, rDNA, (GLUCAGON EMERGENCY) 1 MG KIT INJECT into THE muscle ONCE AS NEEDED FOR emergency 1 kit 12   Insulin Aspart FlexPen (NOVOLOG) 100 UNIT/ML USE UP TO 50 UNITS DAILY in insulin pump (Patient taking differently: 10 - 12 units with each meal) 50 mL 0   losartan (COZAAR) 25 MG tablet TAKE ONE TABLET BY MOUTH ONCE DAILY 90 tablet 3  Multiple Vitamin (MULTIVITAMIN ADULT) TABS Take 1 tablet by mouth in the morning and at bedtime.     ONETOUCH ULTRA test strip USE TO check blood glucose FOUR TIMES DAILY 400 strip 2   rosuvastatin (CRESTOR) 20 MG tablet TAKE ONE TABLET BY MOUTH EVERY EVENING 90 tablet 2   SYNTHROID 75 MCG tablet TAKE ONE TABLET BY MOUTH monday-saturday before breakfast 80 tablet 1   topiramate (TOPAMAX) 50 MG tablet Take 1 tablet (50 mg total) by mouth 2 (two) times daily. 60 tablet 12   TRESIBA FLEXTOUCH 100 UNIT/ML FlexTouch Pen Inject 46 Units into the skin daily. 36 mL 1   No facility-administered medications prior to visit.     Per HPI unless specifically indicated in ROS section below Review of Systems  Objective:  BP 140/66   Pulse 94    Temp 98 F (36.7 C) (Temporal)   Ht _0  (1.6 m)   Wt 152 lb 8 oz (69.2 kg)   SpO2 98%   BMI 27.01 kg/m   Wt Readings from Last 3 Encounters:  02/14/22 152 lb 8 oz (69.2 kg)  02/08/22 152 lb 12.8 oz (69.3 kg)  02/01/22 158 lb 4 oz (71.8 kg)      Physical Exam Vitals and nursing note reviewed.  Constitutional:      Appearance: Normal appearance. She is not ill-appearing.  Cardiovascular:     Rate and Rhythm: Normal rate and regular rhythm.     Pulses: Normal pulses.     Heart sounds: Normal heart sounds. No murmur heard. Pulmonary:     Effort: Pulmonary effort is normal. No respiratory distress.     Breath sounds: Normal breath sounds. No wheezing, rhonchi or rales.  Musculoskeletal:     Right lower leg: No edema.     Left lower leg: No edema.  Skin:    General: Skin is warm and dry.     Findings: No rash.  Neurological:     Mental Status: She is alert.  Psychiatric:        Mood and Affect: Mood normal.      Results for orders placed or performed in visit on 02/08/22  POCT glycosylated hemoglobin (Hb A1C)  Result Value Ref Range   Hemoglobin A1C 11.0 (A) 4.0 - 5.6 %   HbA1c POC (<> result, manual entry)     HbA1c, POC (prediabetic range)     HbA1c, POC (controlled diabetic range)     Lab Results  Component Value Date   TSH 1.33 10/15/2021    Lab Results  Component Value Date   CKTOTAL 40 10/15/2021   TROPONINI <0.03 01/27/2018   Assessment & Plan:   Problem List Items Addressed This Visit     LADA (latent autoimmune diabetes in adults), managed as type 1 ([place])    Discussed patient concerns with [name] -I do not think that this medication has caused a CHF exacerbation.  However regarding rather concerned that it may be contributing to her chronic headaches, that may be a possibility.  It would be reasonable to switch insulin brands but I discussed that this would be under endocrinology's guidance.  I encouraged she touch base with Dr. Cruzita Lederer about  this. Also reviewed importance of starting continuous glucose monitoring- she states she will try to get this placed by her upcoming trip to [place], she may have her son in [place] help her, he is a retired Therapist, sports.  Advised if not placed in [place] to schedule appointment with our pharmacist  for CGM teaching.       Intractable episodic headache    Question if [name] contributing to her headaches-see above.       Unsteadiness    Recurrent symptoms for the past 1 to 2 days.  Exam largely reassuring.  Check lab work for further evaluation.  She does feel she stays well-hydrated.       Body aches - Primary    Of 1 day duration associated with headache, blurry vision and unsteadiness.  No respiratory symptoms to suggest viral process.  Unclear cause-check lab work.  She is on statin, however CPK from January was normal.       Relevant Orders   CBC with Differential/Platelet   Comprehensive metabolic panel     No orders of the defined types were placed in this encounter.  Orders Placed This Encounter  Procedures   CBC with Differential/Platelet   Comprehensive metabolic panel    Patient Instructions  Work on getting the DexCom placed. If unable to by your trip, when you return schedule appointment with our pharmacist Mendel Ryder to go over continuous glucose monitor teaching.  Labs today.  Keep August follow up appointment.   Follow up plan: Return in about 3 months (around 05/17/2022) for follow up visit.  [name], MD

## 2022-02-15 ENCOUNTER — Telehealth: Payer: Self-pay

## 2022-02-15 ENCOUNTER — Encounter: Payer: Self-pay | Admitting: Family Medicine

## 2022-02-15 DIAGNOSIS — R52 Pain, unspecified: Secondary | ICD-10-CM | POA: Insufficient documentation

## 2022-02-15 LAB — CBC WITH DIFFERENTIAL/PLATELET
Basophils Absolute: 0.1 10*3/uL (ref 0.0–0.1)
Basophils Relative: 1 % (ref 0.0–3.0)
Eosinophils Absolute: 0.1 10*3/uL (ref 0.0–0.7)
Eosinophils Relative: 0.8 % (ref 0.0–5.0)
HCT: 39.4 % (ref 36.0–46.0)
Hemoglobin: 13 g/dL (ref 12.0–15.0)
Lymphocytes Relative: 25.6 % (ref 12.0–46.0)
Lymphs Abs: 1.8 10*3/uL (ref 0.7–4.0)
MCHC: 33 g/dL (ref 30.0–36.0)
MCV: 82.2 fl (ref 78.0–100.0)
Monocytes Absolute: 0.4 10*3/uL (ref 0.1–1.0)
Monocytes Relative: 6 % (ref 3.0–12.0)
Neutro Abs: 4.7 10*3/uL (ref 1.4–7.7)
Neutrophils Relative %: 66.6 % (ref 43.0–77.0)
Platelets: 177 10*3/uL (ref 150.0–400.0)
RBC: 4.79 Mil/uL (ref 3.87–5.11)
RDW: 14.5 % (ref 11.5–15.5)
WBC: 7.1 10*3/uL (ref 4.0–10.5)

## 2022-02-15 LAB — COMPREHENSIVE METABOLIC PANEL
ALT: 12 U/L (ref 0–35)
AST: 14 U/L (ref 0–37)
Albumin: 3.9 g/dL (ref 3.5–5.2)
Alkaline Phosphatase: 77 U/L (ref 39–117)
BUN: 13 mg/dL (ref 6–23)
CO2: 26 mEq/L (ref 19–32)
Calcium: 9.4 mg/dL (ref 8.4–10.5)
Chloride: 103 mEq/L (ref 96–112)
Creatinine, Ser: 1.01 mg/dL (ref 0.40–1.20)
GFR: 52.9 mL/min — ABNORMAL LOW (ref >60.00)
Glucose, Bld: 482 mg/dL — ABNORMAL HIGH (ref 70–99)
Potassium: 4 mEq/L (ref 3.5–5.1)
Sodium: 137 mEq/L (ref 135–145)
Total Bilirubin: 0.3 mg/dL (ref 0.2–1.2)
Total Protein: 6.4 g/dL (ref 6.0–8.3)

## 2022-02-15 NOTE — Chronic Care Management (AMB) (Signed)
Chronic Care Management Pharmacy Assistant   Name: Cassandra Benson  MRN: [id number] DOB: 1941-06-30   Reason for Encounter: Medication Adherence and Delivery Coordination    Recent office visits:  02/14/22-Cassandra Gutierrez,MD(PCP)- acute HA,dizziness, ache,Labs ordered(pendng sign off )  decom discussed,f/u 3 mon.s no med changes  02/01/22-Cassandra Gutierrez,MD(PCP)-hospital f/u    Ongoing significant dyspnea even with minimal exertion f/u pulmonary specialist, follow up 3 months , no med changes     Recent consult visits:  02/08/22-Cassandra Gherghe,MD(endo)-f/u LADA-change novolog to 10 units before small meal,15 before regular meal,18-20 before large meal-If you correct sugars at bedtime, only correct >200 and only give 2 to 3 units.Continue Novolog before coffee: 5 units. F/u 3 months with BG log 01/31/22-[facility]- weigh daily and call if >3 -5 lbs weekly  follow up 6 months   Hospital visits:  None in previous 6 months  Medications: Outpatient Encounter Medications as of 02/15/2022  Medication Sig   acetaminophen (TYLENOL) 500 MG tablet Take 1 tablet (500 mg total) by mouth 3 (three) times daily as needed.   Ascorbic Acid (VITAMIN C) 100 MG tablet Take 100 mg by mouth daily.   BD ULTRA-FINE LANCETS lancets Use as instructed upto 6 times daily   carvedilol (COREG) 3.125 MG tablet Take 1 tablet (3.125 mg total) by mouth 2 (two) times daily with a meal.   Cholecalciferol (VITAMIN D3) 25 MCG (1000 UT) CAPS Take 1 capsule (1,000 Units total) by mouth daily.   clopidogrel (PLAVIX) 75 MG tablet TAKE ONE TABLET BY MOUTH EVERY EVENING   COMFORT EZ PEN NEEDLES 32G X 4 MM MISC Use 4-5x a day   Continuous Blood Gluc Transmit (DEXCOM G6 TRANSMITTER) MISC 1 kit by Does not apply route See admin instructions. Use to check blood sugar   diclofenac Sodium (VOLTAREN) 1 % GEL Apply 2 g topically 3 (three) times daily.   furosemide (LASIX) 20 MG tablet Take 1 tablet (20 mg total)  by mouth daily.   Glucagon, rDNA, (GLUCAGON EMERGENCY) 1 MG KIT INJECT into THE muscle ONCE AS NEEDED FOR emergency   Insulin Aspart FlexPen (NOVOLOG) 100 UNIT/ML USE UP TO 50 UNITS DAILY in insulin pump (Patient taking differently: 10 - 12 units with each meal)   losartan (COZAAR) 25 MG tablet TAKE ONE TABLET BY MOUTH ONCE DAILY   Multiple Vitamin (MULTIVITAMIN ADULT) TABS Take 1 tablet by mouth in the morning and at bedtime.   ONETOUCH ULTRA test strip USE TO check blood glucose FOUR TIMES DAILY   rosuvastatin (CRESTOR) 20 MG tablet TAKE ONE TABLET BY MOUTH EVERY EVENING   SYNTHROID 75 MCG tablet TAKE ONE TABLET BY MOUTH monday-saturday before breakfast   topiramate (TOPAMAX) 50 MG tablet Take 1 tablet (50 mg total) by mouth 2 (two) times daily.   TRESIBA FLEXTOUCH 100 UNIT/ML FlexTouch Pen Inject 46 Units into the skin daily.   No facility-administered encounter medications on file as of 02/15/2022.   BP Readings from Last 3 Encounters:  02/14/22 140/66  02/08/22 120/72  02/01/22 124/70    Lab Results  Component Value Date   HGBA1C 11.0 (A) 02/08/2022    Recent OV, Consult or Hospital visit:  - Tresiba 56 units in am   Recent medication changes indicated: Change  Novolog:      10 units before a small meal      15 units before a regular meal        18-20 units before a larger meal  Last adherence delivery date:01/25/22      Patient is due for next adherence delivery on: 02/24/22  Spoke with patient on 02/15/22 reviewed medications and coordinated delivery.  This delivery to include: Vials  30 Days  easy open VIAL medications: [name] - Use as directed  Synthroid 75 mcg - 1 tablet daily Monday-Saturday (before breakfast) [name] - 1 tablet every evening  Furosemide 74m - take 1 tablet daily  Carvedilol 3.1267m-take 1 tablet twice daily  Rosuvastatin 2062mtake 1 tablet evening  Patient declined the following medications this month: Glucagon 1mg42mnjection - use as needed- patient has supply on hand  Trueplus pen needle 32g 4mm-58mtient has supply on hand  Tresiba Flextouch U-100 - Inject 56 units daily- patient has 1 unopened box remaining Topiramate 50mg-34me 1 tablet 2 times daily- patient has 2 bottles remaining  Losartan 25 mg- take 1 tablet daily - patient has 27 pills remaining  [name]- inject 5 units pre coffee and 10-20 units before meals -patient has 1 unopened box remaining   Any concerns about your medications? No- she still has 1 month supply of several medications , we reviewed all medications   How often do you forget or accidentally miss a dose? Never  Do you use a pillbox? Yes  Refills requested from providers include:  furosemide   Confirmed delivery date of 02/24/22, advised patient that pharmacy will contact them the morning of delivery.  Recent blood pressure readings are as follows:none available    Recent blood glucose readings are as follows: Fasting: 02/15/22- 90     02/14/22- 260 Patient only had a salad at 6pm night before- BG's run high and Dr.G aware          Annual wellness visit in last year? Yes Most Recent BP reading:140/66  If Diabetic: Most recent A1C reading:11.0   02/08/22 Last eye exam / retinopathy screening: UTD Last diabetic foot exam: UTD  LindseCharlene Brookenotified      CCM 04/13/22  VelmenAvel Benson  [phone]

## 2022-02-15 NOTE — Assessment & Plan Note (Addendum)
Of 1 day duration associated with headache, blurry vision and unsteadiness.  No respiratory symptoms to suggest viral process.  Unclear cause-check lab work.  She is on statin, however CPK from January was normal.

## 2022-02-15 NOTE — Assessment & Plan Note (Addendum)
Discussed patient concerns with [name] -I do not think that this medication has caused a CHF exacerbation.  However regarding rather concerned that it may be contributing to her chronic headaches, that may be a possibility.  It would be reasonable to switch insulin brands but I discussed that this would be under endocrinology's guidance.  I encouraged she touch base with Dr. Cruzita Lederer about this. Also reviewed importance of starting continuous glucose monitoring- she states she will try to get this placed by her upcoming trip to [place], she may have her son in [place] help her, he is a retired Therapist, sports.  Advised if not placed in [place] to schedule appointment with our pharmacist for CGM teaching.

## 2022-02-15 NOTE — Assessment & Plan Note (Signed)
Recurrent symptoms for the past 1 to 2 days.  Exam largely reassuring.  Check lab work for further evaluation.  She does feel she stays well-hydrated.

## 2022-02-15 NOTE — Assessment & Plan Note (Signed)
Question if Cassandra Benson contributing to her headaches-see above.

## 2022-02-16 ENCOUNTER — Encounter: Payer: Self-pay | Admitting: Family Medicine

## 2022-02-16 NOTE — Telephone Encounter (Signed)
Replied via mychart result - I asked if we needed to send new DexCom 6 Rx to pharmacy.

## 2022-02-16 NOTE — Telephone Encounter (Signed)
Replied via mychart result.

## 2022-02-18 ENCOUNTER — Ambulatory Visit: Payer: PPO | Admitting: Medical

## 2022-02-20 ENCOUNTER — Encounter: Payer: Self-pay | Admitting: Family Medicine

## 2022-02-25 ENCOUNTER — Ambulatory Visit (INDEPENDENT_AMBULATORY_CARE_PROVIDER_SITE_OTHER): Payer: HMO | Admitting: Family Medicine

## 2022-02-25 ENCOUNTER — Telehealth: Payer: Self-pay | Admitting: Family Medicine

## 2022-02-25 ENCOUNTER — Encounter: Payer: Self-pay | Admitting: Family Medicine

## 2022-02-25 VITALS — BP 160/72 | HR 95 | Temp 97.6°F | Ht 63.0 in | Wt 151.4 lb

## 2022-02-25 DIAGNOSIS — E139 Other specified diabetes mellitus without complications: Secondary | ICD-10-CM

## 2022-02-25 DIAGNOSIS — R300 Dysuria: Secondary | ICD-10-CM | POA: Insufficient documentation

## 2022-02-25 DIAGNOSIS — I10 Essential (primary) hypertension: Secondary | ICD-10-CM

## 2022-02-25 LAB — POC URINALSYSI DIPSTICK (AUTOMATED)
Bilirubin, UA: NEGATIVE
Blood, UA: NEGATIVE
Glucose, UA: POSITIVE — AB
Ketones, UA: NEGATIVE
Nitrite, UA: NEGATIVE
Protein, UA: NEGATIVE
Spec Grav, UA: 1.01 (ref 1.010–1.025)
Urobilinogen, UA: 0.2 E.U./dL
pH, UA: 6 (ref 5.0–8.0)

## 2022-02-25 NOTE — Telephone Encounter (Signed)
Spoke with pt offering 4:00 OV today.  Pt agrees and will be here by 3:45.  I notified pt to keep her urine sample refrigerated until appt today and confirmed sample is a clean catch. Pt states it is.     Will have Amy add pt to schedule.

## 2022-02-25 NOTE — Assessment & Plan Note (Signed)
Forgot to recheck BP - she is on low dose carvedilol, losartan, lasix. See phone note.

## 2022-02-25 NOTE — Telephone Encounter (Addendum)
Forgot to recheck BP - ensure she continues on low dose carvedilol 3.'125mg'$  BID, losartan '25mg'$  daily, lasix '20mg'$  daily.  Plz have her monitor BP at home and if staying elevated to let us know to titrate meds.

## 2022-02-25 NOTE — Patient Instructions (Addendum)
Given UTI symptoms, start macrobid twice daily for 7 days.  I've sent urine for culture.  Continue drinking plenty of water.  Avoid bladder irritants like caffeine, dark sodas and spicy foods.   Urinary Tract Infection, Adult  A urinary tract infection (UTI) is an infection of any part of the urinary tract. The urinary tract includes the kidneys, ureters, bladder, and urethra. These organs make, store, and get rid of urine in the body. An upper UTI affects the ureters and kidneys. A lower UTI affects the bladder and urethra. What are the causes? Most urinary tract infections are caused by bacteria in your genital area around your urethra, where urine leaves your body. These bacteria grow and cause inflammation of your urinary tract. What increases the risk? You are more likely to develop this condition if: You have a urinary catheter that stays in place. You are not able to control when you urinate or have a bowel movement (incontinence). You are female and you: Use a spermicide or diaphragm for birth control. Have low estrogen levels. Are pregnant. You have certain genes that increase your risk. You are sexually active. You take antibiotic medicines. You have a condition that causes your flow of urine to slow down, such as: An enlarged prostate, if you are female. Blockage in your urethra. A kidney stone. A nerve condition that affects your bladder control (neurogenic bladder). Not getting enough to drink, or not urinating often. You have certain medical conditions, such as: Diabetes. A weak disease-fighting system (immunesystem). Sickle cell disease. Gout. Spinal cord injury. What are the signs or symptoms? Symptoms of this condition include: Needing to urinate right away (urgency). Frequent urination. This may include small amounts of urine each time you urinate. Pain or burning with urination. Blood in the urine. Urine that smells bad or unusual. Trouble urinating. Cloudy  urine. Vaginal discharge, if you are female. Pain in the abdomen or the lower back. You may also have: Vomiting or a decreased appetite. Confusion. Irritability or tiredness. A fever or chills. Diarrhea. The first symptom in older adults may be confusion. In some cases, they may not have any symptoms until the infection has worsened. How is this diagnosed? This condition is diagnosed based on your medical history and a physical exam. You may also have other tests, including: Urine tests. Blood tests. Tests for STIs (sexually transmitted infections). If you have had more than one UTI, a cystoscopy or imaging studies may be done to determine the cause of the infections. How is this treated? Treatment for this condition includes: Antibiotic medicine. Over-the-counter medicines to treat discomfort. Drinking enough water to stay hydrated. If you have frequent infections or have other conditions such as a kidney stone, you may need to see a health care provider who specializes in the urinary tract (urologist). In rare cases, urinary tract infections can cause sepsis. Sepsis is a life-threatening condition that occurs when the body responds to an infection. Sepsis is treated in the hospital with IV antibiotics, fluids, and other medicines. Follow these instructions at home:  Medicines Take over-the-counter and prescription medicines only as told by your health care provider. If you were prescribed an antibiotic medicine, take it as told by your health care provider. Do not stop using the antibiotic even if you start to feel better. General instructions Make sure you: Empty your bladder often and completely. Do not hold urine for long periods of time. Empty your bladder after sex. Wipe from front to back after urinating or having  a bowel movement if you are female. Use each tissue only one time when you wipe. Drink enough fluid to keep your urine pale yellow. Keep all follow-up visits. This  is important. Contact a health care provider if: Your symptoms do not get better after 1-2 days. Your symptoms go away and then return. Get help right away if: You have severe pain in your back or your lower abdomen. You have a fever or chills. You have nausea or vomiting. Summary A urinary tract infection (UTI) is an infection of any part of the urinary tract, which includes the kidneys, ureters, bladder, and urethra. Most urinary tract infections are caused by bacteria in your genital area. Treatment for this condition often includes antibiotic medicines. If you were prescribed an antibiotic medicine, take it as told by your health care provider. Do not stop using the antibiotic even if you start to feel better. Keep all follow-up visits. This is important. This information is not intended to replace advice given to you by your health care provider. Make sure you discuss any questions you have with your health care provider. Document Revised: 04/24/2020 Document Reviewed: 04/24/2020 Elsevier Patient Education  [place].

## 2022-02-25 NOTE — Telephone Encounter (Signed)
Attempted to contact pt. No answer on home/cell #'s.  Sending Dr. Synthia Innocent message to pt via [facility].

## 2022-02-25 NOTE — Assessment & Plan Note (Addendum)
Symptoms suspicious for UTI - UA/micro largely ok but did show WBC present. Given symptoms, will start treatment with macrobid 1 wk course given allergies to PCN and sulfa.  Avoid bladder irritants.  UTI handout provided.  Update if not improving with treatment.

## 2022-02-25 NOTE — Progress Notes (Addendum)
Patient ID: Cassandra Benson, female    DOB: [birth year], 81 y.o.   MRN: [id number]  This visit was conducted in person.  BP (!) 160/72 (BP Location: Right Arm, Cuff Size: Normal)   Pulse 95   Temp 97.6 F (36.4 C) (Temporal)   Ht _0  (1.6 m)   Wt 151 lb 6 oz (68.7 kg)   SpO2 99%   BMI 26.81 kg/m   BP Readings from Last 3 Encounters:  02/25/22 (!) 160/72  02/14/22 140/66  02/08/22 120/72   CC: UTI symptoms  Subjective:   HPI: Cassandra Benson is a 81 y.o. female presenting on 02/25/2022 for Dysuria (C/o pain with urination and frequency.  Sxs started  02/23/22. )   3d h/o malaise, dysuria, frequency, urgency that started after returning from trip to [facility] to see grandson graduating from [place]. Notes headache and blurry vision, feeling cold. Some lower back pain.  She stays well hydrated with plenty of water.  No fevers, hematuria, nausea or abdominal pain or flank.   No recent UTIs.   Diabetic followed by endocrinology with recent elevated readings. Recent sugars have been great - 122 today.      Relevant past medical, surgical, family and social history reviewed and updated as indicated. Interim medical history since our last visit reviewed. Allergies and medications reviewed and updated. Outpatient Medications Prior to Visit  Medication Sig Dispense Refill   acetaminophen (TYLENOL) 500 MG tablet Take 1 tablet (500 mg total) by mouth 3 (three) times daily as needed.     Ascorbic Acid (VITAMIN C) 100 MG tablet Take 100 mg by mouth daily.     BD ULTRA-FINE LANCETS lancets Use as instructed upto 6 times daily 600 each 2   carvedilol (COREG) 3.125 MG tablet Take 1 tablet (3.125 mg total) by mouth 2 (two) times daily with a meal. 60 tablet 6   Cholecalciferol (VITAMIN D3) 25 MCG (1000 UT) CAPS Take 1 capsule (1,000 Units total) by mouth daily. 30 capsule    clopidogrel (PLAVIX) 75 MG tablet TAKE ONE TABLET BY MOUTH EVERY EVENING 30 tablet 6   COMFORT EZ PEN NEEDLES 32G  X 4 MM MISC Use 4-5x a day 300 each 3   Continuous Blood Gluc Transmit (DEXCOM G6 TRANSMITTER) MISC 1 kit by Does not apply route See admin instructions. Use to check blood sugar 1 each 1   diclofenac Sodium (VOLTAREN) 1 % GEL Apply 2 g topically 3 (three) times daily. 100 g 3   furosemide (LASIX) 20 MG tablet Take 1 tablet (20 mg total) by mouth daily. 30 tablet 1   Glucagon, rDNA, (GLUCAGON EMERGENCY) 1 MG KIT INJECT into THE muscle ONCE AS NEEDED FOR emergency 1 kit 12   Insulin Aspart FlexPen (NOVOLOG) 100 UNIT/ML USE UP TO 50 UNITS DAILY in insulin pump (Patient taking differently: 10 - 12 units with each meal) 50 mL 0   losartan (COZAAR) 25 MG tablet TAKE ONE TABLET BY MOUTH ONCE DAILY 90 tablet 3   Multiple Vitamin (MULTIVITAMIN ADULT) TABS Take 1 tablet by mouth in the morning and at bedtime.     ONETOUCH ULTRA test strip USE TO check blood glucose FOUR TIMES DAILY 400 strip 2   rosuvastatin (CRESTOR) 20 MG tablet TAKE ONE TABLET BY MOUTH EVERY EVENING 90 tablet 2   SYNTHROID 75 MCG tablet TAKE ONE TABLET BY MOUTH monday-saturday before breakfast 80 tablet 1   topiramate (TOPAMAX) 50 MG tablet Take 1 tablet (50 mg  total) by mouth 2 (two) times daily. 60 tablet 12   TRESIBA FLEXTOUCH 100 UNIT/ML FlexTouch Pen Inject 46 Units into the skin daily. 36 mL 1   No facility-administered medications prior to visit.     Per HPI unless specifically indicated in ROS section below Review of Systems  Objective:  BP (!) 160/72 (BP Location: Right Arm, Cuff Size: Normal)   Pulse 95   Temp 97.6 F (36.4 C) (Temporal)   Ht _0  (1.6 m)   Wt 151 lb 6 oz (68.7 kg)   SpO2 99%   BMI 26.81 kg/m   Wt Readings from Last 3 Encounters:  02/25/22 151 lb 6 oz (68.7 kg)  02/14/22 152 lb 8 oz (69.2 kg)  02/08/22 152 lb 12.8 oz (69.3 kg)      Physical Exam Vitals and nursing note reviewed.  Constitutional:      Appearance: Normal appearance. She is not ill-appearing.  Cardiovascular:     Rate and  Rhythm: Normal rate and regular rhythm.     Pulses: Normal pulses.     Heart sounds: Normal heart sounds. No murmur heard. Pulmonary:     Effort: Pulmonary effort is normal. No respiratory distress.     Breath sounds: Normal breath sounds. No wheezing, rhonchi or rales.  Abdominal:     General: Abdomen is flat. Bowel sounds are normal. There is no distension.     Palpations: Abdomen is soft. There is no mass.     Tenderness: There is abdominal tenderness (mild) in the right upper quadrant and suprapubic area. There is no right CVA tenderness, left CVA tenderness, guarding or rebound. Negative signs include Murphy's sign.     Hernia: No hernia is present.  Neurological:     Mental Status: She is alert.  Psychiatric:        Behavior: Behavior normal.      Results for orders placed or performed in visit on 02/25/22  POCT Urinalysis Dipstick (Automated)  Result Value Ref Range   Color, UA yellow    Clarity, UA clear    Glucose, UA Positive (A) Negative   Bilirubin, UA negative    Ketones, UA negative    Spec Grav, UA 1.010 1.010 - 1.025   Blood, UA negative    pH, UA 6.0 5.0 - 8.0   Protein, UA Negative Negative   Urobilinogen, UA 0.2 0.2 or 1.0 E.U./dL   Nitrite, UA negative    Leukocytes, UA Small (1+) (A) Negative    Assessment & Plan:   Problem List Items Addressed This Visit     Essential hypertension    Forgot to recheck BP - she is on low dose carvedilol, losartan, lasix. See phone note.        LADA (latent autoimmune diabetes in adults), managed as type 1 ([place])   Dysuria - Primary    Symptoms suspicious for UTI - UA/micro largely ok but did show WBC present. Given symptoms, will start treatment with macrobid 1 wk course given allergies to PCN and sulfa.  Avoid bladder irritants.  UTI handout provided.  Update if not improving with treatment.        Relevant Orders   POCT Urinalysis Dipstick (Automated) (Completed)   Urine Culture     No orders of the  defined types were placed in this encounter.  Orders Placed This Encounter  Procedures   Urine Culture   POCT Urinalysis Dipstick (Automated)     Patient instructions: Given UTI symptoms, start macrobid twice  daily for 7 days.  I've sent urine for culture.  Continue drinking plenty of water.  Avoid bladder irritants like caffeine, dark sodas and spicy foods.   Follow up plan: Return if symptoms worsen or fail to improve.  [name], MD

## 2022-02-25 NOTE — Telephone Encounter (Addendum)
Can we schedule her at 4pm and/or have her drop off urine sample to run UA? Ensure urine was recently collected or she's placed in refrigerator. If not, she should just recollect when she comes in.

## 2022-02-25 NOTE — Telephone Encounter (Signed)
Pt called and said she just got back in town from [place] Wednesday night and she said she started feeling bad yesterday and she woke up today with what she believes is a UTI. She said its painful, and shes having frequency. She said she did collect a clean specimen as well. We dont have anything at this office today so I offered for another office but she didn't want to drive to another office and said she will just either wait until Monday with [place] or if she gets worse she said she will go to the hospital. What do you want her to do? Call back is [phone]

## 2022-02-26 LAB — URINE CULTURE
MICRO NUMBER:: 13476502
SPECIMEN QUALITY:: ADEQUATE

## 2022-02-26 MED ORDER — NITROFURANTOIN MONOHYD MACRO 100 MG PO CAPS: 100.0000 mg | ORAL_CAPSULE | Freq: Two times a day (BID) | ORAL | 0 refills | Status: DC

## 2022-02-26 NOTE — Addendum Note (Signed)
Addended by: [name] on: 02/26/2022 11:02 AM   Modules accepted: Orders

## 2022-02-28 ENCOUNTER — Ambulatory Visit: Payer: HMO | Admitting: Family Medicine

## 2022-02-28 ENCOUNTER — Encounter: Payer: Self-pay | Admitting: Family Medicine

## 2022-03-01 ENCOUNTER — Telehealth: Payer: Self-pay

## 2022-03-01 NOTE — Telephone Encounter (Signed)
I don't do insulin pumps - this needs to go to endo Dr Cruzita Lederer.

## 2022-03-01 NOTE — Telephone Encounter (Signed)
Received faxed order from [employer] [facility] ([place]) for insulin pump supplies.  Placed form in Dr. Synthia Innocent box.

## 2022-03-02 NOTE — Telephone Encounter (Signed)
Noted  

## 2022-03-07 ENCOUNTER — Encounter: Payer: Self-pay | Admitting: Family Medicine

## 2022-03-08 ENCOUNTER — Ambulatory Visit
Admission: RE | Admit: 2022-03-08 | Discharge: 2022-03-08 | Disposition: A | Discharge status: Home / Self Care | Payer: HMO | Source: Ambulatory Visit | Attending: Family Medicine | Admitting: Family Medicine

## 2022-03-08 DIAGNOSIS — Z1231 Encounter for screening mammogram for malignant neoplasm of breast: Secondary | ICD-10-CM | POA: Diagnosis not present

## 2022-03-09 ENCOUNTER — Ambulatory Visit: Payer: PPO | Admitting: Family

## 2022-03-09 ENCOUNTER — Encounter: Payer: Self-pay | Admitting: Family Medicine

## 2022-03-10 ENCOUNTER — Telehealth: Payer: Self-pay

## 2022-03-10 NOTE — Telephone Encounter (Signed)
Patient reports that she has been feeling "really awful" this week. She reports fatigue, shortness of breath, and swelling around her ankles. She is unsure if she's had weight gain as she has not been checking her weight regularly.  Informed patient that our office will be closed for the next week, but that the patient has a scheduled appt with Cardiology on 03/15/22.  Per [facility], advised the patient to increase her 20 MG furosemide daily to 40 MG for today and tomorrow to attempt to remove excess fluid. She was able to state back that she will take 2 furosemide tablets today and tomorrow equaling 40 MG daily for the next two days and will then resume 20 MG/day and will follow up with Dr. Rockey Situ (Cardiology) on 6/20.  Georg Ruddle, RN

## 2022-03-14 ENCOUNTER — Other Ambulatory Visit: Payer: Self-pay | Admitting: Vascular Surgery

## 2022-03-14 ENCOUNTER — Other Ambulatory Visit: Payer: Self-pay | Admitting: Family Medicine

## 2022-03-14 NOTE — Progress Notes (Unsigned)
Cardiology Office Note  Date:  03/15/2022   ID:  Cassandra Benson, Cassandra Benson 05/07/1941, MRN [id number]  PCP:  [name], MD   Chief Complaint  Patient presents with   1-2 month follow up     Patient c/o shortness of breath with walking, right leg swelling, chest pain, hoarseness and rapid heart beats. Medications reviewed by the patient verbally.     HPI:  Cassandra Benson is a very pleasant 81 year old woman with  Chronic chest pain 15 years of diabetes, on insulin,  Hyperlipidemia, prior episodes of chest pain,  80% stenosis of the left internal carotid artery.  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 CT scan: Aortic atherosclerosis, no significant coronary calcification noted Prior cardiac catheterization 12-11-98 no significant disease Poorly controlled diabetes Cardiac CTA 2/23: no CAD Who presents for routine follow-up of recent episodes of chest pain  Last seen in clinic 2/23 Developed chest pain at that time Cardiac CTA ordered, calcium score 0, no significant coronary disease  Significant recent stressors Lost grand son-suicide, lived in [place]  Rare spasm in chest, "stabbing" 2-4 x a week Workup for chest pain Cardiac CTA 2/23: no CAD  Labs reviewed A1C 11, up from 10 on prednisone, Slow to come down since then Total chol 161, LDL 94  EKG personally reviewed by myself on todays visit NSR rate 78 bpm, no St or T wave changes  Other past medical history reviewed emergency room March 2022 for chest pain, shoulder pain felt to be atypical in nature Atypical pain --CTA chest showed no PE or other acute abnormality.   stressful 12-12-2015 husband died in December 12, 2015, cancer Was diagnosed late spring, died several months later from metastatic disease lonely at nighttime  Previous episodes of chest pain,  woke up with some chest discomfort.  Went  to the emergency room, cardiac enzymes negative 3,  chest CT with no PE.   She does report prior cardiac catheterization in Dec 11, 1998 showing no significant disease CT scan done 04/2015 in the emergency room shows no coronary calcifications, minimal minimal descending aorta atherosclerosis    PMH:   has a past medical history of Acute diverticulitis (04/2021), Allergy, ANA positive, Arthritis, Carotid stenosis, asymptomatic (06/19/2015), CHF (congestive heart failure) ([place]), Colon polyps, COVID-19 virus infection (09/14/2021), Dermatomyositis ([place]), Diabetes mellitus without complication ([place]), Diverticulosis, Family history of adverse reaction to anesthesia, Fibromyalgia, GERD (gastroesophageal reflux disease), Glaucoma, History of blood clots, History of chicken pox, History of diverticulitis, History of pericarditis 12/11/84), History of pneumonia December 11, 2012), History of shingles, History of UTI, Hyperlipidemia, Hypertension, Hypothyroidism, Mixed connective tissue disease ([place]), Partial small bowel obstruction ([place]) (12/2019), Peptic ulcer, Pneumonia, PONV (postoperative nausea and vomiting), Raynaud's disease without gangrene, Shoulder pain (left), Sjogren's syndrome ([place]), Sleep apnea, 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[place]), and Vitamin D deficiency.  PSH:    Past Surgical History:  Procedure Laterality Date   ABDOMINAL HYSTERECTOMY  1978   fibroids and menorrhagia, ovaries remain   ARTERY BIOPSY Right 04/06/2018   Procedure: BIOPSY TEMPORAL ARTERY RIGHT;  Surgeon: Beverly Gust, MD;  Location: [facility];  Service: ENT;  Laterality: Right;  Diabetic - insulin pump sleep apnea   [facility] normal per patient   COLONOSCOPY  12/12/11   1 TA, 1 HP, very tortuous colon ([place])   COLONOSCOPY  02/2020   TA, inflammatory polyp, mod diverticulosis, int/ext hemorrhoids ([place]) no  rpt recommended    COLONOSCOPY WITH ESOPHAGOGASTRODUODENOSCOPY (EGD)  03/2007   2 ulcers, benign  polyp, rpt 5 yrs [facility])   JOINT REPLACEMENT Right    hip   PARTIAL HIP ARTHROPLASTY  2013   Right hip replacement   TONSILLECTOMY     TONSILLECTOMY AND ADENOIDECTOMY     TRANSCAROTID ARTERY REVASCULARIZATION  Left 07/23/2018   Procedure: TRANSCAROTID ARTERY REVASCULARIZATION;  Surgeon: Marty Heck, MD;  Location: [facility];  Service: Vascular;  Laterality: Left;   TUBAL LIGATION     VAGINAL DELIVERY     x2, no complications    Current Outpatient Medications  Medication Sig Dispense Refill   acetaminophen (TYLENOL) 500 MG tablet Take 1 tablet (500 mg total) by mouth 3 (three) times daily as needed.     Ascorbic Acid (VITAMIN C) 100 MG tablet Take 100 mg by mouth daily.     BD ULTRA-FINE LANCETS lancets Use as instructed upto 6 times daily 600 each 2   Cholecalciferol (VITAMIN D3) 25 MCG (1000 UT) CAPS Take 1 capsule (1,000 Units total) by mouth daily. 30 capsule    clopidogrel (PLAVIX) 75 MG tablet TAKE ONE TABLET BY MOUTH EVERY EVENING 30 tablet 6   COMFORT EZ PEN NEEDLES 32G X 4 MM MISC Use 4-5x a day 300 each 3   Continuous Blood Gluc Transmit (DEXCOM G6 TRANSMITTER) MISC 1 kit by Does not apply route See admin instructions. Use to check blood sugar 1 each 1   diclofenac Sodium (VOLTAREN) 1 % GEL Apply 2 g topically 3 (three) times daily. 100 g 3   furosemide (LASIX) 20 MG tablet Take 1 tablet (20 mg total) by mouth daily. 30 tablet 1   Glucagon, rDNA, (GLUCAGON EMERGENCY) 1 MG KIT INJECT into THE muscle ONCE AS NEEDED FOR emergency 1 kit 12   Insulin Aspart FlexPen (NOVOLOG) 100 UNIT/ML USE UP TO 50 UNITS DAILY in insulin pump (Patient taking differently: 10 - 12 units with each meal) 50 mL 0   Multiple Vitamin (MULTIVITAMIN ADULT) TABS Take 1 tablet by mouth in the morning and at bedtime.     ONETOUCH ULTRA test strip USE TO check blood glucose FOUR TIMES DAILY 400 strip 2   rosuvastatin (CRESTOR) 20 MG tablet TAKE ONE TABLET BY MOUTH EVERY EVENING 90 tablet  2   SYNTHROID 75 MCG tablet TAKE ONE TABLET BY MOUTH Monday-Saturday BEFORE breakfast 80 tablet 1   topiramate (TOPAMAX) 50 MG tablet Take 1 tablet (50 mg total) by mouth 2 (two) times daily. 60 tablet 12   TRESIBA FLEXTOUCH 100 UNIT/ML FlexTouch Pen Inject 46 Units into the skin daily. 36 mL 1   carvedilol (COREG) 6.25 MG tablet Take 1 tablet (6.25 mg total) by mouth 2 (two) times daily with a meal. 90 tablet 3   losartan (COZAAR) 25 MG tablet Take 1 tablet (25 mg total) by mouth daily. 90 tablet 3   nitrofurantoin, macrocrystal-monohydrate, (MACROBID) 100 MG capsule Take 1 capsule (100 mg total) by mouth 2 (two) times daily. (Patient not taking: Reported on 03/15/2022) 14 capsule 0   No current facility-administered medications for this visit.    Allergies:   Iodinated contrast media, Penicillins, Amlodipine, Anectine [succinylcholine], Codeine, Gabapentin, Influenza vaccines, Nortriptyline, Pamelor [nortriptyline hcl], Valsartan, Zetia [ezetimibe], Erythromycin, and Sulfa antibiotics   Social History:  The patient  reports that she has never smoked. She has never used smokeless tobacco. She reports that she does not drink alcohol and does not use drugs.  Family History:   family history includes Alcohol abuse in her brother; Anuerysm in her sister; Breast cancer in her maternal aunt, maternal aunt, and sister; CAD in her sister; CAD (age of onset: 76) in her brother; CAD (age of onset: 37) in her brother; CAD (age of onset: 54) in her father and mother; COPD in her brother and mother; Depression in her grandchild; Diabetes in her brother, sister, and sister; Gallbladder disease in her maternal grandmother; [name] in her mother; Hypertension in her maternal grandmother; Lupus in her mother and sister; Rheum arthritis in her mother; Seizures in her son; Stroke in her brother, maternal grandmother, and sister.    Review of Systems: Review of Systems  Constitutional:  Positive for  malaise/fatigue.  HENT: Negative.    Respiratory: Negative.    Cardiovascular: Negative.   Gastrointestinal: Negative.   Musculoskeletal: Negative.   Neurological:  Positive for headaches.  Psychiatric/Behavioral: Negative.    All other systems reviewed and are negative.   PHYSICAL EXAM: VS:  BP 130/62 (BP Location: Left Arm, Patient Position: Sitting, Cuff Size: Normal)   Pulse 78   Ht '5\' 3"'  (1.6 m)   Wt 152 lb (68.9 kg)   SpO2 97%   BMI 26.93 kg/m  , BMI Body mass index is 26.93 kg/m. Constitutional:  oriented to person, place, and time. No distress.  HENT:  Head: Grossly normal Eyes:  no discharge. No scleral icterus.  Neck: No JVD, no carotid bruits  Cardiovascular: Regular rate and rhythm, no murmurs appreciated Pulmonary/Chest: Clear to auscultation bilaterally, no wheezes or rails Abdominal: Soft.  no distension.  no tenderness.  Musculoskeletal: Normal range of motion Neurological:  normal muscle tone. Coordination normal. No atrophy Skin: Skin warm and dry Psychiatric: normal affect, pleasant  Recent Labs: 10/15/2021: TSH 1.33 12/22/2021: B Natriuretic Peptide 22.2 12/24/2021: Magnesium 2.2 02/14/2022: ALT 12; BUN 13; Creatinine, Ser 1.01; Hemoglobin 13.0; Platelets 177.0; Potassium 4.0; Sodium 137    Lipid Panel Lab Results  Component Value Date   CHOL 161 10/15/2021   HDL 43.30 10/15/2021   LDLCALC 94 10/15/2021   TRIG 118.0 10/15/2021      Wt Readings from Last 3 Encounters:  03/15/22 152 lb (68.9 kg)  02/25/22 151 lb 6 oz (68.7 kg)  02/14/22 152 lb 8 oz (69.2 kg)     ASSESSMENT AND PLAN:  Essential hypertension -  Blood pressure is well controlled on today's visit. No changes made to the medications.  LADA (latent autoimmune diabetes in adults), managed as type 1 ([place]) Working with  primary care A1C elevated after receiving prednisone   bilateral carotid artery stenosis Tolerating Crestor 20 mg daily,  Side effects on zetia, stopped  it  Adjustment disorder with mixed anxiety and depressed mood Recent loss of grandson through suicide Continues to work through everything  Chest pain Atypical features No further work-up needed Cardiac CTA completed, no significant disease  Pure hypercholesterolemia Reasonable numbers  on crestor  Chronic headaches Prior MRI Findings of chronic ischemic microangiopathy and generalized volume loss without a clear lobar predilection.    Total encounter time more than 40 minutes  Greater than 50% was spent in counseling and coordination of care with the patient   Orders Placed This Encounter  Procedures   EKG 12-Lead     Signed, Esmond Plants, M.D., Ph.D. 03/15/2022  [place], [place]

## 2022-03-15 ENCOUNTER — Ambulatory Visit (INDEPENDENT_AMBULATORY_CARE_PROVIDER_SITE_OTHER): Payer: HMO | Admitting: Cardiovascular Disease

## 2022-03-15 ENCOUNTER — Telehealth: Payer: Self-pay

## 2022-03-15 ENCOUNTER — Encounter: Payer: Self-pay | Admitting: Cardiovascular Disease

## 2022-03-15 VITALS — BP 130/62 | HR 78 | Ht 63.0 in | Wt 152.0 lb

## 2022-03-15 DIAGNOSIS — I872 Venous insufficiency (chronic) (peripheral): Secondary | ICD-10-CM

## 2022-03-15 DIAGNOSIS — I700 Atherosclerosis of aorta: Secondary | ICD-10-CM

## 2022-03-15 DIAGNOSIS — I10 Essential (primary) hypertension: Secondary | ICD-10-CM

## 2022-03-15 DIAGNOSIS — I739 Peripheral vascular disease, unspecified: Secondary | ICD-10-CM | POA: Diagnosis not present

## 2022-03-15 DIAGNOSIS — I5032 Chronic diastolic (congestive) heart failure: Secondary | ICD-10-CM | POA: Diagnosis not present

## 2022-03-15 DIAGNOSIS — G459 Transient cerebral ischemic attack, unspecified: Secondary | ICD-10-CM

## 2022-03-15 DIAGNOSIS — I6522 Occlusion and stenosis of left carotid artery: Secondary | ICD-10-CM | POA: Diagnosis not present

## 2022-03-15 DIAGNOSIS — R0600 Dyspnea, unspecified: Secondary | ICD-10-CM

## 2022-03-15 DIAGNOSIS — R079 Chest pain, unspecified: Secondary | ICD-10-CM

## 2022-03-15 DIAGNOSIS — E1169 Type 2 diabetes mellitus with other specified complication: Secondary | ICD-10-CM | POA: Diagnosis not present

## 2022-03-15 DIAGNOSIS — E139 Other specified diabetes mellitus without complications: Secondary | ICD-10-CM

## 2022-03-15 DIAGNOSIS — I6523 Occlusion and stenosis of bilateral carotid arteries: Secondary | ICD-10-CM

## 2022-03-15 DIAGNOSIS — E782 Mixed hyperlipidemia: Secondary | ICD-10-CM | POA: Diagnosis not present

## 2022-03-15 DIAGNOSIS — E785 Hyperlipidemia, unspecified: Secondary | ICD-10-CM

## 2022-03-15 DIAGNOSIS — Z794 Long term (current) use of insulin: Secondary | ICD-10-CM

## 2022-03-15 MED ORDER — CARVEDILOL 6.25 MG PO TABS
6.2500 mg | ORAL_TABLET | Freq: Two times a day (BID) | ORAL | 3 refills | Status: DC
Start: 2022-03-15 — End: 2022-04-11

## 2022-03-15 MED ORDER — LOSARTAN POTASSIUM 25 MG PO TABS: 25.0000 mg | ORAL_TABLET | Freq: Every day | ORAL | 3 refills | Status: DC

## 2022-03-15 NOTE — Patient Instructions (Signed)
Medication Instructions:  No changes  If you need a refill on your cardiac medications before your next appointment, please call your pharmacy.   Lab work: No new labs needed  Testing/Procedures: No new testing needed  Follow-Up: At [facility], you and your health needs are our priority.  As part of our continuing mission to provide you with exceptional heart care, we have created designated Provider Care Teams.  These Care Teams include your primary Cardiologist (physician) and Advanced Practice Providers (APPs -  Physician Assistants and Nurse Practitioners) who all work together to provide you with the care you need, when you need it.  You will need a follow up appointment in 12 months  Providers on your designated Care Team:   Christopher Berge, NP Ryan Dunn, PA-C Cadence Furth, PA-C  COVID-19 Vaccine Information can be found at: [email or web address] For questions related to vaccine distribution or appointments, please email [email or web address] or call [phone].   

## 2022-03-15 NOTE — Chronic Care Management (AMB) (Addendum)
  Chronic Care Management Pharmacy Assistant   Name: Cassandra Benson  MRN: [id number] DOB: 04/29/1941  Reason for Encounter: Medication Adherence and Delivery Coordination   PTM  Recent office visits:  03/07/22-Javier Gutierrez,MD(PCP)- phone message regarding insulin pumps 02/25/22-Javier Gutierrez,MD(PCP)- UTI-Symptoms suspicious for UTI - UA/micro largely ok but did show WBC present. Given symptoms, will start treatment with macrobid 1 wk course given allergies to PCN and sulfa.   Recent consult visits:  None since last CCM contact   Hospital visits:  12/22/21 thru 12/24/21- [facility] due to shortness of breath   Stopped benzonatate , prednisone & diphenhydramine  Medications: Outpatient Encounter Medications as of 03/15/2022  Medication Sig   acetaminophen (TYLENOL) 500 MG tablet Take 1 tablet (500 mg total) by mouth 3 (three) times daily as needed.   Ascorbic Acid (VITAMIN C) 100 MG tablet Take 100 mg by mouth daily.   BD ULTRA-FINE LANCETS lancets Use as instructed upto 6 times daily   carvedilol (COREG) 3.125 MG tablet Take 1 tablet (3.125 mg total) by mouth 2 (two) times daily with a meal.   Cholecalciferol (VITAMIN D3) 25 MCG (1000 UT) CAPS Take 1 capsule (1,000 Units total) by mouth daily.   clopidogrel (PLAVIX) 75 MG tablet TAKE ONE TABLET BY MOUTH EVERY EVENING   COMFORT EZ PEN NEEDLES 32G X 4 MM MISC Use 4-5x a day   Continuous Blood Gluc Transmit (DEXCOM G6 TRANSMITTER) MISC 1 kit by Does not apply route See admin instructions. Use to check blood sugar   diclofenac Sodium (VOLTAREN) 1 % GEL Apply 2 g topically 3 (three) times daily.   furosemide (LASIX) 20 MG tablet Take 1 tablet (20 mg total) by mouth daily.   Glucagon, rDNA, (GLUCAGON EMERGENCY) 1 MG KIT INJECT into THE muscle ONCE AS NEEDED FOR emergency   Insulin Aspart FlexPen (NOVOLOG) 100 UNIT/ML USE UP TO 50 UNITS DAILY in insulin pump (Patient taking differently: 10 - 12 units with each meal)   losartan  (COZAAR) 25 MG tablet TAKE ONE TABLET BY MOUTH ONCE DAILY   Multiple Vitamin (MULTIVITAMIN ADULT) TABS Take 1 tablet by mouth in the morning and at bedtime.   nitrofurantoin, macrocrystal-monohydrate, (MACROBID) 100 MG capsule Take 1 capsule (100 mg total) by mouth 2 (two) times daily.   ONETOUCH ULTRA test strip USE TO check blood glucose FOUR TIMES DAILY   rosuvastatin (CRESTOR) 20 MG tablet TAKE ONE TABLET BY MOUTH EVERY EVENING   SYNTHROID 75 MCG tablet TAKE ONE TABLET BY MOUTH Monday-Saturday BEFORE breakfast   topiramate (TOPAMAX) 50 MG tablet Take 1 tablet (50 mg total) by mouth 2 (two) times daily.   TRESIBA FLEXTOUCH 100 UNIT/ML FlexTouch Pen Inject 46 Units into the skin daily.   No facility-administered encounter medications on file as of 03/15/2022.   BP Readings from Last 3 Encounters:  02/25/22 (!) 160/72  02/14/22 140/66  02/08/22 120/72    Lab Results  Component Value Date   HGBA1C 11.0 (A) 02/08/2022      Recent OV, Consult or Hospital visit:  No medication changes indicated   Last adherence delivery date:02/24/22      Patient is due for next adherence delivery on: 03/25/22  Spoke with patient on 03/15/22 reviewed medications and coordinated delivery.  This delivery to include: Vials  30 Days  no safety caps VIAL medications: Synthroid 75 mcg - 1 tablet daily Monday-Saturday (before breakfast) [name] - 1 tablet every evening  Losartan 25 mg- take 1 tablet daily -    Carvedilol 3.125mg -take 1 tablet twice daily  Rosuvastatin 20mg- take 1 tablet evening  Patient declined the following medications this month: [name] - Use as directed  [name]- supply on hand  Topiramate 50mg- take 1 tablet 2 times daily- patient has 2 bottles remaining  Glucagon 1mg injection - use as needed- enough on hand  to see cardiology today to discuss refill  Tresiba Flextouch U-100 supply on hand  Trueplus pen needle 32g 4mm- patient has supply  on hand  Furosemide 20mg - take 1 tablet daily -patient to see cardiology today to discuss refill   Any concerns about your medications?  Patient states she continues to still have some surplus of medications, we review each month.   How often do you forget or accidentally miss a dose? Never  Do you use a pillbox? No  Is patient in packaging No   Refills requested from providers include: synthroid, clopidogrel  Confirmed delivery date of 03/25/22, advised patient that pharmacy will contact them the morning of delivery.  Recent blood pressure readings are as follows:  Encouraged the patient to find her BP system and take her BP from time to time , last office visit was elevated.   Recent blood glucose readings are as follows: Fasting:03/15/22  111    03/14/22  114    03/13/22  197  Annual wellness visit in last year? Yes Most Recent BP reading:160/72  95-P  02/25/22 office setting   If Diabetic: Most recent A1C reading:11.0  02/08/22 Last eye exam / retinopathy screening: due this year  Last diabetic foot exam: due this year   Lindsey Foltanski, CPP notified CCM 04/13/22    , [facility] concierge  [phone]  

## 2022-03-22 ENCOUNTER — Ambulatory Visit: Payer: HMO | Admitting: Diagnostic Neuroimaging

## 2022-03-28 ENCOUNTER — Other Ambulatory Visit: Payer: Self-pay | Admitting: *Deleted

## 2022-03-28 MED ORDER — FUROSEMIDE 20 MG PO TABS: 20.0000 mg | ORAL_TABLET | Freq: Every day | ORAL | 5 refills | Status: DC

## 2022-03-31 ENCOUNTER — Other Ambulatory Visit: Payer: Self-pay | Admitting: *Deleted

## 2022-03-31 DIAGNOSIS — I6522 Occlusion and stenosis of left carotid artery: Secondary | ICD-10-CM

## 2022-04-07 ENCOUNTER — Ambulatory Visit (INDEPENDENT_AMBULATORY_CARE_PROVIDER_SITE_OTHER): Payer: PPO | Admitting: Pulmonary Disease

## 2022-04-07 ENCOUNTER — Encounter: Payer: Self-pay | Admitting: Pulmonary Disease

## 2022-04-07 VITALS — BP 130/60 | HR 108 | Temp 97.9°F | Ht 63.0 in | Wt 148.2 lb

## 2022-04-07 DIAGNOSIS — R768 Other specified abnormal immunological findings in serum: Secondary | ICD-10-CM

## 2022-04-07 DIAGNOSIS — R499 Unspecified voice and resonance disorder: Secondary | ICD-10-CM

## 2022-04-07 DIAGNOSIS — J45909 Unspecified asthma, uncomplicated: Secondary | ICD-10-CM

## 2022-04-07 DIAGNOSIS — R7689 Other specified abnormal immunological findings in serum: Secondary | ICD-10-CM

## 2022-04-07 DIAGNOSIS — R0602 Shortness of breath: Secondary | ICD-10-CM | POA: Diagnosis not present

## 2022-04-07 DIAGNOSIS — R5383 Other fatigue: Secondary | ICD-10-CM

## 2022-04-07 MED ORDER — ALBUTEROL SULFATE HFA 108 (90 BASE) MCG/ACT IN AERS: 2.0000 | INHALATION_SPRAY | Freq: Four times a day (QID) | RESPIRATORY_TRACT | 2 refills | Status: DC | PRN

## 2022-04-07 MED ORDER — TRELEGY ELLIPTA 200-62.5-25 MCG/ACT IN AEPB: 1.0000 | INHALATION_SPRAY | Freq: Every day | RESPIRATORY_TRACT | 0 refills | Status: DC

## 2022-04-07 NOTE — Patient Instructions (Signed)
We are giving you a trial of an inhaler called Trelegy Ellipta.  This is 1 puff daily.  Make sure you rinse your mouth well after you use it.  Please let us know how you are doing with the inhaler this if it works, so we can call in a prescription for you.  We are also giving you an emergency inhaler called albuterol you can use 2 puffs up to 4 times a day as needed for shortness of breath.  We are scheduling formal breathing tests, some blood test to check on the possibility of lupus and a more detailed chest CT.  We will see him in follow-up in 4 to 6 weeks time call sooner should any new problems arise.

## 2022-04-07 NOTE — Progress Notes (Signed)
Subjective:    Patient ID: Cassandra Benson, female    DOB: 1941-01-22, 81 y.o.   MRN: [id number] Patient Care Team: [name], MD as PCP - General (Family Medicine) Minna Merritts, MD as PCP - Cardiology (Cardiology) Birder Robson, MD as Referring Physician (Ophthalmology) Charlton Haws, [facility] as Pharmacist (Pharmacist)  Chief Complaint  Patient presents with   pulmonary consult    SOB with exertion x25mo    HPI The patient is a 81year old lifelong never smoker with a very complex medical history as noted below, who presents for evaluation of dyspnea of approximately 5 to 6 months duration.  She is kindly referred by Dr. JRia Bush  She states that the issues were not triggered by any illness preceding the symptoms he just started noticing that without rhyme or reason her "breath would give out".  She also has noticed engine quality of her voice with her voice at times going from high pitch to aphonic.  She has no specific pattern to the symptoms.  She gives out easily due to fatigue and shortness of breath while doing activities of daily living or while walking.  She used to go on daily walks with friends but now avoids this due to her breathlessness and easy fatigability.  She has not had nocturnal awakenings with the shortness of breath.  No orthopnea or paroxysmal nocturnal dyspnea.  She does notice tachypalpitations.  No diaphoresis during these episodes.  She has not had any cough of note.  No hemoptysis.  Has occasional dysphagia mostly to solids but no gastroesophageal reflux symptoms.  No fevers, chills or sweats.  She has significant issues with arthralgias and myalgias, no joint swelling per se.  Nothing helps her symptoms.  Any activity aggravates symptoms.  Has taken nitrofurantoin however not for a prolonged period of time and currently is not on this medication.  History of positive ANA and Sjogren's.  She has a history of autoimmune diabetes.   History of Hashimoto's.  Has Raynaud's phenomena.  History of pertussis as a child but no history of asthma.  Questionable history of exposure to TB in childhood.  Review of Systems A 10 point review of systems was performed and it is as noted above otherwise negative.  Past Medical History:  Diagnosis Date   [facility]   Allergy    ANA positive    positive ANA pattern 1 speckled   Arthritis    Carotid stenosis, asymptomatic 06/19/2015   14-25%RICA 495-63%LICA rpt 1 yr (98/7564    CHF (congestive heart failure) ([place]    Colon polyps    COVID-19 virus infection 09/14/2021   [place]atomyos[place]    Diabetes mellitus without complication ([place]    Type 1   Diverticulosis    sigmoid on CT scan 12/2019   Family history of adverse reaction to anesthesia    brothr went into cardiac arrest from anectine   Fibromyalgia    prior PCP   GERD (gastroesophageal reflux disease)    prior PCP   Glaucoma    Narrow angle   History of blood clots    DVT, in 20s, none since   History of chicken pox    History of diverticulitis    History of pericarditis 1986   with hospitalization   History of pneumonia 2014   History of shingles    History of UTI    Hyperlipidemia    Hypertension  Hypothyroidism    Mixed connective tissue disease ([place])    Partial small bowel obstruction ([place]) 12/2019   managed conservatively   Peptic ulcer    Pneumonia    PONV (postoperative nausea and vomiting)    Raynaud's disease without gangrene    Shoulder pain left   h/o RTC tendonitis and adhesive capsulitis   Sjogren's syndrome ([place])    Sleep apnea    prior PCP - no CPAP for about 10 yrs   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[place])    Vitamin D deficiency    prior PCP   Past Surgical History:  Procedure Laterality Date   ABDOMINAL HYSTERECTOMY  1978   fibroids and menorrhagia, ovaries remain   ARTERY BIOPSY Right 04/06/2018   Procedure: BIOPSY TEMPORAL ARTERY RIGHT;   Surgeon: Beverly Gust, MD;  Location: [place];  Service: ENT;  Laterality: Right;  Diabetic - insulin pump sleep apnea   [facility] normal per patient   COLONOSCOPY  10/2011   1 TA, 1 HP, very tortuous colon ([place])   COLONOSCOPY  02/2020   TA, inflammatory polyp, mod diverticulosis, int/ext hemorrhoids ([place]) no rpt recommended    COLONOSCOPY WITH ESOPHAGOGASTRODUODENOSCOPY (EGD)  03/2007   2 ulcers, benign polyp, rpt 5 yrs [facility])   JOINT REPLACEMENT Right    hip   PARTIAL HIP ARTHROPLASTY  2013   Right hip replacement   TONSILLECTOMY     TONSILLECTOMY AND ADENOIDECTOMY     TRANSCAROTID ARTERY REVASCULARIZATION  Left 07/23/2018   Procedure: TRANSCAROTID ARTERY REVASCULARIZATION;  Surgeon: Marty Heck, MD;  Location: [place];  Service: Vascular;  Laterality: Left;   TUBAL LIGATION     VAGINAL DELIVERY     x2, no complications   Patient Active Problem List   Diagnosis Date Noted   Dysuria 02/25/2022   Body aches 02/15/2022   Insomnia 02/01/2022   Osteopenia 01/15/2022   Dyspnea on exertion 12/22/2021   Sigmoid diverticulitis 05/26/2021   Unsteadiness 02/13/2021   Atherosclerosis of aorta ([place]) 12/16/2020   Neck pain 12/03/2020   Cause of injury, fall 09/01/2020   Grade II diastolic dysfunction 31/49/7026   Chronic venous insufficiency 04/07/2020   Bowel habit changes 02/18/2020   Diverticulosis    Partial small bowel obstruction ([place]) 01/15/2020   Epistaxis 01/13/2020   Cognitive changes 10/22/2019   Right sided abdominal pain 10/07/2019   Skin lesion 09/14/2019   Renal insufficiency 09/14/2019   Pain in right buttock 06/18/2019   Chronic midline thoracic back pain 05/14/2019   Sjogren's syndrome without extraglandular involvement ([place]) 02/26/2019   Epigastric discomfort 12/17/2018   PAD (peripheral artery disease) ([place]) 10/04/2018   Raynaud disease 09/04/2018   Amaurosis fugax, both eyes  06/18/2018   TIA (transient ischemic attack) 05/21/2018   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   Facial paresthesia 02/07/2018   Pain and swelling of lower leg 12/25/2017   Fibromyalgia    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), bilateral 08/21/2017   Intractable episodic headache 03/13/2017   Numbness and tingling of both lower extremities 03/13/2017   6th nerve palsy, 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   Fatty liver 02/13/2017   LADA (latent autoimmune diabetes in adults), 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/2016   Medicare annual wellness visit, subsequent 06/19/2015   Health maintenance examination 06/19/2015   Advanced care planning/counseling discussion 06/19/2015   Carotid stenosis, 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 06/19/2015   Vitamin D deficiency    Family history of premature CAD 06/02/2015  Chest pain 05/26/2015   Elevated testosterone level in female 09/04/2014   Hyperlipidemia associated with type 2 diabetes mellitus ([place]) 07/30/2014   Essential hypertension 07/17/2014   Hypothyroidism due to Hashimoto's thyroiditis 07/17/2014   Apnea, sleep 08/22/2012   Chronic low back pain 02/27/2012   Positive ANA (antinuclear antibody) 01/06/2012   Family History  Problem Relation Age of Onset   CAD Mother 53       MI, aortic valve issues   COPD Mother    Lupus Mother    [name] Mother    Rheum arthritis Mother    CAD Father 84       CABG x2, aortic valve replacement   Stroke Sister    CAD Sister    55 Sister        brain   Lupus Sister    Diabetes Sister    Alcohol abuse Brother    CAD Brother 16       MI   Stroke Brother    Seizures Son    COPD Brother        agent orange   CAD Brother 18       stent   Diabetes Brother    Depression Grandchild    Breast cancer Maternal Aunt    Diabetes Sister    Breast cancer Sister    Breast cancer Maternal Aunt    Stroke Maternal Grandmother    Hypertension Maternal Grandmother    Gallbladder disease Maternal Grandmother     Colon cancer Neg Hx    Esophageal cancer Neg Hx    Rectal cancer Neg Hx    Stomach cancer Neg Hx    Social History   Tobacco Use   Smoking status: Never   Smokeless tobacco: Never  Substance Use Topics   Alcohol use: No   Allergies  Allergen Reactions   Iodinated Contrast Media Other (See Comments)    Itching (severe) and chest tightness   Penicillins Anaphylaxis, Swelling, Rash and Other (See Comments)    Has patient had a PCN reaction causing immediate rash, facial/tongue/throat swelling, SOB or lightheadedness with hypotension: Yes Has patient had a PCN reaction causing severe rash involving mucus membranes or skin necrosis: yes - remotely Has patient had a PCN reaction that required hospitalization: occurred while hospitalized Has patient had a PCN reaction occurring within the last 10 years: No If all of the above answers are "NO", then may proceed with Cephalosporin use.    Amlodipine Swelling    Pedal edema   Anectine [Succinylcholine] Other (See Comments)    Brother went into cardiac arrest.   Codeine Nausea Only   Gabapentin Other (See Comments)    Gait abnormality   Influenza Vaccines Other (See Comments)    Muscle weakness; unable to walk   Nortriptyline Other (See Comments)    Eye swelling and mouth drawed up   Pamelor [Nortriptyline Hcl] Other (See Comments)    Patient states caused her face to draw in together.   Valsartan Other (See Comments) and Cough    Allergy to generic only, "Hacking" cough   Zetia [Ezetimibe] Other (See Comments)    Bad muscle cramps   Erythromycin Rash and Swelling   Sulfa Antibiotics Rash   Current Meds  Medication Sig   acetaminophen (TYLENOL) 500 MG tablet Take 1 tablet (500 mg total) by mouth 3 (three) times daily as needed.   albuterol (VENTOLIN HFA) 108 (90 Base) MCG/ACT inhaler Inhale 2 puffs into the lungs every 6 (six)  hours as needed for wheezing or shortness of breath.   Ascorbic Acid (VITAMIN C) 100 MG tablet Take  100 mg by mouth daily.   BD ULTRA-FINE LANCETS lancets Use as instructed upto 6 times daily   carvedilol (COREG) 6.25 MG tablet Take 1 tablet (6.25 mg total) by mouth 2 (two) times daily with a meal.   Cholecalciferol (VITAMIN D3) 25 MCG (1000 UT) CAPS Take 1 capsule (1,000 Units total) by mouth daily.   clopidogrel (PLAVIX) 75 MG tablet TAKE ONE TABLET BY MOUTH EVERY EVENING   COMFORT EZ PEN NEEDLES 32G X 4 MM MISC Use 4-5x a day   Continuous Blood Gluc Transmit (DEXCOM G6 TRANSMITTER) MISC 1 kit by Does not apply route See admin instructions. Use to check blood sugar   diclofenac Sodium (VOLTAREN) 1 % GEL Apply 2 g topically 3 (three) times daily.   Fluticasone-Umeclidin-Vilant (TRELEGY ELLIPTA) 200-62.5-25 MCG/ACT AEPB Inhale 1 puff into the lungs daily.   furosemide (LASIX) 20 MG tablet Take 1 tablet (20 mg total) by mouth daily.   Glucagon, rDNA, (GLUCAGON EMERGENCY) 1 MG KIT INJECT into THE muscle ONCE AS NEEDED FOR emergency   Insulin Aspart FlexPen (NOVOLOG) 100 UNIT/ML USE UP TO 50 UNITS DAILY in insulin pump (Patient taking differently: 10 - 12 units with each meal)   losartan (COZAAR) 25 MG tablet Take 1 tablet (25 mg total) by mouth daily.   Multiple Vitamin (MULTIVITAMIN ADULT) TABS Take 1 tablet by mouth in the morning and at bedtime.   ONETOUCH ULTRA test strip USE TO check blood glucose FOUR TIMES DAILY   rosuvastatin (CRESTOR) 20 MG tablet TAKE ONE TABLET BY MOUTH EVERY EVENING   SYNTHROID 75 MCG tablet TAKE ONE TABLET BY MOUTH Monday-Saturday BEFORE breakfast   topiramate (TOPAMAX) 50 MG tablet Take 1 tablet (50 mg total) by mouth 2 (two) times daily.   TRESIBA FLEXTOUCH 100 UNIT/ML FlexTouch Pen Inject 46 Units into the skin daily.   Immunization History  Administered Date(s) Administered   Pneumococcal Conjugate-13 09/23/2014   Pneumococcal Polysaccharide-23 01/05/2010, 07/17/2013   Tdap 10/07/2011       Objective:   Physical Exam BP 130/60 (BP Location: Left Arm,  Cuff Size: Normal)   Pulse (!) 108   Temp 97.9 F (36.6 C) (Temporal)   Ht '5\' 3"'  (1.6 m)   Wt 148 lb 3.2 oz (67.2 kg)   SpO2 98%   BMI 26.25 kg/m  GENERAL: Well-developed, well-nourished woman, no acute distress.  Fully ambulatory mild conversational dyspnea.  Voice changes during interview.  Occasionally aphonic. HEAD: Normocephalic, atraumatic.  EYES: Pupils equal, round, reactive to light.  No scleral icterus.  MOUTH: Oral mucosa moist.  No thrush. NECK: Supple. No thyromegaly. Trachea midline. No JVD.  No adenopathy. PULMONARY: Good air entry bilaterally.  No adventitious sounds. CARDIOVASCULAR: S1 and S2. Regular rate and rhythm.  No rubs, murmurs or gallops heard. ABDOMEN: Protuberant, otherwise benign. MUSCULOSKELETAL: No joint deformity, no clubbing, no edema.  NEUROLOGIC: No overt focal deficit, no gait disturbance, speech is fluent. SKIN: Intact,warm,dry.  No overt rashes, exam limited. PSYCH: Anxious mood, normal behavior.  Ambulatory oximetry: Resting heart rate 96 bpm, resting O2 sat 97%.  Patient was only able to complete 2 laps due to the complaint of moderate dyspnea.  Heart rate max 102 bpm, oxygen saturations 98% throughout the test.  Office spirometry: Patient had poor test performance but on the 2 consistent trials noted to have mild to moderate airway obstruction.  FEV1 1.2 L  or 65% predicted, FVC 2.4 L or 94% predicted, FEV1/FVC 51%.  Recent Results (from the past 2160 hour(s))  POCT glycosylated hemoglobin (Hb A1C)     Status: Abnormal   Collection Time: 02/08/22 10:16 AM  Result Value Ref Range   Hemoglobin A1C 11.0 (A) 4.0 - 5.6 %   HbA1c POC (<> result, manual entry)     HbA1c, POC (prediabetic range)     HbA1c, POC (controlled diabetic range)    CBC with Differential/Platelet     Status: None   Collection Time: 02/14/22  3:18 PM  Result Value Ref Range   WBC 7.1 4.0 - 10.5 K/uL   RBC 4.79 3.87 - 5.11 Mil/uL   Hemoglobin 13.0 12.0 - 15.0 g/dL   HCT  39.4 36.0 - 46.0 %   MCV 82.2 78.0 - 100.0 fl   MCHC 33.0 30.0 - 36.0 g/dL   RDW 14.5 11.5 - 15.5 %   Platelets 177.0 150.0 - 400.0 K/uL   Neutrophils Relative % 66.6 43.0 - 77.0 %   Lymphocytes Relative 25.6 12.0 - 46.0 %   Monocytes Relative 6.0 3.0 - 12.0 %   Eosinophils Relative 0.8 0.0 - 5.0 %   Basophils Relative 1.0 0.0 - 3.0 %   Neutro Abs 4.7 1.4 - 7.7 K/uL   Lymphs Abs 1.8 0.7 - 4.0 K/uL   Monocytes Absolute 0.4 0.1 - 1.0 K/uL   Eosinophils Absolute 0.1 0.0 - 0.7 K/uL   Basophils Absolute 0.1 0.0 - 0.1 K/uL  Comprehensive metabolic panel     Status: Abnormal   Collection Time: 02/14/22  3:18 PM  Result Value Ref Range   Sodium 137 135 - 145 mEq/L   Potassium 4.0 3.5 - 5.1 mEq/L   Chloride 103 96 - 112 mEq/L   CO2 26 19 - 32 mEq/L   Glucose, Bld 482 (H) 70 - 99 mg/dL   BUN 13 6 - 23 mg/dL   Creatinine, Ser 1.01 0.40 - 1.20 mg/dL   Total Bilirubin 0.3 0.2 - 1.2 mg/dL   Alkaline Phosphatase 77 39 - 117 U/L   AST 14 0 - 37 U/L   ALT 12 0 - 35 U/L   Total Protein 6.4 6.0 - 8.3 g/dL   Albumin 3.9 3.5 - 5.2 g/dL   GFR 52.90 (L) >60.00 mL/min    Comment: Calculated using the CKD-EPI Creatinine Equation (2021)   Calcium 9.4 8.4 - 10.5 mg/dL  POCT Urinalysis Dipstick (Automated)     Status: Abnormal   Collection Time: 02/25/22  3:53 PM  Result Value Ref Range   Color, UA yellow    Clarity, UA clear    Glucose, UA Positive (A) Negative    Comment: 100 mg/dL   Bilirubin, UA negative    Ketones, UA negative    Spec Grav, UA 1.010 1.010 - 1.025   Blood, UA negative    pH, UA 6.0 5.0 - 8.0   Protein, UA Negative Negative   Urobilinogen, UA 0.2 0.2 or 1.0 E.U./dL   Nitrite, UA negative    Leukocytes, UA Small (1+) (A) Negative    Comment: 70 Leu/uL  Urine Culture     Status: None   Collection Time: 02/25/22  4:23 PM   Specimen: Urine  Result Value Ref Range   MICRO NUMBER: [id number]    SPECIMEN QUALITY: Adequate    Sample Source URINE    STATUS: FINAL    Result:       Mixed genital flora isolated. These  superficial bacteria are not indicative of a urinary tract infection. No further organism identification is warranted on this specimen. If clinically indicated, recollect clean-catch, mid-stream urine and transfer  immediately to Urine Culture Transport Tube.    CT angio chest performed 23 December 2021 reviewed: No evidence of PE.  Bibasilar atelectasis noted.  Increase in interstitial marking    Assessment & Plan:     ICD-10-CM   1. SOB (shortness of breath)  R06.02 Spirometry with Graph    Pulmonary Function Test [facility] Only    CT Chest High Resolution    Acetylcholine receptor, blocking Abs   Suspect multifactorial Formal PFTs    2. Positive ANA (antinuclear antibody)  R76.8 ANA+ENA+DNA/DS+Scl 70+SjoSSA/B    Rheumatoid Factor    C-reactive protein    Sedimentation rate   Check ANA panel Has history of Sjogren's as well [place] "shrinking lung syndrome" PFTs/high-res CT    3. 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 Hoarseness or changing voice  R49.9 Acetylcholine receptor, blocking Abs   Patient has hoarseness with change in quality of voice Fatigue particularly with any activity Query myasthenia Acetylcholine receptor assay    5. Fatigue, unspecified type  R53.83 Acetylcholine receptor, blocking Abs   As above, check acetylcholine receptor assay     Orders Placed This Encounter  Procedures   CT Chest High Resolution    Standing Status:   Future    Standing Expiration Date:   04/08/2023    Scheduling Instructions:     Next available.    Order Specific Question:   Preferred imaging location?    Answer:   [facility]   ANA+ENA+DNA/DS+Scl 70+SjoSSA/B    Standing Status:   Future    Standing Expiration Date:   10/08/2022   Rheumatoid Factor    Standing Status:    Future    Standing Expiration Date:   10/08/2022   C-reactive protein    Standing Status:   Future    Standing Expiration Date:   10/08/2022   Sedimentation rate    Standing Status:   Future    Standing Expiration Date:   10/08/2022   Acetylcholine receptor, blocking Abs    Standing Status:   Future    Standing Expiration Date:   10/08/2022   Pulmonary Function Test [facility] Only    Standing Status:   Future    Standing Expiration Date:   04/08/2023    Scheduling Instructions:     Next available.    Order Specific Question:   Full PFT: includes the following: basic spirometry, spirometry pre & post bronchodilator, diffusion capacity (DLCO), lung volumes    Answer:   Full PFT   Spirometry with Graph    Order Specific Question:   Where should this test be performed?    Answer:   [facility] ordered this encounter  Medications   Fluticasone-Umeclidin-Vilant (TRELEGY ELLIPTA) 200-62.5-25 MCG/ACT AEPB    Sig: Inhale 1 puff into the lungs daily.    Dispense:  14 each    Refill:  0    Order Specific Question:   Lot Number?    Answer:   [id number]    Order Specific Question:   Expiration Date?    Answer:   06/27/2023    Order Specific Question:   Manufacturer?    Answer:   [employer] [  12]    Order Specific Question:   Quantity    Answer:   1   albuterol (VENTOLIN HFA) 108 (90 Base) MCG/ACT inhaler    Sig: Inhale 2 puffs into the lungs every 6 (six) hours as needed for wheezing or shortness of breath.    Dispense:  8 g    Refill:  2   A very complex patient with which developed approximately 5 to 6 months ago.  She was actually evaluated for work-up in March with extensive work-up yielding no significant finding.  Recently evaluated by cardiology with no significant abnormality identified.  On her visit today she has some mild to moderate obstruction noted on spirometry however she did have significant difficulty with the test.  Ambulatory oximetry was nonrevealing.  She has an  extensive autoimmune history which needs to be reassessed.  With her issues of hoarseness, dysphagia, easy fatigability need to consider potential underlying myasthenia.  We will work-up these issues as above.  She has been given a trial of Trelegy Ellipta and albuterol to see if this helps with her symptoms.  We will see the patient in follow-up in 4 to 6 weeks time she is to contact us prior to that time should any new difficulties arise.  Total visit time 62 minutes.  Renold Don, MD Advanced Bronchoscopy PCCM [facility]    *This note was dictated using voice recognition software/Dragon.  Despite best efforts to proofread, errors can occur which can change the meaning. Any transcriptional errors that result from this process are unintentional and may not be fully corrected at the time of dictation.

## 2022-04-08 ENCOUNTER — Telehealth: Payer: Self-pay

## 2022-04-08 ENCOUNTER — Telehealth: Payer: Self-pay | Admitting: Family Medicine

## 2022-04-08 NOTE — Telephone Encounter (Signed)
Spoke with patient. She saw pulm Dr Patsey Berthold, note reviewed. Appreciate her thorough workup.

## 2022-04-08 NOTE — Telephone Encounter (Signed)
Patient is requesting a call back from Dr Darnell Level, she said she has an appt with pharmacist on 04/13/22. She said that its about her recent diagnosis regarding her breathing. She said she wasn't sure if it was in her mychart message. She said she has a lot of chronic issues. Call back [phone] home, cell [phone].

## 2022-04-08 NOTE — Chronic Care Management (AMB) (Unsigned)
Chronic Care Management Pharmacy Assistant   Name: Salvador Bigbee  MRN: [id number] DOB: 03-03-1941  Reason for Encounter: Reminder Call   Conditions to be addressed/monitored: HTN, HLD, and DMII   Recent office visits:  02/25/22-Javier Gutierrez,MD(PCP)-Dysuria,UA ordered,will start treatment with macrobid 1 wk course  02/14/22-Javier Gutierrez,MD(PCP)-general body aches,consult with Dr.Gherghe about tresiba and HA's,labs ordered,(A1c 11.0)Dexcom ordered,f/u 3 months 02/01/22-Javier Gutierrez,MD(PCP)-f/u-Latest LFTs normal.Recommend daily weights.Limit melatonin to 3-5 mg/night.f/u 3 months  01/04/22-Javier Gutierrez,MD(PCP)-hospital follow up-SOB,referral for pulmonology. 12/13/21-Javier Gutierrez,MD(PCP)- Leg pain and swelling,order D dimer(elevated)(blood counts and kidneys stable) and [place] leg. 11/17/21-Jessica Cody,MD(fam med)-f/u hospital visit,fall,xrays,possible referral for PT,f/u 2 weeks.for pain-- meloxicam 7.5 mg - can take daily for up to 2 weeks,Tylenol 1000 mg up to 3 times a day  Recent consult visits:  04/07/22-Carmen Gonzales,MD(pulmo) SOB,She has been given a trial of Trelegy Ellipta and albuterol to see if this helps with her symptoms.f/u 4-6 weeks 03/15/22-Timothy Gollan,MD(cardio)-f/u SOB,EKG,no medication changes,f/u 1 year 02/08/22-Cristina Gherghe,MD(endo)-f/u LADA,disucss [name] and risk of CHF,Change novolog 10 units with small meal,15 units with regular,18-20 units with larger meal.f/u 3 months 01/31/22-Tina Hackney,FNP(fam med)-CHF,no medication changes f/u 6 months 12/20/21-Tina Hackney,FNP(fam med)-[facility]-no medication changes, f/u 3 months 12/20/21-Cadence Furth,PA(cardio)-hospital f/u,EKG,no medication changes, future labs ordered f/u 2 weeks 11/09/21-Cristina Gherghe,MD(endo)- f/u LADA,refer for diabetes education about sensor,f/u 3 months 11/02/21-Timothy Gollan,MD(cardio)-f/u chest pain -kCTA ordered  Hospital visits:  Medication Reconciliation  was completed by comparing discharge summary, patient's EMR and Pharmacy list, and upon discussion with patient.  Admitted to the hospital on 12/22/21 due to SOB. Discharge date was 12/24/21. Discharged from [facility].    Medications Discontinued at Hospital Discharge: -Stopped Benzonatate  Prednisone-diphenhydramine  Medications that remain the same after Hospital Discharge:??  -All other medications will remain the same.    12/16/21-Dylan Smith,MD([facility])- leg swelling,SOB,labs ,EKG,chest xray,start low dose lasix,28m 1 tablet daily, f/u CHF clinic  11/12/21-[facility]-ankle pain- left AMA    Medications: Outpatient Encounter Medications as of 04/08/2022  Medication Sig   acetaminophen (TYLENOL) 500 MG tablet Take 1 tablet (500 mg total) by mouth 3 (three) times daily as needed.   albuterol (VENTOLIN HFA) 108 (90 Base) MCG/ACT inhaler Inhale 2 puffs into the lungs every 6 (six) hours as needed for wheezing or shortness of breath.   Ascorbic Acid (VITAMIN C) 100 MG tablet Take 100 mg by mouth daily.   BD ULTRA-FINE LANCETS lancets Use as instructed upto 6 times daily   carvedilol (COREG) 6.25 MG tablet Take 1 tablet (6.25 mg total) by mouth 2 (two) times daily with a meal.   Cholecalciferol (VITAMIN D3) 25 MCG (1000 UT) CAPS Take 1 capsule (1,000 Units total) by mouth daily.   clopidogrel (PLAVIX) 75 MG tablet TAKE ONE TABLET BY MOUTH EVERY EVENING   COMFORT EZ PEN NEEDLES 32G X 4 MM MISC Use 4-5x a day   Continuous Blood Gluc Transmit (DEXCOM G6 TRANSMITTER) MISC 1 kit by Does not apply route See admin instructions. Use to check blood sugar   diclofenac Sodium (VOLTAREN) 1 % GEL Apply 2 g topically 3 (three) times daily.   Fluticasone-Umeclidin-Vilant (TRELEGY ELLIPTA) 200-62.5-25 MCG/ACT AEPB Inhale 1 puff into the lungs daily.   furosemide (LASIX) 20 MG tablet Take 1 tablet (20 mg total) by mouth daily.   Glucagon, rDNA, (GLUCAGON EMERGENCY) 1 MG KIT INJECT into THE muscle ONCE  AS NEEDED FOR emergency   Insulin Aspart FlexPen (NOVOLOG) 100 UNIT/ML USE UP TO 50 UNITS DAILY in insulin pump (Patient  taking differently: 10 - 12 units with each meal)   losartan (COZAAR) 25 MG tablet Take 1 tablet (25 mg total) by mouth daily.   Multiple Vitamin (MULTIVITAMIN ADULT) TABS Take 1 tablet by mouth in the morning and at bedtime.   nitrofurantoin, macrocrystal-monohydrate, (MACROBID) 100 MG capsule Take 1 capsule (100 mg total) by mouth 2 (two) times daily.   ONETOUCH ULTRA test strip USE TO check blood glucose FOUR TIMES DAILY   rosuvastatin (CRESTOR) 20 MG tablet TAKE ONE TABLET BY MOUTH EVERY EVENING   SYNTHROID 75 MCG tablet TAKE ONE TABLET BY MOUTH Monday-Saturday BEFORE breakfast   topiramate (TOPAMAX) 50 MG tablet Take 1 tablet (50 mg total) by mouth 2 (two) times daily.   TRESIBA FLEXTOUCH 100 UNIT/ML FlexTouch Pen Inject 46 Units into the skin daily.   No facility-administered encounter medications on file as of 04/08/2022.   Shemica Meath Swatzell was contacted to remind of upcoming telephone visit with Charlene Brooke  on 04/13/22 at 11:00am. Patient was reminded to have any blood glucose and blood pressure readings available for review at appointment.   Patient confirmed appointment.   Are you having any problems with your medications? No   Do you have any concerns you like to discuss with the pharmacist? Yes   CCM referral has been placed prior to visit?  No    Star Rating Drugs: Medication:  Last Fill: [facility]  Novolog  08/23/21 Losartan 60m 11/18/21 30 Rosuvastatin 253m2/23/23 30 Tresiba   11/18/21  LiCharlene BrookeCPP notified  VeAvel SensorCCMadison [phone]

## 2022-04-10 ENCOUNTER — Other Ambulatory Visit: Payer: Self-pay | Admitting: Cardiovascular Disease

## 2022-04-11 ENCOUNTER — Other Ambulatory Visit: Payer: Self-pay

## 2022-04-11 ENCOUNTER — Ambulatory Visit
Admission: RE | Admit: 2022-04-11 | Discharge: 2022-04-11 | Disposition: A | Discharge status: Home / Self Care | Payer: HMO | Source: Ambulatory Visit | Attending: Pulmonary Disease | Admitting: Pulmonary Disease

## 2022-04-11 DIAGNOSIS — J84112 Idiopathic pulmonary fibrosis: Secondary | ICD-10-CM | POA: Diagnosis not present

## 2022-04-11 DIAGNOSIS — R0602 Shortness of breath: Secondary | ICD-10-CM | POA: Diagnosis not present

## 2022-04-11 DIAGNOSIS — R918 Other nonspecific abnormal finding of lung field: Secondary | ICD-10-CM | POA: Diagnosis not present

## 2022-04-11 DIAGNOSIS — I700 Atherosclerosis of aorta: Secondary | ICD-10-CM | POA: Diagnosis not present

## 2022-04-11 DIAGNOSIS — J479 Bronchiectasis, uncomplicated: Secondary | ICD-10-CM | POA: Diagnosis not present

## 2022-04-11 MED ORDER — CARVEDILOL 6.25 MG PO TABS: 6.2500 mg | ORAL_TABLET | Freq: Two times a day (BID) | ORAL | 2 refills | Status: DC

## 2022-04-13 ENCOUNTER — Telehealth: Payer: Self-pay

## 2022-04-13 ENCOUNTER — Ambulatory Visit: Payer: HMO | Admitting: Pharmacist

## 2022-04-13 DIAGNOSIS — I5189 Other ill-defined heart diseases: Secondary | ICD-10-CM

## 2022-04-13 DIAGNOSIS — E1169 Type 2 diabetes mellitus with other specified complication: Secondary | ICD-10-CM

## 2022-04-13 DIAGNOSIS — E139 Other specified diabetes mellitus without complications: Secondary | ICD-10-CM

## 2022-04-13 DIAGNOSIS — I10 Essential (primary) hypertension: Secondary | ICD-10-CM

## 2022-04-13 DIAGNOSIS — I700 Atherosclerosis of aorta: Secondary | ICD-10-CM

## 2022-04-13 NOTE — Progress Notes (Signed)
Chronic Care Management Pharmacy Note  04/13/2022 Name:  Cassandra Benson MRN:  [id number] DOB:  [birth year]  Summary: CCM F/U visit -Pt has a lot of extra medication, she has been filling at both [facility] and [facility] -DM very uncontrolled (A1c 11.0 01/2022); Pt has decided to change endocrinologists, provided pt with clinics that are accepting patients and she will let us know which one to refer to -She is not using Dexcom and not checking sugar as prescribed; HTA will cover either Dexcom or Libre  Recommendations/Changes made from today's visit: -Scheduled Home visit - organize medications and start CGM -Coordinate new endocrine referral per pt preference - [facility])  Plan: -Pharmacist follow up Home Visit scheduled for 04/25/22 -PCP F/U 05/04/22    Subjective: Cassandra Benson is an 81 y.o. year old female who is a primary patient of Cassandra Bush, MD.  The CCM team was consulted for assistance with disease management and care coordination needs.    Engaged with patient by telephone for follow up visit in response to provider referral for pharmacy case management and/or care coordination services.   Consent to Services:  The patient was given information about Chronic Care Management services, agreed to services, and gave verbal consent prior to initiation of services.  Please see initial visit note for detailed documentation.   Patient Care Team: Cassandra Bush, MD as PCP - General (Family Medicine) Cassandra Benson Cassandra November, MD as PCP - Cardiology (Cardiology) Birder Robson, MD as Referring Physician (Ophthalmology) Charlton Haws, [facility] as Pharmacist (Pharmacist)  Recent office visits: 02/25/22-Javier Gutierrez,MD(PCP)-Dysuria,UA ordered,will start treatment with macrobid 1 wk course  02/14/22-Javier Gutierrez,MD(PCP)-general body aches,consult with Dr.Gherghe about tresiba and HA's,labs ordered,(A1c 11.0); Dexcom ordered,f/u 3  months 02/01/22-Javier Gutierrez,MD(PCP)-f/u-Latest LFTs normal.Recommend daily weights.Limit melatonin to 3-5 mg/night.f/u 3 months  01/04/22-Javier Gutierrez,MD(PCP)-hospital follow up-SOB,referral for pulmonology. 12/13/21-Javier Gutierrez,MD(PCP)- Leg pain and swelling,order D dimer(elevated)(blood counts and kidneys stable) and [place] leg. 11/17/21-Cassandra Cody,MD(fam med)-f/u hospital visit,fall,xrays,possible referral for PT,f/u 2 weeks.for pain-- meloxicam 7.5 mg - can take daily for up to 2 weeks,Tylenol 1000 mg up to 3 times a day  Recent consult visits: 04/07/22-Cassandra Gonzales,MD  (Pulmonary): consult - SOB. Ordered PFT, CT chest. Trial of Trelegy, Albuterol HFA to see if they help.  03/15/22-Cassandra Gollan,MD(cardio)-f/u SOB,EKG, increase carvedilol to 6.25 mg BID 02/08/22-Cassandra Gherghe,MD(endo)-f/u LADA,disucss [name] and risk of CHF,Change novolog 10 units with small meal,15 units with regular,18-20 units with larger meal.f/u 3 months 01/31/22-Cassandra Hackney,FNP(fam med)-CHF,no medication changes f/u 6 months 12/20/21-Cassandra Hackney,FNP(fam med)-[facility]-no medication changes, f/u 3 months 12/20/21-Cassandra Furth,PA(cardio)-hospital f/u,EKG,no medication changes, future labs ordered f/u 2 weeks 11/09/21-Cassandra Gherghe,MD(endo)- f/u LADA,refer for diabetes education about sensor,f/u 3 months 11/02/21-Cassandra Gollan,MD(cardio)-f/u chest pain -kCTA ordered  Hospital visits: Admitted to the hospital on 12/22/21 due to SOB. Discharge date was 12/24/21. Discharged from [facility]   -SOB - no etiology found. Discharged with PCP f/u   Medications that remain the same after Hospital Discharge:??  -All other medications will remain the same.     12/16/21 ED visit [facility]):  leg swelling,SOB,labs ,EKG,chest xray,start low dose lasix,13m 1 tablet daily, f/u CHF clinic   11/12/21-[place] [facility]-ankle pain- left AMA    Objective:  Lab Results  Component Value Date   CREATININE 1.01 02/14/2022    BUN 13 02/14/2022   GFR 52.90 (L) 02/14/2022   EGFR 52 (L) 12/27/2021   GFRNONAA >60 12/24/2021   GFRAA >60 03/05/2020   NA 137 02/14/2022   K 4.0  02/14/2022   CALCIUM 9.4 02/14/2022   CO2 26 02/14/2022   GLUCOSE 482 (H) 02/14/2022    Lab Results  Component Value Date/Time   HGBA1C 11.0 (A) 02/08/2022 10:16 AM   HGBA1C 10.3 (A) 11/09/2021 09:57 AM   HGBA1C 8.4 (H) 01/15/2020 05:01 AM   HGBA1C 9.7 (H) 09/10/2019 07:49 AM   GFR 52.90 (L) 02/14/2022 03:18 PM   GFR 54.25 (L) 12/13/2021 04:46 PM   MICROALBUR 0.1 10/23/2014 02:31 PM   MICROALBUR 0.8 07/17/2014 09:49 AM    Last diabetic Eye exam:  Lab Results  Component Value Date/Time   HMDIABEYEEXA No Retinopathy 11/04/2020 12:00 AM    Last diabetic Foot exam:  Lab Results  Component Value Date/Time   HMDIABFOOTEX normal 07/30/2014 12:00 AM     Lab Results  Component Value Date   CHOL 161 10/15/2021   HDL 43.30 10/15/2021   LDLCALC 94 10/15/2021   LDLDIRECT 113.0 09/09/2020   TRIG 118.0 10/15/2021   CHOLHDL 4 10/15/2021       Latest Ref Rng & Units 02/14/2022    3:18 PM 12/16/2021    4:03 PM 10/15/2021    8:50 AM  Hepatic Function  Total Protein 6.0 - 8.3 g/dL 6.4  7.1  6.1   Albumin 3.5 - 5.2 g/dL 3.9  3.8  3.8   AST 0 - 37 U/L _0 ALT 0 - 35 U/L _1 Alk Phosphatase 39 - 117 U/L 77  77  80   Total Bilirubin 0.2 - 1.2 mg/dL 0.3  0.6  0.3     Lab Results  Component Value Date/Time   TSH 1.33 10/15/2021 08:50 AM   TSH 2.16 09/09/2020 07:40 AM   FREET4 0.71 09/09/2020 07:40 AM   FREET4 1.05 11/27/2019 09:23 AM       Latest Ref Rng & Units 02/14/2022    3:18 PM 12/24/2021    5:06 AM 12/23/2021    5:19 AM  CBC  WBC 4.0 - 10.5 K/uL 7.1  13.8  8.4   Hemoglobin 12.0 - 15.0 g/dL 13.0  12.9  12.8   Hematocrit 36.0 - 46.0 % 39.4  39.1  40.2   Platelets 150.0 - 400.0 K/uL 177.0  210  202     Lab Results  Component Value Date/Time   VD25OH 26.70 (L) 10/15/2021 08:50 AM   VD25OH 35.41  09/09/2020 07:40 AM    Clinical ASCVD: Yes  The 10-year ASCVD risk score (Arnett DK, et al., 2019) is: 51.5%   Values used to calculate the score:     Age: 89 years     Sex: Female     Is Non-Hispanic African American: No     Diabetic: Yes     Tobacco smoker: No     Systolic Blood Pressure: 889 mmHg     Is BP treated: Yes     HDL Cholesterol: 43.3 mg/dL     Total Cholesterol: 161 mg/dL       09/23/2021   10:48 AM 10/07/2020    8:55 AM 09/09/2020   10:50 AM  Depression screen PHQ 2/9  Decreased Interest 0 0 0  Down, Depressed, Hopeless 0 0 0  PHQ - 2 Score 0 0 0  Altered sleeping  0 0  Tired, decreased energy  0 0  Change in appetite  0 0  Feeling bad or failure about yourself   0 0  Trouble concentrating  0 0  Moving slowly or fidgety/restless  0 0  Suicidal thoughts  0 0  PHQ-9 Score  0 0  Difficult doing work/chores  Not difficult at all Not difficult at all     Social History   Tobacco Use  Smoking Status Never  Smokeless Tobacco Never   BP Readings from Last 3 Encounters:  04/07/22 130/60  03/15/22 130/62  02/25/22 (!) 160/72   Pulse Readings from Last 3 Encounters:  04/07/22 (!) 108  03/15/22 78  02/25/22 95   Wt Readings from Last 3 Encounters:  04/07/22 148 lb 3.2 oz (67.2 kg)  03/15/22 152 lb (68.9 kg)  02/25/22 151 lb 6 oz (68.7 kg)   BMI Readings from Last 3 Encounters:  04/07/22 26.25 kg/m  03/15/22 26.93 kg/m  02/25/22 26.81 kg/m    Assessment/Interventions: Review of patient past medical history, allergies, medications, health status, including review of consultants reports, laboratory and other test data, was performed as part of comprehensive evaluation and provision of chronic care management services.   SDOH:  (Social Determinants of Health) assessments and interventions performed: No - done Dec 2022  SDOH Screenings   Alcohol Screen: Low Risk  (09/23/2021)   Alcohol Screen    Last Alcohol Screening Score (AUDIT): 0   Depression (PHQ2-9): Low Risk  (09/23/2021)   Depression (PHQ2-9)    PHQ-2 Score: 0  Financial Resource Strain: Low Risk  (09/23/2021)   Overall Financial Resource Strain (CARDIA)    Difficulty of Paying Living Expenses: Not hard at all  Food Insecurity: No Food Insecurity (09/23/2021)   Hunger Vital Sign    Worried About Running Out of Food in the Last Year: Never true    Ran Out of Food in the Last Year: Never true  Housing: Low Risk  (09/23/2021)   Housing    Last Housing Risk Score: 0  Physical Activity: Insufficiently Active (09/23/2021)   Exercise Vital Sign    Days of Exercise per Week: 3 days    Minutes of Exercise per Session: 30 min  Social Connections: Moderately Integrated (09/23/2021)   Social Connection and Isolation Panel [NHANES]    Frequency of Communication with Friends and Family: More than three times a week    Frequency of Social Gatherings with Friends and Family: More than three times a week    Attends Religious Services: More than 4 times per year    Active Member of [employer] or Organizations: Yes    Attends Archivist Meetings: More than 4 times per year    Marital Status: Widowed  Stress: No Stress Concern Present (09/23/2021)   [place]    Feeling of Stress : Not at all  Tobacco Use: Low Risk  (04/07/2022)   Patient History    Smoking Tobacco Use: Never    Smokeless Tobacco Use: Never    Passive Exposure: Not on file  Transportation Needs: No Transportation Needs (09/23/2021)   PRAPARE - Transportation    Lack of Transportation (Medical): No    Lack of Transportation (Non-Medical): No    CCM Care Plan  Allergies  Allergen Reactions   Iodinated Contrast Media Other (See Comments)    Itching (severe) and chest tightness   Penicillins Anaphylaxis, Swelling, Rash and Other (See Comments)    Has patient had a PCN reaction causing immediate rash, facial/tongue/throat  swelling, SOB or lightheadedness with hypotension: Yes Has patient had a PCN reaction causing severe rash involving mucus membranes or skin necrosis: yes -  remotely Has patient had a PCN reaction that required hospitalization: occurred while hospitalized Has patient had a PCN reaction occurring within the last 10 years: No If all of the above answers are "NO", then may proceed with Cephalosporin use.    Amlodipine Swelling    Pedal edema   Anectine [Succinylcholine] Other (See Comments)    Brother went into cardiac arrest.   Codeine Nausea Only   Gabapentin Other (See Comments)    Gait abnormality   Influenza Vaccines Other (See Comments)    Muscle weakness; unable to walk   Nortriptyline Other (See Comments)    Eye swelling and mouth drawed up   Pamelor [Nortriptyline Hcl] Other (See Comments)    Patient states caused her face to draw in together.   Valsartan Other (See Comments) and Cough    Allergy to generic only, "Hacking" cough   Zetia [Ezetimibe] Other (See Comments)    Bad muscle cramps   Erythromycin Rash and Swelling   Sulfa Antibiotics Rash    Medications Reviewed Today     Reviewed by Tyler Pita, MD (Physician) on 04/07/22 at [phone]  Med List Status: <None>   Medication Order Taking? Sig Documenting Provider Last Dose Status Informant  acetaminophen (TYLENOL) 500 MG tablet [id number] Yes Take 1 tablet (500 mg total) by mouth 3 (three) times daily as needed. Cassandra Bush, MD Taking Active Self, Multiple Informants, Pharmacy Records  albuterol (VENTOLIN HFA) 108 (90 Base) MCG/ACT inhaler [id number] Yes Inhale 2 puffs into the lungs every 6 (six) hours as needed for wheezing or shortness of breath. Tyler Pita, MD  Active   Ascorbic Acid (VITAMIN C) 100 MG tablet [id number] Yes Take 100 mg by mouth daily. [provider] Taking Active Self, Multiple Informants, Pharmacy Records  BD ULTRA-FINE LANCETS lancets [id number] Yes Use as instructed upto 6  times daily Phadke, Karsten Ro, MD Taking Active Self, Multiple Informants, Pharmacy Records  carvedilol (COREG) 6.25 MG tablet [id number] Yes Take 1 tablet (6.25 mg total) by mouth 2 (two) times daily with a meal. Benson, Cassandra November, MD Taking Active   Cholecalciferol (VITAMIN D3) 25 MCG (1000 UT) CAPS [id number] Yes Take 1 capsule (1,000 Units total) by mouth daily. Cassandra Bush, MD Taking Active   clopidogrel (PLAVIX) 75 MG tablet [id number] Yes TAKE ONE TABLET BY MOUTH EVERY EVENING Angelia Mould, MD Taking Active   COMFORT EZ PEN NEEDLES 32G X 4 MM [place] [id number] Yes Use 4-5x a day Philemon Kingdom, MD Taking Active Multiple Informants, Pharmacy Records, Self  Continuous Blood Gluc Transmit (DEXCOM G6 TRANSMITTER) [place] [id number] Yes 1 kit by Does not apply route See admin instructions. Use to check blood sugar Philemon Kingdom, MD Taking Active Self, Multiple Informants, Pharmacy Records  diclofenac Sodium (VOLTAREN) 1 % GEL [id number] Yes Apply 2 g topically 3 (three) times daily. Cassandra Bush, MD Taking Active Multiple Informants, Pharmacy Records, Self  Fluticasone-Umeclidin-[facility] ELLIPTA) 200-62.5-25 MCG/ACT AEPB [id number] Yes Inhale 1 puff into the lungs daily. Tyler Pita, MD  Active   furosemide (LASIX) 20 MG tablet [id number] Yes Take 1 tablet (20 mg total) by mouth daily. Minna Merritts, MD Taking Active   Glucagon, rDNA, (GLUCAGON EMERGENCY) 1 MG KIT [id number] Yes INJECT into THE muscle ONCE AS NEEDED FOR emergency Philemon Kingdom, MD Taking Active Multiple Informants, Pharmacy Records, Self  Insulin Aspart FlexPen (NOVOLOG) 100 UNIT/ML [id number] Yes USE UP TO 50 UNITS DAILY in insulin pump  Patient taking differently: 10 - 12 units with  each meal   Philemon Kingdom, MD Taking Active Multiple Informants, Pharmacy Records, Self  losartan (COZAAR) 25 MG tablet [id number] Yes Take 1 tablet (25 mg total) by mouth daily. Minna Merritts, MD Taking Active    Multiple Vitamin (MULTIVITAMIN ADULT) TABS [id number] Yes Take 1 tablet by mouth in the morning and at bedtime. Cassandra Bush, MD Taking Active   nitrofurantoin, macrocrystal-monohydrate, (MACROBID) 100 MG capsule [id number]  Take 1 capsule (100 mg total) by mouth 2 (two) times daily. Cassandra Bush, MD  Active   [facility] ULTRA test strip [id number] Yes USE TO check blood glucose FOUR TIMES DAILY Philemon Kingdom, MD Taking Active Multiple Informants, Pharmacy Records, Self  rosuvastatin (CRESTOR) 20 MG tablet [id number] Yes TAKE ONE TABLET BY MOUTH EVERY Recardo Evangelist, MD Taking Active   SYNTHROID 75 MCG tablet [id number] Yes TAKE ONE TABLET BY MOUTH Monday-Saturday BEFORE breakfast Cassandra Bush, MD Taking Active   topiramate (TOPAMAX) 50 MG tablet [id number] Yes Take 1 tablet (50 mg total) by mouth 2 (two) times daily. Penumalli, Earlean Polka, MD Taking Active Multiple Informants, Pharmacy Records, Self  TRESIBA FLEXTOUCH 100 UNIT/ML FlexTouch Pen [id number] Yes Inject 46 Units into the skin daily. Philemon Kingdom, MD Taking Active Multiple Informants, Pharmacy Records, Self            Patient Active Problem List   Diagnosis Date Noted   Dysuria 02/25/2022   Body aches 02/15/2022   Insomnia 02/01/2022   Osteopenia 01/15/2022   Dyspnea on exertion 12/22/2021   Sigmoid diverticulitis 05/26/2021   Unsteadiness 02/13/2021   Atherosclerosis of aorta ([place]) 12/16/2020   Neck pain 12/03/2020   Cause of injury, fall 09/01/2020   Grade II diastolic dysfunction 83/66/2947   Chronic venous insufficiency 04/07/2020   Bowel habit changes 02/18/2020   Diverticulosis    Partial small bowel obstruction ([place]) 01/15/2020   Epistaxis 01/13/2020   Cognitive changes 10/22/2019   Right sided abdominal pain 10/07/2019   Skin lesion 09/14/2019   Renal insufficiency 09/14/2019   Pain in right buttock 06/18/2019   Chronic midline thoracic back pain 05/14/2019   Sjogren's syndrome  without extraglandular involvement ([place]) 02/26/2019   Epigastric discomfort 12/17/2018   PAD (peripheral artery disease) ([place]) 10/04/2018   Raynaud disease 09/04/2018   Amaurosis fugax, both eyes 06/18/2018   TIA (transient ischemic attack) 05/21/2018   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   Facial paresthesia 02/07/2018   Pain and swelling of lower leg 12/25/2017   Fibromyalgia    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), bilateral 08/21/2017   Intractable episodic headache 03/13/2017   Numbness and tingling of both lower extremities 03/13/2017   6th nerve palsy, 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   Fatty liver 02/13/2017   LADA (latent autoimmune diabetes in adults), managed as type 1 ([place]) 10/03/2016   Medicare annual wellness visit, subsequent 06/19/2015   Health maintenance examination 06/19/2015   Advanced care planning/counseling discussion 06/19/2015   Carotid stenosis, 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 06/19/2015   Vitamin D deficiency    Family history of premature CAD 06/02/2015   Chest pain 05/26/2015   Elevated testosterone level in female 09/04/2014   Hyperlipidemia associated with type 2 diabetes mellitus ([place]) 07/30/2014   Essential hypertension 07/17/2014   Hypothyroidism due to Hashimoto's thyroiditis 07/17/2014   Apnea, sleep 08/22/2012   Chronic low back pain 02/27/2012   Positive ANA (antinuclear antibody) 01/06/2012    Immunization History  Administered Date(s) Administered   Pneumococcal Conjugate-13 09/23/2014   Pneumococcal Polysaccharide-23 01/05/2010, 07/17/2013   Tdap 10/07/2011  Conditions to be addressed/monitored:  Hypertension, Hyperlipidemia, Diabetes, Heart Failure, and Coronary Artery Disease  Care Plan : [place]  Updates made by Charlton Haws, RPH since 04/13/2022 12:00 AM     Problem: Hypertension, Hyperlipidemia, Diabetes, Heart Failure, and Coronary Artery Disease      Long-Range Goal: Disease mgmt   Start Date: 01/19/2021   Expected End Date: 04/14/2023  This Visit's Progress: On track  Priority: High  Note:   Current Barriers:  Unable to achieve control of Diabetes  Does not always maintain contact with provider office  Pharmacist Clinical Goal(s):  Patient will achieve improvement in DM as evidenced by CGM readings contact provider office for questions/concerns as evidenced notation of same in electronic health record through collaboration with PharmD and provider.   Interventions: 1:1 collaboration with Cassandra Bush, MD regarding development and update of comprehensive plan of care as evidenced by provider attestation and co-signature Inter-disciplinary care team collaboration (see longitudinal plan of care) Comprehensive medication review performed; medication list updated in electronic medical record  Hypertension / Heart Failure (BP goal <140/90) -Controlled - per clinic readings within goal  -Last ejection fraction: 60-65% (Date: 11/2021) -HF type: Diastolic; NYHA Class: III (marked limitation of activity) -Current treatment: Carvedilol 6.25 mg BID -Appropriate, Effective, Safe, Accessible Losartan 25 mg daily -Appropriate, Effective, Safe, Accessible Furosemide 20 mg daily -Appropriate, Effective, Safe, Accessible -Medications previously tried: none  -Current home readings: none reported, usually well controlled at home -Current dietary habits: patient reports heart healthy diet -Denies hypotensive/hypertensive symptoms -Educated on BP goals and benefits of medications for prevention of heart attack, stroke and kidney damage; -Counseled to monitor BP at home weekly, document, and provide log at future appointments -Recommended to continue current medication  Hyperlipidemia: (LDL goal < 70) -Not ideally controlled - LDL 94 (09/2021) above goal; pt did not tolerate ezetimibe -Hx TIA, PAD, atherosclerosis -Current treatment: Rosuvastatin 20 mg daily - Appropriate, Query  Effective [name] daily - Appropriate, Effective, Safe, Accessible -Medications previously tried: ezetimibe  -Educated on Cholesterol goals;  -Recommended to continue current medication; consider higher dose of rosuvastatin  Diabetes (A1c goal <8%) -Uncontrolled - A1c 11.0% (01/2022) -Follows with Dr Cruzita Lederer for LADA although recently has decided to see a new endocrinologist; came off insulin pump due to inability to get supplies -Current home glucose readings: pt does not have readings available -Denies hypoglycemic/hyperglycemic symptoms -Current medications: Tresiba 56 units daily -Appropriate, Query Effective Novolog 07-11-19 units (based on meal size) -Appropriate, Query Effective Glucagon PRN Dexcom G6 - not using One Touch supplies -Medications previously tried: insulin pump  -Educated on A1c and blood sugar goals; Continuous glucose monitoring; -Reviewed endocrine referral - many locations are not accepting patients, sent patient list of clinics that are available and she will let us know which one she would prefer -Recommend to restart Dexcom or Freestyle Libre for CGM - will bring samples to home visit and let pt decide on one. HTA will cover either -Recommended to continue current medication; coordinate referral to new endocrinologist per patient preference; start CGM  SOB (Goal: diagnosis) -Not ideally controlled - pt is audibly short of breath on phone and gets progressively worse the longer she talks; she reports some improvement with inhalers -Established w/ pulm 04/07/22. CT chest - mild pulmonary fibrosis. Spirometry - mild airway obstruction -Current treatment  Trelegy 200-62.5-25 mcg/act 1 puff daily - Appropriate, Query Effective Albuterol HFA prn - Appropriate, Effective, Safe, Accessible -Medications previously tried: n/a  -Advised to  take Trelegy daily and use albuterol PRN for breakthrough symptoms -Advised to follow up with pulmonology as  scheduled  Patient Goals/Self-Care Activities Patient will:  - take medications as prescribed as evidenced by patient report and record review focus on medication adherence by routine check glucose fasting and with meals, document, and provide at future appointments       Medication Assistance: None required.  Patient affirms current coverage meets needs.  Compliance/Adherence/Medication fill history: Care Gaps: Eye exam (due 11/04/21)  Star-Rating Drugs: Rosuvastatin - PDC inaccurate  Medication Access: Within the past 30 days, how often has patient missed a dose of medication? 0 Is a pillbox or other method used to improve adherence? Yes  Factors that may affect medication adherence?  Insurance issues Are meds synced by current pharmacy? Yes  Are meds delivered by current pharmacy? Yes  Does patient experience delays in picking up medications due to transportation concerns? No   [facility] Reviewed: Is patient disadvantaged to use [facility]?: No  Current Rx insurance plan: HTA Name and location of Current pharmacy:  [facility] - [place], [place] - [address] [place] [address] Phone: [phone] Fax: [phone]  [facility] services reviewed with patient today?: Yes  Delivery 04/25/22: 30-day VIAL medications: Synthroid 75 mcg - 1 tablet daily Monday-Saturday (before breakfast) [name] - 1 tablet every evening  Losartan 25 mg- take 1 tablet daily -  Carvedilol 3.162m -take 1 tablet twice daily  Rosuvastatin 282m take 1 tablet evening Furosemide 2098make 1 tablet daily  Tresiba Flextouch U-100  Trueplus pen needle 32g [name] - Use as directed  Novolog Flexpen 100 unit/ml   Patient declined the following medications this month: Topiramate 49m82make 1 tablet 2 times daily- patient has 2 bottles remaining  Glucagon 1mg 44mection - use as needed-  enough on hand    Care Plan and Follow Up Patient Decision:  Patient agrees to Care Plan and Follow-up.  Plan: Face to Face appointment with care management team member scheduled for: 04/25/22  LindsCharlene BrookermD, BCACP Clinical Pharmacist [phone]

## 2022-04-13 NOTE — Patient Instructions (Signed)
Visit Information  Phone number for Pharmacist: [phone]   Goals Addressed   None     Care Plan : [place]  Updates made by Charlton Haws, RPH since 04/13/2022 12:00 AM     Problem: Hypertension, Hyperlipidemia, Diabetes, Heart Failure, and Coronary Artery Disease      Long-Range Goal: Disease mgmt   Start Date: 01/19/2021  Expected End Date: 04/14/2023  This Visit's Progress: On track  Priority: High  Note:   Current Barriers:  Unable to achieve control of Diabetes  Does not always maintain contact with provider office  Pharmacist Clinical Goal(s):  Patient will achieve improvement in DM as evidenced by CGM readings contact provider office for questions/concerns as evidenced notation of same in electronic health record through collaboration with PharmD and provider.   Interventions: 1:1 collaboration with [name], MD regarding development and update of comprehensive plan of care as evidenced by provider attestation and co-signature Inter-disciplinary care team collaboration (see longitudinal plan of care) Comprehensive medication review performed; medication list updated in electronic medical record  Hypertension / Heart Failure (BP goal <140/90) -Controlled - per clinic readings within goal  -Last ejection fraction: 60-65% (Date: 11/2021) -HF type: Diastolic; NYHA Class: III (marked limitation of activity) -Current treatment: Carvedilol 6.25 mg BID -Appropriate, Effective, Safe, Accessible Losartan 25 mg daily -Appropriate, Effective, Safe, Accessible Furosemide 20 mg daily -Appropriate, Effective, Safe, Accessible -Medications previously tried: none  -Current home readings: none reported, usually well controlled at home -Current dietary habits: patient reports heart healthy diet -Denies hypotensive/hypertensive symptoms -Educated on BP goals and benefits of medications for prevention of heart attack, stroke and kidney  damage; -Counseled to monitor BP at home weekly, document, and provide log at future appointments -Recommended to continue current medication  Hyperlipidemia: (LDL goal < 70) -Not ideally controlled - LDL 94 (09/2021) above goal; pt did not tolerate ezetimibe -Hx TIA, PAD, atherosclerosis -Current treatment: Rosuvastatin 20 mg daily - Appropriate, Query Effective [name] daily - Appropriate, Effective, Safe, Accessible -Medications previously tried: ezetimibe  -Educated on Cholesterol goals;  -Recommended to continue current medication; consider higher dose of rosuvastatin  Diabetes (A1c goal <8%) -Uncontrolled - A1c 11.0% (01/2022) -Follows with Dr Cruzita Lederer for LADA although recently has decided to see a new endocrinologist; came off insulin pump due to inability to get supplies -Current home glucose readings: pt does not have readings available -Denies hypoglycemic/hyperglycemic symptoms -Current medications: Tresiba 56 units daily -Appropriate, Query Effective Novolog 07-11-19 units (based on meal size) -Appropriate, Query Effective Glucagon PRN Dexcom G6 - not using One Touch supplies -Medications previously tried: insulin pump  -Educated on A1c and blood sugar goals; Continuous glucose monitoring; -Reviewed endocrine referral - many locations are not accepting patients, sent patient list of clinics that are available and she will let us know which one she would prefer -Recommend to restart Dexcom or Freestyle Libre for CGM - will bring samples to home visit and let pt decide on one. HTA will cover either -Recommended to continue current medication; coordinate referral to new endocrinologist per patient preference; start CGM  SOB (Goal: diagnosis) -Not ideally controlled - pt is audibly short of breath on phone and gets progressively worse the longer she talks; she reports some improvement with inhalers -Established w/ pulm 04/07/22. CT chest - mild pulmonary fibrosis.  Spirometry - mild airway obstruction -Current treatment  Trelegy 200-62.5-25 mcg/act 1 puff daily - Appropriate, Query Effective Albuterol HFA prn - Appropriate, Effective, Safe, Accessible -Medications previously tried:  n/a  -Advised to take Trelegy daily and use albuterol PRN for breakthrough symptoms -Advised to follow up with pulmonology as scheduled  Patient Goals/Self-Care Activities Patient will:  - take medications as prescribed as evidenced by patient report and record review focus on medication adherence by routine check glucose fasting and with meals, document, and provide at future appointments       Patient verbalizes understanding of instructions and care plan provided today and agrees to view in [place]. Active MyChart status and patient understanding of how to access instructions and care plan via MyChart confirmed with patient.    Face to Face appointment with pharmacist scheduled for:  04/25/22  Charlene Brooke, PharmD, BCACP Clinical Pharmacist [place] [facility] at [facility] [phone]

## 2022-04-13 NOTE — Chronic Care Management (AMB) (Addendum)
Chronic Care Management Pharmacy Assistant   Name: Kloe Oates  MRN: [id number] DOB: 1941/05/26  Reason for Encounter: Medication Adherence and Delivery Coordination     Recent office visits:  02/25/22-Javier Gutierrez,MD(PCP)- Dysuria, UA,Culture,( now on low dose carvedilol) . Given symptoms, will start treatment with macrobid 1 wk course given   Recent consult visits:  04/07/22-Carmen Gonzales,MD(pulmo)-consult for SOB,reviewed labs, PFT's,She has been given a trial of Trelegy Ellipta and albuterol to see if this helps with her symptoms,f/u 4-6 weeks 03/15/22-Timothy Gollan,MD(cardio)-f/u heart failure,labs reviewed, EKG,No medication changes,f/u 12 months  Hospital visits:  Medication Reconciliation was completed by comparing discharge summary, patient's EMR and Pharmacy list, and upon discussion with patient.  Amitted to the hospital on 12/22/21 due to SOB. Discharge date was 12/24/21. Discharged from [facility].     Medications Discontinued at Hospital Discharge: -Stopped Benzonatate Prednisone   Medications that remain the same after Hospital Discharge:??  -All other medications will remain the same.    [facility] leg swelling,SOB,labs,EKG,xrays,  We discussed starting a low-dose of Lasix 4m take 1 tablet daily  and follow-up closely with the CHF clinic. No admission   Medications: Outpatient Encounter Medications as of 04/13/2022  Medication Sig   acetaminophen (TYLENOL) 500 MG tablet Take 1 tablet (500 mg total) by mouth 3 (three) times daily as needed.   albuterol (VENTOLIN HFA) 108 (90 Base) MCG/ACT inhaler Inhale 2 puffs into the lungs every 6 (six) hours as needed for wheezing or shortness of breath.   Ascorbic Acid (VITAMIN C) 100 MG tablet Take 100 mg by mouth daily.   BD ULTRA-FINE LANCETS lancets Use as instructed upto 6 times daily   carvedilol (COREG) 6.25 MG tablet Take 1 tablet (6.25 mg total) by mouth 2 (two) times daily  with a meal.   Cholecalciferol (VITAMIN D3) 25 MCG (1000 UT) CAPS Take 1 capsule (1,000 Units total) by mouth daily.   clopidogrel (PLAVIX) 75 MG tablet TAKE ONE TABLET BY MOUTH EVERY EVENING   COMFORT EZ PEN NEEDLES 32G X 4 MM MISC Use 4-5x a day   Continuous Blood Gluc Transmit (DEXCOM G6 TRANSMITTER) MISC 1 kit by Does not apply route See admin instructions. Use to check blood sugar   diclofenac Sodium (VOLTAREN) 1 % GEL Apply 2 g topically 3 (three) times daily.   Fluticasone-Umeclidin-Vilant (TRELEGY ELLIPTA) 200-62.5-25 MCG/ACT AEPB Inhale 1 puff into the lungs daily.   furosemide (LASIX) 20 MG tablet Take 1 tablet (20 mg total) by mouth daily.   Glucagon, rDNA, (GLUCAGON EMERGENCY) 1 MG KIT INJECT into THE muscle ONCE AS NEEDED FOR emergency   Insulin Aspart FlexPen (NOVOLOG) 100 UNIT/ML USE UP TO 50 UNITS DAILY in insulin pump (Patient taking differently: 10 - 12 units with each meal)   losartan (COZAAR) 25 MG tablet Take 1 tablet (25 mg total) by mouth daily.   Multiple Vitamin (MULTIVITAMIN ADULT) TABS Take 1 tablet by mouth in the morning and at bedtime.   nitrofurantoin, macrocrystal-monohydrate, (MACROBID) 100 MG capsule Take 1 capsule (100 mg total) by mouth 2 (two) times daily.   ONETOUCH ULTRA test strip USE TO check blood glucose FOUR TIMES DAILY   rosuvastatin (CRESTOR) 20 MG tablet TAKE ONE TABLET BY MOUTH EVERY EVENING   SYNTHROID 75 MCG tablet TAKE ONE TABLET BY MOUTH Monday-Saturday BEFORE breakfast   topiramate (TOPAMAX) 50 MG tablet Take 1 tablet (50 mg total) by mouth 2 (two) times daily.   TRESIBA FLEXTOUCH 100 UNIT/ML FlexTouch  Pen Inject 46 Units into the skin daily.   No facility-administered encounter medications on file as of 04/13/2022.   BP Readings from Last 3 Encounters:  04/07/22 130/60  03/15/22 130/62  02/25/22 (!) 160/72    Lab Results  Component Value Date   HGBA1C 11.0 (A) 02/08/2022      Recent OV, Consult or Hospital visit:   713/23  Pulmonology-trial Trelegy and Albuterol Recent medication changes indicated:  12/16/21  [facility] ED- start lasix 5m take 1 tablet daily   Last adherence delivery date:03/25/22      Patient is due for next adherence delivery on: 04/25/22  Spoke with patient on 04/13/22 reviewed medications and coordinated delivery.  This delivery to include: Vials  30 Days  No Safety Caps  VIAL medications: Tresiba Flextouch U-100   Patient declined the following medications this month: Topiramate 589m take 1 tablet 2 times daily- patient has 2 bottles remaining  Glucagon 20m33mnjection - use as needed- enough on hand  Synthroid 75 mcg - 1 tablet daily Monday-Saturday (before breakfast) [name] - 1 tablet every evening  Losartan 25 mg- take 1 tablet daily -  Carvedilol 3.125m75make 1 tablet twice daily  Rosuvastatin 20mg28mke 1 tablet evening Furosemide 20mg 36m 1 tablet daily  Trueplus pen needle 32g [name] - Use as directed  [name]  No refill request needed.  Acute form uploaded for Tresiba 100 unit/ml- inject 46 units daily, sent to [facility] for delivery tomorrow 04/14/22  Confirmed delivery date of 04/14/22, advised patient that pharmacy will contact them the morning of delivery.  Recent blood pressure readings are as follows:none available    Recent blood glucose readings are as follows:none available     Annual wellness visit in last year? Yes Most Recent BP reading:  160/72  95-P  02/25/22 office setting  If Diabetic: Most recent A1C reading:11.0  02/08/22 Last eye exam / retinopathy screening: due this year  Last diabetic foot exam: due this year  [facility]  LindseCharlene Brookenotified  VelmenAvel Sensor [phone]

## 2022-04-20 ENCOUNTER — Telehealth: Payer: Self-pay

## 2022-04-20 NOTE — Chronic Care Management (AMB) (Signed)
    Chronic Care Management Pharmacy Assistant   Name: Cassandra Benson  MRN: [id number] DOB: 1941/06/29  Reason for Encounter: Reminder Call   Medications: Outpatient Encounter Medications as of 04/20/2022  Medication Sig   acetaminophen (TYLENOL) 500 MG tablet Take 1 tablet (500 mg total) by mouth 3 (three) times daily as needed.   albuterol (VENTOLIN HFA) 108 (90 Base) MCG/ACT inhaler Inhale 2 puffs into the lungs every 6 (six) hours as needed for wheezing or shortness of breath.   Ascorbic Acid (VITAMIN C) 100 MG tablet Take 100 mg by mouth daily.   BD ULTRA-FINE LANCETS lancets Use as instructed upto 6 times daily   carvedilol (COREG) 6.25 MG tablet Take 1 tablet (6.25 mg total) by mouth 2 (two) times daily with a meal.   Cholecalciferol (VITAMIN D3) 25 MCG (1000 UT) CAPS Take 1 capsule (1,000 Units total) by mouth daily.   clopidogrel (PLAVIX) 75 MG tablet TAKE ONE TABLET BY MOUTH EVERY EVENING   COMFORT EZ PEN NEEDLES 32G X 4 MM MISC Use 4-5x a day   Continuous Blood Gluc Transmit (DEXCOM G6 TRANSMITTER) MISC 1 kit by Does not apply route See admin instructions. Use to check blood sugar (Patient not taking: Reported on 04/13/2022)   diclofenac Sodium (VOLTAREN) 1 % GEL Apply 2 g topically 3 (three) times daily.   Fluticasone-Umeclidin-Vilant (TRELEGY ELLIPTA) 200-62.5-25 MCG/ACT AEPB Inhale 1 puff into the lungs daily.   furosemide (LASIX) 20 MG tablet Take 1 tablet (20 mg total) by mouth daily.   Glucagon, rDNA, (GLUCAGON EMERGENCY) 1 MG KIT INJECT into THE muscle ONCE AS NEEDED FOR emergency   Insulin Aspart FlexPen (NOVOLOG) 100 UNIT/ML USE UP TO 50 UNITS DAILY in insulin pump (Patient taking differently: 10 - 12 units with each meal)   losartan (COZAAR) 25 MG tablet Take 1 tablet (25 mg total) by mouth daily.   Multiple Vitamin (MULTIVITAMIN ADULT) TABS Take 1 tablet by mouth in the morning and at bedtime.   nitrofurantoin, macrocrystal-monohydrate, (MACROBID) 100 MG capsule  Take 1 capsule (100 mg total) by mouth 2 (two) times daily.   ONETOUCH ULTRA test strip USE TO check blood glucose FOUR TIMES DAILY   rosuvastatin (CRESTOR) 20 MG tablet TAKE ONE TABLET BY MOUTH EVERY EVENING   SYNTHROID 75 MCG tablet TAKE ONE TABLET BY MOUTH Monday-Saturday BEFORE breakfast   topiramate (TOPAMAX) 50 MG tablet Take 1 tablet (50 mg total) by mouth 2 (two) times daily.   TRESIBA FLEXTOUCH 100 UNIT/ML FlexTouch Pen Inject 46 Units into the skin daily. (Patient taking differently: Inject 56 Units into the skin daily.)   No facility-administered encounter medications on file as of 04/20/2022.   Zakyla Tonche Kingdon was contacted to remind of upcoming-home visit with Charlene Brooke  on 04/25/22 at 3:45pm. Patient was reminded to have any blood glucose and blood pressure readings available for review at appointment.   Patient confirmed appointment.   Are you having any problems with your medications? No   Do you have any concerns you like to discuss with the pharmacist? No   CCM referral has been placed prior to visit?  No    Star Rating Drugs: Medication:  Last Fill: Day Supply Novolog Losartan $RemoveBeforeDE'25mg'[id number]$  11/18/21 30 Rosuvastatin $RemoveBeforeD'20mg'[id number]$  11/18/21 30 Tresiba   11/18/21 33   Working on adherence of all medications with [place], [facility]  CPP notified  [name], [place] Assistant [phone]

## 2022-04-22 ENCOUNTER — Ambulatory Visit: Payer: HMO | Attending: Pulmonary Disease

## 2022-04-22 DIAGNOSIS — R0602 Shortness of breath: Secondary | ICD-10-CM | POA: Insufficient documentation

## 2022-04-22 MED ORDER — ALBUTEROL SULFATE (2.5 MG/3ML) 0.083% IN NEBU
2.5000 mg | INHALATION_SOLUTION | Freq: Once | RESPIRATORY_TRACT | Status: AC
  Administered 2022-04-22: 2.5 mg via RESPIRATORY_TRACT

## 2022-04-25 ENCOUNTER — Ambulatory Visit (INDEPENDENT_AMBULATORY_CARE_PROVIDER_SITE_OTHER): Payer: PPO | Admitting: Pharmacist

## 2022-04-25 ENCOUNTER — Telehealth: Payer: Self-pay

## 2022-04-25 DIAGNOSIS — E139 Other specified diabetes mellitus without complications: Secondary | ICD-10-CM

## 2022-04-25 MED ORDER — FREESTYLE LIBRE 3 SENSOR MISC: 3 refills | Status: DC

## 2022-04-25 NOTE — Progress Notes (Signed)
    Chronic Care Management Pharmacy Assistant   Name: Danene Montijo  MRN: [id number] DOB: [birth year]  Reason for Encounter: CCM (Reschedule home visit)   Called patient to reschedule home visit with Charlene Brooke. No answer; left voicemail.  Charlene Brooke, CPP notified  [name], [place] Clinical Pharmacy Assistant [phone]

## 2022-04-25 NOTE — Patient Instructions (Addendum)
Visit Information  [facility] [address], [address] [place], [place] [address] Dr Alanson Aly, Dr Delrae Rend  [facility] - [place] [phone], [place] [address] Dr Kathlene Cote, NP Otho Bellows   Phone number for Pharmacist: [phone]   Goals Addressed   None     Care Plan : [place]  Updates made by Charlton Haws, [facility] since 04/29/2022 12:00 AM     Problem: Hypertension, Hyperlipidemia, Diabetes, Heart Failure, and Coronary Artery Disease      Long-Range Goal: Disease mgmt   Start Date: 01/19/2021  Expected End Date: 04/14/2023  This Visit's Progress: On track  Recent Progress: On track  Priority: High  Note:   Current Barriers:  Unable to achieve control of Diabetes  Does not always maintain contact with provider office  Pharmacist Clinical Goal(s):  Patient will achieve improvement in DM as evidenced by CGM readings contact provider office for questions/concerns as evidenced notation of same in electronic health record through collaboration with PharmD and provider.   Interventions: 1:1 collaboration with [name], MD regarding development and update of comprehensive plan of care as evidenced by provider attestation and co-signature Inter-disciplinary care team collaboration (see longitudinal plan of care) Comprehensive medication review performed; medication list updated in electronic medical record  Hypertension / Heart Failure (BP goal <140/90) -Controlled - per clinic readings within goal  -Last ejection fraction: 60-65% (Date: 11/2021) -HF type: Diastolic; NYHA Class: III (marked limitation of activity) -Current treatment: Carvedilol 6.25 mg BID -Appropriate, Effective, Safe, Accessible Losartan 25 mg daily -Appropriate, Effective, Safe, Accessible Furosemide 20 mg daily -Appropriate, Effective, Safe, Accessible -Medications previously tried: none   -Current home readings: none reported, usually well controlled at home -Current dietary habits: patient reports heart healthy diet -Denies hypotensive/hypertensive symptoms -Educated on BP goals and benefits of medications for prevention of heart attack, stroke and kidney damage; -Counseled to monitor BP at home weekly, document, and provide log at future appointments -Recommended to continue current medication  Hyperlipidemia: (LDL goal < 70) -Not ideally controlled - LDL 94 (09/2021) above goal; pt did not tolerate ezetimibe -Hx TIA, PAD, atherosclerosis -Current treatment: Rosuvastatin 20 mg daily - Appropriate, Query Effective [name] daily - Appropriate, Effective, Safe, Accessible -Medications previously tried: ezetimibe  -Educated on Cholesterol goals;  -Recommended to continue current medication; consider higher dose of rosuvastatin  Diabetes (A1c goal <8%) -Uncontrolled - A1c 11.0% (01/2022) -Follows with Dr Cruzita Lederer for LADA although recently has decided to see a new endocrinologist; came off insulin pump due to inability to get supplies -Current home glucose readings: pt does not have readings available -Denies hypoglycemic/hyperglycemic symptoms -Current medications: Tresiba 56 units daily -Appropriate, Query Effective Novolog 07-11-19 units (based on meal size) -Appropriate, Query Effective Glucagon PRN One Touch supplies -Medications previously tried: insulin pump  -Educated on A1c and blood sugar goals; Continuous glucose monitoring; -Reviewed endocrine referral - many locations are not accepting patients, sent patient list of clinics that are available and she will let us know which one she would prefer -Completed training for [employer] today (see summary) -Recommended to continue current medication; coordinate referral to new endocrinologist per patient preference  SOB (Goal: diagnosis) -Not ideally controlled - pt is audibly short of breath on  phone and gets progressively worse the longer she talks; she reports some improvement with inhalers -Established w/ pulm 04/07/22. CT chest - mild pulmonary fibrosis. Spirometry - mild airway obstruction -  Current treatment  Trelegy 200-62.5-25 mcg/act 1 puff daily - Appropriate, Query Effective Albuterol HFA prn - Appropriate, Effective, Safe, Accessible -Medications previously tried: n/a  -Advised to take Trelegy daily and use albuterol PRN for breakthrough symptoms -Advised to follow up with pulmonology as scheduled  Patient Goals/Self-Care Activities Patient will:  - take medications as prescribed as evidenced by patient report and record review focus on medication adherence by routine check glucose fasting and with meals, document, and provide at future appointments       Patient verbalizes understanding of instructions and care plan provided today and agrees to view in [place]. Active MyChart status and patient understanding of how to access instructions and care plan via MyChart confirmed with patient.    Telephone follow up appointment with pharmacy team member scheduled for: 1 month  Charlene Brooke, PharmD, [facility] Clinical Pharmacist [place] [facility] [phone]

## 2022-04-25 NOTE — Progress Notes (Signed)
Chronic Care Management Pharmacy Note  04/29/2022 Name:  Cassandra Benson MRN:  [id number] DOB:  [birth year]  Summary: CCM F/U visit -Pt has a lot of extra medication, she has been filling at both [facility] and [facility]. Organized new med sync plan to simplify regimen. -DM very uncontrolled (A1c 11.0 01/2022); Pt has decided to change endocrinologists, provided pt with clinics that are accepting patients and she will let Cassandra Benson know which one to refer to  Patient presented for [facility] 3 training. Demonstrated with teach-back: -components of sensor and how to prepare sensor for application -how to apply sensor to arm. Patient successfully applied sensor to L arm arm today -how to navigate [employer] app in order to pair sensor; sensor was successfully paired today -how to adjust high/low sugar alarms. Low alarm set to 90 and high alarm set to 270 today. -App users: demonstrated navigating various components including settings, logbook, and daily patterns  Educated patient on: -scanning sensor at least every 8 hours to ensure no gaps in data -changing sensor every 14 days or as initiated by app/reader -avoiding high doses of Vitamin C > 500 mg/day -removing sensor prior to MRI, CT scans -if sugar readings ever seem wrong/questionable, check sugar with a finger stick -[employer] cloud monitoring: Patient did enroll in [place]  Provided customer service line for [employer] in case of sensor breakdowns: [phone]   Pt voiced understanding of above and denies further questions.  Recommendations/Changes made from today's visit: -Coordinate new endocrine referral per pt preference - [facility]  Plan: -Pharmacist follow up Home Visit scheduled for 04/25/22 -PCP F/U 05/04/22    Subjective: Cassandra Benson is an 81 y.o. year old female who is a primary patient of [name], MD.  The CCM team was consulted for assistance with  disease management and care coordination needs.    Engaged with patient by telephone for follow up visit in response to provider referral for pharmacy case management and/or care coordination services.   Consent to Services:  The patient was given information about Chronic Care Management services, agreed to services, and gave verbal consent prior to initiation of services.  Please see initial visit note for detailed documentation.   Patient Care Team: [name], MD as PCP - General (Family Medicine) Rockey Situ Kathlene November, MD as PCP - Cardiology (Cardiology) Birder Robson, MD as Referring Physician (Ophthalmology) Charlton Haws, [facility] as Pharmacist (Pharmacist)  Recent office visits: 02/25/22-Javier Gutierrez,MD(PCP)-Dysuria,UA ordered,will start treatment with macrobid 1 wk course  02/14/22-Javier Gutierrez,MD(PCP)-general body aches,consult with Dr.Gherghe about tresiba and HA's,labs ordered,(A1c 11.0); Dexcom ordered,f/u 3 months 02/01/22-Javier Gutierrez,MD(PCP)-f/u-Latest LFTs normal.Recommend daily weights.Limit melatonin to 3-5 mg/night.f/u 3 months  01/04/22-Javier Gutierrez,MD(PCP)-hospital follow up-SOB,referral for pulmonology. 12/13/21-Javier Gutierrez,MD(PCP)- Leg pain and swelling,order D dimer(elevated)(blood counts and kidneys stable) and Cassandra Benson leg. 11/17/21-Jessica Cody,MD(fam med)-f/u hospital visit,fall,xrays,possible referral for PT,f/u 2 weeks.for pain-- meloxicam 7.5 mg - can take daily for up to 2 weeks,Tylenol 1000 mg up to 3 times a day  Recent consult visits: 04/07/22-Carmen Gonzales,MD  (Pulmonary): consult - SOB. Ordered PFT, CT chest. Trial of Trelegy, Albuterol HFA to see if they help.  03/15/22-Timothy Gollan,MD(cardio)-f/u SOB,EKG, increase carvedilol to 6.25 mg BID 02/08/22-Cristina Gherghe,MD(endo)-f/u LADA,disucss [name] and risk of CHF,Change novolog 10 units with small meal,15 units with regular,18-20 units with larger meal.f/u 3 months 01/31/22-Tina  Hackney,FNP(fam med)-CHF,no medication changes f/u 6 months 12/20/21-Tina Hackney,FNP(fam med)-[facility]-no medication changes, f/u 3 months 12/20/21-Cadence Furth,PA(cardio)-hospital f/u,EKG,no medication changes, future labs ordered  f/u 2 weeks 11/09/21-Cristina Gherghe,MD(endo)- f/u LADA,refer for diabetes education about sensor,f/u 3 months 11/02/21-Timothy Gollan,MD(cardio)-f/u chest pain -kCTA ordered  Hospital visits: Admitted to the hospital on 12/22/21 due to SOB. Discharge date was 12/24/21. Discharged from [facility].   -SOB - no etiology found. Discharged with PCP f/u   Medications that remain the same after Hospital Discharge:??  -All other medications will remain the same.     12/16/21 ED visit [facility]):  leg swelling,SOB,labs ,EKG,chest xray,start low dose lasix,56m 1 tablet daily, f/u CHF clinic   11/12/21-[place] ED-ankle pain- left AMA    Objective:  Lab Results  Component Value Date   CREATININE 1.01 02/14/2022   BUN 13 02/14/2022   GFR 52.90 (L) 02/14/2022   EGFR 52 (L) 12/27/2021   GFRNONAA >60 12/24/2021   GFRAA >60 03/05/2020   NA 137 02/14/2022   K 4.0 02/14/2022   CALCIUM 9.4 02/14/2022   CO2 26 02/14/2022   GLUCOSE 482 (H) 02/14/2022    Lab Results  Component Value Date/Time   HGBA1C 11.0 (A) 02/08/2022 10:16 AM   HGBA1C 10.3 (A) 11/09/2021 09:57 AM   HGBA1C 8.4 (H) 01/15/2020 05:01 AM   HGBA1C 9.7 (H) 09/10/2019 07:49 AM   GFR 52.90 (L) 02/14/2022 03:18 PM   GFR 54.25 (L) 12/13/2021 04:46 PM   MICROALBUR 0.1 10/23/2014 02:31 PM   MICROALBUR 0.8 07/17/2014 09:49 AM    Last diabetic Eye exam:  Lab Results  Component Value Date/Time   HMDIABEYEEXA No Retinopathy 11/04/2020 12:00 AM    Last diabetic Foot exam:  Lab Results  Component Value Date/Time   HMDIABFOOTEX normal 07/30/2014 12:00 AM     Lab Results  Component Value Date   CHOL 161 10/15/2021   HDL 43.30 10/15/2021   LDLCALC 94 10/15/2021   LDLDIRECT 113.0 09/09/2020    TRIG 118.0 10/15/2021   CHOLHDL 4 10/15/2021       Latest Ref Rng & Units 02/14/2022    3:18 PM 12/16/2021    4:03 PM 10/15/2021    8:50 AM  Hepatic Function  Total Protein 6.0 - 8.3 g/dL 6.4  7.1  6.1   Albumin 3.5 - 5.2 g/dL 3.9  3.8  3.8   AST 0 - 37 U/L '14  19  14   ' ALT 0 - 35 U/L '12  19  11   ' Alk Phosphatase 39 - 117 U/L 77  77  80   Total Bilirubin 0.2 - 1.2 mg/dL 0.3  0.6  0.3     Lab Results  Component Value Date/Time   TSH 1.33 10/15/2021 08:50 AM   TSH 2.16 09/09/2020 07:40 AM   FREET4 0.71 09/09/2020 07:40 AM   FREET4 1.05 11/27/2019 09:23 AM       Latest Ref Rng & Units 02/14/2022    3:18 PM 12/24/2021    5:06 AM 12/23/2021    5:19 AM  CBC  WBC 4.0 - 10.5 K/uL 7.1  13.8  8.4   Hemoglobin 12.0 - 15.0 g/dL 13.0  12.9  12.8   Hematocrit 36.0 - 46.0 % 39.4  39.1  40.2   Platelets 150.0 - 400.0 K/uL 177.0  210  202     Lab Results  Component Value Date/Time   VD25OH 26.70 (L) 10/15/2021 08:50 AM   VD25OH 35.41 09/09/2020 07:40 AM    Clinical ASCVD: Yes  The 10-year ASCVD risk score (Arnett DK, et al., 2019) is: 61.9%   Values used to calculate the score:  Age: 26 years     Sex: Female     Is Non-Hispanic African American: No     Diabetic: Yes     Tobacco smoker: No     Systolic Blood Pressure: 494 mmHg     Is BP treated: Yes     HDL Cholesterol: 43.3 mg/dL     Total Cholesterol: 161 mg/dL       09/23/2021   10:48 AM 10/07/2020    8:55 AM 09/09/2020   10:50 AM  Depression screen PHQ 2/9  Decreased Interest 0 0 0  Down, Depressed, Hopeless 0 0 0  PHQ - 2 Score 0 0 0  Altered sleeping  0 0  Tired, decreased energy  0 0  Change in appetite  0 0  Feeling bad or failure about yourself   0 0  Trouble concentrating  0 0  Moving slowly or fidgety/restless  0 0  Suicidal thoughts  0 0  PHQ-9 Score  0 0  Difficult doing work/chores  Not difficult at all Not difficult at all     Social History   Tobacco Use  Smoking Status Never  Smokeless  Tobacco Never   BP Readings from Last 3 Encounters:  04/26/22 (!) 150/76  04/07/22 130/60  03/15/22 130/62   Pulse Readings from Last 3 Encounters:  04/26/22 80  04/07/22 (!) 108  03/15/22 78   Wt Readings from Last 3 Encounters:  04/26/22 148 lb (67.1 kg)  04/07/22 148 lb 3.2 oz (67.2 kg)  03/15/22 152 lb (68.9 kg)   BMI Readings from Last 3 Encounters:  04/26/22 26.22 kg/m  04/07/22 26.25 kg/m  03/15/22 26.93 kg/m    Assessment/Interventions: Review of patient past medical history, allergies, medications, health status, including review of consultants reports, laboratory and other test data, was performed as part of comprehensive evaluation and provision of chronic care management services.   SDOH:  (Social Determinants of Health) assessments and interventions performed: No - done Dec 2022  SDOH Screenings   Alcohol Screen: Low Risk  (09/23/2021)   Alcohol Screen    Last Alcohol Screening Score (AUDIT): 0  Depression (PHQ2-9): Low Risk  (09/23/2021)   Depression (PHQ2-9)    PHQ-2 Score: 0  Financial Resource Strain: Low Risk  (09/23/2021)   Overall Financial Resource Strain (CARDIA)    Difficulty of Paying Living Expenses: Not hard at all  Food Insecurity: No Food Insecurity (09/23/2021)   Hunger Vital Sign    Worried About Running Out of Food in the Last Year: Never true    Ran Out of Food in the Last Year: Never true  Housing: Low Risk  (09/23/2021)   Housing    Last Housing Risk Score: 0  Physical Activity: Insufficiently Active (09/23/2021)   Exercise Vital Sign    Days of Exercise per Week: 3 days    Minutes of Exercise per Session: 30 min  Social Connections: Moderately Integrated (09/23/2021)   Social Connection and Isolation Panel [NHANES]    Frequency of Communication with Friends and Family: More than three times a week    Frequency of Social Gatherings with Friends and Family: More than three times a week    Attends Religious Services: More than 4  times per year    Active Member of [employer] or Organizations: Yes    Attends Archivist Meetings: More than 4 times per year    Marital Status: Widowed  Stress: No Stress Concern Present (09/23/2021)   [place] -  Occupational Stress Questionnaire    Feeling of Stress : Not at all  Tobacco Use: Low Risk  (04/26/2022)   Patient History    Smoking Tobacco Use: Never    Smokeless Tobacco Use: Never    Passive Exposure: Not on file  Transportation Needs: No Transportation Needs (09/23/2021)   PRAPARE - Transportation    Lack of Transportation (Medical): No    Lack of Transportation (Non-Medical): No    CCM Care Plan  Allergies  Allergen Reactions   Iodinated Contrast Media Other (See Comments)    Itching (severe) and chest tightness   Penicillins Anaphylaxis, Swelling, Rash and Other (See Comments)    Has patient had a PCN reaction causing immediate rash, facial/tongue/throat swelling, SOB or lightheadedness with hypotension: Yes Has patient had a PCN reaction causing severe rash involving mucus membranes or skin necrosis: yes - remotely Has patient had a PCN reaction that required hospitalization: occurred while hospitalized Has patient had a PCN reaction occurring within the last 10 years: No If all of the above answers are "NO", then may proceed with Cephalosporin use.    Amlodipine Swelling    Pedal edema   Anectine [Succinylcholine] Other (See Comments)    Brother went into cardiac arrest.   Codeine Nausea Only   Gabapentin Other (See Comments)    Gait abnormality   Influenza Vaccines Other (See Comments)    Muscle weakness; unable to walk   Nortriptyline Other (See Comments)    Eye swelling and mouth drawed up   Pamelor [Nortriptyline Hcl] Other (See Comments)    Patient states caused her face to draw in together.   Valsartan Other (See Comments) and Cough    Allergy to generic only, "Hacking" cough   Zetia [Ezetimibe] Other (See  Comments)    Bad muscle cramps   Erythromycin Rash and Swelling   Sulfa Antibiotics Rash    Medications Reviewed Today     Reviewed by Madalyn Rob, LPN (Licensed Practical Nurse) on 04/26/22 at 9  Med List Status: <None>   Medication Order Taking? Sig Documenting Provider Last Dose Status Informant  acetaminophen (TYLENOL) 500 MG tablet [id number] Yes Take 1 tablet (500 mg total) by mouth 3 (three) times daily as needed. [name], MD Taking Active Self, Multiple Informants, Pharmacy Records  albuterol (VENTOLIN HFA) 108 (90 Base) MCG/ACT inhaler [id number] Yes Inhale 2 puffs into the lungs every 6 (six) hours as needed for wheezing or shortness of breath. Tyler Pita, MD Taking Active   Ascorbic Acid (VITAMIN C) 100 MG tablet [id number] Yes Take 100 mg by mouth daily. [provider] Taking Active Self, Multiple Informants, Pharmacy Records  BD ULTRA-FINE LANCETS lancets [id number] Yes Use as instructed upto 6 times daily Phadke, Karsten Ro, MD Taking Active Self, Multiple Informants, Pharmacy Records  carvedilol (COREG) 6.25 MG tablet [id number] Yes Take 1 tablet (6.25 mg total) by mouth 2 (two) times daily with a meal. Gollan, Kathlene November, MD Taking Active   Cholecalciferol (VITAMIN D3) 25 MCG (1000 UT) CAPS [id number] Yes Take 1 capsule (1,000 Units total) by mouth daily. [name], MD Taking Active   clopidogrel (PLAVIX) 75 MG tablet [id number] Yes TAKE ONE TABLET BY MOUTH EVERY EVENING Angelia Mould, MD Taking Active   COMFORT EZ PEN NEEDLES 32G X 4 MM [place] [id number] Yes Use 4-5x a day Philemon Kingdom, MD Taking Active Multiple Informants, Pharmacy Records, Self  Continuous Blood Gluc Sensor (FREESTYLE LIBRE 3 SENSOR) [place] [id number] Yes Place 1 sensor on  the skin every 14 days. Use to check glucose continuously [name], MD Taking Active   diclofenac Sodium (VOLTAREN) 1 % GEL [id number] Yes Apply 2 g topically 3 (three) times daily.  [name], MD Taking Active Multiple Informants, Pharmacy Records, Self  Fluticasone-Umeclidin-[facility] ELLIPTA) 200-62.5-25 MCG/ACT AEPB [id number] Yes Inhale 1 puff into the lungs daily. Tyler Pita, MD Taking Active   furosemide (LASIX) 20 MG tablet [id number] Yes Take 1 tablet (20 mg total) by mouth daily. Minna Merritts, MD Taking Active   Glucagon, rDNA, (GLUCAGON EMERGENCY) 1 MG KIT [id number] Yes INJECT into THE muscle ONCE AS NEEDED FOR emergency Philemon Kingdom, MD Taking Active Multiple Informants, Pharmacy Records, Self  Insulin Aspart FlexPen (NOVOLOG) 100 UNIT/ML [id number] Yes USE UP TO 50 UNITS DAILY in insulin pump  Patient taking differently: 10 - 12 units with each meal   Philemon Kingdom, MD Taking Active Multiple Informants, Pharmacy Records, Self  losartan (COZAAR) 25 MG tablet [id number] Yes Take 1 tablet (25 mg total) by mouth daily. Minna Merritts, MD Taking Active   Multiple Vitamin (MULTIVITAMIN ADULT) TABS [id number] Yes Take 1 tablet by mouth in the morning and at bedtime. [name], MD Taking Active   nitrofurantoin, macrocrystal-monohydrate, (MACROBID) 100 MG capsule [id number] Yes Take 1 capsule (100 mg total) by mouth 2 (two) times daily. [name], MD Taking Active   [facility] ULTRA test strip [id number] Yes USE TO check blood glucose FOUR TIMES DAILY Philemon Kingdom, MD Taking Active Multiple Informants, Pharmacy Records, Self  rosuvastatin (CRESTOR) 20 MG tablet [id number] Yes TAKE ONE TABLET BY MOUTH EVERY Recardo Evangelist, MD Taking Active   SYNTHROID 75 MCG tablet [id number] Yes TAKE ONE TABLET BY MOUTH Monday-Saturday BEFORE breakfast [name], MD Taking Active   topiramate (TOPAMAX) 50 MG tablet [id number] Yes Take 1 tablet (50 mg total) by mouth 2 (two) times daily. Penumalli, Earlean Polka, MD Taking Active Multiple Informants, Pharmacy Records, Self  TRESIBA FLEXTOUCH 100 UNIT/ML FlexTouch Pen [id number] Yes  Inject 46 Units into the skin daily.  Patient taking differently: Inject 56 Units into the skin daily.   Philemon Kingdom, MD Taking Active Multiple Informants, Pharmacy Records, Self            Patient Active Problem List   Diagnosis Date Noted   Dysuria 02/25/2022   Body aches 02/15/2022   Insomnia 02/01/2022   Osteopenia 01/15/2022   Dyspnea on exertion 12/22/2021   Sigmoid diverticulitis 05/26/2021   Unsteadiness 02/13/2021   Atherosclerosis of aorta ([place]) 12/16/2020   Neck pain 12/03/2020   Cause of injury, fall 09/01/2020   Grade II diastolic dysfunction 33/35/4562   Chronic venous insufficiency 04/07/2020   Bowel habit changes 02/18/2020   Diverticulosis    Partial small bowel obstruction ([place]) 01/15/2020   Epistaxis 01/13/2020   Cognitive changes 10/22/2019   Right sided abdominal pain 10/07/2019   Skin lesion 09/14/2019   Renal insufficiency 09/14/2019   Pain in right buttock 06/18/2019   Chronic midline thoracic back pain 05/14/2019   Sjogren's syndrome without extraglandular involvement ([place]) 02/26/2019   Epigastric discomfort 12/17/2018   PAD (peripheral artery disease) ([place]) 10/04/2018   Raynaud disease 09/04/2018   Amaurosis fugax, both eyes 06/18/2018   TIA (transient ischemic attack) 05/21/2018   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   Facial paresthesia 02/07/2018   Pain and swelling of lower leg 12/25/2017   Fibromyalgia    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), bilateral 08/21/2017   Intractable episodic headache 03/13/2017   Numbness  and tingling of both lower extremities 03/13/2017   6th nerve palsy, 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   Fatty liver 02/13/2017   LADA (latent autoimmune diabetes in adults), 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/03/2016   Medicare annual wellness visit, subsequent 06/19/2015   Health maintenance examination 06/19/2015   Advanced care planning/counseling discussion 06/19/2015   Carotid stenosis, 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  06/19/2015   Vitamin D deficiency    Family history of premature CAD 06/02/2015   Chest pain 05/26/2015   Elevated testosterone level in female 09/04/2014   Hyperlipidemia associated with type 2 diabetes mellitus ([place]) 07/30/2014   Essential hypertension 07/17/2014   Hypothyroidism due to Hashimoto's thyroiditis 07/17/2014   Apnea, sleep 08/22/2012   Chronic low back pain 02/27/2012   Positive ANA (antinuclear antibody) 01/06/2012    Immunization History  Administered Date(s) Administered   Pneumococcal Conjugate-13 09/23/2014   Pneumococcal Polysaccharide-23 01/05/2010, 07/17/2013   Tdap 10/07/2011    Conditions to be addressed/monitored:  Hypertension, Hyperlipidemia, Diabetes, Heart Failure, and Coronary Artery Disease  Care Plan : [place]  Updates made by Charlton Haws, [place] since 04/29/2022 12:00 AM     Problem: Hypertension, Hyperlipidemia, Diabetes, Heart Failure, and Coronary Artery Disease      Long-Range Goal: Disease mgmt   Start Date: 01/19/2021  Expected End Date: 04/14/2023  This Visit's Progress: On track  Recent Progress: On track  Priority: High  Note:   Current Barriers:  Unable to achieve control of Diabetes  Does not always maintain contact with provider office  Pharmacist Clinical Goal(s):  Patient will achieve improvement in DM as evidenced by CGM readings contact provider office for questions/concerns as evidenced notation of same in electronic health record through collaboration with PharmD and provider.   Interventions: 1:1 collaboration with [name], MD regarding development and update of comprehensive plan of care as evidenced by provider attestation and co-signature Inter-disciplinary care team collaboration (see longitudinal plan of care) Comprehensive medication review performed; medication list updated in electronic medical record  Hypertension / Heart Failure (BP goal <140/90) -Controlled - per clinic  readings within goal  -Last ejection fraction: 60-65% (Date: 11/2021) -HF type: Diastolic; NYHA Class: III (marked limitation of activity) -Current treatment: Carvedilol 6.25 mg BID -Appropriate, Effective, Safe, Accessible Losartan 25 mg daily -Appropriate, Effective, Safe, Accessible Furosemide 20 mg daily -Appropriate, Effective, Safe, Accessible -Medications previously tried: none  -Current home readings: none reported, usually well controlled at home -Current dietary habits: patient reports heart healthy diet -Denies hypotensive/hypertensive symptoms -Educated on BP goals and benefits of medications for prevention of heart attack, stroke and kidney damage; -Counseled to monitor BP at home weekly, document, and provide log at future appointments -Recommended to continue current medication  Hyperlipidemia: (LDL goal < 70) -Not ideally controlled - LDL 94 (09/2021) above goal; pt did not tolerate ezetimibe -Hx TIA, PAD, atherosclerosis -Current treatment: Rosuvastatin 20 mg daily - Appropriate, Query Effective [name] daily - Appropriate, Effective, Safe, Accessible -Medications previously tried: ezetimibe  -Educated on Cholesterol goals;  -Recommended to continue current medication; consider higher dose of rosuvastatin  Diabetes (A1c goal <8%) -Uncontrolled - A1c 11.0% (01/2022) -Follows with Dr Cruzita Lederer for LADA although recently has decided to see a new endocrinologist; came off insulin pump due to inability to get supplies -Current home glucose readings: pt does not have readings available -Denies hypoglycemic/hyperglycemic symptoms -Current medications: Tresiba 56 units daily -Appropriate, Query Effective Novolog 07-11-19 units (based on meal size) -Appropriate, Query Effective Glucagon PRN One  Touch supplies -Medications previously tried: insulin pump  -Educated on A1c and blood sugar goals; Continuous glucose monitoring; -Reviewed endocrine referral - many  locations are not accepting patients, sent patient list of clinics that are available and she will let Cassandra Benson know which one she would prefer -Completed training for [employer] today (see summary) -Recommended to continue current medication; coordinate referral to new endocrinologist per patient preference  SOB (Goal: diagnosis) -Not ideally controlled - pt is audibly short of breath on phone and gets progressively worse the longer she talks; she reports some improvement with inhalers -Established w/ pulm 04/07/22. CT chest - mild pulmonary fibrosis. Spirometry - mild airway obstruction -Current treatment  Trelegy 200-62.5-25 mcg/act 1 puff daily - Appropriate, Query Effective Albuterol HFA prn - Appropriate, Effective, Safe, Accessible -Medications previously tried: n/a  -Advised to take Trelegy daily and use albuterol PRN for breakthrough symptoms -Advised to follow up with pulmonology as scheduled  Patient Goals/Self-Care Activities Patient will:  - take medications as prescribed as evidenced by patient report and record review focus on medication adherence by routine check glucose fasting and with meals, document, and provide at future appointments       Medication Assistance: None required.  Patient affirms current coverage meets needs.  Compliance/Adherence/Medication fill history: Care Gaps: Eye exam (due 11/04/21)  Star-Rating Drugs: Rosuvastatin - PDC inaccurate  Medication Access: Within the past 30 days, how often has patient missed a dose of medication? 0 Is a pillbox or other method used to improve adherence? Yes  Factors that may affect medication adherence?  Insurance issues Are meds synced by current pharmacy? Yes  Are meds delivered by current pharmacy? Yes  Does patient experience delays in picking up medications due to transportation concerns? No   [facility] Reviewed: Is patient disadvantaged to use [facility]?: No  Current Rx insurance  plan: HTA Name and location of Current pharmacy:  [facility] - [place], [place] - [place] [address] [place] [address] Phone: [phone] Fax: [phone]  [facility] services reviewed with patient today?: Yes  - counted pills to determine new medication synchronization plan. Collaborated with pharmacy to implement paln Vials: Synthroid 75 mcg - #8 [name] #60 Losartan 25 mg #60 Carvedilol 3.174m #200 Rosuvastatin 250m#30 Furosemide 2056m30 Topiramate 50 mg #40   Care Plan and Follow Up Patient Decision:  Patient agrees to Care Plan and Follow-up.  Plan: Telephone follow up appointment with care management team member scheduled for:  1 month  LinCharlene BrookeharmD, BCASan Gorgonio Memorial Hospitalinical Pharmacist LeBMooreimary [facility] at [phone]

## 2022-04-26 ENCOUNTER — Ambulatory Visit (HOSPITAL_COMMUNITY)
Admission: RE | Admit: 2022-04-26 | Discharge: 2022-04-26 | Disposition: A | Discharge status: Home / Self Care | Payer: HMO | Source: Ambulatory Visit | Attending: Vascular Surgery | Admitting: Vascular Surgery

## 2022-04-26 ENCOUNTER — Encounter: Payer: Self-pay | Admitting: Vascular Surgery

## 2022-04-26 ENCOUNTER — Ambulatory Visit: Payer: HMO | Admitting: Vascular Surgery

## 2022-04-26 VITALS — BP 150/76 | HR 80 | Temp 97.8°F | Resp 14 | Ht 63.0 in | Wt 148.0 lb

## 2022-04-26 DIAGNOSIS — I6522 Occlusion and stenosis of left carotid artery: Secondary | ICD-10-CM

## 2022-04-26 NOTE — Progress Notes (Signed)
Patient name: Cassandra Benson MRN: [id number] DOB: 1941-01-13 Sex: female  REASON FOR VISIT: 1 year follow-up carotid surveillance, previous left TCAR  HPI: Cassandra Benson is a 81 y.o. female that presents for one year follow-up of her carotid disease.  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  She remains on aspirin Plavix statin.  Over the last year she has had some other health issues with congestive heart failure as well as difficulty controlling her diabetes.  She had no stroke symptoms other than some blurry vision that is nonfocal.  Past Medical History:  Diagnosis Date   Acute diverticulitis 04/2021   [facility], CT confirmed   Allergy    ANA positive    positive ANA pattern 1 speckled   Arthritis    Carotid stenosis, asymptomatic 06/19/2015   0-86% RICA 76-19% LICA rpt 1 yr (01/931)    CHF (congestive heart failure) ([place])    Colon polyps    COVID-19 virus infection 09/14/2021   Dermatomyositis ([place])    Diabetes mellitus without complication ([place])    Type 1   Diverticulosis    sigmoid on CT scan 12/2019   Family history of adverse reaction to anesthesia    brothr went into cardiac arrest from anectine   Fibromyalgia    prior PCP   GERD (gastroesophageal reflux disease)    prior PCP   Glaucoma    Narrow angle   History of blood clots    DVT, in 20s, none since   History of chicken pox    History of diverticulitis    History of pericarditis 1986   with hospitalization   History of pneumonia 2014   History of shingles    History of UTI    Hyperlipidemia    Hypertension    Hypothyroidism    Mixed connective tissue disease ([place])    Partial small bowel obstruction ([place]) 12/2019   managed conservatively   Peptic ulcer    Pneumonia    PONV (postoperative nausea and vomiting)    Raynaud's disease without gangrene    Shoulder pain left   h/o RTC tendonitis and  adhesive capsulitis   Sjogren's syndrome ([place])    Sleep apnea    prior PCP - no CPAP for about 10 yrs   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[place])    Vitamin D deficiency    prior PCP    Past Surgical History:  Procedure Laterality Date   ABDOMINAL HYSTERECTOMY  1978   fibroids and menorrhagia, ovaries remain   ARTERY BIOPSY Right 04/06/2018   Procedure: BIOPSY TEMPORAL ARTERY RIGHT;  Surgeon: Cassandra Benson;  Location: [facility];  Service: ENT;  Laterality: Right;  Diabetic - insulin pump sleep apnea   [facility] normal per patient   COLONOSCOPY  10/2011   1 TA, 1 HP, very tortuous colon (Cassandra Benson)   COLONOSCOPY  02/2020   TA, inflammatory polyp, mod diverticulosis, int/ext hemorrhoids (Cassandra Benson) no rpt recommended    COLONOSCOPY WITH ESOPHAGOGASTRODUODENOSCOPY (EGD)  03/2007   2 ulcers, benign polyp, rpt 5 yrs [facility], [facility])   JOINT REPLACEMENT Right    hip   PARTIAL HIP ARTHROPLASTY  2013   Right hip replacement   TONSILLECTOMY     TONSILLECTOMY AND ADENOIDECTOMY     TRANSCAROTID ARTERY REVASCULARIZATION  Left 07/23/2018   Procedure: TRANSCAROTID ARTERY REVASCULARIZATION;  Surgeon: Monica Martinez  J, Benson;  Location: [facility] OR;  Service: Vascular;  Laterality: Left;   TUBAL LIGATION     VAGINAL DELIVERY     x2, no complications    Family History  Problem Relation Age of Onset   CAD Mother 49       MI, aortic valve issues   COPD Mother    Lupus Mother    [name] Mother    Rheum arthritis Mother    CAD Father 85       CABG x2, aortic valve replacement   Stroke Sister    CAD Sister    60 Sister        brain   Lupus Sister    Diabetes Sister    Alcohol abuse Brother    CAD Brother 75       MI   Stroke Brother    Seizures Son    COPD Brother        agent orange   CAD Brother 64       stent   Diabetes Brother    Depression Grandchild    Breast cancer Maternal Aunt    Diabetes Sister    Breast cancer Sister     Breast cancer Maternal Aunt    Stroke Maternal Grandmother    Hypertension Maternal Grandmother    Gallbladder disease Maternal Grandmother    Colon cancer Neg Hx    Esophageal cancer Neg Hx    Rectal cancer Neg Hx    Stomach cancer Neg Hx     SOCIAL HISTORY: Social History   Tobacco Use   Smoking status: Never   Smokeless tobacco: Never  Substance Use Topics   Alcohol use: No    Allergies  Allergen Reactions   Iodinated Contrast Media Other (See Comments)    Itching (severe) and chest tightness   Penicillins Anaphylaxis, Swelling, Rash and Other (See Comments)    Has patient had a PCN reaction causing immediate rash, facial/tongue/throat swelling, SOB or lightheadedness with hypotension: Yes Has patient had a PCN reaction causing severe rash involving mucus membranes or skin necrosis: yes - remotely Has patient had a PCN reaction that required hospitalization: occurred while hospitalized Has patient had a PCN reaction occurring within the last 10 years: No If all of the above answers are "NO", then may proceed with Cephalosporin use.    Amlodipine Swelling    Pedal edema   Anectine [Succinylcholine] Other (See Comments)    Brother went into cardiac arrest.   Codeine Nausea Only   Gabapentin Other (See Comments)    Gait abnormality   Influenza Vaccines Other (See Comments)    Muscle weakness; unable to walk   Nortriptyline Other (See Comments)    Eye swelling and mouth drawed up   Pamelor [Nortriptyline Hcl] Other (See Comments)    Patient states caused her face to draw in together.   Valsartan Other (See Comments) and Cough    Allergy to generic only, "Hacking" cough   Zetia [Ezetimibe] Other (See Comments)    Bad muscle cramps   Erythromycin Rash and Swelling   Sulfa Antibiotics Rash    Current Outpatient Medications  Medication Sig Dispense Refill   acetaminophen (TYLENOL) 500 MG tablet Take 1 tablet (500 mg total) by mouth 3 (three) times daily as needed.      albuterol (VENTOLIN HFA) 108 (90 Base) MCG/ACT inhaler Inhale 2 puffs into the lungs every 6 (six) hours as needed for wheezing or shortness of breath. 8 g 2  Ascorbic Acid (VITAMIN C) 100 MG tablet Take 100 mg by mouth daily.     BD ULTRA-FINE LANCETS lancets Use as instructed upto 6 times daily 600 each 2   carvedilol (COREG) 6.25 MG tablet Take 1 tablet (6.25 mg total) by mouth 2 (two) times daily with a meal. 90 tablet 2   Cholecalciferol (VITAMIN D3) 25 MCG (1000 UT) CAPS Take 1 capsule (1,000 Units total) by mouth daily. 30 capsule    clopidogrel (PLAVIX) 75 MG tablet TAKE ONE TABLET BY MOUTH EVERY EVENING 30 tablet 6   COMFORT EZ PEN NEEDLES 32G X 4 MM MISC Use 4-5x a day 300 each 3   Continuous Blood Gluc Sensor (FREESTYLE LIBRE 3 SENSOR) MISC Place 1 sensor on the skin every 14 days. Use to check glucose continuously 2 each 3   diclofenac Sodium (VOLTAREN) 1 % GEL Apply 2 g topically 3 (three) times daily. 100 g 3   Fluticasone-Umeclidin-Vilant (TRELEGY ELLIPTA) 200-62.5-25 MCG/ACT AEPB Inhale 1 puff into the lungs daily. 14 each 0   furosemide (LASIX) 20 MG tablet Take 1 tablet (20 mg total) by mouth daily. 30 tablet 5   Glucagon, rDNA, (GLUCAGON EMERGENCY) 1 MG KIT INJECT into THE muscle ONCE AS NEEDED FOR emergency 1 kit 12   Insulin Aspart FlexPen (NOVOLOG) 100 UNIT/ML USE UP TO 50 UNITS DAILY in insulin pump (Patient taking differently: 10 - 12 units with each meal) 50 mL 0   losartan (COZAAR) 25 MG tablet Take 1 tablet (25 mg total) by mouth daily. 90 tablet 3   Multiple Vitamin (MULTIVITAMIN ADULT) TABS Take 1 tablet by mouth in the morning and at bedtime.     nitrofurantoin, macrocrystal-monohydrate, (MACROBID) 100 MG capsule Take 1 capsule (100 mg total) by mouth 2 (two) times daily. 14 capsule 0   ONETOUCH ULTRA test strip USE TO check blood glucose FOUR TIMES DAILY 400 strip 2   rosuvastatin (CRESTOR) 20 MG tablet TAKE ONE TABLET BY MOUTH EVERY EVENING 90 tablet 2    SYNTHROID 75 MCG tablet TAKE ONE TABLET BY MOUTH Monday-Saturday BEFORE breakfast 80 tablet 1   topiramate (TOPAMAX) 50 MG tablet Take 1 tablet (50 mg total) by mouth 2 (two) times daily. 60 tablet 12   TRESIBA FLEXTOUCH 100 UNIT/ML FlexTouch Pen Inject 46 Units into the skin daily. (Patient taking differently: Inject 56 Units into the skin daily.) 36 mL 1   No current facility-administered medications for this visit.    REVIEW OF SYSTEMS:  '[X]'  denotes positive finding, '[ ]'  denotes negative finding Cardiac  Comments:  Chest pain or chest pressure:    Shortness of breath upon exertion:    Short of breath when lying flat:    Irregular heart rhythm:        Vascular    Pain in calf, thigh, or hip brought on by ambulation:    Pain in feet at night that wakes you up from your sleep:     Blood clot in your veins:    Leg swelling:         Pulmonary    Oxygen at home:    Productive cough:     Wheezing:         Neurologic    Sudden weakness in arms or legs:     Sudden numbness in arms or legs:     Sudden onset of difficulty speaking or slurred speech:    Temporary loss of vision in one eye:     Problems with  dizziness:         Gastrointestinal    Blood in stool:     Vomited blood:         Genitourinary    Burning when urinating:     Blood in urine:        Psychiatric    Major depression:         Hematologic    Bleeding problems:    Problems with blood clotting too easily:        Skin    Rashes or ulcers:        Constitutional    Fever or chills:      PHYSICAL EXAM: Vitals:   04/26/22 1116 04/26/22 1121  BP: (!) 153/82 (!) 150/76  Pulse: 80 80  Resp: 14   Temp: 97.8 F (36.6 C)   TempSrc: Temporal   Weight: 148 lb (67.1 kg)   Height: '5\' 3"'  (1.6 m)     GENERAL: The patient is a well-nourished female, in no acute distress. The vital signs are documented above. CARDIAC: There is a regular rate and rhythm.  VASCULAR:  Left neck incision c/d/i PULMONARY: There  is good air exchange bilaterally without wheezing or rales. NEUROLOGIC: No focal weakness or paresthesias are detected.  CN II-XII grossly intact.  DATA:   Carotid duplex today shows widely patent left carotid stent with minimal contralateral disease in the right ICA in the 1-39% range  Assessment/Plan:  81 year old female that presents for 1 year follow-up for surveillance of her left carotid stent after left TCAR on 07/23/2018 for asymptomatic high-grade stenosis.  Discussed that her left carotid stent is widely patent on duplex today.  She has no significant contralateral disease.  She needs remain on dual antiplatelet therapy and statin for overall risk reduction.  Look forward to seeing her again in 1 year with repeat carotid duplex for ongoing surveillance.  No new concerns today.   Marty Heck, [facility] Office: [place]

## 2022-05-04 ENCOUNTER — Encounter: Payer: Self-pay | Admitting: Family Medicine

## 2022-05-04 ENCOUNTER — Ambulatory Visit (INDEPENDENT_AMBULATORY_CARE_PROVIDER_SITE_OTHER): Payer: HMO | Admitting: Family Medicine

## 2022-05-04 VITALS — BP 152/78 | HR 70 | Temp 97.3°F | Ht 63.0 in | Wt 151.0 lb

## 2022-05-04 DIAGNOSIS — E139 Other specified diabetes mellitus without complications: Secondary | ICD-10-CM | POA: Diagnosis not present

## 2022-05-04 DIAGNOSIS — R0609 Other forms of dyspnea: Secondary | ICD-10-CM | POA: Diagnosis not present

## 2022-05-04 DIAGNOSIS — I5189 Other ill-defined heart diseases: Secondary | ICD-10-CM | POA: Diagnosis not present

## 2022-05-04 DIAGNOSIS — I10 Essential (primary) hypertension: Secondary | ICD-10-CM | POA: Diagnosis not present

## 2022-05-04 MED ORDER — ONETOUCH ULTRA 2 W/DEVICE KIT
PACK | 0 refills | Status: DC
Start: 2022-05-04 — End: 2022-11-16

## 2022-05-04 MED ORDER — CLINDAMYCIN PHOSPHATE 1 % EX SOLN: Freq: Every day | CUTANEOUS | 0 refills | Status: DC

## 2022-05-04 MED ORDER — ASPIRIN 81 MG PO TBEC: 81.0000 mg | DELAYED_RELEASE_TABLET | Freq: Every day | ORAL | 12 refills | Status: DC

## 2022-05-04 MED ORDER — TRELEGY ELLIPTA 200-62.5-25 MCG/ACT IN AEPB: 1.0000 | INHALATION_SPRAY | Freq: Every day | RESPIRATORY_TRACT | 1 refills | Status: DC

## 2022-05-04 NOTE — Patient Instructions (Addendum)
Let me know if [facility] endocrinology is accepting new patients.  We will refer if available.  Keep follow up visit with Dr Patsey Berthold.  Schedule eye exam as you're due (Dr Edison Pace at [facility]  Foot exam today  Return as needed or in 3 months for follow up visit.

## 2022-05-04 NOTE — Progress Notes (Unsigned)
Patient ID: Cassandra Benson, female    DOB: 07/08/1941, 81 y.o.   MRN: [id number]  This visit was conducted in person.  BP (!) 152/78   Pulse 70   Temp (!) 97.3 F (36.3 C) (Temporal)   Ht _0  (1.6 m)   Wt 151 lb (68.5 kg)   SpO2 98%   BMI 26.75 kg/m    CC: 3 mo  f/u visit  Subjective:   HPI: Cassandra Benson is a 81 y.o. female presenting on 05/04/2022 for Follow-up (Here for 3 mo f/u.)   Saw [facility] for carotid stenosis s/p L TCAR 2019. Planned indefinite DAPT and statin therapy.   In process of transitioning between endocrinology offices. Planning to see [facility] Dr Honor Junes.  Saw Ria Comment pharmacist for [employer] 3 training.   Notes occ electrical shock pain to R shoulder into R chest, not exertional. FROM at shoulders and neck.  Requests refill of clindamycin phosphate topical solution 1% for acne. Initially prescribed by dermatology.   Saw pulm, cardiology since last seen. Notes reviewed.  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 Recent PFTs, planned labs. Preliminary - mild obstructive disease, mild diffusion defect.   BP a bit elevated today - this is despite regular carvedilol 6.22m bid, lasix 21mdaily, losartan 2527maily.   DM - does regularly check sugars with CGM. 10d data reviewed - averaging 190s, 50% in 180-250 range, 39% in 70-180 range. No low sugars. Compliant with antihyperglycemic regimen which includes: novolog up to 50u daily - not using insulin pump. Tresiba 46u daily. Denies low sugars or hypoglycemic symptoms. Denies paresthesias, blurry vision. Last diabetic eye exam 10/2020 - DUE. Glucometer brand: One Touch, now [employer] Last foot exam: DUE. DSME: has completed previously. Lab Results  Component Value Date   HGBA1C 11.0 (A) 02/08/2022   Diabetic Foot Exam - Simple   No data filed    Lab Results  Component Value Date   MICROALBUR 0.1 10/23/2014         Relevant past  medical, surgical, family and social history reviewed and updated as indicated. Interim medical history since our last visit reviewed. Allergies and medications reviewed and updated. Outpatient Medications Prior to Visit  Medication Sig Dispense Refill   acetaminophen (TYLENOL) 500 MG tablet Take 1 tablet (500 mg total) by mouth 3 (three) times daily as needed.     albuterol (VENTOLIN HFA) 108 (90 Base) MCG/ACT inhaler Inhale 2 puffs into the lungs every 6 (six) hours as needed for wheezing or shortness of breath. 8 g 2   Ascorbic Acid (VITAMIN C) 100 MG tablet Take 100 mg by mouth daily.     BD ULTRA-FINE LANCETS lancets Use as instructed upto 6 times daily 600 each 2   carvedilol (COREG) 6.25 MG tablet Take 1 tablet (6.25 mg total) by mouth 2 (two) times daily with a meal. 90 tablet 2   Cholecalciferol (VITAMIN D3) 25 MCG (1000 UT) CAPS Take 1 capsule (1,000 Units total) by mouth daily. 30 capsule    clopidogrel (PLAVIX) 75 MG tablet TAKE ONE TABLET BY MOUTH EVERY EVENING 30 tablet 6   COMFORT EZ PEN NEEDLES 32G X 4 MM MISC Use 4-5x a day 300 each 3   Continuous Blood Gluc Sensor (FREESTYLE LIBRE 3 SENSOR) MISC Place 1 sensor on the skin every 14 days. Use to check glucose continuously 2 each 3   diclofenac Sodium (VOLTAREN) 1 % GEL Apply  2 g topically 3 (three) times daily. 100 g 3   Fluticasone-Umeclidin-Vilant (TRELEGY ELLIPTA) 200-62.5-25 MCG/ACT AEPB Inhale 1 puff into the lungs daily. 14 each 0   furosemide (LASIX) 20 MG tablet Take 1 tablet (20 mg total) by mouth daily. 30 tablet 5   Glucagon, rDNA, (GLUCAGON EMERGENCY) 1 MG KIT INJECT into THE muscle ONCE AS NEEDED FOR emergency 1 kit 12   Insulin Aspart FlexPen (NOVOLOG) 100 UNIT/ML USE UP TO 50 UNITS DAILY in insulin pump (Patient taking differently: 10 - 12 units with each meal) 50 mL 0   losartan (COZAAR) 25 MG tablet Take 1 tablet (25 mg total) by mouth daily. 90 tablet 3   Multiple Vitamin (MULTIVITAMIN ADULT) TABS Take 1 tablet by  mouth in the morning and at bedtime.     nitrofurantoin, macrocrystal-monohydrate, (MACROBID) 100 MG capsule Take 1 capsule (100 mg total) by mouth 2 (two) times daily. 14 capsule 0   ONETOUCH ULTRA test strip USE TO check blood glucose FOUR TIMES DAILY 400 strip 2   rosuvastatin (CRESTOR) 20 MG tablet TAKE ONE TABLET BY MOUTH EVERY EVENING 90 tablet 2   SYNTHROID 75 MCG tablet TAKE ONE TABLET BY MOUTH Monday-Saturday BEFORE breakfast 80 tablet 1   topiramate (TOPAMAX) 50 MG tablet Take 1 tablet (50 mg total) by mouth 2 (two) times daily. 60 tablet 12   TRESIBA FLEXTOUCH 100 UNIT/ML FlexTouch Pen Inject 46 Units into the skin daily. (Patient taking differently: Inject 56 Units into the skin daily.) 36 mL 1   No facility-administered medications prior to visit.     Per HPI unless specifically indicated in ROS section below Review of Systems  Objective:  BP (!) 152/78   Pulse 70   Temp (!) 97.3 F (36.3 C) (Temporal)   Ht _0  (1.6 m)   Wt 151 lb (68.5 kg)   SpO2 98%   BMI 26.75 kg/m   Wt Readings from Last 3 Encounters:  05/04/22 151 lb (68.5 kg)  04/26/22 148 lb (67.1 kg)  04/07/22 148 lb 3.2 oz (67.2 kg)      Physical Exam    Results for orders placed or performed in visit on 02/25/22  Urine Culture   Specimen: Urine  Result Value Ref Range   MICRO NUMBER: [id number]    SPECIMEN QUALITY: Adequate    Sample Source URINE    STATUS: FINAL    Result:      Mixed genital flora isolated. These superficial bacteria are not indicative of a urinary tract infection. No further organism identification is warranted on this specimen. If clinically indicated, recollect clean-catch, mid-stream urine and transfer  immediately to Urine Culture Transport Tube.   POCT Urinalysis Dipstick (Automated)  Result Value Ref Range   Color, UA yellow    Clarity, UA clear    Glucose, UA Positive (A) Negative   Bilirubin, UA negative    Ketones, UA negative    Spec Grav, UA 1.010 1.010 - 1.025    Blood, UA negative    pH, UA 6.0 5.0 - 8.0   Protein, UA Negative Negative   Urobilinogen, UA 0.2 0.2 or 1.0 E.U./dL   Nitrite, UA negative    Leukocytes, UA Small (1+) (A) Negative    Assessment & Plan:   Problem List Items Addressed This Visit   None    No orders of the defined types were placed in this encounter.  No orders of the defined types were placed in this encounter.    ***  Follow up plan: No follow-ups on file.  [name], MD

## 2022-05-05 ENCOUNTER — Encounter: Payer: Self-pay | Admitting: Family Medicine

## 2022-05-05 NOTE — Assessment & Plan Note (Signed)
Chronic over several months, undergoing pulmonary evaluation, appreciate Dr Domingo Dimes care. Recent high resolution CT not conclusive for pulmonary fibrosis, pending PFTs (preliminary mild obstruction, mild diffusion defect), further labwork (advised call pulm office to schedule this). She has follow up office visit schedule for next month.

## 2022-05-05 NOTE — Assessment & Plan Note (Addendum)
She now has [employer] 3 - appreciate Lindsey's care. Recent data reviewed - averaging 190s. She continues tresiba 46 units daily.  In transition between endocrinology offices.  She would like to see Dr Honor Junes at [place] [facility].  This is closer for her as she lives in [place]. Referral placed.  Foot exam today. Due for eye exam - advised to schedule

## 2022-05-05 NOTE — Assessment & Plan Note (Addendum)
BP mildly elevated. She is on carvedilol, losartan and lasix. Consider increasing carvedilol or losartan. This is followed by cardiology.

## 2022-05-09 ENCOUNTER — Telehealth: Payer: Self-pay

## 2022-05-09 ENCOUNTER — Other Ambulatory Visit: Payer: Self-pay

## 2022-05-09 ENCOUNTER — Ambulatory Visit (INDEPENDENT_AMBULATORY_CARE_PROVIDER_SITE_OTHER): Payer: HMO

## 2022-05-09 ENCOUNTER — Encounter: Payer: Self-pay | Admitting: *Deleted

## 2022-05-09 ENCOUNTER — Ambulatory Visit
Admission: EM | Admit: 2022-05-09 | Discharge: 2022-05-09 | Disposition: A | Discharge status: Home / Self Care | Payer: HMO | Attending: Family Medicine | Admitting: Family Medicine

## 2022-05-09 DIAGNOSIS — W19XXXA Unspecified fall, initial encounter: Secondary | ICD-10-CM

## 2022-05-09 DIAGNOSIS — M25551 Pain in right hip: Secondary | ICD-10-CM | POA: Diagnosis not present

## 2022-05-09 MED ORDER — DICLOFENAC SODIUM 1 % EX GEL: 2.0000 g | Freq: Three times a day (TID) | CUTANEOUS | 0 refills | Status: DC

## 2022-05-09 MED ORDER — DEXAMETHASONE SODIUM PHOSPHATE 10 MG/ML IJ SOLN
5.0000 mg | Freq: Once | INTRAMUSCULAR | Status: AC
  Administered 2022-05-09: 5 mg via INTRAMUSCULAR

## 2022-05-09 NOTE — Telephone Encounter (Signed)
Lvm asking pt to call back.  Need to schedule OV for R hip/leg pain due to fall on 05/08/22.

## 2022-05-09 NOTE — ED Triage Notes (Signed)
Pt reports she fell in her home yesterday. Pt reports she remembers the fall and now has RT thigh and knee pain. Pt ambulatory at time of assessment.

## 2022-05-09 NOTE — Telephone Encounter (Signed)
Cassandra Benson was advised to be seen in 4 hours or ED if no available appointments. Advised no openings today and Manha said she will head to [facility] as soon as she can today.

## 2022-05-09 NOTE — ED Provider Notes (Signed)
Roderic Palau    CSN: [id number] Arrival date & time: 05/09/22  1325      History   Chief Complaint Chief Complaint  Patient presents with  . Fall  . Leg Pain  . Knee Pain    HPI Cassandra Benson is a 81 y.o. female.   HPI Patient presents for evaluation of right hip pain radiating down right knee following a fall x 1 day ago. Patient reports a total of 2 falls within this year in which she lost balance falling without tripping or experiencing dizziness.  She had a hip replacement involving the right hip more than 10 years ago  Past Medical History:  Diagnosis Date  . Acute diverticulitis 04/2021   [facility] ER, CT confirmed  . Allergy   . ANA positive    positive ANA pattern 1 speckled  . Arthritis   . Carotid stenosis, asymptomatic 06/19/2015   1-60% RICA 73-71% LICA rpt 1 yr (0/6269)   . CHF (congestive heart failure) ([place])   . Colon polyps   . COVID-19 virus infection 09/14/2021  . Dermatomyositis ([place])   . Diabetes mellitus without complication (HCC)    Type 1  . Diverticulosis    sigmoid on CT scan 12/2019  . Family history of adverse reaction to anesthesia    brothr went into cardiac arrest from anectine  . Fibromyalgia    prior PCP  . GERD (gastroesophageal reflux disease)    prior PCP  . Glaucoma    Narrow angle  . History of blood clots    DVT, in 20s, none since  . History of chicken pox   . History of diverticulitis   . History of pericarditis 1986   with hospitalization  . History of pneumonia 2014  . History of shingles   . History of UTI   . Hyperlipidemia   . Hypertension   . Hypothyroidism   . Mixed connective tissue disease ([place])   . Partial small bowel obstruction ([place]) 12/2019   managed conservatively  . Peptic ulcer   . Pneumonia   . PONV (postoperative nausea and vomiting)   . Raynaud's disease without gangrene   . Shoulder pain left   h/o RTC tendonitis and adhesive capsulitis  . Sigmoid diverticulitis 05/26/2021   . Sjogren's syndrome ([place])   . Sleep apnea    prior PCP - no CPAP for about 10 yrs  . 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[place])   . Vitamin D deficiency    prior PCP    Patient Active Problem List   Diagnosis Date Noted  . Dysuria 02/25/2022  . Body aches 02/15/2022  . Insomnia 02/01/2022  . Osteopenia 01/15/2022  . Chronic dyspnea 12/22/2021  . Unsteadiness 02/13/2021  . Atherosclerosis of aorta ([place]) 12/16/2020  . Neck pain 12/03/2020  . Cause of injury, fall 09/01/2020  . Grade II diastolic dysfunction 48/54/6270  . Chronic venous insufficiency 04/07/2020  . Bowel habit changes 02/18/2020  . Partial small bowel obstruction ([place]) 01/15/2020  . Epistaxis 01/13/2020  . Cognitive changes 10/22/2019  . Right sided abdominal pain 10/07/2019  . Skin lesion 09/14/2019  . Renal insufficiency 09/14/2019  . Pain in right buttock 06/18/2019  . Chronic midline thoracic back pain 05/14/2019  . Sjogren's syndrome without extraglandular involvement ([place]) 02/26/2019  . Epigastric discomfort 12/17/2018  . PAD (peripheral artery disease) ([place]) 10/04/2018  . Raynaud disease 09/04/2018  . Amaurosis fugax, both eyes 06/18/2018  . TIA (transient ischemic attack) 05/21/2018  . 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  Facial paresthesia 02/07/2018  . Pain and swelling of lower leg 12/25/2017  . Fibromyalgia   . 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 Intractable episodic headache 03/13/2017  . Numbness and tingling of both lower extremities 03/13/2017  . 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 Fatty liver 02/13/2017  . LADA (latent autoimmune diabetes in adults), managed as type 1 ([place]) 10/03/2016  . Medicare annual wellness visit, subsequent 06/19/2015  . Health maintenance examination 06/19/2015  . Advanced care planning/counseling discussion 06/19/2015  . 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 Vitamin D deficiency   . Family history of premature CAD  06/02/2015  . Chest pain 05/26/2015  . 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 Hyperlipidemia associated with type 2 diabetes mellitus ([place]) 07/30/2014  . Essential hypertension 07/17/2014  . Hypothyroidism due to Hashimoto's thyroiditis 07/17/2014  . Apnea, sleep 08/22/2012  . Chronic low back pain 02/27/2012  . Positive ANA (antinuclear antibody) 01/06/2012    Past Surgical History:  Procedure Laterality Date  . ABDOMINAL HYSTERECTOMY  1978   fibroids and menorrhagia, ovaries remain  . ARTERY BIOPSY Right 04/06/2018   Procedure: BIOPSY TEMPORAL ARTERY RIGHT;  Surgeon: Beverly Gust, MD;  Location: [facility];  Service: ENT;  Laterality: Right;  Diabetic - insulin pump sleep apnea  . [facility] normal per patient  . COLONOSCOPY  10/2011   1 TA, 1 HP, very tortuous colon ([place])  . COLONOSCOPY  02/2020   TA, inflammatory polyp, mod diverticulosis, int/ext hemorrhoids ([place]) no rpt recommended   . COLONOSCOPY WITH ESOPHAGOGASTRODUODENOSCOPY (EGD)  03/2007   2 ulcers, benign polyp, rpt 5 yrs ([place], [place])  . JOINT REPLACEMENT Right    hip  . PARTIAL HIP ARTHROPLASTY  2013   Right hip replacement  . TONSILLECTOMY    . TONSILLECTOMY AND ADENOIDECTOMY    . TRANSCAROTID ARTERY REVASCULARIZATION  Left 07/23/2018   Procedure: TRANSCAROTID ARTERY REVASCULARIZATION;  Surgeon: Marty Heck, MD;  Location: [facility];  Service: Vascular;  Laterality: Left;  . TUBAL LIGATION    . VAGINAL DELIVERY     x2, no complications    OB History   No obstetric history on file.      Home Medications    Prior to Admission medications   Medication Sig Start Date End Date Taking? Authorizing Provider  acetaminophen (TYLENOL) 500 MG tablet Take 1 tablet (500 mg total) by mouth 3 (three) times daily as needed. 01/03/20   [name], MD  albuterol (VENTOLIN HFA) 108 (90 Base) MCG/ACT inhaler Inhale 2 puffs into the  lungs every 6 (six) hours as needed for wheezing or shortness of breath. 04/07/22   Tyler Pita, MD  Ascorbic Acid (VITAMIN C) 100 MG tablet Take 100 mg by mouth daily.    [provider]  aspirin EC 81 MG tablet Take 1 tablet (81 mg total) by mouth daily. Swallow whole. 05/04/22   [name], MD  BD ULTRA-FINE LANCETS lancets Use as instructed upto 6 times daily 09/02/14   Phadke, Karsten Ro, MD  Blood Glucose Monitoring Suppl (ONE TOUCH ULTRA 2) w/Device KIT Use as directed to check sugars twice daily and as needed 05/04/22   [name], MD  carvedilol (COREG) 6.25 MG tablet Take 1 tablet (6.25 mg total) by mouth 2 (two) times daily with a meal. 04/11/22   Gollan, Kathlene November, MD  Cholecalciferol (VITAMIN D3) 25 MCG (1000 UT) CAPS Take  1 capsule (1,000 Units total) by mouth daily. 01/18/22   [name], MD  clindamycin (CLEOCIN T) 1 % external solution Apply topically daily. As needed for acne 05/04/22   [name], MD  clopidogrel (PLAVIX) 75 MG tablet TAKE ONE TABLET BY MOUTH EVERY EVENING 03/17/22   Angelia Mould, MD  COMFORT EZ PEN NEEDLES 32G X 4 MM MISC Use 4-5x a day 11/18/21   Philemon Kingdom, MD  Continuous Blood Gluc Sensor (FREESTYLE LIBRE 3 SENSOR) MISC Place 1 sensor on the skin every 14 days. Use to check glucose continuously 04/25/22   [name], MD  diclofenac Sodium (VOLTAREN) 1 % GEL Apply 2 g topically 3 (three) times daily. 03/15/20   [name], MD  Fluticasone-Umeclidin-Vilant (TRELEGY ELLIPTA) 200-62.5-25 MCG/ACT AEPB Inhale 1 puff into the lungs daily. 05/04/22   [name], MD  furosemide (LASIX) 20 MG tablet Take 1 tablet (20 mg total) by mouth daily. 03/28/22 03/28/23  Minna Merritts, MD  Glucagon, rDNA, (GLUCAGON EMERGENCY) 1 MG KIT INJECT into THE muscle ONCE AS NEEDED FOR emergency 11/18/21   Philemon Kingdom, MD  Insulin Aspart FlexPen (NOVOLOG) 100 UNIT/ML USE UP TO 50 UNITS DAILY in insulin pump Patient taking  differently: 10 - 12 units with each meal 05/20/21   Philemon Kingdom, MD  losartan (COZAAR) 25 MG tablet Take 1 tablet (25 mg total) by mouth daily. 03/15/22   Minna Merritts, MD  Multiple Vitamin (MULTIVITAMIN ADULT) TABS Take 1 tablet by mouth in the morning and at bedtime. 01/18/22   [name], MD  nitrofurantoin, macrocrystal-monohydrate, (MACROBID) 100 MG capsule Take 1 capsule (100 mg total) by mouth 2 (two) times daily. 02/26/22   [name], MD  [facility] ULTRA test strip USE TO check blood glucose FOUR TIMES DAILY 10/14/21   Philemon Kingdom, MD  rosuvastatin (CRESTOR) 20 MG tablet TAKE ONE TABLET BY MOUTH EVERY EVENING 01/12/22   [name], MD  SYNTHROID 75 MCG tablet TAKE ONE TABLET BY MOUTH Monday-Saturday BEFORE breakfast 03/14/22   [name], MD  topiramate (TOPAMAX) 50 MG tablet Take 1 tablet (50 mg total) by mouth 2 (two) times daily. 09/08/21   Penumalli, Earlean Polka, MD  TRESIBA FLEXTOUCH 100 UNIT/ML FlexTouch Pen Inject 46 Units into the skin daily. Patient taking differently: Inject 56 Units into the skin daily. 12/14/21   Philemon Kingdom, MD    Family History Family History  Problem Relation Age of Onset  . CAD Mother 5       MI, aortic valve issues  . COPD Mother   . Lupus Mother   . Graves' disease Mother   . Rheum arthritis Mother   . CAD Father 26       CABG x2, aortic valve replacement  . Stroke Sister   . CAD Sister   . Anuerysm Sister        brain  . Lupus Sister   . Diabetes Sister   . Alcohol abuse Brother   . CAD Brother 84       MI  . Stroke Brother   . Seizures Son   . COPD Brother        agent orange  . CAD Brother 63       stent  . Diabetes Brother   . Depression Grandchild   . Breast cancer Maternal Aunt   . Diabetes Sister   . Breast cancer Sister   . Breast cancer Maternal Aunt   . Stroke Maternal Grandmother   .  Hypertension Maternal Grandmother   . Gallbladder disease Maternal Grandmother   . Colon  cancer Neg Hx   . Esophageal cancer Neg Hx   . Rectal cancer Neg Hx   . Stomach cancer Neg Hx     Social History Social History   Tobacco Use  . Smoking status: Never  . Smokeless tobacco: Never  Vaping Use  . Vaping Use: Never used  Substance Use Topics  . Alcohol use: No  . Drug use: No     Allergies   Iodinated contrast media, Penicillins, Amlodipine, Anectine [succinylcholine], Codeine, Gabapentin, Influenza vaccines, Nortriptyline, Pamelor [nortriptyline hcl], Valsartan, Zetia [ezetimibe], Erythromycin, and Sulfa antibiotics   Review of Systems Review of Systems   Physical Exam Triage Vital Signs ED Triage Vitals  Enc Vitals Group     BP 05/09/22 1439 129/73     Pulse Rate 05/09/22 1439 97     Resp 05/09/22 1439 16     Temp 05/09/22 1439 98.8 F (37.1 C)     Temp Source 05/09/22 1439 Oral     SpO2 05/09/22 1439 96 %     Weight --      Height --      Head Circumference --      Peak Flow --      Pain Score 05/09/22 1518 7     Pain Loc --      Pain Edu? --      Excl. in [place]? --    No data found.  Updated Vital Signs BP 135/74   Pulse 90   Temp 97.8 F (36.6 C)   Resp 18   SpO2 96%   Visual Acuity Right Eye Distance:   Left Eye Distance:   Bilateral Distance:    Right Eye Near:   Left Eye Near:    Bilateral Near:     Physical Exam   UC Treatments / Results  Labs (all labs ordered are listed, but only abnormal results are displayed) Labs Reviewed - No data to display  EKG   Radiology DG Hip Unilat W or Wo Pelvis 2-3 Views Right  Result Date: 05/09/2022 CLINICAL DATA:  RIGHT hip pain post fall EXAM: DG HIP (WITH OR WITHOUT PELVIS) 2-3V RIGHT COMPARISON:  11/12/2021 FINDINGS: Osseous demineralization. RIGHT hip prosthesis. SI joints and LEFT hip joint space preserved. No acute fracture, dislocation, or bone destruction. Numerous pelvic phleboliths. IMPRESSION: RIGHT hip prosthesis. No acute abnormalities. Electronically Signed   By: Lavonia Dana M.D.   On: 05/09/2022 15:15    Procedures Procedures (including critical care time)  Medications Ordered in UC Medications - No data to display  Initial Impression / Assessment and Plan / UC Course  I have reviewed the triage vital signs and the nursing notes.  Pertinent labs & imaging results that were available during my care of the patient were reviewed by me and considered in my medical decision making (see chart for details).     *** Final Clinical Impressions(s) / UC Diagnoses   Final diagnoses:  Fall, initial encounter  Hip pain, right   Discharge Instructions   None    ED Prescriptions   None    PDMP not reviewed this encounter.

## 2022-05-09 NOTE — Telephone Encounter (Signed)
Patient called in stating that she fell yesterday in the house, and landed on her R hip. She said she has been having severe pain in the hip since the fall and is concerned as she states it was the hardest fall she has ever had. Did have patient speak with triage.

## 2022-05-09 NOTE — Telephone Encounter (Signed)
Reviewed chart do not see where patient is at ED yet.   [facility] - Client TELEPHONE ADVICE RECORD [facility] Patient Name: Cassandra Benson Gender: Female DOB: 03/19/1941 Age: 81 Y 10 M 12 D Return Phone Number: [phone] (Primary), [phone] (Secondary) Address[place]  [address] Client [facility] - Client Client Site [place] Provider [name] - MD Contact Type Call Who Is Calling Patient / Member / Family / Caregiver Call Type Triage / Clinical Relationship To Patient Self Return Phone Number [phone] (Secondary) Chief Complaint Leg Injury Reason for Call Symptomatic / Request for [place] states she fell yesterday and and hurt her right hip and leg. Pain is worse today. Translation No Nurse Assessment Nurse: Thad Ranger, RN, Denise Date/Time (Eastern Time): 05/09/2022 8:47:58 AM Confirm and document reason for call. If symptomatic, describe symptoms. ---Caller states she fell yesterday and and hurt her right hip and leg. Pain is worse today. Does the patient have any new or worsening symptoms? ---Yes Will a triage be completed? ---Yes Related visit to physician within the last 2 weeks? ---No Does the PT have any chronic conditions? (i.e. diabetes, asthma, this includes High risk factors for pregnancy, etc.) ---Yes List chronic conditions. ---IDDM Is this a behavioral health or substance abuse call? ---No Guidelines Guideline Title Affirmed Question Affirmed Notes Nurse Date/Time (Eastern Time) Hip Injury [1] SEVERE pain AND [2] not improved 2 hours after pain medicine/ ice packs Carmon, RN, Langley Gauss 05/09/2022 8:49:40 AM Disp. Time Eilene Ghazi Time) Disposition Final User 05/09/2022 8:51:41 AM See HCP within 4 Hours (or PCP triage) Yes Carmon, RN, Langley Gauss PLEASE NOTE: All timestamps contained within  this report are represented as [place] Standard Time. CONFIDENTIALTY NOTICE: This fax transmission is intended only for the addressee. It contains information that is legally privileged, confidential or otherwise protected from use or disclosure. If you are not the intended recipient, you are strictly prohibited from reviewing, disclosing, copying using or disseminating any of this information or taking any action in reliance on or regarding this information. If you have received this fax in error, please notify us immediately by telephone so that we can arrange for its return to [place]. Phone: [phone], Toll-Free: [phone], Fax: [phone] Page: 2 of 2 Call Id: [id number] Final Disposition 05/09/2022 8:51:41 AM See HCP within 4 Hours (or PCP triage) Yes Carmon, RN, Yevette Edwards Disagree/Comply Comply Caller Understands Yes PreDisposition Call Doctor Care Advice Given Per Guideline SEE HCP (OR PCP TRIAGE) WITHIN 4 HOURS: NO STANDING: * Try not to put any weight on the injured leg. ANOTHER ADULT SHOULD DRIVE: * It is better and safer if another adult drives instead of you. USE A COLD PACK FOR PAIN, SWELLING, OR BRUISING: * Put a cold pack or an ice bag (wrapped in a moist towel) on the area for 20 minutes. PAIN MEDICINES: * ACETAMINOPHEN - EXTRA STRENGTH TYLENOL: Take 1,000 mg (two 500 mg pills) every 6 to 8 hours as needed. Each Extra Strength Tylenol pill has 500 mg of acetaminophen. The most you should take is 6 pills a day (3,000 mg total). Note: In [place], the maximum is 8 pills a day (4,000 mg total). CALL BACK IF: * You become worse CARE ADVICE given per Hip Injury (Adult) guideline. Referrals REFERRED TO PCP OFFICE

## 2022-05-09 NOTE — Discharge Instructions (Addendum)
Continue Tylenol as needed for pain. I have also refilled your Voltaren Gel, you can apply the Gel directly to area of your hip and knee is painful. Follow-up with your Primary Care Doctor regarding your recurrent falls.  I have included information for your to follow-up with [facility] regarding hip pain if pain persists.

## 2022-05-10 ENCOUNTER — Encounter: Payer: Self-pay | Admitting: Emergency Medicine

## 2022-05-10 ENCOUNTER — Emergency Department
Admission: EM | Admit: 2022-05-10 | Discharge: 2022-05-10 | Disposition: A | Discharge status: Home / Self Care | Payer: HMO | Attending: Emergency Medicine | Admitting: Emergency Medicine

## 2022-05-10 DIAGNOSIS — R0789 Other chest pain: Secondary | ICD-10-CM | POA: Diagnosis not present

## 2022-05-10 DIAGNOSIS — E1165 Type 2 diabetes mellitus with hyperglycemia: Secondary | ICD-10-CM | POA: Diagnosis not present

## 2022-05-10 DIAGNOSIS — I10 Essential (primary) hypertension: Secondary | ICD-10-CM | POA: Diagnosis not present

## 2022-05-10 DIAGNOSIS — Z794 Long term (current) use of insulin: Secondary | ICD-10-CM | POA: Diagnosis not present

## 2022-05-10 DIAGNOSIS — R739 Hyperglycemia, unspecified: Secondary | ICD-10-CM | POA: Diagnosis present

## 2022-05-10 LAB — URINALYSIS, ROUTINE W REFLEX MICROSCOPIC
Bacteria, UA: NONE SEEN
Bilirubin Urine: NEGATIVE
Glucose, UA: >=500 mg/dL — AB
Hgb urine dipstick: NEGATIVE
Ketones, ur: NEGATIVE mg/dL
Leukocytes,Ua: NEGATIVE
Nitrite: NEGATIVE
Protein, ur: NEGATIVE mg/dL
Specific Gravity, Urine: 1.014 (ref 1.005–1.030)
pH: 6 (ref 5.0–8.0)

## 2022-05-10 LAB — BASIC METABOLIC PANEL
Anion gap: 9 (ref 5–15)
BUN: 22 mg/dL (ref 8–23)
CO2: 18 mmol/L — ABNORMAL LOW (ref 22–32)
Calcium: 9.2 mg/dL (ref 8.9–10.3)
Chloride: 107 mmol/L (ref 98–111)
Creatinine, Ser: 0.91 mg/dL (ref 0.44–1.00)
GFR, Estimated: >60 mL/min (ref >60)
Glucose, Bld: 503 mg/dL (ref 70–99)
Potassium: 3.6 mmol/L (ref 3.5–5.1)
Sodium: 134 mmol/L — ABNORMAL LOW (ref 135–145)

## 2022-05-10 LAB — CBC
HCT: 41.6 % (ref 36.0–46.0)
Hemoglobin: 13.3 g/dL (ref 12.0–15.0)
MCH: 26.8 pg (ref 26.0–34.0)
MCHC: 32 g/dL (ref 30.0–36.0)
MCV: 83.7 fL (ref 80.0–100.0)
Platelets: 210 10*3/uL (ref 150–400)
RBC: 4.97 MIL/uL (ref 3.87–5.11)
RDW: 13.7 % (ref 11.5–15.5)
WBC: 13.3 10*3/uL — ABNORMAL HIGH (ref 4.0–10.5)
nRBC: 0 % (ref 0.0–0.2)

## 2022-05-10 LAB — TROPONIN I (HIGH SENSITIVITY): Troponin I (High Sensitivity): 4 ng/L (ref <18)

## 2022-05-10 LAB — CBG MONITORING, ED
Glucose-Capillary: 515 mg/dL (ref 70–99)
Glucose-Capillary: 285 mg/dL — ABNORMAL HIGH (ref 70–99)

## 2022-05-10 MED ORDER — POTASSIUM CHLORIDE CRYS ER 20 MEQ PO TBCR
40.0000 meq | EXTENDED_RELEASE_TABLET | Freq: Once | ORAL | Status: AC
  Administered 2022-05-10: 40 meq via ORAL
  Filled 2022-05-10: qty 2

## 2022-05-10 MED ORDER — INSULIN ASPART 100 UNIT/ML IJ SOLN
10.0000 [IU] | Freq: Once | INTRAMUSCULAR | Status: AC
  Administered 2022-05-10: 10 [IU] via INTRAVENOUS
  Filled 2022-05-10: qty 1

## 2022-05-10 MED ORDER — LACTATED RINGERS IV BOLUS
1000.0000 mL | Freq: Once | INTRAVENOUS | Status: AC
  Administered 2022-05-10: 1000 mL via INTRAVENOUS

## 2022-05-10 NOTE — ED Triage Notes (Signed)
Patient to ED for hyperglycemia. Patient states glucometer read above 600. Patient currently taking prednisone. Patient states she has taken her insulin this AM. Patient now stating that she also has chest tightness- hx of CHF.

## 2022-05-10 NOTE — Telephone Encounter (Addendum)
Looks like pt was seen at [facility] 05/09/22.  Spoke with pt to schedule ER f/u for fall.  However, pt states she doesn't feel good and she's going to ER.  Says was prescribed prednisone and her BS is 600 this AM.  Fyi to Dr. Darnell Level.   Pt will call back to schedule ER f/u.

## 2022-05-10 NOTE — ED Provider Notes (Signed)
[facility] Provider Note    Event Date/Time   First MD Initiated Contact with Patient 05/10/22 1341     (approximate)   History   Chief Complaint: Hyperglycemia   HPI  Cassandra Benson is a 81 y.o. female with a past history of Sjogren syndrome, diabetes, hypertension who comes the ED complaining of hyperglycemia.  She is in her usual state of health, was recently treated with steroids, and noticed last night that her blood sugar became above 600 on her glucometer.  She is having urinary frequency.  Denies dysuria, fever or any other acute complaints.  No abdominal pain or vomiting.  She has been compliant with her insulin regimen including Tresiba and subcutaneous aspart with meals.     Physical Exam   Triage Vital Signs: ED Triage Vitals  Enc Vitals Group     BP 05/10/22 0955 (!) 133/92     Pulse Rate 05/10/22 0955 91     Resp 05/10/22 0955 18     Temp 05/10/22 0955 98.3 F (36.8 C)     Temp Source 05/10/22 0955 Oral     SpO2 05/10/22 0955 98 %     Weight 05/10/22 0956 163 lb (73.9 kg)     Height 05/10/22 0956 '5\' 3"'$  (1.6 m)     Head Circumference --      Peak Flow --      Pain Score 05/10/22 0956 5     Pain Loc --      Pain Edu? --      Excl. in [place]? --     Most recent vital signs: Vitals:   05/10/22 0955  BP: (!) 133/92  Pulse: 91  Resp: 18  Temp: 98.3 F (36.8 C)  SpO2: 98%    General: Awake, no distress.  CV:  Good peripheral perfusion.  Regular rate and rhythm Resp:  Normal effort.  Clear to auscultation bilaterally Abd:  No distention.  Soft and nontender Other:  Moist oral mucosa, no rash, no lower extremity edema.  Nontoxic   ED Results / Procedures / Treatments   Labs (all labs ordered are listed, but only abnormal results are displayed) Labs Reviewed  BASIC METABOLIC PANEL - Abnormal; Notable for the following components:      Result Value   Sodium 134 (*)    CO2 18 (*)    Glucose, Bld 503 (*)    All other  components within normal limits  URINALYSIS, ROUTINE W REFLEX MICROSCOPIC - Abnormal; Notable for the following components:   Color, Urine COLORLESS (*)    APPearance CLEAR (*)    Glucose, UA >=500 (*)    All other components within normal limits  CBC - Abnormal; Notable for the following components:   WBC 13.3 (*)    All other components within normal limits  CBG MONITORING, ED - Abnormal; Notable for the following components:   Glucose-Capillary 515 (*)    All other components within normal limits  CBG MONITORING, ED  TROPONIN I (HIGH SENSITIVITY)     EKG Interpreted by me Normal sinus rhythm rate of 87.  Normal axis and intervals.  Normal QRS ST segments and T waves.   RADIOLOGY    PROCEDURES:  Procedures   MEDICATIONS ORDERED IN ED: Medications  lactated ringers bolus 1,000 mL (1,000 mLs Intravenous New Bag/Given 05/10/22 1440)  insulin aspart (novoLOG) injection 10 Units (10 Units Intravenous Given 05/10/22 1443)  potassium chloride SA (KLOR-CON M) CR tablet 40 mEq (40  mEq Oral Given 05/10/22 1427)     IMPRESSION / MDM / ASSESSMENT AND PLAN / ED COURSE  I reviewed the triage vital signs and the nursing notes.                              Differential diagnosis includes, but is not limited to, dehydration, hyperglycemia, AKI, electrolyte abnormality, metabolic acidosis, UTI  Patient's presentation is most consistent with acute presentation with potential threat to life or bodily function.  Patient presents with hyperglycemia in the setting of insulin-dependent diabetes recently treated with steroids.  Labs are reassuring, no acidosis/DKA.  No signs of infection.  Urinalysis is unremarkable, vital signs reassuring.  We will give the patient IV fluids for hydration, oral potassium replacement, IV aspart for glycemic control.  Anticipate she will be suitable for discharge home afterward with CBG recheck.       FINAL CLINICAL IMPRESSION(S) / ED DIAGNOSES   Final  diagnoses:  Type 2 diabetes mellitus with hyperglycemia, with long-term current use of insulin ([place])     Rx / DC Orders   ED Discharge Orders     None        Note:  This document was prepared using Dragon voice recognition software and may include unintentional dictation errors.   Carrie Mew, MD 05/10/22 1527

## 2022-05-11 NOTE — Telephone Encounter (Addendum)
Seen at ER, discharged after IV fluids and insulin. Was previously treated at [facility] with decadron '5mg'$  IM after fall with hip pain. How's hip doing?  How have sugars been since discharge from ER? Please have her schedule appt if sugars not improving over time.

## 2022-05-12 NOTE — Telephone Encounter (Signed)
Spoke with pt asking about hip pain.  States it's still painful but not as bad.  Takes Tylenol PRN. Reports recent BS readings: 357, 226, 270.  Pt scheduled OV on 05/16/22 at 12:00.

## 2022-05-12 NOTE — Telephone Encounter (Signed)
Endo referral previously placed Onetouch ultra meter also already sent.

## 2022-05-16 ENCOUNTER — Encounter: Payer: Self-pay | Admitting: Family Medicine

## 2022-05-16 ENCOUNTER — Ambulatory Visit (INDEPENDENT_AMBULATORY_CARE_PROVIDER_SITE_OTHER): Payer: HMO | Admitting: Family Medicine

## 2022-05-16 VITALS — BP 128/66 | HR 64 | Temp 97.8°F | Ht 63.0 in | Wt 151.0 lb

## 2022-05-16 DIAGNOSIS — E139 Other specified diabetes mellitus without complications: Secondary | ICD-10-CM

## 2022-05-16 DIAGNOSIS — T380X5A Adverse effect of glucocorticoids and synthetic analogues, initial encounter: Secondary | ICD-10-CM | POA: Insufficient documentation

## 2022-05-16 DIAGNOSIS — W19XXXA Unspecified fall, initial encounter: Secondary | ICD-10-CM | POA: Diagnosis not present

## 2022-05-16 DIAGNOSIS — M79651 Pain in right thigh: Secondary | ICD-10-CM | POA: Diagnosis not present

## 2022-05-16 DIAGNOSIS — R0609 Other forms of dyspnea: Secondary | ICD-10-CM

## 2022-05-16 DIAGNOSIS — R739 Hyperglycemia, unspecified: Secondary | ICD-10-CM

## 2022-05-16 NOTE — Progress Notes (Signed)
Patient ID: Cassandra Benson, female    DOB: 01/30/1941, 81 y.o.   MRN: [id number]  This visit was conducted in person.  BP 128/66   Pulse 64   Temp 97.8 F (36.6 C) (Temporal)   Ht _0  (1.6 m)   Wt 151 lb (68.5 kg)   SpO2 99%   BMI 26.75 kg/m    CC: R hip pain after fall  Subjective:   HPI: Cassandra Benson is a 81 y.o. female presenting on 05/16/2022 for Hip Pain (C/o R hip pain due to fall on 05/08/22.  Also c/o recent higher BS readings than normal.  Wants to discuss meds. ) and Chest Pain (C/o chest pain this morning while preparing for today's OV.  H/o occasional chest pain and Dr. Rockey Situ [cards] is aware. )   DOI: 05/08/2022 suffered fall at home onto R hip (in h/o R hip replacement) - doesn't know how she fell but no LOC, didn't trip, doesn't think sugar related.  Evaluated at [facility] negative for hip fracture or prosthesis displacement. Treated with 64m dexamethasone IM which raised sugar levels to 600 - subsequently evaluated at ER on 05/10/2022 for hyperglycemia, treated with IV fluids and insulin.  She continues voltaren gel topically.   Feels trelegy is helping her dyspnea. Has upcoming pulm appt for next month.   Sugars are slowly coming down. This morning 288.   Still pending establishing with endo - [place]      Relevant past medical, surgical, family and social history reviewed and updated as indicated. Interim medical history since our last visit reviewed. Allergies and medications reviewed and updated. Outpatient Medications Prior to Visit  Medication Sig Dispense Refill   acetaminophen (TYLENOL) 500 MG tablet Take 1 tablet (500 mg total) by mouth 3 (three) times daily as needed.     albuterol (VENTOLIN HFA) 108 (90 Base) MCG/ACT inhaler Inhale 2 puffs into the lungs every 6 (six) hours as needed for wheezing or shortness of breath. 8 g 2   Ascorbic Acid (VITAMIN C) 100 MG tablet Take 100 mg by mouth daily.      aspirin EC 81 MG tablet Take 1 tablet (81 mg total) by mouth daily. Swallow whole. 30 tablet 12   BD ULTRA-FINE LANCETS lancets Use as instructed upto 6 times daily 600 each 2   Blood Glucose Monitoring Suppl (ONE TOUCH ULTRA 2) w/Device KIT Use as directed to check sugars twice daily and as needed 1 kit 0   carvedilol (COREG) 6.25 MG tablet Take 1 tablet (6.25 mg total) by mouth 2 (two) times daily with a meal. 90 tablet 2   Cholecalciferol (VITAMIN D3) 25 MCG (1000 UT) CAPS Take 1 capsule (1,000 Units total) by mouth daily. 30 capsule    clindamycin (CLEOCIN T) 1 % external solution Apply topically daily. As needed for acne 30 mL 0   clopidogrel (PLAVIX) 75 MG tablet TAKE ONE TABLET BY MOUTH EVERY EVENING 30 tablet 6   COMFORT EZ PEN NEEDLES 32G X 4 MM MISC Use 4-5x a day 300 each 3   Continuous Blood Gluc Sensor (FREESTYLE LIBRE 3 SENSOR) MISC Place 1 sensor on the skin every 14 days. Use to check glucose continuously 2 each 3   diclofenac Sodium (VOLTAREN) 1 % GEL Apply 2 g topically 3 (three) times daily. 100 g 0   Fluticasone-Umeclidin-Vilant (TRELEGY ELLIPTA) 200-62.5-25 MCG/ACT AEPB Inhale 1 puff into the lungs daily. 28 each 1  furosemide (LASIX) 20 MG tablet Take 1 tablet (20 mg total) by mouth daily. 30 tablet 5   Glucagon, rDNA, (GLUCAGON EMERGENCY) 1 MG KIT INJECT into THE muscle ONCE AS NEEDED FOR emergency 1 kit 12   Insulin Aspart FlexPen (NOVOLOG) 100 UNIT/ML USE UP TO 50 UNITS DAILY in insulin pump (Patient taking differently: 10 - 12 units with each meal) 50 mL 0   losartan (COZAAR) 25 MG tablet Take 1 tablet (25 mg total) by mouth daily. 90 tablet 3   Multiple Vitamin (MULTIVITAMIN ADULT) TABS Take 1 tablet by mouth in the morning and at bedtime.     nitrofurantoin, macrocrystal-monohydrate, (MACROBID) 100 MG capsule Take 1 capsule (100 mg total) by mouth 2 (two) times daily. 14 capsule 0   ONETOUCH ULTRA test strip USE TO check blood glucose FOUR TIMES DAILY 400 strip 2    rosuvastatin (CRESTOR) 20 MG tablet TAKE ONE TABLET BY MOUTH EVERY EVENING 90 tablet 2   SYNTHROID 75 MCG tablet TAKE ONE TABLET BY MOUTH Monday-Saturday BEFORE breakfast 80 tablet 1   topiramate (TOPAMAX) 50 MG tablet Take 1 tablet (50 mg total) by mouth 2 (two) times daily. 60 tablet 12   TRESIBA FLEXTOUCH 100 UNIT/ML FlexTouch Pen Inject 46 Units into the skin daily. (Patient taking differently: Inject 56 Units into the skin daily.) 36 mL 1   No facility-administered medications prior to visit.     Per HPI unless specifically indicated in ROS section below Review of Systems  Objective:  BP 128/66   Pulse 64   Temp 97.8 F (36.6 C) (Temporal)   Ht _0  (1.6 m)   Wt 151 lb (68.5 kg)   SpO2 99%   BMI 26.75 kg/m   Wt Readings from Last 3 Encounters:  05/16/22 151 lb (68.5 kg)  05/10/22 163 lb (73.9 kg)  05/04/22 151 lb (68.5 kg)      Physical Exam Vitals and nursing note reviewed.  Constitutional:      Appearance: Normal appearance. She is not ill-appearing.  HENT:     Mouth/Throat:     Mouth: Mucous membranes are moist.     Pharynx: Oropharynx is clear. No oropharyngeal exudate.  Eyes:     Extraocular Movements: Extraocular movements intact.     Conjunctiva/sclera: Conjunctivae normal.     Pupils: Pupils are equal, round, and reactive to light.  Cardiovascular:     Rate and Rhythm: Normal rate and regular rhythm.     Pulses: Normal pulses.     Heart sounds: Normal heart sounds. No murmur heard. Pulmonary:     Effort: Pulmonary effort is normal. No respiratory distress.     Breath sounds: Normal breath sounds. No wheezing, rhonchi or rales.  Musculoskeletal:        General: Tenderness present.     Right lower leg: No edema.     Left lower leg: No edema.     Comments:  5/5 strength BLE No significant pain at trochanteric bursas bilaterally Discomfort to palpation of lateral mid femur but predominant discomfort at anterior femur   Skin:    General: Skin is warm  and dry.     Findings: No rash.  Neurological:     Mental Status: She is alert.  Psychiatric:        Mood and Affect: Mood normal.        Behavior: Behavior normal.       Results for orders placed or performed during the hospital encounter of 05/10/22  Basic  metabolic panel  Result Value Ref Range   Sodium 134 (L) 135 - 145 mmol/L   Potassium 3.6 3.5 - 5.1 mmol/L   Chloride 107 98 - 111 mmol/L   CO2 18 (L) 22 - 32 mmol/L   Glucose, Bld 503 (HH) 70 - 99 mg/dL   BUN 22 8 - 23 mg/dL   Creatinine, Ser 0.91 0.44 - 1.00 mg/dL   Calcium 9.2 8.9 - 10.3 mg/dL   GFR, Estimated >60 >60 mL/min   Anion gap 9 5 - 15  Urinalysis, Routine w reflex microscopic  Result Value Ref Range   Color, Urine COLORLESS (A) YELLOW   APPearance CLEAR (A) CLEAR   Specific Gravity, Urine 1.014 1.005 - 1.030   pH 6.0 5.0 - 8.0   Glucose, UA >=500 (A) NEGATIVE mg/dL   Hgb urine dipstick NEGATIVE NEGATIVE   Bilirubin Urine NEGATIVE NEGATIVE   Ketones, ur NEGATIVE NEGATIVE mg/dL   Protein, ur NEGATIVE NEGATIVE mg/dL   Nitrite NEGATIVE NEGATIVE   Leukocytes,Ua NEGATIVE NEGATIVE   RBC / HPF 0-5 0 - 5 RBC/hpf   WBC, UA 0-5 0 - 5 WBC/hpf   Bacteria, UA NONE SEEN NONE SEEN   Squamous Epithelial / LPF 0-5 0 - 5  CBC  Result Value Ref Range   WBC 13.3 (H) 4.0 - 10.5 K/uL   RBC 4.97 3.87 - 5.11 MIL/uL   Hemoglobin 13.3 12.0 - 15.0 g/dL   HCT 41.6 36.0 - 46.0 %   MCV 83.7 80.0 - 100.0 fL   MCH 26.8 26.0 - 34.0 pg   MCHC 32.0 30.0 - 36.0 g/dL   RDW 13.7 11.5 - 15.5 %   Platelets 210 150 - 400 K/uL   nRBC 0.0 0.0 - 0.2 %  CBG monitoring, ED  Result Value Ref Range   Glucose-Capillary 515 (HH) 70 - 99 mg/dL   Comment 1 Notify RN   CBG monitoring, ED  Result Value Ref Range   Glucose-Capillary 285 (H) 70 - 99 mg/dL  Troponin I (High Sensitivity)  Result Value Ref Range   Troponin I (High Sensitivity) 4 <18 ng/L   DG Hip Unilat W or Wo Pelvis 2-3 Views Right CLINICAL DATA:  RIGHT hip pain post  fall  EXAM: DG HIP (WITH OR WITHOUT PELVIS) 2-3V RIGHT  COMPARISON:  11/12/2021  FINDINGS: Osseous demineralization.  RIGHT hip prosthesis.  SI joints and LEFT hip joint space preserved.  No acute fracture, dislocation, or bone destruction.  Numerous pelvic phleboliths.  IMPRESSION: RIGHT hip prosthesis.  No acute abnormalities.  Electronically Signed   By: Lavonia Dana M.D.   On: 05/09/2022 15:15  Assessment & Plan:   Problem List Items Addressed This Visit     LADA (latent autoimmune diabetes in adults), managed as type 1 ([place])    Troubleshoot Freestyle Libre 3 new sensor- needed pairing to phone.  Will ask referral coordinator to touch base with endo office about referral.       Fall with injury - Primary    Fall at home suffered ~10d ago, landed on right anterolateral thigh.  Pain persists. Reassuring films done at [facility]. Discussed anticipate bony contusion that should improve over time. Update if worsening or not improving as expected to consider return to ortho given remote h/o R hip replacement. Pt agrees with plan.       Chronic dyspnea    Feels trelegy is helping. Recent high res CT negative for IUP, aslo completed PFTs. Has pulm f/u scheduled for next  month. Appreciate pulm care of patient.       Steroid-induced hyperglycemia    Recent episode of marked hyperglycemia with malaise after low dose decadron injection at [facility], ended up at ER treated with IVF and insulin.  Discussed need to avoid all forms of steroids as much as able given her sensitivity to same.  Will add to intolerance list.       Right thigh pain    Acute thigh pain after fall suffered about 10 days ago, anticipate bony contusion of anterior femur.  Supportive care measures reviewed, update if not improving with treatment.        No orders of the defined types were placed in this encounter.  No orders of the defined types were placed in this encounter.    Patient Instructions  Good  to see you today.  I will ask my referral coordinator to check on appointment with endocrinology.  Keep an eye on the hip - anticipate bony bruise/contusion - this should heal over time. Let me know if not improving as expected.   Follow up plan: Return if symptoms worsen or fail to improve.  [name], MD

## 2022-05-16 NOTE — Patient Instructions (Addendum)
Good to see you today.  I will ask my referral coordinator to check on appointment with endocrinology.  Keep an eye on the hip - anticipate bony bruise/contusion - this should heal over time. Let me know if not improving as expected.

## 2022-05-16 NOTE — Assessment & Plan Note (Signed)
Troubleshoot Freestyle Libre 3 new sensor- needed pairing to phone.  Will ask referral coordinator to touch base with endo office about referral.

## 2022-05-16 NOTE — Assessment & Plan Note (Signed)
Acute thigh pain after fall suffered about 10 days ago, anticipate bony contusion of anterior femur.  Supportive care measures reviewed, update if not improving with treatment.

## 2022-05-16 NOTE — Assessment & Plan Note (Signed)
Fall at home suffered ~10d ago, landed on right anterolateral thigh.  Pain persists. Reassuring films done at [facility]. Discussed anticipate bony contusion that should improve over time. Update if worsening or not improving as expected to consider return to ortho given remote h/o R hip replacement. Pt agrees with plan.

## 2022-05-16 NOTE — Assessment & Plan Note (Addendum)
Recent episode of marked hyperglycemia with malaise after low dose decadron injection at [facility], ended up at ER treated with IVF and insulin.  Discussed need to avoid all forms of steroids as much as able given her sensitivity to same.  Will add to intolerance list.

## 2022-05-16 NOTE — Assessment & Plan Note (Signed)
Feels trelegy is helping. Recent high res CT negative for IUP, aslo completed PFTs. Has pulm f/u scheduled for next month. Appreciate pulm care of patient.

## 2022-05-20 ENCOUNTER — Telehealth: Payer: Self-pay

## 2022-05-20 NOTE — Chronic Care Management (AMB) (Signed)
Chronic Care Management Pharmacy Assistant   Name: Cassandra Benson  MRN: [id number] DOB: June 14, 1941   Reason for Encounter: Reminder Call    Conditions to be addressed/monitored: HTN, HLD, and Cassandra Benson visits:  None in previous 6 months 8/15/23[facility] ED)- hyperglycemia-labs,UA,EKG, IV fluids,Novolog given, no admission. 05/09/22-Cassandra Harris,FNP( [facility])-fall, right side pain, Decadron injection given,xrays,use topical diclofenac and tylenol as needed - no admission  Medications:  Outpatient Encounter Medications as of 05/20/2022  Medication Sig   acetaminophen (TYLENOL) 500 MG tablet Take 1 tablet (500 mg total) by mouth 3 (three) times daily as needed.   albuterol (VENTOLIN HFA) 108 (90 Base) MCG/ACT inhaler Inhale 2 puffs into the lungs every 6 (six) hours as needed for wheezing or shortness of breath.   Ascorbic Acid (VITAMIN C) 100 MG tablet Take 100 mg by mouth daily.   aspirin EC 81 MG tablet Take 1 tablet (81 mg total) by mouth daily. Swallow whole.   BD ULTRA-FINE LANCETS lancets Use as instructed upto 6 times daily   Blood Glucose Monitoring Suppl (ONE TOUCH ULTRA 2) w/Device KIT Use as directed to check sugars twice daily and as needed   carvedilol (COREG) 6.25 MG tablet Take 1 tablet (6.25 mg total) by mouth 2 (two) times daily with a meal.   Cholecalciferol (VITAMIN D3) 25 MCG (1000 UT) CAPS Take 1 capsule (1,000 Units total) by mouth daily.   clindamycin (CLEOCIN T) 1 % external solution Apply topically daily. As needed for acne   clopidogrel (PLAVIX) 75 MG tablet TAKE ONE TABLET BY MOUTH EVERY EVENING   COMFORT EZ PEN NEEDLES 32G X 4 MM MISC Use 4-5x a day   Continuous Blood Gluc Sensor (FREESTYLE LIBRE 3 SENSOR) MISC Place 1 sensor on the skin every 14 days. Use to check glucose continuously   diclofenac Sodium (VOLTAREN) 1 % GEL Apply 2 g topically 3 (three) times daily.   Fluticasone-Umeclidin-Vilant (TRELEGY  ELLIPTA) 200-62.5-25 MCG/ACT AEPB Inhale 1 puff into the lungs daily.   furosemide (LASIX) 20 MG tablet Take 1 tablet (20 mg total) by mouth daily.   Glucagon, rDNA, (GLUCAGON EMERGENCY) 1 MG KIT INJECT into THE muscle ONCE AS NEEDED FOR emergency   Insulin Aspart FlexPen (NOVOLOG) 100 UNIT/ML USE UP TO 50 UNITS DAILY in insulin pump (Patient taking differently: 10 - 12 units with each meal)   losartan (COZAAR) 25 MG tablet Take 1 tablet (25 mg total) by mouth daily.   Multiple Vitamin (MULTIVITAMIN ADULT) TABS Take 1 tablet by mouth in the morning and at bedtime.   nitrofurantoin, macrocrystal-monohydrate, (MACROBID) 100 MG capsule Take 1 capsule (100 mg total) by mouth 2 (two) times daily.   ONETOUCH ULTRA test strip USE TO check blood glucose FOUR TIMES DAILY   rosuvastatin (CRESTOR) 20 MG tablet TAKE ONE TABLET BY MOUTH EVERY EVENING   SYNTHROID 75 MCG tablet TAKE ONE TABLET BY MOUTH Monday-Saturday BEFORE breakfast   topiramate (TOPAMAX) 50 MG tablet Take 1 tablet (50 mg total) by mouth 2 (two) times daily.   TRESIBA FLEXTOUCH 100 UNIT/ML FlexTouch Pen Inject 46 Units into the skin daily. (Patient taking differently: Inject 56 Units into the skin daily.)   No facility-administered encounter medications on file as of 05/20/2022.   Cassandra Benson was contacted to remind of upcoming telephone visit with Cassandra Benson on 05/25/22 at 3:45pm. Patient was reminded to have any blood glucose and blood pressure readings available for review at appointment.  Patient confirmed appointment.  Are you having any problems with your medications? No   Do you have any concerns you like to discuss with the pharmacist? No  CCM referral has been placed prior to visit?  No   Star Rating Drugs: Medication:  Last Fill: Day Supply      [facility] Novolog Losartan 65m            03/15/22             90 Rosuvastatin 261m    04/1622              66 Tresiba                        04/13/22              33   -Coordinate new endocrine referral per pt preference -[facility] vs. [facility] ([place])   LiCharlene BrookeCPP notified  VeAvel SensorCCFlaming [phone]

## 2022-05-25 ENCOUNTER — Ambulatory Visit: Payer: HMO | Admitting: Pharmacist

## 2022-05-25 ENCOUNTER — Ambulatory Visit: Payer: HMO | Admitting: Internal Medicine

## 2022-05-25 DIAGNOSIS — R0609 Other forms of dyspnea: Secondary | ICD-10-CM

## 2022-05-25 DIAGNOSIS — I700 Atherosclerosis of aorta: Secondary | ICD-10-CM

## 2022-05-25 DIAGNOSIS — I10 Essential (primary) hypertension: Secondary | ICD-10-CM

## 2022-05-25 DIAGNOSIS — E139 Other specified diabetes mellitus without complications: Secondary | ICD-10-CM

## 2022-05-25 DIAGNOSIS — E1169 Type 2 diabetes mellitus with other specified complication: Secondary | ICD-10-CM

## 2022-05-25 DIAGNOSIS — I5189 Other ill-defined heart diseases: Secondary | ICD-10-CM

## 2022-05-25 MED ORDER — "INSULIN SYRINGE-NEEDLE U-100 32G X 5/16"" 0.5 ML MISC": 0 refills | Status: DC

## 2022-05-25 MED ORDER — FREESTYLE LIBRE 3 SENSOR MISC: 3 refills | Status: DC

## 2022-05-25 NOTE — Progress Notes (Signed)
Chronic Care Management Pharmacy Note  05/26/2022 Name:  Cassandra Benson MRN:  [id number] DOB:  1940-11-04  Summary: CCM F/U visit -DM very uncontrolled (A1c 11.0 01/2022); Pt has been referred to [facility] and has not been able to schedule appt yet (referral sent 05/05/22, pt last spoke to clinic 2 weeks ago) -Reviewed AGP report: 05/12/22 to 05/25/22. Sensor active: 91%  Time in range (70-180): 10% (goal > 70%)  High (>180): 90%  Low (< 70): 0% (goal < 4%)  GMI: 10.0%; Average glucose: 280  Recommendations/Changes made from today's visit: -Increase Tresiba to 60 units. Take Novolog 20 units with meals. If hypoglycemia occurs go to back 07-11-19 units of Novolog based on meal size/content -Refilled Libre 3 to [facility] -Touch base with referral coordinator re: endocrine referal to [facility]: -Pharmacist follow up televisit scheduled for 3 weeks -PCP F/U 08/05/22    Subjective: Cassandra Benson is an 81 y.o. year old female who is a primary patient of [name], MD.  The CCM team was consulted for assistance with disease management and care coordination needs.    Engaged with patient by telephone for follow up visit in response to provider referral for pharmacy case management and/or care coordination services.   Consent to Services:  The patient was given information about Chronic Care Management services, agreed to services, and gave verbal consent prior to initiation of services.  Please see initial visit note for detailed documentation.   Patient Care Team: [name], MD as PCP - General (Family Medicine) Rockey Situ Kathlene November, MD as PCP - Cardiology (Cardiology) Birder Robson, MD as Referring Physician (Ophthalmology) Charlton Haws, [facility] as Pharmacist (Pharmacist)  Recent office visits: 05/16/22 Dr Danise Mina OV: f/u - advised to avoid steroids d/t recent high BG requiring ED visit. Endo referral still pending.  05/04/22 Dr  Danise Mina OV: f/u LADA. Refer to endocrine. Start aspirin 81 mg.  02/25/22-Javier Gutierrez,MD(PCP)-Dysuria,UA ordered,will start treatment with macrobid 1 wk course  02/14/22-Javier Gutierrez,MD(PCP)-general body aches,consult with Dr.Gherghe about tresiba and HA's,labs ordered,(A1c 11.0); Dexcom ordered,f/u 3 months 02/01/22-Javier Gutierrez,MD(PCP)-f/u-Latest LFTs normal.Recommend daily weights.Limit melatonin to 3-5 mg/night.f/u 3 months  01/04/22-Javier Gutierrez,MD(PCP)-hospital follow up-SOB,referral for pulmonology. 12/13/21-Javier Gutierrez,MD(PCP)- Leg pain and swelling,order D dimer(elevated)(blood counts and kidneys stable) and [place] leg. 11/17/21-Jessica Cody,MD(fam med)-f/u hospital visit,fall,xrays,possible referral for PT,f/u 2 weeks.for pain-- meloxicam 7.5 mg - can take daily for up to 2 weeks,Tylenol 1000 mg up to 3 times a day  Recent consult visits: 04/07/22-Carmen Gonzales,MD  (Pulmonary): consult - SOB. Ordered PFT, CT chest. Trial of Trelegy, Albuterol HFA to see if they help.  03/15/22-Timothy Gollan,MD(cardio)-f/u SOB,EKG, increase carvedilol to 6.25 mg BID 02/08/22-Cristina Gherghe,MD(endo)-f/u LADA,disucss [name] and risk of CHF,Change novolog 10 units with small meal,15 units with regular,18-20 units with larger meal.f/u 3 months 01/31/22-Tina Hackney,FNP(fam med)-CHF,no medication changes f/u 6 months 12/20/21-Tina Hackney,FNP(fam med)-[facility]-no medication changes, f/u 3 months 12/20/21-Cadence Furth,PA(cardio)-hospital f/u,EKG,no medication changes, future labs ordered f/u 2 weeks 11/09/21-Cristina Gherghe,MD(endo)- f/u LADA,refer for diabetes education about sensor,f/u 3 months 11/02/21-Timothy Gollan,MD(cardio)-f/u chest pain -kCTA ordered  Hospital visits: 05/10/22 ED visit [facility]): hyperglycemia. Sugar 600. Labs reassuring. IV fluids and novolog given. Discharged with BG 285.  05/09/22 Urgent care: fall - refilled voltaren gel. F/u with PCP. F/u with ortho.   12/22/21 -  12/24/21 admisssion [facility]): SOB. No etiology found. F/u PCP.   12/16/21 ED visit [facility]):  leg swelling,SOB,labs ,EKG,chest xray,start low dose lasix,49m 1 tablet daily, f/u CHF clinic   11/12/21-[facility]  ED-ankle pain- left AMA    Objective:  Lab Results  Component Value Date   CREATININE 0.91 05/10/2022   BUN 22 05/10/2022   GFR 52.90 (L) 02/14/2022   EGFR 52 (L) 12/27/2021   GFRNONAA >60 05/10/2022   GFRAA >60 03/05/2020   NA 134 (L) 05/10/2022   K 3.6 05/10/2022   CALCIUM 9.2 05/10/2022   CO2 18 (L) 05/10/2022   GLUCOSE 503 (HH) 05/10/2022    Lab Results  Component Value Date/Time   HGBA1C 11.0 (A) 02/08/2022 10:16 AM   HGBA1C 10.3 (A) 11/09/2021 09:57 AM   HGBA1C 8.4 (H) 01/15/2020 05:01 AM   HGBA1C 9.7 (H) 09/10/2019 07:49 AM   GFR 52.90 (L) 02/14/2022 03:18 PM   GFR 54.25 (L) 12/13/2021 04:46 PM   MICROALBUR 0.1 10/23/2014 02:31 PM   MICROALBUR 0.8 07/17/2014 09:49 AM    Last diabetic Eye exam:  Lab Results  Component Value Date/Time   HMDIABEYEEXA No Retinopathy 11/04/2020 12:00 AM    Last diabetic Foot exam:  Lab Results  Component Value Date/Time   HMDIABFOOTEX normal 07/30/2014 12:00 AM     Lab Results  Component Value Date   CHOL 161 10/15/2021   HDL 43.30 10/15/2021   LDLCALC 94 10/15/2021   LDLDIRECT 113.0 09/09/2020   TRIG 118.0 10/15/2021   CHOLHDL 4 10/15/2021       Latest Ref Rng & Units 02/14/2022    3:18 PM 12/16/2021    4:03 PM 10/15/2021    8:50 AM  Hepatic Function  Total Protein 6.0 - 8.3 g/dL 6.4  7.1  6.1   Albumin 3.5 - 5.2 g/dL 3.9  3.8  3.8   AST 0 - 37 U/L '14  19  14   ' ALT 0 - 35 U/L '12  19  11   ' Alk Phosphatase 39 - 117 U/L 77  77  80   Total Bilirubin 0.2 - 1.2 mg/dL 0.3  0.6  0.3     Lab Results  Component Value Date/Time   TSH 1.33 10/15/2021 08:50 AM   TSH 2.16 09/09/2020 07:40 AM   FREET4 0.71 09/09/2020 07:40 AM   FREET4 1.05 11/27/2019 09:23 AM       Latest Ref Rng & Units 05/10/2022   10:00 AM 02/14/2022     3:18 PM 12/24/2021    5:06 AM  CBC  WBC 4.0 - 10.5 K/uL 13.3  7.1  13.8   Hemoglobin 12.0 - 15.0 g/dL 13.3  13.0  12.9   Hematocrit 36.0 - 46.0 % 41.6  39.4  39.1   Platelets 150 - 400 K/uL 210  177.0  210     Lab Results  Component Value Date/Time   VD25OH 26.70 (L) 10/15/2021 08:50 AM   VD25OH 35.41 09/09/2020 07:40 AM    Clinical ASCVD: Yes  The 10-year ASCVD risk score (Arnett DK, et al., 2019) is: 50.4%   Values used to calculate the score:     Age: 80 years     Sex: Female     Is Non-Hispanic African American: No     Diabetic: Yes     Tobacco smoker: No     Systolic Blood Pressure: 409 mmHg     Is BP treated: Yes     HDL Cholesterol: 43.3 mg/dL     Total Cholesterol: 161 mg/dL       09/23/2021   10:48 AM 10/07/2020    8:55 AM 09/09/2020   10:50 AM  Depression screen PHQ 2/9  Decreased  Interest 0 0 0  Down, Depressed, Hopeless 0 0 0  PHQ - 2 Score 0 0 0  Altered sleeping  0 0  Tired, decreased energy  0 0  Change in appetite  0 0  Feeling bad or failure about yourself   0 0  Trouble concentrating  0 0  Moving slowly or fidgety/restless  0 0  Suicidal thoughts  0 0  PHQ-9 Score  0 0  Difficult doing work/chores  Not difficult at all Not difficult at all     Social History   Tobacco Use  Smoking Status Never  Smokeless Tobacco Never   BP Readings from Last 3 Encounters:  05/16/22 128/66  05/10/22 113/77  05/09/22 135/74   Pulse Readings from Last 3 Encounters:  05/16/22 64  05/10/22 66  05/09/22 90   Wt Readings from Last 3 Encounters:  05/16/22 151 lb (68.5 kg)  05/10/22 163 lb (73.9 kg)  05/04/22 151 lb (68.5 kg)   BMI Readings from Last 3 Encounters:  05/16/22 26.75 kg/m  05/10/22 28.87 kg/m  05/04/22 26.75 kg/m    Assessment/Interventions: Review of patient past medical history, allergies, medications, health status, including review of consultants reports, laboratory and other test data, was performed as part of comprehensive  evaluation and provision of chronic care management services.   SDOH:  (Social Determinants of Health) assessments and interventions performed: No - done Dec 2022  SDOH Screenings   Alcohol Screen: Low Risk  (09/23/2021)   Alcohol Screen    Last Alcohol Screening Score (AUDIT): 0  Depression (PHQ2-9): Low Risk  (09/23/2021)   Depression (PHQ2-9)    PHQ-2 Score: 0  Financial Resource Strain: Low Risk  (09/23/2021)   Overall Financial Resource Strain (CARDIA)    Difficulty of Paying Living Expenses: Not hard at all  Food Insecurity: No Food Insecurity (09/23/2021)   Hunger Vital Sign    Worried About Running Out of Food in the Last Year: Never true    Ran Out of Food in the Last Year: Never true  Housing: Low Risk  (09/23/2021)   Housing    Last Housing Risk Score: 0  Physical Activity: Insufficiently Active (09/23/2021)   Exercise Vital Sign    Days of Exercise per Week: 3 days    Minutes of Exercise per Session: 30 min  Social Connections: Moderately Integrated (09/23/2021)   Social Connection and Isolation Panel [NHANES]    Frequency of Communication with Friends and Family: More than three times a week    Frequency of Social Gatherings with Friends and Family: More than three times a week    Attends Religious Services: More than 4 times per year    Active Member of [employer] or Organizations: Yes    Attends Archivist Meetings: More than 4 times per year    Marital Status: Widowed  Stress: No Stress Concern Present (09/23/2021)   [place]    Feeling of Stress : Not at all  Tobacco Use: Low Risk  (05/16/2022)   Patient History    Smoking Tobacco Use: Never    Smokeless Tobacco Use: Never    Passive Exposure: Not on file  Transportation Needs: No Transportation Needs (09/23/2021)   PRAPARE - Transportation    Lack of Transportation (Medical): No    Lack of Transportation (Non-Medical): No    CCM  Care Plan  Allergies  Allergen Reactions   Iodinated Contrast Media Other (See Comments)  Itching (severe) and chest tightness   Penicillins Anaphylaxis, Swelling, Rash and Other (See Comments)    Has patient had a PCN reaction causing immediate rash, facial/tongue/throat swelling, SOB or lightheadedness with hypotension: Yes Has patient had a PCN reaction causing severe rash involving mucus membranes or skin necrosis: yes - remotely Has patient had a PCN reaction that required hospitalization: occurred while hospitalized Has patient had a PCN reaction occurring within the last 10 years: No If all of the above answers are "NO", then may proceed with Cephalosporin use.    Amlodipine Swelling    Pedal edema   Anectine [Succinylcholine] Other (See Comments)    Brother went into cardiac arrest.   Codeine Nausea Only   Gabapentin Other (See Comments)    Gait abnormality   Influenza Vaccines Other (See Comments)    Muscle weakness; unable to walk   Nortriptyline Other (See Comments)    Eye swelling and mouth drawed up   Pamelor [Nortriptyline Hcl] Other (See Comments)    Patient states caused her face to draw in together.   Prednisone     Marked severe hyperglycemia to oral and IM steroids   Valsartan Other (See Comments) and Cough    Allergy to generic only, "Hacking" cough   Zetia [Ezetimibe] Other (See Comments)    Bad muscle cramps   Erythromycin Rash and Swelling   Sulfa Antibiotics Rash    Medications Reviewed Today     Reviewed by Charlton Haws, [facility] (Pharmacist) on 05/25/22 at [place] List Status: <None>   Medication Order Taking? Sig Documenting Provider Last Dose Status Informant  acetaminophen (TYLENOL) 500 MG tablet [id number] Yes Take 1 tablet (500 mg total) by mouth 3 (three) times daily as needed. [name], MD Taking Active Self, Multiple Informants, Pharmacy Records  albuterol (VENTOLIN HFA) 108 (90 Base) MCG/ACT inhaler [id number] Yes Inhale 2  puffs into the lungs every 6 (six) hours as needed for wheezing or shortness of breath. Tyler Pita, MD Taking Active   Ascorbic Acid (VITAMIN C) 100 MG tablet [id number] Yes Take 100 mg by mouth daily. [provider] Taking Active Self, Multiple Informants, Pharmacy Records  aspirin EC 81 MG tablet [id number] Yes Take 1 tablet (81 mg total) by mouth daily. Swallow whole. [name], MD Taking Active   BD ULTRA-FINE LANCETS lancets [id number] Yes Use as instructed upto 6 times daily Phadke, Karsten Ro, MD Taking Active Self, Multiple Informants, Pharmacy Records  Blood Glucose Monitoring Suppl (ONE TOUCH ULTRA 2) w/Device KIT [id number] Yes Use as directed to check sugars twice daily and as needed [name], MD Taking Active   carvedilol (COREG) 6.25 MG tablet [id number] Yes Take 1 tablet (6.25 mg total) by mouth 2 (two) times daily with a meal. Gollan, Kathlene November, MD Taking Active   Cholecalciferol (VITAMIN D3) 25 MCG (1000 UT) CAPS [id number] Yes Take 1 capsule (1,000 Units total) by mouth daily. [name], MD Taking Active   clindamycin (CLEOCIN T) 1 % external solution [id number] Yes Apply topically daily. As needed for acne [name], MD Taking Active   clopidogrel (PLAVIX) 75 MG tablet [id number] Yes TAKE ONE TABLET BY MOUTH EVERY EVENING Angelia Mould, MD Taking Active   COMFORT EZ PEN NEEDLES 32G X 4 MM [place] [id number] Yes Use 4-5x a day Philemon Kingdom, MD Taking Active Multiple Informants, Pharmacy Records, Self  Continuous Blood Gluc Sensor (FREESTYLE LIBRE 3 SENSOR) [place] [id number] Yes Place 1 sensor on the skin every 14 days.  Use to check glucose continuously [name], MD Taking Active   diclofenac Sodium (VOLTAREN) 1 % GEL [id number] Yes Apply 2 g topically 3 (three) times daily. Scot Jun, FNP Taking Active   Fluticasone-Umeclidin-[facility] ELLIPTA) 200-62.5-25 MCG/ACT AEPB [id number] Yes Inhale 1 puff into the lungs  daily. [name], MD Taking Active   furosemide (LASIX) 20 MG tablet [id number] Yes Take 1 tablet (20 mg total) by mouth daily. Minna Merritts, MD Taking Active   Glucagon, rDNA, (GLUCAGON EMERGENCY) 1 MG KIT [id number] Yes INJECT into THE muscle ONCE AS NEEDED FOR emergency Philemon Kingdom, MD Taking Active Multiple Informants, Pharmacy Records, Self  Insulin Aspart FlexPen (NOVOLOG) 100 UNIT/ML [id number] Yes USE UP TO 50 UNITS DAILY in insulin pump  Patient taking differently: 10 - 12 units with each meal   Philemon Kingdom, MD Taking Active Multiple Informants, Pharmacy Records, Self  losartan (COZAAR) 25 MG tablet [id number] Yes Take 1 tablet (25 mg total) by mouth daily. Minna Merritts, MD Taking Active   Multiple Vitamin (MULTIVITAMIN ADULT) TABS [id number] Yes Take 1 tablet by mouth in the morning and at bedtime. [name], MD Taking Active   nitrofurantoin, macrocrystal-monohydrate, (MACROBID) 100 MG capsule [id number] Yes Take 1 capsule (100 mg total) by mouth 2 (two) times daily. [name], MD Taking Active   [facility] ULTRA test strip [id number] Yes USE TO check blood glucose FOUR TIMES DAILY Philemon Kingdom, MD Taking Active Multiple Informants, Pharmacy Records, Self  rosuvastatin (CRESTOR) 20 MG tablet [id number] Yes TAKE ONE TABLET BY MOUTH EVERY Recardo Evangelist, MD Taking Active   SYNTHROID 75 MCG tablet [id number] Yes TAKE ONE TABLET BY MOUTH Monday-Saturday BEFORE breakfast [name], MD Taking Active   topiramate (TOPAMAX) 50 MG tablet [id number] Yes Take 1 tablet (50 mg total) by mouth 2 (two) times daily. Penumalli, Earlean Polka, MD Taking Active Multiple Informants, Pharmacy Records, Self  TRESIBA FLEXTOUCH 100 UNIT/ML FlexTouch Pen [id number] Yes Inject 46 Units into the skin daily.  Patient taking differently: Inject 56 Units into the skin daily.   Philemon Kingdom, MD Taking Active Multiple Informants, Pharmacy Records, Self             Patient Active Problem List   Diagnosis Date Noted   Steroid-induced hyperglycemia 05/16/2022   Right thigh pain 05/16/2022   Dysuria 02/25/2022   Body aches 02/15/2022   Insomnia 02/01/2022   Osteopenia 01/15/2022   Chronic dyspnea 12/22/2021   Unsteadiness 02/13/2021   Atherosclerosis of aorta ([place]) 12/16/2020   Neck pain 12/03/2020   Fall with injury 09/01/2020   Grade II diastolic dysfunction 28/31/5176   Chronic venous insufficiency 04/07/2020   Bowel habit changes 02/18/2020   Partial small bowel obstruction ([place]) 01/15/2020   Epistaxis 01/13/2020   Cognitive changes 10/22/2019   Right sided abdominal pain 10/07/2019   Skin lesion 09/14/2019   Renal insufficiency 09/14/2019   Pain in right buttock 06/18/2019   Chronic midline thoracic back pain 05/14/2019   Sjogren's syndrome without extraglandular involvement ([place]) 02/26/2019   Epigastric discomfort 12/17/2018   PAD (peripheral artery disease) ([place]) 10/04/2018   Raynaud disease 09/04/2018   Amaurosis fugax, both eyes 06/18/2018   TIA (transient ischemic attack) 05/21/2018   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   Facial paresthesia 02/07/2018   Pain and swelling of lower leg 12/25/2017   Fibromyalgia    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), bilateral 08/21/2017   Intractable episodic headache 03/13/2017   Numbness and tingling of both lower extremities 03/13/2017  6th nerve palsy, 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   Fatty liver 02/13/2017   LADA (latent autoimmune diabetes in adults), managed as type 1 ([place]) 10/03/2016   Medicare annual wellness visit, subsequent 06/19/2015   Health maintenance examination 06/19/2015   Advanced care planning/counseling discussion 06/19/2015   Carotid stenosis, 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 06/19/2015   Vitamin D deficiency    Family history of premature CAD 06/02/2015   Chest pain 05/26/2015   Elevated testosterone level in female 09/04/2014   Hyperlipidemia associated with type  2 diabetes mellitus ([place]) 07/30/2014   Essential hypertension 07/17/2014   Hypothyroidism due to Hashimoto's thyroiditis 07/17/2014   Apnea, sleep 08/22/2012   Chronic low back pain 02/27/2012   Positive ANA (antinuclear antibody) 01/06/2012    Immunization History  Administered Date(s) Administered   Pneumococcal Conjugate-13 09/23/2014   Pneumococcal Polysaccharide-23 01/05/2010, 07/17/2013   Tdap 10/07/2011    Conditions to be addressed/monitored:  Hypertension, Hyperlipidemia, Diabetes, Heart Failure, and Coronary Artery Disease  Care Plan : [place]  Updates made by Charlton Haws, [place] since 05/26/2022 12:00 AM     Problem: Hypertension, Hyperlipidemia, Diabetes, Heart Failure, and Coronary Artery Disease      Long-Range Goal: Disease mgmt   Start Date: 01/19/2021  Expected End Date: 04/14/2023  This Visit's Progress: Not on track  Recent Progress: On track  Priority: High  Note:   Current Barriers:  Unable to achieve control of Diabetes  Does not always maintain contact with provider office  Pharmacist Clinical Goal(s):  Patient will achieve improvement in DM as evidenced by CGM readings contact provider office for questions/concerns as evidenced notation of same in electronic health record through collaboration with PharmD and provider.   Interventions: 1:1 collaboration with [name], MD regarding development and update of comprehensive plan of care as evidenced by provider attestation and co-signature Inter-disciplinary care team collaboration (see longitudinal plan of care) Comprehensive medication review performed; medication list updated in electronic medical record  Hypertension / Heart Failure (BP goal <140/90) -Controlled - per clinic readings within goal  -Last ejection fraction: 60-65% (Date: 11/2021) -HF type: Diastolic; NYHA Class: III (marked limitation of activity) -Current treatment: Carvedilol 6.25 mg BID -Appropriate,  Effective, Safe, Accessible Losartan 25 mg daily -Appropriate, Effective, Safe, Accessible Furosemide 20 mg daily -Appropriate, Effective, Safe, Accessible -Medications previously tried: none  -Current home readings: none reported, usually well controlled at home -Current dietary habits: patient reports heart healthy diet -Denies hypotensive/hypertensive symptoms -Educated on BP goals and benefits of medications for prevention of heart attack, stroke and kidney damage; -Counseled to monitor BP at home weekly, document, and provide log at future appointments -Recommended to continue current medication  Hyperlipidemia: (LDL goal < 70) -Not ideally controlled - LDL 94 (09/2021) above goal; pt did not tolerate ezetimibe -Hx TIA, PAD, atherosclerosis -Current treatment: Rosuvastatin 20 mg daily - Appropriate, Query Effective [name] daily - Appropriate, Effective, Safe, Accessible -Medications previously tried: ezetimibe  -Educated on Cholesterol goals;  -Recommended to continue current medication; consider higher dose of rosuvastatin  Diabetes (A1c goal <8%) -Uncontrolled - A1c 11.0% (01/2022) -Follows with Dr Cruzita Lederer for LADA although recently has decided to see a new endocrinologist; came off insulin pump due to inability to get supplies -Very sensitive to steroids - recent steroid injection led to sugar readings > 600 and ED visit. -Pt believes Novolog is causing horrible acne. Discussed uncontrolled DM/sugar can also contribute to acne.  -Current home glucose readings: Freestyle Libre 3 Reviewed AGP report:  05/12/22 to 05/25/22. Sensor active: 91%  Time in range (70-180): 10% (goal > 70%)  High (>180): 90%  Low (< 70): 0% (goal < 4%)  GMI: 10.0%; Average glucose: 280  -Current medications: Tresiba 56 units daily (0.83 u/kg) -Appropriate, Query Effective Novolog 07-11-19 units (based on meal size) -Appropriate, Query Effective Glucagon PRN Freestyle Libre 3 One Touch  supplies -Medications previously tried: insulin pump, metformin,  -Diet: vegetables, greens, fruits, pasta once a month, okra,  -Educated on A1c and blood sugar goals; Continuous glucose monitoring; -Reviewed endocrine referral - pt says she has called [facility] 2 weeks ago and was told they did not have a referral. Will follow up with referral team. -Reviewed AGP - remains very uncontrolled. Pt likely requires more insulin than she is getting now.  -Recommend to increase Tresiba to 60 units daily and increase Novolog to 20 units TID w/ meals.  SOB (Goal: diagnosis) -Not ideally controlled - pt is audibly short of breath on phone and gets progressively worse the longer she talks; she reports some improvement with inhalers -Established w/ pulm 04/07/22. CT chest - mild pulmonary fibrosis. Spirometry - mild airway obstruction -Current treatment  Trelegy 200-62.5-25 mcg/act 1 puff daily - Appropriate, Query Effective Albuterol HFA prn - Appropriate, Effective, Safe, Accessible -Medications previously tried: n/a  -Advised to take Trelegy daily and use albuterol PRN for breakthrough symptoms -Advised to follow up with pulmonology as scheduled  Patient Goals/Self-Care Activities Patient will:  - take medications as prescribed as evidenced by patient report and record review focus on medication adherence by routine check glucose fasting and with meals, document, and provide at future appointments       Medication Assistance: None required.  Patient affirms current coverage meets needs.  Compliance/Adherence/Medication fill history: Care Gaps: Eye exam (due 11/04/21)  Star-Rating Drugs: Rosuvastatin - PDC inaccurate  Medication Access: Within the past 30 days, how often has patient missed a dose of medication? 0 Is a pillbox or other method used to improve adherence? Yes  Factors that may affect medication adherence?  Insurance issues Are meds synced by current pharmacy? Yes  Are  meds delivered by current pharmacy? Yes  Does patient experience delays in picking up medications due to transportation concerns? No   [facility] Reviewed: Is patient disadvantaged to use [facility]?: No  Current Rx insurance plan: HTA Name and location of Current pharmacy:  [facility] - [place], [place] - [place] [address] [facility] [address] [address] [place] [address] Phone: [phone] Fax: [phone]  [facility] services reviewed with patient today?: Yes  - counted pills to determine new medication synchronization plan. Collaborated with pharmacy to implement [place] and Follow Up Patient Decision:  Patient agrees to Care Plan and Follow-up.  Plan: Telephone follow up appointment with care management team member scheduled for:  3 weeks  Charlene Brooke, PharmD, BCACP Clinical Pharmacist [facility] at [facility] [phone]

## 2022-05-26 ENCOUNTER — Telehealth: Payer: Self-pay | Admitting: Pharmacist

## 2022-05-26 NOTE — Patient Instructions (Signed)
Visit Information  Phone number for Pharmacist: [phone]   Goals Addressed   None     Care Plan : [place]  Updates made by Cassandra Benson, RPH since 05/26/2022 12:00 AM     Problem: Hypertension, Hyperlipidemia, Diabetes, Heart Failure, and Coronary Artery Disease      Long-Range Goal: Disease mgmt   Start Date: 01/19/2021  Expected End Date: 04/14/2023  This Visit's Progress: Not on track  Recent Progress: On track  Priority: High  Note:   Current Barriers:  Unable to achieve control of Diabetes  Does not always maintain contact with provider office  Pharmacist Clinical Goal(s):  Patient will achieve improvement in DM as evidenced by CGM readings contact provider office for questions/concerns as evidenced notation of same in electronic health record through collaboration with PharmD and provider.   Interventions: 1:1 collaboration with Cassandra Bush, Cassandra Benson regarding development and update of comprehensive plan of care as evidenced by provider attestation and co-signature Inter-disciplinary care team collaboration (see longitudinal plan of care) Comprehensive medication review performed; medication list updated in electronic medical record  Hypertension / Heart Failure (BP goal <140/90) -Controlled - per clinic readings within goal  -Last ejection fraction: 60-65% (Date: 11/2021) -HF type: Diastolic; NYHA Class: III (marked limitation of activity) -Current treatment: Carvedilol 6.25 mg BID -Appropriate, Effective, Safe, Accessible Losartan 25 mg daily -Appropriate, Effective, Safe, Accessible Furosemide 20 mg daily -Appropriate, Effective, Safe, Accessible -Medications previously tried: none  -Current home readings: none reported, usually well controlled at home -Current dietary habits: patient reports heart healthy diet -Denies hypotensive/hypertensive symptoms -Educated on BP goals and benefits of medications for prevention of heart attack,  stroke and kidney damage; -Counseled to monitor BP at home weekly, document, and provide log at future appointments -Recommended to continue current medication  Hyperlipidemia: (LDL goal < 70) -Not ideally controlled - LDL 94 (09/2021) above goal; pt did not tolerate ezetimibe -Hx TIA, PAD, atherosclerosis -Current treatment: Rosuvastatin 20 mg daily - Appropriate, Query Effective [name] daily - Appropriate, Effective, Safe, Accessible -Medications previously tried: ezetimibe  -Educated on Cholesterol goals;  -Recommended to continue current medication; consider higher dose of rosuvastatin  Diabetes (A1c goal <8%) -Uncontrolled - A1c 11.0% (01/2022) -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 -Pt believes Novolog is causing horrible acne. Discussed uncontrolled DM/sugar can also contribute to acne.  -Current home glucose readings: Freestyle Libre 3 Reviewed AGP report: 05/12/22 to 05/25/22. Sensor active: 91%  Time in range (70-180): 10% (goal > 70%)  High (>180): 90%  Low (< 70): 0% (goal < 4%)  GMI: 10.0%; Average glucose: 280  -Current medications: Tresiba 56 units daily (0.83 u/kg) -Appropriate, Query Effective Novolog 07-11-19 units (based on meal size) -Appropriate, Query Effective Glucagon PRN Freestyle Libre 3 One Touch supplies -Medications previously tried: insulin pump, metformin,  -Diet: vegetables, greens, fruits, pasta once a month, okra,  -Educated on A1c and blood sugar goals; Continuous glucose monitoring; -Reviewed endocrine referral - pt says she has called [place] [facility] 2 weeks ago and was told they did not have a referral. Will follow up with referral team. -Reviewed AGP - remains very uncontrolled. Pt likely requires more insulin than she is getting  now.  -Recommend to increase Tresiba to 60 units daily and increase Novolog to  20 units TID w/ meals.  SOB (Goal: diagnosis) -Not ideally controlled - pt is audibly short of breath on phone and gets progressively worse the longer she talks; she reports some improvement with inhalers -Established w/ pulm 04/07/22. CT chest - mild pulmonary fibrosis. Spirometry - mild airway obstruction -Current treatment  Trelegy 200-62.5-25 mcg/act 1 puff daily - Appropriate, Query Effective Albuterol HFA prn - Appropriate, Effective, Safe, Accessible -Medications previously tried: n/a  -Advised to take Trelegy daily and use albuterol PRN for breakthrough symptoms -Advised to follow up with pulmonology as scheduled  Patient Goals/Self-Care Activities Patient will:  - take medications as prescribed as evidenced by patient report and record review focus on medication adherence by routine check glucose fasting and with meals, document, and provide at future appointments       Patient verbalizes understanding of instructions and care plan provided today and agrees to view in [facility]. Active MyChart status and patient understanding of how to access instructions and care plan via MyChart confirmed with patient.    Telephone follow up appointment with pharmacy team member scheduled for: 3 weeks  Cassandra Benson, PharmD, BCACP Clinical Pharmacist [facility] [phone]

## 2022-05-26 NOTE — Telephone Encounter (Signed)
Discussed DM (LADA) control with patient at length. She has not been able to schedule appt with [facility] yet (Referral sent 8/10, she talked to them 2 weeks ago and was told they did not have anything from [place] to try to schedule her), she probably will not be able to get appt until Oct-Nov at the earliest.   Per Freestyle Libre CGM (see below for report), her sugar is high (above 180) 90% of the time with an average sugar of 280.  She is currently taking [place] 56 units once daily. Novolog dose varies between 10-20 units with meals, she cannot say how she calculates the dose or how many doses she gives per day exactly, typically at least 15 units at least twice a day is her best estimate.  Recommend to start titrating insulin since patient will not be able to see endocrine for a few months. -Increase Tresiba to 60 units daily -Increase Novolog to 20 units 3x daily with meals. If low sugars occur, revert back to 07-11-19 unit sliding scale (that she used questionably before).  Routing to PCP for input.   Reviewed AGP report: 05/12/22 to 05/25/22. Sensor active: 91%             Time in range (70-180): 10% (goal > 70%)             High (>180): 90%             Low (< 70): 0% (goal < 4%)             GMI: 10.0%; Average glucose: 280

## 2022-05-27 ENCOUNTER — Ambulatory Visit (INDEPENDENT_AMBULATORY_CARE_PROVIDER_SITE_OTHER): Payer: HMO | Admitting: Family Medicine

## 2022-05-27 ENCOUNTER — Encounter: Payer: Self-pay | Admitting: Family Medicine

## 2022-05-27 ENCOUNTER — Telehealth: Payer: Self-pay | Admitting: Family Medicine

## 2022-05-27 VITALS — BP 126/72 | HR 101 | Temp 97.6°F | Ht 63.0 in | Wt 148.0 lb

## 2022-05-27 DIAGNOSIS — R519 Headache, unspecified: Secondary | ICD-10-CM | POA: Diagnosis not present

## 2022-05-27 DIAGNOSIS — E139 Other specified diabetes mellitus without complications: Secondary | ICD-10-CM | POA: Diagnosis not present

## 2022-05-27 MED ORDER — BACLOFEN 5 MG PO TABS: 5.0000 mg | ORAL_TABLET | Freq: Two times a day (BID) | ORAL | 0 refills | Status: DC | PRN

## 2022-05-27 NOTE — Telephone Encounter (Signed)
Spoke with pt scheduling OV today at 11:30 with Dr. Darnell Level.

## 2022-05-27 NOTE — Progress Notes (Unsigned)
Patient ID: Cassandra Benson, female    DOB: 09/26/1941, 81 y.o.   MRN: [id number]  This visit was conducted in person.  BP 126/72   Pulse (!) 101   Temp 97.6 F (36.4 C) (Temporal)   Ht _0  (1.6 m)   Wt 148 lb (67.1 kg)   SpO2 99%   BMI 26.22 kg/m    CC: HA Subjective:   HPI: Cassandra Benson is a 81 y.o. female presenting on 05/27/2022 for Headache (C/o severe HA and neck pain. Started 05/25/22. )   5-6d ago started with R occipital pain - intense, both sharp and dull throbbing. Worse with movement, no alleviating factors. Pain starts in R occiput and travels to R temporal region. Currently fels pounding pain to occiput. At its worse 10/10 pain. No photo/phonophobia, nausea. + activity limiting.   No slurred speech, unilateral weakness, vision changes or dizziness/confusion.  Last saw Dr Leta Baptist for migraine 08/2021 - rec topamax 31m BID preventatively, abortive ibuprofen/tylenl and ubrelvy PRN. She does have both these medications at home. Tried leftover topamax 540mwhich didn't really help - she's not been taking this regularly, initially prescribed by Dr PeLeta Baptist2/2022 for migraine headache prevention.   H/o gabapentin and nortriptyline allergy/intolerance.  Denies new medicines, vitamins, supplements or OTC.   Notes fmhx aneurysms but not strong fmhx cerebral aneurysms - sister and mother (abdomen) and father (chest).      Relevant past medical, surgical, family and social history reviewed and updated as indicated. Interim medical history since our last visit reviewed. Allergies and medications reviewed and updated. Outpatient Medications Prior to Visit  Medication Sig Dispense Refill   acetaminophen (TYLENOL) 500 MG tablet Take 1 tablet (500 mg total) by mouth 3 (three) times daily as needed.     albuterol (VENTOLIN HFA) 108 (90 Base) MCG/ACT inhaler Inhale 2 puffs into the lungs every 6 (six) hours as needed for wheezing or shortness of breath. 8 g  2   Ascorbic Acid (VITAMIN C) 100 MG tablet Take 100 mg by mouth daily.     aspirin EC 81 MG tablet Take 1 tablet (81 mg total) by mouth daily. Swallow whole. 30 tablet 12   BD ULTRA-FINE LANCETS lancets Use as instructed upto 6 times daily 600 each 2   Blood Glucose Monitoring Suppl (ONE TOUCH ULTRA 2) w/Device KIT Use as directed to check sugars twice daily and as needed 1 kit 0   carvedilol (COREG) 6.25 MG tablet Take 1 tablet (6.25 mg total) by mouth 2 (two) times daily with a meal. 90 tablet 2   Cholecalciferol (VITAMIN D3) 25 MCG (1000 UT) CAPS Take 1 capsule (1,000 Units total) by mouth daily. 30 capsule    clindamycin (CLEOCIN T) 1 % external solution Apply topically daily. As needed for acne 30 mL 0   clopidogrel (PLAVIX) 75 MG tablet TAKE ONE TABLET BY MOUTH EVERY EVENING 30 tablet 6   COMFORT EZ PEN NEEDLES 32G X 4 MM MISC Use 4-5x a day 300 each 3   Continuous Blood Gluc Sensor (FREESTYLE LIBRE 3 SENSOR) MISC Place 1 sensor on the skin every 14 days. Use to check glucose continuously 2 each 3   diclofenac Sodium (VOLTAREN) 1 % GEL Apply 2 g topically 3 (three) times daily. 100 g 0   Fluticasone-Umeclidin-Vilant (TRELEGY ELLIPTA) 200-62.5-25 MCG/ACT AEPB Inhale 1 puff into the lungs daily. 28 each 1   furosemide (LASIX) 20 MG tablet Take 1 tablet (20 mg total) by  mouth daily. 30 tablet 5   Glucagon, rDNA, (GLUCAGON EMERGENCY) 1 MG KIT INJECT into THE muscle ONCE AS NEEDED FOR emergency 1 kit 12   Insulin Aspart FlexPen (NOVOLOG) 100 UNIT/ML USE UP TO 50 UNITS DAILY in insulin pump (Patient taking differently: 10 - 12 units with each meal) 50 mL 0   Insulin Syringe-Needle U-100 32G X 5/16" 0.5 ML MISC Use as directed 100 each 0   losartan (COZAAR) 25 MG tablet Take 1 tablet (25 mg total) by mouth daily. 90 tablet 3   Multiple Vitamin (MULTIVITAMIN ADULT) TABS Take 1 tablet by mouth in the morning and at bedtime.     nitrofurantoin, macrocrystal-monohydrate, (MACROBID) 100 MG capsule Take  1 capsule (100 mg total) by mouth 2 (two) times daily. 14 capsule 0   ONETOUCH ULTRA test strip USE TO check blood glucose FOUR TIMES DAILY 400 strip 2   rosuvastatin (CRESTOR) 20 MG tablet TAKE ONE TABLET BY MOUTH EVERY EVENING 90 tablet 2   SYNTHROID 75 MCG tablet TAKE ONE TABLET BY MOUTH Monday-Saturday BEFORE breakfast 80 tablet 1   topiramate (TOPAMAX) 50 MG tablet Take 1 tablet (50 mg total) by mouth 2 (two) times daily. 60 tablet 12   TRESIBA FLEXTOUCH 100 UNIT/ML FlexTouch Pen Inject 46 Units into the skin daily. (Patient taking differently: Inject 56 Units into the skin daily.) 36 mL 1   No facility-administered medications prior to visit.     Per HPI unless specifically indicated in ROS section below Review of Systems  Objective:  BP 126/72   Pulse (!) 101   Temp 97.6 F (36.4 C) (Temporal)   Ht _0  (1.6 m)   Wt 148 lb (67.1 kg)   SpO2 99%   BMI 26.22 kg/m   Wt Readings from Last 3 Encounters:  05/27/22 148 lb (67.1 kg)  05/16/22 151 lb (68.5 kg)  05/10/22 163 lb (73.9 kg)      Physical Exam Vitals and nursing note reviewed.  Constitutional:      Appearance: Normal appearance. She is not ill-appearing.  HENT:     Mouth/Throat:     Mouth: Mucous membranes are moist.     Pharynx: Oropharynx is clear. No oropharyngeal exudate.  Eyes:     Extraocular Movements: Extraocular movements intact.     Conjunctiva/sclera: Conjunctivae normal.     Pupils: Pupils are equal, round, and reactive to light.  Neck:     Vascular: No carotid bruit.  Cardiovascular:     Rate and Rhythm: Normal rate and regular rhythm.     Pulses: Normal pulses.     Heart sounds: Normal heart sounds. No murmur heard. Pulmonary:     Effort: Pulmonary effort is normal. No respiratory distress.     Breath sounds: Normal breath sounds. No wheezing, rhonchi or rales.  Musculoskeletal:     Cervical back: Normal range of motion and neck supple. Tenderness present.     Comments: Discomfort to  palpation throughout midline cervical spine and paraspinous mm  Skin:    General: Skin is warm and dry.     Findings: No rash.  Neurological:     General: No focal deficit present.     Mental Status: She is alert.     Cranial Nerves: Cranial nerves 2-12 are intact.     Comments:  CN 2-12 intact FTN intact EOMI  Point tender to palpation to R occipital area, allodynia to light touch, no skin changes appreciated   Psychiatric:  Mood and Affect: Mood normal.        Behavior: Behavior normal.       Lab Results  Component Value Date   HGBA1C 11.0 (A) 02/08/2022    Assessment & Plan:   Problem List Items Addressed This Visit     LADA (latent autoimmune diabetes in adults), managed as type 1 ([place])    She did not feel she got along well with our pharmacist so doesn't want to participate in our CCM program. Agrees with increase in tresiba to 60u and novolog to 20u TID with meals, with option to drop back to her current 10/15/20u SSI if hypoglycemia develops, while she establishes with endocrinology.       Occipital pain - Primary    Longstanding R sided headache present since 2019. Previously thought related to migraines. This head pain seems different and suspicious for occipital neuralgia.  She has intolerance to gabapentin and TCA in the past. Will trial baclofen 5-33m BID PRN and refer to PM&R for consideration of dx/therapeutic occipital block.  Caution with steroid injection in uncontrolled diabetes and heightened steroid sensitivity.       Relevant Medications   baclofen 5 MG TABS   Other Relevant Orders   Ambulatory referral to Physical Medicine Rehab     Meds ordered this encounter  Medications   baclofen 5 MG TABS    Sig: Take 5-10 mg by mouth 2 (two) times daily as needed for muscle spasms (occipital pain).    Dispense:  30 tablet    Refill:  0   Orders Placed This Encounter  Procedures   Ambulatory referral to Physical Medicine Rehab    Referral  Priority:   Urgent    Referral Type:   Rehabilitation    Referral Reason:   Specialty Services Required    Requested Specialty:   Physical Medicine and Rehabilitation    Number of Visits Requested:   1    Patient instructions: Possible occipital neuralgia.  I'd like to refer you to Dr CSharlet Salina office Physical medicine and rehab specialist.  In the meantime, try baclofen muscle relaxant 559m1-2 tablets twice daily as needed (sedation precautions).   -Increase Tresiba to 60 units daily. -Increase Novolog to 20 units 3x daily with meals. If low sugars occur, revert back to 07-11-19 unit sliding scale.   Follow up plan: Return if symptoms worsen or fail to improve.  JaRia BushMD

## 2022-05-27 NOTE — Patient Instructions (Addendum)
Possible occipital neuralgia.  I'd like to refer you to Dr Sharlet Salina' office Physical medicine and rehab specialist.  In the meantime, try baclofen muscle relaxant '5mg'$  1-2 tablets twice daily as needed (sedation precautions).   -Increase Tresiba to 60 units daily. -Increase Novolog to 20 units 3x daily with meals. If low sugars occur, revert back to 07-11-19 unit sliding scale.   Occipital Neuralgia  Occipital neuralgia is a type of headache that causes brief episodes of very bad pain in the back of the head. Pain from occipital neuralgia may spread (radiate) to other parts of the head. These headaches may be caused by irritation of the nerves that leave the spinal cord high up in the neck, just below the base of the skull (occipital nerves). The occipital nerves transmit sensations from the back of the head, the top of the head, and the areas behind the ears. What are the causes? This condition can occur without any known cause (primary headache syndrome). In other cases, this condition is caused by pressure on or irritation of one of the two occipital nerves. Pressure and irritation may be due to: Muscle spasm in the neck. Neck injury. Wear and tear of the vertebrae in the neck (osteoarthritis). Disease of the disks that separate the vertebrae. Swollen blood vessels that put pressure on the occipital nerves. Infections. Tumors. Diabetes. What are the signs or symptoms? This condition causes brief burning, stabbing, electric, shocking, or shooting pain in the back of the head that can radiate to the top of the head. It can happen on one side or both sides of the head. It can also cause: Pain behind the eye. Pain triggered by neck movement or hair brushing. Scalp tenderness. Aching in the back of the head between episodes of very bad pain. Pain that gets worse with exposure to bright lights. How is this diagnosed? Your health care provider may diagnose the condition based on a physical  exam and your symptoms. Tests may be done, such as: Imaging studies of the brain and neck (cervical spine), such as an MRI or CT scan. These look for causes of pinched nerves. Applying pressure to the nerves in the neck to try to re-create the pain. Injection of numbing medicine into the occipital nerve areas to see if pain goes away (diagnostic nerve block). How is this treated? Treatment for this condition may begin with simple measures, such as: Rest. Massage. Applying heat or cold to the area. Over-the-counter pain relievers. If these measures do not work, you may need other treatments, including: Medicines, such as: Prescription-strength anti-inflammatory medicines. Muscle relaxants. Anti-seizure medicines, which can relieve pain. Antidepressants, which can relieve pain. Injected medicines, such as medicines that numb the area (local anesthetic) and steroids. Pulsed radiofrequency ablation. This is when wires are implanted to deliver electrical impulses that block pain signals from the occipital nerve. Surgery to relieve nerve pressure. Physical therapy. Follow these instructions at home: Managing pain     Avoid any activities that cause pain. Rest when you have an attack of pain. Try gentle massage to relieve pain. Try a different pillow or sleeping position. If directed, apply heat to the affected area as often as told by your health care provider. Use the heat source that your health care provider recommends, such as a moist heat pack or a heating pad. Place a towel between your skin and the heat source. Leave the heat on for 20-30 minutes. Remove the heat if your skin turns bright red. This is  especially important if you are unable to feel pain, heat, or cold. You have a greater risk of getting burned. If directed, put ice on the back of your head and neck area. To do this: Put ice in a plastic bag. Place a towel between your skin and the bag. Leave the ice on for 20  minutes, 2-3 times a day. Remove the ice if your skin turns bright red. This is very important. If you cannot feel pain, heat, or cold, you have a greater risk of damage to the area. General instructions Take over-the-counter and prescription medicines only as told by your health care provider. Avoid things that make your symptoms worse, such as bright lights. Try to stay active. Get regular exercise that does not cause pain. Ask your health care provider to suggest safe exercises for you. Work with a physical therapist to learn stretching exercises you can do at home. Practice good posture. Keep all follow-up visits. This is important. Contact a health care provider if: Your medicine is not working. You have new or worsening symptoms. Get help right away if: You have very bad head pain that does not go away. You have a sudden change in vision, balance, or speech. These symptoms may represent a serious problem that is an emergency. Do not wait to see if the symptoms will go away. Get medical help right away. Call your local emergency services (911 in the [place] Do not drive yourself to the hospital. Summary Occipital neuralgia is a type of headache that causes brief episodes of very bad pain in the back of the head. Pain from occipital neuralgia may spread (radiate) to other parts of the head. Treatment for this condition includes rest, massage, and medicines. This information is not intended to replace advice given to you by your health care provider. Make sure you discuss any questions you have with your health care provider. Document Revised: 07/12/2020 Document Reviewed: 07/12/2020 Elsevier Patient Education  [place].

## 2022-05-27 NOTE — Assessment & Plan Note (Signed)
She did not feel she got along well with our pharmacist.  Agrees with increase in tresiba to 60u and novolog to 20u TID with meals, with option to drop back to her current 10/15/20u SSI if hypoglycemia develops.

## 2022-05-27 NOTE — Telephone Encounter (Signed)
Patient called wanting to schedule an appointment with Dr Darnell Level today but he doesn't have anything, She said she has a severe headache and has had for the last 2 days, she said she has a pain in her neck as well. She said she only wanted to see Dr Darnell Level and said since he didn't have anything she is going to go to ER. She asked me to still send message back to let Dr Darnell Level know whats going on

## 2022-05-27 NOTE — Telephone Encounter (Signed)
Agree with these changes. Thank you.

## 2022-05-27 NOTE — Assessment & Plan Note (Addendum)
Longstanding R sided headache present since 2019. Previously thought related to migraines. This head pain seems different and suspicious for occipital neuralgia.  She has intolerance to gabapentin and TCA in the past. Will trial baclofen 5-'10mg'$  BID PRN and refer to PM&R for consideration of dx/therapeutic occipital block.  Caution with steroid injection in uncontrolled diabetes and heightened steroid sensitivity.

## 2022-05-28 ENCOUNTER — Encounter: Payer: Self-pay | Admitting: Family Medicine

## 2022-05-28 NOTE — Telephone Encounter (Signed)
Spoke with patient at OV about recommended changes.

## 2022-05-30 ENCOUNTER — Encounter: Payer: Self-pay | Admitting: Family Medicine

## 2022-06-14 ENCOUNTER — Telehealth: Payer: HMO

## 2022-06-15 ENCOUNTER — Ambulatory Visit: Payer: PPO | Admitting: Pulmonary Disease

## 2022-06-22 ENCOUNTER — Encounter: Payer: Self-pay | Admitting: Family Medicine

## 2022-06-22 ENCOUNTER — Ambulatory Visit (INDEPENDENT_AMBULATORY_CARE_PROVIDER_SITE_OTHER): Payer: HMO | Admitting: Family Medicine

## 2022-06-22 VITALS — BP 144/62 | HR 79 | Temp 97.4°F | Ht 63.0 in | Wt 148.5 lb

## 2022-06-22 DIAGNOSIS — R739 Hyperglycemia, unspecified: Secondary | ICD-10-CM | POA: Diagnosis not present

## 2022-06-22 DIAGNOSIS — T380X5A Adverse effect of glucocorticoids and synthetic analogues, initial encounter: Secondary | ICD-10-CM

## 2022-06-22 DIAGNOSIS — E139 Other specified diabetes mellitus without complications: Secondary | ICD-10-CM

## 2022-06-22 DIAGNOSIS — M25511 Pain in right shoulder: Secondary | ICD-10-CM

## 2022-06-22 DIAGNOSIS — R519 Headache, unspecified: Secondary | ICD-10-CM

## 2022-06-22 DIAGNOSIS — G8929 Other chronic pain: Secondary | ICD-10-CM | POA: Diagnosis not present

## 2022-06-22 LAB — POCT GLYCOSYLATED HEMOGLOBIN (HGB A1C): Hemoglobin A1C: 8.8 % — AB (ref 4.0–5.6)

## 2022-06-22 MED ORDER — FREESTYLE LIBRE READER DEVI: 0 refills | Status: DC

## 2022-06-22 MED ORDER — DICLOFENAC SODIUM 1 % EX GEL: 2.0000 g | Freq: Three times a day (TID) | CUTANEOUS | 1 refills | Status: DC

## 2022-06-22 NOTE — Patient Instructions (Addendum)
Possible shoulder bursitis  Continue tylenol '1000mg'$  up to 3 times a day as needed.  May use heating pad to the shoulder, rub diclofenac/voltaren cream to shoulder.  Do exercises provided.  Good to see you today Enjoy your upcoming 81th birthday!  Sugar is better - continue working on low sugar/low carb diabetic diet and active lifestyle as tolerated.

## 2022-06-22 NOTE — Assessment & Plan Note (Signed)
This is consistent with her chronic R headaches. She sees neurology and has f/u planned 07/2022.

## 2022-06-22 NOTE — Assessment & Plan Note (Addendum)
In between endocrinologists.  Will check with [facility] clinic to see if they will be able to see her. If not, will need new referral placed - she would be willing to go to [place] or even /[place] area.  She feels coming off insulin aspart has helped her feel better. A1c is improved but still above goal. She is only on tresiba 56u daily.  She remains frustrated as her sugars remain high despite always following a healthy diet.

## 2022-06-22 NOTE — Progress Notes (Signed)
Patient ID: Cassandra Benson, female    DOB: [birth year], 81 y.o.   MRN: [id number]  This visit was conducted in person.  BP (!) 144/62 (BP Location: Right Arm, Cuff Size: Normal)   Pulse 79   Temp (!) 97.4 F (36.3 C) (Temporal)   Ht '5\' 3"'  (1.6 m)   Wt 148 lb 8 oz (67.4 kg)   SpO2 99%   BMI 26.31 kg/m    CC: R shoulder pain  Subjective:   HPI: Cassandra Benson is a 81 y.o. female presenting on 06/22/2022 for Shoulder Pain (C/o R shoulder pain, SOB and HA.  Started 06/18/22. )   4 d h/o R shoulder pain associated with headache and shortness of breath. Denies inciting trauma/injury or falls. Points to posterior shoulder with radiation to anterior shoulder and chest. Pain is affecting usage of R arm. Occasional shooting pain down right arm as well as numbness/paresthesias. Treating pain with tylenol 101m 1-2 times daily.   Notes worsening facial acne. She will call to schedule appointment with dermatologist and let me know if referral needed.   HA starts at R temporal region with radiation to R parieto-occipital scalp. Pain starts as electrical shock, can be sharp, dull throbbing, and burning. Similar to prior chronic headaches. She has neurology follow up 07/2022 ([place]).   DM - in between endocrinologists. She stopped insulin aspart and since stopping has felt markedly better - feels sugar control has improved, as well as mental confusion which may have been attributable to aspart insulin. Fasting cbg this morning 81. She brings Libre 3 data which was reviewed:  Lab Results  Component Value Date   HGBA1C 8.8 (A) 06/22/2022        Relevant past medical, surgical, family and social history reviewed and updated as indicated. Interim medical history since our last visit reviewed. Allergies and medications reviewed and updated. Outpatient Medications Prior to Visit  Medication Sig Dispense Refill   acetaminophen (TYLENOL) 500 MG tablet Take 1 tablet (500 mg total)  by mouth 3 (three) times daily as needed.     albuterol (VENTOLIN HFA) 108 (90 Base) MCG/ACT inhaler Inhale 2 puffs into the lungs every 6 (six) hours as needed for wheezing or shortness of breath. 8 g 2   Ascorbic Acid (VITAMIN C) 100 MG tablet Take 100 mg by mouth daily.     aspirin EC 81 MG tablet Take 1 tablet (81 mg total) by mouth daily. Swallow whole. 30 tablet 12   baclofen 5 MG TABS Take 5-10 mg by mouth 2 (two) times daily as needed for muscle spasms (occipital pain). 30 tablet 0   BD ULTRA-FINE LANCETS lancets Use as instructed upto 6 times daily 600 each 2   Blood Glucose Monitoring Suppl (ONE TOUCH ULTRA 2) w/Device KIT Use as directed to check sugars twice daily and as needed 1 kit 0   carvedilol (COREG) 6.25 MG tablet Take 1 tablet (6.25 mg total) by mouth 2 (two) times daily with a meal. 90 tablet 2   Cholecalciferol (VITAMIN D3) 25 MCG (1000 UT) CAPS Take 1 capsule (1,000 Units total) by mouth daily. 30 capsule    clindamycin (CLEOCIN T) 1 % external solution Apply topically daily. As needed for acne 30 mL 0   clopidogrel (PLAVIX) 75 MG tablet TAKE ONE TABLET BY MOUTH EVERY EVENING 30 tablet 6   COMFORT EZ PEN NEEDLES 32G X 4 MM MISC Use 4-5x a day 300 each 3   Continuous Blood Gluc  Sensor (FREESTYLE LIBRE 3 SENSOR) MISC Place 1 sensor on the skin every 14 days. Use to check glucose continuously 2 each 3   Fluticasone-Umeclidin-Vilant (TRELEGY ELLIPTA) 200-62.5-25 MCG/ACT AEPB Inhale 1 puff into the lungs daily. 28 each 1   furosemide (LASIX) 20 MG tablet Take 1 tablet (20 mg total) by mouth daily. 30 tablet 5   Glucagon, rDNA, (GLUCAGON EMERGENCY) 1 MG KIT INJECT into THE muscle ONCE AS NEEDED FOR emergency 1 kit 12   Insulin Aspart FlexPen (NOVOLOG) 100 UNIT/ML USE UP TO 50 UNITS DAILY in insulin pump (Patient taking differently: 10 - 12 units with each meal) 50 mL 0   Insulin Syringe-Needle U-100 32G X 5/16" 0.5 ML MISC Use as directed 100 each 0   losartan (COZAAR) 25 MG tablet  Take 1 tablet (25 mg total) by mouth daily. 90 tablet 3   Multiple Vitamin (MULTIVITAMIN ADULT) TABS Take 1 tablet by mouth in the morning and at bedtime.     nitrofurantoin, macrocrystal-monohydrate, (MACROBID) 100 MG capsule Take 1 capsule (100 mg total) by mouth 2 (two) times daily. 14 capsule 0   ONETOUCH ULTRA test strip USE TO check blood glucose FOUR TIMES DAILY 400 strip 2   rosuvastatin (CRESTOR) 20 MG tablet TAKE ONE TABLET BY MOUTH EVERY EVENING 90 tablet 2   SYNTHROID 75 MCG tablet TAKE ONE TABLET BY MOUTH Monday-Saturday BEFORE breakfast 80 tablet 1   topiramate (TOPAMAX) 50 MG tablet Take 1 tablet (50 mg total) by mouth 2 (two) times daily. 60 tablet 12   TRESIBA FLEXTOUCH 100 UNIT/ML FlexTouch Pen Inject 46 Units into the skin daily. (Patient taking differently: Inject 56 Units into the skin daily.) 36 mL 1   diclofenac Sodium (VOLTAREN) 1 % GEL Apply 2 g topically 3 (three) times daily. 100 g 0   No facility-administered medications prior to visit.     Per HPI unless specifically indicated in ROS section below Review of Systems  Objective:  BP (!) 144/62 (BP Location: Right Arm, Cuff Size: Normal)   Pulse 79   Temp (!) 97.4 F (36.3 C) (Temporal)   Ht '5\' 3"'  (1.6 m)   Wt 148 lb 8 oz (67.4 kg)   SpO2 99%   BMI 26.31 kg/m   Wt Readings from Last 3 Encounters:  06/22/22 148 lb 8 oz (67.4 kg)  05/27/22 148 lb (67.1 kg)  05/16/22 151 lb (68.5 kg)      Physical Exam Vitals and nursing note reviewed.  Constitutional:      Appearance: Normal appearance. She is not ill-appearing.  Eyes:     Extraocular Movements: Extraocular movements intact.     Pupils: Pupils are equal, round, and reactive to light.  Cardiovascular:     Rate and Rhythm: Normal rate and regular rhythm.     Pulses: Normal pulses.     Heart sounds: Normal heart sounds. No murmur heard. Pulmonary:     Effort: Pulmonary effort is normal. No respiratory distress.     Breath sounds: Normal breath sounds.  No wheezing, rhonchi or rales.  Musculoskeletal:     Right lower leg: No edema.     Left lower leg: No edema.     Comments:  L shoulder WNL R shoulder exam:  No deformity of shoulders on inspection. Discomfort with palpation of posterior shoulder at bursa. Limited ROM in abduction and forward flexion past 90 degrees. Discomfort with testing SITS in ext/int rotation. Discomfort with empty can sign. Discomfort with Speed test. No impingement.  No significant pain with rotation of humeral head in Page joint.   Skin:    General: Skin is warm and dry.     Findings: No rash.  Neurological:     Mental Status: She is alert.  Psychiatric:        Mood and Affect: Mood normal.        Behavior: Behavior normal.       Results for orders placed or performed in visit on 06/22/22  POCT glycosylated hemoglobin (Hb A1C)  Result Value Ref Range   Hemoglobin A1C 8.8 (A) 4.0 - 5.6 %   HbA1c POC (<> result, manual entry)     HbA1c, POC (prediabetic range)     HbA1c, POC (controlled diabetic range)      Assessment & Plan:   Problem List Items Addressed This Visit     Acute pain of right shoulder - Primary    Story/exam suspicious for shoulder bursitis, r/o RTC /biceps tendinopathy. Would avoid steroid injection in h/o extreme sensitivity to steroid induced hyperglycemia in the past. Supportive measures reviewed - rec heating pad, gentle stretching, provided with exercise handout from [facility] pt advisor, tylenol and topical voltaren. Update if not improving with treatment.       LADA (latent autoimmune diabetes in adults), managed as type 1 ([place])    In between endocrinologists.  Will check with [facility] clinic to see if they will be able to see her. If not, will need new referral placed - she would be willing to go to [place] or even [place] area.  She feels coming off insulin aspart has helped her feel better. A1c is improved but still above goal. She is only on tresiba 56u daily.  She  remains frustrated as her sugars remain high despite always following a healthy diet.       Relevant Orders   POCT glycosylated hemoglobin (Hb A1C) (Completed)   Chronic right-sided headache    This is consistent with her chronic R headaches. She sees neurology and has f/u planned 07/2022.       Steroid-induced hyperglycemia     Meds ordered this encounter  Medications   Continuous Blood Gluc Receiver (FREESTYLE LIBRE READER) DEVI    Sig: Use as directed for use with Freestyle Libre 3    Dispense:  1 each    Refill:  0   diclofenac Sodium (VOLTAREN) 1 % GEL    Sig: Apply 2 g topically 3 (three) times daily.    Dispense:  100 g    Refill:  1   Orders Placed This Encounter  Procedures   POCT glycosylated hemoglobin (Hb A1C)     Patient Instructions  Possible shoulder bursitis  Continue tylenol 1071m up to 3 times a day as needed.  May use heating pad to the shoulder, rub diclofenac/voltaren cream to shoulder.  Do exercises provided.  Good to see you today Enjoy your upcoming 81th birthday!  Sugar is better - continue working on low sugar/low carb diabetic diet and active lifestyle as tolerated.   Follow up plan: Return if symptoms worsen or fail to improve.  JRia Bush MD

## 2022-06-22 NOTE — Assessment & Plan Note (Signed)
Story/exam suspicious for shoulder bursitis, r/o RTC /biceps tendinopathy. Would avoid steroid injection in h/o extreme sensitivity to steroid induced hyperglycemia in the past. Supportive measures reviewed - rec heating pad, gentle stretching, provided with exercise handout from [facility] pt advisor, tylenol and topical voltaren. Update if not improving with treatment.

## 2022-06-27 ENCOUNTER — Encounter: Payer: Self-pay | Admitting: *Deleted

## 2022-07-13 ENCOUNTER — Encounter: Payer: Self-pay | Admitting: Family Medicine

## 2022-07-20 ENCOUNTER — Other Ambulatory Visit: Payer: Self-pay | Admitting: Family Medicine

## 2022-07-20 DIAGNOSIS — E785 Hyperlipidemia, unspecified: Secondary | ICD-10-CM

## 2022-07-21 ENCOUNTER — Telehealth: Payer: Self-pay | Admitting: Family Medicine

## 2022-07-21 NOTE — Telephone Encounter (Signed)
[facility] called to get verbal orders to change the quantity that was written for medication. It was for 1 pen 37m with 3 refills. If they fill for that amount it will only be 22 days for her when she's only doing 6 units a day.

## 2022-07-25 NOTE — Telephone Encounter (Signed)
Spoke with [facility] relaying Dr. Chrissie Noa message Cruzita Lederer, Salena Saner, MD  You 3 days ago    That would be fine.  Sincerely,  Philemon Kingdom MD   ).  They verbalize understanding and says they have already filled rx for pt.  I notified them, Dr. Cruzita Lederer (endo) usually prescribes this medication and future refill requests should go to her.  Again, verbalizes understanding and will document in pt's account.

## 2022-08-01 ENCOUNTER — Ambulatory Visit: Payer: HMO | Admitting: Pulmonary Disease

## 2022-08-01 ENCOUNTER — Encounter: Payer: Self-pay | Admitting: Pulmonary Disease

## 2022-08-01 ENCOUNTER — Other Ambulatory Visit
Admission: RE | Admit: 2022-08-01 | Discharge: 2022-08-01 | Disposition: A | Discharge status: Home / Self Care | Payer: HMO | Source: Ambulatory Visit | Attending: Pulmonary Disease | Admitting: Pulmonary Disease

## 2022-08-01 VITALS — BP 110/60 | HR 82 | Temp 98.0°F | Ht 63.0 in | Wt 150.2 lb

## 2022-08-01 DIAGNOSIS — R0602 Shortness of breath: Secondary | ICD-10-CM

## 2022-08-01 DIAGNOSIS — M329 Systemic lupus erythematosus, unspecified: Secondary | ICD-10-CM | POA: Insufficient documentation

## 2022-08-01 DIAGNOSIS — J4530 Mild persistent asthma, uncomplicated: Secondary | ICD-10-CM | POA: Diagnosis not present

## 2022-08-01 DIAGNOSIS — G478 Other sleep disorders: Secondary | ICD-10-CM | POA: Diagnosis not present

## 2022-08-01 DIAGNOSIS — R499 Unspecified voice and resonance disorder: Secondary | ICD-10-CM

## 2022-08-01 DIAGNOSIS — R768 Other specified abnormal immunological findings in serum: Secondary | ICD-10-CM

## 2022-08-01 LAB — SEDIMENTATION RATE: Sed Rate: 33 mm/hr — ABNORMAL HIGH (ref 0–30)

## 2022-08-01 LAB — NITRIC OXIDE: Nitric Oxide: 31

## 2022-08-01 LAB — C-REACTIVE PROTEIN: CRP: 0.6 mg/dL (ref <1.0)

## 2022-08-01 MED ORDER — TRELEGY ELLIPTA 200-62.5-25 MCG/ACT IN AEPB: 1.0000 | INHALATION_SPRAY | Freq: Every day | RESPIRATORY_TRACT | 0 refills | Status: DC

## 2022-08-01 MED ORDER — ALBUTEROL SULFATE HFA 108 (90 BASE) MCG/ACT IN AERS: 2.0000 | INHALATION_SPRAY | Freq: Four times a day (QID) | RESPIRATORY_TRACT | 2 refills | Status: DC | PRN

## 2022-08-01 NOTE — Patient Instructions (Signed)
We have ordered the blood work again.  We will have a chest x-ray.  Have ordered a home sleep study.  We sent the prescription to [facility] for a rescue inhaler.  We will see you in follow-up in 6 to 8 weeks time call sooner should any new problems arise.

## 2022-08-01 NOTE — Progress Notes (Signed)
 Subjective:    Patient ID: Cassandra Benson, female    DOB: 06/23/1941, 81 y.o.   MRN: [id number] Patient Care Team: Gutierrez, Javier, MD as PCP - General (Family Medicine) Gollan, Timothy J, MD as PCP - Cardiology (Cardiology) Porfilio, William, MD as Referring Physician (Ophthalmology) Foltanski, Lindsey N, RPH as Pharmacist (Pharmacist)  Chief Complaint  Patient presents with   Follow-up    Constant SOB. Occasional wheezing. Dry cough.    HPI The patient is a 81-year-old lifelong never smoker with a very complex medical history as noted below, who presents for follow-up on the issue of dyspnea of approximately 8 months duration.  She was initially evaluated here on 07 April 2022, for the details of that visit please refer to that note.  She states that the issues were not triggered by any illness preceding the symptoms he just started noticing that without rhyme or reason her "breath would give out".  Previously she had noted a varying tone to her voice with her voice at times going from high pitch to aphonic.  He was started on Trelegy after her initial visit here and she has noted that this has resolved somewhat. She gives out easily due to fatigue and shortness of breath while doing activities of daily living or while walking.  She notes that her grandchildren have to help her make her bed due to dyspnea.  She used to go on daily walks with friends but now avoids this due to her breathlessness and easy fatigability.  She has not had nocturnal awakenings with the shortness of breath.  No orthopnea or paroxysmal nocturnal dyspnea.  She does notice tachypalpitations.  No diaphoresis during these episodes.  She has not had any cough of note.  No hemoptysis.  Has occasional dysphagia mostly to solids but no gastroesophageal reflux symptoms.  No fevers, chills or sweats.  She has significant issues with arthralgias and myalgias, no joint swelling per se.  Nothing helps her symptoms.  Any  activity aggravates symptoms.  Since starting on Trelegy her dyspnea has not improved significantly.  Have asthma component on her PFTs.  At her initial visit here we requested reevaluation of connective tissue titers, however, she did not get these drawn.  She misunderstood the instructions for the lab.   She has taken nitrofurantoin in the past, however, not for a prolonged period of time and currently is not on this medication.  History of positive ANA and Sjogren's.  She has a history of autoimmune diabetes. History of Hashimoto's.  Has Raynaud's phenomena without gangrene.  At her initial visit working diagnosis was asthma.  Could not exclude potential "shrinking lung syndrome" associated with lupus/connective tissue disease.  She had high-resolution chest CT and PFTs that are delineated below. Her dyspnea is out of proportion to the findings on CT and on PFTs.  At her initial visit she was provided with Trelegy Ellipta and albuterol prescriptions.  She never got her albuterol filled.  She notes that Trelegy helps her but then towards the end of the day starts "wearing off".  She has nonrestorative sleep.  States that she has had a sleep study in the past and was told it was positive however never got therapy for the same.  States that when she has family visiting they do note that she snores loudly.  DATA 12/24/2021 echocardiogram: LVEF 60 to 65%, grade 1 DD, normal right ventricular systolic function.  Normal pulmonary artery systolic pressure.  Mild MR. 04/11/2022 CT chest high-resolution:   Mild pulmonary fibrosis pattern with apical to basal gradient, no significant air trapping on expiratory phase.  Not consistent with UIP.  Findings are very mild. 04/22/2022 PFTs: FEV1 1.91 L or 101% predicted, FVC 2.98 L or 117% predicted, FEV1/FVC 64%.  No bronchodilator response, lung volumes normal.  Mild diffusion capacity impairment.  Consistent with mild obstructive airways disease.  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  Review of Systems A 10 point review of systems was performed and it is as noted above otherwise negative.  Patient Active Problem List   Diagnosis Date Noted   Steroid-induced hyperglycemia 05/16/2022   Right thigh pain 05/16/2022   Dysuria 02/25/2022   Body aches 02/15/2022   Insomnia 02/01/2022   Osteopenia 01/15/2022   Chronic dyspnea 12/22/2021   Unsteadiness 02/13/2021   Atherosclerosis of aorta (HCC) 12/16/2020   Neck pain 12/03/2020   Fall with injury 09/01/2020   Grade II diastolic dysfunction 08/01/2020   Chronic venous insufficiency 04/07/2020   Bowel habit changes 02/18/2020   Partial small bowel obstruction (HCC) 01/15/2020   Epistaxis 01/13/2020   Cognitive changes 10/22/2019   Right sided abdominal pain 10/07/2019   Skin lesion 09/14/2019   Renal insufficiency 09/14/2019   Pain in right buttock 06/18/2019   Chronic midline thoracic back pain 05/14/2019   Sjogren's syndrome without extraglandular involvement (HCC) 02/26/2019   Epigastric discomfort 12/17/2018   PAD (peripheral artery disease) (HCC) 10/04/2018   Raynaud disease 09/04/2018   Amaurosis fugax, both eyes 06/18/2018   TIA (transient ischemic attack) 05/21/2018   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   Facial paresthesia 02/07/2018   Pain and swelling of lower leg 12/25/2017   Fibromyalgia    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), bilateral 08/21/2017   Chronic right-sided headache 03/13/2017   Numbness and tingling of both lower extremities 03/13/2017   6th nerve palsy, 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   Fatty liver 02/13/2017   LADA (latent autoimmune diabetes in adults), 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/2016   Acute pain of right shoulder 08/15/2016   Medicare annual wellness visit, subsequent 06/19/2015   Health maintenance examination 06/19/2015   Advanced care planning/counseling discussion 06/19/2015   Carotid stenosis, 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 06/19/2015    Vitamin D deficiency    Family history of premature CAD 06/02/2015   Chest pain 05/26/2015   Elevated testosterone level in female 09/04/2014   Hyperlipidemia associated with type 2 diabetes mellitus (HCC) 07/30/2014   Essential hypertension 07/17/2014   Hypothyroidism due to Hashimoto's thyroiditis 07/17/2014   Apnea, sleep 08/22/2012   Chronic low back pain 02/27/2012   Positive ANA (antinuclear antibody) 01/06/2012   Social History   Tobacco Use   Smoking status: Never   Smokeless tobacco: Never  Substance Use Topics   Alcohol use: No   Allergies  Allergen Reactions   Iodinated Contrast Media Other (See Comments)    Itching (severe) and chest tightness   Penicillins Anaphylaxis, Swelling, Rash and Other (See Comments)    Has patient had a PCN reaction causing immediate rash, facial/tongue/throat swelling, SOB or lightheadedness with hypotension: Yes Has patient had a PCN reaction causing severe rash involving mucus membranes or skin necrosis: yes - remotely Has patient had a PCN reaction that required hospitalization: occurred while hospitalized Has patient had a PCN reaction occurring within the last 10 years: No If all of the above answers are "NO", then may proceed with Cephalosporin use.    Amlodipine Swelling    Pedal edema   Anectine [Succinylcholine] Other (See   Comments)    Brother went into cardiac arrest.   Codeine Nausea Only   Gabapentin Other (See Comments)    Gait abnormality   Influenza Vaccines Other (See Comments)    Muscle weakness; unable to walk   Nortriptyline Other (See Comments)    Eye swelling and mouth drawed up   Pamelor [Nortriptyline Hcl] Other (See Comments)    Patient states caused her face to draw in together.   Prednisone     Marked severe hyperglycemia to oral and IM steroids   Valsartan Other (See Comments) and Cough    Allergy to generic only, "Hacking" cough   Zetia [Ezetimibe] Other (See Comments)    Bad muscle cramps    Erythromycin Rash and Swelling   Sulfa Antibiotics Rash   Current Meds  Medication Sig   acetaminophen (TYLENOL) 500 MG tablet Take 1 tablet (500 mg total) by mouth 3 (three) times daily as needed.   albuterol (VENTOLIN HFA) 108 (90 Base) MCG/ACT inhaler Inhale 2 puffs into the lungs every 6 (six) hours as needed.   Ascorbic Acid (VITAMIN C) 100 MG tablet Take 100 mg by mouth daily.   aspirin EC 81 MG tablet Take 1 tablet (81 mg total) by mouth daily. Swallow whole.   baclofen 5 MG TABS Take 5-10 mg by mouth 2 (two) times daily as needed for muscle spasms (occipital pain).   BD ULTRA-FINE LANCETS lancets Use as instructed upto 6 times daily   Blood Glucose Monitoring Suppl (ONE TOUCH ULTRA 2) w/Device KIT Use as directed to check sugars twice daily and as needed   carvedilol (COREG) 6.25 MG tablet Take 1 tablet (6.25 mg total) by mouth 2 (two) times daily with a meal.   Cholecalciferol (VITAMIN D3) 25 MCG (1000 UT) CAPS Take 1 capsule (1,000 Units total) by mouth daily.   clindamycin (CLEOCIN T) 1 % external solution Apply topically daily. As needed for acne   clopidogrel (PLAVIX) 75 MG tablet TAKE ONE TABLET BY MOUTH EVERY EVENING   COMFORT EZ PEN NEEDLES 32G X 4 MM MISC Use 4-5x a day   Continuous Blood Gluc Receiver (FREESTYLE LIBRE READER) DEVI Use as directed for use with Freestyle Libre 3   Continuous Blood Gluc Sensor (FREESTYLE LIBRE 3 SENSOR) MISC Place 1 sensor on the skin every 14 days. Use to check glucose continuously   diclofenac Sodium (VOLTAREN) 1 % GEL Apply 2 g topically 3 (three) times daily.   Fluticasone-Umeclidin-Vilant (TRELEGY ELLIPTA) 200-62.5-25 MCG/ACT AEPB Inhale 1 puff into the lungs daily.   Fluticasone-Umeclidin-Vilant (TRELEGY ELLIPTA) 200-62.5-25 MCG/ACT AEPB Inhale 1 puff into the lungs daily.   furosemide (LASIX) 20 MG tablet Take 1 tablet (20 mg total) by mouth daily.   Glucagon, rDNA, (GLUCAGON EMERGENCY) 1 MG KIT INJECT into THE muscle ONCE AS NEEDED FOR  emergency   insulin degludec (TRESIBA FLEXTOUCH) 100 UNIT/ML FlexTouch Pen Inject 56 Units into the skin daily.   Insulin Syringe-Needle U-100 32G X 5/16" 0.5 ML MISC Use as directed   losartan (COZAAR) 25 MG tablet Take 1 tablet (25 mg total) by mouth daily.   Multiple Vitamin (MULTIVITAMIN ADULT) TABS Take 1 tablet by mouth in the morning and at bedtime.   ONETOUCH ULTRA test strip USE TO check blood glucose FOUR TIMES DAILY   rosuvastatin (CRESTOR) 20 MG tablet TAKE ONE TABLET BY MOUTH EVERY EVENING   SYNTHROID 75 MCG tablet TAKE ONE TABLET BY MOUTH Monday-Saturday BEFORE breakfast   topiramate (TOPAMAX) 50 MG tablet Take 1   tablet (50 mg total) by mouth 2 (two) times daily.   [DISCONTINUED] albuterol (VENTOLIN HFA) 108 (90 Base) MCG/ACT inhaler Inhale 2 puffs into the lungs every 6 (six) hours as needed for wheezing or shortness of breath.   Immunization History  Administered Date(s) Administered   Pneumococcal Conjugate-13 09/23/2014   Pneumococcal Polysaccharide-23 01/05/2010, 07/17/2013   Tdap 10/07/2011       Objective:   Physical Exam BP 110/60 (BP Location: Left Arm, Cuff Size: Normal)   Pulse 82   Temp 98 F (36.7 C)   Ht 5' 3" (1.6 m)   Wt 150 lb 3.2 oz (68.1 kg)   SpO2 98%   BMI 26.61 kg/m  GENERAL: Well-developed, well-nourished woman, no acute distress.  Fully ambulatory mild conversational dyspnea.  Voice changes during interview.  Occasionally aphonic. HEAD: Normocephalic, atraumatic.  EYES: Pupils equal, round, reactive to light.  No scleral icterus.  MOUTH: Oral mucosa moist.  No thrush. NECK: Supple. No thyromegaly. Trachea midline. No JVD.  No adenopathy. PULMONARY: Good air entry bilaterally.  No adventitious sounds. CARDIOVASCULAR: S1 and S2. Regular rate and rhythm.  No rubs, murmurs or gallops heard. ABDOMEN: Protuberant, otherwise benign. MUSCULOSKELETAL: No joint deformity, no clubbing, no edema.  NEUROLOGIC: No overt focal deficit, no gait  disturbance, speech is fluent. SKIN: Intact,warm,dry.  No overt rashes, exam limited. PSYCH: Anxious mood, normal behavior.   Lab Results  Component Value Date   NITRICOXIDE 31 08/01/2022      08/01/2022   10:00 AM  Results of the [facility] flowsheet  Sitting and reading 0  Watching TV 2  Sitting, inactive in a public place (e.g. a theatre or a meeting) 0  As a passenger in a car for an hour without a break 0  Lying down to rest in the afternoon when circumstances permit 3  Sitting and talking to someone 1  Sitting quietly after a lunch without alcohol 1  In a car, while stopped for a few minutes in traffic 0  Total score 7       Assessment & Plan:     ICD-10-CM   1. SOB (shortness of breath)  R06.02 Nitric oxide    Home sleep test    ANA+ENA+DNA/DS+Scl 70+SjoSSA/B    DG Chest 2 View    Rheumatoid factor    C-reactive protein    Sedimentation rate    Acetylcholine receptor, blocking    CANCELED: Acetylcholine receptor, blocking    CANCELED: Sedimentation rate    CANCELED: C-reactive protein    CANCELED: Rheumatoid factor   Out of proportion to her PFTs and CT chest findings Still some evidence of mild airways reactivity Continue Trelegy, refilled albuterol (never got)    2. Positive ANA (antinuclear antibody)  R76.8 ANA+ENA+DNA/DS+Scl 70+SjoSSA/B    DG Chest 2 View    Rheumatoid factor    C-reactive protein    Sedimentation rate    Acetylcholine receptor, blocking    CANCELED: Acetylcholine receptor, blocking    CANCELED: Sedimentation rate    CANCELED: C-reactive protein    CANCELED: Rheumatoid factor   Recheck titers Patient has complex CTD history    3. Mild persistent asthma without complication  Y09.98    Continue Trelegy Placed another prescription for albuterol    4. Non-restorative sleep  G47.8    Nonrestorative sleep plus snoring Past history of sleep apnea Home sleep test    5. Hoarseness or changing voice  R49.9    Improved but still  persistent Worse towards the  end of the day     Orders Placed This Encounter  Procedures   DG Chest 2 View    Standing Status:   Future    Standing Expiration Date:   08/02/2023    Order Specific Question:   Reason for Exam (SYMPTOM  OR DIAGNOSIS REQUIRED)    Answer:   dysnea    Order Specific Question:   Preferred imaging location?    Answer:   [facility]   ANA+ENA+DNA/DS+Scl 70+SjoSSA/B    Standing Status:   Future    Standing Expiration Date:   08/02/2023   Rheumatoid factor    Standing Status:   Future    Number of Occurrences:   1    Standing Expiration Date:   08/02/2023   C-reactive protein    Standing Status:   Future    Number of Occurrences:   1    Standing Expiration Date:   08/02/2023   Sedimentation rate    Standing Status:   Future    Number of Occurrences:   1    Standing Expiration Date:   08/02/2023   Acetylcholine receptor, blocking    Standing Status:   Future    Number of Occurrences:   1    Standing Expiration Date:   08/02/2023   Nitric oxide   Home sleep test    Standing Status:   Future    Standing Expiration Date:   08/02/2023    Order Specific Question:   Where should this test be performed:    Answer:   [facility]    Meds ordered this encounter  Medications   albuterol (VENTOLIN HFA) 108 (90 Base) MCG/ACT inhaler    Sig: Inhale 2 puffs into the lungs every 6 (six) hours as needed.    Dispense:  8 g    Refill:  2   Fluticasone-Umeclidin-Vilant (TRELEGY ELLIPTA) 200-62.5-25 MCG/ACT AEPB    Sig: Inhale 1 puff into the lungs daily.    Dispense:  14 each    Refill:  0    Order Specific Question:   Lot Number?    Answer:   [id number]    Order Specific Question:   Expiration Date?    Answer:   11/25/2023    Order Specific Question:   Quantity    Answer:   1   See the patient in follow-up in 6 to 8 weeks time she is to contact us prior to that time should any new problems arise.  C. Laura , MD Advanced Bronchoscopy P[facility]    *This note was dictated using voice recognition software/Dragon.  Despite best efforts to proofread, errors can occur which can change the meaning. Any transcriptional errors that result from this process are unintentional and may not be fully corrected at the time of dictation.    

## 2022-08-02 LAB — RHEUMATOID FACTOR: Rheumatoid fact SerPl-aCnc: 13.1 IU/mL (ref <14.0)

## 2022-08-03 ENCOUNTER — Encounter: Payer: Self-pay | Admitting: Family

## 2022-08-03 ENCOUNTER — Encounter: Payer: Self-pay | Admitting: Pharmacist

## 2022-08-03 ENCOUNTER — Ambulatory Visit: Payer: HMO | Attending: Family | Admitting: Family

## 2022-08-03 VITALS — BP 137/65 | HR 92 | Resp 16 | Ht 63.0 in | Wt 153.0 lb

## 2022-08-03 DIAGNOSIS — R0789 Other chest pain: Secondary | ICD-10-CM | POA: Diagnosis not present

## 2022-08-03 DIAGNOSIS — E785 Hyperlipidemia, unspecified: Secondary | ICD-10-CM | POA: Insufficient documentation

## 2022-08-03 DIAGNOSIS — I2510 Atherosclerotic heart disease of native coronary artery without angina pectoris: Secondary | ICD-10-CM | POA: Insufficient documentation

## 2022-08-03 DIAGNOSIS — M3500 Sicca syndrome, unspecified: Secondary | ICD-10-CM | POA: Diagnosis not present

## 2022-08-03 DIAGNOSIS — I110 Hypertensive heart disease with heart failure: Secondary | ICD-10-CM | POA: Diagnosis present

## 2022-08-03 DIAGNOSIS — E039 Hypothyroidism, unspecified: Secondary | ICD-10-CM | POA: Insufficient documentation

## 2022-08-03 DIAGNOSIS — E1165 Type 2 diabetes mellitus with hyperglycemia: Secondary | ICD-10-CM | POA: Diagnosis not present

## 2022-08-03 DIAGNOSIS — R21 Rash and other nonspecific skin eruption: Secondary | ICD-10-CM | POA: Diagnosis not present

## 2022-08-03 DIAGNOSIS — E139 Other specified diabetes mellitus without complications: Secondary | ICD-10-CM | POA: Diagnosis not present

## 2022-08-03 DIAGNOSIS — I5032 Chronic diastolic (congestive) heart failure: Secondary | ICD-10-CM | POA: Diagnosis not present

## 2022-08-03 DIAGNOSIS — Z794 Long term (current) use of insulin: Secondary | ICD-10-CM | POA: Insufficient documentation

## 2022-08-03 DIAGNOSIS — I10 Essential (primary) hypertension: Secondary | ICD-10-CM | POA: Diagnosis not present

## 2022-08-03 DIAGNOSIS — I509 Heart failure, unspecified: Secondary | ICD-10-CM | POA: Insufficient documentation

## 2022-08-03 DIAGNOSIS — Z79899 Other long term (current) drug therapy: Secondary | ICD-10-CM | POA: Diagnosis not present

## 2022-08-03 DIAGNOSIS — R059 Cough, unspecified: Secondary | ICD-10-CM | POA: Insufficient documentation

## 2022-08-03 DIAGNOSIS — M797 Fibromyalgia: Secondary | ICD-10-CM | POA: Diagnosis not present

## 2022-08-03 LAB — MISC LABCORP TEST (SEND OUT): Labcorp test code: 335931

## 2022-08-03 MED ORDER — METOPROLOL SUCCINATE ER 25 MG PO TB24: 25.0000 mg | ORAL_TABLET | Freq: Every day | ORAL | 5 refills | Status: DC

## 2022-08-03 NOTE — Patient Instructions (Addendum)
Continue weighing daily and call for an overnight weight gain of 3 pounds or more or a weekly weight gain of more than 5 pounds.   If you have voicemail, please make sure your mailbox is cleaned out so that we may leave a message and please make sure to listen to any voicemails.    Do not take anymore carvedilol.    Begin metoprolol succinate '25mg'$  daily in the evening.

## 2022-08-03 NOTE — Progress Notes (Unsigned)
Patient ID: Cassandra Benson, female   DOB: 03-02-1941, 81 y.o.   MRN: [id number]  [facility] - PHARMACIST COUNSELING NOTE  Guideline-Directed Medical Therapy/Evidence Based Medicine  *HFpEF*  ACE/ARB/ARNI: Losartan 25 mg daily Beta Blocker: Carvedilol 3.125 mg twice daily (skin reaction per MD report) Aldosterone Antagonist:  NONE Diuretic: Furosemide 20 mg daily SGLT2i:  none  Adherence Assessment  Do you ever forget to take your medication? _0 Yes _1 No  Do you ever skip doses due to side effects? _2 Yes _3 No  Do you have trouble affording your medicines? _4 Yes _5 No  Are you ever unable to pick up your medication due to transportation difficulties? _6 Yes _7 No  Do you ever stop taking your medications because you don't believe they are helping? _8 Yes _9 No  Do you check your weight daily? _10 Yes _11 No   Adherence strategy: pill box  Barriers to obtaining medications: none  Vital signs: HR 92, BP 137/65, weight (pounds) 153 lb ECHO: Date 12/24/21, EF 60-65%, notes  mild MR      Latest Ref Rng & Units 05/10/2022   10:00 AM 02/14/2022    3:18 PM 12/27/2021   10:24 AM  BMP  Glucose 70 - 99 mg/dL 503  482  290   BUN 8 - 23 mg/dL _12 Creatinine 0.44 - 1.00 mg/dL 0.91  1.01  1.09   BUN/Creat Ratio 12 - 28   13   Sodium 135 - 145 mmol/L 134  137  139   Potassium 3.5 - 5.1 mmol/L 3.6  4.0  3.9   Chloride 98 - 111 mmol/L 107  103  103   CO2 22 - 32 mmol/L _13 Calcium 8.9 - 10.3 mg/dL 9.2  9.4  9.1     Past Medical History:  Diagnosis Date   Acute diverticulitis 04/2021   [facility] ER, CT confirmed   Allergy    ANA positive    positive ANA pattern 1 speckled   Arthritis    Carotid stenosis, asymptomatic 06/19/2015   7-59% RICA 16-38% LICA rpt 1 yr (12/6657)    CHF (congestive heart failure) ([place])    Colon polyps    COVID-19 virus infection 09/14/2021   Dermatomyositis ([place])    Diabetes mellitus without  complication ([place])    Type 1   Diverticulosis    sigmoid on CT scan 12/2019   Family history of adverse reaction to anesthesia    brothr went into cardiac arrest from anectine   Fibromyalgia    prior PCP   GERD (gastroesophageal reflux disease)    prior PCP   Glaucoma    Narrow angle   History of blood clots    DVT, in 20s, none since   History of chicken pox    History of diverticulitis    History of pericarditis 1986   with hospitalization   History of pneumonia 2014   History of shingles    History of UTI    Hyperlipidemia    Hypertension    Hypothyroidism    Mixed connective tissue disease ([place])    Partial small bowel obstruction ([place]) 12/2019   managed conservatively   Peptic ulcer    Pneumonia    PONV (postoperative nausea and vomiting)    Raynaud's disease without gangrene    Shoulder pain left   h/o RTC tendonitis and adhesive capsulitis   Sigmoid diverticulitis 05/26/2021   Sjogren's syndrome ([place])    Sleep  apnea    prior PCP - no CPAP for about 10 yrs   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HCC)    Vitamin D deficiency    prior PCP    ASSESSMENT 81 year old female who presents to the HF clinic for follow u[. PMH includes  HTN, HLD, hypothyroidism, CAD, LADA, HA's, fibromyalgia, PAD, Sjogren's syndrome, and HFpEF. Last ED admission was 05/10/2022 for hyperglycemia.  MedRec was completed during [place]. Patient reports skin reaction with carvedilol. She decreased her dose from carvedilol 6.61m BID to 3.1281mBID without improvement in symptoms. Otherwise, reports compliance with therapy as written.   PLAN DC carvedilol (added to allergy list) Start metoprolol succinate 2558maily Continue all other medication as previously prescribed. Consider add SGLT2i and losartan titration during next OV once skin reaction cleared.   Time spent: 20 minutes  Jowanna Loeffler Rodriguez-Guzman PharmD, BCPS 08/03/2022 11:26 AM    Current Outpatient Medications:    acetaminophen (TYLENOL) 500 MG  tablet, Take 1 tablet (500 mg total) by mouth 3 (three) times daily as needed., Disp: , Rfl:    albuterol (VENTOLIN HFA) 108 (90 Base) MCG/ACT inhaler, Inhale 2 puffs into the lungs every 6 (six) hours as needed., Disp: 8 g, Rfl: 2   Ascorbic Acid (VITAMIN C) 100 MG tablet, Take 100 mg by mouth daily., Disp: , Rfl:    aspirin EC 81 MG tablet, Take 1 tablet (81 mg total) by mouth daily. Swallow whole., Disp: 30 tablet, Rfl: 12   baclofen 5 MG TABS, Take 5-10 mg by mouth 2 (two) times daily as needed for muscle spasms (occipital pain)., Disp: 30 tablet, Rfl: 0   BD ULTRA-FINE LANCETS lancets, Use as instructed upto 6 times daily, Disp: 600 each, Rfl: 2   Blood Glucose Monitoring Suppl (ONE TOUCH ULTRA 2) w/Device KIT, Use as directed to check sugars twice daily and as needed, Disp: 1 kit, Rfl: 0   Cholecalciferol (VITAMIN D3) 25 MCG (1000 UT) CAPS, Take 1 capsule (1,000 Units total) by mouth daily., Disp: 30 capsule, Rfl:    clindamycin (CLEOCIN T) 1 % external solution, Apply topically daily. As needed for acne, Disp: 30 mL, Rfl: 0   clopidogrel (PLAVIX) 75 MG tablet, TAKE ONE TABLET BY MOUTH EVERY EVENING, Disp: 30 tablet, Rfl: 6   COMFORT EZ PEN NEEDLES 32G X 4 MM MISC, Use 4-5x a day, Disp: 300 each, Rfl: 3   Continuous Blood Gluc Receiver (FREESTYLE LIBRE READER) DEVI, Use as directed for use with Freestyle Libre 3, Disp: 1 each, Rfl: 0   Continuous Blood Gluc Sensor (FREESTYLE LIBRE 3 SENSOR) MISC, Place 1 sensor on the skin every 14 days. Use to check glucose continuously, Disp: 2 each, Rfl: 3   diclofenac Sodium (VOLTAREN) 1 % GEL, Apply 2 g topically 3 (three) times daily., Disp: 100 g, Rfl: 1   Fluticasone-Umeclidin-Vilant (TRELEGY ELLIPTA) 200-62.5-25 MCG/ACT AEPB, Inhale 1 puff into the lungs daily., Disp: 14 each, Rfl: 0   furosemide (LASIX) 20 MG tablet, Take 1 tablet (20 mg total) by mouth daily., Disp: 30 tablet, Rfl: 5   Glucagon, rDNA, (GLUCAGON EMERGENCY) 1 MG KIT, INJECT into THE  muscle ONCE AS NEEDED FOR emergency, Disp: 1 kit, Rfl: 12   Insulin Aspart FlexPen (NOVOLOG) 100 UNIT/ML, USE UP TO 50 UNITS DAILY in insulin pump (Patient not taking: Reported on 08/01/2022), Disp: 50 mL, Rfl: 0   insulin degludec (TRESIBA FLEXTOUCH) 100 UNIT/ML FlexTouch Pen, Inject 56 Units into the skin daily., Disp: 3 mL, Rfl: 3  Insulin Syringe-Needle U-100 32G X 5/16" 0.5 ML MISC, Use as directed, Disp: 100 each, Rfl: 0   losartan (COZAAR) 25 MG tablet, Take 1 tablet (25 mg total) by mouth daily., Disp: 90 tablet, Rfl: 3   metoprolol succinate (TOPROL XL) 25 MG 24 hr tablet, Take 1 tablet (25 mg total) by mouth daily., Disp: 30 tablet, Rfl: 5   Multiple Vitamin (MULTIVITAMIN ADULT) TABS, Take 1 tablet by mouth in the morning and at bedtime., Disp: , Rfl:    nitrofurantoin, macrocrystal-monohydrate, (MACROBID) 100 MG capsule, Take 1 capsule (100 mg total) by mouth 2 (two) times daily. (Patient not taking: Reported on 08/01/2022), Disp: 14 capsule, Rfl: 0   ONETOUCH ULTRA test strip, USE TO check blood glucose FOUR TIMES DAILY, Disp: 400 strip, Rfl: 2   rosuvastatin (CRESTOR) 20 MG tablet, TAKE ONE TABLET BY MOUTH EVERY EVENING, Disp: 90 tablet, Rfl: 2   SYNTHROID 75 MCG tablet, TAKE ONE TABLET BY MOUTH Monday-Saturday BEFORE breakfast, Disp: 80 tablet, Rfl: 1   topiramate (TOPAMAX) 50 MG tablet, Take 1 tablet (50 mg total) by mouth 2 (two) times daily., Disp: 60 tablet, Rfl: 12   MEDICATION ADHERENCES TIPS AND STRATEGIES Taking medication as prescribed improves patient outcomes in heart failure (reduces hospitalizations, improves symptoms, increases survival) Side effects of medications can be managed by decreasing doses, switching agents, stopping drugs, or adding additional therapy. Please let someone in the [facility] know if you have having bothersome side effects so we can modify your regimen. Do not alter your medication regimen without talking to [place].  Medication reminders can  help patients remember to take drugs on time. If you are missing or forgetting doses you can try linking behaviors, using pill boxes, or an electronic reminder like an alarm on your phone or an app. Some people can also get automated phone calls as medication reminders.

## 2022-08-03 NOTE — Progress Notes (Signed)
Patient ID: Cassandra Benson, female    DOB: 1941/08/21, 81 y.o.   MRN: [id number]  HPI  Cassandra Benson is a 81 yo female with a PMHx significant for HTN, HLD, hypothyroidism, CAD, LADA, HA's, fibromyalgia, PAD, Sjogren's syndrome, and CHF.   Echo report from 12/24/21 reviewed and showed an EF of 60-65% along with mild MR. Echo from 07/30/20 reviewed and showed an EF of 55-60% with grade II diastolic dysfunction.   Was in the ED 05/10/22 due to hyperglycemia after course of steroids. Home glucose >600. Treated with IVF, IV insulin and oral potassium and she was released.   She presents today for a follow-up visit with a chief complaint of moderate shortness of breath with very little exertion. She says that this has been chronic in nature although remains unclear of "why this happened". She has associated chest tightness, occasional wheezing, raspiness to voice at times, fatigue, dizziness & rash on face (developed after carvedilol) along with this. Denies difficulty sleeping, abdominal distention, palpitations, pedal edema or chest pain.   Not weighing herself but does have working scales at home.   Past Medical History:  Diagnosis Date   Acute diverticulitis 04/2021   [facility] ER, CT confirmed   Allergy    ANA positive    positive ANA pattern 1 speckled   Arthritis    Carotid stenosis, asymptomatic 06/19/2015   7-33% RICA 78-01% LICA rpt 1 yr (0/8106)    CHF (congestive heart failure) ([place])    Colon polyps    COVID-19 virus infection 09/14/2021   Dermatomyositis ([place])    Diabetes mellitus without complication ([place])    Type 1   Diverticulosis    sigmoid on CT scan 12/2019   Family history of adverse reaction to anesthesia    brothr went into cardiac arrest from anectine   Fibromyalgia    prior PCP   GERD (gastroesophageal reflux disease)    prior PCP   Glaucoma    Narrow angle   History of blood clots    DVT, in 20s, none since   History of chicken pox    History of  diverticulitis    History of pericarditis 1986   with hospitalization   History of pneumonia 2014   History of shingles    History of UTI    Hyperlipidemia    Hypertension    Hypothyroidism    Mixed connective tissue disease ([place])    Partial small bowel obstruction ([place]) 12/2019   managed conservatively   Peptic ulcer    Pneumonia    PONV (postoperative nausea and vomiting)    Raynaud's disease without gangrene    Shoulder pain left   h/o RTC tendonitis and adhesive capsulitis   Sigmoid diverticulitis 05/26/2021   Sjogren's syndrome ([place])    Sleep apnea    prior PCP - no CPAP for about 10 yrs   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[place])    Vitamin D deficiency    prior PCP   Past Surgical History:  Procedure Laterality Date   ABDOMINAL HYSTERECTOMY  1978   fibroids and menorrhagia, ovaries remain   ARTERY BIOPSY Right 04/06/2018   Procedure: BIOPSY TEMPORAL ARTERY RIGHT;  Surgeon: Beverly Gust, MD;  Location: [place];  Service: ENT;  Laterality: Right;  Diabetic - insulin pump sleep apnea   [facility] normal per patient   COLONOSCOPY  10/2011   1 TA, 1 HP, very tortuous colon ([place])   COLONOSCOPY  02/2020  TA, inflammatory polyp, mod diverticulosis, int/ext hemorrhoids ([place]) no rpt recommended    COLONOSCOPY WITH ESOPHAGOGASTRODUODENOSCOPY (EGD)  03/2007   2 ulcers, benign polyp, rpt 5 yrs [facility])   JOINT REPLACEMENT Right    hip   PARTIAL HIP ARTHROPLASTY  2013   Right hip replacement   TONSILLECTOMY     TONSILLECTOMY AND ADENOIDECTOMY     TRANSCAROTID ARTERY REVASCULARIZATION  Left 07/23/2018   Procedure: TRANSCAROTID ARTERY REVASCULARIZATION;  Surgeon: Marty Heck, MD;  Location: [facility] OR;  Service: Vascular;  Laterality: Left;   TUBAL LIGATION     VAGINAL DELIVERY     x2, no complications   Family History  Problem Relation Age of Onset   CAD Mother 14       MI, aortic valve issues   COPD Mother     Lupus Mother    Cassandra Benson' disease Mother    Rheum arthritis Mother    CAD Father 53       CABG x2, aortic valve replacement   Stroke Sister    CAD Sister    48 Sister        brain   Lupus Sister    Diabetes Sister    Alcohol abuse Brother    CAD Brother 75       MI   Stroke Brother    Seizures Son    COPD Brother        agent orange   CAD Brother 61       stent   Diabetes Brother    Depression Grandchild    Breast cancer Maternal Aunt    Diabetes Sister    Breast cancer Sister    Breast cancer Maternal Aunt    Stroke Maternal Grandmother    Hypertension Maternal Grandmother    Gallbladder disease Maternal Grandmother    Colon cancer Neg Hx    Esophageal cancer Neg Hx    Rectal cancer Neg Hx    Stomach cancer Neg Hx    Social History   Tobacco Use   Smoking status: Never   Smokeless tobacco: Never  Substance Use Topics   Alcohol use: No   Allergies  Allergen Reactions   Iodinated Contrast Media Other (See Comments)    Itching (severe) and chest tightness   Penicillins Anaphylaxis, Swelling, Rash and Other (See Comments)    Has patient had a PCN reaction causing immediate rash, facial/tongue/throat swelling, SOB or lightheadedness with hypotension: Yes Has patient had a PCN reaction causing severe rash involving mucus membranes or skin necrosis: yes - remotely Has patient had a PCN reaction that required hospitalization: occurred while hospitalized Has patient had a PCN reaction occurring within the last 10 years: No If all of the above answers are "NO", then may proceed with Cephalosporin use.    Amlodipine Swelling    Pedal edema   Anectine [Succinylcholine] Other (See Comments)    Brother went into cardiac arrest.   Codeine Nausea Only   Gabapentin Other (See Comments)    Gait abnormality   Influenza Vaccines Other (See Comments)    Muscle weakness; unable to walk   Nortriptyline Other (See Comments)    Eye swelling and mouth drawed up   Pamelor  [Nortriptyline Hcl] Other (See Comments)    Patient states caused her face to draw in together.   Prednisone     Marked severe hyperglycemia to oral and IM steroids   Valsartan Other (See Comments) and Cough    Allergy to generic  only, "Hacking" cough   Zetia [Ezetimibe] Other (See Comments)    Bad muscle cramps   Erythromycin Rash and Swelling   Sulfa Antibiotics Rash   Prior to Admission medications   Medication Sig Start Date End Date Taking? Authorizing Provider  acetaminophen (TYLENOL) 500 MG tablet Take 1 tablet (500 mg total) by mouth 3 (three) times daily as needed. 01/03/20  Yes [name], MD  Ascorbic Acid (VITAMIN C) 100 MG tablet Take 100 mg by mouth daily.   Yes [provider]  BD ULTRA-FINE LANCETS lancets Use as instructed upto 6 times daily 09/02/14  Yes Phadke, Radhika P, MD  carvedilol (COREG) 3.125 MG tablet Take 1 tablet (3.125 mg total) by mouth 2 (two) times daily with a meal. 11/04/21  Yes [name], MD  Cholecalciferol (VITAMIN D3) 25 MCG (1000 UT) CAPS Take 1 capsule (1,000 Units total) by mouth daily. 01/18/22  Yes [name], MD  clopidogrel (PLAVIX) 75 MG tablet TAKE ONE TABLET BY MOUTH EVERY EVENING 07/15/21  Yes Waynetta Sandy, MD  COMFORT EZ PEN NEEDLES 32G X 4 MM MISC Use 4-5x a day 11/18/21  Yes Philemon Kingdom, MD  Continuous Blood Gluc Transmit (DEXCOM G6 TRANSMITTER) MISC 1 kit by Does not apply route See admin instructions. Use to check blood sugar 09/18/19  Yes Philemon Kingdom, MD  diclofenac Sodium (VOLTAREN) 1 % GEL Apply 2 g topically 3 (three) times daily. 03/15/20  Yes [name], MD  furosemide (LASIX) 20 MG tablet Take 1 tablet (20 mg total) by mouth daily. 12/16/21 12/16/22 Yes Vladimir Crofts, MD  Glucagon, rDNA, (GLUCAGON EMERGENCY) 1 MG KIT INJECT into THE muscle ONCE AS NEEDED FOR emergency 11/18/21  Yes Philemon Kingdom, MD  Insulin Aspart FlexPen (NOVOLOG) 100 UNIT/ML USE UP TO 50 UNITS DAILY in  insulin pump Patient taking differently: 10 - 12 units with each meal 05/20/21  Yes Philemon Kingdom, MD  losartan (COZAAR) 25 MG tablet TAKE ONE TABLET BY MOUTH ONCE DAILY 01/13/22  Yes [name], MD  Multiple Vitamin (MULTIVITAMIN ADULT) TABS Take 1 tablet by mouth in the morning and at bedtime. 01/18/22  Yes [name], MD  [facility] ULTRA test strip USE TO check blood glucose FOUR TIMES DAILY 10/14/21  Yes Philemon Kingdom, MD  rosuvastatin (CRESTOR) 20 MG tablet TAKE ONE TABLET BY MOUTH EVERY EVENING 01/12/22  Yes [name], MD  SYNTHROID 75 MCG tablet TAKE ONE TABLET BY MOUTH monday-saturday before breakfast 09/20/21  Yes [name], MD  topiramate (TOPAMAX) 50 MG tablet Take 1 tablet (50 mg total) by mouth 2 (two) times daily. 09/08/21  Yes Penumalli, Earlean Polka, MD  TRESIBA FLEXTOUCH 100 UNIT/ML FlexTouch Pen Inject 46 Units into the skin daily. 12/14/21  Yes Philemon Kingdom, MD   Review of Systems  Constitutional:  Positive for fatigue. Negative for appetite change.  HENT:  Positive for voice change (raspy voice at times). Negative for congestion, postnasal drip and sore throat.   Eyes: Negative.   Respiratory:  Positive for chest tightness, shortness of breath (easily) and wheezing (yesterday).   Cardiovascular:  Negative for chest pain and palpitations.  Gastrointestinal:  Negative for abdominal distention and abdominal pain.  Endocrine: Negative.   Genitourinary: Negative.   Musculoskeletal:  Positive for arthralgias (right hip). Negative for back pain.  Skin:  Positive for rash (on both sides of face).  Allergic/Immunologic: Negative.   Neurological:  Positive for dizziness and light-headedness.  Hematological:  Negative for adenopathy. Does not bruise/bleed easily.  Psychiatric/Behavioral:  Negative for dysphoric mood and sleep disturbance (sleeping on 1 pillow; wakes up some during the night for "no rhyme or reason"). The patient is not nervous/anxious.      Vitals:   08/03/22 0948  BP: 137/65  Pulse: 92  Resp: 16  SpO2: 100%  Weight: 153 lb (69.4 kg)  Height: 5' 3" (1.6 m)   Wt Readings from Last 3 Encounters:  08/03/22 153 lb (69.4 kg)  08/01/22 150 lb 3.2 oz (68.1 kg)  06/22/22 148 lb 8 oz (67.4 kg)   Lab Results  Component Value Date   CREATININE 0.91 05/10/2022   CREATININE 1.01 02/14/2022   CREATININE 1.09 (H) 12/27/2021   Physical Exam Vitals and nursing note reviewed.  Constitutional:      Appearance: Normal appearance.  HENT:     Head: Normocephalic and atraumatic.  Cardiovascular:     Rate and Rhythm: Normal rate and regular rhythm.  Pulmonary:     Effort: Pulmonary effort is normal. No respiratory distress.     Breath sounds: No wheezing or rales.  Abdominal:     General: There is no distension.     Palpations: Abdomen is soft.  Musculoskeletal:        General: No tenderness.     Cervical back: Normal range of motion and neck supple.     Right lower leg: Edema (trace pitting) present.     Left lower leg: Edema (trace pitting) present.  Skin:    General: Skin is warm and dry.  Neurological:     General: No focal deficit present.     Mental Status: She is alert and oriented to person, place, and time.  Psychiatric:        Mood and Affect: Mood normal.        Behavior: Behavior normal.        Thought Content: Thought content normal.    Assessment and Plan:  Heart failure with preserved EF without structural changes - NYHA III - euvolemic today - not weighing daily but has working scales; instructed to resume weighing daily so that she can call for an overnight weight gain of >2 pounds or a weekly weight gain of >5 pounds - weight down 3.8 pounds from last visit here 6 months ago - adhering to low sodium diet and fluid restriction - on cozaar, coreg, lasix - valsartan caused hacking cough - will stop carvedilol due to facial rash and begin metoprolol succinate 44m once daily - can not add SGLT2  due to LADA - saw cardiology (Rockey Situ 03/15/22 - saw pulmonology (Patsey Berthold 08/01/22; returns 09/15/22 - EKG done in the office due to intermittent chest tightness and it was normal; explained that if this continues, to contact Dr GDonivan Sculloffice  2. HTN - BP looks good (137/65) - saw PCP (Danise Mina 06/22/22; returns 08/05/22 - BMP 05/10/22 reviewed: Na 134, K 3.6, Cr 0.91, gfr >60    3. LADA - saw endocrinology (Renne Crigleron 11/09/21) - glucose at home today was 77  - A1c 06/22/22 was 8.8%   Medication bottles reviewed.   Return in 6 months, sooner if needed.

## 2022-08-05 ENCOUNTER — Encounter: Payer: Self-pay | Admitting: Family Medicine

## 2022-08-05 ENCOUNTER — Ambulatory Visit (INDEPENDENT_AMBULATORY_CARE_PROVIDER_SITE_OTHER): Payer: HMO | Admitting: Family Medicine

## 2022-08-05 VITALS — BP 126/64 | HR 100 | Temp 97.8°F | Ht 63.0 in | Wt 153.0 lb

## 2022-08-05 DIAGNOSIS — R0609 Other forms of dyspnea: Secondary | ICD-10-CM

## 2022-08-05 DIAGNOSIS — R739 Hyperglycemia, unspecified: Secondary | ICD-10-CM

## 2022-08-05 DIAGNOSIS — T380X5A Adverse effect of glucocorticoids and synthetic analogues, initial encounter: Secondary | ICD-10-CM

## 2022-08-05 DIAGNOSIS — Z888 Allergy status to other drugs, medicaments and biological substances status: Secondary | ICD-10-CM | POA: Diagnosis not present

## 2022-08-05 DIAGNOSIS — E139 Other specified diabetes mellitus without complications: Secondary | ICD-10-CM

## 2022-08-05 MED ORDER — COMFORT EZ PEN NEEDLES 33G X 4 MM MISC: 3 refills | Status: DC

## 2022-08-05 MED ORDER — FREESTYLE LIBRE READER DEVI: 0 refills | Status: DC

## 2022-08-05 NOTE — Patient Instructions (Addendum)
Trelegy controller inhaler is 1 puff daily. You can use rescue albuterol inhaler as needed.  Call and cancel neurology appointment if headaches are better.  I will send in [place] 3 reader to use with sensor - and you may not need new phone.  Return in 2 months for physical/wellness visit.  Good to see you today!

## 2022-08-05 NOTE — Progress Notes (Unsigned)
Patient ID: Cassandra Benson, female    DOB: 30-Jun-1941, 81 y.o.   MRN: [id number]  This visit was conducted in person.  BP 126/64   Pulse 100   Temp 97.8 F (36.6 C) (Temporal)   Ht _0  (1.6 m)   Wt 153 lb (69.4 kg)   SpO2 97%   BMI 27.10 kg/m    CC: 3 mo f/u visit  Subjective:   HPI: Cassandra Benson is a 80 y.o. female presenting on 08/05/2022 for Follow-up (Here for 3 mo f/u.  )   LADA - has not recently seen endocrinology - in between endocrinologists at this time. Current regimen includes tresiba 46u daily. Planning to see endocrinology Dr Honor Junes.  Yesterday morning 77, this morning 87. She is doing fingersticks. She is planning to get new phone to be able to use for freestyle libre 3. She requests freestyle libre 3 reader. Upcoming eye exam next month.  Lab Results  Component Value Date   HGBA1C 8.8 (A) 06/22/2022    Diabetic Foot Exam - Simple   Simple Foot Form Diabetic Foot exam was performed with the following findings: Yes 08/05/2022  9:41 AM  Visual Inspection No deformities, no ulcerations, no other skin breakdown bilaterally: Yes Sensation Testing Intact to touch and monofilament testing bilaterally: Yes Pulse Check Posterior Tibialis and Dorsalis pulse intact bilaterally: Yes Comments 2+ DP bilaterally     Chronic dyspnea - saw Dr Patsey Berthold for asthma, CT not conclusive for pulm fibrosis. Last saw pulm this week - felt Trelegy was helping her shortness of breath. Autoimmune workup returned negative. Planned home sleep test. Most recently saw CHF clinic earlier this week as well - carvedilol changed to metoprolol succinate 43m daily. She continues coreg and lasix. NYHA stage III.   Looking into PAP for Trelegy.   Occipital pain - suspicious for occipital neuralgia - tried baclofen (gabapentin and TCA intolerance), referred to PM&R. Avoid steroid injections due to steroid induced hyperglycemia in the past. HA actually better off insulin  aspart.      Relevant past medical, surgical, family and social history reviewed and updated as indicated. Interim medical history since our last visit reviewed. Allergies and medications reviewed and updated. Outpatient Medications Prior to Visit  Medication Sig Dispense Refill   acetaminophen (TYLENOL) 500 MG tablet Take 1 tablet (500 mg total) by mouth 3 (three) times daily as needed.     albuterol (VENTOLIN HFA) 108 (90 Base) MCG/ACT inhaler Inhale 2 puffs into the lungs every 6 (six) hours as needed. 8 g 2   Ascorbic Acid (VITAMIN C) 100 MG tablet Take 100 mg by mouth daily.     aspirin EC 81 MG tablet Take 1 tablet (81 mg total) by mouth daily. Swallow whole. 30 tablet 12   baclofen 5 MG TABS Take 5-10 mg by mouth 2 (two) times daily as needed for muscle spasms (occipital pain). 30 tablet 0   BD ULTRA-FINE LANCETS lancets Use as instructed upto 6 times daily 600 each 2   Blood Glucose Monitoring Suppl (ONE TOUCH ULTRA 2) w/Device KIT Use as directed to check sugars twice daily and as needed 1 kit 0   Cholecalciferol (VITAMIN D3) 25 MCG (1000 UT) CAPS Take 1 capsule (1,000 Units total) by mouth daily. 30 capsule    clindamycin (CLEOCIN T) 1 % external solution Apply topically daily. As needed for acne 30 mL 0   clopidogrel (PLAVIX) 75 MG tablet TAKE ONE TABLET BY MOUTH EVERY EVENING  30 tablet 6   Continuous Blood Gluc Sensor (FREESTYLE LIBRE 3 SENSOR) MISC Place 1 sensor on the skin every 14 days. Use to check glucose continuously 2 each 3   diclofenac Sodium (VOLTAREN) 1 % GEL Apply 2 g topically 3 (three) times daily. 100 g 1   Fluticasone-Umeclidin-Vilant (TRELEGY ELLIPTA) 200-62.5-25 MCG/ACT AEPB Inhale 1 puff into the lungs daily. 14 each 0   furosemide (LASIX) 20 MG tablet Take 1 tablet (20 mg total) by mouth daily. 30 tablet 5   Glucagon, rDNA, (GLUCAGON EMERGENCY) 1 MG KIT INJECT into THE muscle ONCE AS NEEDED FOR emergency 1 kit 12   insulin degludec (TRESIBA FLEXTOUCH) 100  UNIT/ML FlexTouch Pen Inject 56 Units into the skin daily. 3 mL 3   Insulin Syringe-Needle U-100 32G X 5/16" 0.5 ML MISC Use as directed 100 each 0   losartan (COZAAR) 25 MG tablet Take 1 tablet (25 mg total) by mouth daily. 90 tablet 3   metoprolol succinate (TOPROL XL) 25 MG 24 hr tablet Take 1 tablet (25 mg total) by mouth daily. 30 tablet 5   Multiple Vitamin (MULTIVITAMIN ADULT) TABS Take 1 tablet by mouth in the morning and at bedtime.     ONETOUCH ULTRA test strip USE TO check blood glucose FOUR TIMES DAILY 400 strip 2   rosuvastatin (CRESTOR) 20 MG tablet TAKE ONE TABLET BY MOUTH EVERY EVENING 90 tablet 2   SYNTHROID 75 MCG tablet TAKE ONE TABLET BY MOUTH Monday-Saturday BEFORE breakfast 80 tablet 1   topiramate (TOPAMAX) 50 MG tablet Take 1 tablet (50 mg total) by mouth 2 (two) times daily. 60 tablet 12   COMFORT EZ PEN NEEDLES 32G X 4 MM MISC Use 4-5x a day 300 each 3   Continuous Blood Gluc Receiver (FREESTYLE LIBRE READER) DEVI Use as directed for use with Freestyle Libre 3 1 each 0   Insulin Aspart FlexPen (NOVOLOG) 100 UNIT/ML USE UP TO 50 UNITS DAILY in insulin pump 50 mL 0   nitrofurantoin, macrocrystal-monohydrate, (MACROBID) 100 MG capsule Take 1 capsule (100 mg total) by mouth 2 (two) times daily. 14 capsule 0   No facility-administered medications prior to visit.     Per HPI unless specifically indicated in ROS section below Review of Systems  Objective:  BP 126/64   Pulse 100   Temp 97.8 F (36.6 C) (Temporal)   Ht _0  (1.6 m)   Wt 153 lb (69.4 kg)   SpO2 97%   BMI 27.10 kg/m   Wt Readings from Last 3 Encounters:  08/05/22 153 lb (69.4 kg)  08/03/22 153 lb (69.4 kg)  08/01/22 150 lb 3.2 oz (68.1 kg)      Physical Exam Vitals and nursing note reviewed.  Constitutional:      Appearance: Normal appearance. She is not ill-appearing.  HENT:     Mouth/Throat:     Mouth: Mucous membranes are moist.     Pharynx: Oropharynx is clear. No oropharyngeal exudate  or posterior oropharyngeal erythema.  Eyes:     General:        Right eye: No discharge.        Left eye: No discharge.     Extraocular Movements: Extraocular movements intact.     Conjunctiva/sclera: Conjunctivae normal.     Pupils: Pupils are equal, round, and reactive to light.  Cardiovascular:     Rate and Rhythm: Normal rate and regular rhythm.     Pulses: Normal pulses.     Heart sounds: Normal  heart sounds. No murmur heard. Pulmonary:     Effort: Pulmonary effort is normal. No respiratory distress.     Breath sounds: Normal breath sounds. No wheezing, rhonchi or rales.  Musculoskeletal:     Right lower leg: No edema.     Left lower leg: No edema.  Skin:    General: Skin is warm and dry.     Findings: Rash (facial to cheeks) present. Rash is papular and pustular.  Neurological:     Mental Status: She is alert.  Psychiatric:        Mood and Affect: Mood normal.        Behavior: Behavior normal.       Results for orders placed or performed during the hospital encounter of 08/01/22  Sedimentation rate  Result Value Ref Range   Sed Rate 33 (H) 0 - 30 mm/hr  C-reactive protein  Result Value Ref Range   CRP 0.6 <1.0 mg/dL  Rheumatoid factor  Result Value Ref Range   Rhuematoid fact SerPl-aCnc 13.1 <14.0 IU/mL  Miscellaneous [facility] test (send-out)  Result Value Ref Range   [facility] test code [id number]    [facility] test name ANA AND ENA    Misc [facility] result COMMENT     Assessment & Plan:   Problem List Items Addressed This Visit     LADA (latent autoimmune diabetes in adults), managed as type 1 ([place]) - Primary    Chronic, too soon for A1c however she notes improved glycemic control since stopping insulin aspart. Also notes headaches have resolved off aspart. Will add this to allergy list.  Having difficulty with Freestyle Libre 3 reader - I have sent reader to pharmacy but will check with my pharmacist to see if there's any specific DME we need to use with medicare for  [facility] 3 reader.  Pending establishing with [facility] Endocrinology Dr Honor Junes.       Relevant Medications   Insulin Pen Needle (COMFORT EZ PEN NEEDLES) 33G X 4 MM MISC   Chronic dyspnea    Appreciate pulmonary and cardiology care.  Workup so far unrevealing as to cause of chronic dyspnea.  She does not improvement with Trelegy - reviewed controller medication vs rescue inhaler. She is looking into PAP through pulm for Trelegy.  Has pulm f/u planned for next month.       Steroid-induced hyperglycemia    Recommend avoiding oral and injection steroid as able given her sensitivity to hyperglycemic effect.       Allergy to beta blocker    Facial acne-type rash to carvedilol - cardiology recently changed to metoprolol succinate. Will monitor effect on facial acne.         Meds ordered this encounter  Medications   Continuous Blood Gluc Receiver (FREESTYLE LIBRE READER) DEVI    Sig: For Freestyle Libre 3 - reader to use as directed to check sugars daily E13.9    Dispense:  1 each    Refill:  0   Insulin Pen Needle (COMFORT EZ PEN NEEDLES) 33G X 4 MM MISC    Sig: Use 4-5 times a day    Dispense:  300 each    Refill:  3   No orders of the defined types were placed in this encounter.    Patient Instructions  Trelegy controller inhaler is 1 puff daily. You can use rescue albuterol inhaler as needed.  Call and cancel neurology appointment if headaches are better.  I will send in [place] 3 reader to use  with sensor - and you may not need new phone.  Return in 2 months for physical/wellness visit.  Good to see you today!   Follow up plan: Return in about 2 months (around 10/05/2022) for medicare wellness visit, annual exam, prior fasting for blood work.  [name], MD

## 2022-08-06 ENCOUNTER — Encounter: Payer: Self-pay | Admitting: Family Medicine

## 2022-08-06 DIAGNOSIS — Z888 Allergy status to other drugs, medicaments and biological substances status: Secondary | ICD-10-CM | POA: Insufficient documentation

## 2022-08-06 NOTE — Assessment & Plan Note (Signed)
Appreciate pulmonary and cardiology care.  Workup so far unrevealing as to cause of chronic dyspnea.  She does not improvement with Trelegy - reviewed controller medication vs rescue inhaler. She is looking into PAP through pulm for Trelegy.  Has pulm f/u planned for next month.

## 2022-08-06 NOTE — Assessment & Plan Note (Signed)
Chronic, too soon for A1c however she notes improved glycemic control since stopping insulin aspart. Also notes headaches have resolved off aspart. Will add this to allergy list.  Having difficulty with Freestyle Libre 3 reader - I have sent reader to pharmacy but will check with my pharmacist to see if there's any specific DME we need to use with medicare for [facility] 3 reader.  Pending establishing with [facility] Endocrinology Dr Honor Junes.

## 2022-08-06 NOTE — Assessment & Plan Note (Addendum)
Recommend avoiding oral and injection steroid as able given her sensitivity to hyperglycemic effect.

## 2022-08-06 NOTE — Assessment & Plan Note (Signed)
Facial acne-type rash to carvedilol - cardiology recently changed to metoprolol succinate. Will monitor effect on facial acne.

## 2022-08-07 ENCOUNTER — Other Ambulatory Visit: Payer: Self-pay | Admitting: Family Medicine

## 2022-08-07 DIAGNOSIS — E1169 Type 2 diabetes mellitus with other specified complication: Secondary | ICD-10-CM

## 2022-08-09 LAB — ACETYLCHOLINE RECEPTOR, BLOCKING: Acetylchol Block Ab: 18 % (ref 0–25)

## 2022-08-14 ENCOUNTER — Encounter: Payer: Self-pay | Admitting: Family Medicine

## 2022-08-16 ENCOUNTER — Encounter: Payer: Self-pay | Admitting: Family Medicine

## 2022-08-16 ENCOUNTER — Ambulatory Visit (INDEPENDENT_AMBULATORY_CARE_PROVIDER_SITE_OTHER)
Admission: RE | Admit: 2022-08-16 | Discharge: 2022-08-16 | Disposition: A | Discharge status: Home / Self Care | Payer: HMO | Source: Ambulatory Visit | Attending: Family Medicine | Admitting: Family Medicine

## 2022-08-16 ENCOUNTER — Ambulatory Visit (INDEPENDENT_AMBULATORY_CARE_PROVIDER_SITE_OTHER): Payer: HMO | Admitting: Family Medicine

## 2022-08-16 VITALS — BP 108/60 | HR 88 | Temp 97.7°F | Ht 63.0 in | Wt 151.0 lb

## 2022-08-16 DIAGNOSIS — R109 Unspecified abdominal pain: Secondary | ICD-10-CM

## 2022-08-16 DIAGNOSIS — R079 Chest pain, unspecified: Secondary | ICD-10-CM

## 2022-08-16 DIAGNOSIS — M546 Pain in thoracic spine: Secondary | ICD-10-CM | POA: Diagnosis not present

## 2022-08-16 DIAGNOSIS — R0609 Other forms of dyspnea: Secondary | ICD-10-CM | POA: Diagnosis not present

## 2022-08-16 DIAGNOSIS — R519 Headache, unspecified: Secondary | ICD-10-CM

## 2022-08-16 DIAGNOSIS — G8929 Other chronic pain: Secondary | ICD-10-CM

## 2022-08-16 LAB — COMPREHENSIVE METABOLIC PANEL
ALT: 19 U/L (ref 0–35)
AST: 19 U/L (ref 0–37)
Albumin: 4.4 g/dL (ref 3.5–5.2)
Alkaline Phosphatase: 83 U/L (ref 39–117)
BUN: 15 mg/dL (ref 6–23)
CO2: 31 mEq/L (ref 19–32)
Calcium: 9.7 mg/dL (ref 8.4–10.5)
Chloride: 105 mEq/L (ref 96–112)
Creatinine, Ser: 0.95 mg/dL (ref 0.40–1.20)
GFR: 56.73 mL/min — ABNORMAL LOW (ref >60.00)
Glucose, Bld: 139 mg/dL — ABNORMAL HIGH (ref 70–99)
Potassium: 4.2 mEq/L (ref 3.5–5.1)
Sodium: 142 mEq/L (ref 135–145)
Total Bilirubin: 0.4 mg/dL (ref 0.2–1.2)
Total Protein: 6.8 g/dL (ref 6.0–8.3)

## 2022-08-16 LAB — CBC WITH DIFFERENTIAL/PLATELET
Basophils Absolute: 0.1 10*3/uL (ref 0.0–0.1)
Basophils Relative: 1 % (ref 0.0–3.0)
Eosinophils Absolute: 0 10*3/uL (ref 0.0–0.7)
Eosinophils Relative: 0.5 % (ref 0.0–5.0)
HCT: 42.6 % (ref 36.0–46.0)
Hemoglobin: 14.2 g/dL (ref 12.0–15.0)
Lymphocytes Relative: 18.3 % (ref 12.0–46.0)
Lymphs Abs: 1.5 10*3/uL (ref 0.7–4.0)
MCHC: 33.3 g/dL (ref 30.0–36.0)
MCV: 83.2 fl (ref 78.0–100.0)
Monocytes Absolute: 0.5 10*3/uL (ref 0.1–1.0)
Monocytes Relative: 6.4 % (ref 3.0–12.0)
Neutro Abs: 6.2 10*3/uL (ref 1.4–7.7)
Neutrophils Relative %: 73.8 % (ref 43.0–77.0)
Platelets: 241 10*3/uL (ref 150.0–400.0)
RBC: 5.12 Mil/uL — ABNORMAL HIGH (ref 3.87–5.11)
RDW: 14.3 % (ref 11.5–15.5)
WBC: 8.4 10*3/uL (ref 4.0–10.5)

## 2022-08-16 LAB — BRAIN NATRIURETIC PEPTIDE: Pro B Natriuretic peptide (BNP): 46 pg/mL (ref 0.0–100.0)

## 2022-08-16 NOTE — Telephone Encounter (Signed)
Spoke to patient by telephone. Patient stated that she can be here at the office today 08/16/22 at 12:45 pm.

## 2022-08-16 NOTE — Progress Notes (Signed)
Patient ID: Cassandra Benson, female    DOB: 1941/09/11, 81 y.o.   MRN: [id number]  This visit was conducted in person.  BP 108/60   Pulse 88   Temp 97.7 F (36.5 C) (Temporal)   Ht 5' 3" (1.6 m)   Wt 151 lb (68.5 kg)   SpO2 98%   BMI 26.75 kg/m   BP Readings from Last 3 Encounters:  08/16/22 108/60  08/05/22 126/64  08/03/22 137/65    CC: dyspnea Subjective:   HPI: Cassandra Benson is a 81 y.o. female presenting on 08/16/2022 for Shortness of Breath (Fells like she isnt getting deep breaths. O@ is good at 52 with ambulation)   Friend drove her here today.   Chronic dyspnea - sees pulm and cardiology. Thought asthma but not pulmonary fibrosis. Has been taking Trelegy with benefit. 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 Autoimmune evaluation was negative. Pending HST. She is now taking metoprolol succinate 55m daily in place of carvedilol, as well as lasix 250mdaily and losartan.   Facial acne has resolved off carvedilol.   Presents today with episode of difficulty breathing as well as easy dyspnea and dizziness with exertion "wipes me out". She notes ongoing midline thoracic back pain and right sided chest discomfort. She even got winded talking on the phone yesterday and this morning. Episodes come and go. Today she's feeling better than yesterday. Yesterday noted some right leg swelling with warmth but that has largely resolved.   Easily loses voice as well. No significant allergic rhinitis symptoms.  Home BP running well - denies low BP readings.      Relevant past medical, surgical, family and social history reviewed and updated as indicated. Interim medical history since our last visit reviewed. Allergies and medications reviewed and updated. Outpatient Medications Prior to Visit  Medication Sig Dispense Refill   acetaminophen (TYLENOL) 500 MG tablet Take 1 tablet (500 mg total) by mouth 3 (three)  times daily as needed.     albuterol (VENTOLIN HFA) 108 (90 Base) MCG/ACT inhaler Inhale 2 puffs into the lungs every 6 (six) hours as needed. 8 g 2   Ascorbic Acid (VITAMIN C) 100 MG tablet Take 100 mg by mouth daily.     aspirin EC 81 MG tablet Take 1 tablet (81 mg total) by mouth daily. Swallow whole. 30 tablet 12   baclofen 5 MG TABS Take 5-10 mg by mouth 2 (two) times daily as needed for muscle spasms (occipital pain). 30 tablet 0   BD ULTRA-FINE LANCETS lancets Use as instructed upto 6 times daily 600 each 2   Blood Glucose Monitoring Suppl (ONE TOUCH ULTRA 2) w/Device KIT Use as directed to check sugars twice daily and as needed 1 kit 0   Cholecalciferol (VITAMIN D3) 25 MCG (1000 UT) CAPS Take 1 capsule (1,000 Units total) by mouth daily. 30 capsule    clindamycin (CLEOCIN T) 1 % external solution Apply topically daily. As needed for acne 30 mL 0   clopidogrel (PLAVIX) 75 MG tablet TAKE ONE TABLET BY MOUTH EVERY EVENING 30 tablet 6   Continuous Blood Gluc Receiver (FREESTYLE LIBRE READER) DEVI For Freestyle Libre 3 - reader to use as directed to check sugars daily E13.9 1 each 0   Continuous Blood Gluc Sensor (FREESTYLE LIBRE 3 SENSOR) MISC Place 1 sensor on the skin every 14 days. Use to check glucose continuously 2 each 3  diclofenac Sodium (VOLTAREN) 1 % GEL Apply 2 g topically 3 (three) times daily. 100 g 1   Fluticasone-Umeclidin-Vilant (TRELEGY ELLIPTA) 200-62.5-25 MCG/ACT AEPB Inhale 1 puff into the lungs daily. 14 each 0   furosemide (LASIX) 20 MG tablet Take 1 tablet (20 mg total) by mouth daily. 30 tablet 5   Glucagon, rDNA, (GLUCAGON EMERGENCY) 1 MG KIT INJECT into THE muscle ONCE AS NEEDED FOR emergency 1 kit 12   Insulin Pen Needle (COMFORT EZ PEN NEEDLES) 33G X 4 MM MISC Use 4-5 times a day 300 each 3   Insulin Syringe-Needle U-100 32G X 5/16" 0.5 ML MISC Use as directed 100 each 0   losartan (COZAAR) 25 MG tablet Take 1 tablet (25 mg total) by mouth daily. 90 tablet 3    metoprolol succinate (TOPROL XL) 25 MG 24 hr tablet Take 1 tablet (25 mg total) by mouth daily. 30 tablet 5   Multiple Vitamin (MULTIVITAMIN ADULT) TABS Take 1 tablet by mouth in the morning and at bedtime.     ONETOUCH ULTRA test strip USE TO check blood glucose FOUR TIMES DAILY 400 strip 2   rosuvastatin (CRESTOR) 20 MG tablet TAKE ONE TABLET BY MOUTH EVERY EVENING 90 tablet 2   SYNTHROID 75 MCG tablet TAKE ONE TABLET BY MOUTH Monday-Saturday BEFORE breakfast 80 tablet 1   topiramate (TOPAMAX) 50 MG tablet Take 1 tablet (50 mg total) by mouth 2 (two) times daily. 60 tablet 12   insulin degludec (TRESIBA FLEXTOUCH) 100 UNIT/ML FlexTouch Pen Inject 56 Units into the skin daily. 3 mL 3   No facility-administered medications prior to visit.     Per HPI unless specifically indicated in ROS section below Review of Systems  Objective:  BP 108/60   Pulse 88   Temp 97.7 F (36.5 C) (Temporal)   Ht 5' 3" (1.6 m)   Wt 151 lb (68.5 kg)   SpO2 98%   BMI 26.75 kg/m   Wt Readings from Last 3 Encounters:  08/16/22 151 lb (68.5 kg)  08/05/22 153 lb (69.4 kg)  08/03/22 153 lb (69.4 kg)      Physical Exam Vitals and nursing note reviewed.  Constitutional:      Appearance: Normal appearance. She is not ill-appearing.  HENT:     Head: Normocephalic and atraumatic.     Mouth/Throat:     Mouth: Mucous membranes are moist.     Pharynx: Oropharynx is clear. No oropharyngeal exudate or posterior oropharyngeal erythema.  Eyes:     Extraocular Movements: Extraocular movements intact.     Conjunctiva/sclera: Conjunctivae normal.     Pupils: Pupils are equal, round, and reactive to light.  Neck:     Vascular: No hepatojugular reflux or JVD.  Cardiovascular:     Rate and Rhythm: Normal rate and regular rhythm.     Pulses: Normal pulses.     Heart sounds: Normal heart sounds. No murmur heard. Pulmonary:     Effort: Pulmonary effort is normal. No respiratory distress.     Breath sounds: Normal  breath sounds. No wheezing, rhonchi or rales.  Chest:     Chest wall: Tenderness present.  Abdominal:     General: Bowel sounds are normal. There is no distension.     Palpations: Abdomen is soft. There is no mass.     Tenderness: There is abdominal tenderness (moderate) in the right upper quadrant. There is no guarding or rebound. Negative signs include Murphy's sign.     Hernia: No hernia is  present.     Comments: Discomfort to palpation at RUQ to liver/gallbladder > inferior ribcage  Musculoskeletal:     Cervical back: Normal range of motion.     Right lower leg: No edema.     Left lower leg: No edema.  Skin:    General: Skin is warm and dry.     Findings: No rash.  Neurological:     Mental Status: She is alert.  Psychiatric:        Mood and Affect: Mood normal.        Behavior: Behavior normal.       Results for orders placed or performed in visit on 08/16/22  CBC with Differential/Platelet  Result Value Ref Range   WBC 8.4 4.0 - 10.5 K/uL   RBC 5.12 (H) 3.87 - 5.11 Mil/uL   Hemoglobin 14.2 12.0 - 15.0 g/dL   HCT 42.6 36.0 - 46.0 %   MCV 83.2 78.0 - 100.0 fl   MCHC 33.3 30.0 - 36.0 g/dL   RDW 14.3 11.5 - 15.5 %   Platelets 241.0 150.0 - 400.0 K/uL   Neutrophils Relative % 73.8 43.0 - 77.0 %   Lymphocytes Relative 18.3 12.0 - 46.0 %   Monocytes Relative 6.4 3.0 - 12.0 %   Eosinophils Relative 0.5 0.0 - 5.0 %   Basophils Relative 1.0 0.0 - 3.0 %   Neutro Abs 6.2 1.4 - 7.7 K/uL   Lymphs Abs 1.5 0.7 - 4.0 K/uL   Monocytes Absolute 0.5 0.1 - 1.0 K/uL   Eosinophils Absolute 0.0 0.0 - 0.7 K/uL   Basophils Absolute 0.1 0.0 - 0.1 K/uL  Comprehensive metabolic panel  Result Value Ref Range   Sodium 142 135 - 145 mEq/L   Potassium 4.2 3.5 - 5.1 mEq/L   Chloride 105 96 - 112 mEq/L   CO2 31 19 - 32 mEq/L   Glucose, Bld 139 (H) 70 - 99 mg/dL   BUN 15 6 - 23 mg/dL   Creatinine, Ser 0.95 0.40 - 1.20 mg/dL   Total Bilirubin 0.4 0.2 - 1.2 mg/dL   Alkaline Phosphatase 83 39 -  117 U/L   AST 19 0 - 37 U/L   ALT 19 0 - 35 U/L   Total Protein 6.8 6.0 - 8.3 g/dL   Albumin 4.4 3.5 - 5.2 g/dL   GFR 56.73 (L) >60.00 mL/min   Calcium 9.7 8.4 - 10.5 mg/dL  Brain natriuretic peptide  Result Value Ref Range   Pro B Natriuretic peptide (BNP) 46.0 0.0 - 100.0 pg/mL   Lab Results  Component Value Date   TSH 1.33 10/15/2021    Assessment & Plan:   Problem List Items Addressed This Visit       Unprioritized   Chest pain    Right chest discomfort as well as right thoracic back discomfort and RUQ abd discomfort on exam. She completed reassuring cardiac CTA with calcium score of zero (10/2021).  ?fibromyalgia related.  Check CXR.       Relevant Orders   MR Thoracic Spine Wo Contrast   Chronic right-sided headache    Headaches resolved off insulin aspart.       Chronic midline thoracic back pain    Has had reassuring cardiac and pulmonary evaluation - consider thoracic MRI for further evaluation given ongoing pain.       Relevant Orders   MR Thoracic Spine Wo Contrast   Right sided abdominal pain    Discomfort to palpation at RUQ.  Does  not describe typical symptoms of biliary colic.  Check CBC, CMP today, consider further evaluation with RUQ imaging if ongoing abdominal discomfort.       Relevant Orders   CBC with Differential/Platelet (Completed)   Comprehensive metabolic panel (Completed)   Chronic dyspnea - Primary    Chronic, worse yesterday but today feeling some better. Normal ambulatory pulse ox today, maintaining O2 sats at 95% throughout 2 minute walk.  Reviewed latest reassuring echocardiogram 11/2021 with EF 55-60%, G1DD with only mild MR/AR. No significant murmur appreciated on exam today so doubt valvular disease progression.  Will update CXR and labs today including BNP.  Overall unclear cause of ongoing dyspnea, despite undergoing cardiac and pulmonary evaluations.       Relevant Orders   Brain natriuretic peptide (Completed)   DG Chest  2 View     No orders of the defined types were placed in this encounter.  Orders Placed This Encounter  Procedures   DG Chest 2 View    Standing Status:   Future    Number of Occurrences:   1    Standing Expiration Date:   08/17/2023    Order Specific Question:   Reason for Exam (SYMPTOM  OR DIAGNOSIS REQUIRED)    Answer:   dyspea    Order Specific Question:   Preferred imaging location?    Answer:   [facility]   MR Thoracic Spine Wo Contrast    Standing Status:   Future    Standing Expiration Date:   08/21/2023    Order Specific Question:   What is the patient's sedation requirement?    Answer:   No Sedation    Order Specific Question:   Does the patient have a pacemaker or implanted devices?    Answer:   No    Order Specific Question:   Preferred imaging location?    Answer:   [facility]   CBC with Differential/Platelet   Comprehensive metabolic panel   Brain natriuretic peptide     Patient Instructions  Ambulatory pulse ox today.  Chest xray today  Labs today  We will be in touch with results.   Follow up plan: Return if symptoms worsen or fail to improve.  [name], MD

## 2022-08-16 NOTE — Telephone Encounter (Signed)
Please offer appt at 1pm today for further evaluation of chest discomfort.

## 2022-08-16 NOTE — Assessment & Plan Note (Addendum)
Chronic, worse yesterday but today feeling some better. Normal ambulatory pulse ox today, maintaining O2 sats at 95% throughout 2 minute walk.  Reviewed latest reassuring echocardiogram 11/2021 with EF 55-60%, G1DD with only mild MR/AR. No significant murmur appreciated on exam today so doubt valvular disease progression.  Will update CXR and labs today including BNP.  Overall unclear cause of ongoing dyspnea, despite undergoing cardiac and pulmonary evaluations.

## 2022-08-16 NOTE — Patient Instructions (Addendum)
Ambulatory pulse ox today.  Chest xray today  Labs today  We will be in touch with results.

## 2022-08-17 ENCOUNTER — Other Ambulatory Visit: Payer: Self-pay | Admitting: Family Medicine

## 2022-08-17 DIAGNOSIS — E1169 Type 2 diabetes mellitus with other specified complication: Secondary | ICD-10-CM

## 2022-08-17 NOTE — Telephone Encounter (Addendum)
Refill request Cassandra Benson Last refill 07/20/22  3 ml/3 refills Last office visit  08/16/22 Spoke to pharmacist and was advised when refill sent in last it was only for a 27 day supply. Pharmacist stated that they would need 15 ml per fill. See allergy/contraindication

## 2022-08-17 NOTE — Telephone Encounter (Signed)
ERx 

## 2022-08-20 NOTE — Assessment & Plan Note (Signed)
Headaches resolved off insulin aspart.

## 2022-08-20 NOTE — Assessment & Plan Note (Signed)
Discomfort to palpation at RUQ.  Does not describe typical symptoms of biliary colic.  Check CBC, CMP today, consider further evaluation with RUQ imaging if ongoing abdominal discomfort.

## 2022-08-20 NOTE — Assessment & Plan Note (Signed)
Right chest discomfort as well as right thoracic back discomfort and RUQ abd discomfort on exam. She completed reassuring cardiac CTA with calcium score of zero (10/2021).  ?fibromyalgia related.  Check CXR.

## 2022-08-20 NOTE — Assessment & Plan Note (Signed)
Has had reassuring cardiac and pulmonary evaluation - consider thoracic MRI for further evaluation given ongoing pain.

## 2022-08-22 ENCOUNTER — Telehealth: Payer: Self-pay | Admitting: Pulmonary Disease

## 2022-08-22 ENCOUNTER — Ambulatory Visit: Payer: HMO | Admitting: Diagnostic Neuroimaging

## 2022-08-22 ENCOUNTER — Encounter: Payer: Self-pay | Admitting: *Deleted

## 2022-08-22 NOTE — Telephone Encounter (Signed)
I called and explained to the patient that Dr. Patsey Berthold placed order for home sleep test and when I get to her on my list I would be calling her to pick up the machine and she would be doing the test in her own home. I tried to answer any other questions that she might have. I told her it would be another week before we would get her scheduled to pick up the HST machine

## 2022-08-25 LAB — HM DIABETES EYE EXAM

## 2022-08-29 ENCOUNTER — Other Ambulatory Visit: Payer: Self-pay | Admitting: Family Medicine

## 2022-08-29 DIAGNOSIS — E139 Other specified diabetes mellitus without complications: Secondary | ICD-10-CM

## 2022-08-30 ENCOUNTER — Encounter: Payer: Self-pay | Admitting: Family Medicine

## 2022-08-31 ENCOUNTER — Encounter: Payer: Self-pay | Admitting: Family Medicine

## 2022-08-31 ENCOUNTER — Ambulatory Visit
Admission: RE | Admit: 2022-08-31 | Discharge: 2022-08-31 | Disposition: A | Discharge status: Home / Self Care | Payer: HMO | Source: Ambulatory Visit | Attending: Family Medicine | Admitting: Family Medicine

## 2022-08-31 DIAGNOSIS — G8929 Other chronic pain: Secondary | ICD-10-CM

## 2022-08-31 DIAGNOSIS — R079 Chest pain, unspecified: Secondary | ICD-10-CM

## 2022-09-02 ENCOUNTER — Other Ambulatory Visit: Payer: Self-pay | Admitting: Family Medicine

## 2022-09-02 DIAGNOSIS — G8929 Other chronic pain: Secondary | ICD-10-CM

## 2022-09-05 ENCOUNTER — Telehealth: Payer: Self-pay | Admitting: Family Medicine

## 2022-09-05 ENCOUNTER — Encounter: Payer: Self-pay | Admitting: *Deleted

## 2022-09-05 NOTE — Telephone Encounter (Signed)
ATC pt x 3, line busy.

## 2022-09-05 NOTE — Telephone Encounter (Signed)
Spoke with patient regarding her referral. She had questions about who she is being referred to. I gave her the information again for Dr Sharlet Salina and where he is located. Explained what he does and the fact that he can prescribe pain medications where traditional PT and Therapists cannot.   Pt is wanting to discuss her labs and diagnosis further with Dr Danise Mina and/or Lattie Haw.

## 2022-09-05 NOTE — Telephone Encounter (Signed)
Patient called and to speak with the referral coordinator about some labs test on the referral. Call back number [phone].

## 2022-09-06 NOTE — Telephone Encounter (Signed)
Rtn pt's call. Pt states she cannot get in with Dr. Sharlet Salina until 12/2022. She is asking what else she can take [besides Tylenol, which is not helping] until she can see him.  Also, pt wants to talk with Dr. Darnell Level directly about her dx and has some specific questions.

## 2022-09-07 NOTE — Telephone Encounter (Addendum)
Spoke with patient.  Discussed her concerns. I had to end conversation early as I have to leave building as they're starting disinfecting/cleaning process now.  She was appreciative of call.

## 2022-09-08 ENCOUNTER — Other Ambulatory Visit: Payer: Self-pay | Admitting: Family Medicine

## 2022-09-08 ENCOUNTER — Other Ambulatory Visit: Payer: Self-pay | Admitting: Cardiovascular Disease

## 2022-09-08 DIAGNOSIS — E038 Other specified hypothyroidism: Secondary | ICD-10-CM

## 2022-09-09 NOTE — Telephone Encounter (Signed)
I already spoke with patient earlier in the week.

## 2022-09-09 NOTE — Telephone Encounter (Signed)
See message f rom patient

## 2022-09-13 ENCOUNTER — Telehealth: Payer: Self-pay | Admitting: Diagnostic Neuroimaging

## 2022-09-13 NOTE — Telephone Encounter (Signed)
Agree, she wouldd need to call Dr [name], MD  office to review the results and discuss. No appt here is needed

## 2022-09-13 NOTE — Telephone Encounter (Signed)
Contacted pt back, reiterated to her that she will need to call Dr [name], MD  office to review the results and discuss as it looks like he has a plan to FU with. She verbally understood and was appreciative. Apologized for the confusion, advised her it is ok, was No problem at all.

## 2022-09-13 NOTE — Telephone Encounter (Signed)
Patient called in to ask about getting an appointment scheduled regarding the results from her recent MRI of thoracic spine. Stated she received abnormal results and would like to see someone about it. I reviewed patient chart and it looks like PCP Dr. 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 I wonder if this could be contributing to her chronic right sided thoracic back pain and would like her to see a physical medicine and rehab doctor. I'd like her to see Dr Sharlet Salina - I placed a referral back in 05/2022 - did she ever see PM&R? New referral placed. "  She is scheduled to see them on 10/20/22. I advised patient I was not sure if what is showing on the MRI is a neurological issue that we would see her here for and patient advised she just would like someone to review the results and get some more answers. I advised that dr. Danise Mina referred her to Physical Medicine and Rehab for this evaluation but she is very insistent and possibly a bit confused wanting to schedule an appointment here. I advised I would send in a message to see if this is anything we would even see her here for. Please advise on how to proceed, thank you

## 2022-09-14 NOTE — Telephone Encounter (Addendum)
Attempted to submit new PAvia CMM Key: [id number]. There was an error with your request. Eligibility could not be verified for this patient - patient not found. Please review patient information

## 2022-09-15 ENCOUNTER — Encounter: Payer: Self-pay | Admitting: Pulmonary Disease

## 2022-09-15 ENCOUNTER — Ambulatory Visit: Payer: HMO | Admitting: Pulmonary Disease

## 2022-09-15 VITALS — BP 110/70 | HR 76 | Temp 97.6°F | Ht 63.0 in | Wt 154.4 lb

## 2022-09-15 DIAGNOSIS — G478 Other sleep disorders: Secondary | ICD-10-CM | POA: Diagnosis not present

## 2022-09-15 DIAGNOSIS — R0602 Shortness of breath: Secondary | ICD-10-CM | POA: Diagnosis not present

## 2022-09-15 DIAGNOSIS — J4530 Mild persistent asthma, uncomplicated: Secondary | ICD-10-CM

## 2022-09-15 LAB — NITRIC OXIDE: Nitric Oxide: 25

## 2022-09-15 NOTE — Progress Notes (Signed)
Subjective:    Patient ID: Cassandra Benson, female    DOB: Feb 16, 1941, 81 y.o.   MRN: [id number] Patient Care Team: Eustaquio Boyden, MD as PCP - General (Family Medicine) Antonieta Iba, MD as PCP - Cardiology (Cardiology) Galen Manila, MD as Referring Physician (Ophthalmology) Kathyrn Sheriff, [facility] as Pharmacist (Pharmacist)  Chief Complaint  Patient presents with   Follow-up    Same as before. SOB and wheezing constant. Dry cough.   HPI The patient is a 81 year old lifelong never smoker with a very complex medical history as noted below, who presents for follow-up on the issue of dyspnea she was last seen on 01 August 2022.  At that time she had a home sleep test ordered which will be performed on 27 December, she also had connective tissue disease panel ordered which was negative.  She was initially evaluated here on 07 April 2022, for the details of that visit please refer to that note.  She states that the issues were not triggered by any illness preceding the symptoms he just started noticing that without rhyme or reason her "breath would give out".  Previously she had noted a varying tone to her voice with her voice at times going from high pitch to aphonic.  He was started on Trelegy after her initial visit here and she has noted that this has helped her symptoms somewhat initially but now less effective. She gives out easily due to fatigue and shortness of breath while doing activities of daily living or while walking.  She notes that her grandchildren have to help her make her bed due to dyspnea.  She used to go on daily walks with friends but now avoids this due to her breathlessness and easy fatigability.  She has not had nocturnal awakenings with the shortness of breath.  No orthopnea or paroxysmal nocturnal dyspnea. She does notice tachypalpitations.  No diaphoresis during these episodes.  She has not had any cough of note.  No hemoptysis.  Has occasional dysphagia  mostly to solids but no gastroesophageal reflux symptoms.  No fevers, chills or sweats.  She has significant issues with arthralgias and myalgias, no joint swelling per se.  Nothing helps her symptoms.  Any activity aggravates symptoms.  Since starting on Trelegy her dyspnea has not improved significantly.  She does have a mild asthma component on her PFTs.   PFTs have been essentially benign.  We have not been able to demonstrate any oxygen desaturations with exercise.  Her dyspnea is out of proportion to the minimal findings on PFTs.   DATA 12/24/2021 echocardiogram: LVEF 60 to 65%, grade 1 DD, normal right ventricular systolic function.  Normal pulmonary artery systolic pressure.  Mild MR. 04/11/2022 CT chest high-resolution: Mild pulmonary fibrosis pattern with apical to basal gradient, no significant air trapping on expiratory phase.  Not consistent with UIP.  Findings are very mild. 04/22/2022 PFTs: FEV1 1.91 L or 101% predicted, FVC 2.98 L or 117% predicted, FEV1/FVC 64%.  No bronchodilator response, lung volumes normal.  Mild diffusion capacity impairment.  Consistent with mild obstructive airways disease.  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 08/01/2022 connective tissue disease panel, acetylcholine receptor assay: Negative.  Review of Systems A 10 point review of systems was performed and it is as noted above otherwise negative.  Patient Active Problem List   Diagnosis Date Noted   Allergy to beta blocker 08/06/2022   Steroid-induced hyperglycemia 05/16/2022   Right thigh pain 05/16/2022   Dysuria 02/25/2022  Body aches 02/15/2022   Insomnia 02/01/2022   Osteopenia 01/15/2022   Chronic dyspnea 12/22/2021   Unsteadiness 02/13/2021   Atherosclerosis of aorta (HCC) 12/16/2020   Neck pain 12/03/2020   Fall with injury 09/01/2020   Grade II diastolic dysfunction 08/01/2020   Chronic venous insufficiency 04/07/2020   Bowel habit changes 02/18/2020   Partial small bowel  obstruction (HCC) 01/15/2020   Epistaxis 01/13/2020   Cognitive changes 10/22/2019   Right sided abdominal pain 10/07/2019   Skin lesion 09/14/2019   Renal insufficiency 09/14/2019   Pain in right buttock 06/18/2019   Chronic midline thoracic back pain 05/14/2019   Sjogren's syndrome without extraglandular involvement (HCC) 02/26/2019   Epigastric discomfort 12/17/2018   PAD (peripheral artery disease) (HCC) 10/04/2018   Raynaud disease 09/04/2018   Amaurosis fugax, both eyes 06/18/2018   TIA (transient ischemic attack) 05/21/2018   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   Facial paresthesia 02/07/2018   Pain and swelling of lower leg 12/25/2017   Fibromyalgia    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), bilateral 08/21/2017   Chronic right-sided headache 03/13/2017   Numbness and tingling of both lower extremities 03/13/2017   6th nerve palsy, 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   Fatty liver 02/13/2017   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) 10/03/2016   Acute pain of right shoulder 08/15/2016   Medicare annual wellness visit, subsequent 06/19/2015   Health maintenance examination 06/19/2015   Advanced care planning/counseling discussion 06/19/2015   Carotid stenosis, 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 06/19/2015   Vitamin D deficiency    Family history of premature CAD 06/02/2015   Chest pain 05/26/2015   Elevated testosterone level in female 09/04/2014   Hyperlipidemia associated with type 2 diabetes mellitus (HCC) 07/30/2014   Essential hypertension 07/17/2014   Hypothyroidism due to Hashimoto's thyroiditis 07/17/2014   Apnea, sleep 08/22/2012   Chronic low back pain 02/27/2012   Positive ANA (antinuclear antibody) 01/06/2012   Social History   Tobacco Use   Smoking status: Never   Smokeless tobacco: Never  Substance Use Topics   Alcohol use: No   Allergies  Allergen Reactions   Iodinated Contrast Media Other (See Comments)    Itching  (severe) and chest tightness   Penicillins Anaphylaxis, Swelling, Rash and Other (See Comments)    Has patient had a PCN reaction causing immediate rash, facial/tongue/throat swelling, SOB or lightheadedness with hypotension: Yes Has patient had a PCN reaction causing severe rash involving mucus membranes or skin necrosis: yes - remotely Has patient had a PCN reaction that required hospitalization: occurred while hospitalized Has patient had a PCN reaction occurring within the last 10 years: No If all of the above answers are "NO", then may proceed with Cephalosporin use.    Amlodipine Swelling    Pedal edema   Anectine [Succinylcholine] Other (See Comments)    Brother went into cardiac arrest.   Carvedilol Dermatitis    Facial acne   Codeine Nausea Only   Gabapentin Other (See Comments)    Gait abnormality   Influenza Vaccines Other (See Comments)    Muscle weakness; unable to walk   Insulin Aspart Other (See Comments)    headache   Nortriptyline Other (See Comments)    Eye swelling and mouth drawed up   Pamelor [Nortriptyline Hcl] Other (See Comments)    Patient states caused her face to draw in together.   Prednisone     Marked severe hyperglycemia to oral and IM steroids   Valsartan Other (See Comments)  and Cough    Allergy to generic only, "Hacking" cough   Zetia [Ezetimibe] Other (See Comments)    Bad muscle cramps   Erythromycin Rash and Swelling   Sulfa Antibiotics Rash   Current Meds  Medication Sig   acetaminophen (TYLENOL) 500 MG tablet Take 1 tablet (500 mg total) by mouth 3 (three) times daily as needed.   albuterol (VENTOLIN HFA) 108 (90 Base) MCG/ACT inhaler Inhale 2 puffs into the lungs every 6 (six) hours as needed.   Ascorbic Acid (VITAMIN C) 100 MG tablet Take 100 mg by mouth daily.   aspirin EC 81 MG tablet Take 1 tablet (81 mg total) by mouth daily. Swallow whole.   baclofen 5 MG TABS Take 5-10 mg by mouth 2 (two) times daily as needed for muscle spasms  (occipital pain).   BD ULTRA-FINE LANCETS lancets Use as instructed upto 6 times daily   Blood Glucose Monitoring Suppl (ONE TOUCH ULTRA 2) w/Device KIT Use as directed to check sugars twice daily and as needed   Cholecalciferol (VITAMIN D3) 25 MCG (1000 UT) CAPS Take 1 capsule (1,000 Units total) by mouth daily.   clindamycin (CLEOCIN T) 1 % external solution Apply topically daily. As needed for acne   clopidogrel (PLAVIX) 75 MG tablet TAKE ONE TABLET BY MOUTH EVERY EVENING   Continuous Blood Gluc Sensor (FREESTYLE LIBRE 3 SENSOR) MISC Place 1 sensor on the skin every 14 days. Use to check glucose continuously   Continuous Blood Gluc Sensor (FREESTYLE LIBRE 3 SENSOR) MISC USE TO CHECK BLOOD SUGARS DAILY   diclofenac Sodium (VOLTAREN) 1 % GEL Apply 2 g topically 3 (three) times daily.   Fluticasone-Umeclidin-Vilant (TRELEGY ELLIPTA) 200-62.5-25 MCG/ACT AEPB Inhale 1 puff into the lungs daily.   furosemide (LASIX) 20 MG tablet TAKE ONE TABLET BY MOUTH ONCE DAILY   Glucagon, rDNA, (GLUCAGON EMERGENCY) 1 MG KIT INJECT into THE muscle ONCE AS NEEDED FOR emergency   insulin degludec (TRESIBA FLEXTOUCH) 100 UNIT/ML FlexTouch Pen Inject 56 Units into the skin daily.   Insulin Pen Needle (COMFORT EZ PEN NEEDLES) 33G X 4 MM MISC Use 4-5 times a day   Insulin Syringe-Needle U-100 32G X 5/16" 0.5 ML MISC Use as directed   losartan (COZAAR) 25 MG tablet Take 1 tablet (25 mg total) by mouth daily.   metoprolol succinate (TOPROL XL) 25 MG 24 hr tablet Take 1 tablet (25 mg total) by mouth daily.   Multiple Vitamin (MULTIVITAMIN ADULT) TABS Take 1 tablet by mouth in the morning and at bedtime.   ONETOUCH ULTRA test strip USE TO check blood glucose FOUR TIMES DAILY   rosuvastatin (CRESTOR) 20 MG tablet TAKE ONE TABLET BY MOUTH EVERY EVENING   SYNTHROID 75 MCG tablet TAKE ONE TABLET BY MOUTH Monday-Saturday BEFORE breakfast   topiramate (TOPAMAX) 50 MG tablet Take 1 tablet (50 mg total) by mouth 2 (two) times  daily.   Immunization History  Administered Date(s) Administered   Pneumococcal Conjugate-13 09/23/2014   Pneumococcal Polysaccharide-23 01/05/2010, 07/17/2013   Tdap 10/07/2011       Objective:   Physical Exam BP 110/70 (BP Location: Left Arm, Cuff Size: Normal)   Pulse 76   Temp 97.6 F (36.4 C)   Ht 5\' 3"  (1.6 m)   Wt 154 lb 6.4 oz (70 kg)   SpO2 99%   BMI 27.35 kg/m   SpO2: 99 % O2 Device: None (Room air)  GENERAL: Well-developed, well-nourished woman, no acute distress.  Fully ambulatory mild conversational dyspnea.  Voice changes during interview. Occasionally aphonic. HEAD: Normocephalic, atraumatic.  EYES: Pupils equal, round, reactive to light.  No scleral icterus.  MOUTH: Oral mucosa moist.  No thrush. NECK: Supple. No thyromegaly. Trachea midline. No JVD.  No adenopathy. PULMONARY: Good air entry bilaterally.  No adventitious sounds. CARDIOVASCULAR: S1 and S2. Regular rate and rhythm.  No rubs, murmurs or gallops heard. ABDOMEN: Protuberant, otherwise benign. MUSCULOSKELETAL: No joint deformity, no clubbing, no edema.  NEUROLOGIC: No overt focal deficit, no gait disturbance, speech is fluent. SKIN: Intact,warm,dry.  No overt rashes, exam limited. PSYCH: Anxious,tearful.  Ambulatory oximetry was performed today: At rest on room air oxygen saturation was 99%, the patient ambulated at a moderate pace, completed 2 laps, O2 nadir 99%, severe shortness of breath.  Resting heart rate was 75 bpm at maximum for this exercise 85 bpm.  No evidence of oxygen desaturations with ambulation.  FeNO today: 25 ppb  Recent Results (from the past 2160 hour(s))  POCT glycosylated hemoglobin (Hb A1C)     Status: Abnormal   Collection Time: 06/22/22 11:26 AM  Result Value Ref Range   Hemoglobin A1C 8.8 (A) 4.0 - 5.6 %   HbA1c POC (<> result, manual entry)     HbA1c, POC (prediabetic range)     HbA1c, POC (controlled diabetic range)    Nitric oxide     Status: None   Collection  Time: 08/01/22 10:27 AM  Result Value Ref Range   Nitric Oxide 31   Acetylcholine receptor, blocking     Status: None   Collection Time: 08/01/22 11:59 AM  Result Value Ref Range   Acetylchol Block Ab 18 0 - 25 %    Comment: (NOTE) **Results verified by repeat testing** This test was developed and its performance characteristics determined by [facility]. It has not been cleared or approved by the Food and Drug Administration.                               Negative:      0 - 25                               Borderline:   26 - 30                               Positive:         >30 Performed At: [facility]  [address] [place], [place] [id number] Jolene Schimke MD WU:[id number]   Sedimentation rate     Status: Abnormal   Collection Time: 08/01/22 11:59 AM  Result Value Ref Range   Sed Rate 33 (H) 0 - 30 mm/hr    Comment: Performed at [facility], [address] [place], [place] [address]  C-reactive protein     Status: None   Collection Time: 08/01/22 11:59 AM  Result Value Ref Range   CRP 0.6 <1.0 mg/dL    Comment: Performed at [facility], [address], [place], [place] [address]  Rheumatoid factor     Status: None   Collection Time: 08/01/22 11:59 AM  Result Value Ref Range   Rhuematoid fact SerPl-aCnc 13.1 <14.0 IU/mL    Comment: (NOTE) Performed At: [facility] [address] [place], [place] [id number] Jolene Schimke MD MW:[id number]   Miscellaneous [facility] test (send-out)  Status: None   Collection Time: 08/01/22 11:59 AM  Result Value Ref Range   [facility] test code [id number]    [facility] test name ANA AND ENA     Comment: Performed at [facility], [address] [place], [place] [address]   Misc [facility] result COMMENT     Comment: (NOTE) Test Ordered: [id number] ANA+ENA+DNA/DS+Scl 70+SjoSSA/B ANA by IFA Rfx Titer/Pattern   Negative                  BN                                       Negative   <1:80                                      Borderline  1:80                                     Positive   >1:80 ICAP nomenclature: AC-0 For more information about Hep-2 cell patterns use ANApatterns.org, the official website for the International Consensus on Antinuclear Antibody (ANA) Patterns (ICAP). Anti-DNA (DS) Ab Qn            <1               IU/mL    BN     Reference Range: 0-9                                                                     Negative      <5                                   Equivocal  5 - 9                                   Positive      >9 RNP Antibodies                 <0.2             AI       BN     Reference Range: 0.0-0.9                               Smith Antibodies               <0.2             AI       BN     Reference Range: 0.0-0.9                                Antiscleroderma-70 Antibodies  <0.2  AI       BN     Reference Range: 0.0-0.9                               Sjogren's Anti-SS-A            0.3              AI       BN     Reference Range: 0.0-0.9                               Sjogren's Anti-SS-B            <0.2             AI       BN     Reference Range: 0.0-0.9                               Performed At: [facility] [place] [address] [place], [place] [id number] Jolene Schimke MD WU:[id number]   CBC with Differential/Platelet     Status: Abnormal   Collection Time: 08/16/22  1:35 PM  Result Value Ref Range   WBC 8.4 4.0 - 10.5 K/uL   RBC 5.12 (H) 3.87 - 5.11 Mil/uL   Hemoglobin 14.2 12.0 - 15.0 g/dL   HCT 95.6 21.3 - 08.6 %   MCV 83.2 78.0 - 100.0 fl   MCHC 33.3 30.0 - 36.0 g/dL   RDW 57.8 46.9 - 62.9 %   Platelets 241.0 150.0 - 400.0 K/uL   Neutrophils Relative % 73.8 43.0 - 77.0 %   Lymphocytes Relative 18.3 12.0 - 46.0 %   Monocytes Relative 6.4 3.0 - 12.0 %   Eosinophils Relative 0.5 0.0 - 5.0 %   Basophils Relative 1.0 0.0 - 3.0 %   Neutro Abs 6.2 1.4 - 7.7 K/uL   Lymphs Abs 1.5 0.7 - 4.0 K/uL   Monocytes Absolute 0.5 0.1  - 1.0 K/uL   Eosinophils Absolute 0.0 0.0 - 0.7 K/uL   Basophils Absolute 0.1 0.0 - 0.1 K/uL  Comprehensive metabolic panel     Status: Abnormal   Collection Time: 08/16/22  1:35 PM  Result Value Ref Range   Sodium 142 135 - 145 mEq/L   Potassium 4.2 3.5 - 5.1 mEq/L   Chloride 105 96 - 112 mEq/L   CO2 31 19 - 32 mEq/L   Glucose, Bld 139 (H) 70 - 99 mg/dL   BUN 15 6 - 23 mg/dL   Creatinine, Ser 5.28 0.40 - 1.20 mg/dL   Total Bilirubin 0.4 0.2 - 1.2 mg/dL   Alkaline Phosphatase 83 39 - 117 U/L   AST 19 0 - 37 U/L   ALT 19 0 - 35 U/L   Total Protein 6.8 6.0 - 8.3 g/dL   Albumin 4.4 3.5 - 5.2 g/dL   GFR 41.32 (L) >44.01 mL/min    Comment: Calculated using the CKD-EPI Creatinine Equation (2021)   Calcium 9.7 8.4 - 10.5 mg/dL  Brain natriuretic peptide     Status: None   Collection Time: 08/16/22  1:35 PM  Result Value Ref Range   Pro B Natriuretic peptide (BNP) 46.0 0.0 - 100.0 pg/mL  HM DIABETES EYE EXAM     Status: Abnormal   Collection Time: 08/25/22 12:00 AM  Result Value Ref  Range   HM Diabetic Eye Exam Retinopathy (A) No Retinopathy    Comment: Repeat in 1 year       Assessment & Plan:     ICD-10-CM   1. SOB (shortness of breath)  R06.02 Nitric oxide   Out of proportion to findings on PFT No evidence of desaturations with ambulation Very minimal asthma component Continue Trelegy for now    2. Mild persistent asthma without complication  J45.30    Very mild Continue Trelegy for now    3. Non-restorative sleep  G47.8    To have sleep study 12/27     Will see the patient in follow-up in 2 months time call sooner should any new problems arise.   Gailen Shelter, MD Advanced Bronchoscopy PCCM [name]    *This note was dictated using voice recognition software/Dragon.  Despite best efforts to proofread, errors can occur which can change the meaning. Any transcriptional errors that result from this process are unintentional and may not be  fully corrected at the time of dictation.

## 2022-09-15 NOTE — Patient Instructions (Signed)
Your walking test was good today you kept your oxygen level at 99%.  Lets continue the Trelegy for now.  Your airway inflammation is indeed coming down.  This was very mild.  If you feel short of breath in the evening you can use your albuterol inhaler.  You have a sleep study test upcoming on 27 December and I think that this will be very revealing.  Next studies to be ordered would be a cardiopulmonary stress test however I need to make sure that you can tolerate that from your spine standpoint so it would be best to wait until you get evaluated by [facility] to determine if you still need this test this test would require that you walk on a treadmill.  I do not think that your shortness of breath is coming from defect in your lungs.  We will see you in follow-up in 2 months time call sooner should any new problems arise.

## 2022-09-21 ENCOUNTER — Ambulatory Visit: Payer: HMO

## 2022-09-21 DIAGNOSIS — G4733 Obstructive sleep apnea (adult) (pediatric): Secondary | ICD-10-CM

## 2022-09-21 DIAGNOSIS — R0602 Shortness of breath: Secondary | ICD-10-CM

## 2022-09-27 ENCOUNTER — Ambulatory Visit (INDEPENDENT_AMBULATORY_CARE_PROVIDER_SITE_OTHER): Payer: HMO

## 2022-09-27 VITALS — Ht 63.0 in | Wt 154.0 lb

## 2022-09-27 DIAGNOSIS — Z0000 Encounter for general adult medical examination without abnormal findings: Secondary | ICD-10-CM | POA: Diagnosis not present

## 2022-09-27 NOTE — Patient Instructions (Addendum)
[name] , Thank you for taking time to come for your Medicare Wellness Visit. I appreciate your ongoing commitment to your health goals. Please review the following plan we discussed and let me know if I can assist you in the future.   These are the goals we discussed:  Goals Addressed             This Visit's Progress    Maintain healthy lifestyle       Stay active Healthy diet Good water intake         This is a list of the screening recommended for you and due dates:  Health Maintenance  Topic Date Due   Yearly kidney health urinalysis for diabetes  01/10/2017   DTaP/Tdap/Td vaccine (2 - Td or Tdap) 10/06/2021   Zoster (Shingles) Vaccine (1 of 2) 12/27/2022*   COVID-19 Vaccine (1) 09/25/2024*   Hemoglobin A1C  12/21/2022   Mammogram  03/09/2023   Complete foot exam   08/06/2023   Yearly kidney function blood test for diabetes  08/17/2023   Eye exam for diabetics  08/26/2023   Medicare Annual Wellness Visit  09/28/2023   Pneumonia Vaccine  Completed   DEXA scan (bone density measurement)  Completed   HPV Vaccine  Aged Out  *Topic was postponed. The date shown is not the original due date.    Advanced directives: on file  Conditions/risks identified: none new  Next appointment: Follow up in one year for your annual wellness visit    Preventive Care 65 Years and Older, Female Preventive care refers to lifestyle choices and visits with your health care provider that can promote health and wellness. What does preventive care include? A yearly physical exam. This is also called an annual well check. Dental exams once or twice a year. Routine eye exams. Ask your health care provider how often you should have your eyes checked. Personal lifestyle choices, including: Daily care of your teeth and gums. Regular physical activity. Eating a healthy diet. Avoiding tobacco and drug use. Limiting alcohol use. Practicing safe sex. Taking low-dose aspirin every  day. Taking vitamin and mineral supplements as recommended by your health care provider. What happens during an annual well check? The services and screenings done by your health care provider during your annual well check will depend on your age, overall health, lifestyle risk factors, and family history of disease. Counseling  Your health care provider may ask you questions about your: Alcohol use. Tobacco use. Drug use. Emotional well-being. Home and relationship well-being. Sexual activity. Eating habits. History of falls. Memory and ability to understand (cognition). Work and work Statistician. Reproductive health. Screening  You may have the following tests or measurements: Height, weight, and BMI. Blood pressure. Lipid and cholesterol levels. These may be checked every 5 years, or more frequently if you are over 63 years old. Skin check. Lung cancer screening. You may have this screening every year starting at age 30 if you have a 30-pack-year history of smoking and currently smoke or have quit within the past 15 years. Fecal occult blood test (FOBT) of the stool. You may have this test every year starting at age 60. Flexible sigmoidoscopy or colonoscopy. You may have a sigmoidoscopy every 5 years or a colonoscopy every 10 years starting at age 66. Hepatitis C blood test. Hepatitis B blood test. Sexually transmitted disease (STD) testing. Diabetes screening. This is done by checking your blood sugar (glucose) after you have not eaten for a while (fasting). You  may have this done every 1-3 years. Bone density scan. This is done to screen for osteoporosis. You may have this done starting at age 6. Mammogram. This may be done every 1-2 years. Talk to your health care provider about how often you should have regular mammograms. Talk with your health care provider about your test results, treatment options, and if necessary, the need for more tests. Vaccines  Your health care  provider may recommend certain vaccines, such as: Influenza vaccine. This is recommended every year. Tetanus, diphtheria, and acellular pertussis (Tdap, Td) vaccine. You may need a Td booster every 10 years. Zoster vaccine. You may need this after age 23. Pneumococcal 13-valent conjugate (PCV13) vaccine. One dose is recommended after age 41. Pneumococcal polysaccharide (PPSV23) vaccine. One dose is recommended after age 51. Talk to your health care provider about which screenings and vaccines you need and how often you need them. This information is not intended to replace advice given to you by your health care provider. Make sure you discuss any questions you have with your health care provider. Document Released: 10/09/2015 Document Revised: 06/01/2016 Document Reviewed: 07/14/2015 Elsevier Interactive Patient Education  2017 [place] Prevention in the Home Falls can cause injuries. They can happen to people of all ages. There are many things you can do to make your home safe and to help prevent falls. What can I do on the outside of my home? Regularly fix the edges of walkways and driveways and fix any cracks. Remove anything that might make you trip as you walk through a door, such as a raised step or threshold. Trim any bushes or trees on the path to your home. Use bright outdoor lighting. Clear any walking paths of anything that might make someone trip, such as rocks or tools. Regularly check to see if handrails are loose or broken. Make sure that both sides of any steps have handrails. Any raised decks and porches should have guardrails on the edges. Have any leaves, snow, or ice cleared regularly. Use sand or salt on walking paths during winter. Clean up any spills in your garage right away. This includes oil or grease spills. What can I do in the bathroom? Use night lights. Install grab bars by the toilet and in the tub and shower. Do not use towel bars as grab  bars. Use non-skid mats or decals in the tub or shower. If you need to sit down in the shower, use a plastic, non-slip stool. Keep the floor dry. Clean up any water that spills on the floor as soon as it happens. Remove soap buildup in the tub or shower regularly. Attach bath mats securely with double-sided non-slip rug tape. Do not have throw rugs and other things on the floor that can make you trip. What can I do in the bedroom? Use night lights. Make sure that you have a light by your bed that is easy to reach. Do not use any sheets or blankets that are too big for your bed. They should not hang down onto the floor. Have a firm chair that has side arms. You can use this for support while you get dressed. Do not have throw rugs and other things on the floor that can make you trip. What can I do in the kitchen? Clean up any spills right away. Avoid walking on wet floors. Keep items that you use a lot in easy-to-reach places. If you need to reach something above you, use a strong  step stool that has a grab bar. Keep electrical cords out of the way. Do not use floor polish or wax that makes floors slippery. If you must use wax, use non-skid floor wax. Do not have throw rugs and other things on the floor that can make you trip. What can I do with my stairs? Do not leave any items on the stairs. Make sure that there are handrails on both sides of the stairs and use them. Fix handrails that are broken or loose. Make sure that handrails are as long as the stairways. Check any carpeting to make sure that it is firmly attached to the stairs. Fix any carpet that is loose or worn. Avoid having throw rugs at the top or bottom of the stairs. If you do have throw rugs, attach them to the floor with carpet tape. Make sure that you have a light switch at the top of the stairs and the bottom of the stairs. If you do not have them, ask someone to add them for you. What else can I do to help prevent  falls? Wear shoes that: Do not have high heels. Have rubber bottoms. Are comfortable and fit you well. Are closed at the toe. Do not wear sandals. If you use a stepladder: Make sure that it is fully opened. Do not climb a closed stepladder. Make sure that both sides of the stepladder are locked into place. Ask someone to hold it for you, if possible. Clearly mark and make sure that you can see: Any grab bars or handrails. First and last steps. Where the edge of each step is. Use tools that help you move around (mobility aids) if they are needed. These include: Canes. Walkers. Scooters. Crutches. Turn on the lights when you go into a dark area. Replace any light bulbs as soon as they burn out. Set up your furniture so you have a clear path. Avoid moving your furniture around. If any of your floors are uneven, fix them. If there are any pets around you, be aware of where they are. Review your medicines with your doctor. Some medicines can make you feel dizzy. This can increase your chance of falling. Ask your doctor what other things that you can do to help prevent falls. This information is not intended to replace advice given to you by your health care provider. Make sure you discuss any questions you have with your health care provider. Document Released: 07/09/2009 Document Revised: 02/18/2016 Document Reviewed: 10/17/2014 Elsevier Interactive Patient Education  2017 [employer].

## 2022-09-27 NOTE — Progress Notes (Signed)
Subjective:   Helon Wisinski is a 82 y.o. female who presents for Medicare Annual (Subsequent) preventive examination.  Review of Systems    No ROS.  Medicare Wellness Virtual Visit.  Visual/audio telehealth visit, UTA vital signs.   See social history for additional risk factors.   Cardiac Risk Factors include: advanced age (>89mn, >>13women);diabetes mellitus;hypertension     Objective:    Today's Vitals   09/27/22 1117  Weight: 154 lb (69.9 kg)  Height: _0  (1.6 m)   Body mass index is 27.28 kg/m.     09/27/2022   11:30 AM 05/10/2022    9:57 AM 12/22/2021   12:08 PM 12/16/2021    4:01 PM 09/23/2021   10:44 AM 09/10/2021   12:48 PM 05/22/2021    4:21 PM  Advanced Directives  Does Patient Have a Medical Advance Directive? Yes Yes No No Yes Yes No  Type of AParamedicof AClymerLiving will HDickeyLiving will   [place] will [place]Living will   Does patient want to make changes to medical advance directive? No - Patient declined    Yes (MAU/Ambulatory/Procedural Areas - Information given)    Copy of HMadeira Beachin Chart? Yes - validated most recent copy scanned in chart (See row information)    Yes - validated most recent copy scanned in chart (See row information)      Current Medications (verified) Outpatient Encounter Medications as of 09/27/2022  Medication Sig   acetaminophen (TYLENOL) 500 MG tablet Take 1 tablet (500 mg total) by mouth 3 (three) times daily as needed.   albuterol (VENTOLIN HFA) 108 (90 Base) MCG/ACT inhaler Inhale 2 puffs into the lungs every 6 (six) hours as needed.   Ascorbic Acid (VITAMIN C) 100 MG tablet Take 100 mg by mouth daily.   aspirin EC 81 MG tablet Take 1 tablet (81 mg total) by mouth daily. Swallow whole.   baclofen 5 MG TABS Take 5-10 mg by mouth 2 (two) times daily as needed for muscle spasms (occipital pain).   BD  ULTRA-FINE LANCETS lancets Use as instructed upto 6 times daily   Blood Glucose Monitoring Suppl (ONE TOUCH ULTRA 2) w/Device KIT Use as directed to check sugars twice daily and as needed   Cholecalciferol (VITAMIN D3) 25 MCG (1000 UT) CAPS Take 1 capsule (1,000 Units total) by mouth daily.   clindamycin (CLEOCIN T) 1 % external solution Apply topically daily. As needed for acne   clopidogrel (PLAVIX) 75 MG tablet TAKE ONE TABLET BY MOUTH EVERY EVENING   Continuous Blood Gluc Sensor (FREESTYLE LIBRE 3 SENSOR) MISC Place 1 sensor on the skin every 14 days. Use to check glucose continuously   Continuous Blood Gluc Sensor (FREESTYLE LIBRE 3 SENSOR) MISC USE TO CHECK BLOOD SUGARS DAILY   diclofenac Sodium (VOLTAREN) 1 % GEL Apply 2 g topically 3 (three) times daily.   Fluticasone-Umeclidin-Vilant (TRELEGY ELLIPTA) 200-62.5-25 MCG/ACT AEPB Inhale 1 puff into the lungs daily.   furosemide (LASIX) 20 MG tablet TAKE ONE TABLET BY MOUTH ONCE DAILY   Glucagon, rDNA, (GLUCAGON EMERGENCY) 1 MG KIT INJECT into THE muscle ONCE AS NEEDED FOR emergency   insulin degludec (TRESIBA FLEXTOUCH) 100 UNIT/ML FlexTouch Pen Inject 56 Units into the skin daily.   Insulin Pen Needle (COMFORT EZ PEN NEEDLES) 33G X 4 MM MISC Use 4-5 times a day   Insulin Syringe-Needle U-100 32G X 5/16" 0.5 ML MISC Use  as directed   losartan (COZAAR) 25 MG tablet Take 1 tablet (25 mg total) by mouth daily.   metoprolol succinate (TOPROL XL) 25 MG 24 hr tablet Take 1 tablet (25 mg total) by mouth daily.   Multiple Vitamin (MULTIVITAMIN ADULT) TABS Take 1 tablet by mouth in the morning and at bedtime.   ONETOUCH ULTRA test strip USE TO check blood glucose FOUR TIMES DAILY   rosuvastatin (CRESTOR) 20 MG tablet TAKE ONE TABLET BY MOUTH EVERY EVENING   SYNTHROID 75 MCG tablet TAKE ONE TABLET BY MOUTH Monday-Saturday BEFORE breakfast   topiramate (TOPAMAX) 50 MG tablet Take 1 tablet (50 mg total) by mouth 2 (two) times daily.   No  facility-administered encounter medications on file as of 09/27/2022.    Allergies (verified) Iodinated contrast media, Penicillins, Amlodipine, Anectine [succinylcholine], Carvedilol, Codeine, Gabapentin, Influenza vaccines, Insulin aspart, Nortriptyline, Pamelor [nortriptyline hcl], Prednisone, Valsartan, Zetia [ezetimibe], Erythromycin, and Sulfa antibiotics   History: Past Medical History:  Diagnosis Date   Acute diverticulitis 04/2021   [facility], [facility] confirmed   Allergy    ANA positive    positive ANA pattern 1 speckled   Arthritis    Carotid stenosis, asymptomatic 06/19/2015   9-56% RICA 38-75% LICA rpt 1 yr (02/4331)    CHF (congestive heart failure) (HCC)    Colon polyps    COVID-19 virus infection 09/14/2021   Dermatomyositis ([place])    Diabetes mellitus without complication ([place])    Type 1   Diverticulosis    sigmoid on CT scan 12/2019   Family history of adverse reaction to anesthesia    brothr went into cardiac arrest from anectine   Fibromyalgia    prior PCP   GERD (gastroesophageal reflux disease)    prior PCP   Glaucoma    Narrow angle   History of blood clots    DVT, in 20s, none since   History of chicken pox    History of diverticulitis    History of pericarditis 1986   with hospitalization   History of pneumonia 2014   History of shingles    History of UTI    Hyperlipidemia    Hypertension    Hypothyroidism    Mixed connective tissue disease ([place])    Partial small bowel obstruction ([place]) 12/2019   managed conservatively   Peptic ulcer    Pneumonia    PONV (postoperative nausea and vomiting)    Raynaud's disease without gangrene    Shoulder pain left   h/o RTC tendonitis and adhesive capsulitis   Sigmoid diverticulitis 05/26/2021   Sjogren's syndrome ([place])    Sleep apnea    prior PCP - no CPAP for about 10 yrs   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[place])    Vitamin D deficiency    prior PCP   Past Surgical History:  Procedure Laterality Date   ABDOMINAL  HYSTERECTOMY  1978   fibroids and menorrhagia, ovaries remain   ARTERY BIOPSY Right 04/06/2018   Procedure: BIOPSY TEMPORAL ARTERY RIGHT;  Surgeon: Beverly Gust, MD;  Location: [facility];  Service: ENT;  Laterality: Right;  Diabetic - insulin pump sleep apnea   [facility] normal per patient   COLONOSCOPY  10/2011   1 TA, 1 HP, very tortuous colon ([place])   COLONOSCOPY  02/2020   TA, inflammatory polyp, mod diverticulosis, int/ext hemorrhoids ([place]) no rpt recommended    COLONOSCOPY WITH ESOPHAGOGASTRODUODENOSCOPY (EGD)  03/2007   2 ulcers, benign polyp, rpt 5 yrs (  [facility])   JOINT REPLACEMENT Right    hip   PARTIAL HIP ARTHROPLASTY  2013   Right hip replacement   TONSILLECTOMY     TONSILLECTOMY AND ADENOIDECTOMY     TRANSCAROTID ARTERY REVASCULARIZATION  Left 07/23/2018   Procedure: TRANSCAROTID ARTERY REVASCULARIZATION;  Surgeon: Marty Heck, MD;  Location: [facility];  Service: Vascular;  Laterality: Left;   TUBAL LIGATION     VAGINAL DELIVERY     x2, no complications   Family History  Problem Relation Age of Onset   CAD Mother 64       MI, aortic valve issues   COPD Mother    Lupus Mother    [name] Mother    Rheum arthritis Mother    CAD Father 77       CABG x2, aortic valve replacement   Stroke Sister    CAD Sister    44 Sister        brain   Lupus Sister    Diabetes Sister    Alcohol abuse Brother    CAD Brother 67       MI   Stroke Brother    Seizures Son    COPD Brother        agent orange   CAD Brother 80       stent   Diabetes Brother    Depression Grandchild    Breast cancer Maternal Aunt    Diabetes Sister    Breast cancer Sister    Breast cancer Maternal Aunt    Stroke Maternal Grandmother    Hypertension Maternal Grandmother    Gallbladder disease Maternal Grandmother    Colon cancer Neg Hx    Esophageal cancer Neg Hx    Rectal cancer Neg Hx    Stomach cancer  Neg Hx    Social History   Socioeconomic History   Marital status: Widowed    Spouse name: Not on file   Number of children: 2   Years of education: Not on file   Highest education level: Not on file  Occupational History   Occupation: retired  Tobacco Use   Smoking status: Never   Smokeless tobacco: Never  Vaping Use   Vaping Use: Never used  Substance and Sexual Activity   Alcohol use: No   Drug use: No   Sexual activity: Not on file  Other Topics Concern   Not on file  Social History Narrative   Lives in [place], moved from [place].    Widow - husband decreased 01/2016 of metastatic colon CA   No pets.   Son Ameira Alessandrini lives nearby. Daughter lives in [place]. Sister lives 2 blocks away.    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, [employer]   Exercise - limited   Diet - good water, fruits/vegetables daily, limited meat, protein drink every morning   Social Determinants of Health   Financial Resource Strain: Low Risk  (09/27/2022)   Overall Financial Resource Strain (CARDIA)    Difficulty of Paying Living Expenses: Not hard at all  Food Insecurity: No Food Insecurity (09/27/2022)   Hunger Vital Sign    Worried About Running Out of Food in the Last Year: Never true    Ran Out of Food in the Last Year: Never true  Transportation Needs: No Transportation Needs (09/27/2022)   PRAPARE - Hydrologist (  Medical): No    Lack of Transportation (Non-Medical): No  Physical Activity: Insufficiently Active (09/27/2022)   Exercise Vital Sign    Days of Exercise per Week: 3 days    Minutes of Exercise per Session: 30 min  Stress: No Stress Concern Present (09/27/2022)   [place]    Feeling of Stress : Not at all  Social Connections: Moderately Integrated (09/27/2022)   Social Connection and Isolation Panel  [NHANES]    Frequency of Communication with Friends and Family: More than three times a week    Frequency of Social Gatherings with Friends and Family: More than three times a week    Attends Religious Services: More than 4 times per year    Active Member of [employer] or Organizations: Yes    Attends Archivist Meetings: More than 4 times per year    Marital Status: Widowed    Tobacco Counseling Counseling given: Not Answered   Clinical Intake:  Pre-visit preparation completed: Yes        Diabetes: Yes (Followed by PCP)  How often do you need to have someone help you when you read instructions, pamphlets, or other written materials from your doctor or pharmacy?: 1 - Never  Nutrition Risk Assessment: Has the patient had any N/V/D within the last 2 months?  No  Does the patient have any non-healing wounds?  No  Has the patient had any unintentional weight loss or weight gain?  No   Diabetes: Is the patient diabetic?  Yes  If diabetic, was a CBG obtained today?  No  Did the patient bring in their glucometer from home?  No  How often do you monitor your CBG's? 2-3 daily.   Financial Strains and Diabetes Management: Are you having any financial strains with the device, your supplies or your medication? No .  Does the patient want to be seen by Chronic Care Management for management of their diabetes?  No  Would the patient like to be referred to a Nutritionist or for Diabetic Management?  No     Interpreter Needed?: No      Activities of Daily Living    09/27/2022   11:33 AM 01/31/2022   11:10 AM  In your present state of health, do you have any difficulty performing the following activities:  Hearing? 0 0  Vision? 0 0  Difficulty concentrating or making decisions? 0 0  Walking or climbing stairs? 0 1  Dressing or bathing? 0 0  Doing errands, shopping? 0 0  Preparing Food and eating ? N   Using the Toilet? N   In the past six months, have you accidently  leaked urine? N   Do you have problems with loss of bowel control? N   Managing your Medications? N   Managing your Finances? Y   Housekeeping or managing your Housekeeping? N     Patient Care Team: [name], MD as PCP - General (Family Medicine) Rockey Situ Kathlene November, MD as PCP - Cardiology (Cardiology) Birder Robson, MD as Referring Physician (Ophthalmology) Charlton Haws, [facility] as Pharmacist (Pharmacist)  Indicate any recent Medical Services you may have received from other than [facility] providers in the past year (date may be approximate).     Assessment:   This is a routine wellness examination for Raja.  I connected with  Venisa Frampton Gander on 09/27/22 by a audio enabled telemedicine application and verified that I am speaking with the correct person  using two identifiers.  Patient Location: Home  Provider Location: Office/Clinic  I discussed the limitations of evaluation and management by telemedicine. The patient expressed understanding and agreed to proceed.   Hearing/Vision screen Hearing Screening - Comments:: Patient is able to hear conversational tones without difficulty.  No issues reported.   Vision Screening - Comments:: Last eye exam in fall of 2023 at [facility] issues and exercise activities discussed: Current Exercise Habits: Home exercise routine, Type of exercise: walking;stretching, Time (Minutes): 30, Frequency (Times/Week): 3, Weekly Exercise (Minutes/Week): 90, Intensity: Mild Healthy diet Good water intake   Goals Addressed             This Visit's Progress    Maintain healthy lifestyle       Stay active Healthy diet Good water intake       Depression Screen    09/27/2022   11:31 AM 09/23/2021   10:48 AM 10/07/2020    8:55 AM 09/09/2020   10:50 AM 01/13/2020   10:29 AM 09/13/2019    8:30 AM 08/29/2018    8:35 AM  PHQ 2/9 Scores  PHQ - 2 Score 0 0 0 0 0 0 0  PHQ- 9 Score   0 0   0    Fall  Risk    09/27/2022   11:38 AM 01/31/2022   11:10 AM 12/20/2021   11:48 AM 09/23/2021   10:46 AM 10/07/2020    8:55 AM  Fall Risk   Falls in the past year?  0 0 1 0  Number falls in past yr:  0 0 0 0  Injury with Fall?  0 0 1 0  Comment    brusing   Risk for fall due to :    Other (Comment)   Risk for fall due to: Comment    took a wrong step due to blood sugar   Follow up Falls evaluation completed;Falls prevention discussed Falls evaluation completed Falls evaluation completed Falls prevention discussed Falls evaluation completed    FALL RISK PREVENTION PERTAINING TO THE HOME: Home free of loose throw rugs in walkways, pet beds, electrical cords, etc? Yes  Adequate lighting in your home to reduce risk of falls? Yes   ASSISTIVE DEVICES UTILIZED TO PREVENT FALLS: Life alert? Yes  Use of a cane, walker or w/c? No  Grab bars in the bathroom? Yes  Shower chair or bench in shower? Yes  Elevated toilet seat or a handicapped toilet? No   TIMED UP AND GO: Was the test performed? No .   Cognitive Function:    09/09/2020   10:55 AM 08/29/2018    8:50 AM 08/09/2016    8:42 AM  MMSE - Mini Mental State Exam  Orientation to time _0 Orientation to Place _1 Registration _2 Attention/ Calculation 5 0 0  Recall _3 Language- name 2 objects  0 0  Language- repeat _4 Language- follow 3 step command  3 3  Language- read & follow direction  0 0  Write a sentence  0 0  Copy design  0 0  Total score  20 20        09/27/2022   11:42 AM  6CIT Screen  What Year? 0 points  What month? 0 points  What time? 0 points  Count back from 20 0 points  Months in reverse 0 points  Repeat phrase 0 points  Total Score  0 points    Immunizations Immunization History  Administered Date(s) Administered   Pneumococcal Conjugate-13 09/23/2014   Pneumococcal Polysaccharide-23 01/05/2010, 07/17/2013   Tdap 10/07/2011   TDAP status: Due, Education has been provided regarding the  importance of this vaccine. Advised may receive this vaccine at local pharmacy or Health Dept. Aware to provide a copy of the vaccination record if obtained from local pharmacy or Health Dept. Verbalized acceptance and understanding. Deferred.  Shingrix Completed?: No.    Education has been provided regarding the importance of this vaccine. Patient has been advised to call insurance company to determine out of pocket expense if they have not yet received this vaccine. Advised may also receive vaccine at local pharmacy or Health Dept. Verbalized acceptance and understanding.  Screening Tests Health Maintenance  Topic Date Due   Diabetic kidney evaluation - Urine ACR  01/10/2017   DTaP/Tdap/Td (2 - Td or Tdap) 10/06/2021   Zoster Vaccines- Shingrix (1 of 2) 12/27/2022 (Originally 06/27/1991)   COVID-19 Vaccine (1) 09/25/2024 (Originally 12/27/1941)   HEMOGLOBIN A1C  12/21/2022   MAMMOGRAM  03/09/2023   FOOT EXAM  08/06/2023   Diabetic kidney evaluation - eGFR measurement  08/17/2023   OPHTHALMOLOGY EXAM  08/26/2023   Medicare Annual Wellness (AWV)  09/28/2023   Pneumonia Vaccine 66+ Years old  Completed   DEXA SCAN  Completed   HPV VACCINES  Aged Out   Health Maintenance Health Maintenance Due  Topic Date Due   Diabetic kidney evaluation - Urine ACR  01/10/2017   DTaP/Tdap/Td (2 - Td or Tdap) 10/06/2021   Lung Cancer Screening: (Low Dose CT Chest recommended if Age 68-80 years, 30 pack-year currently smoking OR have quit w/in 15years.) does not qualify.   Hepatitis C Screening: does not qualify.  Vision Screening: Recommended annual ophthalmology exams for early detection of glaucoma and other disorders of the eye.  Dental Screening: Recommended annual dental exams for proper oral hygiene  Community Resource Referral / Chronic Care Management: CRR required this visit?  No   CCM required this visit?  No      Plan:     I have personally reviewed and noted the following in the  patient's chart:   Medical and social history Use of alcohol, tobacco or illicit drugs  Current medications and supplements including opioid prescriptions. Patient is not currently taking opioid prescriptions. Functional ability and status Nutritional status Physical activity Advanced directives List of other physicians Hospitalizations, surgeries, and ER visits in previous 12 months Vitals Screenings to include cognitive, depression, and falls Referrals and appointments  In addition, I have reviewed and discussed with patient certain preventive protocols, quality metrics, and best practice recommendations. A written personalized care plan for preventive services as well as general preventive health recommendations were provided to patient.     Leta Jungling, LPN   11/29/5730

## 2022-09-28 ENCOUNTER — Encounter: Payer: Self-pay | Admitting: Family Medicine

## 2022-09-28 DIAGNOSIS — G4733 Obstructive sleep apnea (adult) (pediatric): Secondary | ICD-10-CM | POA: Diagnosis not present

## 2022-09-30 ENCOUNTER — Encounter: Payer: Self-pay | Admitting: Family Medicine

## 2022-09-30 MED ORDER — TRAMADOL HCL 50 MG PO TABS: 25.0000 mg | ORAL_TABLET | Freq: Every evening | ORAL | 0 refills | Status: AC | PRN

## 2022-09-30 NOTE — Telephone Encounter (Signed)
Spoke with patient, reviewed upcoming PM&R appt with Chasnis 1/25.   She is having ongoing mid thoracic back worse at bedtime when she's trying to sleep.   Will Rx tramadol '50mg'$  1/2-1 tab nightly PRN. Reviewed sedation and constipation precautions.

## 2022-10-03 ENCOUNTER — Telehealth: Payer: Self-pay | Admitting: Pulmonary Disease

## 2022-10-03 NOTE — Telephone Encounter (Signed)
HST results have been scanned into Epic

## 2022-10-08 ENCOUNTER — Other Ambulatory Visit: Payer: Self-pay | Admitting: Family Medicine

## 2022-10-08 DIAGNOSIS — E1169 Type 2 diabetes mellitus with other specified complication: Secondary | ICD-10-CM

## 2022-10-10 ENCOUNTER — Other Ambulatory Visit: Payer: Self-pay | Admitting: Family Medicine

## 2022-10-10 DIAGNOSIS — Z1231 Encounter for screening mammogram for malignant neoplasm of breast: Secondary | ICD-10-CM

## 2022-10-11 ENCOUNTER — Other Ambulatory Visit: Payer: Self-pay

## 2022-10-11 ENCOUNTER — Other Ambulatory Visit: Payer: Self-pay | Admitting: Family Medicine

## 2022-10-11 DIAGNOSIS — G4733 Obstructive sleep apnea (adult) (pediatric): Secondary | ICD-10-CM

## 2022-10-20 DIAGNOSIS — M4802 Spinal stenosis, cervical region: Secondary | ICD-10-CM | POA: Diagnosis not present

## 2022-10-20 DIAGNOSIS — M5412 Radiculopathy, cervical region: Secondary | ICD-10-CM | POA: Diagnosis not present

## 2022-10-20 DIAGNOSIS — M62838 Other muscle spasm: Secondary | ICD-10-CM | POA: Diagnosis not present

## 2022-10-20 DIAGNOSIS — M5134 Other intervertebral disc degeneration, thoracic region: Secondary | ICD-10-CM | POA: Diagnosis not present

## 2022-10-21 ENCOUNTER — Other Ambulatory Visit: Payer: Self-pay | Admitting: Physical Medicine and Rehabilitation

## 2022-10-21 DIAGNOSIS — M5412 Radiculopathy, cervical region: Secondary | ICD-10-CM

## 2022-10-26 ENCOUNTER — Ambulatory Visit
Admission: RE | Admit: 2022-10-26 | Discharge: 2022-10-26 | Disposition: A | Discharge status: Home / Self Care | Payer: PPO | Source: Ambulatory Visit | Attending: Physical Medicine and Rehabilitation | Admitting: Physical Medicine and Rehabilitation

## 2022-10-26 ENCOUNTER — Telehealth: Payer: Self-pay | Admitting: Pulmonary Disease

## 2022-10-26 DIAGNOSIS — M5412 Radiculopathy, cervical region: Secondary | ICD-10-CM

## 2022-10-26 DIAGNOSIS — M4802 Spinal stenosis, cervical region: Secondary | ICD-10-CM | POA: Diagnosis not present

## 2022-10-26 DIAGNOSIS — M47812 Spondylosis without myelopathy or radiculopathy, cervical region: Secondary | ICD-10-CM | POA: Diagnosis not present

## 2022-10-26 NOTE — Telephone Encounter (Signed)
FYI received email from [facility] with [place].  Per our CSR when trying to schedule, patient refused CPAP setup due to other medical issues.  If the patient changes their mind, they can contact our office and we can re-open the order.

## 2022-10-26 NOTE — Telephone Encounter (Signed)
Noted, thank you

## 2022-10-27 ENCOUNTER — Other Ambulatory Visit: Payer: Self-pay | Admitting: Family Medicine

## 2022-10-27 DIAGNOSIS — E063 Autoimmune thyroiditis: Secondary | ICD-10-CM

## 2022-10-27 DIAGNOSIS — E559 Vitamin D deficiency, unspecified: Secondary | ICD-10-CM

## 2022-10-27 DIAGNOSIS — E13319 Other specified diabetes mellitus with unspecified diabetic retinopathy without macular edema: Secondary | ICD-10-CM

## 2022-10-27 DIAGNOSIS — E1169 Type 2 diabetes mellitus with other specified complication: Secondary | ICD-10-CM

## 2022-10-28 ENCOUNTER — Other Ambulatory Visit (INDEPENDENT_AMBULATORY_CARE_PROVIDER_SITE_OTHER): Payer: PPO

## 2022-10-28 DIAGNOSIS — E785 Hyperlipidemia, unspecified: Secondary | ICD-10-CM

## 2022-10-28 DIAGNOSIS — E559 Vitamin D deficiency, unspecified: Secondary | ICD-10-CM

## 2022-10-28 DIAGNOSIS — E063 Autoimmune thyroiditis: Secondary | ICD-10-CM | POA: Diagnosis not present

## 2022-10-28 DIAGNOSIS — E038 Other specified hypothyroidism: Secondary | ICD-10-CM

## 2022-10-28 DIAGNOSIS — E1169 Type 2 diabetes mellitus with other specified complication: Secondary | ICD-10-CM | POA: Diagnosis not present

## 2022-10-28 DIAGNOSIS — E13319 Other specified diabetes mellitus with unspecified diabetic retinopathy without macular edema: Secondary | ICD-10-CM

## 2022-10-28 LAB — COMPREHENSIVE METABOLIC PANEL
ALT: 16 U/L (ref 0–35)
AST: 16 U/L (ref 0–37)
Albumin: 3.9 g/dL (ref 3.5–5.2)
Alkaline Phosphatase: 87 U/L (ref 39–117)
BUN: 11 mg/dL (ref 6–23)
CO2: 28 mEq/L (ref 19–32)
Calcium: 9.3 mg/dL (ref 8.4–10.5)
Chloride: 104 mEq/L (ref 96–112)
Creatinine, Ser: 0.87 mg/dL (ref 0.40–1.20)
GFR: 62.96 mL/min (ref >60.00)
Glucose, Bld: 215 mg/dL — ABNORMAL HIGH (ref 70–99)
Potassium: 3.9 mEq/L (ref 3.5–5.1)
Sodium: 142 mEq/L (ref 135–145)
Total Bilirubin: 0.4 mg/dL (ref 0.2–1.2)
Total Protein: 6.2 g/dL (ref 6.0–8.3)

## 2022-10-28 LAB — LIPID PANEL
Cholesterol: 178 mg/dL (ref 0–200)
HDL: 50.2 mg/dL (ref >39.00)
LDL Cholesterol: 102 mg/dL — ABNORMAL HIGH (ref 0–99)
NonHDL: 128.28
Total CHOL/HDL Ratio: 4
Triglycerides: 129 mg/dL (ref 0.0–149.0)
VLDL: 25.8 mg/dL (ref 0.0–40.0)

## 2022-10-28 LAB — MICROALBUMIN / CREATININE URINE RATIO
Creatinine,U: 65 mg/dL
Microalb Creat Ratio: 1.1 mg/g (ref 0.0–30.0)
Microalb, Ur: <0.7 mg/dL (ref 0.0–1.9)

## 2022-10-28 LAB — VITAMIN D 25 HYDROXY (VIT D DEFICIENCY, FRACTURES): VITD: 31.99 ng/mL (ref 30.00–100.00)

## 2022-10-28 LAB — HEMOGLOBIN A1C: Hgb A1c MFr Bld: 11.4 % — ABNORMAL HIGH (ref 4.6–6.5)

## 2022-10-28 LAB — TSH: TSH: 2.49 u[IU]/mL (ref 0.35–5.50)

## 2022-10-31 ENCOUNTER — Encounter: Payer: Self-pay | Admitting: Family Medicine

## 2022-11-04 ENCOUNTER — Encounter: Payer: Self-pay | Admitting: Family Medicine

## 2022-11-04 ENCOUNTER — Ambulatory Visit (INDEPENDENT_AMBULATORY_CARE_PROVIDER_SITE_OTHER): Payer: PPO | Admitting: Family Medicine

## 2022-11-04 VITALS — BP 122/66 | HR 73 | Temp 97.2°F | Ht 63.5 in | Wt 151.4 lb

## 2022-11-04 DIAGNOSIS — E13319 Other specified diabetes mellitus with unspecified diabetic retinopathy without macular edema: Secondary | ICD-10-CM

## 2022-11-04 DIAGNOSIS — Z7189 Other specified counseling: Secondary | ICD-10-CM

## 2022-11-04 DIAGNOSIS — M797 Fibromyalgia: Secondary | ICD-10-CM

## 2022-11-04 DIAGNOSIS — M546 Pain in thoracic spine: Secondary | ICD-10-CM | POA: Diagnosis not present

## 2022-11-04 DIAGNOSIS — I10 Essential (primary) hypertension: Secondary | ICD-10-CM

## 2022-11-04 DIAGNOSIS — R0609 Other forms of dyspnea: Secondary | ICD-10-CM | POA: Diagnosis not present

## 2022-11-04 DIAGNOSIS — I739 Peripheral vascular disease, unspecified: Secondary | ICD-10-CM | POA: Diagnosis not present

## 2022-11-04 DIAGNOSIS — Z0000 Encounter for general adult medical examination without abnormal findings: Secondary | ICD-10-CM

## 2022-11-04 DIAGNOSIS — I7300 Raynaud's syndrome without gangrene: Secondary | ICD-10-CM

## 2022-11-04 DIAGNOSIS — I5189 Other ill-defined heart diseases: Secondary | ICD-10-CM | POA: Diagnosis not present

## 2022-11-04 DIAGNOSIS — G8929 Other chronic pain: Secondary | ICD-10-CM

## 2022-11-04 DIAGNOSIS — M3500 Sicca syndrome, unspecified: Secondary | ICD-10-CM | POA: Diagnosis not present

## 2022-11-04 DIAGNOSIS — G4733 Obstructive sleep apnea (adult) (pediatric): Secondary | ICD-10-CM

## 2022-11-04 DIAGNOSIS — E785 Hyperlipidemia, unspecified: Secondary | ICD-10-CM

## 2022-11-04 DIAGNOSIS — I6522 Occlusion and stenosis of left carotid artery: Secondary | ICD-10-CM | POA: Diagnosis not present

## 2022-11-04 DIAGNOSIS — E559 Vitamin D deficiency, unspecified: Secondary | ICD-10-CM

## 2022-11-04 DIAGNOSIS — I700 Atherosclerosis of aorta: Secondary | ICD-10-CM | POA: Diagnosis not present

## 2022-11-04 DIAGNOSIS — E038 Other specified hypothyroidism: Secondary | ICD-10-CM | POA: Diagnosis not present

## 2022-11-04 DIAGNOSIS — T380X5A Adverse effect of glucocorticoids and synthetic analogues, initial encounter: Secondary | ICD-10-CM

## 2022-11-04 DIAGNOSIS — R739 Hyperglycemia, unspecified: Secondary | ICD-10-CM

## 2022-11-04 DIAGNOSIS — E063 Autoimmune thyroiditis: Secondary | ICD-10-CM

## 2022-11-04 DIAGNOSIS — E1169 Type 2 diabetes mellitus with other specified complication: Secondary | ICD-10-CM

## 2022-11-04 DIAGNOSIS — M85832 Other specified disorders of bone density and structure, left forearm: Secondary | ICD-10-CM

## 2022-11-04 MED ORDER — SYNTHROID 75 MCG PO TABS: ORAL_TABLET | ORAL | 4 refills | Status: DC

## 2022-11-04 MED ORDER — TRESIBA FLEXTOUCH 100 UNIT/ML ~~LOC~~ SOPN: 60.0000 [IU] | PEN_INJECTOR | Freq: Every day | SUBCUTANEOUS | 1 refills | Status: DC

## 2022-11-04 MED ORDER — BACLOFEN 5 MG PO TABS: 5.0000 mg | ORAL_TABLET | Freq: Every evening | ORAL | 0 refills | Status: DC | PRN

## 2022-11-04 MED ORDER — VITAMIN D3 25 MCG (1000 UT) PO CAPS: 2.0000 | ORAL_CAPSULE | Freq: Every day | ORAL | Status: AC

## 2022-11-04 NOTE — Patient Instructions (Addendum)
For pain - continue tylenol 559m three times daily, add baclofen muscle relaxant at night as well as try topiramate twice daily.  For diabetes - increase tresiba to 60 units daily. Work on diet.  Increase vitamin D to 2000 IU daily.  Good to see you today Return in 3 months for diabetes follow up visit

## 2022-11-04 NOTE — Progress Notes (Unsigned)
Patient ID: Cassandra Benson, female    DOB: 1941-09-07, 82 y.o.   MRN: [id number]  This visit was conducted in person.  BP 122/66   Pulse 73   Temp (!) 97.2 F (36.2 C) (Temporal)   Ht 5' 3.5" (1.613 m)   Wt 151 lb 6 oz (68.7 kg)   SpO2 95%   BMI 26.39 kg/m    CC: CPE Subjective:   HPI: Cassandra Benson is a 82 y.o. female presenting on 11/04/2022 for Annual Exam (MCR prt 2 [AWV- 09/27/22]. )   Saw health advisor last month for medicare wellness visit. Note reviewed.  No results found.  [place] Office Visit from 11/04/2022 in [facility]  PHQ-2 Total Score 0          11/04/2022   10:25 AM 09/27/2022   11:38 AM 01/31/2022   11:10 AM 12/20/2021   11:48 AM 09/23/2021   10:46 AM  Fall Risk   Falls in the past year? 1  0 0 1  Number falls in past yr: 0  0 0 0  Injury with Fall? 0  0 0 1  Comment     brusing  Risk for fall due to :     Other (Comment)  Risk for fall due to: Comment     took a wrong step due to blood sugar  Follow up  Falls evaluation completed;Falls prevention discussed Falls evaluation completed Falls evaluation completed Falls prevention discussed   Worsened diabetes control - has not yet been able to establish with endocrinology for her LADA.   Established with PM&R for chronic neck and thoracic back pain as well as numbness to left forearm and hand. 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 Started on baclofen 51m 1-2 tab QHs prn - hasn't been taking. Planned MRI cervical neck and to start PT (March) then f/u with PM&R. She continues struggling with back pain.   HST through pulm returned showing OSA with O2 drop to 82%, planning to start CPAP 5-20 cmH20.   DM - she is using Freestyle Libre 3 - but having difficulty with data transmitting to reader. She continues tresiba 56 u QAM - she stopped novolog with improvement in  cognition.  Lab Results  Component Value Date   HGBA1C 11.4 (H) 10/28/2022     Preventative: COLONOSCOPY 10/2011 - 1 TA, 1 HP, very tortuous colon [facility]oscopy (02/2020) - TA, inflammatory polyp, mod diverticulosis, int/ext hemorrhoids ([facility]) no rpt recommended  Mammo 02/2022 - Birads1 @ [place].  Well woman exam - last 2013 s/p abd hysterectomy, ovaries remain. Denies pelvic symptoms  DEXA scan - normal per patient 04/2012  DEXA 12/2021 - T -2.3 L forearm, normal FRAX fracture risk, osteopenia.  Flu shot - ALLERGIC COVID vaccine - declined Tdap 07/2012.  Pneumovax 2014, [id number].  Shingrix - declines. Has had shingles.  Advanced directive: forms were scanned (08/2017) - HCPOA are son MJawana Whitescarverand JNani Skillern Does not want prolonged life support if terminal condition. HCPOA has authority to override directives.  Seat belt use discussed  Sunscreen use discussed, no changing moles on skin  Non smoker  Alcohol - none  Dentist overdue - rec more frequent due to sjogren's  Eye exam yearly - AWanette(Benay Pillow  Bowel - no diarrhea/constipation  Bladder - some stress incontinence symptoms    [place] Recently moved from [place]. Husband deceased mid Dec 20, 2015. No pets.  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  Diet - good water, fruits/vegetables daily, limited meat, protein drink every morning      Relevant past medical, surgical, family and social history reviewed and updated as indicated. Interim medical history since our last visit reviewed. Allergies and medications reviewed and updated. Outpatient Medications Prior to Visit  Medication Sig Dispense Refill   acetaminophen (TYLENOL) 500 MG tablet Take 1 tablet (500 mg total) by mouth 3 (three) times daily as needed.      albuterol (VENTOLIN HFA) 108 (90 Base) MCG/ACT inhaler Inhale 2 puffs into the lungs every 6 (six) hours as needed. 8 g 2   Ascorbic Acid (VITAMIN C) 100 MG tablet Take 100 mg by mouth daily.     aspirin EC 81 MG tablet Take 1 tablet (81 mg total) by mouth daily. Swallow whole. 30 tablet 12   BD ULTRA-FINE LANCETS lancets Use as instructed upto 6 times daily 600 each 2   Blood Glucose Monitoring Suppl (ONE TOUCH ULTRA 2) w/Device KIT Use as directed to check sugars twice daily and as needed 1 kit 0   clindamycin (CLEOCIN T) 1 % external solution Apply topically daily. As needed for acne 30 mL 0   clopidogrel (PLAVIX) 75 MG tablet TAKE ONE TABLET BY MOUTH EVERY EVENING 30 tablet 6   Continuous Blood Gluc Sensor (FREESTYLE LIBRE 3 SENSOR) MISC Place 1 sensor on the skin every 14 days. Use to check glucose continuously 2 each 3   Continuous Blood Gluc Sensor (FREESTYLE LIBRE 3 SENSOR) MISC USE TO CHECK BLOOD SUGARS DAILY 2 each 3   diclofenac Sodium (VOLTAREN) 1 % GEL Apply 2 g topically 3 (three) times daily. 100 g 1   furosemide (LASIX) 20 MG tablet TAKE ONE TABLET BY MOUTH ONCE DAILY 30 tablet 4   Glucagon, rDNA, (GLUCAGON EMERGENCY) 1 MG KIT INJECT into THE muscle ONCE AS NEEDED FOR emergency 1 kit 12   Insulin Pen Needle (COMFORT EZ PEN NEEDLES) 33G X 4 MM MISC Use 4-5 times a day 300 each 3   Insulin Syringe-Needle U-100 32G X 5/16" 0.5 ML MISC Use as directed 100 each 0   losartan (COZAAR) 25 MG tablet Take 1 tablet (25 mg total) by mouth daily. 90 tablet 3   metoprolol succinate (TOPROL XL) 25 MG 24 hr tablet Take 1 tablet (25 mg total) by mouth daily. 30 tablet 5   Multiple Vitamin (MULTIVITAMIN ADULT) TABS Take 1 tablet by mouth in the morning and at bedtime.     ONETOUCH ULTRA test strip USE TO check blood glucose FOUR TIMES DAILY 400 strip 2   rosuvastatin (CRESTOR) 20 MG tablet TAKE ONE TABLET BY MOUTH EVERY EVENING 90 tablet 2   topiramate (TOPAMAX) 50 MG tablet Take 1 tablet (50 mg  total) by mouth 2 (two) times daily. 60 tablet 12   TRELEGY ELLIPTA 200-62.5-25 MCG/ACT AEPB INHALE 1 PUFF BY MOUTH INTO LUNGS ONCE daily 60 each 1   baclofen 5 MG TABS Take 5-10 mg by mouth 2 (two) times daily as needed for muscle spasms (occipital pain). 30 tablet 0   Cholecalciferol (VITAMIN D3) 25 MCG (1000 UT) CAPS Take 1 capsule (1,000 Units total) by mouth daily. 30 capsule    Fluticasone-Umeclidin-Vilant (TRELEGY ELLIPTA) 200-62.5-25 MCG/ACT  AEPB Inhale into the lungs.     insulin degludec (TRESIBA FLEXTOUCH) 100 UNIT/ML FlexTouch Pen Inject into the skin.     SYNTHROID 75 MCG tablet TAKE ONE TABLET BY MOUTH Monday-Saturday BEFORE breakfast 80 tablet 0   TRESIBA FLEXTOUCH 100 UNIT/ML FlexTouch Pen INJECT 56 UNITS into THE SKIN ONCE DAILY 15 mL 1   No facility-administered medications prior to visit.     Per HPI unless specifically indicated in ROS section below Review of Systems  Constitutional:  Negative for activity change, appetite change, chills, fatigue, fever and unexpected weight change.  HENT:  Negative for hearing loss.   Eyes:  Negative for visual disturbance.  Respiratory:  Positive for cough (mild intermittent), chest tightness and shortness of breath. Negative for wheezing.   Cardiovascular:  Positive for leg swelling. Negative for chest pain and palpitations.  Gastrointestinal:  Negative for abdominal distention, abdominal pain, blood in stool, constipation, diarrhea, nausea and vomiting.  Genitourinary:  Positive for frequency. Negative for difficulty urinating and hematuria.  Musculoskeletal:  Negative for arthralgias, myalgias and neck pain.  Skin:  Negative for rash.  Neurological:  Positive for dizziness. Negative for seizures, syncope and headaches.  Hematological:  Negative for adenopathy. Does not bruise/bleed easily.  Psychiatric/Behavioral:  Negative for dysphoric mood. The patient is not nervous/anxious.     Objective:  BP 122/66   Pulse 73   Temp (!)  97.2 F (36.2 C) (Temporal)   Ht 5' 3.5" (1.613 m)   Wt 151 lb 6 oz (68.7 kg)   SpO2 95%   BMI 26.39 kg/m   Wt Readings from Last 3 Encounters:  11/04/22 151 lb 6 oz (68.7 kg)  09/27/22 154 lb (69.9 kg)  09/15/22 154 lb 6.4 oz (70 kg)      Physical Exam Vitals and nursing note reviewed.  Constitutional:      Appearance: Normal appearance. She is not ill-appearing.  HENT:     Head: Normocephalic and atraumatic.     Right Ear: Tympanic membrane, ear canal and external ear normal. There is no impacted cerumen.     Left Ear: Tympanic membrane, ear canal and external ear normal. There is no impacted cerumen.     Mouth/Throat:     Comments: Wearing mask Eyes:     General:        Right eye: No discharge.        Left eye: No discharge.     Extraocular Movements: Extraocular movements intact.     Conjunctiva/sclera: Conjunctivae normal.     Pupils: Pupils are equal, round, and reactive to light.  Neck:     Thyroid: No thyroid mass or thyromegaly.  Cardiovascular:     Rate and Rhythm: Normal rate and regular rhythm.     Pulses: Normal pulses.     Heart sounds: Normal heart sounds. No murmur heard. Pulmonary:     Effort: Pulmonary effort is normal. No respiratory distress.     Breath sounds: Normal breath sounds. No wheezing, rhonchi or rales.  Abdominal:     General: Bowel sounds are normal. There is no distension.     Palpations: Abdomen is soft. There is no mass.     Tenderness: There is generalized abdominal tenderness (mild-mod). There is no guarding or rebound. Negative signs include Murphy's sign.     Hernia: No hernia is present.  Musculoskeletal:     Cervical back: Normal range of motion and neck supple. No rigidity.     Right lower leg: No edema.  Left lower leg: No edema.  Lymphadenopathy:     Cervical: No cervical adenopathy.  Skin:    General: Skin is warm and dry.     Findings: No rash.  Neurological:     General: No focal deficit present.     Mental  Status: She is alert. Mental status is at baseline.  Psychiatric:        Mood and Affect: Mood normal.        Behavior: Behavior normal.       Results for orders placed or performed in visit on 10/28/22  VITAMIN D 25 Hydroxy (Vit-D Deficiency, Fractures)  Result Value Ref Range   VITD 31.99 30.00 - 100.00 ng/mL  TSH  Result Value Ref Range   TSH 2.49 0.35 - 5.50 uIU/mL  Comprehensive metabolic panel  Result Value Ref Range   Sodium 142 135 - 145 mEq/L   Potassium 3.9 3.5 - 5.1 mEq/L   Chloride 104 96 - 112 mEq/L   CO2 28 19 - 32 mEq/L   Glucose, Bld 215 (H) 70 - 99 mg/dL   BUN 11 6 - 23 mg/dL   Creatinine, Ser 0.87 0.40 - 1.20 mg/dL   Total Bilirubin 0.4 0.2 - 1.2 mg/dL   Alkaline Phosphatase 87 39 - 117 U/L   AST 16 0 - 37 U/L   ALT 16 0 - 35 U/L   Total Protein 6.2 6.0 - 8.3 g/dL   Albumin 3.9 3.5 - 5.2 g/dL   GFR 62.96 >60.00 mL/min   Calcium 9.3 8.4 - 10.5 mg/dL  Lipid panel  Result Value Ref Range   Cholesterol 178 0 - 200 mg/dL   Triglycerides 129.0 0.0 - 149.0 mg/dL   HDL 50.20 >39.00 mg/dL   VLDL 25.8 0.0 - 40.0 mg/dL   LDL Cholesterol 102 (H) 0 - 99 mg/dL   Total CHOL/HDL Ratio 4    NonHDL 128.28   Microalbumin / creatinine urine ratio  Result Value Ref Range   Microalb, Ur <0.7 0.0 - 1.9 mg/dL   Creatinine,U 65.0 mg/dL   Microalb Creat Ratio 1.1 0.0 - 30.0 mg/g  Hemoglobin A1c  Result Value Ref Range   Hgb A1c MFr Bld 11.4 (H) 4.6 - 6.5 %    Assessment & Plan:   Problem List Items Addressed This Visit     Hypothyroidism due to Hashimoto's thyroiditis   Relevant Medications   SYNTHROID 75 MCG tablet   Hyperlipidemia associated with type 2 diabetes mellitus (HCC)   Relevant Medications   insulin degludec (TRESIBA FLEXTOUCH) 100 UNIT/ML FlexTouch Pen     Meds ordered this encounter  Medications   Baclofen 5 MG TABS    Sig: Take 1-2 tablets (5-10 mg total) by mouth at bedtime as needed (neck pain).    Dispense:  30 tablet    Refill:  0    insulin degludec (TRESIBA FLEXTOUCH) 100 UNIT/ML FlexTouch Pen    Sig: Inject 60 Units into the skin at bedtime.    Dispense:  54 mL    Refill:  1   SYNTHROID 75 MCG tablet    Sig: TAKE ONE TABLET BY MOUTH Monday-Saturday BEFORE breakfast    Dispense:  80 tablet    Refill:  4   Cholecalciferol (VITAMIN D3) 25 MCG (1000 UT) CAPS    Sig: Take 2 capsules (2,000 Units total) by mouth daily.    No orders of the defined types were placed in this encounter.   Patient Instructions  For pain - continue  tylenol 523m three times daily, add baclofen muscle relaxant at night as well as try topiramate twice daily.  For diabetes - increase tresiba to 60 units daily. Work on diet.  Increase vitamin D to 2000 IU daily.  Good to see you today Return in 3 months for diabetes follow up visit   Follow up plan: Return in about 3 months (around 02/02/2023) for follow up visit.  JRia Bush MD

## 2022-11-05 ENCOUNTER — Encounter: Payer: Self-pay | Admitting: Family Medicine

## 2022-11-05 MED ORDER — BACLOFEN 5 MG PO TABS: 5.0000 mg | ORAL_TABLET | Freq: Every evening | ORAL | 3 refills | Status: DC | PRN

## 2022-11-05 MED ORDER — ROSUVASTATIN CALCIUM 20 MG PO TABS: 20.0000 mg | ORAL_TABLET | Freq: Every evening | ORAL | 4 refills | Status: DC

## 2022-11-05 NOTE — Assessment & Plan Note (Signed)
Stable period on Synthroid 32mg 6 days a week - continue this.

## 2022-11-05 NOTE — Assessment & Plan Note (Addendum)
Previously followed by endo Dr Cruzita Lederer. Chronic, deteriorated control with A1c up to 11.4%.  Continues tresiba 56u daily - stopped aspart insulin with improvement in headaches and cognition.  Having difficulty receiving data from [facility] CGM.  She has been unable to establish with endocrinologist. Will place new referral to [facility] to see if they're now accepting new patients.  She continues to struggle with brittle diabetes with significant postprandial hyperglycemia regardless of meal intake.  Will increase tresiba to 60u daily while she establishes with endo.  Has had diabetes education classes, has previously tried insulin pump.

## 2022-11-05 NOTE — Assessment & Plan Note (Addendum)
S/p LICA stent placement 2019 sees [facility] yearly Carlis Abbott).

## 2022-11-05 NOTE — Assessment & Plan Note (Signed)
Increase vit D replacement to 2000 IU daily .

## 2022-11-05 NOTE — Assessment & Plan Note (Signed)
Continue aspirin, plavix, rosuvastatin 67m daily.

## 2022-11-05 NOTE — Assessment & Plan Note (Signed)
Appreciate pulm care - s/p reassuring evaluation negative for pulm fibrosis, thought asthma and OSA pending starting CPAP.

## 2022-11-05 NOTE — Assessment & Plan Note (Addendum)
Ongoing pain.  Has established with PM&R s/p recent thoracic and cervical MRIs - has been recommended PT (planned next month) then f/u with PM&R. Appreciate Dr Sharlet Salina' clinic care.  Discussed baclofen muscle relaxant use as well as continued tylenol 524m TID. To let me know if this does not help control pain to consider tramadol vs topical treatments, consider lidoderm. Gabapentin, nortriptyline intolerance. Consider trial lyrica?

## 2022-11-05 NOTE — Assessment & Plan Note (Signed)
Previously discussed.

## 2022-11-05 NOTE — Assessment & Plan Note (Signed)
Preventative protocols reviewed and updated unless pt declined. Discussed healthy diet and lifestyle.  

## 2022-11-05 NOTE — Assessment & Plan Note (Signed)
New home sleep test showed ongoing sleep apnea, planning to start CPAP

## 2022-11-05 NOTE — Assessment & Plan Note (Signed)
This contributes to chronic pain

## 2022-11-05 NOTE — Assessment & Plan Note (Addendum)
Continue aspirin, statin and plavix. H/o monckeberg's medial sclerosis

## 2022-11-05 NOTE — Assessment & Plan Note (Addendum)
H/o this. Avoid steroids as able.

## 2022-11-05 NOTE — Assessment & Plan Note (Addendum)
Reviewed recent DEXA showing osteopenia range.  Rec increase vit D to 2000 IU daily, will encourage calcium in diet.

## 2022-11-05 NOTE — Assessment & Plan Note (Signed)
Chronic, stable on metoprolol, lasix, losartan - continue.

## 2022-11-05 NOTE — Assessment & Plan Note (Addendum)
Chronic, stable on crestor 69m daily. LDL 102, above goal. Zetia intolerance. Consider further treatment ie bempedoic acid vs PCSK9i. The ASCVD Risk score (Arnett DK, et al., 2019) failed to calculate for the following reasons:   The 2019 ASCVD risk score is only valid for ages 440to 733

## 2022-11-07 ENCOUNTER — Encounter: Payer: Self-pay | Admitting: Family Medicine

## 2022-11-10 ENCOUNTER — Encounter: Payer: Self-pay | Admitting: Family Medicine

## 2022-11-14 NOTE — Telephone Encounter (Signed)
Is there endocrinology available in [place]? If not, [facility] ([facility]) [facility]) may be best option if pt willing to drive to [facility].

## 2022-11-16 ENCOUNTER — Encounter: Payer: Self-pay | Admitting: Family Medicine

## 2022-11-16 ENCOUNTER — Emergency Department: Payer: PPO

## 2022-11-16 ENCOUNTER — Ambulatory Visit (INDEPENDENT_AMBULATORY_CARE_PROVIDER_SITE_OTHER): Payer: PPO | Admitting: Pulmonary Disease

## 2022-11-16 ENCOUNTER — Other Ambulatory Visit: Payer: Self-pay

## 2022-11-16 ENCOUNTER — Emergency Department
Admission: EM | Admit: 2022-11-16 | Discharge: 2022-11-16 | Disposition: A | Discharge status: Home / Self Care | Payer: PPO | Attending: Emergency Medicine | Admitting: Emergency Medicine

## 2022-11-16 ENCOUNTER — Encounter: Payer: Self-pay | Admitting: Pulmonary Disease

## 2022-11-16 VITALS — BP 118/62 | HR 78 | Temp 97.3°F | Ht 63.5 in | Wt 151.0 lb

## 2022-11-16 DIAGNOSIS — Z8679 Personal history of other diseases of the circulatory system: Secondary | ICD-10-CM

## 2022-11-16 DIAGNOSIS — R600 Localized edema: Secondary | ICD-10-CM | POA: Diagnosis not present

## 2022-11-16 DIAGNOSIS — I509 Heart failure, unspecified: Secondary | ICD-10-CM | POA: Insufficient documentation

## 2022-11-16 DIAGNOSIS — R0602 Shortness of breath: Secondary | ICD-10-CM | POA: Diagnosis not present

## 2022-11-16 DIAGNOSIS — R0789 Other chest pain: Secondary | ICD-10-CM | POA: Diagnosis not present

## 2022-11-16 DIAGNOSIS — R0609 Other forms of dyspnea: Secondary | ICD-10-CM | POA: Diagnosis not present

## 2022-11-16 DIAGNOSIS — Z0389 Encounter for observation for other suspected diseases and conditions ruled out: Secondary | ICD-10-CM | POA: Diagnosis not present

## 2022-11-16 LAB — CBC
HCT: 44.2 % (ref 36.0–46.0)
Hemoglobin: 14.2 g/dL (ref 12.0–15.0)
MCH: 27.3 pg (ref 26.0–34.0)
MCHC: 32.1 g/dL (ref 30.0–36.0)
MCV: 84.8 fL (ref 80.0–100.0)
Platelets: 224 10*3/uL (ref 150–400)
RBC: 5.21 MIL/uL — ABNORMAL HIGH (ref 3.87–5.11)
RDW: 13.8 % (ref 11.5–15.5)
WBC: 8.6 10*3/uL (ref 4.0–10.5)
nRBC: 0 % (ref 0.0–0.2)

## 2022-11-16 LAB — BASIC METABOLIC PANEL
Anion gap: 4 — ABNORMAL LOW (ref 5–15)
BUN: 15 mg/dL (ref 8–23)
CO2: 24 mmol/L (ref 22–32)
Calcium: 9.7 mg/dL (ref 8.9–10.3)
Chloride: 110 mmol/L (ref 98–111)
Creatinine, Ser: 0.93 mg/dL (ref 0.44–1.00)
GFR, Estimated: >60 mL/min (ref >60)
Glucose, Bld: 210 mg/dL — ABNORMAL HIGH (ref 70–99)
Potassium: 4.5 mmol/L (ref 3.5–5.1)
Sodium: 138 mmol/L (ref 135–145)

## 2022-11-16 LAB — BRAIN NATRIURETIC PEPTIDE: B Natriuretic Peptide: 59 pg/mL (ref 0.0–100.0)

## 2022-11-16 LAB — TROPONIN I (HIGH SENSITIVITY)
Troponin I (High Sensitivity): 3 ng/L (ref <18)
Troponin I (High Sensitivity): 4 ng/L (ref <18)

## 2022-11-16 NOTE — ED Triage Notes (Addendum)
Pt here with weakness and chest pain intermittently but stronger yesterday. Pt states pain is centered and radiates across her entire chest. Pt states pain is sever and got up to 6/10. Pt has hx of CHF. Pt went to her primary for a follow up and was told to come to the ED for evaluation of her cp.

## 2022-11-16 NOTE — ED Provider Notes (Signed)
[facility] Provider Note   Event Date/Time   First MD Initiated Contact with Patient 11/16/22 1413     (approximate) History  Weakness and Chest Pain  HPI Cassandra Benson is a 82 y.o. female with stated past medical history of heart failure presents for worsening dyspnea on exertion.  Patient states that she has had worsening exertional tolerance over the last month despite any worsening bilateral lower extremity edema or any change in her medications.  Patient states that she sees her cardiology once a year and is concerned that her heart failure may be worsening. ROS: Patient currently denies any vision changes, tinnitus, difficulty speaking, facial droop, sore throat, chest pain, abdominal pain, nausea/vomiting/diarrhea, dysuria, or weakness/numbness/paresthesias in any extremity   Physical Exam  Triage Vital Signs: ED Triage Vitals  Enc Vitals Group     BP 11/16/22 1105 (!) 140/65     Pulse Rate 11/16/22 1105 67     Resp 11/16/22 1105 18     Temp 11/16/22 1105 97.8 F (36.6 C)     Temp Source 11/16/22 1105 Oral     SpO2 11/16/22 1105 97 %     Weight 11/16/22 1106 151 lb 0.2 oz (68.5 kg)     Height 11/16/22 1106 5' 3.5" (1.613 m)     Head Circumference --      Peak Flow --      Pain Score 11/16/22 1106 4     Pain Loc --      Pain Edu? --      Excl. in [place]? --    Most recent vital signs: Vitals:   11/16/22 1105 11/16/22 1416  BP: (!) 140/65 (!) 163/71  Pulse: 67 72  Resp: 18 15  Temp: 97.8 F (36.6 C)   SpO2: 97% 99%   General: Awake, oriented x4. CV:  Good peripheral perfusion.  Resp:  Normal effort.  Abd:  No distention.  Other:  Elderly Caucasian overweight female laying in bed in no acute distress.  1+ pitting edema to bilateral lower extremities ED Results / Procedures / Treatments  Labs (all labs ordered are listed, but only abnormal results are displayed) Labs Reviewed  BASIC METABOLIC PANEL - Abnormal; Notable for the  following components:      Result Value   Glucose, Bld 210 (*)    Anion gap 4 (*)    All other components within normal limits  CBC - Abnormal; Notable for the following components:   RBC 5.21 (*)    All other components within normal limits  TROPONIN I (HIGH SENSITIVITY)  TROPONIN I (HIGH SENSITIVITY)   EKG ED ECG REPORT I, Naaman Plummer, the attending physician, personally viewed and interpreted this ECG. Date: 11/16/2022 EKG Time: 1109 Rate: 67 Rhythm: normal sinus rhythm QRS Axis: normal Intervals: normal ST/T Wave abnormalities: normal Narrative Interpretation: no evidence of acute ischemia RADIOLOGY ED MD interpretation: 2 view chest x-ray interpreted by me shows no evidence of acute abnormalities including no pneumonia, pneumothorax, or widened mediastinum -Agree with radiology assessment Official radiology report(s): DG Chest 2 View  Result Date: 11/16/2022 CLINICAL DATA:  66-year-old female with history of chest pain EXAM: CHEST - 2 VIEW COMPARISON:  08/16/2022, 04/11/2022 FINDINGS: The mediastinal contours are within normal limits. No cardiomegaly. The lungs are clear bilaterally without evidence of focal consolidation, pleural effusion, or pneumothorax. No acute osseous abnormality. IMPRESSION: No acute cardiopulmonary process. Electronically Signed   By: Ruthann Cancer M.D.   On: 11/16/2022 12:24  PROCEDURES: Critical Care performed: No Procedures MEDICATIONS ORDERED IN ED: Medications - No data to display IMPRESSION / MDM / [place] / ED COURSE  I reviewed the triage vital signs and the nursing notes.                             The patient is on the cardiac monitor to evaluate for evidence of arrhythmia and/or significant heart rate changes. Patient's presentation is most consistent with acute presentation with potential threat to life or bodily function. The patient is suffering from shortness of breath, but the immediate cause is not  apparent.  Potential causes considered include, but are not limited to, asthma or COPD, congestive heart failure, pulmonary embolism, pneumothorax, coronary syndrome, pneumonia, and pleural effusion.  Despite the evaluation including history, exam, and testing, the cause of the shortness of breath remains unclear. However, given patient's history of heart failure, this is likely the cause of patient's dyspnea on exertion and was encouraged to follow-up at heart failure clinic with [facility]  Patient will be discharged with strict return precautions and advice to follow up with primary MD within 24 hours for further evaluation. Clinical Course as of 11/16/22 1603  Wed Nov 16, 2022  1451 DG Chest 2 View [EB]    Clinical Course User Index [EB] Cheri Fowler Vista Lawman, MD   FINAL CLINICAL IMPRESSION(S) / ED DIAGNOSES   Final diagnoses:  None   Rx / DC Orders   ED Discharge Orders     None      Note:  This document was prepared using Dragon voice recognition software and may include unintentional dictation errors.   Naaman Plummer, MD 11/16/22 [phone]

## 2022-11-16 NOTE — ED Notes (Signed)
Pt upset about wait time. Pt states her doctor told her she would be admitted but she is just waiting. RN explained to pt about the wait and we are working on getting her a room. Pt ambulatory in waiting room with no distress.

## 2022-11-16 NOTE — Progress Notes (Signed)
Subjective:    Patient ID: Cassandra Benson, female    DOB: 02/03/1941, 82 y.o.   MRN: [id number] Patient Care Team: [name], MD as PCP - General (Family Medicine) Minna Merritts, MD as PCP - Cardiology (Cardiology) Birder Robson, MD as Referring Physician (Ophthalmology)  Chief Complaint  Patient presents with   Follow-up    SOB. Nose Bleeds. Discomfort in her chest. Weakness. Dry cough.     HPI Patient presents to clinic today initially noted to be with significant work of breathing.  Notes significant chest pressure, shortness of breath and epistaxis for the last few days.  She also notices a dry cough.  Does not endorse any fevers, chills or sweats at home.  Followed here for the issue of dyspnea however no significant pulmonary abnormality has been found to explain the symptom.  The has multiple known pulmonary complaints.  She states she feels that she cannot "go any longer" fatigue, chest pressure as noted above.   DATA 12/24/2021 echocardiogram: LVEF 60 to 65%, grade 1 DD, normal right ventricular systolic function.  Normal pulmonary artery systolic pressure.  Mild MR. 04/11/2022 CT chest high-resolution: Mild pulmonary fibrosis pattern with apical to basal gradient, no significant air trapping on expiratory phase.  Not consistent with UIP.  Findings are very mild. 04/22/2022 PFTs: FEV1 1.91 L or 101% predicted, FVC 2.98 L or 117% predicted, FEV1/FVC 64%.  No bronchodilator response, lung volumes normal.  Mild diffusion capacity impairment.  Consistent with mild obstructive airways disease.  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 08/01/2022 connective tissue disease panel, acetylcholine receptor assay: Negative. 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  Patient declined CPAP/oxygen.  Review of Systems A 10 point review of systems was performed and it is as noted  above otherwise negative.  Patient Active Problem List   Diagnosis Date Noted   Allergy to beta blocker 08/06/2022   Steroid-induced hyperglycemia 05/16/2022   Right thigh pain 05/16/2022   Dysuria 02/25/2022   Body aches 02/15/2022   Insomnia 02/01/2022   Osteopenia 01/15/2022   Chronic dyspnea 12/22/2021   Unsteadiness 02/13/2021   Atherosclerosis of aorta ([place]) 12/16/2020   Neck pain 12/03/2020   Fall with injury 09/01/2020   Grade II diastolic dysfunction [id number]   Chronic venous insufficiency 04/07/2020   Partial small bowel obstruction ([place]) 01/15/2020   Epistaxis 01/13/2020   Cognitive changes 10/22/2019   Right sided abdominal pain 10/07/2019   Skin lesion 09/14/2019   Renal insufficiency 09/14/2019   Pain in right buttock 06/18/2019   Chronic midline thoracic back pain 05/14/2019   Sjogren's syndrome without extraglandular involvement ([place]) 02/26/2019   Epigastric discomfort 12/17/2018   PAD (peripheral artery disease) ([place]) 10/04/2018   Raynaud disease 09/04/2018   Amaurosis fugax, both eyes 06/18/2018   TIA (transient ischemic attack) 05/21/2018   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   Facial paresthesia 02/07/2018   Pain and swelling of lower leg 12/25/2017   Fibromyalgia    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), bilateral 08/21/2017   Chronic right-sided headache 03/13/2017   Numbness and tingling of both lower extremities 03/13/2017   6th nerve palsy, 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   Fatty liver 02/13/2017   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 ([place]) 10/03/2016   Acute pain of right shoulder 08/15/2016   Medicare annual wellness visit, subsequent 06/19/2015   Health maintenance examination 06/19/2015   Advanced care planning/counseling discussion 06/19/2015  Carotid stenosis, 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 06/19/2015   Vitamin D deficiency    Family history of premature CAD 06/02/2015   Chest pain 05/26/2015    Elevated testosterone level in female 09/04/2014   Hyperlipidemia associated with type 2 diabetes mellitus ([place]) 07/30/2014   Essential hypertension 07/17/2014   Hypothyroidism due to Hashimoto's thyroiditis 07/17/2014   OSA (obstructive sleep apnea) 08/22/2012   Chronic low back pain 02/27/2012   Positive ANA (antinuclear antibody) 01/06/2012   Social History   Tobacco Use   Smoking status: Never   Smokeless tobacco: Never  Substance Use Topics   Alcohol use: No   Allergies  Allergen Reactions   Iodinated Contrast Media Other (See Comments)    Itching (severe) and chest tightness   Penicillins Anaphylaxis, Swelling, Rash and Other (See Comments)    Has patient had a PCN reaction causing immediate rash, facial/tongue/throat swelling, SOB or lightheadedness with hypotension: Yes Has patient had a PCN reaction causing severe rash involving mucus membranes or skin necrosis: yes - remotely Has patient had a PCN reaction that required hospitalization: occurred while hospitalized Has patient had a PCN reaction occurring within the last 10 years: No If all of the above answers are "NO", then may proceed with Cephalosporin use.    Amlodipine Swelling    Pedal edema   Anectine [Succinylcholine] Other (See Comments)    Brother went into cardiac arrest.   Carvedilol Dermatitis    Facial acne   Codeine Nausea Only   Gabapentin Other (See Comments)    Gait abnormality   Influenza Vaccines Other (See Comments)    Muscle weakness; unable to walk   Insulin Aspart Other (See Comments)    headache   Nortriptyline Other (See Comments)    Eye swelling and mouth drawed up   Prednisone     Marked severe hyperglycemia to oral and IM steroids   Valsartan Other (See Comments) and Cough    Allergy to generic only, "Hacking" cough   Zetia [Ezetimibe] Other (See Comments)    Bad muscle cramps   Erythromycin Rash and Swelling   Sulfa Antibiotics Rash   No outpatient medications have been marked  as taking for the 11/16/22 encounter (Office Visit) with Tyler Pita, MD.  *Patient taken to the ED due to continued complaints of chest pressure and shortness of breath.     Objective:   Physical Exam BP 118/62 (BP Location: Left Arm, Cuff Size: Normal)   Pulse 78   Temp (!) 97.3 F (36.3 C)   Ht 5' 3.5" (1.613 m)   Wt 151 lb (68.5 kg) Comment: Per last office visit. patient is in a wheelchair and can not weigh.  SpO2 100%   BMI 26.33 kg/m   SpO2: 100 % O2 Device: None (Room air)  GENERAL: Well-developed, well-nourished woman, no acute distress.  Fully ambulatory mild conversational dyspnea. Voice changes during interview. Occasionally aphonic. HEAD: Normocephalic, atraumatic.  EYES: Pupils equal, round, reactive to light.  No scleral icterus.  MOUTH: Oral mucosa moist.  No thrush. NECK: Supple. No thyromegaly. Trachea midline. No JVD.  No adenopathy. PULMONARY: Good air entry bilaterally.  No adventitious sounds. CARDIOVASCULAR: S1 and S2. Regular rate and rhythm.  No rubs, murmurs or gallops heard. ABDOMEN: Protuberant, otherwise benign. MUSCULOSKELETAL: No joint deformity, no clubbing, no edema.  NEUROLOGIC: No overt focal deficit, no gait disturbance, speech is fluent. SKIN: Intact,warm,dry.  No overt rashes, exam limited. PSYCH: Anxious,tearful.     Assessment & Plan:  ICD-10-CM   1. Chest pressure  R07.89    Needs evaluation in the ED Oxygen saturations in clinic at 100% Do not feel pulmonary in etiology    2. Shortness of breath  R06.02    Do not feel this is pulmonary in etiology Extensive pulmonary workup has been negative thus far.     Because of continued issues with chest pressure and shortness of breath patient was transferred to the emergency room for further evaluation.  Renold Don, MD Advanced Bronchoscopy PCCM [facility]    *This note was dictated using voice recognition software/Dragon.  Despite best efforts to  proofread, errors can occur which can change the meaning. Any transcriptional errors that result from this process are unintentional and may not be fully corrected at the time of dictation.

## 2022-11-17 ENCOUNTER — Telehealth: Payer: Self-pay

## 2022-11-17 ENCOUNTER — Encounter: Payer: Self-pay | Admitting: Family Medicine

## 2022-11-17 NOTE — Transitions of Care (Post Inpatient/ED Visit) (Signed)
   11/17/2022  Name: Cassandra Benson MRN: [id number] DOB: 1941-05-21  Today's TOC FU Call Status: Today's TOC FU Call Status:: Successful TOC FU Call Competed TOC FU Call Complete Date: 11/17/22  Transition Care Management Follow-up Telephone Call Date of Discharge: 11/16/22 Discharge Facility: [facility]) Type of Discharge: Emergency Department Reason for ED Visit: Other: (Dyspnea on exertion) How have you been since you were released from the hospital?: Better Any questions or concerns?: No  Items Reviewed: Did you receive and understand the discharge instructions provided?: Yes Medications obtained and verified?: Yes (Medications Reviewed) Any new allergies since your discharge?: No Dietary orders reviewed?: NA Do you have support at home?: Yes  Home Care and Equipment/Supplies: [place] Ordered?: NA Any new equipment or medical supplies ordered?: NA  Functional Questionnaire: Do you need assistance with bathing/showering or dressing?: No Do you need assistance with meal preparation?: No Do you need assistance with eating?: No Do you have difficulty maintaining continence: No Do you need assistance with getting out of bed/getting out of a chair/moving?: No Do you have difficulty managing or taking your medications?: No  Folllow up appointments reviewed: PCP Follow-up appointment confirmed?: No (pt needs to be seen 11-18-22 but no openings- will request front desk to call pt to work in schedule) MD Provider Line [phone] Given: Yes [facility] Follow-up appointment confirmed?: Yes Date of Specialist follow-up appointment?: 11/18/22 Follow-Up Specialty Provider:: Darylene Price Do you need transportation to your follow-up appointment?: No Do you understand care options if your condition(s) worsen?: Yes-patient verbalized understanding    [place] LPN [place] [facility]  Dial [phone]

## 2022-11-18 ENCOUNTER — Encounter: Payer: Self-pay | Admitting: Pharmacist

## 2022-11-18 ENCOUNTER — Ambulatory Visit: Payer: Self-pay

## 2022-11-18 ENCOUNTER — Encounter: Payer: Self-pay | Admitting: Family

## 2022-11-18 ENCOUNTER — Ambulatory Visit: Payer: PPO | Attending: Family | Admitting: Family

## 2022-11-18 VITALS — BP 135/75 | HR 83 | Resp 20 | Wt 154.1 lb

## 2022-11-18 DIAGNOSIS — I110 Hypertensive heart disease with heart failure: Secondary | ICD-10-CM | POA: Insufficient documentation

## 2022-11-18 DIAGNOSIS — I5032 Chronic diastolic (congestive) heart failure: Secondary | ICD-10-CM

## 2022-11-18 DIAGNOSIS — G4733 Obstructive sleep apnea (adult) (pediatric): Secondary | ICD-10-CM

## 2022-11-18 DIAGNOSIS — M797 Fibromyalgia: Secondary | ICD-10-CM | POA: Insufficient documentation

## 2022-11-18 DIAGNOSIS — E139 Other specified diabetes mellitus without complications: Secondary | ICD-10-CM | POA: Diagnosis not present

## 2022-11-18 DIAGNOSIS — R519 Headache, unspecified: Secondary | ICD-10-CM | POA: Diagnosis not present

## 2022-11-18 DIAGNOSIS — M3500 Sicca syndrome, unspecified: Secondary | ICD-10-CM | POA: Diagnosis not present

## 2022-11-18 DIAGNOSIS — R0789 Other chest pain: Secondary | ICD-10-CM | POA: Diagnosis not present

## 2022-11-18 DIAGNOSIS — I10 Essential (primary) hypertension: Secondary | ICD-10-CM | POA: Diagnosis not present

## 2022-11-18 DIAGNOSIS — E039 Hypothyroidism, unspecified: Secondary | ICD-10-CM | POA: Diagnosis not present

## 2022-11-18 DIAGNOSIS — Z8616 Personal history of COVID-19: Secondary | ICD-10-CM | POA: Insufficient documentation

## 2022-11-18 DIAGNOSIS — I509 Heart failure, unspecified: Secondary | ICD-10-CM | POA: Insufficient documentation

## 2022-11-18 DIAGNOSIS — I2510 Atherosclerotic heart disease of native coronary artery without angina pectoris: Secondary | ICD-10-CM | POA: Insufficient documentation

## 2022-11-18 DIAGNOSIS — E785 Hyperlipidemia, unspecified: Secondary | ICD-10-CM | POA: Insufficient documentation

## 2022-11-18 DIAGNOSIS — R42 Dizziness and giddiness: Secondary | ICD-10-CM | POA: Diagnosis not present

## 2022-11-18 DIAGNOSIS — Z79899 Other long term (current) drug therapy: Secondary | ICD-10-CM | POA: Diagnosis not present

## 2022-11-18 DIAGNOSIS — R0602 Shortness of breath: Secondary | ICD-10-CM | POA: Insufficient documentation

## 2022-11-18 MED ORDER — TORSEMIDE 20 MG PO TABS: 20.0000 mg | ORAL_TABLET | Freq: Two times a day (BID) | ORAL | 5 refills | Status: DC

## 2022-11-18 MED ORDER — TORSEMIDE 20 MG PO TABS: 20.0000 mg | ORAL_TABLET | Freq: Every day | ORAL | 5 refills | Status: DC

## 2022-11-18 NOTE — Telephone Encounter (Signed)
There is not a location in [place] accepting patients, the only Endo Provider in [place] is with [facility] and they are not accepting new patients.   I can send this to [facility] Endocrinology

## 2022-11-18 NOTE — Progress Notes (Signed)
Patient ID: Cassandra Benson, female    DOB: 10/25/1940, 82 y.o.   MRN: [id number]  HPI  Cassandra Benson is a 82 yo female with a PMHx significant for HTN, HLD, hypothyroidism, CAD, LADA, HA's, fibromyalgia, PAD, Sjogren's syndrome, and CHF.   Echo report from 12/24/21 reviewed and showed an EF of 60-65% along with mild MR. Echo from 07/30/20 reviewed and showed an EF of 55-60% with grade II diastolic dysfunction.   Was in the ED 11/16/22 due to SOB due to HF exacerbation.   She presents today for a ED f/u visit with a chief complaint of moderate SOB with little exertion. Describes this as chronic in nature. Has associated fatigue, abdominal distention, pedal edema, chest tightness, wheezing, dizziness and headaches along with this. Denies any difficulty sleeping, abdominal distention, palpitations, chest pain or weight gain.   Was frustrated with her recent ED visit.   Past Medical History:  Diagnosis Date   Acute diverticulitis 04/2021   [facility] ER, CT confirmed   Allergy    ANA positive    positive ANA pattern 1 speckled   Arthritis    Carotid stenosis, asymptomatic 06/19/2015   123456 RICA 123456 LICA rpt 1 yr ([id number])    CHF (congestive heart failure) ([place])    Colon polyps    COVID-19 virus infection 09/14/2021   Dermatomyositis ([place])    Diabetes mellitus without complication ([place])    Type 1   Diverticulosis    sigmoid on CT scan 12/2019   Family history of adverse reaction to anesthesia    brothr went into cardiac arrest from anectine   Fibromyalgia    prior PCP   GERD (gastroesophageal reflux disease)    prior PCP   Glaucoma    Narrow angle   History of blood clots    DVT, in 20s, none since   History of chicken pox    History of diverticulitis    History of pericarditis 1986   with hospitalization   History of pneumonia 2014   History of shingles    History of UTI    Hyperlipidemia    Hypertension    Hypothyroidism    Mixed connective tissue disease ([place])     Partial small bowel obstruction ([place]) 12/2019   managed conservatively   Peptic ulcer    Pneumonia    PONV (postoperative nausea and vomiting)    Raynaud's disease without gangrene    Shoulder pain left   h/o RTC tendonitis and adhesive capsulitis   Sigmoid diverticulitis 05/26/2021   Sjogren's syndrome ([place])    Sleep apnea    prior PCP - no CPAP for about 10 yrs   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[place])    Vitamin D deficiency    prior PCP   Past Surgical History:  Procedure Laterality Date   ABDOMINAL HYSTERECTOMY  1978   fibroids and menorrhagia, ovaries remain   ARTERY BIOPSY Right 04/06/2018   Procedure: BIOPSY TEMPORAL ARTERY RIGHT;  Surgeon: Beverly Gust, MD;  Location: [facility];  Service: ENT;  Laterality: Right;  Diabetic - insulin pump sleep apnea   [facility] normal per patient   COLONOSCOPY  10/2011   1 TA, 1 HP, very tortuous colon ([place])   COLONOSCOPY  02/2020   TA, inflammatory polyp, mod diverticulosis, int/ext hemorrhoids ([place]) no rpt recommended    COLONOSCOPY WITH ESOPHAGOGASTRODUODENOSCOPY (EGD)  03/2007   2 ulcers, benign polyp, rpt 5 yrs [facility] [place])   JOINT REPLACEMENT  Right    hip   PARTIAL HIP ARTHROPLASTY  2013   Right hip replacement   TONSILLECTOMY     TONSILLECTOMY AND ADENOIDECTOMY     TRANSCAROTID ARTERY REVASCULARIZATION  Left 07/23/2018   Procedure: TRANSCAROTID ARTERY REVASCULARIZATION;  Surgeon: Marty Heck, MD;  Location: [facility] OR;  Service: Vascular;  Laterality: Left;   TUBAL LIGATION     VAGINAL DELIVERY     x2, no complications   Family History  Problem Relation Age of Onset   CAD Mother 67       MI, aortic valve issues   COPD Mother    Lupus Mother    [name] Mother    Rheum arthritis Mother    CAD Father 37       CABG x2, aortic valve replacement   Stroke Sister    CAD Sister    17 Sister        brain   Lupus Sister    Diabetes Sister    Diabetes  Sister    Breast cancer Sister    Alcohol abuse Brother    CAD Brother 43       MI   Stroke Brother    COPD Brother        agent orange   CAD Brother 77       stent   Diabetes Brother    Stroke Maternal Grandmother    Hypertension Maternal Grandmother    Gallbladder disease Maternal Grandmother    Breast cancer Maternal Aunt    Breast cancer Maternal Aunt    Depression Grandchild    Colon cancer Neg Hx    Esophageal cancer Neg Hx    Rectal cancer Neg Hx    Stomach cancer Neg Hx    Social History   Tobacco Use   Smoking status: Never   Smokeless tobacco: Never  Substance Use Topics   Alcohol use: No   Allergies  Allergen Reactions   Iodinated Contrast Media Other (See Comments)    Itching (severe) and chest tightness   Penicillins Anaphylaxis, Swelling, Rash and Other (See Comments)    Has patient had a PCN reaction causing immediate rash, facial/tongue/throat swelling, SOB or lightheadedness with hypotension: Yes Has patient had a PCN reaction causing severe rash involving mucus membranes or skin necrosis: yes - remotely Has patient had a PCN reaction that required hospitalization: occurred while hospitalized Has patient had a PCN reaction occurring within the last 10 years: No If all of the above answers are "NO", then may proceed with Cephalosporin use.    Amlodipine Swelling    Pedal edema   Anectine [Succinylcholine] Other (See Comments)    Brother went into cardiac arrest.   Carvedilol Dermatitis    Facial acne   Codeine Nausea Only   Gabapentin Other (See Comments)    Gait abnormality   Influenza Vaccines Other (See Comments)    Muscle weakness; unable to walk   Insulin Aspart Other (See Comments)    headache   Nortriptyline Other (See Comments)    Eye swelling and mouth drawed up   Prednisone     Marked severe hyperglycemia to oral and IM steroids   Valsartan Other (See Comments) and Cough    Allergy to generic only, "Hacking" cough   Zetia  [Ezetimibe] Other (See Comments)    Bad muscle cramps   Erythromycin Rash and Swelling   Sulfa Antibiotics Rash   Prior to Admission medications   Medication Sig Start Date End Date  Taking? Authorizing Provider  acetaminophen (TYLENOL) 500 MG tablet Take 1,500 mg by mouth daily. 01/03/20  Yes [name], MD  albuterol (VENTOLIN HFA) 108 (90 Base) MCG/ACT inhaler Inhale 2 puffs into the lungs every 6 (six) hours as needed. 08/01/22  Yes Tyler Pita, MD  Ascorbic Acid (VITAMIN C) 100 MG tablet Take 100 mg by mouth daily.   Yes [provider]  aspirin EC 81 MG tablet Take 1 tablet (81 mg total) by mouth daily. Swallow whole. 05/04/22  Yes [name], MD  Baclofen 5 MG TABS Take 1-2 tablets (5-10 mg total) by mouth at bedtime as needed (neck pain). 11/05/22  Yes [name], MD  Cholecalciferol (VITAMIN D3) 25 MCG (1000 UT) CAPS Take 2 capsules (2,000 Units total) by mouth daily. 11/04/22  Yes [name], MD  clopidogrel (PLAVIX) 75 MG tablet TAKE ONE TABLET BY MOUTH EVERY EVENING 03/17/22  Yes Angelia Mould, MD  diclofenac Sodium (VOLTAREN) 1 % GEL Apply 2 g topically 3 (three) times daily. 06/22/22  Yes [name], MD  furosemide (LASIX) 20 MG tablet TAKE ONE TABLET BY MOUTH ONCE DAILY 09/08/22  Yes Gollan, Kathlene November, MD  insulin degludec (TRESIBA FLEXTOUCH) 100 UNIT/ML FlexTouch Pen Inject 60 Units into the skin at bedtime. 11/04/22  Yes [name], MD  losartan (COZAAR) 25 MG tablet Take 1 tablet (25 mg total) by mouth daily. 03/15/22  Yes Minna Merritts, MD  metoprolol succinate (TOPROL XL) 25 MG 24 hr tablet Take 1 tablet (25 mg total) by mouth daily. 08/03/22  Yes Darylene Price A, FNP  Multiple Vitamin (MULTIVITAMIN ADULT) TABS Take 1 tablet by mouth in the morning and at bedtime. 01/18/22  Yes [name], MD  rosuvastatin (CRESTOR) 20 MG tablet Take 1 tablet (20 mg total) by mouth every evening. 11/05/22  Yes [name], MD   SYNTHROID 75 MCG tablet TAKE ONE TABLET BY MOUTH Monday-Saturday BEFORE breakfast 11/04/22  Yes [name], MD  topiramate (TOPAMAX) 50 MG tablet Take 1 tablet (50 mg total) by mouth 2 (two) times daily. 09/08/21  Yes Penumalli, Earlean Polka, MD  [place] 200-62.5-25 MCG/ACT AEPB INHALE 1 PUFF BY MOUTH INTO LUNGS ONCE daily 10/12/22  Yes Tyler Pita, MD  Glucagon, rDNA, (GLUCAGON EMERGENCY) 1 MG KIT INJECT into THE muscle ONCE AS NEEDED FOR emergency Patient not taking: Reported on 11/18/2022 11/18/21   Philemon Kingdom, MD    Review of Systems  Constitutional:  Positive for fatigue (easily). Negative for appetite change.  HENT:  Positive for voice change (raspy voice at times). Negative for congestion, postnasal drip and sore throat.   Eyes: Negative.   Respiratory:  Positive for chest tightness, shortness of breath (easily) and wheezing (yesterday).   Cardiovascular:  Positive for leg swelling. Negative for chest pain and palpitations.  Gastrointestinal:  Positive for abdominal distention. Negative for abdominal pain.  Endocrine: Negative.   Genitourinary: Negative.   Musculoskeletal:  Positive for arthralgias (right hip) and back pain.  Skin: Negative.   Allergic/Immunologic: Negative.   Neurological:  Positive for dizziness, weakness, light-headedness and headaches.  Hematological:  Negative for adenopathy. Does not bruise/bleed easily.  Psychiatric/Behavioral:  Negative for dysphoric mood and sleep disturbance (sleeping on 1 pillow; wakes up some during the night for "no rhyme or reason"). The patient is not nervous/anxious.    Vitals:   11/18/22 1152  BP: 135/75  Pulse: 83  Resp: 20  SpO2: 99%  Weight: 154 lb 2 oz (69.9 kg)  Wt Readings from Last 3 Encounters:  11/18/22 154 lb 2 oz (69.9 kg)  11/16/22 151 lb 0.2 oz (68.5 kg)  11/16/22 151 lb (68.5 kg)   Lab Results  Component Value Date   CREATININE 0.93 11/16/2022   CREATININE 0.87 10/28/2022    CREATININE 0.95 08/16/2022   Physical Exam Vitals and nursing note reviewed.  Constitutional:      Appearance: Normal appearance.  HENT:     Head: Normocephalic and atraumatic.  Cardiovascular:     Rate and Rhythm: Normal rate and regular rhythm.  Pulmonary:     Effort: Pulmonary effort is normal. No respiratory distress.     Breath sounds: No wheezing or rales.  Abdominal:     General: There is distension.     Palpations: Abdomen is soft.  Musculoskeletal:        General: No tenderness.     Cervical back: Normal range of motion and neck supple.     Right lower leg: Edema (1+ pitting) present.     Left lower leg: Edema (1+ pitting) present.  Skin:    General: Skin is warm and dry.  Neurological:     General: No focal deficit present.     Mental Status: She is alert and oriented to person, place, and time.  Psychiatric:        Mood and Affect: Mood normal.        Behavior: Behavior normal.        Thought Content: Thought content normal.     Assessment and Plan:  Heart failure with preserved EF- - NYHA III - mildly fluid overloaded with worsening pedal edema and abdominal distention - weighing daily; reminded to call for an overnight weight gain of >2 pounds or a weekly weight gain of >5 pounds - weight stable from last visit here 4 months ago - adhering to low sodium diet and fluid restriction - losartan '25mg'$  daily - metoprolol succinate '25mg'$  daily - furosemide '20mg'$  daily - valsartan caused hacking cough - can not add SGLT2 due to LADA - stop furosemide and begin torsemide '20mg'$  daily - BMP next week - discussed referral to [facility] provider & she is interested so this was scheduled for next week - saw cardiology Rockey Situ) 03/15/22 - BNP 11/16/22 was 59.0 - PharmD reconciled meds w/ patient  2. HTN - BP 135/75 - saw PCP Danise Mina) 11/04/22 - BMP 11/16/22 reviewed: sodium 138, potassium 4.5, creatinine 0.93 & GFR >60   3. LADA - saw endocrinology Renne Crigler on 11/09/21) -  A1c 10/28/22 was 11.4%  4: Mild obstructive sleep apnea - saw pulmonology Patsey Berthold) 11/16/22 - home sleep study done 09/22/22 which showed mild obstructive sleep apnea with AHI of 8.5 and oxygen saturations as low as 82% nocturnally. Patient declined CPAP/oxygen.  - PFT's 04/22/22   Medication list reviewed.   Return in 1 week, sooner if needed

## 2022-11-18 NOTE — Patient Instructions (Addendum)
Stop taking furosemide and begin torsemide '20mg'$  daily.

## 2022-11-18 NOTE — Chronic Care Management (AMB) (Signed)
   11/18/2022  Cassandra Benson 12/28/1940 [id number]   Case closure/Change in enrollment status.     Horris Latino RN Care Manager/Chronic Care Management [phone]

## 2022-11-18 NOTE — Telephone Encounter (Signed)
See other mychart message.

## 2022-11-21 ENCOUNTER — Encounter: Payer: Self-pay | Admitting: Family Medicine

## 2022-11-21 ENCOUNTER — Ambulatory Visit (INDEPENDENT_AMBULATORY_CARE_PROVIDER_SITE_OTHER): Payer: PPO | Admitting: Family Medicine

## 2022-11-21 VITALS — BP 134/70 | HR 99 | Temp 97.4°F | Ht 63.5 in | Wt 150.5 lb

## 2022-11-21 DIAGNOSIS — R0609 Other forms of dyspnea: Secondary | ICD-10-CM | POA: Diagnosis not present

## 2022-11-21 DIAGNOSIS — E13319 Other specified diabetes mellitus with unspecified diabetic retinopathy without macular edema: Secondary | ICD-10-CM | POA: Diagnosis not present

## 2022-11-21 DIAGNOSIS — I5189 Other ill-defined heart diseases: Secondary | ICD-10-CM | POA: Diagnosis not present

## 2022-11-21 NOTE — Progress Notes (Unsigned)
Patient ID: Cassandra Benson, female    DOB: 09-20-1941, 82 y.o.   MRN: [id number]  This visit was conducted in person.  BP 134/70   Pulse 99   Temp (!) 97.4 F (36.3 C) (Temporal)   Ht 5' 3.5" (1.613 m)   Wt 150 lb 8 oz (68.3 kg)   SpO2 96%   BMI 26.24 kg/m    CC: ER f/u visit  Subjective:   HPI: Cassandra Benson is a 82 y.o. female presenting on 11/21/2022 for Hospitalization Follow-up (Seen on 11/16/22 at [facility] ED, dx DOE. )   Recent ER visit 11/16/2022 after she presented to pulmonology appointment with chest pressure, shortness of breath and increased work of breathing. ER workup revealed elevated sugar to 210, no anemia, normal BNP and normal cardiac enzymes x2, and normal CXR, EKG. She felt abdomen very distended at that time as well.   She saw CHF clinic in follow up last Friday - known chronic diastolic CHF, diuretic changed from furosemide '20mg'$  to torsemide '20mg'$  daily. Chronic dyspnea with minimal exertion. Pulm doesn't feel dyspnea is a lung issue. Planning to see advanced CHF provider Dr Daniel Nones on Friday.   LADA - still pending referral to endocrinologist - difficult to get scheduled as multiple offices are not accepting new patients. Currently has appt April in [place] but she would prefer [facility]. Currently on tresiba 60u daily. Cbg this morning 92, yesterday 102 fasting.  Lab Results  Component Value Date   HGBA1C 11.4 (H) 10/28/2022   PMR - pending PT and f/u 11/2022 after recent thoracic and cervical MR.      Relevant past medical, surgical, family and social history reviewed and updated as indicated. Interim medical history since our last visit reviewed. Allergies and medications reviewed and updated. Outpatient Medications Prior to Visit  Medication Sig Dispense Refill   acetaminophen (TYLENOL) 500 MG tablet Take 1,500 mg by mouth daily.     albuterol (VENTOLIN HFA) 108 (90 Base) MCG/ACT inhaler Inhale 2 puffs into the lungs every 6 (six)  hours as needed. 8 g 2   Ascorbic Acid (VITAMIN C) 100 MG tablet Take 100 mg by mouth daily.     aspirin EC 81 MG tablet Take 1 tablet (81 mg total) by mouth daily. Swallow whole. 30 tablet 12   Baclofen 5 MG TABS Take 1-2 tablets (5-10 mg total) by mouth at bedtime as needed (neck pain). 30 tablet 3   Cholecalciferol (VITAMIN D3) 25 MCG (1000 UT) CAPS Take 2 capsules (2,000 Units total) by mouth daily.     clopidogrel (PLAVIX) 75 MG tablet TAKE ONE TABLET BY MOUTH EVERY EVENING 30 tablet 6   diclofenac Sodium (VOLTAREN) 1 % GEL Apply 2 g topically 3 (three) times daily. 100 g 1   Glucagon, rDNA, (GLUCAGON EMERGENCY) 1 MG KIT INJECT into THE muscle ONCE AS NEEDED FOR emergency 1 kit 12   insulin degludec (TRESIBA FLEXTOUCH) 100 UNIT/ML FlexTouch Pen Inject 60 Units into the skin at bedtime. 54 mL 1   losartan (COZAAR) 25 MG tablet Take 1 tablet (25 mg total) by mouth daily. 90 tablet 3   metoprolol succinate (TOPROL XL) 25 MG 24 hr tablet Take 1 tablet (25 mg total) by mouth daily. 30 tablet 5   Multiple Vitamin (MULTIVITAMIN ADULT) TABS Take 1 tablet by mouth in the morning and at bedtime.     rosuvastatin (CRESTOR) 20 MG tablet Take 1 tablet (20 mg total) by mouth every evening. [place]  tablet 4   SYNTHROID 75 MCG tablet TAKE ONE TABLET BY MOUTH Monday-Saturday BEFORE breakfast 80 tablet 4   topiramate (TOPAMAX) 50 MG tablet Take 1 tablet (50 mg total) by mouth 2 (two) times daily. 60 tablet 12   torsemide (DEMADEX) 20 MG tablet Take 1 tablet (20 mg total) by mouth daily. 30 tablet 5   TRELEGY ELLIPTA 200-62.5-25 MCG/ACT AEPB INHALE 1 PUFF BY MOUTH INTO LUNGS ONCE daily 60 each 1   No facility-administered medications prior to visit.     Per HPI unless specifically indicated in ROS section below Review of Systems  Objective:  BP 134/70   Pulse 99   Temp (!) 97.4 F (36.3 C) (Temporal)   Ht 5' 3.5" (1.613 m)   Wt 150 lb 8 oz (68.3 kg)   SpO2 96%   BMI 26.24 kg/m   Wt Readings from Last  3 Encounters:  11/21/22 150 lb 8 oz (68.3 kg)  11/18/22 154 lb 2 oz (69.9 kg)  11/16/22 151 lb 0.2 oz (68.5 kg)      Physical Exam Vitals and nursing note reviewed.  Constitutional:      Appearance: Normal appearance. She is not ill-appearing.  HENT:     Head: Normocephalic and atraumatic.     Mouth/Throat:     Mouth: Mucous membranes are moist.     Pharynx: Oropharynx is clear. No oropharyngeal exudate or posterior oropharyngeal erythema.  Eyes:     Extraocular Movements: Extraocular movements intact.     Pupils: Pupils are equal, round, and reactive to light.  Cardiovascular:     Rate and Rhythm: Normal rate and regular rhythm.     Pulses: Normal pulses.     Heart sounds: Normal heart sounds. No murmur heard. Pulmonary:     Effort: Pulmonary effort is normal. No respiratory distress.     Breath sounds: Normal breath sounds. No wheezing, rhonchi or rales.  Musculoskeletal:     Right lower leg: No edema.     Left lower leg: No edema.  Skin:    General: Skin is warm and dry.  Neurological:     Mental Status: She is alert.  Psychiatric:        Mood and Affect: Mood normal.        Behavior: Behavior normal.       Results for orders placed or performed during the hospital encounter of [id number]  Basic metabolic panel  Result Value Ref Range   Sodium 138 135 - 145 mmol/L   Potassium 4.5 3.5 - 5.1 mmol/L   Chloride 110 98 - 111 mmol/L   CO2 24 22 - 32 mmol/L   Glucose, Bld 210 (H) 70 - 99 mg/dL   BUN 15 8 - 23 mg/dL   Creatinine, Ser 0.93 0.44 - 1.00 mg/dL   Calcium 9.7 8.9 - 10.3 mg/dL   GFR, Estimated >60 >60 mL/min   Anion gap 4 (L) 5 - 15  CBC  Result Value Ref Range   WBC 8.6 4.0 - 10.5 K/uL   RBC 5.21 (H) 3.87 - 5.11 MIL/uL   Hemoglobin 14.2 12.0 - 15.0 g/dL   HCT 44.2 36.0 - 46.0 %   MCV 84.8 80.0 - 100.0 fL   MCH 27.3 26.0 - 34.0 pg   MCHC 32.1 30.0 - 36.0 g/dL   RDW 13.8 11.5 - 15.5 %   Platelets 224 150 - 400 K/uL   nRBC 0.0 0.0 - 0.2 %  Brain  natriuretic peptide  Result Value  Ref Range   B Natriuretic Peptide 59.0 0.0 - 100.0 pg/mL  Troponin I (High Sensitivity)  Result Value Ref Range   Troponin I (High Sensitivity) 3 <18 ng/L  Troponin I (High Sensitivity)  Result Value Ref Range   Troponin I (High Sensitivity) 4 <18 ng/L   Per latest pulm note: 12/24/2021 echocardiogram: LVEF 60 to 65%, grade 1 DD, normal right ventricular systolic function.  Normal pulmonary artery systolic pressure.  Mild MR.  04/11/2022 CT chest high-resolution: Mild pulmonary fibrosis pattern with apical to basal gradient, no significant air trapping on expiratory phase.  Not consistent with UIP.  Findings are very mild. 04/22/2022 PFTs: FEV1 1.91 L or 101% predicted, FVC 2.98 L or 117% predicted, FEV1/FVC 64%.  No bronchodilator response, lung volumes normal.  Mild diffusion capacity impairment.  Consistent with mild obstructive airways disease.  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 08/01/2022 connective tissue disease panel, acetylcholine receptor assay: Negative. 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  Patient declined CPAP/oxygen.  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 Discussed endo referral - she wants to go to [facility] - will touch base with referral coordinator regarding this.  I asked her to return in 1 month and keep sugar log to review at next appt.       Grade II diastolic dysfunction   Chronic dyspnea - Primary    ER records reviewed. She does feel better from last week however chronic dyspnea persists intermittently.  Saw CHF clinic, now on torsemide in place of furosemide and has f/u planned next week.  Completed reassuring pulm eval - dyspnea not thought lung related.         No orders of the defined  types were placed in this encounter.   No orders of the defined types were placed in this encounter.   Patient Instructions  Good to see you today.  Continue torsemide '20mg'$  daily in place of furosemide.  We will await CHF doctor evaluation on Friday.  Good to see you today. Return in 1 month for follow up visit - keep a 1 week log of your sugars to review at next visit. Check sugars at least twice a day. Bring in your continuous glucose monitor.   Follow up plan: Return in about 1 month (around 12/20/2022), or if symptoms worsen or fail to improve, for follow up visit.  [name], MD

## 2022-11-21 NOTE — Progress Notes (Incomplete)
Patient ID: Cassandra Benson, female    DOB: 04-Dec-1940, 82 y.o.   MRN: [id number]  This visit was conducted in person.  BP 134/70   Pulse 99   Temp (!) 97.4 F (36.3 C) (Temporal)   Ht 5' 3.5" (1.613 m)   Wt 150 lb 8 oz (68.3 kg)   SpO2 96%   BMI 26.24 kg/m    CC: ER f/u visit  Subjective:   HPI: Cassandra Benson is a 82 y.o. female presenting on 11/21/2022 for Hospitalization Follow-up (Seen on 11/16/22 at [facility] ED, dx DOE. )   Recent ER visit 11/16/2022 after she presented to pulmonology appointment with chest pressure, shortness of breath and increased work of breathing. ER workup revealed elevated sugar to 210, no anemia, normal BNP and normal cardiac enzymes x2, and normal CXR, EKG. She felt abdomen very distended at that time as well.   She saw CHF clinic in follow up last Friday - known chronic diastolic CHF, diuretic changed from furosemide '20mg'$  to torsemide '20mg'$  daily. Chronic dyspnea with minimal exertion. Pulm doesn't feel dyspnea is a lung issue. Planning to see advanced CHF provider Dr Daniel Nones on Friday.   LADA - still pending referral to endocrinologist - difficult to get scheduled as multiple offices are not accepting new patients. Currently has appt April in [place] but she would prefer [facility]. Currently on tresiba 60u daily. Cbg this morning 92, yesterday 102 fasting.  Lab Results  Component Value Date   HGBA1C 11.4 (H) 10/28/2022   PMR - pending PT and f/u 11/2022 after recent thoracic and cervical MR.      Relevant past medical, surgical, family and social history reviewed and updated as indicated. Interim medical history since our last visit reviewed. Allergies and medications reviewed and updated. Outpatient Medications Prior to Visit  Medication Sig Dispense Refill  . acetaminophen (TYLENOL) 500 MG tablet Take 1,500 mg by mouth daily.    Marland Kitchen albuterol (VENTOLIN HFA) 108 (90 Base) MCG/ACT inhaler Inhale 2 puffs into the lungs every 6 (six)  hours as needed. 8 g 2  . Ascorbic Acid (VITAMIN C) 100 MG tablet Take 100 mg by mouth daily.    Marland Kitchen aspirin EC 81 MG tablet Take 1 tablet (81 mg total) by mouth daily. Swallow whole. 30 tablet 12  . Baclofen 5 MG TABS Take 1-2 tablets (5-10 mg total) by mouth at bedtime as needed (neck pain). 30 tablet 3  . Cholecalciferol (VITAMIN D3) 25 MCG (1000 UT) CAPS Take 2 capsules (2,000 Units total) by mouth daily.    . clopidogrel (PLAVIX) 75 MG tablet TAKE ONE TABLET BY MOUTH EVERY EVENING 30 tablet 6  . diclofenac Sodium (VOLTAREN) 1 % GEL Apply 2 g topically 3 (three) times daily. 100 g 1  . Glucagon, rDNA, (GLUCAGON EMERGENCY) 1 MG KIT INJECT into THE muscle ONCE AS NEEDED FOR emergency 1 kit 12  . insulin degludec (TRESIBA FLEXTOUCH) 100 UNIT/ML FlexTouch Pen Inject 60 Units into the skin at bedtime. 54 mL 1  . losartan (COZAAR) 25 MG tablet Take 1 tablet (25 mg total) by mouth daily. 90 tablet 3  . metoprolol succinate (TOPROL XL) 25 MG 24 hr tablet Take 1 tablet (25 mg total) by mouth daily. 30 tablet 5  . Multiple Vitamin (MULTIVITAMIN ADULT) TABS Take 1 tablet by mouth in the morning and at bedtime.    . rosuvastatin (CRESTOR) 20 MG tablet Take 1 tablet (20 mg total) by mouth every evening. [place]  tablet 4  . SYNTHROID 75 MCG tablet TAKE ONE TABLET BY MOUTH Monday-Saturday BEFORE breakfast 80 tablet 4  . topiramate (TOPAMAX) 50 MG tablet Take 1 tablet (50 mg total) by mouth 2 (two) times daily. 60 tablet 12  . torsemide (DEMADEX) 20 MG tablet Take 1 tablet (20 mg total) by mouth daily. 30 tablet 5  . TRELEGY ELLIPTA 200-62.5-25 MCG/ACT AEPB INHALE 1 PUFF BY MOUTH INTO LUNGS ONCE daily 60 each 1   No facility-administered medications prior to visit.     Per HPI unless specifically indicated in ROS section below Review of Systems  Objective:  BP 134/70   Pulse 99   Temp (!) 97.4 F (36.3 C) (Temporal)   Ht 5' 3.5" (1.613 m)   Wt 150 lb 8 oz (68.3 kg)   SpO2 96%   BMI 26.24 kg/m   Wt  Readings from Last 3 Encounters:  11/21/22 150 lb 8 oz (68.3 kg)  11/18/22 154 lb 2 oz (69.9 kg)  11/16/22 151 lb 0.2 oz (68.5 kg)      Physical Exam Vitals and nursing note reviewed.  Constitutional:      Appearance: Normal appearance. She is not ill-appearing.  HENT:     Head: Normocephalic and atraumatic.     Mouth/Throat:     Mouth: Mucous membranes are moist.     Pharynx: Oropharynx is clear. No oropharyngeal exudate or posterior oropharyngeal erythema.  Eyes:     Extraocular Movements: Extraocular movements intact.     Pupils: Pupils are equal, round, and reactive to light.  Cardiovascular:     Rate and Rhythm: Normal rate and regular rhythm.     Pulses: Normal pulses.     Heart sounds: Normal heart sounds. No murmur heard. Pulmonary:     Effort: Pulmonary effort is normal. No respiratory distress.     Breath sounds: Normal breath sounds. No wheezing, rhonchi or rales.  Musculoskeletal:     Right lower leg: No edema.     Left lower leg: No edema.  Skin:    General: Skin is warm and dry.  Neurological:     Mental Status: She is alert.  Psychiatric:        Mood and Affect: Mood normal.        Behavior: Behavior normal.       Results for orders placed or performed during the hospital encounter of 99991111  Basic metabolic panel  Result Value Ref Range   Sodium 138 135 - 145 mmol/L   Potassium 4.5 3.5 - 5.1 mmol/L   Chloride 110 98 - 111 mmol/L   CO2 24 22 - 32 mmol/L   Glucose, Bld 210 (H) 70 - 99 mg/dL   BUN 15 8 - 23 mg/dL   Creatinine, Ser 0.93 0.44 - 1.00 mg/dL   Calcium 9.7 8.9 - 10.3 mg/dL   GFR, Estimated >60 >60 mL/min   Anion gap 4 (L) 5 - 15  CBC  Result Value Ref Range   WBC 8.6 4.0 - 10.5 K/uL   RBC 5.21 (H) 3.87 - 5.11 MIL/uL   Hemoglobin 14.2 12.0 - 15.0 g/dL   HCT 44.2 36.0 - 46.0 %   MCV 84.8 80.0 - 100.0 fL   MCH 27.3 26.0 - 34.0 pg   MCHC 32.1 30.0 - 36.0 g/dL   RDW 13.8 11.5 - 15.5 %   Platelets 224 150 - 400 K/uL   nRBC 0.0 0.0 -  0.2 %  Brain natriuretic peptide  Result Value  Ref Range   B Natriuretic Peptide 59.0 0.0 - 100.0 pg/mL  Troponin I (High Sensitivity)  Result Value Ref Range   Troponin I (High Sensitivity) 3 <18 ng/L  Troponin I (High Sensitivity)  Result Value Ref Range   Troponin I (High Sensitivity) 4 <18 ng/L   Per latest pulm note: 12/24/2021 echocardiogram: LVEF 60 to 65%, grade 1 DD, normal right ventricular systolic function.  Normal pulmonary artery systolic pressure.  Mild MR.  04/11/2022 CT chest high-resolution: Mild pulmonary fibrosis pattern with apical to basal gradient, no significant air trapping on expiratory phase.  Not consistent with UIP.  Findings are very mild. 04/22/2022 PFTs: FEV1 1.91 L or 101% predicted, FVC 2.98 L or 117% predicted, FEV1/FVC 64%.  No bronchodilator response, lung volumes normal.  Mild diffusion capacity impairment.  Consistent with mild obstructive airways disease.  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 08/01/2022 connective tissue disease panel, acetylcholine receptor assay: Negative. 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  Patient declined CPAP/oxygen.  Assessment & Plan:   Problem List Items Addressed This Visit   None    No orders of the defined types were placed in this encounter.   No orders of the defined types were placed in this encounter.   Patient Instructions  Good to see you today.  Continue torsemide '20mg'$  daily in place of furosemide.  We will await CHF doctor evaluation on Friday.  Good to see you today. Return in 1 month for follow up visit - keep a 1 week log of your sugars to review at next visit. Check sugars at least twice a day. Bring in your continuous glucose monitor.   Follow up plan: Return in about 1 month (around 12/20/2022), or if symptoms worsen or fail to improve, for follow up visit.  [name], MD

## 2022-11-21 NOTE — Patient Instructions (Addendum)
Good to see you today.  Continue torsemide '20mg'$  daily in place of furosemide.  We will await CHF doctor evaluation on Friday.  Good to see you today. Return in 1 month for follow up visit - keep a 1 week log of your sugars to review at next visit. Check sugars at least twice a day. Bring in your continuous glucose monitor.

## 2022-11-22 NOTE — Progress Notes (Signed)
Patient ID: Cassandra Benson, female   DOB: Sep 04, 1941, 82 y.o.   MRN: [id number]  [place] - PHARMACIST COUNSELING NOTE  Guideline-Directed Medical Therapy/Evidence Based Medicine  *HFpEF*  ACE/ARB/ARNI: Losartan 25 mg daily Beta Blocker: metoprolol succinate '25mg'$  daily Aldosterone Antagonist:  NONE Diuretic: Furosemide 20 mg daily SGLT2i:  none - latent autoimmune diabetes  Adherence Assessment  Do you ever forget to take your medication? '[]'$ Yes '[x]'$ No  Do you ever skip doses due to side effects? '[]'$ Yes '[x]'$ No  Do you have trouble affording your medicines? '[]'$ Yes '[x]'$ No  Are you ever unable to pick up your medication due to transportation difficulties? '[]'$ Yes '[x]'$ No  Do you ever stop taking your medications because you don't believe they are helping? '[]'$ Yes '[x]'$ No  Do you check your weight daily? '[x]'$ Yes '[]'$ No   Adherence strategy: pill box  Barriers to obtaining medications: none  Vital signs: HR 92, BP 137/65, weight (pounds) 153 lb ECHO: Date 12/24/21, EF 60-65%, notes  mild MR      Latest Ref Rng & Units 11/16/2022   11:10 AM 10/28/2022    8:14 AM 08/16/2022    1:35 PM  BMP  Glucose 70 - 99 mg/dL 210  215  139   BUN 8 - 23 mg/dL '15  11  15   '$ Creatinine 0.44 - 1.00 mg/dL 0.93  0.87  0.95   Sodium 135 - 145 mmol/L 138  142  142   Potassium 3.5 - 5.1 mmol/L 4.5  3.9  4.2   Chloride 98 - 111 mmol/L 110  104  105   CO2 22 - 32 mmol/L '24  28  31   '$ Calcium 8.9 - 10.3 mg/dL 9.7  9.3  9.7     Past Medical History:  Diagnosis Date   Acute diverticulitis 04/2021   [facility], CT confirmed   Allergy    ANA positive    positive ANA pattern 1 speckled   Arthritis    Carotid stenosis, asymptomatic 06/19/2015   123456 RICA 123456 LICA rpt 1 yr (A999333)    CHF (congestive heart failure) ([place])    Colon polyps    COVID-19 virus infection 09/14/2021   Dermatomyositis ([place])    Diabetes mellitus without complication ([place])    Type 1    Diverticulosis    sigmoid on CT scan 12/2019   Family history of adverse reaction to anesthesia    brothr went into cardiac arrest from anectine   Fibromyalgia    prior PCP   GERD (gastroesophageal reflux disease)    prior PCP   Glaucoma    Narrow angle   History of blood clots    DVT, in 20s, none since   History of chicken pox    History of diverticulitis    History of pericarditis 1986   with hospitalization   History of pneumonia 2014   History of shingles    History of UTI    Hyperlipidemia    Hypertension    Hypothyroidism    Mixed connective tissue disease ([place])    Partial small bowel obstruction ([place]) 12/2019   managed conservatively   Peptic ulcer    Pneumonia    PONV (postoperative nausea and vomiting)    Raynaud's disease without gangrene    Shoulder pain left   h/o RTC tendonitis and adhesive capsulitis   Sigmoid diverticulitis 05/26/2021   Sjogren's syndrome ([place])    Sleep apnea    prior PCP - no CPAP for about  10 yrs   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HCC)    Vitamin D deficiency    prior PCP    ASSESSMENT 82 year old female who presents to the HF clinic for follow u[. PMH includes  HTN, HLD, hypothyroidism, CAD, LADA, HA's, fibromyalgia, PAD, Sjogren's syndrome, and HFpEF. Last ED admission was 11/16/2022 for weakness and chest pain. Medication reconciliation was completed during OV.   PLAN Diuresis per NP Follow up as directed by provider   Time spent: 15 minutes  Kailynn Satterly Rodriguez-Guzman PharmD, BCPS 11/22/2022 9:32 AM    Current Outpatient Medications:    acetaminophen (TYLENOL) 500 MG tablet, Take 1,500 mg by mouth daily., Disp: , Rfl:    albuterol (VENTOLIN HFA) 108 (90 Base) MCG/ACT inhaler, Inhale 2 puffs into the lungs every 6 (six) hours as needed., Disp: 8 g, Rfl: 2   Ascorbic Acid (VITAMIN C) 100 MG tablet, Take 100 mg by mouth daily., Disp: , Rfl:    aspirin EC 81 MG tablet, Take 1 tablet (81 mg total) by mouth daily. Swallow whole., Disp: 30  tablet, Rfl: 12   Baclofen 5 MG TABS, Take 1-2 tablets (5-10 mg total) by mouth at bedtime as needed (neck pain)., Disp: 30 tablet, Rfl: 3   Cholecalciferol (VITAMIN D3) 25 MCG (1000 UT) CAPS, Take 2 capsules (2,000 Units total) by mouth daily., Disp: , Rfl:    clopidogrel (PLAVIX) 75 MG tablet, TAKE ONE TABLET BY MOUTH EVERY EVENING, Disp: 30 tablet, Rfl: 6   diclofenac Sodium (VOLTAREN) 1 % GEL, Apply 2 g topically 3 (three) times daily., Disp: 100 g, Rfl: 1   Glucagon, rDNA, (GLUCAGON EMERGENCY) 1 MG KIT, INJECT into THE muscle ONCE AS NEEDED FOR emergency, Disp: 1 kit, Rfl: 12   insulin degludec (TRESIBA FLEXTOUCH) 100 UNIT/ML FlexTouch Pen, Inject 60 Units into the skin at bedtime., Disp: 54 mL, Rfl: 1   losartan (COZAAR) 25 MG tablet, Take 1 tablet (25 mg total) by mouth daily., Disp: 90 tablet, Rfl: 3   metoprolol succinate (TOPROL XL) 25 MG 24 hr tablet, Take 1 tablet (25 mg total) by mouth daily., Disp: 30 tablet, Rfl: 5   Multiple Vitamin (MULTIVITAMIN ADULT) TABS, Take 1 tablet by mouth in the morning and at bedtime., Disp: , Rfl:    rosuvastatin (CRESTOR) 20 MG tablet, Take 1 tablet (20 mg total) by mouth every evening., Disp: 90 tablet, Rfl: 4   SYNTHROID 75 MCG tablet, TAKE ONE TABLET BY MOUTH Monday-Saturday BEFORE breakfast, Disp: 80 tablet, Rfl: 4   topiramate (TOPAMAX) 50 MG tablet, Take 1 tablet (50 mg total) by mouth 2 (two) times daily., Disp: 60 tablet, Rfl: 12   torsemide (DEMADEX) 20 MG tablet, Take 1 tablet (20 mg total) by mouth daily., Disp: 30 tablet, Rfl: 5   TRELEGY ELLIPTA 200-62.5-25 MCG/ACT AEPB, INHALE 1 PUFF BY MOUTH INTO LUNGS ONCE daily, Disp: 60 each, Rfl: 1   MEDICATION ADHERENCES TIPS AND STRATEGIES Taking medication as prescribed improves patient outcomes in heart failure (reduces hospitalizations, improves symptoms, increases survival) Side effects of medications can be managed by decreasing doses, switching agents, stopping drugs, or adding additional  therapy. Please let someone in the [facility] know if you have having bothersome side effects so we can modify your regimen. Do not alter your medication regimen without talking to [place].  Medication reminders can help patients remember to take drugs on time. If you are missing or forgetting doses you can try linking behaviors, using pill boxes, or an electronic reminder  like an alarm on your phone or an app. Some people can also get automated phone calls as medication reminders.

## 2022-11-22 NOTE — Assessment & Plan Note (Signed)
ER records reviewed. She does feel better from last week however chronic dyspnea persists intermittently.  Saw CHF clinic, now on torsemide in place of furosemide and has f/u planned next week.  Completed reassuring pulm eval - dyspnea not thought lung related.

## 2022-11-22 NOTE — Assessment & Plan Note (Addendum)
Continues tresiba. Discussed endo referral - she wants to go to [name] - will touch base with referral coordinator regarding this.  I asked her to return in 1 month and keep sugar log to review at next appt.

## 2022-11-22 NOTE — Telephone Encounter (Signed)
I spoke with pt at office visit yesterday regarding endocrinology - she prefers to go to [facility] rather than [place] as she's more familliar with that area.

## 2022-11-25 ENCOUNTER — Ambulatory Visit: Payer: PPO | Attending: Cardiology | Admitting: Cardiology

## 2022-11-25 ENCOUNTER — Encounter: Payer: Self-pay | Admitting: Cardiology

## 2022-11-25 ENCOUNTER — Encounter: Payer: Self-pay | Admitting: Family Medicine

## 2022-11-25 VITALS — BP 137/81 | HR 83 | Wt 152.0 lb

## 2022-11-25 DIAGNOSIS — R0602 Shortness of breath: Secondary | ICD-10-CM | POA: Insufficient documentation

## 2022-11-25 DIAGNOSIS — E1169 Type 2 diabetes mellitus with other specified complication: Secondary | ICD-10-CM | POA: Diagnosis not present

## 2022-11-25 DIAGNOSIS — E039 Hypothyroidism, unspecified: Secondary | ICD-10-CM | POA: Insufficient documentation

## 2022-11-25 DIAGNOSIS — I739 Peripheral vascular disease, unspecified: Secondary | ICD-10-CM | POA: Diagnosis not present

## 2022-11-25 DIAGNOSIS — J988 Other specified respiratory disorders: Secondary | ICD-10-CM | POA: Insufficient documentation

## 2022-11-25 DIAGNOSIS — R079 Chest pain, unspecified: Secondary | ICD-10-CM

## 2022-11-25 DIAGNOSIS — I110 Hypertensive heart disease with heart failure: Secondary | ICD-10-CM | POA: Diagnosis not present

## 2022-11-25 DIAGNOSIS — M3500 Sicca syndrome, unspecified: Secondary | ICD-10-CM | POA: Insufficient documentation

## 2022-11-25 DIAGNOSIS — I5032 Chronic diastolic (congestive) heart failure: Secondary | ICD-10-CM | POA: Diagnosis not present

## 2022-11-25 DIAGNOSIS — E785 Hyperlipidemia, unspecified: Secondary | ICD-10-CM | POA: Insufficient documentation

## 2022-11-25 DIAGNOSIS — I5030 Unspecified diastolic (congestive) heart failure: Secondary | ICD-10-CM | POA: Insufficient documentation

## 2022-11-25 DIAGNOSIS — Z8249 Family history of ischemic heart disease and other diseases of the circulatory system: Secondary | ICD-10-CM | POA: Insufficient documentation

## 2022-11-25 DIAGNOSIS — M797 Fibromyalgia: Secondary | ICD-10-CM | POA: Diagnosis not present

## 2022-11-25 DIAGNOSIS — Z79899 Other long term (current) drug therapy: Secondary | ICD-10-CM | POA: Diagnosis not present

## 2022-11-25 DIAGNOSIS — I2510 Atherosclerotic heart disease of native coronary artery without angina pectoris: Secondary | ICD-10-CM | POA: Diagnosis not present

## 2022-11-25 DIAGNOSIS — Z794 Long term (current) use of insulin: Secondary | ICD-10-CM | POA: Diagnosis not present

## 2022-11-25 DIAGNOSIS — Z823 Family history of stroke: Secondary | ICD-10-CM | POA: Insufficient documentation

## 2022-11-25 LAB — CBC
HCT: 44.4 % (ref 36.0–46.0)
Hemoglobin: 14.3 g/dL (ref 12.0–15.0)
MCH: 27.1 pg (ref 26.0–34.0)
MCHC: 32.2 g/dL (ref 30.0–36.0)
MCV: 84.3 fL (ref 80.0–100.0)
Platelets: 245 10*3/uL (ref 150–400)
RBC: 5.27 MIL/uL — ABNORMAL HIGH (ref 3.87–5.11)
RDW: 13.2 % (ref 11.5–15.5)
WBC: 10.7 10*3/uL — ABNORMAL HIGH (ref 4.0–10.5)
nRBC: 0 % (ref 0.0–0.2)

## 2022-11-25 LAB — BASIC METABOLIC PANEL
Anion gap: 9 (ref 5–15)
BUN: 20 mg/dL (ref 8–23)
CO2: 33 mmol/L — ABNORMAL HIGH (ref 22–32)
Calcium: 9.5 mg/dL (ref 8.9–10.3)
Chloride: 97 mmol/L — ABNORMAL LOW (ref 98–111)
Creatinine, Ser: 0.91 mg/dL (ref 0.44–1.00)
GFR, Estimated: >60 mL/min (ref >60)
Glucose, Bld: 112 mg/dL — ABNORMAL HIGH (ref 70–99)
Potassium: 3.2 mmol/L — ABNORMAL LOW (ref 3.5–5.1)
Sodium: 139 mmol/L (ref 135–145)

## 2022-11-25 LAB — BRAIN NATRIURETIC PEPTIDE: B Natriuretic Peptide: 26.2 pg/mL (ref 0.0–100.0)

## 2022-11-25 MED ORDER — SPIRONOLACTONE 25 MG PO TABS: 12.5000 mg | ORAL_TABLET | Freq: Every day | ORAL | 3 refills | Status: DC

## 2022-11-25 NOTE — Progress Notes (Signed)
REDS VEST READING= 22   CHEST RULER=32  VEST FITTING TASKS: POSTURE=sitting HEIGHT MARKER=A CENTER STRIP=aligned  COMMENTS:

## 2022-11-25 NOTE — Patient Instructions (Addendum)
Medication Changes:  Start taking Spironolactone 12.'5mg'$  (0.5 tablet) daily  Lab Work:  Labs done today, your results will be available in MyChart, we will contact you for abnormal readings.   Testing/Procedures:  Your physician has requested that you have an echocardiogram. Echocardiography is a painless test that uses sound waves to create images of your heart. It provides your doctor with information about the size and shape of your heart and how well your heart's chambers and valves are working. This procedure takes approximately one hour. There are no restrictions for this procedure. Please do NOT wear cologne, perfume, aftershave, or lotions (deodorant is allowed). Please arrive 15 minutes prior to your appointment time.   Your physician has requested that you have a cardiac catheterization. Cardiac catheterization is used to diagnose and/or treat various heart conditions. Doctors may recommend this procedure for a number of different reasons. The most common reason is to evaluate chest pain. Chest pain can be a symptom of coronary artery disease (CAD), and cardiac catheterization can show whether plaque is narrowing or blocking your heart's arteries. This procedure is also used to evaluate the valves, as well as measure the blood flow and oxygen levels in different parts of your heart. For further information please visit [email or web address]. Please follow instruction sheet, as given.    [facility] [place] [address] Dept: [phone]    You are scheduled for a Cardiac Catheterization on Friday, March 8 with Dr. Harrell Gave End.  1. Please arrive at the [name], [place], [place] at 09:30am am/pm (This is 1 hour prior(08:30am) to your procedure time).  Proceed to the Check-In Desk directly inside the  entrance.  Procedure Parking: Use the entrance off of the [address] [place] side of the hospital. Turn right upon entering and follow the driveway to parking that is directly in front of the [place]. There is no valet parking available at this entrance, however there is an awning directly in front of the [place] for drop off/ pick up for patients.  Special note: Every effort is made to have your procedure done on time. Please understand that emergencies sometimes delay scheduled procedures.  2. Diet: Do not eat solid foods after midnight.  The patient may have clear liquids until 5am upon the day of the procedure.   4. Medication instructions in preparation for your procedure: Hold your spironolactone and torsemide the day of your procedure.  Hold your insulin the morning of your surgery.    5. Plan for one night stay--bring personal belongings. 6. Bring a current list of your medications and current insurance cards. 7. You MUST have a responsible person to drive you home. 8. Someone MUST be with you the first 24 hours after you arrive home or your discharge will be delayed. 9. Please wear clothes that are easy to get on and off and wear slip-on shoes.  Thank you for allowing [place] to care for you!   -- [facility]     Special Instructions // Education:  Do the following things EVERYDAY: Weigh yourself in the morning before breakfast. Write it down and keep it in a log. Take your medicines as prescribed Eat low salt foods--Limit salt (sodium) to 2000 mg per day.  Stay as active as you can everyday Limit all fluids for the day to less  than 2 liters   Follow-Up in: You will have a follow up appointment in 3 months. We will call you to schedule this appointment.     If you have any questions or concerns before your next appointment please send [place] a message through [place] or call our office at [phone] Monday-Friday 8  am-5 pm.   If you have an urgent need after hours on the weekend please call your Primary Cardiologist or the [facility] in [place] at [phone].

## 2022-11-25 NOTE — Progress Notes (Signed)
ADVANCED HEART FAILURE CLINIC NOTE  Referring Physician: [name], MD  Primary Care: [name], MD Primary Cardiologist:  HPI: Cassandra Benson is a 82 y.o. female with hypertension, hyperlipidemia, hypothyroidism, coronary artery disease, fibromyalgia, PAD, Sjogren's syndrome and heart failure with preserved ejection fraction presenting today to establish care.  Cassandra Benson regularly follows with Darylene Price.  She was most recently admitted on November 19, 2022 due to shortness of breath secondary to heart failure exacerbation.  Since that time she has also had PFTs that demonstrate mild obstructive airway disease with a mild diffusion defect.  Interval hx:  Cassandra Benson continues to have episodes of shortness of breath 2-3x week. These episodes are more frequent/worse with exertion and have become significantly worse over the past 1 year. She reports becoming significantly fatigued with mild to moderate activity to the point that she cannot speak in complete sentences. These symptoms improve with prolonged rest, however, can last up to an hour. Not associated diaphoresis, she reports feeling a fullness in her abdomen with pressure in the right upper extremity; she reports this as a heaviness. She has significant family history of heart attacks / strokes (sister / brothers with multiple PCI).    Past Medical History:  Diagnosis Date   Acute diverticulitis 04/2021   [facility] ER, CT confirmed   Allergy    ANA positive    positive ANA pattern 1 speckled   Arthritis    Carotid stenosis, asymptomatic 06/19/2015   123456 RICA 123456 LICA rpt 1 yr ([id number])    CHF (congestive heart failure) ([place])    Colon polyps    COVID-19 virus infection 09/14/2021   Dermatomyositis ([place])    Diabetes mellitus without complication ([place])    Type 1   Diverticulosis    sigmoid on CT scan 12/2019   Family history of adverse reaction to anesthesia    brothr went into cardiac arrest from  anectine   Fibromyalgia    prior PCP   GERD (gastroesophageal reflux disease)    prior PCP   Glaucoma    Narrow angle   History of blood clots    DVT, in 20s, none since   History of chicken pox    History of diverticulitis    History of pericarditis 1986   with hospitalization   History of pneumonia 2014   History of shingles    History of UTI    Hyperlipidemia    Hypertension    Hypothyroidism    Mixed connective tissue disease ([place])    Partial small bowel obstruction ([place]) 12/2019   managed conservatively   Peptic ulcer    Pneumonia    PONV (postoperative nausea and vomiting)    Raynaud's disease without gangrene    Shoulder pain left   h/o RTC tendonitis and adhesive capsulitis   Sigmoid diverticulitis 05/26/2021   Sjogren's syndrome ([place])    Sleep apnea    prior PCP - no CPAP for about 10 yrs   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HCC)    Vitamin D deficiency    prior PCP    Current Outpatient Medications  Medication Sig Dispense Refill   acetaminophen (TYLENOL) 500 MG tablet Take 1,500 mg by mouth daily.     albuterol (VENTOLIN HFA) 108 (90 Base) MCG/ACT inhaler Inhale 2 puffs into the lungs every 6 (six) hours as needed. 8 g 2   Ascorbic Acid (VITAMIN C) 100 MG tablet Take 100 mg by mouth daily.     aspirin EC 81 MG tablet  Take 1 tablet (81 mg total) by mouth daily. Swallow whole. 30 tablet 12   Baclofen 5 MG TABS Take 1-2 tablets (5-10 mg total) by mouth at bedtime as needed (neck pain). 30 tablet 3   Cholecalciferol (VITAMIN D3) 25 MCG (1000 UT) CAPS Take 2 capsules (2,000 Units total) by mouth daily.     clopidogrel (PLAVIX) 75 MG tablet TAKE ONE TABLET BY MOUTH EVERY EVENING 30 tablet 6   diclofenac Sodium (VOLTAREN) 1 % GEL Apply 2 g topically 3 (three) times daily. 100 g 1   insulin degludec (TRESIBA FLEXTOUCH) 100 UNIT/ML FlexTouch Pen Inject 60 Units into the skin at bedtime. 54 mL 1   losartan (COZAAR) 25 MG tablet Take 1 tablet (25 mg total) by mouth daily. 90 tablet 3    metoprolol succinate (TOPROL XL) 25 MG 24 hr tablet Take 1 tablet (25 mg total) by mouth daily. 30 tablet 5   Multiple Vitamin (MULTIVITAMIN ADULT) TABS Take 1 tablet by mouth in the morning and at bedtime.     rosuvastatin (CRESTOR) 20 MG tablet Take 1 tablet (20 mg total) by mouth every evening. 90 tablet 4   SYNTHROID 75 MCG tablet TAKE ONE TABLET BY MOUTH Monday-Saturday BEFORE breakfast 80 tablet 4   topiramate (TOPAMAX) 50 MG tablet Take 1 tablet (50 mg total) by mouth 2 (two) times daily. 60 tablet 12   torsemide (DEMADEX) 20 MG tablet Take 1 tablet (20 mg total) by mouth daily. 30 tablet 5   TRELEGY ELLIPTA 200-62.5-25 MCG/ACT AEPB INHALE 1 PUFF BY MOUTH INTO LUNGS ONCE daily 60 each 1   Glucagon, rDNA, (GLUCAGON EMERGENCY) 1 MG KIT INJECT into THE muscle ONCE AS NEEDED FOR emergency 1 kit 12   No current facility-administered medications for this visit.    Allergies  Allergen Reactions   Iodinated Contrast Media Other (See Comments)    Itching (severe) and chest tightness   Penicillins Anaphylaxis, Swelling, Rash and Other (See Comments)    Has patient had a PCN reaction causing immediate rash, facial/tongue/throat swelling, SOB or lightheadedness with hypotension: Yes Has patient had a PCN reaction causing severe rash involving mucus membranes or skin necrosis: yes - remotely Has patient had a PCN reaction that required hospitalization: occurred while hospitalized Has patient had a PCN reaction occurring within the last 10 years: No If all of the above answers are "NO", then may proceed with Cephalosporin use.    Amlodipine Swelling    Pedal edema   Anectine [Succinylcholine] Other (See Comments)    Brother went into cardiac arrest.   Carvedilol Dermatitis    Facial acne   Codeine Nausea Only   Gabapentin Other (See Comments)    Gait abnormality   Influenza Vaccines Other (See Comments)    Muscle weakness; unable to walk   Insulin Aspart Other (See Comments)     headache   Nortriptyline Other (See Comments)    Eye swelling and mouth drawed up   Prednisone     Marked severe hyperglycemia to oral and IM steroids   Valsartan Other (See Comments) and Cough    Allergy to generic only, "Hacking" cough   Zetia [Ezetimibe] Other (See Comments)    Bad muscle cramps   Erythromycin Rash and Swelling   Sulfa Antibiotics Rash      Social History   Socioeconomic History   Marital status: Widowed    Spouse name: Not on file   Number of children: 2   Years of education: Not on  file   Highest education level: Not on file  Occupational History   Occupation: retired  Tobacco Use   Smoking status: Never   Smokeless tobacco: Never  Vaping Use   Vaping Use: Never used  Substance and Sexual Activity   Alcohol use: No   Drug use: No   Sexual activity: Not on file  Other Topics Concern   Not on file  Social History Narrative   Lives in [place], moved from [place].    Widow - husband decreased 01/2016 of metastatic colon CA   No pets.   Son Maeghan Grape lives nearby. Daughter lives in [place]. Sister lives 2 blocks away.    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, [employer]   Exercise - limited   Diet - good water, fruits/vegetables daily, limited meat, protein drink every morning   Social Determinants of Health   Financial Resource Strain: Low Risk  (09/27/2022)   Overall Financial Resource Strain (CARDIA)    Difficulty of Paying Living Expenses: Not hard at all  Food Insecurity: No Food Insecurity (09/27/2022)   Hunger Vital Sign    Worried About Running Out of Food in the Last Year: Never true    Ran Out of Food in the Last Year: Never true  Transportation Needs: No Transportation Needs (09/27/2022)   PRAPARE - Hydrologist (Medical): No    Lack of Transportation (Non-Medical): No  Physical Activity: Insufficiently Active (09/27/2022)    Exercise Vital Sign    Days of Exercise per Week: 3 days    Minutes of Exercise per Session: 30 min  Stress: No Stress Concern Present (09/27/2022)   [place]    Feeling of Stress : Not at all  Social Connections: Moderately Integrated (09/27/2022)   Social Connection and Isolation Panel [NHANES]    Frequency of Communication with Friends and Family: More than three times a week    Frequency of Social Gatherings with Friends and Family: More than three times a week    Attends Religious Services: More than 4 times per year    Active Member of [employer] or Organizations: Yes    Attends Archivist Meetings: More than 4 times per year    Marital Status: Widowed  Intimate Partner Violence: Not At Risk (09/27/2022)   Humiliation, Afraid, Rape, and Kick questionnaire    Fear of Current or Ex-Partner: No    Emotionally Abused: No    Physically Abused: No    Sexually Abused: No      Family History  Problem Relation Age of Onset   CAD Mother 51       MI, aortic valve issues   COPD Mother    Lupus Mother    [name] Mother    Rheum arthritis Mother    CAD Father 73       CABG x2, aortic valve replacement   Stroke Sister    CAD Sister    Anuerysm Sister        brain   Lupus Sister    Diabetes Sister    Diabetes Sister    Breast cancer Sister    Alcohol abuse Brother    CAD Brother 46       MI   Stroke Brother    COPD Brother        agent orange  CAD Brother 30       stent   Diabetes Brother    Stroke Maternal Grandmother    Hypertension Maternal Grandmother    Gallbladder disease Maternal Grandmother    Breast cancer Maternal Aunt    Breast cancer Maternal Aunt    Depression Grandchild    Colon cancer Neg Hx    Esophageal cancer Neg Hx    Rectal cancer Neg Hx    Stomach cancer Neg Hx     PHYSICAL EXAM: Vitals:   11/25/22 1021  BP: 137/81  Pulse: 83  SpO2: 100%   GENERAL: Well  nourished, well developed, and in no apparent distress at rest.  HEENT: Negative for arcus senilis or xanthelasma. There is no scleral icterus.  The mucous membranes are pink and moist.   NECK: Supple, No masses. Normal carotid upstrokes without bruits. No masses or thyromegaly.    CHEST: There are no chest wall deformities. There is no chest wall tenderness. Respirations are unlabored.  Lungs- CTA b/l CARDIAC:  JVP: 7 cm H2O         Normal S1, S2  Normal rate with regular rhythm. No murmurs, rubs or gallops.  Pulses are 2+ and symmetrical in upper and lower extremities. No edema.  ABDOMEN: Soft, non-tender, non-distended. There are no masses or hepatomegaly. There are normal bowel sounds.  EXTREMITIES: Warm and well perfused with no cyanosis, clubbing.  LYMPHATIC: No axillary or supraclavicular lymphadenopathy.  NEUROLOGIC: Patient is oriented x3 with no focal or lateralizing neurologic deficits.  PSYCH: Patients affect is appropriate, there is no evidence of anxiety or depression.  SKIN: Warm and dry; no lesions or wounds.   DATA REVIEW  ECG: 11/17/2022: Normal sinus rhythm as per my personal interpretation  ECHO: 12/24/21: LVEF 60 to 65% with grade 1 diastolic dysfunction.  Normal RV function.  RVSP of 22 mmHg as per my read.  CATH: No prior heart cath  PFTs on 04/22/2022: Mild obstructive disease as per my read.  ASSESSMENT & PLAN:  Heart failure with preserved EF - Grade 1 DD by most recent echo.  - Unable to start SGLT2i due to LADA - BMP/BNP today - REDs  - Start spironolactone 12.'5mg'$  - RHC, eval for PH and HFpEF although low suspicion  - BMP/BNP today.   2. Shortness of breath / chest pain  - Coronary CTA last year that was unremarkable; since that time reports progression of symptoms of SOB and atypical chest pain (RUE numbness, epigastric fullness etc) that becomes worse with exertion and improves with rest. I am not highly suspicious of CAD, however, given continued  symptoms and patient concern will move forward with LHC/RHC.  - Extensive evaluation by pulmonology that has been mostly negative.  - Followed by Lamount Cranker   >45 minutes spent reviewing imaging, echo, ECG and discussing findings with patient today.   Nazim Kadlec Advanced Heart Failure Mechanical Circulatory Support

## 2022-11-25 NOTE — H&P (View-Only) (Signed)
 ADVANCED HEART FAILURE CLINIC NOTE  Referring Physician: Gutierrez, Javier, MD  Primary Care: Gutierrez, Javier, MD Primary Cardiologist:  HPI: Cassandra Benson is a 81 y.o. female with hypertension, hyperlipidemia, hypothyroidism, coronary artery disease, fibromyalgia, PAD, Sjogren's syndrome and heart failure with preserved ejection fraction presenting today to establish care.  Ms. Vrba regularly follows with Tina Hackney.  She was most recently admitted on November 19, 2022 due to shortness of breath secondary to heart failure exacerbation.  Since that time she has also had PFTs that demonstrate mild obstructive airway disease with a mild diffusion defect.  Interval hx:  Ms. Duplessis continues to have episodes of shortness of breath 2-3x week. These episodes are more frequent/worse with exertion and have become significantly worse over the past 1 year. She reports becoming significantly fatigued with mild to moderate activity to the point that she cannot speak in complete sentences. These symptoms improve with prolonged rest, however, can last up to an hour. Not associated diaphoresis, she reports feeling a fullness in her abdomen with pressure in the right upper extremity; she reports this as a heaviness. She has significant family history of heart attacks / strokes (sister / brothers with multiple PCI).    Past Medical History:  Diagnosis Date   Acute diverticulitis 04/2021   [facility] ER, CT confirmed   Allergy    ANA positive    positive ANA pattern 1 speckled   Arthritis    Carotid stenosis, asymptomatic 06/19/2015   1-39% RICA 40-59% LICA rpt 1 yr (05/2015)    CHF (congestive heart failure) (HCC)    Colon polyps    COVID-19 virus infection 09/14/2021   Dermatomyositis (HCC)    Diabetes mellitus without complication (HCC)    Type 1   Diverticulosis    sigmoid on CT scan 12/2019   Family history of adverse reaction to anesthesia    brothr went into cardiac arrest from  anectine   Fibromyalgia    prior PCP   GERD (gastroesophageal reflux disease)    prior PCP   Glaucoma    Narrow angle   History of blood clots    DVT, in 20s, none since   History of chicken pox    History of diverticulitis    History of pericarditis 1986   with hospitalization   History of pneumonia 2014   History of shingles    History of UTI    Hyperlipidemia    Hypertension    Hypothyroidism    Mixed connective tissue disease (HCC)    Partial small bowel obstruction (HCC) 12/2019   managed conservatively   Peptic ulcer    Pneumonia    PONV (postoperative nausea and vomiting)    Raynaud's disease without gangrene    Shoulder pain left   h/o RTC tendonitis and adhesive capsulitis   Sigmoid diverticulitis 05/26/2021   Sjogren's syndrome (HCC)    Sleep apnea    prior PCP - no CPAP for about 10 yrs   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HCC)    Vitamin D deficiency    prior PCP    Current Outpatient Medications  Medication Sig Dispense Refill   acetaminophen (TYLENOL) 500 MG tablet Take 1,500 mg by mouth daily.     albuterol (VENTOLIN HFA) 108 (90 Base) MCG/ACT inhaler Inhale 2 puffs into the lungs every 6 (six) hours as needed. 8 g 2   Ascorbic Acid (VITAMIN C) 100 MG tablet Take 100 mg by mouth daily.     aspirin EC 81 MG tablet   Take 1 tablet (81 mg total) by mouth daily. Swallow whole. 30 tablet 12   Baclofen 5 MG TABS Take 1-2 tablets (5-10 mg total) by mouth at bedtime as needed (neck pain). 30 tablet 3   Cholecalciferol (VITAMIN D3) 25 MCG (1000 UT) CAPS Take 2 capsules (2,000 Units total) by mouth daily.     clopidogrel (PLAVIX) 75 MG tablet TAKE ONE TABLET BY MOUTH EVERY EVENING 30 tablet 6   diclofenac Sodium (VOLTAREN) 1 % GEL Apply 2 g topically 3 (three) times daily. 100 g 1   insulin degludec (TRESIBA FLEXTOUCH) 100 UNIT/ML FlexTouch Pen Inject 60 Units into the skin at bedtime. 54 mL 1   losartan (COZAAR) 25 MG tablet Take 1 tablet (25 mg total) by mouth daily. 90 tablet 3    metoprolol succinate (TOPROL XL) 25 MG 24 hr tablet Take 1 tablet (25 mg total) by mouth daily. 30 tablet 5   Multiple Vitamin (MULTIVITAMIN ADULT) TABS Take 1 tablet by mouth in the morning and at bedtime.     rosuvastatin (CRESTOR) 20 MG tablet Take 1 tablet (20 mg total) by mouth every evening. 90 tablet 4   SYNTHROID 75 MCG tablet TAKE ONE TABLET BY MOUTH Monday-Saturday BEFORE breakfast 80 tablet 4   topiramate (TOPAMAX) 50 MG tablet Take 1 tablet (50 mg total) by mouth 2 (two) times daily. 60 tablet 12   torsemide (DEMADEX) 20 MG tablet Take 1 tablet (20 mg total) by mouth daily. 30 tablet 5   TRELEGY ELLIPTA 200-62.5-25 MCG/ACT AEPB INHALE 1 PUFF BY MOUTH INTO LUNGS ONCE daily 60 each 1   Glucagon, rDNA, (GLUCAGON EMERGENCY) 1 MG KIT INJECT into THE muscle ONCE AS NEEDED FOR emergency 1 kit 12   No current facility-administered medications for this visit.    Allergies  Allergen Reactions   Iodinated Contrast Media Other (See Comments)    Itching (severe) and chest tightness   Penicillins Anaphylaxis, Swelling, Rash and Other (See Comments)    Has patient had a PCN reaction causing immediate rash, facial/tongue/throat swelling, SOB or lightheadedness with hypotension: Yes Has patient had a PCN reaction causing severe rash involving mucus membranes or skin necrosis: yes - remotely Has patient had a PCN reaction that required hospitalization: occurred while hospitalized Has patient had a PCN reaction occurring within the last 10 years: No If all of the above answers are "NO", then may proceed with Cephalosporin use.    Amlodipine Swelling    Pedal edema   Anectine [Succinylcholine] Other (See Comments)    Brother went into cardiac arrest.   Carvedilol Dermatitis    Facial acne   Codeine Nausea Only   Gabapentin Other (See Comments)    Gait abnormality   Influenza Vaccines Other (See Comments)    Muscle weakness; unable to walk   Insulin Aspart Other (See Comments)     headache   Nortriptyline Other (See Comments)    Eye swelling and mouth drawed up   Prednisone     Marked severe hyperglycemia to oral and IM steroids   Valsartan Other (See Comments) and Cough    Allergy to generic only, "Hacking" cough   Zetia [Ezetimibe] Other (See Comments)    Bad muscle cramps   Erythromycin Rash and Swelling   Sulfa Antibiotics Rash      Social History   Socioeconomic History   Marital status: Widowed    Spouse name: Not on file   Number of children: 2   Years of education: Not on   file   Highest education level: Not on file  Occupational History   Occupation: retired  Tobacco Use   Smoking status: Never   Smokeless tobacco: Never  Vaping Use   Vaping Use: Never used  Substance and Sexual Activity   Alcohol use: No   Drug use: No   Sexual activity: Not on file  Other Topics Concern   Not on file  Social History Narrative   Lives in [name], moved from [place].    Widow - husband decreased 01/2016 of metastatic colon CA   No pets.   Son Matthew Stenseth lives nearby. Daughter lives in [place]. Sister lives 2 blocks away.    Grandson committed suicide in [place] 2014    Work - retired, prior exec secretary   Hobbies - works with her church, [employer]   Exercise - limited   Diet - good water, fruits/vegetables daily, limited meat, protein drink every morning   Social Determinants of Health   Financial Resource Strain: Low Risk  (09/27/2022)   Overall Financial Resource Strain (CARDIA)    Difficulty of Paying Living Expenses: Not hard at all  Food Insecurity: No Food Insecurity (09/27/2022)   Hunger Vital Sign    Worried About Running Out of Food in the Last Year: Never true    Ran Out of Food in the Last Year: Never true  Transportation Needs: No Transportation Needs (09/27/2022)   PRAPARE - Transportation    Lack of Transportation (Medical): No    Lack of Transportation (Non-Medical): No  Physical Activity: Insufficiently Active (09/27/2022)    Exercise Vital Sign    Days of Exercise per Week: 3 days    Minutes of Exercise per Session: 30 min  Stress: No Stress Concern Present (09/27/2022)   [employer] - Occupational Stress Questionnaire    Feeling of Stress : Not at all  Social Connections: Moderately Integrated (09/27/2022)   Social Connection and Isolation Panel [NHANES]    Frequency of Communication with Friends and Family: More than three times a week    Frequency of Social Gatherings with Friends and Family: More than three times a week    Attends Religious Services: More than 4 times per year    Active Member of Clubs or Organizations: Yes    Attends Club or Organization Meetings: More than 4 times per year    Marital Status: Widowed  Intimate Partner Violence: Not At Risk (09/27/2022)   Humiliation, Afraid, Rape, and Kick questionnaire    Fear of Current or Ex-Partner: No    Emotionally Abused: No    Physically Abused: No    Sexually Abused: No      Family History  Problem Relation Age of Onset   CAD Mother 70       MI, aortic valve issues   COPD Mother    Lupus Mother    Graves' disease Mother    Rheum arthritis Mother    CAD Father 70       CABG x2, aortic valve replacement   Stroke Sister    CAD Sister    Anuerysm Sister        brain   Lupus Sister    Diabetes Sister    Diabetes Sister    Breast cancer Sister    Alcohol abuse Brother    CAD Brother 41       MI   Stroke Brother    COPD Brother        agent orange     CAD Brother 60       stent   Diabetes Brother    Stroke Maternal Grandmother    Hypertension Maternal Grandmother    Gallbladder disease Maternal Grandmother    Breast cancer Maternal Aunt    Breast cancer Maternal Aunt    Depression Grandchild    Colon cancer Neg Hx    Esophageal cancer Neg Hx    Rectal cancer Neg Hx    Stomach cancer Neg Hx     PHYSICAL EXAM: Vitals:   11/25/22 1021  BP: 137/81  Pulse: 83  SpO2: 100%   GENERAL: Well  nourished, well developed, and in no apparent distress at rest.  HEENT: Negative for arcus senilis or xanthelasma. There is no scleral icterus.  The mucous membranes are pink and moist.   NECK: Supple, No masses. Normal carotid upstrokes without bruits. No masses or thyromegaly.    CHEST: There are no chest wall deformities. There is no chest wall tenderness. Respirations are unlabored.  Lungs- CTA b/l CARDIAC:  JVP: 7 cm H2O         Normal S1, S2  Normal rate with regular rhythm. No murmurs, rubs or gallops.  Pulses are 2+ and symmetrical in upper and lower extremities. No edema.  ABDOMEN: Soft, non-tender, non-distended. There are no masses or hepatomegaly. There are normal bowel sounds.  EXTREMITIES: Warm and well perfused with no cyanosis, clubbing.  LYMPHATIC: No axillary or supraclavicular lymphadenopathy.  NEUROLOGIC: Patient is oriented x3 with no focal or lateralizing neurologic deficits.  PSYCH: Patients affect is appropriate, there is no evidence of anxiety or depression.  SKIN: Warm and dry; no lesions or wounds.   DATA REVIEW  ECG: 11/17/2022: Normal sinus rhythm as per my personal interpretation  ECHO: 12/24/21: LVEF 60 to 65% with grade 1 diastolic dysfunction.  Normal RV function.  RVSP of 22 mmHg as per my read.  CATH: No prior heart cath  PFTs on 04/22/2022: Mild obstructive disease as per my read.  ASSESSMENT & PLAN:  Heart failure with preserved EF - Grade 1 DD by most recent echo.  - Unable to start SGLT2i due to LADA - BMP/BNP today - REDs  - Start spironolactone 12.5mg - RHC, eval for PH and HFpEF although low suspicion  - BMP/BNP today.   2. Shortness of breath / chest pain  - Coronary CTA last year that was unremarkable; since that time reports progression of symptoms of SOB and atypical chest pain (RUE numbness, epigastric fullness etc) that becomes worse with exertion and improves with rest. I am not highly suspicious of CAD, however, given continued  symptoms and patient concern will move forward with LHC/RHC.  - Extensive evaluation by pulmonology that has been mostly negative.  - Followed by Laura Gonazalez   >45 minutes spent reviewing imaging, echo, ECG and discussing findings with patient today.   Zylen Wenig Advanced Heart Failure Mechanical Circulatory Support  

## 2022-11-28 ENCOUNTER — Other Ambulatory Visit: Payer: Self-pay | Admitting: Cardiology

## 2022-11-28 ENCOUNTER — Telehealth: Payer: Self-pay

## 2022-11-28 DIAGNOSIS — I5032 Chronic diastolic (congestive) heart failure: Secondary | ICD-10-CM

## 2022-11-28 NOTE — Telephone Encounter (Signed)
Per Dr. Saunders Revel, pt will require premedication prior to cath. Pt is to come at 8 am to receive medication prior to 11:30 cath time. Pt made aware and verbalized understanding Nurse also contacted cath lab to make aware.

## 2022-12-02 ENCOUNTER — Ambulatory Visit
Admission: RE | Admit: 2022-12-02 | Discharge: 2022-12-02 | Disposition: A | Discharge status: Home / Self Care | Payer: PPO | Attending: Internal Medicine | Admitting: Internal Medicine

## 2022-12-02 ENCOUNTER — Encounter: Payer: Self-pay | Admitting: Internal Medicine

## 2022-12-02 ENCOUNTER — Telehealth: Payer: Self-pay | Admitting: Physician Assistant

## 2022-12-02 ENCOUNTER — Encounter
Admission: RE | Disposition: A | Discharge status: Home / Self Care | Payer: Self-pay | Source: Home / Self Care | Attending: Internal Medicine

## 2022-12-02 ENCOUNTER — Other Ambulatory Visit: Payer: Self-pay

## 2022-12-02 DIAGNOSIS — E785 Hyperlipidemia, unspecified: Secondary | ICD-10-CM | POA: Insufficient documentation

## 2022-12-02 DIAGNOSIS — I509 Heart failure, unspecified: Secondary | ICD-10-CM

## 2022-12-02 DIAGNOSIS — M3500 Sicca syndrome, unspecified: Secondary | ICD-10-CM | POA: Insufficient documentation

## 2022-12-02 DIAGNOSIS — I110 Hypertensive heart disease with heart failure: Secondary | ICD-10-CM | POA: Insufficient documentation

## 2022-12-02 DIAGNOSIS — I2510 Atherosclerotic heart disease of native coronary artery without angina pectoris: Secondary | ICD-10-CM | POA: Diagnosis not present

## 2022-12-02 DIAGNOSIS — E039 Hypothyroidism, unspecified: Secondary | ICD-10-CM | POA: Diagnosis not present

## 2022-12-02 DIAGNOSIS — M797 Fibromyalgia: Secondary | ICD-10-CM | POA: Insufficient documentation

## 2022-12-02 DIAGNOSIS — I5032 Chronic diastolic (congestive) heart failure: Secondary | ICD-10-CM | POA: Diagnosis not present

## 2022-12-02 DIAGNOSIS — R079 Chest pain, unspecified: Secondary | ICD-10-CM

## 2022-12-02 HISTORY — PX: RIGHT HEART CATH AND CORONARY ANGIOGRAPHY: CATH118264

## 2022-12-02 LAB — POCT I-STAT 7, (LYTES, BLD GAS, ICA,H+H)
Acid-base deficit: 1 mmol/L (ref 0.0–2.0)
Bicarbonate: 21.6 mmol/L (ref 20.0–28.0)
Calcium, Ion: 1.24 mmol/L (ref 1.15–1.40)
HCT: 36 % (ref 36.0–46.0)
Hemoglobin: 12.2 g/dL (ref 12.0–15.0)
O2 Saturation: 99 %
Potassium: 3.6 mmol/L (ref 3.5–5.1)
Sodium: 142 mmol/L (ref 135–145)
TCO2: 23 mmol/L (ref 22–32)
pCO2 arterial: 29.1 mmHg — ABNORMAL LOW (ref 32–48)
pH, Arterial: 7.479 — ABNORMAL HIGH (ref 7.35–7.45)
pO2, Arterial: 137 mmHg — ABNORMAL HIGH (ref 83–108)

## 2022-12-02 LAB — POCT I-STAT EG7
Acid-Base Excess: 0 mmol/L (ref 0.0–2.0)
Bicarbonate: 25.4 mmol/L (ref 20.0–28.0)
Calcium, Ion: 1.3 mmol/L (ref 1.15–1.40)
HCT: 37 % (ref 36.0–46.0)
Hemoglobin: 12.6 g/dL (ref 12.0–15.0)
O2 Saturation: 73 %
Potassium: 3.6 mmol/L (ref 3.5–5.1)
Sodium: 143 mmol/L (ref 135–145)
TCO2: 27 mmol/L (ref 22–32)
pCO2, Ven: 42.9 mmHg — ABNORMAL LOW (ref 44–60)
pH, Ven: 7.38 (ref 7.25–7.43)
pO2, Ven: 39 mmHg (ref 32–45)

## 2022-12-02 LAB — GLUCOSE, CAPILLARY
Glucose-Capillary: 155 mg/dL — ABNORMAL HIGH (ref 70–99)
Glucose-Capillary: 146 mg/dL — ABNORMAL HIGH (ref 70–99)
Glucose-Capillary: 147 mg/dL — ABNORMAL HIGH (ref 70–99)
Glucose-Capillary: 76 mg/dL (ref 70–99)
Glucose-Capillary: 108 mg/dL — ABNORMAL HIGH (ref 70–99)
Glucose-Capillary: 298 mg/dL — ABNORMAL HIGH (ref 70–99)

## 2022-12-02 SURGERY — RIGHT HEART CATH AND CORONARY ANGIOGRAPHY
Anesthesia: Moderate Sedation | Laterality: Bilateral

## 2022-12-02 MED ORDER — NITROGLYCERIN 1 MG/10 ML FOR IR/CATH LAB
INTRA_ARTERIAL | Status: DC | PRN
  Administered 2022-12-02 (×2): 200 ug via INTRACORONARY

## 2022-12-02 MED ORDER — ACETAMINOPHEN 325 MG PO TABS: 650.0000 mg | ORAL_TABLET | ORAL | Status: DC | PRN

## 2022-12-02 MED ORDER — NITROGLYCERIN 1 MG/10 ML FOR IR/CATH LAB
INTRA_ARTERIAL | Status: AC
  Filled 2022-12-02: qty 10

## 2022-12-02 MED ORDER — ISOSORBIDE MONONITRATE ER 30 MG PO TB24: 15.0000 mg | ORAL_TABLET | Freq: Every day | ORAL | 6 refills | Status: DC

## 2022-12-02 MED ORDER — FENTANYL CITRATE (PF) 100 MCG/2ML IJ SOLN
INTRAMUSCULAR | Status: AC
  Filled 2022-12-02: qty 2

## 2022-12-02 MED ORDER — GLUCAGON HCL RDNA (DIAGNOSTIC) 1 MG IJ SOLR
INTRAMUSCULAR | Status: AC
  Filled 2022-12-02: qty 1

## 2022-12-02 MED ORDER — SODIUM CHLORIDE 0.9 % IV SOLN: INTRAVENOUS | Status: DC

## 2022-12-02 MED ORDER — VERAPAMIL HCL 2.5 MG/ML IV SOLN
INTRAVENOUS | Status: DC | PRN
  Administered 2022-12-02 (×2): 2.5 mg via INTRA_ARTERIAL

## 2022-12-02 MED ORDER — FENTANYL CITRATE (PF) 100 MCG/2ML IJ SOLN
INTRAMUSCULAR | Status: DC | PRN
  Administered 2022-12-02 (×3): 12.5 ug via INTRAVENOUS

## 2022-12-02 MED ORDER — HEPARIN (PORCINE) IN NACL 1000-0.9 UT/500ML-% IV SOLN
INTRAVENOUS | Status: AC
  Filled 2022-12-02: qty 1000

## 2022-12-02 MED ORDER — DIPHENHYDRAMINE HCL 50 MG/ML IJ SOLN
INTRAMUSCULAR | Status: AC
  Filled 2022-12-02: qty 1

## 2022-12-02 MED ORDER — MIDAZOLAM HCL 2 MG/2ML IJ SOLN
INTRAMUSCULAR | Status: DC | PRN
  Administered 2022-12-02 (×2): .5 mg via INTRAVENOUS

## 2022-12-02 MED ORDER — METHYLPREDNISOLONE SODIUM SUCC 125 MG IJ SOLR
40.0000 mg | INTRAMUSCULAR | Status: AC
  Administered 2022-12-02: 40 mg via INTRAVENOUS

## 2022-12-02 MED ORDER — DEXTROSE 50 % IV SOLN
INTRAVENOUS | Status: AC
  Administered 2022-12-02: 25 mL via INTRAVENOUS
  Filled 2022-12-02: qty 50

## 2022-12-02 MED ORDER — SODIUM CHLORIDE 0.9 % IV SOLN: 250.0000 mL | INTRAVENOUS | Status: DC | PRN

## 2022-12-02 MED ORDER — INSULIN REGULAR HUMAN 100 UNIT/ML IJ SOLN
0.0000 [IU] | INTRAMUSCULAR | Status: DC
  Filled 2022-12-02: qty 0.09

## 2022-12-02 MED ORDER — METHYLPREDNISOLONE SODIUM SUCC 125 MG IJ SOLR
INTRAMUSCULAR | Status: AC
  Administered 2022-12-02: 40 mg via INTRAVENOUS
  Filled 2022-12-02: qty 2

## 2022-12-02 MED ORDER — ONDANSETRON HCL 4 MG/2ML IJ SOLN: 4.0000 mg | Freq: Four times a day (QID) | INTRAMUSCULAR | Status: DC | PRN

## 2022-12-02 MED ORDER — DEXTROSE 50 % IV SOLN: 25.0000 mL | Freq: Once | INTRAVENOUS | Status: AC

## 2022-12-02 MED ORDER — INSULIN REGULAR HUMAN 100 UNIT/ML IJ SOLN
0.0000 [IU] | INTRAMUSCULAR | Status: DC
  Filled 2022-12-02: qty 3

## 2022-12-02 MED ORDER — HEPARIN SODIUM (PORCINE) 1000 UNIT/ML IJ SOLN
INTRAMUSCULAR | Status: AC
  Filled 2022-12-02: qty 10

## 2022-12-02 MED ORDER — SODIUM CHLORIDE 0.9% FLUSH: 3.0000 mL | Freq: Two times a day (BID) | INTRAVENOUS | Status: DC

## 2022-12-02 MED ORDER — VERAPAMIL HCL 2.5 MG/ML IV SOLN
INTRAVENOUS | Status: AC
  Filled 2022-12-02: qty 2

## 2022-12-02 MED ORDER — NITROGLYCERIN 0.4 MG SL SUBL: 0.4000 mg | SUBLINGUAL_TABLET | SUBLINGUAL | 99 refills | Status: DC | PRN

## 2022-12-02 MED ORDER — HEPARIN SODIUM (PORCINE) 1000 UNIT/ML IJ SOLN
INTRAMUSCULAR | Status: DC | PRN
  Administered 2022-12-02: 3500 [IU] via INTRAVENOUS

## 2022-12-02 MED ORDER — DIPHENHYDRAMINE HCL 50 MG/ML IJ SOLN
25.0000 mg | Freq: Once | INTRAMUSCULAR | Status: AC
  Administered 2022-12-02: 25 mg via INTRAVENOUS

## 2022-12-02 MED ORDER — SODIUM CHLORIDE 0.9% FLUSH: 3.0000 mL | INTRAVENOUS | Status: DC | PRN

## 2022-12-02 MED ORDER — HEPARIN (PORCINE) IN NACL 1000-0.9 UT/500ML-% IV SOLN
INTRAVENOUS | Status: DC | PRN
  Administered 2022-12-02: 1000 mL

## 2022-12-02 MED ORDER — IOHEXOL 300 MG/ML  SOLN
INTRAMUSCULAR | Status: DC | PRN
  Administered 2022-12-02: 35 mL

## 2022-12-02 MED ORDER — HYDRALAZINE HCL 20 MG/ML IJ SOLN: 10.0000 mg | INTRAMUSCULAR | Status: DC | PRN

## 2022-12-02 MED ORDER — MIDAZOLAM HCL 2 MG/2ML IJ SOLN
INTRAMUSCULAR | Status: AC
  Filled 2022-12-02: qty 2

## 2022-12-02 SURGICAL SUPPLY — 13 items
CATH 5F 110X4 TIG (CATHETERS) IMPLANT
CATH BALLN WEDGE 5F 110CM (CATHETERS) IMPLANT
DEVICE RAD TR BAND REGULAR (VASCULAR PRODUCTS) IMPLANT
DRAPE BRACHIAL (DRAPES) IMPLANT
GLIDESHEATH SLEND SS 6F .021 (SHEATH) IMPLANT
GUIDEWIRE INQWIRE 1.5J.035X260 (WIRE) IMPLANT
INQWIRE 1.5J .035X260CM (WIRE) ×1
PACK CARDIAC CATH (CUSTOM PROCEDURE TRAY) ×1 IMPLANT
PROTECTION STATION PRESSURIZED (MISCELLANEOUS) ×1
SET ATX-X65L (MISCELLANEOUS) IMPLANT
SHEATH GLIDE SLENDER 4/5FR (SHEATH) IMPLANT
STATION PROTECTION PRESSURIZED (MISCELLANEOUS) IMPLANT
WIRE HITORQ VERSACORE ST 145CM (WIRE) IMPLANT

## 2022-12-02 NOTE — Interval H&P Note (Signed)
History and Physical Interval Note:  12/02/2022 9:42 AM  [name]  has presented today for surgery, with the diagnosis of chest pain and chronic HFpEF.  The various methods of treatment have been discussed with the patient and family. After consideration of risks, benefits and other options for treatment, the patient has consented to  Procedure(s): RIGHT/LEFT HEART CATH AND CORONARY ANGIOGRAPHY (Bilateral) as a surgical intervention.  The patient's history has been reviewed, patient examined, no change in status, stable for surgery.  I have reviewed the patient's chart and labs.  Questions were answered to the patient's satisfaction.    Cath Lab Visit (complete for each Cath Lab visit)  Clinical Evaluation Leading to the Procedure:   ACS: No.  Non-ACS:    Anginal/Heart Failure Classification: NYHA class III  Anti-ischemic medical therapy: Minimal Therapy (1 class of medications)  Non-Invasive Test Results: Normal coronary CTA (10/2021)  Prior CABG: No previous CABG  Harrell Gave Darral Rishel

## 2022-12-02 NOTE — Telephone Encounter (Signed)
Cassandra Benson is a 82 year old female who has a left and right heart cath scheduled for today.  While on the way to the [facility] hospital for her procedure, she felt hypoglycemic.  She does not have any candy or orange juice near her.  She does have a bottle of Boost.  I instructed the patient to drink a little bit Boost to see if she can get her blood sugar up a little.  In this case, fixing hypoglycemia take precedence over to the procedure.  Hopefully with a small amount of boost shake, we can still do the procedure today

## 2022-12-02 NOTE — Telephone Encounter (Signed)
Thank you for the update.  Blood glucose was 155 when she arrived for her cath.  Nelva Bush, MD [facility]

## 2022-12-02 NOTE — Progress Notes (Signed)
Family at bedside, pt eating sandwich tray and soda

## 2022-12-05 ENCOUNTER — Encounter: Payer: Self-pay | Admitting: Internal Medicine

## 2022-12-20 ENCOUNTER — Encounter: Payer: Self-pay | Admitting: Family Medicine

## 2022-12-20 ENCOUNTER — Ambulatory Visit (INDEPENDENT_AMBULATORY_CARE_PROVIDER_SITE_OTHER): Payer: PPO | Admitting: Family Medicine

## 2022-12-20 ENCOUNTER — Telehealth: Payer: Self-pay

## 2022-12-20 ENCOUNTER — Encounter: Payer: Self-pay | Admitting: "Endocrinology

## 2022-12-20 VITALS — BP 124/68 | HR 90 | Temp 97.3°F | Ht 63.0 in | Wt 154.4 lb

## 2022-12-20 DIAGNOSIS — G8929 Other chronic pain: Secondary | ICD-10-CM

## 2022-12-20 DIAGNOSIS — I201 Angina pectoris with documented spasm: Secondary | ICD-10-CM | POA: Diagnosis not present

## 2022-12-20 DIAGNOSIS — R0609 Other forms of dyspnea: Secondary | ICD-10-CM

## 2022-12-20 DIAGNOSIS — R079 Chest pain, unspecified: Secondary | ICD-10-CM | POA: Diagnosis not present

## 2022-12-20 DIAGNOSIS — E13319 Other specified diabetes mellitus with unspecified diabetic retinopathy without macular edema: Secondary | ICD-10-CM | POA: Diagnosis not present

## 2022-12-20 DIAGNOSIS — M546 Pain in thoracic spine: Secondary | ICD-10-CM

## 2022-12-20 DIAGNOSIS — I5032 Chronic diastolic (congestive) heart failure: Secondary | ICD-10-CM | POA: Diagnosis not present

## 2022-12-20 NOTE — Progress Notes (Signed)
Patient ID: Cassandra Benson, female    DOB: 07-13-1941, 82 y.o.   MRN: [id number]  This visit was conducted in person.  BP 124/68   Pulse 90   Temp (!) 97.3 F (36.3 C) (Temporal)   Ht 5\' 3"  (1.6 m)   Wt 154 lb 6 oz (70 kg)   SpO2 96%   BMI 27.35 kg/m    CC: 1 mo f/u visit  Subjective:   HPI: Cassandra Benson is a 82 y.o. female presenting on 12/20/2022 for Medical Management of Chronic Issues (Here for 1 mo f/u.)   Established with advanced CHF clinic Dr Daniel Nones - started on spironolactone 12.5mg  daily, underwent R heart catheterization 12/02/2022 by Dr End: Conclusions: No critical coronary artery disease.  There is up to ~50% stenosis at the ostium of the RCA with pressure dampening noted using 47F diagnostic catheter.  Slight improvement noted with intracoronary nitroglycerin suggesting at least some component of vasospasm.  No angiographically significant coronary artery disease noted in the left coronary artery. Normal left and right heart filling pressures. Normal Fick cardiac output/index. Small right radial artery with significant vasospasm throughout the procedure.  Recommend alternate access for future catheterizations. Recommendations: Add isosorbide mononitrate 15 mg daily and as needed sublingual nitroglycerin for antianginal therapy, including possible coronary vasospasm. Secondary prevention of ASCVD. Maintain net even fluid balance.  Possible coronary vasospasm causing symptoms, rec imdur 15mg  daily with PRN SL nitro. She notes procedure was very painful.  She feels Imdur has helped her right sided chest pain.   LADA - pending endocrinology appt with [facility] endo 12/2022. Continues tresiba 60u daily. Brings log showing cbgs from 60s to 300s, one isolated high to 469.  Worried about cost of medicines "medicine poor". Trelegy inhaler costs $200/month.  Saw physiatry for chronic thoracic back pain - planned PT has not started yet.      Relevant  past medical, surgical, family and social history reviewed and updated as indicated. Interim medical history since our last visit reviewed. Allergies and medications reviewed and updated. Outpatient Medications Prior to Visit  Medication Sig Dispense Refill   acetaminophen (TYLENOL) 500 MG tablet Take 1,500 mg by mouth daily.     albuterol (VENTOLIN HFA) 108 (90 Base) MCG/ACT inhaler Inhale 2 puffs into the lungs every 6 (six) hours as needed. 8 g 2   Ascorbic Acid (VITAMIN C) 100 MG tablet Take 100 mg by mouth daily.     aspirin EC 81 MG tablet Take 1 tablet (81 mg total) by mouth daily. Swallow whole. 30 tablet 12   Baclofen 5 MG TABS Take 1-2 tablets (5-10 mg total) by mouth at bedtime as needed (neck pain). 30 tablet 3   Cholecalciferol (VITAMIN D3) 25 MCG (1000 UT) CAPS Take 2 capsules (2,000 Units total) by mouth daily.     clopidogrel (PLAVIX) 75 MG tablet TAKE ONE TABLET BY MOUTH EVERY EVENING 30 tablet 6   diclofenac Sodium (VOLTAREN) 1 % GEL Apply 2 g topically 3 (three) times daily. 100 g 1   Glucagon, rDNA, (GLUCAGON EMERGENCY) 1 MG KIT INJECT into THE muscle ONCE AS NEEDED FOR emergency 1 kit 12   insulin degludec (TRESIBA FLEXTOUCH) 100 UNIT/ML FlexTouch Pen Inject 60 Units into the skin at bedtime. 54 mL 1   isosorbide mononitrate (IMDUR) 30 MG 24 hr tablet Take 0.5 tablets (15 mg total) by mouth daily. 30 tablet 6   losartan (COZAAR) 25 MG tablet Take 1 tablet (25 mg total)  by mouth daily. 90 tablet 3   metoprolol succinate (TOPROL XL) 25 MG 24 hr tablet Take 1 tablet (25 mg total) by mouth daily. 30 tablet 5   Multiple Vitamin (MULTIVITAMIN ADULT) TABS Take 1 tablet by mouth in the morning and at bedtime.     nitroGLYCERIN (NITROSTAT) 0.4 MG SL tablet Place 1 tablet (0.4 mg total) under the tongue every 5 (five) minutes as needed for chest pain. 25 tablet PRN   rosuvastatin (CRESTOR) 20 MG tablet Take 1 tablet (20 mg total) by mouth every evening. 90 tablet 4   spironolactone  (ALDACTONE) 25 MG tablet Take 0.5 tablets (12.5 mg total) by mouth daily. 90 tablet 3   SYNTHROID 75 MCG tablet TAKE ONE TABLET BY MOUTH Monday-Saturday BEFORE breakfast 80 tablet 4   topiramate (TOPAMAX) 50 MG tablet Take 1 tablet (50 mg total) by mouth 2 (two) times daily. 60 tablet 12   torsemide (DEMADEX) 20 MG tablet Take 1 tablet (20 mg total) by mouth daily. 30 tablet 5   TRELEGY ELLIPTA 200-62.5-25 MCG/ACT AEPB INHALE 1 PUFF BY MOUTH INTO LUNGS ONCE daily 60 each 1   No facility-administered medications prior to visit.     Per HPI unless specifically indicated in ROS section below Review of Systems  Objective:  BP 124/68   Pulse 90   Temp (!) 97.3 F (36.3 C) (Temporal)   Ht 5\' 3"  (1.6 m)   Wt 154 lb 6 oz (70 kg)   SpO2 96%   BMI 27.35 kg/m   Wt Readings from Last 3 Encounters:  12/20/22 154 lb 6 oz (70 kg)  12/02/22 145 lb (65.8 kg)  11/25/22 152 lb (68.9 kg)      Physical Exam Vitals and nursing note reviewed.  Constitutional:      Appearance: Normal appearance. She is not ill-appearing.  HENT:     Mouth/Throat:     Mouth: Mucous membranes are moist.     Pharynx: Oropharynx is clear. No oropharyngeal exudate or posterior oropharyngeal erythema.  Cardiovascular:     Rate and Rhythm: Normal rate and regular rhythm.     Pulses: Normal pulses.     Heart sounds: Normal heart sounds. No murmur heard. Pulmonary:     Effort: Pulmonary effort is normal. No respiratory distress.     Breath sounds: Normal breath sounds. No wheezing, rhonchi or rales.  Musculoskeletal:     Right lower leg: No edema.     Left lower leg: No edema.  Skin:    General: Skin is warm and dry.     Findings: No rash.  Neurological:     Mental Status: She is alert.  Psychiatric:        Mood and Affect: Mood normal.        Behavior: Behavior normal.       Lab Results  Component Value Date   CREATININE 0.91 11/25/2022   BUN 20 11/25/2022   NA 142 12/02/2022   K 3.6 12/02/2022   CL 97  (L) 11/25/2022   CO2 33 (H) 11/25/2022    Lab Results  Component Value Date   HGBA1C 11.4 (H) 10/28/2022    Assessment & Plan:   Problem List Items Addressed This Visit     Chest pain    Established with CHF clinic s/p R heat catheterization 11/2022 showing 50% stenosis at RCA ostium and component of vasospasm - rec isosorbide mononitrate 15mg  daily with PRN SL nitro.       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  Ongoing brittle diabetes.  Background DR on latest DM eye exam 07/2022 [facility]).       Chronic midline thoracic back pain    Appreciate PM&R care ([place]) pending PT course then f/u with physiatry.       Chronic dyspnea    Mild obstructive airway disease per PFTs 03/2022 followed by pulm, now on breztri.  Notes difficulty with cost - advised f/u with pulm regarding options, see if she is candidat for patient assistance program.       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 Notes improvement in chest pain since starting imdur.         No orders of the defined types were placed in this encounter.   No orders of the defined types were placed in this encounter.   Patient Instructions  Call Dr Patsey Berthold about cost of Trelegy - to see if you qualify for patient assistance.  Stay off furosemide (lasix) as you're now taking torsemide.  I'm glad you're seeing endocrinology next month! Continue tresiba 60 units daily for now.  Good to see you today - keep appointment in May.   Follow up plan: Return in about 6 weeks (around 02/03/2023) for follow up visit.  [name], MD

## 2022-12-20 NOTE — Telephone Encounter (Signed)
Letter has been scanned and emailed to [employer] per her request.  Nothing further needed.

## 2022-12-20 NOTE — Patient Instructions (Addendum)
Call Dr Patsey Berthold about cost of Trelegy - to see if you qualify for patient assistance.  Stay off furosemide (lasix) as you're now taking torsemide.  I'm glad you're seeing endocrinology next month! Continue tresiba 60 units daily for now.  Good to see you today - keep appointment in May.

## 2022-12-20 NOTE — Telephone Encounter (Signed)
Received hand written letter from patient concerning her recent ED visit. Patient was not satisfied with care provided by [facility] ED department on 11/16/2022.  Spoke to patient, she expressed her concerns with recent ED visit. I provided her with instructions on how to complete service complaint form online. I apologized for an unpleasant experience. Patient voiced her understanding and had no further questions or concerns at this time.   I have reached out to [facility] with patient experience to ask if the letter needs to be reviewed or if it can be shredded. Will await response.

## 2022-12-23 ENCOUNTER — Ambulatory Visit
Admission: RE | Admit: 2022-12-23 | Discharge: 2022-12-23 | Disposition: A | Discharge status: Home / Self Care | Payer: PPO | Source: Ambulatory Visit | Attending: Cardiology | Admitting: Cardiology

## 2022-12-23 DIAGNOSIS — E785 Hyperlipidemia, unspecified: Secondary | ICD-10-CM | POA: Insufficient documentation

## 2022-12-23 DIAGNOSIS — I5032 Chronic diastolic (congestive) heart failure: Secondary | ICD-10-CM | POA: Diagnosis not present

## 2022-12-23 DIAGNOSIS — I110 Hypertensive heart disease with heart failure: Secondary | ICD-10-CM | POA: Insufficient documentation

## 2022-12-23 DIAGNOSIS — E119 Type 2 diabetes mellitus without complications: Secondary | ICD-10-CM | POA: Diagnosis not present

## 2022-12-23 LAB — ECHOCARDIOGRAM COMPLETE
AR max vel: 3 cm2
AV Area VTI: 2.68 cm2
AV Area mean vel: 2.54 cm2
AV Mean grad: 3 mmHg
AV Peak grad: 4.8 mmHg
Ao pk vel: 1.1 m/s
Area-P 1/2: 3.4 cm2
MV VTI: 1.72 cm2
S' Lateral: 2.5 cm

## 2022-12-23 NOTE — Progress Notes (Signed)
*  PRELIMINARY RESULTS* Echocardiogram 2D Echocardiogram has been performed.  Cassandra Benson 12/23/2022, 11:34 AM

## 2022-12-24 ENCOUNTER — Encounter: Payer: Self-pay | Admitting: Family Medicine

## 2022-12-24 DIAGNOSIS — I201 Angina pectoris with documented spasm: Secondary | ICD-10-CM | POA: Insufficient documentation

## 2022-12-24 NOTE — Assessment & Plan Note (Signed)
Continues tresiba 60u daily - with upcoming endocrinology appt at [place].  Ongoing brittle diabetes.  Background DR on latest DM eye exam 07/2022 [facility]).

## 2022-12-24 NOTE — Assessment & Plan Note (Signed)
Suspicion for this on recent R heart catheterization - started on imdur 15mg  daily with prn SL nitro. Notes improvement in chest pain since starting imdur.

## 2022-12-24 NOTE — Assessment & Plan Note (Signed)
Appreciate PM&R care (Cassandra Benson) pending PT course then f/u with physiatry.

## 2022-12-24 NOTE — Assessment & Plan Note (Addendum)
Mild obstructive airway disease per PFTs 03/2022 followed by pulm, now on breztri.  Notes difficulty with cost - advised f/u with pulm regarding options, see if she is candidat for patient assistance program.

## 2022-12-24 NOTE — Assessment & Plan Note (Signed)
Appreciate CHF clinic care Dr Daniel Nones

## 2022-12-24 NOTE — Assessment & Plan Note (Addendum)
Established with CHF clinic s/p R heat catheterization 11/2022 showing 50% stenosis at RCA ostium and component of vasospasm - rec isosorbide mononitrate 15mg  daily with PRN SL nitro.

## 2022-12-25 ENCOUNTER — Encounter: Payer: Self-pay | Admitting: Family Medicine

## 2023-01-13 ENCOUNTER — Emergency Department: Payer: PPO

## 2023-01-13 ENCOUNTER — Emergency Department
Admission: EM | Admit: 2023-01-13 | Discharge: 2023-01-13 | Disposition: A | Discharge status: Home / Self Care | Payer: PPO | Attending: Emergency Medicine | Admitting: Emergency Medicine

## 2023-01-13 ENCOUNTER — Telehealth: Payer: Self-pay

## 2023-01-13 ENCOUNTER — Other Ambulatory Visit: Payer: Self-pay

## 2023-01-13 ENCOUNTER — Encounter: Payer: Self-pay | Admitting: Intensive Care

## 2023-01-13 DIAGNOSIS — R600 Localized edema: Secondary | ICD-10-CM | POA: Diagnosis not present

## 2023-01-13 DIAGNOSIS — E876 Hypokalemia: Secondary | ICD-10-CM | POA: Insufficient documentation

## 2023-01-13 DIAGNOSIS — I959 Hypotension, unspecified: Secondary | ICD-10-CM | POA: Diagnosis not present

## 2023-01-13 DIAGNOSIS — R0789 Other chest pain: Secondary | ICD-10-CM | POA: Insufficient documentation

## 2023-01-13 DIAGNOSIS — R0602 Shortness of breath: Secondary | ICD-10-CM | POA: Diagnosis not present

## 2023-01-13 DIAGNOSIS — I5030 Unspecified diastolic (congestive) heart failure: Secondary | ICD-10-CM | POA: Insufficient documentation

## 2023-01-13 DIAGNOSIS — M7989 Other specified soft tissue disorders: Secondary | ICD-10-CM

## 2023-01-13 DIAGNOSIS — R0600 Dyspnea, unspecified: Secondary | ICD-10-CM | POA: Diagnosis not present

## 2023-01-13 LAB — CBC WITH DIFFERENTIAL/PLATELET
Abs Immature Granulocytes: 0.03 10*3/uL (ref 0.00–0.07)
Basophils Absolute: 0.1 10*3/uL (ref 0.0–0.1)
Basophils Relative: 1 %
Eosinophils Absolute: 0.1 10*3/uL (ref 0.0–0.5)
Eosinophils Relative: 1 %
HCT: 40.5 % (ref 36.0–46.0)
Hemoglobin: 13.1 g/dL (ref 12.0–15.0)
Immature Granulocytes: 0 %
Lymphocytes Relative: 26 %
Lymphs Abs: 1.8 10*3/uL (ref 0.7–4.0)
MCH: 27.3 pg (ref 26.0–34.0)
MCHC: 32.3 g/dL (ref 30.0–36.0)
MCV: 84.6 fL (ref 80.0–100.0)
Monocytes Absolute: 0.5 10*3/uL (ref 0.1–1.0)
Monocytes Relative: 7 %
Neutro Abs: 4.7 10*3/uL (ref 1.7–7.7)
Neutrophils Relative %: 65 %
Platelets: 233 10*3/uL (ref 150–400)
RBC: 4.79 MIL/uL (ref 3.87–5.11)
RDW: 13.7 % (ref 11.5–15.5)
WBC: 7.2 10*3/uL (ref 4.0–10.5)
nRBC: 0 % (ref 0.0–0.2)

## 2023-01-13 LAB — COMPREHENSIVE METABOLIC PANEL
ALT: 17 U/L (ref 0–44)
AST: 24 U/L (ref 15–41)
Albumin: 3.4 g/dL — ABNORMAL LOW (ref 3.5–5.0)
Alkaline Phosphatase: 79 U/L (ref 38–126)
Anion gap: 9 (ref 5–15)
BUN: 12 mg/dL (ref 8–23)
CO2: 22 mmol/L (ref 22–32)
Calcium: 9.1 mg/dL (ref 8.9–10.3)
Chloride: 112 mmol/L — ABNORMAL HIGH (ref 98–111)
Creatinine, Ser: 0.93 mg/dL (ref 0.44–1.00)
GFR, Estimated: >60 mL/min (ref >60)
Glucose, Bld: 119 mg/dL — ABNORMAL HIGH (ref 70–99)
Potassium: 3.3 mmol/L — ABNORMAL LOW (ref 3.5–5.1)
Sodium: 143 mmol/L (ref 135–145)
Total Bilirubin: 0.6 mg/dL (ref 0.3–1.2)
Total Protein: 6.6 g/dL (ref 6.5–8.1)

## 2023-01-13 LAB — CBG MONITORING, ED: Glucose-Capillary: 117 mg/dL — ABNORMAL HIGH (ref 70–99)

## 2023-01-13 LAB — D-DIMER, QUANTITATIVE: D-Dimer, Quant: 1.27 ug/mL-FEU — ABNORMAL HIGH (ref 0.00–0.50)

## 2023-01-13 LAB — BRAIN NATRIURETIC PEPTIDE: B Natriuretic Peptide: 74.4 pg/mL (ref 0.0–100.0)

## 2023-01-13 LAB — TROPONIN I (HIGH SENSITIVITY)
Troponin I (High Sensitivity): 4 ng/L (ref <18)
Troponin I (High Sensitivity): 4 ng/L (ref <18)

## 2023-01-13 MED ORDER — POTASSIUM CHLORIDE CRYS ER 20 MEQ PO TBCR
40.0000 meq | EXTENDED_RELEASE_TABLET | Freq: Once | ORAL | Status: AC
  Administered 2023-01-13: 40 meq via ORAL
  Filled 2023-01-13: qty 2

## 2023-01-13 MED ORDER — TECHNETIUM TO 99M ALBUMIN AGGREGATED
4.0000 | Freq: Once | INTRAVENOUS | Status: AC | PRN
  Administered 2023-01-13: 4.36 via INTRAVENOUS

## 2023-01-13 NOTE — Telephone Encounter (Signed)
Noted. Currently being evaluated in ER with elevated D dimer, normal VQ scan, LE [place] pending.

## 2023-01-13 NOTE — Discharge Instructions (Signed)
You are seen in the emergency department with lower leg swelling.  You had a chest x-ray that did not show any fluids on your lungs.  We worked you up for a blood clot in your lungs.  It is importantly follow-up closely with your cardiologist and primary care provider.  Your potassium was mildly low when checked today.  Make sure to follow up with a primary doctor to follow up your labs.  Make sure to eat food high in potassium and magnesium - examples - potatoes, spinach, bananas, beans, avocadoes, oranges, nuts.

## 2023-01-13 NOTE — ED Provider Notes (Addendum)
[facility] Provider Note    Event Date/Time   First MD Initiated Contact with Patient 01/13/23 1345     (approximate)   History   Shortness of Breath   HPI  Cassandra Benson is a 82 y.o. female past medical history significant for chronic back pain, chronic dyspnea, HFpEF, coronary artery vasospasms, who presents to the emergency department with shortness of breath and leg swelling.  Patient states over the past couple of days she has noted some swelling to her lower legs.  Yesterday had an episode of chest pain to her right upper shoulder and chest.  Worse with deep inspiration.  Mild shortness of breath which she states is her normal.  Denies any nausea vomiting or diaphoresis.  Endorses a remote history of pulmonary embolism or DVT in the past, does not believe that she is currently on any anticoagulation.  Denies any falls or trauma.  Denies any nausea or vomiting.  Recent cardiac catheterization with Dr. Okey Dupre that showed no signs of coronary artery disease.     Physical Exam   Triage Vital Signs: ED Triage Vitals  Enc Vitals Group     BP 01/13/23 1151 134/63     Pulse Rate 01/13/23 1151 79     Resp 01/13/23 1151 20     Temp 01/13/23 1148 98.4 F (36.9 C)     Temp Source 01/13/23 1148 Oral     SpO2 01/13/23 1151 100 %     Weight 01/13/23 1149 143 lb (64.9 kg)     Height 01/13/23 1149  (1.6 m)     Head Circumference --      Peak Flow --      Pain Score 01/13/23 1149 0     Pain Loc --      Pain Edu? --      Excl. in GC? --     Most recent vital signs: Vitals:   01/13/23 1151 01/13/23 1355  BP: 134/63 115/62  Pulse: 79 81  Resp: 20 (!) 23  Temp:    SpO2: 100% 99%    Physical Exam Constitutional:      Appearance: She is well-developed.  HENT:     Head: Atraumatic.  Eyes:     Conjunctiva/sclera: Conjunctivae normal.  Cardiovascular:     Rate and Rhythm: Regular rhythm.  Pulmonary:     Effort: No respiratory distress.      Breath sounds: No wheezing, rhonchi or rales.  Abdominal:     General: There is no distension.  Musculoskeletal:        General: Normal range of motion.     Cervical back: Normal range of motion.     Right lower leg: Edema present.     Left lower leg: Edema present.     Comments: Trace pitting edema to bilateral lower extremities  Skin:    General: Skin is warm.     Capillary Refill: Capillary refill takes less than 2 seconds.  Neurological:     Mental Status: She is alert. Mental status is at baseline.     IMPRESSION / MDM / ASSESSMENT AND PLAN / ED COURSE  I reviewed the triage vital signs and the nursing notes.  Differential diagnosis including pulmonary embolism, DVT, heart failure, pneumonia, venous stasis.  On chart review patient had a recent coronary catheterization that showed no signs of coronary artery disease.  EKG  I, Corena Herter, the attending physician, personally viewed and interpreted this ECG.   Rate:  Normal  Rhythm: Normal sinus  Axis: Normal  Intervals: Normal  ST&T Change: None  No tachycardic or bradycardic dysrhythmias while on cardiac telemetry.  RADIOLOGY I independently reviewed imaging, my interpretation of imaging: Chest x-ray with no signs of pneumonia no signs of pulmonary edema  LABS (all labs ordered are listed, but only abnormal results are displayed) Labs interpreted as -    Labs Reviewed  COMPREHENSIVE METABOLIC PANEL - Abnormal; Notable for the following components:      Result Value   Potassium 3.3 (*)    Chloride 112 (*)    Glucose, Bld 119 (*)    Albumin 3.4 (*)    All other components within normal limits  D-DIMER, QUANTITATIVE - Abnormal; Notable for the following components:   D-Dimer, Quant 1.27 (*)    All other components within normal limits  CBG MONITORING, ED - Abnormal; Notable for the following components:   Glucose-Capillary 117 (*)    All other components within normal limits  CBC WITH  DIFFERENTIAL/PLATELET  BRAIN NATRIURETIC PEPTIDE  TROPONIN I (HIGH SENSITIVITY)  TROPONIN I (HIGH SENSITIVITY)     MDM  Low risk Wells criteria, elevated D-dimer.  Patient has an allergy to IV contrast so we will do a perfusion study to further evaluate for possibly pulmonary embolism.  Normal BNP with no signs of heart failure.  Mild low potassium at 3.3 and was given oral replacement.  If VQ scan without signs of pulmonary embolism plan to discharge home with compression socks, leg elevation and close follow-up with primary care provider and cardiology.     PROCEDURES:  Critical Care performed: No  Procedures  Patient's presentation is most consistent with acute presentation with potential threat to life or bodily function.   MEDICATIONS ORDERED IN ED: Medications  potassium chloride SA (KLOR-CON M) CR tablet 40 mEq (has no administration in time range)    FINAL CLINICAL IMPRESSION(S) / ED DIAGNOSES   Final diagnoses:  SOB (shortness of breath)  Leg swelling     Rx / DC Orders   ED Discharge Orders     None        Note:  This document was prepared using Dragon voice recognition software and may include unintentional dictation errors.   Corena Herter, MD 01/13/23 1527    Corena Herter, MD 01/13/23 1527

## 2023-01-13 NOTE — Telephone Encounter (Signed)
Pt said yesterday and today has been bad in regards to feet and bilateral lower leg swelling, abd swelling also. Pt said on 01/12/23 pt felt tight in chest and tightness went into arm. Pt said no CP or tightness in chest this morning. Pt said this morning rt leg swelling has gone down some but still significant swelling in lt leg and abd swollen as if pt was pregnant. Pt is winded just sitting and talking on phone. Pt got up and walked short distance to give me BP reading and FBS reading earlier this morning. BP 110/54 P 84 and FBS 64. Pt has eaten and has not taken insulin due to low BS but pt said she feels better since eating. When walked short distance pt became more SOB and could not complete full sentences without stopping talking to catch breath. Pt said she has no energy and completely "worn out". Pt said she does not have oxygen in the home and her pulse ox is not working. Pt is going to go to [facility] ED now; pt said she will not drive and does not want to go by EMS. Pt said her neighbor will take her and if not pt will call 911.  I called pt back and she could not get her neighbor and pt is more SOB than when we talked earlier; pt asked me to call 911 and I did and stayed on phone with pt until EMS arrived. (Pt was by herself and her family is out of town due to a funeral). Sending note to Dr Reece Agar and G pool.

## 2023-01-13 NOTE — ED Triage Notes (Signed)
Arrived by EMS from home. Awoke this morning with worsening SOB. Denies pain.   History CHF and pulmonary disease  EMS vitals: 98% RA placed on 2L O2 for comfort 120/76b/p 76HR 143CBG 98.1oral

## 2023-01-13 NOTE — ED Provider Notes (Signed)
Signout received from previous provider pending a VTE workup, imaging studies negative and patient remained stable with normal hemodynamics in the emergency department.  Plan is for discharge with close PMD and cardiology follow-up.   Pilar Jarvis, MD 01/13/23 [phone]

## 2023-01-16 ENCOUNTER — Ambulatory Visit: Payer: HMO | Admitting: "Endocrinology

## 2023-01-16 DIAGNOSIS — E13319 Other specified diabetes mellitus with unspecified diabetic retinopathy without macular edema: Secondary | ICD-10-CM | POA: Diagnosis not present

## 2023-01-17 ENCOUNTER — Telehealth: Payer: Self-pay

## 2023-01-17 NOTE — Telephone Encounter (Signed)
        Patient  visited [facility] on 01/13/2023  for Shortness of Breath.   Telephone encounter attempt :  1st  A HIPAA compliant voice message was left requesting a return call.  Instructed patient to call back at [phone].   Nolyn Swab [facility]  [facility] Population Health Community Resource Care Guide   [email or web address]  ?? [phone]   Website: [email or web address]  [email or web address]

## 2023-01-18 ENCOUNTER — Encounter: Payer: Self-pay | Admitting: Family Medicine

## 2023-01-19 ENCOUNTER — Telehealth: Payer: Self-pay

## 2023-01-19 NOTE — Telephone Encounter (Signed)
     Patient  visit on 01/13/2023  at [facility] was for shortness of breath.  Have you been able to follow up with your primary care physician? Yes  The patient was or was not able to obtain any needed medicine or equipment. No medication prescribed.  Are there diet recommendations that you are having difficulty following? No  Patient expresses understanding of discharge instructions and education provided has no other needs at this time. Yes   Emon Miggins [facility]  [facility] Population Health Community Resource Care Guide   [email or web address]  ?? [phone]   Website: [email or web address]  [email or web address]

## 2023-01-26 ENCOUNTER — Ambulatory Visit: Payer: PPO | Admitting: Pulmonary Disease

## 2023-01-26 ENCOUNTER — Encounter: Payer: Self-pay | Admitting: Pulmonary Disease

## 2023-01-26 VITALS — BP 118/78 | HR 76 | Temp 97.7°F | Ht 63.0 in | Wt 157.0 lb

## 2023-01-26 DIAGNOSIS — I201 Angina pectoris with documented spasm: Secondary | ICD-10-CM | POA: Diagnosis not present

## 2023-01-26 DIAGNOSIS — J4530 Mild persistent asthma, uncomplicated: Secondary | ICD-10-CM

## 2023-01-26 DIAGNOSIS — R0602 Shortness of breath: Secondary | ICD-10-CM

## 2023-01-26 DIAGNOSIS — G4733 Obstructive sleep apnea (adult) (pediatric): Secondary | ICD-10-CM | POA: Diagnosis not present

## 2023-01-26 LAB — NITRIC OXIDE: Nitric Oxide: 26

## 2023-01-26 MED ORDER — TRELEGY ELLIPTA 100-62.5-25 MCG/ACT IN AEPB: 1.0000 | INHALATION_SPRAY | Freq: Every day | RESPIRATORY_TRACT | 11 refills | Status: DC

## 2023-01-26 MED ORDER — TRELEGY ELLIPTA 100-62.5-25 MCG/ACT IN AEPB: 1.0000 | INHALATION_SPRAY | Freq: Every day | RESPIRATORY_TRACT | 0 refills | Status: DC

## 2023-01-26 NOTE — Patient Instructions (Addendum)
Will resume Trelegy.  This will be 1 puff daily.  Please rinse your mouth well after use it.  You may continue using your albuterol (Ventolin) as needed.  We will see you in follow-up in 3 months time call sooner should any new problems arise.

## 2023-01-26 NOTE — Progress Notes (Signed)
Subjective:    Patient ID: Cassandra Benson, female    DOB: 04/30/1941, 82 y.o.   MRN: [id number] Patient Care Team: Eustaquio Boyden, MD as PCP - General (Family Medicine) Antonieta Iba, MD as PCP - Cardiology (Cardiology) Galen Manila, MD as Referring Physician (Ophthalmology)  Chief Complaint  Patient presents with   Follow-up    SOB with exertion. Some wheezing. Leg swelling. Dry cough.    HPI Patient is an 82 year old lifelong never smoker with a history as noted below, who presents for follow-up on the issue of dyspnea.  Last seen on 16 November 2022.  At that time had to be referred to the ED due to significant tachypnea with chest pain.  She had a negative evaluation for MI at that time.  Since that time she has had 1 more visit to the ED, on 12 January 2023, she presented with shortness of breath and chest pain.  She had a VQ scan that was negative for PE and normal Dopplers lower extremities.  This was performed as she had an elevated D-dimer.  Today she presents stating that she would like to restart Trelegy.  Recall she was being treated previously for mild persistent asthma with the Trelegy.  She however, had not been taking it for a month.  She states that she now feels this was helping her shortness of breath somewhat.  He does not endorse any other complaint today.  DATA 12/24/2021 echocardiogram: LVEF 60 to 65%, grade 1 DD, normal right ventricular systolic function.  Normal pulmonary artery systolic pressure.  Mild MR. 04/11/2022 CT chest high-resolution: Mild pulmonary fibrosis pattern with apical to basal gradient, no significant air trapping on expiratory phase.  Not consistent with UIP.  Findings are very mild. 04/22/2022 PFTs: FEV1 1.91 L or 101% predicted, FVC 2.98 L or 117% predicted, FEV1/FVC 64%.  No bronchodilator response, lung volumes normal.  Mild diffusion capacity impairment.  Consistent with mild obstructive airways disease. 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 08/01/2022 connective tissue disease panel, acetylcholine receptor assay: Negative (notably ANA negative). 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  Patient declined CPAP/oxygen. 12/02/2022 right and left heart cath: 50% stenosis of the ostium of the RCA with pressure dampening.  Improvement with intracoronary nitroglycerin jesting component of vasospasm.  No angiographically significant coronary artery disease noted in the left coronary artery.  Normal left and right heart filling pressures.  Significant vasospasm noted on the right radial artery. 12/23/2022 echocardiogram: LVEF 60 to 65%, normal LV function, grade 1 DD, no aortic or mitral valve dysfunction. 01/13/2023 chest x-ray PA and lateral: No active pulmonary disease. 01/13/2023 VQ scan: Negative for pulmonary emboli. 01/13/2023 lower extremity Dopplers: No evidence of deep venous thrombosis in either lower extremity.   Review of Systems A 10 point review of systems was performed and it is as noted above otherwise negative.  Patient Active Problem List   Diagnosis Date Noted   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   Chronic diastolic heart failure (HCC) 12/02/2022   Allergy to beta blocker 08/06/2022   Steroid-induced hyperglycemia 05/16/2022   Right thigh pain 05/16/2022   Dysuria 02/25/2022   Body aches 02/15/2022   Insomnia 02/01/2022   Osteopenia 01/15/2022   Chronic dyspnea 12/22/2021   Unsteadiness 02/13/2021   Atherosclerosis of aorta (HCC) 12/16/2020   Neck pain 12/03/2020   Fall with injury 09/01/2020   Grade  II diastolic dysfunction 08/01/2020   Chronic venous insufficiency 04/07/2020   Partial small bowel obstruction (HCC) 01/15/2020   Epistaxis 01/13/2020   Cognitive changes 10/22/2019   Right sided abdominal pain 10/07/2019   Skin lesion 09/14/2019   Renal insufficiency  09/14/2019   Pain in right buttock 06/18/2019   Chronic midline thoracic back pain 05/14/2019   Sjogren's syndrome without extraglandular involvement (HCC) 02/26/2019   Epigastric discomfort 12/17/2018   PAD (peripheral artery disease) (HCC) 10/04/2018   Raynaud disease 09/04/2018   Amaurosis fugax, both eyes 06/18/2018   TIA (transient ischemic attack) 05/21/2018   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   Facial paresthesia 02/07/2018   Pain and swelling of lower leg 12/25/2017   Fibromyalgia    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), bilateral 08/21/2017   Chronic right-sided headache 03/13/2017   Numbness and tingling of both lower extremities 03/13/2017   6th nerve palsy, 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   Fatty liver 02/13/2017   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) 10/03/2016   Acute pain of right shoulder 08/15/2016   Medicare annual wellness visit, subsequent 06/19/2015   Health maintenance examination 06/19/2015   Advanced care planning/counseling discussion 06/19/2015   Carotid stenosis, 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 06/19/2015   Vitamin D deficiency    Family history of premature CAD 06/02/2015   Chest pain 05/26/2015   Elevated testosterone level in female 09/04/2014   Hyperlipidemia associated with type 2 diabetes mellitus (HCC) 07/30/2014   Essential hypertension 07/17/2014   Hypothyroidism due to Hashimoto's thyroiditis 07/17/2014   OSA (obstructive sleep apnea) 08/22/2012   Chronic low back pain 02/27/2012   Positive ANA (antinuclear antibody) 01/06/2012   Social History   Tobacco Use   Smoking status: Never   Smokeless tobacco: Never  Substance Use Topics   Alcohol use: No   Allergies  Allergen Reactions   Iodinated Contrast Media Other (See Comments)    Itching (severe) and chest tightness   Penicillins Anaphylaxis, Swelling, Rash and Other (See Comments)    Has patient had a PCN reaction causing immediate rash,  facial/tongue/throat swelling, SOB or lightheadedness with hypotension: Yes Has patient had a PCN reaction causing severe rash involving mucus membranes or skin necrosis: yes - remotely Has patient had a PCN reaction that required hospitalization: occurred while hospitalized Has patient had a PCN reaction occurring within the last 10 years: No If all of the above answers are "NO", then may proceed with Cephalosporin use.    Amlodipine Swelling    Pedal edema   Anectine [Succinylcholine] Other (See Comments)    Brother went into cardiac arrest.   Carvedilol Dermatitis    Facial acne   Codeine Nausea Only   Gabapentin Other (See Comments)    Gait abnormality   Influenza Vaccines Other (See Comments)    Muscle weakness; unable to walk   Insulin Aspart Other (See Comments)    headache   Nortriptyline Other (See Comments)    Eye swelling and mouth drawed up   Prednisone     Marked severe hyperglycemia to oral and IM steroids   Valsartan Other (See Comments) and Cough    Allergy to generic only, "Hacking" cough   Zetia [Ezetimibe] Other (See Comments)    Bad muscle cramps   Erythromycin Rash and Swelling   Sulfa Antibiotics Rash   Current Meds  Medication Sig   acetaminophen (TYLENOL) 500 MG tablet Take 1,500 mg by mouth daily.   albuterol (VENTOLIN HFA) 108 (90 Base) MCG/ACT inhaler  Inhale 2 puffs into the lungs every 6 (six) hours as needed.   Ascorbic Acid (VITAMIN C) 100 MG tablet Take 100 mg by mouth daily.   aspirin EC 81 MG tablet Take 1 tablet (81 mg total) by mouth daily. Swallow whole.   Baclofen 5 MG TABS Take 1-2 tablets (5-10 mg total) by mouth at bedtime as needed (neck pain).   Cholecalciferol (VITAMIN D3) 25 MCG (1000 UT) CAPS Take 2 capsules (2,000 Units total) by mouth daily.   clopidogrel (PLAVIX) 75 MG tablet TAKE ONE TABLET BY MOUTH EVERY EVENING   diclofenac Sodium (VOLTAREN) 1 % GEL Apply 2 g topically 3 (three) times daily.   Fluticasone-Umeclidin-Vilant  (TRELEGY ELLIPTA) 200-62.5-25 MCG/ACT AEPB Inhale 1 puff into the lungs daily.   furosemide (LASIX) 20 MG tablet Take 20 mg by mouth daily.   Glucagon, rDNA, (GLUCAGON EMERGENCY) 1 MG KIT INJECT into THE muscle ONCE AS NEEDED FOR emergency   insulin degludec (TRESIBA FLEXTOUCH) 100 UNIT/ML FlexTouch Pen Inject 60 Units into the skin at bedtime.   isosorbide mononitrate (IMDUR) 30 MG 24 hr tablet Take 0.5 tablets (15 mg total) by mouth daily.   losartan (COZAAR) 25 MG tablet Take 1 tablet (25 mg total) by mouth daily.   metoprolol succinate (TOPROL XL) 25 MG 24 hr tablet Take 1 tablet (25 mg total) by mouth daily.   Multiple Vitamin (MULTIVITAMIN ADULT) TABS Take 1 tablet by mouth in the morning and at bedtime.   nitroGLYCERIN (NITROSTAT) 0.4 MG SL tablet Place 1 tablet (0.4 mg total) under the tongue every 5 (five) minutes as needed for chest pain.   rosuvastatin (CRESTOR) 20 MG tablet Take 1 tablet (20 mg total) by mouth every evening.   spironolactone (ALDACTONE) 25 MG tablet Take 0.5 tablets (12.5 mg total) by mouth daily.   SYNTHROID 75 MCG tablet TAKE ONE TABLET BY MOUTH Monday-Saturday BEFORE breakfast   topiramate (TOPAMAX) 50 MG tablet Take 1 tablet (50 mg total) by mouth 2 (two) times daily.   torsemide (DEMADEX) 20 MG tablet Take 1 tablet (20 mg total) by mouth daily.   Immunization History  Administered Date(s) Administered   Pneumococcal Conjugate-13 09/23/2014   Pneumococcal Polysaccharide-23 01/05/2010, 07/17/2013   Tdap 10/07/2011       Objective:   Physical Exam BP 118/78 (BP Location: Left Arm, Cuff Size: Normal)   Pulse 76   Temp 97.7 F (36.5 C)   Ht 5\' 3"  (1.6 m)   Wt 157 lb (71.2 kg)   SpO2 98%   BMI 27.81 kg/m   SpO2: 98 % O2 Device: None (Room air)  GENERAL: Well-developed, well-nourished woman, no acute distress.  Fully ambulatory with no conversational dyspnea.  Nasal quality to speech.  No dysphonia noted today. HEAD: Normocephalic, atraumatic.  EYES:  Pupils equal, round, reactive to light.  No scleral icterus.  MOUTH: Oral mucosa moist.  No thrush. NECK: Supple. No thyromegaly. Trachea midline. No JVD.  No adenopathy. PULMONARY: Good air entry bilaterally.  No adventitious sounds. CARDIOVASCULAR: S1 and S2. Regular rate and rhythm.  No rubs, murmurs or gallops heard. ABDOMEN: Protuberant, otherwise benign. MUSCULOSKELETAL: No joint deformity, no clubbing, 1+ edema of lower extremities.  NEUROLOGIC: No overt focal deficit, no gait disturbance, speech is fluent. SKIN: Intact,warm,dry.  No overt rashes, exam limited. PSYCH: Behavior normal  Lab Results  Component Value Date   NITRICOXIDE 26 01/26/2023      Assessment & Plan:     ICD-10-CM   1. Mild persistent asthma  without complication  J45.30    Resume Trelegy 100, 1 puff daily Albuterol as needed    2. Shortness of breath  R06.02 Nitric oxide   Out of proportion to her very mild PFT impairment Suspect deconditioning may play a part    3. Coronary artery vasospasm (HCC)  I20.1    Noted on recent cath Management per cardiology    4. OSA (obstructive sleep apnea)  G47.33    Very mild Declines CPAP/oxygen     Meds ordered this encounter  Medications   Fluticasone-Umeclidin-Vilant (TRELEGY ELLIPTA) 100-62.5-25 MCG/ACT AEPB    Sig: Inhale 1 puff into the lungs daily.    Dispense:  28 each    Refill:  11   Fluticasone-Umeclidin-Vilant (TRELEGY ELLIPTA) 100-62.5-25 MCG/ACT AEPB    Sig: Inhale 1 puff into the lungs daily.    Dispense:  14 each    Refill:  0    Order Specific Question:   Lot Number?    Answer:   [id number]    Order Specific Question:   Expiration Date?    Answer:   04/26/2024    Order Specific Question:   Quantity    Answer:   1   Will see the patient in follow-up in 3 months time call sooner should any new problems arise.  Gailen Shelter, MD Advanced Bronchoscopy PCCM [facility]    *This note was dictated using voice recognition  software/Dragon.  Despite best efforts to proofread, errors can occur which can change the meaning. Any transcriptional errors that result from this process are unintentional and may not be fully corrected at the time of dictation.

## 2023-01-31 ENCOUNTER — Telehealth: Payer: Self-pay | Admitting: Family Medicine

## 2023-01-31 NOTE — Telephone Encounter (Signed)
Pt called in requesting a call from Dr Reece Agar stated she has to cancel her appointment due to a death in her family need  to discuss her issues with her breathing and weakness # [phone]

## 2023-01-31 NOTE — Telephone Encounter (Signed)
Rtn pt's call. States she is having trouble breathing and feeling weak. Seen by Dr. Jayme Cloud 01/27/23 for f/u and current sxs. Pt was told to continue current breathing tx. She wants to make Dr. Reece Agar aware and that she may have to cancel OV on 02/03/23 if the funeral is that day or Saturday.

## 2023-02-03 ENCOUNTER — Encounter: Payer: Self-pay | Admitting: Family Medicine

## 2023-02-03 ENCOUNTER — Ambulatory Visit (INDEPENDENT_AMBULATORY_CARE_PROVIDER_SITE_OTHER): Payer: PPO | Admitting: Family Medicine

## 2023-02-03 ENCOUNTER — Telehealth: Payer: Self-pay | Admitting: Pulmonary Disease

## 2023-02-03 ENCOUNTER — Other Ambulatory Visit: Payer: Self-pay | Admitting: Family Medicine

## 2023-02-03 VITALS — BP 140/66 | HR 82 | Temp 97.9°F | Ht 63.0 in | Wt 154.4 lb

## 2023-02-03 DIAGNOSIS — H543 Unqualified visual loss, both eyes: Secondary | ICD-10-CM | POA: Diagnosis not present

## 2023-02-03 DIAGNOSIS — E13319 Other specified diabetes mellitus with unspecified diabetic retinopathy without macular edema: Secondary | ICD-10-CM

## 2023-02-03 DIAGNOSIS — I6522 Occlusion and stenosis of left carotid artery: Secondary | ICD-10-CM

## 2023-02-03 DIAGNOSIS — I201 Angina pectoris with documented spasm: Secondary | ICD-10-CM | POA: Diagnosis not present

## 2023-02-03 LAB — POCT GLYCOSYLATED HEMOGLOBIN (HGB A1C): Hemoglobin A1C: 8.9 % — AB (ref 4.0–5.6)

## 2023-02-03 MED ORDER — CLOPIDOGREL BISULFATE 75 MG PO TABS: 75.0000 mg | ORAL_TABLET | Freq: Every evening | ORAL | 0 refills | Status: DC

## 2023-02-03 NOTE — Telephone Encounter (Signed)
I spoke with the patient and notified her that our office has not reached out to her.   Nothing further needed.

## 2023-02-03 NOTE — Progress Notes (Unsigned)
Ph: [phone] Fax: [phone]   Patient ID: Cassandra Benson, female    DOB: [birth year], 82 y.o.   MRN: [id number]  This visit was conducted in person.  BP (!) 140/66   Pulse 82   Temp 97.9 F (36.6 C) (Temporal)   Ht 5\' 3"  (1.6 m)   Wt 154 lb 6 oz (70 kg)   SpO2 98%   BMI 27.35 kg/m   Vision Screening   Right eye Left eye Both eyes  Without correction     With correction 20/25 20/25 20/25      CC: 3 mo f/u visit  Subjective:   HPI: Cassandra Benson is a 82 y.o. female presenting on 02/03/2023 for Medical Management of Chronic Issues (Here for 3 mo DM f/u.)   Stressful period due to death of first cousin involved in MVA.   Chronic exertional dyspnea in setting of mild asthma and mild OSA with mild impairment by PFTs - saw pulm Dr Jayme Cloud last week, restarted Trelegy 100 1 puff daily with PRN albuterol inhaler. Thought deconditioning contributing to symptoms.   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   DM - established with Dr Beryle Flock [facility] endocrinology. Note reviewed. Continued tresiba 60u nightly, added humalog 5u with meals. Considering GLP-1RA vs SGLT2i. H/o allergy to insulin aspart (headache). Planning 1 mo f/u visit upcoming later this month. Planned labwork - C peptide and diabetes antibodies (GAD, islet cell, insulin).   Notes blurry vision as well as periods where vision goes "black", sometimes one eye, sometimes two. This happens about once a week. Ongoing for the past month. No other associated symptoms such as headache, dizziness, palpitations, or memory loss. Upcoming appt with Dr Brooke Dare 02/24/2023.   Notes difficultly refilling plavix - has been out for the past 3 weeks - this correlates to the start of above vision changes.      Relevant past medical, surgical, family and social history reviewed and updated as indicated. Interim medical history since our last visit  reviewed. Allergies and medications reviewed and updated. Outpatient Medications Prior to Visit  Medication Sig Dispense Refill   acetaminophen (TYLENOL) 500 MG tablet Take 1,500 mg by mouth daily.     albuterol (VENTOLIN HFA) 108 (90 Base) MCG/ACT inhaler Inhale 2 puffs into the lungs every 6 (six) hours as needed. 8 g 2   Ascorbic Acid (VITAMIN C) 100 MG tablet Take 100 mg by mouth daily.     aspirin EC 81 MG tablet Take 1 tablet (81 mg total) by mouth daily. Swallow whole. 30 tablet 12   Baclofen 5 MG TABS Take 1-2 tablets (5-10 mg total) by mouth at bedtime as needed (neck pain). 30 tablet 3   Cholecalciferol (VITAMIN D3) 25 MCG (1000 UT) CAPS Take 2 capsules (2,000 Units total) by mouth daily.     diclofenac Sodium (VOLTAREN) 1 % GEL Apply 2 g topically 3 (three) times daily. 100 g 1   Fluticasone-Umeclidin-Vilant (TRELEGY ELLIPTA) 100-62.5-25 MCG/ACT AEPB Inhale 1 puff into the lungs daily. 28 each 11   Fluticasone-Umeclidin-Vilant (TRELEGY ELLIPTA) 100-62.5-25 MCG/ACT AEPB Inhale 1 puff into the lungs daily. 14 each 0   furosemide (LASIX) 20 MG tablet Take 20 mg by mouth daily.     Glucagon, rDNA, (GLUCAGON EMERGENCY) 1 MG KIT INJECT into THE muscle ONCE AS NEEDED FOR emergency 1 kit 12   insulin degludec (TRESIBA FLEXTOUCH) 100 UNIT/ML FlexTouch Pen Inject 60 Units into the  skin at bedtime. 54 mL 1   isosorbide mononitrate (IMDUR) 30 MG 24 hr tablet Take 0.5 tablets (15 mg total) by mouth daily. 30 tablet 6   losartan (COZAAR) 25 MG tablet Take 1 tablet (25 mg total) by mouth daily. 90 tablet 3   metoprolol succinate (TOPROL XL) 25 MG 24 hr tablet Take 1 tablet (25 mg total) by mouth daily. 30 tablet 5   Multiple Vitamin (MULTIVITAMIN ADULT) TABS Take 1 tablet by mouth in the morning and at bedtime.     nitroGLYCERIN (NITROSTAT) 0.4 MG SL tablet Place 1 tablet (0.4 mg total) under the tongue every 5 (five) minutes as needed for chest pain. 25 tablet PRN   rosuvastatin (CRESTOR) 20 MG  tablet Take 1 tablet (20 mg total) by mouth every evening. 90 tablet 4   spironolactone (ALDACTONE) 25 MG tablet Take 0.5 tablets (12.5 mg total) by mouth daily. 90 tablet 3   SYNTHROID 75 MCG tablet TAKE ONE TABLET BY MOUTH Monday-Saturday BEFORE breakfast 80 tablet 4   topiramate (TOPAMAX) 50 MG tablet Take 1 tablet (50 mg total) by mouth 2 (two) times daily. 60 tablet 12   torsemide (DEMADEX) 20 MG tablet Take 1 tablet (20 mg total) by mouth daily. 30 tablet 5   clopidogrel (PLAVIX) 75 MG tablet TAKE ONE TABLET BY MOUTH EVERY EVENING 30 tablet 6   No facility-administered medications prior to visit.     Per HPI unless specifically indicated in ROS section below Review of Systems  Objective:  BP (!) 140/66   Pulse 82   Temp 97.9 F (36.6 C) (Temporal)   Ht 5\' 3"  (1.6 m)   Wt 154 lb 6 oz (70 kg)   SpO2 98%   BMI 27.35 kg/m   Wt Readings from Last 3 Encounters:  02/03/23 154 lb 6 oz (70 kg)  01/26/23 157 lb (71.2 kg)  01/13/23 143 lb (64.9 kg)      Physical Exam Vitals and nursing note reviewed.  Constitutional:      Appearance: Normal appearance. She is not ill-appearing.  HENT:     Head: Normocephalic and atraumatic.     Mouth/Throat:     Mouth: Mucous membranes are moist.     Pharynx: Oropharynx is clear. No oropharyngeal exudate or posterior oropharyngeal erythema.  Eyes:     Extraocular Movements: Extraocular movements intact.     Pupils: Pupils are equal, round, and reactive to light.  Neck:     Vascular: No carotid bruit.  Cardiovascular:     Rate and Rhythm: Normal rate and regular rhythm.     Pulses: Normal pulses.     Heart sounds: Normal heart sounds. No murmur heard. Pulmonary:     Effort: Pulmonary effort is normal. No respiratory distress.     Breath sounds: Normal breath sounds. No wheezing, rhonchi or rales.  Musculoskeletal:     Cervical back: Normal range of motion and neck supple.     Right lower leg: No edema.     Left lower leg: No edema.   Skin:    General: Skin is warm and dry.     Findings: No rash.  Neurological:     General: No focal deficit present.     Mental Status: She is alert.     Cranial Nerves: Cranial nerves 2-12 are intact.     Sensory: Sensation is intact.     Motor: Motor function is intact.     Coordination: Coordination is intact.  Gait: Gait is intact.     Comments:  CN 2-12 intact FTN intact EOMI   Psychiatric:        Mood and Affect: Mood normal.        Behavior: Behavior normal.       Results for orders placed or performed in visit on 02/03/23  POCT glycosylated hemoglobin (Hb A1C)  Result Value Ref Range   Hemoglobin A1C 8.9 (A) 4.0 - 5.6 %   HbA1c POC (<> result, manual entry)     HbA1c, POC (prediabetic range)     HbA1c, POC (controlled diabetic range)     Lab Results  Component Value Date   TSH 2.49 10/28/2022   Assessment & Plan:   Problem List Items Addressed This Visit     Carotid stenosis, 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    Last carotid US reassuring 04/2022. With new vision changes, update carotid US. Will be due for [facility]VS appt 04/2023.       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  She continues tresiba, has now been started on mealtime correction with humalog 5u with meals, Cassandra Benson already notes improvement in sugar control, and A1c has dropped to 8.9% (from 11%).  She will keep upcoming endo appt.       Relevant Orders   POCT glycosylated hemoglobin (Hb A1C) (Completed)   Coronary artery vasospasm (HCC)    Doing better on imdur 15mg  daily.       Vision loss, bilateral    Notes concerning episodes of monocular and binocular vision loss lasting several seconds since she's run out of plavix concerning for amaurosis fugax. Continues aspirin 81mg  daily.  Will refill plavix for a month while she gets next  refill from vascular surgery, to let me know if any difficulty filling.  Will check brain MRI and update carotid US in h/o L CEA.       Relevant Orders   MR Brain Wo Contrast   VAS US CAROTID     Meds ordered this encounter  Medications   clopidogrel (PLAVIX) 75 MG tablet    Sig: Take 1 tablet (75 mg total) by mouth every evening.    Dispense:  30 tablet    Refill:  0    Orders Placed This Encounter  Procedures   MR Brain Wo Contrast    Standing Status:   Future    Standing Expiration Date:   02/03/2024    Order Specific Question:   What is the patient's sedation requirement?    Answer:   No Sedation    Order Specific Question:   Does the patient have a pacemaker or implanted devices?    Answer:   No    Order Specific Question:   Preferred imaging location?    Answer:   [facility] (table limit-350lbs)   POCT glycosylated hemoglobin (Hb A1C)    Patient Instructions  Call Dr Burna Forts' office for appointment this year and ask for clopidogrel refill.  I've gone ahead and sent in 1 month supply of clopidogrel.  For vision changes - schedule appointment with eye doctor. I will order brain MRI for further evaluation.  Vision screen today.  Good to see you today. I'm glad you're seeing the endocrinologist.   Follow up plan: Return in about 4 months (around 06/06/2023), or if symptoms worsen or fail  to improve, for follow up visit.  [name], MD

## 2023-02-03 NOTE — Patient Instructions (Addendum)
Call Dr Burna Forts' office for appointment this year and ask for clopidogrel refill.  I've gone ahead and sent in 1 month supply of clopidogrel.  For vision changes - schedule appointment with eye doctor. I will order brain MRI for further evaluation.  Vision screen today.  Good to see you today. I'm glad you're seeing the endocrinologist.

## 2023-02-04 NOTE — Assessment & Plan Note (Signed)
Appreciate endocrinology care, established with Dr Dell Ponto at [facility].  She continues tresiba, has now been started on mealtime correction with humalog 5u with meals, Sabeen already notes improvement in sugar control, and A1c has dropped to 8.9% (from 11%).  She will keep upcoming endo appt.

## 2023-02-06 ENCOUNTER — Ambulatory Visit: Payer: HMO | Admitting: Family

## 2023-02-06 ENCOUNTER — Encounter: Payer: Self-pay | Admitting: *Deleted

## 2023-02-06 NOTE — Assessment & Plan Note (Addendum)
Last carotid US reassuring 04/2022. With new vision changes, update carotid US. Will be due for VVS appt 04/2023.

## 2023-02-06 NOTE — Assessment & Plan Note (Signed)
Doing better on imdur 15mg  daily.

## 2023-02-06 NOTE — Assessment & Plan Note (Signed)
Notes concerning episodes of monocular and binocular vision loss lasting several seconds since she's run out of plavix concerning for amaurosis fugax. Continues aspirin 81mg  daily.  Will refill plavix for a month while she gets next refill from vascular surgery, to let me know if any difficulty filling.  Will check brain MRI and update carotid US in h/o L CEA.

## 2023-02-13 ENCOUNTER — Other Ambulatory Visit: Payer: Self-pay

## 2023-02-13 ENCOUNTER — Other Ambulatory Visit: Payer: Self-pay | Admitting: Family

## 2023-02-13 DIAGNOSIS — I5032 Chronic diastolic (congestive) heart failure: Secondary | ICD-10-CM

## 2023-02-13 MED ORDER — CLOPIDOGREL BISULFATE 75 MG PO TABS: 75.0000 mg | ORAL_TABLET | Freq: Every evening | ORAL | 2 refills | Status: DC

## 2023-02-15 ENCOUNTER — Other Ambulatory Visit (HOSPITAL_COMMUNITY): Payer: Self-pay

## 2023-02-15 ENCOUNTER — Encounter: Payer: Self-pay | Admitting: Family

## 2023-02-15 ENCOUNTER — Telehealth (HOSPITAL_COMMUNITY): Payer: Self-pay

## 2023-02-15 ENCOUNTER — Ambulatory Visit (HOSPITAL_BASED_OUTPATIENT_CLINIC_OR_DEPARTMENT_OTHER): Payer: PPO | Admitting: Family

## 2023-02-15 VITALS — BP 119/68 | HR 92 | Wt 156.0 lb

## 2023-02-15 DIAGNOSIS — E139 Other specified diabetes mellitus without complications: Secondary | ICD-10-CM

## 2023-02-15 DIAGNOSIS — I5032 Chronic diastolic (congestive) heart failure: Secondary | ICD-10-CM | POA: Diagnosis not present

## 2023-02-15 DIAGNOSIS — H543 Unqualified visual loss, both eyes: Secondary | ICD-10-CM | POA: Diagnosis not present

## 2023-02-15 DIAGNOSIS — I10 Essential (primary) hypertension: Secondary | ICD-10-CM | POA: Diagnosis not present

## 2023-02-15 DIAGNOSIS — G4733 Obstructive sleep apnea (adult) (pediatric): Secondary | ICD-10-CM | POA: Diagnosis not present

## 2023-02-15 NOTE — Progress Notes (Signed)
[facility] FAIL[facility] - Pharmacist Note  Cassandra Benson is a 82 y.o. 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 She reports that the cost of her medications is a Nutritional therapist. Per patient advocate, HF medications are $0 copay with current insurance. Trellegy is likely the culprit for financial burden. She reports taking metoprolol as needed and reports adherence to other HF medications. She reports taking her weight daily and that it has been stable around 150 lbs. She reports no leg swelling today and no worsening shortness of breath. He reports adherence to fluid restriction.   Recent ED Visit (past 6 months):  Date: 01/13/2023, CC: SOB Date: 11/16/2022, CC: weakness and chest pain  Guideline-Directed Medical Therapy/Evidence Based Medicine ACE/ARB/ARNI: Losartan 25 mg daily Beta Blocker: Metoprolol succinate 25 mg daily as needed Aldosterone Antagonist: Spironolactone 12.5 mg daily Diuretic: Furosemide 20 mg daily SGLT2i:  not a candidate due to LADA  Adherence Assessment Do you ever forget to take your medication? [] Yes [x] No  Do you ever skip doses due to side effects? [] Yes [x] No  Do you have trouble affording your medicines? [] Yes [x] No  Are you ever unable to pick up your medication due to transportation difficulties? [] Yes [x] No  Do you ever stop taking your medications because you don't believe they are helping? [] Yes [x] No  Do you check your weight daily? [x] Yes [] No  Adherence strategy: none reported Barriers to obtaining medications: cost of all Trellegy, no specific complaints  Diagnostics ECHO: Date 12/23/2022, EF 60-65%, no RWMA, G1DD Cath: Date 12/02/2022, no CAD, normal CI/CO, normal filling pressures  Vitals    02/15/2023    9:23 AM 02/03/2023    8:18 AM 02/03/2023    8:11 AM  Vitals with BMI  Height   5\' 3"   Weight 156 lbs  154 lbs 6 oz  BMI 27.64  27.35  Systolic 119 140 409  Diastolic 68 66 70  Pulse  92  82     Recent Labs    Latest Ref Rng & Units 01/13/2023   11:53 AM 12/02/2022   12:29 PM 12/02/2022   12:26 PM  BMP  Glucose 70 - 99 mg/dL 811     BUN 8 - 23 mg/dL 12     Creatinine 9.14 - 1.00 mg/dL 7.82     Sodium 956 - 213 mmol/L 143  142  143   Potassium 3.5 - 5.1 mmol/L 3.3  3.6  3.6   Chloride 98 - 111 mmol/L 112     CO2 22 - 32 mmol/L 22     Calcium 8.9 - 10.3 mg/dL 9.1       Past Medical History Past Medical History:  Diagnosis Date   [facility]2021   [facility]   Allergy    ANA positive    positive ANA pattern 1 speckled   Arthritis    Carotid stenosis, asymptomatic 06/19/2015   1-39% RICA 40-59% LICA rpt 1 yr (05/2015)    CHF (congestive heart failure) (HCC)    Colon polyps    COVID-19 virus infection 09/14/2021   Dermatomyositis (HCC)    Diabetes mellitus without complication (HCC)    Type 1   Diverticulosis    sigmoid on CT scan 12/2019   Family history of adverse reaction to anesthesia    brothr went into cardiac arrest from anectine   Fibromyalgia    prior PCP   GERD (gastroesophageal reflux disease)    prior  PCP   Glaucoma    Narrow angle   History of blood clots    DVT, in 20s, none since   History of chicken pox    History of diverticulitis    History of pericarditis 1986   with hospitalization   History of pneumonia 2014   History of shingles    History of UTI    Hyperlipidemia    Hypertension    Hypothyroidism    Mixed connective tissue disease (HCC)    Partial small bowel obstruction (HCC) 12/2019   managed conservatively   Peptic ulcer    Pneumonia    PONV (postoperative nausea and vomiting)    Raynaud's disease without gangrene    Shoulder pain left   h/o RTC tendonitis and adhesive capsulitis   Sigmoid diverticulitis 05/26/2021   Sjogren's syndrome (HCC)    Sleep apnea    prior PCP - no CPAP for about 10 yrs   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HCC)    Vitamin D deficiency    prior PCP    Plan Continue regimen  as directed by NP Consider titration of GDMT as BP and renal function allow Annual echo due 11/2023  Time spent: 15 minutes  Celene Squibb, PharmD PGY1 Pharmacy Resident 02/15/2023 10:58 AM

## 2023-02-15 NOTE — Telephone Encounter (Signed)
Patient Advocate Encounter  Patient inquired about medication costs during appointment. I have processed test claims for this medication list and received the following results:  $0 copay listed for the following: Albuterol HFA. Clopidogrel*, Diclofenac gel, Furosemide*, Tresiba, Humalog, Isosorbide*, Losartan*, Metoprolol*, Nitrostat, Rosuvastatin*, Spironolactone*, Synthroid, Topiramate*, Torsemide  (* indicates a medication on [facility]'s $5 or $10 monthly list)  Other medications returned pricing of $15.99 per month for Baclofen, $47 per month for Trelegy.  There are currently no programs that would reduce the copay for heart failure medications to the patient, as she is not currently being charged for these meds.  I have informed the patient by phone, and I have sent an email to her including the above information, along with a [employer] assistance application that would help cover the cost of Trelegy, if needed.  Patient will call or email with any future concerns.  Burnell Blanks, CPhT Rx Patient Advocate Phone: [phone]

## 2023-02-15 NOTE — Progress Notes (Signed)
PCP: Eustaquio Boyden, MD (last seen 05/24) Primary Cardiologist: Julien Nordmann, MD (last seen 06/23) HF provider: Dorthula Nettles, MD (last seen 03/24)  HPI:  Cassandra Benson is a 82 yo female with a PMHx significant for HTN, HLD, hypothyroidism, CAD, LADA, HA's, fibromyalgia, PAD, vasospasm, Sjogren's syndrome, and CHF.   Echo 12/23/22: EF 60-65% with Grade I DD. Echo 12/24/21: EF of 60-65% along with mild MR. Echo 07/30/20: EF of 55-60% with grade II diastolic dysfunction.   RHC 12/02/22: No critical coronary artery disease.  The is up to ~50% stenosis at the ostium of the RCA with pressure dampening noted using 19F diagnostic catheter.  Slight improvement noted with intracoronary nitroglycerin suggesting at least some component of vasospasm.  No angiographically significant coronary artery disease noted in the left coronary artery. Normal left and right heart filling pressures. Normal Fick cardiac output/index. Small right radial artery with significant vasospasm throughout the procedure.  Recommend alternate access for future catheterizations. Recommendations: Add isosorbide mononitrate 15 mg daily and as needed sublingual nitroglycerin for antianginal therapy, including possible coronary vasospasm.  Was in the ED 01/13/23 due to SOB/ pedal edema. Elevated D-dimer. VQ scan negative for PE. Was in the ED 11/16/22 due to SOB due to HF exacerbation.   She presents today for a HF f/u visit with a chief complaint of moderate SOB w/ minimal exertion. Chronic in nature. Has associated fatigue, occasional chest pain on right upper chest, minimal pedal edema, palpitations and dizziness especially when bending over at the waist. Has continued on the furosemide as that seems to be working for right now. Has worn compression socks in the past but has to get new ones.   Doing some walking but gets quite SOB. Sister and both parents died of heart conditions. Multiple family members who have also had  aneurysms. Reports that she has to stop and rest often when doing activities due to her SOB/fatigue and sometimes has trouble adapting to her "new normal".   ROS: All systems negative except as listed in HPI, PMH and Problem List.  SH:  Social History   Socioeconomic History   Marital status: Widowed    Spouse name: Not on file   Number of children: 2   Years of education: Not on file   Highest education level: Not on file  Occupational History   Occupation: retired  Tobacco Use   Smoking status: Never   Smokeless tobacco: Never  Vaping Use   Vaping Use: Never used  Substance and Sexual Activity   Alcohol use: No   Drug use: No   Sexual activity: Not on file  Other Topics Concern   Not on file  Social History Narrative   Lives in [place], moved from [place].    Widow - husband decreased 01/2016 of metastatic colon CA   No pets.   Son Rejeanne Madey lives nearby. Daughter lives in [place]. Sister lives 2 blocks away.    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, [employer]   Exercise - limited   Diet - good water, fruits/vegetables daily, limited meat, protein drink every morning   Social Determinants of Health   Financial Resource Strain: Low Risk  (09/27/2022)   Overall Financial Resource Strain (CARDIA)    Difficulty of Paying Living Expenses: Not hard at all  Food Insecurity: No Food Insecurity (09/27/2022)   Hunger Vital Sign    Worried About Running  Out of Food in the Last Year: Never true    Ran Out of Food in the Last Year: Never true  Transportation Needs: No Transportation Needs (09/27/2022)   PRAPARE - Administrator, Civil Service (Medical): No    Lack of Transportation (Non-Medical): No  Physical Activity: Insufficiently Active (09/27/2022)   Exercise Vital Sign    Days of Exercise per Week: 3 days    Minutes of Exercise per Session: 30 min  Stress: No Stress Concern  Present (09/27/2022)   [employer] of Occupational Health - Occupational Stress Questionnaire    Feeling of Stress : Not at all  Social Connections: Moderately Integrated (09/27/2022)   Social Connection and Isolation Panel [NHANES]    Frequency of Communication with Friends and Family: More than three times a week    Frequency of Social Gatherings with Friends and Family: More than three times a week    Attends Religious Services: More than 4 times per year    Active Member of [employer] or Organizations: Yes    Attends Banker Meetings: More than 4 times per year    Marital Status: Widowed  Intimate Partner Violence: Not At Risk (09/27/2022)   Humiliation, Afraid, Rape, and Kick questionnaire    Fear of Current or Ex-Partner: No    Emotionally Abused: No    Physically Abused: No    Sexually Abused: No    FH:  Family History  Problem Relation Age of Onset   CAD Mother 40       MI, aortic valve issues   COPD Mother    Lupus Mother    Luiz Blare' disease Mother    Rheum arthritis Mother    CAD Father 23       CABG x2, aortic valve replacement   Stroke Sister    CAD Sister    Anuerysm Sister        brain   Lupus Sister    Diabetes Sister    Diabetes Sister    Breast cancer Sister    Alcohol abuse Brother    CAD Brother 67       MI   Stroke Brother    COPD Brother        agent orange   CAD Brother 60       stent   Diabetes Brother    Stroke Maternal Grandmother    Hypertension Maternal Grandmother    Gallbladder disease Maternal Grandmother    Breast cancer Maternal Aunt    Breast cancer Maternal Aunt    Depression Grandchild    Colon cancer Neg Hx    Esophageal cancer Neg Hx    Rectal cancer Neg Hx    Stomach cancer Neg Hx     Past Medical History:  Diagnosis Date   Acute diverticulitis 04/2021   [facility] ER, CT confirmed   Allergy    ANA positive    positive ANA pattern 1 speckled   Arthritis    Carotid stenosis, asymptomatic 06/19/2015   1-39% RICA  40-59% LICA rpt 1 yr (05/2015)    CHF (congestive heart failure) (HCC)    Colon polyps    COVID-19 virus infection 09/14/2021   Dermatomyositis (HCC)    Diabetes mellitus without complication (HCC)    Type 1   Diverticulosis    sigmoid on CT scan 12/2019   Family history of adverse reaction to anesthesia    brothr went into cardiac arrest from anectine   Fibromyalgia  prior PCP   GERD (gastroesophageal reflux disease)    prior PCP   Glaucoma    Narrow angle   History of blood clots    DVT, in 20s, none since   History of chicken pox    History of diverticulitis    History of pericarditis 1986   with hospitalization   History of pneumonia 2014   History of shingles    History of UTI    Hyperlipidemia    Hypertension    Hypothyroidism    Mixed connective tissue disease (HCC)    Partial small bowel obstruction (HCC) 12/2019   managed conservatively   Peptic ulcer    Pneumonia    PONV (postoperative nausea and vomiting)    Raynaud's disease without gangrene    Shoulder pain left   h/o RTC tendonitis and adhesive capsulitis   Sigmoid diverticulitis 05/26/2021   Sjogren's syndrome (HCC)    Sleep apnea    prior PCP - no CPAP for about 10 yrs   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HCC)    Vitamin D deficiency    prior PCP    Current Outpatient Medications  Medication Sig Dispense Refill   acetaminophen (TYLENOL) 500 MG tablet Take 1,500 mg by mouth daily.     albuterol (VENTOLIN HFA) 108 (90 Base) MCG/ACT inhaler Inhale 2 puffs into the lungs every 6 (six) hours as needed. 8 g 2   Ascorbic Acid (VITAMIN C) 100 MG tablet Take 100 mg by mouth daily.     aspirin EC 81 MG tablet Take 1 tablet (81 mg total) by mouth daily. Swallow whole. 30 tablet 12   Baclofen 5 MG TABS Take 1-2 tablets (5-10 mg total) by mouth at bedtime as needed (neck pain). 30 tablet 3   Cholecalciferol (VITAMIN D3) 25 MCG (1000 UT) CAPS Take 2 capsules (2,000 Units total) by mouth daily.     clopidogrel (PLAVIX) 75 MG  tablet Take 1 tablet (75 mg total) by mouth every evening. 30 tablet 2   diclofenac Sodium (VOLTAREN) 1 % GEL Apply 2 g topically 3 (three) times daily. 100 g 1   Fluticasone-Umeclidin-Vilant (TRELEGY ELLIPTA) 100-62.5-25 MCG/ACT AEPB Inhale 1 puff into the lungs daily. 28 each 11   Fluticasone-Umeclidin-Vilant (TRELEGY ELLIPTA) 100-62.5-25 MCG/ACT AEPB Inhale 1 puff into the lungs daily. 14 each 0   furosemide (LASIX) 20 MG tablet Take 20 mg by mouth daily.     Glucagon, rDNA, (GLUCAGON EMERGENCY) 1 MG KIT INJECT into THE muscle ONCE AS NEEDED FOR emergency 1 kit 12   insulin degludec (TRESIBA FLEXTOUCH) 100 UNIT/ML FlexTouch Pen Inject 60 Units into the skin at bedtime. 54 mL 1   isosorbide mononitrate (IMDUR) 30 MG 24 hr tablet Take 0.5 tablets (15 mg total) by mouth daily. 30 tablet 6   losartan (COZAAR) 25 MG tablet Take 1 tablet (25 mg total) by mouth daily. 90 tablet 3   metoprolol succinate (TOPROL-XL) 25 MG 24 hr tablet TAKE 1 TABLET(25 MG) BY MOUTH DAILY 30 tablet 5   Multiple Vitamin (MULTIVITAMIN ADULT) TABS Take 1 tablet by mouth in the morning and at bedtime.     nitroGLYCERIN (NITROSTAT) 0.4 MG SL tablet Place 1 tablet (0.4 mg total) under the tongue every 5 (five) minutes as needed for chest pain. 25 tablet PRN   rosuvastatin (CRESTOR) 20 MG tablet Take 1 tablet (20 mg total) by mouth every evening. 90 tablet 4   spironolactone (ALDACTONE) 25 MG tablet Take 0.5 tablets (12.5 mg total) by mouth daily.  90 tablet 3   SYNTHROID 75 MCG tablet TAKE ONE TABLET BY MOUTH Monday-Saturday BEFORE breakfast 80 tablet 4   topiramate (TOPAMAX) 50 MG tablet Take 1 tablet (50 mg total) by mouth 2 (two) times daily. 60 tablet 12   torsemide (DEMADEX) 20 MG tablet Take 1 tablet (20 mg total) by mouth daily. 30 tablet 5   No current facility-administered medications for this visit.   Vitals:   02/15/23 0923  BP: 119/68  Pulse: 92  SpO2: 100%  Weight: 156 lb (70.8 kg)   Wt Readings from Last 3  Encounters:  02/15/23 156 lb (70.8 kg)  02/03/23 154 lb 6 oz (70 kg)  01/26/23 157 lb (71.2 kg)   Lab Results  Component Value Date   CREATININE 0.93 01/13/2023   CREATININE 0.91 11/25/2022   CREATININE 0.93 11/16/2022   PHYSICAL EXAM:  General:  Well appearing. No resp difficulty HEENT: normal Neck: supple. JVP flat. No lymphadenopathy or thryomegaly appreciated. Cor: PMI normal. Regular rate & rhythm. No rubs, gallops or murmurs. Lungs: clear Abdomen: soft, nontender, nondistended. No hepatosplenomegaly. No bruits or masses. Extremities: no cyanosis, clubbing, rash, trace pitting edema bilateral lower legs Neuro: alert & orientedx3, cranial nerves grossly intact. Moves all 4 extremities w/o difficulty. Affect pleasant.   ECG: not done   ASSESSMENT & PLAN:  NICM with preserved EF- - likely d/t HTN as cath showed no critical CAD - NYHA III - euvolemic - weighing daily; reminded to call for an overnight weight gain of >2 pounds or a weekly weight gain of >5 pounds - weight up 4 pounds from last visit here 2.5 months ago - Echo 12/23/22: EF 60-65% with Grade I DD.  - Echo 12/24/21: EF of 60-65% along with mild MR.  - Echo 07/30/20: EF of 55-60% with grade II diastolic dysfunction.  - RHC 12/02/22: No critical coronary artery disease.  The is up to ~50% stenosis at the ostium of the RCA with pressure dampening noted using 60F diagnostic catheter.  Slight improvement noted with intracoronary nitroglycerin suggesting at least some component of vasospasm.  No angiographically significant coronary artery disease noted in the left coronary artery. Normal left and right heart filling pressures. Normal Fick cardiac output/index. Small right radial artery with significant vasospasm throughout the procedure.  Recommend alternate access for future catheterizations. - adhering to low sodium diet and fluid restriction - continue losartan 25mg  daily - continue metoprolol succinate 25mg  daily -  continue furosemide 20mg  daily - continue spironolactone 12.5mg  daily - valsartan caused hacking cough - can not add SGLT2 due to LADA - saw HF provider Gasper Lloyd) 03/24 - saw cardiology Mariah Milling) 06/23 - encouraged to get compression socks and put them on every morning with removal at betime - BNP 11/16/22 was 59.0 - PharmD reconciled meds w/ patient  2. HTN - BP 119/68 - saw PCP Sharen Hones) 05/24 - BMP 01/13/23 reviewed: sodium 143, potassium 3.3, creatinine 0.93 & GFR >60   3. LADA - saw endocrinology Dell Ponto) 04/24 - A1c 02/03/23 was 8.9%  4: Mild obstructive sleep apnea - saw pulmonology Jayme Cloud) 05/24 - home sleep study done 09/22/22 which showed mild obstructive sleep apnea with AHI of 8.5 and oxygen saturations as low as 82% nocturnally. Patient declined CPAP/oxygen.  - PFT's 04/22/22  Return in 3 months, sooner if needed. At next visit, discussed scheduling appt with HF provider.

## 2023-02-15 NOTE — Patient Instructions (Signed)
Get compression socks and put them on every morning with removal at bedtime 

## 2023-02-16 ENCOUNTER — Ambulatory Visit
Admission: RE | Admit: 2023-02-16 | Discharge: 2023-02-16 | Disposition: A | Discharge status: Home / Self Care | Payer: PPO | Source: Ambulatory Visit | Attending: Family Medicine | Admitting: Family Medicine

## 2023-02-16 DIAGNOSIS — H543 Unqualified visual loss, both eyes: Secondary | ICD-10-CM | POA: Insufficient documentation

## 2023-02-16 DIAGNOSIS — I10 Essential (primary) hypertension: Secondary | ICD-10-CM | POA: Insufficient documentation

## 2023-02-16 DIAGNOSIS — G9389 Other specified disorders of brain: Secondary | ICD-10-CM | POA: Diagnosis not present

## 2023-02-16 DIAGNOSIS — E139 Other specified diabetes mellitus without complications: Secondary | ICD-10-CM | POA: Insufficient documentation

## 2023-02-16 DIAGNOSIS — G4733 Obstructive sleep apnea (adult) (pediatric): Secondary | ICD-10-CM | POA: Insufficient documentation

## 2023-02-16 DIAGNOSIS — I5032 Chronic diastolic (congestive) heart failure: Secondary | ICD-10-CM | POA: Insufficient documentation

## 2023-02-24 DIAGNOSIS — H353132 Nonexudative age-related macular degeneration, bilateral, intermediate dry stage: Secondary | ICD-10-CM | POA: Diagnosis not present

## 2023-03-01 ENCOUNTER — Encounter: Payer: Self-pay | Admitting: Family Medicine

## 2023-03-10 ENCOUNTER — Ambulatory Visit
Admission: RE | Admit: 2023-03-10 | Discharge: 2023-03-10 | Disposition: A | Discharge status: Home / Self Care | Payer: PPO | Source: Ambulatory Visit | Attending: Family Medicine | Admitting: Family Medicine

## 2023-03-10 DIAGNOSIS — Z1231 Encounter for screening mammogram for malignant neoplasm of breast: Secondary | ICD-10-CM | POA: Diagnosis not present

## 2023-03-14 ENCOUNTER — Other Ambulatory Visit: Payer: Self-pay

## 2023-03-14 ENCOUNTER — Emergency Department: Payer: PPO

## 2023-03-14 ENCOUNTER — Telehealth: Payer: Self-pay | Admitting: Family Medicine

## 2023-03-14 ENCOUNTER — Emergency Department
Admission: EM | Admit: 2023-03-14 | Discharge: 2023-03-14 | Disposition: A | Discharge status: Home / Self Care | Payer: PPO | Attending: Emergency Medicine | Admitting: Emergency Medicine

## 2023-03-14 DIAGNOSIS — R0902 Hypoxemia: Secondary | ICD-10-CM | POA: Diagnosis not present

## 2023-03-14 DIAGNOSIS — E039 Hypothyroidism, unspecified: Secondary | ICD-10-CM | POA: Insufficient documentation

## 2023-03-14 DIAGNOSIS — I5030 Unspecified diastolic (congestive) heart failure: Secondary | ICD-10-CM | POA: Diagnosis not present

## 2023-03-14 DIAGNOSIS — R079 Chest pain, unspecified: Secondary | ICD-10-CM | POA: Diagnosis not present

## 2023-03-14 DIAGNOSIS — I110 Hypertensive heart disease with heart failure: Secondary | ICD-10-CM | POA: Insufficient documentation

## 2023-03-14 DIAGNOSIS — I2510 Atherosclerotic heart disease of native coronary artery without angina pectoris: Secondary | ICD-10-CM | POA: Insufficient documentation

## 2023-03-14 DIAGNOSIS — R0789 Other chest pain: Secondary | ICD-10-CM | POA: Insufficient documentation

## 2023-03-14 DIAGNOSIS — E119 Type 2 diabetes mellitus without complications: Secondary | ICD-10-CM | POA: Insufficient documentation

## 2023-03-14 LAB — CBC WITH DIFFERENTIAL/PLATELET
Abs Immature Granulocytes: 0.03 10*3/uL (ref 0.00–0.07)
Basophils Absolute: 0.1 10*3/uL (ref 0.0–0.1)
Basophils Relative: 1 %
Eosinophils Absolute: 0.1 10*3/uL (ref 0.0–0.5)
Eosinophils Relative: 2 %
HCT: 42.8 % (ref 36.0–46.0)
Hemoglobin: 13.7 g/dL (ref 12.0–15.0)
Immature Granulocytes: 0 %
Lymphocytes Relative: 32 %
Lymphs Abs: 2.8 10*3/uL (ref 0.7–4.0)
MCH: 27.1 pg (ref 26.0–34.0)
MCHC: 32 g/dL (ref 30.0–36.0)
MCV: 84.8 fL (ref 80.0–100.0)
Monocytes Absolute: 0.6 10*3/uL (ref 0.1–1.0)
Monocytes Relative: 7 %
Neutro Abs: 4.9 10*3/uL (ref 1.7–7.7)
Neutrophils Relative %: 58 %
Platelets: 212 10*3/uL (ref 150–400)
RBC: 5.05 MIL/uL (ref 3.87–5.11)
RDW: 14 % (ref 11.5–15.5)
WBC: 8.5 10*3/uL (ref 4.0–10.5)
nRBC: 0 % (ref 0.0–0.2)

## 2023-03-14 LAB — TROPONIN I (HIGH SENSITIVITY)
Troponin I (High Sensitivity): 4 ng/L (ref <18)
Troponin I (High Sensitivity): 4 ng/L (ref <18)

## 2023-03-14 LAB — BASIC METABOLIC PANEL
Anion gap: 9 (ref 5–15)
BUN: 16 mg/dL (ref 8–23)
CO2: 22 mmol/L (ref 22–32)
Calcium: 9.3 mg/dL (ref 8.9–10.3)
Chloride: 106 mmol/L (ref 98–111)
Creatinine, Ser: 0.83 mg/dL (ref 0.44–1.00)
GFR, Estimated: >60 mL/min (ref >60)
Glucose, Bld: 229 mg/dL — ABNORMAL HIGH (ref 70–99)
Potassium: 3.9 mmol/L (ref 3.5–5.1)
Sodium: 137 mmol/L (ref 135–145)

## 2023-03-14 MED ORDER — NITROGLYCERIN 0.4 MG SL SUBL
0.4000 mg | SUBLINGUAL_TABLET | SUBLINGUAL | Status: AC | PRN
  Administered 2023-03-14 (×3): 0.4 mg via SUBLINGUAL
  Filled 2023-03-14: qty 1

## 2023-03-14 MED ORDER — SODIUM CHLORIDE 0.9 % IV BOLUS
500.0000 mL | Freq: Once | INTRAVENOUS | Status: AC
  Administered 2023-03-14: 500 mL via INTRAVENOUS

## 2023-03-14 NOTE — Telephone Encounter (Signed)
Noted seen at ER with reassuring evaluation, chest pain treated with SL nitroglycerin x3. CXR and EKG reassuring. Discharged with plan to f/u with cardiology - would have her reach out to cardiology to see if she needs sooner appt. Ensure she's been taking her isosorbide mononitrate 1/2 tab daily

## 2023-03-14 NOTE — ED Provider Notes (Signed)
[facility] Provider Note    Event Date/Time   First MD Initiated Contact with Patient 03/14/23 [phone]     (approximate)   History   Chief Complaint Chest Pain   HPI  Cassandra Benson is a 82 y.o. female with past medical history of hypertension, hyperlipidemia, CAD, PAD, diastolic CHF, hypothyroidism, and fibromyalgia who presents to the ED complaining of chest pain.  Patient reports that she woke from sleep around 6:00 this morning with pressure and heaviness in both sides of her chest.  Pressure has been present constantly since then, associated with some mild difficulty breathing.  She states she was feeling well when she went to bed last night, denies any recent fevers or cough.  She has not had any pain or swelling in her legs.  EMS was called and patient was given 4 baby aspirin prior to arrival, denies any change in her chest pain since then.     Physical Exam   Triage Vital Signs: ED Triage Vitals  Enc Vitals Group     BP 03/14/23 0916 (!) 122/58     Pulse Rate 03/14/23 0916 72     Resp 03/14/23 0916 19     Temp --      Temp src --      SpO2 03/14/23 0916 100 %     Weight 03/14/23 0917 169 lb 8.5 oz (76.9 kg)     Height 03/14/23 0916 5\' 3"  (1.6 m)     Head Circumference --      Peak Flow --      Pain Score --      Pain Loc --      Pain Edu? --      Excl. in GC? --     Most recent vital signs: Vitals:   03/14/23 1100 03/14/23 1232  BP: (!) 100/57 (!) 110/59  Pulse: 61   Resp: 15   Temp:    SpO2: 100%     Constitutional: Alert and oriented. Eyes: Conjunctivae are normal. Head: Atraumatic. Nose: No congestion/rhinnorhea. Mouth/Throat: Mucous membranes are moist.  Cardiovascular: Normal rate, regular rhythm. Grossly normal heart sounds.  2+ radial pulses bilaterally. Respiratory: Normal respiratory effort.  No retractions. Lungs CTAB. Gastrointestinal: Soft and nontender. No distention. Musculoskeletal: No lower extremity  tenderness nor edema.  Neurologic:  Normal speech and language. No gross focal neurologic deficits are appreciated.    ED Results / Procedures / Treatments   Labs (all labs ordered are listed, but only abnormal results are displayed) Labs Reviewed  BASIC METABOLIC PANEL - Abnormal; Notable for the following components:      Result Value   Glucose, Bld 229 (*)    All other components within normal limits  CBC WITH DIFFERENTIAL/PLATELET  TROPONIN I (HIGH SENSITIVITY)  TROPONIN I (HIGH SENSITIVITY)     EKG  ED ECG REPORT I, Chesley Noon, the attending physician, personally viewed and interpreted this ECG.   Date: 03/14/2023  EKG Time: 9:20  Rate: 70  Rhythm: normal sinus rhythm  Axis: Normal  Intervals:none  ST&T Change: None  RADIOLOGY Chest x-ray reviewed and interpreted by me with no infiltrate, edema, or effusion.  PROCEDURES:  Critical Care performed: No  Procedures   MEDICATIONS ORDERED IN ED: Medications  nitroGLYCERIN (NITROSTAT) SL tablet 0.4 mg (0.4 mg Sublingual Given 03/14/23 0955)  sodium chloride 0.9 % bolus 500 mL (0 mLs Intravenous Stopped 03/14/23 1236)     IMPRESSION / MDM / ASSESSMENT AND PLAN /  ED COURSE  I reviewed the triage vital signs and the nursing notes.                              82 y.o. female with past medical history of hypertension, hyperlipidemia, CAD, PAD, diastolic CHF, hypothyroidism, and fibromyalgia who presents to the ED with onset of chest heaviness and pressure with mild difficulty breathing after waking up around 6:00 this morning.  Patient's presentation is most consistent with acute presentation with potential threat to life or bodily function.  Differential diagnosis includes, but is not limited to, ACS, PE, dissection, pneumonia, pneumothorax, musculoskeletal pain, GERD, and anxiety.  Patient nontoxic-appearing and in no acute distress, vital signs are unremarkable.  EKG read by computer as complete heart block  but consistent with normal sinus rhythm on my read, rate in the 70s with no ischemic changes.  We will further assess with labs including 2 sets of troponin and chest x-ray.  Plan to treat symptomatically with nitroglycerin and reassess, patient thought to have some coronary vasospasm in the past by her cardiologist, Dr. Okey Dupre.  Recent cardiac catheterization earlier this year showed no critical coronary artery disease, coronary stenosis found to be no greater than 50%.  2 sets of troponin are within normal limits, chest x-ray unremarkable.  Remainder of labs are reassuring with no significant anemia, leukocytosis, electrolyte abnormality, or AKI.  Chest pain now seems to have resolved following 3 doses of nitroglycerin, patient did have brief drop in blood pressure that improved with IV fluids, likely secondary to nitroglycerin administration.  On reassessment, patient reports she is feeling much better and currently symptom-free.  She is appropriate for discharge home with cardiology follow-up, was counseled to return to the ED for new or worsening symptoms.  Patient agrees with plan.      FINAL CLINICAL IMPRESSION(S) / ED DIAGNOSES   Final diagnoses:  Nonspecific chest pain     Rx / DC Orders   ED Discharge Orders          Ordered    Ambulatory referral to Cardiology        03/14/23 1301             Note:  This document was prepared using Dragon voice recognition software and may include unintentional dictation errors.   Chesley Noon, MD 03/14/23 1302

## 2023-03-14 NOTE — ED Triage Notes (Signed)
Pt arrives via EMS from home for pleuritic chest wall pain. Pt is currently following with pulmonology for her sx. Pt reports associated difficulty breathing. Pt reports this pain woke her from sleep. Pt also reports pain between her shoulder blades this morning. Pt respirations even, non-labored. Pt reports the pain is concentrated to R side and under her ribs.

## 2023-03-14 NOTE — Telephone Encounter (Signed)
Patient called in to let Dr Reece Agar Know that she is about to be transported to the hospital by EMS. She said that she is having a really hard time breathing this morning and she is just all over the place and having a hard time.

## 2023-03-14 NOTE — Telephone Encounter (Addendum)
Spoke with pt relaying Dr. Timoteo Expose message. Pt verbalizes understanding and will contact cards to check on a sooner.  Also, pt confirms she is taking Imdur 1/2 tab daily.

## 2023-03-15 ENCOUNTER — Telehealth: Payer: Self-pay

## 2023-03-21 NOTE — Progress Notes (Unsigned)
Cardiology Office Note  Date:  03/23/2023   ID:  Cassandra Benson [birth year], MRN [id number]  PCP:  Cassandra Boyden, MD   Chief Complaint  Patient presents with   [facility] follow up; chest pain     Patient c/o chest discomfort, bilateral LE edema and shortness of breath.Medications reviewed by the patient verbally.     HPI:  Cassandra Benson is a very pleasant 82 year old woman with  Chronic chest pain 15 years of diabetes, on insulin,  Hyperlipidemia, prior episodes of chest pain,  80% stenosis of the left internal carotid artery.  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 Okey Dupre 12/02/22: nonobstructive disease  Having more chest pain Starts right shoulder into mediastinum  Concerned about family hx of CAD, valve disease  Prior stress Lost grand son-suicide, lived in [place]  No stamina, can't keep up with friends with walking Walks 4 blocks  Cardiac CTA 2/23: no CAD  Taking NTG as needed  Labs reviewed A1C 8.9,  Total chol 178, LDL 102  EKG personally reviewed by myself on todays visit EKG Interpretation  Date/Time:  Thursday March 23 2023 09:14:43 EDT Ventricular Rate:  78 PR Interval:  176 QRS Duration: 76 QT Interval:  374 QTC Calculation: 426 R Axis:   10 Text Interpretation: Normal sinus rhythm Normal ECG When compared with ECG of 23-Mar-2023 09:13, No significant change was found Confirmed by Cassandra Benson [phone]) on 03/23/2023 9:33:08 AM     Other past  medical history reviewed emergency room March 2022 for chest pain, shoulder pain felt to be atypical in nature Atypical pain --CTA chest showed no PE or other acute abnormality.   stressful 2016-04-23 husband died in 2016-04-23, cancer Was diagnosed late spring, died several months later from metastatic disease lonely at nighttime  Previous episodes of chest pain,  woke up with some chest discomfort.  Went to the emergency room, cardiac enzymes negative 3,  chest CT with no PE.   She does report prior cardiac catheterization in April 24, 1999 showing no significant disease CT scan done 04/2015 in the emergency room shows no coronary calcifications, minimal minimal descending aorta atherosclerosis    PMH:   has a past medical history of Acute diverticulitis (04/2021), Allergy, ANA positive, Arthritis, Carotid stenosis, asymptomatic (06/19/2015), CHF (congestive heart failure) (HCC), Colon polyps, COVID-19 virus infection (09/14/2021), Dermatomyositis (HCC), Diabetes mellitus without complication (HCC), Diverticulosis, Family history of adverse reaction to anesthesia, Fibromyalgia, GERD (gastroesophageal reflux disease), Glaucoma, History of blood clots, History of chicken pox, History of diverticulitis, History of pericarditis April 23, 1985), History of pneumonia 04-23-2013), History of shingles, History of UTI, Hyperlipidemia, Hypertension, Hypothyroidism, Mixed connective tissue disease (HCC), Partial small bowel obstruction (HCC) (12/2019), Peptic ulcer, Pneumonia, PONV (postoperative nausea and vomiting), Raynaud's disease without gangrene, Shoulder pain (left), Sigmoid diverticulitis (05/26/2021), Sjogren's syndrome (HCC), Sleep apnea, 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HCC), and Vitamin D deficiency.  PSH:    Past Surgical History:  Procedure Laterality Date   ABDOMINAL HYSTERECTOMY  1978   fibroids and menorrhagia, ovaries remain   ARTERY BIOPSY Right 04/06/2018  Procedure: BIOPSY TEMPORAL ARTERY RIGHT;  Surgeon: Linus Salmons, MD;   Location: [facility];  Service: ENT;  Laterality: Right;  Diabetic - insulin pump sleep apnea   CARDIAC CATHETERIZATION  2000   [facility] normal per patient   COLONOSCOPY  10/2011   1 TA, 1 HP, very tortuous colon ([place])   COLONOSCOPY  02/2020   TA, inflammatory polyp, mod diverticulosis, int/ext hemorrhoids ([place]) no rpt recommended    COLONOSCOPY WITH ESOPHAGOGASTRODUODENOSCOPY (EGD)  03/2007   2 ulcers, benign polyp, rpt 5 yrs [facility], [place])   JOINT REPLACEMENT Right    hip   PARTIAL HIP ARTHROPLASTY  2013   Right hip replacement   RIGHT HEART CATH AND CORONARY ANGIOGRAPHY Bilateral 12/02/2022   Procedure: RIGHT HEART CATH AND CORONARY ANGIOGRAPHY;  Surgeon: Yvonne Kendall, MD;  Location: [facility] INVAS[facility];  Service: Cardiovascular;  Laterality: Bilateral;   TONSILLECTOMY     TONSILLECTOMY AND ADENOIDECTOMY     TRANSCAROTID ARTERY REVASCULARIZATION  Left 07/23/2018   Procedure: TRANSCAROTID ARTERY REVASCULARIZATION;  Surgeon: Cephus Shelling, MD;  Location: [facility] OR;  Service: Vascular;  Laterality: Left;   TUBAL LIGATION     VAGINAL DELIVERY     x2, no complications    Current Outpatient Medications  Medication Sig Dispense Refill   acetaminophen (TYLENOL) 500 MG tablet Take 1,500 mg by mouth daily.     albuterol (VENTOLIN HFA) 108 (90 Base) MCG/ACT inhaler Inhale 2 puffs into the lungs every 6 (six) hours as needed. 8 g 2   Ascorbic Acid (VITAMIN C) 100 MG tablet Take 100 mg by mouth daily.     aspirin EC 81 MG tablet Take 1 tablet (81 mg total) by mouth daily. Swallow whole. 30 tablet 12   Baclofen 5 MG TABS Take 1-2 tablets (5-10 mg total) by mouth at bedtime as needed (neck pain). 30 tablet 3   Cholecalciferol (VITAMIN D3) 25 MCG (1000 UT) CAPS Take 2 capsules (2,000 Units total) by mouth daily.     clopidogrel (PLAVIX) 75 MG tablet Take 1 tablet (75 mg total) by mouth every evening. 30 tablet 2   diclofenac Sodium (VOLTAREN) 1 % GEL Apply 2  g topically 3 (three) times daily. 100 g 1   Fluticasone-Umeclidin-Vilant (TRELEGY ELLIPTA) 100-62.5-25 MCG/ACT AEPB Inhale 1 puff into the lungs daily. 28 each 11   furosemide (LASIX) 20 MG tablet Take 20 mg by mouth daily.     Glucagon, rDNA, (GLUCAGON EMERGENCY) 1 MG KIT INJECT into THE muscle ONCE AS NEEDED FOR emergency 1 kit 12   insulin degludec (TRESIBA FLEXTOUCH) 100 UNIT/ML FlexTouch Pen Inject 60 Units into the skin at bedtime. (Patient taking differently: Inject 60 Units into the skin at bedtime. 60 units daily as divided doses with meals) 54 mL 1   insulin lispro (HUMALOG) 100 UNIT/ML injection Inject 5 Units into the skin 3 (three) times daily before meals.     isosorbide mononitrate (IMDUR) 30 MG 24 hr tablet Take 0.5 tablets (15 mg total) by mouth daily. 30 tablet 6   losartan (COZAAR) 25 MG tablet Take 1 tablet (25 mg total) by mouth daily. 90 tablet 3   metoprolol succinate (TOPROL-XL) 25 MG 24 hr tablet TAKE 1 TABLET(25 MG) BY MOUTH DAILY (Patient taking differently: daily as needed.) 30 tablet 5   Multiple Vitamin (MULTIVITAMIN ADULT) TABS Take 1 tablet by mouth in the morning and at bedtime.     nitroGLYCERIN (NITROSTAT) 0.4 MG SL tablet Place 1 tablet (0.4  mg total) under the tongue every 5 (five) minutes as needed for chest pain. 25 tablet PRN   rosuvastatin (CRESTOR) 20 MG tablet Take 1 tablet (20 mg total) by mouth every evening. 90 tablet 4   spironolactone (ALDACTONE) 25 MG tablet Take 0.5 tablets (12.5 mg total) by mouth daily. 90 tablet 3   SYNTHROID 75 MCG tablet TAKE ONE TABLET BY MOUTH Monday-Saturday BEFORE breakfast 80 tablet 4   topiramate (TOPAMAX) 50 MG tablet Take 1 tablet (50 mg total) by mouth 2 (two) times daily. 60 tablet 12   torsemide (DEMADEX) 20 MG tablet Take 1 tablet (20 mg total) by mouth daily. 30 tablet 5   No current facility-administered medications for this visit.    Allergies:   Iodinated contrast media, Penicillins, Amlodipine, Anectine  [succinylcholine], Carvedilol, Codeine, Gabapentin, Influenza vaccines, Insulin aspart, Nortriptyline, Prednisone, Valsartan, Zetia [ezetimibe], Erythromycin, and Sulfa antibiotics   Social History:  The patient  reports that she has never smoked. She has never used smokeless tobacco. She reports that she does not drink alcohol and does not use drugs.   Family History:   family history includes Alcohol abuse in her brother; Anuerysm in her sister; Breast cancer in her maternal aunt, maternal aunt, and sister; CAD in her sister; CAD (age of onset: 74) in her brother; CAD (age of onset: 36) in her brother; CAD (age of onset: 7) in her father and mother; COPD in her brother and mother; Depression in her grandchild; Diabetes in her brother, sister, and sister; Gallbladder disease in her maternal grandmother; Luiz Blare' disease in her mother; Hypertension in her maternal grandmother; Lupus in her mother and sister; Rheum arthritis in her mother; Stroke in her brother, maternal grandmother, and sister.    Review of Systems: Review of Systems  Constitutional:  Positive for malaise/fatigue.  HENT: Negative.    Respiratory: Negative.    Cardiovascular: Negative.   Gastrointestinal: Negative.   Musculoskeletal: Negative.   Neurological:  Positive for headaches.  Psychiatric/Behavioral: Negative.    All other systems reviewed and are negative.   PHYSICAL EXAM: VS:  BP (!) 110/58 (BP Location: Left Arm, Patient Position: Sitting, Cuff Size: Normal)   Pulse 79   Ht 5\' 3"  (1.6 m)   Wt 153 lb 8 oz (69.6 kg)   SpO2 97%   BMI 27.19 kg/m  , BMI Body mass index is 27.19 kg/m. Constitutional:  oriented to person, place, and time. No distress.  HENT:  Head: Grossly normal Eyes:  no discharge. No scleral icterus.  Neck: No JVD, no carotid bruits  Cardiovascular: Regular rate and rhythm, no murmurs appreciated Pulmonary/Chest: Clear to auscultation bilaterally, no wheezes or rails Abdominal: Soft.  no  distension.  no tenderness.  Musculoskeletal: Normal range of motion Neurological:  normal muscle tone. Coordination normal. No atrophy Skin: Skin warm and dry Psychiatric: normal affect, pleasant  Recent Labs: 08/16/2022: Pro B Natriuretic peptide (BNP) 46.0 10/28/2022: TSH 2.49 01/13/2023: ALT 17; B Natriuretic Peptide 74.4 03/14/2023: BUN 16; Creatinine, Ser 0.83; Hemoglobin 13.7; Platelets 212; Potassium 3.9; Sodium 137    Lipid Panel Lab Results  Component Value Date   CHOL 178 10/28/2022   HDL 50.20 10/28/2022   LDLCALC 102 (H) 10/28/2022   TRIG 129.0 10/28/2022      Wt Readings from Last 3 Encounters:  03/23/23 153 lb 8 oz (69.6 kg)  03/14/23 169 lb 8.5 oz (76.9 kg)  02/15/23 156 lb (70.8 kg)     ASSESSMENT AND PLAN:  Essential hypertension -  Blood pressure is well controlled on today's visit. No changes made to the medications.  LADA (latent autoimmune diabetes in adults), managed as type 1 (HCC) Working with  primary care A1C elevated after receiving prednisone   bilateral carotid artery stenosis Tolerating Crestor 20 mg daily,  Could consider zetia  Adjustment disorder with mixed anxiety and depressed mood  loss of grandson through suicide  Chronic stable angina Cardiac CTA completed, no significant disease Nonobstructive disease on cath 3/24 Likely spasm causing chest pain Will order cardiac rehab  Pure hypercholesterolemia Reasonable numbers  on crestor Consider Zetia  Chronic headaches Prior MRI Findings of chronic ischemic microangiopathy and generalized volume loss without a clear lobar predilection.    Total encounter time more than 40 minutes  Greater than 50% was spent in counseling and coordination of care with the patient   Orders Placed This Encounter  Procedures   EKG 12-Lead   EKG 12-Lead     Signed, Dossie Arbour, M.D., Ph.D. 03/23/2023  [facility] [place], [place] [phone]

## 2023-03-22 ENCOUNTER — Telehealth: Payer: Self-pay

## 2023-03-22 NOTE — Telephone Encounter (Signed)
Transition Care Management Follow-up Telephone Call Date of discharge and from where: 03/14/2023 [facility] How have you been since you were released from the hospital? Patient still feels the same Any questions or concerns? No  Items Reviewed: Did the pt receive and understand the discharge instructions provided? Yes  Medications obtained and verified? Yes  Other? No  Any new allergies since your discharge? No  Dietary orders reviewed? Yes Do you have support at home? Yes   Follow up appointments reviewed:  PCP Hospital f/u appt confirmed? No  Scheduled to see  on  @ . Specialist Hospital f/u appt confirmed? Yes  Scheduled to see Antonieta Iba, MD on 03/23/2023 @ [facility]. Are transportation arrangements needed? No  If their condition worsens, is the pt aware to call PCP or go to the Emergency Dept.? Yes Was the patient provided with contact information for the PCP's office or ED? Yes Was to pt encouraged to call back with questions or concerns? Yes  Earla Charlie [facility]  [facility] Population Health Community Resource Care Guide   [email or web address]  ?? [phone]   Website: [email or web address]  [email or web address]

## 2023-03-22 NOTE — Telephone Encounter (Signed)
Transition Care Management Unsuccessful Follow-up Telephone Call  Date of discharge and from where:  03/14/2023 [facility]  Attempts:  1st Attempt  Reason for unsuccessful TCM follow-up call:  No answer/busy  Cortney Mckinney [place]  [facility] Population Health Community Resource Care Guide   [email or web address]  ?? [phone]   Website: [email or web address]  [email or web address]      

## 2023-03-23 ENCOUNTER — Telehealth: Payer: Self-pay | Admitting: Family

## 2023-03-23 ENCOUNTER — Encounter: Payer: PPO | Admitting: Family

## 2023-03-23 ENCOUNTER — Encounter: Payer: Self-pay | Admitting: Cardiovascular Disease

## 2023-03-23 ENCOUNTER — Ambulatory Visit: Payer: PPO | Attending: Cardiovascular Disease | Admitting: Cardiovascular Disease

## 2023-03-23 VITALS — BP 110/58 | HR 79 | Ht 63.0 in | Wt 153.5 lb

## 2023-03-23 DIAGNOSIS — E1169 Type 2 diabetes mellitus with other specified complication: Secondary | ICD-10-CM | POA: Diagnosis not present

## 2023-03-23 DIAGNOSIS — I700 Atherosclerosis of aorta: Secondary | ICD-10-CM

## 2023-03-23 DIAGNOSIS — I10 Essential (primary) hypertension: Secondary | ICD-10-CM

## 2023-03-23 DIAGNOSIS — R0600 Dyspnea, unspecified: Secondary | ICD-10-CM

## 2023-03-23 DIAGNOSIS — I5032 Chronic diastolic (congestive) heart failure: Secondary | ICD-10-CM | POA: Diagnosis not present

## 2023-03-23 DIAGNOSIS — E785 Hyperlipidemia, unspecified: Secondary | ICD-10-CM | POA: Diagnosis not present

## 2023-03-23 DIAGNOSIS — I6522 Occlusion and stenosis of left carotid artery: Secondary | ICD-10-CM | POA: Diagnosis not present

## 2023-03-23 DIAGNOSIS — Z794 Long term (current) use of insulin: Secondary | ICD-10-CM | POA: Diagnosis not present

## 2023-03-23 DIAGNOSIS — R079 Chest pain, unspecified: Secondary | ICD-10-CM | POA: Diagnosis not present

## 2023-03-23 MED ORDER — LOSARTAN POTASSIUM 25 MG PO TABS: 25.0000 mg | ORAL_TABLET | Freq: Every day | ORAL | 3 refills | Status: DC

## 2023-03-23 NOTE — Telephone Encounter (Signed)
Patient did not show for her [facility] appointment on 03/23/23.  

## 2023-03-23 NOTE — Progress Notes (Deleted)
PCP: Eustaquio Boyden, MD (last seen 05/24) Primary Cardiologist: Julien Nordmann, MD (last seen 06/23) HF provider: Dorthula Nettles, MD (last seen 03/24)  HPI:  Cassandra Benson is a 82 yo female with a PMHx significant for HTN, HLD, hypothyroidism, CAD, LADA, HA's, fibromyalgia, PAD, vasospasm, Sjogren's syndrome, and CHF.   Echo 12/23/22: EF 60-65% with Grade I DD. Echo 12/24/21: EF of 60-65% along with mild MR. Echo 07/30/20: EF of 55-60% with grade II diastolic dysfunction.   RHC 12/02/22: No critical coronary artery disease.  The is up to ~50% stenosis at the ostium of the RCA with pressure dampening noted using 19F diagnostic catheter.  Slight improvement noted with intracoronary nitroglycerin suggesting at least some component of vasospasm.  No angiographically significant coronary artery disease noted in the left coronary artery. Normal left and right heart filling pressures. Normal Fick cardiac output/index. Small right radial artery with significant vasospasm throughout the procedure.  Recommend alternate access for future catheterizations. Recommendations: Add isosorbide mononitrate 15 mg daily and as needed sublingual nitroglycerin for antianginal therapy, including possible coronary vasospasm.  Was in the ED 01/13/23 due to SOB/ pedal edema. Elevated D-dimer. VQ scan negative for PE. Was in the ED 11/16/22 due to SOB due to HF exacerbation.   She presents today for a HF f/u visit with a chief complaint of moderate SOB w/ minimal exertion. Chronic in nature. Has associated fatigue, occasional chest pain on right upper chest, minimal pedal edema, palpitations and dizziness especially when bending over at the waist. Has continued on the furosemide as that seems to be working for right now. Has worn compression socks in the past but has to get new ones.   Doing some walking but gets quite SOB. Sister and both parents died of heart conditions. Multiple family members who have also had  aneurysms. Reports that she has to stop and rest often when doing activities due to her SOB/fatigue and sometimes has trouble adapting to her "new normal".   ROS: All systems negative except as listed in HPI, PMH and Problem List.  SH:  Social History   Socioeconomic History   Marital status: Widowed    Spouse name: Not on file   Number of children: 2   Years of education: Not on file   Highest education level: Not on file  Occupational History   Occupation: retired  Tobacco Use   Smoking status: Never   Smokeless tobacco: Never  Vaping Use   Vaping Use: Never used  Substance and Sexual Activity   Alcohol use: No   Drug use: No   Sexual activity: Not on file  Other Topics Concern   Not on file  Social History Narrative   Lives in [place], moved from [place].    Widow - husband decreased 01/2016 of metastatic colon CA   No pets.   Son Rejeanne Madey lives nearby. Daughter lives in [place]. Sister lives 2 blocks away.    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, [employer]   Exercise - limited   Diet - good water, fruits/vegetables daily, limited meat, protein drink every morning   Social Determinants of Health   Financial Resource Strain: Low Risk  (09/27/2022)   Overall Financial Resource Strain (CARDIA)    Difficulty of Paying Living Expenses: Not hard at all  Food Insecurity: No Food Insecurity (09/27/2022)   Hunger Vital Sign    Worried About Running  Out of Food in the Last Year: Never true    Ran Out of Food in the Last Year: Never true  Transportation Needs: No Transportation Needs (09/27/2022)   PRAPARE - Administrator, Civil Service (Medical): No    Lack of Transportation (Non-Medical): No  Physical Activity: Insufficiently Active (09/27/2022)   Exercise Vital Sign    Days of Exercise per Week: 3 days    Minutes of Exercise per Session: 30 min  Stress: No Stress Concern  Present (09/27/2022)   [employer] of Occupational Health - Occupational Stress Questionnaire    Feeling of Stress : Not at all  Social Connections: Moderately Integrated (09/27/2022)   Social Connection and Isolation Panel [NHANES]    Frequency of Communication with Friends and Family: More than three times a week    Frequency of Social Gatherings with Friends and Family: More than three times a week    Attends Religious Services: More than 4 times per year    Active Member of [employer] or Organizations: Yes    Attends Banker Meetings: More than 4 times per year    Marital Status: Widowed  Intimate Partner Violence: Not At Risk (09/27/2022)   Humiliation, Afraid, Rape, and Kick questionnaire    Fear of Current or Ex-Partner: No    Emotionally Abused: No    Physically Abused: No    Sexually Abused: No    FH:  Family History  Problem Relation Age of Onset   CAD Mother 40       MI, aortic valve issues   COPD Mother    Lupus Mother    Luiz Blare' disease Mother    Rheum arthritis Mother    CAD Father 23       CABG x2, aortic valve replacement   Stroke Sister    CAD Sister    Anuerysm Sister        brain   Lupus Sister    Diabetes Sister    Diabetes Sister    Breast cancer Sister    Alcohol abuse Brother    CAD Brother 67       MI   Stroke Brother    COPD Brother        agent orange   CAD Brother 60       stent   Diabetes Brother    Stroke Maternal Grandmother    Hypertension Maternal Grandmother    Gallbladder disease Maternal Grandmother    Breast cancer Maternal Aunt    Breast cancer Maternal Aunt    Depression Grandchild    Colon cancer Neg Hx    Esophageal cancer Neg Hx    Rectal cancer Neg Hx    Stomach cancer Neg Hx     Past Medical History:  Diagnosis Date   Acute diverticulitis 04/2021   [facility] ER, CT confirmed   Allergy    ANA positive    positive ANA pattern 1 speckled   Arthritis    Carotid stenosis, asymptomatic 06/19/2015   1-39% RICA  40-59% LICA rpt 1 yr (05/2015)    CHF (congestive heart failure) (HCC)    Colon polyps    COVID-19 virus infection 09/14/2021   Dermatomyositis (HCC)    Diabetes mellitus without complication (HCC)    Type 1   Diverticulosis    sigmoid on CT scan 12/2019   Family history of adverse reaction to anesthesia    brothr went into cardiac arrest from anectine   Fibromyalgia  prior PCP   GERD (gastroesophageal reflux disease)    prior PCP   Glaucoma    Narrow angle   History of blood clots    DVT, in 20s, none since   History of chicken pox    History of diverticulitis    History of pericarditis 1986   with hospitalization   History of pneumonia 2014   History of shingles    History of UTI    Hyperlipidemia    Hypertension    Hypothyroidism    Mixed connective tissue disease (HCC)    Partial small bowel obstruction (HCC) 12/2019   managed conservatively   Peptic ulcer    Pneumonia    PONV (postoperative nausea and vomiting)    Raynaud's disease without gangrene    Shoulder pain left   h/o RTC tendonitis and adhesive capsulitis   Sigmoid diverticulitis 05/26/2021   Sjogren's syndrome (HCC)    Sleep apnea    prior PCP - no CPAP for about 10 yrs   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HCC)    Vitamin D deficiency    prior PCP    Current Outpatient Medications  Medication Sig Dispense Refill   acetaminophen (TYLENOL) 500 MG tablet Take 1,500 mg by mouth daily.     albuterol (VENTOLIN HFA) 108 (90 Base) MCG/ACT inhaler Inhale 2 puffs into the lungs every 6 (six) hours as needed. 8 g 2   Ascorbic Acid (VITAMIN C) 100 MG tablet Take 100 mg by mouth daily.     aspirin EC 81 MG tablet Take 1 tablet (81 mg total) by mouth daily. Swallow whole. 30 tablet 12   Baclofen 5 MG TABS Take 1-2 tablets (5-10 mg total) by mouth at bedtime as needed (neck pain). 30 tablet 3   Cholecalciferol (VITAMIN D3) 25 MCG (1000 UT) CAPS Take 2 capsules (2,000 Units total) by mouth daily.     clopidogrel (PLAVIX) 75 MG  tablet Take 1 tablet (75 mg total) by mouth every evening. 30 tablet 2   diclofenac Sodium (VOLTAREN) 1 % GEL Apply 2 g topically 3 (three) times daily. 100 g 1   Fluticasone-Umeclidin-Vilant (TRELEGY ELLIPTA) 100-62.5-25 MCG/ACT AEPB Inhale 1 puff into the lungs daily. 28 each 11   furosemide (LASIX) 20 MG tablet Take 20 mg by mouth daily.     Glucagon, rDNA, (GLUCAGON EMERGENCY) 1 MG KIT INJECT into THE muscle ONCE AS NEEDED FOR emergency 1 kit 12   insulin degludec (TRESIBA FLEXTOUCH) 100 UNIT/ML FlexTouch Pen Inject 60 Units into the skin at bedtime. (Patient taking differently: Inject 60 Units into the skin at bedtime. 60 units daily as divided doses with meals) 54 mL 1   insulin lispro (HUMALOG) 100 UNIT/ML injection Inject 5 Units into the skin 3 (three) times daily before meals.     isosorbide mononitrate (IMDUR) 30 MG 24 hr tablet Take 0.5 tablets (15 mg total) by mouth daily. 30 tablet 6   losartan (COZAAR) 25 MG tablet Take 1 tablet (25 mg total) by mouth daily. 90 tablet 3   metoprolol succinate (TOPROL-XL) 25 MG 24 hr tablet TAKE 1 TABLET(25 MG) BY MOUTH DAILY (Patient taking differently: Take 25 mg by mouth daily as needed (swelling).) 30 tablet 5   Multiple Vitamin (MULTIVITAMIN ADULT) TABS Take 1 tablet by mouth in the morning and at bedtime.     nitroGLYCERIN (NITROSTAT) 0.4 MG SL tablet Place 1 tablet (0.4 mg total) under the tongue every 5 (five) minutes as needed for chest pain. 25 tablet PRN  rosuvastatin (CRESTOR) 20 MG tablet Take 1 tablet (20 mg total) by mouth every evening. 90 tablet 4   spironolactone (ALDACTONE) 25 MG tablet Take 0.5 tablets (12.5 mg total) by mouth daily. 90 tablet 3   SYNTHROID 75 MCG tablet TAKE ONE TABLET BY MOUTH Monday-Saturday BEFORE breakfast (Patient taking differently: Take 75 mcg by mouth daily before breakfast. TAKE ONE TABLET BY MOUTH Monday-Saturday BEFORE breakfast) 80 tablet 4   topiramate (TOPAMAX) 50 MG tablet Take 1 tablet (50 mg total)  by mouth 2 (two) times daily. 60 tablet 12   torsemide (DEMADEX) 20 MG tablet Take 1 tablet (20 mg total) by mouth daily. (Patient not taking: Reported on 02/15/2023) 30 tablet 5   No current facility-administered medications for this visit.   There were no vitals filed for this visit.  Wt Readings from Last 3 Encounters:  03/14/23 169 lb 8.5 oz (76.9 kg)  02/15/23 156 lb (70.8 kg)  02/03/23 154 lb 6 oz (70 kg)   Lab Results  Component Value Date   CREATININE 0.83 03/14/2023   CREATININE 0.93 01/13/2023   CREATININE 0.91 11/25/2022   PHYSICAL EXAM:  General:  Well appearing. No resp difficulty HEENT: normal Neck: supple. JVP flat. No lymphadenopathy or thryomegaly appreciated. Cor: PMI normal. Regular rate & rhythm. No rubs, gallops or murmurs. Lungs: clear Abdomen: soft, nontender, nondistended. No hepatosplenomegaly. No bruits or masses. Extremities: no cyanosis, clubbing, rash, trace pitting edema bilateral lower legs Neuro: alert & orientedx3, cranial nerves grossly intact. Moves all 4 extremities w/o difficulty. Affect pleasant.   ECG: not done   ASSESSMENT & PLAN:  NICM with preserved EF- - likely d/t HTN as cath showed no critical CAD - NYHA III - euvolemic - weighing daily; reminded to call for an overnight weight gain of >2 pounds or a weekly weight gain of >5 pounds - weight up 4 pounds from last visit here 2.5 months ago - Echo 12/23/22: EF 60-65% with Grade I DD.  - Echo 12/24/21: EF of 60-65% along with mild MR.  - Echo 07/30/20: EF of 55-60% with grade II diastolic dysfunction.  - RHC 12/02/22: No critical coronary artery disease.  The is up to ~50% stenosis at the ostium of the RCA with pressure dampening noted using 55F diagnostic catheter.  Slight improvement noted with intracoronary nitroglycerin suggesting at least some component of vasospasm.  No angiographically significant coronary artery disease noted in the left coronary artery. Normal left and right  heart filling pressures. Normal Fick cardiac output/index. Small right radial artery with significant vasospasm throughout the procedure.  Recommend alternate access for future catheterizations. - adhering to low sodium diet and fluid restriction - continue losartan 25mg  daily - continue metoprolol succinate 25mg  daily - continue furosemide 20mg  daily - continue spironolactone 12.5mg  daily - valsartan caused hacking cough - can not add SGLT2 due to LADA - saw HF provider Gasper Lloyd) 03/24 - saw cardiology Mariah Milling) 06/23 - encouraged to get compression socks and put them on every morning with removal at betime - BNP 11/16/22 was 59.0 - PharmD reconciled meds w/ patient  2. HTN - BP 119/68 - saw PCP Sharen Hones) 05/24 - BMP 01/13/23 reviewed: sodium 143, potassium 3.3, creatinine 0.93 & GFR >60   3. LADA - saw endocrinology Dell Ponto) 04/24 - A1c 02/03/23 was 8.9%  4: Mild obstructive sleep apnea - saw pulmonology Jayme Cloud) 05/24 - home sleep study done 09/22/22 which showed mild obstructive sleep apnea with AHI of 8.5 and oxygen saturations as low as  82% nocturnally. Patient declined CPAP/oxygen.  - PFT's 04/22/22  Return in 3 months, sooner if needed. At next visit, discussed scheduling appt with HF provider.

## 2023-03-23 NOTE — Patient Instructions (Signed)
Cardiac rehab for chronic stable angina   Medication Instructions:  No changes  If you need a refill on your cardiac medications before your next appointment, please call your pharmacy.   Lab work: No new labs needed  Testing/Procedures: No new testing needed  Follow-Up: At [facility], you and your health needs are our priority.  As part of our continuing mission to provide you with exceptional heart care, we have created designated Provider Care Teams.  These Care Teams include your primary Cardiologist (physician) and Advanced Practice Providers (APPs -  Physician Assistants and Nurse Practitioners) who all work together to provide you with the care you need, when you need it.  You will need a follow up appointment in 12 months  Providers on your designated Care Team:   Nicolasa Ducking, NP Eula Listen, PA-C Cadence Fransico Michael, [place]  COVID-19 Vaccine Information can be found at: [email or web address] For questions related to vaccine distribution or appointments, please email [email or web address] or call [phone].

## 2023-03-24 ENCOUNTER — Encounter: Payer: Self-pay | Admitting: Family Medicine

## 2023-03-27 ENCOUNTER — Ambulatory Visit: Payer: PPO | Admitting: Family Medicine

## 2023-03-27 ENCOUNTER — Encounter: Payer: Self-pay | Admitting: Pulmonary Disease

## 2023-03-27 ENCOUNTER — Encounter: Payer: Self-pay | Admitting: Family Medicine

## 2023-03-27 VITALS — BP 128/70 | HR 84 | Temp 97.5°F | Ht 63.0 in | Wt 157.0 lb

## 2023-03-27 DIAGNOSIS — R079 Chest pain, unspecified: Secondary | ICD-10-CM

## 2023-03-27 DIAGNOSIS — I10 Essential (primary) hypertension: Secondary | ICD-10-CM

## 2023-03-27 DIAGNOSIS — I201 Angina pectoris with documented spasm: Secondary | ICD-10-CM | POA: Diagnosis not present

## 2023-03-27 DIAGNOSIS — I5032 Chronic diastolic (congestive) heart failure: Secondary | ICD-10-CM | POA: Diagnosis not present

## 2023-03-27 DIAGNOSIS — I700 Atherosclerosis of aorta: Secondary | ICD-10-CM | POA: Diagnosis not present

## 2023-03-27 NOTE — Assessment & Plan Note (Signed)
See above

## 2023-03-27 NOTE — Assessment & Plan Note (Addendum)
Recent chest pain episode thought due to this.  Discussed SL nitroglycerin use PRN, continue isosorbide mononitrate 15mg  daily.  Encouraged she go to cardiac rehab.

## 2023-03-27 NOTE — Telephone Encounter (Signed)
error 

## 2023-03-27 NOTE — Progress Notes (Addendum)
Ph: [phone] Fax: [phone]   Patient ID: Cassandra Benson, female    DOB: [birth year], 82 y.o.   MRN: [id number]  This visit was conducted in person.  BP 128/70   Pulse 84   Temp (!) 97.5 F (36.4 C) (Temporal)   Ht 5\' 3"  (1.6 m)   Wt 157 lb (71.2 kg)   SpO2 99%   BMI 27.81 kg/m    CC: follow up visit  Subjective:   HPI: Cassandra Benson is a 82 y.o. female presenting on 03/27/2023 for Results (Wants to discuss recent visit with Dr. Mariah Milling [cards].)   Enjoyed daughter's recent visit in June.   She saw Dr Mariah Milling last week, note reviewed. Cardiac CTA without significant disease. Likely coronary spasms causing chest pain. Has been referred to cardiac rehab.   ER visit 03/14/2023 for chest pain treated with SL nitroglycerin x3, reassuring evaluation. She continues isosorbide mononitrate 30mg  1/2 tab daily.   Reassuring echocardiogram 11/2022.  Upcoming appt Aug with [facility] as well as CHF clinic.  Strong fmhx aortic valve disease however her aortic valve was normal on recent echocardiogram 11/2022.      Relevant past medical, surgical, family and social history reviewed and updated as indicated. Interim medical history since our last visit reviewed. Allergies and medications reviewed and updated. Outpatient Medications Prior to Visit  Medication Sig Dispense Refill   acetaminophen (TYLENOL) 500 MG tablet Take 1,500 mg by mouth daily.     albuterol (VENTOLIN HFA) 108 (90 Base) MCG/ACT inhaler Inhale 2 puffs into the lungs every 6 (six) hours as needed. 8 g 2   Ascorbic Acid (VITAMIN C) 100 MG tablet Take 100 mg by mouth daily.     aspirin EC 81 MG tablet Take 1 tablet (81 mg total) by mouth daily. Swallow whole. 30 tablet 12   Baclofen 5 MG TABS Take 1-2 tablets (5-10 mg total) by mouth at bedtime as needed (neck pain). 30 tablet 3   Cholecalciferol (VITAMIN D3) 25 MCG (1000 UT) CAPS Take 2 capsules (2,000 Units total) by mouth daily.     clopidogrel (PLAVIX) 75  MG tablet Take 1 tablet (75 mg total) by mouth every evening. 30 tablet 2   diclofenac Sodium (VOLTAREN) 1 % GEL Apply 2 g topically 3 (three) times daily. 100 g 1   Fluticasone-Umeclidin-Vilant (TRELEGY ELLIPTA) 100-62.5-25 MCG/ACT AEPB Inhale 1 puff into the lungs daily. 28 each 11   furosemide (LASIX) 20 MG tablet Take 20 mg by mouth daily.     Glucagon, rDNA, (GLUCAGON EMERGENCY) 1 MG KIT INJECT into THE muscle ONCE AS NEEDED FOR emergency 1 kit 12   insulin degludec (TRESIBA FLEXTOUCH) 100 UNIT/ML FlexTouch Pen Inject 60 Units into the skin at bedtime. (Patient taking differently: Inject 60 Units into the skin at bedtime. 60 units daily as divided doses with meals) 54 mL 1   insulin lispro (HUMALOG) 100 UNIT/ML injection Inject 5 Units into the skin 3 (three) times daily before meals.     isosorbide mononitrate (IMDUR) 30 MG 24 hr tablet Take 0.5 tablets (15 mg total) by mouth daily. 30 tablet 6   losartan (COZAAR) 25 MG tablet Take 1 tablet (25 mg total) by mouth daily. 90 tablet 3   metoprolol succinate (TOPROL-XL) 25 MG 24 hr tablet TAKE 1 TABLET(25 MG) BY MOUTH DAILY (Patient taking differently: daily as needed.) 30 tablet 5   Multiple Vitamin (MULTIVITAMIN ADULT) TABS Take 1 tablet by mouth in the morning and at bedtime.  nitroGLYCERIN (NITROSTAT) 0.4 MG SL tablet Place 1 tablet (0.4 mg total) under the tongue every 5 (five) minutes as needed for chest pain. 25 tablet PRN   rosuvastatin (CRESTOR) 20 MG tablet Take 1 tablet (20 mg total) by mouth every evening. 90 tablet 4   spironolactone (ALDACTONE) 25 MG tablet Take 0.5 tablets (12.5 mg total) by mouth daily. 90 tablet 3   SYNTHROID 75 MCG tablet TAKE ONE TABLET BY MOUTH Monday-Saturday BEFORE breakfast 80 tablet 4   topiramate (TOPAMAX) 50 MG tablet Take 1 tablet (50 mg total) by mouth 2 (two) times daily. 60 tablet 12   torsemide (DEMADEX) 20 MG tablet Take 1 tablet (20 mg total) by mouth daily. 30 tablet 5   No  facility-administered medications prior to visit.     Per HPI unless specifically indicated in ROS section below Review of Systems  Objective:  BP 128/70   Pulse 84   Temp (!) 97.5 F (36.4 C) (Temporal)   Ht 5\' 3"  (1.6 m)   Wt 157 lb (71.2 kg)   SpO2 99%   BMI 27.81 kg/m   Wt Readings from Last 3 Encounters:  03/27/23 157 lb (71.2 kg)  03/23/23 153 lb 8 oz (69.6 kg)  03/14/23 169 lb 8.5 oz (76.9 kg)      Physical Exam Vitals and nursing note reviewed.  Constitutional:      Appearance: Normal appearance. She is not ill-appearing.  HENT:     Head: Normocephalic and atraumatic.     Mouth/Throat:     Mouth: Mucous membranes are moist.     Pharynx: Oropharynx is clear. No oropharyngeal exudate or posterior oropharyngeal erythema.  Eyes:     Extraocular Movements: Extraocular movements intact.     Conjunctiva/sclera: Conjunctivae normal.     Pupils: Pupils are equal, round, and reactive to light.  Cardiovascular:     Rate and Rhythm: Normal rate and regular rhythm.     Pulses: Normal pulses.     Heart sounds: Normal heart sounds. No murmur heard. Pulmonary:     Effort: Pulmonary effort is normal. No respiratory distress.     Breath sounds: Normal breath sounds. No wheezing, rhonchi or rales.  Musculoskeletal:     Cervical back: Normal range of motion and neck supple.     Right lower leg: No edema.     Left lower leg: No edema.  Lymphadenopathy:     Cervical: No cervical adenopathy.  Skin:    General: Skin is warm and dry.     Findings: No rash.  Neurological:     Mental Status: She is alert.  Psychiatric:        Mood and Affect: Mood normal.        Behavior: Behavior normal.       Results for orders placed or performed during the hospital encounter of 03/14/23  CBC with Differential  Result Value Ref Range   WBC 8.5 4.0 - 10.5 K/uL   RBC 5.05 3.87 - 5.11 MIL/uL   Hemoglobin 13.7 12.0 - 15.0 g/dL   HCT 95.6 21.3 - 08.6 %   MCV 84.8 80.0 - 100.0 fL   MCH  27.1 26.0 - 34.0 pg   MCHC 32.0 30.0 - 36.0 g/dL   RDW 57.8 46.9 - 62.9 %   Platelets 212 150 - 400 K/uL   nRBC 0.0 0.0 - 0.2 %   Neutrophils Relative % 58 %   Neutro Abs 4.9 1.7 - 7.7 K/uL   Lymphocytes Relative  32 %   Lymphs Abs 2.8 0.7 - 4.0 K/uL   Monocytes Relative 7 %   Monocytes Absolute 0.6 0.1 - 1.0 K/uL   Eosinophils Relative 2 %   Eosinophils Absolute 0.1 0.0 - 0.5 K/uL   Basophils Relative 1 %   Basophils Absolute 0.1 0.0 - 0.1 K/uL   Immature Granulocytes 0 %   Abs Immature Granulocytes 0.03 0.00 - 0.07 K/uL  Basic metabolic panel  Result Value Ref Range   Sodium 137 135 - 145 mmol/L   Potassium 3.9 3.5 - 5.1 mmol/L   Chloride 106 98 - 111 mmol/L   CO2 22 22 - 32 mmol/L   Glucose, Bld 229 (H) 70 - 99 mg/dL   BUN 16 8 - 23 mg/dL   Creatinine, Ser 5.40 0.44 - 1.00 mg/dL   Calcium 9.3 8.9 - 98.1 mg/dL   GFR, Estimated >19 >14 mL/min   Anion gap 9 5 - 15  Troponin I (High Sensitivity)  Result Value Ref Range   Troponin I (High Sensitivity) 4 <18 ng/L  Troponin I (High Sensitivity)  Result Value Ref Range   Troponin I (High Sensitivity) 4 <18 ng/L   *Note: Due to a large number of results and/or encounters for the requested time period, some results have not been displayed. A complete set of results can be found in Results Review.   Lab Results  Component Value Date   CHOL 178 10/28/2022   HDL 50.20 10/28/2022   LDLCALC 102 (H) 10/28/2022   LDLDIRECT 113.0 09/09/2020   TRIG 129.0 10/28/2022   CHOLHDL 4 10/28/2022    Assessment & Plan:   Problem List Items Addressed This Visit     Essential hypertension    Chronic, stable on current regimen - continue.       Chest pain    See above.       Atherosclerosis of aorta (HCC)    Discussed this diagnosis. Reassured her aortic valve is normal on latest imaging.  Continue statin, plavix, aspirin.      Chronic diastolic heart failure (HCC)    Next CHF clinic appt 04/2023      Coronary artery vasospasm  (HCC) - Primary    Recent chest pain episode thought due to this.  Discussed SL nitroglycerin use PRN, continue isosorbide mononitrate 15mg  daily.  Encouraged she go to cardiac rehab.         No orders of the defined types were placed in this encounter.   No orders of the defined types were placed in this encounter.   Patient Instructions  Good to see you today I do think cardiac rehab is a good idea.  Continue current medicines including isosorbide and rosuvastatin.  Keep follow up appointment in September.   Follow up plan: Return if symptoms worsen or fail to improve.  Eustaquio Boyden, MD

## 2023-03-27 NOTE — Patient Instructions (Addendum)
Good to see you today I do think cardiac rehab is a good idea.  Continue current medicines including isosorbide and rosuvastatin.  Keep follow up appointment in September.

## 2023-03-27 NOTE — Telephone Encounter (Signed)
Seen in office today  

## 2023-03-27 NOTE — Assessment & Plan Note (Signed)
Next CHF clinic appt 04/2023

## 2023-03-27 NOTE — Assessment & Plan Note (Signed)
Chronic, stable on current regimen - continue. 

## 2023-03-27 NOTE — Assessment & Plan Note (Signed)
Discussed this diagnosis. Reassured her aortic valve is normal on latest imaging.  Continue statin, plavix, aspirin.

## 2023-04-06 NOTE — Telephone Encounter (Signed)
  Pt called requesting to speak with Cassandra Benson. Pt was seen in hsop yesterday for CP. Pt feels a lot better today and normal per pt. Pt is concerned about what to do next and who to f/u w/ based on hosp advisory.

## 2023-04-12 ENCOUNTER — Encounter: Payer: PPO | Admitting: Family

## 2023-04-18 ENCOUNTER — Encounter: Payer: Self-pay | Admitting: Family

## 2023-04-18 ENCOUNTER — Ambulatory Visit: Payer: PPO | Attending: Family | Admitting: Family

## 2023-04-18 VITALS — BP 125/76 | HR 71 | Wt 156.0 lb

## 2023-04-18 DIAGNOSIS — M3500 Sicca syndrome, unspecified: Secondary | ICD-10-CM | POA: Diagnosis not present

## 2023-04-18 DIAGNOSIS — Z79899 Other long term (current) drug therapy: Secondary | ICD-10-CM | POA: Insufficient documentation

## 2023-04-18 DIAGNOSIS — E785 Hyperlipidemia, unspecified: Secondary | ICD-10-CM | POA: Diagnosis not present

## 2023-04-18 DIAGNOSIS — I428 Other cardiomyopathies: Secondary | ICD-10-CM | POA: Diagnosis not present

## 2023-04-18 DIAGNOSIS — I2510 Atherosclerotic heart disease of native coronary artery without angina pectoris: Secondary | ICD-10-CM | POA: Diagnosis not present

## 2023-04-18 DIAGNOSIS — I509 Heart failure, unspecified: Secondary | ICD-10-CM | POA: Diagnosis not present

## 2023-04-18 DIAGNOSIS — E139 Other specified diabetes mellitus without complications: Secondary | ICD-10-CM | POA: Diagnosis not present

## 2023-04-18 DIAGNOSIS — I5032 Chronic diastolic (congestive) heart failure: Secondary | ICD-10-CM

## 2023-04-18 DIAGNOSIS — R059 Cough, unspecified: Secondary | ICD-10-CM | POA: Insufficient documentation

## 2023-04-18 DIAGNOSIS — M797 Fibromyalgia: Secondary | ICD-10-CM | POA: Insufficient documentation

## 2023-04-18 DIAGNOSIS — R0602 Shortness of breath: Secondary | ICD-10-CM | POA: Insufficient documentation

## 2023-04-18 DIAGNOSIS — G4733 Obstructive sleep apnea (adult) (pediatric): Secondary | ICD-10-CM | POA: Diagnosis not present

## 2023-04-18 DIAGNOSIS — E039 Hypothyroidism, unspecified: Secondary | ICD-10-CM | POA: Diagnosis not present

## 2023-04-18 DIAGNOSIS — I110 Hypertensive heart disease with heart failure: Secondary | ICD-10-CM | POA: Diagnosis not present

## 2023-04-18 DIAGNOSIS — I10 Essential (primary) hypertension: Secondary | ICD-10-CM

## 2023-04-18 MED ORDER — TORSEMIDE 20 MG PO TABS: 40.0000 mg | ORAL_TABLET | Freq: Every day | ORAL | 5 refills | Status: DC

## 2023-04-18 NOTE — Patient Instructions (Signed)
Stop taking furosemide.   Start taking 40mg  torsemide daily. This will be 2 tablets every day.

## 2023-04-18 NOTE — Progress Notes (Signed)
PCP: Eustaquio Boyden, MD (last seen 07/24) Primary Cardiologist: Julien Nordmann, MD (last seen 06/24) HF provider: Dorthula Nettles, MD (last seen 03/24)  HPI:  Cassandra Benson is a 82 yo female with a PMHx significant for HTN, HLD, hypothyroidism, CAD, LADA, HA's, fibromyalgia, PAD, vasospasm, Sjogren's syndrome, and CHF.   Was in the ED 11/16/22 due to SOB due to HF exacerbation. Was in the ED 01/13/23 due to SOB/ pedal edema. Elevated D-dimer. VQ scan negative for PE. Was in the ED 03/14/23 due to chest pain that woke her from sleep associated with chest pressure and heaviness in both sides of her chest. EKG, CXR and labs were unremarkable and she was released with improvement of pain.   Echo 07/30/20: EF of 55-60% with grade II diastolic dysfunction. Echo 12/24/21: EF of 60-65% along with mild MR.  Echo 12/23/22: EF 60-65% with Grade I DD.   RHC 12/02/22: No critical coronary artery disease.  The is up to ~50% stenosis at the ostium of the RCA with pressure dampening noted using 56F diagnostic catheter.  Slight improvement noted with intracoronary nitroglycerin suggesting at least some component of vasospasm.  No angiographically significant coronary artery disease noted in the left coronary artery. Normal left and right heart filling pressures. Normal Fick cardiac output/index. Small right radial artery with significant vasospasm throughout the procedure.  Recommend alternate access for future catheterizations. Recommendations: Add isosorbide mononitrate 15 mg daily and as needed sublingual nitroglycerin for antianginal therapy, including possible coronary vasospasm.  She presents today for a HF f/u visit with a chief complaint of moderate fatigue with minimal exertion. Chronic in nature. Has associated SOB (easily), intermittent chest pain, intermittent dizziness/ lightheadness, minimal edema and at times difficulty falling asleep. Denies palpitations or weight gain.   Has been taking 20mg   furosemide with 20mg  torsemide PRN. She's unsure as to why she has both of them. Takes her metoprolol PRN and will take it if she starts to feel bad/ weak.   Reports frustration in not being able to do the activities that she used to such as going to lunch with friends. She says that it will wear her out even doing something like that. She has to space her housework out due to her fatigue and SOB.   ROS: All systems negative except as listed in HPI, PMH and Problem List.  SH:  Social History   Socioeconomic History   Marital status: Widowed    Spouse name: Not on file   Number of children: 2   Years of education: Not on file   Highest education level: Not on file  Occupational History   Occupation: retired  Tobacco Use   Smoking status: Never   Smokeless tobacco: Never  Vaping Use   Vaping status: Never Used  Substance and Sexual Activity   Alcohol use: No   Drug use: No   Sexual activity: Not on file  Other Topics Concern   Not on file  Social History Narrative   Lives in [place], moved from [place].    Widow - husband decreased 01/2016 of metastatic colon CA   No pets.   Son Amany Rando lives nearby. Daughter lives in [place]. Sister lives 2 blocks away.    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, [employer]   Exercise - limited   Diet - good water, fruits/vegetables daily, limited meat, protein drink every morning   Social  Determinants of Health   Financial Resource Strain: Low Risk  (09/27/2022)   Overall Financial Resource Strain (CARDIA)    Difficulty of Paying Living Expenses: Not hard at all  Food Insecurity: No Food Insecurity (09/27/2022)   Hunger Vital Sign    Worried About Running Out of Food in the Last Year: Never true    Ran Out of Food in the Last Year: Never true  Transportation Needs: No Transportation Needs (09/27/2022)   PRAPARE - Administrator, Civil Service  (Medical): No    Lack of Transportation (Non-Medical): No  Physical Activity: Insufficiently Active (09/27/2022)   Exercise Vital Sign    Days of Exercise per Week: 3 days    Minutes of Exercise per Session: 30 min  Stress: No Stress Concern Present (09/27/2022)   [employer] of Occupational Health - Occupational Stress Questionnaire    Feeling of Stress : Not at all  Social Connections: Moderately Integrated (09/27/2022)   Social Connection and Isolation Panel [NHANES]    Frequency of Communication with Friends and Family: More than three times a week    Frequency of Social Gatherings with Friends and Family: More than three times a week    Attends Religious Services: More than 4 times per year    Active Member of [employer] or Organizations: Yes    Attends Banker Meetings: More than 4 times per year    Marital Status: Widowed  Intimate Partner Violence: Not At Risk (09/27/2022)   Humiliation, Afraid, Rape, and Kick questionnaire    Fear of Current or Ex-Partner: No    Emotionally Abused: No    Physically Abused: No    Sexually Abused: No    FH:  Family History  Problem Relation Age of Onset   CAD Mother 61       MI, aortic valve issues   COPD Mother    Lupus Mother    Luiz Blare' disease Mother    Rheum arthritis Mother    CAD Father 42       CABG x2, aortic valve replacement   Stroke Sister    CAD Sister    Anuerysm Sister        brain   Lupus Sister    Diabetes Sister    Diabetes Sister    Breast cancer Sister    Alcohol abuse Brother    CAD Brother 51       MI   Stroke Brother    COPD Brother        agent orange   CAD Brother 60       stent   Diabetes Brother    Stroke Maternal Grandmother    Hypertension Maternal Grandmother    Gallbladder disease Maternal Grandmother    Breast cancer Maternal Aunt    Breast cancer Maternal Aunt    Depression Grandchild    Colon cancer Neg Hx    Esophageal cancer Neg Hx    Rectal cancer Neg Hx    Stomach cancer  Neg Hx     Past Medical History:  Diagnosis Date   Acute diverticulitis 04/2021   [facility], CT confirmed   Allergy    ANA positive    positive ANA pattern 1 speckled   Arthritis    Carotid stenosis, asymptomatic 06/19/2015   1-39% RICA 40-59% LICA rpt 1 yr (05/2015)    CHF (congestive heart failure) (HCC)    Colon polyps    COVID-19 virus infection 09/14/2021   Dermatomyositis (HCC)  Diabetes mellitus without complication (HCC)    Type 1   Diverticulosis    sigmoid on CT scan 12/2019   Family history of adverse reaction to anesthesia    brothr went into cardiac arrest from anectine   Fibromyalgia    prior PCP   GERD (gastroesophageal reflux disease)    prior PCP   Glaucoma    Narrow angle   History of blood clots    DVT, in 20s, none since   History of chicken pox    History of diverticulitis    History of pericarditis 1986   with hospitalization   History of pneumonia 2014   History of shingles    History of UTI    Hyperlipidemia    Hypertension    Hypothyroidism    Mixed connective tissue disease (HCC)    Partial small bowel obstruction (HCC) 12/2019   managed conservatively   Peptic ulcer    Pneumonia    PONV (postoperative nausea and vomiting)    Raynaud's disease without gangrene    Shoulder pain left   h/o RTC tendonitis and adhesive capsulitis   Sigmoid diverticulitis 05/26/2021   Sjogren's syndrome (HCC)    Sleep apnea    prior PCP - no CPAP for about 10 yrs   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HCC)    Vitamin D deficiency    prior PCP    Current Outpatient Medications  Medication Sig Dispense Refill   acetaminophen (TYLENOL) 500 MG tablet Take 1,500 mg by mouth daily.     albuterol (VENTOLIN HFA) 108 (90 Base) MCG/ACT inhaler Inhale 2 puffs into the lungs every 6 (six) hours as needed. 8 g 2   Ascorbic Acid (VITAMIN C) 100 MG tablet Take 100 mg by mouth daily.     aspirin EC 81 MG tablet Take 1 tablet (81 mg total) by mouth daily. Swallow whole. 30 tablet 12    Baclofen 5 MG TABS Take 1-2 tablets (5-10 mg total) by mouth at bedtime as needed (neck pain). 30 tablet 3   Cholecalciferol (VITAMIN D3) 25 MCG (1000 UT) CAPS Take 2 capsules (2,000 Units total) by mouth daily.     clopidogrel (PLAVIX) 75 MG tablet Take 1 tablet (75 mg total) by mouth every evening. 30 tablet 2   diclofenac Sodium (VOLTAREN) 1 % GEL Apply 2 g topically 3 (three) times daily. 100 g 1   Fluticasone-Umeclidin-Vilant (TRELEGY ELLIPTA) 100-62.5-25 MCG/ACT AEPB Inhale 1 puff into the lungs daily. 28 each 11   furosemide (LASIX) 20 MG tablet Take 20 mg by mouth daily.     Glucagon, rDNA, (GLUCAGON EMERGENCY) 1 MG KIT INJECT into THE muscle ONCE AS NEEDED FOR emergency 1 kit 12   insulin degludec (TRESIBA FLEXTOUCH) 100 UNIT/ML FlexTouch Pen Inject 60 Units into the skin at bedtime. (Patient taking differently: Inject 60 Units into the skin at bedtime. 60 units daily as divided doses with meals) 54 mL 1   insulin lispro (HUMALOG) 100 UNIT/ML injection Inject 5 Units into the skin 3 (three) times daily before meals.     isosorbide mononitrate (IMDUR) 30 MG 24 hr tablet Take 0.5 tablets (15 mg total) by mouth daily. 30 tablet 6   losartan (COZAAR) 25 MG tablet Take 1 tablet (25 mg total) by mouth daily. 90 tablet 3   metoprolol succinate (TOPROL-XL) 25 MG 24 hr tablet TAKE 1 TABLET(25 MG) BY MOUTH DAILY (Patient taking differently: daily as needed.) 30 tablet 5   Multiple Vitamin (MULTIVITAMIN ADULT) TABS Take 1 tablet  by mouth in the morning and at bedtime.     nitroGLYCERIN (NITROSTAT) 0.4 MG SL tablet Place 1 tablet (0.4 mg total) under the tongue every 5 (five) minutes as needed for chest pain. 25 tablet PRN   rosuvastatin (CRESTOR) 20 MG tablet Take 1 tablet (20 mg total) by mouth every evening. 90 tablet 4   spironolactone (ALDACTONE) 25 MG tablet Take 0.5 tablets (12.5 mg total) by mouth daily. 90 tablet 3   SYNTHROID 75 MCG tablet TAKE ONE TABLET BY MOUTH Monday-Saturday BEFORE  breakfast 80 tablet 4   topiramate (TOPAMAX) 50 MG tablet Take 1 tablet (50 mg total) by mouth 2 (two) times daily. 60 tablet 12   torsemide (DEMADEX) 20 MG tablet Take 1 tablet (20 mg total) by mouth daily. 30 tablet 5   No current facility-administered medications for this visit.   Vitals:   04/18/23 1036  BP: 125/76  Pulse: 71  SpO2: 100%  Weight: 156 lb (70.8 kg)   Wt Readings from Last 3 Encounters:  04/18/23 156 lb (70.8 kg)  03/27/23 157 lb (71.2 kg)  03/23/23 153 lb 8 oz (69.6 kg)   Lab Results  Component Value Date   CREATININE 0.83 03/14/2023   CREATININE 0.93 01/13/2023   CREATININE 0.91 11/25/2022   PHYSICAL EXAM:  General:  Well appearing. No resp difficulty HEENT: normal Neck: supple. JVP flat. No lymphadenopathy or thryomegaly appreciated. Cor: PMI normal. Regular rate & rhythm. No rubs, gallops or murmurs. Lungs: clear Abdomen: soft, nontender, nondistended. No hepatosplenomegaly. No bruits or masses. Extremities: no cyanosis, clubbing, rash, trace pitting edema bilateral lower legs Neuro: alert & oriented x3, cranial nerves grossly intact. Moves all 4 extremities w/o difficulty. Affect pleasant.   ECG: 03/23/23 showed NSR with HR 78   ASSESSMENT & PLAN:  NICM with preserved EF- - likely d/t HTN as cath showed no critical CAD - NYHA III - euvolemic - weighing daily; reminded to call for an overnight weight gain of >2 pounds or a weekly weight gain of >5 pounds - weight stable from last visit here 2 months ago - Echo 07/30/20: EF of 55-60% with grade II diastolic dysfunction.  - Echo 9/60/45: EF of 60-65% along with mild MR.  - Echo 12/23/22: EF 60-65% with Grade I DD.  - RHC 12/02/22: No critical coronary artery disease.  The is up to ~50% stenosis at the ostium of the RCA with pressure dampening noted using 44F diagnostic catheter.  Slight improvement noted with intracoronary nitroglycerin suggesting at least some component of vasospasm.  No  angiographically significant coronary artery disease noted in the left coronary artery. Normal left and right heart filling pressures. Normal Fick cardiac output/index. Small right radial artery with significant vasospasm throughout the procedure.  Recommend alternate access for future catheterizations. - adhering to low sodium diet and fluid restriction - continue losartan 25mg  daily - continue metoprolol succinate 25mg  daily PRN - stop furosemide and take torsemide 40mg  daily - continue spironolactone 12.5mg  daily - valsartan caused hacking cough - can not add SGLT2 due to LADA - saw HF provider Gasper Lloyd) 03/24 - saw cardiology Mariah Milling) 06/24 - BNP 01/13/23 was 74.4  2. HTN - BP 125/76 - saw PCP Sharen Hones) 07/24 - BMP 03/14/23 reviewed: sodium 137, potassium 3.9, creatinine 0.83 & GFR >60   3. LADA - saw endocrinology Dell Ponto) 04/24 - A1c 02/03/23 was 8.9%  4: Mild obstructive sleep apnea - saw pulmonology Jayme Cloud) 05/24 - home sleep study done 09/22/22 which showed mild obstructive  sleep apnea with AHI of 8.5 and oxygen saturations as low as 82% nocturnally. Patient declined CPAP/oxygen.  - PFT's 04/22/22  Return in 2 months, sooner if needed

## 2023-04-20 DIAGNOSIS — H353132 Nonexudative age-related macular degeneration, bilateral, intermediate dry stage: Secondary | ICD-10-CM | POA: Diagnosis not present

## 2023-04-20 DIAGNOSIS — Z961 Presence of intraocular lens: Secondary | ICD-10-CM | POA: Diagnosis not present

## 2023-04-20 DIAGNOSIS — H43813 Vitreous degeneration, bilateral: Secondary | ICD-10-CM | POA: Diagnosis not present

## 2023-04-20 DIAGNOSIS — E119 Type 2 diabetes mellitus without complications: Secondary | ICD-10-CM | POA: Diagnosis not present

## 2023-05-02 ENCOUNTER — Other Ambulatory Visit: Payer: Self-pay | Admitting: Cardiovascular Disease

## 2023-05-02 DIAGNOSIS — I70209 Unspecified atherosclerosis of native arteries of extremities, unspecified extremity: Secondary | ICD-10-CM | POA: Diagnosis not present

## 2023-05-02 DIAGNOSIS — I110 Hypertensive heart disease with heart failure: Secondary | ICD-10-CM | POA: Diagnosis not present

## 2023-05-02 DIAGNOSIS — I509 Heart failure, unspecified: Secondary | ICD-10-CM | POA: Diagnosis not present

## 2023-05-02 DIAGNOSIS — M316 Other giant cell arteritis: Secondary | ICD-10-CM | POA: Diagnosis not present

## 2023-05-02 DIAGNOSIS — M3500 Sicca syndrome, unspecified: Secondary | ICD-10-CM | POA: Diagnosis not present

## 2023-05-02 DIAGNOSIS — E109 Type 1 diabetes mellitus without complications: Secondary | ICD-10-CM | POA: Diagnosis not present

## 2023-05-02 DIAGNOSIS — E785 Hyperlipidemia, unspecified: Secondary | ICD-10-CM | POA: Diagnosis not present

## 2023-05-04 ENCOUNTER — Ambulatory Visit: Payer: PPO | Admitting: Pulmonary Disease

## 2023-05-08 NOTE — Progress Notes (Unsigned)
Patient name: Cassandra Benson MRN: [id number] DOB: [birth year] Sex: female  REASON FOR VISIT: 1 year follow-up carotid surveillance, previous left TCAR  HPI: Cassandra Benson is a 82 y.o. female with hx DM, CHF, HTN, HLD that presents for one year follow-up of her carotid disease.  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  She remains on aspirin Plavix statin.    She states in June she had an episode where she had brief 1 second right eye vision loss followed by left eye vision loss that lasted about 1 second as well.  These resolved almost immediately.  She was seen by an ophthalmologist who did not see any acute changes on eye exam.  An MRI brain was ordered by her PCP that was negative for any acute infarct.  She has had no recurrent events.  At her neurologic baseline today.  Past Medical History:  Diagnosis Date   Acute diverticulitis 04/2021   [facility] ER, CT confirmed   Allergy    ANA positive    positive ANA pattern 1 speckled   Arthritis    Carotid stenosis, asymptomatic 06/19/2015   1-39% RICA 40-59% LICA rpt 1 yr (05/2015)    CHF (congestive heart failure) (HCC)    Colon polyps    COVID-19 virus infection 09/14/2021   Dermatomyositis (HCC)    Diabetes mellitus without complication (HCC)    Type 1   Diverticulosis    sigmoid on CT scan 12/2019   Family history of adverse reaction to anesthesia    brothr went into cardiac arrest from anectine   Fibromyalgia    prior PCP   GERD (gastroesophageal reflux disease)    prior PCP   Glaucoma    Narrow angle   History of blood clots    DVT, in 20s, none since   History of chicken pox    History of diverticulitis    History of pericarditis 1986   with hospitalization   History of pneumonia 2014   History of shingles    History of UTI    Hyperlipidemia    Hypertension    Hypothyroidism    Mixed connective tissue disease  (HCC)    Partial small bowel obstruction (HCC) 12/2019   managed conservatively   Peptic ulcer    Pneumonia    PONV (postoperative nausea and vomiting)    Raynaud's disease without gangrene    Shoulder pain left   h/o RTC tendonitis and adhesive capsulitis   Sigmoid diverticulitis 05/26/2021   Sjogren's syndrome (HCC)    Sleep apnea    prior PCP - no CPAP for about 10 yrs   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HCC)    Vitamin D deficiency    prior PCP    Past Surgical History:  Procedure Laterality Date   ABDOMINAL HYSTERECTOMY  1978   fibroids and menorrhagia, ovaries remain   ARTERY BIOPSY Right 04/06/2018   Procedure: BIOPSY TEMPORAL ARTERY RIGHT;  Surgeon: Linus Salmons, MD;  Location: [facility];  Service: ENT;  Laterality: Right;  Diabetic - insulin pump sleep apnea   CARDIAC CATHETERIZATION  2000   [facility] normal per patient   COLONOSCOPY  10/2011   1 TA, 1 HP, very tortuous colon ([place])   COLONOSCOPY  02/2020   TA, inflammatory polyp, mod diverticulosis, int/ext hemorrhoids ([place]) no rpt recommended    COLONOSCOPY WITH ESOPHAGOGASTRODUODENOSCOPY (EGD)  03/2007   2 ulcers,  benign polyp, rpt 5 yrs [facility] 2)   JOINT REPLACEMENT Right    hip   PARTIAL HIP ARTHROPLASTY  2013   Right hip replacement   RIGHT HEART CATH AND CORONARY ANGIOGRAPHY Bilateral 12/02/2022   Procedure: RIGHT HEART CATH AND CORONARY ANGIOGRAPHY;  Surgeon: Yvonne Kendall, MD;  Location: [facility];  Service: Cardiovascular;  Laterality: Bilateral;   TONSILLECTOMY     TONSILLECTOMY AND ADENOIDECTOMY     TRANSCAROTID ARTERY REVASCULARIZATION  Left 07/23/2018   Procedure: TRANSCAROTID ARTERY REVASCULARIZATION;  Surgeon: Cephus Shelling, MD;  Location: [facility] OR;  Service: Vascular;  Laterality: Left;   TUBAL LIGATION     VAGINAL DELIVERY     x2, no complications    Family History  Problem Relation Age of Onset   CAD Mother 60       MI, aortic valve issues   COPD  Mother    Lupus Mother    Luiz Blare' disease Mother    Rheum arthritis Mother    CAD Father 40       CABG x2, aortic valve replacement   Stroke Sister    CAD Sister    Anuerysm Sister        brain   Lupus Sister    Diabetes Sister    Diabetes Sister    Breast cancer Sister    Alcohol abuse Brother    CAD Brother 35       MI   Stroke Brother    COPD Brother        agent orange   CAD Brother 60       stent   Diabetes Brother    Stroke Maternal Grandmother    Hypertension Maternal Grandmother    Gallbladder disease Maternal Grandmother    Breast cancer Maternal Aunt    Breast cancer Maternal Aunt    Depression Grandchild    Colon cancer Neg Hx    Esophageal cancer Neg Hx    Rectal cancer Neg Hx    Stomach cancer Neg Hx     SOCIAL HISTORY: Social History   Tobacco Use   Smoking status: Never   Smokeless tobacco: Never  Substance Use Topics   Alcohol use: No    Allergies  Allergen Reactions   Iodinated Contrast Media Other (See Comments)    Itching (severe) and chest tightness   Penicillins Anaphylaxis, Swelling, Rash and Other (See Comments)    Has patient had a PCN reaction causing immediate rash, facial/tongue/throat swelling, SOB or lightheadedness with hypotension: Yes Has patient had a PCN reaction causing severe rash involving mucus membranes or skin necrosis: yes - remotely Has patient had a PCN reaction that required hospitalization: occurred while hospitalized Has patient had a PCN reaction occurring within the last 10 years: No If all of the above answers are "NO", then may proceed with Cephalosporin use.    Amlodipine Swelling    Pedal edema   Anectine [Succinylcholine] Other (See Comments)    Brother went into cardiac arrest.   Carvedilol Dermatitis    Facial acne   Codeine Nausea Only   Gabapentin Other (See Comments)    Gait abnormality   Influenza Vaccines Other (See Comments)    Muscle weakness; unable to walk   Insulin Aspart Other (See  Comments)    headache   Nortriptyline Other (See Comments)    Eye swelling and mouth drawed up   Prednisone     Marked severe hyperglycemia to oral and IM steroids  Valsartan Other (See Comments) and Cough    Allergy to generic only, "Hacking" cough   Zetia [Ezetimibe] Other (See Comments)    Bad muscle cramps   Erythromycin Rash and Swelling   Sulfa Antibiotics Rash    Current Outpatient Medications  Medication Sig Dispense Refill   acetaminophen (TYLENOL) 500 MG tablet Take 1,500 mg by mouth daily.     albuterol (VENTOLIN HFA) 108 (90 Base) MCG/ACT inhaler Inhale 2 puffs into the lungs every 6 (six) hours as needed. 8 g 2   Ascorbic Acid (VITAMIN C) 100 MG tablet Take 100 mg by mouth daily.     aspirin EC 81 MG tablet Take 1 tablet (81 mg total) by mouth daily. Swallow whole. 30 tablet 12   Baclofen 5 MG TABS Take 1-2 tablets (5-10 mg total) by mouth at bedtime as needed (neck pain). 30 tablet 3   Cholecalciferol (VITAMIN D3) 25 MCG (1000 UT) CAPS Take 2 capsules (2,000 Units total) by mouth daily.     clopidogrel (PLAVIX) 75 MG tablet Take 1 tablet (75 mg total) by mouth every evening. 30 tablet 2   diclofenac Sodium (VOLTAREN) 1 % GEL Apply 2 g topically 3 (three) times daily. 100 g 1   Fluticasone-Umeclidin-Vilant (TRELEGY ELLIPTA) 100-62.5-25 MCG/ACT AEPB Inhale 1 puff into the lungs daily. 28 each 11   Glucagon, rDNA, (GLUCAGON EMERGENCY) 1 MG KIT INJECT into THE muscle ONCE AS NEEDED FOR emergency 1 kit 12   insulin degludec (TRESIBA FLEXTOUCH) 100 UNIT/ML FlexTouch Pen Inject 60 Units into the skin at bedtime. (Patient taking differently: Inject 60 Units into the skin at bedtime. 60 units daily as divided doses with meals) 54 mL 1   insulin lispro (HUMALOG) 100 UNIT/ML injection Inject 5 Units into the skin 3 (three) times daily before meals.     isosorbide mononitrate (IMDUR) 30 MG 24 hr tablet Take 0.5 tablets (15 mg total) by mouth daily. 30 tablet 6   losartan (COZAAR) 25  MG tablet TAKE 1 TABLET(25 MG) BY MOUTH DAILY 90 tablet 3   metoprolol succinate (TOPROL-XL) 25 MG 24 hr tablet TAKE 1 TABLET(25 MG) BY MOUTH DAILY (Patient taking differently: daily as needed.) 30 tablet 5   Multiple Vitamin (MULTIVITAMIN ADULT) TABS Take 1 tablet by mouth in the morning and at bedtime.     nitroGLYCERIN (NITROSTAT) 0.4 MG SL tablet Place 1 tablet (0.4 mg total) under the tongue every 5 (five) minutes as needed for chest pain. 25 tablet PRN   rosuvastatin (CRESTOR) 20 MG tablet Take 1 tablet (20 mg total) by mouth every evening. 90 tablet 4   spironolactone (ALDACTONE) 25 MG tablet Take 0.5 tablets (12.5 mg total) by mouth daily. 90 tablet 3   SYNTHROID 75 MCG tablet TAKE ONE TABLET BY MOUTH Monday-Saturday BEFORE breakfast 80 tablet 4   topiramate (TOPAMAX) 50 MG tablet Take 1 tablet (50 mg total) by mouth 2 (two) times daily. (Patient taking differently: Take 50 mg by mouth as needed.) 60 tablet 12   torsemide (DEMADEX) 20 MG tablet Take 2 tablets (40 mg total) by mouth daily. 60 tablet 5   No current facility-administered medications for this visit.    REVIEW OF SYSTEMS:  [X]  denotes positive finding, [ ]  denotes negative finding Cardiac  Comments:  Chest pain or chest pressure:    Shortness of breath upon exertion:    Short of breath when lying flat:    Irregular heart rhythm:        Vascular  Pain in calf, thigh, or hip brought on by ambulation:    Pain in feet at night that wakes you up from your sleep:     Blood clot in your veins:    Leg swelling:         Pulmonary    Oxygen at home:    Productive cough:     Wheezing:         Neurologic    Sudden weakness in arms or legs:     Sudden numbness in arms or legs:     Sudden onset of difficulty speaking or slurred speech:    Temporary loss of vision in one eye:     Problems with dizziness:         Gastrointestinal    Blood in stool:     Vomited blood:         Genitourinary    Burning when urinating:      Blood in urine:        Psychiatric    Major depression:         Hematologic    Bleeding problems:    Problems with blood clotting too easily:        Skin    Rashes or ulcers:        Constitutional    Fever or chills:      PHYSICAL EXAM: There were no vitals filed for this visit.   GENERAL: The patient is a well-nourished female, in no acute distress. The vital signs are documented above. CARDIAC: There is a regular rate and rhythm.  VASCULAR:  Left neck incision c/d/i PULMONARY: No respiratory distress. NEUROLOGIC: No focal weakness or paresthesias are detected.  CN II-XII grossly intact.  DATA:   Carotid duplex today shows widely patent left carotid stent with minimal contralateral disease in the right ICA in the 1-39% range  Assessment/Plan:  82 year-old female that presents for 1 year follow-up for surveillance of her left carotid stent after left TCAR on 07/23/2018 for asymptomatic high-grade stenosis.  Discussed that her left carotid stent is widely patent on duplex today.  She has no significant contralateral disease with 1-39% stenosis.  I am unclear about the event that she had in June that sounds like could be amaurosis.  That being said there was no abnormality noted by her ophthalmologist and her MRI brain was negative.  Her duplex today shows that her left carotid stent is widely patent with minimal contralateral disease.  She is on dual antiplatelet therapy.  I see no indication for further intervention from our standpoint.  Will arrange follow-up in 1 year for with carotid duplex.  She is also concerned about a family history of abdominal aneurysms.  I was able to review a CT abdomen from 2022 in our system that shows no evidence of abdominal aortic aneurysm.  I provided this reassurance to her today.   Cephus Shelling, MD [facility] Office: [phone]   Cephus Shelling

## 2023-05-09 ENCOUNTER — Encounter: Payer: Self-pay | Admitting: Vascular Surgery

## 2023-05-09 ENCOUNTER — Ambulatory Visit: Payer: PPO | Admitting: Vascular Surgery

## 2023-05-09 ENCOUNTER — Ambulatory Visit (HOSPITAL_COMMUNITY)
Admission: RE | Admit: 2023-05-09 | Discharge: 2023-05-09 | Disposition: A | Discharge status: Home / Self Care | Payer: PPO | Source: Ambulatory Visit | Attending: Vascular Surgery | Admitting: Vascular Surgery

## 2023-05-09 VITALS — BP 142/76 | Temp 98.4°F | Resp 16 | Ht 63.0 in | Wt 156.0 lb

## 2023-05-09 DIAGNOSIS — H543 Unqualified visual loss, both eyes: Secondary | ICD-10-CM | POA: Diagnosis not present

## 2023-05-09 DIAGNOSIS — I6522 Occlusion and stenosis of left carotid artery: Secondary | ICD-10-CM

## 2023-05-11 ENCOUNTER — Encounter (INDEPENDENT_AMBULATORY_CARE_PROVIDER_SITE_OTHER): Payer: Self-pay

## 2023-05-12 ENCOUNTER — Other Ambulatory Visit: Payer: Self-pay

## 2023-05-12 DIAGNOSIS — I6522 Occlusion and stenosis of left carotid artery: Secondary | ICD-10-CM

## 2023-05-18 ENCOUNTER — Encounter: Payer: PPO | Admitting: Family

## 2023-05-30 ENCOUNTER — Encounter: Payer: Self-pay | Admitting: Pulmonary Disease

## 2023-05-30 ENCOUNTER — Ambulatory Visit: Payer: PPO | Admitting: Pulmonary Disease

## 2023-05-30 VITALS — BP 134/78 | HR 67 | Temp 97.6°F | Ht 63.0 in | Wt 157.0 lb

## 2023-05-30 DIAGNOSIS — J4530 Mild persistent asthma, uncomplicated: Secondary | ICD-10-CM | POA: Diagnosis not present

## 2023-05-30 DIAGNOSIS — G4733 Obstructive sleep apnea (adult) (pediatric): Secondary | ICD-10-CM

## 2023-05-30 DIAGNOSIS — I201 Angina pectoris with documented spasm: Secondary | ICD-10-CM | POA: Diagnosis not present

## 2023-05-30 DIAGNOSIS — R0602 Shortness of breath: Secondary | ICD-10-CM | POA: Diagnosis not present

## 2023-05-30 DIAGNOSIS — R499 Unspecified voice and resonance disorder: Secondary | ICD-10-CM

## 2023-05-30 MED ORDER — ALBUTEROL SULFATE HFA 108 (90 BASE) MCG/ACT IN AERS: 2.0000 | INHALATION_SPRAY | Freq: Four times a day (QID) | RESPIRATORY_TRACT | 2 refills | Status: DC | PRN

## 2023-05-30 NOTE — Patient Instructions (Addendum)
Continue taking your Trelegy.  Make sure you rinse your mouth well after you use it.  I have refilled the albuterol, the prescription was sent to your pharmacy.  Check with ear nose and throat (ENT) with regards to your hoarseness.  It may be that voice therapy may help.  Check with Cassandra Kindred, NP at the heart failure clinic to see if a program such as Silver sneakers or water aerobics may be of benefit to you.  We will see you in follow-up in 3 months time call sooner should any new problems arise.

## 2023-05-30 NOTE — Progress Notes (Signed)
Subjective:    Patient ID: Cassandra Benson, female    DOB: 1940-12-31, 82 y.o.   MRN: [id number]  Patient Care Team: Eustaquio Boyden, MD as PCP - General (Family Medicine) Antonieta Iba, MD as PCP - Cardiology (Cardiology) Galen Manila, MD as Referring Physician (Ophthalmology)  Chief Complaint  Patient presents with   Follow-up    SOB with exertion and when she lays down flat. Dry cough. Occasional wheezing.     HPI Patient is an 82 year old lifelong never smoker with a history as noted below, who presents for follow-up on the issue of dyspnea.  Last seen on 26 Jan 2023.  Last visit to the ED was 14 March 2023 at that time she was noted to have chest heaviness and pressure with difficulty breathing.  She had a negative cardiac workup at that time.   Today she presents stating that she is doing better with regards to shortness of breath.  She notes that Trelegy helps her.  She has mild persistent asthma without recent exacerbation.  She complains of waxing and waning hoarseness that has plagued her for a number of years.  She has previously been evaluated by ENT for this issue.  I recommended that she revisit with them.  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  We have discussed this today.  She has not had any recent orthopnea, paroxysmal nocturnal dyspnea nor calf tenderness.  She does note that she gets lower extremity edema from time to time.  She follows with the heart failure clinic for diastolic dysfunction.  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  Does not endorse any other complaint today.   DATA 12/24/2021 echocardiogram: LVEF 60 to 65%, grade 1 DD, normal right ventricular systolic function.  Normal pulmonary artery systolic pressure.  Mild MR. 04/11/2022 CT chest high-resolution: Mild pulmonary fibrosis pattern with apical to basal gradient, no significant air trapping on expiratory phase.  Not consistent with UIP.  Findings are very mild. 04/22/2022  PFTs: FEV1 1.91 L or 101% predicted, FVC 2.98 L or 117% predicted, FEV1/FVC 64%.  No bronchodilator response, lung volumes normal.  Mild diffusion capacity impairment.  Consistent with mild obstructive airways disease. 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 08/01/2022 connective tissue disease panel, acetylcholine receptor assay: Negative (notably ANA negative). 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  Patient declined CPAP/oxygen. 12/02/2022 right and left heart cath: 50% stenosis of the ostium of the RCA with pressure dampening.  Improvement with intracoronary nitroglycerin jesting component of vasospasm.  No angiographically significant coronary artery disease noted in the left coronary artery.  Normal left and right heart filling pressures.  Significant vasospasm noted on the right radial artery. 12/23/2022 echocardiogram: LVEF 60 to 65%, normal LV function, grade 1 DD, no aortic or mitral valve dysfunction. 01/13/2023 chest x-ray PA and lateral: No active pulmonary disease. 01/13/2023 VQ scan: Negative for pulmonary emboli. 01/13/2023 lower extremity Dopplers: No evidence of deep venous thrombosis in either lower extremity.  Review of Systems A 10 point review of systems was performed and it is as noted above otherwise negative.   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   Chronic diastolic heart failure (HCC) 12/02/2022   Allergy to beta blocker 08/06/2022   Steroid-induced hyperglycemia 05/16/2022   Right thigh pain 05/16/2022   Dysuria  02/25/2022   Body aches 02/15/2022   Insomnia 02/01/2022   Osteopenia 01/15/2022   Chronic dyspnea 12/22/2021   Unsteadiness 02/13/2021   Atherosclerosis of aorta (HCC) 12/16/2020   Neck pain 12/03/2020   Fall with injury 09/01/2020   Grade II  diastolic dysfunction 08/01/2020   Chronic venous insufficiency 04/07/2020   Partial small bowel obstruction (HCC) 01/15/2020   Epistaxis 01/13/2020   Cognitive changes 10/22/2019   Right sided abdominal pain 10/07/2019   Skin lesion 09/14/2019   Renal insufficiency 09/14/2019   Pain in right buttock 06/18/2019   Chronic midline thoracic back pain 05/14/2019   Sjogren's syndrome without extraglandular involvement (HCC) 02/26/2019   Epigastric discomfort 12/17/2018   PAD (peripheral artery disease) (HCC) 10/04/2018   Raynaud disease 09/04/2018   Amaurosis fugax, both eyes 06/18/2018   TIA (transient ischemic attack) 05/21/2018   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   Facial paresthesia 02/07/2018   Pain and swelling of lower leg 12/25/2017   Fibromyalgia    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), bilateral 08/21/2017   Chronic right-sided headache 03/13/2017   Numbness and tingling of both lower extremities 03/13/2017   6th nerve palsy, 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   Fatty liver 02/13/2017   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) 10/03/2016   Acute pain of right shoulder 08/15/2016   Medicare annual wellness visit, subsequent 06/19/2015   Health maintenance examination 06/19/2015   Advanced care planning/counseling discussion 06/19/2015   Carotid stenosis, 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 06/19/2015   Vitamin D deficiency    Family history of premature CAD 06/02/2015   Chest pain 05/26/2015   Elevated testosterone level in female 09/04/2014   Hyperlipidemia associated with type 2 diabetes mellitus (HCC) 07/30/2014   Essential hypertension 07/17/2014   Hypothyroidism due to Hashimoto's thyroiditis 07/17/2014   OSA (obstructive sleep apnea) 08/22/2012   Chronic low back pain 02/27/2012   Positive ANA (antinuclear antibody) 01/06/2012    Social History   Tobacco Use   Smoking status: Never   Smokeless tobacco: Never  Substance Use  Topics   Alcohol use: No    Allergies  Allergen Reactions   Iodinated Contrast Media Other (See Comments)    Itching (severe) and chest tightness   Penicillins Anaphylaxis, Swelling, Rash and Other (See Comments)    Has patient had a PCN reaction causing immediate rash, facial/tongue/throat swelling, SOB or lightheadedness with hypotension: Yes Has patient had a PCN reaction causing severe rash involving mucus membranes or skin necrosis: yes - remotely Has patient had a PCN reaction that required hospitalization: occurred while hospitalized Has patient had a PCN reaction occurring within the last 10 years: No If all of the above answers are "NO", then may proceed with Cephalosporin use.    Amlodipine Swelling    Pedal edema   Anectine [Succinylcholine] Other (See Comments)    Brother went into cardiac arrest.   Carvedilol Dermatitis    Facial acne   Codeine Nausea Only   Gabapentin Other (See Comments)    Gait abnormality   Influenza Vaccines Other (See Comments)    Muscle weakness; unable to walk   Insulin Aspart Other (See Comments)    headache   Nortriptyline Other (See Comments)    Eye swelling and mouth drawed up   Prednisone     Marked severe hyperglycemia to oral and IM steroids   Valsartan Other (See Comments) and Cough    Allergy to generic only, "Hacking" cough   Zetia [Ezetimibe] Other (See Comments)  Bad muscle cramps   Erythromycin Rash and Swelling   Sulfa Antibiotics Rash    Current Meds  Medication Sig   acetaminophen (TYLENOL) 500 MG tablet Take 1,500 mg by mouth daily.   albuterol (VENTOLIN HFA) 108 (90 Base) MCG/ACT inhaler Inhale 2 puffs into the lungs every 6 (six) hours as needed.   Ascorbic Acid (VITAMIN C) 100 MG tablet Take 100 mg by mouth daily.   aspirin EC 81 MG tablet Take 1 tablet (81 mg total) by mouth daily. Swallow whole.   Baclofen 5 MG TABS Take 1-2 tablets (5-10 mg total) by mouth at bedtime as needed (neck pain).   Cholecalciferol  (VITAMIN D3) 25 MCG (1000 UT) CAPS Take 2 capsules (2,000 Units total) by mouth daily.   clopidogrel (PLAVIX) 75 MG tablet Take 1 tablet (75 mg total) by mouth every evening.   diclofenac Sodium (VOLTAREN) 1 % GEL Apply 2 g topically 3 (three) times daily.   Fluticasone-Umeclidin-Vilant (TRELEGY ELLIPTA) 100-62.5-25 MCG/ACT AEPB Inhale 1 puff into the lungs daily.   Glucagon, rDNA, (GLUCAGON EMERGENCY) 1 MG KIT INJECT into THE muscle ONCE AS NEEDED FOR emergency   insulin degludec (TRESIBA FLEXTOUCH) 100 UNIT/ML FlexTouch Pen Inject 60 Units into the skin at bedtime. (Patient taking differently: Inject 60 Units into the skin at bedtime. 60 units daily as divided doses with meals)   insulin lispro (HUMALOG) 100 UNIT/ML injection Inject 5 Units into the skin 3 (three) times daily before meals.   isosorbide mononitrate (IMDUR) 30 MG 24 hr tablet Take 0.5 tablets (15 mg total) by mouth daily.   losartan (COZAAR) 25 MG tablet TAKE 1 TABLET(25 MG) BY MOUTH DAILY   metoprolol succinate (TOPROL-XL) 25 MG 24 hr tablet TAKE 1 TABLET(25 MG) BY MOUTH DAILY (Patient taking differently: daily as needed.)   Multiple Vitamin (MULTIVITAMIN ADULT) TABS Take 1 tablet by mouth in the morning and at bedtime.   nitroGLYCERIN (NITROSTAT) 0.4 MG SL tablet Place 1 tablet (0.4 mg total) under the tongue every 5 (five) minutes as needed for chest pain.   rosuvastatin (CRESTOR) 20 MG tablet Take 1 tablet (20 mg total) by mouth every evening.   spironolactone (ALDACTONE) 25 MG tablet Take 0.5 tablets (12.5 mg total) by mouth daily.   SYNTHROID 75 MCG tablet TAKE ONE TABLET BY MOUTH Monday-Saturday BEFORE breakfast   topiramate (TOPAMAX) 50 MG tablet Take 1 tablet (50 mg total) by mouth 2 (two) times daily. (Patient taking differently: Take 50 mg by mouth as needed.)   torsemide (DEMADEX) 20 MG tablet Take 2 tablets (40 mg total) by mouth daily.    Immunization History  Administered Date(s) Administered   Pneumococcal  Conjugate-13 09/23/2014   Pneumococcal Polysaccharide-23 01/05/2010, 07/17/2013   Tdap 10/07/2011      Objective:     BP 134/78 (BP Location: Right Arm, Cuff Size: Normal)   Pulse 67   Temp 97.6 F (36.4 C)   Ht 5\' 3"  (1.6 m)   Wt 157 lb (71.2 kg)   SpO2 98%   BMI 27.81 kg/m   SpO2: 98 % O2 Device: None (Room air)  GENERAL: Well-developed, well-nourished woman, no acute distress.  Fully ambulatory with no conversational dyspnea.  Nasal quality to speech.  No dysphonia noted today. HEAD: Normocephalic, atraumatic.  EYES: Pupils equal, round, reactive to light.  No scleral icterus.  MOUTH: Oral mucosa moist.  No thrush. NECK: Supple. No thyromegaly. Trachea midline. No JVD.  No adenopathy. PULMONARY: Good air entry bilaterally.  No adventitious  sounds. CARDIOVASCULAR: S1 and S2. Regular rate and rhythm.  No rubs, murmurs or gallops heard. ABDOMEN: Protuberant, otherwise benign. MUSCULOSKELETAL: No joint deformity, no clubbing, 1+ edema of lower extremities.  NEUROLOGIC: No overt focal deficit, no gait disturbance, speech is fluent. SKIN: Intact,warm,dry.  No overt rashes, exam limited. PSYCH: Behavior normal    Assessment & Plan:     ICD-10-CM   1. Mild persistent asthma without complication  J45.30    Well-controlled Continue Trelegy 100 Continue as needed albuterol    2. Shortness of breath  R06.02    Has not worsened Persistent issue on exertion Out of proportion to mild asthma    3. Coronary artery vasospasm (HCC)  I20.1    This issue adds complexity to her management Patient follows with cardiology    4. OSA (obstructive sleep apnea)  G47.33    Mild OSA: AHI of 8.5 Oxygen desats to 82% nocturnally Patient declined CPAP/oxygen    5. Hoarseness or changing voice  R49.9    Chronic problem Query presbyphonia Recommend reevaluation by ENT     Meds ordered this encounter  Medications   albuterol (VENTOLIN HFA) 108 (90 Base) MCG/ACT inhaler    Sig: Inhale  2 puffs into the lungs every 6 (six) hours as needed.    Dispense:  8 g    Refill:  2   Will see the patient in follow-up in 3 months time she is to contact us prior to that time should any new difficulties arise.   Gailen Shelter, MD Advanced Bronchoscopy PCCM [facility]    *This note was dictated using voice recognition software/Dragon.  Despite best efforts to proofread, errors can occur which can change the meaning. Any transcriptional errors that result from this process are unintentional and may not be fully corrected at the time of dictation.

## 2023-06-06 ENCOUNTER — Ambulatory Visit (INDEPENDENT_AMBULATORY_CARE_PROVIDER_SITE_OTHER): Payer: PPO | Admitting: Family Medicine

## 2023-06-06 ENCOUNTER — Encounter: Payer: Self-pay | Admitting: Family Medicine

## 2023-06-06 VITALS — BP 126/70 | HR 90 | Temp 97.4°F | Ht 63.0 in | Wt 156.1 lb

## 2023-06-06 DIAGNOSIS — E13319 Other specified diabetes mellitus with unspecified diabetic retinopathy without macular edema: Secondary | ICD-10-CM | POA: Diagnosis not present

## 2023-06-06 DIAGNOSIS — I201 Angina pectoris with documented spasm: Secondary | ICD-10-CM

## 2023-06-06 DIAGNOSIS — R079 Chest pain, unspecified: Secondary | ICD-10-CM

## 2023-06-06 LAB — POCT GLYCOSYLATED HEMOGLOBIN (HGB A1C): Hemoglobin A1C: 10 % — AB (ref 4.0–5.6)

## 2023-06-06 NOTE — Patient Instructions (Addendum)
Call and schedule follow up appointment with endocrinology Dr Dell Ponto at [phone]   Keep sugar log 1 week prior to bring to appointment, jot down also when you take insulin and which one.  Call cardiac rehab to schedule appointment [phone] you should have order in chart.  Return to see me in 2-3 months.

## 2023-06-06 NOTE — Progress Notes (Unsigned)
Ph: [phone] Fax: [phone]   Patient ID: Cassandra Benson, female    DOB: 1941/06/07, 82 y.o.   MRN: [id number]  This visit was conducted in person.  BP 126/70   Pulse 90   Temp (!) 97.4 F (36.3 C) (Temporal)   Ht 5\' 3"  (1.6 m)   Wt 156 lb 2 oz (70.8 kg)   SpO2 95%   BMI 27.66 kg/m    CC: follow up visit, 4 months Subjective:   HPI: Cassandra Benson is a 82 y.o. female presenting on 06/06/2023 for Medical Management of Chronic Issues (Here for 4 mo f/u.)   Followed by cardiology Mariah Milling) and CHF clinic Clarisa Kindred NP). Cardiac CTA without significant disease. Likely coronary spasms causing chest pain. 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 Continues imdur 30mg  1/2 tablet daily. Has been referred to cardiac rehab. Reassuring echocardiogram 11/2022.   Mild OSA followed by pulm Jayme Cloud) seen last week - she declined CPAP/O2. Asthma - well controlled on Trelegy 100 1 puff once daily and PRN albuterol inhaler. She suggested re-eval by ENT for chronic hoarseness ?presbyphonia.   Carotid stenosis s/p L stent placement. Episode in June of possible amaurosis fugax however carotid US reassuring and brain MRI normal. No further episodes   LADA - continues seeing [facility] endocrinology Dr Dell Ponto last seen 12/2022. She continued tresiba 60u nightly as well as started humalog insulin 5u with meals (only taking this with breakfast). Overdue for endo f/u. Cbg this morning was 300. She states she takes tresiba 60 units twice daily when sugars go up, and this morning took 75 units.   Last eye exam 03/2023 Diabetic Foot Exam - Simple   Simple Foot Form Diabetic Foot exam was performed with the following findings: Yes 06/06/2023 10:31 AM  Visual Inspection No deformities, no ulcerations, no other skin breakdown bilaterally: Yes Sensation Testing See comments: Yes Pulse Check Posterior Tibialis and Dorsalis pulse intact bilaterally: Yes Comments No claudication Mildly  diminished to monofilament bilateral soles        Relevant past medical, surgical, family and social history reviewed and updated as indicated. Interim medical history since our last visit reviewed. Allergies and medications reviewed and updated. Outpatient Medications Prior to Visit  Medication Sig Dispense Refill   acetaminophen (TYLENOL) 500 MG tablet Take 1,500 mg by mouth daily.     albuterol (VENTOLIN HFA) 108 (90 Base) MCG/ACT inhaler Inhale 2 puffs into the lungs every 6 (six) hours as needed. 8 g 2   Ascorbic Acid (VITAMIN C) 100 MG tablet Take 100 mg by mouth daily.     aspirin EC 81 MG tablet Take 1 tablet (81 mg total) by mouth daily. Swallow whole. 30 tablet 12   Baclofen 5 MG TABS Take 1-2 tablets (5-10 mg total) by mouth at bedtime as needed (neck pain). 30 tablet 3   Cholecalciferol (VITAMIN D3) 25 MCG (1000 UT) CAPS Take 2 capsules (2,000 Units total) by mouth daily.     clopidogrel (PLAVIX) 75 MG tablet Take 1 tablet (75 mg total) by mouth every evening. 30 tablet 2   diclofenac Sodium (VOLTAREN) 1 % GEL Apply 2 g topically 3 (three) times daily. 100 g 1   Fluticasone-Umeclidin-Vilant (TRELEGY ELLIPTA) 100-62.5-25 MCG/ACT AEPB Inhale 1 puff into the lungs daily. 28 each 11   Glucagon, rDNA, (GLUCAGON EMERGENCY) 1 MG KIT INJECT into THE muscle ONCE AS NEEDED FOR emergency 1 kit 12   insulin degludec (TRESIBA FLEXTOUCH) 100 UNIT/ML FlexTouch Pen Inject  60 Units into the skin at bedtime. (Patient taking differently: Inject 60 Units into the skin at bedtime. 60 units daily as divided doses with meals) 54 mL 1   insulin lispro (HUMALOG) 100 UNIT/ML injection Inject 5 Units into the skin 3 (three) times daily before meals.     isosorbide mononitrate (IMDUR) 30 MG 24 hr tablet Take 0.5 tablets (15 mg total) by mouth daily. 30 tablet 6   losartan (COZAAR) 25 MG tablet TAKE 1 TABLET(25 MG) BY MOUTH DAILY 90 tablet 3   metoprolol succinate (TOPROL-XL) 25 MG 24 hr tablet TAKE 1  TABLET(25 MG) BY MOUTH DAILY (Patient taking differently: daily as needed.) 30 tablet 5   Multiple Vitamin (MULTIVITAMIN ADULT) TABS Take 1 tablet by mouth in the morning and at bedtime.     nitroGLYCERIN (NITROSTAT) 0.4 MG SL tablet Place 1 tablet (0.4 mg total) under the tongue every 5 (five) minutes as needed for chest pain. 25 tablet PRN   rosuvastatin (CRESTOR) 20 MG tablet Take 1 tablet (20 mg total) by mouth every evening. 90 tablet 4   SYNTHROID 75 MCG tablet TAKE ONE TABLET BY MOUTH Monday-Saturday BEFORE breakfast 80 tablet 4   topiramate (TOPAMAX) 50 MG tablet Take 1 tablet (50 mg total) by mouth 2 (two) times daily. (Patient taking differently: Take 50 mg by mouth as needed.) 60 tablet 12   torsemide (DEMADEX) 20 MG tablet Take 2 tablets (40 mg total) by mouth daily. 60 tablet 5   spironolactone (ALDACTONE) 25 MG tablet Take 0.5 tablets (12.5 mg total) by mouth daily. 90 tablet 3   No facility-administered medications prior to visit.     Per HPI unless specifically indicated in ROS section below Review of Systems  Objective:  BP 126/70   Pulse 90   Temp (!) 97.4 F (36.3 C) (Temporal)   Ht 5\' 3"  (1.6 m)   Wt 156 lb 2 oz (70.8 kg)   SpO2 95%   BMI 27.66 kg/m   Wt Readings from Last 3 Encounters:  06/06/23 156 lb 2 oz (70.8 kg)  05/30/23 157 lb (71.2 kg)  05/09/23 156 lb (70.8 kg)      Physical Exam Vitals and nursing note reviewed.  Constitutional:      Appearance: Normal appearance. She is not ill-appearing.  HENT:     Mouth/Throat:     Mouth: Mucous membranes are moist.     Pharynx: Oropharynx is clear. No oropharyngeal exudate or posterior oropharyngeal erythema.  Eyes:     Extraocular Movements: Extraocular movements intact.     Conjunctiva/sclera: Conjunctivae normal.     Pupils: Pupils are equal, round, and reactive to light.  Cardiovascular:     Rate and Rhythm: Normal rate and regular rhythm.     Pulses: Normal pulses.     Heart sounds: Normal heart  sounds. No murmur heard. Pulmonary:     Effort: Pulmonary effort is normal. No respiratory distress.     Breath sounds: Normal breath sounds. No wheezing, rhonchi or rales.  Musculoskeletal:     Right lower leg: No edema.     Left lower leg: No edema.     Comments: See HPI for foot exam if done  Skin:    General: Skin is warm and dry.     Findings: No rash.  Neurological:     Mental Status: She is alert.  Psychiatric:        Mood and Affect: Mood normal.        Behavior:  Behavior normal.       Results for orders placed or performed in visit on 06/06/23  POCT glycosylated hemoglobin (Hb A1C)  Result Value Ref Range   Hemoglobin A1C 10.0 (A) 4.0 - 5.6 %   HbA1c POC (<> result, manual entry)     HbA1c, POC (prediabetic range)     HbA1c, POC (controlled diabetic range)     *Note: Due to a large number of results and/or encounters for the requested time period, some results have not been displayed. A complete set of results can be found in Results Review.   Lab Results  Component Value Date   NA 137 03/14/2023   CL 106 03/14/2023   K 3.9 03/14/2023   CO2 22 03/14/2023   BUN 16 03/14/2023   CREATININE 0.83 03/14/2023   GFRNONAA >60 03/14/2023   CALCIUM 9.3 03/14/2023   ALBUMIN 3.4 (L) 01/13/2023   GLUCOSE 229 (H) 03/14/2023    Lab Results  Component Value Date   WBC 8.5 03/14/2023   HGB 13.7 03/14/2023   HCT 42.8 03/14/2023   MCV 84.8 03/14/2023   PLT 212 03/14/2023   Assessment & Plan:   Problem List Items Addressed This Visit     Chest pain    Thought due to coronary spasm, now on imdur 30mg  1/2 tab daily. See above.       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 Notes ongoing difficulty with hyperglycemia, without hypoglycemia.  She continues humalog 5u with breakfast only  She is prescribed tresiba 60u daily and takes this at night, however has been taking tresiba in  different ways - sometimes 75u daily, sometimes 60u twice daily when sugars are very high.  Overdue for endocrinology f/u - # provided to call and schedule appointment.  Foot exam today       Relevant Orders   POCT glycosylated hemoglobin (Hb A1C) (Completed)   Coronary artery vasospasm (HCC)    Continue imdur 30mg  1/2 tab daily.  Was referred for cardiac rehab but states difficulty reaching them to schedule appt. Advised try again and let me know in 1-2 wks if not contacted by rehab team.         No orders of the defined types were placed in this encounter.   Orders Placed This Encounter  Procedures   POCT glycosylated hemoglobin (Hb A1C)    Patient Instructions  Call and schedule follow up appointment with endocrinology Dr Dell Ponto at [phone]   Keep sugar log 1 week prior to bring to appointment, jot down also when you take insulin and which one.  Call cardiac rehab to schedule appointment [phone] you should have order in chart.  Return to see me in 2-3 months.   Follow up plan: Return in about 3 months (around 09/05/2023), or if symptoms worsen or fail to improve, for follow up visit.  Eustaquio Boyden, MD

## 2023-06-07 NOTE — Assessment & Plan Note (Addendum)
Thought due to coronary spasm, now on imdur 30mg  1/2 tab daily. See above.

## 2023-06-07 NOTE — Assessment & Plan Note (Addendum)
Chronic, deteriorated control. Notes ongoing difficulty with hyperglycemia, without hypoglycemia.  She continues humalog 5u with breakfast only  She is prescribed tresiba 60u daily and takes this at night, however has been taking tresiba in different ways - sometimes 75u daily, sometimes 60u twice daily when sugars are very high.  Overdue for endocrinology f/u - # provided to call and schedule appointment.  Foot exam today

## 2023-06-07 NOTE — Assessment & Plan Note (Addendum)
Continue imdur 30mg  1/2 tab daily.  Was referred for cardiac rehab but states difficulty reaching them to schedule appt. Advised try again and let me know in 1-2 wks if not contacted by rehab team.

## 2023-06-12 DIAGNOSIS — E1065 Type 1 diabetes mellitus with hyperglycemia: Secondary | ICD-10-CM | POA: Diagnosis not present

## 2023-06-12 DIAGNOSIS — E13319 Other specified diabetes mellitus with unspecified diabetic retinopathy without macular edema: Secondary | ICD-10-CM | POA: Diagnosis not present

## 2023-06-13 ENCOUNTER — Encounter: Payer: Self-pay | Admitting: Family Medicine

## 2023-06-19 DIAGNOSIS — E109 Type 1 diabetes mellitus without complications: Secondary | ICD-10-CM | POA: Diagnosis not present

## 2023-06-19 DIAGNOSIS — I509 Heart failure, unspecified: Secondary | ICD-10-CM | POA: Diagnosis not present

## 2023-06-19 DIAGNOSIS — Z7951 Long term (current) use of inhaled steroids: Secondary | ICD-10-CM | POA: Diagnosis not present

## 2023-06-19 DIAGNOSIS — J4530 Mild persistent asthma, uncomplicated: Secondary | ICD-10-CM | POA: Diagnosis not present

## 2023-06-19 DIAGNOSIS — J84112 Idiopathic pulmonary fibrosis: Secondary | ICD-10-CM | POA: Diagnosis not present

## 2023-06-20 ENCOUNTER — Encounter: Payer: Self-pay | Admitting: Family

## 2023-06-20 ENCOUNTER — Encounter: Payer: Self-pay | Admitting: Pharmacy Technician

## 2023-06-20 ENCOUNTER — Ambulatory Visit: Payer: PPO | Attending: Family | Admitting: Family

## 2023-06-20 ENCOUNTER — Telehealth: Payer: Self-pay | Admitting: Family

## 2023-06-20 VITALS — BP 126/64 | HR 74 | Wt 155.2 lb

## 2023-06-20 DIAGNOSIS — E039 Hypothyroidism, unspecified: Secondary | ICD-10-CM | POA: Diagnosis not present

## 2023-06-20 DIAGNOSIS — G4733 Obstructive sleep apnea (adult) (pediatric): Secondary | ICD-10-CM | POA: Insufficient documentation

## 2023-06-20 DIAGNOSIS — I509 Heart failure, unspecified: Secondary | ICD-10-CM | POA: Diagnosis not present

## 2023-06-20 DIAGNOSIS — R109 Unspecified abdominal pain: Secondary | ICD-10-CM | POA: Insufficient documentation

## 2023-06-20 DIAGNOSIS — E139 Other specified diabetes mellitus without complications: Secondary | ICD-10-CM

## 2023-06-20 DIAGNOSIS — R059 Cough, unspecified: Secondary | ICD-10-CM | POA: Insufficient documentation

## 2023-06-20 DIAGNOSIS — E785 Hyperlipidemia, unspecified: Secondary | ICD-10-CM | POA: Insufficient documentation

## 2023-06-20 DIAGNOSIS — R0602 Shortness of breath: Secondary | ICD-10-CM | POA: Diagnosis not present

## 2023-06-20 DIAGNOSIS — R42 Dizziness and giddiness: Secondary | ICD-10-CM | POA: Insufficient documentation

## 2023-06-20 DIAGNOSIS — Z79899 Other long term (current) drug therapy: Secondary | ICD-10-CM | POA: Diagnosis not present

## 2023-06-20 DIAGNOSIS — I110 Hypertensive heart disease with heart failure: Secondary | ICD-10-CM | POA: Insufficient documentation

## 2023-06-20 DIAGNOSIS — I5032 Chronic diastolic (congestive) heart failure: Secondary | ICD-10-CM

## 2023-06-20 DIAGNOSIS — E138 Other specified diabetes mellitus with unspecified complications: Secondary | ICD-10-CM | POA: Insufficient documentation

## 2023-06-20 DIAGNOSIS — R002 Palpitations: Secondary | ICD-10-CM | POA: Diagnosis not present

## 2023-06-20 DIAGNOSIS — I10 Essential (primary) hypertension: Secondary | ICD-10-CM | POA: Diagnosis not present

## 2023-06-20 DIAGNOSIS — I428 Other cardiomyopathies: Secondary | ICD-10-CM | POA: Insufficient documentation

## 2023-06-20 DIAGNOSIS — M3500 Sicca syndrome, unspecified: Secondary | ICD-10-CM | POA: Insufficient documentation

## 2023-06-20 DIAGNOSIS — M797 Fibromyalgia: Secondary | ICD-10-CM | POA: Diagnosis not present

## 2023-06-20 NOTE — Consult Note (Signed)
[facility] - HEART FAILURE CLINIC - PHARMACIST COUNSELING NOTE  Guideline-Directed Medical Therapy/Evidence Based Medicine  ACE/ARB/ARNI: Losartan 25 mg daily Beta Blocker: Metoprolol succinate 25 mg daily PRN Aldosterone Antagonist:  None Diuretic: Torsemide 40 mg daily SGLT2i:  None  Adherence Assessment  Do you ever forget to take your medication? [] Yes [x] No  Do you ever skip doses due to side effects? [] Yes [x] No  Do you have trouble affording your medicines? [] Yes [x] No  Are you ever unable to pick up your medication due to transportation difficulties? [] Yes [x] No  Do you ever stop taking your medications because you don't believe they are helping? [] Yes [x] No  Do you check your weight daily? [x] Yes [] No   Adherence strategy: She is very knowledgeable about her medications and daughter-in-law and son help as needed, too  Barriers to obtaining medications: N/A  Vital signs: HR 74, BP 126/64, weight (pounds) 155 ECHO: Date 12/23/2022, EF 60-65%, notes grade I DD; RV size and function normal     Latest Ref Rng & Units 03/14/2023    9:15 AM 01/13/2023   11:53 AM 12/02/2022   12:29 PM  BMP  Glucose 70 - 99 mg/dL 132  440    BUN 8 - 23 mg/dL 16  12    Creatinine 1.02 - 1.00 mg/dL 7.25  3.66    Sodium 440 - 145 mmol/L 137  143  142   Potassium 3.5 - 5.1 mmol/L 3.9  3.3  3.6   Chloride 98 - 111 mmol/L 106  112    CO2 22 - 32 mmol/L 22  22    Calcium 8.9 - 10.3 mg/dL 9.3  9.1      Past Medical History:  Diagnosis Date   Acute diverticulitis 04/2021   [facility] ER, CT confirmed   Allergy    ANA positive    positive ANA pattern 1 speckled   Arthritis    Carotid stenosis, asymptomatic 06/19/2015   1-39% RICA 40-59% LICA rpt 1 yr (05/2015)    CHF (congestive heart failure) (HCC)    Colon polyps    COVID-19 virus infection 09/14/2021   Dermatomyositis (HCC)    Diabetes mellitus without complication (HCC)    Type 1   Diverticulosis    sigmoid on CT  scan 12/2019   Family history of adverse reaction to anesthesia    brothr went into cardiac arrest from anectine   Fibromyalgia    prior PCP   GERD (gastroesophageal reflux disease)    prior PCP   Glaucoma    Narrow angle   History of blood clots    DVT, in 20s, none since   History of chicken pox    History of diverticulitis    History of pericarditis 1986   with hospitalization   History of pneumonia 2014   History of shingles    History of UTI    Hyperlipidemia    Hypertension    Hypothyroidism    Mixed connective tissue disease (HCC)    Partial small bowel obstruction (HCC) 12/2019   managed conservatively   Peptic ulcer    Pneumonia    PONV (postoperative nausea and vomiting)    Raynaud's disease without gangrene    Shoulder pain left   h/o RTC tendonitis and adhesive capsulitis   Sigmoid diverticulitis 05/26/2021   Sjogren's syndrome (HCC)    Sleep apnea    prior PCP - no CPAP for about 10 yrs   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HCC)    Vitamin D  deficiency    prior PCP    ASSESSMENT 82 year old female who presents to the HF clinic for follow-up. She has no complaints and no issues today. Reports recent frustration with ED healthcare providers that she feels are dismissing her. No recent changes to medications.  Recent ED Visit (past 6 months): Date -  03/14/2023, CC - Chest pain  01/13/2023, CC - SOB  12/02/2022, CC - Chest pain  PLAN CHF / HTN Continue losartan 25 mg daily, metoprolol succinate 25 mg daily PRN, and torsemide 40 mg daily Not an Entresto candidate due to valsartan intolerance Not an SGLT2i candidate due to LADA CAD / HLD 10/28/2022 LDL 102 Continue rosuvastatin 20 mg daily Allergy to ezetimibe PCSK9i were unaffordable for her before but recommend consideration  LADA / T1DM 06/12/2023 A1c 9.7% Now follows with endocrinologist B[facility]le Fl[facility] at [facility] who will be managing her diabetes (formerly diagnosed as T2DM, newly diagnosed as LADA / T1DM) Continue insulin  degludec 56 units (split into multiple doses through the day)  Time spent: 45 minutes  Orson Aloe, Pharm.D. Clinical Pharmacist 06/20/2023 3:40 PM    Current Outpatient Medications:    acetaminophen (TYLENOL) 500 MG tablet, Take 1,000 mg by mouth daily., Disp: , Rfl:    albuterol (VENTOLIN HFA) 108 (90 Base) MCG/ACT inhaler, Inhale 2 puffs into the lungs every 6 (six) hours as needed., Disp: 8 g, Rfl: 2   Ascorbic Acid (VITAMIN C) 100 MG tablet, Take 100 mg by mouth daily., Disp: , Rfl:    aspirin EC 81 MG tablet, Take 1 tablet (81 mg total) by mouth daily. Swallow whole., Disp: 30 tablet, Rfl: 12   Baclofen 5 MG TABS, Take 1-2 tablets (5-10 mg total) by mouth at bedtime as needed (neck pain)., Disp: 30 tablet, Rfl: 3   Cholecalciferol (VITAMIN D3) 25 MCG (1000 UT) CAPS, Take 2 capsules (2,000 Units total) by mouth daily., Disp: , Rfl:    clopidogrel (PLAVIX) 75 MG tablet, Take 1 tablet (75 mg total) by mouth every evening., Disp: 30 tablet, Rfl: 2   diclofenac Sodium (VOLTAREN) 1 % GEL, Apply 2 g topically 3 (three) times daily., Disp: 100 g, Rfl: 1   Fluticasone-Umeclidin-Vilant (TRELEGY ELLIPTA) 100-62.5-25 MCG/ACT AEPB, Inhale 1 puff into the lungs daily., Disp: 28 each, Rfl: 11   Glucagon, rDNA, (GLUCAGON EMERGENCY) 1 MG KIT, INJECT into THE muscle ONCE AS NEEDED FOR emergency, Disp: 1 kit, Rfl: 12   insulin degludec (TRESIBA FLEXTOUCH) 100 UNIT/ML FlexTouch Pen, Inject 60 Units into the skin at bedtime. (Patient taking differently: Inject 56 Units into the skin at bedtime. 56 units daily as divided doses with meals), Disp: 54 mL, Rfl: 1   insulin lispro (HUMALOG) 100 UNIT/ML injection, Inject 5 Units into the skin 3 (three) times daily before meals., Disp: , Rfl:    isosorbide mononitrate (IMDUR) 30 MG 24 hr tablet, Take 0.5 tablets (15 mg total) by mouth daily., Disp: 30 tablet, Rfl: 6   losartan (COZAAR) 25 MG tablet, TAKE 1 TABLET(25 MG) BY MOUTH DAILY, Disp: 90 tablet, Rfl:  3   metoprolol succinate (TOPROL-XL) 25 MG 24 hr tablet, TAKE 1 TABLET(25 MG) BY MOUTH DAILY (Patient taking differently: 25 mg daily as needed.), Disp: 30 tablet, Rfl: 5   Multiple Vitamin (MULTIVITAMIN ADULT) TABS, Take 1 tablet by mouth in the morning and at bedtime., Disp: , Rfl:    nitroGLYCERIN (NITROSTAT) 0.4 MG SL tablet, Place 1 tablet (0.4 mg total) under the tongue every 5 (  five) minutes as needed for chest pain., Disp: 25 tablet, Rfl: PRN   rosuvastatin (CRESTOR) 20 MG tablet, Take 1 tablet (20 mg total) by mouth every evening., Disp: 90 tablet, Rfl: 4   SYNTHROID 75 MCG tablet, TAKE ONE TABLET BY MOUTH Monday-Saturday BEFORE breakfast, Disp: 80 tablet, Rfl: 4   topiramate (TOPAMAX) 50 MG tablet, Take 1 tablet (50 mg total) by mouth 2 (two) times daily. (Patient taking differently: Take 50 mg by mouth as needed.), Disp: 60 tablet, Rfl: 12   torsemide (DEMADEX) 20 MG tablet, Take 2 tablets (40 mg total) by mouth daily., Disp: 60 tablet, Rfl: 5

## 2023-06-20 NOTE — Progress Notes (Signed)
PCP: Eustaquio Boyden, MD (last seen 09/24) Primary Cardiologist: Julien Nordmann, MD (last seen 06/24) HF provider: Dorthula Nettles, MD (last seen 03/24)  HPI:  Cassandra Benson is a 82 yo female with a PMHx significant for HTN, HLD, hypothyroidism, CAD, LADA, HA's, fibromyalgia, PAD, vasospasm, Sjogren's syndrome, and CHF.   Was in the ED 11/16/22 due to SOB due to HF exacerbation. Was in the ED 01/13/23 due to SOB/ pedal edema. Elevated D-dimer. VQ scan negative for PE. Was in the ED 03/14/23 due to chest pain that woke her from sleep associated with chest pressure and heaviness in both sides of her chest. EKG, CXR and labs were unremarkable and she was released with improvement of pain.   Echo 07/30/20: EF of 55-60% with grade II diastolic dysfunction. Echo 12/24/21: EF of 60-65% along with mild MR.  Echo 12/23/22: EF 60-65% with Grade I DD.   RHC 12/02/22: No critical coronary artery disease. The is up to ~50% stenosis at the ostium of the RCA with pressure dampening noted using 58F diagnostic catheter.  Slight improvement noted with intracoronary nitroglycerin suggesting at least some component of vasospasm.  No angiographically significant coronary artery disease noted in the left coronary artery. Normal left and right heart filling pressures. Normal Fick cardiac output/index. Small right radial artery with significant vasospasm throughout the procedure.  Recommend alternate access for future catheterizations. Recommendations: Add isosorbide mononitrate 15 mg daily and as needed sublingual nitroglycerin for antianginal therapy, including possible coronary vasospasm.  She presents today for a HF f/u visit with a chief complaint of moderate SOB with minimal exertion. Chronic in nature. Has associated intermittent sharp chest pain, fatigue, palpitations (increasing frequency), dry cough, intermittent abdominal pain and intermittent dizziness along with this. Denies abdominal distention, pedal edema,  weight gain or difficulty sleeping.   Weight is stable. Does walk 3 blocks to mailroom and 3 blocks back to home.   At last visit, diuretic was changed to torsemide 40mg  daily & she feels like she is doing better since that change.  ROS: All systems negative except as listed in HPI, PMH and Problem List.  SH:  Social History   Socioeconomic History   Marital status: Widowed    Spouse name: Not on file   Number of children: 2   Years of education: Not on file   Highest education level: Not on file  Occupational History   Occupation: retired  Tobacco Use   Smoking status: Never   Smokeless tobacco: Never  Vaping Use   Vaping status: Never Used  Substance and Sexual Activity   Alcohol use: No   Drug use: No   Sexual activity: Not on file  Other Topics Concern   Not on file  Social History Narrative   Lives in [place], moved from [place].    Widow - husband decreased 01/2016 of metastatic colon CA   No pets.   Son Olufunke Blickenstaff lives nearby. Daughter lives in [place]. Sister lives 2 blocks away.    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, [employer]   Exercise - limited   Diet - good water, fruits/vegetables daily, limited meat, protein drink every morning   Social Determinants of Health   Financial Resource Strain: Low Risk  (09/27/2022)   Overall Financial Resource Strain (CARDIA)    Difficulty of Paying Living Expenses: Not hard at all  Food Insecurity: No Food Insecurity (09/27/2022)  Hunger Vital Sign    Worried About Running Out of Food in the Last Year: Never true    Ran Out of Food in the Last Year: Never true  Transportation Needs: No Transportation Needs (09/27/2022)   PRAPARE - Administrator, Civil Service (Medical): No    Lack of Transportation (Non-Medical): No  Physical Activity: Insufficiently Active (09/27/2022)   Exercise Vital Sign    Days of Exercise per Week: 3  days    Minutes of Exercise per Session: 30 min  Stress: No Stress Concern Present (09/27/2022)   [employer] of Occupational Health - Occupational Stress Questionnaire    Feeling of Stress : Not at all  Social Connections: Moderately Integrated (09/27/2022)   Social Connection and Isolation Panel [NHANES]    Frequency of Communication with Friends and Family: More than three times a week    Frequency of Social Gatherings with Friends and Family: More than three times a week    Attends Religious Services: More than 4 times per year    Active Member of [employer] or Organizations: Yes    Attends Banker Meetings: More than 4 times per year    Marital Status: Widowed  Intimate Partner Violence: Not At Risk (09/27/2022)   Humiliation, Afraid, Rape, and Kick questionnaire    Fear of Current or Ex-Partner: No    Emotionally Abused: No    Physically Abused: No    Sexually Abused: No    FH:  Family History  Problem Relation Age of Onset   CAD Mother 72       MI, aortic valve issues   COPD Mother    Lupus Mother    Luiz Blare' disease Mother    Rheum arthritis Mother    CAD Father 79       CABG x2, aortic valve replacement   Stroke Sister    CAD Sister    Anuerysm Sister        brain   Lupus Sister    Diabetes Sister    Diabetes Sister    Breast cancer Sister    Alcohol abuse Brother    CAD Brother 53       MI   Stroke Brother    COPD Brother        agent orange   CAD Brother 60       stent   Diabetes Brother    Stroke Maternal Grandmother    Hypertension Maternal Grandmother    Gallbladder disease Maternal Grandmother    Breast cancer Maternal Aunt    Breast cancer Maternal Aunt    Depression Grandchild    Colon cancer Neg Hx    Esophageal cancer Neg Hx    Rectal cancer Neg Hx    Stomach cancer Neg Hx     Past Medical History:  Diagnosis Date   Acute diverticulitis 04/2021   [facility] ER, CT confirmed   Allergy    ANA positive    positive ANA pattern 1  speckled   Arthritis    Carotid stenosis, asymptomatic 06/19/2015   1-39% RICA 40-59% LICA rpt 1 yr (05/2015)    CHF (congestive heart failure) (HCC)    Colon polyps    COVID-19 virus infection 09/14/2021   Dermatomyositis (HCC)    Diabetes mellitus without complication (HCC)    Type 1   Diverticulosis    sigmoid on CT scan 12/2019   Family history of adverse reaction to anesthesia    brothr went into cardiac  arrest from anectine   Fibromyalgia    prior PCP   GERD (gastroesophageal reflux disease)    prior PCP   Glaucoma    Narrow angle   History of blood clots    DVT, in 20s, none since   History of chicken pox    History of diverticulitis    History of pericarditis 1986   with hospitalization   History of pneumonia 2014   History of shingles    History of UTI    Hyperlipidemia    Hypertension    Hypothyroidism    Mixed connective tissue disease (HCC)    Partial small bowel obstruction (HCC) 12/2019   managed conservatively   Peptic ulcer    Pneumonia    PONV (postoperative nausea and vomiting)    Raynaud's disease without gangrene    Shoulder pain left   h/o RTC tendonitis and adhesive capsulitis   Sigmoid diverticulitis 05/26/2021   Sjogren's syndrome (HCC)    Sleep apnea    prior PCP - no CPAP for about 10 yrs   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HCC)    Vitamin D deficiency    prior PCP    Current Outpatient Medications  Medication Sig Dispense Refill   acetaminophen (TYLENOL) 500 MG tablet Take 1,500 mg by mouth daily.     albuterol (VENTOLIN HFA) 108 (90 Base) MCG/ACT inhaler Inhale 2 puffs into the lungs every 6 (six) hours as needed. 8 g 2   Ascorbic Acid (VITAMIN C) 100 MG tablet Take 100 mg by mouth daily.     aspirin EC 81 MG tablet Take 1 tablet (81 mg total) by mouth daily. Swallow whole. 30 tablet 12   Baclofen 5 MG TABS Take 1-2 tablets (5-10 mg total) by mouth at bedtime as needed (neck pain). 30 tablet 3   Cholecalciferol (VITAMIN D3) 25 MCG (1000 UT) CAPS  Take 2 capsules (2,000 Units total) by mouth daily.     clopidogrel (PLAVIX) 75 MG tablet Take 1 tablet (75 mg total) by mouth every evening. 30 tablet 2   diclofenac Sodium (VOLTAREN) 1 % GEL Apply 2 g topically 3 (three) times daily. 100 g 1   Fluticasone-Umeclidin-Vilant (TRELEGY ELLIPTA) 100-62.5-25 MCG/ACT AEPB Inhale 1 puff into the lungs daily. 28 each 11   Glucagon, rDNA, (GLUCAGON EMERGENCY) 1 MG KIT INJECT into THE muscle ONCE AS NEEDED FOR emergency 1 kit 12   insulin degludec (TRESIBA FLEXTOUCH) 100 UNIT/ML FlexTouch Pen Inject 60 Units into the skin at bedtime. (Patient taking differently: Inject 60 Units into the skin at bedtime. 60 units daily as divided doses with meals) 54 mL 1   insulin lispro (HUMALOG) 100 UNIT/ML injection Inject 5 Units into the skin 3 (three) times daily before meals.     isosorbide mononitrate (IMDUR) 30 MG 24 hr tablet Take 0.5 tablets (15 mg total) by mouth daily. 30 tablet 6   losartan (COZAAR) 25 MG tablet TAKE 1 TABLET(25 MG) BY MOUTH DAILY 90 tablet 3   metoprolol succinate (TOPROL-XL) 25 MG 24 hr tablet TAKE 1 TABLET(25 MG) BY MOUTH DAILY (Patient taking differently: daily as needed.) 30 tablet 5   Multiple Vitamin (MULTIVITAMIN ADULT) TABS Take 1 tablet by mouth in the morning and at bedtime.     nitroGLYCERIN (NITROSTAT) 0.4 MG SL tablet Place 1 tablet (0.4 mg total) under the tongue every 5 (five) minutes as needed for chest pain. 25 tablet PRN   rosuvastatin (CRESTOR) 20 MG tablet Take 1 tablet (20 mg total) by mouth  every evening. 90 tablet 4   SYNTHROID 75 MCG tablet TAKE ONE TABLET BY MOUTH Monday-Saturday BEFORE breakfast 80 tablet 4   topiramate (TOPAMAX) 50 MG tablet Take 1 tablet (50 mg total) by mouth 2 (two) times daily. (Patient taking differently: Take 50 mg by mouth as needed.) 60 tablet 12   torsemide (DEMADEX) 20 MG tablet Take 2 tablets (40 mg total) by mouth daily. 60 tablet 5   No current facility-administered medications for this  visit.   Vitals:   06/20/23 0944  BP: 126/64  Pulse: 74  SpO2: 96%  Weight: 155 lb 3.2 oz (70.4 kg)   Wt Readings from Last 3 Encounters:  06/20/23 155 lb 3.2 oz (70.4 kg)  06/06/23 156 lb 2 oz (70.8 kg)  05/30/23 157 lb (71.2 kg)   Lab Results  Component Value Date   CREATININE 0.83 03/14/2023   CREATININE 0.93 01/13/2023   CREATININE 0.91 11/25/2022    PHYSICAL EXAM:  General:  Well appearing. No resp difficulty HEENT: normal Neck: supple. JVP flat. No lymphadenopathy or thryomegaly appreciated. Cor: PMI normal. Regular rate & rhythm. No rubs, gallops or murmurs. Lungs: clear Abdomen: soft, nontender, nondistended. No hepatosplenomegaly. No bruits or masses. Extremities: no cyanosis, clubbing, rash, trace pitting edema bilateral lower legs Neuro: alert & oriented x3, cranial nerves grossly intact. Moves all 4 extremities w/o difficulty. Affect pleasant.   ECG: 03/23/23 showed NSR with HR 78   ASSESSMENT & PLAN:  NICM with preserved EF- - likely d/t HTN as cath showed no critical CAD - NYHA III - euvolemic - weighing daily; reminded to call for an overnight weight gain of >2 pounds or a weekly weight gain of >5 pounds - weight down 1 pound from last visit here 2 months ago - Echo 07/30/20: EF of 55-60% with grade II diastolic dysfunction.  - Echo 0/98/11: EF of 60-65% along with mild MR.  - Echo 12/23/22: EF 60-65% with Grade I DD.  - RHC 12/02/22: No critical coronary artery disease.  The is up to ~50% stenosis at the ostium of the RCA with pressure dampening noted using 73F diagnostic catheter.  Slight improvement noted with intracoronary nitroglycerin suggesting at least some component of vasospasm.  No angiographically significant coronary artery disease noted in the left coronary artery. Normal left and right heart filling pressures. Normal Fick cardiac output/index. Small right radial artery with significant vasospasm throughout the procedure.  Recommend alternate  access for future catheterizations. - adhering to low sodium diet and fluid restriction - continue losartan 25mg  daily - continue metoprolol succinate 25mg  daily PRN - continue torsemide 40mg  daily - valsartan caused hacking cough - can not add SGLT2 due to LADA - saw HF provider Gasper Lloyd) 03/24 - saw cardiology Mariah Milling) 06/24 - BNP 01/13/23 was 74.4 - PharmD reconciled meds with patient  2. HTN - BP 126/64 - saw PCP Sharen Hones) 09/24 - BMP 03/14/23 reviewed: sodium 137, potassium 3.9, creatinine 0.83 & GFR >60   3. LADA - saw endocrinology Dell Ponto) 09/24 - A1c 06/12/23 was 9.7%  4: Mild obstructive sleep apnea - saw pulmonology Jayme Cloud) 09/24 - home sleep study done 09/22/22 which showed mild obstructive sleep apnea with AHI of 8.5 and oxygen saturations as low as 82% nocturnally. Patient declined CPAP/oxygen.  - PFT's 04/22/22  Return in 3 months, sooner if needed.

## 2023-06-20 NOTE — Telephone Encounter (Signed)
Error jb,cma

## 2023-07-20 ENCOUNTER — Emergency Department
Admission: EM | Admit: 2023-07-20 | Discharge: 2023-07-20 | Disposition: A | Discharge status: Home / Self Care | Payer: PPO | Attending: Emergency Medicine | Admitting: Emergency Medicine

## 2023-07-20 ENCOUNTER — Other Ambulatory Visit: Payer: Self-pay

## 2023-07-20 ENCOUNTER — Emergency Department: Payer: PPO

## 2023-07-20 ENCOUNTER — Observation Stay: Payer: PPO

## 2023-07-20 DIAGNOSIS — R079 Chest pain, unspecified: Secondary | ICD-10-CM | POA: Diagnosis not present

## 2023-07-20 DIAGNOSIS — R0602 Shortness of breath: Secondary | ICD-10-CM | POA: Insufficient documentation

## 2023-07-20 DIAGNOSIS — Z7982 Long term (current) use of aspirin: Secondary | ICD-10-CM | POA: Insufficient documentation

## 2023-07-20 DIAGNOSIS — I509 Heart failure, unspecified: Secondary | ICD-10-CM | POA: Diagnosis not present

## 2023-07-20 DIAGNOSIS — R0902 Hypoxemia: Secondary | ICD-10-CM | POA: Diagnosis not present

## 2023-07-20 DIAGNOSIS — I2510 Atherosclerotic heart disease of native coronary artery without angina pectoris: Secondary | ICD-10-CM | POA: Insufficient documentation

## 2023-07-20 DIAGNOSIS — E039 Hypothyroidism, unspecified: Secondary | ICD-10-CM | POA: Diagnosis not present

## 2023-07-20 DIAGNOSIS — I110 Hypertensive heart disease with heart failure: Secondary | ICD-10-CM | POA: Insufficient documentation

## 2023-07-20 LAB — CBC
HCT: 40.6 % (ref 36.0–46.0)
Hemoglobin: 13.2 g/dL (ref 12.0–15.0)
MCH: 27.2 pg (ref 26.0–34.0)
MCHC: 32.5 g/dL (ref 30.0–36.0)
MCV: 83.5 fL (ref 80.0–100.0)
Platelets: 208 10*3/uL (ref 150–400)
RBC: 4.86 MIL/uL (ref 3.87–5.11)
RDW: 14 % (ref 11.5–15.5)
WBC: 6.6 10*3/uL (ref 4.0–10.5)
nRBC: 0 % (ref 0.0–0.2)

## 2023-07-20 LAB — BASIC METABOLIC PANEL
Anion gap: 8 (ref 5–15)
BUN: 11 mg/dL (ref 8–23)
CO2: 24 mmol/L (ref 22–32)
Calcium: 9.3 mg/dL (ref 8.9–10.3)
Chloride: 108 mmol/L (ref 98–111)
Creatinine, Ser: 0.92 mg/dL (ref 0.44–1.00)
GFR, Estimated: >60 mL/min (ref >60)
Glucose, Bld: 175 mg/dL — ABNORMAL HIGH (ref 70–99)
Potassium: 4 mmol/L (ref 3.5–5.1)
Sodium: 140 mmol/L (ref 135–145)

## 2023-07-20 LAB — TROPONIN I (HIGH SENSITIVITY)
Troponin I (High Sensitivity): 4 ng/L (ref <18)
Troponin I (High Sensitivity): 4 ng/L (ref <18)

## 2023-07-20 LAB — D-DIMER, QUANTITATIVE: D-Dimer, Quant: 0.78 ug{FEU}/mL — ABNORMAL HIGH (ref 0.00–0.50)

## 2023-07-20 MED ORDER — TECHNETIUM TO 99M ALBUMIN AGGREGATED
4.0600 | Freq: Once | INTRAVENOUS | Status: AC | PRN
  Administered 2023-07-20: 4.06 via INTRAVENOUS

## 2023-07-20 NOTE — ED Provider Notes (Signed)
[facility] Provider Note    Event Date/Time   First MD Initiated Contact with Patient 07/20/23 1637     (approximate)   History   Shortness of Breath   HPI  Cassandra Benson is a 82 year old female with history of hypertension, CAD, CHF, hypothyroidism, fibromyalgia presenting to the emergency department for evaluation of shortness of breath.  EMS report that patient developed chest pain and shortness of breath yesterday, was given nitro and aspirin with some relief.  Patient tells me she actually had a follow-up call with her primary care office nurse today.  During the call they felt that she was getting winded so they recommended that she present to the ER.  Reports she has had some ongoing fatigue, denies any active chest pain or shortness of breath at the time of my initial evaluation.  I reviewed her cardiology visit from 06/16/2023.  She had a right heart cath on 12/02/2022 that did not demonstrate critical coronary artery disease, up to 50% stenosis of RCA noted with concern for possible component of vasospasm.     Physical Exam   Triage Vital Signs: ED Triage Vitals [07/20/23 1109]  Encounter Vitals Group     BP 130/71     Systolic BP Percentile      Diastolic BP Percentile      Pulse Rate 88     Resp 18     Temp 98 F (36.7 C)     Temp Source Oral     SpO2 98 %     Weight      Height      Head Circumference      Peak Flow      Pain Score 0     Pain Loc      Pain Education      Exclude from Growth Chart     Most recent vital signs: Vitals:   07/20/23 1109 07/20/23 1410  BP: 130/71 (!) 142/74  Pulse: 88 68  Resp: 18 16  Temp: 98 F (36.7 C) 97.8 F (36.6 C)  SpO2: 98% 100%     General: Awake, interactive  CV:  Regular rate, good peripheral perfusion.  Resp:  Lungs clear to auscultation, unlabored respirations, speaking in full sentences without appreciable dyspnea Abd:  Nondistended.  Neuro:  Symmetric facial  movement, fluid speech   ED Results / Procedures / Treatments   Labs (all labs ordered are listed, but only abnormal results are displayed) Labs Reviewed  BASIC METABOLIC PANEL - Abnormal; Notable for the following components:      Result Value   Glucose, Bld 175 (*)    All other components within normal limits  D-DIMER, QUANTITATIVE - Abnormal; Notable for the following components:   D-Dimer, Quant 0.78 (*)    All other components within normal limits  CBC  TROPONIN I (HIGH SENSITIVITY)  TROPONIN I (HIGH SENSITIVITY)     EKG EKG independently reviewed interpreted by myself (ER attending) demonstrates:  EKG demonstrates normal sinus rhythm rate of 76, PR 188, QR 74, QTc 434, no acute ST changes  RADIOLOGY Imaging independently reviewed and interpreted by myself demonstrates:  CXR without focal consolidation VQ scan without evidence of PE   PROCEDURES:  Critical Care performed: No  Procedures   MEDICATIONS ORDERED IN ED: Medications  technetium albumin aggregated (MAA) injection solution 4.06 millicurie (4.06 millicuries Intravenous Contrast Given 07/20/23 1525)     IMPRESSION / MDM / ASSESSMENT AND PLAN / ED COURSE  I reviewed the triage vital signs and the nursing notes.  Differential diagnosis includes, but is not limited to, ACS, PE, pneumothorax, pneumonia, progression of multiple chronic diseases  Patient's presentation is most consistent with acute presentation with potential threat to life or bodily function.  82 year old female presenting with shortness of breath.  Stable vitals on exam.  Lab work with slightly elevated D-dimer, otherwise reassuring including negative troponin x 2.  Chest x-[name] reassuring.  VQ scan without evidence of pulmonary embolism.  Initial concern for patient being winded with speech, but able to speak in full sentences without any distress on my evaluation.  She is comfortable with discharge home.  Has follow-up with cardiology and  her primary care office.  Do think she is stable for discharge home.  Strict return precautions righted.  Patient discharged stable condition.     FINAL CLINICAL IMPRESSION(S) / ED DIAGNOSES   Final diagnoses:  SOB (shortness of breath)     Rx / DC Orders   ED Discharge Orders     None        Note:  This document was prepared using Dragon voice recognition software and may include unintentional dictation errors.   Trinna Post, MD 07/20/23 [phone]

## 2023-07-20 NOTE — ED Triage Notes (Signed)
First Nurse Note;  Pt via [facility] from home. Pt c/o SOB/CP starting yesterday worse today. Radiates to the back. EMS gave 1 spray of Nitroglycerin and 324 ASA with some relief. 12 lead unremarkable per EMS. Pt is A&Ox4 and NAD.  80 HR  99% on RA 152/80 BP  20 G L AC

## 2023-07-20 NOTE — Discharge Instructions (Signed)
You were seen in the ER today for evaluation of your shortness of breath.  Your testing was fortunately reassuring including your VQ scan to look for PE and a negative troponin twice.  Please continue to follow-up with your primary care doctor and cardiologist for further evaluation.  Return to the ER for any new or worsening symptoms.

## 2023-07-20 NOTE — Telephone Encounter (Signed)
Unable to reach pt by phone and left v/m requesting cb to [phone] and I also my charted pt. Sending note to Dr Reece Agar, lsc triage. Pts my chart note was 10 days ago.

## 2023-07-20 NOTE — Telephone Encounter (Signed)
Agree. Thanks

## 2023-07-20 NOTE — Telephone Encounter (Addendum)
I spoke with pt who was very SOB. Pt said upon any exertion she has SOB and now pt is sitting and still very SOB.pt said FBS today was 139.pt had CP and SOB on 07/19/23 but no CP today just very SOB. Pt said she is going to go to [facility] ED but was trying to "get herself together" before calling 911. Pt asked me to call EMS for her. Pt said front door is unlocked and pt feels OK to hang up with me until EMS gets there. If pt condition changes or worsens pt will call 911 with update.sending note to Dr Reece Agar who is out of office and [facility] pool. And FYI to Dr Para March who is in office.

## 2023-07-20 NOTE — ED Triage Notes (Signed)
See triage note  Pt states HX of blood clots and endorses its hard to take a breath.

## 2023-07-20 NOTE — Telephone Encounter (Signed)
Plz triage pt.  °

## 2023-07-24 ENCOUNTER — Other Ambulatory Visit: Payer: Self-pay | Admitting: Vascular Surgery

## 2023-07-27 NOTE — Telephone Encounter (Signed)
Sent my chart note on 07/26/23 at 7:38 pm; pt said no stress; but any exertion such as light cleaning or preparing meal pt gets exhausted. Pt eating healthy. Pt said endo told pt she is a type 1 diabetic. Pt said she is concerned about having chest pressure with minimal activity. No pain or pressure in chest 07/27/23.  Pt said right now has feeling of pressure on rt side of chest but only last short time. Pt said she does not need to go to ED. Offered pt appt to be seen at [facility] today but pt said she is OK to wait on appt with Dr Reece Agar. UC & ED precautions given and pt voiced understanding. .Sending FYI note to Dr Reece Agar who is out of office and Dr Para March. Pt appreciates call and Dr Timoteo Expose good care of pt.

## 2023-07-27 NOTE — Telephone Encounter (Signed)
Noted.  Thank you for offering patient evaluation in the meantime.

## 2023-08-07 ENCOUNTER — Encounter: Payer: Self-pay | Admitting: Family Medicine

## 2023-08-07 ENCOUNTER — Ambulatory Visit (INDEPENDENT_AMBULATORY_CARE_PROVIDER_SITE_OTHER): Payer: PPO | Admitting: Family Medicine

## 2023-08-07 VITALS — BP 126/72 | HR 85 | Temp 97.9°F | Ht 63.0 in | Wt 157.2 lb

## 2023-08-07 DIAGNOSIS — I739 Peripheral vascular disease, unspecified: Secondary | ICD-10-CM

## 2023-08-07 DIAGNOSIS — M546 Pain in thoracic spine: Secondary | ICD-10-CM

## 2023-08-07 DIAGNOSIS — R079 Chest pain, unspecified: Secondary | ICD-10-CM | POA: Diagnosis not present

## 2023-08-07 DIAGNOSIS — M797 Fibromyalgia: Secondary | ICD-10-CM | POA: Diagnosis not present

## 2023-08-07 DIAGNOSIS — G8929 Other chronic pain: Secondary | ICD-10-CM

## 2023-08-07 DIAGNOSIS — I10 Essential (primary) hypertension: Secondary | ICD-10-CM | POA: Diagnosis not present

## 2023-08-07 DIAGNOSIS — E13319 Other specified diabetes mellitus with unspecified diabetic retinopathy without macular edema: Secondary | ICD-10-CM

## 2023-08-07 DIAGNOSIS — I201 Angina pectoris with documented spasm: Secondary | ICD-10-CM | POA: Diagnosis not present

## 2023-08-07 DIAGNOSIS — R0609 Other forms of dyspnea: Secondary | ICD-10-CM | POA: Diagnosis not present

## 2023-08-07 NOTE — Assessment & Plan Note (Addendum)
Appreciate pulm care. Mild asthma/obstructive lung diseaese on PFTs 03/2022 - continue trelegy and PRN albuterol.

## 2023-08-07 NOTE — Assessment & Plan Note (Signed)
Contributes to chronic pain.  Intolerance to TCAs, gabapentin in the past. Lyrica may have caused weight gain.  Discussed possible trial LDN through compounding pharmacy, website provided for her to investigate.  Will further discuss at f/u visit, or she can let me know if desires to try this.  She is not on opiate.

## 2023-08-07 NOTE — Assessment & Plan Note (Signed)
Thought component of coronary vasospasm - continues imdur 15mg  daily.

## 2023-08-07 NOTE — Progress Notes (Signed)
Ph: [phone] Fax: [phone]   Patient ID: Cassandra Benson, female    DOB: 16-Jul-1941, 82 y.o.   MRN: [id number]  This visit was conducted in person.  BP 126/72   Pulse 85   Temp 97.9 F (36.6 C) (Oral)   Ht 5\' 3"  (1.6 m)   Wt 157 lb 4 oz (71.3 kg)   SpO2 98%   BMI 27.86 kg/m    CC: 2 mo f/u visit  Subjective:   HPI: Cassandra Benson is a 82 y.o. female presenting on 08/07/2023 for Medical Management of Chronic Issues (Here for 2 mo f/u.)   Notes ongoing marked fatigue, wide sugar fluctuations and mild pedal edema.   Followed by cardiology Mariah Milling) and CHF clinic Clarisa Kindred NP). Cardiac CTA without significant disease. Likely coronary spasms causing chest pain. 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 Continues imdur 30mg  1/2 tablet daily. Has been referred to cardiac rehab. Reassuring echocardiogram 11/2022.  Last saw CHF clinic 06/20/2023 - at that time continued losartan, Toprol XL, and torsemide.   Mild OSA followed by pulm Jayme Cloud) seen last week - she declined CPAP/O2. Asthma - well controlled on Trelegy 100 1 puff once daily and PRN albuterol inhaler.   DM - recent diagnosis type 1 diabetes by endo Dr Dell Ponto at [facility].  Last seen 06/12/2023. Tresiba increased to 28u daily, lispro 5u with meals. Rec Dexcom G7 CGM use - not currently using. A1c at that time was 9.7%.   Subsequently seen at ER on 07/20/2023 for ongoing dyspnea and chest pain relieved by SL nitro. Records reviewed. D dimer mildly elevated at 0.78 - VQ scan reassuring. Cr 0.92. TnI x2 normal.      Relevant past medical, surgical, family and social history reviewed and updated as indicated. Interim medical history since our last visit reviewed. Allergies and medications reviewed and updated. Outpatient Medications Prior to Visit  Medication Sig Dispense Refill   acetaminophen (TYLENOL) 500 MG tablet Take 1,000 mg by mouth daily.     albuterol (VENTOLIN HFA) 108 (90 Base) MCG/ACT  inhaler Inhale 2 puffs into the lungs every 6 (six) hours as needed. 8 g 2   Ascorbic Acid (VITAMIN C) 100 MG tablet Take 100 mg by mouth daily.     aspirin EC 81 MG tablet Take 1 tablet (81 mg total) by mouth daily. Swallow whole. 30 tablet 12   Baclofen 5 MG TABS Take 1-2 tablets (5-10 mg total) by mouth at bedtime as needed (neck pain). 30 tablet 3   Cholecalciferol (VITAMIN D3) 25 MCG (1000 UT) CAPS Take 2 capsules (2,000 Units total) by mouth daily.     clopidogrel (PLAVIX) 75 MG tablet TAKE 1 TABLET(75 MG) BY MOUTH EVERY EVENING 30 tablet 11   diclofenac Sodium (VOLTAREN) 1 % GEL Apply 2 g topically 3 (three) times daily. 100 g 1   Fluticasone-Umeclidin-Vilant (TRELEGY ELLIPTA) 100-62.5-25 MCG/ACT AEPB Inhale 1 puff into the lungs daily. 28 each 11   Glucagon, rDNA, (GLUCAGON EMERGENCY) 1 MG KIT INJECT into THE muscle ONCE AS NEEDED FOR emergency 1 kit 12   insulin degludec (TRESIBA FLEXTOUCH) 100 UNIT/ML FlexTouch Pen Inject 60 Units into the skin at bedtime. (Patient taking differently: Inject 56 Units into the skin at bedtime. 56 units daily as divided doses with meals) 54 mL 1   insulin lispro (HUMALOG) 100 UNIT/ML injection Inject 5 Units into the skin 3 (three) times daily before meals.     isosorbide mononitrate (IMDUR) 30 MG 24  hr tablet Take 0.5 tablets (15 mg total) by mouth daily. 30 tablet 6   losartan (COZAAR) 25 MG tablet TAKE 1 TABLET(25 MG) BY MOUTH DAILY 90 tablet 3   metoprolol succinate (TOPROL-XL) 25 MG 24 hr tablet TAKE 1 TABLET(25 MG) BY MOUTH DAILY (Patient taking differently: 25 mg daily as needed.) 30 tablet 5   Multiple Vitamin (MULTIVITAMIN ADULT) TABS Take 1 tablet by mouth in the morning and at bedtime.     nitroGLYCERIN (NITROSTAT) 0.4 MG SL tablet Place 1 tablet (0.4 mg total) under the tongue every 5 (five) minutes as needed for chest pain. 25 tablet PRN   rosuvastatin (CRESTOR) 20 MG tablet Take 1 tablet (20 mg total) by mouth every evening. 90 tablet 4    SYNTHROID 75 MCG tablet TAKE ONE TABLET BY MOUTH Monday-Saturday BEFORE breakfast 80 tablet 4   topiramate (TOPAMAX) 50 MG tablet Take 1 tablet (50 mg total) by mouth 2 (two) times daily. (Patient taking differently: Take 50 mg by mouth as needed.) 60 tablet 12   torsemide (DEMADEX) 20 MG tablet Take 2 tablets (40 mg total) by mouth daily. 60 tablet 5   No facility-administered medications prior to visit.     Per HPI unless specifically indicated in ROS section below Review of Systems  Objective:  BP 126/72   Pulse 85   Temp 97.9 F (36.6 C) (Oral)   Ht 5\' 3"  (1.6 m)   Wt 157 lb 4 oz (71.3 kg)   SpO2 98%   BMI 27.86 kg/m   Wt Readings from Last 3 Encounters:  08/07/23 157 lb 4 oz (71.3 kg)  06/20/23 155 lb 3.2 oz (70.4 kg)  06/06/23 156 lb 2 oz (70.8 kg)      Physical Exam Vitals and nursing note reviewed.  Constitutional:      Appearance: Normal appearance. She is not ill-appearing.  HENT:     Head: Normocephalic and atraumatic.     Mouth/Throat:     Mouth: Mucous membranes are moist.     Pharynx: Oropharynx is clear. No oropharyngeal exudate or posterior oropharyngeal erythema.  Eyes:     Extraocular Movements: Extraocular movements intact.     Conjunctiva/sclera: Conjunctivae normal.     Pupils: Pupils are equal, round, and reactive to light.  Cardiovascular:     Rate and Rhythm: Normal rate and regular rhythm.     Pulses: Normal pulses.     Heart sounds: Normal heart sounds. No murmur heard. Pulmonary:     Effort: Pulmonary effort is normal. No respiratory distress.     Breath sounds: Normal breath sounds. No wheezing, rhonchi or rales.  Musculoskeletal:     Right lower leg: Edema (tr) present.     Left lower leg: Edema (tr) present.  Skin:    General: Skin is warm and dry.     Findings: No rash.  Neurological:     Mental Status: She is alert.  Psychiatric:        Mood and Affect: Mood normal.        Behavior: Behavior normal.       Results for orders  placed or performed during the hospital encounter of 07/20/23  Basic metabolic panel  Result Value Ref Range   Sodium 140 135 - 145 mmol/L   Potassium 4.0 3.5 - 5.1 mmol/L   Chloride 108 98 - 111 mmol/L   CO2 24 22 - 32 mmol/L   Glucose, Bld 175 (H) 70 - 99 mg/dL   BUN 11  8 - 23 mg/dL   Creatinine, Ser 1.61 0.44 - 1.00 mg/dL   Calcium 9.3 8.9 - 09.6 mg/dL   GFR, Estimated >04 >54 mL/min   Anion gap 8 5 - 15  CBC  Result Value Ref Range   WBC 6.6 4.0 - 10.5 K/uL   RBC 4.86 3.87 - 5.11 MIL/uL   Hemoglobin 13.2 12.0 - 15.0 g/dL   HCT 09.8 11.9 - 14.7 %   MCV 83.5 80.0 - 100.0 fL   MCH 27.2 26.0 - 34.0 pg   MCHC 32.5 30.0 - 36.0 g/dL   RDW 82.9 56.2 - 13.0 %   Platelets 208 150 - 400 K/uL   nRBC 0.0 0.0 - 0.2 %  D-dimer, quantitative  Result Value Ref Range   D-Dimer, Quant 0.78 (H) 0.00 - 0.50 ug/mL-FEU  Troponin I (High Sensitivity)  Result Value Ref Range   Troponin I (High Sensitivity) 4 <18 ng/L  Troponin I (High Sensitivity)  Result Value Ref Range   Troponin I (High Sensitivity) 4 <18 ng/L   *Note: Due to a large number of results and/or encounters for the requested time period, some results have not been displayed. A complete set of results can be found in Results Review.    Assessment & Plan:   Problem List Items Addressed This Visit     Essential hypertension    Chronic, well controlled on current regimen - continue.       Chest pain    Thought component of coronary vasospasm - continues imdur 15mg  daily.       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  Thought T1DM, recent new diagnosis from LADA.  Continues tresiba 28u daily with humalog 5u with meals.  CBG log sheet provided today to keep track over next 2 weeks and take with her to next OV.       Fibromyalgia    Contributes to chronic pain.  Intolerance to TCAs, gabapentin in the past. Lyrica may have caused  weight gain.  Discussed possible trial LDN through compounding pharmacy, website provided for her to investigate.  Will further discuss at f/u visit, or she can let me know if desires to try this.  She is not on opiate.       PAD (peripheral artery disease) (HCC)    Continue aspirin, statin ,plavix      Chronic midline thoracic back pain    Ongoing. Has seen PM&R.       Chronic dyspnea    Appreciate pulm care. Mild asthma/obstructive lung diseaese on PFTs 03/2022 - continue trelegy and PRN albuterol.      Coronary artery vasospasm (HCC)     No orders of the defined types were placed in this encounter.   No orders of the defined types were placed in this encounter.   Patient Instructions  Use blood sugar monitoring sheet provided today to keep track of sugars over the next 2 weeks to take for Dr Sherrye Payor next visit.  Consider low dose naltrexone for fibromyalgia. Let me know if interested in trying this for a month to see if benefit.  Good to see you today.  Return in 3 months for wellness visit/physical.   [email or web address]   Follow up plan: Return in about 3 months (around 11/07/2023) for annual exam, prior fasting for blood work, medicare wellness visit.  Eustaquio Boyden, MD

## 2023-08-07 NOTE — Patient Instructions (Addendum)
Use blood sugar monitoring sheet provided today to keep track of sugars over the next 2 weeks to take for Dr Sherrye Payor next visit.  Consider low dose naltrexone for fibromyalgia. Let me know if interested in trying this for a month to see if benefit.  Good to see you today.  Return in 3 months for wellness visit/physical.   [email or web address]

## 2023-08-07 NOTE — Assessment & Plan Note (Signed)
Chronic, appreciate endo care.  Thought T1DM, recent new diagnosis from LADA.  Continues tresiba 28u daily with humalog 5u with meals.  CBG log sheet provided today to keep track over next 2 weeks and take with her to next OV.

## 2023-08-07 NOTE — Assessment & Plan Note (Signed)
Continue aspirin, statin, plavix.  

## 2023-08-07 NOTE — Assessment & Plan Note (Signed)
Chronic, well controlled on current regimen - continue.  

## 2023-08-07 NOTE — Assessment & Plan Note (Addendum)
Ongoing. Has seen PM&R.

## 2023-08-23 ENCOUNTER — Telehealth: Payer: Self-pay | Admitting: Family Medicine

## 2023-08-23 NOTE — Telephone Encounter (Signed)
Call out to patient at both #s in chart, unable to reach her. I was returning her request for a phone call to discuss [facility] situation

## 2023-08-28 ENCOUNTER — Telehealth: Payer: Self-pay | Admitting: Family Medicine

## 2023-08-28 NOTE — Telephone Encounter (Signed)
Spoke with pt's daughter, Cassandra Benson (on dpr), concerning her message and pt's memory. I offering OV, expressing it would be beneficial for her to be present if possible. Cassandra Benson expresses understanding and scheduled OV on 10/05/22 at 4:00 and she will let pt know. Fyi to Dr Reece Agar.   Also, Dr Reece Agar states he received a message from pt with same concerns and has not been able to contact her.

## 2023-08-28 NOTE — Telephone Encounter (Signed)
Patient daughter [name] called in and stated that she has some concerns regarding her moms mental and memory status. She stated that last week her mom got lost driving home and ended up in [place]. She stated that this is very concerning because it can be a danger to her and others. She also stated that she keeps thinking a guy name Rubye Oaks is a danger to herbut he isn't and she doesn't want him to lose his job or anything due to her mental status. She stated that she don't think she is eating or drinking much either. She also stated that she is going to try and fly up here from [place] next month. She stated that she can be reached at [phone] or [phone]. Thank you!

## 2023-08-28 NOTE — Telephone Encounter (Signed)
Tried to call again - pt unavailable. Will try again later.

## 2023-08-29 ENCOUNTER — Other Ambulatory Visit: Payer: Self-pay | Admitting: Family

## 2023-08-29 DIAGNOSIS — I5032 Chronic diastolic (congestive) heart failure: Secondary | ICD-10-CM

## 2023-08-29 NOTE — Telephone Encounter (Addendum)
See previously dated phone note.  Spoke with daughter [name] as well.

## 2023-08-29 NOTE — Telephone Encounter (Signed)
Spoke with patient regarding her concerns.  She got lost driving last week from son's house to her house - ended up in [place] at 8:30pm.  Recommend no nocturnal driving, recommend only driving to familiar places at this time.   She had another episode of presyncope several months ago - vision turning dark while walking out of her laundry room associated with vertigo/dizziness. No fall.   She states someone named Leonor Liv or [place] presented as a Patent examiner had come out to her house asking her all sorts of questions to get information out of her. Advised we have not ordered home health - she states she has gotten police involved.

## 2023-09-04 ENCOUNTER — Telehealth: Payer: Self-pay | Admitting: Family Medicine

## 2023-09-04 NOTE — Telephone Encounter (Signed)
Copied from CRM [id number]. Topic: Medicare AWV >> Sep 04, 2023  2:30 PM Payton Doughty wrote: Reason for CRM: LVM 09/04/23 to r/s AWV.  Please r/s appt w/ [facility]# [id number] spoof #[phone]   Verlee Rossetti; Care Guide Ambulatory Clinical Support [place] [facility] Direct Dial: [phone]

## 2023-09-07 ENCOUNTER — Ambulatory Visit: Payer: PPO | Admitting: Pulmonary Disease

## 2023-09-07 ENCOUNTER — Encounter: Payer: Self-pay | Admitting: Pulmonary Disease

## 2023-09-07 VITALS — BP 122/80 | HR 87 | Temp 97.1°F | Ht 63.0 in | Wt 155.0 lb

## 2023-09-07 DIAGNOSIS — E109 Type 1 diabetes mellitus without complications: Secondary | ICD-10-CM | POA: Diagnosis not present

## 2023-09-07 DIAGNOSIS — R0602 Shortness of breath: Secondary | ICD-10-CM

## 2023-09-07 DIAGNOSIS — J4530 Mild persistent asthma, uncomplicated: Secondary | ICD-10-CM

## 2023-09-07 DIAGNOSIS — G4733 Obstructive sleep apnea (adult) (pediatric): Secondary | ICD-10-CM

## 2023-09-07 LAB — NITRIC OXIDE: Nitric Oxide: 20

## 2023-09-07 NOTE — Patient Instructions (Signed)
VISIT SUMMARY:  Cassandra Benson, during your visit today, we discussed your ongoing management of type 1 diabetes, and asthma. You shared your challenges with maintaining stable blood sugar levels and your interest in resuming insulin pump therapy. We also reviewed your asthma management.  YOUR PLAN:  -ASTHMA: Asthma is a condition where your airways become inflamed and narrow, making it hard to breathe. Your asthma is well-controlled with your current medications, Trelegy and albuterol. Continue taking Trelegy once daily and albuterol as needed. We will monitor your airway inflammation with a test and follow up in four months or sooner if your symptoms worsen.  -TYPE 1 DIABETES MELLITUS: Type 1 diabetes is a condition where your body does not produce insulin, leading to high blood sugar levels. You are experiencing significant difficulty in controlling your blood sugar, with levels frequently reaching 400 mg/dL. We discussed the benefits of resuming insulin pump therapy to better manage your blood sugar. We will coordinate with your endocrinologist, Dr. Dell Ponto, to discuss insulin pump options.  -GENERAL HEALTH MAINTENANCE: You are unable to receive the flu vaccine due to past adverse reactions. We will continue to monitor your overall health and address any new health maintenance needs as they arise.  INSTRUCTIONS:  Please schedule a follow-up appointment in three months for diabetes and oxygen therapy management, and another follow-up in four months for asthma management. If your asthma symptoms worsen before then, please contact us immediately.

## 2023-09-07 NOTE — Progress Notes (Signed)
Subjective:    Patient ID: Cassandra Benson, female    DOB: [birth year], 82 y.o.   MRN: [id number]  Patient Care Team: Eustaquio Boyden, MD as PCP - General (Family Medicine) Antonieta Iba, MD as PCP - Cardiology (Cardiology) Galen Manila, MD as Referring Physician (Ophthalmology)  Chief Complaint  Patient presents with   Follow-up    SOB off and on. Occasional wheezing. Cough, dry.    BACKGROUND/INTERVAL:Patient is an 82 year old lifelong never smoker with a history as noted below, who presents for follow-up on the issue of dyspnea.  Last seen on 30 May 2023.  No ED visits or exacerbations since our last visit.  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  Presents today more preoccupied with diabetes than with asthma.  HPI Discussed the use of AI scribe software for clinical note transcription with the patient, who gave verbal consent to proceed.  History of Present Illness   Cassandra Benson, a patient with a history of type 1 diabetes and asthma, presents with concerns about her diabetes management. She describes her struggle with maintaining her blood sugar levels as "daunting," noting that her blood sugar can quickly rise to 400. She reports that she has been adhering to a diet low in carbohydrates and eating small meals three times a day. Despite these efforts, she experiences significant fluctuations in her blood sugar levels, which she finds "frightful." She has previously used an insulin pump and is considering using one again to better manage these fluctuations.  In addition to her diabetes, Cassandra Benson also discusses her asthma management. She is currently on Trelegy and albuterol, which she takes faithfully. She reports that her asthma is "talking to her," but does not provide further details about her symptoms. She also mentions that she cannot take the flu vaccine due to a previous serious reaction.  She does not endorse any  respiratory symptoms today. Specifically, does not complain of dyspnea which was a bothersome symptom previously. Nitric oxide level was measured today and is within normal.     DATA 12/24/2021 echocardiogram: LVEF 60 to 65%, grade 1 DD, normal right ventricular systolic function.  Normal pulmonary artery systolic pressure.  Mild MR. 04/11/2022 CT chest high-resolution: Mild pulmonary fibrosis pattern with apical to basal gradient, no significant air trapping on expiratory phase.  Not consistent with UIP.  Findings are very mild. 04/22/2022 PFTs: FEV1 1.91 L or 101% predicted, FVC 2.98 L or 117% predicted, FEV1/FVC 64%.  No bronchodilator response, lung volumes normal.  Mild diffusion capacity impairment.  Consistent with mild obstructive airways disease. 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 08/01/2022 connective tissue disease panel, acetylcholine receptor assay: Negative (notably ANA negative). 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  Patient declined CPAP/oxygen. 12/02/2022 right and left heart cath: 50% stenosis of the ostium of the RCA with pressure dampening.  Improvement with intracoronary nitroglycerin jesting component of vasospasm.  No angiographically significant coronary artery disease noted in the left coronary artery.  Normal left and right heart filling pressures.  Significant vasospasm noted on the right radial artery. 12/23/2022 echocardiogram: LVEF 60 to 65%, normal LV function, grade 1 DD, no aortic or mitral valve dysfunction. 01/13/2023 chest x-ray PA and lateral: No active pulmonary disease. 01/13/2023 VQ scan: Negative for pulmonary emboli. 01/13/2023 lower extremity Dopplers: No evidence of deep venous thrombosis in either lower extremity.  Review of Systems  A 10 point review of systems was performed and it is as noted above otherwise negative.   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   Chronic diastolic heart failure (HCC) 12/02/2022   Allergy to beta blocker 08/06/2022   Steroid-induced hyperglycemia 05/16/2022   Dysuria 02/25/2022   Body aches 02/15/2022   Insomnia 02/01/2022   Osteopenia 01/15/2022   Chronic dyspnea 12/22/2021   Unsteadiness 02/13/2021   Atherosclerosis of aorta (HCC) 12/16/2020   Neck pain 12/03/2020   Fall with injury 09/01/2020   Grade II diastolic dysfunction 08/01/2020   Chronic venous insufficiency 04/07/2020   Partial small bowel obstruction (HCC) 01/15/2020   Epistaxis 01/13/2020   Cognitive changes 10/22/2019   Right sided abdominal pain 10/07/2019   Renal insufficiency 09/14/2019   Chronic midline thoracic back pain 05/14/2019   Sjogren's syndrome without extraglandular involvement (HCC) 02/26/2019   Epigastric discomfort 12/17/2018   PAD (peripheral artery disease) (HCC) 10/04/2018   Raynaud disease 09/04/2018   Amaurosis fugax, both eyes 06/18/2018   TIA (transient ischemic attack) 05/21/2018   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   Facial paresthesia 02/07/2018   Pain and swelling of lower leg 12/25/2017   Fibromyalgia    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), bilateral 08/21/2017   Chronic right-sided headache 03/13/2017   Numbness and tingling of both lower extremities 03/13/2017   6th nerve palsy, 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   Fatty liver 02/13/2017   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) 10/03/2016   Acute pain of right shoulder 08/15/2016   Medicare annual wellness visit, subsequent 06/19/2015   Health maintenance examination 06/19/2015   Advanced care planning/counseling discussion 06/19/2015   Carotid stenosis, 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 06/19/2015   Vitamin D deficiency    Family history of premature CAD 06/02/2015   Chest pain 05/26/2015    Elevated testosterone level in female 09/04/2014   Hyperlipidemia associated with type 2 diabetes mellitus (HCC) 07/30/2014   Essential hypertension 07/17/2014   Hypothyroidism due to Hashimoto's thyroiditis 07/17/2014   OSA (obstructive sleep apnea) 08/22/2012   Chronic low back pain 02/27/2012   Positive ANA (antinuclear antibody) 01/06/2012    Social History   Tobacco Use   Smoking status: Never   Smokeless tobacco: Never  Substance Use Topics   Alcohol use: No    Allergies  Allergen Reactions   Iodinated Contrast Media Other (See Comments)    Itching (severe) and chest tightness   Penicillins Anaphylaxis, Swelling, Rash and Other (See Comments)    Has patient had a PCN reaction causing immediate rash, facial/tongue/throat swelling, SOB or lightheadedness with hypotension: Yes Has patient had a PCN reaction causing severe rash involving mucus membranes or skin necrosis: yes - remotely Has patient had a PCN reaction that required hospitalization: occurred while hospitalized Has patient had a PCN reaction occurring within the last 10 years: No If all of the above answers are "NO", then may proceed with Cephalosporin use.    Amlodipine Swelling    Pedal edema   Anectine [Succinylcholine] Other (See Comments)    Brother went into cardiac arrest.   Carvedilol Dermatitis    Facial acne   Codeine Nausea Only   Gabapentin Other (See Comments)    Gait abnormality   Influenza Vaccines Other (See Comments)    Muscle weakness; unable to walk   Insulin Aspart (Human Analog) Other (See Comments)    headache  Nortriptyline Other (See Comments)    Eye swelling and mouth drawed up   Prednisone     Marked severe hyperglycemia to oral and IM steroids   Valsartan Other (See Comments) and Cough    Allergy to generic only, "Hacking" cough   Zetia [Ezetimibe] Other (See Comments)    Bad muscle cramps   Erythromycin Rash and Swelling   Sulfa Antibiotics Rash    Current Meds   Medication Sig   acetaminophen (TYLENOL) 500 MG tablet Take 1,000 mg by mouth daily.   albuterol (VENTOLIN HFA) 108 (90 Base) MCG/ACT inhaler Inhale 2 puffs into the lungs every 6 (six) hours as needed.   Ascorbic Acid (VITAMIN C) 100 MG tablet Take 100 mg by mouth daily.   aspirin EC 81 MG tablet Take 1 tablet (81 mg total) by mouth daily. Swallow whole.   Baclofen 5 MG TABS Take 1-2 tablets (5-10 mg total) by mouth at bedtime as needed (neck pain).   Cholecalciferol (VITAMIN D3) 25 MCG (1000 UT) CAPS Take 2 capsules (2,000 Units total) by mouth daily.   clopidogrel (PLAVIX) 75 MG tablet TAKE 1 TABLET(75 MG) BY MOUTH EVERY EVENING   diclofenac Sodium (VOLTAREN) 1 % GEL Apply 2 g topically 3 (three) times daily.   Fluticasone-Umeclidin-Vilant (TRELEGY ELLIPTA) 100-62.5-25 MCG/ACT AEPB Inhale 1 puff into the lungs daily.   Glucagon, rDNA, (GLUCAGON EMERGENCY) 1 MG KIT INJECT into THE muscle ONCE AS NEEDED FOR emergency   insulin degludec (TRESIBA FLEXTOUCH) 100 UNIT/ML FlexTouch Pen Inject 60 Units into the skin at bedtime. (Patient taking differently: Inject 56 Units into the skin at bedtime. 56 units daily as divided doses with meals)   insulin lispro (HUMALOG) 100 UNIT/ML injection Inject 5 Units into the skin 3 (three) times daily before meals.   isosorbide mononitrate (IMDUR) 30 MG 24 hr tablet Take 0.5 tablets (15 mg total) by mouth daily.   losartan (COZAAR) 25 MG tablet TAKE 1 TABLET(25 MG) BY MOUTH DAILY   metoprolol succinate (TOPROL-XL) 25 MG 24 hr tablet TAKE 1 TABLET(25 MG) BY MOUTH DAILY   Multiple Vitamin (MULTIVITAMIN ADULT) TABS Take 1 tablet by mouth in the morning and at bedtime.   nitroGLYCERIN (NITROSTAT) 0.4 MG SL tablet Place 1 tablet (0.4 mg total) under the tongue every 5 (five) minutes as needed for chest pain.   rosuvastatin (CRESTOR) 20 MG tablet Take 1 tablet (20 mg total) by mouth every evening.   SYNTHROID 75 MCG tablet TAKE ONE TABLET BY MOUTH Monday-Saturday  BEFORE breakfast   topiramate (TOPAMAX) 50 MG tablet Take 1 tablet (50 mg total) by mouth 2 (two) times daily. (Patient taking differently: Take 50 mg by mouth as needed.)   torsemide (DEMADEX) 20 MG tablet Take 2 tablets (40 mg total) by mouth daily.    Immunization History  Administered Date(s) Administered   Pneumococcal Conjugate-13 09/23/2014   Pneumococcal Polysaccharide-23 01/05/2010, 07/17/2013   Tdap 10/07/2011        Objective:   BP 122/80 (BP Location: Right Arm, Cuff Size: Normal)   Pulse 87   Temp (!) 97.1 F (36.2 C)   Ht 5\' 3"  (1.6 m)   Wt 155 lb (70.3 kg)   SpO2 97%   BMI 27.46 kg/m   SpO2: 97 % O2 Device: None (Room air)  GENERAL: Well-developed, well-nourished woman, no acute distress.  Fully ambulatory with no conversational dyspnea.  Nasal quality to speech.  No dysphonia noted today. HEAD: Normocephalic, atraumatic.  EYES: Pupils equal, round, reactive to  light.  No scleral icterus.  MOUTH: Oral mucosa moist.  No thrush. NECK: Supple. No thyromegaly. Trachea midline. No JVD.  No adenopathy. PULMONARY: Good air entry bilaterally.  No adventitious sounds. CARDIOVASCULAR: S1 and S2. Regular rate and rhythm.  No rubs, murmurs or gallops heard. ABDOMEN: Protuberant, otherwise benign. MUSCULOSKELETAL: No joint deformity, no clubbing, 1+ edema of lower extremities.  NEUROLOGIC: No overt focal deficit, no gait disturbance, speech is fluent. SKIN: Intact,warm,dry.  No overt rashes, exam limited. PSYCH: Behavior normal  Lab Results  Component Value Date   NITRICOXIDE 20 09/07/2023    Assessment & Plan:     ICD-10-CM   1. Mild persistent asthma without complication  J45.30 Nitric oxide    2. Shortness of breath  R06.02     3. OSA (obstructive sleep apnea)  G47.33    Mild, declined CPAP, declined O2      Orders Placed This Encounter  Procedures   Nitric oxide   Discussion:    Asthma Asthma is well-controlled with Trelegy and albuterol. She  uses Trelegy daily and albuterol 1-2 times per week. Reports adherence to medication regimen. Physical examination and inflammation levels indicate well-controlled asthma. Emphasized the importance of continued adherence to prevent exacerbations. - Continue Trelegy once daily - Continue albuterol as needed - Nitric Oxide level is 20 - Follow up in four months or sooner if symptoms worsen  Type 1 Diabetes Mellitus Significant difficulty in controlling blood glucose levels, with frequent hyperglycemia reaching 400 mg/dL. Considering resuming insulin pump use for better management. Discussed benefits of insulin pump therapy and risks of unmanaged hyperglycemia. - Discuss insulin pump options with her endocrinologist - Coordinate with endocrinologist Dr. Dell Ponto for insulin pump management  General Health Maintenance Unable to receive flu vaccine due to adverse reactions. Otherwise, proactive in managing health. - Monitor for any new health maintenance needs  Follow-up - Schedule follow-up in four months for asthma management.      Advised if symptoms do not improve or worsen, to please contact office for sooner follow up or seek emergency care.    I spent 32 minutes of dedicated to the care of this patient on the date of this encounter to include pre-visit review of records, face-to-face time with the patient discussing conditions above, post visit ordering of testing, clinical documentation with the electronic health record, making appropriate referrals as documented, and communicating necessary findings to members of the patients care team.     C. Danice Goltz, MD Advanced Bronchoscopy P[facility]    *This note was generated using voice recognition software/Dragon and/or AI transcription program.  Despite best efforts to proofread, errors can occur which can change the meaning. Any transcriptional errors that result from this process are unintentional and may not  be fully corrected at the time of dictation.

## 2023-09-22 NOTE — Progress Notes (Deleted)
PCP: Eustaquio Boyden, MD (last seen 09/24) Primary Cardiologist: Julien Nordmann, MD (last seen 06/24) HF provider: Dorthula Nettles, MD (last seen 03/24)  Chief Complaint:   HPI:  Cassandra Benson is a 82 yo female with a PMHx significant for HTN, HLD, hypothyroidism, CAD, LADA, HA's, fibromyalgia, PAD, vasospasm, Sjogren's syndrome, and CHF.   Was in the ED 11/16/22 due to SOB due to HF exacerbation. Was in the ED 01/13/23 due to SOB/ pedal edema. Elevated D-dimer. VQ scan negative for PE. Was in the ED 03/14/23 due to chest pain that woke her from sleep associated with chest pressure and heaviness in both sides of her chest. EKG, CXR and labs were unremarkable and she was released with improvement of pain.   Echo 07/30/20: EF of 55-60% with grade II diastolic dysfunction. Echo 12/24/21: EF of 60-65% along with mild MR.  Echo 12/23/22: EF 60-65% with Grade I DD.   RHC 12/02/22: No critical coronary artery disease. The is up to ~50% stenosis at the ostium of the RCA with pressure dampening noted using 28F diagnostic catheter.  Slight improvement noted with intracoronary nitroglycerin suggesting at least some component of vasospasm.  No angiographically significant coronary artery disease noted in the left coronary artery. Normal left and right heart filling pressures. Normal Fick cardiac output/index. Small right radial artery with significant vasospasm throughout the procedure.  Recommend alternate access for future catheterizations. Recommendations: Add isosorbide mononitrate 15 mg daily and as needed sublingual nitroglycerin for antianginal therapy, including possible coronary vasospasm.  She presents today for a HF f/u visit with a chief complaint of moderate SOB with minimal exertion. Chronic in nature. Has associated intermittent sharp chest pain, fatigue, palpitations (increasing frequency), dry cough, intermittent abdominal pain and intermittent dizziness along with this. Denies abdominal  distention, pedal edema, weight gain or difficulty sleeping.   Weight is stable. Does walk 3 blocks to mailroom and 3 blocks back to home.   At last visit, diuretic was changed to torsemide 40mg  daily & she feels like she is doing better since that change.  ROS: All systems negative except as listed in HPI, PMH and Problem List.  SH:  Social History   Socioeconomic History   Marital status: Widowed    Spouse name: Not on file   Number of children: 2   Years of education: Not on file   Highest education level: Not on file  Occupational History   Occupation: retired  Tobacco Use   Smoking status: Never   Smokeless tobacco: Never  Vaping Use   Vaping status: Never Used  Substance and Sexual Activity   Alcohol use: No   Drug use: No   Sexual activity: Not on file  Other Topics Concern   Not on file  Social History Narrative   Lives in [place], moved from [place].    Widow - husband decreased 01/2016 of metastatic colon CA   No pets.   Son Armanii Fietz lives nearby. Daughter lives in [place]. Sister lives 2 blocks away.    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, [employer]   Exercise - limited   Diet - good water, fruits/vegetables daily, limited meat, protein drink every morning   Social Drivers of Corporate investment banker Strain: Low Risk  (09/27/2022)   Overall Financial Resource Strain (CARDIA)    Difficulty of Paying Living Expenses: Not hard at all  Food Insecurity: No  Food Insecurity (09/27/2022)   Hunger Vital Sign    Worried About Running Out of Food in the Last Year: Never true    Ran Out of Food in the Last Year: Never true  Transportation Needs: No Transportation Needs (09/27/2022)   PRAPARE - Administrator, Civil Service (Medical): No    Lack of Transportation (Non-Medical): No  Physical Activity: Insufficiently Active (09/27/2022)   Exercise Vital Sign    Days of  Exercise per Week: 3 days    Minutes of Exercise per Session: 30 min  Stress: No Stress Concern Present (09/27/2022)   [employer] of Occupational Health - Occupational Stress Questionnaire    Feeling of Stress : Not at all  Social Connections: Moderately Integrated (09/27/2022)   Social Connection and Isolation Panel [NHANES]    Frequency of Communication with Friends and Family: More than three times a week    Frequency of Social Gatherings with Friends and Family: More than three times a week    Attends Religious Services: More than 4 times per year    Active Member of [employer] or Organizations: Yes    Attends Banker Meetings: More than 4 times per year    Marital Status: Widowed  Intimate Partner Violence: Not At Risk (09/27/2022)   Humiliation, Afraid, Rape, and Kick questionnaire    Fear of Current or Ex-Partner: No    Emotionally Abused: No    Physically Abused: No    Sexually Abused: No    FH:  Family History  Problem Relation Age of Onset   CAD Mother 49       MI, aortic valve issues   COPD Mother    Lupus Mother    Luiz Blare' disease Mother    Rheum arthritis Mother    CAD Father 66       CABG x2, aortic valve replacement   Stroke Sister    CAD Sister    Anuerysm Sister        brain   Lupus Sister    Diabetes Sister    Diabetes Sister    Breast cancer Sister    Alcohol abuse Brother    CAD Brother 30       MI   Stroke Brother    COPD Brother        agent orange   CAD Brother 60       stent   Diabetes Brother    Stroke Maternal Grandmother    Hypertension Maternal Grandmother    Gallbladder disease Maternal Grandmother    Breast cancer Maternal Aunt    Breast cancer Maternal Aunt    Depression Grandchild    Colon cancer Neg Hx    Esophageal cancer Neg Hx    Rectal cancer Neg Hx    Stomach cancer Neg Hx     Past Medical History:  Diagnosis Date   Acute diverticulitis 04/2021   [facility] ER, CT confirmed   Allergy    ANA positive     positive ANA pattern 1 speckled   Arthritis    Carotid stenosis, asymptomatic 06/19/2015   1-39% RICA 40-59% LICA rpt 1 yr (05/2015)    CHF (congestive heart failure) (HCC)    Colon polyps    COVID-19 virus infection 09/14/2021   Dermatomyositis (HCC)    Diabetes mellitus without complication (HCC)    Type 1   Diverticulosis    sigmoid on CT scan 12/2019   Family history of adverse reaction to anesthesia  brothr went into cardiac arrest from anectine   Fibromyalgia    prior PCP   GERD (gastroesophageal reflux disease)    prior PCP   Glaucoma    Narrow angle   History of blood clots    DVT, in 20s, none since   History of chicken pox    History of diverticulitis    History of pericarditis 1986   with hospitalization   History of pneumonia 2014   History of shingles    History of UTI    Hyperlipidemia    Hypertension    Hypothyroidism    Mixed connective tissue disease (HCC)    Partial small bowel obstruction (HCC) 12/2019   managed conservatively   Peptic ulcer    Pneumonia    PONV (postoperative nausea and vomiting)    Raynaud's disease without gangrene    Shoulder pain left   h/o RTC tendonitis and adhesive capsulitis   Sigmoid diverticulitis 05/26/2021   Sjogren's syndrome (HCC)    Sleep apnea    prior PCP - no CPAP for about 10 yrs   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HCC)    Vitamin D deficiency    prior PCP    Current Outpatient Medications  Medication Sig Dispense Refill   acetaminophen (TYLENOL) 500 MG tablet Take 1,000 mg by mouth daily.     albuterol (VENTOLIN HFA) 108 (90 Base) MCG/ACT inhaler Inhale 2 puffs into the lungs every 6 (six) hours as needed. 8 g 2   Ascorbic Acid (VITAMIN C) 100 MG tablet Take 100 mg by mouth daily.     aspirin EC 81 MG tablet Take 1 tablet (81 mg total) by mouth daily. Swallow whole. 30 tablet 12   Baclofen 5 MG TABS Take 1-2 tablets (5-10 mg total) by mouth at bedtime as needed (neck pain). 30 tablet 3   Cholecalciferol (VITAMIN D3)  25 MCG (1000 UT) CAPS Take 2 capsules (2,000 Units total) by mouth daily.     clopidogrel (PLAVIX) 75 MG tablet TAKE 1 TABLET(75 MG) BY MOUTH EVERY EVENING 30 tablet 11   diclofenac Sodium (VOLTAREN) 1 % GEL Apply 2 g topically 3 (three) times daily. 100 g 1   Fluticasone-Umeclidin-Vilant (TRELEGY ELLIPTA) 100-62.5-25 MCG/ACT AEPB Inhale 1 puff into the lungs daily. 28 each 11   Glucagon, rDNA, (GLUCAGON EMERGENCY) 1 MG KIT INJECT into THE muscle ONCE AS NEEDED FOR emergency 1 kit 12   insulin degludec (TRESIBA FLEXTOUCH) 100 UNIT/ML FlexTouch Pen Inject 60 Units into the skin at bedtime. (Patient taking differently: Inject 56 Units into the skin at bedtime. 56 units daily as divided doses with meals) 54 mL 1   insulin lispro (HUMALOG) 100 UNIT/ML injection Inject 5 Units into the skin 3 (three) times daily before meals.     isosorbide mononitrate (IMDUR) 30 MG 24 hr tablet Take 0.5 tablets (15 mg total) by mouth daily. 30 tablet 6   losartan (COZAAR) 25 MG tablet TAKE 1 TABLET(25 MG) BY MOUTH DAILY 90 tablet 3   metoprolol succinate (TOPROL-XL) 25 MG 24 hr tablet TAKE 1 TABLET(25 MG) BY MOUTH DAILY 30 tablet 5   Multiple Vitamin (MULTIVITAMIN ADULT) TABS Take 1 tablet by mouth in the morning and at bedtime.     nitroGLYCERIN (NITROSTAT) 0.4 MG SL tablet Place 1 tablet (0.4 mg total) under the tongue every 5 (five) minutes as needed for chest pain. 25 tablet PRN   rosuvastatin (CRESTOR) 20 MG tablet Take 1 tablet (20 mg total) by mouth every evening. 90 tablet  4   SYNTHROID 75 MCG tablet TAKE ONE TABLET BY MOUTH Monday-Saturday BEFORE breakfast 80 tablet 4   topiramate (TOPAMAX) 50 MG tablet Take 1 tablet (50 mg total) by mouth 2 (two) times daily. (Patient taking differently: Take 50 mg by mouth as needed.) 60 tablet 12   torsemide (DEMADEX) 20 MG tablet Take 2 tablets (40 mg total) by mouth daily. 60 tablet 5   No current facility-administered medications for this visit.   There were no vitals  filed for this visit.  Wt Readings from Last 3 Encounters:  09/07/23 155 lb (70.3 kg)  08/07/23 157 lb 4 oz (71.3 kg)  06/20/23 155 lb 3.2 oz (70.4 kg)   Lab Results  Component Value Date   CREATININE 0.92 07/20/2023   CREATININE 0.83 03/14/2023   CREATININE 0.93 01/13/2023    PHYSICAL EXAM:  General:  Well appearing. No resp difficulty HEENT: normal Neck: supple. JVP flat. No lymphadenopathy or thryomegaly appreciated. Cor: PMI normal. Regular rate & rhythm. No rubs, gallops or murmurs. Lungs: clear Abdomen: soft, nontender, nondistended. No hepatosplenomegaly. No bruits or masses. Extremities: no cyanosis, clubbing, rash, trace pitting edema bilateral lower legs Neuro: alert & oriented x3, cranial nerves grossly intact. Moves all 4 extremities w/o difficulty. Affect pleasant.   ECG: 03/23/23 showed NSR with HR 78   ASSESSMENT & PLAN:  NICM with preserved EF- - likely d/t HTN as cath showed no critical CAD - NYHA III - euvolemic - weighing daily; reminded to call for an overnight weight gain of >2 pounds or a weekly weight gain of >5 pounds - weight down 1 pound from last visit here 2 months ago - Echo 07/30/20: EF of 55-60% with grade II diastolic dysfunction.  - Echo 1/61/09: EF of 60-65% along with mild MR.  - Echo 12/23/22: EF 60-65% with Grade I DD.  - RHC 12/02/22: No critical coronary artery disease.  The is up to ~50% stenosis at the ostium of the RCA with pressure dampening noted using 11F diagnostic catheter.  Slight improvement noted with intracoronary nitroglycerin suggesting at least some component of vasospasm.  No angiographically significant coronary artery disease noted in the left coronary artery. Normal left and right heart filling pressures. Normal Fick cardiac output/index. Small right radial artery with significant vasospasm throughout the procedure.  Recommend alternate access for future catheterizations. - adhering to low sodium diet and fluid  restriction - continue losartan 25mg  daily - continue metoprolol succinate 25mg  daily PRN - continue torsemide 40mg  daily - valsartan caused hacking cough - can not add SGLT2 due to LADA - saw HF provider Gasper Lloyd) 03/24 - saw cardiology Mariah Milling) 06/24 - BNP 01/13/23 was 74.4 - PharmD reconciled meds with patient  2. HTN - BP 126/64 - saw PCP Sharen Hones) 09/24 - BMP 03/14/23 reviewed: sodium 137, potassium 3.9, creatinine 0.83 & GFR >60   3. LADA - saw endocrinology Dell Ponto) 09/24 - A1c 06/12/23 was 9.7%  4: Mild obstructive sleep apnea - saw pulmonology Jayme Cloud) 09/24 - home sleep study done 09/22/22 which showed mild obstructive sleep apnea with AHI of 8.5 and oxygen saturations as low as 82% nocturnally. Patient declined CPAP/oxygen.  - PFT's 04/22/22  Return in 3 months, sooner if needed.

## 2023-09-25 ENCOUNTER — Encounter: Payer: PPO | Admitting: Family

## 2023-09-25 ENCOUNTER — Telehealth: Payer: Self-pay | Admitting: Family

## 2023-09-25 NOTE — Telephone Encounter (Signed)
Patient did not show for her Heart Failure Clinic appointment on 09/25/23.

## 2023-10-03 ENCOUNTER — Telehealth: Payer: Self-pay

## 2023-10-03 ENCOUNTER — Telehealth: Payer: Self-pay | Admitting: Family Medicine

## 2023-10-03 NOTE — Telephone Encounter (Addendum)
 Placed back in Lisa's box. I don't prescribe insulin  pumps. This needs to go to her endocrinologist Dr Romero Maillard at  [facility]  [address]  [facility] [address]  [place], [place] [address]  Phone: [phone]  Fax: [phone]

## 2023-10-03 NOTE — Telephone Encounter (Signed)
 Copied from CRM [id number]. Topic: Clinical - Medical Advice >> Oct 03, 2023 10:24 AM Eleanor C wrote: Reason for CRM: patient missed call from doctor, patient wanted to let doctor know that she was supposed to contact her physician to let them know that she has been diagnosed with congestive heart failure.

## 2023-10-03 NOTE — Telephone Encounter (Signed)
Received faxed order.  Placed in Dr. Synthia Innocent box.

## 2023-10-03 NOTE — Telephone Encounter (Signed)
 Faxed was received and sent to Dr. Talmadge RAMAN- drive.   Copied from CRM [id number]. Topic: Clinical - Prescription Issue >> Oct 03, 2023  3:11 PM Corean SAUNDERS wrote: Reason for CRM: Dedra from [employer] she will be re-faxing a request for Dr. Rilla regarding patients Omni Pod 5 Dedra states she first faxed the form on 12/24

## 2023-10-03 NOTE — Telephone Encounter (Signed)
Noted. Will see on Friday

## 2023-10-06 ENCOUNTER — Telehealth: Payer: Self-pay | Admitting: Family Medicine

## 2023-10-06 ENCOUNTER — Telehealth: Payer: PPO | Admitting: Family Medicine

## 2023-10-06 NOTE — Telephone Encounter (Signed)
 Looks like someone may have already retrieved order from my box.

## 2023-10-06 NOTE — Telephone Encounter (Signed)
 Copied from CRM [id number]. Topic: General - Other >> Oct 05, 2023  3:58 PM Viola F wrote: Reason for CRM: Patient daughter called, needs a letter for post master for patient to have a drop box and her front door so she doesn't have to walk to far. Would like letter at her appointment 10/06/23 @ 4pm

## 2023-10-09 ENCOUNTER — Other Ambulatory Visit: Payer: PPO

## 2023-10-09 ENCOUNTER — Ambulatory Visit (INDEPENDENT_AMBULATORY_CARE_PROVIDER_SITE_OTHER): Payer: PPO | Admitting: Family Medicine

## 2023-10-09 ENCOUNTER — Encounter: Payer: Self-pay | Admitting: Family Medicine

## 2023-10-09 VITALS — BP 160/74 | HR 92 | Temp 97.6°F | Ht 63.0 in | Wt 148.1 lb

## 2023-10-09 DIAGNOSIS — I10 Essential (primary) hypertension: Secondary | ICD-10-CM

## 2023-10-09 DIAGNOSIS — J4530 Mild persistent asthma, uncomplicated: Secondary | ICD-10-CM

## 2023-10-09 DIAGNOSIS — R519 Headache, unspecified: Secondary | ICD-10-CM | POA: Diagnosis not present

## 2023-10-09 DIAGNOSIS — G8929 Other chronic pain: Secondary | ICD-10-CM

## 2023-10-09 DIAGNOSIS — R0609 Other forms of dyspnea: Secondary | ICD-10-CM | POA: Diagnosis not present

## 2023-10-09 DIAGNOSIS — G3184 Mild cognitive impairment, so stated: Secondary | ICD-10-CM | POA: Diagnosis not present

## 2023-10-09 DIAGNOSIS — E785 Hyperlipidemia, unspecified: Secondary | ICD-10-CM

## 2023-10-09 DIAGNOSIS — E1169 Type 2 diabetes mellitus with other specified complication: Secondary | ICD-10-CM | POA: Diagnosis not present

## 2023-10-09 DIAGNOSIS — J45909 Unspecified asthma, uncomplicated: Secondary | ICD-10-CM | POA: Insufficient documentation

## 2023-10-09 DIAGNOSIS — E13319 Other specified diabetes mellitus with unspecified diabetic retinopathy without macular edema: Secondary | ICD-10-CM

## 2023-10-09 DIAGNOSIS — I201 Angina pectoris with documented spasm: Secondary | ICD-10-CM

## 2023-10-09 DIAGNOSIS — I5032 Chronic diastolic (congestive) heart failure: Secondary | ICD-10-CM | POA: Diagnosis not present

## 2023-10-09 LAB — COMPREHENSIVE METABOLIC PANEL
ALT: 20 U/L (ref 0–35)
AST: 20 U/L (ref 0–37)
Albumin: 4.6 g/dL (ref 3.5–5.2)
Alkaline Phosphatase: 97 U/L (ref 39–117)
BUN: 14 mg/dL (ref 6–23)
CO2: 28 meq/L (ref 19–32)
Calcium: 10.2 mg/dL (ref 8.4–10.5)
Chloride: 96 meq/L (ref 96–112)
Creatinine, Ser: 0.96 mg/dL (ref 0.40–1.20)
GFR: 55.58 mL/min — ABNORMAL LOW (ref >60.00)
Glucose, Bld: 421 mg/dL — ABNORMAL HIGH (ref 70–99)
Potassium: 4 meq/L (ref 3.5–5.1)
Sodium: 136 meq/L (ref 135–145)
Total Bilirubin: 0.5 mg/dL (ref 0.2–1.2)
Total Protein: 7 g/dL (ref 6.0–8.3)

## 2023-10-09 LAB — LIPID PANEL
Cholesterol: 217 mg/dL — ABNORMAL HIGH (ref 0–200)
HDL: 53.5 mg/dL (ref >39.00)
LDL Cholesterol: 126 mg/dL — ABNORMAL HIGH (ref 0–99)
NonHDL: 163.72
Total CHOL/HDL Ratio: 4
Triglycerides: 189 mg/dL — ABNORMAL HIGH (ref 0.0–149.0)
VLDL: 37.8 mg/dL (ref 0.0–40.0)

## 2023-10-09 LAB — VITAMIN B12: Vitamin B-12: 667 pg/mL (ref 211–911)

## 2023-10-09 LAB — TSH: TSH: 1.9 u[IU]/mL (ref 0.35–5.50)

## 2023-10-09 MED ORDER — LOSARTAN POTASSIUM 50 MG PO TABS: 50.0000 mg | ORAL_TABLET | Freq: Every day | ORAL | 3 refills | Status: DC

## 2023-10-09 NOTE — Assessment & Plan Note (Signed)
 Chronic exertional dyspnea followed by pulm and cardiology. Letter for postmaster written today, will mail to patient.

## 2023-10-09 NOTE — Patient Instructions (Addendum)
 Call to reschedule follow up appointment with CHF clinic in [place] [phone] [facility])  Memory testing was suspicious for mild memory difficulty - I'd like to check further labwork today and will see if we can schedule neuropsychological evaluation.  Return to see Dr Lane for memory evaluation.  Good to see you today.  Return next month for previously scheduled appointment - bring in all your medicines to that appointment.   Labs today. Depending on how cholesterol levels are, we may drop Crestor  dose.  Stop baclofen .  Hold isosorbide  (Imdur ) 1/2 tablet which could cause headache.   Work on 4 core lifestyle modifications to support a healthy mind: 1. Nutritious well balance diet.  2. Regular physical activity routine.  3. Regular mental activity such as reading books, word puzzles, math puzzles, jigsaw puzzles.  4. Social engagement.  Also ensure good blood pressure and sugar control, limit alcohol, no smoking.

## 2023-10-09 NOTE — Telephone Encounter (Signed)
 Addressed during OV today

## 2023-10-09 NOTE — Assessment & Plan Note (Signed)
 Ongoing right sided headache previously attributed to insulin aspart.  Headache has returned in setting of elevated blood pressures -will increase losartan as per above.  Nonfocal neurological exam today

## 2023-10-09 NOTE — Assessment & Plan Note (Addendum)
 09/2023: MMSE 25/30 (misses 2 orientation, 2 calculation, 1 recall), 1/4 CDT.  Abnormal MMSE and CDT however independent in ADLs/IADLs.  Anticipate multifactorial including vascular etiology, suboptimal sugar control.  Discussed lifestyle strategies for healthy mind including physical activity, nutritious diet, mental exercise, social engagement. Discussed importance of BP and sugar control.  Discussed treatment options including neurology evaluation (she will return to see Dr Lane), medication for memory (aricept , namenda ), neuropsychological evaluation - will refer but discussed likely prolonged wait time.  Stop baclofen .  She is concerned statin may have contributed to memory difficulty - will update FLP and titrate accordingly.  Reviewed latest MRI from 02/2023 without acute process.  Update memory labs today.

## 2023-10-09 NOTE — Progress Notes (Signed)
 Ph: [phone] Fax: [phone]   Patient ID: Cassandra Benson, female    DOB: 1941-08-02, 83 y.o.   MRN: [id number]  This visit was conducted in person.  BP (!) 160/74 (BP Location: Right Arm, Cuff Size: Normal)   Pulse 92   Temp 97.6 F (36.4 C) (Oral)   Ht 5' 3 (1.6 m)   Wt 148 lb 2 oz (67.2 kg)   SpO2 97%   BMI 26.24 kg/m    CC: HA  Subjective:   HPI: Cassandra Benson is a 83 y.o. female presenting on 10/09/2023 for Headache (C/o HA. Started 10/07/23. Pt needs labs. Also, here for memory problem and mental status. Pt's daughter, Kate, wants to be on speakerphone during today's OV. )   2 days ago had episodes of headache and chest pain. She took nitroglycerin  with benefit. She missed recent CHF clinic appt 09/25/2023. # provided to reschedule appt.   Recent concerns regarding possible memory difficulty - some trouble getting lost for several hours while driving home from local son's house during Thanksgiving, ending up in [place], as well as concern over someone named Daryll coming out to her house.   Requests letter for postmaster as per recent phone note:  Patient daughter called, needs a letter for post master for patient to have a drop box at her front door so she doesn't have to walk that far  Currently has mailbox 3 blocks away.  She is very shortwinded when she has to go to mailbox due to asthma and CHF.   She saw Dr Tamea pulmonology on 09/07/2023 for mild persistent asthma, shortness of breath, and mild OSA - pt declined CPAP and oxygen. Continues Trelegy 1 puff daily and albuterol  PRN for asthma - overall well controlled on latest evaluation.   Geriatric Assessment: Activities of Daily Living:     Bathing- independent    Dressing- independent    Eating- independent    Toileting- independent    Transferring- independent    Continence- independent Overall Assessment: independent  Instrumental Activities of Daily Living:      Transportation- partially dependent (friend neighbor)    Meal/Food Preparation- independent    Shopping Errands- independent    Housekeeping/Chores- independent    Money Management/Finances- independent    Medication Management- independent    Ability to Use Telephone- independent    Laundry- independent Overall Assessment: independent  Mental Status Exam: (value/max value): 25/30 Clock Drawing Score: 1/4      Relevant past medical, surgical, family and social history reviewed and updated as indicated. Interim medical history since our last visit reviewed. Allergies and medications reviewed and updated. Outpatient Medications Prior to Visit  Medication Sig Dispense Refill   acetaminophen  (TYLENOL ) 500 MG tablet Take 1,000 mg by mouth daily.     albuterol  (VENTOLIN  HFA) 108 (90 Base) MCG/ACT inhaler Inhale 2 puffs into the lungs every 6 (six) hours as needed. 8 g 2   Ascorbic Acid  (VITAMIN C ) 100 MG tablet Take 100 mg by mouth daily.     aspirin  EC 81 MG tablet Take 1 tablet (81 mg total) by mouth daily. Swallow whole. 30 tablet 12   Cholecalciferol  (VITAMIN D3) 25 MCG (1000 UT) CAPS Take 2 capsules (2,000 Units total) by mouth daily.     clopidogrel  (PLAVIX ) 75 MG tablet TAKE 1 TABLET(75 MG) BY MOUTH EVERY EVENING 30 tablet 11   diclofenac  Sodium (VOLTAREN ) 1 % GEL Apply 2 g topically 3 (three) times daily. 100 g 1  Fluticasone-Umeclidin-Vilant (TRELEGY ELLIPTA ) 100-62.5-25 MCG/ACT AEPB Inhale 1 puff into the lungs daily. 28 each 11   Glucagon , rDNA, (GLUCAGON  EMERGENCY) 1 MG KIT INJECT into THE muscle ONCE AS NEEDED FOR emergency 1 kit 12   insulin  degludec (TRESIBA  FLEXTOUCH) 100 UNIT/ML FlexTouch Pen Inject 60 Units into the skin at bedtime. (Patient taking differently: Inject 56 Units into the skin at bedtime. 56 units daily as divided doses with meals) 54 mL 1   insulin  lispro (HUMALOG ) 100 UNIT/ML injection Inject 5 Units into the skin 3 (three) times daily before meals.      isosorbide  mononitrate (IMDUR ) 30 MG 24 hr tablet Take 0.5 tablets (15 mg total) by mouth daily. 30 tablet 6   metoprolol  succinate (TOPROL -XL) 25 MG 24 hr tablet TAKE 1 TABLET(25 MG) BY MOUTH DAILY 30 tablet 5   Multiple Vitamin (MULTIVITAMIN ADULT) TABS Take 1 tablet by mouth in the morning and at bedtime.     nitroGLYCERIN  (NITROSTAT ) 0.4 MG SL tablet Place 1 tablet (0.4 mg total) under the tongue every 5 (five) minutes as needed for chest pain. 25 tablet PRN   rosuvastatin  (CRESTOR ) 20 MG tablet Take 1 tablet (20 mg total) by mouth every evening. 90 tablet 4   SYNTHROID  75 MCG tablet TAKE ONE TABLET BY MOUTH Monday-Saturday BEFORE breakfast 80 tablet 4   topiramate  (TOPAMAX ) 50 MG tablet Take 1 tablet (50 mg total) by mouth 2 (two) times daily. (Patient taking differently: Take 50 mg by mouth as needed.) 60 tablet 12   torsemide  (DEMADEX ) 20 MG tablet Take 2 tablets (40 mg total) by mouth daily. 60 tablet 5   Baclofen  5 MG TABS Take 1-2 tablets (5-10 mg total) by mouth at bedtime as needed (neck pain). 30 tablet 3   losartan  (COZAAR ) 25 MG tablet TAKE 1 TABLET(25 MG) BY MOUTH DAILY 90 tablet 3   No facility-administered medications prior to visit.     Per HPI unless specifically indicated in ROS section below Review of Systems  Objective:  BP (!) 160/74 (BP Location: Right Arm, Cuff Size: Normal)   Pulse 92   Temp 97.6 F (36.4 C) (Oral)   Ht 5' 3 (1.6 m)   Wt 148 lb 2 oz (67.2 kg)   SpO2 97%   BMI 26.24 kg/m   Wt Readings from Last 3 Encounters:  10/09/23 148 lb 2 oz (67.2 kg)  09/07/23 155 lb (70.3 kg)  08/07/23 157 lb 4 oz (71.3 kg)      Physical Exam Vitals and nursing note reviewed.  Constitutional:      Appearance: Normal appearance. She is not ill-appearing.  HENT:     Head: Normocephalic and atraumatic.     Mouth/Throat:     Mouth: Mucous membranes are moist.     Pharynx: Oropharynx is clear. No oropharyngeal exudate or posterior oropharyngeal erythema.  Eyes:      Extraocular Movements: Extraocular movements intact.     Pupils: Pupils are equal, round, and reactive to light.  Cardiovascular:     Rate and Rhythm: Normal rate and regular rhythm.     Pulses: Normal pulses.     Heart sounds: Normal heart sounds. No murmur heard. Pulmonary:     Effort: Pulmonary effort is normal. No respiratory distress.     Breath sounds: Normal breath sounds. No wheezing, rhonchi or rales.  Musculoskeletal:     Cervical back: Normal range of motion and neck supple.     Right lower leg: No edema.  Left lower leg: No edema.  Lymphadenopathy:     Cervical: No cervical adenopathy.  Skin:    General: Skin is warm and dry.     Findings: No rash.  Neurological:     General: No focal deficit present.     Mental Status: She is alert.     Comments:  CN 2-12 intact FTN intact EOMI  Psychiatric:        Mood and Affect: Mood normal.        Behavior: Behavior normal.       Results for orders placed or performed in visit on 10/09/23  Comprehensive metabolic panel   Collection Time: 10/09/23 12:50 PM  Result Value Ref Range   Sodium 136 135 - 145 mEq/L   Potassium 4.0 3.5 - 5.1 mEq/L   Chloride 96 96 - 112 mEq/L   CO2 28 19 - 32 mEq/L   Glucose, Bld 421 (H) 70 - 99 mg/dL   BUN 14 6 - 23 mg/dL   Creatinine, Ser 9.03 0.40 - 1.20 mg/dL   Total Bilirubin 0.5 0.2 - 1.2 mg/dL   Alkaline Phosphatase 97 39 - 117 U/L   AST 20 0 - 37 U/L   ALT 20 0 - 35 U/L   Total Protein 7.0 6.0 - 8.3 g/dL   Albumin  4.6 3.5 - 5.2 g/dL   GFR 44.41 (L) >39.99 mL/min   Calcium  10.2 8.4 - 10.5 mg/dL  TSH   Collection Time: 10/09/23 12:50 PM  Result Value Ref Range   TSH 1.90 0.35 - 5.50 uIU/mL  Vitamin B12   Collection Time: 10/09/23 12:50 PM  Result Value Ref Range   Vitamin B-12 667 211 - 911 pg/mL  Lipid panel   Collection Time: 10/09/23 12:50 PM  Result Value Ref Range   Cholesterol 217 (H) 0 - 200 mg/dL   Triglycerides 810.9 (H) 0.0 - 149.0 mg/dL   HDL 46.49 >60.99  mg/dL   VLDL 62.1 0.0 - 59.9 mg/dL   LDL Cholesterol 873 (H) 0 - 99 mg/dL   Total CHOL/HDL Ratio 4    NonHDL 163.72    *Note: Due to a large number of results and/or encounters for the requested time period, some results have not been displayed. A complete set of results can be found in Results Review.   Lab Results  Component Value Date   HGBA1C 10.0 (A) 06/06/2023    Assessment & Plan:   Problem List Items Addressed This Visit     Essential hypertension   Relevant Medications   losartan  (COZAAR ) 50 MG tablet   Hyperlipidemia associated with type 2 diabetes mellitus (HCC)   She is concerned statin may have contributed to memory difficulty - will update FLP and titrate accordingly.       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  Longstanding suboptimal control with latest A1c 10.0% (05/2023)      Relevant Medications   losartan  (COZAAR ) 50 MG tablet   Chronic right-sided headache   Ongoing right sided headache previously attributed to insulin  aspart.  Headache has returned in setting of elevated blood pressures -will increase losartan  as per above.  Nonfocal neurological exam today       Chronic dyspnea   Chronic exertional dyspnea followed by  pulm and cardiology. Letter for postmaster written today, will mail to patient.      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  Abnormal MMSE and CDT however independent in ADLs/IADLs.   Anticipate multifactorial including vascular etiology, suboptimal sugar control.  Discussed lifestyle strategies for healthy mind including physical activity, nutritious diet, mental exercise, social engagement. Discussed importance of BP and sugar control.  Discussed treatment options including neurology evaluation (she will return to see Dr Lane), medication for memory (aricept , namenda ), neuropsychological evaluation - will refer but discussed likely prolonged wait time.  Stop baclofen .  She is concerned statin may have contributed to memory difficulty - will update FLP and titrate accordingly.  Reviewed latest MRI from 02/2023 without acute process.  Update memory labs today.      Relevant Orders   Comprehensive metabolic panel (Completed)   TSH (Completed)   Vitamin B12 (Completed)   RPR   Ambulatory referral to Neuropsychology   Asthma   Appreciate pulmonology care        Meds ordered this encounter  Medications   losartan  (COZAAR ) 50 MG tablet    Sig: Take 1 tablet (50 mg total) by mouth daily.    Dispense:  90 tablet    Refill:  3    Note new dose    Orders Placed This Encounter  Procedures   Comprehensive metabolic panel   TSH   Vitamin B12   RPR   Lipid panel   Ambulatory referral to Neuropsychology    Referral Priority:   Routine    Referral Type:   Psychiatric    Referral Reason:   Specialty Services Required    Requested Specialty:   Psychology    Number of Visits Requested:   1    Patient Instructions  Call to reschedule follow up appointment with CHF clinic in [place] [phone] [facility])  Memory testing was suspicious for mild memory difficulty - I'd like to check further labwork today and will see if we can schedule neuropsychological evaluation.  Return to see Dr Lane for memory evaluation.  Good to see you today.  Return next month for previously scheduled appointment - bring in all your medicines to that appointment.   Labs  today. Depending on how cholesterol levels are, we may drop Crestor  dose.  Stop baclofen .  Hold isosorbide  (Imdur ) 1/2 tablet which could cause headache.   Work on 4 core lifestyle modifications to support a healthy mind: 1. Nutritious well balance diet.  2. Regular physical activity routine.  3. Regular mental activity such as reading books, word puzzles, math puzzles, jigsaw puzzles.  4. Social engagement.  Also ensure good blood pressure and sugar control, limit alcohol, no smoking.    Follow up plan: Return if symptoms worsen or fail to improve.  Anton Blas, MD

## 2023-10-10 NOTE — Assessment & Plan Note (Signed)
 She is concerned statin may have contributed to memory difficulty - will update FLP and titrate accordingly.

## 2023-10-10 NOTE — Assessment & Plan Note (Addendum)
 Appreciate endocrinology care on tresiba and sliding scale insulin.  Longstanding suboptimal control with latest A1c 10.0% (05/2023)

## 2023-10-10 NOTE — Assessment & Plan Note (Signed)
 Appreciate pulmonology care

## 2023-10-11 LAB — RPR: RPR Ser Ql: NONREACTIVE

## 2023-10-13 ENCOUNTER — Encounter: Payer: Self-pay | Admitting: Family Medicine

## 2023-10-29 ENCOUNTER — Other Ambulatory Visit: Payer: Self-pay | Admitting: Family Medicine

## 2023-10-29 DIAGNOSIS — E559 Vitamin D deficiency, unspecified: Secondary | ICD-10-CM

## 2023-10-29 DIAGNOSIS — E063 Autoimmune thyroiditis: Secondary | ICD-10-CM

## 2023-10-29 DIAGNOSIS — E13319 Other specified diabetes mellitus with unspecified diabetic retinopathy without macular edema: Secondary | ICD-10-CM

## 2023-10-29 DIAGNOSIS — E1169 Type 2 diabetes mellitus with other specified complication: Secondary | ICD-10-CM

## 2023-10-31 ENCOUNTER — Other Ambulatory Visit (INDEPENDENT_AMBULATORY_CARE_PROVIDER_SITE_OTHER): Payer: PPO

## 2023-10-31 DIAGNOSIS — E13319 Other specified diabetes mellitus with unspecified diabetic retinopathy without macular edema: Secondary | ICD-10-CM

## 2023-10-31 DIAGNOSIS — E559 Vitamin D deficiency, unspecified: Secondary | ICD-10-CM | POA: Diagnosis not present

## 2023-10-31 LAB — BASIC METABOLIC PANEL
BUN: 15 mg/dL (ref 6–23)
CO2: 26 meq/L (ref 19–32)
Calcium: 9.7 mg/dL (ref 8.4–10.5)
Chloride: 97 meq/L (ref 96–112)
Creatinine, Ser: 0.95 mg/dL (ref 0.40–1.20)
GFR: 56.26 mL/min — ABNORMAL LOW (ref >60.00)
Glucose, Bld: 431 mg/dL — ABNORMAL HIGH (ref 70–99)
Potassium: 4.3 meq/L (ref 3.5–5.1)
Sodium: 136 meq/L (ref 135–145)

## 2023-10-31 LAB — MICROALBUMIN / CREATININE URINE RATIO
Creatinine,U: 100.1 mg/dL
Microalb Creat Ratio: 6.7 mg/g (ref 0.0–30.0)
Microalb, Ur: 6.8 mg/dL — ABNORMAL HIGH (ref 0.0–1.9)

## 2023-10-31 LAB — VITAMIN D 25 HYDROXY (VIT D DEFICIENCY, FRACTURES): VITD: 37.53 ng/mL (ref 30.00–100.00)

## 2023-10-31 LAB — HEMOGLOBIN A1C: Hgb A1c MFr Bld: 13.8 % — ABNORMAL HIGH (ref 4.6–6.5)

## 2023-11-01 ENCOUNTER — Other Ambulatory Visit: Payer: PPO

## 2023-11-02 ENCOUNTER — Encounter: Payer: Self-pay | Admitting: *Deleted

## 2023-11-03 ENCOUNTER — Other Ambulatory Visit: Payer: Self-pay | Admitting: Vascular Surgery

## 2023-11-07 ENCOUNTER — Ambulatory Visit (INDEPENDENT_AMBULATORY_CARE_PROVIDER_SITE_OTHER): Payer: PPO

## 2023-11-07 VITALS — Ht 63.0 in | Wt 148.0 lb

## 2023-11-07 DIAGNOSIS — Z0000 Encounter for general adult medical examination without abnormal findings: Secondary | ICD-10-CM

## 2023-11-07 NOTE — Patient Instructions (Signed)
Cassandra Benson , Thank you for taking time to come for your Medicare Wellness Visit. I appreciate your ongoing commitment to your health goals. Please review the following plan we discussed and let me know if I can assist you in the future.   Referrals/Orders/Follow-Ups/Clinician Recommendations: none  This is a list of the screening recommended for you and due dates:  Health Maintenance  Topic Date Due   Zoster (Shingles) Vaccine (1 of 2) Never done   DTaP/Tdap/Td vaccine (2 - Td or Tdap) 10/06/2021   COVID-19 Vaccine (1 - 2024-25 season) Never done   Mammogram  03/09/2024   Eye exam for diabetics  04/19/2024   Hemoglobin A1C  04/29/2024   Complete foot exam   06/05/2024   Yearly kidney function blood test for diabetes  10/30/2024   Yearly kidney health urinalysis for diabetes  10/30/2024   Medicare Annual Wellness Visit  11/06/2024   Pneumonia Vaccine  Completed   DEXA scan (bone density measurement)  Completed   HPV Vaccine  Aged Out   Hepatitis C Screening  Discontinued    Advanced directives: (In Chart) A copy of your advanced directives are scanned into your chart should your provider ever need it.  Next Medicare Annual Wellness Visit scheduled for next year: Yes 11/07/2024 @ 11:30am telephone

## 2023-11-07 NOTE — Progress Notes (Signed)
Subjective:   Cassandra Benson is a 83 y.o. female who presents for Medicare Annual (Subsequent) preventive examination.  Visit Complete: Virtual I connected with  Cassandra Benson on 11/07/23 by a audio enabled telemedicine application and verified that I am speaking with the correct person using two identifiers.  Patient Location: Home  Provider Location: Office/Clinic  I discussed the limitations of evaluation and management by telemedicine. The patient expressed understanding and agreed to proceed.  Vital Signs: Because this visit was a virtual/telehealth visit, some criteria may be missing or patient reported. Any vitals not documented were not able to be obtained and vitals that have been documented are patient reported.  Patient Medicare AWV questionnaire was completed by the patient on 10/31/23; I have confirmed that all information answered by patient is correct and no changes since this date.  Cardiac Risk Factors include: advanced age (>64men, >61 women);diabetes mellitus;dyslipidemia;hypertension;sedentary lifestyle     Objective:    Today's Vitals   10/31/23 1343 11/07/23 1134  Weight:  148 lb (67.1 kg)  Height:  5\' 3"  (1.6 m)  PainSc: 8     Body mass index is 26.22 kg/m.     11/07/2023   11:55 AM 03/14/2023    9:42 AM 01/13/2023   11:50 AM 11/16/2022   11:07 AM 09/27/2022   11:30 AM 05/10/2022    9:57 AM 12/22/2021   12:08 PM  Advanced Directives  Does Patient Have a Medical Advance Directive? Yes Yes No No Yes Yes No  Type of Estate agent of [place];Living will Healthcare Power of [place];Out of facility DNR (pink MOST or yellow form)   Healthcare Power of [place];Living will Healthcare Power of [place];Living will   Does patient want to make changes to medical advance directive?     No - Patient declined    Copy of Healthcare Power of Attorney in Chart? Yes - validated most recent copy scanned in chart (See row information)     Yes - validated most recent copy scanned in chart (See row information)    Would patient like information on creating a medical advance directive?   No - Patient declined No - Patient declined       Current Medications (verified) Outpatient Encounter Medications as of 11/07/2023  Medication Sig   acetaminophen (TYLENOL) 500 MG tablet Take 1,000 mg by mouth daily.   albuterol (VENTOLIN HFA) 108 (90 Base) MCG/ACT inhaler Inhale 2 puffs into the lungs every 6 (six) hours as needed.   Ascorbic Acid (VITAMIN C) 100 MG tablet Take 100 mg by mouth daily.   aspirin EC 81 MG tablet Take 1 tablet (81 mg total) by mouth daily. Swallow whole.   Cholecalciferol (VITAMIN D3) 25 MCG (1000 UT) CAPS Take 2 capsules (2,000 Units total) by mouth daily.   clopidogrel (PLAVIX) 75 MG tablet TAKE 1 TABLET(75 MG) BY MOUTH EVERY EVENING   diclofenac Sodium (VOLTAREN) 1 % GEL Apply 2 g topically 3 (three) times daily.   Fluticasone-Umeclidin-Vilant (TRELEGY ELLIPTA) 100-62.5-25 MCG/ACT AEPB Inhale 1 puff into the lungs daily.   Glucagon, rDNA, (GLUCAGON EMERGENCY) 1 MG KIT INJECT into THE muscle ONCE AS NEEDED FOR emergency   insulin degludec (TRESIBA FLEXTOUCH) 100 UNIT/ML FlexTouch Pen Inject 60 Units into the skin at bedtime. (Patient taking differently: Inject 56 Units into the skin at bedtime. 56 units daily as divided doses with meals)   insulin lispro (HUMALOG) 100 UNIT/ML injection Inject 5 Units into the skin 3 (three) times daily before  meals.   isosorbide mononitrate (IMDUR) 30 MG 24 hr tablet Take 0.5 tablets (15 mg total) by mouth daily.   losartan (COZAAR) 50 MG tablet Take 1 tablet (50 mg total) by mouth daily.   metoprolol succinate (TOPROL-XL) 25 MG 24 hr tablet TAKE 1 TABLET(25 MG) BY MOUTH DAILY   Multiple Vitamin (MULTIVITAMIN ADULT) TABS Take 1 tablet by mouth in the morning and at bedtime.   nitroGLYCERIN (NITROSTAT) 0.4 MG SL tablet Place 1 tablet (0.4 mg total) under the tongue every 5 (five)  minutes as needed for chest pain.   rosuvastatin (CRESTOR) 20 MG tablet Take 1 tablet (20 mg total) by mouth every evening.   SYNTHROID 75 MCG tablet TAKE ONE TABLET BY MOUTH Monday-Saturday BEFORE breakfast   topiramate (TOPAMAX) 50 MG tablet Take 1 tablet (50 mg total) by mouth 2 (two) times daily. (Patient taking differently: Take 50 mg by mouth as needed.)   torsemide (DEMADEX) 20 MG tablet Take 2 tablets (40 mg total) by mouth daily.   No facility-administered encounter medications on file as of 11/07/2023.    Allergies (verified) Iodinated contrast media, Penicillins, Amlodipine, Anectine [succinylcholine], Carvedilol, Codeine, Gabapentin, Influenza vaccines, Insulin aspart (human analog), Nortriptyline, Prednisone, Valsartan, Zetia [ezetimibe], Erythromycin, and Sulfa antibiotics   History: Past Medical History:  Diagnosis Date   Acute diverticulitis 04/2021   [facility], CT confirmed   Allergy    ANA positive    positive ANA pattern 1 speckled   Arthritis    Carotid stenosis, asymptomatic 06/19/2015   1-39% RICA 40-59% LICA rpt 1 yr (05/2015)    CHF (congestive heart failure) (HCC)    Colon polyps    COVID-19 virus infection 09/14/2021   Dermatomyositis (HCC)    Diabetes mellitus without complication (HCC)    Type 1   Diverticulosis    sigmoid on CT scan 12/2019   Family history of adverse reaction to anesthesia    brothr went into cardiac arrest from anectine   Fibromyalgia    prior PCP   GERD (gastroesophageal reflux disease)    prior PCP   Glaucoma    Narrow angle   History of blood clots    DVT, in 20s, none since   History of chicken pox    History of diverticulitis    History of pericarditis 1986   with hospitalization   History of pneumonia 2014   History of shingles    History of UTI    Hyperlipidemia    Hypertension    Hypothyroidism    Mixed connective tissue disease (HCC)    Partial small bowel obstruction (HCC) 12/2019   managed conservatively    Peptic ulcer    Pneumonia    PONV (postoperative nausea and vomiting)    Raynaud's disease without gangrene    Shoulder pain left   h/o RTC tendonitis and adhesive capsulitis   Sigmoid diverticulitis 05/26/2021   Sjogren's syndrome (HCC)    Sleep apnea    prior PCP - no CPAP for about 10 yrs   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HCC)    Vitamin D deficiency    prior PCP   Past Surgical History:  Procedure Laterality Date   ABDOMINAL HYSTERECTOMY  1978   fibroids and menorrhagia, ovaries remain   ARTERY BIOPSY Right 04/06/2018   Procedure: BIOPSY TEMPORAL ARTERY RIGHT;  Surgeon: Linus Salmons, MD;  Location: [facility];  Service: ENT;  Laterality: Right;  Diabetic - insulin pump sleep apnea   CARDIAC CATHETERIZATION  2000   [facility] normal per patient   COLONOSCOPY  10/2011   1 TA, 1 HP, very tortuous colon ([place])   COLONOSCOPY  02/2020   TA, inflammatory polyp, mod diverticulosis, int/ext hemorrhoids ([place]) no rpt recommended    COLONOSCOPY WITH ESOPHAGOGASTRODUODENOSCOPY (EGD)  03/2007   2 ulcers, benign polyp, rpt 5 yrs [facility])   PARTIAL HIP ARTHROPLASTY Right 2013   hip replacement   RIGHT HEART CATH AND CORONARY ANGIOGRAPHY Bilateral 12/02/2022   Procedure: RIGHT HEART CATH AND CORONARY ANGIOGRAPHY;  Surgeon: Yvonne Kendall, MD;  Location: [facility];  Service: Cardiovascular;  Laterality: Bilateral;   TONSILLECTOMY     TONSILLECTOMY AND ADENOIDECTOMY     TRANSCAROTID ARTERY REVASCULARIZATION  Left 07/23/2018   Procedure: TRANSCAROTID ARTERY REVASCULARIZATION;  Surgeon: Cephus Shelling, MD;  Location: [facility] OR;  Service: Vascular;  Laterality: Left;   TUBAL LIGATION     VAGINAL DELIVERY     x2, no complications   Family History  Problem Relation Age of Onset   CAD Mother 26       MI, aortic valve issues   COPD Mother    Lupus Mother    Luiz Blare' disease Mother    Rheum arthritis Mother    CAD Father 40       CABG x2, aortic valve  replacement   Stroke Sister    CAD Sister    Anuerysm Sister        brain   Lupus Sister    Diabetes Sister    Diabetes Sister    Breast cancer Sister    Alcohol abuse Brother    CAD Brother 29       MI   Stroke Brother    COPD Brother        agent orange   CAD Brother 60       stent   Diabetes Brother    Stroke Maternal Grandmother    Hypertension Maternal Grandmother    Gallbladder disease Maternal Grandmother    Breast cancer Maternal Aunt    Breast cancer Maternal Aunt    Depression Grandchild    Colon cancer Neg Hx    Esophageal cancer Neg Hx    Rectal cancer Neg Hx    Stomach cancer Neg Hx    Social History   Socioeconomic History   Marital status: Widowed    Spouse name: Not on file   Number of children: 2   Years of education: Not on file   Highest education level: 12th grade  Occupational History   Occupation: retired  Tobacco Use   Smoking status: Never   Smokeless tobacco: Never  Vaping Use   Vaping status: Never Used  Substance and Sexual Activity   Alcohol use: No   Drug use: No   Sexual activity: Not on file  Other Topics Concern   Not on file  Social History Narrative   Lives in [place], moved from [place].    Widow - husband decreased 01/2016 of metastatic colon CA   No pets.   Son Torunn Chancellor lives nearby. Daughter lives in [place]. Sister lives 2 blocks away.    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, [employer]   Exercise - limited   Diet - good water, fruits/vegetables daily, limited meat, protein drink every morning   Social Drivers of Health   Financial Resource Strain: Medium Risk (10/31/2023)   Overall  Financial Resource Strain (CARDIA)    Difficulty of Paying Living Expenses: Somewhat hard  Food Insecurity: No Food Insecurity (10/31/2023)   Hunger Vital Sign    Worried About Running Out of Food in the Last Year: Never true    Ran Out of Food in  the Last Year: Never true  Transportation Needs: Unknown (10/31/2023)   PRAPARE - Transportation    Lack of Transportation (Medical): No    Lack of Transportation (Non-Medical): Patient declined  Physical Activity: Insufficiently Active (10/31/2023)   Exercise Vital Sign    Days of Exercise per Week: 3 days    Minutes of Exercise per Session: 20 min  Stress: No Stress Concern Present (10/31/2023)   [employer] of Occupational Health - Occupational Stress Questionnaire    Feeling of Stress : Only a little  Social Connections: Unknown (10/31/2023)   Social Connection and Isolation Panel [NHANES]    Frequency of Communication with Friends and Family: More than three times a week    Frequency of Social Gatherings with Friends and Family: More than three times a week    Attends Religious Services: More than 4 times per year    Active Member of [employer] or Organizations: Not on file    Attends Banker Meetings: Not on file    Marital Status: Widowed    Tobacco Counseling Counseling given: Not Answered  Clinical Intake:  Pre-visit preparation completed: Yes  Pain : 0-10 Pain Score: 8  Pain Type: Chronic pain Pain Location: Head Pain Orientation: Right Pain Descriptors / Indicators: Aching Pain Onset: More than a month ago Pain Frequency: Intermittent    BMI - recorded: 26.22 Nutritional Status: BMI 25 -29 Overweight Nutritional Risks: None Diabetes: Yes CBG done?: No Did pt. bring in CBG monitor from home?: No  How often do you need to have someone help you when you read instructions, pamphlets, or other written materials from your doctor or pharmacy?: 1 - Never  Interpreter Needed?: No  Comments: lives alone Information entered by :: B.Bradleigh Sonnen,LPN   Activities of Daily Living    10/31/2023    1:43 PM 12/02/2022    8:39 AM  In your present state of health, do you have any difficulty performing the following activities:  Hearing? 0 0  Vision? 1 0  Difficulty  concentrating or making decisions?  0  Walking or climbing stairs? 1 1  Dressing or bathing? 0 0  Doing errands, shopping? 0   Preparing Food and eating ? N   Using the Toilet? N   In the past six months, have you accidently leaked urine? N   Do you have problems with loss of bowel control? N   Managing your Medications? N   Managing your Finances? N   Housekeeping or managing your Housekeeping? Y     Patient Care Team: Eustaquio Boyden, MD as PCP - General (Family Medicine) Antonieta Iba, MD as PCP - Cardiology (Cardiology) Galen Manila, MD as Referring Physician (Ophthalmology) Salena Saner, MD as Consulting Physician (Pulmonary Disease)  Indicate any recent Medical Services you may have received from other than [facility] providers in the past year (date may be approximate).     Assessment:   This is a routine wellness examination for Cassandra Benson.  Hearing/Vision screen Hearing Screening - Comments:: Pt says her hearing is good Vision Screening - Comments:: Pt says her vision is good with glasses Dr Willey Blade   Goals Addressed  This Visit's Progress    Increase physical activity   Not on track    11/07/23, I will attempt to walk at least 30 minutes 3 days per week.      Maintain healthy lifestyle   On track    Stay active Healthy diet Good water intake     COMPLETED: Patient Stated   On track    Would like to maintain current routine.       Depression Screen    11/07/2023   11:49 AM 08/07/2023    9:36 AM 06/06/2023   10:06 AM 03/27/2023    8:02 AM 02/03/2023    8:24 AM 12/20/2022   10:18 AM 11/21/2022   11:15 AM  PHQ 2/9 Scores  PHQ - 2 Score 0 0 0 0 0 0 0  PHQ- 9 Score  4 5 2 2 1 1     Fall Risk    10/31/2023    1:43 PM 08/07/2023    9:36 AM 02/03/2023    8:24 AM 11/21/2022   11:15 AM 11/04/2022   10:25 AM  Fall Risk   Falls in the past year? 0 0 0 0 1  Number falls in past yr: 0    0  Injury with Fall? 0    0  Risk for fall due to  : No Fall Risks      Follow up Falls prevention discussed;Education provided        MEDICARE RISK AT HOME: Medicare Risk at Home Any stairs in or around the home?: (Patient-Rptd) No Home free of loose throw rugs in walkways, pet beds, electrical cords, etc?: (Patient-Rptd) No Adequate lighting in your home to reduce risk of falls?: (Patient-Rptd) Yes Life alert?: (Patient-Rptd) No Use of a cane, walker or w/c?: (Patient-Rptd) Yes Shower chair or bench in shower?: (Patient-Rptd) Yes Elevated toilet seat or a handicapped toilet?: (Patient-Rptd) Yes  TIMED UP AND GO:  Was the test performed?  No    Cognitive Function:    09/09/2020   10:55 AM 08/29/2018    8:50 AM 08/09/2016    8:42 AM  MMSE - Mini Mental State Exam  Orientation to time 5 5 5   Orientation to Place 5 5 5   Registration 3 3 3   Attention/ Calculation 5 0 0  Recall 3 3 3   Language- name 2 objects  0 0  Language- repeat 1 1 1   Language- follow 3 step command  3 3  Language- read & follow direction  0 0  Write a sentence  0 0  Copy design  0 0  Total score  20 20        11/07/2023   11:57 AM 09/27/2022   11:42 AM  6CIT Screen  What Year? 0 points 0 points  What month? 0 points 0 points  What time? 0 points 0 points  Count back from 20 0 points 0 points  Months in reverse 0 points 0 points  Repeat phrase 2 points 0 points  Total Score 2 points 0 points    Immunizations Immunization History  Administered Date(s) Administered   Pneumococcal Conjugate-13 09/23/2014   Pneumococcal Polysaccharide-23 01/05/2010, 07/17/2013   Tdap 10/07/2011    TDAP status: Up to date  Flu Vaccine status: Declined, Education has been provided regarding the importance of this vaccine but patient still declined. Advised may receive this vaccine at local pharmacy or Health Dept. Aware to provide a copy of the vaccination record if obtained from local pharmacy or Health Dept.  Verbalized acceptance and  understanding.  Pneumococcal vaccine status: Up to date  Covid-19 vaccine status: Declined, Education has been provided regarding the importance of this vaccine but patient still declined. Advised may receive this vaccine at local pharmacy or Health Dept.or vaccine clinic. Aware to provide a copy of the vaccination record if obtained from local pharmacy or Health Dept. Verbalized acceptance and understanding.  Qualifies for Shingles Vaccine? Yes   Zostavax completed No   Shingrix Completed?: No.    Education has been provided regarding the importance of this vaccine. Patient has been advised to call insurance company to determine out of pocket expense if they have not yet received this vaccine. Advised may also receive vaccine at local pharmacy or Health Dept. Verbalized acceptance and understanding.  Screening Tests Health Maintenance  Topic Date Due   Zoster Vaccines- Shingrix (1 of 2) Never done   DTaP/Tdap/Td (2 - Td or Tdap) 10/06/2021   COVID-19 Vaccine (1 - 2024-25 season) Never done   MAMMOGRAM  03/09/2024   OPHTHALMOLOGY EXAM  04/19/2024   HEMOGLOBIN A1C  04/29/2024   FOOT EXAM  06/05/2024   Diabetic kidney evaluation - eGFR measurement  10/30/2024   Diabetic kidney evaluation - Urine ACR  10/30/2024   Medicare Annual Wellness (AWV)  11/06/2024   Pneumonia Vaccine 84+ Years old  Completed   DEXA SCAN  Completed   HPV VACCINES  Aged Out   Hepatitis C Screening  Discontinued    Health Maintenance  Health Maintenance Due  Topic Date Due   Zoster Vaccines- Shingrix (1 of 2) Never done   DTaP/Tdap/Td (2 - Td or Tdap) 10/06/2021   COVID-19 Vaccine (1 - 2024-25 season) Never done    Colorectal cancer screening: No longer required.   Mammogram status: No longer required due to age.  Bone Density status: Completed 01/05/2022. Results reflect: Bone density results: OSTEOPENIA. Repeat every 3-5 years.  Lung Cancer Screening: (Low Dose CT Chest recommended if Age 34-80 years,  20 pack-year currently smoking OR have quit w/in 15years.) does not qualify.   Lung Cancer Screening Referral: no  Additional Screening:  Hepatitis C Screening: does not qualify; Completed no  Vision Screening: Recommended annual ophthalmology exams for early detection of glaucoma and other disorders of the eye. Is the patient up to date with their annual eye exam?  Yes  Who is the provider or what is the name of the office in which the patient attends annual eye exams? Dr Willey Blade If pt is not established with a provider, would they like to be referred to a provider to establish care? No .   Dental Screening: Recommended annual dental exams for proper oral hygiene  Diabetic Foot Exam: Diabetic Foot Exam: Completed 06/07/23  Community Resource Referral / Chronic Care Management: CRR required this visit?  No   CCM required this visit?  No    Plan:     I have personally reviewed and noted the following in the patient's chart:   Medical and social history Use of alcohol, tobacco or illicit drugs  Current medications and supplements including opioid prescriptions. Patient is not currently taking opioid prescriptions. Functional ability and status Nutritional status Physical activity Advanced directives List of other physicians Hospitalizations, surgeries, and ER visits in previous 12 months Vitals Screenings to include cognitive, depression, and falls Referrals and appointments  In addition, I have reviewed and discussed with patient certain preventive protocols, quality metrics, and best practice recommendations. A written personalized care plan for preventive services  as well as general preventive health recommendations were provided to patient.    Sue Lush, LPN   1/61/0960   After Visit Summary: (MyChart) Due to this being a telephonic visit, the after visit summary with patients personalized plan was offered to patient via MyChart   Nurse Notes: pt states  she does not feel good today. She relays she has not taken her blood sugars and needs to go to pharmacy and pick up "lancets?'. She talks much about her late husband and her having many health issues now. Pt denies any depression or suicidal ideation but still feels "blessed".

## 2023-11-07 NOTE — Telephone Encounter (Signed)
Ok to cancel and close if pt declined. We will review at upcoming appt. Thanks.

## 2023-11-07 NOTE — Telephone Encounter (Signed)
Noted

## 2023-11-07 NOTE — Telephone Encounter (Signed)
Dr Sharen Hones, patient declined Neuropsych referral.  See her message above.  Okay to cancel and close the referral at this time?

## 2023-11-08 ENCOUNTER — Ambulatory Visit: Payer: PPO | Admitting: Family Medicine

## 2023-11-08 ENCOUNTER — Encounter: Payer: Self-pay | Admitting: Family Medicine

## 2023-11-08 VITALS — BP 132/72 | HR 98 | Temp 97.8°F | Ht 62.75 in | Wt 148.4 lb

## 2023-11-08 DIAGNOSIS — G4733 Obstructive sleep apnea (adult) (pediatric): Secondary | ICD-10-CM

## 2023-11-08 DIAGNOSIS — I5032 Chronic diastolic (congestive) heart failure: Secondary | ICD-10-CM

## 2023-11-08 DIAGNOSIS — Z0001 Encounter for general adult medical examination with abnormal findings: Secondary | ICD-10-CM | POA: Diagnosis not present

## 2023-11-08 DIAGNOSIS — M3500 Sicca syndrome, unspecified: Secondary | ICD-10-CM | POA: Diagnosis not present

## 2023-11-08 DIAGNOSIS — R0609 Other forms of dyspnea: Secondary | ICD-10-CM

## 2023-11-08 DIAGNOSIS — I7090 Unspecified atherosclerosis: Secondary | ICD-10-CM | POA: Diagnosis not present

## 2023-11-08 DIAGNOSIS — G459 Transient cerebral ischemic attack, unspecified: Secondary | ICD-10-CM

## 2023-11-08 DIAGNOSIS — I6522 Occlusion and stenosis of left carotid artery: Secondary | ICD-10-CM

## 2023-11-08 DIAGNOSIS — E13319 Other specified diabetes mellitus with unspecified diabetic retinopathy without macular edema: Secondary | ICD-10-CM | POA: Diagnosis not present

## 2023-11-08 DIAGNOSIS — E785 Hyperlipidemia, unspecified: Secondary | ICD-10-CM

## 2023-11-08 DIAGNOSIS — I872 Venous insufficiency (chronic) (peripheral): Secondary | ICD-10-CM

## 2023-11-08 DIAGNOSIS — Z7984 Long term (current) use of oral hypoglycemic drugs: Secondary | ICD-10-CM | POA: Diagnosis not present

## 2023-11-08 DIAGNOSIS — I10 Essential (primary) hypertension: Secondary | ICD-10-CM | POA: Diagnosis not present

## 2023-11-08 DIAGNOSIS — G8929 Other chronic pain: Secondary | ICD-10-CM

## 2023-11-08 DIAGNOSIS — E559 Vitamin D deficiency, unspecified: Secondary | ICD-10-CM

## 2023-11-08 DIAGNOSIS — E063 Autoimmune thyroiditis: Secondary | ICD-10-CM | POA: Diagnosis not present

## 2023-11-08 DIAGNOSIS — E1169 Type 2 diabetes mellitus with other specified complication: Secondary | ICD-10-CM

## 2023-11-08 DIAGNOSIS — Z7189 Other specified counseling: Secondary | ICD-10-CM

## 2023-11-08 DIAGNOSIS — I739 Peripheral vascular disease, unspecified: Secondary | ICD-10-CM | POA: Diagnosis not present

## 2023-11-08 DIAGNOSIS — I7300 Raynaud's syndrome without gangrene: Secondary | ICD-10-CM | POA: Diagnosis not present

## 2023-11-08 DIAGNOSIS — J4540 Moderate persistent asthma, uncomplicated: Secondary | ICD-10-CM

## 2023-11-08 DIAGNOSIS — H3530 Unspecified macular degeneration: Secondary | ICD-10-CM

## 2023-11-08 DIAGNOSIS — M797 Fibromyalgia: Secondary | ICD-10-CM | POA: Diagnosis not present

## 2023-11-08 DIAGNOSIS — M85832 Other specified disorders of bone density and structure, left forearm: Secondary | ICD-10-CM

## 2023-11-08 DIAGNOSIS — G3184 Mild cognitive impairment, so stated: Secondary | ICD-10-CM

## 2023-11-08 DIAGNOSIS — I201 Angina pectoris with documented spasm: Secondary | ICD-10-CM

## 2023-11-08 NOTE — Assessment & Plan Note (Signed)
Stable period on Synthroid 6d/wk.

## 2023-11-08 NOTE — Assessment & Plan Note (Addendum)
Chronic, better control on higher losartan 50mg  dose however bag of medicines she brings has both 25mg  and 50mg  tablets - she will verify she's taking only total 50mg  daily.

## 2023-11-08 NOTE — Progress Notes (Signed)
Ph: [phone] Fax: [phone]   Patient ID: [name], female    DOB: 28-Jan-1941, 83 y.o.   MRN: [id number]  This visit was conducted in person.  BP 132/72   Pulse 98   Temp 97.8 F (36.6 C) (Oral)   Ht 5' 2.75" (1.594 m)   Wt 148 lb 6 oz (67.3 kg)   SpO2 98%   BMI 26.49 kg/m    CC: CPE Subjective:   HPI: Quinnetta Roepke is a 83 y.o. female presenting on 11/08/2023 for No chief complaint on file.   Saw health advisor 10/2023 for medicare wellness visit. Note reviewed.   No results found.  Flowsheet Row Clinical Support from 11/07/2023 in [facility] [place]  PHQ-2 Total Score 0          10/31/2023    1:43 PM 08/07/2023    9:36 AM 02/03/2023    8:24 AM 11/21/2022   11:15 AM 11/04/2022   10:25 AM  Fall Risk   Falls in the past year? 0 0 0 0 1  Number falls in past yr: 0    0  Injury with Fall? 0    0  Risk for fall due to : No Fall Risks      Follow up Falls prevention discussed;Education provided       Last seen 09/2023 with memory concerns thought MCI with MMSE 25/30, 1/4 CDT - multifactorial including vascular contribution and chronically sub-optimal sugar control. At that time I referred for neuropsychological evaluation (she declined appt when called to schedule - but it seems it was mistakenly sent to psychiatry, not neurology) and recommended return to neurology Dr Malvin Johns (she remains hesitant to reschedule appt). We stopped baclofen. I was hesitant to change statin due to cardiovascular risk factors. She notes she has again gotten lost recently when driving to see eye doctor. Discussed - she is willing to schedule neuropsychology eval and possibly return to neurology.   Last visit we also increased losartan to 50mg  daily due to elevated BP readings. Headache is somewhat improved with better BP control.   Asthma followed by pulm - continues Trelegy and PRN albuterol. NO2 level 20.   HST through pulm returned  showing OSA with O2 drop to 82%, she has not been using autoCPAP - states she needs new machine.   Chronic difficult to control diabetes on tresiba and sliding scale insulin, sees [facility] endo Dr Dell Ponto last seen 05/2023, upcoming appt 12/11/2023. Discussing insulin pump. Possible T1DM vs LADA. Per last endo note, was on tresiba 28u daily and lispro 5u with meals. States she's taking tresiba 30u BID as well as lispro 15u with meals. Not using DexCom G7 - waiting for the past 3 weeks on delivery.  Recent inulin dosing:  Tresiba: yesterday - 60u 9am, 60u 9pm, none today (at 11am) Lispro: yesterday - 30u with breakfast, 15u with lunch, 15u with dinner, none today.   Missed CHF clinic appt 09/25/2023 - needs to reschedule. # provided to reschedule appt.  Vascular surgery f/u later this week cancelled by provider.   Care Team: [facility] End[facility] - Dr Kiester Bing Neurology - Dr Malvin Johns [facility] Pulmonology - Dr Emilio Aspen Eye - Dr Willey Blade Cardiology - Dr Mariah Milling , also sees [facility]F clinic Dr Suzi Roots NP VVS - Dr Chestine Spore  Preventative: COLONOSCOPY 10/2011 - 1 TA, 1 HP, very tortuous colon ([place] [facility]) Colonoscopy (02/2020) - TA, inflammatory polyp, mod diverticulosis, int/ext hemorrhoids (  [place]) no rpt recommended  Mammo 02/2023 - Birads1 @ [place].  Well woman exam - last 2011-12-19 s/p abd hysterectomy, ovaries remain. Denies pelvic symptoms  DEXA 12/2021 - T -2.3 L forearm, normal FRAX fracture risk.  Flu shot - ALLERGIC COVID vaccine - declined Tdap 07/2012.  Pneumovax 2014, prevnar-13 2015.  Shingrix - declines. Has had shingles.  Advanced directive: forms were scanned (08/2017) - HCPOA are son Tawnya Pujol and Pam Drown. Does not want prolonged life support if terminal condition. HCPOA has authority to override directives.  Seat belt use discussed  Sunscreen use discussed, no changing moles on skin  Non smoker  Alcohol - none  Dentist overdue - rec more frequent  due to sjogren's  Eye exam yearly - [facility] (Dr Brooke Dare)  Bowel - no constipation  Bladder - some urge incontinence symptoms    Lives in [place] now. Recently moved from [place]. Husband deceased mid 2015-12-19. No pets.  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  Diet - good water, fruits/vegetables daily, limited meat, protein drink every morning      Relevant past medical, surgical, family and social history reviewed and updated as indicated. Interim medical history since our last visit reviewed. Allergies and medications reviewed and updated. Outpatient Medications Prior to Visit  Medication Sig Dispense Refill   acetaminophen (TYLENOL) 500 MG tablet Take 1,000 mg by mouth daily.     albuterol (VENTOLIN HFA) 108 (90 Base) MCG/ACT inhaler Inhale 2 puffs into the lungs every 6 (six) hours as needed. 8 g 2   Ascorbic Acid (VITAMIN C) 100 MG tablet Take 100 mg by mouth daily.     aspirin EC 81 MG tablet Take 1 tablet (81 mg total) by mouth daily. Swallow whole. 30 tablet 12   Cholecalciferol (VITAMIN D3) 25 MCG (1000 UT) CAPS Take 2 capsules (2,000 Units total) by mouth daily.     clopidogrel (PLAVIX) 75 MG tablet TAKE 1 TABLET(75 MG) BY MOUTH EVERY EVENING 30 tablet 11   diclofenac Sodium (VOLTAREN) 1 % GEL Apply 2 g topically 3 (three) times daily. 100 g 1   Fluticasone-Umeclidin-Vilant (TRELEGY ELLIPTA) 100-62.5-25 MCG/ACT AEPB Inhale 1 puff into the lungs daily. 28 each 11   Glucagon, rDNA, (GLUCAGON EMERGENCY) 1 MG KIT INJECT into THE muscle ONCE AS NEEDED FOR emergency 1 kit 12   isosorbide mononitrate (IMDUR) 30 MG 24 hr tablet Take 0.5 tablets (15 mg total) by mouth daily. 30 tablet 6   losartan (COZAAR) 50 MG tablet Take 1 tablet (50 mg total) by mouth daily. 90 tablet 3   metoprolol succinate (TOPROL-XL) 25 MG 24 hr tablet TAKE 1  TABLET(25 MG) BY MOUTH DAILY 30 tablet 5   Multiple Vitamin (MULTIVITAMIN ADULT) TABS Take 1 tablet by mouth in the morning and at bedtime.     nitroGLYCERIN (NITROSTAT) 0.4 MG SL tablet Place 1 tablet (0.4 mg total) under the tongue every 5 (five) minutes as needed for chest pain. 25 tablet PRN   torsemide (DEMADEX) 20 MG tablet Take 2 tablets (40 mg total) by mouth daily. 60 tablet 5   insulin degludec (TRESIBA FLEXTOUCH) 100 UNIT/ML FlexTouch Pen Inject 60 Units into the skin at bedtime. (Patient taking differently: Inject 56 Units into the skin at bedtime. 56 units daily as divided doses with meals) 54 mL 1   insulin lispro (  HUMALOG) 100 UNIT/ML injection Inject 5 Units into the skin 3 (three) times daily before meals.     rosuvastatin (CRESTOR) 20 MG tablet Take 1 tablet (20 mg total) by mouth every evening. 90 tablet 4   SYNTHROID 75 MCG tablet TAKE ONE TABLET BY MOUTH Monday-Saturday BEFORE breakfast 80 tablet 4   topiramate (TOPAMAX) 50 MG tablet Take 1 tablet (50 mg total) by mouth 2 (two) times daily. (Patient taking differently: Take 50 mg by mouth as needed.) 60 tablet 12   insulin degludec (TRESIBA FLEXTOUCH) 100 UNIT/ML FlexTouch Pen Inject 30 Units into the skin 2 (two) times daily.     insulin lispro (HUMALOG) 100 UNIT/ML injection Inject 0.15 mLs (15 Units total) into the skin 3 (three) times daily before meals.     No facility-administered medications prior to visit.     Per HPI unless specifically indicated in ROS section below Review of Systems  Constitutional:  Negative for activity change, appetite change, chills, fatigue, fever and unexpected weight change.  HENT:  Negative for hearing loss.   Eyes:  Negative for visual disturbance.  Respiratory:  Positive for chest tightness, shortness of breath and wheezing. Negative for cough.   Cardiovascular:  Positive for leg swelling. Negative for chest pain and palpitations.  Gastrointestinal:  Negative for abdominal distention,  abdominal pain, blood in stool, constipation, diarrhea, nausea and vomiting.  Genitourinary:  Negative for difficulty urinating and hematuria.  Musculoskeletal:  Negative for arthralgias, myalgias and neck pain.  Skin:  Negative for rash.  Neurological:  Positive for headaches. Negative for dizziness, seizures and syncope.  Hematological:  Negative for adenopathy. Does not bruise/bleed easily.  Psychiatric/Behavioral:  Positive for dysphoric mood. The patient is not nervous/anxious.     Objective:  BP 132/72   Pulse 98   Temp 97.8 F (36.6 C) (Oral)   Ht 5' 2.75" (1.594 m)   Wt 148 lb 6 oz (67.3 kg)   SpO2 98%   BMI 26.49 kg/m   Wt Readings from Last 3 Encounters:  11/08/23 148 lb 6 oz (67.3 kg)  11/07/23 148 lb (67.1 kg)  10/09/23 148 lb 2 oz (67.2 kg)      Physical Exam Vitals and nursing note reviewed.  Constitutional:      Appearance: Normal appearance. She is not ill-appearing.  HENT:     Head: Normocephalic and atraumatic.     Right Ear: Tympanic membrane, ear canal and external ear normal. There is no impacted cerumen.     Left Ear: Tympanic membrane, ear canal and external ear normal. There is no impacted cerumen.     Mouth/Throat:     Mouth: Mucous membranes are moist.     Pharynx: Oropharynx is clear. No oropharyngeal exudate or posterior oropharyngeal erythema.  Eyes:     General:        Right eye: No discharge.        Left eye: No discharge.     Extraocular Movements: Extraocular movements intact.     Conjunctiva/sclera: Conjunctivae normal.     Pupils: Pupils are equal, round, and reactive to light.  Neck:     Thyroid: No thyroid mass or thyromegaly.     Vascular: No carotid bruit.  Cardiovascular:     Rate and Rhythm: Normal rate and regular rhythm.     Pulses: Normal pulses.     Heart sounds: Normal heart sounds. No murmur heard. Pulmonary:     Effort: Pulmonary effort is normal. No respiratory distress.  Breath sounds: Normal breath sounds. No  wheezing, rhonchi or rales.  Abdominal:     General: Bowel sounds are normal. There is no distension.     Palpations: Abdomen is soft. There is no mass.     Tenderness: There is no abdominal tenderness. There is no guarding or rebound.     Hernia: No hernia is present.  Musculoskeletal:     Cervical back: Normal range of motion and neck supple. No rigidity.     Right lower leg: No edema.     Left lower leg: No edema.  Lymphadenopathy:     Cervical: No cervical adenopathy.  Skin:    General: Skin is warm and dry.     Findings: No rash.  Neurological:     General: No focal deficit present.     Mental Status: She is alert. Mental status is at baseline.  Psychiatric:        Mood and Affect: Mood normal.        Behavior: Behavior normal.       Results for orders placed or performed in visit on 10/31/23  Basic metabolic panel   Collection Time: 10/31/23  8:15 AM  Result Value Ref Range   Sodium 136 135 - 145 mEq/L   Potassium 4.3 3.5 - 5.1 mEq/L   Chloride 97 96 - 112 mEq/L   CO2 26 19 - 32 mEq/L   Glucose, Bld 431 (H) 70 - 99 mg/dL   BUN 15 6 - 23 mg/dL   Creatinine, Ser 1.61 0.40 - 1.20 mg/dL   GFR 09.60 (L) >45.40 mL/min   Calcium 9.7 8.4 - 10.5 mg/dL  VITAMIN D 25 Hydroxy (Vit-D Deficiency, Fractures)   Collection Time: 10/31/23  8:15 AM  Result Value Ref Range   VITD 37.53 30.00 - 100.00 ng/mL  Microalbumin / creatinine urine ratio   Collection Time: 10/31/23  8:15 AM  Result Value Ref Range   Microalb, Ur 6.8 (H) 0.0 - 1.9 mg/dL   Creatinine,U 981.1 mg/dL   Microalb Creat Ratio 6.7 0.0 - 30.0 mg/g  Hemoglobin A1c   Collection Time: 10/31/23  8:15 AM  Result Value Ref Range   Hgb A1c MFr Bld 13.8 (H) 4.6 - 6.5 %   *Note: Due to a large number of results and/or encounters for the requested time period, some results have not been displayed. A complete set of results can be found in Results Review.   Lab Results  Component Value Date   TSH 1.90 10/09/2023    Lab  Results  Component Value Date   CKTOTAL 40 10/15/2021   Lab Results  Component Value Date   CHOL 217 (H) 10/09/2023   HDL 53.50 10/09/2023   LDLCALC 126 (H) 10/09/2023   LDLDIRECT 113.0 09/09/2020   TRIG 189.0 (H) 10/09/2023   CHOLHDL 4 10/09/2023    Assessment & Plan:   Problem List Items Addressed This Visit     Encounter for general adult medical examination with abnormal findings - Primary (Chronic)   Preventative protocols reviewed and updated unless pt declined. Discussed healthy diet and lifestyle.       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  She is not currently using DexCom -  awaiting delivery of this, has not started using pump.  There seems to be confusion over correct insulin administration as she is titrating basal Evaristo Bury based on glycemic control throughout the day. She does seem to follow a diabetic diet however I discussed if she is truly type 1 diabetic then the initial goal is establishing correct insulin replacement. I printed out weekly sugar logs and asked her to write down every time she checks sugars and administers insulin, and to bring me readings in 2 days to review and titrate insulin accordingly.  Last saw [facility] endo Dr Dell Ponto 05/2023, with upcoming appt 11/2023. She needs to keep this. I also asked her to contact her endocrinologist to notify of hyperglycemia.       Relevant Medications   insulin degludec (TRESIBA FLEXTOUCH) 100 UNIT/ML FlexTouch Pen   insulin lispro (HUMALOG) 100 UNIT/ML injection   rosuvastatin (CRESTOR) 20 MG tablet   Other Relevant Orders   Ambulatory referral to Neurology   Essential hypertension    Chronic, better control on higher losartan 50mg  dose however bag of medicines she brings has both 25mg  and 50mg  tablets - she will verify she's taking only total 50mg  daily.       Relevant Medications   rosuvastatin (CRESTOR) 20 MG tablet   Hypothyroidism due to Hashimoto's thyroiditis   Stable period on Synthroid 6d/wk.       Relevant Medications   SYNTHROID 75 MCG tablet   OSA (obstructive sleep apnea)   Diagnosed last year.  She states she's not currently using CPAP as she's awaiting new mask from pulm. Encouraged she call pulm to follow up on this.       Hyperlipidemia associated with type 2 diabetes mellitus (HCC)   Chronic, on rosuvastatin 20mg  daily. LDL remains uncontrolled at 126.  The ASCVD Risk score (Arnett DK, et al., 2019) failed to calculate for the following reasons:   The 2019 ASCVD risk score is only valid for ages 25 to 36       Relevant Medications   insulin degludec (TRESIBA FLEXTOUCH) 100 UNIT/ML FlexTouch Pen   insulin lispro (HUMALOG) 100 UNIT/ML injection   rosuvastatin (CRESTOR) 20 MG tablet   Vitamin D deficiency   Chronic, stable on vit d 2000 units daily       Carotid stenosis, 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   Continue VVS f/u.       Relevant Medications   rosuvastatin (CRESTOR) 20 MG tablet   Chronic right-sided headache   Continue topamax. Has seen neurology. 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      Relevant Medications   rosuvastatin (CRESTOR) 20 MG tablet   Raynaud disease   Relevant Medications    rosuvastatin (CRESTOR) 20 MG tablet   PAD (peripheral artery disease) (HCC)   Continue aspirin, statin and plavix      Relevant Medications   rosuvastatin (CRESTOR) 20 MG tablet   Sjogren's syndrome without extraglandular involvement (HCC)   Chronic venous insufficiency   Relevant Medications   rosuvastatin (CRESTOR) 20 MG tablet  Chronic dyspnea   Appreciate pulm care, treating asthma with controller Trelegy and PRN rescue inhaler  albuterol.       Osteopenia   Consider updated DEXA 5 yrs from prior.  Continue vit D 2000 units daily and regular dietary calcium intake      Chronic diastolic heart failure (HCC)   Appreciate CHF clinic care - needs to schedule f/u. # provided.       Relevant Medications   rosuvastatin (CRESTOR) 20 MG tablet   Coronary artery vasospasm (HCC)   Continue imdur 30mg  1/2 tab daily.       Relevant Medications   rosuvastatin (CRESTOR) 20 MG tablet   MCI (mild cognitive impairment) with memory loss   Acute worsening in setting of deteriorated glycemic control. Confusion over neurocognitive evaluation referral placed last visit - this was mistakenly sent to psychiatry so patient declined.  I have placed a new referral to neurology department asking specifically for neurocognitive evaluation by neuropsychology. I don't know that this is available in [place] - she is aware of long wait list and that she would have to have driver to take her to [facility] for this.       Relevant Orders   Ambulatory referral to Neurology   Asthma   Appreciate pulm care        Meds ordered this encounter  Medications   topiramate (TOPAMAX) 50 MG tablet    Sig: Take 1 tablet (50 mg total) by mouth daily.    Dispense:  90 tablet    Refill:  3   SYNTHROID 75 MCG tablet    Sig: TAKE ONE TABLET BY MOUTH Monday-Saturday BEFORE breakfast    Dispense:  80 tablet    Refill:  4   rosuvastatin (CRESTOR) 20 MG tablet    Sig: Take 1 tablet (20 mg total) by mouth  every evening.    Dispense:  90 tablet    Refill:  4    Orders Placed This Encounter  Procedures   Ambulatory referral to Neurology    Referral Priority:   Routine    Referral Type:   Consultation    Referral Reason:   Specialty Services Required    Requested Specialty:   Neurology    Number of Visits Requested:   1    Patient Instructions  Call to reschedule follow up appointment with CHF clinic in  [phone] [facility]).  I will place new referral to neuropsychologist in [place] - you will need to have someone drive with you.  Check with Dr Jayme Cloud about CPAP machine.  Your sugars are very high! Call Dr Dell Ponto to schedule diabetes follow up. Records sugars and insulin administration using blood sugar logs provided today.  Ensure you're taking a total of losartan 50mg  daily.  Drop off sugar log on Friday.  Return in 6 weeks for diabetes follow up visit.   Follow up plan: Return in about 6 weeks (around 12/20/2023) for follow up visit.  Eustaquio Boyden, MD

## 2023-11-08 NOTE — Patient Instructions (Addendum)
Call to reschedule follow up appointment with CHF clinic in [place] [phone] [facility]).  I will place new referral to neuropsychologist in [place] - you will need to have someone drive with you.  Check with Dr Jayme Cloud about CPAP machine.  Your sugars are very high! Call Dr Dell Ponto to schedule diabetes follow up. Records sugars and insulin administration using blood sugar logs provided today.  Ensure you're taking a total of losartan 50mg  daily.  Drop off sugar log on Friday.  Return in 6 weeks for diabetes follow up visit.

## 2023-11-08 NOTE — Assessment & Plan Note (Signed)
Diagnosed last year.  She states she's not currently using CPAP as she's awaiting new mask from pulm. Encouraged she call pulm to follow up on this.

## 2023-11-08 NOTE — Assessment & Plan Note (Signed)
Preventative protocols reviewed and updated unless pt declined. Discussed healthy diet and lifestyle.

## 2023-11-08 NOTE — Assessment & Plan Note (Addendum)
Chronic, on rosuvastatin 20mg  daily. LDL remains uncontrolled at 126.  The ASCVD Risk score (Arnett DK, et al., 2019) failed to calculate for the following reasons:   The 2019 ASCVD risk score is only valid for ages 52 to 40

## 2023-11-10 ENCOUNTER — Encounter (HOSPITAL_COMMUNITY): Payer: PPO

## 2023-11-10 ENCOUNTER — Ambulatory Visit: Payer: PPO | Admitting: Vascular Surgery

## 2023-11-11 ENCOUNTER — Encounter: Payer: Self-pay | Admitting: Family Medicine

## 2023-11-11 MED ORDER — ROSUVASTATIN CALCIUM 20 MG PO TABS: 20.0000 mg | ORAL_TABLET | Freq: Every evening | ORAL | 4 refills | Status: AC

## 2023-11-11 MED ORDER — LANCET DEVICE MISC: 1.0000 | Freq: Three times a day (TID) | 0 refills | Status: AC

## 2023-11-11 MED ORDER — LANCETS MISC. MISC: 1.0000 | Freq: Three times a day (TID) | 0 refills | Status: AC

## 2023-11-11 MED ORDER — BLOOD GLUCOSE TEST VI STRP: 1.0000 | ORAL_STRIP | Freq: Three times a day (TID) | 0 refills | Status: DC

## 2023-11-11 MED ORDER — SYNTHROID 75 MCG PO TABS: ORAL_TABLET | ORAL | 4 refills | Status: AC

## 2023-11-11 MED ORDER — TOPIRAMATE 50 MG PO TABS: 50.0000 mg | ORAL_TABLET | Freq: Every day | ORAL | 3 refills | Status: DC

## 2023-11-11 MED ORDER — BLOOD GLUCOSE MONITORING SUPPL DEVI: 1.0000 | Freq: Three times a day (TID) | 0 refills | Status: DC

## 2023-11-11 NOTE — Assessment & Plan Note (Signed)
Continue VVS f/u 

## 2023-11-11 NOTE — Assessment & Plan Note (Signed)
 Continue aspirin ,plavix, statin.

## 2023-11-11 NOTE — Addendum Note (Signed)
Addended by: Eustaquio Boyden on: 11/11/2023 08:51 AM   Modules accepted: Orders

## 2023-11-11 NOTE — Assessment & Plan Note (Signed)
Chronic, stable on vit d 2000 units daily

## 2023-11-11 NOTE — Assessment & Plan Note (Signed)
Appreciate pulm care, treating asthma with controller Trelegy and PRN rescue inhaler  albuterol.

## 2023-11-11 NOTE — Assessment & Plan Note (Addendum)
Discussed I remain highly concerned about her poor glycemic control with latest A1c 13.8% and hyperglycemia to 400s, and reviewed possible detrimental effects on end organs including cognition.  She is not currently using DexCom - awaiting delivery of this, has not started using pump.  There seems to be confusion over correct insulin administration as she is titrating basal Evaristo Bury based on glycemic control throughout the day. She does seem to follow a diabetic diet however I discussed if she is truly type 1 diabetic then the initial goal is establishing correct insulin replacement. I printed out weekly sugar logs and asked her to write down every time she checks sugars and administers insulin, and to bring me readings in 2 days to review and titrate insulin accordingly.  Last saw [facility] endo Dr Dell Ponto 05/2023, with upcoming appt 11/2023. She needs to keep this. I also asked her to contact her endocrinologist to notify of hyperglycemia.

## 2023-11-11 NOTE — Assessment & Plan Note (Addendum)
 Previously discussed.

## 2023-11-11 NOTE — Assessment & Plan Note (Signed)
Continue ophtho f/u (Dr Brooke Dare at [facility])

## 2023-11-11 NOTE — Assessment & Plan Note (Signed)
Appreciate pulm care.  ?

## 2023-11-11 NOTE — Assessment & Plan Note (Signed)
Continue imdur 30mg  1/2 tab daily.

## 2023-11-11 NOTE — Assessment & Plan Note (Signed)
Consider updated DEXA 5 yrs from prior.  Continue vit D 2000 units daily and regular dietary calcium intake

## 2023-11-11 NOTE — Assessment & Plan Note (Signed)
Continue topamax. Has seen neurology. 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

## 2023-11-11 NOTE — Assessment & Plan Note (Signed)
Acute worsening in setting of deteriorated glycemic control. Confusion over neurocognitive evaluation referral placed last visit - this was mistakenly sent to psychiatry so patient declined.  I have placed a new referral to neurology department asking specifically for neurocognitive evaluation by neuropsychology. I don't know that this is available in [place] - she is aware of long wait list and that she would have to have driver to take her to [facility] for this.

## 2023-11-11 NOTE — Assessment & Plan Note (Signed)
 Continue aspirin, statin and plavix

## 2023-11-11 NOTE — Assessment & Plan Note (Addendum)
Appreciate CHF clinic care - needs to schedule f/u. # provided.

## 2023-11-13 ENCOUNTER — Telehealth: Payer: Self-pay | Admitting: Family

## 2023-11-13 NOTE — Telephone Encounter (Signed)
 Pt confirmed appt for 11/14/23

## 2023-11-13 NOTE — Progress Notes (Unsigned)
Advanced Heart Failure Clinic Note    PCP: Eustaquio Boyden, MD (last seen 09/24) Primary Cardiologist: Julien Nordmann, MD (last seen 06/24) HF provider: Dorthula Nettles, MD (last seen 03/24)  Chief Complaint:   HPI:  Cassandra Benson is a 83 yo female with a PMHx significant for HTN, HLD, hypothyroidism, CAD, LADA, HA's, fibromyalgia, PAD, vasospasm, Sjogren's syndrome, and CHF.   Was in the ED 11/16/22 due to SOB due to HF exacerbation. Was in the ED 01/13/23 due to SOB/ pedal edema. Elevated D-dimer. VQ scan negative for PE. Was in the ED 03/14/23 due to chest pain that woke her from sleep associated with chest pressure and heaviness in both sides of her chest. EKG, CXR and labs were unremarkable and she was released with improvement of pain.   Echo 07/30/20: EF of 55-60% with grade II diastolic dysfunction. Echo 12/24/21: EF of 60-65% along with mild MR.  Echo 12/23/22: EF 60-65% with Grade I DD.   RHC 12/02/22: No critical coronary artery disease. The is up to ~50% stenosis at the ostium of the RCA with pressure dampening noted using 90F diagnostic catheter.  Slight improvement noted with intracoronary nitroglycerin suggesting at least some component of vasospasm.  No angiographically significant coronary artery disease noted in the left coronary artery. Normal left and right heart filling pressures. Normal Fick cardiac output/index. Small right radial artery with significant vasospasm throughout the procedure.  Recommend alternate access for future catheterizations. Recommendations: Add isosorbide mononitrate 15 mg daily and as needed sublingual nitroglycerin for antianginal therapy, including possible coronary vasospasm.  She presents today for a HF f/u visit with a chief complaint of moderate SOB with minimal exertion. Chronic in nature. Has associated intermittent sharp chest pain, fatigue, palpitations (increasing frequency), dry cough, intermittent abdominal pain and intermittent  dizziness along with this. Denies abdominal distention, pedal edema, weight gain or difficulty sleeping.   Weight is stable. Does walk 3 blocks to mailroom and 3 blocks back to home.   At last visit, diuretic was changed to torsemide 40mg  daily & she feels like she is doing better since that change.  ROS: All systems negative except as listed in HPI, PMH and Problem List.  SH:  Social History   Socioeconomic History   Marital status: Widowed    Spouse name: Not on file   Number of children: 2   Years of education: Not on file   Highest education level: 12th grade  Occupational History   Occupation: retired  Tobacco Use   Smoking status: Never   Smokeless tobacco: Never  Vaping Use   Vaping status: Never Used  Substance and Sexual Activity   Alcohol use: No   Drug use: No   Sexual activity: Not on file  Other Topics Concern   Not on file  Social History Narrative   Lives in [place], moved from [place].    Widow - husband decreased 01/2016 of metastatic colon CA   No pets.   Son Adaja Wander lives nearby. Daughter lives in [place]. Sister lives 2 blocks away.    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, [employer]   Exercise - limited   Diet - good water, fruits/vegetables daily, limited meat, protein drink every morning   Social Drivers of [employer] Resource Strain: Medium Risk (10/31/2023)   Overall Financial Resource Strain (CARDIA)    Difficulty of Paying Living Expenses: Somewhat hard  Food Insecurity: No Food Insecurity (10/31/2023)   Hunger Vital Sign    Worried About Running Out of Food in the Last Year: Never true    Ran Out of Food in the Last Year: Never true  Transportation Needs: Unknown (10/31/2023)   PRAPARE - Transportation    Lack of Transportation (Medical): No    Lack of Transportation (Non-Medical): Patient declined  Physical Activity: Insufficiently Active  (10/31/2023)   Exercise Vital Sign    Days of Exercise per Week: 3 days    Minutes of Exercise per Session: 20 min  Stress: No Stress Concern Present (10/31/2023)   [employer] of Occupational Health - Occupational Stress Questionnaire    Feeling of Stress : Only a little  Social Connections: Unknown (10/31/2023)   Social Connection and Isolation Panel [NHANES]    Frequency of Communication with Friends and Family: More than three times a week    Frequency of Social Gatherings with Friends and Family: More than three times a week    Attends Religious Services: More than 4 times per year    Active Member of [employer] or Organizations: Not on file    Attends Banker Meetings: Not on file    Marital Status: Widowed  Intimate Partner Violence: Not At Risk (11/07/2023)   Humiliation, Afraid, Rape, and Kick questionnaire    Fear of Current or Ex-Partner: No    Emotionally Abused: No    Physically Abused: No    Sexually Abused: No    FH:  Family History  Problem Relation Age of Onset   CAD Mother 72       MI, aortic valve issues   COPD Mother    Lupus Mother    Luiz Blare' disease Mother    Rheum arthritis Mother    CAD Father 36       CABG x2, aortic valve replacement   Stroke Sister    CAD Sister    Anuerysm Sister        brain   Lupus Sister    Diabetes Sister    Diabetes Sister    Breast cancer Sister    Alcohol abuse Brother    CAD Brother 61       MI   Stroke Brother    COPD Brother        agent orange   CAD Brother 60       stent   Diabetes Brother    Stroke Maternal Grandmother    Hypertension Maternal Grandmother    Gallbladder disease Maternal Grandmother    Breast cancer Maternal Aunt    Breast cancer Maternal Aunt    Depression Grandchild    Colon cancer Neg Hx    Esophageal cancer Neg Hx    Rectal cancer Neg Hx    Stomach cancer Neg Hx     Past Medical History:  Diagnosis Date   Acute diverticulitis 04/2021   [facility] ER, CT confirmed   Allergy     ANA positive    positive ANA pattern 1 speckled   Arthritis    Carotid stenosis, asymptomatic 06/19/2015   1-39% RICA 40-59% LICA rpt 1 yr (05/2015)    CHF (congestive heart failure) (HCC)    Colon polyps    COVID-19 virus infection 09/14/2021   Dermatomyositis (HCC)    Diabetes mellitus without complication (HCC)    Type 1   Diverticulosis    sigmoid on CT scan 12/2019   Family history of adverse reaction to anesthesia  brothr went into cardiac arrest from anectine   Fibromyalgia    prior PCP   GERD (gastroesophageal reflux disease)    prior PCP   Glaucoma    Narrow angle   History of blood clots    DVT, in 20s, none since   History of chicken pox    History of diverticulitis    History of pericarditis 1986   with hospitalization   History of pneumonia 2014   History of shingles    History of UTI    Hyperlipidemia    Hypertension    Hypothyroidism    Mixed connective tissue disease (HCC)    Partial small bowel obstruction (HCC) 12/2019   managed conservatively   Peptic ulcer    Pneumonia    PONV (postoperative nausea and vomiting)    Raynaud's disease without gangrene    Shoulder pain left   h/o RTC tendonitis and adhesive capsulitis   Sigmoid diverticulitis 05/26/2021   Sjogren's syndrome (HCC)    Sleep apnea    prior PCP - no CPAP for about 10 yrs   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HCC)    Vitamin D deficiency    prior PCP    Current Outpatient Medications  Medication Sig Dispense Refill   acetaminophen (TYLENOL) 500 MG tablet Take 1,000 mg by mouth daily.     albuterol (VENTOLIN HFA) 108 (90 Base) MCG/ACT inhaler Inhale 2 puffs into the lungs every 6 (six) hours as needed. 8 g 2   Ascorbic Acid (VITAMIN C) 100 MG tablet Take 100 mg by mouth daily.     aspirin EC 81 MG tablet Take 1 tablet (81 mg total) by mouth daily. Swallow whole. 30 tablet 12   Blood Glucose Monitoring Suppl DEVI 1 each by Does not apply route in the morning, at noon, and at bedtime. E13.319.  May substitute to any manufacturer covered by patient's insurance. 1 each 0   Cholecalciferol (VITAMIN D3) 25 MCG (1000 UT) CAPS Take 2 capsules (2,000 Units total) by mouth daily.     clopidogrel (PLAVIX) 75 MG tablet TAKE 1 TABLET(75 MG) BY MOUTH EVERY EVENING 30 tablet 11   diclofenac Sodium (VOLTAREN) 1 % GEL Apply 2 g topically 3 (three) times daily. 100 g 1   Fluticasone-Umeclidin-Vilant (TRELEGY ELLIPTA) 100-62.5-25 MCG/ACT AEPB Inhale 1 puff into the lungs daily. 28 each 11   Glucagon, rDNA, (GLUCAGON EMERGENCY) 1 MG KIT INJECT into THE muscle ONCE AS NEEDED FOR emergency 1 kit 12   Glucose Blood (BLOOD GLUCOSE TEST STRIPS) STRP 1 each by In Vitro route in the morning, at noon, and at bedtime. May substitute to any manufacturer covered by patient's insurance. 100 strip 0   insulin degludec (TRESIBA FLEXTOUCH) 100 UNIT/ML FlexTouch Pen Inject 30 Units into the skin 2 (two) times daily.     insulin lispro (HUMALOG) 100 UNIT/ML injection Inject 0.15 mLs (15 Units total) into the skin 3 (three) times daily before meals.     isosorbide mononitrate (IMDUR) 30 MG 24 hr tablet Take 0.5 tablets (15 mg total) by mouth daily. 30 tablet 6   Lancet Device MISC 1 each by Does not apply route in the morning, at noon, and at bedtime. May substitute to any manufacturer covered by patient's insurance. 1 each 0   Lancets Misc. MISC 1 each by Does not apply route in the morning, at noon, and at bedtime. May substitute to any manufacturer covered by patient's insurance. 100 each 0   losartan (COZAAR) 50 MG tablet Take 1  tablet (50 mg total) by mouth daily. 90 tablet 3   metoprolol succinate (TOPROL-XL) 25 MG 24 hr tablet TAKE 1 TABLET(25 MG) BY MOUTH DAILY 30 tablet 5   Multiple Vitamin (MULTIVITAMIN ADULT) TABS Take 1 tablet by mouth in the morning and at bedtime.     nitroGLYCERIN (NITROSTAT) 0.4 MG SL tablet Place 1 tablet (0.4 mg total) under the tongue every 5 (five) minutes as needed for chest pain. 25  tablet PRN   rosuvastatin (CRESTOR) 20 MG tablet Take 1 tablet (20 mg total) by mouth every evening. 90 tablet 4   SYNTHROID 75 MCG tablet TAKE ONE TABLET BY MOUTH Monday-Saturday BEFORE breakfast 80 tablet 4   topiramate (TOPAMAX) 50 MG tablet Take 1 tablet (50 mg total) by mouth daily. 90 tablet 3   torsemide (DEMADEX) 20 MG tablet Take 2 tablets (40 mg total) by mouth daily. 60 tablet 5   No current facility-administered medications for this visit.   There were no vitals filed for this visit.  Wt Readings from Last 3 Encounters:  11/08/23 148 lb 6 oz (67.3 kg)  11/07/23 148 lb (67.1 kg)  10/09/23 148 lb 2 oz (67.2 kg)   Lab Results  Component Value Date   CREATININE 0.95 10/31/2023   CREATININE 0.96 10/09/2023   CREATININE 0.92 07/20/2023    PHYSICAL EXAM:  General:  Well appearing. No resp difficulty HEENT: normal Neck: supple. JVP flat. No lymphadenopathy or thryomegaly appreciated. Cor: PMI normal. Regular rate & rhythm. No rubs, gallops or murmurs. Lungs: clear Abdomen: soft, nontender, nondistended. No hepatosplenomegaly. No bruits or masses. Extremities: no cyanosis, clubbing, rash, trace pitting edema bilateral lower legs Neuro: alert & oriented x3, cranial nerves grossly intact. Moves all 4 extremities w/o difficulty. Affect pleasant.   ECG: 03/23/23 showed NSR with HR 78   ASSESSMENT & PLAN:  NICM with preserved EF- - likely d/t HTN as cath showed no critical CAD - NYHA III - euvolemic - weighing daily; reminded to call for an overnight weight gain of >2 pounds or a weekly weight gain of >5 pounds - weight down 1 pound from last visit here 2 months ago - Echo 07/30/20: EF of 55-60% with grade II diastolic dysfunction.  - Echo 4/78/29: EF of 60-65% along with mild MR.  - Echo 12/23/22: EF 60-65% with Grade I DD.  - RHC 12/02/22: No critical coronary artery disease.  The is up to ~50% stenosis at the ostium of the RCA with pressure dampening noted using 38F  diagnostic catheter.  Slight improvement noted with intracoronary nitroglycerin suggesting at least some component of vasospasm.  No angiographically significant coronary artery disease noted in the left coronary artery. Normal left and right heart filling pressures. Normal Fick cardiac output/index. Small right radial artery with significant vasospasm throughout the procedure.  Recommend alternate access for future catheterizations. - adhering to low sodium diet and fluid restriction - continue losartan 25mg  daily - continue metoprolol succinate 25mg  daily PRN - continue torsemide 40mg  daily - valsartan caused hacking cough - can not add SGLT2 due to LADA - saw HF provider Gasper Lloyd) 03/24 - saw cardiology Mariah Milling) 06/24 - BNP 01/13/23 was 74.4 - PharmD reconciled meds with patient  2. HTN - BP 126/64 - saw PCP Sharen Hones) 09/24 - BMP 03/14/23 reviewed: sodium 137, potassium 3.9, creatinine 0.83 & GFR >60   3. LADA - saw endocrinology Dell Ponto) 09/24 - A1c 06/12/23 was 9.7%  4: Mild obstructive sleep apnea - saw pulmonology Jayme Cloud) 09/24 - home  sleep study done 09/22/22 which showed mild obstructive sleep apnea with AHI of 8.5 and oxygen saturations as low as 82% nocturnally. Patient declined CPAP/oxygen.  - PFT's 04/22/22  Return in 3 months, sooner if needed.      Delma Freeze, FNP 11/13/23

## 2023-11-14 ENCOUNTER — Encounter: Payer: Self-pay | Admitting: Family

## 2023-11-14 ENCOUNTER — Encounter: Payer: Self-pay | Admitting: Family Medicine

## 2023-11-14 ENCOUNTER — Ambulatory Visit: Payer: PPO | Attending: Family | Admitting: Family

## 2023-11-14 VITALS — BP 123/65 | HR 68 | Wt 149.0 lb

## 2023-11-14 DIAGNOSIS — I739 Peripheral vascular disease, unspecified: Secondary | ICD-10-CM | POA: Insufficient documentation

## 2023-11-14 DIAGNOSIS — M3500 Sicca syndrome, unspecified: Secondary | ICD-10-CM | POA: Diagnosis not present

## 2023-11-14 DIAGNOSIS — I5032 Chronic diastolic (congestive) heart failure: Secondary | ICD-10-CM | POA: Diagnosis not present

## 2023-11-14 DIAGNOSIS — E785 Hyperlipidemia, unspecified: Secondary | ICD-10-CM | POA: Diagnosis not present

## 2023-11-14 DIAGNOSIS — E782 Mixed hyperlipidemia: Secondary | ICD-10-CM | POA: Diagnosis not present

## 2023-11-14 DIAGNOSIS — E039 Hypothyroidism, unspecified: Secondary | ICD-10-CM | POA: Insufficient documentation

## 2023-11-14 DIAGNOSIS — R519 Headache, unspecified: Secondary | ICD-10-CM | POA: Diagnosis not present

## 2023-11-14 DIAGNOSIS — Z79899 Other long term (current) drug therapy: Secondary | ICD-10-CM | POA: Insufficient documentation

## 2023-11-14 DIAGNOSIS — J45909 Unspecified asthma, uncomplicated: Secondary | ICD-10-CM | POA: Diagnosis not present

## 2023-11-14 DIAGNOSIS — E139 Other specified diabetes mellitus without complications: Secondary | ICD-10-CM | POA: Diagnosis not present

## 2023-11-14 DIAGNOSIS — I110 Hypertensive heart disease with heart failure: Secondary | ICD-10-CM | POA: Diagnosis not present

## 2023-11-14 DIAGNOSIS — I10 Essential (primary) hypertension: Secondary | ICD-10-CM

## 2023-11-14 DIAGNOSIS — G4733 Obstructive sleep apnea (adult) (pediatric): Secondary | ICD-10-CM | POA: Diagnosis not present

## 2023-11-14 DIAGNOSIS — I2510 Atherosclerotic heart disease of native coronary artery without angina pectoris: Secondary | ICD-10-CM | POA: Diagnosis not present

## 2023-11-14 DIAGNOSIS — M797 Fibromyalgia: Secondary | ICD-10-CM | POA: Insufficient documentation

## 2023-11-14 DIAGNOSIS — I428 Other cardiomyopathies: Secondary | ICD-10-CM | POA: Insufficient documentation

## 2023-11-14 NOTE — Patient Instructions (Addendum)
  It was good to see you today, stay warm!   Start wearing your compression socks daily with removal at bedtime.

## 2023-11-14 NOTE — Progress Notes (Signed)
ReDS Vest / Clip - 11/14/23 1000       ReDS Vest / Clip   Station Marker A    Ruler Value 28    ReDS Value Range Low volume    ReDS Actual Value 29            Roxy Horseman, RN, BSN [facility] Heart Failure Navigator Secure Chat Only

## 2023-11-30 ENCOUNTER — Emergency Department

## 2023-11-30 ENCOUNTER — Inpatient Hospital Stay

## 2023-11-30 ENCOUNTER — Inpatient Hospital Stay
Admission: EM | Admit: 2023-11-30 | Discharge: 2023-12-01 | DRG: 312 | Disposition: A | Discharge status: Home / Self Care | Attending: Internal Medicine | Admitting: Internal Medicine

## 2023-11-30 ENCOUNTER — Ambulatory Visit: Payer: Self-pay | Admitting: Family Medicine

## 2023-11-30 ENCOUNTER — Other Ambulatory Visit: Payer: Self-pay

## 2023-11-30 DIAGNOSIS — G4700 Insomnia, unspecified: Secondary | ICD-10-CM | POA: Diagnosis not present

## 2023-11-30 DIAGNOSIS — I110 Hypertensive heart disease with heart failure: Secondary | ICD-10-CM | POA: Diagnosis not present

## 2023-11-30 DIAGNOSIS — I739 Peripheral vascular disease, unspecified: Secondary | ICD-10-CM | POA: Diagnosis present

## 2023-11-30 DIAGNOSIS — Z7982 Long term (current) use of aspirin: Secondary | ICD-10-CM | POA: Diagnosis not present

## 2023-11-30 DIAGNOSIS — Z8616 Personal history of COVID-19: Secondary | ICD-10-CM | POA: Diagnosis not present

## 2023-11-30 DIAGNOSIS — E063 Autoimmune thyroiditis: Secondary | ICD-10-CM | POA: Diagnosis present

## 2023-11-30 DIAGNOSIS — G4733 Obstructive sleep apnea (adult) (pediatric): Secondary | ICD-10-CM | POA: Diagnosis not present

## 2023-11-30 DIAGNOSIS — Z833 Family history of diabetes mellitus: Secondary | ICD-10-CM | POA: Diagnosis not present

## 2023-11-30 DIAGNOSIS — Z8249 Family history of ischemic heart disease and other diseases of the circulatory system: Secondary | ICD-10-CM

## 2023-11-30 DIAGNOSIS — R000 Tachycardia, unspecified: Secondary | ICD-10-CM | POA: Diagnosis not present

## 2023-11-30 DIAGNOSIS — Z79890 Hormone replacement therapy (postmenopausal): Secondary | ICD-10-CM | POA: Diagnosis not present

## 2023-11-30 DIAGNOSIS — M797 Fibromyalgia: Secondary | ICD-10-CM | POA: Diagnosis present

## 2023-11-30 DIAGNOSIS — R2681 Unsteadiness on feet: Secondary | ICD-10-CM | POA: Diagnosis present

## 2023-11-30 DIAGNOSIS — E1169 Type 2 diabetes mellitus with other specified complication: Secondary | ICD-10-CM | POA: Diagnosis present

## 2023-11-30 DIAGNOSIS — Z90710 Acquired absence of both cervix and uterus: Secondary | ICD-10-CM

## 2023-11-30 DIAGNOSIS — M3500 Sicca syndrome, unspecified: Secondary | ICD-10-CM | POA: Diagnosis not present

## 2023-11-30 DIAGNOSIS — Z8619 Personal history of other infectious and parasitic diseases: Secondary | ICD-10-CM

## 2023-11-30 DIAGNOSIS — Z794 Long term (current) use of insulin: Secondary | ICD-10-CM

## 2023-11-30 DIAGNOSIS — Z79899 Other long term (current) drug therapy: Secondary | ICD-10-CM | POA: Diagnosis not present

## 2023-11-30 DIAGNOSIS — Z96641 Presence of right artificial hip joint: Secondary | ICD-10-CM | POA: Diagnosis present

## 2023-11-30 DIAGNOSIS — R29818 Other symptoms and signs involving the nervous system: Secondary | ICD-10-CM | POA: Diagnosis not present

## 2023-11-30 DIAGNOSIS — E785 Hyperlipidemia, unspecified: Secondary | ICD-10-CM | POA: Diagnosis not present

## 2023-11-30 DIAGNOSIS — Z825 Family history of asthma and other chronic lower respiratory diseases: Secondary | ICD-10-CM

## 2023-11-30 DIAGNOSIS — Z7902 Long term (current) use of antithrombotics/antiplatelets: Secondary | ICD-10-CM

## 2023-11-30 DIAGNOSIS — Z882 Allergy status to sulfonamides status: Secondary | ICD-10-CM

## 2023-11-30 DIAGNOSIS — I10 Essential (primary) hypertension: Secondary | ICD-10-CM | POA: Diagnosis present

## 2023-11-30 DIAGNOSIS — Z8711 Personal history of peptic ulcer disease: Secondary | ICD-10-CM

## 2023-11-30 DIAGNOSIS — Z823 Family history of stroke: Secondary | ICD-10-CM

## 2023-11-30 DIAGNOSIS — Z883 Allergy status to other anti-infective agents status: Secondary | ICD-10-CM

## 2023-11-30 DIAGNOSIS — Z811 Family history of alcohol abuse and dependence: Secondary | ICD-10-CM

## 2023-11-30 DIAGNOSIS — Z8269 Family history of other diseases of the musculoskeletal system and connective tissue: Secondary | ICD-10-CM

## 2023-11-30 DIAGNOSIS — R55 Syncope and collapse: Principal | ICD-10-CM

## 2023-11-30 DIAGNOSIS — Z888 Allergy status to other drugs, medicaments and biological substances status: Secondary | ICD-10-CM

## 2023-11-30 DIAGNOSIS — I509 Heart failure, unspecified: Secondary | ICD-10-CM | POA: Diagnosis present

## 2023-11-30 DIAGNOSIS — Z91041 Radiographic dye allergy status: Secondary | ICD-10-CM

## 2023-11-30 DIAGNOSIS — I951 Orthostatic hypotension: Secondary | ICD-10-CM | POA: Diagnosis not present

## 2023-11-30 DIAGNOSIS — Z803 Family history of malignant neoplasm of breast: Secondary | ICD-10-CM

## 2023-11-30 DIAGNOSIS — Z881 Allergy status to other antibiotic agents status: Secondary | ICD-10-CM

## 2023-11-30 DIAGNOSIS — Z885 Allergy status to narcotic agent status: Secondary | ICD-10-CM

## 2023-11-30 DIAGNOSIS — R531 Weakness: Secondary | ICD-10-CM | POA: Diagnosis not present

## 2023-11-30 DIAGNOSIS — I6521 Occlusion and stenosis of right carotid artery: Secondary | ICD-10-CM | POA: Diagnosis not present

## 2023-11-30 DIAGNOSIS — K219 Gastro-esophageal reflux disease without esophagitis: Secondary | ICD-10-CM | POA: Diagnosis present

## 2023-11-30 DIAGNOSIS — Z86718 Personal history of other venous thrombosis and embolism: Secondary | ICD-10-CM | POA: Diagnosis not present

## 2023-11-30 DIAGNOSIS — Z818 Family history of other mental and behavioral disorders: Secondary | ICD-10-CM

## 2023-11-30 DIAGNOSIS — G459 Transient cerebral ischemic attack, unspecified: Secondary | ICD-10-CM | POA: Diagnosis present

## 2023-11-30 DIAGNOSIS — R0602 Shortness of breath: Secondary | ICD-10-CM | POA: Diagnosis not present

## 2023-11-30 DIAGNOSIS — Z8261 Family history of arthritis: Secondary | ICD-10-CM | POA: Diagnosis not present

## 2023-11-30 DIAGNOSIS — Z7951 Long term (current) use of inhaled steroids: Secondary | ICD-10-CM

## 2023-11-30 DIAGNOSIS — Z880 Allergy status to penicillin: Secondary | ICD-10-CM

## 2023-11-30 DIAGNOSIS — I6529 Occlusion and stenosis of unspecified carotid artery: Secondary | ICD-10-CM | POA: Diagnosis not present

## 2023-11-30 LAB — BASIC METABOLIC PANEL
Anion gap: 13 (ref 5–15)
BUN: 17 mg/dL (ref 8–23)
CO2: 26 mmol/L (ref 22–32)
Calcium: 10.5 mg/dL — ABNORMAL HIGH (ref 8.9–10.3)
Chloride: 103 mmol/L (ref 98–111)
Creatinine, Ser: 0.85 mg/dL (ref 0.44–1.00)
GFR, Estimated: >60 mL/min (ref >60)
Glucose, Bld: 85 mg/dL (ref 70–99)
Potassium: 3.4 mmol/L — ABNORMAL LOW (ref 3.5–5.1)
Sodium: 142 mmol/L (ref 135–145)

## 2023-11-30 LAB — CBC
HCT: 46.4 % — ABNORMAL HIGH (ref 36.0–46.0)
Hemoglobin: 15.2 g/dL — ABNORMAL HIGH (ref 12.0–15.0)
MCH: 27.4 pg (ref 26.0–34.0)
MCHC: 32.8 g/dL (ref 30.0–36.0)
MCV: 83.8 fL (ref 80.0–100.0)
Platelets: 239 10*3/uL (ref 150–400)
RBC: 5.54 MIL/uL — ABNORMAL HIGH (ref 3.87–5.11)
RDW: 13.9 % (ref 11.5–15.5)
WBC: 11.8 10*3/uL — ABNORMAL HIGH (ref 4.0–10.5)
nRBC: 0 % (ref 0.0–0.2)

## 2023-11-30 LAB — CBG MONITORING, ED
Glucose-Capillary: 75 mg/dL (ref 70–99)
Glucose-Capillary: 69 mg/dL — ABNORMAL LOW (ref 70–99)
Glucose-Capillary: 301 mg/dL — ABNORMAL HIGH (ref 70–99)

## 2023-11-30 LAB — TROPONIN I (HIGH SENSITIVITY)
Troponin I (High Sensitivity): 5 ng/L (ref <18)
Troponin I (High Sensitivity): 4 ng/L (ref <18)

## 2023-11-30 LAB — D-DIMER, QUANTITATIVE: D-Dimer, Quant: 0.93 ug{FEU}/mL — ABNORMAL HIGH (ref 0.00–0.50)

## 2023-11-30 MED ORDER — SODIUM CHLORIDE 0.9% FLUSH
3.0000 mL | Freq: Two times a day (BID) | INTRAVENOUS | Status: DC
  Administered 2023-11-30 – 2023-12-01 (×2): 3 mL via INTRAVENOUS

## 2023-11-30 MED ORDER — VITAMIN C 500 MG PO TABS
250.0000 mg | ORAL_TABLET | Freq: Every day | ORAL | Status: DC
  Administered 2023-12-01: 250 mg via ORAL
  Filled 2023-11-30: qty 1

## 2023-11-30 MED ORDER — ROSUVASTATIN CALCIUM 20 MG PO TABS: 20.0000 mg | ORAL_TABLET | Freq: Every evening | ORAL | Status: DC

## 2023-11-30 MED ORDER — INSULIN ASPART 100 UNIT/ML IJ SOLN
0.0000 [IU] | Freq: Every day | INTRAMUSCULAR | Status: DC
  Administered 2023-11-30: 3 [IU] via SUBCUTANEOUS
  Filled 2023-11-30: qty 1

## 2023-11-30 MED ORDER — ONDANSETRON HCL 4 MG PO TABS
4.0000 mg | ORAL_TABLET | Freq: Four times a day (QID) | ORAL | Status: DC | PRN
Start: 2023-11-30 — End: 2023-12-01

## 2023-11-30 MED ORDER — NITROGLYCERIN 0.4 MG SL SUBL: 0.4000 mg | SUBLINGUAL_TABLET | SUBLINGUAL | Status: DC | PRN

## 2023-11-30 MED ORDER — INSULIN GLARGINE-YFGN 100 UNIT/ML ~~LOC~~ SOLN
48.0000 [IU] | Freq: Every day | SUBCUTANEOUS | Status: DC
  Filled 2023-11-30: qty 0.48

## 2023-11-30 MED ORDER — HEPARIN SODIUM (PORCINE) 5000 UNIT/ML IJ SOLN
5000.0000 [IU] | Freq: Three times a day (TID) | INTRAMUSCULAR | Status: DC
  Administered 2023-11-30 – 2023-12-01 (×3): 5000 [IU] via SUBCUTANEOUS
  Filled 2023-11-30 (×3): qty 1

## 2023-11-30 MED ORDER — HYDRALAZINE HCL 20 MG/ML IJ SOLN: 5.0000 mg | Freq: Four times a day (QID) | INTRAMUSCULAR | Status: DC | PRN

## 2023-11-30 MED ORDER — ASPIRIN 81 MG PO TBEC
81.0000 mg | DELAYED_RELEASE_TABLET | Freq: Every day | ORAL | Status: DC
  Administered 2023-12-01: 81 mg via ORAL
  Filled 2023-11-30: qty 1

## 2023-11-30 MED ORDER — INSULIN ASPART 100 UNIT/ML IJ SOLN
0.0000 [IU] | Freq: Three times a day (TID) | INTRAMUSCULAR | Status: DC
  Administered 2023-12-01: 8 [IU] via SUBCUTANEOUS
  Filled 2023-11-30: qty 1

## 2023-11-30 MED ORDER — SENNOSIDES-DOCUSATE SODIUM 8.6-50 MG PO TABS: 1.0000 | ORAL_TABLET | Freq: Every evening | ORAL | Status: DC | PRN

## 2023-11-30 MED ORDER — ACETAMINOPHEN 325 MG PO TABS
650.0000 mg | ORAL_TABLET | Freq: Four times a day (QID) | ORAL | Status: DC | PRN
Start: 2023-11-30 — End: 2023-12-01
  Administered 2023-12-01: 650 mg via ORAL
  Filled 2023-11-30: qty 2

## 2023-11-30 MED ORDER — LEVOTHYROXINE SODIUM 50 MCG PO TABS
75.0000 ug | ORAL_TABLET | ORAL | Status: DC
  Administered 2023-12-01: 75 ug via ORAL
  Filled 2023-11-30: qty 1

## 2023-11-30 MED ORDER — ACETAMINOPHEN 650 MG RE SUPP: 650.0000 mg | Freq: Four times a day (QID) | RECTAL | Status: DC | PRN

## 2023-11-30 MED ORDER — CLOPIDOGREL BISULFATE 75 MG PO TABS
75.0000 mg | ORAL_TABLET | Freq: Every evening | ORAL | Status: DC
  Administered 2023-11-30: 75 mg via ORAL
  Filled 2023-11-30: qty 1

## 2023-11-30 MED ORDER — LOSARTAN POTASSIUM 50 MG PO TABS
50.0000 mg | ORAL_TABLET | Freq: Every day | ORAL | Status: DC
  Administered 2023-12-01: 50 mg via ORAL
  Filled 2023-11-30: qty 1

## 2023-11-30 MED ORDER — ONDANSETRON HCL 4 MG/2ML IJ SOLN: 4.0000 mg | Freq: Four times a day (QID) | INTRAMUSCULAR | Status: DC | PRN

## 2023-11-30 NOTE — Hospital Course (Addendum)
 [name] is 69 female with history of IDDM2, hypertension, hyperlipidemia, who presents emergency department for chief concerns of syncope.  Vitals in the ED showed temperature of 98.3, respiration 19, heart rate 101, blood pressure 145/63, SpO2 98 percent on room air.    Serum sodium is 142, potassium 3.4, chloride 103, bicarb 26, BUN of 17, serum creatinine 0.85, EGFR greater than 60, nonfasting blood glucose 85, WBC 11.8, hemoglobin 15.2, platelets of 239.  D-dimer is 0.93.  CT head and cervical spine wo contrast: Was read as no acute intracranial abnormality.  No acute fracture or traumatic malalignment in the cervical spine.  Bilateral ultrasound of the lower extremity: Was read as no evidence of DVT.  Chest x-ray 2 view: No acute cardiopulmonary process.  ED treatment: None

## 2023-11-30 NOTE — Assessment & Plan Note (Signed)
 Home levothyroxine 75 mcg per home dosing instruction resumed on admission

## 2023-11-30 NOTE — Telephone Encounter (Signed)
 Chief Complaint: blacking out Symptoms: fainting Frequency: yesterday Pertinent Negatives: Patient denies no chest pain Disposition: [x] ED /[] Urgent Care (no appt availability in office) / [] Appointment(In office/virtual)/ []  [facility]/ [] Home Care/ [] Refused Recommended Disposition /[] [facility]/ []  Follow-up with PCP Additional Notes: patient states that yesterday evening she has a spell where she blacked out and fainted it also happened last Sunday. States she was going to go to the ER but did not feel safe driving herself and she is home alone. States she gets dizzy and lightheaded easily. Pt states she has a hx of CHF and she was SOB while walking.    Copied from CRM [id number]. Topic: Clinical - Red Word Triage >> Nov 30, 2023 12:08 PM [name] wrote: Red Word that prompted transfer to Nurse Triage: pt called stating she is having Black outs/ fainting spells. Reason for Disposition  Sounds like a life-threatening emergency to the triager  Answer Assessment - Initial Assessment Questions 1. ONSET: "How long were you unconscious?" (minutes) "When did it happen?"     unknown 2. CONTENT: "What happened during period of unconsciousness?" (e.g., seizure activity)      Was standing up from sitting and got dizzy and blacked out 3. MENTAL STATUS: "Alert and oriented now?" (oriented x 3 = name, month, location)      Alert and oriented 4. TRIGGER: "What do you think caused the fainting?" "What were you doing just before you fainted?"  (e.g., exercise, sudden standing up, prolonged standing)     Sitting to standing position, and just standing 5. RECURRENT SYMPTOM: "Have you ever passed out before?" If Yes, ask: "When was the last time?" and "What happened that time?"      Last Sunday  6. INJURY: "Did you sustain any injury during the fall?"      Hurt tail bone 7. CARDIAC SYMPTOMS: "Have you had any of the following symptoms: chest pain, difficulty breathing,  palpitations?"     Chf, difficulty breathing with movement 8. NEUROLOGIC SYMPTOMS: "Have you had any of the following symptoms: headache, numbness, vertigo, weakness?"     Off balance,  10. OTHER SYMPTOMS: "Do you have any other symptoms?"       Shortness of breath  Protocols used: Fainting-A-AH

## 2023-11-30 NOTE — ED Provider Notes (Signed)
 [facility] Provider Note    Event Date/Time   First MD Initiated Contact with Patient 11/30/23 1502     (approximate)   History   Near Syncope   HPI  Cassandra Benson is a 83 y.o. female reports yesterday she had episode where she felt like she almost blacked out.  Fell to her side lying on the couch.  Today she had a similar episode.  Both episodes occurring while walking.  No chest pain.  Very remote history of a previous DVT "years ago" in [place].     Patient has a history of previous carotid stenosis small bowel obstruction diverticulitis congestive heart failure diabetes  Reviewed phone call note to primary care as well patient reported having a syncopal episode yesterday as well in addition to near syncope today.  She does have a complicated medical history including history of previous documented carotid stenosis and congestive heart failure  Physical Exam   Triage Vital Signs: ED Triage Vitals  Encounter Vitals Group     BP 11/30/23 1352 (!) 145/63     Systolic BP Percentile --      Diastolic BP Percentile --      Pulse Rate 11/30/23 1352 (!) 101     Resp 11/30/23 1352 19     Temp 11/30/23 1352 98.3 F (36.8 C)     Temp Source 11/30/23 1352 Oral     SpO2 11/30/23 1352 98 %     Weight 11/30/23 1358 149 lb (67.6 kg)     Height --      Head Circumference --      Peak Flow --      Pain Score 11/30/23 1357 9     Pain Loc --      Pain Education --      Exclude from Growth Chart --     Most recent vital signs: Vitals:   11/30/23 1352  BP: (!) 145/63  Pulse: (!) 101  Resp: 19  Temp: 98.3 F (36.8 C)  SpO2: 98%     General: Awake, no distress.  CV:  Good peripheral perfusion.  Normal tones and rate.  No murmur.  Lung sounds clear bilaterally Resp:  Normal effort.  Abd:  No distention.  Soft nontender nondistended Other:  No carotid bruits.  No weakness.  Moves all extremities well.  Facial expressions equal.   Extraocular movements normal.  Speech clear very well oriented.   ED Results / Procedures / Treatments   Labs (all labs ordered are listed, but only abnormal results are displayed) Labs Reviewed  BASIC METABOLIC PANEL - Abnormal; Notable for the following components:      Result Value   Potassium 3.4 (*)    Calcium 10.5 (*)    All other components within normal limits  CBC - Abnormal; Notable for the following components:   WBC 11.8 (*)    RBC 5.54 (*)    Hemoglobin 15.2 (*)    HCT 46.4 (*)    All other components within normal limits  D-DIMER, QUANTITATIVE - Abnormal; Notable for the following components:   D-Dimer, Quant 0.93 (*)    All other components within normal limits  CBG MONITORING, ED  TROPONIN I (HIGH SENSITIVITY)  TROPONIN I (HIGH SENSITIVITY)   Labs interpreted as very mild leukocytosis, nonspecific.  Patient is not currently febrile.  Hemoglobin slightly elevated.  Platelet count appropriate.  Labs unremarkable with exception to very borderline elevated calcium which I do not think at  this point clearly clinically noted (ionized)  EKG  Interpreted by me at 1400 heart rate 90 QRS 80 QTc 450 Slight artifact.  Normal sinus rhythm no evidence of acute ischemia.  No dysrhythmia.   RADIOLOGY  CT imaging of the head interpreted by me as grossly negative for acute intracranial hemorrhage or large mass effect  No results found.     PROCEDURES:  Critical Care performed: No  Procedures    IMPRESSION / MDM / ASSESSMENT AND PLAN / ED COURSE  I reviewed the triage vital signs and the nursing notes.                              Differential diagnosis includes, but is not limited to, near syncope or syncope multiple over the last couple of days.  Cause is not obvious at this time but given her history she is considered a high risk syncope.  Will send D-dimer as she does report a remote history of DVT at this point I do not have any other clear explanation for  cause.  Vital signs reassuring with exception to when she arrived just a slight tachycardia.  She reports that this occurs primarily with exertion which is a concerning symptom but at this time there is no noted murmur or obvious,   Patient's presentation is most consistent with acute complicated illness / injury requiring diagnostic workup.    The patient is on the cardiac monitor to evaluate for evidence of arrhythmia and/or significant heart rate changes.  ----------------------------------------- 4:29 PM on 11/30/2023 ----------------------------------------- Patient agreeable with admission for further workup.  High risk syncope.  Discussed admission with Dr. Sedalia Muta who has accepted.  Pending studies will be followed by the hospitalist and we discussed them, including pending D-dimer as well as lower extremity ultrasound      FINAL CLINICAL IMPRESSION(S) / ED DIAGNOSES   Final diagnoses:  Near syncope     Rx / DC Orders   ED Discharge Orders     None        Note:  This document was prepared using Dragon voice recognition software and may include unintentional dictation errors.   Sharyn Creamer, MD 12/01/23 [phone]

## 2023-11-30 NOTE — ED Triage Notes (Signed)
 Pt BIB EMS with c/o near syncope that has happen 2-3 over the past 2 weeks. Per pt, her vision goes blacks and falls to whatever is near her. Pt denies hitting her head. Pt does take plavix. Pt does endorses right arm weakness that started about a month ago. Pt a&ox4.

## 2023-11-30 NOTE — ED Notes (Signed)
Pt assisted to bedside commode at this time

## 2023-11-30 NOTE — Telephone Encounter (Signed)
 Will await ER evaluation thank you.

## 2023-11-30 NOTE — Assessment & Plan Note (Addendum)
 Fall precaution Etiology workup in progress, differentials include carotid stenosis, TIA, stroke Holding home metoprolol and Imdur on admission Complete echo, MRI of the brain with and without contrast and bilateral ultrasound of the carotids have been ordered on admission Admit to telemetry cardiac, inpatient

## 2023-11-30 NOTE — Assessment & Plan Note (Signed)
 Home losartan 50 mg daily resumed on admission Imdur and metoprolol were not resumed on admission due to blackout episodes and pending workup AM team to resume when the benefits outweigh the risk Hydralazine 5 mg IV every 6 hours as needed for SBP greater 170, 5 days ordered

## 2023-11-30 NOTE — Assessment & Plan Note (Signed)
 Home rosuvastatin 20 mg every evening resumed

## 2023-11-30 NOTE — Telephone Encounter (Signed)
 Reviewed patient chart she is in ED now

## 2023-11-30 NOTE — H&P (Signed)
 History and Physical   Cassandra Benson ZOX:[id number] DOB: 10/07/1940 DOA: 11/30/2023  PCP: Eustaquio Boyden, MD  Patient coming from: Home  I have personally briefly reviewed patient's old medical records in [facility] EMR.  Chief Concern: Near syncope  HPI: Cassandra Benson is 59 female with history of IDDM2, hypertension, hyperlipidemia, who presents emergency department for chief concerns of syncope.  Vitals in the ED showed temperature of 98.3, respiration 19, heart rate 101, blood pressure 145/63, SpO2 98 percent on room air.    Serum sodium is 142, potassium 3.4, chloride 103, bicarb 26, BUN of 17, serum creatinine 0.85, EGFR greater than 60, nonfasting blood glucose 85, WBC 11.8, hemoglobin 15.2, platelets of 239.  D-dimer is 0.93.  CT head and cervical spine wo contrast: Was read as no acute intracranial abnormality.  No acute fracture or traumatic malalignment in the cervical spine.  Bilateral ultrasound of the lower extremity: Was read as no evidence of DVT.  Chest x-ray 2 view: No acute cardiopulmonary process.  ED treatment: None ----------------------------------- At bedside, patient is able to tell me her first and last name, age, location, current calendar year.  She reports that over the last 1 to 2 weeks, she has been having increasing episodes of blacking out.  She does not lose consciousness during these episodes however during these episodes , she feels like she loses control of her function and does not know what she is doing.  She denies seizure or jerking movements.  She reports these episodes are lasting about 45 min - 1 hour.  She reports that she had 1 episode 1 time last year however it did not last nearly as long and it only happened 1 time.  Over the last week, she has had several episodes which was very alarming for her.  She denies double vision.  She denies fever, nausea, vomiting, chills, chest pain, shortness of breath, dysuria,  hematuria, diarrhea, difficulty urinating, blood in her stool.  She denies swelling of her lower extremities.  She denies known trauma to her person.  Social history: She lives at home on her own.  She denies tobacco, EtOH, recreational drug use.  ROS: Constitutional: no weight change, no fever ENT/Mouth: no sore throat, no rhinorrhea Eyes: no eye pain, no vision changes Cardiovascular: no chest pain, no dyspnea,  no edema, no palpitations Respiratory: no cough, no sputum, no wheezing Gastrointestinal: no nausea, no vomiting, no diarrhea, no constipation Genitourinary: no urinary incontinence, no dysuria, no hematuria Musculoskeletal: no arthralgias, no myalgias Skin: no skin lesions, no pruritus, Neuro: + weakness, no loss of consciousness, + near syncope, + black out episodes Psych: no anxiety, no depression, + decrease appetite Heme/Lymph: no bruising, no bleeding  ED Course: Discussed with EDP, patient requiring hospitalization for chief concerns of syncope and near syncope episodes.  Assessment/Plan  Principal Problem:   Syncope Active Problems:   Essential hypertension   Hypothyroidism due to Hashimoto's thyroiditis   OSA (obstructive sleep apnea)   Hyperlipidemia associated with type 2 diabetes mellitus (HCC)   TIA (transient ischemic attack)   PAD (peripheral artery disease) (HCC)   Sjogren's syndrome without extraglandular involvement (HCC)   Unsteadiness   Insomnia   Assessment and Plan:  * Syncope Fall precaution Etiology workup in progress, differentials include carotid stenosis, TIA, stroke Holding home metoprolol and Imdur on admission Complete echo, MRI of the brain with and without contrast and bilateral ultrasound of the carotids have been ordered on admission Admit to telemetry cardiac,  inpatient  PAD (peripheral artery disease) (HCC) Aspirin 81 mg daily, Plavix 75 mg every evening were resumed  Hyperlipidemia associated with type 2 diabetes mellitus  (HCC) Home rosuvastatin 20 mg every evening resumed  Hypothyroidism due to Hashimoto's thyroiditis Home levothyroxine 75 mcg per home dosing instruction resumed on admission  Essential hypertension Home losartan 50 mg daily resumed on admission Imdur and metoprolol were not resumed on admission due to blackout episodes and pending workup AM team to resume when the benefits outweigh the risk Hydralazine 5 mg IV every 6 hours as needed for SBP greater 170, 5 days ordered  Chart reviewed.   DVT prophylaxis: Heparin 5000 units subcutaneous every 8 hours Code Status: Full code Diet: Heart healthy Family Communication: No Disposition Plan: Pending clinical course Consults called: None at this time, a.m. team to consult cardiology/vascular/neurology if appropriate Admission status: Telemetry cardiac, inpatient  Past Medical History:  Diagnosis Date   Acute diverticulitis 04/2021   [facility] ER, CT confirmed   Allergy    ANA positive    positive ANA pattern 1 speckled   Arthritis    Carotid stenosis, asymptomatic 06/19/2015   1-39% RICA 40-59% LICA rpt 1 yr (05/2015)    CHF (congestive heart failure) (HCC)    Colon polyps    COVID-19 virus infection 09/14/2021   Dermatomyositis (HCC)    Diabetes mellitus without complication (HCC)    Type 1   Diverticulosis    sigmoid on CT scan 12/2019   Family history of adverse reaction to anesthesia    brothr went into cardiac arrest from anectine   Fibromyalgia    prior PCP   GERD (gastroesophageal reflux disease)    prior PCP   Glaucoma    Narrow angle   History of blood clots    DVT, in 20s, none since   History of chicken pox    History of diverticulitis    History of pericarditis 1986   with hospitalization   History of pneumonia 2014   History of shingles    History of UTI    Hyperlipidemia    Hypertension    Hypothyroidism    Mixed connective tissue disease (HCC)    Partial small bowel obstruction (HCC) 12/2019   managed  conservatively   Peptic ulcer    Pneumonia    PONV (postoperative nausea and vomiting)    Raynaud's disease without gangrene    Shoulder pain left   h/o RTC tendonitis and adhesive capsulitis   Sigmoid diverticulitis 05/26/2021   Sjogren's syndrome (HCC)    Sleep apnea    prior PCP - no CPAP for about 10 yrs   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HCC)    Vitamin D deficiency    prior PCP   Past Surgical History:  Procedure Laterality Date   ABDOMINAL HYSTERECTOMY  1978   fibroids and menorrhagia, ovaries remain   ARTERY BIOPSY Right 04/06/2018   Procedure: BIOPSY TEMPORAL ARTERY RIGHT;  Surgeon: Linus Salmons, MD;  Location: [facility];  Service: ENT;  Laterality: Right;  Diabetic - insulin pump sleep apnea   CARDIAC CATHETERIZATION  2000   [facility] normal per patient   COLONOSCOPY  10/2011   1 TA, 1 HP, very tortuous colon ([place])   COLONOSCOPY  02/2020   TA, inflammatory polyp, mod diverticulosis, int/ext hemorrhoids ([place]) no rpt recommended    COLONOSCOPY WITH ESOPHAGOGASTRODUODENOSCOPY (EGD)  03/2007   2 ulcers, benign polyp, rpt 5 yrs [facility])   PARTIAL HIP ARTHROPLASTY Right 2013  hip replacement   RIGHT HEART CATH AND CORONARY ANGIOGRAPHY Bilateral 12/02/2022   Procedure: RIGHT HEART CATH AND CORONARY ANGIOGRAPHY;  Surgeon: Yvonne Kendall, MD;  Location: [facility] INVASIVE CV LAB;  Service: Cardiovascular;  Laterality: Bilateral;   TONSILLECTOMY     TONSILLECTOMY AND ADENOIDECTOMY     TRANSCAROTID ARTERY REVASCULARIZATION  Left 07/23/2018   Procedure: TRANSCAROTID ARTERY REVASCULARIZATION;  Surgeon: Cephus Shelling, MD;  Location: [facility] OR;  Service: Vascular;  Laterality: Left;   TUBAL LIGATION     VAGINAL DELIVERY     x2, no complications   Social History:  reports that she has never smoked. She has never used smokeless tobacco. She reports that she does not drink alcohol and does not use drugs.  Allergies  Allergen Reactions   Iodinated  Contrast Media Other (See Comments)    Itching (severe) and chest tightness   Penicillins Anaphylaxis, Swelling, Rash and Other (See Comments)    Has patient had a PCN reaction causing immediate rash, facial/tongue/throat swelling, SOB or lightheadedness with hypotension: Yes Has patient had a PCN reaction causing severe rash involving mucus membranes or skin necrosis: yes - remotely Has patient had a PCN reaction that required hospitalization: occurred while hospitalized Has patient had a PCN reaction occurring within the last 10 years: No If all of the above answers are "NO", then may proceed with Cephalosporin use.    Amlodipine Swelling    Pedal edema   Anectine [Succinylcholine] Other (See Comments)    Brother went into cardiac arrest.   Carvedilol Dermatitis    Facial acne   Codeine Nausea Only   Gabapentin Other (See Comments)    Gait abnormality   Influenza Vaccines Other (See Comments)    Muscle weakness; unable to walk   Insulin Aspart (Human Analog) Other (See Comments)    headache   Nortriptyline Other (See Comments)    Eye swelling and mouth drawed up   Prednisone     Marked severe hyperglycemia to oral and IM steroids   Valsartan Other (See Comments) and Cough    Allergy to generic only, "Hacking" cough   Zetia [Ezetimibe] Other (See Comments)    Bad muscle cramps   Erythromycin Rash and Swelling   Sulfa Antibiotics Rash   Family History  Problem Relation Age of Onset   CAD Mother 72       MI, aortic valve issues   COPD Mother    Lupus Mother    Luiz Blare' disease Mother    Rheum arthritis Mother    CAD Father 34       CABG x2, aortic valve replacement   Stroke Sister    CAD Sister    Anuerysm Sister        brain   Lupus Sister    Diabetes Sister    Diabetes Sister    Breast cancer Sister    Alcohol abuse Brother    CAD Brother 39       MI   Stroke Brother    COPD Brother        agent orange   CAD Brother 60       stent   Diabetes Brother     Stroke Maternal Grandmother    Hypertension Maternal Grandmother    Gallbladder disease Maternal Grandmother    Breast cancer Maternal Aunt    Breast cancer Maternal Aunt    Depression Grandchild    Colon cancer Neg Hx    Esophageal cancer Neg Hx    Rectal  cancer Neg Hx    Stomach cancer Neg Hx    Family history: Family history reviewed and not pertinent.  Prior to Admission medications   Medication Sig Start Date End Date Taking? Authorizing Provider  acetaminophen (TYLENOL) 500 MG tablet Take 1,000 mg by mouth daily. 01/03/20   Eustaquio Boyden, MD  albuterol (VENTOLIN HFA) 108 (90 Base) MCG/ACT inhaler Inhale 2 puffs into the lungs every 6 (six) hours as needed. 05/30/23   Salena Saner, MD  Ascorbic Acid (VITAMIN C) 100 MG tablet Take 100 mg by mouth daily.    [provider]  aspirin EC 81 MG tablet Take 1 tablet (81 mg total) by mouth daily. Swallow whole. 05/04/22   Eustaquio Boyden, MD  Blood Glucose Monitoring Suppl DEVI 1 each by Does not apply route in the morning, at noon, and at bedtime. E13.319. May substitute to any manufacturer covered by patient's insurance. 11/11/23   Eustaquio Boyden, MD  Cholecalciferol (VITAMIN D3) 25 MCG (1000 UT) CAPS Take 2 capsules (2,000 Units total) by mouth daily. 11/04/22   Eustaquio Boyden, MD  clopidogrel (PLAVIX) 75 MG tablet TAKE 1 TABLET(75 MG) BY MOUTH EVERY EVENING 11/07/23   Cephus Shelling, MD  diclofenac Sodium (VOLTAREN) 1 % GEL Apply 2 g topically 3 (three) times daily. 06/22/22   Eustaquio Boyden, MD  Fluticasone-Umeclidin-Vilant (TRELEGY ELLIPTA) 100-62.5-25 MCG/ACT AEPB Inhale 1 puff into the lungs daily. 01/26/23   Salena Saner, MD  Glucagon, rDNA, (GLUCAGON EMERGENCY) 1 MG KIT INJECT into THE muscle ONCE AS NEEDED FOR emergency 11/18/21   Carlus Pavlov, MD  Glucose Blood (BLOOD GLUCOSE TEST STRIPS) STRP 1 each by In Vitro route in the morning, at noon, and at bedtime. May substitute to any manufacturer covered  by patient's insurance. 11/11/23 12/11/23  Eustaquio Boyden, MD  insulin degludec (TRESIBA FLEXTOUCH) 100 UNIT/ML FlexTouch Pen Inject 60 Units into the skin at bedtime. 11/08/23   Eustaquio Boyden, MD  insulin lispro (HUMALOG) 100 UNIT/ML injection Inject 0.15 mLs (15 Units total) into the skin 3 (three) times daily before meals. 11/08/23   Eustaquio Boyden, MD  isosorbide mononitrate (IMDUR) 30 MG 24 hr tablet Take 0.5 tablets (15 mg total) by mouth daily. 12/02/22 12/02/23  End, Cristal Deer, MD  Lancet Device MISC 1 each by Does not apply route in the morning, at noon, and at bedtime. May substitute to any manufacturer covered by patient's insurance. 11/11/23 12/11/23  Eustaquio Boyden, MD  Lancets Misc. MISC 1 each by Does not apply route in the morning, at noon, and at bedtime. May substitute to any manufacturer covered by patient's insurance. 11/11/23 12/11/23  Eustaquio Boyden, MD  losartan (COZAAR) 50 MG tablet Take 1 tablet (50 mg total) by mouth daily. 10/09/23   Eustaquio Boyden, MD  metoprolol succinate (TOPROL-XL) 25 MG 24 hr tablet TAKE 1 TABLET(25 MG) BY MOUTH DAILY 08/30/23   Clarisa Kindred A, FNP  Multiple Vitamin (MULTIVITAMIN ADULT) TABS Take 1 tablet by mouth in the morning and at bedtime. 01/18/22   Eustaquio Boyden, MD  nitroGLYCERIN (NITROSTAT) 0.4 MG SL tablet Place 1 tablet (0.4 mg total) under the tongue every 5 (five) minutes as needed for chest pain. 12/02/22 12/02/23  End, Cristal Deer, MD  rosuvastatin (CRESTOR) 20 MG tablet Take 1 tablet (20 mg total) by mouth every evening. 11/11/23   Eustaquio Boyden, MD  SYNTHROID 75 MCG tablet TAKE ONE TABLET BY MOUTH Monday-Saturday BEFORE breakfast 11/11/23   Eustaquio Boyden, MD  topiramate (TOPAMAX) 50 MG tablet Take  1 tablet (50 mg total) by mouth daily. 11/11/23   Eustaquio Boyden, MD  torsemide (DEMADEX) 20 MG tablet Take 2 tablets (40 mg total) by mouth daily. 04/18/23   Delma Freeze, FNP   Physical Exam: Vitals:   11/30/23 1352  11/30/23 1358 11/30/23 1800  BP: (!) 145/63  (!) 142/77  Pulse: (!) 101  95  Resp: 19  18  Temp: 98.3 F (36.8 C)  98.7 F (37.1 C)  TempSrc: Oral  Oral  SpO2: 98%  100%  Weight:  67.6 kg    Constitutional: appears age-appropriate, frail, NAD Eyes: PERRL, lids and conjunctivae normal ENMT: Mucous membranes are moist. Posterior pharynx clear of any exudate or lesions. Age-appropriate dentition. Hearing appropriate Neck: normal, supple, no masses, no thyromegaly Respiratory: clear to auscultation bilaterally, no wheezing, no crackles. Normal respiratory effort. No accessory muscle use.  Cardiovascular: Regular rate and rhythm, no murmurs / rubs / gallops. No extremity edema. 2+ pedal pulses. No carotid bruits.  Abdomen: no tenderness, no masses palpated, no hepatosplenomegaly. Bowel sounds positive.  Musculoskeletal: no clubbing / cyanosis. No joint deformity upper and lower extremities. Good ROM, no contractures, no atrophy. Normal muscle tone.  Skin: no rashes, lesions, ulcers. No induration Neurologic: Sensation intact. Strength 5/5 in all 4.  Psychiatric: Normal judgment and insight. Alert and oriented x 3. Normal mood.   EKG: independently reviewed, showing sinus rhythm with rate of 92, QTc 452  Chest x-ray on Admission: I personally reviewed and I agree with radiologist reading as below.  CT Head Wo Contrast Result Date: 11/30/2023 CLINICAL DATA:  Trauma, fall, near syncope. EXAM: CT HEAD WITHOUT CONTRAST CT CERVICAL SPINE WITHOUT CONTRAST TECHNIQUE: Multidetector CT imaging of the head and cervical spine was performed following the standard protocol without intravenous contrast. Multiplanar CT image reconstructions of the cervical spine were also generated. RADIATION DOSE REDUCTION: This exam was performed according to the departmental dose-optimization program which includes automated exposure control, adjustment of the mA and/or kV according to patient size and/or use of iterative  reconstruction technique. COMPARISON:  None Available. CT head 11/12/2021. FINDINGS: CT HEAD FINDINGS Brain: No acute intracranial hemorrhage. No CT evidence of acute infarct. No edema, mass effect, or midline shift. The basilar cisterns are patent. Ventricles: Prominence of the ventricles suggesting underlying parenchymal volume loss. Vascular: Atherosclerotic calcifications of the carotid siphons. No hyperdense vessel. Skull: No acute or aggressive finding. Orbits: Orbits are symmetric. Sinuses: Mild mucosal thickening in the ethmoid sinuses. Other: Mastoid air cells are clear. CT CERVICAL SPINE FINDINGS Alignment: Straightening of the normal cervical lordosis. No listhesis. No facet subluxation or dislocation. Skull base and vertebrae: No compression fracture or displaced fracture in the cervical spine. No suspicious osseous lesion. Soft tissues and spinal canal: No prevertebral fluid or swelling. No visible canal hematoma. Heterotopic calcification noted within the posterior soft tissues adjacent to the C5 spinous process. Stent noted within the left internal carotid artery. Stent patency cannot be evaluated. Hypoplastic appearance of the thyroid. Disc levels: Moderate disc space narrowing at C5-6 and C6-7. Degenerative endplate osteophytes greatest at C5-6. Disc bulges and disc osteophyte complexes at multiple levels. There is mild spinal canal narrowing at C5-6 and C6-7. Facet arthrosis at multiple levels. Foraminal narrowing at multiple levels greatest at C5-6. Upper chest: Biapical pleuroparenchymal scarring. 3 mm nodule in the left lung apex, likely present on prior chest CT from 2023 and unchanged since that time. Other: None. IMPRESSION: 1. No acute intracranial abnormality. 2. No acute fracture or traumatic  malalignment in the cervical spine. 3. Degenerative changes of the cervical spine as above. Electronically Signed   By: Emily Filbert M.D.   On: 11/30/2023 17:08   CT Cervical Spine Wo  Contrast Result Date: 11/30/2023 CLINICAL DATA:  Trauma, fall, near syncope. EXAM: CT HEAD WITHOUT CONTRAST CT CERVICAL SPINE WITHOUT CONTRAST TECHNIQUE: Multidetector CT imaging of the head and cervical spine was performed following the standard protocol without intravenous contrast. Multiplanar CT image reconstructions of the cervical spine were also generated. RADIATION DOSE REDUCTION: This exam was performed according to the departmental dose-optimization program which includes automated exposure control, adjustment of the mA and/or kV according to patient size and/or use of iterative reconstruction technique. COMPARISON:  None Available. CT head 11/12/2021. FINDINGS: CT HEAD FINDINGS Brain: No acute intracranial hemorrhage. No CT evidence of acute infarct. No edema, mass effect, or midline shift. The basilar cisterns are patent. Ventricles: Prominence of the ventricles suggesting underlying parenchymal volume loss. Vascular: Atherosclerotic calcifications of the carotid siphons. No hyperdense vessel. Skull: No acute or aggressive finding. Orbits: Orbits are symmetric. Sinuses: Mild mucosal thickening in the ethmoid sinuses. Other: Mastoid air cells are clear. CT CERVICAL SPINE FINDINGS Alignment: Straightening of the normal cervical lordosis. No listhesis. No facet subluxation or dislocation. Skull base and vertebrae: No compression fracture or displaced fracture in the cervical spine. No suspicious osseous lesion. Soft tissues and spinal canal: No prevertebral fluid or swelling. No visible canal hematoma. Heterotopic calcification noted within the posterior soft tissues adjacent to the C5 spinous process. Stent noted within the left internal carotid artery. Stent patency cannot be evaluated. Hypoplastic appearance of the thyroid. Disc levels: Moderate disc space narrowing at C5-6 and C6-7. Degenerative endplate osteophytes greatest at C5-6. Disc bulges and disc osteophyte complexes at multiple levels. There is  mild spinal canal narrowing at C5-6 and C6-7. Facet arthrosis at multiple levels. Foraminal narrowing at multiple levels greatest at C5-6. Upper chest: Biapical pleuroparenchymal scarring. 3 mm nodule in the left lung apex, likely present on prior chest CT from 2023 and unchanged since that time. Other: None. IMPRESSION: 1. No acute intracranial abnormality. 2. No acute fracture or traumatic malalignment in the cervical spine. 3. Degenerative changes of the cervical spine as above. Electronically Signed   By: Emily Filbert M.D.   On: 11/30/2023 17:08   US Venous Img Lower Bilateral Result Date: 11/30/2023 CLINICAL DATA:  Weakness. EXAM: BILATERAL LOWER EXTREMITY VENOUS DOPPLER ULTRASOUND TECHNIQUE: Gray-scale sonography with graded compression, as well as color Doppler and duplex ultrasound were performed to evaluate the lower extremity deep venous systems from the level of the common femoral vein and including the common femoral, femoral, profunda femoral, popliteal and calf veins including the posterior tibial, peroneal and gastrocnemius veins when visible. The superficial great saphenous vein was also interrogated. Spectral Doppler was utilized to evaluate flow at rest and with distal augmentation maneuvers in the common femoral, femoral and popliteal veins. COMPARISON:  Ultrasound 01/13/2023. FINDINGS: RIGHT LOWER EXTREMITY Common Femoral Vein: No evidence of thrombus. Normal compressibility, respiratory phasicity and response to augmentation. Saphenofemoral Junction: No evidence of thrombus. Normal compressibility and flow on color Doppler imaging. Profunda Femoral Vein: No evidence of thrombus. Normal compressibility and flow on color Doppler imaging. Femoral Vein: No evidence of thrombus. Normal compressibility, respiratory phasicity and response to augmentation. Popliteal Vein: No evidence of thrombus. Normal compressibility, respiratory phasicity and response to augmentation. Calf Veins: No evidence of  thrombus. Normal compressibility and flow on color Doppler imaging. Superficial Great Saphenous  Vein: No evidence of thrombus. Normal compressibility. Venous Reflux:  None. Other Findings:  None. LEFT LOWER EXTREMITY Common Femoral Vein: No evidence of thrombus. Normal compressibility, respiratory phasicity and response to augmentation. Saphenofemoral Junction: No evidence of thrombus. Normal compressibility and flow on color Doppler imaging. Profunda Femoral Vein: No evidence of thrombus. Normal compressibility and flow on color Doppler imaging. Femoral Vein: No evidence of thrombus. Normal compressibility, respiratory phasicity and response to augmentation. Popliteal Vein: No evidence of thrombus. Normal compressibility, respiratory phasicity and response to augmentation. Calf Veins: No evidence of thrombus. Normal compressibility and flow on color Doppler imaging. Superficial Great Saphenous Vein: No evidence of thrombus. Normal compressibility. Venous Reflux:  None. Other Findings:  None. IMPRESSION: No evidence of deep venous thrombosis in either lower extremity. Electronically Signed   By: Karen Kays M.D.   On: 11/30/2023 16:58   DG Chest 2 View Result Date: 11/30/2023 CLINICAL DATA:  Syncope. EXAM: CHEST - 2 VIEW COMPARISON:  X-ray 07/20/2023 FINDINGS: The heart size and mediastinal contours are within normal limits. No consolidation, pneumothorax or effusion. No edema. The visualized skeletal structures are unremarkable. IMPRESSION: No acute cardiopulmonary disease. Electronically Signed   By: Karen Kays M.D.   On: 11/30/2023 16:56   Labs on Admission: I have personally reviewed following labs  CBC: Recent Labs  Lab 11/30/23 1406  WBC 11.8*  HGB 15.2*  HCT 46.4*  MCV 83.8  PLT 239   Basic Metabolic Panel: Recent Labs  Lab 11/30/23 1406  NA 142  K 3.4*  CL 103  CO2 26  GLUCOSE 85  BUN 17  CREATININE 0.85  CALCIUM 10.5*   GFR: Estimated Creatinine Clearance: 47.6 mL/min (by  C-G formula based on SCr of 0.85 mg/dL).  CBG: Recent Labs  Lab 11/30/23 1409 11/30/23 1741  GLUCAP 75 69*   Urine analysis:    Component Value Date/Time   COLORURINE COLORLESS (A) 05/10/2022 1000   APPEARANCEUR CLEAR (A) 05/10/2022 1000   LABSPEC 1.014 05/10/2022 1000   PHURINE 6.0 05/10/2022 1000   GLUCOSEU >=500 (A) 05/10/2022 1000   HGBUR NEGATIVE 05/10/2022 1000   BILIRUBINUR NEGATIVE 05/10/2022 1000   BILIRUBINUR negative 02/25/2022 1553   KETONESUR NEGATIVE 05/10/2022 1000   PROTEINUR NEGATIVE 05/10/2022 1000   UROBILINOGEN 0.2 02/25/2022 1553   NITRITE NEGATIVE 05/10/2022 1000   LEUKOCYTESUR NEGATIVE 05/10/2022 1000   This document was prepared using Dragon Voice Recognition software and may include unintentional dictation errors.  Dr. Sedalia Muta [facility]  If 7PM-7AM, please contact overnight-coverage provider If 7AM-7PM, please contact day attending provider [email or web address]  11/30/2023, 6:38 PM

## 2023-11-30 NOTE — ED Notes (Signed)
 Pt was given ice cream at request

## 2023-11-30 NOTE — ED Triage Notes (Signed)
 First Nurse Note: Patient to ED via [facility] from home for near syncope. Ongoing for a couple days. Hx of CHF, COPD, and diabetes. Aox4   EMS VS: 98% RA 157/76 88 cbg 90 HR 18 rr

## 2023-11-30 NOTE — Assessment & Plan Note (Addendum)
 Aspirin 81 mg daily, Plavix 75 mg every evening were resumed

## 2023-12-01 ENCOUNTER — Inpatient Hospital Stay

## 2023-12-01 ENCOUNTER — Inpatient Hospital Stay (HOSPITAL_COMMUNITY)
Admit: 2023-12-01 | Discharge: 2023-12-01 | Disposition: A | Discharge status: Home / Self Care | Attending: Internal Medicine | Admitting: Internal Medicine

## 2023-12-01 DIAGNOSIS — R55 Syncope and collapse: Secondary | ICD-10-CM

## 2023-12-01 LAB — ECHOCARDIOGRAM COMPLETE
AR max vel: 2.14 cm2
AV Area VTI: 2.14 cm2
AV Area mean vel: 1.94 cm2
AV Mean grad: 5 mmHg
AV Peak grad: 9 mmHg
Ao pk vel: 1.5 m/s
Area-P 1/2: 2.99 cm2
Calc EF: 61.9 %
MV VTI: 1.9 cm2
S' Lateral: 2.6 cm
Single Plane A2C EF: 62.9 %
Single Plane A4C EF: 60.8 %
Weight: 2384 [oz_av]

## 2023-12-01 LAB — BASIC METABOLIC PANEL
Anion gap: 12 (ref 5–15)
BUN: 16 mg/dL (ref 8–23)
CO2: 26 mmol/L (ref 22–32)
Calcium: 9.5 mg/dL (ref 8.9–10.3)
Chloride: 105 mmol/L (ref 98–111)
Creatinine, Ser: 0.8 mg/dL (ref 0.44–1.00)
GFR, Estimated: >60 mL/min (ref >60)
Glucose, Bld: 90 mg/dL (ref 70–99)
Potassium: 3.5 mmol/L (ref 3.5–5.1)
Sodium: 143 mmol/L (ref 135–145)

## 2023-12-01 LAB — CBC
HCT: 40.5 % (ref 36.0–46.0)
Hemoglobin: 13.4 g/dL (ref 12.0–15.0)
MCH: 27.5 pg (ref 26.0–34.0)
MCHC: 33.1 g/dL (ref 30.0–36.0)
MCV: 83.2 fL (ref 80.0–100.0)
Platelets: 207 10*3/uL (ref 150–400)
RBC: 4.87 MIL/uL (ref 3.87–5.11)
RDW: 14 % (ref 11.5–15.5)
WBC: 9.1 10*3/uL (ref 4.0–10.5)
nRBC: 0 % (ref 0.0–0.2)

## 2023-12-01 LAB — CBG MONITORING, ED
Glucose-Capillary: 79 mg/dL (ref 70–99)
Glucose-Capillary: 60 mg/dL — ABNORMAL LOW (ref 70–99)
Glucose-Capillary: 81 mg/dL (ref 70–99)
Glucose-Capillary: 290 mg/dL — ABNORMAL HIGH (ref 70–99)

## 2023-12-01 MED ORDER — TORSEMIDE 20 MG PO TABS: 20.0000 mg | ORAL_TABLET | Freq: Every day | ORAL | 5 refills | Status: DC

## 2023-12-01 MED ORDER — LOSARTAN POTASSIUM 50 MG PO TABS: 25.0000 mg | ORAL_TABLET | Freq: Every day | ORAL | Status: DC

## 2023-12-01 MED ORDER — LACTATED RINGERS IV BOLUS
500.0000 mL | Freq: Once | INTRAVENOUS | Status: AC
  Administered 2023-12-01: 500 mL via INTRAVENOUS

## 2023-12-01 MED ORDER — TRESIBA FLEXTOUCH 100 UNIT/ML ~~LOC~~ SOPN: 30.0000 [IU] | PEN_INJECTOR | Freq: Every day | SUBCUTANEOUS | Status: DC

## 2023-12-01 NOTE — Progress Notes (Signed)
*  PRELIMINARY RESULTS* Echocardiogram 2D Echocardiogram has been performed.  Carolyne Fiscal 12/01/2023, 10:44 AM

## 2023-12-01 NOTE — ED Notes (Signed)
Pt assisted to bedside at this time

## 2023-12-01 NOTE — ED Notes (Signed)
Called daughter and left a voicemail.

## 2023-12-01 NOTE — ED Notes (Signed)
 Pt woke from sleep to complete echocardiogram at this time.

## 2023-12-01 NOTE — ED Notes (Signed)
 Patient assisted to bedside commode at this time.

## 2023-12-01 NOTE — ED Notes (Signed)
 Patient given 8oz of orange juice at this time. Pt repositioned in bed.

## 2023-12-01 NOTE — ED Notes (Signed)
 BP lying: 140/57 HR lying: 75 BP sitting: 125/69 HR sitting: 87 BP standing: 143/60 HR standing: 89  MD saw patient and discussed plan at bedside.

## 2023-12-01 NOTE — Discharge Summary (Signed)
 Physician Discharge Summary   Patient: Cassandra Benson MRN: [id number] DOB: [birth year]  Admit date:     11/30/2023  Discharge date: 12/01/23  Discharge Physician: Deanna Artis   PCP: Eustaquio Boyden, MD   Recommendations at discharge:   At this time patient will be discharged home.  If you experience any symptoms such as fever, vomiting, shortness of breath, chest pain, abdominal pain, or other concerning symptoms, please call your primary care provider or go to the emergency department immediately.  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 [facility] Course: Cassandra Benson is 83 female with history of IDDM2, hypertension, hyperlipidemia, who presents emergency department for chief concerns of syncope.  Assessment and Plan:  Possible syncope secondary to orthostatic hypotension - Discussion with the patient stating that she would get very dizzy, blackout when standing.  Concerns for multifactorial etiology including orthostatic hypotension, medication induced hypotension, hypoglycemia, dehydration.  Received IV fluid bolus, held antihypertensives and insulin with improvement in blood pressure.  Nothing observed on telemetry.  MRI showing no acute findings.  Echo showing normal ejection fraction.  Chest x-ray personally reviewed noting no consolidations.  Other labs unremarkable.  Upon discharge will recommend patient decrease dose of losartan and stop beta-blocker.  Will also decrease nightly insulin from 60 units down to 30 units.  Recommend patient continue to  check her blood pressure and blood sugar at home.  Follow-up with primary care physician in 1 week.    PAD (peripheral artery disease)  A-resume aspirin Plavix  Hyperlipidemia associated with type 2 diabetes mellitus  -Home rosuvastatin   Hypothyroidism due to Hashimoto's thyroiditis -Home levothyroxine 75 mcg  Essential hypertension -Reduce losartan to 25 mg daily.  Discontinue metoprolol.  Resume Imdur.  Continue to monitor blood pressure at home.          Consultants: None Procedures performed: None Disposition: Home Diet recommendation:  Discharge Diet Orders (From admission, onward)     Start     Ordered   12/01/23 0000  Diet - low sodium heart healthy        12/01/23 1342           Cardiac and Carb modified diet DISCHARGE MEDICATION: Allergies as of 12/01/2023       Reactions   Iodinated Contrast Media Other (See Comments)   Itching (severe) and chest tightness   Penicillins Anaphylaxis, Swelling, Rash, Other (See Comments)   Has patient had a PCN reaction causing immediate rash, facial/tongue/throat swelling, SOB or lightheadedness with hypotension: Yes Has patient had a PCN reaction causing severe rash involving mucus membranes or skin necrosis: yes - remotely Has patient had a PCN reaction that required hospitalization: occurred while hospitalized Has patient had a PCN reaction occurring within the last 10 years: No If all of the above answers are "NO", then may proceed with Cephalosporin use.   Amlodipine Swelling   Pedal edema   Anectine [succinylcholine] Other (See Comments)   Brother went into cardiac arrest.   Carvedilol Dermatitis   Facial acne   Codeine Nausea Only   Gabapentin Other (See Comments)   Gait  abnormality   Influenza Vaccines Other (See Comments)   Muscle weakness; unable to walk   Insulin Aspart (human Analog) Other (See Comments)   headache   Nortriptyline Other (See Comments)   Eye swelling and mouth drawed up   Prednisone     Marked severe hyperglycemia to oral and IM steroids   Valsartan Other (See Comments), Cough   Allergy to generic only, "Hacking" cough   Zetia [ezetimibe] Other (See Comments)   Bad muscle cramps   Erythromycin Rash, Swelling   Sulfa Antibiotics Rash        Medication List     STOP taking these medications    metoprolol succinate 25 MG 24 hr tablet Commonly known as: TOPROL-XL       TAKE these medications    acetaminophen 500 MG tablet Commonly known as: TYLENOL Take 1,000 mg by mouth daily.   albuterol 108 (90 Base) MCG/ACT inhaler Commonly known as: VENTOLIN HFA Inhale 2 puffs into the lungs every 6 (six) hours as needed.   aspirin EC 81 MG tablet Take 1 tablet (81 mg total) by mouth daily. Swallow whole.   Blood Glucose Monitoring Suppl Devi 1 each by Does not apply route in the morning, at noon, and at bedtime. E13.319. May substitute to any manufacturer covered by patient's insurance.   BLOOD GLUCOSE TEST STRIPS Strp 1 each by In Vitro route in the morning, at noon, and at bedtime. May substitute to any manufacturer covered by patient's insurance.   [name] tablet Commonly known as: PLAVIX TAKE 1 TABLET(75 MG) BY MOUTH EVERY EVENING   diclofenac Sodium 1 % Gel Commonly known as: VOLTAREN Apply 2 g topically 3 (three) times daily.   Glucagon Emergency 1 MG Kit INJECT into THE muscle ONCE AS NEEDED FOR emergency   insulin lispro 100 UNIT/ML injection Commonly known as: HUMALOG Inject 0.15 mLs (15 Units total) into the skin 3 (three) times daily before meals.   isosorbide mononitrate 30 MG 24 hr tablet Commonly known as: IMDUR Take 0.5 tablets (15 mg total) by mouth daily.   Lancet Device Misc 1 each by Does not apply route in the morning, at noon, and at bedtime. May substitute to any manufacturer covered by patient's insurance.   Lancets Misc. Misc 1 each by Does not apply route in the morning, at noon, and at bedtime. May substitute to  any manufacturer covered by patient's insurance.   losartan 50 MG tablet Commonly known as: COZAAR Take 0.5 tablets (25 mg total) by mouth daily. What changed: how much to take   Multivitamin Adult Tabs Take 1 tablet by mouth in the morning and at bedtime.   nitroGLYCERIN 0.4 MG SL tablet Commonly known as: Nitrostat Place 1 tablet (0.4 mg total) under the tongue every 5 (five) minutes as needed for chest pain.   rosuvastatin 20 MG tablet Commonly known as: CRESTOR Take 1 tablet (20 mg total) by mouth every evening.   Synthroid 75 MCG tablet Generic drug: levothyroxine TAKE ONE TABLET BY MOUTH Monday-Saturday BEFORE breakfast   topiramate 50 MG tablet Commonly known as: Topamax Take 1 tablet (50 mg total) by mouth daily.   torsemide 20 MG tablet Commonly known as: DEMADEX Take 1 tablet (20 mg total) by mouth daily. What changed: how much to take   Trelegy Ellipta 100-62.5-25 MCG/ACT Aepb Generic drug: Fluticasone-Umeclidin-Vilant Inhale 1 puff into the lungs daily.   Evaristo Bury FlexTouch 100 UNIT/ML FlexTouch Pen Generic drug: insulin degludec Inject 30 Units into the  skin at bedtime. What changed: how much to take   vitamin C 100 MG tablet Take 100 mg by mouth daily.   Vitamin D3 25 MCG (1000 UT) Caps Take 2 capsules (2,000 Units total) by mouth daily.        Discharge Exam: Filed Weights   11/30/23 1358  Weight: 67.6 kg   GENERAL:  Alert, pleasant, no acute distress  HEENT:  EOMI CARDIOVASCULAR:  RRR, no murmurs appreciated RESPIRATORY:  Clear to auscultation, no wheezing, rales, or rhonchi GASTROINTESTINAL:  Soft, nontender, nondistended EXTREMITIES:  No LE edema bilaterally NEURO:  No new focal deficits appreciated SKIN:  No rashes noted PSYCH:  Appropriate mood and affect    Condition at discharge: improving  The results of significant diagnostics from this hospitalization (including imaging, microbiology, ancillary and laboratory) are listed  below for reference.   Imaging Studies: ECHOCARDIOGRAM COMPLETE Result Date: 12/01/2023    ECHOCARDIOGRAM REPORT   Patient Name:   ELOYSE CAUSEY Date of Exam: 12/01/2023 Medical Rec #:  [id number]              Height:       62.8 in Accession #:    [id number]             Weight:       149.0 lb Date of Birth:  1941-05-14              BSA:          1.702 m Patient Age:    82 years               BP:           129/100 mmHg Patient Gender: F                      HR:           74 bpm. Exam Location:  [facility] Procedure: 2D Echo, Cardiac Doppler, Color Doppler and Strain Analysis (Both            Spectral and Color Flow Doppler were utilized during procedure). Indications:     Syncope  History:         Patient has prior history of Echocardiogram examinations, most                  recent 12/23/2022. CHF, CAD, PAD and TIA,                  Signs/Symptoms:Syncope, Chest Pain and Dyspnea; Risk                  Factors:Hypertension, Dyslipidemia and Sleep Apnea.  Sonographer:     Mikki Harbor Referring Phys:  [id number] AMY N COX Diagnosing Phys: Debbe Odea MD  Sonographer Comments: Technically difficult study due to poor echo windows and suboptimal subcostal window. Image acquisition challenging due to respiratory motion. IMPRESSIONS  1. Left ventricular ejection fraction, by estimation, is 60 to 65%. Left ventricular ejection fraction by 2D MOD biplane is 61.9 %. The left ventricle has normal function. The left ventricle has no regional wall motion abnormalities. Left ventricular diastolic parameters are consistent with Grade I diastolic dysfunction (impaired relaxation).  2. Right ventricular systolic function is normal. The right ventricular size is normal. There is normal pulmonary artery systolic pressure.  3. The mitral valve is normal in structure. No evidence of mitral valve regurgitation.  4. The aortic valve is tricuspid. Aortic valve regurgitation is not visualized. Aortic valve  sclerosis is present,  with no evidence of aortic valve stenosis. Aortic valve mean gradient measures 5.0 mmHg. FINDINGS  Left Ventricle: Left ventricular ejection fraction, by estimation, is 60 to 65%. Left ventricular ejection fraction by 2D MOD biplane is 61.9 %. The left ventricle has normal function. The left ventricle has no regional wall motion abnormalities. Global  longitudinal strain performed but not reported based on interpreter judgement due to suboptimal tracking. The left ventricular internal cavity size was normal in size. There is no left ventricular hypertrophy. Left ventricular diastolic parameters are consistent with Grade I diastolic dysfunction (impaired relaxation). Right Ventricle: The right ventricular size is normal. No increase in right ventricular wall thickness. Right ventricular systolic function is normal. There is normal pulmonary artery systolic pressure. The tricuspid regurgitant velocity is 1.79 m/s, and  with an assumed right atrial pressure of 8 mmHg, the estimated right ventricular systolic pressure is 20.8 mmHg. Left Atrium: Left atrial size was normal in size. Right Atrium: Right atrial size was normal in size. Pericardium: There is no evidence of pericardial effusion. Mitral Valve: The mitral valve is normal in structure. No evidence of mitral valve regurgitation. MV peak gradient, 5.3 mmHg. The mean mitral valve gradient is 3.0 mmHg. Tricuspid Valve: The tricuspid valve is normal in structure. Tricuspid valve regurgitation is trivial. Aortic Valve: The aortic valve is tricuspid. Aortic valve regurgitation is not visualized. Aortic valve sclerosis is present, with no evidence of aortic valve stenosis. Aortic valve mean gradient measures 5.0 mmHg. Aortic valve peak gradient measures 9.0  mmHg. Aortic valve area, by VTI measures 2.14 cm. Pulmonic Valve: The pulmonic valve was not well visualized. Pulmonic valve regurgitation is not visualized. Aorta: The aortic root is normal in size and structure.  IAS/Shunts: No atrial level shunt detected by color flow Doppler. Additional Comments: 3D was performed not requiring image post processing on an independent workstation and was indeterminate.  LEFT VENTRICLE PLAX 2D                        Biplane EF (MOD) LVIDd:         3.90 cm         LV Biplane EF:   Left LVIDs:         2.60 cm                          ventricular LV PW:         1.00 cm                          ejection LV IVS:        0.90 cm                          fraction by LVOT diam:     1.90 cm                          2D MOD LV SV:         68                               biplane is LV SV Index:   40  61.9 %. LVOT Area:     2.84 cm                                Diastology                                LV e' medial:    7.97 cm/s LV Volumes (MOD)               LV E/e' medial:  10.8 LV vol d, MOD    41.8 ml       LV e' lateral:   6.92 cm/s A2C:                           LV E/e' lateral: 12.4 LV vol d, MOD    43.4 ml A4C: LV vol s, MOD    15.5 ml A2C: LV vol s, MOD    17.0 ml A4C: LV SV MOD A2C:   26.3 ml LV SV MOD A4C:   43.4 ml LV SV MOD BP:    26.8 ml RIGHT VENTRICLE RV Basal diam:  2.85 cm RV Mid diam:    2.30 cm RV S prime:     11.00 cm/s TAPSE (M-mode): 2.0 cm LEFT ATRIUM             Index        RIGHT ATRIUM           Index LA diam:        3.00 cm 1.76 cm/m   RA Area:     10.90 cm LA Vol (A2C):   33.2 ml 19.51 ml/m  RA Volume:   22.30 ml  13.11 ml/m LA Vol (A4C):   41.8 ml 24.57 ml/m LA Biplane Vol: 38.5 ml 22.63 ml/m  AORTIC VALVE                    PULMONIC VALVE AV Area (Vmax):    2.14 cm     PV Vmax:       1.00 m/s AV Area (Vmean):   1.94 cm     PV Peak grad:  4.0 mmHg AV Area (VTI):     2.14 cm AV Vmax:           150.00 cm/s AV Vmean:          94.600 cm/s AV VTI:            0.320 m AV Peak Grad:      9.0 mmHg AV Mean Grad:      5.0 mmHg LVOT Vmax:         113.00 cm/s LVOT Vmean:        64.700 cm/s LVOT VTI:          0.241 m LVOT/AV VTI ratio: 0.75  AORTA Ao  Root diam: 2.70 cm MITRAL VALVE                TRICUSPID VALVE MV Area (PHT): 2.99 cm     TR Peak grad:   12.8 mmHg MV Area VTI:   1.90 cm     TR Vmax:        179.00 cm/s MV Peak grad:  5.3 mmHg MV Mean grad:  3.0 mmHg     SHUNTS MV Vmax:       1.15 m/s  Systemic VTI:  0.24 m MV Vmean:      74.4 cm/s    Systemic Diam: 1.90 cm MV Decel Time: 254 msec MV E velocity: 85.90 cm/s MV A velocity: 110.00 cm/s MV E/A ratio:  0.78 Debbe Odea MD Electronically signed by Debbe Odea MD Signature Date/Time: 12/01/2023/11:26:19 AM    Final    MR BRAIN WO CONTRAST Result Date: 11/30/2023 CLINICAL DATA:  Neuro deficit, acute, stroke suspected EXAM: MRI HEAD WITHOUT CONTRAST TECHNIQUE: Multiplanar, multiecho pulse sequences of the brain and surrounding structures were obtained without intravenous contrast. COMPARISON:  CT head from today. FINDINGS: Brain: No acute infarction, hemorrhage, hydrocephalus, extra-axial collection or mass lesion. Vascular: Major arterial flow voids are maintained at the skull base. Skull and upper cervical spine: Normal marrow signal. Sinuses/Orbits: Mostly clear sinuses.  No acute orbital findings. Other: No mastoid effusions. IMPRESSION: No evidence of acute intracranial abnormality. Electronically Signed   By: Feliberto Harts M.D.   On: 11/30/2023 23:59   CT Head Wo Contrast Result Date: 11/30/2023 CLINICAL DATA:  Trauma, fall, near syncope. EXAM: CT HEAD WITHOUT CONTRAST CT CERVICAL SPINE WITHOUT CONTRAST TECHNIQUE: Multidetector CT imaging of the head and cervical spine was performed following the standard protocol without intravenous contrast. Multiplanar CT image reconstructions of the cervical spine were also generated. RADIATION DOSE REDUCTION: This exam was performed according to the departmental dose-optimization program which includes automated exposure control, adjustment of the mA and/or kV according to patient size and/or use of iterative reconstruction technique.  COMPARISON:  None Available. CT head 11/12/2021. FINDINGS: CT HEAD FINDINGS Brain: No acute intracranial hemorrhage. No CT evidence of acute infarct. No edema, mass effect, or midline shift. The basilar cisterns are patent. Ventricles: Prominence of the ventricles suggesting underlying parenchymal volume loss. Vascular: Atherosclerotic calcifications of the carotid siphons. No hyperdense vessel. Skull: No acute or aggressive finding. Orbits: Orbits are symmetric. Sinuses: Mild mucosal thickening in the ethmoid sinuses. Other: Mastoid air cells are clear. CT CERVICAL SPINE FINDINGS Alignment: Straightening of the normal cervical lordosis. No listhesis. No facet subluxation or dislocation. Skull base and vertebrae: No compression fracture or displaced fracture in the cervical spine. No suspicious osseous lesion. Soft tissues and spinal canal: No prevertebral fluid or swelling. No visible canal hematoma. Heterotopic calcification noted within the posterior soft tissues adjacent to the C5 spinous process. Stent noted within the left internal carotid artery. Stent patency cannot be evaluated. Hypoplastic appearance of the thyroid. Disc levels: Moderate disc space narrowing at C5-6 and C6-7. Degenerative endplate osteophytes greatest at C5-6. Disc bulges and disc osteophyte complexes at multiple levels. There is mild spinal canal narrowing at C5-6 and C6-7. Facet arthrosis at multiple levels. Foraminal narrowing at multiple levels greatest at C5-6. Upper chest: Biapical pleuroparenchymal scarring. 3 mm nodule in the left lung apex, likely present on prior chest CT from 2023 and unchanged since that time. Other: None. IMPRESSION: 1. No acute intracranial abnormality. 2. No acute fracture or traumatic malalignment in the cervical spine. 3. Degenerative changes of the cervical spine as above. Electronically Signed   By: Emily Filbert M.D.   On: 11/30/2023 17:08   CT Cervical Spine Wo Contrast Result Date:  11/30/2023 CLINICAL DATA:  Trauma, fall, near syncope. EXAM: CT HEAD WITHOUT CONTRAST CT CERVICAL SPINE WITHOUT CONTRAST TECHNIQUE: Multidetector CT imaging of the head and cervical spine was performed following the standard protocol without intravenous contrast. Multiplanar CT image reconstructions of the cervical spine were also generated. RADIATION DOSE REDUCTION: This exam was performed  according to the departmental dose-optimization program which includes automated exposure control, adjustment of the mA and/or kV according to patient size and/or use of iterative reconstruction technique. COMPARISON:  None Available. CT head 11/12/2021. FINDINGS: CT HEAD FINDINGS Brain: No acute intracranial hemorrhage. No CT evidence of acute infarct. No edema, mass effect, or midline shift. The basilar cisterns are patent. Ventricles: Prominence of the ventricles suggesting underlying parenchymal volume loss. Vascular: Atherosclerotic calcifications of the carotid siphons. No hyperdense vessel. Skull: No acute or aggressive finding. Orbits: Orbits are symmetric. Sinuses: Mild mucosal thickening in the ethmoid sinuses. Other: Mastoid air cells are clear. CT CERVICAL SPINE FINDINGS Alignment: Straightening of the normal cervical lordosis. No listhesis. No facet subluxation or dislocation. Skull base and vertebrae: No compression fracture or displaced fracture in the cervical spine. No suspicious osseous lesion. Soft tissues and spinal canal: No prevertebral fluid or swelling. No visible canal hematoma. Heterotopic calcification noted within the posterior soft tissues adjacent to the C5 spinous process. Stent noted within the left internal carotid artery. Stent patency cannot be evaluated. Hypoplastic appearance of the thyroid. Disc levels: Moderate disc space narrowing at C5-6 and C6-7. Degenerative endplate osteophytes greatest at C5-6. Disc bulges and disc osteophyte complexes at multiple levels. There is mild spinal canal  narrowing at C5-6 and C6-7. Facet arthrosis at multiple levels. Foraminal narrowing at multiple levels greatest at C5-6. Upper chest: Biapical pleuroparenchymal scarring. 3 mm nodule in the left lung apex, likely present on prior chest CT from 2023 and unchanged since that time. Other: None. IMPRESSION: 1. No acute intracranial abnormality. 2. No acute fracture or traumatic malalignment in the cervical spine. 3. Degenerative changes of the cervical spine as above. Electronically Signed   By: Emily Filbert M.D.   On: 11/30/2023 17:08   US Venous Img Lower Bilateral Result Date: 11/30/2023 CLINICAL DATA:  Weakness. EXAM: BILATERAL LOWER EXTREMITY VENOUS DOPPLER ULTRASOUND TECHNIQUE: Gray-scale sonography with graded compression, as well as color Doppler and duplex ultrasound were performed to evaluate the lower extremity deep venous systems from the level of the common femoral vein and including the common femoral, femoral, profunda femoral, popliteal and calf veins including the posterior tibial, peroneal and gastrocnemius veins when visible. The superficial great saphenous vein was also interrogated. Spectral Doppler was utilized to evaluate flow at rest and with distal augmentation maneuvers in the common femoral, femoral and popliteal veins. COMPARISON:  Ultrasound 01/13/2023. FINDINGS: RIGHT LOWER EXTREMITY Common Femoral Vein: No evidence of thrombus. Normal compressibility, respiratory phasicity and response to augmentation. Saphenofemoral Junction: No evidence of thrombus. Normal compressibility and flow on color Doppler imaging. Profunda Femoral Vein: No evidence of thrombus. Normal compressibility and flow on color Doppler imaging. Femoral Vein: No evidence of thrombus. Normal compressibility, respiratory phasicity and response to augmentation. Popliteal Vein: No evidence of thrombus. Normal compressibility, respiratory phasicity and response to augmentation. Calf Veins: No evidence of thrombus. Normal  compressibility and flow on color Doppler imaging. Superficial Great Saphenous Vein: No evidence of thrombus. Normal compressibility. Venous Reflux:  None. Other Findings:  None. LEFT LOWER EXTREMITY Common Femoral Vein: No evidence of thrombus. Normal compressibility, respiratory phasicity and response to augmentation. Saphenofemoral Junction: No evidence of thrombus. Normal compressibility and flow on color Doppler imaging. Profunda Femoral Vein: No evidence of thrombus. Normal compressibility and flow on color Doppler imaging. Femoral Vein: No evidence of thrombus. Normal compressibility, respiratory phasicity and response to augmentation. Popliteal Vein: No evidence of thrombus. Normal compressibility, respiratory phasicity and response to augmentation. Calf Veins: No  evidence of thrombus. Normal compressibility and flow on color Doppler imaging. Superficial Great Saphenous Vein: No evidence of thrombus. Normal compressibility. Venous Reflux:  None. Other Findings:  None. IMPRESSION: No evidence of deep venous thrombosis in either lower extremity. Electronically Signed   By: Karen Kays M.D.   On: 11/30/2023 16:58   DG Chest 2 View Result Date: 11/30/2023 CLINICAL DATA:  Syncope. EXAM: CHEST - 2 VIEW COMPARISON:  X-ray 07/20/2023 FINDINGS: The heart size and mediastinal contours are within normal limits. No consolidation, pneumothorax or effusion. No edema. The visualized skeletal structures are unremarkable. IMPRESSION: No acute cardiopulmonary disease. Electronically Signed   By: Karen Kays M.D.   On: 11/30/2023 16:56    Microbiology: Results for orders placed or performed in visit on 02/25/22  Urine Culture     Status: None   Collection Time: 02/25/22  4:23 PM   Specimen: Urine  Result Value Ref Range Status   MICRO NUMBER: [id number]  Final   SPECIMEN QUALITY: Adequate  Final   Sample Source URINE  Final   STATUS: FINAL  Final   Result:   Final    Mixed genital flora isolated. These  superficial bacteria are not indicative of a urinary tract infection. No further organism identification is warranted on this specimen. If clinically indicated, recollect clean-catch, mid-stream urine and transfer  immediately to Urine Culture Transport Tube.    *Note: Due to a large number of results and/or encounters for the requested time period, some results have not been displayed. A complete set of results can be found in Results Review.    Labs: CBC: Recent Labs  Lab 11/30/23 1406 12/01/23 0452  WBC 11.8* 9.1  HGB 15.2* 13.4  HCT 46.4* 40.5  MCV 83.8 83.2  PLT 239 207   Basic Metabolic Panel: Recent Labs  Lab 11/30/23 1406 12/01/23 0452  NA 142 143  K 3.4* 3.5  CL 103 105  CO2 26 26  GLUCOSE 85 90  BUN 17 16  CREATININE 0.85 0.80  CALCIUM 10.5* 9.5   Liver Function Tests: No results for input(s): "AST", "ALT", "ALKPHOS", "BILITOT", "PROT", "ALBUMIN" in the last 168 hours. CBG: Recent Labs  Lab 11/30/23 2216 12/01/23 0458 12/01/23 0717 12/01/23 0745 12/01/23 1123  GLUCAP 301* 79 60* 81 290*    Discharge time spent: greater than 30 minutes.  Signed: Deanna Artis, DO [facility] 12/01/2023

## 2023-12-01 NOTE — ED Notes (Signed)
 Echo at bedside

## 2023-12-01 NOTE — ED Notes (Signed)
 Daughter in law- Marchelle Folks- phone number for discharge.  Cell: [phone] Work: [phone]

## 2023-12-04 ENCOUNTER — Telehealth: Payer: Self-pay

## 2023-12-04 NOTE — Transitions of Care (Post Inpatient/ED Visit) (Signed)
 12/04/2023  Name: Cassandra Benson MRN: [id number] DOB: [birth year]  Today's TOC FU Call Status: Today's TOC FU Call Status:: Successful TOC FU Call Completed TOC FU Call Complete Date: 12/04/23 Patient's Name and Date of Birth confirmed.  Transition Care Management Follow-up Telephone Call Date of Discharge: 12/01/23 Discharge Facility: [facility]) Type of Discharge: Inpatient Admission Primary Inpatient Discharge Diagnosis:: Syncope How have you been since you were released from the hospital?: Better Any questions or concerns?: No  Items Reviewed: Did you receive and understand the discharge instructions provided?: Yes Medications obtained,verified, and reconciled?: Yes (Medications Reviewed) Any new allergies since your discharge?: No Dietary orders reviewed?: Yes Type of Diet Ordered:: Low sodium Heart Healthy Do you have support at home?: No  Medications Reviewed Today: Medications Reviewed Today     Reviewed by Redge Gainer, RN (Case Manager) on 12/04/23 at 1357  Med List Status: <None>   Medication Order Taking? Sig Documenting Provider Last Dose Status Informant  acetaminophen (TYLENOL) 500 MG tablet [id number] No Take 1,000 mg by mouth daily. Eustaquio Boyden, MD Taking Active Self, Pharmacy Records           Med Note [facility], JOYCE   Thu Nov 30, 2023 10:25 PM) prn  albuterol (VENTOLIN HFA) 108 (90 Base) MCG/ACT inhaler [id number] No Inhale 2 puffs into the lungs every 6 (six) hours as needed. Salena Saner, MD Taking Active Self  Ascorbic Acid (VITAMIN C) 100 MG tablet [id number] No Take 100 mg by mouth daily. [provider] 11/30/2023 Active Self, Pharmacy Records  aspirin EC 81 MG tablet [id number] No Take 1 tablet (81 mg total) by mouth daily. Swallow whole. Eustaquio Boyden, MD Taking Active Self, Pharmacy Records  Blood Glucose Monitoring Suppl DEVI [id number] No 1 each by Does not apply route in the morning, at noon,  and at bedtime. E13.319. May substitute to any manufacturer covered by patient's insurance. Eustaquio Boyden, MD Taking Active Self  Cholecalciferol (VITAMIN D3) 25 MCG (1000 UT) CAPS [id number] No Take 2 capsules (2,000 Units total) by mouth daily. Eustaquio Boyden, MD 11/30/2023 Active Self, Pharmacy Records  clopidogrel (PLAVIX) 75 MG tablet [id number] No TAKE 1 TABLET(75 MG) BY MOUTH EVERY EVENING Cephus Shelling, MD 11/30/2023 Active Self  diclofenac Sodium (VOLTAREN) 1 % GEL [id number] No Apply 2 g topically 3 (three) times daily. Eustaquio Boyden, MD Taking Active Self, Pharmacy Records  Fluticasone-Umeclidin-Vilant [facility] ELLIPTA) 100-62.5-25 MCG/ACT AEPB [id number] No Inhale 1 puff into the lungs daily. Salena Saner, MD Taking Active Self  Glucagon, rDNA, (GLUCAGON EMERGENCY) 1 MG KIT [id number] No INJECT into THE muscle ONCE AS NEEDED FOR emergency Carlus Pavlov, MD Taking Active Self, Pharmacy Records           Med Note Milagros Evener Jun 20, 2023 10:31 AM) Has not needed in a long time  Glucose Blood (BLOOD GLUCOSE TEST STRIPS) STRP [id number] No 1 each by In Vitro route in the morning, at noon, and at bedtime. May substitute to any manufacturer covered by patient's insurance. Eustaquio Boyden, MD Taking Active Self  insulin degludec (TRESIBA FLEXTOUCH) 100 UNIT/ML FlexTouch Pen [id number]  Inject 30 Units into the skin at bedtime. Deanna Artis, DO  Active   insulin lispro (HUMALOG) 100 UNIT/ML injection [id number] No Inject 0.15 mLs (15 Units total) into the skin 3 (three) times daily before meals. Eustaquio Boyden, MD 11/30/2023 Active Self  isosorbide mononitrate (IMDUR) 30 MG 24 hr tablet [id number] No Take 0.5  tablets (15 mg total) by mouth daily. Yvonne Kendall, MD 11/30/2023 Expired 12/02/23 2359 Self, Pharmacy Records  Lancet Device MISC [id number] No 1 each by Does not apply route in the morning, at noon, and at bedtime. May substitute to any manufacturer  covered by patient's insurance. Eustaquio Boyden, MD Taking Active Self  Lancets Misc. MISC [id number] No 1 each by Does not apply route in the morning, at noon, and at bedtime. May substitute to any manufacturer covered by patient's insurance. Eustaquio Boyden, MD Taking Active Self  losartan (COZAAR) 50 MG tablet [id number]  Take 0.5 tablets (25 mg total) by mouth daily. Deanna Artis, DO  Active   Multiple Vitamin (MULTIVITAMIN ADULT) TABS [id number] No Take 1 tablet by mouth in the morning and at bedtime. Eustaquio Boyden, MD 11/30/2023 Active Self, Pharmacy Records  nitroGLYCERIN (NITROSTAT) 0.4 MG SL tablet [id number] No Place 1 tablet (0.4 mg total) under the tongue every 5 (five) minutes as needed for chest pain. Yvonne Kendall, MD Taking Expired 12/02/23 2359 Self, Pharmacy Records  rosuvastatin (CRESTOR) 20 MG tablet [id number] No Take 1 tablet (20 mg total) by mouth every evening. Eustaquio Boyden, MD 11/29/2023 Active Self  SYNTHROID 75 MCG tablet [id number] No TAKE ONE TABLET BY MOUTH Monday-Saturday BEFORE breakfast Eustaquio Boyden, MD 11/30/2023 Active Self  topiramate (TOPAMAX) 50 MG tablet [id number] No Take 1 tablet (50 mg total) by mouth daily. Eustaquio Boyden, MD 11/30/2023 Active Self  torsemide (DEMADEX) 20 MG tablet [id number]  Take 1 tablet (20 mg total) by mouth daily. Deanna Artis, DO  Active             Home Care and Equipment/Supplies: Were Home Health Services Ordered?: NA Any new equipment or medical supplies ordered?: NA  Functional Questionnaire: Do you need assistance with bathing/showering or dressing?: No Do you need assistance with meal preparation?: No Do you need assistance with eating?: No Do you have difficulty maintaining continence: No Do you need assistance with getting out of bed/getting out of a chair/moving?: No Do you have difficulty managing or taking your medications?: No  Follow up appointments reviewed: PCP Follow-up appointment  confirmed?: Yes Date of PCP follow-up appointment?: 12/13/23 Follow-up Provider: Dr. Sharen Hones Specialist [facility] Follow-up appointment confirmed?: NA Do you need transportation to your follow-up appointment?: No Do you understand care options if your condition(s) worsen?: Yes-patient verbalized understanding  SDOH Interventions Today    Flowsheet Row Most Recent Value  SDOH Interventions   Food Insecurity Interventions Intervention Not Indicated  Housing Interventions Intervention Not Indicated  Transportation Interventions Intervention Not Indicated  Utilities Interventions Intervention Not Indicated      Interventions Today    Flowsheet Row Most Recent Value  Chronic Disease   Chronic disease during today's visit Diabetes  General Interventions   General Interventions Discussed/Reviewed General Interventions Discussed, General Interventions Reviewed, Doctor Visits  Doctor Visits Discussed/Reviewed Doctor Visits Reviewed  Exercise Interventions   Exercise Discussed/Reviewed Exercise Discussed  Education Interventions   Education Provided Provided Education  Provided Verbal Education On Blood Sugar Monitoring, Exercise, Medication, When to see the doctor, Insurance Plans  Nutrition Interventions   Nutrition Discussed/Reviewed Nutrition Discussed, Nutrition Reviewed  Pharmacy Interventions   Pharmacy Dicussed/Reviewed Medications and their functions       Deidre Ala, BSN, RN [place]  VBCI - Population Health RN Care Manager [phone]

## 2023-12-11 ENCOUNTER — Telehealth: Payer: Self-pay

## 2023-12-11 DIAGNOSIS — E162 Hypoglycemia, unspecified: Secondary | ICD-10-CM | POA: Diagnosis not present

## 2023-12-11 DIAGNOSIS — E13319 Other specified diabetes mellitus with unspecified diabetic retinopathy without macular edema: Secondary | ICD-10-CM | POA: Diagnosis not present

## 2023-12-11 LAB — HEMOGLOBIN A1C: Hemoglobin A1C: 12.2

## 2023-12-11 NOTE — Telephone Encounter (Addendum)
 Spoke with Dr Dell Ponto. Concern over progressive cognitive difficulty, confusion over insulin administration.  Simplified regimen from endo OV today:  1) Decrease your Tresiba to 55 units once a day. This is your long-acting insulin. 2) Please try to use Lispro 5 units before every meal. 3) Please record your blood sugar levels before breakfast, lunch, dinner, and bedtime for 1 week. In addition, please write down exactly how much insulin you gave yourself before each meal for 1 week. 4) Please try to get your Dexcom   Upcoming appt on Wednesday - will likely touch base with daughter and son at that time.

## 2023-12-11 NOTE — Telephone Encounter (Signed)
 Copied from CRM [id number]. Topic: Clinical - Medical Advice >> Dec 11, 2023  3:35 PM Cassandra Benson wrote: Reason for CRM:  Beryle Flock MD, a physician with [facility] is requesting a phone call back from Dr.Gutierrez as Dr. Dell Ponto saw the patient in clinic today and is worried about the patients cognitive function. Please call Dr. Dell Ponto at [phone]

## 2023-12-11 NOTE — Telephone Encounter (Signed)
 Returned call.  Left message

## 2023-12-13 ENCOUNTER — Encounter: Payer: Self-pay | Admitting: Family Medicine

## 2023-12-13 ENCOUNTER — Ambulatory Visit: Admitting: Family Medicine

## 2023-12-13 VITALS — BP 134/70 | HR 100 | Temp 97.8°F | Ht 62.75 in | Wt 149.5 lb

## 2023-12-13 DIAGNOSIS — G3184 Mild cognitive impairment, so stated: Secondary | ICD-10-CM | POA: Diagnosis not present

## 2023-12-13 DIAGNOSIS — I5032 Chronic diastolic (congestive) heart failure: Secondary | ICD-10-CM

## 2023-12-13 DIAGNOSIS — R55 Syncope and collapse: Secondary | ICD-10-CM

## 2023-12-13 DIAGNOSIS — E13319 Other specified diabetes mellitus with unspecified diabetic retinopathy without macular edema: Secondary | ICD-10-CM

## 2023-12-13 NOTE — Progress Notes (Unsigned)
 Ph: [phone] Fax: [phone]   Patient ID: Cassandra Benson, female    DOB: 12/17/1940, 83 y.o.   MRN: [id number]  This visit was conducted in person.  BP 134/70   Pulse 100   Temp 97.8 F (36.6 C) (Oral)   Ht 5' 2.75" (1.594 m)   Wt 149 lb 8 oz (67.8 kg)   SpO2 95%   BMI 26.69 kg/m    CC: hosp f/u visit  Subjective:   HPI: Cassandra Benson is a 83 y.o. female presenting on 12/13/2023 for Hospitalization Follow-up (Admitted on 11/30/23 at [facility], dx near syncope. Pt accompanied by son, Susy Frizzle and d-i-l, Marchelle Folks.)   Recent concern over progressive cognitive difficulty, confusion over insulin administration, concern shared by endocrinologist Dr Dell Ponto.  Simplified regimen from endo OV 12/11/2023:  1) Decrease your Tresiba to 55 units once a day. This is your long-acting insulin. 2) Please try to use Lispro 5 units before every meal. 3) Please record your blood sugar levels before breakfast, lunch, dinner, and bedtime for 1 week. In addition, please write down exactly how much insulin you gave yourself before each meal for 1 week. 4) Please try to get your Dexcom   Recent hospitalization for syncope thought due to orthostasis vs hypoglycemia.  Hospital records reviewed. Med rec performed.  Upon discharge was recommended holding beta blocker and dropping losartan dose to 25mg , as well as dropping nightly Tresiba insulin to 30u.  Imdur 15mg  was continued.  She did take SL nitroglycerin prior   She sat quickly onto sofa and may have hit lower back with residual discomfort.   Home health not set up.  Other follow up appointments scheduled: 12/20/2023 with me, 01/08/2024 with Dr Dell Ponto endocrinology, 01/20/2024 with Dr Jayme Cloud pulmonology, 05/13/2024 with Clarisa Kindred NP CHF ______________________________________________________________________ Hospital admission: 11/30/2023 Hospital discharge: 12/01/2023 TCM f/u phone call:  performed on 12/04/2023  Recommendations at  discharge:  At this time patient will be discharged home.  If you experience any symptoms such as fever, vomiting, shortness of breath, chest pain, abdominal pain, or other concerning symptoms, please call your primary care provider or go to the emergency department immediately.   Discharge Diagnoses: Principal Problem:   Syncope Active Problems:   Essential hypertension   Hypothyroidism due to Hashimoto's thyroiditis   OSA (obstructive sleep apnea)   Hyperlipidemia associated with type 2 diabetes mellitus (HCC)   TIA (transient ischemic attack)   PAD (peripheral artery disease) (HCC)   Sjogren's syndrome without extraglandular involvement (HCC)   Unsteadiness   Insomnia     Relevant past medical, surgical, family and social history reviewed and updated as indicated. Interim medical history since our last visit reviewed. Allergies and medications reviewed and updated. Outpatient Medications Prior to Visit  Medication Sig Dispense Refill   acetaminophen (TYLENOL) 500 MG tablet Take 1,000 mg by mouth daily.     albuterol (VENTOLIN HFA) 108 (90 Base) MCG/ACT inhaler Inhale 2 puffs into the lungs every 6 (six) hours as needed. 8 g 2   Ascorbic Acid (VITAMIN C) 100 MG tablet Take 100 mg by mouth daily.     aspirin EC 81 MG tablet Take 1 tablet (81 mg total) by mouth daily. Swallow whole. 30 tablet 12   Blood Glucose Monitoring Suppl DEVI 1 each by Does not apply route in the morning, at noon, and at bedtime. E13.319. May substitute to any manufacturer covered by patient's insurance. 1 each 0   Cholecalciferol (VITAMIN D3) 25 MCG (1000 UT)  CAPS Take 2 capsules (2,000 Units total) by mouth daily.     clopidogrel (PLAVIX) 75 MG tablet TAKE 1 TABLET(75 MG) BY MOUTH EVERY EVENING 30 tablet 11   diclofenac Sodium (VOLTAREN) 1 % GEL Apply 2 g topically 3 (three) times daily. 100 g 1   Fluticasone-Umeclidin-Vilant (TRELEGY ELLIPTA) 100-62.5-25 MCG/ACT AEPB Inhale 1 puff into the lungs daily. 28 each  11   Glucagon, rDNA, (GLUCAGON EMERGENCY) 1 MG KIT INJECT into THE muscle ONCE AS NEEDED FOR emergency 1 kit 12   insulin degludec (TRESIBA FLEXTOUCH) 100 UNIT/ML FlexTouch Pen Inject 55 Units into the skin at bedtime.     insulin lispro (HUMALOG) 100 UNIT/ML injection Inject 0.05 mLs (5 Units total) into the skin 3 (three) times daily before meals.     losartan (COZAAR) 50 MG tablet Take 0.5 tablets (25 mg total) by mouth daily.     Multiple Vitamin (MULTIVITAMIN ADULT) TABS Take 1 tablet by mouth in the morning and at bedtime.     rosuvastatin (CRESTOR) 20 MG tablet Take 1 tablet (20 mg total) by mouth every evening. 90 tablet 4   SYNTHROID 75 MCG tablet TAKE ONE TABLET BY MOUTH Monday-Saturday BEFORE breakfast 80 tablet 4   topiramate (TOPAMAX) 50 MG tablet Take 1 tablet (50 mg total) by mouth daily. 90 tablet 3   torsemide (DEMADEX) 20 MG tablet Take 1 tablet (20 mg total) by mouth daily. 60 tablet 5   isosorbide mononitrate (IMDUR) 30 MG 24 hr tablet Take 0.5 tablets (15 mg total) by mouth daily. 30 tablet 6   nitroGLYCERIN (NITROSTAT) 0.4 MG SL tablet Place 1 tablet (0.4 mg total) under the tongue every 5 (five) minutes as needed for chest pain. 25 tablet PRN   insulin degludec (TRESIBA FLEXTOUCH) 100 UNIT/ML FlexTouch Pen Inject 30 Units into the skin at bedtime.     insulin lispro (HUMALOG) 100 UNIT/ML injection Inject 0.15 mLs (15 Units total) into the skin 3 (three) times daily before meals.     No facility-administered medications prior to visit.     Per HPI unless specifically indicated in ROS section below Review of Systems  Objective:  BP 134/70   Pulse 100   Temp 97.8 F (36.6 C) (Oral)   Ht 5' 2.75" (1.594 m)   Wt 149 lb 8 oz (67.8 kg)   SpO2 95%   BMI 26.69 kg/m   Wt Readings from Last 3 Encounters:  12/13/23 149 lb 8 oz (67.8 kg)  11/30/23 149 lb (67.6 kg)  11/14/23 149 lb (67.6 kg)      Physical Exam Vitals and nursing note reviewed.  Constitutional:       Appearance: Normal appearance.  Neurological:     Mental Status: She is alert.  Psychiatric:        Mood and Affect: Mood normal.        Behavior: Behavior normal.       Results for orders placed or performed during the hospital encounter of 11/30/23  Basic metabolic panel   Collection Time: 11/30/23  2:06 PM  Result Value Ref Range   Sodium 142 135 - 145 mmol/L   Potassium 3.4 (L) 3.5 - 5.1 mmol/L   Chloride 103 98 - 111 mmol/L   CO2 26 22 - 32 mmol/L   Glucose, Bld 85 70 - 99 mg/dL   BUN 17 8 - 23 mg/dL   Creatinine, Ser 5.62 0.44 - 1.00 mg/dL   Calcium 13.0 (H) 8.9 - 10.3 mg/dL  GFR, Estimated >60 >60 mL/min   Anion gap 13 5 - 15  CBC   Collection Time: 11/30/23  2:06 PM  Result Value Ref Range   WBC 11.8 (H) 4.0 - 10.5 K/uL   RBC 5.54 (H) 3.87 - 5.11 MIL/uL   Hemoglobin 15.2 (H) 12.0 - 15.0 g/dL   HCT 16.1 (H) 09.6 - 04.5 %   MCV 83.8 80.0 - 100.0 fL   MCH 27.4 26.0 - 34.0 pg   MCHC 32.8 30.0 - 36.0 g/dL   RDW 40.9 81.1 - 91.4 %   Platelets 239 150 - 400 K/uL   nRBC 0.0 0.0 - 0.2 %  D-dimer, quantitative   Collection Time: 11/30/23  2:06 PM  Result Value Ref Range   D-Dimer, Quant 0.93 (H) 0.00 - 0.50 ug/mL-FEU  Troponin I (High Sensitivity)   Collection Time: 11/30/23  2:06 PM  Result Value Ref Range   Troponin I (High Sensitivity) 5 <18 ng/L  CBG monitoring, ED   Collection Time: 11/30/23  2:09 PM  Result Value Ref Range   Glucose-Capillary 75 70 - 99 mg/dL  Troponin I (High Sensitivity)   Collection Time: 11/30/23  4:50 PM  Result Value Ref Range   Troponin I (High Sensitivity) 4 <18 ng/L  CBG monitoring, ED   Collection Time: 11/30/23  5:41 PM  Result Value Ref Range   Glucose-Capillary 69 (L) 70 - 99 mg/dL  CBG monitoring, ED   Collection Time: 11/30/23 10:16 PM  Result Value Ref Range   Glucose-Capillary 301 (H) 70 - 99 mg/dL  Basic metabolic panel   Collection Time: 12/01/23  4:52 AM  Result Value Ref Range   Sodium 143 135 - 145 mmol/L    Potassium 3.5 3.5 - 5.1 mmol/L   Chloride 105 98 - 111 mmol/L   CO2 26 22 - 32 mmol/L   Glucose, Bld 90 70 - 99 mg/dL   BUN 16 8 - 23 mg/dL   Creatinine, Ser 7.82 0.44 - 1.00 mg/dL   Calcium 9.5 8.9 - 95.6 mg/dL   GFR, Estimated >21 >30 mL/min   Anion gap 12 5 - 15  CBC   Collection Time: 12/01/23  4:52 AM  Result Value Ref Range   WBC 9.1 4.0 - 10.5 K/uL   RBC 4.87 3.87 - 5.11 MIL/uL   Hemoglobin 13.4 12.0 - 15.0 g/dL   HCT 86.5 78.4 - 69.6 %   MCV 83.2 80.0 - 100.0 fL   MCH 27.5 26.0 - 34.0 pg   MCHC 33.1 30.0 - 36.0 g/dL   RDW 29.5 28.4 - 13.2 %   Platelets 207 150 - 400 K/uL   nRBC 0.0 0.0 - 0.2 %  CBG monitoring, ED   Collection Time: 12/01/23  4:58 AM  Result Value Ref Range   Glucose-Capillary 79 70 - 99 mg/dL  CBG monitoring, ED   Collection Time: 12/01/23  7:17 AM  Result Value Ref Range   Glucose-Capillary 60 (L) 70 - 99 mg/dL  CBG monitoring, ED   Collection Time: 12/01/23  7:45 AM  Result Value Ref Range   Glucose-Capillary 81 70 - 99 mg/dL  ECHOCARDIOGRAM COMPLETE   Collection Time: 12/01/23 10:44 AM  Result Value Ref Range   Weight 2,384 oz   BP 116/43 mmHg   Ao pk vel 1.50 m/s   AV Area VTI 2.14 cm2   AR max vel 2.14 cm2   AV Mean grad 5.0 mmHg   AV Peak grad 9.0 mmHg  Single Plane A2C EF 62.9 %   Single Plane A4C EF 60.8 %   Calc EF 61.9 %   S' Lateral 2.60 cm   AV Area mean vel 1.94 cm2   Area-P 1/2 2.99 cm2   MV VTI 1.90 cm2   Est EF 60 - 65%   CBG monitoring, ED   Collection Time: 12/01/23 11:23 AM  Result Value Ref Range   Glucose-Capillary 290 (H) 70 - 99 mg/dL   *Note: Due to a large number of results and/or encounters for the requested time period, some results have not been displayed. A complete set of results can be found in Results Review.    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   No orders of the defined types were placed in this encounter.   Patient Instructions  Continue medicines as per medicine list today  Follow new insulin regimen: Tresiba 55 units nightly  Lispro 5 units before meals.  Record blood sugar levels before breakfast, lunch, dinner and bedtime for 1 week as well as how much insulin you give yourself and drop off to review.   I would like you to return to see Dr Malvin Johns - referral placed.   Follow up plan: No follow-ups on file.  Eustaquio Boyden, MD

## 2023-12-13 NOTE — Patient Instructions (Addendum)
 Continue medicines as per medicine list today  Follow new insulin regimen: Tresiba 55 units nightly  Lispro 5 units before meals.  Record blood sugar levels before breakfast, lunch, dinner and bedtime for 1 week as well as how much insulin you give yourself and drop off to review.   I would like you to return to see Dr Malvin Johns - referral placed.

## 2023-12-14 ENCOUNTER — Encounter: Payer: Self-pay | Admitting: Family Medicine

## 2023-12-14 NOTE — Assessment & Plan Note (Addendum)
 Longstanding insulin-dependent diabetic, now with concerns by myself and endo about ability to consistently administer insulin correctly /safely. Pt does note she feels very overwhelmed with her diabetes management Son Susy Frizzle and DIL Marchelle Folks (who is RN) agree to actively manage insulin dosing at least over the next 10 days until next visit, following below simplified regimen from endo Evaristo Bury 55u nightly, Lispro 5u with meals) - Cassandra Benson agrees to this as well.  I have also printed out weekly sugar log for them to record sugar readings as well as insulin dosing.  She and family are also in agreement with trying to get her established locally with endocrinologist in [place] to facilitate transportation. Will place referral to [facility] endocrinology.  Keep close f/u 12/20/2023.

## 2023-12-14 NOTE — Assessment & Plan Note (Signed)
 Last saw CHF clinic Clarisa Kindred NP) 10/2023, note reviewed - overall stable period from HF standpoint

## 2023-12-14 NOTE — Assessment & Plan Note (Addendum)
 Acute worsening in setting of worse diabetes control, ?due to hypo/hyperglcyemia vs progression of MCI/dementia.  Discussed medication management - children agree to more active involvement in her medication management at least over next 1-2 wks especially insulin regimen as per above.  She declines ALF as she desires to maintain independence. There has also been discussion of pt moving to [place] to live with her other daughter [name].  Pending repeat neuropsychological evaluation (recently referred to Dr Kieth Brightly last month).  Will refer back to [facility] neurology - last saw Dr Malvin Johns

## 2023-12-14 NOTE — Assessment & Plan Note (Addendum)
 Recurrent episodes most concerning for hypoglycemia induced given concern over insulin administration.  Discussed with patient as well as son Susy Frizzle and DIL Marchelle Folks.  See plan below.  BP stable on current regimen - continue this.

## 2023-12-20 ENCOUNTER — Ambulatory Visit (INDEPENDENT_AMBULATORY_CARE_PROVIDER_SITE_OTHER): Payer: PPO | Admitting: Family Medicine

## 2023-12-20 ENCOUNTER — Encounter: Payer: Self-pay | Admitting: Family Medicine

## 2023-12-20 VITALS — BP 128/84 | HR 112 | Temp 97.8°F | Ht 62.75 in | Wt 147.0 lb

## 2023-12-20 DIAGNOSIS — G3184 Mild cognitive impairment, so stated: Secondary | ICD-10-CM

## 2023-12-20 DIAGNOSIS — E13319 Other specified diabetes mellitus with unspecified diabetic retinopathy without macular edema: Secondary | ICD-10-CM | POA: Diagnosis not present

## 2023-12-20 MED ORDER — DONEPEZIL HCL 5 MG PO TABS: 5.0000 mg | ORAL_TABLET | Freq: Every day | ORAL | 3 refills | Status: DC

## 2023-12-20 NOTE — Patient Instructions (Addendum)
 Continue insulin: Cassandra Benson 55u take only at night  Lispro 5u before every meal   Start donepezil (Aricept) 5mg  nightly for memory. Stop if you have worsening indigestion/acid reflux or palpitations. We have room to increase dose if tolerated well.   Return in 2 weeks in April with [name].

## 2023-12-20 NOTE — Progress Notes (Signed)
 Ph: [phone] Fax: [phone]   Patient ID: Cassandra Benson, female    DOB: September 01, 1941, 83 y.o.   MRN: [id number]  This visit was conducted in person.  BP 128/84   Pulse (!) 112   Temp 97.8 F (36.6 C) (Oral)   Ht 5' 2.75" (1.594 m)   Wt 147 lb (66.7 kg)   SpO2 99%   BMI 26.25 kg/m    CC: 6 wk f/u visit  Subjective:   HPI: Cassandra Benson is a 83 y.o. female presenting on 12/20/2023 for Medical Management of Chronic Issues (Here for 6 wk DM f/u. Pt accompanied by d-i-l, Marchelle Folks. )   See prior note for details.  Current diabetic regimen: Tresiba 55u nightly at 10pm (pens) Lispro 5u before meals (vial/syringe)  They bring partially completed sugar log which was reviewed. Her sugars run well controlled for the first 3 days when they were more consistent with insulin administration and sugar recording: fasting 87-112, pre-meal insulin 100-150s, PM 180-190. However after first 3 days they stop consistently recording cbgs/insulin and sugar control worsens. One day she's taken long acting basal insulin Tresiba 55u at 3pm and another day at 9:30am. Sugars are higher 200-300 on days where she hasn't documented any [place] administration.   Today she is upset with her daughter in [employer]. Last visit she agreed to have children help manage her insulin however today notes she didn't like being closely monitored as she feels she can manage her own insulin.   Last visit I placed new referrals to [facility] neurology and [facility] endocrinology - neuro appt scheduled for 01/10/2024, endo appt still pending. Endo referral placed to [facility] as pt stated she wanted to coordinate care closer to home.   Referred to neuropsychology for neurocognitive evaluation - pending appt with Dr Kieth Brightly PsyD at [facility] PM&R department.   She is strongly considering move to [place] to live with her daughter [name].      Relevant past medical, surgical, family and social history  reviewed and updated as indicated. Interim medical history since our last visit reviewed. Allergies and medications reviewed and updated. Outpatient Medications Prior to Visit  Medication Sig Dispense Refill   acetaminophen (TYLENOL) 500 MG tablet Take 1,000 mg by mouth daily.     albuterol (VENTOLIN HFA) 108 (90 Base) MCG/ACT inhaler Inhale 2 puffs into the lungs every 6 (six) hours as needed. 8 g 2   Ascorbic Acid (VITAMIN C) 100 MG tablet Take 100 mg by mouth daily.     aspirin EC 81 MG tablet Take 1 tablet (81 mg total) by mouth daily. Swallow whole. 30 tablet 12   Blood Glucose Monitoring Suppl DEVI 1 each by Does not apply route in the morning, at noon, and at bedtime. E13.319. May substitute to any manufacturer covered by patient's insurance. 1 each 0   Cholecalciferol (VITAMIN D3) 25 MCG (1000 UT) CAPS Take 2 capsules (2,000 Units total) by mouth daily.     clopidogrel (PLAVIX) 75 MG tablet TAKE 1 TABLET(75 MG) BY MOUTH EVERY EVENING 30 tablet 11   diclofenac Sodium (VOLTAREN) 1 % GEL Apply 2 g topically 3 (three) times daily. 100 g 1   Fluticasone-Umeclidin-Vilant (TRELEGY ELLIPTA) 100-62.5-25 MCG/ACT AEPB Inhale 1 puff into the lungs daily. 28 each 11   Glucagon, rDNA, (GLUCAGON EMERGENCY) 1 MG KIT INJECT into THE muscle ONCE AS NEEDED FOR emergency 1 kit 12   insulin degludec (TRESIBA FLEXTOUCH) 100 UNIT/ML FlexTouch Pen Inject 55 Units  into the skin at bedtime.     insulin lispro (HUMALOG) 100 UNIT/ML injection Inject 0.05 mLs (5 Units total) into the skin 3 (three) times daily before meals.     losartan (COZAAR) 50 MG tablet Take 0.5 tablets (25 mg total) by mouth daily.     Multiple Vitamin (MULTIVITAMIN ADULT) TABS Take 1 tablet by mouth in the morning and at bedtime.     rosuvastatin (CRESTOR) 20 MG tablet Take 1 tablet (20 mg total) by mouth every evening. 90 tablet 4   SYNTHROID 75 MCG tablet TAKE ONE TABLET BY MOUTH Monday-Saturday BEFORE breakfast 80 tablet 4   topiramate  (TOPAMAX) 50 MG tablet Take 1 tablet (50 mg total) by mouth daily. 90 tablet 3   torsemide (DEMADEX) 20 MG tablet Take 1 tablet (20 mg total) by mouth daily. 60 tablet 5   isosorbide mononitrate (IMDUR) 30 MG 24 hr tablet Take 0.5 tablets (15 mg total) by mouth daily. 30 tablet 6   nitroGLYCERIN (NITROSTAT) 0.4 MG SL tablet Place 1 tablet (0.4 mg total) under the tongue every 5 (five) minutes as needed for chest pain. 25 tablet PRN   No facility-administered medications prior to visit.     Per HPI unless specifically indicated in ROS section below Review of Systems  Objective:  BP 128/84   Pulse (!) 112   Temp 97.8 F (36.6 C) (Oral)   Ht 5' 2.75" (1.594 m)   Wt 147 lb (66.7 kg)   SpO2 99%   BMI 26.25 kg/m   Wt Readings from Last 3 Encounters:  12/20/23 147 lb (66.7 kg)  12/13/23 149 lb 8 oz (67.8 kg)  11/30/23 149 lb (67.6 kg)      Physical Exam Vitals and nursing note reviewed.  Constitutional:      Appearance: Normal appearance.  Neurological:     Mental Status: She is alert.  Psychiatric:        Speech: Speech normal.        Behavior: Behavior normal. Behavior is cooperative.        Cognition and Memory: She exhibits impaired recent memory.     Comments: Perseveration       Results for orders placed or performed in visit on 12/13/23  Hemoglobin A1c   Collection Time: 12/11/23 12:00 AM  Result Value Ref Range   Hemoglobin A1C 12.2    *Note: Due to a large number of results and/or encounters for the requested time period, some results have not been displayed. A complete set of results can be found in Results Review.   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 Cbg's well controlled on days where she took insulin regimen correctly.  Predominant concern is  cognitive impairment limiting ability to safely manage insulin on her own.  She only allowed daughter in law Marchelle Folks RN to help with her insulin for ~3 days.  Rec continue simplified insulin regimen.  See below.       MCI (mild cognitive impairment) with memory loss   Predominant concern is progressive MCI leading to difficulty with insulin management, confirmed with recent sugar log recordings. Again discussed this with patient and DIL Marchelle Folks.  Patient became upset with DIL's assistance over this past week. She is now considering moving to [place] with her daughter [name] who will be visiting locally next week - I asked her to return with daughter [name] to see me next week for further discussion.  In interim, did recommend start donepezil (Aricept) 5mg  nightly with option to titrate if tolerated. Reviewed side effects to watch for - cardiac conduction abnormalities, GI upset, worsened reflux.  RTC 2-3 wks close f/u.   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         Meds ordered this encounter  Medications   donepezil (ARICEPT) 5 MG tablet    Sig: Take 1 tablet (5 mg total) by mouth at bedtime.    Dispense:  30 tablet    Refill:  3    No orders of the defined types were placed in this encounter.   Patient Instructions   Continue insulin: Evaristo Bury 55u take only at night  Lispro 5u before every meal   Start donepezil (Aricept) 5mg  nightly for memory. Stop if you have worsening indigestion/acid reflux or palpitations. We have room to increase dose if tolerated well.   Return in 2 weeks in April with [name].    Follow up plan: Return in about 2 weeks (around 01/03/2024) for follow up visit.  Eustaquio Boyden, MD

## 2023-12-23 ENCOUNTER — Encounter: Payer: Self-pay | Admitting: Family Medicine

## 2023-12-23 NOTE — Assessment & Plan Note (Addendum)
 Predominant concern is progressive MCI leading to difficulty with insulin management, confirmed with recent sugar log recordings. Again discussed this with patient and DIL Marchelle Folks.  Patient became upset with DIL's assistance over this past week. She is now considering moving to [place] with her daughter [name] who will be visiting locally next week - I asked her to return with daughter [name] to see me next week for further discussion.  In interim, did recommend start donepezil (Aricept) 5mg  nightly with option to titrate if tolerated. Reviewed side effects to watch for - cardiac conduction abnormalities, GI upset, worsened reflux.  RTC 2-3 wks close f/u.   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

## 2023-12-23 NOTE — Addendum Note (Signed)
 Addended by: Eustaquio Boyden on: 12/23/2023 10:07 AM   Modules accepted: Level of Service

## 2023-12-23 NOTE — Assessment & Plan Note (Signed)
 Longstanding insulin dependent diabetic. Cbg's well controlled on days where she took insulin regimen correctly.  Predominant concern is cognitive impairment limiting ability to safely manage insulin on her own.  She only allowed daughter in law Marchelle Folks RN to help with her insulin for ~3 days.  Rec continue simplified insulin regimen.  See below.

## 2023-12-26 ENCOUNTER — Other Ambulatory Visit: Payer: Self-pay | Admitting: Family Medicine

## 2023-12-26 DIAGNOSIS — Z1231 Encounter for screening mammogram for malignant neoplasm of breast: Secondary | ICD-10-CM

## 2023-12-27 ENCOUNTER — Other Ambulatory Visit: Payer: Self-pay | Admitting: Family Medicine

## 2023-12-31 ENCOUNTER — Other Ambulatory Visit: Payer: Self-pay | Admitting: Family Medicine

## 2024-01-01 ENCOUNTER — Telehealth: Payer: Self-pay | Admitting: Family Medicine

## 2024-01-01 NOTE — Telephone Encounter (Signed)
 Copied from CRM [id number]. Topic: General - Other >> Jan 01, 2024 11:36 AM Emylou G wrote: Reason for CRM: Patient adv someone is going to contact him to ask for permission to approve her medication.. she couldn't remember the name.Marland Kitchen

## 2024-01-01 NOTE — Telephone Encounter (Signed)
 ERx

## 2024-01-02 NOTE — Telephone Encounter (Signed)
 Spoke with pt asking for more info concerning her message. Pt states she was talking about donepezil but she has already picked it up and has been taking almost a wk now. Nothing else needed at this time.

## 2024-01-04 ENCOUNTER — Encounter: Payer: Self-pay | Admitting: Psychology

## 2024-01-05 ENCOUNTER — Telehealth: Payer: Self-pay | Admitting: Family Medicine

## 2024-01-05 ENCOUNTER — Ambulatory Visit (INDEPENDENT_AMBULATORY_CARE_PROVIDER_SITE_OTHER): Admitting: Family Medicine

## 2024-01-05 ENCOUNTER — Encounter: Payer: Self-pay | Admitting: Family Medicine

## 2024-01-05 VITALS — BP 120/62 | HR 96 | Temp 97.6°F | Ht 62.75 in | Wt 148.0 lb

## 2024-01-05 DIAGNOSIS — R519 Headache, unspecified: Secondary | ICD-10-CM | POA: Diagnosis not present

## 2024-01-05 DIAGNOSIS — E13319 Other specified diabetes mellitus with unspecified diabetic retinopathy without macular edema: Secondary | ICD-10-CM

## 2024-01-05 DIAGNOSIS — G3184 Mild cognitive impairment, so stated: Secondary | ICD-10-CM

## 2024-01-05 DIAGNOSIS — I10 Essential (primary) hypertension: Secondary | ICD-10-CM | POA: Diagnosis not present

## 2024-01-05 DIAGNOSIS — G8929 Other chronic pain: Secondary | ICD-10-CM | POA: Diagnosis not present

## 2024-01-05 MED ORDER — TOPIRAMATE 25 MG PO TABS: 25.0000 mg | ORAL_TABLET | Freq: Every day | ORAL | 6 refills | Status: DC

## 2024-01-05 MED ORDER — METOPROLOL SUCCINATE ER 25 MG PO TB24
12.5000 mg | ORAL_TABLET | Freq: Every day | ORAL | 3 refills | Status: DC
Start: 2024-01-05 — End: 2024-04-17

## 2024-01-05 MED ORDER — TRESIBA FLEXTOUCH 100 UNIT/ML ~~LOC~~ SOPN: 55.0000 [IU] | PEN_INJECTOR | Freq: Every day | SUBCUTANEOUS | 3 refills | Status: DC

## 2024-01-05 MED ORDER — METOPROLOL SUCCINATE ER 25 MG PO TB24: 25.0000 mg | ORAL_TABLET | Freq: Every day | ORAL | 3 refills | Status: DC

## 2024-01-05 MED ORDER — ISOSORBIDE MONONITRATE ER 30 MG PO TB24: 15.0000 mg | ORAL_TABLET | Freq: Every day | ORAL | 6 refills | Status: DC

## 2024-01-05 NOTE — Telephone Encounter (Signed)
 Dr Reece Agar, have you seen this faxed order from [employer]?

## 2024-01-05 NOTE — Patient Instructions (Addendum)
 Look at new medicine list provided today.  Restart isosorbide mononitrate 30mg  1/2 tablet daily - this should help decrease need for nitroglycerin under the tongue.  Restart Toprol XL 25mg  1/2 tablet daily - sent to pharmacy Restart topiramate 25mg  nightly.  I have refilled Cassandra Benson to take 55 units nightly - every night regardless of meals.   New sugar log sheet provided today.  I will send prescription for continuous glucose monitor to [employer].  Insulin pump will need to come from diabetes doctor (endocrinologist).   Insulin regimen: Tresiba 55u nightly at 10pm (pens) Lispro 5u before meals (pens)

## 2024-01-05 NOTE — Progress Notes (Signed)
 Ph: [phone] Fax: [phone]   Patient ID: Cassandra Benson, female    DOB: 03/15/1941, 83 y.o.   MRN: [id number]  This visit was conducted in person.  BP 120/62   Pulse 96   Temp 97.6 F (36.4 C) (Oral)   Ht 5' 2.75" (1.594 m)   Wt 148 lb (67.1 kg)   SpO2 94%   BMI 26.43 kg/m    CC:  f/u visit  Subjective:   HPI: Cassandra Benson is a 83 y.o. female presenting on 01/05/2024 for Medical Management of Chronic Issues (Here for 2 wk memory f/u. Pt brought in home BP monitor to compare. Reading in office today- 138/ 68. Pt accompanied by daughter, [name]. )   See prior notes for details.  Current diabetic regimen: Tresiba 55u nightly at 10pm (pens) Lispro 5u before meals (pens) (Simplified regimen to facilitate adherence)  Concern for memory impairment affecting ability to safely administer her insulin.  She declined to have her DIL Marchelle Folks RN help manage her insulin.  Here today with daughter [name] who lives in [place] - she was considering move to [place] to live with daughter - today states she has decided not to move to [place].   She states she's intermittently been checking sugars but did not bring log.  She brings BP log - 114-132/60-70 in am.   She notes ongoing R sided frontal headache, it is bothersome. No nausea/vomiting. Notes some photo/phonophobia. She stopped diet beverages.   Last visit we started donepezil 5mg  nightly. Some nausea 2 nights ago but otherwise seems to be tolerating well.   I received request from [employer] for Edwards insulin pump and Edwards CGM - will fill out CGM request, but insulin pump will need to come from endocrinology.   Has been referred to [facility] endo (closer to home) and neuro - has appt with Dr Malvin Johns 01/10/2024 PM. Has upcoming appt with pulmonology 4/16 AM.  [facility]t still pending.  She has [facility] endo appt 01/08/2024 scheduled (virtual).   Previously referred to neuropsychology for neurocognitive  evaluation - pending appt with Dr Kieth Brightly PsyD at [facility] PM&R department.   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      Relevant past medical, surgical, family and social history reviewed and updated as indicated. Interim medical history since our last visit reviewed. Allergies and medications reviewed and updated. Outpatient Medications Prior to Visit  Medication Sig Dispense Refill   acetaminophen (TYLENOL) 500 MG tablet Take 1,000 mg by mouth daily.     albuterol (VENTOLIN HFA) 108 (90 Base) MCG/ACT inhaler Inhale 2 puffs into the lungs every 6 (six) hours as needed. 8 g 2   Ascorbic Acid (VITAMIN C) 100 MG tablet Take 100 mg by mouth daily.     aspirin EC 81 MG tablet Take 1 tablet (81 mg total) by mouth daily. Swallow whole. 30 tablet 12   Blood Glucose Monitoring Suppl DEVI 1 each by Does not apply route in the morning, at noon, and at bedtime. E13.319. May substitute to any manufacturer covered by patient's insurance. 1 each 0   Cholecalciferol (VITAMIN D3) 25 MCG (1000 UT) CAPS Take 2 capsules (2,000 Units total) by mouth daily.     clopidogrel (PLAVIX) 75 MG tablet TAKE 1 TABLET(75 MG) BY MOUTH EVERY EVENING  30 tablet 11   diclofenac Sodium (VOLTAREN) 1 % GEL Apply 2 g topically 3 (three) times daily. 100 g 1   donepezil (ARICEPT) 5 MG tablet Take 1 tablet (5 mg total) by mouth at bedtime. 30 tablet 3   Fluticasone-Umeclidin-Vilant (TRELEGY ELLIPTA) 100-62.5-25 MCG/ACT AEPB Inhale 1 puff into the lungs daily. 28 each 11   Glucagon, rDNA, (GLUCAGON EMERGENCY) 1 MG KIT INJECT into THE muscle ONCE AS NEEDED FOR emergency 1 kit 12   glucose blood (ONETOUCH ULTRA) test strip USE THREE TIMES DAILY 100 strip 3   insulin lispro (HUMALOG) 100 UNIT/ML injection Inject  0.05 mLs (5 Units total) into the skin 3 (three) times daily before meals.     losartan (COZAAR) 50 MG tablet Take 0.5 tablets (25 mg total) by mouth daily.     Multiple Vitamin (MULTIVITAMIN ADULT) TABS Take 1 tablet by mouth in the morning and at bedtime.     rosuvastatin (CRESTOR) 20 MG tablet Take 1 tablet (20 mg total) by mouth every evening. 90 tablet 4   SYNTHROID 75 MCG tablet TAKE ONE TABLET BY MOUTH Monday-Saturday BEFORE breakfast 80 tablet 4   torsemide (DEMADEX) 20 MG tablet Take 1 tablet (20 mg total) by mouth daily. 60 tablet 5   insulin degludec (TRESIBA FLEXTOUCH) 100 UNIT/ML FlexTouch Pen Inject 55 Units into the skin at bedtime.     topiramate (TOPAMAX) 50 MG tablet Take 1 tablet (50 mg total) by mouth daily. 90 tablet 3   nitroGLYCERIN (NITROSTAT) 0.4 MG SL tablet Place 1 tablet (0.4 mg total) under the tongue every 5 (five) minutes as needed for chest pain. 25 tablet PRN   isosorbide mononitrate (IMDUR) 30 MG 24 hr tablet Take 0.5 tablets (15 mg total) by mouth daily. 30 tablet 6   No facility-administered medications prior to visit.     Per HPI unless specifically indicated in ROS section below Review of Systems  Objective:  BP 120/62   Pulse 96   Temp 97.6 F (36.4 C) (Oral)   Ht 5' 2.75" (1.594 m)   Wt 148 lb (67.1 kg)   SpO2 94%   BMI 26.43 kg/m   Wt Readings from Last 3 Encounters:  01/05/24 148 lb (67.1 kg)  12/20/23 147 lb (66.7 kg)  12/13/23 149 lb 8 oz (67.8 kg)      Physical Exam Vitals and nursing note reviewed.  Constitutional:      Appearance: Normal appearance. She is not ill-appearing.  Neurological:     Mental Status: She is alert.  Psychiatric:        Mood and Affect: Mood normal.        Behavior: Behavior normal.     Comments: Perseveration        Results for orders placed or performed in visit on 12/13/23  Hemoglobin A1c   Collection Time: 12/11/23 12:00 AM  Result Value Ref Range   Hemoglobin A1C 12.2    *Note: Due to a  large number of results and/or encounters for the requested time period, some results have not been displayed. A complete set of results can be found in Results Review.    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  She has decided not to move to [place] .  She continues to express interest in insulin pump - I advised I  don't manage insulin pumps, and am hesitant to use this given trouble with simplified insulin regimen. I remain concerned about her ability to self administer insulin regimen correctly at home due to progressive memory difficulty, she has been hesitant to have family assist.  Discussed with daughter. Again reviewed simplified insulin regimen - Tresiba 55u nightly, Lispro 5u with meals. It seems when she takes insulin correctly she has good sugar control.       Relevant Medications   insulin degludec (TRESIBA FLEXTOUCH) 100 UNIT/ML FlexTouch Pen   Essential hypertension   Brings home BP cuff which seems to read higher than our office cuff.  Overall good control.  Continue losartan 50mg  daily. Restart toprol XL 12.5mg  daily and imdur 15mg  daily - she had not been taking these.      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  She notes frequent use of SL nitroglycerin - discussed how this can contribute to headaches.  She has not been taking imdur - rec restart 15mg  daily.  She has not been taking topamax - rec restart 25mg  topiramate nightly for HA ppx.       Relevant Medications   topiramate (TOPAMAX) 25 MG tablet   metoprolol succinate (TOPROL-XL) 25 MG 24 hr tablet    MCI (mild cognitive impairment) with memory loss - Primary   Progressive memory difficulty  Recent aricept 5mg  nightly start.  Seems to be tolerating well.  Will appreciate neurology input.  Pending neurocognitive evaluation through [facility] PM&R (Dr Matt Holmes) next month.          Meds ordered this encounter  Medications   isosorbide mononitrate (IMDUR) 30 MG 24 hr tablet    Sig: Take 0.5 tablets (15 mg total) by mouth daily.    Dispense:  30 tablet    Refill:  6   insulin degludec (TRESIBA FLEXTOUCH) 100 UNIT/ML FlexTouch Pen    Sig: Inject 55 Units into the skin at bedtime.    Dispense:  60 mL    Refill:  3   DISCONTD: metoprolol succinate (TOPROL-XL) 25 MG 24 hr tablet    Sig: Take 1 tablet (25 mg total) by mouth daily.    Dispense:  90 tablet    Refill:  3   topiramate (TOPAMAX) 25 MG tablet    Sig: Take 1 tablet (25 mg total) by mouth at bedtime. For headache prevention    Dispense:  30 tablet    Refill:  6   metoprolol succinate (TOPROL-XL) 25 MG 24 hr tablet    Sig: Take 0.5 tablets (12.5 mg total) by mouth daily.    Dispense:  45 tablet    Refill:  3    Use 1/2 tablet dose    No orders of the defined types were placed in this encounter.   Patient Instructions  Look at new medicine list provided today.  Restart isosorbide mononitrate 30mg  1/2 tablet daily - this should help decrease need for nitroglycerin under the tongue.  Restart Toprol XL 25mg  1/2 tablet daily - sent to pharmacy Restart topiramate 25mg  nightly.  I have refilled Evaristo Bury to take 55 units nightly - every night regardless of meals.   New sugar log sheet provided today.  I will send prescription for continuous glucose monitor to [employer].  Insulin  pump will need to come from diabetes doctor (endocrinologist).   Insulin regimen: Tresiba 55u nightly at 10pm (pens) Lispro 5u before meals (pens)  Follow up plan: No follow-ups on file.  Claire Crick, MD

## 2024-01-05 NOTE — Telephone Encounter (Signed)
 Copied from CRM [id number]. Topic: General - Other >> Jan 05, 2024 10:59 AM Lorin Glass B wrote: Reason for CRM: Lawanna Kobus from [employer] calling to confirm if fax was received and sent back for patients DME. Forms requested are Edwards pump LMN, Edwards CGM LMN, and office notes.   [facility] Call[facility] [phone] ext. [phone]  Fax: [phone]

## 2024-01-08 ENCOUNTER — Encounter: Payer: Self-pay | Admitting: Family Medicine

## 2024-01-08 NOTE — Telephone Encounter (Signed)
 Faxed form.

## 2024-01-08 NOTE — Telephone Encounter (Signed)
Filled and in my outbox

## 2024-01-08 NOTE — Assessment & Plan Note (Signed)
 Progressive memory difficulty  Recent aricept 5mg  nightly start.  Seems to be tolerating well.  Will appreciate neurology input.  Pending neurocognitive evaluation through [facility] PM&R (Dr Georgeanne King) next month.

## 2024-01-08 NOTE — Assessment & Plan Note (Addendum)
 Still has not brought sugar readings to review.  She has decided not to move to [place] .  She continues to express interest in insulin pump - I advised I don't manage insulin pumps, and am hesitant to use this given trouble with simplified insulin regimen. I remain concerned about her ability to self administer insulin regimen correctly at home due to progressive memory difficulty, she has been hesitant to have family assist.  Discussed with daughter. Again reviewed simplified insulin regimen - Tresiba 55u nightly, Lispro 5u with meals. It seems when she takes insulin correctly she has good sugar control.

## 2024-01-08 NOTE — Assessment & Plan Note (Addendum)
 Brings home BP cuff which seems to read higher than our office cuff.  Overall good control.  Continue losartan 50mg  daily. Restart toprol XL 12.5mg  daily and imdur 15mg  daily - she had not been taking these.

## 2024-01-08 NOTE — Assessment & Plan Note (Signed)
 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  She notes frequent use of SL nitroglycerin - discussed how this can contribute to headaches.  She has not been taking imdur - rec restart 15mg  daily.  She has not been taking topamax - rec restart 25mg  topiramate nightly for HA ppx.

## 2024-01-10 ENCOUNTER — Ambulatory Visit: Payer: PPO | Admitting: Pulmonary Disease

## 2024-01-10 ENCOUNTER — Encounter: Payer: Self-pay | Admitting: Pulmonary Disease

## 2024-01-10 VITALS — BP 96/60 | HR 87 | Temp 97.8°F | Ht 62.75 in | Wt 146.6 lb

## 2024-01-10 DIAGNOSIS — R413 Other amnesia: Secondary | ICD-10-CM | POA: Diagnosis not present

## 2024-01-10 DIAGNOSIS — Z9189 Other specified personal risk factors, not elsewhere classified: Secondary | ICD-10-CM | POA: Diagnosis not present

## 2024-01-10 DIAGNOSIS — J4530 Mild persistent asthma, uncomplicated: Secondary | ICD-10-CM | POA: Diagnosis not present

## 2024-01-10 DIAGNOSIS — R202 Paresthesia of skin: Secondary | ICD-10-CM | POA: Diagnosis not present

## 2024-01-10 DIAGNOSIS — R5382 Chronic fatigue, unspecified: Secondary | ICD-10-CM

## 2024-01-10 DIAGNOSIS — R0602 Shortness of breath: Secondary | ICD-10-CM | POA: Diagnosis not present

## 2024-01-10 DIAGNOSIS — R200 Anesthesia of skin: Secondary | ICD-10-CM | POA: Diagnosis not present

## 2024-01-10 DIAGNOSIS — R42 Dizziness and giddiness: Secondary | ICD-10-CM | POA: Diagnosis not present

## 2024-01-10 DIAGNOSIS — R55 Syncope and collapse: Secondary | ICD-10-CM | POA: Diagnosis not present

## 2024-01-10 DIAGNOSIS — R519 Headache, unspecified: Secondary | ICD-10-CM | POA: Diagnosis not present

## 2024-01-10 DIAGNOSIS — G500 Trigeminal neuralgia: Secondary | ICD-10-CM | POA: Diagnosis not present

## 2024-01-10 MED ORDER — TRELEGY ELLIPTA 100-62.5-25 MCG/ACT IN AEPB: 1.0000 | INHALATION_SPRAY | Freq: Every day | RESPIRATORY_TRACT | 11 refills | Status: DC

## 2024-01-10 MED ORDER — ALBUTEROL SULFATE HFA 108 (90 BASE) MCG/ACT IN AERS: 2.0000 | INHALATION_SPRAY | Freq: Four times a day (QID) | RESPIRATORY_TRACT | 2 refills | Status: AC | PRN

## 2024-01-10 NOTE — Progress Notes (Signed)
 Subjective:    Patient ID: Cassandra Benson, female    DOB: 11-21-1940, 83 y.o.   MRN: [id number]  Patient Care Team: Eustaquio Boyden, MD as PCP - General (Family Medicine) Antonieta Iba, MD as PCP - Cardiology (Cardiology) Galen Manila, MD as Referring Physician (Ophthalmology) Salena Saner, MD as Consulting Physician (Pulmonary Disease)  Chief Complaint  Patient presents with   Follow-up    Shortness of breath on exertion and at rest. Occasional cough.     BACKGROUND/INTERVAL: Patient is an 83 year old lifelong never smoker with a history as noted below, who presents for follow-up on the issue of dyspnea.  Last seen on 07 September 2023.  Most recent admission to the hospital was on 30 November 2023 due to an episode of syncope.  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  This is a scheduled follow-up visit.  HPI Discussed the use of AI scribe software for clinical note transcription with the patient, who gave verbal consent to proceed.  History of Present Illness   Cassandra Benson is an 83 year old female with mild persistent asthma who presents with dyspnea on exertion and decreased stamina.  She experiences dyspnea on exertion and decreased stamina, impacting her daily activities. She feels 'wiped out' after walking short distances, such as to the mailbox, but can still manage housework. She uses Trelegy for asthma management and finds it helpful, but requires albuterol at least once or twice daily. She has not tried pulmonary rehabilitation.  She follows a vegetarian diet, primarily consuming fruits, salads, and carrots, and does not eat meat. She does not focus on protein intake, which may impact muscle health. She experiences muscle pain in the back of her legs, which she wonders if it may be due to her cholesterol medication.  She has a history of type 1 diabetes and is aware of the challenges it presents, including  metabolic syndrome. She expresses frustration with dietary advice received in the past. No fevers or chills are present. She is concerned about her symptoms due to her family history.   Of note her symptom of dyspnea is out of proportion to her PFT findings which are mild.    DATA 12/24/2021 echocardiogram: LVEF 60 to 65%, grade 1 DD, normal right ventricular systolic function.  Normal pulmonary artery systolic pressure.  Mild MR. 04/11/2022 CT chest high-resolution: Mild pulmonary fibrosis pattern with apical to basal gradient, no significant air trapping on expiratory phase.  Not consistent with UIP.  Findings are very mild. 04/22/2022 PFTs: FEV1 1.91 L or 101% predicted, FVC 2.98 L or 117% predicted, FEV1/FVC 64%.  No bronchodilator response, lung volumes normal.  Mild diffusion capacity impairment.  Consistent with mild obstructive airways disease. 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 08/01/2022 connective tissue disease panel, acetylcholine receptor assay: Negative (notably ANA negative). 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  Patient declined CPAP/oxygen. 12/02/2022 right and left heart cath: 50% stenosis of the ostium of the RCA with pressure dampening.  Improvement with intracoronary nitroglycerin jesting component of vasospasm.  No angiographically significant coronary artery disease noted in the left coronary artery.  Normal left and right heart filling pressures.  Significant vasospasm noted on the right radial artery. 12/23/2022 echocardiogram: LVEF 60 to 65%, normal LV function, grade 1 DD, no aortic or mitral valve dysfunction. 01/13/2023 chest x-ray PA and lateral: No active pulmonary  disease. 01/13/2023 VQ scan: Negative for pulmonary emboli. 01/13/2023 lower extremity Dopplers: No evidence of deep venous thrombosis in either lower extremity. 12/01/2023  echocardiogram: LVEF 60 to 65% LV with normal function.  Grade 1 DD.  Right side normal, normal pulmonary artery systolic pressure.  No evidence of valvular abnormality.  Review of Systems A 10 point review of systems was performed and it is as noted above otherwise negative.   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   Chronic diastolic heart failure (HCC) 12/02/2022   Allergy to beta blocker 08/06/2022   Steroid-induced hyperglycemia 05/16/2022   Insomnia 02/01/2022   Osteopenia 01/15/2022   Chronic dyspnea 12/22/2021   Unsteadiness 02/13/2021   Atherosclerosis of aorta (HCC) 12/16/2020   Grade II diastolic dysfunction 08/01/2020   Chronic venous insufficiency 04/07/2020   Partial small bowel obstruction (HCC) 01/15/2020   Epistaxis 01/13/2020   Renal insufficiency 09/14/2019   Chronic midline thoracic back pain 05/14/2019   Sjogren's syndrome without extraglandular involvement (HCC) 02/26/2019   Epigastric discomfort 12/17/2018   PAD (peripheral artery disease) (HCC) 10/04/2018   Raynaud disease 09/04/2018   Amaurosis fugax, both eyes 06/18/2018   TIA (transient ischemic attack) 05/21/2018   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   Facial paresthesia 02/07/2018   Pain and swelling of lower leg 12/25/2017   Fibromyalgia    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), bilateral 08/21/2017   Chronic right-sided headache 03/13/2017   Numbness and tingling of both lower extremities 03/13/2017   6th nerve palsy, 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   Fatty liver 02/13/2017   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) 10/03/2016   Medicare annual wellness visit, subsequent 06/19/2015   Encounter for general adult medical examination with abnormal findings 06/19/2015    Advanced care planning/counseling discussion 06/19/2015   Carotid stenosis, 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 06/19/2015   Vitamin D deficiency    Family history of premature CAD 06/02/2015   Chest pain 05/26/2015   Elevated testosterone level in female 09/04/2014   Hyperlipidemia associated with type 2 diabetes mellitus (HCC) 07/30/2014   Essential hypertension 07/17/2014   Hypothyroidism due to Hashimoto's thyroiditis 07/17/2014   OSA (obstructive sleep apnea) 08/22/2012   Chronic low back pain 02/27/2012   Positive ANA (antinuclear antibody) 01/06/2012    Social History   Tobacco Use   Smoking status: Never   Smokeless tobacco: Never  Substance Use Topics   Alcohol use: No    Allergies  Allergen Reactions   Iodinated Contrast Media Other (See Comments)    Itching (severe) and chest tightness   Penicillins Anaphylaxis, Swelling, Rash and Other (See Comments)    Has patient had a PCN reaction causing immediate rash, facial/tongue/throat swelling, SOB or lightheadedness with hypotension: Yes Has patient had a PCN reaction causing severe rash involving mucus membranes or skin necrosis: yes - remotely Has patient had a PCN reaction that required hospitalization: occurred while hospitalized Has patient had a PCN reaction occurring within the last 10 years: No If all of the above answers are "NO", then may proceed with Cephalosporin use.    Amlodipine Swelling    Pedal edema   Anectine [Succinylcholine] Other (See Comments)    Brother went into cardiac arrest.   Carvedilol Dermatitis    Facial acne   Codeine Nausea Only  Gabapentin Other (See Comments)    Gait abnormality   Influenza Vaccines Other (See Comments)    Muscle weakness; unable to walk   Insulin Aspart (Human Analog) Other (See Comments)    headache   Nortriptyline Other (See Comments)    Eye swelling and mouth drawed up   Prednisone     Marked severe hyperglycemia to oral and IM steroids   Valsartan Other  (See Comments) and Cough    Allergy to generic only, "Hacking" cough   Zetia [Ezetimibe] Other (See Comments)    Bad muscle cramps   Erythromycin Rash and Swelling   Sulfa Antibiotics Rash    Current Meds  Medication Sig   acetaminophen (TYLENOL) 500 MG tablet Take 1,000 mg by mouth daily.   Ascorbic Acid (VITAMIN C) 100 MG tablet Take 100 mg by mouth daily.   aspirin EC 81 MG tablet Take 1 tablet (81 mg total) by mouth daily. Swallow whole.   Blood Glucose Monitoring Suppl DEVI 1 each by Does not apply route in the morning, at noon, and at bedtime. E13.319. May substitute to any manufacturer covered by patient's insurance.   Cholecalciferol (VITAMIN D3) 25 MCG (1000 UT) CAPS Take 2 capsules (2,000 Units total) by mouth daily.   clopidogrel (PLAVIX) 75 MG tablet TAKE 1 TABLET(75 MG) BY MOUTH EVERY EVENING   diclofenac Sodium (VOLTAREN) 1 % GEL Apply 2 g topically 3 (three) times daily.   donepezil (ARICEPT) 5 MG tablet Take 1 tablet (5 mg total) by mouth at bedtime.   Glucagon, rDNA, (GLUCAGON EMERGENCY) 1 MG KIT INJECT into THE muscle ONCE AS NEEDED FOR emergency   glucose blood (ONETOUCH ULTRA) test strip USE THREE TIMES DAILY   insulin degludec (TRESIBA FLEXTOUCH) 100 UNIT/ML FlexTouch Pen Inject 55 Units into the skin at bedtime.   insulin lispro (HUMALOG) 100 UNIT/ML injection Inject 0.05 mLs (5 Units total) into the skin 3 (three) times daily before meals.   isosorbide mononitrate (IMDUR) 30 MG 24 hr tablet Take 0.5 tablets (15 mg total) by mouth daily.   losartan (COZAAR) 50 MG tablet Take 0.5 tablets (25 mg total) by mouth daily.   metoprolol succinate (TOPROL-XL) 25 MG 24 hr tablet Take 0.5 tablets (12.5 mg total) by mouth daily.   Multiple Vitamin (MULTIVITAMIN ADULT) TABS Take 1 tablet by mouth in the morning and at bedtime.   rosuvastatin (CRESTOR) 20 MG tablet Take 1 tablet (20 mg total) by mouth every evening.   SYNTHROID 75 MCG tablet TAKE ONE TABLET BY MOUTH Monday-Saturday  BEFORE breakfast   topiramate (TOPAMAX) 25 MG tablet Take 1 tablet (25 mg total) by mouth at bedtime. For headache prevention   torsemide (DEMADEX) 20 MG tablet Take 1 tablet (20 mg total) by mouth daily.   [DISCONTINUED] albuterol (VENTOLIN HFA) 108 (90 Base) MCG/ACT inhaler Inhale 2 puffs into the lungs every 6 (six) hours as needed.   [DISCONTINUED] Fluticasone-Umeclidin-Vilant (TRELEGY ELLIPTA) 100-62.5-25 MCG/ACT AEPB Inhale 1 puff into the lungs daily.    Immunization History  Administered Date(s) Administered   Pneumococcal Conjugate-13 09/23/2014   Pneumococcal Polysaccharide-23 01/05/2010, 07/17/2013   Tdap 10/07/2011        Objective:     BP 96/60 (BP Location: Left Arm, Patient Position: Sitting, Cuff Size: Normal)   Pulse 87   Temp 97.8 F (36.6 C) (Temporal)   Ht 5' 2.75" (1.594 m)   Wt 146 lb 9.6 oz (66.5 kg)   SpO2 98%   BMI 26.18 kg/m   SpO2:  98 %  GENERAL: Well-developed, well-nourished woman, no acute distress.  Fully ambulatory with no conversational dyspnea.  Nasal quality to speech.  No dysphonia noted today. HEAD: Normocephalic, atraumatic.  EYES: Pupils equal, round, reactive to light.  No scleral icterus.  MOUTH: Oral mucosa moist.  No thrush. NECK: Supple. No thyromegaly. Trachea midline. No JVD.  No adenopathy. PULMONARY: Good air entry bilaterally.  No adventitious sounds. CARDIOVASCULAR: S1 and S2. Regular rate and rhythm.  No rubs, murmurs or gallops heard. ABDOMEN: Significant truncal obesity, otherwise benign. MUSCULOSKELETAL: No joint deformity, no clubbing, no edema of the lower extremities.  NEUROLOGIC: No overt focal deficit, no gait disturbance, speech is fluent. SKIN: Intact,warm,dry.  No overt rashes, exam limited. PSYCH: Mood is depressed, behavior normal.   Assessment & Plan:     ICD-10-CM   1. Mild persistent asthma without complication  J45.30     2. Shortness of breath  R06.02 AMB referral to pulmonary rehabilitation   Out  of proportion to mild asthma    3. Chronic fatigue  R53.82     4. Has poorly balanced diet  Z91.89       Orders Placed This Encounter  Procedures   AMB referral to pulmonary rehabilitation    Referral Priority:   Routine    Referral Type:   Consultation    Number of Visits Requested:   1    Meds ordered this encounter  Medications   albuterol (VENTOLIN HFA) 108 (90 Base) MCG/ACT inhaler    Sig: Inhale 2 puffs into the lungs every 6 (six) hours as needed.    Dispense:  8 g    Refill:  2   Fluticasone-Umeclidin-Vilant (TRELEGY ELLIPTA) 100-62.5-25 MCG/ACT AEPB    Sig: Inhale 1 puff into the lungs daily.    Dispense:  28 each    Refill:  11   Discussion:    Mild persistent asthma Increased frequency of discomfort and lack of stamina, out of proportion to mild asthma. Uses albuterol at least once or twice daily, indicating suboptimal control. Lung examination normal. Finds Trelegy helpful for management. Pulmonary rehabilitation proposed to improve stamina and respiratory function, with sessions three times a week to help with overall conditioning - Refill Trelegy - Refer to pulmonary rehabilitation  Metabolic syndrome Type 1 diabetes with generalized weakness, possibly related to metabolic syndrome. Follows a vegan diet, potentially contributing to inadequate protein intake, affecting muscle strength and metabolism. Concerned about glucose control and dietary choices. Discussed importance of protein intake for muscle maintenance, especially with aging, and potential benefit of protein drinks like Glucerna for glycemic control. - Consider consult with dietitian regarding protein intake and metabolic syndrome management - Consider protein drinks like Glucerna for glycemic control  Statin-associated muscle symptoms Muscle pain in the back of her legs, potentially related to statins. Known side effect, alternative medications may be needed. Advised to discuss with primary care  physician about potential statin side effects and alternatives. - Discuss with primary care physician (Doctor Mariam Shingles) about potential statin side effects and alternative medications      Advised if symptoms do not improve or worsen, to please contact office for sooner follow up or seek emergency care.    I spent 34 minutes of dedicated to the care of this patient on the date of this encounter to include pre-visit review of records, face-to-face time with the patient discussing conditions above, post visit ordering of testing, clinical documentation with the electronic health record, making appropriate referrals as documented, and communicating  necessary findings to members of the patients care team.     C. Chloe Counter, MD Advanced Bronchoscopy PCCM [facility]    *This note was generated using voice recognition software/Dragon and/or AI transcription program.  Despite best efforts to proofread, errors can occur which can change the meaning. Any transcriptional errors that result from this process are unintentional and may not be fully corrected at the time of dictation.

## 2024-01-10 NOTE — Patient Instructions (Addendum)
 VISIT SUMMARY:  Today, we discussed your increased shortness of breath and decreased stamina, as well as your concerns about muscle pain and diet. We reviewed your current medications and dietary habits, and proposed some changes to help manage your conditions more effectively.  YOUR PLAN:  -MILD PERSISTENT ASTHMA: Mild persistent asthma means you have asthma symptoms more than twice a week but not daily. You are using albuterol daily, which suggests your asthma is not fully controlled. We recommend continuing Trelegy and starting pulmonary rehabilitation three times a week to improve your stamina and respiratory function.  -METABOLIC SYNDROME: Metabolic syndrome is a group of conditions that increase your risk of heart disease, stroke, and diabetes. Your current diet seems to be low in protein, which is important for muscle health. We suggest consulting a dietitian to ensure you get enough protein and considering protein drinks like Glucerna to help with glycemic control.  -STATIN-ASSOCIATED MUSCLE SYMPTOMS: Statin-associated muscle symptoms are muscle pains that can occur as a side effect of cholesterol-lowering medications. You should discuss these symptoms with your primary care physician, Dr. Mariam Shingles, to explore alternative medications.  INSTRUCTIONS:  Please follow up with a dietitian to discuss your protein intake and metabolic syndrome management. Additionally, schedule an appointment with Dr. Mariam Shingles to talk about your muscle pain and potential alternatives to your current statin medication. Begin pulmonary rehabilitation sessions three times a week to help improve your stamina and respiratory function.  Will see you in follow-up in 6 months time call sooner should any new problems arise.

## 2024-01-19 ENCOUNTER — Encounter: Payer: Self-pay | Admitting: Family Medicine

## 2024-01-19 ENCOUNTER — Ambulatory Visit (INDEPENDENT_AMBULATORY_CARE_PROVIDER_SITE_OTHER): Admitting: Family Medicine

## 2024-01-19 VITALS — BP 130/70 | HR 95 | Temp 98.2°F | Ht 62.75 in | Wt 146.4 lb

## 2024-01-19 DIAGNOSIS — G8929 Other chronic pain: Secondary | ICD-10-CM

## 2024-01-19 DIAGNOSIS — I10 Essential (primary) hypertension: Secondary | ICD-10-CM | POA: Diagnosis not present

## 2024-01-19 DIAGNOSIS — E13319 Other specified diabetes mellitus with unspecified diabetic retinopathy without macular edema: Secondary | ICD-10-CM

## 2024-01-19 DIAGNOSIS — R519 Headache, unspecified: Secondary | ICD-10-CM | POA: Diagnosis not present

## 2024-01-19 DIAGNOSIS — G3184 Mild cognitive impairment, so stated: Secondary | ICD-10-CM

## 2024-01-19 MED ORDER — DONEPEZIL HCL 10 MG PO TABS: 10.0000 mg | ORAL_TABLET | Freq: Every day | ORAL | 6 refills | Status: DC

## 2024-01-19 NOTE — Assessment & Plan Note (Signed)
 Chronic, well controlled on current regimen - continue low dose metoprolol , losartan , imdur .

## 2024-01-19 NOTE — Assessment & Plan Note (Signed)
 Saw neuro Dr Walden Guise - ?trigeminal neuralgia - started on carbamazepine.  ?aricept  related - monitor for worsening headache on higher dose 10mg  nightly. ?nitrate related given she is regularly using SL nitroglycerin . Continue lwo dose imdur  15mg  daily.  Not consistent with GCA.

## 2024-01-19 NOTE — Assessment & Plan Note (Signed)
 Progressive.  Anticipate mild dementia.  Recommend she bring her sister to next visit.  Increase donepezil  to 10mg  nightly, reviewed side effects to watch for.

## 2024-01-19 NOTE — Patient Instructions (Addendum)
 Continue carbamazepine 100mg  twice daily for headache / trigeminal neuralgia.  Increase donepezil  10mg  nightly - this is a memory medicine. Let me know if headache or GI upset worsens.  Continue long acting insulin  Tresiba  55 units every night.  Continue short acting insulin  Lispro 5 units before meals.  Jot down sugars as best you can and bring in to next appointment.  Bring in a sugar log to next visit.  Return to see me in 4 weeks. Bring in your sister to next visit.  I will be out of the office from May 8th through 20th.

## 2024-01-19 NOTE — Assessment & Plan Note (Signed)
 Did not bring sugar log.  Endorses improved readings at home.  Assume this may be from more regular adherence to simplified regimen. Not due yet for A1c.  Continue current regimen.

## 2024-01-19 NOTE — Progress Notes (Signed)
 Ph: [phone] Fax: [phone]   Patient ID: Cassandra Benson, female    DOB: [birth year], 83 y.o.   MRN: [id number]  This visit was conducted in person.  BP 130/70   Pulse 95   Temp 98.2 F (36.8 C) (Oral)   Ht 5' 2.75" (1.594 m)   Wt 146 lb 6 oz (66.4 kg)   SpO2 98%   BMI 26.14 kg/m   BP Readings from Last 3 Encounters:  01/19/24 130/70  01/10/24 96/60  01/05/24 120/62    CC: 2 wk fu visit  Subjective:   HPI: Cassandra Benson is a 83 y.o. female presenting on 01/19/2024 for Medical Management of Chronic Issues (Here for 2 wk f/u.)   She presents alone today. She drove to appointment.  Aleda Hurl daughter continues asking her to move to [place] .  She continues journaling.   See prior notes for details.  Donepezil  5mg  nightly recently started 11/2023 for noted memory deterioration.  Subsequently saw neurology Dr Walden Guise - restarted carbamazepine to 100mg  bid for trigeminal neuralgia. Rec against driving due to memory trouble.  She is unsure if carbamazepine has helped.   Previously referred to neuropsychology for neurocognitive evaluation - pending appt with Dr Cheryll Corti PsyD at [facility] PM&R department for next month.   Insulin  dependent T2DM - difficulty managing insulin  due to progressive memory trouble.  Current diabetic regimen: Tresiba  55u nightly at 10pm (pens) Lispro 5u before meals (pens) (Simplified regimen to facilitate adherence)  She did not bring in sugar readings.  She states sugars are running 86 -126 at home.  Rare low sugars <70.  Lab Results  Component Value Date   HGBA1C 12.2 12/11/2023   She is not using continuous glucose monitor.   Saw pulmonology - pending pulmonary rehab.      Relevant past medical, surgical, family and social history reviewed and updated as indicated. Interim medical history since our last visit reviewed. Allergies and medications reviewed and updated. Outpatient Medications Prior to Visit  Medication Sig  Dispense Refill   acetaminophen  (TYLENOL ) 500 MG tablet Take 1,000 mg by mouth daily.     albuterol  (VENTOLIN  HFA) 108 (90 Base) MCG/ACT inhaler Inhale 2 puffs into the lungs every 6 (six) hours as needed. 8 g 2   Ascorbic Acid  (VITAMIN C ) 100 MG tablet Take 100 mg by mouth daily.     aspirin  EC 81 MG tablet Take 1 tablet (81 mg total) by mouth daily. Swallow whole. 30 tablet 12   Blood Glucose Monitoring Suppl DEVI 1 each by Does not apply route in the morning, at noon, and at bedtime. E13.319. May substitute to any manufacturer covered by patient's insurance. 1 each 0   Cholecalciferol  (VITAMIN D3) 25 MCG (1000 UT) CAPS Take 2 capsules (2,000 Units total) by mouth daily.     clopidogrel  (PLAVIX ) 75 MG tablet TAKE 1 TABLET(75 MG) BY MOUTH EVERY EVENING 30 tablet 11   diclofenac  Sodium (VOLTAREN ) 1 % GEL Apply 2 g topically 3 (three) times daily. 100 g 1   Fluticasone-Umeclidin-Vilant (TRELEGY ELLIPTA ) 100-62.5-25 MCG/ACT AEPB Inhale 1 puff into the lungs daily. 28 each 11   Glucagon , rDNA, (GLUCAGON  EMERGENCY) 1 MG KIT INJECT into THE muscle ONCE AS NEEDED FOR emergency 1 kit 12   glucose blood (ONETOUCH ULTRA) test strip USE THREE TIMES DAILY 100 strip 3   insulin  degludec (TRESIBA  FLEXTOUCH) 100 UNIT/ML FlexTouch Pen Inject 55 Units into the skin at bedtime. 60 mL 3   insulin  lispro (HUMALOG ) 100  UNIT/ML injection Inject 0.05 mLs (5 Units total) into the skin 3 (three) times daily before meals.     isosorbide  mononitrate (IMDUR ) 30 MG 24 hr tablet Take 0.5 tablets (15 mg total) by mouth daily. 30 tablet 6   losartan  (COZAAR ) 50 MG tablet Take 0.5 tablets (25 mg total) by mouth daily.     metoprolol  succinate (TOPROL -XL) 25 MG 24 hr tablet Take 0.5 tablets (12.5 mg total) by mouth daily. 45 tablet 3   Multiple Vitamin (MULTIVITAMIN ADULT) TABS Take 1 tablet by mouth in the morning and at bedtime.     rosuvastatin  (CRESTOR ) 20 MG tablet Take 1 tablet (20 mg total) by mouth every evening. 90 tablet  4   SYNTHROID  75 MCG tablet TAKE ONE TABLET BY MOUTH Monday-Saturday BEFORE breakfast 80 tablet 4   topiramate  (TOPAMAX ) 25 MG tablet Take 1 tablet (25 mg total) by mouth at bedtime. For headache prevention 30 tablet 6   torsemide  (DEMADEX ) 20 MG tablet Take 1 tablet (20 mg total) by mouth daily. 60 tablet 5   donepezil  (ARICEPT ) 5 MG tablet Take 1 tablet (5 mg total) by mouth at bedtime. 30 tablet 3   nitroGLYCERIN  (NITROSTAT ) 0.4 MG SL tablet Place 1 tablet (0.4 mg total) under the tongue every 5 (five) minutes as needed for chest pain. 25 tablet PRN   No facility-administered medications prior to visit.     Per HPI unless specifically indicated in ROS section below Review of Systems  Objective:  BP 130/70   Pulse 95   Temp 98.2 F (36.8 C) (Oral)   Ht 5' 2.75" (1.594 m)   Wt 146 lb 6 oz (66.4 kg)   SpO2 98%   BMI 26.14 kg/m   Wt Readings from Last 3 Encounters:  01/19/24 146 lb 6 oz (66.4 kg)  01/10/24 146 lb 9.6 oz (66.5 kg)  01/05/24 148 lb (67.1 kg)      Physical Exam Vitals and nursing note reviewed.  Constitutional:      Appearance: Normal appearance. She is not ill-appearing.  HENT:     Head: Normocephalic and atraumatic.     Mouth/Throat:     Mouth: Mucous membranes are moist.     Pharynx: Oropharynx is clear. No oropharyngeal exudate or posterior oropharyngeal erythema.  Eyes:     Extraocular Movements: Extraocular movements intact.     Pupils: Pupils are equal, round, and reactive to light.  Cardiovascular:     Rate and Rhythm: Normal rate and regular rhythm.     Pulses: Normal pulses.     Heart sounds: Normal heart sounds. No murmur heard. Pulmonary:     Effort: Pulmonary effort is normal. No respiratory distress.     Breath sounds: Normal breath sounds. No wheezing, rhonchi or rales.  Musculoskeletal:     Right lower leg: No edema.     Left lower leg: No edema.  Skin:    General: Skin is warm and dry.     Findings: No rash.  Neurological:      Mental Status: She is alert.  Psychiatric:        Mood and Affect: Mood normal.        Behavior: Behavior normal.       Results for orders placed or performed in visit on 12/13/23  Hemoglobin A1c   Collection Time: 12/11/23 12:00 AM  Result Value Ref Range   Hemoglobin A1C 12.2    *Note: Due to a large number of results and/or encounters for the  requested time period, some results have not been displayed. A complete set of results can be found in Results Review.    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  Endorses improved readings at home.  Assume this may be from more regular adherence to simplified regimen. Not due yet for A1c.  Continue current regimen.       Essential hypertension   Chronic, well controlled on current regimen - continue low dose metoprolol , losartan , imdur .      Chronic right-sided headache   Saw neuro Dr Walden Guise - ?trigeminal neuralgia - started on carbamazepine.  ?aricept  related - monitor for worsening headache on higher dose 10mg  nightly. ?nitrate related given she is regularly using SL nitroglycerin . Continue lwo dose imdur  15mg  daily.  Not consistent with GCA.       MCI (mild cognitive impairment) with memory loss   Progressive.  Anticipate mild dementia.  Recommend she bring her sister to next visit.  Increase donepezil  to 10mg  nightly, reviewed side effects to watch for.         Meds ordered this encounter  Medications   donepezil  (ARICEPT ) 10 MG tablet    Sig: Take 1 tablet (10 mg total) by mouth at bedtime. For memory    Dispense:  30 tablet    Refill:  6    Note increased dose    No orders of the defined types were placed in this encounter.   Patient Instructions  Continue carbamazepine 100mg  twice daily for headache / trigeminal neuralgia.  Increase donepezil  10mg   nightly - this is a memory medicine. Let me know if headache or GI upset worsens.  Continue long acting insulin  Tresiba  55 units every night.  Continue short acting insulin  Lispro 5 units before meals.  Jot down sugars as best you can and bring in to next appointment.  Bring in a sugar log to next visit.  Return to see me in 4 weeks. Bring in your sister to next visit.  I will be out of the office from May 8th through 20th.   Follow up plan: Return in about 4 weeks (around 02/16/2024), or if symptoms worsen or fail to improve, for follow up visit.  Claire Crick, MD

## 2024-01-23 ENCOUNTER — Telehealth: Payer: Self-pay

## 2024-01-23 NOTE — Telephone Encounter (Addendum)
 I filled out CGM order. I do not manage insulin  pumps so I will not fill out that order. I indicated that in the order request and spoke with pt about it.

## 2024-01-23 NOTE — Telephone Encounter (Signed)
 Copied from CRM [id number]. Topic: General - Other >> Jan 23, 2024  1:52 PM Howard Macho wrote: Reason for CRM: Abraham Hoffmann from [employer] called stating they need chart notes and a prescription order signed for a insulin  pump system. Abraham Hoffmann stated they received one order that was signed but the other order was not signed. Abraham Hoffmann stated one of the orders someone wrote on it that it needs to go to  endocrinology so sara does not know if the doctor is going to sign it. Abraham Hoffmann stated one order is one CGM and other needed is for the insulin  pump CB [phone] ext [phone]

## 2024-01-24 NOTE — Telephone Encounter (Signed)
 Copied from CRM [id number]. Topic: General - Other >> Jan 24, 2024  2:46 PM Rosamond Comes wrote: Reason for CRM: patient calling to let provider know she received new pump that showed up at her house, it had no name on it no box, patient did not order this [employer] pump.  Someone from [employer] called patient and asked questions, patient stated she wanted to talk to provider about this before doing with anything with [employer] pump. Patient wants to let Dr Mariam Shingles know she will do what he will advise her to do.    Patient would like to have a call back regarding this problem.  Phone [phone]  or cell [phone]   Called patient let her know to reach out to Endo as they are the ones that order. She will give office a call. She will call office back if any further questions.

## 2024-01-24 NOTE — Telephone Encounter (Signed)
 Noted. Spoke with [employer] notifying them order needs to go to endo, per Dr Seabron Cypress understanding and documented in pt's account.

## 2024-01-25 ENCOUNTER — Telehealth: Payer: Self-pay

## 2024-01-25 NOTE — Telephone Encounter (Signed)
 Copied from CRM [id number]. Topic: Clinical - Medication Question >> Jan 25, 2024 12:40 PM Shereese L wrote: Reason for CRM: patient stated that she doesn't have any memory test done to bring to the appt. She would a call to verify what's all really needed or if she needs to reschedule

## 2024-01-26 NOTE — Telephone Encounter (Signed)
 Spoke with pt about message. Pt states she will discuss things with Dr Crissie Dome at 01/29/24 OV.

## 2024-01-29 ENCOUNTER — Encounter: Payer: Self-pay | Admitting: Family Medicine

## 2024-01-29 ENCOUNTER — Ambulatory Visit (INDEPENDENT_AMBULATORY_CARE_PROVIDER_SITE_OTHER): Admitting: Family Medicine

## 2024-01-29 VITALS — BP 130/78 | HR 102 | Temp 97.5°F | Ht 62.75 in | Wt 150.0 lb

## 2024-01-29 DIAGNOSIS — Z79899 Other long term (current) drug therapy: Secondary | ICD-10-CM

## 2024-01-29 DIAGNOSIS — R519 Headache, unspecified: Secondary | ICD-10-CM

## 2024-01-29 DIAGNOSIS — G3184 Mild cognitive impairment, so stated: Secondary | ICD-10-CM | POA: Diagnosis not present

## 2024-01-29 DIAGNOSIS — E13319 Other specified diabetes mellitus with unspecified diabetic retinopathy without macular edema: Secondary | ICD-10-CM | POA: Diagnosis not present

## 2024-01-29 DIAGNOSIS — G8929 Other chronic pain: Secondary | ICD-10-CM

## 2024-01-29 NOTE — Patient Instructions (Addendum)
 Drop off your sugar log to review readings over the next few days.   Continue long acting insulin  Tresiba  55 units every night.  Continue short acting insulin  Lispro 5 units before meals.   Bring in a sugar log to next visit.  Return to see me in 4 weeks. Bring in your sister to next visit.   We will try to get you set up with transportation to medical appointments.   Restart Aricept  10mg  nightly as well as try the carbamazepine 100mg  as Dr Walden Guise prescribed.   I will be out of the office from May 8th through 20th.

## 2024-01-29 NOTE — Progress Notes (Unsigned)
 Ph: [phone] Fax: [phone]   Patient ID: Cassandra Benson, female    DOB: Feb 15, 1941, 83 y.o.   MRN: [id number]  This visit was conducted in person.  BP 130/78   Pulse (!) 102   Temp (!) 97.5 F (36.4 C) (Oral)   Ht 5' 2.75" (1.594 m)   Wt 150 lb (68 kg)   SpO2 98%   BMI 26.78 kg/m    CC: 4 wk f/u visit  Subjective:   HPI: Cassandra Benson is a 83 y.o. female presenting on 01/29/2024 for Medical Management of Chronic Issues (Here for 4 wk f/u; was able to log sugar but left at home, remember being below 200 in the last two weeks. Only concern is still having pain in the same place on my head (hot to touch))   See prior notes for details.  She presents alone today. She drove to appointment today.   Donepezil  nightly started 11/2023 for noted memory deterioration, dose increased to 10mg  on 01/19/2024 - she has not yet started higher dose.   Ongoing R temporal headache. Neurology saw her, recommended starting carbamazepine for trigeminal neuralgia. This was present even prior to donepezil  commencement.   Pending neuropsychology for neurocognitive evaluation - pending appt with Dr Cheryll Corti PsyD at [facility] PM&R department for next month.   MR brain without contrast 11/2023 - no acute process.   Insulin  dependent T2DM - difficulty managing insulin  due to progressive memory trouble.  She states she has been recording blood sugars but forgot her log at home today. She states home sugar readings are running <200. I asked her to have son or DIL drop off to review readings.  Current diabetic regimen: Tresiba  55u nightly at 10pm (pens) Lispro 5u before meals (pens) (Simplified regimen to facilitate adherence)  She is not using continuous glucose monitor.  Lab Results  Component Value Date   HGBA1C 12.2 12/11/2023       Relevant past medical, surgical, family and social history reviewed and updated as indicated. Interim medical history since our last visit  reviewed. Allergies and medications reviewed and updated. Outpatient Medications Prior to Visit  Medication Sig Dispense Refill   acetaminophen  (TYLENOL ) 500 MG tablet Take 1,000 mg by mouth daily.     albuterol  (VENTOLIN  HFA) 108 (90 Base) MCG/ACT inhaler Inhale 2 puffs into the lungs every 6 (six) hours as needed. 8 g 2   Ascorbic Acid  (VITAMIN C ) 100 MG tablet Take 100 mg by mouth daily.     aspirin  EC 81 MG tablet Take 1 tablet (81 mg total) by mouth daily. Swallow whole. 30 tablet 12   Blood Glucose Monitoring Suppl DEVI 1 each by Does not apply route in the morning, at noon, and at bedtime. E13.319. May substitute to any manufacturer covered by patient's insurance. 1 each 0   Cholecalciferol  (VITAMIN D3) 25 MCG (1000 UT) CAPS Take 2 capsules (2,000 Units total) by mouth daily.     clopidogrel  (PLAVIX ) 75 MG tablet TAKE 1 TABLET(75 MG) BY MOUTH EVERY EVENING 30 tablet 11   diclofenac  Sodium (VOLTAREN ) 1 % GEL Apply 2 g topically 3 (three) times daily. 100 g 1   donepezil  (ARICEPT ) 10 MG tablet Take 1 tablet (10 mg total) by mouth at bedtime. For memory 30 tablet 6   Fluticasone-Umeclidin-Vilant (TRELEGY ELLIPTA ) 100-62.5-25 MCG/ACT AEPB Inhale 1 puff into the lungs daily. 28 each 11   glucose blood (ONETOUCH ULTRA) test strip USE THREE TIMES DAILY 100 strip 3  insulin  degludec (TRESIBA  FLEXTOUCH) 100 UNIT/ML FlexTouch Pen Inject 55 Units into the skin at bedtime. 60 mL 3   insulin  lispro (HUMALOG ) 100 UNIT/ML injection Inject 0.05 mLs (5 Units total) into the skin 3 (three) times daily before meals.     isosorbide  mononitrate (IMDUR ) 30 MG 24 hr tablet Take 0.5 tablets (15 mg total) by mouth daily. 30 tablet 6   losartan  (COZAAR ) 50 MG tablet Take 0.5 tablets (25 mg total) by mouth daily.     metoprolol  succinate (TOPROL -XL) 25 MG 24 hr tablet Take 0.5 tablets (12.5 mg total) by mouth daily. 45 tablet 3   Multiple Vitamin (MULTIVITAMIN ADULT) TABS Take 1 tablet by mouth in the morning and  at bedtime.     rosuvastatin  (CRESTOR ) 20 MG tablet Take 1 tablet (20 mg total) by mouth every evening. 90 tablet 4   SYNTHROID  75 MCG tablet TAKE ONE TABLET BY MOUTH Monday-Saturday BEFORE breakfast 80 tablet 4   topiramate  (TOPAMAX ) 25 MG tablet Take 1 tablet (25 mg total) by mouth at bedtime. For headache prevention 30 tablet 6   torsemide  (DEMADEX ) 20 MG tablet Take 1 tablet (20 mg total) by mouth daily. 60 tablet 5   Glucagon , rDNA, (GLUCAGON  EMERGENCY) 1 MG KIT INJECT into THE muscle ONCE AS NEEDED FOR emergency (Patient not taking: Reported on 01/29/2024) 1 kit 12   nitroGLYCERIN  (NITROSTAT ) 0.4 MG SL tablet Place 1 tablet (0.4 mg total) under the tongue every 5 (five) minutes as needed for chest pain. 25 tablet PRN   No facility-administered medications prior to visit.     Per HPI unless specifically indicated in ROS section below Review of Systems  Objective:  BP 130/78   Pulse (!) 102   Temp (!) 97.5 F (36.4 C) (Oral)   Ht 5' 2.75" (1.594 m)   Wt 150 lb (68 kg)   SpO2 98%   BMI 26.78 kg/m   Wt Readings from Last 3 Encounters:  01/29/24 150 lb (68 kg)  01/19/24 146 lb 6 oz (66.4 kg)  01/10/24 146 lb 9.6 oz (66.5 kg)      Physical Exam Vitals and nursing note reviewed.  Constitutional:      Appearance: Normal appearance. She is not ill-appearing.  Neurological:     Mental Status: She is alert.  Psychiatric:        Mood and Affect: Mood normal.        Behavior: Behavior normal.       Results for orders placed or performed in visit on 12/13/23  Hemoglobin A1c   Collection Time: 12/11/23 12:00 AM  Result Value Ref Range   Hemoglobin A1C 12.2    *Note: Due to a large number of results and/or encounters for the requested time period, some results have not been displayed. A complete set of results can be found in Results Review.    Assessment & Plan:   Problem List Items Addressed This Visit   None    No orders of the defined types were placed in this  encounter.   No orders of the defined types were placed in this encounter.   Patient Instructions  Drop off your sugar log to review readings over the next few days.   Continue long acting insulin  Tresiba  55 units every night.  Continue short acting insulin  Lispro 5 units before meals.   Bring in a sugar log to next visit.  Return to see me in 4 weeks. Bring in your sister to next visit.  We will try to get you set up with transportation to medical appointments.   Restart Aricept  10mg  nightly as well as try the carbamazepine 100mg  as Dr Walden Guise prescribed.   I will be out of the office from May 8th through 20th.   Follow up plan: Return in about 4 weeks (around 02/26/2024).  Claire Crick, MD

## 2024-01-30 ENCOUNTER — Telehealth: Payer: Self-pay

## 2024-01-30 ENCOUNTER — Encounter: Payer: Self-pay | Admitting: Family Medicine

## 2024-01-30 DIAGNOSIS — Z79899 Other long term (current) drug therapy: Secondary | ICD-10-CM | POA: Insufficient documentation

## 2024-01-30 NOTE — Progress Notes (Signed)
 Complex Care Management Note  Care Guide Note 01/30/2024 Name: Cassandra Benson MRN: [id number] DOB: [birth year]  Cassandra Benson is a 83 y.o. year old female who sees Claire Crick, MD for primary care. I reached out to Lyla Samuels by phone today to offer complex care management services.  [name] was given information about Complex Care Management services today including:   The Complex Care Management services include support from the care team which includes your Nurse Care Manager, Clinical Social Worker, or Pharmacist.  The Complex Care Management team is here to help remove barriers to the health concerns and goals most important to you. Complex Care Management services are voluntary, and the patient may decline or stop services at any time by request to their care team member.   Complex Care Management Consent Status: Patient agreed to services and verbal consent obtained.   Follow up plan:  Telephone appointment with complex care management team member scheduled for:  [facility] 02/02/2024 and LCSW 02/06/2024  Encounter Outcome:  Patient Scheduled  Lenton Rail , RMA     [place]  [facility], [facility] Guide  Direct Dial: [phone]  Website: [email or web address]

## 2024-01-30 NOTE — Assessment & Plan Note (Signed)
 Progressive, anticipate mild dementia.  She may not be taking aricept  10mg  nightly - states she will go to pharmacy to pick this up today.  Upcoming neurocognitive evaluation in [facility] 5/8, 5/12, 5/14, 5/16. She has trouble driving to [place]. Map printed for patient. I will also place referral to [facility] for case management and social worker assistance - see if we can get her set up for medical transport for these upcoming appointments.

## 2024-01-30 NOTE — Assessment & Plan Note (Signed)
 Saw Neuro Dr Walden Guise 4/16, started carbamazepine for possible trigeminal neuralgia.  It seems pt is not yet taking - she will go to pharmacy today to pick this up.  Headache present prior to aricept  commencement. Previous negative evaluation for GCA.

## 2024-01-30 NOTE — Assessment & Plan Note (Signed)
 Again did not bring sugar log.  Endorses all home readings running <200. Has had some lows to 70s.  I asked her drop off sugar log to review readings.  RTC 4 wks DM f/u visit

## 2024-02-01 ENCOUNTER — Telehealth: Payer: Self-pay | Admitting: *Deleted

## 2024-02-01 ENCOUNTER — Encounter: Attending: Psychology | Admitting: Psychology

## 2024-02-01 DIAGNOSIS — R413 Other amnesia: Secondary | ICD-10-CM | POA: Diagnosis not present

## 2024-02-01 NOTE — Progress Notes (Signed)
 NEUROPSYCHOLOGICAL EVALUATION [place]  Physical Medicine and Rehabilitation     Patient: Cassandra Benson  MRN: [id number] DOB: Feb 15, 1941  Age: 83 y.o. Sex: female  Race/Ethnicity: White or Caucasian  Years of Education: 14 Handedness: Right  Referring Provider: Claire Crick, MD  Provider/Clinical Neuropsychologist: Loletta Ripple, PsyD  Date of Service: 02/01/2024 Start Time: 10 AM End Time: 12 PM  Location of Service:  [facility] Physical Medicine & Rehabilitation Department [place]. [facility] [address] [place] [place], [place] [address] Phone: [phone]  Billing Code/Service:            96116/96121  Individuals Present: Patient was seen unaccompanied, in-person, by the provider and the provider's office. 1 hour and 15 minutes spent in face-to-face clinical interview and remaining 45 minutes was spent in record review, documentation, and testing protocol construction.    PATIENT CONSENT AND CONFIDENTIALITY The patient's understanding of the reason for referral was intact. Discussed limits of confidentiality including, but not limited to, posting of final evaluation report in the patient's electronic medical record for both the patient and for the referring provider and appropriate medical professionals. Patient was given the opportunity to have their questions answered. The neuropsychological evaluation process was discussed with the patient and they consented to proceed with the evaluation.  Consent for Evaluation and Treatment: Signed: Yes Explanation of Privacy Policies: Signed: Yes Discussion of Confidentiality Limits: Yes  REASON FOR REFERRAL:The patient was referred for neuropsychological evaluation by her primary care provider, Dr. Mariam Shingles, due to concerns for cognitive decline. Records dated February 2025 from primary care indicate the patient was seen for headache and problems with memory and mental status.  Notes from that visit report difficulty with getting lost while traveling home from local family holiday celebration. Her score on a mental status exam was 25 out of 30 and her clock drawing was scored a 1 out of 4, but the patient was reported to be independent in all ADLs/IADLs. The referring provider indicated suspicion of multifactorial etiology including vascular and suboptimal sugar control.  Patient was subsequently referred for neuropsychological evaluation.  Records from her neurology visit on 01/10/2024 note "She is here to be evaluated for worsening head pain and memory concerns... She has been hospitalized multiple times in 2024 and 2025 for near syncope and shortness of breath... Headaches occur almost daily in the right parietal lobe with 9/10 pain. Denies dizziness, nausea, or vomiting secondary to headaches... ." Neurology notes indicate patient and family concerns about memory loss as well. The patient was again seen by primary care on 01/29/2024.  Records from that visit indicate the patient was seen by neurology in the interim for ongoing right temporal headache and medication was recommended for trigeminal neuralgia.  MRI imaging from May 2025 was reported to have shown no acute processes.  Records state the patient is having some difficulty with managing insulin  for diabetes due to memory trouble and treatment regime was simplified and efforts to improve adherence.   Upon interview, the patient indicated she was undergoing the evaluation at the recommendation of her primary care physician.  The patient described considerable distress relating to changes in her physical health, specifically the right-sided pain on her head and problems with breathing and energy which impact her activity levels.  She also expressed concern about her cognitive changes and was hoping to understand what was underlying her difficulties.  HISTORY OF PRESENTING CONCERNS: The following was obtained from the patient via  clinical interview.  Cognitive Symptom Onset & Course: The patient initially indicated that she noticed cognitive changes over the last 6 months.  However, she later noted that her daughter began providing assistance with managing bills in 2022 due to memory problems.  The relationship between the patient's headache and cognitive symptoms was unclear outside of her initially reported onset of cognitive changes.  The patient reported that her cognitive symptoms are variable and course rather than clearly progressive or stable.  Current Cognitive Complaints:  Memory: Endorsed difficulties with remembering conversations and recent events.  Described instances of getting lost while driving in familiar locations.  Denied any significant changes in frequency of misplacing things.  The patient denied being told by others that she repeats herself.  She reported no difficulties and remains independent in management of medical appointments and medications. Processing Speed: The patient indicated there may be a slight declines in processing speed. Attention & Concentration: No clear declines in attention and concentration. Language: Denied difficulties with word-finding or other aspects of expressive language.  Denied any changes or problems involving receptive language.  Reads regularly and without difficulty.  No problems with writing and no problems with basic mental math. Visual-Spatial: No clear indications of changes or declines in visual-spatial abilities. Executive Functioning: Denied significant changes or problems in decision making, problem solving, planning, organization, or marked behavioral changes reflecting disinhibition.  The patient described her personality as having changed but follow-up questioning was limited and clarifying the nature of this change.  Overall, the patient seems to have a decline in activity levels overall related to her physical health and wellness, particularly with her  shortness of breath.  Motor/Sensory Complaints:   Sensory changes: No significant changes in sense of smell or taste.  Reported changes in her vision in her right eye related to the head pain.  Descriptions imply that visual clarity is sometimes impacted and problems are episodic. Balance/coordination difficulties: The patient reported some changes in balance that appeared to be primarily episodic.  Has had two falls in the last year and a half.  Denied hitting her head in these instances. Frequent instances of dizziness/vertigo: Infrequent but occasional and tending to occur intermittently for a day or two at a time. Other motor difficulties: Denied any problems with tremor or changes in handwriting.  Emotional and Behavioral Functioning: The patient denies any clinically significant psychiatric symptoms but describes indications of distress related to recent changes in her health. Depression: Denied.  Denied any problems with frequent feelings of low mood, anhedonia, or suicidal ideation.  Cited her faith a major factor in her maintaining her mental health. Anxiety: Denied any problems with generalized worry or other indications of significant anxiety when asked directly. Other: Denied any current or past suicidal ideation, homicidal ideation, hallucination, paranoia, or any other notable mental health concerns.  Denied any history of treatment or diagnosis.  Sleep:  Estimated hours obtained each night: Seven Difficulties falling asleep: None Difficulties staying asleep: None Feels rested and refreshed upon awakening: Yes History of obstructive sleep apnea: Yes (does not currently have CPAP device). Patient described difficulty tolerating the CPAP mask due to earlier experiences in life.  History of vivid dreaming: Denied.  Appetite: Adequate. Caffeine : Minimal. Alcohol Use: Effectively none. Tobacco Use: No Recreational Substance Use: No  Level of Functional Independence: The patient is  fully independent with basic activities of daily living.  Finances: Managed by daughter since 2022. Shopping / Meal Preparation: Independent Household Maintenance / Chores: Printmaker /  Future Obligations: Independent Medication Management: Independent.  Frequently uses pill organizer but is able to remember her medications without. Driving: Limits driving to certain locations only due to her own concerns about safety relating to her vision.  Medical History/Record Review:  History of traumatic brain injury/concussion: None. History of heart attack: None. 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): poor glycemic control per 11/11/23  Essential hypertension: improved control with medication change Hypothyroidism due to Hashimoto's thyroiditis: stable on medication Hyperlipidemia associated with type 2 diabetes mellitus (HCC): uncontrolled Carotid stenosis, 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 Chronic right-sided headache: Topamax   Past Medical History:  Diagnosis Date   Acute diverticulitis 04/2021   [facility] ER, CT confirmed   Allergy    ANA positive    positive ANA pattern 1 speckled   Arthritis    Carotid stenosis, asymptomatic 06/19/2015   1-39% RICA 40-59% LICA rpt 1 yr (05/2015)    CHF (congestive heart failure) (HCC)    Colon polyps    COVID-19 virus infection 09/14/2021   Dermatomyositis (HCC)    Diabetes mellitus without complication (HCC)    Type 1   Diverticulosis    sigmoid on CT scan 12/2019   Family history of adverse reaction to anesthesia    brothr went into cardiac arrest from anectine   Fibromyalgia    prior PCP   GERD (gastroesophageal reflux disease)    prior PCP   Glaucoma    Narrow angle   History of blood clots    DVT, in 20s, none since   History of chicken pox     History of diverticulitis    History of pericarditis 1986   with hospitalization   History of pneumonia 2014   History of shingles    History of UTI    Hyperlipidemia    Hypertension    Hypothyroidism    Mixed connective tissue disease (HCC)    Partial small bowel obstruction (HCC) 12/2019   managed conservatively   Peptic ulcer    Pneumonia    PONV (postoperative nausea and vomiting)    Raynaud's disease without gangrene    Shoulder pain left   h/o RTC tendonitis and adhesive capsulitis   Sigmoid diverticulitis 05/26/2021   Sjogren's syndrome (HCC)    Sleep apnea    prior PCP - no CPAP for about 10 yrs   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HCC)    Vitamin D  deficiency    prior PCP    Patient Active Problem List   Diagnosis Date Noted   Polypharmacy 01/30/2024   Syncope 11/30/2023   MCI (mild cognitive impairment) with memory loss 10/09/2023   Asthma 10/09/2023   Vision loss, bilateral 02/03/2023   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   Chronic diastolic heart failure (HCC) 12/02/2022   Allergy to beta blocker 08/06/2022   Steroid-induced hyperglycemia 05/16/2022   Insomnia 02/01/2022   Osteopenia 01/15/2022   Chronic dyspnea 12/22/2021   Unsteadiness 02/13/2021   Atherosclerosis of aorta (HCC) 12/16/2020   Grade II diastolic dysfunction 08/01/2020   Chronic venous insufficiency 04/07/2020   Partial small bowel obstruction (HCC) 01/15/2020   Epistaxis 01/13/2020   Renal insufficiency 09/14/2019   Chronic midline thoracic back pain 05/14/2019   Sjogren's syndrome without extraglandular involvement (HCC) 02/26/2019   Epigastric discomfort 12/17/2018   PAD (peripheral artery disease) (HCC) 10/04/2018   Raynaud disease 09/04/2018   Amaurosis  fugax, both eyes 06/18/2018   TIA (transient ischemic attack) 05/21/2018   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   Facial paresthesia 02/07/2018   Pain and swelling of lower leg 12/25/2017   Fibromyalgia    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), bilateral 08/21/2017   Chronic right-sided headache 03/13/2017   Numbness and tingling of both lower extremities 03/13/2017   6th nerve palsy, 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   Fatty liver 02/13/2017   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) 10/03/2016   Medicare annual wellness visit, subsequent 06/19/2015   Encounter for general adult medical examination with abnormal findings 06/19/2015   Advanced care planning/counseling discussion 06/19/2015   Carotid stenosis, 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 06/19/2015   Vitamin D  deficiency    Family history of premature CAD 06/02/2015   Chest pain 05/26/2015   Elevated testosterone  level in female 09/04/2014   Hyperlipidemia associated with type 2 diabetes mellitus (HCC) 07/30/2014   Essential hypertension 07/17/2014   Hypothyroidism due to Hashimoto's thyroiditis 07/17/2014   OSA (obstructive sleep apnea) 08/22/2012   Chronic low back pain 02/27/2012   Positive ANA (antinuclear antibody) 01/06/2012    Imaging/Lab Results:  11/30/2023 MR BRAIN WO CONTRAST IMPRESSION: No evidence of acute intracranial abnormality. 11/30/2023 CT HEAD AND CSPINE WO CONTRAST IMPRESSION:  1. No acute intracranial abnormality.  2. No acute fracture or traumatic malalignment in the cervical  spine.  3. Degenerative changes of the cervical spine as above.  02/16/2023 MR BRAIN WO CONTRAST IMPRESSION:  1. No acute intracranial abnormality.  2. Noncontrast MRI appearance of the brain has not significantly  changed since 2021 and is unremarkable for age.  01/07/2021 MRI BRAIN WO CONTRAST  IMPRESSION:  1. No acute intracranial abnormality.  2. Findings of chronic ischemic microangiopathy and generalized  volume loss without a clear lobar predilection.   Family Neurologic/Medical Hx: Family history of dementia, but uncertain who.  Family History  Problem Relation Age of Onset   CAD Mother 24       MI,  aortic valve issues   COPD Mother    Lupus Mother    Murrell Arrant' disease Mother    Rheum arthritis Mother    CAD Father 49       CABG x2, aortic valve replacement   Stroke Sister    CAD Sister    Anuerysm Sister        brain   Lupus Sister    Diabetes Sister    Diabetes Sister    Breast cancer Sister    Alcohol abuse Brother    CAD Brother 76       MI   Stroke Brother    COPD Brother        agent orange   CAD Brother 60       stent   Diabetes Brother    Stroke Maternal Grandmother    Hypertension Maternal Grandmother    Gallbladder disease Maternal Grandmother    Breast cancer Maternal Aunt    Breast cancer Maternal Aunt    Depression Grandchild    Colon cancer Neg Hx    Esophageal cancer Neg Hx    Rectal cancer Neg Hx    Stomach cancer Neg Hx     Medications:  acetaminophen  (TYLENOL ) 500 MG tablet albuterol  (VENTOLIN  HFA) 108 (90 Base) MCG/ACT inhaler Ascorbic Acid  (VITAMIN C ) 100 MG tablet aspirin  EC 81 MG tablet Blood Glucose Monitoring Suppl DEVI carbamazepine (CARBATROL) 100 MG 12 hr capsule Cholecalciferol  (VITAMIN D3) 25 MCG (1000 UT)  CAPS clopidogrel  (PLAVIX ) 75 MG tablet diclofenac  Sodium (VOLTAREN ) 1 % GEL donepezil  (ARICEPT ) 10 MG tablet Fluticasone-Umeclidin-Vilant (TRELEGY ELLIPTA ) 100-62.5-25 MCG/ACT AEPB Glucagon , rDNA, (GLUCAGON  EMERGENCY) 1 MG KIT glucose blood (ONETOUCH ULTRA) test strip insulin  degludec (TRESIBA  FLEXTOUCH) 100 UNIT/ML FlexTouch Pen insulin  lispro (HUMALOG ) 100 UNIT/ML injection isosorbide  mononitrate (IMDUR ) 30 MG 24 hr tablet losartan  (COZAAR ) 50 MG tablet metoprolol  succinate (TOPROL -XL) 25 MG 24 hr tablet Multiple Vitamin (MULTIVITAMIN ADULT) TABS nitroGLYCERIN  (NITROSTAT ) 0.4 MG SL tablet (Expired) rosuvastatin  (CRESTOR ) 20 MG tablet SYNTHROID  75 MCG tablet topiramate  (TOPAMAX ) 25 MG tablet torsemide  (DEMADEX ) 20 MG tablet  Academic/Vocational History: Highest level of educational attainment: 2.5 years of college  completed. Reportedly performed well in school (predominantly As) History of developmental delay:None History of grade repetition:None Enrollment in special education courses:None History of LD/ADHD:None  Employment: Retired approximately 10 years ago.  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  Was responsible for nine staff members.  Helped establish new corporate office [place].   Psychosocial: Marital Status: Widowed in 2017. Children/Grandchildren: Two children.  Four grandchildren, one deceased. Living Situation: Lives alone but has family in the area. Daily Activities/Hobbies: Reading, spending time with neighbors, spend time with other friends that meet regularly.  Reported decline in her activity overall due to getting out of breath more easily.  Mental Status/Behavioral Observations: The patient was seen on an outpatient basis in the [facility] office for the clinical interview unaccompanied. Sensorium/Arousal: Alert and attentive throughout the interview.  Hearing and vision were adequate. Orientation: Oriented to the purpose of the evaluation. Appearance: Appropriate for the setting. Behavior: The patient was very cooperative and provided significant amount of information.  Some difficulty with redirection.  The patient expressed concerns of being considered a "hypochondriac."  Speech/Language:Conversational speech was prosodic, fluent, and well-articulated.  No clear receptive language difficulties were noted. Motor: Ambulated independently without issue.  No tremor was observed. Social Comportment: Social interaction was unremarkable. Mood/Affect: Slightly variable.  The patient appeared slightly dysphoric in a few isolated instances when reflecting on her difficulties. Largely euthymic mood and pleasant.  Affect was largely consistent with mood. Thought Process/Content: Very mild tangentiality, but possibly  reflecting a desire to have her concerns fully articulated and understood. Thought process was largely linear and goal directed.  Ability to Participate in Interview: Readily responded to all questions posed by the provider. Sometimes struggled to provide specific details about her experiences initially, but follow-up questions were typically beneficial.   Insight:Fair to good.    SUMMARY / CLINICAL IMPRESSIONS The patient was referred for neuropsychological evaluation by her primary care provider, Dr. Mariam Shingles, due to concerns for cognitive decline. Records dated February 2025 from primary care indicate the patient was seen for headache and problems with memory and mental status. Notes from that visit report difficulty with getting lost while traveling home from local family holiday celebration. Her score on a mental status exam was 25 out of 30 and her clock drawing was scored a 1 out of 4, but the patient was reported to be independent in all ADLs/IADLs. The referring provider indicated suspicion of multifactorial etiology including vascular and suboptimal sugar control.  Patient was subsequently referred for neuropsychological evaluation.  Records from her neurology visit on 01/10/2024 note "She is here to be evaluated for worsening head pain and memory concerns... She has been hospitalized multiple times in 2024 and 2025 for near syncope and shortness of breath... Headaches occur almost daily in  the right parietal lobe with 9/10 pain. Denies dizziness, nausea, or vomiting secondary to headaches... ." Neurology notes indicate patient and family concerns about memory loss as well. The patient was again seen by primary care on 01/29/2024.  Records from that visit indicate the patient was seen by neurology in the interim for ongoing right temporal headache and medication was recommended for trigeminal neuralgia.  MRI imaging from May 2025 was reported to have shown no acute processes.  Records state the patient  is having some difficulty with managing insulin  for diabetes due to memory trouble and treatment regime was simplified and efforts to improve adherence.   Upon interview, the patient indicated she was undergoing the evaluation at the recommendation of her primary care physician.  The patient described considerable distress relating to changes in her physical health, specifically the right-sided pain on her head and problems with breathing and energy which impact her activity levels.  She also expressed concern about her cognitive changes and was hoping to understand what was underlying her difficulties. Overall initial impressions note the patient's distress related to changes in physical health and impacts on her overall activity levels. However, the patient herself denies problems with mood or anxiety explicitly. There have reportedly been some indications of changes in instrumental activities of daily living (finances managed by daughter since 2022) consistent with MCI. Data obtained through objective cognitive test data will likely be helpful in clarifying cognitive functioning and differential diagnosis. The patient agreed to allow the provider to contact her daughter if needed, but also indicated she did not want to upset or worry family. The provider informed the patient that he would wait to see the test data and then decide whether obtaining collateral from her daughter is needed. The patient was amenable to this.   DISPOSITION / PLAN  The patient has been set up for a formal neuropsychological assessment to objectively assess her cognitive functioning across domains to establish the patient's cognitive profile. This data, in conjunction with information obtained via clinical interview and medical record review, will help clarify likely etiology and guide treatment recommendations. Once data collection and interpretation have been completed, the findings / diagnosis and recommendations will be  reviewed and discussed with the patient during a feedback appointment with the neuropsychologist. Based on the collaborative dialogue with the patient during the feedback, recommendations may be adjusted / tailored as needed. A formal report will be produced and provided to the patient and the referring provider.   Diagnosis: Memory Loss    This report was generated using voice recognition software. While this document has been carefully reviewed, transcription errors may be present. I apologize in advance for any inconvenience. Please contact me if further clarification is needed.             Loletta Ripple, PsyD             Neuropsychologist

## 2024-02-01 NOTE — Progress Notes (Signed)
 Complex Care Management Note Care Guide Note  02/01/2024 Name: Cassandra Benson MRN: [id number] DOB: 06/16/1941  Cassandra Benson is a 83 y.o. year old female who is a primary care patient of Claire Crick, MD . The community resource team was consulted for assistance with Transportation Needs , Food Insecurity, and Stressed   SDOH screenings and interventions completed:  Yes     SDOH Interventions Today    Flowsheet Row Most Recent Value  SDOH Interventions   Transportation Interventions [facility] (Specialized Community [employer]), [employer] Provided  [employer] community options of [employer]  and Link , also meals on wheels]        Care guide performed the following interventions: Patient provided with information about care guide support team and interviewed to confirm resource needs.  Follow Up Plan:  No further follow up planned at this time. The patient has been provided with needed resources.  Encounter Outcome:  Patient Visit Completed  [name]  [facility] HealthPopulation Health Care Guide  Direct Dial:[phone] Fax:[phone] Website: [name]

## 2024-02-02 ENCOUNTER — Other Ambulatory Visit: Payer: Self-pay

## 2024-02-02 ENCOUNTER — Telehealth: Payer: Self-pay

## 2024-02-02 ENCOUNTER — Telehealth: Payer: Self-pay | Admitting: Cardiovascular Disease

## 2024-02-02 NOTE — Telephone Encounter (Signed)
 Left voicemail message to call back

## 2024-02-02 NOTE — Telephone Encounter (Addendum)
 Spoke with Moira Andrews and advised patient would need to reach out to the HF clinic. We have not seen this patient in over a year. Called HF clinic and spoke with [place] and she will give patient a call.

## 2024-02-02 NOTE — Telephone Encounter (Signed)
 Received call from complex care coordinator that the pt was having concerning symptoms of swelling. Reached out to pt and pt states that she does have swelling in her upper abdomen and ankles. She also said that earlier when she was in contact with the other nurse, she was has experiencing chest pressure and pain. It was radiating to her back. At the time speaking to the pt, she denied any pain. Per Shawnee Dellen, FNP pt to take additional 20mg  of torsemide  today and a total of 40mg  of torsemide  tomorrow and follow up with the [facility] Monday. Also advised pt that should she have pain or pressure like she previously had, she would need to contact 911. Pt verbalized understanding and is agreeable.

## 2024-02-02 NOTE — Telephone Encounter (Signed)
 Pt c/o swelling/edema: STAT if pt has developed SOB within 24 hours  If swelling, where is the swelling located? Stomach    How much weight have you gained and in what time span? She does not have a scale at home    Have you gained 2 pounds in a day or 5 pounds in a week? Na   Do you have a log of your daily weights (if so, list)? Na   Are you currently taking a fluid pill? torsemide  (DEMADEX ) 20 MG table   Are you currently SOB? Yes and hurts to take a deep breath.   Have you traveled recently in a car or plane for an extended period of time? Na  [facility] Cassandra Benson called in and stated that she was just taking with the pt and the pt relayed all this info to her.  [phone] Best number for pt is [phone]

## 2024-02-02 NOTE — Patient Instructions (Signed)
 Visit Information  Thank you for taking time to visit with me today. Please don't hesitate to contact me if I can be of assistance to you before our next scheduled appointment.  Our next appointment is by telephone to complete initial visit. A member of the care team will reach out to schedule.  Please call the care guide team at [phone] if you need to cancel or reschedule your appointment.   Following is a copy of your care plan:   Goals Addressed   None     Please call the Suicide and Crisis Lifeline: 988 call 1-800-273-TALK (toll free, 24 hour hotline) if you are experiencing a Mental Health or Behavioral Health Crisis or need someone to talk to.  Patient verbalizes understanding of instructions and care plan provided today and agrees to view in MyChart. Active MyChart status and patient understanding of how to access instructions and care plan via MyChart confirmed with patient.     Cassandra Fish, RN MSN [place]  [facility] Population Health RN Care Manager Direct Dial: [phone]  Fax: [phone]

## 2024-02-02 NOTE — Patient Outreach (Signed)
 Complex Care Management   Visit Note  02/02/2024  Name:  Cassandra Benson MRN: [id number] DOB: [birth year]  Situation: Referral received for Complex Care Management related to Heart Failure, SDOH Barriers:  Transportation, and CHF I obtained verbal consent from Patient.  Visit completed with Loistine Rinne  on the phone. Visit ended prior to completion due to patient reported symptoms - see below. A member of the care team will reach out to patient to complete visit within the next 30 days.  Background:   Past Medical History:  Diagnosis Date   Acute diverticulitis 04/2021   [facility] ER, CT confirmed   Allergy    ANA positive    positive ANA pattern 1 speckled   Arthritis    Carotid stenosis, asymptomatic 06/19/2015   1-39% RICA 40-59% LICA rpt 1 yr (05/2015)    CHF (congestive heart failure) (HCC)    Colon polyps    COVID-19 virus infection 09/14/2021   Dermatomyositis (HCC)    Diabetes mellitus without complication (HCC)    Type 1   Diverticulosis    sigmoid on CT scan 12/2019   Family history of adverse reaction to anesthesia    brothr went into cardiac arrest from anectine   Fibromyalgia    prior PCP   GERD (gastroesophageal reflux disease)    prior PCP   Glaucoma    Narrow angle   History of blood clots    DVT, in 20s, none since   History of chicken pox    History of diverticulitis    History of pericarditis 1986   with hospitalization   History of pneumonia 2014   History of shingles    History of UTI    Hyperlipidemia    Hypertension    Hypothyroidism    Mixed connective tissue disease (HCC)    Partial small bowel obstruction (HCC) 12/2019   managed conservatively   Peptic ulcer    Pneumonia    PONV (postoperative nausea and vomiting)    Raynaud's disease without gangrene    Shoulder pain left   h/o RTC tendonitis and adhesive capsulitis   Sigmoid diverticulitis 05/26/2021   Sjogren's syndrome (HCC)    Sleep apnea    prior PCP - no CPAP for about 10  yrs   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HCC)    Vitamin D  deficiency    prior PCP    Assessment: Patient Reported Symptoms:  Cognitive        Neurological Neurological Review of Symptoms: Not assessed (Will assess at follow up visit)    HEENT HEENT Symptoms Reported: Not assessed (Will assess at follow up visit)      Cardiovascular Cardiovascular Symptoms Reported: Other: Other Cardiovascular Symptoms: Abdominal swelling Does patient have uncontrolled Hypertension?: No Cardiovascular Conditions: Heart failure, High blood cholesterol, Hypertension Cardiovascular Management Strategies: Medication therapy, Diet modification, Exercise Weight:  (Patient reports she needs a new scale so has not been able to weigh daily) Cardiovascular Comment: Call placed by [facility] to cardiology office and advanced heart failure clinic to report symptoms. Spoke with Jorge Newcomer at cardiology office, left voicemail for heart failure clinic.  Respiratory Respiratory Symptoms Reported: Other:, Shortness of breath Other Respiratory Symptoms: Patient states "it hurts to breathe" when taking deep breaths. Chronic shortness of breath, at baseline per patient. Patient reports that today is a bad day for her due to her breathing. RNCM noted patient's increased shortness of breath over the phone when she is walking around her home. Respiratory Conditions: Asthma, Sleep disordered breathing (Patient does  not wear CPAP)  Endocrine Patient reports the following symptoms related to hypoglycemia or hyperglycemia : Increased thirst Is patient diabetic?: Yes Is patient checking blood sugars at home?: Yes Endocrine Conditions: Diabetes, Thyroid  disorder Endocrine Management Strategies: Medication therapy, Diet modification, Exercise  Gastrointestinal Gastrointestinal Symptoms Reported: Not assessed (Will assess at follow up visit)      Genitourinary Genitourinary Symptoms Reported: Not assessed (Will assess at follow up visit)     Integumentary Integumentary Symptoms Reported: Not assessed (Will assess at follow up visit)    Musculoskeletal Musculoskelatal Symptoms Reviewed: Not assessed (Will assess at follow up visit)        Psychosocial Psychosocial Symptoms Reported: Not assessed (Will assess at follow up visit)            01/29/2024    2:59 PM  Depression screen PHQ 2/9  Decreased Interest 0  Down, Depressed, Hopeless 0  PHQ - 2 Score 0  Altered sleeping 0  Tired, decreased energy 0  Change in appetite 0  Feeling bad or failure about yourself  0  Trouble concentrating 0  Moving slowly or fidgety/restless 0  Suicidal thoughts 0  PHQ-9 Score 0  Difficult doing work/chores Not difficult at all    There were no vitals filed for this visit.  Medications Reviewed Today     Reviewed by Valaria Garland, RN (Registered Nurse) on 02/02/24 at 1310  Med List Status: <None>   Medication Order Taking? Sig Documenting Provider Last Dose Status Informant  acetaminophen  (TYLENOL ) 500 MG tablet [id number]  Take 1,000 mg by mouth daily. Claire Crick, MD  Active Self, Pharmacy Records           Med Note [facility], JOYCE   Thu Nov 30, 2023 10:25 PM) prn  albuterol  (VENTOLIN  HFA) 108 (90 Base) MCG/ACT inhaler [id number]  Inhale 2 puffs into the lungs every 6 (six) hours as needed. Marc Senior, MD  Active   Ascorbic Acid  (VITAMIN C ) 100 MG tablet [id number]  Take 100 mg by mouth daily. [provider]  Active Self, Pharmacy Records  aspirin  EC 81 MG tablet [id number]  Take 1 tablet (81 mg total) by mouth daily. Swallow whole. Claire Crick, MD  Active Self, Pharmacy Records  Blood Glucose Monitoring Suppl DEVI [id number] Yes 1 each by Does not apply route in the morning, at noon, and at bedtime. E13.319. May substitute to any manufacturer covered by patient's insurance. Claire Crick, MD Taking Active Self  carbamazepine (CARBATROL) 100 MG 12 hr capsule [id number]  Take 1 capsule (100 mg total)  by mouth 2 (two) times daily. For trigeminal neuralgia Claire Crick, MD  Active   Cholecalciferol  (VITAMIN D3) 25 MCG (1000 UT) CAPS [id number]  Take 2 capsules (2,000 Units total) by mouth daily. Claire Crick, MD  Active Self, Pharmacy Records  clopidogrel  (PLAVIX ) 75 MG tablet [id number]  TAKE 1 TABLET(75 MG) BY MOUTH EVERY EVENING Young Hensen, MD  Active Self  diclofenac  Sodium (VOLTAREN ) 1 % GEL [id number]  Apply 2 g topically 3 (three) times daily. Claire Crick, MD  Active Self, Pharmacy Records  donepezil  (ARICEPT ) 10 MG tablet [id number]  Take 1 tablet (10 mg total) by mouth at bedtime. For memory Claire Crick, MD  Active   Fluticasone-Umeclidin-Vilant (TRELEGY ELLIPTA ) 100-62.5-25 MCG/ACT AEPB [id number]  Inhale 1 puff into the lungs daily. Marc Senior, MD  Active   Glucagon , rDNA, (GLUCAGON  EMERGENCY) 1 MG KIT [id number]  INJECT into THE muscle ONCE AS NEEDED FOR emergency  Patient  not taking: Reported on 01/29/2024   Emilie Harden, MD  Active Self, Pharmacy Records           Med Note Madelynn Schilder   Tue Jun 20, 2023 10:31 AM) Has not needed in a long time  glucose blood [facility] ULTRA) test strip [id number] Yes USE THREE TIMES DAILY Claire Crick, MD Taking Active   insulin  degludec (TRESIBA  FLEXTOUCH) 100 UNIT/ML FlexTouch Pen [id number]  Inject 55 Units into the skin at bedtime. Claire Crick, MD  Active   insulin  lispro (HUMALOG ) 100 UNIT/ML injection [id number]  Inject 0.05 mLs (5 Units total) into the skin 3 (three) times daily before meals. Claire Crick, MD  Active   isosorbide  mononitrate (IMDUR ) 30 MG 24 hr tablet [id number]  Take 0.5 tablets (15 mg total) by mouth daily. Claire Crick, MD  Active   losartan  (COZAAR ) 50 MG tablet [id number]  Take 0.5 tablets (25 mg total) by mouth daily. Jodeane Mulligan, DO  Active   metoprolol  succinate (TOPROL -XL) 25 MG 24 hr tablet [id number]  Take 0.5 tablets (12.5 mg total) by mouth daily.  Claire Crick, MD  Active   Multiple Vitamin (MULTIVITAMIN ADULT) TABS [id number]  Take 1 tablet by mouth in the morning and at bedtime. Claire Crick, MD  Active Self, Pharmacy Records  nitroGLYCERIN  (NITROSTAT ) 0.4 MG SL tablet [id number]  Place 1 tablet (0.4 mg total) under the tongue every 5 (five) minutes as needed for chest pain. Sammy Crisp, MD  Expired 12/02/23 2359 Self, Pharmacy Records  rosuvastatin  (CRESTOR ) 20 MG tablet [id number]  Take 1 tablet (20 mg total) by mouth every evening. Claire Crick, MD  Active Self  SYNTHROID  75 MCG tablet [id number]  TAKE ONE TABLET BY MOUTH Monday-Saturday BEFORE breakfast Claire Crick, MD  Active Self  topiramate  (TOPAMAX ) 25 MG tablet [id number]  Take 1 tablet (25 mg total) by mouth at bedtime. For headache prevention Claire Crick, MD  Active   torsemide  (DEMADEX ) 20 MG tablet [id number]  Take 1 tablet (20 mg total) by mouth daily. Jodeane Mulligan, DO  Active             Recommendation:   Calls placed to cardiology office and advanced heart failure clinic to report symptoms. Spoke with Jorge Newcomer at cardiology office who is forwarding information to triage nurse. RNCM advised patient to present to the ED for worsening of symptoms.  Follow Up Plan:   A member of the care team will reach out to patient over the next 30 days to complete initial visit  Theodora Fish, RN MSN [place]  [facility] RN Care Manager Direct Dial: [phone]  Fax: [phone]

## 2024-02-05 ENCOUNTER — Telehealth: Payer: Self-pay

## 2024-02-05 ENCOUNTER — Encounter: Admitting: Cardiology

## 2024-02-05 ENCOUNTER — Encounter

## 2024-02-06 ENCOUNTER — Other Ambulatory Visit: Payer: Self-pay | Admitting: Licensed Clinical Social Worker

## 2024-02-06 NOTE — Patient Outreach (Signed)
 Complex Care Management   Visit Note  02/06/2024  Name:  Cassandra Benson MRN: [id number] DOB: 1941-08-08  Situation: Referral received for Complex Care Management related to transporation and chf I obtained verbal consent from Patient.  Visit completed with Claudine Cullens  on the phone  Background:   Past Medical History:  Diagnosis Date   Acute diverticulitis 04/2021   [facility] ER, CT confirmed   Allergy    ANA positive    positive ANA pattern 1 speckled   Arthritis    Carotid stenosis, asymptomatic 06/19/2015   1-39% RICA 40-59% LICA rpt 1 yr (05/2015)    CHF (congestive heart failure) (HCC)    Colon polyps    COVID-19 virus infection 09/14/2021   Dermatomyositis (HCC)    Diabetes mellitus without complication (HCC)    Type 1   Diverticulosis    sigmoid on CT scan 12/2019   Family history of adverse reaction to anesthesia    brothr went into cardiac arrest from anectine   Fibromyalgia    prior PCP   GERD (gastroesophageal reflux disease)    prior PCP   Glaucoma    Narrow angle   History of blood clots    DVT, in 20s, none since   History of chicken pox    History of diverticulitis    History of pericarditis 1986   with hospitalization   History of pneumonia 2014   History of shingles    History of UTI    Hyperlipidemia    Hypertension    Hypothyroidism    Mixed connective tissue disease (HCC)    Partial small bowel obstruction (HCC) 12/2019   managed conservatively   Peptic ulcer    Pneumonia    PONV (postoperative nausea and vomiting)    Raynaud's disease without gangrene    Shoulder pain left   h/o RTC tendonitis and adhesive capsulitis   Sigmoid diverticulitis 05/26/2021   Sjogren's syndrome (HCC)    Sleep apnea    prior PCP - no CPAP for about 10 yrs   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HCC)    Vitamin D  deficiency    prior PCP    Assessment: Patient Reported Symptoms:  Cognitive Cognitive Status: Able to follow simple commands, Alert and oriented to person,  place, and time      Neurological Neurological Review of Symptoms: No symptoms reported    HEENT HEENT Symptoms Reported: No symptoms reported      Cardiovascular Other Cardiovascular Symptoms: abdominal swelling Cardiovascular Conditions:  (CHF)  Respiratory Respiratory Symptoms Reported: No symptoms reported    Endocrine Patient reports the following symptoms related to hypoglycemia or hyperglycemia : Not assessed Is patient diabetic?: Yes Is patient checking blood sugars at home?: Yes    Gastrointestinal Gastrointestinal Symptoms Reported: No symptoms reported      Genitourinary Genitourinary Symptoms Reported: No symptoms reported    Integumentary Integumentary Symptoms Reported: No symptoms reported    Musculoskeletal Musculoskelatal Symptoms Reviewed: No symptoms reported        Psychosocial Psychosocial Symptoms Reported: No symptoms reported     Do you feel physically threatened by others?: No      01/29/2024    2:59 PM  Depression screen PHQ 2/9  Decreased Interest 0  Down, Depressed, Hopeless 0  PHQ - 2 Score 0  Altered sleeping 0  Tired, decreased energy 0  Change in appetite 0  Feeling bad or failure about yourself  0  Trouble concentrating 0  Moving slowly or fidgety/restless 0  Suicidal  thoughts 0  PHQ-9 Score 0  Difficult doing work/chores Not difficult at all    There were no vitals filed for this visit.  Medications Reviewed Today     Reviewed by Fletcher Humble, LCSW (Social Worker) on 02/06/24 at 1749  Med List Status: <None>   Medication Order Taking? Sig Documenting Provider Last Dose Status Informant  acetaminophen  (TYLENOL ) 500 MG tablet [id number] No Take 1,000 mg by mouth daily. Claire Crick, MD Taking Active Self, Pharmacy Records           Med Note [facility], JOYCE   Thu Nov 30, 2023 10:25 PM) prn  albuterol  (VENTOLIN  HFA) 108 (90 Base) MCG/ACT inhaler [id number] No Inhale 2 puffs into the lungs every 6 (six) hours as needed.  Marc Senior, MD Taking Active   Ascorbic Acid  (VITAMIN C ) 100 MG tablet [id number] No Take 100 mg by mouth daily. [provider] Taking Active Self, Pharmacy Records  aspirin  EC 81 MG tablet [id number] No Take 1 tablet (81 mg total) by mouth daily. Swallow whole. Claire Crick, MD Taking Active Self, Pharmacy Records  Blood Glucose Monitoring Suppl DEVI [id number] No 1 each by Does not apply route in the morning, at noon, and at bedtime. E13.319. May substitute to any manufacturer covered by patient's insurance. Claire Crick, MD Taking Active Self  carbamazepine (CARBATROL) 100 MG 12 hr capsule [id number]  Take 1 capsule (100 mg total) by mouth 2 (two) times daily. For trigeminal neuralgia Claire Crick, MD  Active   Cholecalciferol  (VITAMIN D3) 25 MCG (1000 UT) CAPS [id number] No Take 2 capsules (2,000 Units total) by mouth daily. Claire Crick, MD Taking Active Self, Pharmacy Records  clopidogrel  (PLAVIX ) 75 MG tablet [id number] No TAKE 1 TABLET(75 MG) BY MOUTH EVERY EVENING Young Hensen, MD Taking Active Self  diclofenac  Sodium (VOLTAREN ) 1 % GEL [id number] No Apply 2 g topically 3 (three) times daily. Claire Crick, MD Taking Active Self, Pharmacy Records  donepezil  (ARICEPT ) 10 MG tablet [id number] No Take 1 tablet (10 mg total) by mouth at bedtime. For memory Claire Crick, MD Taking Active   Fluticasone-Umeclidin-Vilant (TRELEGY ELLIPTA ) 100-62.5-25 MCG/ACT AEPB [id number] No Inhale 1 puff into the lungs daily. Marc Senior, MD Taking Active   Glucagon , rDNA, (GLUCAGON  EMERGENCY) 1 MG KIT [id number] No INJECT into THE muscle ONCE AS NEEDED FOR emergency  Patient not taking: Reported on 01/29/2024   Emilie Harden, MD Not Taking Active Self, Pharmacy Records           Med Note [facility] Jun 20, 2023 10:31 AM) Has not needed in a long time  glucose blood [facility] ULTRA) test strip [id number] No USE THREE TIMES DAILY Claire Crick, MD Taking Active   insulin  degludec (TRESIBA  FLEXTOUCH) 100 UNIT/ML FlexTouch Pen [id number] No Inject 55 Units into the skin at bedtime. Claire Crick, MD Taking Active   insulin  lispro (HUMALOG ) 100 UNIT/ML injection [id number] No Inject 0.05 mLs (5 Units total) into the skin 3 (three) times daily before meals. Claire Crick, MD Taking Active   isosorbide  mononitrate (IMDUR ) 30 MG 24 hr tablet [id number] No Take 0.5 tablets (15 mg total) by mouth daily. Claire Crick, MD Taking Active   losartan  (COZAAR ) 50 MG tablet [id number] No Take 0.5 tablets (25 mg total) by mouth daily. Jodeane Mulligan, DO Taking Active   metoprolol  succinate (TOPROL -XL) 25 MG 24 hr tablet [id number] No Take 0.5 tablets (12.5 mg total) by mouth daily. Claire Crick, MD Taking Active  Multiple Vitamin (MULTIVITAMIN ADULT) TABS [id number] No Take 1 tablet by mouth in the morning and at bedtime. Claire Crick, MD Taking Active Self, Pharmacy Records  nitroGLYCERIN  (NITROSTAT ) 0.4 MG SL tablet [id number] No Place 1 tablet (0.4 mg total) under the tongue every 5 (five) minutes as needed for chest pain. Sammy Crisp, MD Taking Expired 12/02/23 2359 Self, Pharmacy Records  rosuvastatin  (CRESTOR ) 20 MG tablet [id number] No Take 1 tablet (20 mg total) by mouth every evening. Claire Crick, MD Taking Active Self  SYNTHROID  75 MCG tablet [id number] No TAKE ONE TABLET BY MOUTH Monday-Saturday BEFORE breakfast Claire Crick, MD Taking Active Self  topiramate  (TOPAMAX ) 25 MG tablet [id number] No Take 1 tablet (25 mg total) by mouth at bedtime. For headache prevention Claire Crick, MD Taking Active   torsemide  (DEMADEX ) 20 MG tablet [id number] No Take 1 tablet (20 mg total) by mouth daily. Jodeane Mulligan, DO Taking Active             Recommendation:   Encouraged pt to contact [employer] to set up transportation  Follow Up Plan:   Telephone follow up appointment date/time:   02/27/2024  Fletcher Humble MSW, LCSW Licensed Clinical Social Worker  [facility], Population Health Direct Dial: [phone]  Fax: [phone]

## 2024-02-07 ENCOUNTER — Encounter: Admitting: Psychology

## 2024-02-09 ENCOUNTER — Encounter: Admitting: Psychology

## 2024-02-23 ENCOUNTER — Other Ambulatory Visit: Payer: Self-pay

## 2024-02-23 NOTE — Patient Instructions (Signed)
 Visit Information  Thank you for taking time to visit with me today. Please don't hesitate to contact me if I can be of assistance to you before our next scheduled appointment.  Our next appointment is by telephone on 03/06/24 at 1 PM Please call the care guide team at [phone] if you need to cancel or reschedule your appointment.   Following is a copy of your care plan:   Goals Addressed             This Visit's Progress    VBCI RN Care Plan related to CHF   On track    Problems:  Chronic Disease Management support and education needs related to CHF  Goal: Over the next 30 days the Patient will demonstrate a decrease CHF in exacerbations as evidenced by decreased report of symptoms related to CHF such as swelling and shortness of breath demonstrate Improved adherence to prescribed treatment plan for CHF as evidenced by patient report of stable weight take all medications exactly as prescribed and will call provider for medication related questions as evidenced by patient report of medication adherence     Interventions:   Heart Failure Interventions: Assessed need for readable accurate scales in home Advised patient to weigh each morning after emptying bladder Discussed importance of daily weight and advised patient to weigh and record daily Reviewed role of diuretics in prevention of fluid overload and management of heart failure; Discussed the importance of keeping all appointments with provider Provided patient with education about the role of exercise in the management of heart failure Screening for signs and symptoms of depression related to chronic disease state  Assessed social determinant of health barriers   Patient Self-Care Activities:  Attend all scheduled provider appointments Call provider office for new concerns or questions  Perform all self care activities independently  Take medications as prescribed   call office if I gain more than 2 pounds in one day  or 5 pounds in one week watch for swelling in feet, ankles and legs every day weigh myself daily  Plan:  Telephone follow up appointment with care management team member scheduled for:  03/06/24 at 1 PM with Michele Ahle          VBCI RN Care Plan related to cognitive impairment   On track    Problems:  Chronic Disease Management support and education needs related to mild cognitive impairment  Goal: Over the next 30 days the Patient will attend all scheduled medical appointments: neuropyschology as evidenced by completed visit notes/tests uploaded to EMR        take all medications exactly as prescribed and will call provider for medication related questions as evidenced by patient report of medication adherence     Interventions:   Evaluation of current treatment plan related to mild cognitive impairment,  self-management and patient's adherence to plan as established by provider. Discussed plans with patient for ongoing care management follow up and provided patient with direct contact information for care management team Evaluation of current treatment plan related to mild cognitive impairment and patient's adherence to plan as established by provider Provided education to patient re: coping strategies for memory impairment Reviewed medications with patient and discussed the importance of medication compliance Reviewed scheduled/upcoming provider appointments including neuropsychology 02/28/24  Discussed plans with patient for ongoing care management follow up and provided patient with direct contact information for care management team  Patient Self-Care Activities:  Attend all scheduled provider appointments Call provider office for new  concerns or questions  Take medications as prescribed    Plan:  Telephone follow up appointment with care management team member scheduled for:  03/06/24 at 1 PM with Michele Ahle             Please call the Suicide and Crisis Lifeline:  988 call 1-800-273-TALK (toll free, 24 hour hotline) if you are experiencing a Mental Health or Behavioral Health Crisis or need someone to talk to.  Patient verbalizes understanding of instructions and care plan provided today and agrees to view in MyChart. Active MyChart status and patient understanding of how to access instructions and care plan via MyChart confirmed with patient.     Theodora Fish, RN MSN [place]  VBCI Population Health RN Care Manager Direct Dial: [phone]  Fax: [phone]

## 2024-02-23 NOTE — Patient Outreach (Signed)
 Complex Care Management   Visit Note  02/23/2024  Name:  Cassandra Benson MRN: [id number] DOB: [birth year]  Situation: Referral received for Complex Care Management related to Heart Failure and mild cognitive impairment I obtained verbal consent from Patient.  Visit completed with Loistine Rinne  on the phone  Background:   Past Medical History:  Diagnosis Date   Acute diverticulitis 04/2021   [facility] ER, CT confirmed   Allergy    ANA positive    positive ANA pattern 1 speckled   Arthritis    Carotid stenosis, asymptomatic 06/19/2015   1-39% RICA 40-59% LICA rpt 1 yr (05/2015)    CHF (congestive heart failure) (HCC)    Colon polyps    COVID-19 virus infection 09/14/2021   Dermatomyositis (HCC)    Diabetes mellitus without complication (HCC)    Type 1   Diverticulosis    sigmoid on CT scan 12/2019   Family history of adverse reaction to anesthesia    brothr went into cardiac arrest from anectine   Fibromyalgia    prior PCP   GERD (gastroesophageal reflux disease)    prior PCP   Glaucoma    Narrow angle   History of blood clots    DVT, in 20s, none since   History of chicken pox    History of diverticulitis    History of pericarditis 1986   with hospitalization   History of pneumonia 2014   History of shingles    History of UTI    Hyperlipidemia    Hypertension    Hypothyroidism    Mixed connective tissue disease (HCC)    Partial small bowel obstruction (HCC) 12/2019   managed conservatively   Peptic ulcer    Pneumonia    PONV (postoperative nausea and vomiting)    Raynaud's disease without gangrene    Shoulder pain left   h/o RTC tendonitis and adhesive capsulitis   Sigmoid diverticulitis 05/26/2021   Sjogren's syndrome (HCC)    Sleep apnea    prior PCP - no CPAP for about 10 yrs   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HCC)    Vitamin D  deficiency    prior PCP    Assessment: Patient Reported Symptoms:  Cognitive Cognitive Status: Able to follow simple commands, Alert  and oriented to person, place, and time, Normal speech and language skills, Struggling with memory recall Cognitive/Intellectual Conditions Management [RPT]: None reported or documented in medical history or problem list      Neurological Neurological Review of Symptoms: Other: [place] Neurological Symptoms/Conditions [RPT]: Struggling with memory recall Neurological Conditions:  (Mild cognitive impairment per chart review; trigeminal neuralgia) Neurological Management Strategies: Medication therapy, Coping strategies Neurological Comment: Patient is scheduled for cognitive testing, next follow-up scheduled for 02/28/24  HEENT HEENT Symptoms Reported: No symptoms reported HEENT Conditions: Vision problem(s) Vision Problems: blindness/vision loss (Macular degeneration) HEENT Management Strategies: Routine screening HEENT Comment: Patient reports she missed her last eye appointment due to being in the hospital. She plans to call to reschedule. Vision problem(s)  Cardiovascular Cardiovascular Symptoms Reported: Swelling in legs or feet ("a littel bit puffy" L ankle) Does patient have uncontrolled Hypertension?: No Cardiovascular Conditions: Heart failure, Hypertension, High blood cholesterol Cardiovascular Management Strategies: Medication therapy, Weight management, Diet modification, Exercise Do You Have a Working Readable Scale?: Yes Weight: 145 lb (65.8 kg) (patient reported)  Respiratory Respiratory Symptoms Reported: Shortness of breath (Shortness of breath when walking, resolves with a few minutes of rest. At baseline per patient.) Additional Respiratory Details: Chronic dry cough Respiratory  Conditions: Asthma, Sleep disordered breathing (Does not wear CPAP)  Endocrine Patient reports the following symptoms related to hypoglycemia or hyperglycemia : No symptoms reported Is patient diabetic?: Yes Is patient checking blood sugars at home?: Yes Endocrine Conditions: Diabetes, Thyroid   disorder Endocrine Management Strategies: Medication therapy, Diet modification, Exercise  Gastrointestinal Gastrointestinal Symptoms Reported: No symptoms reported (Last BM today)   Nutrition Risk Screen (CP): No indicators present  Genitourinary Genitourinary Symptoms Reported: No symptoms reported    Integumentary Integumentary Symptoms Reported: No symptoms reported    Musculoskeletal Musculoskelatal Symptoms Reviewed: Difficulty walking Musculoskeletal Conditions: Osteopenia Musculoskeletal Management Strategies: Medical device, Adequate rest, Exercise Falls in the past year?: No Number of falls in past year: 1 or less Was there an injury with Fall?: No Fall Risk Category Calculator: 0 Patient Fall Risk Level: Low Fall Risk Patient at Risk for Falls Due to: Impaired balance/gait Fall risk Follow up: Falls evaluation completed, Education provided, Falls prevention discussed  Psychosocial Psychosocial Symptoms Reported: No symptoms reported     Quality of Family Relationships: helpful, involved, supportive Do you feel physically threatened by others?: No      02/23/2024    2:00 PM  Depression screen PHQ 2/9  Decreased Interest 0  Down, Depressed, Hopeless 0  PHQ - 2 Score 0    Vitals:   02/23/24 1337  BP: 100/60  Pulse: 92    Medications Reviewed Today     Reviewed by Valaria Garland, RN (Registered Nurse) on 02/23/24 at 1321  Med List Status: <None>   Medication Order Taking? Sig Documenting Provider Last Dose Status Informant  acetaminophen  (TYLENOL ) 500 MG tablet [id number]  Take 1,000 mg by mouth daily. Claire Crick, MD  Active Self, Pharmacy Records           Med Note [facility], JOYCE   Thu Nov 30, 2023 10:25 PM) prn  albuterol  (VENTOLIN  HFA) 108 (90 Base) MCG/ACT inhaler [id number]  Inhale 2 puffs into the lungs every 6 (six) hours as needed. Marc Senior, MD  Active   Ascorbic Acid  (VITAMIN C ) 100 MG tablet [id number]  Take 100 mg by mouth daily.  [provider]  Active Self, Pharmacy Records  aspirin  EC 81 MG tablet [id number]  Take 1 tablet (81 mg total) by mouth daily. Swallow whole. Claire Crick, MD  Active Self, Pharmacy Records  Blood Glucose Monitoring Suppl DEVI [id number] Yes 1 each by Does not apply route in the morning, at noon, and at bedtime. E13.319. May substitute to any manufacturer covered by patient's insurance. Claire Crick, MD Taking Active Self  carbamazepine (CARBATROL) 100 MG 12 hr capsule [id number]  Take 1 capsule (100 mg total) by mouth 2 (two) times daily. For trigeminal neuralgia Claire Crick, MD  Active   Cholecalciferol  (VITAMIN D3) 25 MCG (1000 UT) CAPS [id number]  Take 2 capsules (2,000 Units total) by mouth daily. Claire Crick, MD  Active Self, Pharmacy Records  clopidogrel  (PLAVIX ) 75 MG tablet [id number]  TAKE 1 TABLET(75 MG) BY MOUTH EVERY EVENING Young Hensen, MD  Active Self  diclofenac  Sodium (VOLTAREN ) 1 % GEL [id number]  Apply 2 g topically 3 (three) times daily. Claire Crick, MD  Active Self, Pharmacy Records  donepezil  (ARICEPT ) 10 MG tablet [id number]  Take 1 tablet (10 mg total) by mouth at bedtime. For memory Claire Crick, MD  Active   Fluticasone-Umeclidin-Vilant (TRELEGY ELLIPTA ) 100-62.5-25 MCG/ACT AEPB [id number]  Inhale 1 puff into the lungs daily. Marc Senior, MD  Active   Glucagon , rDNA, (  GLUCAGON  EMERGENCY) 1 MG KIT [id number]  INJECT into THE muscle ONCE AS NEEDED FOR emergency  Patient not taking: Reported on 01/29/2024   Emilie Harden, MD  Active Self, Pharmacy Records           Med Note Madelynn Schilder   Tue Jun 20, 2023 10:31 AM) Has not needed in a long time  glucose blood [facility] ULTRA) test strip [id number] Yes USE THREE TIMES DAILY Claire Crick, MD Taking Active   insulin  degludec (TRESIBA  FLEXTOUCH) 100 UNIT/ML FlexTouch Pen [id number]  Inject 55 Units into the skin at bedtime. Claire Crick, MD  Active   insulin  lispro  (HUMALOG ) 100 UNIT/ML injection [id number]  Inject 0.05 mLs (5 Units total) into the skin 3 (three) times daily before meals. Claire Crick, MD  Active   isosorbide  mononitrate (IMDUR ) 30 MG 24 hr tablet [id number]  Take 0.5 tablets (15 mg total) by mouth daily. Claire Crick, MD  Active   losartan  (COZAAR ) 50 MG tablet [id number]  Take 0.5 tablets (25 mg total) by mouth daily. Jodeane Mulligan, DO  Active   metoprolol  succinate (TOPROL -XL) 25 MG 24 hr tablet [id number]  Take 0.5 tablets (12.5 mg total) by mouth daily. Claire Crick, MD  Active   Multiple Vitamin (MULTIVITAMIN ADULT) TABS [id number]  Take 1 tablet by mouth in the morning and at bedtime. Claire Crick, MD  Active Self, Pharmacy Records  nitroGLYCERIN  (NITROSTAT ) 0.4 MG SL tablet [id number]  Place 1 tablet (0.4 mg total) under the tongue every 5 (five) minutes as needed for chest pain. Sammy Crisp, MD  Expired 12/02/23 2359 Self, Pharmacy Records  rosuvastatin  (CRESTOR ) 20 MG tablet [id number]  Take 1 tablet (20 mg total) by mouth every evening. Claire Crick, MD  Active Self  SYNTHROID  75 MCG tablet [id number]  TAKE ONE TABLET BY MOUTH Monday-Saturday BEFORE breakfast Claire Crick, MD  Active Self  topiramate  (TOPAMAX ) 25 MG tablet [id number]  Take 1 tablet (25 mg total) by mouth at bedtime. For headache prevention Claire Crick, MD  Active   torsemide  (DEMADEX ) 20 MG tablet [id number]  Take 1 tablet (20 mg total) by mouth daily. Jodeane Mulligan, DO  Active             Recommendation:   Specialty provider follow-up neuropyschology as scheduled Continue Current Plan of Care  Follow Up Plan:   Telephone follow up appointment date/time:  03/06/24 at 1 PM with [facility], RN MSN [place]  [facility] RN Care Manager Direct Dial: [phone]  Fax: [phone]

## 2024-02-27 ENCOUNTER — Other Ambulatory Visit: Payer: Self-pay | Admitting: Licensed Clinical Social Worker

## 2024-02-27 NOTE — Patient Instructions (Signed)

## 2024-02-27 NOTE — Patient Outreach (Signed)
 Complex Care Management   Visit Note  02/27/2024  Name:  Cassandra Benson MRN: [id number] DOB: 12/20/1940  Situation: Referral received for Complex Care Management related to SDOH Barriers:  Transportation I obtained verbal consent from Patient.  Visit completed with F Moretto  on the phone. Pt request information for transportation for future use. Currently patient is still driving.   Background:   Past Medical History:  Diagnosis Date   Acute diverticulitis 04/2021   [facility] ER, CT confirmed   Allergy    ANA positive    positive ANA pattern 1 speckled   Arthritis    Carotid stenosis, asymptomatic 06/19/2015   1-39% RICA 40-59% LICA rpt 1 yr (05/2015)    CHF (congestive heart failure) (HCC)    Colon polyps    COVID-19 virus infection 09/14/2021   Dermatomyositis (HCC)    Diabetes mellitus without complication (HCC)    Type 1   Diverticulosis    sigmoid on CT scan 12/2019   Family history of adverse reaction to anesthesia    brothr went into cardiac arrest from anectine   Fibromyalgia    prior PCP   GERD (gastroesophageal reflux disease)    prior PCP   Glaucoma    Narrow angle   History of blood clots    DVT, in 20s, none since   History of chicken pox    History of diverticulitis    History of pericarditis 1986   with hospitalization   History of pneumonia 2014   History of shingles    History of UTI    Hyperlipidemia    Hypertension    Hypothyroidism    Mixed connective tissue disease (HCC)    Partial small bowel obstruction (HCC) 12/2019   managed conservatively   Peptic ulcer    Pneumonia    PONV (postoperative nausea and vomiting)    Raynaud's disease without gangrene    Shoulder pain left   h/o RTC tendonitis and adhesive capsulitis   Sigmoid diverticulitis 05/26/2021   Sjogren's syndrome (HCC)    Sleep apnea    prior PCP - no CPAP for about 10 yrs   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HCC)    Vitamin D  deficiency    prior PCP    Assessment: Patient Reported  Symptoms:  Cognitive Cognitive Status: Able to follow simple commands, Alert and oriented to person, place, and time, Normal speech and language skills Cognitive/Intellectual Conditions Management [RPT]: None reported or documented in medical history or problem list      Neurological Neurological Review of Symptoms: Not assessed    HEENT HEENT Symptoms Reported: No symptoms reported      Cardiovascular Cardiovascular Symptoms Reported: No symptoms reported Does patient have uncontrolled Hypertension?: No Cardiovascular Conditions: Heart failure, High blood cholesterol, Hypertension Cardiovascular Management Strategies: Weight management, Medication therapy Do You Have a Working Readable Scale?: Yes  Respiratory Respiratory Symptoms Reported: Shortness of breath    Endocrine Patient reports the following symptoms related to hypoglycemia or hyperglycemia : No symptoms reported Is patient diabetic?: Yes Is patient checking blood sugars at home?: Yes Endocrine Conditions: Diabetes Endocrine Management Strategies: Adequate rest, Diet modification  Gastrointestinal Gastrointestinal Symptoms Reported: No symptoms reported      Genitourinary Genitourinary Symptoms Reported: No symptoms reported    Integumentary Integumentary Symptoms Reported: No symptoms reported    Musculoskeletal Musculoskelatal Symptoms Reviewed: Difficulty walking Musculoskeletal Conditions: Osteopenia Musculoskeletal Management Strategies: Medical device, Adequate rest, Exercise Falls in the past year?: No Number of falls in past year: 1 or  less Was there an injury with Fall?: No Fall Risk Category Calculator: 0 Patient Fall Risk Level: Low Fall Risk    Psychosocial       Quality of Family Relationships: helpful, involved Do you feel physically threatened by others?: No      02/23/2024    2:00 PM  Depression screen PHQ 2/9  Decreased Interest 0  Down, Depressed, Hopeless 0  PHQ - 2 Score 0    There  were no vitals filed for this visit.  Medications Reviewed Today     Reviewed by Fletcher Humble, LCSW (Social Worker) on 02/27/24 at 1015  Med List Status: <None>   Medication Order Taking? Sig Documenting Provider Last Dose Status Informant  acetaminophen  (TYLENOL ) 500 MG tablet [id number] No Take 1,000 mg by mouth daily. Claire Crick, MD Taking Active Self, Pharmacy Records           Med Note [facility], JOYCE   Thu Nov 30, 2023 10:25 PM) prn  albuterol  (VENTOLIN  HFA) 108 [phone] Base) MCG/ACT inhaler [id number] No Inhale 2 puffs into the lungs every 6 (six) hours as needed. Marc Senior, MD Taking Active   Ascorbic Acid  (VITAMIN C ) 100 MG tablet [id number] No Take 100 mg by mouth daily. [provider] Taking Active Self, Pharmacy Records  aspirin  EC 81 MG tablet [id number] No Take 1 tablet (81 mg total) by mouth daily. Swallow whole. Claire Crick, MD Taking Active Self, Pharmacy Records  Blood Glucose Monitoring Suppl DEVI [id number] No 1 each by Does not apply route in the morning, at noon, and at bedtime. E13.319. May substitute to any manufacturer covered by patient's insurance. Claire Crick, MD Taking Active Self  carbamazepine (CARBATROL) 100 MG 12 hr capsule [id number] No Take 1 capsule (100 mg total) by mouth 2 (two) times daily. For trigeminal neuralgia Claire Crick, MD Taking Active            Med Note (STEFFENS, MICHELLE P   Fri Feb 23, 2024  1:23 PM) Patient reports taking as needed  Cholecalciferol  (VITAMIN D3) 25 MCG (1000 UT) CAPS [id number] No Take 2 capsules (2,000 Units total) by mouth daily. Claire Crick, MD Taking Active Self, Pharmacy Records  clopidogrel  (PLAVIX ) 75 MG tablet [id number] No TAKE 1 TABLET(75 MG) BY MOUTH EVERY EVENING Young Hensen, MD Taking Active Self  diclofenac  Sodium (VOLTAREN ) 1 % GEL [id number] No Apply 2 g topically 3 (three) times daily.  Patient not taking: Reported on 02/23/2024   Claire Crick, MD Not Taking  Active Self, Pharmacy Records  donepezil  (ARICEPT ) 10 MG tablet [id number] No Take 1 tablet (10 mg total) by mouth at bedtime. For memory Claire Crick, MD Taking Active   Fluticasone-Umeclidin-Vilant (TRELEGY ELLIPTA ) 100-62.5-25 MCG/ACT AEPB [id number] No Inhale 1 puff into the lungs daily. Marc Senior, MD Taking Active   Glucagon , rDNA, (GLUCAGON  EMERGENCY) 1 MG KIT [id number] No INJECT into THE muscle ONCE AS NEEDED FOR emergency  Patient not taking: No sig reported   Emilie Harden, MD Not Taking Active Self, Pharmacy Records           Med Note Purcell Bruce Jun 20, 2023 10:31 AM) Has not needed in a long time  glucose blood [facility] ULTRA) test strip [id number] No USE THREE TIMES DAILY Claire Crick, MD Taking Active   insulin  degludec (TRESIBA  FLEXTOUCH) 100 UNIT/ML FlexTouch Pen [id number] No Inject 55 Units into the skin at bedtime. Claire Crick, MD Taking Active   insulin  lispro (HUMALOG ) 100  UNIT/ML injection [id number] No Inject 0.05 mLs (5 Units total) into the skin 3 (three) times daily before meals. Claire Crick, MD Taking Active   isosorbide  mononitrate (IMDUR ) 30 MG 24 hr tablet [id number] No Take 0.5 tablets (15 mg total) by mouth daily. Claire Crick, MD Taking Active   losartan  (COZAAR ) 50 MG tablet [id number] No Take 0.5 tablets (25 mg total) by mouth daily. Jodeane Mulligan, DO Taking Active   metoprolol  succinate (TOPROL -XL) 25 MG 24 hr tablet [id number] No Take 0.5 tablets (12.5 mg total) by mouth daily. Claire Crick, MD Taking Active   Multiple Vitamin (MULTIVITAMIN ADULT) TABS [id number] No Take 1 tablet by mouth in the morning and at bedtime. Claire Crick, MD Taking Active Self, Pharmacy Records  Multiple Vitamins-Minerals (PRESERVISION AREDS PO) [id number] No Take by mouth. [provider] Taking Active Self  nitroGLYCERIN  (NITROSTAT ) 0.4 MG SL tablet [id number] No Place 1 tablet (0.4 mg total) under the tongue every 5  (five) minutes as needed for chest pain. Sammy Crisp, MD Taking Expired 12/02/23 2359 Self, Pharmacy Records  rosuvastatin  (CRESTOR ) 20 MG tablet [id number] No Take 1 tablet (20 mg total) by mouth every evening. Claire Crick, MD Taking Active Self  SYNTHROID  75 MCG tablet [id number] No TAKE ONE TABLET BY MOUTH Monday-Saturday BEFORE breakfast Claire Crick, MD Taking Active Self  topiramate  (TOPAMAX ) 25 MG tablet [id number] No Take 1 tablet (25 mg total) by mouth at bedtime. For headache prevention Claire Crick, MD Taking Active   torsemide  (DEMADEX ) 20 MG tablet [id number] No Take 1 tablet (20 mg total) by mouth daily. Jodeane Mulligan, DO Taking Active             Recommendation:   Continue Current Plan of Care LCSW A Khoi Hamberger advised pt [employer] offers assistance with medical appt transportation. Pt encouraged to contact [facility] when needed. At current moment patient is currently driving to her medical appts.   Follow Up Plan:   Patient has met all care management goals. Care Management case will be closed. Patient has been provided contact information should new needs arise.   Fletcher Humble MSW, LCSW Licensed Clinical Social Worker  [facility], Population Health Direct Dial: [phone]  Fax: [phone]

## 2024-02-28 ENCOUNTER — Encounter

## 2024-02-28 ENCOUNTER — Telehealth: Payer: Self-pay

## 2024-02-28 NOTE — Telephone Encounter (Signed)
 Called patient due to no show for neuropsych testing appt (1:00pm 02/28/2024) following one previous cancellation in May. Pt expressed uncertainty surrounding testing and stated that she did not have a ride to [facility] for her appts. Informed pt about the purpose of testing and advised patient to reach out to her insurance company to see if they can arrange for transportation.

## 2024-03-01 ENCOUNTER — Telehealth: Payer: Self-pay

## 2024-03-01 ENCOUNTER — Encounter: Attending: Psychology

## 2024-03-01 DIAGNOSIS — R413 Other amnesia: Secondary | ICD-10-CM | POA: Insufficient documentation

## 2024-03-01 NOTE — Telephone Encounter (Signed)
 Copied from CRM [id number]. Topic: Clinical - Medical Advice >> Mar 01, 2024  8:52 AM Dewanda Foots wrote: Reason for CRM: [place] states she missed a call from the PCP yesterday and talked to Neuro today and wanted have the PCP call her back directly if possible please. She wants to talk to him about the neuro and only trusts his judgement on it. Please call her at [phone] (Cell) or [phone] (home) and will be home all day waiting on this call and expresses apologies for missing his call yesterday.

## 2024-03-01 NOTE — Telephone Encounter (Addendum)
 Spoke with patient. Do recommend she complete neurocognitive testing. She agrees.  Do recommend she find someone to drive her to these appointments. She will contact her neighbor who drove her to first appt to ask about her availability.

## 2024-03-01 NOTE — Telephone Encounter (Signed)
 Called patient re: no show for neuropsych testing appt on 03/01/24 (resch from no show on 6/4). Pt stated that she was unaware of appt and did not have transportation. She expressed uncertainty about testing despite conversation on 6/4 w/ myself (psychometrician) along with neuropsychologist (Dr. Aleene Hurry) clarifying the purpose of testing. Pt stated that she would like to f/u with referring provider before rescheduling. Interpretation and feedback appointments have been canceled until testing can be rescheduled.

## 2024-03-04 NOTE — Telephone Encounter (Addendum)
 Spoke with patient. Again encouraged she keep upcoming scheduled appts for neurocognitive evaluation.  She notes she has found that [employer] will cover medical transportation.  She is looking forward to upcoming high school reunion in August

## 2024-03-04 NOTE — Telephone Encounter (Unsigned)
 Copied from CRM [id number]. Topic: Clinical - Medical Advice >> Mar 01, 2024  8:52 AM Dewanda Foots wrote: Reason for CRM: Cassandra Benson states she missed a call from the PCP yesterday and talked to Neuro today and wanted have the PCP call her back directly if possible please. She wants to talk to him about the neuro and only trusts his judgement on it. Please call her at [phone] (Cell) or [phone] (home) and will be home all day waiting on this call and expresses apologies for missing his call yesterday. >> Mar 04, 2024  4:47 PM Cassandra Benson wrote: Patient has to go in for another medical issue and she would like to ask if Dr Cassandra Benson suggest she does "there has been a request she does a 4 hour intake at the next place in [place] and she has a hard time with 4 hours worth of questions on her next visit and she values Dr Cassandra Benson opinion on this matter and would like to ask if he could call her back about the matter" and she says only at Dr Cassandra Benson convenience only  Pt call numbers  [phone] (M)  [phone] (H)

## 2024-03-05 ENCOUNTER — Encounter: Admitting: Psychology

## 2024-03-05 ENCOUNTER — Ambulatory Visit: Admitting: Family Medicine

## 2024-03-05 ENCOUNTER — Telehealth: Payer: Self-pay | Admitting: Psychology

## 2024-03-05 NOTE — Telephone Encounter (Signed)
 Patient called in requesting to confirm appointment, informed patient she is scheduled 6/17 , patient is frustrated because she doesn't know why the test is going to take 4 hours. Informed patient that we have to hold it for 4 hours but it might not take the whole time. Patient states she is confused because she had the previously scheduled appointments and thought she had to come in today and 6/13. Informed patient due to no show 6/6 those appointments have been canceled and we have her scheduled 6/17.

## 2024-03-06 ENCOUNTER — Other Ambulatory Visit: Payer: Self-pay

## 2024-03-06 ENCOUNTER — Encounter: Payer: Self-pay | Admitting: *Deleted

## 2024-03-06 ENCOUNTER — Other Ambulatory Visit: Payer: Self-pay | Admitting: *Deleted

## 2024-03-08 ENCOUNTER — Emergency Department

## 2024-03-08 ENCOUNTER — Observation Stay
Admission: EM | Admit: 2024-03-08 | Discharge: 2024-03-09 | Disposition: A | Discharge status: Home / Self Care | Attending: Internal Medicine | Admitting: Internal Medicine

## 2024-03-08 ENCOUNTER — Other Ambulatory Visit: Payer: Self-pay

## 2024-03-08 ENCOUNTER — Observation Stay

## 2024-03-08 ENCOUNTER — Encounter: Payer: Self-pay | Admitting: Emergency Medicine

## 2024-03-08 ENCOUNTER — Telehealth: Payer: Self-pay | Admitting: Family Medicine

## 2024-03-08 ENCOUNTER — Encounter: Admitting: Psychology

## 2024-03-08 ENCOUNTER — Telehealth: Payer: Self-pay

## 2024-03-08 DIAGNOSIS — Z7982 Long term (current) use of aspirin: Secondary | ICD-10-CM | POA: Diagnosis not present

## 2024-03-08 DIAGNOSIS — R079 Chest pain, unspecified: Secondary | ICD-10-CM | POA: Diagnosis not present

## 2024-03-08 DIAGNOSIS — I110 Hypertensive heart disease with heart failure: Secondary | ICD-10-CM | POA: Insufficient documentation

## 2024-03-08 DIAGNOSIS — E1065 Type 1 diabetes mellitus with hyperglycemia: Secondary | ICD-10-CM | POA: Diagnosis not present

## 2024-03-08 DIAGNOSIS — Z794 Long term (current) use of insulin: Secondary | ICD-10-CM | POA: Insufficient documentation

## 2024-03-08 DIAGNOSIS — I2510 Atherosclerotic heart disease of native coronary artery without angina pectoris: Secondary | ICD-10-CM | POA: Diagnosis not present

## 2024-03-08 DIAGNOSIS — M940 Chondrocostal junction syndrome [Tietze]: Secondary | ICD-10-CM | POA: Diagnosis not present

## 2024-03-08 DIAGNOSIS — R000 Tachycardia, unspecified: Secondary | ICD-10-CM | POA: Diagnosis not present

## 2024-03-08 DIAGNOSIS — G8929 Other chronic pain: Secondary | ICD-10-CM | POA: Diagnosis present

## 2024-03-08 DIAGNOSIS — R112 Nausea with vomiting, unspecified: Secondary | ICD-10-CM | POA: Diagnosis not present

## 2024-03-08 DIAGNOSIS — E039 Hypothyroidism, unspecified: Secondary | ICD-10-CM | POA: Insufficient documentation

## 2024-03-08 DIAGNOSIS — I739 Peripheral vascular disease, unspecified: Secondary | ICD-10-CM | POA: Diagnosis not present

## 2024-03-08 DIAGNOSIS — R0789 Other chest pain: Secondary | ICD-10-CM | POA: Diagnosis not present

## 2024-03-08 DIAGNOSIS — R0602 Shortness of breath: Secondary | ICD-10-CM | POA: Diagnosis not present

## 2024-03-08 DIAGNOSIS — I5030 Unspecified diastolic (congestive) heart failure: Secondary | ICD-10-CM | POA: Insufficient documentation

## 2024-03-08 DIAGNOSIS — I700 Atherosclerosis of aorta: Secondary | ICD-10-CM | POA: Diagnosis not present

## 2024-03-08 LAB — COMPREHENSIVE METABOLIC PANEL WITH GFR
ALT: 15 U/L (ref 0–44)
AST: 24 U/L (ref 15–41)
Albumin: 3.5 g/dL (ref 3.5–5.0)
Alkaline Phosphatase: 81 U/L (ref 38–126)
Anion gap: 10 (ref 5–15)
BUN: 13 mg/dL (ref 8–23)
CO2: 24 mmol/L (ref 22–32)
Calcium: 9 mg/dL (ref 8.9–10.3)
Chloride: 102 mmol/L (ref 98–111)
Creatinine, Ser: 0.76 mg/dL (ref 0.44–1.00)
GFR, Estimated: >60 mL/min (ref >60)
Glucose, Bld: 245 mg/dL — ABNORMAL HIGH (ref 70–99)
Potassium: 3.3 mmol/L — ABNORMAL LOW (ref 3.5–5.1)
Sodium: 136 mmol/L (ref 135–145)
Total Bilirubin: 0.7 mg/dL (ref 0.0–1.2)
Total Protein: 6.4 g/dL — ABNORMAL LOW (ref 6.5–8.1)

## 2024-03-08 LAB — CBC
HCT: 40.1 % (ref 36.0–46.0)
Hemoglobin: 13.1 g/dL (ref 12.0–15.0)
MCH: 27.5 pg (ref 26.0–34.0)
MCHC: 32.7 g/dL (ref 30.0–36.0)
MCV: 84.1 fL (ref 80.0–100.0)
Platelets: 219 10*3/uL (ref 150–400)
RBC: 4.77 MIL/uL (ref 3.87–5.11)
RDW: 13.4 % (ref 11.5–15.5)
WBC: 11.7 10*3/uL — ABNORMAL HIGH (ref 4.0–10.5)
nRBC: 0 % (ref 0.0–0.2)

## 2024-03-08 LAB — MAGNESIUM: Magnesium: 2 mg/dL (ref 1.7–2.4)

## 2024-03-08 LAB — GLUCOSE, CAPILLARY
Glucose-Capillary: 194 mg/dL — ABNORMAL HIGH (ref 70–99)
Glucose-Capillary: 293 mg/dL — ABNORMAL HIGH (ref 70–99)
Glucose-Capillary: 477 mg/dL — ABNORMAL HIGH (ref 70–99)

## 2024-03-08 LAB — CBG MONITORING, ED: Glucose-Capillary: 188 mg/dL — ABNORMAL HIGH (ref 70–99)

## 2024-03-08 LAB — TROPONIN I (HIGH SENSITIVITY): Troponin I (High Sensitivity): 5 ng/L (ref <18)

## 2024-03-08 LAB — D-DIMER, QUANTITATIVE: D-Dimer, Quant: 0.83 ug{FEU}/mL — ABNORMAL HIGH (ref 0.00–0.50)

## 2024-03-08 LAB — GLUCOSE, RANDOM: Glucose, Bld: 438 mg/dL — ABNORMAL HIGH (ref 70–99)

## 2024-03-08 MED ORDER — ASPIRIN 81 MG PO TBEC
81.0000 mg | DELAYED_RELEASE_TABLET | Freq: Every day | ORAL | Status: DC
  Administered 2024-03-08 – 2024-03-09 (×2): 81 mg via ORAL
  Filled 2024-03-08 (×2): qty 1

## 2024-03-08 MED ORDER — INSULIN GLARGINE-YFGN 100 UNIT/ML ~~LOC~~ SOLN
30.0000 [IU] | Freq: Every day | SUBCUTANEOUS | Status: DC
  Administered 2024-03-08: 30 [IU] via SUBCUTANEOUS
  Filled 2024-03-08 (×2): qty 0.3

## 2024-03-08 MED ORDER — LOSARTAN POTASSIUM 50 MG PO TABS
25.0000 mg | ORAL_TABLET | Freq: Every day | ORAL | Status: DC
  Administered 2024-03-08 – 2024-03-09 (×2): 25 mg via ORAL
  Filled 2024-03-08 (×2): qty 1

## 2024-03-08 MED ORDER — IOHEXOL 350 MG/ML SOLN
75.0000 mL | Freq: Once | INTRAVENOUS | Status: AC | PRN
Start: 2024-03-08 — End: 2024-03-08
  Administered 2024-03-08: 75 mL via INTRAVENOUS

## 2024-03-08 MED ORDER — TORSEMIDE 20 MG PO TABS
20.0000 mg | ORAL_TABLET | Freq: Every day | ORAL | Status: DC
  Administered 2024-03-08 – 2024-03-09 (×2): 20 mg via ORAL
  Filled 2024-03-08 (×2): qty 1

## 2024-03-08 MED ORDER — INSULIN ASPART 100 UNIT/ML IJ SOLN: 0.0000 [IU] | Freq: Every day | INTRAMUSCULAR | Status: DC

## 2024-03-08 MED ORDER — INSULIN ASPART 100 UNIT/ML IJ SOLN
0.0000 [IU] | Freq: Three times a day (TID) | INTRAMUSCULAR | Status: DC
  Administered 2024-03-08: 8 [IU] via SUBCUTANEOUS
  Administered 2024-03-08: 3 [IU] via SUBCUTANEOUS
  Administered 2024-03-09: 2 [IU] via SUBCUTANEOUS
  Filled 2024-03-08 (×3): qty 1

## 2024-03-08 MED ORDER — LIDOCAINE 5 % EX PTCH
1.0000 | MEDICATED_PATCH | CUTANEOUS | Status: DC
  Administered 2024-03-08 – 2024-03-09 (×2): 1 via TRANSDERMAL
  Filled 2024-03-08 (×2): qty 1

## 2024-03-08 MED ORDER — ACETAMINOPHEN 325 MG PO TABS
650.0000 mg | ORAL_TABLET | ORAL | Status: DC | PRN
  Administered 2024-03-09 (×2): 650 mg via ORAL
  Filled 2024-03-08 (×2): qty 2

## 2024-03-08 MED ORDER — LEVOTHYROXINE SODIUM 50 MCG PO TABS
75.0000 ug | ORAL_TABLET | Freq: Every day | ORAL | Status: DC
  Administered 2024-03-08 – 2024-03-09 (×2): 75 ug via ORAL
  Filled 2024-03-08: qty 1
  Filled 2024-03-08: qty 2

## 2024-03-08 MED ORDER — DIPHENHYDRAMINE HCL 50 MG/ML IJ SOLN
50.0000 mg | Freq: Once | INTRAMUSCULAR | Status: AC
Start: 2024-03-08 — End: 2024-03-08
  Administered 2024-03-08: 50 mg via INTRAVENOUS
  Filled 2024-03-08: qty 1

## 2024-03-08 MED ORDER — ENOXAPARIN SODIUM 40 MG/0.4ML IJ SOSY
40.0000 mg | PREFILLED_SYRINGE | INTRAMUSCULAR | Status: DC
  Administered 2024-03-08: 40 mg via SUBCUTANEOUS
  Filled 2024-03-08: qty 0.4

## 2024-03-08 MED ORDER — NITROGLYCERIN 0.4 MG SL SUBL: 0.4000 mg | SUBLINGUAL_TABLET | SUBLINGUAL | Status: DC | PRN

## 2024-03-08 MED ORDER — ONDANSETRON HCL 4 MG/2ML IJ SOLN
4.0000 mg | Freq: Once | INTRAMUSCULAR | Status: AC
  Administered 2024-03-08: 4 mg via INTRAVENOUS
  Filled 2024-03-08: qty 2

## 2024-03-08 MED ORDER — METHYLPREDNISOLONE SODIUM SUCC 40 MG IJ SOLR
40.0000 mg | Freq: Once | INTRAMUSCULAR | Status: AC
Start: 2024-03-08 — End: 2024-03-08
  Administered 2024-03-08: 40 mg via INTRAVENOUS
  Filled 2024-03-08: qty 1

## 2024-03-08 MED ORDER — BUDESON-GLYCOPYRROL-FORMOTEROL 160-9-4.8 MCG/ACT IN AERO
2.0000 | INHALATION_SPRAY | Freq: Two times a day (BID) | RESPIRATORY_TRACT | Status: DC
  Administered 2024-03-08 – 2024-03-09 (×2): 2 via RESPIRATORY_TRACT
  Filled 2024-03-08: qty 5.9

## 2024-03-08 MED ORDER — DONEPEZIL HCL 5 MG PO TABS
10.0000 mg | ORAL_TABLET | Freq: Every day | ORAL | Status: DC
  Administered 2024-03-08: 10 mg via ORAL
  Filled 2024-03-08: qty 2

## 2024-03-08 MED ORDER — INSULIN DEGLUDEC 100 UNIT/ML ~~LOC~~ SOPN: 30.0000 [IU] | PEN_INJECTOR | Freq: Every day | SUBCUTANEOUS | Status: DC

## 2024-03-08 MED ORDER — ISOSORBIDE MONONITRATE ER 30 MG PO TB24
15.0000 mg | ORAL_TABLET | Freq: Every day | ORAL | Status: DC
  Administered 2024-03-08 – 2024-03-09 (×2): 15 mg via ORAL
  Filled 2024-03-08 (×3): qty 1

## 2024-03-08 MED ORDER — INSULIN ASPART 100 UNIT/ML IJ SOLN
20.0000 [IU] | Freq: Once | INTRAMUSCULAR | Status: AC
  Administered 2024-03-08: 20 [IU] via SUBCUTANEOUS
  Filled 2024-03-08: qty 1

## 2024-03-08 MED ORDER — ALBUTEROL SULFATE (2.5 MG/3ML) 0.083% IN NEBU: 2.5000 mg | INHALATION_SOLUTION | Freq: Four times a day (QID) | RESPIRATORY_TRACT | Status: DC | PRN

## 2024-03-08 MED ORDER — TOPIRAMATE 25 MG PO TABS
25.0000 mg | ORAL_TABLET | Freq: Every day | ORAL | Status: DC
  Administered 2024-03-08: 25 mg via ORAL
  Filled 2024-03-08: qty 1

## 2024-03-08 MED ORDER — ROSUVASTATIN CALCIUM 10 MG PO TABS
20.0000 mg | ORAL_TABLET | Freq: Every evening | ORAL | Status: DC
  Administered 2024-03-08: 20 mg via ORAL
  Filled 2024-03-08: qty 1
  Filled 2024-03-08: qty 2

## 2024-03-08 MED ORDER — ONDANSETRON HCL 4 MG/2ML IJ SOLN: 4.0000 mg | Freq: Four times a day (QID) | INTRAMUSCULAR | Status: DC | PRN

## 2024-03-08 MED ORDER — POTASSIUM CHLORIDE CRYS ER 20 MEQ PO TBCR
40.0000 meq | EXTENDED_RELEASE_TABLET | Freq: Once | ORAL | Status: AC
  Administered 2024-03-08: 40 meq via ORAL
  Filled 2024-03-08: qty 2

## 2024-03-08 MED ORDER — LOPERAMIDE HCL 2 MG PO CAPS: 2.0000 mg | ORAL_CAPSULE | ORAL | Status: DC | PRN

## 2024-03-08 MED ORDER — CLOPIDOGREL BISULFATE 75 MG PO TABS
75.0000 mg | ORAL_TABLET | Freq: Every day | ORAL | Status: DC
  Administered 2024-03-08 – 2024-03-09 (×2): 75 mg via ORAL
  Filled 2024-03-08 (×2): qty 1

## 2024-03-08 MED ORDER — METOPROLOL SUCCINATE ER 25 MG PO TB24
12.5000 mg | ORAL_TABLET | Freq: Every day | ORAL | Status: DC
  Administered 2024-03-08 – 2024-03-09 (×2): 12.5 mg via ORAL
  Filled 2024-03-08 (×2): qty 1

## 2024-03-08 NOTE — H&P (Signed)
 History and Physical    Odelia Graciano ZOX:[id number] DOB: 1941/07/18 DOA: 03/08/2024  PCP: Claire Crick, MD (Confirm with patient/family/NH records and if not entered, this has to be entered at [facility] point of entry) Patient coming from: Home  I have personally briefly reviewed patient's old medical records in [facility]  Chief Complaint: Chest pain  HPI: Cloey Sferrazza is a 83 y.o. female with medical history significant of nonobstructive single-vessel CAD, HTN, HLD, IDDM, PVD, hypothyroidism, presented with chest pain.  Patient has a history of single-vessel CAD with nonobstructive LAD lesion less than 50% stenosis on the recent LHC study in March 2024, and she also has a history of intermittent chest pains, has been followed by cardiology, who considered etiology related to coronary artery spasm and patient was started on Imdur  and as needed nitroglycerin .  Patient reported that she has been having intermittent chest pains, usually less than 1 time a week, happens more toward late night to early morning, for which she has been taking as needed nitroglycerin  with full release.  Last night  has been very sick for after self cooked dinner, patient started to have intractable nauseous vomiting and diarrhea the whole night.  She described the vomitus as stomach content and clear liquid stuff no blood no bile.  Diarrhea has been loose  so many episodes denied any fever chills or abdominal pain.  Last diarrhea was between 6 and 7 AM this morning.  Then around 6:00, she woke up with severe chest pain  all across chest from left to right pressure-like, radiating to right shoulder, worsening with deep breath and upper torso movement.  She took 4 nitroglycerin  and chest pain subsided in about 30-40 minutes.  Denied any associated symptoms of sweating shortness of breath lightheadedness.  ED Course: Afebrile, no tachycardia no hypotension not hypoxic.  D-dimer elevated.  Chest  x-ray negative for acute findings troponin negative x 2 EKG showed no acute ST changes.  Review of Systems: As per HPI otherwise 14 point review of systems negative.    Past Medical History:  Diagnosis Date   Acute diverticulitis 04/2021   [facility] ER, CT confirmed   Allergy    ANA positive    positive ANA pattern 1 speckled   Arthritis    Carotid stenosis, asymptomatic 06/19/2015   1-39% RICA 40-59% LICA rpt 1 yr (05/2015)    CHF (congestive heart failure) (HCC)    Colon polyps    COVID-19 virus infection 09/14/2021   Dermatomyositis (HCC)    Diabetes mellitus without complication (HCC)    Type 1   Diverticulosis    sigmoid on CT scan 12/2019   Family history of adverse reaction to anesthesia    brothr went into cardiac arrest from anectine   Fibromyalgia    prior PCP   GERD (gastroesophageal reflux disease)    prior PCP   Glaucoma    Narrow angle   History of blood clots    DVT, in 20s, none since   History of chicken pox    History of diverticulitis    History of pericarditis 1986   with hospitalization   History of pneumonia 2014   History of shingles    History of UTI    Hyperlipidemia    Hypertension    Hypothyroidism    Mixed connective tissue disease (HCC)    Partial small bowel obstruction (HCC) 12/2019   managed conservatively   Peptic ulcer    Pneumonia    PONV (  postoperative nausea and vomiting)    Raynaud's disease without gangrene    Shoulder pain left   h/o RTC tendonitis and adhesive capsulitis   Sigmoid diverticulitis 05/26/2021   Sjogren's syndrome (HCC)    Sleep apnea    prior PCP - no CPAP for about 10 yrs   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HCC)    Vitamin D  deficiency    prior PCP    Past Surgical History:  Procedure Laterality Date   ABDOMINAL HYSTERECTOMY  1978   fibroids and menorrhagia, ovaries remain   ARTERY BIOPSY Right 04/06/2018   Procedure: BIOPSY TEMPORAL ARTERY RIGHT;  Surgeon: Lesly Raspberry, MD;  Location: [facility];   Service: ENT;  Laterality: Right;  Diabetic - insulin  pump sleep apnea   CARDIAC CATHETERIZATION  2000   [facility] normal per patient   COLONOSCOPY  10/2011   1 TA, 1 HP, very tortuous colon ([place])   COLONOSCOPY  02/2020   TA, inflammatory polyp, mod diverticulosis, int/ext hemorrhoids ([place]) no rpt recommended    COLONOSCOPY WITH ESOPHAGOGASTRODUODENOSCOPY (EGD)  03/2007   2 ulcers, benign polyp, rpt 5 yrs [facility])   PARTIAL HIP ARTHROPLASTY Right 2013   hip replacement   RIGHT HEART CATH AND CORONARY ANGIOGRAPHY Bilateral 12/02/2022   Procedure: RIGHT HEART CATH AND CORONARY ANGIOGRAPHY;  Surgeon: Sammy Crisp, MD;  Location: [facility] INVASIVE CV LAB;  Service: Cardiovascular;  Laterality: Bilateral;   TONSILLECTOMY     TONSILLECTOMY AND ADENOIDECTOMY     TRANSCAROTID ARTERY REVASCULARIZATION  Left 07/23/2018   Procedure: TRANSCAROTID ARTERY REVASCULARIZATION;  Surgeon: Young Hensen, MD;  Location: [facility] OR;  Service: Vascular;  Laterality: Left;   TUBAL LIGATION     VAGINAL DELIVERY     x2, no complications     reports that she has never smoked. She has never used smokeless tobacco. She reports that she does not drink alcohol and does not use drugs.  Allergies  Allergen Reactions   Iodinated Contrast Media Other (See Comments)    Itching (severe) and chest tightness, Rash   Penicillins Anaphylaxis, Swelling, Rash and Other (See Comments)    Has patient had a PCN reaction causing immediate rash, facial/tongue/throat swelling, SOB or lightheadedness with hypotension: Yes Has patient had a PCN reaction causing severe rash involving mucus membranes or skin necrosis: yes - remotely Has patient had a PCN reaction that required hospitalization: occurred while hospitalized Has patient had a PCN reaction occurring within the last 10 years: No If all of the above answers are NO, then may proceed with Cephalosporin use.    Amlodipine  Swelling    Pedal edema    Anectine [Succinylcholine] Other (See Comments)    Brother went into cardiac arrest.   Carvedilol  Dermatitis    Facial acne   Codeine Nausea Only   Gabapentin  Other (See Comments)    Gait abnormality   Influenza Vaccines Other (See Comments)    Muscle weakness; unable to walk   Insulin  Aspart (Human Analog) Other (See Comments)    headache   Nortriptyline  Other (See Comments)    Eye swelling and mouth drawed up   Prednisone      Marked severe hyperglycemia to oral and IM steroids   Valsartan Other (See Comments) and Cough    Allergy to generic only, Hacking cough   Zetia  [Ezetimibe ] Other (See Comments)    Bad muscle cramps   Erythromycin Rash and Swelling   Sulfa Antibiotics Rash    Family History  Problem Relation Age of Onset  CAD Mother 16       MI, aortic valve issues   COPD Mother    Lupus Mother    Murrell Arrant' disease Mother    Rheum arthritis Mother    CAD Father 53       CABG x2, aortic valve replacement   Stroke Sister    CAD Sister    Anuerysm Sister        brain   Lupus Sister    Diabetes Sister    Diabetes Sister    Breast cancer Sister    Alcohol abuse Brother    CAD Brother 4       MI   Stroke Brother    COPD Brother        agent orange   CAD Brother 60       stent   Diabetes Brother    Stroke Maternal Grandmother    Hypertension Maternal Grandmother    Gallbladder disease Maternal Grandmother    Breast cancer Maternal Aunt    Breast cancer Maternal Aunt    Depression Grandchild    Colon cancer Neg Hx    Esophageal cancer Neg Hx    Rectal cancer Neg Hx    Stomach cancer Neg Hx     Prior to Admission medications   Medication Sig Start Date End Date Taking? Authorizing Provider  acetaminophen  (TYLENOL ) 500 MG tablet Take 1,000 mg by mouth daily. 01/03/20   Claire Crick, MD  albuterol  (VENTOLIN  HFA) 108 [phone] Base) MCG/ACT inhaler Inhale 2 puffs into the lungs every 6 (six) hours as needed. 01/10/24   Marc Senior, MD  Ascorbic  Acid (VITAMIN C ) 100 MG tablet Take 100 mg by mouth daily.    [provider]  aspirin  EC 81 MG tablet Take 1 tablet (81 mg total) by mouth daily. Swallow whole. 05/04/22   Claire Crick, MD  Blood Glucose Monitoring Suppl DEVI 1 each by Does not apply route in the morning, at noon, and at bedtime. E13.319. May substitute to any manufacturer covered by patient's insurance. 11/11/23   Gutierrez, Javier, MD  carbamazepine (CARBATROL) 100 MG 12 hr capsule Take 1 capsule (100 mg total) by mouth 2 (two) times daily. For trigeminal neuralgia 01/29/24   Claire Crick, MD  Cholecalciferol  (VITAMIN D3) 25 MCG (1000 UT) CAPS Take 2 capsules (2,000 Units total) by mouth daily. 11/04/22   Claire Crick, MD  clopidogrel  (PLAVIX ) 75 MG tablet TAKE 1 TABLET(75 MG) BY MOUTH EVERY EVENING 11/07/23   Young Hensen, MD  diclofenac  Sodium (VOLTAREN ) 1 % GEL Apply 2 g topically 3 (three) times daily. Patient not taking: Reported on 02/23/2024 06/22/22   Claire Crick, MD  donepezil  (ARICEPT ) 10 MG tablet Take 1 tablet (10 mg total) by mouth at bedtime. For memory 01/19/24   Claire Crick, MD  Fluticasone-Umeclidin-Vilant (TRELEGY ELLIPTA ) 100-62.5-25 MCG/ACT AEPB Inhale 1 puff into the lungs daily. 01/10/24   Marc Senior, MD  Glucagon , rDNA, (GLUCAGON  EMERGENCY) 1 MG KIT INJECT into THE muscle ONCE AS NEEDED FOR emergency Patient not taking: No sig reported 11/18/21   Emilie Harden, MD  glucose blood (ONETOUCH ULTRA) test strip USE THREE TIMES DAILY 01/01/24   Claire Crick, MD  insulin  degludec (TRESIBA  FLEXTOUCH) 100 UNIT/ML FlexTouch Pen Inject 55 Units into the skin at bedtime. 01/05/24   Claire Crick, MD  insulin  lispro (HUMALOG ) 100 UNIT/ML injection Inject 5 Units into the skin 3 (three) times daily before meals. 12/13/23   Claire Crick, MD  isosorbide   mononitrate (IMDUR ) 30 MG 24 hr tablet Take 0.5 tablets (15 mg total) by mouth daily. 01/05/24 01/04/25  Claire Crick,  MD  losartan  (COZAAR ) 50 MG tablet Take 0.5 tablets (25 mg total) by mouth daily. 12/01/23   Jodeane Mulligan, DO  metoprolol  succinate (TOPROL -XL) 25 MG 24 hr tablet Take 0.5 tablets (12.5 mg total) by mouth daily. 01/05/24   Claire Crick, MD  Multiple Vitamin (MULTIVITAMIN ADULT) TABS Take 1 tablet by mouth in the morning and at bedtime. 01/18/22   Claire Crick, MD  Multiple Vitamins-Minerals (PRESERVISION AREDS PO) Take by mouth.    [provider]  nitroGLYCERIN  (NITROSTAT ) 0.4 MG SL tablet Place 1 tablet (0.4 mg total) under the tongue every 5 (five) minutes as needed for chest pain. 12/02/22 12/02/23  End, Veryl Gottron, MD  rosuvastatin  (CRESTOR ) 20 MG tablet Take 1 tablet (20 mg total) by mouth every evening. 11/11/23   Claire Crick, MD  SYNTHROID  75 MCG tablet TAKE ONE TABLET BY MOUTH Monday-Saturday BEFORE breakfast 11/11/23   Claire Crick, MD  topiramate  (TOPAMAX ) 25 MG tablet Take 1 tablet (25 mg total) by mouth at bedtime. For headache prevention 01/05/24   Claire Crick, MD  torsemide  (DEMADEX ) 20 MG tablet Take 1 tablet (20 mg total) by mouth daily. 12/01/23   Jodeane Mulligan, DO    Physical Exam: Vitals:   03/08/24 0800  BP: 123/66  Pulse: 63  Resp: 15  Temp: 98 F (36.7 C)  TempSrc: Oral  SpO2: 98%    Constitutional: NAD, calm, comfortable Vitals:   03/08/24 0800  BP: 123/66  Pulse: 63  Resp: 15  Temp: 98 F (36.7 C)  TempSrc: Oral  SpO2: 98%   Eyes: PERRL, lids and conjunctivae normal ENMT: Mucous membranes are moist. Posterior pharynx clear of any exudate or lesions.Normal dentition.  Neck: normal, supple, no masses, no thyromegaly Respiratory: clear to auscultation bilaterally, no wheezing, no crackles. Normal respiratory effort. No accessory muscle use.  Cardiovascular: Regular rate and rhythm, no murmurs / rubs / gallops. No extremity edema. 2+ pedal pulses. No carotid bruits.  Tenderness on chest wall on palpiaitons Abdomen: no  tenderness, no masses palpated. No hepatosplenomegaly. Bowel sounds positive.  Musculoskeletal: no clubbing / cyanosis. No joint deformity upper and lower extremities. Good ROM, no contractures. Normal muscle tone.  Skin: no rashes, lesions, ulcers. No induration Neurologic: CN 2-12 grossly intact. Sensation intact, DTR normal. Strength 5/5 in all 4.  Psychiatric: Normal judgment and insight. Alert and oriented x 3. Normal mood.    Labs on Admission: I have personally reviewed following labs and imaging studies  CBC: Recent Labs  Lab 03/08/24 0810  WBC 11.7*  HGB 13.1  HCT 40.1  MCV 84.1  PLT 219   Basic Metabolic Panel: Recent Labs  Lab 03/08/24 0810  NA 136  K 3.3*  CL 102  CO2 24  GLUCOSE 245*  BUN 13  CREATININE 0.76  CALCIUM  9.0   GFR: Estimated Creatinine Clearance: 50 mL/min (by C-G formula based on SCr of 0.76 mg/dL). Liver Function Tests: Recent Labs  Lab 03/08/24 0810  AST 24  ALT 15  ALKPHOS 81  BILITOT 0.7  PROT 6.4*  ALBUMIN  3.5   No results for input(s): LIPASE, AMYLASE in the last 168 hours. No results for input(s): AMMONIA in the last 168 hours. Coagulation Profile: No results for input(s): INR, PROTIME in the last 168 hours. Cardiac Enzymes: No results for input(s): CKTOTAL, CKMB, CKMBINDEX, TROPONINI in the last 168 hours. BNP (  last 3 results) No results for input(s): PROBNP in the last 8760 hours. HbA1C: No results for input(s): HGBA1C in the last 72 hours. CBG: No results for input(s): GLUCAP in the last 168 hours. Lipid Profile: No results for input(s): CHOL, HDL, LDLCALC, TRIG, CHOLHDL, LDLDIRECT in the last 72 hours. Thyroid  Function Tests: No results for input(s): TSH, T4TOTAL, FREET4, T3FREE, THYROIDAB in the last 72 hours. Anemia Panel: No results for input(s): VITAMINB12, FOLATE, FERRITIN, TIBC, IRON, RETICCTPCT in the last 72 hours. Urine analysis:    Component Value  Date/Time   COLORURINE COLORLESS (A) 05/10/2022 1000   APPEARANCEUR CLEAR (A) 05/10/2022 1000   LABSPEC 1.014 05/10/2022 1000   PHURINE 6.0 05/10/2022 1000   GLUCOSEU >=500 (A) 05/10/2022 1000   HGBUR NEGATIVE 05/10/2022 1000   BILIRUBINUR NEGATIVE 05/10/2022 1000   BILIRUBINUR negative 02/25/2022 1553   KETONESUR NEGATIVE 05/10/2022 1000   PROTEINUR NEGATIVE 05/10/2022 1000   UROBILINOGEN 0.2 02/25/2022 1553   NITRITE NEGATIVE 05/10/2022 1000   LEUKOCYTESUR NEGATIVE 05/10/2022 1000    Radiological Exams on Admission: DG Chest Port 1 View Result Date: 03/08/2024 CLINICAL DATA:  Chest pain, shortness of breath. EXAM: PORTABLE CHEST 1 VIEW COMPARISON:  November 30, 2023. FINDINGS: The heart size and mediastinal contours are within normal limits. Both lungs are clear. The visualized skeletal structures are unremarkable. IMPRESSION: No active disease. Electronically Signed   By: Rosalene Colon M.D.   On: 03/08/2024 08:25    EKG: Independently reviewed.  Sinus rhythm, no acute ST changes.  Assessment/Plan Principal Problem:   Chest pain  (please populate well all problems here in Problem List. (For example, if patient is on BP meds at home and you resume or decide to hold them, it is a problem that needs to be her. Same for CAD, COPD, HLD and so on)  Atypical chest pain - ACS rule out.  Etiology likely musculoskeletal/costochondritis. Trial of lidocaine  patch - Other DDx, coronary artery spasm also likely given strong history.  Continue Imdur  and as needed nitroglycerin .  Recommend outpatient follow-up with cardiology for possible stress test.  Recent LHC results reviewed. -Continue to trend troponins, echocardiogram was done on last admission 2 months ago, will not repeat at this point - D-dimer elevated, CTA is pending to rule out PE.  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

## 2024-03-08 NOTE — Telephone Encounter (Unsigned)
 Copied from CRM [id number]. Topic: General - Other >> Mar 08, 2024 10:13 AM Tyronne Galloway wrote: Reason for CRM: Pt wanted Dr. Viva Grise to be aware she was admitted to [facility] and will do a CAT scan. Pt stated she has been sick all night and needed help and called the emergency services for an ambulance.

## 2024-03-08 NOTE — ED Provider Notes (Signed)
 [facility] Provider Note    Event Date/Time   First MD Initiated Contact with Patient 03/08/24 [phone]     (approximate)   History   Chest Pain   HPI  Cassandra Benson is a 83 y.o. female with a history of reported CHF who reports chronic mild shortness of breath who presents with complaints of right-sided chest pain with new pleurisy which developed this morning.  Generally felt well last night.  Also reports nausea today which is atypical for her.  Took nitroglycerin  at home with little improvement.  Review of records demonstrates reassuring echo March 7, right heart cath March 8 overall reassuring as well     Physical Exam   Triage Vital Signs: ED Triage Vitals  Encounter Vitals Group     BP 03/08/24 0800 123/66     Girls Systolic BP Percentile --      Girls Diastolic BP Percentile --      Boys Systolic BP Percentile --      Boys Diastolic BP Percentile --      Pulse Rate 03/08/24 0800 63     Resp 03/08/24 0800 15     Temp 03/08/24 0800 98 F (36.7 C)     Temp Source 03/08/24 0800 Oral     SpO2 03/08/24 0800 98 %     Weight --      Height --      Head Circumference --      Peak Flow --      Pain Score 03/08/24 0759 8     Pain Loc --      Pain Education --      Exclude from Growth Chart --     Most recent vital signs: Vitals:   03/08/24 0800  BP: 123/66  Pulse: 63  Resp: 15  Temp: 98 F (36.7 C)  SpO2: 98%     General: Awake, no distress.  CV:  Good peripheral perfusion.  Resp:  Normal effort.  Abd:  No distention.  Other:  No lower extremity edema or swelling   ED Results / Procedures / Treatments   Labs (all labs ordered are listed, but only abnormal results are displayed) Labs Reviewed  CBC - Abnormal; Notable for the following components:      Result Value   WBC 11.7 (*)    All other components within normal limits  COMPREHENSIVE METABOLIC PANEL WITH GFR - Abnormal; Notable for the following components:    Potassium 3.3 (*)    Glucose, Bld 245 (*)    Total Protein 6.4 (*)    All other components within normal limits  D-DIMER, QUANTITATIVE - Abnormal; Notable for the following components:   D-Dimer, Quant 0.83 (*)    All other components within normal limits  TROPONIN I (HIGH SENSITIVITY)     EKG  ED ECG REPORT I, Bryson Carbine, the attending physician, personally viewed and interpreted this ECG.  Date: 03/08/2024  Rhythm: normal sinus rhythm QRS Axis: normal Intervals: normal ST/T Wave abnormalities: normal Narrative Interpretation: no evidence of acute ischemia    RADIOLOGY Chest x-ray without evidence of pneumonia    PROCEDURES:  Critical Care performed:   Procedures   MEDICATIONS ORDERED IN ED: Medications  methylPREDNISolone  sodium succinate  (SOLU-MEDROL ) 40 mg/mL injection 40 mg (has no administration in time range)  diphenhydrAMINE  (BENADRYL ) injection 50 mg (has no administration in time range)  ondansetron  (ZOFRAN ) injection 4 mg (4 mg Intravenous Given 03/08/24 0846)     IMPRESSION /  MDM / ASSESSMENT AND PLAN / ED COURSE  I reviewed the triage vital signs and the nursing notes. Patient's presentation is most consistent with acute presentation with potential threat to life or bodily function.  Patient presents with chest pain as detailed above, she reports it radiates to her right back.  She reports pleurisy.  Differential includes PE, ACS, pneumonia, pleurisy  EKG is reassuring, pending high sensitive troponin, x-ray  Initial high sensitive troponin is normal, D-dimer is elevated.  Patient has continued chest discomfort.  I suspect she will need admission for further evaluation of chest pain, will pretreat and order CT angiography       FINAL CLINICAL IMPRESSION(S) / ED DIAGNOSES   Final diagnoses:  Chest pain, unspecified type     Rx / DC Orders   ED Discharge Orders     None        Note:  This document was prepared using Dragon  voice recognition software and may include unintentional dictation errors.   Bryson Carbine, MD 03/08/24 1023

## 2024-03-08 NOTE — ED Notes (Addendum)
 Fall risk measures taken. Non-slip socks, fall risk bracelet applied to pt. Bed alarm on. Pt requesting nausea medication. RN & MD notified.

## 2024-03-08 NOTE — Plan of Care (Signed)
  Problem: Health Behavior/Discharge Planning: Goal: Ability to identify and utilize available resources and services will improve Outcome: Progressing Goal: Ability to manage health-related needs will improve Outcome: Progressing   Problem: Metabolic: Goal: Ability to maintain appropriate glucose levels will improve Outcome: Progressing   Problem: Tissue Perfusion: Goal: Adequacy of tissue perfusion will improve Outcome: Progressing

## 2024-03-08 NOTE — Telephone Encounter (Signed)
 Copied from CRM [id number]. Topic: General - Other >> Mar 08, 2024 10:20 AM Alyse July wrote: Reason for CRM: Patient admitted to [facility] hospital today. 03/08/24. Patient wasn't clear on admission reason.

## 2024-03-08 NOTE — ED Triage Notes (Signed)
 Pt BIB [facility] from home with reports of sudden onset right sided chest pain that radiates to her mid back. Reports the pain woke her up from sleep. Also reports pain when taking a deep breath.   Pt has taken 4 nitroglycerin  and 324 ASA PTA.

## 2024-03-08 NOTE — Telephone Encounter (Signed)
 Noted. Admitted for atypical chest pain, will await their evaluation.

## 2024-03-09 DIAGNOSIS — R079 Chest pain, unspecified: Secondary | ICD-10-CM | POA: Diagnosis not present

## 2024-03-09 DIAGNOSIS — M940 Chondrocostal junction syndrome [Tietze]: Secondary | ICD-10-CM | POA: Diagnosis not present

## 2024-03-09 LAB — BASIC METABOLIC PANEL WITH GFR
Anion gap: 13 (ref 5–15)
BUN: 22 mg/dL (ref 8–23)
CO2: 23 mmol/L (ref 22–32)
Calcium: 9.6 mg/dL (ref 8.9–10.3)
Chloride: 103 mmol/L (ref 98–111)
Creatinine, Ser: 0.96 mg/dL (ref 0.44–1.00)
GFR, Estimated: 59 mL/min — ABNORMAL LOW (ref >60)
Glucose, Bld: 131 mg/dL — ABNORMAL HIGH (ref 70–99)
Potassium: 3.3 mmol/L — ABNORMAL LOW (ref 3.5–5.1)
Sodium: 139 mmol/L (ref 135–145)

## 2024-03-09 LAB — TROPONIN I (HIGH SENSITIVITY): Troponin I (High Sensitivity): 8 ng/L (ref <18)

## 2024-03-09 LAB — CBC
HCT: 39.6 % (ref 36.0–46.0)
Hemoglobin: 13.4 g/dL (ref 12.0–15.0)
MCH: 28.1 pg (ref 26.0–34.0)
MCHC: 33.8 g/dL (ref 30.0–36.0)
MCV: 83 fL (ref 80.0–100.0)
Platelets: 257 10*3/uL (ref 150–400)
RBC: 4.77 MIL/uL (ref 3.87–5.11)
RDW: 13.5 % (ref 11.5–15.5)
WBC: 17.2 10*3/uL — ABNORMAL HIGH (ref 4.0–10.5)
nRBC: 0 % (ref 0.0–0.2)

## 2024-03-09 LAB — GLUCOSE, CAPILLARY
Glucose-Capillary: 350 mg/dL — ABNORMAL HIGH (ref 70–99)
Glucose-Capillary: 255 mg/dL — ABNORMAL HIGH (ref 70–99)
Glucose-Capillary: 122 mg/dL — ABNORMAL HIGH (ref 70–99)

## 2024-03-09 MED ORDER — LIDOCAINE 5 % EX PTCH: 1.0000 | MEDICATED_PATCH | CUTANEOUS | 0 refills | Status: DC

## 2024-03-09 MED ORDER — POTASSIUM CHLORIDE 20 MEQ PO PACK
40.0000 meq | PACK | Freq: Once | ORAL | Status: AC
  Administered 2024-03-09: 40 meq via ORAL
  Filled 2024-03-09: qty 2

## 2024-03-09 MED ORDER — MELATONIN 5 MG PO TABS
5.0000 mg | ORAL_TABLET | Freq: Every evening | ORAL | Status: DC | PRN
  Administered 2024-03-09: 5 mg via ORAL
  Filled 2024-03-09: qty 1

## 2024-03-09 MED ORDER — KETOROLAC TROMETHAMINE 15 MG/ML IJ SOLN
15.0000 mg | Freq: Once | INTRAMUSCULAR | Status: AC
  Administered 2024-03-09: 15 mg via INTRAVENOUS
  Filled 2024-03-09: qty 1

## 2024-03-09 NOTE — Progress Notes (Signed)
 Patient ID: Cassandra Benson, female   DOB: 25-Feb-1941, 83 y.o.   MRN: [id number]   Upon entering room this morning for assessment and medications, patient stated that yesterday two females, not wearing badges or scrubs and very nicely dresses, came into her room and requested to see her vaginal area. Patient said she laid back and let them examine her but when she saw they were not wearing badges she asked their names and they refused to tell her and they left. Incident reported to charge nurse immediately who called the Administrative Coordinator. MD notified.  Cassandra Kendall, RN

## 2024-03-09 NOTE — Hospital Course (Addendum)
 Taken from H&P.  Cassandra Benson is a 83 y.o. female with medical history significant of nonobstructive single-vessel CAD, HTN, HLD, IDDM, PVD, hypothyroidism, presented with chest pain.  The patient has an history of recurrent intermittent chest pain, followed by cardiology, consider etiology related to coronary artery spasms and patient was started on Imdur  and as needed nitroglycerin .  A night before admission patient developed nausea, vomiting and diarrhea lasted the whole night, resolved next morning.  Later in the morning she developed chest pain.  No associated symptoms.  On presentation vital stable, labs with elevated D-dimer at 0.83.  Mild leukocytosis at 11.7 and mild hypokalemia.  Chest x-ray negative for acute abnormalities.  Troponin remained negative x 2 and EKG with no acute ST changes.  Pain seems reproducible.  6/14: Vital stable.  CTA was negative for any PE or other acute abnormality. Patient is having reproducible pain, likely costochondritis.  No shortness of breath or hypoxia.  Labs with mild hypokalemia and leukocytosis but she remained afebrile.  No urinary symptoms.  Potassium was repleted.  Patient with very strange behavior earlier she complained to nurse that somebody walked to her room and did a forceful vaginal exam.  When I saw her and tried explaining that it is not your heart likely an inflammation of your rib joints she got mad at me and keeps saying that I belong to a medically oriented family and had a family history of heart disease.  Despite multiple explanation she was very fixated on her chronic illnesses like diabetes and may be some lung or lupus issues in the past. She had a long list of allergies which are more like intolerances.  She was given a dose of Toradol and lidocaine  patches to help.  She can use Tylenol  or naproxen as needed for pain and need to have a close follow-up with her primary care provider.  She can follow-up with a rheumatologist if  needed.  She will continue the rest of her home medications and follow-up with her providers closely for further management.  Tried calling son and daughter listed in her chart with no response.

## 2024-03-09 NOTE — Discharge Summary (Signed)
 Physician Discharge Summary   Patient: Cassandra Benson MRN: [id number] DOB: 1941/03/03  Admit date:     03/08/2024  Discharge date: 03/09/24  Discharge Physician: Luna Salinas   PCP: Claire Crick, MD   Recommendations at discharge:  Please obtain CBC and BMP and follow-up Patient with some leukocytosis but no fever, can be reactive with concern of costochondritis as there are no other symptoms and imaging normal. Per patient she has an history of lupus-if persistence of symptoms she need to follow-up with her rheumatologist for further assistance. Follow-up with primary care provider within few days.  Discharge Diagnoses: Principal Problem:   Chest pain Active Problems:   Costochondritis   Hospital Course: Taken from H&P.  Cassandra Benson is a 83 y.o. female with medical history significant of nonobstructive single-vessel CAD, HTN, HLD, IDDM, PVD, hypothyroidism, presented with chest pain.  The patient has an history of recurrent intermittent chest pain, followed by cardiology, consider etiology related to coronary artery spasms and patient was started on Imdur  and as needed nitroglycerin .  A night before admission patient developed nausea, vomiting and diarrhea lasted the whole night, resolved next morning.  Later in the morning she developed chest pain.  No associated symptoms.  On presentation vital stable, labs with elevated D-dimer at 0.83.  Mild leukocytosis at 11.7 and mild hypokalemia.  Chest x-ray negative for acute abnormalities.  Troponin remained negative x 2 and EKG with no acute ST changes.  Pain seems reproducible.  6/14: Vital stable.  CTA was negative for any PE or other acute abnormality. Patient is having reproducible pain, likely costochondritis.  No shortness of breath or hypoxia.  Labs with mild hypokalemia and leukocytosis but she remained afebrile.  No urinary symptoms.  Potassium was repleted.  Patient with very strange behavior earlier she  complained to nurse that somebody walked to her room and did a forceful vaginal exam.  When I saw her and tried explaining that it is not your heart likely an inflammation of your rib joints she got mad at me and keeps saying that I belong to a medically oriented family and had a family history of heart disease.  Despite multiple explanation she was very fixated on her chronic illnesses like diabetes and may be some lung or lupus issues in the past. She had a long list of allergies which are more like intolerances.  She was given a dose of Toradol and lidocaine  patches to help.  She can use Tylenol  or naproxen as needed for pain and need to have a close follow-up with her primary care provider.  She can follow-up with a rheumatologist if needed.  She will continue the rest of her home medications and follow-up with her providers closely for further management.  Tried calling son and daughter listed in her chart with no response.  Consultants: None Procedures performed: None Disposition: Home Diet recommendation:  Discharge Diet Orders (From admission, onward)     Start     Ordered   03/09/24 0000  Diet - low sodium heart healthy        03/09/24 1045           Cardiac and Carb modified diet DISCHARGE MEDICATION: Allergies as of 03/09/2024       Reactions   Iodinated Contrast Media Other (See Comments)   Itching (severe) and chest tightness, Rash   Penicillins Anaphylaxis, Swelling, Rash, Other (See Comments)   Has patient had a PCN reaction causing immediate rash, facial/tongue/throat swelling, SOB or lightheadedness  with hypotension: Yes Has patient had a PCN reaction causing severe rash involving mucus membranes or skin necrosis: yes - remotely Has patient had a PCN reaction that required hospitalization: occurred while hospitalized Has patient had a PCN reaction occurring within the last 10 years: No If all of the above answers are NO, then may proceed with Cephalosporin use.    Amlodipine  Swelling   Pedal edema   Anectine [succinylcholine] Other (See Comments)   Brother went into cardiac arrest.   Carvedilol  Dermatitis   Facial acne   Codeine Nausea Only   Gabapentin  Other (See Comments)   Gait abnormality   Influenza Vaccines Other (See Comments)   Muscle weakness; unable to walk   Insulin  Aspart (human Analog) Other (See Comments)   headache   Nortriptyline  Other (See Comments)   Eye swelling and mouth drawed up   Prednisone     Marked severe hyperglycemia to oral and IM steroids   Valsartan Other (See Comments), Cough   Allergy to generic only, Hacking cough   Zetia  [ezetimibe ] Other (See Comments)   Bad muscle cramps   Erythromycin Rash, Swelling   Sulfa Antibiotics Rash        Medication List     TAKE these medications    acetaminophen  500 MG tablet Commonly known as: TYLENOL  Take 1,000 mg by mouth daily.   albuterol  108 (90 Base) MCG/ACT inhaler Commonly known as: VENTOLIN  HFA Inhale 2 puffs into the lungs every 6 (six) hours as needed.   aspirin  EC 81 MG tablet Take 1 tablet (81 mg total) by mouth daily. Swallow whole.   Blood Glucose Monitoring Suppl Devi 1 each by Does not apply route in the morning, at noon, and at bedtime. E13.319. May substitute to any manufacturer covered by patient's insurance.   carbamazepine 100 MG 12 hr capsule Commonly known as: CARBATROL Take 1 capsule (100 mg total) by mouth 2 (two) times daily. For trigeminal neuralgia   clopidogrel  75 MG tablet Commonly known as: PLAVIX  TAKE 1 TABLET(75 MG) BY MOUTH EVERY EVENING   diclofenac  Sodium 1 % Gel Commonly known as: VOLTAREN  Apply 2 g topically 3 (three) times daily.   donepezil  10 MG tablet Commonly known as: ARICEPT  Take 1 tablet (10 mg total) by mouth at bedtime. For memory   Glucagon  Emergency 1 MG Kit INJECT into THE muscle ONCE AS NEEDED FOR emergency   insulin  lispro 100 UNIT/ML injection Commonly known as: HUMALOG  Inject 5 Units into  the skin 3 (three) times daily before meals.   isosorbide  mononitrate 30 MG 24 hr tablet Commonly known as: IMDUR  Take 0.5 tablets (15 mg total) by mouth daily.   lidocaine  5 % Commonly known as: LIDODERM  Place 1 patch onto the skin daily. Remove & Discard patch within 12 hours or as directed by MD   losartan  50 MG tablet Commonly known as: COZAAR  Take 0.5 tablets (25 mg total) by mouth daily. What changed: how much to take   metoprolol  succinate 25 MG 24 hr tablet Commonly known as: TOPROL -XL Take 0.5 tablets (12.5 mg total) by mouth daily. What changed: how much to take   Multivitamin Adult Tabs Take 1 tablet by mouth in the morning and at bedtime.   nitroGLYCERIN  0.4 MG SL tablet Commonly known as: Nitrostat  Place 1 tablet (0.4 mg total) under the tongue every 5 (five) minutes as needed for chest pain.   OneTouch Ultra test strip Generic drug: glucose blood USE THREE TIMES DAILY   PRESERVISION AREDS PO Take by  mouth.   rosuvastatin  20 MG tablet Commonly known as: CRESTOR  Take 1 tablet (20 mg total) by mouth every evening.   Synthroid  75 MCG tablet Generic drug: levothyroxine  TAKE ONE TABLET BY MOUTH Monday-Saturday BEFORE breakfast   topiramate  25 MG tablet Commonly known as: Topamax  Take 1 tablet (25 mg total) by mouth at bedtime. For headache prevention   torsemide  20 MG tablet Commonly known as: DEMADEX  Take 1 tablet (20 mg total) by mouth daily.   Trelegy Ellipta  100-62.5-25 MCG/ACT Aepb Generic drug: Fluticasone-Umeclidin-Vilant Inhale 1 puff into the lungs daily.   Tresiba  FlexTouch 100 UNIT/ML FlexTouch Pen Generic drug: insulin  degludec Inject 55 Units into the skin at bedtime.   vitamin C  100 MG tablet Take 100 mg by mouth daily.   Vitamin D3 25 MCG (1000 UT) Caps Take 2 capsules (2,000 Units total) by mouth daily.        Follow-up Information     Claire Crick, MD. Schedule an appointment as soon as possible for a visit in 1  week(s).   Specialty: Family Medicine Contact information: [address] [place] [address] [phone]                Discharge Exam: There were no vitals filed for this visit. General.  Elderly lady, in no acute distress. Pulmonary.  Lungs clear bilaterally, normal respiratory effort. CV.  Regular rate and rhythm, no JVD, rub or murmur. Abdomen.  Soft, nontender, nondistended, BS positive. CNS.  Alert and oriented .  No focal neurologic deficit. Extremities.  No edema, no cyanosis, pulses intact and symmetrical.  Condition at discharge: stable  The results of significant diagnostics from this hospitalization (including imaging, microbiology, ancillary and laboratory) are listed below for reference.   Imaging Studies: CT Angio Chest PE W and/or Wo Contrast Result Date: 03/08/2024 CLINICAL DATA:  Chest pain. EXAM: CT ANGIOGRAPHY CHEST WITH CONTRAST TECHNIQUE: Multidetector CT imaging of the chest was performed using the standard protocol during bolus administration of intravenous contrast. Multiplanar CT image reconstructions and MIPs were obtained to evaluate the vascular anatomy. RADIATION DOSE REDUCTION: This exam was performed according to the departmental dose-optimization program which includes automated exposure control, adjustment of the mA and/or kV according to patient size and/or use of iterative reconstruction technique. CONTRAST:  75mL OMNIPAQUE  IOHEXOL  350 MG/ML SOLN COMPARISON:  April 11, 2022. FINDINGS: Cardiovascular: Satisfactory opacification of the pulmonary arteries to the segmental level. No evidence of pulmonary embolism. Normal heart size. No pericardial effusion. Mediastinum/Nodes: No enlarged mediastinal, hilar, or axillary lymph nodes. Thyroid  gland, trachea, and esophagus demonstrate no significant findings. Lungs/Pleura: No pneumothorax or pleural effusion is noted. Minimal bilateral posterior basilar subsegmental atelectasis or scarring is  noted. Upper Abdomen: No acute abnormality. Musculoskeletal: No chest wall abnormality. No acute or significant osseous findings. Review of the MIP images confirms the above findings. IMPRESSION: No definite evidence of pulmonary embolus. Aortic Atherosclerosis (ICD10-I70.0). Electronically Signed   By: Rosalene Colon M.D.   On: 03/08/2024 15:27   DG Chest Port 1 View Result Date: 03/08/2024 CLINICAL DATA:  Chest pain, shortness of breath. EXAM: PORTABLE CHEST 1 VIEW COMPARISON:  November 30, 2023. FINDINGS: The heart size and mediastinal contours are within normal limits. Both lungs are clear. The visualized skeletal structures are unremarkable. IMPRESSION: No active disease. Electronically Signed   By: Rosalene Colon M.D.   On: 03/08/2024 08:25    Microbiology: Results for orders placed or performed in visit on 02/25/22  Urine Culture  Status: None   Collection Time: 02/25/22  4:23 PM   Specimen: Urine  Result Value Ref Range Status   MICRO NUMBER: [id number]  Final   SPECIMEN QUALITY: Adequate  Final   Sample Source URINE  Final   STATUS: FINAL  Final   Result:   Final    Mixed genital flora isolated. These superficial bacteria are not indicative of a urinary tract infection. No further organism identification is warranted on this specimen. If clinically indicated, recollect clean-catch, mid-stream urine and transfer  immediately to Urine Culture Transport Tube.    *Note: Due to a large number of results and/or encounters for the requested time period, some results have not been displayed. A complete set of results can be found in Results Review.    Labs: CBC: Recent Labs  Lab 03/08/24 0810 03/09/24 0652  WBC 11.7* 17.2*  HGB 13.1 13.4  HCT 40.1 39.6  MCV 84.1 83.0  PLT 219 257   Basic Metabolic Panel: Recent Labs  Lab 03/08/24 0810 03/08/24 2134 03/09/24 0652  NA 136  --  139  K 3.3*  --  3.3*  CL 102  --  103  CO2 24  --  23  GLUCOSE 245* 438* 131*  BUN 13  --  22   CREATININE 0.76  --  0.96  CALCIUM  9.0  --  9.6  MG 2.0  --   --    Liver Function Tests: Recent Labs  Lab 03/08/24 0810  AST 24  ALT 15  ALKPHOS 81  BILITOT 0.7  PROT 6.4*  ALBUMIN  3.5   CBG: Recent Labs  Lab 03/08/24 1739 03/08/24 2102 03/09/24 0105 03/09/24 0322 03/09/24 0823  GLUCAP 293* 477* 350* 255* 122*    Discharge time spent: greater than 30 minutes.  This record has been created using Conservation officer, historic buildings. Errors have been sought and corrected,but may not always be located. Such creation errors do not reflect on the standard of care.   Signed: Luna Salinas, MD [facility] 03/09/2024

## 2024-03-11 ENCOUNTER — Encounter

## 2024-03-12 ENCOUNTER — Ambulatory Visit

## 2024-03-18 ENCOUNTER — Ambulatory Visit (INDEPENDENT_AMBULATORY_CARE_PROVIDER_SITE_OTHER): Admitting: Family Medicine

## 2024-03-18 ENCOUNTER — Encounter: Payer: Self-pay | Admitting: Family Medicine

## 2024-03-18 VITALS — BP 128/68 | HR 95 | Temp 98.0°F | Ht 63.0 in | Wt 143.0 lb

## 2024-03-18 DIAGNOSIS — E13319 Other specified diabetes mellitus with unspecified diabetic retinopathy without macular edema: Secondary | ICD-10-CM | POA: Diagnosis not present

## 2024-03-18 DIAGNOSIS — R768 Other specified abnormal immunological findings in serum: Secondary | ICD-10-CM

## 2024-03-18 DIAGNOSIS — G8929 Other chronic pain: Secondary | ICD-10-CM | POA: Diagnosis not present

## 2024-03-18 DIAGNOSIS — G3184 Mild cognitive impairment, so stated: Secondary | ICD-10-CM

## 2024-03-18 DIAGNOSIS — R079 Chest pain, unspecified: Secondary | ICD-10-CM | POA: Diagnosis not present

## 2024-03-18 LAB — POCT GLYCOSYLATED HEMOGLOBIN (HGB A1C): Hemoglobin A1C: 11.3 % — AB (ref 4.0–5.6)

## 2024-03-18 NOTE — Progress Notes (Unsigned)
 Ph: [phone] Fax: [phone]   Patient ID: Cassandra Benson, female    DOB: Jun 16, 1941, 83 y.o.   MRN: [id number]  This visit was conducted in person.  BP 128/68   Pulse 95   Temp 98 F (36.7 C) (Oral)   Ht 5' 3 (1.6 m)   Wt 143 lb (64.9 kg)   SpO2 96%   BMI 25.33 kg/m    CC: hospital f/u visit  Subjective:   HPI: Cassandra Benson is a 83 y.o. female presenting on 03/18/2024 for Medical Management of Chronic Issues (Here for DM f/u. )   Recent hospitalization for chest pain on 03/08/2024. Prior to admission developed GI illness - nausea/vomiting, diarrhea followed by chest pain.  Hospital records reviewed. Med rec performed.  D dimer elevated to 0.83. CTA chest negative for PE.  Further evaluation reassuring (CXR, troponins, EKG).  Thought related to costochondritis or other MSK pain treated with lidocaine  patches, toradol  IV.  GI symptoms thought related to viral gastroenteritis or food poisoning.   Today she states she had no GI symptoms at time of recent hospitalization.  H/o intermittent chest pains thought possibly due to coronary artery spasm previously treated with imdur  and PRN SL nitroglycerin .   Latent diabetes - not currently seeing endocrinologist. Diabetes managed with Tresiba  55u daily, humalog  5u TID with meals. She forgot her sugar readings.  Lab Results  Component Value Date   HGBA1C 11.3 (A) 03/18/2024  She is not using continuous glucose monitor.   Today she states she's worried that lupus is flared: Dyspnea, chest pain, dry eyes, confusion and memory loss, pleurisy, constant pain, weakness, rash on face with sores, coughing up mucous, decreased stamina, hair loss, urinary frequency, dizziness, anxiety.   Memory deficits - was in process of neurocognitive evaluation with Dr Hayden in [place] - had appt for comprehensive testing on day she was hospitalized. This needs to be rescheduled.   Home health not set up.  Other follow up  appointments scheduled: none  ______________________________________________________________________ Hospital admission: 03/08/2024 Hospital discharge: 03/09/2024 TCM f/u phone call: not performed  Recommendations at discharge:  Please obtain CBC and BMP and follow-up Patient with some leukocytosis but no fever, can be reactive with concern of costochondritis as there are no other symptoms and imaging normal. Per patient she has an history of lupus-if persistence of symptoms she need to follow-up with her rheumatologist for further assistance. Follow-up with primary care provider within few days.  Discharge Diagnoses: Principal Problem:   Chest pain Active Problems:   Costochondritis     Relevant past medical, surgical, family and social history reviewed and updated as indicated. Interim medical history since our last visit reviewed. Allergies and medications reviewed and updated. Outpatient Medications Prior to Visit  Medication Sig Dispense Refill   acetaminophen  (TYLENOL ) 500 MG tablet Take 1,000 mg by mouth daily.     albuterol  (VENTOLIN  HFA) 108 (90 Base) MCG/ACT inhaler Inhale 2 puffs into the lungs every 6 (six) hours as needed. 8 g 2   Ascorbic Acid  (VITAMIN C ) 100 MG tablet Take 100 mg by mouth daily.     aspirin  EC 81 MG tablet Take 1 tablet (81 mg total) by mouth daily. Swallow whole. 30 tablet 12   Blood Glucose Monitoring Suppl DEVI 1 each by Does not apply route in the morning, at noon, and at bedtime. E13.319. May substitute to any manufacturer covered by patient's insurance. 1 each 0   Cholecalciferol  (VITAMIN D3) 25 MCG (1000 UT)  CAPS Take 2 capsules (2,000 Units total) by mouth daily.     clopidogrel  (PLAVIX ) 75 MG tablet TAKE 1 TABLET(75 MG) BY MOUTH EVERY EVENING 30 tablet 11   diclofenac  Sodium (VOLTAREN ) 1 % GEL Apply 2 g topically 3 (three) times daily. 100 g 1   donepezil  (ARICEPT ) 10 MG tablet Take 1 tablet (10 mg total) by mouth at bedtime. For memory 30 tablet  6   Fluticasone-Umeclidin-Vilant (TRELEGY ELLIPTA ) 100-62.5-25 MCG/ACT AEPB Inhale 1 puff into the lungs daily. 28 each 11   Glucagon , rDNA, (GLUCAGON  EMERGENCY) 1 MG KIT INJECT into THE muscle ONCE AS NEEDED FOR emergency 1 kit 12   glucose blood (ONETOUCH ULTRA) test strip USE THREE TIMES DAILY 100 strip 3   insulin  degludec (TRESIBA  FLEXTOUCH) 100 UNIT/ML FlexTouch Pen Inject 55 Units into the skin at bedtime. 60 mL 3   insulin  lispro (HUMALOG ) 100 UNIT/ML injection Inject 5 Units into the skin 3 (three) times daily before meals.     isosorbide  mononitrate (IMDUR ) 30 MG 24 hr tablet Take 0.5 tablets (15 mg total) by mouth daily. 30 tablet 6   losartan  (COZAAR ) 50 MG tablet Take 0.5 tablets (25 mg total) by mouth daily. (Patient taking differently: Take 50 mg by mouth daily.)     metoprolol  succinate (TOPROL -XL) 25 MG 24 hr tablet Take 0.5 tablets (12.5 mg total) by mouth daily. (Patient taking differently: Take 25 mg by mouth daily.) 45 tablet 3   Multiple Vitamin (MULTIVITAMIN ADULT) TABS Take 1 tablet by mouth in the morning and at bedtime.     Multiple Vitamins-Minerals (PRESERVISION AREDS PO) Take by mouth.     nitroGLYCERIN  (NITROSTAT ) 0.4 MG SL tablet Place 1 tablet (0.4 mg total) under the tongue every 5 (five) minutes as needed for chest pain. 25 tablet PRN   rosuvastatin  (CRESTOR ) 20 MG tablet Take 1 tablet (20 mg total) by mouth every evening. 90 tablet 4   SYNTHROID  75 MCG tablet TAKE ONE TABLET BY MOUTH Monday-Saturday BEFORE breakfast 80 tablet 4   topiramate  (TOPAMAX ) 25 MG tablet Take 1 tablet (25 mg total) by mouth at bedtime. For headache prevention 30 tablet 6   torsemide  (DEMADEX ) 20 MG tablet Take 1 tablet (20 mg total) by mouth daily. 60 tablet 5   carbamazepine (CARBATROL) 100 MG 12 hr capsule Take 1 capsule (100 mg total) by mouth 2 (two) times daily. For trigeminal neuralgia (Patient not taking: Reported on 03/18/2024)     lidocaine  (LIDODERM ) 5 % Place 1 patch onto the  skin daily. Remove & Discard patch within 12 hours or as directed by MD (Patient not taking: Reported on 03/18/2024) 30 patch 0   No facility-administered medications prior to visit.     Per HPI unless specifically indicated in ROS section below Review of Systems  Objective:  BP 128/68   Pulse 95   Temp 98 F (36.7 C) (Oral)   Ht 5' 3 (1.6 m)   Wt 143 lb (64.9 kg)   SpO2 96%   BMI 25.33 kg/m   Wt Readings from Last 3 Encounters:  03/18/24 143 lb (64.9 kg)  02/23/24 145 lb (65.8 kg)  01/29/24 150 lb (68 kg)      Physical Exam Vitals and nursing note reviewed.  Constitutional:      Appearance: Normal appearance. She is not ill-appearing.  HENT:     Head: Normocephalic and atraumatic.     Mouth/Throat:     Mouth: Mucous membranes are moist.  Pharynx: Oropharynx is clear. No oropharyngeal exudate or posterior oropharyngeal erythema.   Eyes:     Extraocular Movements: Extraocular movements intact.     Pupils: Pupils are equal, round, and reactive to light.    Cardiovascular:     Rate and Rhythm: Normal rate and regular rhythm.     Pulses: Normal pulses.     Heart sounds: Normal heart sounds. No murmur heard. Pulmonary:     Effort: Pulmonary effort is normal. No respiratory distress.     Breath sounds: Normal breath sounds. No wheezing, rhonchi or rales.   Musculoskeletal:     Right lower leg: No edema.     Left lower leg: No edema.   Skin:    General: Skin is warm and dry.     Findings: No rash.   Neurological:     Mental Status: She is alert.   Psychiatric:        Mood and Affect: Mood normal.       Results for orders placed or performed in visit on 03/18/24  POCT glycosylated hemoglobin (Hb A1C)   Collection Time: 03/18/24  3:27 PM  Result Value Ref Range   Hemoglobin A1C 11.3 (A) 4.0 - 5.6 %   HbA1c POC (<> result, manual entry)     HbA1c, POC (prediabetic range)     HbA1c, POC (controlled diabetic range)    Sedimentation rate   Collection  Time: 03/18/24  4:22 PM  Result Value Ref Range   Sed Rate 20 0 - 30 mm/hr  ANA   Collection Time: 03/18/24  4:22 PM  Result Value Ref Range   Anti Nuclear Antibody (ANA) POSITIVE (A) NEGATIVE  RNP Antibody   Collection Time: 03/18/24  4:22 PM  Result Value Ref Range   Ribonucleic Protein(ENA) Antibody, IgG <1.0 NEG <1.0 NEG AI  C-reactive protein   Collection Time: 03/18/24  4:22 PM  Result Value Ref Range   CRP <1.0 0.5 - 20.0 mg/dL  CBC with Differential/Platelet   Collection Time: 03/18/24  4:22 PM  Result Value Ref Range   WBC 9.9 4.0 - 10.5 K/uL   RBC 4.93 3.87 - 5.11 Mil/uL   Hemoglobin 13.5 12.0 - 15.0 g/dL   HCT 59.5 63.9 - 53.9 %   MCV 81.9 78.0 - 100.0 fl   MCHC 33.3 30.0 - 36.0 g/dL   RDW 85.6 88.4 - 84.4 %   Platelets 249.0 150.0 - 400.0 K/uL   Neutrophils Relative % 74.8 43.0 - 77.0 %   Lymphocytes Relative 17.5 12.0 - 46.0 %   Monocytes Relative 6.2 3.0 - 12.0 %   Eosinophils Relative 0.4 0.0 - 5.0 %   Basophils Relative 1.1 0.0 - 3.0 %   Neutro Abs 7.4 1.4 - 7.7 K/uL   Lymphs Abs 1.7 0.7 - 4.0 K/uL   Monocytes Absolute 0.6 0.1 - 1.0 K/uL   Eosinophils Absolute 0.0 0.0 - 0.7 K/uL   Basophils Absolute 0.1 0.0 - 0.1 K/uL  Basic metabolic panel with GFR   Collection Time: 03/18/24  4:22 PM  Result Value Ref Range   Sodium 138 135 - 145 mEq/L   Potassium 4.2 3.5 - 5.1 mEq/L   Chloride 102 96 - 112 mEq/L   CO2 29 19 - 32 mEq/L   Glucose, Bld 360 (H) 70 - 99 mg/dL   BUN 12 6 - 23 mg/dL   Creatinine, Ser 8.92 0.40 - 1.20 mg/dL   GFR 51.35 (L) >39.99 mL/min   Calcium  9.4 8.4 -  10.5 mg/dL  Anti-nuclear ab-titer (ANA titer)   Collection Time: 03/18/24  4:22 PM  Result Value Ref Range   ANA Titer 1 1:40 (H) titer   ANA Pattern 1 Nuclear, Homogeneous (A)    *Note: Due to a large number of results and/or encounters for the requested time period, some results have not been displayed. A complete set of results can be found in Results Review.    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  Again forgot sugar log. Advised I cannot titrate insulins without reviewing sugar log. New sugar log provided with copies. I asked her to return in 1 month with log to attempt further insulin  titration. For now, continue: Tresiba  55u nightly, Lispro (Humalog ) 5u with meals (simplified regimen to facilitate adherence).  She notes she has been unable to get functioning CGM.       Relevant Orders   POCT glycosylated hemoglobin (Hb A1C) (Completed)   Positive ANA (antinuclear antibody)   She is concerned about autoimmune flare could be contributing to recent symptoms.  Update autoimmune labs today.  Pt endorses h/o Lupus. Only has confirmed Sjogren's syndrome, fibromyalgia.       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 Hospital records reviewed - d dimer mildly elevated however CTA chest reassuringly negative for PE. Other workup (CXR, troponins, EKG) reassuring as well. Dx MSK chest pain, treated with iv toradol  and lidocaine  patches.       Relevant Orders   Sedimentation rate (Completed)   RNP Antibody (Completed)   C-reactive protein (Completed)   CBC with Differential/Platelet (Completed)   Basic metabolic panel with GFR (Completed)   MCI (mild cognitive impairment) with memory loss   Above chest pain symptoms occurred the night prior to scheduled cognitive evaluation in [facility] for further evaluation of MCI - so this was not completed. Again today states she prefers not to proceed with further memory  testing.         No orders of the defined types were placed in this encounter.   Orders Placed This Encounter  Procedures   Sedimentation rate   ANA   RNP Antibody   C-reactive protein   CBC with Differential/Platelet   Basic metabolic panel with GFR   Anti-nuclear ab-titer (ANA titer)   POCT glycosylated hemoglobin (Hb A1C)    Patient Instructions  Current diabetic regimen: Tresiba  55u nightly at 10pm (pens) Lispro (Humalog ) 5u before meals (pens)  Return in 1 month for diabetes follow up.  Bring in your sugar readings.  Labs today   Follow up plan: Return in about 1 month (around 04/17/2024), or if symptoms worsen or fail to improve, for follow up visit.  Anton Blas, MD

## 2024-03-18 NOTE — Patient Instructions (Addendum)
 Current diabetic regimen: Tresiba  55u nightly at 10pm (pens) Lispro (Humalog ) 5u before meals (pens)  Return in 1 month for diabetes follow up.  Bring in your sugar readings.  Labs today

## 2024-03-19 LAB — CBC WITH DIFFERENTIAL/PLATELET
Basophils Absolute: 0.1 10*3/uL (ref 0.0–0.1)
Basophils Relative: 1.1 % (ref 0.0–3.0)
Eosinophils Absolute: 0 10*3/uL (ref 0.0–0.7)
Eosinophils Relative: 0.4 % (ref 0.0–5.0)
HCT: 40.4 % (ref 36.0–46.0)
Hemoglobin: 13.5 g/dL (ref 12.0–15.0)
Lymphocytes Relative: 17.5 % (ref 12.0–46.0)
Lymphs Abs: 1.7 10*3/uL (ref 0.7–4.0)
MCHC: 33.3 g/dL (ref 30.0–36.0)
MCV: 81.9 fl (ref 78.0–100.0)
Monocytes Absolute: 0.6 10*3/uL (ref 0.1–1.0)
Monocytes Relative: 6.2 % (ref 3.0–12.0)
Neutro Abs: 7.4 10*3/uL (ref 1.4–7.7)
Neutrophils Relative %: 74.8 % (ref 43.0–77.0)
Platelets: 249 10*3/uL (ref 150.0–400.0)
RBC: 4.93 Mil/uL (ref 3.87–5.11)
RDW: 14.3 % (ref 11.5–15.5)
WBC: 9.9 10*3/uL (ref 4.0–10.5)

## 2024-03-19 LAB — BASIC METABOLIC PANEL WITH GFR
BUN: 12 mg/dL (ref 6–23)
CO2: 29 meq/L (ref 19–32)
Calcium: 9.4 mg/dL (ref 8.4–10.5)
Chloride: 102 meq/L (ref 96–112)
Creatinine, Ser: 1.07 mg/dL (ref 0.40–1.20)
GFR: 48.64 mL/min — ABNORMAL LOW (ref >60.00)
Glucose, Bld: 360 mg/dL — ABNORMAL HIGH (ref 70–99)
Potassium: 4.2 meq/L (ref 3.5–5.1)
Sodium: 138 meq/L (ref 135–145)

## 2024-03-19 LAB — SEDIMENTATION RATE: Sed Rate: 20 mm/h (ref 0–30)

## 2024-03-19 LAB — C-REACTIVE PROTEIN: CRP: <1 mg/dL (ref 0.5–20.0)

## 2024-03-20 LAB — ANA: Anti Nuclear Antibody (ANA): POSITIVE — AB

## 2024-03-20 LAB — RNP ANTIBODY: Ribonucleic Protein(ENA) Antibody, IgG: <1 AI

## 2024-03-20 LAB — ANTI-NUCLEAR AB-TITER (ANA TITER): ANA Titer 1: 1:40 {titer} — ABNORMAL HIGH

## 2024-03-21 ENCOUNTER — Encounter: Payer: Self-pay | Admitting: Family Medicine

## 2024-03-21 ENCOUNTER — Ambulatory Visit: Payer: Self-pay | Admitting: Family Medicine

## 2024-03-21 DIAGNOSIS — R768 Other specified abnormal immunological findings in serum: Secondary | ICD-10-CM

## 2024-03-21 NOTE — Assessment & Plan Note (Signed)
 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 Hospital records reviewed - d dimer mildly elevated however CTA chest reassuringly negative for PE. Other workup (CXR, troponins, EKG) reassuring as well. Dx MSK chest pain, treated with iv toradol  and lidocaine  patches.

## 2024-03-21 NOTE — Assessment & Plan Note (Addendum)
 A1c dropped from 13.8 to 12.2 to 11.3 % but remains uncontrolled.  Again forgot sugar log. Advised I cannot titrate insulins without reviewing sugar log. New sugar log provided with copies. I asked her to return in 1 month with log to attempt further insulin  titration. For now, continue: Tresiba  55u nightly, Lispro (Humalog ) 5u with meals (simplified regimen to facilitate adherence).  She notes she has been unable to get functioning CGM.

## 2024-03-21 NOTE — Assessment & Plan Note (Signed)
 Above chest pain symptoms occurred the night prior to scheduled cognitive evaluation in [facility] for further evaluation of MCI - so this was not completed. Again today states she prefers not to proceed with further memory testing.

## 2024-03-21 NOTE — Assessment & Plan Note (Signed)
 She is concerned about autoimmune flare could be contributing to recent symptoms.  Update autoimmune labs today.  Pt endorses h/o Lupus. Only has confirmed Sjogren's syndrome, fibromyalgia.

## 2024-03-25 ENCOUNTER — Encounter: Admitting: Psychology

## 2024-03-26 ENCOUNTER — Encounter: Payer: Self-pay | Admitting: *Deleted

## 2024-03-26 ENCOUNTER — Other Ambulatory Visit: Payer: Self-pay | Admitting: *Deleted

## 2024-03-26 DIAGNOSIS — G3184 Mild cognitive impairment, so stated: Secondary | ICD-10-CM

## 2024-03-27 ENCOUNTER — Other Ambulatory Visit: Payer: Self-pay | Admitting: *Deleted

## 2024-03-27 ENCOUNTER — Encounter: Payer: Self-pay | Admitting: *Deleted

## 2024-03-27 NOTE — Patient Outreach (Signed)
 Complex Care Management   Visit Note  03/26/2024  Name:  Cassandra Benson MRN: [id number] DOB: November 11, 1940  Situation: Referral received for Complex Care Management related to Heart Failure and mild cognitive impairment. I obtained verbal consent from Patient.  Visit completed with [name] on the phone.  Background:   Past Medical History:  Diagnosis Date   Acute diverticulitis 04/2021   [facility] ER, CT confirmed   Allergy    ANA positive    positive ANA pattern 1 speckled   Arthritis    Carotid stenosis, asymptomatic 06/19/2015   1-39% RICA 40-59% LICA rpt 1 yr (05/2015)    CHF (congestive heart failure) (HCC)    Colon polyps    COVID-19 virus infection 09/14/2021   Dermatomyositis (HCC)    Diabetes mellitus without complication (HCC)    Type 1   Diverticulosis    sigmoid on CT scan 12/2019   Family history of adverse reaction to anesthesia    brothr went into cardiac arrest from anectine   Fibromyalgia    prior PCP   GERD (gastroesophageal reflux disease)    prior PCP   Glaucoma    Narrow angle   History of blood clots    DVT, in 20s, none since   History of chicken pox    History of diverticulitis    History of pericarditis 1986   with hospitalization   History of pneumonia 2014   History of shingles    History of UTI    Hyperlipidemia    Hypertension    Hypothyroidism    Mixed connective tissue disease (HCC)    Partial small bowel obstruction (HCC) 12/2019   managed conservatively   Peptic ulcer    Pneumonia    PONV (postoperative nausea and vomiting)    Raynaud's disease without gangrene    Shoulder pain left   h/o RTC tendonitis and adhesive capsulitis   Sigmoid diverticulitis 05/26/2021   Sjogren's syndrome (HCC)    Sleep apnea    prior PCP - no CPAP for about 10 yrs   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HCC)    Vitamin D  deficiency    prior PCP    Assessment: Patient Reported Symptoms:  Cognitive Cognitive Status: Alert and oriented to person,  place, and time, Able to follow simple commands, Struggling with memory recall, Normal speech and language skills, Other: (daughter lives in [place]  but manages patient's finances. Confused about why I was calling at first and was very focused on hospital billing issues and insurance coverage. Stated that her daughter wants her to move to [place]  but she prefers to stay here.) Cognitive/Intellectual Conditions Management [RPT]: None reported or documented in medical history or problem list   Health Maintenance Behaviors: Annual physical exam, Sleep adequate, Healthy diet Healing Pattern: Unsure Health Facilitated by: Healthy diet, Rest  Neurological Neurological Review of Symptoms: Other: [place] Neurological Symptoms/Conditions [RPT]: some difficulty with recall and some confusion Neurological Comment: Patient cancelled neuropsychiatry testing that was schedulled for 03/12/24 due to recent hospitalization and stated she would reschedule. Declined to schedule with psychiatrist earlier in the year. Confusion over recent hospitalization. stated that she was there for 4 days and 4 nights, when she was inpatient for 1 night. This is a sensitive subject. Stated, my cognition is private to me.  HEENT HEENT Symptoms Reported: Not assessed      Cardiovascular Cardiovascular Symptoms Reported: No symptoms reported Does patient have uncontrolled Hypertension?: No Cardiovascular Comment: Hospitalized for one night on 03/08/24 for chest pain. Followed  up with PCP after discharge and has cardiology appointment scheduled for August.  Respiratory Respiratory Symptoms Reported: No symptoms reported    Endocrine Endocrine Symptoms Reported: No symptoms reported Is patient diabetic?: Yes Is patient checking blood sugars at home?: Yes List most recent blood sugar readings, include date and time of day: did not have reading available for review Endocrine Comment: Endocrinologist at [facility]. Will schedule follow-up  with endocrinologist. Last visit was 12/11/23. Blood sugar is uncontrolled but patient works to manage it and is very focused on it. Encouraged to discuss resuming use of continuous glucose monitor. Will need to discuss with endocrinologist.  Gastrointestinal Gastrointestinal Symptoms Reported: No symptoms reported      Genitourinary Genitourinary Symptoms Reported: No symptoms reported    Integumentary Integumentary Symptoms Reported: No symptoms reported    Musculoskeletal Musculoskelatal Symptoms Reviewed: Unsteady gait Musculoskeletal Management Strategies: Medical device Musculoskeletal Self-Management Outcome: 4 (good) Musculoskeletal Comment: uses cane for ambulation when outside of home Falls in the past year?: No Number of falls in past year: 1 or less Was there an injury with Fall?: No Fall Risk Category Calculator: 0 Patient Fall Risk Level: Low Fall Risk Patient at Risk for Falls Due to: Impaired mobility  Psychosocial Psychosocial Symptoms Reported: Irritability Additional Psychological Details: Patient does not endorse having anxiety, but seems anxious and stressed during the call regarding health conditions and management and management of medical costs. She did state that she is frustrated and irritated. She mentioned several times her frustration over how other people are able to get help and seniors that have worked hard are not. Behavioral Management Strategies: Adequate rest, Optimal nutrition intake Behavioral Health Self-Management Outcome: 3 (uncertain) Behavioral Health Comment: Declined psychiatry services earlier this year. Cancelled neurosyshciatry testing but stated that she would reschedule. RN scheduled for telephone appointment with Care Management LCSW. She expressed frustration that in-home care aides do easy tasks that she can do herself but don't do other things, like change her bed sheets. She has not talked to them about her concers. I encouraged her to talk  with them and ask if they can do these tasks for her. Major Change/Loss/Stressor/Fears (CP): Medical condition, self Techniques to Cope with Loss/Stress/Change: None Quality of Family Relationships: supportive, helpful, involved Do you feel physically threatened by others?: No      03/18/2024    4:31 PM  Depression screen PHQ 2/9  Decreased Interest 0  Down, Depressed, Hopeless 0  PHQ - 2 Score 0  Altered sleeping 0  Tired, decreased energy 2  Change in appetite 2  Feeling bad or failure about yourself  0  Trouble concentrating 1  Moving slowly or fidgety/restless 0  Suicidal thoughts 0  PHQ-9 Score 5  Difficult doing work/chores Not difficult at all    There were no vitals filed for this visit.  Medications Reviewed Today     Reviewed by Charlsie Josette SAILOR, RN (Registered Nurse) on 03/27/24 at 1138  Med List Status: <None>   Medication Order Taking? Sig Documenting Provider Last Dose Status Informant  acetaminophen  (TYLENOL ) 500 MG tablet [id number] Yes Take 1,000 mg by mouth daily. Rilla Baller, MD  Active Self, Pharmacy Records           Med Note [facility], JOYCE   Thu Nov 30, 2023 10:25 PM) prn  albuterol  (VENTOLIN  HFA) 108 (90 Base) MCG/ACT inhaler [id number] Yes Inhale 2 puffs into the lungs every 6 (six) hours as needed. Tamea Dedra CROME, MD  Active Self, Pharmacy  Records  Ascorbic Acid  (VITAMIN C ) 100 MG tablet [id number] Yes Take 100 mg by mouth daily. [provider]  Active Self, Pharmacy Records  aspirin  EC 81 MG tablet [id number] Yes Take 1 tablet (81 mg total) by mouth daily. Swallow whole. Rilla Baller, MD  Active Self, Pharmacy Records  Blood Glucose Monitoring Suppl DEVI [id number] Yes 1 each by Does not apply route in the morning, at noon, and at bedtime. E13.319. May substitute to any manufacturer covered by patient's insurance. Rilla Baller, MD  Active Self, Pharmacy Records  carbamazepine (CARBATROL) 100 MG 12 hr capsule [id number]  Take 1  capsule (100 mg total) by mouth 2 (two) times daily. For trigeminal neuralgia  Patient not taking: Reported on 03/27/2024   Rilla Baller, MD  Active Self, Pharmacy Records           Med Note [facility], [facility] A   Fri Mar 08, 2024  5:00 PM) Pt states prn but I don't see any fills from the recent year  Cholecalciferol  (VITAMIN D3) 25 MCG (1000 UT) CAPS [id number] Yes Take 2 capsules (2,000 Units total) by mouth daily. Rilla Baller, MD  Active Self, Pharmacy Records  clopidogrel  (PLAVIX ) 75 MG tablet [id number] Yes TAKE 1 TABLET(75 MG) BY MOUTH EVERY EVENING Gretta Lonni PARAS, MD  Active Self, Pharmacy Records  diclofenac  Sodium (VOLTAREN ) 1 % GEL [id number] Yes Apply 2 g topically 3 (three) times daily. Rilla Baller, MD  Active Self, Pharmacy Records  donepezil  (ARICEPT ) 10 MG tablet [id number] Yes Take 1 tablet (10 mg total) by mouth at bedtime. For memory Rilla Baller, MD  Active Self, Pharmacy Records  Fluticasone-Umeclidin-Vilant (TRELEGY ELLIPTA ) 100-62.5-25 MCG/ACT AEPB [id number] Yes Inhale 1 puff into the lungs daily. Tamea Dedra CROME, MD  Active Self, Pharmacy Records  Glucagon , rDNA, (GLUCAGON  EMERGENCY) 1 MG KIT [id number] Yes INJECT into THE muscle ONCE AS NEEDED FOR emergency Trixie File, MD  Active Self, Pharmacy Records           Med Note LUCIO ELSIE CHRISTELLA Sonjia Jun 20, 2023 10:31 AM) Has not needed in a long time  glucose blood [facility] ULTRA) test strip [id number] Yes USE THREE TIMES DAILY Rilla Baller, MD  Active Self, Pharmacy Records  insulin  degludec (TRESIBA  FLEXTOUCH) 100 UNIT/ML FlexTouch Pen [id number] Yes Inject 55 Units into the skin at bedtime. Rilla Baller, MD  Active Self, Pharmacy Records  insulin  lispro (HUMALOG ) 100 UNIT/ML injection [id number] Yes Inject 5 Units into the skin 3 (three) times daily before meals. Rilla Baller, MD  Active Self, Pharmacy Records           Med Note [place], JOSETTE LOISE [place] Mar 06, 2024  1:31 PM) Patient  reports dose was increased to 15 units 3 times per day with meals  isosorbide  mononitrate (IMDUR ) 30 MG 24 hr tablet [id number] Yes Take 0.5 tablets (15 mg total) by mouth daily. Rilla Baller, MD  Active Self, Pharmacy Records  lidocaine  (LIDODERM ) 5 % [id number]  Place 1 patch onto the skin daily. Remove & Discard patch within 12 hours or as directed by MD  Patient not taking: Reported on 03/27/2024   Amin, Sumayya, MD  Active   losartan  (COZAAR ) 50 MG tablet [id number] Yes Take 0.5 tablets (25 mg total) by mouth daily.  Patient taking differently: Take 50 mg by mouth daily.   Arlon Carliss ORN, DO  Active Self, Pharmacy Records  metoprolol  succinate (TOPROL -XL) 25 MG 24 hr tablet [id number] Yes Take 0.5 tablets (12.5 mg  total) by mouth daily.  Patient taking differently: Take 25 mg by mouth daily.   Rilla Baller, MD  Active Self, Pharmacy Records  Multiple Vitamin (MULTIVITAMIN ADULT) TABS [id number] Yes Take 1 tablet by mouth in the morning and at bedtime. Rilla Baller, MD  Active Self, Pharmacy Records  Multiple Vitamins-Minerals (PRESERVISION AREDS PO) [id number] Yes Take by mouth. [provider]  Active Self, Pharmacy Records  nitroGLYCERIN  (NITROSTAT ) 0.4 MG SL tablet [id number]  Place 1 tablet (0.4 mg total) under the tongue every 5 (five) minutes as needed for chest pain. Mady Bruckner, MD  Expired 03/18/24 2359 Self, Pharmacy Records           Med Note [facility], TIFFANY A   Fri Mar 08, 2024  4:54 PM) prn  rosuvastatin  (CRESTOR ) 20 MG tablet [id number] Yes Take 1 tablet (20 mg total) by mouth every evening. Rilla Baller, MD  Active Self, Pharmacy Records  SYNTHROID  75 MCG tablet [id number] Yes TAKE ONE TABLET BY MOUTH Monday-Saturday BEFORE breakfast Rilla Baller, MD  Active Self, Pharmacy Records  topiramate  (TOPAMAX ) 25 MG tablet [id number] Yes Take 1 tablet (25 mg total) by mouth at bedtime. For headache prevention Rilla Baller, MD  Active Self, Pharmacy  Records  torsemide  (DEMADEX ) 20 MG tablet [id number] Yes Take 1 tablet (20 mg total) by mouth daily. Arlon Carliss ORN, DO  Active Self, Pharmacy Records            Recommendation:   Specialty provider follow-up Neuropsychiatry testing Talk with Care Management LCSW  Follow Up Plan:   Telephone follow up appointment date/time:  04/09/24 at 3:00  Josette Pellet, RN, BSN [place]  [facility] RN Care Manager Direct Dial: [phone]  Fax: [phone]

## 2024-03-27 NOTE — Patient Instructions (Signed)
 Visit Information  Thank you for taking time to visit with me today. Following is a copy of your care plan:   Goals Addressed             This Visit's Progress    VBCI Social Work Care Plan       Problems:   Cognitive Deficits  CSW Clinical Goal(s):   Goals not established at this time, patient declined need for further follow up  Interventions:  Cognitive Decline:   Current level of care: home, alone Evaluation of patient safety in current living environment and assessment of additional community resource needs ADL's Assessed needs, level of care concerns, how currently meeting needs and barriers to care-confirmed that patient takes care of her own ADL's , has good family support, and states managing her memory loss by writing everything down, daughter manages finances, housekeeping 2x per week Active listening / Reflection utilized Emotional Support Provided  PHQ2 completed   Patient Goals/Self-Care Activities:  Goals not established at this time, patient reports having no community resource needs at this time, declined need for further follow up  Plan:   No further follow up required: patient to contact this Ship broker with any additional community resource needs. RNCM notified.         Please call 911 if you are experiencing a Mental Health or Behavioral Health Crisis or need someone to talk to.  Patient verbalizes understanding of instructions and care plan provided today and agrees to view in MyChart. Active MyChart status and patient understanding of how to access instructions and care plan via MyChart confirmed with patient.      Karlin Heilman, LCSW [place]  [facility] Licensed Clinical Social Worker  Direct Dial: [phone]

## 2024-03-27 NOTE — Patient Instructions (Signed)
 Visit Information  Thank you for taking time to visit with me today. Please don't hesitate to contact me if I can be of assistance to you before our next scheduled appointment.  Your next care management appointment is by telephone on 04/09/24 at 3:00   Please call the care guide team at [phone] if you need to cancel, schedule, or reschedule an appointment.   Please call 911 if you are experiencing a Mental Health or Behavioral Health Crisis or need someone to talk to.  Josette Pellet, RN, BSN [place]  [facility] RN Care Manager Direct Dial: [phone]  Fax: [phone]

## 2024-03-27 NOTE — Patient Outreach (Signed)
 Complex Care Management   Visit Note  03/27/2024  Name:  Cassandra Benson MRN: [id number] DOB: [birth year]  Situation: Referral received for Complex Care Management related to cognitive decline    Patient confirms being well supported. -sister that visits weekly, son texts daily and her daughter calls regularly and manages her finances.  Patient confirms ability to take care of her own ADL's , transports herself to her own appts., has a housekeeper 2x per week   I obtained verbal consent from Patient.  Visit completed with patient   on the phone  Background:   Past Medical History:  Diagnosis Date   Acute diverticulitis 04/2021   [facility] ER, CT confirmed   Allergy    ANA positive    positive ANA pattern 1 speckled   Arthritis    Carotid stenosis, asymptomatic 06/19/2015   1-39% RICA 40-59% LICA rpt 1 yr (05/2015)    CHF (congestive heart failure) (HCC)    Colon polyps    COVID-19 virus infection 09/14/2021   Dermatomyositis (HCC)    Diabetes mellitus without complication (HCC)    Type 1   Diverticulosis    sigmoid on CT scan 12/2019   Family history of adverse reaction to anesthesia    brothr went into cardiac arrest from anectine   Fibromyalgia    prior PCP   GERD (gastroesophageal reflux disease)    prior PCP   Glaucoma    Narrow angle   History of blood clots    DVT, in 20s, none since   History of chicken pox    History of diverticulitis    History of pericarditis 1986   with hospitalization   History of pneumonia 2014   History of shingles    History of UTI    Hyperlipidemia    Hypertension    Hypothyroidism    Mixed connective tissue disease (HCC)    Partial small bowel obstruction (HCC) 12/2019   managed conservatively   Peptic ulcer    Pneumonia    PONV (postoperative nausea and vomiting)    Raynaud's disease without gangrene    Shoulder pain left   h/o RTC tendonitis and adhesive capsulitis   Sigmoid diverticulitis 05/26/2021   Sjogren's syndrome (HCC)     Sleep apnea    prior PCP - no CPAP for about 10 yrs   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HCC)    Vitamin D  deficiency    prior PCP    Assessment: Patient Reported Symptoms:  Cognitive Cognitive Status: Alert and oriented to person, place, and time, Able to follow simple commands, Normal speech and language skills Cognitive/Intellectual Conditions Management [RPT]: None reported or documented in medical history or problem list   Health Maintenance Behaviors: Annual physical exam  Neurological Neurological Review of Symptoms: Dizziness Oher Neurological Symptoms/Conditions [RPT]: per patient-dizziness of and on all day Neurological Management Strategies: Medication therapy, Coping strategies Neurological Self-Management Outcome: 3 (uncertain) Neurological Comment: trouble with recall-no longer sharp with my re-call  possible follow up with Neurologist-reluctant to follow up  HEENT HEENT Symptoms Reported: No symptoms reported      Cardiovascular Cardiovascular Symptoms Reported: No symptoms reported    Respiratory Respiratory Symptoms Reported: Shortness of breath Other Respiratory Symptoms: shortness of breath all day Respiratory Management Strategies: Medication therapy  Endocrine Endocrine Symptoms Reported: No symptoms reported Is patient diabetic?: Yes Is patient checking blood sugars at home?: Yes List most recent blood sugar readings, include date and time of day: 260 fasting, 7:50am Endocrine Self-Management Outcome: 3 (uncertain)  Gastrointestinal Gastrointestinal Symptoms Reported: No symptoms reported      Genitourinary Genitourinary Symptoms Reported: No symptoms reported    Integumentary Integumentary Symptoms Reported: No symptoms reported    Musculoskeletal          Psychosocial Psychosocial Symptoms Reported: Irritability Additional Psychological Details: Patient discussed increased frustration regarding her health and management of her diabetes-reports feeling  judged related to the management of her condition Behavioral Management Strategies: Adequate rest, Medication therapy Behavioral Health Comment: patient continues to declined mental health follow up Major Change/Loss/Stressor/Fears (CP): Medical condition, self Behaviors When Feeling Stressed/Fearful: tears,spiritual practices, talks to friend at church and friend for support , acceptance, Techniques to [employer] with Loss/Stress/Change: Spiritual practice(s), Diversional activities Quality of Family Relationships: supportive, helpful, involved Do you feel physically threatened by others?: No      03/27/2024    1:37 PM  Depression screen PHQ 2/9  Decreased Interest 0  Down, Depressed, Hopeless 0  PHQ - 2 Score 0    Vitals:   03/27/24 1310  BP: 128/70  Pulse: 73    Medications Reviewed Today     Reviewed by Ermalinda Lenn HERO, LCSW (Social Worker) on 03/27/24 at 1308  Med List Status: <None>   Medication Order Taking? Sig Documenting Provider Last Dose Status Informant  acetaminophen  (TYLENOL ) 500 MG tablet [id number] Yes Take 1,000 mg by mouth daily. Rilla Baller, MD  Active Self, Pharmacy Records           Med Note [facility], JOYCE   Thu Nov 30, 2023 10:25 PM) prn  albuterol  (VENTOLIN  HFA) 108 [id number] Base) MCG/ACT inhaler [id number] Yes Inhale 2 puffs into the lungs every 6 (six) hours as needed. Tamea Dedra CROME, MD  Active Self, Pharmacy Records  Ascorbic Acid  (VITAMIN C ) 100 MG tablet [id number] Yes Take 100 mg by mouth daily. [provider]  Active Self, Pharmacy Records  aspirin  EC 81 MG tablet [id number] Yes Take 1 tablet (81 mg total) by mouth daily. Swallow whole. Rilla Baller, MD  Active Self, Pharmacy Records  Blood Glucose Monitoring Suppl DEVI [id number] Yes 1 each by Does not apply route in the morning, at noon, and at bedtime. E13.319. May substitute to any manufacturer covered by patient's insurance. Rilla Baller, MD  Active Self, Pharmacy Records   carbamazepine (CARBATROL) 100 MG 12 hr capsule [id number]  Take 1 capsule (100 mg total) by mouth 2 (two) times daily. For trigeminal neuralgia  Patient not taking: Reported on 03/27/2024   Rilla Baller, MD  Active Self, Pharmacy Records           Med Note [facility], [facility] [address]   Fri Mar 08, 2024  5:00 PM) Pt states prn but I don't see any fills from the recent year  Cholecalciferol  (VITAMIN D3) 25 MCG (1000 UT) CAPS [id number] Yes Take 2 capsules (2,000 Units total) by mouth daily. Rilla Baller, MD  Active Self, Pharmacy Records  clopidogrel  (PLAVIX ) 75 MG tablet [id number] Yes TAKE 1 TABLET(75 MG) BY MOUTH EVERY EVENING Gretta Lonni PARAS, MD  Active Self, Pharmacy Records  diclofenac  Sodium (VOLTAREN ) 1 % GEL [id number] Yes Apply 2 g topically 3 (three) times daily. Rilla Baller, MD  Active Self, Pharmacy Records  donepezil  (ARICEPT ) 10 MG tablet [id number] Yes Take 1 tablet (10 mg total) by mouth at bedtime. For memory Rilla Baller, MD  Active Self, Pharmacy Records  Fluticasone-Umeclidin-Vilant (TRELEGY ELLIPTA ) 100-62.5-25 MCG/ACT AEPB [id number] Yes Inhale 1 puff into the lungs daily. Tamea Dedra CROME, MD  Active Self, Pharmacy  Records  Glucagon , rDNA, (GLUCAGON  EMERGENCY) 1 MG KIT [id number] Yes INJECT into THE muscle ONCE AS NEEDED FOR emergency Trixie File, MD  Active Self, Pharmacy Records           Med Note LUCIO ELSIE CHRISTELLA Sonjia Jun 20, 2023 10:31 AM) Has not needed in a long time  glucose blood [facility] ULTRA) test strip [id number] Yes USE THREE TIMES DAILY Rilla Baller, MD  Active Self, Pharmacy Records  insulin  degludec (TRESIBA  FLEXTOUCH) 100 UNIT/ML FlexTouch Pen [id number] Yes Inject 55 Units into the skin at bedtime. Rilla Baller, MD  Active Self, Pharmacy Records  insulin  lispro (HUMALOG ) 100 UNIT/ML injection [id number] Yes Inject 5 Units into the skin 3 (three) times daily before meals. Rilla Baller, MD  Active Self, Pharmacy Records            Med Note [facility], JOSETTE LOISE [place] Mar 06, 2024  1:31 PM) Patient reports dose was increased to 15 units 3 times per day with meals  isosorbide  mononitrate (IMDUR ) 30 MG 24 hr tablet [id number] Yes Take 0.5 tablets (15 mg total) by mouth daily. Rilla Baller, MD  Active Self, Pharmacy Records  lidocaine  (LIDODERM ) 5 % [id number]  Place 1 patch onto the skin daily. Remove & Discard patch within 12 hours or as directed by MD  Patient not taking: Reported on 03/27/2024   Amin, Sumayya, MD  Active   losartan  (COZAAR ) 50 MG tablet [id number]  Take 0.5 tablets (25 mg total) by mouth daily.  Patient taking differently: Take 50 mg by mouth daily.   Arlon Carliss ORN, DO  Active Self, Pharmacy Records  metoprolol  succinate (TOPROL -XL) 25 MG 24 hr tablet [id number]  Take 0.5 tablets (12.5 mg total) by mouth daily.  Patient taking differently: Take 25 mg by mouth daily.   Rilla Baller, MD  Active Self, Pharmacy Records  Multiple Vitamin (MULTIVITAMIN ADULT) TABS [id number] Yes Take 1 tablet by mouth in the morning and at bedtime. Rilla Baller, MD  Active Self, Pharmacy Records  Multiple Vitamins-Minerals (PRESERVISION AREDS PO) [id number] Yes Take by mouth. [provider]  Active Self, Pharmacy Records  nitroGLYCERIN  (NITROSTAT ) 0.4 MG SL tablet [id number]  Place 1 tablet (0.4 mg total) under the tongue every 5 (five) minutes as needed for chest pain. Mady Bruckner, MD  Expired 03/18/24 2359 Self, Pharmacy Records           Med Note [facility], TIFFANY A   Fri Mar 08, 2024  4:54 PM) prn  rosuvastatin  (CRESTOR ) 20 MG tablet [id number] Yes Take 1 tablet (20 mg total) by mouth every evening. Rilla Baller, MD  Active Self, Pharmacy Records  SYNTHROID  75 MCG tablet [id number] Yes TAKE ONE TABLET BY MOUTH Monday-Saturday BEFORE breakfast Rilla Baller, MD  Active Self, Pharmacy Records  topiramate  (TOPAMAX ) 25 MG tablet [id number] Yes Take 1 tablet (25 mg total) by mouth at bedtime. For headache  prevention Rilla Baller, MD  Active Self, Pharmacy Records  torsemide  (DEMADEX ) 20 MG tablet [id number] Yes Take 1 tablet (20 mg total) by mouth daily. Arlon Carliss ORN, DO  Active Self, Pharmacy Records            Recommendation:   PCP Follow-up Specialty provider follow-up as needed  Follow Up Plan:   No social work follow up at this time, however CSW to remain available if any additional needs arise  Tyjon Bowen, LCSW [facility]  [facility], [facility] Licensed Clinical Social Worker  Direct Dial: [phone]

## 2024-04-02 ENCOUNTER — Ambulatory Visit: Admitting: Psychology

## 2024-04-03 ENCOUNTER — Other Ambulatory Visit: Payer: Self-pay

## 2024-04-03 ENCOUNTER — Emergency Department

## 2024-04-03 ENCOUNTER — Emergency Department
Admission: EM | Admit: 2024-04-03 | Discharge: 2024-04-03 | Disposition: A | Discharge status: Home / Self Care | Attending: Emergency Medicine | Admitting: Emergency Medicine

## 2024-04-03 ENCOUNTER — Ambulatory Visit: Payer: Self-pay

## 2024-04-03 ENCOUNTER — Telehealth: Payer: Self-pay

## 2024-04-03 DIAGNOSIS — R739 Hyperglycemia, unspecified: Secondary | ICD-10-CM | POA: Diagnosis not present

## 2024-04-03 DIAGNOSIS — I10 Essential (primary) hypertension: Secondary | ICD-10-CM | POA: Diagnosis not present

## 2024-04-03 DIAGNOSIS — E1165 Type 2 diabetes mellitus with hyperglycemia: Secondary | ICD-10-CM | POA: Insufficient documentation

## 2024-04-03 DIAGNOSIS — E1065 Type 1 diabetes mellitus with hyperglycemia: Secondary | ICD-10-CM | POA: Diagnosis not present

## 2024-04-03 DIAGNOSIS — R079 Chest pain, unspecified: Secondary | ICD-10-CM | POA: Diagnosis not present

## 2024-04-03 DIAGNOSIS — I2510 Atherosclerotic heart disease of native coronary artery without angina pectoris: Secondary | ICD-10-CM | POA: Insufficient documentation

## 2024-04-03 LAB — BASIC METABOLIC PANEL WITH GFR
Anion gap: 10 (ref 5–15)
BUN: 15 mg/dL (ref 8–23)
CO2: 27 mmol/L (ref 22–32)
Calcium: 9.2 mg/dL (ref 8.9–10.3)
Chloride: 101 mmol/L (ref 98–111)
Creatinine, Ser: 0.83 mg/dL (ref 0.44–1.00)
GFR, Estimated: >60 mL/min (ref >60)
Glucose, Bld: 361 mg/dL — ABNORMAL HIGH (ref 70–99)
Potassium: 3.6 mmol/L (ref 3.5–5.1)
Sodium: 138 mmol/L (ref 135–145)

## 2024-04-03 LAB — URINALYSIS, ROUTINE W REFLEX MICROSCOPIC
Bilirubin Urine: NEGATIVE
Glucose, UA: >=500 mg/dL — AB
Hgb urine dipstick: NEGATIVE
Ketones, ur: NEGATIVE mg/dL
Leukocytes,Ua: NEGATIVE
Nitrite: NEGATIVE
Protein, ur: NEGATIVE mg/dL
Specific Gravity, Urine: 1.011 (ref 1.005–1.030)
pH: 5 (ref 5.0–8.0)

## 2024-04-03 LAB — CBC
HCT: 38.2 % (ref 36.0–46.0)
Hemoglobin: 12.7 g/dL (ref 12.0–15.0)
MCH: 27.7 pg (ref 26.0–34.0)
MCHC: 33.2 g/dL (ref 30.0–36.0)
MCV: 83.4 fL (ref 80.0–100.0)
Platelets: 203 K/uL (ref 150–400)
RBC: 4.58 MIL/uL (ref 3.87–5.11)
RDW: 13.9 % (ref 11.5–15.5)
WBC: 6.9 K/uL (ref 4.0–10.5)
nRBC: 0 % (ref 0.0–0.2)

## 2024-04-03 LAB — CBG MONITORING, ED
Glucose-Capillary: 367 mg/dL — ABNORMAL HIGH (ref 70–99)
Glucose-Capillary: 268 mg/dL — ABNORMAL HIGH (ref 70–99)

## 2024-04-03 MED ORDER — KETOROLAC TROMETHAMINE 30 MG/ML IJ SOLN
15.0000 mg | Freq: Once | INTRAMUSCULAR | Status: AC
  Administered 2024-04-03: 15 mg via INTRAVENOUS
  Filled 2024-04-03: qty 1

## 2024-04-03 MED ORDER — INSULIN ASPART 100 UNIT/ML IJ SOLN
10.0000 [IU] | Freq: Once | INTRAMUSCULAR | Status: AC
  Administered 2024-04-03: 10 [IU] via SUBCUTANEOUS
  Filled 2024-04-03: qty 1

## 2024-04-03 MED ORDER — LACTATED RINGERS IV BOLUS
1000.0000 mL | Freq: Once | INTRAVENOUS | Status: AC
  Administered 2024-04-03: 1000 mL via INTRAVENOUS

## 2024-04-03 NOTE — Telephone Encounter (Signed)
 Copied from CRM [id number]. Topic: General - Other >> Apr 01, 2024  3:53 PM Aisha D wrote: Reason for CRM: Pt is requesting to speak with Dr.Gutierrez regarding central diabetes insipidus and also her recent lab results. Pt would like to receive a callback today or tomorrow if possible.

## 2024-04-03 NOTE — Telephone Encounter (Signed)
 FYI Only or Action Required?: FYI only for provider.  Patient was last seen in primary care on 03/18/2024 by Rilla Baller, MD.  Called Nurse Triage reporting Blood Sugar Problem.  Symptoms began today.  Interventions attempted: Prescription medications: HUMALOG , .  Symptoms are: rapidly worsening.  Triage Disposition: Call EMS 911 Now  Patient/caregiver understands and will follow disposition?: Yes  Initial Assessment Questions 1. BLOOD GLUCOSE: What is your blood glucose level? 400, VISION ISSUES, NAUSEA 2. ONSET: When did you check the blood glucose? THIS AM 3. USUAL RANGE: What is your glucose level usually? (e.g., usual fasting morning value, usual evening value) PATIENT IS CRYING AND UPSET; THIS RN CALLED 911 200 AND LOWER 4. KETONES: Do you check for ketones (urine or blood test strips)? If Yes, ask: What does the test show now? NO 5. TYPE 1 or 2: Do you know what type of diabetes you have? (e.g., Type 1, Type 2, Gestational; doesn't know) 2 6. INSULIN : Do you take insulin ? What type of insulin (s) do you use? What is the mode of delivery? (syringe, pen; injection or pump)? YES, HUMALOG , DEGLUDEC  7. DIABETES PILLS: Do you take any pills for your diabetes? If Yes, ask: Have you missed taking any pills recently? NO 8. OTHER SYMPTOMS: Do you have any symptoms? (e.g., fever, frequent urination, difficulty breathing, dizziness, weakness, vomiting) SOUNDS VERY UPSET AND BAD, NAUSEA 9. PREGNANCY: Is there any chance you are pregnant? When was your last menstrual period? NA     Copied from CRM [id number]. Topic: Clinical - Red Word Triage >> Apr 03, 2024 10:32 AM Cassandra Benson wrote: Red Word that prompted transfer to Nurse Triage: Patient called in w/ 400 blood sugar

## 2024-04-03 NOTE — Telephone Encounter (Addendum)
 Noted. See other phone note. Discharged home.

## 2024-04-03 NOTE — ED Triage Notes (Signed)
 Arrived by [facility] from home. Reports no complaints but checked sugar and was high.   EMS vitals: CBG 365 120/70 b/p 80-90HR  EMS reports started a line and fluids due to appearing dehydrated

## 2024-04-03 NOTE — Telephone Encounter (Signed)
 This CRM is dated 04/01/2024 at 3:53pm.  It was not documented until 04/03/2024.  Pt seen at ER today.   I called and spoke with patient.  She is arriving home from ER now.  States nothing was found at ER.   I asked her to drop off sugar log to review recent readings and insulin  use.

## 2024-04-03 NOTE — ED Triage Notes (Signed)
 Pt sts that she has been having dehydration and she does not know why. Pt sts that she is also a type one diabetic. Pt sts that she does not why she is having dehydration.

## 2024-04-03 NOTE — ED Provider Notes (Signed)
 [facility] Provider Note    Event Date/Time   First MD Initiated Contact with Patient 04/03/24 1311     (approximate)   History   Dehydration   HPI  Cassandra Benson is a 83 y.o. female who presents to the ED for evaluation of Dehydration   I review of sick/14 medical DC summary.  Admitted overnight for chest discomfort and diagnosed with costochondritis.  She history of single-vessel CAD, HTN, HLD, DM, PAD  Patient presents to the ED because she had glucometer reading of over 400 this morning.  Reports compliance with her insulin  and reports that she takes her diabetes deadly seriously.  Reports no symptoms such as emesis, fevers, dysuria, cough.  Reports feeling fine but worried about her diabetes as her sugars are quite high.   Physical Exam   Triage Vital Signs: ED Triage Vitals  Encounter Vitals Group     BP 04/03/24 1137 (!) 125/110     Girls Systolic BP Percentile --      Girls Diastolic BP Percentile --      Boys Systolic BP Percentile --      Boys Diastolic BP Percentile --      Pulse Rate 04/03/24 1137 61     Resp 04/03/24 1137 17     Temp 04/03/24 1137 98.4 F (36.9 C)     Temp Source 04/03/24 1137 Oral     SpO2 04/03/24 1137 100 %     Weight 04/03/24 1138 149 lb (67.6 kg)     Height 04/03/24 1138 5' 4 (1.626 m)     Head Circumference --      Peak Flow --      Pain Score 04/03/24 1137 2     Pain Loc --      Pain Education --      Exclude from Growth Chart --     Most recent vital signs: Vitals:   04/03/24 1137  BP: (!) 125/110  Pulse: 61  Resp: 17  Temp: 98.4 F (36.9 C)  SpO2: 100%    General: Awake, no distress.  Quite loquacious CV:  Good peripheral perfusion.  Resp:  Normal effort.  Abd:  No distention.  Soft and benign MSK:  No deformity noted.  Neuro:  No focal deficits appreciated. Other:     ED Results / Procedures / Treatments   Labs (all labs ordered are listed, but only abnormal results  are displayed) Labs Reviewed  BASIC METABOLIC PANEL WITH GFR - Abnormal; Notable for the following components:      Result Value   Glucose, Bld 361 (*)    All other components within normal limits  URINALYSIS, ROUTINE W REFLEX MICROSCOPIC - Abnormal; Notable for the following components:   Color, Urine YELLOW (*)    APPearance CLEAR (*)    Glucose, UA >=500 (*)    Bacteria, UA RARE (*)    All other components within normal limits  CBG MONITORING, ED - Abnormal; Notable for the following components:   Glucose-Capillary 367 (*)    All other components within normal limits  CBC  CBG MONITORING, ED  CBG MONITORING, ED    EKG   RADIOLOGY CXR interpreted by me without evidence of acute cardiopulmonary pathology.  Official radiology report(s): DG Chest 2 View Result Date: 04/03/2024 CLINICAL DATA:  right sided chest pain EXAM: CHEST - 2 VIEW COMPARISON:  March 08, 2024 FINDINGS: No focal airspace consolidation, pleural effusion, or pneumothorax. No cardiomegaly.No acute fracture or  destructive lesion. IMPRESSION: No acute cardiopulmonary abnormality. Electronically Signed   By: Rogelia Myers M.D.   On: 04/03/2024 14:14    PROCEDURES and INTERVENTIONS:  Procedures  Medications  lactated ringers  bolus 1,000 mL (0 mLs Intravenous Stopped 04/03/24 1446)  insulin  aspart (novoLOG ) injection 10 Units (10 Units Subcutaneous Given 04/03/24 1329)  ketorolac  (TORADOL ) 30 MG/ML injection 15 mg (15 mg Intravenous Given 04/03/24 1339)     IMPRESSION / MDM / ASSESSMENT AND PLAN / ED COURSE  I reviewed the triage vital signs and the nursing notes.  Differential diagnosis includes, but is not limited to, DKA, UTI, pneumonia, medication noncompliance, medication mixup at home  {Patient presents with symptoms of an acute illness or injury that is potentially life-threatening.  Patient presents to the ED asymptomatic with hyperglycemia.  Looks well with a normal exam and reassuring workup.  Glucose  in the 300s improving to the 200s with fluids and subcutaneous insulin .  Normal CBC, anion gap without signs of DKA.  No signs of UTI, clear CXR.  Remains well-appearing and suitable for outpatient management.  She has her medications at home.  Discussed PCP follow-up and close ED return precautions.  Clinical Course as of 04/03/24 1511  Wed Apr 03, 2024  1508 Reassessed, discussed workup and plan of care.  She is agreeable. [DS]  1510 Repeat cbg 260s [DS]    Clinical Course User Index [DS] Claudene Rover, MD     FINAL CLINICAL IMPRESSION(S) / ED DIAGNOSES   Final diagnoses:  Hyperglycemia     Rx / DC Orders   ED Discharge Orders     None        Note:  This document was prepared using Dragon voice recognition software and may include unintentional dictation errors.   Claudene Rover, MD 04/03/24 [id number]

## 2024-04-05 ENCOUNTER — Emergency Department
Admission: EM | Admit: 2024-04-05 | Discharge: 2024-04-05 | Disposition: A | Discharge status: Home / Self Care | Attending: Emergency Medicine | Admitting: Emergency Medicine

## 2024-04-05 ENCOUNTER — Emergency Department

## 2024-04-05 ENCOUNTER — Ambulatory Visit: Payer: Self-pay

## 2024-04-05 ENCOUNTER — Other Ambulatory Visit: Payer: Self-pay

## 2024-04-05 DIAGNOSIS — E039 Hypothyroidism, unspecified: Secondary | ICD-10-CM | POA: Diagnosis not present

## 2024-04-05 DIAGNOSIS — R0602 Shortness of breath: Secondary | ICD-10-CM | POA: Diagnosis not present

## 2024-04-05 DIAGNOSIS — R531 Weakness: Secondary | ICD-10-CM | POA: Diagnosis not present

## 2024-04-05 DIAGNOSIS — I10 Essential (primary) hypertension: Secondary | ICD-10-CM | POA: Diagnosis not present

## 2024-04-05 DIAGNOSIS — I509 Heart failure, unspecified: Secondary | ICD-10-CM | POA: Diagnosis not present

## 2024-04-05 DIAGNOSIS — R0789 Other chest pain: Secondary | ICD-10-CM | POA: Insufficient documentation

## 2024-04-05 DIAGNOSIS — E119 Type 2 diabetes mellitus without complications: Secondary | ICD-10-CM | POA: Insufficient documentation

## 2024-04-05 DIAGNOSIS — I2510 Atherosclerotic heart disease of native coronary artery without angina pectoris: Secondary | ICD-10-CM | POA: Diagnosis not present

## 2024-04-05 DIAGNOSIS — R739 Hyperglycemia, unspecified: Secondary | ICD-10-CM | POA: Diagnosis not present

## 2024-04-05 LAB — CBC
HCT: 41.8 % (ref 36.0–46.0)
Hemoglobin: 13.6 g/dL (ref 12.0–15.0)
MCH: 27.3 pg (ref 26.0–34.0)
MCHC: 32.5 g/dL (ref 30.0–36.0)
MCV: 83.8 fL (ref 80.0–100.0)
Platelets: 200 K/uL (ref 150–400)
RBC: 4.99 MIL/uL (ref 3.87–5.11)
RDW: 13.6 % (ref 11.5–15.5)
WBC: 6.4 K/uL (ref 4.0–10.5)
nRBC: 0 % (ref 0.0–0.2)

## 2024-04-05 LAB — BASIC METABOLIC PANEL WITH GFR
Anion gap: 9 (ref 5–15)
BUN: 12 mg/dL (ref 8–23)
CO2: 27 mmol/L (ref 22–32)
Calcium: 9.3 mg/dL (ref 8.9–10.3)
Chloride: 102 mmol/L (ref 98–111)
Creatinine, Ser: 0.78 mg/dL (ref 0.44–1.00)
GFR, Estimated: >60 mL/min (ref >60)
Glucose, Bld: 330 mg/dL — ABNORMAL HIGH (ref 70–99)
Potassium: 3.6 mmol/L (ref 3.5–5.1)
Sodium: 138 mmol/L (ref 135–145)

## 2024-04-05 LAB — TROPONIN I (HIGH SENSITIVITY): Troponin I (High Sensitivity): 4 ng/L (ref <18)

## 2024-04-05 LAB — BRAIN NATRIURETIC PEPTIDE: B Natriuretic Peptide: 88.3 pg/mL (ref 0.0–100.0)

## 2024-04-05 LAB — RESP PANEL BY RT-PCR (RSV, FLU A&B, COVID)  RVPGX2
Influenza A by PCR: NEGATIVE
Influenza B by PCR: NEGATIVE
Resp Syncytial Virus by PCR: NEGATIVE
SARS Coronavirus 2 by RT PCR: NEGATIVE

## 2024-04-05 MED ORDER — ACETAMINOPHEN 325 MG PO TABS
650.0000 mg | ORAL_TABLET | Freq: Once | ORAL | Status: AC
  Administered 2024-04-05: 650 mg via ORAL
  Filled 2024-04-05: qty 2

## 2024-04-05 MED ORDER — KETOROLAC TROMETHAMINE 30 MG/ML IJ SOLN
15.0000 mg | Freq: Once | INTRAMUSCULAR | Status: AC
  Administered 2024-04-05: 15 mg via INTRAMUSCULAR
  Filled 2024-04-05: qty 1

## 2024-04-05 MED ORDER — LIDOCAINE 5 % EX PTCH
1.0000 | MEDICATED_PATCH | CUTANEOUS | Status: DC
  Administered 2024-04-05: 1 via TRANSDERMAL
  Filled 2024-04-05: qty 1

## 2024-04-05 NOTE — ED Triage Notes (Signed)
 Pt comes with c/o sob. Pt was seen here two days ago for same. Pt was released and cleared. Pt states sob when walking. Pt has hx of chf. Pt is I  Pt states left sided rib pain. Pt feels something is wrong and no one is taking it seriously.   BP 117/84 70 SR 20 CBG 336 97.1 temp

## 2024-04-05 NOTE — ED Provider Notes (Signed)
 [facility] Provider Note    Event Date/Time   First MD Initiated Contact with Patient 04/05/24 1501     (approximate)   History   Shortness of Breath   HPI  Cassandra Benson is a 83 y.o. female with history of nonobstructive CAD, hypertension, hyperlipidemia, diabetes, hypothyroidism, presenting with chest pain and shortness of breath.  Symptoms have been ongoing for several weeks.  Patient states that she has been seen by her cardiologist as well as family medicine doctor.  Was admitted in June for similar symptoms.  It was noted that the chest pain was thought to be secondary to costochondritis as well as coronary artery spasms.  Patient states that she did not know that she was supposed to be taking Imdur  and as needed nitroglycerin  for this.  Has been taking Tylenol  for the pain.  She denies any new cough, no fevers, no new leg swelling.  States that no shortness of breath now.  States that it is more like the chest pain takes her breath away.  Also states that her blood glucose was 500 yesterday.  No recent travel or surgeries, no recent trauma or falls, no history of blood clots, recent history of malignancies, no hemoptysis, no unilateral calf swelling or tenderness, no urinary symptoms or nausea vomiting diarrhea.  Patient does state that she is followed by pulmonologist, cardiologist as well as primary care doctor.  On independent chart review, she was admitted in mid June for her described symptoms, had a D-dimer that was elevated to 0.83, had a CT PE study that was done without any acute findings or PE.  She had an echo done in March that showed normal EF, grade 1 diastolic dysfunction.  Had a right heart cath done yesterday that showed 50% stenosis of the RCA.  There was slight improvement with intracoronary nitroglycerin  suggesting possible coronary vasospasm.     Physical Exam   Triage Vital Signs: ED Triage Vitals  Encounter Vitals Group      BP 04/05/24 1321 117/60     Girls Systolic BP Percentile --      Girls Diastolic BP Percentile --      Boys Systolic BP Percentile --      Boys Diastolic BP Percentile --      Pulse Rate 04/05/24 1321 67     Resp 04/05/24 1321 17     Temp 04/05/24 1321 97.8 F (36.6 C)     Temp Source 04/05/24 1321 Oral     SpO2 04/05/24 1321 100 %     Weight 04/05/24 1227 149 lb (67.6 kg)     Height 04/05/24 1227 5' 4 (1.626 m)     Head Circumference --      Peak Flow --      Pain Score 04/05/24 1227 0     Pain Loc --      Pain Education --      Exclude from Growth Chart --     Most recent vital signs: Vitals:   04/05/24 1321  BP: 117/60  Pulse: 67  Resp: 17  Temp: 97.8 F (36.6 C)  SpO2: 100%     General: Awake, no distress.  CV:  Good peripheral perfusion.  Resp:  Normal effort.  Right anterior thoracic cage tenderness, clear Abd:  No distention.  Soft nontender Other:  Trace ankle edema that appears symmetrical, she has no calf tenderness or swelling.   ED Results / Procedures / Treatments   Labs (  all labs ordered are listed, but only abnormal results are displayed) Labs Reviewed  BASIC METABOLIC PANEL WITH GFR - Abnormal; Notable for the following components:      Result Value   Glucose, Bld 330 (*)    All other components within normal limits  RESP PANEL BY RT-PCR (RSV, FLU A&B, COVID)  RVPGX2  CBC  BRAIN NATRIURETIC PEPTIDE  TROPONIN I (HIGH SENSITIVITY)  TROPONIN I (HIGH SENSITIVITY)     EKG  EKG shows, sinus rhythm, rate 73, normal QRS, normal QTc, no obvious ischemic ST elevation, T wave flattening in 3, not seen which is compared to prior   RADIOLOGY On my independent interpretation, chest x-ray without obvious consolidation   PROCEDURES:  Critical Care performed: No  Procedures   MEDICATIONS ORDERED IN ED: Medications  lidocaine  (LIDODERM ) 5 % 1 patch (1 patch Transdermal Patch Applied 04/05/24 1619)  ketorolac  (TORADOL ) 30 MG/ML injection 15 mg  (15 mg Intramuscular Given 04/05/24 1619)  acetaminophen  (TYLENOL ) tablet 650 mg (650 mg Oral Given 04/05/24 1619)     IMPRESSION / MDM / ASSESSMENT AND PLAN / ED COURSE  I reviewed the triage vital signs and the nursing notes.                              Differential diagnosis includes, but is not limited to, costochondritis, musculoskeletal pain, strain, ACS, angina, coronary vasospasms, she does not have any cough or fevers at home although she states that she does not mount a fever when she gets sick.  Also considered anxiety.  Did consider PE but patient had a negative CT PE study done last month with similar symptoms.  Also no other risk factors for it.  Labs, EKG, troponin, chest x-ray.  Give her Lidoderm  patch as well as Toradol .  Patient's presentation is most consistent with acute presentation with potential threat to life or bodily function.  Independent interpretation of labs and imaging below.  Labs are reassuring, glucose is mildly elevated but no evidence of DKA.  Consulted cardiology who agreed with the workup so far, no additional workup necessary here, they will follow-up with her outpatient.  On reassessment patient is well-appearing, tolerating p.o., not short of breath or hypoxic at this time.  Considered but no indication for inpatient admission at this time, she safe for outpatient management.  Will discharge with strict turn precautions as well as instructions that cardiology will contact her to schedule follow-up appointment.  Strict turn precautions given.  Shared decision making done with patient and she is agreeable with this plan.  Discharge.  The patient is on the cardiac monitor to evaluate for evidence of arrhythmia and/or significant heart rate changes.   Clinical Course as of 04/05/24 1704  Fri Apr 05, 2024  1634 Independent review of labs, respiratory viral panel negative, troponin is not elevated, BNP is normal, electrolytes not severely deranged, no  leukocytosis. [TT]    Clinical Course User Index [TT] Waymond, Lorelle Cummins, MD     FINAL CLINICAL IMPRESSION(S) / ED DIAGNOSES   Final diagnoses:  Shortness of breath  Chest wall pain     Rx / DC Orders   ED Discharge Orders     None        Note:  This document was prepared using Dragon voice recognition software and may include unintentional dictation errors.     Waymond Lorelle Cummins, MD 04/05/24 [phone]

## 2024-04-05 NOTE — Discharge Instructions (Signed)
 I have spoken to Dr. Tresia cardiology group and they will contact you to schedule a follow-up ointment soon.

## 2024-04-05 NOTE — Telephone Encounter (Signed)
 911 called on patient's behalf, this RN stayed on phone with patient until EMS arrival. Advised to call [facility] after discharge.  FYI Only or Action Required?: FYI only for provider.  Patient was last seen in primary care on 03/18/2024 by Rilla Baller, MD.  Called Nurse Triage reporting No chief complaint on file..  Symptoms began yesterday.  Interventions attempted: Other: Rest with no improvement.  Symptoms are: gradually worsening.  Triage Disposition: Call EMS 911 Now  Patient/caregiver understands and will follow disposition?: Yes  Copied from CRM [id number]. Topic: Clinical - Red Word Triage >> Apr 05, 2024 11:05 AM Elle L wrote: Red Word that prompted transfer to Nurse Triage: The patient was returning a call from the office but I did not see this reflected on her chart. However, she is experiencing shortness of breath. She is in congestive heart failure and was recently discharged from the Hospital. Reason for Disposition  SEVERE difficulty breathing (e.g., struggling for each breath, speaks in single words)  Sounds like a life-threatening emergency to the triager  Answer Assessment - Initial Assessment Questions Patient with hx of CHF and DM transferred to Triage for c/c of SOB and hyperglycemia. Patient is speaking clearly, but not in full sentences. Patient reports BS >300 today. Patient agreed to having 911 activated, another [facility] nurse called on patient's behalf. This RN stayed on the phone with patient until EMS arrival.   1. RESPIRATORY STATUS: Describe your breathing? (e.g., wheezing, shortness of breath, unable to speak, severe coughing)      SOB, unable to speak in full and clear sentences 2. ONSET: When did this breathing problem begin?      Yesterday, worse today 3. PATTERN Does the difficult breathing come and go, or has it been constant since it started?      Constant 4. SEVERITY: How bad is your breathing? (e.g., mild, moderate, severe)       Moderate to severe 5. RECURRENT SYMPTOM: Have you had difficulty breathing before? If Yes, ask: When was the last time? and What happened that time?      Hx of CHF 6. CARDIAC HISTORY: Do you have any history of heart disease? (e.g., heart attack, angina, bypass surgery, angioplasty)      CHF 7. LUNG HISTORY: Do you have any history of lung disease?  (e.g., pulmonary embolus, asthma, emphysema)     Yes 8. CAUSE: What do you think is causing the breathing problem?      CHF or DM (BS >300) 9. OTHER SYMPTOMS: Do you have any other symptoms? (e.g., chest pain, cough, dizziness, fever, runny nose)     Pain with deep breathing  10. O2 SATURATION MONITOR:  Do you use an oxygen saturation monitor (pulse oximeter) at home? If Yes, ask: What is your reading (oxygen level) today? What is your usual oxygen saturation reading? (e.g., 95%)       Monitor not available  Protocols used: Breathing Difficulty-A-AH

## 2024-04-08 ENCOUNTER — Telehealth: Payer: Self-pay

## 2024-04-08 NOTE — Telephone Encounter (Unsigned)
 Copied from CRM [id number]. Topic: Clinical - Medical Advice >> Apr 08, 2024  8:46 AM [name] GRADE wrote: Reason for CRM: Patient is calling because she checked her sugar levels and this morning and it is at 410, offered patient to speak with a triage nurse but she refused and only wants to speak with Dr.Gutierrez. She wants to left Dr.Gutierrez know that she only had oatmeal for dinner last night and is extremely frustrated on why her levels are so high. >> Apr 08, 2024 12:35 PM Leotis ORN wrote: Patient returned a missed call but no documentation of outreach was found in the chart. She reported distress due to hyperglycemia and was seeking assistance. Patient declined to speak with [facility] Nurse Triage and specifically requested a call from someone in the clinic. CAL was contacted to help determine who placed the original outreach, but they were unable to identify the source. CAL advised to send a CRM to the clinic. Patient was advised that clinical staff may currently be with other patients and will return her call when available. She verbalized understanding. Please call patient back [phone]  (H)

## 2024-04-08 NOTE — Telephone Encounter (Signed)
 Spoke with pt relaying Dr Talmadge message. Pt verbalizes understanding and will bring log to OV next week.

## 2024-04-08 NOTE — Progress Notes (Signed)
 Complex Care Management Care Guide Note  04/08/2024 Name: Cassandra Benson MRN: [id number] DOB: [birth year]  Cassandra Benson is a 84 y.o. year old female who is a primary care patient of Rilla Baller, MD and is actively engaged with the care management team. I reached out to [name] by phone today to assist with re-scheduling  with the Pharmacist Licensed Clinical Social Worker.  Follow up plan: The care guide will reach out to the patient again over the next 7 days.  Dreama [facility]  [facility], [facility] Management Assistant Direct Dial: [phone]  Fax: [phone]

## 2024-04-08 NOTE — Telephone Encounter (Signed)
 I understand her frustration with her hyperglycemia - but I don't think high sugars are caused so much by her diet as due to unmet insulin  needs.  Please have her drop off sugar log with insulin  administration to review how she's taking her insulin  at home. That is the safest way for us  to then titrate insulin  dose accordingly.   Current diabetic regimen: Tresiba  55u nightly at 10pm (pens) Lispro (Humalog ) 5u before meals (pens)

## 2024-04-08 NOTE — Telephone Encounter (Signed)
 Copied from CRM [id number]. Topic: Clinical - Medical Advice >> Apr 08, 2024  8:46 AM [name] GRADE wrote: Reason for CRM: Patient is calling because she checked her sugar levels and this morning and it is at 410, offered patient to speak with a triage nurse but she refused and only wants to speak with Dr.Gutierrez. She wants to left Dr.Gutierrez know that she only had oatmeal for dinner last night and is extremely frustrated on why her levels are so high.

## 2024-04-09 ENCOUNTER — Telehealth: Payer: Self-pay | Admitting: *Deleted

## 2024-04-09 ENCOUNTER — Telehealth: Payer: Self-pay

## 2024-04-09 NOTE — Progress Notes (Signed)
 Complex Care Management Care Guide Note  04/09/2024 Name: [name] MRN: [id number] DOB: 06-21-1941  Cassandra Benson is a 83 y.o. year old female who is a primary care patient of Rilla Baller, MD and is actively engaged with the care management team. I reached out to [name] by phone today to assist with re-scheduling  with the RN Case Manager.  Follow up plan: Telephone appointment with complex care management team member scheduled for:  04/19/24  Leotis Rase [facility], [facility] Guide  Direct Dial: [phone]  Fax [phone]

## 2024-04-10 ENCOUNTER — Ambulatory Visit: Payer: Self-pay | Admitting: Medical

## 2024-04-10 ENCOUNTER — Ambulatory Visit: Attending: Medical | Admitting: Medical

## 2024-04-10 ENCOUNTER — Encounter: Payer: Self-pay | Admitting: Medical

## 2024-04-10 ENCOUNTER — Other Ambulatory Visit
Admission: RE | Admit: 2024-04-10 | Discharge: 2024-04-10 | Disposition: A | Discharge status: Home / Self Care | Source: Ambulatory Visit | Attending: Medical | Admitting: Medical

## 2024-04-10 VITALS — BP 120/60 | HR 74 | Ht 63.0 in | Wt 145.4 lb

## 2024-04-10 DIAGNOSIS — I10 Essential (primary) hypertension: Secondary | ICD-10-CM

## 2024-04-10 DIAGNOSIS — I779 Disorder of arteries and arterioles, unspecified: Secondary | ICD-10-CM

## 2024-04-10 DIAGNOSIS — R079 Chest pain, unspecified: Secondary | ICD-10-CM

## 2024-04-10 DIAGNOSIS — I25111 Atherosclerotic heart disease of native coronary artery with angina pectoris with documented spasm: Secondary | ICD-10-CM | POA: Diagnosis not present

## 2024-04-10 DIAGNOSIS — E782 Mixed hyperlipidemia: Secondary | ICD-10-CM | POA: Diagnosis not present

## 2024-04-10 DIAGNOSIS — I5032 Chronic diastolic (congestive) heart failure: Secondary | ICD-10-CM | POA: Insufficient documentation

## 2024-04-10 LAB — CBC
HCT: 39.3 % (ref 36.0–46.0)
Hemoglobin: 13 g/dL (ref 12.0–15.0)
MCH: 27.5 pg (ref 26.0–34.0)
MCHC: 33.1 g/dL (ref 30.0–36.0)
MCV: 83.3 fL (ref 80.0–100.0)
Platelets: 211 K/uL (ref 150–400)
RBC: 4.72 MIL/uL (ref 3.87–5.11)
RDW: 13.6 % (ref 11.5–15.5)
WBC: 8.6 K/uL (ref 4.0–10.5)
nRBC: 0 % (ref 0.0–0.2)

## 2024-04-10 LAB — BASIC METABOLIC PANEL WITH GFR
Anion gap: 10 (ref 5–15)
BUN: 17 mg/dL (ref 8–23)
CO2: 24 mmol/L (ref 22–32)
Calcium: 9.5 mg/dL (ref 8.9–10.3)
Chloride: 104 mmol/L (ref 98–111)
Creatinine, Ser: 0.91 mg/dL (ref 0.44–1.00)
GFR, Estimated: >60 mL/min (ref >60)
Glucose, Bld: 252 mg/dL — ABNORMAL HIGH (ref 70–99)
Potassium: 3.6 mmol/L (ref 3.5–5.1)
Sodium: 138 mmol/L (ref 135–145)

## 2024-04-10 MED ORDER — ISOSORBIDE MONONITRATE ER 30 MG PO TB24: 30.0000 mg | ORAL_TABLET | Freq: Every day | ORAL | 3 refills | Status: AC

## 2024-04-10 MED ORDER — DIPHENHYDRAMINE HCL 50 MG PO TABS: 50.0000 mg | ORAL_TABLET | Freq: Once | ORAL | 0 refills | Status: DC

## 2024-04-10 MED ORDER — PREDNISONE 50 MG PO TABS: ORAL_TABLET | ORAL | 0 refills | Status: DC

## 2024-04-10 NOTE — H&P (View-Only) (Signed)
 Cardiology Office Note   Date:  04/10/2024  ID:  Cassandra Benson, DOB 10-11-1940, MRN [id number] PCP: Rilla Baller, MD  [facility] Providers Cardiologist:  Evalene Lunger, MD   History of Present Illness Cassandra Benson is a 83 y.o. female with a h/o HTN, HLD, hypothyroidism, nonobstructive CAD, fibromyalgia, PAD, Sjrogren's syndrome and heart failure with preserved EF, adjustment disorder, bilateral carotid disease who presents for ER follow-up for chest pain.    2000 LHC without significant disease. 03/2019 MPI without ischemia and ruled a low risk scan. 07/2020 echo with EF 55 to 60%, G2 DD. 10/2019 Cardiac CTA with no CAD. 11/2022 R/L heart cath with no critical artery disease, ~50% stenosis af the ostium of the RCA, no significant CAD in the Left coronary artery, normal L&R filling pressures, normal CO/CI, possible component of vasospasm. Echo 3/204 LVEF 60-65%  Patient was last seen 02/2023 and was overall stable from a cardiac perspective.   The patient was admitted 11/2023 for near syncope/syncope due to orthostatic hypotension. Echo during admission 3/025 showed LVEF 60-65%, no WMA, G1DD.  The patient was admitted in June 2025 for chest pain felt to be from costochondritis. Hs troponin negative.   She was seen in the ER 04/03/24 for dehydration and elevated BG. She was given IVF  She went to the ER 04/05/24 for chest pain (same as prior admission). HS trop was negative. She was discharged home and told to follow-up with cardiology as outpatient.   Today, the patient is very distraught given multiple hospital visits for chest pain, and feeling her complaints were being ignored. She reports significant family history of CAD. She reports sharp chest pain that goes across the chest and into the shoulder. She does notice it is worse with exertion. It occurred 4 times before going to the ER. She has chronic SOB, that may be from lungs. She denies lower leg edema.     Studies Reviewed EKG Interpretation Date/Time:  Wednesday April 10 2024 14:42:16 EDT Ventricular Rate:  74 PR Interval:  184 QRS Duration:  68 QT Interval:  382 QTC Calculation: 424 R Axis:   3  Text Interpretation: Normal sinus rhythm Low voltage QRS When compared with ECG of 05-Apr-2024 12:31, Nonspecific T wave abnormality now evident in Lateral leads Confirmed by Franchester, Chanti Golubski ([id number]) on 04/10/2024 2:46:14 PM    Echo 11/2023 1. Left ventricular ejection fraction, by estimation, is 60 to 65%. Left  ventricular ejection fraction by 2D MOD biplane is 61.9 %. The left  ventricle has normal function. The left ventricle has no regional wall  motion abnormalities. Left ventricular  diastolic parameters are consistent with Grade I diastolic dysfunction  (impaired relaxation).   2. Right ventricular systolic function is normal. The right ventricular  size is normal. There is normal pulmonary artery systolic pressure.   3. The mitral valve is normal in structure. No evidence of mitral valve  regurgitation.   4. The aortic valve is tricuspid. Aortic valve regurgitation is not  visualized. Aortic valve sclerosis is present, with no evidence of aortic  valve stenosis. Aortic valve mean gradient measures 5.0 mmHg.   US  carotid B/L 11/2023 IMPRESSION: 1. Left carotid artery stent is patent without significant stenosis. 2. Minimal plaque at the right carotid bulb. No significant stenosis in the right internal carotid artery. 3. Vertebral arteries are patent with antegrade flow.   Echo 11/2022 1. Left ventricular ejection fraction, by estimation, is 60 to 65%. The  left ventricle has normal  function. The left ventricle has no regional  wall motion abnormalities. Left ventricular diastolic parameters are  consistent with Grade I diastolic  dysfunction (impaired relaxation).   2. Right ventricular systolic function is normal. The right ventricular  size is normal. Tricuspid regurgitation  signal is inadequate for assessing  PA pressure.   3. The mitral valve is normal in structure. No evidence of mitral valve  regurgitation. No evidence of mitral stenosis.   4. The aortic valve is normal in structure. Aortic valve regurgitation is  not visualized. No aortic stenosis is present.   5. The inferior vena cava is normal in size with greater than 50%  respiratory variability, suggesting right atrial pressure of 3 mmHg.    R/L heart cath 11/2022 Conclusions: No critical coronary artery disease.  The is up to ~50% stenosis at the ostium of the RCA with pressure dampening noted using 73F diagnostic catheter.  Slight improvement noted with intracoronary nitroglycerin  suggesting at least some component of vasospasm.  No angiographically significant coronary artery disease noted in the left coronary artery. Normal left and right heart filling pressures. Normal Fick cardiac output/index. Small right radial artery with significant vasospasm throughout the procedure.  Recommend alternate access for future catheterizations.   Recommendations: Add isosorbide  mononitrate 15 mg daily and as needed sublingual nitroglycerin  for antianginal therapy, including possible coronary vasospasm. Secondary prevention of ASCVD. Maintain net even fluid balance.   Lonni Hanson, MD [facility]   Physical Exam VS:  BP 120/60 (BP Location: Left Arm, Patient Position: Sitting, Cuff Size: Normal)   Pulse 74   Ht 5' 3 (1.6 m)   Wt 145 lb 6 oz (65.9 kg)   SpO2 98%   BMI 25.75 kg/m        Wt Readings from Last 3 Encounters:  04/10/24 145 lb 6 oz (65.9 kg)  04/05/24 149 lb (67.6 kg)  04/03/24 149 lb (67.6 kg)    GEN: Well nourished, well developed in no acute distress NECK: No JVD; No carotid bruits CARDIAC: RRR, no murmurs, rubs, gallops RESPIRATORY:  Clear to auscultation without rales, wheezing or rhonchi  ABDOMEN: Soft, non-tender, non-distended EXTREMITIES:  No edema; No deformity    ASSESSMENT AND PLAN  Chest pain Nonobstructive CAD Admission in June 2025 for chest pain and went to the ER again 04/05/24. Work-up was overall remarkable. LHC in 2024 showed nonobstructive CAD with 50% in the ostium of the RCA, may have component of vasospasm. She reports worsening sharp chest pain that radiates across her chest and is worse with exertion.  She also reports chronic SOB. She did take SL NTG  one time with improvement of pain. I will increase Imdur  to 30mg  daily.  Patient would like to pursue LHC, so we will get this ordered. Continue ASA, Plavix , toprol , SL NTG  She has a dye allergy and would like to be monitored at the hospital during pre-procedure dye allergy prophylaxis. I will order same-day prophylaxis. Chart says she has allergy to prednisone  which elevated blood sugars. I let the patient know any steroid we use will cause a rise in blood sugars. Patient voiced she understood this.Will need to use alternative access other than right radial artery due to significant vasospasms.  We will see her back after the heart cath.   HTN BP good today. Continue Losartan  25mg  daily, Toprol  12.5mg  daily. Increase Imdur  as above.   HFpEF The patient is euvolemic on exam. She reports chronic SOB that is unchanged. Continue Torsemide  20mg  daily.  HLD LDL 126, TG 186, TC 217, HDL 53. Continue Crestor  20mg  daily.   Bilateral carotid disease Carotid US  showed patent stent on the left side and minimal plaque on the right side.      Cardiac Rehabilitation Eligibility Assessment      Informed Consent   Shared Decision Making/Informed Consent The risks [stroke (1 in 1000), death (1 in 1000), kidney failure [usually temporary] (1 in 500), bleeding (1 in 200), allergic reaction [possibly serious] (1 in 200)], benefits (diagnostic support and management of coronary artery disease) and alternatives of a cardiac catheterization were discussed in detail with Ms. Minervini and she is willing to  proceed.     Dispo: Follow-up in 3 weeks  Signed, Chanceler Pullin VEAR Fishman, PA-C

## 2024-04-10 NOTE — Patient Instructions (Signed)
 Medication Instructions:   Your physician recommends the following medication changes.   INCREASE: IMDUR  from 15 mg to 30 mg by mouth daily  *If you need a refill on your cardiac medications before your next appointment, please call your pharmacy*  Lab Work: Your provider would like for you to have following labs drawn today BMET, CBC.    If you have labs (blood work) drawn today and your tests are completely normal, you will receive your results only by: MyChart Message (if you have MyChart) OR A paper copy in the mail If you have any lab test that is abnormal or we need to change your treatment, we will call you to review the results.  Testing/Procedures:   [place] A DEPT OF [facility] [place] [address] [place] [address] Dept: [phone] Loc: [phone]  Supriya Beaston  04/10/2024  You are scheduled for a Cardiac Catheterization on Monday, July 28 with Dr. Deatrice Cage.  1. Please arrive at the [facility] Entrance of [facility], [address], [place] [address] at 10:00 AM (This is 1 hour(s) prior to your procedure time).  Proceed to the Check-In Desk directly inside the entrance.  Procedure Parking: Use the entrance off of the [address] side of the hospital. Turn right upon entering and follow the driveway to parking that is directly in front of the Heart & Vascular Center. There is no valet parking available at this entrance, however there is an awning directly in front of the Heart & Vascular Center for drop off/ pick up for patients.  Special note: Every effort is made to have your procedure done on time. Please understand that emergencies sometimes delay scheduled procedures.  2. Diet: Do not eat solid foods after midnight.  The patient may have clear liquids until 5am upon the day of the procedure.  3.  Contrast Allergy: Yes, Please take  Prednisone  50mg  by mouth at: Thirteen hours prior to cath 9:00pm on Sunday Seven hours prior to cath 4:00am on Monday And prior to leaving home please take last dose of Prednisone  50mg  and Benadryl  50mg  by mouth.  HOLD DIURETICS (TORSEMIDE ) MORNING OF PROCEDURE  HOLD ALL DIABETIC MEDICATIONS THE MORNING OF PROCEDURE  On the morning of your procedure, take your Aspirin  81 mg and Plavix /Clopidogrel  and any morning medicines NOT listed above.  You may use sips of water.  4, Plan to go home the same day, you will only stay overnight if medically necessary. 5. Bring a current list of your medications and current insurance cards. 6. You MUST have a responsible person to drive you home. 7 Someone MUST be with you the first 24 hours after you arrive home or your discharge will be delayed. 8. Please wear clothes that are easy to get on and off and wear slip-on shoes.  Thank you for allowing us  to care for you!   -- [facility]   Follow-Up: At [name], you and your health needs are our priority.  As part of our continuing mission to provide you with exceptional heart care, our providers are all part of one team.  This team includes your primary Cardiologist (physician) and Advanced Practice Providers or APPs (Physician Assistants and Nurse Practitioners) who all work together to provide you with the care you need, when you need it.  Your next appointment:    2 week(s)  (post catherization)  after April 22, 2024  Provider:   You may see Timothy Gollan, MD or one of the following Advanced Practice Providers on your designated Care Team:    Cadence [place], PA-C

## 2024-04-10 NOTE — Progress Notes (Signed)
 Cardiology Office Note   Date:  04/10/2024  ID:  Cassandra Benson, DOB 10-11-1940, MRN [id number] PCP: Rilla Baller, MD  [facility] Providers Cardiologist:  Evalene Lunger, MD   History of Present Illness Cassandra Benson is a 83 y.o. female with a h/o HTN, HLD, hypothyroidism, nonobstructive CAD, fibromyalgia, PAD, Sjrogren's syndrome and heart failure with preserved EF, adjustment disorder, bilateral carotid disease who presents for ER follow-up for chest pain.    2000 LHC without significant disease. 03/2019 MPI without ischemia and ruled a low risk scan. 07/2020 echo with EF 55 to 60%, G2 DD. 10/2019 Cardiac CTA with no CAD. 11/2022 R/L heart cath with no critical artery disease, ~50% stenosis af the ostium of the RCA, no significant CAD in the Left coronary artery, normal L&R filling pressures, normal CO/CI, possible component of vasospasm. Echo 3/204 LVEF 60-65%  Patient was last seen 02/2023 and was overall stable from a cardiac perspective.   The patient was admitted 11/2023 for near syncope/syncope due to orthostatic hypotension. Echo during admission 3/025 showed LVEF 60-65%, no WMA, G1DD.  The patient was admitted in June 2025 for chest pain felt to be from costochondritis. Hs troponin negative.   She was seen in the ER 04/03/24 for dehydration and elevated BG. She was given IVF  She went to the ER 04/05/24 for chest pain (same as prior admission). HS trop was negative. She was discharged home and told to follow-up with cardiology as outpatient.   Today, the patient is very distraught given multiple hospital visits for chest pain, and feeling her complaints were being ignored. She reports significant family history of CAD. She reports sharp chest pain that goes across the chest and into the shoulder. She does notice it is worse with exertion. It occurred 4 times before going to the ER. She has chronic SOB, that may be from lungs. She denies lower leg edema.     Studies Reviewed EKG Interpretation Date/Time:  Wednesday April 10 2024 14:42:16 EDT Ventricular Rate:  74 PR Interval:  184 QRS Duration:  68 QT Interval:  382 QTC Calculation: 424 R Axis:   3  Text Interpretation: Normal sinus rhythm Low voltage QRS When compared with ECG of 05-Apr-2024 12:31, Nonspecific T wave abnormality now evident in Lateral leads Confirmed by Franchester, Chanti Golubski ([id number]) on 04/10/2024 2:46:14 PM    Echo 11/2023 1. Left ventricular ejection fraction, by estimation, is 60 to 65%. Left  ventricular ejection fraction by 2D MOD biplane is 61.9 %. The left  ventricle has normal function. The left ventricle has no regional wall  motion abnormalities. Left ventricular  diastolic parameters are consistent with Grade I diastolic dysfunction  (impaired relaxation).   2. Right ventricular systolic function is normal. The right ventricular  size is normal. There is normal pulmonary artery systolic pressure.   3. The mitral valve is normal in structure. No evidence of mitral valve  regurgitation.   4. The aortic valve is tricuspid. Aortic valve regurgitation is not  visualized. Aortic valve sclerosis is present, with no evidence of aortic  valve stenosis. Aortic valve mean gradient measures 5.0 mmHg.   US  carotid B/L 11/2023 IMPRESSION: 1. Left carotid artery stent is patent without significant stenosis. 2. Minimal plaque at the right carotid bulb. No significant stenosis in the right internal carotid artery. 3. Vertebral arteries are patent with antegrade flow.   Echo 11/2022 1. Left ventricular ejection fraction, by estimation, is 60 to 65%. The  left ventricle has normal  function. The left ventricle has no regional  wall motion abnormalities. Left ventricular diastolic parameters are  consistent with Grade I diastolic  dysfunction (impaired relaxation).   2. Right ventricular systolic function is normal. The right ventricular  size is normal. Tricuspid regurgitation  signal is inadequate for assessing  PA pressure.   3. The mitral valve is normal in structure. No evidence of mitral valve  regurgitation. No evidence of mitral stenosis.   4. The aortic valve is normal in structure. Aortic valve regurgitation is  not visualized. No aortic stenosis is present.   5. The inferior vena cava is normal in size with greater than 50%  respiratory variability, suggesting right atrial pressure of 3 mmHg.    R/L heart cath 11/2022 Conclusions: No critical coronary artery disease.  The is up to ~50% stenosis at the ostium of the RCA with pressure dampening noted using 73F diagnostic catheter.  Slight improvement noted with intracoronary nitroglycerin  suggesting at least some component of vasospasm.  No angiographically significant coronary artery disease noted in the left coronary artery. Normal left and right heart filling pressures. Normal Fick cardiac output/index. Small right radial artery with significant vasospasm throughout the procedure.  Recommend alternate access for future catheterizations.   Recommendations: Add isosorbide  mononitrate 15 mg daily and as needed sublingual nitroglycerin  for antianginal therapy, including possible coronary vasospasm. Secondary prevention of ASCVD. Maintain net even fluid balance.   Lonni Hanson, MD [facility]   Physical Exam VS:  BP 120/60 (BP Location: Left Arm, Patient Position: Sitting, Cuff Size: Normal)   Pulse 74   Ht 5' 3 (1.6 m)   Wt 145 lb 6 oz (65.9 kg)   SpO2 98%   BMI 25.75 kg/m        Wt Readings from Last 3 Encounters:  04/10/24 145 lb 6 oz (65.9 kg)  04/05/24 149 lb (67.6 kg)  04/03/24 149 lb (67.6 kg)    GEN: Well nourished, well developed in no acute distress NECK: No JVD; No carotid bruits CARDIAC: RRR, no murmurs, rubs, gallops RESPIRATORY:  Clear to auscultation without rales, wheezing or rhonchi  ABDOMEN: Soft, non-tender, non-distended EXTREMITIES:  No edema; No deformity    ASSESSMENT AND PLAN  Chest pain Nonobstructive CAD Admission in June 2025 for chest pain and went to the ER again 04/05/24. Work-up was overall remarkable. LHC in 2024 showed nonobstructive CAD with 50% in the ostium of the RCA, may have component of vasospasm. She reports worsening sharp chest pain that radiates across her chest and is worse with exertion.  She also reports chronic SOB. She did take SL NTG  one time with improvement of pain. I will increase Imdur  to 30mg  daily.  Patient would like to pursue LHC, so we will get this ordered. Continue ASA, Plavix , toprol , SL NTG  She has a dye allergy and would like to be monitored at the hospital during pre-procedure dye allergy prophylaxis. I will order same-day prophylaxis. Chart says she has allergy to prednisone  which elevated blood sugars. I let the patient know any steroid we use will cause a rise in blood sugars. Patient voiced she understood this.Will need to use alternative access other than right radial artery due to significant vasospasms.  We will see her back after the heart cath.   HTN BP good today. Continue Losartan  25mg  daily, Toprol  12.5mg  daily. Increase Imdur  as above.   HFpEF The patient is euvolemic on exam. She reports chronic SOB that is unchanged. Continue Torsemide  20mg  daily.  HLD LDL 126, TG 186, TC 217, HDL 53. Continue Crestor  20mg  daily.   Bilateral carotid disease Carotid US  showed patent stent on the left side and minimal plaque on the right side.      Cardiac Rehabilitation Eligibility Assessment      Informed Consent   Shared Decision Making/Informed Consent The risks [stroke (1 in 1000), death (1 in 1000), kidney failure [usually temporary] (1 in 500), bleeding (1 in 200), allergic reaction [possibly serious] (1 in 200)], benefits (diagnostic support and management of coronary artery disease) and alternatives of a cardiac catheterization were discussed in detail with Ms. Minervini and she is willing to  proceed.     Dispo: Follow-up in 3 weeks  Signed, Chanceler Pullin VEAR Fishman, PA-C

## 2024-04-11 ENCOUNTER — Telehealth: Payer: Self-pay

## 2024-04-11 NOTE — Telephone Encounter (Signed)
Spoke with pt relaying Dr. G's message. Pt verbalizes understanding.  

## 2024-04-11 NOTE — Telephone Encounter (Signed)
 Noted planned [facility] 04/22/2024. Please have her keep appt with me on 04/16/2024 and bring in sugar log to review (I previously provided her with glucose meter log to monitor sugars and insulin  use).

## 2024-04-11 NOTE — Telephone Encounter (Signed)
 Copied from CRM [id number]. Topic: General - Other >> Apr 10, 2024  6:01 PM Jayma L wrote: Reason for CRM: patient called to stated she is having some surgery soon

## 2024-04-15 ENCOUNTER — Telehealth: Payer: Self-pay | Admitting: Cardiovascular Disease

## 2024-04-15 NOTE — Telephone Encounter (Signed)
 Pt c/o medication issue:  1. Name of Medication: predniSONE  (DELTASONE ) 50 MG tablet   2. How are you currently taking this medication (dosage and times per day)? Take 1 tablet by mouth 13 hrs prior to test, another tablet 7 hours prior, and last dose 1 hour prior.   3. Are you having a reaction (difficulty breathing--STAT)?  No  4. What is your medication issue? Patient stated she has taken this medication in the past and her blood sugar was over 600 and was in the hospital for treatment. Patient would like to know if there would be another option for her. Please advise.

## 2024-04-16 ENCOUNTER — Encounter: Payer: Self-pay | Admitting: Family Medicine

## 2024-04-16 ENCOUNTER — Telehealth: Payer: Self-pay

## 2024-04-16 ENCOUNTER — Ambulatory Visit (INDEPENDENT_AMBULATORY_CARE_PROVIDER_SITE_OTHER): Admitting: Family Medicine

## 2024-04-16 VITALS — BP 138/64 | HR 79 | Temp 98.2°F | Ht 63.0 in | Wt 144.1 lb

## 2024-04-16 DIAGNOSIS — E13319 Other specified diabetes mellitus with unspecified diabetic retinopathy without macular edema: Secondary | ICD-10-CM

## 2024-04-16 DIAGNOSIS — T380X5A Adverse effect of glucocorticoids and synthetic analogues, initial encounter: Secondary | ICD-10-CM | POA: Diagnosis not present

## 2024-04-16 DIAGNOSIS — I5032 Chronic diastolic (congestive) heart failure: Secondary | ICD-10-CM | POA: Diagnosis not present

## 2024-04-16 DIAGNOSIS — R079 Chest pain, unspecified: Secondary | ICD-10-CM | POA: Diagnosis not present

## 2024-04-16 DIAGNOSIS — G8929 Other chronic pain: Secondary | ICD-10-CM

## 2024-04-16 DIAGNOSIS — G3184 Mild cognitive impairment, so stated: Secondary | ICD-10-CM | POA: Diagnosis not present

## 2024-04-16 DIAGNOSIS — Z794 Long term (current) use of insulin: Secondary | ICD-10-CM | POA: Diagnosis not present

## 2024-04-16 MED ORDER — FUROSEMIDE 20 MG PO TABS: 20.0000 mg | ORAL_TABLET | Freq: Every day | ORAL | 3 refills | Status: AC

## 2024-04-16 NOTE — Progress Notes (Unsigned)
 Ph: [phone] Fax: [phone]   Patient ID: Cassandra Benson, female    DOB: 1940-12-21, 83 y.o.   MRN: [id number]  This visit was conducted in person.  BP 138/64   Pulse 79   Temp 98.2 F (36.8 C) (Oral)   Ht 5' 3 (1.6 m)   Wt 144 lb 2 oz (65.4 kg)   SpO2 97%   BMI 25.53 kg/m    CC: diabetes follow up  Subjective:   HPI: Cassandra Benson is a 83 y.o. female presenting on 04/16/2024 for Medical Management of Chronic Issues (Pt wants to discuss medication allergies to Torsemide  //)   Current diabetic regimen: Tresiba  55u nightly at 10pm (pens) Lispro (Humalog ) 5u before meals (pens)  Brought sugar log from 6/25 to 04/02/2024 which was reviewed.  She's actually been taking tresiba  56 units and lispro 7u with meals.   Fasting sugars ranging from 200s to 500s,  Has not consistently documented insulin  administration or frequent sugars except am fasting readings.     2 separate ER visits for hyperglycemia, shortness of breath, chest pain.  Saw cards in f/u, planned [facility] 04/22/2024 [place]).  She will need to be premedicated with prednisone  50mg  x3 doses. Discussed this as well as concerns over steroid induced hyperglycemia - she has previously not done well after this.   She is predominantly concerned today about allergy to torsemide  - brings list of side effects she is having which she attributes to this including tinnitus, hyperglycemia, lightheaded, weakness, stiffness, nose dripping.  She's been on torsemide  for the past 2 yrs and states sugars have been high since then. Torsemide  replaced furosemide  20mg  ~03/2023.      Relevant past medical, surgical, family and social history reviewed and updated as indicated. Interim medical history since our last visit reviewed. Allergies and medications reviewed and updated. Outpatient Medications Prior to Visit  Medication Sig Dispense Refill   acetaminophen  (TYLENOL ) 500 MG tablet Take 500 mg by mouth every 6 (six)  hours as needed for mild pain (pain score 1-3) or headache.     albuterol  (VENTOLIN  HFA) 108 (90 Base) MCG/ACT inhaler Inhale 2 puffs into the lungs every 6 (six) hours as needed. 8 g 2   Ascorbic Acid  (VITAMIN C ) 100 MG tablet Take 100 mg by mouth daily.     aspirin  EC 81 MG tablet Take 1 tablet (81 mg total) by mouth daily. Swallow whole. 30 tablet 12   Blood Glucose Monitoring Suppl DEVI 1 each by Does not apply route in the morning, at noon, and at bedtime. E13.319. May substitute to any manufacturer covered by patient's insurance. 1 each 0   Cholecalciferol  (VITAMIN D3) 25 MCG (1000 UT) CAPS Take 2 capsules (2,000 Units total) by mouth daily.     clopidogrel  (PLAVIX ) 75 MG tablet TAKE 1 TABLET(75 MG) BY MOUTH EVERY EVENING 30 tablet 11   diclofenac  Sodium (VOLTAREN ) 1 % GEL Apply 2 g topically 3 (three) times daily. (Patient not taking: Reported on 04/17/2024) 100 g 1   diphenhydrAMINE  (BENADRYL ) 50 MG tablet Take 1 tablet (50 mg total) by mouth once for 1 dose. 1 hour prior to test 1 tablet 0   donepezil  (ARICEPT ) 10 MG tablet Take 1 tablet (10 mg total) by mouth at bedtime. For memory (Patient not taking: Reported on 04/17/2024) 30 tablet 6   Fluticasone-Umeclidin-Vilant (TRELEGY ELLIPTA ) 100-62.5-25 MCG/ACT AEPB Inhale 1 puff into the lungs daily. 28 each 11   Glucagon , rDNA, (GLUCAGON  EMERGENCY) 1 MG KIT  INJECT into THE muscle ONCE AS NEEDED FOR emergency 1 kit 12   glucose blood (ONETOUCH ULTRA) test strip USE THREE TIMES DAILY 100 strip 3   isosorbide  mononitrate (IMDUR ) 30 MG 24 hr tablet Take 1 tablet (30 mg total) by mouth daily. 90 tablet 3   Multiple Vitamin (MULTIVITAMIN ADULT) TABS Take 1 tablet by mouth in the morning and at bedtime.     Multiple Vitamins-Minerals (PRESERVISION AREDS PO) Take 1 tablet by mouth in the morning and at bedtime.     nitroGLYCERIN  (NITROSTAT ) 0.4 MG SL tablet Place 1 tablet (0.4 mg total) under the tongue every 5 (five) minutes as needed for chest pain. 25  tablet PRN   predniSONE  (DELTASONE ) 50 MG tablet Take 1 tablet by mouth 13 hrs prior to test, another tablet 7 hours prior, and last dose 1 hour prior. 3 tablet 0   rosuvastatin  (CRESTOR ) 20 MG tablet Take 1 tablet (20 mg total) by mouth every evening. 90 tablet 4   SYNTHROID  75 MCG tablet TAKE ONE TABLET BY MOUTH Monday-Saturday BEFORE breakfast 80 tablet 4   topiramate  (TOPAMAX ) 25 MG tablet Take 1 tablet (25 mg total) by mouth at bedtime. For headache prevention (Patient not taking: Reported on 04/17/2024) 30 tablet 6   carbamazepine (CARBATROL) 100 MG 12 hr capsule Take 1 capsule (100 mg total) by mouth 2 (two) times daily. For trigeminal neuralgia     insulin  degludec (TRESIBA  FLEXTOUCH) 100 UNIT/ML FlexTouch Pen Inject 55 Units into the skin at bedtime. (Patient taking differently: Inject 56 Units into the skin at bedtime.) 60 mL 3   insulin  lispro (HUMALOG ) 100 UNIT/ML injection Inject 0-15 Units into the skin 3 (three) times daily before meals. Dose per sliding scale     lidocaine  (LIDODERM ) 5 % Place 1 patch onto the skin daily. Remove & Discard patch within 12 hours or as directed by MD 30 patch 0   losartan  (COZAAR ) 50 MG tablet Take 0.5 tablets (25 mg total) by mouth daily.     metoprolol  succinate (TOPROL -XL) 25 MG 24 hr tablet Take 0.5 tablets (12.5 mg total) by mouth daily. 45 tablet 3   torsemide  (DEMADEX ) 20 MG tablet Take 1 tablet (20 mg total) by mouth daily. 60 tablet 5   insulin  degludec (TRESIBA  FLEXTOUCH) 100 UNIT/ML FlexTouch Pen Inject 60 Units into the skin at bedtime.     insulin  lispro (HUMALOG ) 100 UNIT/ML injection Inject 0.07 mLs (7 Units total) into the skin 3 (three) times daily before meals. Dose per sliding scale     No facility-administered medications prior to visit.     Per HPI unless specifically indicated in ROS section below Review of Systems  Objective:  BP 138/64   Pulse 79   Temp 98.2 F (36.8 C) (Oral)   Ht 5' 3 (1.6 m)   Wt 144 lb 2 oz (65.4  kg)   SpO2 97%   BMI 25.53 kg/m   Wt Readings from Last 3 Encounters:  04/16/24 144 lb 2 oz (65.4 kg)  04/10/24 145 lb 6 oz (65.9 kg)  04/05/24 149 lb (67.6 kg)      Physical Exam Vitals and nursing note reviewed.  Constitutional:      Appearance: Normal appearance. She is not ill-appearing.  HENT:     Head: Normocephalic and atraumatic.     Mouth/Throat:     Mouth: Mucous membranes are moist.     Pharynx: Oropharynx is clear. No oropharyngeal exudate or posterior oropharyngeal erythema.  Eyes:  Extraocular Movements: Extraocular movements intact.     Pupils: Pupils are equal, round, and reactive to light.  Cardiovascular:     Rate and Rhythm: Normal rate and regular rhythm.     Pulses: Normal pulses.     Heart sounds: Normal heart sounds. No murmur heard. Pulmonary:     Effort: Pulmonary effort is normal. No respiratory distress.     Breath sounds: Normal breath sounds. No wheezing, rhonchi or rales.  Musculoskeletal:     Right lower leg: No edema.     Left lower leg: No edema.  Skin:    General: Skin is warm and dry.     Findings: No rash.  Neurological:     Mental Status: She is alert.  Psychiatric:        Mood and Affect: Mood normal.        Behavior: Behavior normal.       Results for orders placed or performed during the hospital encounter of 04/10/24  Basic metabolic panel with GFR   Collection Time: 04/10/24  5:06 PM  Result Value Ref Range   Sodium 138 135 - 145 mmol/L   Potassium 3.6 3.5 - 5.1 mmol/L   Chloride 104 98 - 111 mmol/L   CO2 24 22 - 32 mmol/L   Glucose, Bld 252 (H) 70 - 99 mg/dL   BUN 17 8 - 23 mg/dL   Creatinine, Ser 9.08 0.44 - 1.00 mg/dL   Calcium  9.5 8.9 - 10.3 mg/dL   GFR, Estimated >39 >39 mL/min   Anion gap 10 5 - 15  CBC   Collection Time: 04/10/24  5:06 PM  Result Value Ref Range   WBC 8.6 4.0 - 10.5 K/uL   RBC 4.72 3.87 - 5.11 MIL/uL   Hemoglobin 13.0 12.0 - 15.0 g/dL   HCT 60.6 63.9 - 53.9 %   MCV 83.3 80.0 - 100.0 fL    MCH 27.5 26.0 - 34.0 pg   MCHC 33.1 30.0 - 36.0 g/dL   RDW 86.3 88.4 - 84.4 %   Platelets 211 150 - 400 K/uL   nRBC 0.0 0.0 - 0.2 %   *Note: Due to a large number of results and/or encounters for the requested time period, some results have not been displayed. A complete set of results can be found in Results Review.   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  This is first time she brings in more frequent sugar readings (fasting over 2 weeks) showing persistent am hyperglycemia. With these readings, I think reasonable to titrate tresiba  up to 60 u daily (~10% increase in dose).  Rec return in 2 weeks with another sugar log to review readings/insulin  dosing. I again asked her to check sugars QID before meals and at bedtime and to record how much insulin  she is self administering. She has sugar logs available at home      Relevant Medications   insulin  lispro (HUMALOG ) 100 UNIT/ML injection   insulin  degludec (TRESIBA  FLEXTOUCH) 100 UNIT/ML FlexTouch Pen   Chronic chest pain   Has had 2 ER visits recently for this, pending LHC ([place]). Previously though coronary vasospasm contributing.  She states she has not  been taking isosorbide  30mg  - encouraged she start this as has prescription available at pharmacy through cardiology       Steroid-induced hyperglycemia   She has upcoming LHC. She has contrast allergy (itchy rash). Planning to premedicate with benadryl  and prednisone  (50mg  TID prior to procedure). Other premedication steroid option methylprednisolone  would likely lead to more hyperglycemia.  Previous steroid use (IM decadron ) led to severe hyperglycemia with ER visit.  Will  suggest titration in insulin  dosing (~30% basal and ~70% bolus) as per phone note.       Chronic diastolic heart failure (HCC)   Patient is convinced that the torsemide  is contributing to many of her symptoms including hyperglycemia and weakness and states she has stopped this medication. I am not convinced but will start furosemide  20mg  daily in its place.       Relevant Medications   furosemide  (LASIX ) 20 MG tablet   MCI (mild cognitive impairment) with memory loss   Has not completed recommended neurocognitive evaluation.         Meds ordered this encounter  Medications   furosemide  (LASIX ) 20 MG tablet    Sig: Take 1 tablet (20 mg total) by mouth daily.    Dispense:  30 tablet    Refill:  3    To replace torsemide     No orders of the defined types were placed in this encounter.   Patient Instructions  Stop torsemide . Start in its place furosemide  20mg  daily.  Increase Tresiba  nightly basal insulin  to 60 units nightly.  Continue monitoring blood sugars with log provided  Return in 2 weeks with log to again review sugar control.   New diabetic regimen: Tresiba  60u nightly at 10pm (pens) Lispro (Humalog ) 7u before meals (pens)    Follow up plan: Return in about 2 weeks (around 04/30/2024) for follow up visit.  Anton Blas, MD  r

## 2024-04-16 NOTE — Telephone Encounter (Signed)
 Left message to return call to our office.

## 2024-04-16 NOTE — Telephone Encounter (Signed)
 Copied from CRM [id number]. Topic: General - Call Back - No Documentation >> Apr 15, 2024  5:26 PM Deleta S wrote: Reason for CRM: patient received a missed call no message was left patient would like a call back at [phone] or [phone].

## 2024-04-16 NOTE — Patient Instructions (Addendum)
 Stop torsemide . Start in its place furosemide  20mg  daily.  Increase Tresiba  nightly basal insulin  to 60 units nightly.  Continue monitoring blood sugars with log provided  Return in 2 weeks with log to again review sugar control.   New diabetic regimen: Tresiba  60u nightly at 10pm (pens) Lispro (Humalog ) 7u before meals (pens)

## 2024-04-17 NOTE — Progress Notes (Unsigned)
 Complex Care Management Note Care Guide Note  04/17/2024 Name: Cassandra Benson MRN: [id number] DOB: 03-24-1941   Complex Care Management Outreach Attempts: A second unsuccessful outreach was attempted today to offer the patient with information about available complex care management services.  Follow Up Plan:  Additional outreach attempts will be made to offer the patient complex care management information and services.   Encounter Outcome:  No Answer  Dreama [facility]wood [facility]  [facility], [facility] Health Care Management Assistant Direct Dial: [phone]  Fax: [phone]

## 2024-04-18 NOTE — Assessment & Plan Note (Addendum)
 Difficult to treat/titrate based on insufficient data and confusion over how she actually takes her insulin .  This is first time she brings in more frequent sugar readings (fasting over 2 weeks) showing persistent am hyperglycemia. With these readings, I think reasonable to titrate tresiba  up to 60 u daily (~10% increase in dose).  Rec return in 2 weeks with another sugar log to review readings/insulin  dosing. I again asked her to check sugars QID before meals and at bedtime and to record how much insulin  she is self administering. She has sugar logs available at home

## 2024-04-18 NOTE — Telephone Encounter (Signed)
 Please notify - she has been recommended prednisone  50mg  TID (total 150mg ) prior to [facility] tomorrow  She is currently on tresiba  60u nightly, and lispro insulin  7u with meals.   I recommend she increase her Tresiba  tomorrow morning only to 65 units and increase lispro mealtime insulin  to 10 units TID with meals over next 24 hours to help correct sugars, start this when she takes first prednisone  dose.   Then return to tresiba  60u nightly, and lispro insulin  7u with meals.

## 2024-04-18 NOTE — Assessment & Plan Note (Addendum)
 Has had 2 ER visits recently for this, pending LHC ([place]). Previously though coronary vasospasm contributing.  She states she has not been taking isosorbide  30mg  - encouraged she start this as has prescription available at pharmacy through cardiology

## 2024-04-18 NOTE — Telephone Encounter (Unsigned)
 Copied from CRM [id number]. Topic: General - Other >> Apr 18, 2024  8:36 AM [name] GRADE wrote: Reason for CRM: Patient is returning a call she received from Dr.Gutierrez, I tried to advise of the message that was left this morning but patient wants to speak with Dr.Gutierrez only. Called CAL and was informed to send a CRM.

## 2024-04-18 NOTE — Assessment & Plan Note (Signed)
 Patient is convinced that the torsemide  is contributing to many of her symptoms including hyperglycemia and weakness and states she has stopped this medication. I am not convinced but will start furosemide  20mg  daily in its place.

## 2024-04-18 NOTE — Progress Notes (Signed)
 Complex Care Management Care Guide Note  04/18/2024 Name: Cassandra Benson MRN: [id number] DOB: [birth year]  Cassandra Benson is a 83 y.o. year old female who is a primary care patient of Rilla Baller, MD and is actively engaged with the care management team. I reached out to Cassandra Benson by phone today to assist with re-scheduling  with the Licensed Clinical Child psychotherapist.  Follow up plan: Patient declined to reschedule at this time and will follow up with RNCM.  Cassandra [facility]  [facility], [facility] Health Care Management Assistant Direct Dial: [phone]  Fax: [phone]

## 2024-04-18 NOTE — Assessment & Plan Note (Signed)
 Has not completed recommended neurocognitive evaluation.

## 2024-04-18 NOTE — Assessment & Plan Note (Signed)
 She has upcoming LHC. She has contrast allergy (itchy rash). Planning to premedicate with benadryl  and prednisone  (50mg  TID prior to procedure). Other premedication steroid option methylprednisolone  would likely lead to more hyperglycemia.  Previous steroid use (IM decadron ) led to severe hyperglycemia with ER visit.  Will suggest titration in insulin  dosing (~30% basal and ~70% bolus) as per phone note.

## 2024-04-19 ENCOUNTER — Encounter: Payer: Self-pay | Admitting: *Deleted

## 2024-04-19 ENCOUNTER — Telehealth: Payer: Self-pay | Admitting: *Deleted

## 2024-04-19 NOTE — Telephone Encounter (Signed)
 Called to confirm/remind patient of their procedure.   Scheduled for: Left heart cath  [x]  Date 04/22/24  [x]  Time 11:00 am [x]  Arrival time 10:00 am [x]  Location [facility]   [x]  Designated Driver  [x]  Instructions, time, and location reviewed with patient    [x]  H&P within 30 days  [x]  EKG within 30 days  [x]  Orders  [x]  Labs  [x]  GFR >60  [x]  Diet  [x]  Medication instructions Reviewed. Contrast allergy   [x]  Spoke with patient and reviewed all information []  VM Full/unable to leave message  []  Phone not in service  Patient is allergic to contrast and has allergy to prednisone . She wanted to know if they would monitor her and advised that they would. Advised that Dr. Perla states that if she does not take the prednisone  then they will not do her heart cath. She again expressed being fearful but that she would take the medication because she needs the procedure. Provided emotional support and advised to call back if she should change her mind. She verbalized understanding.

## 2024-04-19 NOTE — Telephone Encounter (Signed)
 Verbal discussion with Dr. Gollan and he stated that if she does not premedicate then they will not do her heart cath. No alternative medication available. Will reach back out to the patient with this information.

## 2024-04-19 NOTE — Telephone Encounter (Signed)
 Called patient to review instructions for her scheduled heart cath for Monday 7/28. She expressed concerns about the prednisone . She reports severe allergy and that her blood sugars run extremely high which makes her fearful. She felt that nobody has been listening to her about her fears. She expressed multiple times about being frightful and that she felt we were being  dismissive. She continued to express her concerns and she spoke for 31 minutes.   In summary she is fearful taking prednisone  and wants to know if there is alternative. Advised that I would check with provider and then be in touch.

## 2024-04-19 NOTE — Telephone Encounter (Signed)
 Call could not be completed at this time.

## 2024-04-19 NOTE — Patient Instructions (Signed)
 SABRA

## 2024-04-19 NOTE — Telephone Encounter (Signed)
 I was out of the office yesterday and am just getting message now.  Called pt - straight to voicemail. Left message.

## 2024-04-22 ENCOUNTER — Other Ambulatory Visit: Payer: Self-pay

## 2024-04-22 ENCOUNTER — Encounter
Admission: RE | Disposition: A | Discharge status: Home / Self Care | Payer: Self-pay | Source: Home / Self Care | Attending: Cardiovascular Disease

## 2024-04-22 ENCOUNTER — Encounter: Payer: Self-pay | Admitting: Cardiovascular Disease

## 2024-04-22 ENCOUNTER — Ambulatory Visit
Admission: RE | Admit: 2024-04-22 | Discharge: 2024-04-22 | Disposition: A | Discharge status: Home / Self Care | Attending: Cardiovascular Disease | Admitting: Cardiovascular Disease

## 2024-04-22 DIAGNOSIS — R079 Chest pain, unspecified: Secondary | ICD-10-CM | POA: Diagnosis present

## 2024-04-22 DIAGNOSIS — I6523 Occlusion and stenosis of bilateral carotid arteries: Secondary | ICD-10-CM | POA: Insufficient documentation

## 2024-04-22 DIAGNOSIS — I25118 Atherosclerotic heart disease of native coronary artery with other forms of angina pectoris: Secondary | ICD-10-CM | POA: Diagnosis not present

## 2024-04-22 DIAGNOSIS — Z7902 Long term (current) use of antithrombotics/antiplatelets: Secondary | ICD-10-CM | POA: Insufficient documentation

## 2024-04-22 DIAGNOSIS — Z7982 Long term (current) use of aspirin: Secondary | ICD-10-CM | POA: Diagnosis not present

## 2024-04-22 DIAGNOSIS — M797 Fibromyalgia: Secondary | ICD-10-CM | POA: Insufficient documentation

## 2024-04-22 DIAGNOSIS — E785 Hyperlipidemia, unspecified: Secondary | ICD-10-CM | POA: Insufficient documentation

## 2024-04-22 DIAGNOSIS — Z91041 Radiographic dye allergy status: Secondary | ICD-10-CM | POA: Diagnosis not present

## 2024-04-22 DIAGNOSIS — Z8249 Family history of ischemic heart disease and other diseases of the circulatory system: Secondary | ICD-10-CM | POA: Insufficient documentation

## 2024-04-22 DIAGNOSIS — E039 Hypothyroidism, unspecified: Secondary | ICD-10-CM | POA: Insufficient documentation

## 2024-04-22 DIAGNOSIS — I2510 Atherosclerotic heart disease of native coronary artery without angina pectoris: Secondary | ICD-10-CM

## 2024-04-22 DIAGNOSIS — Z79899 Other long term (current) drug therapy: Secondary | ICD-10-CM | POA: Insufficient documentation

## 2024-04-22 DIAGNOSIS — I110 Hypertensive heart disease with heart failure: Secondary | ICD-10-CM | POA: Diagnosis not present

## 2024-04-22 DIAGNOSIS — I5032 Chronic diastolic (congestive) heart failure: Secondary | ICD-10-CM | POA: Insufficient documentation

## 2024-04-22 HISTORY — PX: LEFT HEART CATH AND CORONARY ANGIOGRAPHY: CATH118249

## 2024-04-22 LAB — GLUCOSE, CAPILLARY
Glucose-Capillary: 326 mg/dL — ABNORMAL HIGH (ref 70–99)
Glucose-Capillary: 290 mg/dL — ABNORMAL HIGH (ref 70–99)

## 2024-04-22 SURGERY — LEFT HEART CATH AND CORONARY ANGIOGRAPHY
Anesthesia: Moderate Sedation | Laterality: Left

## 2024-04-22 MED ORDER — DIPHENHYDRAMINE HCL 50 MG/ML IJ SOLN
INTRAMUSCULAR | Status: DC | PRN
  Administered 2024-04-22: 25 mg via INTRAVENOUS

## 2024-04-22 MED ORDER — SODIUM CHLORIDE 0.9 % WEIGHT BASED INFUSION: 3.0000 mL/kg/h | INTRAVENOUS | Status: DC

## 2024-04-22 MED ORDER — IOHEXOL 300 MG/ML  SOLN
INTRAMUSCULAR | Status: DC | PRN
  Administered 2024-04-22: 50 mL

## 2024-04-22 MED ORDER — SODIUM CHLORIDE 0.9 % IV SOLN: 250.0000 mL | INTRAVENOUS | Status: DC | PRN

## 2024-04-22 MED ORDER — MIDAZOLAM HCL 2 MG/2ML IJ SOLN
INTRAMUSCULAR | Status: DC | PRN
  Administered 2024-04-22: 1 mg via INTRAVENOUS

## 2024-04-22 MED ORDER — FENTANYL CITRATE (PF) 100 MCG/2ML IJ SOLN
INTRAMUSCULAR | Status: DC | PRN
  Administered 2024-04-22: 25 ug via INTRAVENOUS

## 2024-04-22 MED ORDER — SODIUM CHLORIDE 0.9% FLUSH: 3.0000 mL | INTRAVENOUS | Status: DC | PRN

## 2024-04-22 MED ORDER — MIDAZOLAM HCL 2 MG/2ML IJ SOLN
INTRAMUSCULAR | Status: AC
  Filled 2024-04-22: qty 2

## 2024-04-22 MED ORDER — ACETAMINOPHEN 325 MG PO TABS: 650.0000 mg | ORAL_TABLET | ORAL | Status: DC | PRN

## 2024-04-22 MED ORDER — SODIUM CHLORIDE 0.9 % WEIGHT BASED INFUSION: 1.0000 mL/kg/h | INTRAVENOUS | Status: DC

## 2024-04-22 MED ORDER — METHYLPREDNISOLONE SODIUM SUCC 125 MG IJ SOLR: 40.0000 mg | INTRAMUSCULAR | Status: DC

## 2024-04-22 MED ORDER — LIDOCAINE HCL 1 % IJ SOLN
INTRAMUSCULAR | Status: AC
  Filled 2024-04-22: qty 20

## 2024-04-22 MED ORDER — SODIUM CHLORIDE 0.9% FLUSH: 3.0000 mL | Freq: Two times a day (BID) | INTRAVENOUS | Status: DC

## 2024-04-22 MED ORDER — ONDANSETRON HCL 4 MG/2ML IJ SOLN: 4.0000 mg | Freq: Four times a day (QID) | INTRAMUSCULAR | Status: DC | PRN

## 2024-04-22 MED ORDER — DIPHENHYDRAMINE HCL 50 MG/ML IJ SOLN
INTRAMUSCULAR | Status: AC
  Filled 2024-04-22: qty 1

## 2024-04-22 MED ORDER — SODIUM CHLORIDE 0.9 % WEIGHT BASED INFUSION
3.0000 mL/kg/h | INTRAVENOUS | Status: AC
  Administered 2024-04-22: 3 mL/kg/h via INTRAVENOUS

## 2024-04-22 MED ORDER — DIPHENHYDRAMINE HCL 50 MG/ML IJ SOLN: 50.0000 mg | Freq: Once | INTRAMUSCULAR | Status: DC

## 2024-04-22 MED ORDER — HEPARIN SODIUM (PORCINE) 1000 UNIT/ML IJ SOLN
INTRAMUSCULAR | Status: AC
  Filled 2024-04-22: qty 10

## 2024-04-22 MED ORDER — VERAPAMIL HCL 2.5 MG/ML IV SOLN
INTRAVENOUS | Status: AC
  Filled 2024-04-22: qty 2

## 2024-04-22 MED ORDER — ASPIRIN 81 MG PO CHEW: 81.0000 mg | CHEWABLE_TABLET | ORAL | Status: DC

## 2024-04-22 MED ORDER — DIPHENHYDRAMINE HCL 25 MG PO CAPS: 50.0000 mg | ORAL_CAPSULE | Freq: Once | ORAL | Status: DC

## 2024-04-22 MED ORDER — LIDOCAINE HCL (PF) 1 % IJ SOLN
INTRAMUSCULAR | Status: DC | PRN
Start: 2024-04-22 — End: 2024-04-22
  Administered 2024-04-22: 10 mL

## 2024-04-22 MED ORDER — HEPARIN (PORCINE) IN NACL 1000-0.9 UT/500ML-% IV SOLN
INTRAVENOUS | Status: DC | PRN
  Administered 2024-04-22: 500 mL

## 2024-04-22 MED ORDER — HEPARIN (PORCINE) IN NACL 1000-0.9 UT/500ML-% IV SOLN
INTRAVENOUS | Status: AC
  Filled 2024-04-22: qty 1000

## 2024-04-22 MED ORDER — FENTANYL CITRATE (PF) 100 MCG/2ML IJ SOLN
INTRAMUSCULAR | Status: AC
  Filled 2024-04-22: qty 2

## 2024-04-22 SURGICAL SUPPLY — 11 items
CATH INFINITI 5FR MULTPACK ANG (CATHETERS) IMPLANT
DEVICE CLOSURE MYNXGRIP 5F (Vascular Products) IMPLANT
DRAPE BRACHIAL (DRAPES) IMPLANT
GLIDESHEATH SLEND SS 6F .021 (SHEATH) IMPLANT
GUIDEWIRE INQWIRE 1.5J.035X260 (WIRE) IMPLANT
KIT MICROPUNCTURE VSI 5F STIFF (SHEATH) IMPLANT
PACK CARDIAC CATH (CUSTOM PROCEDURE TRAY) ×1 IMPLANT
SET ATX-X65L (MISCELLANEOUS) IMPLANT
SHEATH AVANTI 5FR X 11CM (SHEATH) IMPLANT
STATION PROTECTION PRESSURIZED (MISCELLANEOUS) IMPLANT
WIRE GUIDERIGHT .035X150 (WIRE) IMPLANT

## 2024-04-22 NOTE — Interval H&P Note (Signed)
 History and Physical Interval Note:  04/22/2024 1:25 PM  Cassandra Benson  has presented today for surgery, with the diagnosis of L Cath   Chest pain.  The various methods of treatment have been discussed with the patient and family. After consideration of risks, benefits and other options for treatment, the patient has consented to  Procedure(s): LEFT HEART CATH AND CORONARY ANGIOGRAPHY (Left) as a surgical intervention.  The patient's history has been reviewed, patient examined, no change in status, stable for surgery.  I have reviewed the patient's chart and labs.  Questions were answered to the patient's satisfaction.     Torrie Lafavor

## 2024-04-23 ENCOUNTER — Telehealth: Payer: Self-pay

## 2024-04-23 ENCOUNTER — Encounter: Payer: Self-pay | Admitting: Cardiovascular Disease

## 2024-04-23 NOTE — Telephone Encounter (Addendum)
 Spoke with patient. LHC yesterday -    Ost RCA lesion is 20% stenosed.   The left ventricular systolic function is normal.   LV end diastolic pressure is moderately elevated.   The left ventricular ejection fraction is 55-65% by visual estimate.   1.  Mild nonobstructive coronary artery disease.  The ostial RCA stenosis appears to be less than seen last year.  Suspect there was catheter induced spasm. 2.  Normal LV systolic function.  Moderately elevated left ventricular end-diastolic pressure at 24 mmHg. 3.  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   Recommendations: Continue medical therapy. Recommend resuming small dose furosemide .  _____________________________________________________________ Sugars have been elevated with recent prednisone  - yesterday she checked her sugar but doesn't remember what it was. She states she has not checked sugar readings today.

## 2024-04-23 NOTE — Telephone Encounter (Signed)
 Noted

## 2024-04-23 NOTE — Telephone Encounter (Unsigned)
 Copied from CRM [id number]. Topic: General - Other >> Apr 18, 2024  8:36 AM [name] GRADE wrote: Reason for CRM: Patient is returning a call she received from Dr.Gutierrez, I tried to advise of the message that was left this morning but patient wants to speak with Dr.Gutierrez only. Called CAL and was informed to send a CRM. >> Apr 22, 2024  5:03 PM [place] J wrote: Patient missed phone call again from the clinic. He wanted to let Dr. Rilla know that her procedure went well today and she apologized for missing his phone calls.

## 2024-04-23 NOTE — Telephone Encounter (Signed)
 Copied from CRM [id number]. Topic: General - Other >> Apr 23, 2024 11:55 AM Revonda D wrote: Reason for CRM: Patient is returning a call she received from Dr.Gutierrez, I tried to advise of the message that was left this morning but patient wants to speak with Dr.Gutierrez only. Called CAL and was informed to send a CRM.   UPDATE: Pt called back to speak with Dr.Gutierrez regarding the missed call she had. I informed the pt that Dr.Gutierrez left message regarding why he called and that I could relay the message. Pt stated that she only trust Dr.Gutierrez and would like for him to give her a callback as to why he called. Pt also wanted to inform him that she was back home. I informed her that he has been made aware that she's home and that the procedure went well. Pt would like a callback from Dr.Gutierrez once he is available.

## 2024-04-30 ENCOUNTER — Encounter: Payer: Self-pay | Admitting: Family Medicine

## 2024-04-30 ENCOUNTER — Ambulatory Visit (INDEPENDENT_AMBULATORY_CARE_PROVIDER_SITE_OTHER): Admitting: Family Medicine

## 2024-04-30 VITALS — BP 126/68 | HR 86 | Temp 97.8°F | Ht 63.5 in | Wt 141.2 lb

## 2024-04-30 DIAGNOSIS — E13319 Other specified diabetes mellitus with unspecified diabetic retinopathy without macular edema: Secondary | ICD-10-CM

## 2024-04-30 DIAGNOSIS — R079 Chest pain, unspecified: Secondary | ICD-10-CM

## 2024-04-30 DIAGNOSIS — M7918 Myalgia, other site: Secondary | ICD-10-CM | POA: Diagnosis not present

## 2024-04-30 DIAGNOSIS — I5032 Chronic diastolic (congestive) heart failure: Secondary | ICD-10-CM

## 2024-04-30 DIAGNOSIS — G8929 Other chronic pain: Secondary | ICD-10-CM | POA: Diagnosis not present

## 2024-04-30 NOTE — Progress Notes (Unsigned)
 Ph: [phone] Fax: [phone]   Patient ID: Cassandra Benson, female    DOB: 12-24-1940, 83 y.o.   MRN: [id number]  This visit was conducted in person.  BP 126/68   Pulse 86   Temp 97.8 F (36.6 C) (Oral)   Ht 5' 3.5 (1.613 m)   Wt 141 lb 4 oz (64.1 kg)   SpO2 95%   BMI 24.63 kg/m    CC: DM f/u visit  Subjective:   HPI: Cassandra Benson is a 83 y.o. female presenting on 04/30/2024 for Medical Management of Chronic Issues (Here for 2 wk DM f/u.)   See prior note for details.  Current diabetic regimen: Tresiba  60u nightly at 10pm (pens) Lispro (Humalog ) 7u before meals (pens)  Did not bring sugar log today - but states sugars are running much better controlled since stopping torsemide  - mostly <200s.  Cbg this morning 127.  She continues tresiba  60u nightly - she notes she forgot Tresiba  dose last night. She's also taking Lispro (humalog ) 12-13 units TID with meals.  Lab Results  Component Value Date   HGBA1C 11.3 (A) 03/18/2024    She states she's currently not taking furosemide . She thinks she's taking all other medications.   Recent LHC 04/22/2024:   Ost RCA lesion is 20% stenosed.   The left ventricular systolic function is normal.   LV end diastolic pressure is moderately elevated.   The left ventricular ejection fraction is 55-65% by visual estimate.  1.  Mild nonobstructive coronary artery disease.  The ostial RCA stenosis appears to be less than seen last year.  Suspect there was catheter induced spasm. 2.  Normal LV systolic function.  Moderately elevated left ventricular end-diastolic pressure at 24 mmHg. 3.  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   Recommendations: Continue medical therapy. Recommend resuming small dose furosemide .     Relevant past medical, surgical, family and social history reviewed and updated as indicated. Interim  medical history since our last visit reviewed. Allergies and medications reviewed and updated. Outpatient Medications Prior to Visit  Medication Sig Dispense Refill   acetaminophen  (TYLENOL ) 500 MG tablet Take 500 mg by mouth every 6 (six) hours as needed for mild pain (pain score 1-3) or headache.     albuterol  (VENTOLIN  HFA) 108 (90 Base) MCG/ACT inhaler Inhale 2 puffs into the lungs every 6 (six) hours as needed. 8 g 2   Ascorbic Acid  (VITAMIN C ) 100 MG tablet Take 100 mg by mouth daily.     aspirin  EC 81 MG tablet Take 1 tablet (81 mg total) by mouth daily. Swallow whole. 30 tablet 12   Blood Glucose Monitoring Suppl DEVI 1 each by Does not apply route in the morning, at noon, and at bedtime. E13.319. May substitute to any manufacturer covered by patient's insurance. 1 each 0   Cholecalciferol  (VITAMIN D3) 25 MCG (1000 UT) CAPS Take 2 capsules (2,000 Units total) by mouth daily.     clopidogrel  (PLAVIX ) 75 MG tablet TAKE 1 TABLET(75 MG) BY MOUTH EVERY EVENING 30 tablet 11   diphenhydrAMINE  (BENADRYL ) 50 MG tablet Take 1 tablet (50 mg total) by mouth once for 1 dose. 1 hour prior to test 1 tablet 0   donepezil  (ARICEPT ) 5 MG tablet Take 5 mg by mouth at bedtime.     Fluticasone-Umeclidin-Vilant (TRELEGY ELLIPTA ) 100-62.5-25 MCG/ACT AEPB Inhale 1 puff into the lungs daily. 28 each 11  furosemide  (LASIX ) 20 MG tablet Take 1 tablet (20 mg total) by mouth daily. 30 tablet 3   Glucagon , rDNA, (GLUCAGON  EMERGENCY) 1 MG KIT INJECT into THE muscle ONCE AS NEEDED FOR emergency 1 kit 12   glucose blood (ONETOUCH ULTRA) test strip USE THREE TIMES DAILY 100 strip 3   insulin  degludec (TRESIBA  FLEXTOUCH) 100 UNIT/ML FlexTouch Pen Inject 60 Units into the skin at bedtime.     insulin  lispro (HUMALOG ) 100 UNIT/ML injection Inject 0.07 mLs (7 Units total) into the skin 3 (three) times daily before meals. Dose per sliding scale     isosorbide  mononitrate (IMDUR ) 30 MG 24 hr tablet Take 1 tablet (30 mg total)  by mouth daily. 90 tablet 3   losartan  (COZAAR ) 50 MG tablet Take 50 mg by mouth daily.     metoprolol  succinate (TOPROL -XL) 25 MG 24 hr tablet Take 25 mg by mouth daily.     Multiple Vitamin (MULTIVITAMIN ADULT) TABS Take 1 tablet by mouth in the morning and at bedtime.     Multiple Vitamins-Minerals (PRESERVISION AREDS PO) Take 1 tablet by mouth in the morning and at bedtime.     nitroGLYCERIN  (NITROSTAT ) 0.4 MG SL tablet Place 1 tablet (0.4 mg total) under the tongue every 5 (five) minutes as needed for chest pain. 25 tablet PRN   rosuvastatin  (CRESTOR ) 20 MG tablet Take 1 tablet (20 mg total) by mouth every evening. 90 tablet 4   SYNTHROID  75 MCG tablet TAKE ONE TABLET BY MOUTH Monday-Saturday BEFORE breakfast 80 tablet 4   topiramate  (TOPAMAX ) 25 MG tablet Take 1 tablet (25 mg total) by mouth at bedtime. For headache prevention 30 tablet 6   No facility-administered medications prior to visit.     Per HPI unless specifically indicated in ROS section below Review of Systems  Objective:  BP 126/68   Pulse 86   Temp 97.8 F (36.6 C) (Oral)   Ht 5' 3.5 (1.613 m)   Wt 141 lb 4 oz (64.1 kg)   SpO2 95%   BMI 24.63 kg/m   Wt Readings from Last 3 Encounters:  04/30/24 141 lb 4 oz (64.1 kg)  04/22/24 144 lb (65.3 kg)  04/16/24 144 lb 2 oz (65.4 kg)      Physical Exam Vitals and nursing note reviewed.  Constitutional:      Appearance: Normal appearance. She is not ill-appearing.  HENT:     Head: Normocephalic and atraumatic.  Cardiovascular:     Rate and Rhythm: Normal rate and regular rhythm.     Pulses: Normal pulses.     Heart sounds: Normal heart sounds. No murmur heard. Pulmonary:     Effort: Pulmonary effort is normal. No respiratory distress.     Breath sounds: Normal breath sounds. No wheezing, rhonchi or rales.  Musculoskeletal:        General: Tenderness present.     Right lower leg: No edema.     Left lower leg: No edema.     Comments: Reproducible tenderness to  palpation at R rhomboid mm  Skin:    General: Skin is warm and dry.     Findings: No rash.  Neurological:     Mental Status: She is alert.  Psychiatric:        Mood and Affect: Mood normal.        Behavior: Behavior normal.       Results for orders placed or performed during the hospital encounter of 04/22/24  Glucose, capillary   Collection  Time: 04/22/24 10:34 AM  Result Value Ref Range   Glucose-Capillary 326 (H) 70 - 99 mg/dL  Glucose, capillary   Collection Time: 04/22/24  2:23 PM  Result Value Ref Range   Glucose-Capillary 290 (H) 70 - 99 mg/dL   *Note: Due to a large number of results and/or encounters for the requested time period, some results have not been displayed. A complete set of results can be found in Results Review.   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  Last visit we increased tresiba  10% from 54u to 60u daily. She also self-increased humalog  from 7 to 12u with meals.  She also stopped torsemide . With above changes she notes significant improvement in high readings without worsening hypoglycemia.  Will continue current regimen, did recommend taking humalog  8-10u with meals.  RTC 2-3 wks for fructosamine. She does not currently have endocrinologist.       Chronic chest pain   Reviewed recent LHC - 20% ost RCA stenosis, otherwise normal.  Continue medical management including isosorbide .  She thought she had a stent placed - discussed I don't see this occurred.       Chronic diastolic heart failure  (HCC)   She stopped torsemide  (and we increased insulin  dosage) and she has noted marked improvement in glycemic control.  Encouraged she start furosemide  20mg  daily. Kidney function is stable.       Rhomboid pain   ?rhomboid strain - suggested heating pad, provided with exercises for rhomboid strain to try.         No orders of the defined types were placed in this encounter.   No orders of the defined types were placed in this encounter.   Patient Instructions  New diabetic regimen: Tresiba  60u nightly at 10pm (pens) Lispro (Humalog ) 8-10 units before meals (pens)  Fill furosemide  (lasix ) 20mg  daily - ready at [facility] pharmacy  Good to see you today  Return to see me in 2-3 weeks, bring sugar log to that appointment.   Gentle rhomboid stretches provided today  Use heating pad to the back.   Follow up plan: Return in about 3 weeks (around 05/21/2024), or if symptoms worsen or fail to improve.  Anton Blas, MD

## 2024-04-30 NOTE — Patient Instructions (Addendum)
 New diabetic regimen: Tresiba  60u nightly at 10pm (pens) Lispro (Humalog ) 8-10 units before meals (pens)  Fill furosemide  (lasix ) 20mg  daily - ready at [facility] pharmacy  Good to see you today  Return to see me in 2-3 weeks, bring sugar log to that appointment.   Gentle rhomboid stretches provided today  Use heating pad to the back.

## 2024-05-02 ENCOUNTER — Encounter: Payer: Self-pay | Admitting: Family Medicine

## 2024-05-02 ENCOUNTER — Ambulatory Visit: Payer: Self-pay

## 2024-05-02 ENCOUNTER — Other Ambulatory Visit: Payer: Self-pay | Admitting: Vascular Surgery

## 2024-05-02 DIAGNOSIS — M7918 Myalgia, other site: Secondary | ICD-10-CM | POA: Insufficient documentation

## 2024-05-02 DIAGNOSIS — I6522 Occlusion and stenosis of left carotid artery: Secondary | ICD-10-CM

## 2024-05-02 NOTE — Telephone Encounter (Signed)
See note from PCP.  Thanks.

## 2024-05-02 NOTE — Assessment & Plan Note (Signed)
?  rhomboid strain - suggested heating pad, provided with exercises for rhomboid strain to try.

## 2024-05-02 NOTE — Assessment & Plan Note (Signed)
 Difficult to treat without home sugar data.  Last visit we increased tresiba  10% from 54u to 60u daily. She also self-increased humalog  from 7 to 12u with meals.  She also stopped torsemide . With above changes she notes significant improvement in high readings without worsening hypoglycemia.  Will continue current regimen, did recommend taking humalog  8-10u with meals.  RTC 2-3 wks for fructosamine. She does not currently have endocrinologist.

## 2024-05-02 NOTE — Telephone Encounter (Signed)
 Fwd to Dr Cleatus in Dr Talmadge absence.   Also, fwd to Dr KANDICE as rick.

## 2024-05-02 NOTE — Telephone Encounter (Signed)
 I saw her Tuesday. She had missed her Monday evening Tresiba  60u. Did she take Tuesday and Wednesday evening Tresiba  60?  At what time and how much was last dose of Humalog  (should be 10u TID with meals).  Would recommend increased water intake today and take extra 5 units of humalog  with her next humalog  insulin  dose, continue monitoring sugar control closely over next 24 hours, call us  with update/more readings tomorrow morning.

## 2024-05-02 NOTE — Telephone Encounter (Signed)
 Spoke with pt asking about insulin  and meals. Pt states confirms she took Tresiba  60 U both Tues and Weds evenings. Says she last ate at about 8:30 PM yesterday and took Humalog  10 U after meal. Pt states she has not eaten yet today, so has not taken Humalog  yet. Will eat soon.  Pt mentioned she took Tresiba  60 U at 10:30 this AM to bring BS down-  489 to 404. I relayed Dr Talmadge message/instructions. Pt verbalizes understanding and will call tomorrow with updated BS readings.

## 2024-05-02 NOTE — Telephone Encounter (Signed)
 FYI Only or Action Required?: Action required by provider: update on patient condition.  Patient was last seen in primary care on 04/30/2024 by Rilla Baller, MD.  Called Nurse Triage reporting Hyperglycemia.  Symptoms began today.  Interventions attempted: Prescription medications: Took Tresiba  60 units.  Symptoms are: stable. Pt. States her reading this was 489. Frustrated because all I've had is vegetables.  Triage Disposition: Call PCP Now Please advise pt.  Patient/caregiver understands and will follow disposition?: Yes    Copied from CRM [id number]. Topic: Clinical - Red Word Triage >> May 02, 2024 11:15 AM Sasha H wrote: [facility] that prompted transfer to Nurse Triage:  Pt called in wanting to let provider know that her glucose was 489 at 9am and she has taken 60 units of insulin     ----------------------------------------------------------------------- From previous Reason for Contact - Medical Advice: Reason for CRM: Pt called in wanting to let provider know that her glucose was 489 at 9am and she has taken 60 units of insulin  Reason for Disposition  Blood glucose > 400 mg/dL (77.7 mmol/L)  Answer Assessment - Initial Assessment Questions 1. BLOOD GLUCOSE: What is your blood glucose level?      489 2. ONSET: When did you check the blood glucose?     0900 3. USUAL RANGE: What is your glucose level usually? (e.g., usual fasting morning value, usual evening value)     200 4. KETONES: Do you check for ketones (urine or blood test strips)? If Yes, ask: What does the test show now?      no 5. TYPE 1 or 2:  Do you know what type of diabetes you have?  (e.g., Type 1, Type 2, Gestational; doesn't know)      Type 1 6. INSULIN : Do you take insulin ? What type of insulin (s) do you use? What is the mode of delivery? (syringe, pen; injection or pump)?      yes 7. DIABETES PILLS: Do you take any pills for your diabetes? If Yes, ask: Have you missed taking any  pills recently?      8. OTHER SYMPTOMS: Do you have any symptoms? (e.g., fever, frequent urination, difficulty breathing, dizziness, weakness, vomiting)     headache 9. PREGNANCY: Is there any chance you are pregnant? When was your last menstrual period?     no  Protocols used: Diabetes - High Blood Sugar-A-AH

## 2024-05-02 NOTE — Assessment & Plan Note (Signed)
 Reviewed recent LHC - 20% ost RCA stenosis, otherwise normal.  Continue medical management including isosorbide .  She thought she had a stent placed - discussed I don't see this occurred.

## 2024-05-02 NOTE — Assessment & Plan Note (Signed)
 She stopped torsemide  (and we increased insulin  dosage) and she has noted marked improvement in glycemic control.  Encouraged she start furosemide  20mg  daily. Kidney function is stable.

## 2024-05-07 ENCOUNTER — Encounter: Payer: Self-pay | Admitting: Medical

## 2024-05-07 ENCOUNTER — Ambulatory Visit: Attending: Medical | Admitting: Medical

## 2024-05-07 VITALS — BP 128/60 | HR 68 | Ht 63.0 in | Wt 140.6 lb

## 2024-05-07 DIAGNOSIS — I779 Disorder of arteries and arterioles, unspecified: Secondary | ICD-10-CM | POA: Diagnosis not present

## 2024-05-07 DIAGNOSIS — I25111 Atherosclerotic heart disease of native coronary artery with angina pectoris with documented spasm: Secondary | ICD-10-CM

## 2024-05-07 DIAGNOSIS — I5032 Chronic diastolic (congestive) heart failure: Secondary | ICD-10-CM | POA: Diagnosis not present

## 2024-05-07 DIAGNOSIS — R079 Chest pain, unspecified: Secondary | ICD-10-CM | POA: Diagnosis not present

## 2024-05-07 DIAGNOSIS — E782 Mixed hyperlipidemia: Secondary | ICD-10-CM | POA: Diagnosis not present

## 2024-05-07 DIAGNOSIS — I10 Essential (primary) hypertension: Secondary | ICD-10-CM

## 2024-05-07 NOTE — Patient Instructions (Signed)
 Medication Instructions:  Your physician recommends that you continue on your current medications as directed. Please refer to the Current Medication list given to you today.    *If you need a refill on your cardiac medications before your next appointment, please call your pharmacy*  Lab Work: No labs ordered today    Testing/Procedures: No test ordered today   Follow-Up: At [facility], you and your health needs are our priority.  As part of our continuing mission to provide you with exceptional heart care, our providers are all part of one team.  This team includes your primary Cardiologist (physician) and Advanced Practice Providers or APPs (Physician Assistants and Nurse Practitioners) who all work together to provide you with the care you need, when you need it.  Your next appointment:   6 month(s)  Provider:   You may see Timothy Gollan, MD or one of the following Advanced Practice Providers on your designated Care Team:   Cadence [place], PA-C

## 2024-05-07 NOTE — Progress Notes (Signed)
 Cardiology Office Note   Date:  05/07/2024  ID:  Madell, Heino [birth year], MRN [id number] PCP: Rilla Baller, MD  [facility] Providers Cardiologist:  Evalene Lunger, MD   History of Present Illness Cassandra Benson is a 83 y.o. female with a h/o HTN, HLD, hypothyroidism, nonobstructive CAD, fibromyalgia, PAD, Sjrogren's syndrome and heart failure with preserved EF, adjustment disorder, bilateral carotid disease who presents for follow-up of LHC.   2000 LHC without significant disease. 03/2019 MPI without ischemia and ruled a low risk scan. 07/2020 echo with EF 55 to 60%, G2 DD. 10/2019 Cardiac CTA with no CAD. 11/2022 R/L heart cath with no critical artery disease, ~50% stenosis af the ostium of the RCA, no significant CAD in the Left coronary artery, normal L&R filling pressures, normal CO/CI, possible component of vasospasm. Echo 3/204 LVEF 60-65%  The patient was admitted 11/2023 for near syncope/syncope due to orthostatic hypotension. Echo during admission 3/025 showed LVEF 60-65%, no WMA, G1DD.   The patient was admitted in June 2025 for chest pain felt to be from costochondritis. Hs troponin negative.    She went to the ER 04/05/24 for chest pain (same as prior admission). HS trop was negative. She was discharged home and told to follow-up with cardiology as outpatient.   Patient was last seen 04/10/2024 reporting chest pain.  She felt as if her chest pain complaints were being ignored.  She was ultimately set up for a left heart cath.  Patient presented for elective heart cath 04/22/2024.  This showed mild nonobstructive CAD, ostial RCA stenosis was less than the year before, suspect catheter induced spasm, normal LV systolic function, moderately elevated LVEDP.  Medical therapy was recommended.  Today, the patient reports last week she had occasional low blood sugars. She reports persistent chest pain and shortness of breath that is overall atypical. She  denies lower leg edema. She is taking lasix  20mg  daily. Cath site, right groin, has healed well.   Studies Reviewed EKG Interpretation Date/Time:  Tuesday May 07 2024 09:27:26 EDT Ventricular Rate:  68 PR Interval:  176 QRS Duration:  70 QT Interval:  418 QTC Calculation: 444 R Axis:   6  Text Interpretation: Normal sinus rhythm Low voltage QRS When compared with ECG of 10-Apr-2024 14:42, No significant change was found Confirmed by Franchester, Terricka Onofrio ([id number]) on 05/07/2024 9:31:56 AM    LHC 03/2024    Ost RCA lesion is 20% stenosed.   The left ventricular systolic function is normal.   LV end diastolic pressure is moderately elevated.   The left ventricular ejection fraction is 55-65% by visual estimate.   1.  Mild nonobstructive coronary artery disease.  The ostial RCA stenosis appears to be less than seen last year.  Suspect there was catheter induced spasm. 2.  Normal LV systolic function.  Moderately elevated left ventricular end-diastolic pressure at 24 mmHg. 3.  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   Recommendations: Continue medical therapy. Recommend resuming small dose furosemide .    Echo 11/2023 1. Left ventricular ejection fraction, by estimation, is 60 to 65%. Left  ventricular ejection fraction by 2D MOD biplane is 61.9 %. The left  ventricle has normal function. The left ventricle has no regional wall  motion abnormalities. Left ventricular  diastolic parameters are consistent with Grade I diastolic dysfunction  (impaired relaxation).   2. Right ventricular systolic function is normal. The right ventricular  size is normal. There is normal pulmonary artery systolic pressure.   3. The mitral valve is normal in structure. No evidence of mitral valve  regurgitation.   4. The aortic valve is tricuspid. Aortic valve regurgitation is not  visualized. Aortic valve sclerosis is  present, with no evidence of aortic  valve stenosis. Aortic valve mean gradient measures 5.0 mmHg.    US  carotid B/L 11/2023 IMPRESSION: 1. Left carotid artery stent is patent without significant stenosis. 2. Minimal plaque at the right carotid bulb. No significant stenosis in the right internal carotid artery. 3. Vertebral arteries are patent with antegrade flow.     Echo 11/2022 1. Left ventricular ejection fraction, by estimation, is 60 to 65%. The  left ventricle has normal function. The left ventricle has no regional  wall motion abnormalities. Left ventricular diastolic parameters are  consistent with Grade I diastolic  dysfunction (impaired relaxation).   2. Right ventricular systolic function is normal. The right ventricular  size is normal. Tricuspid regurgitation signal is inadequate for assessing  PA pressure.   3. The mitral valve is normal in structure. No evidence of mitral valve  regurgitation. No evidence of mitral stenosis.   4. The aortic valve is normal in structure. Aortic valve regurgitation is  not visualized. No aortic stenosis is present.   5. The inferior vena cava is normal in size with greater than 50%  respiratory variability, suggesting right atrial pressure of 3 mmHg.      R/L heart cath 11/2022 Conclusions: No critical coronary artery disease.  The is up to ~50% stenosis at the ostium of the RCA with pressure dampening noted using 76F diagnostic catheter.  Slight improvement noted with intracoronary nitroglycerin  suggesting at least some component of vasospasm.  No angiographically significant coronary artery disease noted in the left coronary artery. Normal left and right heart filling pressures. Normal Fick cardiac output/index. Small right radial artery with significant vasospasm throughout the procedure.  Recommend alternate access for future catheterizations.   Recommendations: Add isosorbide  mononitrate 15 mg daily and as needed sublingual  nitroglycerin  for antianginal therapy, including possible coronary vasospasm. Secondary prevention of ASCVD. Maintain net even fluid balance.   Cassandra Hanson, MD [facility]      Physical Exam VS:  BP 128/60 (BP Location: Left Arm, Patient Position: Sitting, Cuff Size: Normal)   Pulse 68   Ht 5' 3 (1.6 m)   Wt 140 lb 9.6 oz (63.8 kg)   SpO2 99%   BMI 24.91 kg/m        Wt Readings from Last 3 Encounters:  05/07/24 140 lb 9.6 oz (63.8 kg)  04/30/24 141 lb 4 oz (64.1 kg)  04/22/24 144 lb (65.3 kg)    GEN: Well nourished, well developed in no acute distress NECK: No JVD; No carotid bruits CARDIAC: RRR, no murmurs, rubs, gallops RESPIRATORY:  Clear to auscultation without rales, wheezing or rhonchi  ABDOMEN: Soft, non-tender, non-distended EXTREMITIES:  No edema; No deformity   ASSESSMENT AND PLAN  Chest pain Mild nonobstructive CAD Recent heart cath showed mild nonobstructive CAD. She reports persistent chest pain that is atypical.  No further workup at this time.  Continue ASA 81mg daily, Plavix  75mg  daily, Toprol  25mg  daily, Imdur  30mg  daily, Crestor  20mg  daily.   HTN BP today is good. Continue Losartan  50mg  daily, Imdur  30mg  daily and Toprol  25mg  daily.   HFpEF Heart cath showed moderately elevated LVEDP. She takes lasix  20mg  daily. She appears euvolemic.   HLD LDL 126. Continue Crestor   20mg  daily.   Bilateral carotid disease Carotid US  showed patent stent on the left side and minimal plaque on the right side. This is followed by [facility].     Cardiac Rehabilitation Eligibility Assessment         Dispo: Follow-up in 6 months  Signed, Gyan Cambre VEAR Fishman, PA-C

## 2024-05-08 NOTE — Telephone Encounter (Addendum)
 She never called us  back with sugar readings Plz call to get some sugar readings over the past week, and ensure she's been taking Tresiba  60u once daily in evenings as well as her lispro mealtime correction insulin  (~10u each time).

## 2024-05-09 ENCOUNTER — Ambulatory Visit: Payer: Self-pay

## 2024-05-09 NOTE — Telephone Encounter (Signed)
 Reached out to Cassandra Benson about BS. Cassandra Benson was have trouble with her monitor, will call with readings. Please ask Cassandra Benson questions below when she calls per provider.

## 2024-05-09 NOTE — Telephone Encounter (Signed)
 Pt that she is a little confused and worn out, states that she has not changed anything in her diet. States that she believes this is from inflammation. States that her BG are ranging from 200-300. States that she is taking her medication.   Pt states that last week that she ended up having the same thing happened as when she had the white matter disease. States that she doesn't want to go to the hospital. Offered appt this morning at 11:20 pt declines and states she is not ready to travel the hwy. Pt would not give numbers for each day after being asked multiple times she just gave a range, and states that she has not missed any of her insulin .     Please advise

## 2024-05-09 NOTE — Telephone Encounter (Signed)
 FYI Only or Action Required?: Action required by provider: update on patient condition.  Patient was last seen in primary care on 04/30/2024 by Rilla Baller, MD.  Called Nurse Triage reporting Blood Sugar Problem.  Symptoms began a week ago.  Interventions attempted: Nothing.  Symptoms are: unchanged.  Triage Disposition: Home Care  Patient/caregiver understands and will follow disposition?: No, wishes to speak with PCP        Copied from CRM [id number]. Topic: Clinical - Red Word Triage >> May 09, 2024 10:21 AM Thersia BROCKS wrote: Red Word that prompted transfer to Nurse Triage: Patient called in regarding her blood sugars and stated she has been trying everything. Patient stated she tested her blood sugar this morning at it was in the 400s . Stated a couple of days ago everything went black Reason for Disposition  [1] Blood glucose 240 - 300 mg/dL (86.6 - 83.2 mmol/L) AND [2] uses insulin  (e.g., insulin -dependent, all people with type 1 diabetes)  Answer Assessment - Initial Assessment Questions 1. BLOOD GLUCOSE: What is your blood glucose level?     394 2. ONSET: When did you check the blood glucose?     This morning  3. USUAL RANGE: What is your glucose level usually? (e.g., usual fasting morning value, usual evening value)     Has been ranging from 200-300's 4. KETONES: Do you check for ketones (urine or blood test strips)? If Yes, ask: What does the test show now?      no 5. TYPE 1 or 2:  Do you know what type of diabetes you have?  (e.g., Type 1, Type 2, Gestational; doesn't know)      Type 2 6. INSULIN : Do you take insulin ? What type of insulin (s) do you use? What is the mode of delivery? (syringe, pen; injection or pump)?      Yes has not missed any 7. DIABETES PILLS: Do you take any pills for your diabetes? If Yes, ask: Have you missed taking any pills recently?     no 8. OTHER SYMPTOMS: Do you have any symptoms? (e.g., fever, frequent  urination, difficulty breathing, dizziness, weakness, vomiting)     Headaches,  Protocols used: Diabetes - High Blood Sugar-A-AH

## 2024-05-10 ENCOUNTER — Telehealth: Payer: Self-pay | Admitting: Family

## 2024-05-10 ENCOUNTER — Other Ambulatory Visit: Payer: Self-pay | Admitting: Family Medicine

## 2024-05-10 MED ORDER — BLOOD GLUCOSE MONITORING SUPPL DEVI: 1.0000 | Freq: Three times a day (TID) | 0 refills | Status: DC

## 2024-05-10 MED ORDER — TRESIBA FLEXTOUCH 100 UNIT/ML ~~LOC~~ SOPN: 60.0000 [IU] | PEN_INJECTOR | Freq: Every day | SUBCUTANEOUS | 0 refills | Status: AC

## 2024-05-10 MED ORDER — INSULIN LISPRO 100 UNIT/ML IJ SOLN: 7.0000 [IU] | Freq: Three times a day (TID) | INTRAMUSCULAR | 0 refills | Status: AC

## 2024-05-10 NOTE — Telephone Encounter (Signed)
 Called to confirm/remind patient of [facility] on [facility].   Appointment:   [x] Confirmed  [] Left mess   [] No answer/No voice mail  [] VM Full/unable to leave message  [] Phone not in service  Patient reminded to bring all medications and/or complete list.  Confirmed patient has transportation. Gave directions, instructed to utilize valet parking.

## 2024-05-10 NOTE — Telephone Encounter (Signed)
 Copied from CRM #[id number]. Topic: Clinical - Medication Refill >> May 10, 2024 10:48 AM Tanazia G wrote: Medication: insulin  degludec (TRESIBA  FLEXTOUCH) 100 UNIT/ML FlexTouch Pen insulin  lispro (HUMALOG ) 100 UNIT/ML injection New Glucometer (Patients old one is not reading)   Has the patient contacted their pharmacy? Yes (Agent: If no, request that the patient contact the pharmacy for the refill. If patient does not wish to contact the pharmacy document the reason why and proceed with request.) (Agent: If yes, when and what did the pharmacy advise?)  This is the patient's preferred pharmacy:  [facility] - [place], [place] - [address] [place] [address] Phone: [phone] Fax: [phone]  Is this the correct pharmacy for this prescription? Yes If no, delete pharmacy and type the correct one.   Has the prescription been filled recently? Yes  Is the patient out of the medication? Yes  Has the patient been seen for an appointment in the last year OR does the patient have an upcoming appointment? Yes  Can we respond through MyChart? Yes  Agent: Please be advised that Rx refills may take up to 3 business days. We ask that you follow-up with your pharmacy.

## 2024-05-10 NOTE — Telephone Encounter (Signed)
Rxs sent electronically.

## 2024-05-10 NOTE — Telephone Encounter (Signed)
 Pt also requesting new glucometer

## 2024-05-12 NOTE — Progress Notes (Deleted)
 Advanced Heart Failure Clinic Note    PCP: Rilla Baller, MD  Primary Cardiologist: Perla Lye, MD  HF provider: Gardenia Led, MD   Chief Complaint:    HPI:  Cassandra Benson is a 83 yo female with a PMHx significant for HTN, HLD, hypothyroidism, asthma, CAD, LADA, HA's, fibromyalgia, PAD, vasospasm, Sjogren's syndrome, and CHF.   Echo 07/30/20: EF of 55-60% with grade II diastolic dysfunction. Echo 12/24/21: EF of 60-65% along with mild MR.  Was in the ED 11/16/22 due to SOB due to HF exacerbation. Was in the ED 01/13/23 due to SOB/ pedal edema. Elevated D-dimer. VQ scan negative for PE.   RHC 12/02/22: No critical coronary artery disease. The is up to ~50% stenosis at the ostium of the RCA with pressure dampening noted using 79F diagnostic catheter.  Slight improvement noted with intracoronary nitroglycerin  suggesting at least some component of vasospasm.  No angiographically significant coronary artery disease noted in the left coronary artery. Normal left and right heart filling pressures. Normal Fick cardiac output/index. Small right radial artery with significant vasospasm throughout the procedure.  Recommend alternate access for future catheterizations. Recommendations: Add isosorbide  mononitrate 15 mg daily and as needed sublingual nitroglycerin  for antianginal therapy, including possible coronary vasospasm.  Echo 12/23/22: EF 60-65% with Grade I DD.   Was in the ED 03/14/23 due to chest pain that woke her from sleep associated with chest pressure and heaviness in both sides of her chest. EKG, CXR and labs were unremarkable and she was released with improvement of pain.   Was in the ED 07/20/23 due to shortness of breath & chest pain. EKG normal. CXR nl. D dimer elevated but VQ scan negative for PE.   Admitted 11/30/23 with near syncope while walking. Received IV fluid bolus, held antihypertensives and insulin  with improvement in blood pressure. Nothing observed on  telemetry. MRI showing no acute findings. Chest x-ray noted no consolidations. Upon discharge decrease dose of losartan  and stop beta-blocker. Will also decrease nightly insulin  from 60 units down to 30 units. Discharged the next day.   Echo 12/01/23: EF 60-65%, G1DD, normal RV, normal PA pressure  Admitted 03/08/24 with chest pain. On the night before admission patient developed nausea, vomiting and diarrhea lasted the whole night, resolved next morning. Later in the morning she developed chest pain. No associated symptoms. On presentation vital stable, labs with elevated D-dimer at 0.83. Mild leukocytosis at 11.7 and mild hypokalemia. Chest x-ray negative for acute abnormalities. Troponin remained negative x 2 and EKG with no acute ST changes. Pain seems reproducible likely costochondritis. CTA 03/09/24 was negative for any PE or other acute abnormality. Potassium corrected.   Was in the ED 04/03/24 with glucose 300-400 range. IVF and SQ insulin  given with improvement of glucose. No UTI, DKA. CXR negative.   Was in the ED 04/05/24 with chest pain and shortness of breath. Continues with elevated glucose. No DKA. Cardiology consulted.   LHC 04/22/24: EF 55-65%. Mild nonobstructive coronary artery disease. The ostial RCA stenosis appears to be less than seen last year. Suspect there was catheter induced spasm. Normal LV systolic function. Moderately elevated left ventricular end-diastolic pressure at 24 mmHg.   She presents today for a HF f/u visit with a chief complaint of    ROS: All systems negative except as listed in HPI, PMH and Problem List.  SH:  Social History   Socioeconomic History   Marital status: Widowed    Spouse name: Not on file   Number of  children: 2   Years of education: Not on file   Highest education level: 12th grade  Occupational History   Occupation: retired  Tobacco Use   Smoking status: Never   Smokeless tobacco: Never  Vaping Use   Vaping status: Never Used   Substance and Sexual Activity   Alcohol use: No   Drug use: No   Sexual activity: Not on file  Other Topics Concern   Not on file  Social History Narrative   Lives in [place], moved from [place].    Widow - husband decreased 01/2016 of metastatic colon CA   No pets.   Son Cassandra Benson lives nearby. Daughter lives in [place] . Sister lives 2 blocks away.    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, [employer]   Exercise - limited   Diet - good water, fruits/vegetables daily, limited meat, protein drink every morning   Social Drivers of Corporate investment banker Strain: Medium Risk (03/26/2024)   Overall Financial Resource Strain (CARDIA)    Difficulty of Paying Living Expenses: Somewhat hard  Food Insecurity: No Food Insecurity (03/27/2024)   Hunger Vital Sign    Worried About Running Out of Food in the Last Year: Never true    Ran Out of Food in the Last Year: Never true  Transportation Needs: No Transportation Needs (03/27/2024)   PRAPARE - Administrator, Civil Service (Medical): No    Lack of Transportation (Non-Medical): No  Physical Activity: Insufficiently Active (10/31/2023)   Exercise Vital Sign    Days of Exercise per Week: 3 days    Minutes of Exercise per Session: 20 min  Stress: No Stress Concern Present (10/31/2023)   [employer] of Occupational Health - Occupational Stress Questionnaire    Feeling of Stress : Only a little  Social Connections: Moderately Integrated (03/08/2024)   Social Connection and Isolation Panel    Frequency of Communication with Friends and Family: Three times a week    Frequency of Social Gatherings with Friends and Family: Three times a week    Attends Religious Services: More than 4 times per year    Active Member of Clubs or Organizations: Yes    Attends Banker Meetings: More than 4 times per year    Marital Status: Widowed   Intimate Partner Violence: Not At Risk (03/27/2024)   Humiliation, Afraid, Rape, and Kick questionnaire    Fear of Current or Ex-Partner: No    Emotionally Abused: No    Physically Abused: No    Sexually Abused: No    FH:  Family History  Problem Relation Age of Onset   CAD Mother 66       MI, aortic valve issues   COPD Mother    Lupus Mother    Yvone' disease Mother    Rheum arthritis Mother    CAD Father 26       CABG x2, aortic valve replacement   Stroke Sister    CAD Sister    Anuerysm Sister        brain   Lupus Sister    Diabetes Sister    Diabetes Sister    Breast cancer Sister    Alcohol abuse Brother    CAD Brother 73       MI   Stroke Brother    COPD Brother        agent orange  CAD Brother 60       stent   Diabetes Brother    Stroke Maternal Grandmother    Hypertension Maternal Grandmother    Gallbladder disease Maternal Grandmother    Breast cancer Maternal Aunt    Breast cancer Maternal Aunt    Depression Grandchild    Colon cancer Neg Hx    Esophageal cancer Neg Hx    Rectal cancer Neg Hx    Stomach cancer Neg Hx     Past Medical History:  Diagnosis Date   Acute diverticulitis 04/2021   [facility] ER, CT confirmed   Allergy    ANA positive    positive ANA pattern 1 speckled   Arthritis    Carotid stenosis, asymptomatic 06/19/2015   1-39% RICA 40-59% LICA rpt 1 yr (05/2015)    CHF (congestive heart failure) (HCC)    Colon polyps    COVID-19 virus infection 09/14/2021   Dermatomyositis (HCC)    Diabetes mellitus without complication (HCC)    Type 1   Diverticulosis    sigmoid on CT scan 12/2019   Family history of adverse reaction to anesthesia    brothr went into cardiac arrest from anectine   Fibromyalgia    prior PCP   GERD (gastroesophageal reflux disease)    prior PCP   Glaucoma    Narrow angle   History of blood clots    DVT, in 20s, none since   History of chicken pox    History of diverticulitis    History of pericarditis  1986   with hospitalization   History of pneumonia 2014   History of shingles    History of UTI    Hyperlipidemia    Hypertension    Hypothyroidism    Mixed connective tissue disease (HCC)    Partial small bowel obstruction (HCC) 12/2019   managed conservatively   Peptic ulcer    Pneumonia    PONV (postoperative nausea and vomiting)    Raynaud's disease without gangrene    Shoulder pain left   h/o RTC tendonitis and adhesive capsulitis   Sigmoid diverticulitis 05/26/2021   Sjogren's syndrome (HCC)    Sleep apnea    prior PCP - no CPAP for about 10 yrs   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HCC)    Vitamin D  deficiency    prior PCP    Current Outpatient Medications  Medication Sig Dispense Refill   acetaminophen  (TYLENOL ) 500 MG tablet Take 500 mg by mouth every 6 (six) hours as needed for mild pain (pain score 1-3) or headache.     albuterol  (VENTOLIN  HFA) 108 (90 Base) MCG/ACT inhaler Inhale 2 puffs into the lungs every 6 (six) hours as needed. 8 g 2   Ascorbic Acid  (VITAMIN C ) 100 MG tablet Take 100 mg by mouth daily.     aspirin  EC 81 MG tablet Take 1 tablet (81 mg total) by mouth daily. Swallow whole. 30 tablet 12   Blood Glucose Monitoring Suppl DEVI 1 each by Does not apply route in the morning, at noon, and at bedtime. E13.319. May substitute to any manufacturer covered by patient's insurance. 1 each 0   Cholecalciferol  (VITAMIN D3) 25 MCG (1000 UT) CAPS Take 2 capsules (2,000 Units total) by mouth daily.     clopidogrel  (PLAVIX ) 75 MG tablet TAKE 1 TABLET(75 MG) BY MOUTH EVERY EVENING 30 tablet 11   diphenhydrAMINE  (BENADRYL ) 50 MG tablet Take 1 tablet (50 mg total) by mouth once for 1 dose. 1 hour prior to test 1  tablet 0   donepezil  (ARICEPT ) 5 MG tablet Take 5 mg by mouth at bedtime.     Fluticasone-Umeclidin-Vilant (TRELEGY ELLIPTA ) 100-62.5-25 MCG/ACT AEPB Inhale 1 puff into the lungs daily. 28 each 11   furosemide  (LASIX ) 20 MG tablet Take 1 tablet (20 mg total) by mouth daily. 30  tablet 3   Glucagon , rDNA, (GLUCAGON  EMERGENCY) 1 MG KIT INJECT into THE muscle ONCE AS NEEDED FOR emergency 1 kit 12   glucose blood (ONETOUCH ULTRA) test strip USE THREE TIMES DAILY 100 strip 3   insulin  degludec (TRESIBA  FLEXTOUCH) 100 UNIT/ML FlexTouch Pen Inject 60 Units into the skin at bedtime. 18 mL 0   insulin  lispro (HUMALOG ) 100 UNIT/ML injection Inject 0.07 mLs (7 Units total) into the skin 3 (three) times daily before meals. Dose per sliding scale 10 mL 0   isosorbide  mononitrate (IMDUR ) 30 MG 24 hr tablet Take 1 tablet (30 mg total) by mouth daily. 90 tablet 3   losartan  (COZAAR ) 50 MG tablet Take 50 mg by mouth daily.     metoprolol  succinate (TOPROL -XL) 25 MG 24 hr tablet Take 25 mg by mouth daily.     Multiple Vitamin (MULTIVITAMIN ADULT) TABS Take 1 tablet by mouth in the morning and at bedtime.     Multiple Vitamins-Minerals (PRESERVISION AREDS PO) Take 1 tablet by mouth in the morning and at bedtime.     nitroGLYCERIN  (NITROSTAT ) 0.4 MG SL tablet Place 1 tablet (0.4 mg total) under the tongue every 5 (five) minutes as needed for chest pain. 25 tablet PRN   rosuvastatin  (CRESTOR ) 20 MG tablet Take 1 tablet (20 mg total) by mouth every evening. 90 tablet 4   SYNTHROID  75 MCG tablet TAKE ONE TABLET BY MOUTH Monday-Saturday BEFORE breakfast 80 tablet 4   topiramate  (TOPAMAX ) 25 MG tablet Take 1 tablet (25 mg total) by mouth at bedtime. For headache prevention 30 tablet 6   No current facility-administered medications for this visit.     PHYSICAL EXAM:  General: Well appearing. No resp difficulty HEENT: normal Neck: supple, no JVD Cor: Regular rhythm, rate. No rubs, gallops or murmurs Lungs: clear Abdomen: soft, nontender, nondistended. Extremities: no cyanosis, clubbing, rash, trace pitting edema bilateral lower legs Neuro: alert & oriented X 3. Moves all 4 extremities w/o difficulty. Affect pleasant   ECG: not done     ASSESSMENT & PLAN:  NICM with preserved  EF- - likely d/t HTN as cath showed no critical CAD - NYHA III - euvolemic - weighing daily; reminded to call for an overnight weight gain of >2 pounds or a weekly weight gain of >5 pounds - weight 149 pounds from last visit here 6 months ago - Echo 07/30/20: EF of 55-60% with grade II diastolic dysfunction.  - Echo 6/68/76: EF of 60-65% along with mild MR.  - Echo 12/23/22: EF 60-65% with Grade I DD.  - Echo 12/01/23: EF 60-65%, G1DD, normal RV, normal PA pressure - adhering to low sodium diet and fluid restriction - continue losartan  25mg  daily - continue metoprolol  succinate 25mg  daily  - continue torsemide  40mg  daily - valsartan caused hacking cough - can not add SGLT2 due to LADA - saw HF provider Marcelina) 03/24 - encouraged to wear compression socks daily and elevate legs when sitting for long periods  - BNP 04/05/24 was 88.3  2. HTN - BP  - saw PCP Achilles) 08/25 - BMP 04/10/24 reviewed: sodium 138, potassium 3.6, creatinine 0.91 & GFR >60   3: CAD- -  RHC 12/02/22:   No critical coronary artery disease. The is up to ~50% stenosis at the ostium of the RCA with pressure dampening noted using 53F diagnostic catheter. Slight improvement noted with intracoronary nitroglycerin  suggesting at least some component of vasospasm. No angiographically significant coronary artery disease noted in the left coronary artery. Normal left and right heart filling pressures. Normal Fick cardiac output/index. Small right radial artery with significant vasospasm throughout the procedure.  - saw cardiology Dene) 08/25 - LHC 04/22/24: EF 55-65%. Mild nonobstructive coronary artery disease. The ostial RCA stenosis appears to be less than seen last year. Suspect there was catheter induced spasm. Normal LV systolic function. Moderately elevated left ventricular end-diastolic pressure at 24 mmHg.  4. LADA - saw endocrinology Johnnie) 03/25 - A1c 03/18/24 reviewed and was 11.3% - declines lifestyle center  referral as she says that she's been in the past  5: Mild obstructive sleep apnea - saw pulmonology Herlene) 04/25 - home sleep study done 09/22/22 which showed mild obstructive sleep apnea with AHI of 8.5 and oxygen saturations as low as 82% nocturnally. Patient declined CPAP/oxygen.  - PFT's 04/22/22  6: Hyperlipidemia- - continue rosuvastatin  20mg  daily - LDL 10/09/23 reviewed and was elevated at 126 & has trended up over the last 2 years    Ellouise DELENA Class, [place] 05/12/24

## 2024-05-13 ENCOUNTER — Encounter: Payer: PPO | Admitting: Family

## 2024-05-13 ENCOUNTER — Telehealth: Payer: Self-pay

## 2024-05-13 ENCOUNTER — Telehealth: Payer: Self-pay | Admitting: Family

## 2024-05-13 NOTE — Telephone Encounter (Signed)
 Patient did not show for her Heart Failure Clinic appointment on 05/13/24.

## 2024-05-13 NOTE — Telephone Encounter (Signed)
 Called to get pt back on schedule after no show

## 2024-05-15 ENCOUNTER — Ambulatory Visit: Admitting: Family Medicine

## 2024-05-15 NOTE — Telephone Encounter (Signed)
 Patient had office visit today that she no showed. Patient did not remember making appointment but did want to be seen. Scheduled for Friday in office. She will bring glucometer with her for review at that time.

## 2024-05-17 ENCOUNTER — Ambulatory Visit: Admitting: Family Medicine

## 2024-05-17 ENCOUNTER — Encounter: Payer: Self-pay | Admitting: Family Medicine

## 2024-05-17 VITALS — BP 122/78 | HR 93 | Temp 98.0°F | Ht 63.0 in | Wt 141.1 lb

## 2024-05-17 DIAGNOSIS — E1142 Type 2 diabetes mellitus with diabetic polyneuropathy: Secondary | ICD-10-CM | POA: Insufficient documentation

## 2024-05-17 DIAGNOSIS — G3184 Mild cognitive impairment, so stated: Secondary | ICD-10-CM

## 2024-05-17 DIAGNOSIS — Z794 Long term (current) use of insulin: Secondary | ICD-10-CM | POA: Diagnosis not present

## 2024-05-17 DIAGNOSIS — E13319 Other specified diabetes mellitus with unspecified diabetic retinopathy without macular edema: Secondary | ICD-10-CM

## 2024-05-17 MED ORDER — DONEPEZIL HCL 5 MG PO TABS: 5.0000 mg | ORAL_TABLET | Freq: Every day | ORAL | 1 refills | Status: DC

## 2024-05-17 MED ORDER — PRODIGY NO CODING BLOOD GLUC VI STRP: ORAL_STRIP | 12 refills | Status: AC

## 2024-05-17 MED ORDER — PRODIGY AUTOCODE BLOOD GLUCOSE DEVI: 1.0000 | Freq: Every day | Status: AC

## 2024-05-17 NOTE — Patient Instructions (Addendum)
 Current diabetes regimen: Tresiba  60u nightly at 10pm (pens)  Lispro (Humalog ) 8-10u before meals (pens)   Pick up calendar today to start jotting down sugar readings then bring to each visit, starting with next week's appointment on Tuesday. Your sister Velia is going to help you over the weekend.   I have refilled donepezil  5mg  to take nightly.  Good to see you today.  Return on Tuesday for sugar check.

## 2024-05-17 NOTE — Assessment & Plan Note (Signed)
 Difficult to titrate insulin  without home sugar readings. She struggles to record her sugars in one location and then bring in sugar meter or sugar log to office visits.  She notes ongoing high sugars. Last visit we increased tresiba  10% to 60u daily, continued humalog  mealtime insulin  8-10u TID AC.  With patient's permission I spoke to her sister Cassandra Benson who agrees to help her over the next 4 days to record sugars in 1 location to bring into next office visit. She states she will likely buy new calendar where she can record sugars every day.  She does not currently have and endocrinologist - would again try [facility] if needed (last referred 11/2023).  She has appt next Tuesday in office - advised keep that, bring in sugar log with sister's assistance over this weekend.

## 2024-05-17 NOTE — Progress Notes (Signed)
 Ph: [phone] Fax: [phone]   Patient ID: Cassandra Benson, female    DOB: 26-Aug-1941, 83 y.o.   MRN: [id number]  This visit was conducted in person.  BP 122/78   Pulse 93   Temp 98 F (36.7 C) (Oral)   Ht 5' 3 (1.6 m)   Wt 141 lb 2 oz (64 kg)   SpO2 94%   BMI 25.00 kg/m    CC: f/u visit  Subjective:   HPI: Cassandra Benson is a 83 y.o. female presenting on 05/17/2024 for Medical Management of Chronic Issues (Pt C/o Feels unbalanced. Fatigue Last couple of has gotten worse/ )   She mentions Cassandra Benson who is her sister and also diabetic - but not on insulin .   See prior notes for details.  She missed our last scheduled appt on 05/15/2024. Missed scheduled appt today - we called her to remind her so she was seen at end of the day.  She missed CHF clinic appt on 05/13/2024. Has been rescheduled for 05/22/2024.   Current diabetic regimen: Tresiba  60u nightly at 10pm (pens)  Lispro (Humalog ) 8-10u before meals (pens)   Did not bring sugar log.  She states she just bought [employer] and states this is working very well for her. Did not bring glucometer in today.   She states sugars are ranging 200-300s.  Denies low sugars <70.   Notes ongoing difficulty with low energy, extreme exertional dyspnea. Notes ongoing dizziness, unsteadiness. Notes neuropathy to all toes.   She has had reassuring pulmonary and cardiac evaluation including [facility] 04/22/2024:      Ost RCA lesion is 20% stenosed.   The left ventricular systolic function is normal.   LV end diastolic pressure is moderately elevated.   The left ventricular ejection fraction is 55-65% by visual estimate.  1.  Mild nonobstructive coronary artery disease.  The ostial RCA stenosis appears to be less than seen last year.  Suspect there was catheter induced spasm. 2.  Normal LV systolic function.  Moderately elevated left ventricular end-diastolic pressure at 24 mmHg. 3.  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  Recommendations: Continue medical therapy. Recommend resuming small dose furosemide .     Relevant past medical, surgical, family and social history reviewed and updated as indicated. Interim medical history since our last visit reviewed. Allergies and medications reviewed and updated. Outpatient Medications Prior to Visit  Medication Sig Dispense Refill   acetaminophen  (TYLENOL ) 500 MG tablet Take 500 mg by mouth every 6 (six) hours as needed for mild pain (pain score 1-3) or headache.     albuterol  (VENTOLIN  HFA) 108 (90 Base) MCG/ACT inhaler Inhale 2 puffs into the lungs every 6 (six) hours as needed. 8 g 2   Ascorbic Acid  (VITAMIN C ) 100 MG tablet Take 100 mg by mouth daily.     aspirin  EC 81 MG tablet Take 1 tablet (81 mg total) by mouth daily. Swallow whole. 30 tablet 12   Cholecalciferol  (VITAMIN D3) 25 MCG (1000 UT) CAPS Take 2 capsules (2,000 Units total) by mouth daily.     clopidogrel  (PLAVIX ) 75 MG tablet TAKE 1 TABLET(75 MG) BY MOUTH EVERY EVENING 30 tablet 11   Fluticasone-Umeclidin-Vilant (TRELEGY ELLIPTA ) 100-62.5-25 MCG/ACT AEPB Inhale 1 puff into the lungs daily. 28 each 11   furosemide  (LASIX ) 20 MG tablet Take 1 tablet (20 mg total) by mouth daily. 30 tablet 3  Glucagon , rDNA, (GLUCAGON  EMERGENCY) 1 MG KIT INJECT into THE muscle ONCE AS NEEDED FOR emergency 1 kit 12   insulin  degludec (TRESIBA  FLEXTOUCH) 100 UNIT/ML FlexTouch Pen Inject 60 Units into the skin at bedtime. 18 mL 0   insulin  lispro (HUMALOG ) 100 UNIT/ML injection Inject 0.07 mLs (7 Units total) into the skin 3 (three) times daily before meals. Dose per sliding scale 10 mL 0   isosorbide  mononitrate (IMDUR ) 30 MG 24 hr tablet Take 1 tablet (30 mg total) by mouth daily. 90 tablet 3   losartan  (COZAAR ) 50 MG tablet Take 50 mg by mouth daily.     metoprolol  succinate (TOPROL -XL) 25 MG 24 hr tablet  Take 25 mg by mouth daily.     Multiple Vitamin (MULTIVITAMIN ADULT) TABS Take 1 tablet by mouth in the morning and at bedtime.     Multiple Vitamins-Minerals (PRESERVISION AREDS PO) Take 1 tablet by mouth in the morning and at bedtime.     nitroGLYCERIN  (NITROSTAT ) 0.4 MG SL tablet Place 1 tablet (0.4 mg total) under the tongue every 5 (five) minutes as needed for chest pain. 25 tablet PRN   rosuvastatin  (CRESTOR ) 20 MG tablet Take 1 tablet (20 mg total) by mouth every evening. 90 tablet 4   SYNTHROID  75 MCG tablet TAKE ONE TABLET BY MOUTH Monday-Saturday BEFORE breakfast 80 tablet 4   topiramate  (TOPAMAX ) 25 MG tablet Take 1 tablet (25 mg total) by mouth at bedtime. For headache prevention 30 tablet 6   Blood Glucose Monitoring Suppl DEVI 1 each by Does not apply route in the morning, at noon, and at bedtime. E13.319. May substitute to any manufacturer covered by patient's insurance. 1 each 0   diphenhydrAMINE  (BENADRYL ) 50 MG tablet Take 1 tablet (50 mg total) by mouth once for 1 dose. 1 hour prior to test 1 tablet 0   donepezil  (ARICEPT ) 5 MG tablet Take 5 mg by mouth at bedtime.     glucose blood (ONETOUCH ULTRA) test strip USE THREE TIMES DAILY 100 strip 3   No facility-administered medications prior to visit.     Per HPI unless specifically indicated in ROS section below Review of Systems  Objective:  BP 122/78   Pulse 93   Temp 98 F (36.7 C) (Oral)   Ht 5' 3 (1.6 m)   Wt 141 lb 2 oz (64 kg)   SpO2 94%   BMI 25.00 kg/m   Wt Readings from Last 3 Encounters:  05/17/24 141 lb 2 oz (64 kg)  05/07/24 140 lb 9.6 oz (63.8 kg)  04/30/24 141 lb 4 oz (64.1 kg)      Physical Exam Vitals and nursing note reviewed.  Constitutional:      Appearance: Normal appearance. She is not ill-appearing.  Cardiovascular:     Rate and Rhythm: Normal rate and regular rhythm.     Pulses: Normal pulses.     Heart sounds: Normal heart sounds. No murmur heard. Pulmonary:     Effort: Pulmonary  effort is normal. No respiratory distress.     Breath sounds: Normal breath sounds. No wheezing, rhonchi or rales.  Musculoskeletal:     Right lower leg: No edema.     Left lower leg: No edema.     Comments: See below for foot exam   Skin:    General: Skin is warm and dry.     Findings: No rash.  Neurological:     Mental Status: She is alert.       Diabetic  Foot Exam - Simple   Simple Foot Form Diabetic Foot exam was performed with the following findings: Yes 05/17/2024  4:18 PM  Visual Inspection No deformities, no ulcerations, no other skin breakdown bilaterally: Yes Sensation Testing Intact to touch and monofilament testing bilaterally: Yes Pulse Check Posterior Tibialis and Dorsalis pulse intact bilaterally: Yes Comments No claudication     Results for orders placed or performed during the hospital encounter of 04/22/24  Glucose, capillary   Collection Time: 04/22/24 10:34 AM  Result Value Ref Range   Glucose-Capillary 326 (H) 70 - 99 mg/dL  Glucose, capillary   Collection Time: 04/22/24  2:23 PM  Result Value Ref Range   Glucose-Capillary 290 (H) 70 - 99 mg/dL   *Note: Due to a large number of results and/or encounters for the requested time period, some results have not been displayed. A complete set of results can be found in Results Review.   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 She struggles to record her sugars in one location and then bring in sugar meter or sugar log to office visits.  She notes ongoing high sugars. Last visit we increased  tresiba  10% to 60u daily, continued humalog  mealtime insulin  8-10u TID AC.  With patient's permission I spoke to her sister Cassandra Benson who agrees to help her over the next 4 days to record sugars in 1 location to bring into next office visit. She states she will likely buy new calendar where she can record sugars every day.  She does not currently have and endocrinologist - would again try [facility] if needed (last referred 11/2023).  She has appt next Tuesday in office - advised keep that, bring in sugar log with sister's assistance over this weekend.       MCI (mild cognitive impairment) with memory loss   This contributes to difficulty with glycemic control.  She never completed neurocognitive evaluation scheduled earlier this year.  Continue donepezil  5mg  nightly.       Diabetic peripheral neuropathy [facility])   Describes this, discussed how it contributes to unsteadiness /imbalance.  H/o gabapentin  intolerance - worsened unsteadiness. Could consider low dose gabapentin . Reviewed importance of improved glycemic control to keep neuropathy at bay.      Relevant Medications   donepezil  (ARICEPT ) 5 MG tablet     Meds ordered this encounter  Medications   Blood Glucose Monitoring Suppl (PRODIGY AUTOCODE BLOOD GLUCOSE) DEVI    Sig: 1 Device by Does not apply route daily.   glucose blood (PRODIGY NO CODING BLOOD GLUC) test strip    Sig: Use as instructed    Dispense:  100 each    Refill:  12   donepezil  (ARICEPT ) 5 MG tablet    Sig: Take 1 tablet (5 mg total) by mouth at bedtime.    Dispense:  90 tablet    Refill:  1    No orders of the defined types were placed in this encounter.   Patient Instructions  Current diabetes regimen: Tresiba  60u nightly at 10pm (pens)  Lispro (Humalog ) 8-10u before meals (pens)   Pick up calendar today to start jotting down sugar readings then bring to each visit, starting with next week's appointment on Tuesday. Your sister Cassandra Benson is going to help you  over the weekend.   I have refilled donepezil  5mg  to take nightly.  Good to see you today.  Return on Tuesday for sugar check.   Follow up plan: Return if symptoms worsen or fail to improve.  Anton Blas, MD

## 2024-05-17 NOTE — Assessment & Plan Note (Signed)
 Describes this, discussed how it contributes to unsteadiness /imbalance.  H/o gabapentin  intolerance - worsened unsteadiness. Could consider low dose gabapentin . Reviewed importance of improved glycemic control to keep neuropathy at bay.

## 2024-05-17 NOTE — Assessment & Plan Note (Addendum)
 This contributes to difficulty with glycemic control.  She never completed neurocognitive evaluation scheduled earlier this year.  Continue donepezil  5mg  nightly.

## 2024-05-21 ENCOUNTER — Ambulatory Visit: Admitting: Family Medicine

## 2024-05-21 ENCOUNTER — Telehealth: Payer: Self-pay

## 2024-05-21 ENCOUNTER — Telehealth: Payer: Self-pay | Admitting: Family

## 2024-05-21 NOTE — Telephone Encounter (Signed)
 Patient was scheduled for OV today. Called pt and she states she forgot and would not be able to make it in at all today because she is not feeling well. She states she will have to check her calendar and reschedule later.

## 2024-05-21 NOTE — Telephone Encounter (Signed)
 Called to confirm/remind patient of their appointment at the [facility] on 05/22/24.   Appointment:   [] Confirmed  [x] Left mess   [] No answer/No voice mail  [] VM Full/unable to leave message  [] Phone not in service  Patient reminded to bring all medications and/or complete list.  Confirmed patient has transportation. Gave directions, instructed to utilize valet parking.

## 2024-05-22 ENCOUNTER — Encounter: Admitting: Family

## 2024-05-28 ENCOUNTER — Ambulatory Visit (INDEPENDENT_AMBULATORY_CARE_PROVIDER_SITE_OTHER): Admitting: Family Medicine

## 2024-05-28 VITALS — BP 122/60 | HR 76 | Temp 97.8°F | Ht 63.0 in | Wt 140.5 lb

## 2024-05-28 DIAGNOSIS — G8929 Other chronic pain: Secondary | ICD-10-CM | POA: Diagnosis not present

## 2024-05-28 DIAGNOSIS — G3184 Mild cognitive impairment, so stated: Secondary | ICD-10-CM | POA: Diagnosis not present

## 2024-05-28 DIAGNOSIS — Z79899 Other long term (current) drug therapy: Secondary | ICD-10-CM | POA: Diagnosis not present

## 2024-05-28 DIAGNOSIS — R519 Headache, unspecified: Secondary | ICD-10-CM | POA: Diagnosis not present

## 2024-05-28 DIAGNOSIS — E13319 Other specified diabetes mellitus with unspecified diabetic retinopathy without macular edema: Secondary | ICD-10-CM | POA: Diagnosis not present

## 2024-05-28 MED ORDER — DONEPEZIL HCL 10 MG PO TABS: 10.0000 mg | ORAL_TABLET | Freq: Every day | ORAL | 1 refills | Status: AC

## 2024-05-28 NOTE — Patient Instructions (Addendum)
 Keep CHF clinic appointment Thursday  Trial off topiramate  headache prevention medicine. If headaches worsen, restart.   Increase donepezil  to 10mg  nightly - new dose at pharmacy.   Return in 2-3 weeks for follow up visit, preferably with sugar readings to review.

## 2024-05-28 NOTE — Progress Notes (Unsigned)
 Ph: [phone] Fax: [phone]   Patient ID: Cassandra Benson, female    DOB: 07-05-1941, 83 y.o.   MRN: [id number]  This visit was conducted in person.  BP 122/60   Pulse 76   Temp 97.8 F (36.6 C) (Oral)   Ht 5' 3 (1.6 m)   Wt 140 lb 8 oz (63.7 kg)   SpO2 99%   BMI 24.89 kg/m    CC: diabetes follow up Subjective:   HPI: Cassandra Benson is a 83 y.o. female presenting on 05/28/2024 for Medical Management of Chronic Issues (Pt here for F/U visit Sugar check. Pt states she forgot her sugar log at home.)   See prior note for details. Forgot sugar log at home.  Fasting cbg 97 this morning.  She previously bought [employer]. Did not bring glucometer in today.   Now states Adina and Kate children are helping her buy a new meter where she can record readings electronically to keep better track that way.   Upcoming advanced CHF clinic on Thursday.   Current diabetic regimen: Tresiba  60u nightly at 10pm (pens)  Lispro (Humalog ) 8-10u before meals (pens)   States she wouldn't want to return to see Dr Lane at [facility].  She previously did not want to return to Dr Margaret at [facility].   R sided HA described as intense - sharp stabbing, electrical shock, dull throbbing ache - all of the above.  Inflammatory markers have been normal.      Relevant past medical, surgical, family and social history reviewed and updated as indicated. Interim medical history since our last visit reviewed. Allergies and medications reviewed and updated. Outpatient Medications Prior to Visit  Medication Sig Dispense Refill  . acetaminophen  (TYLENOL ) 500 MG tablet Take 500 mg by mouth every 6 (six) hours as needed for mild pain (pain score 1-3) or headache.    . albuterol  (VENTOLIN  HFA) 108 (90 Base) MCG/ACT inhaler Inhale 2 puffs into the lungs every 6 (six) hours as needed. 8 g 2  . Ascorbic Acid  (VITAMIN C ) 100 MG tablet Take 100 mg by mouth daily.    . aspirin  EC 81 MG tablet  Take 1 tablet (81 mg total) by mouth daily. Swallow whole. 30 tablet 12  . Blood Glucose Monitoring Suppl (PRODIGY AUTOCODE BLOOD GLUCOSE) DEVI 1 Device by Does not apply route daily.    . Cholecalciferol  (VITAMIN D3) 25 MCG (1000 UT) CAPS Take 2 capsules (2,000 Units total) by mouth daily.    . clopidogrel  (PLAVIX ) 75 MG tablet TAKE 1 TABLET(75 MG) BY MOUTH EVERY EVENING 30 tablet 11  . donepezil  (ARICEPT ) 5 MG tablet Take 1 tablet (5 mg total) by mouth at bedtime. 90 tablet 1  . Fluticasone-Umeclidin-Vilant (TRELEGY ELLIPTA ) 100-62.5-25 MCG/ACT AEPB Inhale 1 puff into the lungs daily. 28 each 11  . furosemide  (LASIX ) 20 MG tablet Take 1 tablet (20 mg total) by mouth daily. 30 tablet 3  . Glucagon , rDNA, (GLUCAGON  EMERGENCY) 1 MG KIT INJECT into THE muscle ONCE AS NEEDED FOR emergency 1 kit 12  . glucose blood (PRODIGY NO CODING BLOOD GLUC) test strip Use as instructed 100 each 12  . insulin  degludec (TRESIBA  FLEXTOUCH) 100 UNIT/ML FlexTouch Pen Inject 60 Units into the skin at bedtime. 18 mL 0  . insulin  lispro (HUMALOG ) 100 UNIT/ML injection Inject 0.07 mLs (7 Units total) into the skin 3 (three) times daily before meals. Dose per sliding scale 10 mL 0  . isosorbide  mononitrate (IMDUR ) 30 MG  24 hr tablet Take 1 tablet (30 mg total) by mouth daily. 90 tablet 3  . Lancets (ONETOUCH DELICA PLUS LANCET33G) MISC Apply topically.    . losartan  (COZAAR ) 50 MG tablet Take 50 mg by mouth daily.    . metoprolol  succinate (TOPROL -XL) 25 MG 24 hr tablet Take 25 mg by mouth daily.    . Multiple Vitamin (MULTIVITAMIN ADULT) TABS Take 1 tablet by mouth in the morning and at bedtime.    . Multiple Vitamins-Minerals (PRESERVISION AREDS PO) Take 1 tablet by mouth in the morning and at bedtime.    . nitroGLYCERIN  (NITROSTAT ) 0.4 MG SL tablet Place 1 tablet (0.4 mg total) under the tongue every 5 (five) minutes as needed for chest pain. 25 tablet PRN  . rosuvastatin  (CRESTOR ) 20 MG tablet Take 1 tablet (20 mg  total) by mouth every evening. 90 tablet 4  . SYNTHROID  75 MCG tablet TAKE ONE TABLET BY MOUTH Monday-Saturday BEFORE breakfast 80 tablet 4  . topiramate  (TOPAMAX ) 25 MG tablet Take 1 tablet (25 mg total) by mouth at bedtime. For headache prevention 30 tablet 6   No facility-administered medications prior to visit.     Per HPI unless specifically indicated in ROS section below Review of Systems  Objective:  BP 122/60   Pulse 76   Temp 97.8 F (36.6 C) (Oral)   Ht 5' 3 (1.6 m)   Wt 140 lb 8 oz (63.7 kg)   SpO2 99%   BMI 24.89 kg/m   Wt Readings from Last 3 Encounters:  05/28/24 140 lb 8 oz (63.7 kg)  05/17/24 141 lb 2 oz (64 kg)  05/07/24 140 lb 9.6 oz (63.8 kg)      Physical Exam Vitals and nursing note reviewed.  Constitutional:      Appearance: Normal appearance. She is not ill-appearing.  HENT:     Head: Normocephalic and atraumatic.     Mouth/Throat:     Mouth: Mucous membranes are moist.     Pharynx: Oropharynx is clear. No oropharyngeal exudate or posterior oropharyngeal erythema.  Eyes:     Extraocular Movements: Extraocular movements intact.     Conjunctiva/sclera: Conjunctivae normal.     Pupils: Pupils are equal, round, and reactive to light.  Cardiovascular:     Rate and Rhythm: Normal rate and regular rhythm.     Pulses: Normal pulses.     Heart sounds: Normal heart sounds. No murmur heard. Pulmonary:     Effort: Pulmonary effort is normal. No respiratory distress.     Breath sounds: Normal breath sounds. No wheezing, rhonchi or rales.     Comments: R posterior back diffusely tenderness to palpation  Chest:     Chest wall: Tenderness present.  Musculoskeletal:     Cervical back: Normal range of motion and neck supple.     Right lower leg: No edema.     Left lower leg: No edema.  Lymphadenopathy:     Cervical: No cervical adenopathy.  Skin:    General: Skin is warm and dry.     Findings: No rash.  Neurological:     Mental Status: She is alert.   Psychiatric:        Mood and Affect: Mood normal.        Behavior: Behavior normal.       Results for orders placed or performed during the hospital encounter of 04/22/24  Glucose, capillary   Collection Time: 04/22/24 10:34 AM  Result Value Ref Range   Glucose-Capillary 326 (  H) 70 - 99 mg/dL  Glucose, capillary   Collection Time: 04/22/24  2:23 PM  Result Value Ref Range   Glucose-Capillary 290 (H) 70 - 99 mg/dL   *Note: Due to a large number of results and/or encounters for the requested time period, some results have not been displayed. A complete set of results can be found in Results Review.   Lab Results  Component Value Date   HGBA1C 11.3 (A) 03/18/2024   Assessment & Plan:   Problem List Items Addressed This Visit   None    No orders of the defined types were placed in this encounter.   No orders of the defined types were placed in this encounter.   Patient Instructions  Keep CHF clinic appointment Thursday I have placed new referral to [facility] in [place].   Trial off topiramate  headache prevention medicine. If headaches worsen, restart.  Continue donepezil  5mg  nightly.   Follow up plan: No follow-ups on file.  Anton Blas, MD

## 2024-05-29 ENCOUNTER — Telehealth: Payer: Self-pay | Admitting: Family

## 2024-05-29 ENCOUNTER — Encounter: Payer: Self-pay | Admitting: Family Medicine

## 2024-05-29 NOTE — Telephone Encounter (Signed)
 Called to confirm/remind patient of their appointment at the [facility] on 05/30/24.   Appointment:   [x] Confirmed  [] Left mess   [] No answer/No voice mail  [] VM Full/unable to leave message  [] Phone not in service  Patient reminded to bring all medications and/or complete list.  Confirmed patient has transportation. Gave directions, instructed to utilize valet parking.

## 2024-05-29 NOTE — Assessment & Plan Note (Signed)
 She states she tolerating donepezil  5mg  nightly well - will increase to 10mg  nightly.  Stop topamax  as per below.

## 2024-05-29 NOTE — Progress Notes (Unsigned)
 [facility] Note    PCP: Rilla Baller, MD  Primary Cardiologist: Perla Lye, MD  HF provider: Gardenia Led, MD   Chief Complaint: shortness of breath   HPI:  Cassandra Benson is a 83 yo female with a PMHx significant for HTN, HLD, hypothyroidism, asthma, CAD, LADA, HA's, fibromyalgia, PAD, vasospasm, Sjogren's syndrome, and CHF.   Echo 07/30/20: EF of 55-60% with grade II diastolic dysfunction. Echo 12/24/21: EF of 60-65% along with mild MR.  Was in the ED 11/16/22 due to SOB due to HF exacerbation. Was in the ED 01/13/23 due to SOB/ pedal edema. Elevated D-dimer. VQ scan negative for PE.   RHC 12/02/22: No critical coronary artery disease. The is up to ~50% stenosis at the ostium of the RCA with pressure dampening noted using 63F diagnostic catheter.  Slight improvement noted with intracoronary nitroglycerin  suggesting at least some component of vasospasm.  No angiographically significant coronary artery disease noted in the left coronary artery. Normal left and right heart filling pressures. Normal Fick cardiac output/index. Small right radial artery with significant vasospasm throughout the procedure.  Recommend alternate access for future catheterizations. Recommendations: Add isosorbide  mononitrate 15 mg daily and as needed sublingual nitroglycerin  for antianginal therapy, including possible coronary vasospasm.  Echo 12/23/22: EF 60-65% with Grade I DD.   Was in the ED 03/14/23 due to chest pain that woke her from sleep associated with chest pressure and heaviness in both sides of her chest. EKG, CXR and labs were unremarkable and she was released with improvement of pain.   Was in the ED 07/20/23 due to shortness of breath & chest pain. EKG normal. CXR nl. D dimer elevated but VQ scan negative for PE.   Admitted 11/30/23 with near syncope while walking. Received IV fluid bolus, held antihypertensives and insulin  with improvement in blood pressure. Nothing  observed on telemetry. MRI showing no acute findings. Chest x-ray noted no consolidations. Upon discharge decrease dose of losartan  and stop beta-blocker. Will also decrease nightly insulin  from 60 units down to 30 units. Discharged the next day.   Echo 12/01/23: EF 60-65%, G1DD, normal RV, normal PA pressure  Admitted 03/08/24 with chest pain. On the night before admission patient developed nausea, vomiting and diarrhea lasted the whole night, resolved next morning. Later in the morning she developed chest pain. No associated symptoms. On presentation vital stable, labs with elevated D-dimer at 0.83. Mild leukocytosis at 11.7 and mild hypokalemia. Chest x-ray negative for acute abnormalities. Troponin remained negative x 2 and EKG with no acute ST changes. Pain seems reproducible likely costochondritis. CTA 03/09/24 was negative for any PE or other acute abnormality. Potassium corrected.   Was in the ED 04/03/24 with glucose 300-400 range. IVF and SQ insulin  given with improvement of glucose. No UTI, DKA. CXR negative.   Was in the ED 04/05/24 with chest pain and shortness of breath. Continues with elevated glucose. No DKA. Cardiology consulted.   LHC 04/22/24: EF 55-65%. Mild nonobstructive coronary artery disease. The ostial RCA stenosis appears to be less than seen last year. Suspect there was catheter induced spasm. Normal LV systolic function. Moderately elevated left ventricular end-diastolic pressure at 24 mmHg.   She presents today for a HF f/u visit with a chief complaint of shortness of breath. Has associated fatigue, intermittent chest pain, palpitations with exertion (sweeping the floor), dizziness, occasional pedal edema. She says that this morning she had an episode of chest pain that started in her left upper anterior chest wall  and then went across her chest to her right shoulder. Resolved after a few minutes. Had a repeat cath back in July which showed mild nonobstructive CAD. Has been taking  isosorbide  daily and has SL NTG for PRN use. Has been to ED many times with this same pain and work up has been negative/ unrevealing.   ROS: All systems negative except as listed in HPI, PMH and Problem List.  SH:  Social History   Socioeconomic History   Marital status: Widowed    Spouse name: Not on file   Number of children: 2   Years of education: Not on file   Highest education level: 12th grade  Occupational History   Occupation: retired  Tobacco Use   Smoking status: Never   Smokeless tobacco: Never  Vaping Use   Vaping status: Never Used  Substance and Sexual Activity   Alcohol use: No   Drug use: No   Sexual activity: Not on file  Other Topics Concern   Not on file  Social History Narrative   Lives in [place], moved from [place].    Widow - husband decreased 01/2016 of metastatic colon CA   No pets.   Son Estreya Clay lives nearby. Daughter lives in [place] . Sister lives 2 blocks away.    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, [employer]   Exercise - limited   Diet - good water, fruits/vegetables daily, limited meat, protein drink every morning   Social Drivers of Corporate investment banker Strain: Medium Risk (03/26/2024)   Overall Financial Resource Strain (CARDIA)    Difficulty of Paying Living Expenses: Somewhat hard  Food Insecurity: No Food Insecurity (03/27/2024)   Hunger Vital Sign    Worried About Running Out of Food in the Last Year: Never true    Ran Out of Food in the Last Year: Never true  Transportation Needs: No Transportation Needs (03/27/2024)   PRAPARE - Administrator, Civil Service (Medical): No    Lack of Transportation (Non-Medical): No  Physical Activity: Insufficiently Active (10/31/2023)   Exercise Vital Sign    Days of Exercise per Week: 3 days    Minutes of Exercise per Session: 20 min  Stress: No Stress Concern Present (10/31/2023)    [employer] of Occupational Health - Occupational Stress Questionnaire    Feeling of Stress : Only a little  Social Connections: Moderately Integrated (03/08/2024)   Social Connection and Isolation Panel    Frequency of Communication with Friends and Family: Three times a week    Frequency of Social Gatherings with Friends and Family: Three times a week    Attends Religious Services: More than 4 times per year    Active Member of Clubs or Organizations: Yes    Attends Banker Meetings: More than 4 times per year    Marital Status: Widowed  Intimate Partner Violence: Not At Risk (03/27/2024)   Humiliation, Afraid, Rape, and Kick questionnaire    Fear of Current or Ex-Partner: No    Emotionally Abused: No    Physically Abused: No    Sexually Abused: No    FH:  Family History  Problem Relation Age of Onset   CAD Mother 98       MI, aortic valve issues   COPD Mother    Lupus Mother    Yvone' disease Mother    Rheum arthritis  Mother    CAD Father 44       CABG x2, aortic valve replacement   Stroke Sister    CAD Sister    Anuerysm Sister        brain   Lupus Sister    Diabetes Sister    Diabetes Sister    Breast cancer Sister    Alcohol abuse Brother    CAD Brother 41       MI   Stroke Brother    COPD Brother        agent orange   CAD Brother 60       stent   Diabetes Brother    Stroke Maternal Grandmother    Hypertension Maternal Grandmother    Gallbladder disease Maternal Grandmother    Breast cancer Maternal Aunt    Breast cancer Maternal Aunt    Depression Grandchild    Colon cancer Neg Hx    Esophageal cancer Neg Hx    Rectal cancer Neg Hx    Stomach cancer Neg Hx     Past Medical History:  Diagnosis Date   Acute diverticulitis 04/2021   [facility] ER, CT confirmed   Allergy    ANA positive    positive ANA pattern 1 speckled   Arthritis    Carotid stenosis, asymptomatic 06/19/2015   1-39% RICA 40-59% LICA rpt 1 yr (05/2015)    CHF  (congestive heart failure) (HCC)    Colon polyps    COVID-19 virus infection 09/14/2021   Dermatomyositis (HCC)    Diabetes mellitus without complication (HCC)    Type 1   Diverticulosis    sigmoid on CT scan 12/2019   Family history of adverse reaction to anesthesia    brothr went into cardiac arrest from anectine   Fibromyalgia    prior PCP   GERD (gastroesophageal reflux disease)    prior PCP   Glaucoma    Narrow angle   History of blood clots    DVT, in 20s, none since   History of chicken pox    History of diverticulitis    History of pericarditis 1986   with hospitalization   History of pneumonia 2014   History of shingles    History of UTI    Hyperlipidemia    Hypertension    Hypothyroidism    Mixed connective tissue disease (HCC)    Partial small bowel obstruction (HCC) 12/2019   managed conservatively   Peptic ulcer    Pneumonia    PONV (postoperative nausea and vomiting)    Raynaud's disease without gangrene    Shoulder pain left   h/o RTC tendonitis and adhesive capsulitis   Sigmoid diverticulitis 05/26/2021   Sjogren's syndrome (HCC)    Sleep apnea    prior PCP - no CPAP for about 10 yrs   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HCC)    Vitamin D  deficiency    prior PCP    Current Outpatient Medications  Medication Sig Dispense Refill   acetaminophen  (TYLENOL ) 500 MG tablet Take 500 mg by mouth every 6 (six) hours as needed for mild pain (pain score 1-3) or headache.     albuterol  (VENTOLIN  HFA) 108 (90 Base) MCG/ACT inhaler Inhale 2 puffs into the lungs every 6 (six) hours as needed. 8 g 2   Ascorbic Acid  (VITAMIN C ) 100 MG tablet Take 100 mg by mouth daily.     aspirin  EC 81 MG tablet Take 1 tablet (81 mg total) by mouth daily. Swallow whole. 30 tablet 12  Blood Glucose Monitoring Suppl (PRODIGY AUTOCODE BLOOD GLUCOSE) DEVI 1 Device by Does not apply route daily.     Cholecalciferol  (VITAMIN D3) 25 MCG (1000 UT) CAPS Take 2 capsules (2,000 Units total) by mouth daily.      clopidogrel  (PLAVIX ) 75 MG tablet TAKE 1 TABLET(75 MG) BY MOUTH EVERY EVENING 30 tablet 11   donepezil  (ARICEPT ) 10 MG tablet Take 1 tablet (10 mg total) by mouth at bedtime. 90 tablet 1   Fluticasone-Umeclidin-Vilant (TRELEGY ELLIPTA ) 100-62.5-25 MCG/ACT AEPB Inhale 1 puff into the lungs daily. 28 each 11   furosemide  (LASIX ) 20 MG tablet Take 1 tablet (20 mg total) by mouth daily. 30 tablet 3   Glucagon , rDNA, (GLUCAGON  EMERGENCY) 1 MG KIT INJECT into THE muscle ONCE AS NEEDED FOR emergency 1 kit 12   glucose blood (PRODIGY NO CODING BLOOD GLUC) test strip Use as instructed 100 each 12   insulin  degludec (TRESIBA  FLEXTOUCH) 100 UNIT/ML FlexTouch Pen Inject 60 Units into the skin at bedtime. 18 mL 0   insulin  lispro (HUMALOG ) 100 UNIT/ML injection Inject 0.07 mLs (7 Units total) into the skin 3 (three) times daily before meals. Dose per sliding scale 10 mL 0   isosorbide  mononitrate (IMDUR ) 30 MG 24 hr tablet Take 1 tablet (30 mg total) by mouth daily. 90 tablet 3   Lancets (ONETOUCH DELICA PLUS LANCET33G) MISC Apply topically.     losartan  (COZAAR ) 50 MG tablet Take 50 mg by mouth daily.     metoprolol  succinate (TOPROL -XL) 25 MG 24 hr tablet Take 25 mg by mouth daily.     Multiple Vitamin (MULTIVITAMIN ADULT) TABS Take 1 tablet by mouth in the morning and at bedtime.     Multiple Vitamins-Minerals (PRESERVISION AREDS PO) Take 1 tablet by mouth in the morning and at bedtime.     nitroGLYCERIN  (NITROSTAT ) 0.4 MG SL tablet Place 1 tablet (0.4 mg total) under the tongue every 5 (five) minutes as needed for chest pain. 25 tablet PRN   rosuvastatin  (CRESTOR ) 20 MG tablet Take 1 tablet (20 mg total) by mouth every evening. 90 tablet 4   SYNTHROID  75 MCG tablet TAKE ONE TABLET BY MOUTH Monday-Saturday BEFORE breakfast 80 tablet 4   topiramate  (TOPAMAX ) 25 MG tablet Take 1 tablet (25 mg total) by mouth at bedtime. For headache prevention 30 tablet 6   No current facility-administered medications for  this visit.   Vitals:   05/30/24 1459  BP: 135/63  Pulse: 73  SpO2: 99%  Weight: 143 lb (64.9 kg)   Wt Readings from Last 3 Encounters:  05/30/24 143 lb (64.9 kg)  05/28/24 140 lb 8 oz (63.7 kg)  05/17/24 141 lb 2 oz (64 kg)   Lab Results  Component Value Date   CREATININE 0.91 04/10/2024   CREATININE 0.78 04/05/2024   CREATININE 0.83 04/03/2024    PHYSICAL EXAM:  General: Well appearing.  Cor: No JVD. Regular rhythm, rate.  Lungs: clear Abdomen: soft, nontender, nondistended. Extremities: 1+ pitting edema around bilateral ankles Neuro:. Affect pleasant  ECG: NSR with PVC's, HR 72 (personally reviewed)    ASSESSMENT & PLAN:  NICM with preserved EF- - likely d/t HTN as cath showed no critical CAD - NYHA III - minimally fluid up with pedal edema - weight down 6 pounds from last visit here 6 months ago - Echo 07/30/20: EF of 55-60% with grade II diastolic dysfunction.  - Echo 6/68/76: EF of 60-65% along with mild MR.  - Echo 12/23/22: EF 60-65% with  Grade I DD.  - Echo 12/01/23: EF 60-65%, G1DD, normal RV, normal PA pressure - adhering to low sodium diet and fluid restriction - continue furosemide  20mg  daily although can take an extra 20mg  dose X 2 days - continue losartan  50mg  daily - continue metoprolol  succinate 25mg  daily  - valsartan caused hacking cough - can not add SGLT2 due to LADA - saw HF provider Marcelina) 03/24 - encouraged to wear compression socks daily and elevate legs when sitting for long periods  - BNP 04/05/24 was 88.3. BNP today  2. HTN - BP 135/63 - saw PCP Achilles) 09/25 - BMP 04/10/24 reviewed: sodium 138, potassium 3.6, creatinine 0.91 & GFR >60  - BMET today  3: CAD- - EKG today is NSR with PVC's (personally reviewed) - no current chest pain and she says that her pain this morning was the same pain she has been having. Discussed going to the ER for further workup but she defers. Advised that if pain changes, becomes more frequent or  she has other symptoms at the time of the pain, to call 911. Patient verbalizes that she will do that.  - RHC 12/02/22:   No critical coronary artery disease. The is up to ~50% stenosis at the ostium of the RCA with pressure dampening noted using 69F diagnostic catheter. Slight improvement noted with intracoronary nitroglycerin  suggesting at least some component of vasospasm. No angiographically significant coronary artery disease noted in the left coronary artery. Normal left and right heart filling pressures. Normal Fick cardiac output/index. Small right radial artery with significant vasospasm throughout the procedure.  - saw cardiology Dene) 08/25; returns in 2 more weeks - LHC 04/22/24: EF 55-65%. Mild nonobstructive coronary artery disease. The ostial RCA stenosis appears to be less than seen last year. Suspect there was catheter induced spasm. Normal LV systolic function. Moderately elevated left ventricular end-diastolic pressure at 24 mmHg.  4. LADA - saw endocrinology Johnnie) 03/25 - A1c 03/18/24 reviewed and was 11.3%  5: Mild obstructive sleep apnea - saw pulmonology Herlene) 04/25 - home sleep study done 09/22/22 which showed mild obstructive sleep apnea with AHI of 8.5 and oxygen saturations as low as 82% nocturnally. Patient declined CPAP/oxygen.  - PFT's 04/22/22  6: Hyperlipidemia- - continue rosuvastatin  20mg  daily - LDL 10/09/23 reviewed and was elevated at 126 & has trended up over the last 2 years   Return in 3 months, sooner if needed.   Ellouise DELENA Class, FNP 05/29/24

## 2024-05-29 NOTE — Assessment & Plan Note (Addendum)
 See prior plan - she brings in calendar but did not record sugar readings on calendar. Sister did not help record readings.  Again, unable to titrate insulin  regimen safely without having cbg data to review.  Latest fasting cbg this morning 97 per pt report.  She had just bought Prodigy glucometer. She has not brought in Prodigy meter to review data however. She now states children are helping her get new meter where she can records readings electronically.  Will schedule RTC 2-3 wks with sugar log to review.  Consider [facility] endo referral.

## 2024-05-29 NOTE — Assessment & Plan Note (Addendum)
 Chronic issue.  Saw neurology - ?trigeminal neuralgia.  Topamax  hasn't been all that helpful - will trial off to see effect on memory.  She states she does not want to return to Dr Lane.

## 2024-05-30 ENCOUNTER — Ambulatory Visit: Attending: Family | Admitting: Family

## 2024-05-30 ENCOUNTER — Encounter: Payer: Self-pay | Admitting: Family

## 2024-05-30 VITALS — BP 135/63 | HR 73 | Wt 143.0 lb

## 2024-05-30 DIAGNOSIS — M797 Fibromyalgia: Secondary | ICD-10-CM | POA: Diagnosis not present

## 2024-05-30 DIAGNOSIS — G4733 Obstructive sleep apnea (adult) (pediatric): Secondary | ICD-10-CM | POA: Diagnosis not present

## 2024-05-30 DIAGNOSIS — E782 Mixed hyperlipidemia: Secondary | ICD-10-CM | POA: Diagnosis not present

## 2024-05-30 DIAGNOSIS — I493 Ventricular premature depolarization: Secondary | ICD-10-CM | POA: Insufficient documentation

## 2024-05-30 DIAGNOSIS — I5032 Chronic diastolic (congestive) heart failure: Secondary | ICD-10-CM | POA: Insufficient documentation

## 2024-05-30 DIAGNOSIS — I110 Hypertensive heart disease with heart failure: Secondary | ICD-10-CM | POA: Diagnosis not present

## 2024-05-30 DIAGNOSIS — I10 Essential (primary) hypertension: Secondary | ICD-10-CM

## 2024-05-30 DIAGNOSIS — I428 Other cardiomyopathies: Secondary | ICD-10-CM | POA: Diagnosis not present

## 2024-05-30 DIAGNOSIS — Z794 Long term (current) use of insulin: Secondary | ICD-10-CM | POA: Insufficient documentation

## 2024-05-30 DIAGNOSIS — I25118 Atherosclerotic heart disease of native coronary artery with other forms of angina pectoris: Secondary | ICD-10-CM | POA: Diagnosis not present

## 2024-05-30 DIAGNOSIS — Z79899 Other long term (current) drug therapy: Secondary | ICD-10-CM | POA: Insufficient documentation

## 2024-05-30 DIAGNOSIS — E785 Hyperlipidemia, unspecified: Secondary | ICD-10-CM | POA: Insufficient documentation

## 2024-05-30 DIAGNOSIS — E109 Type 1 diabetes mellitus without complications: Secondary | ICD-10-CM | POA: Insufficient documentation

## 2024-05-30 DIAGNOSIS — M3500 Sicca syndrome, unspecified: Secondary | ICD-10-CM | POA: Insufficient documentation

## 2024-05-30 DIAGNOSIS — J45909 Unspecified asthma, uncomplicated: Secondary | ICD-10-CM | POA: Diagnosis not present

## 2024-05-30 DIAGNOSIS — E039 Hypothyroidism, unspecified: Secondary | ICD-10-CM | POA: Diagnosis not present

## 2024-05-30 DIAGNOSIS — E139 Other specified diabetes mellitus without complications: Secondary | ICD-10-CM | POA: Diagnosis not present

## 2024-05-30 NOTE — Patient Instructions (Signed)
 Medication Changes:  Take 1 extra tab of Furosemide  TODAY AND TOMORROW ONLY  Lab Work:  Go over to the [facility]. Go pass the gift shop and have your blood work completed.  We will only call you if the results are abnormal or if the provider would like to make medication changes.  No news is good news.   Follow-Up in: Please follow up with the [facility] in 3 months with [name], FNP.   Thank you for choosing  [facility].    At the [facility] Clinic, you and your health needs are our priority. We have a designated team specialized in the treatment of Heart Failure. This Care Team includes your primary Heart Failure Specialized Cardiologist (physician), Advanced Practice Providers (APPs- Physician Assistants and Nurse Practitioners), and Pharmacist who all work together to provide you with the care you need, when you need it.   You may see any of the following providers on your designated Care Team at your next follow up:  Dr. Toribio Fuel Dr. Ezra Shuck Dr. Ria Commander Dr. Morene Brownie [name], FNP Jaun Bash, RPH-CPP  Please be sure to bring in all your medications bottles to every appointment.   Need to Contact Us :  If you have any questions or concerns before your next appointment please send us  a message through [place] or call our office at [phone].    TO LEAVE A MESSAGE FOR THE NURSE SELECT OPTION 2, PLEASE LEAVE A MESSAGE INCLUDING: YOUR NAME DATE OF BIRTH CALL BACK NUMBER REASON FOR CALL**this is important as we prioritize the call backs  YOU WILL RECEIVE A CALL BACK THE SAME DAY AS LONG AS YOU CALL BEFORE 4:00 PM

## 2024-06-03 ENCOUNTER — Telehealth: Payer: Self-pay

## 2024-06-03 NOTE — Telephone Encounter (Signed)
 Copied from CRM [id number]. Topic: Clinical - Medical Advice >> Jun 03, 2024  8:35 AM [name] GRADE wrote: Reason for CRM: Patient was diagnosed with stage 4 congestive heart failure. She was calling to advise Dr.Gutierrez of this diagnosis. She was referred to a few specialist as well.

## 2024-06-04 NOTE — Telephone Encounter (Unsigned)
 Copied from CRM [id number]. Topic: Clinical - Medical Advice >> Jun 04, 2024  4:16 PM Dedra B wrote: Reason for CRM: Pt called to get Dr. Orlie 's opinion on two cardiologists that were recommended to her, Dr. Toribio Fuel or Dr. Ezra Shuck. She said the recommendations came after her meeting last week with Ellouise [facility] where she was told her CHF is getting worse. Pt does not wish to make a decision on the cardiologist without first speaking to her Dr. Rilla.

## 2024-06-04 NOTE — Telephone Encounter (Unsigned)
 Copied from CRM [id number]. Topic: Clinical - Medical Advice >> Jun 03, 2024  8:35 AM [name] GRADE wrote: Reason for CRM: Patient was diagnosed with stage 4 congestive heart failure. She was calling to advise Dr.Gutierrez of this diagnosis. She was referred to a few specialist as well. >> Jun 04, 2024  8:11 AM Thersia BROCKS wrote: Patient called in regarding needing Dr.Gutierrez advice would like for him to give her a callback as soon as he can

## 2024-06-05 ENCOUNTER — Telehealth: Payer: Self-pay | Admitting: Family Medicine

## 2024-06-05 NOTE — Telephone Encounter (Signed)
 Pt states she would like your approval on seeing Dr. Duanne for CHF.

## 2024-06-05 NOTE — Telephone Encounter (Unsigned)
 Copied from CRM #[id number]. Topic: General - Other >> Jun 05, 2024  3:15 PM Delon DASEN wrote: Reason for CRM: returning call from office- [phone]

## 2024-06-05 NOTE — Telephone Encounter (Signed)
 Copied from CRM [id number]. Topic: General - Other >> Jun 05, 2024  8:13 AM Mia F wrote: Reason for CRM: Pt would like a call from Dr Duanne. She would like to discuss her medical conditions with him. PT did not want an appt as she would rather wait until she talks to him first. She says she trusts Dr Duanne and wants to talk to him about appointments she need and her 5 health conditions. She asks for him to call her by phone.

## 2024-06-05 NOTE — Telephone Encounter (Signed)
 Spoke with patient.

## 2024-06-05 NOTE — Telephone Encounter (Signed)
 Spoke with patient. I highly recommend both of these doctors at advanced CHF clinic.

## 2024-06-07 NOTE — Telephone Encounter (Unsigned)
 Copied from CRM #[id number]. Topic: General - Call Back - No Documentation >> Jun 07, 2024  1:01 PM Dedra B wrote: Reason for CRM: Pt said she had a missed call from office. Didn't see any documentation of call. Pls call pt.

## 2024-06-07 NOTE — Telephone Encounter (Signed)
 Called patient let her know we don't have any notes open. Think it may have been a reminder call from the other upcoming appointment. She will call if any questions.

## 2024-06-10 NOTE — Progress Notes (Unsigned)
 Patient name: Cassandra Benson MRN: [id number] DOB: [birth year] Sex: female  REASON FOR VISIT: 1 year follow-up carotid surveillance, previous left TCAR  HPI: Cassandra Benson is a 83 y.o. female with hx DM, CHF, HTN, HLD that presents for one year follow-up of her carotid disease.  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  She remains on aspirin  Plavix  statin.    Very concerned today with new diagnosis of type 1 diabetes and heart failure.  Heart failure is being treated with medical therapy including Lasix .   Past Medical History:  Diagnosis Date   Acute diverticulitis 04/2021   [facility] ER, CT confirmed   Allergy    ANA positive    positive ANA pattern 1 speckled   Arthritis    Carotid stenosis, asymptomatic 06/19/2015   1-39% RICA 40-59% LICA rpt 1 yr (05/2015)    CHF (congestive heart failure) (HCC)    Colon polyps    COVID-19 virus infection 09/14/2021   Dermatomyositis (HCC)    Diabetes mellitus without complication (HCC)    Type 1   Diverticulosis    sigmoid on CT scan 12/2019   Family history of adverse reaction to anesthesia    brothr went into cardiac arrest from anectine   Fibromyalgia    prior PCP   GERD (gastroesophageal reflux disease)    prior PCP   Glaucoma    Narrow angle   History of blood clots    DVT, in 20s, none since   History of chicken pox    History of diverticulitis    History of pericarditis 1986   with hospitalization   History of pneumonia 2014   History of shingles    History of UTI    Hyperlipidemia    Hypertension    Hypothyroidism    Mixed connective tissue disease (HCC)    Partial small bowel obstruction (HCC) 12/2019   managed conservatively   Peptic ulcer    Pneumonia    PONV (postoperative nausea and vomiting)    Raynaud's disease without gangrene    Shoulder pain left   h/o RTC tendonitis and adhesive capsulitis   Sigmoid  diverticulitis 05/26/2021   Sjogren's syndrome (HCC)    Sleep apnea    prior PCP - no CPAP for about 10 yrs   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HCC)    Vitamin D  deficiency    prior PCP    Past Surgical History:  Procedure Laterality Date   ABDOMINAL HYSTERECTOMY  1978   fibroids and menorrhagia, ovaries remain   ARTERY BIOPSY Right 04/06/2018   Procedure: BIOPSY TEMPORAL ARTERY RIGHT;  Surgeon: Herminio Miu, MD;  Location: [facility];  Service: ENT;  Laterality: Right;  Diabetic - insulin  pump sleep apnea   CARDIAC CATHETERIZATION  2000   [facility] normal per patient   COLONOSCOPY  10/2011   1 TA, 1 HP, very tortuous colon (Lawal)   COLONOSCOPY  02/2020   TA, inflammatory polyp, mod diverticulosis, int/ext hemorrhoids (Pyrtle) no rpt recommended    COLONOSCOPY WITH ESOPHAGOGASTRODUODENOSCOPY (EGD)  03/2007   2 ulcers, benign polyp, rpt 5 yrs [facility])   LEFT HEART CATH AND CORONARY ANGIOGRAPHY Left 04/22/2024   Procedure: LEFT HEART CATH AND CORONARY ANGIOGRAPHY;  Surgeon: Darron Deatrice LABOR, MD;  Location: [facility];  Service: Cardiovascular;  Laterality: Left;   PARTIAL HIP ARTHROPLASTY Right 2013   hip replacement  RIGHT HEART CATH AND CORONARY ANGIOGRAPHY Bilateral 12/02/2022   Procedure: RIGHT HEART CATH AND CORONARY ANGIOGRAPHY;  Surgeon: Mady Bruckner, MD;  Location: [facility];  Service: Cardiovascular;  Laterality: Bilateral;   TONSILLECTOMY     TONSILLECTOMY AND ADENOIDECTOMY     TRANSCAROTID ARTERY REVASCULARIZATION  Left 07/23/2018   Procedure: TRANSCAROTID ARTERY REVASCULARIZATION;  Surgeon: Gretta Bruckner PARAS, MD;  Location: [facility] OR;  Service: Vascular;  Laterality: Left;   TUBAL LIGATION     VAGINAL DELIVERY     x2, no complications    Family History  Problem Relation Age of Onset   CAD Mother 21       MI, aortic valve issues   COPD Mother    Lupus Mother    Yvone' disease Mother    Rheum arthritis Mother    CAD  Father 42       CABG x2, aortic valve replacement   Stroke Sister    CAD Sister    Anuerysm Sister        brain   Lupus Sister    Diabetes Sister    Diabetes Sister    Breast cancer Sister    Alcohol abuse Brother    CAD Brother 29       MI   Stroke Brother    COPD Brother        agent orange   CAD Brother 60       stent   Diabetes Brother    Stroke Maternal Grandmother    Hypertension Maternal Grandmother    Gallbladder disease Maternal Grandmother    Breast cancer Maternal Aunt    Breast cancer Maternal Aunt    Depression Grandchild    Colon cancer Neg Hx    Esophageal cancer Neg Hx    Rectal cancer Neg Hx    Stomach cancer Neg Hx     SOCIAL HISTORY: Social History   Tobacco Use   Smoking status: Never   Smokeless tobacco: Never  Substance Use Topics   Alcohol use: No    Allergies  Allergen Reactions   Iodinated Contrast Media Other (See Comments)    Itching (severe) and chest tightness, Rash   Penicillins Anaphylaxis, Swelling and Rash        Amlodipine  Swelling    Pedal edema   Carvedilol  Dermatitis    Facial acne   Codeine Nausea Only   Gabapentin  Other (See Comments)    Gait abnormality   Influenza Vaccines Other (See Comments)    Muscle weakness; unable to walk   Insulin  Aspart (Human Analog) (Yeast) Other (See Comments)    headache   Nortriptyline  Other (See Comments)    Eye swelling and facial paralysis   Prednisone      Marked severe hyperglycemia to oral and IM steroids   Valsartan Other (See Comments) and Cough    Allergy to generic only, Hacking cough   Zetia  [Ezetimibe ] Other (See Comments)    Bad muscle cramps   Anectine [Succinylcholine] Rash    Brother went into cardiac arrest.   Erythromycin Rash and Swelling   Sulfa Antibiotics Rash    Current Outpatient Medications  Medication Sig Dispense Refill   acetaminophen  (TYLENOL ) 500 MG tablet Take 500 mg by mouth every 6 (six) hours as needed for mild pain (pain score 1-3) or  headache.     albuterol  (VENTOLIN  HFA) 108 (90 Base) MCG/ACT inhaler Inhale 2 puffs into the lungs every 6 (six) hours as needed. 8 g 2   Ascorbic  Acid (VITAMIN C ) 100 MG tablet Take 100 mg by mouth daily.     aspirin  EC 81 MG tablet Take 1 tablet (81 mg total) by mouth daily. Swallow whole. 30 tablet 12   Blood Glucose Monitoring Suppl (PRODIGY AUTOCODE BLOOD GLUCOSE) DEVI 1 Device by Does not apply route daily.     Cholecalciferol  (VITAMIN D3) 25 MCG (1000 UT) CAPS Take 2 capsules (2,000 Units total) by mouth daily.     clopidogrel  (PLAVIX ) 75 MG tablet TAKE 1 TABLET(75 MG) BY MOUTH EVERY EVENING 30 tablet 11   donepezil  (ARICEPT ) 10 MG tablet Take 1 tablet (10 mg total) by mouth at bedtime. 90 tablet 1   Fluticasone-Umeclidin-Vilant (TRELEGY ELLIPTA ) 100-62.5-25 MCG/ACT AEPB Inhale 1 puff into the lungs daily. 28 each 11   furosemide  (LASIX ) 20 MG tablet Take 1 tablet (20 mg total) by mouth daily. 30 tablet 3   Glucagon , rDNA, (GLUCAGON  EMERGENCY) 1 MG KIT INJECT into THE muscle ONCE AS NEEDED FOR emergency 1 kit 12   glucose blood (PRODIGY NO CODING BLOOD GLUC) test strip Use as instructed 100 each 12   insulin  degludec (TRESIBA  FLEXTOUCH) 100 UNIT/ML FlexTouch Pen Inject 60 Units into the skin at bedtime. 18 mL 0   insulin  lispro (HUMALOG ) 100 UNIT/ML injection Inject 0.07 mLs (7 Units total) into the skin 3 (three) times daily before meals. Dose per sliding scale 10 mL 0   isosorbide  mononitrate (IMDUR ) 30 MG 24 hr tablet Take 1 tablet (30 mg total) by mouth daily. 90 tablet 3   Lancets (ONETOUCH DELICA PLUS LANCET33G) MISC Apply topically.     losartan  (COZAAR ) 50 MG tablet Take 50 mg by mouth daily.     metoprolol  succinate (TOPROL -XL) 25 MG 24 hr tablet Take 25 mg by mouth daily.     Multiple Vitamin (MULTIVITAMIN ADULT) TABS Take 1 tablet by mouth in the morning and at bedtime.     Multiple Vitamins-Minerals (PRESERVISION AREDS PO) Take 1 tablet by mouth in the morning and at bedtime.      nitroGLYCERIN  (NITROSTAT ) 0.4 MG SL tablet Place 1 tablet (0.4 mg total) under the tongue every 5 (five) minutes as needed for chest pain. 25 tablet PRN   rosuvastatin  (CRESTOR ) 20 MG tablet Take 1 tablet (20 mg total) by mouth every evening. 90 tablet 4   SYNTHROID  75 MCG tablet TAKE ONE TABLET BY MOUTH Monday-Saturday BEFORE breakfast 80 tablet 4   topiramate  (TOPAMAX ) 25 MG tablet Take 1 tablet (25 mg total) by mouth at bedtime. For headache prevention 30 tablet 6   No current facility-administered medications for this visit.    REVIEW OF SYSTEMS:  [X]  denotes positive finding, [ ]  denotes negative finding Cardiac  Comments:  Chest pain or chest pressure:    Shortness of breath upon exertion:    Short of breath when lying flat:    Irregular heart rhythm:        Vascular    Pain in calf, thigh, or hip brought on by ambulation:    Pain in feet at night that wakes you up from your sleep:     Blood clot in your veins:    Leg swelling:         Pulmonary    Oxygen at home:    Productive cough:     Wheezing:         Neurologic    Sudden weakness in arms or legs:     Sudden numbness in arms or legs:  Sudden onset of difficulty speaking or slurred speech:    Temporary loss of vision in one eye:     Problems with dizziness:         Gastrointestinal    Blood in stool:     Vomited blood:         Genitourinary    Burning when urinating:     Blood in urine:        Psychiatric    Major depression:         Hematologic    Bleeding problems:    Problems with blood clotting too easily:        Skin    Rashes or ulcers:        Constitutional    Fever or chills:      PHYSICAL EXAM: There were no vitals filed for this visit.   GENERAL: The patient is a well-nourished female, in no acute distress. The vital signs are documented above. CARDIAC: There is a regular rate and rhythm.  VASCULAR:  Left neck incision c/d/i PULMONARY: No respiratory distress. NEUROLOGIC: No  focal weakness or paresthesias are detected.  CN II-XII grossly intact.  DATA:   Carotid duplex today shows widely patent left carotid stent with minimal contralateral disease in the right ICA in the 1-39% range  Assessment/Plan:  83 year-old female that presents for 1 year follow-up for surveillance of her left carotid stent after left TCAR on 07/23/2018 for asymptomatic high-grade stenosis.  Discussed that her left carotid stent is widely patent on duplex today.  She has no significant contralateral disease with 1-39% stenosis.  She is on optimal medical therapy with aspirin  Plavix  statin.  Will arrange follow-up again in 1 year with carotid duplex for ongoing surveillance.  Discussed she call with questions or concerns.  She does have follow-up with Dr. Rolan next month for heart failure clinic     Lonni DOROTHA Gaskins, MD [facility] Office: [phone]   Lonni JINNY Gaskins

## 2024-06-11 ENCOUNTER — Encounter: Payer: Self-pay | Admitting: Vascular Surgery

## 2024-06-11 ENCOUNTER — Ambulatory Visit (HOSPITAL_COMMUNITY)
Admission: RE | Admit: 2024-06-11 | Discharge: 2024-06-11 | Disposition: A | Discharge status: Home / Self Care | Source: Ambulatory Visit | Attending: Vascular Surgery | Admitting: Vascular Surgery

## 2024-06-11 ENCOUNTER — Ambulatory Visit (INDEPENDENT_AMBULATORY_CARE_PROVIDER_SITE_OTHER): Admitting: Vascular Surgery

## 2024-06-11 VITALS — BP 142/67 | HR 70 | Temp 98.1°F | Resp 18 | Ht 63.0 in | Wt 140.7 lb

## 2024-06-11 DIAGNOSIS — I6522 Occlusion and stenosis of left carotid artery: Secondary | ICD-10-CM | POA: Diagnosis not present

## 2024-06-14 ENCOUNTER — Telehealth: Payer: Self-pay | Admitting: Family Medicine

## 2024-06-14 NOTE — Telephone Encounter (Signed)
 Copied from CRM [id number]. Topic: General - Call Back - No Documentation >> Jun 14, 2024 11:39 AM Shereese L wrote: Reason for CRM: patient stated that she's returning office calls and would like a call back.SABRASABRA

## 2024-06-14 NOTE — Telephone Encounter (Signed)
 Called patient let her know that we don't have any open messages at all. She sates that phone does ring. She will call back if any questions.

## 2024-06-16 NOTE — Progress Notes (Unsigned)
 Cardiology Office Note  Date:  06/17/2024   ID:  Huntleigh, Doolen 1940-11-25, MRN [id number]  PCP:  Rilla Baller, MD   Chief Complaint  Patient presents with   12 month follow up     Patient c/o chest pain and shortness of breath.     HPI:  Cassandra Benson is a very pleasant 83 year old woman with  Chronic chest pain 15 years of diabetes, on insulin ,  Hyperlipidemia, prior episodes of chest pain,  80% stenosis of the left internal carotid artery.  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 Mady 12/02/22: nonobstructive disease Cardiac catheterization April 22, 2024, Minimal coronary disease, Who presents for routine follow-up of her chest pain, PAD  Last seen by myself in clinic 6/24 Seen by CHF clinic 9/25  For chronic chest pain Cardiac catheterization April 22, 2024 Minimal coronary disease, nonobstructive Ejection fraction normal 55 to 65%  Echo March 2025 EF 60%  Low stamina Does home exercise Does not feel comfortable driving Takes sister and lady friends  Son lives in town Lost grandson-suicide, lived in [place]   Taking NTG as needed  Labs reviewed A1C 12.2  Total chol 217, LDL 126   recent sugars are low: 110 to 120 On insulin   EKG personally reviewed by myself on todays visit EKG Interpretation Date/Time:  Monday June 17 2024 10:13:12 EDT Ventricular Rate:  60 PR Interval:  168 QRS Duration:  80 QT Interval:  430 QTC Calculation: 430 R Axis:   4  Text Interpretation: Normal sinus rhythm Low voltage QRS When compared with ECG of 30-May-2024 15:01, Premature ventricular complexes are no longer Present Confirmed  by Perla Lye [phone]) on 06/17/2024 10:19:17 AM    Other past medical history reviewed emergency room March 2022 for chest pain, shoulder pain felt to be atypical in nature Atypical pain --CTA chest showed no PE or other acute abnormality.   stressful 2016/07/20 husband died in 07/20/16, cancer Was diagnosed late spring, died several months later from metastatic disease lonely at nighttime  Previous episodes of chest pain,  woke up with some chest discomfort.  Went to the emergency room, cardiac enzymes negative 3,  chest CT with no PE.   She does report prior cardiac catheterization in July 21, 1999 showing no significant disease CT scan done 04/2015 in the emergency room shows no coronary calcifications, minimal minimal descending aorta atherosclerosis    PMH:   has a past medical history of Acute diverticulitis (04/2021), Allergy, ANA positive, Arthritis, Carotid artery occlusion, Carotid stenosis, asymptomatic (06/19/2015), CHF (congestive heart failure) (HCC), Colon polyps, COVID-19 virus infection (09/14/2021), Dermatomyositis (HCC), Diabetes mellitus without complication (HCC), Diverticulosis, Family history of adverse reaction to anesthesia, Fibromyalgia, GERD (gastroesophageal reflux disease), Glaucoma, History of blood clots, History of chicken pox, History of diverticulitis, History of pericarditis 07-20-85), History of pneumonia 2013-07-20), History of shingles, History of UTI, Hyperlipidemia, Hypertension, Hypothyroidism, Mixed connective tissue disease (HCC), Partial small bowel obstruction (HCC) (12/2019), Peptic ulcer, Pneumonia, PONV (postoperative nausea and vomiting), Raynaud's disease without gangrene, Shoulder pain (left), Sigmoid diverticulitis (05/26/2021), Sjogren's syndrome (HCC), Sleep apnea, 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HCC), and Vitamin D  deficiency.  PSH:    Past Surgical History:  Procedure Laterality Date   ABDOMINAL HYSTERECTOMY  1978   fibroids and  menorrhagia, ovaries remain   ARTERY  BIOPSY Right 04/06/2018   Procedure: BIOPSY TEMPORAL ARTERY RIGHT;  Surgeon: Herminio Miu, MD;  Location: [facility];  Service: ENT;  Laterality: Right;  Diabetic - insulin  pump sleep apnea   CARDIAC CATHETERIZATION  2000   [facility] normal per patient   COLONOSCOPY  10/2011   1 TA, 1 HP, very tortuous colon (Lawal)   COLONOSCOPY  02/2020   TA, inflammatory polyp, mod diverticulosis, int/ext hemorrhoids (Pyrtle) no rpt recommended    COLONOSCOPY WITH ESOPHAGOGASTRODUODENOSCOPY (EGD)  03/2007   2 ulcers, benign polyp, rpt 5 yrs [facility] [name])   LEFT HEART CATH AND CORONARY ANGIOGRAPHY Left 04/22/2024   Procedure: LEFT HEART CATH AND CORONARY ANGIOGRAPHY;  Surgeon: Darron Deatrice LABOR, MD;  Location: [facility];  Service: Cardiovascular;  Laterality: Left;   PARTIAL HIP ARTHROPLASTY Right 2013   hip replacement   RIGHT HEART CATH AND CORONARY ANGIOGRAPHY Bilateral 12/02/2022   Procedure: RIGHT HEART CATH AND CORONARY ANGIOGRAPHY;  Surgeon: Mady Bruckner, MD;  Location: [facility];  Service: Cardiovascular;  Laterality: Bilateral;   TONSILLECTOMY     TONSILLECTOMY AND ADENOIDECTOMY     TRANSCAROTID ARTERY REVASCULARIZATION  Left 07/23/2018   Procedure: TRANSCAROTID ARTERY REVASCULARIZATION;  Surgeon: Gretta Bruckner PARAS, MD;  Location: [facility] OR;  Service: Vascular;  Laterality: Left;   TUBAL LIGATION     VAGINAL DELIVERY     x2, no complications    Current Outpatient Medications  Medication Sig Dispense Refill   acetaminophen  (TYLENOL ) 500 MG tablet Take 500 mg by mouth every 6 (six) hours as needed for mild pain (pain score 1-3) or headache.     albuterol  (VENTOLIN  HFA) 108 (90 Base) MCG/ACT inhaler Inhale 2 puffs into the lungs every 6 (six) hours as needed. 8 g 2   Ascorbic Acid  (VITAMIN C ) 100 MG tablet Take 100 mg by mouth daily.     aspirin  EC 81 MG tablet Take 1 tablet (81 mg total) by mouth daily. Swallow whole. 30 tablet 12   Blood  Glucose Monitoring Suppl (PRODIGY AUTOCODE BLOOD GLUCOSE) DEVI 1 Device by Does not apply route daily.     Cholecalciferol  (VITAMIN D3) 25 MCG (1000 UT) CAPS Take 2 capsules (2,000 Units total) by mouth daily.     clopidogrel  (PLAVIX ) 75 MG tablet TAKE 1 TABLET(75 MG) BY MOUTH EVERY EVENING 30 tablet 11   donepezil  (ARICEPT ) 10 MG tablet Take 1 tablet (10 mg total) by mouth at bedtime. 90 tablet 1   Fluticasone-Umeclidin-Vilant (TRELEGY ELLIPTA ) 100-62.5-25 MCG/ACT AEPB Inhale 1 puff into the lungs daily. 28 each 11   furosemide  (LASIX ) 20 MG tablet Take 1 tablet (20 mg total) by mouth daily. 30 tablet 3   Glucagon , rDNA, (GLUCAGON  EMERGENCY) 1 MG KIT INJECT into THE muscle ONCE AS NEEDED FOR emergency 1 kit 12   glucose blood (PRODIGY NO CODING BLOOD GLUC) test strip Use as instructed 100 each 12   insulin  degludec (TRESIBA  FLEXTOUCH) 100 UNIT/ML FlexTouch Pen Inject 60 Units into the skin at bedtime. 18 mL 0   insulin  lispro (HUMALOG ) 100 UNIT/ML injection Inject 0.07 mLs (7 Units total) into the skin 3 (three) times daily before meals. Dose per sliding scale 10 mL 0   isosorbide  mononitrate (IMDUR ) 30 MG 24 hr tablet Take 1 tablet (30 mg total) by mouth daily. 90 tablet 3   Lancets (ONETOUCH DELICA PLUS LANCET33G) MISC Apply topically.     losartan  (COZAAR ) 50 MG tablet  Take 50 mg by mouth daily.     metoprolol  succinate (TOPROL -XL) 25 MG 24 hr tablet Take 25 mg by mouth daily.     Multiple Vitamin (MULTIVITAMIN ADULT) TABS Take 1 tablet by mouth in the morning and at bedtime.     Multiple Vitamins-Minerals (PRESERVISION AREDS PO) Take 1 tablet by mouth in the morning and at bedtime.     nitroGLYCERIN  (NITROSTAT ) 0.4 MG SL tablet Place 1 tablet (0.4 mg total) under the tongue every 5 (five) minutes as needed for chest pain. 25 tablet PRN   rosuvastatin  (CRESTOR ) 20 MG tablet Take 1 tablet (20 mg total) by mouth every evening. 90 tablet 4   SYNTHROID  75 MCG tablet TAKE ONE TABLET BY MOUTH  Monday-Saturday BEFORE breakfast 80 tablet 4   topiramate  (TOPAMAX ) 25 MG tablet Take 1 tablet (25 mg total) by mouth at bedtime. For headache prevention 30 tablet 6   No current facility-administered medications for this visit.    Allergies:   Iodinated contrast media, Penicillins, Amlodipine , Carvedilol , Codeine, Gabapentin , Influenza vaccines, Insulin  aspart (human analog) (yeast), Nortriptyline , Prednisone , Valsartan, Zetia  [ezetimibe ], Anectine [succinylcholine], Erythromycin, and Sulfa antibiotics   Social History:  The patient  reports that she has never smoked. She has never used smokeless tobacco. She reports that she does not drink alcohol and does not use drugs.   Family History:   family history includes Alcohol abuse in her brother; Anuerysm in her sister; Breast cancer in her maternal aunt, maternal aunt, and sister; CAD in her sister; CAD (age of onset: 61) in her brother; CAD (age of onset: 60) in her brother; CAD (age of onset: 77) in her father and mother; COPD in her brother and mother; Depression in her grandchild; Diabetes in her brother, sister, and sister; Gallbladder disease in her maternal grandmother; Yvone' disease in her mother; Hypertension in her maternal grandmother; Lupus in her mother and sister; Rheum arthritis in her mother; Stroke in her brother, maternal grandmother, and sister.    Review of Systems: Review of Systems  Constitutional:  Positive for malaise/fatigue.  HENT: Negative.    Respiratory: Negative.    Cardiovascular: Negative.   Gastrointestinal: Negative.   Musculoskeletal: Negative.   Neurological:  Positive for headaches.  Psychiatric/Behavioral: Negative.    All other systems reviewed and are negative.   PHYSICAL EXAM: VS:  BP (!) 110/58 (BP Location: Left Arm, Patient Position: Sitting, Cuff Size: Normal)   Pulse 60   Ht 5' 3 (1.6 m)   Wt 142 lb 6 oz (64.6 kg)   SpO2 95%   BMI 25.22 kg/m  , BMI Body mass index is 25.22  kg/m. Constitutional:  oriented to person, place, and time. No distress.  HENT:  Head: Grossly normal Eyes:  no discharge. No scleral icterus.  Neck: No JVD, no carotid bruits  Cardiovascular: Regular rate and rhythm, no murmurs appreciated Pulmonary/Chest: Clear to auscultation bilaterally, no wheezes or rails Abdominal: Soft.  no distension.  no tenderness.  Musculoskeletal: Normal range of motion Neurological:  normal muscle tone. Coordination normal. No atrophy Skin: Skin warm and dry Psychiatric: normal affect, pleasant  Recent Labs: 10/09/2023: TSH 1.90 03/08/2024: ALT 15; Magnesium  2.0 04/05/2024: B Natriuretic Peptide 88.3 04/10/2024: BUN 17; Creatinine, Ser 0.91; Hemoglobin 13.0; Platelets 211; Potassium 3.6; Sodium 138    Lipid Panel Lab Results  Component Value Date   CHOL 217 (H) 10/09/2023   HDL 53.50 10/09/2023   LDLCALC 126 (H) 10/09/2023   TRIG 189.0 (H) 10/09/2023  Wt Readings from Last 3 Encounters:  06/17/24 142 lb 6 oz (64.6 kg)  06/11/24 140 lb 11.2 oz (63.8 kg)  05/30/24 143 lb (64.9 kg)     ASSESSMENT AND PLAN:  Essential hypertension -  Blood pressure is well controlled on today's visit. No changes made to the medications.  LADA (latent autoimmune diabetes in adults), managed as type 1 (HCC) Managed by  primary care  bilateral carotid artery stenosis Tolerating Crestor  20 mg daily, stress compliance Cholesterol running higher  Adjustment disorder with mixed anxiety and depressed mood loss of grandson through suicide Memory issues  Chronic stable angina Cardiac CTA completed, no significant disease Nonobstructive disease on cath 3/24 and 2025 No further cardiac cath needed Likely spasm , on imdur   Pure hypercholesterolemia Stressed she take crestor  Could Consider Zetia   Chronic headaches Prior MRI Findings of chronic ischemic microangiopathy and generalized volume loss without a clear lobar predilection.  Leg weakness, loss of  stamina Suggested regular walking program   Orders Placed This Encounter  Procedures   EKG 12-Lead     Signed, Velinda Lunger, M.D., Ph.D. 06/17/2024  [facility] [place], [place] [phone]

## 2024-06-17 ENCOUNTER — Ambulatory Visit: Attending: Cardiovascular Disease | Admitting: Cardiovascular Disease

## 2024-06-17 ENCOUNTER — Encounter: Payer: Self-pay | Admitting: Cardiovascular Disease

## 2024-06-17 VITALS — BP 110/58 | HR 60 | Ht 63.0 in | Wt 142.4 lb

## 2024-06-17 DIAGNOSIS — I6522 Occlusion and stenosis of left carotid artery: Secondary | ICD-10-CM

## 2024-06-17 DIAGNOSIS — I25118 Atherosclerotic heart disease of native coronary artery with other forms of angina pectoris: Secondary | ICD-10-CM

## 2024-06-17 DIAGNOSIS — I5032 Chronic diastolic (congestive) heart failure: Secondary | ICD-10-CM

## 2024-06-17 DIAGNOSIS — I10 Essential (primary) hypertension: Secondary | ICD-10-CM

## 2024-06-17 MED ORDER — POTASSIUM CHLORIDE ER 10 MEQ PO TBCR: 10.0000 meq | EXTENDED_RELEASE_TABLET | Freq: Every day | ORAL | 3 refills | Status: AC

## 2024-06-17 NOTE — Patient Instructions (Addendum)
 Walk daily for leg strength  Medication Instructions:   Please start potassium 10 meq daily Stop aspirin   If you need a refill on your cardiac medications before your next appointment, please call your pharmacy.   Lab work: No new labs needed  Testing/Procedures: No new testing needed  Follow-Up: At [facility], you and your health needs are our priority.  As part of our continuing mission to provide you with exceptional heart care, we have created designated Provider Care Teams.  These Care Teams include your primary Cardiologist (physician) and Advanced Practice Providers (APPs -  Physician Assistants and Nurse Practitioners) who all work together to provide you with the care you need, when you need it.  You will need a follow up appointment in 12 months  Providers on your designated Care Team:   Lonni Meager, NP Bernardino Bring, PA-C Cadence [place], [place]  COVID-19 Vaccine Information can be found at: [email or web address] For questions related to vaccine distribution or appointments, please email [email or web address] or call [phone].

## 2024-06-19 ENCOUNTER — Encounter: Payer: Self-pay | Admitting: Family Medicine

## 2024-06-19 ENCOUNTER — Encounter: Payer: Self-pay | Admitting: *Deleted

## 2024-06-19 ENCOUNTER — Ambulatory Visit (INDEPENDENT_AMBULATORY_CARE_PROVIDER_SITE_OTHER): Admitting: Family Medicine

## 2024-06-19 VITALS — BP 144/78 | HR 72 | Temp 97.6°F | Ht 63.0 in | Wt 141.0 lb

## 2024-06-19 DIAGNOSIS — E041 Nontoxic single thyroid nodule: Secondary | ICD-10-CM

## 2024-06-19 DIAGNOSIS — E13319 Other specified diabetes mellitus with unspecified diabetic retinopathy without macular edema: Secondary | ICD-10-CM | POA: Diagnosis not present

## 2024-06-19 DIAGNOSIS — G3184 Mild cognitive impairment, so stated: Secondary | ICD-10-CM | POA: Diagnosis not present

## 2024-06-19 LAB — POCT GLYCOSYLATED HEMOGLOBIN (HGB A1C): Hemoglobin A1C: 12.2 % — AB (ref 4.0–5.6)

## 2024-06-19 NOTE — Patient Instructions (Addendum)
 I will order thyroid  ultrasound at [facility] outpatient imaging center Call to schedule mammogram at your convenience: [facility] [phone]  Schedule diabetic eye exam as you're due.  Good to see you today  Return as needed or in 1 month for follow up.   I will refer you to our pharmacy team Maryjane) regarding re-trying continuous glucose monitoring for safe insulin  dosing changes.

## 2024-06-19 NOTE — Progress Notes (Unsigned)
 Ph: [phone] Fax: [phone]   Patient ID: Cassandra Benson, female    DOB: 09-19-1941, 83 y.o.   MRN: [id number]  This visit was conducted in person.  BP (!) 144/78   Pulse 72   Temp 97.6 F (36.4 C) (Oral)   Ht 5' 3 (1.6 m)   Wt 141 lb (64 kg)   SpO2 100%   BMI 24.98 kg/m   BP Readings from Last 3 Encounters:  06/19/24 (!) 144/78  06/17/24 (!) 110/58  06/11/24 (!) 142/67    CC: 3 wk f/u visit  Subjective:   HPI: Cassandra Benson is a 83 y.o. female presenting on 06/19/2024 for Medical Management of Chronic Issues (Pt is here for 3 week DM F/U)   See prior notes for details.   Since last seen has seen [facility] Dr Gretta and cardiology Dr Perla, as well as advanced CHF clinic [name] NP - notes reviewed.  Pending appt with Dr Rolan 07/08/2024.   Notes ongoing R headache pain.  LADA - did not bring in any sugar readings today. She states sugars have been much better controlled in the past 5 days - 110-130s, however also mentions she recorded a cbg reading 224 yesterday.   Current diabetic regimen: Tresiba  60u nightly at 10pm (pens)  Lispro (Humalog ) 8-10u before meals (pens)   She remains confused about how she takes her insulin , specifically her mealtime. It's a reasonable amount.  Lab Results  Component Value Date   HGBA1C 12.2 (A) 06/19/2024    Needs to schedule appt with eye doctor.   Over the past 1-2 wks notes R sided thyroid  fullness. No dysphagia, hoarseness.  Lab Results  Component Value Date   TSH 1.90 10/09/2023     Chronic R thoracic back pain.  Thoracic spine MRI IMPRESSION 08/2022: 1. Mild degenerative changes of the thoracic spine, more pronounced at T6-7 where a posterior disc osteophyte complex results in mild narrowing of the thecal sac and mild mass effect on the anterior cord surface without cord signal abnormality.  Cervical spine MRI IMPRESSION 09/2022: 1. Multilevel cervical spondylosis, most advanced at C5-6  where there is mild spinal stenosis and moderate foraminal narrowing bilaterally. 2. Mild spinal stenosis and moderate left foraminal narrowing at C4-5. 3. No acute findings or abnormal cord signal.     Relevant past medical, surgical, family and social history reviewed and updated as indicated. Interim medical history since our last visit reviewed. Allergies and medications reviewed and updated. Outpatient Medications Prior to Visit  Medication Sig Dispense Refill   acetaminophen  (TYLENOL ) 500 MG tablet Take 500 mg by mouth every 6 (six) hours as needed for mild pain (pain score 1-3) or headache.     albuterol  (VENTOLIN  HFA) 108 (90 Base) MCG/ACT inhaler Inhale 2 puffs into the lungs every 6 (six) hours as needed. 8 g 2   Ascorbic Acid  (VITAMIN C ) 100 MG tablet Take 100 mg by mouth daily.     Blood Glucose Monitoring Suppl (PRODIGY AUTOCODE BLOOD GLUCOSE) DEVI 1 Device by Does not apply route daily.     Cholecalciferol  (VITAMIN D3) 25 MCG (1000 UT) CAPS Take 2 capsules (2,000 Units total) by mouth daily.     clopidogrel  (PLAVIX ) 75 MG tablet TAKE 1 TABLET(75 MG) BY MOUTH EVERY EVENING 30 tablet 11   donepezil  (ARICEPT ) 10 MG tablet Take 1 tablet (10 mg total) by mouth at bedtime. 90 tablet 1   Fluticasone-Umeclidin-Vilant (TRELEGY ELLIPTA ) 100-62.5-25 MCG/ACT AEPB Inhale 1 puff into  the lungs daily. 28 each 11   furosemide  (LASIX ) 20 MG tablet Take 1 tablet (20 mg total) by mouth daily. 30 tablet 3   Glucagon , rDNA, (GLUCAGON  EMERGENCY) 1 MG KIT INJECT into THE muscle ONCE AS NEEDED FOR emergency 1 kit 12   glucose blood (PRODIGY NO CODING BLOOD GLUC) test strip Use as instructed 100 each 12   insulin  degludec (TRESIBA  FLEXTOUCH) 100 UNIT/ML FlexTouch Pen Inject 60 Units into the skin at bedtime. 18 mL 0   insulin  lispro (HUMALOG ) 100 UNIT/ML injection Inject 0.07 mLs (7 Units total) into the skin 3 (three) times daily before meals. Dose per sliding scale 10 mL 0   isosorbide  mononitrate  (IMDUR ) 30 MG 24 hr tablet Take 1 tablet (30 mg total) by mouth daily. 90 tablet 3   Lancets (ONETOUCH DELICA PLUS LANCET33G) MISC Apply topically.     losartan  (COZAAR ) 50 MG tablet Take 50 mg by mouth daily.     metoprolol  succinate (TOPROL -XL) 25 MG 24 hr tablet Take 25 mg by mouth daily.     Multiple Vitamin (MULTIVITAMIN ADULT) TABS Take 1 tablet by mouth in the morning and at bedtime.     Multiple Vitamins-Minerals (PRESERVISION AREDS PO) Take 1 tablet by mouth in the morning and at bedtime.     nitroGLYCERIN  (NITROSTAT ) 0.4 MG SL tablet Place 1 tablet (0.4 mg total) under the tongue every 5 (five) minutes as needed for chest pain. 25 tablet PRN   potassium chloride  (KLOR-CON ) 10 MEQ tablet Take 1 tablet (10 mEq total) by mouth daily. 90 tablet 3   rosuvastatin  (CRESTOR ) 20 MG tablet Take 1 tablet (20 mg total) by mouth every evening. 90 tablet 4   SYNTHROID  75 MCG tablet TAKE ONE TABLET BY MOUTH Monday-Saturday BEFORE breakfast 80 tablet 4   topiramate  (TOPAMAX ) 25 MG tablet Take 1 tablet (25 mg total) by mouth at bedtime. For headache prevention 30 tablet 6   No facility-administered medications prior to visit.     Per HPI unless specifically indicated in ROS section below Review of Systems  Objective:  BP (!) 144/78   Pulse 72   Temp 97.6 F (36.4 C) (Oral)   Ht 5' 3 (1.6 m)   Wt 141 lb (64 kg)   SpO2 100%   BMI 24.98 kg/m   Wt Readings from Last 3 Encounters:  06/19/24 141 lb (64 kg)  06/17/24 142 lb 6 oz (64.6 kg)  06/11/24 140 lb 11.2 oz (63.8 kg)      Physical Exam Vitals and nursing note reviewed.  Constitutional:      Appearance: Normal appearance. She is not ill-appearing.  HENT:     Head: Normocephalic and atraumatic.     Mouth/Throat:     Mouth: Mucous membranes are moist.     Pharynx: Oropharynx is clear. No oropharyngeal exudate or posterior oropharyngeal erythema.  Eyes:     Extraocular Movements: Extraocular movements intact.     Conjunctiva/sclera:  Conjunctivae normal.     Pupils: Pupils are equal, round, and reactive to light.  Neck:     Thyroid : Thyroid  mass (right sided nodule possible) present. No thyromegaly or thyroid  tenderness.  Cardiovascular:     Rate and Rhythm: Normal rate and regular rhythm.     Pulses: Normal pulses.     Heart sounds: Normal heart sounds. No murmur heard. Pulmonary:     Effort: Pulmonary effort is normal. No respiratory distress.     Breath sounds: Normal breath sounds. No wheezing, rhonchi  or rales.  Musculoskeletal:        General: Tenderness present.     Cervical back: Normal range of motion and neck supple. No tenderness.     Right lower leg: Edema (tr) present.     Left lower leg: Edema (tr) present.  Skin:    General: Skin is warm and dry.     Findings: No rash.  Neurological:     Mental Status: She is alert.  Psychiatric:        Mood and Affect: Mood normal.        Behavior: Behavior normal.       Results for orders placed or performed in visit on 06/19/24  POCT glycosylated hemoglobin (Hb A1C)   Collection Time: 06/19/24 12:44 PM  Result Value Ref Range   Hemoglobin A1C 12.2 (A) 4.0 - 5.6 %   HbA1c POC (<> result, manual entry)     HbA1c, POC (prediabetic range)     HbA1c, POC (controlled diabetic range)     *Note: Due to a large number of results and/or encounters for the requested time period, some results have not been displayed. A complete set of results can be found in Results Review.    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 We're in a difficult situation as I'm not comfortable titrating insulin  without any sugar readings, and we've had poor success with all previous measures to help her manage diabetes (has resisted measures to have family assist (sister, DIL who is Charity fundraiser), has  refused to return to diabetes educators in the past including our previous pharmacist, has stopped seeing endocrinologists in the past). Cognitive issues complicate picture.  She remains frustrated with her diabetes management, focusing on dietary efforts and not insulin  administration.   I think she would benefit from CGM however she has had trouble managing this in the past.  She is interested in insulin  pump however I have told her she needs to see endo for this.  She agrees to referral to pharmacy team to assist with insulin  management, CGM teaching.  RTC 1 mo DM f/u visit.   Rec schedule diabetic eye exam as due.   Current diabetic regimen: Tresiba  60u nightly at 10pm (pens)  Lispro (Humalog ) 8-10u before meals (pens)       Relevant Orders   POCT glycosylated hemoglobin (Hb A1C) (Completed)   AMB Referral VBCI Care Management   MCI (mild cognitive impairment) with memory loss   Continue aricept  10mg  nightly. Last visit we stopped topamax .       Right thyroid  nodule   Suspect R sided thyroid  nodule.  Update thyroid  US .  TSH normal earlier in the year on Synthroid  75mcg daily.       Relevant Orders   US  THYROID      No orders of the defined types were placed in this encounter.   Orders Placed This Encounter  Procedures   US  THYROID     Standing Status:   Future    Expiration Date:   06/19/2025    Reason for Exam (SYMPTOM  OR DIAGNOSIS REQUIRED):   possible R thyroid  nodule    Preferred imaging location?:   [facility]   AMB Referral VBCI Care Management    Referral Priority:   Routine    Referral Type:   Consultation  Referral Reason:   Care Coordination    Number of Visits Requested:   1   POCT glycosylated hemoglobin (Hb A1C)    Patient Instructions  I will order thyroid  ultrasound at [facility] outpatient imaging center Call to schedule mammogram at your convenience: [facility] [phone]  Schedule diabetic eye exam  as you're due.  Good to see you today  Return as needed or in 1 month for follow up.   I will refer you to our pharmacy team Maryjane) regarding re-trying continuous glucose monitoring for safe insulin  dosing changes.   Follow up plan: Return in about 1 month (around 07/19/2024) for follow up visit.  Anton Blas, MD

## 2024-06-21 DIAGNOSIS — E041 Nontoxic single thyroid nodule: Secondary | ICD-10-CM | POA: Insufficient documentation

## 2024-06-21 NOTE — Assessment & Plan Note (Addendum)
 Continue aricept  10mg  nightly. Last visit we stopped topamax .

## 2024-06-21 NOTE — Assessment & Plan Note (Signed)
 Suspect R sided thyroid  nodule.  Update thyroid  US .  TSH normal earlier in the year on Synthroid  75mcg daily.

## 2024-06-21 NOTE — Assessment & Plan Note (Addendum)
 Chronic, persistently poor control as evidenced by A1c today 12.2%. We're in a difficult situation as I'm not comfortable titrating insulin  without any sugar readings, and we've had poor success with all previous measures to help her manage diabetes (has resisted measures to have family assist (sister, DIL who is Charity fundraiser), has refused to return to diabetes educators in the past including our previous pharmacist, has stopped seeing endocrinologists in the past). Cognitive issues complicate picture.  She remains frustrated with her diabetes management, focusing on dietary efforts and not insulin  administration.   I think she would benefit from CGM however she has had trouble managing this in the past.  She is interested in insulin  pump however I have told her she needs to see endo for this.  She agrees to referral to pharmacy team to assist with insulin  management, CGM teaching.  RTC 1 mo DM f/u visit.   Rec schedule diabetic eye exam as due.   Current diabetic regimen: Tresiba  60u nightly at 10pm (pens)  Lispro (Humalog ) 8-10u before meals (pens)

## 2024-06-25 ENCOUNTER — Encounter: Payer: Self-pay | Admitting: *Deleted

## 2024-06-27 ENCOUNTER — Other Ambulatory Visit (INDEPENDENT_AMBULATORY_CARE_PROVIDER_SITE_OTHER)

## 2024-06-27 DIAGNOSIS — E13319 Other specified diabetes mellitus with unspecified diabetic retinopathy without macular edema: Secondary | ICD-10-CM

## 2024-06-27 NOTE — Progress Notes (Signed)
 06/28/2024 Name: Cassandra Benson MRN: [id number] DOB: November 04, 1940  Subjective  Chief Complaint  Patient presents with   Diabetes    Reason for visit: ?  Cassandra Benson is a 83 y.o. female with a history of diabetes (LADA), who presents today for an initial diabetes pharmacotherapy visit. They were referred by their PCP for management of diabetes.  Pertinent PMH also includes HTN, CAD (TIA), PAD, HFpEF, FLD, OSA, asthma, hypothyroid (autoimmune/Hashimoto), HLD, mild cognitive impairment/memory loss.  Known DM Complications: peripheral neuropathy, nephropathy   Care Team: Primary Care Provider: Rilla Baller, MD Cardiology: Dr. Perla Benson: Initial visit 07/08/24.  Vascular: Dr. Gretta   Date of Last Diabetes Related Visit: with PCP on 06/19/24   Medication Access/Adherence: Prescription drug coverage: Payor: [employer] / Plan: [employer] PPO / Product Type: *No Product type* / .  - Reports that all medications are affordable.  - Current Patient Assistance: None  Since Last visit / History of Present Illness: ?  Patient reports doing well since her last visit.    Feels that her insulin  dosing is very daunting - feels like a full-time job. States she always abides by insulin  instructions, does not self-adjust doses. Reports her A1c is not reflective of her diet, often gets frustrated when she is told her sugars are high because of her diet and resents providers for this. Feels that no matter what she does, her blood sugars will go up regardless. Was told at [facility] Its not anything you are doing wrong, it is your body. It is broken which reassured her (e.g LADA/T1D). Reports she gets combative when people tell her you need to do...SABRA  1 of 5 children (4 are diabetics). Has a lot of family support from them as well as her two children. Son is local, daughter in [place] . Both check on her multiple times daily, as do her neighbors. Her sister is  very concerned with her A1c numbers.  Reported DM Regimen: ?  Tresiba  once daily Humalog  AC  SMBG: Prodigy reader per patient report ?   Hypo/Hyperglycemia: ?  Symptoms of hypoglycemia since last visit:? no  Symptoms of hyperglycemia since last visit:? no  Reported Diet:  Vegetarian. Lots of fruits and vegetables. Eats food that have the right stuff in it but finds sugars go up either way.   Exercise: Not discussed - address at follow up   DM Prevention:  Statin: Taking; high intensity.?  History of chronic kidney disease? yes History of albuminuria? no, last UACR on 01/11/16 = <5 mg/g - DUE ACE/ARB - Taking losartan  50mg ; Urine MA/CR Ratio - <30 mg/g.  Last eye exam: 04/20/23; No retinopathy present - DUE Last foot exam: 05/17/2024 Tobacco Use: Never smoker  Immunizations:? Flu: No Record - DUE; Pneumococcal: PPSV23 (2011, 2014) PCV13 (2015); Shingrix: No Record - DUE; Covid: No record - DUE  Cardiovascular Risk Reduction History of clinical ASCVD? yes History of heart failure? yes History of hyperlipidemia? yes Current BMI: 25 kg/m2 (Ht 63 in, Wt 64 kg) Taking statin? yes; high intensity (rosuvastatin  20mg ) Taking aspirin ? indicated (secondary prevention); Not taking   Taking SGLT-2i? no - LADA Taking GLP- 1 RA? no      _______________________________________________  Objective    Review of Systems:? Limited in the setting of virtual visit  Endocrine:? No polyuria, polyphagia or blurred vision    Physical Examination:  Vitals:  Wt Readings from Last 3 Encounters:  06/19/24 141 lb (64 kg)  06/17/24 142 lb 6 oz (  64.6 kg)  06/11/24 140 lb 11.2 oz (63.8 kg)   BP Readings from Last 3 Encounters:  06/19/24 (!) 144/78  06/17/24 (!) 110/58  06/11/24 (!) 142/67   Pulse Readings from Last 3 Encounters:  06/19/24 72  06/17/24 60  06/11/24 70     Labs:?  Lab Results  Component Value Date   HGBA1C 12.2 (A) 06/19/2024   HGBA1C 11.3 (A) 03/18/2024   HGBA1C 12.2  12/11/2023   GLUCOSE 252 (H) 04/10/2024   MICRALBCREAT <5.0 01/11/2016   MICRALBCREAT 15.0 01/29/2015   CREATININE 0.91 04/10/2024   CREATININE 0.78 04/05/2024   CREATININE 0.83 04/03/2024   GFR 48.64 (L) 03/18/2024   GFR 56.26 (L) 10/31/2023   GFR 55.58 (L) 10/09/2023    Lab Results  Component Value Date   CHOL 217 (H) 10/09/2023   LDLCALC 126 (H) 10/09/2023   LDLCALC 102 (H) 10/28/2022   LDLCALC 94 10/15/2021   LDLDIRECT 113.0 09/09/2020   LDLDIRECT 177.0 08/09/2016   HDL 53.50 10/09/2023   TRIG 189.0 (H) 10/09/2023   TRIG 129.0 10/28/2022   TRIG 118.0 10/15/2021   ALT 15 03/08/2024   ALT 20 10/09/2023   AST 24 03/08/2024   AST 20 10/09/2023      Chemistry      Component Value Date/Time   NA 138 04/10/2024 1706   NA 139 12/27/2021 1024   NA 141 01/11/2016 0000   K 3.6 04/10/2024 1706   K 4.2 01/11/2016 0000   CL 104 04/10/2024 1706   CO2 24 04/10/2024 1706   BUN 17 04/10/2024 1706   BUN 14 12/27/2021 1024   CREATININE 0.91 04/10/2024 1706   CREATININE 0.89 01/11/2016 0000      Component Value Date/Time   CALCIUM  9.5 04/10/2024 1706   ALKPHOS 81 03/08/2024 0810   ALKPHOS 74 01/11/2016 0000   AST 24 03/08/2024 0810   AST 24 01/11/2016 0000   ALT 15 03/08/2024 0810   ALT 21 01/11/2016 0000   BILITOT 0.7 03/08/2024 0810   BILITOT 0.3 01/11/2016 0000       The ASCVD Risk score (Arnett DK, et al., 2019) failed to calculate for the following reasons:   The 2019 ASCVD risk score is only valid for ages 28 to 45  Assessment and Plan:   1. Diabetes, LADA: uncontrolled per last A1c of 12.2% (10/09/23), increased from previous, 11.3%. Goal <7% without hypoglycemia. Does not regularly check BG which has been a large barrier to insulin  titration however, has recently received a logbook from [facility] where she can start to record all her medical monitoring (heart failure, BP, BG, etc.). She is very open to CGM to remove burden of finger sticks. Wants device that is  hands-off (does not want to deal with a lot of technology/settings).  Continue medications today without changes given unclear BG patterns.  First, will work on obtaining CGM to help guide insulin  optimization.  Reviewed s/sx/tx hypoglycemia Future Consideration: GLP1-RA: Not unreasonable though depends on diet/appetite - weight loss not preferred. Limited data, though consistently shows safety in T1D with lower insulin  requirement on avg. Reasonable if difficult-to-control PPBG or regular PP hypoglycemia.     2. HTN: uncontrolled based on last clinic BP of 144/78 mmHg (06/19/24), goal <130/80 mmHg. Monitored by cards. Continue to monitor.  Current Regimen: metoprolol  succinate 25 mg/d, losartan  50 mg/d, Imdur  30 mg/d Continue medications without changes.    3. ASCVD (secondary prevention): uncontrolled on last lipid panel with LDL 126 mg/dL, TG 810 mg/dL (  10/09/23). LDL goal <55 mg/dL (secondary prevention). Reports intolerances to ezetimibe . Key risk factors include: diabetes, hypertension, hyperlipidemia, sedentary lifestyle, family history of premature CAD, and Prior CVA/TIA Current Regimen: rosuvastatin  20 mg daily Continue medications today without changes.  Future Consideration: Rosuvastatin  40 mg/d  4. Healthcare Maintenance:  Pneumococcal - Current status: Up to date. PCV20 risk/benefit discussion reasonable.   Shingles - Current status: No record, DUE Influenza - Current status: No record - DUE  Due to receive the following vaccines: Tdap, Influenza, Shingrix, and Covid Booster   Follow Up Follow up with clinical pharmacist via office visit for CGM training in ~1-2 weeks Patient given direct line for questions regarding medication therapy  Future Appointments  Date Time Provider Department Center  06/28/2024  1:45 PM [facility]  07/08/2024  9:45 AM Rolan Ezra RAMAN, MD [facility] None  07/11/2024 11:00 AM Tamea Dedra CROME, MD [facility] [address]   07/22/2024 12:30 PM Cassandra Baller, MD [facility] [address]  09/05/2024  1:30 PM Donette Ellouise LABOR, FNP [facility] None  11/07/2024 11:30 AM [facility] ANNUAL WELLNESS VISIT 1 [facility] [address]    Cassandra Benson, PharmD Clinical Pharmacist [facility] [phone]

## 2024-06-28 ENCOUNTER — Ambulatory Visit
Admission: RE | Admit: 2024-06-28 | Discharge: 2024-06-28 | Disposition: A | Discharge status: Home / Self Care | Source: Ambulatory Visit | Attending: Family Medicine | Admitting: Family Medicine

## 2024-06-28 ENCOUNTER — Ambulatory Visit: Payer: Self-pay | Admitting: Family Medicine

## 2024-06-28 DIAGNOSIS — R221 Localized swelling, mass and lump, neck: Secondary | ICD-10-CM | POA: Diagnosis not present

## 2024-06-28 DIAGNOSIS — E041 Nontoxic single thyroid nodule: Secondary | ICD-10-CM | POA: Insufficient documentation

## 2024-06-30 MED ORDER — FREESTYLE LIBRE 3 READER DEVI: 0 refills | Status: AC

## 2024-06-30 MED ORDER — FREESTYLE LIBRE 3 PLUS SENSOR MISC: 3 refills | Status: AC

## 2024-06-30 NOTE — Progress Notes (Signed)
Agree - orders signed

## 2024-07-03 ENCOUNTER — Telehealth: Payer: Self-pay | Admitting: Pharmacist

## 2024-07-03 ENCOUNTER — Encounter: Payer: Self-pay | Admitting: Pharmacist

## 2024-07-03 NOTE — Progress Notes (Signed)
 Brief Telephone Documentation Reason for Call: Follow up on diabetes testing supplies  Summary of Call: [facility] to confirm receipt/coverage of [facility] sensors/reader. Pharmacy confirms both were covered under patient's insurance for $0. Patient has not picked them up yet.   Called patient and left voicemail with direct callback number, [phone]. Will schedule office visit for [facility] teaching/setup as previously discussed with patient.

## 2024-07-03 NOTE — Telephone Encounter (Signed)
 Brief Telephone Documentation Reason for Call: Patient returning call  Summary of Call: Reviewed with patient that the new Herlene is ready to pick up at the pharmacy.  Follow Up: Patient given direct line for further questions/concerns. Scheduled for office visit next Wed for CGM setup/training visit  Manuelita FABIENE Kobs, PharmD Clinical Pharmacist [facility] [phone]

## 2024-07-08 ENCOUNTER — Encounter

## 2024-07-10 ENCOUNTER — Ambulatory Visit: Admitting: Pharmacist

## 2024-07-10 DIAGNOSIS — E13319 Other specified diabetes mellitus with unspecified diabetic retinopathy without macular edema: Secondary | ICD-10-CM

## 2024-07-10 NOTE — Patient Instructions (Addendum)
 Sensor Application Apply Sensors only on the back of your upper arm. If placed in other areas, the Sensor may not function properly and could give you inaccurate readings. Avoid areas with scars, moles, stretch marks, or lumps.   Select an area of skin that generally stays flat during your normal daily activities (no bending or folding). To prevent discomfort or skin irritation, you should select a different site other than the one most recently used. Wash application site using a plain soap or clean with an alcohol wipe. This will help remove any oily residue that may prevent the sensor from sticking properly. Allow site to air dry before proceeding. Note: The area MUST be clean and dry, or the Sensor may not stay on for the full wear duration specified by your Sensor insert. 4. Unscrew the cap from the Sensor 5. Place the Sensor Applicator over the prepared site and push down firmly to apply the Sensor to your body. 6. Gently pull the Sensor Applicator away from your body. The Sensor should now be attached to your skin. 7. Make sure the Sensor is secure after application. Put the cap back on the Sensor Applicator. Discard the used Engineer, agricultural in the trash  What If My Sensor Falls Off or What If My Sensor Isn't Working? Call [employer] Customer Care Team at [phone] Available 7 days a week from 8AM-8PM EST, excluding holidays  How to Prevent Your Sensor From Falling Off Early? Make sure you don't have lotion on your arms/abdomen before applying the sensor.  Use alcohol to clean the area of oils. Make sure your skin is clean and dry before applying a new sensor. You can go to your local pharmacy and ask for Tegaderm, or something similar.  It is a clear, waterproof patch that you can lay over the sensor to help it to stay on longer. Or you can order patches from amazon (or elsewhere online). Try searching Libre patch on [employer]. Be sure to call [facility] and ask for replacements for the  sensors that have fallen off.  They will offer to replace at least some of the lost sensors.

## 2024-07-10 NOTE — Progress Notes (Signed)
 07/10/2024 Name: Cassandra Benson MRN: [id number] DOB: 09/12/1941  Subjective  Chief Complaint  Patient presents with   Diabetes    Reason for visit: ?  Cassandra Benson is a 83 y.o. female with a history of diabetes (LADA, treated as Type 1), who presents today for a follow up visit for diabetes management and CGM setup/training.?  Care Team: Primary Care Provider: Rilla Baller, MD  Medication Access/Adherence: Prescription drug coverage: Payor: [employer] / Plan: [employer] PPO / Product Type: *No Product type* / .  - Reports that all medications are affordable.  - Current Patient Assistance: None - Medication Adherence: Patient denies missing doses of their medication.    Since Last visit / History of Present Illness: ?  Today, patient has brought a Libre sensor with them to the visit. Did not bring [facility] reader, though does have a compatible smart phone with her.   SMBG: glucometer FBG today: 207 mg/dL.   Used Libre sensors in the past though grew frustrated with sensors. Cannot recall exact reason. Remains open to re-trying.    _______________________________________________  Objective    Review of Systems:?  GI:? No nausea, vomiting, constipation, diarrhea, abdominal pain, dyspepsia, change in bowel habits  Endocrine:? No polyuria, polyphagia or blurred vision    Physical Examination:  Vitals:  Wt Readings from Last 3 Encounters:  06/19/24 141 lb (64 kg)  06/17/24 142 lb 6 oz (64.6 kg)  06/11/24 140 lb 11.2 oz (63.8 kg)   BP Readings from Last 3 Encounters:  06/19/24 (!) 144/78  06/17/24 (!) 110/58  06/11/24 (!) 142/67   Pulse Readings from Last 3 Encounters:  06/19/24 72  06/17/24 60  06/11/24 70   Labs:?  Lab Results  Component Value Date   HGBA1C 12.2 (A) 06/19/2024   HGBA1C 11.3 (A) 03/18/2024   HGBA1C 12.2 12/11/2023   GLUCOSE 252 (H) 04/10/2024   MICRALBCREAT <5.0 01/11/2016   MICRALBCREAT 15.0 01/29/2015    CREATININE 0.91 04/10/2024   CREATININE 0.78 04/05/2024   CREATININE 0.83 04/03/2024   GFR 48.64 (L) 03/18/2024   GFR 56.26 (L) 10/31/2023   GFR 55.58 (L) 10/09/2023   Lab Results  Component Value Date   CHOL 217 (H) 10/09/2023   LDLCALC 126 (H) 10/09/2023   LDLCALC 102 (H) 10/28/2022   LDLCALC 94 10/15/2021   LDLDIRECT 113.0 09/09/2020   LDLDIRECT 177.0 08/09/2016   HDL 53.50 10/09/2023   TRIG 189.0 (H) 10/09/2023   TRIG 129.0 10/28/2022   TRIG 118.0 10/15/2021   ALT 15 03/08/2024   ALT 20 10/09/2023   AST 24 03/08/2024   AST 20 10/09/2023     Chemistry      Component Value Date/Time   NA 138 04/10/2024 1706   NA 139 12/27/2021 1024   NA 141 01/11/2016 0000   K 3.6 04/10/2024 1706   K 4.2 01/11/2016 0000   CL 104 04/10/2024 1706   CO2 24 04/10/2024 1706   BUN 17 04/10/2024 1706   BUN 14 12/27/2021 1024   CREATININE 0.91 04/10/2024 1706   CREATININE 0.89 01/11/2016 0000      Component Value Date/Time   CALCIUM  9.5 04/10/2024 1706   ALKPHOS 81 03/08/2024 0810   ALKPHOS 74 01/11/2016 0000   AST 24 03/08/2024 0810   AST 24 01/11/2016 0000   ALT 15 03/08/2024 0810   ALT 21 01/11/2016 0000   BILITOT 0.7 03/08/2024 0810   BILITOT 0.3 01/11/2016 0000     The ASCVD Risk score (  Arnett DK, et al., 2019) failed to calculate for the following reasons:   The 2019 ASCVD risk score is only valid for ages 61 to 26  Assessment and Plan:   1. Diabetes, LADA: uncontrolled per last A1c of 12.2%.  Freestyle Libre CGM training provided: System overview of Libre 3 Plus including 15 day sensor wear and appropriate site placements Reviewed trend arrows and treatment decisions, along with noting a 5-10 minute delay in readings and to always confirm low glucose alarms with glucometer if no symptoms.  Reviewed possibility of false compression lows Reviewed troubleshoot guides and to call [employer] Customer Support for technical issues or difficulties. Libre 3 App installed on  [employer]. Previous account was saved to her phone, logged in successfully.   Low alarm set to 70 mg/dL, high alarm on per patient preference. Increased high alarm to ~350 mg/dL given current J8r.    Mobile app linked with [facility]    Follow Up Patient given direct line for questions/concerns with [place] sensor.  PCP follow up as scheduled 10/27.  PharmD follow up to review first ~2 weeks of [place] data.   Future Appointments  Date Time Provider Department Center  07/11/2024 11:00 AM Tamea Dedra CROME, MD [facility] [address]  07/15/2024  9:15 AM Bensimhon, Toribio SAUNDERS, MD [facility] None  07/22/2024 12:30 PM Rilla Baller, MD [facility] [address]  07/24/2024  1:00 PM [facility] PHARMACIST [facility] [address]  09/05/2024  1:30 PM Donette Ellouise LABOR, FNP [facility] None  11/07/2024 11:30 AM [facility] ANNUAL WELLNESS VISIT 1 [facility] [address]   Manuelita FABIENE Kobs, PharmD Clinical Pharmacist [facility] [phone]

## 2024-07-11 ENCOUNTER — Ambulatory Visit: Admitting: Pulmonary Disease

## 2024-07-11 ENCOUNTER — Encounter: Payer: Self-pay | Admitting: Pulmonary Disease

## 2024-07-11 VITALS — BP 136/66 | HR 62 | Temp 98.1°F | Ht 63.0 in | Wt 142.6 lb

## 2024-07-11 DIAGNOSIS — J4530 Mild persistent asthma, uncomplicated: Secondary | ICD-10-CM

## 2024-07-11 DIAGNOSIS — R0602 Shortness of breath: Secondary | ICD-10-CM

## 2024-07-11 DIAGNOSIS — J45909 Unspecified asthma, uncomplicated: Secondary | ICD-10-CM

## 2024-07-11 DIAGNOSIS — I5032 Chronic diastolic (congestive) heart failure: Secondary | ICD-10-CM

## 2024-07-11 DIAGNOSIS — R5381 Other malaise: Secondary | ICD-10-CM

## 2024-07-11 MED ORDER — TRELEGY ELLIPTA 100-62.5-25 MCG/ACT IN AEPB: 1.0000 | INHALATION_SPRAY | Freq: Every day | RESPIRATORY_TRACT | 6 refills | Status: AC

## 2024-07-11 NOTE — Patient Instructions (Addendum)
 VISIT SUMMARY:  During today's visit, we discussed your mild asthma and congestive heart failure, which are causing shortness of breath. You mentioned that Trelegy has been helpful in managing your symptoms, and we have ensured your prescription is transferred to your new pharmacy. We also reviewed your past adverse reaction to the swine flu vaccine and the mental distress it continues to cause you.  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  Additionally, we addressed your type 1 diabetes and right-sided chest pain when taking deep breaths.  YOUR PLAN:  -ASTHMA: Asthma is a condition where your airways narrow and swell, producing extra mucus, which can make breathing difficult. Trelegy has been effective in managing your symptoms. We have sent your Trelegy prescription to your new pharmacy and scheduled pulmonary function tests to monitor your condition. We will follow up in four months to review your progress.  INSTRUCTIONS:  Please ensure your prescriptions are correctly transferred to [facility] pharmacy. Attend the scheduled pulmonary function tests and follow-up appointment in four months.

## 2024-07-11 NOTE — Progress Notes (Signed)
 Subjective:    Patient ID: Cassandra Benson, female    DOB: [birth year], 83 y.o.   MRN: [id number]  Patient Care Team: Rilla Baller, MD as PCP - General (Family Medicine) Perla Evalene PARAS, MD as PCP - Cardiology (Cardiology) Jaye Fallow, MD as Referring Physician (Ophthalmology) Tamea Dedra CROME, MD as Consulting Physician (Pulmonary Disease)  Chief Complaint  Patient presents with   Asthma    Shortness of breath on exertion. Cough and wheezing. Stopped Trelegy about 3 weeks ago.     BACKGROUND/INTERVAL:  HPI Discussed the use of AI scribe software for clinical note transcription with the patient, who gave verbal consent to proceed.  History of Present Illness   Cassandra Benson is an 83 year old female with mild asthma and congestive heart failure who presents with shortness of breath.  She experiences shortness of breath, which she attributes to her mild asthma and congestive heart failure. She finds the symptoms distressing and has been using Trelegy, which she finds helpful, although she is concerned about the amount of medication required. She recently switched her pharmacy to [facility] pharmacy and plans to ensure her prescriptions are correctly transferred there.  She recalls a past adverse reaction to the swine flu vaccine, which resulted in hospitalization for three weeks and an inability to walk for a period of time. 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 She expresses ongoing mental distress related to this past event.  She is categorized as a type 1 diabetic. She denies any history of smoking, despite growing up in a tobacco field.  DATA 12/24/2021 echocardiogram: LVEF 60 to 65%, grade 1 DD, normal right ventricular systolic function.  Normal pulmonary artery systolic pressure.  Mild MR. 04/11/2022 CT chest high-resolution: Mild pulmonary fibrosis pattern with apical to basal gradient, no  significant air trapping on expiratory phase.  Not consistent with UIP.  Findings are very mild. 04/22/2022 PFTs: FEV1 1.91 L or 101% predicted, FVC 2.98 L or 117% predicted, FEV1/FVC 64%.  No bronchodilator response, lung volumes normal.  Mild diffusion capacity impairment.  Consistent with mild obstructive airways disease. 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 08/01/2022 connective tissue disease panel, acetylcholine receptor assay: Negative (notably ANA negative). 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  Patient declined CPAP/oxygen. 12/02/2022 right and left heart cath: 50% stenosis of the ostium of the RCA with pressure dampening.  Improvement with intracoronary nitroglycerin  jesting component of vasospasm.  No angiographically significant coronary artery disease noted in the left coronary artery.  Normal left and right heart filling pressures.  Significant vasospasm noted on the right radial artery. 12/23/2022 echocardiogram: LVEF 60 to 65%, normal LV function, grade 1 DD, no aortic or mitral valve dysfunction. 01/13/2023 chest x-ray PA and lateral: No active pulmonary disease. 01/13/2023 VQ scan: Negative for pulmonary emboli. 01/13/2023 lower extremity Dopplers: No evidence of deep venous thrombosis in either lower extremity. 12/01/2023 echocardiogram: LVEF 60 to 65% LV with normal function.  Grade 1 DD.  Right side normal, normal pulmonary artery systolic pressure.  No evidence of valvular abnormality.     Review of Systems A 10 point review of systems was performed and it is as noted above otherwise negative.   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   Chronic diastolic heart failure (HCC) 12/02/2022   Allergy to beta blocker 08/06/2022   Steroid-induced hyperglycemia 05/16/2022   Insomnia 02/01/2022   Osteopenia 01/15/2022   Chronic dyspnea 12/22/2021   Unsteadiness 02/13/2021   Atherosclerosis of aorta 12/16/2020   Grade II diastolic dysfunction 08/01/2020   Chronic venous insufficiency 04/07/2020   Partial small bowel obstruction (HCC) 01/15/2020   Epistaxis 01/13/2020   Renal insufficiency 09/14/2019   Chronic midline thoracic back pain 05/14/2019   Sjogren's syndrome without extraglandular involvement 02/26/2019   Epigastric discomfort 12/17/2018   PAD (peripheral artery disease) 10/04/2018   Raynaud disease 09/04/2018   Amaurosis fugax, both eyes 06/18/2018   TIA (transient ischemic attack) 05/21/2018   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   Facial paresthesia 02/07/2018   Pain and swelling of lower leg 12/25/2017   Fibromyalgia    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), bilateral 08/21/2017   Chronic right-sided headache 03/13/2017   Numbness and tingling of both lower extremities 03/13/2017   6th nerve palsy, 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   Fatty liver 02/13/2017   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) 10/03/2016   Medicare annual wellness visit, subsequent 06/19/2015   Encounter for general adult medical examination with abnormal findings 06/19/2015   Advanced care planning/counseling discussion 06/19/2015   Carotid stenosis, 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 06/19/2015   Vitamin D  deficiency    Family history of premature CAD 06/02/2015   Chronic chest pain 05/26/2015   Elevated testosterone  level in female 09/04/2014   Hyperlipidemia associated with type 2 diabetes mellitus (HCC) 07/30/2014    Essential hypertension 07/17/2014   Hypothyroidism due to Hashimoto's thyroiditis 07/17/2014   OSA (obstructive sleep apnea) 08/22/2012   Chronic low back pain 02/27/2012   Positive ANA (antinuclear antibody) 01/06/2012    Social History   Tobacco Use   Smoking status: Never   Smokeless tobacco: Never  Substance Use Topics   Alcohol use: No    Allergies  Allergen Reactions   Iodinated Contrast Media Other (See Comments)    Itching (severe) and chest tightness, Rash   Penicillins Anaphylaxis, Swelling and Rash        Amlodipine  Swelling    Pedal edema   Carvedilol  Dermatitis    Facial acne   Codeine Nausea Only   Gabapentin  Other (See Comments)    Gait abnormality   Influenza Vaccines Other (See Comments)    Muscle weakness; unable to walk   Insulin  Aspart (Human Analog) (Yeast) Other (See Comments)    headache   Nortriptyline  Other (See Comments)    Eye swelling and facial paralysis   Prednisone      Marked severe hyperglycemia to oral and IM steroids   Valsartan Other (See Comments) and Cough    Allergy to generic only, Hacking cough   Zetia  [Ezetimibe ] Other (See Comments)    Bad muscle cramps   Anectine [Succinylcholine] Rash    Brother went into cardiac arrest.   Erythromycin Rash and Swelling   Sulfa Antibiotics Rash    Current Meds  Medication Sig   acetaminophen  (TYLENOL ) 500 MG tablet Take 500 mg by mouth every 6 (six) hours as needed for mild pain (pain score 1-3) or headache.   albuterol  (VENTOLIN  HFA) 108 (90 Base)  MCG/ACT inhaler Inhale 2 puffs into the lungs every 6 (six) hours as needed.   Ascorbic Acid  (VITAMIN C ) 100 MG tablet Take 100 mg by mouth daily.   Blood Glucose Monitoring Suppl (PRODIGY AUTOCODE BLOOD GLUCOSE) DEVI 1 Device by Does not apply route daily.   Cholecalciferol  (VITAMIN D3) 25 MCG (1000 UT) CAPS Take 2 capsules (2,000 Units total) by mouth daily.   clopidogrel  (PLAVIX ) 75 MG tablet TAKE 1 TABLET(75 MG) BY MOUTH EVERY EVENING    Continuous Glucose Receiver (FREESTYLE LIBRE 3 READER) DEVI Use to check blood sugar continuously. Z79.4. E13.65   Continuous Glucose Sensor (FREESTYLE LIBRE 3 PLUS SENSOR) MISC Change sensor every 15 days. E13.65, Z79.4   donepezil  (ARICEPT ) 10 MG tablet Take 1 tablet (10 mg total) by mouth at bedtime.   furosemide  (LASIX ) 20 MG tablet Take 1 tablet (20 mg total) by mouth daily.   Glucagon , rDNA, (GLUCAGON  EMERGENCY) 1 MG KIT INJECT into THE muscle ONCE AS NEEDED FOR emergency   glucose blood (PRODIGY NO CODING BLOOD GLUC) test strip Use as instructed   insulin  degludec (TRESIBA  FLEXTOUCH) 100 UNIT/ML FlexTouch Pen Inject 60 Units into the skin at bedtime.   insulin  lispro (HUMALOG ) 100 UNIT/ML injection Inject 0.07 mLs (7 Units total) into the skin 3 (three) times daily before meals. Dose per sliding scale   isosorbide  mononitrate (IMDUR ) 30 MG 24 hr tablet Take 1 tablet (30 mg total) by mouth daily.   Lancets (ONETOUCH DELICA PLUS LANCET33G) MISC Apply topically.   losartan  (COZAAR ) 50 MG tablet Take 50 mg by mouth daily.   metoprolol  succinate (TOPROL -XL) 25 MG 24 hr tablet Take 25 mg by mouth daily.   Multiple Vitamin (MULTIVITAMIN ADULT) TABS Take 1 tablet by mouth in the morning and at bedtime.   Multiple Vitamins-Minerals (PRESERVISION AREDS PO) Take 1 tablet by mouth in the morning and at bedtime.   nitroGLYCERIN  (NITROSTAT ) 0.4 MG SL tablet Place 1 tablet (0.4 mg total) under the tongue every 5 (five) minutes as needed for chest pain.   potassium chloride  (KLOR-CON ) 10 MEQ tablet Take 1 tablet (10 mEq total) by mouth daily.   rosuvastatin  (CRESTOR ) 20 MG tablet Take 1 tablet (20 mg total) by mouth every evening.   SYNTHROID  75 MCG tablet TAKE ONE TABLET BY MOUTH Monday-Saturday BEFORE breakfast   [DISCONTINUED] Fluticasone-Umeclidin-Vilant (TRELEGY ELLIPTA ) 100-62.5-25 MCG/ACT AEPB Inhale 1 puff into the lungs daily.    Immunization History  Administered Date(s) Administered    Pneumococcal Conjugate-13 09/23/2014   Pneumococcal Polysaccharide-23 01/05/2010, 07/17/2013   Tdap 10/07/2011        Objective:     BP 136/66   Pulse 62   Temp 98.1 F (36.7 C) (Temporal)   Ht 5' 3 (1.6 m)   Wt 142 lb 9.6 oz (64.7 kg)   SpO2 98%   BMI 25.26 kg/m   SpO2: 98 %  GENERAL: Well-developed, well-nourished woman, no acute distress.  Fully ambulatory with no conversational dyspnea.  Nasal quality to speech.  No dysphonia noted today. HEAD: Normocephalic, atraumatic.  EYES: Pupils equal, round, reactive to light.  No scleral icterus.  MOUTH: Oral mucosa moist.  No thrush. NECK: Supple. No thyromegaly. Trachea midline. No JVD.  No adenopathy. PULMONARY: Good air entry bilaterally.  No adventitious sounds. CARDIOVASCULAR: S1 and S2. Regular rate and rhythm.  No rubs, murmurs or gallops heard. ABDOMEN: Significant truncal obesity, otherwise benign. MUSCULOSKELETAL: No joint deformity, no clubbing, no edema of the lower extremities.  NEUROLOGIC: No overt focal  deficit, no gait disturbance, speech is fluent. SKIN: Intact,warm,dry.  No overt rashes, exam limited. PSYCH: Mood is depressed, behavior normal.        Assessment & Plan:     ICD-10-CM   1. Shortness of breath  R06.02 Pulmonary function test   Symptoms out of proportion to her mild persistent asthma    2. Mild persistent asthma without complication  J45.30     3. Chronic diastolic heart failure (HCC)  P49.67     4. Physical deconditioning  R53.81       Orders Placed This Encounter  Procedures   Pulmonary function test    Standing Status:   Future    Expiration Date:   07/11/2025    Where should this test be performed?:   Outpatient Pulmonary    What type of PFT is being ordered?:   Full PFT    MIP/MEP:   Yes    Meds ordered this encounter  Medications   Fluticasone-Umeclidin-Vilant (TRELEGY ELLIPTA ) 100-62.5-25 MCG/ACT AEPB    Sig: Inhale 1 puff into the lungs daily.    Dispense:  60 each     Refill:  6   Discussion:    Asthma, mild Mild asthma with shortness of breath. Trelegy has been effective in managing symptoms. No recent pulmonary function tests; due for repeat testing. No smoking history, but grew [place] in a tobacco field. 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 - Sent Trelegy prescription to [facility] pharmacy. - Scheduled pulmonary function tests. - Scheduled follow-up appointment in four months.  - Her perceived shortness of breath is out of proportion to her mild asthma.  Deconditioning She may benefit from increase activities as allowed by cardiac issues.     Will see the patient in follow-up in 4 months time she is to contact us  prior to that time should any new difficulties arise.  Advised if symptoms do not improve or worsen, to please contact office for sooner follow up or seek emergency care.    I spent 30 minutes of dedicated to the care of this patient on the date of this encounter to include pre-visit review of records, face-to-face time with the patient discussing conditions above, post visit ordering of testing, clinical documentation with the electronic health record, making appropriate referrals as documented, and communicating necessary findings to members of the patients care team.     C. Leita Sanders, MD Advanced Bronchoscopy PCCM [facility]    *This note was generated using voice recognition software/Dragon and/or AI transcription program.  Despite best efforts to proofread, errors can occur which can change the meaning. Any transcriptional errors that result from this process are unintentional and may not be fully corrected at the time of dictation.

## 2024-07-15 ENCOUNTER — Encounter: Admitting: Internal Medicine

## 2024-07-16 ENCOUNTER — Telehealth: Payer: Self-pay

## 2024-07-16 NOTE — Progress Notes (Signed)
 Complex Care Management Note  Care Guide Note 07/16/2024 Name: Cassandra Benson MRN: [id number] DOB: 10-17-1940  Cassandra Benson is a 83 y.o. year old female who sees Rilla Baller, MD for primary care. I reached out to [name] by phone today to offer complex care management services.  Cassandra Benson was given information about Complex Care Management services today including:   The Complex Care Management services include support from the care team which includes your Nurse Care Manager, Clinical Social Worker, or Pharmacist.  The Complex Care Management team is here to help remove barriers to the health concerns and goals most important to you. Complex Care Management services are voluntary, and the patient may decline or stop services at any time by request to their care team member.   Complex Care Management Consent Status: Patient did not agree to participate in complex care management services at this time.  Follow up plan:  Patient wishes to follow up with PCP.   Encounter Outcome:  Patient Refused  Dreama Agent [facility], [facility] VBCI Assistant Direct Dial: [phone]  Fax: [phone]

## 2024-07-17 ENCOUNTER — Telehealth: Payer: Self-pay | Admitting: Family

## 2024-07-17 NOTE — Telephone Encounter (Signed)
 Called to confirm/remind patient of their appointment at the [facility] on 07/18/24.   Appointment:   [] Confirmed  [x] Left mess   [] No answer/No voice mail  [] VM Full/unable to leave message  [] Phone not in service  Patient reminded to bring all medications and/or complete list.  Confirmed patient has transportation. Gave directions, instructed to utilize valet parking.

## 2024-07-17 NOTE — Progress Notes (Deleted)
 Advanced [facility] Failure Clinic Note    PCP: Cassandra Baller, MD  Primary Cardiologist: Cassandra Lye, MD  HF provider: Gardenia Led, MD   Chief Complaint: shortness of breath   HPI:  Cassandra Benson is a 83 yo female with a PMHx significant for HTN, HLD, hypothyroidism, asthma, CAD, LADA, HA's, fibromyalgia, PAD, vasospasm, Sjogren's syndrome, and CHF.   Echo 07/30/20: EF of 55-60% with grade II diastolic dysfunction. Echo 12/24/21: EF of 60-65% along with mild MR.  Was in the ED 11/16/22 due to SOB due to HF exacerbation. Was in the ED 01/13/23 due to SOB/ pedal edema. Elevated D-dimer. VQ scan negative for PE.   RHC 12/02/22: No critical coronary artery disease. The is up to ~50% stenosis at the ostium of the RCA with pressure dampening noted using 54F diagnostic catheter.  Slight improvement noted with intracoronary nitroglycerin  suggesting at least some component of vasospasm.  No angiographically significant coronary artery disease noted in the left coronary artery. Normal left and right heart filling pressures. Normal Fick cardiac output/index. Small right radial artery with significant vasospasm throughout the procedure.  Recommend alternate access for future catheterizations. Recommendations: Add isosorbide  mononitrate 15 mg daily and as needed sublingual nitroglycerin  for antianginal therapy, including possible coronary vasospasm.  Echo 12/23/22: EF 60-65% with Grade I DD.   Was in the ED 03/14/23 due to chest pain that woke her from sleep associated with chest pressure and heaviness in both sides of her chest. EKG, CXR and labs were unremarkable and she was released with improvement of pain.   Was in the ED 07/20/23 due to shortness of breath & chest pain. EKG normal. CXR nl. D dimer elevated but VQ scan negative for PE.   Admitted 11/30/23 with near syncope while walking. Received IV fluid bolus, held antihypertensives and insulin  with improvement in blood pressure. Nothing  observed on telemetry. MRI showing no acute findings. Chest x-ray noted no consolidations. Upon discharge decrease dose of losartan  and stop beta-blocker. Will also decrease nightly insulin  from 60 units down to 30 units. Discharged the next day.   Echo 12/01/23: EF 60-65%, G1DD, normal RV, normal PA pressure  Admitted 03/08/24 with chest pain. On the night before admission patient developed nausea, vomiting and diarrhea lasted the whole night, resolved next morning. Later in the morning she developed chest pain. No associated symptoms. On presentation vital stable, labs with elevated D-dimer at 0.83. Mild leukocytosis at 11.7 and mild hypokalemia. Chest x-ray negative for acute abnormalities. Troponin remained negative x 2 and EKG with no acute ST changes. Pain seems reproducible likely costochondritis. CTA 03/09/24 was negative for any PE or other acute abnormality. Potassium corrected.   Was in the ED 04/03/24 with glucose 300-400 range. IVF and SQ insulin  given with improvement of glucose. No UTI, DKA. CXR negative.   Was in the ED 04/05/24 with chest pain and shortness of breath. Continues with elevated glucose. No DKA. Cardiology consulted.   LHC 04/22/24: EF 55-65%. Mild nonobstructive coronary artery disease. The ostial RCA stenosis appears to be less than seen last year. Suspect there was catheter induced spasm. Normal LV systolic function. Moderately elevated left ventricular end-diastolic pressure at 24 mmHg.   She presents today for a HF f/u visit with a chief complaint of shortness of breath. Has associated fatigue, intermittent chest pain, palpitations with exertion (sweeping the floor), dizziness, occasional pedal edema. She says that this morning she had an episode of chest pain that started in her left upper anterior chest wall  and then went across her chest to her right shoulder. Resolved after a few minutes. Had a repeat cath back in July which showed mild nonobstructive CAD. Has been taking  isosorbide  daily and has SL NTG for PRN use. Has been to ED many times with this same pain and work up has been negative/ unrevealing.   ROS: All systems negative except as listed in HPI, PMH and Problem List.  SH:  Social History   Socioeconomic History   Marital status: Widowed    Spouse name: Not on file   Number of children: 2   Years of education: Not on file   Highest education level: 12th grade  Occupational History   Occupation: retired  Tobacco Use   Smoking status: Never   Smokeless tobacco: Never  Vaping Use   Vaping status: Never Used  Substance and Sexual Activity   Alcohol use: No   Drug use: No   Sexual activity: Not on file  Other Topics Concern   Not on file  Social History Narrative   Lives in [place], moved from [place].    Widow - husband decreased 01/2016 of metastatic colon CA   No pets.   Son Cassandra Benson lives nearby. Daughter lives in [place] . Sister lives 2 blocks away.    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, [employer]   Exercise - limited   Diet - good water, fruits/vegetables daily, limited meat, protein drink every morning   Social Drivers of Corporate investment banker Strain: Medium Risk (03/26/2024)   Overall Financial Resource Strain (CARDIA)    Difficulty of Paying Living Expenses: Somewhat hard  Food Insecurity: No Food Insecurity (03/27/2024)   Hunger Vital Sign    Worried About Running Out of Food in the Last Year: Never true    Ran Out of Food in the Last Year: Never true  Transportation Needs: No Transportation Needs (03/27/2024)   PRAPARE - Administrator, Civil Service (Medical): No    Lack of Transportation (Non-Medical): No  Physical Activity: Insufficiently Active (10/31/2023)   Exercise Vital Sign    Days of Exercise per Week: 3 days    Minutes of Exercise per Session: 20 min  Stress: No Stress Concern Present (10/31/2023)    [employer] of Occupational Health - Occupational Stress Questionnaire    Feeling of Stress : Only a little  Social Connections: Moderately Integrated (03/08/2024)   Social Connection and Isolation Panel    Frequency of Communication with Friends and Family: Three times a week    Frequency of Social Gatherings with Friends and Family: Three times a week    Attends Religious Services: More than 4 times per year    Active Member of Clubs or Organizations: Yes    Attends Banker Meetings: More than 4 times per year    Marital Status: Widowed  Intimate Partner Violence: Not At Risk (03/27/2024)   Humiliation, Afraid, Rape, and Kick questionnaire    Fear of Current or Ex-Partner: No    Emotionally Abused: No    Physically Abused: No    Sexually Abused: No    FH:  Family History  Problem Relation Age of Onset   CAD Mother 20       MI, aortic valve issues   COPD Mother    Lupus Mother    Yvone' disease Mother    Rheum arthritis  Mother    CAD Father 18       CABG x2, aortic valve replacement   Stroke Sister    CAD Sister    Anuerysm Sister        brain   Lupus Sister    Diabetes Sister    Diabetes Sister    Breast cancer Sister    Alcohol abuse Brother    CAD Brother 12       MI   Stroke Brother    COPD Brother        agent orange   CAD Brother 60       stent   Diabetes Brother    Stroke Maternal Grandmother    Hypertension Maternal Grandmother    Gallbladder disease Maternal Grandmother    Breast cancer Maternal Aunt    Breast cancer Maternal Aunt    Depression Grandchild    Colon cancer Neg Hx    Esophageal cancer Neg Hx    Rectal cancer Neg Hx    Stomach cancer Neg Hx     Past Medical History:  Diagnosis Date   Acute diverticulitis 04/2021   [facility], CT confirmed   Allergy    ANA positive    positive ANA pattern 1 speckled   Arthritis    Carotid artery occlusion    Carotid stenosis, asymptomatic 06/19/2015   1-39% RICA 40-59% LICA rpt 1  yr (05/2015)    CHF (congestive heart failure) (HCC)    Colon polyps    COVID-19 virus infection 09/14/2021   Dermatomyositis (HCC)    Diabetes mellitus without complication (HCC)    Type 1   Diverticulosis    sigmoid on CT scan 12/2019   Family history of adverse reaction to anesthesia    brothr went into cardiac arrest from anectine   Fibromyalgia    prior PCP   GERD (gastroesophageal reflux disease)    prior PCP   Glaucoma    Narrow angle   History of blood clots    DVT, in 20s, none since   History of chicken pox    History of diverticulitis    History of pericarditis 1986   with hospitalization   History of pneumonia 2014   History of shingles    History of UTI    Hyperlipidemia    Hypertension    Hypothyroidism    Mixed connective tissue disease    Partial small bowel obstruction (HCC) 12/2019   managed conservatively   Peptic ulcer    Pneumonia    PONV (postoperative nausea and vomiting)    Raynaud's disease without gangrene    Shoulder pain left   h/o RTC tendonitis and adhesive capsulitis   Sigmoid diverticulitis 05/26/2021   Sjogren's syndrome    Sleep apnea    prior PCP - no CPAP for about 10 yrs   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HCC)    Vitamin D  deficiency    prior PCP    Current Outpatient Medications  Medication Sig Dispense Refill   acetaminophen  (TYLENOL ) 500 MG tablet Take 500 mg by mouth every 6 (six) hours as needed for mild pain (pain score 1-3) or headache.     albuterol  (VENTOLIN  HFA) 108 (90 Base) MCG/ACT inhaler Inhale 2 puffs into the lungs every 6 (six) hours as needed. 8 g 2   Ascorbic Acid  (VITAMIN C ) 100 MG tablet Take 100 mg by mouth daily.     Blood Glucose Monitoring Suppl (PRODIGY AUTOCODE BLOOD GLUCOSE) DEVI 1 Device by Does not apply  route daily.     Cholecalciferol  (VITAMIN D3) 25 MCG (1000 UT) CAPS Take 2 capsules (2,000 Units total) by mouth daily.     clopidogrel  (PLAVIX ) 75 MG tablet TAKE 1 TABLET(75 MG) BY MOUTH EVERY EVENING 30 tablet  11   Continuous Glucose Receiver (FREESTYLE LIBRE 3 READER) DEVI Use to check blood sugar continuously. Z79.4. E13.65 1 each 0   Continuous Glucose Sensor (FREESTYLE LIBRE 3 PLUS SENSOR) MISC Change sensor every 15 days. E13.65, Z79.4 6 each 3   donepezil  (ARICEPT ) 10 MG tablet Take 1 tablet (10 mg total) by mouth at bedtime. 90 tablet 1   Fluticasone-Umeclidin-Vilant (TRELEGY ELLIPTA ) 100-62.5-25 MCG/ACT AEPB Inhale 1 puff into the lungs daily. 60 each 6   furosemide  (LASIX ) 20 MG tablet Take 1 tablet (20 mg total) by mouth daily. 30 tablet 3   Glucagon , rDNA, (GLUCAGON  EMERGENCY) 1 MG KIT INJECT into THE muscle ONCE AS NEEDED FOR emergency 1 kit 12   glucose blood (PRODIGY NO CODING BLOOD GLUC) test strip Use as instructed 100 each 12   insulin  degludec (TRESIBA  FLEXTOUCH) 100 UNIT/ML FlexTouch Pen Inject 60 Units into the skin at bedtime. 18 mL 0   insulin  lispro (HUMALOG ) 100 UNIT/ML injection Inject 0.07 mLs (7 Units total) into the skin 3 (three) times daily before meals. Dose per sliding scale 10 mL 0   isosorbide  mononitrate (IMDUR ) 30 MG 24 hr tablet Take 1 tablet (30 mg total) by mouth daily. 90 tablet 3   Lancets (ONETOUCH DELICA PLUS LANCET33G) MISC Apply topically.     losartan  (COZAAR ) 50 MG tablet Take 50 mg by mouth daily.     metoprolol  succinate (TOPROL -XL) 25 MG 24 hr tablet Take 25 mg by mouth daily.     Multiple Vitamin (MULTIVITAMIN ADULT) TABS Take 1 tablet by mouth in the morning and at bedtime.     Multiple Vitamins-Minerals (PRESERVISION AREDS PO) Take 1 tablet by mouth in the morning and at bedtime.     nitroGLYCERIN  (NITROSTAT ) 0.4 MG SL tablet Place 1 tablet (0.4 mg total) under the tongue every 5 (five) minutes as needed for chest pain. 25 tablet PRN   potassium chloride  (KLOR-CON ) 10 MEQ tablet Take 1 tablet (10 mEq total) by mouth daily. 90 tablet 3   rosuvastatin  (CRESTOR ) 20 MG tablet Take 1 tablet (20 mg total) by mouth every evening. 90 tablet 4   SYNTHROID  75  MCG tablet TAKE ONE TABLET BY MOUTH Monday-Saturday BEFORE breakfast 80 tablet 4   No current facility-administered medications for this visit.   There were no vitals filed for this visit.  Wt Readings from Last 3 Encounters:  07/11/24 142 lb 9.6 oz (64.7 kg)  06/19/24 141 lb (64 kg)  06/17/24 142 lb 6 oz (64.6 kg)   Lab Results  Component Value Date   CREATININE 0.91 04/10/2024   CREATININE 0.78 04/05/2024   CREATININE 0.83 04/03/2024    PHYSICAL EXAM:  General: Well appearing.  Cor: No JVD. Regular rhythm, rate.  Lungs: clear Abdomen: soft, nontender, nondistended. Extremities: 1+ pitting edema around bilateral ankles Neuro:. Affect pleasant  ECG: NSR with PVC's, HR 72 (personally reviewed)    ASSESSMENT & PLAN:  NICM with preserved EF- - likely d/t HTN as cath showed no critical CAD - NYHA III - minimally fluid up with pedal edema - weight down 6 pounds from last visit here 6 months ago - Echo 07/30/20: EF of 55-60% with grade II diastolic dysfunction.  - Echo 6/68/76: EF of 60-65%  along with mild MR.  - Echo 12/23/22: EF 60-65% with Grade I DD.  - Echo 12/01/23: EF 60-65%, G1DD, normal RV, normal PA pressure - adhering to low sodium diet and fluid restriction - continue furosemide  20mg  daily although can take an extra 20mg  dose X 2 days - continue losartan  50mg  daily - continue metoprolol  succinate 25mg  daily  - valsartan caused hacking cough - can not add SGLT2 due to LADA - saw HF provider Marcelina) 03/24 - encouraged to wear compression socks daily and elevate legs when sitting for long periods  - BNP 04/05/24 was 88.3. BNP today  2. HTN - BP 135/63 - saw PCP Achilles) 09/25 - BMP 04/10/24 reviewed: sodium 138, potassium 3.6, creatinine 0.91 & GFR >60  - BMET today  3: CAD- - EKG today is NSR with PVC's (personally reviewed) - no current chest pain and she says that her pain this morning was the same pain she has been having. Discussed going to the ER  for further workup but she defers. Advised that if pain changes, becomes more frequent or she has other symptoms at the time of the pain, to call 911. Patient verbalizes that she will do that.  - RHC 12/02/22:   No critical coronary artery disease. The is up to ~50% stenosis at the ostium of the RCA with pressure dampening noted using 9F diagnostic catheter. Slight improvement noted with intracoronary nitroglycerin  suggesting at least some component of vasospasm. No angiographically significant coronary artery disease noted in the left coronary artery. Normal left and right heart filling pressures. Normal Fick cardiac output/index. Small right radial artery with significant vasospasm throughout the procedure.  - saw cardiology Dene) 08/25; returns in 2 more weeks - LHC 04/22/24: EF 55-65%. Mild nonobstructive coronary artery disease. The ostial RCA stenosis appears to be less than seen last year. Suspect there was catheter induced spasm. Normal LV systolic function. Moderately elevated left ventricular end-diastolic pressure at 24 mmHg.  4. LADA - saw endocrinology Johnnie) 03/25 - A1c 03/18/24 reviewed and was 11.3%  5: Mild obstructive sleep apnea - saw pulmonology Herlene) 04/25 - home sleep study done 09/22/22 which showed mild obstructive sleep apnea with AHI of 8.5 and oxygen saturations as low as 82% nocturnally. Patient declined CPAP/oxygen.  - PFT's 04/22/22  6: Hyperlipidemia- - continue rosuvastatin  20mg  daily - LDL 10/09/23 reviewed and was elevated at 126 & has trended up over the last 2 years   Return in 3 months, sooner if needed.   Ellouise DELENA Class, FNP 07/17/24

## 2024-07-18 ENCOUNTER — Ambulatory Visit: Admitting: Family

## 2024-07-19 ENCOUNTER — Telehealth: Payer: Self-pay | Admitting: Family

## 2024-07-19 NOTE — Telephone Encounter (Signed)
 Called to confirm/remind patient of their appointment at the [facility] on 07/22/24.   Appointment:   [x] Confirmed  [] Left mess   [] No answer/No voice mail  [] VM Full/unable to leave message  [] Phone not in service  Patient reminded to bring all medications and/or complete list.  Confirmed patient has transportation. Gave directions, instructed to utilize valet parking.
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
## 2024-07-22 ENCOUNTER — Telehealth: Payer: Self-pay | Admitting: Family Medicine

## 2024-07-22 ENCOUNTER — Ambulatory Visit: Admitting: Family

## 2024-07-22 ENCOUNTER — Ambulatory Visit: Admitting: Family Medicine

## 2024-07-22 NOTE — Telephone Encounter (Addendum)
 Cassandra Remington, RN called and spoke to patient about her concerns. Her HF appointment has been rescheduled to allow her time to get transportation set up via her insurance. Patient was appreciative of this.
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
## 2024-07-22 NOTE — Telephone Encounter (Signed)
 Copied from CRM [id number]. Topic: Appointments - Appointment Info/Confirmation >> Jul 22, 2024 10:40 AM Cassandra Benson wrote: Patient is working on getting transportation for her appointment today and having some trouble finding any she didn't want to cancel or reschedule but she would like the doctor to call her to see what they can work out

## 2024-07-22 NOTE — Telephone Encounter (Deleted)
 Cassandra Benson

## 2024-07-23 ENCOUNTER — Telehealth: Payer: Self-pay

## 2024-07-23 ENCOUNTER — Telehealth: Payer: Self-pay | Admitting: Family Medicine

## 2024-07-23 NOTE — Telephone Encounter (Signed)
 Copied from CRM [id number]. Topic: General - Other >> Jul 23, 2024  3:38 PM Berneda FALCON wrote: Reason for CRM: Pt states she just missed a call from the clinic. I did not see any notes for the call. Called the CAL and asked if they needed to speak to her as this seems to be the 2nd time she has called today stating she missed a call. Spoke with Emmie who states she did not see anyone there calling as well. Line went dead and I am not sure why. Called CAL back and spoke to Washburn who states she also did not see any notes about a call to the patient and requested a CRM be sent to the clinic.  Patient callback is [phone]

## 2024-07-23 NOTE — Telephone Encounter (Signed)
 Copied from CRM [id number]. Topic: General - Call Back - No Documentation >> Jul 23, 2024  2:26 PM Dedra B wrote: Reason for CRM: Pt said she received call from clinic. Did not see documentation of call.

## 2024-07-23 NOTE — Telephone Encounter (Signed)
 Let me know if I have to place another [facility] referral thanks.

## 2024-07-23 NOTE — Telephone Encounter (Signed)
 Pt called to follow up on appt. Notified her that her appt was yesterday and that we Ronny RN) spoke to her yesterday about her appt. Pt confused. Pt states she does not remember speaking to anyone yesterday. Offered verbal comfort. Rescheduled the appt for this week.

## 2024-07-24 ENCOUNTER — Telehealth: Payer: Self-pay | Admitting: Family

## 2024-07-24 ENCOUNTER — Telehealth: Payer: Self-pay | Admitting: Family Medicine

## 2024-07-24 ENCOUNTER — Telehealth: Payer: Self-pay

## 2024-07-24 ENCOUNTER — Other Ambulatory Visit

## 2024-07-24 NOTE — Telephone Encounter (Signed)
 Copied from CRM [id number]. Topic: General - Call Back - No Documentation >> Jul 24, 2024  8:01 AM Cassandra Benson wrote: Reason for CRM: Pt states she received a call but missed it, I did not see anything documented saying someone called. I did see she has a telephone appointment but at 1PM. Unsure if someone wanted to call her sooner? Please advise.
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
## 2024-07-24 NOTE — Telephone Encounter (Signed)
 Called to confirm/remind patient of their appointment at the [facility] on 07/25/24.   Appointment:   [] Confirmed  [] Left mess   [] No answer/No voice mail  [x] VM Full/unable to leave message  [] Phone not in service  Patient reminded to bring all medications and/or complete list.  Confirmed patient has transportation. Gave directions, instructed to utilize valet parking.

## 2024-07-24 NOTE — Progress Notes (Deleted)
 SABRA

## 2024-07-24 NOTE — Telephone Encounter (Signed)
 Spoke with patient to inform her that her appt @ 1:00 with Pharmacist has been pushed back to 2:00 today. Pt understood and repeated message back to me.

## 2024-07-25 ENCOUNTER — Observation Stay

## 2024-07-25 ENCOUNTER — Other Ambulatory Visit: Payer: Self-pay

## 2024-07-25 ENCOUNTER — Telehealth: Payer: Self-pay | Admitting: Family

## 2024-07-25 ENCOUNTER — Observation Stay
Admission: EM | Admit: 2024-07-25 | Discharge: 2024-07-26 | Disposition: A | Discharge status: Home / Self Care | Attending: Family Medicine | Admitting: Family Medicine

## 2024-07-25 ENCOUNTER — Ambulatory Visit: Admitting: Family

## 2024-07-25 ENCOUNTER — Encounter: Payer: Self-pay | Admitting: Intensive Care

## 2024-07-25 ENCOUNTER — Emergency Department

## 2024-07-25 DIAGNOSIS — R2990 Unspecified symptoms and signs involving the nervous system: Secondary | ICD-10-CM | POA: Diagnosis present

## 2024-07-25 DIAGNOSIS — I5022 Chronic systolic (congestive) heart failure: Secondary | ICD-10-CM | POA: Diagnosis not present

## 2024-07-25 DIAGNOSIS — E785 Hyperlipidemia, unspecified: Secondary | ICD-10-CM | POA: Insufficient documentation

## 2024-07-25 DIAGNOSIS — R930 Abnormal findings on diagnostic imaging of skull and head, not elsewhere classified: Secondary | ICD-10-CM | POA: Diagnosis not present

## 2024-07-25 DIAGNOSIS — G319 Degenerative disease of nervous system, unspecified: Secondary | ICD-10-CM | POA: Diagnosis not present

## 2024-07-25 DIAGNOSIS — R519 Headache, unspecified: Secondary | ICD-10-CM | POA: Diagnosis not present

## 2024-07-25 DIAGNOSIS — M797 Fibromyalgia: Secondary | ICD-10-CM | POA: Diagnosis not present

## 2024-07-25 DIAGNOSIS — E039 Hypothyroidism, unspecified: Secondary | ICD-10-CM | POA: Insufficient documentation

## 2024-07-25 DIAGNOSIS — Z96641 Presence of right artificial hip joint: Secondary | ICD-10-CM | POA: Diagnosis not present

## 2024-07-25 DIAGNOSIS — I110 Hypertensive heart disease with heart failure: Secondary | ICD-10-CM | POA: Diagnosis not present

## 2024-07-25 DIAGNOSIS — R109 Unspecified abdominal pain: Secondary | ICD-10-CM | POA: Diagnosis not present

## 2024-07-25 DIAGNOSIS — R410 Disorientation, unspecified: Principal | ICD-10-CM

## 2024-07-25 DIAGNOSIS — R4182 Altered mental status, unspecified: Principal | ICD-10-CM | POA: Insufficient documentation

## 2024-07-25 DIAGNOSIS — M3500 Sicca syndrome, unspecified: Secondary | ICD-10-CM | POA: Diagnosis not present

## 2024-07-25 DIAGNOSIS — R0789 Other chest pain: Secondary | ICD-10-CM | POA: Diagnosis not present

## 2024-07-25 DIAGNOSIS — E109 Type 1 diabetes mellitus without complications: Secondary | ICD-10-CM | POA: Diagnosis not present

## 2024-07-25 DIAGNOSIS — I2510 Atherosclerotic heart disease of native coronary artery without angina pectoris: Secondary | ICD-10-CM | POA: Diagnosis not present

## 2024-07-25 DIAGNOSIS — F03B2 Unspecified dementia, moderate, with psychotic disturbance: Secondary | ICD-10-CM | POA: Diagnosis not present

## 2024-07-25 DIAGNOSIS — R079 Chest pain, unspecified: Secondary | ICD-10-CM | POA: Diagnosis not present

## 2024-07-25 DIAGNOSIS — G4489 Other headache syndrome: Secondary | ICD-10-CM | POA: Diagnosis not present

## 2024-07-25 DIAGNOSIS — I6782 Cerebral ischemia: Secondary | ICD-10-CM | POA: Diagnosis not present

## 2024-07-25 DIAGNOSIS — R0602 Shortness of breath: Secondary | ICD-10-CM | POA: Diagnosis not present

## 2024-07-25 DIAGNOSIS — G3184 Mild cognitive impairment, so stated: Secondary | ICD-10-CM | POA: Diagnosis present

## 2024-07-25 DIAGNOSIS — R569 Unspecified convulsions: Secondary | ICD-10-CM

## 2024-07-25 LAB — URINALYSIS, ROUTINE W REFLEX MICROSCOPIC
Bilirubin Urine: NEGATIVE
Glucose, UA: 50 mg/dL — AB
Hgb urine dipstick: NEGATIVE
Ketones, ur: NEGATIVE mg/dL
Leukocytes,Ua: NEGATIVE
Nitrite: NEGATIVE
Protein, ur: NEGATIVE mg/dL
Specific Gravity, Urine: 1.003 — ABNORMAL LOW (ref 1.005–1.030)
pH: 6 (ref 5.0–8.0)

## 2024-07-25 LAB — BASIC METABOLIC PANEL WITH GFR
Anion gap: 12 (ref 5–15)
BUN: 12 mg/dL (ref 8–23)
CO2: 25 mmol/L (ref 22–32)
Calcium: 9.4 mg/dL (ref 8.9–10.3)
Chloride: 99 mmol/L (ref 98–111)
Creatinine, Ser: 0.8 mg/dL (ref 0.44–1.00)
GFR, Estimated: >60 mL/min (ref >60)
Glucose, Bld: 290 mg/dL — ABNORMAL HIGH (ref 70–99)
Potassium: 4.3 mmol/L (ref 3.5–5.1)
Sodium: 136 mmol/L (ref 135–145)

## 2024-07-25 LAB — CBC
HCT: 42.9 % (ref 36.0–46.0)
Hemoglobin: 13.8 g/dL (ref 12.0–15.0)
MCH: 26.7 pg (ref 26.0–34.0)
MCHC: 32.2 g/dL (ref 30.0–36.0)
MCV: 83.1 fL (ref 80.0–100.0)
Platelets: 210 K/uL (ref 150–400)
RBC: 5.16 MIL/uL — ABNORMAL HIGH (ref 3.87–5.11)
RDW: 13.2 % (ref 11.5–15.5)
WBC: 6.9 K/uL (ref 4.0–10.5)
nRBC: 0 % (ref 0.0–0.2)

## 2024-07-25 LAB — RESP PANEL BY RT-PCR (RSV, FLU A&B, COVID)  RVPGX2
Influenza A by PCR: NEGATIVE
Influenza B by PCR: NEGATIVE
Resp Syncytial Virus by PCR: NEGATIVE
SARS Coronavirus 2 by RT PCR: NEGATIVE

## 2024-07-25 LAB — HEPATIC FUNCTION PANEL
ALT: 13 U/L (ref 0–44)
AST: 16 U/L (ref 15–41)
Albumin: 3.7 g/dL (ref 3.5–5.0)
Alkaline Phosphatase: 82 U/L (ref 38–126)
Bilirubin, Direct: <0.1 mg/dL (ref 0.0–0.2)
Total Bilirubin: 0.5 mg/dL (ref 0.0–1.2)
Total Protein: 6.8 g/dL (ref 6.5–8.1)

## 2024-07-25 LAB — TSH: TSH: 2.325 u[IU]/mL (ref 0.350–4.500)

## 2024-07-25 LAB — BRAIN NATRIURETIC PEPTIDE: B Natriuretic Peptide: 69.3 pg/mL (ref 0.0–100.0)

## 2024-07-25 LAB — SEDIMENTATION RATE: Sed Rate: 21 mm/h (ref 0–30)

## 2024-07-25 LAB — TROPONIN I (HIGH SENSITIVITY)
Troponin I (High Sensitivity): 3 ng/L (ref <18)
Troponin I (High Sensitivity): 3 ng/L (ref <18)

## 2024-07-25 MED ORDER — ROSUVASTATIN CALCIUM 20 MG PO TABS: 20.0000 mg | ORAL_TABLET | Freq: Every evening | ORAL | Status: DC

## 2024-07-25 MED ORDER — SENNOSIDES-DOCUSATE SODIUM 8.6-50 MG PO TABS
1.0000 | ORAL_TABLET | Freq: Every evening | ORAL | Status: DC | PRN
Start: 2024-07-25 — End: 2024-07-26

## 2024-07-25 MED ORDER — ALBUTEROL SULFATE (2.5 MG/3ML) 0.083% IN NEBU
3.0000 mL | INHALATION_SOLUTION | Freq: Four times a day (QID) | RESPIRATORY_TRACT | Status: DC | PRN
Start: 2024-07-25 — End: 2024-07-26

## 2024-07-25 MED ORDER — CLOPIDOGREL BISULFATE 75 MG PO TABS
75.0000 mg | ORAL_TABLET | Freq: Every day | ORAL | Status: DC
  Administered 2024-07-26: 75 mg via ORAL
  Filled 2024-07-25: qty 1

## 2024-07-25 MED ORDER — ASPIRIN 81 MG PO TBEC
81.0000 mg | DELAYED_RELEASE_TABLET | Freq: Every day | ORAL | Status: DC
  Administered 2024-07-25 – 2024-07-26 (×2): 81 mg via ORAL
  Filled 2024-07-25 (×2): qty 1

## 2024-07-25 MED ORDER — ASPIRIN 325 MG PO TABS: 325.0000 mg | ORAL_TABLET | Freq: Every day | ORAL | Status: DC

## 2024-07-25 MED ORDER — FUROSEMIDE 20 MG PO TABS
20.0000 mg | ORAL_TABLET | Freq: Every day | ORAL | Status: DC
  Administered 2024-07-26: 20 mg via ORAL
  Filled 2024-07-25: qty 1

## 2024-07-25 MED ORDER — ASPIRIN 300 MG RE SUPP: 300.0000 mg | Freq: Every day | RECTAL | Status: DC

## 2024-07-25 MED ORDER — HALOPERIDOL LACTATE 5 MG/ML IJ SOLN: 2.0000 mg | Freq: Four times a day (QID) | INTRAMUSCULAR | Status: DC | PRN

## 2024-07-25 MED ORDER — SUMATRIPTAN SUCCINATE 50 MG PO TABS
25.0000 mg | ORAL_TABLET | Freq: Once | ORAL | Status: AC
  Administered 2024-07-25: 25 mg via ORAL
  Filled 2024-07-25: qty 1

## 2024-07-25 MED ORDER — ACETAMINOPHEN 160 MG/5ML PO SOLN: 650.0000 mg | ORAL | Status: DC | PRN

## 2024-07-25 MED ORDER — ONDANSETRON HCL 4 MG/2ML IJ SOLN
4.0000 mg | Freq: Once | INTRAMUSCULAR | Status: AC
  Administered 2024-07-25: 4 mg via INTRAVENOUS
  Filled 2024-07-25: qty 2

## 2024-07-25 MED ORDER — HYDROMORPHONE HCL 1 MG/ML IJ SOLN: 0.5000 mg | INTRAMUSCULAR | Status: DC | PRN

## 2024-07-25 MED ORDER — STROKE: EARLY STAGES OF RECOVERY BOOK: Freq: Once | Status: AC

## 2024-07-25 MED ORDER — OXYCODONE HCL 5 MG PO TABS
5.0000 mg | ORAL_TABLET | Freq: Four times a day (QID) | ORAL | Status: DC | PRN
  Administered 2024-07-26: 5 mg via ORAL
  Filled 2024-07-25: qty 1

## 2024-07-25 MED ORDER — ACETAMINOPHEN 325 MG PO TABS: 650.0000 mg | ORAL_TABLET | ORAL | Status: DC | PRN

## 2024-07-25 MED ORDER — DONEPEZIL HCL 5 MG PO TABS: 10.0000 mg | ORAL_TABLET | Freq: Every day | ORAL | Status: DC

## 2024-07-25 MED ORDER — INSULIN ASPART 100 UNIT/ML IJ SOLN
0.0000 [IU] | Freq: Three times a day (TID) | INTRAMUSCULAR | Status: DC
  Administered 2024-07-26: 8 [IU] via SUBCUTANEOUS
  Filled 2024-07-25: qty 1

## 2024-07-25 MED ORDER — INSULIN GLARGINE-YFGN 100 UNIT/ML ~~LOC~~ SOLN
20.0000 [IU] | Freq: Two times a day (BID) | SUBCUTANEOUS | Status: DC
  Administered 2024-07-25 – 2024-07-26 (×2): 20 [IU] via SUBCUTANEOUS
  Filled 2024-07-25 (×3): qty 0.2

## 2024-07-25 MED ORDER — ENOXAPARIN SODIUM 40 MG/0.4ML IJ SOSY
40.0000 mg | PREFILLED_SYRINGE | INTRAMUSCULAR | Status: DC
  Administered 2024-07-25: 40 mg via SUBCUTANEOUS
  Filled 2024-07-25: qty 0.4

## 2024-07-25 MED ORDER — LEVOTHYROXINE SODIUM 50 MCG PO TABS
75.0000 ug | ORAL_TABLET | Freq: Every day | ORAL | Status: DC
  Administered 2024-07-26: 75 ug via ORAL
  Filled 2024-07-25: qty 1

## 2024-07-25 MED ORDER — SODIUM CHLORIDE 0.9 % IV SOLN: INTRAVENOUS | Status: DC

## 2024-07-25 MED ORDER — ACETAMINOPHEN 650 MG RE SUPP: 650.0000 mg | RECTAL | Status: DC | PRN
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
## 2024-07-25 NOTE — Telephone Encounter (Signed)
 Attempted to return pt's phone call to let her know that Dr. KANDICE was out of the office.  Patient answered phone and sounded verbally frustrated and upset.  She reported that she has been trying to get some help because she is not feeling well.  She is complaining of headache, increased shortness of breath and blurred vision. She cannot see well enough to call anyone on her phone. I let the patient know that I could call 911 for her.  Phone call to EMS, info given, they reported that someone would be dispatched.  Called pt back to let her know that I had called EMS and that they were one the way.  Asked her to be sure that front door was unlocked.  Asked her if I could call anyone else for her.  She asked that I call her son Donnice, attempted to call him, no answer.    Copied from CRM [id number]. Topic: Appointments - Appointment Info/Confirmation >> Jul 24, 2024  2:56 PM Anairis L wrote: Patient/patient representative is calling for information regarding an appointment. >> Jul 25, 2024  9:18 AM Victoria A wrote: Patient called said that she missed called from Dr.G yesterday and would like for him to call her back. Agent did not see an appt for yesterday but CMA called but there was no answer. Please call patient she said it takes her a little while to get to the phone.

## 2024-07-25 NOTE — ED Notes (Signed)
 Dinner tray provided

## 2024-07-25 NOTE — H&P (Signed)
 [facility] History and Physical   Patient: Cassandra Benson FMW:[id number]   PCP: Rilla Baller, MD DOB: 05/14/1941   DOA: 07/25/2024   DOS: 07/25/2024   DOS: the patient was seen and examined on 07/25/2024  Patient coming from: The patient is coming from Home  Chief Complaint: Headache, nausea and difficulty ambulation.  HPI: Aveleen Nevers is a 83 y.o. female with Past medical history of type 1 diabetes, diastolic CHF, carotid artery stenosis who presents stent, hypothyroid, dementia, fibromyalgia, systemic sclerosis, Sjogren's, as reviewed from EMR, presented at [facility] ED with complaining of shortness of breath and chest pain that started around 8 AM.  Patient is more worried about headache, nausea and difficulty ambulation which started around 5 AM today.  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  1 week ago she checked to [place]  were trying to break into her house and she called the police and notified her son.  As per patient's son there was no such incident happened at her house.  Patient might be having some delusions and hallucinations.  Patient had a cardiac cath done on 04/10/2024, nonobstructive CAD. Patient is still having chest pain off and on for more than a month. Currently patient has chest wall tenderness bilaterally, headache on right side 8/10, no any other complaints.  ED Course: VS temp 97.5, HR 84--55, RR 18, BP 157/74, 100% RA  CMP glucose 290, rest within normal range BNP 69 wnl Troponin x 2 negative CBC within normal range Negative COVID, flu and RSV UA negative  CT head:1. No acute intracranial abnormality. 2. Stable atrophy and chronic small vessel ischemia.  CXR: No active cardiopulmonary disease.    Review of Systems: as mentioned in the history of present illness.  All other systems reviewed and are  negative.  Past Medical History:  Diagnosis Date   Acute diverticulitis 04/2021   [facility] ER, CT confirmed   Allergy    ANA positive    positive ANA pattern 1 speckled   Arthritis    Carotid artery occlusion    Carotid stenosis, asymptomatic 06/19/2015   1-39% RICA 40-59% LICA rpt 1 yr (05/2015)    CHF (congestive heart failure) (HCC)    Colon polyps    COVID-19 virus infection 09/14/2021   Dermatomyositis (HCC)    Diabetes mellitus without complication (HCC)    Type 1   Diverticulosis    sigmoid on CT scan 12/2019   Family history of adverse reaction to anesthesia    brothr went into cardiac arrest from anectine   Fibromyalgia    prior PCP   GERD (gastroesophageal reflux disease)    prior PCP   Glaucoma    Narrow angle   History of blood clots    DVT, in 20s, none since   History of chicken pox    History of diverticulitis    History of pericarditis 1986   with hospitalization   History of pneumonia 2014   History of shingles    History of UTI    Hyperlipidemia    Hypertension    Hypothyroidism    Mixed connective tissue disease    Partial small bowel obstruction (HCC) 12/2019   managed conservatively   Peptic ulcer    Pneumonia    PONV (postoperative nausea and vomiting)    Raynaud's disease without gangrene    Shoulder  pain left   h/o RTC tendonitis and adhesive capsulitis   Sigmoid diverticulitis 05/26/2021   Sjogren's syndrome    Sleep apnea    prior PCP - no CPAP for about 10 yrs   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HCC)    Vitamin D  deficiency    prior PCP   Past Surgical History:  Procedure Laterality Date   ABDOMINAL HYSTERECTOMY  1978   fibroids and menorrhagia, ovaries remain   ARTERY BIOPSY Right 04/06/2018   Procedure: BIOPSY TEMPORAL ARTERY RIGHT;  Surgeon: Herminio Miu, MD;  Location: [facility];  Service: ENT;  Laterality: Right;  Diabetic - insulin  pump sleep apnea   CARDIAC CATHETERIZATION  2000   [facility] normal per patient    COLONOSCOPY  10/2011   1 TA, 1 HP, very tortuous colon ([place])   COLONOSCOPY  02/2020   TA, inflammatory polyp, mod diverticulosis, int/ext hemorrhoids ([place]) no rpt recommended    COLONOSCOPY WITH ESOPHAGOGASTRODUODENOSCOPY (EGD)  03/2007   2 ulcers, benign polyp, rpt 5 yrs [facility])   LEFT HEART CATH AND CORONARY ANGIOGRAPHY Left 04/22/2024   Procedure: LEFT HEART CATH AND CORONARY ANGIOGRAPHY;  Surgeon: Darron Deatrice LABOR, MD;  Location: [facility] INVASIVE CV LAB;  Service: Cardiovascular;  Laterality: Left;   PARTIAL HIP ARTHROPLASTY Right 2013   hip replacement   RIGHT HEART CATH AND CORONARY ANGIOGRAPHY Bilateral 12/02/2022   Procedure: RIGHT HEART CATH AND CORONARY ANGIOGRAPHY;  Surgeon: Mady Bruckner, MD;  Location: [facility] INVASIVE CV LAB;  Service: Cardiovascular;  Laterality: Bilateral;   TONSILLECTOMY     TONSILLECTOMY AND ADENOIDECTOMY     TRANSCAROTID ARTERY REVASCULARIZATION  Left 07/23/2018   Procedure: TRANSCAROTID ARTERY REVASCULARIZATION;  Surgeon: Gretta Bruckner PARAS, MD;  Location: [facility];  Service: Vascular;  Laterality: Left;   TUBAL LIGATION     VAGINAL DELIVERY     x2, no complications   Social History:  reports that she has never smoked. She has never used smokeless tobacco. She reports that she does not drink alcohol and does not use drugs.  Allergies  Allergen Reactions   Iodinated Contrast Media Other (See Comments)    Itching (severe) and chest tightness, Rash   Penicillins Anaphylaxis, Swelling and Rash        Amlodipine  Swelling    Pedal edema   Carvedilol  Dermatitis    Facial acne   Codeine Nausea Only   Gabapentin  Other (See Comments)    Gait abnormality   Influenza Vaccines Other (See Comments)    Muscle weakness; unable to walk   Insulin  Aspart (Human Analog) (Yeast) Other (See Comments)    headache   Nortriptyline  Other (See Comments)    Eye swelling and facial paralysis   Prednisone      Marked severe hyperglycemia to oral  and IM steroids   Valsartan Other (See Comments) and Cough    Allergy to generic only, Hacking cough   Zetia  [Ezetimibe ] Other (See Comments)    Bad muscle cramps   Anectine [Succinylcholine] Rash    Brother went into cardiac arrest.   Erythromycin Rash and Swelling   Sulfa Antibiotics Rash    Family history reviewed and not pertinent Family History  Problem Relation Age of Onset   CAD Mother 38       MI, aortic valve issues   COPD Mother    Lupus Mother    Yvone' disease Mother    Rheum arthritis Mother    CAD Father 36       CABG x2, aortic  valve replacement   Stroke Sister    CAD Sister    Anuerysm Sister        brain   Lupus Sister    Diabetes Sister    Diabetes Sister    Breast cancer Sister    Alcohol abuse Brother    CAD Brother 64       MI   Stroke Brother    COPD Brother        agent orange   CAD Brother 60       stent   Diabetes Brother    Stroke Maternal Grandmother    Hypertension Maternal Grandmother    Gallbladder disease Maternal Grandmother    Breast cancer Maternal Aunt    Breast cancer Maternal Aunt    Depression Grandchild    Colon cancer Neg Hx    Esophageal cancer Neg Hx    Rectal cancer Neg Hx    Stomach cancer Neg Hx      Prior to Admission medications   Medication Sig Start Date End Date Taking? Authorizing Provider  acetaminophen  (TYLENOL ) 500 MG tablet Take 500 mg by mouth every 6 (six) hours as needed for mild pain (pain score 1-3) or headache. 01/03/20   Rilla Baller, MD  albuterol  (VENTOLIN  HFA) 108 [phone] Base) MCG/ACT inhaler Inhale 2 puffs into the lungs every 6 (six) hours as needed. 01/10/24   Tamea Dedra CROME, MD  Ascorbic Acid  (VITAMIN C ) 100 MG tablet Take 100 mg by mouth daily.    [provider]  Blood Glucose Monitoring Suppl (PRODIGY AUTOCODE BLOOD GLUCOSE) DEVI 1 Device by Does not apply route daily. 05/17/24   Rilla Baller, MD  Cholecalciferol  (VITAMIN D3) 25 MCG (1000 UT) CAPS Take 2 capsules (2,000  Units total) by mouth daily. 11/04/22   Rilla Baller, MD  clopidogrel  (PLAVIX ) 75 MG tablet TAKE 1 TABLET(75 MG) BY MOUTH EVERY EVENING 11/07/23   Gretta Lonni PARAS, MD  Continuous Glucose Receiver (FREESTYLE LIBRE 3 READER) DEVI Use to check blood sugar continuously. Z79.4. E13.65 06/30/24   Rilla Baller, MD  Continuous Glucose Sensor (FREESTYLE LIBRE 3 PLUS SENSOR) MISC Change sensor every 15 days. E13.65, Z79.4 06/30/24   Rilla Baller, MD  donepezil  (ARICEPT ) 10 MG tablet Take 1 tablet (10 mg total) by mouth at bedtime. 05/28/24   Rilla Baller, MD  Fluticasone-Umeclidin-Vilant (TRELEGY ELLIPTA ) 100-62.5-25 MCG/ACT AEPB Inhale 1 puff into the lungs daily. 07/11/24   Tamea Dedra CROME, MD  furosemide  (LASIX ) 20 MG tablet Take 1 tablet (20 mg total) by mouth daily. 04/16/24   Rilla Baller, MD  Glucagon , rDNA, (GLUCAGON  EMERGENCY) 1 MG KIT INJECT into THE muscle ONCE AS NEEDED FOR emergency 11/18/21   Trixie File, MD  glucose blood (PRODIGY NO CODING BLOOD GLUC) test strip Use as instructed 05/17/24   Rilla Baller, MD  insulin  degludec (TRESIBA  FLEXTOUCH) 100 UNIT/ML FlexTouch Pen Inject 60 Units into the skin at bedtime. 05/10/24   Rilla Baller, MD  insulin  lispro (HUMALOG ) 100 UNIT/ML injection Inject 0.07 mLs (7 Units total) into the skin 3 (three) times daily before meals. Dose per sliding scale 05/10/24   Rilla Baller, MD  isosorbide  mononitrate (IMDUR ) 30 MG 24 hr tablet Take 1 tablet (30 mg total) by mouth daily. 04/10/24 04/10/25  Furth, Cadence H, PA-C  Lancets (ONETOUCH DELICA PLUS [place]) MISC Apply topically. 05/17/24   [provider]  losartan  (COZAAR ) 50 MG tablet Take 50 mg by mouth daily.    [provider]  metoprolol  succinate (  TOPROL -XL) 25 MG 24 hr tablet Take 25 mg by mouth daily.    [provider]  Multiple Vitamin (MULTIVITAMIN ADULT) TABS Take 1 tablet by mouth in the morning and at bedtime. 01/18/22   Rilla Baller, MD  Multiple Vitamins-Minerals (PRESERVISION AREDS PO) Take 1 tablet by mouth in the morning and at bedtime.    [provider]  nitroGLYCERIN  (NITROSTAT ) 0.4 MG SL tablet Place 1 tablet (0.4 mg total) under the tongue every 5 (five) minutes as needed for chest pain. 12/02/22 07/11/24  End, Lonni, MD  potassium chloride  (KLOR-CON ) 10 MEQ tablet Take 1 tablet (10 mEq total) by mouth daily. 06/17/24   Gollan, Timothy J, MD  rosuvastatin  (CRESTOR ) 20 MG tablet Take 1 tablet (20 mg total) by mouth every evening. 11/11/23   Rilla Baller, MD  SYNTHROID  75 MCG tablet TAKE ONE TABLET BY MOUTH Monday-Saturday BEFORE breakfast 11/11/23   Rilla Baller, MD    Physical Exam: Vitals:   07/25/24 1338 07/25/24 1340 07/25/24 1709  BP:  (!) 157/74 (!) 145/64  Pulse:  84 (!) 55  Resp:  18 18  Temp:  (!) 97.5 F (36.4 C)   TempSrc:  Oral   SpO2:  100% 98%  Weight: 72.6 kg    Height: 5' 2.5 (1.588 m)      General:  and oriented to time, place, and person. Appear in mild distress, affect appropriate Eyes: PERRLA, Conjunctiva normal ENT: Oral Mucosa Clear, moist  Neck: no JVD, no Abnormal Mass Or lumps Cardiovascular: S1 and S2 Present, no Murmur, peripheral pulses symmetrical Respiratory: good respiratory effort, Bilateral Air entry equal and Decreased, no signs of accessory muscle use, Clear to Auscultation, no Crackles, no wheezes Abdomen: Bowel Sound present, Soft and no tenderness. Skin: no rashes  Extremities: no Pedal edema, no calf tenderness Neurologic: without any new focal findings Gait not checked due to patient safety concerns  Data Reviewed: I have personally reviewed and interpreted labs, imaging as discussed below.  CBC: Recent Labs  Lab 07/25/24 1348  WBC 6.9  HGB 13.8  HCT 42.9  MCV 83.1  PLT 210   Basic Metabolic Panel: Recent Labs  Lab 07/25/24 1348  NA 136  K 4.3  CL 99  CO2 25  GLUCOSE 290*  BUN 12  CREATININE 0.80  CALCIUM  9.4    GFR: Estimated Creatinine Clearance: 51.2 mL/min (by C-G formula based on SCr of 0.8 mg/dL). Liver Function Tests: Recent Labs  Lab 07/25/24 1348  AST 16  ALT 13  ALKPHOS 82  BILITOT 0.5  PROT 6.8  ALBUMIN  3.7   No results for input(s): LIPASE, AMYLASE in the last 168 hours. No results for input(s): AMMONIA in the last 168 hours. Coagulation Profile: No results for input(s): INR, PROTIME in the last 168 hours. Cardiac Enzymes: No results for input(s): CKTOTAL, CKMB, CKMBINDEX, TROPONINI in the last 168 hours. BNP (last 3 results) No results for input(s): PROBNP in the last 8760 hours. HbA1C: No results for input(s): HGBA1C in the last 72 hours. CBG: No results for input(s): GLUCAP in the last 168 hours. Lipid Profile: No results for input(s): CHOL, HDL, LDLCALC, TRIG, CHOLHDL, LDLDIRECT in the last 72 hours. Thyroid  Function Tests: No results for input(s): TSH, T4TOTAL, FREET4, T3FREE, THYROIDAB in the last 72 hours. Anemia Panel: No results for input(s): VITAMINB12, FOLATE, FERRITIN, TIBC, IRON, RETICCTPCT in the last 72 hours. Urine analysis:    Component Value Date/Time   COLORURINE STRAW (A) 07/25/2024 1707   APPEARANCEUR  CLEAR (A) 07/25/2024 1707   LABSPEC 1.003 (L) 07/25/2024 1707   PHURINE 6.0 07/25/2024 1707   GLUCOSEU 50 (A) 07/25/2024 1707   HGBUR NEGATIVE 07/25/2024 1707   BILIRUBINUR NEGATIVE 07/25/2024 1707   BILIRUBINUR negative 02/25/2022 1553   KETONESUR NEGATIVE 07/25/2024 1707   PROTEINUR NEGATIVE 07/25/2024 1707   UROBILINOGEN 0.2 02/25/2022 1553   NITRITE NEGATIVE 07/25/2024 1707   LEUKOCYTESUR NEGATIVE 07/25/2024 1707    Radiological Exams on Admission: CT HEAD WO CONTRAST ( ) Result Date: 07/25/2024 EXAM: CT HEAD WITHOUT CONTRAST 07/25/2024 05:04:22 PM TECHNIQUE: CT of the head was performed without the administration of intravenous contrast. Automated exposure control, iterative  reconstruction, and/or weight based adjustment of the mA/kV was utilized to reduce the radiation dose to as low as reasonably achievable. COMPARISON: 11/30/2023 CLINICAL HISTORY: Headache, new onset (Age >= 51y). FINDINGS: BRAIN AND VENTRICLES: No acute hemorrhage. No evidence of acute infarct. Proportional prominence of the ventricles and sulci, consistent with diffuse cerebral parenchymal volume loss. Scattered periventricular white matter hypoattenuation, most consistent with mild chronic ischemic microvascular disease. No extra-axial collection. No mass effect or midline shift. ORBITS: No acute abnormality. SINUSES: No acute abnormality. SOFT TISSUES AND SKULL: No acute soft tissue abnormality. No skull fracture. Calcified atherosclerotic plaque within cavernous/supraclinoid internal carotid arteries. IMPRESSION: 1. No acute intracranial abnormality. 2. Stable atrophy and chronic small vessel ischemia. Electronically signed by: Andrea Gasman MD 07/25/2024 05:16 PM EDT RP Workstation: [id number]   DG Chest 2 View Result Date: 07/25/2024 CLINICAL DATA:  Chest pain. EXAM: CHEST - 2 VIEW COMPARISON:  Chest radiograph dated 04/05/2024. FINDINGS: No focal consolidation, pleural effusion, or pneumothorax. The cardiac silhouette is within limits. No acute osseous pathology. IMPRESSION: No active cardiopulmonary disease. Electronically Signed   By: Vanetta Chou M.D.   On: 07/25/2024 14:48   EKG: Independently reviewed. normal EKG, normal sinus rhythm. Echocardiogram: Pending   I reviewed all nursing notes, pharmacy notes, vitals, pertinent old records.  Assessment/Plan Principal Problem:   Stroke-like symptoms   # AMS r/o TIA vs seizure vs worsening of dementia with delusions and hallucinations. Continue to monitor on telemetry Continue neurocheck as per protocol Started aspirin  81 mg p.o. daily, continued Plavix  and statin Follow-up MRI and TTE Follow EEG Check lipid profile, TSH and  A1c Follow neurology consult, please call tomorrow a.m. Consult placed for neuro Follow PT and OT eval follow TOC  # Right sided Headache possible migraine Continue Tylenol  as needed Imitrex 25 mg x 1 dose given F/u MRI brain Follow neurology  # Dementia with delusions and hallucinations Continue Aricept  Patient is not on any antidepressant or antipsychotic medication Use Haldol as needed Continue delirium precautions Follow psych consult, please call tomorrow a.m. consult placed. Patient is very adamant, does not want to be seen by psych but I placed a consult anyway, she may change her mind.   # Carotid artery stenosis s/p stent Continue Plavix  and statin  # Diastolic CHF, no CP noticed Continued Lasix  20 mg p.o. daily  # Type 1 diabetes mellitus Start NovoLog  sliding Started low-dose Semglee  20 units twice daily Diabetic coordinator consult placed Monitor CBG, continue diabetic diet Titrate dose of insulin  accordingly  # Hypothyroid on Synthroid   # Chest wall pain, musculoskeletal most likely due to fibromyalgia Continue as needed medication for pain control   Nutrition: Cardiac diet DVT Prophylaxis: Subcutaneous Lovenox   Advance goals of care discussion: Full code   Consults:  Neurology and psychiatry, consults placed, need to call tomorrow a.m.  Family  Communication: family was not present at bedside, at the time of interview.  Opportunity was given to ask question and all questions were answered satisfactorily.  Disposition: Admitted as observation, telemetry unit. Likely to be discharged Home, in 1-2 days when satble and cleared by Neuro and psych.  I have discussed plan of care as described above with RN and patient/family.  Severity of Illness: The appropriate patient status for this patient is OBSERVATION. Observation status is judged to be reasonable and necessary in order to provide the required intensity of service to ensure the patient's safety.  The patient's presenting symptoms, physical exam findings, and initial radiographic and laboratory data in the context of their medical condition is felt to place them at decreased risk for further clinical deterioration. Furthermore, it is anticipated that the patient will be medically stable for discharge from the hospital within 2 midnights of admission.    Author: ELVAN SOR, MD [facility] 07/25/2024 6:51 PM   To reach On-call, see care teams to locate the attending and reach out to them via [email or web address]. If 7PM-7AM, please contact night-coverage If you still have difficulty reaching the attending provider, please page the [facility] (Director on Call) for [facility] on amion for assistance.

## 2024-07-25 NOTE — Telephone Encounter (Signed)
 Patient did not show for her [facility] Failure Clinic appointment on 07/25/24. This is the 2nd appointment in the last 2 weeks that she has missed.

## 2024-07-25 NOTE — ED Triage Notes (Signed)
 Arrived by [facility] from home. C/o sob and left sided chest pain that started this morning around 8am. Also reports lost vision briefly then subsided.   Denies SOB or headache  Type 1 diabetic   EMS administered: 1 spray nitroglycerin  324 aspirin   20G right AC  EMS vitals: 151/85 b/p 97% RA 87HR 245CBG

## 2024-07-26 ENCOUNTER — Observation Stay (HOSPITAL_BASED_OUTPATIENT_CLINIC_OR_DEPARTMENT_OTHER)
Admit: 2024-07-26 | Discharge: 2024-07-26 | Disposition: A | Discharge status: Home / Self Care | Attending: Student | Admitting: Student

## 2024-07-26 ENCOUNTER — Observation Stay

## 2024-07-26 DIAGNOSIS — R4189 Other symptoms and signs involving cognitive functions and awareness: Secondary | ICD-10-CM | POA: Diagnosis not present

## 2024-07-26 DIAGNOSIS — R4182 Altered mental status, unspecified: Secondary | ICD-10-CM | POA: Diagnosis not present

## 2024-07-26 DIAGNOSIS — I509 Heart failure, unspecified: Secondary | ICD-10-CM | POA: Diagnosis not present

## 2024-07-26 DIAGNOSIS — R079 Chest pain, unspecified: Secondary | ICD-10-CM

## 2024-07-26 DIAGNOSIS — R569 Unspecified convulsions: Secondary | ICD-10-CM | POA: Diagnosis not present

## 2024-07-26 DIAGNOSIS — G3184 Mild cognitive impairment, so stated: Secondary | ICD-10-CM

## 2024-07-26 DIAGNOSIS — R2990 Unspecified symptoms and signs involving the nervous system: Secondary | ICD-10-CM | POA: Diagnosis not present

## 2024-07-26 DIAGNOSIS — R451 Restlessness and agitation: Secondary | ICD-10-CM | POA: Diagnosis not present

## 2024-07-26 LAB — BASIC METABOLIC PANEL WITH GFR
Anion gap: 10 (ref 5–15)
BUN: 18 mg/dL (ref 8–23)
CO2: 26 mmol/L (ref 22–32)
Calcium: 9 mg/dL (ref 8.9–10.3)
Chloride: 105 mmol/L (ref 98–111)
Creatinine, Ser: 0.89 mg/dL (ref 0.44–1.00)
GFR, Estimated: >60 mL/min (ref >60)
Glucose, Bld: 263 mg/dL — ABNORMAL HIGH (ref 70–99)
Potassium: 3.6 mmol/L (ref 3.5–5.1)
Sodium: 141 mmol/L (ref 135–145)

## 2024-07-26 LAB — GLUCOSE, CAPILLARY
Glucose-Capillary: 257 mg/dL — ABNORMAL HIGH (ref 70–99)
Glucose-Capillary: 162 mg/dL — ABNORMAL HIGH (ref 70–99)
Glucose-Capillary: 99 mg/dL (ref 70–99)

## 2024-07-26 LAB — ECHOCARDIOGRAM COMPLETE
AR max vel: 1.84 cm2
AV Area VTI: 1.69 cm2
AV Area mean vel: 1.8 cm2
AV Mean grad: 2 mmHg
AV Peak grad: 3.2 mmHg
Ao pk vel: 0.89 m/s
Area-P 1/2: 2.99 cm2
Height: 62.5 in
MV VTI: 1.15 cm2
S' Lateral: 1.83 cm
Single Plane A4C EF: 55.1 %
Weight: 2440.93 [oz_av]

## 2024-07-26 LAB — LIPID PANEL
Cholesterol: 262 mg/dL — ABNORMAL HIGH (ref 0–200)
HDL: 37 mg/dL — ABNORMAL LOW (ref >40)
LDL Cholesterol: 158 mg/dL — ABNORMAL HIGH (ref 0–99)
Total CHOL/HDL Ratio: 7.1 ratio
Triglycerides: 334 mg/dL — ABNORMAL HIGH (ref <150)
VLDL: 67 mg/dL — ABNORMAL HIGH (ref 0–40)

## 2024-07-26 LAB — CBC
HCT: 38.5 % (ref 36.0–46.0)
Hemoglobin: 12.6 g/dL (ref 12.0–15.0)
MCH: 27.1 pg (ref 26.0–34.0)
MCHC: 32.7 g/dL (ref 30.0–36.0)
MCV: 82.8 fL (ref 80.0–100.0)
Platelets: 190 K/uL (ref 150–400)
RBC: 4.65 MIL/uL (ref 3.87–5.11)
RDW: 13.4 % (ref 11.5–15.5)
WBC: 7.7 K/uL (ref 4.0–10.5)
nRBC: 0 % (ref 0.0–0.2)

## 2024-07-26 LAB — MAGNESIUM: Magnesium: 2.1 mg/dL (ref 1.7–2.4)

## 2024-07-26 LAB — PHOSPHORUS: Phosphorus: 3.6 mg/dL (ref 2.5–4.6)

## 2024-07-26 LAB — HEMOGLOBIN A1C
Hgb A1c MFr Bld: 10.7 % — ABNORMAL HIGH (ref 4.8–5.6)
Mean Plasma Glucose: 260.39 mg/dL

## 2024-07-26 MED ORDER — PERFLUTREN LIPID MICROSPHERE
1.0000 mL | INTRAVENOUS | Status: AC | PRN
  Administered 2024-07-26: 2 mL via INTRAVENOUS

## 2024-07-26 MED ORDER — BUDESON-GLYCOPYRROL-FORMOTEROL 160-9-4.8 MCG/ACT IN AERO
2.0000 | INHALATION_SPRAY | Freq: Two times a day (BID) | RESPIRATORY_TRACT | Status: DC
  Administered 2024-07-26: 2 via RESPIRATORY_TRACT
  Filled 2024-07-26: qty 5.9

## 2024-07-26 MED ORDER — INSULIN ASPART 100 UNIT/ML IJ SOLN: 0.0000 [IU] | INTRAMUSCULAR | Status: DC

## 2024-07-26 MED ORDER — NITROGLYCERIN 0.4 MG SL SUBL
0.4000 mg | SUBLINGUAL_TABLET | SUBLINGUAL | Status: DC | PRN
  Administered 2024-07-26: 0.4 mg via SUBLINGUAL
  Filled 2024-07-26: qty 1

## 2024-07-26 MED ORDER — INSULIN ASPART 100 UNIT/ML IJ SOLN: 5.0000 [IU] | Freq: Three times a day (TID) | INTRAMUSCULAR | Status: DC

## 2024-07-26 NOTE — Progress Notes (Signed)
 42 min eeg completed

## 2024-07-26 NOTE — Telephone Encounter (Signed)
Noted Will follow hospitalization

## 2024-07-26 NOTE — Procedures (Signed)
 Patient Name: Cassandra Benson  MRN: [id number]  Epilepsy Attending: Arlin MALVA Krebs  Referring Physician/Provider: Von Bellis, MD  Date: 07/26/2024 Duration: 42.17 mins  Patient history: 83yo F with ams. EEG to evaluate for seizure  Level of alertness: Awake, asleep  AEDs during EEG study: None  Technical aspects: This EEG study was done with scalp electrodes positioned according to the 10-20 International system of electrode placement. Electrical activity was reviewed with band pass filter of 1-70Hz , sensitivity of 7 uV/mm, display speed of 20mm/sec with a 60Hz  notched filter applied as appropriate. EEG data were recorded continuously and digitally stored.  Video monitoring was available and reviewed as appropriate.  Description: The posterior dominant rhythm consists of 8-9Hz  activity of moderate voltage (25-35 uV) seen predominantly in posterior head regions, symmetric and reactive to eye opening and eye closing. Sleep was characterized by vertex waves, sleep spindles (12 to 14 Hz), maximal frontocentral region. Hyperventilation and photic stimulation were not performed.     IMPRESSION: This study is within normal limits. No seizures or epileptiform discharges were seen throughout the recording.  A normal interictal EEG does not exclude the diagnosis of epilepsy.   Cassandra Benson Cassandra Benson

## 2024-07-26 NOTE — Consult Note (Signed)
 Reason for Consult:AMS Requesting Physician: Dezii  CC: Chest pain  I have been asked by Dr. Leesa to see this patient in consultation for AMS.  HPI: Cassandra Benson is an 83 y.o. female  with past medical history of type 1 diabetes, diastolic CHF, carotid artery stenosis s/p stent, hypothyroid, dementia, fibromyalgia, systemic sclerosis, Sjogren's who presented  complaining of shortness of breath and chest pain that started around 8 AM.  Patient is more worried about headache, nausea and difficulty ambulation which started around 5 AM today.  Patient is not a very good historian.  Slightly agitated.  Does not speak of chest pain today but reports that she had an episode where she lost vision completely lasting a few minutes, then resolved spontaneously.  She reports that this has happened before.   Per chart, one week ago she had an episode where she felt someone was trying to break into her house and she called the police and notified her son.  As per patient's son there was no such incident happened at her house.   Followed by neurology on an outpatient basis.  Past Medical History:  Diagnosis Date   Acute diverticulitis 04/2021   [facility] ER, CT confirmed   Allergy    ANA positive    positive ANA pattern 1 speckled   Arthritis    Carotid artery occlusion    Carotid stenosis, asymptomatic 06/19/2015   1-39% RICA 40-59% LICA rpt 1 yr (05/2015)    CHF (congestive heart failure) (HCC)    Colon polyps    COVID-19 virus infection 09/14/2021   Dermatomyositis (HCC)    Diabetes mellitus without complication (HCC)    Type 1   Diverticulosis    sigmoid on CT scan 12/2019   Family history of adverse reaction to anesthesia    brothr went into cardiac arrest from anectine   Fibromyalgia    prior PCP   GERD (gastroesophageal reflux disease)    prior PCP   Glaucoma    Narrow angle   History of blood clots    DVT, in 20s, none since   History of chicken pox    History of diverticulitis     History of pericarditis 1986   with hospitalization   History of pneumonia 2014   History of shingles    History of UTI    Hyperlipidemia    Hypertension    Hypothyroidism    Mixed connective tissue disease    Partial small bowel obstruction (HCC) 12/2019   managed conservatively   Peptic ulcer    Pneumonia    PONV (postoperative nausea and vomiting)    Raynaud's disease without gangrene    Shoulder pain left   h/o RTC tendonitis and adhesive capsulitis   Sigmoid diverticulitis 05/26/2021   Sjogren's syndrome    Sleep apnea    prior PCP - no CPAP for about 10 yrs   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HCC)    Vitamin D  deficiency    prior PCP    Past Surgical History:  Procedure Laterality Date   ABDOMINAL HYSTERECTOMY  1978   fibroids and menorrhagia, ovaries remain   ARTERY BIOPSY Right 04/06/2018   Procedure: BIOPSY TEMPORAL ARTERY RIGHT;  Surgeon: Herminio Miu, MD;  Location: [facility];  Service: ENT;  Laterality: Right;  Diabetic - insulin  pump sleep apnea   CARDIAC CATHETERIZATION  2000   [facility] normal per patient   COLONOSCOPY  10/2011   1 TA, 1 HP, very tortuous colon ([place])  COLONOSCOPY  02/2020   TA, inflammatory polyp, mod diverticulosis, int/ext hemorrhoids ([place]rtle) no rpt recommended    COLONOSCOPY WITH ESOPHAGOGASTRODUODENOSCOPY (EGD)  03/2007   2 ulcers, benign polyp, rpt 5 yrs [facility], [facility])   LEFT HEART CATH AND CORONARY ANGIOGRAPHY Left 04/22/2024   Procedure: LEFT HEART CATH AND CORONARY ANGIOGRAPHY;  Surgeon: Darron Deatrice LABOR, MD;  Location: [facility] INVASIVE CV LAB;  Service: Cardiovascular;  Laterality: Left;   PARTIAL HIP ARTHROPLASTY Right 2013   hip replacement   RIGHT HEART CATH AND CORONARY ANGIOGRAPHY Bilateral 12/02/2022   Procedure: RIGHT HEART CATH AND CORONARY ANGIOGRAPHY;  Surgeon: Mady Bruckner, MD;  Location: [facility] INVASIVE CV LAB;  Service: Cardiovascular;  Laterality: Bilateral;   TONSILLECTOMY     TONSILLECTOMY AND  ADENOIDECTOMY     TRANSCAROTID ARTERY REVASCULARIZATION  Left 07/23/2018   Procedure: TRANSCAROTID ARTERY REVASCULARIZATION;  Surgeon: Gretta Bruckner PARAS, MD;  Location: [facility] OR;  Service: Vascular;  Laterality: Left;   TUBAL LIGATION     VAGINAL DELIVERY     x2, no complications    Family History  Problem Relation Age of Onset   CAD Mother 59       MI, aortic valve issues   COPD Mother    Lupus Mother    Yvone' disease Mother    Rheum arthritis Mother    CAD Father 40       CABG x2, aortic valve replacement   Stroke Sister    CAD Sister    Anuerysm Sister        brain   Lupus Sister    Diabetes Sister    Diabetes Sister    Breast cancer Sister    Alcohol abuse Brother    CAD Brother 95       MI   Stroke Brother    COPD Brother        agent orange   CAD Brother 60       stent   Diabetes Brother    Stroke Maternal Grandmother    Hypertension Maternal Grandmother    Gallbladder disease Maternal Grandmother    Breast cancer Maternal Aunt    Breast cancer Maternal Aunt    Depression Grandchild    Colon cancer Neg Hx    Esophageal cancer Neg Hx    Rectal cancer Neg Hx    Stomach cancer Neg Hx     Social History:  reports that she has never smoked. She has never used smokeless tobacco. She reports that she does not drink alcohol and does not use drugs.  Allergies  Allergen Reactions   Iodinated Contrast Media Other (See Comments)    Itching (severe) and chest tightness, Rash   Penicillins Anaphylaxis, Swelling and Rash        Amlodipine  Swelling    Pedal edema   Carvedilol  Dermatitis    Facial acne   Codeine Nausea Only   Gabapentin  Other (See Comments)    Gait abnormality   Influenza Vaccines Other (See Comments)    Muscle weakness; unable to walk   Insulin  Aspart (Human Analog) (Yeast) Other (See Comments)    headache   Nortriptyline  Other (See Comments)    Eye swelling and facial paralysis   Prednisone      Marked severe hyperglycemia to oral and IM  steroids   Valsartan Other (See Comments) and Cough    Allergy to generic only, Hacking cough   Zetia  [Ezetimibe ] Other (See Comments)    Bad muscle cramps   Anectine [Succinylcholine] Rash  Brother went into cardiac arrest.   Erythromycin Rash and Swelling   Sulfa Antibiotics Rash    Medications: Prior to Admission:  Medications Prior to Admission  Medication Sig Dispense Refill Last Dose/Taking   acetaminophen  (TYLENOL ) 500 MG tablet Take 500 mg by mouth every 6 (six) hours as needed for mild pain (pain score 1-3) or headache.   Unknown   albuterol  (VENTOLIN  HFA) 108 (90 Base) MCG/ACT inhaler Inhale 2 puffs into the lungs every 6 (six) hours as needed. 8 g 2 Unknown   Ascorbic Acid  (VITAMIN C ) 100 MG tablet Take 100 mg by mouth daily.   Past Week   Cholecalciferol  (VITAMIN D3) 25 MCG (1000 UT) CAPS Take 2 capsules (2,000 Units total) by mouth daily.   Past Week   clopidogrel  (PLAVIX ) 75 MG tablet TAKE 1 TABLET(75 MG) BY MOUTH EVERY EVENING 30 tablet 11 Past Week   donepezil  (ARICEPT ) 10 MG tablet Take 1 tablet (10 mg total) by mouth at bedtime. 90 tablet 1 Past Week   Fluticasone-Umeclidin-Vilant (TRELEGY ELLIPTA ) 100-62.5-25 MCG/ACT AEPB Inhale 1 puff into the lungs daily. 60 each 6 Past Week   furosemide  (LASIX ) 20 MG tablet Take 1 tablet (20 mg total) by mouth daily. 30 tablet 3 Past Week   Glucagon , rDNA, (GLUCAGON  EMERGENCY) 1 MG KIT INJECT into THE muscle ONCE AS NEEDED FOR emergency 1 kit 12 Taking   insulin  degludec (TRESIBA  FLEXTOUCH) 100 UNIT/ML FlexTouch Pen Inject 60 Units into the skin at bedtime. 18 mL 0 Past Week   insulin  lispro (HUMALOG ) 100 UNIT/ML injection Inject 0.07 mLs (7 Units total) into the skin 3 (three) times daily before meals. Dose per sliding scale 10 mL 0 Past Week   isosorbide  mononitrate (IMDUR ) 30 MG 24 hr tablet Take 1 tablet (30 mg total) by mouth daily. 90 tablet 3 Past Week   losartan  (COZAAR ) 50 MG tablet Take 50 mg by mouth daily.   Past Week    metoprolol  succinate (TOPROL -XL) 25 MG 24 hr tablet Take 25 mg by mouth daily.   Past Week   Multiple Vitamin (MULTIVITAMIN ADULT) TABS Take 1 tablet by mouth in the morning and at bedtime.   Past Week   Multiple Vitamins-Minerals (PRESERVISION AREDS PO) Take 1 tablet by mouth in the morning and at bedtime.   Past Week   nitroGLYCERIN  (NITROSTAT ) 0.4 MG SL tablet Place 1 tablet (0.4 mg total) under the tongue every 5 (five) minutes as needed for chest pain. 25 tablet PRN Unknown   potassium chloride  (KLOR-CON ) 10 MEQ tablet Take 1 tablet (10 mEq total) by mouth daily. 90 tablet 3 Past Week   rosuvastatin  (CRESTOR ) 20 MG tablet Take 1 tablet (20 mg total) by mouth every evening. 90 tablet 4 Past Week   SYNTHROID  75 MCG tablet TAKE ONE TABLET BY MOUTH Monday-Saturday BEFORE breakfast 80 tablet 4 Past Week   Blood Glucose Monitoring Suppl (PRODIGY AUTOCODE BLOOD GLUCOSE) DEVI 1 Device by Does not apply route daily.      Continuous Glucose Receiver (FREESTYLE LIBRE 3 READER) DEVI Use to check blood sugar continuously. Z79.4. E13.65 1 each 0    Continuous Glucose Sensor (FREESTYLE LIBRE 3 PLUS SENSOR) MISC Change sensor every 15 days. E13.65, Z79.4 6 each 3    glucose blood (PRODIGY NO CODING BLOOD GLUC) test strip Use as instructed 100 each 12    Lancets (ONETOUCH DELICA PLUS LANCET33G) MISC Apply topically.       ROS: History obtained from the patient  General ROS:  negative for - chills, fatigue, fever, night sweats, weight gain or weight loss Psychological ROS: negative for - behavioral disorder, hallucinations, memory difficulties, mood swings or suicidal ideation Ophthalmic ROS: negative for - blurry vision, double vision, eye pain or loss of vision ENT ROS: negative for - epistaxis, nasal discharge, oral lesions, sore throat, tinnitus or vertigo Allergy and Immunology ROS: negative for - hives or itchy/watery eyes Hematological and Lymphatic ROS: negative for - bleeding problems, bruising  or swollen lymph nodes Endocrine ROS: negative for - galactorrhea, hair pattern changes, polydipsia/polyuria or temperature intolerance Respiratory ROS: negative for - cough, hemoptysis, shortness of breath or wheezing Cardiovascular MND:ryzdu pain Gastrointestinal ROS: negative for - abdominal pain, diarrhea, hematemesis, nausea/vomiting or stool incontinence Genito-Urinary ROS: negative for - dysuria, hematuria, incontinence or urinary frequency/urgency Musculoskeletal ROS: right hip pain Neurological ROS: as noted in HPI Dermatological ROS: negative for rash and skin lesion changes   Physical Examination: Blood pressure 95/82, pulse 68, temperature 97.7 F (36.5 C), temperature source Oral, resp. rate 17, height 5' 2.5 (1.588 m), weight 69.2 kg, SpO2 96%.  HEENT-  Normocephalic, no lesions, without obvious abnormality.  Normal external eye and conjunctiva.  Normal TM's bilaterally.  Normal auditory canals and external ears. Normal external nose, mucus membranes and septum.  Normal pharynx. Cardiovascular- Single S1, S2, pulses palpable throughout   Lungs- CTA Abdomen- soft, non-tender; bowel sounds normal; no masses,  no organomegaly Extremities- no edema Musculoskeletal-complains of right hip pain and chest pain to palpation Skin-warm and dry, no hyperpigmentation, vitiligo, or suspicious lesions  Neurological Examination   Mental Status: Alert, agitated.  At times less than cooperative.  Tangential and difficult to keep on task.  Speech fluent without evidence of aphasia.  Able to follow 3 step commands without significant reinforcement required. Cranial Nerves: II: Visual fields grossly normal III,IV, VI: ptosis not present, extra-ocular motions intact bilaterally V,VII: smile symmetric, facial light touch sensation normal bilaterally VIII: hearing normal bilaterally XI: decreased left shoulder shrug XII: midline tongue extension Motor: Right : Upper extremity   5/5    Left:      Upper extremity   5/5  Lower extremity   5/5     Lower extremity   5/5 Tone and bulk:normal tone throughout; no atrophy noted Sensory: Pinprick and light touch intact throughout, bilaterally Deep Tendon Reflexes: Symmetric throughout Plantars: Right: downgoing   Left: downgoing Cerebellar: normal finger-to-nose and normal heel-to-shin testing bilaterally Gait: not tested due to safety concerns    Laboratory Studies:   Basic Metabolic Panel: Recent Labs  Lab 07/25/24 1348 07/26/24 0433  NA 136 141  K 4.3 3.6  CL 99 105  CO2 25 26  GLUCOSE 290* 263*  BUN 12 18  CREATININE 0.80 0.89  CALCIUM  9.4 9.0  MG  --  2.1  PHOS  --  3.6    Liver Function Tests: Recent Labs  Lab 07/25/24 1348  AST 16  ALT 13  ALKPHOS 82  BILITOT 0.5  PROT 6.8  ALBUMIN  3.7   No results for input(s): LIPASE, AMYLASE in the last 168 hours. No results for input(s): AMMONIA in the last 168 hours.  CBC: Recent Labs  Lab 07/25/24 1348 07/26/24 0433  WBC 6.9 7.7  HGB 13.8 12.6  HCT 42.9 38.5  MCV 83.1 82.8  PLT 210 190    Cardiac Enzymes: No results for input(s): CKTOTAL, CKMB, CKMBINDEX, TROPONINI in the last 168 hours.  BNP: Invalid input(s): POCBNP  CBG: Recent Labs  Lab 07/26/24  0848  GLUCAP 257*    Microbiology: Results for orders placed or performed during the hospital encounter of 07/25/24  Resp panel by RT-PCR (RSV, Flu A&B, Covid) Urine, Clean Catch     Status: None   Collection Time: 07/25/24  4:21 PM   Specimen: Urine, Clean Catch; Nasal Swab  Result Value Ref Range Status   SARS Coronavirus 2 by RT PCR NEGATIVE NEGATIVE Final    Comment: (NOTE) SARS-CoV-2 target nucleic acids are NOT DETECTED.  The SARS-CoV-2 RNA is generally detectable in upper respiratory specimens during the acute phase of infection. The lowest concentration of SARS-CoV-2 viral copies this assay can detect is 138 copies/mL. A negative result does not preclude  SARS-Cov-2 infection and should not be used as the sole basis for treatment or other patient management decisions. A negative result may occur with  improper specimen collection/handling, submission of specimen other than nasopharyngeal swab, presence of viral mutation(s) within the areas targeted by this assay, and inadequate number of viral copies(<138 copies/mL). A negative result must be combined with clinical observations, patient history, and epidemiological information. The expected result is Negative.  Fact Sheet for Patients:  bloggercourse.com  Fact Sheet for Healthcare Providers:  seriousbroker.it  This test is no t yet approved or cleared by the [place]  FDA and  has been authorized for detection and/or diagnosis of SARS-CoV-2 by FDA under an Emergency Use Authorization (EUA). This EUA will remain  in effect (meaning this test can be used) for the duration of the COVID-19 declaration under Section 564(b)(1) of the Act, 21 U.S.C.section 360bbb-3(b)(1), unless the authorization is terminated  or revoked sooner.       Influenza A by PCR NEGATIVE NEGATIVE Final   Influenza B by PCR NEGATIVE NEGATIVE Final    Comment: (NOTE) The Xpert Xpress SARS-CoV-2/FLU/RSV plus assay is intended as an aid in the diagnosis of influenza from Nasopharyngeal swab specimens and should not be used as a sole basis for treatment. Nasal washings and aspirates are unacceptable for Xpert Xpress SARS-CoV-2/FLU/RSV testing.  Fact Sheet for Patients: bloggercourse.com  Fact Sheet for Healthcare Providers: seriousbroker.it  This test is not yet approved or cleared by the [place]  FDA and has been authorized for detection and/or diagnosis of SARS-CoV-2 by FDA under an Emergency Use Authorization (EUA). This EUA will remain in effect (meaning this test can be used) for the duration of  the COVID-19 declaration under Section 564(b)(1) of the Act, 21 U.S.C. section 360bbb-3(b)(1), unless the authorization is terminated or revoked.     Resp Syncytial Virus by PCR NEGATIVE NEGATIVE Final    Comment: (NOTE) Fact Sheet for Patients: bloggercourse.com  Fact Sheet for Healthcare Providers: seriousbroker.it  This test is not yet approved or cleared by the [place]  FDA and has been authorized for detection and/or diagnosis of SARS-CoV-2 by FDA under an Emergency Use Authorization (EUA). This EUA will remain in effect (meaning this test can be used) for the duration of the COVID-19 declaration under Section 564(b)(1) of the Act, 21 U.S.C. section 360bbb-3(b)(1), unless the authorization is terminated or revoked.  Performed at [facility], [address] [place], [place] [address]    *Note: Due to a large number of results and/or encounters for the requested time period, some results have not been displayed. A complete set of results can be found in Results Review.    Coagulation Studies: No results for input(s): LABPROT, INR in the last 72 hours.  Urinalysis:  Recent Labs  Lab 07/25/24  1707  COLORURINE STRAW*  LABSPEC 1.003*  PHURINE 6.0  GLUCOSEU 50*  HGBUR NEGATIVE  BILIRUBINUR NEGATIVE  KETONESUR NEGATIVE  PROTEINUR NEGATIVE  NITRITE NEGATIVE  LEUKOCYTESUR NEGATIVE    Lipid Panel:     Component Value Date/Time   CHOL 262 (H) 07/26/2024 0433   CHOL 236 01/11/2016 0000   TRIG 334 (H) 07/26/2024 0433   TRIG 175 01/11/2016 0000   HDL 37 (L) 07/26/2024 0433   HDL 45 01/29/2015 0000   CHOLHDL 7.1 07/26/2024 0433   VLDL 67 (H) 07/26/2024 0433   LDLCALC 158 (H) 07/26/2024 0433   LDLCALC 160 01/11/2016 0000    HgbA1C:  Lab Results  Component Value Date   HGBA1C 10.7 (H) 07/25/2024    Urine Drug Screen:  No results found for: LABOPIA, COCAINSCRNUR, LABBENZ, AMPHETMU,  THCU, LABBARB  Alcohol Level: No results for input(s): ETH in the last 168 hours.   Imaging: MR BRAIN WO CONTRAST Result Date: 07/25/2024 CLINICAL DATA:  Initial evaluation for acute TIA. EXAM: MRI HEAD WITHOUT CONTRAST TECHNIQUE: Multiplanar, multiecho pulse sequences of the brain and surrounding structures were obtained without intravenous contrast. COMPARISON:  CT from earlier the same day. FINDINGS: Brain: Mild age-related cerebral atrophy with chronic small vessel ischemic disease. No acute or subacute infarct. No areas of chronic cortical infarction. No acute or significant chronic intracranial blood products. No mass lesion, midline shift or mass effect. No hydrocephalus or extra-axial fluid collection. Pituitary gland within normal limits. Vascular: Major intracranial vascular flow voids are maintained. Skull and upper cervical spine: Craniocervical junction within normal limits. Bone marrow signal intensity normal. No scalp soft tissue abnormality. Sinuses/Orbits: Prior bilateral ocular lens replacement. Small left maxillary sinus retention cyst noted. Paranasal sinuses are otherwise largely clear. No significant mastoid effusion. Other: None. IMPRESSION: 1. No acute intracranial abnormality. 2. Mild age-related cerebral atrophy with chronic small vessel ischemic disease. Electronically Signed   By: Morene Hoard M.D.   On: 07/25/2024 23:08   DG Abd 1 View Result Date: 07/25/2024 CLINICAL DATA:  Abdominal pain. EXAM: ABDOMEN - 1 VIEW COMPARISON:  Radiograph dated 01/15/2020 FINDINGS: No bowel dilatation or evidence of obstruction. Moderate stool throughout the colon. No free air or may calculi. No acute osseous pathology. Right hip arthroplasty. IMPRESSION: Moderate colonic stool burden. No bowel obstruction. Electronically Signed   By: Vanetta Chou M.D.   On: 07/25/2024 20:51   CT HEAD WO CONTRAST ( ) Result Date: 07/25/2024 EXAM: CT HEAD WITHOUT CONTRAST 07/25/2024  05:04:22 PM TECHNIQUE: CT of the head was performed without the administration of intravenous contrast. Automated exposure control, iterative reconstruction, and/or weight based adjustment of the mA/kV was utilized to reduce the radiation dose to as low as reasonably achievable. COMPARISON: 11/30/2023 CLINICAL HISTORY: Headache, new onset (Age >= 51y). FINDINGS: BRAIN AND VENTRICLES: No acute hemorrhage. No evidence of acute infarct. Proportional prominence of the ventricles and sulci, consistent with diffuse cerebral parenchymal volume loss. Scattered periventricular white matter hypoattenuation, most consistent with mild chronic ischemic microvascular disease. No extra-axial collection. No mass effect or midline shift. ORBITS: No acute abnormality. SINUSES: No acute abnormality. SOFT TISSUES AND SKULL: No acute soft tissue abnormality. No skull fracture. Calcified atherosclerotic plaque within cavernous/supraclinoid internal carotid arteries. IMPRESSION: 1. No acute intracranial abnormality. 2. Stable atrophy and chronic small vessel ischemia. Electronically signed by: Andrea Gasman MD 07/25/2024 05:16 PM EDT RP Workstation: [id number]   DG Chest 2 View Result Date: 07/25/2024 CLINICAL DATA:  Chest pain. EXAM: CHEST - 2 VIEW COMPARISON:  Chest radiograph dated 04/05/2024. FINDINGS: No focal consolidation, pleural effusion, or pneumothorax. The cardiac silhouette is within limits. No acute osseous pathology. IMPRESSION: No active cardiopulmonary disease. Electronically Signed   By: Vanetta Chou M.D.   On: 07/25/2024 14:48     Assessment/Plan: 83 y.o. female  with past medical history of type 1 diabetes, diastolic CHF, carotid artery stenosis s/p stent, hypothyroid, dementia, fibromyalgia, systemic sclerosis, Sjogren's who presented  complaining of shortness of breath and chest pain that started around 8 AM.  Patient is more worried about headache, nausea and difficulty ambulation which started around  5 AM today.  Patient is not a very good historian.  Slightly agitated.  Does not speak of chest pain today but reports that she had an episode where she lost vision completely lasting a few minutes, then resolved spontaneously.  She reports that this has happened before.   Per chart, one week ago she had an episode where she felt someone was trying to break into her house and she called the police and notified her son.  As per patient's son there was no such incident happened at her house.   Followed by neurology on an outpatient basis.  Per notes episodes of vision loss felt to be presyncopal.  Work up in the past has been unremarkable.  Has also been having cognitive issues (getting lost while driving, MMSE score of 77/69, etc).  Unclear if patient should still be living alone.   MRI of the brain personally reviewed and shows no acute changes.  LDL 158. Echocardiogram pending  Recommendations: Continue Plavix  Continue Aricept  Would start high intensity statin Telemetry Frequent neuro checks Would f/u with neurology on an outpatient basis.  Sees Dr. Lane.    Sonny Hock, MD Neurology  07/26/2024, 11:00 AM

## 2024-07-26 NOTE — Discharge Instructions (Signed)
 SABRA

## 2024-07-26 NOTE — Progress Notes (Signed)
 SLP Cancellation Note  Patient Details Name: Cassandra Benson MRN: [id number] DOB: 05/02/1941   Cancelled treatment:       Reason Eval/Treat Not Completed: SLP screened, no needs identified, will sign off   Pt admitted for report of chest pains with no acute neurological findings on imaging and no acute s/s of cognitive deficits. Per chart, at baseline, pt has dx of dementia with MCI (Mild Cognitive Impairment) and memory loss first dx'ed on 10/22/2019. Pt followed by outpatient neurological with recent MMSE of 22/30 (12/2023). Per chart, one week ago she had an episode where she felt someone was trying to break into her house and she called the police and notified her son.  As per patient's son there was no such incident happened at her house.     Dub Maclellan 07/26/2024, 12:10 PM

## 2024-07-26 NOTE — TOC Transition Note (Signed)
 Transition of Care [facility]) - Discharge Note   Patient Details  Name: Cassandra Benson MRN: [id number] Date of Birth: July 21, 1941  Transition of Care [facility]) CM/SW Contact:  Daved JONETTA Hamilton, RN Phone Number: 07/26/2024, 3:24 PM   Clinical Narrative:     Patient will DC to: Home with Home Health Anticipated DC date: 07/26/2024 Family notified: Donnice and Alan Blumenthal Transport by: Donnice and Alan Blumenthal  Per MD patient ready for DC to . RN, patient, patient's family, and facility notified of DC.    TOC signing off.   Final next level of care: Home w Home Health Services Barriers to Discharge: Barriers Resolved   Patient Goals and CMS Choice     Choice offered to / list presented to : Adult Children      Discharge Placement                    Patient and family notified of of transfer: 07/26/24  Discharge Plan and Services Additional resources added to the After Visit Summary for     Discharge Planning Services: CM Consult                      HH Arranged: PT [facility] Agency: [facility] Date [facility] Agency Contacted: 07/26/24 Time HH Agency Contacted: [phone] Representative spoke with at [facility] Agency: Larraine  Social Drivers of Health (SDOH) Interventions SDOH Screenings   Food Insecurity: No Food Insecurity (03/27/2024)  Housing: Unknown (03/27/2024)  Transportation Needs: No Transportation Needs (03/27/2024)  Utilities: Not At Risk (03/27/2024)  Alcohol Screen: Low Risk  (09/23/2021)  Depression (PHQ2-9): Low Risk  (06/19/2024)  Recent Concern: Depression (PHQ2-9) - Medium Risk (04/16/2024)  Financial Resource Strain: Medium Risk (03/26/2024)  Physical Activity: Insufficiently Active (10/31/2023)  Social Connections: Moderately Integrated (03/08/2024)  Stress: No Stress Concern Present (10/31/2023)  Tobacco Use: Low Risk  (07/25/2024)  Health Literacy: Adequate Health Literacy (11/07/2023)     Readmission Risk Interventions     No data to display

## 2024-07-26 NOTE — Plan of Care (Signed)
  Problem: Coping: Goal: Ability to adjust to condition or change in health will improve Outcome: Progressing   Problem: Health Behavior/Discharge Planning: Goal: Ability to manage health-related needs will improve Outcome: Progressing   Problem: Nutritional: Goal: Maintenance of adequate nutrition will improve Outcome: Progressing   Problem: Tissue Perfusion: Goal: Adequacy of tissue perfusion will improve Outcome: Progressing   

## 2024-07-26 NOTE — Evaluation (Signed)
 Physical Therapy Evaluation Patient Details Name: Cassandra Benson MRN: [id number] DOB: 02-09-1941 Today's Date: 07/26/2024  History of Present Illness  Pt is an 83 y.o. female with Past medical history of type 1 diabetes, diastolic CHF, carotid artery stenosis who presents stent, hypothyroid, dementia, fibromyalgia, systemic sclerosis, Sjogren's, as reviewed from EMR, presented at [facility] ED with complaining of shortness of breath and chest pain.  MD assessment includes: AMS r/o TIA vs seizure vs worsening of dementia with delusions and hallucinations, right sided Headache possible migraine, chest wall pain, musculoskeletal most likely due to fibromyalgia, and dementia with delusions and hallucinations.   Clinical Impression  Pt with somewhat easily agitated by certain requests and/or commands but ultimately was able to follow all 1-step commands with some increased time and cuing and put forth good effort during the session.  Pt required increased time and effort to come to sitting from supine as well as to come to standing from the EOB with multiple rocking attempts but required no physical assist.  Pt initially ambulated with a RW and required some cuing for proper use but was steady with no overt LOB.  Pt then ambulated without an AD which is her stated baseline and presented with noted decrease in stability including drifting and reaching at one point for UE support from the wall rail.  Pt will benefit from continued PT services upon discharge to safely address deficits listed in patient problem list for decreased caregiver assistance and eventual return to PLOF.          If plan is discharge home, recommend the following: A little help with walking and/or transfers;A little help with bathing/dressing/bathroom;Supervision due to cognitive status;Assist for transportation;Help with stairs or ramp for entrance;Assistance with cooking/housework;Direct supervision/assist for medications management    Can travel by private vehicle        Equipment Recommendations None recommended by PT  Recommendations for Other Services       Functional Status Assessment Patient has had a recent decline in their functional status and demonstrates the ability to make significant improvements in function in a reasonable and predictable amount of time.     Precautions / Restrictions Precautions Precautions: Fall Restrictions Weight Bearing Restrictions Per Provider Order: No      Mobility  Bed Mobility Overal bed mobility: Modified Independent             General bed mobility comments: Min extra time and effort only    Transfers Overall transfer level: Needs assistance Equipment used: Rolling walker (2 wheels), None               General transfer comment: Fair eccentric and concentric control with multiple rocking attempts needed to come to standing but no physical assist or UE assist needed    Ambulation/Gait Ambulation/Gait assistance: Contact guard assist Gait Distance (Feet): 50 Feet x 1 with RW, 150 Feet x 1 without an AD Assistive device: Rolling walker (2 wheels), None Gait Pattern/deviations: Step-through pattern, Decreased step length - right, Decreased step length - left, Drifts right/left Gait velocity: decreased     General Gait Details: Mildly reduced cadence with drifting left/right and once instance of reaching out to the wall rail for support without use of AD; improved stability with RW but some difficulty sequencing proper use  Stairs            Wheelchair Mobility     Tilt Bed    Modified Rankin (Stroke Patients Only)       Balance  Overall balance assessment: Needs assistance   Sitting balance-Leahy Scale: Good     Standing balance support: Bilateral upper extremity supported, No upper extremity supported Standing balance-Leahy Scale: Fair                               Pertinent Vitals/Pain Pain Assessment Pain  Assessment: 0-10 Pain Score:  (pt stated 50/10 but no apparent distress during the session) Pain Location: RLE, chronic per patient Pain Intervention(s): Monitored during session, Repositioned, Other (comment) (Spoke to patient about contacting nsg for pain meds with pt declining)    Home Living Family/patient expects to be discharged to:: Private residence Living Arrangements: Alone Available Help at Discharge: Family;Available PRN/intermittently Type of Home: House Home Access: Stairs to enter Entrance Stairs-Rails: None Entrance Stairs-Number of Steps: 1 small step   Home Layout: One level Home Equipment: [employer]ant (2 wheels);Cane - single point;Grab bars - tub/shower      Prior Function               Mobility Comments: Ind amb without AD community distances, uses a SPC PRN and a RW when not feeling well, no fall history ADLs Comments: Ind with ADLs     Extremity/Trunk Assessment   Upper Extremity Assessment Upper Extremity Assessment: Defer to OT evaluation    Lower Extremity Assessment Lower Extremity Assessment: Generalized weakness (BLE neuro assessment performed by neuro MD just prior to session with no gross deficits or asymmetries)       Communication   Communication Communication: No apparent difficulties    Cognition Arousal: Alert Behavior During Therapy: WFL for tasks assessed/performed (easily frustrated)   PT - Cognitive impairments: No family/caregiver present to determine baseline                                 Cueing Cueing Techniques: Verbal cues, Tactile cues, Visual cues     General Comments      Exercises     Assessment/Plan    PT Assessment Patient needs continued PT services  PT Problem List Decreased strength;Decreased balance;Decreased activity tolerance;Decreased mobility;Decreased knowledge of use of DME;Decreased safety awareness;Pain       PT Treatment Interventions DME instruction;Stair  training;Functional mobility training;Therapeutic activities;Therapeutic exercise;Balance training;Patient/family education    PT Goals (Current goals can be found in the Care Plan section)  Acute Rehab PT Goals Patient Stated Goal: To get home and be independent PT Goal Formulation: With patient Time For Goal Achievement: 08/08/24 Potential to Achieve Goals: Good    Frequency Min 2X/week     Co-evaluation               AM-PAC PT 6 Clicks Mobility  Outcome Measure Help needed turning from your back to your side while in a flat bed without using bedrails?: None Help needed moving from lying on your back to sitting on the side of a flat bed without using bedrails?: None Help needed moving to and from a bed to a chair (including a wheelchair)?: A Little Help needed standing up from a chair using your arms (e.g., wheelchair or bedside chair)?: A Little Help needed to walk in hospital room?: A Little Help needed climbing 3-5 steps with a railing? : A Little 6 Click Score: 20    End of Session Equipment Utilized During Treatment: Gait belt Activity Tolerance: Patient tolerated treatment well Patient left: in chair;with  call bell/phone within reach;with chair alarm set Nurse Communication: Mobility status;Other (comment) (chair alarm set up and on but not working, nsg and CNA notified) PT Visit Diagnosis: Unsteadiness on feet (R26.81);Difficulty in walking, not elsewhere classified (R26.2);Muscle weakness (generalized) (M62.81);Pain Pain - Right/Left: Right Pain - part of body: Leg (chronic per pt from old THR)    Time: 0920-0949 PT Time Calculation (min) (ACUTE ONLY): 29 min   Charges:   PT Evaluation $PT Eval Moderate Complexity: 1 Mod PT Treatments $Gait Training: 8-22 mins PT General Charges $$ ACUTE PT VISIT: 1 Visit       D. Scott Sivan Quast PT, DPT 07/26/24, 11:38 AM

## 2024-07-26 NOTE — TOC Initial Note (Signed)
 Transition of Care [facility]) - Initial/Assessment Note    Patient Details  Name: Cassandra Benson MRN: [id number] Date of Birth: 1941/03/17  [facility]) CM/SW Contact:    Daved JONETTA Hamilton, RN Phone Number: 07/26/2024, 2:58 PM  Clinical Narrative:                  Spoke with patient via telephone, introduced self and explained my role. Discussed with patient therapy recommendations of HH PT and presented choice. Patient verbalized concern about how much this would cost out of pocket. This CM advised the service will be processed thru her insurance and that I was unable to know the out of pocket cost. CM ask patient who she would like to use for [facility] PT service and patient verbalized frustration that this is the first she has heard of this and that she felt that this hospital admission has not went well. Patient then verbalized she feels as though she does not understand what has even transpired here as she feels no one has presented themselves with title, or with explanation of what she is doing here or what the conclusion is for her medical issue(s). This CM apologized to patient for how she perceives this hospital encounter has been for her. I then advised patient that it appears the attending provider had seen and spoken with her earlier today. The patient reiterated her concerns. This CM advised the patient I would end our call and discuss her concerns with the attending provider.   Spoke with attending. Attending provided this CM with contact information for Gillie Fleites, patients daughter in law and that Alan would be transporting the patient home.   Spoke with patients son Marirose Deveney [facility]) and daughter in law Jeanene Mena via telephone regarding this hospital encounter, discharge recommendations, as well as this CM interaction with patient.   Patients son Donnice is POA and verbalized for this CM to note that he is to be contacted regarding any future decision making for  patient. Alan Blumenthal, Matthews spouse, has also been added in patients contact list. Alan verbalized choice for [facility] and Donnice verbalized agreement. [facility] selected in [place] and [place] with [facility] notified of selection and discharge today. Alan verbalized they will be at the patients room around 3:30pm for discharge review with nurse and patient.    Expected Discharge Plan: Home w Home Health Services Barriers to Discharge: Barriers Resolved   Patient Goals and CMS Choice     Choice offered to / list presented to : Adult Children      Expected Discharge Plan and Services   Discharge Planning Services: CM Consult   Living arrangements for the past 2 months: Single Family Home Expected Discharge Date: 07/26/24                         HH Arranged: PT HH Agency: [facility] Date HH Agency Contacted: 07/26/24 Time HH Agency Contacted: [phone] Representative spoke with at [facility] Agency: Larraine  Prior Living Arrangements/Services Living arrangements for the past 2 months: Single Family Home Lives with:: Self Patient language and need for interpreter reviewed:: Yes Do you feel safe going back to the place where you live?: Yes      Need for Family Participation in Patient Care: Yes (Comment) Care giver support system in place?: Yes (comment)   Criminal Activity/Legal Involvement Pertinent to Current Situation/Hospitalization: No - Comment as needed  Activities of Daily Living  Permission Sought/Granted Permission sought to share information with : Other (comment) (Son Jazmine Heckman is POA) Permission granted to share information with :  (Son Navil Kole is POA)  Share Information with NAME: Laisa Larrick     Permission granted to share info w Relationship: Son and POA  Permission granted to share info w Contact Information: [phone]  Emotional Assessment Appearance:: Appears stated age Attitude/Demeanor/Rapport: Apprehensive Affect (typically  observed): Agitated Orientation: : Oriented to Self, Oriented to Place, Oriented to  Time, Oriented to Situation Alcohol / Substance Use: Not Applicable Psych Involvement: No (comment)  Admission diagnosis:  Confusion [R41.0] Stroke-like symptoms [R29.90] Nonintractable headache, unspecified chronicity pattern, unspecified headache type [R51.9] Patient Active Problem List   Diagnosis Date Noted   Stroke-like symptoms 07/25/2024   Right thyroid  nodule 06/21/2024   Diabetic peripheral neuropathy (HCC) 05/17/2024   Rhomboid pain 05/02/2024   Coronary artery disease 04/22/2024   Costochondritis 03/09/2024   Polypharmacy 01/30/2024   Syncope 11/30/2023   MCI (mild cognitive impairment) with memory loss 10/09/2023   Asthma 10/09/2023   Vision loss, bilateral 02/03/2023   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   Chronic diastolic heart failure (HCC) 12/02/2022   Allergy to beta blocker 08/06/2022   Steroid-induced hyperglycemia 05/16/2022   Insomnia 02/01/2022   Osteopenia 01/15/2022   Chronic dyspnea 12/22/2021   Unsteadiness 02/13/2021   Atherosclerosis of aorta 12/16/2020   Grade II diastolic dysfunction 08/01/2020   Chronic venous insufficiency 04/07/2020   Partial small bowel obstruction (HCC) 01/15/2020   Epistaxis 01/13/2020   Renal insufficiency 09/14/2019   Chronic midline thoracic back pain 05/14/2019   Sjogren's syndrome without extraglandular involvement 02/26/2019   Epigastric discomfort 12/17/2018   PAD (peripheral artery disease) 10/04/2018   Raynaud disease 09/04/2018   Amaurosis fugax, both eyes 06/18/2018   TIA (transient ischemic attack) 05/21/2018   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   Facial paresthesia 02/07/2018   Pain and swelling of lower leg 12/25/2017   Fibromyalgia    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), bilateral 08/21/2017   Chronic right-sided headache 03/13/2017   Numbness and tingling of both lower extremities 03/13/2017   6th nerve  palsy, 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   Fatty liver 02/13/2017   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) 10/03/2016   Medicare annual wellness visit, subsequent 06/19/2015   Encounter for general adult medical examination with abnormal findings 06/19/2015   Advanced care planning/counseling discussion 06/19/2015   Carotid stenosis, 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 06/19/2015   Vitamin D  deficiency    Family history of premature CAD 06/02/2015   Chronic chest pain 05/26/2015   Elevated testosterone  level in female 09/04/2014   Hyperlipidemia associated with type 2 diabetes mellitus (HCC) 07/30/2014   Essential hypertension 07/17/2014   Hypothyroidism due to Hashimoto's thyroiditis 07/17/2014   OSA (obstructive sleep apnea) 08/22/2012   Chronic low back pain 02/27/2012   Positive ANA (antinuclear antibody) 01/06/2012   PCP:  Rilla Baller, MD Pharmacy:   [facility] - [place], [place] - [address] [place] [address] Phone: [phone] Fax: [phone]     Social Drivers of Health (SDOH) Social History: SDOH Screenings   Food Insecurity: No Food Insecurity (03/27/2024)  Housing: Unknown (03/27/2024)  Transportation Needs: No Transportation Needs (03/27/2024)  Utilities: Not At Risk (03/27/2024)  Alcohol Screen: Low Risk  (09/23/2021)  Depression (PHQ2-9): Low Risk  (06/19/2024)  Recent Concern: Depression (PHQ2-9) - Medium Risk (04/16/2024)  Financial Resource Strain: Medium Risk (03/26/2024)  Physical Activity: Insufficiently Active (  10/31/2023)  Social Connections: Moderately Integrated (03/08/2024)  Stress: No Stress Concern Present (10/31/2023)  Tobacco Use: Low Risk  (07/25/2024)  Health Literacy: Adequate Health Literacy (11/07/2023)   SDOH Interventions:     Readmission Risk Interventions     No data to display

## 2024-07-26 NOTE — Telephone Encounter (Signed)
 Noted. Currently hospitalized with confusion and headache with possible hallucination/delusion, pending neuro and psych eval.

## 2024-07-26 NOTE — Progress Notes (Signed)
 OT Cancellation Note  Patient Details Name: Cassandra Benson MRN: [id number] DOB: [birth year]   Cancelled Treatment:    Reason Eval/Treat Not Completed: Other (comment) (patient agitated, unable to redirect) Upon entry into room, OT introduced herself and patient responded by yelling I don't need OT, I'm retired, no one listens to me! OT tried to explain role of OT, but patient perseverating on course of care and stating I've spoken with the head of [place], [place], to file a complaint. OT notified team that patient is agitated and unable to work with OT at this time, RN present at this time, reporting patients agitation has been waxing/weaning, patient is adamant about leaving today. OT will attempt to eval patient at a later time if patient able.  Maryelizabeth CHRISTELLA Clause 07/26/2024, 12:25 PM

## 2024-07-26 NOTE — Discharge Summary (Signed)
 DISCHARGE SUMMARY    Cassandra Benson FMW:[id number] DOB: Feb 06, 1941 DOA: 07/25/2024  PCP: Rilla Baller, MD  Admit date: 07/25/2024 Discharge date: 07/26/2024   Recommendations for Outpatient Follow-up:  Follow-up with PCP for chronic condition management Follow-up with neurology for ongoing dementia and headache care  Hospital Course: Emberlee Sortino is an 83 year old female with type 1 diabetes, heart failure with preserved EF, carotid artery stenosis status post stent, hypothyroidism, dementia, fibromyalgia, systemic sclerosis, Sjogren syndrome, who presents with with a myriad of symptoms including shortness of breath, chest pain, headache, nausea, difficulty with ambulation.  Per report her story was tangential and difficult to follow.  She reported episodes at home that did not occur per her son.  There is concern of delusion and hallucination. In the ED labs and vitals were grossly normal.  Head CT without acute intracranial abnormality and revealed stable atrophy and chronic small vessel disease.  CXR without cardiopulmonary disease.  She was admitted for further workup.  Altered mental status - Differential is broad. TIA very unlikely given clinical presentation.  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 - Headache completely resolved - On evaluation patient is oriented x 3 but tangential, and adamant she will not participate in further neurologic examinations.  She reports that she is insulted by the neurologic workup she has received thus far. - Neurology was consulted, no further workup needed at this time - Should continue close outpatient follow-up with her established neurologist - Continue Plavix  and statin - Brain MRI: without acute disease - UA WNL - TTE: EF preserved 60 to 65%, grade 1 diastolic dysfunction.  No significant valvular disease - EEG ordered but unlikely  to provide clinical utility.  Okay to discharge without - LDL: 158 - TSH: WNL - Hemoglobin A1c: 10.7% - PT/OT evals   Dementia with delusions and hallucinations - Patient has been experiencing cognitive issues outpatient.  She is no longer driving, finances being managed by family.  She is having difficulty remembering her medications and struggling with her health care management.  Family reports that she is refusing their intervention and assistance.  They remain available will continue to try and help at home.  Unclear if she should truly be living alone. - Recommend close outpatient follow-up with her PCP and neurologist who can better track her progression over time - Continue Aricept  - Psychiatry was consulted  Right-sided headache, resolved - Continue with Tylenol  - Suspect migraine headache.  Status post Imitrex x 1.  Continue following with neurology   Carotid artery stenosis status post stent - Continue Plavix  and statin   Heart failure with reduced EF Chronic chest pain -Patient follows with [facility] cardiology Dr. Gollan outpatient. - Extensive workup for chronic chest pain.  Does not have profound coronary artery disease..  Most recently cardiac cath April 22, 2024: Minimal coronary disease, nonobstructive.  EF preserved. - Cardiac cath March/2024: Nonobstructive disease - Cardiac CTA February 2023: No CAD - Echo March 2025: EF 60% - Echocardiogram again this admission mostly unremarkable, grade 1 diastolic dysfunction. - Patient does follow in heart failure clinic, has upcoming appointment - No change to home meds at this time  Monckeberg sclerosis Sjogren syndrome - Continue outpatient follow-up with specialist team  Type 1 diabetes, uncontrolled - Appears patient is not taking her insulin  consistently.  She is refusing family assistance in this.  She refuses further education on this topic with me this morning - Will  continue home dose insulin  for now.  Have advised  tighter blood glucose control - Hemoglobin A1c 10.7%  Hypothyroidism - Continue Synthroid . TSH WNL   Fibromyalgia - Continue home meds   Hyperlipidemia - Continue statin   On day of discharge I discussed her care directly with the patient and her daughter-in-law Edell Mesenbrink  Discharge Instructions  Discharge Instructions     Call MD for:  difficulty breathing, headache or visual disturbances   Complete by: As directed    Call MD for:  persistant dizziness or light-headedness   Complete by: As directed    Call MD for:  persistant nausea and vomiting   Complete by: As directed    Call MD for:  severe uncontrolled pain   Complete by: As directed    Call MD for:  temperature >100.4   Complete by: As directed    Diet general   Complete by: As directed    Discharge instructions   Complete by: As directed    Please keep all outpatient appointments as scheduled.  Follow-up closely with neurology Dr. Lane to continue to discuss headache and dementia management.  You appear to be on multiple blood pressure medications but your blood pressure during this hospitalization did not require all of these medications.  Please keep a blood pressure log and follow-up with your primary care physician to review the log.  It is possible that you may be able to stop some of your blood pressure medications   Increase activity slowly   Complete by: As directed       Allergies as of 07/26/2024       Reactions   Iodinated Contrast Media Other (See Comments)   Itching (severe) and chest tightness, Rash   Penicillins Anaphylaxis, Swelling, Rash      Amlodipine  Swelling   Pedal edema   Carvedilol  Dermatitis   Facial acne   Codeine Nausea Only   Gabapentin  Other (See Comments)   Gait abnormality   Influenza Vaccines Other (See Comments)   Muscle weakness; unable to walk   Insulin  Aspart (human Analog) (yeast) Other (See Comments)   headache   Nortriptyline  Other (See Comments)   Eye  swelling and facial paralysis   Prednisone     Marked severe hyperglycemia to oral and IM steroids   Valsartan Other (See Comments), Cough   Allergy to generic only, Hacking cough   Zetia  [ezetimibe ] Other (See Comments)   Bad muscle cramps   Anectine [succinylcholine] Rash   Brother went into cardiac arrest.   Erythromycin Rash, Swelling   Sulfa Antibiotics Rash        Medication List     STOP taking these medications    losartan  50 MG tablet Commonly known as: COZAAR    metoprolol  succinate 25 MG 24 hr tablet Commonly known as: TOPROL -XL       TAKE these medications    acetaminophen  500 MG tablet Commonly known as: TYLENOL  Take 500 mg by mouth every 6 (six) hours as needed for mild pain (pain score 1-3) or headache.   albuterol  108 (90 Base) MCG/ACT inhaler Commonly known as: VENTOLIN  HFA Inhale 2 puffs into the lungs every 6 (six) hours as needed.   clopidogrel  75 MG tablet Commonly known as: PLAVIX  TAKE 1 TABLET(75 MG) BY MOUTH EVERY EVENING   donepezil  10 MG tablet Commonly known as: ARICEPT  Take 1 tablet (10 mg total) by mouth at bedtime.   FreeStyle Libre 3 Plus Sensor Misc Change sensor every 15 days. E13.65, Z79.4  FreeStyle Libre 3 Reader [place] Use to check blood sugar continuously. Z79.4. E13.65   furosemide  20 MG tablet Commonly known as: LASIX  Take 1 tablet (20 mg total) by mouth daily.   Glucagon  Emergency 1 MG Kit INJECT into THE muscle ONCE AS NEEDED FOR emergency   insulin  lispro 100 UNIT/ML injection Commonly known as: HUMALOG  Inject 0.07 mLs (7 Units total) into the skin 3 (three) times daily before meals. Dose per sliding scale   isosorbide  mononitrate 30 MG 24 hr tablet Commonly known as: IMDUR  Take 1 tablet (30 mg total) by mouth daily.   Multivitamin Adult Tabs Take 1 tablet by mouth in the morning and at bedtime.   nitroGLYCERIN  0.4 MG SL tablet Commonly known as: Nitrostat  Place 1 tablet (0.4 mg total) under the tongue  every 5 (five) minutes as needed for chest pain.   OneTouch Delica Plus Lancet33G Misc Apply topically.   potassium chloride  10 MEQ tablet Commonly known as: KLOR-CON  Take 1 tablet (10 mEq total) by mouth daily.   PRESERVISION AREDS PO Take 1 tablet by mouth in the morning and at bedtime.   Prodigy Autocode Blood Glucose Devi 1 Device by Does not apply route daily.   Prodigy No Coding Blood Gluc test strip Generic drug: glucose blood Use as instructed   rosuvastatin  20 MG tablet Commonly known as: CRESTOR  Take 1 tablet (20 mg total) by mouth every evening.   Synthroid  75 MCG tablet Generic drug: levothyroxine  TAKE ONE TABLET BY MOUTH Monday-Saturday BEFORE breakfast   Trelegy Ellipta  100-62.5-25 MCG/ACT Aepb Generic drug: Fluticasone-Umeclidin-Vilant Inhale 1 puff into the lungs daily.   Tresiba  FlexTouch 100 UNIT/ML FlexTouch Pen Generic drug: insulin  degludec Inject 60 Units into the skin at bedtime.   vitamin C  100 MG tablet Take 100 mg by mouth daily.   Vitamin D3 25 MCG (1000 UT) Caps Take 2 capsules (2,000 Units total) by mouth daily.        Follow-up Information     Rilla Baller, MD Follow up.   Specialty: Family Medicine Why: hospital follow up Contact information: [address] [place] [address] [phone]                Allergies  Allergen Reactions   Iodinated Contrast Media Other (See Comments)    Itching (severe) and chest tightness, Rash   Penicillins Anaphylaxis, Swelling and Rash        Amlodipine  Swelling    Pedal edema   Carvedilol  Dermatitis    Facial acne   Codeine Nausea Only   Gabapentin  Other (See Comments)    Gait abnormality   Influenza Vaccines Other (See Comments)    Muscle weakness; unable to walk   Insulin  Aspart (Human Analog) (Yeast) Other (See Comments)    headache   Nortriptyline  Other (See Comments)    Eye swelling and facial paralysis   Prednisone      Marked severe hyperglycemia  to oral and IM steroids   Valsartan Other (See Comments) and Cough    Allergy to generic only, Hacking cough   Zetia  [Ezetimibe ] Other (See Comments)    Bad muscle cramps   Anectine [Succinylcholine] Rash    Brother went into cardiac arrest.   Erythromycin Rash and Swelling   Sulfa Antibiotics Rash    Consultations: Treatment Team:  Germaine Raring, MD Donnelly Mellow, MD   Procedures/Studies: ECHOCARDIOGRAM COMPLETE Result Date: 07/26/2024    ECHOCARDIOGRAM REPORT   Patient Name:   EMMELINA MCLOUGHLIN Date of Exam: 07/26/2024 Medical  Rec #:  [id number]              Height:       62.5 in Accession #:    [id number]             Weight:       152.6 lb Date of Birth:  11/15/1940              BSA:          1.714 m Patient Age:    83 years               BP:           95/82 mmHg Patient Gender: F                      HR:           67 bpm. Exam Location:  [facility] Procedure: 2D Echo, Cardiac Doppler, Color Doppler and Intracardiac            Opacification Agent (Both Spectral and Color Flow Doppler were            utilized during procedure). Indications:     Stroke I63.9  History:         Patient has prior history of Echocardiogram examinations, most                  recent 12/01/2023. Stroke.  Sonographer:     Rosina Dunk Referring Phys:  [id number] ELVAN SOR Diagnosing Phys: Redell Cave MD  Sonographer Comments: Technically difficult study due to poor echo windows. IMPRESSIONS  1. Left ventricular ejection fraction, by estimation, is 60 to 65%. The left ventricle has normal function. The left ventricle has no regional wall motion abnormalities. Left ventricular diastolic parameters are consistent with Grade I diastolic dysfunction (impaired relaxation).  2. Right ventricular systolic function is normal. The right ventricular size is normal.  3. The mitral valve is grossly normal. No evidence of mitral valve regurgitation.  4. The aortic valve was not well visualized. Aortic valve  regurgitation is not visualized.  5. The inferior vena cava is normal in size with greater than 50% respiratory variability, suggesting right atrial pressure of 3 mmHg. FINDINGS  Left Ventricle: Left ventricular ejection fraction, by estimation, is 60 to 65%. The left ventricle has normal function. The left ventricle has no regional wall motion abnormalities. Definity contrast agent was given IV to delineate the left ventricular  endocardial borders. The left ventricular internal cavity size was normal in size. There is no left ventricular hypertrophy. Left ventricular diastolic parameters are consistent with Grade I diastolic dysfunction (impaired relaxation). Right Ventricle: The right ventricular size is normal. No increase in right ventricular wall thickness. Right ventricular systolic function is normal. Left Atrium: Left atrial size was normal in size. Right Atrium: Right atrial size was normal in size. Pericardium: There is no evidence of pericardial effusion. Mitral Valve: The mitral valve is grossly normal. No evidence of mitral valve regurgitation. MV peak gradient, 4.1 mmHg. The mean mitral valve gradient is 2.0 mmHg. Tricuspid Valve: The tricuspid valve is normal in structure. Tricuspid valve regurgitation is not demonstrated. Aortic Valve: The aortic valve was not well visualized. Aortic valve regurgitation is not visualized. Aortic valve mean gradient measures 2.0 mmHg. Aortic valve peak gradient measures 3.2 mmHg. Aortic valve area, by VTI measures 1.69 cm. Pulmonic Valve: The pulmonic valve was normal in structure. Pulmonic valve regurgitation is not visualized. Aorta: The aortic  root is normal in size and structure. Venous: The inferior vena cava is normal in size with greater than 50% respiratory variability, suggesting right atrial pressure of 3 mmHg. IAS/Shunts: No atrial level shunt detected by color flow Doppler.  LEFT VENTRICLE PLAX 2D LVIDd:         3.40 cm     Diastology LVIDs:         1.83  cm     LV e' medial:    5.33 cm/s LV PW:         1.07 cm     LV E/e' medial:  13.6 LV IVS:        0.90 cm     LV e' lateral:   4.57 cm/s LVOT diam:     1.70 cm     LV E/e' lateral: 15.8 LV SV:         30 LV SV Index:   18 LVOT Area:     2.27 cm  LV Volumes (MOD) LV vol d, MOD A4C: 48.3 ml LV vol s, MOD A4C: 21.7 ml LV SV MOD A4C:     48.3 ml RIGHT VENTRICLE            IVC RV Basal diam:  3.90 cm    IVC diam: 1.50 cm RV S prime:     8.70 cm/s TAPSE (M-mode): 1.5 cm LEFT ATRIUM             Index LA Vol (A2C):   40.7 ml 23.74 ml/m LA Vol (A4C):   19.7 ml 11.49 ml/m LA Biplane Vol: 28.9 ml 16.86 ml/m  AORTIC VALVE                    PULMONIC VALVE AV Area (Vmax):    1.84 cm     PV Vmax:       0.76 m/s AV Area (Vmean):   1.80 cm     PV Vmean:      51.000 cm/s AV Area (VTI):     1.69 cm     PV VTI:        0.138 m AV Vmax:           89.20 cm/s   PV Peak grad:  2.3 mmHg AV Vmean:          58.900 cm/s  PV Mean grad:  1.0 mmHg AV VTI:            0.180 m AV Peak Grad:      3.2 mmHg AV Mean Grad:      2.0 mmHg LVOT Vmax:         72.40 cm/s LVOT Vmean:        46.700 cm/s LVOT VTI:          0.134 m LVOT/AV VTI ratio: 0.74  AORTA Ao Root diam: 2.70 cm MITRAL VALVE MV Area (PHT): 2.99 cm    SHUNTS MV Area VTI:   1.15 cm    Systemic VTI:  0.13 m MV Peak grad:  4.1 mmHg    Systemic Diam: 1.70 cm MV Mean grad:  2.0 mmHg MV Vmax:       1.01 m/s MV Vmean:      65.2 cm/s MV Decel Time: 254 msec MV E velocity: 72.40 cm/s MV A velocity: 86.50 cm/s MV E/A ratio:  0.84 Redell Cave MD Electronically signed by Redell Cave MD Signature Date/Time: 07/26/2024/12:42:47 PM    Final    MR BRAIN WO CONTRAST Result Date:  07/25/2024 CLINICAL DATA:  Initial evaluation for acute TIA. EXAM: MRI HEAD WITHOUT CONTRAST TECHNIQUE: Multiplanar, multiecho pulse sequences of the brain and surrounding structures were obtained without intravenous contrast. COMPARISON:  CT from earlier the same day. FINDINGS: Brain: Mild age-related  cerebral atrophy with chronic small vessel ischemic disease. No acute or subacute infarct. No areas of chronic cortical infarction. No acute or significant chronic intracranial blood products. No mass lesion, midline shift or mass effect. No hydrocephalus or extra-axial fluid collection. Pituitary gland within normal limits. Vascular: Major intracranial vascular flow voids are maintained. Skull and upper cervical spine: Craniocervical junction within normal limits. Bone marrow signal intensity normal. No scalp soft tissue abnormality. Sinuses/Orbits: Prior bilateral ocular lens replacement. Small left maxillary sinus retention cyst noted. Paranasal sinuses are otherwise largely clear. No significant mastoid effusion. Other: None. IMPRESSION: 1. No acute intracranial abnormality. 2. Mild age-related cerebral atrophy with chronic small vessel ischemic disease. Electronically Signed   By: Morene Hoard M.D.   On: 07/25/2024 23:08   DG Abd 1 View Result Date: 07/25/2024 CLINICAL DATA:  Abdominal pain. EXAM: ABDOMEN - 1 VIEW COMPARISON:  Radiograph dated 01/15/2020 FINDINGS: No bowel dilatation or evidence of obstruction. Moderate stool throughout the colon. No free air or may calculi. No acute osseous pathology. Right hip arthroplasty. IMPRESSION: Moderate colonic stool burden. No bowel obstruction. Electronically Signed   By: Vanetta Chou M.D.   On: 07/25/2024 20:51   CT HEAD WO CONTRAST ( ) Result Date: 07/25/2024 EXAM: CT HEAD WITHOUT CONTRAST 07/25/2024 05:04:22 PM TECHNIQUE: CT of the head was performed without the administration of intravenous contrast. Automated exposure control, iterative reconstruction, and/or weight based adjustment of the mA/kV was utilized to reduce the radiation dose to as low as reasonably achievable. COMPARISON: 11/30/2023 CLINICAL HISTORY: Headache, new onset (Age >= 51y). FINDINGS: BRAIN AND VENTRICLES: No acute hemorrhage. No evidence of acute infarct. Proportional  prominence of the ventricles and sulci, consistent with diffuse cerebral parenchymal volume loss. Scattered periventricular white matter hypoattenuation, most consistent with mild chronic ischemic microvascular disease. No extra-axial collection. No mass effect or midline shift. ORBITS: No acute abnormality. SINUSES: No acute abnormality. SOFT TISSUES AND SKULL: No acute soft tissue abnormality. No skull fracture. Calcified atherosclerotic plaque within cavernous/supraclinoid internal carotid arteries. IMPRESSION: 1. No acute intracranial abnormality. 2. Stable atrophy and chronic small vessel ischemia. Electronically signed by: Andrea Gasman MD 07/25/2024 05:16 PM EDT RP Workstation: [id number]   DG Chest 2 View Result Date: 07/25/2024 CLINICAL DATA:  Chest pain. EXAM: CHEST - 2 VIEW COMPARISON:  Chest radiograph dated 04/05/2024. FINDINGS: No focal consolidation, pleural effusion, or pneumothorax. The cardiac silhouette is within limits. No acute osseous pathology. IMPRESSION: No active cardiopulmonary disease. Electronically Signed   By: Vanetta Chou M.D.   On: 07/25/2024 14:48   US  THYROID  Result Date: 06/28/2024 CLINICAL DATA:  Palpable abnormality. Possible right thyroid  nodule. EXAM: THYROID  ULTRASOUND TECHNIQUE: Ultrasound examination of the thyroid  gland and adjacent soft tissues was performed. COMPARISON:  None Available. FINDINGS: Parenchymal Echotexture: Mildly heterogenous Isthmus: 0.2 cm Right lobe: 1.9 x 0.6 x 0.4 cm Left lobe: 2.4 x 0.7 x 0.6 cm _________________________________________________________ Estimated total number of nodules >/= 1 cm: 0 Number of spongiform nodules >/=  2 cm not described below (TR1): 0 Number of mixed cystic and solid nodules >/= 1.5 cm not described below (TR2): 0 _________________________________________________________ No discrete nodules are seen within the thyroid  gland. No enlarged or abnormal appearing lymph nodes are identified. IMPRESSION: Small and  mildly heterogeneous  thyroid  gland without discrete nodules. Electronically Signed   By: Marcey Moan M.D.   On: 06/28/2024 13:06      Discharge Exam: Vitals:   07/26/24 0411 07/26/24 0844  BP: (!) 151/63 95/82  Pulse: 73 68  Resp: 18 17  Temp: 97.9 F (36.6 C) 97.7 F (36.5 C)  SpO2: 97% 96%   Vitals:   07/25/24 2145 07/25/24 2356 07/26/24 0411 07/26/24 0844  BP: (!) 176/60 (!) 178/53 (!) 151/63 95/82  Pulse: 62 (!) 59 73 68  Resp: 18 16 18 17   Temp: 97.9 F (36.6 C) 98.1 F (36.7 C) 97.9 F (36.6 C) 97.7 F (36.5 C)  TempSrc: Oral  Oral Oral  SpO2: 100% 100% 97% 96%  Weight: 69.2 kg     Height:        Constitutional:  Normal appearance. Non toxic-appearing.  HENT: Head Normocephalic and atraumatic.  Mucous membranes are moist.  Eyes:  Extraocular intact. Conjunctivae normal.  Cardiovascular: Rate and Rhythm: Normal rate and regular rhythm.  Pulmonary: Non labored, symmetric rise of chest wall.  Skin: warm and dry. not jaundiced.  Neurological: Alert, oriented x 4 Psychiatric: Irritable, speech is tangential and difficult to redirect   The results of significant diagnostics from this hospitalization (including imaging, microbiology, ancillary and laboratory) are listed below for reference.     Microbiology: Recent Results (from the past 240 hours)  Resp panel by RT-PCR (RSV, Flu A&B, Covid) Urine, Clean Catch     Status: None   Collection Time: 07/25/24  4:21 PM   Specimen: Urine, Clean Catch; Nasal Swab  Result Value Ref Range Status   SARS Coronavirus 2 by RT PCR NEGATIVE NEGATIVE Final    Comment: (NOTE) SARS-CoV-2 target nucleic acids are NOT DETECTED.  The SARS-CoV-2 RNA is generally detectable in upper respiratory specimens during the acute phase of infection. The lowest concentration of SARS-CoV-2 viral copies this assay can detect is 138 copies/mL. A negative result does not preclude SARS-Cov-2 infection and should not be used as the sole basis  for treatment or other patient management decisions. A negative result may occur with  improper specimen collection/handling, submission of specimen other than nasopharyngeal swab, presence of viral mutation(s) within the areas targeted by this assay, and inadequate number of viral copies(<138 copies/mL). A negative result must be combined with clinical observations, patient history, and epidemiological information. The expected result is Negative.  Fact Sheet for Patients:  bloggercourse.com  Fact Sheet for Healthcare Providers:  seriousbroker.it  This test is no t yet approved or cleared by the [place]  FDA and  has been authorized for detection and/or diagnosis of SARS-CoV-2 by FDA under an Emergency Use Authorization (EUA). This EUA will remain  in effect (meaning this test can be used) for the duration of the COVID-19 declaration under Section 564(b)(1) of the Act, 21 U.S.C.section 360bbb-3(b)(1), unless the authorization is terminated  or revoked sooner.       Influenza A by PCR NEGATIVE NEGATIVE Final   Influenza B by PCR NEGATIVE NEGATIVE Final    Comment: (NOTE) The Xpert Xpress SARS-CoV-2/FLU/RSV plus assay is intended as an aid in the diagnosis of influenza from Nasopharyngeal swab specimens and should not be used as a sole basis for treatment. Nasal washings and aspirates are unacceptable for Xpert Xpress SARS-CoV-2/FLU/RSV testing.  Fact Sheet for Patients: bloggercourse.com  Fact Sheet for Healthcare Providers: seriousbroker.it  This test is not yet approved or cleared by the [place]  FDA and has been authorized for  detection and/or diagnosis of SARS-CoV-2 by FDA under an Emergency Use Authorization (EUA). This EUA will remain in effect (meaning this test can be used) for the duration of the COVID-19 declaration under Section 564(b)(1) of the Act, 21  U.S.C. section 360bbb-3(b)(1), unless the authorization is terminated or revoked.     Resp Syncytial Virus by PCR NEGATIVE NEGATIVE Final    Comment: (NOTE) Fact Sheet for Patients: bloggercourse.com  Fact Sheet for Healthcare Providers: seriousbroker.it  This test is not yet approved or cleared by the [place]  FDA and has been authorized for detection and/or diagnosis of SARS-CoV-2 by FDA under an Emergency Use Authorization (EUA). This EUA will remain in effect (meaning this test can be used) for the duration of the COVID-19 declaration under Section 564(b)(1) of the Act, 21 U.S.C. section 360bbb-3(b)(1), unless the authorization is terminated or revoked.  Performed at [facility], [address] [place], [place] [address]      Labs: BNP (last 3 results) Recent Labs    04/05/24 1228 07/25/24 1348  BNP 88.3 69.3   Basic Metabolic Panel: Recent Labs  Lab 07/25/24 1348 07/26/24 0433  NA 136 141  K 4.3 3.6  CL 99 105  CO2 25 26  GLUCOSE 290* 263*  BUN 12 18  CREATININE 0.80 0.89  CALCIUM  9.4 9.0  MG  --  2.1  PHOS  --  3.6   Liver Function Tests: Recent Labs  Lab 07/25/24 1348  AST 16  ALT 13  ALKPHOS 82  BILITOT 0.5  PROT 6.8  ALBUMIN  3.7   No results for input(s): LIPASE, AMYLASE in the last 168 hours. No results for input(s): AMMONIA in the last 168 hours. CBC: Recent Labs  Lab 07/25/24 1348 07/26/24 0433  WBC 6.9 7.7  HGB 13.8 12.6  HCT 42.9 38.5  MCV 83.1 82.8  PLT 210 190   Cardiac Enzymes: No results for input(s): CKTOTAL, CKMB, CKMBINDEX, TROPONINI in the last 168 hours. BNP: Invalid input(s): POCBNP CBG: Recent Labs  Lab 07/26/24 0848 07/26/24 1213  GLUCAP 257* 162*   D-Dimer No results for input(s): DDIMER in the last 72 hours. Hgb A1c Recent Labs    07/25/24 1808  HGBA1C 10.7*   Lipid Profile Recent Labs    07/26/24 0433  CHOL  262*  HDL 37*  LDLCALC 158*  TRIG 334*  CHOLHDL 7.1   Thyroid  function studies Recent Labs    07/25/24 1621  TSH 2.325   Anemia work up No results for input(s): VITAMINB12, FOLATE, FERRITIN, TIBC, IRON, RETICCTPCT in the last 72 hours. Urinalysis    Component Value Date/Time   COLORURINE STRAW (A) 07/25/2024 1707   APPEARANCEUR CLEAR (A) 07/25/2024 1707   LABSPEC 1.003 (L) 07/25/2024 1707   PHURINE 6.0 07/25/2024 1707   GLUCOSEU 50 (A) 07/25/2024 1707   HGBUR NEGATIVE 07/25/2024 1707   BILIRUBINUR NEGATIVE 07/25/2024 1707   BILIRUBINUR negative 02/25/2022 1553   KETONESUR NEGATIVE 07/25/2024 1707   PROTEINUR NEGATIVE 07/25/2024 1707   UROBILINOGEN 0.2 02/25/2022 1553   NITRITE NEGATIVE 07/25/2024 1707   LEUKOCYTESUR NEGATIVE 07/25/2024 1707   Sepsis Labs Recent Labs  Lab 07/25/24 1348 07/26/24 0433  WBC 6.9 7.7   Microbiology Recent Results (from the past 240 hours)  Resp panel by RT-PCR (RSV, Flu A&B, Covid) Urine, Clean Catch     Status: None   Collection Time: 07/25/24  4:21 PM   Specimen: Urine, Clean Catch; Nasal Swab  Result Value Ref Range Status   SARS  Coronavirus 2 by RT PCR NEGATIVE NEGATIVE Final    Comment: (NOTE) SARS-CoV-2 target nucleic acids are NOT DETECTED.  The SARS-CoV-2 RNA is generally detectable in upper respiratory specimens during the acute phase of infection. The lowest concentration of SARS-CoV-2 viral copies this assay can detect is 138 copies/mL. A negative result does not preclude SARS-Cov-2 infection and should not be used as the sole basis for treatment or other patient management decisions. A negative result may occur with  improper specimen collection/handling, submission of specimen other than nasopharyngeal swab, presence of viral mutation(s) within the areas targeted by this assay, and inadequate number of viral copies(<138 copies/mL). A negative result must be combined with clinical observations, patient  history, and epidemiological information. The expected result is Negative.  Fact Sheet for Patients:  bloggercourse.com  Fact Sheet for Healthcare Providers:  seriousbroker.it  This test is no t yet approved or cleared by the [place]  FDA and  has been authorized for detection and/or diagnosis of SARS-CoV-2 by FDA under an Emergency Use Authorization (EUA). This EUA will remain  in effect (meaning this test can be used) for the duration of the COVID-19 declaration under Section 564(b)(1) of the Act, 21 U.S.C.section 360bbb-3(b)(1), unless the authorization is terminated  or revoked sooner.       Influenza A by PCR NEGATIVE NEGATIVE Final   Influenza B by PCR NEGATIVE NEGATIVE Final    Comment: (NOTE) The Xpert Xpress SARS-CoV-2/FLU/RSV plus assay is intended as an aid in the diagnosis of influenza from Nasopharyngeal swab specimens and should not be used as a sole basis for treatment. Nasal washings and aspirates are unacceptable for Xpert Xpress SARS-CoV-2/FLU/RSV testing.  Fact Sheet for Patients: bloggercourse.com  Fact Sheet for Healthcare Providers: seriousbroker.it  This test is not yet approved or cleared by the [place]  FDA and has been authorized for detection and/or diagnosis of SARS-CoV-2 by FDA under an Emergency Use Authorization (EUA). This EUA will remain in effect (meaning this test can be used) for the duration of the COVID-19 declaration under Section 564(b)(1) of the Act, 21 U.S.C. section 360bbb-3(b)(1), unless the authorization is terminated or revoked.     Resp Syncytial Virus by PCR NEGATIVE NEGATIVE Final    Comment: (NOTE) Fact Sheet for Patients: bloggercourse.com  Fact Sheet for Healthcare Providers: seriousbroker.it  This test is not yet approved or cleared by the [place]  FDA  and has been authorized for detection and/or diagnosis of SARS-CoV-2 by FDA under an Emergency Use Authorization (EUA). This EUA will remain in effect (meaning this test can be used) for the duration of the COVID-19 declaration under Section 564(b)(1) of the Act, 21 U.S.C. section 360bbb-3(b)(1), unless the authorization is terminated or revoked.  Performed at [facility], [address], [place], [place] [address]      Time coordinating discharge: 32 min   SIGNED: Lorane Poland, DO [facility] 07/26/2024, 1:46 PM Pager   If 7PM-7AM, please contact night-coverage

## 2024-07-26 NOTE — Care Management Obs Status (Signed)
 MEDICARE OBSERVATION STATUS NOTIFICATION   Patient Details  Name: Cassandra Benson MRN: [id number] Date of Birth: April 02, 1941   Medicare Observation Status Notification Given:  Chaney BRANDY CHRISTIANE LELON, CMA 07/26/2024, 2:30 PM

## 2024-07-26 NOTE — Inpatient Diabetes Management (Signed)
 Inpatient Diabetes Program Recommendations  AACE/ADA: New Consensus Statement on Inpatient Glycemic Control   Target Ranges:  Prepandial:   less than 140 mg/dL      Peak postprandial:   less than 180 mg/dL (1-2 hours)      Critically ill patients:  140 - 180 mg/dL   Lab Results  Component Value Date   GLUCAP 257 (H) 07/26/2024   HGBA1C 10.7 (H) 07/25/2024    Diabetes history: DM1 Outpatient Diabetes medications:  Tresiba  60 units nightly Humalog  7 units tid meal coverage Current orders for Inpatient glycemic control: Semglee  20 units bid Novolog  0-15 units tid correction  Inpatient Diabetes Program Recommendations:   Please consider: -Change Novolog  correction to 0-9 units q 4 hrs. Since patient has type 1 diabetes -Add Novolog  5 units tid meal coverage if eats 50% meal  Noted patient's A1c has decreased from 12.2 on 06/19/24 to currently 10.7 pm 07/25/24. Patient has had CGM in the past but with currently having delusions and hallucinations would not be appropriate @ this time for a CGM.  Will follow during hospitalization.  Thank you, Elion Hocker E. Juanmanuel Marohl, RN, MSN, CNS, CDCES  Diabetes Coordinator Inpatient Glycemic Control Team Team Pager [phone] (8am-5pm) 07/26/2024 9:04 AM

## 2024-07-26 NOTE — Consult Note (Signed)
 [name] Psychiatric Consult Initial  Patient Name: .Cassandra Benson  MRN: [id number]  DOB: 1940-11-12  Consult Order details:  Orders (From admission, onward)     Start     Ordered   07/25/24 1852  IP CONSULT TO PSYCHIATRY       Ordering Provider: Von Bellis, MD  Provider:  Donnelly Mellow, MD  Question Answer Comment  Location [facility]   Reason for Consult? Please eval for psychosis and decision-making capacity.      07/25/24 1852             Mode of Visit: In person    Psychiatry Consult Evaluation  Service Date: July 26, 2024 LOS:  LOS: 0 days  Chief Complaint I can't believe they sent you to talk to me  Primary Psychiatric Diagnoses    MCI (mild cognitive impairment) with memory loss   Assessment  Cassandra Benson is a 83 y.o. female admitted: Medically  Per medical MD note # Dementia with delusions and hallucinations Continue Aricept  Patient is not on any antidepressant or antipsychotic medication Use Haldol as needed Continue delirium precautions Follow psych consult, please call tomorrow a.m. consult placed. Patient is very adamant, does not want to be seen by psych but I placed a consult anyway, she may change her mind.  On assessment, patient was very upset and offended that psychiatry was consulted.  Patient did converse with this provider, however patient being fixated on doctors not believing me and always thinking I am crazy and often going on tangents about this when asked other assessment questions.  Patient was able to tell myself the correct year, date and name and date of birth.  Patient reported initially coming into the hospital for having episodes of blacking out.  She reported during these episodes she will completely lose vision and have to sit down in her recliner before it passes.  She endorsed frustration that they always say that I am crazy and will not listen to me.  In chart it is  noted that patient was diagnosed with dementia and is seen regularly by outpatient neurology.  Will copy below last visits impression/plan note from Dr. Lane at the [facility].   MPRESSION/PLAN  Cassandra Benson is a 83 y.o. female presenting for evaluation of  HEADACHES/ TRIGEMINAL NEURALGIA/ MEMORY LOSS/ DIZZINESS/ SOB/ NEAR SYNCOPE - Worsening. - Patient here to be evaluated for worsening head pain and memory concerns. Right-sided head pain radiates from right parietal and temporal regions to the bottom of the right cheek. Headaches also occur almost daily. Patient and her family also reports worsening memory concerns. Getting lost while driving. Continues to live alone and manages medications and finances. Taking Aricept  5 mg nightly. - Restart Carbamazepine 100 mg daily for 1 week, then increase to 100 mg twice daily for trigeminal neuralgia.  - Continue taking Aricept  5 mg nightly to slow the progression of memory loss, per other provider.  - Recommend that patient continues to write in a journal about feelings for 5-10 minutes a day to help process emotions and relieve stress and anxiety. - Discussed no driving with mild dementia due to increased rates of accidents and getting lost.  - Reviewed ESR and CRP results from 02/23/2021, were wnl. Has had normal temporal artery biopsy in 2019 which was normal. - Reviewed recent Carotid US  and Head CT from 11/30/2023, results were normal and were not indicative of stroke-like activity.  - Symptoms of memory loss most consistent with  mild dementia. Will continue to monitor at future visits. - Memory score today is 22/30. Will repeat in 6 months. - Could consider increasing Carbamazepine at future visit if worsening or ongoing symptoms.  - Could consider increasing Aricept  to 10 mg nightly at next visit.    It is also noted in chart review that on 10/28 patient reached out to her family medicine doctor and did report symptoms of headache,  shortness of breath and blurred vision.  911 was called by her primary care doctor due to patient not being able to see well enough to dial or call anyone.  There was an incidence in her chart that patient had thoughts that someone was breaking into her house, but son had reported that it did not actually happen.  It seems that patient has been followed for years by neurology for similar symptom presentation including vision changes, headache, and memory loss.  On presentation today, patient adamantly denied suicidal or homicidal ideations.  Patient denied any auditory or visual hallucinations currently.  Patient did not appear to be responding to internal stimuli.  Patient did report having issues with her memory.  She reported that she follows up regularly with her neurologist.  She was also able to tell this provider that she had a complex medical history, which per chart review appears true.  At this time, patient is not a candidate for inpatient psychiatric admission due to her diagnosed dementia.  Patient would likely not benefit from inpatient psychiatric admission due to her symptoms most likely being related to possible worsening of dementia.  We recommend that patient continue to be followed by neurology and recommend continued outpatient follow-up with her neurologist once medically discharged.  Patient did not wish to discuss medication options at this time.  She did report she would follow-up with her neurologist and primary care doctor.  She reported trusting her primary care doctor and neurologist but not these people.    Per neurology note today it is noted the recommendations of Followed by neurology on an outpatient basis.  Per notes episodes of vision loss felt to be presyncopal.  Work up in the past has been unremarkable.  Has also been having cognitive issues (getting lost while driving, MMSE score of 77/69, etc).  Unclear if patient should still be living alone.   MRI of the brain  personally reviewed and shows no acute changes.  LDL 158. Echocardiogram pending   Recommendations: Continue Plavix  Continue Aricept  Would start high intensity statin Telemetry Frequent neuro checks Would f/u with neurology on an outpatient basis.  Sees Dr. Lane.    Psychiatry will sign off at this time with recommendations to continue to follow with neurology.  Please reach out if any new needs arise.  Unable to assess patient for capacity at this time due to limited exam being able to be conducted due to patient's frustration with psychiatry being consulted.  It is recommended that neurology be involved for recommendations on this as well as patient will likely get frustrated if psychiatry attempts to continue to round on patient.  Patient with thought process of They think I am crazy when this provider attempted to ask assessment questions related to capacity.  For example, when provider asked what she was holding in her hand to determine patient's object awareness, patient stated see this is what is really offensive, they think I am stupid, its a pen.  Provider attempted to explain that these are questions that we asked everybody, but patient continued  to be fixated on how offended she was and how stupid this was.  Patient also voiced frustration that she came here for medical reasons and ended up having psychiatry consulted.  This provider did not continue to push conversation with patient as patient appeared to become more agitated the more it was discussed.  Provider was able to get answers to some assessment questions by having casual conversation with patient and nonchalantly throwing in questions.  Patient did answer questions of suicidality and homicidality to which she denied both.  She also denied any previous self-harm or suicide attempts.  And as stated above, she denied any current auditory or visual hallucinations. Diagnoses:  Active Hospital problems: Principal Problem:    Stroke-like symptoms Active Problems:   MCI (mild cognitive impairment) with memory loss    Plan   ## Psychiatric Medication Recommendations:  Patient declined  ## Medical Decision Making Capacity: Not specifically addressed in this encounter  ## Further Work-up:   -- most recent EKG on 07/25/2024 had QtC of 427 -- Pertinent labwork reviewed earlier this admission includes: bmp, cbc, MRI   ## Disposition:-- There are no psychiatric contraindications to discharge at this time  ## Behavioral / Environmental: -Utilize compassion and acknowledge the patient's experiences while setting clear and realistic expectations for care.    ## Safety and Observation Level:  - Based on my clinical evaluation, I estimate the patient to be at low risk of self harm in the current setting. - At this time, we recommend  routine. This decision is based on my review of the chart including patient's history and current presentation, interview of the patient, mental status examination, and consideration of suicide risk including evaluating suicidal ideation, plan, intent, suicidal or self-harm behaviors, risk factors, and protective factors. This judgment is based on our ability to directly address suicide risk, implement suicide prevention strategies, and develop a safety plan while the patient is in the clinical setting. Please contact our team if there is a concern that risk level has changed.  CSSR Risk Category:C-SSRS RISK CATEGORY: No Risk  Suicide Risk Assessment: Patient has following modifiable risk factors for suicide: triggering events, which we are addressing by utilizing therapeutic communication and offering patient safe supportive environment to discuss concerns. Patient has following non-modifiable or demographic risk factors for suicide: N/A Patient has the following protective factors against suicide: Supportive family, no history of suicide attempts, and no history of NSSIB  Thank you for  this consult request. Recommendations have been communicated to the primary team.  We will sign off at this time.   Zelda Sharps, NP        History of Present Illness  Relevant Aspects of [facility]   Patient Report:  On assessment, patient was very upset and offended that psychiatry was consulted.  Patient did converse with this provider, however patient being fixated on doctors not believing me and always thinking I am crazy and often going on tangents about this when asked other assessment questions.  Patient was able to tell myself the correct year, date and name and date of birth.  Patient reported initially coming into the hospital for having episodes of blacking out.  She reported during these episodes she will completely lose vision and have to sit down in her recliner before it passes.  She endorsed frustration that they always say that I am crazy and will not listen to me.  In chart it is noted that patient was diagnosed with dementia and is seen  regularly by outpatient neurology.  Will copy below last visits impression/plan note from Dr. Lane at the [place] [facility].   MPRESSION/PLAN  Ms. Schirm is a 83 y.o. female presenting for evaluation of  HEADACHES/ TRIGEMINAL NEURALGIA/ MEMORY LOSS/ DIZZINESS/ SOB/ NEAR SYNCOPE - Worsening. - Patient here to be evaluated for worsening head pain and memory concerns. Right-sided head pain radiates from right parietal and temporal regions to the bottom of the right cheek. Headaches also occur almost daily. Patient and her family also reports worsening memory concerns. Getting lost while driving. Continues to live alone and manages medications and finances. Taking Aricept  5 mg nightly. - Restart Carbamazepine 100 mg daily for 1 week, then increase to 100 mg twice daily for trigeminal neuralgia.  - Continue taking Aricept  5 mg nightly to slow the progression of memory loss, per other provider.  - Recommend that patient continues to  write in a journal about feelings for 5-10 minutes a day to help process emotions and relieve stress and anxiety. - Discussed no driving with mild dementia due to increased rates of accidents and getting lost.  - Reviewed ESR and CRP results from 02/23/2021, were wnl. Has had normal temporal artery biopsy in 2019 which was normal. - Reviewed recent Carotid US  and Head CT from 11/30/2023, results were normal and were not indicative of stroke-like activity.  - Symptoms of memory loss most consistent with mild dementia. Will continue to monitor at future visits. - Memory score today is 22/30. Will repeat in 6 months. - Could consider increasing Carbamazepine at future visit if worsening or ongoing symptoms.  - Could consider increasing Aricept  to 10 mg nightly at next visit.    It is also noted in chart review that on 10/28 patient reached out to her family medicine doctor and did report symptoms of headache, shortness of breath and blurred vision.  911 was called by her primary care doctor due to patient not being able to see well enough to dial or call anyone.  There was an incidence in her chart that patient had thoughts that someone was breaking into her house, but son had reported that it did not actually happen.  It seems that patient has been followed for years by neurology for similar symptom presentation including vision changes, headache, and memory loss.  On presentation today, patient adamantly denied suicidal or homicidal ideations.  Patient denied any auditory or visual hallucinations currently.  Patient did not appear to be responding to internal stimuli.  Patient did report having issues with her memory.  She reported that she follows up regularly with her neurologist.  She was also able to tell this provider that she had a complex medical history, which per chart review appears true.  At this time, patient is not a candidate for inpatient psychiatric admission due to her diagnosed dementia.   Patient would likely not benefit from inpatient psychiatric admission due to her symptoms most likely being related to possible worsening of dementia.  We recommend that patient continue to be followed by neurology and recommend continued outpatient follow-up with her neurologist once medically discharged.  Patient did not wish to discuss medication options at this time.  She did report she would follow-up with her neurologist and primary care doctor.  She reported trusting her primary care doctor and neurologist but not these people.    Per neurology note today it is noted the recommendations of Followed by neurology on an outpatient basis.  Per notes episodes of vision loss felt  to be presyncopal.  Work up in the past has been unremarkable.  Has also been having cognitive issues (getting lost while driving, MMSE score of 77/69, etc).  Unclear if patient should still be living alone.   MRI of the brain personally reviewed and shows no acute changes.  LDL 158. Echocardiogram pending   Recommendations: Continue Plavix  Continue Aricept  Would start high intensity statin Telemetry Frequent neuro checks Would f/u with neurology on an outpatient basis.  Sees Dr. Lane.    Psychiatry will sign off at this time with recommendations to continue to follow with neurology.  Please reach out if any new needs arise.  Patient did report to this provider that she has a son that lives about 5 minutes away and a daughter that lives in [place] .  She did report that her daughter wanted her to move to [place]  so that her daughter can assist her, but she reported she is not ready to do so yet.  She reported getting along well with her daughter and her son.  She reported they do help her out when she needs it.  She also reported having 5 grandkids.  She was able to tell me the name of her outpatient neurologist as well as her family medicine doctor who she referred to as Dr. KANDICE.  Per chart review, this appears correct.  As  stated above, patient continued to be upset that psychiatry was even consulted and reported they did not even take the time to come tell me to my face in regards to doctors consulting psychiatry.  Patient spent much of discussion today voicing concerns related to this.  Psychiatry attempted to contact both son and daughter and was unsuccessful.  The number listed for patient's son on the chart was called and someone answered however they reported you have the wrong number when this provider asked to speak to [facility].  This provider reached out to nursing staff to see if any other contact numbers were available, no new numbers were provided to this provider.  Psych ROS:  Depression: Denied Anxiety:  Denied Mania (lifetime and current): Denied Psychosis: (lifetime and current): Reported event of patient thinking someone was breaking into her house  Collateral information:  Unable to reach anyone at this time    Psychiatric and Social History  Psychiatric History:  Information collected from Patient/chart review  Prev Dx/Sx: Denied Current Psych Provider: None Home Meds (current): Neurology prescribing aricept   Previous Med Trials: Unsure Therapy: Denied  Prior Psych Hospitalization: Denied  Prior Self Harm: Denied Prior Violence: Denied  Family Psych History: Denied Family Hx suicide: Patient reported nephew that died of suicide when he was 49  Social History:   Educational Hx: Unknown 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: Denied Living Situation: Alone Spiritual Hx: Unknown Access to weapons/lethal means: Denied   Substance History Alcohol: Denied  Tobacco: Denied  Illicit drugs: Denied  Prescription drug abuse: Denied  Rehab hx: Denied   Exam Findings  Physical Exam: Deferred to hospitalist, note reviewed Vital Signs:  Temp:  [97.6 F (36.4 C)-98.1 F (36.7 C)] 97.7 F (36.5 C) (10/31 0844) Pulse Rate:  [55-75] 68 (10/31 0844) Resp:  [14-18] 17 (10/31 0844) BP:  (95-178)/(51-82) 95/82 (10/31 0844) SpO2:  [96 %-100 %] 96 % (10/31 0844) Weight:  [69.2 kg] 69.2 kg (10/30 2145) Blood pressure 95/82, pulse 68, temperature 97.7 F (36.5 C), temperature source Oral, resp. rate 17, height 5' 2.5 (1.588 m), weight 69.2 kg, SpO2 96%. Body mass index is  27.46 kg/m.    Mental Status Exam: General Appearance: Fairly Groomed  Orientation:  Full (Time, Place, and Person)  Memory:  Immediate;   Fair Recent;   Fair Remote;   Fair  Concentration:  Concentration: Fair and Attention Span: Poor  Recall:  Fair  Attention  Poor  Eye Contact:  Good  Speech:  Clear and Coherent  Language:  Good  Volume:  Normal  Mood: Frustrated  Affect:  Congruent  Thought Process:  Coherent  Thought Content:  Rumination and Tangential  Suicidal Thoughts:  No  Homicidal Thoughts:  No  Judgement:  Impaired  Insight:  Fair  Psychomotor Activity:  Normal  Akathisia:  No  Fund of Knowledge:  Fair      Assets:  It Sales Professional Social Support  Cognition:  Impaired,  Mild  ADL's:  Intact  AIMS (if indicated):        Other History   These have been pulled in through the EMR, reviewed, and updated if appropriate.  Family History:  The patient's family history includes Alcohol abuse in her brother; Anuerysm in her sister; Breast cancer in her maternal aunt, maternal aunt, and sister; CAD in her sister; CAD (age of onset: 77) in her brother; CAD (age of onset: 42) in her brother; CAD (age of onset: 48) in her father and mother; COPD in her brother and mother; Depression in her grandchild; Diabetes in her brother, sister, and sister; Gallbladder disease in her maternal grandmother; Yvone' disease in her mother; Hypertension in her maternal grandmother; Lupus in her mother and sister; Rheum arthritis in her mother; Stroke in her brother, maternal grandmother, and sister.  Medical History: Past Medical History:  Diagnosis Date   Acute diverticulitis  04/2021   [facility], CT confirmed   Allergy    ANA positive    positive ANA pattern 1 speckled   Arthritis    Carotid artery occlusion    Carotid stenosis, asymptomatic 06/19/2015   1-39% RICA 40-59% LICA rpt 1 yr (05/2015)    CHF (congestive heart failure) (HCC)    Colon polyps    COVID-19 virus infection 09/14/2021   Dermatomyositis (HCC)    Diabetes mellitus without complication (HCC)    Type 1   Diverticulosis    sigmoid on CT scan 12/2019   Family history of adverse reaction to anesthesia    brothr went into cardiac arrest from anectine   Fibromyalgia    prior PCP   GERD (gastroesophageal reflux disease)    prior PCP   Glaucoma    Narrow angle   History of blood clots    DVT, in 20s, none since   History of chicken pox    History of diverticulitis    History of pericarditis 1986   with hospitalization   History of pneumonia 2014   History of shingles    History of UTI    Hyperlipidemia    Hypertension    Hypothyroidism    Mixed connective tissue disease    Partial small bowel obstruction (HCC) 12/2019   managed conservatively   Peptic ulcer    Pneumonia    PONV (postoperative nausea and vomiting)    Raynaud's disease without gangrene    Shoulder pain left   h/o RTC tendonitis and adhesive capsulitis   Sigmoid diverticulitis 05/26/2021   Sjogren's syndrome    Sleep apnea    prior PCP - no CPAP for about 10 yrs   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HCC)    Vitamin D   deficiency    prior PCP    Surgical History: Past Surgical History:  Procedure Laterality Date   ABDOMINAL HYSTERECTOMY  1978   fibroids and menorrhagia, ovaries remain   ARTERY BIOPSY Right 04/06/2018   Procedure: BIOPSY TEMPORAL ARTERY RIGHT;  Surgeon: Herminio Miu, MD;  Location: [facility];  Service: ENT;  Laterality: Right;  Diabetic - insulin  pump sleep apnea   CARDIAC CATHETERIZATION  2000   [facility] normal per patient   COLONOSCOPY  10/2011   1 TA, 1 HP, very tortuous colon ([place])    COLONOSCOPY  02/2020   TA, inflammatory polyp, mod diverticulosis, int/ext hemorrhoids ([place]) no rpt recommended    COLONOSCOPY WITH ESOPHAGOGASTRODUODENOSCOPY (EGD)  03/2007   2 ulcers, benign polyp, rpt 5 yrs [facility])   LEFT HEART CATH AND CORONARY ANGIOGRAPHY Left 04/22/2024   Procedure: LEFT HEART CATH AND CORONARY ANGIOGRAPHY;  Surgeon: Darron Deatrice LABOR, MD;  Location: [facility] INVASIVE CV LAB;  Service: Cardiovascular;  Laterality: Left;   PARTIAL HIP ARTHROPLASTY Right 2013   hip replacement   RIGHT HEART CATH AND CORONARY ANGIOGRAPHY Bilateral 12/02/2022   Procedure: RIGHT HEART CATH AND CORONARY ANGIOGRAPHY;  Surgeon: Mady Bruckner, MD;  Location: [facility] INVASIVE CV LAB;  Service: Cardiovascular;  Laterality: Bilateral;   TONSILLECTOMY     TONSILLECTOMY AND ADENOIDECTOMY     TRANSCAROTID ARTERY REVASCULARIZATION  Left 07/23/2018   Procedure: TRANSCAROTID ARTERY REVASCULARIZATION;  Surgeon: Gretta Bruckner PARAS, MD;  Location: [facility] OR;  Service: Vascular;  Laterality: Left;   TUBAL LIGATION     VAGINAL DELIVERY     x2, no complications     Medications:   Current Facility-Administered Medications:    0.9 %  sodium chloride  infusion, , Intravenous, Continuous, Von Bellis, MD, Stopping previously hung infusion at 07/26/24 1356   acetaminophen  (TYLENOL ) tablet 650 mg, 650 mg, Oral, Q4H PRN **OR** acetaminophen  (TYLENOL ) 160 MG/5ML solution 650 mg, 650 mg, Per Tube, Q4H PRN **OR** acetaminophen  (TYLENOL ) suppository 650 mg, 650 mg, Rectal, Q4H PRN, Von Bellis, MD   albuterol  (PROVENTIL ) (2.5 MG/3ML) 0.083% nebulizer solution 3 mL, 3 mL, Nebulization, Q6H PRN, Von Bellis, MD   aspirin  EC tablet 81 mg, 81 mg, Oral, Daily, Von Bellis, MD, 81 mg at 07/26/24 9095   budesonide -glycopyrrolate -formoterol  (BREZTRI ) 160-9-4.8 MCG/ACT inhaler 2 puff, 2 puff, Inhalation, BID, Dezii, Alexandra, DO, 2 puff at 07/26/24 1027   clopidogrel  (PLAVIX ) tablet 75 mg, 75 mg,  Oral, Daily, Von Bellis, MD, 75 mg at 07/26/24 9095   donepezil  (ARICEPT ) tablet 10 mg, 10 mg, Oral, QHS, Von Bellis, MD   enoxaparin  (LOVENOX ) injection 40 mg, 40 mg, Subcutaneous, Q24H, Von Bellis, MD, 40 mg at 07/25/24 2312   furosemide  (LASIX ) tablet 20 mg, 20 mg, Oral, Daily, Von Bellis, MD, 20 mg at 07/26/24 9095   haloperidol lactate (HALDOL) injection 2 mg, 2 mg, Intravenous, Q6H PRN, Von Bellis, MD   HYDROmorphone  (DILAUDID ) injection 0.5 mg, 0.5 mg, Intravenous, Q4H PRN, Von Bellis, MD   insulin  aspart (novoLOG ) injection 0-9 Units, 0-9 Units, Subcutaneous, Q4H, Dezii, Alexandra, DO   insulin  aspart (novoLOG ) injection 5 Units, 5 Units, Subcutaneous, TID WC, Dezii, Alexandra, DO   insulin  glargine-yfgn (SEMGLEE ) injection 20 Units, 20 Units, Subcutaneous, BID, Von Bellis, MD, 20 Units at 07/26/24 1027   levothyroxine  (SYNTHROID ) tablet 75 mcg, 75 mcg, Oral, Q0600, Von Bellis, MD, 75 mcg at 07/26/24 0640   nitroGLYCERIN  (NITROSTAT ) SL tablet 0.4 mg, 0.4 mg, Sublingual, Q5 min PRN, Dezii, Alexandra, DO, 0.4  mg at 07/26/24 1206   oxyCODONE (Oxy IR/ROXICODONE) immediate release tablet 5 mg, 5 mg, Oral, Q6H PRN, Von Bellis, MD, 5 mg at 07/26/24 0203   rosuvastatin  (CRESTOR ) tablet 20 mg, 20 mg, Oral, QPM, Von Bellis, MD   senna-docusate (Senokot-S) tablet 1 tablet, 1 tablet, Oral, QHS PRN, Von Bellis, MD  Allergies: Allergies  Allergen Reactions   Iodinated Contrast Media Other (See Comments)    Itching (severe) and chest tightness, Rash   Penicillins Anaphylaxis, Swelling and Rash        Amlodipine  Swelling    Pedal edema   Carvedilol  Dermatitis    Facial acne   Codeine Nausea Only   Gabapentin  Other (See Comments)    Gait abnormality   Influenza Vaccines Other (See Comments)    Muscle weakness; unable to walk   Insulin  Aspart (Human Analog) (Yeast) Other (See Comments)    headache   Nortriptyline  Other (See Comments)    Eye swelling and  facial paralysis   Prednisone      Marked severe hyperglycemia to oral and IM steroids   Valsartan Other (See Comments) and Cough    Allergy to generic only, Hacking cough   Zetia  [Ezetimibe ] Other (See Comments)    Bad muscle cramps   Anectine [Succinylcholine] Rash    Brother went into cardiac arrest.   Erythromycin Rash and Swelling   Sulfa Antibiotics Rash    Zelda Sharps, NP

## 2024-07-30 ENCOUNTER — Telehealth: Payer: Self-pay | Admitting: Family

## 2024-07-30 ENCOUNTER — Telehealth: Payer: Self-pay

## 2024-07-30 NOTE — Telephone Encounter (Signed)
 Copied from CRM [id number]. Topic: General - Other >> Jul 30, 2024  1:15 PM Alfonso HERO wrote: Reason for CRM: patient calling again stating that she is missing a call from the doctor and missed the call but there is nothing showing where the doctor has called her. I asked if she wants to schedule an appointment since she says she really needs to speak to him about something and she said she is not up for setting up an appointment. Can someone please call her to see what's going on?

## 2024-07-30 NOTE — Telephone Encounter (Signed)
 Called to confirm/remind patient of their appointment [facility] on 07/31/24.   Appointment:   [] Confirmed  [x] Left mess   [] No answer/No voice mail  [] VM Full/unable to leave message  [] Phone not in service  Patient reminded to bring all medications and/or complete list.  Confirmed patient has transportation. Gave directions, instructed to utilize valet parking.

## 2024-07-31 ENCOUNTER — Telehealth: Payer: Self-pay | Admitting: Family

## 2024-07-31 ENCOUNTER — Ambulatory Visit: Admitting: Family

## 2024-07-31 ENCOUNTER — Telehealth: Payer: Self-pay

## 2024-07-31 DIAGNOSIS — R569 Unspecified convulsions: Secondary | ICD-10-CM

## 2024-07-31 NOTE — Telephone Encounter (Signed)
 Copied from CRM [id number]. Topic: General - Other >> Jul 31, 2024  3:37 PM Nessti S wrote: Reason for CRM: pt will like to inform pcp of hospital visit. Call back number [phone]

## 2024-07-31 NOTE — Telephone Encounter (Signed)
 Called pt. Unable to leave vm due to vm being full. Pt also missed cardiology appt today. When spoke to pt this morning she was aware of the appt and as headed out the door for the appt.

## 2024-07-31 NOTE — Telephone Encounter (Signed)
 Patient did not show for [facility] appointment on 10/11/23.
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
## 2024-08-01 ENCOUNTER — Other Ambulatory Visit

## 2024-08-01 ENCOUNTER — Telehealth: Payer: Self-pay | Admitting: Family Medicine

## 2024-08-01 NOTE — Telephone Encounter (Signed)
 Copied from CRM [id number]. Topic: General - Other >> Aug 01, 2024  1:45 PM Macario HERO wrote: Reason for CRM: Patient called regarding a missed appointment. Called [facility] and was advised to send by Sherrilyn to send a CRM to Manuelita Kobs, unable to find that name.

## 2024-08-01 NOTE — Telephone Encounter (Signed)
 Copied from CRM [id number]. Topic: General - Other >> Jul 31, 2024  5:04 PM Cassandra Benson wrote: Reason for CRM: Pt returning call for [employer]

## 2024-08-15 ENCOUNTER — Ambulatory Visit: Payer: Self-pay

## 2024-08-15 NOTE — Telephone Encounter (Unsigned)
 Copied from CRM [id number]. Topic: General - Other >> Aug 15, 2024  8:41 AM Burnard DEL wrote: Reason for CRM: Patient is requesting a phone call from provider or medical assistant to discuss her  heart condition that she recently had.
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
## 2024-08-16 NOTE — Telephone Encounter (Unsigned)
 Copied from CRM [id number]. Topic: General - Other >> Aug 15, 2024  2:06 PM Alfonso HERO wrote: Reason for CRM: patient returning nurse call. States she's available and can be called back. >> Aug 15, 2024  3:37 PM Donna BRAVO wrote: Patient returning missed call.

## 2024-08-16 NOTE — Telephone Encounter (Signed)
 Copied from CRM [id number]. Topic: General - Call Back - No Documentation >> Aug 16, 2024  9:04 AM Tonda B wrote: Reason for CRM: pt missed a call from office please call pt back  [phone] (M) [phone] (H)   Called patient she had several questions she would like to go over with Dr. KANDICE about new DX. I have set patient up with office visit on Monday at 2 pm. She will call if any other changes needed.

## 2024-08-16 NOTE — Telephone Encounter (Signed)
 Noted. Will see then.

## 2024-08-19 ENCOUNTER — Telehealth: Payer: Self-pay | Admitting: Family Medicine

## 2024-08-19 ENCOUNTER — Ambulatory Visit: Admitting: Family Medicine

## 2024-08-19 ENCOUNTER — Ambulatory Visit: Payer: Self-pay

## 2024-08-19 NOTE — Telephone Encounter (Signed)
 Copied from CRM #[id number]. Topic: General - Other >> Aug 19, 2024 12:43 PM Eva FALCON wrote: Reason for CRM: Pt is scheduled to Dr. Rilla today at 2pm. She is unable to make it due to losing her car keys. Does not have anyone to bring her. Was wondering if she could change to virtual visit, emotionally not okay, feeling overwhelmed.

## 2024-08-19 NOTE — Telephone Encounter (Signed)
 Copied from CRM #[id number]. Topic: General - Other >> Aug 19, 2024 12:43 PM Eva FALCON wrote: Reason for CRM: Pt is scheduled to Dr. Rilla today at 2pm. She is unable to make it due to losing her car keys. Does not have anyone to bring her. Was wondering if she could change to virtual visit, emotionally not okay, feeling overwhelmed. >> Aug 19, 2024  3:33 PM China J wrote: Patient is calling back because she only wants to speak with Dr. Rilla about a question he has. She said that she does not trust anyone else to speak with and has received a few calls from the clinic.   Please call patient back at [phone]

## 2024-08-19 NOTE — Telephone Encounter (Signed)
 FYI Only or Action Required?: Action required by provider: update on patient condition.  Patient was last seen in primary care on 06/19/2024 by Cassandra Baller, MD.  Called Nurse Triage reporting Shortness of Breath.  Symptoms began chronic.  Interventions attempted: Rest, hydration, or home remedies.  Symptoms are: gradually worsening.  Triage Disposition: See HCP Within 4 Hours (Or PCP Triage)  Patient/caregiver understands and will follow disposition?: Unsure            Copied from CRM #[id number]. Topic: Clinical - Red Word Triage >> Aug 19, 2024  8:30 AM Cassandra Benson wrote: [facility] that prompted transfer to Nurse Triage: SOB, having congestive issues. Reason for Disposition  [1] Longstanding difficulty breathing (e.g., CHF, COPD, emphysema) AND [2] WORSE than normal    [facility] strongly advised dispo, but pt does not have transportation. Triager advised calling EMS for evaluation, but pt declined. Triager offered to call family on pt behalf, and pt declined and prefers to talk to family personally.   Triager will forward encounter for Dr Cassandra 's office to review.   Of note, pt already has appt scheduled with PCP today-- Pt states I will try and get there sometime today.  Answer Assessment - Initial Assessment Questions 1. RESPIRATORY STATUS: Describe your breathing? (e.g., wheezing, shortness of breath, unable to speak, severe coughing)      SOB, weak 2. ONSET: When did this breathing problem begin?      Forever  3. PATTERN Does the difficult breathing come and go, or has it been constant since it started?      constant 4. SEVERITY: How bad is your breathing? (e.g., mild, moderate, severe)      Mild. [facility] does not appreciate audible SOB/wheezing during call. Pt is speaking in full sentences.  5. RECURRENT SYMPTOM: Have you had difficulty breathing before? If Yes, ask: When was the last time? and What happened that time?      *No Answer* 6.  CARDIAC HISTORY: Do you have any history of heart disease? (e.g., heart attack, angina, bypass surgery, angioplasty)      End-stage HF. 7. LUNG HISTORY: Do you have any history of lung disease?  (e.g., pulmonary embolus, asthma, emphysema)     *No Answer* 8. CAUSE: What do you think is causing the breathing problem?      CHF 9. OTHER SYMPTOMS: Do you have any other symptoms? (e.g., chest pain, cough, dizziness, fever, runny nose)     CP radiating to R arm 10. O2 SATURATION MONITOR:  Do you use an oxygen saturation monitor (pulse oximeter) at home? If Yes, ask: What is your reading (oxygen level) today? What is your usual oxygen saturation reading? (e.g., 95%)       N/a 11. PREGNANCY: Is there any chance you are pregnant? When was your last menstrual period?       N/a 12. TRAVEL: Have you traveled out of the country in the last month? (e.g., travel history, exposures)       N/a  Protocols used: Breathing Difficulty-A-AH

## 2024-08-19 NOTE — Telephone Encounter (Signed)
 Patient was already triaged today for similar symptoms. Patient was upset about not finding her keys. Patient was able to calm down and breathing at her baseline. Patient thought she would be able to drive to her appointment but unable to. Tried to encourage patient to call family to help get her to her appointment but patient refused. Patient is waiting on a phone call from PCP.  Copied from CRM #[id number]. Topic: Clinical - Red Word Triage >> Aug 19, 2024 12:48 PM Eva FALCON wrote: Red Word that prompted transfer to Nurse Triage: has appointment today, unable to find keys, emotionally not okay, feeling overwhelmed, trouble breathing.

## 2024-08-19 NOTE — Telephone Encounter (Signed)
Patient cancelled her appointment and rescheduled.

## 2024-08-20 ENCOUNTER — Telehealth: Payer: Self-pay | Admitting: Family Medicine

## 2024-08-20 NOTE — Telephone Encounter (Signed)
 Copied from CRM #[id number]. Topic: General - Other >> Aug 19, 2024  5:01 PM Hadassah PARAS wrote: Reason for CRM: Pt is calling returning missed call from PCP. Please call back #[phone]

## 2024-08-20 NOTE — Telephone Encounter (Signed)
 Called patient to remind her of her appointment tomorrow. She states that her son will be bringing her.

## 2024-08-20 NOTE — Telephone Encounter (Signed)
 This was a triage note from yesterday.  Pt has been contacted by our office, has appt for tomorrow.

## 2024-08-21 ENCOUNTER — Ambulatory Visit: Admitting: Family Medicine

## 2024-08-21 ENCOUNTER — Telehealth: Payer: Self-pay | Admitting: Family Medicine

## 2024-08-21 NOTE — Telephone Encounter (Signed)
 Copied from CRM [id number]. Topic: General - Running Late >> Aug 21, 2024 11:31 AM Alfonso HERO wrote: Patient/patient representative is calling because they are running late for an appointment. Patient calling to inform she is waiting on transportation and may be running a few minutes behind.

## 2024-08-21 NOTE — Telephone Encounter (Unsigned)
 Copied from CRM #[id number]. Topic: General - Other >> Aug 20, 2024 11:51 AM Carlyon D wrote: Reason for CRM: Pt is calling in regards to some misunderstanding about her diabetes and would like someone to give her a call back pt also states she has an appt tomorrow with pcp and would like things cleared up. Please reach out at earliest convenience

## 2024-08-21 NOTE — Telephone Encounter (Signed)
 Called to check on patient. Patient states she missed her appointment because her son did not show up to take her to her appointment. Patient states she will call back later to reschedule.

## 2024-08-27 ENCOUNTER — Telehealth: Payer: Self-pay

## 2024-08-27 NOTE — Progress Notes (Unsigned)
 Advanced [facility] Failure Clinic Note    PCP: Rilla Baller, MD  Primary Cardiologist: Perla Lye, MD  HF provider: Gardenia Led, MD   Chief Complaint: shortness of breath   HPI:  [place] is a 83 yo female with a PMHx significant for HTN, HLD, hypothyroidism, asthma, CAD, LADA, HA's, fibromyalgia, PAD, vasospasm, Sjogren's syndrome, and CHF.   Echo 07/30/20: EF of 55-60% with grade II diastolic dysfunction. Echo 12/24/21: EF of 60-65% along with mild MR.  Was in the ED 11/16/22 due to SOB due to HF exacerbation. Was in the ED 01/13/23 due to SOB/ pedal edema. Elevated D-dimer. VQ scan negative for PE.   RHC 12/02/22: No critical coronary artery disease. The is up to ~50% stenosis at the ostium of the RCA with pressure dampening noted using 58F diagnostic catheter.  Slight improvement noted with intracoronary nitroglycerin  suggesting at least some component of vasospasm.  No angiographically significant coronary artery disease noted in the left coronary artery. Normal left and right heart filling pressures. Normal Fick cardiac output/index. Small right radial artery with significant vasospasm throughout the procedure.  Recommend alternate access for future catheterizations. Recommendations: Add isosorbide  mononitrate 15 mg daily and as needed sublingual nitroglycerin  for antianginal therapy, including possible coronary vasospasm.  Echo 12/23/22: EF 60-65% with Grade I DD.   Was in the ED 03/14/23 due to chest pain that woke her from sleep associated with chest pressure and heaviness in both sides of her chest. EKG, CXR and labs were unremarkable and she was released with improvement of pain.   Was in the ED 07/20/23 due to shortness of breath & chest pain. EKG normal. CXR nl. D dimer elevated but VQ scan negative for PE.   Admitted 11/30/23 with near syncope while walking. Received IV fluid bolus, held antihypertensives and insulin  with improvement in blood pressure. Nothing  observed on telemetry. MRI showing no acute findings. Chest x-ray noted no consolidations. Upon discharge decrease dose of losartan  and stop beta-blocker. Will also decrease nightly insulin  from 60 units down to 30 units. Discharged the next day.   Echo 12/01/23: EF 60-65%, G1DD, normal RV, normal PA pressure  Admitted 03/08/24 with chest pain. On the night before admission patient developed nausea, vomiting and diarrhea lasted the whole night, resolved next morning. Later in the morning she developed chest pain. No associated symptoms. On presentation vital stable, labs with elevated D-dimer at 0.83. Mild leukocytosis at 11.7 and mild hypokalemia. Chest x-ray negative for acute abnormalities. Troponin remained negative x 2 and EKG with no acute ST changes. Pain seems reproducible likely costochondritis. CTA 03/09/24 was negative for any PE or other acute abnormality. Potassium corrected.   Was in the ED 04/03/24 with glucose 300-400 range. IVF and SQ insulin  given with improvement of glucose. No UTI, DKA. CXR negative.   Was in the ED 04/05/24 with chest pain and shortness of breath. Continues with elevated glucose. No DKA. Cardiology consulted.   LHC 04/22/24: EF 55-65%. Mild nonobstructive coronary artery disease. The ostial RCA stenosis appears to be less than seen last year. Suspect there was catheter induced spasm. Normal LV systolic function. Moderately elevated left ventricular end-diastolic pressure at 24 mmHg.  Admitted 07/25/24 with myriad of symptoms including shortness of breath, chest pain, headache, nausea, difficulty with ambulation. Per report her story was tangential and difficult to follow. She reported episodes at home that did not occur per her son. Head CT without acute intracranial abnormality and revealed stable atrophy and chronic small vessel disease.  CXR without cardiopulmonary disease. Neurology consulted. Brain MRI without acute disease. UA normal. TTE 1031/25: EF 60 to 65%, grade 1  diastolic dysfunction. No significant valvular disease. Psychiatry consulted regarding worsening cognitive issues.    She presents today, with her son/ DIL, for a HF f/u visit with a chief complaint of shortness of breath. Has associated fatigue, chest pain, palpitations, pedal edema, worsening cognitive decline. Able to sleep flat without SOB and was not SOB when walking into the office today. She says that she still manages her medications and takes them from the bottles. She is unclear of how she takes them but says that she gets them all in throughout the day. Family has now taken her car keys as she attempted to drive a few weeks ago to her appointment and got lost. Recent admission ruled out a UTI as a cause of her cognitive decline.   ROS: All systems negative except as listed in HPI, PMH and Problem List.  SH:  Social History   Socioeconomic History   Marital status: Widowed    Spouse name: Not on file   Number of children: 2   Years of education: Not on file   Highest education level: 12th grade  Occupational History   Occupation: retired  Tobacco Use   Smoking status: Never   Smokeless tobacco: Never  Vaping Use   Vaping status: Never Used  Substance and Sexual Activity   Alcohol use: No   Drug use: No   Sexual activity: Not on file  Other Topics Concern   Not on file  Social History Narrative   [place]ives in [place], [place] from [place].    Widow - husband decreased 01/2016 of metastatic colon CA   No pets.   Son [place] lives nearby. Daughter lives in [place] . Sister lives 2 blocks away.    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, [employer]   Exercise - limited   Diet - good water, fruits/vegetables daily, limited meat, protein drink every morning   Social Drivers of Corporate Investment Banker Strain: Medium Risk (03/26/2024)   Overall Financial Resource Strain (CARDIA)     Difficulty of Paying Living Expenses: Somewhat hard  Food Insecurity: No Food Insecurity (07/26/2024)   Hunger Vital Sign    Worried About Running Out of Food in the Last Year: Never true    Ran Out of Food in the Last Year: Never true  Transportation Needs: No Transportation Needs (07/26/2024)   PRAPARE - Administrator, Civil Service (Medical): No    Lack of Transportation (Non-Medical): No  Physical Activity: Insufficiently Active (10/31/2023)   Exercise Vital Sign    Days of Exercise per Week: 3 days    Minutes of Exercise per Session: 20 min  Stress: No Stress Concern Present (10/31/2023)   [employer] of Occupational Health - Occupational Stress Questionnaire    Feeling of Stress : Only a little  Social Connections: Moderately Integrated (07/26/2024)   Social Connection and Isolation Panel    Frequency of Communication with Friends and Family: Three times a week    Frequency of Social Gatherings with Friends and Family: Three times a week    Attends Religious Services: More than 4 times per year    Active Member of Clubs or Organizations: Yes    Attends Banker Meetings: More than 4 times per year  Marital Status: Widowed  Intimate Partner Violence: Not At Risk (07/26/2024)   Humiliation, Afraid, Rape, and Kick questionnaire    Fear of Current or Ex-Partner: No    Emotionally Abused: No    Physically Abused: No    Sexually Abused: No    FH:  Family History  Problem Relation Age of Onset   CAD Mother 27       MI, aortic valve issues   COPD Mother    Lupus Mother    Yvone' disease Mother    Rheum arthritis Mother    CAD Father 8       CABG x2, aortic valve replacement   Stroke Sister    CAD Sister    Anuerysm Sister        brain   Lupus Sister    Diabetes Sister    Diabetes Sister    Breast cancer Sister    Alcohol abuse Brother    CAD Brother 70       MI   Stroke Brother    COPD Brother        agent orange   CAD Brother 60        stent   Diabetes Brother    Stroke Maternal Grandmother    Hypertension Maternal Grandmother    Gallbladder disease Maternal Grandmother    Breast cancer Maternal Aunt    Breast cancer Maternal Aunt    Depression Grandchild    Colon cancer Neg Hx    Esophageal cancer Neg Hx    Rectal cancer Neg Hx    Stomach cancer Neg Hx     Past Medical History:  Diagnosis Date   Acute diverticulitis 04/2021   [facility], CT confirmed   Allergy    ANA positive    positive ANA pattern 1 speckled   Arthritis    Carotid artery occlusion    Carotid stenosis, asymptomatic 06/19/2015   1-39% RICA 40-59% LICA rpt 1 yr (05/2015)    CHF (congestive heart failure) (HCC)    Colon polyps    COVID-19 virus infection 09/14/2021   Dermatomyositis (HCC)    Diabetes mellitus without complication (HCC)    Type 1   Diverticulosis    sigmoid on CT scan 12/2019   Family history of adverse reaction to anesthesia    brothr went into cardiac arrest from anectine   Fibromyalgia    prior PCP   GERD (gastroesophageal reflux disease)    prior PCP   Glaucoma    Narrow angle   History of blood clots    DVT, in 20s, none since   History of chicken pox    History of diverticulitis    History of pericarditis 1986   with hospitalization   History of pneumonia 2014   History of shingles    History of UTI    Hyperlipidemia    Hypertension    Hypothyroidism    Mixed connective tissue disease    Partial small bowel obstruction (HCC) 12/2019   managed conservatively   Peptic ulcer    Pneumonia    PONV (postoperative nausea and vomiting)    Raynaud's disease without gangrene    Shoulder pain left   h/o RTC tendonitis and adhesive capsulitis   Sigmoid diverticulitis 05/26/2021   Sjogren's syndrome    Sleep apnea    prior PCP - no CPAP for about 10 yrs   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HCC)    Vitamin D  deficiency    prior PCP    Current  Outpatient Medications  Medication Sig Dispense Refill   acetaminophen   (TYLENOL ) 500 MG tablet Take 500 mg by mouth every 6 (six) hours as needed for mild pain (pain score 1-3) or headache.     albuterol  (VENTOLIN  HFA) 108 (90 Base) MCG/ACT inhaler Inhale 2 puffs into the lungs every 6 (six) hours as needed. 8 g 2   Ascorbic Acid  (VITAMIN C ) 100 MG tablet Take 100 mg by mouth daily.     Blood Glucose Monitoring Suppl (PRODIGY AUTOCODE BLOOD GLUCOSE) DEVI 1 Device by Does not apply route daily.     Cholecalciferol  (VITAMIN D3) 25 MCG (1000 UT) CAPS Take 2 capsules (2,000 Units total) by mouth daily.     clopidogrel  (PLAVIX ) 75 MG tablet TAKE 1 TABLET(75 MG) BY MOUTH EVERY EVENING 30 tablet 11   Continuous Glucose Receiver (FREESTYLE LIBRE 3 READER) DEVI Use to check blood sugar continuously. Z79.4. E13.65 1 each 0   Continuous Glucose Sensor (FREESTYLE LIBRE 3 PLUS SENSOR) MISC Change sensor every 15 days. E13.65, Z79.4 6 each 3   donepezil  (ARICEPT ) 10 MG tablet Take 1 tablet (10 mg total) by mouth at bedtime. 90 tablet 1   Fluticasone-Umeclidin-Vilant (TRELEGY ELLIPTA ) 100-62.5-25 MCG/ACT AEPB Inhale 1 puff into the lungs daily. 60 each 6   furosemide  (LASIX ) 20 MG tablet Take 1 tablet (20 mg total) by mouth daily. 30 tablet 3   Glucagon , rDNA, (GLUCAGON  EMERGENCY) 1 MG KIT INJECT into THE muscle ONCE AS NEEDED FOR emergency 1 kit 12   glucose blood (PRODIGY NO CODING BLOOD GLUC) test strip Use as instructed 100 each 12   insulin  degludec (TRESIBA  FLEXTOUCH) 100 UNIT/ML FlexTouch Pen Inject 60 Units into the skin at bedtime. 18 mL 0   insulin  lispro (HUMALOG ) 100 UNIT/ML injection Inject 0.07 mLs (7 Units total) into the skin 3 (three) times daily before meals. Dose per sliding scale 10 mL 0   isosorbide  mononitrate (IMDUR ) 30 MG 24 hr tablet Take 1 tablet (30 mg total) by mouth daily. 90 tablet 3   Lancets (ONETOUCH DELICA PLUS LANCET33G) MISC Apply topically.     Multiple Vitamin (MULTIVITAMIN ADULT) TABS Take 1 tablet by mouth in the morning and at bedtime.      Multiple Vitamins-Minerals (PRESERVISION AREDS PO) Take 1 tablet by mouth in the morning and at bedtime.     nitroGLYCERIN  (NITROSTAT ) 0.4 MG SL tablet Place 1 tablet (0.4 mg total) under the tongue every 5 (five) minutes as needed for chest pain. 25 tablet PRN   potassium chloride  (KLOR-CON ) 10 MEQ tablet Take 1 tablet (10 mEq total) by mouth daily. 90 tablet 3   rosuvastatin  (CRESTOR ) 20 MG tablet Take 1 tablet (20 mg total) by mouth every evening. 90 tablet 4   SYNTHROID  75 MCG tablet TAKE ONE TABLET BY MOUTH Monday-Saturday BEFORE breakfast 80 tablet 4   No current facility-administered medications for this visit.   Vitals:   08/28/24 1507  BP: (!) 168/68  Pulse: 81  SpO2: 99%  Weight: 141 lb 6.4 oz (64.1 kg)   Wt Readings from Last 3 Encounters:  08/28/24 141 lb 6.4 oz (64.1 kg)  07/25/24 152 lb 8.9 oz (69.2 kg)  07/11/24 142 lb 9.6 oz (64.7 kg)   Lab Results  Component Value Date   CREATININE 0.89 07/26/2024   CREATININE 0.80 07/25/2024   CREATININE 0.91 04/10/2024     PHYSICAL EXAM:  General: Well appearing elderly female.  Cor: No JVD. Regular rhythm, rate.  Lungs: clear Abdomen: soft,  nontender, nondistended. Extremities: trace pitting edema around ankles Neuro:. Affect pleasant. Repeats things numerous times.    ECG: not done  ReDs reading: 25 %, normal   ASSESSMENT & PLAN:  NICM with preserved EF- - likely d/t HTN as cath showed no critical CAD - NYHA III - euvolemic. Reds today is 25% - weight down 2 pounds from last visit here 3 months ago - Echo 07/30/20: EF of 55-60% with grade II diastolic dysfunction.  - Echo 6/68/76: EF of 60-65% along with mild MR.  - Echo 12/23/22: EF 60-65% with Grade I DD.  - Echo 12/01/23: EF 60-65%, G1DD, normal RV, normal PA pressure - TTE 07/26/24: EF 60 to 65%, grade 1 diastolic dysfunction.  No significant valvular disease.  - adhering to low sodium diet and fluid restriction - continue furosemide  20mg  daily / potassium  10meq daily - valsartan caused hacking cough - can not add SGLT2 due to LADA - encouraged to wear compression socks daily and elevate legs when sitting for long periods  - BNP 07/25/24 was 69.3  2. HTN - BP 180/67, rechecked was 168/68 - resume losartan  25mg  daily. BMET next visit if not done elsewhere - saw PCP Achilles) 09/25 - BMP 07/26/24 reviewed: sodium 141, potassium 3.6, creatinine 0.89 & GFR >60   3: CAD- - RHC 12/02/22:   No critical coronary artery disease. The is up to ~50% stenosis at the ostium of the RCA with pressure dampening noted using 58F diagnostic catheter. Slight improvement noted with intracoronary nitroglycerin  suggesting at least some component of vasospasm. No angiographically significant coronary artery disease noted in the left coronary artery. Normal left and right heart filling pressures. Normal Fick cardiac output/index. Small right radial artery with significant vasospasm throughout the procedure.  - saw cardiology Florestine) 09/25 - LHC 04/22/24: EF 55-65%. Mild nonobstructive coronary artery disease. The ostial RCA stenosis appears to be less than seen last year. Suspect there was catheter induced spasm. Normal LV systolic function. Moderately elevated left ventricular end-diastolic pressure at 24 mmHg. - continue plavix  75mg  daily - continue isosorbide  MN 30mg  daily  4. LADA (managed by endocrinology)- - saw endocrinology Johnnie) 03/25 - A1c 07/25/24 reviewed and was 10.7%  5: Mild obstructive sleep apnea - saw pulmonology Herlene) 10/25 - home sleep study done 09/22/22 which showed mild obstructive sleep apnea with AHI of 8.5 and oxygen saturations as low as 82% nocturnally. Patient declined CPAP/oxygen.  - discussed doing overnight oximetry and see if she qualifies for nocturnal oxygen. Question whether low oxygen sats are contributing to cognitive decline.  - PFT's 04/22/22  6: Hyperlipidemia- - continue rosuvastatin  20mg  daily - LDL 07/26/24 was  158  7: Cognitive decline- - continue aricept  10mg  daily - Spent a lot of talking talking with patient/ family in room as well as son outside of the room regarding her decline. Patient is aware of her memory issues but does not want any assistance in the home. Reviewed safety of her taking her medications and discussed pill packaging with her. Patient is interested and agreeable to this so her local pharmacy was contacted. Son will take medication bottles to her pharmacy for packaging.    Return in 2 months, sooner if needed.   I spent 39 minutes reviewing records, interviewing/ examing patient and managing plan/ orders.   Ellouise DELENA Class, FNP 08/27/24

## 2024-08-27 NOTE — Telephone Encounter (Signed)
 Pt son and daughter in law called. They wanted to confirm the pt's appt for tomorrow. Wanted to know what the visit entails. Per the pt's son, the pt has been under the impression that she is going to have surgery tomorrow. Confirmed with the family that no surgery is planned with our office. The visit is a follow up visit. Family states that the pt no longer drives after getting lost multiple times.

## 2024-08-28 ENCOUNTER — Encounter: Payer: Self-pay | Admitting: Family

## 2024-08-28 ENCOUNTER — Ambulatory Visit: Attending: Family | Admitting: Family

## 2024-08-28 ENCOUNTER — Telehealth: Payer: Self-pay

## 2024-08-28 ENCOUNTER — Telehealth: Payer: Self-pay | Admitting: Family Medicine

## 2024-08-28 VITALS — BP 168/68 | HR 81 | Wt 141.4 lb

## 2024-08-28 DIAGNOSIS — E785 Hyperlipidemia, unspecified: Secondary | ICD-10-CM | POA: Diagnosis not present

## 2024-08-28 DIAGNOSIS — G4733 Obstructive sleep apnea (adult) (pediatric): Secondary | ICD-10-CM

## 2024-08-28 DIAGNOSIS — E139 Other specified diabetes mellitus without complications: Secondary | ICD-10-CM | POA: Diagnosis not present

## 2024-08-28 DIAGNOSIS — M3500 Sicca syndrome, unspecified: Secondary | ICD-10-CM | POA: Diagnosis not present

## 2024-08-28 DIAGNOSIS — E782 Mixed hyperlipidemia: Secondary | ICD-10-CM

## 2024-08-28 DIAGNOSIS — I428 Other cardiomyopathies: Secondary | ICD-10-CM | POA: Diagnosis not present

## 2024-08-28 DIAGNOSIS — I10 Essential (primary) hypertension: Secondary | ICD-10-CM

## 2024-08-28 DIAGNOSIS — M797 Fibromyalgia: Secondary | ICD-10-CM | POA: Diagnosis not present

## 2024-08-28 DIAGNOSIS — I25118 Atherosclerotic heart disease of native coronary artery with other forms of angina pectoris: Secondary | ICD-10-CM | POA: Diagnosis not present

## 2024-08-28 DIAGNOSIS — I2510 Atherosclerotic heart disease of native coronary artery without angina pectoris: Secondary | ICD-10-CM | POA: Diagnosis not present

## 2024-08-28 DIAGNOSIS — Z833 Family history of diabetes mellitus: Secondary | ICD-10-CM | POA: Diagnosis not present

## 2024-08-28 DIAGNOSIS — I110 Hypertensive heart disease with heart failure: Secondary | ICD-10-CM | POA: Diagnosis not present

## 2024-08-28 DIAGNOSIS — R0602 Shortness of breath: Secondary | ICD-10-CM

## 2024-08-28 DIAGNOSIS — Z8249 Family history of ischemic heart disease and other diseases of the circulatory system: Secondary | ICD-10-CM | POA: Diagnosis not present

## 2024-08-28 DIAGNOSIS — I5032 Chronic diastolic (congestive) heart failure: Secondary | ICD-10-CM | POA: Diagnosis not present

## 2024-08-28 DIAGNOSIS — Z7902 Long term (current) use of antithrombotics/antiplatelets: Secondary | ICD-10-CM | POA: Diagnosis not present

## 2024-08-28 DIAGNOSIS — Z79899 Other long term (current) drug therapy: Secondary | ICD-10-CM | POA: Diagnosis not present

## 2024-08-28 DIAGNOSIS — R4189 Other symptoms and signs involving cognitive functions and awareness: Secondary | ICD-10-CM

## 2024-08-28 DIAGNOSIS — Z8616 Personal history of COVID-19: Secondary | ICD-10-CM | POA: Diagnosis not present

## 2024-08-28 DIAGNOSIS — Z794 Long term (current) use of insulin: Secondary | ICD-10-CM | POA: Diagnosis not present

## 2024-08-28 DIAGNOSIS — Z59868 Other specified financial insecurity: Secondary | ICD-10-CM | POA: Diagnosis not present

## 2024-08-28 DIAGNOSIS — J45909 Unspecified asthma, uncomplicated: Secondary | ICD-10-CM | POA: Diagnosis not present

## 2024-08-28 DIAGNOSIS — E039 Hypothyroidism, unspecified: Secondary | ICD-10-CM | POA: Diagnosis not present

## 2024-08-28 MED ORDER — LOSARTAN POTASSIUM 25 MG PO TABS: 25.0000 mg | ORAL_TABLET | Freq: Every day | ORAL | 5 refills | Status: AC

## 2024-08-28 NOTE — Telephone Encounter (Signed)
 Called patient she has some questions she would like to talk with Dr.G  and only Dr. KANDICE. She does not want anyone else around when she talks to him. She states that it is personal. I asked if it was a medical emergency and she said no bur refused to give any further information.

## 2024-08-28 NOTE — Telephone Encounter (Addendum)
 Spoke with patient.  She states she stopped driving about 3-4 wks ago.  Notes intermittent weakness and dizziness.  Verified next Tues appt at 3:30pm. She states she will find someone to drive her.

## 2024-08-28 NOTE — Telephone Encounter (Signed)
 Copied from CRM #[id number]. Topic: Clinical - Medical Advice >> Aug 28, 2024  4:58 PM Tysheama G wrote: Reason for CRM: Patient wants Dr.G to call her asap. Callback number [phone]

## 2024-08-28 NOTE — Telephone Encounter (Signed)
 Copied from CRM #[id number]. Topic: General - Call Back - No Documentation >> Aug 28, 2024  9:20 AM Wess RAMAN wrote: Reason for CRM: Patient missed call from clinic and would like a call back from Dr. Rilla. She stated she will be at [facility] today around 3p but will have her cellphone on her.  Callback #: [phone]

## 2024-08-28 NOTE — Progress Notes (Signed)
 ReDS Vest / Clip - 08/28/24 1500       ReDS Vest / Clip   Station Marker A    Ruler Value 28    ReDS Value Range Low volume    ReDS Actual Value 25

## 2024-08-28 NOTE — Patient Instructions (Addendum)
 Medication Changes:  START Losartan  25mg  (1 tab) daily  PLEASE BRING ALL OF YOUR REGULAR MEDICATIONS TO YOUR PHARMACY TO [facility] to get them packed.   [place]   Special Instructions // Education:  You have been ordered an overnight oximetry test. [facility] will be in contact with you in order to ship the equipment to you.  Follow-Up in: Please follow up with the Advanced Heart Failure Clinic in 2 months with [name], FNP.   Thank you for choosing [facility].    At the Advanced Heart Failure Clinic, you and your health needs are our priority. We have a designated team specialized in the treatment of Heart Failure. This Care Team includes your primary Heart Failure Specialized Cardiologist (physician), Advanced Practice Providers (APPs- Physician Assistants and Nurse Practitioners), and Pharmacist who all work together to provide you with the care you need, when you need it.   You may see any of the following providers on your designated Care Team at your next follow up:  Dr. Toribio Fuel Dr. Ezra Shuck Dr. Ria Commander Dr. Morene Brownie [name], FNP Jaun Bash, RPH-CPP  Please be sure to bring in all your medications bottles to every appointment.   Need to Contact Us :  If you have any questions or concerns before your next appointment please send us  a message through [place] or call our office at [phone].    TO LEAVE A MESSAGE FOR THE NURSE SELECT OPTION 2, PLEASE LEAVE A MESSAGE INCLUDING: YOUR NAME DATE OF BIRTH CALL BACK NUMBER REASON FOR CALL**this is important as we prioritize the call backs  YOU WILL RECEIVE A CALL BACK THE SAME DAY AS LONG AS YOU CALL BEFORE 4:00 PM

## 2024-08-28 NOTE — Telephone Encounter (Addendum)
 Spoke with patient. See other phone note.  She had a good visit with Ellouise.

## 2024-08-29 ENCOUNTER — Encounter: Payer: Self-pay | Admitting: Family

## 2024-08-30 ENCOUNTER — Telehealth: Payer: Self-pay | Admitting: Family Medicine

## 2024-08-30 NOTE — Telephone Encounter (Signed)
 Msg routed incorrectly to [facility] clinic; error reported

## 2024-08-30 NOTE — Telephone Encounter (Unsigned)
 Copied from CRM [id number]. Topic: General - Call Back - No Documentation >> Aug 30, 2024 12:45 PM Viola F wrote: Reason for CRM: Patient says she's returning a call from Dr. Bettylou regarding her health condition (did not disclose any more information) - please call her back at [phone] (H) >> Aug 30, 2024  1:08 PM Dedra B wrote: Pt says she is a returning a call for Dr. Rilla. Did not see documentation of a call. Pls call pt's cell phone [phone].

## 2024-08-30 NOTE — Telephone Encounter (Signed)
 We did not reach out to patient. I spoke with her Wednesday evening by phone.

## 2024-08-30 NOTE — Telephone Encounter (Signed)
 Copied from CRM #[id number]. Topic: General - Other >> Aug 30, 2024  8:21 AM Tiffini S wrote: Reason for CRM: Patient states that she had a missed call from the pcp office-  Please call the patient back at [phone] and [phone]

## 2024-09-03 ENCOUNTER — Encounter: Payer: Self-pay | Admitting: Family Medicine

## 2024-09-03 ENCOUNTER — Ambulatory Visit: Admitting: Family Medicine

## 2024-09-03 VITALS — BP 148/80 | HR 109 | Temp 97.8°F | Ht 62.5 in | Wt 130.0 lb

## 2024-09-03 DIAGNOSIS — E13319 Other specified diabetes mellitus with unspecified diabetic retinopathy without macular edema: Secondary | ICD-10-CM

## 2024-09-03 DIAGNOSIS — G3184 Mild cognitive impairment, so stated: Secondary | ICD-10-CM

## 2024-09-03 DIAGNOSIS — I10 Essential (primary) hypertension: Secondary | ICD-10-CM | POA: Diagnosis not present

## 2024-09-03 DIAGNOSIS — I5032 Chronic diastolic (congestive) heart failure: Secondary | ICD-10-CM | POA: Diagnosis not present

## 2024-09-03 NOTE — Patient Instructions (Addendum)
 Labs today. Contact [facility] [phone] to ask about personal care services.  Restart isosorbide  30mg  daily.  I do recommend you try freestyle libre 3 continuous glucose monitor.  Return in 2 weeks for follow up visit.  Bring in sugar readings to next visit.

## 2024-09-03 NOTE — Progress Notes (Unsigned)
 Ph: [phone] Fax: [phone]   Patient ID: Cassandra Benson, female    DOB: 18-May-1941, 83 y.o.   MRN: [id number]  This visit was conducted in person.  BP (!) 148/80 (Cuff Size: Small)   Pulse (!) 109   Temp 97.8 F (36.6 C) (Oral)   Ht 5' 2.5 (1.588 m)   Wt 130 lb (59 kg)   SpO2 97%   BMI 23.40 kg/m   BP Readings from Last 3 Encounters:  09/03/24 (!) 148/80  08/28/24 (!) 168/68  07/26/24 95/82    CC: follow up visit  Subjective:   HPI: Cassandra Benson is a 84 y.o. female presenting on 09/03/2024 for Medical Management of Chronic Issues (Follow up visit,//Pt is with son Lavra Imler and his wife Mrs. Sayer - they are concerned about pt and would like to speak with dr privately... as well as discuss th best care plan for her)   See prior notes and phone notes. I last saw patient 05/2024.   LADA.  Current diabetic regimen: Tresiba  60u nightly at 10pm (pens)  Lispro (Humalog ) 8-10u before meals (pens)   Ongoing memory impairment concern. She continues aricept  10mg  nightly. She has seen Dr Lane, last seen 12/2023.   She stopped driving about 3-4 weeks ago. Family has taken her keys.   20 lb weight loss noted over the past 7 months.  She receives meals on wheels meal every day.  Family also brings her meals.   She was referred for neurocognitive psychological testing 01/2024 - met initial session but refused to return to complete this.   Mild asthma - sees pulmonology on PRN albuterol  inhaler and daily trelegy. Planned PFTs - this needs to be scheduled.      Relevant past medical, surgical, family and social history reviewed and updated as indicated. Interim medical history since our last visit reviewed. Allergies and medications reviewed and updated. Outpatient Medications Prior to Visit  Medication Sig Dispense Refill   Blood Glucose Monitoring Suppl (ONE TOUCH ULTRA 2) w/Device KIT SMARTSIG:Via Meter     acetaminophen  (TYLENOL ) 500 MG tablet  Take 500 mg by mouth every 6 (six) hours as needed for mild pain (pain score 1-3) or headache.     albuterol  (VENTOLIN  HFA) 108 (90 Base) MCG/ACT inhaler Inhale 2 puffs into the lungs every 6 (six) hours as needed. 8 g 2   Ascorbic Acid  (VITAMIN C ) 100 MG tablet Take 100 mg by mouth daily.     Blood Glucose Monitoring Suppl (PRODIGY AUTOCODE BLOOD GLUCOSE) DEVI 1 Device by Does not apply route daily.     Cholecalciferol  (VITAMIN D3) 25 MCG (1000 UT) CAPS Take 2 capsules (2,000 Units total) by mouth daily.     clopidogrel  (PLAVIX ) 75 MG tablet TAKE 1 TABLET(75 MG) BY MOUTH EVERY EVENING 30 tablet 11   Continuous Glucose Receiver (FREESTYLE LIBRE 3 READER) DEVI Use to check blood sugar continuously. Z79.4. E13.65 1 each 0   Continuous Glucose Sensor (FREESTYLE LIBRE 3 PLUS SENSOR) MISC Change sensor every 15 days. E13.65, Z79.4 6 each 3   donepezil  (ARICEPT ) 10 MG tablet Take 1 tablet (10 mg total) by mouth at bedtime. 90 tablet 1   Fluticasone-Umeclidin-Vilant (TRELEGY ELLIPTA ) 100-62.5-25 MCG/ACT AEPB Inhale 1 puff into the lungs daily. 60 each 6   furosemide  (LASIX ) 20 MG tablet Take 1 tablet (20 mg total) by mouth daily. 30 tablet 3   Glucagon , rDNA, (GLUCAGON  EMERGENCY) 1 MG KIT INJECT into THE muscle ONCE AS NEEDED  FOR emergency 1 kit 12   glucose blood (PRODIGY NO CODING BLOOD GLUC) test strip Use as instructed 100 each 12   insulin  degludec (TRESIBA  FLEXTOUCH) 100 UNIT/ML FlexTouch Pen Inject 60 Units into the skin at bedtime. 18 mL 0   insulin  lispro (HUMALOG ) 100 UNIT/ML injection Inject 0.07 mLs (7 Units total) into the skin 3 (three) times daily before meals. Dose per sliding scale 10 mL 0   isosorbide  mononitrate (IMDUR ) 30 MG 24 hr tablet Take 1 tablet (30 mg total) by mouth daily. 90 tablet 3   Lancets (ONETOUCH DELICA PLUS LANCET33G) MISC Apply topically.     losartan  (COZAAR ) 25 MG tablet Take 1 tablet (25 mg total) by mouth daily. 30 tablet 5   Multiple Vitamin (MULTIVITAMIN ADULT)  TABS Take 1 tablet by mouth in the morning and at bedtime.     Multiple Vitamins-Minerals (PRESERVISION AREDS PO) Take 1 tablet by mouth in the morning and at bedtime.     nitroGLYCERIN  (NITROSTAT ) 0.4 MG SL tablet Place 1 tablet (0.4 mg total) under the tongue every 5 (five) minutes as needed for chest pain. 25 tablet PRN   potassium chloride  (KLOR-CON ) 10 MEQ tablet Take 1 tablet (10 mEq total) by mouth daily. 90 tablet 3   rosuvastatin  (CRESTOR ) 20 MG tablet Take 1 tablet (20 mg total) by mouth every evening. 90 tablet 4   SYNTHROID  75 MCG tablet TAKE ONE TABLET BY MOUTH Monday-Saturday BEFORE breakfast 80 tablet 4   No facility-administered medications prior to visit.     Per HPI unless specifically indicated in ROS section below Review of Systems  Objective:  BP (!) 148/80 (Cuff Size: Small)   Pulse (!) 109   Temp 97.8 F (36.6 C) (Oral)   Ht 5' 2.5 (1.588 m)   Wt 130 lb (59 kg)   SpO2 97%   BMI 23.40 kg/m   Wt Readings from Last 3 Encounters:  09/03/24 130 lb (59 kg)  08/28/24 141 lb 6.4 oz (64.1 kg)  07/25/24 152 lb 8.9 oz (69.2 kg)      Physical Exam Vitals and nursing note reviewed.  Constitutional:      Appearance: Normal appearance. She is not ill-appearing.  HENT:     Head: Normocephalic and atraumatic.     Mouth/Throat:     Mouth: Mucous membranes are moist.     Pharynx: Oropharynx is clear. No oropharyngeal exudate or posterior oropharyngeal erythema.  Eyes:     Extraocular Movements: Extraocular movements intact.     Pupils: Pupils are equal, round, and reactive to light.  Cardiovascular:     Rate and Rhythm: Normal rate and regular rhythm.     Pulses: Normal pulses.     Heart sounds: Normal heart sounds. No murmur heard. Pulmonary:     Effort: Pulmonary effort is normal. No respiratory distress.     Breath sounds: Normal breath sounds. No wheezing, rhonchi or rales.  Musculoskeletal:     Right lower leg: No edema.     Left lower leg: No edema.   Skin:    General: Skin is warm and dry.     Findings: No rash.  Neurological:     Mental Status: She is alert.  Psychiatric:        Mood and Affect: Mood normal.        Behavior: Behavior normal.       Lab Results  Component Value Date   CHOL 262 (H) 07/26/2024   HDL 37 (L) 07/26/2024  LDLCALC 158 (H) 07/26/2024   LDLDIRECT 113.0 09/09/2020   TRIG 334 (H) 07/26/2024   CHOLHDL 7.1 07/26/2024    Lab Results  Component Value Date   NA 141 07/26/2024   CL 105 07/26/2024   K 3.6 07/26/2024   CO2 26 07/26/2024   BUN 18 07/26/2024   CREATININE 0.89 07/26/2024   GFRNONAA >60 07/26/2024   CALCIUM  9.0 07/26/2024   PHOS 3.6 07/26/2024   ALBUMIN  3.7 07/25/2024   GLUCOSE 263 (H) 07/26/2024    Lab Results  Component Value Date   TSH 2.325 07/25/2024    Lab Results  Component Value Date   HGBA1C 10.7 (H) 07/25/2024   Lab Results  Component Value Date   WBC 7.7 07/26/2024   HGB 12.6 07/26/2024   HCT 38.5 07/26/2024   MCV 82.8 07/26/2024   PLT 190 07/26/2024    Assessment & Plan:   Problem List Items Addressed This Visit     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   Orders Placed This Encounter  Procedures   Basic metabolic panel with GFR   Fructosamine    Patient Instructions  Labs today. Contact [facility] [phone] to ask about personal care services.  Restart isosorbide  30mg  daily.  I do recommend you try freestyle libre 3 continuous glucose monitor.  Return in 2 weeks for follow up visit.  Bring in sugar readings to next visit.   Follow up plan: Return in about 2 weeks (around 09/17/2024) for follow up visit.  Anton Blas, MD

## 2024-09-04 ENCOUNTER — Telehealth: Payer: Self-pay | Admitting: Family Medicine

## 2024-09-04 LAB — BASIC METABOLIC PANEL WITH GFR
BUN: 16 mg/dL (ref 6–23)
CO2: 28 meq/L (ref 19–32)
Calcium: 9.8 mg/dL (ref 8.4–10.5)
Chloride: 98 meq/L (ref 96–112)
Creatinine, Ser: 1 mg/dL (ref 0.40–1.20)
GFR: 52.58 mL/min — ABNORMAL LOW (ref >60.00)
Glucose, Bld: 367 mg/dL — ABNORMAL HIGH (ref 70–99)
Potassium: 3.5 meq/L (ref 3.5–5.1)
Sodium: 137 meq/L (ref 135–145)

## 2024-09-04 NOTE — Telephone Encounter (Signed)
 Left message to return call to our office.

## 2024-09-04 NOTE — Assessment & Plan Note (Addendum)
 I and family have concerns over her ability to administer her insulin  appropriately, I have been unable to manage insulin  effectively as I have no home sugar readings to safely titrate insulin .  Pt has been resistant to previous assistance provided. She has refused RN daughter in law's assistance in diabetes management.  She did not follow up with my pharmacist for CGM training. 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  I recommended son hire daytime home care nurse/aide to help with safe insulin  administration at home.  [facility] # provided.  Too soon for A1c - will update fructosamine and BMP today.  Now with noted weight loss raising concern for impaired nutrition/ability to care for self.

## 2024-09-04 NOTE — Assessment & Plan Note (Signed)
 Chronic, BP better controlled with recent recommencement of losartan  25mg  daily.  She doesn't think she's been taking imdur  30mg  daily - rec start this.

## 2024-09-04 NOTE — Telephone Encounter (Signed)
 Please contact son Shaasia Odle at  #[phone]  Mrs Glaspy previously saw Dr Evalene Riff PsyD at [facility] for initial neuropsychological evaluation 02/01/2024 - recommend they contact [place] office at [phone] to inquire about scheduling f/u visits to complete evaluation.   I don't know that this is offered in [place].

## 2024-09-04 NOTE — Telephone Encounter (Signed)
 Unable to reach pt's son, Adina,  at this time, left voicemail instructing him to call back.  Please provide message from provider/office when call is returned from patient.

## 2024-09-04 NOTE — Assessment & Plan Note (Signed)
Appreciate advanced CHF clinic care.

## 2024-09-04 NOTE — Assessment & Plan Note (Addendum)
 Ongoing concerns over memory affecting ability to care for herself at home, manage medications.  Difficult to determine memory impairment vs personality components to condition.  I did recommend she complete neurocognitive psychological evaluation started 01/2024 - will provide # for [facility] neuropsych for son to contact to ask about rescheduling.  I also recommended they hire home care aide to assist with medication management especially insulin  administration and to help record sugar log for my review.  She is frustrated over ongoing medical conditions, is defensive over her ability to care for herself, and displays impaired insight into her medical conditions and their management.  I have asked her to return in 2 weeks for follow up visit.

## 2024-09-05 ENCOUNTER — Encounter: Admitting: Family

## 2024-09-05 NOTE — Telephone Encounter (Signed)
 Left message to return call to our office.

## 2024-09-06 LAB — FRUCTOSAMINE: Fructosamine: 403 umol/L — ABNORMAL HIGH (ref 205–285)

## 2024-09-09 ENCOUNTER — Ambulatory Visit: Payer: Self-pay | Admitting: Family Medicine

## 2024-09-09 NOTE — Telephone Encounter (Signed)
 We have called son and left message with no call back several times. Do you have another number for him? Or another way you would like us  to reach out?

## 2024-09-09 NOTE — Telephone Encounter (Signed)
 Was not able to Reach Cassandra Benson's son Cassandra Benson, but was able to verify phone number with Cassandra Benson. Left Cassandra Benson son, Cassandra Benson a voicemail to please call back regarding info about his mother, if able to reach please share information from Dr. KANDICE provided in this encounter and previous encounter as we have not been able to reach him.   Called and spoke with Cassandra Benson, she is very upset able sugar reading and states she is doing everything as directed by Dr. Ruthe. States she eats no sugar only meat and veggies. Cassandra Benson blames high blood sugar on inflammation from past hip replacement and face pain.  Cassandra Benson very against home aide, states she does not trust anyone. Cassandra Benson states sister helps her with medications.

## 2024-09-09 NOTE — Telephone Encounter (Signed)
 Ok to stop contacting son. I will try to reach out to him later this week.

## 2024-09-12 ENCOUNTER — Ambulatory Visit: Payer: Self-pay

## 2024-09-12 NOTE — Telephone Encounter (Signed)
 Spoke with pt and she is aware of Dr. Elfredia recommendation. States that she will have one of her children take her to [facility] to be seen.

## 2024-09-12 NOTE — Telephone Encounter (Signed)
 FYI Only or Action Required?: Action required by provider: clinical question for provider and update on patient condition.  Patient was last seen in primary care on 09/03/2024 by Rilla Baller, MD.  Called Nurse Triage reporting Hyperglycemia.  Symptoms began elevated fasting blood glucose x this week.  Interventions attempted: Prescription medications: insulin  Tresiba  60 units taken about an hour ago.  Symptoms are: gradually worsening.  Triage Disposition: Call PCP Now  Patient/caregiver understands and will follow disposition?: Yes            Copied from CRM #[id number]. Topic: Clinical - Red Word Triage >> Sep 12, 2024  1:26 PM Alexandria E wrote: [facility] that prompted transfer to Nurse Triage: Elevated blood sugar. Reading 456 at 12:24pm, patient also took 60 units of insulin . Reason for Disposition  Blood glucose > 400 mg/dL (77.7 mmol/L)  Answer Assessment - Initial Assessment Questions 1. BLOOD GLUCOSE: What is your blood glucose level?      0830AM when she woke up it was 410 then rechecked 456.  2. ONSET: When did you check the blood glucose?     BG 456 at 12:24pm.  3. USUAL RANGE: What is your glucose level usually? (e.g., usual fasting morning value, usual evening value)     Usual fasting morning value is 200s. She states earlier this week it was 300s.  4. KETONES: Do you check for ketones (urine or blood test strips)? If Yes, ask: What does the test show now?      No.  5. TYPE 1 or 2:  Do you know what type of diabetes you have?  (e.g., Type 1, Type 2, Gestational; doesn't know)      Patient states she is Type 1, per chart states Type 2.  6. INSULIN : Do you take insulin ? What type of insulin (s) do you use? What is the mode of delivery? (syringe, pen; injection or pump)?      Yes. She takes 60 units of Tresiba  (took about an hour ago), Lispro/Humalog  7 units has not been given due to patient has been holding since she has not eaten a  meal.  7. DIABETES PILLS: Do you take any pills for your diabetes? If Yes, ask: Have you missed taking any pills recently?     No.  8. OTHER SYMPTOMS: Do you have any symptoms? (e.g., fever, frequent urination, difficulty breathing, dizziness, weakness, vomiting)     Increased urinary frequency, constant burning with urination x today; she states her heart feels like it is beating faster . Denies vomiting, severe weakness, fever, difficulty breathing.  Protocols used: Diabetes - High Blood Sugar-A-AH

## 2024-09-12 NOTE — Telephone Encounter (Signed)
 I think that with the combination of dysuria and elevated sugar she needs recheck today either ER or UC if not here in clinic.  Thanks.

## 2024-09-14 NOTE — Telephone Encounter (Signed)
 Cassandra Benson seen in office with her son. I provided Cassandra contact info for Dr Woodson office to schedule completing neuropsychological testing

## 2024-09-14 NOTE — Telephone Encounter (Signed)
 DIL Alan seen in office with her son. I provided DIL contact info for Dr Woodson office to schedule completing neuropsychological testing

## 2024-09-17 ENCOUNTER — Ambulatory Visit (INDEPENDENT_AMBULATORY_CARE_PROVIDER_SITE_OTHER): Admitting: Family Medicine

## 2024-09-17 ENCOUNTER — Encounter: Payer: Self-pay | Admitting: Family Medicine

## 2024-09-17 ENCOUNTER — Telehealth: Payer: Self-pay

## 2024-09-17 VITALS — BP 128/64 | HR 111 | Temp 97.9°F | Ht 62.5 in | Wt 135.4 lb

## 2024-09-17 DIAGNOSIS — E13319 Other specified diabetes mellitus with unspecified diabetic retinopathy without macular edema: Secondary | ICD-10-CM | POA: Diagnosis not present

## 2024-09-17 DIAGNOSIS — G3184 Mild cognitive impairment, so stated: Secondary | ICD-10-CM

## 2024-09-17 MED ORDER — NITROGLYCERIN 0.4 MG SL SUBL: 0.4000 mg | SUBLINGUAL_TABLET | SUBLINGUAL | 1 refills | Status: AC | PRN

## 2024-09-17 NOTE — Telephone Encounter (Signed)
 Received fax that they are unable to reach pt at any number sent in ( pt and son's number) in order to schedule overnight oximetry. They have cancelled the order.

## 2024-09-17 NOTE — Patient Instructions (Addendum)
 Check at home that you're taking isosorbide  30mg  daily. Possible side effect is headache so if that happens take 1/2 tablet daily. See effect on chest pain.  We will work towards scheduling memory evaluation testing in [place].  Return in 1 month for follow up visit.  Continue pill box system.

## 2024-09-17 NOTE — Progress Notes (Signed)
 " Ph: [phone] Fax: [phone]   Patient ID: Cassandra Benson, female    DOB: 02/14/1941, 83 y.o.   MRN: [id number]  This visit was conducted in person.  BP 128/64   Pulse (!) 111   Temp 97.9 F (36.6 C) (Oral)   Ht 5' 2.5 (1.588 m)   Wt 135 lb 6.4 oz (61.4 kg)   SpO2 97%   BMI 24.37 kg/m    CC: 2-3 wk f/u visit  Subjective:   HPI: Cassandra Benson is a 83 y.o. female presenting on 09/17/2024 for Medical Management of Chronic Issues (Pt states there is no change from last time//Son Cassandra Benson and DIL Cassandra Benson in room,/Son states Pt has made 2 separate comments about suicide and would like a personality test done)   In process of scheduling testing to complete neurocognitive evaluation.   Cassandra Benson previously saw Dr Evalene Riff PsyD at [facility] for initial neuropsychological evaluation 02/01/2024 - recommend they contact [facility] office at [phone] to inquire about scheduling f/u visits to complete evaluation.   Did not bring sugar log to review.   Notes decreased stamina over the past 2 weeks.   Fructosamine of 403 equivalent to A1c of 10%  Lab Results  Component Value Date   HGBA1C 10.7 (H) 07/25/2024   LADA. Has seen several endocrinologists including Dr Tawni Fendt [facility] and Dr Romero Maillard [facility].  Current diabetic regimen: Tresiba  60u nightly at 10pm (pens)  Lispro (Humalog ) 8-10u before meals (pens)  Unclear if taking insulin  as prescribed.   Ongoing memory impairment concern. She continues aricept  10mg  nightly. She has seen Dr Lane, last seen 12/2023. She refuses to return to see him.   She stopped driving about 3-4 weeks ago. Family has taken her keys. She has accepted this. H/o several episodes of getting lost while driving.   20 lb weight loss over the past 7 months. 5 lb weight gain in the past 2 weeks.   CHF - sees advanced CHF clinic, reviewed latest note and med regimen - she should be on losartan  25mg  daily, isosorbide   mononitrate 30mg  daily, lasix  20mg  daily + K. Both I and son have concerns over her ability to manage her own medications, patient has refused assistance previously offered.      Relevant past medical, surgical, family and social history reviewed and updated as indicated. Interim medical history since our last visit reviewed. Allergies and medications reviewed and updated. Outpatient Medications Prior to Visit  Medication Sig Dispense Refill   acetaminophen  (TYLENOL ) 500 MG tablet Take 500 mg by mouth every 6 (six) hours as needed for mild pain (pain score 1-3) or headache.     albuterol  (VENTOLIN  HFA) 108 (90 Base) MCG/ACT inhaler Inhale 2 puffs into the lungs every 6 (six) hours as needed. 8 g 2   Ascorbic Acid  (VITAMIN C ) 100 MG tablet Take 100 mg by mouth daily.     Blood Glucose Monitoring Suppl (ONE TOUCH ULTRA 2) w/Device KIT SMARTSIG:Via Meter     Blood Glucose Monitoring Suppl (PRODIGY AUTOCODE BLOOD GLUCOSE) DEVI 1 Device by Does not apply route daily.     Cholecalciferol  (VITAMIN D3) 25 MCG (1000 UT) CAPS Take 2 capsules (2,000 Units total) by mouth daily.     clopidogrel  (PLAVIX ) 75 MG tablet TAKE 1 TABLET(75 MG) BY MOUTH EVERY EVENING 30 tablet 11   Continuous Glucose Receiver (FREESTYLE LIBRE 3 READER) DEVI Use to check blood sugar continuously. Z79.4. E13.65 1 each 0  Continuous Glucose Sensor (FREESTYLE LIBRE 3 PLUS SENSOR) MISC Change sensor every 15 days. E13.65, Z79.4 6 each 3   donepezil  (ARICEPT ) 10 MG tablet Take 1 tablet (10 mg total) by mouth at bedtime. 90 tablet 1   Fluticasone-Umeclidin-Vilant (TRELEGY ELLIPTA ) 100-62.5-25 MCG/ACT AEPB Inhale 1 puff into the lungs daily. 60 each 6   furosemide  (LASIX ) 20 MG tablet Take 1 tablet (20 mg total) by mouth daily. 30 tablet 3   Glucagon , rDNA, (GLUCAGON  EMERGENCY) 1 MG KIT INJECT into THE muscle ONCE AS NEEDED FOR emergency 1 kit 12   glucose blood (PRODIGY NO CODING BLOOD GLUC) test strip Use as instructed 100 each 12    insulin  degludec (TRESIBA  FLEXTOUCH) 100 UNIT/ML FlexTouch Pen Inject 60 Units into the skin at bedtime. 18 mL 0   insulin  lispro (HUMALOG ) 100 UNIT/ML injection Inject 0.07 mLs (7 Units total) into the skin 3 (three) times daily before meals. Dose per sliding scale 10 mL 0   isosorbide  mononitrate (IMDUR ) 30 MG 24 hr tablet Take 1 tablet (30 mg total) by mouth daily. 90 tablet 3   Lancets (ONETOUCH DELICA PLUS LANCET33G) MISC Apply topically.     losartan  (COZAAR ) 25 MG tablet Take 1 tablet (25 mg total) by mouth daily. 30 tablet 5   Multiple Vitamin (MULTIVITAMIN ADULT) TABS Take 1 tablet by mouth in the morning and at bedtime.     Multiple Vitamins-Minerals (PRESERVISION AREDS PO) Take 1 tablet by mouth in the morning and at bedtime.     potassium chloride  (KLOR-CON ) 10 MEQ tablet Take 1 tablet (10 mEq total) by mouth daily. 90 tablet 3   rosuvastatin  (CRESTOR ) 20 MG tablet Take 1 tablet (20 mg total) by mouth every evening. 90 tablet 4   SYNTHROID  75 MCG tablet TAKE ONE TABLET BY MOUTH Monday-Saturday BEFORE breakfast 80 tablet 4   nitroGLYCERIN  (NITROSTAT ) 0.4 MG SL tablet Place 1 tablet (0.4 mg total) under the tongue every 5 (five) minutes as needed for chest pain. 25 tablet PRN   No facility-administered medications prior to visit.     Per HPI unless specifically indicated in ROS section below Review of Systems  Objective:  BP 128/64   Pulse (!) 111   Temp 97.9 F (36.6 C) (Oral)   Ht 5' 2.5 (1.588 m)   Wt 135 lb 6.4 oz (61.4 kg)   SpO2 97%   BMI 24.37 kg/m   Wt Readings from Last 3 Encounters:  09/17/24 135 lb 6.4 oz (61.4 kg)  09/03/24 130 lb (59 kg)  08/28/24 141 lb 6.4 oz (64.1 kg)      Physical Exam Vitals and nursing note reviewed.  Constitutional:      Appearance: Normal appearance. She is not ill-appearing.  HENT:     Mouth/Throat:     Mouth: Mucous membranes are moist.     Pharynx: Oropharynx is clear. No oropharyngeal exudate or posterior oropharyngeal  erythema.  Eyes:     Extraocular Movements: Extraocular movements intact.     Pupils: Pupils are equal, round, and reactive to light.  Cardiovascular:     Rate and Rhythm: Normal rate and regular rhythm.     Pulses: Normal pulses.     Heart sounds: Normal heart sounds. No murmur heard. Pulmonary:     Effort: Pulmonary effort is normal. No respiratory distress.     Breath sounds: Normal breath sounds. No wheezing, rhonchi or rales.  Musculoskeletal:     Right lower leg: No edema.     Left  lower leg: No edema.  Skin:    General: Skin is warm and dry.     Findings: No rash.  Neurological:     Mental Status: She is alert.  Psychiatric:        Mood and Affect: Mood normal.        Behavior: Behavior normal.       Results for orders placed or performed in visit on 09/03/24  Basic metabolic panel with GFR   Collection Time: 09/03/24  4:36 PM  Result Value Ref Range   Sodium 137 135 - 145 mEq/L   Potassium 3.5 3.5 - 5.1 mEq/L   Chloride 98 96 - 112 mEq/L   CO2 28 19 - 32 mEq/L   Glucose, Bld 367 (H) 70 - 99 mg/dL   BUN 16 6 - 23 mg/dL   Creatinine, Ser 8.99 0.40 - 1.20 mg/dL   GFR 47.41 (L) >39.99 mL/min   Calcium  9.8 8.4 - 10.5 mg/dL  Fructosamine   Collection Time: 09/03/24  4:36 PM  Result Value Ref Range   Fructosamine 403 (H) 205 - 285 umol/L   *Note: Due to a large number of results and/or encounters for the requested time period, some results have not been displayed. A complete set of results can be found in Results Review.    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 Will discuss [facility] endo referral.  See my prior note for further details.       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  Seems to be doing better with oral medications after family has started filling weekly pill box, family endorses some missed doses in evenings.  Evening pills include: Plavix , donepezil , rosuvastatin  and Preservision multivitamin.  Patient remains frustrated over ongoing medical conditions with impaired insight into limitations.  Family will work towards scheduling completion of neurocognitive/psychological testing in [place] through Dr Woodson office [facility]).       Relevant Orders   Ambulatory referral to Psychology     Meds ordered this encounter  Medications   nitroGLYCERIN  (NITROSTAT ) 0.4 MG SL tablet    Sig: Place 1 tablet (0.4 mg total) under the tongue every 5 (five) minutes as needed for chest pain.    Dispense:  25 tablet    Refill:  1    Orders Placed This Encounter  Procedures   Ambulatory referral to Psychology    Referral Priority:   Routine    Referral Type:   Psychiatric    Referral Reason:   Specialty Services Required    Requested Specialty:   Psychology    Number of Visits Requested:   1    Patient Instructions  Check at home that you're taking isosorbide  30mg  daily. Possible side effect is headache so if that happens take 1/2 tablet daily. See effect on chest pain.  We will work towards scheduling memory evaluation testing in [place].  Return in 1 month for follow up visit.  Continue pill box system.   Follow up plan: Return in about 1 month (around 10/18/2024) for follow up visit.  Anton Blas, MD   "

## 2024-09-18 ENCOUNTER — Encounter: Payer: Self-pay | Admitting: Psychology

## 2024-09-20 NOTE — Assessment & Plan Note (Addendum)
 Weight gain noted. Will discuss [facility] referral.  See my prior note for further details.

## 2024-09-20 NOTE — Assessment & Plan Note (Signed)
 Ongoing concern over memory affecting ability to care for self at home.  Seems to be doing better with oral medications after family has started filling weekly pill box, family endorses some missed doses in evenings.  Evening pills include: Plavix , donepezil , rosuvastatin  and Preservision multivitamin.  Patient remains frustrated over ongoing medical conditions with impaired insight into limitations.  Family will work towards scheduling completion of neurocognitive/psychological testing in [place] through Dr Woodson office [facility]).

## 2024-09-30 ENCOUNTER — Telehealth: Payer: Self-pay

## 2024-09-30 NOTE — Telephone Encounter (Signed)
 Copied from CRM #[id number]. Topic: Clinical - Medical Advice >> Sep 30, 2024  9:45 AM Emylou G wrote: Reason for CRM: Son called.. Pls call him back.. interested in speaking with the nurse on what to do in regards to her cognitve changes and memory.. Pls call him back on guidance

## 2024-10-01 ENCOUNTER — Telehealth: Payer: Self-pay

## 2024-10-01 NOTE — Telephone Encounter (Signed)
 Called Cassandra Benson on cell # - straight to voicemail.  Was able to reach her on home #. She states she remains worried about her diabetes and feels overwhelmed. Encouraged she keep upcoming appts including neuropsychological evaluation.   Spoke with son Adina as well. Mother has become more aggressive, agitated towards him and his wife. Worsening confabulation. Poor eating habits, not taking medications, received medications by pharmacy Friday but today saying she's out of medicines.   Continue to encourage she keep neurocognitive evaluation for next month.

## 2024-10-01 NOTE — Telephone Encounter (Signed)
 Called and spoke with son Oneil and DIL Alan. Gave them contact information for the [facility] [place] . Encouraged them to try and get patient to geriatric psych appointment for a formal evaluation. Currently patient is refusing to go to this appointment. Emotional support given. They were appreciative of the [facility] information.

## 2024-10-01 NOTE — Telephone Encounter (Unsigned)
 Copied from CRM [id number]. Topic: Clinical - Medical Advice >> Oct 01, 2024  8:35 AM Amy B wrote: Reason for CRM: Patient requests a call from Dr. Rilla.  She states she wants to go over some medical issues with him.  [phone]
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
## 2024-10-01 NOTE — Telephone Encounter (Addendum)
 pt called, tearful, wanting to talk to [facility]. she wanted Cassandra Benson to know that she is having cognitive issues and is unable to drive. she wants Cassandra Benson to know that she has a lot of respect for her. she states her memory issues are getting bad and that she is scared. Reassured pt that Cassandra Benson is aware of her cognitive issues. Attempted to remind her of recent appt with Cassandra Benson where issues were discussed. Reminded her of upcoming appt and that her son is aware as he was present at the time the visit was scheduled. Pt had no recollection of visit.

## 2024-10-09 ENCOUNTER — Telehealth: Payer: Self-pay

## 2024-10-09 NOTE — Telephone Encounter (Signed)
 I also checked for any messages, lab results or imaging results showing someone from [facility] tried to contact pt but don't see anything. However, I do see pt has 10/21/24 OV with Dr KANDICE, so may have been a reminder call for appt.

## 2024-10-09 NOTE — Telephone Encounter (Signed)
 Copied from CRM [id number]. Topic: General - Call Back - No Documentation >> Oct 08, 2024 12:46 PM Rea ORN wrote: Reason for CRM: Pt stated she missed a call this afternoon. Pt believes it was PCP. I advised that there wasn't an encounter that someone reached out to her. I checked with [facility] and they didn't know who called her. Pt is requesting a call back from PCP and she said he will know what it is about.  Please call back to advise,  [phone]

## 2024-10-10 NOTE — Telephone Encounter (Signed)
 Called and spoke to pt's son, reelayed thatno call was on our records to pt but that possible reminder call. He states he will remind pt of appt on 10/21/24 at 3pm as well

## 2024-10-21 ENCOUNTER — Ambulatory Visit: Admitting: Family Medicine

## 2024-10-29 ENCOUNTER — Telehealth: Payer: Self-pay | Admitting: Family Medicine

## 2024-10-29 NOTE — Telephone Encounter (Unsigned)
 Copied from CRM #[id number]. Topic: Clinical - Medical Advice >> Oct 29, 2024 10:31 AM Gustabo D wrote: Pt wants a call from Dr. KANDICE and wants to discuss her problems with him. Call back [phone]

## 2024-10-30 NOTE — Telephone Encounter (Signed)
 Copied from CRM #[id number]. Topic: General - Other >> Oct 29, 2024 10:59 AM Antony RAMAN wrote: Reason for CRM: patient calling to make sure dr rilla will call her >> Oct 29, 2024  3:35 PM Deleta RAMAN wrote: Patient returning missed call and would like Dr/ G to know she is sorry for missing his call

## 2024-10-30 NOTE — Telephone Encounter (Signed)
 Unable to reach pt at this time, left voicemail instructing pt to call back to discuss what is going on  Please provide message from provider/office when call is returned from patient.

## 2024-11-01 ENCOUNTER — Ambulatory Visit: Admitting: Family Medicine

## 2024-11-01 ENCOUNTER — Encounter: Payer: Self-pay | Admitting: Family Medicine

## 2024-11-01 VITALS — BP 144/78 | HR 104 | Temp 97.7°F | Ht 62.5 in | Wt 134.0 lb

## 2024-11-01 DIAGNOSIS — E13319 Other specified diabetes mellitus with unspecified diabetic retinopathy without macular edema: Secondary | ICD-10-CM

## 2024-11-01 LAB — POCT GLYCOSYLATED HEMOGLOBIN (HGB A1C): Hemoglobin A1C: 12.8 % — AB (ref 4.0–5.6)

## 2024-11-01 NOTE — Patient Instructions (Addendum)
 I'm glad you are seeing Dr Hayden next week! Keep this appointment.   Continue insulin :  Tresiba  60u nightly at 10pm (pens)  Lispro (Humalog ) 8-10u before meals (pens)   Return in 6-8 weeks for follow up visit.  I will have our pharmacist Manuelita reach out to you again regarding continuous glucose monitor and possible insulin  pump use.

## 2024-11-01 NOTE — Telephone Encounter (Signed)
 Pt seen in office today
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
## 2024-11-04 ENCOUNTER — Encounter: Payer: Self-pay | Admitting: Psychology

## 2024-11-06 ENCOUNTER — Ambulatory Visit: Admitting: Family

## 2024-11-07 ENCOUNTER — Ambulatory Visit: Payer: PPO

## 2024-11-08 ENCOUNTER — Ambulatory Visit

## 2025-02-20 ENCOUNTER — Ambulatory Visit
